# Patient Record
Sex: Female | Born: 1945
Health system: Southern US, Community
[De-identification: ages and names within clinical notes are randomized; demographics above are authoritative.]

## PROBLEM LIST (undated history)

## (undated) DIAGNOSIS — M358 Other specified systemic involvement of connective tissue: Secondary | ICD-10-CM

## (undated) DIAGNOSIS — I4729 Other ventricular tachycardia: Secondary | ICD-10-CM

## (undated) DIAGNOSIS — E785 Hyperlipidemia, unspecified: Secondary | ICD-10-CM

## (undated) DIAGNOSIS — L80 Vitiligo: Secondary | ICD-10-CM

## (undated) DIAGNOSIS — B20 Human immunodeficiency virus [HIV] disease: Secondary | ICD-10-CM

## (undated) DIAGNOSIS — I4510 Unspecified right bundle-branch block: Secondary | ICD-10-CM

## (undated) DIAGNOSIS — S02401A Maxillary fracture, unspecified, initial encounter for closed fracture: Secondary | ICD-10-CM

## (undated) DIAGNOSIS — T783XXA Angioneurotic edema, initial encounter: Secondary | ICD-10-CM

## (undated) DIAGNOSIS — N1832 Chronic kidney disease, stage 3b: Secondary | ICD-10-CM

## (undated) DIAGNOSIS — N390 Urinary tract infection, site not specified: Secondary | ICD-10-CM

## (undated) DIAGNOSIS — J90 Pleural effusion, not elsewhere classified: Secondary | ICD-10-CM

## (undated) DIAGNOSIS — I951 Orthostatic hypotension: Secondary | ICD-10-CM

## (undated) DIAGNOSIS — E1165 Type 2 diabetes mellitus with hyperglycemia: Secondary | ICD-10-CM

## (undated) DIAGNOSIS — L309 Dermatitis, unspecified: Secondary | ICD-10-CM

## (undated) DIAGNOSIS — I071 Rheumatic tricuspid insufficiency: Secondary | ICD-10-CM

## (undated) DIAGNOSIS — Z8669 Personal history of other diseases of the nervous system and sense organs: Secondary | ICD-10-CM

## (undated) DIAGNOSIS — Z889 Allergy status to unspecified drugs, medicaments and biological substances status: Secondary | ICD-10-CM

## (undated) DIAGNOSIS — I63532 Cerebral infarction due to unspecified occlusion or stenosis of left posterior cerebral artery: Secondary | ICD-10-CM

## (undated) DIAGNOSIS — I2510 Atherosclerotic heart disease of native coronary artery without angina pectoris: Secondary | ICD-10-CM

## (undated) DIAGNOSIS — I4892 Unspecified atrial flutter: Secondary | ICD-10-CM

## (undated) DIAGNOSIS — Z8619 Personal history of other infectious and parasitic diseases: Secondary | ICD-10-CM

## (undated) DIAGNOSIS — E1149 Type 2 diabetes mellitus with other diabetic neurological complication: Secondary | ICD-10-CM

## (undated) DIAGNOSIS — I340 Nonrheumatic mitral (valve) insufficiency: Secondary | ICD-10-CM

## (undated) DIAGNOSIS — J189 Pneumonia, unspecified organism: Secondary | ICD-10-CM

## (undated) DIAGNOSIS — I639 Cerebral infarction, unspecified: Secondary | ICD-10-CM

## (undated) DIAGNOSIS — I10 Essential (primary) hypertension: Secondary | ICD-10-CM

## (undated) DIAGNOSIS — L720 Epidermal cyst: Secondary | ICD-10-CM

## (undated) DIAGNOSIS — Z961 Presence of intraocular lens: Secondary | ICD-10-CM

## (undated) DIAGNOSIS — I5030 Unspecified diastolic (congestive) heart failure: Secondary | ICD-10-CM

## (undated) DIAGNOSIS — IMO0001 Reserved for inherently not codable concepts without codable children: Secondary | ICD-10-CM

## (undated) HISTORY — DX: Type 2 diabetes mellitus with hyperglycemia: E11.65

## (undated) HISTORY — DX: Dermatitis, unspecified: L30.9

## (undated) HISTORY — DX: Vitiligo: L80

## (undated) HISTORY — DX: Chronic kidney disease, stage 3b: N18.32

## (undated) HISTORY — DX: Unspecified atrial flutter: I48.92

## (undated) HISTORY — DX: Nonrheumatic mitral (valve) insufficiency: I34.0

## (undated) HISTORY — DX: Personal history of other diseases of the nervous system and sense organs: Z86.69

## (undated) HISTORY — DX: Unspecified right bundle-branch block: I45.10

## (undated) HISTORY — DX: Rheumatic tricuspid insufficiency: I07.1

## (undated) HISTORY — DX: Unspecified diastolic (congestive) heart failure: I50.30

## (undated) HISTORY — DX: Epidermal cyst: L72.0

## (undated) HISTORY — DX: Maxillary fracture, unspecified side, initial encounter for closed fracture: S02.401A

## (undated) HISTORY — PX: CARDIAC SURGERY: SHX584

## (undated) HISTORY — DX: Cerebral infarction due to unspecified occlusion or stenosis of left posterior cerebral artery: I63.532

## (undated) HISTORY — DX: Presence of intraocular lens: Z96.1

## (undated) HISTORY — DX: Cerebral infarction, unspecified: I63.9

## (undated) HISTORY — DX: Hyperlipidemia, unspecified: E78.5

## (undated) HISTORY — DX: Personal history of other infectious and parasitic diseases: Z86.19

## (undated) HISTORY — DX: Other ventricular tachycardia: I47.29

## (undated) HISTORY — DX: Pneumonia, unspecified organism: J18.9

## (undated) HISTORY — DX: Pleural effusion, not elsewhere classified: J90

## (undated) HISTORY — DX: Type 2 diabetes mellitus with other diabetic neurological complication: E11.49

## (undated) HISTORY — DX: Atherosclerotic heart disease of native coronary artery without angina pectoris: I25.10

---
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
## 1997-03-11 HISTORY — PX: ORIF ANKLE FRACTURE: SUR919

## 1998-12-20 ENCOUNTER — Other Ambulatory Visit: Admission: RE | Admit: 1998-12-20 | Discharge: 1998-12-20 | Payer: Self-pay | Admitting: Family Medicine

## 1999-01-09 ENCOUNTER — Encounter: Payer: Self-pay | Admitting: Family Medicine

## 1999-01-09 ENCOUNTER — Encounter: Admission: RE | Admit: 1999-01-09 | Discharge: 1999-01-09 | Payer: Self-pay | Admitting: Family Medicine

## 2000-04-02 ENCOUNTER — Emergency Department (HOSPITAL_COMMUNITY): Admission: EM | Admit: 2000-04-02 | Discharge: 2000-04-02 | Payer: Self-pay | Admitting: Emergency Medicine

## 2003-08-26 ENCOUNTER — Encounter: Admission: RE | Admit: 2003-08-26 | Discharge: 2003-08-26 | Payer: Self-pay | Admitting: Family Medicine

## 2003-09-09 ENCOUNTER — Encounter (INDEPENDENT_AMBULATORY_CARE_PROVIDER_SITE_OTHER): Payer: Self-pay | Admitting: *Deleted

## 2003-09-09 LAB — CONVERTED CEMR LAB

## 2003-09-23 ENCOUNTER — Encounter: Admission: RE | Admit: 2003-09-23 | Discharge: 2003-09-23 | Payer: Self-pay | Admitting: Family Medicine

## 2003-09-28 ENCOUNTER — Encounter: Admission: RE | Admit: 2003-09-28 | Discharge: 2003-09-28 | Payer: Self-pay | Admitting: Sports Medicine

## 2003-11-28 ENCOUNTER — Ambulatory Visit: Payer: Self-pay | Admitting: Family Medicine

## 2006-05-08 DIAGNOSIS — I10 Essential (primary) hypertension: Secondary | ICD-10-CM | POA: Insufficient documentation

## 2006-05-08 DIAGNOSIS — F333 Major depressive disorder, recurrent, severe with psychotic symptoms: Secondary | ICD-10-CM

## 2006-05-08 DIAGNOSIS — N951 Menopausal and female climacteric states: Secondary | ICD-10-CM | POA: Insufficient documentation

## 2006-05-08 DIAGNOSIS — F332 Major depressive disorder, recurrent severe without psychotic features: Secondary | ICD-10-CM | POA: Insufficient documentation

## 2006-05-08 DIAGNOSIS — F331 Major depressive disorder, recurrent, moderate: Secondary | ICD-10-CM | POA: Insufficient documentation

## 2006-05-09 ENCOUNTER — Encounter (INDEPENDENT_AMBULATORY_CARE_PROVIDER_SITE_OTHER): Payer: Self-pay | Admitting: *Deleted

## 2006-08-06 ENCOUNTER — Emergency Department (HOSPITAL_COMMUNITY): Admission: EM | Admit: 2006-08-06 | Discharge: 2006-08-06 | Payer: Self-pay | Admitting: Emergency Medicine

## 2007-09-27 ENCOUNTER — Emergency Department (HOSPITAL_COMMUNITY): Admission: EM | Admit: 2007-09-27 | Discharge: 2007-09-27 | Payer: Self-pay | Admitting: *Deleted

## 2009-07-07 ENCOUNTER — Emergency Department (HOSPITAL_COMMUNITY): Admission: EM | Admit: 2009-07-07 | Discharge: 2009-07-08 | Payer: Self-pay | Admitting: Emergency Medicine

## 2009-07-10 ENCOUNTER — Emergency Department (HOSPITAL_COMMUNITY): Admission: EM | Admit: 2009-07-10 | Discharge: 2009-07-10 | Payer: Self-pay | Admitting: Emergency Medicine

## 2009-07-21 ENCOUNTER — Emergency Department (HOSPITAL_COMMUNITY): Admission: EM | Admit: 2009-07-21 | Discharge: 2009-07-22 | Payer: Self-pay | Admitting: Emergency Medicine

## 2009-10-31 ENCOUNTER — Emergency Department (HOSPITAL_COMMUNITY): Admission: EM | Admit: 2009-10-31 | Discharge: 2009-10-31 | Payer: Self-pay | Admitting: Emergency Medicine

## 2009-11-28 ENCOUNTER — Emergency Department (HOSPITAL_COMMUNITY): Admission: EM | Admit: 2009-11-28 | Discharge: 2009-11-28 | Payer: Self-pay | Admitting: Family Medicine

## 2009-12-10 ENCOUNTER — Emergency Department (HOSPITAL_COMMUNITY): Admission: EM | Admit: 2009-12-10 | Discharge: 2009-12-11 | Payer: Self-pay | Admitting: Emergency Medicine

## 2009-12-31 ENCOUNTER — Emergency Department (HOSPITAL_COMMUNITY): Admission: EM | Admit: 2009-12-31 | Discharge: 2009-12-31 | Payer: Self-pay | Admitting: Emergency Medicine

## 2010-02-15 ENCOUNTER — Emergency Department (HOSPITAL_COMMUNITY): Admission: EM | Admit: 2010-02-15 | Discharge: 2009-06-07 | Payer: Self-pay | Admitting: Emergency Medicine

## 2010-03-11 HISTORY — PX: INTRAOCULAR LENS IMPLANT, SECONDARY: SHX1842

## 2010-05-23 ENCOUNTER — Inpatient Hospital Stay (INDEPENDENT_AMBULATORY_CARE_PROVIDER_SITE_OTHER)
Admission: RE | Admit: 2010-05-23 | Discharge: 2010-05-23 | Disposition: A | Discharge status: Home / Self Care | Payer: Medicare Other | Source: Ambulatory Visit | Attending: Family Medicine | Admitting: Family Medicine

## 2010-05-23 DIAGNOSIS — L989 Disorder of the skin and subcutaneous tissue, unspecified: Secondary | ICD-10-CM

## 2010-05-23 DIAGNOSIS — G43009 Migraine without aura, not intractable, without status migrainosus: Secondary | ICD-10-CM

## 2010-05-29 LAB — BASIC METABOLIC PANEL
BUN: 7 mg/dL (ref 6–23)
BUN: 5 mg/dL — ABNORMAL LOW (ref 6–23)
CO2: 25 mEq/L (ref 19–32)
Chloride: 102 mEq/L (ref 96–112)
Chloride: 107 mEq/L (ref 96–112)
Creatinine, Ser: 0.82 mg/dL (ref 0.4–1.2)
GFR calc Af Amer: >60 mL/min (ref >60)
Potassium: 3.4 mEq/L — ABNORMAL LOW (ref 3.5–5.1)

## 2010-05-29 LAB — CBC
HCT: 41 % (ref 36.0–46.0)
HCT: 42.1 % (ref 36.0–46.0)
MCHC: 34.8 g/dL (ref 30.0–36.0)
MCHC: 35.4 g/dL (ref 30.0–36.0)
MCV: 90.9 fL (ref 78.0–100.0)
MCV: 91.5 fL (ref 78.0–100.0)
MCV: 90.8 fL (ref 78.0–100.0)
Platelets: 204 10*3/uL (ref 150–400)
RBC: 4.51 MIL/uL (ref 3.87–5.11)
RBC: 4.6 MIL/uL (ref 3.87–5.11)
WBC: 13.7 10*3/uL — ABNORMAL HIGH (ref 4.0–10.5)
WBC: 3.5 10*3/uL — ABNORMAL LOW (ref 4.0–10.5)

## 2010-05-29 LAB — URINALYSIS, ROUTINE W REFLEX MICROSCOPIC
Bilirubin Urine: NEGATIVE
Bilirubin Urine: NEGATIVE
Glucose, UA: NEGATIVE mg/dL
Glucose, UA: NEGATIVE mg/dL
Hgb urine dipstick: NEGATIVE
Hgb urine dipstick: NEGATIVE
Ketones, ur: NEGATIVE mg/dL
Ketones, ur: NEGATIVE mg/dL
Nitrite: NEGATIVE
Protein, ur: 30 mg/dL — AB
Specific Gravity, Urine: 1.005 (ref 1.005–1.030)
Urobilinogen, UA: 0.2 mg/dL (ref 0.0–1.0)
pH: 7.5 (ref 5.0–8.0)

## 2010-05-29 LAB — COMPREHENSIVE METABOLIC PANEL
BUN: 7 mg/dL (ref 6–23)
CO2: 27 mEq/L (ref 19–32)
Chloride: 105 mEq/L (ref 96–112)
Creatinine, Ser: 0.72 mg/dL (ref 0.4–1.2)
GFR calc non Af Amer: >60 mL/min (ref >60)
Glucose, Bld: 127 mg/dL — ABNORMAL HIGH (ref 70–99)
Total Bilirubin: 0.4 mg/dL (ref 0.3–1.2)

## 2010-05-29 LAB — DIFFERENTIAL
Basophils Absolute: 0 10*3/uL (ref 0.0–0.1)
Basophils Relative: 1 % (ref 0–1)
Eosinophils Absolute: 0.2 10*3/uL (ref 0.0–0.7)
Eosinophils Absolute: 0.1 10*3/uL (ref 0.0–0.7)
Eosinophils Relative: 1 % (ref 0–5)
Eosinophils Relative: 3 % (ref 0–5)
Lymphocytes Relative: 38 % (ref 12–46)
Lymphs Abs: 2.2 10*3/uL (ref 0.7–4.0)
Monocytes Relative: 7 % (ref 3–12)
Monocytes Relative: 11 % (ref 3–12)
Neutro Abs: 1.6 10*3/uL — ABNORMAL LOW (ref 1.7–7.7)
Neutrophils Relative %: 47 % (ref 43–77)
Neutrophils Relative %: 68 % (ref 43–77)

## 2010-05-29 LAB — URINE MICROSCOPIC-ADD ON

## 2010-05-29 LAB — ROCKY MTN SPOTTED FVR AB, IGM-BLOOD: RMSF IgM: 0.31 IV (ref 0.00–0.89)

## 2010-05-29 LAB — GLUCOSE, CAPILLARY: Glucose-Capillary: 135 mg/dL — ABNORMAL HIGH (ref 70–99)

## 2010-06-02 ENCOUNTER — Emergency Department (HOSPITAL_COMMUNITY)
Admission: EM | Admit: 2010-06-02 | Discharge: 2010-06-02 | Disposition: A | Discharge status: Home / Self Care | Payer: Medicare Other | Attending: Emergency Medicine | Admitting: Emergency Medicine

## 2010-06-02 DIAGNOSIS — I10 Essential (primary) hypertension: Secondary | ICD-10-CM | POA: Insufficient documentation

## 2010-06-02 DIAGNOSIS — R21 Rash and other nonspecific skin eruption: Secondary | ICD-10-CM | POA: Insufficient documentation

## 2010-06-02 DIAGNOSIS — J309 Allergic rhinitis, unspecified: Secondary | ICD-10-CM | POA: Insufficient documentation

## 2010-11-30 ENCOUNTER — Emergency Department (HOSPITAL_COMMUNITY): Payer: Medicare Other

## 2010-11-30 ENCOUNTER — Inpatient Hospital Stay (HOSPITAL_COMMUNITY)
Admission: EM | Admit: 2010-11-30 | Discharge: 2010-12-06 | DRG: 916 | Disposition: A | Discharge status: Home / Self Care | Payer: Medicare Other | Attending: Internal Medicine | Admitting: Internal Medicine

## 2010-11-30 DIAGNOSIS — R51 Headache: Secondary | ICD-10-CM | POA: Diagnosis not present

## 2010-11-30 DIAGNOSIS — I10 Essential (primary) hypertension: Secondary | ICD-10-CM | POA: Diagnosis present

## 2010-11-30 DIAGNOSIS — Z888 Allergy status to other drugs, medicaments and biological substances status: Secondary | ICD-10-CM

## 2010-11-30 DIAGNOSIS — T783XXA Angioneurotic edema, initial encounter: Secondary | ICD-10-CM

## 2010-11-30 DIAGNOSIS — K5900 Constipation, unspecified: Secondary | ICD-10-CM | POA: Diagnosis not present

## 2010-11-30 DIAGNOSIS — Z7982 Long term (current) use of aspirin: Secondary | ICD-10-CM

## 2010-11-30 DIAGNOSIS — X58XXXA Exposure to other specified factors, initial encounter: Secondary | ICD-10-CM

## 2010-11-30 DIAGNOSIS — Z91018 Allergy to other foods: Secondary | ICD-10-CM

## 2010-11-30 DIAGNOSIS — R110 Nausea: Secondary | ICD-10-CM | POA: Diagnosis present

## 2010-11-30 DIAGNOSIS — Z882 Allergy status to sulfonamides status: Secondary | ICD-10-CM

## 2010-11-30 DIAGNOSIS — Z23 Encounter for immunization: Secondary | ICD-10-CM

## 2010-11-30 DIAGNOSIS — Z880 Allergy status to penicillin: Secondary | ICD-10-CM

## 2010-11-30 DIAGNOSIS — R040 Epistaxis: Secondary | ICD-10-CM | POA: Diagnosis not present

## 2010-11-30 DIAGNOSIS — J988 Other specified respiratory disorders: Secondary | ICD-10-CM | POA: Diagnosis present

## 2010-11-30 DIAGNOSIS — Z79899 Other long term (current) drug therapy: Secondary | ICD-10-CM

## 2010-11-30 DIAGNOSIS — I498 Other specified cardiac arrhythmias: Secondary | ICD-10-CM

## 2010-11-30 DIAGNOSIS — R000 Tachycardia, unspecified: Secondary | ICD-10-CM | POA: Diagnosis not present

## 2010-11-30 DIAGNOSIS — E119 Type 2 diabetes mellitus without complications: Secondary | ICD-10-CM | POA: Diagnosis present

## 2010-11-30 LAB — CK TOTAL AND CKMB (NOT AT ARMC): Relative Index: INVALID (ref 0.0–2.5)

## 2010-11-30 LAB — CBC
HCT: 43.2 % (ref 36.0–46.0)
Hemoglobin: 15.7 g/dL — ABNORMAL HIGH (ref 12.0–15.0)
MCV: 85.2 fL (ref 78.0–100.0)
RBC: 5.07 MIL/uL (ref 3.87–5.11)
WBC: 11.1 10*3/uL — ABNORMAL HIGH (ref 4.0–10.5)

## 2010-11-30 LAB — DIFFERENTIAL
Eosinophils Relative: 5 % (ref 0–5)
Lymphocytes Relative: 21 % (ref 12–46)
Lymphs Abs: 2.3 10*3/uL (ref 0.7–4.0)
Neutro Abs: 7.4 10*3/uL (ref 1.7–7.7)
Neutrophils Relative %: 67 % (ref 43–77)

## 2010-11-30 LAB — MRSA PCR SCREENING: MRSA by PCR: NEGATIVE

## 2010-11-30 LAB — BASIC METABOLIC PANEL
CO2: 24 mEq/L (ref 19–32)
Chloride: 102 mEq/L (ref 96–112)
Glucose, Bld: 238 mg/dL — ABNORMAL HIGH (ref 70–99)
Potassium: 3.5 mEq/L (ref 3.5–5.1)
Sodium: 136 mEq/L (ref 135–145)

## 2010-11-30 LAB — GLUCOSE, CAPILLARY
Glucose-Capillary: 353 mg/dL — ABNORMAL HIGH (ref 70–99)
Glucose-Capillary: 323 mg/dL — ABNORMAL HIGH (ref 70–99)

## 2010-11-30 LAB — C-REACTIVE PROTEIN: CRP: 0.48 mg/dL — ABNORMAL LOW (ref <0.60)

## 2010-11-30 LAB — SEDIMENTATION RATE: Sed Rate: 7 mm/hr (ref 0–22)

## 2010-11-30 LAB — POCT I-STAT TROPONIN I

## 2010-12-01 LAB — BASIC METABOLIC PANEL
Calcium: 8.9 mg/dL (ref 8.4–10.5)
Creatinine, Ser: 0.75 mg/dL (ref 0.50–1.10)
GFR calc non Af Amer: >60 mL/min (ref >60)
Glucose, Bld: 328 mg/dL — ABNORMAL HIGH (ref 70–99)
Sodium: 136 mEq/L (ref 135–145)

## 2010-12-01 LAB — GLUCOSE, CAPILLARY
Glucose-Capillary: 310 mg/dL — ABNORMAL HIGH (ref 70–99)
Glucose-Capillary: 282 mg/dL — ABNORMAL HIGH (ref 70–99)
Glucose-Capillary: 337 mg/dL — ABNORMAL HIGH (ref 70–99)

## 2010-12-01 LAB — PHOSPHORUS: Phosphorus: 2.8 mg/dL (ref 2.3–4.6)

## 2010-12-01 LAB — CBC
Hemoglobin: 13.4 g/dL (ref 12.0–15.0)
MCH: 29.6 pg (ref 26.0–34.0)
MCHC: 34.5 g/dL (ref 30.0–36.0)
Platelets: 230 10*3/uL (ref 150–400)
WBC: 15 10*3/uL — ABNORMAL HIGH (ref 4.0–10.5)

## 2010-12-01 LAB — C4 COMPLEMENT: Complement C4, Body Fluid: 22 mg/dL (ref 10–40)

## 2010-12-02 LAB — GLUCOSE, CAPILLARY: Glucose-Capillary: 129 mg/dL — ABNORMAL HIGH (ref 70–99)

## 2010-12-03 LAB — GLUCOSE, CAPILLARY
Glucose-Capillary: 288 mg/dL — ABNORMAL HIGH (ref 70–99)
Glucose-Capillary: 192 mg/dL — ABNORMAL HIGH (ref 70–99)
Glucose-Capillary: 112 mg/dL — ABNORMAL HIGH (ref 70–99)

## 2010-12-04 LAB — GLUCOSE, CAPILLARY: Glucose-Capillary: 151 mg/dL — ABNORMAL HIGH (ref 70–99)

## 2010-12-05 ENCOUNTER — Inpatient Hospital Stay (HOSPITAL_COMMUNITY): Payer: Medicare Other

## 2010-12-05 LAB — GLUCOSE, CAPILLARY: Glucose-Capillary: 95 mg/dL (ref 70–99)

## 2010-12-05 LAB — C1 ESTERASE INHIBITOR, FUNCTIONAL: C1INH Functional/C1INH Total MFr SerPl: 100 % (ref >=68)

## 2010-12-06 DIAGNOSIS — M7989 Other specified soft tissue disorders: Secondary | ICD-10-CM

## 2010-12-06 LAB — GLUCOSE, CAPILLARY
Glucose-Capillary: 196 mg/dL — ABNORMAL HIGH (ref 70–99)
Glucose-Capillary: 128 mg/dL — ABNORMAL HIGH (ref 70–99)

## 2010-12-06 NOTE — Discharge Summary (Addendum)
NAME:  Maria Fox, Maria Fox NO.:  [id number]  MEDICAL RECORD NO.:  [id number]  LOCATION:  5502                         FACILITY:  [facility]  PHYSICIAN:  Lonia Blood, M.D.DATE OF BIRTH:  13-May-1945  DATE OF ADMISSION:  11/30/2010 DATE OF DISCHARGE:                              DISCHARGE SUMMARY   PROJECTED DATE OF DISCHARGE:  December 04, 2010.  PRIMARY CARE PHYSICIAN:  To be assigned at the time of discharge.  DISCHARGE DIAGNOSES: 1. Angioedema/severe acute allergic reaction.     a.     Possibly related to mushrooms/eating pizza.     b.     Symptoms rapidly improved with epinephrine plus      antihistamines plus steroids.     c.     Will need referral to Dr. Maple Hudson in the outpatient setting      for allergy testing to be completed.     d.     EpiPen provided at the time of discharge. 2. Newly diagnosed diabetes mellitus II.     a.     Previously reportedly "borderline diabetic."     b.     Hemoglobin A1c 8.8 during this hospital stay.     c.     Oral therapy initiated and diabetes education ongoing. 3. Newly diagnosed hypertension.     a.     ACE inhibitor not initiated in the setting of acute      angioedema.     b.     Beta-blocker therapy initiated.     c.     Consider ACE inhibitor or ARB and follow-up in the      outpatient setting.  FOLLOWUP: 1. The patient will be referred to a primary care physician at the     time of her discharge as she does not presently have one. 2. The patient will be referred to Dr. Jetty Duhamel at [facility]     Pulmonary for formal allergy testing in the outpatient setting at     the time of her discharge.  PROCEDURES:  None.  CONSULTATIONS:  [facility] - the original admitting service.  HOSPITAL COURSE:  Maria Fox is a very pleasant 65 year old female with no known prior past medical history who does not receive regular medical followup.  The night prior to admission, she went out to  eat with her significant other and consumed a "Supreme Pizza" including mushrooms which she does not usually eat.  She woke the following morning with severe swelling of the face and lips.  She felt as if she was having difficulty with breathing.  She presented to the emergency room.  She was found to be in significant acute angioedema.  She received Benadryl, Decadron, Pepcid, and epinephrine.  Unfortunately she did not require intubation.  She was then admitted to the acute unit under the care of Pulmonary Critical Care Medicine service.  She did well on her initial 24 hours and her facial edema resolved.  She was then transferred to the [facility].  The patient's allergic symptoms rapidly improved and did not recur during her hospital stay. She never progressed to the point of  threatening respiratory compromise again.  She was instructed on use of an EpiPen provided with a prescription for an EpiPen.  She is advised that she will need an outpatient allergy testing and a referral has been made to accomplish this.  During the patient's hospital stay, she was noted to be significantly hyperglycemic.  This was however, in the setting of high-dose steroid administration.  The patient admitted that she had been previously told that she was "borderline diabetic."  She has multiple family members who are diabetic.  Hemoglobin A1c was assessed.  This was found to be 8.8. This of course is not affected by the patient's acute steroid dosing. As a result, we have made a formal diagnosis of diabetes.  A diabetic education has been initiated including nutritional consultation. Glucophage and Glucotrol were initiated during her hospital stay.  She will be provided with prescription for CBG meter and instructed to follow her CBGs on two time a day basis.  She is referred to outpatient diabetes education and case management is currently working on arranging a primary care physician for her.   CBCs are improving nicely as we taper her steroids in the inpatient setting.  During the patient's hospital stay, she was also noted to have hypertension.  Multiple repeat measurements throughout the course of her multi day admission confirm persistent blood pressure elevation.  It should be considered that this is in the setting of high-dose steroids. Nonetheless, it was felt prudent to initiate therapy at this time. Given the patient's recent angioedema, this was felt to be a poor time to initiate ACE inhibitors or ARBs.  As a result, a low-dose beta- blocker has been initiated.  The patient would need to be followed close in the outpatient setting for recheck of her blood pressure.  Once the patient is stable and once her allergy testing is completed, her primary care physician should consider initiating a low-dose ACE inhibitor for its renal protective effects in the setting of diabetes.  At the time of this dictation, the patient is not quite stable for discharge.  She reports she "feels a kind of funny" today.  She specifically denies any stridor or difficulty breathing of feeling like her throat is swelling.  This may simply be a result of her steroid dosing.  At this time, however, it is felt to be prudent to watch her overnight.  If she remained stable throughout the night with a rapid tapering of her steroids, our plan is to discharge her in the morning.  PHYSICAL EXAMINATION:  Physical exam at the time of this dictation, VITAL SIGNS:  Temperature 98.1, blood pressure 145/75, heart rate 65, respiratory rate 70, O2 sat 94% on room air. LUNGS:  Clear to auscultation bilaterally without wheeze or rhonchi. CARDIOVASCULAR:  Regular rate and rhythm without murmur, rub, or gallop. Normal S1 and S2. ABDOMEN:  Nontender, nondistended, soft.  Bowel sounds present.  No organomegaly, rebound or ascites. EXTREMITIES:  There is no significant cyanosis, clubbing, edema in the bilateral  extremities.     Lonia Blood, M.D.     JTM/MEDQ  D:  12/03/2010  T:  12/03/2010  Job:  [id number]  Electronically Signed by Jetty Duhamel M.D. on 12/06/2010 09:53:13 AM Electronically Signed by Jetty Duhamel M.D. on 12/06/2010 09:53:32 AM

## 2010-12-07 LAB — DIFFERENTIAL
Basophils Relative: 2 — ABNORMAL HIGH
Eosinophils Absolute: 0.3
Eosinophils Relative: 4
Lymphs Abs: 2.3
Monocytes Absolute: 0.6
Monocytes Relative: 8
Neutrophils Relative %: 54

## 2010-12-07 LAB — COMPREHENSIVE METABOLIC PANEL
ALT: 14
AST: 17
Albumin: 3.8
Alkaline Phosphatase: 73
Calcium: 9.2
GFR calc Af Amer: >60
Glucose, Bld: 119 — ABNORMAL HIGH
Potassium: 3.4 — ABNORMAL LOW
Sodium: 140
Total Protein: 6.6

## 2010-12-07 LAB — CBC
Hemoglobin: 14.2
MCHC: 35
Platelets: 229
RDW: 13.1

## 2010-12-07 LAB — URINALYSIS, ROUTINE W REFLEX MICROSCOPIC
Bilirubin Urine: NEGATIVE
Ketones, ur: NEGATIVE
Nitrite: NEGATIVE
Urobilinogen, UA: 0.2
pH: 6.5

## 2010-12-14 ENCOUNTER — Emergency Department (HOSPITAL_COMMUNITY)
Admission: EM | Admit: 2010-12-14 | Discharge: 2010-12-14 | Disposition: A | Discharge status: Home / Self Care | Payer: Medicare Other | Attending: Emergency Medicine | Admitting: Emergency Medicine

## 2010-12-14 DIAGNOSIS — G43909 Migraine, unspecified, not intractable, without status migrainosus: Secondary | ICD-10-CM | POA: Insufficient documentation

## 2010-12-14 DIAGNOSIS — Z79899 Other long term (current) drug therapy: Secondary | ICD-10-CM | POA: Insufficient documentation

## 2010-12-18 NOTE — Discharge Summary (Signed)
  NAME:  Maria Fox, Maria Fox                  ACCOUNT NO.:  [id number]  MEDICAL RECORD NO.:  [id number]  LOCATION:  5502                         FACILITY:  [facility]  PHYSICIAN:  Erick Blinks, MD     DATE OF BIRTH:  05/09/1945  DATE OF ADMISSION:  11/30/2010 DATE OF DISCHARGE:  12/06/2010                              DISCHARGE SUMMARY   ADDENDUM  DISCHARGE DIAGNOSES: 1. Angioedema/severe acute allergic reaction. 2. Newly-diagnosed type 2 diabetes. 3. Newly-diagnosed hypertension. 4. Transient nausea, resolved. 5. Headache.  DISCHARGE MEDICATIONS: 1. Metoprolol 12.5 mg p.o. b.i.d. 2. Metformin 500 mg p.o. b.i.d. with meals. 3. Glipizide 10 mg p.o. daily. 4. Enteric-coated aspirin 81 mg p.o. daily. 5. Hydroxyzine 10 mg p.o. daily. 6. Betamethasone topical 0.05%, one application transdermal b.i.d.  INTERIM HOSPITAL COURSE:  The patient's remainder of the hospital course has remained uneventful.  She had, had transient nausea as well as an episode of epistaxis, which resolved spontaneously.  The patient is complaining of some swelling in her right hand today with some pain. This has been occurring since the past 24 hours.  We will check an ultrasound of the upper extremity to rule out any underlying DVT.  If this is negative, she can likely be discharged home to continue further outpatient care.  DISCHARGE INSTRUCTIONS:  The patient already has an appointment to follow up with Dr. Maple Hudson for further allergy testing.  She has been given number for [facility] to schedule an appointment with her primary care doctor of her choice.  She has been advised to continue on a low-salt, low-calorie diet.  She has been set up with outpatient diabetes education.  Activity should be as tolerated.     Erick Blinks, MD     JM/MEDQ  D:  12/06/2010  T:  12/06/2010  Job:  [id number]  Electronically Signed by Durward Mallard Taber Sweetser  on 12/18/2010 10:49:13 PM

## 2011-01-10 ENCOUNTER — Institutional Professional Consult (permissible substitution): Payer: Medicare Other | Admitting: Internal Medicine

## 2011-01-20 ENCOUNTER — Encounter (HOSPITAL_COMMUNITY): Payer: Self-pay | Admitting: *Deleted

## 2011-01-20 ENCOUNTER — Emergency Department (HOSPITAL_COMMUNITY)
Admission: EM | Admit: 2011-01-20 | Discharge: 2011-01-20 | Disposition: A | Discharge status: Home / Self Care | Payer: Medicare Other | Attending: Emergency Medicine | Admitting: Emergency Medicine

## 2011-01-20 DIAGNOSIS — M79609 Pain in unspecified limb: Secondary | ICD-10-CM | POA: Insufficient documentation

## 2011-01-20 DIAGNOSIS — E119 Type 2 diabetes mellitus without complications: Secondary | ICD-10-CM | POA: Insufficient documentation

## 2011-01-20 DIAGNOSIS — L0390 Cellulitis, unspecified: Secondary | ICD-10-CM

## 2011-01-20 DIAGNOSIS — I10 Essential (primary) hypertension: Secondary | ICD-10-CM | POA: Insufficient documentation

## 2011-01-20 DIAGNOSIS — IMO0002 Reserved for concepts with insufficient information to code with codable children: Secondary | ICD-10-CM | POA: Insufficient documentation

## 2011-01-20 DIAGNOSIS — R21 Rash and other nonspecific skin eruption: Secondary | ICD-10-CM

## 2011-01-20 DIAGNOSIS — Z9889 Other specified postprocedural states: Secondary | ICD-10-CM | POA: Insufficient documentation

## 2011-01-20 HISTORY — DX: Essential (primary) hypertension: I10

## 2011-01-20 HISTORY — DX: Allergy status to unspecified drugs, medicaments and biological substances: Z88.9

## 2011-01-20 MED ORDER — CEPHALEXIN 500 MG PO CAPS: 500.0000 mg | ORAL_CAPSULE | Freq: Four times a day (QID) | ORAL | Status: AC

## 2011-01-20 MED ORDER — PREDNISONE 20 MG PO TABS
60.0000 mg | ORAL_TABLET | Freq: Once | ORAL | Status: AC
  Administered 2011-01-20: 60 mg via ORAL

## 2011-01-20 MED ORDER — HYDROXYZINE HCL 25 MG PO TABS: 25.0000 mg | ORAL_TABLET | Freq: Four times a day (QID) | ORAL | Status: AC

## 2011-01-20 MED ORDER — PREDNISONE 20 MG PO TABS: 40.0000 mg | ORAL_TABLET | Freq: Every day | ORAL | Status: AC

## 2011-01-20 MED ORDER — BETAMETHASONE DIPROPIONATE 0.05 % EX OINT: TOPICAL_OINTMENT | Freq: Two times a day (BID) | CUTANEOUS | Status: DC

## 2011-01-20 MED ORDER — DIPHENHYDRAMINE HCL 50 MG/ML IJ SOLN
50.0000 mg | Freq: Once | INTRAMUSCULAR | Status: AC
  Administered 2011-01-20: 50 mg via INTRAMUSCULAR
  Filled 2011-01-20: qty 1

## 2011-01-20 MED ORDER — PREDNISONE 20 MG PO TABS
ORAL_TABLET | ORAL | Status: AC
  Administered 2011-01-20: 60 mg via ORAL
  Filled 2011-01-20: qty 3

## 2011-01-20 NOTE — ED Provider Notes (Signed)
Medical screening examination/treatment/procedure(s) were performed by non-physician practitioner and as supervising physician I was immediately available for consultation/collaboration.   Nicoli Nardozzi D Shahil Speegle, MD 01/20/11 0825 

## 2011-01-20 NOTE — ED Provider Notes (Signed)
History     CSN: [id number] Arrival date & time: 01/20/2011  4:19 AM   First MD Initiated Contact with Patient 01/20/11 0431      Chief Complaint  Patient presents with  . Rash    HPI: Patient is a 65 y.o. female presenting with rash. The history is provided by the patient. No language interpreter was used.  Rash  This is a chronic problem. The current episode started more than 1 week ago. The problem has been rapidly worsening. The problem is associated with nothing. There has been no fever. The rash is present on the torso, right arm, left arm, right upper leg, right lower leg, left upper leg and left lower leg. The pain is at a severity of 10/10. The pain is severe.  Reports intermittent rash x 1 yr. Gets better w/ meds and returns. Returned this time and she was out of the only meds that have helped at all. Has seen mx dermatologist for same w/o definitive dx.  Past Medical History  Diagnosis Date  . Hypertension   . Diabetes mellitus   . Multiple allergies     Past Surgical History  Procedure Date  . Tonsillectomy 1958  . Orthopedic surgery     Family History  Problem Relation Age of Onset  . Hypertension Brother   . Hyperlipidemia Brother   . Asthma Brother   . Throat cancer Brother   . Diabetes type II Mother   . Hypertension Mother   . Hyperlipidemia Mother     History  Substance Use Topics  . Smoking status: Never Smoker   . Smokeless tobacco: Not on file  . Alcohol Use: No    OB History    Grav Para Term Preterm Abortions TAB SAB Ect Mult Living                  Review of Systems  Constitutional: Negative.   HENT: Negative.   Eyes: Negative.   Respiratory: Negative.   Cardiovascular: Negative.   Gastrointestinal: Negative.   Genitourinary: Negative.   Musculoskeletal: Negative.   Skin: Positive for rash.  Neurological: Negative.   Hematological: Negative.   Psychiatric/Behavioral: Negative.     Allergies  Review of patient's allergies  indicates not on file.  Home Medications  No current outpatient prescriptions on file.  BP 136/76  Pulse 96  Temp(Src) 96.8 F (36 C) (Oral)  Resp 18  Ht 5\' 2"  (1.575 m)  Wt 140 lb (63.504 kg)  BMI 25.61 kg/m2  SpO2 95%  Physical Exam  Constitutional: She is oriented to person, place, and time. She appears well-developed and well-nourished.  HENT:  Head: Normocephalic and atraumatic.  Eyes: Conjunctivae are normal.  Neck: Normal range of motion.  Cardiovascular: Normal rate.   Pulmonary/Chest: Effort normal.  Abdominal:       Sparsely scatteredcrusty lesions on abd as well  Musculoskeletal: Normal range of motion.  Neurological: She is alert and oriented to person, place, and time. She has normal reflexes.  Skin: Skin is warm and dry. Rash noted.       Scattered crusty lesions to BUE; BLE, abd and back. Most significant area on (L) posetrior FA w/ very erythematous base c/w early cellulitis.  Psychiatric: She has a normal mood and affect.    ED Course  Procedures Given Benadryl IM for itching, prednisone.  Labs Reviewed - No data to display No results found.   No diagnosis found.    MDM    Nonspecific chronic  rash w/ early cellulitis to (L) posterior FA. Will tx w/ oral steroids, abx and refill Hydroxyzine andDiprolene cream.       Roma Kayser Ailynn Gow, NP 01/20/11 4540  Roma Kayser Francetta Ilg, NP 01/20/11 [phone]

## 2011-01-20 NOTE — ED Notes (Signed)
Patient has coarse rash to her arms, legs, and abd/back.  She states she is now seeing rash in her vaginal area.  Patient was seen recently and told she has psoriasis.  Patient states the cream helped.  She does not believe this is psoriasis.

## 2011-02-28 ENCOUNTER — Institutional Professional Consult (permissible substitution): Payer: Medicare Other | Admitting: Internal Medicine

## 2011-03-04 ENCOUNTER — Emergency Department (HOSPITAL_COMMUNITY)
Admission: EM | Admit: 2011-03-04 | Discharge: 2011-03-05 | Disposition: A | Discharge status: Home / Self Care | Payer: Medicare Other | Attending: Emergency Medicine | Admitting: Emergency Medicine

## 2011-03-04 ENCOUNTER — Encounter (HOSPITAL_COMMUNITY): Payer: Self-pay | Admitting: *Deleted

## 2011-03-04 DIAGNOSIS — L408 Other psoriasis: Secondary | ICD-10-CM | POA: Insufficient documentation

## 2011-03-04 DIAGNOSIS — L509 Urticaria, unspecified: Secondary | ICD-10-CM | POA: Insufficient documentation

## 2011-03-04 DIAGNOSIS — Z79899 Other long term (current) drug therapy: Secondary | ICD-10-CM | POA: Insufficient documentation

## 2011-03-04 DIAGNOSIS — R21 Rash and other nonspecific skin eruption: Secondary | ICD-10-CM | POA: Insufficient documentation

## 2011-03-04 DIAGNOSIS — I10 Essential (primary) hypertension: Secondary | ICD-10-CM | POA: Insufficient documentation

## 2011-03-04 DIAGNOSIS — E119 Type 2 diabetes mellitus without complications: Secondary | ICD-10-CM | POA: Insufficient documentation

## 2011-03-04 DIAGNOSIS — L2989 Other pruritus: Secondary | ICD-10-CM | POA: Insufficient documentation

## 2011-03-04 DIAGNOSIS — L409 Psoriasis, unspecified: Secondary | ICD-10-CM

## 2011-03-04 DIAGNOSIS — L298 Other pruritus: Secondary | ICD-10-CM | POA: Insufficient documentation

## 2011-03-04 LAB — GLUCOSE, CAPILLARY: Glucose-Capillary: 199 mg/dL — ABNORMAL HIGH (ref 70–99)

## 2011-03-04 NOTE — ED Provider Notes (Signed)
Complains of pruritic rash for several years. patient reports having been told by 2 neurologists that she has psoriasis. On exam alert nontoxic Glasgow Coma Score 15 psoriatic rash on trunk arms and perineum. Rash has not appeared infected.  Doug Sou, MD 03/05/11 0000

## 2011-03-04 NOTE — ED Notes (Signed)
Pt complaining of rash in genital area. Pt states rash will not go away even with cream. Pt denies urinary problems.

## 2011-03-04 NOTE — ED Notes (Signed)
Patient with rash all over her body.  Patient has the rash on her arms, lower legs, private area, and her back.  Has been there for some time

## 2011-03-05 MED ORDER — HYDROCORTISONE 1 % EX CREA
TOPICAL_CREAM | Freq: Three times a day (TID) | CUTANEOUS | Status: DC
  Administered 2011-03-05: 02:00:00 via TOPICAL
  Filled 2011-03-05: qty 28

## 2011-03-05 MED ORDER — HYDROXYZINE HCL 25 MG PO TABS: 25.0000 mg | ORAL_TABLET | Freq: Four times a day (QID) | ORAL | Status: DC

## 2011-03-05 NOTE — ED Provider Notes (Signed)
History     CSN: [id number]  Arrival date & time 03/04/11  2203   First MD Initiated Contact with Patient 03/04/11 2250      Chief Complaint  Patient presents with  . Rash    (Consider location/radiation/quality/duration/timing/severity/associated sxs/prior treatment) HPI Comments: Patient here with chronic pruritic rash that has worsened over the past several days - has sen at least 2 dermatologist who both tell the patient that this is psoriasis - she states she does not believe this to be so - she has tried multiple different medications without relief -   Patient is a 65 y.o. female presenting with rash. The history is provided by the patient. No language interpreter was used.  Rash  This is a chronic problem. The current episode started more than 1 week ago. The problem has not changed since onset.The problem is associated with nothing. There has been no fever. The rash is present on the back, abdomen, groin, genitalia, left arm, right arm, right lower leg and left lower leg. The pain is at a severity of 2/10. The pain is mild. The pain has been constant since onset. Associated symptoms include itching. Pertinent negatives include no weeping. She has tried antihistamines and steriods for the symptoms. The treatment provided no relief.    Past Medical History  Diagnosis Date  . Hypertension   . Diabetes mellitus   . Multiple allergies     Past Surgical History  Procedure Date  . Tonsillectomy 1958  . Orthopedic surgery     Family History  Problem Relation Age of Onset  . Hypertension Brother   . Hyperlipidemia Brother   . Asthma Brother   . Throat cancer Brother   . Diabetes type II Mother   . Hypertension Mother   . Hyperlipidemia Mother     History  Substance Use Topics  . Smoking status: Never Smoker   . Smokeless tobacco: Not on file  . Alcohol Use: No    OB History    Grav Para Term Preterm Abortions TAB SAB Ect Mult Living                  Review  of Systems  Skin: Positive for itching and rash.  All other systems reviewed and are negative.    Allergies  Codeine; Penicillins; and Sulfa antibiotics  Home Medications   Current Outpatient Rx  Name Route Sig Dispense Refill  . ACETAMINOPHEN 500 MG PO TABS Oral Take 500 mg by mouth every 6 (six) hours as needed. Pain     . BETAMETHASONE DIPROPIONATE AUG 0.05 % EX GEL Topical Apply 1 application topically 2 (two) times daily.      Marland Kitchen EPINEPHRINE 0.3 MG/0.3ML IJ DEVI Intramuscular Inject 0.3 mg into the muscle once. For allergic reaction     . GLIPIZIDE 10 MG PO TABS Oral Take 10 mg by mouth every morning.      Marland Kitchen HYDROXYZINE HCL 50 MG PO TABS Oral Take 50 mg by mouth every 6 (six) hours as needed. For itching     . IBUPROFEN 200 MG PO TABS Oral Take 200 mg by mouth every 6 (six) hours as needed. Pain     . METFORMIN HCL 500 MG PO TABS Oral Take 500 mg by mouth 2 (two) times daily with a meal.      . METOPROLOL TARTRATE 25 MG PO TABS Oral Take 25 mg by mouth 2 (two) times daily.      Marland Kitchen ONDANSETRON HCL 4 MG  PO TABS Oral Take 4 mg by mouth every 6 (six) hours as needed. For nausea     . PREDNISONE 20 MG PO TABS Oral Take 40 mg by mouth daily.        BP 149/72  Pulse 97  Temp(Src) 97.5 F (36.4 C) (Oral)  Resp 16  Ht 5\' 2"  (1.575 m)  Wt 145 lb (65.772 kg)  BMI 26.52 kg/m2  SpO2 97%  Physical Exam  Nursing note and vitals reviewed. Constitutional: She is oriented to person, place, and time. She appears well-developed and well-nourished. No distress.  HENT:  Head: Normocephalic and atraumatic.  Right Ear: External ear normal.  Left Ear: External ear normal.  Mouth/Throat: Oropharynx is clear and moist. No oropharyngeal exudate.  Eyes: Conjunctivae are normal. Pupils are equal, round, and reactive to light. No scleral icterus.  Neck: Normal range of motion. Neck supple.  Cardiovascular: Normal rate, regular rhythm and normal heart sounds.  Exam reveals no gallop and no friction  rub.   No murmur heard. Pulmonary/Chest: Effort normal and breath sounds normal. No respiratory distress. She exhibits no tenderness.  Abdominal: Soft. Bowel sounds are normal. She exhibits no distension. There is no tenderness.  Musculoskeletal: Normal range of motion.  Lymphadenopathy:    She has no cervical adenopathy.  Neurological: She is alert and oriented to person, place, and time. No cranial nerve deficit.  Skin: Rash noted. Rash is urticarial. There is erythema.          Multiple area of plaque like rash, scale noted to bilateral forearms and bilateral labia, papular rash to back and abdomen with same and erythema to bilateral anterior lower extremities  Psychiatric: She has a normal mood and affect. Her behavior is normal. Judgment and thought content normal.    ED Course  Procedures (including critical care time)  Labs Reviewed  GLUCOSE, CAPILLARY - Abnormal; Notable for the following:    Glucose-Capillary 199 (*)    All other components within normal limits  POCT CBG MONITORING   No results found.   Psoriasis    MDM  Patient returns with worsening of psoriasis without signs of infection to the lower extremities - has been on atarax in the past and would like to try this again - have ordered hydrocortisone cream as well - advised patient against being placed on oral steroids and NSAIDS again due to the risk of renal impairment and GI bleed.        Izola Price [place], [place] 03/05/11 [phone]

## 2011-03-05 NOTE — ED Provider Notes (Signed)
Medical screening examination/treatment/procedure(s) were conducted as a shared visit with non-physician practitioner(s) and myself.  I personally evaluated the patient during the encounter  Doug Sou, MD 03/05/11 [phone]

## 2011-03-14 ENCOUNTER — Emergency Department (HOSPITAL_COMMUNITY)
Admission: EM | Admit: 2011-03-14 | Discharge: 2011-03-15 | Disposition: A | Discharge status: Home / Self Care | Payer: Medicare Other | Attending: Emergency Medicine | Admitting: Emergency Medicine

## 2011-03-14 DIAGNOSIS — R21 Rash and other nonspecific skin eruption: Secondary | ICD-10-CM | POA: Diagnosis not present

## 2011-03-14 DIAGNOSIS — I10 Essential (primary) hypertension: Secondary | ICD-10-CM | POA: Insufficient documentation

## 2011-03-14 DIAGNOSIS — R000 Tachycardia, unspecified: Secondary | ICD-10-CM | POA: Insufficient documentation

## 2011-03-14 DIAGNOSIS — E119 Type 2 diabetes mellitus without complications: Secondary | ICD-10-CM | POA: Insufficient documentation

## 2011-03-14 DIAGNOSIS — Z79899 Other long term (current) drug therapy: Secondary | ICD-10-CM | POA: Insufficient documentation

## 2011-03-14 DIAGNOSIS — L299 Pruritus, unspecified: Secondary | ICD-10-CM | POA: Insufficient documentation

## 2011-03-14 HISTORY — DX: Angioneurotic edema, initial encounter: T78.3XXA

## 2011-03-15 ENCOUNTER — Encounter (HOSPITAL_COMMUNITY): Payer: Self-pay | Admitting: Emergency Medicine

## 2011-03-15 DIAGNOSIS — R21 Rash and other nonspecific skin eruption: Secondary | ICD-10-CM | POA: Diagnosis not present

## 2011-03-15 DIAGNOSIS — R000 Tachycardia, unspecified: Secondary | ICD-10-CM | POA: Diagnosis not present

## 2011-03-15 DIAGNOSIS — Z79899 Other long term (current) drug therapy: Secondary | ICD-10-CM | POA: Diagnosis not present

## 2011-03-15 DIAGNOSIS — I10 Essential (primary) hypertension: Secondary | ICD-10-CM | POA: Diagnosis not present

## 2011-03-15 DIAGNOSIS — E119 Type 2 diabetes mellitus without complications: Secondary | ICD-10-CM | POA: Diagnosis not present

## 2011-03-15 DIAGNOSIS — L299 Pruritus, unspecified: Secondary | ICD-10-CM | POA: Diagnosis not present

## 2011-03-15 MED ORDER — PREDNISONE 10 MG PO TABS: 20.0000 mg | ORAL_TABLET | Freq: Every day | ORAL | Status: DC

## 2011-03-15 MED ORDER — HYDROXYZINE HCL 50 MG/ML IM SOLN
50.0000 mg | Freq: Once | INTRAMUSCULAR | Status: AC
  Administered 2011-03-15: 50 mg via INTRAMUSCULAR
  Filled 2011-03-15: qty 1

## 2011-03-15 NOTE — ED Notes (Signed)
Pt st's she has a rash all over her body for several months, st's it's getting worse and worse.  Pt was given betamethasone cream in September, st's it doesn't help.  She has seen a dermatologist and they told her they think it's psoriasis.

## 2011-03-15 NOTE — ED Provider Notes (Signed)
History     CSN: [id number]  Arrival date & time 03/14/11  2335   First MD Initiated Contact with Patient 03/15/11 0139      Chief Complaint  Patient presents with  . Rash    (Consider location/radiation/quality/duration/timing/severity/associated sxs/prior treatment) Patient is a 66 y.o. female presenting with rash. The history is provided by the patient.  Rash    patient here with rash to her back and lower extremities for the past 10 months. Patient has been seen by dermatology x2 for same and no diagnosis made. Patient notes that she has pruritus with it and burning. Denies any fever. No progression of the rash. Nothing makes her symptoms better or worse. No medications used prior to arrival  Past Medical History  Diagnosis Date  . Hypertension   . Diabetes mellitus   . Multiple allergies   . Angioedema     Past Surgical History  Procedure Date  . Tonsillectomy 1958  . Orthopedic surgery     Family History  Problem Relation Age of Onset  . Hypertension Brother   . Hyperlipidemia Brother   . Asthma Brother   . Throat cancer Brother   . Diabetes type II Mother   . Hypertension Mother   . Hyperlipidemia Mother     History  Substance Use Topics  . Smoking status: Never Smoker   . Smokeless tobacco: Not on file  . Alcohol Use: No    OB History    Grav Para Term Preterm Abortions TAB SAB Ect Mult Living                  Review of Systems  Skin: Positive for rash.  All other systems reviewed and are negative.    Allergies  Codeine; Penicillins; and Sulfa antibiotics  Home Medications   Current Outpatient Rx  Name Route Sig Dispense Refill  . ACETAMINOPHEN 500 MG PO TABS Oral Take 500 mg by mouth every 6 (six) hours as needed. Pain     . BETAMETHASONE DIPROPIONATE AUG 0.05 % EX GEL Topical Apply 1 application topically 2 (two) times daily.      Marland Kitchen GLIPIZIDE 10 MG PO TABS Oral Take 10 mg by mouth every morning.      Marland Kitchen HYDROXYZINE HCL 50 MG PO TABS  Oral Take 50 mg by mouth every 6 (six) hours as needed. For itching     . IBUPROFEN 200 MG PO TABS Oral Take 200 mg by mouth every 6 (six) hours as needed. Pain     . METFORMIN HCL 500 MG PO TABS Oral Take 500 mg by mouth 2 (two) times daily with a meal.      . METOPROLOL TARTRATE 25 MG PO TABS Oral Take 25 mg by mouth 2 (two) times daily.      Marland Kitchen EPINEPHRINE 0.3 MG/0.3ML IJ DEVI Intramuscular Inject 0.3 mg into the muscle once. For allergic reaction     . ONDANSETRON HCL 4 MG PO TABS Oral Take 4 mg by mouth every 6 (six) hours as needed. For nausea       BP 156/81  Pulse 111  Temp(Src) 98.2 F (36.8 C) (Oral)  Resp 20  Wt 145 lb (65.772 kg)  SpO2 97%  Physical Exam  Nursing note and vitals reviewed. Constitutional: She is oriented to person, place, and time. She appears well-developed and well-nourished.  Non-toxic appearance.  HENT:  Head: Normocephalic and atraumatic.  Eyes: Conjunctivae are normal. Pupils are equal, round, and reactive to light.  Neck: Normal range of motion.  Cardiovascular: Tachycardia present.   Pulmonary/Chest: Effort normal.  Neurological: She is alert and oriented to person, place, and time.  Skin: Skin is warm and dry. Rash noted. No petechiae and no purpura noted. Rash is pustular. Rash is not papular, not nodular and not urticarial.  Psychiatric: She has a normal mood and affect.    ED Course  Procedures (including critical care time)  Labs Reviewed - No data to display No results found.   No diagnosis found.    MDM  She given dose of IM Vistaril here. Will place on short prednisone taper and she will followup with dermatology at [facility]        Toy Baker, MD 03/15/11 [phone]

## 2011-03-15 NOTE — ED Notes (Signed)
Pt is c/o rash on her back and her legs, arms  Pt states she has had it for a while now  Pt states it burns and hurts  Pt states it is getting worse  Pt states she has been to a dermatologist  But it continues to get worse

## 2011-05-27 ENCOUNTER — Encounter (HOSPITAL_COMMUNITY): Payer: Self-pay | Admitting: *Deleted

## 2011-05-27 ENCOUNTER — Observation Stay (HOSPITAL_COMMUNITY)
Admission: EM | Admit: 2011-05-27 | Discharge: 2011-05-27 | Disposition: A | Discharge status: Home / Self Care | Payer: Medicare Other | Attending: Emergency Medicine | Admitting: Emergency Medicine

## 2011-05-27 DIAGNOSIS — T783XXA Angioneurotic edema, initial encounter: Principal | ICD-10-CM | POA: Insufficient documentation

## 2011-05-27 DIAGNOSIS — T7840XA Allergy, unspecified, initial encounter: Secondary | ICD-10-CM

## 2011-05-27 DIAGNOSIS — I10 Essential (primary) hypertension: Secondary | ICD-10-CM | POA: Insufficient documentation

## 2011-05-27 DIAGNOSIS — E119 Type 2 diabetes mellitus without complications: Secondary | ICD-10-CM | POA: Diagnosis not present

## 2011-05-27 DIAGNOSIS — X58XXXA Exposure to other specified factors, initial encounter: Secondary | ICD-10-CM | POA: Insufficient documentation

## 2011-05-27 LAB — POCT I-STAT, CHEM 8
Creatinine, Ser: 0.8 mg/dL (ref 0.50–1.10)
HCT: 40 % (ref 36.0–46.0)
Hemoglobin: 13.6 g/dL (ref 12.0–15.0)
Potassium: 3.9 mEq/L (ref 3.5–5.1)
Sodium: 139 mEq/L (ref 135–145)
TCO2: 25 mmol/L (ref 0–100)

## 2011-05-27 MED ORDER — EPINEPHRINE 0.3 MG/0.3ML IJ DEVI
0.3000 mg | Freq: Once | INTRAMUSCULAR | Status: AC
  Administered 2011-05-27: 0.3 mg via INTRAMUSCULAR
  Filled 2011-05-27: qty 0.3

## 2011-05-27 MED ORDER — FAMOTIDINE 20 MG PO TABS
ORAL_TABLET | ORAL | Status: AC
  Filled 2011-05-27: qty 1

## 2011-05-27 MED ORDER — PREDNISONE 20 MG PO TABS
60.0000 mg | ORAL_TABLET | Freq: Once | ORAL | Status: AC
  Administered 2011-05-27: 60 mg via ORAL
  Filled 2011-05-27: qty 3

## 2011-05-27 MED ORDER — EPINEPHRINE 0.3 MG/0.3ML IJ DEVI: 0.3000 mg | INTRAMUSCULAR | Status: DC | PRN

## 2011-05-27 MED ORDER — DIPHENHYDRAMINE HCL 50 MG/ML IJ SOLN
25.0000 mg | Freq: Four times a day (QID) | INTRAMUSCULAR | Status: DC | PRN
  Administered 2011-05-27: 50 mg via INTRAVENOUS
  Filled 2011-05-27: qty 1

## 2011-05-27 MED ORDER — DIPHENHYDRAMINE HCL 50 MG/ML IJ SOLN
50.0000 mg | Freq: Once | INTRAMUSCULAR | Status: AC
  Administered 2011-05-27: 50 mg via INTRAVENOUS
  Filled 2011-05-27: qty 1

## 2011-05-27 MED ORDER — FAMOTIDINE 20 MG PO TABS
20.0000 mg | ORAL_TABLET | Freq: Once | ORAL | Status: AC
  Administered 2011-05-27: 20 mg via ORAL

## 2011-05-27 MED ORDER — FAMOTIDINE IN NACL 20-0.9 MG/50ML-% IV SOLN: 20.0000 mg | Freq: Two times a day (BID) | INTRAVENOUS | Status: DC

## 2011-05-27 NOTE — ED Notes (Signed)
The pt has multiple complaints she has had a virus and this am her tongue was swollen but that went away.  She has a generalized rash and and she has chest congestion.

## 2011-05-27 NOTE — ED Provider Notes (Signed)
Pt seen with NP She is here for angioedema of lips, No tongue swelling noted She has h/o similar type reaction in past Will give epi/benadryl/steroids Will place on CDU protocol No stridor, voice normal  Fam hx - positive for CAD  Joya Gaskins, MD 05/27/11 [phone]

## 2011-05-27 NOTE — ED Notes (Signed)
DR Adriana Simas HAS BEEN IN TO [facility] FOR DISCHARGE HOME. PT IS CONCERNED ABOUT EATING DINNER HERE BEFORE [facility] GOES HOME

## 2011-05-27 NOTE — ED Provider Notes (Signed)
History     CSN: [id number]  Arrival date & time 05/27/11  Rich Fuchs   First MD Initiated Contact with Patient 05/27/11 [phone]      Chief Complaint  Patient presents with  . Nasal Congestion     Patient is a 66 y.o. female presenting with allergic reaction. The history is provided by the patient.  Allergic Reaction The primary symptoms are  angioedema. The primary symptoms do not include wheezing or shortness of breath. The current episode started 13 to 24 hours ago. The problem has been gradually improving. This is a recurrent problem.  The angioedema is not associated with shortness of breath.   Pt reports she awoke yesterday morning with a swollen tongue. This seemed to resolve over the course of the day. At approximately 9 clock last night patient noted swelling to right upper lip. Denies any new exposures. Patient with history of similar reactions in the past and has been diagnosed with angioedema. States that she has been prescribed an EpiPen but did not have her EpiPen with her. She denies having any difficulty breathing at any point or any other associated symptoms. Patient does have a rash to bilateral anterior forearms which is chronic.  Past Medical History  Diagnosis Date  . Hypertension   . Diabetes mellitus   . Multiple allergies   . Angioedema     Past Surgical History  Procedure Date  . Tonsillectomy 1958  . Orthopedic surgery     Family History  Problem Relation Age of Onset  . Hypertension Brother   . Hyperlipidemia Brother   . Asthma Brother   . Throat cancer Brother   . Diabetes type II Mother   . Hypertension Mother   . Hyperlipidemia Mother     History  Substance Use Topics  . Smoking status: Never Smoker   . Smokeless tobacco: Not on file  . Alcohol Use: No    OB History    Grav Para Term Preterm Abortions TAB SAB Ect Mult Living                  Review of Systems  Constitutional: Negative.   HENT: Negative.   Eyes: Negative.     Respiratory: Negative.  Negative for shortness of breath and wheezing.   Cardiovascular: Negative.   Gastrointestinal: Negative.   Genitourinary: Negative.   Musculoskeletal: Negative.   Skin: Negative.   Neurological: Negative.   Hematological: Negative.   Psychiatric/Behavioral: Negative.     Allergies  Codeine; Penicillins; and Sulfa antibiotics  Home Medications   Current Outpatient Rx  Name Route Sig Dispense Refill  . ACETAMINOPHEN 500 MG PO TABS Oral Take 500 mg by mouth every 6 (six) hours as needed. Pain     . BETAMETHASONE DIPROPIONATE AUG 0.05 % EX GEL Topical Apply 1 application topically 2 (two) times daily.      Marland Kitchen GLIPIZIDE 10 MG PO TABS Oral Take 10 mg by mouth every morning.      Marland Kitchen HYDROXYZINE HCL 50 MG PO TABS Oral Take 50 mg by mouth every 6 (six) hours as needed. For itching     . IBUPROFEN 200 MG PO TABS Oral Take 200 mg by mouth every 6 (six) hours as needed. Pain     . METFORMIN HCL 500 MG PO TABS Oral Take 500 mg by mouth 2 (two) times daily with a meal.      . METOPROLOL TARTRATE 25 MG PO TABS Oral Take 25 mg by mouth 2 (two) times  daily.      Marland Kitchen ONDANSETRON HCL 4 MG PO TABS Oral Take 4 mg by mouth every 6 (six) hours as needed. For nausea     . EPINEPHRINE 0.3 MG/0.3ML IJ DEVI Intramuscular Inject 0.3 mg into the muscle once. For allergic reaction       BP 164/87  Pulse 107  Temp(Src) 98.2 F (36.8 C) (Oral)  Resp 22  SpO2 97%  Physical Exam  Constitutional: She is oriented to person, place, and time. She appears well-developed and well-nourished.  HENT:  Head: Normocephalic and atraumatic.  Mouth/Throat:         Mild swelling to right upper lip  Eyes: Conjunctivae are normal.  Neck: Neck supple.  Cardiovascular: Normal rate and regular rhythm.   Pulmonary/Chest: Effort normal and breath sounds normal.  Abdominal: Soft. Bowel sounds are normal.  Musculoskeletal: Normal range of motion.  Neurological: She is alert and oriented to person,  place, and time.  Skin: Skin is warm and dry. No erythema.  Psychiatric: She has a normal mood and affect.    ED Course  Procedures   I have discussed patient with Dr. Bebe Shaggy who has also seen and examined patient. Will plan to place patient in CDU under "CDU Allergic Reaction Protocol for continued observation and reevaluation. I discussed this plan with the patient and she is agreeable.  Labs Reviewed  POCT I-STAT, CHEM 8 - Abnormal; Notable for the following:    Glucose, Bld 232 (*)    All other components within normal limits   No results found.   No diagnosis found.    MDM  HPI/PE and clinical findings c/w 1. Angiedema  Will place in CDU under the "CDU Allergic Reaction Protocol" for continued observation and reevaluation.        Leanne Chang, NP 05/27/11 [phone]

## 2011-05-27 NOTE — Discharge Instructions (Signed)
Allergic Reaction, Mild to Moderate   Allergies may happen from anything your body is sensitive to. This may be food, medications, pollens, chemicals, and nearly anything around you in everyday life that produces allergens. An allergen is anything that causes an allergy producing substance. Allergens cause your body to release allergic antibodies. Through a chain of events, they cause a release of histamine into the blood stream. Histamines are meant to protect you, but they also cause your discomfort. This is why antihistamines are often used for allergies. Heredity is often a factor in causing allergic reactions. This means you may have some of the same allergies as your parents.   Allergies happen in all age groups. You may have some idea of what caused your reaction. There are many allergens around us. It may be difficult to know what caused your reaction. If this is a first time event, it may never happen again. Allergies cannot be cured but can be controlled with medications.   SYMPTOMS   You may get some or all of the following problems from allergies.   Swelling and itching in and around the mouth.   Tearing, itchy eyes.   Nasal congestion and runny nose.   Sneezing and coughing.   An itchy red rash or hives.   Vomiting or diarrhea.   Difficulty breathing.  Seasonal allergies occur in all age groups. They are seasonal because they usually occur during the same season every year. They may be a reaction to molds, grass pollens, or tree pollens. Other causes of allergies are house dust mite allergens, pet dander and mold spores. These are just a common few of the thousands of allergens around us. All of the symptoms listed above happen when you come in contact with pollens and other allergens. Seasonal allergies are usually not life threatening. They are generally more of a nuisance that can often be handled using medications.   Hay fever is a combination of all or some of the above listed allergy problems. It  may often be treated with simple over-the-counter medications such as diphenhydramine. Take medication as directed. Check with your caregiver or package insert for child dosages.   TREATMENT AND HOME CARE INSTRUCTIONS   If hives or rash are present:   Take medications as directed.   You may use an over-the-counter antihistamine (diphenhydramine) for hives and itching as needed. Do not drive or drink alcohol until medications used to treat the reaction have worn off. Antihistamines tend to make people sleepy.   Apply cold cloths (compresses) to the skin or take baths in cool water. This will help itching. Avoid hot baths or showers. Heat will make a rash and itching worse.   If your allergies persist and become more severe, and over the counter medications are not effective, there are many new medications your caretaker can prescribe. Immunotherapy or desensitizing injections can be used if all else fails. Follow up with your caregiver if problems continue.  SEEK MEDICAL CARE IF:   Your allergies are becoming progressively more troublesome.   You suspect a food allergy. Symptoms generally happen within 30 minutes of eating a food.   Your symptoms have not gone away within 2 days or are getting worse.   You develop new symptoms.   You want to retest yourself or your child with a food or drink you think causes an allergic reaction. Never test yourself or your child of a suspected allergy without being under the watchful eye of your caregivers. A second   exposure to an allergen may be life-threatening.  SEEK IMMEDIATE MEDICAL CARE IF:   You develop difficulty breathing or wheezing, or have a tight feeling in your chest or throat.   You develop a swollen mouth, hives, swelling, or itching all over your body.  A severe reaction with any of the above problems should be considered life-threatening. If you suddenly develop difficulty breathing call for local emergency medical help. THIS IS AN EMERGENCY.   MAKE SURE YOU:    Understand these instructions.   Will watch your condition.   Will get help right away if you are not doing well or get worse.  Document Released: 12/23/2006 Document Revised: 02/14/2011 Document Reviewed: 12/23/2006   [employer] Patient Information 2012 [employer]

## 2011-05-27 NOTE — Progress Notes (Signed)
Observation review is complete. 

## 2011-05-27 NOTE — ED Notes (Signed)
PT CONTINUES EATING MEAL TRAY. SHE HAS BEEN GIVEN 6 ORANGE JUICES . SHE HAS BEEN GIVEN HER DISCHARGE INSTRUCTIONS. WAITING FOR HER TO FINISH HER MEAL SO SHE CAN DRESS AND GO HOME

## 2011-05-27 NOTE — ED Provider Notes (Signed)
Medical screening examination/treatment/procedure(s) were conducted as a shared visit with non-physician practitioner(s) and myself.  I personally evaluated the patient during the encounter  Pt observed in CDU she has no tongue involvement, no stridor, neck supple Continue on allergic rxn protocol  Joya Gaskins, MD 05/27/11 [phone]

## 2011-05-27 NOTE — ED Provider Notes (Signed)
Medical screening examination/treatment/procedure(s) were conducted as a shared visit with non-physician practitioner(s) and myself.  I personally evaluated the patient during the encounter.  History and physical consistent with idiopathic allergic reaction. Patient feeling much better at discharge. Decreased lip and tongue swelling. Good airway   Donnetta Hutching, MD 05/27/11 2350

## 2011-05-27 NOTE — ED Provider Notes (Signed)
Patient in CDU under allergic reaction protocol.  Patient's symptoms have improved. Minimal swelling of lower lip persists.  No swelling of tongue.  Lungs CTA bilaterally.   5:38 PM Patient has been seen by Dr. Adriana Simas.  Plan to d/c home with prescription for epi-pen.  Continue with po benadryl at home.  Jimmye Norman, NP 05/27/11 1740

## 2011-06-13 ENCOUNTER — Emergency Department (HOSPITAL_COMMUNITY)
Admission: EM | Admit: 2011-06-13 | Discharge: 2011-06-13 | Disposition: A | Discharge status: Home / Self Care | Payer: Medicare Other | Attending: Emergency Medicine | Admitting: Emergency Medicine

## 2011-06-13 ENCOUNTER — Emergency Department (HOSPITAL_COMMUNITY): Payer: Medicare Other

## 2011-06-13 ENCOUNTER — Encounter (HOSPITAL_COMMUNITY): Payer: Self-pay | Admitting: Emergency Medicine

## 2011-06-13 DIAGNOSIS — T783XXA Angioneurotic edema, initial encounter: Secondary | ICD-10-CM

## 2011-06-13 DIAGNOSIS — R062 Wheezing: Secondary | ICD-10-CM | POA: Insufficient documentation

## 2011-06-13 DIAGNOSIS — E119 Type 2 diabetes mellitus without complications: Secondary | ICD-10-CM | POA: Diagnosis not present

## 2011-06-13 DIAGNOSIS — I10 Essential (primary) hypertension: Secondary | ICD-10-CM | POA: Insufficient documentation

## 2011-06-13 DIAGNOSIS — R609 Edema, unspecified: Secondary | ICD-10-CM | POA: Diagnosis not present

## 2011-06-13 DIAGNOSIS — R220 Localized swelling, mass and lump, head: Secondary | ICD-10-CM | POA: Insufficient documentation

## 2011-06-13 DIAGNOSIS — Z79899 Other long term (current) drug therapy: Secondary | ICD-10-CM | POA: Diagnosis not present

## 2011-06-13 DIAGNOSIS — R0602 Shortness of breath: Secondary | ICD-10-CM | POA: Diagnosis not present

## 2011-06-13 DIAGNOSIS — R5383 Other fatigue: Secondary | ICD-10-CM | POA: Diagnosis not present

## 2011-06-13 DIAGNOSIS — R5381 Other malaise: Secondary | ICD-10-CM | POA: Diagnosis not present

## 2011-06-13 DIAGNOSIS — X58XXXA Exposure to other specified factors, initial encounter: Secondary | ICD-10-CM | POA: Insufficient documentation

## 2011-06-13 DIAGNOSIS — R221 Localized swelling, mass and lump, neck: Secondary | ICD-10-CM | POA: Insufficient documentation

## 2011-06-13 MED ORDER — DIPHENHYDRAMINE HCL 25 MG PO CAPS: 50.0000 mg | ORAL_CAPSULE | Freq: Four times a day (QID) | ORAL | Status: DC | PRN

## 2011-06-13 MED ORDER — SODIUM CHLORIDE 0.9 % IV SOLN
Freq: Once | INTRAVENOUS | Status: AC
  Administered 2011-06-13: 04:00:00 via INTRAVENOUS

## 2011-06-13 MED ORDER — DEXAMETHASONE SODIUM PHOSPHATE 10 MG/ML IJ SOLN
10.0000 mg | Freq: Once | INTRAMUSCULAR | Status: AC
  Administered 2011-06-13: 10 mg via INTRAVENOUS
  Filled 2011-06-13: qty 1

## 2011-06-13 MED ORDER — DIPHENHYDRAMINE HCL 50 MG/ML IJ SOLN
50.0000 mg | Freq: Once | INTRAMUSCULAR | Status: AC
  Administered 2011-06-13: 50 mg via INTRAVENOUS
  Filled 2011-06-13: qty 1

## 2011-06-13 MED ORDER — FAMOTIDINE IN NACL 20-0.9 MG/50ML-% IV SOLN
20.0000 mg | Freq: Once | INTRAVENOUS | Status: AC
  Administered 2011-06-13: 20 mg via INTRAVENOUS
  Filled 2011-06-13: qty 50

## 2011-06-13 MED ORDER — FENTANYL CITRATE 0.05 MG/ML IJ SOLN
50.0000 ug | Freq: Once | INTRAMUSCULAR | Status: AC
  Administered 2011-06-13: 50 ug via INTRAVENOUS
  Filled 2011-06-13: qty 2

## 2011-06-13 NOTE — ED Notes (Signed)
Dr. Read Drivers to bedside.

## 2011-06-13 NOTE — ED Notes (Signed)
Pt states she has angioedema  Pt states she does not know what causes this but has hx of same  Pt has swelling noted to her face and states she feels like her throat is swelling and feels like she is wheezing  Pt states she has been hospitalized twice recently for same

## 2011-06-13 NOTE — ED Provider Notes (Signed)
History     CSN: [id number]  Arrival date & time 06/13/11  0045   First MD Initiated Contact with Patient 06/13/11 [phone]      Chief Complaint  Patient presents with  . Shortness of Breath    (Consider location/radiation/quality/duration/timing/severity/associated sxs/prior treatment) HPI This is a 66 year old white female with a history of angioedema of undetermined etiology as well as an unspecified autoimmune dermatitis. She is here with swelling of the left side of her lips since yesterday afternoon. The onset was fairly acute. She initially had some sensation that her throat was swelling and that she was wheezing but those have resolved. The swelling in her lips have improved despite not having taken any medication for this. The symptoms are moderate at their worst and mild at the present time. She is also complaining of a "fiery" sensation and itching of the plaques of her chronic rash.  Past Medical History  Diagnosis Date  . Hypertension   . Diabetes mellitus   . Multiple allergies   . Angioedema     Past Surgical History  Procedure Date  . Tonsillectomy 1958  . Orthopedic surgery     Family History  Problem Relation Age of Onset  . Hypertension Brother   . Hyperlipidemia Brother   . Asthma Brother   . Throat cancer Brother   . Diabetes type II Mother   . Hypertension Mother   . Hyperlipidemia Mother     History  Substance Use Topics  . Smoking status: Never Smoker   . Smokeless tobacco: Not on file  . Alcohol Use: No    OB History    Grav Para Term Preterm Abortions TAB SAB Ect Mult Living                  Review of Systems  All other systems reviewed and are negative.    Allergies  Codeine; Penicillins; and Sulfa antibiotics  Home Medications   Current Outpatient Rx  Name Route Sig Dispense Refill  . ACETAMINOPHEN 500 MG PO TABS Oral Take 500 mg by mouth every 6 (six) hours as needed. Pain     . BETAMETHASONE DIPROPIONATE AUG 0.05 % EX GEL  Topical Apply 1 application topically 2 (two) times daily.      Marland Kitchen DIPHENHYDRAMINE HCL 25 MG PO TABS Oral Take 25 mg by mouth every 6 (six) hours as needed. Itching for arm rash    . EPINEPHRINE 0.3 MG/0.3ML IJ DEVI Intramuscular Inject 0.3 mLs (0.3 mg total) into the muscle as needed. 2 Device 0  . GLIPIZIDE 10 MG PO TABS Oral Take 10 mg by mouth every morning.      Marland Kitchen HYDROXYZINE HCL 50 MG PO TABS Oral Take 50 mg by mouth every 6 (six) hours as needed. For itching     . IBUPROFEN 200 MG PO TABS Oral Take 200 mg by mouth every 6 (six) hours as needed. Pain     . METFORMIN HCL 500 MG PO TABS Oral Take 500 mg by mouth 2 (two) times daily with a meal.      . METOPROLOL TARTRATE 25 MG PO TABS Oral Take 25 mg by mouth 2 (two) times daily.      Marland Kitchen ONDANSETRON HCL 4 MG PO TABS Oral Take 4 mg by mouth every 6 (six) hours as needed. For nausea       BP 170/94  Pulse 101  Temp(Src) 97.5 F (36.4 C) (Oral)  Resp 20  SpO2 93%  Physical Exam  General: Well-developed, well-nourished female in no acute distress; appearance consistent with age of record HENT: normocephalic, atraumatic; no pharyngeal edema; no edema of tongue; mild angioedema of left side of lips Eyes: pupils equal round and reactive to light; extraocular muscles intact Neck: supple; no stridor; no dysphonia Heart: regular rate and rhythm Lungs: clear to auscultation bilaterally Abdomen: soft; nondistended; bowel sounds present Extremities: No deformity; full range of motion; trace edema of lower legs Neurologic: Awake, alert and oriented; motor function intact in all extremities and symmetric; no facial droop Skin: Patchy hypopigmentation; scattered inflammatory patches with overlying excoriations     ED Course  Procedures (including critical care time)     MDM  6:01 AM Sleeping comfortably after IV meds. Angioedema nearly resolved.          Hanley Seamen, MD 06/13/11 [phone]

## 2011-06-13 NOTE — Discharge Instructions (Signed)
Angioedema Angioedema (AE) is a sudden swelling of the eyelids, lips, lobes of ears, external genitalia, skin, and other parts of the body. AE can happen by itself. It usually begins during the night and is found on awakening. It can happen with hives and other allergic reactions. Attacks can be mild and annoying, or life-threatening if the air passages swell. AE generally occurs in a short time period (over minutes to hours) and gets better in 24 to 48 hours. It usually does not cause any serious problems.  There are 2 different kinds of AE:   Allergic AE.   Nonallergic AE.   There may be an overreaction or direct stimulation of cells that are a part of the immune system (mast cells).   There may be problems with the release of chemicals made by the body that cause swelling and inflammation (kinins). AE due to kinins can be inherited from parents (hereditary), or it can develop on its own (acquired). Acquired AE either shows up before, or along with, certain diseases or is due to the body's immune system attacking parts of the body's own cells (autoimmune).  CAUSES  Allergic  AE due to allergic reactions are caused by something that causes the body to react (trigger). Common triggers include:   Foods.   Medicines.   Latex.   Direct contact with certain fruits, vegetables, or animal saliva.   Insect stings.  Nonallergic  Mast cell stimulation may be caused by:   Medicines.   Dyes used in X-rays.   The body's own immune system reactions to parts of the body (autoimmune disease).   Possibly, some virus infections.   AE due to problems with kinins can be hereditary or acquired. Attacks are triggered by:   Mild injury.   Dental work or any surgery.   Stress.   Sudden changes in temperature.   Exercise.   Medicines.   AE due to problems with kinins can also be due to certain medicines, especially blood pressure medicines like angiotensin-converting enzyme (ACE)  inhibitors. African Americans are at nearly 5 times greater risk of developing AE than Caucasians from ACE inhibitors.  SYMPTOMS  Allergic symptoms:  Non-itchy swelling of the skin. Often the swelling is on the face and lips, but any area of the skin can swell. Sometimes, the swelling can be painful. If hives are present, there is intense itching.   Breathing problems if the air passages swell.  Nonallergic symptoms:  If internal organs are involved, there may be:   Nausea.   Abdominal pain.   Vomiting.   Difficulty swallowing.   Difficulty passing urine.   Breathing problems if the air passages swell.  Depending on the cause of AE, episodes may:  Only happen once (if triggers are removed or avoided).   Come back in unpredictable patterns.   Repeat for several years and then gradually fade away.  DIAGNOSIS  AE is diagnosed by:   Asking questions to find out how fast the symptoms began.   Taking a family history.   Physical exam.   Diagnostic tests. Tests could include:   Allergy skin tests to see if the problem is allergic.   Blood tests to diagnose hereditary and some acquired types of AE.   Other tests to see if there is a hidden disease leading to the AE.  TREATMENT  Treatment depends on the type and cause (if any) of the AE. Allergic  Allergic types of AE are treated with:   Immediate removal of   the trigger or medicine (if any).   Epinephrine injection.   Steroids.   Antihistamines.   Hospitalization for severe attacks.  Nonallergic  Mast cell stimulation types of AE are treated with:   Immediate removal of the trigger or medicine (if any).   Epinephrine injection.   Steroids.   Antihistamines.   Hospitalization for severe attacks.   Hereditary AE is treated with:   Medicines to prevent and treat attacks. There is little response to antihistamines, epinephrine, or steroids.   Preventive medicines before dental work or surgery.    Removing or avoiding medicines that trigger attacks.   Hospitalization for severe attacks.   Acquired AE is treated with:   Treating underlying disease (if any).   Medicines to prevent and treat attacks.  HOME CARE INSTRUCTIONS   Always carry your emergency allergy treatment medicines with you.   Wear a medical bracelet.   Avoid known triggers.  SEEK MEDICAL CARE IF:   You get repeat attacks.   Your attacks are more frequent or more severe despite preventive measures.   You have hereditary AE and are considering having children. It is important to discuss the risks of passing this on to your children.  SEEK IMMEDIATE MEDICAL CARE IF:   You have difficulty breathing.   You have difficulty swallowing.   You experience fainting.  This condition should be treated immediately. It can be life-threatening if it involves throat swelling. Document Released: 05/06/2001 Document Revised: 02/14/2011 Document Reviewed: 02/25/2008 [facility] Patient Information 2012 [place], [place].Angioedema Angioedema (AE) is a sudden swelling of the eyelids, lips, lobes of ears, external genitalia, skin, and other parts of the body. AE can happen by itself. It usually begins during the night and is found on awakening. It can happen with hives and other allergic reactions. Attacks can be mild and annoying, or life-threatening if the air passages swell. AE generally occurs in a short time period (over minutes to hours) and gets better in 24 to 48 hours. It usually does not cause any serious problems.  There are 2 different kinds of AE:   Allergic AE.   Nonallergic AE.   There may be an overreaction or direct stimulation of cells that are a part of the immune system (mast cells).   There may be problems with the release of chemicals made by the body that cause swelling and inflammation (kinins). AE due to kinins can be inherited from parents (hereditary), or it can develop on its own (acquired).  Acquired AE either shows up before, or along with, certain diseases or is due to the body's immune system attacking parts of the body's own cells (autoimmune).  CAUSES  Allergic  AE due to allergic reactions are caused by something that causes the body to react (trigger). Common triggers include:   Foods.   Medicines.   Latex.   Direct contact with certain fruits, vegetables, or animal saliva.   Insect stings.  Nonallergic  Mast cell stimulation may be caused by:   Medicines.   Dyes used in X-rays.   The body's own immune system reactions to parts of the body (autoimmune disease).   Possibly, some virus infections.   AE due to problems with kinins can be hereditary or acquired. Attacks are triggered by:   Mild injury.   Dental work or any surgery.   Stress.   Sudden changes in temperature.   Exercise.   Medicines.   AE due to problems with kinins can also be due to certain medicines,  especially blood pressure medicines like angiotensin-converting enzyme (ACE) inhibitors. African Americans are at nearly 5 times greater risk of developing AE than Caucasians from ACE inhibitors.  SYMPTOMS  Allergic symptoms:  Non-itchy swelling of the skin. Often the swelling is on the face and lips, but any area of the skin can swell. Sometimes, the swelling can be painful. If hives are present, there is intense itching.   Breathing problems if the air passages swell.  Nonallergic symptoms:  If internal organs are involved, there may be:   Nausea.   Abdominal pain.   Vomiting.   Difficulty swallowing.   Difficulty passing urine.   Breathing problems if the air passages swell.  Depending on the cause of AE, episodes may:  Only happen once (if triggers are removed or avoided).   Come back in unpredictable patterns.   Repeat for several years and then gradually fade away.  DIAGNOSIS  AE is diagnosed by:   Asking questions to find out how fast the symptoms began.    Taking a family history.   Physical exam.   Diagnostic tests. Tests could include:   Allergy skin tests to see if the problem is allergic.   Blood tests to diagnose hereditary and some acquired types of AE.   Other tests to see if there is a hidden disease leading to the AE.  TREATMENT  Treatment depends on the type and cause (if any) of the AE. Allergic  Allergic types of AE are treated with:   Immediate removal of the trigger or medicine (if any).   Epinephrine injection.   Steroids.   Antihistamines.   Hospitalization for severe attacks.  Nonallergic  Mast cell stimulation types of AE are treated with:   Immediate removal of the trigger or medicine (if any).   Epinephrine injection.   Steroids.   Antihistamines.   Hospitalization for severe attacks.   Hereditary AE is treated with:   Medicines to prevent and treat attacks. There is little response to antihistamines, epinephrine, or steroids.   Preventive medicines before dental work or surgery.   Removing or avoiding medicines that trigger attacks.   Hospitalization for severe attacks.   Acquired AE is treated with:   Treating underlying disease (if any).   Medicines to prevent and treat attacks.  HOME CARE INSTRUCTIONS   Always carry your emergency allergy treatment medicines with you.   Wear a medical bracelet.   Avoid known triggers.  SEEK MEDICAL CARE IF:   You get repeat attacks.   Your attacks are more frequent or more severe despite preventive measures.   You have hereditary AE and are considering having children. It is important to discuss the risks of passing this on to your children.  SEEK IMMEDIATE MEDICAL CARE IF:   You have difficulty breathing.   You have difficulty swallowing.   You experience fainting.  This condition should be treated immediately. It can be life-threatening if it involves throat swelling. Document Released: 05/06/2001 Document Revised: 02/14/2011  Document Reviewed: 02/25/2008 [facility] Patient Information 2012 [place], [place].

## 2011-06-13 NOTE — ED Notes (Signed)
Patient c/o angioedema and swelling to lips/face since approx. 1600 yesterday accompanied with a constant headache. Patient also reports progressive BLE swelling since Tuesday. Patient has lesions to BLE, BUE, and flank since approx. August of 2011. Reports seeing a dermatologist with no specific diagnosis determined.

## 2011-06-23 ENCOUNTER — Emergency Department (HOSPITAL_COMMUNITY): Payer: Medicare Other

## 2011-06-23 ENCOUNTER — Observation Stay (HOSPITAL_COMMUNITY)
Admission: EM | Admit: 2011-06-23 | Discharge: 2011-06-24 | Disposition: A | Discharge status: Home / Self Care | Payer: Medicare Other | Attending: Internal Medicine | Admitting: Internal Medicine

## 2011-06-23 ENCOUNTER — Encounter (HOSPITAL_COMMUNITY): Payer: Self-pay | Admitting: Emergency Medicine

## 2011-06-23 DIAGNOSIS — T783XXA Angioneurotic edema, initial encounter: Principal | ICD-10-CM | POA: Insufficient documentation

## 2011-06-23 DIAGNOSIS — L80 Vitiligo: Secondary | ICD-10-CM | POA: Diagnosis present

## 2011-06-23 DIAGNOSIS — R0602 Shortness of breath: Secondary | ICD-10-CM | POA: Insufficient documentation

## 2011-06-23 DIAGNOSIS — I10 Essential (primary) hypertension: Secondary | ICD-10-CM | POA: Insufficient documentation

## 2011-06-23 DIAGNOSIS — Z79899 Other long term (current) drug therapy: Secondary | ICD-10-CM | POA: Diagnosis not present

## 2011-06-23 DIAGNOSIS — L309 Dermatitis, unspecified: Secondary | ICD-10-CM

## 2011-06-23 DIAGNOSIS — E118 Type 2 diabetes mellitus with unspecified complications: Secondary | ICD-10-CM | POA: Diagnosis not present

## 2011-06-23 DIAGNOSIS — IMO0002 Reserved for concepts with insufficient information to code with codable children: Secondary | ICD-10-CM | POA: Diagnosis not present

## 2011-06-23 DIAGNOSIS — E119 Type 2 diabetes mellitus without complications: Secondary | ICD-10-CM

## 2011-06-23 DIAGNOSIS — X58XXXA Exposure to other specified factors, initial encounter: Secondary | ICD-10-CM | POA: Insufficient documentation

## 2011-06-23 DIAGNOSIS — L2089 Other atopic dermatitis: Secondary | ICD-10-CM | POA: Diagnosis not present

## 2011-06-23 DIAGNOSIS — R739 Hyperglycemia, unspecified: Secondary | ICD-10-CM | POA: Diagnosis present

## 2011-06-23 DIAGNOSIS — L259 Unspecified contact dermatitis, unspecified cause: Secondary | ICD-10-CM | POA: Diagnosis not present

## 2011-06-23 LAB — POCT I-STAT, CHEM 8
Creatinine, Ser: 0.7 mg/dL (ref 0.50–1.10)
Hemoglobin: 14.3 g/dL (ref 12.0–15.0)
Sodium: 139 mEq/L (ref 135–145)
TCO2: 20 mmol/L (ref 0–100)

## 2011-06-23 LAB — URINALYSIS, ROUTINE W REFLEX MICROSCOPIC
Bilirubin Urine: NEGATIVE
Nitrite: NEGATIVE
Specific Gravity, Urine: 1.023 (ref 1.005–1.030)
Urobilinogen, UA: 0.2 mg/dL (ref 0.0–1.0)

## 2011-06-23 LAB — CBC
MCHC: 34.6 g/dL (ref 30.0–36.0)
Platelets: 246 10*3/uL (ref 150–400)
RDW: 13 % (ref 11.5–15.5)

## 2011-06-23 LAB — DIFFERENTIAL
Basophils Absolute: 0 10*3/uL (ref 0.0–0.1)
Basophils Relative: 0 % (ref 0–1)
Neutro Abs: 6.7 10*3/uL (ref 1.7–7.7)
Neutrophils Relative %: 88 % — ABNORMAL HIGH (ref 43–77)

## 2011-06-23 LAB — GLUCOSE, CAPILLARY

## 2011-06-23 LAB — URINE MICROSCOPIC-ADD ON: Urine-Other: NONE SEEN

## 2011-06-23 MED ORDER — CARVEDILOL 25 MG PO TABS
25.0000 mg | ORAL_TABLET | Freq: Two times a day (BID) | ORAL | Status: DC
  Administered 2011-06-24: 25 mg via ORAL
  Filled 2011-06-23 (×2): qty 1

## 2011-06-23 MED ORDER — SODIUM CHLORIDE 0.9 % IV SOLN: INTRAVENOUS | Status: DC

## 2011-06-23 MED ORDER — INSULIN ASPART 100 UNIT/ML ~~LOC~~ SOLN
0.0000 [IU] | Freq: Three times a day (TID) | SUBCUTANEOUS | Status: DC
  Administered 2011-06-24: 5 [IU] via SUBCUTANEOUS
  Administered 2011-06-24: 8 [IU] via SUBCUTANEOUS

## 2011-06-23 MED ORDER — DIPHENHYDRAMINE HCL 25 MG PO TABS
25.0000 mg | ORAL_TABLET | Freq: Four times a day (QID) | ORAL | Status: DC | PRN
  Filled 2011-06-23: qty 1

## 2011-06-23 MED ORDER — CLOTRIMAZOLE 1 % EX CREA
TOPICAL_CREAM | Freq: Two times a day (BID) | CUTANEOUS | Status: DC
  Filled 2011-06-23: qty 15

## 2011-06-23 MED ORDER — SODIUM CHLORIDE 0.9 % IJ SOLN: 3.0000 mL | Freq: Two times a day (BID) | INTRAMUSCULAR | Status: DC

## 2011-06-23 MED ORDER — ACETAMINOPHEN 325 MG PO TABS
650.0000 mg | ORAL_TABLET | Freq: Four times a day (QID) | ORAL | Status: DC | PRN
  Filled 2011-06-23: qty 2

## 2011-06-23 MED ORDER — FAMOTIDINE IN NACL 20-0.9 MG/50ML-% IV SOLN
20.0000 mg | Freq: Once | INTRAVENOUS | Status: AC
  Administered 2011-06-23: 20 mg via INTRAVENOUS
  Filled 2011-06-23: qty 50

## 2011-06-23 MED ORDER — HYDROXYZINE HCL 50 MG PO TABS
50.0000 mg | ORAL_TABLET | Freq: Four times a day (QID) | ORAL | Status: DC | PRN
  Filled 2011-06-23: qty 1

## 2011-06-23 MED ORDER — DIPHENHYDRAMINE HCL 25 MG PO CAPS
50.0000 mg | ORAL_CAPSULE | Freq: Once | ORAL | Status: AC
  Administered 2011-06-23: 50 mg via ORAL
  Filled 2011-06-23: qty 2

## 2011-06-23 MED ORDER — INSULIN ASPART 100 UNIT/ML ~~LOC~~ SOLN
0.0000 [IU] | Freq: Every day | SUBCUTANEOUS | Status: DC
  Administered 2011-06-23: 4 [IU] via SUBCUTANEOUS

## 2011-06-23 MED ORDER — INSULIN GLARGINE 100 UNIT/ML ~~LOC~~ SOLN
5.0000 [IU] | Freq: Every day | SUBCUTANEOUS | Status: DC
  Administered 2011-06-23: 5 [IU] via SUBCUTANEOUS

## 2011-06-23 MED ORDER — DIPHENHYDRAMINE HCL 50 MG/ML IJ SOLN
50.0000 mg | Freq: Once | INTRAMUSCULAR | Status: AC
  Administered 2011-06-23: 50 mg via INTRAVENOUS
  Filled 2011-06-23: qty 1

## 2011-06-23 MED ORDER — SODIUM CHLORIDE 0.9 % IV SOLN
INTRAVENOUS | Status: DC
  Administered 2011-06-24: 06:00:00 via INTRAVENOUS

## 2011-06-23 MED ORDER — ONDANSETRON HCL 4 MG/2ML IJ SOLN: 4.0000 mg | Freq: Four times a day (QID) | INTRAMUSCULAR | Status: DC | PRN

## 2011-06-23 MED ORDER — ONDANSETRON HCL 4 MG PO TABS: 4.0000 mg | ORAL_TABLET | Freq: Four times a day (QID) | ORAL | Status: DC | PRN

## 2011-06-23 MED ORDER — METHYLPREDNISOLONE SODIUM SUCC 125 MG IJ SOLR
125.0000 mg | Freq: Once | INTRAMUSCULAR | Status: AC
  Administered 2011-06-23: 125 mg via INTRAVENOUS
  Filled 2011-06-23: qty 2

## 2011-06-23 MED ORDER — SODIUM CHLORIDE 0.9 % IV BOLUS (SEPSIS)
1000.0000 mL | Freq: Once | INTRAVENOUS | Status: AC
  Administered 2011-06-23: 1000 mL via INTRAVENOUS

## 2011-06-23 MED ORDER — ACETAMINOPHEN 650 MG RE SUPP: 650.0000 mg | Freq: Four times a day (QID) | RECTAL | Status: DC | PRN

## 2011-06-23 MED ORDER — BETAMETHASONE DIPROPIONATE AUG 0.05 % EX GEL: 1.0000 "application " | Freq: Two times a day (BID) | CUTANEOUS | Status: DC

## 2011-06-23 MED ORDER — SODIUM CHLORIDE 0.9 % IV SOLN
INTRAVENOUS | Status: AC
  Administered 2011-06-23: 20:00:00 via INTRAVENOUS

## 2011-06-23 MED ORDER — BETAMETHASONE VALERATE 0.1 % EX LOTN
TOPICAL_LOTION | Freq: Two times a day (BID) | CUTANEOUS | Status: DC
  Administered 2011-06-23 – 2011-06-24 (×2): via TOPICAL
  Filled 2011-06-23: qty 0.06

## 2011-06-23 NOTE — ED Notes (Signed)
Admitting md at bedside. Pt aware of plan of care.

## 2011-06-23 NOTE — H&P (Signed)
PCP:   Pcp Not In System   Chief Complaint:  Lips swelling (hx of angioedema)  HPI: 66 y/o female with PMH of DM, HTN, vitiligo, dermatitis and hx of angioedema (apparently autoimmune); came to ED due to lips swollen. Patient denies any fever, chills, any identified allergen or any other acute complaints. She also report that she has stop all her meds for the last 3-4 days (no explanatory reason given). In the ED she was treated with steroids and benadryl; swelling improved significantly. Since patient do not have any PCP for further follow up and the fact she was also found with tachycardia and hyperglycemia, [facility] was contacted to admit patient for observation and safety.  Allergies:   Allergies  Allergen Reactions  . Codeine Nausea And Vomiting  . Penicillins Hives and Swelling  . Sulfa Antibiotics Nausea And Vomiting      Past Medical History  Diagnosis Date  . Hypertension   . Diabetes mellitus   . Multiple allergies   . Angioedema     Past Surgical History  Procedure Date  . Tonsillectomy 1958  . Orthopedic surgery     Prior to Admission medications   Medication Sig Start Date End Date Taking? Authorizing Provider  betamethasone, augmented, (DIPROLENE) 0.05 % gel Apply 1 application topically 2 (two) times daily.     Yes Historical Provider, MD  diphenhydrAMINE (BENADRYL) 25 MG tablet Take 25 mg by mouth every 6 (six) hours as needed. Itching for arm rash   Yes Historical Provider, MD  EPINEPHrine (EPIPEN) 0.3 mg/0.3 mL DEVI Inject 0.3 mLs (0.3 mg total) into the muscle as needed. 05/27/11  Yes Jimmye Norman, NP  glipiZIDE (GLUCOTROL) 10 MG tablet Take 10 mg by mouth every morning.     Yes Historical Provider, MD  hydrOXYzine (ATARAX/VISTARIL) 50 MG tablet Take 50 mg by mouth every 6 (six) hours as needed. For itching    Yes Historical Provider, MD  ibuprofen (ADVIL,MOTRIN) 200 MG tablet Take 200 mg by mouth every 6 (six) hours as needed. Pain    Yes Historical Provider,  MD  metFORMIN (GLUCOPHAGE) 500 MG tablet Take 500 mg by mouth 2 (two) times daily with a meal.     Yes Historical Provider, MD  metoprolol tartrate (LOPRESSOR) 25 MG tablet Take 25 mg by mouth 2 (two) times daily.     Yes Historical Provider, MD  ondansetron (ZOFRAN) 4 MG tablet Take 4 mg by mouth every 6 (six) hours as needed. For nausea    Yes Historical Provider, MD    Social History:  reports that she has never smoked. She does not have any smokeless tobacco history on file. She reports that she does not drink alcohol or use illicit drugs.  Family History  Problem Relation Age of Onset  . Hypertension Brother   . Hyperlipidemia Brother   . Asthma Brother   . Throat cancer Brother   . Diabetes type II Mother   . Hypertension Mother   . Hyperlipidemia Mother     Review of Systems:  Negative except as mentioned to HPI.   Physical Exam: Blood pressure 154/88, pulse 108, temperature 97.7 F (36.5 C), temperature source Oral, resp. rate 18, SpO2 99.00%. GEN: Well-developed, well-nourished female in no distress  HEENT: Atraumatic, normocephalic. Patient has no tongue swelling on exam. Oropharynx is clear with no posterior swelling. Patient with mild residual right upper lip swelling.  EYES: PERRLA, no scleral icterus.  NECK: Trachea midline, no bruits; no thyromegaly CV: regular  rate and rhythm. No murmurs, rubs, or gallops  PULM: No respiratory distress. No crackles, wheezes, or rales.  GI: soft, non-tender. No guarding, rebound, or tenderness. + bowel sounds  Neuro: cranial nerves 2-12 intact, no abnormalities of strength or sensation, A and O x 3  MSK: Patient moves all 4 extremities symmetrically, no deformity, edema, or injury noted Skin:multiple erythematous lesions on her skin especially legs and arm (psoriatic in appearance); also with vitiligo.   Labs on Admission:  Results for orders placed during the hospital encounter of 06/23/11 (from the past 48 hour(s))    URINALYSIS, ROUTINE W REFLEX MICROSCOPIC     Status: Abnormal   Collection Time   06/23/11  3:01 PM      Component Value Range Comment   Color, Urine YELLOW  YELLOW     APPearance CLEAR  CLEAR     Specific Gravity, Urine 1.023  1.005 - 1.030     pH 6.0  5.0 - 8.0     Glucose, UA >1000 (*) NEGATIVE (mg/dL)    Hgb urine dipstick NEGATIVE  NEGATIVE     Bilirubin Urine NEGATIVE  NEGATIVE     Ketones, ur 15 (*) NEGATIVE (mg/dL)    Protein, ur NEGATIVE  NEGATIVE (mg/dL)    Urobilinogen, UA 0.2  0.0 - 1.0 (mg/dL)    Nitrite NEGATIVE  NEGATIVE     Leukocytes, UA NEGATIVE  NEGATIVE    URINE MICROSCOPIC-ADD ON     Status: Normal   Collection Time   06/23/11  3:01 PM      Component Value Range Comment   Urine-Other        Value: NO FORMED ELEMENTS SEEN ON URINE MICROSCOPIC EXAMINATION  CBC     Status: Normal   Collection Time   06/23/11  3:10 PM      Component Value Range Comment   WBC 7.6  4.0 - 10.5 (K/uL)    RBC 4.67  3.87 - 5.11 (MIL/uL)    Hemoglobin 14.0  12.0 - 15.0 (g/dL)    HCT 96.0  45.4 - 09.8 (%)    MCV 86.7  78.0 - 100.0 (fL)    MCH 30.0  26.0 - 34.0 (pg)    MCHC 34.6  30.0 - 36.0 (g/dL)    RDW 11.9  14.7 - 82.9 (%)    Platelets 246  150 - 400 (K/uL)   DIFFERENTIAL     Status: Abnormal   Collection Time   06/23/11  3:10 PM      Component Value Range Comment   Neutrophils Relative 88 (*) 43 - 77 (%)    Neutro Abs 6.7  1.7 - 7.7 (K/uL)    Lymphocytes Relative 11 (*) 12 - 46 (%)    Lymphs Abs 0.8  0.7 - 4.0 (K/uL)    Monocytes Relative 0 (*) 3 - 12 (%)    Monocytes Absolute 0.0 (*) 0.1 - 1.0 (K/uL)    Eosinophils Relative 0  0 - 5 (%)    Eosinophils Absolute 0.0  0.0 - 0.7 (K/uL)    Basophils Relative 0  0 - 1 (%)    Basophils Absolute 0.0  0.0 - 0.1 (K/uL)   POCT I-STAT, CHEM 8     Status: Abnormal   Collection Time   06/23/11  3:54 PM      Component Value Range Comment   Sodium 139  135 - 145 (mEq/L)    Potassium 3.9  3.5 - 5.1 (mEq/L)    Chloride 108  96 - 112  (mEq/L)    BUN 14  6 - 23 (mg/dL)    Creatinine, Ser 1.61  0.50 - 1.10 (mg/dL)    Glucose, Bld 096 (*) 70 - 99 (mg/dL)    Calcium, Ion 0.45  1.12 - 1.32 (mmol/L)    TCO2 20  0 - 100 (mmol/L)    Hemoglobin 14.3  12.0 - 15.0 (g/dL)    HCT 40.9  81.1 - 91.4 (%)   GLUCOSE, CAPILLARY     Status: Abnormal   Collection Time   06/23/11  4:37 PM      Component Value Range Comment   Glucose-Capillary 316 (*) 70 - 99 (mg/dL)    Comment 1 Notify RN      Comment 2 Documented in Chart       Radiological Exams on Admission: Dg Chest 2 View  06/23/2011  *RADIOLOGY REPORT*  Clinical Data: Shortness of breath  CHEST - 2 VIEW  Comparison: 06/13/2011  Findings: The cardiomediastinal silhouette is unremarkable. Mild elevation of the right hemidiaphragm is again noted. There is no evidence of focal airspace disease, pulmonary edema, suspicious pulmonary nodule/mass, pleural effusion, or pneumothorax. No acute bony abnormalities are identified.  IMPRESSION: No evidence of acute cardiopulmonary disease.  Original Report Authenticated By: Rosendo Gros, M.D.    Assessment/Plan 1-Angioedema of lips: partially resolved after steroids and benadryl. Will check C1 and C4; patient not taking any medications that could precipitate this and not really work up in the past due to lack of primary care services. Will admit for observation to ensure no angioedema rebound.  2-DIABETES MELLITUS II, UNCOMPLICATED: with hyperglycemia; 2/2 medication non-compliance and also use of steroids for treatment of angioedema. Will check A1C, SSI, lantus 5 units and CBG's QAC/QHS.  3-HYPERTENSION, BENIGN SYSTEMIC: will use coreg bid.  4-Dermatitis: continue topic diprolene and also atarax to control itching and superinfection.  5-DVT: SCD's   Time Spent on Admission: 40 minutes  Ireta Pullman [facility] [phone]  06/23/2011, 6:18 PM

## 2011-06-23 NOTE — ED Provider Notes (Signed)
History     CSN: [id number]  Arrival date & time 06/23/11  1478   First MD Initiated Contact with Patient 06/23/11 0710      Chief Complaint  Patient presents with  . Angioedema    (Consider location/radiation/quality/duration/timing/severity/associated sxs/prior treatment) HPI Patient is a 66 year old female with history of angioedema who presents today complaining of right upper lip swelling that began around 1:30 AM. She has been intubated for angioedema within the past 2 months. Patient had no known exposures that precipitated this at the time. She reports that someone has told her she has autoimmune disease that she has no primary care provider and has not followed up with anyone on this. She also has multiple scabbed over skin lesions that sound like they were previously vesicles. Patient has seen 2 dermatologists for this and reports that they gave her a lotion which did not work. Patient does have history of shingles and lesions are somewhat consistent with a healing case of these. Patient denies any tongue swelling or throat swelling at this time. She's had no wheezing or difficulty breathing. She denies any abdominal pain. Patient is hemodynamically stable on presentation. There are no other associated or modifying factors.  Past Medical History  Diagnosis Date  . Hypertension   . Diabetes mellitus   . Multiple allergies   . Angioedema     Past Surgical History  Procedure Date  . Tonsillectomy 1958  . Orthopedic surgery     Family History  Problem Relation Age of Onset  . Hypertension Brother   . Hyperlipidemia Brother   . Asthma Brother   . Throat cancer Brother   . Diabetes type II Mother   . Hypertension Mother   . Hyperlipidemia Mother     History  Substance Use Topics  . Smoking status: Never Smoker   . Smokeless tobacco: Not on file  . Alcohol Use: No    OB History    Grav Para Term Preterm Abortions TAB SAB Ect Mult Living                   Review of Systems  Constitutional: Negative.   HENT: Positive for facial swelling.   Eyes: Negative.   Respiratory: Negative.   Cardiovascular: Negative.   Gastrointestinal: Negative.   Genitourinary: Negative.   Musculoskeletal: Negative.   Skin: Positive for rash.  Neurological: Negative.   Hematological: Negative.   Psychiatric/Behavioral: Negative.   All other systems reviewed and are negative.    Allergies  Codeine; Penicillins; and Sulfa antibiotics  Home Medications   Current Outpatient Rx  Name Route Sig Dispense Refill  . BETAMETHASONE DIPROPIONATE AUG 0.05 % EX GEL Topical Apply 1 application topically 2 (two) times daily.      Marland Kitchen DIPHENHYDRAMINE HCL 25 MG PO TABS Oral Take 25 mg by mouth every 6 (six) hours as needed. Itching for arm rash    . EPINEPHRINE 0.3 MG/0.3ML IJ DEVI Intramuscular Inject 0.3 mLs (0.3 mg total) into the muscle as needed. 2 Device 0  . GLIPIZIDE 10 MG PO TABS Oral Take 10 mg by mouth every morning.      Marland Kitchen HYDROXYZINE HCL 50 MG PO TABS Oral Take 50 mg by mouth every 6 (six) hours as needed. For itching     . IBUPROFEN 200 MG PO TABS Oral Take 200 mg by mouth every 6 (six) hours as needed. Pain     . METFORMIN HCL 500 MG PO TABS Oral Take 500 mg by mouth  2 (two) times daily with a meal.      . METOPROLOL TARTRATE 25 MG PO TABS Oral Take 25 mg by mouth 2 (two) times daily.      Marland Kitchen ONDANSETRON HCL 4 MG PO TABS Oral Take 4 mg by mouth every 6 (six) hours as needed. For nausea       BP 153/81  Pulse 99  Temp(Src) 97.8 F (36.6 C) (Oral)  Resp 16  SpO2 96%  Physical Exam  Nursing note and vitals reviewed. GEN: Well-developed, well-nourished female in no distress HEENT: Atraumatic, normocephalic. Patient has no tongue swelling on exam. Oropharynx is clear with no posterior swelling. Patient does have right upper lip swelling.  EYES: PERRLA BL, no scleral icterus. NECK: Trachea midline, no meningismus CV: regular rate and rhythm. No  murmurs, rubs, or gallops PULM: No respiratory distress.  No crackles, wheezes, or rales. GI: soft, non-tender. No guarding, rebound, or tenderness. + bowel sounds  GU: deferred Neuro: cranial nerves 2-12 intact, no abnormalities of strength or sensation, A and O x 3 MSK: Patient moves all 4 extremities symmetrically, no deformity, edema, or injury noted Skin: Patient does have patches over the bilateral lower extremities which appear to be healing scabs over prior vesicles. Some of these are also noted on the upper arms. They do appear to be somewhat consistent with rash of shingles. They do not encompass the entire dermatomal distribution but are confined to a single dermatome. Psych: no abnormality of mood   ED Course  Procedures (including critical care time)   Date: 06/23/2011  Rate: 105  Rhythm: sinus tachycardia  QRS Axis: normal  Intervals: normal  ST/T Wave abnormalities: nonspecific T wave changes  Conduction Disutrbances:none  Narrative Interpretation:   Old EKG Reviewed: unchanged   Labs Reviewed  DIFFERENTIAL - Abnormal; Notable for the following:    Neutrophils Relative 88 (*)    Lymphocytes Relative 11 (*)    Monocytes Relative 0 (*)    Monocytes Absolute 0.0 (*)    All other components within normal limits  URINALYSIS, ROUTINE W REFLEX MICROSCOPIC - Abnormal; Notable for the following:    Glucose, UA >1000 (*)    Ketones, ur 15 (*)    All other components within normal limits  POCT I-STAT, CHEM 8 - Abnormal; Notable for the following:    Glucose, Bld 391 (*)    All other components within normal limits  GLUCOSE, CAPILLARY - Abnormal; Notable for the following:    Glucose-Capillary 316 (*)    All other components within normal limits  CBC  URINE MICROSCOPIC-ADD ON   Dg Chest 2 View  06/23/2011  *RADIOLOGY REPORT*  Clinical Data: Shortness of breath  CHEST - 2 VIEW  Comparison: 06/13/2011  Findings: The cardiomediastinal silhouette is unremarkable. Mild  elevation of the right hemidiaphragm is again noted. There is no evidence of focal airspace disease, pulmonary edema, suspicious pulmonary nodule/mass, pleural effusion, or pneumothorax. No acute bony abnormalities are identified.  IMPRESSION: No evidence of acute cardiopulmonary disease.  Original Report Authenticated By: Rosendo Gros, M.D.     1. Angioedema   2. Hyperglycemia       MDM  Patient was evaluated by myself. She had right upper lip swelling. She was given Solu-Medrol, Benadryl, and Pepcid. Patient remained stable. She was placed on monitor and had peripheral IV. While in the ED the patient had improvement in her lip swelling. This did not completely resolve. Patient was noted to have tachycardia that developed. Patient  also was noted to be hyperglycemic. Patient has a history of diabetes but has only ever treated this with metformin provided by the emergency department. She has no primary care provider. Patient did have CBC that was within normal limits. She had glucosuria and ketones on her urine but no anion gap. Patient's fingerstick improved 316 with 1 L normal saline IV bolus. I suspect this is worse due to the steroids she received this morning. Patient otherwise did not appear dehydrated and had a negative chest x-ray. She was not comfortable with being discharged home given how serious her angioedema has become in the past. She continues have improvement. She will be admitted for overnight observation.        Cyndra Numbers, MD 06/23/11 [phone]

## 2011-06-23 NOTE — ED Notes (Signed)
Attempt to call report. Nurse not available.

## 2011-06-23 NOTE — ED Notes (Signed)
Pt presents w/ angioedema w/ hx of same. Pt has been intubated as a result of this in the past.

## 2011-06-23 NOTE — ED Notes (Signed)
Report to [facility] pt ready for transport

## 2011-06-24 DIAGNOSIS — I10 Essential (primary) hypertension: Secondary | ICD-10-CM | POA: Diagnosis not present

## 2011-06-24 DIAGNOSIS — IMO0002 Reserved for concepts with insufficient information to code with codable children: Secondary | ICD-10-CM | POA: Diagnosis not present

## 2011-06-24 DIAGNOSIS — L2089 Other atopic dermatitis: Secondary | ICD-10-CM | POA: Diagnosis not present

## 2011-06-24 DIAGNOSIS — T783XXA Angioneurotic edema, initial encounter: Secondary | ICD-10-CM | POA: Diagnosis not present

## 2011-06-24 DIAGNOSIS — E1165 Type 2 diabetes mellitus with hyperglycemia: Secondary | ICD-10-CM | POA: Diagnosis not present

## 2011-06-24 LAB — C4 COMPLEMENT: Complement C4, Body Fluid: 22 mg/dL (ref 10–40)

## 2011-06-24 LAB — HEMOGLOBIN A1C
Hgb A1c MFr Bld: 9.1 % — ABNORMAL HIGH (ref <5.7)
Mean Plasma Glucose: 214 mg/dL — ABNORMAL HIGH (ref <117)

## 2011-06-24 LAB — BASIC METABOLIC PANEL
CO2: 22 mEq/L (ref 19–32)
Calcium: 8.6 mg/dL (ref 8.4–10.5)
Creatinine, Ser: 0.73 mg/dL (ref 0.50–1.10)
GFR calc Af Amer: >90 mL/min (ref >90)
GFR calc non Af Amer: 86 mL/min — ABNORMAL LOW (ref >90)
Sodium: 137 mEq/L (ref 135–145)

## 2011-06-24 LAB — CBC
HCT: 36.3 % (ref 36.0–46.0)
Hemoglobin: 12.9 g/dL (ref 12.0–15.0)
MCV: 86.2 fL (ref 78.0–100.0)
RBC: 4.21 MIL/uL (ref 3.87–5.11)
RDW: 13.1 % (ref 11.5–15.5)
WBC: 12.2 10*3/uL — ABNORMAL HIGH (ref 4.0–10.5)

## 2011-06-24 LAB — GLUCOSE, CAPILLARY
Glucose-Capillary: 244 mg/dL — ABNORMAL HIGH (ref 70–99)
Glucose-Capillary: 264 mg/dL — ABNORMAL HIGH (ref 70–99)

## 2011-06-24 LAB — TSH: TSH: 0.919 u[IU]/mL (ref 0.350–4.500)

## 2011-06-24 MED ORDER — GLIPIZIDE 10 MG PO TABS: 10.0000 mg | ORAL_TABLET | Freq: Two times a day (BID) | ORAL | Status: DC

## 2011-06-24 MED ORDER — BETAMETHASONE VALERATE 0.1 % EX LOTN: TOPICAL_LOTION | Freq: Two times a day (BID) | CUTANEOUS | Status: DC

## 2011-06-24 MED ORDER — METFORMIN HCL 500 MG PO TABS: 1000.0000 mg | ORAL_TABLET | Freq: Two times a day (BID) | ORAL | Status: DC

## 2011-06-24 MED ORDER — HYDROXYZINE HCL 50 MG PO TABS: 50.0000 mg | ORAL_TABLET | Freq: Four times a day (QID) | ORAL | Status: DC | PRN

## 2011-06-24 MED ORDER — CARVEDILOL 25 MG PO TABS: 25.0000 mg | ORAL_TABLET | Freq: Two times a day (BID) | ORAL | Status: DC

## 2011-06-24 MED FILL — Betamethasone Valerate Lotion 0.1% (Base Equivalent): CUTANEOUS | Qty: 60 | Status: AC

## 2011-06-24 NOTE — Progress Notes (Signed)
Talked to patient about DCP/ follow up care. Patient has seen Dr Debby Bud in the past and is thinking about seeing him again. Instructed the patient on the importance of having a primary care doctor and also gave her the number to [facility] to find a primary care doctor that are accepting new patients. Patient stated that she would call Dr Arthur Holms and try to get back into his practice. Abelino Derrick RN, BSN, [employer]

## 2011-06-24 NOTE — Discharge Instructions (Signed)
Angioedema Angioedema (AE) is a sudden swelling of the eyelids, lips, lobes of ears, external genitalia, skin, and other parts of the body. AE can happen by itself. It usually begins during the night and is found on awakening. It can happen with hives and other allergic reactions. Attacks can be mild and annoying, or life-threatening if the air passages swell. AE generally occurs in a short time period (over minutes to hours) and gets better in 24 to 48 hours. It usually does not cause any serious problems.  There are 2 different kinds of AE:   Allergic AE.   Nonallergic AE.   There may be an overreaction or direct stimulation of cells that are a part of the immune system (mast cells).   There may be problems with the release of chemicals made by the body that cause swelling and inflammation (kinins). AE due to kinins can be inherited from parents (hereditary), or it can develop on its own (acquired). Acquired AE either shows up before, or along with, certain diseases or is due to the body's immune system attacking parts of the body's own cells (autoimmune).  CAUSES  Allergic  AE due to allergic reactions are caused by something that causes the body to react (trigger). Common triggers include:   Foods.   Medicines.   Latex.   Direct contact with certain fruits, vegetables, or animal saliva.   Insect stings.  Nonallergic  Mast cell stimulation may be caused by:   Medicines.   Dyes used in X-rays.   The body's own immune system reactions to parts of the body (autoimmune disease).   Possibly, some virus infections.   AE due to problems with kinins can be hereditary or acquired. Attacks are triggered by:   Mild injury.   Dental work or any surgery.   Stress.   Sudden changes in temperature.   Exercise.   Medicines.   AE due to problems with kinins can also be due to certain medicines, especially blood pressure medicines like angiotensin-converting enzyme (ACE)  inhibitors. African Americans are at nearly 5 times greater risk of developing AE than Caucasians from ACE inhibitors.  SYMPTOMS  Allergic symptoms:  Non-itchy swelling of the skin. Often the swelling is on the face and lips, but any area of the skin can swell. Sometimes, the swelling can be painful. If hives are present, there is intense itching.   Breathing problems if the air passages swell.  Nonallergic symptoms:  If internal organs are involved, there may be:   Nausea.   Abdominal pain.   Vomiting.   Difficulty swallowing.   Difficulty passing urine.   Breathing problems if the air passages swell.  Depending on the cause of AE, episodes may:  Only happen once (if triggers are removed or avoided).   Come back in unpredictable patterns.   Repeat for several years and then gradually fade away.  DIAGNOSIS  AE is diagnosed by:   Asking questions to find out how fast the symptoms began.   Taking a family history.   Physical exam.   Diagnostic tests. Tests could include:   Allergy skin tests to see if the problem is allergic.   Blood tests to diagnose hereditary and some acquired types of AE.   Other tests to see if there is a hidden disease leading to the AE.  TREATMENT  Treatment depends on the type and cause (if any) of the AE. Allergic  Allergic types of AE are treated with:   Immediate removal of   the trigger or medicine (if any).   Epinephrine injection.   Steroids.   Antihistamines.   Hospitalization for severe attacks.  Nonallergic  Mast cell stimulation types of AE are treated with:   Immediate removal of the trigger or medicine (if any).   Epinephrine injection.   Steroids.   Antihistamines.   Hospitalization for severe attacks.   Hereditary AE is treated with:   Medicines to prevent and treat attacks. There is little response to antihistamines, epinephrine, or steroids.   Preventive medicines before dental work or surgery.    Removing or avoiding medicines that trigger attacks.   Hospitalization for severe attacks.   Acquired AE is treated with:   Treating underlying disease (if any).   Medicines to prevent and treat attacks.  HOME CARE INSTRUCTIONS   Always carry your emergency allergy treatment medicines with you.   Wear a medical bracelet.   Avoid known triggers.  SEEK MEDICAL CARE IF:   You get repeat attacks.   Your attacks are more frequent or more severe despite preventive measures.   You have hereditary AE and are considering having children. It is important to discuss the risks of passing this on to your children.  SEEK IMMEDIATE MEDICAL CARE IF:   You have difficulty breathing.   You have difficulty swallowing.   You experience fainting.  This condition should be treated immediately. It can be life-threatening if it involves throat swelling. Document Released: 05/06/2001 Document Revised: 02/14/2011 Document Reviewed: 02/25/2008 Exit[employer] Patient Information 2012 [employer]. 

## 2011-06-24 NOTE — Discharge Summary (Signed)
Physician Discharge Summary  Patient ID: Maria Fox MRN: [id number] DOB/AGE: 09/02/1945 66 y.o.  Admit date: 06/23/2011 Discharge date: 06/24/2011  Primary Care Physician:  Pcp Not In System   Discharge Diagnoses:   1-Angioedema of lips 2-HYPERTENSION, BENIGN SYSTEMIC 3-DIABETES MELLITUS II, UNCOMPLICATED 4-Vitiligo 5-Hyperglycemia 6-Dermatitis  Medication List  As of 06/24/2011 11:37 AM   STOP taking these medications         betamethasone (augmented) 0.05 % gel      ibuprofen 200 MG tablet      metoprolol tartrate 25 MG tablet      ondansetron 4 MG tablet         TAKE these medications         betamethasone valerate lotion 0.1 %   Commonly known as: VALISONE   Apply topically 2 (two) times daily. Apply on affected area twice a day      carvedilol 25 MG tablet   Commonly known as: COREG   Take 1 tablet (25 mg total) by mouth 2 (two) times daily with a meal.      diphenhydrAMINE 25 MG tablet   Commonly known as: BENADRYL   Take 25 mg by mouth every 6 (six) hours as needed. Itching for arm rash      EPINEPHrine 0.3 mg/0.3 mL Devi   Commonly known as: EPI-PEN   Inject 0.3 mLs (0.3 mg total) into the muscle as needed.      glipiZIDE 10 MG tablet   Commonly known as: GLUCOTROL   Take 1 tablet (10 mg total) by mouth 2 (two) times daily before a meal.      hydrOXYzine 50 MG tablet   Commonly known as: ATARAX/VISTARIL   Take 1 tablet (50 mg total) by mouth every 6 (six) hours as needed. For itching      metFORMIN 500 MG tablet   Commonly known as: GLUCOPHAGE   Take 2 tablets (1,000 mg total) by mouth 2 (two) times daily with a meal.             Disposition and Follow-up:  Patient discharge in stable and improved condition; currently with complete resolution of her angioedema; CBG's in the low 200 range and completely WNL vital signs. Patient has been instructed to be compliant with her medications; to follow a low carb diet and to arrange/establish care  with a PCP for outpatient follow up.  Consults:   None   Significant Diagnostic Studies:  Dg Chest 2 View  06/23/2011  *RADIOLOGY REPORT*  Clinical Data: Shortness of breath  CHEST - 2 VIEW  Comparison: 06/13/2011  Findings: The cardiomediastinal silhouette is unremarkable. Mild elevation of the right hemidiaphragm is again noted. There is no evidence of focal airspace disease, pulmonary edema, suspicious pulmonary nodule/mass, pleural effusion, or pneumothorax. No acute bony abnormalities are identified.  IMPRESSION: No evidence of acute cardiopulmonary disease.  Original Report Authenticated By: Rosendo Gros, M.D.    Brief H and P: 66 y/o female with PMH of DM, HTN, vitiligo, dermatitis and hx of angioedema (apparently autoimmune); came to ED due to lips swollen. Patient denies any fever, chills, any identified allergen or any other acute complaints. She also report that she has stop all her meds for the last 3-4 days (no explanatory reason given). In the ED she was treated with steroids and benadryl; swelling improved significantly. Since patient do not have any PCP for further follow up and the fact she was also found with tachycardia and hyperglycemia, [facility] was contacted  to admit patient for observation and safety.   Hospital Course:  1-Angioedema of lips: Completely resolved at discharge. Patient advised to take medications and to arrange follow up with PCP for further evaluation.  2-DIABETES MELLITUS II, UNCOMPLICATED: with hyperglycemia; 2/2 medication non-compliance and also use of steroids for treatment of angioedema. A1C 9.1; CBG's controlled after insulin therapy and after home meds were resume. She will receive prescription for home medications and will arrange/establish care with PCP for further medication adjustments. Patient advised to follow a low carb diet.  3-HYPERTENSION, BENIGN SYSTEMIC: will use coreg bid.   4-Dermatitis: continue topic steroids and also atarax to control  itching and prevent superinfection.   Time spent on Discharge: 35 minutes  Signed: Annica Marinello 06/24/2011, 11:37 AM

## 2011-06-24 NOTE — Progress Notes (Signed)
CHART REVIEWED; B Maliya Marich RN, BSN, MHA 

## 2011-06-24 NOTE — Progress Notes (Signed)
Patient discharge to home, husband at bedside. D/c instructions, follow up appointment done  and discussed with patient verbalized understanding.  PIV removed no s/s of infiltration ,no swelling on insertion site noted.

## 2011-06-25 LAB — C1 ESTERASE INHIBITOR: C1INH SerPl-mCnc: 29 mg/dL (ref 21–39)

## 2011-06-26 ENCOUNTER — Emergency Department (HOSPITAL_COMMUNITY)
Admission: EM | Admit: 2011-06-26 | Discharge: 2011-06-26 | Disposition: A | Discharge status: Home Health | Payer: Medicare Other | Attending: Emergency Medicine | Admitting: Emergency Medicine

## 2011-06-26 DIAGNOSIS — T783XXA Angioneurotic edema, initial encounter: Secondary | ICD-10-CM | POA: Insufficient documentation

## 2011-06-26 DIAGNOSIS — E119 Type 2 diabetes mellitus without complications: Secondary | ICD-10-CM | POA: Insufficient documentation

## 2011-06-26 DIAGNOSIS — Y92009 Unspecified place in unspecified non-institutional (private) residence as the place of occurrence of the external cause: Secondary | ICD-10-CM | POA: Insufficient documentation

## 2011-06-26 DIAGNOSIS — R0602 Shortness of breath: Secondary | ICD-10-CM | POA: Diagnosis not present

## 2011-06-26 DIAGNOSIS — I10 Essential (primary) hypertension: Secondary | ICD-10-CM | POA: Diagnosis not present

## 2011-06-26 DIAGNOSIS — X58XXXA Exposure to other specified factors, initial encounter: Secondary | ICD-10-CM | POA: Insufficient documentation

## 2011-06-26 MED ORDER — PREDNISONE 20 MG PO TABS: 40.0000 mg | ORAL_TABLET | Freq: Every day | ORAL | Status: DC

## 2011-06-26 MED ORDER — DIPHENHYDRAMINE HCL 25 MG PO TABS: 50.0000 mg | ORAL_TABLET | Freq: Four times a day (QID) | ORAL | Status: DC

## 2011-06-26 MED ORDER — FAMOTIDINE IN NACL 20-0.9 MG/50ML-% IV SOLN
20.0000 mg | Freq: Once | INTRAVENOUS | Status: AC
  Administered 2011-06-26: 20 mg via INTRAVENOUS
  Filled 2011-06-26: qty 50

## 2011-06-26 MED ORDER — METHYLPREDNISOLONE SODIUM SUCC 125 MG IJ SOLR
125.0000 mg | Freq: Once | INTRAMUSCULAR | Status: AC
  Administered 2011-06-26: 125 mg via INTRAVENOUS
  Filled 2011-06-26: qty 2

## 2011-06-26 MED ORDER — FAMOTIDINE 20 MG PO TABS: 20.0000 mg | ORAL_TABLET | Freq: Two times a day (BID) | ORAL | Status: DC

## 2011-06-26 MED ORDER — DIPHENHYDRAMINE HCL 50 MG/ML IJ SOLN
50.0000 mg | Freq: Once | INTRAMUSCULAR | Status: AC
  Administered 2011-06-26: 50 mg via INTRAVENOUS
  Filled 2011-06-26: qty 1

## 2011-06-26 NOTE — ED Provider Notes (Signed)
History     CSN: [id number]  Arrival date & time 06/26/11  0900   First MD Initiated Contact with Patient 06/26/11 [phone]      Chief Complaint  Patient presents with  . Angioedema  . Shortness of Breath    (Consider location/radiation/quality/duration/timing/severity/associated sxs/prior treatment) Patient is a 66 y.o. female presenting with allergic reaction. The history is provided by the patient.  Allergic Reaction The primary symptoms are  rash and angioedema. The primary symptoms do not include wheezing, shortness of breath, nausea or vomiting. The current episode started 3 to 5 hours ago. The problem has not changed since onset.This is a recurrent problem.  The rash is associated with itching.  The angioedema is not associated with shortness of breath or stridor.  Significant symptoms also include itching.  PT states woke up in the middle of the night with tongue and throat swelling. States was just discharged from the hospital for the same few days ago. Hx of the same in the past with intubation. Denies difficulty breathing, denies cough. Did not fill her epipen and did not take any medications after symptoms started.  Past Medical History  Diagnosis Date  . Hypertension   . Diabetes mellitus   . Multiple allergies   . Angioedema     Past Surgical History  Procedure Date  . Tonsillectomy 1958  . Orthopedic surgery     Family History  Problem Relation Age of Onset  . Hypertension Brother   . Hyperlipidemia Brother   . Asthma Brother   . Throat cancer Brother   . Diabetes type II Mother   . Hypertension Mother   . Hyperlipidemia Mother     History  Substance Use Topics  . Smoking status: Never Smoker   . Smokeless tobacco: Not on file  . Alcohol Use: No    OB History    Grav Para Term Preterm Abortions TAB SAB Ect Mult Living                  Review of Systems  Constitutional: Negative for fever and chills.  HENT: Negative for sore throat, trouble  swallowing, neck pain and neck stiffness.   Eyes: Negative.   Respiratory: Negative for shortness of breath, wheezing and stridor.   Cardiovascular: Negative.   Gastrointestinal: Negative for nausea and vomiting.  Genitourinary: Negative.   Musculoskeletal: Negative.   Skin: Positive for itching and rash.  Neurological: Negative.   Psychiatric/Behavioral: Negative.     Allergies  Codeine; Penicillins; and Sulfa antibiotics  Home Medications   Current Outpatient Rx  Name Route Sig Dispense Refill  . BETAMETHASONE VALERATE 0.1 % EX LOTN Topical Apply topically 2 (two) times daily. Apply on affected area twice a day 60 mL 1  . CARVEDILOL 25 MG PO TABS Oral Take 1 tablet (25 mg total) by mouth 2 (two) times daily with a meal. 60 tablet 1  . DIPHENHYDRAMINE HCL 25 MG PO TABS Oral Take 25 mg by mouth every 6 (six) hours as needed. Itching for arm rash    . EPINEPHRINE 0.3 MG/0.3ML IJ DEVI Intramuscular Inject 0.3 mLs (0.3 mg total) into the muscle as needed. 2 Device 0  . GLIPIZIDE 10 MG PO TABS Oral Take 1 tablet (10 mg total) by mouth 2 (two) times daily before a meal. 60 tablet 1  . HYDROXYZINE HCL 50 MG PO TABS Oral Take 1 tablet (50 mg total) by mouth every 6 (six) hours as needed. For itching 30 tablet 0  .  METFORMIN HCL 500 MG PO TABS Oral Take 2 tablets (1,000 mg total) by mouth 2 (two) times daily with a meal. 60 tablet 1    BP 161/101  Pulse 95  Temp(Src) 97.7 F (36.5 C) (Oral)  Resp 18  SpO2 97%  Physical Exam  Nursing note and vitals reviewed. Constitutional: She appears well-developed and well-nourished. No distress.  HENT:  Head: Normocephalic.  Right Ear: External ear normal.  Left Ear: External ear normal.  Nose: Nose normal.  Mouth/Throat: Oropharynx is clear and moist.       Mild swelling of the tongue noted. Uvula midline, non edematous.   Eyes: Conjunctivae are normal.  Neck: Neck supple. No tracheal deviation present.  Cardiovascular: Normal rate,  regular rhythm and normal heart sounds.   Pulmonary/Chest: Effort normal and breath sounds normal. No stridor. No respiratory distress.       No stridor  Abdominal: Soft. Bowel sounds are normal. She exhibits no distension.  Lymphadenopathy:    She has no cervical adenopathy.  Neurological: She displays abnormal reflex.  Skin: Skin is warm and dry.  Psychiatric: She has a normal mood and affect.    ED Course  Procedures (including critical care time) 9:00 AM Pt with tongue swelling. On exam, mild swelling of the tongue noted, otherwise normal.  NO stridor, no respiratory distress. Will administer steroids, benadryl, pepcid and will monitor.  1:17 PM Pt monitored for 4 hrs. Her tongue is almost back to normal size. She is feeling better. She is comfortable going home with medications. Follow up with her doctor and allergist few days.  Pt instructed to return if worsening.    1. Angioedema       MDM          Lottie Mussel, PA 06/26/11 1549

## 2011-06-26 NOTE — ED Notes (Signed)
Pt reports being released from hospital Monday for same. States swelling to mouth started this morning.

## 2011-06-26 NOTE — Discharge Instructions (Signed)
Continue benadryl, pepcid, prednisone as prescribed. Take next dose when you get your medications filled. Follow up with your doctor as soon as able, try to get in to see an allergist. Use your epi pen if feel like throat is closing and return to ER.  Angioedema Angioedema (AE) is a sudden swelling of the eyelids, lips, lobes of ears, external genitalia, skin, and other parts of the body. AE can happen by itself. It usually begins during the night and is found on awakening. It can happen with hives and other allergic reactions. Attacks can be mild and annoying, or life-threatening if the air passages swell. AE generally occurs in a short time period (over minutes to hours) and gets better in 24 to 48 hours. It usually does not cause any serious problems.  There are 2 different kinds of AE:   Allergic AE.   Nonallergic AE.   There may be an overreaction or direct stimulation of cells that are a part of the immune system (mast cells).   There may be problems with the release of chemicals made by the body that cause swelling and inflammation (kinins). AE due to kinins can be inherited from parents (hereditary), or it can develop on its own (acquired). Acquired AE either shows up before, or along with, certain diseases or is due to the body's immune system attacking parts of the body's own cells (autoimmune).  CAUSES  Allergic  AE due to allergic reactions are caused by something that causes the body to react (trigger). Common triggers include:   Foods.   Medicines.   Latex.   Direct contact with certain fruits, vegetables, or animal saliva.   Insect stings.  Nonallergic  Mast cell stimulation may be caused by:   Medicines.   Dyes used in X-rays.   The body's own immune system reactions to parts of the body (autoimmune disease).   Possibly, some virus infections.   AE due to problems with kinins can be hereditary or acquired. Attacks are triggered by:   Mild injury.   Dental  work or any surgery.   Stress.   Sudden changes in temperature.   Exercise.   Medicines.   AE due to problems with kinins can also be due to certain medicines, especially blood pressure medicines like angiotensin-converting enzyme (ACE) inhibitors. African Americans are at nearly 5 times greater risk of developing AE than Caucasians from ACE inhibitors.  SYMPTOMS  Allergic symptoms:  Non-itchy swelling of the skin. Often the swelling is on the face and lips, but any area of the skin can swell. Sometimes, the swelling can be painful. If hives are present, there is intense itching.   Breathing problems if the air passages swell.  Nonallergic symptoms:  If internal organs are involved, there may be:   Nausea.   Abdominal pain.   Vomiting.   Difficulty swallowing.   Difficulty passing urine.   Breathing problems if the air passages swell.  Depending on the cause of AE, episodes may:  Only happen once (if triggers are removed or avoided).   Come back in unpredictable patterns.   Repeat for several years and then gradually fade away.  DIAGNOSIS  AE is diagnosed by:   Asking questions to find out how fast the symptoms began.   Taking a family history.   Physical exam.   Diagnostic tests. Tests could include:   Allergy skin tests to see if the problem is allergic.   Blood tests to diagnose hereditary and some acquired types  of AE.   Other tests to see if there is a hidden disease leading to the AE.  TREATMENT  Treatment depends on the type and cause (if any) of the AE. Allergic  Allergic types of AE are treated with:   Immediate removal of the trigger or medicine (if any).   Epinephrine injection.   Steroids.   Antihistamines.   Hospitalization for severe attacks.  Nonallergic  Mast cell stimulation types of AE are treated with:   Immediate removal of the trigger or medicine (if any).   Epinephrine injection.   Steroids.   Antihistamines.    Hospitalization for severe attacks.   Hereditary AE is treated with:   Medicines to prevent and treat attacks. There is little response to antihistamines, epinephrine, or steroids.   Preventive medicines before dental work or surgery.   Removing or avoiding medicines that trigger attacks.   Hospitalization for severe attacks.   Acquired AE is treated with:   Treating underlying disease (if any).   Medicines to prevent and treat attacks.  HOME CARE INSTRUCTIONS   Always carry your emergency allergy treatment medicines with you.   Wear a medical bracelet.   Avoid known triggers.  SEEK MEDICAL CARE IF:   You get repeat attacks.   Your attacks are more frequent or more severe despite preventive measures.   You have hereditary AE and are considering having children. It is important to discuss the risks of passing this on to your children.  SEEK IMMEDIATE MEDICAL CARE IF:   You have difficulty breathing.   You have difficulty swallowing.   You experience fainting.  This condition should be treated immediately. It can be life-threatening if it involves throat swelling. Document Released: 05/06/2001 Document Revised: 02/14/2011 Document Reviewed: 02/25/2008 [facility] Patient Information 2012 [place], [place].

## 2011-06-27 NOTE — ED Provider Notes (Signed)
Medical screening examination/treatment/procedure(s) were performed by non-physician practitioner and as supervising physician I was immediately available for consultation/collaboration.  Raeford Razor, MD 06/27/11 [phone]

## 2011-07-24 DIAGNOSIS — Z961 Presence of intraocular lens: Secondary | ICD-10-CM | POA: Diagnosis not present

## 2011-07-24 DIAGNOSIS — E119 Type 2 diabetes mellitus without complications: Secondary | ICD-10-CM | POA: Diagnosis not present

## 2011-07-24 DIAGNOSIS — H2510 Age-related nuclear cataract, unspecified eye: Secondary | ICD-10-CM | POA: Diagnosis not present

## 2011-08-24 ENCOUNTER — Encounter (HOSPITAL_COMMUNITY): Payer: Self-pay | Admitting: Emergency Medicine

## 2011-08-24 ENCOUNTER — Emergency Department (HOSPITAL_COMMUNITY)
Admission: EM | Admit: 2011-08-24 | Discharge: 2011-08-24 | Disposition: A | Discharge status: Home / Self Care | Payer: Medicare Other | Attending: Emergency Medicine | Admitting: Emergency Medicine

## 2011-08-24 DIAGNOSIS — Z880 Allergy status to penicillin: Secondary | ICD-10-CM | POA: Insufficient documentation

## 2011-08-24 DIAGNOSIS — E119 Type 2 diabetes mellitus without complications: Secondary | ICD-10-CM | POA: Diagnosis not present

## 2011-08-24 DIAGNOSIS — R21 Rash and other nonspecific skin eruption: Secondary | ICD-10-CM | POA: Diagnosis not present

## 2011-08-24 DIAGNOSIS — I10 Essential (primary) hypertension: Secondary | ICD-10-CM | POA: Diagnosis not present

## 2011-08-24 DIAGNOSIS — Z882 Allergy status to sulfonamides status: Secondary | ICD-10-CM | POA: Diagnosis not present

## 2011-08-24 DIAGNOSIS — G629 Polyneuropathy, unspecified: Secondary | ICD-10-CM

## 2011-08-24 DIAGNOSIS — G609 Hereditary and idiopathic neuropathy, unspecified: Secondary | ICD-10-CM | POA: Diagnosis not present

## 2011-08-24 DIAGNOSIS — G5790 Unspecified mononeuropathy of unspecified lower limb: Secondary | ICD-10-CM | POA: Insufficient documentation

## 2011-08-24 MED ORDER — OXYCODONE-ACETAMINOPHEN 5-325 MG PO TABS: 2.0000 | ORAL_TABLET | Freq: Once | ORAL | Status: AC

## 2011-08-24 MED ORDER — PREDNISONE 20 MG PO TABS: 60.0000 mg | ORAL_TABLET | Freq: Once | ORAL | Status: AC

## 2011-08-24 MED ORDER — OXYCODONE-ACETAMINOPHEN 5-325 MG PO TABS
2.0000 | ORAL_TABLET | Freq: Once | ORAL | Status: AC
  Administered 2011-08-24: 2 via ORAL
  Filled 2011-08-24: qty 2

## 2011-08-24 MED ORDER — PREDNISONE 20 MG PO TABS
60.0000 mg | ORAL_TABLET | Freq: Once | ORAL | Status: AC
  Administered 2011-08-24: 60 mg via ORAL
  Filled 2011-08-24: qty 3

## 2011-08-24 MED ORDER — GABAPENTIN 300 MG PO CAPS
300.0000 mg | ORAL_CAPSULE | Freq: Three times a day (TID) | ORAL | Status: DC
  Filled 2011-08-24 (×3): qty 1

## 2011-08-24 MED ORDER — GABAPENTIN 300 MG PO CAPS: 300.0000 mg | ORAL_CAPSULE | Freq: Three times a day (TID) | ORAL | Status: DC

## 2011-08-24 NOTE — Discharge Instructions (Signed)
Please follow up with your dermatologist and/or your new PCP.  Take medications as prescribed.  Return to the ER for worsening condition or new concerning symptoms. Peripheral Neuropathy Peripheral neuropathy is a common disorder of your nerves resulting from damage. CAUSES  This disorder may be caused by a disease of the nerves or illness. Many neuropathies have well known causes such as:  Diabetes. This is one of the most common causes.   Uremia.   AIDS.   Nutritional deficiencies.   Other causes include mechanical pressures. These may be from:   Compression.   Injury.   Contusions or bruises.   Fracture or dislocated bones.   Pressure involving the nerves close to the surface. Nerves such as the ulnar, or radial can be injured by prolonged use of crutches.  Other injuries may come from:  Tumor.   Hemorrhage or bleeding into a nerve.   Exposure to cold or radiation.   Certain medicines or toxic substances (rare).   Vascular or collagen disorders such as:   Atherosclerosis.   Systemic lupus erythematosus.   Scleroderma.   Sarcoidosis.   Rheumatoid arthritis.   Polyarteritis nodosa.   A large number of cases are of unknown cause.  SYMPTOMS  Common problems include:  Weakness.   Numbness.   Abnormal sensations (paresthesia) such as:   Burning.   Tickling.   Pricking.   Tingling.   Pain in the arms, hands, legs and/or feet.  TREATMENT  Therapy for this disorder differs depending on the cause. It may vary from medical treatment with medications or physical therapy among others.   For example, therapy for this disorder caused by diabetes involves control of the diabetes.   In cases where a tumor or ruptured disc is the cause, therapy may involve surgery. This would be to remove the tumor or to repair the ruptured disc.   In entrapment or compression neuropathy, treatment may consist of splinting or surgical decompression of the ulnar or median  nerves. A common example of entrapment neuropathy is carpal tunnel syndrome. This has become more common because of the increasing use of computers.   Peroneal and radial compression neuropathies may require avoidance of pressure.   Physical therapy and/or splints may be useful in preventing contractures. This is a condition in which shortened muscles around joints cause abnormal and sometimes painful positioning of the joints.  Document Released: 02/15/2002 Document Revised: 11/07/2010 Document Reviewed: 02/25/2005 [facility] Patient Information 2012 [place], [place].  Rash A rash is a change in the color or texture of your skin. There are many different types of rashes. You may have other problems that accompany your rash. CAUSES   Infections.   Allergic reactions. This can include allergies to pets or foods.   Certain medicines.   Exposure to certain chemicals, soaps, or cosmetics.   Heat.   Exposure to poisonous plants.   Tumors, both cancerous and noncancerous.  SYMPTOMS   Redness.   Scaly skin.   Itchy skin.   Dry or cracked skin.   Bumps.   Blisters.   Pain.  DIAGNOSIS  Your caregiver may do a physical exam to determine what type of rash you have. A skin sample (biopsy) may be taken and examined under a microscope. TREATMENT  Treatment depends on the type of rash you have. Your caregiver may prescribe certain medicines. For serious conditions, you may need to see a skin doctor (dermatologist). HOME CARE INSTRUCTIONS   Avoid the substance that caused your rash.  Do not scratch your rash. This can cause infection.   You may take cool baths to help stop itching.   Only take over-the-counter or prescription medicines as directed by your caregiver.   Keep all follow-up appointments as directed by your caregiver.  SEEK IMMEDIATE MEDICAL CARE IF:  You have increasing pain, swelling, or redness.   You have a fever.   You have new or severe symptoms.   You  have body aches, diarrhea, or vomiting.   Your rash is not better after 3 days.  MAKE SURE YOU:  Understand these instructions.   Will watch your condition.   Will get help right away if you are not doing well or get worse.  Document Released: 02/15/2002 Document Revised: 02/14/2011 Document Reviewed: 12/10/2010 [facility] Patient Information 2012 [place], [place].

## 2011-08-24 NOTE — ED Notes (Signed)
Pt. Claimed of having rashes on some parts of her body and mostly on bilateral lower legs. Pt. also stated that she has these rashes  Which started on 2011 but experiencing excruciating pain on bilateral lower legs for 2 days now.

## 2011-08-28 NOTE — ED Provider Notes (Signed)
History     CSN: [id number]  Arrival date & time 08/24/11  0344   First MD Initiated Contact with Patient 08/24/11 [phone]      Chief Complaint  Patient presents with  . Rash  . Leg Pain    (Consider location/radiation/quality/duration/timing/severity/associated sxs/prior treatment) HPI 66 yo female presents to the ER with complaint of ongoing rash and bilateral lower leg pain.  Pt reports she has had rash to legs, abd, chest, and arms since 2011.  She has seen several doctors for the rash and has been told it is psoriasis.  She also has h/o shingles.  Pt is on steroid cream for rash.  Pt c/o persistent rash.  Pt also c/o bilateral leg pain, worse after standing for long periods at her job.  Pain is from hips to toes but worse from knees down.  Pain is described as burning, itching crawling sensation.  Pt is a diabetic.  Pain somewhat better when sitting down and elevating the legs, but is always present.  It has been present for some time, but worse tonight.   Past Medical History  Diagnosis Date  . Hypertension   . Diabetes mellitus   . Multiple allergies   . Angioedema     Past Surgical History  Procedure Date  . Tonsillectomy 1958  . Orthopedic surgery     Family History  Problem Relation Age of Onset  . Hypertension Brother   . Hyperlipidemia Brother   . Asthma Brother   . Throat cancer Brother   . Diabetes type II Mother   . Hypertension Mother   . Hyperlipidemia Mother     History  Substance Use Topics  . Smoking status: Never Smoker   . Smokeless tobacco: Not on file  . Alcohol Use: No    OB History    Grav Para Term Preterm Abortions TAB SAB Ect Mult Living                  Review of Systems  All other systems reviewed and are negative.    Allergies  Codeine; Penicillins; and Sulfa antibiotics  Home Medications   Current Outpatient Rx  Name Route Sig Dispense Refill  . BETAMETHASONE VALERATE 0.1 % EX LOTN Topical Apply topically 2 (two) times  daily. Apply on affected area twice a day 60 mL 1  . DIPHENHYDRAMINE HCL 25 MG PO TABS Oral Take 25 mg by mouth every 6 (six) hours as needed. Itching for arm rash    . EPINEPHRINE 0.3 MG/0.3ML IJ DEVI Intramuscular Inject 0.3 mLs (0.3 mg total) into the muscle as needed. 2 Device 0  . FAMOTIDINE 20 MG PO TABS Oral Take 1 tablet (20 mg total) by mouth 2 (two) times daily. 30 tablet 0  . GLIPIZIDE 10 MG PO TABS Oral Take 10 mg by mouth daily.    Marland Kitchen HYDROXYZINE HCL 50 MG PO TABS Oral Take 1 tablet (50 mg total) by mouth every 6 (six) hours as needed. For itching 30 tablet 0  . METFORMIN HCL 500 MG PO TABS Oral Take 500 mg by mouth 2 (two) times daily with a meal.    . METOPROLOL TARTRATE 25 MG PO TABS Oral Take 12.5 mg by mouth 2 (two) times daily.    Marland Kitchen ONDANSETRON HCL 4 MG PO TABS Oral Take 4 mg by mouth every 8 (eight) hours as needed. Nausea    . GABAPENTIN 300 MG PO CAPS Oral Take 1 capsule (300 mg total) by mouth 3 (  three) times daily. 30 capsule 0  . OXYCODONE-ACETAMINOPHEN 5-325 MG PO TABS Oral Take 2 tablets by mouth once. 30 tablet 0  . PREDNISONE 20 MG PO TABS Oral Take 3 tablets (60 mg total) by mouth once. 15 tablet 0    BP 155/88  Pulse 93  Temp 97.8 F (36.6 C) (Oral)  Resp 16  Ht 5\' 2"  (1.575 m)  Wt 135 lb (61.236 kg)  BMI 24.69 kg/m2  SpO2 99%  Physical Exam  Nursing note and vitals reviewed. Constitutional: She is oriented to person, place, and time. She appears well-developed and well-nourished. She appears distressed (uncomfortable appearing).  Musculoskeletal: Normal range of motion. She exhibits tenderness (tenderness diffusely to bilateral lower legs, decreased sensation in stocking pattern bilaterally to knees). She exhibits no edema.  Neurological: She is alert and oriented to person, place, and time. She has normal reflexes. No cranial nerve deficit. She exhibits normal muscle tone. Coordination normal.  Skin: Skin is warm and dry. Rash: macular papular rash to  bilateral legs, trunk.  Secondary excoriations, no secondary infection noted.  No erythema. No pallor.    ED Course  Procedures (including critical care time)  Labs Reviewed - No data to display No results found.   1. Rash   2. Peripheral neuropathy       MDM  66 yo with chronic rash, most likely psoratic.  Pt with leg pain that seems neuropathic.  Will treat with neurontin, percocet, prednisone.        Olivia Mackie, MD 08/28/11 [phone]

## 2011-09-24 ENCOUNTER — Ambulatory Visit (INDEPENDENT_AMBULATORY_CARE_PROVIDER_SITE_OTHER): Payer: Medicare Other | Admitting: Family Medicine

## 2011-09-24 ENCOUNTER — Encounter: Payer: Self-pay | Admitting: Family Medicine

## 2011-09-24 VITALS — BP 136/82 | HR 80 | Temp 98.0°F | Ht 62.5 in | Wt 139.0 lb

## 2011-09-24 DIAGNOSIS — T783XXA Angioneurotic edema, initial encounter: Secondary | ICD-10-CM

## 2011-09-24 DIAGNOSIS — E119 Type 2 diabetes mellitus without complications: Secondary | ICD-10-CM | POA: Diagnosis not present

## 2011-09-24 DIAGNOSIS — E785 Hyperlipidemia, unspecified: Secondary | ICD-10-CM

## 2011-09-24 DIAGNOSIS — L80 Vitiligo: Secondary | ICD-10-CM | POA: Diagnosis not present

## 2011-09-24 DIAGNOSIS — L259 Unspecified contact dermatitis, unspecified cause: Secondary | ICD-10-CM

## 2011-09-24 DIAGNOSIS — N951 Menopausal and female climacteric states: Secondary | ICD-10-CM

## 2011-09-24 DIAGNOSIS — L309 Dermatitis, unspecified: Secondary | ICD-10-CM

## 2011-09-24 DIAGNOSIS — I10 Essential (primary) hypertension: Secondary | ICD-10-CM

## 2011-09-24 MED ORDER — HYDROXYZINE HCL 50 MG PO TABS: 50.0000 mg | ORAL_TABLET | Freq: Four times a day (QID) | ORAL | Status: DC | PRN

## 2011-09-24 NOTE — Patient Instructions (Signed)
I will request records from dermatology and likely end up setting you up with [facility] dermatology clinic. Return at your convenience fasting for blood work and afterwards for medicare wellness exam - pap smear, mammogram, breast exam, etc Good to meet you today, call us with questions. We will check diabetes control at blood work.
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
## 2011-09-26 ENCOUNTER — Encounter: Payer: Self-pay | Admitting: Family Medicine

## 2011-09-26 DIAGNOSIS — E1169 Type 2 diabetes mellitus with other specified complication: Secondary | ICD-10-CM | POA: Insufficient documentation

## 2011-09-26 DIAGNOSIS — E785 Hyperlipidemia, unspecified: Secondary | ICD-10-CM | POA: Insufficient documentation

## 2011-09-26 NOTE — Assessment & Plan Note (Signed)
Chronic, continued active lesions.  Has betamethasone lotion she uses.

## 2011-09-26 NOTE — Assessment & Plan Note (Signed)
Will be due for dexa if not already done.

## 2011-09-26 NOTE — Assessment & Plan Note (Signed)
Chronic, complaint with meds.  continue.

## 2011-09-26 NOTE — Assessment & Plan Note (Signed)
Unclear etiology of pruritic blistering dermatitis.  Have requested records from dermatologists, [facility]. Right hip lesions could very well be shingles, but outside of window for antiviral.  Using percocets prn pain. Will check liver function when returns for medicare wellness check. Al rashes are very pruritic - on several meds for this.  Refilled hydroxyzine.

## 2011-09-26 NOTE — Assessment & Plan Note (Signed)
Has had several episodes in last year - first one (11/2010) necessitating hospital admission and ICU monitoring 2/2 airway compromise from swelling. Carries Epipen. At next appt will refer to allergist to establish and for further evaluation.

## 2011-09-26 NOTE — Assessment & Plan Note (Signed)
Lab Results  Component Value Date   HGBA1C 9.1* 06/23/2011  not at goal based on last A1c, however pt states better control recently.  Will check A1c when she returns for wellness visit. Reports compliance with meds.

## 2011-09-28 ENCOUNTER — Encounter: Payer: Self-pay | Admitting: Family Medicine

## 2011-10-17 ENCOUNTER — Other Ambulatory Visit: Payer: Self-pay

## 2011-10-17 MED ORDER — OXYCODONE-ACETAMINOPHEN 5-325 MG PO TABS: 1.0000 | ORAL_TABLET | Freq: Two times a day (BID) | ORAL | Status: DC | PRN

## 2011-10-17 NOTE — Telephone Encounter (Signed)
Rx printed but not signed before Dr. Reece Agar left for the day. Will forward to Dr. Para March to see if he will fill.

## 2011-10-17 NOTE — Telephone Encounter (Signed)
Printed, I'll sign and then give to patient.  Notify Dr. Reece Agar as a FYI.  Old rx destroyed.  Thanks.

## 2011-10-17 NOTE — Telephone Encounter (Signed)
Placed in Kim's box.  rec try 1/2 pill prn.

## 2011-10-17 NOTE — Telephone Encounter (Signed)
Pt hurting both legs, arms and hips due to immune problem(pt said Dr Reece Agar is aware). Given percocet in ER 08/24/11 which helps with pain. Pt request rx for percocet. Call when ready for pick up if can refill.Please advise. Pt is out of med.

## 2011-10-18 NOTE — Telephone Encounter (Signed)
Message left advising patient and Rx placed up front for pick up. 

## 2011-10-21 ENCOUNTER — Other Ambulatory Visit (INDEPENDENT_AMBULATORY_CARE_PROVIDER_SITE_OTHER): Payer: Medicare Other

## 2011-10-21 DIAGNOSIS — I10 Essential (primary) hypertension: Secondary | ICD-10-CM

## 2011-10-21 DIAGNOSIS — L309 Dermatitis, unspecified: Secondary | ICD-10-CM

## 2011-10-21 DIAGNOSIS — E119 Type 2 diabetes mellitus without complications: Secondary | ICD-10-CM | POA: Diagnosis not present

## 2011-10-21 DIAGNOSIS — L259 Unspecified contact dermatitis, unspecified cause: Secondary | ICD-10-CM

## 2011-10-21 DIAGNOSIS — T783XXA Angioneurotic edema, initial encounter: Secondary | ICD-10-CM

## 2011-10-21 DIAGNOSIS — E785 Hyperlipidemia, unspecified: Secondary | ICD-10-CM

## 2011-10-21 LAB — COMPREHENSIVE METABOLIC PANEL
BUN: 13 mg/dL (ref 6–23)
CO2: 28 mEq/L (ref 19–32)
Creatinine, Ser: 0.9 mg/dL (ref 0.4–1.2)
GFR: 65.23 mL/min (ref >60.00)
Glucose, Bld: 192 mg/dL — ABNORMAL HIGH (ref 70–99)
Total Bilirubin: 0.8 mg/dL (ref 0.3–1.2)

## 2011-10-21 LAB — CBC WITH DIFFERENTIAL/PLATELET
Eosinophils Relative: 4.5 % (ref 0.0–5.0)
HCT: 43.9 % (ref 36.0–46.0)
Hemoglobin: 14.4 g/dL (ref 12.0–15.0)
Lymphs Abs: 2 10*3/uL (ref 0.7–4.0)
Monocytes Relative: 7.5 % (ref 3.0–12.0)
Neutro Abs: 3.8 10*3/uL (ref 1.4–7.7)
Platelets: 238 10*3/uL (ref 150.0–400.0)
RBC: 4.9 Mil/uL (ref 3.87–5.11)
WBC: 6.6 10*3/uL (ref 4.5–10.5)

## 2011-10-21 LAB — HEMOGLOBIN A1C: Hgb A1c MFr Bld: 9.4 % — ABNORMAL HIGH (ref 4.6–6.5)

## 2011-10-21 LAB — LIPID PANEL
Cholesterol: 211 mg/dL — ABNORMAL HIGH (ref 0–200)
Triglycerides: 158 mg/dL — ABNORMAL HIGH (ref 0.0–149.0)

## 2011-10-21 LAB — LDL CHOLESTEROL, DIRECT: Direct LDL: 134.9 mg/dL

## 2011-10-21 LAB — MICROALBUMIN / CREATININE URINE RATIO: Microalb, Ur: 1.7 mg/dL (ref 0.0–1.9)

## 2011-10-23 ENCOUNTER — Telehealth: Payer: Self-pay

## 2011-10-23 NOTE — Telephone Encounter (Signed)
Pt called about recent lab results; pt has appt 10/28/11 with Dr Sharen Hones; pt said OK to wait and talk with Dr Reece Agar then.

## 2011-10-25 DIAGNOSIS — H0019 Chalazion unspecified eye, unspecified eyelid: Secondary | ICD-10-CM | POA: Diagnosis not present

## 2011-10-28 ENCOUNTER — Ambulatory Visit (INDEPENDENT_AMBULATORY_CARE_PROVIDER_SITE_OTHER): Payer: Medicare Other | Admitting: Family Medicine

## 2011-10-28 ENCOUNTER — Encounter: Payer: Self-pay | Admitting: Family Medicine

## 2011-10-28 VITALS — BP 160/90 | HR 94 | Temp 97.9°F | Resp 20 | Ht 63.0 in | Wt 133.0 lb

## 2011-10-28 DIAGNOSIS — I10 Essential (primary) hypertension: Secondary | ICD-10-CM | POA: Diagnosis not present

## 2011-10-28 DIAGNOSIS — Z1231 Encounter for screening mammogram for malignant neoplasm of breast: Secondary | ICD-10-CM | POA: Diagnosis not present

## 2011-10-28 DIAGNOSIS — Z0000 Encounter for general adult medical examination without abnormal findings: Secondary | ICD-10-CM | POA: Diagnosis not present

## 2011-10-28 DIAGNOSIS — Z1211 Encounter for screening for malignant neoplasm of colon: Secondary | ICD-10-CM

## 2011-10-28 DIAGNOSIS — E119 Type 2 diabetes mellitus without complications: Secondary | ICD-10-CM | POA: Diagnosis not present

## 2011-10-28 DIAGNOSIS — E785 Hyperlipidemia, unspecified: Secondary | ICD-10-CM

## 2011-10-28 DIAGNOSIS — L309 Dermatitis, unspecified: Secondary | ICD-10-CM

## 2011-10-28 MED ORDER — BETAMETHASONE VALERATE 0.1 % EX LOTN: 1.0000 "application " | TOPICAL_LOTION | Freq: Two times a day (BID) | CUTANEOUS | Status: DC

## 2011-10-28 NOTE — Assessment & Plan Note (Signed)
Deteriorated.  A1c increased.  Will increase metformin to 500mg  am, 1000mg  pm.  Recheck in 3 months.  Consider addition of januvia.
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
## 2011-10-28 NOTE — Assessment & Plan Note (Signed)
Discussed if not improving, recommend f/u with derm (Dr. Massie Maroon)

## 2011-10-28 NOTE — Assessment & Plan Note (Signed)
reviewed numbers.  LDL above goal of 100. Discussed low chol diet, provided with handout. Recheck in 6 mo, discussed possible addition of statin.

## 2011-10-28 NOTE — Assessment & Plan Note (Signed)
I have personally reviewed the Medicare Annual Wellness questionnaire and have noted 1. The patient's medical and social history 2. Their use of alcohol, tobacco or illicit drugs 3. Their current medications and supplements 4. The patient's functional ability including ADL's, fall risks, home safety risks and hearing or visual impairment. 5. Diet and physical activity 6. Evidence for depression or mood disorders The patients weight, height, BMI have been recorded in the chart.  Hearing and vision has been addressed. I have made referrals, counseling and provided education to the patient based review of the above and I have provided the pt with a written personalized care plan for preventive services. See scanned questionairre.  Reviewed preventative protocols and updated unless pt declined. iFOB and mammogram scheduled. Discussed cervical cancer screening, will stop screening. Discussed healthy diet/lifestyle.

## 2011-10-28 NOTE — Patient Instructions (Addendum)
Call your insurace about the shingles shot to see if it is covered or how much it would cost and where is cheaper (here or pharmacy).  If you want to receive here, call for nurse visit.  Stool kit today. Pass by [facility]'s office to schedule mammogram. Increase metformin to 1 pill in am and 2 pills in afternoon. Return in 3 months for recheck diabetes - watch added sugars and sweets. Work on lowering bad cholesterol - diet handout provided today. Good to see you today, call us with quesitons.

## 2011-10-28 NOTE — Assessment & Plan Note (Signed)
Chronic.  Elevated because rushing here per pt.  Recheck next visit.

## 2011-10-29 ENCOUNTER — Telehealth: Payer: Self-pay

## 2011-10-29 NOTE — Telephone Encounter (Signed)
pt forgot to ask Dr Sharen Hones when seen 10/28/11 for med to help her get to sleep at night. Pt does not have problem waking during the night but has problems going to sleep when she goes to bed.[facility].Please advise.

## 2011-10-29 NOTE — Telephone Encounter (Signed)
plz notify I recommend she try melatonin OTC first.  If that's not working to let us know.

## 2011-10-30 NOTE — Telephone Encounter (Signed)
Advised patient

## 2011-11-05 ENCOUNTER — Telehealth: Payer: Self-pay

## 2011-11-05 NOTE — Telephone Encounter (Signed)
Pt needs name of shingles vaccine. Maria Fox was name given to pt.

## 2011-11-19 ENCOUNTER — Other Ambulatory Visit: Payer: Medicare Other

## 2011-11-19 ENCOUNTER — Encounter: Payer: Self-pay | Admitting: *Deleted

## 2011-11-19 DIAGNOSIS — Z1211 Encounter for screening for malignant neoplasm of colon: Secondary | ICD-10-CM

## 2011-11-20 ENCOUNTER — Telehealth: Payer: Self-pay | Admitting: Family Medicine

## 2011-11-20 NOTE — Telephone Encounter (Signed)
Patient is taking Percocet 2 x a day.  Her last refill was 10/21/11.  Patient has 3-4 pills left.  Patient said she doesn't take them every day twice a day.  Patient needs a written rx.  Please call patient when rx is ready to be picked up. Patient tried to call [facility] to have them request a refill,but [facility] told her she had to call here.

## 2011-11-20 NOTE — Addendum Note (Signed)
Addended by: Eustaquio Boyden on: 11/20/2011 04:55 PM   Modules accepted: Orders

## 2011-11-21 ENCOUNTER — Ambulatory Visit: Payer: Medicare Other

## 2011-11-21 MED ORDER — OXYCODONE-ACETAMINOPHEN 5-325 MG PO TABS: 1.0000 | ORAL_TABLET | Freq: Two times a day (BID) | ORAL | Status: DC | PRN

## 2011-11-21 NOTE — Telephone Encounter (Signed)
Left message on voicemail letting her know her Rx is ready for pick up

## 2011-11-21 NOTE — Telephone Encounter (Signed)
Printed script and placed in Kim's box. 

## 2011-11-21 NOTE — Telephone Encounter (Signed)
calle

## 2011-11-21 NOTE — Addendum Note (Signed)
Addended by: Eustaquio Boyden on: 11/21/2011 07:42 AM   Modules accepted: Orders

## 2011-12-06 ENCOUNTER — Other Ambulatory Visit: Payer: Self-pay | Admitting: *Deleted

## 2011-12-06 ENCOUNTER — Ambulatory Visit: Payer: Medicare Other | Admitting: Internal Medicine

## 2011-12-06 NOTE — Telephone Encounter (Signed)
Attempted to call patient. "mailbox full". Could not leave message. Will try again later.

## 2011-12-06 NOTE — Telephone Encounter (Signed)
This medicine was prescribed for R hip pain, thought due to shingles.  This shouldn't be chronic daily medication, if needing so frequently I'd like her to come in to further evaluate R hip pain.

## 2011-12-06 NOTE — Telephone Encounter (Signed)
Patient called requesting refill. She said she is taking 1 BID and 30 tabs is only lasting her 15 days. She requests # 60. She would like to pick up Rx today if possible.

## 2011-12-09 NOTE — Telephone Encounter (Signed)
Patient left a message on voicemail requesting a refill on Percocet for #60. Tried to call patient back, "mailbox full:". Will try again later.

## 2011-12-09 NOTE — Telephone Encounter (Signed)
Patient notified as instructed by telephone. Follow-up appointment scheduled. 

## 2011-12-11 ENCOUNTER — Other Ambulatory Visit: Payer: Self-pay | Admitting: Family Medicine

## 2011-12-11 ENCOUNTER — Ambulatory Visit: Payer: Medicare Other | Admitting: Family Medicine

## 2011-12-17 ENCOUNTER — Ambulatory Visit (INDEPENDENT_AMBULATORY_CARE_PROVIDER_SITE_OTHER): Payer: Medicare Other | Admitting: Family Medicine

## 2011-12-17 ENCOUNTER — Encounter: Payer: Self-pay | Admitting: Family Medicine

## 2011-12-17 VITALS — BP 164/94 | HR 88 | Temp 97.5°F | Resp 20 | Ht 63.0 in | Wt 137.0 lb

## 2011-12-17 DIAGNOSIS — M25551 Pain in right hip: Secondary | ICD-10-CM | POA: Insufficient documentation

## 2011-12-17 DIAGNOSIS — Z23 Encounter for immunization: Secondary | ICD-10-CM

## 2011-12-17 DIAGNOSIS — M25559 Pain in unspecified hip: Secondary | ICD-10-CM | POA: Diagnosis not present

## 2011-12-17 DIAGNOSIS — L309 Dermatitis, unspecified: Secondary | ICD-10-CM

## 2011-12-17 DIAGNOSIS — L259 Unspecified contact dermatitis, unspecified cause: Secondary | ICD-10-CM | POA: Diagnosis not present

## 2011-12-17 MED ORDER — TRAMADOL HCL 50 MG PO TABS: 50.0000 mg | ORAL_TABLET | Freq: Three times a day (TID) | ORAL | Status: DC | PRN

## 2011-12-17 NOTE — Patient Instructions (Addendum)
Flu shot today. Let's increase gabapentin to 4 times a day. Let's try tramadol for this pain to see if will help. Let me know how this works. [facility] rash.
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
## 2011-12-17 NOTE — Assessment & Plan Note (Signed)
Pt does not desire to f/u with Dr. Terri Piedra or Massie Maroon (who she had seen prior for this rash). Has had unrevealing skin biopsy and cultures per pt. Will refer to [facility] per pt preference for further evaluation.  eval by Terri Piedra 2011: eczema eval by [facility] 2011: bx negative for lichen simplex or derm herpetiformis

## 2011-12-17 NOTE — Assessment & Plan Note (Signed)
Chronic going on 6 + mo.  Not consistent with MSK issue, no pain at actual hips or GTB.  ?truly post herpetic neuralgia.  Will increase gabapentin to 300mg  qid and start tramadol for pain control instead of percocets, hesitant to use strong narcotics chronically for this pain. Room to titrate gabapentin up. Pt agrees with plan.

## 2012-01-08 ENCOUNTER — Ambulatory Visit: Payer: Medicare Other

## 2012-01-09 ENCOUNTER — Other Ambulatory Visit: Payer: Self-pay

## 2012-01-09 ENCOUNTER — Ambulatory Visit: Payer: Medicare Other

## 2012-01-09 NOTE — Telephone Encounter (Signed)
pt request refill betamethasone lotion to [facility]. Spoke with pharmacist at [facility] med is already been refilled and ready for pick up. Pt notified.

## 2012-01-14 ENCOUNTER — Telehealth: Payer: Self-pay

## 2012-01-14 MED ORDER — CETIRIZINE HCL 10 MG PO TABS: 10.0000 mg | ORAL_TABLET | Freq: Every day | ORAL | Status: DC

## 2012-01-14 NOTE — Telephone Encounter (Signed)
Pt having allergy symptoms; runny nose running clear water and sneezing, slight non productive cough,no fever.pt request med to [facility].Please advise.

## 2012-01-14 NOTE — Telephone Encounter (Signed)
Recommend she try otc antihistamine like claritin or zyrtec.  May also take tylenol.  Update [place] if worsening.  i've sent in antihistamine just in case.

## 2012-01-15 NOTE — Telephone Encounter (Signed)
Left message on cell phone to call back

## 2012-01-16 NOTE — Telephone Encounter (Signed)
Message left advising patient.  

## 2012-01-23 ENCOUNTER — Other Ambulatory Visit: Payer: Self-pay | Admitting: Family Medicine

## 2012-01-23 NOTE — Telephone Encounter (Signed)
Pt request refill valisone lotion to [facility]; pt said had refilled but lost med.Please advise.

## 2012-01-24 ENCOUNTER — Other Ambulatory Visit: Payer: Self-pay | Admitting: Family Medicine

## 2012-01-24 NOTE — Telephone Encounter (Signed)
Ok to refill 

## 2012-01-28 ENCOUNTER — Ambulatory Visit: Payer: Medicare Other | Admitting: Family Medicine

## 2012-01-30 ENCOUNTER — Ambulatory Visit: Payer: Medicare Other

## 2012-02-04 ENCOUNTER — Encounter: Payer: Self-pay | Admitting: Family Medicine

## 2012-02-04 ENCOUNTER — Ambulatory Visit (INDEPENDENT_AMBULATORY_CARE_PROVIDER_SITE_OTHER): Payer: Medicare Other | Admitting: Family Medicine

## 2012-02-04 VITALS — BP 134/88 | HR 96 | Temp 97.5°F | Wt 135.0 lb

## 2012-02-04 DIAGNOSIS — B9689 Other specified bacterial agents as the cause of diseases classified elsewhere: Secondary | ICD-10-CM | POA: Insufficient documentation

## 2012-02-04 DIAGNOSIS — B9789 Other viral agents as the cause of diseases classified elsewhere: Secondary | ICD-10-CM

## 2012-02-04 DIAGNOSIS — B349 Viral infection, unspecified: Secondary | ICD-10-CM

## 2012-02-04 NOTE — Patient Instructions (Addendum)
This could have been food poisoning or more likely viral gastroenteritis given upper respiratory symptoms. You do have pharyngitis, likely viral. Take tylenol for throat. Salt water gargles. Suck on cold things like popsicles or warm things like herbal teas (whichever soothes the throat better). Return if fever >101.5, worsening pain, or trouble opening/closing mouth, or hoarse voice. We will provide letter for work. Small sips throughout the day. If any more black stool unrelated from pepto bismol, return for stool kits to check for hidden blood.

## 2012-02-04 NOTE — Progress Notes (Signed)
Subjective:    Patient ID: Maria Fox, female    DOB: 1945/06/04, 66 y.o.   MRN: [id number]  HPI CC: URI sxs  1 d h/o ST, ear pain, nasal itching.  Mild dry cough, +PNDrainage.   No fevers/chills, ear or tooth pain.  No vomiting.    Hasn't tried anything for this. No sick contacts at home.  Had diarrhea for last 6 days, worse on Thursday.  Epigastric abd pain associated with nausea.  Did have black stool at that time, then some green stool.  Some watery stool as well.  This has been slowly resolving.  Wonders if something she ate.  Did take pepto bismol prior to black stools.  Has zofran at home.  Past Medical History  Diagnosis Date  . Hypertension   . Diabetes mellitus   . Multiple allergies     mold, wool, dust, feathers  . Angioedema   . HLD (hyperlipidemia)   . Hx of migraines     remote  . S/P lens implant     left side ([name])  . Vitiligo   . History of chicken pox   . Dermatitis     eval [name] 2011: eczema, eval [name] 2011: bx negative for lichen simplex or derm herpetiformis   Current Outpatient Prescriptions on File Prior to Visit  Medication Sig Dispense Refill  . betamethasone valerate lotion (VALISONE) 0.1 % APPLY ONE APPLICATION TOPICALLY TWICE DAILY  60 mL  0  . cetirizine (ZYRTEC) 10 MG tablet Take 1 tablet (10 mg total) by mouth daily.  30 tablet  1  . EPINEPHrine (EPIPEN) 0.3 mg/0.3 mL DEVI Inject 0.3 mLs (0.3 mg total) into the muscle as needed.  2 Device  0  . gabapentin (NEURONTIN) 300 MG capsule Take 300 mg by mouth 4 (four) times daily.      Marland Kitchen glipiZIDE (GLUCOTROL) 10 MG tablet Take 10 mg by mouth daily.      . hydrOXYzine (ATARAX/VISTARIL) 50 MG tablet Take 1 tablet (50 mg total) by mouth every 6 (six) hours as needed. For itching  30 tablet  11  . metFORMIN (GLUCOPHAGE) 500 MG tablet Take 1 in am and 2 in pm      . metoprolol tartrate (LOPRESSOR) 25 MG tablet Take 12.5 mg by mouth 2 (two) times daily.      . ondansetron (ZOFRAN) 4 MG tablet  Take 4 mg by mouth every 8 (eight) hours as needed. Nausea      . traMADol (ULTRAM) 50 MG tablet TAKE ONE TABLET BY MOUTH EVERY 8 HOURS AS NEEDED FOR PAIN  60 tablet  0   Review of Systems Per HPI    Objective:   Physical Exam  Nursing note and vitals reviewed. Constitutional: She appears well-developed and well-nourished. No distress.  HENT:  Head: Normocephalic and atraumatic.  Mouth/Throat: Oropharynx is clear and moist. No oropharyngeal exudate.  Eyes: Conjunctivae normal and EOM are normal. Pupils are equal, round, and reactive to light. No scleral icterus.  Neck: Normal range of motion. Neck supple.  Cardiovascular: Normal rate, regular rhythm, normal heart sounds and intact distal pulses.   No murmur heard. Pulmonary/Chest: Effort normal and breath sounds normal. No respiratory distress. She has no wheezes. She has no rales.  Abdominal: Soft. Bowel sounds are normal. She exhibits no distension and no mass. There is no hepatosplenomegaly. There is no tenderness. There is no rebound, no guarding and no CVA tenderness.  Musculoskeletal: She exhibits no edema.  Lymphadenopathy:  She has no cervical adenopathy.  Skin: Skin is warm and dry.       Assessment & Plan:

## 2012-02-04 NOTE — Assessment & Plan Note (Signed)
Anticipate initial viral gastro, now viral URTI.  Supportive care discussed as per instructions. Anticipate black stool from pepto bismol. If not improving, return for further eval. If any further black stool off pepto, return for iFOB.

## 2012-02-11 ENCOUNTER — Telehealth: Payer: Self-pay

## 2012-02-11 NOTE — Telephone Encounter (Signed)
pt saw Dr Reece Agar on 02/04/12; pt still has virus, pt no better pt said could hardly speak last night or this morning. Pt said S/T no better and feels achy, eyes watery. And head congested. Pt scheduled appt with Dr Reece Agar 02/12/12 at 12 noon. Advised pt if condition changed or worsened to go to UC if needed.

## 2012-02-11 NOTE — Telephone Encounter (Signed)
Will see

## 2012-02-12 ENCOUNTER — Encounter: Payer: Self-pay | Admitting: *Deleted

## 2012-02-12 ENCOUNTER — Ambulatory Visit (INDEPENDENT_AMBULATORY_CARE_PROVIDER_SITE_OTHER): Payer: Medicare Other | Admitting: Family Medicine

## 2012-02-12 ENCOUNTER — Encounter: Payer: Self-pay | Admitting: Family Medicine

## 2012-02-12 VITALS — BP 150/84 | HR 102 | Temp 98.0°F | Ht 63.0 in | Wt 136.2 lb

## 2012-02-12 DIAGNOSIS — J019 Acute sinusitis, unspecified: Secondary | ICD-10-CM | POA: Diagnosis not present

## 2012-02-12 DIAGNOSIS — B9689 Other specified bacterial agents as the cause of diseases classified elsewhere: Secondary | ICD-10-CM

## 2012-02-12 MED ORDER — DOXYCYCLINE HYCLATE 100 MG PO CAPS: 100.0000 mg | ORAL_CAPSULE | Freq: Two times a day (BID) | ORAL | Status: DC

## 2012-02-12 NOTE — Assessment & Plan Note (Signed)
Given duration and progression of sxs and recent deterioration, anticipate bacterial acute sinusitis. Treat with doxy. See pt instructions for suppoertive care. Pt agrees with plan.

## 2012-02-12 NOTE — Progress Notes (Signed)
  Subjective:    Patient ID: Maria Fox, female    DOB: [birth year], 66 y.o.   MRN: [id number]  HPI CC: continued cold sxs  See prior note for details - seen here 02/04/2012 with 1 d h/o viral URTI sxs.  sxs initially improved, then over last few days.  Very ill Monday and Tuesday.  Continued sinus congestion, hoarse voice, ST, chills, sneezing, dry hacking cough.  Head > chest congestion.  Now 9 days of symptoms.  + mild pressure headache.  + ear pain.  Review of Systems Per HPI    Objective:   Physical Exam  Nursing note and vitals reviewed. Constitutional: She appears well-developed and well-nourished. No distress.  HENT:  Head: Normocephalic and atraumatic.  Right Ear: Hearing, tympanic membrane, external ear and ear canal normal.  Left Ear: Hearing, tympanic membrane, external ear and ear canal normal.  Nose: Mucosal edema present. No rhinorrhea. Right sinus exhibits no maxillary sinus tenderness and no frontal sinus tenderness. Left sinus exhibits no maxillary sinus tenderness and no frontal sinus tenderness.  Mouth/Throat: Uvula is midline and mucous membranes are normal. Posterior oropharyngeal edema and posterior oropharyngeal erythema present. No oropharyngeal exudate or tonsillar abscesses.  Eyes: Conjunctivae normal and EOM are normal. Pupils are equal, round, and reactive to light. No scleral icterus.  Neck: Normal range of motion. Neck supple.  Cardiovascular: Normal rate, regular rhythm, normal heart sounds and intact distal pulses.   No murmur heard. Pulmonary/Chest: Effort normal and breath sounds normal. No respiratory distress. She has no wheezes. She has no rales.  Musculoskeletal: She exhibits no edema.  Lymphadenopathy:    She has no cervical adenopathy.  Skin: Skin is warm and dry. No rash noted.       Assessment & Plan:

## 2012-02-12 NOTE — Patient Instructions (Signed)
You have a bacterial sinus infection. Take medicine as prescribed: doxycycline twice daily for 10 days Push fluids and plenty of rest. Nasal saline irrigation or neti pot to help drain sinuses. May use simple mucinex with plenty of fluid to help mobilize mucous. Let us know if fever >101.5, trouble opening/closing mouth, difficulty swallowing, or worsening - you may need to be seen again.

## 2012-02-27 DIAGNOSIS — L259 Unspecified contact dermatitis, unspecified cause: Secondary | ICD-10-CM | POA: Diagnosis not present

## 2012-03-09 ENCOUNTER — Ambulatory Visit: Payer: Medicare Other

## 2012-03-10 ENCOUNTER — Encounter (HOSPITAL_COMMUNITY): Payer: Self-pay | Admitting: *Deleted

## 2012-03-10 ENCOUNTER — Emergency Department (HOSPITAL_COMMUNITY)
Admission: EM | Admit: 2012-03-10 | Discharge: 2012-03-10 | Disposition: A | Discharge status: Home / Self Care | Payer: Medicare Other | Attending: Emergency Medicine | Admitting: Emergency Medicine

## 2012-03-10 DIAGNOSIS — Z8679 Personal history of other diseases of the circulatory system: Secondary | ICD-10-CM | POA: Insufficient documentation

## 2012-03-10 DIAGNOSIS — E785 Hyperlipidemia, unspecified: Secondary | ICD-10-CM | POA: Diagnosis not present

## 2012-03-10 DIAGNOSIS — Z79899 Other long term (current) drug therapy: Secondary | ICD-10-CM | POA: Insufficient documentation

## 2012-03-10 DIAGNOSIS — Z8619 Personal history of other infectious and parasitic diseases: Secondary | ICD-10-CM | POA: Insufficient documentation

## 2012-03-10 DIAGNOSIS — E119 Type 2 diabetes mellitus without complications: Secondary | ICD-10-CM | POA: Insufficient documentation

## 2012-03-10 DIAGNOSIS — R197 Diarrhea, unspecified: Secondary | ICD-10-CM | POA: Diagnosis not present

## 2012-03-10 DIAGNOSIS — R112 Nausea with vomiting, unspecified: Secondary | ICD-10-CM | POA: Diagnosis not present

## 2012-03-10 DIAGNOSIS — I10 Essential (primary) hypertension: Secondary | ICD-10-CM | POA: Diagnosis not present

## 2012-03-10 DIAGNOSIS — E860 Dehydration: Secondary | ICD-10-CM | POA: Diagnosis not present

## 2012-03-10 LAB — URINE MICROSCOPIC-ADD ON

## 2012-03-10 LAB — COMPREHENSIVE METABOLIC PANEL
Albumin: 3.5 g/dL (ref 3.5–5.2)
BUN: 12 mg/dL (ref 6–23)
Calcium: 8.8 mg/dL (ref 8.4–10.5)
Creatinine, Ser: 0.77 mg/dL (ref 0.50–1.10)
Total Bilirubin: 0.5 mg/dL (ref 0.3–1.2)
Total Protein: 7 g/dL (ref 6.0–8.3)

## 2012-03-10 LAB — CBC WITH DIFFERENTIAL/PLATELET
Basophils Absolute: 0 10*3/uL (ref 0.0–0.1)
Basophils Relative: 0 % (ref 0–1)
Eosinophils Absolute: 0.2 10*3/uL (ref 0.0–0.7)
Eosinophils Relative: 2 % (ref 0–5)
HCT: 40.9 % (ref 36.0–46.0)
Hemoglobin: 14.3 g/dL (ref 12.0–15.0)
MCH: 29.8 pg (ref 26.0–34.0)
MCHC: 35 g/dL (ref 30.0–36.0)
Monocytes Absolute: 0.6 10*3/uL (ref 0.1–1.0)
Monocytes Relative: 6 % (ref 3–12)
RDW: 13.2 % (ref 11.5–15.5)

## 2012-03-10 LAB — URINALYSIS, ROUTINE W REFLEX MICROSCOPIC
Glucose, UA: NEGATIVE mg/dL
Protein, ur: NEGATIVE mg/dL
Specific Gravity, Urine: 1.029 (ref 1.005–1.030)
pH: 5 (ref 5.0–8.0)

## 2012-03-10 MED ORDER — SODIUM CHLORIDE 0.9 % IV BOLUS (SEPSIS)
1000.0000 mL | Freq: Once | INTRAVENOUS | Status: AC
  Administered 2012-03-10: 1000 mL via INTRAVENOUS

## 2012-03-10 MED ORDER — SODIUM CHLORIDE 0.9 % IV BOLUS (SEPSIS): 1000.0000 mL | Freq: Once | INTRAVENOUS | Status: DC

## 2012-03-10 MED ORDER — ONDANSETRON HCL 4 MG/2ML IJ SOLN
4.0000 mg | Freq: Once | INTRAMUSCULAR | Status: AC
  Administered 2012-03-10: 4 mg via INTRAVENOUS
  Filled 2012-03-10: qty 2

## 2012-03-10 MED ORDER — ONDANSETRON HCL 4 MG PO TABS: 4.0000 mg | ORAL_TABLET | Freq: Four times a day (QID) | ORAL | Status: DC

## 2012-03-10 MED ORDER — ACETAMINOPHEN 325 MG PO TABS
ORAL_TABLET | ORAL | Status: AC
  Administered 2012-03-10: 325 mg via ORAL
  Filled 2012-03-10: qty 2

## 2012-03-10 MED ORDER — ACETAMINOPHEN 325 MG PO TABS
650.0000 mg | ORAL_TABLET | Freq: Once | ORAL | Status: AC
  Administered 2012-03-10: 325 mg via ORAL

## 2012-03-10 NOTE — ED Provider Notes (Addendum)
10:06 AM Patient seen and examined. He is requesting further IV fluids. Will reassess in 2 hours  Toy Baker, MD 03/10/12 1007  Toy Baker, MD 03/11/12 1524

## 2012-03-10 NOTE — ED Notes (Signed)
Pt c/o n/v/d x 2 days; feels weak and dehydrated; tachycardic
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
## 2012-03-11 LAB — URINE CULTURE

## 2012-03-15 DIAGNOSIS — R21 Rash and other nonspecific skin eruption: Secondary | ICD-10-CM | POA: Diagnosis not present

## 2012-03-15 DIAGNOSIS — L299 Pruritus, unspecified: Secondary | ICD-10-CM | POA: Diagnosis not present

## 2012-03-16 DIAGNOSIS — L299 Pruritus, unspecified: Secondary | ICD-10-CM | POA: Insufficient documentation

## 2012-03-16 DIAGNOSIS — R21 Rash and other nonspecific skin eruption: Secondary | ICD-10-CM | POA: Insufficient documentation

## 2012-03-17 ENCOUNTER — Ambulatory Visit: Payer: Medicare Other | Admitting: Family Medicine

## 2012-03-23 DIAGNOSIS — L259 Unspecified contact dermatitis, unspecified cause: Secondary | ICD-10-CM | POA: Diagnosis not present

## 2012-04-06 ENCOUNTER — Encounter: Payer: Self-pay | Admitting: Family Medicine

## 2012-04-06 ENCOUNTER — Encounter: Payer: Self-pay | Admitting: *Deleted

## 2012-04-06 ENCOUNTER — Ambulatory Visit (INDEPENDENT_AMBULATORY_CARE_PROVIDER_SITE_OTHER): Payer: Medicare Other | Admitting: Family Medicine

## 2012-04-06 VITALS — BP 154/90 | HR 78 | Temp 97.8°F | Wt 142.5 lb

## 2012-04-06 DIAGNOSIS — L259 Unspecified contact dermatitis, unspecified cause: Secondary | ICD-10-CM | POA: Diagnosis not present

## 2012-04-06 DIAGNOSIS — IMO0002 Reserved for concepts with insufficient information to code with codable children: Secondary | ICD-10-CM

## 2012-04-06 DIAGNOSIS — L309 Dermatitis, unspecified: Secondary | ICD-10-CM

## 2012-04-06 DIAGNOSIS — F4329 Adjustment disorder with other symptoms: Secondary | ICD-10-CM

## 2012-04-06 MED ORDER — TRAZODONE HCL 50 MG PO TABS
25.0000 mg | ORAL_TABLET | Freq: Every evening | ORAL | Status: DC | PRN
Start: 2012-04-06 — End: 2012-04-18

## 2012-04-06 NOTE — Patient Instructions (Signed)
If leg pain is better - back off tramadol and check to see if you're taking gabapentin - if so, may try decreasing to twice daily instead of 3 times daily. May try trazodone for sleep at night time. Out of work for the next 2 weeks. Return in 2 weeks for follow up.

## 2012-04-06 NOTE — Assessment & Plan Note (Signed)
Has 3 days worth of patch testing to be performed first week of February.  Will also need time off from work for this (10:30am).

## 2012-04-06 NOTE — Progress Notes (Signed)
  Subjective:    Patient ID: Maria Fox, female    DOB: February 20, 1946, 67 y.o.   MRN: [id number]  HPI CC: anxiety/trouble sleeping  Trouble handling more stress recently - having issues from recent illnesses and out of work.  Evaluated at end of last year in ER for dehydration after 30 hours of nausea/vomiting/diarrhea.    Working 2 jobs - works part time at [employer] and part time at [employer] and [employer].   Has been in relationship for last 23 years, partner wants to leave.  Will have to find new place to live as boyfriend wants her to move out.   Having trouble sleeping, both falling and staying asleep.   Unable to function at work, breaking down and crying very easily over small things, on edge.  Feels like she's unable to focus at work.  Feels at breaking point.  On atarax and gabapentin, don't help for sleep.  Has seen Dr. Gibson Ramp (Derm) at [facility] for chronic dermatitis of unknown etiology - treating with septra and prednisone.  Tolerating septra despite sulfa allergy.  To have patch testing scheduled first week of February.    Denies h/o depression, anxiety.  Wt Readings from Last 3 Encounters:  04/06/12 142 lb 8 oz (64.638 kg)  03/10/12 135 lb (61.236 kg)  02/12/12 136 lb 4 oz (61.803 kg)    Past Medical History  Diagnosis Date  . Hypertension   . Diabetes mellitus   . Multiple allergies     mold, wool, dust, feathers  . Angioedema   . HLD (hyperlipidemia)   . Hx of migraines     remote  . S/P lens implant     left side ([name])  . Vitiligo   . History of chicken pox   . Dermatitis     eval [name] 2011: eczema, eval [name] 2011: bx negative for lichen simplex or derm herpetiformis    Family History  Problem Relation Age of Onset  . Hypertension Brother   . Hyperlipidemia Brother   . Asthma Brother   . Cancer Father     throat  . Diabetes type II Mother   . Hypertension Mother   . Hyperlipidemia Mother   . Cancer Maternal Grandmother     cervical  .  Coronary artery disease Neg Hx   . Stroke Neg Hx      Review of Systems Per HPI    Objective:   Physical Exam  Nursing note and vitals reviewed. Constitutional: She appears well-developed and well-nourished. No distress.  Psychiatric: She has a normal mood and affect.       Tears with discussion of stressors.       Assessment & Plan:

## 2012-04-06 NOTE — Assessment & Plan Note (Signed)
Pt suffering from adjustment disorder from recent work, health and relationship stressors, feels at breaking point. I do feel it is reasonable to provide brief leave of absence, discussed this. For associated insomnia - will start trazodone to use PRN at night. GAD7 = 21/21

## 2012-04-09 ENCOUNTER — Ambulatory Visit: Payer: Medicare Other | Admitting: Family Medicine

## 2012-04-17 ENCOUNTER — Encounter: Payer: Self-pay | Admitting: Family Medicine

## 2012-04-17 ENCOUNTER — Ambulatory Visit (INDEPENDENT_AMBULATORY_CARE_PROVIDER_SITE_OTHER): Payer: Medicare Other | Admitting: Family Medicine

## 2012-04-17 ENCOUNTER — Encounter: Payer: Self-pay | Admitting: *Deleted

## 2012-04-17 VITALS — BP 180/100 | HR 107 | Temp 97.8°F | Wt 142.8 lb

## 2012-04-17 DIAGNOSIS — IMO0002 Reserved for concepts with insufficient information to code with codable children: Secondary | ICD-10-CM

## 2012-04-17 DIAGNOSIS — J019 Acute sinusitis, unspecified: Secondary | ICD-10-CM

## 2012-04-17 DIAGNOSIS — I10 Essential (primary) hypertension: Secondary | ICD-10-CM

## 2012-04-17 DIAGNOSIS — F4329 Adjustment disorder with other symptoms: Secondary | ICD-10-CM

## 2012-04-17 MED ORDER — DOXYCYCLINE HYCLATE 100 MG PO CAPS: 100.0000 mg | ORAL_CAPSULE | Freq: Two times a day (BID) | ORAL | Status: DC

## 2012-04-17 MED ORDER — AMLODIPINE BESYLATE 2.5 MG PO TABS: 2.5000 mg | ORAL_TABLET | Freq: Every day | ORAL | Status: DC

## 2012-04-17 MED ORDER — METOPROLOL TARTRATE 25 MG PO TABS: 25.0000 mg | ORAL_TABLET | Freq: Two times a day (BID) | ORAL | Status: DC

## 2012-04-17 NOTE — Patient Instructions (Signed)
Your blood pressure is too high! Start amlodipine 2.5mg  daily and increase metoprolol to 25mg  twice daily For congestion, start immediate release guaifenesin or simple mucinex (no decongestants) with water to help mobilize mucous. Take doxycycline twice daily for 10 days for sinus infection/congestion. Return to see me in 2 weeks. Return to work once feeling better (hopefully next week).  Return to work 04/24/2012.

## 2012-04-17 NOTE — Progress Notes (Signed)
  Subjective:    Patient ID: Maria Fox, female    DOB: 1946-02-25, 67 y.o.   MRN: [id number]  HPI CC: sinus congestion, not feeling well  Recently on 2 wk hiatus from work 2/2 adjustment disorder and acutely worsening anxiety.  See prior note for details.  Anxiety has not improved.  Feeling acutely worse now due to below.  Sinus congestion - started 2d ago, sudden worsening today with green discharge.  No fevers.  Feels chilled.  Productive cough of green mucous.  Head > chest congestion.  + ear pain as well as PNDrainage.  Denies abd pain or nausea.  + HA and neck soreness, no stiffness.  HTN - h/o HTN but never this high.  Reports compliance with metoprolol 12.5mg  bid.  Denies vision changes, CP/tightness, SOB, leg swelling, dizziness.  + HA.  Recently treated with prednisone and septra for skin dermatitis.  Finished prednisone 1 week ago.  No smokers at home.  Never smoker.  Past Medical History  Diagnosis Date  . Hypertension   . Diabetes mellitus   . Multiple allergies     mold, wool, dust, feathers  . Angioedema   . HLD (hyperlipidemia)   . Hx of migraines     remote  . S/P lens implant     left side ([name])  . Vitiligo   . History of chicken pox   . Dermatitis     eval [name] 2011: eczema, eval [name] 2011: bx negative for lichen simplex or derm herpetiformis    Review of Systems Per HPI    Objective:   Physical Exam  Nursing note and vitals reviewed. Constitutional: Maria Fox is oriented to person, place, and time. Maria Fox appears well-developed and well-nourished. No distress.  HENT:  Head: Normocephalic and atraumatic.  Right Ear: Hearing, tympanic membrane, external ear and ear canal normal.  Left Ear: Hearing, tympanic membrane, external ear and ear canal normal.  Nose: Nose normal. No mucosal edema or rhinorrhea. Right sinus exhibits no maxillary sinus tenderness and no frontal sinus tenderness. Left sinus exhibits no maxillary sinus tenderness and no frontal sinus  tenderness.  Mouth/Throat: Uvula is midline, oropharynx is clear and moist and mucous membranes are normal. No oropharyngeal exudate, posterior oropharyngeal edema, posterior oropharyngeal erythema or tonsillar abscesses.       Evidently congested  Eyes: Conjunctivae normal and EOM are normal. Pupils are equal, round, and reactive to light. No scleral icterus.  Neck: Normal range of motion. Neck supple. Carotid bruit is not present.  Cardiovascular: Normal rate, regular rhythm, normal heart sounds and intact distal pulses.   No murmur heard. Pulmonary/Chest: Effort normal and breath sounds normal. No respiratory distress. Maria Fox has no wheezes. Maria Fox has no rales.  Lymphadenopathy:    Maria Fox has no cervical adenopathy.  Neurological: Maria Fox is alert and oriented to person, place, and time.       CN 2-12 intact Station and gait intact Normal FTN, neg pronator drift  Skin: Skin is warm and dry. No rash noted.       Assessment & Plan:

## 2012-04-18 ENCOUNTER — Encounter (HOSPITAL_COMMUNITY): Payer: Self-pay | Admitting: Emergency Medicine

## 2012-04-18 ENCOUNTER — Emergency Department (HOSPITAL_COMMUNITY): Payer: Medicare Other

## 2012-04-18 ENCOUNTER — Emergency Department (HOSPITAL_COMMUNITY)
Admission: EM | Admit: 2012-04-18 | Discharge: 2012-04-19 | Disposition: A | Discharge status: Home / Self Care | Payer: Medicare Other | Attending: Emergency Medicine | Admitting: Emergency Medicine

## 2012-04-18 DIAGNOSIS — R197 Diarrhea, unspecified: Secondary | ICD-10-CM | POA: Insufficient documentation

## 2012-04-18 DIAGNOSIS — R112 Nausea with vomiting, unspecified: Secondary | ICD-10-CM | POA: Diagnosis not present

## 2012-04-18 DIAGNOSIS — Z79899 Other long term (current) drug therapy: Secondary | ICD-10-CM | POA: Diagnosis not present

## 2012-04-18 DIAGNOSIS — E119 Type 2 diabetes mellitus without complications: Secondary | ICD-10-CM | POA: Insufficient documentation

## 2012-04-18 DIAGNOSIS — Z8619 Personal history of other infectious and parasitic diseases: Secondary | ICD-10-CM | POA: Insufficient documentation

## 2012-04-18 DIAGNOSIS — R059 Cough, unspecified: Secondary | ICD-10-CM | POA: Insufficient documentation

## 2012-04-18 DIAGNOSIS — IMO0001 Reserved for inherently not codable concepts without codable children: Secondary | ICD-10-CM | POA: Insufficient documentation

## 2012-04-18 DIAGNOSIS — I10 Essential (primary) hypertension: Secondary | ICD-10-CM | POA: Insufficient documentation

## 2012-04-18 DIAGNOSIS — Z9089 Acquired absence of other organs: Secondary | ICD-10-CM | POA: Diagnosis not present

## 2012-04-18 DIAGNOSIS — J111 Influenza due to unidentified influenza virus with other respiratory manifestations: Secondary | ICD-10-CM

## 2012-04-18 DIAGNOSIS — Z8679 Personal history of other diseases of the circulatory system: Secondary | ICD-10-CM | POA: Insufficient documentation

## 2012-04-18 DIAGNOSIS — Z862 Personal history of diseases of the blood and blood-forming organs and certain disorders involving the immune mechanism: Secondary | ICD-10-CM | POA: Diagnosis not present

## 2012-04-18 DIAGNOSIS — Z872 Personal history of diseases of the skin and subcutaneous tissue: Secondary | ICD-10-CM | POA: Insufficient documentation

## 2012-04-18 DIAGNOSIS — R42 Dizziness and giddiness: Secondary | ICD-10-CM | POA: Diagnosis not present

## 2012-04-18 DIAGNOSIS — J438 Other emphysema: Secondary | ICD-10-CM | POA: Diagnosis not present

## 2012-04-18 DIAGNOSIS — J029 Acute pharyngitis, unspecified: Secondary | ICD-10-CM | POA: Diagnosis not present

## 2012-04-18 DIAGNOSIS — Z8639 Personal history of other endocrine, nutritional and metabolic disease: Secondary | ICD-10-CM | POA: Insufficient documentation

## 2012-04-18 DIAGNOSIS — R51 Headache: Secondary | ICD-10-CM | POA: Insufficient documentation

## 2012-04-18 DIAGNOSIS — R05 Cough: Secondary | ICD-10-CM | POA: Insufficient documentation

## 2012-04-18 DIAGNOSIS — R0602 Shortness of breath: Secondary | ICD-10-CM | POA: Insufficient documentation

## 2012-04-18 DIAGNOSIS — L80 Vitiligo: Secondary | ICD-10-CM | POA: Insufficient documentation

## 2012-04-18 DIAGNOSIS — R011 Cardiac murmur, unspecified: Secondary | ICD-10-CM | POA: Insufficient documentation

## 2012-04-18 LAB — COMPREHENSIVE METABOLIC PANEL
ALT: 18 U/L (ref 0–35)
AST: 17 U/L (ref 0–37)
Albumin: 3.4 g/dL — ABNORMAL LOW (ref 3.5–5.2)
Alkaline Phosphatase: 82 U/L (ref 39–117)
Chloride: 100 mEq/L (ref 96–112)
Potassium: 4.1 mEq/L (ref 3.5–5.1)
Sodium: 134 mEq/L — ABNORMAL LOW (ref 135–145)
Total Protein: 6.8 g/dL (ref 6.0–8.3)

## 2012-04-18 LAB — CBC WITH DIFFERENTIAL/PLATELET
Basophils Absolute: 0 10*3/uL (ref 0.0–0.1)
Basophils Relative: 1 % (ref 0–1)
Eosinophils Absolute: 0.3 10*3/uL (ref 0.0–0.7)
MCH: 30.1 pg (ref 26.0–34.0)
MCHC: 34.5 g/dL (ref 30.0–36.0)
Neutro Abs: 4.9 10*3/uL (ref 1.7–7.7)
Neutrophils Relative %: 69 % (ref 43–77)
Platelets: 193 10*3/uL (ref 150–400)

## 2012-04-18 MED ORDER — ONDANSETRON HCL 4 MG/2ML IJ SOLN
4.0000 mg | Freq: Once | INTRAMUSCULAR | Status: AC
  Administered 2012-04-18: 4 mg via INTRAVENOUS
  Filled 2012-04-18: qty 2

## 2012-04-18 MED ORDER — METOCLOPRAMIDE HCL 10 MG PO TABS: 10.0000 mg | ORAL_TABLET | Freq: Four times a day (QID) | ORAL | Status: DC

## 2012-04-18 MED ORDER — SODIUM CHLORIDE 0.9 % IV BOLUS (SEPSIS)
1000.0000 mL | Freq: Once | INTRAVENOUS | Status: AC
  Administered 2012-04-18: 1000 mL via INTRAVENOUS

## 2012-04-18 MED ORDER — KETOROLAC TROMETHAMINE 30 MG/ML IJ SOLN
30.0000 mg | Freq: Once | INTRAMUSCULAR | Status: AC
  Administered 2012-04-18: 30 mg via INTRAVENOUS
  Filled 2012-04-18: qty 1
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
## 2012-04-18 NOTE — ED Notes (Signed)
Pt c/o flulike symptoms since Wednesday,  Pt saw her pcp yesterday and was dc'd with antibiotics.  Pt reports "feeling worse with more nausea and I am unable to eat."

## 2012-04-18 NOTE — ED Notes (Signed)
Pt c/o body aches, cough, sore throat, dizziness, chills, headache, vomiting, onset Wednesday. Pt seen by PCP yesterday.

## 2012-04-19 DIAGNOSIS — J019 Acute sinusitis, unspecified: Secondary | ICD-10-CM | POA: Insufficient documentation

## 2012-04-19 NOTE — Assessment & Plan Note (Signed)
Continued adjustment disorder and anxiety - desired to stay out of work for another 2 weeks - discussed given initial hiatus did not help anxiety sxs I do recommend returning to work as soon as possible.  See below.

## 2012-04-19 NOTE — ED Notes (Signed)
Pt verbalizes understanding 

## 2012-04-19 NOTE — Assessment & Plan Note (Signed)
With hypertensive urgency today.  nonfocal neurological exam.  Does have HA today.  Will increase metoprolol to 25mg  bid as well as start amlodipine at 5mg  daily.  Pt agrees with plan.  RTC 2 weeks for f/u.

## 2012-04-19 NOTE — Assessment & Plan Note (Signed)
Early in course, however given severity of sxs as well as recent steroid use and antibiotics for dermatitis, at risk for recurrent bacterial infection. Hence will treat aggressively as bacterial sinusitis with doxycycline course. Doubt influenza as no fever.  Did receive flu shot this year. Will prolong work hiatus by 1 week, but did suggest she return to work next week. If [facility] needs her to take time from work for further skin testing, they can provide work note.

## 2012-04-20 ENCOUNTER — Ambulatory Visit: Payer: Medicare Other | Admitting: Family Medicine

## 2012-04-20 DIAGNOSIS — L259 Unspecified contact dermatitis, unspecified cause: Secondary | ICD-10-CM | POA: Diagnosis not present

## 2012-04-22 DIAGNOSIS — Z79899 Other long term (current) drug therapy: Secondary | ICD-10-CM | POA: Diagnosis not present

## 2012-04-22 DIAGNOSIS — L259 Unspecified contact dermatitis, unspecified cause: Secondary | ICD-10-CM | POA: Diagnosis not present

## 2012-04-22 DIAGNOSIS — E669 Obesity, unspecified: Secondary | ICD-10-CM | POA: Diagnosis not present

## 2012-04-22 DIAGNOSIS — I10 Essential (primary) hypertension: Secondary | ICD-10-CM | POA: Diagnosis not present

## 2012-04-22 DIAGNOSIS — G894 Chronic pain syndrome: Secondary | ICD-10-CM | POA: Diagnosis not present

## 2012-04-22 DIAGNOSIS — M533 Sacrococcygeal disorders, not elsewhere classified: Secondary | ICD-10-CM | POA: Diagnosis not present

## 2012-04-24 DIAGNOSIS — L259 Unspecified contact dermatitis, unspecified cause: Secondary | ICD-10-CM | POA: Diagnosis not present

## 2012-04-30 ENCOUNTER — Ambulatory Visit: Payer: Medicare Other | Admitting: Family Medicine

## 2012-05-01 ENCOUNTER — Encounter: Payer: Self-pay | Admitting: *Deleted

## 2012-05-01 ENCOUNTER — Ambulatory Visit (INDEPENDENT_AMBULATORY_CARE_PROVIDER_SITE_OTHER): Payer: Medicare Other | Admitting: Family Medicine

## 2012-05-01 ENCOUNTER — Encounter: Payer: Self-pay | Admitting: Family Medicine

## 2012-05-01 VITALS — BP 150/90 | HR 100 | Temp 97.7°F | Wt 144.5 lb

## 2012-05-01 DIAGNOSIS — I10 Essential (primary) hypertension: Secondary | ICD-10-CM | POA: Diagnosis not present

## 2012-05-01 DIAGNOSIS — L259 Unspecified contact dermatitis, unspecified cause: Secondary | ICD-10-CM | POA: Diagnosis not present

## 2012-05-01 DIAGNOSIS — J019 Acute sinusitis, unspecified: Secondary | ICD-10-CM

## 2012-05-01 DIAGNOSIS — F4329 Adjustment disorder with other symptoms: Secondary | ICD-10-CM

## 2012-05-01 DIAGNOSIS — IMO0002 Reserved for concepts with insufficient information to code with codable children: Secondary | ICD-10-CM | POA: Diagnosis not present

## 2012-05-01 DIAGNOSIS — L309 Dermatitis, unspecified: Secondary | ICD-10-CM

## 2012-05-01 MED ORDER — METOPROLOL TARTRATE 50 MG PO TABS: 50.0000 mg | ORAL_TABLET | Freq: Two times a day (BID) | ORAL | Status: DC

## 2012-05-01 MED ORDER — ACYCLOVIR 5 % EX OINT: TOPICAL_OINTMENT | Freq: Every day | CUTANEOUS | Status: DC

## 2012-05-01 NOTE — Assessment & Plan Note (Addendum)
Resolving sxs. Reviewed ER records.

## 2012-05-01 NOTE — Assessment & Plan Note (Signed)
continue to follow with derm.

## 2012-05-01 NOTE — Patient Instructions (Addendum)
Continue amlodipine 2.5mg  for blood pressure. Increase metoprolol to 2 pills twice daily of 25mg  dose until you run out.  New dose will be 50mg  twice daily (sent in to pharmacy). Use vanicream or cerav cream Return in 2 months for recheck blood pressure. Keep track of blood pressure and heart rate at home and bring to next appointment.

## 2012-05-01 NOTE — Assessment & Plan Note (Signed)
i've asked her to return to work.

## 2012-05-01 NOTE — Progress Notes (Signed)
  Subjective:    Patient ID: Maria Fox, female    DOB: 22-Jun-1945, 67 y.o.   MRN: [id number]  HPI CC: f/u HTN  Sinus congestion slowly improving.  After seen here, seen at ER with SOB, dx with influenza like illness, told CXR had emphysematous changes although never smoker.  Records reviewed.  HTN - better control with addition of amlodipine along with metoprolol 25mg  bid.  No HA, vision changes, CP/tightness, SOB, leg swelling.  Had fever blisters with this- zovirax helps.  Seeing [employer] - completed patch testing.  Told has allergy to dye in clothes.  Not sure she buys it.  Thinks she's reacting to something in her body.  Now off prednisone.  Using vanicream given by [facility].  Past Medical History  Diagnosis Date  . Hypertension   . Diabetes mellitus   . Multiple allergies     mold, wool, dust, feathers  . Angioedema   . HLD (hyperlipidemia)   . Hx of migraines     remote  . S/P lens implant     left side ([name])  . Vitiligo   . History of chicken pox   . Dermatitis     eval [name] 2011: eczema, eval [name] 2011: bx negative for lichen simplex or derm herpetiformis     Review of Systems Per HPI    Objective:   Physical Exam  Nursing note and vitals reviewed. Constitutional: She appears well-developed and well-nourished. No distress.  HENT:  Head: Normocephalic and atraumatic.  Mouth/Throat: Oropharynx is clear and moist. No oropharyngeal exudate.  Neck: Normal range of motion. Neck supple. Carotid bruit is not present.  Cardiovascular: Normal rate, regular rhythm, normal heart sounds and intact distal pulses.   No murmur heard. Pulmonary/Chest: Effort normal and breath sounds normal. No respiratory distress. She has no wheezes. She has no rales.  Lymphadenopathy:    She has no cervical adenopathy.  Skin: Skin is warm and dry. Rash noted.  Persistent skin rash on legs, improved on arms but starting to break out again, vitiligo.       Assessment & Plan:

## 2012-05-01 NOTE — Assessment & Plan Note (Signed)
Slowly improving blood pressure but remaining elevated - recommended increase metoprolol to 50mg  bid. rtc 2 mo for f/u. I also have asked her to keep track of blood pressures at home and bring log to next office visit.

## 2012-05-04 DIAGNOSIS — L259 Unspecified contact dermatitis, unspecified cause: Secondary | ICD-10-CM | POA: Diagnosis not present

## 2012-05-25 ENCOUNTER — Telehealth: Payer: Self-pay

## 2012-05-25 MED ORDER — FLUCONAZOLE 150 MG PO TABS: 150.0000 mg | ORAL_TABLET | Freq: Once | ORAL | Status: DC

## 2012-05-25 NOTE — Telephone Encounter (Signed)
Pt said 3-4 weeks ago took antibiotic and beginning 05/18/12 started with ? Yeast infection; vaginal discharge with slight vaginal itching, more itching and redness on perineal area. Pt said due to weather doubts can come for appt tomorrow. Pt request med sent to [facility].Please advise. Pt request call back with answer.

## 2012-05-25 NOTE — Telephone Encounter (Signed)
Sent in diflucan, left message on answering machine.

## 2012-05-29 ENCOUNTER — Telehealth: Payer: Self-pay | Admitting: Family Medicine

## 2012-05-29 MED ORDER — FLUCONAZOLE 150 MG PO TABS: 150.0000 mg | ORAL_TABLET | Freq: Once | ORAL | Status: DC

## 2012-05-29 NOTE — Telephone Encounter (Signed)
Patient was given a pill for her yeast infection.  It is almost cleared up but still there.  She needs another yeast infection pill called in.  Please call patient.

## 2012-05-29 NOTE — Telephone Encounter (Signed)
Patient notified

## 2012-05-29 NOTE — Telephone Encounter (Signed)
plz notify sent in. 

## 2012-06-02 ENCOUNTER — Encounter (HOSPITAL_COMMUNITY): Payer: Self-pay | Admitting: *Deleted

## 2012-06-02 ENCOUNTER — Observation Stay (HOSPITAL_COMMUNITY)
Admission: EM | Admit: 2012-06-02 | Discharge: 2012-06-03 | Disposition: A | Discharge status: Home / Self Care | Payer: Medicare Other | Attending: Internal Medicine | Admitting: Internal Medicine

## 2012-06-02 DIAGNOSIS — E785 Hyperlipidemia, unspecified: Secondary | ICD-10-CM | POA: Diagnosis not present

## 2012-06-02 DIAGNOSIS — L259 Unspecified contact dermatitis, unspecified cause: Secondary | ICD-10-CM | POA: Insufficient documentation

## 2012-06-02 DIAGNOSIS — I10 Essential (primary) hypertension: Secondary | ICD-10-CM | POA: Diagnosis not present

## 2012-06-02 DIAGNOSIS — L80 Vitiligo: Secondary | ICD-10-CM | POA: Diagnosis not present

## 2012-06-02 DIAGNOSIS — Z79899 Other long term (current) drug therapy: Secondary | ICD-10-CM | POA: Insufficient documentation

## 2012-06-02 DIAGNOSIS — L309 Dermatitis, unspecified: Secondary | ICD-10-CM | POA: Diagnosis present

## 2012-06-02 DIAGNOSIS — T783XXS Angioneurotic edema, sequela: Secondary | ICD-10-CM

## 2012-06-02 DIAGNOSIS — T783XXA Angioneurotic edema, initial encounter: Secondary | ICD-10-CM | POA: Diagnosis not present

## 2012-06-02 DIAGNOSIS — E119 Type 2 diabetes mellitus without complications: Secondary | ICD-10-CM | POA: Diagnosis not present

## 2012-06-02 DIAGNOSIS — E1165 Type 2 diabetes mellitus with hyperglycemia: Secondary | ICD-10-CM | POA: Diagnosis present

## 2012-06-02 DIAGNOSIS — E1149 Type 2 diabetes mellitus with other diabetic neurological complication: Secondary | ICD-10-CM

## 2012-06-02 DIAGNOSIS — IMO0002 Reserved for concepts with insufficient information to code with codable children: Secondary | ICD-10-CM

## 2012-06-02 DIAGNOSIS — X58XXXA Exposure to other specified factors, initial encounter: Secondary | ICD-10-CM | POA: Insufficient documentation

## 2012-06-02 DIAGNOSIS — IMO0001 Reserved for inherently not codable concepts without codable children: Secondary | ICD-10-CM | POA: Diagnosis not present

## 2012-06-02 HISTORY — DX: Type 2 diabetes mellitus with other diabetic neurological complication: E11.49

## 2012-06-02 HISTORY — DX: Reserved for concepts with insufficient information to code with codable children: IMO0002

## 2012-06-02 LAB — BASIC METABOLIC PANEL
BUN: 12 mg/dL (ref 6–23)
Calcium: 9 mg/dL (ref 8.4–10.5)
GFR calc Af Amer: >90 mL/min (ref >90)
GFR calc non Af Amer: 87 mL/min — ABNORMAL LOW (ref >90)
Potassium: 3.9 mEq/L (ref 3.5–5.1)
Sodium: 136 mEq/L (ref 135–145)

## 2012-06-02 LAB — CBC
HCT: 37.4 % (ref 36.0–46.0)
Hemoglobin: 13.1 g/dL (ref 12.0–15.0)
MCH: 29.8 pg (ref 26.0–34.0)
MCHC: 35.1 g/dL (ref 30.0–36.0)
MCV: 87.2 fL (ref 78.0–100.0)
Platelets: 216 10*3/uL (ref 150–400)
RBC: 4.32 MIL/uL (ref 3.87–5.11)
RDW: 13 % (ref 11.5–15.5)
WBC: 6.4 10*3/uL (ref 4.0–10.5)

## 2012-06-02 LAB — GLUCOSE, CAPILLARY
Glucose-Capillary: 412 mg/dL — ABNORMAL HIGH (ref 70–99)
Glucose-Capillary: 331 mg/dL — ABNORMAL HIGH (ref 70–99)
Glucose-Capillary: 271 mg/dL — ABNORMAL HIGH (ref 70–99)

## 2012-06-02 LAB — POCT I-STAT, CHEM 8
Chloride: 106 mEq/L (ref 96–112)
Creatinine, Ser: 0.9 mg/dL (ref 0.50–1.10)
Glucose, Bld: 384 mg/dL — ABNORMAL HIGH (ref 70–99)
HCT: 38 % (ref 36.0–46.0)
Hemoglobin: 12.9 g/dL (ref 12.0–15.0)
Potassium: 4 mEq/L (ref 3.5–5.1)
Sodium: 136 mEq/L (ref 135–145)

## 2012-06-02 LAB — HEMOGLOBIN A1C: Hgb A1c MFr Bld: 11.7 % — ABNORMAL HIGH (ref <5.7)

## 2012-06-02 MED ORDER — AMLODIPINE BESYLATE 2.5 MG PO TABS
2.5000 mg | ORAL_TABLET | Freq: Every morning | ORAL | Status: DC
  Administered 2012-06-02 – 2012-06-03 (×2): 2.5 mg via ORAL
  Filled 2012-06-02 (×2): qty 1

## 2012-06-02 MED ORDER — ENOXAPARIN SODIUM 40 MG/0.4ML ~~LOC~~ SOLN
40.0000 mg | SUBCUTANEOUS | Status: DC
  Administered 2012-06-02 – 2012-06-03 (×2): 40 mg via SUBCUTANEOUS
  Filled 2012-06-02 (×2): qty 0.4

## 2012-06-02 MED ORDER — INSULIN ASPART 100 UNIT/ML ~~LOC~~ SOLN
4.0000 [IU] | Freq: Three times a day (TID) | SUBCUTANEOUS | Status: DC
  Administered 2012-06-02 – 2012-06-03 (×4): 4 [IU] via SUBCUTANEOUS

## 2012-06-02 MED ORDER — METHYLPREDNISOLONE SODIUM SUCC 40 MG IJ SOLR
40.0000 mg | Freq: Every day | INTRAMUSCULAR | Status: DC
  Administered 2012-06-02: 40 mg via INTRAVENOUS
  Filled 2012-06-02: qty 1

## 2012-06-02 MED ORDER — ACETAMINOPHEN 650 MG RE SUPP: 650.0000 mg | Freq: Four times a day (QID) | RECTAL | Status: DC | PRN

## 2012-06-02 MED ORDER — SODIUM CHLORIDE 0.9 % IJ SOLN
3.0000 mL | Freq: Two times a day (BID) | INTRAMUSCULAR | Status: DC
  Administered 2012-06-02: 3 mL via INTRAVENOUS

## 2012-06-02 MED ORDER — SODIUM CHLORIDE 0.9 % IV SOLN
INTRAVENOUS | Status: AC
  Administered 2012-06-02 (×3): via INTRAVENOUS

## 2012-06-02 MED ORDER — DIPHENHYDRAMINE HCL 50 MG/ML IJ SOLN: 25.0000 mg | Freq: Four times a day (QID) | INTRAMUSCULAR | Status: DC | PRN

## 2012-06-02 MED ORDER — SODIUM CHLORIDE 0.9 % IV SOLN
INTRAVENOUS | Status: DC
  Administered 2012-06-02: 03:00:00 via INTRAVENOUS

## 2012-06-02 MED ORDER — FAMOTIDINE IN NACL 20-0.9 MG/50ML-% IV SOLN
20.0000 mg | Freq: Two times a day (BID) | INTRAVENOUS | Status: DC
  Administered 2012-06-02 (×2): 20 mg via INTRAVENOUS
  Filled 2012-06-02 (×5): qty 50

## 2012-06-02 MED ORDER — GLIPIZIDE 10 MG PO TABS
10.0000 mg | ORAL_TABLET | Freq: Every day | ORAL | Status: DC
  Administered 2012-06-02: 10 mg via ORAL
  Filled 2012-06-02 (×2): qty 1

## 2012-06-02 MED ORDER — TRAZODONE 25 MG HALF TABLET
25.0000 mg | ORAL_TABLET | Freq: Every evening | ORAL | Status: DC | PRN
  Administered 2012-06-02: 50 mg via ORAL
  Filled 2012-06-02: qty 2

## 2012-06-02 MED ORDER — ACETAMINOPHEN 325 MG PO TABS
650.0000 mg | ORAL_TABLET | Freq: Four times a day (QID) | ORAL | Status: DC | PRN
  Administered 2012-06-02: 650 mg via ORAL
  Filled 2012-06-02: qty 2

## 2012-06-02 MED ORDER — FAMOTIDINE IN NACL 20-0.9 MG/50ML-% IV SOLN
20.0000 mg | Freq: Once | INTRAVENOUS | Status: AC
  Administered 2012-06-02: 20 mg via INTRAVENOUS
  Filled 2012-06-02: qty 50

## 2012-06-02 MED ORDER — VITAMINS A & D EX OINT
TOPICAL_OINTMENT | CUTANEOUS | Status: AC
  Administered 2012-06-02: 09:00:00
  Filled 2012-06-02: qty 5

## 2012-06-02 MED ORDER — INSULIN ASPART 100 UNIT/ML ~~LOC~~ SOLN
0.0000 [IU] | Freq: Three times a day (TID) | SUBCUTANEOUS | Status: DC
  Administered 2012-06-02: 15 [IU] via SUBCUTANEOUS
  Administered 2012-06-02: 18:00:00 via SUBCUTANEOUS
  Administered 2012-06-02: 22 [IU] via SUBCUTANEOUS
  Administered 2012-06-03: 15 [IU] via SUBCUTANEOUS
  Administered 2012-06-03: 7 [IU] via SUBCUTANEOUS

## 2012-06-02 MED ORDER — ONDANSETRON HCL 4 MG PO TABS: 4.0000 mg | ORAL_TABLET | Freq: Four times a day (QID) | ORAL | Status: DC | PRN

## 2012-06-02 MED ORDER — EPINEPHRINE 0.15 MG/0.3ML IJ DEVI
0.1500 mg | Freq: Once | INTRAMUSCULAR | Status: AC
  Administered 2012-06-02: 0.15 mg via INTRAMUSCULAR
  Filled 2012-06-02: qty 0.3

## 2012-06-02 MED ORDER — METHYLPREDNISOLONE SODIUM SUCC 40 MG IJ SOLR
40.0000 mg | Freq: Two times a day (BID) | INTRAMUSCULAR | Status: DC
  Administered 2012-06-02 – 2012-06-03 (×2): 40 mg via INTRAVENOUS
  Filled 2012-06-02 (×4): qty 1

## 2012-06-02 MED ORDER — METOPROLOL TARTRATE 50 MG PO TABS
50.0000 mg | ORAL_TABLET | Freq: Two times a day (BID) | ORAL | Status: DC
  Administered 2012-06-02 – 2012-06-03 (×3): 50 mg via ORAL
  Filled 2012-06-02 (×4): qty 1

## 2012-06-02 MED ORDER — METOCLOPRAMIDE HCL 10 MG PO TABS
10.0000 mg | ORAL_TABLET | Freq: Four times a day (QID) | ORAL | Status: DC
  Administered 2012-06-02 – 2012-06-03 (×6): 10 mg via ORAL
  Filled 2012-06-02 (×9): qty 1

## 2012-06-02 MED ORDER — TRAMADOL HCL 50 MG PO TABS: 50.0000 mg | ORAL_TABLET | Freq: Three times a day (TID) | ORAL | Status: DC | PRN

## 2012-06-02 MED ORDER — INSULIN ASPART 100 UNIT/ML ~~LOC~~ SOLN: 0.0000 [IU] | Freq: Three times a day (TID) | SUBCUTANEOUS | Status: DC

## 2012-06-02 MED ORDER — TRIAMCINOLONE ACETONIDE 0.1 % EX OINT
1.0000 "application " | TOPICAL_OINTMENT | Freq: Two times a day (BID) | CUTANEOUS | Status: DC
  Administered 2012-06-02 – 2012-06-03 (×3): 1 via TOPICAL
  Filled 2012-06-02: qty 15

## 2012-06-02 MED ORDER — DIPHENHYDRAMINE HCL 50 MG/ML IJ SOLN
25.0000 mg | Freq: Once | INTRAMUSCULAR | Status: AC
  Administered 2012-06-02: 25 mg via INTRAVENOUS
  Filled 2012-06-02: qty 1

## 2012-06-02 MED ORDER — METHYLPREDNISOLONE SODIUM SUCC 125 MG IJ SOLR
125.0000 mg | Freq: Once | INTRAMUSCULAR | Status: AC
  Administered 2012-06-02: 125 mg via INTRAVENOUS
  Filled 2012-06-02: qty 2

## 2012-06-02 MED ORDER — INSULIN ASPART 100 UNIT/ML ~~LOC~~ SOLN
0.0000 [IU] | Freq: Every day | SUBCUTANEOUS | Status: DC
  Administered 2012-06-02: 3 [IU] via SUBCUTANEOUS

## 2012-06-02 MED ORDER — ONDANSETRON HCL 4 MG/2ML IJ SOLN: 4.0000 mg | Freq: Four times a day (QID) | INTRAMUSCULAR | Status: DC | PRN

## 2012-06-02 NOTE — Progress Notes (Signed)
Pt had 13 beats of Vtach. MD notified.

## 2012-06-02 NOTE — Progress Notes (Addendum)
Inpatient Diabetes Program Recommendations  AACE/ADA: New Consensus Statement on Inpatient Glycemic Control (2013)  Target Ranges:  Prepandial:   less than 140 mg/dL      Peak postprandial:   less than 180 mg/dL (1-2 hours)      Critically ill patients:  140 - 180 mg/dL   Reason for Visit: Results for Maria Fox, Maria Fox (MRN [id number]) as of 06/02/2012 14:33  Ref. Range 06/02/2012 07:38 06/02/2012 12:04  Glucose-Capillary Latest Range: 70-99 mg/dL 409 (H) 811 (H)  Results for Maria Fox, Maria Fox (MRN [id number]) as of 06/02/2012 14:33  Ref. Range 06/02/2012 01:05  Hemoglobin A1C Latest Range: <5.7 % 11.7 (H)   Note CBG's elevated with IV steroids.  However A1C is also much greater than goal.  Consider adding Lantus 12 units daily(0.2 units/kg).  It appears patient would benefit from insulin therapy at home also.  Will talk with patient regarding home diabetes management.    Note: Talked to patient regarding her home glycemic control.  She states that when she checks her CBG's in the morning, it is usually 120-130 mg/dL.  She does not check after meals.  She states that she was recently on Prednisone for "dermatitis" that is being treated by her dermatologist in the past 2 weeks.  This likely increased CBG's and possibly A1C.  She is interested in attending classes regarding diabetes.  Also instructed her to check CBG's at least tid when on steroids and call PCP if greater than 200 mg/dL.  Will follow.  If patient is to be discharged home on Prednisone will likely need insulin at discharge.
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
## 2012-06-02 NOTE — ED Notes (Signed)
Pt states has a hx of allergic reaction 11/2010, states was admitted d/t swelling of lips/throat and tongue, pt states this morning started having swelling of bottom lip and has worsened over time, pt also states back of tongue feels like it's swollen, bottom lip obviously swollen, pt able to talk, in no respiratory distress at this time.
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
## 2012-06-02 NOTE — Progress Notes (Signed)
[facility] PROGRESS NOTE  ABIHA LUKEHART WGN:[id number] DOB: 09-04-1945 DOA: 06/02/2012 PCP: Eustaquio Boyden, MD  Assessment/Plan: Angioedema -This is the patient's fourth episode since September 2012 -No previous workup, has not seen an allergist/immunologist -Suspect underlying autoimmune/immune etiology especially in the face of history of vitiligo -Suspect underlying acquired C1 INH deficiency -Will need outpatient followup with allergist/immunologist -No new medications including over-the-counter NSAIDs -Check hepatic panel to rule out globulin gap -Continue Solu-Medrol, Pepcid--> improving Diabetes mellitus type 2, uncontrolled -Partly due to the patient's steroids -Hemoglobin A1c 9.4 on 10/21/2011 -May need to send home on insulin -Recheck A1c -On glipizide and metformin only at home -Add NovoLog pre-meal -Continue NovoLog sliding scale -Check lipids, TSH Hypertension -Continue home amlodipine and metoprolol tartrate     Family Communication:   Pt at beside (total ) Disposition Plan:   Home when medically stable      Procedures/Studies:  No results found.      Subjective: Patient states that, and lips are feeling much better and swelling has improved significantly. Denies dysphasia or dysphagia.denies fevers, chills, chest pain, shortness of breath, nausea, vomiting, diarrhea, abdominal pain   Objective: Filed Vitals:   06/02/12 0051 06/02/12 0320 06/02/12 0340  BP: 157/89 160/70   Pulse: 99 106   Temp: 97.4 F (36.3 C) 98.9 F (37.2 C)   TempSrc: Oral Oral   Resp: 16 18   Height:   5\' 3"  (1.6 m)  Weight:   63.3 kg (139 lb 8.8 oz)  SpO2: 99% 97%     Intake/Output Summary (Last 24 hours) at 06/02/12 1112 Last data filed at 06/02/12 1011  Gross per 24 hour  Intake   1245 ml  Output    950 ml  Net    295 ml   Weight change:  Exam:   General:  Pt is alert, follows commands appropriately, not in acute distress  HEENT: No icterus,  No thrush, No neck mass, [place]/AT; mild lip edema, no tongue swelling ; no pharyngeal exudates   Cardiovascular: RRR, S1/S2, no rubs, no gallops  Respiratory: CTA bilaterally, no wheezing, no crackles, no rhonchi  Abdomen: Soft/+BS, non tender, non distended, no guarding  Extremities: No edema, No lymphangitis, No petechiae, No rashes, no synovitis  Data Reviewed: Basic Metabolic Panel:  Recent Labs Lab 06/02/12 0115 06/02/12 0430  NA 136 136  K 4.0 3.9  CL 106 102  CO2  --  22  GLUCOSE 384* 405*  BUN 11 12  CREATININE 0.90 0.69  CALCIUM  --  9.0   Liver Function Tests: No results found for this basename: AST, ALT, ALKPHOS, BILITOT, PROT, ALBUMIN,  in the last 168 hours No results found for this basename: LIPASE, AMYLASE,  in the last 168 hours No results found for this basename: AMMONIA,  in the last 168 hours CBC:  Recent Labs Lab 06/02/12 0105 06/02/12 0115 06/02/12 0430  WBC 6.4  --  6.6  HGB 12.8 12.9 13.1  HCT 37.4 38.0 37.3  MCV 87.2  --  86.3  PLT 225  --  216   Cardiac Enzymes: No results found for this basename: CKTOTAL, CKMB, CKMBINDEX, TROPONINI,  in the last 168 hours BNP: No components found with this basename: POCBNP,  CBG:  Recent Labs Lab 06/02/12 0738  GLUCAP 412*    No results found for this or any previous visit (from the past 240 hour(s)).   Scheduled Meds: . amLODipine  2.5 mg Oral q morning - 10a  . enoxaparin (  LOVENOX) injection  40 mg Subcutaneous Q24H  . famotidine (PEPCID) IV  20 mg Intravenous Q12H  . insulin aspart  0-20 Units Subcutaneous TID WC  . insulin aspart  0-5 Units Subcutaneous QHS  . insulin aspart  4 Units Subcutaneous TID WC  . methylPREDNISolone (SOLU-MEDROL) injection  40 mg Intravenous Daily  . metoCLOPramide  10 mg Oral Q6H  . metoprolol tartrate  50 mg Oral BID  . sodium chloride  3 mL Intravenous Q12H  . triamcinolone ointment  1 application Topical BID   Continuous Infusions: . sodium chloride 100  mL/hr at 06/02/12 1056     Flynn Lininger, DO  [facility] Pager [phone]  If 7PM-7AM, please contact night-coverage [email or web address] Password [email or web address] 06/02/2012, 11:12 AM   LOS: 0 days

## 2012-06-03 ENCOUNTER — Telehealth: Payer: Self-pay | Admitting: Family Medicine

## 2012-06-03 DIAGNOSIS — T783XXA Angioneurotic edema, initial encounter: Secondary | ICD-10-CM | POA: Diagnosis not present

## 2012-06-03 DIAGNOSIS — IMO0001 Reserved for inherently not codable concepts without codable children: Secondary | ICD-10-CM | POA: Diagnosis not present

## 2012-06-03 LAB — CBC
HCT: 37 % (ref 36.0–46.0)
Hemoglobin: 12.8 g/dL (ref 12.0–15.0)
MCH: 30 pg (ref 26.0–34.0)
MCHC: 34.6 g/dL (ref 30.0–36.0)
RDW: 13.1 % (ref 11.5–15.5)

## 2012-06-03 LAB — COMPREHENSIVE METABOLIC PANEL
ALT: 19 U/L (ref 0–35)
AST: 14 U/L (ref 0–37)
Albumin: 2.9 g/dL — ABNORMAL LOW (ref 3.5–5.2)
Alkaline Phosphatase: 85 U/L (ref 39–117)
CO2: 20 mEq/L (ref 19–32)
Chloride: 104 mEq/L (ref 96–112)
Creatinine, Ser: 0.65 mg/dL (ref 0.50–1.10)
GFR calc non Af Amer: 89 mL/min — ABNORMAL LOW (ref >90)
Potassium: 4 mEq/L (ref 3.5–5.1)
Sodium: 135 mEq/L (ref 135–145)
Total Bilirubin: 0.2 mg/dL — ABNORMAL LOW (ref 0.3–1.2)

## 2012-06-03 LAB — LIPID PANEL
LDL Cholesterol: 113 mg/dL — ABNORMAL HIGH (ref 0–99)
Triglycerides: 79 mg/dL (ref <150)
VLDL: 16 mg/dL (ref 0–40)

## 2012-06-03 LAB — GLUCOSE, CAPILLARY
Glucose-Capillary: 322 mg/dL — ABNORMAL HIGH (ref 70–99)
Glucose-Capillary: 243 mg/dL — ABNORMAL HIGH (ref 70–99)

## 2012-06-03 MED ORDER — PREDNISONE 50 MG PO TABS: ORAL_TABLET | ORAL | Status: DC

## 2012-06-03 MED ORDER — EPINEPHRINE 0.3 MG/0.3ML IJ DEVI: 0.3000 mg | Freq: Once | INTRAMUSCULAR | Status: DC

## 2012-06-03 MED ORDER — DIPHENHYDRAMINE HCL 25 MG PO CAPS: 25.0000 mg | ORAL_CAPSULE | Freq: Four times a day (QID) | ORAL | Status: DC | PRN

## 2012-06-03 MED ORDER — FAMOTIDINE 20 MG PO TABS: 20.0000 mg | ORAL_TABLET | Freq: Two times a day (BID) | ORAL | Status: DC

## 2012-06-03 NOTE — Discharge Summary (Addendum)
Physician Discharge Summary  KAELAN EMAMI ZOX:[id number] DOB: 09/25/1945 DOA: 06/02/2012  PCP: Eustaquio Boyden, MD  Admit date: 06/02/2012 Discharge date: 06/03/2012  Time spent: 25 minutes  Recommendations for Outpatient Follow-up:  1. Home with outpt follow up with allergist Dr [place]ia Callas on 04/02 at 8:45 am  Discharge Diagnoses:  Principal Problem:   Angioedema  Active Problems:   Hypertension   Vitiligo   Dermatitis   Diabetes mellitus type 2, uncontrolled   Discharge Condition:fair  Diet recommendation: diabetic  Filed Weights   06/02/12 0340  Weight: 63.3 kg (139 lb 8.8 oz)    History of present illness:  Please refer to admission H&P for details , but in brief, 67 y.o. female with previous history of angioedema  presented to the ER because of slowly worsening swelling of the lips and tongue. Patient noticed swelling of her lower lip day before yesterday which slowly worsened and started to involve her tongue. Patient did not have any difficulty speaking or breathing. Patient does have itching which is chronic from her known dermatitis. In the ER patient was given epinephrine injection along with Solu-Medrol and Pepcid Benadryl and has been admitted for further observation. Patient states her swelling of the tongue has largely resolved and at this time patient is not in acute distress.   Hospital Course:  Patient admitted to medical floor under observation. By the time should admitted her symptoms had much resolved. She was continued on steroids, Benadryl and Pepcid. Labs unremarkable. No clear etiology for angioedema symptoms. No new medications to trigger symptoms. Denies any weight loss symptoms. This is her fourth episode since 2012 when she had her first symptoms. -Patient was previously prescribed EpiPen but has run out off it at present. -Patient is clinically stable and I will discharge her on a course of oral prednisone, Pepcid and as needed Benadryl. I will also  prescribe her EpiPen. I have given her clear instructions about symptoms of angioedema and advised her to return to the ED immediately if she has similar or worse symptoms. -Schedule an appointment with allergist Dr. [place] Dara Callas for April 2 at 8:45 AM. She needs to be off any antihistaminics 3 days prior to her visit.  Uncontrolled diabetes Hemoglobin A1c of 11.7. Patient only on oral hypoglycemics. Being currently on steroid will further worsen her diabetes. Recommend starting on insulin and following up with her PCP.  - patient clinically stable for discharge home.  Procedures: None Consultations:  None  Discharge Exam: Filed Vitals:   06/02/12 1437 06/02/12 2057 06/02/12 2249 06/03/12 0628  BP: 109/56 130/59 135/32 115/68  Pulse: 106 102 105 86  Temp: 97.5 F (36.4 C) 97.7 F (36.5 C)  97.7 F (36.5 C)  TempSrc: Oral Oral  Oral  Resp: 18 18  18   Height:      Weight:      SpO2: 96% 99%  98%    General: Elderly female in no acute distress HEENT: No pallor, moist oral mucosa, no lip or tongue swelling, no rash Cardiovascular: Normal S1 and S2, no murmurs rub or gallop Respiratory: Clear to auscultation bilaterally no added sounds And: Soft, nontender, nondistended, bowel sounds present Extremities: Warm, no edema CNS: AAO x3  Discharge Instructions  Discharge Orders   Future Appointments Provider Department Dept Phone   07/17/2012 3:45 PM Eustaquio Boyden, MD [facility] [phone]   Future Orders Complete By Expires     Ambulatory referral to Nutrition and Diabetic Education  As directed  Comments:      A1C=11.7%.        Medication List    TAKE these medications       acyclovir ointment 5 %  Commonly known as:  ZOVIRAX  Apply topically 5 (five) times daily. For 4 days     amLODipine 2.5 MG tablet  Commonly known as:  NORVASC  Take 2.5 mg by mouth every morning.     diphenhydrAMINE 25 mg capsule  Commonly known as:  BENADRYL  Take 1  capsule (25 mg total) by mouth every 6 (six) hours as needed for itching or allergies (angioedema).     EPINEPHrine 0.3 mg/0.3 mL Devi  Commonly known as:  EPIPEN  Inject 0.3 mLs (0.3 mg total) into the muscle once.     famotidine 20 MG tablet  Commonly known as:  PEPCID  Take 1 tablet (20 mg total) by mouth 2 (two) times daily.     glipiZIDE 10 MG tablet  Commonly known as:  GLUCOTROL  Take 10 mg by mouth every morning.     metFORMIN 500 MG tablet  Commonly known as:  GLUCOPHAGE  Take 500-1,000 mg by mouth 2 (two) times daily. Take 1 tablet in the morning and 2 tablets in the evening     metoCLOPramide 10 MG tablet  Commonly known as:  REGLAN  Take 1 tablet (10 mg total) by mouth every 6 (six) hours.     metoprolol 50 MG tablet  Commonly known as:  LOPRESSOR  Take 1 tablet (50 mg total) by mouth 2 (two) times daily.     predniSONE 50 MG tablet  Commonly known as:  DELTASONE  For 4 days     traMADol 50 MG tablet  Commonly known as:  ULTRAM  Take 50 mg by mouth every 8 (eight) hours as needed (for pain). For pain     traZODone 50 MG tablet  Commonly known as:  DESYREL  Take 25-50 mg by mouth at bedtime as needed for sleep (for sleep).     triamcinolone ointment 0.1 %  Commonly known as:  KENALOG  Apply 1 application topically 2 (two) times daily.           Follow-up Information   Follow up with Eustaquio Boyden, MD In 1 week.   Contact information:   [address] [place] [address] [phone]       Follow up with Sidney Ace, MD On 06/10/2012. (at 8:45 am. please be off any benadryl , pepcid or steroid from 3/29)    Contact information:   [address], [address] [place] [address] [phone]        The results of significant diagnostics from this hospitalization (including imaging, microbiology, ancillary and laboratory) are listed below for reference.    Significant Diagnostic Studies: No results found.  Microbiology: No  results found for this or any previous visit (from the past 240 hour(s)).   Labs: Basic Metabolic Panel:  Recent Labs Lab 06/02/12 0115 06/02/12 0430 06/03/12 0425  NA 136 136 135  K 4.0 3.9 4.0  CL 106 102 104  CO2  --  22 20  GLUCOSE 384* 405* 362*  BUN 11 12 15   CREATININE 0.90 0.69 0.65  CALCIUM  --  9.0 8.8   Liver Function Tests:  Recent Labs Lab 06/03/12 0425  AST 14  ALT 19  ALKPHOS 85  BILITOT 0.2*  PROT 6.0  ALBUMIN 2.9*   No results found for this basename:  LIPASE, AMYLASE,  in the last 168 hours No results found for this basename: AMMONIA,  in the last 168 hours CBC:  Recent Labs Lab 06/02/12 0105 06/02/12 0115 06/02/12 0430 06/03/12 0425  WBC 6.4  --  6.6 16.2*  HGB 12.8 12.9 13.1 12.8  HCT 37.4 38.0 37.3 37.0  MCV 87.2  --  86.3 86.9  PLT 225  --  216 236   Cardiac Enzymes: No results found for this basename: CKTOTAL, CKMB, CKMBINDEX, TROPONINI,  in the last 168 hours BNP: BNP (last 3 results) No results found for this basename: PROBNP,  in the last 8760 hours CBG:  Recent Labs Lab 06/02/12 0738 06/02/12 1204 06/02/12 1640 06/02/12 2138 06/03/12 0808  GLUCAP 412* 331* 248* 271* 322*       Signed:  Santosha Jividen  [facility] 06/03/2012, 10:37 AM

## 2012-06-03 NOTE — Telephone Encounter (Signed)
Noted pt hospitalized with allergic reaction, very high sugars.   appt with Dr. [place] Callas scheduled. Can we have her f/u with me in next 2 weeks for high sugars?  Thanks. Lab Results  Component Value Date   HGBA1C 11.7* 06/02/2012

## 2012-06-05 NOTE — Telephone Encounter (Signed)
Message left for patient to return my call and schedule hospital follow up.

## 2012-06-10 NOTE — Telephone Encounter (Signed)
Appt scheduled

## 2012-06-19 ENCOUNTER — Ambulatory Visit: Payer: Medicare Other | Admitting: Family Medicine

## 2012-06-23 ENCOUNTER — Ambulatory Visit: Payer: Medicare Other | Admitting: *Deleted

## 2012-06-30 ENCOUNTER — Other Ambulatory Visit: Payer: Self-pay | Admitting: Internal Medicine

## 2012-07-17 ENCOUNTER — Ambulatory Visit: Payer: Medicare Other | Admitting: Family Medicine

## 2012-08-01 ENCOUNTER — Encounter (HOSPITAL_COMMUNITY): Payer: Self-pay

## 2012-08-01 ENCOUNTER — Emergency Department (HOSPITAL_COMMUNITY)
Admission: EM | Admit: 2012-08-01 | Discharge: 2012-08-01 | Disposition: A | Discharge status: Home / Self Care | Payer: Medicare Other | Attending: Emergency Medicine | Admitting: Emergency Medicine

## 2012-08-01 DIAGNOSIS — E119 Type 2 diabetes mellitus without complications: Secondary | ICD-10-CM | POA: Insufficient documentation

## 2012-08-01 DIAGNOSIS — I10 Essential (primary) hypertension: Secondary | ICD-10-CM | POA: Insufficient documentation

## 2012-08-01 DIAGNOSIS — L299 Pruritus, unspecified: Secondary | ICD-10-CM | POA: Diagnosis not present

## 2012-08-01 DIAGNOSIS — Z8679 Personal history of other diseases of the circulatory system: Secondary | ICD-10-CM | POA: Insufficient documentation

## 2012-08-01 DIAGNOSIS — T783XXA Angioneurotic edema, initial encounter: Secondary | ICD-10-CM | POA: Insufficient documentation

## 2012-08-01 DIAGNOSIS — Z862 Personal history of diseases of the blood and blood-forming organs and certain disorders involving the immune mechanism: Secondary | ICD-10-CM | POA: Diagnosis not present

## 2012-08-01 DIAGNOSIS — Z8619 Personal history of other infectious and parasitic diseases: Secondary | ICD-10-CM | POA: Insufficient documentation

## 2012-08-01 DIAGNOSIS — Z872 Personal history of diseases of the skin and subcutaneous tissue: Secondary | ICD-10-CM | POA: Insufficient documentation

## 2012-08-01 DIAGNOSIS — Z880 Allergy status to penicillin: Secondary | ICD-10-CM | POA: Insufficient documentation

## 2012-08-01 DIAGNOSIS — Z961 Presence of intraocular lens: Secondary | ICD-10-CM | POA: Diagnosis not present

## 2012-08-01 DIAGNOSIS — Z8639 Personal history of other endocrine, nutritional and metabolic disease: Secondary | ICD-10-CM | POA: Insufficient documentation

## 2012-08-01 DIAGNOSIS — T4995XA Adverse effect of unspecified topical agent, initial encounter: Secondary | ICD-10-CM | POA: Insufficient documentation

## 2012-08-01 HISTORY — DX: Human immunodeficiency virus (HIV) disease: B20

## 2012-08-01 HISTORY — DX: Reserved for inherently not codable concepts without codable children: IMO0001

## 2012-08-01 HISTORY — DX: Other specified systemic involvement of connective tissue: M35.8

## 2012-08-01 LAB — CBC WITH DIFFERENTIAL/PLATELET
Eosinophils Absolute: 0.1 10*3/uL (ref 0.0–0.7)
Hemoglobin: 14.3 g/dL (ref 12.0–15.0)
Lymphs Abs: 1.2 10*3/uL (ref 0.7–4.0)
MCH: 30.5 pg (ref 26.0–34.0)
Neutro Abs: 6 10*3/uL (ref 1.7–7.7)
Neutrophils Relative %: 80 % — ABNORMAL HIGH (ref 43–77)
Platelets: 257 10*3/uL (ref 150–400)
RBC: 4.69 MIL/uL (ref 3.87–5.11)
WBC: 7.5 10*3/uL (ref 4.0–10.5)

## 2012-08-01 LAB — BASIC METABOLIC PANEL
Chloride: 113 mEq/L — ABNORMAL HIGH (ref 96–112)
GFR calc Af Amer: >90 mL/min (ref >90)
GFR calc non Af Amer: 87 mL/min — ABNORMAL LOW (ref >90)
Glucose, Bld: 152 mg/dL — ABNORMAL HIGH (ref 70–99)
Potassium: 3.3 mEq/L — ABNORMAL LOW (ref 3.5–5.1)
Sodium: 143 mEq/L (ref 135–145)

## 2012-08-01 MED ORDER — PREDNISONE 20 MG PO TABS: ORAL_TABLET | ORAL | Status: DC

## 2012-08-01 MED ORDER — FAMOTIDINE IN NACL 20-0.9 MG/50ML-% IV SOLN
20.0000 mg | Freq: Once | INTRAVENOUS | Status: AC
  Administered 2012-08-01: 20 mg via INTRAVENOUS
  Filled 2012-08-01: qty 50

## 2012-08-01 MED ORDER — METHYLPREDNISOLONE SODIUM SUCC 125 MG IJ SOLR
125.0000 mg | Freq: Once | INTRAMUSCULAR | Status: AC
  Administered 2012-08-01: 125 mg via INTRAVENOUS
  Filled 2012-08-01: qty 2

## 2012-08-01 MED ORDER — POTASSIUM CHLORIDE CRYS ER 20 MEQ PO TBCR
40.0000 meq | EXTENDED_RELEASE_TABLET | Freq: Once | ORAL | Status: AC
  Administered 2012-08-01: 40 meq via ORAL
  Filled 2012-08-01: qty 2

## 2012-08-01 MED ORDER — DIPHENHYDRAMINE HCL 50 MG/ML IJ SOLN
25.0000 mg | Freq: Once | INTRAMUSCULAR | Status: AC
  Administered 2012-08-01: 25 mg via INTRAVENOUS
  Filled 2012-08-01: qty 1

## 2012-08-01 MED ORDER — EPINEPHRINE 0.3 MG/0.3ML IJ SOAJ: 0.3000 mg | Freq: Once | INTRAMUSCULAR | Status: DC | PRN

## 2012-08-01 NOTE — ED Notes (Signed)
She c/o swelling at lips with some swelling of gums and tongue, non of which are severe at present.  The swelling of upper and lower lips is visible.  She further tells me she has been hospitalized more than once with angioedema and no cause was ever elucidated.  She is breathing normally and is in no distress.

## 2012-08-01 NOTE — ED Provider Notes (Signed)
3:49 PM BP 168/78  Pulse 86  Temp(Src) 97.7 F (36.5 C) (Oral)  Resp 20  SpO2 97% Assumed care of the patient from Dr. Estell Harpin. Patient with history of Angioedema. Admitted several times for the same. Plan to treat and observe patient here in the ED observe to avoid admission.    6:44 PM BP 165/88  Pulse 73  Temp(Src) 98.5 F (36.9 C) (Oral)  Resp 20  SpO2 97% Patient sleepy, but her angioedema is resolving. Th patient does not appear to have any respiratory compromise and i feel that she is safe for discharge at this time. Will d/c patient with prednisone and epipen. i have urged her to call Dr. [place] Callas and follow up as soon as possible.   Arthor Captain, PA-C 08/01/12 1952

## 2012-08-01 NOTE — ED Notes (Signed)
Patient is sleeping. Facial swelling is subsiding.

## 2012-08-01 NOTE — ED Notes (Signed)
Patient lips started swelling last night. Facial swelling including lips. No difficulty breathing. Patient has red dry patchy sore areas throughout her body. She states she notices that this occurs when she gets upset. She and her significant other have had problems the past few days.
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
## 2012-08-01 NOTE — ED Notes (Signed)
Patient does not have a ride home. Requesting to stay overnight.

## 2012-08-01 NOTE — ED Notes (Signed)
Per Cammy Copa, patient advised we are unable to keep her overnight as there is no medical indication. Patient upset that the "doctor" asked her earlier if she wanted to stay or go home, and now she is being told she will be discharged.

## 2012-08-02 NOTE — ED Provider Notes (Signed)
Medical screening examination/treatment/procedure(s) were performed by non-physician practitioner and as supervising physician I was immediately available for consultation/collaboration.    Deondrea Aguado D Jailyn Langhorst, MD 08/02/12 1238 

## 2012-09-17 ENCOUNTER — Other Ambulatory Visit: Payer: Self-pay

## 2012-11-19 ENCOUNTER — Ambulatory Visit: Payer: Medicare Other | Admitting: Family Medicine

## 2012-11-26 ENCOUNTER — Ambulatory Visit: Payer: Medicare Other | Admitting: Family Medicine

## 2012-12-02 DIAGNOSIS — R21 Rash and other nonspecific skin eruption: Secondary | ICD-10-CM | POA: Diagnosis not present

## 2012-12-02 DIAGNOSIS — I10 Essential (primary) hypertension: Secondary | ICD-10-CM | POA: Diagnosis not present

## 2012-12-03 DIAGNOSIS — I10 Essential (primary) hypertension: Secondary | ICD-10-CM | POA: Diagnosis not present

## 2012-12-03 DIAGNOSIS — R21 Rash and other nonspecific skin eruption: Secondary | ICD-10-CM | POA: Diagnosis not present

## 2013-01-22 DIAGNOSIS — Z23 Encounter for immunization: Secondary | ICD-10-CM | POA: Diagnosis not present

## 2013-01-25 ENCOUNTER — Encounter: Payer: Self-pay | Admitting: Family Medicine

## 2013-03-09 DIAGNOSIS — E1139 Type 2 diabetes mellitus with other diabetic ophthalmic complication: Secondary | ICD-10-CM | POA: Diagnosis not present

## 2013-03-09 DIAGNOSIS — Z961 Presence of intraocular lens: Secondary | ICD-10-CM | POA: Diagnosis not present

## 2013-03-09 DIAGNOSIS — E11329 Type 2 diabetes mellitus with mild nonproliferative diabetic retinopathy without macular edema: Secondary | ICD-10-CM | POA: Diagnosis not present

## 2013-03-09 DIAGNOSIS — H2589 Other age-related cataract: Secondary | ICD-10-CM | POA: Diagnosis not present

## 2013-03-09 DIAGNOSIS — E119 Type 2 diabetes mellitus without complications: Secondary | ICD-10-CM | POA: Diagnosis not present

## 2013-03-09 LAB — HM DIABETES EYE EXAM

## 2013-03-11 HISTORY — PX: CATARACT EXTRACTION: SUR2

## 2013-03-30 DIAGNOSIS — H2589 Other age-related cataract: Secondary | ICD-10-CM | POA: Diagnosis not present

## 2013-04-05 DIAGNOSIS — H2510 Age-related nuclear cataract, unspecified eye: Secondary | ICD-10-CM | POA: Diagnosis not present

## 2013-04-05 DIAGNOSIS — H2589 Other age-related cataract: Secondary | ICD-10-CM | POA: Diagnosis not present

## 2013-04-05 DIAGNOSIS — H25049 Posterior subcapsular polar age-related cataract, unspecified eye: Secondary | ICD-10-CM | POA: Diagnosis not present

## 2013-04-08 ENCOUNTER — Encounter: Payer: Self-pay | Admitting: Family Medicine

## 2013-04-15 LAB — HM DIABETES EYE EXAM

## 2013-06-06 ENCOUNTER — Encounter: Payer: Self-pay | Admitting: Family Medicine

## 2013-06-15 ENCOUNTER — Emergency Department (HOSPITAL_COMMUNITY)
Admission: EM | Admit: 2013-06-15 | Discharge: 2013-06-15 | Disposition: A | Discharge status: Home / Self Care | Payer: Medicare Other | Attending: Emergency Medicine | Admitting: Emergency Medicine

## 2013-06-15 ENCOUNTER — Encounter (HOSPITAL_COMMUNITY): Payer: Self-pay | Admitting: Emergency Medicine

## 2013-06-15 DIAGNOSIS — E119 Type 2 diabetes mellitus without complications: Secondary | ICD-10-CM | POA: Insufficient documentation

## 2013-06-15 DIAGNOSIS — J209 Acute bronchitis, unspecified: Secondary | ICD-10-CM | POA: Insufficient documentation

## 2013-06-15 DIAGNOSIS — I4510 Unspecified right bundle-branch block: Secondary | ICD-10-CM

## 2013-06-15 DIAGNOSIS — J069 Acute upper respiratory infection, unspecified: Secondary | ICD-10-CM | POA: Diagnosis not present

## 2013-06-15 DIAGNOSIS — Z8619 Personal history of other infectious and parasitic diseases: Secondary | ICD-10-CM | POA: Insufficient documentation

## 2013-06-15 DIAGNOSIS — Z880 Allergy status to penicillin: Secondary | ICD-10-CM | POA: Diagnosis not present

## 2013-06-15 DIAGNOSIS — I10 Essential (primary) hypertension: Secondary | ICD-10-CM | POA: Insufficient documentation

## 2013-06-15 DIAGNOSIS — J40 Bronchitis, not specified as acute or chronic: Secondary | ICD-10-CM

## 2013-06-15 DIAGNOSIS — Z872 Personal history of diseases of the skin and subcutaneous tissue: Secondary | ICD-10-CM | POA: Insufficient documentation

## 2013-06-15 DIAGNOSIS — H9209 Otalgia, unspecified ear: Secondary | ICD-10-CM | POA: Diagnosis not present

## 2013-06-15 MED ORDER — PREDNISONE 20 MG PO TABS: 20.0000 mg | ORAL_TABLET | Freq: Every day | ORAL | Status: DC

## 2013-06-15 MED ORDER — ALBUTEROL SULFATE HFA 108 (90 BASE) MCG/ACT IN AERS: 1.0000 | INHALATION_SPRAY | Freq: Four times a day (QID) | RESPIRATORY_TRACT | Status: DC | PRN

## 2013-06-15 MED ORDER — AZITHROMYCIN 250 MG PO TABS: ORAL_TABLET | ORAL | Status: DC

## 2013-06-15 MED ORDER — PREDNISONE 20 MG PO TABS
40.0000 mg | ORAL_TABLET | Freq: Once | ORAL | Status: AC
  Administered 2013-06-15: 40 mg via ORAL
  Filled 2013-06-15: qty 2

## 2013-06-15 MED ORDER — HYDROCOD POLST-CHLORPHEN POLST 10-8 MG/5ML PO LQCR: 5.0000 mL | Freq: Two times a day (BID) | ORAL | Status: DC | PRN

## 2013-06-15 NOTE — Discharge Instructions (Signed)

## 2013-06-15 NOTE — ED Notes (Signed)
Pt reports cough, sore throat, sinus pain x1 week. Also reports chest pain upon inspiration and coughing. Pain 7/10.
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
## 2013-07-21 ENCOUNTER — Ambulatory Visit: Payer: Self-pay | Admitting: Podiatrist

## 2013-09-17 ENCOUNTER — Emergency Department (HOSPITAL_COMMUNITY)
Admission: EM | Admit: 2013-09-17 | Discharge: 2013-09-17 | Disposition: A | Discharge status: Home / Self Care | Payer: Medicare Other | Attending: Emergency Medicine | Admitting: Emergency Medicine

## 2013-09-17 ENCOUNTER — Encounter (HOSPITAL_COMMUNITY): Payer: Self-pay | Admitting: Emergency Medicine

## 2013-09-17 DIAGNOSIS — I10 Essential (primary) hypertension: Secondary | ICD-10-CM | POA: Insufficient documentation

## 2013-09-17 DIAGNOSIS — S199XXA Unspecified injury of neck, initial encounter: Secondary | ICD-10-CM

## 2013-09-17 DIAGNOSIS — Z8669 Personal history of other diseases of the nervous system and sense organs: Secondary | ICD-10-CM | POA: Diagnosis not present

## 2013-09-17 DIAGNOSIS — Y9389 Activity, other specified: Secondary | ICD-10-CM | POA: Insufficient documentation

## 2013-09-17 DIAGNOSIS — Z791 Long term (current) use of non-steroidal anti-inflammatories (NSAID): Secondary | ICD-10-CM | POA: Diagnosis not present

## 2013-09-17 DIAGNOSIS — Y92410 Unspecified street and highway as the place of occurrence of the external cause: Secondary | ICD-10-CM | POA: Insufficient documentation

## 2013-09-17 DIAGNOSIS — Z87828 Personal history of other (healed) physical injury and trauma: Secondary | ICD-10-CM | POA: Diagnosis not present

## 2013-09-17 DIAGNOSIS — IMO0002 Reserved for concepts with insufficient information to code with codable children: Secondary | ICD-10-CM | POA: Diagnosis not present

## 2013-09-17 DIAGNOSIS — M5489 Other dorsalgia: Secondary | ICD-10-CM

## 2013-09-17 DIAGNOSIS — Z880 Allergy status to penicillin: Secondary | ICD-10-CM | POA: Diagnosis not present

## 2013-09-17 DIAGNOSIS — Z872 Personal history of diseases of the skin and subcutaneous tissue: Secondary | ICD-10-CM | POA: Diagnosis not present

## 2013-09-17 DIAGNOSIS — S0993XA Unspecified injury of face, initial encounter: Secondary | ICD-10-CM | POA: Diagnosis not present

## 2013-09-17 DIAGNOSIS — E119 Type 2 diabetes mellitus without complications: Secondary | ICD-10-CM | POA: Insufficient documentation

## 2013-09-17 MED ORDER — DIAZEPAM 5 MG PO TABS: 5.0000 mg | ORAL_TABLET | Freq: Three times a day (TID) | ORAL | Status: DC | PRN
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
## 2013-09-18 NOTE — ED Provider Notes (Signed)
Medical screening examination/treatment/procedure(s) were performed by non-physician practitioner and as supervising physician I was immediately available for consultation/collaboration.   EKG Interpretation None        Ezequiel Essex, MD 09/18/13 0000

## 2013-12-21 DIAGNOSIS — Z23 Encounter for immunization: Secondary | ICD-10-CM | POA: Diagnosis not present

## 2014-03-11 HISTORY — PX: CARDIOVASCULAR STRESS TEST: SHX262

## 2014-03-22 ENCOUNTER — Encounter (HOSPITAL_COMMUNITY): Payer: Self-pay | Admitting: Emergency Medicine

## 2014-03-22 ENCOUNTER — Emergency Department (HOSPITAL_COMMUNITY): Payer: Medicare Other

## 2014-03-22 DIAGNOSIS — J209 Acute bronchitis, unspecified: Secondary | ICD-10-CM | POA: Diagnosis not present

## 2014-03-22 DIAGNOSIS — G43909 Migraine, unspecified, not intractable, without status migrainosus: Secondary | ICD-10-CM | POA: Diagnosis present

## 2014-03-22 DIAGNOSIS — J9811 Atelectasis: Secondary | ICD-10-CM | POA: Diagnosis not present

## 2014-03-22 DIAGNOSIS — Z9841 Cataract extraction status, right eye: Secondary | ICD-10-CM

## 2014-03-22 DIAGNOSIS — J40 Bronchitis, not specified as acute or chronic: Secondary | ICD-10-CM | POA: Diagnosis not present

## 2014-03-22 DIAGNOSIS — G629 Polyneuropathy, unspecified: Secondary | ICD-10-CM | POA: Diagnosis present

## 2014-03-22 DIAGNOSIS — L309 Dermatitis, unspecified: Secondary | ICD-10-CM | POA: Diagnosis present

## 2014-03-22 DIAGNOSIS — F329 Major depressive disorder, single episode, unspecified: Secondary | ICD-10-CM | POA: Diagnosis present

## 2014-03-22 DIAGNOSIS — J111 Influenza due to unidentified influenza virus with other respiratory manifestations: Principal | ICD-10-CM | POA: Diagnosis present

## 2014-03-22 DIAGNOSIS — Z880 Allergy status to penicillin: Secondary | ICD-10-CM

## 2014-03-22 DIAGNOSIS — I10 Essential (primary) hypertension: Secondary | ICD-10-CM | POA: Diagnosis not present

## 2014-03-22 DIAGNOSIS — R05 Cough: Secondary | ICD-10-CM | POA: Diagnosis not present

## 2014-03-22 DIAGNOSIS — Z882 Allergy status to sulfonamides status: Secondary | ICD-10-CM

## 2014-03-22 DIAGNOSIS — Z886 Allergy status to analgesic agent status: Secondary | ICD-10-CM

## 2014-03-22 DIAGNOSIS — E1165 Type 2 diabetes mellitus with hyperglycemia: Secondary | ICD-10-CM | POA: Diagnosis not present

## 2014-03-22 DIAGNOSIS — Z9119 Patient's noncompliance with other medical treatment and regimen: Secondary | ICD-10-CM | POA: Diagnosis present

## 2014-03-22 DIAGNOSIS — I472 Ventricular tachycardia: Secondary | ICD-10-CM | POA: Diagnosis present

## 2014-03-22 DIAGNOSIS — E876 Hypokalemia: Secondary | ICD-10-CM | POA: Diagnosis present

## 2014-03-22 DIAGNOSIS — J069 Acute upper respiratory infection, unspecified: Secondary | ICD-10-CM | POA: Diagnosis present

## 2014-03-22 DIAGNOSIS — Z9114 Patient's other noncompliance with medication regimen: Secondary | ICD-10-CM | POA: Diagnosis present

## 2014-03-22 DIAGNOSIS — R000 Tachycardia, unspecified: Secondary | ICD-10-CM | POA: Diagnosis not present

## 2014-03-22 DIAGNOSIS — Z961 Presence of intraocular lens: Secondary | ICD-10-CM | POA: Diagnosis present

## 2014-03-22 DIAGNOSIS — R0602 Shortness of breath: Secondary | ICD-10-CM | POA: Diagnosis not present

## 2014-03-22 DIAGNOSIS — E785 Hyperlipidemia, unspecified: Secondary | ICD-10-CM | POA: Diagnosis present

## 2014-03-22 NOTE — ED Notes (Addendum)
Pt reports nasal and chest congestion with productive cough x 3 days. Pt also reports chills, sore throat and body aches. Pt also c/o intensified psoriasis on arms and legs since she has been sick.

## 2014-03-23 ENCOUNTER — Inpatient Hospital Stay (HOSPITAL_COMMUNITY)
Admission: EM | Admit: 2014-03-23 | Discharge: 2014-03-28 | DRG: 153 | Disposition: A | Discharge status: Home / Self Care | Payer: Medicare Other | Attending: Internal Medicine | Admitting: Internal Medicine

## 2014-03-23 DIAGNOSIS — R000 Tachycardia, unspecified: Secondary | ICD-10-CM

## 2014-03-23 DIAGNOSIS — J069 Acute upper respiratory infection, unspecified: Secondary | ICD-10-CM | POA: Diagnosis not present

## 2014-03-23 DIAGNOSIS — E1165 Type 2 diabetes mellitus with hyperglycemia: Secondary | ICD-10-CM

## 2014-03-23 DIAGNOSIS — Z9841 Cataract extraction status, right eye: Secondary | ICD-10-CM | POA: Diagnosis not present

## 2014-03-23 DIAGNOSIS — Z880 Allergy status to penicillin: Secondary | ICD-10-CM | POA: Diagnosis not present

## 2014-03-23 DIAGNOSIS — Z882 Allergy status to sulfonamides status: Secondary | ICD-10-CM | POA: Diagnosis not present

## 2014-03-23 DIAGNOSIS — Z9119 Patient's noncompliance with other medical treatment and regimen: Secondary | ICD-10-CM | POA: Diagnosis present

## 2014-03-23 DIAGNOSIS — Z9114 Patient's other noncompliance with medication regimen: Secondary | ICD-10-CM | POA: Diagnosis present

## 2014-03-23 DIAGNOSIS — J40 Bronchitis, not specified as acute or chronic: Secondary | ICD-10-CM

## 2014-03-23 DIAGNOSIS — R69 Illness, unspecified: Secondary | ICD-10-CM

## 2014-03-23 DIAGNOSIS — R51 Headache: Secondary | ICD-10-CM | POA: Diagnosis not present

## 2014-03-23 DIAGNOSIS — I10 Essential (primary) hypertension: Secondary | ICD-10-CM | POA: Diagnosis present

## 2014-03-23 DIAGNOSIS — G629 Polyneuropathy, unspecified: Secondary | ICD-10-CM | POA: Diagnosis not present

## 2014-03-23 DIAGNOSIS — R059 Cough, unspecified: Secondary | ICD-10-CM

## 2014-03-23 DIAGNOSIS — R519 Headache, unspecified: Secondary | ICD-10-CM

## 2014-03-23 DIAGNOSIS — Z961 Presence of intraocular lens: Secondary | ICD-10-CM | POA: Diagnosis present

## 2014-03-23 DIAGNOSIS — I472 Ventricular tachycardia: Secondary | ICD-10-CM | POA: Diagnosis not present

## 2014-03-23 DIAGNOSIS — E119 Type 2 diabetes mellitus without complications: Secondary | ICD-10-CM | POA: Diagnosis not present

## 2014-03-23 DIAGNOSIS — E1149 Type 2 diabetes mellitus with other diabetic neurological complication: Secondary | ICD-10-CM | POA: Diagnosis present

## 2014-03-23 DIAGNOSIS — G43909 Migraine, unspecified, not intractable, without status migrainosus: Secondary | ICD-10-CM | POA: Diagnosis present

## 2014-03-23 DIAGNOSIS — E785 Hyperlipidemia, unspecified: Secondary | ICD-10-CM

## 2014-03-23 DIAGNOSIS — J209 Acute bronchitis, unspecified: Secondary | ICD-10-CM | POA: Diagnosis not present

## 2014-03-23 DIAGNOSIS — E876 Hypokalemia: Secondary | ICD-10-CM | POA: Diagnosis not present

## 2014-03-23 DIAGNOSIS — L309 Dermatitis, unspecified: Secondary | ICD-10-CM | POA: Diagnosis present

## 2014-03-23 DIAGNOSIS — R05 Cough: Secondary | ICD-10-CM | POA: Diagnosis not present

## 2014-03-23 DIAGNOSIS — IMO0002 Reserved for concepts with insufficient information to code with codable children: Secondary | ICD-10-CM

## 2014-03-23 DIAGNOSIS — R6521 Severe sepsis with septic shock: Secondary | ICD-10-CM | POA: Diagnosis not present

## 2014-03-23 DIAGNOSIS — E1169 Type 2 diabetes mellitus with other specified complication: Secondary | ICD-10-CM | POA: Diagnosis present

## 2014-03-23 DIAGNOSIS — J111 Influenza due to unidentified influenza virus with other respiratory manifestations: Secondary | ICD-10-CM | POA: Diagnosis not present

## 2014-03-23 DIAGNOSIS — Z886 Allergy status to analgesic agent status: Secondary | ICD-10-CM | POA: Diagnosis not present

## 2014-03-23 DIAGNOSIS — F329 Major depressive disorder, single episode, unspecified: Secondary | ICD-10-CM | POA: Diagnosis present

## 2014-03-23 DIAGNOSIS — R079 Chest pain, unspecified: Secondary | ICD-10-CM

## 2014-03-23 DIAGNOSIS — A419 Sepsis, unspecified organism: Secondary | ICD-10-CM | POA: Diagnosis present

## 2014-03-23 LAB — GLUCOSE, CAPILLARY
GLUCOSE-CAPILLARY: 195 mg/dL — AB (ref 70–99)
Glucose-Capillary: 196 mg/dL — ABNORMAL HIGH (ref 70–99)
Glucose-Capillary: 328 mg/dL — ABNORMAL HIGH (ref 70–99)
Glucose-Capillary: 130 mg/dL — ABNORMAL HIGH (ref 70–99)

## 2014-03-23 LAB — CBC WITH DIFFERENTIAL/PLATELET
BASOS ABS: 0.1 10*3/uL (ref 0.0–0.1)
Basophils Relative: 1 % (ref 0–1)
Eosinophils Absolute: 0.3 10*3/uL (ref 0.0–0.7)
Eosinophils Relative: 4 % (ref 0–5)
HCT: 38.7 % (ref 36.0–46.0)
Hemoglobin: 13.5 g/dL (ref 12.0–15.0)
LYMPHS PCT: 30 % (ref 12–46)
Lymphs Abs: 1.8 10*3/uL (ref 0.7–4.0)
MCH: 30.3 pg (ref 26.0–34.0)
MCHC: 34.9 g/dL (ref 30.0–36.0)
MCV: 87 fL (ref 78.0–100.0)
Monocytes Absolute: 0.6 10*3/uL (ref 0.1–1.0)
Monocytes Relative: 9 % (ref 3–12)
NEUTROS ABS: 3.4 10*3/uL (ref 1.7–7.7)
Neutrophils Relative %: 56 % (ref 43–77)
Platelets: 207 10*3/uL (ref 150–400)
RBC: 4.45 MIL/uL (ref 3.87–5.11)
RDW: 12.5 % (ref 11.5–15.5)
WBC: 6.1 10*3/uL (ref 4.0–10.5)

## 2014-03-23 LAB — URINALYSIS, ROUTINE W REFLEX MICROSCOPIC
Bilirubin Urine: NEGATIVE
Glucose, UA: >1000 mg/dL — AB
HGB URINE DIPSTICK: NEGATIVE
Ketones, ur: NEGATIVE mg/dL
LEUKOCYTES UA: NEGATIVE
Nitrite: NEGATIVE
PH: 6.5 (ref 5.0–8.0)
Protein, ur: NEGATIVE mg/dL
Specific Gravity, Urine: 1.015 (ref 1.005–1.030)
Urobilinogen, UA: 0.2 mg/dL (ref 0.0–1.0)

## 2014-03-23 LAB — COMPREHENSIVE METABOLIC PANEL
ALBUMIN: 3.5 g/dL (ref 3.5–5.2)
ALT: 18 U/L (ref 0–35)
ANION GAP: 7 (ref 5–15)
AST: 28 U/L (ref 0–37)
Alkaline Phosphatase: 102 U/L (ref 39–117)
BILIRUBIN TOTAL: 0.4 mg/dL (ref 0.3–1.2)
BUN: 8 mg/dL (ref 6–23)
CO2: 25 mmol/L (ref 19–32)
Calcium: 8.6 mg/dL (ref 8.4–10.5)
Chloride: 100 mEq/L (ref 96–112)
Creatinine, Ser: 0.89 mg/dL (ref 0.50–1.10)
GFR, EST AFRICAN AMERICAN: 74 mL/min — AB (ref >90)
GFR, EST NON AFRICAN AMERICAN: 64 mL/min — AB (ref >90)
Glucose, Bld: 265 mg/dL — ABNORMAL HIGH (ref 70–99)
Potassium: 3.3 mmol/L — ABNORMAL LOW (ref 3.5–5.1)
Sodium: 132 mmol/L — ABNORMAL LOW (ref 135–145)
TOTAL PROTEIN: 6.7 g/dL (ref 6.0–8.3)

## 2014-03-23 LAB — URINE MICROSCOPIC-ADD ON

## 2014-03-23 LAB — INFLUENZA PANEL BY PCR (TYPE A & B)
H1N1 flu by pcr: NOT DETECTED
Influenza A By PCR: NEGATIVE
Influenza B By PCR: NEGATIVE

## 2014-03-23 MED ORDER — INSULIN ASPART 100 UNIT/ML ~~LOC~~ SOLN
0.0000 [IU] | Freq: Three times a day (TID) | SUBCUTANEOUS | Status: DC
  Administered 2014-03-23: 1 [IU] via SUBCUTANEOUS
  Administered 2014-03-23: 7 [IU] via SUBCUTANEOUS
  Administered 2014-03-24: 3 [IU] via SUBCUTANEOUS
  Administered 2014-03-24: 5 [IU] via SUBCUTANEOUS
  Administered 2014-03-24: 2 [IU] via SUBCUTANEOUS

## 2014-03-23 MED ORDER — AZITHROMYCIN 250 MG PO TABS
250.0000 mg | ORAL_TABLET | Freq: Every day | ORAL | Status: AC
  Administered 2014-03-24 – 2014-03-27 (×4): 250 mg via ORAL
  Filled 2014-03-23 (×4): qty 1

## 2014-03-23 MED ORDER — DM-GUAIFENESIN ER 30-600 MG PO TB12
1.0000 | ORAL_TABLET | Freq: Two times a day (BID) | ORAL | Status: DC
  Administered 2014-03-23 – 2014-03-28 (×11): 1 via ORAL
  Filled 2014-03-23 (×13): qty 1

## 2014-03-23 MED ORDER — KETOROLAC TROMETHAMINE 30 MG/ML IJ SOLN
INTRAMUSCULAR | Status: AC
  Administered 2014-03-23: 07:00:00
  Filled 2014-03-23: qty 1

## 2014-03-23 MED ORDER — ENOXAPARIN SODIUM 40 MG/0.4ML ~~LOC~~ SOLN
40.0000 mg | SUBCUTANEOUS | Status: DC
  Administered 2014-03-23 – 2014-03-27 (×5): 40 mg via SUBCUTANEOUS
  Filled 2014-03-23 (×6): qty 0.4

## 2014-03-23 MED ORDER — SODIUM CHLORIDE 0.9 % IV SOLN
INTRAVENOUS | Status: DC
  Administered 2014-03-23 – 2014-03-24 (×2): via INTRAVENOUS

## 2014-03-23 MED ORDER — AZITHROMYCIN 500 MG PO TABS
500.0000 mg | ORAL_TABLET | Freq: Every day | ORAL | Status: AC
  Administered 2014-03-23: 500 mg via ORAL
  Filled 2014-03-23: qty 1

## 2014-03-23 MED ORDER — LISINOPRIL 5 MG PO TABS
5.0000 mg | ORAL_TABLET | Freq: Every day | ORAL | Status: DC
  Administered 2014-03-23 – 2014-03-26 (×4): 5 mg via ORAL
  Filled 2014-03-23 (×4): qty 1

## 2014-03-23 MED ORDER — INSULIN GLARGINE 100 UNIT/ML ~~LOC~~ SOLN
5.0000 [IU] | Freq: Every day | SUBCUTANEOUS | Status: DC
  Administered 2014-03-23 – 2014-03-25 (×3): 5 [IU] via SUBCUTANEOUS
  Filled 2014-03-23 (×4): qty 0.05

## 2014-03-23 MED ORDER — ALBUTEROL SULFATE (2.5 MG/3ML) 0.083% IN NEBU: 2.5000 mg | INHALATION_SOLUTION | RESPIRATORY_TRACT | Status: DC | PRN

## 2014-03-23 MED ORDER — POTASSIUM CHLORIDE CRYS ER 20 MEQ PO TBCR
40.0000 meq | EXTENDED_RELEASE_TABLET | Freq: Once | ORAL | Status: AC
  Administered 2014-03-23: 40 meq via ORAL

## 2014-03-23 MED ORDER — LEVOFLOXACIN IN D5W 750 MG/150ML IV SOLN
INTRAVENOUS | Status: AC
  Administered 2014-03-23: 07:00:00
  Filled 2014-03-23: qty 150

## 2014-03-23 MED ORDER — ACETAMINOPHEN 325 MG PO TABS
650.0000 mg | ORAL_TABLET | Freq: Four times a day (QID) | ORAL | Status: DC | PRN
  Administered 2014-03-25 – 2014-03-27 (×3): 650 mg via ORAL
  Filled 2014-03-23 (×4): qty 2

## 2014-03-23 MED ORDER — OSELTAMIVIR PHOSPHATE 75 MG PO CAPS
75.0000 mg | ORAL_CAPSULE | Freq: Two times a day (BID) | ORAL | Status: DC
  Administered 2014-03-23: 75 mg via ORAL
  Filled 2014-03-23 (×2): qty 1

## 2014-03-23 MED ORDER — DIPHENHYDRAMINE HCL 12.5 MG/5ML PO ELIX
12.5000 mg | ORAL_SOLUTION | Freq: Three times a day (TID) | ORAL | Status: DC | PRN
  Administered 2014-03-23 – 2014-03-25 (×4): 12.5 mg via ORAL
  Filled 2014-03-23 (×7): qty 5

## 2014-03-23 MED ORDER — IBUPROFEN 600 MG PO TABS
600.0000 mg | ORAL_TABLET | Freq: Four times a day (QID) | ORAL | Status: DC | PRN
  Administered 2014-03-23 – 2014-03-27 (×8): 600 mg via ORAL
  Filled 2014-03-23 (×11): qty 1

## 2014-03-23 MED ORDER — MENTHOL 3 MG MT LOZG
1.0000 | LOZENGE | OROMUCOSAL | Status: DC | PRN
  Administered 2014-03-24: 3 mg via ORAL
  Filled 2014-03-23 (×2): qty 9
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
## 2014-03-23 NOTE — Progress Notes (Signed)
UR completed Estellar Cadena K. Markeis Allman, RN, BSN, MSHL, CCM  03/23/2014 11:06 AM

## 2014-03-23 NOTE — Progress Notes (Signed)
Pt. C/o headache and sore throat. Page sent to [facility], MD. RN will inform oncoming RN. Charnise Lovan, Katherine Roan

## 2014-03-23 NOTE — Progress Notes (Addendum)
Inpatient Diabetes Program Recommendations  AACE/ADA: New Consensus Statement on Inpatient Glycemic Control (2013)  Target Ranges:  Prepandial:   less than 140 mg/dL      Peak postprandial:   less than 180 mg/dL (1-2 hours)      Critically ill patients:  140 - 180 mg/dL   Reason for Assessment:  Results for Maria Fox, Maria Fox (MRN [id number]) as of 03/23/2014 11:38  Ref. Range 03/23/2014 06:54 03/23/2014 11:26  Glucose-Capillary Latest Range: 70-99 mg/dL 196 (H) 328 (H)   Diabetes history: Type 2  Outpatient Diabetes medications: None listed Current orders for Inpatient glycemic control:  Lantus 5 units daily, Novolog sensitive tid with meals  Please consider checking A1C to determine pre-hospitalization glycemic control.  This will help guide medication management for discharge.  Consider adding Novolog meal coverage 3 units tid with meals.  Thanks, Adah Perl, RN, BC-ADM Inpatient Diabetes Coordinator Pager [phone]

## 2014-03-23 NOTE — Progress Notes (Signed)
PROGRESS NOTE    Maria Fox:[id number] DOB: 12-28-1945 DOA: 03/23/2014 PCP: [name], MD  HPI/Brief narrative 69 y.o. female with past medical history of hypertension, hyperlipidemia, diabetes mellitus, depression, medication noncompliance, who presents with runny nose, sore throat, productive cough, whole body aching. In ED, chest x-ray is negative for acute abnormalities, no leukocytosis, temperature normal, potassium is 3.3. Suspicious for flu like illness.Has received flu shot this season.   Assessment/Plan:  1. Influenza like illness: FU flu panel PCR. Continue droplet isolation. Continue supportive/symptomatic Rx. 2. Acute Bronchitis: related to viral illness Vs bacterial. Received a dose of IV Levofloxacin in ED. Now on ZPak.  3. Hypokalemia: replace and FU. 4. Essential HTN: controlled. Continue Lisinopril. 5. Uncontrolled DM 2: Novolog SSI and low dose Lantus. 6. H/o HLD:   Code Status: Full Family Communication: Discussed with friend Mr. [name] at bedside. Disposition Plan: Home when improved.   Consultants:  None  Procedures:  None  Antibiotics:  IV Levofloxacin- one dose  ZPak   Subjective: Feels slightly better- HA is less. Has myalgia, runny nose and sore throat.  Objective: Filed Vitals:   03/23/14 0445 03/23/14 0500 03/23/14 0515 03/23/14 0557  BP: 101/87 121/66 132/64 143/79  Pulse: 119 113 112 107  Temp:    97.5 F (36.4 C)  TempSrc:    Oral  Resp:    22  Height:    5\' 3"  (1.6 m)  Weight:    67.813 kg (149 lb 8 oz)  SpO2: 94% 91% 97% 97%    Intake/Output Summary (Last 24 hours) at 03/23/14 0829 Last data filed at 03/23/14 0700  Gross per 24 hour  Intake    240 ml  Output    650 ml  Net   -410 ml   Filed Weights   03/23/14 0557  Weight: 67.813 kg (149 lb 8 oz)     Exam:  General exam: pleasant elderly lady lying comfortably in bed. Respiratory system: Clear. No increased work of breathing. Cardiovascular system:  S1 & S2 heard, RRR. No JVD, murmurs, gallops, clicks or pedal edema. Gastrointestinal system: Abdomen is nondistended, soft and nontender. Normal bowel sounds heard. Central nervous system: Alert and oriented. No focal neurological deficits. Extremities: Symmetric 5 x 5 power.   Data Reviewed: Basic Metabolic Panel:  Recent Labs Lab 03/23/14 0058  NA 132*  K 3.3*  CL 100  CO2 25  GLUCOSE 265*  BUN 8  CREATININE 0.89  CALCIUM 8.6   Liver Function Tests:  Recent Labs Lab 03/23/14 0058  AST 28  ALT 18  ALKPHOS 102  BILITOT 0.4  PROT 6.7  ALBUMIN 3.5   No results for input(s): LIPASE, AMYLASE in the last 168 hours. No results for input(s): AMMONIA in the last 168 hours. CBC:  Recent Labs Lab 03/23/14 0058  WBC 6.1  NEUTROABS 3.4  HGB 13.5  HCT 38.7  MCV 87.0  PLT 207   Cardiac Enzymes: No results for input(s): CKTOTAL, CKMB, CKMBINDEX, TROPONINI in the last 168 hours. BNP (last 3 results) No results for input(s): PROBNP in the last 8760 hours. CBG:  Recent Labs Lab 03/23/14 0654  GLUCAP 196*    No results found for this or any previous visit (from the past 240 hour(s)).        Studies: Dg Chest 2 View  03/22/2014   CLINICAL DATA:  Acute onset of cough, fever, chills and shortness of breath. Initial encounter.  EXAM: CHEST  2 VIEW  COMPARISON:  Chest radiograph from 04/18/2012  FINDINGS: The lungs are well-aerated. Minimal left basilar atelectasis is noted. There is no evidence of pleural effusion or pneumothorax.  The heart is normal in size; the mediastinal contour is within normal limits. No acute osseous abnormalities are seen.  IMPRESSION: Minimal left basilar atelectasis noted; lungs otherwise clear.   Electronically Signed   By: Garald Balding M.D.   On: 03/22/2014 23:55        Scheduled Meds: . azithromycin  500 mg Oral Daily   Followed by  . [START ON 03/24/2014] azithromycin  250 mg Oral Daily  . dextromethorphan-guaiFENesin  1 tablet  Oral BID  . insulin aspart  0-9 Units Subcutaneous TID WC  . insulin glargine  5 Units Subcutaneous Daily  . lisinopril  5 mg Oral Daily  . oseltamivir  75 mg Oral BID  . potassium chloride  40 mEq Oral Once   Continuous Infusions: . sodium chloride 75 mL/hr at 03/23/14 1610    Principal Problem:   URI (upper respiratory infection) Active Problems:   HYPERTENSION, BENIGN SYSTEMIC   HLD (hyperlipidemia)   Diabetes mellitus type 2, uncontrolled   Hypokalemia   Sepsis    Time spent: 25 minutes.    Vernell Leep, MD, FACP, FHM. [facility] Pager [phone]  If 7PM-7AM, please contact night-coverage [email or web address] Password [email or web address] 03/23/2014, 8:29 AM    LOS: 0 days

## 2014-03-24 ENCOUNTER — Inpatient Hospital Stay (HOSPITAL_COMMUNITY): Payer: Medicare Other

## 2014-03-24 ENCOUNTER — Telehealth: Payer: Self-pay

## 2014-03-24 DIAGNOSIS — J111 Influenza due to unidentified influenza virus with other respiratory manifestations: Principal | ICD-10-CM

## 2014-03-24 LAB — CBC
HCT: 38.7 % (ref 36.0–46.0)
HEMOGLOBIN: 13.6 g/dL (ref 12.0–15.0)
MCH: 30.6 pg (ref 26.0–34.0)
MCHC: 35.1 g/dL (ref 30.0–36.0)
MCV: 87.2 fL (ref 78.0–100.0)
PLATELETS: 187 10*3/uL (ref 150–400)
RBC: 4.44 MIL/uL (ref 3.87–5.11)
RDW: 12.6 % (ref 11.5–15.5)
WBC: 5 10*3/uL (ref 4.0–10.5)

## 2014-03-24 LAB — BASIC METABOLIC PANEL
Anion gap: 9 (ref 5–15)
BUN: 8 mg/dL (ref 6–23)
CALCIUM: 9 mg/dL (ref 8.4–10.5)
CO2: 24 mmol/L (ref 19–32)
Chloride: 107 mEq/L (ref 96–112)
Creatinine, Ser: 0.86 mg/dL (ref 0.50–1.10)
GFR calc Af Amer: 77 mL/min — ABNORMAL LOW (ref >90)
GFR calc non Af Amer: 66 mL/min — ABNORMAL LOW (ref >90)
Glucose, Bld: 176 mg/dL — ABNORMAL HIGH (ref 70–99)
Potassium: 4.2 mmol/L (ref 3.5–5.1)
SODIUM: 140 mmol/L (ref 135–145)

## 2014-03-24 LAB — GLUCOSE, CAPILLARY
GLUCOSE-CAPILLARY: 160 mg/dL — AB (ref 70–99)
GLUCOSE-CAPILLARY: 129 mg/dL — AB (ref 70–99)
Glucose-Capillary: 215 mg/dL — ABNORMAL HIGH (ref 70–99)
Glucose-Capillary: 286 mg/dL — ABNORMAL HIGH (ref 70–99)

## 2014-03-24 MED ORDER — OXYMETAZOLINE HCL 0.05 % NA SOLN
1.0000 | Freq: Two times a day (BID) | NASAL | Status: DC
  Administered 2014-03-24 – 2014-03-28 (×7): 1 via NASAL
  Filled 2014-03-24 (×2): qty 15

## 2014-03-24 NOTE — Telephone Encounter (Signed)
Pt request name of cream given in 2012; advised pt on hx med list hydrocortisone cream 1 % given at ED. Pt voiced understanding.

## 2014-03-24 NOTE — Progress Notes (Signed)
PROGRESS NOTE    TEALE GOODGAME BJY:[id number] DOB: 04/05/1945 DOA: 03/23/2014 PCP: [name], MD  HPI/Brief narrative 69 y.o. female with past medical history of hypertension, hyperlipidemia, diabetes mellitus, depression, medication noncompliance, who presents with runny nose, sore throat, productive cough, whole body aching. In ED, chest x-ray is negative for acute abnormalities, no leukocytosis, temperature normal, potassium is 3.3. Suspicious for flu like illness.Has received flu shot this season.   Assessment/Plan:  1. Influenza like illness: flu panel PCR negative. DC'd droplet isolation. Continue supportive/symptomatic Rx. Although patient states she does not feel a whole lot better, she does look better than yesterday. Afrin nasal spray. 2. Acute Bronchitis: related to viral illness Vs bacterial. Received a dose of IV Levofloxacin in ED. Now on ZPak. Repeat chest x-ray today to rule out pneumonia. 3. Hypokalemia: replaced. 4. Essential HTN: Reasonable inpatient control. Continue lisinopril 5. Uncontrolled DM 2: Novolog SSI and low dose Lantus. Fluctuating CBGs. 6. H/o HLD: 7. History of dermatitis: States that she used to follow-up with Dr. Lyman Speller at [facility] dermatology. Several tests including skin biopsy apparently were not conclusive. Has used some kind of steroid cream in the past. Usually has flareups of itching during acute episodes. When necessary Benadryl. Consider 1% hydrocortisone.   Code Status: Full Family Communication: Discussed with friend Mr. [name] at bedside. Disposition Plan: Home when improved-possibly 1/15.   Consultants:  None  Procedures:  None  Antibiotics:  IV Levofloxacin- one dose  ZPak   Subjective: Head feels congested/headache. Denies dyspnea. Nonproductive cough. Myalgia better. Has chills but no fever.  Objective: Filed Vitals:   03/23/14 0557 03/23/14 1400 03/23/14 2050 03/24/14 0701  BP: 143/79  148/66 151/76 141/83  Pulse: 107 102 92 103  Temp: 97.5 F (36.4 C) 97.4 F (36.3 C) 98.4 F (36.9 C) 97.8 F (36.6 C)  TempSrc: Oral Oral Oral Oral  Resp: 22 20 20 18   Height: 5\' 3"  (1.6 m)     Weight: 67.813 kg (149 lb 8 oz)   66.3 kg (146 lb 2.6 oz)  SpO2: 97% 96% 96% 96%    Intake/Output Summary (Last 24 hours) at 03/24/14 1341 Last data filed at 03/24/14 1300  Gross per 24 hour  Intake    435 ml  Output   3400 ml  Net  -2965 ml   Filed Weights   03/23/14 0557 03/24/14 0701  Weight: 67.813 kg (149 lb 8 oz) 66.3 kg (146 lb 2.6 oz)     Exam:  General exam: pleasant elderly lady lying comfortably in bed. Looks much improved compared to yesterday. Not toxic or septic looking. Neck supple. Respiratory system: Clear. No increased work of breathing. Cardiovascular system: S1 & S2 heard, RRR. No JVD, murmurs, gallops, clicks or pedal edema. Telemetry: Sinus rhythm. Gastrointestinal system: Abdomen is nondistended, soft and nontender. Normal bowel sounds heard. Central nervous system: Alert and oriented. No focal neurological deficits. Extremities: Symmetric 5 x 5 power. Skin: Several hypopigmented patches on the body. Has superficial excoriations right upper arm and legs at scratch sites.  Data Reviewed: Basic Metabolic Panel:  Recent Labs Lab 03/23/14 0058 03/24/14 0434  NA 132* 140  K 3.3* 4.2  CL 100 107  CO2 25 24  GLUCOSE 265* 176*  BUN 8 8  CREATININE 0.89 0.86  CALCIUM 8.6 9.0   Liver Function Tests:  Recent Labs Lab 03/23/14 0058  AST 28  ALT 18  ALKPHOS 102  BILITOT 0.4  PROT 6.7  ALBUMIN 3.5  No results for input(s): LIPASE, AMYLASE in the last 168 hours. No results for input(s): AMMONIA in the last 168 hours. CBC:  Recent Labs Lab 03/23/14 0058 03/24/14 0434  WBC 6.1 5.0  NEUTROABS 3.4  --   HGB 13.5 13.6  HCT 38.7 38.7  MCV 87.0 87.2  PLT 207 187   Cardiac Enzymes: No results for input(s): CKTOTAL, CKMB, CKMBINDEX, TROPONINI in  the last 168 hours. BNP (last 3 results) No results for input(s): PROBNP in the last 8760 hours. CBG:  Recent Labs Lab 03/23/14 1126 03/23/14 1617 03/23/14 2105 03/24/14 0708 03/24/14 1047  GLUCAP 328* 130* 195* 160* 215*    No results found for this or any previous visit (from the past 240 hour(s)).        Studies: Dg Chest 2 View  03/22/2014   CLINICAL DATA:  Acute onset of cough, fever, chills and shortness of breath. Initial encounter.  EXAM: CHEST  2 VIEW  COMPARISON:  Chest radiograph from 04/18/2012  FINDINGS: The lungs are well-aerated. Minimal left basilar atelectasis is noted. There is no evidence of pleural effusion or pneumothorax.  The heart is normal in size; the mediastinal contour is within normal limits. No acute osseous abnormalities are seen.  IMPRESSION: Minimal left basilar atelectasis noted; lungs otherwise clear.   Electronically Signed   By: Garald Balding M.D.   On: 03/22/2014 23:55        Scheduled Meds: . azithromycin  250 mg Oral Daily  . dextromethorphan-guaiFENesin  1 tablet Oral BID  . enoxaparin (LOVENOX) injection  40 mg Subcutaneous Q24H  . insulin aspart  0-9 Units Subcutaneous TID WC  . insulin glargine  5 Units Subcutaneous Daily  . lisinopril  5 mg Oral Daily  . oxymetazoline  1 spray Each Nare BID   Continuous Infusions: . sodium chloride 75 mL/hr at 03/23/14 2671    Principal Problem:   URI (upper respiratory infection) Active Problems:   HYPERTENSION, BENIGN SYSTEMIC   HLD (hyperlipidemia)   Diabetes mellitus type 2, uncontrolled   Hypokalemia   Sepsis   Influenza-like illness    Time spent: 25 minutes.    Vernell Leep, MD, FACP, FHM. [facility] Pager [phone]  If 7PM-7AM, please contact night-coverage [email or web address] Password [email or web address] 03/24/2014, 1:41 PM    LOS: 1 day

## 2014-03-25 DIAGNOSIS — I472 Ventricular tachycardia: Secondary | ICD-10-CM

## 2014-03-25 LAB — GLUCOSE, CAPILLARY
Glucose-Capillary: 198 mg/dL — ABNORMAL HIGH (ref 70–99)
Glucose-Capillary: 169 mg/dL — ABNORMAL HIGH (ref 70–99)
Glucose-Capillary: 193 mg/dL — ABNORMAL HIGH (ref 70–99)
Glucose-Capillary: 248 mg/dL — ABNORMAL HIGH (ref 70–99)

## 2014-03-25 LAB — MAGNESIUM: Magnesium: 1.7 mg/dL (ref 1.5–2.5)

## 2014-03-25 LAB — BASIC METABOLIC PANEL
Anion gap: 9 (ref 5–15)
BUN: 5 mg/dL — AB (ref 6–23)
CALCIUM: 8.4 mg/dL (ref 8.4–10.5)
CHLORIDE: 105 meq/L (ref 96–112)
CO2: 24 mmol/L (ref 19–32)
CREATININE: 0.8 mg/dL (ref 0.50–1.10)
GFR calc non Af Amer: 72 mL/min — ABNORMAL LOW (ref >90)
GFR, EST AFRICAN AMERICAN: 84 mL/min — AB (ref >90)
Glucose, Bld: 181 mg/dL — ABNORMAL HIGH (ref 70–99)
Potassium: 3.7 mmol/L (ref 3.5–5.1)
Sodium: 138 mmol/L (ref 135–145)

## 2014-03-25 LAB — TSH: TSH: 2.105 u[IU]/mL (ref 0.350–4.500)

## 2014-03-25 MED ORDER — POTASSIUM CHLORIDE IN NACL 40-0.9 MEQ/L-% IV SOLN
INTRAVENOUS | Status: DC
  Administered 2014-03-25: 75 mL/h via INTRAVENOUS
  Filled 2014-03-25 (×2): qty 1000

## 2014-03-25 MED ORDER — INSULIN ASPART 100 UNIT/ML ~~LOC~~ SOLN
0.0000 [IU] | Freq: Three times a day (TID) | SUBCUTANEOUS | Status: DC
Start: 2014-03-25 — End: 2014-03-28
  Administered 2014-03-25 (×3): 2 [IU] via SUBCUTANEOUS
  Administered 2014-03-26: 5 [IU] via SUBCUTANEOUS
  Administered 2014-03-26: 2 [IU] via SUBCUTANEOUS
  Administered 2014-03-26: 5 [IU] via SUBCUTANEOUS
  Administered 2014-03-27: 2 [IU] via SUBCUTANEOUS
  Administered 2014-03-27 – 2014-03-28 (×2): 5 [IU] via SUBCUTANEOUS
  Administered 2014-03-28: 2 [IU] via SUBCUTANEOUS

## 2014-03-25 MED ORDER — MAGNESIUM SULFATE 2 GM/50ML IV SOLN
2.0000 g | Freq: Once | INTRAVENOUS | Status: AC
  Administered 2014-03-25: 2 g via INTRAVENOUS
  Filled 2014-03-25: qty 50

## 2014-03-25 MED ORDER — LORATADINE 10 MG PO TABS
10.0000 mg | ORAL_TABLET | Freq: Every day | ORAL | Status: DC
  Administered 2014-03-25 – 2014-03-28 (×4): 10 mg via ORAL
  Filled 2014-03-25 (×4): qty 1

## 2014-03-25 MED ORDER — ONDANSETRON HCL 4 MG/2ML IJ SOLN
4.0000 mg | Freq: Four times a day (QID) | INTRAMUSCULAR | Status: DC | PRN
  Administered 2014-03-25 – 2014-03-27 (×3): 4 mg via INTRAVENOUS
  Filled 2014-03-25 (×3): qty 2

## 2014-03-25 MED ORDER — HYDROCORTISONE 1 % EX CREA
1.0000 "application " | TOPICAL_CREAM | Freq: Two times a day (BID) | CUTANEOUS | Status: DC
  Administered 2014-03-25 – 2014-03-28 (×6): 1 via TOPICAL
  Filled 2014-03-25: qty 28

## 2014-03-25 MED ORDER — LEVALBUTEROL HCL 0.63 MG/3ML IN NEBU: 0.6300 mg | INHALATION_SOLUTION | Freq: Four times a day (QID) | RESPIRATORY_TRACT | Status: DC | PRN

## 2014-03-25 MED ORDER — POTASSIUM CHLORIDE CRYS ER 20 MEQ PO TBCR
40.0000 meq | EXTENDED_RELEASE_TABLET | Freq: Once | ORAL | Status: AC
  Administered 2014-03-25: 40 meq via ORAL
  Filled 2014-03-25: qty 2

## 2014-03-25 NOTE — Progress Notes (Signed)
Inpatient Diabetes Program Recommendations  AACE/ADA: New Consensus Statement on Inpatient Glycemic Control (2013)  Target Ranges:  Prepandial:   less than 140 mg/dL      Peak postprandial:   less than 180 mg/dL (1-2 hours)      Critically ill patients:  140 - 180 mg/dL  Results for Maria Fox, Maria Fox (MRN [id number]) as of 03/25/2014 13:24  Ref. Range 03/24/2014 10:47 03/24/2014 17:12 03/24/2014 20:57 03/25/2014 06:20 03/25/2014 12:23  Glucose-Capillary Latest Range: 70-99 mg/dL 215 (H) 286 (H) 129 (H) 198 (H) 169 (H)   Inpatient Diabetes Program Recommendations Insulin - Basal: consider increasing Lantus to 7 units  HgbA1C: order to assess prehospital glucose control Thank you  Raoul Pitch BSN, RN,CDE Inpatient Diabetes Coordinator [phone] (team pager)

## 2014-03-25 NOTE — Progress Notes (Signed)
Pt.is A/Ox4 and is ambulatory with 1 person standby assist. Had c/o leg cramps and headache and po prn pain medication was given. She had a 17 beat run on VT this afternoon. She was  asymptomatic. VS taken and documented. Attending MD was paged and notified. New orders written. EKG was done this evening and attending MD was notified.

## 2014-03-25 NOTE — Progress Notes (Signed)
PROGRESS NOTE    Maria Fox DIY:[id number] DOB: July 06, 1945 DOA: 03/23/2014 PCP: [name], MD  HPI/Brief narrative 69 y.o. female with past medical history of hypertension, hyperlipidemia, diabetes mellitus, depression, medication noncompliance, who presents with runny nose, sore throat, productive cough, whole body aching. In ED, chest x-ray is negative for acute abnormalities, no leukocytosis, temperature normal, potassium is 3.3. Suspicious for flu like illness.Has received flu shot this season.   Assessment/Plan:  1. Influenza like illness: flu panel PCR negative. DC'd droplet isolation. Continue supportive/symptomatic Rx. Although patient states she does not feel a whole lot better, she does look better than yesterday. Afrin nasal spray. States feels 40% better. Add Claritin. Brief IVF 2. Acute Bronchitis: related to viral illness Vs bacterial. Received a dose of IV Levofloxacin in ED. Now on ZPak. Repeat chest x-ray Neg. 3. Hypokalemia: replaced. 4. Essential HTN: Reasonable inpatient control. Continue lisinopril 5. Uncontrolled DM 2: Novolog SSI and low dose Lantus. Fluctuating CBGs. 6. H/o HLD: 7. History of dermatitis: States that she used to follow-up with Dr. Lyman Speller at [facility] dermatology. Several tests including skin biopsy apparently were not conclusive. Has used some kind of steroid cream in the past. Usually has flareups of itching during acute episodes. When necessary Benadryl. Added 1% hydrocortisone. 8. NSVT: Patient had an episode of 7 beat NSVT and 17 beat NSVT. Asymptomatic. Continue telemetry. Keep potassium greater than 4, magnesium greater than 2. Check 2-D echo.   Code Status: Full Family Communication: Discussed with friend Mr. [name] at bedside. Disposition Plan: Home when medically stable   Consultants:  None  Procedures:  None  Antibiotics:  IV Levofloxacin- one dose  ZPak   Subjective: State she feels 40% better  but still has intermittent head congestion/headache mostly frontal. Sore throat has improved. Minimal dry cough. Leg cramps. No dyspnea.  Objective: Filed Vitals:   03/25/14 0525 03/25/14 1117 03/25/14 1427 03/25/14 1530  BP: 148/87 146/69 160/74 170/73  Pulse: 100 80 88 88  Temp: 97.7 F (36.5 C)  97.6 F (36.4 C) 97.5 F (36.4 C)  TempSrc: Oral  Oral Oral  Resp: 18  18 18   Height:      Weight: 65.8 kg (145 lb 1 oz)     SpO2: 96%  96% 96%    Intake/Output Summary (Last 24 hours) at 03/25/14 1751 Last data filed at 03/25/14 1413  Gross per 24 hour  Intake   1170 ml  Output   2150 ml  Net   -980 ml   Filed Weights   03/23/14 0557 03/24/14 0701 03/25/14 0525  Weight: 67.813 kg (149 lb 8 oz) 66.3 kg (146 lb 2.6 oz) 65.8 kg (145 lb 1 oz)     Exam:  General exam: pleasant elderly lady lying comfortably in bed. Not toxic or septic looking. Neck supple. Respiratory system: Clear. No increased work of breathing. Cardiovascular system: S1 & S2 heard, RRR. No JVD, murmurs, gallops, clicks or pedal edema. Telemetry: Sinus rhythm & NSVT as stated above. Gastrointestinal system: Abdomen is nondistended, soft and nontender. Normal bowel sounds heard. Central nervous system: Alert and oriented. No focal neurological deficits. Extremities: Symmetric 5 x 5 power. Skin: Several hypopigmented patches on the body. Has superficial excoriations right upper arm and legs at scratch sites.  Data Reviewed: Basic Metabolic Panel:  Recent Labs Lab 03/23/14 0058 03/24/14 0434 03/25/14 0721  NA 132* 140 138  K 3.3* 4.2 3.7  CL 100 107 105  CO2 25 24 24  GLUCOSE 265* 176* 181*  BUN 8 8 5*  CREATININE 0.89 0.86 0.80  CALCIUM 8.6 9.0 8.4  MG  --   --  1.7   Liver Function Tests:  Recent Labs Lab 03/23/14 0058  AST 28  ALT 18  ALKPHOS 102  BILITOT 0.4  PROT 6.7  ALBUMIN 3.5   No results for input(s): LIPASE, AMYLASE in the last 168 hours. No results for input(s): AMMONIA in the  last 168 hours. CBC:  Recent Labs Lab 03/23/14 0058 03/24/14 0434  WBC 6.1 5.0  NEUTROABS 3.4  --   HGB 13.5 13.6  HCT 38.7 38.7  MCV 87.0 87.2  PLT 207 187   Cardiac Enzymes: No results for input(s): CKTOTAL, CKMB, CKMBINDEX, TROPONINI in the last 168 hours. BNP (last 3 results) No results for input(s): PROBNP in the last 8760 hours. CBG:  Recent Labs Lab 03/24/14 1712 03/24/14 2057 03/25/14 0620 03/25/14 1223 03/25/14 1654  GLUCAP 286* 129* 198* 169* 193*    No results found for this or any previous visit (from the past 240 hour(s)).        Studies: Dg Chest 2 View  03/24/2014   CLINICAL DATA:  Persistent cough for 2 days. Headaches and weakness.  EXAM: CHEST  2 VIEW  COMPARISON:  03/22/2014  FINDINGS: The cardiomediastinal silhouette is within normal limits. The lungs are well inflated and clear. There is no evidence of pleural effusion or pneumothorax. No acute osseous abnormality is identified.  IMPRESSION: No active cardiopulmonary disease.   Electronically Signed   By: Logan Bores   On: 03/24/2014 17:35        Scheduled Meds: . azithromycin  250 mg Oral Daily  . dextromethorphan-guaiFENesin  1 tablet Oral BID  . enoxaparin (LOVENOX) injection  40 mg Subcutaneous Q24H  . hydrocortisone cream  1 application Topical BID  . insulin aspart  0-9 Units Subcutaneous TID WC  . insulin glargine  5 Units Subcutaneous Daily  . lisinopril  5 mg Oral Daily  . loratadine  10 mg Oral Daily  . oxymetazoline  1 spray Each Nare BID   Continuous Infusions: . sodium chloride Stopped (03/25/14 1239)  . 0.9 % NaCl with KCl 40 mEq / L 75 mL/hr (03/25/14 1238)    Principal Problem:   URI (upper respiratory infection) Active Problems:   HYPERTENSION, BENIGN SYSTEMIC   HLD (hyperlipidemia)   Diabetes mellitus type 2, uncontrolled   Hypokalemia   Sepsis   Influenza-like illness    Time spent: 25 minutes.    Vernell Leep, MD, FACP, FHM. [facility] Pager [phone]  If 7PM-7AM, please contact night-coverage [email or web address] Password [email or web address] 03/25/2014, 5:51 PM    LOS: 2 days

## 2014-03-26 LAB — MAGNESIUM: MAGNESIUM: 2 mg/dL (ref 1.5–2.5)

## 2014-03-26 LAB — GLUCOSE, CAPILLARY
GLUCOSE-CAPILLARY: 259 mg/dL — AB (ref 70–99)
Glucose-Capillary: 279 mg/dL — ABNORMAL HIGH (ref 70–99)
Glucose-Capillary: 153 mg/dL — ABNORMAL HIGH (ref 70–99)
Glucose-Capillary: 159 mg/dL — ABNORMAL HIGH (ref 70–99)

## 2014-03-26 LAB — BASIC METABOLIC PANEL
ANION GAP: 6 (ref 5–15)
BUN: 7 mg/dL (ref 6–23)
CO2: 25 mmol/L (ref 19–32)
CREATININE: 0.9 mg/dL (ref 0.50–1.10)
Calcium: 8.5 mg/dL (ref 8.4–10.5)
Chloride: 104 mEq/L (ref 96–112)
GFR, EST AFRICAN AMERICAN: 73 mL/min — AB (ref >90)
GFR, EST NON AFRICAN AMERICAN: 63 mL/min — AB (ref >90)
GLUCOSE: 258 mg/dL — AB (ref 70–99)
Potassium: 5.1 mmol/L (ref 3.5–5.1)
SODIUM: 135 mmol/L (ref 135–145)

## 2014-03-26 MED ORDER — INSULIN GLARGINE 100 UNIT/ML ~~LOC~~ SOLN
8.0000 [IU] | Freq: Every day | SUBCUTANEOUS | Status: DC
  Administered 2014-03-26 – 2014-03-28 (×3): 8 [IU] via SUBCUTANEOUS
  Filled 2014-03-26 (×3): qty 0.08

## 2014-03-26 MED ORDER — CARVEDILOL 3.125 MG PO TABS
3.1250 mg | ORAL_TABLET | Freq: Two times a day (BID) | ORAL | Status: DC
  Administered 2014-03-27 – 2014-03-28 (×3): 3.125 mg via ORAL
  Filled 2014-03-26 (×4): qty 1

## 2014-03-26 MED ORDER — PERFLUTREN LIPID MICROSPHERE
1.0000 mL | INTRAVENOUS | Status: AC | PRN
  Administered 2014-03-26: 2 mL via INTRAVENOUS
  Filled 2014-03-26: qty 10

## 2014-03-26 MED ORDER — LOSARTAN POTASSIUM 50 MG PO TABS
50.0000 mg | ORAL_TABLET | Freq: Every day | ORAL | Status: DC
  Administered 2014-03-27 – 2014-03-28 (×2): 50 mg via ORAL
  Filled 2014-03-26 (×2): qty 1
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
## 2014-03-26 NOTE — Progress Notes (Signed)
PROGRESS NOTE    Maria Fox GXQ:[id number] DOB: 11-05-1945 DOA: 03/23/2014 PCP: [name], MD  HPI/Brief narrative 69 y.o. female with past medical history of hypertension, hyperlipidemia, diabetes mellitus, depression, medication noncompliance, who presents with runny nose, sore throat, productive cough, whole body aching. In ED, chest x-ray is negative for acute abnormalities, no leukocytosis, temperature normal, potassium is 3.3. Suspicious for flu like illness.Has received flu shot this season.   Assessment/Plan:  1. Influenza like illness: flu panel PCR negative. DC'd droplet isolation. Continue supportive/symptomatic Rx. Although patient states she does not feel a whole lot better, she does look better than yesterday. Afrin nasal spray. Added Claritin. Feels much better today. 2. Acute Bronchitis: related to viral illness Vs bacterial. Received a dose of IV Levofloxacin in ED. Now on ZPak. Repeat chest x-ray Neg. Improving. 3. Hypokalemia: replaced. 4. Essential HTN: Reasonable inpatient control. Continue lisinopril 5. Uncontrolled DM 2: Novolog SSI and low dose Lantus-increase dose. Uncontrolled CBGs. 6. H/o HLD: 7. History of dermatitis: States that she used to follow-up with Dr. Lyman Speller at [facility] dermatology. Several tests including skin biopsy apparently were not conclusive. Has used some kind of steroid cream in the past. Usually has flareups of itching during acute episodes. When necessary Benadryl. Added 1% hydrocortisone. 8. NSVT: Patient had an episode of 7 beat NSVT and 17 beat NSVT. Asymptomatic. Continue telemetry. Keep potassium greater than 4, magnesium greater than 2. Check 2-D echo. Cardiology consulted and plan stress test 1/17.   Code Status: Full Family Communication: Discussed with friend Mr. [name] at bedside. Disposition Plan: Home possibly 1/17 post stress test if  negative   Consultants:  None  Procedures:  None  Antibiotics:  IV Levofloxacin- one dose  ZPak   Subjective: Feels much better. Headache almost resolved. Decreasing leg cramps. No cough or dyspnea.  Objective: Filed Vitals:   03/26/14 0514 03/26/14 1359 03/26/14 1555 03/26/14 1617  BP: 152/79 146/78 144/76 152/79  Pulse: 79 87  84  Temp: 97.9 F (36.6 C) 97.3 F (36.3 C)    TempSrc: Oral Oral    Resp: 18 20    Height:      Weight: 64.501 kg (142 lb 3.2 oz)     SpO2: 98% 96%  98%    Intake/Output Summary (Last 24 hours) at 03/26/14 1719 Last data filed at 03/26/14 1659  Gross per 24 hour  Intake 2412.5 ml  Output   3450 ml  Net -1037.5 ml   Filed Weights   03/24/14 0701 03/25/14 0525 03/26/14 0514  Weight: 66.3 kg (146 lb 2.6 oz) 65.8 kg (145 lb 1 oz) 64.501 kg (142 lb 3.2 oz)     Exam:  General exam: pleasant elderly lady lying comfortably in bed. Not toxic or septic looking. Neck supple. Respiratory system: Clear. No increased work of breathing. Cardiovascular system: S1 & S2 heard, RRR. No JVD, murmurs, gallops, clicks or pedal edema. Telemetry: Sinus rhythm & no further episodes of NSVT.  Gastrointestinal system: Abdomen is nondistended, soft and nontender. Normal bowel sounds heard. Central nervous system: Alert and oriented. No focal neurological deficits. Extremities: Symmetric 5 x 5 power. Skin: Several hypopigmented patches on the body. Has superficial excoriations right upper arm and legs at scratch sites.  Data Reviewed: Basic Metabolic Panel:  Recent Labs Lab 03/23/14 0058 03/24/14 0434 03/25/14 0721 03/26/14 0451  NA 132* 140 138 135  K 3.3* 4.2 3.7 5.1  CL 100 107 105 104  CO2 25 24 24  25  GLUCOSE 265* 176* 181* 258*  BUN 8 8 5* 7  CREATININE 0.89 0.86 0.80 0.90  CALCIUM 8.6 9.0 8.4 8.5  MG  --   --  1.7 2.0   Liver Function Tests:  Recent Labs Lab 03/23/14 0058  AST 28  ALT 18  ALKPHOS 102  BILITOT 0.4  PROT 6.7   ALBUMIN 3.5   No results for input(s): LIPASE, AMYLASE in the last 168 hours. No results for input(s): AMMONIA in the last 168 hours. CBC:  Recent Labs Lab 03/23/14 0058 03/24/14 0434  WBC 6.1 5.0  NEUTROABS 3.4  --   HGB 13.5 13.6  HCT 38.7 38.7  MCV 87.0 87.2  PLT 207 187   Cardiac Enzymes: No results for input(s): CKTOTAL, CKMB, CKMBINDEX, TROPONINI in the last 168 hours. BNP (last 3 results) No results for input(s): PROBNP in the last 8760 hours. CBG:  Recent Labs Lab 03/25/14 1654 03/25/14 2054 03/26/14 0634 03/26/14 1107 03/26/14 1607  GLUCAP 193* 248* 279* 259* 153*    No results found for this or any previous visit (from the past 240 hour(s)).        Studies: Dg Chest 2 View  03/24/2014   CLINICAL DATA:  Persistent cough for 2 days. Headaches and weakness.  EXAM: CHEST  2 VIEW  COMPARISON:  03/22/2014  FINDINGS: The cardiomediastinal silhouette is within normal limits. The lungs are well inflated and clear. There is no evidence of pleural effusion or pneumothorax. No acute osseous abnormality is identified.  IMPRESSION: No active cardiopulmonary disease.   Electronically Signed   By: Logan Bores   On: 03/24/2014 17:35        Scheduled Meds: . azithromycin  250 mg Oral Daily  . [START ON 03/27/2014] carvedilol  3.125 mg Oral BID WC  . dextromethorphan-guaiFENesin  1 tablet Oral BID  . enoxaparin (LOVENOX) injection  40 mg Subcutaneous Q24H  . hydrocortisone cream  1 application Topical BID  . insulin aspart  0-9 Units Subcutaneous TID WC  . insulin glargine  8 Units Subcutaneous Daily  . loratadine  10 mg Oral Daily  . [START ON 03/27/2014] losartan  50 mg Oral Daily  . oxymetazoline  1 spray Each Nare BID   Continuous Infusions:    Principal Problem:   URI (upper respiratory infection) Active Problems:   HYPERTENSION, BENIGN SYSTEMIC   HLD (hyperlipidemia)   Diabetes mellitus type 2, uncontrolled   Hypokalemia   Sepsis   Influenza-like  illness    Time spent: 25 minutes.    Vernell Leep, MD, FACP, FHM. [facility] Pager [phone]  If 7PM-7AM, please contact night-coverage [email or web address] Password [email or web address] 03/26/2014, 5:19 PM    LOS: 3 days

## 2014-03-26 NOTE — Progress Notes (Signed)
  Echocardiogram 2D Echocardiogram has been performed.  Darlina Sicilian M 03/26/2014, 4:14 PM

## 2014-03-27 ENCOUNTER — Inpatient Hospital Stay (HOSPITAL_COMMUNITY): Payer: Medicare Other

## 2014-03-27 LAB — BASIC METABOLIC PANEL
Anion gap: 3 — ABNORMAL LOW (ref 5–15)
BUN: 14 mg/dL (ref 6–23)
CALCIUM: 8.9 mg/dL (ref 8.4–10.5)
CHLORIDE: 105 meq/L (ref 96–112)
CO2: 26 mmol/L (ref 19–32)
Creatinine, Ser: 0.95 mg/dL (ref 0.50–1.10)
GFR calc Af Amer: 68 mL/min — ABNORMAL LOW (ref >90)
GFR calc non Af Amer: 59 mL/min — ABNORMAL LOW (ref >90)
Glucose, Bld: 214 mg/dL — ABNORMAL HIGH (ref 70–99)
POTASSIUM: 4.1 mmol/L (ref 3.5–5.1)
Sodium: 134 mmol/L — ABNORMAL LOW (ref 135–145)

## 2014-03-27 LAB — CK: Total CK: 60 U/L (ref 7–177)

## 2014-03-27 LAB — GLUCOSE, CAPILLARY
GLUCOSE-CAPILLARY: 209 mg/dL — AB (ref 70–99)
Glucose-Capillary: 194 mg/dL — ABNORMAL HIGH (ref 70–99)
Glucose-Capillary: 195 mg/dL — ABNORMAL HIGH (ref 70–99)
Glucose-Capillary: 265 mg/dL — ABNORMAL HIGH (ref 70–99)

## 2014-03-27 MED ORDER — REGADENOSON 0.4 MG/5ML IV SOLN
0.4000 mg | Freq: Once | INTRAVENOUS | Status: AC
  Administered 2014-03-27: 0.4 mg via INTRAVENOUS
  Filled 2014-03-27: qty 5

## 2014-03-27 MED ORDER — PANTOPRAZOLE SODIUM 40 MG PO TBEC
40.0000 mg | DELAYED_RELEASE_TABLET | Freq: Every day | ORAL | Status: DC
Start: 2014-03-27 — End: 2014-03-28
  Administered 2014-03-27 – 2014-03-28 (×2): 40 mg via ORAL
  Filled 2014-03-27 (×2): qty 1

## 2014-03-27 MED ORDER — CYCLOBENZAPRINE HCL 10 MG PO TABS
5.0000 mg | ORAL_TABLET | Freq: Three times a day (TID) | ORAL | Status: DC | PRN
  Administered 2014-03-27: 5 mg via ORAL
  Filled 2014-03-27: qty 1

## 2014-03-27 MED ORDER — MORPHINE SULFATE 2 MG/ML IJ SOLN: 1.0000 mg | INTRAMUSCULAR | Status: DC | PRN

## 2014-03-27 MED ORDER — ALUM & MAG HYDROXIDE-SIMETH 200-200-20 MG/5ML PO SUSP: 30.0000 mL | Freq: Four times a day (QID) | ORAL | Status: DC | PRN

## 2014-03-27 MED ORDER — TECHNETIUM TC 99M SESTAMIBI GENERIC - CARDIOLITE
10.0000 | Freq: Once | INTRAVENOUS | Status: AC | PRN
  Administered 2014-03-27: 10 via INTRAVENOUS

## 2014-03-27 MED ORDER — TECHNETIUM TC 99M SESTAMIBI GENERIC - CARDIOLITE
30.0000 | Freq: Once | INTRAVENOUS | Status: AC | PRN
  Administered 2014-03-27: 30 via INTRAVENOUS

## 2014-03-27 MED ORDER — REGADENOSON 0.4 MG/5ML IV SOLN
INTRAVENOUS | Status: AC
  Filled 2014-03-27: qty 5

## 2014-03-27 NOTE — Progress Notes (Signed)
PROGRESS NOTE    TYESE FINKEN HQI:[id number] DOB: [birth year] DOA: 03/23/2014 PCP: [name], MD  HPI/Brief narrative 69 y.o. female with past medical history of hypertension, hyperlipidemia, diabetes mellitus, depression, medication noncompliance, who presents with runny nose, sore throat, productive cough, whole body aching. In ED, chest x-ray is negative for acute abnormalities, no leukocytosis, temperature normal, potassium is 3.3. Suspicious for flu like illness.Has received flu shot this season.   Assessment/Plan:  1. Influenza like illness: flu panel PCR negative. Patient had gradually improved. Today she returned from stress test and states that she is severely nauseous, feels dizzy on standing and continues to have intermittent headache. Will get CT head/evaluate sinuses. Continue Afrin nasal spray and Claritin. Will check orthostatic blood pressures. Unclear regarding protracted symptoms/course. 2. Acute Bronchitis: related to viral illness Vs bacterial. Received a dose of IV Levofloxacin in ED. Repeat chest x-ray Neg. completed Z-Pak. Cough almost resolved. 3. Hypokalemia: replaced. 4. Essential HTN: Reasonable inpatient control. Antihypertensive change to ARB and carvedilol. 5. Uncontrolled DM 2: Novolog SSI and low dose Lantus-increase dose. CBGs better. Check A1c. States that she hasn't taken her metformin for greater than a year. Resume metformin at discharge. 6. H/o HLD: 7. History of dermatitis: States that she used to follow-up with Dr. Lyman Speller at [facility] dermatology. Several tests including skin biopsy apparently were not conclusive. Has used some kind of steroid cream in the past. Usually has flareups of itching during acute episodes. When necessary Benadryl. Added 1% hydrocortisone. 8. NSVT: Patient had an episode of 7 beat NSVT and 17 beat NSVT 1/15. Asymptomatic. Continue telemetry. Keep potassium greater than 4, magnesium greater than 2. 2-D  echo: Normal EF. Cardiology consulted and stress test 1/17 shows normal myocardial perfusion but LVEF 45%. Low risk stress test findings. Discussed with cardiology who recommended continuing ARB, carvedilol and outpatient follow-up with him in 1 week to determine further course of management.   Code Status: Full Family Communication: None at bedside. Disposition Plan: Home possibly 1/18   Consultants:  Cardiology  Procedures:  None  Antibiotics:  IV Levofloxacin- one dose  ZPak   Subjective: Returned from stress test complaining of feeling nauseous, dizzy/"drunk" on standing and continued intermittent headache and occasional lower extremity cramps.  Objective: Filed Vitals:   03/27/14 1029 03/27/14 1031 03/27/14 1033 03/27/14 1359  BP: 180/98 179/90  178/87  Pulse: 108 100 99 83  Temp:    97.4 F (36.3 C)  TempSrc:    Oral  Resp:    20  Height:      Weight:      SpO2:    95%    Intake/Output Summary (Last 24 hours) at 03/27/14 1504 Last data filed at 03/27/14 1300  Gross per 24 hour  Intake    580 ml  Output    802 ml  Net   -222 ml   Filed Weights   03/25/14 0525 03/26/14 0514 03/27/14 0525  Weight: 65.8 kg (145 lb 1 oz) 64.501 kg (142 lb 3.2 oz) 65.046 kg (143 lb 6.4 oz)     Exam:  General exam: pleasant elderly lady lying comfortably in bed. Not toxic or septic looking. Neck supple. Respiratory system: Clear. No increased work of breathing. Cardiovascular system: S1 & S2 heard, RRR. No JVD, murmurs, gallops, clicks or pedal edema. Telemetry: Sinus rhythm & no further episodes of NSVT.  Gastrointestinal system: Abdomen is nondistended, soft and nontender. Normal bowel sounds heard. Central nervous system: Alert and oriented.  No focal neurological deficits. Extremities: Symmetric 5 x 5 power. Skin: Several hypopigmented patches on the body. Has superficial excoriations right upper arm and legs at scratch sites.  Data Reviewed: Basic Metabolic  Panel:  Recent Labs Lab 03/23/14 0058 03/24/14 0434 03/25/14 0721 03/26/14 0451 03/27/14 0421  NA 132* 140 138 135 134*  K 3.3* 4.2 3.7 5.1 4.1  CL 100 107 105 104 105  CO2 25 24 24 25 26   GLUCOSE 265* 176* 181* 258* 214*  BUN 8 8 5* 7 14  CREATININE 0.89 0.86 0.80 0.90 0.95  CALCIUM 8.6 9.0 8.4 8.5 8.9  MG  --   --  1.7 2.0  --    Liver Function Tests:  Recent Labs Lab 03/23/14 0058  AST 28  ALT 18  ALKPHOS 102  BILITOT 0.4  PROT 6.7  ALBUMIN 3.5   No results for input(s): LIPASE, AMYLASE in the last 168 hours. No results for input(s): AMMONIA in the last 168 hours. CBC:  Recent Labs Lab 03/23/14 0058 03/24/14 0434  WBC 6.1 5.0  NEUTROABS 3.4  --   HGB 13.5 13.6  HCT 38.7 38.7  MCV 87.0 87.2  PLT 207 187   Cardiac Enzymes: No results for input(s): CKTOTAL, CKMB, CKMBINDEX, TROPONINI in the last 168 hours. BNP (last 3 results) No results for input(s): PROBNP in the last 8760 hours. CBG:  Recent Labs Lab 03/26/14 1107 03/26/14 1607 03/26/14 2051 03/27/14 0621 03/27/14 1202  GLUCAP 259* 153* 159* 194* 195*    No results found for this or any previous visit (from the past 240 hour(s)).        Studies: Nm Myocar Multi W/spect W/wall Motion / Ef  03/27/2014   CLINICAL DATA:  Chest pain, diabetes, hypertension, hyperlipidemia  EXAM: MYOCARDIAL IMAGING WITH SPECT (REST AND PHARMACOLOGIC-STRESS)  GATED LEFT VENTRICULAR WALL MOTION STUDY  LEFT VENTRICULAR EJECTION FRACTION  TECHNIQUE: Standard myocardial SPECT imaging was performed after resting intravenous injection of 10 mCi Tc-60m sestamibi. Subsequently, intravenous infusion of Lexiscan was performed under the supervision of the Cardiology staff. At peak effect of the drug, 30 mCi Tc-32m sestamibi was injected intravenously and standard myocardial SPECT imaging was performed. Quantitative gated imaging was also performed to evaluate left ventricular wall motion, and estimate left ventricular ejection  fraction.  COMPARISON:  None  FINDINGS: Perfusion: Normal myocardial perfusion on SPECT images obtained following pharmacologic stress. Resting exam unchanged. No pulmonary uptake of tracer.  Wall Motion: No focal LEFT ventricular wall motion abnormalities. No left ventricular dilation.  Left Ventricular Ejection Fraction: 45 %  End diastolic volume 41 ml  End systolic volume 23 ml  IMPRESSION: 1. Normal myocardial perfusion.  2. Normal left ventricular wall motion.  3. Left ventricular ejection fraction 45%  4. Low-risk stress test findings*.  *2012 Appropriate Use Criteria for Coronary Revascularization Focused Update: J Am Coll Cardiol. 8453;64(6):803-212. http://content.airportbarriers.com.aspx?articleid=1201161   Electronically Signed   By: Lavonia Dana M.D.   On: 03/27/2014 14:34        Scheduled Meds: . carvedilol  3.125 mg Oral BID WC  . dextromethorphan-guaiFENesin  1 tablet Oral BID  . enoxaparin (LOVENOX) injection  40 mg Subcutaneous Q24H  . hydrocortisone cream  1 application Topical BID  . insulin aspart  0-9 Units Subcutaneous TID WC  . insulin glargine  8 Units Subcutaneous Daily  . loratadine  10 mg Oral Daily  . losartan  50 mg Oral Daily  . oxymetazoline  1 spray Each Nare BID  .  regadenoson       Continuous Infusions:    Principal Problem:   URI (upper respiratory infection) Active Problems:   HYPERTENSION, BENIGN SYSTEMIC   HLD (hyperlipidemia)   Diabetes mellitus type 2, uncontrolled   Hypokalemia   Sepsis   Influenza-like illness    Time spent: 25 minutes.    Vernell Leep, MD, FACP, FHM. [facility] Pager [phone]  If 7PM-7AM, please contact night-coverage [email or web address] Password [email or web address] 03/27/2014, 3:04 PM    LOS: 4 days

## 2014-03-27 NOTE — Consult Note (Signed)
Ref: [name], MD   Subjective:  Passed nuclear stress test. Some nausea and dizziness. Afebrile.  Objective:  Vital Signs in the last 24 hours: Temp:  [97.4 F (36.3 C)-98 F (36.7 C)] 97.4 F (36.3 C) (01/17 1359) Pulse Rate:  [73-119] 83 (01/17 1359) Cardiac Rhythm:  [-] Normal sinus rhythm (01/17 0828) Resp:  [18-20] 20 (01/17 1359) BP: (134-181)/(66-98) 178/87 mmHg (01/17 1359) SpO2:  [95 %-100 %] 95 % (01/17 1359) Weight:  [65.046 kg (143 lb 6.4 oz)] 65.046 kg (143 lb 6.4 oz) (01/17 0525)  Physical Exam: BP Readings from Last 1 Encounters:  03/27/14 178/87    Wt Readings from Last 1 Encounters:  03/27/14 65.046 kg (143 lb 6.4 oz)    Weight change: 0.544 kg (1 lb 3.2 oz)  HEENT: [place]/AT, Eyes- PERL, EOMI, Conjunctiva-Pink, Sclera-Non-icteric Neck: No JVD, No bruit, Trachea midline. Lungs:  Clear, Bilateral. Cardiac:  Regular rhythm, normal S1 and S2, no S3.  Abdomen:  Soft, non-tender. Extremities:  No edema present. No cyanosis. No clubbing. CNS: AxOx3, Cranial nerves grossly intact, moves all 4 extremities. Right handed. Skin: Warm and dry.   Intake/Output from previous day: 01/16 0701 - 01/17 0700 In: 2156.3 [P.O.:1180; I.V.:976.3] Out: 1650 [Urine:1650]    Lab Results: BMET    Component Value Date/Time   NA 134* 03/27/2014 0421   NA 135 03/26/2014 0451   NA 138 03/25/2014 0721   K 4.1 03/27/2014 0421   K 5.1 03/26/2014 0451   K 3.7 03/25/2014 0721   CL 105 03/27/2014 0421   CL 104 03/26/2014 0451   CL 105 03/25/2014 0721   CO2 26 03/27/2014 0421   CO2 25 03/26/2014 0451   CO2 24 03/25/2014 0721   GLUCOSE 214* 03/27/2014 0421   GLUCOSE 258* 03/26/2014 0451   GLUCOSE 181* 03/25/2014 0721   BUN 14 03/27/2014 0421   BUN 7 03/26/2014 0451   BUN 5* 03/25/2014 0721   CREATININE 0.95 03/27/2014 0421   CREATININE 0.90 03/26/2014 0451   CREATININE 0.80 03/25/2014 0721   CALCIUM 8.9 03/27/2014 0421   CALCIUM 8.5 03/26/2014 0451   CALCIUM 8.4  03/25/2014 0721   GFRNONAA 59* 03/27/2014 0421   GFRNONAA 63* 03/26/2014 0451   GFRNONAA 72* 03/25/2014 0721   GFRAA 68* 03/27/2014 0421   GFRAA 73* 03/26/2014 0451   GFRAA 84* 03/25/2014 0721   CBC    Component Value Date/Time   WBC 5.0 03/24/2014 0434   RBC 4.44 03/24/2014 0434   HGB 13.6 03/24/2014 0434   HCT 38.7 03/24/2014 0434   PLT 187 03/24/2014 0434   MCV 87.2 03/24/2014 0434   MCH 30.6 03/24/2014 0434   MCHC 35.1 03/24/2014 0434   RDW 12.6 03/24/2014 0434   LYMPHSABS 1.8 03/23/2014 0058   MONOABS 0.6 03/23/2014 0058   EOSABS 0.3 03/23/2014 0058   BASOSABS 0.1 03/23/2014 0058   HEPATIC Function Panel  Recent Labs  03/23/14 0058  PROT 6.7   HEMOGLOBIN A1C No components found for: HGA1C,  MPG CARDIAC ENZYMES Lab Results  Component Value Date   CKTOTAL 97 11/30/2010   CKMB 3.2 11/30/2010   BNP No results for input(s): PROBNP in the last 8760 hours. TSH  Recent Labs  03/25/14 1830  TSH 2.105   CHOLESTEROL No results for input(s): CHOL in the last 8760 hours.  Scheduled Meds: . carvedilol  3.125 mg Oral BID WC  . dextromethorphan-guaiFENesin  1 tablet Oral BID  . enoxaparin (LOVENOX) injection  40 mg Subcutaneous Q24H  .  hydrocortisone cream  1 application Topical BID  . insulin aspart  0-9 Units Subcutaneous TID WC  . insulin glargine  8 Units Subcutaneous Daily  . loratadine  10 mg Oral Daily  . losartan  50 mg Oral Daily  . oxymetazoline  1 spray Each Nare BID  . pantoprazole  40 mg Oral Daily  . regadenoson       Continuous Infusions:  PRN Meds:.acetaminophen, alum & mag hydroxide-simeth, cyclobenzaprine, diphenhydrAMINE, ibuprofen, levalbuterol, menthol-cetylpyridinium, morphine injection, ondansetron  Assessment/Plan: Non-sustained VT Hypokalemia-resolved Hypertension DM, II Acute bronchitis Dizziness  Medical treatment. F/U in office in 1 week.    LOS: 4 days    Dixie Dials  MD  03/27/2014, 4:43 PM

## 2014-03-28 ENCOUNTER — Encounter: Payer: Self-pay | Admitting: Family Medicine

## 2014-03-28 ENCOUNTER — Telehealth: Payer: Self-pay | Admitting: Family Medicine

## 2014-03-28 LAB — GLUCOSE, CAPILLARY
GLUCOSE-CAPILLARY: 164 mg/dL — AB (ref 70–99)
Glucose-Capillary: 292 mg/dL — ABNORMAL HIGH (ref 70–99)
Glucose-Capillary: 121 mg/dL — ABNORMAL HIGH (ref 70–99)

## 2014-03-28 LAB — HEMOGLOBIN A1C
Hgb A1c MFr Bld: 11.2 % — ABNORMAL HIGH (ref <5.7)
MEAN PLASMA GLUCOSE: 275 mg/dL — AB (ref <117)

## 2014-03-28 MED ORDER — PANTOPRAZOLE SODIUM 40 MG PO TBEC: 40.0000 mg | DELAYED_RELEASE_TABLET | Freq: Every day | ORAL | Status: DC

## 2014-03-28 MED ORDER — HYDROCORTISONE 1 % EX CREA: 1.0000 "application " | TOPICAL_CREAM | Freq: Two times a day (BID) | CUTANEOUS | Status: DC

## 2014-03-28 MED ORDER — CARVEDILOL 3.125 MG PO TABS: 3.1250 mg | ORAL_TABLET | Freq: Two times a day (BID) | ORAL | Status: DC

## 2014-03-28 MED ORDER — LOSARTAN POTASSIUM 50 MG PO TABS: 50.0000 mg | ORAL_TABLET | Freq: Every day | ORAL | Status: DC

## 2014-03-28 MED ORDER — METFORMIN HCL 500 MG PO TABS: 500.0000 mg | ORAL_TABLET | Freq: Two times a day (BID) | ORAL | Status: DC

## 2014-03-28 MED ORDER — GABAPENTIN 100 MG PO CAPS: 100.0000 mg | ORAL_CAPSULE | Freq: Three times a day (TID) | ORAL | Status: DC

## 2014-03-28 MED ORDER — LORATADINE 10 MG PO TABS: 10.0000 mg | ORAL_TABLET | Freq: Every day | ORAL | Status: DC | PRN

## 2014-03-28 MED ORDER — FLUTICASONE PROPIONATE 50 MCG/ACT NA SUSP: 2.0000 | Freq: Every day | NASAL | Status: DC

## 2014-03-28 MED ORDER — ONDANSETRON HCL 4 MG PO TABS: 4.0000 mg | ORAL_TABLET | Freq: Three times a day (TID) | ORAL | Status: DC | PRN

## 2014-03-28 MED ORDER — IBUPROFEN 200 MG PO TABS: 600.0000 mg | ORAL_TABLET | Freq: Three times a day (TID) | ORAL | Status: DC | PRN

## 2014-03-28 NOTE — Progress Notes (Signed)
UR completed Rayley Gao K. Zenith Kercheval, RN, BSN, [place], CCM  03/28/2014 3:54 PM

## 2014-03-28 NOTE — Telephone Encounter (Signed)
Plz call for hosp f/u visit and schedule f/u visit in 1-2 wks - admitted 1/13-18/2016 for flu like illness and uncontrolled diabetes.

## 2014-03-28 NOTE — Care Management Note (Addendum)
  Page 1 of 1   03/28/2014     3:58:19 PM CARE MANAGEMENT NOTE 03/28/2014  Patient:  Maria Fox, Maria Fox   Account Number:  [id number]  Date Initiated:  03/28/2014  Documentation initiated by:  Inna Tisdell  Subjective/Objective Assessment:   Flu Symptoms, viral syndrome, URI, HR 100s, A1c 11     Action/Plan:   CM to follow for disposition needs   Anticipated DC Date:  03/28/2014   Anticipated DC Plan:  HOME/SELF CARE         Choice offered to / List presented to:             Status of service:  Completed, signed off Medicare Important Message given?  YES (If response is "NO", the following Medicare IM given date fields will be blank) Date Medicare IM given:  03/28/2014 Medicare IM given by:  Kile Kabler Date Additional Medicare IM given:   Additional Medicare IM given by:    Discharge Disposition:  HOME/SELF CARE  Per UR Regulation:  Reviewed for med. necessity/level of care/duration of stay  If discussed at [place] of Stay Meetings, dates discussed:   03/29/2014    Comments:  Mariann Laster RN, BSN, [place], CCM  Nurse - Case Manager,  (Unit [facility]) [phone]  03/28/2014

## 2014-03-28 NOTE — Discharge Summary (Signed)
Physician Discharge Summary  CHAELA BRANSCUM EZM:[id number] DOB: Jul 20, 1945 DOA: 03/23/2014  PCP: [name], MD  Admit date: 03/23/2014 Discharge date: 03/28/2014  Time spent: Less than 30 minutes  Recommendations for Outpatient Follow-up:  1. Dr. [name], PCP in 3 days. 2. Dr. Dixie Dials, Cardiology in 1 week.  Discharge Diagnoses:  Principal Problem:   URI (upper respiratory infection) Active Problems:   HYPERTENSION, BENIGN SYSTEMIC   HLD (hyperlipidemia)   Diabetes mellitus type 2, uncontrolled   Hypokalemia   Sepsis   Influenza-like illness   Discharge Condition: Improved & Stable  Diet recommendation: Heart healthy & Diabetic diet.  Filed Weights   03/26/14 0514 03/27/14 0525 03/28/14 0648  Weight: 64.501 kg (142 lb 3.2 oz) 65.046 kg (143 lb 6.4 oz) 63.957 kg (141 lb)    History of present illness:  69 y.o. female with past medical history of hypertension, hyperlipidemia, diabetes mellitus, depression, medication noncompliance, who presented with runny nose, sore throat, productive cough, whole body aching. In ED, chest x-ray is negative for acute abnormalities, no leukocytosis, temperature normal, potassium is 3.3. Suspicious for flu like illness.Has received flu shot this season.  Hospital Course:   1. Influenza like illness: flu panel PCR negative. Patient has gradually improved with supportive treatment. She has mild intermittent right-sided headache which seems chronic. CT head without acute findings. Continue supportive care with PRN NSAIDs, Claritin, Flonase. 2. Acute Bronchitis: related to viral illness Vs bacterial. Received a dose of IV Levofloxacin in ED. Repeat chest x-ray Neg. completed Z-Pak. Cough almost resolved. 3. Hypokalemia: replaced. 4. Essential HTN: Antihypertensive changed to ARB and carvedilol. Blood pressures mildly uncontrolled and fluctuating. Outpatient close follow-up and titration as needed. 5. Uncontrolled DM 2: States that  she hasn't taken her metformin for greater than a year. Hemoglobin A1c >11. In the hospital she was treated with Lantus and NovoLog SSI. Ideally would have discharged on insulins but strongly suspect noncompliance and hence will resume Metformin which she was on before. Patient states allergic to sulfa's. Recommended regular CBG checks, diabetic diet, medication compliance and close follow-up with PCP to adjust medications as needed. She verbalized understanding.  6. H/o HLD: 7. History of dermatitis: States that she used to follow-up with Dr. Lyman Speller at [facility] dermatology. Several tests including skin biopsy apparently were not conclusive. Has used some kind of steroid cream in the past. Usually has flareups of itching during acute episodes. Added 1% hydrocortisone. Improved. 8. NSVT: Patient had an episode of 7 beat NSVT and 17 beat NSVT 1/15. Asymptomatic. 2-D echo: Normal EF. Cardiology consulted and stress test 1/17 shows normal myocardial perfusion but LVEF 45%. Low risk stress test findings. Discussed with cardiology who recommended continuing ARB, carvedilol and outpatient follow-up with him in 1 week to determine further course of management. 9. Chronic headache: Outpatient evaluation and management as deemed necessary. CT head without acute findings. Neurontin being initiated for peripheral neuropathy should help some. 10. Lower extremity cramps/? Peripheral neuropathy: Patient states that she used to be on Neurontin but stopped taking it along with other medications for no particular reason. Resume low-dose Neurontin and follow-up outpatient.  Consultations:  Cardiology  Procedures:  None    Discharge Exam:  Complaints:  Feels much better than she did last evening. Chronic intermittent right-sided headache for the last 6 months-had worsened on admission-has improved. No further nausea. No vomiting. Dizziness resolved. Leg cramps improved.  Filed Vitals:    03/27/14 1359 03/27/14 2205 03/28/14 0205 03/28/14 5035  BP: 178/87 148/72 168/86 134/70  Pulse: 83 84 88 92  Temp: 97.4 F (36.3 C) 97.8 F (36.6 C) 97.9 F (36.6 C) 98.1 F (36.7 C)  TempSrc: Oral Oral Oral Oral  Resp: 20 20 20 20   Height:      Weight:    63.957 kg (141 lb)  SpO2: 95% 97% 98% 95%    General exam: pleasant elderly lady lying comfortably in bed. Not toxic or septic looking. Neck supple. Respiratory system: Clear. No increased work of breathing. Cardiovascular system: S1 & S2 heard, RRR. No JVD, murmurs, gallops, clicks or pedal edema. Telemetry: Sinus rhythm & no further episodes of NSVT.  Gastrointestinal system: Abdomen is nondistended, soft and nontender. Normal bowel sounds heard. Central nervous system: Alert and oriented. No focal neurological deficits. Extremities: Symmetric 5 x 5 power. Skin: Several hypopigmented patches on the body. Has superficial excoriations right upper arm and legs at scratch sites.  Discharge Instructions      Discharge Instructions    Call MD for:  difficulty breathing, headache or visual disturbances    Complete by:  As directed      Call MD for:  extreme fatigue    Complete by:  As directed      Call MD for:  persistant nausea and vomiting    Complete by:  As directed      Call MD for:  severe uncontrolled pain    Complete by:  As directed      Call MD for:  temperature >100.4    Complete by:  As directed      Diet - low sodium heart healthy    Complete by:  As directed      Diet Carb Modified    Complete by:  As directed      Increase activity slowly    Complete by:  As directed             Medication List    STOP taking these medications        diazepam 5 MG tablet  Commonly known as:  VALIUM      TAKE these medications        carvedilol 3.125 MG tablet  Commonly known as:  COREG  Take 1 tablet (3.125 mg total) by mouth 2 (two) times daily with a meal.     EPIPEN 0.3 mg/0.3 mL Soaj injection  Generic  drug:  EPINEPHrine  Inject 0.3 mg into the muscle once.     fluticasone 50 MCG/ACT nasal spray  Commonly known as:  FLONASE  Place 2 sprays into both nostrils daily.     gabapentin 100 MG capsule  Commonly known as:  NEURONTIN  Take 1 capsule (100 mg total) by mouth 3 (three) times daily.     hydrocortisone cream 1 %  Apply 1 application topically 2 (two) times daily.     ibuprofen 200 MG tablet  Commonly known as:  ADVIL,MOTRIN  Take 3 tablets (600 mg total) by mouth every 8 (eight) hours as needed for fever or headache (Pain.).     loratadine 10 MG tablet  Commonly known as:  CLARITIN  Take 1 tablet (10 mg total) by mouth daily as needed for allergies or rhinitis.     losartan 50 MG tablet  Commonly known as:  COZAAR  Take 1 tablet (50 mg total) by mouth daily.     metFORMIN 500 MG tablet  Commonly known as:  GLUCOPHAGE  Take 1 tablet (500 mg total)  by mouth 2 (two) times daily with a meal.     ondansetron 4 MG tablet  Commonly known as:  ZOFRAN  Take 1 tablet (4 mg total) by mouth every 8 (eight) hours as needed for nausea or vomiting.     pantoprazole 40 MG tablet  Commonly known as:  PROTONIX  Take 1 tablet (40 mg total) by mouth daily.       Follow-up Information    Follow up with [facility], MD. Schedule an appointment as soon as possible for a visit in 1 week.   Specialty:  Cardiology   Why:  follow up heart rhythm   Contact information:   [place] [address] [phone]       Follow up with [name], MD. Schedule an appointment as soon as possible for a visit in 3 days.   Specialty:  Family Medicine   Why:  to manage and adjust diabetes medications and post hospital discharge follow up.   Contact information:   [place] [address] [phone]        The results of significant diagnostics from this hospitalization (including imaging, microbiology, ancillary and laboratory) are listed  below for reference.    Significant Diagnostic Studies: Dg Chest 2 View  03/24/2014   CLINICAL DATA:  Persistent cough for 2 days. Headaches and weakness.  EXAM: CHEST  2 VIEW  COMPARISON:  03/22/2014  FINDINGS: The cardiomediastinal silhouette is within normal limits. The lungs are well inflated and clear. There is no evidence of pleural effusion or pneumothorax. No acute osseous abnormality is identified.  IMPRESSION: No active cardiopulmonary disease.   Electronically Signed   By: Logan Bores   On: 03/24/2014 17:35   Dg Chest 2 View  03/22/2014   CLINICAL DATA:  Acute onset of cough, fever, chills and shortness of breath. Initial encounter.  EXAM: CHEST  2 VIEW  COMPARISON:  Chest radiograph from 04/18/2012  FINDINGS: The lungs are well-aerated. Minimal left basilar atelectasis is noted. There is no evidence of pleural effusion or pneumothorax.  The heart is normal in size; the mediastinal contour is within normal limits. No acute osseous abnormalities are seen.  IMPRESSION: Minimal left basilar atelectasis noted; lungs otherwise clear.   Electronically Signed   By: Garald Balding M.D.   On: 03/22/2014 23:55   Ct Head Wo Contrast  03/27/2014   CLINICAL DATA:  Headache and cold symptoms.  Possible sinus disease.  EXAM: CT HEAD WITHOUT CONTRAST  TECHNIQUE: Contiguous axial images were obtained from the base of the skull through the vertex without intravenous contrast.  COMPARISON:  Head CT scan 07/07/2009.  FINDINGS: Mild atrophy and chronic microvascular ischemic change are identified. There is no evidence of acute intracranial abnormality including hemorrhage, infarct, mass lesion, mass effect, midline shift or abnormal extra-axial fluid collection.  The maxillary sinuses are incompletely visualized. Mild appearing circumferential mucosal thickening is seen in the right maxillary sinus. Very mild ethmoid air cell disease is identified. Visualized paranasal sinuses are otherwise clear.  IMPRESSION: No  acute abnormality.  Mild circumferential mucosal thickening right maxillary sinus.  Atrophy and chronic microvascular ischemic change.   Electronically Signed   By: Inge Rise M.D.   On: 03/27/2014 18:11   Nm Myocar Multi W/spect W/wall Motion / Ef  03/27/2014   CLINICAL DATA:  Chest pain, diabetes, hypertension, hyperlipidemia  EXAM: MYOCARDIAL IMAGING WITH SPECT (REST AND PHARMACOLOGIC-STRESS)  GATED LEFT VENTRICULAR WALL MOTION STUDY  LEFT  VENTRICULAR EJECTION FRACTION  TECHNIQUE: Standard myocardial SPECT imaging was performed after resting intravenous injection of 10 mCi Tc-50m sestamibi. Subsequently, intravenous infusion of Lexiscan was performed under the supervision of the Cardiology staff. At peak effect of the drug, 30 mCi Tc-73m sestamibi was injected intravenously and standard myocardial SPECT imaging was performed. Quantitative gated imaging was also performed to evaluate left ventricular wall motion, and estimate left ventricular ejection fraction.  COMPARISON:  None  FINDINGS: Perfusion: Normal myocardial perfusion on SPECT images obtained following pharmacologic stress. Resting exam unchanged. No pulmonary uptake of tracer.  Wall Motion: No focal LEFT ventricular wall motion abnormalities. No left ventricular dilation.  Left Ventricular Ejection Fraction: 45 %  End diastolic volume 41 ml  End systolic volume 23 ml  IMPRESSION: 1. Normal myocardial perfusion.  2. Normal left ventricular wall motion.  3. Left ventricular ejection fraction 45%  4. Low-risk stress test findings*.  *2012 Appropriate Use Criteria for Coronary Revascularization Focused Update: J Am Coll Cardiol. 6378;58(8):502-774. http://content.airportbarriers.com.aspx?articleid=1201161   Electronically Signed   By: Lavonia Dana M.D.   On: 03/27/2014 14:34    Microbiology: No results found for this or any previous visit (from the past 240 hour(s)).   Labs: Basic Metabolic Panel:  Recent Labs Lab 03/23/14 0058  03/24/14 0434 03/25/14 0721 03/26/14 0451 03/27/14 0421  NA 132* 140 138 135 134*  K 3.3* 4.2 3.7 5.1 4.1  CL 100 107 105 104 105  CO2 25 24 24 25 26   GLUCOSE 265* 176* 181* 258* 214*  BUN 8 8 5* 7 14  CREATININE 0.89 0.86 0.80 0.90 0.95  CALCIUM 8.6 9.0 8.4 8.5 8.9  MG  --   --  1.7 2.0  --    Liver Function Tests:  Recent Labs Lab 03/23/14 0058  AST 28  ALT 18  ALKPHOS 102  BILITOT 0.4  PROT 6.7  ALBUMIN 3.5   No results for input(s): LIPASE, AMYLASE in the last 168 hours. No results for input(s): AMMONIA in the last 168 hours. CBC:  Recent Labs Lab 03/23/14 0058 03/24/14 0434  WBC 6.1 5.0  NEUTROABS 3.4  --   HGB 13.5 13.6  HCT 38.7 38.7  MCV 87.0 87.2  PLT 207 187   Cardiac Enzymes:  Recent Labs Lab 03/27/14 1634  CKTOTAL 60   BNP: BNP (last 3 results) No results for input(s): PROBNP in the last 8760 hours. CBG:  Recent Labs Lab 03/27/14 1202 03/27/14 1602 03/27/14 2103 03/28/14 0657 03/28/14 1117  GLUCAP 195* 265* 209* 164* 292*     Additional labs: 1. Hemoglobin A1c: 11.2 2. TSH: 2.105 3. Influenza panel PCR: Negative 4. 2-D echo 03/26/14: Study Conclusions  - Left ventricle: The cavity size was normal. There was mild concentric hypertrophy. Systolic function was vigorous. The estimated ejection fraction was in the range of 65% to 70%. Doppler parameters are consistent with abnormal left ventricular relaxation (grade 1 diastolic dysfunction).   Signed:  Vernell Leep, MD, FACP, FHM. [facility] Pager [phone]  If 7PM-7AM, please contact night-coverage [email or web address] Password [email or web address] 03/28/2014, 2:01 PM

## 2014-03-28 NOTE — Discharge Instructions (Signed)

## 2014-03-29 NOTE — Telephone Encounter (Signed)
Message left for patient to return my call.  

## 2014-03-30 NOTE — Telephone Encounter (Signed)
Message left for patient to return my call.  

## 2014-03-31 ENCOUNTER — Ambulatory Visit: Payer: Medicare Other | Admitting: Family Medicine

## 2014-03-31 NOTE — Telephone Encounter (Signed)
Message left for patient to return my call. Already has follow up scheduled.

## 2014-04-07 ENCOUNTER — Ambulatory Visit: Payer: Medicare Other | Admitting: Family Medicine

## 2014-04-15 ENCOUNTER — Ambulatory Visit: Payer: Medicare Other | Admitting: Family Medicine

## 2014-04-29 ENCOUNTER — Ambulatory Visit: Payer: Medicare Other | Admitting: Family Medicine

## 2014-06-10 DIAGNOSIS — N390 Urinary tract infection, site not specified: Secondary | ICD-10-CM

## 2014-06-10 DIAGNOSIS — J189 Pneumonia, unspecified organism: Secondary | ICD-10-CM

## 2014-06-10 DIAGNOSIS — I2510 Atherosclerotic heart disease of native coronary artery without angina pectoris: Secondary | ICD-10-CM

## 2014-06-10 HISTORY — DX: Urinary tract infection, site not specified: N39.0

## 2014-06-10 HISTORY — PX: CORONARY ARTERY BYPASS GRAFT: SHX141

## 2014-06-10 HISTORY — DX: Atherosclerotic heart disease of native coronary artery without angina pectoris: I25.10

## 2014-06-10 HISTORY — DX: Pneumonia, unspecified organism: J18.9

## 2014-06-14 DIAGNOSIS — R110 Nausea: Secondary | ICD-10-CM | POA: Diagnosis not present

## 2014-06-14 DIAGNOSIS — Z951 Presence of aortocoronary bypass graft: Secondary | ICD-10-CM | POA: Diagnosis not present

## 2014-06-14 DIAGNOSIS — E1165 Type 2 diabetes mellitus with hyperglycemia: Secondary | ICD-10-CM | POA: Diagnosis present

## 2014-06-14 DIAGNOSIS — Z87891 Personal history of nicotine dependence: Secondary | ICD-10-CM | POA: Diagnosis not present

## 2014-06-14 DIAGNOSIS — Z880 Allergy status to penicillin: Secondary | ICD-10-CM | POA: Diagnosis not present

## 2014-06-14 DIAGNOSIS — M359 Systemic involvement of connective tissue, unspecified: Secondary | ICD-10-CM | POA: Diagnosis present

## 2014-06-14 DIAGNOSIS — R5381 Other malaise: Secondary | ICD-10-CM | POA: Diagnosis not present

## 2014-06-14 DIAGNOSIS — I951 Orthostatic hypotension: Secondary | ICD-10-CM | POA: Diagnosis not present

## 2014-06-14 DIAGNOSIS — Z4682 Encounter for fitting and adjustment of non-vascular catheter: Secondary | ICD-10-CM | POA: Diagnosis not present

## 2014-06-14 DIAGNOSIS — J90 Pleural effusion, not elsewhere classified: Secondary | ICD-10-CM | POA: Diagnosis not present

## 2014-06-14 DIAGNOSIS — R918 Other nonspecific abnormal finding of lung field: Secondary | ICD-10-CM | POA: Diagnosis not present

## 2014-06-14 DIAGNOSIS — Z48812 Encounter for surgical aftercare following surgery on the circulatory system: Secondary | ICD-10-CM | POA: Diagnosis not present

## 2014-06-14 DIAGNOSIS — D62 Acute posthemorrhagic anemia: Secondary | ICD-10-CM | POA: Diagnosis not present

## 2014-06-14 DIAGNOSIS — D649 Anemia, unspecified: Secondary | ICD-10-CM | POA: Diagnosis present

## 2014-06-14 DIAGNOSIS — I25811 Atherosclerosis of native coronary artery of transplanted heart without angina pectoris: Secondary | ICD-10-CM | POA: Diagnosis present

## 2014-06-14 DIAGNOSIS — E119 Type 2 diabetes mellitus without complications: Secondary | ICD-10-CM | POA: Diagnosis not present

## 2014-06-14 DIAGNOSIS — I6312 Cerebral infarction due to embolism of basilar artery: Secondary | ICD-10-CM | POA: Diagnosis not present

## 2014-06-14 DIAGNOSIS — I214 Non-ST elevation (NSTEMI) myocardial infarction: Secondary | ICD-10-CM | POA: Diagnosis not present

## 2014-06-14 DIAGNOSIS — Z01818 Encounter for other preprocedural examination: Secondary | ICD-10-CM | POA: Diagnosis not present

## 2014-06-14 DIAGNOSIS — D696 Thrombocytopenia, unspecified: Secondary | ICD-10-CM | POA: Diagnosis not present

## 2014-06-14 DIAGNOSIS — E785 Hyperlipidemia, unspecified: Secondary | ICD-10-CM | POA: Diagnosis not present

## 2014-06-14 DIAGNOSIS — L80 Vitiligo: Secondary | ICD-10-CM | POA: Diagnosis present

## 2014-06-14 DIAGNOSIS — I2510 Atherosclerotic heart disease of native coronary artery without angina pectoris: Secondary | ICD-10-CM | POA: Diagnosis not present

## 2014-06-14 DIAGNOSIS — E1142 Type 2 diabetes mellitus with diabetic polyneuropathy: Secondary | ICD-10-CM | POA: Diagnosis present

## 2014-06-14 DIAGNOSIS — R079 Chest pain, unspecified: Secondary | ICD-10-CM | POA: Diagnosis not present

## 2014-06-14 DIAGNOSIS — Z01810 Encounter for preprocedural cardiovascular examination: Secondary | ICD-10-CM | POA: Diagnosis not present

## 2014-06-14 DIAGNOSIS — R0789 Other chest pain: Secondary | ICD-10-CM | POA: Diagnosis not present

## 2014-06-14 DIAGNOSIS — I10 Essential (primary) hypertension: Secondary | ICD-10-CM | POA: Diagnosis not present

## 2014-06-14 DIAGNOSIS — Z882 Allergy status to sulfonamides status: Secondary | ICD-10-CM | POA: Diagnosis not present

## 2014-06-14 DIAGNOSIS — M358 Other specified systemic involvement of connective tissue: Secondary | ICD-10-CM | POA: Diagnosis present

## 2014-06-14 DIAGNOSIS — I249 Acute ischemic heart disease, unspecified: Secondary | ICD-10-CM | POA: Diagnosis present

## 2014-06-14 DIAGNOSIS — Z794 Long term (current) use of insulin: Secondary | ICD-10-CM | POA: Diagnosis not present

## 2014-06-22 DIAGNOSIS — E119 Type 2 diabetes mellitus without complications: Secondary | ICD-10-CM | POA: Diagnosis not present

## 2014-06-22 DIAGNOSIS — R5381 Other malaise: Secondary | ICD-10-CM | POA: Diagnosis not present

## 2014-06-22 DIAGNOSIS — E1165 Type 2 diabetes mellitus with hyperglycemia: Secondary | ICD-10-CM | POA: Diagnosis not present

## 2014-06-22 DIAGNOSIS — Z87891 Personal history of nicotine dependence: Secondary | ICD-10-CM | POA: Diagnosis not present

## 2014-06-22 DIAGNOSIS — I639 Cerebral infarction, unspecified: Secondary | ICD-10-CM | POA: Diagnosis not present

## 2014-06-22 DIAGNOSIS — R110 Nausea: Secondary | ICD-10-CM | POA: Diagnosis not present

## 2014-06-22 DIAGNOSIS — I2510 Atherosclerotic heart disease of native coronary artery without angina pectoris: Secondary | ICD-10-CM | POA: Diagnosis not present

## 2014-06-22 DIAGNOSIS — E785 Hyperlipidemia, unspecified: Secondary | ICD-10-CM | POA: Diagnosis not present

## 2014-06-22 DIAGNOSIS — D696 Thrombocytopenia, unspecified: Secondary | ICD-10-CM | POA: Diagnosis present

## 2014-06-22 DIAGNOSIS — I951 Orthostatic hypotension: Secondary | ICD-10-CM | POA: Diagnosis not present

## 2014-06-22 DIAGNOSIS — L80 Vitiligo: Secondary | ICD-10-CM | POA: Diagnosis present

## 2014-06-22 DIAGNOSIS — M359 Systemic involvement of connective tissue, unspecified: Secondary | ICD-10-CM | POA: Diagnosis present

## 2014-06-22 DIAGNOSIS — E139 Other specified diabetes mellitus without complications: Secondary | ICD-10-CM | POA: Diagnosis not present

## 2014-06-22 DIAGNOSIS — D649 Anemia, unspecified: Secondary | ICD-10-CM | POA: Diagnosis present

## 2014-06-22 DIAGNOSIS — R4182 Altered mental status, unspecified: Secondary | ICD-10-CM | POA: Diagnosis not present

## 2014-06-22 DIAGNOSIS — Z794 Long term (current) use of insulin: Secondary | ICD-10-CM | POA: Diagnosis not present

## 2014-06-22 DIAGNOSIS — I214 Non-ST elevation (NSTEMI) myocardial infarction: Secondary | ICD-10-CM | POA: Diagnosis not present

## 2014-06-22 DIAGNOSIS — R42 Dizziness and giddiness: Secondary | ICD-10-CM | POA: Diagnosis not present

## 2014-06-22 DIAGNOSIS — Z951 Presence of aortocoronary bypass graft: Secondary | ICD-10-CM | POA: Diagnosis not present

## 2014-06-22 DIAGNOSIS — I10 Essential (primary) hypertension: Secondary | ICD-10-CM | POA: Diagnosis not present

## 2014-06-22 DIAGNOSIS — I6312 Cerebral infarction due to embolism of basilar artery: Secondary | ICD-10-CM | POA: Diagnosis not present

## 2014-06-22 DIAGNOSIS — Q211 Atrial septal defect: Secondary | ICD-10-CM | POA: Diagnosis not present

## 2014-06-22 DIAGNOSIS — I25811 Atherosclerosis of native coronary artery of transplanted heart without angina pectoris: Secondary | ICD-10-CM | POA: Diagnosis present

## 2014-06-29 DIAGNOSIS — I214 Non-ST elevation (NSTEMI) myocardial infarction: Secondary | ICD-10-CM | POA: Diagnosis not present

## 2014-07-02 DIAGNOSIS — Q211 Atrial septal defect: Secondary | ICD-10-CM | POA: Diagnosis not present

## 2014-07-07 ENCOUNTER — Inpatient Hospital Stay (HOSPITAL_COMMUNITY)
Admission: EM | Admit: 2014-07-07 | Discharge: 2014-07-18 | DRG: 073 | Disposition: A | Discharge status: Home / Self Care | Payer: Medicare Other | Attending: Internal Medicine | Admitting: Internal Medicine

## 2014-07-07 ENCOUNTER — Encounter (HOSPITAL_COMMUNITY): Payer: Self-pay | Admitting: *Deleted

## 2014-07-07 ENCOUNTER — Emergency Department (HOSPITAL_COMMUNITY): Payer: Medicare Other

## 2014-07-07 ENCOUNTER — Other Ambulatory Visit (HOSPITAL_COMMUNITY): Payer: Self-pay

## 2014-07-07 DIAGNOSIS — L80 Vitiligo: Secondary | ICD-10-CM | POA: Diagnosis present

## 2014-07-07 DIAGNOSIS — I252 Old myocardial infarction: Secondary | ICD-10-CM

## 2014-07-07 DIAGNOSIS — E1143 Type 2 diabetes mellitus with diabetic autonomic (poly)neuropathy: Principal | ICD-10-CM | POA: Diagnosis present

## 2014-07-07 DIAGNOSIS — R531 Weakness: Secondary | ICD-10-CM

## 2014-07-07 DIAGNOSIS — I480 Paroxysmal atrial fibrillation: Secondary | ICD-10-CM | POA: Diagnosis present

## 2014-07-07 DIAGNOSIS — R197 Diarrhea, unspecified: Secondary | ICD-10-CM | POA: Diagnosis not present

## 2014-07-07 DIAGNOSIS — I472 Ventricular tachycardia: Secondary | ICD-10-CM | POA: Diagnosis not present

## 2014-07-07 DIAGNOSIS — K3184 Gastroparesis: Secondary | ICD-10-CM | POA: Diagnosis present

## 2014-07-07 DIAGNOSIS — E876 Hypokalemia: Secondary | ICD-10-CM | POA: Diagnosis not present

## 2014-07-07 DIAGNOSIS — R42 Dizziness and giddiness: Secondary | ICD-10-CM

## 2014-07-07 DIAGNOSIS — B961 Klebsiella pneumoniae [K. pneumoniae] as the cause of diseases classified elsewhere: Secondary | ICD-10-CM | POA: Diagnosis present

## 2014-07-07 DIAGNOSIS — Z8673 Personal history of transient ischemic attack (TIA), and cerebral infarction without residual deficits: Secondary | ICD-10-CM | POA: Diagnosis present

## 2014-07-07 DIAGNOSIS — Z885 Allergy status to narcotic agent status: Secondary | ICD-10-CM

## 2014-07-07 DIAGNOSIS — J90 Pleural effusion, not elsewhere classified: Secondary | ICD-10-CM

## 2014-07-07 DIAGNOSIS — E1149 Type 2 diabetes mellitus with other diabetic neurological complication: Secondary | ICD-10-CM | POA: Diagnosis present

## 2014-07-07 DIAGNOSIS — T500X5A Adverse effect of mineralocorticoids and their antagonists, initial encounter: Secondary | ICD-10-CM | POA: Diagnosis present

## 2014-07-07 DIAGNOSIS — E785 Hyperlipidemia, unspecified: Secondary | ICD-10-CM | POA: Diagnosis present

## 2014-07-07 DIAGNOSIS — Z8249 Family history of ischemic heart disease and other diseases of the circulatory system: Secondary | ICD-10-CM

## 2014-07-07 DIAGNOSIS — I2510 Atherosclerotic heart disease of native coronary artery without angina pectoris: Secondary | ICD-10-CM | POA: Diagnosis present

## 2014-07-07 DIAGNOSIS — I951 Orthostatic hypotension: Secondary | ICD-10-CM | POA: Insufficient documentation

## 2014-07-07 DIAGNOSIS — I4891 Unspecified atrial fibrillation: Secondary | ICD-10-CM | POA: Diagnosis not present

## 2014-07-07 DIAGNOSIS — I639 Cerebral infarction, unspecified: Secondary | ICD-10-CM | POA: Diagnosis not present

## 2014-07-07 DIAGNOSIS — K5900 Constipation, unspecified: Secondary | ICD-10-CM

## 2014-07-07 DIAGNOSIS — E860 Dehydration: Secondary | ICD-10-CM | POA: Diagnosis not present

## 2014-07-07 DIAGNOSIS — N390 Urinary tract infection, site not specified: Secondary | ICD-10-CM | POA: Diagnosis not present

## 2014-07-07 DIAGNOSIS — Z880 Allergy status to penicillin: Secondary | ICD-10-CM

## 2014-07-07 DIAGNOSIS — J189 Pneumonia, unspecified organism: Secondary | ICD-10-CM | POA: Diagnosis not present

## 2014-07-07 DIAGNOSIS — Z882 Allergy status to sulfonamides status: Secondary | ICD-10-CM

## 2014-07-07 DIAGNOSIS — E861 Hypovolemia: Secondary | ICD-10-CM | POA: Diagnosis present

## 2014-07-07 DIAGNOSIS — R112 Nausea with vomiting, unspecified: Secondary | ICD-10-CM

## 2014-07-07 DIAGNOSIS — Z7902 Long term (current) use of antithrombotics/antiplatelets: Secondary | ICD-10-CM

## 2014-07-07 DIAGNOSIS — Z7982 Long term (current) use of aspirin: Secondary | ICD-10-CM

## 2014-07-07 DIAGNOSIS — I10 Essential (primary) hypertension: Secondary | ICD-10-CM | POA: Diagnosis present

## 2014-07-07 DIAGNOSIS — R110 Nausea: Secondary | ICD-10-CM

## 2014-07-07 DIAGNOSIS — Z951 Presence of aortocoronary bypass graft: Secondary | ICD-10-CM

## 2014-07-07 DIAGNOSIS — A419 Sepsis, unspecified organism: Secondary | ICD-10-CM | POA: Diagnosis present

## 2014-07-07 DIAGNOSIS — E1165 Type 2 diabetes mellitus with hyperglycemia: Secondary | ICD-10-CM | POA: Diagnosis not present

## 2014-07-07 DIAGNOSIS — Y95 Nosocomial condition: Secondary | ICD-10-CM | POA: Diagnosis not present

## 2014-07-07 DIAGNOSIS — Z9889 Other specified postprocedural states: Secondary | ICD-10-CM | POA: Diagnosis not present

## 2014-07-07 DIAGNOSIS — Z833 Family history of diabetes mellitus: Secondary | ICD-10-CM

## 2014-07-07 DIAGNOSIS — Z7951 Long term (current) use of inhaled steroids: Secondary | ICD-10-CM

## 2014-07-07 HISTORY — DX: Urinary tract infection, site not specified: N39.0

## 2014-07-07 LAB — BASIC METABOLIC PANEL
Anion gap: 16 — ABNORMAL HIGH (ref 5–15)
BUN: 15 mg/dL (ref 6–23)
CALCIUM: 9.8 mg/dL (ref 8.4–10.5)
CO2: 18 mmol/L — AB (ref 19–32)
CREATININE: 1.2 mg/dL — AB (ref 0.50–1.10)
Chloride: 103 mmol/L (ref 96–112)
GFR calc Af Amer: 51 mL/min — ABNORMAL LOW (ref >90)
GFR calc non Af Amer: 44 mL/min — ABNORMAL LOW (ref >90)
Glucose, Bld: 195 mg/dL — ABNORMAL HIGH (ref 70–99)
Potassium: 4.2 mmol/L (ref 3.5–5.1)
SODIUM: 137 mmol/L (ref 135–145)

## 2014-07-07 LAB — CBC
HCT: 37.4 % (ref 36.0–46.0)
Hemoglobin: 13.2 g/dL (ref 12.0–15.0)
MCH: 31 pg (ref 26.0–34.0)
MCHC: 35.3 g/dL (ref 30.0–36.0)
MCV: 87.8 fL (ref 78.0–100.0)
Platelets: 458 10*3/uL — ABNORMAL HIGH (ref 150–400)
RBC: 4.26 MIL/uL (ref 3.87–5.11)
RDW: 14 % (ref 11.5–15.5)
WBC: 12.8 10*3/uL — ABNORMAL HIGH (ref 4.0–10.5)

## 2014-07-07 LAB — I-STAT TROPONIN, ED: TROPONIN I, POC: 0.01 ng/mL (ref 0.00–0.08)

## 2014-07-07 LAB — I-STAT CG4 LACTIC ACID, ED: Lactic Acid, Venous: 2.76 mmol/L (ref 0.5–2.0)

## 2014-07-07 LAB — BRAIN NATRIURETIC PEPTIDE: B NATRIURETIC PEPTIDE 5: 125.5 pg/mL — AB (ref 0.0–100.0)

## 2014-07-07 MED ORDER — SODIUM CHLORIDE 0.9 % IV BOLUS (SEPSIS)
500.0000 mL | Freq: Once | INTRAVENOUS | Status: AC
Start: 2014-07-07 — End: 2014-07-07
  Administered 2014-07-07: 500 mL via INTRAVENOUS

## 2014-07-07 MED ORDER — ONDANSETRON HCL 4 MG/2ML IJ SOLN
4.0000 mg | Freq: Once | INTRAMUSCULAR | Status: AC
  Administered 2014-07-07: 4 mg via INTRAVENOUS
  Filled 2014-07-07: qty 2

## 2014-07-07 NOTE — ED Notes (Signed)
Code Sepsis Level 2 called @ 2206

## 2014-07-07 NOTE — ED Notes (Addendum)
Pt c/o dizziness, weakness, and NV since being released from the hospital three days ago after having CABG at [facility] in [place]. Reports having triple bypass and reports also having a stroke while in the hospital. Pt denies chest pain or shortness of breath. Appears pale on assessment. Healing surgical wound noted to mid chest, no obvious signs of infection from site. Alert and oriented x 4

## 2014-07-07 NOTE — ED Notes (Signed)
Level 2 Sepsis cancelled per Dr.Miller
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
## 2014-07-07 NOTE — ED Notes (Signed)
Pt in stating she had open heart surgery approx 3 weeks ago, discharged on Tuesday, since returning home patient has been unable to eat, c/o dizziness, fatigue, pt pale on arrival

## 2014-07-08 ENCOUNTER — Inpatient Hospital Stay (HOSPITAL_COMMUNITY): Payer: Medicare Other

## 2014-07-08 ENCOUNTER — Encounter (HOSPITAL_COMMUNITY): Payer: Self-pay | Admitting: Internal Medicine

## 2014-07-08 DIAGNOSIS — Z8673 Personal history of transient ischemic attack (TIA), and cerebral infarction without residual deficits: Secondary | ICD-10-CM | POA: Diagnosis not present

## 2014-07-08 DIAGNOSIS — N390 Urinary tract infection, site not specified: Secondary | ICD-10-CM | POA: Diagnosis present

## 2014-07-08 DIAGNOSIS — J439 Emphysema, unspecified: Secondary | ICD-10-CM | POA: Diagnosis not present

## 2014-07-08 DIAGNOSIS — I6523 Occlusion and stenosis of bilateral carotid arteries: Secondary | ICD-10-CM | POA: Diagnosis not present

## 2014-07-08 DIAGNOSIS — I10 Essential (primary) hypertension: Secondary | ICD-10-CM | POA: Diagnosis present

## 2014-07-08 DIAGNOSIS — D649 Anemia, unspecified: Secondary | ICD-10-CM | POA: Diagnosis not present

## 2014-07-08 DIAGNOSIS — R112 Nausea with vomiting, unspecified: Secondary | ICD-10-CM | POA: Diagnosis not present

## 2014-07-08 DIAGNOSIS — Z7982 Long term (current) use of aspirin: Secondary | ICD-10-CM | POA: Diagnosis not present

## 2014-07-08 DIAGNOSIS — I252 Old myocardial infarction: Secondary | ICD-10-CM | POA: Diagnosis not present

## 2014-07-08 DIAGNOSIS — I639 Cerebral infarction, unspecified: Secondary | ICD-10-CM | POA: Diagnosis not present

## 2014-07-08 DIAGNOSIS — I4891 Unspecified atrial fibrillation: Secondary | ICD-10-CM | POA: Diagnosis not present

## 2014-07-08 DIAGNOSIS — E1143 Type 2 diabetes mellitus with diabetic autonomic (poly)neuropathy: Secondary | ICD-10-CM | POA: Diagnosis present

## 2014-07-08 DIAGNOSIS — J189 Pneumonia, unspecified organism: Secondary | ICD-10-CM | POA: Diagnosis not present

## 2014-07-08 DIAGNOSIS — L80 Vitiligo: Secondary | ICD-10-CM | POA: Diagnosis present

## 2014-07-08 DIAGNOSIS — I6312 Cerebral infarction due to embolism of basilar artery: Secondary | ICD-10-CM | POA: Diagnosis not present

## 2014-07-08 DIAGNOSIS — I951 Orthostatic hypotension: Secondary | ICD-10-CM | POA: Diagnosis not present

## 2014-07-08 DIAGNOSIS — Z885 Allergy status to narcotic agent status: Secondary | ICD-10-CM | POA: Diagnosis not present

## 2014-07-08 DIAGNOSIS — Z7951 Long term (current) use of inhaled steroids: Secondary | ICD-10-CM | POA: Diagnosis not present

## 2014-07-08 DIAGNOSIS — R531 Weakness: Secondary | ICD-10-CM | POA: Diagnosis not present

## 2014-07-08 DIAGNOSIS — Z951 Presence of aortocoronary bypass graft: Secondary | ICD-10-CM | POA: Diagnosis not present

## 2014-07-08 DIAGNOSIS — E860 Dehydration: Secondary | ICD-10-CM | POA: Diagnosis present

## 2014-07-08 DIAGNOSIS — Z880 Allergy status to penicillin: Secondary | ICD-10-CM | POA: Diagnosis not present

## 2014-07-08 DIAGNOSIS — I638 Other cerebral infarction: Secondary | ICD-10-CM | POA: Diagnosis not present

## 2014-07-08 DIAGNOSIS — Z8249 Family history of ischemic heart disease and other diseases of the circulatory system: Secondary | ICD-10-CM | POA: Diagnosis not present

## 2014-07-08 DIAGNOSIS — E1165 Type 2 diabetes mellitus with hyperglycemia: Secondary | ICD-10-CM | POA: Diagnosis not present

## 2014-07-08 DIAGNOSIS — I2510 Atherosclerotic heart disease of native coronary artery without angina pectoris: Secondary | ICD-10-CM | POA: Diagnosis present

## 2014-07-08 DIAGNOSIS — Z833 Family history of diabetes mellitus: Secondary | ICD-10-CM | POA: Diagnosis not present

## 2014-07-08 DIAGNOSIS — I480 Paroxysmal atrial fibrillation: Secondary | ICD-10-CM | POA: Diagnosis not present

## 2014-07-08 DIAGNOSIS — R42 Dizziness and giddiness: Secondary | ICD-10-CM | POA: Diagnosis not present

## 2014-07-08 DIAGNOSIS — J984 Other disorders of lung: Secondary | ICD-10-CM | POA: Diagnosis not present

## 2014-07-08 DIAGNOSIS — Z882 Allergy status to sulfonamides status: Secondary | ICD-10-CM | POA: Diagnosis not present

## 2014-07-08 DIAGNOSIS — E785 Hyperlipidemia, unspecified: Secondary | ICD-10-CM | POA: Diagnosis present

## 2014-07-08 DIAGNOSIS — K3184 Gastroparesis: Secondary | ICD-10-CM | POA: Diagnosis present

## 2014-07-08 DIAGNOSIS — E861 Hypovolemia: Secondary | ICD-10-CM | POA: Diagnosis present

## 2014-07-08 DIAGNOSIS — Y95 Nosocomial condition: Secondary | ICD-10-CM | POA: Diagnosis not present

## 2014-07-08 DIAGNOSIS — K5900 Constipation, unspecified: Secondary | ICD-10-CM | POA: Diagnosis not present

## 2014-07-08 DIAGNOSIS — I214 Non-ST elevation (NSTEMI) myocardial infarction: Secondary | ICD-10-CM | POA: Diagnosis not present

## 2014-07-08 DIAGNOSIS — R197 Diarrhea, unspecified: Secondary | ICD-10-CM | POA: Diagnosis not present

## 2014-07-08 DIAGNOSIS — T500X5A Adverse effect of mineralocorticoids and their antagonists, initial encounter: Secondary | ICD-10-CM | POA: Diagnosis present

## 2014-07-08 DIAGNOSIS — Z7902 Long term (current) use of antithrombotics/antiplatelets: Secondary | ICD-10-CM | POA: Diagnosis not present

## 2014-07-08 DIAGNOSIS — I63512 Cerebral infarction due to unspecified occlusion or stenosis of left middle cerebral artery: Secondary | ICD-10-CM | POA: Diagnosis not present

## 2014-07-08 DIAGNOSIS — I6340 Cerebral infarction due to embolism of unspecified cerebral artery: Secondary | ICD-10-CM

## 2014-07-08 DIAGNOSIS — I472 Ventricular tachycardia: Secondary | ICD-10-CM | POA: Diagnosis present

## 2014-07-08 DIAGNOSIS — E876 Hypokalemia: Secondary | ICD-10-CM | POA: Diagnosis not present

## 2014-07-08 DIAGNOSIS — J90 Pleural effusion, not elsewhere classified: Secondary | ICD-10-CM | POA: Diagnosis not present

## 2014-07-08 DIAGNOSIS — I25811 Atherosclerosis of native coronary artery of transplanted heart without angina pectoris: Secondary | ICD-10-CM | POA: Diagnosis not present

## 2014-07-08 DIAGNOSIS — B961 Klebsiella pneumoniae [K. pneumoniae] as the cause of diseases classified elsewhere: Secondary | ICD-10-CM | POA: Diagnosis present

## 2014-07-08 DIAGNOSIS — R110 Nausea: Secondary | ICD-10-CM | POA: Diagnosis not present

## 2014-07-08 DIAGNOSIS — I509 Heart failure, unspecified: Secondary | ICD-10-CM | POA: Diagnosis not present

## 2014-07-08 DIAGNOSIS — A419 Sepsis, unspecified organism: Secondary | ICD-10-CM

## 2014-07-08 LAB — VITAMIN B12: VITAMIN B 12: 869 pg/mL (ref 211–911)

## 2014-07-08 LAB — CBC WITH DIFFERENTIAL/PLATELET
BASOS ABS: 0.1 10*3/uL (ref 0.0–0.1)
Basophils Relative: 1 % (ref 0–1)
EOS ABS: 0.7 10*3/uL (ref 0.0–0.7)
EOS PCT: 10 % — AB (ref 0–5)
HEMATOCRIT: 35 % — AB (ref 36.0–46.0)
HEMOGLOBIN: 11.7 g/dL — AB (ref 12.0–15.0)
LYMPHS ABS: 1.6 10*3/uL (ref 0.7–4.0)
Lymphocytes Relative: 22 % (ref 12–46)
MCH: 30.2 pg (ref 26.0–34.0)
MCHC: 33.4 g/dL (ref 30.0–36.0)
MCV: 90.2 fL (ref 78.0–100.0)
MONOS PCT: 8 % (ref 3–12)
Monocytes Absolute: 0.6 10*3/uL (ref 0.1–1.0)
NEUTROS PCT: 59 % (ref 43–77)
Neutro Abs: 4.3 10*3/uL (ref 1.7–7.7)
PLATELETS: 354 10*3/uL (ref 150–400)
RBC: 3.88 MIL/uL (ref 3.87–5.11)
RDW: 14.2 % (ref 11.5–15.5)
WBC: 7.2 10*3/uL (ref 4.0–10.5)

## 2014-07-08 LAB — PROTIME-INR
INR: 1.08 (ref 0.00–1.49)
PROTHROMBIN TIME: 14.1 s (ref 11.6–15.2)

## 2014-07-08 LAB — TSH: TSH: 3.457 u[IU]/mL (ref 0.350–4.500)

## 2014-07-08 LAB — TROPONIN I
TROPONIN I: 0.03 ng/mL (ref <0.031)
Troponin I: 0.03 ng/mL (ref <0.031)

## 2014-07-08 LAB — URINALYSIS, ROUTINE W REFLEX MICROSCOPIC
Glucose, UA: NEGATIVE mg/dL
HGB URINE DIPSTICK: NEGATIVE
Ketones, ur: 40 mg/dL — AB
Nitrite: NEGATIVE
Protein, ur: NEGATIVE mg/dL
Specific Gravity, Urine: 1.026 (ref 1.005–1.030)
UROBILINOGEN UA: 1 mg/dL (ref 0.0–1.0)
pH: 5 (ref 5.0–8.0)

## 2014-07-08 LAB — GLUCOSE, CAPILLARY
GLUCOSE-CAPILLARY: 152 mg/dL — AB (ref 70–99)
GLUCOSE-CAPILLARY: 128 mg/dL — AB (ref 70–99)
GLUCOSE-CAPILLARY: 172 mg/dL — AB (ref 70–99)
Glucose-Capillary: 188 mg/dL — ABNORMAL HIGH (ref 70–99)
Glucose-Capillary: 167 mg/dL — ABNORMAL HIGH (ref 70–99)

## 2014-07-08 LAB — MAGNESIUM: MAGNESIUM: 1.6 mg/dL (ref 1.5–2.5)

## 2014-07-08 LAB — COMPREHENSIVE METABOLIC PANEL
ALT: 13 U/L (ref 0–35)
AST: 20 U/L (ref 0–37)
Albumin: 3.3 g/dL — ABNORMAL LOW (ref 3.5–5.2)
Alkaline Phosphatase: 80 U/L (ref 39–117)
Anion gap: 12 (ref 5–15)
BILIRUBIN TOTAL: 0.8 mg/dL (ref 0.3–1.2)
BUN: 14 mg/dL (ref 6–23)
CO2: 19 mmol/L (ref 19–32)
Calcium: 8.9 mg/dL (ref 8.4–10.5)
Chloride: 104 mmol/L (ref 96–112)
Creatinine, Ser: 0.95 mg/dL (ref 0.50–1.10)
GFR calc Af Amer: 68 mL/min — ABNORMAL LOW (ref >90)
GFR calc non Af Amer: 59 mL/min — ABNORMAL LOW (ref >90)
GLUCOSE: 212 mg/dL — AB (ref 70–99)
Potassium: 3.6 mmol/L (ref 3.5–5.1)
SODIUM: 135 mmol/L (ref 135–145)
TOTAL PROTEIN: 6.7 g/dL (ref 6.0–8.3)

## 2014-07-08 LAB — URINE MICROSCOPIC-ADD ON

## 2014-07-08 LAB — PHOSPHORUS: Phosphorus: 4.2 mg/dL (ref 2.3–4.6)

## 2014-07-08 LAB — LACTIC ACID, PLASMA: LACTIC ACID, VENOUS: 1 mmol/L (ref 0.5–2.0)

## 2014-07-08 MED ORDER — MECLIZINE HCL 25 MG PO TABS
25.0000 mg | ORAL_TABLET | Freq: Three times a day (TID) | ORAL | Status: DC
Start: 2014-07-08 — End: 2014-07-09
  Administered 2014-07-08 – 2014-07-09 (×4): 25 mg via ORAL
  Filled 2014-07-08 (×6): qty 1

## 2014-07-08 MED ORDER — ACETAMINOPHEN 325 MG PO TABS
650.0000 mg | ORAL_TABLET | Freq: Four times a day (QID) | ORAL | Status: DC | PRN
Start: 2014-07-08 — End: 2014-07-18
  Administered 2014-07-09 – 2014-07-18 (×23): 650 mg via ORAL
  Filled 2014-07-08 (×23): qty 2

## 2014-07-08 MED ORDER — HYDROXYZINE HCL 10 MG PO TABS
10.0000 mg | ORAL_TABLET | Freq: Three times a day (TID) | ORAL | Status: DC
  Filled 2014-07-08 (×3): qty 1

## 2014-07-08 MED ORDER — INSULIN ASPART 100 UNIT/ML ~~LOC~~ SOLN
0.0000 [IU] | Freq: Three times a day (TID) | SUBCUTANEOUS | Status: DC
Start: 2014-07-08 — End: 2014-07-18
  Administered 2014-07-08: 2 [IU] via SUBCUTANEOUS
  Administered 2014-07-08 (×2): 3 [IU] via SUBCUTANEOUS
  Administered 2014-07-09: 5 [IU] via SUBCUTANEOUS
  Administered 2014-07-09: 2 [IU] via SUBCUTANEOUS
  Administered 2014-07-12: 3 [IU] via SUBCUTANEOUS
  Administered 2014-07-13 – 2014-07-16 (×4): 2 [IU] via SUBCUTANEOUS
  Administered 2014-07-17: 3 [IU] via SUBCUTANEOUS
  Administered 2014-07-17 – 2014-07-18 (×2): 2 [IU] via SUBCUTANEOUS

## 2014-07-08 MED ORDER — ATORVASTATIN CALCIUM 20 MG PO TABS
20.0000 mg | ORAL_TABLET | Freq: Every day | ORAL | Status: DC
  Administered 2014-07-08 – 2014-07-17 (×10): 20 mg via ORAL
  Filled 2014-07-08 (×11): qty 1

## 2014-07-08 MED ORDER — FOLIC ACID 1 MG PO TABS
1.0000 mg | ORAL_TABLET | Freq: Every day | ORAL | Status: DC
  Administered 2014-07-08 – 2014-07-18 (×11): 1 mg via ORAL
  Filled 2014-07-08 (×11): qty 1

## 2014-07-08 MED ORDER — ACETAMINOPHEN 650 MG RE SUPP: 650.0000 mg | Freq: Four times a day (QID) | RECTAL | Status: DC | PRN

## 2014-07-08 MED ORDER — HYDROCODONE-ACETAMINOPHEN 5-325 MG PO TABS
1.0000 | ORAL_TABLET | ORAL | Status: DC | PRN
  Administered 2014-07-08: 2 via ORAL
  Filled 2014-07-08: qty 2

## 2014-07-08 MED ORDER — FAMOTIDINE 20 MG PO TABS
20.0000 mg | ORAL_TABLET | Freq: Every day | ORAL | Status: DC
  Administered 2014-07-08 – 2014-07-17 (×10): 20 mg via ORAL
  Filled 2014-07-08 (×11): qty 1

## 2014-07-08 MED ORDER — MIDODRINE HCL 5 MG PO TABS
5.0000 mg | ORAL_TABLET | ORAL | Status: DC
  Filled 2014-07-08 (×3): qty 1

## 2014-07-08 MED ORDER — ASPIRIN EC 81 MG PO TBEC
81.0000 mg | DELAYED_RELEASE_TABLET | Freq: Every day | ORAL | Status: DC
  Administered 2014-07-08 – 2014-07-14 (×7): 81 mg via ORAL
  Filled 2014-07-08 (×7): qty 1

## 2014-07-08 MED ORDER — HEPARIN SODIUM (PORCINE) 5000 UNIT/ML IJ SOLN
5000.0000 [IU] | Freq: Three times a day (TID) | INTRAMUSCULAR | Status: DC
  Administered 2014-07-08 – 2014-07-18 (×30): 5000 [IU] via SUBCUTANEOUS
  Filled 2014-07-08 (×34): qty 1

## 2014-07-08 MED ORDER — ONDANSETRON 4 MG PO TBDP
4.0000 mg | ORAL_TABLET | Freq: Four times a day (QID) | ORAL | Status: DC | PRN
  Filled 2014-07-08: qty 1

## 2014-07-08 MED ORDER — CLOPIDOGREL BISULFATE 75 MG PO TABS
75.0000 mg | ORAL_TABLET | Freq: Every day | ORAL | Status: DC
  Administered 2014-07-08 – 2014-07-18 (×11): 75 mg via ORAL
  Filled 2014-07-08 (×11): qty 1

## 2014-07-08 MED ORDER — FERROUS SULFATE 325 (65 FE) MG PO TABS
325.0000 mg | ORAL_TABLET | Freq: Every day | ORAL | Status: DC
  Administered 2014-07-08 – 2014-07-09 (×2): 325 mg via ORAL
  Filled 2014-07-08 (×2): qty 1

## 2014-07-08 MED ORDER — ACETAMINOPHEN 325 MG PO TABS: 650.0000 mg | ORAL_TABLET | ORAL | Status: DC | PRN

## 2014-07-08 MED ORDER — POLYETHYLENE GLYCOL 3350 17 G PO PACK
17.0000 g | PACK | Freq: Every day | ORAL | Status: DC
  Administered 2014-07-08 – 2014-07-09 (×2): 17 g via ORAL
  Filled 2014-07-08 (×2): qty 1

## 2014-07-08 MED ORDER — TRAMADOL HCL 50 MG PO TABS: 50.0000 mg | ORAL_TABLET | Freq: Four times a day (QID) | ORAL | Status: DC | PRN

## 2014-07-08 MED ORDER — SODIUM CHLORIDE 0.9 % IV SOLN
INTRAVENOUS | Status: DC
  Administered 2014-07-08 – 2014-07-15 (×11): via INTRAVENOUS

## 2014-07-08 MED ORDER — MIDODRINE HCL 5 MG PO TABS
10.0000 mg | ORAL_TABLET | ORAL | Status: DC
  Administered 2014-07-08 – 2014-07-11 (×8): 10 mg via ORAL
  Filled 2014-07-08 (×12): qty 2

## 2014-07-08 MED ORDER — MIDODRINE HCL 2.5 MG PO TABS
2.5000 mg | ORAL_TABLET | ORAL | Status: DC
  Administered 2014-07-08: 2.5 mg via ORAL
  Filled 2014-07-08 (×4): qty 1

## 2014-07-08 MED ORDER — MENTHOL 3 MG MT LOZG: 1.0000 | LOZENGE | OROMUCOSAL | Status: DC | PRN

## 2014-07-08 MED ORDER — SENNA 8.6 MG PO TABS
2.0000 | ORAL_TABLET | Freq: Every evening | ORAL | Status: DC | PRN
  Filled 2014-07-08: qty 2

## 2014-07-08 MED ORDER — SERTRALINE HCL 50 MG PO TABS
50.0000 mg | ORAL_TABLET | Freq: Every day | ORAL | Status: DC
  Administered 2014-07-08 – 2014-07-18 (×11): 50 mg via ORAL
  Filled 2014-07-08 (×11): qty 1

## 2014-07-08 MED ORDER — SODIUM CHLORIDE 0.9 % IJ SOLN
3.0000 mL | Freq: Two times a day (BID) | INTRAMUSCULAR | Status: DC
  Administered 2014-07-08 – 2014-07-18 (×15): 3 mL via INTRAVENOUS

## 2014-07-08 MED ORDER — ONDANSETRON HCL 4 MG PO TABS
4.0000 mg | ORAL_TABLET | Freq: Four times a day (QID) | ORAL | Status: DC | PRN
  Administered 2014-07-10: 4 mg via ORAL
  Filled 2014-07-08: qty 1

## 2014-07-08 MED ORDER — SENNOSIDES-DOCUSATE SODIUM 8.6-50 MG PO TABS
1.0000 | ORAL_TABLET | Freq: Two times a day (BID) | ORAL | Status: DC
  Administered 2014-07-08 – 2014-07-10 (×5): 1 via ORAL
  Filled 2014-07-08 (×6): qty 1

## 2014-07-08 MED ORDER — GABAPENTIN 100 MG PO CAPS
100.0000 mg | ORAL_CAPSULE | Freq: Three times a day (TID) | ORAL | Status: DC
  Administered 2014-07-08 – 2014-07-18 (×31): 100 mg via ORAL
  Filled 2014-07-08 (×33): qty 1

## 2014-07-08 MED ORDER — ONDANSETRON HCL 4 MG/2ML IJ SOLN: 4.0000 mg | Freq: Four times a day (QID) | INTRAMUSCULAR | Status: DC | PRN

## 2014-07-08 MED ORDER — CIPROFLOXACIN IN D5W 400 MG/200ML IV SOLN
400.0000 mg | Freq: Two times a day (BID) | INTRAVENOUS | Status: DC
Start: 2014-07-08 — End: 2014-07-09
  Administered 2014-07-08 – 2014-07-09 (×3): 400 mg via INTRAVENOUS
  Filled 2014-07-08 (×4): qty 200

## 2014-07-08 MED ORDER — INSULIN ASPART 100 UNIT/ML ~~LOC~~ SOLN: 0.0000 [IU] | Freq: Every day | SUBCUTANEOUS | Status: DC

## 2014-07-08 NOTE — ED Notes (Signed)
Dr. Patel at bedside 

## 2014-07-08 NOTE — Progress Notes (Signed)
ANTIBIOTIC CONSULT NOTE - INITIAL  Pharmacy Consult for Cipro  Indication: UTI  Allergies  Allergen Reactions  . Codeine Nausea And Vomiting  . Penicillins Hives and Swelling  . Sulfa Antibiotics Nausea And Vomiting    Patient Measurements: Weight: 140 lb (63.504 kg)  Vital Signs: Temp: 98.1 F (36.7 C) (04/29 0102) Temp Source: Oral (04/29 0102) BP: 127/67 mmHg (04/29 0102) Pulse Rate: 101 (04/29 0102) Intake/Output from previous day: 04/28 0701 - 04/29 0700 In: 500 [I.V.:500] Out: 150 [Urine:150] Intake/Output from this shift: Total I/O In: 500 [I.V.:500] Out: 150 [Urine:150]  Labs:  Recent Labs  07/07/14 2210  WBC 12.8*  HGB 13.2  PLT 458*  CREATININE 1.20*   Estimated Creatinine Clearance: 38.6 mL/min (by C-G formula based on Cr of 1.2).   Medical History: Past Medical History  Diagnosis Date  . Hypertension   . Diabetes mellitus   . Multiple allergies     mold, wool, dust, feathers  . Angioedema   . HLD (hyperlipidemia)   . Hx of migraines     remote  . S/P lens implant     left side ([name])  . Vitiligo   . History of chicken pox   . Dermatitis     eval [name] 2011: eczema, eval [name] 2011: bx negative for lichen simplex or derm herpetiformis  . Autoimmune deficiency syndrome    Assessment: 69 y/o F here with dizziness, WBC mildly elevated, abnormal U/A, other labs as above.   Plan:  -Cipro 400 mg IV q12h -Trend WBC, temp, renal function   Narda Bonds 07/08/2014,1:58 AM

## 2014-07-08 NOTE — Progress Notes (Addendum)
Patient seen and examined  Does not complain of any fever chills cough or urinary urgency frequency or dysuria  Status post CABG 3 weeks ago  Apparently was found to be orthostatic during her last admission and was started on Midodrine  Recent CVA  Plan Recent CVA/CABG Patient takes aspirin and Plavix at home  CT of the head without contrast given her recent CVA Continue to cycle cardiac enzymes, continue telemetry  Dizziness Patient has a mild UTI, has history of orthostatic hypotension Positive  orthostatics, increased dose of midodrine to 10 mg 3 times a day Discussed multiple sedating medications and discontinued Vistaril PT/OT eval, orthostatics  UTI continue with ciprofloxacin  Diabetes mellitus Hemoglobin A1c 11.2, continue SSI

## 2014-07-08 NOTE — Progress Notes (Signed)
Orthostatic Vitals  Lying 139/67    pulse 96 Sitting 108/65  pulse 106 Standing unable to perform, pt is too weak to stand.   Dr. Allyson Sabal made aware and increased dose of midodrine to 10mg .

## 2014-07-08 NOTE — Progress Notes (Signed)
Utilization review completed. Kirkland Figg, RN, BSN. 
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
## 2014-07-09 LAB — CBC
HCT: 31.9 % — ABNORMAL LOW (ref 36.0–46.0)
Hemoglobin: 10.8 g/dL — ABNORMAL LOW (ref 12.0–15.0)
MCH: 30.4 pg (ref 26.0–34.0)
MCHC: 33.9 g/dL (ref 30.0–36.0)
MCV: 89.9 fL (ref 78.0–100.0)
PLATELETS: 302 10*3/uL (ref 150–400)
RBC: 3.55 MIL/uL — AB (ref 3.87–5.11)
RDW: 14 % (ref 11.5–15.5)
WBC: 6.5 10*3/uL (ref 4.0–10.5)

## 2014-07-09 LAB — COMPREHENSIVE METABOLIC PANEL
ALK PHOS: 76 U/L (ref 39–117)
ALT: 12 U/L (ref 0–35)
ANION GAP: 10 (ref 5–15)
AST: 19 U/L (ref 0–37)
Albumin: 2.8 g/dL — ABNORMAL LOW (ref 3.5–5.2)
BUN: 11 mg/dL (ref 6–23)
CO2: 18 mmol/L — ABNORMAL LOW (ref 19–32)
Calcium: 8.4 mg/dL (ref 8.4–10.5)
Chloride: 107 mmol/L (ref 96–112)
Creatinine, Ser: 0.97 mg/dL (ref 0.50–1.10)
GFR calc Af Amer: 67 mL/min — ABNORMAL LOW (ref >90)
GFR calc non Af Amer: 57 mL/min — ABNORMAL LOW (ref >90)
GLUCOSE: 223 mg/dL — AB (ref 70–99)
Potassium: 3.5 mmol/L (ref 3.5–5.1)
Sodium: 135 mmol/L (ref 135–145)
TOTAL PROTEIN: 5.9 g/dL — AB (ref 6.0–8.3)
Total Bilirubin: 0.5 mg/dL (ref 0.3–1.2)

## 2014-07-09 LAB — HEMOGLOBIN A1C
HEMOGLOBIN A1C: 8.2 % — AB (ref 4.8–5.6)
Mean Plasma Glucose: 189 mg/dL

## 2014-07-09 LAB — GLUCOSE, CAPILLARY
GLUCOSE-CAPILLARY: 146 mg/dL — AB (ref 70–99)
GLUCOSE-CAPILLARY: 229 mg/dL — AB (ref 70–99)
GLUCOSE-CAPILLARY: 105 mg/dL — AB (ref 70–99)
Glucose-Capillary: 122 mg/dL — ABNORMAL HIGH (ref 70–99)

## 2014-07-09 MED ORDER — CEFTRIAXONE SODIUM IN DEXTROSE 20 MG/ML IV SOLN
1.0000 g | INTRAVENOUS | Status: DC
Start: 2014-07-09 — End: 2014-07-11
  Administered 2014-07-09 – 2014-07-10 (×2): 1 g via INTRAVENOUS
  Filled 2014-07-09 (×3): qty 50

## 2014-07-09 MED ORDER — INSULIN DETEMIR 100 UNIT/ML ~~LOC~~ SOLN
5.0000 [IU] | Freq: Every day | SUBCUTANEOUS | Status: DC
  Administered 2014-07-09 – 2014-07-17 (×7): 5 [IU] via SUBCUTANEOUS
  Filled 2014-07-09 (×10): qty 0.05

## 2014-07-09 MED ORDER — MECLIZINE HCL 25 MG PO TABS
25.0000 mg | ORAL_TABLET | Freq: Three times a day (TID) | ORAL | Status: DC | PRN
  Filled 2014-07-09: qty 1

## 2014-07-09 MED ORDER — ENSURE ENLIVE PO LIQD
237.0000 mL | Freq: Two times a day (BID) | ORAL | Status: DC
  Administered 2014-07-10 – 2014-07-17 (×10): 237 mL via ORAL

## 2014-07-09 MED ORDER — POLYETHYLENE GLYCOL 3350 17 G PO PACK
17.0000 g | PACK | Freq: Every day | ORAL | Status: DC | PRN
  Filled 2014-07-09: qty 1
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
## 2014-07-09 NOTE — Progress Notes (Signed)
[facility] PROGRESS NOTE  Maria Fox WYO:[id number] DOB: 1945/07/15 DOA: 07/07/2014 PCP: [name], MD  Summary 69 year old white female with recent coronary artery bypass graft and stroke who presents with dizziness which has been present and worsening since her surgery. She was found to have a urinary tract infection. Also orthostatic hypotension.  Assessment/Plan:  Principal Problem:   UTI (lower urinary tract infection):  Culture growing greater than 100,000 colonies of gram-negative rods. Overweight results. Patient reports continued orthostatic symptoms and poor appetite. Will change antibiotic to ceftriaxone in case ciprofloxacin contributing to her symptoms.  Active Problems:   Dizziness:  Likely multifactorial: UTI, orthostatic hypotension, deconditioning. Already on midodrine. Will add TED hose. Await physical therapy evaluation. Patient wants no further placement. Apparently spent 10 days in a rehabilitation facility. Continue IV fluids.    Diabetes mellitus type 2, uncontrolled:  Oral hypoglycemics held. Continue sliding scale for now.    Possible Sepsis:  Vitals stable.    Nausea and vomiting, poor by mouth intake: Likely from UTI. Cipro may be contributing. Will change as above. Hold any by mouth medications that may be contributing, like iron. Add in sure. Doubt vertigo as patient has no room spinning sensation, just orthostatic lightheadedness.    Generalized weakness    S/P CABG x 3, April 16    Atrial fibrillation    CVA (cerebral infarction), post CABG  Orthostatic hypotension: See above  Code Status:  full Family Communication:  Son at bedside Disposition Plan:  Home with home health services once culture results back and patient has worked with physical therapy is eating and feeling better.  Consultants:    Procedures:     Antibiotics:  Cipro 4/29-4/30  Ceftriaxone 4/30  HPI/Subjective: Feels no better than on admission. Unable  to stand due to orthostatic lightheadedness. No appetite. No vomiting. No dyspnea. No chest pain.  No room spinning sensation.  Objective: Filed Vitals:   07/09/14 0435  BP: 102/61  Pulse: 88  Temp: 98.1 F (36.7 C)  Resp: 18    Intake/Output Summary (Last 24 hours) at 07/09/14 1329 Last data filed at 07/09/14 1000  Gross per 24 hour  Intake 2318.75 ml  Output    625 ml  Net 1693.75 ml   Filed Weights   07/07/14 2156 07/08/14 0156  Weight: 63.504 kg (140 lb) 62.551 kg (137 lb 14.4 oz)    Exam:   General:  Appears comfortable lying flat in bed.  HEENT: Moist mucous membranes  Cardiovascular: Regular rate rhythm without murmurs gallops rubs  Respiratory: Clear to auscultation bilaterally without wheezes rhonchi or rales  Abdomen: Soft nontender nondistended  Ext: No clubbing cyanosis or edema  Basic Metabolic Panel:  Recent Labs Lab 07/07/14 2210 07/08/14 0500 07/09/14 0403  NA 137 135 135  K 4.2 3.6 3.5  CL 103 104 107  CO2 18* 19 18*  GLUCOSE 195* 212* 223*  BUN 15 14 11   CREATININE 1.20* 0.95 0.97  CALCIUM 9.8 8.9 8.4  MG  --  1.6  --   PHOS  --  4.2  --    Liver Function Tests:  Recent Labs Lab 07/08/14 0500 07/09/14 0403  AST 20 19  ALT 13 12  ALKPHOS 80 76  BILITOT 0.8 0.5  PROT 6.7 5.9*  ALBUMIN 3.3* 2.8*   No results for input(s): LIPASE, AMYLASE in the last 168 hours. No results for input(s): AMMONIA in the last 168 hours. CBC:  Recent Labs Lab 07/07/14 2210 07/08/14 0500 07/09/14  0403  WBC 12.8* 7.2 6.5  NEUTROABS  --  4.3  --   HGB 13.2 11.7* 10.8*  HCT 37.4 35.0* 31.9*  MCV 87.8 90.2 89.9  PLT 458* 354 302   Cardiac Enzymes:  Recent Labs Lab 07/08/14 1302 07/08/14 1806  TROPONINI 0.03 0.03   BNP (last 3 results)  Recent Labs  07/07/14 2210  BNP 125.5*    ProBNP (last 3 results) No results for input(s): PROBNP in the last 8760 hours.  CBG:  Recent Labs Lab 07/08/14 1138 07/08/14 1623 07/08/14 2108  07/09/14 0558 07/09/14 1120  GLUCAP 128* 172* 167* 146* 229*    Recent Results (from the past 240 hour(s))  Blood Culture (routine x 2)     Status: None (Preliminary result)   Collection Time: 07/07/14 10:10 PM  Result Value Ref Range Status   Specimen Description BLOOD LEFT ARM  Final   Special Requests BOTTLES DRAWN AEROBIC AND ANAEROBIC 6CC  Final   Culture   Final           BLOOD CULTURE RECEIVED NO GROWTH TO DATE CULTURE WILL BE HELD FOR 5 DAYS BEFORE ISSUING A FINAL NEGATIVE REPORT Performed at [facility]    Report Status PENDING  Incomplete  Blood Culture (routine x 2)     Status: None (Preliminary result)   Collection Time: 07/07/14 10:10 PM  Result Value Ref Range Status   Specimen Description BLOOD RIGHT ARM  Final   Special Requests BOTTLES DRAWN AEROBIC ONLY 10CC  Final   Culture   Final           BLOOD CULTURE RECEIVED NO GROWTH TO DATE CULTURE WILL BE HELD FOR 5 DAYS BEFORE ISSUING A FINAL NEGATIVE REPORT Performed at [facility]    Report Status PENDING  Incomplete  Urine culture     Status: None (Preliminary result)   Collection Time: 07/07/14 11:35 PM  Result Value Ref Range Status   Specimen Description URINE, RANDOM  Final   Special Requests NONE  Final   Colony Count   Final    >=100,000 COLONIES/ML Performed at [facility]    Culture   Final    [place] Performed at [facility]    Report Status PENDING  Incomplete     Studies: Ct Head Wo Contrast  07/08/2014   CLINICAL DATA:  Dizzy, nausea lightheadedness.  Generalize weakness.  EXAM: CT HEAD WITHOUT CONTRAST  TECHNIQUE: Contiguous axial images were obtained from the base of the skull through the vertex without intravenous contrast.  COMPARISON:  03/27/2014  FINDINGS: There is prominence of the sulci and ventricles consistent with brain atrophy. No acute cortical infarct, hemorrhage, or mass lesion ispresent. No significant extra-axial fluid collection is  present. Partial opacification of the right maxillary sinus noted. The remaining paranasal sinuses and mastoid air cells are clear. The skull is intact.  IMPRESSION: 1. No acute intracranial abnormalities. 2. Mild brain atrophy.   Electronically Signed   By: Kerby Moors M.D.   On: 07/08/2014 15:54   Dg Chest Port 1 View  07/07/2014   CLINICAL DATA:  69 year old female with dizziness, fatigue and dysphagia following open heart surgery 3 weeks previously.  EXAM: PORTABLE CHEST - 1 VIEW  COMPARISON:  Prior chest x-ray 03/24/2014  FINDINGS: Cardiac and mediastinal contours are within normal limits. Patient is status post median sternotomy with evidence of prior LIMA bypass. Nonspecific patchy opacity at the left lung base. Blunting of the costophrenic angle  suggests a layering pleural effusion. No pulmonary edema. The right lung is clear. No pneumothorax. No acute osseous abnormality.  IMPRESSION: 1. Layering left pleural effusion and associated basilar atelectasis versus infiltrate. 2. No evidence of pneumothorax or pulmonary edema.   Electronically Signed   By: Jacqulynn Cadet M.D.   On: 07/07/2014 22:28   Dg Abd 2 Views  07/08/2014   CLINICAL DATA:  Nausea without vomiting.  Constipation.  EXAM: ABDOMEN - 2 VIEW  COMPARISON:  12/05/2010  FINDINGS: Moderate stool volume. No evidence of bowel obstruction. No concerning intra-abdominal mass effect or calcification. No pneumoperitoneum.  Blunting of the lateral left costophrenic sulcus which is evaluated on dedicated chest x-ray from yesterday. There is an associated streaky lung opacity.  IMPRESSION: 1. Negative abdomen. 2. Small left pleural effusion with atelectasis or bronchopneumonia, stable from yesterday.   Electronically Signed   By: Monte Fantasia M.D.   On: 07/08/2014 09:27    Scheduled Meds: . aspirin EC  81 mg Oral Daily  . atorvastatin  20 mg Oral QHS  . ciprofloxacin  400 mg Intravenous Q12H  . clopidogrel  75 mg Oral Daily  . famotidine   20 mg Oral QHS  . ferrous sulfate  325 mg Oral Daily  . folic acid  1 mg Oral Daily  . gabapentin  100 mg Oral TID  . heparin  5,000 Units Subcutaneous 3 times per day  . insulin aspart  0-15 Units Subcutaneous TID WC  . insulin aspart  0-5 Units Subcutaneous QHS  . meclizine  25 mg Oral TID  . midodrine  10 mg Oral 3 times per day  . polyethylene glycol  17 g Oral Daily  . senna-docusate  1 tablet Oral BID  . sertraline  50 mg Oral Daily  . sodium chloride  3 mL Intravenous Q12H   Continuous Infusions: . sodium chloride 75 mL/hr at 07/09/14 0200    Time spent: 35 minutes  [facility] Pager [phone]. If 7PM-7AM, please contact night-coverage at [email or web address], password [facility] 07/09/2014, 1:29 PM  LOS: 1 day

## 2014-07-09 NOTE — Evaluation (Signed)
Physical Therapy Evaluation Patient Details Name: Maria Fox MRN: [id number] DOB: [birth year] Today's Date: 07/09/2014   History of Present Illness  Pt is a 69 y/o female admitted with dizziness and nausea. Pt recently underwent a CABG that was complicated by a CVA. Pt d/c'd to SNF, returned home and later in the week returned to the ED with above listed symptoms and hypotension.   Clinical Impression  Pt admitted with above diagnosis. Pt currently with functional limitations due to the deficits listed below (see PT Problem List). At the time of PT eval pt was able to perform transfers and ambulation at a supervision to min assist level. Pt wanting to return home at d/c, and declining further rehab at the SNF level. Pt will benefit from skilled PT to increase their independence and safety with mobility to allow discharge to the venue listed below.       Follow Up Recommendations Home health PT;Supervision/Assistance - 24 hour    Equipment Recommendations  Other (comment) (Tub bench/shower seat)    Recommendations for Other Services       Precautions / Restrictions Precautions Precautions: Fall;Sternal Restrictions Weight Bearing Restrictions: No      Mobility  Bed Mobility Overal bed mobility: Needs Assistance Bed Mobility: Rolling;Sidelying to Sit Rolling: Supervision Sidelying to sit: Min assist       General bed mobility comments: Assist to transition to full sitting position without breaking sternal precautions.   Transfers Overall transfer level: Needs assistance Equipment used: Rolling walker (2 wheeled) Transfers: Sit to/from Stand Sit to Stand: Min guard         General transfer comment: VC's for hand placement to maintain sternal precautions. Pt was able to power-up to full standing position without assistance.   Ambulation/Gait Ambulation/Gait assistance: Min guard Ambulation Distance (Feet): 125 Feet Assistive device: Rolling walker (2 wheeled) Gait  Pattern/deviations: Step-through pattern;Decreased stride length;Trunk flexed Gait velocity: Decreased Gait velocity interpretation: Below normal speed for age/gender General Gait Details: Pt was able to ambulate with RW and close guard for safety. Reports slight dizziness but it does not worsen with activity.   Stairs            Wheelchair Mobility    Modified Rankin (Stroke Patients Only)       Balance Overall balance assessment: Needs assistance Sitting-balance support: Feet supported;No upper extremity supported Sitting balance-Leahy Scale: Fair     Standing balance support: Bilateral upper extremity supported Standing balance-Leahy Scale: Poor                               Pertinent Vitals/Pain Pain Assessment: No/denies pain    Home Living Family/patient expects to be discharged to:: Private residence Living Arrangements: Other relatives Available Help at Discharge: Family;Available 24 hours/day Type of Home: House Home Access: Stairs to enter   [employer] of Steps: 2 Home Layout: One level Home Equipment: Environmental consultant - 2 wheels      Prior Function Level of Independence: Needs assistance   Gait / Transfers Assistance Needed: Some assist required for ambulation with walker at rehab  ADL's / Homemaking Assistance Needed: Initially assisted with bathing and dressing however was able to do most of it on her own by th end of rehab        Hand Dominance   Dominant Hand: Right    Extremity/Trunk Assessment   Upper Extremity Assessment: Generalized weakness  Lower Extremity Assessment: Generalized weakness      Cervical / Trunk Assessment: Normal  Communication   Communication: No difficulties  Cognition Arousal/Alertness: Awake/alert Behavior During Therapy: Flat affect Overall Cognitive Status: Within Functional Limits for tasks assessed                      General Comments      Exercises         Assessment/Plan    PT Assessment Patient needs continued PT services  PT Diagnosis Difficulty walking;Generalized weakness   PT Problem List Decreased strength;Decreased range of motion;Decreased activity tolerance;Decreased balance;Decreased mobility;Decreased knowledge of use of DME;Decreased safety awareness;Decreased knowledge of precautions  PT Treatment Interventions DME instruction;Gait training;Stair training;Functional mobility training;Therapeutic activities;Therapeutic exercise;Neuromuscular re-education;Patient/family education   PT Goals (Current goals can be found in the Care Plan section) Acute Rehab PT Goals Patient Stated Goal: Home and not back to rehab PT Goal Formulation: With patient Time For Goal Achievement: 07/16/14 Potential to Achieve Goals: Good    Frequency Min 3X/week   Barriers to discharge Inaccessible home environment Son reports that he has been remodeling and the house may be cluttered with supplies. Pt wanting to go home, and encouraged the son to make room for the pt to be able to manage with her RW    Co-evaluation               End of Session Equipment Utilized During Treatment: Gait belt Activity Tolerance: Patient tolerated treatment well Patient left: in chair;with call bell/phone within reach;with family/visitor present Nurse Communication: Mobility status         Time: 1403-1430 PT Time Calculation (min) (ACUTE ONLY): 27 min   Charges:   PT Evaluation $Initial PT Evaluation Tier I: 1 Procedure PT Treatments $Gait Training: 8-22 mins   PT G Codes:        Rolinda Roan 07-11-14, 2:48 PM   Rolinda Roan, PT, DPT Acute Rehabilitation Services Pager: [phone]

## 2014-07-10 DIAGNOSIS — I639 Cerebral infarction, unspecified: Secondary | ICD-10-CM

## 2014-07-10 HISTORY — DX: Cerebral infarction, unspecified: I63.9

## 2014-07-10 LAB — URINE CULTURE

## 2014-07-10 LAB — GLUCOSE, CAPILLARY
GLUCOSE-CAPILLARY: 126 mg/dL — AB (ref 70–99)
GLUCOSE-CAPILLARY: 105 mg/dL — AB (ref 70–99)
GLUCOSE-CAPILLARY: 109 mg/dL — AB (ref 70–99)
Glucose-Capillary: 100 mg/dL — ABNORMAL HIGH (ref 70–99)

## 2014-07-10 MED ORDER — MECLIZINE HCL 25 MG PO TABS
25.0000 mg | ORAL_TABLET | Freq: Three times a day (TID) | ORAL | Status: DC
  Administered 2014-07-10 – 2014-07-11 (×3): 25 mg via ORAL
  Filled 2014-07-10 (×6): qty 1

## 2014-07-10 NOTE — Progress Notes (Signed)
[facility] PROGRESS NOTE  NASRA COUNCE NTI:[id number] DOB: 02-Aug-1945 DOA: 07/07/2014 PCP: [name], MD  Summary 69 year old white female with recent coronary artery bypass graft and stroke who presents with dizziness which has been present and worsening since her surgery. She was found to have a urinary tract infection. Also orthostatic hypotension.  Assessment/Plan:  Principal Problem:   UTI (lower urinary tract infection):  Culture growing greater than 100,000 colonies of gram-negative rods. await results. Patient reports continued orthostatic symptoms and poor appetite. Continue rocephin and f/u results  Active Problems:   Dizziness:  Likely multifactorial: UTI, orthostatic hypotension, deconditioning. Already on midodrine. Continue TED hose.  PT rec home PT and 24 hour supervision. Continue IV fluids.  Needs OOB more    Diabetes mellitus type 2, uncontrolled:  Oral hypoglycemics held. Continue sliding scale for now.    Possible Sepsis:  Vitals stable.    Nausea and vomiting, poor by mouth intake: Likely from UTI. Not eating and nauseated. Pt thinks stopping meclizine made her worse. Will resume.    Generalized weakness    S/P CABG x 3, April 16    Atrial fibrillation    CVA (cerebral infarction), post CABG  Orthostatic hypotension: See above  Code Status:  full Family Communication:  Son at bedside Disposition Plan:  Home with home health services once culture results back and patient has worked with physical therapy is eating and feeling better.  Consultants:    Procedures:     Antibiotics:  Cipro 4/29-4/30  Ceftriaxone 4/30  HPI/Subjective: orthostatic dizziness slightly better, but very nauseated.  Not eating.  Objective: Filed Vitals:   07/10/14 1436  BP: 173/77  Pulse: 71  Temp: 97.5 F (36.4 C)  Resp: 16    Intake/Output Summary (Last 24 hours) at 07/10/14 1541 Last data filed at 07/10/14 0518  Gross per 24 hour  Intake       0 ml  Output   1000 ml  Net  -1000 ml   Filed Weights   07/07/14 2156 07/08/14 0156 07/10/14 0719  Weight: 63.504 kg (140 lb) 62.551 kg (137 lb 14.4 oz) 62.4 kg (137 lb 9.1 oz)    Exam:   General:  Uncomfortable. Cold rag on her head  HEENT: Moist mucous membranes  Cardiovascular: Regular rate rhythm without murmurs gallops rubs  Respiratory: Clear to auscultation bilaterally without wheezes rhonchi or rales  Abdomen: Soft nontender nondistended  Ext: No clubbing cyanosis or edema  Basic Metabolic Panel:  Recent Labs Lab 07/07/14 2210 07/08/14 0500 07/09/14 0403  NA 137 135 135  K 4.2 3.6 3.5  CL 103 104 107  CO2 18* 19 18*  GLUCOSE 195* 212* 223*  BUN 15 14 11   CREATININE 1.20* 0.95 0.97  CALCIUM 9.8 8.9 8.4  MG  --  1.6  --   PHOS  --  4.2  --    Liver Function Tests:  Recent Labs Lab 07/08/14 0500 07/09/14 0403  AST 20 19  ALT 13 12  ALKPHOS 80 76  BILITOT 0.8 0.5  PROT 6.7 5.9*  ALBUMIN 3.3* 2.8*   No results for input(s): LIPASE, AMYLASE in the last 168 hours. No results for input(s): AMMONIA in the last 168 hours. CBC:  Recent Labs Lab 07/07/14 2210 07/08/14 0500 07/09/14 0403  WBC 12.8* 7.2 6.5  NEUTROABS  --  4.3  --   HGB 13.2 11.7* 10.8*  HCT 37.4 35.0* 31.9*  MCV 87.8 90.2 89.9  PLT 458* 354 302  Cardiac Enzymes:  Recent Labs Lab 07/08/14 1302 07/08/14 1806  TROPONINI 0.03 0.03   BNP (last 3 results)  Recent Labs  07/07/14 2210  BNP 125.5*    ProBNP (last 3 results) No results for input(s): PROBNP in the last 8760 hours.  CBG:  Recent Labs Lab 07/09/14 1120 07/09/14 1656 07/09/14 2114 07/10/14 0607 07/10/14 1128  GLUCAP 229* 105* 122* 100* 126*    Recent Results (from the past 240 hour(s))  Blood Culture (routine x 2)     Status: None (Preliminary result)   Collection Time: 07/07/14 10:10 PM  Result Value Ref Range Status   Specimen Description BLOOD LEFT ARM  Final   Special Requests BOTTLES DRAWN  AEROBIC AND ANAEROBIC 6CC  Final   Culture   Final           BLOOD CULTURE RECEIVED NO GROWTH TO DATE CULTURE WILL BE HELD FOR 5 DAYS BEFORE ISSUING A FINAL NEGATIVE REPORT Performed at [facility]    Report Status PENDING  Incomplete  Blood Culture (routine x 2)     Status: None (Preliminary result)   Collection Time: 07/07/14 10:10 PM  Result Value Ref Range Status   Specimen Description BLOOD RIGHT ARM  Final   Special Requests BOTTLES DRAWN AEROBIC ONLY 10CC  Final   Culture   Final           BLOOD CULTURE RECEIVED NO GROWTH TO DATE CULTURE WILL BE HELD FOR 5 DAYS BEFORE ISSUING A FINAL NEGATIVE REPORT Performed at [facility]    Report Status PENDING  Incomplete  Urine culture     Status: None (Preliminary result)   Collection Time: 07/07/14 11:35 PM  Result Value Ref Range Status   Specimen Description URINE, RANDOM  Final   Special Requests NONE  Final   Colony Count   Final    >=100,000 COLONIES/ML Performed at [facility]    Culture   Final    [place] Performed at [facility]    Report Status PENDING  Incomplete     Studies: Ct Head Wo Contrast  07/08/2014   CLINICAL DATA:  Dizzy, nausea lightheadedness.  Generalize weakness.  EXAM: CT HEAD WITHOUT CONTRAST  TECHNIQUE: Contiguous axial images were obtained from the base of the skull through the vertex without intravenous contrast.  COMPARISON:  03/27/2014  FINDINGS: There is prominence of the sulci and ventricles consistent with brain atrophy. No acute cortical infarct, hemorrhage, or mass lesion ispresent. No significant extra-axial fluid collection is present. Partial opacification of the right maxillary sinus noted. The remaining paranasal sinuses and mastoid air cells are clear. The skull is intact.  IMPRESSION: 1. No acute intracranial abnormalities. 2. Mild brain atrophy.   Electronically Signed   By: Kerby Moors M.D.   On: 07/08/2014 15:54    Scheduled Meds: .  aspirin EC  81 mg Oral Daily  . atorvastatin  20 mg Oral QHS  . cefTRIAXone (ROCEPHIN)  IV  1 g Intravenous Q24H  . clopidogrel  75 mg Oral Daily  . famotidine  20 mg Oral QHS  . feeding supplement (ENSURE ENLIVE)  237 mL Oral BID BM  . folic acid  1 mg Oral Daily  . gabapentin  100 mg Oral TID  . heparin  5,000 Units Subcutaneous 3 times per day  . insulin aspart  0-15 Units Subcutaneous TID WC  . insulin aspart  0-5 Units Subcutaneous QHS  . insulin detemir  5 Units  Subcutaneous QHS  . meclizine  25 mg Oral TID  . midodrine  10 mg Oral 3 times per day  . sertraline  50 mg Oral Daily  . sodium chloride  3 mL Intravenous Q12H   Continuous Infusions: . sodium chloride 100 mL/hr at 07/10/14 0230    Time spent: 35 minutes  [facility] Pager [phone]. If 7PM-7AM, please contact night-coverage at [email or web address], password [facility] 07/10/2014, 3:41 PM  LOS: 2 days

## 2014-07-11 ENCOUNTER — Inpatient Hospital Stay (HOSPITAL_COMMUNITY): Payer: Medicare Other

## 2014-07-11 DIAGNOSIS — I951 Orthostatic hypotension: Secondary | ICD-10-CM | POA: Insufficient documentation

## 2014-07-11 DIAGNOSIS — I509 Heart failure, unspecified: Secondary | ICD-10-CM

## 2014-07-11 LAB — BASIC METABOLIC PANEL
Anion gap: 7 (ref 5–15)
CO2: 23 mmol/L (ref 22–32)
Calcium: 8.4 mg/dL — ABNORMAL LOW (ref 8.9–10.3)
Chloride: 109 mmol/L (ref 101–111)
Creatinine, Ser: 1.07 mg/dL — ABNORMAL HIGH (ref 0.44–1.00)
GFR calc non Af Amer: 51 mL/min — ABNORMAL LOW (ref >60)
GFR, EST AFRICAN AMERICAN: 59 mL/min — AB (ref >60)
Glucose, Bld: 86 mg/dL (ref 70–99)
Potassium: 3.5 mmol/L (ref 3.5–5.1)
SODIUM: 139 mmol/L (ref 135–145)

## 2014-07-11 LAB — CBC
HCT: 33 % — ABNORMAL LOW (ref 36.0–46.0)
Hemoglobin: 11.4 g/dL — ABNORMAL LOW (ref 12.0–15.0)
MCH: 30.7 pg (ref 26.0–34.0)
MCHC: 34.5 g/dL (ref 30.0–36.0)
MCV: 88.9 fL (ref 78.0–100.0)
PLATELETS: 294 10*3/uL (ref 150–400)
RBC: 3.71 MIL/uL — AB (ref 3.87–5.11)
RDW: 14.2 % (ref 11.5–15.5)
WBC: 6.9 10*3/uL (ref 4.0–10.5)

## 2014-07-11 LAB — GLUCOSE, CAPILLARY
GLUCOSE-CAPILLARY: 85 mg/dL (ref 70–99)
GLUCOSE-CAPILLARY: 114 mg/dL — AB (ref 70–99)
Glucose-Capillary: 104 mg/dL — ABNORMAL HIGH (ref 70–99)
Glucose-Capillary: 77 mg/dL (ref 70–99)

## 2014-07-11 LAB — CORTISOL: Cortisol, Plasma: 12.9 ug/dL

## 2014-07-11 MED ORDER — ONDANSETRON HCL 4 MG/2ML IJ SOLN
4.0000 mg | Freq: Four times a day (QID) | INTRAMUSCULAR | Status: DC
Start: 2014-07-11 — End: 2014-07-13
  Administered 2014-07-11 – 2014-07-13 (×6): 4 mg via INTRAVENOUS
  Filled 2014-07-11 (×6): qty 2

## 2014-07-11 MED ORDER — ONDANSETRON 4 MG PO TBDP: 4.0000 mg | ORAL_TABLET | Freq: Four times a day (QID) | ORAL | Status: DC

## 2014-07-11 MED ORDER — SODIUM CHLORIDE 0.9 % IV BOLUS (SEPSIS)
500.0000 mL | Freq: Once | INTRAVENOUS | Status: AC
  Administered 2014-07-11: 500 mL via INTRAVENOUS

## 2014-07-11 MED ORDER — ONDANSETRON HCL 4 MG PO TABS
4.0000 mg | ORAL_TABLET | Freq: Four times a day (QID) | ORAL | Status: DC
  Administered 2014-07-12 (×2): 4 mg via ORAL
  Filled 2014-07-11 (×10): qty 1

## 2014-07-11 NOTE — Progress Notes (Signed)
Medicare Important Message given? YES  (If response is "NO", the following Medicare IM given date fields will be blank)  Date Medicare IM given: 07/11/14 Medicare IM given by:  Hye Trawick  

## 2014-07-11 NOTE — Progress Notes (Signed)
2nd NS bolus complete at this time; bedside echo being done at this time, unable to do orthostatic VS; will complete once echo is complete; will cont. To monitor.

## 2014-07-11 NOTE — Progress Notes (Signed)
Utilization review completed.  

## 2014-07-11 NOTE — Progress Notes (Signed)
[facility] Progress Note   [name] XBL:[id number] DOB: [birth year] DOA: 07/07/2014 PCP: [name], MD  Brief narrative: Maria Fox is a 69 y.o. female with PMH of hypertension, diabetes mellitus, migraines who recently underwent CABG 3 after an NSTEMI on 4/7 in [place] where she was visiting at the time.she was discharged to a rehabilitation facility and then discharged to home this past Tuesday. She claims to be nauseated and lightheaded ever since her CABG and was admitted for complaints of nausea and dizziness on 4/28. She continues to have severe nausea to the point where she is barely eating. She was noted to be orthostatic and despite receiving fluids in the hospital she continues to have positive orthostatic vitals. Per H&P, the patient was previously on amiodarone, Coreg and lisinopril which were discontinued due to her complaints of dizziness.  Subjective: Continues to be severely nauseated and is dizzy when standing - orthostatic vitals are noted to be positive. She does not feel the room is spinning but mostly is lightheaded. No complaints of chest pain shortness of breath abdominal pain diarrhea or constipation.  Assessment/Plan: Principal Problem:   Nausea and vomiting - symptoms occurring after CABG per pateint -this is the primary cause for her orthostasis-etiology undetermined at this time-I have reviewed the new medications that she was discharged home with after her CABG and I'm doubtful that any of these will predispose her to nausea.-Many medications are on hold. -We'll change Zofran to every 6 hours routine & continue Pepcid -question whether nausea and vomiting may be related to gastroparesis in relation to uncontrolled diabetes  -If symptoms do not approved by tomorrow, may need a gastric emptying study and a trial of Reglan  Active Problems:   Orthostatic hypotension -I have noted that she was sent home after her CABG with midodrine and I  suspect that the diagnosis of orthostatic hypotension was made while she was admitted for the procedure -At this time she is clearly hypovolemic due to poor oral intake and I'm aggressively replacing her fluids -We'll check a baseline cortisol today -If orthostatic symptoms do not improve after aggressive hydration, will order a cosyntropin stimulation test to assess for adrenal insufficiency -Hold midodrine for now    Diabetes mellitus type 2, uncontrolled- is not on insulin at home -last A1c on 4/29 was 8.2 but previously her A1c's were uncontrolled at 11 -sugars are currently controlled on low-dose Levemir and sliding scale insulin -Tradjenta and metformin on hold    UTI (lower urinary tract infection) -culture growing Klebsiella pneumonia which is pansensitive-she initially received ciprofloxacin and then Rocephin-  -  will discontinue antibiotics today as she is had a full 3 days of treatment    S/P CABG x 3, April 16 - continue aspirin Plavix Lipitor  NSVT - had an episode when admitted in 1/16 - currently in sinus rhythm   Appt with PCP: requested Code Status: Full code Family Communication: discussed lab results, imaging results and plan with patient in detail Disposition Plan: to be determined DVT prophylaxis: heparin Consultants: Procedures:  Antibiotics: Anti-infectives    Start     Dose/Rate Route Frequency Ordered Stop   07/09/14 1430  cefTRIAXone (ROCEPHIN) 1 g in dextrose 5 % 50 mL IVPB - Premix  Status:  Discontinued    Comments:  Aware of PCN allergy   1 g 100 mL/hr over 30 Minutes Intravenous Every 24 hours 07/09/14 1333 07/11/14 1146   07/08/14 0200  ciprofloxacin (CIPRO) IVPB 400 mg  Status:  Discontinued     400 mg 200 mL/hr over 60 Minutes Intravenous Every 12 hours 07/08/14 0158 07/09/14 1333      Objective: Filed Weights   07/08/14 0156 07/10/14 0719 07/11/14 2542  Weight: 62.551 kg (137 lb 14.4 oz) 62.4 kg (137 lb 9.1 oz) 62.1 kg (136 lb 14.5  oz)    Intake/Output Summary (Last 24 hours) at 07/11/14 1152 Last data filed at 07/11/14 1145  Gross per 24 hour  Intake   1300 ml  Output   2350 ml  Net  -1050 ml     Vitals Filed Vitals:   07/10/14 1436 07/11/14 0633 07/11/14 0726 07/11/14 0731  BP: 173/77 133/72    Pulse: 71 88    Temp: 97.5 F (36.4 C) 97.7 F (36.5 C) 97.5 F (36.4 C)   TempSrc: Oral Oral Oral   Resp: 16 16    Height:    5\' 3"  (1.6 m)  Weight:  62.1 kg (136 lb 14.5 oz)    SpO2: 98% 95%      Exam:  General:  Pt is alert, not in acute distress  HEENT: No icterus, No thrush  Cardiovascular: regular rate and rhythm, S1/S2 No murmur  Respiratory: clear to auscultation bilaterally   Abdomen: Soft, +Bowel sounds, non tender, non distended, no guarding  MSK: No LE edema, cyanosis or clubbing  Data Reviewed: Basic Metabolic Panel:  Recent Labs Lab 07/07/14 2210 07/08/14 0500 07/09/14 0403 07/11/14 0424  NA 137 135 135 139  K 4.2 3.6 3.5 3.5  CL 103 104 107 109  CO2 18* 19 18* 23  GLUCOSE 195* 212* 223* 86  BUN 15 14 11  <5*  CREATININE 1.20* 0.95 0.97 1.07*  CALCIUM 9.8 8.9 8.4 8.4*  MG  --  1.6  --   --   PHOS  --  4.2  --   --    Liver Function Tests:  Recent Labs Lab 07/08/14 0500 07/09/14 0403  AST 20 19  ALT 13 12  ALKPHOS 80 76  BILITOT 0.8 0.5  PROT 6.7 5.9*  ALBUMIN 3.3* 2.8*   No results for input(s): LIPASE, AMYLASE in the last 168 hours. No results for input(s): AMMONIA in the last 168 hours. CBC:  Recent Labs Lab 07/07/14 2210 07/08/14 0500 07/09/14 0403 07/11/14 0424  WBC 12.8* 7.2 6.5 6.9  NEUTROABS  --  4.3  --   --   HGB 13.2 11.7* 10.8* 11.4*  HCT 37.4 35.0* 31.9* 33.0*  MCV 87.8 90.2 89.9 88.9  PLT 458* 354 302 294   Cardiac Enzymes:  Recent Labs Lab 07/08/14 1302 07/08/14 1806  TROPONINI 0.03 0.03   BNP (last 3 results)  Recent Labs  07/07/14 2210  BNP 125.5*    ProBNP (last 3 results) No results for input(s): PROBNP in the last  8760 hours.  CBG:  Recent Labs Lab 07/10/14 1128 07/10/14 1626 07/10/14 2147 07/11/14 0627 07/11/14 1057  GLUCAP 126* 105* 109* 85 114*    Recent Results (from the past 240 hour(s))  Blood Culture (routine x 2)     Status: None (Preliminary result)   Collection Time: 07/07/14 10:10 PM  Result Value Ref Range Status   Specimen Description BLOOD LEFT ARM  Final   Special Requests BOTTLES DRAWN AEROBIC AND ANAEROBIC 6CC  Final   Culture   Final           BLOOD CULTURE RECEIVED NO GROWTH TO DATE CULTURE WILL BE HELD FOR 5 DAYS  BEFORE ISSUING A FINAL NEGATIVE REPORT Performed at [facility]    Report Status PENDING  Incomplete  Blood Culture (routine x 2)     Status: None (Preliminary result)   Collection Time: 07/07/14 10:10 PM  Result Value Ref Range Status   Specimen Description BLOOD RIGHT ARM  Final   Special Requests BOTTLES DRAWN AEROBIC ONLY 10CC  Final   Culture   Final           BLOOD CULTURE RECEIVED NO GROWTH TO DATE CULTURE WILL BE HELD FOR 5 DAYS BEFORE ISSUING A FINAL NEGATIVE REPORT Performed at [facility]    Report Status PENDING  Incomplete  Urine culture     Status: None   Collection Time: 07/07/14 11:35 PM  Result Value Ref Range Status   Specimen Description URINE, RANDOM  Final   Special Requests NONE  Final   Colony Count   Final    >=100,000 COLONIES/ML Performed at [facility]    Culture   Final    KLEBSIELLA PNEUMONIAE Performed at [facility]    Report Status 07/10/2014 FINAL  Final   Organism ID, Bacteria KLEBSIELLA PNEUMONIAE  Final      Susceptibility   Klebsiella pneumoniae - MIC*    AMPICILLIN RESISTANT      CEFAZOLIN <=4 SENSITIVE Sensitive     CEFTRIAXONE <=1 SENSITIVE Sensitive     CIPROFLOXACIN <=0.25 SENSITIVE Sensitive     GENTAMICIN <=1 SENSITIVE Sensitive     LEVOFLOXACIN <=0.12 SENSITIVE Sensitive     NITROFURANTOIN 32 SENSITIVE Sensitive     TOBRAMYCIN <=1 SENSITIVE Sensitive      TRIMETH/SULFA <=20 SENSITIVE Sensitive     PIP/TAZO <=4 SENSITIVE Sensitive     * KLEBSIELLA PNEUMONIAE     Studies: No results found.  Scheduled Meds:  Scheduled Meds: . aspirin EC  81 mg Oral Daily  . atorvastatin  20 mg Oral QHS  . clopidogrel  75 mg Oral Daily  . famotidine  20 mg Oral QHS  . feeding supplement (ENSURE ENLIVE)  237 mL Oral BID BM  . folic acid  1 mg Oral Daily  . gabapentin  100 mg Oral TID  . heparin  5,000 Units Subcutaneous 3 times per day  . insulin aspart  0-15 Units Subcutaneous TID WC  . insulin aspart  0-5 Units Subcutaneous QHS  . insulin detemir  5 Units Subcutaneous QHS  . ondansetron (ZOFRAN) IV  4 mg Intravenous 4 times per day   Or  . ondansetron  4 mg Oral 4 times per day  . sertraline  50 mg Oral Daily  . sodium chloride  3 mL Intravenous Q12H   Continuous Infusions: . sodium chloride 100 mL/hr at 07/11/14 0024    Time spent on care of this patient: 89 min   Jeff[place], MD 07/11/2014, 11:52 AM  LOS: 3 days   [facility] Office  [phone] Pager - Text Page per [email or web address]  If 7PM-7AM, please contact night-coverage [email or web address]

## 2014-07-11 NOTE — Progress Notes (Signed)
  Echocardiogram 2D Echocardiogram has been performed.  Diamond Nickel 07/11/2014, 12:45 PM

## 2014-07-11 NOTE — Evaluation (Addendum)
Occupational Therapy Evaluation Patient Details Name: Maria Fox MRN: [id number] DOB: [birth year] Today's Date: 07/11/2014    History of Present Illness Pt is a 69 y.o. female admitted with dizziness and nausea. Pt recently underwent a CABG that was complicated by a CVA. Pt discharged to SNF, returned home and later in the week returned to the ED with above listed symptoms and hypotension.    Clinical Impression   Pt admitted with above. Pt recently returned home from SNF. Feel pt will benefit from acute OT to increase independence prior to d/c. **See vital section below for BP.    Follow Up Recommendations  No OT follow up;Supervision/Assistance - 24 hour    Equipment Recommendations  None recommended by OT    Recommendations for Other Services       Precautions / Restrictions Precautions Precautions: Fall;Sternal Restrictions Weight Bearing Restrictions:  (sternal precautions)      Mobility Bed Mobility Overal bed mobility: Needs Assistance Bed Mobility: Sit to Supine;Supine to Sit     Supine to sit: Mod assist Sit to supine: Supervision   General bed mobility comments: assist with trunk to go from supine to sitting position and cues for technique. Assist to scoot [facility] and cues for technique.   Transfers Overall transfer level: Needs assistance   Transfers: Sit to/from Stand Sit to Stand: Min guard         General transfer comment: Min guard for safety    Balance  No LOB in session-balance not formally assessed.                                          ADL Overall ADL's : Needs assistance/impaired     Grooming: Wash/dry face;Set up;Sitting;Supervision/safety           Upper Body Dressing : Set up;Supervision/safety;Sitting   Lower Body Dressing: Min guard;Sit to/from stand   Toilet Transfer: Min guard (sit to stand from bed)           Functional mobility during ADLs:  (not assessed) General ADL Comments: Verbally  discussed tub transfer technique using tub bench and pt verbalized she knows how to do this. Reviewed sternal precautions. Pt able to don/doff socks sitting EOB. Pt did not feel like she could safety ambulate after standing due to lightheadedness and feeling as she was going to faint.     Vision     Perception     Praxis      Pertinent Vitals/Pain Pain Assessment: 0-10 Pain Score: 7  Pain Location: head Pain Descriptors / Indicators: Headache Pain Intervention(s): Monitored during session   **BP 161/70 in supine; 133/72 sitting EOB; and 129/59 when standing.     Hand Dominance Right   Extremity/Trunk Assessment Upper Extremity Assessment Upper Extremity Assessment: Overall WFL for tasks assessed   Lower Extremity Assessment Lower Extremity Assessment: Defer to PT evaluation       Communication Communication Communication: No difficulties   Cognition Arousal/Alertness: Awake/alert Behavior During Therapy: WFL for tasks assessed/performed Overall Cognitive Status: Within Functional Limits for tasks assessed                     General Comments       Exercises       Shoulder Instructions      Home Living Family/patient expects to be discharged to:: Private residence Living Arrangements: Other relatives Available Help at  Discharge: Family;Available 24 hours/day Type of Home: House Home Access: Stairs to enter [employer] of Steps: 2   Home Layout: One level     Bathroom Shower/Tub: Teacher, early years/pre: Standard     Home Equipment: Environmental consultant - 2 wheels (access to tub bench and has bought grab bars)          Prior Functioning/Environment Level of Independence: Needs assistance  Gait / Transfers Assistance Needed: Some assist required for ambulation with walker at rehab ADL's / Homemaking Assistance Needed: Initially assisted with bathing and dressing however was able to do most of it on her own by the end of rehab         OT Diagnosis: Generalized weakness;Acute pain   OT Problem List: Decreased strength;Decreased activity tolerance;Decreased knowledge of use of DME or AE;Decreased knowledge of precautions;Pain   OT Treatment/Interventions: Self-care/ADL training;DME and/or AE instruction;Therapeutic activities;Patient/family education;Balance training;Therapeutic exercise    OT Goals(Current goals can be found in the care plan section) Acute Rehab OT Goals Patient Stated Goal: not stated OT Goal Formulation: With patient Time For Goal Achievement: 07/18/14 Potential to Achieve Goals: Good ADL Goals Pt Will Perform Grooming: with set-up;standing Pt Will Perform Lower Body Bathing: with set-up;sit to/from stand Pt Will Perform Lower Body Dressing: with set-up;sit to/from stand Pt Will Transfer to Toilet: with modified independence;ambulating;regular height toilet  OT Frequency: Min 2X/week   Barriers to D/C:            Co-evaluation              End of Session Equipment Utilized During Treatment: Gait belt Nurse Communication: Other (comment);Mobility status (BP)  Activity Tolerance: Other (comment) (lightheaded) Patient left: in bed;with call bell/phone within reach;with family/visitor present   Time: 1550-1610 OT Time Calculation (min): 20 min Charges:  OT General Charges $OT Visit: 1 Procedure OT Evaluation $Initial OT Evaluation Tier I: 1 Procedure G-CodesBenito Mccreedy OTR/L [phone] 07/11/2014, 4:25 PM

## 2014-07-11 NOTE — Care Management Note (Addendum)
    Page 1 of 1   07/19/2014     12:38:29 PM CARE MANAGEMENT NOTE 07/19/2014  Patient:  Maria Fox, Maria Fox   Account Number:  [id number]  Date Initiated:  07/11/2014  Documentation initiated by:  Marvetta Gibbons  Subjective/Objective Assessment:   Pt admitted with UTI     Action/Plan:   PTA pt lived at home with friend 24/7 supervision, has walker   Anticipated DC Date:  07/14/2014   Anticipated DC Plan:  [place]  CM consult  Outpatient Services - Pt will follow up      Choice offered to / List presented to:  C-1 Patient           Status of service:  Completed, signed off Medicare Important Message given?  YES (If response is "NO", the following Medicare IM given date fields will be blank) Date Medicare IM given:  07/11/2014 Medicare IM given by:  Marvetta Gibbons Date Additional Medicare IM given:  07/15/2014 Additional Medicare IM given by:  Elenor Quinones  Discharge Disposition:  HOME/SELF CARE  Per UR Regulation:  Reviewed for med. necessity/level of care/duration of stay  If discussed at [place] of Stay Meetings, dates discussed:   07/14/2014    Comments:  07/15/14 [place]; Pt refused HH due to "my home is not safe for people to come in", when CM asked for additonal information, pt stated the home was under construction and they she did not want people in home.  Pt requested to attend an outpt facility for rehab, pt chose [facility] facility [phone].  Since pt refused HH; aide order can not be fulfilled, MD made aware.  CM contacted MD and asked if order could be modified from [facility] to outp services, MD agreed.  CM sent referral via Epic to center per pt request.  CM called to pt room and informed that referral had been sent, follow-up provided on AVS.

## 2014-07-11 NOTE — Progress Notes (Signed)
Physical Therapy Treatment Patient Details Name: Maria Fox MRN: [id number] DOB: 1945-10-15 Today's Date: 07/11/2014    History of Present Illness Pt is a 69 y/o female admitted with dizziness and nausea. Pt recently underwent a CABG that was complicated by a CVA. Pt d/c'd to SNF, returned home and later in the week returned to the ED with above listed symptoms and hypotension.     PT Comments    Pt limited this session by positive orthostatics. BP in standing dropped as low as 65/37. Full orthostatic report in Vital Signs Flowsheet. Pt reports that she has been feeling continually dizzy and nauseated since yesterday and has not eaten much. Will continue to follow and progress as able per POC. Pt/family continues to plan for d/c home and not SNF.   Follow Up Recommendations  Home health PT;Supervision/Assistance - 24 hour     Equipment Recommendations  Other (comment) (Tub bench/shower seat)    Recommendations for Other Services       Precautions / Restrictions Precautions Precautions: Fall;Sternal Restrictions Weight Bearing Restrictions: No    Mobility  Bed Mobility Overal bed mobility: Needs Assistance Bed Mobility: Rolling;Sidelying to Sit;Sit to Sidelying Rolling: Supervision Sidelying to sit: Min guard     Sit to sidelying: Min guard General bed mobility comments: VC's to maintain sternal precautions. Pt demonstrated log roll well.   Transfers Overall transfer level: Needs assistance Equipment used: 1 person hand held assist Transfers: Sit to/from Stand Sit to Stand: Min assist         General transfer comment: Assist for balance and support. Pt stood EOB for 3 minutes while 2 sets of vital signs were taken. BP conitnued to drop as low as 65/37 and pt was returned to supine.   Ambulation/Gait             General Gait Details: Deferred due to positive orthostatic hypotension   Stairs            Wheelchair Mobility    Modified Rankin (Stroke  Patients Only)       Balance Overall balance assessment: Needs assistance Sitting-balance support: Feet supported;No upper extremity supported Sitting balance-Leahy Scale: Fair     Standing balance support: No upper extremity supported Standing balance-Leahy Scale: Poor Standing balance comment: Assist to maintain balance while standing EOB. Pt required increased assist as she fatigued and BP dropped                    Cognition Arousal/Alertness: Awake/alert Behavior During Therapy: Flat affect Overall Cognitive Status: Within Functional Limits for tasks assessed                      Exercises      General Comments        Pertinent Vitals/Pain Pain Assessment: No/denies pain    Home Living                      Prior Function            PT Goals (current goals can now be found in the care plan section) Acute Rehab PT Goals Patient Stated Goal: Home and not back to rehab PT Goal Formulation: With patient Time For Goal Achievement: 07/16/14 Potential to Achieve Goals: Good Progress towards PT goals: Not progressing toward goals - comment (Limited by orthostatics)    Frequency  Min 3X/week    PT Plan Current plan remains appropriate    Co-evaluation  End of Session Equipment Utilized During Treatment: Gait belt Activity Tolerance: Treatment limited secondary to medical complications (Comment) (Orthostatic hypotension) Patient left: in chair;with call bell/phone within reach;with family/visitor present     Time: 4383-8184 PT Time Calculation (min) (ACUTE ONLY): 18 min  Charges:  $Therapeutic Activity: 8-22 mins                    G Codes:      Rolinda Roan 08/09/14, 8:25 AM  Rolinda Roan, PT, DPT Acute Rehabilitation Services Pager: [phone]

## 2014-07-12 ENCOUNTER — Ambulatory Visit: Payer: Medicare Other | Admitting: Family Medicine

## 2014-07-12 DIAGNOSIS — I4891 Unspecified atrial fibrillation: Secondary | ICD-10-CM

## 2014-07-12 LAB — ACTH STIMULATION, 3 TIME POINTS
Cortisol, 30 Min: 25.7 ug/dL
Cortisol, 60 Min: 30.1 ug/dL
Cortisol, Base: 13.7 ug/dL

## 2014-07-12 LAB — GLUCOSE, CAPILLARY
GLUCOSE-CAPILLARY: 192 mg/dL — AB (ref 70–99)
GLUCOSE-CAPILLARY: 190 mg/dL — AB (ref 70–99)
Glucose-Capillary: 109 mg/dL — ABNORMAL HIGH (ref 70–99)
Glucose-Capillary: 138 mg/dL — ABNORMAL HIGH (ref 70–99)

## 2014-07-12 MED ORDER — COSYNTROPIN 0.25 MG IJ SOLR
0.2500 mg | Freq: Once | INTRAMUSCULAR | Status: AC
  Administered 2014-07-12: 0.25 mg via INTRAVENOUS
  Filled 2014-07-12: qty 0.25

## 2014-07-12 NOTE — Progress Notes (Signed)
Physical Therapy Treatment Patient Details Name: Maria Fox MRN: [id number] DOB: [birth year] Today's Date: 07/12/2014    History of Present Illness Pt is a 69 y/o female admitted with dizziness and nausea. Pt recently underwent a CABG that was complicated by a CVA. Pt d/c'd to SNF, returned home and later in the week returned to the ED with above listed symptoms and hypotension.     PT Comments    Pt continues to be limited by orthostatic hypotension. BP documented in [employer]. Once pt returned to sitting she became nauseated and dry heaved for a brief period of time. Pt declines any further mobility, asking to stay in the straight back bedside chair instead of getting into the recliner. Will continue to follow and progress mobility as able, however if pt is not able to make functional progress with PT may want to consider a higher level of care at d/c.  Follow Up Recommendations  Home health PT;Supervision/Assistance - 24 hour     Equipment Recommendations  Other (comment) (Tub bench/shower seat)    Recommendations for Other Services       Precautions / Restrictions Precautions Precautions: Fall;Sternal Restrictions Weight Bearing Restrictions: No    Mobility  Bed Mobility               General bed mobility comments: Pt was sitting up in bedside chair when PT arrived.   Transfers Overall transfer level: Needs assistance Equipment used: 1 person hand held assist Transfers: Sit to/from Stand Sit to Stand: Min guard         General transfer comment: VC's for hand placement to maintain sternal precautions. Pt stood at chair for orthostatics, and maintained 3 minute stand for second standing BP reading. Pt required hands-on guarding for balance as she fatigued and began to feel dizzy/nauseated.  Ambulation/Gait             General Gait Details: Pt declined any further ambulation at this time.    Stairs            Wheelchair Mobility     Modified Rankin (Stroke Patients Only)       Balance Overall balance assessment: Needs assistance Sitting-balance support: Feet supported;No upper extremity supported Sitting balance-Leahy Scale: Fair     Standing balance support: No upper extremity supported Standing balance-Leahy Scale: Poor Standing balance comment: Assist to maintain balance while standing at chair. Pt required increased assist as she fatigued and BP dropped                    Cognition Arousal/Alertness: Awake/alert Behavior During Therapy: Flat affect Overall Cognitive Status: Within Functional Limits for tasks assessed                      Exercises      General Comments        Pertinent Vitals/Pain Pain Assessment: No/denies pain    Home Living                      Prior Function            PT Goals (current goals can now be found in the care plan section) Acute Rehab PT Goals Patient Stated Goal: Home and not back to rehab PT Goal Formulation: With patient Time For Goal Achievement: 07/16/14 Potential to Achieve Goals: Good Progress towards PT goals: Progressing toward goals    Frequency  Min 3X/week    PT Plan Current  plan remains appropriate    Co-evaluation             End of Session Equipment Utilized During Treatment: Gait belt Activity Tolerance: Treatment limited secondary to medical complications (Comment) (Orthostatic hypotension) Patient left: in chair;with call bell/phone within reach;with family/visitor present     Time: 0810-0820 PT Time Calculation (min) (ACUTE ONLY): 10 min  Charges:  $Therapeutic Activity: 8-22 mins                    G Codes:      Rolinda Roan Jul 28, 2014, 8:28 AM  Rolinda Roan, PT, DPT Acute Rehabilitation Services Pager: [phone]

## 2014-07-12 NOTE — Progress Notes (Addendum)
[facility] Progress Note   Maria Fox TDD:[id number] DOB: 22-Apr-1945 DOA: 07/07/2014 PCP: [name], MD  Brief narrative: Maria Fox is a 69 y.o. female with PMH of hypertension, diabetes mellitus, migraines who recently underwent CABG 3 after an NSTEMI on 4/7 in [place] where she was visiting at the time.she was discharged to a rehabilitation facility and then discharged to home this past Tuesday. She claims to be nauseated and lightheaded ever since her CABG and was admitted for complaints of nausea and dizziness on 4/28. She continues to have severe nausea to the point where she is barely eating. She was noted to be orthostatic and despite receiving fluids in the hospital she continues to have positive orthostatic vitals. Per H&P, the patient was previously on amiodarone, Coreg and lisinopril which were discontinued due to her complaints of dizziness.  Subjective: Continues to be severely nauseated and is dizzy when standing - orthostatic vitals are noted to be positive. She does not feel the room is spinning but mostly is lightheaded. No complaints of chest pain shortness of breath abdominal pain diarrhea or constipation.  Assessment/Plan: Principal Problem:   Nausea and vomiting - symptoms occurring after CABG per patient -I have reviewed the new medications that she was discharged home with after her CABG and I'm doubtful that any of these will predispose her to nausea.-Many medications are on hold. -She states that symptoms have been improving and she was able to eat more solid food yesterday -Continue Zofran every 6 hours routine & continue Pepcid -question whether nausea and vomiting may be related to gastroparesis in relation to uncontrolled diabetes -it may also be related to adrenal insufficiency -may need a gastric emptying study and a trial of Reglan  Active Problems:   Orthostatic hypotension -I have noted that she was sent home after her CABG with  midodrine and I suspect that the diagnosis of orthostatic hypotension was made while she was admitted for the procedure -As she was hypovolemic, I hydrated her aggressively -however, despite being adequately hydrated she continues to be significantly orthostatic by vital signs and by symptoms -Baseline cortisol was normal-obtaining cosyntropin stimulation test today -TSH normal on 4/29- 3.457    Diabetes mellitus type 2, uncontrolled- is not on insulin at home -last A1c on 4/29 was 8.2 but previously her A1c's were uncontrolled at 11 -sugars are currently controlled on low-dose Levemir and sliding scale insulin -Tradjenta and metformin on hold    UTI (lower urinary tract infection) -culture growing Klebsiella pneumonia which is pansensitive-she initially received ciprofloxacin and then Rocephin-  -  She has had a full 3 days of treatment    S/P CABG x 3, April 16 - continue aspirin Plavix Lipitor  NSVT - had an episode when admitted in 1/16 - currently in sinus rhythm  Vitiligo   Appt with PCP: requested Code Status: Full code Family Communication: discussed lab results, imaging results and plan with patient in detail Disposition Plan: Home with home health PT DVT prophylaxis: heparin Consultants: Procedures:  Antibiotics: Anti-infectives    Start     Dose/Rate Route Frequency Ordered Stop   07/09/14 1430  cefTRIAXone (ROCEPHIN) 1 g in dextrose 5 % 50 mL IVPB - Premix  Status:  Discontinued    Comments:  Aware of PCN allergy   1 g 100 mL/hr over 30 Minutes Intravenous Every 24 hours 07/09/14 1333 07/11/14 1146   07/08/14 0200  ciprofloxacin (CIPRO) IVPB 400 mg  Status:  Discontinued  400 mg 200 mL/hr over 60 Minutes Intravenous Every 12 hours 07/08/14 0158 07/09/14 1333      Objective: Filed Weights   07/10/14 0719 07/11/14 0633 07/12/14 0456  Weight: 62.4 kg (137 lb 9.1 oz) 62.1 kg (136 lb 14.5 oz) 62.1 kg (136 lb 14.5 oz)    Intake/Output Summary (Last 24  hours) at 07/12/14 1220 Last data filed at 07/12/14 0814  Gross per 24 hour  Intake 2351.67 ml  Output   1800 ml  Net 551.67 ml     Vitals Filed Vitals:   07/11/14 1406 07/11/14 2002 07/12/14 0456 07/12/14 0810  BP:   142/64   Pulse:      Temp:  97.8 F (36.6 C) 97.8 F (36.6 C)   TempSrc:  Oral Oral   Resp:  18 18 21   Height:      Weight:   62.1 kg (136 lb 14.5 oz)   SpO2: 98% 97% 93% 98%    Exam:  General:  Pt is alert, not in acute distress  HEENT: No icterus, No thrush  Cardiovascular: regular rate and rhythm, S1/S2 No murmur  Respiratory: clear to auscultation bilaterally   Abdomen: Soft, +Bowel sounds, non tender, non distended, no guarding  MSK: No LE edema, cyanosis or clubbing  Data Reviewed: Basic Metabolic Panel:  Recent Labs Lab 07/07/14 2210 07/08/14 0500 07/09/14 0403 07/11/14 0424  NA 137 135 135 139  K 4.2 3.6 3.5 3.5  CL 103 104 107 109  CO2 18* 19 18* 23  GLUCOSE 195* 212* 223* 86  BUN 15 14 11  <5*  CREATININE 1.20* 0.95 0.97 1.07*  CALCIUM 9.8 8.9 8.4 8.4*  MG  --  1.6  --   --   PHOS  --  4.2  --   --    Liver Function Tests:  Recent Labs Lab 07/08/14 0500 07/09/14 0403  AST 20 19  ALT 13 12  ALKPHOS 80 76  BILITOT 0.8 0.5  PROT 6.7 5.9*  ALBUMIN 3.3* 2.8*   No results for input(s): LIPASE, AMYLASE in the last 168 hours. No results for input(s): AMMONIA in the last 168 hours. CBC:  Recent Labs Lab 07/07/14 2210 07/08/14 0500 07/09/14 0403 07/11/14 0424  WBC 12.8* 7.2 6.5 6.9  NEUTROABS  --  4.3  --   --   HGB 13.2 11.7* 10.8* 11.4*  HCT 37.4 35.0* 31.9* 33.0*  MCV 87.8 90.2 89.9 88.9  PLT 458* 354 302 294   Cardiac Enzymes:  Recent Labs Lab 07/08/14 1302 07/08/14 1806  TROPONINI 0.03 0.03   BNP (last 3 results)  Recent Labs  07/07/14 2210  BNP 125.5*    ProBNP (last 3 results) No results for input(s): PROBNP in the last 8760 hours.  CBG:  Recent Labs Lab 07/11/14 1057 07/11/14 1624  07/11/14 2144 07/12/14 0610 07/12/14 1117  GLUCAP 114* 104* 77 109* 138*    Recent Results (from the past 240 hour(s))  Blood Culture (routine x 2)     Status: None (Preliminary result)   Collection Time: 07/07/14 10:10 PM  Result Value Ref Range Status   Specimen Description BLOOD LEFT ARM  Final   Special Requests BOTTLES DRAWN AEROBIC AND ANAEROBIC 6CC  Final   Culture   Final           BLOOD CULTURE RECEIVED NO GROWTH TO DATE CULTURE WILL BE HELD FOR 5 DAYS BEFORE ISSUING A FINAL NEGATIVE REPORT Performed at [facility]    Report Status  PENDING  Incomplete  Blood Culture (routine x 2)     Status: None (Preliminary result)   Collection Time: 07/07/14 10:10 PM  Result Value Ref Range Status   Specimen Description BLOOD RIGHT ARM  Final   Special Requests BOTTLES DRAWN AEROBIC ONLY 10CC  Final   Culture   Final           BLOOD CULTURE RECEIVED NO GROWTH TO DATE CULTURE WILL BE HELD FOR 5 DAYS BEFORE ISSUING A FINAL NEGATIVE REPORT Performed at [facility]    Report Status PENDING  Incomplete  Urine culture     Status: None   Collection Time: 07/07/14 11:35 PM  Result Value Ref Range Status   Specimen Description URINE, RANDOM  Final   Special Requests NONE  Final   Colony Count   Final    >=100,000 COLONIES/ML Performed at [facility]    Culture   Final    KLEBSIELLA PNEUMONIAE Performed at [facility]    Report Status 07/10/2014 FINAL  Final   Organism ID, Bacteria KLEBSIELLA PNEUMONIAE  Final      Susceptibility   Klebsiella pneumoniae - MIC*    AMPICILLIN RESISTANT      CEFAZOLIN <=4 SENSITIVE Sensitive     CEFTRIAXONE <=1 SENSITIVE Sensitive     CIPROFLOXACIN <=0.25 SENSITIVE Sensitive     GENTAMICIN <=1 SENSITIVE Sensitive     LEVOFLOXACIN <=0.12 SENSITIVE Sensitive     NITROFURANTOIN 32 SENSITIVE Sensitive     TOBRAMYCIN <=1 SENSITIVE Sensitive     TRIMETH/SULFA <=20 SENSITIVE Sensitive     PIP/TAZO <=4 SENSITIVE  Sensitive     * KLEBSIELLA PNEUMONIAE     Studies: No results found.  Scheduled Meds:  Scheduled Meds: . aspirin EC  81 mg Oral Daily  . atorvastatin  20 mg Oral QHS  . clopidogrel  75 mg Oral Daily  . famotidine  20 mg Oral QHS  . feeding supplement (ENSURE ENLIVE)  237 mL Oral BID BM  . folic acid  1 mg Oral Daily  . gabapentin  100 mg Oral TID  . heparin  5,000 Units Subcutaneous 3 times per day  . insulin aspart  0-15 Units Subcutaneous TID WC  . insulin aspart  0-5 Units Subcutaneous QHS  . insulin detemir  5 Units Subcutaneous QHS  . ondansetron (ZOFRAN) IV  4 mg Intravenous 4 times per day   Or  . ondansetron  4 mg Oral 4 times per day  . sertraline  50 mg Oral Daily  . sodium chloride  3 mL Intravenous Q12H   Continuous Infusions: . sodium chloride 100 mL/hr at 07/12/14 2263    Time spent on care of this patient: 35 min   [place], MD 07/12/2014, 12:20 PM  LOS: 4 days   [facility] Office  [phone] Pager - Text Page per [email or web address]  If 7PM-7AM, please contact night-coverage [email or web address]

## 2014-07-13 ENCOUNTER — Inpatient Hospital Stay (HOSPITAL_COMMUNITY): Payer: Medicare Other

## 2014-07-13 DIAGNOSIS — I63512 Cerebral infarction due to unspecified occlusion or stenosis of left middle cerebral artery: Secondary | ICD-10-CM

## 2014-07-13 LAB — GLUCOSE, CAPILLARY
GLUCOSE-CAPILLARY: 120 mg/dL — AB (ref 70–99)
GLUCOSE-CAPILLARY: 124 mg/dL — AB (ref 70–99)
Glucose-Capillary: 179 mg/dL — ABNORMAL HIGH (ref 70–99)
Glucose-Capillary: 97 mg/dL (ref 70–99)

## 2014-07-13 LAB — BASIC METABOLIC PANEL
Anion gap: 9 (ref 5–15)
BUN: <5 mg/dL — ABNORMAL LOW (ref 6–20)
CALCIUM: 8.6 mg/dL — AB (ref 8.9–10.3)
CO2: 22 mmol/L (ref 22–32)
Chloride: 109 mmol/L (ref 101–111)
Creatinine, Ser: 0.95 mg/dL (ref 0.44–1.00)
GFR, EST NON AFRICAN AMERICAN: 59 mL/min — AB (ref >60)
GLUCOSE: 166 mg/dL — AB (ref 70–99)
Potassium: 3.7 mmol/L (ref 3.5–5.1)
Sodium: 140 mmol/L (ref 135–145)

## 2014-07-13 LAB — T4, FREE: FREE T4: 0.87 ng/dL (ref 0.61–1.12)

## 2014-07-13 MED ORDER — GADOBENATE DIMEGLUMINE 529 MG/ML IV SOLN
8.0000 mL | Freq: Once | INTRAVENOUS | Status: AC | PRN
  Administered 2014-07-13: 8 mL via INTRAVENOUS

## 2014-07-13 MED ORDER — FLUDROCORTISONE ACETATE 0.1 MG PO TABS
0.1000 mg | ORAL_TABLET | Freq: Every day | ORAL | Status: DC
  Administered 2014-07-13 – 2014-07-15 (×3): 0.1 mg via ORAL
  Filled 2014-07-13 (×3): qty 1

## 2014-07-13 MED ORDER — ONDANSETRON HCL 4 MG PO TABS: 4.0000 mg | ORAL_TABLET | Freq: Four times a day (QID) | ORAL | Status: DC | PRN

## 2014-07-13 MED ORDER — ONDANSETRON HCL 4 MG/2ML IJ SOLN
4.0000 mg | Freq: Four times a day (QID) | INTRAMUSCULAR | Status: DC | PRN
  Administered 2014-07-13 – 2014-07-15 (×3): 4 mg via INTRAVENOUS
  Filled 2014-07-13 (×3): qty 2

## 2014-07-13 NOTE — Progress Notes (Addendum)
[facility] Progress Note   Maria Fox QVZ:[id number] DOB: 1945/11/05 DOA: 07/07/2014 PCP: [name], MD  Brief narrative: Maria Fox is a 69 y.o. female with PMH of hypertension, diabetes mellitus, migraines who recently underwent CABG 3 after an NSTEMI on 4/7 in [place] where she was visiting at the time.she was discharged to a rehabilitation facility and then discharged to home this past Tuesday. She claims to be nauseated and lightheaded ever since her CABG and was admitted for complaints of nausea and dizziness on 4/28. She continues to have severe nausea to the point where she is barely eating. She was noted to be orthostatic and despite receiving fluids in the hospital she continues to have positive orthostatic vitals. Per H&P, the patient was previously on amiodarone, Coreg and lisinopril which were discontinued due to her complaints of dizziness.  Subjective: The patient's nausea is improving and she is eating more now. She continues to feel lightheaded when standing. She tells me that she has a headache which she had not mentioned before but has been ongoing for a few days now.   Assessment/Plan: Principal Problem:   Nausea and vomiting - symptoms occurring after CABG per patient -I have reviewed the new medications that she was discharged home with after her CABG and I'm doubtful that any of these will predispose her to nausea.-Many medications are on hold. -She states that symptoms have been improving and she is now able to eat and drink -Continue Zofran every 6 hours PRN  & continue Pepcid   Active Problems:   Orthostatic hypotension -I have noted that she was sent home after her CABG with midodrine and I suspect that the diagnosis of orthostatic hypotension was made while she was admitted for the procedure -As she was initially hypovolemic due to poor PO intake in relation to above mentioned nausea, I hydrated her aggressively.  - However, despite being  adequately hydrated she continues to be significantly orthostatic by vital signs and by symptoms - Baseline cortisol was normal-cosyntropin stimulation test normal as well - have spoken with Dr Buddy Duty (endocrinology) who recommends obtaining aldosterone- renin level and free T4  - he also recommends a trial of Florinef which I will start today - will also obtain an MRI due to her complaint of headaches to look for a pituitary mass -TSH normal on 4/29- 3.457    Diabetes mellitus type 2, uncontrolled- is not on insulin at home -last A1c on 4/29 was 8.2 but previously her A1c's were uncontrolled at 11 -sugars are currently reasonably controlled on low-dose Levemir and sliding scale insulin -Tradjenta and metformin on hold    UTI (lower urinary tract infection) -culture growing Klebsiella pneumonia which is pansensitive-she initially received ciprofloxacin and then Rocephin-  -  She has had a full 3 days of treatment    S/P CABG x 3, April 16 - continue aspirin Plavix Lipitor  NSVT - had an episode when admitted in 1/16 - currently in sinus rhythm  Vitiligo   Appt with PCP: requested Code Status: Full code Family Communication: discussed lab results, imaging results and plan with patient in detail Disposition Plan: Home with home health PT DVT prophylaxis: heparin Consultants: Procedures:  Antibiotics: Anti-infectives    Start     Dose/Rate Route Frequency Ordered Stop   07/09/14 1430  cefTRIAXone (ROCEPHIN) 1 g in dextrose 5 % 50 mL IVPB - Premix  Status:  Discontinued    Comments:  Aware of PCN allergy   1 g  100 mL/hr over 30 Minutes Intravenous Every 24 hours 07/09/14 1333 07/11/14 1146   07/08/14 0200  ciprofloxacin (CIPRO) IVPB 400 mg  Status:  Discontinued     400 mg 200 mL/hr over 60 Minutes Intravenous Every 12 hours 07/08/14 0158 07/09/14 1333      Objective: Filed Weights   07/11/14 0633 07/12/14 0456 07/13/14 0500  Weight: 62.1 kg (136 lb 14.5 oz) 62.1 kg (136  lb 14.5 oz) 62.6 kg (138 lb 0.1 oz)    Intake/Output Summary (Last 24 hours) at 07/13/14 1252 Last data filed at 07/13/14 0918  Gross per 24 hour  Intake    236 ml  Output    300 ml  Net    -64 ml     Vitals Filed Vitals:   07/12/14 0810 07/12/14 2032 07/13/14 0500 07/13/14 0635  BP:    140/80  Pulse:      Temp:  98.1 F (36.7 C)  97.9 F (36.6 C)  TempSrc:  Oral  Oral  Resp: 21   20  Height:      Weight:   62.6 kg (138 lb 0.1 oz)   SpO2: 98% 97%      Exam:  General:  Pt is alert, not in acute distress  HEENT: No icterus, No thrush  Cardiovascular: regular rate and rhythm, S1/S2 No murmur  Respiratory: clear to auscultation bilaterally   Abdomen: Soft, +Bowel sounds, non tender, non distended, no guarding  MSK: No LE edema, cyanosis or clubbing  Data Reviewed: Basic Metabolic Panel:  Recent Labs Lab 07/07/14 2210 07/08/14 0500 07/09/14 0403 07/11/14 0424 07/13/14 0915  NA 137 135 135 139 140  K 4.2 3.6 3.5 3.5 3.7  CL 103 104 107 109 109  CO2 18* 19 18* 23 22  GLUCOSE 195* 212* 223* 86 166*  BUN 15 14 11  <5* <5*  CREATININE 1.20* 0.95 0.97 1.07* 0.95  CALCIUM 9.8 8.9 8.4 8.4* 8.6*  MG  --  1.6  --   --   --   PHOS  --  4.2  --   --   --    Liver Function Tests:  Recent Labs Lab 07/08/14 0500 07/09/14 0403  AST 20 19  ALT 13 12  ALKPHOS 80 76  BILITOT 0.8 0.5  PROT 6.7 5.9*  ALBUMIN 3.3* 2.8*   No results for input(s): LIPASE, AMYLASE in the last 168 hours. No results for input(s): AMMONIA in the last 168 hours. CBC:  Recent Labs Lab 07/07/14 2210 07/08/14 0500 07/09/14 0403 07/11/14 0424  WBC 12.8* 7.2 6.5 6.9  NEUTROABS  --  4.3  --   --   HGB 13.2 11.7* 10.8* 11.4*  HCT 37.4 35.0* 31.9* 33.0*  MCV 87.8 90.2 89.9 88.9  PLT 458* 354 302 294   Cardiac Enzymes:  Recent Labs Lab 07/08/14 1302 07/08/14 1806  TROPONINI 0.03 0.03   BNP (last 3 results)  Recent Labs  07/07/14 2210  BNP 125.5*    ProBNP (last 3  results) No results for input(s): PROBNP in the last 8760 hours.  CBG:  Recent Labs Lab 07/12/14 1117 07/12/14 1612 07/12/14 2101 07/13/14 0632 07/13/14 1115  GLUCAP 138* 192* 190* 120* 179*    Recent Results (from the past 240 hour(s))  Blood Culture (routine x 2)     Status: None (Preliminary result)   Collection Time: 07/07/14 10:10 PM  Result Value Ref Range Status   Specimen Description BLOOD LEFT ARM  Final   Special Requests  BOTTLES DRAWN AEROBIC AND ANAEROBIC 6CC  Final   Culture   Final           BLOOD CULTURE RECEIVED NO GROWTH TO DATE CULTURE WILL BE HELD FOR 5 DAYS BEFORE ISSUING A FINAL NEGATIVE REPORT Performed at [facility]    Report Status PENDING  Incomplete  Blood Culture (routine x 2)     Status: None (Preliminary result)   Collection Time: 07/07/14 10:10 PM  Result Value Ref Range Status   Specimen Description BLOOD RIGHT ARM  Final   Special Requests BOTTLES DRAWN AEROBIC ONLY 10CC  Final   Culture   Final           BLOOD CULTURE RECEIVED NO GROWTH TO DATE CULTURE WILL BE HELD FOR 5 DAYS BEFORE ISSUING A FINAL NEGATIVE REPORT Performed at [facility]    Report Status PENDING  Incomplete  Urine culture     Status: None   Collection Time: 07/07/14 11:35 PM  Result Value Ref Range Status   Specimen Description URINE, RANDOM  Final   Special Requests NONE  Final   Colony Count   Final    >=100,000 COLONIES/ML Performed at [facility]    Culture   Final    KLEBSIELLA PNEUMONIAE Performed at [facility]    Report Status 07/10/2014 FINAL  Final   Organism ID, Bacteria KLEBSIELLA PNEUMONIAE  Final      Susceptibility   Klebsiella pneumoniae - MIC*    AMPICILLIN RESISTANT      CEFAZOLIN <=4 SENSITIVE Sensitive     CEFTRIAXONE <=1 SENSITIVE Sensitive     CIPROFLOXACIN <=0.25 SENSITIVE Sensitive     GENTAMICIN <=1 SENSITIVE Sensitive     LEVOFLOXACIN <=0.12 SENSITIVE Sensitive     NITROFURANTOIN 32 SENSITIVE  Sensitive     TOBRAMYCIN <=1 SENSITIVE Sensitive     TRIMETH/SULFA <=20 SENSITIVE Sensitive     PIP/TAZO <=4 SENSITIVE Sensitive     * KLEBSIELLA PNEUMONIAE     Studies: No results found.  Scheduled Meds:  Scheduled Meds: . aspirin EC  81 mg Oral Daily  . atorvastatin  20 mg Oral QHS  . clopidogrel  75 mg Oral Daily  . famotidine  20 mg Oral QHS  . feeding supplement (ENSURE ENLIVE)  237 mL Oral BID BM  . folic acid  1 mg Oral Daily  . gabapentin  100 mg Oral TID  . heparin  5,000 Units Subcutaneous 3 times per day  . insulin aspart  0-15 Units Subcutaneous TID WC  . insulin aspart  0-5 Units Subcutaneous QHS  . insulin detemir  5 Units Subcutaneous QHS  . ondansetron (ZOFRAN) IV  4 mg Intravenous 4 times per day   Or  . ondansetron  4 mg Oral 4 times per day  . sertraline  50 mg Oral Daily  . sodium chloride  3 mL Intravenous Q12H   Continuous Infusions: . sodium chloride 100 mL/hr at 07/13/14 0424    Time spent on care of this patient: 87 min   [place], MD 07/13/2014, 12:52 PM  LOS: 5 days   [facility] Office  [phone] Pager - Text Page per [email or web address]  If 7PM-7AM, please contact night-coverage [email or web address]

## 2014-07-13 NOTE — Progress Notes (Signed)
Occupational Therapy Treatment Patient Details Name: Maria Fox MRN: [id number] DOB: 1945/08/26 Today's Date: 07/13/2014    History of present illness Pt is a 69 y/o female admitted with dizziness and nausea. Pt recently underwent a CABG that was complicated by a CVA. Pt d/c'd to SNF, returned home and later in the week returned to the ED with above listed symptoms and hypotension.    OT comments  Pt progressing, but was limited by dizziness. Pt able to ambulate to sink today. See vital section below for BPs.  Follow Up Recommendations  No OT follow up;Supervision/Assistance - 24 hour    Equipment Recommendations  None recommended by OT    Recommendations for Other Services      Precautions / Restrictions Precautions Precautions: Fall;Sternal Restrictions Weight Bearing Restrictions: No       Mobility Bed Mobility Overal bed mobility: Needs Assistance Bed Mobility: Supine to Sit;Sit to Supine     Supine to sit: Supervision;HOB elevated Sit to supine: Supervision;HOB elevated   General bed mobility comments: discussed log roll technique. Pt with HOB elevated in session for bed mobility. Cues for sternal precautions.  Transfers Overall transfer level: Needs assistance   Transfers: Sit to/from Stand Sit to Stand: Min guard         General transfer comment: Min guard for safety.    Balance  Min guard for ambulation.                                 ADL Overall ADL's : Needs assistance/impaired     Grooming: Oral care;Min guard;Standing;Wash/dry face;Sitting   Upper Body Bathing: Set up;Supervision/ safety;Sitting   Lower Body Bathing: Min guard;Sit to/from stand   Upper Body Dressing : Set up;Supervision/safety;Sitting   Lower Body Dressing: Set up;Supervision/safety;Sitting/lateral leans   Toilet Transfer: Min guard (bed)           Functional mobility during ADLs: Min guard (bed; used IV pole) General ADL Comments: cues for sternal  precautions. Pt sat EOB and washed off and changed gown as she did not feel she could walk to sink towards beginning of session due to dizziness/trembling. Pt able to walk to sink later in session.      Vision                     Perception     Praxis      Cognition  Awake/Alert Behavior During Therapy: WFL for tasks assessed/performed Overall Cognitive Status: Within Functional Limits for tasks assessed                       Extremity/Trunk Assessment               Exercises     Shoulder Instructions       General Comments      Pertinent Vitals/ Pain       Pain Assessment: 0-10 Pain Score: 7  Pain Location: head Pain Descriptors / Indicators: Headache Pain Intervention(s): Other (comment);Monitored during session (notified nurse)   BP in supine: 143/63; sitting 133/75; standing 110/60; after standing a few minutes 121/67  Home Living                                          Prior Functioning/Environment  Frequency Min 2X/week     Progress Toward Goals  OT Goals(current goals can now be found in the care plan section)  Progress towards OT goals: Progressing toward goals  Acute Rehab OT Goals Patient Stated Goal: not stated OT Goal Formulation: With patient Time For Goal Achievement: 07/18/14 Potential to Achieve Goals: Good ADL Goals Pt Will Perform Grooming: with set-up;standing Pt Will Perform Lower Body Bathing: with set-up;sit to/from stand Pt Will Perform Lower Body Dressing: with set-up;sit to/from stand Pt Will Transfer to Toilet: with modified independence;ambulating;regular height toilet  Plan Discharge plan remains appropriate    Co-evaluation                 End of Session Equipment Utilized During Treatment: Gait belt   Activity Tolerance Other (comment) (dizziness)   Patient Left in bed;with family/visitor present;with call bell/phone within reach   Nurse Communication  Other (comment) (BP and headache)        Time: 3710-6269 OT Time Calculation (min): 18 min  Charges: OT General Charges $OT Visit: 1 Procedure OT Treatments $Self Care/Home Management : 8-22 mins  Benito Mccreedy OTR/L [phone] 07/13/2014, 11:44 AM

## 2014-07-13 NOTE — Plan of Care (Addendum)
Problem: Phase I Progression Outcomes Goal: Hemodynamically stable Outcome: Not Progressing Positive  orthostatic vital signs
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
## 2014-07-14 ENCOUNTER — Encounter (HOSPITAL_COMMUNITY): Payer: Self-pay | Admitting: Radiology

## 2014-07-14 ENCOUNTER — Inpatient Hospital Stay (HOSPITAL_COMMUNITY): Payer: Medicare Other

## 2014-07-14 DIAGNOSIS — I638 Other cerebral infarction: Secondary | ICD-10-CM

## 2014-07-14 LAB — CULTURE, BLOOD (ROUTINE X 2)
CULTURE: NO GROWTH
Culture: NO GROWTH

## 2014-07-14 LAB — GLUCOSE, CAPILLARY
GLUCOSE-CAPILLARY: 107 mg/dL — AB (ref 70–99)
GLUCOSE-CAPILLARY: 101 mg/dL — AB (ref 70–99)
Glucose-Capillary: 99 mg/dL (ref 70–99)
Glucose-Capillary: 131 mg/dL — ABNORMAL HIGH (ref 70–99)

## 2014-07-14 LAB — BASIC METABOLIC PANEL
ANION GAP: 8 (ref 5–15)
BUN: <5 mg/dL — ABNORMAL LOW (ref 6–20)
CALCIUM: 8.4 mg/dL — AB (ref 8.9–10.3)
CHLORIDE: 110 mmol/L (ref 101–111)
CO2: 22 mmol/L (ref 22–32)
Creatinine, Ser: 0.84 mg/dL (ref 0.44–1.00)
GFR calc Af Amer: >60 mL/min (ref >60)
GFR calc non Af Amer: >60 mL/min (ref >60)
Glucose, Bld: 117 mg/dL — ABNORMAL HIGH (ref 70–99)
Potassium: 2.9 mmol/L — ABNORMAL LOW (ref 3.5–5.1)
SODIUM: 140 mmol/L (ref 135–145)

## 2014-07-14 MED ORDER — IOHEXOL 350 MG/ML SOLN
100.0000 mL | Freq: Once | INTRAVENOUS | Status: AC | PRN
  Administered 2014-07-14: 100 mL via INTRAVENOUS
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
## 2014-07-14 NOTE — Progress Notes (Signed)
Medicare Important Message given? YES  (If response is "NO", the following Medicare IM given date fields will be blank)  Date Medicare IM given: 07/14/14 Medicare IM given by:  Dahlia Client [employer]

## 2014-07-14 NOTE — Progress Notes (Signed)
PT Cancellation Note  Patient Details Name: JULISA FLIPPO MRN: [id number] DOB: [birth year]   Cancelled Treatment:    Reason Eval/Treat Not Completed: Patient at procedure or test/unavailable;Medical issues which prohibited therapy.  Attempted to see patient x2.  Patient out of room in testing.  On second attempt, patient nauseated and declined PT this afternoon.  Will return at a later time for PT session.   Despina Pole 07/14/2014, 7:02 PM Carita Pian. Sanjuana Kava, [place] Pager [phone]

## 2014-07-14 NOTE — Progress Notes (Signed)
[facility] Progress Note   Maria Fox TIW:[id number] DOB: [birth year] DOA: 07/07/2014 PCP: [name], MD  Brief narrative: Maria Fox is a 69 y.o. female with PMH of hypertension, diabetes mellitus, migraines who recently underwent CABG 3 after an NSTEMI on 4/7 in [place] where she was visiting at the time.she was discharged to a rehabilitation facility and then discharged to home this past Tuesday. She claims to be nauseated and lightheaded ever since her CABG and was admitted for complaints of nausea and dizziness on 4/28. She continues to have severe nausea to the point where she is barely eating. She was noted to be orthostatic and despite receiving fluids in the hospital she continues to have positive orthostatic vitals. Per H&P, the patient was previously on amiodarone, Coreg and lisinopril which were discontinued due to her complaints of dizziness.  Subjective: The patient has a headache this AM but otherwise is doing well. No nausea or vomiting. No abdominal pain. Eating much better now.   Assessment/Plan: Principal Problem:   Nausea and vomiting - symptoms occurring after CABG per patient -I have reviewed the new medications that she was discharged home with after her CABG and I'm doubtful that any of these will predispose her to nausea.-Many medications are on hold. -She states that symptoms have been improving and she is now able to eat and drink -Continue Zofran every 6 hours PRN  & continue Pepcid   Active Problems:   Orthostatic hypotension -I have noted that she was sent home after her CABG with midodrine and I suspect that the diagnosis of orthostatic hypotension was made while she was admitted for the procedure -As she was initially hypovolemic due to poor PO intake in relation to above mentioned nausea, I hydrated her aggressively.  - However, despite being adequately hydrated she continued to be significantly orthostatic by vital signs and by  symptoms -TSH normal on 4/29- 3.457 - Baseline cortisol was normal-cosyntropin stimulation test normal as well - have spoken with Dr Buddy Duty (endocrinology) who recommends obtaining aldosterone- renin level and free T4  - Free T4 normal - he also recommended a trial of Florinef which I started on 5/4 - orthostatic vitals noted to be improved today with only a 10 mm mercury drop in BP and 10 bpm rise in HR - will continue current dose of Florinef, have her increase her ambulation today and will monitor for improvement in symptoms - if aldosterone/renin ratio normal, orthostatic hypotension may be related to autonomic dysfunction in relation to diabetes  Acute/ subacute multifocal CVA - obtained MRI on 5/4 to further evaluate symptoms of headache in relation to orthostasis/ possible adrenal/ pituitary insufficiency  - this revealed multifocal infarcts in corona radiata bilaterally in the watershed area likely secondary to hypotension in relation to orthostasis earlier on in the patient's hospital stay- BP on 5/2 was noted to be in systolic 25K dropping to 53Z during standing- - BP now significantly improved- Patient is asymptomatic from above infarcts - ECHO already performed and found to be normal- see report below - carotid duplex ordered - already on Plavix and Lipitor- aspirin 81 mg added by Neurology    Diabetes mellitus type 2, uncontrolled- is not on insulin at home -last A1c on 4/29 was 8.2 but previously her A1c's were uncontrolled at 11 -sugars are currently reasonably controlled in the hospital on low-dose Levemir and sliding scale insulin -Tradjenta and metformin on hold    UTI (lower urinary tract infection) -culture growing Klebsiella pneumonia  which is pansensitive-she initially received ciprofloxacin and then Rocephin-  -  She has had a full 3 days of treatment    S/P CABG x 3, April 16 - continue aspirin Plavix Lipitor  NSVT - had an episode when admitted in 1/16 -  currently in sinus rhythm  Vitiligo   Appt with PCP: requested Code Status: Full code Family Communication: discussed lab results, imaging results and plan with patient in detail Disposition Plan: Home with home health PT DVT prophylaxis: heparin Consultants: Neuro Procedures: 2D ECHO Left ventricle: The cavity size was normal. There was mild concentric hypertrophy. Systolic function was vigorous. The estimated ejection fraction was in the range of 65% to 70%. Doppler parameters are consistent with abnormal left ventricular relaxation (grade 1 diastolic dysfunction).  Antibiotics: Anti-infectives    Start     Dose/Rate Route Frequency Ordered Stop   07/09/14 1430  cefTRIAXone (ROCEPHIN) 1 g in dextrose 5 % 50 mL IVPB - Premix  Status:  Discontinued    Comments:  Aware of PCN allergy   1 g 100 mL/hr over 30 Minutes Intravenous Every 24 hours 07/09/14 1333 07/11/14 1146   07/08/14 0200  ciprofloxacin (CIPRO) IVPB 400 mg  Status:  Discontinued     400 mg 200 mL/hr over 60 Minutes Intravenous Every 12 hours 07/08/14 0158 07/09/14 1333      Objective: Filed Weights   07/12/14 0456 07/13/14 0500 07/14/14 0507  Weight: 62.1 kg (136 lb 14.5 oz) 62.6 kg (138 lb 0.1 oz) 63.6 kg (140 lb 3.4 oz)    Intake/Output Summary (Last 24 hours) at 07/14/14 0950 Last data filed at 07/14/14 0839  Gross per 24 hour  Intake    590 ml  Output   2700 ml  Net  -2110 ml     Vitals Filed Vitals:   07/13/14 0635 07/13/14 1411 07/13/14 2144 07/14/14 0507  BP: 140/80 163/75 166/73 133/64  Pulse:  95 88 99  Temp: 97.9 F (36.6 C) 98 F (36.7 C) 98.1 F (36.7 C) 97.9 F (36.6 C)  TempSrc: Oral Oral Oral Oral  Resp: 20 19 18 18   Height:      Weight:    63.6 kg (140 lb 3.4 oz)  SpO2:  97% 96% 94%    Exam:  General:  Pt is alert, not in acute distress  HEENT: No icterus, No thrush  Cardiovascular: regular rate and rhythm, S1/S2 No murmur  Respiratory: clear to auscultation  bilaterally   Abdomen: Soft, +Bowel sounds, non tender, non distended, no guarding  MSK: No LE edema, cyanosis or clubbing  Data Reviewed: Basic Metabolic Panel:  Recent Labs Lab 07/08/14 0500 07/09/14 0403 07/11/14 0424 07/13/14 0915 07/14/14 0500  NA 135 135 139 140 140  K 3.6 3.5 3.5 3.7 2.9*  CL 104 107 109 109 110  CO2 19 18* 23 22 22   GLUCOSE 212* 223* 86 166* 117*  BUN 14 11 <5* <5* <5*  CREATININE 0.95 0.97 1.07* 0.95 0.84  CALCIUM 8.9 8.4 8.4* 8.6* 8.4*  MG 1.6  --   --   --   --   PHOS 4.2  --   --   --   --    Liver Function Tests:  Recent Labs Lab 07/08/14 0500 07/09/14 0403  AST 20 19  ALT 13 12  ALKPHOS 80 76  BILITOT 0.8 0.5  PROT 6.7 5.9*  ALBUMIN 3.3* 2.8*   No results for input(s): LIPASE, AMYLASE in the last 168 hours. No  results for input(s): AMMONIA in the last 168 hours. CBC:  Recent Labs Lab 07/07/14 2210 07/08/14 0500 07/09/14 0403 07/11/14 0424  WBC 12.8* 7.2 6.5 6.9  NEUTROABS  --  4.3  --   --   HGB 13.2 11.7* 10.8* 11.4*  HCT 37.4 35.0* 31.9* 33.0*  MCV 87.8 90.2 89.9 88.9  PLT 458* 354 302 294   Cardiac Enzymes:  Recent Labs Lab 07/08/14 1302 07/08/14 1806  TROPONINI 0.03 0.03   BNP (last 3 results)  Recent Labs  07/07/14 2210  BNP 125.5*    ProBNP (last 3 results) No results for input(s): PROBNP in the last 8760 hours.  CBG:  Recent Labs Lab 08/12/2014 0632 08-12-2014 1115 12-Aug-2014 1650 08-12-2014 2141 07/14/14 0638  GLUCAP 120* 179* 124* 97 107*    Recent Results (from the past 240 hour(s))  Blood Culture (routine x 2)     Status: None   Collection Time: 07/07/14 10:10 PM  Result Value Ref Range Status   Specimen Description BLOOD LEFT ARM  Final   Special Requests BOTTLES DRAWN AEROBIC AND ANAEROBIC 6CC  Final   Culture   Final    NO GROWTH 5 DAYS Performed at [facility]    Report Status 07/14/2014 FINAL  Final  Blood Culture (routine x 2)     Status: None   Collection Time:  07/07/14 10:10 PM  Result Value Ref Range Status   Specimen Description BLOOD RIGHT ARM  Final   Special Requests BOTTLES DRAWN AEROBIC ONLY 10CC  Final   Culture   Final    NO GROWTH 5 DAYS Performed at [facility]    Report Status 07/14/2014 FINAL  Final  Urine culture     Status: None   Collection Time: 07/07/14 11:35 PM  Result Value Ref Range Status   Specimen Description URINE, RANDOM  Final   Special Requests NONE  Final   Colony Count   Final    >=100,000 COLONIES/ML Performed at [facility]    Culture   Final    KLEBSIELLA PNEUMONIAE Performed at [facility]    Report Status 07/10/2014 FINAL  Final   Organism ID, Bacteria KLEBSIELLA PNEUMONIAE  Final      Susceptibility   Klebsiella pneumoniae - MIC*    AMPICILLIN RESISTANT      CEFAZOLIN <=4 SENSITIVE Sensitive     CEFTRIAXONE <=1 SENSITIVE Sensitive     CIPROFLOXACIN <=0.25 SENSITIVE Sensitive     GENTAMICIN <=1 SENSITIVE Sensitive     LEVOFLOXACIN <=0.12 SENSITIVE Sensitive     NITROFURANTOIN 32 SENSITIVE Sensitive     TOBRAMYCIN <=1 SENSITIVE Sensitive     TRIMETH/SULFA <=20 SENSITIVE Sensitive     PIP/TAZO <=4 SENSITIVE Sensitive     * KLEBSIELLA PNEUMONIAE     Studies: Mr Kizzie Fantasia Contrast  12-Aug-2014   CLINICAL DATA:  Dizziness. Evaluate for pituitary mass. Recent presentation to emergency department with hypotension.  EXAM: MRI HEAD WITHOUT AND WITH CONTRAST  TECHNIQUE: Multiplanar, multiecho pulse sequences of the brain and surrounding structures were obtained without and with intravenous contrast.  CONTRAST:  30mL MULTIHANCE GADOBENATE DIMEGLUMINE 529 MG/ML IV SOLN  COMPARISON:  Head CT 07/08/2014  FINDINGS: Calvarium and upper cervical spine: No marrow signal abnormality.  Orbits: Bilateral cataract resection.  Sinuses: Clear. Probable mild mucosal thickening in the bilateral mastoid tips.  Brain: Small (1cm or smaller) acute infarcts in the bilateral corona radiata, symmetric in  pattern although more extensive on the  left. The left corona radiata signal abnormality is enhancing, suggesting subacute timing. No masslike enhancement is suspected.  The pituitary gland has normal size and enhancement for age. Normal chiasm and cavernous sinuses.  When accounting for FLAIR hyperintensity at the sites of acute infarction, there is no white matter disease for age. Mild generalized cerebral volume loss, mildly advanced for age.  No hydrocephalus.  No evidence of major vessel occlusion.  IMPRESSION: 1. Small acute to subacute infarcts in the bilateral corona radiata. Distribution and history of recurrent hypotension suggests deep watershed injury. 2. Normal pituitary gland.   Electronically Signed   By: Monte Fantasia M.D.   On: 07/13/2014 16:36    Scheduled Meds:  Scheduled Meds: . aspirin EC  81 mg Oral Daily  . atorvastatin  20 mg Oral QHS  . clopidogrel  75 mg Oral Daily  . famotidine  20 mg Oral QHS  . feeding supplement (ENSURE ENLIVE)  237 mL Oral BID BM  . fludrocortisone  0.1 mg Oral Daily  . folic acid  1 mg Oral Daily  . gabapentin  100 mg Oral TID  . heparin  5,000 Units Subcutaneous 3 times per day  . insulin aspart  0-15 Units Subcutaneous TID WC  . insulin aspart  0-5 Units Subcutaneous QHS  . insulin detemir  5 Units Subcutaneous QHS  . sertraline  50 mg Oral Daily  . sodium chloride  3 mL Intravenous Q12H   Continuous Infusions: . sodium chloride 50 mL/hr at 07/14/14 0816    Time spent on care of this patient: 35 min   [place], MD 07/14/2014, 9:50 AM  LOS: 6 days   [facility] Office  [phone] Pager - Text Page per [email or web address]  If 7PM-7AM, please contact night-coverage [email or web address]

## 2014-07-14 NOTE — Progress Notes (Signed)
07/14/2014 1130 Pt ambulated 550 ft with stand by assist on RA.  Stated slight dizziness at times, otherwise tolerated well. [place]

## 2014-07-14 NOTE — Progress Notes (Signed)
STROKE TEAM PROGRESS NOTE   HISTORY Maria Fox is an 69 y.o. female with a past medical history significant for HTN, DM, hyperlipidemia, diabetic neuropathy, vitiligo,  CAD w/ NSTEMI s/p CABG 4/16 , discharged to rehab then to home. She was admitted 6 days ago to [facility] for continued dizziness, nausea, and generalized weakness. Neuro was consulted for further evaluation of HA, nausea, vomiting, orthostatic hypotension and positive stroke on MRI. Maria Fox indicated that " I am not a HA person but since this past Sunday I have been having a daily, dull, constant HA". There is not associated focal weakness, slurred speech, double vision, difficulty swallowing, or visual disturbances. On the other hand, she reports daily nausea, occasional vomiting, and lightheadedness since heart surgery on 4/16. Of note, orthostatic hypotension have been noted since surgery and at some point she was on midodrine.  She states that symptoms have been improving and she is now able to eat and drink.  Her symptoms prompted MRI brain that was reviewed and demonstrated findings suggestive of small acute to subacute infarcts in the bilateral corona radiata. Pituitary gland is normal. TSH normal on 4/29- 3.457. Baseline cortisol was normal-cosyntropin stimulation test normal as well. Aldosterone- renin level and free T4 are pending. She is on aspirin and plavix. Date last known well unclear. tPA was not considered for tpa d/t recent surgery.  Patient was not administered TPA secondary to delay in arrival, recent surgery. She was admitted for further evaluation and treatment.   SUBJECTIVE (INTERVAL HISTORY) Her family is at the bedside.  Overall she feels her condition is unchanged. She still has nausea, light-headedness and feels weak.   OBJECTIVE Temp:  [97.9 F (36.6 C)-98.1 F (36.7 C)] 97.9 F (36.6 C) (05/05 0507) Pulse Rate:  [88-99] 99 (05/05 0507) Cardiac Rhythm:  [-] Normal sinus rhythm;Sinus tachycardia (05/04  2107) Resp:  [18-19] 18 (05/05 0507) BP: (133-166)/(64-75) 133/64 mmHg (05/05 0507) SpO2:  [94 %-97 %] 94 % (05/05 0507) Weight:  [63.6 kg (140 lb 3.4 oz)] 63.6 kg (140 lb 3.4 oz) (05/05 0507)   Recent Labs Lab 07/13/14 0632 07/13/14 1115 07/13/14 1650 07/13/14 2141 07/14/14 0638  GLUCAP 120* 179* 124* 97 107*    Recent Labs Lab 07/08/14 0500 07/09/14 0403 07/11/14 0424 07/13/14 0915 07/14/14 0500  NA 135 135 139 140 140  K 3.6 3.5 3.5 3.7 2.9*  CL 104 107 109 109 110  CO2 19 18* 23 22 22   GLUCOSE 212* 223* 86 166* 117*  BUN 14 11 <5* <5* <5*  CREATININE 0.95 0.97 1.07* 0.95 0.84  CALCIUM 8.9 8.4 8.4* 8.6* 8.4*  MG 1.6  --   --   --   --   PHOS 4.2  --   --   --   --     Recent Labs Lab 07/08/14 0500 07/09/14 0403  AST 20 19  ALT 13 12  ALKPHOS 80 76  BILITOT 0.8 0.5  PROT 6.7 5.9*  ALBUMIN 3.3* 2.8*    Recent Labs Lab 07/07/14 2210 07/08/14 0500 07/09/14 0403 07/11/14 0424  WBC 12.8* 7.2 6.5 6.9  NEUTROABS  --  4.3  --   --   HGB 13.2 11.7* 10.8* 11.4*  HCT 37.4 35.0* 31.9* 33.0*  MCV 87.8 90.2 89.9 88.9  PLT 458* 354 302 294    Recent Labs Lab 07/08/14 1302 07/08/14 1806  TROPONINI 0.03 0.03   No results for input(s): LABPROT, INR in the last 72 hours. No results for  input(s): COLORURINE, LABSPEC, PHURINE, GLUCOSEU, HGBUR, BILIRUBINUR, KETONESUR, PROTEINUR, UROBILINOGEN, NITRITE, LEUKOCYTESUR in the last 72 hours.  Invalid input(s): APPERANCEUR     Component Value Date/Time   CHOL 181 06/03/2012 0425   TRIG 79 06/03/2012 0425   HDL 52 06/03/2012 0425   CHOLHDL 3.5 06/03/2012 0425   VLDL 16 06/03/2012 0425   LDLCALC 113* 06/03/2012 0425   Lab Results  Component Value Date   HGBA1C 8.2* 07/08/2014   No results found for: LABOPIA, COCAINSCRNUR, LABBENZ, AMPHETMU, THCU, LABBARB  No results for input(s): ETH in the last 168 hours.  Mr [name] Contrast  07/13/2014   CLINICAL DATA:  Dizziness. Evaluate for pituitary mass. Recent  presentation to emergency department with hypotension.  EXAM: MRI HEAD WITHOUT AND WITH CONTRAST  TECHNIQUE: Multiplanar, multiecho pulse sequences of the brain and surrounding structures were obtained without and with intravenous contrast.  CONTRAST:  66mL MULTIHANCE GADOBENATE DIMEGLUMINE 529 MG/ML IV SOLN  COMPARISON:  Head CT 07/08/2014  FINDINGS: Calvarium and upper cervical spine: No marrow signal abnormality.  Orbits: Bilateral cataract resection.  Sinuses: Clear. Probable mild mucosal thickening in the bilateral mastoid tips.  Brain: Small (1cm or smaller) acute infarcts in the bilateral corona radiata, symmetric in pattern although more extensive on the left. The left corona radiata signal abnormality is enhancing, suggesting subacute timing. No masslike enhancement is suspected.  The pituitary gland has normal size and enhancement for age. Normal chiasm and cavernous sinuses.  When accounting for FLAIR hyperintensity at the sites of acute infarction, there is no white matter disease for age. Mild generalized cerebral volume loss, mildly advanced for age.  No hydrocephalus.  No evidence of major vessel occlusion.  IMPRESSION: 1. Small acute to subacute infarcts in the bilateral corona radiata. Distribution and history of recurrent hypotension suggests deep watershed injury. 2. Normal pituitary gland.   Electronically Signed   By: Monte Fantasia M.D.   On: 07/13/2014 16:36   2D Echocardiogram   - Left ventricle: The cavity size was normal. Wall thickness wasincreased in a pattern of mild LVH. Systolic function was normal.The estimated ejection fraction was in the range of 55% to 60%.Wall motion was normal; there were no regional wall motionabnormalities. Doppler parameters are consistent with abnormalleft ventricular relaxation (grade 1 diastolic dysfunction).   PHYSICAL EXAM Pleasant elderly lady not in distress. . Afebrile. Head is nontraumatic. Neck is supple without bruit.    Cardiac exam no  murmur or gallop. Lungs are clear to auscultation. Distal pulses are well felt. Neurological Exam ;  Awake  Alert oriented x 3. Normal speech and language.eye movements full without nystagmus.fundi were not visualized. Vision acuity and fields appear normal. Hearing is normal. Palatal movements are normal. Face symmetric. Tongue midline. Normal strength, tone, reflexes and coordination. Normal sensation. Gait deferred. ASSESSMENT/PLAN Maria Fox is a 69 y.o. female with history of hypertension, diabetes mellitus, migraines who recently underwent CABG 3 after an NSTEMI on 4/7 in [place] where she was visiting at the time, discharged to a rehabilitation facility and then discharged. She  presenting to [facility] with ongoing nausea, lightheadedness and vomiting. She is hypotensive. She did not receive IV t-PA due to being out of the time window as well as recent surgery.   Nausea/vomiting/dizziness, differential includes Incidental stroke from recent surgery vs symptomatic hypotension/hypovolemia  Resultant  Nausea, lightheadedness  MRI  Bilateral corona radiata infarcts  CT angio head ordered  Carotid Doppler  canceled  2D Echo  No source  of embolus   Heparin 5000 units sq tid for VTE prophylaxis  Diet regular Room service appropriate?: Yes; Fluid consistency:: Thin  clopidogrel 75 mg orally every day prior to admission, now on aspirin 81 mg orally every day and clopidogrel 75 mg orally every day (new aspirin added this admission). Discontinue additional aspirin if no signs of large vessel disease found. Otherwise, continue both x 3 months then 1 alone  Ongoing aggressive stroke risk factor management  Therapy recommendations:  HH PT  Disposition:  Home with HH therapies   Hyperlipidemia  Home meds:  lipitor 20, resumed in hospital  LDL ordered, goal < 70  Continue statin at discharge  Diabetes, type 2, uncontrolled  HgbA1c 8.2, goal < 7.0  Uncontrolled  Other  Stroke Risk Factors  Advanced age  No Family hx stroke Coronary artery disease - CABGx3 06/2014  Hx remote Migraines  Other Active Problems  Orthostatic Hypotension  UTI on rocephin  NSVT on admission, now in [facility] day # [place] [facility] for Pager information 07/14/2014 3:02 PM  I have personally examined this patient, reviewed notes, independently viewed imaging studies, participated in medical decision making and plan of care. I have made any additions or clarifications directly to the above note. Agree with note above. She presented with nausea, vomiting, dizziness and generalized weakness without focal findings but MRI scan shows bilateral acute to subacute watershed looking infarcts. It is unclear whether these represent incidental strokes related to her recent cardiac surgery or not and presenting symptoms are unrelated. She however remains at risk for recurrent strokes, TIAs and neurological worsening and needs ongoing stroke evaluation and aggressive risk factor modification. Antony Contras, MD Medical Director [facility] Pager: [phone] 07/14/2014 3:56 PM    To contact Stroke Continuity provider, please refer to [email or web address] After hours, contact General Neurology

## 2014-07-15 ENCOUNTER — Inpatient Hospital Stay (HOSPITAL_COMMUNITY): Payer: Medicare Other

## 2014-07-15 DIAGNOSIS — R110 Nausea: Secondary | ICD-10-CM

## 2014-07-15 DIAGNOSIS — J189 Pneumonia, unspecified organism: Secondary | ICD-10-CM

## 2014-07-15 DIAGNOSIS — I951 Orthostatic hypotension: Secondary | ICD-10-CM

## 2014-07-15 LAB — BASIC METABOLIC PANEL
Anion gap: 10 (ref 5–15)
Anion gap: 10 (ref 5–15)
BUN: <5 mg/dL — ABNORMAL LOW (ref 6–20)
CHLORIDE: 106 mmol/L (ref 101–111)
CO2: 23 mmol/L (ref 22–32)
CO2: 23 mmol/L (ref 22–32)
CREATININE: 0.74 mg/dL (ref 0.44–1.00)
Calcium: 8.6 mg/dL — ABNORMAL LOW (ref 8.9–10.3)
Calcium: 9.1 mg/dL (ref 8.9–10.3)
Chloride: 106 mmol/L (ref 101–111)
Creatinine, Ser: 0.94 mg/dL (ref 0.44–1.00)
GFR calc Af Amer: >60 mL/min (ref >60)
GFR calc Af Amer: >60 mL/min (ref >60)
GFR calc non Af Amer: >60 mL/min (ref >60)
GFR calc non Af Amer: 60 mL/min — ABNORMAL LOW (ref >60)
Glucose, Bld: 95 mg/dL (ref 70–99)
Glucose, Bld: 112 mg/dL — ABNORMAL HIGH (ref 70–99)
POTASSIUM: 4.3 mmol/L (ref 3.5–5.1)
Potassium: 2.7 mmol/L — CL (ref 3.5–5.1)
Sodium: 139 mmol/L (ref 135–145)
Sodium: 139 mmol/L (ref 135–145)

## 2014-07-15 LAB — CBC
HEMATOCRIT: 31.8 % — AB (ref 36.0–46.0)
HEMOGLOBIN: 10.8 g/dL — AB (ref 12.0–15.0)
MCH: 30.3 pg (ref 26.0–34.0)
MCHC: 34 g/dL (ref 30.0–36.0)
MCV: 89.3 fL (ref 78.0–100.0)
Platelets: 254 10*3/uL (ref 150–400)
RBC: 3.56 MIL/uL — ABNORMAL LOW (ref 3.87–5.11)
RDW: 14.4 % (ref 11.5–15.5)
WBC: 6.4 10*3/uL (ref 4.0–10.5)

## 2014-07-15 LAB — GLUCOSE, CAPILLARY
GLUCOSE-CAPILLARY: 106 mg/dL — AB (ref 70–99)
Glucose-Capillary: 109 mg/dL — ABNORMAL HIGH (ref 70–99)
Glucose-Capillary: 109 mg/dL — ABNORMAL HIGH (ref 70–99)
Glucose-Capillary: 106 mg/dL — ABNORMAL HIGH (ref 70–99)

## 2014-07-15 LAB — URINALYSIS, ROUTINE W REFLEX MICROSCOPIC
Bilirubin Urine: NEGATIVE
GLUCOSE, UA: NEGATIVE mg/dL
HGB URINE DIPSTICK: NEGATIVE
KETONES UR: NEGATIVE mg/dL
Nitrite: NEGATIVE
PROTEIN: NEGATIVE mg/dL
Specific Gravity, Urine: 1.008 (ref 1.005–1.030)
UROBILINOGEN UA: 0.2 mg/dL (ref 0.0–1.0)
pH: 7 (ref 5.0–8.0)

## 2014-07-15 LAB — URINE MICROSCOPIC-ADD ON

## 2014-07-15 LAB — ALDOSTERONE + RENIN ACTIVITY W/ RATIO: ALDO / PRA Ratio: UNDETERMINED

## 2014-07-15 LAB — LIPID PANEL
CHOL/HDL RATIO: 3.6 ratio
CHOLESTEROL: 107 mg/dL (ref 0–200)
HDL: 30 mg/dL — ABNORMAL LOW (ref >40)
LDL Cholesterol: 48 mg/dL (ref 0–99)
TRIGLYCERIDES: 144 mg/dL (ref <150)
VLDL: 29 mg/dL (ref 0–40)

## 2014-07-15 MED ORDER — POTASSIUM CHLORIDE 20 MEQ/15ML (10%) PO SOLN
40.0000 meq | Freq: Four times a day (QID) | ORAL | Status: AC
  Administered 2014-07-15 (×2): 40 meq via ORAL
  Filled 2014-07-15 (×2): qty 30

## 2014-07-15 MED ORDER — PYRIDOSTIGMINE BROMIDE 60 MG PO TABS: 30.0000 mg | ORAL_TABLET | Freq: Three times a day (TID) | ORAL | Status: DC

## 2014-07-15 MED ORDER — LEVOFLOXACIN 750 MG PO TABS
750.0000 mg | ORAL_TABLET | ORAL | Status: DC
  Administered 2014-07-15: 750 mg via ORAL
  Filled 2014-07-15 (×2): qty 1

## 2014-07-15 MED ORDER — GLUCERNA SHAKE PO LIQD
237.0000 mL | Freq: Three times a day (TID) | ORAL | Status: DC
  Administered 2014-07-15 – 2014-07-17 (×4): 237 mL via ORAL

## 2014-07-15 MED ORDER — ONDANSETRON HCL 4 MG/2ML IJ SOLN
4.0000 mg | Freq: Four times a day (QID) | INTRAMUSCULAR | Status: DC
  Administered 2014-07-15 – 2014-07-17 (×6): 4 mg via INTRAVENOUS
  Filled 2014-07-15 (×6): qty 2

## 2014-07-15 MED ORDER — POTASSIUM CHLORIDE 10 MEQ/100ML IV SOLN
10.0000 meq | INTRAVENOUS | Status: AC
  Administered 2014-07-15 (×4): 10 meq via INTRAVENOUS
  Filled 2014-07-15 (×4): qty 100

## 2014-07-15 MED ORDER — PYRIDOSTIGMINE BROMIDE 60 MG PO TABS
30.0000 mg | ORAL_TABLET | Freq: Three times a day (TID) | ORAL | Status: DC
  Administered 2014-07-15 – 2014-07-17 (×6): 30 mg via ORAL
  Filled 2014-07-15 (×10): qty 0.5

## 2014-07-15 MED ORDER — GLUCERNA PO LIQD: 237.0000 mL | Freq: Three times a day (TID) | ORAL | Status: DC

## 2014-07-15 MED ORDER — DIPHENOXYLATE-ATROPINE 2.5-0.025 MG PO TABS
2.0000 | ORAL_TABLET | ORAL | Status: AC
  Administered 2014-07-15: 2 via ORAL
  Filled 2014-07-15: qty 2

## 2014-07-15 MED ORDER — ONDANSETRON HCL 4 MG PO TABS
4.0000 mg | ORAL_TABLET | Freq: Four times a day (QID) | ORAL | Status: DC
  Administered 2014-07-15 – 2014-07-18 (×7): 4 mg via ORAL
  Filled 2014-07-15 (×18): qty 1

## 2014-07-15 NOTE — Progress Notes (Signed)
Occupational Therapy Treatment Patient Details Name: Maria Fox MRN: [id number] DOB: [birth year] Today's Date: 07/15/2014    History of present illness Pt is a 69 y/o female admitted with dizziness and nausea. Pt recently underwent a CABG that was complicated by a CVA. Pt d/c'd to SNF, returned home and later in the week returned to the ED with above listed symptoms and hypotension.    OT comments  Pt progressing slowly and continues to have mild orthostasis. Pt c/o mild lightheadedness and encouraged to sit upright with HOB elevated. Pt will continue to benefit from acute OT to progress to Supervision/setup level to return home with family assist.    Follow Up Recommendations  No OT follow up    Equipment Recommendations  None recommended by OT    Recommendations for Other Services      Precautions / Restrictions Precautions Precautions: Fall;Sternal Restrictions Weight Bearing Restrictions: No       Mobility Bed Mobility Overal bed mobility: Modified Independent             General bed mobility comments: increased time. Good technique with log roll.   Transfers Overall transfer level: Needs assistance Equipment used: None Transfers: Sit to/from Stand Sit to Stand: Min guard         General transfer comment: Min guard for safety. No sway or LOB. Increased time required.         ADL Overall ADL's : Needs assistance/impaired     Grooming: Min guard;Standing       Lower Body Bathing: Min guard;Sit to/from stand       Lower Body Dressing: Min guard;Sit to/from stand   Toilet Transfer: Min guard;Ambulation           Functional mobility during ADLs: Min guard General ADL Comments: Pt limited by diarrhea today and c/o stomach not feeling well, however agreeable to get OOB. Pt mildly orthostatic upon standing and c/o mild lightheadedness but no dizziness.                 Cognition  Arousal/Alertness: Awake/alert Behavior During Therapy: WFL  for tasks assessed/performed Overall Cognitive Status: Within Functional Limits for tasks assessed                                    Pertinent Vitals/ Pain       Pain Assessment: No/denies pain         Frequency Min 2X/week     Progress Toward Goals  OT Goals(current goals can now be found in the care plan section)  Progress towards OT goals: Progressing toward goals     Plan Discharge plan remains appropriate       End of Session Equipment Utilized During Treatment: Gait belt   Activity Tolerance Patient tolerated treatment well   Patient Left in bed;with call bell/phone within reach;with family/visitor present   Nurse Communication Other (comment) (BP)        Time: 9532-0233 OT Time Calculation (min): 25 min  Charges: OT General Charges $OT Visit: 1 Procedure OT Treatments $Self Care/Home Management : 8-22 mins $Therapeutic Activity: 8-22 mins  Villa Herb M 07/15/2014, 2:11 PM  Cyndie Chime, OTR/L Occupational Therapist [phone] (pager)

## 2014-07-15 NOTE — Progress Notes (Signed)
07/15/2014 1445 O2 saturation remained in the high 90's on RA while ambulating. [place]

## 2014-07-15 NOTE — Progress Notes (Addendum)
[facility] Progress Note   Maria Fox ZCH:[id number] DOB: 12/03/1945 DOA: 07/07/2014 PCP: [name], MD  Brief narrative: Maria Fox is a 69 y.o. female with PMH of hypertension, diabetes mellitus, migraines who recently underwent CABG 3 after an NSTEMI on 4/7 in [place] where she was visiting at the time.she was discharged to a rehabilitation facility and then discharged to home this past Tuesday. She claims to be nauseated and lightheaded ever since her CABG and was admitted for complaints of nausea and dizziness on 4/28. She continues to have severe nausea to the point where she is barely eating. She was noted to be orthostatic and despite receiving fluids in the hospital she continues to have positive orthostatic vitals. Per H&P, the patient was previously on amiodarone, Coreg and lisinopril which were discontinued due to her complaints of dizziness.  Subjective: She continues to have a headache and nausea but is still able to eat and drink. No dizziness when walking yesterday.   Assessment/Plan: Principal Problem:   Nausea and vomiting - symptoms occurring after CABG per patient -I have reviewed the new medications that she was discharged home with after her CABG and I'm doubtful that any of these will predispose her to nausea.-Many medications are on hold. -She states that symptoms have been improving and she is now able to eat and drink - continue Pepcid and PRN Zofran   Active Problems:   Orthostatic hypotension -I have noted that she was sent home after her CABG with midodrine and I suspect that the diagnosis of orthostatic hypotension was made while she was admitted for the procedure- midodrine did not resolve her symptoms -As she was initially hypovolemic due to poor PO intake in relation to above mentioned nausea, I hydrated her aggressively.  - However, despite being adequately hydrated she continued to be significantly orthostatic by vital signs and by  symptoms -TSH normal on 4/29- 3.457 - Baseline cortisol was normal-cosyntropin stimulation test normal as well - have spoken with Dr Buddy Duty (endocrinology) who recommended obtaining aldosterone- renin level and free T4 - Free T4 normal - he also recommended a trial of Florinef which I started on 5/4 - orthostatic vitals noted to be improved today with only a 10 mm mercury drop in BP and 10 bpm rise in HR - if aldosterone/renin ratio normal, orthostatic hypotension may be related to autonomic dysfunction in relation to diabetes - becoming hypokalemic with Fludrocortisone- will transition to Pyridostigmine and follow   Pleural effusion/ pneumonia - left sided effusion noted on CT neck incidentally- she is not hypoxia or short of breath with exertion but tells me today that she is now bringing up green sputum - left mid lung airspace disease noted on CXR - will need to start coverage for pneumonia with Vanc and Cefepime  -effusion noted on CXR on 07/07/14 therefore likely secondary to CABG and not pneumonia- no need to tap effusion just yet- not hypoxia or dyspneic   Hypokalemia - possibly due to Fludrocortisone- replace and recheck this evening  Acute/ subacute multifocal CVA - obtained MRI on 5/4 to further evaluate symptoms of headache in relation to orthostasis/ possible adrenal/ pituitary insufficiency  - this revealed multifocal infarcts in corona radiata bilaterally in the watershed area likely secondary to hypotension in relation to orthostasis earlier on in the patient's hospital stay- BP on 5/2 was noted to be in systolic 28N dropping to 86V during standing- - BP now significantly improved- Patient is asymptomatic from above infarcts - ECHO  already performed and found to be normal- see report below - carotid duplex ordered - CTA neck/head does not reveal any stenosis - already on Plavix and Lipitor- aspirin 81 mg added by Neurology and discontinued later    Diabetes mellitus type 2,  uncontrolled- is not on insulin at home -last A1c on 4/29 was 8.2 but previously her A1c's were uncontrolled at 11 -sugars are currently reasonably controlled in the hospital on low-dose Levemir and sliding scale insulin -Tradjenta and metformin on hold    UTI (lower urinary tract infection) -culture growing Klebsiella pneumonia which is pansensitive-she initially received ciprofloxacin and then Rocephin-  -  She has had a full 3 days of treatment    S/P CABG x 3, April 16 - continue aspirin Plavix Lipitor  NSVT - had an episode when admitted in 1/16 - currently in sinus rhythm  Vitiligo   Appt with PCP: requested Code Status: Full code Family Communication: discussed lab results, imaging results and plan with patient in detail Disposition Plan: Home with home health PT DVT prophylaxis: heparin Consultants: Neuro Procedures: 2D ECHO Left ventricle: The cavity size was normal. There was mild concentric hypertrophy. Systolic function was vigorous. The estimated ejection fraction was in the range of 65% to 70%. Doppler parameters are consistent with abnormal left ventricular relaxation (grade 1 diastolic dysfunction).  Antibiotics: Anti-infectives    Start     Dose/Rate Route Frequency Ordered Stop   07/09/14 1430  cefTRIAXone (ROCEPHIN) 1 g in dextrose 5 % 50 mL IVPB - Premix  Status:  Discontinued    Comments:  Aware of PCN allergy   1 g 100 mL/hr over 30 Minutes Intravenous Every 24 hours 07/09/14 1333 07/11/14 1146   07/08/14 0200  ciprofloxacin (CIPRO) IVPB 400 mg  Status:  Discontinued     400 mg 200 mL/hr over 60 Minutes Intravenous Every 12 hours 07/08/14 0158 07/09/14 1333      Objective: Filed Weights   07/13/14 0500 07/14/14 0507 07/15/14 0609  Weight: 62.6 kg (138 lb 0.1 oz) 63.6 kg (140 lb 3.4 oz) 62.097 kg (136 lb 14.4 oz)    Intake/Output Summary (Last 24 hours) at 07/15/14 1242 Last data filed at 07/15/14 0730  Gross per 24 hour  Intake     480 ml  Output   1650 ml  Net  -1170 ml     Vitals Filed Vitals:   07/14/14 0507 07/14/14 1308 07/14/14 2116 07/15/14 0609  BP: 133/64 170/79  164/73  Pulse: 99 94  99  Temp: 97.9 F (36.6 C) 98.2 F (36.8 C) 98.2 F (36.8 C) 98.1 F (36.7 C)  TempSrc: Oral Oral Oral Oral  Resp: 18 18 18 18   Height:      Weight: 63.6 kg (140 lb 3.4 oz)   62.097 kg (136 lb 14.4 oz)  SpO2: 94% 98% 95% 98%    Exam:  General:  Pt is alert, not in acute distress  HEENT: No icterus, No thrush  Cardiovascular: regular rate and rhythm, S1/S2 No murmur  Respiratory: clear to auscultation bilaterally   Abdomen: Soft, +Bowel sounds, non tender, non distended, no guarding  MSK: No LE edema, cyanosis or clubbing  Data Reviewed: Basic Metabolic Panel:  Recent Labs Lab 07/09/14 0403 07/11/14 0424 07/13/14 0915 07/14/14 0500 07/15/14 0320  NA 135 139 140 140 139  K 3.5 3.5 3.7 2.9* 2.7*  CL 107 109 109 110 106  CO2 18* 23 22 22 23   GLUCOSE 223* 86 166* 117* 95  BUN 11 <5* <5* <5* <5*  CREATININE 0.97 1.07* 0.95 0.84 0.74  CALCIUM 8.4 8.4* 8.6* 8.4* 8.6*   Liver Function Tests:  Recent Labs Lab 07/09/14 0403  AST 19  ALT 12  ALKPHOS 76  BILITOT 0.5  PROT 5.9*  ALBUMIN 2.8*   No results for input(s): LIPASE, AMYLASE in the last 168 hours. No results for input(s): AMMONIA in the last 168 hours. CBC:  Recent Labs Lab 07/09/14 0403 07/11/14 0424 07/15/14 0954  WBC 6.5 6.9 6.4  HGB 10.8* 11.4* 10.8*  HCT 31.9* 33.0* 31.8*  MCV 89.9 88.9 89.3  PLT 302 294 254   Cardiac Enzymes:  Recent Labs Lab 07/08/14 1302 07/08/14 1806  TROPONINI 0.03 0.03   BNP (last 3 results)  Recent Labs  07/07/14 2210  BNP 125.5*    ProBNP (last 3 results) No results for input(s): PROBNP in the last 8760 hours.  CBG:  Recent Labs Lab 07/14/14 1159 07/14/14 1558 07/14/14 2157 07/15/14 0705 07/15/14 1103  GLUCAP 99 131* 101* 109* 109*    Recent Results (from the past 240  hour(s))  Blood Culture (routine x 2)     Status: None   Collection Time: 07/07/14 10:10 PM  Result Value Ref Range Status   Specimen Description BLOOD LEFT ARM  Final   Special Requests BOTTLES DRAWN AEROBIC AND ANAEROBIC 6CC  Final   Culture   Final    NO GROWTH 5 DAYS Performed at [facility]    Report Status 07/14/2014 FINAL  Final  Blood Culture (routine x 2)     Status: None   Collection Time: 07/07/14 10:10 PM  Result Value Ref Range Status   Specimen Description BLOOD RIGHT ARM  Final   Special Requests BOTTLES DRAWN AEROBIC ONLY 10CC  Final   Culture   Final    NO GROWTH 5 DAYS Performed at [facility]    Report Status 07/14/2014 FINAL  Final  Urine culture     Status: None   Collection Time: 07/07/14 11:35 PM  Result Value Ref Range Status   Specimen Description URINE, RANDOM  Final   Special Requests NONE  Final   Colony Count   Final    >=100,000 COLONIES/ML Performed at [facility]    Culture   Final    KLEBSIELLA PNEUMONIAE Performed at [facility]    Report Status 07/10/2014 FINAL  Final   Organism ID, Bacteria KLEBSIELLA PNEUMONIAE  Final      Susceptibility   Klebsiella pneumoniae - MIC*    AMPICILLIN RESISTANT      CEFAZOLIN <=4 SENSITIVE Sensitive     CEFTRIAXONE <=1 SENSITIVE Sensitive     CIPROFLOXACIN <=0.25 SENSITIVE Sensitive     GENTAMICIN <=1 SENSITIVE Sensitive     LEVOFLOXACIN <=0.12 SENSITIVE Sensitive     NITROFURANTOIN 32 SENSITIVE Sensitive     TOBRAMYCIN <=1 SENSITIVE Sensitive     TRIMETH/SULFA <=20 SENSITIVE Sensitive     PIP/TAZO <=4 SENSITIVE Sensitive     * KLEBSIELLA PNEUMONIAE     Studies: Ct Angio Head W/cm &/or Wo Cm  07/14/2014   CLINICAL DATA:  BILATERAL deep watershed infarcts of uncertain etiology. Patient presented with nausea, vomiting, dizziness, and generalized weakness. Recent CABG 06/25/2014.  EXAM: CT ANGIOGRAPHY HEAD AND NECK  TECHNIQUE: Multidetector CT imaging of the head  and neck was performed using the standard protocol during bolus administration of intravenous contrast. Multiplanar CT image reconstructions and MIPs were obtained to evaluate the  vascular anatomy. Carotid stenosis measurements (when applicable) are obtained utilizing NASCET criteria, using the distal internal carotid diameter as the denominator.  CONTRAST:  167mL OMNIPAQUE IOHEXOL 350 MG/ML SOLN  COMPARISON:  MR brain 07/13/2014.  FINDINGS: CT HEAD  Calvarium and skull base: No fracture or destructive lesion. Mastoids and middle ears are grossly clear.  Paranasal sinuses: Imaged portions are clear.  Orbits: Negative.  Brain: No evidence of acute abnormality, including acute infarct, hemorrhage, hydrocephalus, or mass lesion. Areas of hypoattenuation representing acute infarction are better appreciated on MR.  CTA NECK  Aortic arch: Bovine origin LEFT common carotid from the innominate Imaged portion shows no evidence of aneurysm or dissection. No significant stenosis of the major arch vessel origins.  Right carotid system: Mild non stenotic plaque at the bifurcation. No evidence of dissection, stenosis (50% or greater) or occlusion.  Left carotid system: Mild non stenotic plaque at the bifurcation. No evidence of dissection, stenosis (50% or greater) or occlusion.  Vertebral arteries: LEFT vertebral dominant. No evidence of dissection, stenosis (50% or greater) or occlusion.  Nonvascular soft tissues: Large LEFT pleural effusion. Previous median sternotomy for CABG. No neck masses.  CTA HEAD  Anterior circulation: Mild calcific change in the cavernous and supraclinoid internal carotid arteries without flow reducing lesion. Mild irregularity of the A1, and M1 segments of the anterior, and middle cerebral arteries without flow reducing lesion. No MCA branch occlusion. No significant stenosis, proximal occlusion, aneurysm, or vascular malformation.  Posterior circulation: No proximal P1 stenosis or cerebellar branch  occlusion. No significant stenosis, proximal occlusion, aneurysm, or vascular malformation. Diminutive RIGHT vertebral distal to the PICA origin. LEFT vertebral dominant contributor.  Venous sinuses: As permitted by contrast timing, patent.  Anatomic variants:  None of significance.  Delayed phase:   No abnormal intracranial enhancement.  IMPRESSION: No extracranial or intracranial flow reducing lesion.  Mild irregularity of the carotid siphon, proximal ACA and MCA vessels without flow reducing lesion.  The areas of acute to subacute infarction are better demonstrated on MR.  Large LEFT pleural effusion status post recent CABG. CTS consultation may be warranted.   Electronically Signed   By: Rolla Flatten M.D.   On: 07/14/2014 17:35   Dg Chest 2 View  07/15/2014   CLINICAL DATA:  69 year old female with a history of pleural effusion  EXAM: CHEST - 2 VIEW  COMPARISON:  07/07/2014, 03/24/2014  FINDINGS: Cardiomediastinal silhouette unchanged. Atherosclerotic calcifications of the aortic arch. Surgical changes of prior median sternotomy.  Increasing size of left-sided pleural effusion with obscuration of the hemidiaphragm of the left heart border. Airspace disease developing within the left mid lung.  Trace effusion within the fissure on the lateral view.  Stigmata of emphysema, with increased retrosternal airspace, flattened hemidiaphragms, increased AP diameter, and hyperinflation on the AP view.  Right lung relatively well aerated.  No displaced fracture.  Configuration of vertebral bodies unchanged.  Unremarkable appearance of the upper abdomen.  IMPRESSION: Increasing size of right-sided pleural effusion and airspace disease, which potentially could be pneumonia and parapneumonic effusion. Correlation with patient presentation recommended.  Changes of emphysema.  Atherosclerosis.  Signed,  Dulcy Fanny. Earleen Newport, DO  [facility]  [facility]   Electronically Signed   By:  Corrie Mckusick D.O.   On: 07/15/2014 09:27   Ct Angio Neck W/cm &/or Wo/cm  07/14/2014   CLINICAL DATA:  BILATERAL deep watershed infarcts of uncertain etiology. Patient presented with nausea, vomiting, dizziness, and generalized weakness. Recent CABG  06/25/2014.  EXAM: CT ANGIOGRAPHY HEAD AND NECK  TECHNIQUE: Multidetector CT imaging of the head and neck was performed using the standard protocol during bolus administration of intravenous contrast. Multiplanar CT image reconstructions and MIPs were obtained to evaluate the vascular anatomy. Carotid stenosis measurements (when applicable) are obtained utilizing NASCET criteria, using the distal internal carotid diameter as the denominator.  CONTRAST:  171mL OMNIPAQUE IOHEXOL 350 MG/ML SOLN  COMPARISON:  MR brain 07/13/2014.  FINDINGS: CT HEAD  Calvarium and skull base: No fracture or destructive lesion. Mastoids and middle ears are grossly clear.  Paranasal sinuses: Imaged portions are clear.  Orbits: Negative.  Brain: No evidence of acute abnormality, including acute infarct, hemorrhage, hydrocephalus, or mass lesion. Areas of hypoattenuation representing acute infarction are better appreciated on MR.  CTA NECK  Aortic arch: Bovine origin LEFT common carotid from the innominate Imaged portion shows no evidence of aneurysm or dissection. No significant stenosis of the major arch vessel origins.  Right carotid system: Mild non stenotic plaque at the bifurcation. No evidence of dissection, stenosis (50% or greater) or occlusion.  Left carotid system: Mild non stenotic plaque at the bifurcation. No evidence of dissection, stenosis (50% or greater) or occlusion.  Vertebral arteries: LEFT vertebral dominant. No evidence of dissection, stenosis (50% or greater) or occlusion.  Nonvascular soft tissues: Large LEFT pleural effusion. Previous median sternotomy for CABG. No neck masses.  CTA HEAD  Anterior circulation: Mild calcific change in the cavernous and supraclinoid  internal carotid arteries without flow reducing lesion. Mild irregularity of the A1, and M1 segments of the anterior, and middle cerebral arteries without flow reducing lesion. No MCA branch occlusion. No significant stenosis, proximal occlusion, aneurysm, or vascular malformation.  Posterior circulation: No proximal P1 stenosis or cerebellar branch occlusion. No significant stenosis, proximal occlusion, aneurysm, or vascular malformation. Diminutive RIGHT vertebral distal to the PICA origin. LEFT vertebral dominant contributor.  Venous sinuses: As permitted by contrast timing, patent.  Anatomic variants:  None of significance.  Delayed phase:   No abnormal intracranial enhancement.  IMPRESSION: No extracranial or intracranial flow reducing lesion.  Mild irregularity of the carotid siphon, proximal ACA and MCA vessels without flow reducing lesion.  The areas of acute to subacute infarction are better demonstrated on MR.  Large LEFT pleural effusion status post recent CABG. CTS consultation may be warranted.   Electronically Signed   By: Rolla Flatten M.D.   On: 07/14/2014 17:35   Mr Jeri Cos YB Contrast  07/13/2014   CLINICAL DATA:  Dizziness. Evaluate for pituitary mass. Recent presentation to emergency department with hypotension.  EXAM: MRI HEAD WITHOUT AND WITH CONTRAST  TECHNIQUE: Multiplanar, multiecho pulse sequences of the brain and surrounding structures were obtained without and with intravenous contrast.  CONTRAST:  26mL MULTIHANCE GADOBENATE DIMEGLUMINE 529 MG/ML IV SOLN  COMPARISON:  Head CT 07/08/2014  FINDINGS: Calvarium and upper cervical spine: No marrow signal abnormality.  Orbits: Bilateral cataract resection.  Sinuses: Clear. Probable mild mucosal thickening in the bilateral mastoid tips.  Brain: Small (1cm or smaller) acute infarcts in the bilateral corona radiata, symmetric in pattern although more extensive on the left. The left corona radiata signal abnormality is enhancing, suggesting  subacute timing. No masslike enhancement is suspected.  The pituitary gland has normal size and enhancement for age. Normal chiasm and cavernous sinuses.  When accounting for FLAIR hyperintensity at the sites of acute infarction, there is no white matter disease for age. Mild generalized cerebral volume loss, mildly advanced for  age.  No hydrocephalus.  No evidence of major vessel occlusion.  IMPRESSION: 1. Small acute to subacute infarcts in the bilateral corona radiata. Distribution and history of recurrent hypotension suggests deep watershed injury. 2. Normal pituitary gland.   Electronically Signed   By: Monte Fantasia M.D.   On: 07/13/2014 16:36    Scheduled Meds:  Scheduled Meds: . atorvastatin  20 mg Oral QHS  . clopidogrel  75 mg Oral Daily  . famotidine  20 mg Oral QHS  . feeding supplement (ENSURE ENLIVE)  237 mL Oral BID BM  . fludrocortisone  0.1 mg Oral Daily  . folic acid  1 mg Oral Daily  . gabapentin  100 mg Oral TID  . heparin  5,000 Units Subcutaneous 3 times per day  . insulin aspart  0-15 Units Subcutaneous TID WC  . insulin aspart  0-5 Units Subcutaneous QHS  . insulin detemir  5 Units Subcutaneous QHS  . ondansetron  4 mg Oral QID   Or  . ondansetron (ZOFRAN) IV  4 mg Intravenous QID  . potassium chloride  10 mEq Intravenous Q1 Hr x 4  . potassium chloride  40 mEq Oral Q6H  . sertraline  50 mg Oral Daily  . sodium chloride  3 mL Intravenous Q12H   Continuous Infusions: . sodium chloride 50 mL/hr at 07/14/14 1742    Time spent on care of this patient: 35 min   [place], MD 07/15/2014, 12:42 PM  LOS: 7 days   [facility] Office  [phone] Pager - Text Page per [email or web address]  If 7PM-7AM, please contact night-coverage [email or web address]

## 2014-07-15 NOTE — Care Management Note (Addendum)
Case Management Note  Patient Details  Name: Maria Fox MRN: [id number] Date of Birth: Apr 19, 1945  Subjective/Objective:      Pt admitted Nausea and vomiting            Action/Plan:  PT/OT evaluation.  CM will continue to monitor for disposition needs   Expected Discharge Date:  07/16/14               Expected Discharge Plan:  Home/Self Care (Pt requested to go to outpt rehabilitation center intead of [facility])  In-House Referral:     Discharge planning Services  CM Consult  Post Acute Care Choice:    Choice offered to:     DME Arranged:    DME Agency:     HH Arranged:    [place] Agency:     Status of Service:   Complete, will sign off  Medicare Important Message Given:  Yes Date Medicare IM Given:  07/15/14 Medicare IM give by:  Elenor Quinones Date Additional Medicare IM Given:    Additional Medicare Important Message give by:     If discussed at [place] of Stay Meetings, dates discussed:    Additional Comments:  07/15/14 Elenor Quinones RN BSN CM; Pt refused HH due to "my home is not safe for people to come in", when CM asked for additonal information, pt stated the home was under construction and they she did not want people in home.  Pt requested to attend an outpt facility for rehab, pt chose [facility] facility [phone].  Since pt refused HH; aide order can not be fulfilled, MD made aware.  CM contacted MD and asked if order could be modified from [facility] to outp services, MD agreed.  CM sent referral via Epic to center per pt request.  CM called to pt room and informed that referral had been sent, follow-up provided on AVS.  [name], RN 07/15/2014, 12:06 PM

## 2014-07-15 NOTE — Progress Notes (Signed)
Physical Therapy Treatment Patient Details Name: Maria Fox MRN: [id number] DOB: 1945/11/24 Today's Date: 07/15/2014    History of Present Illness Pt is a 69 y/o female admitted with dizziness and nausea. Pt recently underwent a CABG that was complicated by a CVA. Pt d/c'd to SNF, returned home and later in the week returned to the ED with above listed symptoms and hypotension.     PT Comments    Pt with fair rehab effort this session. Prior to PT arriving, nursing reports that pt asking to get up and walk however required moderate encouragement to participate in PT session. Pt continues to complain of nausea and dizziness with mobility, and BP after walking to/from bathroom was [facility] (131/65). Will continue to follow and progress as able per POC.   Follow Up Recommendations  Home health PT;Supervision/Assistance - 24 hour     Equipment Recommendations  Other (comment)    Recommendations for Other Services       Precautions / Restrictions Precautions Precautions: Fall;Sternal Restrictions Weight Bearing Restrictions: No    Mobility  Bed Mobility Overal bed mobility: Needs Assistance Bed Mobility: Supine to Sit     Supine to sit: Supervision     General bed mobility comments: VC's to maintain sternal precautions.  Transfers Overall transfer level: Needs assistance Equipment used: None Transfers: Sit to/from Stand Sit to Stand: Min guard         General transfer comment: Min guard for safety. No sway or LOB. Increased time required.   Ambulation/Gait Ambulation/Gait assistance: Min guard Ambulation Distance (Feet): 225 Feet Assistive device: None Gait Pattern/deviations: Step-through pattern;Decreased stride length;Trunk flexed;Narrow base of support Gait velocity: Decreased Gait velocity interpretation: Below normal speed for age/gender General Gait Details: Slow and guarded. Pt insisting on holding onto the IV pole however declined use of RW. Unsteady without  UE support. On RA and sats in mid 90's.   Stairs            Wheelchair Mobility    Modified Rankin (Stroke Patients Only)       Balance Overall balance assessment: Needs assistance Sitting-balance support: Feet supported;No upper extremity supported Sitting balance-Leahy Scale: Fair     Standing balance support: No upper extremity supported Standing balance-Leahy Scale: Fair                      Cognition Arousal/Alertness: Awake/alert Behavior During Therapy: Flat affect Overall Cognitive Status: Within Functional Limits for tasks assessed                      Exercises General Exercises - Lower Extremity Long Arc Quad: 10 reps;Both Hip ABduction/ADduction: 10 reps (isometric pillow squeeze as well as resisted abduction)    General Comments        Pertinent Vitals/Pain Pain Assessment: Faces Faces Pain Scale: Hurts little more Pain Location: R axilla burning (RN thinks maybe from IV potassium?) Pain Intervention(s): Limited activity within patient's tolerance;Monitored during session;Repositioned    Home Living                      Prior Function            PT Goals (current goals can now be found in the care plan section) Acute Rehab PT Goals Patient Stated Goal: not stated PT Goal Formulation: With patient Time For Goal Achievement: 07/16/14 Potential to Achieve Goals: Good Progress towards PT goals: Progressing toward goals    Frequency  Min 3X/week    PT Plan Current plan remains appropriate    Co-evaluation             End of Session Equipment Utilized During Treatment: Gait belt Activity Tolerance: Treatment limited secondary to medical complications (Comment) (Nausea, dizziness) Patient left: in chair;with call bell/phone within reach;with family/visitor present     Time: 7322-0254 PT Time Calculation (min) (ACUTE ONLY): 32 min  Charges:  $Gait Training: 8-22 mins $Therapeutic Activity: 8-22  mins                    G Codes:      Rolinda Roan Jul 19, 2014, 2:57 PM   Rolinda Roan, PT, DPT Acute Rehabilitation Services Pager: [phone]

## 2014-07-15 NOTE — Progress Notes (Signed)
STROKE TEAM PROGRESS NOTE   HISTORY Maria Fox is an 69 y.o. female with a past medical history significant for HTN, DM, hyperlipidemia, diabetic neuropathy, vitiligo,  CAD w/ NSTEMI s/p CABG 4/16 , discharged to rehab then to home. She was admitted 6 days ago to [facility] for continued dizziness, nausea, and generalized weakness. Neuro was consulted for further evaluation of HA, nausea, vomiting, orthostatic hypotension and positive stroke on MRI. Maria Fox indicated that " I am not a HA person but since this past Sunday I have been having a daily, dull, constant HA". There is not associated focal weakness, slurred speech, double vision, difficulty swallowing, or visual disturbances. On the other hand, she reports daily nausea, occasional vomiting, and lightheadedness since heart surgery on 4/16. Of note, orthostatic hypotension have been noted since surgery and at some point she was on midodrine.  She states that symptoms have been improving and she is now able to eat and drink.  Her symptoms prompted MRI brain that was reviewed and demonstrated findings suggestive of small acute to subacute infarcts in the bilateral corona radiata. Pituitary gland is normal. TSH normal on 4/29- 3.457. Baseline cortisol was normal-cosyntropin stimulation test normal as well. Aldosterone- renin level and free T4 are pending. She is on aspirin and plavix. Date last known well unclear. tPA was not considered for tpa d/t recent surgery.  Patient was not administered TPA secondary to delay in arrival, recent surgery. She was admitted for further evaluation and treatment.   SUBJECTIVE (INTERVAL HISTORY) Her family is not at the bedside.  Overall she feels her condition is unchanged. She still has nausea, light-headedness and feels weak. She complains of orthostatic dizziness. She could not tolerate Florinef and midodrine and it may be worth trying low-dose Mestinon. CT angiograms show no significant large vessel stenosis or  occlusion.   OBJECTIVE Temp:  [98.1 F (36.7 C)-98.5 F (36.9 C)] 98.5 F (36.9 C) (05/06 1309) Pulse Rate:  [94-99] 94 (05/06 1309) Cardiac Rhythm:  [-] Normal sinus rhythm;Sinus tachycardia (05/05 2204) Resp:  [18] 18 (05/06 1309) BP: (164-168)/(60-73) 168/60 mmHg (05/06 1309) SpO2:  [94 %-98 %] 94 % (05/06 1309) Weight:  [136 lb 14.4 oz (62.097 kg)] 136 lb 14.4 oz (62.097 kg) (05/06 0609)   Recent Labs Lab 07/14/14 1159 07/14/14 1558 07/14/14 2157 07/15/14 0705 07/15/14 1103  GLUCAP 99 131* 101* 109* 109*    Recent Labs Lab 07/09/14 0403 07/11/14 0424 07/13/14 0915 07/14/14 0500 07/15/14 0320  NA 135 139 140 140 139  K 3.5 3.5 3.7 2.9* 2.7*  CL 107 109 109 110 106  CO2 18* 23 22 22 23   GLUCOSE 223* 86 166* 117* 95  BUN 11 <5* <5* <5* <5*  CREATININE 0.97 1.07* 0.95 0.84 0.74  CALCIUM 8.4 8.4* 8.6* 8.4* 8.6*    Recent Labs Lab 07/09/14 0403  AST 19  ALT 12  ALKPHOS 76  BILITOT 0.5  PROT 5.9*  ALBUMIN 2.8*    Recent Labs Lab 07/09/14 0403 07/11/14 0424 07/15/14 0954  WBC 6.5 6.9 6.4  HGB 10.8* 11.4* 10.8*  HCT 31.9* 33.0* 31.8*  MCV 89.9 88.9 89.3  PLT 302 294 254    Recent Labs Lab 07/08/14 1806  TROPONINI 0.03   No results for input(s): LABPROT, INR in the last 72 hours. No results for input(s): COLORURINE, LABSPEC, [place], GLUCOSEU, HGBUR, BILIRUBINUR, KETONESUR, PROTEINUR, UROBILINOGEN, NITRITE, LEUKOCYTESUR in the last 72 hours.  Invalid input(s): APPERANCEUR     Component Value Date/Time  CHOL 107 07/15/2014 0321   TRIG 144 07/15/2014 0321   HDL 30* 07/15/2014 0321   CHOLHDL 3.6 07/15/2014 0321   VLDL 29 07/15/2014 0321   LDLCALC 48 07/15/2014 0321   Lab Results  Component Value Date   HGBA1C 8.2* 07/08/2014   No results found for: LABOPIA, COCAINSCRNUR, LABBENZ, AMPHETMU, THCU, LABBARB  No results for input(s): ETH in the last 168 hours.  Ct Angio Head W/cm &/or Wo Cm  07/14/2014   CLINICAL DATA:  BILATERAL deep  watershed infarcts of uncertain etiology. Patient presented with nausea, vomiting, dizziness, and generalized weakness. Recent CABG 06/25/2014.  EXAM: CT ANGIOGRAPHY HEAD AND NECK  TECHNIQUE: Multidetector CT imaging of the head and neck was performed using the standard protocol during bolus administration of intravenous contrast. Multiplanar CT image reconstructions and MIPs were obtained to evaluate the vascular anatomy. Carotid stenosis measurements (when applicable) are obtained utilizing NASCET criteria, using the distal internal carotid diameter as the denominator.  CONTRAST:  169mL OMNIPAQUE IOHEXOL 350 MG/ML SOLN  COMPARISON:  MR brain 07/13/2014.  FINDINGS: CT HEAD  Calvarium and skull base: No fracture or destructive lesion. Mastoids and middle ears are grossly clear.  Paranasal sinuses: Imaged portions are clear.  Orbits: Negative.  Brain: No evidence of acute abnormality, including acute infarct, hemorrhage, hydrocephalus, or mass lesion. Areas of hypoattenuation representing acute infarction are better appreciated on MR.  CTA NECK  Aortic arch: Bovine origin LEFT common carotid from the innominate Imaged portion shows no evidence of aneurysm or dissection. No significant stenosis of the major arch vessel origins.  Right carotid system: Mild non stenotic plaque at the bifurcation. No evidence of dissection, stenosis (50% or greater) or occlusion.  Left carotid system: Mild non stenotic plaque at the bifurcation. No evidence of dissection, stenosis (50% or greater) or occlusion.  Vertebral arteries: LEFT vertebral dominant. No evidence of dissection, stenosis (50% or greater) or occlusion.  Nonvascular soft tissues: Large LEFT pleural effusion. Previous median sternotomy for CABG. No neck masses.  CTA HEAD  Anterior circulation: Mild calcific change in the cavernous and supraclinoid internal carotid arteries without flow reducing lesion. Mild irregularity of the A1, and M1 segments of the anterior, and  middle cerebral arteries without flow reducing lesion. No MCA branch occlusion. No significant stenosis, proximal occlusion, aneurysm, or vascular malformation.  Posterior circulation: No proximal P1 stenosis or cerebellar branch occlusion. No significant stenosis, proximal occlusion, aneurysm, or vascular malformation. Diminutive RIGHT vertebral distal to the PICA origin. LEFT vertebral dominant contributor.  Venous sinuses: As permitted by contrast timing, patent.  Anatomic variants:  None of significance.  Delayed phase:   No abnormal intracranial enhancement.  IMPRESSION: No extracranial or intracranial flow reducing lesion.  Mild irregularity of the carotid siphon, proximal ACA and MCA vessels without flow reducing lesion.  The areas of acute to subacute infarction are better demonstrated on MR.  Large LEFT pleural effusion status post recent CABG. CTS consultation may be warranted.   Electronically Signed   By: Rolla Flatten M.D.   On: 07/14/2014 17:35   Dg Chest 2 View  07/15/2014   CLINICAL DATA:  69 year old female with a history of pleural effusion  EXAM: CHEST - 2 VIEW  COMPARISON:  07/07/2014, 03/24/2014  FINDINGS: Cardiomediastinal silhouette unchanged. Atherosclerotic calcifications of the aortic arch. Surgical changes of prior median sternotomy.  Increasing size of left-sided pleural effusion with obscuration of the hemidiaphragm of the left heart border. Airspace disease developing within the left mid lung.  Trace effusion  within the fissure on the lateral view.  Stigmata of emphysema, with increased retrosternal airspace, flattened hemidiaphragms, increased AP diameter, and hyperinflation on the AP view.  Right lung relatively well aerated.  No displaced fracture.  Configuration of vertebral bodies unchanged.  Unremarkable appearance of the upper abdomen.  IMPRESSION: Increasing size of right-sided pleural effusion and airspace disease, which potentially could be pneumonia and parapneumonic  effusion. Correlation with patient presentation recommended.  Changes of emphysema.  Atherosclerosis.  Signed,  Dulcy Fanny. Earleen Newport, DO  [facility]  [facility]   Electronically Signed   By: Corrie Mckusick D.O.   On: 07/15/2014 09:27   Ct Angio Neck W/cm &/or Wo/cm  07/14/2014   CLINICAL DATA:  BILATERAL deep watershed infarcts of uncertain etiology. Patient presented with nausea, vomiting, dizziness, and generalized weakness. Recent CABG 06/25/2014.  EXAM: CT ANGIOGRAPHY HEAD AND NECK  TECHNIQUE: Multidetector CT imaging of the head and neck was performed using the standard protocol during bolus administration of intravenous contrast. Multiplanar CT image reconstructions and MIPs were obtained to evaluate the vascular anatomy. Carotid stenosis measurements (when applicable) are obtained utilizing NASCET criteria, using the distal internal carotid diameter as the denominator.  CONTRAST:  172mL OMNIPAQUE IOHEXOL 350 MG/ML SOLN  COMPARISON:  MR brain 07/13/2014.  FINDINGS: CT HEAD  Calvarium and skull base: No fracture or destructive lesion. Mastoids and middle ears are grossly clear.  Paranasal sinuses: Imaged portions are clear.  Orbits: Negative.  Brain: No evidence of acute abnormality, including acute infarct, hemorrhage, hydrocephalus, or mass lesion. Areas of hypoattenuation representing acute infarction are better appreciated on MR.  CTA NECK  Aortic arch: Bovine origin LEFT common carotid from the innominate Imaged portion shows no evidence of aneurysm or dissection. No significant stenosis of the major arch vessel origins.  Right carotid system: Mild non stenotic plaque at the bifurcation. No evidence of dissection, stenosis (50% or greater) or occlusion.  Left carotid system: Mild non stenotic plaque at the bifurcation. No evidence of dissection, stenosis (50% or greater) or occlusion.  Vertebral arteries: LEFT vertebral dominant. No evidence of dissection,  stenosis (50% or greater) or occlusion.  Nonvascular soft tissues: Large LEFT pleural effusion. Previous median sternotomy for CABG. No neck masses.  CTA HEAD  Anterior circulation: Mild calcific change in the cavernous and supraclinoid internal carotid arteries without flow reducing lesion. Mild irregularity of the A1, and M1 segments of the anterior, and middle cerebral arteries without flow reducing lesion. No MCA branch occlusion. No significant stenosis, proximal occlusion, aneurysm, or vascular malformation.  Posterior circulation: No proximal P1 stenosis or cerebellar branch occlusion. No significant stenosis, proximal occlusion, aneurysm, or vascular malformation. Diminutive RIGHT vertebral distal to the PICA origin. LEFT vertebral dominant contributor.  Venous sinuses: As permitted by contrast timing, patent.  Anatomic variants:  None of significance.  Delayed phase:   No abnormal intracranial enhancement.  IMPRESSION: No extracranial or intracranial flow reducing lesion.  Mild irregularity of the carotid siphon, proximal ACA and MCA vessels without flow reducing lesion.  The areas of acute to subacute infarction are better demonstrated on MR.  Large LEFT pleural effusion status post recent CABG. CTS consultation may be warranted.   Electronically Signed   By: Rolla Flatten M.D.   On: 07/14/2014 17:35   Mr Jeri Cos BU Contrast  07/13/2014   CLINICAL DATA:  Dizziness. Evaluate for pituitary mass. Recent presentation to emergency department with hypotension.  EXAM: MRI HEAD WITHOUT AND WITH CONTRAST  TECHNIQUE:  Multiplanar, multiecho pulse sequences of the brain and surrounding structures were obtained without and with intravenous contrast.  CONTRAST:  94mL MULTIHANCE GADOBENATE DIMEGLUMINE 529 MG/ML IV SOLN  COMPARISON:  Head CT 07/08/2014  FINDINGS: Calvarium and upper cervical spine: No marrow signal abnormality.  Orbits: Bilateral cataract resection.  Sinuses: Clear. Probable mild mucosal thickening in the  bilateral mastoid tips.  Brain: Small (1cm or smaller) acute infarcts in the bilateral corona radiata, symmetric in pattern although more extensive on the left. The left corona radiata signal abnormality is enhancing, suggesting subacute timing. No masslike enhancement is suspected.  The pituitary gland has normal size and enhancement for age. Normal chiasm and cavernous sinuses.  When accounting for FLAIR hyperintensity at the sites of acute infarction, there is no white matter disease for age. Mild generalized cerebral volume loss, mildly advanced for age.  No hydrocephalus.  No evidence of major vessel occlusion.  IMPRESSION: 1. Small acute to subacute infarcts in the bilateral corona radiata. Distribution and history of recurrent hypotension suggests deep watershed injury. 2. Normal pituitary gland.   Electronically Signed   By: Monte Fantasia M.D.   On: 07/13/2014 16:36   2D Echocardiogram   - Left ventricle: The cavity size was normal. Wall thickness wasincreased in a pattern of mild LVH. Systolic function was normal.The estimated ejection fraction was in the range of 55% to 60%.Wall motion was normal; there were no regional wall motionabnormalities. Doppler parameters are consistent with abnormalleft ventricular relaxation (grade 1 diastolic dysfunction).   PHYSICAL EXAM Pleasant elderly lady not in distress. . Afebrile. Head is nontraumatic. Neck is supple without bruit.    Cardiac exam no murmur or gallop. Lungs are clear to auscultation. Distal pulses are well felt. Neurological Exam ;  Awake  Alert oriented x 3. Normal speech and language.eye movements full without nystagmus.fundi were not visualized. Vision acuity and fields appear normal. Hearing is normal. Palatal movements are normal. Face symmetric. Tongue midline. Normal strength, tone, reflexes and coordination. Normal sensation. Gait deferred. ASSESSMENT/PLAN Maria Fox is a 69 y.o. female with history of hypertension,  diabetes mellitus, migraines who recently underwent CABG 3 after an NSTEMI on 4/7 in [place] where she was visiting at the time, discharged to a rehabilitation facility and then discharged. She  presenting to [facility] with ongoing nausea, lightheadedness and vomiting. She is hypotensive. She did not receive IV t-PA due to being out of the time window as well as recent surgery.   Nausea/vomiting/dizziness, differential includes Incidental stroke from recent surgery vs symptomatic hypotension/hypovolemia  Resultant  Nausea, lightheadedness  MRI  Bilateral corona radiata infarcts  CT angio head as above   Carotid Doppler  canceled  2D Echo  No source of embolus   Heparin 5000 units sq tid for VTE prophylaxis Diet regular Room service appropriate?: Yes; Fluid consistency:: Thin  clopidogrel 75 mg orally every day prior to admission, now on aspirin 81 mg orally every day and clopidogrel 75 mg orally every day (new aspirin added this admission). Discontinue additional aspirin if no signs of large vessel disease found. Otherwise, continue both x 3 months then 1 alone  Ongoing aggressive stroke risk factor management  Therapy recommendations:  HH PT  Disposition:  Home with HH therapies   Hyperlipidemia  Home meds:  lipitor 20, resumed in hospital  LDL ordered, goal < 70  Continue statin at discharge  Diabetes, type 2, uncontrolled  HgbA1c 8.2, goal < 7.0  Uncontrolled  Other Stroke Risk Factors  Advanced age  No Family hx stroke Coronary artery disease - CABGx3 06/2014  Hx remote Migraines  Other Active Problems  Orthostatic Hypotension- RECOMMEND LOW-DOSE MESTINON 30 mg 3 times daily and if tolerated consider increasing it to 60 mg 3 times daily after a few days  UTI on rocephin  NSVT on admission, now in [facility] day # [place] for Pager information 07/15/2014 3:38 PM  I have personally examined this  patient, reviewed notes, independently viewed imaging studies, participated in medical decision making and plan of care. I have made any additions or clarifications directly to the above note. Agree with note above. She presented with nausea, vomiting, dizziness and generalized weakness without focal findings but MRI scan shows bilateral acute to subacute watershed looking infarcts. It is unclear whether these represent incidental strokes related to her recent cardiac surgery or not and presenting symptoms are unrelated. And likely related to orthostatic hypotension.  Recommend low-dose Mestinon. Stroke team will sign off. Currently call for questions. Discussed with Dr Wynelle Cleveland  . Antony Contras, MD Medical Director [facility] Stroke Center Pager: [phone] 07/15/2014 3:38 PM    To contact Stroke Continuity provider, please refer to [email or web address] After hours, contact General Neurology

## 2014-07-16 ENCOUNTER — Encounter: Payer: Self-pay | Admitting: Family Medicine

## 2014-07-16 LAB — GLUCOSE, CAPILLARY
Glucose-Capillary: 91 mg/dL (ref 70–99)
Glucose-Capillary: 132 mg/dL — ABNORMAL HIGH (ref 70–99)
Glucose-Capillary: 128 mg/dL — ABNORMAL HIGH (ref 70–99)
Glucose-Capillary: 82 mg/dL (ref 70–99)

## 2014-07-16 LAB — CLOSTRIDIUM DIFFICILE BY PCR: Toxigenic C. Difficile by PCR: NEGATIVE

## 2014-07-16 MED ORDER — DEXTROSE 5 % IV SOLN
1.0000 g | INTRAVENOUS | Status: DC
  Administered 2014-07-16 – 2014-07-18 (×3): 1 g via INTRAVENOUS
  Filled 2014-07-16 (×4): qty 1

## 2014-07-16 MED ORDER — STROKE: EARLY STAGES OF RECOVERY BOOK
Freq: Once | Status: AC
  Administered 2014-07-16: 11:00:00
  Filled 2014-07-16: qty 1

## 2014-07-16 MED ORDER — VANCOMYCIN HCL 500 MG IV SOLR
500.0000 mg | Freq: Two times a day (BID) | INTRAVENOUS | Status: DC
  Administered 2014-07-16 – 2014-07-18 (×5): 500 mg via INTRAVENOUS
  Filled 2014-07-16 (×6): qty 500

## 2014-07-16 NOTE — Progress Notes (Signed)
ANTIBIOTIC CONSULT NOTE  Pharmacy Consult for Vancomycin and Cefepime Indication: HCAP coverage  Allergies  Allergen Reactions  . Codeine Nausea And Vomiting  . Penicillins Hives and Swelling  . Sulfa Antibiotics Nausea And Vomiting    Patient Measurements: Height: 5\' 3"  (160 cm) Weight: 135 lb 1.6 oz (61.281 kg) IBW/kg (Calculated) : 52.4  Vital Signs: Temp: 98.1 F (36.7 C) (05/07 0517) Temp Source: Oral (05/07 0517) BP: 147/72 mmHg (05/07 0517) Pulse Rate: 89 (05/07 0517) Intake/Output from previous day: 05/06 0701 - 05/07 0700 In: 580 [P.O.:580] Out: 2651 [Urine:2650; Stool:1] Intake/Output from this shift: Total I/O In: -  Out: 350 [Urine:350]  Labs:  Recent Labs  07/14/14 0500 07/15/14 0320 07/15/14 0954 07/15/14 1814  WBC  --   --  6.4  --   HGB  --   --  10.8*  --   PLT  --   --  254  --   CREATININE 0.84 0.74  --  0.94   Estimated Creatinine Clearance: 45.4 mL/min (by C-G formula based on Cr of 0.94). No results for input(s): VANCOTROUGH, VANCOPEAK, VANCORANDOM, GENTTROUGH, GENTPEAK, GENTRANDOM, TOBRATROUGH, TOBRAPEAK, TOBRARND, AMIKACINPEAK, AMIKACINTROU, AMIKACIN in the last 72 hours.   Microbiology: Recent Results (from the past 720 hour(s))  Blood Culture (routine x 2)     Status: None   Collection Time: 07/07/14 10:10 PM  Result Value Ref Range Status   Specimen Description BLOOD LEFT ARM  Final   Special Requests BOTTLES DRAWN AEROBIC AND ANAEROBIC 6CC  Final   Culture   Final    NO GROWTH 5 DAYS Performed at [facility]    Report Status 07/14/2014 FINAL  Final  Blood Culture (routine x 2)     Status: None   Collection Time: 07/07/14 10:10 PM  Result Value Ref Range Status   Specimen Description BLOOD RIGHT ARM  Final   Special Requests BOTTLES DRAWN AEROBIC ONLY 10CC  Final   Culture   Final    NO GROWTH 5 DAYS Performed at [facility]    Report Status 07/14/2014 FINAL  Final  Urine culture     Status: None   Collection Time: 07/07/14 11:35 PM  Result Value Ref Range Status   Specimen Description URINE, RANDOM  Final   Special Requests NONE  Final   Colony Count   Final    >=100,000 COLONIES/ML Performed at [facility]    Culture   Final    KLEBSIELLA PNEUMONIAE Performed at [facility]    Report Status 07/10/2014 FINAL  Final   Organism ID, Bacteria KLEBSIELLA PNEUMONIAE  Final      Susceptibility   Klebsiella pneumoniae - MIC*    AMPICILLIN RESISTANT      CEFAZOLIN <=4 SENSITIVE Sensitive     CEFTRIAXONE <=1 SENSITIVE Sensitive     CIPROFLOXACIN <=0.25 SENSITIVE Sensitive     GENTAMICIN <=1 SENSITIVE Sensitive     LEVOFLOXACIN <=0.12 SENSITIVE Sensitive     NITROFURANTOIN 32 SENSITIVE Sensitive     TOBRAMYCIN <=1 SENSITIVE Sensitive     TRIMETH/SULFA <=20 SENSITIVE Sensitive     PIP/TAZO <=4 SENSITIVE Sensitive     * KLEBSIELLA PNEUMONIAE   Assessment:   To begin Vanc and Cefepime for HCAP coverage. Left-sided pleural effusion on CXR 07/15/14.  Afebrile, WBC 6.4.   Previously on Cipro 4/29>>4/30 then Cetriaxone 4/30>>5/2 for Klebsiella UTI.  Goal of Therapy:  Vancomycin trough level 15-20 mcg/ml appropriate Cefepime dose for renal function and infection  Plan:   Vancomycin 500 mg IV q12hrs.  Cefepime 1 gram IV q24hrs.  Will follow renal function, any culture data, clinical progress.  Arty Baumgartner, [place] Pager: [phone] 07/16/2014,9:29 AM

## 2014-07-16 NOTE — Progress Notes (Signed)
Pt encouraged to eat and drink Glucerna/Ensure, pt nausea and did not tolerate it very well.  Encouraged to ambulate, pt agreed to ambulate once this shift in hall.  Maria Fox 6:52 PM

## 2014-07-16 NOTE — Progress Notes (Signed)
[facility] Progress Note   Maria Fox XNA:[id number] DOB: 08-19-1945 DOA: 07/07/2014 PCP: [name], MD  Brief narrative: Maria Fox is a 69 y.o. female with PMH of hypertension, diabetes mellitus, migraines who recently underwent CABG 3 after an NSTEMI on 4/7 in [place] where she was visiting at the time.she was discharged to a rehabilitation facility and then discharged to home this past Tuesday. She claims to be nauseated and lightheaded ever since her CABG and was admitted for complaints of nausea and dizziness on 4/28. She continues to have severe nausea to the point where she is barely eating. She was noted to be orthostatic and despite receiving fluids in the hospital she continues to have positive orthostatic vitals. Per H&P, the patient was previously on amiodarone, Coreg and lisinopril which were discontinued due to her complaints of dizziness.  Subjective: Her headache continues- nausea is under control but she still does not have an appetite and is eating poorly. Drinking more fluids. Had a loose stool this AM. She cannot recall if she had more diarrhea yesterday afternoon. Ambulating in the hall without difficulty. Cough is improving- no dyspnea.   Assessment/Plan: Principal Problem:   Nausea and vomiting - symptoms occurring after CABG per patient -I have reviewed the new medications that she was discharged home with after her CABG and I'm doubtful that any of these will predispose her to nausea.-Many medications are on hold. -She states that symptoms have been improving and she is now able to eat and drink - continue Pepcid and PRN Zofran   Active Problems:   Orthostatic hypotension -She was sent home after her CABG with midodrine and I suspect that the diagnosis of orthostatic hypotension was made while she was admitted for the procedure- midodrine did not resolve her symptoms -As she was initially hypovolemic due to poor PO intake in relation to above  mentioned nausea, I hydrated her aggressively.  - However, despite being adequately hydrated she continued to be significantly orthostatic by vital signs and by symptoms -TSH normal on 4/29- 3.457 - Baseline cortisol was normal-cosyntropin stimulation test normal as well - have spoken with Dr Buddy Duty (endocrinology) who recommended obtaining aldosterone- renin level and free T4 - Free T4 normal - he also recommended a trial of Florinef which I started on 5/4 - orthostatic vitals noted to be improved today with only a 10 mm mercury drop in BP and 10 bpm rise in HR - aldosterone/renin ratio normal - orthostatic hypotension may be related to autonomic dysfunction in relation to diabetes -was becoming hypokalemic with Fludrocortisone and therefore transitioned to Pyridostigmine after discussing with Neurology- although orthostatic vitals still positive, she is now asymptomatic  Pleural effusion/ pneumonia - left sided effusion noted admission and on CT neck incidentally- she is not hypoxic or short of breath with exertion but mentioned symptoms of cough with green mucous on 5/6 - left mid lung airspace disease noted on CXR - will need to start coverage for pneumonia with Vanc and Cefepime  -effusion noted on CXR on 07/07/14 therefore likely secondary to CABG and not pneumonia- no need to tap effusion just yet- not hypoxia or dyspneic   Diarrhea - starting prior to starting antibiotics for pneumonia- f/u for c diff  Hypokalemia - possibly due to Fludrocortisone- replaced   Acute/ subacute multifocal CVA - obtained MRI on 5/4 to further evaluate symptoms of headache in relation to orthostasis/ possible pituitary mass - this revealed multifocal infarcts in corona radiata bilaterally- per neuro, they  likely occurred during her recent CABG - Patient is asymptomatic from above infarcts - ECHO already performed and found to be normal- see report below - CTA neck/head does not reveal any stenosis -  already on Plavix and Lipitor- aspirin 81 mg added by Neurology and discontinued later    Diabetes mellitus type 2, uncontrolled- is not on insulin at home -last A1c on 4/29 was 8.2 but previously her A1c's were uncontrolled at 11 -sugars are currently reasonably controlled in the hospital on low-dose Levemir and sliding scale insulin -Tradjenta and metformin on hold    UTI (lower urinary tract infection) -culture growing Klebsiella pneumonia which is pansensitive-she initially received ciprofloxacin and then Rocephin-  -  She has had a full 3 days of treatment    S/P CABG x 3, April 16 - continue aspirin Plavix Lipitor  NSVT - had an episode when admitted in 1/16 - currently in sinus rhythm  Vitiligo   Appt with PCP: requested Code Status: Full code Family Communication: discussed lab results, imaging results and plan with patient in detail Disposition Plan: Home with home health PT DVT prophylaxis: heparin Consultants: Neuro Procedures: 2D ECHO Left ventricle: The cavity size was normal. There was mild concentric hypertrophy. Systolic function was vigorous. The estimated ejection fraction was in the range of 65% to 70%. Doppler parameters are consistent with abnormal left ventricular relaxation (grade 1 diastolic dysfunction).  Antibiotics: Anti-infectives    Start     Dose/Rate Route Frequency Ordered Stop   07/16/14 1100  vancomycin (VANCOCIN) 500 mg in sodium chloride 0.9 % 100 mL IVPB     500 mg 100 mL/hr over 60 Minutes Intravenous Every 12 hours 07/16/14 0926     07/16/14 1000  ceFEPIme (MAXIPIME) 1 g in dextrose 5 % 50 mL IVPB     1 g 100 mL/hr over 30 Minutes Intravenous Every 24 hours 07/16/14 0926     07/15/14 1400  levofloxacin (LEVAQUIN) tablet 750 mg  Status:  Discontinued     750 mg Oral Every 24 hours 07/15/14 1252 07/16/14 0857   07/09/14 1430  cefTRIAXone (ROCEPHIN) 1 g in dextrose 5 % 50 mL IVPB - Premix  Status:  Discontinued    Comments:   Aware of PCN allergy   1 g 100 mL/hr over 30 Minutes Intravenous Every 24 hours 07/09/14 1333 07/11/14 1146   07/08/14 0200  ciprofloxacin (CIPRO) IVPB 400 mg  Status:  Discontinued     400 mg 200 mL/hr over 60 Minutes Intravenous Every 12 hours 07/08/14 0158 07/09/14 1333      Objective: Filed Weights   07/14/14 0507 07/15/14 0609 07/16/14 0517  Weight: 63.6 kg (140 lb 3.4 oz) 62.097 kg (136 lb 14.4 oz) 61.281 kg (135 lb 1.6 oz)    Intake/Output Summary (Last 24 hours) at 07/16/14 1119 Last data filed at 07/16/14 0938  Gross per 24 hour  Intake    343 ml  Output   2551 ml  Net  -2208 ml     Vitals Filed Vitals:   07/15/14 0609 07/15/14 1309 07/15/14 2011 07/16/14 0517  BP: 164/73 168/60 156/72 147/72  Pulse: 99 94 84 89  Temp: 98.1 F (36.7 C) 98.5 F (36.9 C) 98.7 F (37.1 C) 98.1 F (36.7 C)  TempSrc: Oral Oral Oral Oral  Resp: 18 18 20 18   Height:      Weight: 62.097 kg (136 lb 14.4 oz)   61.281 kg (135 lb 1.6 oz)  SpO2: 98% 94% 97% 96%  Exam:  General:  Pt is alert, not in acute distress  HEENT: No icterus, No thrush  Cardiovascular: regular rate and rhythm, S1/S2 No murmur  Respiratory: clear to auscultation bilaterally   Abdomen: Soft, +Bowel sounds, non tender, non distended, no guarding  MSK: No LE edema, cyanosis or clubbing  Data Reviewed: Basic Metabolic Panel:  Recent Labs Lab 07/11/14 0424 07/13/14 0915 07/14/14 0500 07/15/14 0320 07/15/14 1814  NA 139 140 140 139 139  K 3.5 3.7 2.9* 2.7* 4.3  CL 109 109 110 106 106  CO2 23 22 22 23 23   GLUCOSE 86 166* 117* 95 112*  BUN <5* <5* <5* <5* <5*  CREATININE 1.07* 0.95 0.84 0.74 0.94  CALCIUM 8.4* 8.6* 8.4* 8.6* 9.1   Liver Function Tests: No results for input(s): AST, ALT, ALKPHOS, BILITOT, PROT, ALBUMIN in the last 168 hours. No results for input(s): LIPASE, AMYLASE in the last 168 hours. No results for input(s): AMMONIA in the last 168 hours. CBC:  Recent Labs Lab  07/11/14 0424 07/15/14 0954  WBC 6.9 6.4  HGB 11.4* 10.8*  HCT 33.0* 31.8*  MCV 88.9 89.3  PLT 294 254   Cardiac Enzymes: No results for input(s): CKTOTAL, CKMB, CKMBINDEX, TROPONINI in the last 168 hours. BNP (last 3 results)  Recent Labs  07/07/14 2210  BNP 125.5*    ProBNP (last 3 results) No results for input(s): PROBNP in the last 8760 hours.  CBG:  Recent Labs Lab 07/15/14 0705 07/15/14 1103 07/15/14 1626 07/15/14 2133 07/16/14 0607  GLUCAP 109* 109* 106* 106* 91    Recent Results (from the past 240 hour(s))  Blood Culture (routine x 2)     Status: None   Collection Time: 07/07/14 10:10 PM  Result Value Ref Range Status   Specimen Description BLOOD LEFT ARM  Final   Special Requests BOTTLES DRAWN AEROBIC AND ANAEROBIC 6CC  Final   Culture   Final    NO GROWTH 5 DAYS Performed at [facility]    Report Status 07/14/2014 FINAL  Final  Blood Culture (routine x 2)     Status: None   Collection Time: 07/07/14 10:10 PM  Result Value Ref Range Status   Specimen Description BLOOD RIGHT ARM  Final   Special Requests BOTTLES DRAWN AEROBIC ONLY 10CC  Final   Culture   Final    NO GROWTH 5 DAYS Performed at [facility]    Report Status 07/14/2014 FINAL  Final  Urine culture     Status: None   Collection Time: 07/07/14 11:35 PM  Result Value Ref Range Status   Specimen Description URINE, RANDOM  Final   Special Requests NONE  Final   Colony Count   Final    >=100,000 COLONIES/ML Performed at [facility]    Culture   Final    KLEBSIELLA PNEUMONIAE Performed at [facility]    Report Status 07/10/2014 FINAL  Final   Organism ID, Bacteria KLEBSIELLA PNEUMONIAE  Final      Susceptibility   Klebsiella pneumoniae - MIC*    AMPICILLIN RESISTANT      CEFAZOLIN <=4 SENSITIVE Sensitive     CEFTRIAXONE <=1 SENSITIVE Sensitive     CIPROFLOXACIN <=0.25 SENSITIVE Sensitive     GENTAMICIN <=1 SENSITIVE Sensitive     LEVOFLOXACIN  <=0.12 SENSITIVE Sensitive     NITROFURANTOIN 32 SENSITIVE Sensitive     TOBRAMYCIN <=1 SENSITIVE Sensitive     TRIMETH/SULFA <=20 SENSITIVE Sensitive  PIP/TAZO <=4 SENSITIVE Sensitive     * KLEBSIELLA PNEUMONIAE     Studies: Ct Angio Head W/cm &/or Wo Cm  07/14/2014   CLINICAL DATA:  BILATERAL deep watershed infarcts of uncertain etiology. Patient presented with nausea, vomiting, dizziness, and generalized weakness. Recent CABG 06/25/2014.  EXAM: CT ANGIOGRAPHY HEAD AND NECK  TECHNIQUE: Multidetector CT imaging of the head and neck was performed using the standard protocol during bolus administration of intravenous contrast. Multiplanar CT image reconstructions and MIPs were obtained to evaluate the vascular anatomy. Carotid stenosis measurements (when applicable) are obtained utilizing NASCET criteria, using the distal internal carotid diameter as the denominator.  CONTRAST:  177mL OMNIPAQUE IOHEXOL 350 MG/ML SOLN  COMPARISON:  MR brain 07/13/2014.  FINDINGS: CT HEAD  Calvarium and skull base: No fracture or destructive lesion. Mastoids and middle ears are grossly clear.  Paranasal sinuses: Imaged portions are clear.  Orbits: Negative.  Brain: No evidence of acute abnormality, including acute infarct, hemorrhage, hydrocephalus, or mass lesion. Areas of hypoattenuation representing acute infarction are better appreciated on MR.  CTA NECK  Aortic arch: Bovine origin LEFT common carotid from the innominate Imaged portion shows no evidence of aneurysm or dissection. No significant stenosis of the major arch vessel origins.  Right carotid system: Mild non stenotic plaque at the bifurcation. No evidence of dissection, stenosis (50% or greater) or occlusion.  Left carotid system: Mild non stenotic plaque at the bifurcation. No evidence of dissection, stenosis (50% or greater) or occlusion.  Vertebral arteries: LEFT vertebral dominant. No evidence of dissection, stenosis (50% or greater) or occlusion.   Nonvascular soft tissues: Large LEFT pleural effusion. Previous median sternotomy for CABG. No neck masses.  CTA HEAD  Anterior circulation: Mild calcific change in the cavernous and supraclinoid internal carotid arteries without flow reducing lesion. Mild irregularity of the A1, and M1 segments of the anterior, and middle cerebral arteries without flow reducing lesion. No MCA branch occlusion. No significant stenosis, proximal occlusion, aneurysm, or vascular malformation.  Posterior circulation: No proximal P1 stenosis or cerebellar branch occlusion. No significant stenosis, proximal occlusion, aneurysm, or vascular malformation. Diminutive RIGHT vertebral distal to the PICA origin. LEFT vertebral dominant contributor.  Venous sinuses: As permitted by contrast timing, patent.  Anatomic variants:  None of significance.  Delayed phase:   No abnormal intracranial enhancement.  IMPRESSION: No extracranial or intracranial flow reducing lesion.  Mild irregularity of the carotid siphon, proximal ACA and MCA vessels without flow reducing lesion.  The areas of acute to subacute infarction are better demonstrated on MR.  Large LEFT pleural effusion status post recent CABG. CTS consultation may be warranted.   Electronically Signed   By: Rolla Flatten M.D.   On: 07/14/2014 17:35   Dg Chest 2 View  07/15/2014   CLINICAL DATA:  69 year old female with a history of pleural effusion  EXAM: CHEST - 2 VIEW  COMPARISON:  07/07/2014, 03/24/2014  FINDINGS: Cardiomediastinal silhouette unchanged. Atherosclerotic calcifications of the aortic arch. Surgical changes of prior median sternotomy.  Increasing size of left-sided pleural effusion with obscuration of the hemidiaphragm of the left heart border. Airspace disease developing within the left mid lung.  Trace effusion within the fissure on the lateral view.  Stigmata of emphysema, with increased retrosternal airspace, flattened hemidiaphragms, increased AP diameter, and  hyperinflation on the AP view.  Right lung relatively well aerated.  No displaced fracture.  Configuration of vertebral bodies unchanged.  Unremarkable appearance of the upper abdomen.  IMPRESSION: Increasing size of right-sided  pleural effusion and airspace disease, which potentially could be pneumonia and parapneumonic effusion. Correlation with patient presentation recommended.  Changes of emphysema.  Atherosclerosis.  Signed,  Dulcy Fanny. Earleen Newport, DO  [facility]  [facility]   Electronically Signed   By: Corrie Mckusick D.O.   On: 07/15/2014 09:27   Ct Angio Neck W/cm &/or Wo/cm  07/14/2014   CLINICAL DATA:  BILATERAL deep watershed infarcts of uncertain etiology. Patient presented with nausea, vomiting, dizziness, and generalized weakness. Recent CABG 06/25/2014.  EXAM: CT ANGIOGRAPHY HEAD AND NECK  TECHNIQUE: Multidetector CT imaging of the head and neck was performed using the standard protocol during bolus administration of intravenous contrast. Multiplanar CT image reconstructions and MIPs were obtained to evaluate the vascular anatomy. Carotid stenosis measurements (when applicable) are obtained utilizing NASCET criteria, using the distal internal carotid diameter as the denominator.  CONTRAST:  164mL OMNIPAQUE IOHEXOL 350 MG/ML SOLN  COMPARISON:  MR brain 07/13/2014.  FINDINGS: CT HEAD  Calvarium and skull base: No fracture or destructive lesion. Mastoids and middle ears are grossly clear.  Paranasal sinuses: Imaged portions are clear.  Orbits: Negative.  Brain: No evidence of acute abnormality, including acute infarct, hemorrhage, hydrocephalus, or mass lesion. Areas of hypoattenuation representing acute infarction are better appreciated on MR.  CTA NECK  Aortic arch: Bovine origin LEFT common carotid from the innominate Imaged portion shows no evidence of aneurysm or dissection. No significant stenosis of the major arch vessel origins.  Right carotid system:  Mild non stenotic plaque at the bifurcation. No evidence of dissection, stenosis (50% or greater) or occlusion.  Left carotid system: Mild non stenotic plaque at the bifurcation. No evidence of dissection, stenosis (50% or greater) or occlusion.  Vertebral arteries: LEFT vertebral dominant. No evidence of dissection, stenosis (50% or greater) or occlusion.  Nonvascular soft tissues: Large LEFT pleural effusion. Previous median sternotomy for CABG. No neck masses.  CTA HEAD  Anterior circulation: Mild calcific change in the cavernous and supraclinoid internal carotid arteries without flow reducing lesion. Mild irregularity of the A1, and M1 segments of the anterior, and middle cerebral arteries without flow reducing lesion. No MCA branch occlusion. No significant stenosis, proximal occlusion, aneurysm, or vascular malformation.  Posterior circulation: No proximal P1 stenosis or cerebellar branch occlusion. No significant stenosis, proximal occlusion, aneurysm, or vascular malformation. Diminutive RIGHT vertebral distal to the PICA origin. LEFT vertebral dominant contributor.  Venous sinuses: As permitted by contrast timing, patent.  Anatomic variants:  None of significance.  Delayed phase:   No abnormal intracranial enhancement.  IMPRESSION: No extracranial or intracranial flow reducing lesion.  Mild irregularity of the carotid siphon, proximal ACA and MCA vessels without flow reducing lesion.  The areas of acute to subacute infarction are better demonstrated on MR.  Large LEFT pleural effusion status post recent CABG. CTS consultation may be warranted.   Electronically Signed   By: Rolla Flatten M.D.   On: 07/14/2014 17:35    Scheduled Meds:  Scheduled Meds: .  stroke: mapping our early stages of recovery book   Does not apply Once  . atorvastatin  20 mg Oral QHS  . ceFEPime (MAXIPIME) IV  1 g Intravenous Q24H  . clopidogrel  75 mg Oral Daily  . famotidine  20 mg Oral QHS  . feeding supplement (ENSURE  ENLIVE)  237 mL Oral BID BM  . feeding supplement (GLUCERNA SHAKE)  237 mL Oral TID BM  . folic acid  1 mg Oral Daily  .  gabapentin  100 mg Oral TID  . heparin  5,000 Units Subcutaneous 3 times per day  . insulin aspart  0-15 Units Subcutaneous TID WC  . insulin aspart  0-5 Units Subcutaneous QHS  . insulin detemir  5 Units Subcutaneous QHS  . ondansetron  4 mg Oral QID   Or  . ondansetron (ZOFRAN) IV  4 mg Intravenous QID  . pyridostigmine  30 mg Oral TID AC  . sertraline  50 mg Oral Daily  . sodium chloride  3 mL Intravenous Q12H  . vancomycin  500 mg Intravenous Q12H   Continuous Infusions:    Time spent on care of this patient: 35 min   [place], MD 07/16/2014, 11:19 AM  LOS: 8 days   [facility] Office  [phone] Pager - Text Page per [email or web address]  If 7PM-7AM, please contact night-coverage [email or web address]

## 2014-07-17 LAB — URINALYSIS, ROUTINE W REFLEX MICROSCOPIC
Bilirubin Urine: NEGATIVE
Glucose, UA: NEGATIVE mg/dL
Hgb urine dipstick: NEGATIVE
Ketones, ur: 15 mg/dL — AB
Nitrite: NEGATIVE
Protein, ur: NEGATIVE mg/dL
SPECIFIC GRAVITY, URINE: 1.022 (ref 1.005–1.030)
Urobilinogen, UA: 0.2 mg/dL (ref 0.0–1.0)
pH: 5 (ref 5.0–8.0)

## 2014-07-17 LAB — GLUCOSE, CAPILLARY
GLUCOSE-CAPILLARY: 122 mg/dL — AB (ref 70–99)
Glucose-Capillary: 147 mg/dL — ABNORMAL HIGH (ref 70–99)
Glucose-Capillary: 118 mg/dL — ABNORMAL HIGH (ref 70–99)
Glucose-Capillary: 82 mg/dL (ref 70–99)

## 2014-07-17 LAB — URINE MICROSCOPIC-ADD ON

## 2014-07-17 MED ORDER — MIDODRINE HCL 5 MG PO TABS
10.0000 mg | ORAL_TABLET | Freq: Three times a day (TID) | ORAL | Status: DC
  Administered 2014-07-17 – 2014-07-18 (×3): 10 mg via ORAL
  Filled 2014-07-17 (×5): qty 2

## 2014-07-17 MED ORDER — METOCLOPRAMIDE HCL 10 MG PO TABS
10.0000 mg | ORAL_TABLET | Freq: Three times a day (TID) | ORAL | Status: DC
  Administered 2014-07-17 – 2014-07-18 (×4): 10 mg via ORAL
  Filled 2014-07-17 (×7): qty 1

## 2014-07-17 MED ORDER — DIPHENOXYLATE-ATROPINE 2.5-0.025 MG PO TABS: 1.0000 | ORAL_TABLET | Freq: Four times a day (QID) | ORAL | Status: DC | PRN

## 2014-07-17 MED ORDER — SODIUM CHLORIDE 0.9 % IV BOLUS (SEPSIS)
1000.0000 mL | Freq: Once | INTRAVENOUS | Status: AC
  Administered 2014-07-17: 1000 mL via INTRAVENOUS

## 2014-07-17 NOTE — Progress Notes (Addendum)
Pt encouraged to ambulate, but refused because she "felt too weak and nauseated". Pt given glucerna; only 1/3 of glucerna drank. Pt verbalized understanding of importance of walking and eating.   Pt ambulated to bathroom- pt felt dizzy and stumbled slightly. She stated "i feel drunk and really nauseous".   Will continue to monitor.   Earlie Lou

## 2014-07-17 NOTE — Progress Notes (Signed)
[facility] Progress Note   ASHARA Fox NKN:[id number] DOB: 11/28/1945 DOA: 07/07/2014 PCP: [name], MD  Brief narrative: Maria Fox is a 69 y.o. female with PMH of hypertension, diabetes mellitus, migraines who recently underwent CABG 3 after an NSTEMI on 4/7 in [place] where she was visiting at the time.she was discharged to a rehabilitation facility and then discharged to home this past Tuesday. She claims to be nauseated and lightheaded ever since her CABG and was admitted for complaints of nausea and dizziness on 4/28. She continues to have severe nausea to the point where she is barely eating. She was noted to be orthostatic and despite receiving fluids in the hospital she continues to have positive orthostatic vitals. Per H&P, the patient was previously on amiodarone, Coreg and lisinopril which were discontinued due to her complaints of dizziness.  Subjective: Patient again states that she is not able to eat much, feels weak when ambulating. No headache today. Not coughing much but not ready to go home. States she has burning when urinating today.   Assessment/Plan: Principal Problem:   Nausea and vomiting - symptoms occurring after CABG per patient -I have reviewed the new medications that she was discharged home with after her CABG and I'm doubtful that any of these will predispose her to nausea.-Many medications are on hold. -She states that symptoms have been improving and she is now able to eat and drink - continue Pepcid and PRN Zofran- - will be adding Reglan TIC AC and HS today in case nausea is related to diabetic gastroparesis   Active Problems:   Orthostatic hypotension -She was sent home after her CABG with midodrine and I suspect that the diagnosis of orthostatic hypotension was made while she was admitted for the procedure- midodrine did not resolve her symptoms -As she was initially hypovolemic due to poor PO intake in relation to above  mentioned nausea, I hydrated her aggressively.  - However, despite being adequately hydrated she continued to be significantly orthostatic by vital signs and by symptoms -TSH normal on 4/29- 3.457 - Baseline cortisol was normal-cosyntropin stimulation test normal as well - have spoken with Dr Buddy Duty (endocrinology) who recommended obtaining aldosterone- renin level and free T4 - Free T4 normal - he also recommended a trial of Florinef which I started on 5/4 - orthostatic vitals noted to be improved today with only a 10 mm mercury drop in BP and 10 bpm rise in HR - aldosterone/renin ratio normal - orthostatic hypotension may be related to autonomic dysfunction in relation to diabetes - she was becoming profoundly hypokalemic with Fludrocortisone and therefore transitioned to Pyridostigmine after discussing with Neurology- unfortunately, GI symptoms have been exacerbated after started Pyridostigmine which has known GI side effects- will transition back to Midodrine- I have little more to offer her at this point.   Pleural effusion/ pneumonia - left sided effusion noted admission and on CT neck incidentally- she is not hypoxic or short of breath with exertion but mentioned symptoms of cough with green mucous on 5/6 - left mid lung airspace disease noted on CXR - continue coverage for pneumonia with Vanc and Cefepime  -effusion noted on CXR on 07/07/14 therefore likely secondary to CABG and not pneumonia- no need to tap effusion just yet- not hypoxia or dyspneic   Diarrhea - starting prior to starting antibiotics for pneumonia- c diff negative- Lomotil PRN  Hypokalemia - possibly due to Fludrocortisone- replaced   Acute/ subacute multifocal CVA - obtained MRI on  5/4 to further evaluate symptoms of headache in relation to orthostasis/ possible pituitary mass - this revealed multifocal infarcts in corona radiata bilaterally- per neuro, they likely occurred during her recent CABG - Patient is  asymptomatic from above infarcts - ECHO already performed and found to be normal- see report below - CTA neck/head does not reveal any stenosis - already on Plavix and Lipitor- aspirin 81 mg added by Neurology and discontinued later    Diabetes mellitus type 2, uncontrolled- is not on insulin at home -last A1c on 4/29 was 8.2 but previously her A1c's were uncontrolled at 11 -sugars are currently reasonably controlled in the hospital on low-dose Levemir and sliding scale insulin -Tradjenta and metformin on hold    UTI (lower urinary tract infection) -culture growing Klebsiella pneumonia which is pansensitive-she initially received ciprofloxacin and then Rocephin-  -  She has had a full 3 days of treatment - c/o burning micturition today- UA negative     S/P CABG x 3, April 16 - continue aspirin Plavix Lipitor  NSVT - had an episode when admitted in 1/16 - currently in sinus rhythm  Vitiligo   Appt with PCP: requested Code Status: Full code Family Communication: discussed lab results, imaging results and plan with patient in detail Disposition Plan: Home with home health PT DVT prophylaxis: heparin Consultants: Neuro Procedures: 2D ECHO Left ventricle: The cavity size was normal. There was mild concentric hypertrophy. Systolic function was vigorous. The estimated ejection fraction was in the range of 65% to 70%. Doppler parameters are consistent with abnormal left ventricular relaxation (grade 1 diastolic dysfunction).  Antibiotics: Anti-infectives    Start     Dose/Rate Route Frequency Ordered Stop   07/16/14 1100  vancomycin (VANCOCIN) 500 mg in sodium chloride 0.9 % 100 mL IVPB     500 mg 100 mL/hr over 60 Minutes Intravenous Every 12 hours 07/16/14 0926     07/16/14 1000  ceFEPIme (MAXIPIME) 1 g in dextrose 5 % 50 mL IVPB     1 g 100 mL/hr over 30 Minutes Intravenous Every 24 hours 07/16/14 0926     07/15/14 1400  levofloxacin (LEVAQUIN) tablet 750 mg   Status:  Discontinued     750 mg Oral Every 24 hours 07/15/14 1252 07/16/14 0857   07/09/14 1430  cefTRIAXone (ROCEPHIN) 1 g in dextrose 5 % 50 mL IVPB - Premix  Status:  Discontinued    Comments:  Aware of PCN allergy   1 g 100 mL/hr over 30 Minutes Intravenous Every 24 hours 07/09/14 1333 07/11/14 1146   07/08/14 0200  ciprofloxacin (CIPRO) IVPB 400 mg  Status:  Discontinued     400 mg 200 mL/hr over 60 Minutes Intravenous Every 12 hours 07/08/14 0158 07/09/14 1333      Objective: Filed Weights   07/15/14 0609 07/16/14 0517 07/17/14 0520  Weight: 62.097 kg (136 lb 14.4 oz) 61.281 kg (135 lb 1.6 oz) 60.873 kg (134 lb 3.2 oz)    Intake/Output Summary (Last 24 hours) at 07/17/14 1333 Last data filed at 07/17/14 1311  Gross per 24 hour  Intake      3 ml  Output    750 ml  Net   -747 ml     Vitals Filed Vitals:   07/16/14 0517 07/16/14 1359 07/16/14 1956 07/17/14 0520  BP: 147/72 126/99 148/78 139/65  Pulse: 89 104 91 95  Temp: 98.1 F (36.7 C) 97.6 F (36.4 C) 97.8 F (36.6 C) 98 F (36.7 C)  TempSrc:  Oral Oral Oral Oral  Resp: 18  18 18   Height:      Weight: 61.281 kg (135 lb 1.6 oz)   60.873 kg (134 lb 3.2 oz)  SpO2: 96% 97% 95% 95%    Exam:  General:  Pt is alert, not in acute distress  HEENT: No icterus, No thrush  Cardiovascular: regular rate and rhythm, S1/S2 No murmur  Respiratory: clear to auscultation bilaterally   Abdomen: Soft, +Bowel sounds, non tender, non distended, no guarding  MSK: No LE edema, cyanosis or clubbing  Data Reviewed: Basic Metabolic Panel:  Recent Labs Lab 07/11/14 0424 07/13/14 0915 07/14/14 0500 07/15/14 0320 07/15/14 1814  NA 139 140 140 139 139  K 3.5 3.7 2.9* 2.7* 4.3  CL 109 109 110 106 106  CO2 23 22 22 23 23   GLUCOSE 86 166* 117* 95 112*  BUN <5* <5* <5* <5* <5*  CREATININE 1.07* 0.95 0.84 0.74 0.94  CALCIUM 8.4* 8.6* 8.4* 8.6* 9.1   Liver Function Tests: No results for input(s): AST, ALT, ALKPHOS,  BILITOT, PROT, ALBUMIN in the last 168 hours. No results for input(s): LIPASE, AMYLASE in the last 168 hours. No results for input(s): AMMONIA in the last 168 hours. CBC:  Recent Labs Lab 07/11/14 0424 07/15/14 0954  WBC 6.9 6.4  HGB 11.4* 10.8*  HCT 33.0* 31.8*  MCV 88.9 89.3  PLT 294 254   Cardiac Enzymes: No results for input(s): CKTOTAL, CKMB, CKMBINDEX, TROPONINI in the last 168 hours. BNP (last 3 results)  Recent Labs  07/07/14 2210  BNP 125.5*    ProBNP (last 3 results) No results for input(s): PROBNP in the last 8760 hours.  CBG:  Recent Labs Lab 07/16/14 1125 07/16/14 1610 07/16/14 2100 07/17/14 0630 07/17/14 1120  GLUCAP 132* 128* 82 147* 118*    Recent Results (from the past 240 hour(s))  Blood Culture (routine x 2)     Status: None   Collection Time: 07/07/14 10:10 PM  Result Value Ref Range Status   Specimen Description BLOOD LEFT ARM  Final   Special Requests BOTTLES DRAWN AEROBIC AND ANAEROBIC 6CC  Final   Culture   Final    NO GROWTH 5 DAYS Performed at [facility]    Report Status 07/14/2014 FINAL  Final  Blood Culture (routine x 2)     Status: None   Collection Time: 07/07/14 10:10 PM  Result Value Ref Range Status   Specimen Description BLOOD RIGHT ARM  Final   Special Requests BOTTLES DRAWN AEROBIC ONLY 10CC  Final   Culture   Final    NO GROWTH 5 DAYS Performed at [facility]    Report Status 07/14/2014 FINAL  Final  Urine culture     Status: None   Collection Time: 07/07/14 11:35 PM  Result Value Ref Range Status   Specimen Description URINE, RANDOM  Final   Special Requests NONE  Final   Colony Count   Final    >=100,000 COLONIES/ML Performed at [facility]    Culture   Final    KLEBSIELLA PNEUMONIAE Performed at [facility]    Report Status 07/10/2014 FINAL  Final   Organism ID, Bacteria KLEBSIELLA PNEUMONIAE  Final      Susceptibility   Klebsiella pneumoniae - MIC*    AMPICILLIN  RESISTANT      CEFAZOLIN <=4 SENSITIVE Sensitive     CEFTRIAXONE <=1 SENSITIVE Sensitive     CIPROFLOXACIN <=0.25 SENSITIVE Sensitive  GENTAMICIN <=1 SENSITIVE Sensitive     LEVOFLOXACIN <=0.12 SENSITIVE Sensitive     NITROFURANTOIN 32 SENSITIVE Sensitive     TOBRAMYCIN <=1 SENSITIVE Sensitive     TRIMETH/SULFA <=20 SENSITIVE Sensitive     PIP/TAZO <=4 SENSITIVE Sensitive     * KLEBSIELLA PNEUMONIAE  Clostridium Difficile by PCR     Status: None   Collection Time: 07/16/14  9:16 AM  Result Value Ref Range Status   C difficile by pcr NEGATIVE NEGATIVE Final     Studies: No results found.  Scheduled Meds:  Scheduled Meds: . atorvastatin  20 mg Oral QHS  . ceFEPime (MAXIPIME) IV  1 g Intravenous Q24H  . clopidogrel  75 mg Oral Daily  . famotidine  20 mg Oral QHS  . feeding supplement (ENSURE ENLIVE)  237 mL Oral BID BM  . feeding supplement (GLUCERNA SHAKE)  237 mL Oral TID BM  . folic acid  1 mg Oral Daily  . gabapentin  100 mg Oral TID  . heparin  5,000 Units Subcutaneous 3 times per day  . insulin aspart  0-15 Units Subcutaneous TID WC  . insulin aspart  0-5 Units Subcutaneous QHS  . insulin detemir  5 Units Subcutaneous QHS  . metoCLOPramide  10 mg Oral TID AC & HS  . midodrine  10 mg Oral TID WC  . ondansetron  4 mg Oral QID   Or  . ondansetron (ZOFRAN) IV  4 mg Intravenous QID  . sertraline  50 mg Oral Daily  . sodium chloride  3 mL Intravenous Q12H  . vancomycin  500 mg Intravenous Q12H   Continuous Infusions:    Time spent on care of this patient: 35 min   Bertha, MD 07/17/2014, 1:33 PM  LOS: 9 days   [facility] Office  [phone] Pager - Text Page per [email or web address]  If 7PM-7AM, please contact night-coverage [email or web address]

## 2014-07-18 LAB — GLUCOSE, CAPILLARY
GLUCOSE-CAPILLARY: 120 mg/dL — AB (ref 70–99)
Glucose-Capillary: 142 mg/dL — ABNORMAL HIGH (ref 70–99)

## 2014-07-18 MED ORDER — LEVOFLOXACIN 750 MG PO TABS: 750.0000 mg | ORAL_TABLET | Freq: Every day | ORAL | Status: DC

## 2014-07-18 MED ORDER — GLUCERNA SHAKE PO LIQD: 237.0000 mL | Freq: Three times a day (TID) | ORAL | Status: DC

## 2014-07-18 MED ORDER — MIDODRINE HCL 10 MG PO TABS: 10.0000 mg | ORAL_TABLET | Freq: Three times a day (TID) | ORAL | Status: DC

## 2014-07-18 MED ORDER — PANTOPRAZOLE SODIUM 40 MG PO TBEC: 40.0000 mg | DELAYED_RELEASE_TABLET | Freq: Every day | ORAL | Status: DC

## 2014-07-18 MED ORDER — DOXYCYCLINE HYCLATE 100 MG PO TABS: 100.0000 mg | ORAL_TABLET | Freq: Two times a day (BID) | ORAL | Status: DC

## 2014-07-18 MED ORDER — METOCLOPRAMIDE HCL 10 MG PO TABS: 10.0000 mg | ORAL_TABLET | Freq: Three times a day (TID) | ORAL | Status: DC

## 2014-07-18 NOTE — Progress Notes (Signed)
OT Cancellation Note  Patient Details Name: Maria Fox MRN: [id number] DOB: Dec 12, 1945   Cancelled Treatment:    Reason Eval/Treat Not Completed: Other (comment), pt. In bed with washcloth covering face.  Declines skilled OT due to continued nausea and states she does not want to move out of bed until she has met with m.d. To discuss how she feels.  Will check back as able.    Janice Coffin, COTA/L 07/18/2014, 7:59 AM

## 2014-07-18 NOTE — Progress Notes (Signed)
Medicare Important Message given? YES  (If response is "NO", the following Medicare IM given date fields will be blank)  Date Medicare IM given: 07/18/14 Medicare IM given by:  Shajuan Musso  

## 2014-07-18 NOTE — Discharge Summary (Addendum)
Physician Discharge Summary  Maria Fox:[id number] DOB: 09-23-1945 DOA: 07/07/2014  PCP: [name], MD  Admit date: 07/07/2014 Discharge date: 07/18/2014  Time spent:50 minutes  Recommendations for Outpatient Follow-up:  1. outpt GI follow up if Nausea continues to be an issue   Discharge Condition: stable Diet recommendation: diabetic, low sodium, heart healthy  Discharge Diagnoses:  Principal Problem:   Nausea without vomiting Active Problems:   Diabetes mellitus type 2, uncontrolled   UTI (lower urinary tract infection)   S/P CABG x 3, April 16   CVA (cerebral infarction), post CABG   Autonomic orthostatic hypotension   HCAP (healthcare-associated pneumonia)   History of present illness:  Maria Fox is a 69 y.o. female with PMH of hypertension, diabetes mellitus, migraines who recently underwent CABG 3 after an NSTEMI on 4/7 in [place] where she was visiting at the time.she was discharged to a rehabilitation facility and then discharged to home this past Tuesday. She claims to be nauseated and lightheaded ever since her CABG and was admitted for complaints of nausea and dizziness on 4/28. She continues to have severe nausea to the point where she is barely eating. She was noted to be orthostatic and despite receiving fluids in the hospital she continues to have positive orthostatic vitals. Per H&P, the patient was previously on amiodarone, Coreg and lisinopril which were discontinued due to her complaints of dizziness.   Hospital Course:  Principal Problem:  Nausea and vomiting - likely gastroparesis - symptoms occurring after CABG per patient -I have reviewed the new medications that she was discharged home with after her CABG and I'm doubtful that any of these will predispose her to nausea.-Many medications are on hold. -She states that symptoms have been improving and she is now able to eat and drink - have added Reglan TIC AC and HS with the suspicion  that nausea is related to diabetic gastroparesis- she will be discharged on this and daily Protonix and PRN Zofran-she knows to follow up with with her PCP if nausea does not resolve would recommend a GI consult for possible Endoscopic eval   Active Problems:  Orthostatic hypotension -She was sent home after her CABG with midodrine and I suspect that the diagnosis of orthostatic hypotension was made while she was admitted for the procedure- midodrine did not resolve her symptoms -As she was initially hypovolemic due to poor PO intake in relation to above mentioned nausea, I hydrated her aggressively. - However, despite being adequately hydrated she continued to be significantly orthostatic by vital signs and by symptoms -TSH normal on 4/29- 3.457 - Baseline cortisol was normal-cosyntropin stimulation test normal as well - have spoken with Dr Buddy Duty (endocrinology) who recommended obtaining aldosterone- renin level and free T4 - Free T4 normal - he also recommended a trial of Florinef which I started on 5/4 - aldosterone/renin ratio normal - At this point I feel that orthostatic hypotension may be related to autonomic dysfunction in relation to diabetes - she was becoming profoundly hypokalemic with Fludrocortisone and therefore transitioned to Pyridostigmine after discussing with Neurology- unfortunately, GI symptoms have been exacerbated after started Pyridostigmine which has known GI side effects- Therefore,  transitioned back to Midodrine- she is currently asymptomatic as far as orthostasis symptoms although orthostatic vital remain positive- she has been able to ambulate in the hall daily without dizziness - I have discontinued Meclizine that she was taking as outpt as her symptoms are not related to Vertigo  Acute/ subacute multifocal CVA -  obtained MRI on 5/4 to further evaluate symptoms of headache in relation to orthostasis/ possible pituitary mass - this revealed multifocal infarcts in  corona radiata bilaterally- per neuro, they likely occurred during her recent CABG - Patient is asymptomatic from above infarcts - ECHO already performed and found to be normal- see report below - CTA neck/head does not reveal any stenosis - already on Plavix, Aspirin and Lipitor at home   Pleural effusion - left sided effusion noted admission and on CT neck incidentally- she is not hypoxic or short of breath with exertion -effusion noted on CXR on 07/07/14 therefore likely secondary to CABG and not pneumonia- no need to tap effusion just yet- not hypoxia or dyspneic   HCAP - she mentioned symptoms of cough with green mucous on 5/6 and a repeat CXR reveal a left sided infiltrate (see report below) - she was started on coverage for HCAP with Vanc and Cefepime and has been transitioned to Doxycycline and Levaquin on d/c today- her cough has completely resolved - no dyspnea or hypoxia noted   Hypokalemia - Likely due to Fludrocortisone use - replaced    Diabetes mellitus type 2, uncontrolled- is not on insulin at home -last A1c on 4/29 was 8.2 but previously her A1c's were uncontrolled at 11 -sugars were currently reasonably controlled in the hospital on low-dose Levemir and sliding scale insulin -Tradjenta and metformin resume on discharge- discussed in detail with patient and significant other that nausea and orthostatic hypotension are likely related to long term uncontrolled DM and have advised her to work with her PCP to better control her diabetes   UTI (lower urinary tract infection) -culture growing Klebsiella pneumonia which is pansensitive-she initially received ciprofloxacin and then Rocephin-  - She has had a full 3 days of treatment - c/o burning micturition on 5/8 - UA negative    S/P CABG x 3, April 16 - continue aspirin Plavix Lipitor  NSVT - had an episode when admitted in 1/16 and has since been in sinus rhythm  Vitiligo   Procedures: 2D ECHO Left ventricle:  The cavity size was normal. There was mild concentric hypertrophy. Systolic function was vigorous. The estimated ejection fraction was in the range of 65% to 70%. Doppler parameters are consistent with abnormal left ventricular relaxation (grade 1 diastolic dysfunction).  Consultations:  Neuro   Discharge Exam: Filed Weights   07/15/14 4174 07/16/14 0517 07/17/14 0520  Weight: 62.097 kg (136 lb 14.4 oz) 61.281 kg (135 lb 1.6 oz) 60.873 kg (134 lb 3.2 oz)   Filed Vitals:   07/18/14 0821  BP: 158/87  Pulse: 84  Temp:   Resp: 20    General: AAO x 3, no distress Cardiovascular: RRR, no murmurs  Respiratory: clear to auscultation bilaterally GI: soft, non-tender, non-distended, bowel sound positive  Discharge Instructions You were cared for by a hospitalist during your hospital stay. If you have any questions about your discharge medications or the care you received while you were in the hospital after you are discharged, you can call the unit and asked to speak with the hospitalist on call if the hospitalist that took care of you is not available. Once you are discharged, your primary care physician will handle any further medical issues. Please note that NO REFILLS for any discharge medications will be authorized once you are discharged, as it is imperative that you return to your primary care physician (or establish a relationship with a primary care physician if you do not have  one) for your aftercare needs so that they can reassess your need for medications and monitor your lab values.      Discharge Instructions    Discharge instructions    Complete by:  As directed   Carb modified, heart healthy diet.     Increase activity slowly    Complete by:  As directed             Medication List    STOP taking these medications        HYDROcodone-acetaminophen 5-325 MG per tablet  Commonly known as:  NORCO/VICODIN     hydrOXYzine 10 MG tablet  Commonly known as:   ATARAX/VISTARIL     meclizine 25 MG tablet  Commonly known as:  ANTIVERT     menthol-cetylpyridinium 3 MG lozenge  Commonly known as:  CEPACOL     traMADol 50 MG tablet  Commonly known as:  ULTRAM      TAKE these medications        acetaminophen 325 MG tablet  Commonly known as:  TYLENOL  Take 650 mg by mouth every 4 (four) hours as needed (Temp greater than than 101F).     aspirin EC 81 MG tablet  Take 81 mg by mouth daily. Take for 30 days then increase to 325mg  daily     atorvastatin 20 MG tablet  Commonly known as:  LIPITOR  Take 20 mg by mouth at bedtime.     clopidogrel 75 MG tablet  Commonly known as:  PLAVIX  Take 75 mg by mouth daily.     doxycycline 100 MG tablet  Commonly known as:  VIBRA-TABS  Take 1 tablet (100 mg total) by mouth 2 (two) times daily.  Start taking on:  07/19/2014     EPIPEN 0.3 mg/0.3 mL Soaj injection  Generic drug:  EPINEPHrine  Inject 0.3 mg into the muscle once.     famotidine 20 MG tablet  Commonly known as:  PEPCID  Take 20 mg by mouth at bedtime.     feeding supplement (GLUCERNA SHAKE) Liqd  Take 237 mLs by mouth 3 (three) times daily between meals.     ferrous sulfate 325 (65 FE) MG tablet  Take 325 mg by mouth daily.     folic acid 1 MG tablet  Commonly known as:  FOLVITE  Take 1 mg by mouth daily.     gabapentin 100 MG capsule  Commonly known as:  NEURONTIN  Take 1 capsule (100 mg total) by mouth 3 (three) times daily.     hydrocortisone cream 1 %  Apply 1 application topically 2 (two) times daily.     levofloxacin 750 MG tablet  Commonly known as:  LEVAQUIN  Take 1 tablet (750 mg total) by mouth daily.  Start taking on:  07/19/2014     linagliptin 5 MG Tabs tablet  Commonly known as:  TRADJENTA  Take 5 mg by mouth daily.     metFORMIN 500 MG tablet  Commonly known as:  GLUCOPHAGE  Take 1 tablet (500 mg total) by mouth 2 (two) times daily with a meal.     metoCLOPramide 10 MG tablet  Commonly known as:   REGLAN  Take 1 tablet (10 mg total) by mouth 4 (four) times daily -  before meals and at bedtime.     midodrine 10 MG tablet  Commonly known as:  PROAMATINE  Take 1 tablet (10 mg total) by mouth 3 (three) times daily with meals.  ondansetron 4 MG disintegrating tablet  Commonly known as:  ZOFRAN-ODT  Take 4 mg by mouth every 6 (six) hours.     pantoprazole 40 MG tablet  Commonly known as:  PROTONIX  Take 1 tablet (40 mg total) by mouth daily. Switch for any other PPI at similar dose and frequency     senna 8.6 MG Tabs tablet  Commonly known as:  SENOKOT  Take 2 tablets by mouth at bedtime as needed for mild constipation (constipation).     sertraline 50 MG tablet  Commonly known as:  ZOLOFT  Take 50 mg by mouth daily.     Vitamin D (Ergocalciferol) 50000 UNITS Caps capsule  Commonly known as:  DRISDOL  Take 50,000 Units by mouth every 7 (seven) days. No directions listed on how to take medication    Just has #10 as amount       Allergies  Allergen Reactions  . Codeine Nausea And Vomiting  . Penicillins Hives and Swelling  . Sulfa Antibiotics Nausea And Vomiting   Follow-up Information    Follow up with [name], MD In 1 week.   Specialty:  Family Medicine   Contact information:   [place] [address] [phone]       Call [facility] [address] .   Why:  Please call to schedule evaluation if not contacted   Contact information:   [facility]  [place] [address] [phone]      Follow up In 1 week.       The results of significant diagnostics from this hospitalization (including imaging, microbiology, ancillary and laboratory) are listed below for reference.    Significant Diagnostic Studies: Ct Angio Head W/cm &/or Wo Cm  07/14/2014   CLINICAL DATA:  BILATERAL deep watershed infarcts of uncertain etiology. Patient presented  with nausea, vomiting, dizziness, and generalized weakness. Recent CABG 06/25/2014.  EXAM: CT ANGIOGRAPHY HEAD AND NECK  TECHNIQUE: Multidetector CT imaging of the head and neck was performed using the standard protocol during bolus administration of intravenous contrast. Multiplanar CT image reconstructions and MIPs were obtained to evaluate the vascular anatomy. Carotid stenosis measurements (when applicable) are obtained utilizing NASCET criteria, using the distal internal carotid diameter as the denominator.  CONTRAST:  187mL OMNIPAQUE IOHEXOL 350 MG/ML SOLN  COMPARISON:  MR brain 07/13/2014.  FINDINGS: CT HEAD  Calvarium and skull base: No fracture or destructive lesion. Mastoids and middle ears are grossly clear.  Paranasal sinuses: Imaged portions are clear.  Orbits: Negative.  Brain: No evidence of acute abnormality, including acute infarct, hemorrhage, hydrocephalus, or mass lesion. Areas of hypoattenuation representing acute infarction are better appreciated on MR.  CTA NECK  Aortic arch: Bovine origin LEFT common carotid from the innominate Imaged portion shows no evidence of aneurysm or dissection. No significant stenosis of the major arch vessel origins.  Right carotid system: Mild non stenotic plaque at the bifurcation. No evidence of dissection, stenosis (50% or greater) or occlusion.  Left carotid system: Mild non stenotic plaque at the bifurcation. No evidence of dissection, stenosis (50% or greater) or occlusion.  Vertebral arteries: LEFT vertebral dominant. No evidence of dissection, stenosis (50% or greater) or occlusion.  Nonvascular soft tissues: Large LEFT pleural effusion. Previous median sternotomy for CABG. No neck masses.  CTA HEAD  Anterior circulation: Mild calcific change in the cavernous and supraclinoid internal carotid arteries without flow reducing lesion. Mild  irregularity of the A1, and M1 segments of the anterior, and middle cerebral arteries without flow reducing lesion. No MCA  branch occlusion. No significant stenosis, proximal occlusion, aneurysm, or vascular malformation.  Posterior circulation: No proximal P1 stenosis or cerebellar branch occlusion. No significant stenosis, proximal occlusion, aneurysm, or vascular malformation. Diminutive RIGHT vertebral distal to the PICA origin. LEFT vertebral dominant contributor.  Venous sinuses: As permitted by contrast timing, patent.  Anatomic variants:  None of significance.  Delayed phase:   No abnormal intracranial enhancement.  IMPRESSION: No extracranial or intracranial flow reducing lesion.  Mild irregularity of the carotid siphon, proximal ACA and MCA vessels without flow reducing lesion.  The areas of acute to subacute infarction are better demonstrated on MR.  Large LEFT pleural effusion status post recent CABG. CTS consultation may be warranted.   Electronically Signed   By: Rolla Flatten M.D.   On: 07/14/2014 17:35   Dg Chest 2 View  07/15/2014   CLINICAL DATA:  69 year old female with a history of pleural effusion  EXAM: CHEST - 2 VIEW  COMPARISON:  07/07/2014, 03/24/2014  FINDINGS: Cardiomediastinal silhouette unchanged. Atherosclerotic calcifications of the aortic arch. Surgical changes of prior median sternotomy.  Increasing size of left-sided pleural effusion with obscuration of the hemidiaphragm of the left heart border. Airspace disease developing within the left mid lung.  Trace effusion within the fissure on the lateral view.  Stigmata of emphysema, with increased retrosternal airspace, flattened hemidiaphragms, increased AP diameter, and hyperinflation on the AP view.  Right lung relatively well aerated.  No displaced fracture.  Configuration of vertebral bodies unchanged.  Unremarkable appearance of the upper abdomen.  IMPRESSION: Increasing size of right-sided pleural effusion and airspace disease, which potentially could be pneumonia and parapneumonic effusion. Correlation with patient presentation recommended.  Changes  of emphysema.  Atherosclerosis.  Signed,  Dulcy Fanny. Earleen Newport, DO  [facility]  [facility]   Electronically Signed   By: Corrie Mckusick D.O.   On: 07/15/2014 09:27   Ct Head Wo Contrast  07/08/2014   CLINICAL DATA:  Dizzy, nausea lightheadedness.  Generalize weakness.  EXAM: CT HEAD WITHOUT CONTRAST  TECHNIQUE: Contiguous axial images were obtained from the base of the skull through the vertex without intravenous contrast.  COMPARISON:  03/27/2014  FINDINGS: There is prominence of the sulci and ventricles consistent with brain atrophy. No acute cortical infarct, hemorrhage, or mass lesion ispresent. No significant extra-axial fluid collection is present. Partial opacification of the right maxillary sinus noted. The remaining paranasal sinuses and mastoid air cells are clear. The skull is intact.  IMPRESSION: 1. No acute intracranial abnormalities. 2. Mild brain atrophy.   Electronically Signed   By: Kerby Moors M.D.   On: 07/08/2014 15:54   Ct Angio Neck W/cm &/or Wo/cm  07/14/2014   CLINICAL DATA:  BILATERAL deep watershed infarcts of uncertain etiology. Patient presented with nausea, vomiting, dizziness, and generalized weakness. Recent CABG 06/25/2014.  EXAM: CT ANGIOGRAPHY HEAD AND NECK  TECHNIQUE: Multidetector CT imaging of the head and neck was performed using the standard protocol during bolus administration of intravenous contrast. Multiplanar CT image reconstructions and MIPs were obtained to evaluate the vascular anatomy. Carotid stenosis measurements (when applicable) are obtained utilizing NASCET criteria, using the distal internal carotid diameter as the denominator.  CONTRAST:  175mL OMNIPAQUE IOHEXOL 350 MG/ML SOLN  COMPARISON:  MR brain 07/13/2014.  FINDINGS: CT HEAD  Calvarium and skull base: No fracture or destructive lesion. Mastoids and middle  ears are grossly clear.  Paranasal sinuses: Imaged portions are clear.  Orbits: Negative.  Brain: No  evidence of acute abnormality, including acute infarct, hemorrhage, hydrocephalus, or mass lesion. Areas of hypoattenuation representing acute infarction are better appreciated on MR.  CTA NECK  Aortic arch: Bovine origin LEFT common carotid from the innominate Imaged portion shows no evidence of aneurysm or dissection. No significant stenosis of the major arch vessel origins.  Right carotid system: Mild non stenotic plaque at the bifurcation. No evidence of dissection, stenosis (50% or greater) or occlusion.  Left carotid system: Mild non stenotic plaque at the bifurcation. No evidence of dissection, stenosis (50% or greater) or occlusion.  Vertebral arteries: LEFT vertebral dominant. No evidence of dissection, stenosis (50% or greater) or occlusion.  Nonvascular soft tissues: Large LEFT pleural effusion. Previous median sternotomy for CABG. No neck masses.  CTA HEAD  Anterior circulation: Mild calcific change in the cavernous and supraclinoid internal carotid arteries without flow reducing lesion. Mild irregularity of the A1, and M1 segments of the anterior, and middle cerebral arteries without flow reducing lesion. No MCA branch occlusion. No significant stenosis, proximal occlusion, aneurysm, or vascular malformation.  Posterior circulation: No proximal P1 stenosis or cerebellar branch occlusion. No significant stenosis, proximal occlusion, aneurysm, or vascular malformation. Diminutive RIGHT vertebral distal to the PICA origin. LEFT vertebral dominant contributor.  Venous sinuses: As permitted by contrast timing, patent.  Anatomic variants:  None of significance.  Delayed phase:   No abnormal intracranial enhancement.  IMPRESSION: No extracranial or intracranial flow reducing lesion.  Mild irregularity of the carotid siphon, proximal ACA and MCA vessels without flow reducing lesion.  The areas of acute to subacute infarction are better demonstrated on MR.  Large LEFT pleural effusion status post recent CABG.  CTS consultation may be warranted.   Electronically Signed   By: Rolla Flatten M.D.   On: 07/14/2014 17:35   Mr Jeri Cos HW Contrast  07/13/2014   CLINICAL DATA:  Dizziness. Evaluate for pituitary mass. Recent presentation to emergency department with hypotension.  EXAM: MRI HEAD WITHOUT AND WITH CONTRAST  TECHNIQUE: Multiplanar, multiecho pulse sequences of the brain and surrounding structures were obtained without and with intravenous contrast.  CONTRAST:  25mL MULTIHANCE GADOBENATE DIMEGLUMINE 529 MG/ML IV SOLN  COMPARISON:  Head CT 07/08/2014  FINDINGS: Calvarium and upper cervical spine: No marrow signal abnormality.  Orbits: Bilateral cataract resection.  Sinuses: Clear. Probable mild mucosal thickening in the bilateral mastoid tips.  Brain: Small (1cm or smaller) acute infarcts in the bilateral corona radiata, symmetric in pattern although more extensive on the left. The left corona radiata signal abnormality is enhancing, suggesting subacute timing. No masslike enhancement is suspected.  The pituitary gland has normal size and enhancement for age. Normal chiasm and cavernous sinuses.  When accounting for FLAIR hyperintensity at the sites of acute infarction, there is no white matter disease for age. Mild generalized cerebral volume loss, mildly advanced for age.  No hydrocephalus.  No evidence of major vessel occlusion.  IMPRESSION: 1. Small acute to subacute infarcts in the bilateral corona radiata. Distribution and history of recurrent hypotension suggests deep watershed injury. 2. Normal pituitary gland.   Electronically Signed   By: Monte Fantasia M.D.   On: 07/13/2014 16:36   Dg Chest Port 1 View  07/07/2014   CLINICAL DATA:  69 year old female with dizziness, fatigue and dysphagia following open heart surgery 3 weeks previously.  EXAM: PORTABLE CHEST - 1 VIEW  COMPARISON:  Prior chest  x-ray 03/24/2014  FINDINGS: Cardiac and mediastinal contours are within normal limits. Patient is status post median  sternotomy with evidence of prior LIMA bypass. Nonspecific patchy opacity at the left lung base. Blunting of the costophrenic angle suggests a layering pleural effusion. No pulmonary edema. The right lung is clear. No pneumothorax. No acute osseous abnormality.  IMPRESSION: 1. Layering left pleural effusion and associated basilar atelectasis versus infiltrate. 2. No evidence of pneumothorax or pulmonary edema.   Electronically Signed   By: Jacqulynn Cadet M.D.   On: 07/07/2014 22:28   Dg Abd 2 Views  07/08/2014   CLINICAL DATA:  Nausea without vomiting.  Constipation.  EXAM: ABDOMEN - 2 VIEW  COMPARISON:  12/05/2010  FINDINGS: Moderate stool volume. No evidence of bowel obstruction. No concerning intra-abdominal mass effect or calcification. No pneumoperitoneum.  Blunting of the lateral left costophrenic sulcus which is evaluated on dedicated chest x-ray from yesterday. There is an associated streaky lung opacity.  IMPRESSION: 1. Negative abdomen. 2. Small left pleural effusion with atelectasis or bronchopneumonia, stable from yesterday.   Electronically Signed   By: Monte Fantasia M.D.   On: 07/08/2014 09:27    Microbiology: Recent Results (from the past 240 hour(s))  Clostridium Difficile by PCR     Status: None   Collection Time: 07/16/14  9:16 AM  Result Value Ref Range Status   C difficile by pcr NEGATIVE NEGATIVE Final     Labs: Basic Metabolic Panel:  Recent Labs Lab 07/13/14 0915 07/14/14 0500 07/15/14 0320 07/15/14 1814  NA 140 140 139 139  K 3.7 2.9* 2.7* 4.3  CL 109 110 106 106  CO2 22 22 23 23   GLUCOSE 166* 117* 95 112*  BUN <5* <5* <5* <5*  CREATININE 0.95 0.84 0.74 0.94  CALCIUM 8.6* 8.4* 8.6* 9.1   Liver Function Tests: No results for input(s): AST, ALT, ALKPHOS, BILITOT, PROT, ALBUMIN in the last 168 hours. No results for input(s): LIPASE, AMYLASE in the last 168 hours. No results for input(s): AMMONIA in the last 168 hours. CBC:  Recent Labs Lab  07/15/14 0954  WBC 6.4  HGB 10.8*  HCT 31.8*  MCV 89.3  PLT 254   Cardiac Enzymes: No results for input(s): CKTOTAL, CKMB, CKMBINDEX, TROPONINI in the last 168 hours. BNP: BNP (last 3 results)  Recent Labs  07/07/14 2210  BNP 125.5*    ProBNP (last 3 results) No results for input(s): PROBNP in the last 8760 hours.  CBG:  Recent Labs Lab 07/17/14 0630 07/17/14 1120 07/17/14 1615 07/17/14 2055 07/18/14 0538  GLUCAP 147* 118* 122* 82 142*       SignedDebbe Odea, MD [facility] 07/18/2014, 11:24 AM

## 2014-07-19 ENCOUNTER — Telehealth: Payer: Self-pay | Admitting: Family Medicine

## 2014-07-19 LAB — GI PATHOGEN PANEL BY PCR, STOOL
C difficile toxin A/B: NOT DETECTED
CRYPTOSPORIDIUM BY PCR: NOT DETECTED
Campylobacter by PCR: NOT DETECTED
E COLI 0157 BY PCR: NOT DETECTED
E coli (ETEC) LT/ST: NOT DETECTED
E coli (STEC): NOT DETECTED
G LAMBLIA BY PCR: NOT DETECTED
Norovirus GI/GII: NOT DETECTED
ROTAVIRUS A BY PCR: NOT DETECTED
SALMONELLA BY PCR: NOT DETECTED
Shigella by PCR: NOT DETECTED

## 2014-07-19 NOTE — Telephone Encounter (Signed)
plz call for hosp f/u phone call and schedule OV over next week - admitted with pneumonia, UTI, and nausea/dizziness after recent CABG at [facility]

## 2014-07-19 NOTE — Telephone Encounter (Signed)
Patient has follow up scheduled. Unable to reach patient via telephone. Home # is incorrect and cell # does not accept incoming calls. Note put in appt notes to update contact info.

## 2014-07-21 ENCOUNTER — Encounter: Payer: Self-pay | Admitting: Family Medicine

## 2014-07-21 ENCOUNTER — Ambulatory Visit (INDEPENDENT_AMBULATORY_CARE_PROVIDER_SITE_OTHER): Payer: Medicare Other | Admitting: Family Medicine

## 2014-07-21 VITALS — BP 130/80 | HR 120 | Temp 97.5°F | Wt 131.2 lb

## 2014-07-21 DIAGNOSIS — I638 Other cerebral infarction: Secondary | ICD-10-CM

## 2014-07-21 DIAGNOSIS — E1165 Type 2 diabetes mellitus with hyperglycemia: Secondary | ICD-10-CM

## 2014-07-21 DIAGNOSIS — I639 Cerebral infarction, unspecified: Secondary | ICD-10-CM

## 2014-07-21 DIAGNOSIS — I10 Essential (primary) hypertension: Secondary | ICD-10-CM

## 2014-07-21 DIAGNOSIS — I6389 Other cerebral infarction: Secondary | ICD-10-CM

## 2014-07-21 DIAGNOSIS — Z951 Presence of aortocoronary bypass graft: Secondary | ICD-10-CM

## 2014-07-21 DIAGNOSIS — T783XXD Angioneurotic edema, subsequent encounter: Secondary | ICD-10-CM

## 2014-07-21 DIAGNOSIS — E785 Hyperlipidemia, unspecified: Secondary | ICD-10-CM

## 2014-07-21 DIAGNOSIS — E1141 Type 2 diabetes mellitus with diabetic mononeuropathy: Secondary | ICD-10-CM

## 2014-07-21 DIAGNOSIS — I2510 Atherosclerotic heart disease of native coronary artery without angina pectoris: Secondary | ICD-10-CM

## 2014-07-21 DIAGNOSIS — R110 Nausea: Secondary | ICD-10-CM

## 2014-07-21 DIAGNOSIS — IMO0002 Reserved for concepts with insufficient information to code with codable children: Secondary | ICD-10-CM

## 2014-07-21 DIAGNOSIS — I951 Orthostatic hypotension: Secondary | ICD-10-CM

## 2014-07-21 DIAGNOSIS — E46 Unspecified protein-calorie malnutrition: Secondary | ICD-10-CM

## 2014-07-21 DIAGNOSIS — E1149 Type 2 diabetes mellitus with other diabetic neurological complication: Secondary | ICD-10-CM

## 2014-07-21 DIAGNOSIS — F331 Major depressive disorder, recurrent, moderate: Secondary | ICD-10-CM | POA: Diagnosis not present

## 2014-07-21 MED ORDER — MIDODRINE HCL 10 MG PO TABS: 10.0000 mg | ORAL_TABLET | Freq: Three times a day (TID) | ORAL | Status: DC

## 2014-07-21 MED ORDER — EPINEPHRINE 0.3 MG/0.3ML IJ SOAJ: 0.3000 mg | Freq: Once | INTRAMUSCULAR | Status: DC

## 2014-07-21 NOTE — Assessment & Plan Note (Signed)
Thought diabetic gastroparesis related - continue reglan 10mg  QID AC HS. Discussed monitoring for any tardive dyskinesia.

## 2014-07-21 NOTE — Assessment & Plan Note (Addendum)
Idiopathic angioedema to unclear trigger - epi pen refilled.

## 2014-07-21 NOTE — Assessment & Plan Note (Signed)
Discussed continued plavix and aspirin 81mg  daily - then after 30d increase to 325mg  daily.

## 2014-07-21 NOTE — Assessment & Plan Note (Addendum)
Discussed common for depression to set in after major cardiovascular event. Pt has been on zoloft 50mg  for about 1 week. Encouraged give more time for effect. Reassess next visit.

## 2014-07-21 NOTE — Assessment & Plan Note (Addendum)
Thought diabetes related. Refilled midodrine. Will refer to cardiology for this and for CAD s/p CABG last month.

## 2014-07-21 NOTE — Patient Instructions (Addendum)
Continue glucerna daily.  We will refer you to a local heart doctor in .  I've refilled midodrine for you to take three times daily with food. Return to see me in 4-6 weeks for follow up. Good to see you today, call us with questions. Good job with all these new medicines.

## 2014-07-21 NOTE — Progress Notes (Signed)
Pre visit review using our clinic review tool, if applicable. No additional management support is needed unless otherwise documented below in the visit note. 

## 2014-07-21 NOTE — Assessment & Plan Note (Signed)
Continue lipitor 20mg daily  

## 2014-07-21 NOTE — Assessment & Plan Note (Signed)
Will refer to local cardiologist.

## 2014-07-21 NOTE — Assessment & Plan Note (Addendum)
Endorses great control based on fasting labs recently. Continue tradjenta and metformin. Lab Results  Component Value Date   HGBA1C 8.2* 07/08/2014

## 2014-07-21 NOTE — Assessment & Plan Note (Signed)
This has resolved - now more orthostatic hypotension

## 2014-07-21 NOTE — Progress Notes (Addendum)
BP 130/80 mmHg  Pulse 120  Temp(Src) 97.5 F (36.4 C) (Oral)  Wt 131 lb 4 oz (59.535 kg)   CC: hospital f/u visit  Subjective:    Patient ID: Maria Fox, female    DOB: [birth year], 69 y.o.   MRN: [id number]  HPI: Maria Fox is a 69 y.o. female presenting on 07/21/2014 for Follow-up   Presents with friend today. Has cardiologist in [place], requests to establish locally.   Not seen here since 04/2012. Recent hospitalization at [facility] for nausea/vomiting/dizziness after recent 3v CABG at [facility]. Nausea/vomiting thought related to diabetic gastroparesis, reglan TID AC HS was added. protonix and zofran was also used. Endorses persistent nausea.  Also found to have severe orthostasis, and was previously on midodrine. Workup unrevealing including normal TSH, free T4 and cosyntropin stim test, and normal aldosterone-renin level. ?autonomic dysfunction from diabetes. Trial of fluorinef caused hypokalemia so she was transitioned to pyridostigmine per neurology recommendations but this caused GI side effects so she was returned to midodrine.   Also found to have multiple CVAs - thought peri-CABG. Normal echo, normal CTA neck/head. Continued plavix and aspirin and lipitor.   HCAP - treated with IV vanc/cefepime then transitioned to oral doxy/levaquin. Completing course, cough has fully resolved.   UTI - pansensitive Klebsiella treated with cipro/rocephin.   Uncontrolled T2DM - on low dose levemir and sliding scale in hospital. Tradjenta and metformin were restarted upon discharge. Sugars fasting 80-132. Now off insulin.  Persistent marked dyspnea on exertion. No chest pain or other pain. Occasional dizziness with walking. No fevers/chills since she's been home.   Admit date: 07/07/2014 Discharge date: 07/18/2014 Recommendations for Outpatient Follow-up:  1. outpt GI follow up if Nausea continues to be an issue  Discharge Condition: stable Diet recommendation: diabetic, low  sodium, heart healthy Discharge Diagnoses:  Principal Problem:  Nausea without vomiting Active Problems:  Diabetes mellitus type 2, uncontrolled  UTI (lower urinary tract infection)  S/P CABG x 3, April 16  CVA (cerebral infarction), post CABG  Autonomic orthostatic hypotension  HCAP (healthcare-associated pneumonia)  Relevant past medical, surgical, family and social history reviewed and updated as indicated. Interim medical history since our last visit reviewed. Allergies and medications reviewed and updated. Current Outpatient Prescriptions on File Prior to Visit  Medication Sig  . acetaminophen (TYLENOL) 325 MG tablet Take 650 mg by mouth every 4 (four) hours as needed (Temp greater than than 101F).  Marland Kitchen aspirin EC 81 MG tablet Take 81 mg by mouth daily. Take for 30 days then increase to 325mg  daily  . atorvastatin (LIPITOR) 20 MG tablet Take 20 mg by mouth at bedtime.  . clopidogrel (PLAVIX) 75 MG tablet Take 75 mg by mouth daily.  . famotidine (PEPCID) 20 MG tablet Take 20 mg by mouth at bedtime.  . feeding supplement, GLUCERNA SHAKE, (GLUCERNA SHAKE) LIQD Take 237 mLs by mouth 3 (three) times daily between meals.  . ferrous sulfate 325 (65 FE) MG tablet Take 325 mg by mouth daily.  . folic acid (FOLVITE) 1 MG tablet Take 1 mg by mouth daily.  Marland Kitchen gabapentin (NEURONTIN) 100 MG capsule Take 1 capsule (100 mg total) by mouth 3 (three) times daily.  . hydrocortisone cream 1 % Apply 1 application topically 2 (two) times daily. (Patient taking differently: Apply 1 application topically 2 (two) times daily as needed (psoriasis). )  . linagliptin (TRADJENTA) 5 MG TABS tablet Take 5 mg by mouth daily.  . metFORMIN (GLUCOPHAGE) 500 MG  tablet Take 1 tablet (500 mg total) by mouth 2 (two) times daily with a meal.  . metoCLOPramide (REGLAN) 10 MG tablet Take 1 tablet (10 mg total) by mouth 4 (four) times daily -  before meals and at bedtime.  . ondansetron (ZOFRAN-ODT) 4 MG disintegrating  tablet Take 4 mg by mouth every 6 (six) hours.   . sertraline (ZOLOFT) 50 MG tablet Take 50 mg by mouth daily.  . Vitamin D, Ergocalciferol, (DRISDOL) 50000 UNITS CAPS capsule Take 50,000 Units by mouth every 7 (seven) days. No directions listed on how to take medication    Just has #10 as amount  . pantoprazole (PROTONIX) 40 MG tablet Take 1 tablet (40 mg total) by mouth daily. Switch for any other PPI at similar dose and frequency (Patient not taking: Reported on 07/21/2014)  . senna (SENOKOT) 8.6 MG TABS tablet Take 2 tablets by mouth at bedtime as needed for mild constipation (constipation).   No current facility-administered medications on file prior to visit.    Review of Systems Per HPI unless specifically indicated above     Objective:    BP 130/80 mmHg  Pulse 120  Temp(Src) 97.5 F (36.4 C) (Oral)  Wt 131 lb 4 oz (59.535 kg)  Wt Readings from Last 3 Encounters:  07/21/14 131 lb 4 oz (59.535 kg)  07/17/14 134 lb 3.2 oz (60.873 kg)  03/28/14 141 lb (63.957 kg)    Physical Exam  Constitutional: She appears well-developed and well-nourished. No distress.  HENT:  Mouth/Throat: Oropharynx is clear and moist. No oropharyngeal exudate.  Eyes: Conjunctivae and EOM are normal. Pupils are equal, round, and reactive to light.  Neck: Normal range of motion. Neck supple.  Cardiovascular: Regular rhythm, normal heart sounds and intact distal pulses.  Tachycardia present.   No murmur heard. Pulmonary/Chest: Effort normal and breath sounds normal. No respiratory distress. She has no wheezes. She has no rales.  Musculoskeletal: She exhibits no edema.  Lymphadenopathy:    She has no cervical adenopathy.  Skin: Skin is warm and dry. No rash noted.  Psychiatric: She exhibits a depressed mood.  Nursing note and vitals reviewed.      Assessment & Plan:  Over 40 minutes were spent face-to-face with the patient during this encounter and >50% of that time was spent on counseling and  coordination of care.  Problem List Items Addressed This Visit    Autonomic orthostatic hypotension    Thought diabetes related. Refilled midodrine. Will refer to cardiology for this and for CAD s/p CABG last month.      Relevant Medications   EPINEPHrine 0.3 mg/0.3 mL IJ SOAJ injection   midodrine (PROAMATINE) 10 MG tablet   Other Relevant Orders   Ambulatory referral to Cardiology   CAD (coronary artery disease), native coronary artery    S/p 3v CABG 06/2014. Echo in hospital with EF 55-60%.      Relevant Medications   EPINEPHrine 0.3 mg/0.3 mL IJ SOAJ injection   midodrine (PROAMATINE) 10 MG tablet   CVA (cerebral infarction), post CABG    Discussed continued plavix and aspirin 81mg  daily - then after 30d increase to 325mg  daily.      DM (diabetes mellitus), type 2, uncontrolled w/neurologic complication    Endorses great control based on fasting labs recently. Continue tradjenta and metformin. Lab Results  Component Value Date   HGBA1C 8.2* 07/08/2014         HLD (hyperlipidemia)    Continue lipitor 20mg  daily.  Relevant Medications   EPINEPHrine 0.3 mg/0.3 mL IJ SOAJ injection   midodrine (PROAMATINE) 10 MG tablet   HYPERTENSION, BENIGN SYSTEMIC    This has resolved - now more orthostatic hypotension      Relevant Medications   EPINEPHrine 0.3 mg/0.3 mL IJ SOAJ injection   midodrine (PROAMATINE) 10 MG tablet   Idiopathic angioedema    Idiopathic angioedema to unclear trigger - epi pen refilled.      MDD (major depressive disorder), recurrent episode, moderate    Discussed common for depression to set in after major cardiovascular event. Pt has been on zoloft 50mg  for about 1 week. Encouraged give more time for effect. Reassess next visit.      Relevant Medications   hydrOXYzine (ATARAX/VISTARIL) 10 MG tablet   Nausea without vomiting - Primary    Thought diabetic gastroparesis related - continue reglan 10mg  QID AC HS. Discussed monitoring for any  tardive dyskinesia.      Protein-calorie malnutrition    Latest Alb 2.8 in hospital. Encouraged daily glucerna. Gastroparesis remains a concern - if not improving into next month will refer to GI.      S/P CABG x 3, April 16 (Chronic)    Will refer to local cardiologist.      Relevant Orders   Ambulatory referral to Cardiology       Follow up plan: Return in about 4 weeks (around 08/18/2014), or as needed, for follow up visit.

## 2014-07-23 ENCOUNTER — Encounter: Payer: Self-pay | Admitting: Family Medicine

## 2014-07-23 DIAGNOSIS — E46 Unspecified protein-calorie malnutrition: Secondary | ICD-10-CM | POA: Insufficient documentation

## 2014-07-23 NOTE — Addendum Note (Signed)
Addended by: [name] on: 07/23/2014 09:51 AM   Modules accepted: Level of Service

## 2014-07-23 NOTE — Assessment & Plan Note (Signed)
Latest Alb 2.8 in hospital. Encouraged daily glucerna. Gastroparesis remains a concern - if not improving into next month will refer to GI.

## 2014-07-23 NOTE — Assessment & Plan Note (Addendum)
S/p 3v CABG 06/2014. Echo in hospital with EF 55-60%.

## 2014-08-07 ENCOUNTER — Encounter (HOSPITAL_COMMUNITY): Payer: Self-pay | Admitting: *Deleted

## 2014-08-07 ENCOUNTER — Emergency Department (HOSPITAL_COMMUNITY): Payer: Medicare Other

## 2014-08-07 ENCOUNTER — Inpatient Hospital Stay (HOSPITAL_COMMUNITY)
Admission: EM | Admit: 2014-08-07 | Discharge: 2014-08-22 | DRG: 871 | Disposition: A | Discharge status: Rehabilitation Facility | Payer: Medicare Other | Attending: Internal Medicine | Admitting: Internal Medicine

## 2014-08-07 DIAGNOSIS — F329 Major depressive disorder, single episode, unspecified: Secondary | ICD-10-CM | POA: Diagnosis present

## 2014-08-07 DIAGNOSIS — E876 Hypokalemia: Secondary | ICD-10-CM | POA: Diagnosis not present

## 2014-08-07 DIAGNOSIS — I472 Ventricular tachycardia: Secondary | ICD-10-CM | POA: Diagnosis not present

## 2014-08-07 DIAGNOSIS — E440 Moderate protein-calorie malnutrition: Secondary | ICD-10-CM | POA: Diagnosis present

## 2014-08-07 DIAGNOSIS — I10 Essential (primary) hypertension: Secondary | ICD-10-CM | POA: Diagnosis not present

## 2014-08-07 DIAGNOSIS — Z951 Presence of aortocoronary bypass graft: Secondary | ICD-10-CM | POA: Diagnosis not present

## 2014-08-07 DIAGNOSIS — I2510 Atherosclerotic heart disease of native coronary artery without angina pectoris: Secondary | ICD-10-CM | POA: Diagnosis not present

## 2014-08-07 DIAGNOSIS — J90 Pleural effusion, not elsewhere classified: Secondary | ICD-10-CM | POA: Diagnosis present

## 2014-08-07 DIAGNOSIS — I5033 Acute on chronic diastolic (congestive) heart failure: Secondary | ICD-10-CM | POA: Diagnosis present

## 2014-08-07 DIAGNOSIS — Z7901 Long term (current) use of anticoagulants: Secondary | ICD-10-CM | POA: Diagnosis not present

## 2014-08-07 DIAGNOSIS — N179 Acute kidney failure, unspecified: Secondary | ICD-10-CM | POA: Diagnosis not present

## 2014-08-07 DIAGNOSIS — M6281 Muscle weakness (generalized): Secondary | ICD-10-CM | POA: Diagnosis not present

## 2014-08-07 DIAGNOSIS — E1165 Type 2 diabetes mellitus with hyperglycemia: Secondary | ICD-10-CM | POA: Diagnosis present

## 2014-08-07 DIAGNOSIS — R0602 Shortness of breath: Secondary | ICD-10-CM | POA: Diagnosis not present

## 2014-08-07 DIAGNOSIS — A419 Sepsis, unspecified organism: Principal | ICD-10-CM | POA: Diagnosis present

## 2014-08-07 DIAGNOSIS — Z7902 Long term (current) use of antithrombotics/antiplatelets: Secondary | ICD-10-CM | POA: Diagnosis not present

## 2014-08-07 DIAGNOSIS — I4729 Other ventricular tachycardia: Secondary | ICD-10-CM | POA: Insufficient documentation

## 2014-08-07 DIAGNOSIS — J189 Pneumonia, unspecified organism: Secondary | ICD-10-CM | POA: Diagnosis present

## 2014-08-07 DIAGNOSIS — D649 Anemia, unspecified: Secondary | ICD-10-CM | POA: Diagnosis present

## 2014-08-07 DIAGNOSIS — F331 Major depressive disorder, recurrent, moderate: Secondary | ICD-10-CM | POA: Diagnosis present

## 2014-08-07 DIAGNOSIS — E46 Unspecified protein-calorie malnutrition: Secondary | ICD-10-CM | POA: Diagnosis present

## 2014-08-07 DIAGNOSIS — E119 Type 2 diabetes mellitus without complications: Secondary | ICD-10-CM | POA: Diagnosis not present

## 2014-08-07 DIAGNOSIS — L80 Vitiligo: Secondary | ICD-10-CM | POA: Diagnosis present

## 2014-08-07 DIAGNOSIS — R0600 Dyspnea, unspecified: Secondary | ICD-10-CM | POA: Diagnosis not present

## 2014-08-07 DIAGNOSIS — K3184 Gastroparesis: Secondary | ICD-10-CM | POA: Diagnosis present

## 2014-08-07 DIAGNOSIS — J948 Other specified pleural conditions: Secondary | ICD-10-CM | POA: Diagnosis not present

## 2014-08-07 DIAGNOSIS — I951 Orthostatic hypotension: Secondary | ICD-10-CM | POA: Diagnosis present

## 2014-08-07 DIAGNOSIS — B1920 Unspecified viral hepatitis C without hepatic coma: Secondary | ICD-10-CM | POA: Diagnosis present

## 2014-08-07 DIAGNOSIS — Z8673 Personal history of transient ischemic attack (TIA), and cerebral infarction without residual deficits: Secondary | ICD-10-CM

## 2014-08-07 DIAGNOSIS — R51 Headache: Secondary | ICD-10-CM | POA: Diagnosis not present

## 2014-08-07 DIAGNOSIS — I252 Old myocardial infarction: Secondary | ICD-10-CM | POA: Diagnosis not present

## 2014-08-07 DIAGNOSIS — Y95 Nosocomial condition: Secondary | ICD-10-CM | POA: Diagnosis present

## 2014-08-07 DIAGNOSIS — E1141 Type 2 diabetes mellitus with diabetic mononeuropathy: Secondary | ICD-10-CM | POA: Diagnosis not present

## 2014-08-07 DIAGNOSIS — E1143 Type 2 diabetes mellitus with diabetic autonomic (poly)neuropathy: Secondary | ICD-10-CM | POA: Diagnosis present

## 2014-08-07 DIAGNOSIS — E1149 Type 2 diabetes mellitus with other diabetic neurological complication: Secondary | ICD-10-CM

## 2014-08-07 DIAGNOSIS — I6389 Other cerebral infarction: Secondary | ICD-10-CM

## 2014-08-07 DIAGNOSIS — F333 Major depressive disorder, recurrent, severe with psychotic symptoms: Secondary | ICD-10-CM

## 2014-08-07 DIAGNOSIS — I5031 Acute diastolic (congestive) heart failure: Secondary | ICD-10-CM | POA: Diagnosis not present

## 2014-08-07 DIAGNOSIS — E785 Hyperlipidemia, unspecified: Secondary | ICD-10-CM | POA: Diagnosis present

## 2014-08-07 DIAGNOSIS — Z9889 Other specified postprocedural states: Secondary | ICD-10-CM | POA: Diagnosis not present

## 2014-08-07 DIAGNOSIS — F332 Major depressive disorder, recurrent severe without psychotic features: Secondary | ICD-10-CM | POA: Diagnosis present

## 2014-08-07 DIAGNOSIS — IMO0002 Reserved for concepts with insufficient information to code with codable children: Secondary | ICD-10-CM

## 2014-08-07 DIAGNOSIS — Z794 Long term (current) use of insulin: Secondary | ICD-10-CM | POA: Diagnosis not present

## 2014-08-07 DIAGNOSIS — Z7982 Long term (current) use of aspirin: Secondary | ICD-10-CM | POA: Diagnosis not present

## 2014-08-07 DIAGNOSIS — Z8701 Personal history of pneumonia (recurrent): Secondary | ICD-10-CM | POA: Diagnosis not present

## 2014-08-07 LAB — CBC WITH DIFFERENTIAL/PLATELET
BASOS ABS: 0.1 10*3/uL (ref 0.0–0.1)
Basophils Relative: 1 % (ref 0–1)
EOS PCT: 6 % — AB (ref 0–5)
Eosinophils Absolute: 0.4 10*3/uL (ref 0.0–0.7)
HCT: 38.7 % (ref 36.0–46.0)
Hemoglobin: 13.3 g/dL (ref 12.0–15.0)
Lymphocytes Relative: 27 % (ref 12–46)
Lymphs Abs: 2.1 10*3/uL (ref 0.7–4.0)
MCH: 29.6 pg (ref 26.0–34.0)
MCHC: 34.4 g/dL (ref 30.0–36.0)
MCV: 86.2 fL (ref 78.0–100.0)
MONOS PCT: 9 % (ref 3–12)
Monocytes Absolute: 0.7 10*3/uL (ref 0.1–1.0)
Neutro Abs: 4.5 10*3/uL (ref 1.7–7.7)
Neutrophils Relative %: 57 % (ref 43–77)
Platelets: 292 10*3/uL (ref 150–400)
RBC: 4.49 MIL/uL (ref 3.87–5.11)
RDW: 13.2 % (ref 11.5–15.5)
WBC: 7.7 10*3/uL (ref 4.0–10.5)

## 2014-08-07 LAB — GLUCOSE, CAPILLARY
Glucose-Capillary: 157 mg/dL — ABNORMAL HIGH (ref 65–99)
Glucose-Capillary: 134 mg/dL — ABNORMAL HIGH (ref 65–99)
Glucose-Capillary: 240 mg/dL — ABNORMAL HIGH (ref 65–99)

## 2014-08-07 LAB — LIPASE, BLOOD: Lipase: 34 U/L (ref 22–51)

## 2014-08-07 LAB — URINALYSIS, ROUTINE W REFLEX MICROSCOPIC
BILIRUBIN URINE: NEGATIVE
GLUCOSE, UA: NEGATIVE mg/dL
HGB URINE DIPSTICK: NEGATIVE
KETONES UR: NEGATIVE mg/dL
LEUKOCYTES UA: NEGATIVE
NITRITE: NEGATIVE
Protein, ur: NEGATIVE mg/dL
Specific Gravity, Urine: 1.013 (ref 1.005–1.030)
Urobilinogen, UA: 0.2 mg/dL (ref 0.0–1.0)
pH: 6 (ref 5.0–8.0)

## 2014-08-07 LAB — COMPREHENSIVE METABOLIC PANEL
ALBUMIN: 3.8 g/dL (ref 3.5–5.0)
ALT: 14 U/L (ref 14–54)
ANION GAP: 12 (ref 5–15)
AST: 22 U/L (ref 15–41)
Alkaline Phosphatase: 82 U/L (ref 38–126)
BUN: 11 mg/dL (ref 6–20)
CO2: 22 mmol/L (ref 22–32)
CREATININE: 0.92 mg/dL (ref 0.44–1.00)
Calcium: 9.5 mg/dL (ref 8.9–10.3)
Chloride: 104 mmol/L (ref 101–111)
GFR calc Af Amer: >60 mL/min (ref >60)
GLUCOSE: 172 mg/dL — AB (ref 65–99)
POTASSIUM: 3.8 mmol/L (ref 3.5–5.1)
Sodium: 138 mmol/L (ref 135–145)
Total Bilirubin: 0.6 mg/dL (ref 0.3–1.2)
Total Protein: 8 g/dL (ref 6.5–8.1)

## 2014-08-07 LAB — CBG MONITORING, ED: Glucose-Capillary: 165 mg/dL — ABNORMAL HIGH (ref 65–99)

## 2014-08-07 LAB — D-DIMER, QUANTITATIVE: D-Dimer, Quant: 2.21 ug/mL-FEU — ABNORMAL HIGH (ref 0.00–0.48)

## 2014-08-07 LAB — I-STAT TROPONIN, ED: TROPONIN I, POC: 0 ng/mL (ref 0.00–0.08)

## 2014-08-07 LAB — BRAIN NATRIURETIC PEPTIDE: B NATRIURETIC PEPTIDE 5: 297.8 pg/mL — AB (ref 0.0–100.0)

## 2014-08-07 MED ORDER — ASPIRIN EC 81 MG PO TBEC
81.0000 mg | DELAYED_RELEASE_TABLET | Freq: Every day | ORAL | Status: DC
  Administered 2014-08-07 – 2014-08-22 (×16): 81 mg via ORAL
  Filled 2014-08-07 (×16): qty 1

## 2014-08-07 MED ORDER — VANCOMYCIN HCL 1000 MG IV SOLR: 15.0000 mg/kg | Freq: Once | INTRAVENOUS | Status: DC

## 2014-08-07 MED ORDER — SERTRALINE HCL 50 MG PO TABS
50.0000 mg | ORAL_TABLET | Freq: Every day | ORAL | Status: DC
  Administered 2014-08-07 – 2014-08-22 (×16): 50 mg via ORAL
  Filled 2014-08-07 (×16): qty 1

## 2014-08-07 MED ORDER — SODIUM CHLORIDE 0.9 % IV BOLUS (SEPSIS): 1000.0000 mL | Freq: Once | INTRAVENOUS | Status: DC

## 2014-08-07 MED ORDER — IOHEXOL 350 MG/ML SOLN
80.0000 mL | Freq: Once | INTRAVENOUS | Status: AC | PRN
  Administered 2014-08-07: 80 mL via INTRAVENOUS

## 2014-08-07 MED ORDER — VANCOMYCIN HCL IN DEXTROSE 750-5 MG/150ML-% IV SOLN
750.0000 mg | Freq: Once | INTRAVENOUS | Status: AC
  Administered 2014-08-07: 750 mg via INTRAVENOUS
  Filled 2014-08-07: qty 150

## 2014-08-07 MED ORDER — METOCLOPRAMIDE HCL 10 MG PO TABS
10.0000 mg | ORAL_TABLET | Freq: Four times a day (QID) | ORAL | Status: DC | PRN
  Administered 2014-08-13 – 2014-08-15 (×3): 10 mg via ORAL
  Filled 2014-08-07 (×4): qty 1

## 2014-08-07 MED ORDER — GABAPENTIN 100 MG PO CAPS
100.0000 mg | ORAL_CAPSULE | Freq: Three times a day (TID) | ORAL | Status: DC
  Administered 2014-08-07 – 2014-08-22 (×45): 100 mg via ORAL
  Filled 2014-08-07 (×46): qty 1

## 2014-08-07 MED ORDER — MIDODRINE HCL 5 MG PO TABS
10.0000 mg | ORAL_TABLET | Freq: Three times a day (TID) | ORAL | Status: DC
  Filled 2014-08-07 (×4): qty 2

## 2014-08-07 MED ORDER — ENSURE ENLIVE PO LIQD
237.0000 mL | Freq: Two times a day (BID) | ORAL | Status: DC
  Administered 2014-08-08 – 2014-08-15 (×15): 237 mL via ORAL

## 2014-08-07 MED ORDER — FERROUS SULFATE 325 (65 FE) MG PO TABS
325.0000 mg | ORAL_TABLET | Freq: Every day | ORAL | Status: DC
  Administered 2014-08-07 – 2014-08-22 (×17): 325 mg via ORAL
  Filled 2014-08-07 (×16): qty 1

## 2014-08-07 MED ORDER — INSULIN ASPART 100 UNIT/ML ~~LOC~~ SOLN
0.0000 [IU] | Freq: Three times a day (TID) | SUBCUTANEOUS | Status: DC
  Administered 2014-08-08: 5 [IU] via SUBCUTANEOUS
  Administered 2014-08-08: 3 [IU] via SUBCUTANEOUS
  Administered 2014-08-08: 1 [IU] via SUBCUTANEOUS
  Administered 2014-08-09: 3 [IU] via SUBCUTANEOUS
  Administered 2014-08-09 – 2014-08-10 (×4): 2 [IU] via SUBCUTANEOUS
  Administered 2014-08-10: 3 [IU] via SUBCUTANEOUS
  Administered 2014-08-11: 1 [IU] via SUBCUTANEOUS
  Administered 2014-08-11: 2 [IU] via SUBCUTANEOUS
  Administered 2014-08-12: 3 [IU] via SUBCUTANEOUS
  Administered 2014-08-13 – 2014-08-14 (×3): 1 [IU] via SUBCUTANEOUS
  Administered 2014-08-15: 2 [IU] via SUBCUTANEOUS
  Administered 2014-08-16: 1 [IU] via SUBCUTANEOUS
  Administered 2014-08-16: 2 [IU] via SUBCUTANEOUS
  Administered 2014-08-17 – 2014-08-19 (×5): 1 [IU] via SUBCUTANEOUS
  Administered 2014-08-20: 3 [IU] via SUBCUTANEOUS
  Administered 2014-08-20: 1 [IU] via SUBCUTANEOUS
  Administered 2014-08-21 – 2014-08-22 (×3): 2 [IU] via SUBCUTANEOUS

## 2014-08-07 MED ORDER — POTASSIUM CHLORIDE CRYS ER 20 MEQ PO TBCR
40.0000 meq | EXTENDED_RELEASE_TABLET | Freq: Every day | ORAL | Status: DC
  Administered 2014-08-07 – 2014-08-22 (×16): 40 meq via ORAL
  Filled 2014-08-07 (×17): qty 2

## 2014-08-07 MED ORDER — VANCOMYCIN HCL 500 MG IV SOLR
500.0000 mg | Freq: Two times a day (BID) | INTRAVENOUS | Status: DC
  Administered 2014-08-08 – 2014-08-09 (×3): 500 mg via INTRAVENOUS
  Filled 2014-08-07 (×6): qty 500

## 2014-08-07 MED ORDER — SENNA 8.6 MG PO TABS
2.0000 | ORAL_TABLET | Freq: Every evening | ORAL | Status: DC | PRN
  Administered 2014-08-08: 17.2 mg via ORAL
  Filled 2014-08-07: qty 2

## 2014-08-07 MED ORDER — ACETAMINOPHEN 500 MG PO TABS
1000.0000 mg | ORAL_TABLET | Freq: Once | ORAL | Status: AC
  Administered 2014-08-07: 1000 mg via ORAL
  Filled 2014-08-07: qty 2

## 2014-08-07 MED ORDER — CARVEDILOL 3.125 MG PO TABS
3.1250 mg | ORAL_TABLET | Freq: Two times a day (BID) | ORAL | Status: DC
  Administered 2014-08-07: 3.125 mg via ORAL
  Filled 2014-08-07 (×4): qty 1

## 2014-08-07 MED ORDER — FAMOTIDINE 20 MG PO TABS
20.0000 mg | ORAL_TABLET | Freq: Every day | ORAL | Status: DC
  Administered 2014-08-07 – 2014-08-21 (×16): 20 mg via ORAL
  Filled 2014-08-07 (×18): qty 1

## 2014-08-07 MED ORDER — LINAGLIPTIN 5 MG PO TABS
5.0000 mg | ORAL_TABLET | Freq: Every day | ORAL | Status: DC
  Administered 2014-08-07 – 2014-08-22 (×16): 5 mg via ORAL
  Filled 2014-08-07 (×16): qty 1

## 2014-08-07 MED ORDER — HYDROCODONE-ACETAMINOPHEN 5-325 MG PO TABS: 1.0000 | ORAL_TABLET | ORAL | Status: DC | PRN

## 2014-08-07 MED ORDER — FOLIC ACID 1 MG PO TABS
1.0000 mg | ORAL_TABLET | Freq: Every day | ORAL | Status: DC
  Administered 2014-08-07 – 2014-08-22 (×16): 1 mg via ORAL
  Filled 2014-08-07 (×16): qty 1

## 2014-08-07 MED ORDER — ATORVASTATIN CALCIUM 20 MG PO TABS
20.0000 mg | ORAL_TABLET | Freq: Every day | ORAL | Status: DC
  Administered 2014-08-07 – 2014-08-21 (×15): 20 mg via ORAL
  Filled 2014-08-07 (×16): qty 1

## 2014-08-07 MED ORDER — CLOPIDOGREL BISULFATE 75 MG PO TABS
75.0000 mg | ORAL_TABLET | Freq: Every day | ORAL | Status: DC
  Administered 2014-08-07 – 2014-08-22 (×16): 75 mg via ORAL
  Filled 2014-08-07 (×16): qty 1

## 2014-08-07 MED ORDER — FUROSEMIDE 10 MG/ML IJ SOLN
20.0000 mg | Freq: Every day | INTRAMUSCULAR | Status: DC
  Administered 2014-08-07 – 2014-08-08 (×2): 20 mg via INTRAVENOUS
  Filled 2014-08-07 (×2): qty 2

## 2014-08-07 MED ORDER — GLUCERNA SHAKE PO LIQD
237.0000 mL | Freq: Three times a day (TID) | ORAL | Status: DC
  Administered 2014-08-07 – 2014-08-09 (×4): 237 mL via ORAL

## 2014-08-07 MED ORDER — ENOXAPARIN SODIUM 40 MG/0.4ML ~~LOC~~ SOLN
40.0000 mg | SUBCUTANEOUS | Status: DC
  Administered 2014-08-07 – 2014-08-09 (×3): 40 mg via SUBCUTANEOUS
  Filled 2014-08-07 (×4): qty 0.4

## 2014-08-07 MED ORDER — SODIUM CHLORIDE 0.9 % IJ SOLN
3.0000 mL | Freq: Two times a day (BID) | INTRAMUSCULAR | Status: DC
  Administered 2014-08-07 – 2014-08-22 (×28): 3 mL via INTRAVENOUS

## 2014-08-07 MED ORDER — DEXTROSE 5 % IV SOLN
1.0000 g | Freq: Two times a day (BID) | INTRAVENOUS | Status: DC
  Administered 2014-08-07 – 2014-08-09 (×4): 1 g via INTRAVENOUS
  Filled 2014-08-07 (×6): qty 1

## 2014-08-07 MED ORDER — ACETAMINOPHEN 325 MG PO TABS
650.0000 mg | ORAL_TABLET | ORAL | Status: DC | PRN
  Administered 2014-08-08 – 2014-08-21 (×10): 650 mg via ORAL
  Filled 2014-08-07 (×11): qty 2

## 2014-08-07 MED ORDER — HYDROCORTISONE 1 % EX CREA
1.0000 "application " | TOPICAL_CREAM | Freq: Two times a day (BID) | CUTANEOUS | Status: DC | PRN
  Filled 2014-08-07: qty 28

## 2014-08-07 NOTE — ED Notes (Signed)
Pharmacy contact notifying that vancomycin dose for pt exceed renal function capacity and request to have permission to dose appropriately.  Permission granted.    Domenic Moras, PA-C 08/07/14 [place], MD 08/07/14 1739

## 2014-08-07 NOTE — ED Notes (Signed)
Patient returned from X-ray 

## 2014-08-07 NOTE — ED Notes (Signed)
Patient transported to X-ray 
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
## 2014-08-07 NOTE — ED Notes (Signed)
Pt reports short of breath, pain in right side, and reports jitteriness.  Pt states she is diabetic and her blood sugar was 82.  Pt reports cramping to legs. Pt reports headache that started yesterday evening on right side. Pt had OHS in 06/2014 and then had a stroke after surgery.  Pt is neuro intact except for headache and tremors

## 2014-08-07 NOTE — Progress Notes (Signed)
ANTIBIOTIC CONSULT NOTE - INITIAL  Pharmacy Consult for Cefepime and Vancomycin Indication: HCAP  Allergies  Allergen Reactions  . Codeine Nausea And Vomiting  . Penicillins Hives and Swelling  . Sulfa Antibiotics Nausea And Vomiting  *Note penicillin allergy however patient has had cephalosporins since this reaction without a reaction.   Patient Measurements:     Vital Signs: Temp: 97.2 F (36.2 C) (05/29 1328) Temp Source: Oral (05/29 1328) BP: 144/79 mmHg (05/29 1703) Pulse Rate: 106 (05/29 1703) Intake/Output from previous day:   Intake/Output from this shift:    Labs:  Recent Labs  08/07/14 1344  WBC 7.7  HGB 13.3  PLT 292  CREATININE 0.92   CrCl cannot be calculated (Unknown ideal weight.). No results for input(s): VANCOTROUGH, VANCOPEAK, VANCORANDOM, GENTTROUGH, GENTPEAK, GENTRANDOM, TOBRATROUGH, TOBRAPEAK, TOBRARND, AMIKACINPEAK, AMIKACINTROU, AMIKACIN in the last 72 hours.   Microbiology: Recent Results (from the past 720 hour(s))  Clostridium Difficile by PCR     Status: None   Collection Time: 07/16/14  9:16 AM  Result Value Ref Range Status   C difficile by pcr NEGATIVE NEGATIVE Final    Medical History: Past Medical History  Diagnosis Date  . Hypertension   . Multiple allergies     mold, wool, dust, feathers  . Angioedema   . HLD (hyperlipidemia)   . Hx of migraines     remote  . S/P lens implant     left side ([name])  . Vitiligo   . History of chicken pox   . Dermatitis     eval [name] 2011: eczema, eval [name] 2011: bx negative for lichen simplex or derm herpetiformis  . Autoimmune deficiency syndrome   . CAD (coronary artery disease), native coronary artery 06/2014    s/p CABG  . UTI (urinary tract infection) 06/2014  . CVA (cerebral infarction) 07/2014    bilateral corona radiata - periCABG  . HCAP (healthcare-associated pneumonia) 06/2014  . DM (diabetes mellitus), type 2, uncontrolled w/neurologic complication 07/05/621   ?autonomic neuropathy, gastroparesis (06/2014)     Medications:  Anti-infectives    Start     Dose/Rate Route Frequency Ordered Stop   08/07/14 1730  vancomycin (VANCOCIN) 15 mg/kg in sodium chloride 0.9 % 100 mL IVPB     15 mg/kg 100 mL/hr over 60 Minutes Intravenous  Once 08/07/14 1726       Assessment: 69 year old female in ED with SOB and R-sided pain and CT with consolidation concerning for HCAP to start cefepime and vancomycin per pharmacy dosing.   *Note penicillin allergy however patient has had cephalosporins (in 2015 and 2016) since this reaction in 2012 without a reaction.   SCr 0.92 with estimated CrCl ~ 50- 55 mL/min.  Blood cultures pending.   Goal of Therapy:  Vancomycin trough level 15-20 mcg/ml  Plan:  Cefepime 1g IV q12h.  Vancomycin 750mg  IV x1 then 500mg  IV every 12 hours.  Monitor renal function, clinical status, and culture results.   Sloan Leiter, PharmD, BCPS Clinical Pharmacist [phone] 08/07/2014,5:30 PM
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
## 2014-08-08 ENCOUNTER — Inpatient Hospital Stay (HOSPITAL_COMMUNITY): Payer: Medicare Other

## 2014-08-08 DIAGNOSIS — R0600 Dyspnea, unspecified: Secondary | ICD-10-CM

## 2014-08-08 LAB — LACTATE DEHYDROGENASE, PLEURAL OR PERITONEAL FLUID: LD, Fluid: 118 U/L — ABNORMAL HIGH (ref 3–23)

## 2014-08-08 LAB — BASIC METABOLIC PANEL
Anion gap: 11 (ref 5–15)
BUN: 11 mg/dL (ref 6–20)
CALCIUM: 9.6 mg/dL (ref 8.9–10.3)
CO2: 22 mmol/L (ref 22–32)
CREATININE: 1.04 mg/dL — AB (ref 0.44–1.00)
Chloride: 103 mmol/L (ref 101–111)
GFR calc Af Amer: >60 mL/min (ref >60)
GFR calc non Af Amer: 53 mL/min — ABNORMAL LOW (ref >60)
Glucose, Bld: 187 mg/dL — ABNORMAL HIGH (ref 65–99)
POTASSIUM: 4.1 mmol/L (ref 3.5–5.1)
SODIUM: 136 mmol/L (ref 135–145)

## 2014-08-08 LAB — GLUCOSE, CAPILLARY
GLUCOSE-CAPILLARY: 149 mg/dL — AB (ref 65–99)
GLUCOSE-CAPILLARY: 150 mg/dL — AB (ref 65–99)
Glucose-Capillary: 207 mg/dL — ABNORMAL HIGH (ref 65–99)
Glucose-Capillary: 259 mg/dL — ABNORMAL HIGH (ref 65–99)

## 2014-08-08 LAB — GLUCOSE, SEROUS FLUID: GLUCOSE FL: 206 mg/dL

## 2014-08-08 LAB — CBC
HCT: 37.5 % (ref 36.0–46.0)
Hemoglobin: 12.9 g/dL (ref 12.0–15.0)
MCH: 29.8 pg (ref 26.0–34.0)
MCHC: 34.4 g/dL (ref 30.0–36.0)
MCV: 86.6 fL (ref 78.0–100.0)
Platelets: 288 10*3/uL (ref 150–400)
RBC: 4.33 MIL/uL (ref 3.87–5.11)
RDW: 13.3 % (ref 11.5–15.5)
WBC: 8.1 10*3/uL (ref 4.0–10.5)

## 2014-08-08 LAB — BODY FLUID CELL COUNT WITH DIFFERENTIAL
Eos, Fluid: 1 %
Lymphs, Fluid: 82 %
Monocyte-Macrophage-Serous Fluid: 15 % — ABNORMAL LOW (ref 50–90)
Neutrophil Count, Fluid: 2 % (ref 0–25)
Total Nucleated Cell Count, Fluid: 2974 cu mm — ABNORMAL HIGH (ref 0–1000)

## 2014-08-08 LAB — PROTEIN, BODY FLUID: TOTAL PROTEIN, FLUID: 5.7 g/dL

## 2014-08-08 MED ORDER — CARVEDILOL 3.125 MG PO TABS
3.1250 mg | ORAL_TABLET | Freq: Two times a day (BID) | ORAL | Status: DC
  Administered 2014-08-08 – 2014-08-10 (×6): 3.125 mg via ORAL
  Filled 2014-08-08 (×7): qty 1

## 2014-08-08 MED ORDER — FUROSEMIDE 10 MG/ML IJ SOLN
40.0000 mg | Freq: Every day | INTRAMUSCULAR | Status: DC
  Administered 2014-08-09 – 2014-08-10 (×2): 40 mg via INTRAVENOUS
  Filled 2014-08-08 (×3): qty 4

## 2014-08-08 MED ORDER — LIDOCAINE HCL (PF) 1 % IJ SOLN
INTRAMUSCULAR | Status: AC
  Filled 2014-08-08: qty 10

## 2014-08-08 MED ORDER — MIDODRINE HCL 5 MG PO TABS
10.0000 mg | ORAL_TABLET | Freq: Three times a day (TID) | ORAL | Status: DC
  Administered 2014-08-08 – 2014-08-22 (×43): 10 mg via ORAL
  Filled 2014-08-08 (×46): qty 2

## 2014-08-08 NOTE — Progress Notes (Signed)
Initial Nutrition Assessment  DOCUMENTATION CODES:  Non-severe (moderate) malnutrition in context of chronic illness  INTERVENTION:  Ensure Enlive (each supplement provides 350kcal and 20 grams of protein), Glucerna shake  NUTRITION DIAGNOSIS:  Malnutrition related to chronic illness as evidenced by mild depletion of body fat, mild depletion of muscle mass.  GOAL:  Patient will meet greater than or equal to 90% of their needs  MONITOR:  PO intake, Labs, Weight trends, I & O's  REASON FOR ASSESSMENT:  Malnutrition Screening Tool    ASSESSMENT: Pt is a 69 y.o. female with PMH of HTN, DM, CVA, CAD s/p CABG (08/2561) complicated with HCAP (treated 4/28-5/9) presented with worsening SOB, associated with mild productive cough, chills no objective fever. Patient also reports DOE, PNDs and orthopnea.  Pt returned from receiving thoracentesis at time of visit. Breakfast tray at bedside untouched due to pt leaving before able to eat. Getting ready to eat cereal. Pt states she hasn't had much appetite since CABG in April and it is just starting to come back. Per wt history pt lost 15 Lb in 4 mo (11%, significant for time frame). Pt follows 2g Na diet at home due to heart issues.  Pt has order for Ensure BID and Glucerna Shake TID, been drinking all of them.  Encouraged pt to eat before drinking the shakes to, and order protein with every meal.  Nutrition Focused Physical Exam shows mild fat and muscle mass depletion. Will continue to monitor. Labs: Glu 149 - 207, Cr 1.04  Height:  Ht Readings from Last 1 Encounters:  08/07/14 5\' 3"  (1.6 m)    Weight:  Wt Readings from Last 1 Encounters:  08/08/14 126 lb 1.6 oz (57.199 kg)    Ideal Body Weight:  52 kg  Wt Readings from Last 10 Encounters:  08/08/14 126 lb 1.6 oz (57.199 kg)  07/21/14 131 lb 4 oz (59.535 kg)  07/17/14 134 lb 3.2 oz (60.873 kg)  03/28/14 141 lb (63.957 kg)  09/17/13 148 lb (67.132 kg)  06/02/12 139 lb 8.8 oz  (63.3 kg)  05/01/12 144 lb 8 oz (65.545 kg)  04/18/12 142 lb (64.411 kg)  04/17/12 142 lb 12 oz (64.751 kg)  04/06/12 142 lb 8 oz (64.638 kg)    BMI:  Body mass index is 22.34 kg/(m^2).  Estimated Nutritional Needs:  Kcal:  1400 - 1600  Protein:  65 - 80g  Fluid:  > 1.5 L  Skin:  Reviewed, no issues  Diet Order:  Diet heart healthy/carb modified Room service appropriate?: Yes; Fluid consistency:: Thin  EDUCATION NEEDS:  Education needs addressed   Intake/Output Summary (Last 24 hours) at 08/08/14 1232 Last data filed at 08/08/14 1108  Gross per 24 hour  Intake    760 ml  Output    850 ml  Net    -90 ml    Last BM:  5/29  Maximos Zayas A. Laplace Dietetic Intern Pager: [phone] 08/08/2014 12:59 PM

## 2014-08-08 NOTE — Progress Notes (Signed)
  Echocardiogram 2D Echocardiogram has been performed.  Diamond Nickel 08/08/2014, 3:33 PM

## 2014-08-08 NOTE — Procedures (Signed)
US guided L thora  330 cc dark yellow fluid Sent for labs  Tolerated well  cxr pending

## 2014-08-08 NOTE — Progress Notes (Signed)
Maria Fox LNL:[id number] DOB: 1946-02-23 DOA: 08/07/2014 PCP: [name], MD  Brief narrative: 49 ? admit 4.29-5.9.16 -diagnosis at time of d/c was non specific n/v-as her sympotms of GI dysfunction had improved, it was felt she could f/u as OP for GI work-up if prn She underwent CABG x 3 Ila 06/2014 -At that OSH, stay she was d/c home on midodrine as it was felt she had Orthostatic hypotension probably related to Diabetic autonomic insufficiency and Endocrinology made recommendations for work-up [see d/c summary 07/18/14] At 07/2014 hospital admission MRI showed subacute CVA She also had HCAP and was Rx Vacn and Cefepime-which double covered for a K PNA Pyelo She saw her PCP Dr. Lynnae Sandhoff and was profoundly weak still but somewhat improved. She returned to [facility] on 5/30 c R sided HA, SOB.  CTA on admission showed Pleural effusion.  BNP on admit was 297 Empirically started on broad spect Abx for presumed Failed HCAP [place] thora performed 5/30 revealing exudate-cultures pending   Past medical history-As per Problem list Chart reviewed as below-   Consultants:  none  Procedures:  [place] thora 5/30! 300 cc  Antibiotics:  Vancomycin 5/29  Cefepime 5/29   Subjective  Well Breathing easier No f/chills Abd pain~ 1 week Never colonoscopy-occ brb on wiping No n/v No cp hungry   Objective    Interim History:   Telemetry:    Objective: Filed Vitals:   08/07/14 1836 08/07/14 2205 08/08/14 0100 08/08/14 0536  BP: 113/102 140/85 121/81 131/87  Pulse: 120 100 89 101  Temp: 97.4 F (36.3 C) 97.8 F (36.6 C) 97 F (36.1 C) 98.2 F (36.8 C)  TempSrc: Oral Oral Oral Oral  Resp: 18 16 16 17   Height: 5\' 3"  (1.6 m)     Weight: 58.151 kg (128 lb 3.2 oz)   57.199 kg (126 lb 1.6 oz)  SpO2: 100% 94% 96% 94%    Intake/Output Summary (Last 24 hours) at 08/08/14 0747 Last data filed at 08/08/14 4081  Gross per 24 hour  Intake    420 ml  Output    850 ml  Net   -430 ml      Exam:  General: eomi ncat Cardiovascular: s1 s 2no m/r/g Respiratory: clear no added sound no m/r/g Abdomen: sodft--RUQ slightly tender-cannot appreciate HJ reflex, noSplenomegally Skin no le edema Neuro intact no asteriixis  Data Reviewed: Basic Metabolic Panel:  Recent Labs Lab 08/07/14 1344 08/08/14 0312  NA 138 136  K 3.8 4.1  CL 104 103  CO2 22 22  GLUCOSE 172* 187*  BUN 11 11  CREATININE 0.92 1.04*  CALCIUM 9.5 9.6   Liver Function Tests:  Recent Labs Lab 08/07/14 1344  AST 22  ALT 14  ALKPHOS 82  BILITOT 0.6  PROT 8.0  ALBUMIN 3.8    Recent Labs Lab 08/07/14 1433  LIPASE 34   No results for input(s): AMMONIA in the last 168 hours. CBC:  Recent Labs Lab 08/07/14 1344 08/08/14 0312  WBC 7.7 8.1  NEUTROABS 4.5  --   HGB 13.3 12.9  HCT 38.7 37.5  MCV 86.2 86.6  PLT 292 288   Cardiac Enzymes: No results for input(s): CKTOTAL, CKMB, CKMBINDEX, TROPONINI in the last 168 hours. BNP: Invalid input(s): POCBNP CBG:  Recent Labs Lab 08/07/14 1342 08/07/14 1420 08/07/14 1831 08/07/14 2155 08/08/14 0631  GLUCAP 157* 165* 134* 240* 149*    No results found for this or any previous visit (from the past 240 hour(s)).  Studies:              All Imaging reviewed and is as per above notation   Scheduled Meds: . aspirin EC  81 mg Oral Daily  . atorvastatin  20 mg Oral QHS  . carvedilol  3.125 mg Oral BID WC  . ceFEPime (MAXIPIME) IV  1 g Intravenous Q12H  . clopidogrel  75 mg Oral Daily  . enoxaparin (LOVENOX) injection  40 mg Subcutaneous Q24H  . famotidine  20 mg Oral QHS  . feeding supplement (ENSURE ENLIVE)  237 mL Oral BID BM  . feeding supplement (GLUCERNA SHAKE)  237 mL Oral TID BM  . ferrous sulfate  325 mg Oral Daily  . folic acid  1 mg Oral Daily  . furosemide  20 mg Intravenous Daily  . gabapentin  100 mg Oral TID  . insulin aspart  0-9 Units Subcutaneous TID WC  . linagliptin  5 mg Oral Daily  . midodrine  10 mg Oral  TID WC  . potassium chloride  40 mEq Oral Daily  . sertraline  50 mg Oral Daily  . sodium chloride  3 mL Intravenous Q12H  . vancomycin  500 mg Intravenous Q12H   Continuous Infusions:    Assessment/Plan:  1. Sepsis 2/2 to Failed HCAP + Parapneumonic effusion-TPeff:TPserum=5.7:8.0=0.71-->exudative effusion.  Continue broad spectrum abx.  Gram stain ADDED ON.  Culture pending-CXR rpt=signif ?L pelural fluid.  Rpt 2 Vw CXr am.  Needs ~ 48 IV abx and narrow based on Pleural cult. 2. Recent CABG x3 [facility]-Stable currently-continue Plavix 75, ASA 81-previously cannot tolerat either ACe/BB 2/2 to hypotension but on admission was tachycardic probably 2/2 to sepsis from #1--tolerating coreg3.125 bid--continue.  Consider ACE in the next 1-2 days 3. Decompensated acute diastolic HF-increase lasix 20-e40 mg daily.  Monitor renal trends 4. Autonomic dysfuction 2/2 to DM [POTS]-continue Midodrine 10 tid.  OP follow up GI as most issues are stable 5. DM ty II Y1O 8.2 03/7508 complicated by DM gastroparesis + neuropathy-sugars 149--259-continue linagliptin 5 + ss1.  Sugars 200 range. 6. presumed OLD cva's-see above 7. Anemia, borderline microcytis-continue Feso4 +folate 8. RUQ abd pain-probable earlyy cirrhosis-Might need HIDA r/o stones-never drinker-could have NASH?-get Acute hepatitis panel  We will ask GI to see her as an OP. CT abd 2010 suggestive early cirrhosis 9. NSVT-keep on tele-on coreg.  Am magensium level 10. Vitiligo at [facility] 11. Moderate P-EM-lost 3 punds since last admit 5/9.  Supplement prn  Code Status: Full Family Communication: none + Disposition Plan: inpatient ~72 hours-therapy to eval in am   Verneita Griffes, MD  [facility] Pager [phone] 08/08/2014, 7:47 AM    LOS: 1 day

## 2014-08-09 ENCOUNTER — Inpatient Hospital Stay (HOSPITAL_COMMUNITY): Payer: Medicare Other

## 2014-08-09 LAB — CBC WITH DIFFERENTIAL/PLATELET
BASOS ABS: 0.1 10*3/uL (ref 0.0–0.1)
Basophils Relative: 1 % (ref 0–1)
Eosinophils Absolute: 0.7 10*3/uL (ref 0.0–0.7)
Eosinophils Relative: 10 % — ABNORMAL HIGH (ref 0–5)
HEMATOCRIT: 37.8 % (ref 36.0–46.0)
HEMOGLOBIN: 12.8 g/dL (ref 12.0–15.0)
LYMPHS PCT: 25 % (ref 12–46)
Lymphs Abs: 1.8 10*3/uL (ref 0.7–4.0)
MCH: 29.6 pg (ref 26.0–34.0)
MCHC: 33.9 g/dL (ref 30.0–36.0)
MCV: 87.3 fL (ref 78.0–100.0)
Monocytes Absolute: 0.8 10*3/uL (ref 0.1–1.0)
Monocytes Relative: 11 % (ref 3–12)
NEUTROS ABS: 3.9 10*3/uL (ref 1.7–7.7)
Neutrophils Relative %: 53 % (ref 43–77)
Platelets: 279 10*3/uL (ref 150–400)
RBC: 4.33 MIL/uL (ref 3.87–5.11)
RDW: 13.4 % (ref 11.5–15.5)
WBC: 7.3 10*3/uL (ref 4.0–10.5)

## 2014-08-09 LAB — COMPREHENSIVE METABOLIC PANEL
ALT: 14 U/L (ref 14–54)
ANION GAP: 8 (ref 5–15)
AST: 22 U/L (ref 15–41)
Albumin: 3.5 g/dL (ref 3.5–5.0)
Alkaline Phosphatase: 72 U/L (ref 38–126)
BUN: 20 mg/dL (ref 6–20)
CHLORIDE: 102 mmol/L (ref 101–111)
CO2: 23 mmol/L (ref 22–32)
CREATININE: 1.28 mg/dL — AB (ref 0.44–1.00)
Calcium: 9.4 mg/dL (ref 8.9–10.3)
GFR calc Af Amer: 48 mL/min — ABNORMAL LOW (ref >60)
GFR, EST NON AFRICAN AMERICAN: 41 mL/min — AB (ref >60)
Glucose, Bld: 159 mg/dL — ABNORMAL HIGH (ref 65–99)
Potassium: 4.6 mmol/L (ref 3.5–5.1)
Sodium: 133 mmol/L — ABNORMAL LOW (ref 135–145)
Total Bilirubin: 0.9 mg/dL (ref 0.3–1.2)
Total Protein: 7.3 g/dL (ref 6.5–8.1)

## 2014-08-09 LAB — HEPATITIS PANEL, ACUTE
HCV Ab: REACTIVE — AB
HEP B S AG: NEGATIVE
Hep A IgM: NONREACTIVE
Hep B C IgM: NONREACTIVE

## 2014-08-09 LAB — GLUCOSE, CAPILLARY
GLUCOSE-CAPILLARY: 131 mg/dL — AB (ref 65–99)
Glucose-Capillary: 161 mg/dL — ABNORMAL HIGH (ref 65–99)
Glucose-Capillary: 234 mg/dL — ABNORMAL HIGH (ref 65–99)
Glucose-Capillary: 182 mg/dL — ABNORMAL HIGH (ref 65–99)

## 2014-08-09 LAB — MAGNESIUM: Magnesium: 1.8 mg/dL (ref 1.7–2.4)

## 2014-08-09 MED ORDER — LEVOFLOXACIN IN D5W 750 MG/150ML IV SOLN
750.0000 mg | INTRAVENOUS | Status: DC
  Administered 2014-08-09: 750 mg via INTRAVENOUS
  Filled 2014-08-09: qty 150

## 2014-08-09 MED ORDER — PROMETHAZINE HCL 25 MG/ML IJ SOLN
12.5000 mg | Freq: Four times a day (QID) | INTRAMUSCULAR | Status: DC | PRN
  Administered 2014-08-09 – 2014-08-19 (×6): 12.5 mg via INTRAVENOUS
  Filled 2014-08-09 (×6): qty 1

## 2014-08-09 MED ORDER — PROMETHAZINE HCL 25 MG PO TABS
12.5000 mg | ORAL_TABLET | Freq: Four times a day (QID) | ORAL | Status: DC | PRN
  Administered 2014-08-09: 12.5 mg via ORAL
  Filled 2014-08-09: qty 1

## 2014-08-09 MED ORDER — POLYETHYLENE GLYCOL 3350 17 G PO PACK
17.0000 g | PACK | Freq: Every day | ORAL | Status: DC
  Administered 2014-08-09: 17 g via ORAL
  Filled 2014-08-09 (×2): qty 1

## 2014-08-09 NOTE — Progress Notes (Signed)
UR COMPLETED  

## 2014-08-09 NOTE — Evaluation (Signed)
Physical Therapy Evaluation/ Discharge Patient Details Name: Maria Fox MRN: [id number] DOB: 01/12/1946 Today's Date: 08/09/2014   History of Present Illness  Maria Fox is a 69 y.o. female with PMH of HTN, DM, CVA, CAD s/p CABG (06/979) complicated with HCAP (treated 4/28-5/9) presented with worsening SOB, associated with mild productive cough, chills no objective fever. Pt with left pleural effusion s/p thoracentesis 5/30  Clinical Impression  Ms.Heckert is moving well at her baseline since her CABG but with limited overall activity tolerance who would benefit from outpatient cardiac rehab for full recovery. Pt reports she was not receiving therapy since leaving SNF. Pt moving well able to ambulate and transfer without assist with sats maintained 97% on RA throughout. Pt with assist of significant other at home and no further physical therapy needs with pt aware and in agreement.     Follow Up Recommendations Other (comment) (outpatient cardiac rehab)    Equipment Recommendations  None recommended by PT    Recommendations for Other Services       Precautions / Restrictions Precautions Precautions: Fall;Sternal Restrictions Weight Bearing Restrictions: No      Mobility  Bed Mobility Overal bed mobility: Modified Independent                Transfers Overall transfer level: Modified independent                  Ambulation/Gait Ambulation/Gait assistance: Modified independent (Device/Increase time) Ambulation Distance (Feet): 400 Feet Assistive device: None Gait Pattern/deviations: WFL(Within Functional Limits)   Gait velocity interpretation: at or above normal speed for age/gender General Gait Details: steady gait with clear hall no LOB  Stairs Stairs: Yes Stairs assistance: Modified independent (Device/Increase time) Stair Management: One rail Left;Forwards;Alternating pattern Number of Stairs: 3    Wheelchair Mobility    Modified Rankin (Stroke  Patients Only)       Balance     Sitting balance-Leahy Scale: Good       Standing balance-Leahy Scale: Good                               Pertinent Vitals/Pain Pain Assessment: No/denies pain    Home Living Family/patient expects to be discharged to:: Private residence Living Arrangements: Spouse/significant other Available Help at Discharge: Family;Available 24 hours/day Type of Home: House Home Access: Stairs to enter   [employer] of Steps: 2 Home Layout: One level Home Equipment: Walker - 2 wheels;Cane - single point      Prior Function     Gait / Transfers Assistance Needed: walks without assist  ADL's / Homemaking Assistance Needed: spouse helps as necessary but limited assist for ADLs now        Hand Dominance        Extremity/Trunk Assessment   Upper Extremity Assessment: Overall WFL for tasks assessed           Lower Extremity Assessment: Overall WFL for tasks assessed      Cervical / Trunk Assessment: Normal  Communication   Communication: No difficulties  Cognition Arousal/Alertness: Awake/alert Behavior During Therapy: Flat affect Overall Cognitive Status: Within Functional Limits for tasks assessed                      General Comments      Exercises        Assessment/Plan    PT Assessment Patent does not need any further PT services (pt  at baseline since CABG but would benefit from outpatient cardiac rehab)  PT Diagnosis Difficulty walking   PT Problem List    PT Treatment Interventions     PT Goals (Current goals can be found in the Care Plan section) Acute Rehab PT Goals PT Goal Formulation: All assessment and education complete, DC therapy    Frequency     Barriers to discharge        Co-evaluation               End of Session   Activity Tolerance: Patient tolerated treatment well Patient left: in chair;with call bell/phone within reach;with nursing/sitter in room;with  family/visitor present Nurse Communication: Mobility status         Time: 4536-4680 PT Time Calculation (min) (ACUTE ONLY): 12 min   Charges:   PT Evaluation $Initial PT Evaluation Tier I: 1 Procedure     PT G CodesMelford Aase 08/09/2014, 11:30 AM Elwyn Reach, [place]

## 2014-08-09 NOTE — Progress Notes (Signed)
[place] for Cefepime and Vancomycin Indication: HCAP  Allergies  Allergen Reactions  . Codeine Nausea And Vomiting  . Penicillins Hives and Swelling  . Sulfa Antibiotics Nausea And Vomiting  *Note penicillin allergy however patient has had cephalosporins since this reaction without a reaction.   Patient Measurements: Height: 5\' 3"  (160 cm) Weight: 127 lb 4.8 oz (57.743 kg) (b scale) IBW/kg (Calculated) : 52.4   Vital Signs: Temp: 97.3 F (36.3 C) (05/31 0555) Temp Source: Oral (05/31 0555) BP: 156/85 mmHg (05/31 1440) Pulse Rate: 80 (05/31 1440) Intake/Output from previous day: 05/30 0701 - 05/31 0700 In: 1120 [P.O.:580; I.V.:240; IV Piggyback:300] Out: 400 [Urine:400] Intake/Output from this shift: Total I/O In: 120 [P.O.:120] Out: -   Labs:  Recent Labs  08/07/14 1344 08/08/14 0312 08/09/14 0504  WBC 7.7 8.1 7.3  HGB 13.3 12.9 12.8  PLT 292 288 279  CREATININE 0.92 1.04* 1.28*   Estimated Creatinine Clearance: 33.3 mL/min (by C-G formula based on Cr of 1.28). No results for input(s): VANCOTROUGH, VANCOPEAK, VANCORANDOM, GENTTROUGH, GENTPEAK, GENTRANDOM, TOBRATROUGH, TOBRAPEAK, TOBRARND, AMIKACINPEAK, AMIKACINTROU, AMIKACIN in the last 72 hours.   Microbiology: Recent Results (from the past 720 hour(s))  Clostridium Difficile by PCR     Status: None   Collection Time: 07/16/14  9:16 AM  Result Value Ref Range Status   C difficile by pcr NEGATIVE NEGATIVE Final  Culture, blood (routine x 2)     Status: None (Preliminary result)   Collection Time: 08/07/14  5:35 PM  Result Value Ref Range Status   Specimen Description BLOOD RIGHT ARM  Final   Special Requests BOTTLES DRAWN AEROBIC AND ANAEROBIC 5CC  Final   Culture   Final           BLOOD CULTURE RECEIVED NO GROWTH TO DATE CULTURE WILL BE HELD FOR 5 DAYS BEFORE ISSUING A FINAL NEGATIVE REPORT Performed at [facility]    Report Status PENDING  Incomplete   Culture, blood (routine x 2)     Status: None (Preliminary result)   Collection Time: 08/07/14  5:46 PM  Result Value Ref Range Status   Specimen Description BLOOD RIGHT WRIST  Final   Special Requests BOTTLES DRAWN AEROBIC AND ANAEROBIC 5CC  Final   Culture   Final           BLOOD CULTURE RECEIVED NO GROWTH TO DATE CULTURE WILL BE HELD FOR 5 DAYS BEFORE ISSUING A FINAL NEGATIVE REPORT Performed at [facility]    Report Status PENDING  Incomplete  Body fluid culture     Status: None (Preliminary result)   Collection Time: 08/08/14 10:37 AM  Result Value Ref Range Status   Specimen Description FLUID PLEURAL LEFT  Final   Special Requests NONE  Final   Gram Stain   Final    NO WBC SEEN NO ORGANISMS SEEN Performed at [facility]    Culture   Final    NO GROWTH 1 DAY Performed at [facility]    Report Status PENDING  Incomplete     Medications:  Anti-infectives    Start     Dose/Rate Route Frequency Ordered Stop   08/09/14 1800  levofloxacin (LEVAQUIN) IVPB 750 mg     750 mg 100 mL/hr over 90 Minutes Intravenous Every 48 hours 08/09/14 1449     08/08/14 0600  vancomycin (VANCOCIN) 500 mg in sodium chloride 0.9 % 100 mL IVPB  Status:  Discontinued  500 mg 100 mL/hr over 60 Minutes Intravenous Every 12 hours 08/07/14 1747 08/09/14 1444   08/07/14 2200  ceFEPIme (MAXIPIME) 1 g in dextrose 5 % 50 mL IVPB  Status:  Discontinued     1 g 100 mL/hr over 30 Minutes Intravenous Every 12 hours 08/07/14 1747 08/09/14 1444   08/07/14 1745  vancomycin (VANCOCIN) IVPB 750 mg/150 ml premix     750 mg 150 mL/hr over 60 Minutes Intravenous  Once 08/07/14 1747 08/07/14 2052   08/07/14 1730  vancomycin (VANCOCIN) 15 mg/kg in sodium chloride 0.9 % 100 mL IVPB  Status:  Discontinued     15 mg/kg 100 mL/hr over 60 Minutes Intravenous  Once 08/07/14 1726 08/07/14 1744     Assessment: 69 year old female in ED with SOB and R-sided pain and CT with consolidation  concerning for HCAP.   Infectious Disease:hcap; afeb, wbc normal at 8, abx changed to levaquin monotherapy  5/29 vanc>>5/31 5/29 cefepime>>5/31 5/30 pleural fluid - ng 5/29 bld x2 - ngtd  Plan:  Levaquin 750mg  IV q48 hours - give dose this afternoon  Monitor renal function, clinical status, and culture results.   Erin Hearing PharmD., BCPS Clinical Pharmacist Pager [phone] 08/09/2014 2:54 PM

## 2014-08-09 NOTE — Progress Notes (Signed)
Maria Fox:[id number] DOB: 07-11-1945 DOA: 08/07/2014 PCP: [name], MD  Brief narrative:  21 ? admit 4.29-5.9.16 -diagnosis at time of d/c was non specific n/v-as her sympotms of GI dysfunction had improved, it was felt she could f/u as OP for GI work-up if prn She underwent CABG x 3 [place] 06/2014 -At that OSH, stay she was d/c home on midodrine as it was felt she had Orthostatic hypotension probably related to Diabetic autonomic insufficiency and Endocrinology made recommendations for work-up [see d/c summary 07/18/14] At 07/2014 hospital admission MRI showed subacute CVA She also had HCAP and was Rx Vacn and Cefepime-which double covered for a K PNA Pyelo She saw her PCP Dr. Lynnae Sandhoff and was profoundly weak still but somewhat improved. She [facility] to [facility] on 5/30 c R sided HA, SOB.  CTA on admission showed Pleural effusion.  BNP on admit was 297 Empirically started on broad spect Abx for presumed Failed HCAP [place] thora performed 5/30 revealing exudate-cultures pending She had LUQ pain and as she had mod ? LFt's, Hepatitis panel=Hep C +  Past medical history-As per Problem list Chart reviewed as below-  Consultants:  none  Procedures:  [place] thora 5/30! 300 cc  Antibiotics:  Vancomycin 5/29-5/31  Cefepime 5/29-5/31   Subjective   nauseous today after a good long walk with therapy Didn't feel like eating No f/chills/rigor No cp   Objective    Interim History:   Telemetry:    Objective: Filed Vitals:   08/08/14 2248 08/09/14 0554 08/09/14 0555 08/09/14 1440  BP: 141/71  112/70 156/85  Pulse: 80  90 80  Temp:   97.3 F (36.3 C)   TempSrc:   Oral   Resp: 18  18 18   Height:      Weight:  57.743 kg (127 lb 4.8 oz)    SpO2: 96%  96% 99%    Intake/Output Summary (Last 24 hours) at 08/09/14 1709 Last data filed at 08/09/14 1001  Gross per 24 hour  Intake    610 ml  Output      0 ml  Net    610 ml    Exam:  General: eomi ncat Cardiovascular:  s1 s 2no m/r/g Respiratory: clear no added sound no m/r/g Abdomen: soft--RUQ slightly tender-cannot appreciate HJ reflex, noSplenomegally Skin no le edema Neuro intact no asteriixis  Data Reviewed: Basic Metabolic Panel:  Recent Labs Lab 08/07/14 1344 08/08/14 0312 08/09/14 0504  NA 138 136 133*  K 3.8 4.1 4.6  CL 104 103 102  CO2 22 22 23   GLUCOSE 172* 187* 159*  BUN 11 11 20   CREATININE 0.92 1.04* 1.28*  CALCIUM 9.5 9.6 9.4  MG  --   --  1.8   Liver Function Tests:  Recent Labs Lab 08/07/14 1344 08/09/14 0504  AST 22 22  ALT 14 14  ALKPHOS 82 72  BILITOT 0.6 0.9  PROT 8.0 7.3  ALBUMIN 3.8 3.5    Recent Labs Lab 08/07/14 1433  LIPASE 34   No results for input(s): AMMONIA in the last 168 hours. CBC:  Recent Labs Lab 08/07/14 1344 08/08/14 0312 08/09/14 0504  WBC 7.7 8.1 7.3  NEUTROABS 4.5  --  3.9  HGB 13.3 12.9 12.8  HCT 38.7 37.5 37.8  MCV 86.2 86.6 87.3  PLT 292 288 279   Cardiac Enzymes: No results for input(s): CKTOTAL, CKMB, CKMBINDEX, TROPONINI in the last 168 hours. BNP: Invalid input(s): POCBNP CBG:  Recent Labs Lab 08/08/14  1656 08/08/14 2243 08/09/14 0557 08/09/14 1051 08/09/14 1627  GLUCAP 259* 150* 161* 234* 182*    Recent Results (from the past 240 hour(s))  Culture, blood (routine x 2)     Status: None (Preliminary result)   Collection Time: 08/07/14  5:35 PM  Result Value Ref Range Status   Specimen Description BLOOD RIGHT ARM  Final   Special Requests BOTTLES DRAWN AEROBIC AND ANAEROBIC 5CC  Final   Culture   Final           BLOOD CULTURE RECEIVED NO GROWTH TO DATE CULTURE WILL BE HELD FOR 5 DAYS BEFORE ISSUING A FINAL NEGATIVE REPORT Performed at [facility]    Report Status PENDING  Incomplete  Culture, blood (routine x 2)     Status: None (Preliminary result)   Collection Time: 08/07/14  5:46 PM  Result Value Ref Range Status   Specimen Description BLOOD RIGHT WRIST  Final   Special Requests BOTTLES  DRAWN AEROBIC AND ANAEROBIC 5CC  Final   Culture   Final           BLOOD CULTURE RECEIVED NO GROWTH TO DATE CULTURE WILL BE HELD FOR 5 DAYS BEFORE ISSUING A FINAL NEGATIVE REPORT Performed at [facility]    Report Status PENDING  Incomplete  Body fluid culture     Status: None (Preliminary result)   Collection Time: 08/08/14 10:37 AM  Result Value Ref Range Status   Specimen Description FLUID PLEURAL LEFT  Final   Special Requests NONE  Final   Gram Stain   Final    NO WBC SEEN NO ORGANISMS SEEN Performed at [facility]    Culture   Final    NO GROWTH 1 DAY Performed at [facility]    Report Status PENDING  Incomplete     Studies:              All Imaging reviewed and is as per above notation   Scheduled Meds: . aspirin EC  81 mg Oral Daily  . atorvastatin  20 mg Oral QHS  . carvedilol  3.125 mg Oral BID WC  . clopidogrel  75 mg Oral Daily  . enoxaparin (LOVENOX) injection  40 mg Subcutaneous Q24H  . famotidine  20 mg Oral QHS  . feeding supplement (ENSURE ENLIVE)  237 mL Oral BID BM  . feeding supplement (GLUCERNA SHAKE)  237 mL Oral TID BM  . ferrous sulfate  325 mg Oral Daily  . folic acid  1 mg Oral Daily  . furosemide  40 mg Intravenous Daily  . gabapentin  100 mg Oral TID  . insulin aspart  0-9 Units Subcutaneous TID WC  . levofloxacin (LEVAQUIN) IV  750 mg Intravenous Q48H  . linagliptin  5 mg Oral Daily  . midodrine  10 mg Oral TID WC  . polyethylene glycol  17 g Oral Daily  . potassium chloride  40 mEq Oral Daily  . sertraline  50 mg Oral Daily  . sodium chloride  3 mL Intravenous Q12H   Continuous Infusions:    Assessment/Plan:   1. Sepsis 2/2 to Failed HCAP + Parapneumonic effusion-TPeff:TPserum=5.7:8.0=0.71-->exudative effusion.  narrowed broad spectrum abx-->PO levaquin-duration ~ 14 days total needed.  Gram stain ADDED ON.  Culture pending-CXR rpt=signif ?L pelural fluid.  Rpt 2 Vw CXr shows resolved effusion 2. Recent  CABG x3 [facility]-Stable currently-continue Plavix 75, ASA 81-previously cannot tolerat either ACe/BB 2/2 to hypotension but on admission was tachycardic probably 2/2  to sepsis from #1--tolerating coreg3.125 bid--continue.  Consider ACE in the next 1-2 days 3. Decompensated acute diastolic HF-increase lasix 20-e40 mg daily.  Monitor renal trends 4. Autonomic dysfuction 2/2 to DM [POTS]-continue Midodrine 10 tid.  OP follow up GI as most issues are stable 5. DM ty II K8D 8.2 07/9468 complicated by DM gastroparesis + neuropathy-sugars 149--259-continue linagliptin 5 + ss1.  Sugars 200 range. 6. Presumed OLD cva's-see above 7. Anemia, borderline microcytis-continue FeSo4 +folate 8. RUQ abd pain-cirrhosis-Hep C is +.  HCV RNA Ultra + Genotype ordered 5/31 and pending.  Needs [facility] follow up consideration Simponi/Harvoni  We will ask GI to see her as an OP. CT abd 2010 suggestive early cirrhosis 9. NSVT-keep on tele-on coreg.  Am Magensium level 10. Vitiligo at [facility] 11. Moderate P-EM-lost 3 punds since last admit 5/9.  Supplement prn  Code Status: Full Family Communication: d/w husband bedside Disposition Plan: inpatient ~48 hrs-resolution n/v-ensure no fever on po Abx and follow up on Plueral cult   Verneita Griffes, MD  [facility] Pager [phone] 08/09/2014, 5:09 PM    LOS: 2 days

## 2014-08-10 DIAGNOSIS — I2510 Atherosclerotic heart disease of native coronary artery without angina pectoris: Secondary | ICD-10-CM

## 2014-08-10 DIAGNOSIS — J90 Pleural effusion, not elsewhere classified: Secondary | ICD-10-CM | POA: Insufficient documentation

## 2014-08-10 DIAGNOSIS — I472 Ventricular tachycardia: Secondary | ICD-10-CM

## 2014-08-10 DIAGNOSIS — E1141 Type 2 diabetes mellitus with diabetic mononeuropathy: Secondary | ICD-10-CM

## 2014-08-10 DIAGNOSIS — E440 Moderate protein-calorie malnutrition: Secondary | ICD-10-CM | POA: Diagnosis present

## 2014-08-10 LAB — BASIC METABOLIC PANEL
ANION GAP: 11 (ref 5–15)
BUN: 20 mg/dL (ref 6–20)
CHLORIDE: 101 mmol/L (ref 101–111)
CO2: 25 mmol/L (ref 22–32)
Calcium: 10.1 mg/dL (ref 8.9–10.3)
Creatinine, Ser: 1.41 mg/dL — ABNORMAL HIGH (ref 0.44–1.00)
GFR calc Af Amer: 42 mL/min — ABNORMAL LOW (ref >60)
GFR, EST NON AFRICAN AMERICAN: 37 mL/min — AB (ref >60)
GLUCOSE: 163 mg/dL — AB (ref 65–99)
Potassium: 4.9 mmol/L (ref 3.5–5.1)
SODIUM: 137 mmol/L (ref 135–145)

## 2014-08-10 LAB — HCV RNA QUANT RFLX ULTRA OR GENOTYP
HCV RNA Qnt(log copy/mL): UNDETERMINED log10 IU/mL
HEPATITIS C QUANTITATION: NOT DETECTED [IU]/mL

## 2014-08-10 LAB — GLUCOSE, CAPILLARY
GLUCOSE-CAPILLARY: 100 mg/dL — AB (ref 65–99)
Glucose-Capillary: 164 mg/dL — ABNORMAL HIGH (ref 65–99)
Glucose-Capillary: 217 mg/dL — ABNORMAL HIGH (ref 65–99)
Glucose-Capillary: 153 mg/dL — ABNORMAL HIGH (ref 65–99)

## 2014-08-10 LAB — PATHOLOGIST SMEAR REVIEW

## 2014-08-10 LAB — MAGNESIUM: Magnesium: 2 mg/dL (ref 1.7–2.4)

## 2014-08-10 MED ORDER — ENOXAPARIN SODIUM 30 MG/0.3ML ~~LOC~~ SOLN
30.0000 mg | Freq: Every day | SUBCUTANEOUS | Status: DC
  Administered 2014-08-10 – 2014-08-21 (×12): 30 mg via SUBCUTANEOUS
  Filled 2014-08-10 (×13): qty 0.3

## 2014-08-10 MED ORDER — CARVEDILOL 6.25 MG PO TABS
6.2500 mg | ORAL_TABLET | Freq: Two times a day (BID) | ORAL | Status: AC
  Administered 2014-08-11 – 2014-08-13 (×6): 6.25 mg via ORAL
  Filled 2014-08-10 (×7): qty 1

## 2014-08-10 MED ORDER — SENNA 8.6 MG PO TABS
2.0000 | ORAL_TABLET | Freq: Every day | ORAL | Status: DC
  Administered 2014-08-11 – 2014-08-21 (×10): 17.2 mg via ORAL
  Filled 2014-08-10 (×13): qty 2

## 2014-08-10 MED ORDER — CARVEDILOL 3.125 MG PO TABS
3.1250 mg | ORAL_TABLET | Freq: Once | ORAL | Status: AC
  Administered 2014-08-10: 3.125 mg via ORAL
  Filled 2014-08-10: qty 1

## 2014-08-10 MED ORDER — SODIUM CHLORIDE 0.9 % IV BOLUS (SEPSIS): 500.0000 mL | Freq: Once | INTRAVENOUS | Status: DC

## 2014-08-10 MED ORDER — SODIUM CHLORIDE 0.9 % IV BOLUS (SEPSIS)
250.0000 mL | Freq: Once | INTRAVENOUS | Status: AC
  Administered 2014-08-10: 250 mL via INTRAVENOUS

## 2014-08-10 MED ORDER — PROCHLORPERAZINE EDISYLATE 5 MG/ML IJ SOLN
10.0000 mg | Freq: Once | INTRAMUSCULAR | Status: AC
  Administered 2014-08-10: 10 mg via INTRAVENOUS
  Filled 2014-08-10: qty 2

## 2014-08-10 MED ORDER — POLYETHYLENE GLYCOL 3350 17 G PO PACK
17.0000 g | PACK | Freq: Two times a day (BID) | ORAL | Status: DC
  Administered 2014-08-10 – 2014-08-22 (×12): 17 g via ORAL
  Filled 2014-08-10 (×26): qty 1

## 2014-08-10 MED ORDER — LEVOFLOXACIN 750 MG PO TABS
750.0000 mg | ORAL_TABLET | ORAL | Status: DC
  Administered 2014-08-11 – 2014-08-15 (×3): 750 mg via ORAL
  Filled 2014-08-10 (×3): qty 1

## 2014-08-10 NOTE — Consult Note (Signed)
   [facility] CM Inpatient Consult   08/10/2014  Maria Fox 1945/07/08 [id number] Referral received. Patient evaluated for community based chronic disease management services with [facility] as a benefit of patient's [employer]. Spoke with patient at bedside to explain [facility] services. Patient states she would appreciate follow up since being in hospitals in the last few months, which included a hospital stay in [place] for her CABG.   Patient will receive post discharge transition of care call and will be evaluated for monthly home visits for assessments and disease process education. Consent form signed and contact information and [facility] literature at bedside. Made Inpatient Case Manager aware that [place] [facility] following. Of note, [facility] Care Management services does not replace or interfere with any services that are arranged by inpatient case management or social work.  For additional questions or referrals please contact:   Natividad Brood, RN BSN [facility] Liaison  [phone] business mobile phone

## 2014-08-10 NOTE — Progress Notes (Signed)
Patient in bed resting and is waiting for husband to return. At this time complains of no pain or nausea. Will continue to monitor patient to end of shift.

## 2014-08-10 NOTE — Progress Notes (Signed)
PT Note  Patient Details  Name: Maria Fox MRN: [id number] DOB: 04-02-1945   Cancelled Treatment:     Pt was evaluated by Marquis Buggy, PT on 08/09/14 and was able to ambulate 400' without difficulty and no balance issues identified. No acute care PT needs identified.  If pt has a decline in function while in the hospital please order otherwise recommend pt continue to ambulate while in the hospital to prevent hospital acquired deconditioning.   Melvern Banker 08/10/2014, 10:20 AM  Lavonia Dana, PT  [phone] 08/10/2014

## 2014-08-10 NOTE — Progress Notes (Signed)
Paged Dr. [place] Silos regarding 15 bts VTACH over 7.81 sec. MD made aware. No new orders received. Will continue to monitor pt.       Maurene Capes RN

## 2014-08-10 NOTE — Consult Note (Addendum)
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  Echocardiogram  Study Conclusions  - Left ventricle: The cavity size was normal. There was mild concentric hypertrophy. Systolic function was vigorous. The estimated ejection fraction was in the range of 65% to 70%. Doppler parameters are consistent with abnormal left ventricular relaxation (grade 1 diastolic dysfunction).    ASSESSMENT AND PLAN  Active Problems:   Pleural effusion on left   HCAP (healthcare-associated pneumonia)   Malnutrition of moderate degree    1. NSVT/ Sinus Tachycardia: 15 beat run lasting <30 sec. 1 episode captured on tele. Also with recurrent sinus tachycardia. K and Mg both WNL. Hgb is normal. Recent 2D echo 5/30 showed normal LV systolic function and no structural abnormalities. Admission EKG showed QTc of 514 ms. Will recheck. Will also check TSH. Check orthostatics. Recommend continuation of BB  therapy. Recommend increasing dose for better rate control as BP allows. She may also benefit from change to metoprolol for more HR reduction (normal EF on echo). Also, continue to treat underlying medical illnesses. Continue to monitor on  telemetry.   2. CAD: h/o recent NSTEMI followed by CABG at OSH in [place], [place] 06/2014. Denies any recurrent angina. Continue medical therapy, ASA, Plavix, BB and statin. No ACE/ARB due to h/o angioedema. Patient notes that she is scheduled for new patient appointment with Dr. Radford Pax in July.   3. HCAP: management per primary team.   4. Elevated D-dimer: CT on admit negative for PE.    Signed, Lyda Jester, PA-C 08/10/2014, 2:15 PM  I have examined the patient and reviewed assessment and plan and discussed with patient.  Agree with above as stated.  Increase carvedilol to 6.25 mg twice a day. Continue to watch on telemetry. Hopefully, this will suppress her nonsustained ventricular tachycardia. If it persists, would consider EP consultation.  Given that she is not having the symptoms that she had before recent bypass surgery, would not plan on ischemia testing at this time. If her VT persists, may need to reconsider.  Patient previously scheduled to see Dr. Radford Pax in a month.     Jolon Degante S.

## 2014-08-10 NOTE — Progress Notes (Signed)
[facility] PROGRESS NOTE  Maria Fox [id number]:[id number] DOB: 02-01-1946 DOA: 08/07/2014 PCP: [name], MD  Assessment/Plan: #1 sepsis secondary to healthcare associated pneumonia and probable parapneumonic effusion Clinical improvement. Per light's criteria effusion exudative in nature.Chest x-ray with improvement. Blood cultures pending. Body fluid cultures pending. Patient has been transitioned from IV antibodies to oral Levaquin to complete a two-week course of on a biotic therapy. Follow.  #2 nonsustained V. tach Patient noted to be nonsustained V. tach. Patient denied any chest pain. Magnesium at 2. Potassium is at 4.9. Will give extra dose of oral Coreg. Check a TSH. 2-D echo with EF of 60-65% with no wall motion abnormalities and grade 1 diastolic dysfunction. Will consult with cardiology for further evaluation and management. Follow.  #3 orthostatic hypotension/autonomic dysfunction secondary to diabetes Continue Midrin. Will hold Lasix tomorrow as patient noted to be orthostatic and complaining of dizziness. Bolus of normal saline. TED hose. Follow.  #4 acute diastolic heart failure Patient currently on Lasix 40 mg IV daily. Patient with orthostasis and complaining of dizziness. Slight increasing creatinine. Will hold Lasix. Will likely resume home dose Lasix in 1-2 days. Follow.  #5 type 2 diabetes Hemoglobin A1c was 8.2 on 06/2014. Patient also with a history of diabetic gastroparesis and neuropathy. Continue sliding scale insulin. Continue linagliptin.  #6 recent CABG 3 in [place] Stable. Continue aspirin, Plavix. Patient was unable to tolerate ACE inhibitor secondary to hypotension. Patient currently tolerating Coreg. Monitor for now.  #7 history of CVA Continue aspirin and Plavix for secondary stroke prevention.  #8 anemia H&H stable. Continue iron sulfate and fully.  #9 right upper quadrant abdominal pain/cirrhosis/hep C HCVRNA genotype ordered pending. Will  need to follow-up with the ID clinic as outpatient as well as GI.  #10 vitiligo  #11 moderate protein calorie malnutrition Stable.   #12 prophylaxis Pepcid for GI prophylaxis. Lovenox for DVT prophylaxis.    Code Status: Full Family Communication: updated patient. No family at bedside. Disposition Plan: Home when medically stable.   Consultants:  Cardiology: Pending  Procedures:  CT angiogram chest 08/07/2014  CT head without contrast 08/07/2014  Chest x-ray 08/07/2014, 08/08/2014 08/09/2014,  2-D echo 08/08/2014   ultrasound-guided thoracentesis 08/08/2014 300 mL removed  Antibiotics:  IV vancomycin 08/07/2014 >>>> 08/09/2014    IV cefepime 08/07/2014>>>>> 08/09/2014  Oral Levaquin 08/11/2014  HPI/Subjective: Patient complaining of nausea. Patient complaining of dizziness with positional changes. Patient with some complaints of palpitations. Per nursing patient noted to go into nonsustained V. tach.   Objective: Filed Vitals:   08/10/14 1637  BP: 129/95  Pulse: 78  Temp:   Resp:     Intake/Output Summary (Last 24 hours) at 08/10/14 1900 Last data filed at 08/10/14 1006  Gross per 24 hour  Intake    583 ml  Output      0 ml  Net    583 ml   Filed Weights   08/08/14 0536 08/09/14 0554 08/10/14 0646  Weight: 57.199 kg (126 lb 1.6 oz) 57.743 kg (127 lb 4.8 oz) 57.471 kg (126 lb 11.2 oz)    Exam:   General:  nad  Cardiovascular: RRR  Respiratory: CTAB  Abdomen: soft, nontender, nondistended, positive bowel sounds.  Musculoskeletal: no clubbing cyanosis or edema.  Data Reviewed: Basic Metabolic Panel:  Recent Labs Lab 08/07/14 1344 08/08/14 0312 08/09/14 0504 08/10/14 0926  NA 138 136 133* 137  K 3.8 4.1 4.6 4.9  CL 104 103 102 101  CO2 22 22 23  25  GLUCOSE 172* 187* 159* 163*  BUN 11 11 20 20   CREATININE 0.92 1.04* 1.28* 1.41*  CALCIUM 9.5 9.6 9.4 10.1  MG  --   --  1.8 2.0   Liver Function Tests:  Recent Labs Lab  08/07/14 1344 08/09/14 0504  AST 22 22  ALT 14 14  ALKPHOS 82 72  BILITOT 0.6 0.9  PROT 8.0 7.3  ALBUMIN 3.8 3.5    Recent Labs Lab 08/07/14 1433  LIPASE 34   No results for input(s): AMMONIA in the last 168 hours. CBC:  Recent Labs Lab 08/07/14 1344 08/08/14 0312 08/09/14 0504  WBC 7.7 8.1 7.3  NEUTROABS 4.5  --  3.9  HGB 13.3 12.9 12.8  HCT 38.7 37.5 37.8  MCV 86.2 86.6 87.3  PLT 292 288 279   Cardiac Enzymes: No results for input(s): CKTOTAL, CKMB, CKMBINDEX, TROPONINI in the last 168 hours. BNP (last 3 results)  Recent Labs  07/07/14 2210 08/07/14 1344  BNP 125.5* 297.8*    ProBNP (last 3 results) No results for input(s): PROBNP in the last 8760 hours.  CBG:  Recent Labs Lab 08/09/14 1627 08/09/14 2200 08/10/14 0642 08/10/14 1135 08/10/14 1627  GLUCAP 182* 131* 164* 217* 153*    Recent Results (from the past 240 hour(s))  Culture, blood (routine x 2)     Status: None (Preliminary result)   Collection Time: 08/07/14  5:35 PM  Result Value Ref Range Status   Specimen Description BLOOD RIGHT ARM  Final   Special Requests BOTTLES DRAWN AEROBIC AND ANAEROBIC 5CC  Final   Culture   Final           BLOOD CULTURE RECEIVED NO GROWTH TO DATE CULTURE WILL BE HELD FOR 5 DAYS BEFORE ISSUING A FINAL NEGATIVE REPORT Performed at [facility]    Report Status PENDING  Incomplete  Culture, blood (routine x 2)     Status: None (Preliminary result)   Collection Time: 08/07/14  5:46 PM  Result Value Ref Range Status   Specimen Description BLOOD RIGHT WRIST  Final   Special Requests BOTTLES DRAWN AEROBIC AND ANAEROBIC 5CC  Final   Culture   Final           BLOOD CULTURE RECEIVED NO GROWTH TO DATE CULTURE WILL BE HELD FOR 5 DAYS BEFORE ISSUING A FINAL NEGATIVE REPORT Performed at [facility]    Report Status PENDING  Incomplete  Body fluid culture     Status: None (Preliminary result)   Collection Time: 08/08/14 10:37 AM  Result Value Ref  Range Status   Specimen Description FLUID PLEURAL LEFT  Final   Special Requests NONE  Final   Gram Stain   Final    NO WBC SEEN NO ORGANISMS SEEN Performed at [facility]    Culture   Final    NO GROWTH 2 DAYS Performed at [facility]    Report Status PENDING  Incomplete     Studies: Dg Chest 2 View  08/09/2014   CLINICAL DATA:  Shortness of breath, weakness, pneumonia.  EXAM: CHEST  2 VIEW  COMPARISON:  Chest x-ray of Aug 08, 2014  FINDINGS: The lungs are adequately inflated. A small left pleural effusion persists. No pneumothorax is evident. Infiltrate or atelectasis at the left lung base is improving. The heart and pulmonary vascularity are normal. There is 6 intact sternal wires. The trachea is midline. The bony thorax is unremarkable.  IMPRESSION: Improving appearance of the left lower lobe  pneumonia and small left pleural effusion. Elsewhere the chest reveals no acute abnormality.   Electronically Signed   By: David  Martinique M.D.   On: 08/09/2014 09:16    Scheduled Meds: . aspirin EC  81 mg Oral Daily  . atorvastatin  20 mg Oral QHS  . carvedilol  3.125 mg Oral BID WC  . clopidogrel  75 mg Oral Daily  . enoxaparin (LOVENOX) injection  30 mg Subcutaneous QHS  . famotidine  20 mg Oral QHS  . feeding supplement (ENSURE ENLIVE)  237 mL Oral BID BM  . ferrous sulfate  325 mg Oral Daily  . folic acid  1 mg Oral Daily  . furosemide  40 mg Intravenous Daily  . gabapentin  100 mg Oral TID  . insulin aspart  0-9 Units Subcutaneous TID WC  . [START ON 08/11/2014] levofloxacin  750 mg Oral Q48H  . linagliptin  5 mg Oral Daily  . midodrine  10 mg Oral TID WC  . polyethylene glycol  17 g Oral BID  . potassium chloride  40 mEq Oral Daily  . senna  2 tablet Oral QHS  . sertraline  50 mg Oral Daily  . sodium chloride  500 mL Intravenous Once  . sodium chloride  3 mL Intravenous Q12H   Continuous Infusions:   Principal Problem:   HCAP (healthcare-associated  pneumonia) Active Problems:   MDD (major depressive disorder), recurrent episode, moderate   HYPERTENSION, BENIGN SYSTEMIC   S/P CABG x 3, April 16   Autonomic orthostatic hypotension   Protein-calorie malnutrition   Pleural effusion on left   Malnutrition of moderate degree    Time spent: 35 minutes    [facility] MD [facility] Pager [phone]. If 7PM-7AM, please contact night-coverage at [email or web address], password [facility] 08/10/2014, 7:00 PM  LOS: 3 days

## 2014-08-11 DIAGNOSIS — I10 Essential (primary) hypertension: Secondary | ICD-10-CM

## 2014-08-11 DIAGNOSIS — J189 Pneumonia, unspecified organism: Secondary | ICD-10-CM

## 2014-08-11 DIAGNOSIS — Z951 Presence of aortocoronary bypass graft: Secondary | ICD-10-CM

## 2014-08-11 DIAGNOSIS — I951 Orthostatic hypotension: Secondary | ICD-10-CM

## 2014-08-11 LAB — CBC
HEMATOCRIT: 40 % (ref 36.0–46.0)
Hemoglobin: 13.4 g/dL (ref 12.0–15.0)
MCH: 29.6 pg (ref 26.0–34.0)
MCHC: 33.5 g/dL (ref 30.0–36.0)
MCV: 88.5 fL (ref 78.0–100.0)
PLATELETS: 299 10*3/uL (ref 150–400)
RBC: 4.52 MIL/uL (ref 3.87–5.11)
RDW: 13.5 % (ref 11.5–15.5)
WBC: 10 10*3/uL (ref 4.0–10.5)

## 2014-08-11 LAB — TSH: TSH: 3.353 u[IU]/mL (ref 0.350–4.500)

## 2014-08-11 LAB — GLUCOSE, CAPILLARY
GLUCOSE-CAPILLARY: 104 mg/dL — AB (ref 65–99)
Glucose-Capillary: 142 mg/dL — ABNORMAL HIGH (ref 65–99)
Glucose-Capillary: 199 mg/dL — ABNORMAL HIGH (ref 65–99)
Glucose-Capillary: 82 mg/dL (ref 65–99)

## 2014-08-11 LAB — BASIC METABOLIC PANEL
Anion gap: 12 (ref 5–15)
BUN: 26 mg/dL — AB (ref 6–20)
CHLORIDE: 100 mmol/L — AB (ref 101–111)
CO2: 23 mmol/L (ref 22–32)
CREATININE: 1.96 mg/dL — AB (ref 0.44–1.00)
Calcium: 9.7 mg/dL (ref 8.9–10.3)
GFR calc Af Amer: 28 mL/min — ABNORMAL LOW (ref >60)
GFR calc non Af Amer: 25 mL/min — ABNORMAL LOW (ref >60)
Glucose, Bld: 126 mg/dL — ABNORMAL HIGH (ref 65–99)
Potassium: 4.6 mmol/L (ref 3.5–5.1)
Sodium: 135 mmol/L (ref 135–145)

## 2014-08-11 LAB — BODY FLUID CULTURE
Culture: NO GROWTH
GRAM STAIN: NONE SEEN

## 2014-08-11 MED ORDER — SODIUM CHLORIDE 0.9 % IV SOLN
INTRAVENOUS | Status: DC
  Administered 2014-08-11 (×2): via INTRAVENOUS

## 2014-08-11 NOTE — Progress Notes (Signed)
PT Cancellation Note/ Discharge  Patient Details Name: Maria Fox MRN: [id number] DOB: 09/21/1945   Cancelled Treatment:    Reason Eval/Treat Not Completed: Other (comment) (Pt evaluated and discharged 5/31 at her baseline with recommendation for outpatient cardiac rehab. 2 additional orders since that time. Pt with no change in function or current acute needs. REcommend daily ambulation with nursing supervision)   Melford Aase 08/11/2014, 10:27 AM Elwyn Reach, [place]

## 2014-08-11 NOTE — Progress Notes (Signed)
OT Cancellation Note  Patient Details Name: Maria Fox MRN: [id number] DOB: 1945/12/06   Cancelled Treatment:    Reason Eval/Treat Not Completed: OT screened, no needs identified, will sign off. Pt performing self care and mobility at her baseline.  Educated pt in importance of ambulation with staff, verbalized understanding.  [name] 08/11/2014, 12:22 PM

## 2014-08-11 NOTE — Progress Notes (Signed)
[facility] PROGRESS NOTE  Maria Fox OYD:[id number] DOB: [birth year] DOA: 08/07/2014 PCP: [name], MD  Assessment/Plan: #1 sepsis secondary to healthcare associated pneumonia and probable parapneumonic effusion Clinical improvement. Per light's criteria effusion exudative in nature.Chest x-ray with improvement. Blood cultures pending. Body fluid cultures pending. Patient has been transitioned from IV antibodies to oral Levaquin to complete a two-week course of antibiotic therapy. Follow.  #2 nonsustained V. tach Patient noted to be nonsustained V. tach. Patient denied any chest pain. Magnesium at 2. Potassium is at 4.6. Coreg dose was increased to 6.25 mg twice a day. TSH within normal limits at 3.353. 2-D echo with EF of 60-65% with no wall motion abnormalities and grade 1 diastolic dysfunction. Patient has been seen in consultation by cardiology and no further workup needed at this time. Follow.   #3 orthostatic hypotension/autonomic dysfunction secondary to diabetes Continue Midrin. Continue to hold Lasix. Continue TED hose. Gentle hydration.   #4 acute diastolic heart failure Patient was on Lasix 40 mg IV daily. Patient with orthostasis and complained of dizziness and a such direct were discontinued. Increasing creatinine. Place on gentle hydration and follow.   #5 type 2 diabetes Hemoglobin A1c was 8.2 on 06/2014. Patient also with a history of diabetic gastroparesis and neuropathy. Continue sliding scale insulin. Continue linagliptin.  #6 recent CABG 3 in [place] Stable. Continue aspirin, Plavix. Patient was unable to tolerate ACE inhibitor secondary to hypotension. Patient currently tolerating Coreg. Monitor for now.  #7 history of CVA Continue aspirin and Plavix for secondary stroke prevention.  #8 anemia H&H stable. Continue iron sulfate.  #9 right upper quadrant abdominal pain/cirrhosis/hep C HCVRNA genotype ordered pending. Will need to follow-up with the ID  clinic as outpatient as well as GI.  #10 vitiligo  #11 moderate protein calorie malnutrition Stable.   #12 prophylaxis Pepcid for GI prophylaxis. Lovenox for DVT prophylaxis.    Code Status: Full Family Communication: updated patient. No family at bedside. Disposition Plan: Home when medically stable.   Consultants:  Cardiology: Dr Irish Lack  08/10/14   Procedures:  CT angiogram chest 08/07/2014  CT head without contrast 08/07/2014  Chest x-ray 08/07/2014, 08/08/2014 08/09/2014,  2-D echo 08/08/2014   ultrasound-guided thoracentesis 08/08/2014 300 mL removed  Antibiotics:  IV vancomycin 08/07/2014 >>>> 08/09/2014    IV cefepime 08/07/2014>>>>> 08/09/2014  Oral Levaquin 08/11/2014  HPI/Subjective: Patient complaining of nausea. Patient complaining of dizziness with positional changes. Patient with some complaints of palpitations. No further runs of nonsustained V. tach.  Objective: Filed Vitals:   08/11/14 1000  BP: 95/63  Pulse: 114  Temp: 97.6 F (36.4 C)  Resp:     Intake/Output Summary (Last 24 hours) at 08/11/14 1155 Last data filed at 08/11/14 0952  Gross per 24 hour  Intake      0 ml  Output    800 ml  Net   -800 ml   Filed Weights   08/09/14 0554 08/10/14 0646 08/11/14 0602  Weight: 57.743 kg (127 lb 4.8 oz) 57.471 kg (126 lb 11.2 oz) 56.473 kg (124 lb 8 oz)    Exam:   General:  nad  Cardiovascular: RRR  Respiratory: CTAB  Abdomen: soft, nontender, nondistended, positive bowel sounds.  Musculoskeletal: no clubbing cyanosis or edema.  Data Reviewed: Basic Metabolic Panel:  Recent Labs Lab 08/07/14 1344 08/08/14 0312 08/09/14 0504 08/10/14 0926 08/11/14 0433  NA 138 136 133* 137 135  K 3.8 4.1 4.6 4.9 4.6  CL 104 103 102 101 100*  CO2 22 22 23 25 23   GLUCOSE 172* 187* 159* 163* 126*  BUN 11 11 20 20  26*  CREATININE 0.92 1.04* 1.28* 1.41* 1.96*  CALCIUM 9.5 9.6 9.4 10.1 9.7  MG  --   --  1.8 2.0  --    Liver Function  Tests:  Recent Labs Lab 08/07/14 1344 08/09/14 0504  AST 22 22  ALT 14 14  ALKPHOS 82 72  BILITOT 0.6 0.9  PROT 8.0 7.3  ALBUMIN 3.8 3.5    Recent Labs Lab 08/07/14 1433  LIPASE 34   No results for input(s): AMMONIA in the last 168 hours. CBC:  Recent Labs Lab 08/07/14 1344 08/08/14 0312 08/09/14 0504 08/11/14 0433  WBC 7.7 8.1 7.3 10.0  NEUTROABS 4.5  --  3.9  --   HGB 13.3 12.9 12.8 13.4  HCT 38.7 37.5 37.8 40.0  MCV 86.2 86.6 87.3 88.5  PLT 292 288 279 299   Cardiac Enzymes: No results for input(s): CKTOTAL, CKMB, CKMBINDEX, TROPONINI in the last 168 hours. BNP (last 3 results)  Recent Labs  07/07/14 2210 08/07/14 1344  BNP 125.5* 297.8*    ProBNP (last 3 results) No results for input(s): PROBNP in the last 8760 hours.  CBG:  Recent Labs Lab 08/10/14 1135 08/10/14 1627 08/10/14 2107 08/11/14 0613 08/11/14 1122  GLUCAP 217* 153* 100* 142* 199*    Recent Results (from the past 240 hour(s))  Culture, blood (routine x 2)     Status: None (Preliminary result)   Collection Time: 08/07/14  5:35 PM  Result Value Ref Range Status   Specimen Description BLOOD RIGHT ARM  Final   Special Requests BOTTLES DRAWN AEROBIC AND ANAEROBIC 5CC  Final   Culture   Final           BLOOD CULTURE RECEIVED NO GROWTH TO DATE CULTURE WILL BE HELD FOR 5 DAYS BEFORE ISSUING A FINAL NEGATIVE REPORT Performed at [facility]    Report Status PENDING  Incomplete  Culture, blood (routine x 2)     Status: None (Preliminary result)   Collection Time: 08/07/14  5:46 PM  Result Value Ref Range Status   Specimen Description BLOOD RIGHT WRIST  Final   Special Requests BOTTLES DRAWN AEROBIC AND ANAEROBIC 5CC  Final   Culture   Final           BLOOD CULTURE RECEIVED NO GROWTH TO DATE CULTURE WILL BE HELD FOR 5 DAYS BEFORE ISSUING A FINAL NEGATIVE REPORT Performed at [facility]    Report Status PENDING  Incomplete  Body fluid culture     Status: None    Collection Time: 08/08/14 10:37 AM  Result Value Ref Range Status   Specimen Description FLUID PLEURAL LEFT  Final   Special Requests NONE  Final   Gram Stain   Final    NO WBC SEEN NO ORGANISMS SEEN Performed at [facility]    Culture   Final    NO GROWTH 3 DAYS Performed at [facility]    Report Status 08/11/2014 FINAL  Final     Studies: No results found.  Scheduled Meds: . aspirin EC  81 mg Oral Daily  . atorvastatin  20 mg Oral QHS  . carvedilol  6.25 mg Oral BID WC  . clopidogrel  75 mg Oral Daily  . enoxaparin (LOVENOX) injection  30 mg Subcutaneous QHS  . famotidine  20 mg Oral QHS  . feeding supplement (ENSURE ENLIVE)  237 mL  Oral BID BM  . ferrous sulfate  325 mg Oral Daily  . folic acid  1 mg Oral Daily  . gabapentin  100 mg Oral TID  . insulin aspart  0-9 Units Subcutaneous TID WC  . levofloxacin  750 mg Oral Q48H  . linagliptin  5 mg Oral Daily  . midodrine  10 mg Oral TID WC  . polyethylene glycol  17 g Oral BID  . potassium chloride  40 mEq Oral Daily  . senna  2 tablet Oral QHS  . sertraline  50 mg Oral Daily  . sodium chloride  3 mL Intravenous Q12H   Continuous Infusions: . sodium chloride 75 mL/hr at 08/11/14 3710    Principal Problem:   HCAP (healthcare-associated pneumonia) Active Problems:   MDD (major depressive disorder), recurrent episode, moderate   HYPERTENSION, BENIGN SYSTEMIC   S/P CABG x 3, April 16   Autonomic orthostatic hypotension   Protein-calorie malnutrition   Pleural effusion on left   Malnutrition of moderate degree   Pleural effusion    Time spent: 35 minutes    [facility] MD [facility] Pager [phone]. If 7PM-7AM, please contact night-coverage at [email or web address], password [facility] 08/11/2014, 11:55 AM  LOS: 4 days

## 2014-08-11 NOTE — Progress Notes (Signed)
Assisted the patient with standing and she stated that she felt dizzy and lightheaded, orthostatics were taken per physician and patient was positive for orthostatics, MD notified and orders given to recheck blood pressure later and check patient dizziness. D.Camaria Gerald RN

## 2014-08-11 NOTE — Progress Notes (Signed)
Patient Name: Maria Fox Date of Encounter: 08/11/2014  Primary Cardiologist: [facility], [facility]/ also scheduled for new pt appt with Dr. Radford Pax in July   Principal Problem:   HCAP (healthcare-associated pneumonia) Active Problems:   MDD (major depressive disorder), recurrent episode, moderate   HYPERTENSION, BENIGN SYSTEMIC   S/P CABG x 3, April 16   Autonomic orthostatic hypotension   Protein-calorie malnutrition   Pleural effusion on left   Malnutrition of moderate degree   Pleural effusion    SUBJECTIVE  Denies any CP or SOB.   CURRENT MEDS . aspirin EC  81 mg Oral Daily  . atorvastatin  20 mg Oral QHS  . carvedilol  6.25 mg Oral BID WC  . clopidogrel  75 mg Oral Daily  . enoxaparin (LOVENOX) injection  30 mg Subcutaneous QHS  . famotidine  20 mg Oral QHS  . feeding supplement (ENSURE ENLIVE)  237 mL Oral BID BM  . ferrous sulfate  325 mg Oral Daily  . folic acid  1 mg Oral Daily  . gabapentin  100 mg Oral TID  . insulin aspart  0-9 Units Subcutaneous TID WC  . levofloxacin  750 mg Oral Q48H  . linagliptin  5 mg Oral Daily  . midodrine  10 mg Oral TID WC  . polyethylene glycol  17 g Oral BID  . potassium chloride  40 mEq Oral Daily  . senna  2 tablet Oral QHS  . sertraline  50 mg Oral Daily  . sodium chloride  3 mL Intravenous Q12H    OBJECTIVE  Filed Vitals:   08/10/14 1330 08/10/14 1637 08/10/14 2110 08/11/14 0602  BP: 147/92 129/95 139/83 114/74  Pulse: 84 78 83 103  Temp: 98.4 F (36.9 C)  97.8 F (36.6 C) 97.6 F (36.4 C)  TempSrc: Oral  Oral Oral  Resp: 18  18 18   Height:      Weight:    124 lb 8 oz (56.473 kg)  SpO2: 98%  96% 98%    Intake/Output Summary (Last 24 hours) at 08/11/14 0948 Last data filed at 08/10/14 2111  Gross per 24 hour  Intake    340 ml  Output    800 ml  Net   -460 ml   Filed Weights   08/09/14 0554 08/10/14 0646 08/11/14 0602  Weight: 127 lb 4.8 oz (57.743 kg) 126 lb 11.2 oz (57.471 kg) 124 lb 8 oz (56.473 kg)     PHYSICAL EXAM  General: Pleasant, NAD. Neuro: Alert and oriented X 3. Moves all extremities spontaneously. Psych: Normal affect. HEENT:  Normal  Neck: Supple without bruits or JVD. Lungs:  Resp regular and unlabored, CTA. Heart: RRR no s3, s4, or murmurs. Abdomen: Soft, non-tender, non-distended, BS + x 4.  Extremities: No clubbing, cyanosis or edema. DP/PT/Radials 2+ and equal bilaterally.  Accessory Clinical Findings  CBC  Recent Labs  08/09/14 0504 08/11/14 0433  WBC 7.3 10.0  NEUTROABS 3.9  --   HGB 12.8 13.4  HCT 37.8 40.0  MCV 87.3 88.5  PLT 279 791   Basic Metabolic Panel  Recent Labs  08/09/14 0504 08/10/14 0926 08/11/14 0433  NA 133* 137 135  K 4.6 4.9 4.6  CL 102 101 100*  CO2 23 25 23   GLUCOSE 159* 163* 126*  BUN 20 20 26*  CREATININE 1.28* 1.41* 1.96*  CALCIUM 9.4 10.1 9.7  MG 1.8 2.0  --    Liver Function Tests  Recent Labs  08/09/14 0504  AST  22  ALT 14  ALKPHOS 72  BILITOT 0.9  PROT 7.3  ALBUMIN 3.5   Thyroid Function Tests  Recent Labs  08/11/14 0433  TSH 3.353    TELE NSR with HR 90-100s, single run of 15 beats of NSVT at 1:03 yesterday    ECG  No new EKG  Echocardiogram 08/08/2014  LV EF: 60% -  65%  ------------------------------------------------------------------- Indications:   Dyspnea 786.09.  ------------------------------------------------------------------- History:  PMH: Pleural effusion. Status post thoracentesis. Coronary artery disease. Stroke. Risk factors: Hypertension. Diabetes mellitus. Dyslipidemia.  ------------------------------------------------------------------- Study Conclusions  - Left ventricle: The cavity size was normal. There was moderate focal basal and mild concentric hypertrophy of the left ventricle. Systolic function was normal. The estimated ejection fraction was in the range of 60% to 65%. Wall motion was normal; there were no regional wall motion  abnormalities. Doppler parameters are consistent with abnormal left ventricular relaxation (grade 1 diastolic dysfunction). There was no evidence of elevated ventricular filling pressure by Doppler parameters. - Aortic valve: Trileaflet; normal thickness leaflets. There was no regurgitation. - Mitral valve: There was no regurgitation. - Left atrium: The atrium was normal in size. - Right ventricle: Systolic function was normal. - Right atrium: The atrium was normal in size. - Tricuspid valve: There was trivial regurgitation. - Pulmonic valve: There was trivial regurgitation. - Pulmonary arteries: Systolic pressure was within the normal range. - Inferior vena cava: The vessel was normal in size. - Pericardium, extracardiac: There was no pericardial effusion.     Radiology/Studies  Dg Chest 2 View  08/09/2014   CLINICAL DATA:  Shortness of breath, weakness, pneumonia.  EXAM: CHEST  2 VIEW  COMPARISON:  Chest x-ray of Aug 08, 2014  FINDINGS: The lungs are adequately inflated. A small left pleural effusion persists. No pneumothorax is evident. Infiltrate or atelectasis at the left lung base is improving. The heart and pulmonary vascularity are normal. There is 6 intact sternal wires. The trachea is midline. The bony thorax is unremarkable.  IMPRESSION: Improving appearance of the left lower lobe pneumonia and small left pleural effusion. Elsewhere the chest reveals no acute abnormality.   Electronically Signed   By: David  Martinique M.D.   On: 08/09/2014 09:16   Ct Head Wo Contrast  08/07/2014   CLINICAL DATA:  Right-sided headache since yesterday  EXAM: CT HEAD WITHOUT CONTRAST  TECHNIQUE: Contiguous axial images were obtained from the base of the skull through the vertex without intravenous contrast.  COMPARISON:  07/14/2014  FINDINGS: Mild cortical volume loss noted with proportional ventricular prominence. Areas of periventricular white matter hypodensity are most compatible with  small vessel ischemic change. No acute hemorrhage, infarct, or mass lesion is identified. No midline shift. Right maxillary sinus air-fluid level. Orbits are unremarkable. No skull fracture. No soft tissue abnormality.  IMPRESSION: Chronic findings as above without acute intracranial abnormality.  Right maxillary sinusitis.   Electronically Signed   By: Conchita Paris M.D.   On: 08/07/2014 15:11   Ct Angio Chest Pe W/cm &/or Wo Cm  08/07/2014   CLINICAL DATA:  Chest pain and shortness of Breath, recent open heart surgery  EXAM: CT ANGIOGRAPHY CHEST WITH CONTRAST  TECHNIQUE: Multidetector CT imaging of the chest was performed using the standard protocol during bolus administration of intravenous contrast. Multiplanar CT image reconstructions and MIPs were obtained to evaluate the vascular anatomy.  CONTRAST:  61mL OMNIPAQUE IOHEXOL 350 MG/ML SOLN  COMPARISON:  Plain film from earlier in the same day  FINDINGS: Large  left-sided pleural effusion is identified. Left lower lobe consolidation is noted associated with the effusion. The lungs are otherwise clear. The hilar and mediastinal structures are within normal limits. The thoracic inlet shows no acute abnormality. Coronary calcifications are seen. Changes of prior coronary bypass grafting are noted. The pulmonary artery is well visualized and demonstrates a normal branching pattern. No intraluminal filling defect to suggest pulmonary embolism is identified. Diffuse aortic calcifications are seen without aneurysmal dilatation or dissection.  The visualized upper abdomen reveals no acute abnormality. Bony structures are within normal limits.  Review of the MIP images confirms the above findings.  IMPRESSION: Large left-sided pleural effusion with associated consolidation.  No pulmonary emboli are identified   Electronically Signed   By: Inez Catalina M.D.   On: 08/07/2014 17:00    US Thoracentesis Asp Pleural Space W/img Guide  08/08/2014   INDICATION:  Symptomatic L sided pleural effusion  EXAM: US THORACENTESIS ASP PLEURAL SPACE W/IMG GUIDE  COMPARISON:  None.  MEDICATIONS: 10 cc 1% lidocaine  COMPLICATIONS: None immediate  TECHNIQUE: Informed written consent was obtained from the patient after a discussion of the risks, benefits and alternatives to treatment. A timeout was performed prior to the initiation of the procedure.  Initial ultrasound scanning demonstrates a left pleural effusion. The lower chest was prepped and draped in the usual sterile fashion. 1% lidocaine was used for local anesthesia.  Under direct ultrasound guidance, a 19 gauge, 7-cm, Yueh catheter was introduced. An ultrasound image was saved for documentation purposes. The thoracentesis was performed. The catheter was removed and a dressing was applied. The patient tolerated the procedure well without immediate post procedural complication. The patient was escorted to have an upright chest radiograph.  FINDINGS: A total of approximately 330 cc of serous fluid was removed. Requested samples were sent to the laboratory.  IMPRESSION: Successful ultrasound-guided L sided thoracentesis yielding 330 cc of pleural fluid.  Read by:  Lavonia Drafts [facility]   Electronically Signed   By: Jerilynn Mages.  Shick M.D.   On: 08/08/2014 11:40     ASSESSMENT AND PLAN  69 y/o female with CAD s/p recent CABG 06/2014 at OSH, normal LV function on recent 2D echo, admitted for worsening dyspnea in the setting of presumed failed HCAP. Cardiology consulted for 15 beat run of NSVT.   1. NSVT: coreg increased yesterday, no recurrent NSVT since yesterday 1:03 pm   - has significant orthostatic hypotension, SBP currently 110-120s, hesitant to increase coreg further  - Echo 08/08/2014 EF 60-65%, no RWMA, grade 1 diastolic dysfunction  2. CAD s/p CABG 06/2014 at OSH  - continue ASA, plavix, BB and statin. No ACEI/ARB due to h/o angioedema  3. HCAP: per primary team  4. Elevated d-dimer with neg PE on CT  5. Orthostatic  hypotension  - Lying BP 101/43 HR 65. Sitting BP 88/62 HR 98. Standing at 3 min BP 71/30, HR 121  6. Acute on chronic diastolic HF  - currently euvolemic, IV lasix held due to AKI  - can likely resume PO lasix in 1-2 days  7. AKI: likely related to aggressive diuresis  8. L side pleural effusion  - s/p thoracentesis 5/30  Signed, Almyra Deforest PA-C Pager: [phone]   The patient was seen, examined and discussed with Almyra Deforest, PA-C and I agree with the above.   No recurrent nsVT on telemetry, the patient is euvolemic, so agree with holding lasix for AKI, we will follow.  Dorothy Spark 08/11/2014

## 2014-08-12 ENCOUNTER — Other Ambulatory Visit: Payer: Self-pay

## 2014-08-12 DIAGNOSIS — I472 Ventricular tachycardia: Secondary | ICD-10-CM | POA: Insufficient documentation

## 2014-08-12 DIAGNOSIS — I4729 Other ventricular tachycardia: Secondary | ICD-10-CM | POA: Insufficient documentation

## 2014-08-12 LAB — GLUCOSE, CAPILLARY
GLUCOSE-CAPILLARY: 216 mg/dL — AB (ref 65–99)
Glucose-Capillary: 94 mg/dL (ref 65–99)
Glucose-Capillary: 80 mg/dL (ref 65–99)
Glucose-Capillary: 78 mg/dL (ref 65–99)

## 2014-08-12 LAB — CBC
HCT: 34.2 % — ABNORMAL LOW (ref 36.0–46.0)
Hemoglobin: 11.6 g/dL — ABNORMAL LOW (ref 12.0–15.0)
MCH: 29.7 pg (ref 26.0–34.0)
MCHC: 33.9 g/dL (ref 30.0–36.0)
MCV: 87.7 fL (ref 78.0–100.0)
PLATELETS: 222 10*3/uL (ref 150–400)
RBC: 3.9 MIL/uL (ref 3.87–5.11)
RDW: 13.4 % (ref 11.5–15.5)
WBC: 6.9 10*3/uL (ref 4.0–10.5)

## 2014-08-12 LAB — BASIC METABOLIC PANEL
Anion gap: 9 (ref 5–15)
BUN: 25 mg/dL — ABNORMAL HIGH (ref 6–20)
CALCIUM: 9.1 mg/dL (ref 8.9–10.3)
CHLORIDE: 105 mmol/L (ref 101–111)
CO2: 22 mmol/L (ref 22–32)
Creatinine, Ser: 1.65 mg/dL — ABNORMAL HIGH (ref 0.44–1.00)
GFR calc Af Amer: 35 mL/min — ABNORMAL LOW (ref >60)
GFR calc non Af Amer: 30 mL/min — ABNORMAL LOW (ref >60)
GLUCOSE: 111 mg/dL — AB (ref 65–99)
POTASSIUM: 3.8 mmol/L (ref 3.5–5.1)
SODIUM: 136 mmol/L (ref 135–145)

## 2014-08-12 LAB — MAGNESIUM: Magnesium: 2 mg/dL (ref 1.7–2.4)

## 2014-08-12 LAB — HCV RNA QUANT: HCV QUANT: NOT DETECTED [IU]/mL — AB (ref <15)

## 2014-08-12 MED ORDER — DIPHENHYDRAMINE HCL 12.5 MG/5ML PO ELIX
12.5000 mg | ORAL_SOLUTION | Freq: Once | ORAL | Status: AC
  Administered 2014-08-12: 12.5 mg via ORAL
  Filled 2014-08-12: qty 5

## 2014-08-12 MED ORDER — SODIUM CHLORIDE 0.9 % IV SOLN
INTRAVENOUS | Status: AC
  Administered 2014-08-12 – 2014-08-13 (×3): via INTRAVENOUS

## 2014-08-12 MED ORDER — SODIUM CHLORIDE 0.9 % IV BOLUS (SEPSIS)
250.0000 mL | Freq: Once | INTRAVENOUS | Status: AC
  Administered 2014-08-12: 250 mL via INTRAVENOUS

## 2014-08-12 MED ORDER — METOCLOPRAMIDE HCL 5 MG/ML IJ SOLN
10.0000 mg | Freq: Once | INTRAMUSCULAR | Status: AC
  Administered 2014-08-12 – 2014-08-13 (×2): 10 mg via INTRAVENOUS
  Filled 2014-08-12 (×2): qty 2

## 2014-08-12 MED ORDER — TRAMADOL HCL 50 MG PO TABS
50.0000 mg | ORAL_TABLET | Freq: Two times a day (BID) | ORAL | Status: DC | PRN
  Administered 2014-08-12 – 2014-08-17 (×8): 50 mg via ORAL
  Filled 2014-08-12 (×8): qty 1

## 2014-08-12 NOTE — Patient Outreach (Signed)
Unsuccessful attempt made to contact patient via telephone. HIPPA compliant message left for patient with contact information for this RNCM.  Plan: Make another attempt to contact patient on Monday, June 6.

## 2014-08-12 NOTE — Progress Notes (Signed)
Patient Name: TAIYANA KISSLER Date of Encounter: 08/12/2014  Primary Cardiologist: J[facility], [facility]/ also scheduled for new pt appt with Dr. Radford Pax in July   Principal Problem:   HCAP (healthcare-associated pneumonia) Active Problems:   MDD (major depressive disorder), recurrent episode, moderate   HYPERTENSION, BENIGN SYSTEMIC   S/P CABG x 3, April 16   Autonomic orthostatic hypotension   Protein-calorie malnutrition   Pleural effusion on left   Malnutrition of moderate degree   Pleural effusion   Pleural effusion, left    SUBJECTIVE  The patient feels very weak and dizzy, denies any CP or SOB.   CURRENT MEDS . aspirin EC  81 mg Oral Daily  . atorvastatin  20 mg Oral QHS  . carvedilol  6.25 mg Oral BID WC  . clopidogrel  75 mg Oral Daily  . enoxaparin (LOVENOX) injection  30 mg Subcutaneous QHS  . famotidine  20 mg Oral QHS  . feeding supplement (ENSURE ENLIVE)  237 mL Oral BID BM  . ferrous sulfate  325 mg Oral Daily  . folic acid  1 mg Oral Daily  . gabapentin  100 mg Oral TID  . insulin aspart  0-9 Units Subcutaneous TID WC  . levofloxacin  750 mg Oral Q48H  . linagliptin  5 mg Oral Daily  . midodrine  10 mg Oral TID WC  . polyethylene glycol  17 g Oral BID  . potassium chloride  40 mEq Oral Daily  . senna  2 tablet Oral QHS  . sertraline  50 mg Oral Daily  . sodium chloride  3 mL Intravenous Q12H   OBJECTIVE  Filed Vitals:   08/11/14 1641 08/11/14 1803 08/11/14 2120 08/12/14 0600  BP:  150/79 137/55 118/59  Pulse: 70 70 77 79  Temp:   98 F (36.7 C) 98.2 F (36.8 C)  TempSrc:   Oral Oral  Resp:  18 16 18   Height:      Weight:    126 lb 8 oz (57.38 kg)  SpO2:  98% 100% 97%    Intake/Output Summary (Last 24 hours) at 08/12/14 1031 Last data filed at 08/11/14 2138  Gross per 24 hour  Intake 1221.25 ml  Output      0 ml  Net 1221.25 ml   Filed Weights   08/10/14 0646 08/11/14 0602 08/12/14 0600  Weight: 126 lb 11.2 oz (57.471 kg) 124 lb 8 oz (56.473 kg)  126 lb 8 oz (57.38 kg)    PHYSICAL EXAM  General: Pleasant, NAD. Neuro: Alert and oriented X 3. Moves all extremities spontaneously. Psych: Normal affect. HEENT:  Normal  Neck: Supple without bruits or JVD. Lungs:  Resp regular and unlabored, rales at left base. Heart: RRR no s3, s4, or murmurs. Abdomen: Soft, non-tender, non-distended, BS + x 4.  Extremities: No clubbing, cyanosis or edema. DP/PT/Radials 2+ and equal bilaterally.  Accessory Clinical Findings  CBC  Recent Labs  08/11/14 0433 08/12/14 0530  WBC 10.0 6.9  HGB 13.4 11.6*  HCT 40.0 34.2*  MCV 88.5 87.7  PLT 299 093   Basic Metabolic Panel  Recent Labs  08/10/14 0926 08/11/14 0433 08/12/14 0530  NA 137 135 136  K 4.9 4.6 3.8  CL 101 100* 105  CO2 25 23 22   GLUCOSE 163* 126* 111*  BUN 20 26* 25*  CREATININE 1.41* 1.96* 1.65*  CALCIUM 10.1 9.7 9.1  MG 2.0  --  2.0    Recent Labs  08/11/14 0433  TSH 3.353  TELE NSR with HR 90-100s, single run of 15 beats of NSVT at 1:03 yesterday    ECG  No new EKG  Echocardiogram 08/08/2014  LV EF: 60% -  65%  ------------------------------------------------------------------- Indications:   Dyspnea 786.09.  ------------------------------------------------------------------- History:  PMH: Pleural effusion. Status post thoracentesis. Coronary artery disease. Stroke. Risk factors: Hypertension. Diabetes mellitus. Dyslipidemia.  ------------------------------------------------------------------- Study Conclusions  - Left ventricle: The cavity size was normal. There was moderate focal basal and mild concentric hypertrophy of the left ventricle. Systolic function was normal. The estimated ejection fraction was in the range of 60% to 65%. Wall motion was normal; there were no regional wall motion abnormalities. Doppler parameters are consistent with abnormal left ventricular relaxation (grade 1 diastolic dysfunction). There  was no evidence of elevated ventricular filling pressure by Doppler parameters. - Aortic valve: Trileaflet; normal thickness leaflets. There was no regurgitation. - Mitral valve: There was no regurgitation. - Left atrium: The atrium was normal in size. - Right ventricle: Systolic function was normal. - Right atrium: The atrium was normal in size. - Tricuspid valve: There was trivial regurgitation. - Pulmonic valve: There was trivial regurgitation. - Pulmonary arteries: Systolic pressure was within the normal range. - Inferior vena cava: The vessel was normal in size. - Pericardium, extracardiac: There was no pericardial effusion.     Radiology/Studies  Dg Chest 2 View  08/09/2014   CLINICAL DATA:  Shortness of breath, weakness, pneumonia.  EXAM: CHEST  2 VIEW  COMPARISON:  Chest x-ray of Aug 08, 2014  FINDINGS: The lungs are adequately inflated. A small left pleural effusion persists. No pneumothorax is evident. Infiltrate or atelectasis at the left lung base is improving. The heart and pulmonary vascularity are normal. There is 6 intact sternal wires. The trachea is midline. The bony thorax is unremarkable.  IMPRESSION: Improving appearance of the left lower lobe pneumonia and small left pleural effusion. Elsewhere the chest reveals no acute abnormality.     Ct Angio Chest Pe W/cm &/or Wo Cm  08/07/2014   CLINICAL DATA:  Chest pain and shortness of Breath, recent open heart surgery  EXAM: CT ANGIOGRAPHY CHEST WITH CONTRAST  TECHNIQUE: Multidetector CT imaging of the chest was performed using the standard protocol during bolus administration of intravenous contrast. Multiplanar CT image reconstructions and MIPs were obtained to evaluate the vascular anatomy.  CONTRAST:  62mL OMNIPAQUE IOHEXOL 350 MG/ML SOLN  COMPARISON:  Plain film from earlier in the same day  FINDINGS: Large left-sided pleural effusion is identified. Left lower lobe consolidation is noted associated with the effusion.  The lungs are otherwise clear. The hilar and mediastinal structures are within normal limits. The thoracic inlet shows no acute abnormality. Coronary calcifications are seen. Changes of prior coronary bypass grafting are noted. The pulmonary artery is well visualized and demonstrates a normal branching pattern. No intraluminal filling defect to suggest pulmonary embolism is identified. Diffuse aortic calcifications are seen without aneurysmal dilatation or dissection.  The visualized upper abdomen reveals no acute abnormality. Bony structures are within normal limits.  Review of the MIP images confirms the above findings.  IMPRESSION: Large left-sided pleural effusion with associated consolidation.  No pulmonary emboli are identified   Electronically Signed      ASSESSMENT AND PLAN  69 y/o female with CAD s/p recent CABG 06/2014 at OSH, normal LV function on recent 2D echo, admitted for worsening dyspnea in the setting of presumed failed HCAP. Cardiology consulted for 15 beat run of NSVT.   1. NSVT: coreg  increased on 6/1, no recurrent NSVT since then  - has significant orthostatic hypotension, SBP currently 110-120s, hesitant to increase coreg further  - Echo 08/08/2014 EF 60-65%, no RWMA, grade 1 diastolic dysfunction  2. CAD s/p CABG 06/2014 at OSH  - continue ASA, plavix, BB and statin. No ACEI/ARB due to h/o angioedema  3. HCAP: per primary team  4. Elevated d-dimer with neg PE on CT  5. Orthostatic hypotension  - Lying BP 101/43 HR 65. Sitting BP 88/62 HR 98. Standing at 3 min BP 71/30, HR 121  6. Acute on chronic diastolic HF  - currently euvolemic, IV lasix held due to AKI  - can likely resume PO lasix in 1-2 days  7. AKI: likely related to aggressive diuresis  8. L side pleural effusion  - s/p thoracentesis 5/30   Dorothy Spark 08/12/2014

## 2014-08-12 NOTE — Progress Notes (Signed)
[facility] PROGRESS NOTE  Maria Fox OYD:[id number] DOB: May 19, 1945 DOA: 08/07/2014 PCP: [name], MD  Assessment/Plan: #1 sepsis secondary to healthcare associated pneumonia and probable parapneumonic effusion Clinical improvement. Per light's criteria effusion exudative in nature.Chest x-ray with improvement. Blood cultures pending. Body fluid cultures negative. Patient has been transitioned from IV antibodies to oral Levaquin to complete a two-week course of antibiotic therapy. Follow.  #2 nonsustained V. tach Patient noted to be nonsustained V. tach. Patient denied any chest pain. Magnesium at 2. Potassium is at 4.6. Coreg dose was increased to 6.25 mg twice a day. TSH within normal limits at 3.353. 2-D echo with EF of 60-65% with no wall motion abnormalities and grade 1 diastolic dysfunction. Patient has been seen in consultation by cardiology and no further workup needed at this time. Follow.   #3 orthostatic hypotension/autonomic dysfunction secondary to diabetes Continue Midodrine. Continue to hold Lasix. Continue TED hose. Gentle hydration.   #4 acute diastolic heart failure Patient was on Lasix 40 mg IV daily. Patient with orthostasis and complained of dizziness and a such direct were discontinued. Creatinine trending back down. Continue gentle hydration and monitor closely for volume overload.   #5 type 2 diabetes Hemoglobin A1c was 8.2 on 06/2014. Patient also with a history of diabetic gastroparesis and neuropathy. Continue sliding scale insulin. Continue linagliptin.  #6 recent CABG 3 in [place] Stable. Continue aspirin, Plavix. Patient was unable to tolerate ACE inhibitor secondary to hypotension. Patient currently tolerating Coreg. Monitor for now.  #7 history of CVA Continue aspirin and Plavix for secondary stroke prevention.  #8 anemia H&H stable. Continue iron sulfate.  #9 right upper quadrant abdominal pain/cirrhosis/hep C HCVRNA genotype ordered  pending. Will need to follow-up with the ID clinic as outpatient as well as GI.  #10 vitiligo  #11 moderate protein calorie malnutrition Stable.   #12 prophylaxis Pepcid for GI prophylaxis. Lovenox for DVT prophylaxis.    Code Status: Full Family Communication: updated patient and husband at bedside. Disposition Plan: Home when medically stable.   Consultants:  Cardiology: Dr Irish Lack  08/10/14   Procedures:  CT angiogram chest 08/07/2014  CT head without contrast 08/07/2014  Chest x-ray 08/07/2014, 08/08/2014 08/09/2014,  2-D echo 08/08/2014   ultrasound-guided thoracentesis 08/08/2014 300 mL removed  Antibiotics:  IV vancomycin 08/07/2014 >>>> 08/09/2014    IV cefepime 08/07/2014>>>>> 08/09/2014  Oral Levaquin 08/11/2014  HPI/Subjective: Patient complaining of nausea. Patient complaining of dizziness with positional changes. Patient c/o headache.   Objective: Filed Vitals:   08/12/14 1450  BP: 162/67  Pulse: 73  Temp: 97.6 F (36.4 C)  Resp: 18    Intake/Output Summary (Last 24 hours) at 08/12/14 1613 Last data filed at 08/12/14 1048  Gross per 24 hour  Intake 1521.25 ml  Output      0 ml  Net 1521.25 ml   Filed Weights   08/10/14 0646 08/11/14 0602 08/12/14 0600  Weight: 57.471 kg (126 lb 11.2 oz) 56.473 kg (124 lb 8 oz) 57.38 kg (126 lb 8 oz)    Exam:   General:  nad  Cardiovascular: RRR  Respiratory: CTAB  Abdomen: soft, nontender, nondistended, positive bowel sounds.  Musculoskeletal: no clubbing cyanosis or edema.  Data Reviewed: Basic Metabolic Panel:  Recent Labs Lab 08/08/14 0312 08/09/14 0504 08/10/14 0926 08/11/14 0433 08/12/14 0530  NA 136 133* 137 135 136  K 4.1 4.6 4.9 4.6 3.8  CL 103 102 101 100* 105  CO2 22 23 25 23 22   GLUCOSE 187*  159* 163* 126* 111*  BUN 11 20 20  26* 25*  CREATININE 1.04* 1.28* 1.41* 1.96* 1.65*  CALCIUM 9.6 9.4 10.1 9.7 9.1  MG  --  1.8 2.0  --  2.0   Liver Function Tests:  Recent  Labs Lab 08/07/14 1344 08/09/14 0504  AST 22 22  ALT 14 14  ALKPHOS 82 72  BILITOT 0.6 0.9  PROT 8.0 7.3  ALBUMIN 3.8 3.5    Recent Labs Lab 08/07/14 1433  LIPASE 34   No results for input(s): AMMONIA in the last 168 hours. CBC:  Recent Labs Lab 08/07/14 1344 08/08/14 0312 08/09/14 0504 08/11/14 0433 08/12/14 0530  WBC 7.7 8.1 7.3 10.0 6.9  NEUTROABS 4.5  --  3.9  --   --   HGB 13.3 12.9 12.8 13.4 11.6*  HCT 38.7 37.5 37.8 40.0 34.2*  MCV 86.2 86.6 87.3 88.5 87.7  PLT 292 288 279 299 222   Cardiac Enzymes: No results for input(s): CKTOTAL, CKMB, CKMBINDEX, TROPONINI in the last 168 hours. BNP (last 3 results)  Recent Labs  07/07/14 2210 08/07/14 1344  BNP 125.5* 297.8*    ProBNP (last 3 results) No results for input(s): PROBNP in the last 8760 hours.  CBG:  Recent Labs Lab 08/11/14 1122 08/11/14 1639 08/11/14 2119 08/12/14 0612 08/12/14 1148  GLUCAP 199* 104* 82 94 216*    Recent Results (from the past 240 hour(s))  Culture, blood (routine x 2)     Status: None (Preliminary result)   Collection Time: 08/07/14  5:35 PM  Result Value Ref Range Status   Specimen Description BLOOD RIGHT ARM  Final   Special Requests BOTTLES DRAWN AEROBIC AND ANAEROBIC 5CC  Final   Culture   Final           BLOOD CULTURE RECEIVED NO GROWTH TO DATE CULTURE WILL BE HELD FOR 5 DAYS BEFORE ISSUING A FINAL NEGATIVE REPORT Performed at [facility]    Report Status PENDING  Incomplete  Culture, blood (routine x 2)     Status: None (Preliminary result)   Collection Time: 08/07/14  5:46 PM  Result Value Ref Range Status   Specimen Description BLOOD RIGHT WRIST  Final   Special Requests BOTTLES DRAWN AEROBIC AND ANAEROBIC 5CC  Final   Culture   Final           BLOOD CULTURE RECEIVED NO GROWTH TO DATE CULTURE WILL BE HELD FOR 5 DAYS BEFORE ISSUING A FINAL NEGATIVE REPORT Performed at [facility]    Report Status PENDING  Incomplete  Body fluid culture      Status: None   Collection Time: 08/08/14 10:37 AM  Result Value Ref Range Status   Specimen Description FLUID PLEURAL LEFT  Final   Special Requests NONE  Final   Gram Stain   Final    NO WBC SEEN NO ORGANISMS SEEN Performed at [facility]    Culture   Final    NO GROWTH 3 DAYS Performed at [facility]    Report Status 08/11/2014 FINAL  Final     Studies: No results found.  Scheduled Meds: . aspirin EC  81 mg Oral Daily  . atorvastatin  20 mg Oral QHS  . carvedilol  6.25 mg Oral BID WC  . clopidogrel  75 mg Oral Daily  . enoxaparin (LOVENOX) injection  30 mg Subcutaneous QHS  . famotidine  20 mg Oral QHS  . feeding supplement (ENSURE ENLIVE)  237 mL Oral  BID BM  . ferrous sulfate  325 mg Oral Daily  . folic acid  1 mg Oral Daily  . gabapentin  100 mg Oral TID  . insulin aspart  0-9 Units Subcutaneous TID WC  . levofloxacin  750 mg Oral Q48H  . linagliptin  5 mg Oral Daily  . metoCLOPramide (REGLAN) injection  10 mg Intravenous Once  . midodrine  10 mg Oral TID WC  . polyethylene glycol  17 g Oral BID  . potassium chloride  40 mEq Oral Daily  . senna  2 tablet Oral QHS  . sertraline  50 mg Oral Daily  . sodium chloride  3 mL Intravenous Q12H   Continuous Infusions: . sodium chloride 75 mL/hr at 08/12/14 1337    Principal Problem:   HCAP (healthcare-associated pneumonia) Active Problems:   MDD (major depressive disorder), recurrent episode, moderate   HYPERTENSION, BENIGN SYSTEMIC   S/P CABG x 3, April 16   Autonomic orthostatic hypotension   Protein-calorie malnutrition   Pleural effusion on left   Malnutrition of moderate degree   Pleural effusion   Pleural effusion, left    Time spent: 35 minutes    [facility] MD [facility] Pager [phone]. If 7PM-7AM, please contact night-coverage at [email or web address], password [facility] 08/12/2014, 4:13 PM  LOS: 5 days

## 2014-08-12 NOTE — Progress Notes (Signed)
At 1100 introduced self to pt as incoming nurse.  Call bell at reach.  Instructed to call for assistance as needed.  Verbalized understanding.  Karie Kirks, Therapist, sports.

## 2014-08-13 LAB — BASIC METABOLIC PANEL
ANION GAP: 7 (ref 5–15)
BUN: 15 mg/dL (ref 6–20)
CHLORIDE: 109 mmol/L (ref 101–111)
CO2: 22 mmol/L (ref 22–32)
Calcium: 9.1 mg/dL (ref 8.9–10.3)
Creatinine, Ser: 1.12 mg/dL — ABNORMAL HIGH (ref 0.44–1.00)
GFR calc Af Amer: 56 mL/min — ABNORMAL LOW (ref >60)
GFR calc non Af Amer: 48 mL/min — ABNORMAL LOW (ref >60)
GLUCOSE: 78 mg/dL (ref 65–99)
POTASSIUM: 4.9 mmol/L (ref 3.5–5.1)
SODIUM: 138 mmol/L (ref 135–145)

## 2014-08-13 LAB — CULTURE, BLOOD (ROUTINE X 2)
Culture: NO GROWTH
Culture: NO GROWTH

## 2014-08-13 LAB — CBC
HCT: 34.7 % — ABNORMAL LOW (ref 36.0–46.0)
Hemoglobin: 11.7 g/dL — ABNORMAL LOW (ref 12.0–15.0)
MCH: 29.5 pg (ref 26.0–34.0)
MCHC: 33.7 g/dL (ref 30.0–36.0)
MCV: 87.4 fL (ref 78.0–100.0)
Platelets: 221 10*3/uL (ref 150–400)
RBC: 3.97 MIL/uL (ref 3.87–5.11)
RDW: 13.3 % (ref 11.5–15.5)
WBC: 6.7 10*3/uL (ref 4.0–10.5)

## 2014-08-13 LAB — GLUCOSE, CAPILLARY
Glucose-Capillary: 74 mg/dL (ref 65–99)
Glucose-Capillary: 132 mg/dL — ABNORMAL HIGH (ref 65–99)
Glucose-Capillary: 126 mg/dL — ABNORMAL HIGH (ref 65–99)
Glucose-Capillary: 139 mg/dL — ABNORMAL HIGH (ref 65–99)

## 2014-08-13 MED ORDER — METOPROLOL TARTRATE 25 MG PO TABS: 37.5000 mg | ORAL_TABLET | Freq: Two times a day (BID) | ORAL | Status: DC

## 2014-08-13 MED ORDER — METOPROLOL TARTRATE 12.5 MG HALF TABLET
37.5000 mg | ORAL_TABLET | Freq: Two times a day (BID) | ORAL | Status: DC
  Administered 2014-08-14 – 2014-08-15 (×3): 37.5 mg via ORAL
  Filled 2014-08-13 (×4): qty 1

## 2014-08-13 MED ORDER — SODIUM CHLORIDE 0.9 % IV SOLN
INTRAVENOUS | Status: AC
  Administered 2014-08-14: 01:00:00 via INTRAVENOUS

## 2014-08-13 MED ORDER — METOPROLOL SUCCINATE ER 25 MG PO TB24: 25.0000 mg | ORAL_TABLET | Freq: Every day | ORAL | Status: DC

## 2014-08-13 NOTE — Progress Notes (Signed)
[facility] PROGRESS NOTE  Maria Fox UKG:[id number] DOB: October 19, 1945 DOA: 08/07/2014 PCP: [name], MD  Assessment/Plan: #1 sepsis secondary to healthcare associated pneumonia and probable parapneumonic effusion Clinical improvement. Per light's criteria effusion exudative in nature.Chest x-ray with improvement. Blood cultures pending. Body fluid cultures negative. Patient has been transitioned from IV antibodies to oral Levaquin to complete a two-week course of antibiotic therapy. Follow.  #2 nonsustained V. tach Patient noted to have 15 beat run of nonsustained V. tach during this hospitalization. Patient noted to have a 7 beat run of nonsustained V. tach overnight. Patient denied any chest pain. Magnesium at 2. Potassium is at 4.6. Coreg dose was increased to 6.25 mg twice a day. Coreg has been changed to lopressor per cardiology as patient on midodrine. TSH within normal limits at 3.353. 2-D echo with EF of 60-65% with no wall motion abnormalities and grade 1 diastolic dysfunction. Patient has been seen in consultation by cardiology and no further workup needed at this time. Follow.   #3 orthostatic hypotension/autonomic dysfunction secondary to diabetes Continue Midodrine. Continue to hold Lasix. Continue TED hose. Coreg has been changed to Lopressor per cardiology. Gentle hydration.   #4 acute diastolic heart failure Patient was on Lasix 40 mg IV daily. Patient with orthostasis and complained of dizziness and a such diuretics were discontinued. Creatinine trending back down. Continue gentle hydration and monitor closely for volume overload.   #5 type 2 diabetes Hemoglobin A1c was 8.2 on 06/2014. Patient also with a history of diabetic gastroparesis and neuropathy. Continue sliding scale insulin. Continue linagliptin.  #6 recent CABG 3 in [place] Stable. Continue aspirin, Plavix. Patient was unable to tolerate ACE inhibitor secondary to hypotension. Patient currently  tolerating Coreg. Monitor for now.  #7 history of CVA Continue aspirin and Plavix for secondary stroke prevention.  #8 anemia H&H stable. Continue iron sulfate.  #9 right upper quadrant abdominal pain/cirrhosis/hep C HCV RNA genotype ordered pending. Will need to follow-up with the ID clinic as outpatient as well as GI.  #10 vitiligo  #11 moderate protein calorie malnutrition Stable.   #12 prophylaxis Pepcid for GI prophylaxis. Lovenox for DVT prophylaxis.    Code Status: Full Family Communication: updated patient and husband at bedside. Disposition Plan: Home when medically stable and no longer symptomatic from orthostatics.   Consultants:  Cardiology: Dr Irish Lack  08/10/14   Procedures:  CT angiogram chest 08/07/2014  CT head without contrast 08/07/2014  Chest x-ray 08/07/2014, 08/08/2014 08/09/2014,  2-D echo 08/08/2014   ultrasound-guided thoracentesis 08/08/2014 300 mL removed  Antibiotics:  IV vancomycin 08/07/2014 >>>> 08/09/2014    IV cefepime 08/07/2014>>>>> 08/09/2014  Oral Levaquin 08/11/2014  HPI/Subjective: Patient complaining of headache. Patient still with complaints of dizziness with positional changes.   Objective: Filed Vitals:   08/13/14 1300  BP: 187/91  Pulse: 79  Temp: 97.5 F (36.4 C)  Resp: 18    Intake/Output Summary (Last 24 hours) at 08/13/14 1509 Last data filed at 08/13/14 1409  Gross per 24 hour  Intake 2061.25 ml  Output   1401 ml  Net 660.25 ml   Filed Weights   08/11/14 0602 08/12/14 0600 08/13/14 0512  Weight: 56.473 kg (124 lb 8 oz) 57.38 kg (126 lb 8 oz) 57.834 kg (127 lb 8 oz)    Exam:   General:  nad  Cardiovascular: RRR  Respiratory: CTAB  Abdomen: soft, nontender, nondistended, positive bowel sounds.  Musculoskeletal: no clubbing cyanosis or edema.  Data Reviewed: Basic Metabolic Panel:  Recent  Labs Lab 08/09/14 0504 08/10/14 0926 08/11/14 0433 08/12/14 0530 08/13/14 0334  NA 133*  137 135 136 138  K 4.6 4.9 4.6 3.8 4.9  CL 102 101 100* 105 109  CO2 23 25 23 22 22   GLUCOSE 159* 163* 126* 111* 78  BUN 20 20 26* 25* 15  CREATININE 1.28* 1.41* 1.96* 1.65* 1.12*  CALCIUM 9.4 10.1 9.7 9.1 9.1  MG 1.8 2.0  --  2.0  --    Liver Function Tests:  Recent Labs Lab 08/07/14 1344 08/09/14 0504  AST 22 22  ALT 14 14  ALKPHOS 82 72  BILITOT 0.6 0.9  PROT 8.0 7.3  ALBUMIN 3.8 3.5    Recent Labs Lab 08/07/14 1433  LIPASE 34   No results for input(s): AMMONIA in the last 168 hours. CBC:  Recent Labs Lab 08/07/14 1344 08/08/14 0312 08/09/14 0504 08/11/14 0433 08/12/14 0530 08/13/14 0334  WBC 7.7 8.1 7.3 10.0 6.9 6.7  NEUTROABS 4.5  --  3.9  --   --   --   HGB 13.3 12.9 12.8 13.4 11.6* 11.7*  HCT 38.7 37.5 37.8 40.0 34.2* 34.7*  MCV 86.2 86.6 87.3 88.5 87.7 87.4  PLT 292 288 279 299 222 221   Cardiac Enzymes: No results for input(s): CKTOTAL, CKMB, CKMBINDEX, TROPONINI in the last 168 hours. BNP (last 3 results)  Recent Labs  07/07/14 2210 08/07/14 1344  BNP 125.5* 297.8*    ProBNP (last 3 results) No results for input(s): PROBNP in the last 8760 hours.  CBG:  Recent Labs Lab 08/12/14 1148 08/12/14 1625 08/12/14 2055 08/13/14 0645 08/13/14 1137  GLUCAP 216* 80 78 74 132*    Recent Results (from the past 240 hour(s))  Culture, blood (routine x 2)     Status: None   Collection Time: 08/07/14  5:35 PM  Result Value Ref Range Status   Specimen Description BLOOD RIGHT ARM  Final   Special Requests BOTTLES DRAWN AEROBIC AND ANAEROBIC 5CC  Final   Culture   Final    NO GROWTH 5 DAYS Performed at [facility]    Report Status 08/13/2014 FINAL  Final  Culture, blood (routine x 2)     Status: None   Collection Time: 08/07/14  5:46 PM  Result Value Ref Range Status   Specimen Description BLOOD RIGHT WRIST  Final   Special Requests BOTTLES DRAWN AEROBIC AND ANAEROBIC 5CC  Final   Culture   Final    NO GROWTH 5 DAYS Performed at  [facility]    Report Status 08/13/2014 FINAL  Final  Body fluid culture     Status: None   Collection Time: 08/08/14 10:37 AM  Result Value Ref Range Status   Specimen Description FLUID PLEURAL LEFT  Final   Special Requests NONE  Final   Gram Stain   Final    NO WBC SEEN NO ORGANISMS SEEN Performed at [facility]    Culture   Final    NO GROWTH 3 DAYS Performed at [facility]    Report Status 08/11/2014 FINAL  Final     Studies: No results found.  Scheduled Meds: . aspirin EC  81 mg Oral Daily  . atorvastatin  20 mg Oral QHS  . carvedilol  6.25 mg Oral BID WC  . clopidogrel  75 mg Oral Daily  . enoxaparin (LOVENOX) injection  30 mg Subcutaneous QHS  . famotidine  20 mg Oral QHS  . feeding supplement (ENSURE  ENLIVE)  237 mL Oral BID BM  . ferrous sulfate  325 mg Oral Daily  . folic acid  1 mg Oral Daily  . gabapentin  100 mg Oral TID  . insulin aspart  0-9 Units Subcutaneous TID WC  . levofloxacin  750 mg Oral Q48H  . linagliptin  5 mg Oral Daily  . [START ON 08/14/2014] metoprolol tartrate  37.5 mg Oral BID  . midodrine  10 mg Oral TID WC  . polyethylene glycol  17 g Oral BID  . potassium chloride  40 mEq Oral Daily  . senna  2 tablet Oral QHS  . sertraline  50 mg Oral Daily  . sodium chloride  3 mL Intravenous Q12H   Continuous Infusions:    Principal Problem:   HCAP (healthcare-associated pneumonia) Active Problems:   MDD (major depressive disorder), recurrent episode, moderate   HYPERTENSION, BENIGN SYSTEMIC   S/P CABG x 3, April 16   Autonomic orthostatic hypotension   Protein-calorie malnutrition   Pleural effusion on left   Malnutrition of moderate degree   Pleural effusion   Pleural effusion, left   NSVT (nonsustained ventricular tachycardia)    Time spent: 35 minutes    [facility] MD [facility] Pager [phone]. If 7PM-7AM, please contact night-coverage at [email or web address], password [facility] 08/13/2014, 3:09  PM  LOS: 6 days

## 2014-08-13 NOTE — Progress Notes (Addendum)
Patient ID: Maria Fox, female   DOB: [birth year], 68 y.o.   MRN: [id number]    Primary cardiologist:  Subjective:    Nauseous. No chest pain  Objective:   Temp:  [97.6 F (36.4 C)-97.9 F (36.6 C)] 97.8 F (36.6 C) (06/04 0512) Pulse Rate:  [68-82] 71 (06/04 0512) Resp:  [16-18] 18 (06/04 0512) BP: (120-181)/(56-79) 166/72 mmHg (06/04 0512) SpO2:  [97 %-99 %] 98 % (06/04 0512) Weight:  [127 lb 8 oz (57.834 kg)] 127 lb 8 oz (57.834 kg) (06/04 0512) Last BM Date: 08/11/14  Filed Weights   08/11/14 0602 08/12/14 0600 08/13/14 0512  Weight: 124 lb 8 oz (56.473 kg) 126 lb 8 oz (57.38 kg) 127 lb 8 oz (57.834 kg)    Intake/Output Summary (Last 24 hours) at 08/13/14 1032 Last data filed at 08/13/14 0600  Gross per 24 hour  Intake 2301.25 ml  Output    900 ml  Net 1401.25 ml    Telemetry: SR. 7 beat run NSVT  Exam:  General: NAD  Resp: CTAB  Cardiac: RRR, no m/r/g, no JVD  GI: abdomen soft, NT, ND  MSK: no LE edema  Neuro: no focal deficits    Lab Results:  Basic Metabolic Panel:  Recent Labs Lab 08/09/14 0504 08/10/14 0926 08/11/14 0433 08/12/14 0530 08/13/14 0334  NA 133* 137 135 136 138  K 4.6 4.9 4.6 3.8 4.9  CL 102 101 100* 105 109  CO2 23 25 23 22 22   GLUCOSE 159* 163* 126* 111* 78  BUN 20 20 26* 25* 15  CREATININE 1.28* 1.41* 1.96* 1.65* 1.12*  CALCIUM 9.4 10.1 9.7 9.1 9.1  MG 1.8 2.0  --  2.0  --     Liver Function Tests:  Recent Labs Lab 08/07/14 1344 08/09/14 0504  AST 22 22  ALT 14 14  ALKPHOS 82 72  BILITOT 0.6 0.9  PROT 8.0 7.3  ALBUMIN 3.8 3.5    CBC:  Recent Labs Lab 08/11/14 0433 08/12/14 0530 08/13/14 0334  WBC 10.0 6.9 6.7  HGB 13.4 11.6* 11.7*  HCT 40.0 34.2* 34.7*  MCV 88.5 87.7 87.4  PLT 299 222 221    Cardiac Enzymes: No results for input(s): CKTOTAL, CKMB, CKMBINDEX, TROPONINI in the last 168 hours.  BNP: No results for input(s): PROBNP in the last 8760 hours.  Coagulation: No results for  input(s): INR in the last 168 hours.  ECG:   Medications:   Scheduled Medications: . aspirin EC  81 mg Oral Daily  . atorvastatin  20 mg Oral QHS  . carvedilol  6.25 mg Oral BID WC  . clopidogrel  75 mg Oral Daily  . enoxaparin (LOVENOX) injection  30 mg Subcutaneous QHS  . famotidine  20 mg Oral QHS  . feeding supplement (ENSURE ENLIVE)  237 mL Oral BID BM  . ferrous sulfate  325 mg Oral Daily  . folic acid  1 mg Oral Daily  . gabapentin  100 mg Oral TID  . insulin aspart  0-9 Units Subcutaneous TID WC  . levofloxacin  750 mg Oral Q48H  . linagliptin  5 mg Oral Daily  . midodrine  10 mg Oral TID WC  . polyethylene glycol  17 g Oral BID  . potassium chloride  40 mEq Oral Daily  . senna  2 tablet Oral QHS  . sertraline  50 mg Oral Daily  . sodium chloride  3 mL Intravenous Q12H     Infusions:  PRN Medications:  acetaminophen, hydrocortisone cream, metoCLOPramide, promethazine **OR** promethazine, traMADol     Assessment/Plan   69 y/o female with CAD s/p recent CABG 06/2014 at OSH, normal LV function on recent 2D echo, admitted for worsening dyspnea in the setting of presumed failed HCAP. Cardiology consulted for 15 beat run of NSVT.   1. NSVT - from notes had 15 beat run of NSVT during this admit, coreg increased and ectopy improved - short 7 beat run noted overnight - will change to lopressor 37.5mg  bid since on midodrine as well  2. CAD - - hx of CABG 06/2014 - echo 07/2014 LVEF 48-25%, grade I diastolic dysfunction - no current chest pain symptoms  3. Pneumonia - abx per primary team  4. Orthostatic hypotension - lasix on hold - she has been on midodrine at home 10mg  tid, this has been continued - will change coreg  to metoprolol since she is on midodrine so as not to block the alpha receptor.  - she reports started on midodrine after her recent CABG surgery, had not had troubles prior. Would repeat orthostatics once she is rehydrated, not convinced she  requires this long term. She is hypertensive currently, if persists would decrease midodrine to 5mg  tid.   5. AKI - improving with IVF, continue to hold diuretics.    Carlyle Dolly, M.D.

## 2014-08-14 LAB — BASIC METABOLIC PANEL
ANION GAP: 5 (ref 5–15)
BUN: 10 mg/dL (ref 6–20)
CO2: 24 mmol/L (ref 22–32)
Calcium: 9.2 mg/dL (ref 8.9–10.3)
Chloride: 108 mmol/L (ref 101–111)
Creatinine, Ser: 1.09 mg/dL — ABNORMAL HIGH (ref 0.44–1.00)
GFR calc Af Amer: 58 mL/min — ABNORMAL LOW (ref >60)
GFR, EST NON AFRICAN AMERICAN: 50 mL/min — AB (ref >60)
Glucose, Bld: 128 mg/dL — ABNORMAL HIGH (ref 65–99)
Potassium: 4.3 mmol/L (ref 3.5–5.1)
Sodium: 137 mmol/L (ref 135–145)

## 2014-08-14 LAB — GLUCOSE, CAPILLARY
GLUCOSE-CAPILLARY: 116 mg/dL — AB (ref 65–99)
GLUCOSE-CAPILLARY: 145 mg/dL — AB (ref 65–99)
GLUCOSE-CAPILLARY: 106 mg/dL — AB (ref 65–99)
Glucose-Capillary: 144 mg/dL — ABNORMAL HIGH (ref 65–99)

## 2014-08-14 LAB — CBC
HEMATOCRIT: 33.9 % — AB (ref 36.0–46.0)
Hemoglobin: 11.5 g/dL — ABNORMAL LOW (ref 12.0–15.0)
MCH: 29.6 pg (ref 26.0–34.0)
MCHC: 33.9 g/dL (ref 30.0–36.0)
MCV: 87.4 fL (ref 78.0–100.0)
Platelets: 217 10*3/uL (ref 150–400)
RBC: 3.88 MIL/uL (ref 3.87–5.11)
RDW: 13.4 % (ref 11.5–15.5)
WBC: 5 10*3/uL (ref 4.0–10.5)

## 2014-08-14 LAB — MAGNESIUM: Magnesium: 1.7 mg/dL (ref 1.7–2.4)

## 2014-08-14 MED ORDER — METOCLOPRAMIDE HCL 5 MG/ML IJ SOLN
10.0000 mg | Freq: Four times a day (QID) | INTRAMUSCULAR | Status: DC | PRN
  Administered 2014-08-14 – 2014-08-16 (×2): 10 mg via INTRAVENOUS
  Filled 2014-08-14 (×4): qty 2

## 2014-08-14 MED ORDER — MAGNESIUM SULFATE 2 GM/50ML IV SOLN
2.0000 g | Freq: Once | INTRAVENOUS | Status: AC
  Administered 2014-08-14: 2 g via INTRAVENOUS
  Filled 2014-08-14: qty 50

## 2014-08-14 NOTE — Progress Notes (Signed)
Patient ID: Maria Fox, female   DOB: [birth year], 69 y.o.   MRN: [id number]     Subjective:    Nauseous this AM. No SOB, no chest pain  Objective:   Temp:  [97.5 F (36.4 C)-97.9 F (36.6 C)] 97.9 F (36.6 C) (06/05 9417) Pulse Rate:  [72-85] 85 (06/05 0632) Resp:  [18] 18 (06/05 4081) BP: (143-187)/(81-91) 143/81 mmHg (06/05 0632) SpO2:  [96 %-98 %] 96 % (06/05 4481) Weight:  [127 lb 6.9 oz (57.802 kg)] 127 lb 6.9 oz (57.802 kg) (06/05 0632) Last BM Date: 08/11/14  Filed Weights   08/12/14 0600 08/13/14 0512 08/14/14 8563  Weight: 126 lb 8 oz (57.38 kg) 127 lb 8 oz (57.834 kg) 127 lb 6.9 oz (57.802 kg)    Intake/Output Summary (Last 24 hours) at 08/14/14 0826 Last data filed at 08/14/14 0636  Gross per 24 hour  Intake 2006.25 ml  Output    802 ml  Net 1204.25 ml    Telemetry: NSR  Exam:  General: NAD  Resp: CTAB  Cardiac: RRR, no m/r/g, no JVD  GI: abdomen soft, NT, ND  MSK:no LE edema  Neuro: no focal deficits    Lab Results:  Basic Metabolic Panel:  Recent Labs Lab 08/10/14 0926  08/12/14 0530 08/13/14 0334 08/14/14 0435  NA 137  < > 136 138 137  K 4.9  < > 3.8 4.9 4.3  CL 101  < > 105 109 108  CO2 25  < > 22 22 24   GLUCOSE 163*  < > 111* 78 128*  BUN 20  < > 25* 15 10  CREATININE 1.41*  < > 1.65* 1.12* 1.09*  CALCIUM 10.1  < > 9.1 9.1 9.2  MG 2.0  --  2.0  --  1.7  < > = values in this interval not displayed.  Liver Function Tests:  Recent Labs Lab 08/07/14 1344 08/09/14 0504  AST 22 22  ALT 14 14  ALKPHOS 82 72  BILITOT 0.6 0.9  PROT 8.0 7.3  ALBUMIN 3.8 3.5    CBC:  Recent Labs Lab 08/12/14 0530 08/13/14 0334 08/14/14 0435  WBC 6.9 6.7 5.0  HGB 11.6* 11.7* 11.5*  HCT 34.2* 34.7* 33.9*  MCV 87.7 87.4 87.4  PLT 222 221 217    Cardiac Enzymes: No results for input(s): CKTOTAL, CKMB, CKMBINDEX, TROPONINI in the last 168 hours.  BNP: No results for input(s): PROBNP in the last 8760 hours.  Coagulation: No  results for input(s): INR in the last 168 hours.  ECG:   Medications:   Scheduled Medications: . aspirin EC  81 mg Oral Daily  . atorvastatin  20 mg Oral QHS  . clopidogrel  75 mg Oral Daily  . enoxaparin (LOVENOX) injection  30 mg Subcutaneous QHS  . famotidine  20 mg Oral QHS  . feeding supplement (ENSURE ENLIVE)  237 mL Oral BID BM  . ferrous sulfate  325 mg Oral Daily  . folic acid  1 mg Oral Daily  . gabapentin  100 mg Oral TID  . insulin aspart  0-9 Units Subcutaneous TID WC  . levofloxacin  750 mg Oral Q48H  . linagliptin  5 mg Oral Daily  . metoprolol tartrate  37.5 mg Oral BID  . midodrine  10 mg Oral TID WC  . polyethylene glycol  17 g Oral BID  . potassium chloride  40 mEq Oral Daily  . senna  2 tablet Oral QHS  . sertraline  50 mg  Oral Daily  . sodium chloride  3 mL Intravenous Q12H     Infusions: . sodium chloride Stopped (08/14/14 0824)     PRN Medications:  acetaminophen, hydrocortisone cream, metoCLOPramide, promethazine **OR** promethazine, traMADol     Assessment/Plan   69 y/o female with CAD s/p recent CABG 06/2014 at OSH, normal LV function on recent 2D echo, admitted for worsening dyspnea in the setting of presumed failed HCAP. Cardiology consulted for 15 beat run of NSVT.   1. NSVT - from notes had 15 beat run of NSVT during this admit, coreg increased and ectopy improved - yesterday changed to lopressor 37.5mg  bid since on midodrine as well - K and Mg ok today  - no NSVT overnight  2. CAD - - hx of CABG 06/2014 - echo 07/2014 LVEF 62-94%, grade I diastolic dysfunction - no current chest pain symptoms  3. Pneumonia - abx per primary team  4. Orthostatic hypotension - lasix on hold - she has been on midodrine at home 10mg  tid, this has been continued - changed coreg to metoprolol since she is on midodrine so as not to block the alpha receptor.  - she reports started on midodrine after her recent CABG surgery, had not had troubles  prior.  - she has supine HTN consistent with midodrine. Orthostatics yesterday remain positive, she has orthostatic symptoms, would continue midodrine. As her volume status improves off lasix would repeat orthostatics.   5. AKI - improving with IVF, continue to hold diuretics.         Carlyle Dolly, M.D.

## 2014-08-14 NOTE — Progress Notes (Signed)
[facility] PROGRESS NOTE  Maria Fox IEP:[id number] DOB: 05/08/1945 DOA: 08/07/2014 PCP: [name], MD  Assessment/Plan: #1 sepsis secondary to healthcare associated pneumonia and probable parapneumonic effusion Clinical improvement. Per light's criteria effusion exudative in nature.Chest x-ray with improvement. Blood cultures pending. Body fluid cultures negative. Patient has been transitioned from IV antibodies to oral Levaquin to complete a two-week course of antibiotic therapy. Follow.  #2 nonsustained V. tach Patient noted to have 15 beat run of nonsustained V. tach during this hospitalization. Patient noted to have a 7 beat run of nonsustained V. tach overnight. Patient denied any chest pain. Magnesium at 2. Potassium is at 4.6. Coreg dose was increased to 6.25 mg twice a day. Coreg has been changed to lopressor per cardiology as patient on midodrine. TSH within normal limits at 3.353. 2-D echo with EF of 60-65% with no wall motion abnormalities and grade 1 diastolic dysfunction. Patient has been seen in consultation by cardiology and no further workup needed at this time. Follow.   #3 orthostatic hypotension/autonomic dysfunction secondary to diabetes Continue Midodrine. Continue to hold Lasix. Continue TED hose. Coreg has been changed to Lopressor per cardiology. Saline lock IV fluids.   #4 acute diastolic heart failure Patient was on Lasix 40 mg IV daily. Patient with orthostasis and complained of dizziness and a such diuretics were discontinued. Creatinine trending back down. Saline lock IV fluids.   #5 type 2 diabetes Hemoglobin A1c was 8.2 on 06/2014. Patient also with a history of diabetic gastroparesis and neuropathy. Continue sliding scale insulin. Continue linagliptin.  #6 recent CABG 3 in [place] Stable. Continue aspirin, Plavix. Patient was unable to tolerate ACE inhibitor secondary to hypotension. Patient currently tolerating Coreg. Monitor for now.  #7  history of CVA Continue aspirin and Plavix for secondary stroke prevention.  #8 anemia H&H stable. Continue iron sulfate.  #9 right upper quadrant abdominal pain/cirrhosis/hep C HCV RNA genotype ordered pending. Will need to follow-up with the ID clinic as outpatient as well as GI.  #10 vitiligo  #11 moderate protein calorie malnutrition Stable.   #12 prophylaxis Pepcid for GI prophylaxis. Lovenox for DVT prophylaxis.    Code Status: Full Family Communication: updated patient and husband at bedside. Disposition Plan: Home when medically stable and no longer symptomatic from orthostatics.   Consultants:  Cardiology: Dr Irish Lack  08/10/14   Procedures:  CT angiogram chest 08/07/2014  CT head without contrast 08/07/2014  Chest x-ray 08/07/2014, 08/08/2014 08/09/2014,  2-D echo 08/08/2014   ultrasound-guided thoracentesis 08/08/2014 300 mL removed  Antibiotics:  IV vancomycin 08/07/2014 >>>> 08/09/2014    IV cefepime 08/07/2014>>>>> 08/09/2014  Oral Levaquin 08/11/2014  HPI/Subjective: Patient complaining of headache. Patient states no significant improvement with dizziness with positional changes.  Objective: Filed Vitals:   08/14/14 0632  BP: 143/81  Pulse: 85  Temp: 97.9 F (36.6 C)  Resp: 18    Intake/Output Summary (Last 24 hours) at 08/14/14 1406 Last data filed at 08/14/14 1244  Gross per 24 hour  Intake 1886.25 ml  Output   1203 ml  Net 683.25 ml   Filed Weights   08/12/14 0600 08/13/14 0512 08/14/14 0632  Weight: 57.38 kg (126 lb 8 oz) 57.834 kg (127 lb 8 oz) 57.802 kg (127 lb 6.9 oz)    Exam:   General:  nad  Cardiovascular: RRR  Respiratory: CTAB  Abdomen: soft, nontender, nondistended, positive bowel sounds.  Musculoskeletal: no clubbing cyanosis or edema.  Data Reviewed: Basic Metabolic Panel:  Recent Labs Lab 08/09/14  8119 08/10/14 0926 08/11/14 0433 08/12/14 0530 08/13/14 0334 08/14/14 0435  NA 133* 137 135 136  138 137  K 4.6 4.9 4.6 3.8 4.9 4.3  CL 102 101 100* 105 109 108  CO2 23 25 23 22 22 24   GLUCOSE 159* 163* 126* 111* 78 128*  BUN 20 20 26* 25* 15 10  CREATININE 1.28* 1.41* 1.96* 1.65* 1.12* 1.09*  CALCIUM 9.4 10.1 9.7 9.1 9.1 9.2  MG 1.8 2.0  --  2.0  --  1.7   Liver Function Tests:  Recent Labs Lab 08/09/14 0504  AST 22  ALT 14  ALKPHOS 72  BILITOT 0.9  PROT 7.3  ALBUMIN 3.5    Recent Labs Lab 08/07/14 1433  LIPASE 34   No results for input(s): AMMONIA in the last 168 hours. CBC:  Recent Labs Lab 08/09/14 0504 08/11/14 0433 08/12/14 0530 08/13/14 0334 08/14/14 0435  WBC 7.3 10.0 6.9 6.7 5.0  NEUTROABS 3.9  --   --   --   --   HGB 12.8 13.4 11.6* 11.7* 11.5*  HCT 37.8 40.0 34.2* 34.7* 33.9*  MCV 87.3 88.5 87.7 87.4 87.4  PLT 279 299 222 221 217   Cardiac Enzymes: No results for input(s): CKTOTAL, CKMB, CKMBINDEX, TROPONINI in the last 168 hours. BNP (last 3 results)  Recent Labs  07/07/14 2210 08/07/14 1344  BNP 125.5* 297.8*    ProBNP (last 3 results) No results for input(s): PROBNP in the last 8760 hours.  CBG:  Recent Labs Lab 08/13/14 1137 08/13/14 1650 08/13/14 2151 08/14/14 0628 08/14/14 1213  GLUCAP 132* 126* 139* 116* 145*    Recent Results (from the past 240 hour(s))  Culture, blood (routine x 2)     Status: None   Collection Time: 08/07/14  5:35 PM  Result Value Ref Range Status   Specimen Description BLOOD RIGHT ARM  Final   Special Requests BOTTLES DRAWN AEROBIC AND ANAEROBIC 5CC  Final   Culture   Final    NO GROWTH 5 DAYS Performed at [facility]    Report Status 08/13/2014 FINAL  Final  Culture, blood (routine x 2)     Status: None   Collection Time: 08/07/14  5:46 PM  Result Value Ref Range Status   Specimen Description BLOOD RIGHT WRIST  Final   Special Requests BOTTLES DRAWN AEROBIC AND ANAEROBIC 5CC  Final   Culture   Final    NO GROWTH 5 DAYS Performed at [facility]    Report Status  08/13/2014 FINAL  Final  Body fluid culture     Status: None   Collection Time: 08/08/14 10:37 AM  Result Value Ref Range Status   Specimen Description FLUID PLEURAL LEFT  Final   Special Requests NONE  Final   Gram Stain   Final    NO WBC SEEN NO ORGANISMS SEEN Performed at [facility]    Culture   Final    NO GROWTH 3 DAYS Performed at [facility]    Report Status 08/11/2014 FINAL  Final     Studies: No results found.  Scheduled Meds: . aspirin EC  81 mg Oral Daily  . atorvastatin  20 mg Oral QHS  . clopidogrel  75 mg Oral Daily  . enoxaparin (LOVENOX) injection  30 mg Subcutaneous QHS  . famotidine  20 mg Oral QHS  . feeding supplement (ENSURE ENLIVE)  237 mL Oral BID BM  . ferrous sulfate  325 mg Oral Daily  .  folic acid  1 mg Oral Daily  . gabapentin  100 mg Oral TID  . insulin aspart  0-9 Units Subcutaneous TID WC  . levofloxacin  750 mg Oral Q48H  . linagliptin  5 mg Oral Daily  . magnesium sulfate 1 - 4 g bolus IVPB  2 g Intravenous Once  . metoprolol tartrate  37.5 mg Oral BID  . midodrine  10 mg Oral TID WC  . polyethylene glycol  17 g Oral BID  . potassium chloride  40 mEq Oral Daily  . senna  2 tablet Oral QHS  . sertraline  50 mg Oral Daily  . sodium chloride  3 mL Intravenous Q12H   Continuous Infusions:    Principal Problem:   HCAP (healthcare-associated pneumonia) Active Problems:   MDD (major depressive disorder), recurrent episode, moderate   HYPERTENSION, BENIGN SYSTEMIC   S/P CABG x 3, April 16   Autonomic orthostatic hypotension   Protein-calorie malnutrition   Pleural effusion on left   Malnutrition of moderate degree   Pleural effusion   Pleural effusion, left   NSVT (nonsustained ventricular tachycardia)    Time spent: 35 minutes    [facility] MD [facility] Pager [phone]. If 7PM-7AM, please contact night-coverage at [email or web address], password [facility] 08/14/2014, 2:06 PM  LOS: 7 days

## 2014-08-15 ENCOUNTER — Other Ambulatory Visit: Payer: Self-pay

## 2014-08-15 LAB — CBC
HCT: 38.6 % (ref 36.0–46.0)
Hemoglobin: 13.3 g/dL (ref 12.0–15.0)
MCH: 29.7 pg (ref 26.0–34.0)
MCHC: 34.5 g/dL (ref 30.0–36.0)
MCV: 86.2 fL (ref 78.0–100.0)
Platelets: 263 10*3/uL (ref 150–400)
RBC: 4.48 MIL/uL (ref 3.87–5.11)
RDW: 13.4 % (ref 11.5–15.5)
WBC: 7.3 10*3/uL (ref 4.0–10.5)

## 2014-08-15 LAB — MAGNESIUM: MAGNESIUM: 2 mg/dL (ref 1.7–2.4)

## 2014-08-15 LAB — BASIC METABOLIC PANEL
Anion gap: 9 (ref 5–15)
BUN: 8 mg/dL (ref 6–20)
CALCIUM: 9.6 mg/dL (ref 8.9–10.3)
CHLORIDE: 105 mmol/L (ref 101–111)
CO2: 22 mmol/L (ref 22–32)
Creatinine, Ser: 1.02 mg/dL — ABNORMAL HIGH (ref 0.44–1.00)
GFR calc Af Amer: >60 mL/min (ref >60)
GFR, EST NON AFRICAN AMERICAN: 54 mL/min — AB (ref >60)
Glucose, Bld: 99 mg/dL (ref 65–99)
Potassium: 4.5 mmol/L (ref 3.5–5.1)
Sodium: 136 mmol/L (ref 135–145)

## 2014-08-15 LAB — GLUCOSE, CAPILLARY
GLUCOSE-CAPILLARY: 111 mg/dL — AB (ref 65–99)
Glucose-Capillary: 158 mg/dL — ABNORMAL HIGH (ref 65–99)
Glucose-Capillary: 119 mg/dL — ABNORMAL HIGH (ref 65–99)
Glucose-Capillary: 104 mg/dL — ABNORMAL HIGH (ref 65–99)

## 2014-08-15 MED ORDER — ENSURE ENLIVE PO LIQD
237.0000 mL | Freq: Three times a day (TID) | ORAL | Status: DC
  Administered 2014-08-15 – 2014-08-22 (×18): 237 mL via ORAL

## 2014-08-15 MED ORDER — METOPROLOL TARTRATE 25 MG PO TABS
25.0000 mg | ORAL_TABLET | Freq: Two times a day (BID) | ORAL | Status: DC
  Administered 2014-08-15 – 2014-08-20 (×10): 25 mg via ORAL
  Filled 2014-08-15 (×11): qty 1

## 2014-08-15 MED ORDER — MECLIZINE HCL 25 MG PO TABS
25.0000 mg | ORAL_TABLET | Freq: Three times a day (TID) | ORAL | Status: DC | PRN
  Administered 2014-08-15: 25 mg via ORAL
  Filled 2014-08-15 (×2): qty 1

## 2014-08-15 MED ORDER — HYDROXYZINE HCL 10 MG PO TABS
10.0000 mg | ORAL_TABLET | Freq: Three times a day (TID) | ORAL | Status: DC | PRN
  Administered 2014-08-15 (×2): 10 mg via ORAL
  Filled 2014-08-15 (×4): qty 1

## 2014-08-15 NOTE — Patient Outreach (Signed)
Unsuccessful attempt made to contact patient via telephone. In reviewing EPIC, patient remain hospitalized with no noted disposition as of this date, with the exception of, will discharge when medically stable.  Plan: Check on disposition on 08/17/2014

## 2014-08-15 NOTE — Progress Notes (Signed)
[facility] PROGRESS NOTE  Maria Fox KDX:[id number] DOB: October 23, 1945 DOA: 08/07/2014 PCP: [name], MD  Assessment/Plan: #1 sepsis secondary to healthcare associated pneumonia and probable parapneumonic effusion Clinical improvement. Per light's criteria effusion exudative in nature.Chest x-ray with improvement. Blood cultures pending. Body fluid cultures negative. Continue oral Levaquin to complete a 10 day course of antibiotic therapy. Follow.  #2 nonsustained V. tach Patient noted to have 15 beat run of nonsustained V. tach during this hospitalization. Patient noted to have a 7 beat run of nonsustained V. tach overnight. Patient denied any chest pain. Magnesium at 2. Potassium is at 4.6. Coreg dose was increased to 6.25 mg twice a day. Coreg has been changed to lopressor per cardiology as patient on midodrine. TSH within normal limits at 3.353. 2-D echo with EF of 60-65% with no wall motion abnormalities and grade 1 diastolic dysfunction. Patient has been seen in consultation by cardiology and no further workup needed at this time. Follow.   #3 orthostatic hypotension/autonomic dysfunction secondary to diabetes Continue Midodrine. Continue to hold Lasix. Continue TED hose. Coreg has been changed to Lopressor per cardiology. Saline lock IV fluids. Lopressor dose has been decreased by cardiology. Will try some meclizine when necessary.  #4 acute diastolic heart failure Patient was on Lasix 40 mg IV daily. Patient with orthostasis and complained of dizziness and as such diuretics were discontinued. Creatinine trending back down. Saline lock IV fluids.   #5 type 2 diabetes Hemoglobin A1c was 8.2 on 06/2014. Patient also with a history of diabetic gastroparesis and neuropathy. Continue sliding scale insulin. Continue linagliptin.  #6 recent CABG 3 in [place] Stable. Continue aspirin, Plavix. Patient was unable to tolerate ACE inhibitor secondary to hypotension. Patient currently  tolerating Coreg. Monitor for now.  #7 history of CVA Continue aspirin and Plavix for secondary stroke prevention.  #8 anemia H&H stable. Continue iron sulfate.  #9 right upper quadrant abdominal pain/cirrhosis/hep C HCV RNA genotype ordered pending. Will need to follow-up with the ID clinic as outpatient as well as GI.  #10 vitiligo  #11 moderate protein calorie malnutrition Stable.   #12 prophylaxis Pepcid for GI prophylaxis. Lovenox for DVT prophylaxis.    Code Status: Full Family Communication: updated patient and husband at bedside. Disposition Plan: Home when medically stable and no longer symptomatic from orthostatics.   Consultants:  Cardiology: Dr Irish Lack  08/10/14   Procedures:  CT angiogram chest 08/07/2014  CT head without contrast 08/07/2014  Chest x-ray 08/07/2014, 08/08/2014 08/09/2014,  2-D echo 08/08/2014   ultrasound-guided thoracentesis 08/08/2014 300 mL removed  Antibiotics:  IV vancomycin 08/07/2014 >>>> 08/09/2014    IV cefepime 08/07/2014>>>>> 08/09/2014  Oral Levaquin 08/11/2014  HPI/Subjective: Patient complaining of headache. Patient c/o dizziness. Patient also c/o anxiety.  Objective: Filed Vitals:   08/15/14 1441  BP: 148/64  Pulse: 59  Temp: 97.6 F (36.4 C)  Resp: 18    Intake/Output Summary (Last 24 hours) at 08/15/14 1517 Last data filed at 08/15/14 0900  Gross per 24 hour  Intake     60 ml  Output    800 ml  Net   -740 ml   Filed Weights   08/13/14 0512 08/14/14 0632 08/15/14 0526  Weight: 57.834 kg (127 lb 8 oz) 57.802 kg (127 lb 6.9 oz) 56.291 kg (124 lb 1.6 oz)    Exam:   General:  nad  Cardiovascular: RRR  Respiratory: CTAB  Abdomen: soft, nontender, nondistended, positive bowel sounds.  Musculoskeletal: no clubbing cyanosis or edema.  Data Reviewed: Basic Metabolic Panel:  Recent Labs Lab 08/09/14 0504 08/10/14 0926 08/11/14 0433 08/12/14 0530 08/13/14 0334 08/14/14 0435  08/15/14 0237  NA 133* 137 135 136 138 137 136  K 4.6 4.9 4.6 3.8 4.9 4.3 4.5  CL 102 101 100* 105 109 108 105  CO2 23 25 23 22 22 24 22   GLUCOSE 159* 163* 126* 111* 78 128* 99  BUN 20 20 26* 25* 15 10 8   CREATININE 1.28* 1.41* 1.96* 1.65* 1.12* 1.09* 1.02*  CALCIUM 9.4 10.1 9.7 9.1 9.1 9.2 9.6  MG 1.8 2.0  --  2.0  --  1.7 2.0   Liver Function Tests:  Recent Labs Lab 08/09/14 0504  AST 22  ALT 14  ALKPHOS 72  BILITOT 0.9  PROT 7.3  ALBUMIN 3.5   No results for input(s): LIPASE, AMYLASE in the last 168 hours. No results for input(s): AMMONIA in the last 168 hours. CBC:  Recent Labs Lab 08/09/14 0504 08/11/14 0433 08/12/14 0530 08/13/14 0334 08/14/14 0435 08/15/14 0237  WBC 7.3 10.0 6.9 6.7 5.0 7.3  NEUTROABS 3.9  --   --   --   --   --   HGB 12.8 13.4 11.6* 11.7* 11.5* 13.3  HCT 37.8 40.0 34.2* 34.7* 33.9* 38.6  MCV 87.3 88.5 87.7 87.4 87.4 86.2  PLT 279 299 222 221 217 263   Cardiac Enzymes: No results for input(s): CKTOTAL, CKMB, CKMBINDEX, TROPONINI in the last 168 hours. BNP (last 3 results)  Recent Labs  07/07/14 2210 08/07/14 1344  BNP 125.5* 297.8*    ProBNP (last 3 results) No results for input(s): PROBNP in the last 8760 hours.  CBG:  Recent Labs Lab 08/14/14 1213 08/14/14 1700 08/14/14 2057 08/15/14 0600 08/15/14 1045  GLUCAP 145* 144* 106* 111* 158*    Recent Results (from the past 240 hour(s))  Culture, blood (routine x 2)     Status: None   Collection Time: 08/07/14  5:35 PM  Result Value Ref Range Status   Specimen Description BLOOD RIGHT ARM  Final   Special Requests BOTTLES DRAWN AEROBIC AND ANAEROBIC 5CC  Final   Culture   Final    NO GROWTH 5 DAYS Performed at [facility]    Report Status 08/13/2014 FINAL  Final  Culture, blood (routine x 2)     Status: None   Collection Time: 08/07/14  5:46 PM  Result Value Ref Range Status   Specimen Description BLOOD RIGHT WRIST  Final   Special Requests BOTTLES DRAWN  AEROBIC AND ANAEROBIC 5CC  Final   Culture   Final    NO GROWTH 5 DAYS Performed at [facility]    Report Status 08/13/2014 FINAL  Final  Body fluid culture     Status: None   Collection Time: 08/08/14 10:37 AM  Result Value Ref Range Status   Specimen Description FLUID PLEURAL LEFT  Final   Special Requests NONE  Final   Gram Stain   Final    NO WBC SEEN NO ORGANISMS SEEN Performed at [facility]    Culture   Final    NO GROWTH 3 DAYS Performed at [facility]    Report Status 08/11/2014 FINAL  Final     Studies: No results found.  Scheduled Meds: . aspirin EC  81 mg Oral Daily  . atorvastatin  20 mg Oral QHS  . clopidogrel  75 mg Oral Daily  . enoxaparin (LOVENOX) injection  30 mg Subcutaneous  QHS  . famotidine  20 mg Oral QHS  . feeding supplement (ENSURE ENLIVE)  237 mL Oral TID BM  . ferrous sulfate  325 mg Oral Daily  . folic acid  1 mg Oral Daily  . gabapentin  100 mg Oral TID  . insulin aspart  0-9 Units Subcutaneous TID WC  . levofloxacin  750 mg Oral Q48H  . linagliptin  5 mg Oral Daily  . metoprolol tartrate  25 mg Oral BID  . midodrine  10 mg Oral TID WC  . polyethylene glycol  17 g Oral BID  . potassium chloride  40 mEq Oral Daily  . senna  2 tablet Oral QHS  . sertraline  50 mg Oral Daily  . sodium chloride  3 mL Intravenous Q12H   Continuous Infusions:    Principal Problem:   HCAP (healthcare-associated pneumonia) Active Problems:   MDD (major depressive disorder), recurrent episode, moderate   HYPERTENSION, BENIGN SYSTEMIC   S/P CABG x 3, April 16   Autonomic orthostatic hypotension   Protein-calorie malnutrition   Pleural effusion on left   Malnutrition of moderate degree   Pleural effusion   Pleural effusion, left   NSVT (nonsustained ventricular tachycardia)    Time spent: 35 minutes    [facility] MD [facility] Pager [phone]. If 7PM-7AM, please contact night-coverage at [email or web address],  password [facility] 08/15/2014, 3:17 PM  LOS: 8 days

## 2014-08-15 NOTE — Progress Notes (Signed)
Nutrition Follow-up  DOCUMENTATION CODES:  Non-severe (moderate) malnutrition in context of chronic illness  INTERVENTION:  Ensure Enlive (each supplement provides 350kcal and 20 grams of protein) TID  NUTRITION DIAGNOSIS:  Malnutrition related to chronic illness as evidenced by mild depletion of body fat, mild depletion of muscle mass.  Ongoing  GOAL:  Patient will meet greater than or equal to 90% of their needs  Unmet  MONITOR:  PO intake, Labs, Weight trends, I & O's  REASON FOR ASSESSMENT:  Malnutrition Screening Tool    ASSESSMENT: Pt is a 69 y.o. female with PMH of HTN, DM, CVA, CAD s/p CABG (08/2945) complicated with HCAP (treated 4/28-5/9) presented with worsening SOB, associated with mild productive cough, chills no objective fever. Patient also reports DOE, PNDs and orthopnea.  Pt reports that her appetite has been poor the past few days and she is eating very little. Per nursing notes pt has been eating 0-25% of most meals. Pt reports liking Ensure and drinking it twice daily and is willing to drink it more often. Pt's weight has dropped another 2 lbs in the past week.   Labs reviewed.   Height:  Ht Readings from Last 1 Encounters:  08/07/14 5\' 3"  (1.6 m)    Weight:  Wt Readings from Last 1 Encounters:  08/15/14 124 lb 1.6 oz (56.291 kg)    Ideal Body Weight:  52 kg  Wt Readings from Last 10 Encounters:  08/15/14 124 lb 1.6 oz (56.291 kg)  07/21/14 131 lb 4 oz (59.535 kg)  07/17/14 134 lb 3.2 oz (60.873 kg)  03/28/14 141 lb (63.957 kg)  09/17/13 148 lb (67.132 kg)  06/02/12 139 lb 8.8 oz (63.3 kg)  05/01/12 144 lb 8 oz (65.545 kg)  04/18/12 142 lb (64.411 kg)  04/17/12 142 lb 12 oz (64.751 kg)  04/06/12 142 lb 8 oz (64.638 kg)    BMI:  Body mass index is 21.99 kg/(m^2).  Estimated Nutritional Needs:  Kcal:  1400 - 1600  Protein:  65 - 80g  Fluid:  > 1.5 L  Skin:  Reviewed, no issues  Diet Order:  Diet heart healthy/carb modified  Room service appropriate?: Yes; Fluid consistency:: Thin  EDUCATION NEEDS:  Education needs addressed   Intake/Output Summary (Last 24 hours) at 08/15/14 1400 Last data filed at 08/15/14 0900  Gross per 24 hour  Intake     60 ml  Output    800 ml  Net   -740 ml    Last BM:  6/2  Pryor Ochoa RD, LDN Inpatient Clinical Dietitian Pager: [phone] After Hours Pager: [phone]

## 2014-08-15 NOTE — Progress Notes (Addendum)
Patient ID: Maria Fox, female   DOB: 08-Apr-1945, 69 y.o.   MRN: [id number]     Subjective:    No SOB, no chest pain; mild dizziness with standing  Objective:   Temp:  [97.9 F (36.6 C)-98.3 F (36.8 C)] 98.3 F (36.8 C) (06/06 0526) Pulse Rate:  [66-70] 66 (06/06 0526) Resp:  [16-18] 17 (06/06 0526) BP: (127-153)/(70-96) 127/70 mmHg (06/06 0526) SpO2:  [96 %-97 %] 97 % (06/06 0526) Weight:  [124 lb 1.6 oz (56.291 kg)] 124 lb 1.6 oz (56.291 kg) (06/06 0526) Last BM Date: 08/11/14  Filed Weights   08/13/14 0512 08/14/14 1657 08/15/14 0526  Weight: 127 lb 8 oz (57.834 kg) 127 lb 6.9 oz (57.802 kg) 124 lb 1.6 oz (56.291 kg)    Intake/Output Summary (Last 24 hours) at 08/15/14 1226 Last data filed at 08/15/14 0600  Gross per 24 hour  Intake      0 ml  Output   1201 ml  Net  -1201 ml    Telemetry: NSR  Exam:  General: WD/WN, NAD  HEENT: normal   Neck: supple  Resp: diminished BS LLL  Cardiac: RRR  GI: abdomen soft, NT, ND  MSK:no LE edema  Neuro: no focal deficits    Lab Results:  Basic Metabolic Panel:  Recent Labs Lab 08/12/14 0530 08/13/14 0334 08/14/14 0435 08/15/14 0237  NA 136 138 137 136  K 3.8 4.9 4.3 4.5  CL 105 109 108 105  CO2 22 22 24 22   GLUCOSE 111* 78 128* 99  BUN 25* 15 10 8   CREATININE 1.65* 1.12* 1.09* 1.02*  CALCIUM 9.1 9.1 9.2 9.6  MG 2.0  --  1.7 2.0    Liver Function Tests:  Recent Labs Lab 08/09/14 0504  AST 22  ALT 14  ALKPHOS 72  BILITOT 0.9  PROT 7.3  ALBUMIN 3.5    CBC:  Recent Labs Lab 08/13/14 0334 08/14/14 0435 08/15/14 0237  WBC 6.7 5.0 7.3  HGB 11.7* 11.5* 13.3  HCT 34.7* 33.9* 38.6  MCV 87.4 87.4 86.2  PLT 221 217 263      Medications:   Scheduled Medications: . aspirin EC  81 mg Oral Daily  . atorvastatin  20 mg Oral QHS  . clopidogrel  75 mg Oral Daily  . enoxaparin (LOVENOX) injection  30 mg Subcutaneous QHS  . famotidine  20 mg Oral QHS  . feeding supplement (ENSURE ENLIVE)   237 mL Oral BID BM  . ferrous sulfate  325 mg Oral Daily  . folic acid  1 mg Oral Daily  . gabapentin  100 mg Oral TID  . insulin aspart  0-9 Units Subcutaneous TID WC  . levofloxacin  750 mg Oral Q48H  . linagliptin  5 mg Oral Daily  . metoprolol tartrate  37.5 mg Oral BID  . midodrine  10 mg Oral TID WC  . polyethylene glycol  17 g Oral BID  . potassium chloride  40 mEq Oral Daily  . senna  2 tablet Oral QHS  . sertraline  50 mg Oral Daily  . sodium chloride  3 mL Intravenous Q12H    Infusions:    PRN Medications: acetaminophen, hydrocortisone cream, metoCLOPramide (REGLAN) injection, metoCLOPramide, promethazine **OR** promethazine, traMADol     Assessment/Plan   69 y/o female with CAD s/p recent CABG 06/2014 at OSH, normal LV function on recent 2D echo, admitted for worsening dyspnea in the setting of presumed HCAP. Cardiology consulted for 15 beat run of  NSVT.   1. NSVT - from notes had 15 beat run of NSVT during this admit - no NSVT overnight - change lopressor to 25 mg BID  2. CAD - hx of CABG 06/2014 - echo 07/2014 LVEF 43-27%, grade I diastolic dysfunction - no current chest pain symptoms -continue ASA and statin  3. Pneumonia - abx per primary team  4. Orthostatic hypotension -continue midodrine  5. AKI - improving; lasix held   We will sign off; please call with questions  Per Dr Irish Lack, patient had requested and been scheduled to see Dr Radford Pax as outpatient; would FU as scheduled.   Kirk Ruths, M.D.

## 2014-08-16 LAB — BASIC METABOLIC PANEL
Anion gap: 10 (ref 5–15)
BUN: 14 mg/dL (ref 6–20)
CALCIUM: 9.3 mg/dL (ref 8.9–10.3)
CO2: 23 mmol/L (ref 22–32)
CREATININE: 1.24 mg/dL — AB (ref 0.44–1.00)
Chloride: 101 mmol/L (ref 101–111)
GFR calc Af Amer: 49 mL/min — ABNORMAL LOW (ref >60)
GFR calc non Af Amer: 43 mL/min — ABNORMAL LOW (ref >60)
GLUCOSE: 147 mg/dL — AB (ref 65–99)
Potassium: 3.9 mmol/L (ref 3.5–5.1)
SODIUM: 134 mmol/L — AB (ref 135–145)

## 2014-08-16 LAB — GLUCOSE, CAPILLARY
Glucose-Capillary: 144 mg/dL — ABNORMAL HIGH (ref 65–99)
Glucose-Capillary: 170 mg/dL — ABNORMAL HIGH (ref 65–99)
Glucose-Capillary: 102 mg/dL — ABNORMAL HIGH (ref 65–99)
Glucose-Capillary: 98 mg/dL (ref 65–99)

## 2014-08-16 MED ORDER — LORAZEPAM 0.5 MG PO TABS
0.5000 mg | ORAL_TABLET | Freq: Two times a day (BID) | ORAL | Status: DC | PRN
  Administered 2014-08-18 – 2014-08-21 (×2): 0.5 mg via ORAL
  Filled 2014-08-16 (×2): qty 1

## 2014-08-16 MED ORDER — SODIUM CHLORIDE 0.9 % IV BOLUS (SEPSIS): 250.0000 mL | Freq: Once | INTRAVENOUS | Status: DC

## 2014-08-16 MED ORDER — MECLIZINE HCL 25 MG PO TABS
25.0000 mg | ORAL_TABLET | Freq: Three times a day (TID) | ORAL | Status: DC
  Administered 2014-08-16 – 2014-08-22 (×20): 25 mg via ORAL
  Filled 2014-08-16 (×21): qty 1

## 2014-08-16 MED ORDER — SODIUM CHLORIDE 0.9 % IV BOLUS (SEPSIS)
500.0000 mL | Freq: Once | INTRAVENOUS | Status: AC
  Administered 2014-08-16: 500 mL via INTRAVENOUS

## 2014-08-16 NOTE — Progress Notes (Signed)
Patient fell back on bed while nursing assistant personnel was obtaining orthostatics. Once patient was able to maintain focus on staff, patient was able to state that she was at [facility], who [name] her significant other was, and who her nurse was. MD notified and new orders were given. Will continue to monitor patient for further changes in condition.

## 2014-08-16 NOTE — Progress Notes (Signed)
[facility] PROGRESS NOTE  Maria Fox OFB:[id number] DOB: 10-18-1945 DOA: 08/07/2014 PCP: [name], MD  Assessment/Plan: #1 sepsis secondary to healthcare associated pneumonia and probable parapneumonic effusion Clinical improvement. Per light's criteria effusion exudative in nature.Chest x-ray with improvement. Blood cultures pending. Body fluid cultures negative. Discontinue Levaquin as patient has been adequately treated. Follow.  #2 nonsustained V. tach Patient noted to have 15 beat run of nonsustained V. tach during this hospitalization. Patient noted to have a 7 beat run of nonsustained V. tach overnight. Patient denied any chest pain. Magnesium at 2. Potassium is at 4.6. Coreg dose was increased to 6.25 mg twice a day. Coreg has been changed to lopressor per cardiology as patient on midodrine. TSH within normal limits at 3.353. 2-D echo with EF of 60-65% with no wall motion abnormalities and grade 1 diastolic dysfunction. Patient has been seen in consultation by cardiology and no further workup needed at this time. Follow.   #3 orthostatic hypotension/autonomic dysfunction secondary to diabetes Patient with significant dizziness with positional changes. Continue Midodrine. Continue to hold Lasix. Continue TED hose. Coreg has been changed to Lopressor per cardiology. Saline lock IV fluids. Lopressor dose has been decreased by cardiology. Will place patient on scheduled meclizine for the next one to 2 days. Will give a bolus of IV fluids. Will place a abdominal binder.   #4 acute diastolic heart failure Patient was on Lasix 40 mg IV daily. Patient with orthostasis and complained of dizziness and as such diuretics were discontinued. Creatinine trending back down. Saline lock IV fluids.   #5 type 2 diabetes Hemoglobin A1c was 8.2 on 06/2014. Patient also with a history of diabetic gastroparesis and neuropathy. Continue sliding scale insulin. Continue linagliptin.  #6 recent CABG  3 in [place] Stable. Continue aspirin, Plavix. Patient was unable to tolerate ACE inhibitor secondary to hypotension. Patient currently tolerating Coreg. Monitor for now.  #7 history of CVA Continue aspirin and Plavix for secondary stroke prevention.  #8 anemia H&H stable. Continue iron sulfate.  #9 right upper quadrant abdominal pain/cirrhosis/hep C HCV RNA genotype ordered pending. Will need to follow-up with the ID clinic as outpatient as well as GI.  #10 vitiligo  #11 moderate protein calorie malnutrition Stable.   #12 prophylaxis Pepcid for GI prophylaxis. Lovenox for DVT prophylaxis.    Code Status: Full Family Communication: updated patient and husband at bedside. Disposition Plan: Home when medically stable and no longer symptomatic from orthostatics.   Consultants:  Cardiology: Dr Irish Lack  08/10/14   Procedures:  CT angiogram chest 08/07/2014  CT head without contrast 08/07/2014  Chest x-ray 08/07/2014, 08/08/2014 08/09/2014,  2-D echo 08/08/2014   ultrasound-guided thoracentesis 08/08/2014 300 mL removed  Antibiotics:  IV vancomycin 08/07/2014 >>>> 08/09/2014    IV cefepime 08/07/2014>>>>> 08/09/2014  Oral Levaquin 08/11/2014>>>>> 08/16/2014  HPI/Subjective: Patient complaining of headache. Patient with Towel on her head. Patient complaining of dizziness, with positional changes.  Objective: Filed Vitals:   08/16/14 1456  BP: 140/97  Pulse: 65  Temp: 97.5 F (36.4 C)  Resp: 18    Intake/Output Summary (Last 24 hours) at 08/16/14 1458 Last data filed at 08/16/14 1046  Gross per 24 hour  Intake    600 ml  Output      0 ml  Net    600 ml   Filed Weights   08/14/14 0632 08/15/14 0526 08/16/14 0622  Weight: 57.802 kg (127 lb 6.9 oz) 56.291 kg (124 lb 1.6 oz) 55.52 kg (122 lb 6.4  oz)    Exam:   General:  nad  Cardiovascular: RRR  Respiratory: CTAB  Abdomen: soft, nontender, nondistended, positive bowel  sounds.  Musculoskeletal: no clubbing cyanosis or edema.  Data Reviewed: Basic Metabolic Panel:  Recent Labs Lab 08/10/14 0926  08/12/14 0530 08/13/14 0334 08/14/14 0435 08/15/14 0237 08/16/14 0941  NA 137  < > 136 138 137 136 134*  K 4.9  < > 3.8 4.9 4.3 4.5 3.9  CL 101  < > 105 109 108 105 101  CO2 25  < > 22 22 24 22 23   GLUCOSE 163*  < > 111* 78 128* 99 147*  BUN 20  < > 25* 15 10 8 14   CREATININE 1.41*  < > 1.65* 1.12* 1.09* 1.02* 1.24*  CALCIUM 10.1  < > 9.1 9.1 9.2 9.6 9.3  MG 2.0  --  2.0  --  1.7 2.0  --   < > = values in this interval not displayed. Liver Function Tests: No results for input(s): AST, ALT, ALKPHOS, BILITOT, PROT, ALBUMIN in the last 168 hours. No results for input(s): LIPASE, AMYLASE in the last 168 hours. No results for input(s): AMMONIA in the last 168 hours. CBC:  Recent Labs Lab 08/11/14 0433 08/12/14 0530 08/13/14 0334 08/14/14 0435 08/15/14 0237  WBC 10.0 6.9 6.7 5.0 7.3  HGB 13.4 11.6* 11.7* 11.5* 13.3  HCT 40.0 34.2* 34.7* 33.9* 38.6  MCV 88.5 87.7 87.4 87.4 86.2  PLT 299 222 221 217 263   Cardiac Enzymes: No results for input(s): CKTOTAL, CKMB, CKMBINDEX, TROPONINI in the last 168 hours. BNP (last 3 results)  Recent Labs  07/07/14 2210 08/07/14 1344  BNP 125.5* 297.8*    ProBNP (last 3 results) No results for input(s): PROBNP in the last 8760 hours.  CBG:  Recent Labs Lab 08/15/14 1045 08/15/14 1704 08/15/14 2101 08/16/14 0628 08/16/14 1133  GLUCAP 158* 119* 104* 144* 170*    Recent Results (from the past 240 hour(s))  Culture, blood (routine x 2)     Status: None   Collection Time: 08/07/14  5:35 PM  Result Value Ref Range Status   Specimen Description BLOOD RIGHT ARM  Final   Special Requests BOTTLES DRAWN AEROBIC AND ANAEROBIC 5CC  Final   Culture   Final    NO GROWTH 5 DAYS Performed at [facility]    Report Status 08/13/2014 FINAL  Final  Culture, blood (routine x 2)     Status: None    Collection Time: 08/07/14  5:46 PM  Result Value Ref Range Status   Specimen Description BLOOD RIGHT WRIST  Final   Special Requests BOTTLES DRAWN AEROBIC AND ANAEROBIC 5CC  Final   Culture   Final    NO GROWTH 5 DAYS Performed at [facility]    Report Status 08/13/2014 FINAL  Final  Body fluid culture     Status: None   Collection Time: 08/08/14 10:37 AM  Result Value Ref Range Status   Specimen Description FLUID PLEURAL LEFT  Final   Special Requests NONE  Final   Gram Stain   Final    NO WBC SEEN NO ORGANISMS SEEN Performed at [facility]    Culture   Final    NO GROWTH 3 DAYS Performed at [facility]    Report Status 08/11/2014 FINAL  Final     Studies: No results found.  Scheduled Meds: . aspirin EC  81 mg Oral Daily  . atorvastatin  20 mg Oral QHS  . clopidogrel  75 mg Oral Daily  . enoxaparin (LOVENOX) injection  30 mg Subcutaneous QHS  . famotidine  20 mg Oral QHS  . feeding supplement (ENSURE ENLIVE)  237 mL Oral TID BM  . ferrous sulfate  325 mg Oral Daily  . folic acid  1 mg Oral Daily  . gabapentin  100 mg Oral TID  . insulin aspart  0-9 Units Subcutaneous TID WC  . levofloxacin  750 mg Oral Q48H  . linagliptin  5 mg Oral Daily  . meclizine  25 mg Oral TID  . metoprolol tartrate  25 mg Oral BID  . midodrine  10 mg Oral TID WC  . polyethylene glycol  17 g Oral BID  . potassium chloride  40 mEq Oral Daily  . senna  2 tablet Oral QHS  . sertraline  50 mg Oral Daily  . sodium chloride  3 mL Intravenous Q12H   Continuous Infusions:    Principal Problem:   HCAP (healthcare-associated pneumonia) Active Problems:   MDD (major depressive disorder), recurrent episode, moderate   HYPERTENSION, BENIGN SYSTEMIC   S/P CABG x 3, April 16   Autonomic orthostatic hypotension   Protein-calorie malnutrition   Pleural effusion on left   Malnutrition of moderate degree   Pleural effusion   Pleural effusion, left   NSVT (nonsustained  ventricular tachycardia)    Time spent: 35 minutes    [facility] MD [facility] Pager [phone]. If 7PM-7AM, please contact night-coverage at [email or web address], password [facility] 08/16/2014, 2:58 PM  LOS: 9 days

## 2014-08-17 ENCOUNTER — Other Ambulatory Visit: Payer: Self-pay

## 2014-08-17 DIAGNOSIS — E46 Unspecified protein-calorie malnutrition: Secondary | ICD-10-CM

## 2014-08-17 DIAGNOSIS — F331 Major depressive disorder, recurrent, moderate: Secondary | ICD-10-CM

## 2014-08-17 DIAGNOSIS — J90 Pleural effusion, not elsewhere classified: Secondary | ICD-10-CM

## 2014-08-17 DIAGNOSIS — J948 Other specified pleural conditions: Secondary | ICD-10-CM

## 2014-08-17 DIAGNOSIS — E440 Moderate protein-calorie malnutrition: Secondary | ICD-10-CM

## 2014-08-17 LAB — GLUCOSE, CAPILLARY
GLUCOSE-CAPILLARY: 110 mg/dL — AB (ref 65–99)
GLUCOSE-CAPILLARY: 131 mg/dL — AB (ref 65–99)
Glucose-Capillary: 119 mg/dL — ABNORMAL HIGH (ref 65–99)
Glucose-Capillary: 84 mg/dL (ref 65–99)

## 2014-08-17 LAB — BASIC METABOLIC PANEL
Anion gap: 9 (ref 5–15)
BUN: 12 mg/dL (ref 6–20)
CALCIUM: 9.4 mg/dL (ref 8.9–10.3)
CO2: 20 mmol/L — AB (ref 22–32)
Chloride: 106 mmol/L (ref 101–111)
Creatinine, Ser: 1.05 mg/dL — ABNORMAL HIGH (ref 0.44–1.00)
GFR calc Af Amer: >60 mL/min (ref >60)
GFR, EST NON AFRICAN AMERICAN: 52 mL/min — AB (ref >60)
Glucose, Bld: 112 mg/dL — ABNORMAL HIGH (ref 65–99)
POTASSIUM: 4 mmol/L (ref 3.5–5.1)
SODIUM: 135 mmol/L (ref 135–145)

## 2014-08-17 NOTE — Patient Outreach (Signed)
Patient remains hospitalized in the acute care setting at [facility] Disposition pending patient's stabilizing medically.   Plan: Update with Hospital Liaison on Friday, August 18, 2014 regarding disposition.

## 2014-08-17 NOTE — Progress Notes (Signed)
[facility] PROGRESS NOTE  OHANNA GASSERT GUR:[id number] DOB: 1945/09/24 DOA: 08/07/2014 PCP: [name], MD    HPI/Subjective: Seen with significant other at bedside, has some headache and nausea but no vomiting. Patient has a folded towel on her forehead, reviewing the notes from yesterday patient was doing the same. Nurses tried to get her up but she felt dizzy and she fell back to the bed.  Assessment/Plan: #1 sepsis secondary to healthcare associated pneumonia and probable parapneumonic effusion Clinical improvement. Per light's criteria effusion exudative in nature.Chest x-ray with improvement. Blood cultures pending. Body fluid cultures negative. Discontinue Levaquin as patient has been adequately treated. Follow.  #2 nonsustained V. tach Patient noted to have 15 beat run of nonsustained V. tach during this hospitalization. Patient noted to have a 7 beat run of nonsustained V. tach overnight. Patient denied any chest pain. Magnesium at 2. Potassium is at 4.6. Coreg dose was increased to 6.25 mg twice a day. Coreg has been changed to lopressor per cardiology as patient on midodrine. TSH within normal limits at 3.353. 2-D echo with EF of 60-65% with no wall motion abnormalities and grade 1 diastolic dysfunction. Patient has been seen in consultation by cardiology and no further workup needed at this time. Follow.   #3 orthostatic hypotension/autonomic dysfunction secondary to diabetes Patient with significant dizziness with positional changes. Continue Midodrine. Continue to hold Lasix. Continue TED hose. Coreg has been changed to Lopressor per cardiology. Saline lock IV fluids. Lopressor dose has been decreased by cardiology. Will place patient on scheduled meclizine for the next one to 2 days.  Abdominal binder and TED hose in place, IV fluids given. Patient still has severe dizziness with standing.   #4 acute diastolic heart failure Patient was on Lasix 40 mg IV daily. Patient  with orthostasis and complained of dizziness and as such diuretics were discontinued. Creatinine trending back down. Saline lock IV fluids.   #5 type 2 diabetes Hemoglobin A1c was 8.2 on 06/2014. Patient also with a history of diabetic gastroparesis and neuropathy. Continue sliding scale insulin. Continue linagliptin.  #6 recent CABG 3 in [place] Stable. Continue aspirin, Plavix. Patient was unable to tolerate ACE inhibitor secondary to hypotension. Patient currently tolerating Coreg. Monitor for now.  #7 history of CVA Continue aspirin and Plavix for secondary stroke prevention.  #8 anemia H&H stable. Continue iron sulfate.  #9 right upper quadrant abdominal pain/cirrhosis/hep C HCV RNA genotype ordered pending. Will need to follow-up with the ID clinic as outpatient as well as GI.  #10 vitiligo  #11 moderate protein calorie malnutrition Stable.   #12 prophylaxis Pepcid for GI prophylaxis. Lovenox for DVT prophylaxis.    Code Status: Full Family Communication: updated patient and husband at bedside. Disposition Plan: Home when medically stable and no longer symptomatic from orthostatics.   Consultants:  Cardiology: Dr Irish Lack  08/10/14   Procedures:  CT angiogram chest 08/07/2014  CT head without contrast 08/07/2014  Chest x-ray 08/07/2014, 08/08/2014 08/09/2014,  2-D echo 08/08/2014   ultrasound-guided thoracentesis 08/08/2014 300 mL removed  Antibiotics:  IV vancomycin 08/07/2014 >>>> 08/09/2014    IV cefepime 08/07/2014>>>>> 08/09/2014  Oral Levaquin 08/11/2014>>>>> 08/16/2014   Objective: Filed Vitals:   08/17/14 1412  BP: 154/59  Pulse: 63  Temp: 98.1 F (36.7 C)  Resp: 20    Intake/Output Summary (Last 24 hours) at 08/17/14 1539 Last data filed at 08/17/14 1412  Gross per 24 hour  Intake    845 ml  Output    975  ml  Net   -130 ml   Filed Weights   08/15/14 0526 08/16/14 0622 08/17/14 0545  Weight: 56.291 kg (124 lb 1.6 oz) 55.52 kg  (122 lb 6.4 oz) 56.11 kg (123 lb 11.2 oz)    Exam:   General:  nad  Cardiovascular: RRR  Respiratory: CTAB  Abdomen: soft, nontender, nondistended, positive bowel sounds.  Musculoskeletal: no clubbing cyanosis or edema.  Data Reviewed: Basic Metabolic Panel:  Recent Labs Lab 08/12/14 0530 08/13/14 0334 08/14/14 0435 08/15/14 0237 08/16/14 0941 08/17/14 0344  NA 136 138 137 136 134* 135  K 3.8 4.9 4.3 4.5 3.9 4.0  CL 105 109 108 105 101 106  CO2 22 22 24 22 23  20*  GLUCOSE 111* 78 128* 99 147* 112*  BUN 25* 15 10 8 14 12   CREATININE 1.65* 1.12* 1.09* 1.02* 1.24* 1.05*  CALCIUM 9.1 9.1 9.2 9.6 9.3 9.4  MG 2.0  --  1.7 2.0  --   --    Liver Function Tests: No results for input(s): AST, ALT, ALKPHOS, BILITOT, PROT, ALBUMIN in the last 168 hours. No results for input(s): LIPASE, AMYLASE in the last 168 hours. No results for input(s): AMMONIA in the last 168 hours. CBC:  Recent Labs Lab 08/11/14 0433 08/12/14 0530 08/13/14 0334 08/14/14 0435 08/15/14 0237  WBC 10.0 6.9 6.7 5.0 7.3  HGB 13.4 11.6* 11.7* 11.5* 13.3  HCT 40.0 34.2* 34.7* 33.9* 38.6  MCV 88.5 87.7 87.4 87.4 86.2  PLT 299 222 221 217 263   Cardiac Enzymes: No results for input(s): CKTOTAL, CKMB, CKMBINDEX, TROPONINI in the last 168 hours. BNP (last 3 results)  Recent Labs  07/07/14 2210 08/07/14 1344  BNP 125.5* 297.8*    ProBNP (last 3 results) No results for input(s): PROBNP in the last 8760 hours.  CBG:  Recent Labs Lab 08/16/14 1133 08/16/14 1638 08/16/14 2100 08/17/14 0550 08/17/14 1053  GLUCAP 170* 102* 98 110* 119*    Recent Results (from the past 240 hour(s))  Culture, blood (routine x 2)     Status: None   Collection Time: 08/07/14  5:35 PM  Result Value Ref Range Status   Specimen Description BLOOD RIGHT ARM  Final   Special Requests BOTTLES DRAWN AEROBIC AND ANAEROBIC 5CC  Final   Culture   Final    NO GROWTH 5 DAYS Performed at [facility]    Report  Status 08/13/2014 FINAL  Final  Culture, blood (routine x 2)     Status: None   Collection Time: 08/07/14  5:46 PM  Result Value Ref Range Status   Specimen Description BLOOD RIGHT WRIST  Final   Special Requests BOTTLES DRAWN AEROBIC AND ANAEROBIC 5CC  Final   Culture   Final    NO GROWTH 5 DAYS Performed at [facility]    Report Status 08/13/2014 FINAL  Final  Body fluid culture     Status: None   Collection Time: 08/08/14 10:37 AM  Result Value Ref Range Status   Specimen Description FLUID PLEURAL LEFT  Final   Special Requests NONE  Final   Gram Stain   Final    NO WBC SEEN NO ORGANISMS SEEN Performed at [facility]    Culture   Final    NO GROWTH 3 DAYS Performed at [facility]    Report Status 08/11/2014 FINAL  Final     Studies: No results found.  Scheduled Meds: . aspirin EC  81 mg Oral Daily  .  atorvastatin  20 mg Oral QHS  . clopidogrel  75 mg Oral Daily  . enoxaparin (LOVENOX) injection  30 mg Subcutaneous QHS  . famotidine  20 mg Oral QHS  . feeding supplement (ENSURE ENLIVE)  237 mL Oral TID BM  . ferrous sulfate  325 mg Oral Daily  . folic acid  1 mg Oral Daily  . gabapentin  100 mg Oral TID  . insulin aspart  0-9 Units Subcutaneous TID WC  . linagliptin  5 mg Oral Daily  . meclizine  25 mg Oral TID  . metoprolol tartrate  25 mg Oral BID  . midodrine  10 mg Oral TID WC  . polyethylene glycol  17 g Oral BID  . potassium chloride  40 mEq Oral Daily  . senna  2 tablet Oral QHS  . sertraline  50 mg Oral Daily  . sodium chloride  3 mL Intravenous Q12H   Continuous Infusions:    Principal Problem:   HCAP (healthcare-associated pneumonia) Active Problems:   MDD (major depressive disorder), recurrent episode, moderate   HYPERTENSION, BENIGN SYSTEMIC   S/P CABG x 3, April 16   Autonomic orthostatic hypotension   Protein-calorie malnutrition   Pleural effusion on left   Malnutrition of moderate degree   Pleural effusion    Pleural effusion, left   NSVT (nonsustained ventricular tachycardia)    Time spent: 35 minutes    [facility] MD [facility] Pager [phone]. If 7PM-7AM, please contact night-coverage at [email or web address], password [facility] 08/17/2014, 3:39 PM  LOS: 10 days

## 2014-08-17 NOTE — Progress Notes (Addendum)
Pt TED hose removed to assess skin with assessment, Pt with ring around leg where TED hose was folded over. Pt states her TED hose have not been removed in a few days. Pt states she does not want her TED hose back on at this time. Pt states she wants to leave them off over night, will put them back on in the morning. Will continue to monitor. Ronnette Hila, RN

## 2014-08-18 ENCOUNTER — Ambulatory Visit: Payer: Medicare Other | Admitting: Family Medicine

## 2014-08-18 DIAGNOSIS — Z0289 Encounter for other administrative examinations: Secondary | ICD-10-CM

## 2014-08-18 LAB — GLUCOSE, CAPILLARY
GLUCOSE-CAPILLARY: 128 mg/dL — AB (ref 65–99)
Glucose-Capillary: 136 mg/dL — ABNORMAL HIGH (ref 65–99)
Glucose-Capillary: 136 mg/dL — ABNORMAL HIGH (ref 65–99)
Glucose-Capillary: 95 mg/dL (ref 65–99)

## 2014-08-18 LAB — BASIC METABOLIC PANEL
Anion gap: 11 (ref 5–15)
BUN: 14 mg/dL (ref 6–20)
CHLORIDE: 107 mmol/L (ref 101–111)
CO2: 19 mmol/L — AB (ref 22–32)
Calcium: 9.7 mg/dL (ref 8.9–10.3)
Creatinine, Ser: 1.18 mg/dL — ABNORMAL HIGH (ref 0.44–1.00)
GFR calc Af Amer: 52 mL/min — ABNORMAL LOW (ref >60)
GFR calc non Af Amer: 45 mL/min — ABNORMAL LOW (ref >60)
GLUCOSE: 120 mg/dL — AB (ref 65–99)
POTASSIUM: 4.2 mmol/L (ref 3.5–5.1)
Sodium: 137 mmol/L (ref 135–145)

## 2014-08-18 MED ORDER — IBUPROFEN 400 MG PO TABS
400.0000 mg | ORAL_TABLET | Freq: Four times a day (QID) | ORAL | Status: DC | PRN
  Filled 2014-08-18: qty 2

## 2014-08-18 NOTE — Clinical Social Work Note (Signed)
CSW reviewed chart. Pt does not have a skill need. Per PT note from 5/31 the pt was modified independent walking 400 feet. CSW informed the case manager and MD about this. The case manager reported that she will notify PT to work with the pt again.   [place], MSW, [place]

## 2014-08-18 NOTE — Progress Notes (Signed)
Pt BP elevated tonight 893-810 systolic. Pt states she has a headache, but headache is the same headache that she has had continually the past few days. Pt given tramadol for headache and scheduled BP medicine. BP re-check 168/84. MD on call paged via [place]. No new orders given. Ronnette Hila, RN

## 2014-08-18 NOTE — Progress Notes (Signed)
Pt states she constantly has a dull headache that has not gone away. States at times it is throbbing and nothing has helped. Pt c/o nausea this AM. Pt given PRN tylenol and phenergan. Will continue to monitor.

## 2014-08-18 NOTE — Progress Notes (Signed)
Pt had orthostatic changes with c/o dizziness. See flow sheet. Dr [place] Poli aware. Pt encouraged to sit up in chair but refuses to till after lunch

## 2014-08-18 NOTE — Progress Notes (Signed)
[facility] PROGRESS NOTE  Maria Fox ENI:[id number] DOB: 20-Jun-1945 DOA: 08/07/2014 PCP: [name], MD    HPI/Subjective: Seen with significant other at bedside. Story complaining about headache and nausea, folded towel on her forehead. Orthostatics checked by nursing staff, patient got dizzy BP went down from 136/67 to 121/32.  Assessment/Plan: #1 sepsis secondary to healthcare associated pneumonia and probable parapneumonic effusion Clinical improvement. Per light's criteria effusion exudative in nature.Chest x-ray with improvement. Blood cultures pending. Body fluid cultures negative. Discontinue Levaquin as patient has been adequately treated. Follow.  #2 nonsustained V. tach Patient noted to have 15 beat run of nonsustained V. tach during this hospitalization. Patient noted to have a 7 beat run of nonsustained V. tach overnight.  Magnesium at 2. Potassium is at 4.6.  Coreg dose was increased to 6.25 mg twice a day.  Coreg has been changed to lopressor per cardiology as patient on midodrine. TSH within normal limits at 3.353. 2-D echo with EF of 60-65% with no wall motion abnormalities and grade 1 diastolic dysfunction.  Patient has been seen in consultation by cardiology and no further workup needed at this time.  #3 orthostatic hypotension/autonomic dysfunction secondary to diabetes Patient with significant dizziness with positional changes. Continue Midodrine. Continue to hold Lasix. Continue TED hose. Coreg has been changed to Lopressor per cardiology. Saline lock IV fluids. Lopressor dose has been decreased by cardiology. Will place patient on scheduled meclizine for the next one to 2 days.  Abdominal binder and TED hose in place, IV fluids given. Patient still has severe dizziness with standing.   #4 acute diastolic heart failure Patient was on Lasix 40 mg IV daily. Patient with orthostasis and complained of dizziness and as such diuretics were discontinued.  Creatinine trending back down. Saline lock IV fluids.   #5 type 2 diabetes Hemoglobin A1c was 8.2 on 06/2014. Patient also with a history of diabetic gastroparesis and neuropathy. Continue sliding scale insulin. Continue linagliptin.  #6 recent CABG 3 in [place] Stable. Continue aspirin, Plavix. Patient was unable to tolerate ACE inhibitor secondary to hypotension. Patient currently tolerating Coreg. Monitor for now.  #7 history of CVA Continue aspirin and Plavix for secondary stroke prevention.  #8 anemia H&H stable. Continue iron sulfate.  #9 right upper quadrant abdominal pain/cirrhosis/hep C HCV RNA genotype ordered pending. Will need to follow-up with the ID clinic as outpatient as well as GI.  #10 vitiligo  #11 moderate protein calorie malnutrition Stable.   #12 prophylaxis Pepcid for GI prophylaxis. Lovenox for DVT prophylaxis.    Code Status: Full Family Communication: updated patient and husband at bedside. Disposition Plan: Home when medically stable and no longer symptomatic from orthostatics.   Consultants:  Cardiology: Dr Irish Lack  08/10/14   Procedures:  CT angiogram chest 08/07/2014  CT head without contrast 08/07/2014  Chest x-ray 08/07/2014, 08/08/2014 08/09/2014,  2-D echo 08/08/2014   ultrasound-guided thoracentesis 08/08/2014 300 mL removed  Antibiotics:  IV vancomycin 08/07/2014 >>>> 08/09/2014    IV cefepime 08/07/2014>>>>> 08/09/2014  Oral Levaquin 08/11/2014>>>>> 08/16/2014   Objective: Filed Vitals:   08/18/14 1443  BP: 197/72  Pulse: 68  Temp: 97.4 F (36.3 C)  Resp:     Intake/Output Summary (Last 24 hours) at 08/18/14 1606 Last data filed at 08/18/14 1444  Gross per 24 hour  Intake    560 ml  Output    451 ml  Net    109 ml   Filed Weights   08/16/14 0622 08/17/14 0545 08/18/14 0531  Weight: 55.52 kg (122 lb 6.4 oz) 56.11 kg (123 lb 11.2 oz) 57.2 kg (126 lb 1.7 oz)    Exam:   General:  nad  Cardiovascular:  RRR  Respiratory: CTAB  Abdomen: soft, nontender, nondistended, positive bowel sounds.  Musculoskeletal: no clubbing cyanosis or edema.  Data Reviewed: Basic Metabolic Panel:  Recent Labs Lab 08/12/14 0530  08/14/14 0435 08/15/14 0237 08/16/14 0941 08/17/14 0344 08/18/14 0343  NA 136  < > 137 136 134* 135 137  K 3.8  < > 4.3 4.5 3.9 4.0 4.2  CL 105  < > 108 105 101 106 107  CO2 22  < > 24 22 23  20* 19*  GLUCOSE 111*  < > 128* 99 147* 112* 120*  BUN 25*  < > 10 8 14 12 14   CREATININE 1.65*  < > 1.09* 1.02* 1.24* 1.05* 1.18*  CALCIUM 9.1  < > 9.2 9.6 9.3 9.4 9.7  MG 2.0  --  1.7 2.0  --   --   --   < > = values in this interval not displayed. Liver Function Tests: No results for input(s): AST, ALT, ALKPHOS, BILITOT, PROT, ALBUMIN in the last 168 hours. No results for input(s): LIPASE, AMYLASE in the last 168 hours. No results for input(s): AMMONIA in the last 168 hours. CBC:  Recent Labs Lab 08/12/14 0530 08/13/14 0334 08/14/14 0435 08/15/14 0237  WBC 6.9 6.7 5.0 7.3  HGB 11.6* 11.7* 11.5* 13.3  HCT 34.2* 34.7* 33.9* 38.6  MCV 87.7 87.4 87.4 86.2  PLT 222 221 217 263   Cardiac Enzymes: No results for input(s): CKTOTAL, CKMB, CKMBINDEX, TROPONINI in the last 168 hours. BNP (last 3 results)  Recent Labs  07/07/14 2210 08/07/14 1344  BNP 125.5* 297.8*    ProBNP (last 3 results) No results for input(s): PROBNP in the last 8760 hours.  CBG:  Recent Labs Lab 08/17/14 1053 08/17/14 1618 08/17/14 2046 08/18/14 0617 08/18/14 1142  GLUCAP 119* 131* 84 136* 136*    No results found for this or any previous visit (from the past 240 hour(s)).   Studies: No results found.  Scheduled Meds: . aspirin EC  81 mg Oral Daily  . atorvastatin  20 mg Oral QHS  . clopidogrel  75 mg Oral Daily  . enoxaparin (LOVENOX) injection  30 mg Subcutaneous QHS  . famotidine  20 mg Oral QHS  . feeding supplement (ENSURE ENLIVE)  237 mL Oral TID BM  . ferrous sulfate   325 mg Oral Daily  . folic acid  1 mg Oral Daily  . gabapentin  100 mg Oral TID  . insulin aspart  0-9 Units Subcutaneous TID WC  . linagliptin  5 mg Oral Daily  . meclizine  25 mg Oral TID  . metoprolol tartrate  25 mg Oral BID  . midodrine  10 mg Oral TID WC  . polyethylene glycol  17 g Oral BID  . potassium chloride  40 mEq Oral Daily  . senna  2 tablet Oral QHS  . sertraline  50 mg Oral Daily  . sodium chloride  3 mL Intravenous Q12H   Continuous Infusions:    Principal Problem:   HCAP (healthcare-associated pneumonia) Active Problems:   MDD (major depressive disorder), recurrent episode, moderate   HYPERTENSION, BENIGN SYSTEMIC   S/P CABG x 3, April 16   Autonomic orthostatic hypotension   Protein-calorie malnutrition   Pleural effusion on left   Malnutrition of moderate degree   Pleural effusion  Pleural effusion, left   NSVT (nonsustained ventricular tachycardia)    Time spent: 35 minutes    [facility] A MD [facility] Pager [phone]. If 7PM-7AM, please contact night-coverage at [email or web address], password [facility] 08/18/2014, 4:06 PM  LOS: 11 days

## 2014-08-18 NOTE — Progress Notes (Signed)
Long discussion with patient and her boyfriend ( at the bedside) about DCP; patient states that she is dizzy and cannot stand or walk and cannot go home. Patient's boyfriend stated " it's a mind thing and she needs to go home and go to the beach to get better. She just don't want to do anything." Patient is agreeable to go to SNF and requested [facility] / private room. D[place] Soc Worker made aware. Mindi Slicker RN,BSN,MHA [phone]

## 2014-08-19 LAB — GLUCOSE, CAPILLARY
GLUCOSE-CAPILLARY: 141 mg/dL — AB (ref 65–99)
GLUCOSE-CAPILLARY: 101 mg/dL — AB (ref 65–99)
Glucose-Capillary: 111 mg/dL — ABNORMAL HIGH (ref 65–99)
Glucose-Capillary: 71 mg/dL (ref 65–99)

## 2014-08-19 MED ORDER — SODIUM CHLORIDE 0.9 % IV BOLUS (SEPSIS)
500.0000 mL | Freq: Once | INTRAVENOUS | Status: AC
  Administered 2014-08-19: 500 mL via INTRAVENOUS

## 2014-08-19 NOTE — Progress Notes (Signed)
Pt is highly allergic to mushrooms has anaphylaxis if eaten, allergy has been added, will continue to monitor thanks, Arvella Nigh RN

## 2014-08-19 NOTE — Progress Notes (Signed)
PT worked with patient before lunch arrived. Patient was positive for orthostatic bp's. She actually passed out while trying to stand for three minutes. She was not able to stand for that long period of time. After having this episode patient felt really weak, nauseous, and she had to have a BM. md Elmahi notified.

## 2014-08-19 NOTE — Evaluation (Signed)
Physical Therapy Evaluation Patient Details Name: Maria Fox MRN: [id number] DOB: 1945/11/26 Today's Date: 08/19/2014   History of Present Illness  Maria Fox is a 69 y.o. female with PMH of HTN, DM, CVA, CAD s/p CABG (05/2120) complicated with HCAP (treated 4/28-5/9) presented with worsening SOB, associated with mild productive cough, chills no objective fever. Pt with left pleural effusion s/p thoracentesis 5/30  Clinical Impression  Pt states she has not been moving much since last evaluation 5/31 but did walk down the hall with nursing yesterday. Pt reports dizziness with positional changes. Pt continues to be able to mobilize and perform transfers well but limited by orthostatic hypotension. Will follow acutely to maximize strength, function and gait as cardiopulmonary tolerance will allow . Pt with TED hose and abdominal binder on throughout with RN initiating fluid bolus after vitals obtained. Pt with syncopal event after standing, returned to bed and in bed with HOB elevated end of session. RN present and aware.     Follow Up Recommendations SNF;Supervision/Assistance - 24 hour    Equipment Recommendations  None recommended by PT    Recommendations for Other Services       Precautions / Restrictions Precautions Precautions: Fall      Mobility  Bed Mobility Overal bed mobility: Modified Independent Bed Mobility: Supine to Sit;Sit to Supine     Supine to sit: Modified independent (Device/Increase time) Sit to supine: Modified independent (Device/Increase time)      Transfers     Transfers: Sit to/from [facility] Sit to Stand: Min guard Stand pivot transfers: Min guard       General transfer comment: for stability secondary to history of orthostatic hypotension. pt performed x 2 and pivot to and from [facility] as well  Ambulation/Gait                Stairs            Wheelchair Mobility    Modified Rankin (Stroke Patients Only)        Balance     Sitting balance-Leahy Scale: Good       Standing balance-Leahy Scale: Good                               Pertinent Vitals/Pain Pain Assessment: No/denies pain  Supine 144/66 HR 73 Sitting 102/61 HR 86 Standing 105/63 HR 98 Unable to stand 3 min as pt became dizzy and with return to sitting EOB had a syncopal episode grossly 10 sec.  Pt returned to lying and sat again with BP 84/62     Home Living Family/patient expects to be discharged to:: Private residence Living Arrangements: Spouse/significant other Available Help at Discharge: Family;Available 24 hours/day Type of Home: House Home Access: Stairs to enter   [employer] of Steps: 2 Home Layout: One level Home Equipment: Walker - 2 wheels;Cane - single point      Prior Function     Gait / Transfers Assistance Needed: walks without assist  ADL's / Homemaking Assistance Needed: spouse helps as necessary but limited assist for ADLs now        Hand Dominance        Extremity/Trunk Assessment   Upper Extremity Assessment: Overall WFL for tasks assessed           Lower Extremity Assessment: Generalized weakness      Cervical / Trunk Assessment: Normal  Communication   Communication: No difficulties  Cognition  Arousal/Alertness: Awake/alert Behavior During Therapy: WFL for tasks assessed/performed Overall Cognitive Status: Within Functional Limits for tasks assessed                      General Comments      Exercises        Assessment/Plan    PT Assessment Patient needs continued PT services  PT Diagnosis Difficulty walking;Generalized weakness   PT Problem List Decreased strength;Decreased activity tolerance;Cardiopulmonary status limiting activity;Decreased mobility  PT Treatment Interventions Gait training;Functional mobility training;Therapeutic activities;Therapeutic exercise;Patient/family education   PT Goals (Current goals can be  found in the Care Plan section) Acute Rehab PT Goals Patient Stated Goal: be able to take care of myself PT Goal Formulation: With patient/family Time For Goal Achievement: 09/02/14 Potential to Achieve Goals: Fair    Frequency Min 3X/week   Barriers to discharge        Co-evaluation               End of Session Equipment Utilized During Treatment: Gait belt Activity Tolerance: Treatment limited secondary to medical complications (Comment) Patient left: in bed;with call bell/phone within reach;with family/visitor present;with nursing/sitter in room Nurse Communication: Mobility status;Precautions         Time: 1129-1200 PT Time Calculation (min) (ACUTE ONLY): 31 min   Charges:   PT Evaluation $Initial PT Evaluation Tier I: 1 Procedure     PT G CodesMelford Aase 08/19/2014, 12:16 PM Elwyn Reach, [place]

## 2014-08-19 NOTE — Progress Notes (Signed)
Talked to patient again about DCP; patient is still agreeable to go to SNF short term; physical therapy is to work with patient todayAneta Mins [phone]

## 2014-08-19 NOTE — Clinical Social Work Note (Signed)
CSW contacted attending physician to discuss if patient will be ready to discharge over the weekend or next week.  Physician stated she is not medically ready and he was consulting cardiology due to patient passing out during PT session today.  Patient stated she would like to go to [place], [place] contacted [facility] who said they can take her and to call on Monday morning to find out if she is medically ready for discharge.  CSW to continue to follow patient, FL2 on chart to be signed by physician.  Jones Broom. [place], MSW, [place] 08/19/2014 4:41 PM

## 2014-08-19 NOTE — Progress Notes (Signed)
[facility] PROGRESS NOTE  Maria Fox VZC:[id number] DOB: May 09, 1945 DOA: 08/07/2014 PCP: [name], MD    HPI/Subjective: Seen with significant other at bedside. Less nausea today and less headache but as it cannot get up, PT tried to get her up and patient got dizzy and called last in the back. Patient blood pressure dropped significantly with standing, I'll ask cardiology and they can help.  Assessment/Plan: #1 sepsis secondary to healthcare associated pneumonia and probable parapneumonic effusion Clinical improvement. Per light's criteria effusion exudative in nature.Chest x-ray with improvement. Blood cultures pending. Body fluid cultures negative. Discontinue Levaquin as patient has been adequately treated. Follow.  #2 nonsustained V. tach Patient noted to have 15 beat run of nonsustained V. tach during this hospitalization. Patient noted to have a 7 beat run of nonsustained V. tach overnight.  Magnesium at 2. Potassium is at 4.6.  Coreg dose was increased to 6.25 mg twice a day.  Coreg has been changed to lopressor per cardiology as patient on midodrine. TSH within normal limits at 3.353. 2-D echo with EF of 60-65% with no wall motion abnormalities and grade 1 diastolic dysfunction.  Patient has been seen in consultation by cardiology and no further workup needed at this time.  #3 orthostatic hypotension/autonomic dysfunction secondary to diabetes Patient with significant dizziness with positional changes. Continue Midodrine. Continue to hold Lasix. Continue TED hose. Coreg has been changed to Lopressor per cardiology. Saline lock IV fluids. Lopressor dose has been decreased by cardiology. Will place patient on scheduled meclizine for the next one to 2 days.  Given IV fluids, TED hose, abdominal binder, midodrine. Previously developed profound hypokalemia on fludrocortisone. I well ask cardiology to evaluate the patient.   #4 acute diastolic heart failure Patient was  on Lasix 40 mg IV daily. Patient with orthostasis and complained of dizziness and as such diuretics were discontinued. Creatinine trending back down. Saline lock IV fluids.   #5 type 2 diabetes Hemoglobin A1c was 8.2 on 06/2014. Patient also with a history of diabetic gastroparesis and neuropathy. Continue sliding scale insulin. Continue linagliptin.  #6 recent CABG 3 in [place] Stable. Continue aspirin, Plavix. Patient was unable to tolerate ACE inhibitor secondary to hypotension. Patient currently tolerating Coreg. Monitor for now.  #7 history of CVA Continue aspirin and Plavix for secondary stroke prevention.  #8 anemia H&H stable. Continue iron sulfate.  #9 right upper quadrant abdominal pain/cirrhosis/hep C HCV RNA genotype ordered pending. Will need to follow-up with the ID clinic as outpatient as well as GI.  #10 vitiligo  #11 moderate protein calorie malnutrition Stable.   #12 prophylaxis Pepcid for GI prophylaxis. Lovenox for DVT prophylaxis.    Code Status: Full Family Communication: updated patient and husband at bedside. Disposition Plan: Home when medically stable and no longer symptomatic from orthostatics.   Consultants:  Cardiology: Dr Irish Lack  08/10/14   Procedures:  CT angiogram chest 08/07/2014  CT head without contrast 08/07/2014  Chest x-ray 08/07/2014, 08/08/2014 08/09/2014,  2-D echo 08/08/2014   ultrasound-guided thoracentesis 08/08/2014 300 mL removed  Antibiotics:  IV vancomycin 08/07/2014 >>>> 08/09/2014    IV cefepime 08/07/2014>>>>> 08/09/2014  Oral Levaquin 08/11/2014>>>>> 08/16/2014   Objective: Filed Vitals:   08/19/14 1211  BP: 84/62  Pulse: 73  Temp:   Resp:     Intake/Output Summary (Last 24 hours) at 08/19/14 1223 Last data filed at 08/19/14 0949  Gross per 24 hour  Intake    480 ml  Output      0  ml  Net    480 ml   Filed Weights   08/17/14 0545 08/18/14 0531 08/19/14 0600  Weight: 56.11 kg (123 lb 11.2  oz) 57.2 kg (126 lb 1.7 oz) 51.529 kg (113 lb 9.6 oz)    Exam:   General:  nad  Cardiovascular: RRR  Respiratory: CTAB  Abdomen: soft, nontender, nondistended, positive bowel sounds.  Musculoskeletal: no clubbing cyanosis or edema.  Data Reviewed: Basic Metabolic Panel:  Recent Labs Lab 08/14/14 0435 08/15/14 0237 08/16/14 0941 08/17/14 0344 08/18/14 0343  NA 137 136 134* 135 137  K 4.3 4.5 3.9 4.0 4.2  CL 108 105 101 106 107  CO2 24 22 23  20* 19*  GLUCOSE 128* 99 147* 112* 120*  BUN 10 8 14 12 14   CREATININE 1.09* 1.02* 1.24* 1.05* 1.18*  CALCIUM 9.2 9.6 9.3 9.4 9.7  MG 1.7 2.0  --   --   --    Liver Function Tests: No results for input(s): AST, ALT, ALKPHOS, BILITOT, PROT, ALBUMIN in the last 168 hours. No results for input(s): LIPASE, AMYLASE in the last 168 hours. No results for input(s): AMMONIA in the last 168 hours. CBC:  Recent Labs Lab 08/13/14 0334 08/14/14 0435 08/15/14 0237  WBC 6.7 5.0 7.3  HGB 11.7* 11.5* 13.3  HCT 34.7* 33.9* 38.6  MCV 87.4 87.4 86.2  PLT 221 217 263   Cardiac Enzymes: No results for input(s): CKTOTAL, CKMB, CKMBINDEX, TROPONINI in the last 168 hours. BNP (last 3 results)  Recent Labs  07/07/14 2210 08/07/14 1344  BNP 125.5* 297.8*    ProBNP (last 3 results) No results for input(s): PROBNP in the last 8760 hours.  CBG:  Recent Labs Lab 08/18/14 0617 08/18/14 1142 08/18/14 1643 08/18/14 2127 08/19/14 0654  GLUCAP 136* 136* 128* 95 111*    No results found for this or any previous visit (from the past 240 hour(s)).   Studies: No results found.  Scheduled Meds: . aspirin EC  81 mg Oral Daily  . atorvastatin  20 mg Oral QHS  . clopidogrel  75 mg Oral Daily  . enoxaparin (LOVENOX) injection  30 mg Subcutaneous QHS  . famotidine  20 mg Oral QHS  . feeding supplement (ENSURE ENLIVE)  237 mL Oral TID BM  . ferrous sulfate  325 mg Oral Daily  . folic acid  1 mg Oral Daily  . gabapentin  100 mg Oral TID   . insulin aspart  0-9 Units Subcutaneous TID WC  . linagliptin  5 mg Oral Daily  . meclizine  25 mg Oral TID  . metoprolol tartrate  25 mg Oral BID  . midodrine  10 mg Oral TID WC  . polyethylene glycol  17 g Oral BID  . potassium chloride  40 mEq Oral Daily  . senna  2 tablet Oral QHS  . sertraline  50 mg Oral Daily  . sodium chloride  3 mL Intravenous Q12H   Continuous Infusions:    Principal Problem:   HCAP (healthcare-associated pneumonia) Active Problems:   MDD (major depressive disorder), recurrent episode, moderate   HYPERTENSION, BENIGN SYSTEMIC   S/P CABG x 3, April 16   Autonomic orthostatic hypotension   Protein-calorie malnutrition   Pleural effusion on left   Malnutrition of moderate degree   Pleural effusion   Pleural effusion, left   NSVT (nonsustained ventricular tachycardia)    Time spent: 35 minutes    [facility] A MD [facility] Pager [phone]. If 7PM-7AM, please contact night-coverage  at [email or web address], password [facility] 08/19/2014, 12:23 PM  LOS: 12 days

## 2014-08-19 NOTE — Progress Notes (Signed)
Patient ID: Maria Fox, female   DOB: 07-25-1945, 69 y.o.   MRN: [id number]   EP Attending  Asked to comment on orthostasis in the setting of hypokalemia on florinef. She is now on midodrine. At this point, no good solutions to the problem of orthostasis. A lower dose of florinef 0.05 mg bid in conjunction with potassium replacement. Mostly though increasing the sodium intake and overall oral intake likely to be most helpful. Avoid all blood pressure lowering meds and allow the patient to remain hypertensive best option for treatment.  Mikle Bosworth.D

## 2014-08-19 NOTE — Progress Notes (Signed)
Informed by PT that pt 's BP low 80's/50's and not safe to be d/c to day as she think she will pass out.  Dr. Hartford Poli notified and MD return call.  To speak with PT, who is currently on the phone with MD.  >  Maria Haun,RN

## 2014-08-19 NOTE — Clinical Social Work Note (Signed)
Clinical Social Work Assessment  Patient Details  Name: Maria Fox MRN: [id number] Date of Birth: [birth year]  Date of referral:  08/19/14               Reason for consult:  Facility Placement                Permission sought to share information with:  Family Supports Permission granted to share information::  Yes, Verbal Permission Granted  Name::        Agency::  SNF admissions  Relationship::     Contact Information:     Housing/Transportation Living arrangements for the past 2 months:  Single Family Home Source of Information:  Patient Patient Interpreter Needed:  None Criminal Activity/Legal Involvement Pertinent to Current Situation/Hospitalization:  No - Comment as needed Significant Relationships:  Spouse Lives with:  Spouse Do you feel safe going back to the place where you live?  Yes Need for family participation in patient care:  No (Coment)  Care giving concerns: Patient states she does not feel strong enough to return home yet, and would like to go to SNF for short term rehab, since she only lives with her husband and her kids are grown.   Social Worker assessment / plan:  Patient is a 69 year old female who lives with her husband.  Patient is alert and oriented x4 and talkative.  Patient states she is not feeling well, and she passed out during her PT session.  Patient stated she feels very weak and would like to go to a SNF for short term rehab.  Patient states she is nervous about returning home because it is just her husband with her and she does not want anything to happen to him.  Patient stated she would like to go to [place] and would prefer a private room if one is available.  Patient is in agreement to going to SNF for short term rehab once she is medically ready and discharge orders have been received.  Employment status:  Retired Forensic scientist:  Medicare PT Recommendations:  [place] / Referral to community resources:      Patient/Family's Response to care:  Patient in agreement to going to SNF.  Patient/Family's Understanding of and Emotional Response to Diagnosis, Current Treatment, and Prognosis:  Patient does not feel strong enough to return home yet, and would like to have some short term rehab first.  Emotional Assessment Appearance:  Appears stated age Attitude/Demeanor/Rapport:    Affect (typically observed):  Stable, Calm Orientation:  Oriented to Self, Oriented to Place, Oriented to  Time, Oriented to Situation Alcohol / Substance use:  Never Used Psych involvement (Current and /or in the community):  No (Comment)  Discharge Needs  Concerns to be addressed:  No discharge needs identified Readmission within the last 30 days:  No Current discharge risk:  None Barriers to Discharge:  No Barriers Identified   Ross Ludwig, LCSWA 08/19/2014, 4:44 PM

## 2014-08-19 NOTE — Clinical Social Work Placement (Signed)
   CLINICAL SOCIAL WORK PLACEMENT  NOTE  Date:  08/19/2014  Patient Details  Name: Maria Fox MRN: [id number] Date of Birth: 04/03/1945  Clinical Social Work is seeking post-discharge placement for this patient at the [place] level of care (*CSW will initial, date and re-position this form in  chart as items are completed):  Yes   Patient/family provided with [facility] Department's list of facilities offering this level of care within the geographic area requested by the patient (or if unable, by the patient's family).  Yes   Patient/family informed of their freedom to choose among providers that offer the needed level of care, that participate in Medicare, Medicaid or managed care program needed by the patient, have an available bed and are willing to accept the patient.  Yes   Patient/family informed of [facility]'s ownership interest in [facility] and [facility], as well as of the fact that they are under no obligation to receive care at these facilities.  PASRR submitted to EDS on 08/19/14     PASRR number received on 08/19/14     Existing PASRR number confirmed on       FL2 transmitted to all facilities in geographic area requested by pt/family on       FL2 transmitted to all facilities within larger geographic area on       Patient informed that his/her managed care company has contracts with or will negotiate with certain facilities, including the following:        Yes   Patient/family informed of bed offers received.  Patient chooses bed at [place] recommends and patient chooses bed at      Patient to be transferred to   on  .  Patient to be transferred to facility by       Patient family notified on   of transfer.  Name of family member notified:        PHYSICIAN Please sign FL2     Additional Comment:    _______________________________________________ Ross Ludwig,  LCSWA 08/19/2014, 4:49 PM

## 2014-08-20 LAB — GLUCOSE, CAPILLARY
GLUCOSE-CAPILLARY: 119 mg/dL — AB (ref 65–99)
Glucose-Capillary: 202 mg/dL — ABNORMAL HIGH (ref 65–99)
Glucose-Capillary: 125 mg/dL — ABNORMAL HIGH (ref 65–99)

## 2014-08-20 MED ORDER — METOPROLOL TARTRATE 12.5 MG HALF TABLET
12.5000 mg | ORAL_TABLET | Freq: Two times a day (BID) | ORAL | Status: DC
  Administered 2014-08-20 – 2014-08-21 (×3): 12.5 mg via ORAL
  Filled 2014-08-20 (×5): qty 1

## 2014-08-20 MED ORDER — FLUDROCORTISONE ACETATE 0.1 MG PO TABS
0.1000 mg | ORAL_TABLET | Freq: Two times a day (BID) | ORAL | Status: DC
  Administered 2014-08-20 – 2014-08-22 (×5): 0.1 mg via ORAL
  Filled 2014-08-20 (×6): qty 1

## 2014-08-20 NOTE — Progress Notes (Signed)
[facility] PROGRESS NOTE  ZOILA DITULLIO DUK:[id number] DOB: 10-15-1945 DOA: 08/07/2014 PCP: [name], MD    HPI/Subjective: Very difficult situation, still symptomatic with orthostatic hypotension. I have asked cardiology to reevaluate her, recommended to restart Florinef. Florinef started, and we'll see how she does in a.m.  Assessment/Plan: #1 sepsis secondary to healthcare associated pneumonia and probable parapneumonic effusion Clinical improvement. Per light's criteria effusion exudative in nature.Chest x-ray with improvement. Blood cultures pending. Body fluid cultures negative.  Antibiotics discontinued as she is been adequately treated.  #3 orthostatic hypotension/autonomic dysfunction secondary to diabetes Patient with significant dizziness with positional changes. Continue Midodrine. Continue to hold Lasix. Continue TED hose. Coreg has been changed to Lopressor per cardiology. Saline lock IV fluids. Lopressor dose has been decreased by cardiology. Will place patient on scheduled meclizine for the next one to 2 days.  She passed out when she was stood up with PT yesterday, he consulted cardiology. Recommendation to restart Florinef along with midodrine and TED hose.  #2 nonsustained V. tach Patient noted to have 15 beat run of nonsustained V. tach during this hospitalization. Patient noted to have a 7 beat run of nonsustained V. tach overnight.  Magnesium at 2. Potassium is at 4.6.  Coreg dose was increased to 6.25 mg twice a day.  Coreg has been changed to lopressor per cardiology as patient on midodrine. TSH within normal limits at 3.353. 2-D echo with EF of 60-65% with no wall motion abnormalities and grade 1 diastolic dysfunction.  Patient has been seen in consultation by cardiology and no further workup needed at this time.   #4 acute diastolic heart failure Patient was on Lasix 40 mg IV daily. Patient with orthostasis and complained of dizziness and as such  diuretics were discontinued.  Currently not on any diuretics, decrease the dose of metoprolol to 12.5 mg twice a day.  #5 type 2 diabetes Hemoglobin A1c was 8.2 on 06/2014. Patient also with a history of diabetic gastroparesis and neuropathy. Continue sliding scale insulin. Continue linagliptin.  #6 recent CABG 3 in [place] Stable. Continue aspirin, Plavix. Patient was unable to tolerate ACE inhibitor secondary to hypotension. Patient currently tolerating Coreg. Monitor for now.  #7 history of CVA Continue aspirin and Plavix for secondary stroke prevention.  #8 anemia H&H stable. Continue iron sulfate.  #9 right upper quadrant abdominal pain/cirrhosis/hep C HCV RNA genotype ordered pending. Will need to follow-up with the ID clinic as outpatient as well as GI.  #10 vitiligo  #11 moderate protein calorie malnutrition Stable.   #12 prophylaxis Pepcid for GI prophylaxis. Lovenox for DVT prophylaxis.    Code Status: Full Family Communication: updated patient and husband at bedside. Disposition Plan: Home when medically stable and no longer symptomatic from orthostatics.   Consultants:  Cardiology: Dr Irish Lack  08/10/14   Procedures:  CT angiogram chest 08/07/2014  CT head without contrast 08/07/2014  Chest x-ray 08/07/2014, 08/08/2014 08/09/2014,  2-D echo 08/08/2014   ultrasound-guided thoracentesis 08/08/2014 300 mL removed  Antibiotics:  IV vancomycin 08/07/2014 >>>> 08/09/2014    IV cefepime 08/07/2014>>>>> 08/09/2014  Oral Levaquin 08/11/2014>>>>> 08/16/2014   Objective: Filed Vitals:   08/20/14 0618  BP: 121/65  Pulse: 73  Temp: 97.5 F (36.4 C)  Resp: 20    Intake/Output Summary (Last 24 hours) at 08/20/14 1215 Last data filed at 08/20/14 0600  Gross per 24 hour  Intake    840 ml  Output    300 ml  Net    540 ml  Filed Weights   08/18/14 0531 08/19/14 0600 08/20/14 0618  Weight: 57.2 kg (126 lb 1.7 oz) 51.529 kg (113 lb 9.6 oz) 55.157 kg  (121 lb 9.6 oz)    Exam:   General:  nad  Cardiovascular: RRR  Respiratory: CTAB  Abdomen: soft, nontender, nondistended, positive bowel sounds.  Musculoskeletal: no clubbing cyanosis or edema.  Data Reviewed: Basic Metabolic Panel:  Recent Labs Lab 08/14/14 0435 08/15/14 0237 08/16/14 0941 08/17/14 0344 08/18/14 0343  NA 137 136 134* 135 137  K 4.3 4.5 3.9 4.0 4.2  CL 108 105 101 106 107  CO2 24 22 23  20* 19*  GLUCOSE 128* 99 147* 112* 120*  BUN 10 8 14 12 14   CREATININE 1.09* 1.02* 1.24* 1.05* 1.18*  CALCIUM 9.2 9.6 9.3 9.4 9.7  MG 1.7 2.0  --   --   --    Liver Function Tests: No results for input(s): AST, ALT, ALKPHOS, BILITOT, PROT, ALBUMIN in the last 168 hours. No results for input(s): LIPASE, AMYLASE in the last 168 hours. No results for input(s): AMMONIA in the last 168 hours. CBC:  Recent Labs Lab 08/14/14 0435 08/15/14 0237  WBC 5.0 7.3  HGB 11.5* 13.3  HCT 33.9* 38.6  MCV 87.4 86.2  PLT 217 263   Cardiac Enzymes: No results for input(s): CKTOTAL, CKMB, CKMBINDEX, TROPONINI in the last 168 hours. BNP (last 3 results)  Recent Labs  07/07/14 2210 08/07/14 1344  BNP 125.5* 297.8*    ProBNP (last 3 results) No results for input(s): PROBNP in the last 8760 hours.  CBG:  Recent Labs Lab 08/19/14 1250 08/19/14 1623 08/19/14 2152 08/20/14 0602 08/20/14 1137  GLUCAP 141* 101* 71 202* 119*    No results found for this or any previous visit (from the past 240 hour(s)).   Studies: No results found.  Scheduled Meds: . aspirin EC  81 mg Oral Daily  . atorvastatin  20 mg Oral QHS  . clopidogrel  75 mg Oral Daily  . enoxaparin (LOVENOX) injection  30 mg Subcutaneous QHS  . famotidine  20 mg Oral QHS  . feeding supplement (ENSURE ENLIVE)  237 mL Oral TID BM  . ferrous sulfate  325 mg Oral Daily  . fludrocortisone  0.1 mg Oral BID  . folic acid  1 mg Oral Daily  . gabapentin  100 mg Oral TID  . insulin aspart  0-9 Units  Subcutaneous TID WC  . linagliptin  5 mg Oral Daily  . meclizine  25 mg Oral TID  . metoprolol tartrate  25 mg Oral BID  . midodrine  10 mg Oral TID WC  . polyethylene glycol  17 g Oral BID  . potassium chloride  40 mEq Oral Daily  . senna  2 tablet Oral QHS  . sertraline  50 mg Oral Daily  . sodium chloride  3 mL Intravenous Q12H   Continuous Infusions:    Principal Problem:   HCAP (healthcare-associated pneumonia) Active Problems:   MDD (major depressive disorder), recurrent episode, moderate   HYPERTENSION, BENIGN SYSTEMIC   S/P CABG x 3, April 16   Autonomic orthostatic hypotension   Protein-calorie malnutrition   Pleural effusion on left   Malnutrition of moderate degree   Pleural effusion   Pleural effusion, left   NSVT (nonsustained ventricular tachycardia)    Time spent: 35 minutes    [facility] A MD [facility] Pager [phone]. If 7PM-7AM, please contact night-coverage at [email or web address], password [facility] 08/20/2014, 12:15 PM  LOS:  13 days

## 2014-08-21 LAB — GLUCOSE, CAPILLARY
Glucose-Capillary: 133 mg/dL — ABNORMAL HIGH (ref 65–99)
Glucose-Capillary: 191 mg/dL — ABNORMAL HIGH (ref 65–99)
Glucose-Capillary: 111 mg/dL — ABNORMAL HIGH (ref 65–99)
Glucose-Capillary: 97 mg/dL (ref 65–99)
Glucose-Capillary: 113 mg/dL — ABNORMAL HIGH (ref 65–99)

## 2014-08-21 LAB — BASIC METABOLIC PANEL
ANION GAP: 10 (ref 5–15)
BUN: 12 mg/dL (ref 6–20)
CALCIUM: 9.3 mg/dL (ref 8.9–10.3)
CO2: 21 mmol/L — ABNORMAL LOW (ref 22–32)
CREATININE: 1.1 mg/dL — AB (ref 0.44–1.00)
Chloride: 105 mmol/L (ref 101–111)
GFR calc Af Amer: 57 mL/min — ABNORMAL LOW (ref >60)
GFR calc non Af Amer: 49 mL/min — ABNORMAL LOW (ref >60)
GLUCOSE: 203 mg/dL — AB (ref 65–99)
POTASSIUM: 3.7 mmol/L (ref 3.5–5.1)
Sodium: 136 mmol/L (ref 135–145)

## 2014-08-21 NOTE — Progress Notes (Signed)
[facility] PROGRESS NOTE  Maria FIORENTINO ZOX:[id number] DOB: 02/18/1946 DOA: 08/07/2014 PCP: [name], MD    HPI/Subjective: Felt a little bit better, still complaining about some headache. Seen with significant other bedside. Wants to see if she can stand up today. I'll obtain orthostatic blood pressure  Assessment/Plan: #1 sepsis secondary to healthcare associated pneumonia and probable parapneumonic effusion Clinical improvement. Per light's criteria effusion exudative in nature.Chest x-ray with improvement. Blood cultures pending. Body fluid cultures negative.  Antibiotics discontinued as she is been adequately treated.  #3 orthostatic hypotension/autonomic dysfunction secondary to diabetes Patient with significant dizziness with positional changes. Continue Midodrine. Continue to hold Lasix. Continue TED hose. Coreg has been changed to Lopressor per cardiology. Saline lock IV fluids. Lopressor dose has been decreased by cardiology. Will place patient on scheduled meclizine for the next one to 2 days.  She passed out when she was stood up with PT yesterday, he consulted cardiology. Recommendation to restart Florinef along with midodrine and TED hose. We'll check orthostatic blood pressure today  #2 nonsustained V. tach Patient noted to have 15 beat run of nonsustained V. tach during this hospitalization. Patient noted to have a 7 beat run of nonsustained V. tach overnight.  Magnesium at 2. Potassium is at 4.6.  Coreg dose was increased to 6.25 mg twice a day.  Coreg has been changed to lopressor per cardiology as patient on midodrine. TSH within normal limits at 3.353. 2-D echo with EF of 60-65% with no wall motion abnormalities and grade 1 diastolic dysfunction.  Patient has been seen in consultation by cardiology and no further workup recommended.   #4 acute diastolic heart failure Patient was on Lasix 40 mg IV daily. Patient with orthostasis and complained of  dizziness and as such diuretics were discontinued.  Currently not on any diuretics, decrease the dose of metoprolol to 12.5 mg twice a day.  #5 type 2 diabetes Hemoglobin A1c was 8.2 on 06/2014. Patient also with a history of diabetic gastroparesis and neuropathy. Continue sliding scale insulin. Continue linagliptin.  #6 recent CABG 3 in [place] Stable. Continue aspirin, Plavix. Patient was unable to tolerate ACE inhibitor secondary to hypotension. Patient currently tolerating Coreg. Monitor for now.  #7 history of CVA Continue aspirin and Plavix for secondary stroke prevention.  #8 anemia H&H stable. Continue iron sulfate.  #9 right upper quadrant abdominal pain/cirrhosis/hep C HCV RNA genotype ordered pending. Will need to follow-up with the ID clinic as outpatient as well as GI.  #10 vitiligo  #11 moderate protein calorie malnutrition Stable.   #12 prophylaxis Pepcid for GI prophylaxis. Lovenox for DVT prophylaxis.    Code Status: Full Family Communication: updated patient and husband at bedside. Disposition Plan: Home when medically stable and no longer symptomatic from orthostatics.   Consultants:  Cardiology: Dr Irish Lack  08/10/14   Procedures:  CT angiogram chest 08/07/2014  CT head without contrast 08/07/2014  Chest x-ray 08/07/2014, 08/08/2014 08/09/2014,  2-D echo 08/08/2014   ultrasound-guided thoracentesis 08/08/2014 300 mL removed  Antibiotics:  IV vancomycin 08/07/2014 >>>> 08/09/2014    IV cefepime 08/07/2014>>>>> 08/09/2014  Oral Levaquin 08/11/2014>>>>> 08/16/2014   Objective: Filed Vitals:   08/21/14 1126  BP: 126/79  Pulse: 73  Temp:   Resp:     Intake/Output Summary (Last 24 hours) at 08/21/14 1208 Last data filed at 08/21/14 0200  Gross per 24 hour  Intake    600 ml  Output    200 ml  Net    400 ml  Filed Weights   08/19/14 0600 08/20/14 0618 08/21/14 0449  Weight: 51.529 kg (113 lb 9.6 oz) 55.157 kg (121 lb 9.6 oz)  55.067 kg (121 lb 6.4 oz)    Exam:   General:  nad  Cardiovascular: RRR  Respiratory: CTAB  Abdomen: soft, nontender, nondistended, positive bowel sounds.  Musculoskeletal: no clubbing cyanosis or edema.  Data Reviewed: Basic Metabolic Panel:  Recent Labs Lab 08/15/14 0237 08/16/14 0941 08/17/14 0344 08/18/14 0343 08/21/14 0442  NA 136 134* 135 137 136  K 4.5 3.9 4.0 4.2 3.7  CL 105 101 106 107 105  CO2 22 23 20* 19* 21*  GLUCOSE 99 147* 112* 120* 203*  BUN 8 14 12 14 12   CREATININE 1.02* 1.24* 1.05* 1.18* 1.10*  CALCIUM 9.6 9.3 9.4 9.7 9.3  MG 2.0  --   --   --   --    Liver Function Tests: No results for input(s): AST, ALT, ALKPHOS, BILITOT, PROT, ALBUMIN in the last 168 hours. No results for input(s): LIPASE, AMYLASE in the last 168 hours. No results for input(s): AMMONIA in the last 168 hours. CBC:  Recent Labs Lab 08/15/14 0237  WBC 7.3  HGB 13.3  HCT 38.6  MCV 86.2  PLT 263   Cardiac Enzymes: No results for input(s): CKTOTAL, CKMB, CKMBINDEX, TROPONINI in the last 168 hours. BNP (last 3 results)  Recent Labs  07/07/14 2210 08/07/14 1344  BNP 125.5* 297.8*    ProBNP (last 3 results) No results for input(s): PROBNP in the last 8760 hours.  CBG:  Recent Labs Lab 08/20/14 1137 08/20/14 1639 08/21/14 0219 08/21/14 0602 08/21/14 1121  GLUCAP 119* 125* 133* 191* 111*    No results found for this or any previous visit (from the past 240 hour(s)).   Studies: No results found.  Scheduled Meds: . aspirin EC  81 mg Oral Daily  . atorvastatin  20 mg Oral QHS  . clopidogrel  75 mg Oral Daily  . enoxaparin (LOVENOX) injection  30 mg Subcutaneous QHS  . famotidine  20 mg Oral QHS  . feeding supplement (ENSURE ENLIVE)  237 mL Oral TID BM  . ferrous sulfate  325 mg Oral Daily  . fludrocortisone  0.1 mg Oral BID  . folic acid  1 mg Oral Daily  . gabapentin  100 mg Oral TID  . insulin aspart  0-9 Units Subcutaneous TID WC  . linagliptin   5 mg Oral Daily  . meclizine  25 mg Oral TID  . metoprolol tartrate  12.5 mg Oral BID  . midodrine  10 mg Oral TID WC  . polyethylene glycol  17 g Oral BID  . potassium chloride  40 mEq Oral Daily  . senna  2 tablet Oral QHS  . sertraline  50 mg Oral Daily  . sodium chloride  3 mL Intravenous Q12H   Continuous Infusions:    Principal Problem:   HCAP (healthcare-associated pneumonia) Active Problems:   MDD (major depressive disorder), recurrent episode, moderate   HYPERTENSION, BENIGN SYSTEMIC   S/P CABG x 3, April 16   Autonomic orthostatic hypotension   Protein-calorie malnutrition   Pleural effusion on left   Malnutrition of moderate degree   Pleural effusion   Pleural effusion, left   NSVT (nonsustained ventricular tachycardia)    Time spent: 35 minutes    [facility] A MD [facility] Pager [phone]. If 7PM-7AM, please contact night-coverage at [email or web address], password [facility] 08/21/2014, 12:08 PM  LOS: 14 days

## 2014-08-22 ENCOUNTER — Telehealth: Payer: Self-pay | Admitting: Family Medicine

## 2014-08-22 DIAGNOSIS — E440 Moderate protein-calorie malnutrition: Secondary | ICD-10-CM | POA: Diagnosis not present

## 2014-08-22 DIAGNOSIS — I10 Essential (primary) hypertension: Secondary | ICD-10-CM | POA: Diagnosis not present

## 2014-08-22 DIAGNOSIS — I472 Ventricular tachycardia: Secondary | ICD-10-CM | POA: Diagnosis not present

## 2014-08-22 DIAGNOSIS — I5031 Acute diastolic (congestive) heart failure: Secondary | ICD-10-CM | POA: Diagnosis not present

## 2014-08-22 DIAGNOSIS — J90 Pleural effusion, not elsewhere classified: Secondary | ICD-10-CM | POA: Diagnosis not present

## 2014-08-22 DIAGNOSIS — A419 Sepsis, unspecified organism: Secondary | ICD-10-CM | POA: Diagnosis not present

## 2014-08-22 DIAGNOSIS — E1141 Type 2 diabetes mellitus with diabetic mononeuropathy: Secondary | ICD-10-CM | POA: Diagnosis not present

## 2014-08-22 DIAGNOSIS — Z7901 Long term (current) use of anticoagulants: Secondary | ICD-10-CM | POA: Diagnosis not present

## 2014-08-22 DIAGNOSIS — M6281 Muscle weakness (generalized): Secondary | ICD-10-CM | POA: Diagnosis not present

## 2014-08-22 DIAGNOSIS — Z951 Presence of aortocoronary bypass graft: Secondary | ICD-10-CM | POA: Diagnosis not present

## 2014-08-22 DIAGNOSIS — D509 Iron deficiency anemia, unspecified: Secondary | ICD-10-CM | POA: Diagnosis not present

## 2014-08-22 DIAGNOSIS — J948 Other specified pleural conditions: Secondary | ICD-10-CM | POA: Diagnosis not present

## 2014-08-22 DIAGNOSIS — D649 Anemia, unspecified: Secondary | ICD-10-CM | POA: Diagnosis not present

## 2014-08-22 DIAGNOSIS — E119 Type 2 diabetes mellitus without complications: Secondary | ICD-10-CM | POA: Diagnosis not present

## 2014-08-22 DIAGNOSIS — J189 Pneumonia, unspecified organism: Secondary | ICD-10-CM | POA: Diagnosis not present

## 2014-08-22 DIAGNOSIS — E785 Hyperlipidemia, unspecified: Secondary | ICD-10-CM | POA: Diagnosis not present

## 2014-08-22 DIAGNOSIS — E46 Unspecified protein-calorie malnutrition: Secondary | ICD-10-CM | POA: Diagnosis not present

## 2014-08-22 DIAGNOSIS — Z8701 Personal history of pneumonia (recurrent): Secondary | ICD-10-CM | POA: Diagnosis not present

## 2014-08-22 DIAGNOSIS — I5033 Acute on chronic diastolic (congestive) heart failure: Secondary | ICD-10-CM | POA: Diagnosis not present

## 2014-08-22 DIAGNOSIS — F331 Major depressive disorder, recurrent, moderate: Secondary | ICD-10-CM | POA: Diagnosis not present

## 2014-08-22 DIAGNOSIS — Z7982 Long term (current) use of aspirin: Secondary | ICD-10-CM | POA: Diagnosis not present

## 2014-08-22 DIAGNOSIS — Z8673 Personal history of transient ischemic attack (TIA), and cerebral infarction without residual deficits: Secondary | ICD-10-CM | POA: Diagnosis not present

## 2014-08-22 DIAGNOSIS — Z794 Long term (current) use of insulin: Secondary | ICD-10-CM | POA: Diagnosis not present

## 2014-08-22 DIAGNOSIS — I951 Orthostatic hypotension: Secondary | ICD-10-CM | POA: Diagnosis not present

## 2014-08-22 DIAGNOSIS — B182 Chronic viral hepatitis C: Secondary | ICD-10-CM | POA: Diagnosis not present

## 2014-08-22 DIAGNOSIS — I638 Other cerebral infarction: Secondary | ICD-10-CM | POA: Diagnosis not present

## 2014-08-22 LAB — BASIC METABOLIC PANEL
Anion gap: 9 (ref 5–15)
BUN: 13 mg/dL (ref 6–20)
CALCIUM: 9.2 mg/dL (ref 8.9–10.3)
CO2: 22 mmol/L (ref 22–32)
Chloride: 108 mmol/L (ref 101–111)
Creatinine, Ser: 1.03 mg/dL — ABNORMAL HIGH (ref 0.44–1.00)
GFR calc Af Amer: >60 mL/min (ref >60)
GFR, EST NON AFRICAN AMERICAN: 53 mL/min — AB (ref >60)
GLUCOSE: 186 mg/dL — AB (ref 65–99)
Potassium: 3.6 mmol/L (ref 3.5–5.1)
Sodium: 139 mmol/L (ref 135–145)

## 2014-08-22 LAB — GLUCOSE, CAPILLARY
GLUCOSE-CAPILLARY: 156 mg/dL — AB (ref 65–99)
Glucose-Capillary: 164 mg/dL — ABNORMAL HIGH (ref 65–99)

## 2014-08-22 MED ORDER — METOPROLOL TARTRATE 25 MG PO TABS: 12.5000 mg | ORAL_TABLET | Freq: Two times a day (BID) | ORAL | Status: DC

## 2014-08-22 MED ORDER — POTASSIUM CHLORIDE CRYS ER 20 MEQ PO TBCR: 40.0000 meq | EXTENDED_RELEASE_TABLET | Freq: Every day | ORAL | Status: DC

## 2014-08-22 MED ORDER — FLUDROCORTISONE ACETATE 0.1 MG PO TABS: 0.1000 mg | ORAL_TABLET | Freq: Two times a day (BID) | ORAL | Status: DC

## 2014-08-22 NOTE — Progress Notes (Signed)
Nutrition Follow-up  DOCUMENTATION CODES:  Non-severe (moderate) malnutrition in context of chronic illness  INTERVENTION:  Ensure Enlive (each supplement provides 350kcal and 20 grams of protein), Boost Breeze, Snacks, MVI  NUTRITION DIAGNOSIS:  Malnutrition related to chronic illness as evidenced by mild depletion of body fat, mild depletion of muscle mass.  Ongoing  GOAL:  Patient will meet greater than or equal to 90% of their needs  Unmet  MONITOR:  PO intake, Labs, Weight trends, I & O's  REASON FOR ASSESSMENT:  Malnutrition Screening Tool    ASSESSMENT: Pt is a 69 y.o. female with PMH of HTN, DM, CVA, CAD s/p CABG (0/0867) complicated with HCAP (treated 4/28-5/9) presented with worsening SOB, associated with mild productive cough, chills no objective fever. Patient also reports DOE, PNDs and orthopnea.  Pt reports ongoing poor appetite and meal completion of 25% at most meals. She reports drinking Ensure Enlive twice daily and Boost Breeze on occasion. She is agreeable to receiving snacks in between meals. Pt's weight has dropped an additional 3 lbs in the past week.   Height:  Ht Readings from Last 1 Encounters:  08/07/14 5\' 3"  (1.6 m)    Weight:  Wt Readings from Last 1 Encounters:  08/22/14 121 lb 9.6 oz (55.157 kg)    Ideal Body Weight:  52 kg  Wt Readings from Last 10 Encounters:  08/22/14 121 lb 9.6 oz (55.157 kg)  07/21/14 131 lb 4 oz (59.535 kg)  07/17/14 134 lb 3.2 oz (60.873 kg)  03/28/14 141 lb (63.957 kg)  09/17/13 148 lb (67.132 kg)  06/02/12 139 lb 8.8 oz (63.3 kg)  05/01/12 144 lb 8 oz (65.545 kg)  04/18/12 142 lb (64.411 kg)  04/17/12 142 lb 12 oz (64.751 kg)  04/06/12 142 lb 8 oz (64.638 kg)    BMI:  Body mass index is 21.55 kg/(m^2).  Estimated Nutritional Needs:  Kcal:  1400 - 1600  Protein:  65 - 80g  Fluid:  > 1.5 L  Skin:  Reviewed, no issues  Diet Order:  Diet regular Room service appropriate?: Yes; Fluid  consistency:: Thin Diet - low sodium heart healthy  EDUCATION NEEDS:  Education needs addressed   Intake/Output Summary (Last 24 hours) at 08/22/14 1141 Last data filed at 08/22/14 0616  Gross per 24 hour  Intake    960 ml  Output    500 ml  Net    460 ml    Last BM:  6/11  Pryor Ochoa RD, LDN Inpatient Clinical Dietitian Pager: [phone] After Hours Pager: [phone]

## 2014-08-22 NOTE — Telephone Encounter (Signed)
plz call today or tomorrow for hosp f/u visit - admitted 5/26-6/13 for HCAP.  Schedule f/u visit in next 2 weeks.

## 2014-08-22 NOTE — Progress Notes (Signed)
Report given to [facility] , [facility]

## 2014-08-22 NOTE — Telephone Encounter (Signed)
Message left for patient to return my call.  

## 2014-08-22 NOTE — Discharge Summary (Signed)
Physician Discharge Summary  Maria Fox TZG:[id number] DOB: [birth year] DOA: 08/07/2014  PCP: [name], MD  Admit date: 08/07/2014 Discharge date: 08/22/2014  Time spent: 40 minutes  Recommendations for Outpatient Follow-up:  1. Follow-up with nursing home M.D. 2. If symptoms recurs can consider increase Florinef to 0.2 mg twice a day.  Discharge Diagnoses:  Principal Problem:   HCAP (healthcare-associated pneumonia) Active Problems:   MDD (major depressive disorder), recurrent episode, moderate   HYPERTENSION, BENIGN SYSTEMIC   S/P CABG x 3, April 16   Autonomic orthostatic hypotension   Protein-calorie malnutrition   Pleural effusion on left   Malnutrition of moderate degree   Pleural effusion   Pleural effusion, left   NSVT (nonsustained ventricular tachycardia)   Discharge Condition: Stable Diet recommendation: Heart healthy  Filed Weights   08/20/14 0618 08/21/14 0449 08/22/14 0616  Weight: 55.157 kg (121 lb 9.6 oz) 55.067 kg (121 lb 6.4 oz) 55.157 kg (121 lb 9.6 oz)    History of present illness:  Maria Fox is a 69 y.o. female with PMH of HTN, DM, CVA, CAD s/p CABG (09/5914) complicated with HCAP (treated 4/28-5/9) presented with worsening SOB, associated with mild productive cough, chills no objective fever. Patient also reports DOE, PNDs and orthopnea. Denies chest pains, no leg edema.  -ED: CTA: L sided pleural effusion, with consolidation. Started on IV atx.   Hospital Course:  #1 sepsis secondary to healthcare associated pneumonia and probable parapneumonic effusion Clinical improvement. Per light's criteria effusion exudative in nature.Chest x-ray with improvement. Blood cultures showed no growth.. Body fluid cultures negative.  Antibiotics discontinued as she is been adequately treated with the intended course of antibiotics.  #3 orthostatic hypotension/autonomic dysfunction secondary to diabetes Patient with significant dizziness with positional  changes.  Continue Midodrine. Lasix discontinued. Continue TED hose.  Coreg has been changed to Lopressor per cardiology.  She passed out when she was stood up with PT on 08/19/14, cardiology reconsulted. Recommendation to restart Florinef along with midodrine and TED hose. We'll check orthostatic blood pressure today  #2 nonsustained V. tach Patient noted to have 15 beat run of nonsustained V. tach during this hospitalization. Patient noted to have a 7 beat run of nonsustained V. tach overnight.  Magnesium at 2. Potassium is at 4.6.  Coreg dose was increased to 6.25 mg twice a day.  Coreg has been changed to lopressor per cardiology as patient on midodrine. TSH within normal limits at 3.353. 2-D echo with EF of 60-65% with no wall motion abnormalities and grade 1 diastolic dysfunction.  Patient has been seen in consultation by cardiology and no further workup recommended.  #4 acute diastolic heart failure Patient was on Lasix 40 mg IV daily. Patient with orthostasis and complained of dizziness and as such diuretics were discontinued.  Currently not on any diuretics, decrease the dose of metoprolol to 12.5 mg twice a day.  #5 type 2 diabetes Hemoglobin A1c was 8.2 on 06/2014. Patient also with a history of diabetic gastroparesis and neuropathy. Continue sliding scale insulin. Continue linagliptin.  #6 recent CABG 3 in [place] Stable. Continue aspirin, Plavix. Patient was unable to tolerate ACE inhibitor secondary to hypotension. Patient currently tolerating Coreg. Monitor for now.  #7 history of CVA Continue aspirin and Plavix for secondary stroke prevention.  #8 anemia H&H stable. Continue iron sulfate.  #9 right upper quadrant abdominal pain/cirrhosis/hep C HCV RNA genotype ordered pending. Will need to follow-up with the ID clinic as outpatient as well as GI.  #  10 vitiligo  #11 moderate protein calorie  malnutrition Stable.   Procedures:  None  Consultations:  Cardiology  Discharge Exam: Filed Vitals:   08/22/14 0616  BP: 133/63  Pulse: 81  Temp: 97.9 F (36.6 C)  Resp: 16   General: Alert and awake, oriented x3, not in any acute distress. HEENT: anicteric sclera, pupils reactive to light and accommodation, EOMI CVS: S1-S2 clear, no murmur rubs or gallops Chest: clear to auscultation bilaterally, no wheezing, rales or rhonchi Abdomen: soft nontender, nondistended, normal bowel sounds, no organomegaly Extremities: no cyanosis, clubbing or edema noted bilaterally Neuro: Cranial nerves II-XII intact, no focal neurological deficits  Discharge Instructions   Discharge Instructions    AMB Referral to [facility]    Complete by:  As directed   Reason for consult:  Frequent hospitalizations in the past few months/HCAP/s/p CABG  Diagnoses of:   COPD/ Pneumonia Diabetes    Expected date of contact:  1-3 days (reserved for hospital discharges)  Please assign to community nurse for transition of care calls and assess for home visits.  Patient has multiple hospitalizations. Post hospital follow up needed. Questions please call: Natividad Brood, RN BSN [facility] Liaison  [phone] business mobile phone     Diet - low sodium heart healthy    Complete by:  As directed      Increase activity slowly    Complete by:  As directed           Current Discharge Medication List    START taking these medications   Details  fludrocortisone (FLORINEF) 0.1 MG tablet Take 1 tablet (0.1 mg total) by mouth 2 (two) times daily.    metoprolol tartrate (LOPRESSOR) 25 MG tablet Take 0.5 tablets (12.5 mg total) by mouth 2 (two) times daily.    potassium chloride SA (K-DUR,KLOR-CON) 20 MEQ tablet Take 2 tablets (40 mEq total) by mouth daily.      CONTINUE these medications which have NOT CHANGED   Details  aspirin EC 81 MG tablet Take 81 mg by mouth daily.  Take for 30 days then increase to 325mg  daily    atorvastatin (LIPITOR) 20 MG tablet Take 20 mg by mouth at bedtime.    clopidogrel (PLAVIX) 75 MG tablet Take 75 mg by mouth daily.    famotidine (PEPCID) 20 MG tablet Take 20 mg by mouth at bedtime.    ferrous sulfate 325 (65 FE) MG tablet Take 325 mg by mouth daily.    folic acid (FOLVITE) 1 MG tablet Take 1 mg by mouth daily.    gabapentin (NEURONTIN) 100 MG capsule Take 1 capsule (100 mg total) by mouth 3 (three) times daily. Qty: 60 capsule, Refills: 0    hydrOXYzine (ATARAX/VISTARIL) 10 MG tablet Take 10 mg by mouth 3 (three) times daily as needed for itching.    linagliptin (TRADJENTA) 5 MG TABS tablet Take 5 mg by mouth daily.    meclizine (ANTIVERT) 25 MG tablet Take 25 mg by mouth 3 (three) times daily as needed for dizziness.    metFORMIN (GLUCOPHAGE) 500 MG tablet Take 1 tablet (500 mg total) by mouth 2 (two) times daily with a meal. Qty: 60 tablet, Refills: 0    metoCLOPramide (REGLAN) 10 MG tablet Take 1 tablet (10 mg total) by mouth 4 (four) times daily -  before meals and at bedtime. Qty: 90 tablet, Refills: 0    midodrine (PROAMATINE) 10 MG tablet Take 1 tablet (10 mg total) by mouth 3 (  three) times daily with meals. Qty: 90 tablet, Refills: 0    ondansetron (ZOFRAN-ODT) 4 MG disintegrating tablet Take 4 mg by mouth 3 (three) times daily.     sertraline (ZOLOFT) 50 MG tablet Take 50 mg by mouth daily.    EPINEPHrine 0.3 mg/0.3 mL IJ SOAJ injection Inject 0.3 mLs (0.3 mg total) into the muscle once. Qty: 1 Device, Refills: 1    feeding supplement, GLUCERNA SHAKE, (GLUCERNA SHAKE) LIQD Take 237 mLs by mouth 3 (three) times daily between meals. Qty: 90 Can, Refills: 0    hydrocortisone cream 1 % Apply 1 application topically 2 (two) times daily. Qty: 30 g, Refills: 0    Vitamin D, Ergocalciferol, (DRISDOL) 50000 UNITS CAPS capsule Take 50,000 Units by mouth every 7 (seven) days. No directions listed on how to  take medication    Just has #10 as amount      STOP taking these medications     traMADol (ULTRAM) 50 MG tablet      acetaminophen (TYLENOL) 325 MG tablet      senna (SENOKOT) 8.6 MG TABS tablet        Allergies  Allergen Reactions  . Mushroom Extract Complex Anaphylaxis  . Codeine Nausea And Vomiting  . Penicillins Hives and Swelling  . Sulfa Antibiotics Nausea And Vomiting   Follow-up Information    Follow up with [name], MD.   Specialty:  [facility]   Contact information:   [place] [address] [phone]        The results of significant diagnostics from this hospitalization (including imaging, microbiology, ancillary and laboratory) are listed below for reference.    Significant Diagnostic Studies: Dg Chest 1 View  08/08/2014   CLINICAL DATA:  Status post left-sided thoracentesis.  EXAM: CHEST  1 VIEW  COMPARISON:  08/07/2014  FINDINGS: There is significant decreased fluid on the left. There is residual lung base opacity, likely atelectasis.  No pneumothorax.  No other lung opacity.  No other change from prior study.  IMPRESSION: 1. Significant decreased left pleural fluid following left-sided thoracentesis. No pneumothorax.   Electronically Signed   By: Lajean Manes M.D.   On: 08/08/2014 12:14   Dg Chest 2 View  08/09/2014   CLINICAL DATA:  Shortness of breath, weakness, pneumonia.  EXAM: CHEST  2 VIEW  COMPARISON:  Chest x-ray of Aug 08, 2014  FINDINGS: The lungs are adequately inflated. A small left pleural effusion persists. No pneumothorax is evident. Infiltrate or atelectasis at the left lung base is improving. The heart and pulmonary vascularity are normal. There is 6 intact sternal wires. The trachea is midline. The bony thorax is unremarkable.  IMPRESSION: Improving appearance of the left lower lobe pneumonia and small left pleural effusion. Elsewhere the chest reveals no acute abnormality.   Electronically Signed   By:  David  Martinique M.D.   On: 08/09/2014 09:16   Dg Chest 2 View  08/07/2014   CLINICAL DATA:  Shortness of breath  EXAM: CHEST  2 VIEW  COMPARISON:  07/15/2014  FINDINGS: Previous median sternotomy and CABG procedure. There is a small to moderate left pleural effusion with left lower lobe atelectasis and airspace consolidation. The right lung appears clear. Visualized osseous structures are unremarkable.  IMPRESSION: 1. Left pleural effusion 2. Left lower lobe atelectasis and consolidation.   Electronically Signed   By: Kerby Moors M.D.   On: 08/07/2014 14:48   Ct Head Wo Contrast  08/07/2014  CLINICAL DATA:  Right-sided headache since yesterday  EXAM: CT HEAD WITHOUT CONTRAST  TECHNIQUE: Contiguous axial images were obtained from the base of the skull through the vertex without intravenous contrast.  COMPARISON:  07/14/2014  FINDINGS: Mild cortical volume loss noted with proportional ventricular prominence. Areas of periventricular white matter hypodensity are most compatible with small vessel ischemic change. No acute hemorrhage, infarct, or mass lesion is identified. No midline shift. Right maxillary sinus air-fluid level. Orbits are unremarkable. No skull fracture. No soft tissue abnormality.  IMPRESSION: Chronic findings as above without acute intracranial abnormality.  Right maxillary sinusitis.   Electronically Signed   By: Conchita Paris M.D.   On: 08/07/2014 15:11   Ct Angio Chest Pe W/cm &/or Wo Cm  08/07/2014   CLINICAL DATA:  Chest pain and shortness of Breath, recent open heart surgery  EXAM: CT ANGIOGRAPHY CHEST WITH CONTRAST  TECHNIQUE: Multidetector CT imaging of the chest was performed using the standard protocol during bolus administration of intravenous contrast. Multiplanar CT image reconstructions and MIPs were obtained to evaluate the vascular anatomy.  CONTRAST:  57mL OMNIPAQUE IOHEXOL 350 MG/ML SOLN  COMPARISON:  Plain film from earlier in the same day  FINDINGS: Large left-sided  pleural effusion is identified. Left lower lobe consolidation is noted associated with the effusion. The lungs are otherwise clear. The hilar and mediastinal structures are within normal limits. The thoracic inlet shows no acute abnormality. Coronary calcifications are seen. Changes of prior coronary bypass grafting are noted. The pulmonary artery is well visualized and demonstrates a normal branching pattern. No intraluminal filling defect to suggest pulmonary embolism is identified. Diffuse aortic calcifications are seen without aneurysmal dilatation or dissection.  The visualized upper abdomen reveals no acute abnormality. Bony structures are within normal limits.  Review of the MIP images confirms the above findings.  IMPRESSION: Large left-sided pleural effusion with associated consolidation.  No pulmonary emboli are identified   Electronically Signed   By: Inez Catalina M.D.   On: 08/07/2014 17:00   US Thoracentesis Asp Pleural Space W/img Guide  08/08/2014   INDICATION: Symptomatic L sided pleural effusion  EXAM: US THORACENTESIS ASP PLEURAL SPACE W/IMG GUIDE  COMPARISON:  None.  MEDICATIONS: 10 cc 1% lidocaine  COMPLICATIONS: None immediate  TECHNIQUE: Informed written consent was obtained from the patient after a discussion of the risks, benefits and alternatives to treatment. A timeout was performed prior to the initiation of the procedure.  Initial ultrasound scanning demonstrates a left pleural effusion. The lower chest was prepped and draped in the usual sterile fashion. 1% lidocaine was used for local anesthesia.  Under direct ultrasound guidance, a 19 gauge, 7-cm, Yueh catheter was introduced. An ultrasound image was saved for documentation purposes. The thoracentesis was performed. The catheter was removed and a dressing was applied. The patient tolerated the procedure well without immediate post procedural complication. The patient was escorted to have an upright chest radiograph.  FINDINGS: A  total of approximately 330 cc of serous fluid was removed. Requested samples were sent to the laboratory.  IMPRESSION: Successful ultrasound-guided L sided thoracentesis yielding 330 cc of pleural fluid.  Read by:  Lavonia Drafts [facility]   Electronically Signed   By: Jerilynn Mages.  Shick M.D.   On: 08/08/2014 11:40    Microbiology: No results found for this or any previous visit (from the past 240 hour(s)).   Labs: Basic Metabolic Panel:  Recent Labs Lab 08/16/14 0941 08/17/14 0344 08/18/14 0343 08/21/14 0442 08/22/14 0500  NA  134* 135 137 136 139  K 3.9 4.0 4.2 3.7 3.6  CL 101 106 107 105 108  CO2 23 20* 19* 21* 22  GLUCOSE 147* 112* 120* 203* 186*  BUN 14 12 14 12 13   CREATININE 1.24* 1.05* 1.18* 1.10* 1.03*  CALCIUM 9.3 9.4 9.7 9.3 9.2   Liver Function Tests: No results for input(s): AST, ALT, ALKPHOS, BILITOT, PROT, ALBUMIN in the last 168 hours. No results for input(s): LIPASE, AMYLASE in the last 168 hours. No results for input(s): AMMONIA in the last 168 hours. CBC: No results for input(s): WBC, NEUTROABS, HGB, HCT, MCV, PLT in the last 168 hours. Cardiac Enzymes: No results for input(s): CKTOTAL, CKMB, CKMBINDEX, TROPONINI in the last 168 hours. BNP: BNP (last 3 results)  Recent Labs  07/07/14 2210 08/07/14 1344  BNP 125.5* 297.8*    ProBNP (last 3 results) No results for input(s): PROBNP in the last 8760 hours.  CBG:  Recent Labs Lab 08/21/14 0602 08/21/14 1121 08/21/14 1641 08/21/14 2124 08/22/14 0613  GLUCAP 191* 111* 97 113* 156*       Signed:  Donnesha Karg A  [facility] 08/22/2014, 11:08 AM

## 2014-08-23 NOTE — Telephone Encounter (Signed)
Message left for patient to return my call and schedule follow up.

## 2014-08-24 ENCOUNTER — Encounter: Payer: Self-pay | Admitting: *Deleted

## 2014-08-24 NOTE — Telephone Encounter (Signed)
Message left for patient to return my call. Letter also mailed.

## 2014-08-25 ENCOUNTER — Encounter: Payer: Self-pay | Admitting: Internal Medicine

## 2014-08-25 ENCOUNTER — Non-Acute Institutional Stay (SKILLED_NURSING_FACILITY): Payer: Medicare Other | Admitting: Internal Medicine

## 2014-08-25 DIAGNOSIS — D509 Iron deficiency anemia, unspecified: Secondary | ICD-10-CM

## 2014-08-25 DIAGNOSIS — E46 Unspecified protein-calorie malnutrition: Secondary | ICD-10-CM

## 2014-08-25 DIAGNOSIS — I5033 Acute on chronic diastolic (congestive) heart failure: Secondary | ICD-10-CM | POA: Diagnosis not present

## 2014-08-25 DIAGNOSIS — A419 Sepsis, unspecified organism: Secondary | ICD-10-CM

## 2014-08-25 DIAGNOSIS — E1165 Type 2 diabetes mellitus with hyperglycemia: Secondary | ICD-10-CM

## 2014-08-25 DIAGNOSIS — I4729 Other ventricular tachycardia: Secondary | ICD-10-CM

## 2014-08-25 DIAGNOSIS — Z951 Presence of aortocoronary bypass graft: Secondary | ICD-10-CM

## 2014-08-25 DIAGNOSIS — I6389 Other cerebral infarction: Secondary | ICD-10-CM

## 2014-08-25 DIAGNOSIS — B182 Chronic viral hepatitis C: Secondary | ICD-10-CM | POA: Diagnosis not present

## 2014-08-25 DIAGNOSIS — IMO0002 Reserved for concepts with insufficient information to code with codable children: Secondary | ICD-10-CM

## 2014-08-25 DIAGNOSIS — I472 Ventricular tachycardia: Secondary | ICD-10-CM

## 2014-08-25 DIAGNOSIS — I638 Other cerebral infarction: Secondary | ICD-10-CM | POA: Diagnosis not present

## 2014-08-25 DIAGNOSIS — I951 Orthostatic hypotension: Secondary | ICD-10-CM

## 2014-08-25 DIAGNOSIS — E1141 Type 2 diabetes mellitus with diabetic mononeuropathy: Secondary | ICD-10-CM | POA: Diagnosis not present

## 2014-08-25 DIAGNOSIS — E1149 Type 2 diabetes mellitus with other diabetic neurological complication: Secondary | ICD-10-CM
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
## 2014-08-27 ENCOUNTER — Encounter: Payer: Self-pay | Admitting: Internal Medicine

## 2014-08-27 DIAGNOSIS — R768 Other specified abnormal immunological findings in serum: Secondary | ICD-10-CM | POA: Insufficient documentation

## 2014-08-27 DIAGNOSIS — A419 Sepsis, unspecified organism: Secondary | ICD-10-CM | POA: Insufficient documentation

## 2014-08-27 DIAGNOSIS — I5032 Chronic diastolic (congestive) heart failure: Secondary | ICD-10-CM | POA: Insufficient documentation

## 2014-08-27 DIAGNOSIS — E611 Iron deficiency: Secondary | ICD-10-CM | POA: Insufficient documentation

## 2014-08-27 DIAGNOSIS — I5030 Unspecified diastolic (congestive) heart failure: Secondary | ICD-10-CM

## 2014-08-27 HISTORY — DX: Chronic diastolic (congestive) heart failure: I50.32

## 2014-08-27 HISTORY — DX: Unspecified diastolic (congestive) heart failure: I50.30

## 2014-08-27 NOTE — Assessment & Plan Note (Signed)
secondary to healthcare associated pneumonia and probable parapneumonic effusion Clinical improvement. Per light's criteria effusion exudative in nature.Chest x-ray with improvement. Blood cultures showed no growth.. Body fluid cultures negative.  Antibiotics discontinued as she is been adequately treated with the intended course of antibiotics.

## 2014-08-27 NOTE — Assessment & Plan Note (Signed)
Contr aspirin and plavix for sec stroke prevention

## 2014-08-27 NOTE — Assessment & Plan Note (Signed)
/  autonomic dysfunction secondary to diabetes Patient with significant dizziness with positional changes.  Continue Midodrine. Lasix discontinued. Continue TED hose.  Coreg has been changed to Lopressor per cardiology.  She passed out when she was stood up with PT on 08/19/14, cardiology reconsulted. Recommendation to restart Florinef along with midodrine and TED hose. We'll check orthostatic blood pressure today

## 2014-08-27 NOTE — Assessment & Plan Note (Signed)
Patient noted to have 15 beat run of nonsustained V. tach during this hospitalization. Patient noted to have a 7 beat run of nonsustained V. tach overnight.  Magnesium at 2. Potassium is at 4.6.  Coreg dose was increased to 6.25 mg twice a day.  Coreg has been changed to lopressor per cardiology as patient on midodrine. TSH within normal limits at 3.353. 2-D echo with EF of 60-65% with no wall motion abnormalities and grade 1 diastolic dysfunction.  Patient has been seen in consultation by cardiology and no further workup recommended.

## 2014-08-27 NOTE — Assessment & Plan Note (Signed)
Continue FeSO4

## 2014-08-27 NOTE — Assessment & Plan Note (Signed)
Hemoglobin A1c was 8.2 on 06/2014. Patient also with a history of diabetic gastroparesis and neuropathy. Continue sliding scale insulin. Continue linagliptin.

## 2014-08-27 NOTE — Assessment & Plan Note (Signed)
Stable. Continue aspirin, Plavix. Patient was unable to tolerate ACE inhibitor secondary to hypotension. Patient currently tolerating Coreg. Monitor for now.

## 2014-08-27 NOTE — Assessment & Plan Note (Signed)
Patient was on Lasix 40 mg IV daily. Patient with orthostasis and complained of dizziness and as such diuretics were discontinued.  Currently not on any diuretics, decrease the dose of metoprolol to 12.5 mg twice a day.

## 2014-08-27 NOTE — Assessment & Plan Note (Signed)
Diet with supplements

## 2014-08-27 NOTE — Assessment & Plan Note (Signed)
With cirrhosis;HCV RNA genotype ordered pending. Will need to follow-up with the ID clinic as outpatient as well as GI.

## 2014-09-01 ENCOUNTER — Non-Acute Institutional Stay (SKILLED_NURSING_FACILITY): Payer: Medicare Other | Admitting: Internal Medicine

## 2014-09-01 ENCOUNTER — Encounter: Payer: Self-pay | Admitting: Internal Medicine

## 2014-09-01 DIAGNOSIS — I472 Ventricular tachycardia: Secondary | ICD-10-CM

## 2014-09-01 DIAGNOSIS — B182 Chronic viral hepatitis C: Secondary | ICD-10-CM | POA: Diagnosis not present

## 2014-09-01 DIAGNOSIS — I638 Other cerebral infarction: Secondary | ICD-10-CM

## 2014-09-01 DIAGNOSIS — Z951 Presence of aortocoronary bypass graft: Secondary | ICD-10-CM

## 2014-09-01 DIAGNOSIS — IMO0002 Reserved for concepts with insufficient information to code with codable children: Secondary | ICD-10-CM

## 2014-09-01 DIAGNOSIS — J90 Pleural effusion, not elsewhere classified: Secondary | ICD-10-CM

## 2014-09-01 DIAGNOSIS — I951 Orthostatic hypotension: Secondary | ICD-10-CM

## 2014-09-01 DIAGNOSIS — J189 Pneumonia, unspecified organism: Secondary | ICD-10-CM

## 2014-09-01 DIAGNOSIS — I6389 Other cerebral infarction: Secondary | ICD-10-CM

## 2014-09-01 DIAGNOSIS — E1141 Type 2 diabetes mellitus with diabetic mononeuropathy: Secondary | ICD-10-CM | POA: Diagnosis not present

## 2014-09-01 DIAGNOSIS — E46 Unspecified protein-calorie malnutrition: Secondary | ICD-10-CM | POA: Diagnosis not present

## 2014-09-01 DIAGNOSIS — A419 Sepsis, unspecified organism: Secondary | ICD-10-CM | POA: Diagnosis not present

## 2014-09-01 DIAGNOSIS — I5033 Acute on chronic diastolic (congestive) heart failure: Secondary | ICD-10-CM | POA: Diagnosis not present

## 2014-09-01 DIAGNOSIS — E1165 Type 2 diabetes mellitus with hyperglycemia: Secondary | ICD-10-CM

## 2014-09-01 DIAGNOSIS — D509 Iron deficiency anemia, unspecified: Secondary | ICD-10-CM | POA: Diagnosis not present

## 2014-09-01 DIAGNOSIS — E1149 Type 2 diabetes mellitus with other diabetic neurological complication: Secondary | ICD-10-CM

## 2014-09-01 DIAGNOSIS — I4729 Other ventricular tachycardia: Secondary | ICD-10-CM
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
## 2014-09-06 ENCOUNTER — Telehealth: Payer: Self-pay

## 2014-09-06 MED ORDER — ZOLPIDEM TARTRATE 5 MG PO TABS: 5.0000 mg | ORAL_TABLET | Freq: Every evening | ORAL | Status: DC | PRN

## 2014-09-06 NOTE — Telephone Encounter (Signed)
Pt left v/m; pt last seen 07/21/14 and has f/u [facility] appt on 09/15/14; for one week pt has insomnia;pt request med to help her sleep until appt on 09/15/14. [facility]. unable to reach pt by phone to get further info if pt can go to sleep and wake up later and cannot return to sleep or if initially pt cannot go to sleep.Please advise.

## 2014-09-06 NOTE — Telephone Encounter (Signed)
Message left for patient to return my call.  

## 2014-09-06 NOTE — Telephone Encounter (Signed)
Is pt still in rehab? If so will need to go through rehab doc. If pt at home, ok to try temporary course of low dose ambien as needed.

## 2014-09-07 NOTE — Telephone Encounter (Signed)
Patient notified and Rx called in as directed.

## 2014-09-07 NOTE — Telephone Encounter (Signed)
Message left for patient to return my call.  

## 2014-09-07 NOTE — Telephone Encounter (Signed)
Pt returned your call. Please call at [phone]

## 2014-09-09 ENCOUNTER — Emergency Department (HOSPITAL_COMMUNITY)
Admission: EM | Admit: 2014-09-09 | Discharge: 2014-09-09 | Disposition: A | Discharge status: Home / Self Care | Payer: Medicare Other | Attending: Emergency Medicine | Admitting: Emergency Medicine

## 2014-09-09 ENCOUNTER — Emergency Department (HOSPITAL_COMMUNITY): Payer: Medicare Other

## 2014-09-09 ENCOUNTER — Encounter (HOSPITAL_COMMUNITY): Payer: Self-pay | Admitting: Family Medicine

## 2014-09-09 DIAGNOSIS — Z79899 Other long term (current) drug therapy: Secondary | ICD-10-CM | POA: Insufficient documentation

## 2014-09-09 DIAGNOSIS — Z8701 Personal history of pneumonia (recurrent): Secondary | ICD-10-CM | POA: Insufficient documentation

## 2014-09-09 DIAGNOSIS — R630 Anorexia: Secondary | ICD-10-CM | POA: Diagnosis not present

## 2014-09-09 DIAGNOSIS — R531 Weakness: Secondary | ICD-10-CM | POA: Insufficient documentation

## 2014-09-09 DIAGNOSIS — R079 Chest pain, unspecified: Secondary | ICD-10-CM

## 2014-09-09 DIAGNOSIS — E785 Hyperlipidemia, unspecified: Secondary | ICD-10-CM | POA: Insufficient documentation

## 2014-09-09 DIAGNOSIS — G43909 Migraine, unspecified, not intractable, without status migrainosus: Secondary | ICD-10-CM | POA: Insufficient documentation

## 2014-09-09 DIAGNOSIS — R194 Change in bowel habit: Secondary | ICD-10-CM | POA: Diagnosis not present

## 2014-09-09 DIAGNOSIS — Z8673 Personal history of transient ischemic attack (TIA), and cerebral infarction without residual deficits: Secondary | ICD-10-CM | POA: Diagnosis not present

## 2014-09-09 DIAGNOSIS — Z880 Allergy status to penicillin: Secondary | ICD-10-CM | POA: Diagnosis not present

## 2014-09-09 DIAGNOSIS — Z7982 Long term (current) use of aspirin: Secondary | ICD-10-CM | POA: Diagnosis not present

## 2014-09-09 DIAGNOSIS — Z7952 Long term (current) use of systemic steroids: Secondary | ICD-10-CM | POA: Diagnosis not present

## 2014-09-09 DIAGNOSIS — Z8619 Personal history of other infectious and parasitic diseases: Secondary | ICD-10-CM | POA: Diagnosis not present

## 2014-09-09 DIAGNOSIS — R5383 Other fatigue: Secondary | ICD-10-CM

## 2014-09-09 DIAGNOSIS — I5030 Unspecified diastolic (congestive) heart failure: Secondary | ICD-10-CM | POA: Diagnosis not present

## 2014-09-09 DIAGNOSIS — I2510 Atherosclerotic heart disease of native coronary artery without angina pectoris: Secondary | ICD-10-CM | POA: Diagnosis not present

## 2014-09-09 DIAGNOSIS — Z872 Personal history of diseases of the skin and subcutaneous tissue: Secondary | ICD-10-CM | POA: Diagnosis not present

## 2014-09-09 DIAGNOSIS — R0781 Pleurodynia: Secondary | ICD-10-CM

## 2014-09-09 DIAGNOSIS — Z7902 Long term (current) use of antithrombotics/antiplatelets: Secondary | ICD-10-CM | POA: Insufficient documentation

## 2014-09-09 DIAGNOSIS — I10 Essential (primary) hypertension: Secondary | ICD-10-CM | POA: Insufficient documentation

## 2014-09-09 DIAGNOSIS — Z951 Presence of aortocoronary bypass graft: Secondary | ICD-10-CM | POA: Insufficient documentation

## 2014-09-09 DIAGNOSIS — Z87440 Personal history of urinary (tract) infections: Secondary | ICD-10-CM | POA: Insufficient documentation

## 2014-09-09 DIAGNOSIS — E119 Type 2 diabetes mellitus without complications: Secondary | ICD-10-CM | POA: Diagnosis not present

## 2014-09-09 DIAGNOSIS — Z9841 Cataract extraction status, right eye: Secondary | ICD-10-CM | POA: Diagnosis not present

## 2014-09-09 DIAGNOSIS — R109 Unspecified abdominal pain: Secondary | ICD-10-CM | POA: Diagnosis not present

## 2014-09-09 LAB — HEPATIC FUNCTION PANEL
ALBUMIN: 3.9 g/dL (ref 3.5–5.0)
ALT: 20 U/L (ref 14–54)
AST: 26 U/L (ref 15–41)
Alkaline Phosphatase: 61 U/L (ref 38–126)
BILIRUBIN TOTAL: 0.7 mg/dL (ref 0.3–1.2)
Bilirubin, Direct: <0.1 mg/dL — ABNORMAL LOW (ref 0.1–0.5)
Total Protein: 7.1 g/dL (ref 6.5–8.1)

## 2014-09-09 LAB — CBC
HCT: 36.5 % (ref 36.0–46.0)
Hemoglobin: 12.5 g/dL (ref 12.0–15.0)
MCH: 29.6 pg (ref 26.0–34.0)
MCHC: 34.2 g/dL (ref 30.0–36.0)
MCV: 86.3 fL (ref 78.0–100.0)
PLATELETS: 252 10*3/uL (ref 150–400)
RBC: 4.23 MIL/uL (ref 3.87–5.11)
RDW: 13.4 % (ref 11.5–15.5)
WBC: 6.3 10*3/uL (ref 4.0–10.5)

## 2014-09-09 LAB — BASIC METABOLIC PANEL
ANION GAP: 9 (ref 5–15)
BUN: 14 mg/dL (ref 6–20)
CALCIUM: 9.4 mg/dL (ref 8.9–10.3)
CHLORIDE: 107 mmol/L (ref 101–111)
CO2: 23 mmol/L (ref 22–32)
Creatinine, Ser: 1.06 mg/dL — ABNORMAL HIGH (ref 0.44–1.00)
GFR calc Af Amer: 60 mL/min — ABNORMAL LOW (ref >60)
GFR calc non Af Amer: 52 mL/min — ABNORMAL LOW (ref >60)
Glucose, Bld: 133 mg/dL — ABNORMAL HIGH (ref 65–99)
Potassium: 4.1 mmol/L (ref 3.5–5.1)
Sodium: 139 mmol/L (ref 135–145)

## 2014-09-09 LAB — I-STAT TROPONIN, ED: Troponin i, poc: 0.01 ng/mL (ref 0.00–0.08)

## 2014-09-09 LAB — BRAIN NATRIURETIC PEPTIDE: B NATRIURETIC PEPTIDE 5: 118.6 pg/mL — AB (ref 0.0–100.0)

## 2014-09-09 MED ORDER — DOCUSATE SODIUM 100 MG PO CAPS: 100.0000 mg | ORAL_CAPSULE | Freq: Two times a day (BID) | ORAL | Status: DC

## 2014-09-09 MED ORDER — ONDANSETRON HCL 4 MG PO TABS: 4.0000 mg | ORAL_TABLET | Freq: Four times a day (QID) | ORAL | Status: DC
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
## 2014-09-09 NOTE — ED Notes (Signed)
Lab aware of add on hepatic function

## 2014-09-09 NOTE — ED Notes (Signed)
Pt here for chest pain, weakness, sts she cant eat, sts no BM. St sshe has been feeling bad since yesterday. sts headache and neck pain.

## 2014-09-09 NOTE — Discharge Instructions (Signed)
Take the prescribed medication as directed.  Colace should help ease bowel movements. If tramadol not adequately controlling your pain, use 1-2 tabs of Vicodin at a time. Do not take excess Tylenol with Vicodin. Follow-up with your primary care physician next week as scheduled. Return to the ED for new or worsening symptoms.

## 2014-09-15 ENCOUNTER — Ambulatory Visit: Payer: Medicare Other | Admitting: Family Medicine

## 2014-09-15 ENCOUNTER — Encounter (HOSPITAL_COMMUNITY): Payer: Self-pay | Admitting: *Deleted

## 2014-09-15 ENCOUNTER — Emergency Department (HOSPITAL_COMMUNITY)
Admission: EM | Admit: 2014-09-15 | Discharge: 2014-09-16 | Disposition: A | Discharge status: Home / Self Care | Payer: Medicare Other | Attending: Emergency Medicine | Admitting: Emergency Medicine

## 2014-09-15 DIAGNOSIS — Z7901 Long term (current) use of anticoagulants: Secondary | ICD-10-CM | POA: Diagnosis not present

## 2014-09-15 DIAGNOSIS — I5030 Unspecified diastolic (congestive) heart failure: Secondary | ICD-10-CM | POA: Diagnosis not present

## 2014-09-15 DIAGNOSIS — Z87440 Personal history of urinary (tract) infections: Secondary | ICD-10-CM | POA: Diagnosis not present

## 2014-09-15 DIAGNOSIS — Z872 Personal history of diseases of the skin and subcutaneous tissue: Secondary | ICD-10-CM | POA: Insufficient documentation

## 2014-09-15 DIAGNOSIS — M791 Myalgia, unspecified site: Secondary | ICD-10-CM

## 2014-09-15 DIAGNOSIS — Z7982 Long term (current) use of aspirin: Secondary | ICD-10-CM | POA: Insufficient documentation

## 2014-09-15 DIAGNOSIS — Z951 Presence of aortocoronary bypass graft: Secondary | ICD-10-CM | POA: Diagnosis not present

## 2014-09-15 DIAGNOSIS — Z961 Presence of intraocular lens: Secondary | ICD-10-CM | POA: Diagnosis not present

## 2014-09-15 DIAGNOSIS — Z8701 Personal history of pneumonia (recurrent): Secondary | ICD-10-CM | POA: Insufficient documentation

## 2014-09-15 DIAGNOSIS — R110 Nausea: Secondary | ICD-10-CM | POA: Diagnosis present

## 2014-09-15 DIAGNOSIS — I10 Essential (primary) hypertension: Secondary | ICD-10-CM | POA: Insufficient documentation

## 2014-09-15 DIAGNOSIS — Z8619 Personal history of other infectious and parasitic diseases: Secondary | ICD-10-CM | POA: Insufficient documentation

## 2014-09-15 DIAGNOSIS — Z880 Allergy status to penicillin: Secondary | ICD-10-CM | POA: Diagnosis not present

## 2014-09-15 DIAGNOSIS — Z79899 Other long term (current) drug therapy: Secondary | ICD-10-CM | POA: Insufficient documentation

## 2014-09-15 DIAGNOSIS — E785 Hyperlipidemia, unspecified: Secondary | ICD-10-CM | POA: Insufficient documentation

## 2014-09-15 DIAGNOSIS — Z9889 Other specified postprocedural states: Secondary | ICD-10-CM | POA: Insufficient documentation

## 2014-09-15 DIAGNOSIS — I2510 Atherosclerotic heart disease of native coronary artery without angina pectoris: Secondary | ICD-10-CM | POA: Insufficient documentation

## 2014-09-15 DIAGNOSIS — E119 Type 2 diabetes mellitus without complications: Secondary | ICD-10-CM | POA: Diagnosis not present

## 2014-09-15 DIAGNOSIS — K5900 Constipation, unspecified: Secondary | ICD-10-CM | POA: Diagnosis not present

## 2014-09-15 DIAGNOSIS — Z8673 Personal history of transient ischemic attack (TIA), and cerebral infarction without residual deficits: Secondary | ICD-10-CM | POA: Insufficient documentation

## 2014-09-15 DIAGNOSIS — G43909 Migraine, unspecified, not intractable, without status migrainosus: Secondary | ICD-10-CM | POA: Insufficient documentation

## 2014-09-15 MED ORDER — SODIUM CHLORIDE 0.9 % IV BOLUS (SEPSIS)
500.0000 mL | Freq: Once | INTRAVENOUS | Status: AC
  Administered 2014-09-16: 500 mL via INTRAVENOUS

## 2014-09-15 NOTE — ED Notes (Signed)
Pt reports cramping in bilateral arms and legs since last night, depression, constipation (only has small amount of stool each time for several months), nausea, and lack of appetite.

## 2014-09-16 LAB — COMPREHENSIVE METABOLIC PANEL
ALT: 17 U/L (ref 14–54)
ANION GAP: 10 (ref 5–15)
AST: 22 U/L (ref 15–41)
Albumin: 4.1 g/dL (ref 3.5–5.0)
Alkaline Phosphatase: 63 U/L (ref 38–126)
BILIRUBIN TOTAL: 0.8 mg/dL (ref 0.3–1.2)
BUN: 23 mg/dL — ABNORMAL HIGH (ref 6–20)
CO2: 22 mmol/L (ref 22–32)
CREATININE: 0.95 mg/dL (ref 0.44–1.00)
Calcium: 9.4 mg/dL (ref 8.9–10.3)
Chloride: 104 mmol/L (ref 101–111)
GFR, EST NON AFRICAN AMERICAN: 59 mL/min — AB (ref >60)
Glucose, Bld: 93 mg/dL (ref 65–99)
Potassium: 3.8 mmol/L (ref 3.5–5.1)
Sodium: 136 mmol/L (ref 135–145)
Total Protein: 7.6 g/dL (ref 6.5–8.1)

## 2014-09-16 LAB — URINALYSIS, ROUTINE W REFLEX MICROSCOPIC
Glucose, UA: NEGATIVE mg/dL
Hgb urine dipstick: NEGATIVE
Ketones, ur: NEGATIVE mg/dL
Leukocytes, UA: NEGATIVE
NITRITE: NEGATIVE
PROTEIN: NEGATIVE mg/dL
SPECIFIC GRAVITY, URINE: 1.029 (ref 1.005–1.030)
UROBILINOGEN UA: 0.2 mg/dL (ref 0.0–1.0)
pH: 5 (ref 5.0–8.0)

## 2014-09-16 LAB — CBC WITH DIFFERENTIAL/PLATELET
Basophils Absolute: 0.1 10*3/uL (ref 0.0–0.1)
Basophils Relative: 1 % (ref 0–1)
Eosinophils Absolute: 0.3 10*3/uL (ref 0.0–0.7)
Eosinophils Relative: 4 % (ref 0–5)
HEMATOCRIT: 36.4 % (ref 36.0–46.0)
HEMOGLOBIN: 12.3 g/dL (ref 12.0–15.0)
LYMPHS PCT: 29 % (ref 12–46)
Lymphs Abs: 2.1 10*3/uL (ref 0.7–4.0)
MCH: 29.6 pg (ref 26.0–34.0)
MCHC: 33.8 g/dL (ref 30.0–36.0)
MCV: 87.5 fL (ref 78.0–100.0)
Monocytes Absolute: 0.6 10*3/uL (ref 0.1–1.0)
Monocytes Relative: 8 % (ref 3–12)
NEUTROS ABS: 4.3 10*3/uL (ref 1.7–7.7)
Neutrophils Relative %: 58 % (ref 43–77)
PLATELETS: 287 10*3/uL (ref 150–400)
RBC: 4.16 MIL/uL (ref 3.87–5.11)
RDW: 13.5 % (ref 11.5–15.5)
WBC: 7.4 10*3/uL (ref 4.0–10.5)

## 2014-09-16 LAB — MAGNESIUM: MAGNESIUM: 1.6 mg/dL — AB (ref 1.7–2.4)

## 2014-09-16 MED ORDER — MAGNESIUM SULFATE 2 GM/50ML IV SOLN
2.0000 g | Freq: Once | INTRAVENOUS | Status: AC
  Administered 2014-09-16: 2 g via INTRAVENOUS
  Filled 2014-09-16: qty 50

## 2014-09-16 MED ORDER — POLYETHYLENE GLYCOL 3350 17 G PO PACK
24.0000 g | PACK | Freq: Every day | ORAL | Status: DC
  Administered 2014-09-16: 24 g via ORAL
  Filled 2014-09-16: qty 2

## 2014-09-16 MED ORDER — POLYETHYLENE GLYCOL 3350 17 G PO PACK: PACK | ORAL | Status: DC

## 2014-09-16 NOTE — Discharge Instructions (Signed)
Follow-up with her primary care doctor as scheduled next week.  We talked today about increasing your fluid intake, adding in roughage to your diet, such as bran cereals, fruits and vegetables.  Begin taking a multivitamin.  Begin Mira lax.  Your magnesium was slightly low today, which may have caused some muscle cramping.  Otherwise, your workup was unremarkable.  Return to the emergency department for worsening condition or new concerning symptoms.   Constipation Constipation is when a person has fewer than three bowel movements a week, has difficulty having a bowel movement, or has stools that are dry, hard, or larger than normal. As people grow older, constipation is more common. If you try to fix constipation with medicines that make you have a bowel movement (laxatives), the problem may get worse. Long-term laxative use may cause the muscles of the colon to become weak. A low-fiber diet, not taking in enough fluids, and taking certain medicines may make constipation worse.  CAUSES   Certain medicines, such as antidepressants, pain medicine, iron supplements, antacids, and water pills.   Certain diseases, such as diabetes, irritable bowel syndrome (IBS), thyroid disease, or depression.   Not drinking enough water.   Not eating enough fiber-rich foods.   Stress or travel.   Lack of physical activity or exercise.   Ignoring the urge to have a bowel movement.   Using laxatives too much.  SIGNS AND SYMPTOMS   Having fewer than three bowel movements a week.   Straining to have a bowel movement.   Having stools that are hard, dry, or larger than normal.   Feeling full or bloated.   Pain in the lower abdomen.   Not feeling relief after having a bowel movement.  DIAGNOSIS  Your health care provider will take a medical history and perform a physical exam. Further testing may be done for severe constipation. Some tests may include:  A barium enema X-ray to examine your  rectum, colon, and, sometimes, your small intestine.   A sigmoidoscopy to examine your lower colon.   A colonoscopy to examine your entire colon. TREATMENT  Treatment will depend on the severity of your constipation and what is causing it. Some dietary treatments include drinking more fluids and eating more fiber-rich foods. Lifestyle treatments may include regular exercise. If these diet and lifestyle recommendations do not help, your health care provider may recommend taking over-the-counter laxative medicines to help you have bowel movements. Prescription medicines may be prescribed if over-the-counter medicines do not work.  HOME CARE INSTRUCTIONS   Eat foods that have a lot of fiber, such as fruits, vegetables, whole grains, and beans.  Limit foods high in fat and processed sugars, such as french fries, hamburgers, cookies, candies, and soda.   A fiber supplement may be added to your diet if you cannot get enough fiber from foods.   Drink enough fluids to keep your urine clear or pale yellow.   Exercise regularly or as directed by your health care provider.   Go to the restroom when you have the urge to go. Do not hold it.   Only take over-the-counter or prescription medicines as directed by your health care provider. Do not take other medicines for constipation without talking to your health care provider first.  [place] IF:   You have bright red blood in your stool.   Your constipation lasts for more than 4 days or gets worse.   You have abdominal or rectal pain.   You  have thin, pencil-like stools.   You have unexplained weight loss. MAKE SURE YOU:   Understand these instructions.  Will watch your condition.  Will get help right away if you are not doing well or get worse. Document Released: 11/24/2003 Document Revised: 03/02/2013 Document Reviewed: 12/07/2012 [facility] Patient Information 2015 [place], [place]. This information is not  intended to replace advice given to you by your health care provider. Make sure you discuss any questions you have with your health care provider.  Hypomagnesemia Magnesium is a common ion (mineral) in the body which is needed for metabolism. It is about how the body handles food and other chemical reactions necessary for life. Only about 2% of the magnesium in our body is found in the blood. When this is low, it is called hypomagnesemia. The blood will measure only a tiny amount of the magnesium in our body. When it is low in our blood, it does not mean that the whole body supply is low. The normal serum concentration ranges from 1.8-2.5 mEq/L. When the level gets to be less than 1.0 mEq/L, a number of problems begin to happen.  CAUSES   Receiving intravenous fluids without magnesium replacement.  Loss of magnesium from the bowel by nasogastric suction.  Loss of magnesium from nausea and vomiting or severe diarrhea. Any of the inflammatory bowel conditions can cause this.  Abuse of alcohol often leads to low serum magnesium.  An inherited form of magnesium loss happens when the kidneys lose magnesium. This is called familial or primary hypomagnesemia.  Some medications such as diuretics also cause the loss of magnesium. SYMPTOMS  These following problems are worse if the changes in magnesium levels come on suddenly.  Tremor.  Confusion.  Muscle weakness.  Oversensitive to sights and sounds.  Sensitive reflexes.  Depression.  Muscular fibrillations.  Overreactivity of the nerves.  Irritability.  Psychosis.  Spasms of the hand muscles.  Tetany (where the muscles go into uncontrollable spasms). DIAGNOSIS  This condition can be diagnosed by blood tests. TREATMENT   In an emergency, magnesium can be given intravenously (by vein).  If the condition is less worrisome, it can be corrected by diet. High levels of magnesium are found in green leafy vegetables, peas, beans, and  nuts among other things. It can also be given through medications by mouth.  If it is being caused by medications, changes can be made.  If alcohol is a problem, help is available if there are difficulties giving it up. Document Released: 11/21/2004 Document Revised: 07/12/2013 Document Reviewed: 10/16/2007 [facility] Patient Information 2015 [place], [place]. This information is not intended to replace advice given to you by your health care provider. Make sure you discuss any questions you have with your health care provider.  Muscle Pain Muscle pain (myalgia) may be caused by many things, including:  Overuse or muscle strain, especially if you are not in shape. This is the most common cause of muscle pain.  Injury.  Bruises.  Viruses, such as the flu.  Infectious diseases.  Fibromyalgia, which is a chronic condition that causes muscle tenderness, fatigue, and headache.  Autoimmune diseases, including lupus.  Certain drugs, including ACE inhibitors and statins. Muscle pain may be mild or severe. In most cases, the pain lasts only a short time and goes away without treatment. To diagnose the cause of your muscle pain, your health care provider will take your medical history. This means he or she will ask you when your muscle pain began and what  has been happening. If you have not had muscle pain for very long, your health care provider may want to wait before doing much testing. If your muscle pain has lasted a long time, your health care provider may want to run tests right away. If your health care provider thinks your muscle pain may be caused by illness, you may need to have additional tests to rule out certain conditions.  Treatment for muscle pain depends on the cause. Home care is often enough to relieve muscle pain. Your health care provider may also prescribe anti-inflammatory medicine. HOME CARE INSTRUCTIONS Watch your condition for any changes. The following actions may help to  lessen any discomfort you are feeling:  Only take over-the-counter or prescription medicines as directed by your health care provider.  Apply ice to the sore muscle:  Put ice in a plastic bag.  Place a towel between your skin and the bag.  Leave the ice on for 15-20 minutes, 3-4 times a day.  You may alternate applying hot and cold packs to the muscle as directed by your health care provider.  If overuse is causing your muscle pain, slow down your activities until the pain goes away.  Remember that it is normal to feel some muscle pain after starting a workout program. Muscles that have not been used often will be sore at first.  Do regular, gentle exercises if you are not usually active.  Warm up before exercising to lower your risk of muscle pain.  Do not continue working out if the pain is very bad. Bad pain could mean you have injured a muscle. SEEK MEDICAL CARE IF:  Your muscle pain gets worse, and medicines do not help.  You have muscle pain that lasts longer than 3 days.  You have a rash or fever along with muscle pain.  You have muscle pain after a tick bite.  You have muscle pain while working out, even though you are in good physical condition.  You have redness, soreness, or swelling along with muscle pain.  You have muscle pain after starting a new medicine or changing the dose of a medicine. SEEK IMMEDIATE MEDICAL CARE IF:  You have trouble breathing.  You have trouble swallowing.  You have muscle pain along with a stiff neck, fever, and vomiting.  You have severe muscle weakness or cannot move part of your body. MAKE SURE YOU:   Understand these instructions.  Will watch your condition.  Will get help right away if you are not doing well or get worse. Document Released: 01/17/2006 Document Revised: 03/02/2013 Document Reviewed: 12/22/2012 [facility] Patient Information 2015 [place], [place]. This information is not intended to replace advice given to  you by your health care provider. Make sure you discuss any questions you have with your health care provider.

## 2014-09-16 NOTE — ED Notes (Signed)
Bed: WA09 Expected date:  Expected time:  Means of arrival:  Comments: 

## 2014-09-16 NOTE — ED Notes (Signed)
Bed: WA07 Expected date:  Expected time:  Means of arrival:  Comments: 
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
## 2014-09-20 NOTE — Patient Outreach (Signed)
[facility]) Care Management  09/20/2014  ALELI NAVEDO 08/24/1945 [id number]   Unsuccessful attempt made to contact patient via telephone at [phone] and [phone].   Plan: Make another attempt to contact patient on Friday, July 15.

## 2014-09-23 ENCOUNTER — Encounter: Payer: Self-pay | Admitting: Family Medicine

## 2014-09-23 ENCOUNTER — Ambulatory Visit (INDEPENDENT_AMBULATORY_CARE_PROVIDER_SITE_OTHER): Payer: Medicare Other | Admitting: Family Medicine

## 2014-09-23 ENCOUNTER — Other Ambulatory Visit: Payer: Self-pay

## 2014-09-23 VITALS — BP 136/80 | HR 112 | Temp 97.4°F | Wt 121.5 lb

## 2014-09-23 DIAGNOSIS — E1141 Type 2 diabetes mellitus with diabetic mononeuropathy: Secondary | ICD-10-CM

## 2014-09-23 DIAGNOSIS — R768 Other specified abnormal immunological findings in serum: Secondary | ICD-10-CM

## 2014-09-23 DIAGNOSIS — R7689 Other specified abnormal immunological findings in serum: Secondary | ICD-10-CM

## 2014-09-23 DIAGNOSIS — E1165 Type 2 diabetes mellitus with hyperglycemia: Secondary | ICD-10-CM

## 2014-09-23 DIAGNOSIS — F331 Major depressive disorder, recurrent, moderate: Secondary | ICD-10-CM

## 2014-09-23 DIAGNOSIS — IMO0002 Reserved for concepts with insufficient information to code with codable children: Secondary | ICD-10-CM

## 2014-09-23 DIAGNOSIS — R894 Abnormal immunological findings in specimens from other organs, systems and tissues: Secondary | ICD-10-CM

## 2014-09-23 DIAGNOSIS — I639 Cerebral infarction, unspecified: Secondary | ICD-10-CM

## 2014-09-23 DIAGNOSIS — Z951 Presence of aortocoronary bypass graft: Secondary | ICD-10-CM

## 2014-09-23 DIAGNOSIS — L80 Vitiligo: Secondary | ICD-10-CM

## 2014-09-23 DIAGNOSIS — I951 Orthostatic hypotension: Secondary | ICD-10-CM | POA: Diagnosis not present

## 2014-09-23 DIAGNOSIS — E46 Unspecified protein-calorie malnutrition: Secondary | ICD-10-CM

## 2014-09-23 DIAGNOSIS — K760 Fatty (change of) liver, not elsewhere classified: Secondary | ICD-10-CM

## 2014-09-23 DIAGNOSIS — E1149 Type 2 diabetes mellitus with other diabetic neurological complication: Secondary | ICD-10-CM

## 2014-09-23 MED ORDER — VITAMIN D (ERGOCALCIFEROL) 1.25 MG (50000 UNIT) PO CAPS: 50000.0000 [IU] | ORAL_CAPSULE | ORAL | Status: DC

## 2014-09-23 MED ORDER — SERTRALINE HCL 50 MG PO TABS: 75.0000 mg | ORAL_TABLET | Freq: Every day | ORAL | Status: DC

## 2014-09-23 MED ORDER — CLOPIDOGREL BISULFATE 75 MG PO TABS: 75.0000 mg | ORAL_TABLET | Freq: Every day | ORAL | Status: DC

## 2014-09-23 NOTE — Progress Notes (Signed)
BP 136/80 mmHg  Pulse 112  Temp(Src) 97.4 F (36.3 C) (Oral)  Wt 121 lb 8 oz (55.112 kg)   CC: rehab f/u visit  Subjective:    Patient ID: Maria Fox, female    DOB: 02-Nov-1945, 69 y.o.   MRN: [id number]  HPI: Maria Fox is a 69 y.o. female presenting on 09/23/2014 for Follow-up   Recent hospitalization last month for HCAP with pleural effusion and marked orthostatic hypotension after recent 3v CABG 06/2014. Discharged to nursing home, sent home 09/01/2014 with [facility] involved. She actually declined home health. She feels like she's getting stronger each day.   Has not been taking plavix, metoprolol or florinef.   On midodrine and florinef 2/2 orthostatic hypotension/diabetic autonomic dysfunction. Actually hasn't been taking florinef - never refilled. Dizziness has improved. No paresthesias.  DM - complaint with tradjenta once daily and metformin 500mg  bid. Checks sugars fasting low 100s. No low sugars.   Next cardiologist appointment is Thursday.  Enlarging knot on neck.  + Hep C Ab test in hospital with undetectable viral load. Denies h/o Hep C infection.  Relevant past medical, surgical, family and social history reviewed and updated as indicated. Interim medical history since our last visit reviewed. Allergies and medications reviewed and updated. Current Outpatient Prescriptions on File Prior to Visit  Medication Sig  . aspirin EC 81 MG tablet Take 81 mg by mouth daily.   Marland Kitchen atorvastatin (LIPITOR) 20 MG tablet Take 20 mg by mouth at bedtime.  . docusate sodium (COLACE) 100 MG capsule Take 1 capsule (100 mg total) by mouth every 12 (twelve) hours.  . famotidine (PEPCID) 20 MG tablet Take 20 mg by mouth at bedtime.  . feeding supplement, GLUCERNA SHAKE, (GLUCERNA SHAKE) LIQD Take 237 mLs by mouth 3 (three) times daily between meals.  . ferrous sulfate 325 (65 FE) MG tablet Take 325 mg by mouth daily.  . folic acid (FOLVITE) 1 MG tablet Take 1 mg by mouth daily.  Marland Kitchen gabapentin  (NEURONTIN) 100 MG capsule Take 1 capsule (100 mg total) by mouth 3 (three) times daily.  . hydrocortisone cream 1 % Apply 1 application topically 2 (two) times daily.  . hydrOXYzine (ATARAX/VISTARIL) 10 MG tablet Take 10 mg by mouth 3 (three) times daily as needed for itching.  . linagliptin (TRADJENTA) 5 MG TABS tablet Take 5 mg by mouth daily.  . meclizine (ANTIVERT) 25 MG tablet Take 25 mg by mouth 3 (three) times daily as needed for dizziness.  . metFORMIN (GLUCOPHAGE) 500 MG tablet Take 1 tablet (500 mg total) by mouth 2 (two) times daily with a meal.  . metoCLOPramide (REGLAN) 10 MG tablet Take 1 tablet (10 mg total) by mouth 4 (four) times daily -  before meals and at bedtime.  . midodrine (PROAMATINE) 10 MG tablet Take 1 tablet (10 mg total) by mouth 3 (three) times daily with meals.  . traMADol (ULTRAM) 50 MG tablet Take 50 mg by mouth every 6 (six) hours as needed (for pain.).  Marland Kitchen metoprolol tartrate (LOPRESSOR) 25 MG tablet Take 0.5 tablets (12.5 mg total) by mouth 2 (two) times daily. (Patient not taking: Reported on 09/15/2014)  . ondansetron (ZOFRAN) 4 MG tablet Take 1 tablet (4 mg total) by mouth every 6 (six) hours. (Patient not taking: Reported on 09/15/2014)  . polyethylene glycol (MIRALAX / GLYCOLAX) packet Take one capful or 17 g in 8 ounces in fluid of your choice twice a day until stools are loose, then once  a day (Patient not taking: Reported on 09/23/2014)   No current facility-administered medications on file prior to visit.    Review of Systems Per HPI unless specifically indicated above     Objective:    BP 136/80 mmHg  Pulse 112  Temp(Src) 97.4 F (36.3 C) (Oral)  Wt 121 lb 8 oz (55.112 kg)  Wt Readings from Last 3 Encounters:  09/23/14 121 lb 8 oz (55.112 kg)  09/15/14 120 lb (54.432 kg)  09/09/14 120 lb (54.432 kg)    Physical Exam  Constitutional: She appears well-developed and well-nourished. No distress.  HENT:  Mouth/Throat: Oropharynx is clear and  moist. No oropharyngeal exudate.  Cardiovascular: Normal rate, regular rhythm, normal heart sounds and intact distal pulses.   No murmur heard. Pulmonary/Chest: Effort normal and breath sounds normal. No respiratory distress. She has no wheezes. She has no rales.  Musculoskeletal: She exhibits no edema.  R neck mass consistent with epidermal cyst  Skin: Rash noted.  Inflammatory vitiligo present  Psychiatric: She exhibits a depressed mood.  Nursing note and vitals reviewed.     Assessment & Plan:   Problem List Items Addressed This Visit    Autonomic orthostatic hypotension - Primary    Continues midodrine. Has not been taking florinef. Discussed importance of slow position changes. Orthostatic vital signs negative today. Will not restart florinef for now.      DM (diabetes mellitus), type 2, uncontrolled w/neurologic complication    Continue tradjenta and metformin. Check A1c next visit.      Fatty liver disease, nonalcoholic    By abd/pelvic CT      MDD (major depressive disorder), recurrent episode, moderate    Not doing well. Increase sertraline to 75mg  daily.      Relevant Medications   sertraline (ZOLOFT) 50 MG tablet   Positive hepatitis C antibody test    HCV quant undetectable. ?cleared infection.  Mention of cirrhosis during latest hospitalization - but I don't see evidence of this on any recent inaging, normal LFTs recently. I do see mention of fatty liver on abd/pelvis CT from 2011. Will continue to monitor.      Protein-calorie malnutrition    Albumin improved on last check. Uses glucerna PRN.      S/P CABG x 3    Advised she restart plavix - refilled today. Pt off metoprolol - has f/u with cards next week, asked to verify with them. Did not start today as HR 68.      Vitiligo (Chronic)    Discussed sunscreen use to vitiligo lesions when exposed to sun          Follow up plan: Return in about 3 months (around 12/24/2014), or as needed, for  medicare wellness.

## 2014-09-23 NOTE — Patient Outreach (Signed)
unsuccessful attempt made to contact patient for an initial telephone assessment for community care coordination. This RNCM has made several unsuccessful attempts and left messages for patient at contact numbers listed.   Plan: Discharge patient from caseload by sending [place]. Laurance Flatten an In  [employer] informing her of failed attempts to contact.

## 2014-09-23 NOTE — Progress Notes (Signed)
Pre visit review using our clinic review tool, if applicable. No additional management support is needed unless otherwise documented below in the visit note. 

## 2014-09-23 NOTE — Assessment & Plan Note (Signed)
HCV quant undetectable. ?cleared infection.  Mention of cirrhosis during latest hospitalization - but I don't see evidence of this on any recent inaging, normal LFTs recently. I do see mention of fatty liver on abd/pelvis CT from 2011. Will continue to monitor.

## 2014-09-23 NOTE — Assessment & Plan Note (Signed)
Continues midodrine. Has not been taking florinef. Discussed importance of slow position changes. Orthostatic vital signs negative today. Will not restart florinef for now.

## 2014-09-23 NOTE — Patient Instructions (Addendum)
Restart plavix sent to pharmacy Increase sertraline to 75mg  daily (1.5 tablets daily) Let's hold florinef (fludrocortisone) for now Check with cardiologist about whether you need to restart metoprolol. Good to see you today, call us with questions. Return as needed or in 3 months for medicare wellness visit

## 2014-09-23 NOTE — Assessment & Plan Note (Signed)
By abd/pelvic CT

## 2014-09-23 NOTE — Assessment & Plan Note (Signed)
Continue tradjenta and metformin. Check A1c next visit.

## 2014-09-23 NOTE — Assessment & Plan Note (Signed)
Advised she restart plavix - refilled today. Pt off metoprolol - has f/u with cards next week, asked to verify with them. Did not start today as HR 68.

## 2014-09-23 NOTE — Assessment & Plan Note (Signed)
Albumin improved on last check. Uses glucerna PRN.

## 2014-09-23 NOTE — Assessment & Plan Note (Signed)
Discussed sunscreen use to vitiligo lesions when exposed to sun

## 2014-09-23 NOTE — Assessment & Plan Note (Signed)
Not doing well. Increase sertraline to 75mg  daily.

## 2014-09-26 ENCOUNTER — Other Ambulatory Visit: Payer: Self-pay

## 2014-09-26 NOTE — Telephone Encounter (Signed)
Pt left v/m; pt seen on 09/23/14 about med refills, pt forgot to ask about 4 refills; pt request med refills for atorvastatin,ferrous sulfate,folic acid and tradjenta; do not see where Dr [name] has filled before; Dr in [place] had prescribed.Please advise.[facility] [place] [facility],

## 2014-09-27 MED ORDER — FOLIC ACID 1 MG PO TABS: 1.0000 mg | ORAL_TABLET | Freq: Every day | ORAL | Status: DC

## 2014-09-27 MED ORDER — LINAGLIPTIN 5 MG PO TABS: 5.0000 mg | ORAL_TABLET | Freq: Every day | ORAL | Status: DC

## 2014-09-27 MED ORDER — FERROUS SULFATE 325 (65 FE) MG PO TABS: 325.0000 mg | ORAL_TABLET | Freq: Every day | ORAL | Status: DC

## 2014-09-27 MED ORDER — ATORVASTATIN CALCIUM 20 MG PO TABS: 20.0000 mg | ORAL_TABLET | Freq: Every day | ORAL | Status: DC

## 2014-09-27 NOTE — Telephone Encounter (Signed)
Sent. Thanks.   

## 2014-09-28 NOTE — Patient Outreach (Signed)
[facility]) Care Management  09/28/2014  Maria Fox 05-29-1945 [id number]   Request from [name], RN to close case due to unable to contact patient for [place] [facility] services.  [name]. [place], [facility] [place] Assistant Phone: [phone] Fax: [phone]

## 2014-09-29 ENCOUNTER — Telehealth: Payer: Self-pay

## 2014-09-29 NOTE — Telephone Encounter (Signed)
Left a message for patient to return my call about having a Mammogram set up. Will await call back.  

## 2014-10-04 ENCOUNTER — Encounter: Payer: Self-pay | Admitting: Cardiology

## 2014-10-04 ENCOUNTER — Telehealth: Payer: Self-pay | Admitting: Cardiology

## 2014-10-04 ENCOUNTER — Ambulatory Visit (INDEPENDENT_AMBULATORY_CARE_PROVIDER_SITE_OTHER): Payer: Medicare Other | Admitting: Cardiology

## 2014-10-04 VITALS — BP 140/82 | HR 88 | Ht 63.0 in | Wt 120.1 lb

## 2014-10-04 DIAGNOSIS — I5032 Chronic diastolic (congestive) heart failure: Secondary | ICD-10-CM

## 2014-10-04 DIAGNOSIS — I639 Cerebral infarction, unspecified: Secondary | ICD-10-CM

## 2014-10-04 DIAGNOSIS — I2510 Atherosclerotic heart disease of native coronary artery without angina pectoris: Secondary | ICD-10-CM | POA: Diagnosis not present

## 2014-10-04 DIAGNOSIS — I472 Ventricular tachycardia: Secondary | ICD-10-CM

## 2014-10-04 DIAGNOSIS — I951 Orthostatic hypotension: Secondary | ICD-10-CM

## 2014-10-04 DIAGNOSIS — E785 Hyperlipidemia, unspecified: Secondary | ICD-10-CM

## 2014-10-04 DIAGNOSIS — I4729 Other ventricular tachycardia: Secondary | ICD-10-CM

## 2014-10-04 DIAGNOSIS — I10 Essential (primary) hypertension: Secondary | ICD-10-CM

## 2014-10-04 NOTE — Patient Instructions (Signed)
Medication Instructions:  Your physician recommends that you continue on your current medications as directed. Please refer to the Current Medication list given to you today.   Labwork: IN 4 MONTHS: FASTING LABS  Testing/Procedures: None  Follow-Up: Your physician wants you to follow-up in: 6 months with Dr. Radford Pax. You will receive a reminder letter in the mail two months in advance. If you don't receive a letter, please call our office to schedule the follow-up appointment.   Any Other Special Instructions Will Be Listed Below (If Applicable).

## 2014-10-04 NOTE — Telephone Encounter (Signed)
ROI faxed to [facility] records rec back gave to [place]/Dr.Turner

## 2014-10-05 ENCOUNTER — Encounter: Payer: Self-pay | Admitting: Cardiology

## 2014-10-05 ENCOUNTER — Other Ambulatory Visit: Payer: Self-pay
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
## 2014-10-05 NOTE — Telephone Encounter (Signed)
Pt left v/m requesting refill of famotidine to [facility]; Dr [name] has never filled, med has been filled by doctor pt used to see in [place]. Please advise. Pt last seen 09/23/14.

## 2014-10-06 MED ORDER — FAMOTIDINE 20 MG PO TABS: 20.0000 mg | ORAL_TABLET | Freq: Every day | ORAL | Status: DC

## 2014-10-06 NOTE — Telephone Encounter (Signed)
Sent. Thanks.   

## 2014-10-17 ENCOUNTER — Telehealth: Payer: Self-pay

## 2014-10-17 MED ORDER — METFORMIN HCL 500 MG PO TABS: 500.0000 mg | ORAL_TABLET | Freq: Two times a day (BID) | ORAL | Status: DC

## 2014-10-17 NOTE — Telephone Encounter (Signed)
Pt left v/m requesting refill metformin to [facility]; per protocol and 09/23/14 office note refilled med as requested. Left v/m requesting cb.

## 2014-10-19 NOTE — Telephone Encounter (Signed)
Unable to reach pt at any contact #. Spoke with [facility] and pt has picked up metformin.

## 2014-11-04 ENCOUNTER — Other Ambulatory Visit: Payer: Self-pay

## 2014-11-04 NOTE — Telephone Encounter (Signed)
Pt left message at front desk earlier to cb at [phone]. I called this number and left v/m for pt to cb.

## 2014-11-04 NOTE — Telephone Encounter (Signed)
Pt left v/m that she has faxed request for refills for meds and wants status of refill.left v/m requesting cb; no names of med given.

## 2014-11-07 MED ORDER — GABAPENTIN 100 MG PO CAPS: 100.0000 mg | ORAL_CAPSULE | Freq: Three times a day (TID) | ORAL | Status: DC

## 2014-11-07 MED ORDER — MECLIZINE HCL 25 MG PO TABS: 25.0000 mg | ORAL_TABLET | Freq: Three times a day (TID) | ORAL | Status: DC | PRN

## 2014-11-07 MED ORDER — TRAMADOL HCL 50 MG PO TABS: 50.0000 mg | ORAL_TABLET | Freq: Four times a day (QID) | ORAL | Status: DC | PRN

## 2014-11-07 MED ORDER — METOCLOPRAMIDE HCL 10 MG PO TABS: 10.0000 mg | ORAL_TABLET | Freq: Three times a day (TID) | ORAL | Status: DC

## 2014-11-07 MED ORDER — HYDROXYZINE HCL 10 MG PO TABS: 10.0000 mg | ORAL_TABLET | Freq: Three times a day (TID) | ORAL | Status: DC | PRN

## 2014-11-07 NOTE — Telephone Encounter (Signed)
Sent in. plz phone in tramadol

## 2014-11-07 NOTE — Telephone Encounter (Signed)
Pt request refill reglan (done per protocol), gabapentin, hydroxyzine, meclizine,and tramadol to [facility]. Pt last seen 09/23/2014.

## 2014-11-08 NOTE — Telephone Encounter (Signed)
Pt called for status of tramadol refill. Medication phoned to [place] at [employer] [facility] as instructed.pt notified refill done.

## 2014-11-11 ENCOUNTER — Encounter: Payer: Self-pay | Admitting: Cardiology

## 2014-12-01 ENCOUNTER — Other Ambulatory Visit: Payer: Self-pay | Admitting: Family Medicine

## 2014-12-01 NOTE — Telephone Encounter (Signed)
Ok to refill 

## 2014-12-01 NOTE — Telephone Encounter (Signed)
plz phone in. 

## 2014-12-02 ENCOUNTER — Other Ambulatory Visit: Payer: Self-pay

## 2014-12-02 ENCOUNTER — Ambulatory Visit: Payer: Medicare Other | Admitting: Family Medicine

## 2014-12-02 NOTE — Telephone Encounter (Signed)
Med have been called into [facility]

## 2014-12-09 ENCOUNTER — Ambulatory Visit: Payer: Medicare Other | Admitting: Family Medicine

## 2014-12-23 ENCOUNTER — Ambulatory Visit: Payer: Medicare Other | Admitting: Family Medicine

## 2014-12-28 ENCOUNTER — Other Ambulatory Visit: Payer: Self-pay | Admitting: Family Medicine

## 2014-12-29 NOTE — Telephone Encounter (Signed)
Ok to refill 

## 2015-01-12 ENCOUNTER — Encounter: Payer: Self-pay | Admitting: Cardiology

## 2015-01-31 ENCOUNTER — Other Ambulatory Visit: Payer: Self-pay | Admitting: Family Medicine

## 2015-01-31 NOTE — Telephone Encounter (Signed)
plz phone in. 

## 2015-01-31 NOTE — Telephone Encounter (Signed)
Ok to refill 

## 2015-02-01 NOTE — Telephone Encounter (Signed)
Rx called in as directed.   

## 2015-02-09 ENCOUNTER — Other Ambulatory Visit: Payer: Medicare Other

## 2015-02-28 ENCOUNTER — Other Ambulatory Visit: Payer: Self-pay | Admitting: Family Medicine

## 2015-02-28 NOTE — Telephone Encounter (Signed)
Rx called in as directed.   

## 2015-02-28 NOTE — Telephone Encounter (Signed)
plz phone in. 

## 2015-03-10 ENCOUNTER — Other Ambulatory Visit: Payer: Medicare Other

## 2015-03-29 ENCOUNTER — Other Ambulatory Visit: Payer: Self-pay | Admitting: Family Medicine

## 2015-03-29 NOTE — Telephone Encounter (Signed)
Ok to refill 

## 2015-03-29 NOTE — Telephone Encounter (Signed)
plz phone in. plz schedule medicare wellness visit as overdue.

## 2015-03-30 NOTE — Telephone Encounter (Signed)
Rx called in as directed. Message left notifying patient to call and schedule wellness exam.

## 2015-03-31 ENCOUNTER — Other Ambulatory Visit (INDEPENDENT_AMBULATORY_CARE_PROVIDER_SITE_OTHER): Payer: Medicare Other

## 2015-03-31 DIAGNOSIS — I10 Essential (primary) hypertension: Secondary | ICD-10-CM

## 2015-03-31 DIAGNOSIS — E785 Hyperlipidemia, unspecified: Secondary | ICD-10-CM | POA: Diagnosis not present

## 2015-03-31 NOTE — Addendum Note (Signed)
Addended by: Eulis Foster on: 03/31/2015 09:05 AM   Modules accepted: Orders

## 2015-04-14 ENCOUNTER — Other Ambulatory Visit: Payer: Medicare Other

## 2015-04-18 ENCOUNTER — Other Ambulatory Visit: Payer: Self-pay

## 2015-04-18 NOTE — Patient Outreach (Signed)
[facility]) Care Management  04/18/2015  NAO CALLICOAT 02-May-1945 [id number]   Telephone Screen  Referral Date: 04/18/15 Referral Source: Next[employer] tier 4 list Referral Reason: 4-admissions, 1-ambulance encounter, 2-ER visits, 1-SNF claims, CHF"   Outreach attempt # 1 to patient. Phone did not ring-went straight to voicemail. Recording on voicemail states other names other than patient. No message left.   Plan: RN CM will make outreach attempt to patient with a week.  Enzo Montgomery, RN,BSN,CCM [place] Management Telephonic Care Management Coordinator Direct Phone: [phone] Toll Free: [phone] Fax: [phone]

## 2015-04-20 ENCOUNTER — Other Ambulatory Visit: Payer: Self-pay

## 2015-04-20 ENCOUNTER — Ambulatory Visit: Payer: Self-pay
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
## 2015-04-21 ENCOUNTER — Other Ambulatory Visit: Payer: Medicare Other

## 2015-04-24 ENCOUNTER — Other Ambulatory Visit: Payer: Self-pay | Admitting: Family Medicine

## 2015-04-24 NOTE — Telephone Encounter (Signed)
plz phone in. plz schedule CPE 

## 2015-04-24 NOTE — Telephone Encounter (Signed)
Ok to refill 

## 2015-04-25 ENCOUNTER — Ambulatory Visit: Payer: Self-pay

## 2015-04-25 ENCOUNTER — Other Ambulatory Visit: Payer: Self-pay

## 2015-04-25 ENCOUNTER — Other Ambulatory Visit: Payer: Self-pay | Admitting: Family Medicine

## 2015-04-25 NOTE — Patient Outreach (Signed)
[facility] [facility]) Care Management  04/25/2015  Maria Fox [birth year] [id number]    Telephone Screen  Referral Date: 04/18/15 Referral Source: Next[employer] tier 4 list Referral Reason: 4-admissions, 1-ambulance encounter, 2-ER visits, 1-SNF claims, CHF"   Outreach attempt # 3 to patient. No answer at mobile number. RN CM left HIPAA compliant voicemail message along with contact info.   Plan: RN CM will send unsuccessful outreach letter to patient. RN CM will close case if no response from patient within 10 business days.   Enzo Montgomery, RN,BSN,CCM [place] Management Telephonic Care Management Coordinator Direct Phone: [phone] Toll Free: [phone] Fax: [phone]

## 2015-04-25 NOTE — Telephone Encounter (Signed)
Rx called in as directed. CPE has already been scheduled.

## 2015-04-28 ENCOUNTER — Other Ambulatory Visit: Payer: Medicare Other

## 2015-05-02 ENCOUNTER — Other Ambulatory Visit: Payer: Self-pay

## 2015-05-02 NOTE — Telephone Encounter (Signed)
Pt left v/m requesting refill gabapentin 100 mg to [facility]. Last refilled # 90 on 11/07/14. Pt last f/u 09/23/14 and pt has CPX scheduled on 05/19/15.Please advise.

## 2015-05-04 MED ORDER — GABAPENTIN 100 MG PO CAPS: 100.0000 mg | ORAL_CAPSULE | Freq: Three times a day (TID) | ORAL | Status: DC

## 2015-05-08 ENCOUNTER — Ambulatory Visit: Payer: Medicare Other | Admitting: Cardiology

## 2015-05-09 ENCOUNTER — Other Ambulatory Visit: Payer: Self-pay

## 2015-05-09 ENCOUNTER — Encounter: Payer: Self-pay | Admitting: Family Medicine

## 2015-05-09 NOTE — Patient Outreach (Signed)
[place]) Care Management  05/09/2015  Maria Fox [birth year] [id number]   Telephone Screen  Referral Date: 04/18/15 Referral Source: NextGen tier 4 list Referral Reason: 4-admissions, 1-ambulance encounter, 2-ER visits, 1-SNF claims, CHF"   Multiple attempts to establish contact with patient. No response from unsuccessful outreach letter mailed to patient.   Plan: RN CM will notify [facility] administrative assistant of case closure. RN CM will send MD case closure letter.  Enzo Montgomery, RN,BSN,CCM [place] Management Telephonic Care Management Coordinator Direct Phone: [phone] Toll Free: [phone] Fax: [phone]

## 2015-05-10 ENCOUNTER — Other Ambulatory Visit: Payer: Self-pay | Admitting: Family Medicine

## 2015-05-10 DIAGNOSIS — E1165 Type 2 diabetes mellitus with hyperglycemia: Secondary | ICD-10-CM

## 2015-05-10 DIAGNOSIS — E785 Hyperlipidemia, unspecified: Secondary | ICD-10-CM

## 2015-05-10 DIAGNOSIS — D509 Iron deficiency anemia, unspecified: Secondary | ICD-10-CM

## 2015-05-10 DIAGNOSIS — E1140 Type 2 diabetes mellitus with diabetic neuropathy, unspecified: Secondary | ICD-10-CM

## 2015-05-10 DIAGNOSIS — IMO0002 Reserved for concepts with insufficient information to code with codable children: Secondary | ICD-10-CM

## 2015-05-10 DIAGNOSIS — E559 Vitamin D deficiency, unspecified: Secondary | ICD-10-CM

## 2015-05-10 DIAGNOSIS — K760 Fatty (change of) liver, not elsewhere classified: Secondary | ICD-10-CM

## 2015-05-10 DIAGNOSIS — I10 Essential (primary) hypertension: Secondary | ICD-10-CM

## 2015-05-12 ENCOUNTER — Other Ambulatory Visit: Payer: Medicare Other

## 2015-05-19 ENCOUNTER — Encounter: Payer: Medicare Other | Admitting: Family Medicine

## 2015-05-26 ENCOUNTER — Other Ambulatory Visit: Payer: Self-pay | Admitting: Family Medicine

## 2015-05-26 ENCOUNTER — Telehealth: Payer: Self-pay | Admitting: Family Medicine

## 2015-05-26 ENCOUNTER — Other Ambulatory Visit: Payer: Medicare Other

## 2015-05-26 NOTE — Telephone Encounter (Signed)
LM for pt to sch AWV with Lesia on 4/21 at 9 with Lesia. mn

## 2015-05-29 NOTE — Telephone Encounter (Signed)
Ok to refill in Dr. Synthia Innocent absence? Last filled 04/24/15 #30 0RF. Please send back to me. Thanks!

## 2015-05-29 NOTE — Telephone Encounter (Signed)
Ok to ref 30, 0 ref 

## 2015-05-29 NOTE — Telephone Encounter (Signed)
Rx called in as directed.   

## 2015-06-08 ENCOUNTER — Other Ambulatory Visit: Payer: Self-pay | Admitting: Family Medicine

## 2015-06-16 ENCOUNTER — Ambulatory Visit: Payer: Medicare Other | Admitting: Cardiology

## 2015-06-29 ENCOUNTER — Other Ambulatory Visit: Payer: Self-pay | Admitting: Family Medicine

## 2015-06-29 NOTE — Telephone Encounter (Signed)
Last office visit 09/23/2014.  Last refilled 05/29/2015 for #30 with no refills.  Ok to refill?

## 2015-06-29 NOTE — Telephone Encounter (Signed)
plz phone in. 

## 2015-06-30 ENCOUNTER — Other Ambulatory Visit: Payer: Medicare Other

## 2015-06-30 NOTE — Telephone Encounter (Signed)
Rx called in to requested pharmacy 

## 2015-07-01 ENCOUNTER — Emergency Department (HOSPITAL_COMMUNITY)
Admission: EM | Admit: 2015-07-01 | Discharge: 2015-07-02 | Disposition: A | Discharge status: Home / Self Care | Payer: Medicare Other | Attending: Emergency Medicine | Admitting: Emergency Medicine

## 2015-07-01 ENCOUNTER — Encounter (HOSPITAL_COMMUNITY): Payer: Self-pay | Admitting: *Deleted

## 2015-07-01 DIAGNOSIS — I10 Essential (primary) hypertension: Secondary | ICD-10-CM | POA: Insufficient documentation

## 2015-07-01 DIAGNOSIS — S199XXA Unspecified injury of neck, initial encounter: Secondary | ICD-10-CM | POA: Diagnosis not present

## 2015-07-01 DIAGNOSIS — E785 Hyperlipidemia, unspecified: Secondary | ICD-10-CM | POA: Insufficient documentation

## 2015-07-01 DIAGNOSIS — Z7982 Long term (current) use of aspirin: Secondary | ICD-10-CM | POA: Insufficient documentation

## 2015-07-01 DIAGNOSIS — I5030 Unspecified diastolic (congestive) heart failure: Secondary | ICD-10-CM | POA: Diagnosis not present

## 2015-07-01 DIAGNOSIS — S0292XA Unspecified fracture of facial bones, initial encounter for closed fracture: Secondary | ICD-10-CM | POA: Diagnosis not present

## 2015-07-01 DIAGNOSIS — S02401A Maxillary fracture, unspecified, initial encounter for closed fracture: Secondary | ICD-10-CM | POA: Diagnosis not present

## 2015-07-01 DIAGNOSIS — W01198A Fall on same level from slipping, tripping and stumbling with subsequent striking against other object, initial encounter: Secondary | ICD-10-CM | POA: Insufficient documentation

## 2015-07-01 DIAGNOSIS — Y998 Other external cause status: Secondary | ICD-10-CM | POA: Diagnosis not present

## 2015-07-01 DIAGNOSIS — R040 Epistaxis: Secondary | ICD-10-CM | POA: Diagnosis not present

## 2015-07-01 DIAGNOSIS — Z8619 Personal history of other infectious and parasitic diseases: Secondary | ICD-10-CM | POA: Insufficient documentation

## 2015-07-01 DIAGNOSIS — R791 Abnormal coagulation profile: Secondary | ICD-10-CM | POA: Diagnosis not present

## 2015-07-01 DIAGNOSIS — S0240CA Maxillary fracture, right side, initial encounter for closed fracture: Secondary | ICD-10-CM | POA: Diagnosis not present

## 2015-07-01 DIAGNOSIS — Z7902 Long term (current) use of antithrombotics/antiplatelets: Secondary | ICD-10-CM | POA: Diagnosis not present

## 2015-07-01 DIAGNOSIS — Y9289 Other specified places as the place of occurrence of the external cause: Secondary | ICD-10-CM | POA: Insufficient documentation

## 2015-07-01 DIAGNOSIS — Z7984 Long term (current) use of oral hypoglycemic drugs: Secondary | ICD-10-CM | POA: Diagnosis not present

## 2015-07-01 DIAGNOSIS — Z79899 Other long term (current) drug therapy: Secondary | ICD-10-CM | POA: Diagnosis not present

## 2015-07-01 DIAGNOSIS — S022XXA Fracture of nasal bones, initial encounter for closed fracture: Secondary | ICD-10-CM

## 2015-07-01 DIAGNOSIS — W010XXA Fall on same level from slipping, tripping and stumbling without subsequent striking against object, initial encounter: Secondary | ICD-10-CM

## 2015-07-01 DIAGNOSIS — Z880 Allergy status to penicillin: Secondary | ICD-10-CM | POA: Diagnosis not present

## 2015-07-01 DIAGNOSIS — Y9389 Activity, other specified: Secondary | ICD-10-CM | POA: Insufficient documentation

## 2015-07-01 DIAGNOSIS — Z87440 Personal history of urinary (tract) infections: Secondary | ICD-10-CM | POA: Diagnosis not present

## 2015-07-01 DIAGNOSIS — I2510 Atherosclerotic heart disease of native coronary artery without angina pectoris: Secondary | ICD-10-CM | POA: Diagnosis not present

## 2015-07-01 DIAGNOSIS — E119 Type 2 diabetes mellitus without complications: Secondary | ICD-10-CM | POA: Diagnosis not present

## 2015-07-01 DIAGNOSIS — S0993XA Unspecified injury of face, initial encounter: Secondary | ICD-10-CM | POA: Diagnosis present

## 2015-07-01 LAB — PROTIME-INR
INR: 0.9 (ref 0.00–1.49)
PROTHROMBIN TIME: 12.4 s (ref 11.6–15.2)

## 2015-07-01 NOTE — ED Notes (Signed)
THE PT IS C/O A FALL  SHE TRIPPED AND FELL HEAD FIRST ONTO CONCRETE.  NO LOC  SHE HAS CLOTS IN HER RT NOSTRIL  WITHN BRUISES ON THE BRIDGE OF HER NOSE.  SHE TAKES COOUMADIN  NO LOOSE TEETH

## 2015-07-01 NOTE — ED Notes (Signed)
Bleeding is profuse from her nose
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
## 2015-07-02 ENCOUNTER — Emergency Department (HOSPITAL_COMMUNITY): Payer: Medicare Other

## 2015-07-02 DIAGNOSIS — S02401A Maxillary fracture, unspecified, initial encounter for closed fracture: Secondary | ICD-10-CM | POA: Diagnosis not present

## 2015-07-02 DIAGNOSIS — S022XXA Fracture of nasal bones, initial encounter for closed fracture: Secondary | ICD-10-CM | POA: Diagnosis not present

## 2015-07-02 DIAGNOSIS — S199XXA Unspecified injury of neck, initial encounter: Secondary | ICD-10-CM | POA: Diagnosis not present

## 2015-07-02 MED ORDER — SODIUM CHLORIDE 0.9 % IV BOLUS (SEPSIS)
1000.0000 mL | Freq: Once | INTRAVENOUS | Status: AC
  Administered 2015-07-02: 1000 mL via INTRAVENOUS

## 2015-07-02 MED ORDER — ONDANSETRON 4 MG PO TBDP: ORAL_TABLET | ORAL | Status: DC

## 2015-07-02 MED ORDER — METOCLOPRAMIDE HCL 5 MG/ML IJ SOLN
10.0000 mg | Freq: Once | INTRAMUSCULAR | Status: AC
  Administered 2015-07-02: 10 mg via INTRAVENOUS
  Filled 2015-07-02: qty 2

## 2015-07-02 MED ORDER — HYDROCODONE-ACETAMINOPHEN 5-325 MG PO TABS: 1.0000 | ORAL_TABLET | ORAL | Status: DC | PRN

## 2015-07-02 MED ORDER — ONDANSETRON HCL 4 MG/2ML IJ SOLN
4.0000 mg | Freq: Once | INTRAMUSCULAR | Status: AC
  Administered 2015-07-02: 4 mg via INTRAVENOUS
  Filled 2015-07-02: qty 2

## 2015-07-02 MED ORDER — DIPHENHYDRAMINE HCL 50 MG/ML IJ SOLN
25.0000 mg | Freq: Once | INTRAMUSCULAR | Status: AC
  Administered 2015-07-02: 25 mg via INTRAVENOUS
  Filled 2015-07-02: qty 1

## 2015-07-02 MED ORDER — HYDROCODONE-ACETAMINOPHEN 5-325 MG PO TABS
2.0000 | ORAL_TABLET | Freq: Once | ORAL | Status: AC
  Administered 2015-07-02: 2 via ORAL
  Filled 2015-07-02: qty 2

## 2015-07-02 MED ORDER — CEPHALEXIN 500 MG PO CAPS: 500.0000 mg | ORAL_CAPSULE | Freq: Four times a day (QID) | ORAL | Status: DC

## 2015-07-02 NOTE — ED Notes (Signed)
Patient returned from CT due to the fact their were unable to have her lay down for the tests.  Dr Roxanne Mins aware

## 2015-07-02 NOTE — ED Provider Notes (Signed)
Dr. Janace Hoard of ENT has evaluated. Bleeding essentially stopped. Recommends keeping packing in and follow-up in the clinic in 5 days for packing removal. Recommends antibiotics. Has penicillin allergy but has had Keflex before without difficulty. We'll place on Keflex. Has codeine allergy but has had Norco before without difficulty. Tolerated well here. Drink fluids well. No indication for admission. Tachycardia has improved since arrival and on my last exam was in the low 100s. Stable for discharge home, strict return precautions especially with increased bleeding.  [name], MD 07/02/15 [phone]

## 2015-07-02 NOTE — ED Notes (Signed)
ENT at bedside
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
## 2015-07-02 NOTE — ED Notes (Signed)
Combined ENT tray to bedside
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
## 2015-07-02 NOTE — ED Notes (Signed)
Patient taken back to CT  Told CT that the patient was not vomiting but clearing her throat and spitting up the blood

## 2015-07-07 ENCOUNTER — Encounter: Payer: Medicare Other | Admitting: Family Medicine

## 2015-07-07 DIAGNOSIS — Z09 Encounter for follow-up examination after completed treatment for conditions other than malignant neoplasm: Secondary | ICD-10-CM | POA: Diagnosis not present

## 2015-07-07 DIAGNOSIS — Z8669 Personal history of other diseases of the nervous system and sense organs: Secondary | ICD-10-CM | POA: Diagnosis not present

## 2015-07-07 DIAGNOSIS — R040 Epistaxis: Secondary | ICD-10-CM | POA: Insufficient documentation

## 2015-07-09 ENCOUNTER — Encounter (HOSPITAL_COMMUNITY): Payer: Self-pay | Admitting: *Deleted

## 2015-07-09 ENCOUNTER — Observation Stay (HOSPITAL_COMMUNITY): Payer: Medicare Other

## 2015-07-09 ENCOUNTER — Inpatient Hospital Stay (HOSPITAL_COMMUNITY)
Admission: EM | Admit: 2015-07-09 | Discharge: 2015-07-18 | DRG: 981 | Disposition: A | Discharge status: Home / Self Care | Payer: Medicare Other | Attending: Internal Medicine | Admitting: Internal Medicine

## 2015-07-09 DIAGNOSIS — I951 Orthostatic hypotension: Secondary | ICD-10-CM | POA: Diagnosis present

## 2015-07-09 DIAGNOSIS — R0902 Hypoxemia: Secondary | ICD-10-CM | POA: Diagnosis present

## 2015-07-09 DIAGNOSIS — E1165 Type 2 diabetes mellitus with hyperglycemia: Secondary | ICD-10-CM | POA: Diagnosis present

## 2015-07-09 DIAGNOSIS — L80 Vitiligo: Secondary | ICD-10-CM | POA: Diagnosis present

## 2015-07-09 DIAGNOSIS — R05 Cough: Secondary | ICD-10-CM | POA: Diagnosis not present

## 2015-07-09 DIAGNOSIS — E860 Dehydration: Secondary | ICD-10-CM | POA: Diagnosis present

## 2015-07-09 DIAGNOSIS — Z7902 Long term (current) use of antithrombotics/antiplatelets: Secondary | ICD-10-CM

## 2015-07-09 DIAGNOSIS — I2510 Atherosclerotic heart disease of native coronary artery without angina pectoris: Secondary | ICD-10-CM | POA: Diagnosis present

## 2015-07-09 DIAGNOSIS — E1169 Type 2 diabetes mellitus with other specified complication: Secondary | ICD-10-CM | POA: Diagnosis present

## 2015-07-09 DIAGNOSIS — J9600 Acute respiratory failure, unspecified whether with hypoxia or hypercapnia: Secondary | ICD-10-CM | POA: Insufficient documentation

## 2015-07-09 DIAGNOSIS — Z7984 Long term (current) use of oral hypoglycemic drugs: Secondary | ICD-10-CM

## 2015-07-09 DIAGNOSIS — D509 Iron deficiency anemia, unspecified: Secondary | ICD-10-CM | POA: Diagnosis present

## 2015-07-09 DIAGNOSIS — E785 Hyperlipidemia, unspecified: Secondary | ICD-10-CM | POA: Diagnosis present

## 2015-07-09 DIAGNOSIS — I110 Hypertensive heart disease with heart failure: Secondary | ICD-10-CM | POA: Diagnosis present

## 2015-07-09 DIAGNOSIS — I639 Cerebral infarction, unspecified: Secondary | ICD-10-CM

## 2015-07-09 DIAGNOSIS — I63532 Cerebral infarction due to unspecified occlusion or stenosis of left posterior cerebral artery: Secondary | ICD-10-CM | POA: Diagnosis not present

## 2015-07-09 DIAGNOSIS — I252 Old myocardial infarction: Secondary | ICD-10-CM

## 2015-07-09 DIAGNOSIS — R296 Repeated falls: Secondary | ICD-10-CM | POA: Diagnosis present

## 2015-07-09 DIAGNOSIS — Z7982 Long term (current) use of aspirin: Secondary | ICD-10-CM

## 2015-07-09 DIAGNOSIS — E1143 Type 2 diabetes mellitus with diabetic autonomic (poly)neuropathy: Secondary | ICD-10-CM | POA: Diagnosis not present

## 2015-07-09 DIAGNOSIS — Z79899 Other long term (current) drug therapy: Secondary | ICD-10-CM

## 2015-07-09 DIAGNOSIS — N179 Acute kidney failure, unspecified: Secondary | ICD-10-CM | POA: Diagnosis not present

## 2015-07-09 DIAGNOSIS — I5032 Chronic diastolic (congestive) heart failure: Secondary | ICD-10-CM | POA: Diagnosis not present

## 2015-07-09 DIAGNOSIS — S02401A Maxillary fracture, unspecified, initial encounter for closed fracture: Secondary | ICD-10-CM | POA: Diagnosis present

## 2015-07-09 DIAGNOSIS — R531 Weakness: Secondary | ICD-10-CM | POA: Diagnosis not present

## 2015-07-09 DIAGNOSIS — R42 Dizziness and giddiness: Secondary | ICD-10-CM

## 2015-07-09 DIAGNOSIS — K3184 Gastroparesis: Secondary | ICD-10-CM | POA: Diagnosis present

## 2015-07-09 DIAGNOSIS — Z951 Presence of aortocoronary bypass graft: Secondary | ICD-10-CM

## 2015-07-09 DIAGNOSIS — Z8673 Personal history of transient ischemic attack (TIA), and cerebral infarction without residual deficits: Secondary | ICD-10-CM

## 2015-07-09 DIAGNOSIS — E611 Iron deficiency: Secondary | ICD-10-CM | POA: Diagnosis present

## 2015-07-09 DIAGNOSIS — H53461 Homonymous bilateral field defects, right side: Secondary | ICD-10-CM | POA: Diagnosis not present

## 2015-07-09 DIAGNOSIS — W19XXXA Unspecified fall, initial encounter: Secondary | ICD-10-CM | POA: Diagnosis present

## 2015-07-09 DIAGNOSIS — E1149 Type 2 diabetes mellitus with other diabetic neurological complication: Secondary | ICD-10-CM | POA: Diagnosis present

## 2015-07-09 DIAGNOSIS — I10 Essential (primary) hypertension: Secondary | ICD-10-CM

## 2015-07-09 DIAGNOSIS — J189 Pneumonia, unspecified organism: Secondary | ICD-10-CM

## 2015-07-09 HISTORY — DX: Maxillary fracture, unspecified side, initial encounter for closed fracture: S02.401A

## 2015-07-09 LAB — TSH: TSH: 1.247 u[IU]/mL (ref 0.350–4.500)

## 2015-07-09 LAB — URINE MICROSCOPIC-ADD ON: RBC / HPF: NONE SEEN RBC/hpf (ref 0–5)

## 2015-07-09 LAB — URINALYSIS, ROUTINE W REFLEX MICROSCOPIC
Glucose, UA: 100 mg/dL — AB
Hgb urine dipstick: NEGATIVE
Ketones, ur: 15 mg/dL — AB
NITRITE: NEGATIVE
PH: 5 (ref 5.0–8.0)
Protein, ur: 30 mg/dL — AB
SPECIFIC GRAVITY, URINE: 1.027 (ref 1.005–1.030)

## 2015-07-09 LAB — CBC
HEMATOCRIT: 27.5 % — AB (ref 36.0–46.0)
HEMOGLOBIN: 9.3 g/dL — AB (ref 12.0–15.0)
MCH: 30.2 pg (ref 26.0–34.0)
MCHC: 33.8 g/dL (ref 30.0–36.0)
MCV: 89.3 fL (ref 78.0–100.0)
Platelets: 341 10*3/uL (ref 150–400)
RBC: 3.08 MIL/uL — AB (ref 3.87–5.11)
RDW: 14 % (ref 11.5–15.5)
WBC: 7.8 10*3/uL (ref 4.0–10.5)

## 2015-07-09 LAB — RAPID URINE DRUG SCREEN, HOSP PERFORMED
AMPHETAMINES: NOT DETECTED
BENZODIAZEPINES: NOT DETECTED
Barbiturates: NOT DETECTED
COCAINE: NOT DETECTED
OPIATES: POSITIVE — AB
TETRAHYDROCANNABINOL: NOT DETECTED

## 2015-07-09 LAB — BASIC METABOLIC PANEL
Anion gap: 11 (ref 5–15)
BUN: 13 mg/dL (ref 6–20)
CHLORIDE: 106 mmol/L (ref 101–111)
CO2: 20 mmol/L — ABNORMAL LOW (ref 22–32)
Calcium: 9.5 mg/dL (ref 8.9–10.3)
Creatinine, Ser: 1.49 mg/dL — ABNORMAL HIGH (ref 0.44–1.00)
GFR calc Af Amer: 39 mL/min — ABNORMAL LOW (ref >60)
GFR, EST NON AFRICAN AMERICAN: 34 mL/min — AB (ref >60)
Glucose, Bld: 210 mg/dL — ABNORMAL HIGH (ref 65–99)
Potassium: 3.7 mmol/L (ref 3.5–5.1)
SODIUM: 137 mmol/L (ref 135–145)

## 2015-07-09 LAB — RETICULOCYTES
RBC.: 2.7 MIL/uL — ABNORMAL LOW (ref 3.87–5.11)
Retic Count, Absolute: 83.7 10*3/uL (ref 19.0–186.0)
Retic Ct Pct: 3.1 % (ref 0.4–3.1)

## 2015-07-09 LAB — TROPONIN I
TROPONIN I: 0.03 ng/mL (ref <0.031)
Troponin I: <0.03 ng/mL (ref <0.031)

## 2015-07-09 LAB — IRON AND TIBC
IRON: 14 ug/dL — AB (ref 28–170)
Saturation Ratios: 6 % — ABNORMAL LOW (ref 10.4–31.8)
TIBC: 252 ug/dL (ref 250–450)
UIBC: 238 ug/dL

## 2015-07-09 LAB — VITAMIN B12: Vitamin B-12: 191 pg/mL (ref 180–914)

## 2015-07-09 LAB — CBG MONITORING, ED
GLUCOSE-CAPILLARY: 213 mg/dL — AB (ref 65–99)
GLUCOSE-CAPILLARY: 153 mg/dL — AB (ref 65–99)
GLUCOSE-CAPILLARY: 105 mg/dL — AB (ref 65–99)
GLUCOSE-CAPILLARY: 98 mg/dL (ref 65–99)

## 2015-07-09 LAB — LIPID PANEL
Cholesterol: 162 mg/dL (ref 0–200)
HDL: 30 mg/dL — ABNORMAL LOW (ref >40)
LDL CALC: 95 mg/dL (ref 0–99)
TRIGLYCERIDES: 183 mg/dL — AB (ref <150)
Total CHOL/HDL Ratio: 5.4 RATIO
VLDL: 37 mg/dL (ref 0–40)

## 2015-07-09 LAB — GLUCOSE, CAPILLARY: GLUCOSE-CAPILLARY: 120 mg/dL — AB (ref 65–99)

## 2015-07-09 LAB — FOLATE: FOLATE: 13.9 ng/mL (ref >5.9)

## 2015-07-09 LAB — FERRITIN: FERRITIN: 11 ng/mL (ref 11–307)

## 2015-07-09 MED ORDER — MIDODRINE HCL 5 MG PO TABS
10.0000 mg | ORAL_TABLET | Freq: Three times a day (TID) | ORAL | Status: DC
  Administered 2015-07-09 – 2015-07-18 (×25): 10 mg via ORAL
  Filled 2015-07-09 (×26): qty 2

## 2015-07-09 MED ORDER — SODIUM CHLORIDE 0.9 % IV BOLUS (SEPSIS)
1000.0000 mL | Freq: Once | INTRAVENOUS | Status: AC
  Administered 2015-07-09: 1000 mL via INTRAVENOUS

## 2015-07-09 MED ORDER — INSULIN ASPART 100 UNIT/ML ~~LOC~~ SOLN
0.0000 [IU] | Freq: Three times a day (TID) | SUBCUTANEOUS | Status: DC
  Administered 2015-07-09: 3 [IU] via SUBCUTANEOUS
  Administered 2015-07-10: 2 [IU] via SUBCUTANEOUS
  Administered 2015-07-11: 5 [IU] via SUBCUTANEOUS
  Administered 2015-07-11: 2 [IU] via SUBCUTANEOUS
  Administered 2015-07-12: 5 [IU] via SUBCUTANEOUS
  Administered 2015-07-13: 2 [IU] via SUBCUTANEOUS
  Administered 2015-07-13 – 2015-07-15 (×4): 3 [IU] via SUBCUTANEOUS
  Administered 2015-07-15: 2 [IU] via SUBCUTANEOUS
  Administered 2015-07-16: 3 [IU] via SUBCUTANEOUS
  Administered 2015-07-17: 2 [IU] via SUBCUTANEOUS
  Administered 2015-07-18 (×2): 3 [IU] via SUBCUTANEOUS
  Filled 2015-07-09: qty 1

## 2015-07-09 MED ORDER — METOPROLOL TARTRATE 12.5 MG HALF TABLET
12.5000 mg | ORAL_TABLET | Freq: Two times a day (BID) | ORAL | Status: DC
  Administered 2015-07-09 – 2015-07-17 (×17): 12.5 mg via ORAL
  Filled 2015-07-09 (×18): qty 1

## 2015-07-09 MED ORDER — FOLIC ACID 1 MG PO TABS
1.0000 mg | ORAL_TABLET | Freq: Every day | ORAL | Status: DC
  Administered 2015-07-09 – 2015-07-18 (×10): 1 mg via ORAL
  Filled 2015-07-09 (×10): qty 1

## 2015-07-09 MED ORDER — FERROUS SULFATE 325 (65 FE) MG PO TABS
325.0000 mg | ORAL_TABLET | Freq: Every day | ORAL | Status: DC
  Administered 2015-07-09 – 2015-07-18 (×10): 325 mg via ORAL
  Filled 2015-07-09 (×10): qty 1

## 2015-07-09 MED ORDER — ONDANSETRON HCL 4 MG/2ML IJ SOLN: 4.0000 mg | Freq: Four times a day (QID) | INTRAMUSCULAR | Status: DC | PRN

## 2015-07-09 MED ORDER — INSULIN ASPART 100 UNIT/ML ~~LOC~~ SOLN
0.0000 [IU] | Freq: Every day | SUBCUTANEOUS | Status: DC
  Administered 2015-07-13 – 2015-07-17 (×2): 2 [IU] via SUBCUTANEOUS

## 2015-07-09 MED ORDER — ACETAMINOPHEN 650 MG RE SUPP: 650.0000 mg | Freq: Four times a day (QID) | RECTAL | Status: DC | PRN

## 2015-07-09 MED ORDER — CLOPIDOGREL BISULFATE 75 MG PO TABS
75.0000 mg | ORAL_TABLET | Freq: Every day | ORAL | Status: DC
  Administered 2015-07-09 – 2015-07-18 (×10): 75 mg via ORAL
  Filled 2015-07-09 (×10): qty 1

## 2015-07-09 MED ORDER — MECLIZINE HCL 12.5 MG PO TABS: 25.0000 mg | ORAL_TABLET | Freq: Three times a day (TID) | ORAL | Status: DC | PRN

## 2015-07-09 MED ORDER — ENSURE ENLIVE PO LIQD
237.0000 mL | Freq: Three times a day (TID) | ORAL | Status: DC
  Administered 2015-07-09 – 2015-07-18 (×20): 237 mL via ORAL
  Filled 2015-07-09 (×3): qty 237

## 2015-07-09 MED ORDER — SODIUM CHLORIDE 0.9% FLUSH
3.0000 mL | Freq: Two times a day (BID) | INTRAVENOUS | Status: DC
  Administered 2015-07-09 – 2015-07-17 (×15): 3 mL via INTRAVENOUS

## 2015-07-09 MED ORDER — HYDROXYZINE HCL 10 MG PO TABS
10.0000 mg | ORAL_TABLET | Freq: Three times a day (TID) | ORAL | Status: DC | PRN
  Filled 2015-07-09: qty 1

## 2015-07-09 MED ORDER — DOCUSATE SODIUM 100 MG PO CAPS
100.0000 mg | ORAL_CAPSULE | Freq: Two times a day (BID) | ORAL | Status: DC
  Administered 2015-07-09 – 2015-07-18 (×18): 100 mg via ORAL
  Filled 2015-07-09 (×18): qty 1

## 2015-07-09 MED ORDER — ONDANSETRON HCL 4 MG PO TABS
4.0000 mg | ORAL_TABLET | Freq: Four times a day (QID) | ORAL | Status: DC | PRN
Start: 2015-07-09 — End: 2015-07-18

## 2015-07-09 MED ORDER — ACETAMINOPHEN 325 MG PO TABS: 650.0000 mg | ORAL_TABLET | Freq: Four times a day (QID) | ORAL | Status: DC | PRN

## 2015-07-09 MED ORDER — BISACODYL 5 MG PO TBEC
5.0000 mg | DELAYED_RELEASE_TABLET | Freq: Every day | ORAL | Status: DC | PRN
  Filled 2015-07-09: qty 1

## 2015-07-09 MED ORDER — HYDROCODONE-ACETAMINOPHEN 5-325 MG PO TABS: 1.0000 | ORAL_TABLET | Freq: Four times a day (QID) | ORAL | Status: DC | PRN

## 2015-07-09 MED ORDER — METOCLOPRAMIDE HCL 10 MG PO TABS
10.0000 mg | ORAL_TABLET | Freq: Three times a day (TID) | ORAL | Status: DC
  Administered 2015-07-09 – 2015-07-18 (×34): 10 mg via ORAL
  Filled 2015-07-09 (×35): qty 1

## 2015-07-09 MED ORDER — FAMOTIDINE 20 MG PO TABS
20.0000 mg | ORAL_TABLET | Freq: Every day | ORAL | Status: DC
  Administered 2015-07-09 – 2015-07-17 (×9): 20 mg via ORAL
  Filled 2015-07-09 (×9): qty 1

## 2015-07-09 MED ORDER — GABAPENTIN 100 MG PO CAPS
100.0000 mg | ORAL_CAPSULE | Freq: Three times a day (TID) | ORAL | Status: DC
  Administered 2015-07-09 – 2015-07-18 (×27): 100 mg via ORAL
  Filled 2015-07-09 (×26): qty 1

## 2015-07-09 MED ORDER — ATORVASTATIN CALCIUM 10 MG PO TABS
20.0000 mg | ORAL_TABLET | Freq: Every day | ORAL | Status: DC
  Administered 2015-07-09 – 2015-07-15 (×7): 20 mg via ORAL
  Administered 2015-07-16: 10 mg via ORAL
  Filled 2015-07-09: qty 1
  Filled 2015-07-09: qty 2
  Filled 2015-07-09 (×2): qty 1
  Filled 2015-07-09: qty 2
  Filled 2015-07-09 (×2): qty 1
  Filled 2015-07-09: qty 2

## 2015-07-09 MED ORDER — ASPIRIN EC 81 MG PO TBEC
81.0000 mg | DELAYED_RELEASE_TABLET | Freq: Every day | ORAL | Status: DC
  Administered 2015-07-09 – 2015-07-18 (×10): 81 mg via ORAL
  Filled 2015-07-09 (×10): qty 1

## 2015-07-09 MED ORDER — SERTRALINE HCL 50 MG PO TABS
75.0000 mg | ORAL_TABLET | Freq: Every day | ORAL | Status: DC
  Administered 2015-07-09 – 2015-07-18 (×10): 75 mg via ORAL
  Filled 2015-07-09: qty 2
  Filled 2015-07-09 (×2): qty 1
  Filled 2015-07-09 (×2): qty 2
  Filled 2015-07-09 (×3): qty 1
  Filled 2015-07-09 (×2): qty 2

## 2015-07-09 MED ORDER — SODIUM CHLORIDE 0.9 % IV SOLN
INTRAVENOUS | Status: DC
  Administered 2015-07-09: 08:00:00 via INTRAVENOUS

## 2015-07-09 NOTE — ED Notes (Signed)
Patient transported to X-ray 

## 2015-07-09 NOTE — ED Notes (Signed)
The pt is here c/o   Weakness and  Almost passing out for 2-3 days she is also c/o a headache.  Last week she fell and she has had a headache since then  Alert  No distress
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
## 2015-07-09 NOTE — ED Notes (Signed)
Attempted report 
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
## 2015-07-09 NOTE — ED Notes (Signed)
Paitents O2 is 89% while ambulating.

## 2015-07-09 NOTE — ED Notes (Signed)
CBG was 213, RN notified

## 2015-07-10 DIAGNOSIS — N179 Acute kidney failure, unspecified: Secondary | ICD-10-CM | POA: Diagnosis not present

## 2015-07-10 DIAGNOSIS — D509 Iron deficiency anemia, unspecified: Secondary | ICD-10-CM | POA: Diagnosis not present

## 2015-07-10 DIAGNOSIS — I951 Orthostatic hypotension: Secondary | ICD-10-CM

## 2015-07-10 HISTORY — DX: Orthostatic hypotension: I95.1

## 2015-07-10 LAB — BASIC METABOLIC PANEL
ANION GAP: 9 (ref 5–15)
BUN: 12 mg/dL (ref 6–20)
CHLORIDE: 111 mmol/L (ref 101–111)
CO2: 21 mmol/L — AB (ref 22–32)
CREATININE: 1.13 mg/dL — AB (ref 0.44–1.00)
Calcium: 8.6 mg/dL — ABNORMAL LOW (ref 8.9–10.3)
GFR calc non Af Amer: 47 mL/min — ABNORMAL LOW (ref >60)
GFR, EST AFRICAN AMERICAN: 55 mL/min — AB (ref >60)
Glucose, Bld: 114 mg/dL — ABNORMAL HIGH (ref 65–99)
Potassium: 4.2 mmol/L (ref 3.5–5.1)
Sodium: 141 mmol/L (ref 135–145)

## 2015-07-10 LAB — CBC
HCT: 24.7 % — ABNORMAL LOW (ref 36.0–46.0)
HEMOGLOBIN: 8.2 g/dL — AB (ref 12.0–15.0)
MCH: 29.9 pg (ref 26.0–34.0)
MCHC: 33.2 g/dL (ref 30.0–36.0)
MCV: 90.1 fL (ref 78.0–100.0)
Platelets: 299 10*3/uL (ref 150–400)
RBC: 2.74 MIL/uL — AB (ref 3.87–5.11)
RDW: 14 % (ref 11.5–15.5)
WBC: 6.1 10*3/uL (ref 4.0–10.5)

## 2015-07-10 LAB — GLUCOSE, CAPILLARY
GLUCOSE-CAPILLARY: 129 mg/dL — AB (ref 65–99)
GLUCOSE-CAPILLARY: 118 mg/dL — AB (ref 65–99)
Glucose-Capillary: 101 mg/dL — ABNORMAL HIGH (ref 65–99)

## 2015-07-10 LAB — HEMOGLOBIN A1C
HEMOGLOBIN A1C: 4.9 % (ref 4.8–5.6)
Mean Plasma Glucose: 94 mg/dL

## 2015-07-10 NOTE — Evaluation (Addendum)
Physical Therapy Evaluation Patient Details Name: Maria Fox MRN: [id number] DOB: 11/15/1945 Today's Date: 07/10/2015   History of Present Illness  Pt adm with acute kidney injury and borderline orthostatic hypotension. PMH - orthostatic hypotension, HTN, DM, CVA, CABG, PNA  Clinical Impression  Pt admitted with above diagnosis and presents to PT with functional limitations due to deficits listed below (See PT problem list). Pt needs skilled PT to maximize independence and safety to allow discharge to home with family support.  Pt currently primarily limited by orthostasis. Once this improves her mobility should improve.  Orthostatic BPs  Supine 143/59  Sitting 142/68  Standing 115/60  Standing after 3 min 107/59       Follow Up Recommendations No PT follow up;Supervision for mobility/OOB (if still orthostatic)    Equipment Recommendations  None recommended by PT    Recommendations for Other Services       Precautions / Restrictions Precautions Precautions: Fall Precaution Comments: watch BP's Restrictions Weight Bearing Restrictions: No      Mobility  Bed Mobility Overal bed mobility: Modified Independent             General bed mobility comments: Incr time  Transfers Overall transfer level: Needs assistance Equipment used: None Transfers: Sit to/from Stand Sit to Stand: Min guard         General transfer comment: For safety  Ambulation/Gait Ambulation/Gait assistance: Min guard Ambulation Distance (Feet): 75 Feet Assistive device: 1 person hand held assist Gait Pattern/deviations: Step-through pattern;Decreased stride length Gait velocity: decr Gait velocity interpretation: Below normal speed for age/gender General Gait Details: Slighty unsteady and tentative gait likely primarily due to orthostasis.  Stairs            Wheelchair Mobility    Modified Rankin (Stroke Patients Only)       Balance Overall balance assessment: Needs  assistance Sitting-balance support: No upper extremity supported;Feet supported Sitting balance-Leahy Scale: Good     Standing balance support: No upper extremity supported Standing balance-Leahy Scale: Fair                               Pertinent Vitals/Pain Pain Assessment: No/denies pain    Home Living Family/patient expects to be discharged to:: Private residence Living Arrangements: Spouse/significant other Available Help at Discharge: Family;Available 24 hours/day Type of Home: House Home Access: Stairs to enter   [employer] of Steps: 2 Home Layout: One level Home Equipment: Walker - 2 wheels;Cane - single point      Prior Function Level of Independence: Independent               Hand Dominance   Dominant Hand: Right    Extremity/Trunk Assessment   Upper Extremity Assessment: Overall WFL for tasks assessed           Lower Extremity Assessment: Generalized weakness         Communication   Communication: No difficulties  Cognition Arousal/Alertness: Awake/alert Behavior During Therapy: WFL for tasks assessed/performed Overall Cognitive Status: Within Functional Limits for tasks assessed                      General Comments      Exercises        Assessment/Plan    PT Assessment Patient needs continued PT services  PT Diagnosis Difficulty walking;Generalized weakness   PT Problem List Decreased strength;Decreased activity tolerance;Decreased balance;Decreased mobility;Cardiopulmonary status limiting activity  PT  Treatment Interventions DME instruction;Gait training;Functional mobility training;Therapeutic activities;Therapeutic exercise;Balance training;Patient/family education   PT Goals (Current goals can be found in the Care Plan section) Acute Rehab PT Goals Patient Stated Goal: return home PT Goal Formulation: With patient Time For Goal Achievement: 07/17/15 Potential to Achieve Goals: Good     Frequency Min 3X/week   Barriers to discharge        Co-evaluation               End of Session Equipment Utilized During Treatment: Gait belt Activity Tolerance: Treatment limited secondary to medical complications (Comment) (orthostatic) Patient left: in chair;with call bell/phone within reach;with chair alarm set;with family/visitor present Nurse Communication: Mobility status;Other (comment) (orthostasis)    Functional Assessment Tool Used: clinical judgement Functional Limitation: Mobility: Walking and moving around Mobility: Walking and Moving Around Current Status [id number]): At least 1 percent but less than 20 percent impaired, limited or restricted Mobility: Walking and Moving Around Goal Status [phone]): 0 percent impaired, limited or restricted    Time: [id number] PT Time Calculation (min) (ACUTE ONLY): 25 min   Charges:   PT Evaluation $PT Eval Moderate Complexity: 1 Procedure PT Treatments $Gait Training: 8-22 mins   PT G Codes:   PT G-Codes **NOT FOR INPATIENT CLASS** Functional Assessment Tool Used: clinical judgement Functional Limitation: Mobility: Walking and moving around Mobility: Walking and Moving Around Current Status [id number]): At least 1 percent but less than 20 percent impaired, limited or restricted Mobility: Walking and Moving Around Goal Status [phone]): 0 percent impaired, limited or restricted    [facility] 07/10/2015, 2:31 PM [facility] PT [phone]

## 2015-07-10 NOTE — Progress Notes (Signed)
Patient ID: Maria Fox, female   DOB: 01-Feb-1946, 70 y.o.   MRN: [id number]    PROGRESS NOTE    ADELAYDE Fox  [name] DOB: Nov 08, 1945 DOA: 07/09/2015  PCP: Maria Bush, MD   Outpatient Specialists:   Brief Narrative:  70 y.o. female with CAD, status post CABG 3 Q000111Q, diastolic heart failure,diabetes, presented [facility] ED with generalized weakness, occasional dyspnea with exertion, several days in duration. [name], [name], has received packing and antibiotics and discharged. She has been week since.   Assessment & Plan:   Principal Problem:   Acute kidney injury ([place]) - appears to be pre renal in etiology from poor oral intake - IVF have been provided - Cr continues trending down - will monitor for additional 24 hours and repeat BMP in AM  Active Problems:   HYPERTENSION, BENIGN SYSTEMIC - reasonable inpatient control     Vitiligo - chronic, outpatient follow up    DM (diabetes mellitus), type 2, uncontrolled w/neurologic complication ([place]) - continue SSI for now until oral intake improves    CAD (coronary artery disease), native coronary artery - on aspirin and plavix - continue same regimen     Diastolic CHF ([place]) - chronic, no evidence of volume overload at this time - weight is 68 kg this AM    Anemia, iron deficiency - no signs of active bleeding - slight drop in Hg likely dilutional     Fall with subsequent maxillary fracture ([place]) - PT eval requested     Hypoxia - currently stable, no indication of PNA    DVT prophylaxis: SCD's Code Status: Full  Family Communication: Patient at bedside  Disposition Plan: Home in AM  Consultants:   None  Procedures:   None  Antimicrobials:   None   Subjective: Reports feeling better.   Objective: Filed Vitals:   07/09/15 1600 07/09/15 1700 07/09/15 1739 07/09/15  2125  BP: 125/68 128/65  125/49  Pulse: 75 76  71  Temp:    98.1 F (36.7 C)  TempSrc:    Oral  Resp: 18 18  18   Height:   5\' 2"  (1.575 m)   Weight:   68.153 kg (150 lb 4 oz)   SpO2: 98% 97%  98%    Intake/Output Summary (Last 24 hours) at 07/10/15 1232 Last data filed at 07/10/15 1143  Gross per 24 hour  Intake 919.25 ml  Output    300 ml  Net 619.25 ml   Filed Weights   07/09/15 1739  Weight: 68.153 kg (150 lb 4 oz)    Examination:  General exam: Appears calm and comfortable  Respiratory system: Clear to auscultation. Respiratory effort normal. Cardiovascular system: S1 & S2 heard, RRR. No JVD, murmurs, rubs, gallops or clicks. No pedal edema. Gastrointestinal system: Abdomen is nondistended, soft and nontender Central nervous system: Alert and oriented. No focal neurological deficits. Extremities: Symmetric 5 x 5 power. Skin: No rashes, lesions or ulcers Psychiatry: Judgement and insight appear normal. Mood & affect appropriate.    Data Reviewed: I have personally reviewed following labs and imaging studies  CBC:  Recent Labs Lab 07/09/15 0129 07/10/15 0507  WBC 7.8 6.1  HGB 9.3* 8.2*  HCT 27.5* 24.7*  MCV 89.3 90.1  PLT 341 [id number]   Basic Metabolic Panel:  Recent Labs Lab 07/09/15 0129 07/10/15 0507  NA 137 141  K 3.7 4.2  CL 106 111  CO2 20* 21*  GLUCOSE 210* 114*  BUN 13 12  CREATININE 1.49* 1.13*  CALCIUM 9.5 8.6*   Cardiac Enzymes:  Recent Labs Lab 07/09/15 0129 07/09/15 0437  TROPONINI 0.03 <0.03   CBG:  Recent Labs Lab 07/09/15 1303 07/09/15 1715 07/09/15 2128 07/10/15 0543 07/10/15 1121  GLUCAP 105* 98 120* 101* 129*   Lipid Profile:  Recent Labs  07/09/15 0437  CHOL 162  HDL 30*  LDLCALC 95  TRIG 183*  CHOLHDL 5.4   Thyroid Function Tests:  Recent Labs  07/09/15 0739  TSH 1.247   Anemia Panel:  Recent Labs  07/09/15 0734 07/09/15 0739  VITAMINB12 191  --   FOLATE  --  13.9  FERRITIN 11  --   TIBC  252  --   IRON 14*  --   RETICCTPCT 3.1  --    Urine analysis:    Component Value Date/Time   COLORURINE AMBER* 07/09/2015 0132   APPEARANCEUR CLOUDY* 07/09/2015 0132   LABSPEC 1.027 07/09/2015 0132   PHURINE 5.0 07/09/2015 0132   GLUCOSEU 100* 07/09/2015 0132   HGBUR NEGATIVE 07/09/2015 0132   BILIRUBINUR SMALL* 07/09/2015 0132   KETONESUR 15* 07/09/2015 0132   PROTEINUR 30* 07/09/2015 0132   UROBILINOGEN 0.2 09/16/2014 0009   NITRITE NEGATIVE 07/09/2015 0132   LEUKOCYTESUR SMALL* 07/09/2015 0132   Sepsis Labs: @LABRCNTIP (procalcitonin:4,lacticidven:4)  ) Recent Results (from the past 240 hour(s))  Culture, Urine     Status: None (Preliminary result)   Collection Time: 07/09/15  9:32 AM  Result Value Ref Range Status   Specimen Description URINE, CLEAN CATCH  Final   Special Requests NONE  Final   Culture TOO YOUNG TO READ  Final   Report Status PENDING  Incomplete      Radiology Studies: Dg Chest 2 View  07/09/2015  CLINICAL DATA:  Hypoxia. Dizziness. Nausea and vomiting. Cough. Symptoms for 4 days. EXAM: CHEST  2 VIEW COMPARISON:  09/09/2014 FINDINGS: Normal heart size. Subsegmental atelectasis of the left base. Postoperative changes. External objects project over the upper lung zones. IMPRESSION: Subsegmental atelectasis at the left base. Electronically Signed   By: Marybelle Killings M.D.   On: 07/09/2015 08:59      Scheduled Meds: . aspirin EC  81 mg Oral Daily  . atorvastatin  20 mg Oral QHS  . clopidogrel  75 mg Oral Daily  . docusate sodium  100 mg Oral Q12H  . famotidine  20 mg Oral QHS  . feeding supplement (ENSURE ENLIVE)  237 mL Oral TID BM  . ferrous sulfate  325 mg Oral Daily  . folic acid  1 mg Oral Daily  . gabapentin  100 mg Oral TID  . insulin aspart  0-15 Units Subcutaneous TID WC  . insulin aspart  0-5 Units Subcutaneous QHS  . metoCLOPramide  10 mg Oral TID AC & HS  . metoprolol tartrate  12.5 mg Oral BID  . midodrine  10 mg Oral TID WC  .  sertraline  75 mg Oral Daily  . sodium chloride flush  3 mL Intravenous Q12H   Continuous Infusions:   Time spent: 20 minutes    Maria Ramsay, MD [facility] Pager [phone]  If 7PM-7AM, please contact night-coverage [email or web address] Password [facility] 07/10/2015, 12:32 PM

## 2015-07-10 NOTE — Care Management Obs Status (Signed)
[place] NOTIFICATION   Patient Details  Name: Maria Fox MRN: [id number] Date of Birth: 11-28-1945   Medicare Observation Status Notification Given:  Yes    Dawayne Patricia, RN 07/10/2015, 11:21 AM

## 2015-07-11 DIAGNOSIS — I5032 Chronic diastolic (congestive) heart failure: Secondary | ICD-10-CM

## 2015-07-11 DIAGNOSIS — N179 Acute kidney failure, unspecified: Secondary | ICD-10-CM | POA: Diagnosis not present

## 2015-07-11 DIAGNOSIS — D509 Iron deficiency anemia, unspecified: Secondary | ICD-10-CM | POA: Diagnosis not present

## 2015-07-11 LAB — BASIC METABOLIC PANEL
ANION GAP: 7 (ref 5–15)
BUN: 12 mg/dL (ref 6–20)
CO2: 23 mmol/L (ref 22–32)
Calcium: 8.8 mg/dL — ABNORMAL LOW (ref 8.9–10.3)
Chloride: 108 mmol/L (ref 101–111)
Creatinine, Ser: 1.22 mg/dL — ABNORMAL HIGH (ref 0.44–1.00)
GFR calc non Af Amer: 43 mL/min — ABNORMAL LOW (ref >60)
GFR, EST AFRICAN AMERICAN: 50 mL/min — AB (ref >60)
GLUCOSE: 135 mg/dL — AB (ref 65–99)
POTASSIUM: 4.1 mmol/L (ref 3.5–5.1)
SODIUM: 138 mmol/L (ref 135–145)

## 2015-07-11 LAB — GLUCOSE, CAPILLARY
Glucose-Capillary: 150 mg/dL — ABNORMAL HIGH (ref 65–99)
Glucose-Capillary: 143 mg/dL — ABNORMAL HIGH (ref 65–99)
Glucose-Capillary: 232 mg/dL — ABNORMAL HIGH (ref 65–99)
Glucose-Capillary: 77 mg/dL (ref 65–99)
Glucose-Capillary: 94 mg/dL (ref 65–99)

## 2015-07-11 LAB — CBC
HCT: 25.3 % — ABNORMAL LOW (ref 36.0–46.0)
Hemoglobin: 8.4 g/dL — ABNORMAL LOW (ref 12.0–15.0)
MCH: 29.1 pg (ref 26.0–34.0)
MCHC: 33.2 g/dL (ref 30.0–36.0)
MCV: 87.5 fL (ref 78.0–100.0)
PLATELETS: 303 10*3/uL (ref 150–400)
RBC: 2.89 MIL/uL — AB (ref 3.87–5.11)
RDW: 13.6 % (ref 11.5–15.5)
WBC: 6.7 10*3/uL (ref 4.0–10.5)

## 2015-07-11 LAB — URINE CULTURE

## 2015-07-11 MED ORDER — HYDROCODONE-ACETAMINOPHEN 5-325 MG PO TABS: 1.0000 | ORAL_TABLET | ORAL | Status: DC | PRN

## 2015-07-11 NOTE — Discharge Summary (Signed)
Physician Discharge Summary  Maria Fox [id number] DOB: [birth year] DOA: 07/09/2015  PCP: [name], MD  Admit date: 07/09/2015 Discharge date: 07/11/2015  Recommendations for Outpatient Follow-up:  1. Pt will need to follow up with PCP in 1 week post discharge 2. Please obtain BMP to evaluate electrolytes and kidney function 3. Please also check CBC to evaluate Hg and Hct levels  Discharge Diagnoses:  Principal Problem:   Acute kidney injury (Maria Fox)   Orthostatic hypotension   Discharge Condition: Stable  Diet recommendation: Heart healthy diet discussed in details   Brief Narrative:  70 y.o. female with CAD, status post CABG 3 Q000111Q, diastolic heart failure,diabetes, presented to [facility] ED with generalized weakness, occasional dyspnea with exertion, several days in duration. [name], [name], has received packing and antibiotics and discharged. She has been week since.   Assessment & Plan:  Principal Problem:  Acute kidney injury (Maria Fox) - appears to be pre renal in etiology from poor oral intake - IVF have been provided - Cr continues trending down but leveling off at ~ 1.2 - repeat again in AM prior to discharge   Active Problems:   Orthostatic hypotension  - suspected to be secondary to underlying DM - advised to continue to ensure hydration, slow changes with changes position    HYPERTENSION, BENIGN SYSTEMIC - reasonable inpatient control    Vitiligo - chronic, outpatient follow up   DM (diabetes mellitus), type 2, uncontrolled w/neurologic complication (Maria Fox) - continue home medical regimen    CAD (coronary artery disease), native coronary artery - on aspirin and plavix - continue same regimen    Diastolic CHF ([place]) - chronic, no evidence of volume overload at this time - weight is 68 kg this AM   Anemia, iron  deficiency - no signs of active bleeding   Fall with subsequent maxillary fracture ([place] - PT eval done, no further recommendations    Hypoxia - currently stable, no indication of PNA   DVT prophylaxis: SCD's Code Status: Full  Family Communication: Patient at bedside  Disposition Plan: Home in AM  Consultants:   None  Procedures:   None  Antimicrobials:  None  Discharge Exam: Filed Vitals:   07/10/15 2203 07/11/15 0543  BP: 125/64 130/61  Pulse: 81 74  Temp: 98.2 F (36.8 C) 98.1 F (36.7 C)  Resp: 18 18   Filed Vitals:   07/09/15 2125 07/10/15 1334 07/10/15 2203 07/11/15 0543  BP: 125/49 131/52 125/64 130/61  Pulse: 71 67 81 74  Temp: 98.1 F (36.7 C) 98.2 F (36.8 C) 98.2 F (36.8 C) 98.1 F (36.7 C)  TempSrc: Oral Oral Oral Oral  Resp: 18 18 18 18   Height:      Weight:      SpO2: 98% 96% 98%     General: Pt is alert, follows commands appropriately, not in acute distress Cardiovascular: Regular rate and rhythm, no rubs, no gallops Respiratory: Clear to auscultation bilaterally, no wheezing, no crackles, no rhonchi Abdominal: Soft, non tender, non distended, bowel sounds +, no guarding Extremities: no edema, no cyanosis, pulses palpable bilaterally DP and PT  Discharge Instructions  Discharge Instructions    Diet - low sodium heart healthy    Complete by:  As directed      Increase activity slowly    Complete by:  As directed  Medication List    STOP taking these medications        cephALEXin 500 MG capsule  Commonly known as:  KEFLEX      TAKE these medications        aspirin EC 81 MG tablet  Take 81 mg by mouth daily.     atorvastatin 20 MG tablet  Commonly known as:  LIPITOR  Take 1 tablet (20 mg total) by mouth at bedtime.     clopidogrel 75 MG tablet  Commonly known as:  PLAVIX  Take 1 tablet (75 mg total) by mouth daily.     docusate sodium 100 MG capsule  Commonly known as:  COLACE  Take 1 capsule (100  mg total) by mouth every 12 (twelve) hours.     famotidine 20 MG tablet  Commonly known as:  PEPCID  Take 1 tablet (20 mg total) by mouth at bedtime.     feeding supplement (GLUCERNA SHAKE) Liqd  Take 237 mLs by mouth 3 (three) times daily between meals.     ferrous sulfate 325 (65 FE) MG tablet  Take 1 tablet (325 mg total) by mouth daily.     folic acid 1 MG tablet  Commonly known as:  FOLVITE  Take 1 tablet (1 mg total) by mouth daily.     gabapentin 100 MG capsule  Commonly known as:  NEURONTIN  TAKE ONE CAPSULE BY MOUTH THREE TIMES DAILY     HYDROcodone-acetaminophen 5-325 MG tablet  Commonly known as:  NORCO  Take 1-2 tablets by mouth every 4 (four) hours as needed.     hydrocortisone cream 1 %  Apply 1 application topically 2 (two) times daily.     hydrOXYzine 10 MG tablet  Commonly known as:  ATARAX/VISTARIL  Take 1 tablet (10 mg total) by mouth 3 (three) times daily as needed for itching.     meclizine 25 MG tablet  Commonly known as:  ANTIVERT  TAKE ONE TABLET BY MOUTH THREE TIMES DAILY AS NEEDED FOR DIZZINESS     metFORMIN 500 MG tablet  Commonly known as:  GLUCOPHAGE  TAKE ONE TABLET BY MOUTH TWICE DAILY WITH  A  MEAL     metoCLOPramide 10 MG tablet  Commonly known as:  REGLAN  Take 1 tablet (10 mg total) by mouth 4 (four) times daily -  before meals and at bedtime.     metoprolol tartrate 25 MG tablet  Commonly known as:  LOPRESSOR  Take 0.5 tablets (12.5 mg total) by mouth 2 (two) times daily.     midodrine 10 MG tablet  Commonly known as:  PROAMATINE  Take 1 tablet (10 mg total) by mouth 3 (three) times daily with meals.     ondansetron 4 MG disintegrating tablet  Commonly known as:  ZOFRAN ODT  4mg  ODT q8 hours prn nausea/vomit     sertraline 50 MG tablet  Commonly known as:  ZOLOFT  Take 1.5 tablets (75 mg total) by mouth daily.     TRADJENTA 5 MG Tabs tablet  Generic drug:  linagliptin  TAKE ONE TABLET BY MOUTH DAILY     traMADol 50 MG  tablet  Commonly known as:  ULTRAM  TAKE ONE TABLET BY MOUTH EVERY 6 HOURS AS NEEDED     Vitamin D (Ergocalciferol) 50000 units Caps capsule  Commonly known as:  DRISDOL  TAKE ONE CAPSULE BY MOUTH EVERY 7 DAYS.           Follow-up Information  Follow up with [name], MD.   Specialty:  [facility] Medicine   Contact information:   [address] [place] [address] [phone]       Call Faye Ramsay, MD.   Specialty:  Internal Medicine   Why:  As needed call my cell phone [phone]   Contact information:   [address] [place] [address] [phone]        The results of significant diagnostics from this hospitalization (including imaging, microbiology, ancillary and laboratory) are listed below for reference.     Microbiology: Recent Results (from the past 240 hour(s))  Culture, Urine     Status: None (Preliminary result)   Collection Time: 07/09/15  9:32 AM  Result Value Ref Range Status   Specimen Description URINE, CLEAN CATCH  Final   Special Requests NONE  Final   Culture TOO YOUNG TO READ  Final   Report Status PENDING  Incomplete     Labs: Basic Metabolic Panel:  Recent Labs Lab 07/09/15 0129 07/10/15 0507 07/11/15 0401  NA 137 141 138  K 3.7 4.2 4.1  CL 106 111 108  CO2 20* 21* 23  GLUCOSE 210* 114* 135*  BUN 13 12 12   CREATININE 1.49* 1.13* 1.22*  CALCIUM 9.5 8.6* 8.8*   CBC:  Recent Labs Lab 07/09/15 0129 07/10/15 0507 07/11/15 0401  WBC 7.8 6.1 6.7  HGB 9.3* 8.2* 8.4*  HCT 27.5* 24.7* 25.3*  MCV 89.3 90.1 87.5  PLT 341 299 303   Cardiac Enzymes:  Recent Labs Lab 07/09/15 0129 07/09/15 0437  TROPONINI 0.03 <0.03   BNP: BNP (last 3 results)  Recent Labs  08/07/14 1344 09/09/14 1315  BNP 297.8* 118.6*   CBG:  Recent Labs Lab 07/10/15 0543 07/10/15 1121 07/10/15 1619 07/10/15 2201 07/11/15 0617  GLUCAP 101* 129* 118* 150* 143*     SIGNED: Time  coordinating discharge: 30 minutes  MAGICK-Dvon Jiles, MD  [facility] 07/11/2015, 11:09 AM Pager [phone]  If 7PM-7AM, please contact night-coverage [email or web address] Password [email or web address]

## 2015-07-11 NOTE — Progress Notes (Addendum)
Physical Therapy Treatment Patient Details Name: Maria Fox MRN: [id number] DOB: May 13, 1945 Today's Date: 07/11/2015    History of Present Illness Pt adm with acute kidney injury and borderline orthostatic hypotension. PMH - orthostatic hypotension, HTN, DM, CVA, CABG, PNA    PT Comments    Patient continues to be limited by orthostatic signs and symptoms.  Feel HHPT indicated to progress mobility, strength and safety in the home.  Reports will have assist as needed and has RW at home. Encouraged to use walker for fall prevention and to follow orthostatic precautions.  Follow Up Recommendations  Supervision for mobility/OOB;Home health PT     Equipment Recommendations  None recommended by PT    Recommendations for Other Services       Precautions / Restrictions Precautions Precautions: Fall Precaution Comments: watch BP's    Mobility  Bed Mobility Overal bed mobility: Modified Independent             General bed mobility comments: Incr time  Transfers Overall transfer level: Needs assistance Equipment used: 1 person hand held assist Transfers: Sit to/from Stand Sit to Stand: Min guard            Ambulation/Gait Ambulation/Gait assistance: Min guard Ambulation Distance (Feet): 90 Feet Assistive device: 1 person hand held assist Gait Pattern/deviations: Step-through pattern;Decreased stride length;Narrow base of support     General Gait Details: increased unsteadiness with ambulation and some degree of orthostasis   Stairs            Wheelchair Mobility    Modified Rankin (Stroke Patients Only)       Balance Overall balance assessment: Needs assistance Sitting-balance support: Feet supported Sitting balance-Leahy Scale: Good     Standing balance support: No upper extremity supported Standing balance-Leahy Scale: Fair                      Cognition Arousal/Alertness: Awake/alert Behavior During Therapy: Anxious Overall  Cognitive Status: Within Functional Limits for tasks assessed                      Exercises      General Comments General comments (skin integrity, edema, etc.): famiiy in room and talkative about orthostataic symptoms; reviewed precautions with patient      Pertinent Vitals/Pain Pain Assessment: No/denies pain  Orthostatic VS for the past 24 hrs:  BP- Lying Pulse- Lying BP- Sitting Pulse- Sitting BP- Standing at 0 minutes Pulse- Standing at 0 minutes  07/11/15 1603 134/56 mmHg 67 107/60 mmHg 78 118/46 mmHg 83  After ambulation 97/58, HR 75      Home Living                      Prior Function            PT Goals (current goals can now be found in the care plan section) Progress towards PT goals: Progressing toward goals    Frequency  Min 3X/week    PT Plan Discharge plan needs to be updated    Co-evaluation             End of Session Equipment Utilized During Treatment: Gait belt Activity Tolerance: Patient limited by fatigue Patient left: in bed;with call bell/phone within reach;with family/visitor present     Time: 1515-1540 PT Time Calculation (min) (ACUTE ONLY): 25 min  Charges:  $Gait Training: 8-22 mins $Self Care/Home Management: 8-22  G CodesReginia Naas 07/11/2015, 4:09 PM  Magda Kiel, [place] 07/11/2015

## 2015-07-11 NOTE — Progress Notes (Signed)
Informed the patient that she is being d/c'ed. Stated had not seen a doctor today, she and her husband are requesting to see her doctor before d/c, that they have multiple questions. Tx paged Dr. Doyle Askew requesting to visit patient prior to d/c - that they have multiple questions.

## 2015-07-12 ENCOUNTER — Observation Stay (HOSPITAL_COMMUNITY): Payer: Medicare Other

## 2015-07-12 DIAGNOSIS — N179 Acute kidney failure, unspecified: Secondary | ICD-10-CM | POA: Diagnosis not present

## 2015-07-12 DIAGNOSIS — I639 Cerebral infarction, unspecified: Secondary | ICD-10-CM | POA: Diagnosis not present

## 2015-07-12 DIAGNOSIS — I2510 Atherosclerotic heart disease of native coronary artery without angina pectoris: Secondary | ICD-10-CM | POA: Diagnosis not present

## 2015-07-12 DIAGNOSIS — W19XXXA Unspecified fall, initial encounter: Secondary | ICD-10-CM | POA: Diagnosis not present

## 2015-07-12 DIAGNOSIS — I10 Essential (primary) hypertension: Secondary | ICD-10-CM | POA: Diagnosis not present

## 2015-07-12 LAB — GLUCOSE, CAPILLARY
GLUCOSE-CAPILLARY: 246 mg/dL — AB (ref 65–99)
Glucose-Capillary: 103 mg/dL — ABNORMAL HIGH (ref 65–99)
Glucose-Capillary: 86 mg/dL (ref 65–99)
Glucose-Capillary: 113 mg/dL — ABNORMAL HIGH (ref 65–99)

## 2015-07-12 LAB — BASIC METABOLIC PANEL
Anion gap: 10 (ref 5–15)
BUN: 14 mg/dL (ref 6–20)
CHLORIDE: 107 mmol/L (ref 101–111)
CO2: 21 mmol/L — ABNORMAL LOW (ref 22–32)
CREATININE: 1.18 mg/dL — AB (ref 0.44–1.00)
Calcium: 8.9 mg/dL (ref 8.9–10.3)
GFR calc Af Amer: 52 mL/min — ABNORMAL LOW (ref >60)
GFR, EST NON AFRICAN AMERICAN: 45 mL/min — AB (ref >60)
GLUCOSE: 92 mg/dL (ref 65–99)
POTASSIUM: 3.9 mmol/L (ref 3.5–5.1)
Sodium: 138 mmol/L (ref 135–145)

## 2015-07-12 LAB — CBC
HCT: 27.9 % — ABNORMAL LOW (ref 36.0–46.0)
Hemoglobin: 9.2 g/dL — ABNORMAL LOW (ref 12.0–15.0)
MCH: 29.6 pg (ref 26.0–34.0)
MCHC: 33 g/dL (ref 30.0–36.0)
MCV: 89.7 fL (ref 78.0–100.0)
PLATELETS: 367 10*3/uL (ref 150–400)
RBC: 3.11 MIL/uL — AB (ref 3.87–5.11)
RDW: 14.1 % (ref 11.5–15.5)
WBC: 8 10*3/uL (ref 4.0–10.5)

## 2015-07-12 LAB — CORTISOL: Cortisol, Plasma: 7.2 ug/dL

## 2015-07-12 NOTE — Progress Notes (Signed)
NP called by radiologist because of + MRI brain. + left PCA infarct-awaiting final report. Pt is already on ASA and Plavix for a hx of CAD. NP spoke with Dr. Nicole Kindred of neuro who stated there was no further action/change in tx plan needed tonight.  KJKG, NP [facility]

## 2015-07-12 NOTE — Progress Notes (Signed)
Patient ID: Maria Fox, female   DOB: 08-19-1945, 70 y.o.   MRN: [id number]    PROGRESS NOTE    Maria Fox  [name] DOB: 02/04/1946 DOA: 07/09/2015  PCP: [name], MD   Outpatient Specialists:   Brief Narrative:  70 y.o. female with CAD, status post CABG 3 Q000111Q, diastolic heart failure,diabetes, presented to [facility] ED with generalized weakness, occasional dyspnea with exertion, several days in duration. [name], [name], has received packing and antibiotics and discharged. She has been week since.   Hospital stay complicated by unsteady gait adn intermittent LE weakness, MRI brain requested to rule out stroke.   Assessment & Plan:   Principal Problem:   Acute kidney injury ([place]) - appears to be pre renal in etiology from poor oral intake - IVF have been provided - Cr continues trending down - encouraged oral intake, repeat BMP in AM  Active Problems:   Falls - appears to be secondary to orthostatic hypotension - this is likely related to autonomic dysfunction in the setting of long standing diabetes - no acute findings on CT head, MRI brain still pending     HYPERTENSION, BENIGN SYSTEMIC - reasonable inpatient control  - pt on metoprolol only     Vitiligo - chronic, outpatient follow up    DM (diabetes mellitus), type 2, uncontrolled w/neurologic complication and gastroparesis ([name]) - continue SSI for now until oral intake improves - also continue Metoclopramide     CAD (coronary artery disease), native coronary artery - on aspirin and plavix - continue same regimen     Diastolic CHF ([name]) - chronic, no evidence of volume overload at this time - weight is 68 kg     Anemia, iron deficiency - no signs of active bleeding - slight drop in Hg likely dilutional     Hypoxia - currently stable, no indication of PNA    DVT  prophylaxis: SCD's Code Status: Full  Family Communication: Patient and husband at bedside  Disposition Plan: Home by 5/4  Consultants:   None  Procedures:   None  Antimicrobials:   None   Subjective: Reports feeling weak, cold and clammy, LE weakness worse this AM.  Objective: Filed Vitals:   07/11/15 1331 07/11/15 2237 07/12/15 0311 07/12/15 1019  BP: 126/49 129/66 135/66 120/58  Pulse: 67 75 61 63  Temp: 98.2 F (36.8 C) 98.2 F (36.8 C) 98.2 F (36.8 C)   TempSrc: Oral Oral Oral   Resp: 18 18 18    Height:      Weight:      SpO2: 96% 98% 97%     Intake/Output Summary (Last 24 hours) at 07/12/15 1134 Last data filed at 07/11/15 2239  Gross per 24 hour  Intake    840 ml  Output    650 ml  Net    190 ml   Filed Weights   07/09/15 1739  Weight: 68.153 kg (150 lb 4 oz)    Examination:  General exam: Appears calm and comfortable  Respiratory system: Clear to auscultation. Respiratory effort normal. Cardiovascular system: S1 & S2 heard, RRR. No JVD, murmurs, rubs, gallops or clicks. No pedal edema. Gastrointestinal system: Abdomen is nondistended, soft and nontender Central nervous system: Alert and oriented. No focal neurological deficits. Extremities: Symmetric 5 x 5 power. Lack of effort on exam noted, pt says she is too weak to  participate.  Skin: diffuse vitiligo  Psychiatry: Judgement and insight appear normal. Mood & affect appropriate.   Data Reviewed: I have personally reviewed following labs and imaging studies  CBC:  Recent Labs Lab 07/09/15 0129 07/10/15 0507 07/11/15 0401 07/12/15 0255  WBC 7.8 6.1 6.7 8.0  HGB 9.3* 8.2* 8.4* 9.2*  HCT 27.5* 24.7* 25.3* 27.9*  MCV 89.3 90.1 87.5 89.7  PLT 341 299 303 [id number]   Basic Metabolic Panel:  Recent Labs Lab 07/09/15 0129 07/10/15 0507 07/11/15 0401 07/12/15 0255  NA 137 141 138 138  K 3.7 4.2 4.1 3.9  CL 106 111 108 107  CO2 20* 21* 23 21*  GLUCOSE 210* 114* 135* 92  BUN 13 12 12  14   CREATININE 1.49* 1.13* 1.22* 1.18*  CALCIUM 9.5 8.6* 8.8* 8.9   Cardiac Enzymes:  Recent Labs Lab 07/09/15 0129 07/09/15 0437  TROPONINI 0.03 <0.03   CBG:  Recent Labs Lab 07/11/15 1130 07/11/15 1656 07/11/15 2233 07/12/15 0548 07/12/15 1110  GLUCAP 232* 77 94 103* 246*   Urine analysis:    Component Value Date/Time   COLORURINE AMBER* 07/09/2015 0132   APPEARANCEUR CLOUDY* 07/09/2015 0132   LABSPEC 1.027 07/09/2015 0132   PHURINE 5.0 07/09/2015 0132   GLUCOSEU 100* 07/09/2015 0132   HGBUR NEGATIVE 07/09/2015 0132   BILIRUBINUR SMALL* 07/09/2015 0132   KETONESUR 15* 07/09/2015 0132   PROTEINUR 30* 07/09/2015 0132   UROBILINOGEN 0.2 09/16/2014 0009   NITRITE NEGATIVE 07/09/2015 0132   LEUKOCYTESUR SMALL* 07/09/2015 0132    Recent Results (from the past 240 hour(s))  Culture, Urine     Status: Abnormal   Collection Time: 07/09/15  9:32 AM  Result Value Ref Range Status   Specimen Description URINE, CLEAN CATCH  Final   Special Requests NONE  Final   Culture MULTIPLE SPECIES PRESENT, SUGGEST RECOLLECTION (A)  Final   Report Status 07/11/2015 FINAL  Final      Radiology Studies: No results found.    Scheduled Meds: . aspirin EC  81 mg Oral Daily  . atorvastatin  20 mg Oral QHS  . clopidogrel  75 mg Oral Daily  . docusate sodium  100 mg Oral Q12H  . famotidine  20 mg Oral QHS  . feeding supplement (ENSURE ENLIVE)  237 mL Oral TID BM  . ferrous sulfate  325 mg Oral Daily  . folic acid  1 mg Oral Daily  . gabapentin  100 mg Oral TID  . insulin aspart  0-15 Units Subcutaneous TID WC  . insulin aspart  0-5 Units Subcutaneous QHS  . metoCLOPramide  10 mg Oral TID AC & HS  . metoprolol tartrate  12.5 mg Oral BID  . midodrine  10 mg Oral TID WC  . sertraline  75 mg Oral Daily  . sodium chloride flush  3 mL Intravenous Q12H   Continuous Infusions:   Time spent: 20 minutes    Faye Ramsay, MD [facility] Pager [phone]  If  7PM-7AM, please contact night-coverage [email or web address] Password [email or web address] 07/12/2015, 11:34 AM

## 2015-07-12 NOTE — Progress Notes (Addendum)
Chart reviewed for UR - per Physician advisor- DR. Reynaldo Minium- pt no longer meets for continued Observation stay- advised to give pt ABN (Advance Beneficiary Notice of Non-coverage) - ABN given to pt and explained options- pt signed and copy placed in shadow chart with original given to pt. MRI pending- pt voices understanding that if MRI negative - she will be ready for discharge.

## 2015-07-13 ENCOUNTER — Inpatient Hospital Stay (HOSPITAL_COMMUNITY): Payer: Medicare Other

## 2015-07-13 DIAGNOSIS — I951 Orthostatic hypotension: Secondary | ICD-10-CM | POA: Diagnosis present

## 2015-07-13 DIAGNOSIS — I10 Essential (primary) hypertension: Secondary | ICD-10-CM | POA: Diagnosis not present

## 2015-07-13 DIAGNOSIS — E785 Hyperlipidemia, unspecified: Secondary | ICD-10-CM | POA: Diagnosis present

## 2015-07-13 DIAGNOSIS — K3184 Gastroparesis: Secondary | ICD-10-CM | POA: Diagnosis present

## 2015-07-13 DIAGNOSIS — R296 Repeated falls: Secondary | ICD-10-CM | POA: Diagnosis present

## 2015-07-13 DIAGNOSIS — I2510 Atherosclerotic heart disease of native coronary artery without angina pectoris: Secondary | ICD-10-CM | POA: Diagnosis not present

## 2015-07-13 DIAGNOSIS — N179 Acute kidney failure, unspecified: Secondary | ICD-10-CM | POA: Diagnosis not present

## 2015-07-13 DIAGNOSIS — I63532 Cerebral infarction due to unspecified occlusion or stenosis of left posterior cerebral artery: Secondary | ICD-10-CM | POA: Clinically undetermined

## 2015-07-13 DIAGNOSIS — Z951 Presence of aortocoronary bypass graft: Secondary | ICD-10-CM | POA: Diagnosis not present

## 2015-07-13 DIAGNOSIS — W19XXXA Unspecified fall, initial encounter: Secondary | ICD-10-CM | POA: Diagnosis not present

## 2015-07-13 DIAGNOSIS — L80 Vitiligo: Secondary | ICD-10-CM | POA: Diagnosis present

## 2015-07-13 DIAGNOSIS — R531 Weakness: Secondary | ICD-10-CM

## 2015-07-13 DIAGNOSIS — E1165 Type 2 diabetes mellitus with hyperglycemia: Secondary | ICD-10-CM | POA: Diagnosis not present

## 2015-07-13 DIAGNOSIS — Z7982 Long term (current) use of aspirin: Secondary | ICD-10-CM | POA: Diagnosis not present

## 2015-07-13 DIAGNOSIS — R0902 Hypoxemia: Secondary | ICD-10-CM | POA: Diagnosis present

## 2015-07-13 DIAGNOSIS — E1143 Type 2 diabetes mellitus with diabetic autonomic (poly)neuropathy: Secondary | ICD-10-CM | POA: Diagnosis present

## 2015-07-13 DIAGNOSIS — Z79899 Other long term (current) drug therapy: Secondary | ICD-10-CM | POA: Diagnosis not present

## 2015-07-13 DIAGNOSIS — E860 Dehydration: Secondary | ICD-10-CM | POA: Diagnosis present

## 2015-07-13 DIAGNOSIS — E1140 Type 2 diabetes mellitus with diabetic neuropathy, unspecified: Secondary | ICD-10-CM | POA: Diagnosis not present

## 2015-07-13 DIAGNOSIS — Z8673 Personal history of transient ischemic attack (TIA), and cerebral infarction without residual deficits: Secondary | ICD-10-CM | POA: Diagnosis not present

## 2015-07-13 DIAGNOSIS — Z7902 Long term (current) use of antithrombotics/antiplatelets: Secondary | ICD-10-CM | POA: Diagnosis not present

## 2015-07-13 DIAGNOSIS — Z7984 Long term (current) use of oral hypoglycemic drugs: Secondary | ICD-10-CM | POA: Diagnosis not present

## 2015-07-13 DIAGNOSIS — I340 Nonrheumatic mitral (valve) insufficiency: Secondary | ICD-10-CM | POA: Diagnosis not present

## 2015-07-13 DIAGNOSIS — I639 Cerebral infarction, unspecified: Secondary | ICD-10-CM | POA: Diagnosis not present

## 2015-07-13 DIAGNOSIS — I252 Old myocardial infarction: Secondary | ICD-10-CM | POA: Diagnosis not present

## 2015-07-13 DIAGNOSIS — I5032 Chronic diastolic (congestive) heart failure: Secondary | ICD-10-CM | POA: Diagnosis present

## 2015-07-13 DIAGNOSIS — I6789 Other cerebrovascular disease: Secondary | ICD-10-CM | POA: Diagnosis not present

## 2015-07-13 DIAGNOSIS — D509 Iron deficiency anemia, unspecified: Secondary | ICD-10-CM | POA: Diagnosis not present

## 2015-07-13 DIAGNOSIS — I110 Hypertensive heart disease with heart failure: Secondary | ICD-10-CM | POA: Diagnosis present

## 2015-07-13 DIAGNOSIS — H53461 Homonymous bilateral field defects, right side: Secondary | ICD-10-CM | POA: Diagnosis not present

## 2015-07-13 HISTORY — DX: Cerebral infarction due to unspecified occlusion or stenosis of left posterior cerebral artery: I63.532

## 2015-07-13 LAB — GLUCOSE, CAPILLARY
Glucose-Capillary: 101 mg/dL — ABNORMAL HIGH (ref 65–99)
Glucose-Capillary: 191 mg/dL — ABNORMAL HIGH (ref 65–99)
Glucose-Capillary: 146 mg/dL — ABNORMAL HIGH (ref 65–99)
Glucose-Capillary: 229 mg/dL — ABNORMAL HIGH (ref 65–99)

## 2015-07-13 LAB — CBC
HCT: 28 % — ABNORMAL LOW (ref 36.0–46.0)
Hemoglobin: 9.3 g/dL — ABNORMAL LOW (ref 12.0–15.0)
MCH: 29.9 pg (ref 26.0–34.0)
MCHC: 33.2 g/dL (ref 30.0–36.0)
MCV: 90 fL (ref 78.0–100.0)
Platelets: 379 10*3/uL (ref 150–400)
RBC: 3.11 MIL/uL — ABNORMAL LOW (ref 3.87–5.11)
RDW: 14.2 % (ref 11.5–15.5)
WBC: 7.5 10*3/uL (ref 4.0–10.5)

## 2015-07-13 LAB — BASIC METABOLIC PANEL
Anion gap: 10 (ref 5–15)
BUN: 15 mg/dL (ref 6–20)
CHLORIDE: 106 mmol/L (ref 101–111)
CO2: 22 mmol/L (ref 22–32)
Calcium: 8.7 mg/dL — ABNORMAL LOW (ref 8.9–10.3)
Creatinine, Ser: 1.31 mg/dL — ABNORMAL HIGH (ref 0.44–1.00)
GFR calc Af Amer: 46 mL/min — ABNORMAL LOW (ref >60)
GFR calc non Af Amer: 40 mL/min — ABNORMAL LOW (ref >60)
GLUCOSE: 137 mg/dL — AB (ref 65–99)
POTASSIUM: 3.9 mmol/L (ref 3.5–5.1)
Sodium: 138 mmol/L (ref 135–145)

## 2015-07-13 MED ORDER — STROKE: EARLY STAGES OF RECOVERY BOOK
Freq: Once | Status: AC
  Administered 2015-07-13: 1
  Filled 2015-07-13: qty 1

## 2015-07-13 NOTE — Progress Notes (Addendum)
Called to perform NIHSS, also note order for RN Swallow Screen (can't see this has been performed.)  Pt. Passed RN Swallow Screen and results documented.   NIHSS performed and documented and patient scored 3. Did give 1 point for aphasia for use of inappropriate words although this is questionable due to the patient not having her glasses.   Notified Lauren, RN of results of NIH and RN Swallow Screen. Also notified that patient needs NIH performed on discharge and to call [facility] staff when needed.   Anderson Malta, RN, [place]

## 2015-07-13 NOTE — Progress Notes (Signed)
RN spoke with Bed placement regarding pt transfer to [facility]M. Bed placement informed RN no bed available at this time.  RN called [facility]. Awaiting call back from charge nurse.   Pt unable to be transferred at this time. Will continue to monitor pt.  Arnell Sieving, RN

## 2015-07-13 NOTE — Progress Notes (Signed)
Patient ID: Maria Fox, female   DOB: 08-18-1945, 70 y.o.   MRN: [id number]    PROGRESS NOTE    Maria Fox  [name] DOB: 11/05/1945 DOA: 07/09/2015  PCP: [name], MD   Outpatient Specialists:   Brief Narrative:  70 y.o. female with CAD, status post CABG 3 Q000111Q, diastolic heart failure,diabetes, presented to [facility] ED with generalized weakness, occasional dyspnea with exertion, several days in duration. [name], [name], has received packing and antibiotics and discharged. She has been week since.   Hospital stay complicated by unsteady gait and intermittent LE weakness, MRI brain requested and was notable for L PCA territory stroke, with remote lacunar strokes raising concern of embolic stroke.   Assessment & Plan:   Principal Problem:   Falls with LE weakness / Orthostatic hypotension / new L PCA territory stroke and remote lacunar strokes - Stroke: Acute/subacute left PCA territory infarct  Remote lacunar infarcts left lentiform nucleus and corona radiata.  - Resultant - LE weakness with orthostatic hypotension, fall  - CT head 4/23 (on last admission) - No acute intracranial abnormalities, atrophy and small vessel ischemic changes. - MRI5/3 - Acute/subacute left PCA territory infarct  Remote lacunar infarcts left lentiform nucleus and corona radiata.  - Carotid Doppler pending  - 2D Echo pending  - LDL 4/30 - 95 - HgbA1c 4/30 - 4.9 - VTE prophylaxis - SCD's - Diet - regular diet (libearlized as pt with poor oral intake)  - Antithrombotic - on Aspirin 81 mg PO QD and Plavix 75 mg PO QD at home prior to this admission - Patient counseled to be compliant with his antithrombotic medications - Ongoing aggressive stroke risk factor management - Therapy recommendations:PT - HHPT with supervision, SLP and OT pending  - Disposition: pending  once stroke team evaluates, stroke order set in place     Acute kidney injury ([place]) - appears to be pre renal in etiology from poor oral intake - IVF have been provided but Cr remains 1.2 - 1.3 - diet liberalized in en effort to improve oral intake  - BMP in AM  Active Problems:   Falls - appears to be secondary to orthostatic hypotension - this is likely related to autonomic dysfunction in the setting of long standing diabetes and now new stroke  - HH PT recommended, orders placed     HYPERTENSION, BENIGN SYSTEMIC - reasonable inpatient control  - pt on metoprolol only     Vitiligo - chronic, outpatient follow up    DM (diabetes mellitus), type 2, uncontrolled w/neurologic complication and gastroparesis, autonomic dysfunction ([place]) - continue SSI for now until oral intake improves - also continue Metoclopramide  - last A1C 4/30 - 4.9 and there fore no need for repeat A1C     CAD (coronary artery disease), native coronary artery - on aspirin and plavix - follow up on neurology team recommendations     Diastolic CHF ([place]) - chronic, no evidence of volume overload at this time - weight is 68 kg     Anemia, iron deficiency - no signs of active bleeding - slight drop in Hg likely dilutional     Hypoxia - remains stable with oxygen saturation at target range   DVT prophylaxis: SCD's Code Status: Full  Family Communication: Patient and husband at bedside  Disposition Plan: Home when cleared by neurology team  Consultants:   Neurology  PT/OT/SLP  Procedures:   ECHO 5/4  Vascular carotid doppler 5/4  Antimicrobials:   None   Subjective: Reports feeling weak, cold and clammy, LE weakness still present   Objective: Filed Vitals:   07/12/15 1517 07/12/15 2101 07/13/15 0706 07/13/15 1000  BP: 137/56 123/53 116/50 126/53  Pulse: 60 65 64 63  Temp: 98.7 F (37.1 C) 98 F (36.7 C) 97.5 F (36.4 C)   TempSrc: Oral Oral Oral   Resp: 18 16 16    Height:       Weight:      SpO2: 98% 97% 97%    No intake or output data in the 24 hours ending 07/13/15 1227 Filed Weights   07/09/15 1739  Weight: 68.153 kg (150 lb 4 oz)    Examination:  General exam: Appears calm and comfortable  Respiratory system: Clear to auscultation. Respiratory effort normal. Cardiovascular system: S1 & S2 heard, RRR. No JVD, murmurs, rubs, gallops or clicks. No pedal edema. Gastrointestinal system: Abdomen is nondistended, soft and nontender Central nervous system: Alert and oriented. No focal neurological deficits. Extremities: CN II - XII intact, strength symmetric in upper extremities 5/5, pt says she is too weak to move her LE and ask to stop exam. She wants to rest.  Skin: diffuse vitiligo   Data Reviewed: I have personally reviewed following labs and imaging studies  CBC:  Recent Labs Lab 07/09/15 0129 07/10/15 0507 07/11/15 0401 07/12/15 0255 07/13/15 0258  WBC 7.8 6.1 6.7 8.0 7.5  HGB 9.3* 8.2* 8.4* 9.2* 9.3*  HCT 27.5* 24.7* 25.3* 27.9* 28.0*  MCV 89.3 90.1 87.5 89.7 90.0  PLT 341 299 303 367 [id number]   Basic Metabolic Panel:  Recent Labs Lab 07/09/15 0129 07/10/15 0507 07/11/15 0401 07/12/15 0255 07/13/15 0258  NA 137 141 138 138 138  K 3.7 4.2 4.1 3.9 3.9  CL 106 111 108 107 106  CO2 20* 21* 23 21* 22  GLUCOSE 210* 114* 135* 92 137*  BUN 13 12 12 14 15   CREATININE 1.49* 1.13* 1.22* 1.18* 1.31*  CALCIUM 9.5 8.6* 8.8* 8.9 8.7*   Cardiac Enzymes:  Recent Labs Lab 07/09/15 0129 07/09/15 0437  TROPONINI 0.03 <0.03   CBG:  Recent Labs Lab 07/12/15 1110 07/12/15 1652 07/12/15 2103 07/13/15 0635 07/13/15 1133  GLUCAP 246* 86 113* 101* 191*   Urine analysis:    Component Value Date/Time   COLORURINE AMBER* 07/09/2015 0132   APPEARANCEUR CLOUDY* 07/09/2015 0132   LABSPEC 1.027 07/09/2015 0132   PHURINE 5.0 07/09/2015 0132   GLUCOSEU 100* 07/09/2015 0132   HGBUR NEGATIVE 07/09/2015 0132   BILIRUBINUR SMALL* 07/09/2015 0132     KETONESUR 15* 07/09/2015 0132   PROTEINUR 30* 07/09/2015 0132   UROBILINOGEN 0.2 09/16/2014 0009   NITRITE NEGATIVE 07/09/2015 0132   LEUKOCYTESUR SMALL* 07/09/2015 0132    Recent Results (from the past 240 hour(s))  Culture, Urine     Status: Abnormal   Collection Time: 07/09/15  9:32 AM  Result Value Ref Range Status   Specimen Description URINE, CLEAN CATCH  Final   Special Requests NONE  Final   Culture MULTIPLE SPECIES PRESENT, SUGGEST RECOLLECTION (A)  Final   Report Status 07/11/2015 FINAL  Final      Radiology Studies: Mr Brain Wo Contrast 07/12/2015  Acute/subacute left PCA territory infarct 2. Moderate atrophy and white matter disease likely reflects the sequela of chronic microvascular ischemia. 3. Remote lacunar infarcts involving the left lentiform nucleus and  corona radiata. 4. Minimal right maxillary sinus disease. These results were called by telephone at the time of interpretation on 07/12/2015 at 8:22 pm to Dr. Janee Morn , who verbally acknowledged these results  Scheduled Meds: .  stroke: mapping our early stages of recovery book   Does not apply Once  . aspirin EC  81 mg Oral Daily  . atorvastatin  20 mg Oral QHS  . clopidogrel  75 mg Oral Daily  . docusate sodium  100 mg Oral Q12H  . famotidine  20 mg Oral QHS  . feeding supplement (ENSURE ENLIVE)  237 mL Oral TID BM  . ferrous sulfate  325 mg Oral Daily  . folic acid  1 mg Oral Daily  . gabapentin  100 mg Oral TID  . insulin aspart  0-15 Units Subcutaneous TID WC  . insulin aspart  0-5 Units Subcutaneous QHS  . metoCLOPramide  10 mg Oral TID AC & HS  . metoprolol tartrate  12.5 mg Oral BID  . midodrine  10 mg Oral TID WC  . sertraline  75 mg Oral Daily  . sodium chloride flush  3 mL Intravenous Q12H   Continuous Infusions:   Time spent: 20 minutes    Faye Ramsay, MD [facility] Pager [phone]  If 7PM-7AM, please contact night-coverage [email or web address] Password [facility] 07/13/2015,  12:27 PM

## 2015-07-13 NOTE — Progress Notes (Signed)
Physical Therapy Treatment Patient Details Name: Maria Fox MRN: [id number] DOB: 1946-02-18 Today's Date: 07/13/2015    History of Present Illness Pt adm with acute kidney injury and borderline orthostatic hypotension. On 5/3 MRI showed Acute/subacute left PCA territory infarct. PMH - orthostatic hypotension, HTN, DM, CVA, CABG, PNA    PT Comments    Pt now found to have acute/subacute lt CVA. Pt remains unsteady and likely due to CVA instead of orthostasis. If family can assist with mobility at home could go home and get home health. If family unable to provide may need to look at other post acute venues.  Follow Up Recommendations  Home health PT;Supervision for mobility/OOB (if family can't provide may need to look at other postacute )     Equipment Recommendations  None recommended by PT    Recommendations for Other Services       Precautions / Restrictions Precautions Precautions: Fall Precaution Comments: watch BP's Restrictions Weight Bearing Restrictions: No    Mobility  Bed Mobility Overal bed mobility: Modified Independent             General bed mobility comments: Incr time  Transfers Overall transfer level: Needs assistance Equipment used: Rolling walker (2 wheeled) Transfers: Sit to/from Stand Sit to Stand: Min guard         General transfer comment: For safety  Ambulation/Gait Ambulation/Gait assistance: Min guard Ambulation Distance (Feet): 180 Feet Assistive device: Rolling walker (2 wheeled) Gait Pattern/deviations: Step-through pattern;Decreased stride length Gait velocity: decr Gait velocity interpretation: Below normal speed for age/gender General Gait Details: Remains unsteady with gait but no overt loss of balance. Pt bumped into one object on rt with  walker.   Stairs            Wheelchair Mobility    Modified Rankin (Stroke Patients Only)       Balance Overall balance assessment: Needs assistance Sitting-balance  support: Feet supported Sitting balance-Leahy Scale: Good     Standing balance support: No upper extremity supported Standing balance-Leahy Scale: Fair                      Cognition Arousal/Alertness: Awake/alert Behavior During Therapy: Anxious Overall Cognitive Status: Within Functional Limits for tasks assessed                      Exercises      General Comments        Pertinent Vitals/Pain      Home Living                      Prior Function            PT Goals (current goals can now be found in the care plan section) Acute Rehab PT Goals Patient Stated Goal: return home Progress towards PT goals: Progressing toward goals    Frequency  Min 3X/week    PT Plan Discharge plan needs to be updated    Co-evaluation             End of Session Equipment Utilized During Treatment: Gait belt Activity Tolerance: Patient limited by fatigue Patient left: in bed;with call bell/phone within reach;with family/visitor present     Time: [id number] PT Time Calculation (min) (ACUTE ONLY): 19 min  Charges:  $Gait Training: 8-22 mins                    G Codes:  Kunal Levario 07/13/2015, 5:12 PM [place]
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
## 2015-07-13 NOTE — Progress Notes (Signed)
*  PRELIMINARY RESULTS* Vascular Ultrasound Carotid Duplex (Doppler) has been completed.   There is no obvious evidence of hemodynamically significant internal carotid artery stenosis bilaterally. Vertebral arteries are patent with antegrade flow.  07/13/2015 6:33 PM Maudry Mayhew, RVT, RDCS, RDMS

## 2015-07-14 ENCOUNTER — Inpatient Hospital Stay (HOSPITAL_COMMUNITY): Payer: Medicare Other

## 2015-07-14 ENCOUNTER — Other Ambulatory Visit (HOSPITAL_COMMUNITY): Payer: Medicare Other

## 2015-07-14 DIAGNOSIS — I6789 Other cerebrovascular disease: Secondary | ICD-10-CM

## 2015-07-14 LAB — GLUCOSE, CAPILLARY
GLUCOSE-CAPILLARY: 116 mg/dL — AB (ref 65–99)
GLUCOSE-CAPILLARY: 184 mg/dL — AB (ref 65–99)
Glucose-Capillary: 163 mg/dL — ABNORMAL HIGH (ref 65–99)
Glucose-Capillary: 147 mg/dL — ABNORMAL HIGH (ref 65–99)

## 2015-07-14 LAB — CBC
HCT: 28.6 % — ABNORMAL LOW (ref 36.0–46.0)
HEMOGLOBIN: 9.1 g/dL — AB (ref 12.0–15.0)
MCH: 28.6 pg (ref 26.0–34.0)
MCHC: 31.8 g/dL (ref 30.0–36.0)
MCV: 89.9 fL (ref 78.0–100.0)
PLATELETS: 388 10*3/uL (ref 150–400)
RBC: 3.18 MIL/uL — AB (ref 3.87–5.11)
RDW: 14 % (ref 11.5–15.5)
WBC: 6.8 10*3/uL (ref 4.0–10.5)

## 2015-07-14 LAB — BASIC METABOLIC PANEL
ANION GAP: 11 (ref 5–15)
BUN: 15 mg/dL (ref 6–20)
CO2: 24 mmol/L (ref 22–32)
Calcium: 8.9 mg/dL (ref 8.9–10.3)
Chloride: 106 mmol/L (ref 101–111)
Creatinine, Ser: 1.3 mg/dL — ABNORMAL HIGH (ref 0.44–1.00)
GFR, EST AFRICAN AMERICAN: 46 mL/min — AB (ref >60)
GFR, EST NON AFRICAN AMERICAN: 40 mL/min — AB (ref >60)
Glucose, Bld: 101 mg/dL — ABNORMAL HIGH (ref 65–99)
POTASSIUM: 4.1 mmol/L (ref 3.5–5.1)
SODIUM: 141 mmol/L (ref 135–145)

## 2015-07-14 NOTE — Progress Notes (Signed)
Pt had 9 beats of Vtach. VSS. Pt with no comlpaints resting in bed. RN will continue to monitor.  Maria Fox paged. No new orders at this time. RN will continue to monitor.   Arnell Sieving, RN

## 2015-07-14 NOTE — Progress Notes (Signed)
    [facility] has been requested to perform a transesophageal echocardiogram on Maria Fox for cerebrovascular accident.  After careful review of history and examination, the risks and benefits of transesophageal echocardiogram have been explained including risks of esophageal damage, perforation (1:10,000 risk), bleeding, pharyngeal hematoma as well as other potential complications associated with conscious sedation including aspiration, arrhythmia, respiratory failure and death. Alternatives to treatment were discussed, questions were answered. Patient is willing to proceed.   BP has been stable with no requirement for pressors. Hgb stable at 9.1. Platelets 367.  Scheduled for 07/17/2015 at 1600 unless the patient gets discharged over the weekend, then it will need to be rescheduled as an outpatient.   Erma Heritage, PA-C 07/14/2015 4:24 PM
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
## 2015-07-14 NOTE — Progress Notes (Signed)
STROKE TEAM PROGRESS NOTE   HISTORY OF PRESENT ILLNESS Maria Fox is an 70 y.o. female brought to hospital due to not feeling well. While in hospital MRI obtained and showed a left PCA infarct. Patient had a fall on the 22 of April which resulted in epistaxis. Went home, and was fine until she awoke on the 29th with dizziness and general weakness and transient blurred vision. Husband noted he does all the cleaning, cooking, driving, billing and she is not involved in a lot. He has noted a decline in her memory since her CABG.she was last known well 4.29.2017, unable to determine time. Modified Rankin: Rankin Score=1. Patient was not administered IV t-PA secondary to being out the of window. She was admitted  for further evaluation and treatment.   SUBJECTIVE (INTERVAL HISTORY) Her life-long female partner ("like her husband but not married, like [place]oner and Mariana Kaufman") is at the bedside. He reports she is more confused since she had a MI 1 year ago in April. Progressively worse with change in her personality.I recounted the  Detailed history from the patient and her partner Patient is currently undergoing a vascular test.    OBJECTIVE Temp:  [97.9 F (36.6 C)-98.3 F (36.8 C)] 97.9 F (36.6 C) (05/05 0332) Pulse Rate:  [63-76] 64 (05/05 0332) Cardiac Rhythm:  [-] Normal sinus rhythm (05/05 0700) Resp:  [16] 16 (05/05 0332) BP: (131-149)/(50-67) 131/65 mmHg (05/05 0332) SpO2:  [92 %-97 %] 97 % (05/05 0332)  CBC:  Recent Labs Lab 07/13/15 0258 07/14/15 0223  WBC 7.5 6.8  HGB 9.3* 9.1*  HCT 28.0* 28.6*  MCV 90.0 89.9  PLT 379 [id number]    Basic Metabolic Panel:  Recent Labs Lab 07/13/15 0258 07/14/15 0223  NA 138 141  K 3.9 4.1  CL 106 106  CO2 22 24  GLUCOSE 137* 101*  BUN 15 15  CREATININE 1.31* 1.30*  CALCIUM 8.7* 8.9    Lipid Panel:    Component Value Date/Time   CHOL 162 07/09/2015 0437   TRIG 183* 07/09/2015 0437   HDL 30* 07/09/2015 0437   CHOLHDL 5.4  07/09/2015 0437   VLDL 37 07/09/2015 0437   LDLCALC 95 07/09/2015 0437   HgbA1c:  Lab Results  Component Value Date   HGBA1C 4.9 07/09/2015   Urine Drug Screen:    Component Value Date/Time   LABOPIA POSITIVE* 07/09/2015 0132   COCAINSCRNUR NONE DETECTED 07/09/2015 0132   LABBENZ NONE DETECTED 07/09/2015 0132   AMPHETMU NONE DETECTED 07/09/2015 0132   THCU NONE DETECTED 07/09/2015 0132   LABBARB NONE DETECTED 07/09/2015 0132      IMAGING  Mr Brain Wo Contrast  07/12/2015  CLINICAL DATA:  Dizziness.  Diffuse weakness. EXAM: MRI HEAD WITHOUT CONTRAST TECHNIQUE: Multiplanar, multiecho pulse sequences of the brain and surrounding structures were obtained without intravenous contrast. COMPARISON:  CT head without contrast 07/02/2015. FINDINGS: Extensive areas are restricted diffusion involves the medial left occipital lobe and parietal lobe as well as the left side of the splenium of the corpus callosum. There scattered areas of restricted diffusion within the left thalamus. Findings are compatible with acute/ subacute nonhemorrhagic left PCA distribution infarct. T2 and FLAIR signal changes suggest a subacute time frame. No acute hemorrhage is associated. There is no mass lesion. Remote lacunar infarcts are present in the left basal ganglia and coronal radiata. Moderate periventricular T2 changes are advanced for age. There is moderate generalized atrophy. The ventricles are proportionate to the degree of atrophy.  Mild white matter changes extend into the brainstem. The cerebellum is unremarkable. The internal auditory canals are within normal limits bilaterally. Flow is present in the major intracranial arteries. Bilateral lens replacements are present. The globes and orbits are otherwise unremarkable. Mild mucosal thickening is evident in the right maxillary sinus. The remaining paranasal sinuses and the mastoid air cells are clear. The skullbase is within normal limits. Midline sagittal images  are unremarkable. IMPRESSION: 1. Acute/subacute left PCA territory infarct 2. Moderate atrophy and white matter disease likely reflects the sequela of chronic microvascular ischemia. 3. Remote lacunar infarcts involving the left lentiform nucleus and corona radiata. 4. Minimal right maxillary sinus disease. These results were called by telephone at the time of interpretation on 07/12/2015 at 8:22 pm to Dr. Janee Morn , who verbally acknowledged these results. Electronically Signed   By: San Morelle M.D.   On: 07/12/2015 20:41    PHYSICAL EXAM Pleasant elderly Caucasian lady currently not in distress. . Afebrile. Head is nontraumatic. Neck is supple without bruit.    Cardiac exam no murmur or gallop. Lungs are clear to auscultation. Distal pulses are well felt. Neurological Exam : Awake alert oriented 3 with normal speech and language function. No word finding difficulties or paraphasic errors.Slightly diminished attention  Follows two-step commands.and recall.Extraocular moments are full range without nystagmus. Dense right homonymous hemianopsia.  Pupils equal reactive. Fundi were not visualized. Face is symmetric without weakness. Tongue is midline. Motor system exam reveals no upper or lower extremity drift. Symmetric and equal strength in all 4 extremities. No focal weakness. Deep tendon reflexes are 1+ symmetric. Plantars are downgoing. Sensation is preserved bilaterally. Gait was not tested. ASSESSMENT/PLAN Maria Fox is a 70 y.o. female with history of coronary artery disease, diabetes, suffered a fall about a week ago with epistaxis presenting this admission with dizziness and transient blurred vision. She did not receive IV t-PA due to unknown last not well.   Stroke:  Left PCA territory infarct embolic secondary to unknown source  MRI  Acute/subacute L PCA territory, small vessel disease, Old left lentiform nucleus and coronal radiata infarct.  Check TCD  Carotid Doppler   pending   2D Echo  pending   Needs TEE to look for embolic source, unable to do TEEs over the weekend. If she remains hospitalized, would schedule for Monday. Otherwise can do as an outpatient.   Recommend cardiology evaluate for possible Implantable loop recorder placement in the event TEE is negative to look for atrial fibrillation as source of embolic stroke.  LDL 95  HgbA1c 4.9  sCDs for VTE prophylaxis  Diet regular Room service appropriate?: Yes; Fluid consistency:: Thin  Diet - low sodium heart healthy  aspirin 81 mg daily and clopidogrel 75 mg daily prior to admission, now on aspirin 81 mg daily and clopidogrel 75 mg daily  Patient counseled to be compliant with her antithrombotic medications  Ongoing aggressive stroke risk factor management  Therapy recommendations:  Home health PT  Disposition:  Return home with significant other  Hypertension Orthostatic hypertension  Stable  Permissive hypertension (OK if < 220/120) but gradually normalize in 5-7 days  Hyperlipidemia  Home meds:  Lipitor 20, resumed in hospital  LDL 95, goal < 70  Continue statin at discharge  Diabetes  HgbA1c 4.9, at goal < 7.0  Other Stroke Risk Factors  Advanced age  Hx stroke/TIA  07/2014 bilateral basal ganglia infarcts - incidental strokes related to her recent cardiac surgery or not and presenting  symptoms are unrelated. And likely related to orthostatic hypotension  Coronary artery disease s/p CABG  Migraines  Diastolic CHF  Hx Carotid disease   Other Active Problems  Baseline cognitive deficits, progressive worsening since CABG 1 year ago  Acute kidney injury  Recurrent falls  Vitiligo  Iron deficiency anemia  Hypoxia  Hospital day # [place] for Pager information 07/14/2015 1:15 PM  I have personally examined this patient, reviewed notes, independently viewed imaging studies, participated in medical decision  making and plan of care. I have made any additions or clarifications directly to the above note. Agree with note above.  She presented with vision difficulties secondary to left posterior cerebral artery infarct etiology to be determined but likely embolic. She remains at risk for neurological worsening, recurrent stroke, TIA needs ongoing stroke evaluation and aggressive risk factor modification. She has also had some cognitive decline and memory loss following her open heart surgery and may have underlying mild vascular cognitive impairment. Greater than 50% time during this 35 minute visit was spent on counseling and coordination of care and STROKE risk, stroke prevention and cognitive impairment.  Antony Contras, MD Medical Director [facility] Pager: [phone] 07/14/2015 2:51 PM   To contact Stroke Continuity provider, please refer to [email or web address] After hours, contact General Neurology

## 2015-07-14 NOTE — Progress Notes (Signed)
Physical Therapy Treatment Patient Details Name: Maria Fox MRN: [id number] DOB: [birth year] Today's Date: 07/14/2015    History of Present Illness Pt adm with acute kidney injury and borderline orthostatic hypotension. On 5/3 MRI showed Acute/subacute left PCA territory infarct. PMH - orthostatic hypotension, HTN, DM, CVA, CABG, PNA    PT Comments    Pt ambulated 180' with RW, no loss of balance. Pt reported mild dizziness in standing. BP supine 135/47, sitting 121/49, standing 91/53.   Follow Up Recommendations  Home health PT;Supervision for mobility/OOB (if family can't provide may need to look at other postacute )     Equipment Recommendations  None recommended by PT    Recommendations for Other Services       Precautions / Restrictions Precautions Precautions: Fall Precaution Comments: watch BP's Restrictions Weight Bearing Restrictions: No    Mobility  Bed Mobility Overal bed mobility: Modified Independent             General bed mobility comments: HOB up 30*  Transfers Overall transfer level: Needs assistance Equipment used: Rolling walker (2 wheeled) Transfers: Sit to/from Stand Sit to Stand: Supervision         General transfer comment: verbal cues for hand placement  Ambulation/Gait Ambulation/Gait assistance: Min guard Ambulation Distance (Feet): 180 Feet Assistive device: Rolling walker (2 wheeled) Gait Pattern/deviations: Step-through pattern;Decreased step length - right;Decreased step length - left Gait velocity: decr Gait velocity interpretation: Below normal speed for age/gender General Gait Details: steady with RW, no LOB, SaO2 97% on RA, HR 84 walking, pt reports mild dizziness, pt had 20 point drop systolic BP with sit to stand (see flowsheets)   Stairs            Wheelchair Mobility    Modified Rankin (Stroke Patients Only)       Balance     Sitting balance-Leahy Scale: Good       Standing balance-Leahy Scale:  Fair                      Cognition Arousal/Alertness: Awake/alert Behavior During Therapy: WFL for tasks assessed/performed Overall Cognitive Status: Within Functional Limits for tasks assessed                      Exercises      General Comments        Pertinent Vitals/Pain Pain Assessment: No/denies pain    Home Living                      Prior Function            PT Goals (current goals can now be found in the care plan section) Acute Rehab PT Goals Patient Stated Goal: return home, work with her flowers PT Goal Formulation: With patient/family Time For Goal Achievement: 07/17/15 Potential to Achieve Goals: Good Progress towards PT goals: Progressing toward goals    Frequency  Min 3X/week    PT Plan Current plan remains appropriate    Co-evaluation             End of Session Equipment Utilized During Treatment: Gait belt Activity Tolerance: Patient limited by fatigue Patient left: in bed;with call bell/phone within reach;with family/visitor present     Time: 1255-1313 PT Time Calculation (min) (ACUTE ONLY): 18 min  Charges:  $Gait Training: 8-22 mins                    G  Codes:      Maria Fox 07/14/2015, 1:20 PM [phone]

## 2015-07-14 NOTE — Evaluation (Signed)
Occupational Therapy Evaluation Patient Details Name: Maria Fox MRN: [id number] DOB: 1945-09-01 Today's Date: 07/14/2015    History of Present Illness Pt adm with acute kidney injury and borderline orthostatic hypotension. On 5/3 MRI showed Acute/subacute left PCA territory infarct. PMH - orthostatic hypotension, HTN, DM, CVA, CABG, PNA   Clinical Impression   Patient presenting with decreased ADL and functional mobility independence secondary to above. Patient independent PTA. Patient currently functioning at an overall supervision to min guard/assist level. Patient will benefit from acute OT to increase overall independence in the areas of ADLs, functional mobility, and overall safety in order to safely discharge home with assistance from her significant other prn. Pt did well during OT eval/treat, pt did have some minimal complaints of dizziness, below are BP readings during session:  BP: Sitting EOB=109/57 After ambulating to sink pt with complaints of feeling "woozy" and needed to sit, BP= 107/55 After brushing teeth in sit to/from stand=99/63    Follow Up Recommendations  No OT follow up;Supervision/Assistance - 24 hour    Equipment Recommendations  None recommended by OT    Recommendations for Other Services  None at this time    Precautions / Restrictions Precautions Precautions: Fall Precaution Comments: watch BP's Restrictions Weight Bearing Restrictions: No    Mobility Bed Mobility Overal bed mobility: Modified Independent General bed mobility comments: HOB up 30*  Transfers Overall transfer level: Needs assistance Equipment used: Rolling walker (2 wheeled) Transfers: Sit to/from Stand Sit to Stand: Supervision;Min guard General transfer comment: verbal cues for hand placement, min guard for safety due to "wooziness"     Balance Overall balance assessment: Needs assistance Sitting-balance support: No upper extremity supported;Feet supported Sitting  balance-Leahy Scale: Good     Standing balance support: No upper extremity supported;During functional activity Standing balance-Leahy Scale: Fair Standing balance comment: Pt standing at sink with no UE support for grooming tasks    ADL Overall ADL's : Needs assistance/impaired Eating/Feeding: Set up;Sitting   Grooming: Set up;Supervision/safety;Sitting;Standing Grooming Details (indicate cue type and reason): supervision due to pt with complaints of dizziness/"wooziness" Upper Body Bathing: Set up;Sitting   Lower Body Bathing: Supervison/ safety;Set up;Sit to/from stand   Upper Body Dressing : Set up;Sitting   Lower Body Dressing: Supervision/safety;Set up;Sit to/from stand   Toilet Transfer: Min Systems developer Details (indicate cue type and reason): simulated  Functional mobility during ADLs: Min guard;Supervision/safety;Cueing for safety;Rolling walker General ADL Comments: Pt somewhat limited by dizziness/"wooziness". Pt able to perform needed tasks, but sat prn due to not feeling well. See clinical impression for BP readings during this Ot eval/treat    Vision Vision Assessment?: No apparent visual deficits          Pertinent Vitals/Pain Pain Assessment: No/denies pain     Hand Dominance Right   Extremity/Trunk Assessment Upper Extremity Assessment Upper Extremity Assessment: Generalized weakness   Lower Extremity Assessment Lower Extremity Assessment: Defer to PT evaluation   Cervical / Trunk Assessment Cervical / Trunk Assessment: Normal   Communication Communication Communication: No difficulties   Cognition Arousal/Alertness: Awake/alert Behavior During Therapy: WFL for tasks assessed/performed Overall Cognitive Status: Within Functional Limits for tasks assessed              Home Living Family/patient expects to be discharged to:: Private residence Living Arrangements: Spouse/significant other ("friend, my live in  husband") Available Help at Discharge: Family;Available 24 hours/day Type of Home: House Home Access: Stairs to enter [employer] of Steps: 2   Home Layout: One  level     Bathroom Shower/Tub: Tub/shower unit;Curtain   Biochemist, clinical: Standard     Home Equipment: Environmental consultant - 2 wheels;Cane - single point;Bedside commode;Shower seat   Prior Functioning/Environment Level of Independence: Independent      OT Diagnosis: Generalized weakness   OT Problem List: Decreased strength;Decreased activity tolerance;Impaired balance (sitting and/or standing);Decreased safety awareness;Decreased knowledge of use of DME or AE;Decreased knowledge of precautions   OT Treatment/Interventions: Self-care/ADL training;Therapeutic exercise;Energy conservation;DME and/or AE instruction;Therapeutic activities;Patient/family education;Balance training    OT Goals(Current goals can be found in the care plan section) Acute Rehab OT Goals Patient Stated Goal: go home soon OT Goal Formulation: With patient/family Time For Goal Achievement: 07/21/15 Potential to Achieve Goals: Good ADL Goals Pt Will Perform Grooming: with modified independence;standing Pt Will Perform Lower Body Bathing: with modified independence;sit to/from stand Pt Will Perform Lower Body Dressing: with modified independence;sit to/from stand Pt Will Transfer to Toilet: with modified independence;ambulating;bedside commode Pt Will Perform Tub/Shower Transfer: Tub transfer;rolling walker;ambulating;shower seat;with supervision Additional ADL Goal #1: Pt will be overall mod I with functional ambulation/mobility using LRAD during ADL   OT Frequency: Min 3X/week   Barriers to D/C: None known at this time   End of Session Equipment Utilized During Treatment: Gait belt;Rolling walker  Activity Tolerance: Patient tolerated treatment well Patient left: in bed;with call bell/phone within reach;with family/visitor present   Time:  1356-1435 OT Time Calculation (min): 39 min Charges:  OT General Charges $OT Visit: 1 Procedure OT Evaluation $OT Eval Moderate Complexity: 1 Procedure OT Treatments $Self Care/Home Management : 8-22 mins  Chrys Racer , MS, OTR/L, CLT Pager: [phone]  07/14/2015, 2:43 PM

## 2015-07-14 NOTE — Progress Notes (Signed)
Echocardiogram 2D Echocardiogram has been performed.  Maria Fox 07/14/2015, 10:10 AM

## 2015-07-15 ENCOUNTER — Inpatient Hospital Stay (HOSPITAL_COMMUNITY): Payer: Medicare Other

## 2015-07-15 DIAGNOSIS — I63532 Cerebral infarction due to unspecified occlusion or stenosis of left posterior cerebral artery: Secondary | ICD-10-CM

## 2015-07-15 LAB — GLUCOSE, CAPILLARY
GLUCOSE-CAPILLARY: 126 mg/dL — AB (ref 65–99)
GLUCOSE-CAPILLARY: 73 mg/dL (ref 65–99)
Glucose-Capillary: 196 mg/dL — ABNORMAL HIGH (ref 65–99)
Glucose-Capillary: 102 mg/dL — ABNORMAL HIGH (ref 65–99)

## 2015-07-15 NOTE — Progress Notes (Signed)
Patient has slept most of the day, easily aroused, denies pain or discomfort. Will continue to monitor.

## 2015-07-15 NOTE — Progress Notes (Signed)
Patient ID: Maria Fox, female   DOB: Dec 08, 1945, 70 y.o.   MRN: [id number]    PROGRESS NOTE    ALIYA KLOPFENSTEIN  [id number] DOB: [birth year] DOA: 07/09/2015  PCP: [name], MD   Outpatient Specialists:   Brief Narrative:  70 y.o. female with CAD, status post CABG 3 [id number], diastolic heart failure,diabetes, presented to [facility] ED with generalized weakness, occasional dyspnea with exertion, several days in duration. [name], [name], has received packing and antibiotics and discharged. She has been week since.   Hospital stay complicated by unsteady gait and intermittent LE weakness, MRI brain requested and was notable for L PCA territory stroke, with remote lacunar strokes raising concern of embolic stroke.   Assessment & Plan:   Principal Problem:   Falls with LE weakness / Orthostatic hypotension / new L PCA territory stroke and remote lacunar strokes - Stroke: Acute/subacute left PCA territory infarct  Remote lacunar infarcts left lentiform nucleus and corona radiata.  - Resultant - LE weakness with orthostatic hypotension, fall  - CT head 4/23 (on last admission) - No acute intracranial abnormalities, atrophy and small vessel ischemic changes. - MRI5/3 - Acute/subacute left PCA territory infarct  Remote lacunar infarcts left lentiform nucleus and corona radiata.  - Carotid Doppler -complete and normal - 2D Echo results pending, TEE scheduled - LDL 4/30 - 95 - HgbA1c 4/30 - 4.9 - VTE prophylaxis - SCD's - Diet - regular diet (libearlized as pt with poor oral intake)  - Antithrombotic - on Aspirin 81 mg PO QD and Plavix 75 mg PO QD at home prior to this admission - Patient counseled to be compliant with his antithrombotic medications - Ongoing aggressive stroke risk factor management - Therapy recommendations:PT - HHPT with supervision, SLP and OT  pending  - Disposition: pending once stroke team evaluates, stroke order set in place     Acute kidney injury ([place]) - appears to be pre renal in etiology from poor oral intake - IVF have been provided but Cr remains 1.2 - 1.3 - diet liberalized in en effort to improve oral intake  - BMP in AM  Active Problems:   Falls - appears to be secondary to orthostatic hypotension - this is likely related to autonomic dysfunction in the setting of long standing diabetes and now new stroke  - HH PT recommended, orders placed     HYPERTENSION, BENIGN SYSTEMIC - reasonable inpatient control, will monitor - pt on metoprolol only     Vitiligo - chronic, outpatient follow up    DM (diabetes mellitus), type 2, uncontrolled w/neurologic complication and gastroparesis, autonomic dysfunction ([place]) - continue SSI for now until oral intake improves - also continue Metoclopramide  - last A1C 4/30 - 4.9    CAD (coronary artery disease), native coronary artery - on aspirin and plavix - follow up on neurology team recommendations     Diastolic CHF ([place]) - chronic, no evidence of volume overload at this time - weight is 68 kg - > 63kg today    Anemia, iron deficiency - no signs of active bleeding - slight drop in Hg likely dilutional, CBC 90    Hypoxia - remains stable with oxygen saturation at target range   DVT prophylaxis: SCD's Code Status: Full  Family Communication: Patient and husband at bedside  Disposition Plan: Home when cleared by neurology team   Consultants:  Neurology  PT/OT/SLP  Procedures:   ECHO 5/4  Vascular carotid doppler 5/4  Transcranial Doppler-pending results  Antimicrobials:   None   Subjective: Reports states that she feels stronger. Patient feels that her only deficit is with her balance. Patient denies any headache nausea vomiting double vision blurred vision.  Objective: Filed Vitals:   07/15/15 0155 07/15/15 0516 07/15/15 0940 07/15/15 1423   BP: 132/59 136/57 122/48 149/56  Pulse: 61 60 68   Temp: 98.6 F (37 C) 98.3 F (36.8 C) 97.7 F (36.5 C)   TempSrc: Oral Oral Oral   Resp: 20 20 16 17   Height:      Weight:  63.957 kg (141 lb)    SpO2: 97% 98% 95% 95%   No intake or output data in the 24 hours ending 07/15/15 1544 Filed Weights   07/09/15 1739 07/14/15 2048 07/15/15 0516  Weight: 68.153 kg (150 lb 4 oz) 63.776 kg (140 lb 9.6 oz) 63.957 kg (141 lb)    Examination:  General exam: Appears calm and comfortable  Respiratory system: Clear to auscultation. Respiratory effort normal. Cardiovascular system: S1 & S2 heard, RRR. No JVD, murmurs, rubs, gallops or clicks. No pedal edema. Gastrointestinal system: Abdomen is nondistended, soft and nontender Central nervous system: Alert and oriented. No focal neurological deficits. Extremities: CN II - XII intact, strength symmetric in upper extremities 5/5 And lower extremities. Skin: diffuse vitiligo   Data Reviewed: I have personally reviewed following labs and imaging studies  CBC:  Recent Labs Lab 07/10/15 0507 07/11/15 0401 07/12/15 0255 07/13/15 0258 07/14/15 0223  WBC 6.1 6.7 8.0 7.5 6.8  HGB 8.2* 8.4* 9.2* 9.3* 9.1*  HCT 24.7* 25.3* 27.9* 28.0* 28.6*  MCV 90.1 87.5 89.7 90.0 89.9  PLT 299 303 367 379 [id number]   Basic Metabolic Panel:  Recent Labs Lab 07/10/15 0507 07/11/15 0401 07/12/15 0255 07/13/15 0258 07/14/15 0223  NA 141 138 138 138 141  K 4.2 4.1 3.9 3.9 4.1  CL 111 108 107 106 106  CO2 21* 23 21* 22 24  GLUCOSE 114* 135* 92 137* 101*  BUN 12 12 14 15 15   CREATININE 1.13* 1.22* 1.18* 1.31* 1.30*  CALCIUM 8.6* 8.8* 8.9 8.7* 8.9   Cardiac Enzymes:  Recent Labs Lab 07/09/15 0129 07/09/15 0437  TROPONINI 0.03 <0.03   CBG:  Recent Labs Lab 07/14/15 1133 07/14/15 1644 07/14/15 2146 07/15/15 0639 07/15/15 1115  GLUCAP 184* 163* 147* 126* 196*   Urine analysis:    Component Value Date/Time   COLORURINE AMBER* 07/09/2015 0132    APPEARANCEUR CLOUDY* 07/09/2015 0132   LABSPEC 1.027 07/09/2015 0132   PHURINE 5.0 07/09/2015 0132   GLUCOSEU 100* 07/09/2015 0132   HGBUR NEGATIVE 07/09/2015 0132   BILIRUBINUR SMALL* 07/09/2015 0132   KETONESUR 15* 07/09/2015 0132   PROTEINUR 30* 07/09/2015 0132   UROBILINOGEN 0.2 09/16/2014 0009   NITRITE NEGATIVE 07/09/2015 0132   LEUKOCYTESUR SMALL* 07/09/2015 0132    Recent Results (from the past 240 hour(s))  Culture, Urine     Status: Abnormal   Collection Time: 07/09/15  9:32 AM  Result Value Ref Range Status   Specimen Description URINE, CLEAN CATCH  Final   Special Requests NONE  Final   Culture MULTIPLE SPECIES PRESENT, SUGGEST RECOLLECTION (A)  Final   Report Status 07/11/2015 FINAL  Final      Radiology Studies: Dg Chest 2 View  07/09/2015  CLINICAL DATA:  Hypoxia. Dizziness. Nausea and vomiting. Cough. Symptoms for 4 days. EXAM:  CHEST  2 VIEW COMPARISON:  09/09/2014 FINDINGS: Normal heart size. Subsegmental atelectasis of the left base. Postoperative changes. External objects project over the upper lung zones. IMPRESSION: Subsegmental atelectasis at the left base. Electronically Signed   By: Marybelle Killings M.D.   On: 07/09/2015 08:59   Ct Head Wo Contrast  07/02/2015  CLINICAL DATA:  Trip and fall injury. No loss of consciousness. Bruising and bleeding over the nose. EXAM: CT HEAD WITHOUT CONTRAST CT MAXILLOFACIAL WITHOUT CONTRAST CT CERVICAL SPINE WITHOUT CONTRAST TECHNIQUE: Multidetector CT imaging of the head, cervical spine, and maxillofacial structures were performed using the standard protocol without intravenous contrast. Multiplanar CT image reconstructions of the cervical spine and maxillofacial structures were also generated. COMPARISON:  CT head 08/07/2014 FINDINGS: CT HEAD FINDINGS Diffuse cerebral atrophy. Ventricular dilatation consistent with central atrophy. Patchy low-attenuation changes in the deep white matter consistent with small vessel ischemia. Old  white matter infarct in the left deep white matter. No mass effect or midline shift. No abnormal extra-axial fluid collections. Gray-white matter junctions are distinct. Basal cisterns are not effaced. No evidence of acute intracranial hemorrhage. No depressed skull fractures. Partial opacification of paranasal sinuses. Mastoid air cells are not opacified. Vascular calcifications. CT MAXILLOFACIAL FINDINGS The globes and extraocular muscles appear intact and symmetrical. Mucosal thickening in the ethmoid air cells. Prominent mucosal thickening in the right maxillary antrum. Air-fluid levels in the sphenoid sinuses. There appear to be acute nondisplaced fractures of the medial and lateral wall of the right maxillary antrum with mildly depressed anterior nasal bone fractures. The orbital rims appear intact. Left maxillary antral walls appear intact. Mandibles, temporomandibular joints, zygomatic arches, and pterygoid plates appear intact. Soft tissues are unremarkable. CT CERVICAL SPINE FINDINGS Straightening of the usual cervical lordosis. This may be due to patient positioning but ligamentous injury or muscle spasm can also have this appearance and are not excluded. No anterior subluxation. Normal alignment of the facet joints. No vertebral compression deformities. No prevertebral soft tissue swelling. Intervertebral disc space heights are preserved. C1-2 articulation appears intact. No focal bone lesion or bone destruction. Bony fluid and material demonstrated in the nasopharynx and oropharynx. This is likely inflammatory. IMPRESSION: No acute intracranial abnormalities. Chronic atrophy and small vessel ischemic changes. Nondisplaced fractures of the right maxillary antral walls and of the anterior nasal bones. Probable inflammatory mucosal thickening and fluid in the paranasal sinuses. Nonspecific straightening of usual cervical lordosis. No acute displaced fractures identified. Probable inflammatory secretions  demonstrated in the nasopharynx. Electronically Signed   By: Lucienne Capers M.D.   On: 07/02/2015 05:26   Ct Cervical Spine Wo Contrast  07/02/2015  CLINICAL DATA:  Trip and fall injury. No loss of consciousness. Bruising and bleeding over the nose. EXAM: CT HEAD WITHOUT CONTRAST CT MAXILLOFACIAL WITHOUT CONTRAST CT CERVICAL SPINE WITHOUT CONTRAST TECHNIQUE: Multidetector CT imaging of the head, cervical spine, and maxillofacial structures were performed using the standard protocol without intravenous contrast. Multiplanar CT image reconstructions of the cervical spine and maxillofacial structures were also generated. COMPARISON:  CT head 08/07/2014 FINDINGS: CT HEAD FINDINGS Diffuse cerebral atrophy. Ventricular dilatation consistent with central atrophy. Patchy low-attenuation changes in the deep white matter consistent with small vessel ischemia. Old white matter infarct in the left deep white matter. No mass effect or midline shift. No abnormal extra-axial fluid collections. Gray-white matter junctions are distinct. Basal cisterns are not effaced. No evidence of acute intracranial hemorrhage. No depressed skull fractures. Partial opacification of paranasal sinuses. Mastoid air cells are not  opacified. Vascular calcifications. CT MAXILLOFACIAL FINDINGS The globes and extraocular muscles appear intact and symmetrical. Mucosal thickening in the ethmoid air cells. Prominent mucosal thickening in the right maxillary antrum. Air-fluid levels in the sphenoid sinuses. There appear to be acute nondisplaced fractures of the medial and lateral wall of the right maxillary antrum with mildly depressed anterior nasal bone fractures. The orbital rims appear intact. Left maxillary antral walls appear intact. Mandibles, temporomandibular joints, zygomatic arches, and pterygoid plates appear intact. Soft tissues are unremarkable. CT CERVICAL SPINE FINDINGS Straightening of the usual cervical lordosis. This may be due to  patient positioning but ligamentous injury or muscle spasm can also have this appearance and are not excluded. No anterior subluxation. Normal alignment of the facet joints. No vertebral compression deformities. No prevertebral soft tissue swelling. Intervertebral disc space heights are preserved. C1-2 articulation appears intact. No focal bone lesion or bone destruction. Bony fluid and material demonstrated in the nasopharynx and oropharynx. This is likely inflammatory. IMPRESSION: No acute intracranial abnormalities. Chronic atrophy and small vessel ischemic changes. Nondisplaced fractures of the right maxillary antral walls and of the anterior nasal bones. Probable inflammatory mucosal thickening and fluid in the paranasal sinuses. Nonspecific straightening of usual cervical lordosis. No acute displaced fractures identified. Probable inflammatory secretions demonstrated in the nasopharynx. Electronically Signed   By: Lucienne Capers M.D.   On: 07/02/2015 05:26   Mr Brain Wo Contrast  07/12/2015  CLINICAL DATA:  Dizziness.  Diffuse weakness. EXAM: MRI HEAD WITHOUT CONTRAST TECHNIQUE: Multiplanar, multiecho pulse sequences of the brain and surrounding structures were obtained without intravenous contrast. COMPARISON:  CT head without contrast 07/02/2015. FINDINGS: Extensive areas are restricted diffusion involves the medial left occipital lobe and parietal lobe as well as the left side of the splenium of the corpus callosum. There scattered areas of restricted diffusion within the left thalamus. Findings are compatible with acute/ subacute nonhemorrhagic left PCA distribution infarct. T2 and FLAIR signal changes suggest a subacute time frame. No acute hemorrhage is associated. There is no mass lesion. Remote lacunar infarcts are present in the left basal ganglia and coronal radiata. Moderate periventricular T2 changes are advanced for age. There is moderate generalized atrophy. The ventricles are proportionate  to the degree of atrophy. Mild white matter changes extend into the brainstem. The cerebellum is unremarkable. The internal auditory canals are within normal limits bilaterally. Flow is present in the major intracranial arteries. Bilateral lens replacements are present. The globes and orbits are otherwise unremarkable. Mild mucosal thickening is evident in the right maxillary sinus. The remaining paranasal sinuses and the mastoid air cells are clear. The skullbase is within normal limits. Midline sagittal images are unremarkable. IMPRESSION: 1. Acute/subacute left PCA territory infarct 2. Moderate atrophy and white matter disease likely reflects the sequela of chronic microvascular ischemia. 3. Remote lacunar infarcts involving the left lentiform nucleus and corona radiata. 4. Minimal right maxillary sinus disease. These results were called by telephone at the time of interpretation on 07/12/2015 at 8:22 pm to Dr. Janee Morn , who verbally acknowledged these results. Electronically Signed   By: San Morelle M.D.   On: 07/12/2015 20:41   Ct Maxillofacial Wo Cm  07/02/2015  CLINICAL DATA:  Trip and fall injury. No loss of consciousness. Bruising and bleeding over the nose. EXAM: CT HEAD WITHOUT CONTRAST CT MAXILLOFACIAL WITHOUT CONTRAST CT CERVICAL SPINE WITHOUT CONTRAST TECHNIQUE: Multidetector CT imaging of the head, cervical spine, and maxillofacial structures were performed using the standard protocol without intravenous contrast. Multiplanar CT  image reconstructions of the cervical spine and maxillofacial structures were also generated. COMPARISON:  CT head 08/07/2014 FINDINGS: CT HEAD FINDINGS Diffuse cerebral atrophy. Ventricular dilatation consistent with central atrophy. Patchy low-attenuation changes in the deep white matter consistent with small vessel ischemia. Old white matter infarct in the left deep white matter. No mass effect or midline shift. No abnormal extra-axial fluid collections.  Gray-white matter junctions are distinct. Basal cisterns are not effaced. No evidence of acute intracranial hemorrhage. No depressed skull fractures. Partial opacification of paranasal sinuses. Mastoid air cells are not opacified. Vascular calcifications. CT MAXILLOFACIAL FINDINGS The globes and extraocular muscles appear intact and symmetrical. Mucosal thickening in the ethmoid air cells. Prominent mucosal thickening in the right maxillary antrum. Air-fluid levels in the sphenoid sinuses. There appear to be acute nondisplaced fractures of the medial and lateral wall of the right maxillary antrum with mildly depressed anterior nasal bone fractures. The orbital rims appear intact. Left maxillary antral walls appear intact. Mandibles, temporomandibular joints, zygomatic arches, and pterygoid plates appear intact. Soft tissues are unremarkable. CT CERVICAL SPINE FINDINGS Straightening of the usual cervical lordosis. This may be due to patient positioning but ligamentous injury or muscle spasm can also have this appearance and are not excluded. No anterior subluxation. Normal alignment of the facet joints. No vertebral compression deformities. No prevertebral soft tissue swelling. Intervertebral disc space heights are preserved. C1-2 articulation appears intact. No focal bone lesion or bone destruction. Bony fluid and material demonstrated in the nasopharynx and oropharynx. This is likely inflammatory. IMPRESSION: No acute intracranial abnormalities. Chronic atrophy and small vessel ischemic changes. Nondisplaced fractures of the right maxillary antral walls and of the anterior nasal bones. Probable inflammatory mucosal thickening and fluid in the paranasal sinuses. Nonspecific straightening of usual cervical lordosis. No acute displaced fractures identified. Probable inflammatory secretions demonstrated in the nasopharynx. Electronically Signed   By: Lucienne Capers M.D.   On: 07/02/2015 05:26     Scheduled  Meds: . aspirin EC  81 mg Oral Daily  . atorvastatin  20 mg Oral QHS  . clopidogrel  75 mg Oral Daily  . docusate sodium  100 mg Oral Q12H  . famotidine  20 mg Oral QHS  . feeding supplement (ENSURE ENLIVE)  237 mL Oral TID BM  . ferrous sulfate  325 mg Oral Daily  . folic acid  1 mg Oral Daily  . gabapentin  100 mg Oral TID  . insulin aspart  0-15 Units Subcutaneous TID WC  . insulin aspart  0-5 Units Subcutaneous QHS  . metoCLOPramide  10 mg Oral TID AC & HS  . metoprolol tartrate  12.5 mg Oral BID  . midodrine  10 mg Oral TID WC  . sertraline  75 mg Oral Daily  . sodium chloride flush  3 mL Intravenous Q12H   Continuous Infusions:   Time spent: 20 minutes    Elwin Mocha, MD [facility] Pager [phone]  If 7PM-7AM, please contact night-coverage [email or web address] Password [facility] 07/15/2015, 3:44 PM

## 2015-07-15 NOTE — Progress Notes (Signed)
Transcranial Doppler has been completed.  Landry Mellow, RDMS, RVT 07/15/2015

## 2015-07-15 NOTE — Progress Notes (Signed)
Patient is currently off the unit to vascular. Spouse in the room waiting for her. Denied pain/discomforts before leaving, asked to save her breakfast since she was sleeping and had not started eating. No order complaints or requests at this time. Cup of coffee given to spouse per his request. --

## 2015-07-15 NOTE — Progress Notes (Signed)
STROKE TEAM PROGRESS NOTE   HISTORY OF PRESENT ILLNESS Maria Fox is an 70 y.o. female brought to hospital due to not feeling well. While in hospital MRI obtained and showed a left PCA infarct. Patient had a fall on the 22 of April which resulted in epistaxis. Went home, and was fine until she awoke on the 29th with dizziness and general weakness and transient blurred vision. Husband noted he does all the cleaning, cooking, driving, billing and she is not involved in a lot. He has noted a decline in her memory since her CABG.she was last known well 4.29.2017, unable to determine time. Modified Rankin: Rankin Score=1. Patient was not administered IV t-PA secondary to being out the of window. She was admitted  for further evaluation and treatment.   SUBJECTIVE (INTERVAL HISTORY) Her life-long female partner  is at the bedside. He reports she is better today.. TEE and loop recorder implant for Monday.Carotid Dopplers are unremarkable. Transthoracic echo is pending.   OBJECTIVE Temp:  [97.7 F (36.5 C)-98.6 F (37 C)] 97.7 F (36.5 C) (05/06 0940) Pulse Rate:  [60-72] 68 (05/06 0940) Cardiac Rhythm:  [-] Sinus bradycardia (05/06 0700) Resp:  [16-20] 16 (05/06 0940) BP: (115-161)/(44-59) 122/48 mmHg (05/06 0940) SpO2:  [95 %-98 %] 95 % (05/06 0940) Weight:  [140 lb 9.6 oz (63.776 kg)-141 lb (63.957 kg)] 141 lb (63.957 kg) (05/06 0516)  CBC:   Recent Labs Lab 07/13/15 0258 07/14/15 0223  WBC 7.5 6.8  HGB 9.3* 9.1*  HCT 28.0* 28.6*  MCV 90.0 89.9  PLT 379 [id number]    Basic Metabolic Panel:   Recent Labs Lab 07/13/15 0258 07/14/15 0223  NA 138 141  K 3.9 4.1  CL 106 106  CO2 22 24  GLUCOSE 137* 101*  BUN 15 15  CREATININE 1.31* 1.30*  CALCIUM 8.7* 8.9    Lipid Panel:     Component Value Date/Time   CHOL 162 07/09/2015 0437   TRIG 183* 07/09/2015 0437   HDL 30* 07/09/2015 0437   CHOLHDL 5.4 07/09/2015 0437   VLDL 37 07/09/2015 0437   LDLCALC 95 07/09/2015 0437    HgbA1c:  Lab Results  Component Value Date   HGBA1C 4.9 07/09/2015   Urine Drug Screen:     Component Value Date/Time   LABOPIA POSITIVE* 07/09/2015 0132   COCAINSCRNUR NONE DETECTED 07/09/2015 0132   LABBENZ NONE DETECTED 07/09/2015 0132   AMPHETMU NONE DETECTED 07/09/2015 0132   THCU NONE DETECTED 07/09/2015 0132   LABBARB NONE DETECTED 07/09/2015 0132      IMAGING  No results found.  PHYSICAL EXAM Pleasant elderly Caucasian lady currently not in distress. . Afebrile. Head is nontraumatic. Neck is supple without bruit.    Cardiac exam no murmur or gallop. Lungs are clear to auscultation. Distal pulses are well felt. Neurological Exam : Awake alert oriented 3 with normal speech and language function. No word finding difficulties or paraphasic errors.Slightly diminished attention  Follows two-step commands.and recall.Extraocular moments are full range without nystagmus. Dense right homonymous hemianopsia.  Pupils equal reactive. Fundi were not visualized. Face is symmetric without weakness. Tongue is midline. Motor system exam reveals no upper or lower extremity drift. Symmetric and equal strength in all 4 extremities. No focal weakness. Deep tendon reflexes are 1+ symmetric. Plantars are downgoing. Sensation is preserved bilaterally. Gait was not tested. ASSESSMENT/PLAN [name] is a 70 y.o. female with history of coronary artery disease, diabetes, suffered a fall about a week ago with  epistaxis presenting this admission with dizziness and transient blurred vision. She did not receive IV t-PA due to unknown last not well.   Stroke:  Left PCA territory infarct embolic secondary to unknown source  MRI  Acute/subacute L PCA territory, small vessel disease, Old left lentiform nucleus and coronal radiata infarct.  Check TCD Carotid Doppler  There is no obvious evidence of hemodynamically significant internal carotid artery stenosis bilaterally. Vertebral arteries  are  patent with antegrade flow.  2D Echo  pending   Needs TEE to look for embolic source, unable to do TEEs over the weekend. If she remains hospitalized, would schedule for Monday. Otherwise can do as an outpatient.   Recommend cardiology evaluate for possible Implantable loop recorder placement in the event TEE is negative to look for atrial fibrillation as source of embolic stroke.  LDL 95  HgbA1c 4.9  sCDs for VTE prophylaxis Diet regular Room service appropriate?: Yes; Fluid consistency:: Thin Diet - low sodium heart healthy  aspirin 81 mg daily and clopidogrel 75 mg daily prior to admission, now on aspirin 81 mg daily and clopidogrel 75 mg daily  Patient counseled to be compliant with her antithrombotic medications  Ongoing aggressive stroke risk factor management  Therapy recommendations:  Home health PT  Disposition:  Return home with significant other  Hypertension Orthostatic hypertension  Stable  Permissive hypertension (OK if < 220/120) but gradually normalize in 5-7 days  Hyperlipidemia  Home meds:  Lipitor 20, resumed in hospital  LDL 95, goal < 70  Continue statin at discharge  Diabetes  HgbA1c 4.9, at goal < 7.0  Other Stroke Risk Factors  Advanced age  Hx stroke/TIA  07/2014 bilateral basal ganglia infarcts - incidental strokes related to her recent cardiac surgery or not and presenting symptoms are unrelated. And likely related to orthostatic hypotension  Coronary artery disease s/p CABG  Migraines  Diastolic CHF  Hx Carotid disease   Other Active Problems  Baseline cognitive deficits, progressive worsening since CABG 1 year ago  Acute kidney injury  Recurrent falls  Vitiligo  Iron deficiency anemia  Hypoxia  Hospital day # 2   I have personally examined this patient, reviewed notes, independently viewed imaging studies, participated in medical decision making and plan of care. I have made any additions or clarifications  directly to the above note. Continue aspirin and Plavix.TEE and loop recorder on Monday.Greater than 50% time during this 15 minute visit was spent on counseling and coordination of care and STROKE risk, stroke prevention and cognitive impairment.  Antony Contras, MD Medical Director [facility] Pager: [phone] 07/15/2015 12:49 PM   To contact Stroke Continuity provider, please refer to [email or web address] After hours, contact General Neurology

## 2015-07-16 LAB — CBC WITH DIFFERENTIAL/PLATELET
Basophils Absolute: 0.1 10*3/uL (ref 0.0–0.1)
Basophils Relative: 1 %
Eosinophils Absolute: 0.4 10*3/uL (ref 0.0–0.7)
Eosinophils Relative: 6 %
HEMATOCRIT: 31.8 % — AB (ref 36.0–46.0)
HEMOGLOBIN: 10.2 g/dL — AB (ref 12.0–15.0)
LYMPHS ABS: 1.9 10*3/uL (ref 0.7–4.0)
LYMPHS PCT: 28 %
MCH: 29 pg (ref 26.0–34.0)
MCHC: 32.1 g/dL (ref 30.0–36.0)
MCV: 90.3 fL (ref 78.0–100.0)
MONOS PCT: 9 %
Monocytes Absolute: 0.6 10*3/uL (ref 0.1–1.0)
NEUTROS PCT: 56 %
Neutro Abs: 3.9 10*3/uL (ref 1.7–7.7)
Platelets: 377 10*3/uL (ref 150–400)
RBC: 3.52 MIL/uL — AB (ref 3.87–5.11)
RDW: 14.3 % (ref 11.5–15.5)
WBC: 7 10*3/uL (ref 4.0–10.5)

## 2015-07-16 LAB — BASIC METABOLIC PANEL
Anion gap: 12 (ref 5–15)
BUN: 16 mg/dL (ref 6–20)
CALCIUM: 9.6 mg/dL (ref 8.9–10.3)
CO2: 22 mmol/L (ref 22–32)
CREATININE: 1.46 mg/dL — AB (ref 0.44–1.00)
Chloride: 106 mmol/L (ref 101–111)
GFR calc Af Amer: 40 mL/min — ABNORMAL LOW (ref >60)
GFR, EST NON AFRICAN AMERICAN: 35 mL/min — AB (ref >60)
GLUCOSE: 139 mg/dL — AB (ref 65–99)
POTASSIUM: 4.8 mmol/L (ref 3.5–5.1)
SODIUM: 140 mmol/L (ref 135–145)

## 2015-07-16 LAB — GLUCOSE, CAPILLARY
GLUCOSE-CAPILLARY: 200 mg/dL — AB (ref 65–99)
Glucose-Capillary: 109 mg/dL — ABNORMAL HIGH (ref 65–99)
Glucose-Capillary: 130 mg/dL — ABNORMAL HIGH (ref 65–99)
Glucose-Capillary: 167 mg/dL — ABNORMAL HIGH (ref 65–99)

## 2015-07-16 NOTE — Progress Notes (Signed)
Patient in bed this morning with family at her bedside, she is alert, denies having any pain. Will continue to monitor.

## 2015-07-16 NOTE — Progress Notes (Signed)
STROKE TEAM PROGRESS NOTE   HISTORY OF PRESENT ILLNESS Maria Fox is an 70 y.o. female brought to hospital due to not feeling well. While in hospital MRI obtained and showed a left PCA infarct. Patient had a fall on the 22 of April which resulted in epistaxis. Went home, and was fine until she awoke on the 29th with dizziness and general weakness and transient blurred vision. Husband noted he does all the cleaning, cooking, driving, billing and she is not involved in a lot. He has noted a decline in her memory since her CABG.she was last known well 4.29.2017, unable to determine time. Modified Rankin: Rankin Score=1. Patient was not administered IV t-PA secondary to being out the of window. She was admitted  for further evaluation and treatment.   SUBJECTIVE (INTERVAL HISTORY) Her life-long female partner  is at the bedside. He reports she is better today.. TEE and loop recorder implant planned for Monday . Transthoracic echo is yet  pending.   OBJECTIVE Temp:  [97.5 F (36.4 C)-98.2 F (36.8 C)] 98.1 F (36.7 C) (05/07 0955) Pulse Rate:  [62-65] 64 (05/07 0955) Cardiac Rhythm:  [-] Sinus bradycardia (05/07 0700) Resp:  [16-18] 18 (05/07 0955) BP: (131-154)/(50-66) 131/50 mmHg (05/07 0955) SpO2:  [95 %-98 %] 97 % (05/07 0955)  CBC:   Recent Labs Lab 07/14/15 0223 07/16/15 0812  WBC 6.8 7.0  NEUTROABS  --  3.9  HGB 9.1* 10.2*  HCT 28.6* 31.8*  MCV 89.9 90.3  PLT 388 [id number]    Basic Metabolic Panel:   Recent Labs Lab 07/14/15 0223 07/16/15 0812  NA 141 140  K 4.1 4.8  CL 106 106  CO2 24 22  GLUCOSE 101* 139*  BUN 15 16  CREATININE 1.30* 1.46*  CALCIUM 8.9 9.6    Lipid Panel:     Component Value Date/Time   CHOL 162 07/09/2015 0437   TRIG 183* 07/09/2015 0437   HDL 30* 07/09/2015 0437   CHOLHDL 5.4 07/09/2015 0437   VLDL 37 07/09/2015 0437   LDLCALC 95 07/09/2015 0437   HgbA1c:  Lab Results  Component Value Date   HGBA1C 4.9 07/09/2015   Urine Drug Screen:      Component Value Date/Time   LABOPIA POSITIVE* 07/09/2015 0132   COCAINSCRNUR NONE DETECTED 07/09/2015 0132   LABBENZ NONE DETECTED 07/09/2015 0132   AMPHETMU NONE DETECTED 07/09/2015 0132   THCU NONE DETECTED 07/09/2015 0132   LABBARB NONE DETECTED 07/09/2015 0132      IMAGING  No results found.  PHYSICAL EXAM Pleasant elderly Caucasian lady currently not in distress. . Afebrile. Head is nontraumatic. Neck is supple without bruit.    Cardiac exam no murmur or gallop. Lungs are clear to auscultation. Distal pulses are well felt. Neurological Exam : Awake alert oriented 3 with normal speech and language function. No word finding difficulties or paraphasic errors.Slightly diminished attention  Follows two-step commands.and recall.Extraocular moments are full range without nystagmus. Dense right homonymous hemianopsia.  Pupils equal reactive. Fundi were not visualized. Face is symmetric without weakness. Tongue is midline. Motor system exam reveals no upper or lower extremity drift. Symmetric and equal strength in all 4 extremities. No focal weakness. Deep tendon reflexes are 1+ symmetric. Plantars are downgoing. Sensation is preserved bilaterally. Gait was not tested. ASSESSMENT/PLAN Ms. Maria Fox is a 70 y.o. female with history of coronary artery disease, diabetes, suffered a fall about a week ago with epistaxis presenting this admission with dizziness and transient blurred vision.  She did not receive IV t-PA due to unknown last not well.   Stroke:  Left PCA territory infarct embolic secondary to unknown source  MRI  Acute/subacute L PCA territory, small vessel disease, Old left lentiform nucleus and coronal radiata infarct.  Check TCD Carotid Doppler  There is no obvious evidence of hemodynamically significant internal carotid artery stenosis bilaterally. Vertebral arteries  are patent with antegrade flow.  2D Echo  pending   Needs TEE to look for embolic source, unable to  do TEEs over the weekend. If she remains hospitalized, would schedule for Monday. Otherwise can do as an outpatient.   Recommend cardiology evaluate for possible Implantable loop recorder placement in the event TEE is negative to look for atrial fibrillation as source of embolic stroke.  LDL 95  HgbA1c 4.9  sCDs for VTE prophylaxis Diet regular Room service appropriate?: Yes; Fluid consistency:: Thin Diet - low sodium heart healthy  aspirin 81 mg daily and clopidogrel 75 mg daily prior to admission, now on aspirin 81 mg daily and clopidogrel 75 mg daily  Patient counseled to be compliant with her antithrombotic medications  Ongoing aggressive stroke risk factor management  Therapy recommendations:  Home health PT  Disposition:  Return home with significant other  Hypertension Orthostatic hypertension  Stable  Permissive hypertension (OK if < 220/120) but gradually normalize in 5-7 days  Hyperlipidemia  Home meds:  Lipitor 20, resumed in hospital  LDL 95, goal < 70  Continue statin at discharge  Diabetes  HgbA1c 4.9, at goal < 7.0  Other Stroke Risk Factors  Advanced age  Hx stroke/TIA  07/2014 bilateral basal ganglia infarcts - incidental strokes related to her recent cardiac surgery or not and presenting symptoms are unrelated. And likely related to orthostatic hypotension  Coronary artery disease s/p CABG  Migraines  Diastolic CHF  Hx Carotid disease   Other Active Problems  Baseline cognitive deficits, progressive worsening since CABG 1 year ago  Acute kidney injury  Recurrent falls  Vitiligo  Iron deficiency anemia  Hypoxia  Hospital day # 3   I have personally examined this patient, reviewed notes, independently viewed imaging studies, participated in medical decision making and plan of care. I have made any additions or clarifications directly to the above note. Continue aspirin and Plavix.TEE and loop recorder on Monday.Greater than 50%  time during this 15 minute visit was spent on counseling and coordination of care, stroke risk and prevention and cognitive impairment.  Antony Contras, MD Medical Director [place] Pager: [phone] 07/16/2015 12:01 PM   To contact Stroke Continuity provider, please refer to [email or web address] After hours, contact General Neurology

## 2015-07-16 NOTE — Progress Notes (Signed)
Patient ID: Maria Fox, female   DOB: [birth year], 70 y.o.   MRN: [id number]    PROGRESS NOTE    Maria Fox  [name] DOB: [birth year] DOA: 07/09/2015  PCP: [name], MD   Outpatient Specialists:   Brief Narrative:  70 y.o. female with CAD, status post CABG 3 Q000111Q, diastolic heart failure,diabetes, presented to [facility] ED with generalized weakness, occasional dyspnea with exertion, several days in duration. [name], [name], has received packing and antibiotics and discharged. She has been week since.   Hospital stay complicated by unsteady gait and intermittent LE weakness, MRI brain requested and was notable for L PCA territory stroke, with remote lacunar strokes raising concern of embolic stroke.   Assessment & Plan:   Principal Problem:   Falls with LE weakness / Orthostatic hypotension / new L PCA territory stroke and remote lacunar strokes - Stroke: Acute/subacute left PCA territory infarct  Remote lacunar infarcts left lentiform nucleus and corona radiata.  - Resultant - LE weakness with orthostatic hypotension, fall  - CT head 4/23 (on last admission) - No acute intracranial abnormalities, atrophy and small vessel ischemic changes. - MRI5/3 - Acute/subacute left PCA territory infarct  Remote lacunar infarcts left lentiform nucleus and corona radiata.  - Carotid Doppler -complete and normal - 2D Echo results pending, TEE scheduled for 5/8 - LDL 4/30 - 95 - HgbA1c 4/30 - 4.9 - VTE prophylaxis - SCD's - Diet - regular diet (libearlized as pt with poor oral intake)  - Antithrombotic - on Aspirin 81 mg PO QD and Plavix 75 mg PO QD at home prior to this admission - Patient counseled to be compliant with his antithrombotic medications - Ongoing aggressive stroke risk factor management - Therapy recommendations:PT - HHPT with supervision, SLP  - none and OT pending  - pt may need SNF if family cannot do supervision with mobility - Disposition: pending once stroke team evaluates, stroke order set in place     Acute kidney injury ([place]) - appears to be pre renal in etiology from poor oral intake - IVF have been provided but Cr remains 1.2 - 1.4 - diet liberalized in an effort to improve oral intake  - BMP in AM  Active Problems:   Falls - appears to be secondary to orthostatic hypotension - this is likely related to autonomic dysfunction in the setting of long standing diabetes and now new stroke  - HH PT recommended, orders placed     HYPERTENSION, BENIGN SYSTEMIC - reasonable inpatient control, will monitor - pt on metoprolol only     Vitiligo - chronic, outpatient follow up    DM (diabetes mellitus), type 2, uncontrolled w/neurologic complication and gastroparesis, autonomic dysfunction ([place]) - continue SSI for now until oral intake improves - also continue Metoclopramide  - last A1C 4/30 - 4.9    CAD (coronary artery disease), native coronary artery - on aspirin and plavix - follow up on neurology team recommendations     Diastolic CHF ([place]) - chronic, no evidence of volume overload at this time - weight is 68 kg - > 63kg today    Anemia, iron deficiency - no signs of active bleeding - slight drop in Hg likely dilutional, CBC 90    Hypoxia - remains stable with oxygen saturation at target range   DVT prophylaxis: SCD's Code Status: Full  Family Communication: Patient  and husband at bedside  Disposition Plan: Home when cleared by neurology team   Consultants:   Neurology  PT/OT/SLP  Procedures:   ECHO 5/4  Vascular carotid doppler 5/4  Transcranial Doppler-pending results  Antimicrobials:   None   Subjective: Reports states that she feels stronger.  Patient denies any headache nausea vomiting double vision blurred vision.  Objective: Filed Vitals:   07/16/15 0535 07/16/15 0955  07/16/15 1310 07/16/15 1708  BP:  131/50 146/65 145/64  Pulse: 65 64 59 56  Temp: 97.5 F (36.4 C) 98.1 F (36.7 C) 98.5 F (36.9 C) 98 F (36.7 C)  TempSrc: Oral Oral Oral Oral  Resp: 18 18 18 18   Height:      Weight:      SpO2: 96% 97% 97% 95%   No intake or output data in the 24 hours ending 07/16/15 1746 Filed Weights   07/09/15 1739 07/14/15 2048 07/15/15 0516  Weight: 68.153 kg (150 lb 4 oz) 63.776 kg (140 lb 9.6 oz) 63.957 kg (141 lb)    Examination:  General exam: Appears calm and comfortable  Respiratory system: Clear to auscultation. Respiratory effort normal. Cardiovascular system: S1 & S2 heard, RRR. No JVD, murmurs, rubs, gallops or clicks. No pedal edema. Gastrointestinal system: Abdomen is nondistended, soft and nontender Central nervous system: Alert and oriented. No focal neurological deficits. Extremities: CN II - XII intact, strength symmetric in upper extremities 5/5 And lower extremities. Abn FNF on Left. Skin: diffuse vitiligo   Data Reviewed: I have personally reviewed following labs and imaging studies  CBC:  Recent Labs Lab 07/11/15 0401 07/12/15 0255 07/13/15 0258 07/14/15 0223 07/16/15 0812  WBC 6.7 8.0 7.5 6.8 7.0  NEUTROABS  --   --   --   --  3.9  HGB 8.4* 9.2* 9.3* 9.1* 10.2*  HCT 25.3* 27.9* 28.0* 28.6* 31.8*  MCV 87.5 89.7 90.0 89.9 90.3  PLT 303 367 379 388 Q000111Q   Basic Metabolic Panel:  Recent Labs Lab 07/11/15 0401 07/12/15 0255 07/13/15 0258 07/14/15 0223 07/16/15 0812  NA 138 138 138 141 140  K 4.1 3.9 3.9 4.1 4.8  CL 108 107 106 106 106  CO2 23 21* 22 24 22   GLUCOSE 135* 92 137* 101* 139*  BUN 12 14 15 15 16   CREATININE 1.22* 1.18* 1.31* 1.30* 1.46*  CALCIUM 8.8* 8.9 8.7* 8.9 9.6   Cardiac Enzymes: No results for input(s): CKTOTAL, CKMB, CKMBINDEX, TROPONINI in the last 168 hours. CBG:  Recent Labs Lab 07/15/15 1616 07/15/15 2302 07/16/15 0654 07/16/15 1112 07/16/15 1622  GLUCAP 73 102* 130* 200* 109*     Urine analysis:    Component Value Date/Time   COLORURINE AMBER* 07/09/2015 0132   APPEARANCEUR CLOUDY* 07/09/2015 0132   LABSPEC 1.027 07/09/2015 0132   PHURINE 5.0 07/09/2015 0132   GLUCOSEU 100* 07/09/2015 0132   HGBUR NEGATIVE 07/09/2015 0132   BILIRUBINUR SMALL* 07/09/2015 0132   KETONESUR 15* 07/09/2015 0132   PROTEINUR 30* 07/09/2015 0132   UROBILINOGEN 0.2 09/16/2014 0009   NITRITE NEGATIVE 07/09/2015 0132   LEUKOCYTESUR SMALL* 07/09/2015 0132    Recent Results (from the past 240 hour(s))  Culture, Urine     Status: Abnormal   Collection Time: 07/09/15  9:32 AM  Result Value Ref Range Status   Specimen Description URINE, CLEAN CATCH  Final   Special Requests NONE  Final   Culture MULTIPLE SPECIES PRESENT, SUGGEST RECOLLECTION (A)  Final   Report Status 07/11/2015 FINAL  Final  Radiology Studies: Dg Chest 2 View  07/09/2015  CLINICAL DATA:  Hypoxia. Dizziness. Nausea and vomiting. Cough. Symptoms for 4 days. EXAM: CHEST  2 VIEW COMPARISON:  09/09/2014 FINDINGS: Normal heart size. Subsegmental atelectasis of the left base. Postoperative changes. External objects project over the upper lung zones. IMPRESSION: Subsegmental atelectasis at the left base. Electronically Signed   By: Marybelle Killings M.D.   On: 07/09/2015 08:59   Ct Head Wo Contrast  07/02/2015  CLINICAL DATA:  Trip and fall injury. No loss of consciousness. Bruising and bleeding over the nose. EXAM: CT HEAD WITHOUT CONTRAST CT MAXILLOFACIAL WITHOUT CONTRAST CT CERVICAL SPINE WITHOUT CONTRAST TECHNIQUE: Multidetector CT imaging of the head, cervical spine, and maxillofacial structures were performed using the standard protocol without intravenous contrast. Multiplanar CT image reconstructions of the cervical spine and maxillofacial structures were also generated. COMPARISON:  CT head 08/07/2014 FINDINGS: CT HEAD FINDINGS Diffuse cerebral atrophy. Ventricular dilatation consistent with central atrophy. Patchy  low-attenuation changes in the deep white matter consistent with small vessel ischemia. Old white matter infarct in the left deep white matter. No mass effect or midline shift. No abnormal extra-axial fluid collections. Gray-white matter junctions are distinct. Basal cisterns are not effaced. No evidence of acute intracranial hemorrhage. No depressed skull fractures. Partial opacification of paranasal sinuses. Mastoid air cells are not opacified. Vascular calcifications. CT MAXILLOFACIAL FINDINGS The globes and extraocular muscles appear intact and symmetrical. Mucosal thickening in the ethmoid air cells. Prominent mucosal thickening in the right maxillary antrum. Air-fluid levels in the sphenoid sinuses. There appear to be acute nondisplaced fractures of the medial and lateral wall of the right maxillary antrum with mildly depressed anterior nasal bone fractures. The orbital rims appear intact. Left maxillary antral walls appear intact. Mandibles, temporomandibular joints, zygomatic arches, and pterygoid plates appear intact. Soft tissues are unremarkable. CT CERVICAL SPINE FINDINGS Straightening of the usual cervical lordosis. This may be due to patient positioning but ligamentous injury or muscle spasm can also have this appearance and are not excluded. No anterior subluxation. Normal alignment of the facet joints. No vertebral compression deformities. No prevertebral soft tissue swelling. Intervertebral disc space heights are preserved. C1-2 articulation appears intact. No focal bone lesion or bone destruction. Bony fluid and material demonstrated in the nasopharynx and oropharynx. This is likely inflammatory. IMPRESSION: No acute intracranial abnormalities. Chronic atrophy and small vessel ischemic changes. Nondisplaced fractures of the right maxillary antral walls and of the anterior nasal bones. Probable inflammatory mucosal thickening and fluid in the paranasal sinuses. Nonspecific straightening of usual  cervical lordosis. No acute displaced fractures identified. Probable inflammatory secretions demonstrated in the nasopharynx. Electronically Signed   By: Lucienne Capers M.D.   On: 07/02/2015 05:26   Ct Cervical Spine Wo Contrast  07/02/2015  CLINICAL DATA:  Trip and fall injury. No loss of consciousness. Bruising and bleeding over the nose. EXAM: CT HEAD WITHOUT CONTRAST CT MAXILLOFACIAL WITHOUT CONTRAST CT CERVICAL SPINE WITHOUT CONTRAST TECHNIQUE: Multidetector CT imaging of the head, cervical spine, and maxillofacial structures were performed using the standard protocol without intravenous contrast. Multiplanar CT image reconstructions of the cervical spine and maxillofacial structures were also generated. COMPARISON:  CT head 08/07/2014 FINDINGS: CT HEAD FINDINGS Diffuse cerebral atrophy. Ventricular dilatation consistent with central atrophy. Patchy low-attenuation changes in the deep white matter consistent with small vessel ischemia. Old white matter infarct in the left deep white matter. No mass effect or midline shift. No abnormal extra-axial fluid collections. Gray-white matter junctions are distinct. Basal cisterns  are not effaced. No evidence of acute intracranial hemorrhage. No depressed skull fractures. Partial opacification of paranasal sinuses. Mastoid air cells are not opacified. Vascular calcifications. CT MAXILLOFACIAL FINDINGS The globes and extraocular muscles appear intact and symmetrical. Mucosal thickening in the ethmoid air cells. Prominent mucosal thickening in the right maxillary antrum. Air-fluid levels in the sphenoid sinuses. There appear to be acute nondisplaced fractures of the medial and lateral wall of the right maxillary antrum with mildly depressed anterior nasal bone fractures. The orbital rims appear intact. Left maxillary antral walls appear intact. Mandibles, temporomandibular joints, zygomatic arches, and pterygoid plates appear intact. Soft tissues are unremarkable. CT  CERVICAL SPINE FINDINGS Straightening of the usual cervical lordosis. This may be due to patient positioning but ligamentous injury or muscle spasm can also have this appearance and are not excluded. No anterior subluxation. Normal alignment of the facet joints. No vertebral compression deformities. No prevertebral soft tissue swelling. Intervertebral disc space heights are preserved. C1-2 articulation appears intact. No focal bone lesion or bone destruction. Bony fluid and material demonstrated in the nasopharynx and oropharynx. This is likely inflammatory. IMPRESSION: No acute intracranial abnormalities. Chronic atrophy and small vessel ischemic changes. Nondisplaced fractures of the right maxillary antral walls and of the anterior nasal bones. Probable inflammatory mucosal thickening and fluid in the paranasal sinuses. Nonspecific straightening of usual cervical lordosis. No acute displaced fractures identified. Probable inflammatory secretions demonstrated in the nasopharynx. Electronically Signed   By: Lucienne Capers M.D.   On: 07/02/2015 05:26   Mr Brain Wo Contrast  07/12/2015  CLINICAL DATA:  Dizziness.  Diffuse weakness. EXAM: MRI HEAD WITHOUT CONTRAST TECHNIQUE: Multiplanar, multiecho pulse sequences of the brain and surrounding structures were obtained without intravenous contrast. COMPARISON:  CT head without contrast 07/02/2015. FINDINGS: Extensive areas are restricted diffusion involves the medial left occipital lobe and parietal lobe as well as the left side of the splenium of the corpus callosum. There scattered areas of restricted diffusion within the left thalamus. Findings are compatible with acute/ subacute nonhemorrhagic left PCA distribution infarct. T2 and FLAIR signal changes suggest a subacute time frame. No acute hemorrhage is associated. There is no mass lesion. Remote lacunar infarcts are present in the left basal ganglia and coronal radiata. Moderate periventricular T2 changes are  advanced for age. There is moderate generalized atrophy. The ventricles are proportionate to the degree of atrophy. Mild white matter changes extend into the brainstem. The cerebellum is unremarkable. The internal auditory canals are within normal limits bilaterally. Flow is present in the major intracranial arteries. Bilateral lens replacements are present. The globes and orbits are otherwise unremarkable. Mild mucosal thickening is evident in the right maxillary sinus. The remaining paranasal sinuses and the mastoid air cells are clear. The skullbase is within normal limits. Midline sagittal images are unremarkable. IMPRESSION: 1. Acute/subacute left PCA territory infarct 2. Moderate atrophy and white matter disease likely reflects the sequela of chronic microvascular ischemia. 3. Remote lacunar infarcts involving the left lentiform nucleus and corona radiata. 4. Minimal right maxillary sinus disease. These results were called by telephone at the time of interpretation on 07/12/2015 at 8:22 pm to Dr. Janee Morn , who verbally acknowledged these results. Electronically Signed   By: San Morelle M.D.   On: 07/12/2015 20:41   Ct Maxillofacial Wo Cm  07/02/2015  CLINICAL DATA:  Trip and fall injury. No loss of consciousness. Bruising and bleeding over the nose. EXAM: CT HEAD WITHOUT CONTRAST CT MAXILLOFACIAL WITHOUT CONTRAST CT CERVICAL SPINE WITHOUT CONTRAST  TECHNIQUE: Multidetector CT imaging of the head, cervical spine, and maxillofacial structures were performed using the standard protocol without intravenous contrast. Multiplanar CT image reconstructions of the cervical spine and maxillofacial structures were also generated. COMPARISON:  CT head 08/07/2014 FINDINGS: CT HEAD FINDINGS Diffuse cerebral atrophy. Ventricular dilatation consistent with central atrophy. Patchy low-attenuation changes in the deep white matter consistent with small vessel ischemia. Old white matter infarct in the left deep  white matter. No mass effect or midline shift. No abnormal extra-axial fluid collections. Gray-white matter junctions are distinct. Basal cisterns are not effaced. No evidence of acute intracranial hemorrhage. No depressed skull fractures. Partial opacification of paranasal sinuses. Mastoid air cells are not opacified. Vascular calcifications. CT MAXILLOFACIAL FINDINGS The globes and extraocular muscles appear intact and symmetrical. Mucosal thickening in the ethmoid air cells. Prominent mucosal thickening in the right maxillary antrum. Air-fluid levels in the sphenoid sinuses. There appear to be acute nondisplaced fractures of the medial and lateral wall of the right maxillary antrum with mildly depressed anterior nasal bone fractures. The orbital rims appear intact. Left maxillary antral walls appear intact. Mandibles, temporomandibular joints, zygomatic arches, and pterygoid plates appear intact. Soft tissues are unremarkable. CT CERVICAL SPINE FINDINGS Straightening of the usual cervical lordosis. This may be due to patient positioning but ligamentous injury or muscle spasm can also have this appearance and are not excluded. No anterior subluxation. Normal alignment of the facet joints. No vertebral compression deformities. No prevertebral soft tissue swelling. Intervertebral disc space heights are preserved. C1-2 articulation appears intact. No focal bone lesion or bone destruction. Bony fluid and material demonstrated in the nasopharynx and oropharynx. This is likely inflammatory. IMPRESSION: No acute intracranial abnormalities. Chronic atrophy and small vessel ischemic changes. Nondisplaced fractures of the right maxillary antral walls and of the anterior nasal bones. Probable inflammatory mucosal thickening and fluid in the paranasal sinuses. Nonspecific straightening of usual cervical lordosis. No acute displaced fractures identified. Probable inflammatory secretions demonstrated in the nasopharynx.  Electronically Signed   By: Lucienne Capers M.D.   On: 07/02/2015 05:26     Scheduled Meds: . aspirin EC  81 mg Oral Daily  . atorvastatin  20 mg Oral QHS  . clopidogrel  75 mg Oral Daily  . docusate sodium  100 mg Oral Q12H  . famotidine  20 mg Oral QHS  . feeding supplement (ENSURE ENLIVE)  237 mL Oral TID BM  . ferrous sulfate  325 mg Oral Daily  . folic acid  1 mg Oral Daily  . gabapentin  100 mg Oral TID  . insulin aspart  0-15 Units Subcutaneous TID WC  . insulin aspart  0-5 Units Subcutaneous QHS  . metoCLOPramide  10 mg Oral TID AC & HS  . metoprolol tartrate  12.5 mg Oral BID  . midodrine  10 mg Oral TID WC  . sertraline  75 mg Oral Daily  . sodium chloride flush  3 mL Intravenous Q12H   Continuous Infusions:   Time spent: 20 minutes    Elwin Mocha, MD [facility] Pager [phone]  If 7PM-7AM, please contact night-coverage [email or web address] Password [facility] 07/16/2015, 5:46 PM

## 2015-07-17 ENCOUNTER — Encounter (HOSPITAL_COMMUNITY)
Admission: EM | Disposition: A | Discharge status: Home / Self Care | Payer: Self-pay | Source: Home / Self Care | Attending: Internal Medicine

## 2015-07-17 ENCOUNTER — Inpatient Hospital Stay (HOSPITAL_COMMUNITY): Payer: Medicare Other

## 2015-07-17 ENCOUNTER — Ambulatory Visit (HOSPITAL_COMMUNITY): Admit: 2015-07-17 | Payer: Self-pay | Admitting: Internal Medicine

## 2015-07-17 ENCOUNTER — Other Ambulatory Visit: Payer: Self-pay | Admitting: *Deleted

## 2015-07-17 ENCOUNTER — Encounter (HOSPITAL_COMMUNITY): Payer: Self-pay

## 2015-07-17 DIAGNOSIS — E1140 Type 2 diabetes mellitus with diabetic neuropathy, unspecified: Secondary | ICD-10-CM

## 2015-07-17 DIAGNOSIS — I639 Cerebral infarction, unspecified: Secondary | ICD-10-CM

## 2015-07-17 DIAGNOSIS — I63532 Cerebral infarction due to unspecified occlusion or stenosis of left posterior cerebral artery: Secondary | ICD-10-CM

## 2015-07-17 DIAGNOSIS — E1165 Type 2 diabetes mellitus with hyperglycemia: Secondary | ICD-10-CM

## 2015-07-17 DIAGNOSIS — I340 Nonrheumatic mitral (valve) insufficiency: Secondary | ICD-10-CM

## 2015-07-17 HISTORY — PX: EP IMPLANTABLE DEVICE: SHX172B

## 2015-07-17 HISTORY — PX: TEE WITHOUT CARDIOVERSION: SHX5443

## 2015-07-17 LAB — GLUCOSE, CAPILLARY
GLUCOSE-CAPILLARY: 142 mg/dL — AB (ref 65–99)
GLUCOSE-CAPILLARY: 201 mg/dL — AB (ref 65–99)
Glucose-Capillary: 124 mg/dL — ABNORMAL HIGH (ref 65–99)
Glucose-Capillary: 137 mg/dL — ABNORMAL HIGH (ref 65–99)

## 2015-07-17 SURGERY — LOOP RECORDER INSERTION
Anesthesia: LOCAL

## 2015-07-17 SURGERY — ECHOCARDIOGRAM, TRANSESOPHAGEAL
Anesthesia: Moderate Sedation

## 2015-07-17 MED ORDER — MIDAZOLAM HCL 10 MG/2ML IJ SOLN
INTRAMUSCULAR | Status: DC | PRN
  Administered 2015-07-17: 2 mg via INTRAVENOUS

## 2015-07-17 MED ORDER — ATORVASTATIN CALCIUM 40 MG PO TABS
40.0000 mg | ORAL_TABLET | Freq: Every day | ORAL | Status: DC
  Administered 2015-07-17: 40 mg via ORAL
  Filled 2015-07-17: qty 1

## 2015-07-17 MED ORDER — SODIUM CHLORIDE 0.9 % IV SOLN: INTRAVENOUS | Status: DC

## 2015-07-17 MED ORDER — LIDOCAINE-EPINEPHRINE 1 %-1:100000 IJ SOLN
INTRAMUSCULAR | Status: AC
  Filled 2015-07-17: qty 1

## 2015-07-17 MED ORDER — FENTANYL CITRATE (PF) 100 MCG/2ML IJ SOLN
INTRAMUSCULAR | Status: AC
  Filled 2015-07-17: qty 2

## 2015-07-17 MED ORDER — LIDOCAINE-EPINEPHRINE 1 %-1:100000 IJ SOLN
INTRAMUSCULAR | Status: DC | PRN
  Administered 2015-07-17: 10 mL via INTRADERMAL

## 2015-07-17 MED ORDER — METOCLOPRAMIDE HCL 10 MG PO TABS: 10.0000 mg | ORAL_TABLET | Freq: Four times a day (QID) | ORAL | Status: DC | PRN

## 2015-07-17 MED ORDER — MIDAZOLAM HCL 5 MG/ML IJ SOLN
INTRAMUSCULAR | Status: AC
  Filled 2015-07-17: qty 2

## 2015-07-17 MED ORDER — FENTANYL CITRATE (PF) 100 MCG/2ML IJ SOLN
INTRAMUSCULAR | Status: DC | PRN
  Administered 2015-07-17: 25 ug via INTRAVENOUS

## 2015-07-17 MED ORDER — LIDOCAINE VISCOUS 2 % MT SOLN
OROMUCOSAL | Status: DC | PRN
  Administered 2015-07-17: 1 via OROMUCOSAL

## 2015-07-17 MED ORDER — ATORVASTATIN CALCIUM 40 MG PO TABS: 40.0000 mg | ORAL_TABLET | Freq: Every day | ORAL | Status: DC

## 2015-07-17 MED ORDER — LIDOCAINE VISCOUS 2 % MT SOLN
OROMUCOSAL | Status: AC
  Filled 2015-07-17: qty 15

## 2015-07-17 SURGICAL SUPPLY — 2 items
LOOP REVEAL LINQSYS (Prosthesis & Implant Heart) ×1 IMPLANT
PACK LOOP INSERTION (CUSTOM PROCEDURE TRAY) ×2 IMPLANT

## 2015-07-17 NOTE — Progress Notes (Signed)
  Echocardiogram Echocardiogram Transesophageal has been performed.  Maria Fox 07/17/2015, 4:33 PM

## 2015-07-17 NOTE — Progress Notes (Signed)
PT Cancellation Note  Patient Details Name: Maria Fox MRN: [id number] DOB: May 27, 1945   Cancelled Treatment:    Reason Eval/Treat Not Completed: Patient at procedure or test/unavailable. Pt off floor at TEE. PT to return as able.   Kingsley Callander 07/17/2015, 2:48 PM   Kittie Plater, PT, DPT Pager #: [phone] Office #: [phone]

## 2015-07-17 NOTE — H&P (View-Only) (Signed)
STROKE TEAM PROGRESS NOTE   HISTORY OF PRESENT ILLNESS Maria Fox is an 70 y.o. female brought to hospital due to not feeling well. While in hospital MRI obtained and showed a left PCA infarct. Patient had a fall on the 22 of April which resulted in epistaxis. Went home, and was fine until she awoke on the 29th with dizziness and general weakness and transient blurred vision. Husband noted he does all the cleaning, cooking, driving, billing and she is not involved in a lot. He has noted a decline in her memory since her CABG.she was last known well 4.29.2017, unable to determine time. Modified Rankin: Rankin Score=1. Patient was not administered IV t-PA secondary to being out the of window. She was admitted  for further evaluation and treatment.   SUBJECTIVE (INTERVAL HISTORY) Her life-long female partner  is at the bedside. He reports she is better today.. TEE and loop recorder implant planned for Monday . Transthoracic echo is yet  pending.   OBJECTIVE Temp:  [97.5 F (36.4 C)-98.2 F (36.8 C)] 98.1 F (36.7 C) (05/07 0955) Pulse Rate:  [62-65] 64 (05/07 0955) Cardiac Rhythm:  [-] Sinus bradycardia (05/07 0700) Resp:  [16-18] 18 (05/07 0955) BP: (131-154)/(50-66) 131/50 mmHg (05/07 0955) SpO2:  [95 %-98 %] 97 % (05/07 0955)  CBC:   Recent Labs Lab 07/14/15 0223 07/16/15 0812  WBC 6.8 7.0  NEUTROABS  --  3.9  HGB 9.1* 10.2*  HCT 28.6* 31.8*  MCV 89.9 90.3  PLT 388 [id number]    Basic Metabolic Panel:   Recent Labs Lab 07/14/15 0223 07/16/15 0812  NA 141 140  K 4.1 4.8  CL 106 106  CO2 24 22  GLUCOSE 101* 139*  BUN 15 16  CREATININE 1.30* 1.46*  CALCIUM 8.9 9.6    Lipid Panel:     Component Value Date/Time   CHOL 162 07/09/2015 0437   TRIG 183* 07/09/2015 0437   HDL 30* 07/09/2015 0437   CHOLHDL 5.4 07/09/2015 0437   VLDL 37 07/09/2015 0437   LDLCALC 95 07/09/2015 0437   HgbA1c:  Lab Results  Component Value Date   HGBA1C 4.9 07/09/2015   Urine Drug Screen:      Component Value Date/Time   LABOPIA POSITIVE* 07/09/2015 0132   COCAINSCRNUR NONE DETECTED 07/09/2015 0132   LABBENZ NONE DETECTED 07/09/2015 0132   AMPHETMU NONE DETECTED 07/09/2015 0132   THCU NONE DETECTED 07/09/2015 0132   LABBARB NONE DETECTED 07/09/2015 0132      IMAGING  No results found.  PHYSICAL EXAM Pleasant elderly Caucasian lady currently not in distress. . Afebrile. Head is nontraumatic. Neck is supple without bruit.    Cardiac exam no murmur or gallop. Lungs are clear to auscultation. Distal pulses are well felt. Neurological Exam : Awake alert oriented 3 with normal speech and language function. No word finding difficulties or paraphasic errors.Slightly diminished attention  Follows two-step commands.and recall.Extraocular moments are full range without nystagmus. Dense right homonymous hemianopsia.  Pupils equal reactive. Fundi were not visualized. Face is symmetric without weakness. Tongue is midline. Motor system exam reveals no upper or lower extremity drift. Symmetric and equal strength in all 4 extremities. No focal weakness. Deep tendon reflexes are 1+ symmetric. Plantars are downgoing. Sensation is preserved bilaterally. Gait was not tested. ASSESSMENT/PLAN Maria Fox is a 70 y.o. female with history of coronary artery disease, diabetes, suffered a fall about a week ago with epistaxis presenting this admission with dizziness and transient blurred vision.  She did not receive IV t-PA due to unknown last not well.   Stroke:  Left PCA territory infarct embolic secondary to unknown source  MRI  Acute/subacute L PCA territory, small vessel disease, Old left lentiform nucleus and coronal radiata infarct.  Check TCD Carotid Doppler  There is no obvious evidence of hemodynamically significant internal carotid artery stenosis bilaterally. Vertebral arteries  are patent with antegrade flow.  2D Echo  pending   Needs TEE to look for embolic source, unable to  do TEEs over the weekend. If she remains hospitalized, would schedule for Monday. Otherwise can do as an outpatient.   Recommend cardiology evaluate for possible Implantable loop recorder placement in the event TEE is negative to look for atrial fibrillation as source of embolic stroke.  LDL 95  HgbA1c 4.9  sCDs for VTE prophylaxis Diet regular Room service appropriate?: Yes; Fluid consistency:: Thin Diet - low sodium heart healthy  aspirin 81 mg daily and clopidogrel 75 mg daily prior to admission, now on aspirin 81 mg daily and clopidogrel 75 mg daily  Patient counseled to be compliant with her antithrombotic medications  Ongoing aggressive stroke risk factor management  Therapy recommendations:  Home health PT  Disposition:  Return home with significant other  Hypertension Orthostatic hypertension  Stable  Permissive hypertension (OK if < 220/120) but gradually normalize in 5-7 days  Hyperlipidemia  Home meds:  Lipitor 20, resumed in hospital  LDL 95, goal < 70  Continue statin at discharge  Diabetes  HgbA1c 4.9, at goal < 7.0  Other Stroke Risk Factors  Advanced age  Hx stroke/TIA  07/2014 bilateral basal ganglia infarcts - incidental strokes related to her recent cardiac surgery or not and presenting symptoms are unrelated. And likely related to orthostatic hypotension  Coronary artery disease s/p CABG  Migraines  Diastolic CHF  Hx Carotid disease   Other Active Problems  Baseline cognitive deficits, progressive worsening since CABG 1 year ago  Acute kidney injury  Recurrent falls  Vitiligo  Iron deficiency anemia  Hypoxia  Hospital day # 3   I have personally examined this patient, reviewed notes, independently viewed imaging studies, participated in medical decision making and plan of care. I have made any additions or clarifications directly to the above note. Continue aspirin and Plavix.TEE and loop recorder on Monday.Greater than 50%  time during this 15 minute visit was spent on counseling and coordination of care, stroke risk and prevention and cognitive impairment.  Antony Contras, MD Medical Director [place] Pager: [phone] 07/16/2015 12:01 PM   To contact Stroke Continuity provider, please refer to [email or web address] After hours, contact General Neurology

## 2015-07-17 NOTE — Interval H&P Note (Signed)
History and Physical Interval Note:  07/17/2015 9:03 AM  Maria Fox  has presented today for surgery, with the diagnosis of stroke  The various methods of treatment have been discussed with the patient and family. After consideration of risks, benefits and other options for treatment, the patient has consented to  Procedure(s): TRANSESOPHAGEAL ECHOCARDIOGRAM (TEE) (N/A) as a surgical intervention .  The patient's history has been reviewed, patient examined, no change in status, stable for surgery.  I have reviewed the patient's chart and labs.  Questions were answered to the patient's satisfaction.     [name]

## 2015-07-17 NOTE — Discharge Instructions (Signed)
Orthostatic Hypotension Orthostatic hypotension is a sudden drop in blood pressure. It happens when you quickly stand up from a seated or lying position. You may feel dizzy or light-headed. This can last for just a few seconds or for up to a few minutes. It is usually not a serious problem. However, if this happens frequently or gets worse, it can be a sign of something more serious. CAUSES  Different things can cause orthostatic hypotension, including:   Loss of body fluids (dehydration).  Medicines that lower blood pressure.  Sudden changes in posture, such as standing up quickly after you have been sitting or lying down.  Taking too much of your medicine. SIGNS AND SYMPTOMS   Light-headedness or dizziness.   Fainting or near-fainting.   A fast heart rate.   Weakness.   Feeling tired (fatigue).  DIAGNOSIS  Your health care provider may do several things to help diagnose your condition and identify the cause. These may include:   Taking a medical history and doing a physical exam.  Checking your blood pressure. Your health care provider will check your blood pressure when you are:  Lying down.  Sitting.  Standing.  Using tilt table testing. In this test, you lie down on a table that moves from a lying position to a standing position. You will be strapped onto the table. This test monitors your blood pressure and heart rate when you are in different positions. TREATMENT  Treatment will vary depending on the cause. Possible treatments include:   Changing the dosage of your medicines.  Wearing compression stockings on your lower legs.  Standing up slowly after sitting or lying down.  Eating more salt.  Eating frequent, small meals.  In some cases, getting IV fluids.  Taking medicine to enhance fluid retention. HOME CARE INSTRUCTIONS  Only take over-the-counter or prescription medicines as directed by your health care provider.  Follow your health care  provider's instructions for changing the dosage of your current medicines.  Do not stop or adjust your medicine on your own.  Stand up slowly after sitting or lying down. This allows your body to adjust to the different position.  Wear compression stockings as directed.  Eat extra salt as directed.  Do not add extra salt to your diet unless directed to by your health care provider.  Eat frequent, small meals.  Avoid standing suddenly after eating.  Avoid hot showers or excessive heat as directed by your health care provider.  Keep all follow-up appointments. SEEK MEDICAL CARE IF:  You continue to feel dizzy or light-headed after standing.  You feel groggy or confused.  You feel cold, clammy, or sick to your stomach (nauseous).  You have blurred vision.  You feel short of breath. SEEK IMMEDIATE MEDICAL CARE IF:   You faint after standing.  You have chest pain.  You have difficulty breathing.   You lose feeling or movement in your arms or legs.   You have slurred speech or difficulty talking, or you are unable to talk.  MAKE SURE YOU:   Understand these instructions.  Will watch your condition.  Will get help right away if you are not doing well or get worse.   This information is not intended to replace advice given to you by your health care provider. Make sure you discuss any questions you have with your health care provider.   Document Released: 02/15/2002 Document Revised: 03/02/2013 Document Reviewed: 12/18/2012 Elsevier Interactive Patient Education 2016 [employer]   WOUND  CARE INSTRUCTIONS (heart monitor) Do not get the wound wet for three (3) days Do not put creams, salves, or ointments on the wound

## 2015-07-17 NOTE — Care Management Note (Signed)
Case Management Note  Patient Details  Name: Maria Fox MRN: [id number] Date of Birth: 05/26/1945  Subjective/Objective:                    Action/Plan: Patient was admitted with CVA. Lives at home with significant other. Will follow for discharge needs pending PT/OT evals and physician orders.  Expected Discharge Date:  07/11/15               Expected Discharge Plan:     In-House Referral:     Discharge planning Services     Post Acute Care Choice:    Choice offered to:     DME Arranged:    DME Agency:     HH Arranged:    HH Agency:     Status of Service:  In process, will continue to follow  Medicare Important Message Given:    Date Medicare IM Given:    Medicare IM give by:    Date Additional Medicare IM Given:    Additional Medicare Important Message give by:     If discussed at [place] of Stay Meetings, dates discussed:    Additional CommentsRolm Baptise, RN 07/17/2015, 2:04 PM [phone]

## 2015-07-17 NOTE — Progress Notes (Signed)
Patient returned back to room from TEE and Loop placement. Family at bedside. VSS. Diet order. Pt denied any distress. No other distress noted. Will continue to monitor.   Ave Filter, RN

## 2015-07-17 NOTE — Progress Notes (Signed)
Patient groggy after her procedures this afternoon and husband not present.  Will discharge tomorrow morning.

## 2015-07-17 NOTE — Progress Notes (Signed)
TEE information provided. Consent signed and in chart. Patient has been NPO since MN.   Ave Filter, RN

## 2015-07-17 NOTE — Progress Notes (Signed)
STROKE TEAM PROGRESS NOTE   SUBJECTIVE (INTERVAL HISTORY) Her life-long female partner  is at the bedside. She is scheduled for a TEE today at 4p. She reports she feels pretty good. She thinks it is 6. Pending TEE.   OBJECTIVE Temp:  [97.7 F (36.5 C)-98.5 F (36.9 C)] 98 F (36.7 C) (05/08 0940) Pulse Rate:  [55-65] 65 (05/08 0940) Cardiac Rhythm:  [-] Sinus bradycardia (05/07 2023) Resp:  [17-18] 18 (05/08 0940) BP: (113-164)/(52-72) 132/53 mmHg (05/08 0940) SpO2:  [95 %-97 %] 95 % (05/08 0940)  CBC:   Recent Labs Lab 07/14/15 0223 07/16/15 0812  WBC 6.8 7.0  NEUTROABS  --  3.9  HGB 9.1* 10.2*  HCT 28.6* 31.8*  MCV 89.9 90.3  PLT 388 Q000111Q    Basic Metabolic Panel:   Recent Labs Lab 07/14/15 0223 07/16/15 0812  NA 141 140  K 4.1 4.8  CL 106 106  CO2 24 22  GLUCOSE 101* 139*  BUN 15 16  CREATININE 1.30* 1.46*  CALCIUM 8.9 9.6    Lipid Panel:     Component Value Date/Time   CHOL 162 07/09/2015 0437   TRIG 183* 07/09/2015 0437   HDL 30* 07/09/2015 0437   CHOLHDL 5.4 07/09/2015 0437   VLDL 37 07/09/2015 0437   LDLCALC 95 07/09/2015 0437   HgbA1c:  Lab Results  Component Value Date   HGBA1C 4.9 07/09/2015   Urine Drug Screen:     Component Value Date/Time   LABOPIA POSITIVE* 07/09/2015 0132   COCAINSCRNUR NONE DETECTED 07/09/2015 0132   LABBENZ NONE DETECTED 07/09/2015 0132   AMPHETMU NONE DETECTED 07/09/2015 0132   THCU NONE DETECTED 07/09/2015 0132   LABBARB NONE DETECTED 07/09/2015 0132      IMAGING  Dg Chest 2 View  07/09/2015  IMPRESSION: Subsegmental atelectasis at the left base.   Ct Head Wo Contrast  07/02/2015  IMPRESSION: No acute intracranial abnormalities. Chronic atrophy and small vessel ischemic changes. Nondisplaced fractures of the right maxillary antral walls and of the anterior nasal bones. Probable inflammatory mucosal thickening and fluid in the paranasal sinuses. Nonspecific straightening of usual cervical lordosis. No  acute displaced fractures identified. Probable inflammatory secretions demonstrated in the nasopharynx.   Mr Brain Wo Contrast  07/12/2015  IMPRESSION: 1. Acute/subacute left PCA territory infarct 2. Moderate atrophy and white matter disease likely reflects the sequela of chronic microvascular ischemia. 3. Remote lacunar infarcts involving the left lentiform nucleus and corona radiata. 4. Minimal right maxillary sinus disease.   TCD - This was a normal transcranial Doppler study, with normal flow direction and velocity of all identified vessels of the anterior and posterior circulations, with no evidence of stenosis, vasospasm or occlusion. Basilar artert waveform not obtained due to technical difficulty.  CUS - There is no obvious evidence of hemodynamically significant internal carotid artery stenosis bilaterally. Vertebral arteries are patent with antegrade flow.  TTE - pending  TEE - LA, LAA without masses No PFO by color doppler or with injection of agitated saline MV normal Mild MR TV normal Trace TR AV mildly thickened. Triv AI PV normal LVEF is normal Minimal fixed plaque in the thoracic aorta   PHYSICAL EXAM Pleasant elderly Caucasian lady currently not in distress. . Afebrile. Head is nontraumatic. Neck is supple without bruit.    Cardiac exam no murmur or gallop. Lungs are clear to auscultation. Distal pulses are well felt.  Neurological Exam : Awake alert oriented to people and place, but not to time. Normal speech  and language function. No word finding difficulties or paraphasic errors.Slightly diminished attention  Follows two-step commands.and recall.Extraocular moments are full range without nystagmus. Dense right homonymous hemianopsia.  Pupils equal reactive. Fundi were not visualized. Face is symmetric without weakness. Tongue is midline. Motor system exam reveals no upper or lower extremity drift. Symmetric and equal strength in all 4 extremities. No focal  weakness. Deep tendon reflexes are 1+ symmetric. Plantars are downgoing. Sensation is preserved bilaterally. Gait was not tested.   ASSESSMENT/PLAN Ms. Maria Fox is a 70 y.o. female with history of coronary artery disease, diabetes, suffered a fall about a week ago with epistaxis presenting this admission with dizziness and transient blurred vision. She did not receive IV t-PA due to unknown last not well.   Stroke:  Left PCA territory infarct embolic secondary to unknown source  MRI  Acute/subacute L PCA territory, old left lentiform nucleus and corona radiata infarct.  TCD normal  Carotid Doppler  No significant stenosis   2D Echo  pending   TEE unremarkable   Loop recorder placed  LDL 95  HgbA1c 4.9  sCDs for VTE prophylaxis Diet - low sodium heart healthy Diet NPO time specified Except for: Sips with Meds  aspirin 81 mg daily and clopidogrel 75 mg daily prior to admission, now on aspirin 81 mg daily and clopidogrel 75 mg daily.  Patient counseled to be compliant with her antithrombotic medications  Ongoing aggressive stroke risk factor management  Therapy recommendations:  Home health PT, no OT  Disposition:  Return home with significant other  Hypertension  Stable  Permissive hypertension (OK if < 220/120) but gradually normalize in 5-7 days  Hyperlipidemia  Home meds:  Lipitor 20, resumed in hospital  LDL 95, goal < 70  Continue statin at discharge  Diabetes  HgbA1c 4.9, at goal < 7.0  Other Stroke Risk Factors  Advanced age  Hx stroke/TIA  07/2014 bilateral basal ganglia infarcts - incidental strokes related to her recent cardiac surgery or not and presenting symptoms are unrelated. And likely related to orthostatic hypotension  Coronary artery disease s/p CABG  Migraines  Diastolic CHF  Hx Carotid disease   Other Active Problems  Baseline cognitive deficits, progressive worsening since CABG 1 year ago  Acute kidney  injury  Recurrent falls  Vitiligo  Iron deficiency anemia  Hypoxia  Hospital day # 4   Neurology will sign off. Please call with questions. Pt will follow up with Dr. Erlinda Hong at [facility] in about 2 months. Thanks for the consult.  Rosalin Hawking, MD PhD Stroke Neurology 07/17/2015 9:57 PM   To contact Stroke Continuity provider, please refer to [email or web address] After hours, contact General Neurology

## 2015-07-17 NOTE — Consult Note (Signed)
ELECTROPHYSIOLOGY CONSULT NOTE  Patient ID: Maria Fox MRN: [id number], DOB/AGE: [birth year]   Admit date: 07/09/2015 Date of Consult: 07/17/2015  Primary Physician: Maria Bush, MD Primary Cardiologist: Dr. Radford Fox Reason for Consultation: Cryptogenic stroke; recommendations regarding Implantable Loop Recorder Requesting MD: Dr. Leonie Fox  History of Present Illness Maria Fox was admitted on 07/09/2015 with bgeneralized malaise, hx of recent fall prior to admit and found with acute CVA .  They first developed symptoms while at home, particularly blurred vision and generalized weakness and fatigue  Her PMHx includes CAD/CABG, HTN, HLD, prior stroke, DM, migraines.   She was noted out Jan 2016 patient to have NSVT and w/u led to her CABG, post-op symptomatic with dizziness, noted with orthostatic hypotension,. During this w/u found with stroke felt to have occurred during her CABG in April 2016.  Her last cardiology office visit noted NSVT with nml LVEF and BB adjusted.   Imaging this admit demonstrated Left PCA territory infarct embolic secondary to unknown source, MRI Acute/subacute L PCA territory, small vessel disease, Old left lentiform nucleus and coronal radiata infarct.  she has undergone workup for stroke, her echocardiogram is pending and carotid dopplers without significant stenosis.  The patient has been monitored on telemetry which has demonstrated sinus rhythm with no arrhythmias.  Inpatient stroke work-up is to be completed with a TEE this afternoon.   Echocardiogram this admission demonstrated  Is pending  Lab work is reviewed.  Prior to admission, the patient has not been having any chest pain, shortness of breath, infrequent dizziness, very rare palpitations, no syncope.  They are recovering from their stroke with plans to home at discharge.  EP has been asked to evaluate for placement of an implantable loop recorder to monitor for atrial fibrillation.     Past  Medical History  Diagnosis Date  . Hypertension   . Multiple allergies     mold, wool, dust, feathers  . Angioedema   . HLD (hyperlipidemia)   . Hx of migraines     remote  . S/P lens implant     left side (Maria Fox)  . Vitiligo   . History of chicken pox   . Dermatitis     eval Maria Fox Fox: eczema, eval Maria Fox: bx negative for lichen simplex or derm herpetiformis  . Autoimmune deficiency syndrome (Maria Fox)   . UTI (urinary tract infection) 06/2014  . CVA (cerebral infarction) 07/2014    bilateral corona radiata - periCABG  . HCAP (healthcare-associated pneumonia) 06/2014  . DM (diabetes mellitus), type 2, uncontrolled w/neurologic complication (Maria Fox) [id number]    ?autonomic neuropathy, gastroparesis (06/2014)   . Diastolic CHF (Maria Fox) 99991111  . Carotid arterial disease (Maria Fox)     <50% bilaterally  . CAD (coronary artery disease), native coronary artery 06/2014    s/p NSTEMI with cath showing severe diffuse disease of the mid LAD, occluded large diagonal, 80-90% prox OM and normal dominant RCA.   . Maxillary fracture Maria Fox)      Surgical History:  Past Surgical History  Procedure Laterality Date  . Tonsillectomy  1958  . Orif ankle fracture  1999    after Maria Fox, left leg  . Intraocular lens implant, secondary Left 2012    (Maria Fox)  . Cataract extraction Right 2015    (Maria Fox)  . Cardiovascular stress test  03/2014    nuclear - passed Maria Fox)  . Coronary artery bypass graft  06/2014    3v in Maria Fox  . Cardiac surgery  Prescriptions prior to admission  Medication Sig Dispense Refill Last Dose  . aspirin EC 81 MG tablet Take 81 mg by mouth daily.    07/08/2015 at Unknown time  . atorvastatin (LIPITOR) 20 MG tablet Take 1 tablet (20 mg total) by mouth at bedtime. 30 tablet 5 07/08/2015 at Unknown time  . cephALEXin (KEFLEX) 500 MG capsule Take 1 capsule (500 mg total) by mouth 4 (four) times daily. 28 capsule 0 07/08/2015 at Unknown time  . clopidogrel (PLAVIX) 75 MG tablet Take  1 tablet (75 mg total) by mouth daily. 90 tablet 3 07/08/2015 at Unknown time  . docusate sodium (COLACE) 100 MG capsule Take 1 capsule (100 mg total) by mouth every 12 (twelve) hours. 30 capsule 0 07/08/2015 at Unknown time  . famotidine (PEPCID) 20 MG tablet Take 1 tablet (20 mg total) by mouth at bedtime. 30 tablet 1 07/08/2015 at Unknown time  . feeding supplement, GLUCERNA SHAKE, (GLUCERNA SHAKE) LIQD Take 237 mLs by mouth 3 (three) times daily between meals. 90 Can 0 07/08/2015 at Unknown time  . ferrous sulfate 325 (65 FE) MG tablet Take 1 tablet (325 mg total) by mouth daily. 30 tablet 5 07/08/2015 at Unknown time  . folic acid (FOLVITE) 1 MG tablet Take 1 tablet (1 mg total) by mouth daily. 30 tablet 5 07/08/2015 at Unknown time  . gabapentin (NEURONTIN) 100 MG capsule TAKE ONE CAPSULE BY MOUTH THREE TIMES DAILY 90 capsule 2 07/08/2015 at Unknown time  . hydrocortisone cream 1 % Apply 1 application topically 2 (two) times daily. 30 g 0 07/08/2015 at Unknown time  . hydrOXYzine (ATARAX/VISTARIL) 10 MG tablet Take 1 tablet (10 mg total) by mouth 3 (three) times daily as needed for itching. 90 tablet 0 07/08/2015 at Unknown time  . meclizine (ANTIVERT) 25 MG tablet TAKE ONE TABLET BY MOUTH THREE TIMES DAILY AS NEEDED FOR DIZZINESS 90 tablet 0 07/08/2015 at Unknown time  . metFORMIN (GLUCOPHAGE) 500 MG tablet TAKE ONE TABLET BY MOUTH TWICE DAILY WITH  A  MEAL 60 tablet 3 07/08/2015 at Unknown time  . metoCLOPramide (REGLAN) 10 MG tablet Take 1 tablet (10 mg total) by mouth 4 (four) times daily -  before meals and at bedtime. 120 tablet 3 07/08/2015 at Unknown time  . metoprolol tartrate (LOPRESSOR) 25 MG tablet Take 0.5 tablets (12.5 mg total) by mouth 2 (two) times daily.   07/08/2015 at 1500  . midodrine (PROAMATINE) 10 MG tablet Take 1 tablet (10 mg total) by mouth 3 (three) times daily with meals. 90 tablet 0 07/08/2015 at Unknown time  . ondansetron (ZOFRAN ODT) 4 MG disintegrating tablet 4mg  ODT q8 hours  prn nausea/vomit 10 tablet 0 Past Week at Unknown time  . sertraline (ZOLOFT) 50 MG tablet Take 1.5 tablets (75 mg total) by mouth daily. 135 tablet 3 07/08/2015 at Unknown time  . TRADJENTA 5 MG TABS tablet TAKE ONE TABLET BY MOUTH DAILY 30 tablet 3 07/08/2015 at Unknown time  . traMADol (ULTRAM) 50 MG tablet TAKE ONE TABLET BY MOUTH EVERY 6 HOURS AS NEEDED (Patient taking differently: TAKE ONE TABLET BY MOUTH EVERY 6 HOURS AS NEEDED FOR PAIN) 30 tablet 0 07/08/2015 at Unknown time  . Vitamin D, Ergocalciferol, (DRISDOL) 50000 units CAPS capsule TAKE ONE CAPSULE BY MOUTH EVERY 7 DAYS. 12 capsule 0 Past Week at Unknown time  . [DISCONTINUED] HYDROcodone-acetaminophen (NORCO) 5-325 MG tablet Take 1-2 tablets by mouth every 4 (four) hours as needed. (Patient taking differently: Take 1-2 tablets  by mouth every 4 (four) hours as needed for moderate pain. ) 15 tablet 0 07/08/2015 at Unknown time    Inpatient Medications:  . aspirin EC  81 mg Oral Daily  . atorvastatin  20 mg Oral QHS  . clopidogrel  75 mg Oral Daily  . docusate sodium  100 mg Oral Q12H  . famotidine  20 mg Oral QHS  . feeding supplement (ENSURE ENLIVE)  237 mL Oral TID BM  . ferrous sulfate  325 mg Oral Daily  . folic acid  1 mg Oral Daily  . gabapentin  100 mg Oral TID  . insulin aspart  0-15 Units Subcutaneous TID WC  . insulin aspart  0-5 Units Subcutaneous QHS  . metoCLOPramide  10 mg Oral TID AC & HS  . metoprolol tartrate  12.5 mg Oral BID  . midodrine  10 mg Oral TID WC  . sertraline  75 mg Oral Daily  . sodium chloride flush  3 mL Intravenous Q12H    Allergies:  Allergies  Allergen Reactions  . Mushroom Extract Complex Anaphylaxis  . Codeine Nausea And Vomiting  . Penicillins Hives and Swelling  . Sulfa Antibiotics Nausea And Vomiting    Social History   Social History  . Marital Status: Single    Spouse Name: N/A  . Number of Children: N/A  . Years of Education: N/A   Occupational History  . mail room  [employer]   Social History Main Topics  . Smoking status: Never Smoker   . Smokeless tobacco: Never Used  . Alcohol Use: No  . Drug Use: No  . Sexual Activity: Not on file   Other Topics Concern  . Not on file   Social History Narrative   Caffeine: occasional   Lives with friend, [name], 1 cat   Occupation: Web designer, and mailer at [employer]   Edu: HS   Activity: walks daily (2-3 blocks)   Diet: good water, daily fruits/vegetables      [facility] unable to reach patient to establish care (04/2015)     Family History  Problem Relation Age of Onset  . Hypertension Brother   . Hyperlipidemia Brother   . Asthma Brother   . Cancer Father     throat  . Diabetes type II Mother   . Hypertension Mother   . Hyperlipidemia Mother   . Cancer Maternal Grandmother     cervical  . Coronary artery disease Neg Hx   . Stroke Neg Hx       Review of Systems: All other systems reviewed and are otherwise negative except as noted above.  Physical Exam: Filed Vitals:   07/16/15 1708 07/16/15 2214 07/17/15 0241 07/17/15 0600  BP: 145/64 164/72 125/52 113/59  Pulse: 56 62 55 59  Temp: 98 F (36.7 C) 97.7 F (36.5 C) 98.2 F (36.8 C) 97.8 F (36.6 C)  TempSrc: Oral Oral Oral Oral  Resp: 18 18 17 18   Height:      Weight:      SpO2: 95% 96% 96% 96%    GEN- The patient is well appearing, alert and oriented x 3 today.   Head- normocephalic, atraumatic Eyes-  Sclera clear, conjunctiva pink Ears- hearing intact Oropharynx- clear Neck- supple Lungs- Clear to ausculation bilaterally, normal work of breathing Heart- Regular rate and rhythm, no murmurs, rubs or gallops  GI- soft, NT, ND Extremities- no clubbing, cyanosis, or edema MS- no significant deformity or atrophy Skin-  no rash or lesion Psych- euthymic mood, full affect   Labs:   Lab Results  Component Value Date   WBC 7.0 07/16/2015   HGB 10.2* 07/16/2015   HCT 31.8* 07/16/2015    MCV 90.3 07/16/2015   PLT 377 07/16/2015    Recent Labs Lab 07/16/15 0812  NA 140  K 4.8  CL 106  CO2 22  BUN 16  CREATININE 1.46*  CALCIUM 9.6  GLUCOSE 139*   Lab Results  Component Value Date   CKTOTAL 60 03/27/2014   CKMB 3.2 11/30/2010   TROPONINI <0.03 07/09/2015   Lab Results  Component Value Date   CHOL 162 07/09/2015   CHOL 107 07/15/2014   CHOL 181 06/03/2012   Lab Results  Component Value Date   HDL 30* 07/09/2015   HDL 30* 07/15/2014   HDL 52 06/03/2012   Lab Results  Component Value Date   LDLCALC 95 07/09/2015   LDLCALC 48 07/15/2014   LDLCALC 113* 06/03/2012   Lab Results  Component Value Date   TRIG 183* 07/09/2015   TRIG 144 07/15/2014   TRIG 79 06/03/2012   Lab Results  Component Value Date   CHOLHDL 5.4 07/09/2015   CHOLHDL 3.6 07/15/2014   CHOLHDL 3.5 06/03/2012   Lab Results  Component Value Date   LDLDIRECT 134.9 10/21/2011    Lab Results  Component Value Date   DDIMER 2.21* 08/07/2014     Radiology/Studies:  Dg Chest 2 View 07/09/2015  CLINICAL DATA:  Hypoxia. Dizziness. Nausea and vomiting. Cough. Symptoms for 4 days. EXAM: CHEST  2 VIEW COMPARISON:  09/09/2014 FINDINGS: Normal heart size. Subsegmental atelectasis of the left base. Postoperative changes. External objects project over the upper lung zones. IMPRESSION: Subsegmental atelectasis at the left base. Electronically Signed   By: Marybelle Killings M.D.   On: 07/09/2015 08:59   Mr Brain Wo Contrast 07/12/2015  CLINICAL DATA:  Dizziness.  Diffuse weakness. EXAM: MRI HEAD WITHOUT CONTRAST TECHNIQUE: Multiplanar, multiecho pulse sequences of the brain and surrounding structures were obtained without intravenous contrast. COMPARISON:  CT head without contrast 07/02/2015. FINDINGS: Extensive areas are restricted diffusion involves the medial left occipital lobe and parietal lobe as well as the left side of the splenium of the corpus callosum. There scattered areas of restricted  diffusion within the left thalamus. Findings are compatible with acute/ subacute nonhemorrhagic left PCA distribution infarct. T2 and FLAIR signal changes suggest a subacute time frame. No acute hemorrhage is associated. There is no mass lesion. Remote lacunar infarcts are present in the left basal ganglia and coronal radiata. Moderate periventricular T2 changes are advanced for age. There is moderate generalized atrophy. The ventricles are proportionate to the degree of atrophy. Mild white matter changes extend into the brainstem. The cerebellum is unremarkable. The internal auditory canals are within normal limits bilaterally. Flow is present in the major intracranial arteries. Bilateral lens replacements are present. The globes and orbits are otherwise unremarkable. Mild mucosal thickening is evident in the right maxillary sinus. The remaining paranasal sinuses and the mastoid air cells are clear. The skullbase is within normal limits. Midline sagittal images are unremarkable. IMPRESSION: 1. Acute/subacute left PCA territory infarct 2. Moderate atrophy and white matter disease likely reflects the sequela of chronic microvascular ischemia. 3. Remote lacunar infarcts involving the left lentiform nucleus and corona radiata. 4. Minimal right maxillary sinus disease. These results were called by telephone at the time of interpretation on 07/12/2015 at 8:22 pm to Dr. Janee Morn , who verbally  acknowledged these results. Electronically Signed   By: San Morelle M.D.   On: 07/12/2015 20:41     12-lead ECG SR, RBBB, Q waves V1-2 All prior EKG's in EPIC reviewed with no documented atrial fibrillation  Telemetry SR, occ PVC's, infrequent couplet, 13 beat WCT 07/14/15, no AFib/flutter Historical telemetry strips show episodes of WCT/rhythm, no AF  Assessment and Plan:  1. Cryptogenic stroke The patient presents with cryptogenic stroke.  The patient has a TEE planned for this AM.  I spoke at length with the  patient about monitoring for afib with either a 30 day event monitor or an implantable loop recorder.  Risks, benefits, and alteratives to implantable loop recorder were discussed with the patient today.   At this time the patient is very clear in their decision to proceed with implantable loop recorder if recommended. Will await her echo/TEE.  Wound care was reviewed with the patient (keep incision clean and dry for 3 days), and wound check will scheduled for her should we proceed.    Renee Dyane Dustman, PA-C 07/17/2015  I have seen, examined the patient, and reviewed the above assessment and plan.  On exam, RRR.  No prior history of afib.  Recurrent ESUS.  If TEE is unrevealing, will proceed with ILR implantation.  Risks of procedure discussed with patient who wishes to proceed.  Remote monitoring also discussed.  Changes to above are made where necessary.    Co Sign: Thompson Grayer, MD 07/17/2015 12:40 PM

## 2015-07-17 NOTE — Interval H&P Note (Signed)
History and Physical Interval Note:  07/17/2015 12:42 PM  Maria Fox  has presented today for surgery, with the diagnosis of Stroke  The various methods of treatment have been discussed with the patient and family. After consideration of risks, benefits and other options for treatment, the patient has consented to  Procedure(s): Loop Recorder Insertion (N/A) as a surgical intervention .  The patient's history has been reviewed, patient examined, no change in status, stable for surgery.  I have reviewed the patient's chart and labs.  Questions were answered to the patient's satisfaction.     Thompson Grayer

## 2015-07-17 NOTE — Op Note (Signed)
LA, LAA without masses No PFO by color doppler or with injection of agitated saline MV normal  Mild MR TV normal  Trace TR AV mildly thickened.  Triv AI PV normal LVEF is normal Minimal fixed plaque in the thoracic aorta

## 2015-07-17 NOTE — Interval H&P Note (Signed)
History and Physical Interval Note:  07/17/2015 3:40 PM  Maria Fox  has presented today for surgery, with the diagnosis of stroke  The various methods of treatment have been discussed with the patient and family. After consideration of risks, benefits and other options for treatment, the patient has consented to  Procedure(s): TRANSESOPHAGEAL ECHOCARDIOGRAM (TEE) (N/A) as a surgical intervention .  The patient's history has been reviewed, patient examined, no change in status, stable for surgery.  I have reviewed the patient's chart and labs.  Questions were answered to the patient's satisfaction.     [name]

## 2015-07-17 NOTE — Consult Note (Signed)
   [facility] CM Inpatient Consult   07/17/2015  Maria Fox [birth year] [id number]   Patient evaluated for long-term disease management services with [facility] program. Martin Majestic to bedside to discuss and offer [facility] services. Patient is agreeable and written consent signed. Explained to patient that she will receive post hospital transition of care calls and will be evaluated for monthly home visits. Confirmed Primary Care MD as Dr. Leo Grosser.  Confirmed best contact number as [phone]. Explained that [facility] will not interfere or replace services provided by home health.  Left [facility] Care Management packet and contact information at bedside. Will make inpatient RNCM aware that patient will be followed by [facility] post hospital discharge.  [name] endorses she lives with her significant other, [name]. She denies having issues with affording medications or with transportation. Discussed that [place] [facility] has attempted to reach her in the past but was unsuccessful. Agreeable that [name] can be called at [phone] if [facility] is unable to reach her on her phone. [name] is also listed as emergency contact on written consent.   Will request for patient to be assigned to [facility] for history of CHF, DM, and for medication compliance.    Marthenia Rolling, MSN-Ed, RN,BSN [facility] Liaison [phone] product
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
## 2015-07-17 NOTE — H&P (View-Only) (Signed)
Patient ID: Maria Fox, female   DOB: [birth year], 70 y.o.   MRN: [id number]    PROGRESS NOTE    Maria Fox  [name] DOB: [birth year] DOA: 07/09/2015  PCP: [name], MD   Outpatient Specialists:   Brief Narrative:  70 y.o. female with CAD, status post CABG 3 Q000111Q, diastolic heart failure,diabetes, presented to [facility] ED with generalized weakness, occasional dyspnea with exertion, several days in duration. [name], [name], has received packing and antibiotics and discharged. She has been week since.   Hospital stay complicated by unsteady gait and intermittent LE weakness, MRI brain requested and was notable for L PCA territory stroke, with remote lacunar strokes raising concern of embolic stroke.   Assessment & Plan:   Principal Problem:   Falls with LE weakness / Orthostatic hypotension / new L PCA territory stroke and remote lacunar strokes - Stroke: Acute/subacute left PCA territory infarct  Remote lacunar infarcts left lentiform nucleus and corona radiata.  - Resultant - LE weakness with orthostatic hypotension, fall  - CT head 4/23 (on last admission) - No acute intracranial abnormalities, atrophy and small vessel ischemic changes. - MRI5/3 - Acute/subacute left PCA territory infarct  Remote lacunar infarcts left lentiform nucleus and corona radiata.  - Carotid Doppler -complete and normal - 2D Echo results pending, TEE scheduled for 5/8 - LDL 4/30 - 95 - HgbA1c 4/30 - 4.9 - VTE prophylaxis - SCD's - Diet - regular diet (libearlized as pt with poor oral intake)  - Antithrombotic - on Aspirin 81 mg PO QD and Plavix 75 mg PO QD at home prior to this admission - Patient counseled to be compliant with his antithrombotic medications - Ongoing aggressive stroke risk factor management - Therapy recommendations:PT - HHPT with supervision, SLP  - none and OT pending  - pt may need SNF if family cannot do supervision with mobility - Disposition: pending once stroke team evaluates, stroke order set in place     Acute kidney injury ([place]) - appears to be pre renal in etiology from poor oral intake - IVF have been provided but Cr remains 1.2 - 1.4 - diet liberalized in an effort to improve oral intake  - BMP in AM  Active Problems:   Falls - appears to be secondary to orthostatic hypotension - this is likely related to autonomic dysfunction in the setting of long standing diabetes and now new stroke  - HH PT recommended, orders placed     HYPERTENSION, BENIGN SYSTEMIC - reasonable inpatient control, will monitor - pt on metoprolol only     Vitiligo - chronic, outpatient follow up    DM (diabetes mellitus), type 2, uncontrolled w/neurologic complication and gastroparesis, autonomic dysfunction ([place]) - continue SSI for now until oral intake improves - also continue Metoclopramide  - last A1C 4/30 - 4.9    CAD (coronary artery disease), native coronary artery - on aspirin and plavix - follow up on neurology team recommendations     Diastolic CHF ([place]) - chronic, no evidence of volume overload at this time - weight is 68 kg - > 63kg today    Anemia, iron deficiency - no signs of active bleeding - slight drop in Hg likely dilutional, CBC 90    Hypoxia - remains stable with oxygen saturation at target range   DVT prophylaxis: SCD's Code Status: Full  Family Communication: Patient  and husband at bedside  Disposition Plan: Home when cleared by neurology team   Consultants:   Neurology  PT/OT/SLP  Procedures:   ECHO 5/4  Vascular carotid doppler 5/4  Transcranial Doppler-pending results  Antimicrobials:   None   Subjective: Reports states that she feels stronger.  Patient denies any headache nausea vomiting double vision blurred vision.  Objective: Filed Vitals:   07/16/15 0535 07/16/15 0955  07/16/15 1310 07/16/15 1708  BP:  131/50 146/65 145/64  Pulse: 65 64 59 56  Temp: 97.5 F (36.4 C) 98.1 F (36.7 C) 98.5 F (36.9 C) 98 F (36.7 C)  TempSrc: Oral Oral Oral Oral  Resp: 18 18 18 18   Height:      Weight:      SpO2: 96% 97% 97% 95%   No intake or output data in the 24 hours ending 07/16/15 1746 Filed Weights   07/09/15 1739 07/14/15 2048 07/15/15 0516  Weight: 68.153 kg (150 lb 4 oz) 63.776 kg (140 lb 9.6 oz) 63.957 kg (141 lb)    Examination:  General exam: Appears calm and comfortable  Respiratory system: Clear to auscultation. Respiratory effort normal. Cardiovascular system: S1 & S2 heard, RRR. No JVD, murmurs, rubs, gallops or clicks. No pedal edema. Gastrointestinal system: Abdomen is nondistended, soft and nontender Central nervous system: Alert and oriented. No focal neurological deficits. Extremities: CN II - XII intact, strength symmetric in upper extremities 5/5 And lower extremities. Abn FNF on Left. Skin: diffuse vitiligo   Data Reviewed: I have personally reviewed following labs and imaging studies  CBC:  Recent Labs Lab 07/11/15 0401 07/12/15 0255 07/13/15 0258 07/14/15 0223 07/16/15 0812  WBC 6.7 8.0 7.5 6.8 7.0  NEUTROABS  --   --   --   --  3.9  HGB 8.4* 9.2* 9.3* 9.1* 10.2*  HCT 25.3* 27.9* 28.0* 28.6* 31.8*  MCV 87.5 89.7 90.0 89.9 90.3  PLT 303 367 379 388 [id number]   Basic Metabolic Panel:  Recent Labs Lab 07/11/15 0401 07/12/15 0255 07/13/15 0258 07/14/15 0223 07/16/15 0812  NA 138 138 138 141 140  K 4.1 3.9 3.9 4.1 4.8  CL 108 107 106 106 106  CO2 23 21* 22 24 22   GLUCOSE 135* 92 137* 101* 139*  BUN 12 14 15 15 16   CREATININE 1.22* 1.18* 1.31* 1.30* 1.46*  CALCIUM 8.8* 8.9 8.7* 8.9 9.6   Cardiac Enzymes: No results for input(s): CKTOTAL, CKMB, CKMBINDEX, TROPONINI in the last 168 hours. CBG:  Recent Labs Lab 07/15/15 1616 07/15/15 2302 07/16/15 0654 07/16/15 1112 07/16/15 1622  GLUCAP 73 102* 130* 200* 109*     Urine analysis:    Component Value Date/Time   COLORURINE AMBER* 07/09/2015 0132   APPEARANCEUR CLOUDY* 07/09/2015 0132   LABSPEC 1.027 07/09/2015 0132   PHURINE 5.0 07/09/2015 0132   GLUCOSEU 100* 07/09/2015 0132   HGBUR NEGATIVE 07/09/2015 0132   BILIRUBINUR SMALL* 07/09/2015 0132   KETONESUR 15* 07/09/2015 0132   PROTEINUR 30* 07/09/2015 0132   UROBILINOGEN 0.2 09/16/2014 0009   NITRITE NEGATIVE 07/09/2015 0132   LEUKOCYTESUR SMALL* 07/09/2015 0132    Recent Results (from the past 240 hour(s))  Culture, Urine     Status: Abnormal   Collection Time: 07/09/15  9:32 AM  Result Value Ref Range Status   Specimen Description URINE, CLEAN CATCH  Final   Special Requests NONE  Final   Culture MULTIPLE SPECIES PRESENT, SUGGEST RECOLLECTION (A)  Final   Report Status 07/11/2015 FINAL  Final  Radiology Studies: Dg Chest 2 View  07/09/2015  CLINICAL DATA:  Hypoxia. Dizziness. Nausea and vomiting. Cough. Symptoms for 4 days. EXAM: CHEST  2 VIEW COMPARISON:  09/09/2014 FINDINGS: Normal heart size. Subsegmental atelectasis of the left base. Postoperative changes. External objects project over the upper lung zones. IMPRESSION: Subsegmental atelectasis at the left base. Electronically Signed   By: Marybelle Killings M.D.   On: 07/09/2015 08:59   Ct Head Wo Contrast  07/02/2015  CLINICAL DATA:  Trip and fall injury. No loss of consciousness. Bruising and bleeding over the nose. EXAM: CT HEAD WITHOUT CONTRAST CT MAXILLOFACIAL WITHOUT CONTRAST CT CERVICAL SPINE WITHOUT CONTRAST TECHNIQUE: Multidetector CT imaging of the head, cervical spine, and maxillofacial structures were performed using the standard protocol without intravenous contrast. Multiplanar CT image reconstructions of the cervical spine and maxillofacial structures were also generated. COMPARISON:  CT head 08/07/2014 FINDINGS: CT HEAD FINDINGS Diffuse cerebral atrophy. Ventricular dilatation consistent with central atrophy. Patchy  low-attenuation changes in the deep white matter consistent with small vessel ischemia. Old white matter infarct in the left deep white matter. No mass effect or midline shift. No abnormal extra-axial fluid collections. Gray-white matter junctions are distinct. Basal cisterns are not effaced. No evidence of acute intracranial hemorrhage. No depressed skull fractures. Partial opacification of paranasal sinuses. Mastoid air cells are not opacified. Vascular calcifications. CT MAXILLOFACIAL FINDINGS The globes and extraocular muscles appear intact and symmetrical. Mucosal thickening in the ethmoid air cells. Prominent mucosal thickening in the right maxillary antrum. Air-fluid levels in the sphenoid sinuses. There appear to be acute nondisplaced fractures of the medial and lateral wall of the right maxillary antrum with mildly depressed anterior nasal bone fractures. The orbital rims appear intact. Left maxillary antral walls appear intact. Mandibles, temporomandibular joints, zygomatic arches, and pterygoid plates appear intact. Soft tissues are unremarkable. CT CERVICAL SPINE FINDINGS Straightening of the usual cervical lordosis. This may be due to patient positioning but ligamentous injury or muscle spasm can also have this appearance and are not excluded. No anterior subluxation. Normal alignment of the facet joints. No vertebral compression deformities. No prevertebral soft tissue swelling. Intervertebral disc space heights are preserved. C1-2 articulation appears intact. No focal bone lesion or bone destruction. Bony fluid and material demonstrated in the nasopharynx and oropharynx. This is likely inflammatory. IMPRESSION: No acute intracranial abnormalities. Chronic atrophy and small vessel ischemic changes. Nondisplaced fractures of the right maxillary antral walls and of the anterior nasal bones. Probable inflammatory mucosal thickening and fluid in the paranasal sinuses. Nonspecific straightening of usual  cervical lordosis. No acute displaced fractures identified. Probable inflammatory secretions demonstrated in the nasopharynx. Electronically Signed   By: Lucienne Capers M.D.   On: 07/02/2015 05:26   Ct Cervical Spine Wo Contrast  07/02/2015  CLINICAL DATA:  Trip and fall injury. No loss of consciousness. Bruising and bleeding over the nose. EXAM: CT HEAD WITHOUT CONTRAST CT MAXILLOFACIAL WITHOUT CONTRAST CT CERVICAL SPINE WITHOUT CONTRAST TECHNIQUE: Multidetector CT imaging of the head, cervical spine, and maxillofacial structures were performed using the standard protocol without intravenous contrast. Multiplanar CT image reconstructions of the cervical spine and maxillofacial structures were also generated. COMPARISON:  CT head 08/07/2014 FINDINGS: CT HEAD FINDINGS Diffuse cerebral atrophy. Ventricular dilatation consistent with central atrophy. Patchy low-attenuation changes in the deep white matter consistent with small vessel ischemia. Old white matter infarct in the left deep white matter. No mass effect or midline shift. No abnormal extra-axial fluid collections. Gray-white matter junctions are distinct. Basal cisterns  are not effaced. No evidence of acute intracranial hemorrhage. No depressed skull fractures. Partial opacification of paranasal sinuses. Mastoid air cells are not opacified. Vascular calcifications. CT MAXILLOFACIAL FINDINGS The globes and extraocular muscles appear intact and symmetrical. Mucosal thickening in the ethmoid air cells. Prominent mucosal thickening in the right maxillary antrum. Air-fluid levels in the sphenoid sinuses. There appear to be acute nondisplaced fractures of the medial and lateral wall of the right maxillary antrum with mildly depressed anterior nasal bone fractures. The orbital rims appear intact. Left maxillary antral walls appear intact. Mandibles, temporomandibular joints, zygomatic arches, and pterygoid plates appear intact. Soft tissues are unremarkable. CT  CERVICAL SPINE FINDINGS Straightening of the usual cervical lordosis. This may be due to patient positioning but ligamentous injury or muscle spasm can also have this appearance and are not excluded. No anterior subluxation. Normal alignment of the facet joints. No vertebral compression deformities. No prevertebral soft tissue swelling. Intervertebral disc space heights are preserved. C1-2 articulation appears intact. No focal bone lesion or bone destruction. Bony fluid and material demonstrated in the nasopharynx and oropharynx. This is likely inflammatory. IMPRESSION: No acute intracranial abnormalities. Chronic atrophy and small vessel ischemic changes. Nondisplaced fractures of the right maxillary antral walls and of the anterior nasal bones. Probable inflammatory mucosal thickening and fluid in the paranasal sinuses. Nonspecific straightening of usual cervical lordosis. No acute displaced fractures identified. Probable inflammatory secretions demonstrated in the nasopharynx. Electronically Signed   By: Lucienne Capers M.D.   On: 07/02/2015 05:26   Mr Brain Wo Contrast  07/12/2015  CLINICAL DATA:  Dizziness.  Diffuse weakness. EXAM: MRI HEAD WITHOUT CONTRAST TECHNIQUE: Multiplanar, multiecho pulse sequences of the brain and surrounding structures were obtained without intravenous contrast. COMPARISON:  CT head without contrast 07/02/2015. FINDINGS: Extensive areas are restricted diffusion involves the medial left occipital lobe and parietal lobe as well as the left side of the splenium of the corpus callosum. There scattered areas of restricted diffusion within the left thalamus. Findings are compatible with acute/ subacute nonhemorrhagic left PCA distribution infarct. T2 and FLAIR signal changes suggest a subacute time frame. No acute hemorrhage is associated. There is no mass lesion. Remote lacunar infarcts are present in the left basal ganglia and coronal radiata. Moderate periventricular T2 changes are  advanced for age. There is moderate generalized atrophy. The ventricles are proportionate to the degree of atrophy. Mild white matter changes extend into the brainstem. The cerebellum is unremarkable. The internal auditory canals are within normal limits bilaterally. Flow is present in the major intracranial arteries. Bilateral lens replacements are present. The globes and orbits are otherwise unremarkable. Mild mucosal thickening is evident in the right maxillary sinus. The remaining paranasal sinuses and the mastoid air cells are clear. The skullbase is within normal limits. Midline sagittal images are unremarkable. IMPRESSION: 1. Acute/subacute left PCA territory infarct 2. Moderate atrophy and white matter disease likely reflects the sequela of chronic microvascular ischemia. 3. Remote lacunar infarcts involving the left lentiform nucleus and corona radiata. 4. Minimal right maxillary sinus disease. These results were called by telephone at the time of interpretation on 07/12/2015 at 8:22 pm to Dr. Janee Morn , who verbally acknowledged these results. Electronically Signed   By: San Morelle M.D.   On: 07/12/2015 20:41   Ct Maxillofacial Wo Cm  07/02/2015  CLINICAL DATA:  Trip and fall injury. No loss of consciousness. Bruising and bleeding over the nose. EXAM: CT HEAD WITHOUT CONTRAST CT MAXILLOFACIAL WITHOUT CONTRAST CT CERVICAL SPINE WITHOUT CONTRAST  TECHNIQUE: Multidetector CT imaging of the head, cervical spine, and maxillofacial structures were performed using the standard protocol without intravenous contrast. Multiplanar CT image reconstructions of the cervical spine and maxillofacial structures were also generated. COMPARISON:  CT head 08/07/2014 FINDINGS: CT HEAD FINDINGS Diffuse cerebral atrophy. Ventricular dilatation consistent with central atrophy. Patchy low-attenuation changes in the deep white matter consistent with small vessel ischemia. Old white matter infarct in the left deep  white matter. No mass effect or midline shift. No abnormal extra-axial fluid collections. Gray-white matter junctions are distinct. Basal cisterns are not effaced. No evidence of acute intracranial hemorrhage. No depressed skull fractures. Partial opacification of paranasal sinuses. Mastoid air cells are not opacified. Vascular calcifications. CT MAXILLOFACIAL FINDINGS The globes and extraocular muscles appear intact and symmetrical. Mucosal thickening in the ethmoid air cells. Prominent mucosal thickening in the right maxillary antrum. Air-fluid levels in the sphenoid sinuses. There appear to be acute nondisplaced fractures of the medial and lateral wall of the right maxillary antrum with mildly depressed anterior nasal bone fractures. The orbital rims appear intact. Left maxillary antral walls appear intact. Mandibles, temporomandibular joints, zygomatic arches, and pterygoid plates appear intact. Soft tissues are unremarkable. CT CERVICAL SPINE FINDINGS Straightening of the usual cervical lordosis. This may be due to patient positioning but ligamentous injury or muscle spasm can also have this appearance and are not excluded. No anterior subluxation. Normal alignment of the facet joints. No vertebral compression deformities. No prevertebral soft tissue swelling. Intervertebral disc space heights are preserved. C1-2 articulation appears intact. No focal bone lesion or bone destruction. Bony fluid and material demonstrated in the nasopharynx and oropharynx. This is likely inflammatory. IMPRESSION: No acute intracranial abnormalities. Chronic atrophy and small vessel ischemic changes. Nondisplaced fractures of the right maxillary antral walls and of the anterior nasal bones. Probable inflammatory mucosal thickening and fluid in the paranasal sinuses. Nonspecific straightening of usual cervical lordosis. No acute displaced fractures identified. Probable inflammatory secretions demonstrated in the nasopharynx.  Electronically Signed   By: Lucienne Capers M.D.   On: 07/02/2015 05:26     Scheduled Meds: . aspirin EC  81 mg Oral Daily  . atorvastatin  20 mg Oral QHS  . clopidogrel  75 mg Oral Daily  . docusate sodium  100 mg Oral Q12H  . famotidine  20 mg Oral QHS  . feeding supplement (ENSURE ENLIVE)  237 mL Oral TID BM  . ferrous sulfate  325 mg Oral Daily  . folic acid  1 mg Oral Daily  . gabapentin  100 mg Oral TID  . insulin aspart  0-15 Units Subcutaneous TID WC  . insulin aspart  0-5 Units Subcutaneous QHS  . metoCLOPramide  10 mg Oral TID AC & HS  . metoprolol tartrate  12.5 mg Oral BID  . midodrine  10 mg Oral TID WC  . sertraline  75 mg Oral Daily  . sodium chloride flush  3 mL Intravenous Q12H   Continuous Infusions:   Time spent: 20 minutes    Elwin Mocha, MD [facility] Pager [phone]  If 7PM-7AM, please contact night-coverage [email or web address] Password [facility] 07/16/2015, 5:46 PM

## 2015-07-17 NOTE — Progress Notes (Signed)
OT Cancellation Note  Patient Details Name: RAJAE SEALEY MRN: [id number] DOB: 13-Nov-1945   Cancelled Treatment:    Reason Eval/Treat Not Completed: Patient at procedure or test/ unavailable (TEE).   Redmond Baseman, OTR/L Pager: [phone] 07/17/2015, 3:16 PM

## 2015-07-17 NOTE — Discharge Summary (Signed)
Physician Discharge Summary  Maria Fox [name] DOB: April 18, 1945 DOA: 07/09/2015  PCP: [name], MD  Admit date: 07/09/2015 Discharge date: pending  Recommendations for Outpatient Follow-up:  1. Home health PT/OT 2. Follow up with Neurology in 2 months or sooner if needed 3. Continue low dose aspirin 81mg  daily and plavix 75mg  daily  4. Due to low hemoglobin A1c, stopped metformin and tradjenta   Discharge Diagnoses:  Principal Problem:   Acute left PCA stroke ([name]) Active Problems:   HYPERTENSION, BENIGN SYSTEMIC   Vitiligo   HLD (hyperlipidemia)   DM (diabetes mellitus), type 2, uncontrolled w/neurologic complication ([name])   CAD (coronary artery disease), native coronary artery   Diastolic CHF ([name])   Anemia, iron deficiency   Acute kidney injury ([place])   Fall   Maxillary fracture ([place])   Hypoxia   Acute CVA (cerebrovascular accident) [facility])   Discharge Condition: stable, improved  Diet recommendation: diabetic/healthy heart  Wt Readings from Last 3 Encounters:  07/17/15 63.957 kg (141 lb)  07/01/15 68.153 kg (150 lb 4 oz)  10/04/14 54.486 kg (120 lb 1.9 oz)    History of present illness:   Maria Fox is a 70 y.o. female past medical history of CAD status post CABG 3 [id number], diastolic heart failure, prostatic hypertension, diabetes who presented to the ED with generalized weakness.  She had a fall about one week prior to admission and was seen by ENT for maxillary fracture and epistaxis and had nasal packing.  She returned to the ER because of generalized weakness, nausea, vomiting, decreased oral intake. She denied any headache dizziness syncope or near-syncope.  In the emergency department oxygen saturation level 89% with ambulation on room air.  She was orthostatic and receives 3 L of fluid in the emergency department.    Hospital Course:   Falls with LE weakness, orthostatic hypotension, and new L PCA territory stroke and remote lacunar strokes.  She  was hydrated and screened for infection, but continue to be weak.  She was noted to have unsteady gait and MRI was performed which demonstrated an acute to subacute left PCA territory stroke.  There was also evidence of remote lacunar infarcts left lentiform nucleus and corona radiata. Neurology was consulted and she underwent further testing.  Carotid duplex demonstrated no evidence of hemodynamically significant ICA stenosis and antegrade vertebral artery flow.  TEE demonstrated preserved EF with no evidence of PFO or ASD or intracardiac thrombus.  Loop recorder was placed.  LDL was 95 and hemoglobin A1c 4.9.  Her atorvastatin dose was increased to 40mg  daily .  No changes were made to her aspirin and plavix.  She worked with physical and occupational therapy who recommended home health services which were arrange.    CKD stage III, creatinine stable between 1.2 and 1.4.    Falls due to orthostatic hypotension, recent stroke, and autonomic dysfunction in the setting of long standing diabetes and now new stroke.  Home health services.    Essential hypertension, reasonable inpatient control, continued metoprolol.  DM (diabetes mellitus), type 2, uncontrolled w/neurologic complication and gastroparesis, autonomic dysfunction ([place]).  She was given SSI and reglan.  A1c was 4.9 and therefore at risk for hypoglycemia.  Stopped both metformin and tradjenta to reduce pill burden and reduce risk of hypoglycemia.  Recommend no restrictions on diet due to poor oral intake.    CAD (coronary artery disease), native coronary artery, chest pain free.  Continued aspirin and plavix and increased to high dose statin.  Diastolic CHF ([place]), appears euvolemic.    Anemia, iron deficiency, no signs of active bleeding.  Hemoglobin approximately stable near 10mg /dl.  Follow up with PCP.    Consultants:   Neurology  PT/OT/SLP  Procedures:   ECHO 5/4  Vascular carotid doppler 5/4  Transcranial Doppler  TEE  5/8  Loop recorder 5/8  Antimicrobials:   None  Discharge Exam: Filed Vitals:   07/17/15 1640 07/17/15 1728  BP: 131/59 114/47  Pulse: 60 80  Temp:  98.7 F (37.1 C)  Resp: 15 18   Filed Vitals:   07/17/15 1620 07/17/15 1630 07/17/15 1640 07/17/15 1728  BP: 141/56 120/63 131/59 114/47  Pulse: 63 32 60 80  Temp: 97.6 F (36.4 C)   98.7 F (37.1 C)  TempSrc: Oral   Oral  Resp: 22 32 15 18  Height:      Weight:      SpO2: 95% 93% 97% 98%   General exam: Frail female, asleep but arouseable, Appears calm and comfortable Respiratory system: Clear to auscultation. Respiratory effort normal. Cardiovascular system: S1 & S2 heard, RRR. No JVD, murmurs, rubs, gallops or clicks. No pedal edema. Gastrointestinal system: Abdomen is nondistended, soft and nontender MSK:  Normal tone and bulk, no lower extremity edema Extremities: CN II - XII intact, strength symmetric in upper extremities 5-/5 And lower extremities.  SITLT.  Mild dysmetria bilaterally  Skin: diffuse vitiligo   Discharge Instructions      Discharge Instructions    AMB Referral to [place] [facility]    Complete by:  As directed   Please assign to [place] for disease and symptom management for CHF, DM and medication compliance. Written consent obtained. Best contact number is [phone] and significant other, [name], can also be contacted if unable to reach patient at [phone]. Please call with questions. Thanks. Marthenia Rolling, [place], RN,BSN-[facility] [id number]  Reason for consult:  Please assign to [place]  Expected date of contact:  1-3 days (reserved for hospital discharges)     Diet - low sodium heart healthy    Complete by:  As directed      Increase activity slowly    Complete by:  As directed             Medication List    STOP taking these medications        cephALEXin 500 MG capsule  Commonly known as:  KEFLEX     metFORMIN 500 MG tablet  Commonly  known as:  GLUCOPHAGE     TRADJENTA 5 MG Tabs tablet  Generic drug:  linagliptin      TAKE these medications        aspirin EC 81 MG tablet  Take 81 mg by mouth daily.     atorvastatin 40 MG tablet  Commonly known as:  LIPITOR  Take 1 tablet (40 mg total) by mouth at bedtime.     clopidogrel 75 MG tablet  Commonly known as:  PLAVIX  Take 1 tablet (75 mg total) by mouth daily.     docusate sodium 100 MG capsule  Commonly known as:  COLACE  Take 1 capsule (100 mg total) by mouth every 12 (twelve) hours.     famotidine 20 MG tablet  Commonly known as:  PEPCID  Take 1 tablet (20 mg total) by mouth at bedtime.     feeding supplement (GLUCERNA SHAKE) Liqd  Take 237 mLs by mouth 3 (three) times daily between meals.  ferrous sulfate 325 (65 FE) MG tablet  Take 1 tablet (325 mg total) by mouth daily.     folic acid 1 MG tablet  Commonly known as:  FOLVITE  Take 1 tablet (1 mg total) by mouth daily.     gabapentin 100 MG capsule  Commonly known as:  NEURONTIN  TAKE ONE CAPSULE BY MOUTH THREE TIMES DAILY     HYDROcodone-acetaminophen 5-325 MG tablet  Commonly known as:  NORCO  Take 1-2 tablets by mouth every 4 (four) hours as needed.     hydrocortisone cream 1 %  Apply 1 application topically 2 (two) times daily.     hydrOXYzine 10 MG tablet  Commonly known as:  ATARAX/VISTARIL  Take 1 tablet (10 mg total) by mouth 3 (three) times daily as needed for itching.     meclizine 25 MG tablet  Commonly known as:  ANTIVERT  TAKE ONE TABLET BY MOUTH THREE TIMES DAILY AS NEEDED FOR DIZZINESS     metoCLOPramide 10 MG tablet  Commonly known as:  REGLAN  Take 1 tablet (10 mg total) by mouth every 6 (six) hours as needed for nausea or vomiting.     metoprolol tartrate 25 MG tablet  Commonly known as:  LOPRESSOR  Take 0.5 tablets (12.5 mg total) by mouth 2 (two) times daily.     midodrine 10 MG tablet  Commonly known as:  PROAMATINE  Take 1 tablet (10 mg total) by mouth 3  (three) times daily with meals.     ondansetron 4 MG disintegrating tablet  Commonly known as:  ZOFRAN ODT  4mg  ODT q8 hours prn nausea/vomit     sertraline 50 MG tablet  Commonly known as:  ZOLOFT  Take 1.5 tablets (75 mg total) by mouth daily.     traMADol 50 MG tablet  Commonly known as:  ULTRAM  TAKE ONE TABLET BY MOUTH EVERY 6 HOURS AS NEEDED     Vitamin D (Ergocalciferol) 50000 units Caps capsule  Commonly known as:  DRISDOL  TAKE ONE CAPSULE BY MOUTH EVERY 7 DAYS.       Follow-up Information    Follow up with [name], MD.   Specialty:  [facility] Medicine   Contact information:   [place] [address] [phone]       Follow up with [facility] On 07/27/2015.   Specialty:  Cardiology   Why:  10:30AM, wound check   Contact information:   [address] [place] [phone]      Follow up with Xu,Jindong, MD. Schedule an appointment as soon as possible for a visit in 1 month.   Specialty:  Neurology   Contact information:   [address] [place] [id number] [phone]        The results of significant diagnostics from this hospitalization (including imaging, microbiology, ancillary and laboratory) are listed below for reference.    Significant Diagnostic Studies: Dg Chest 2 View  07/09/2015  CLINICAL DATA:  Hypoxia. Dizziness. Nausea and vomiting. Cough. Symptoms for 4 days. EXAM: CHEST  2 VIEW COMPARISON:  09/09/2014 FINDINGS: Normal heart size. Subsegmental atelectasis of the left base. Postoperative changes. External objects project over the upper lung zones. IMPRESSION: Subsegmental atelectasis at the left base. Electronically Signed   By: Marybelle Killings M.D.   On: 07/09/2015 08:59   Ct Head Wo Contrast  07/02/2015  CLINICAL DATA:  Trip and fall injury. No loss  of consciousness. Bruising and bleeding over the nose. EXAM: CT HEAD WITHOUT CONTRAST CT  MAXILLOFACIAL WITHOUT CONTRAST CT CERVICAL SPINE WITHOUT CONTRAST TECHNIQUE: Multidetector CT imaging of the head, cervical spine, and maxillofacial structures were performed using the standard protocol without intravenous contrast. Multiplanar CT image reconstructions of the cervical spine and maxillofacial structures were also generated. COMPARISON:  CT head 08/07/2014 FINDINGS: CT HEAD FINDINGS Diffuse cerebral atrophy. Ventricular dilatation consistent with central atrophy. Patchy low-attenuation changes in the deep white matter consistent with small vessel ischemia. Old white matter infarct in the left deep white matter. No mass effect or midline shift. No abnormal extra-axial fluid collections. Gray-white matter junctions are distinct. Basal cisterns are not effaced. No evidence of acute intracranial hemorrhage. No depressed skull fractures. Partial opacification of paranasal sinuses. Mastoid air cells are not opacified. Vascular calcifications. CT MAXILLOFACIAL FINDINGS The globes and extraocular muscles appear intact and symmetrical. Mucosal thickening in the ethmoid air cells. Prominent mucosal thickening in the right maxillary antrum. Air-fluid levels in the sphenoid sinuses. There appear to be acute nondisplaced fractures of the medial and lateral wall of the right maxillary antrum with mildly depressed anterior nasal bone fractures. The orbital rims appear intact. Left maxillary antral walls appear intact. Mandibles, temporomandibular joints, zygomatic arches, and pterygoid plates appear intact. Soft tissues are unremarkable. CT CERVICAL SPINE FINDINGS Straightening of the usual cervical lordosis. This may be due to patient positioning but ligamentous injury or muscle spasm can also have this appearance and are not excluded. No anterior subluxation. Normal alignment of the facet joints. No vertebral compression deformities. No prevertebral soft tissue swelling. Intervertebral disc space heights are  preserved. C1-2 articulation appears intact. No focal bone lesion or bone destruction. Bony fluid and material demonstrated in the nasopharynx and oropharynx. This is likely inflammatory. IMPRESSION: No acute intracranial abnormalities. Chronic atrophy and small vessel ischemic changes. Nondisplaced fractures of the right maxillary antral walls and of the anterior nasal bones. Probable inflammatory mucosal thickening and fluid in the paranasal sinuses. Nonspecific straightening of usual cervical lordosis. No acute displaced fractures identified. Probable inflammatory secretions demonstrated in the nasopharynx. Electronically Signed   By: Lucienne Capers M.D.   On: 07/02/2015 05:26   Ct Cervical Spine Wo Contrast  07/02/2015  CLINICAL DATA:  Trip and fall injury. No loss of consciousness. Bruising and bleeding over the nose. EXAM: CT HEAD WITHOUT CONTRAST CT MAXILLOFACIAL WITHOUT CONTRAST CT CERVICAL SPINE WITHOUT CONTRAST TECHNIQUE: Multidetector CT imaging of the head, cervical spine, and maxillofacial structures were performed using the standard protocol without intravenous contrast. Multiplanar CT image reconstructions of the cervical spine and maxillofacial structures were also generated. COMPARISON:  CT head 08/07/2014 FINDINGS: CT HEAD FINDINGS Diffuse cerebral atrophy. Ventricular dilatation consistent with central atrophy. Patchy low-attenuation changes in the deep white matter consistent with small vessel ischemia. Old white matter infarct in the left deep white matter. No mass effect or midline shift. No abnormal extra-axial fluid collections. Gray-white matter junctions are distinct. Basal cisterns are not effaced. No evidence of acute intracranial hemorrhage. No depressed skull fractures. Partial opacification of paranasal sinuses. Mastoid air cells are not opacified. Vascular calcifications. CT MAXILLOFACIAL FINDINGS The globes and extraocular muscles appear intact and symmetrical. Mucosal  thickening in the ethmoid air cells. Prominent mucosal thickening in the right maxillary antrum. Air-fluid levels in the sphenoid sinuses. There appear to be acute nondisplaced fractures of the medial and lateral wall of the right maxillary antrum with mildly depressed anterior nasal bone fractures. The orbital rims appear  intact. Left maxillary antral walls appear intact. Mandibles, temporomandibular joints, zygomatic arches, and pterygoid plates appear intact. Soft tissues are unremarkable. CT CERVICAL SPINE FINDINGS Straightening of the usual cervical lordosis. This may be due to patient positioning but ligamentous injury or muscle spasm can also have this appearance and are not excluded. No anterior subluxation. Normal alignment of the facet joints. No vertebral compression deformities. No prevertebral soft tissue swelling. Intervertebral disc space heights are preserved. C1-2 articulation appears intact. No focal bone lesion or bone destruction. Bony fluid and material demonstrated in the nasopharynx and oropharynx. This is likely inflammatory. IMPRESSION: No acute intracranial abnormalities. Chronic atrophy and small vessel ischemic changes. Nondisplaced fractures of the right maxillary antral walls and of the anterior nasal bones. Probable inflammatory mucosal thickening and fluid in the paranasal sinuses. Nonspecific straightening of usual cervical lordosis. No acute displaced fractures identified. Probable inflammatory secretions demonstrated in the nasopharynx. Electronically Signed   By: Lucienne Capers M.D.   On: 07/02/2015 05:26   Mr Brain Wo Contrast  07/12/2015  CLINICAL DATA:  Dizziness.  Diffuse weakness. EXAM: MRI HEAD WITHOUT CONTRAST TECHNIQUE: Multiplanar, multiecho pulse sequences of the brain and surrounding structures were obtained without intravenous contrast. COMPARISON:  CT head without contrast 07/02/2015. FINDINGS: Extensive areas are restricted diffusion involves the medial left  occipital lobe and parietal lobe as well as the left side of the splenium of the corpus callosum. There scattered areas of restricted diffusion within the left thalamus. Findings are compatible with acute/ subacute nonhemorrhagic left PCA distribution infarct. T2 and FLAIR signal changes suggest a subacute time frame. No acute hemorrhage is associated. There is no mass lesion. Remote lacunar infarcts are present in the left basal ganglia and coronal radiata. Moderate periventricular T2 changes are advanced for age. There is moderate generalized atrophy. The ventricles are proportionate to the degree of atrophy. Mild white matter changes extend into the brainstem. The cerebellum is unremarkable. The internal auditory canals are within normal limits bilaterally. Flow is present in the major intracranial arteries. Bilateral lens replacements are present. The globes and orbits are otherwise unremarkable. Mild mucosal thickening is evident in the right maxillary sinus. The remaining paranasal sinuses and the mastoid air cells are clear. The skullbase is within normal limits. Midline sagittal images are unremarkable. IMPRESSION: 1. Acute/subacute left PCA territory infarct 2. Moderate atrophy and white matter disease likely reflects the sequela of chronic microvascular ischemia. 3. Remote lacunar infarcts involving the left lentiform nucleus and corona radiata. 4. Minimal right maxillary sinus disease. These results were called by telephone at the time of interpretation on 07/12/2015 at 8:22 pm to Dr. Janee Morn , who verbally acknowledged these results. Electronically Signed   By: San Morelle M.D.   On: 07/12/2015 20:41   Ct Maxillofacial Wo Cm  07/02/2015  CLINICAL DATA:  Trip and fall injury. No loss of consciousness. Bruising and bleeding over the nose. EXAM: CT HEAD WITHOUT CONTRAST CT MAXILLOFACIAL WITHOUT CONTRAST CT CERVICAL SPINE WITHOUT CONTRAST TECHNIQUE: Multidetector CT imaging of the head,  cervical spine, and maxillofacial structures were performed using the standard protocol without intravenous contrast. Multiplanar CT image reconstructions of the cervical spine and maxillofacial structures were also generated. COMPARISON:  CT head 08/07/2014 FINDINGS: CT HEAD FINDINGS Diffuse cerebral atrophy. Ventricular dilatation consistent with central atrophy. Patchy low-attenuation changes in the deep white matter consistent with small vessel ischemia. Old white matter infarct in the left deep white matter. No mass effect or midline shift. No abnormal extra-axial fluid collections. Gray-white  matter junctions are distinct. Basal cisterns are not effaced. No evidence of acute intracranial hemorrhage. No depressed skull fractures. Partial opacification of paranasal sinuses. Mastoid air cells are not opacified. Vascular calcifications. CT MAXILLOFACIAL FINDINGS The globes and extraocular muscles appear intact and symmetrical. Mucosal thickening in the ethmoid air cells. Prominent mucosal thickening in the right maxillary antrum. Air-fluid levels in the sphenoid sinuses. There appear to be acute nondisplaced fractures of the medial and lateral wall of the right maxillary antrum with mildly depressed anterior nasal bone fractures. The orbital rims appear intact. Left maxillary antral walls appear intact. Mandibles, temporomandibular joints, zygomatic arches, and pterygoid plates appear intact. Soft tissues are unremarkable. CT CERVICAL SPINE FINDINGS Straightening of the usual cervical lordosis. This may be due to patient positioning but ligamentous injury or muscle spasm can also have this appearance and are not excluded. No anterior subluxation. Normal alignment of the facet joints. No vertebral compression deformities. No prevertebral soft tissue swelling. Intervertebral disc space heights are preserved. C1-2 articulation appears intact. No focal bone lesion or bone destruction. Bony fluid and material  demonstrated in the nasopharynx and oropharynx. This is likely inflammatory. IMPRESSION: No acute intracranial abnormalities. Chronic atrophy and small vessel ischemic changes. Nondisplaced fractures of the right maxillary antral walls and of the anterior nasal bones. Probable inflammatory mucosal thickening and fluid in the paranasal sinuses. Nonspecific straightening of usual cervical lordosis. No acute displaced fractures identified. Probable inflammatory secretions demonstrated in the nasopharynx. Electronically Signed   By: Lucienne Capers M.D.   On: 07/02/2015 05:26    Microbiology: Recent Results (from the past 240 hour(s))  Culture, Urine     Status: Abnormal   Collection Time: 07/09/15  9:32 AM  Result Value Ref Range Status   Specimen Description URINE, CLEAN CATCH  Final   Special Requests NONE  Final   Culture MULTIPLE SPECIES PRESENT, SUGGEST RECOLLECTION (A)  Final   Report Status 07/11/2015 FINAL  Final     Labs: Basic Metabolic Panel:  Recent Labs Lab 07/11/15 0401 07/12/15 0255 07/13/15 0258 07/14/15 0223 07/16/15 0812  NA 138 138 138 141 140  K 4.1 3.9 3.9 4.1 4.8  CL 108 107 106 106 106  CO2 23 21* 22 24 22   GLUCOSE 135* 92 137* 101* 139*  BUN 12 14 15 15 16   CREATININE 1.22* 1.18* 1.31* 1.30* 1.46*  CALCIUM 8.8* 8.9 8.7* 8.9 9.6   Liver Function Tests: No results for input(s): AST, ALT, ALKPHOS, BILITOT, PROT, ALBUMIN in the last 168 hours. No results for input(s): LIPASE, AMYLASE in the last 168 hours. No results for input(s): AMMONIA in the last 168 hours. CBC:  Recent Labs Lab 07/11/15 0401 07/12/15 0255 07/13/15 0258 07/14/15 0223 07/16/15 0812  WBC 6.7 8.0 7.5 6.8 7.0  NEUTROABS  --   --   --   --  3.9  HGB 8.4* 9.2* 9.3* 9.1* 10.2*  HCT 25.3* 27.9* 28.0* 28.6* 31.8*  MCV 87.5 89.7 90.0 89.9 90.3  PLT 303 367 379 388 377   Cardiac Enzymes: No results for input(s): CKTOTAL, CKMB, CKMBINDEX, TROPONINI in the last 168 hours. BNP: BNP  (last 3 results)  Recent Labs  08/07/14 1344 09/09/14 1315  BNP 297.8* 118.6*    ProBNP (last 3 results) No results for input(s): PROBNP in the last 8760 hours.  CBG:  Recent Labs Lab 07/16/15 1622 07/16/15 2338 07/17/15 0647 07/17/15 1119 07/17/15 1725  GLUCAP 109* 167* 124* 137* 142*    Time coordinating discharge: 35  minutes  Signed:  Shlomo Seres  [facility] 07/17/2015, 6:30 PM

## 2015-07-18 ENCOUNTER — Encounter (HOSPITAL_COMMUNITY): Payer: Self-pay | Admitting: Internal Medicine

## 2015-07-18 DIAGNOSIS — I2510 Atherosclerotic heart disease of native coronary artery without angina pectoris: Secondary | ICD-10-CM

## 2015-07-18 DIAGNOSIS — W19XXXD Unspecified fall, subsequent encounter: Secondary | ICD-10-CM

## 2015-07-18 DIAGNOSIS — E785 Hyperlipidemia, unspecified: Secondary | ICD-10-CM

## 2015-07-18 DIAGNOSIS — I10 Essential (primary) hypertension: Secondary | ICD-10-CM

## 2015-07-18 DIAGNOSIS — D509 Iron deficiency anemia, unspecified: Secondary | ICD-10-CM

## 2015-07-18 DIAGNOSIS — N179 Acute kidney failure, unspecified: Principal | ICD-10-CM

## 2015-07-18 LAB — GLUCOSE, CAPILLARY
GLUCOSE-CAPILLARY: 166 mg/dL — AB (ref 65–99)
Glucose-Capillary: 185 mg/dL — ABNORMAL HIGH (ref 65–99)

## 2015-07-18 NOTE — Progress Notes (Signed)
Occupational Therapy Treatment Patient Details Name: Maria Fox MRN: [id number] DOB: [birth year] Today's Date: 07/18/2015    History of present illness Pt admitted with acute kidney injury and borderline orthostatic hypotension. On 5/3 MRI showed Acute/subacute left PCA territory infarct. PMH - orthostatic hypotension, HTN, DM, CVA, CABG, PNA   OT comments  Pt progressing. Education provided in session. Continue to recommend 24/7 supervision/assist upon d/c.  Follow Up Recommendations  No OT follow up;Supervision/Assistance - 24 hour    Equipment Recommendations  None recommended by OT    Recommendations for Other Services      Precautions / Restrictions Precautions Precautions: Fall Precaution Comments: watch BP Restrictions Weight Bearing Restrictions: No       Mobility Bed Mobility               General bed mobility comments: not assessed  Transfers Overall transfer level: Needs assistance   Transfers: Sit to/from Stand Sit to Stand: Supervision              Balance    Pt unsteady when she became lightheaded. Performed functional tasks standing at sink without LOB.                               ADL Overall ADL's : Needs assistance/impaired     Grooming: Wash/dry face;Applying deodorant;Standing;Oral care;Supervision/safety;Set up Grooming Details (indicate cue type and reason): supervision standing at sink and set up for toothbrush and deodorant; pt retrieved washcloth from near cabinet. Upper Body Bathing: Standing;Min guard Upper Body Bathing Details (indicate cue type and reason): rinsed off armpits; Min guard to ambulate to retrieve washcloth. Lower Body Bathing: Min guard (standing; Min guard to ambulate to retrieve washcloth) Lower Body Bathing Details (indicate cue type and reason): washed bottom and managed gown       Lower Body Dressing Details (indicate cue type and reason): adjusted sock with no difficulty or assist when  sitting in chair Toilet Transfer: Min guard;Ambulation       Tub/ Shower Transfer: Tub transfer;Min guard;Ambulation   Functional mobility during ADLs: Min guard General ADL Comments: educated on safety such as recommended having someone with her for tub or shower transfer, safe footwear, and rugs/items on floor. Recommended significant other be close to her when pt gets up.       Vision                     Perception     Praxis      Cognition  Awake/Alert Behavior During Therapy: WFL for tasks assessed/performed;Flat affect Overall Cognitive Status:  (unsure of baseline; not oriented to year)                       Extremity/Trunk Assessment               Exercises     Shoulder Instructions       General Comments      Pertinent Vitals/ Pain       Pain Assessment: No/denies pain  Home Living                                          Prior Functioning/Environment              Frequency Min 3X/week     Progress  Toward Goals  OT Goals(current goals can now be found in the care plan section)  Progress towards OT goals: Progressing toward goals  Acute Rehab OT Goals Patient Stated Goal: not stated OT Goal Formulation: With patient/family Time For Goal Achievement: 07/21/15 Potential to Achieve Goals: Good ADL Goals Pt Will Perform Grooming: with modified independence;standing Pt Will Perform Lower Body Bathing: with modified independence;sit to/from stand Pt Will Perform Lower Body Dressing: with modified independence;sit to/from stand Pt Will Transfer to Toilet: with modified independence;ambulating;bedside commode Pt Will Perform Tub/Shower Transfer: Tub transfer;rolling walker;ambulating;shower seat;with supervision Additional ADL Goal #1: Pt will be overall mod I with functional ambulation/mobility using LRAD during ADL   Plan Discharge plan remains appropriate    Co-evaluation                 End of  Session Equipment Utilized During Treatment: Gait belt   Activity Tolerance Other (comment) (lightheaded)   Patient Left in chair;with call bell/phone within reach;with family/visitor present   Nurse Communication Other (comment) (pt lightheaded/dizzy)        [id number] OT Time Calculation (min): 16 min  Charges: OT General Charges $OT Visit: 1 Procedure OT Treatments $Self Care/Home Management : 8-22 mins  Benito Mccreedy OTR/L [id number] 07/18/2015, 10:24 AM

## 2015-07-18 NOTE — Progress Notes (Signed)
Pt with discharge order but requested to eat lunch prior to discharge.   Ave Filter, RN

## 2015-07-18 NOTE — Discharge Summary (Addendum)
Physician Discharge Summary  Maria Fox [id number] DOB: Nov 24, 1945 DOA: 07/09/2015  PCP: [name], MD  Admit date: 07/09/2015 Discharge date: pending  Recommendations for Outpatient Follow-up:  1. Home health PT/OT ordered, but family declined and preferred outpatient PT/OT 2. Follow up with Neurology in 2 months or sooner if needed 3. Continue low dose aspirin 81mg  daily and plavix 75mg  daily  4. Due to low hemoglobin A1c, stopped metformin and tradjenta  5. Stop hydrocodone.  Cut meclizine and hydroxyzine in half until follow up with primary care doctor.  PCP to please address oversedation that is concern to both patient and her husband.    Discharge Diagnoses:  Principal Problem:   Acute left PCA stroke ([name]) Active Problems:   HYPERTENSION, BENIGN SYSTEMIC   Vitiligo   HLD (hyperlipidemia)   DM (diabetes mellitus), type 2, uncontrolled w/neurologic complication ([name])   CAD (coronary artery disease), native coronary artery   Diastolic CHF ([name])   Anemia, iron deficiency   Acute kidney injury ([place])   Fall   Maxillary fracture ([place])   Hypoxia   Acute CVA (cerebrovascular accident) [facility])   Discharge Condition: stable, improved  Diet recommendation: diabetic/healthy heart  Wt Readings from Last 3 Encounters:  07/18/15 69.264 kg (152 lb 11.2 oz)  07/01/15 68.153 kg (150 lb 4 oz)  10/04/14 54.486 kg (120 lb 1.9 oz)    History of present illness:   Maria Fox is a 70 y.o. female past medical history of CAD status post CABG 3 [id number], diastolic heart failure, prostatic hypertension, diabetes who presented to the ED with generalized weakness.  She had a fall about one week prior to admission and was seen by ENT for maxillary fracture and epistaxis and had nasal packing.  She returned to the ER because of generalized weakness, nausea, vomiting, decreased oral intake. She denied any headache dizziness syncope or near-syncope.  In the emergency department oxygen  saturation level 89% with ambulation on room air.  She was orthostatic and receives 3 L of fluid in the emergency department.    Hospital Course:   Falls with LE weakness, orthostatic hypotension, and new L PCA territory stroke and remote lacunar strokes.  She was hydrated and screened for infection, but continue to be weak.  She was noted to have unsteady gait and MRI was performed which demonstrated an acute to subacute left PCA territory stroke.  There was also evidence of remote lacunar infarcts left lentiform nucleus and corona radiata. Neurology was consulted and she underwent further testing.  Carotid duplex demonstrated no evidence of hemodynamically significant ICA stenosis and antegrade vertebral artery flow.  TEE demonstrated preserved EF with no evidence of PFO or ASD or intracardiac thrombus.  Loop recorder was placed.  LDL was 95 and hemoglobin A1c 4.9.  Her atorvastatin dose was increased to 40mg  daily .  No changes were made to her aspirin and plavix.  She worked with physical and occupational therapy who recommended home health services which were arrange.    CKD stage III, creatinine stable between 1.2 and 1.4.    Falls due to orthostatic hypotension, recent stroke, and autonomic dysfunction in the setting of long standing diabetes and now new stroke.  Home health services.    Essential hypertension, reasonable inpatient control, continued metoprolol.  DM (diabetes mellitus), type 2, uncontrolled w/neurologic complication and gastroparesis, autonomic dysfunction ([place]).  She was given SSI and reglan.  A1c was 4.9 and therefore at risk for hypoglycemia.  Stopped both metformin and tradjenta  to reduce pill burden and reduce risk of hypoglycemia.  Recommend no restrictions on diet due to poor oral intake.    CAD (coronary artery disease), native coronary artery, chest pain free.  Continued aspirin and plavix and increased to high dose statin.    Diastolic CHF ([place]), appears euvolemic.     Anemia, iron deficiency, no signs of active bleeding.  Hemoglobin approximately stable near 10mg /dl.  Follow up with PCP.    Sedation/polypharmacy, concern of patient and her husband.  Likely medication related and advised her to stop her hydrocodone and cut her meclizine and hydroxyzine tabs in half.  She should continue to talk to her primary care doctor about reducing polypharmacy.    Consultants:   Neurology  PT/OT/SLP  Procedures:   ECHO 5/4  Vascular carotid doppler 5/4  Transcranial Doppler  TEE 5/8  Loop recorder 5/8  Antimicrobials:   None  Discharge Exam: Filed Vitals:   07/18/15 0152 07/18/15 0455  BP: 110/60 99/61  Pulse: 69 66  Temp: 98.3 F (36.8 C) 98.7 F (37.1 C)  Resp: 18 18   Filed Vitals:   07/17/15 2118 07/18/15 0152 07/18/15 0455 07/18/15 0611  BP: 110/52 110/60 99/61   Pulse: 91 69 66   Temp: 98 F (36.7 C) 98.3 F (36.8 C) 98.7 F (37.1 C)   TempSrc: Oral Oral Oral   Resp: 18 18 18    Height:      Weight:    69.264 kg (152 lb 11.2 oz)  SpO2: 96% 97% 94%    General exam: Frail female, asleep but arouseable, Appears calm and comfortable Respiratory system: Clear to auscultation. Respiratory effort normal. Cardiovascular system: S1 & S2 heard, RRR. No JVD, murmurs, rubs, gallops or clicks. No pedal edema. Gastrointestinal system: Abdomen is nondistended, soft and nontender MSK:  Normal tone and bulk, no lower extremity edema Extremities: CN II - XII intact, strength symmetric in upper extremities 5-/5 And lower extremities.  SITLT.  Mild dysmetria bilaterally  Skin: diffuse vitiligo   Discharge Instructions      Discharge Instructions    AMB Referral to [facility]    Complete by:  As directed   Please assign to [facility] for disease and symptom management for CHF, DM and medication compliance. Written consent obtained. Best contact number is [phone] and significant other, [name], can also be contacted if  unable to reach patient at [phone]. Please call with questions. Thanks. Marthenia Rolling, [place], [facility] Liaison-[phone]  Reason for consult:  Please assign to [facility]  Expected date of contact:  1-3 days (reserved for hospital discharges)     Ambulatory referral to Neurology    Complete by:  As directed   Pt will follow up with Dr. Erlinda Hong at [facility] in about 2 months. Thanks.     Call MD for:  difficulty breathing, headache or visual disturbances    Complete by:  As directed      Call MD for:  extreme fatigue    Complete by:  As directed      Call MD for:  hives    Complete by:  As directed      Call MD for:  persistant dizziness or light-headedness    Complete by:  As directed      Call MD for:  persistant nausea and vomiting    Complete by:  As directed      Call MD for:  severe uncontrolled pain    Complete by:  As directed      Call MD for:  temperature >100.4    Complete by:  As directed      Diet - low sodium heart healthy    Complete by:  As directed      Diet - low sodium heart healthy    Complete by:  As directed      Discharge instructions    Complete by:  As directed   Please stop your hydrocodone and cut your hydroxyzine and meclizine in half so that you are not so sleepy all the time.  Talk to your primary care doctor about these medications.  Please also stop your diabetes medications because you are at risk of low blood sugars.  You do not need to take any diabetes medications at this time.  Please see your primary care doctor in 1-2 weeks.  You have an appointment already scheduled with cardiology and please schedule an appointment with Neurology.     Increase activity slowly    Complete by:  As directed      Increase activity slowly    Complete by:  As directed             Medication List    STOP taking these medications        cephALEXin 500 MG capsule  Commonly known as:  KEFLEX     HYDROcodone-acetaminophen 5-325 MG tablet   Commonly known as:  NORCO     metFORMIN 500 MG tablet  Commonly known as:  GLUCOPHAGE     TRADJENTA 5 MG Tabs tablet  Generic drug:  linagliptin      TAKE these medications        aspirin EC 81 MG tablet  Take 81 mg by mouth daily.     atorvastatin 40 MG tablet  Commonly known as:  LIPITOR  Take 1 tablet (40 mg total) by mouth at bedtime.     clopidogrel 75 MG tablet  Commonly known as:  PLAVIX  Take 1 tablet (75 mg total) by mouth daily.     docusate sodium 100 MG capsule  Commonly known as:  COLACE  Take 1 capsule (100 mg total) by mouth every 12 (twelve) hours.     famotidine 20 MG tablet  Commonly known as:  PEPCID  Take 1 tablet (20 mg total) by mouth at bedtime.     feeding supplement (GLUCERNA SHAKE) Liqd  Take 237 mLs by mouth 3 (three) times daily between meals.     ferrous sulfate 325 (65 FE) MG tablet  Take 1 tablet (325 mg total) by mouth daily.     folic acid 1 MG tablet  Commonly known as:  FOLVITE  Take 1 tablet (1 mg total) by mouth daily.     gabapentin 100 MG capsule  Commonly known as:  NEURONTIN  TAKE ONE CAPSULE BY MOUTH THREE TIMES DAILY     hydrocortisone cream 1 %  Apply 1 application topically 2 (two) times daily.     hydrOXYzine 10 MG tablet  Commonly known as:  ATARAX/VISTARIL  Take 1 tablet (10 mg total) by mouth 3 (three) times daily as needed for itching.     meclizine 25 MG tablet  Commonly known as:  ANTIVERT  TAKE ONE TABLET BY MOUTH THREE TIMES DAILY AS NEEDED FOR DIZZINESS     metoCLOPramide 10 MG tablet  Commonly known as:  REGLAN  Take 1 tablet (10 mg total) by mouth every 6 (six) hours as needed for nausea  or vomiting.     metoprolol tartrate 25 MG tablet  Commonly known as:  LOPRESSOR  Take 0.5 tablets (12.5 mg total) by mouth 2 (two) times daily.     midodrine 10 MG tablet  Commonly known as:  PROAMATINE  Take 1 tablet (10 mg total) by mouth 3 (three) times daily with meals.     ondansetron 4 MG  disintegrating tablet  Commonly known as:  ZOFRAN ODT  4mg  ODT q8 hours prn nausea/vomit     sertraline 50 MG tablet  Commonly known as:  ZOLOFT  Take 1.5 tablets (75 mg total) by mouth daily.     traMADol 50 MG tablet  Commonly known as:  ULTRAM  TAKE ONE TABLET BY MOUTH EVERY 6 HOURS AS NEEDED     Vitamin D (Ergocalciferol) 50000 units Caps capsule  Commonly known as:  DRISDOL  TAKE ONE CAPSULE BY MOUTH EVERY 7 DAYS.       Follow-up Information    Follow up with [name], MD.   Specialty:  [facility]   Contact information:   [place] [address] [phone]       Follow up with [facility] On 07/27/2015.   Specialty:  Cardiology   Why:  10:30AM, wound check   Contact information:   [address], [place] [phone]      Follow up with Xu,Jindong, MD. Schedule an appointment as soon as possible for a visit in 2 months.   Specialty:  Neurology   Contact information:   [address] [place] [address] [phone]        The results of significant diagnostics from this hospitalization (including imaging, microbiology, ancillary and laboratory) are listed below for reference.    Significant Diagnostic Studies: Dg Chest 2 View  07/09/2015  CLINICAL DATA:  Hypoxia. Dizziness. Nausea and vomiting. Cough. Symptoms for 4 days. EXAM: CHEST  2 VIEW COMPARISON:  09/09/2014 FINDINGS: Normal heart size. Subsegmental atelectasis of the left base. Postoperative changes. External objects project over the upper lung zones. IMPRESSION: Subsegmental atelectasis at the left base. Electronically Signed   By: Marybelle Killings M.D.   On: 07/09/2015 08:59   Ct Head Wo Contrast  07/02/2015  CLINICAL DATA:  Trip and fall injury. No loss of consciousness. Bruising and bleeding over the nose. EXAM: CT HEAD WITHOUT CONTRAST CT MAXILLOFACIAL WITHOUT CONTRAST CT CERVICAL SPINE  WITHOUT CONTRAST TECHNIQUE: Multidetector CT imaging of the head, cervical spine, and maxillofacial structures were performed using the standard protocol without intravenous contrast. Multiplanar CT image reconstructions of the cervical spine and maxillofacial structures were also generated. COMPARISON:  CT head 08/07/2014 FINDINGS: CT HEAD FINDINGS Diffuse cerebral atrophy. Ventricular dilatation consistent with central atrophy. Patchy low-attenuation changes in the deep white matter consistent with small vessel ischemia. Old white matter infarct in the left deep white matter. No mass effect or midline shift. No abnormal extra-axial fluid collections. Gray-white matter junctions are distinct. Basal cisterns are not effaced. No evidence of acute intracranial hemorrhage. No depressed skull fractures. Partial opacification of paranasal sinuses. Mastoid air cells are not opacified. Vascular calcifications. CT MAXILLOFACIAL FINDINGS The globes and extraocular muscles appear intact and symmetrical. Mucosal thickening in the ethmoid air cells. Prominent mucosal thickening in the right maxillary antrum. Air-fluid levels in the sphenoid sinuses. There appear to be acute nondisplaced fractures of the medial and lateral wall of the right maxillary antrum with mildly depressed  anterior nasal bone fractures. The orbital rims appear intact. Left maxillary antral walls appear intact. Mandibles, temporomandibular joints, zygomatic arches, and pterygoid plates appear intact. Soft tissues are unremarkable. CT CERVICAL SPINE FINDINGS Straightening of the usual cervical lordosis. This may be due to patient positioning but ligamentous injury or muscle spasm can also have this appearance and are not excluded. No anterior subluxation. Normal alignment of the facet joints. No vertebral compression deformities. No prevertebral soft tissue swelling. Intervertebral disc space heights are preserved. C1-2 articulation appears intact. No focal  bone lesion or bone destruction. Bony fluid and material demonstrated in the nasopharynx and oropharynx. This is likely inflammatory. IMPRESSION: No acute intracranial abnormalities. Chronic atrophy and small vessel ischemic changes. Nondisplaced fractures of the right maxillary antral walls and of the anterior nasal bones. Probable inflammatory mucosal thickening and fluid in the paranasal sinuses. Nonspecific straightening of usual cervical lordosis. No acute displaced fractures identified. Probable inflammatory secretions demonstrated in the nasopharynx. Electronically Signed   By: Lucienne Capers M.D.   On: 07/02/2015 05:26   Ct Cervical Spine Wo Contrast  07/02/2015  CLINICAL DATA:  Trip and fall injury. No loss of consciousness. Bruising and bleeding over the nose. EXAM: CT HEAD WITHOUT CONTRAST CT MAXILLOFACIAL WITHOUT CONTRAST CT CERVICAL SPINE WITHOUT CONTRAST TECHNIQUE: Multidetector CT imaging of the head, cervical spine, and maxillofacial structures were performed using the standard protocol without intravenous contrast. Multiplanar CT image reconstructions of the cervical spine and maxillofacial structures were also generated. COMPARISON:  CT head 08/07/2014 FINDINGS: CT HEAD FINDINGS Diffuse cerebral atrophy. Ventricular dilatation consistent with central atrophy. Patchy low-attenuation changes in the deep white matter consistent with small vessel ischemia. Old white matter infarct in the left deep white matter. No mass effect or midline shift. No abnormal extra-axial fluid collections. Gray-white matter junctions are distinct. Basal cisterns are not effaced. No evidence of acute intracranial hemorrhage. No depressed skull fractures. Partial opacification of paranasal sinuses. Mastoid air cells are not opacified. Vascular calcifications. CT MAXILLOFACIAL FINDINGS The globes and extraocular muscles appear intact and symmetrical. Mucosal thickening in the ethmoid air cells. Prominent mucosal  thickening in the right maxillary antrum. Air-fluid levels in the sphenoid sinuses. There appear to be acute nondisplaced fractures of the medial and lateral wall of the right maxillary antrum with mildly depressed anterior nasal bone fractures. The orbital rims appear intact. Left maxillary antral walls appear intact. Mandibles, temporomandibular joints, zygomatic arches, and pterygoid plates appear intact. Soft tissues are unremarkable. CT CERVICAL SPINE FINDINGS Straightening of the usual cervical lordosis. This may be due to patient positioning but ligamentous injury or muscle spasm can also have this appearance and are not excluded. No anterior subluxation. Normal alignment of the facet joints. No vertebral compression deformities. No prevertebral soft tissue swelling. Intervertebral disc space heights are preserved. C1-2 articulation appears intact. No focal bone lesion or bone destruction. Bony fluid and material demonstrated in the nasopharynx and oropharynx. This is likely inflammatory. IMPRESSION: No acute intracranial abnormalities. Chronic atrophy and small vessel ischemic changes. Nondisplaced fractures of the right maxillary antral walls and of the anterior nasal bones. Probable inflammatory mucosal thickening and fluid in the paranasal sinuses. Nonspecific straightening of usual cervical lordosis. No acute displaced fractures identified. Probable inflammatory secretions demonstrated in the nasopharynx. Electronically Signed   By: Lucienne Capers M.D.   On: 07/02/2015 05:26   Mr Brain Wo Contrast  07/12/2015  CLINICAL DATA:  Dizziness.  Diffuse weakness. EXAM: MRI HEAD WITHOUT CONTRAST TECHNIQUE: Multiplanar, multiecho pulse sequences of  the brain and surrounding structures were obtained without intravenous contrast. COMPARISON:  CT head without contrast 07/02/2015. FINDINGS: Extensive areas are restricted diffusion involves the medial left occipital lobe and parietal lobe as well as the left side  of the splenium of the corpus callosum. There scattered areas of restricted diffusion within the left thalamus. Findings are compatible with acute/ subacute nonhemorrhagic left PCA distribution infarct. T2 and FLAIR signal changes suggest a subacute time frame. No acute hemorrhage is associated. There is no mass lesion. Remote lacunar infarcts are present in the left basal ganglia and coronal radiata. Moderate periventricular T2 changes are advanced for age. There is moderate generalized atrophy. The ventricles are proportionate to the degree of atrophy. Mild white matter changes extend into the brainstem. The cerebellum is unremarkable. The internal auditory canals are within normal limits bilaterally. Flow is present in the major intracranial arteries. Bilateral lens replacements are present. The globes and orbits are otherwise unremarkable. Mild mucosal thickening is evident in the right maxillary sinus. The remaining paranasal sinuses and the mastoid air cells are clear. The skullbase is within normal limits. Midline sagittal images are unremarkable. IMPRESSION: 1. Acute/subacute left PCA territory infarct 2. Moderate atrophy and white matter disease likely reflects the sequela of chronic microvascular ischemia. 3. Remote lacunar infarcts involving the left lentiform nucleus and corona radiata. 4. Minimal right maxillary sinus disease. These results were called by telephone at the time of interpretation on 07/12/2015 at 8:22 pm to Dr. Janee Morn , who verbally acknowledged these results. Electronically Signed   By: San Morelle M.D.   On: 07/12/2015 20:41   Ct Maxillofacial Wo Cm  07/02/2015  CLINICAL DATA:  Trip and fall injury. No loss of consciousness. Bruising and bleeding over the nose. EXAM: CT HEAD WITHOUT CONTRAST CT MAXILLOFACIAL WITHOUT CONTRAST CT CERVICAL SPINE WITHOUT CONTRAST TECHNIQUE: Multidetector CT imaging of the head, cervical spine, and maxillofacial structures were performed  using the standard protocol without intravenous contrast. Multiplanar CT image reconstructions of the cervical spine and maxillofacial structures were also generated. COMPARISON:  CT head 08/07/2014 FINDINGS: CT HEAD FINDINGS Diffuse cerebral atrophy. Ventricular dilatation consistent with central atrophy. Patchy low-attenuation changes in the deep white matter consistent with small vessel ischemia. Old white matter infarct in the left deep white matter. No mass effect or midline shift. No abnormal extra-axial fluid collections. Gray-white matter junctions are distinct. Basal cisterns are not effaced. No evidence of acute intracranial hemorrhage. No depressed skull fractures. Partial opacification of paranasal sinuses. Mastoid air cells are not opacified. Vascular calcifications. CT MAXILLOFACIAL FINDINGS The globes and extraocular muscles appear intact and symmetrical. Mucosal thickening in the ethmoid air cells. Prominent mucosal thickening in the right maxillary antrum. Air-fluid levels in the sphenoid sinuses. There appear to be acute nondisplaced fractures of the medial and lateral wall of the right maxillary antrum with mildly depressed anterior nasal bone fractures. The orbital rims appear intact. Left maxillary antral walls appear intact. Mandibles, temporomandibular joints, zygomatic arches, and pterygoid plates appear intact. Soft tissues are unremarkable. CT CERVICAL SPINE FINDINGS Straightening of the usual cervical lordosis. This may be due to patient positioning but ligamentous injury or muscle spasm can also have this appearance and are not excluded. No anterior subluxation. Normal alignment of the facet joints. No vertebral compression deformities. No prevertebral soft tissue swelling. Intervertebral disc space heights are preserved. C1-2 articulation appears intact. No focal bone lesion or bone destruction. Bony fluid and material demonstrated in the nasopharynx and oropharynx. This is likely  inflammatory.  IMPRESSION: No acute intracranial abnormalities. Chronic atrophy and small vessel ischemic changes. Nondisplaced fractures of the right maxillary antral walls and of the anterior nasal bones. Probable inflammatory mucosal thickening and fluid in the paranasal sinuses. Nonspecific straightening of usual cervical lordosis. No acute displaced fractures identified. Probable inflammatory secretions demonstrated in the nasopharynx. Electronically Signed   By: Lucienne Capers M.D.   On: 07/02/2015 05:26    Microbiology: Recent Results (from the past 240 hour(s))  Culture, Urine     Status: Abnormal   Collection Time: 07/09/15  9:32 AM  Result Value Ref Range Status   Specimen Description URINE, CLEAN CATCH  Final   Special Requests NONE  Final   Culture MULTIPLE SPECIES PRESENT, SUGGEST RECOLLECTION (A)  Final   Report Status 07/11/2015 FINAL  Final     Labs: Basic Metabolic Panel:  Recent Labs Lab 07/12/15 0255 07/13/15 0258 07/14/15 0223 07/16/15 0812  NA 138 138 141 140  K 3.9 3.9 4.1 4.8  CL 107 106 106 106  CO2 21* 22 24 22   GLUCOSE 92 137* 101* 139*  BUN 14 15 15 16   CREATININE 1.18* 1.31* 1.30* 1.46*  CALCIUM 8.9 8.7* 8.9 9.6   Liver Function Tests: No results for input(s): AST, ALT, ALKPHOS, BILITOT, PROT, ALBUMIN in the last 168 hours. No results for input(s): LIPASE, AMYLASE in the last 168 hours. No results for input(s): AMMONIA in the last 168 hours. CBC:  Recent Labs Lab 07/12/15 0255 07/13/15 0258 07/14/15 0223 07/16/15 0812  WBC 8.0 7.5 6.8 7.0  NEUTROABS  --   --   --  3.9  HGB 9.2* 9.3* 9.1* 10.2*  HCT 27.9* 28.0* 28.6* 31.8*  MCV 89.7 90.0 89.9 90.3  PLT 367 379 388 377   Cardiac Enzymes: No results for input(s): CKTOTAL, CKMB, CKMBINDEX, TROPONINI in the last 168 hours. BNP: BNP (last 3 results)  Recent Labs  08/07/14 1344 09/09/14 1315  BNP 297.8* 118.6*    ProBNP (last 3 results) No results for input(s): PROBNP in the last  8760 hours.  CBG:  Recent Labs Lab 07/17/15 0647 07/17/15 1119 07/17/15 1725 07/17/15 2117 07/18/15 0611  GLUCAP 124* 137* 142* 201* 185*    Time coordinating discharge: 35 minutes  Signed:  Itay Mella  [facility] 07/18/2015, 9:59 AM

## 2015-07-18 NOTE — Care Management Important Message (Signed)
Important Message  Patient Details  Name: Maria Fox MRN: [id number] Date of Birth: 10/19/1945   Medicare Important Message Given:  Yes    Pollie Friar, RN 07/18/2015, 11:43 AM

## 2015-07-18 NOTE — Care Management Note (Addendum)
Case Management Note  Patient Details  Name: Maria Fox MRN: [id number] Date of Birth: 1946/03/06  Subjective/Objective:                    Action/Plan: Patient with orders for [facility] RN/PT/OT. CM met with the patient and her husband to provide them a list of [place] agencies in [place]. They both do not want home health services due to the renovations on their home and their attempting to move in the near future. She stated she will refuse anyone coming to their home. She asked to have Outpatient therapy. CM asked about [facility] and they were interested in attending. CM spoke with Dr Sheran Fava and she was in agreement for patient to attend outpatient therapy. CM placed orders in EPIC and information on the AVS. Bedside RN updated.   Expected Discharge Date:  07/11/15               Expected Discharge Plan:  [place]  In-House Referral:     Discharge planning Services  CM Consult  Post Acute Care Choice:    Choice offered to:  Patient, Spouse  DME Arranged:    DME Agency:     HH Arranged:   (none patient refused) [place] Agency:     Status of Service:  Completed, signed off  Medicare Important Message Given:    Date Medicare IM Given:    Medicare IM give by:    Date Additional Medicare IM Given:    Additional Medicare Important Message give by:     If discussed at [place] of Stay Meetings, dates discussed:    Additional Comments:  Pollie Friar, RN 07/18/2015, 10:34 AM

## 2015-07-18 NOTE — Progress Notes (Signed)
Discharge instructions reviewed with patient/family. All questions answered at this time. Transport home by spouse.   Ave Filter, RN

## 2015-07-19 ENCOUNTER — Telehealth: Payer: Self-pay | Admitting: *Deleted

## 2015-07-19 ENCOUNTER — Other Ambulatory Visit: Payer: Self-pay

## 2015-07-19 NOTE — Patient Outreach (Signed)
[facility]) Care Management  07/19/2015  Maria Fox 03/24/1945 [id number]  70 year old admitted 4/30-5/9 with fall, weakness, acute left stroke. RNCM called for transition of care. No answer. HIPPA compliant message left.  Plan: continue to attempt to reach member.  [name], RN, MSN, [place] Coordinator Cell: [phone]

## 2015-07-19 NOTE — Telephone Encounter (Signed)
Transitional care call attempted.  Left message for patient to return call. 

## 2015-07-20 ENCOUNTER — Other Ambulatory Visit: Payer: Self-pay

## 2015-07-20 LAB — ECHOCARDIOGRAM COMPLETE
HEIGHTINCHES: 62 in
WEIGHTICAEL: 2404 [oz_av]

## 2015-07-20 NOTE — Telephone Encounter (Signed)
Transitional care call attempted.  Voice mail box is full - unable to leave a message.  Will continue attempts to contact patient.

## 2015-07-20 NOTE — Patient Outreach (Signed)
[place] [facility]) Care Management  07/20/2015  Maria Fox 07-31-1945 [id number]  Transition of care call. RNCM called both listed numbers. One voice message was filled and a HIPPA compliant message left on mobile line.   Plan: continue to attempt to reach member.  [name], RN, MSN, [place] Coordinator Cell: [phone]

## 2015-07-21 ENCOUNTER — Other Ambulatory Visit: Payer: Self-pay

## 2015-07-21 NOTE — Telephone Encounter (Signed)
Transitional care call attempted.  Voice mail box is full - unable to leave a message.  Will continue attempts to contact patient.

## 2015-07-21 NOTE — Patient Outreach (Signed)
[facility]) Care Management  07/21/2015  REMONIA DIPIAZZA 1945-07-21 [id number]   Assessment: Transition of care call. No answer-voice message filled. 3rd attempt-unable to leave message.  Plan: send outreach letter.  [name], RN, MSN, [place] Coordinator Cell: [phone]

## 2015-07-24 NOTE — Telephone Encounter (Signed)
Unable to reach patient after multiple attempts.  No follow up scheduled.

## 2015-07-25 ENCOUNTER — Observation Stay (HOSPITAL_COMMUNITY)
Admission: EM | Admit: 2015-07-25 | Discharge: 2015-07-27 | Disposition: A | Discharge status: Home / Self Care | Payer: Medicare Other | Attending: Internal Medicine | Admitting: Internal Medicine

## 2015-07-25 ENCOUNTER — Encounter (HOSPITAL_COMMUNITY): Payer: Self-pay | Admitting: *Deleted

## 2015-07-25 ENCOUNTER — Telehealth: Payer: Self-pay

## 2015-07-25 ENCOUNTER — Emergency Department (HOSPITAL_COMMUNITY): Payer: Medicare Other

## 2015-07-25 DIAGNOSIS — I951 Orthostatic hypotension: Principal | ICD-10-CM | POA: Diagnosis present

## 2015-07-25 DIAGNOSIS — R4584 Anhedonia: Secondary | ICD-10-CM | POA: Insufficient documentation

## 2015-07-25 DIAGNOSIS — F331 Major depressive disorder, recurrent, moderate: Secondary | ICD-10-CM | POA: Diagnosis not present

## 2015-07-25 DIAGNOSIS — I252 Old myocardial infarction: Secondary | ICD-10-CM | POA: Diagnosis not present

## 2015-07-25 DIAGNOSIS — Z91018 Allergy to other foods: Secondary | ICD-10-CM | POA: Insufficient documentation

## 2015-07-25 DIAGNOSIS — E611 Iron deficiency: Secondary | ICD-10-CM | POA: Diagnosis present

## 2015-07-25 DIAGNOSIS — I5032 Chronic diastolic (congestive) heart failure: Secondary | ICD-10-CM | POA: Diagnosis present

## 2015-07-25 DIAGNOSIS — I4581 Long QT syndrome: Secondary | ICD-10-CM | POA: Diagnosis not present

## 2015-07-25 DIAGNOSIS — Z7902 Long term (current) use of antithrombotics/antiplatelets: Secondary | ICD-10-CM | POA: Diagnosis not present

## 2015-07-25 DIAGNOSIS — I130 Hypertensive heart and chronic kidney disease with heart failure and stage 1 through stage 4 chronic kidney disease, or unspecified chronic kidney disease: Secondary | ICD-10-CM | POA: Insufficient documentation

## 2015-07-25 DIAGNOSIS — K219 Gastro-esophageal reflux disease without esophagitis: Secondary | ICD-10-CM | POA: Insufficient documentation

## 2015-07-25 DIAGNOSIS — Z885 Allergy status to narcotic agent status: Secondary | ICD-10-CM | POA: Diagnosis not present

## 2015-07-25 DIAGNOSIS — I69398 Other sequelae of cerebral infarction: Secondary | ICD-10-CM | POA: Diagnosis not present

## 2015-07-25 DIAGNOSIS — F329 Major depressive disorder, single episode, unspecified: Secondary | ICD-10-CM

## 2015-07-25 DIAGNOSIS — Z7982 Long term (current) use of aspirin: Secondary | ICD-10-CM | POA: Diagnosis not present

## 2015-07-25 DIAGNOSIS — Z880 Allergy status to penicillin: Secondary | ICD-10-CM | POA: Insufficient documentation

## 2015-07-25 DIAGNOSIS — D509 Iron deficiency anemia, unspecified: Secondary | ICD-10-CM | POA: Insufficient documentation

## 2015-07-25 DIAGNOSIS — R2689 Other abnormalities of gait and mobility: Secondary | ICD-10-CM | POA: Diagnosis not present

## 2015-07-25 DIAGNOSIS — I2510 Atherosclerotic heart disease of native coronary artery without angina pectoris: Secondary | ICD-10-CM | POA: Diagnosis not present

## 2015-07-25 DIAGNOSIS — R079 Chest pain, unspecified: Secondary | ICD-10-CM | POA: Insufficient documentation

## 2015-07-25 DIAGNOSIS — F332 Major depressive disorder, recurrent severe without psychotic features: Secondary | ICD-10-CM | POA: Diagnosis present

## 2015-07-25 DIAGNOSIS — N183 Chronic kidney disease, stage 3 (moderate): Secondary | ICD-10-CM | POA: Diagnosis not present

## 2015-07-25 DIAGNOSIS — I10 Essential (primary) hypertension: Secondary | ICD-10-CM | POA: Diagnosis present

## 2015-07-25 DIAGNOSIS — R51 Headache: Secondary | ICD-10-CM | POA: Diagnosis not present

## 2015-07-25 DIAGNOSIS — F333 Major depressive disorder, recurrent, severe with psychotic symptoms: Secondary | ICD-10-CM

## 2015-07-25 DIAGNOSIS — E1122 Type 2 diabetes mellitus with diabetic chronic kidney disease: Secondary | ICD-10-CM | POA: Diagnosis not present

## 2015-07-25 DIAGNOSIS — E785 Hyperlipidemia, unspecified: Secondary | ICD-10-CM | POA: Insufficient documentation

## 2015-07-25 DIAGNOSIS — E1169 Type 2 diabetes mellitus with other specified complication: Secondary | ICD-10-CM | POA: Diagnosis present

## 2015-07-25 DIAGNOSIS — Z881 Allergy status to other antibiotic agents status: Secondary | ICD-10-CM | POA: Diagnosis not present

## 2015-07-25 DIAGNOSIS — F32A Depression, unspecified: Secondary | ICD-10-CM

## 2015-07-25 DIAGNOSIS — Z882 Allergy status to sulfonamides status: Secondary | ICD-10-CM | POA: Diagnosis not present

## 2015-07-25 HISTORY — DX: Orthostatic hypotension: I95.1

## 2015-07-25 LAB — COMPREHENSIVE METABOLIC PANEL
ALK PHOS: 68 U/L (ref 38–126)
ALT: 12 U/L — AB (ref 14–54)
AST: 19 U/L (ref 15–41)
Albumin: 3.8 g/dL (ref 3.5–5.0)
Anion gap: 7 (ref 5–15)
BUN: 10 mg/dL (ref 6–20)
CALCIUM: 9.3 mg/dL (ref 8.9–10.3)
CO2: 21 mmol/L — AB (ref 22–32)
CREATININE: 1.23 mg/dL — AB (ref 0.44–1.00)
Chloride: 107 mmol/L (ref 101–111)
GFR, EST AFRICAN AMERICAN: 50 mL/min — AB (ref >60)
GFR, EST NON AFRICAN AMERICAN: 43 mL/min — AB (ref >60)
Glucose, Bld: 154 mg/dL — ABNORMAL HIGH (ref 65–99)
Potassium: 3.8 mmol/L (ref 3.5–5.1)
Sodium: 135 mmol/L (ref 135–145)
Total Bilirubin: 0.4 mg/dL (ref 0.3–1.2)
Total Protein: 7 g/dL (ref 6.5–8.1)

## 2015-07-25 LAB — I-STAT CHEM 8, ED
BUN: 11 mg/dL (ref 6–20)
CHLORIDE: 107 mmol/L (ref 101–111)
CREATININE: 1.1 mg/dL — AB (ref 0.44–1.00)
Calcium, Ion: 1.18 mmol/L (ref 1.13–1.30)
Glucose, Bld: 150 mg/dL — ABNORMAL HIGH (ref 65–99)
HCT: 37 % (ref 36.0–46.0)
Hemoglobin: 12.6 g/dL (ref 12.0–15.0)
Potassium: 3.9 mmol/L (ref 3.5–5.1)
Sodium: 140 mmol/L (ref 135–145)
TCO2: 20 mmol/L (ref 0–100)

## 2015-07-25 LAB — CBC
HEMATOCRIT: 33.9 % — AB (ref 36.0–46.0)
HEMOGLOBIN: 11.3 g/dL — AB (ref 12.0–15.0)
MCH: 29.6 pg (ref 26.0–34.0)
MCHC: 33.3 g/dL (ref 30.0–36.0)
MCV: 88.7 fL (ref 78.0–100.0)
Platelets: 304 10*3/uL (ref 150–400)
RBC: 3.82 MIL/uL — AB (ref 3.87–5.11)
RDW: 13.8 % (ref 11.5–15.5)
WBC: 6.2 10*3/uL (ref 4.0–10.5)

## 2015-07-25 LAB — URINE MICROSCOPIC-ADD ON

## 2015-07-25 LAB — URINALYSIS, ROUTINE W REFLEX MICROSCOPIC
Bilirubin Urine: NEGATIVE
GLUCOSE, UA: NEGATIVE mg/dL
Hgb urine dipstick: NEGATIVE
Ketones, ur: NEGATIVE mg/dL
NITRITE: NEGATIVE
PH: 5.5 (ref 5.0–8.0)
Protein, ur: NEGATIVE mg/dL
SPECIFIC GRAVITY, URINE: 1.02 (ref 1.005–1.030)

## 2015-07-25 LAB — PROTIME-INR
INR: 1.05 (ref 0.00–1.49)
Prothrombin Time: 13.9 seconds (ref 11.6–15.2)

## 2015-07-25 LAB — I-STAT TROPONIN, ED: TROPONIN I, POC: 0.01 ng/mL (ref 0.00–0.08)

## 2015-07-25 LAB — DIFFERENTIAL
Basophils Absolute: 0.1 10*3/uL (ref 0.0–0.1)
Basophils Relative: 1 %
Eosinophils Absolute: 0.3 10*3/uL (ref 0.0–0.7)
Eosinophils Relative: 5 %
LYMPHS ABS: 1.6 10*3/uL (ref 0.7–4.0)
LYMPHS PCT: 26 %
MONO ABS: 0.5 10*3/uL (ref 0.1–1.0)
MONOS PCT: 8 %
NEUTROS ABS: 3.7 10*3/uL (ref 1.7–7.7)
Neutrophils Relative %: 60 %

## 2015-07-25 LAB — APTT: aPTT: 26 seconds (ref 24–37)

## 2015-07-25 MED ORDER — SODIUM CHLORIDE 0.9 % IV BOLUS (SEPSIS)
1000.0000 mL | Freq: Once | INTRAVENOUS | Status: AC
  Administered 2015-07-25: 1000 mL via INTRAVENOUS

## 2015-07-25 NOTE — Telephone Encounter (Signed)
Scheduled appt tomorrow morning with me at 11am however patient stated she could not wait.  She endorsed acutely worsening weakness over last few day associated with L sided weakness leading to inability to ambulate - new onset per patient.  Recently admitted for L posterior circulation CVA and AKI - recommended ER eval today to r/o recurrent CVA/AKI.
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
## 2015-07-25 NOTE — Telephone Encounter (Signed)
Pt came to office in w/c; pt is weak,dizzy, nauseated and feeling depressed; no SI/HI. Pt stated recent stroke affected pts vision and balance. For one 1 week pt having problems walking and today more weakness in lt side.  97.3 - P 99 - BP 120/80 in rt arm reg cuff and pulse ox 97% room air. Pt recently d/c from hospital. Pt said she feels like she needs to be seen today. Dr [name] advised pt should go to ED now for eval since condition worsening and pt scheduled 30 min appt 07/26/15 at 11 AM to see Dr [name]. If pt gets admitted Mr Busko will call and cancel 07/26/15 appt. FYI to Dr [name].

## 2015-07-25 NOTE — ED Notes (Signed)
Pt c/o generalized weakness, unsteady gait, & dizziness onset x 1 wk, pt reports recent hospitalization, pt states,  " I had mini strokes while I was in here." pt seen PCP today & was sent here for further work up, pt A&O x4, follows commands, speaks in complete sentences, pt takes Plavix, pt denies CP & SOB, n/v/d, pt c/o blurred vision

## 2015-07-26 ENCOUNTER — Ambulatory Visit: Payer: Medicare Other | Admitting: Family Medicine

## 2015-07-26 ENCOUNTER — Encounter (HOSPITAL_COMMUNITY): Payer: Self-pay | Admitting: General Practice

## 2015-07-26 DIAGNOSIS — D509 Iron deficiency anemia, unspecified: Secondary | ICD-10-CM | POA: Diagnosis not present

## 2015-07-26 DIAGNOSIS — I951 Orthostatic hypotension: Secondary | ICD-10-CM | POA: Diagnosis not present

## 2015-07-26 DIAGNOSIS — R4584 Anhedonia: Secondary | ICD-10-CM

## 2015-07-26 DIAGNOSIS — F331 Major depressive disorder, recurrent, moderate: Secondary | ICD-10-CM

## 2015-07-26 LAB — CBC
HCT: 29.6 % — ABNORMAL LOW (ref 36.0–46.0)
HEMOGLOBIN: 9.6 g/dL — AB (ref 12.0–15.0)
MCH: 28.6 pg (ref 26.0–34.0)
MCHC: 32.4 g/dL (ref 30.0–36.0)
MCV: 88.1 fL (ref 78.0–100.0)
Platelets: 256 10*3/uL (ref 150–400)
RBC: 3.36 MIL/uL — AB (ref 3.87–5.11)
RDW: 13.6 % (ref 11.5–15.5)
WBC: 6.3 10*3/uL (ref 4.0–10.5)

## 2015-07-26 LAB — COMPREHENSIVE METABOLIC PANEL
ALBUMIN: 3.3 g/dL — AB (ref 3.5–5.0)
ALK PHOS: 58 U/L (ref 38–126)
ALT: 10 U/L — AB (ref 14–54)
ANION GAP: 10 (ref 5–15)
AST: 18 U/L (ref 15–41)
BUN: 11 mg/dL (ref 6–20)
CALCIUM: 8.8 mg/dL — AB (ref 8.9–10.3)
CHLORIDE: 110 mmol/L (ref 101–111)
CO2: 19 mmol/L — AB (ref 22–32)
CREATININE: 1.1 mg/dL — AB (ref 0.44–1.00)
GFR calc Af Amer: 57 mL/min — ABNORMAL LOW (ref >60)
GFR calc non Af Amer: 49 mL/min — ABNORMAL LOW (ref >60)
GLUCOSE: 124 mg/dL — AB (ref 65–99)
Potassium: 3.8 mmol/L (ref 3.5–5.1)
SODIUM: 139 mmol/L (ref 135–145)
Total Bilirubin: 0.5 mg/dL (ref 0.3–1.2)
Total Protein: 6 g/dL — ABNORMAL LOW (ref 6.5–8.1)

## 2015-07-26 LAB — TYPE AND SCREEN
ABO/RH(D): O POS
Antibody Screen: NEGATIVE

## 2015-07-26 LAB — HEMOGLOBIN AND HEMATOCRIT, BLOOD
HCT: 29.4 % — ABNORMAL LOW (ref 36.0–46.0)
Hemoglobin: 9.8 g/dL — ABNORMAL LOW (ref 12.0–15.0)

## 2015-07-26 LAB — ABO/RH: ABO/RH(D): O POS

## 2015-07-26 LAB — GLUCOSE, CAPILLARY
GLUCOSE-CAPILLARY: 176 mg/dL — AB (ref 65–99)
GLUCOSE-CAPILLARY: 114 mg/dL — AB (ref 65–99)

## 2015-07-26 LAB — TSH: TSH: 1.355 u[IU]/mL (ref 0.350–4.500)

## 2015-07-26 LAB — TROPONIN I: TROPONIN I: 0.03 ng/mL (ref <0.031)

## 2015-07-26 MED ORDER — CLOPIDOGREL BISULFATE 75 MG PO TABS
75.0000 mg | ORAL_TABLET | Freq: Every day | ORAL | Status: DC
  Administered 2015-07-26 – 2015-07-27 (×2): 75 mg via ORAL
  Filled 2015-07-26 (×2): qty 1

## 2015-07-26 MED ORDER — SERTRALINE HCL 50 MG PO TABS
75.0000 mg | ORAL_TABLET | Freq: Every day | ORAL | Status: DC
  Administered 2015-07-26: 75 mg via ORAL
  Filled 2015-07-26: qty 2

## 2015-07-26 MED ORDER — MECLIZINE HCL 12.5 MG PO TABS: 12.5000 mg | ORAL_TABLET | Freq: Two times a day (BID) | ORAL | Status: DC | PRN

## 2015-07-26 MED ORDER — FERROUS SULFATE 325 (65 FE) MG PO TABS
325.0000 mg | ORAL_TABLET | Freq: Every day | ORAL | Status: DC
  Administered 2015-07-26 – 2015-07-27 (×2): 325 mg via ORAL
  Filled 2015-07-26 (×2): qty 1

## 2015-07-26 MED ORDER — DULOXETINE HCL 30 MG PO CPEP
30.0000 mg | ORAL_CAPSULE | Freq: Every day | ORAL | Status: DC
  Administered 2015-07-27: 30 mg via ORAL
  Filled 2015-07-26: qty 1

## 2015-07-26 MED ORDER — METOCLOPRAMIDE HCL 10 MG PO TABS: 10.0000 mg | ORAL_TABLET | Freq: Four times a day (QID) | ORAL | Status: DC | PRN

## 2015-07-26 MED ORDER — ALBUTEROL SULFATE (2.5 MG/3ML) 0.083% IN NEBU: 2.5000 mg | INHALATION_SOLUTION | RESPIRATORY_TRACT | Status: DC | PRN

## 2015-07-26 MED ORDER — MIDODRINE HCL 5 MG PO TABS
10.0000 mg | ORAL_TABLET | Freq: Three times a day (TID) | ORAL | Status: DC
  Administered 2015-07-26 – 2015-07-27 (×5): 10 mg via ORAL
  Filled 2015-07-26 (×5): qty 2

## 2015-07-26 MED ORDER — ASPIRIN EC 81 MG PO TBEC
81.0000 mg | DELAYED_RELEASE_TABLET | Freq: Every day | ORAL | Status: DC
  Administered 2015-07-26 – 2015-07-27 (×2): 81 mg via ORAL
  Filled 2015-07-26 (×2): qty 1

## 2015-07-26 MED ORDER — DOCUSATE SODIUM 100 MG PO CAPS
100.0000 mg | ORAL_CAPSULE | Freq: Two times a day (BID) | ORAL | Status: DC
  Administered 2015-07-26 – 2015-07-27 (×3): 100 mg via ORAL
  Filled 2015-07-26 (×3): qty 1

## 2015-07-26 MED ORDER — METOPROLOL TARTRATE 25 MG PO TABS
12.5000 mg | ORAL_TABLET | Freq: Two times a day (BID) | ORAL | Status: DC
  Administered 2015-07-26 – 2015-07-27 (×3): 12.5 mg via ORAL
  Filled 2015-07-26 (×3): qty 1

## 2015-07-26 MED ORDER — ENOXAPARIN SODIUM 40 MG/0.4ML ~~LOC~~ SOLN
40.0000 mg | Freq: Every day | SUBCUTANEOUS | Status: DC
  Administered 2015-07-26 – 2015-07-27 (×2): 40 mg via SUBCUTANEOUS
  Filled 2015-07-26 (×2): qty 0.4

## 2015-07-26 MED ORDER — HYDROCORTISONE 1 % EX CREA
1.0000 "application " | TOPICAL_CREAM | Freq: Two times a day (BID) | CUTANEOUS | Status: DC
  Filled 2015-07-26: qty 28

## 2015-07-26 MED ORDER — ACETAMINOPHEN 325 MG PO TABS: 650.0000 mg | ORAL_TABLET | Freq: Four times a day (QID) | ORAL | Status: DC | PRN

## 2015-07-26 MED ORDER — ATORVASTATIN CALCIUM 40 MG PO TABS
40.0000 mg | ORAL_TABLET | Freq: Every day | ORAL | Status: DC
  Administered 2015-07-26: 40 mg via ORAL
  Filled 2015-07-26: qty 1

## 2015-07-26 MED ORDER — ONDANSETRON 4 MG PO TBDP: 4.0000 mg | ORAL_TABLET | Freq: Three times a day (TID) | ORAL | Status: DC | PRN

## 2015-07-26 MED ORDER — GABAPENTIN 100 MG PO CAPS
100.0000 mg | ORAL_CAPSULE | Freq: Three times a day (TID) | ORAL | Status: DC
  Administered 2015-07-26 – 2015-07-27 (×4): 100 mg via ORAL
  Filled 2015-07-26 (×4): qty 1

## 2015-07-26 MED ORDER — ONDANSETRON HCL 4 MG PO TABS: 4.0000 mg | ORAL_TABLET | Freq: Four times a day (QID) | ORAL | Status: DC | PRN

## 2015-07-26 MED ORDER — VITAMIN B-12 1000 MCG PO TABS
1000.0000 ug | ORAL_TABLET | Freq: Every day | ORAL | Status: DC
  Administered 2015-07-26 – 2015-07-27 (×2): 1000 ug via ORAL
  Filled 2015-07-26 (×2): qty 1

## 2015-07-26 MED ORDER — ONDANSETRON HCL 4 MG/2ML IJ SOLN
4.0000 mg | Freq: Four times a day (QID) | INTRAMUSCULAR | Status: DC | PRN
Start: 2015-07-26 — End: 2015-07-27

## 2015-07-26 MED ORDER — SODIUM CHLORIDE 0.9 % IV SOLN
INTRAVENOUS | Status: DC
  Administered 2015-07-26 – 2015-07-27 (×3): via INTRAVENOUS

## 2015-07-26 MED ORDER — INSULIN ASPART 100 UNIT/ML ~~LOC~~ SOLN
0.0000 [IU] | Freq: Three times a day (TID) | SUBCUTANEOUS | Status: DC
  Administered 2015-07-26: 2 [IU] via SUBCUTANEOUS
  Administered 2015-07-27: 1 [IU] via SUBCUTANEOUS

## 2015-07-26 MED ORDER — INSULIN ASPART 100 UNIT/ML ~~LOC~~ SOLN: 0.0000 [IU] | Freq: Every day | SUBCUTANEOUS | Status: DC

## 2015-07-26 MED ORDER — ACETAMINOPHEN 650 MG RE SUPP: 650.0000 mg | Freq: Four times a day (QID) | RECTAL | Status: DC | PRN

## 2015-07-26 MED ORDER — INSULIN ASPART 100 UNIT/ML ~~LOC~~ SOLN: 0.0000 [IU] | Freq: Three times a day (TID) | SUBCUTANEOUS | Status: DC

## 2015-07-26 MED ORDER — HYDROXYZINE HCL 10 MG PO TABS
10.0000 mg | ORAL_TABLET | Freq: Three times a day (TID) | ORAL | Status: DC | PRN
  Filled 2015-07-26: qty 1

## 2015-07-26 MED ORDER — FOLIC ACID 1 MG PO TABS
1.0000 mg | ORAL_TABLET | Freq: Every day | ORAL | Status: DC
Start: 2015-07-26 — End: 2015-07-27
  Administered 2015-07-26 – 2015-07-27 (×2): 1 mg via ORAL
  Filled 2015-07-26 (×2): qty 1

## 2015-07-26 NOTE — Evaluation (Signed)
Occupational Therapy Evaluation and Discharge Patient Details Name: Maria Fox MRN: [id number] DOB: August 17, 1945 Today's Date: 07/26/2015    History of Present Illness Maria Fox is a 70 y.o. female with medical history significant of HTN, vitiligo, diabetes mellitus type 2, MI in [id number], diastolic dysfunction last EF 55-60%, CVA, CAD; who presents with acutely worsened fatigue and weakness. Husband helps provide additional history. Husband reports that the patient's been having issues with her balance more lately although she has not recently fallen. Associated symptoms include she is has been getting more easily confused, sleeping more, decreased appetite, and has no interest in things that she once did. Husband states that some of these symptoms were present before, but in the last week and it's been acutely worse. He states that she has a history of orthostatic hypotension, but is unsure of the patient's been taking her medications as she is supposed to. Patient just had recently been admitted for a acute left PCA stroke on 4/30 and discharged on 5/9 with outpatient PT and OT. Lastly patient complained of some difficulty in focusing vision and chest pain that is sharp in nature and intermittently comes and goes. Denies any shortness of breath, blood in stool/urine, or diarrhea.   Clinical Impression   Pt was performing ADL and mobility independently since her recent hospital d/c. Significant other assisted with IADL. Pt moving well, but requires min guard assist due to symptomatic orthostasis. Recommended pt use her tub bench at home and for significant other to provide close supervision for safety. Anticipate pt will return to baseline when medical issues resolve. No further OT needs.    Follow Up Recommendations  No OT follow up;Supervision/Assistance - 24 hour    Equipment Recommendations  None recommended by OT    Recommendations for Other Services       Precautions / Restrictions  Precautions Precautions: Fall Precaution Comments: watch BP Restrictions Weight Bearing Restrictions: No      Mobility Bed Mobility Overal bed mobility: Modified Independent                Transfers Overall transfer level: Needs assistance Equipment used: None Transfers: Sit to/from Stand Sit to Stand: Supervision         General transfer comment: verbal cues for hand placement, min guard for safety due to "wooziness"     Balance Overall balance assessment: Needs assistance Sitting-balance support: No upper extremity supported;Feet supported Sitting balance-Leahy Scale: Good     Standing balance support: During functional activity;Bilateral upper extremity supported Standing balance-Leahy Scale: Poor Standing balance comment: Pt unsteady due to dizzy and orthostatic                            ADL Overall ADL's : Needs assistance/impaired Eating/Feeding: Set up;Sitting   Grooming: Wash/dry hands;Sitting;Supervision/safety   Upper Body Bathing: Supervision/ safety;Sitting   Lower Body Bathing: Min guard   Upper Body Dressing : Sitting;Supervision/safety   Lower Body Dressing: Min guard;Sit to/from stand   Toilet Transfer: Supervision/safety;Stand-pivot;BSC   Toileting- Water quality scientist and Hygiene: Supervision/safety;Sit to/from stand         General ADL Comments: Recommended pt resume use of her tub bench and significant other to closely monitor pt when she is ambulating/transferring.     Vision Additional Comments: pt reports R eye vision changes, but cannot describe, fields, acuity and oculomotor intact   Perception     Praxis      Pertinent Vitals/Pain Pain  Assessment: No/denies pain     Hand Dominance Right   Extremity/Trunk Assessment Upper Extremity Assessment Upper Extremity Assessment: Overall WFL for tasks assessed   Lower Extremity Assessment Lower Extremity Assessment: Overall WFL for tasks assessed    Cervical / Trunk Assessment Cervical / Trunk Assessment: Normal   Communication Communication Communication: No difficulties   Cognition Arousal/Alertness: Awake/alert Behavior During Therapy: WFL for tasks assessed/performed;Flat affect Overall Cognitive Status: Within Functional Limits for tasks assessed                     General Comments       Exercises Exercises: General Lower Extremity     Shoulder Instructions      Home Living Family/patient expects to be discharged to:: Private residence Living Arrangements: Spouse/significant other Available Help at Discharge: Family;Available 24 hours/day Type of Home: House Home Access: Stairs to enter [employer] of Steps: 2   Home Layout: One level     Bathroom Shower/Tub: Tub/shower unit;Curtain   Bathroom Toilet: Standard Bathroom Accessibility: Yes   Home Equipment: Environmental consultant - 2 wheels;Cane - single point;Bedside commode;Walker - 4 wheels;Tub bench      Lives With: Significant other    Prior Functioning/Environment Level of Independence: Independent        Comments: Was not using equipment PTA    OT Diagnosis: Generalized weakness   OT Problem List:     OT Treatment/Interventions:      OT Goals(Current goals can be found in the care plan section) Acute Rehab OT Goals Patient Stated Goal: not stated  OT Frequency:     Barriers to D/C:            Co-evaluation              End of Session Equipment Utilized During Treatment: Gait belt  Activity Tolerance: Treatment limited secondary to medical complications (Comment) (pt with symptomatic orthostasis) Patient left: in bed;with call bell/phone within reach;with family/visitor present   Time: 1310-1330 OT Time Calculation (min): 20 min Charges:  OT General Charges $OT Visit: 1 Procedure OT Evaluation $OT Eval Moderate Complexity: 1 Procedure G-Codes: OT G-codes **NOT FOR INPATIENT CLASS** Functional Assessment Tool Used:  clinical judgement Functional Limitation: Self care Self Care Current Status [id number]): At least 1 percent but less than 20 percent impaired, limited or restricted Self Care Discharge Status [phone]): At least 1 percent but less than 20 percent impaired, limited or restricted  Maria Fox 07/26/2015, 1:46 PM [phone]

## 2015-07-26 NOTE — Evaluation (Signed)
Physical Therapy Evaluation Patient Details Name: Maria Fox MRN: [id number] DOB: 10-18-1945 Today's Date: 07/26/2015   History of Present Illness  Maria Fox is a 70 y.o. female with medical history significant of HTN, vitiligo, diabetes mellitus type 2, MI in [id number], diastolic dysfunction last EF 55-60%, CVA, CAD; who presents with acutely worsened fatigue and weakness. Husband helps provide additional history. Husband reports that the patient's been having issues with her balance more lately although she has not recently fallen. Associated symptoms include she is has been getting more easily confused, sleeping more, decreased appetite, and has no interest in things that she once did. Husband states that some of these symptoms were present before, but in the last week and it's been acutely worse. He states that she has a history of orthostatic hypotension, but is unsure of the patient's been taking her medications as she is supposed to. Patient just had recently been admitted for a acute left PCA stroke on 4/30 and discharged on 5/9 with outpatient PT and OT. Lastly patient complained of some difficulty in focusing vision and chest pain that is sharp in nature and intermittently comes and goes. Denies any shortness of breath, blood in stool/urine, or diarrhea.  Clinical Impression  Pt admitted with above diagnosis. Pt currently with functional limitations due to the deficits listed below (see PT Problem List). Pt was orthostatic therfore treatment limited as assisted pt back to supine.  Will follow as able.   Pt will benefit from skilled PT to increase their independence and safety with mobility to allow discharge to the venue listed below.    Follow Up Recommendations Home health PT;Supervision for mobility/OOB    Equipment Recommendations  None recommended by PT    Recommendations for Other Services       Precautions / Restrictions Precautions Precautions: Fall Precaution Comments: watch  BP Restrictions Weight Bearing Restrictions: No      Mobility  Bed Mobility Overal bed mobility: Modified Independent                Transfers Overall transfer level: Needs assistance Equipment used: None Transfers: Sit to/from Stand Sit to Stand: Supervision         General transfer comment: verbal cues for hand placement, min guard for safety due to "wooziness"   Ambulation/Gait                Stairs            Wheelchair Mobility    Modified Rankin (Stroke Patients Only)       Balance Overall balance assessment: Needs assistance Sitting-balance support: No upper extremity supported;Feet supported Sitting balance-Leahy Scale: Good     Standing balance support: During functional activity;Bilateral upper extremity supported Standing balance-Leahy Scale: Poor Standing balance comment: Pt unsteady due to dizzy and orthostatic                             Pertinent Vitals/Pain Pain Assessment: No/denies pain  Orthostatic BPs  Supine 141/65, 85 bpm  Sitting 107/81, 92 bpm  Standing 80/59, 98 bpm  Standing after 3 min Unable to take due to symptomatic      Home Living Family/patient expects to be discharged to:: Private residence Living Arrangements: Spouse/significant other Available Help at Discharge: Family;Available 24 hours/day Type of Home: House Home Access: Stairs to enter   [employer] of Steps: 2 Home Layout: One level Home Equipment: Walker - 2 wheels;Cane - single  point;Bedside commode;Shower seat;Walker - 4 wheels      Prior Function Level of Independence: Independent         Comments: Was not using equipment PTA     Hand Dominance   Dominant Hand: Right    Extremity/Trunk Assessment   Upper Extremity Assessment: Defer to OT evaluation           Lower Extremity Assessment: Overall WFL for tasks assessed      Cervical / Trunk Assessment: Normal  Communication   Communication:  No difficulties  Cognition Arousal/Alertness: Awake/alert Behavior During Therapy: WFL for tasks assessed/performed;Flat affect Overall Cognitive Status: Within Functional Limits for tasks assessed                      General Comments      Exercises General Exercises - Lower Extremity Ankle Circles/Pumps: AROM;Both;5 reps;Supine Heel Slides: AROM;Both;10 reps;Supine      Assessment/Plan    PT Assessment Patient needs continued PT services  PT Diagnosis Difficulty walking;Generalized weakness   PT Problem List Decreased strength;Decreased activity tolerance;Decreased balance;Decreased mobility;Cardiopulmonary status limiting activity  PT Treatment Interventions DME instruction;Gait training;Functional mobility training;Therapeutic activities;Therapeutic exercise;Balance training;Patient/family education   PT Goals (Current goals can be found in the Care Plan section) Acute Rehab PT Goals Patient Stated Goal: not stated PT Goal Formulation: With patient/family Time For Goal Achievement: 08/09/15 Potential to Achieve Goals: Good    Frequency Min 3X/week   Barriers to discharge        Co-evaluation               End of Session Equipment Utilized During Treatment: Gait belt Activity Tolerance: Patient limited by fatigue;Other (comment) (limited by orthostasis.) Patient left: in bed;with call bell/phone within reach;with family/visitor present Nurse Communication: Mobility status    Functional Assessment Tool Used: clinical judgement Functional Limitation: Mobility: Walking and moving around Mobility: Walking and Moving Around Current Status [id number]): 100 percent impaired, limited or restricted Mobility: Walking and Moving Around Goal Status [id number]): At least 1 percent but less than 20 percent impaired, limited or restricted    Time: [id number] PT Time Calculation (min) (ACUTE ONLY): 15 min   Charges:   PT Evaluation $PT Eval Moderate Complexity: 1  Procedure     PT G Codes:   PT G-Codes **NOT FOR INPATIENT CLASS** Functional Assessment Tool Used: clinical judgement Functional Limitation: Mobility: Walking and moving around Mobility: Walking and Moving Around Current Status [id number]): 100 percent impaired, limited or restricted Mobility: Walking and Moving Around Goal Status [id number]): At least 1 percent but less than 20 percent impaired, limited or restricted    Denice Paradise 07/26/2015, 1:33 PM Vance Erland Vivas,PT Acute Rehabilitation [phone] (pager)

## 2015-07-26 NOTE — Progress Notes (Signed)
[facility] PROGRESS NOTE  Maria Fox [name] DOB: 08-Sep-1945 DOA: 07/25/2015  PCP: [name], MD  Brief HPI: 70 year old Caucasian female with a past medical history of hypertension, vitiligo, diabetes, coronary artery disease, stroke, presented from home with complaints of fatigue and weakness. Patient was noted to be orthostatic in the emergency department. Also, history of decreased appetite, increased somnolence, decreased interest in activities in which she was previously taking part.  Past medical history:  Past Medical History  Diagnosis Date  . Hypertension   . Multiple allergies     mold, wool, dust, feathers  . Angioedema   . HLD (hyperlipidemia)   . Hx of migraines     remote  . S/P lens implant     left side ([name])  . Vitiligo   . History of chicken pox   . Dermatitis     eval [name] 2011: eczema, eval [name] 2011: bx negative for lichen simplex or derm herpetiformis  . Autoimmune deficiency syndrome ([place])   . UTI (urinary tract infection) 06/2014  . CVA (cerebral infarction) 07/2014    bilateral corona radiata - periCABG  . HCAP (healthcare-associated pneumonia) 06/2014  . DM (diabetes mellitus), type 2, uncontrolled w/neurologic complication ([place]) [id number]    ?autonomic neuropathy, gastroparesis (06/2014)   . Diastolic CHF ([place]) [id number]  . Carotid arterial disease ([name])     <50% bilaterally  . CAD (coronary artery disease), native coronary artery 06/2014    s/p NSTEMI with cath showing severe diffuse disease of the mid LAD, occluded large diagonal, 80-90% prox OM and normal dominant RCA.   . Maxillary fracture ([place])   . Orthostatic hypotension 07/2015  . Myocardial infarction [facility]) 2016    Consultants: Psychiatry  Procedures: None  Antibiotics: None  Subjective: Patient denies any pain or headaches. Does admit to feeling very depressed recently. Denies any suicidal ideation. Her significant other is at the bedside. Apparently  patient has not been taking part in any activities recently. She has been lying in her bed all day long. Denies any dizziness or lightheadedness currently.  Objective:  Vital Signs  Filed Vitals:   07/25/15 2345 07/26/15 0000 07/26/15 0220 07/26/15 0536  BP: 159/81 154/83 146/80 139/75  Pulse: 98 102 92 91  Temp:   98.5 F (36.9 C) 97.6 F (36.4 C)  TempSrc:   Oral Oral  Resp:    19  Height:      Weight:      SpO2: 96% 96% 97% 95%    Intake/Output Summary (Last 24 hours) at 07/26/15 1329 Last data filed at 07/26/15 1052  Gross per 24 hour  Intake    100 ml  Output    351 ml  Net   -251 ml   Filed Weights   07/25/15 1541  Weight: 64.071 kg (141 lb 4 oz)    General appearance: alert, cooperative, appears stated age, no distress and Flat affect Resp: clear to auscultation bilaterally Cardio: regular rate and rhythm, S1, S2 normal, no murmur, click, rub or gallop GI: soft, non-tender; bowel sounds normal; no masses,  no organomegaly Extremities: extremities normal, atraumatic, no cyanosis or edema Neurologic: Awake and alert. Oriented 3. Flat affect. Cranial nerves 2 to 12 intact. No pronator drift. Motor strength equal bilateral upper and lower extremities.  Lab Results:  Data Reviewed: I have personally reviewed following labs and imaging studies  CBC:  Recent Labs Lab 07/25/15 1546 07/25/15 1601 07/26/15 0413  WBC 6.2  --  6.3  NEUTROABS  3.7  --   --   HGB 11.3* 12.6 9.6*  HCT 33.9* 37.0 29.6*  MCV 88.7  --  88.1  PLT 304  --  [id number]   Basic Metabolic Panel:  Recent Labs Lab 07/25/15 1546 07/25/15 1601 07/26/15 0413  NA 135 140 139  K 3.8 3.9 3.8  CL 107 107 110  CO2 21*  --  19*  GLUCOSE 154* 150* 124*  BUN 10 11 11   CREATININE 1.23* 1.10* 1.10*  CALCIUM 9.3  --  8.8*   GFR: Estimated Creatinine Clearance: 40.6 mL/min (by C-G formula based on Cr of 1.1).  Liver Function Tests:  Recent Labs Lab 07/25/15 1546 07/26/15 0413  AST 19 18  ALT  12* 10*  ALKPHOS 68 58  BILITOT 0.4 0.5  PROT 7.0 6.0*  ALBUMIN 3.8 3.3*    Coagulation Profile:  Recent Labs Lab 07/25/15 1546  INR 1.05   CBG:  Recent Labs Lab 07/26/15 1112  GLUCAP 176*   Thyroid Function Tests:  Recent Labs  07/26/15 0421  TSH 1.355   Urine analysis:    Component Value Date/Time   COLORURINE YELLOW 07/25/2015 2131   APPEARANCEUR CLOUDY* 07/25/2015 2131   LABSPEC 1.020 07/25/2015 2131   PHURINE 5.5 07/25/2015 2131   [place] 07/25/2015 2131   HGBUR NEGATIVE 07/25/2015 2131   BILIRUBINUR NEGATIVE 07/25/2015 2131   KETONESUR NEGATIVE 07/25/2015 2131   PROTEINUR NEGATIVE 07/25/2015 2131   UROBILINOGEN 0.2 09/16/2014 0009   NITRITE NEGATIVE 07/25/2015 2131   LEUKOCYTESUR SMALL* 07/25/2015 2131     Radiology Studies: Ct Head Wo Contrast  07/25/2015  CLINICAL DATA:  70 year old female with generalized headache and nausea for the past 2-3 days EXAM: CT HEAD WITHOUT CONTRAST TECHNIQUE: Contiguous axial images were obtained from the base of the skull through the vertex without intravenous contrast. COMPARISON:  Prior brain MRI 07/12/2015; prior CT scan of the head 07/02/2015 FINDINGS: Subtle hypoattenuation extending to the cortical surface in the medial aspect of the left occipital lobe concerning for subacute ischemia in the left PCA territory. Otherwise, negative for acute intracranial hemorrhage, mass, mass lesion, mass effect or hydrocephalus. Focal low attenuation in the left corona radiata consistent with a remote prior lacunar infarct. Mild cerebral cortical atrophy. No focal soft tissue or calvarial abnormality. Globes and orbits are intact and symmetric. Chronic mucoperiosteal thickening in the right maxillary antrum. IMPRESSION: 1. No acute intracranial abnormality. 2. Subacute left PCA territory infarct as seen on prior MRI from 07/02/2015. 3. Stable remote lacunar infarct in the left corona radiata. 4. Chronic inflammatory paranasal  sinus disease in the right maxillary antrum. Electronically Signed   By: Jacqulynn Cadet M.D.   On: 07/25/2015 17:10     Medications:  Scheduled: . aspirin EC  81 mg Oral Daily  . atorvastatin  40 mg Oral QHS  . clopidogrel  75 mg Oral Daily  . docusate sodium  100 mg Oral BID  . enoxaparin (LOVENOX) injection  40 mg Subcutaneous Daily  . ferrous sulfate  325 mg Oral Q breakfast  . folic acid  1 mg Oral Daily  . gabapentin  100 mg Oral TID  . hydrocortisone cream  1 application Topical BID  . insulin aspart  0-9 Units Subcutaneous TID WC  . metoprolol tartrate  12.5 mg Oral BID  . midodrine  10 mg Oral TID WC  . sertraline  75 mg Oral Daily  . vitamin B-12  1,000 mcg Oral Daily   Continuous: . sodium  chloride 75 mL/hr at 07/26/15 0931   [id number] **OR** acetaminophen, albuterol, hydrOXYzine, meclizine, metoCLOPramide, ondansetron **OR** ondansetron (ZOFRAN) IV, ondansetron  Assessment/Plan:  Principal Problem:   Orthostatic hypotension Active Problems:   MDD (major depressive disorder), recurrent episode, moderate ([name])   HYPERTENSION, BENIGN SYSTEMIC   HLD (hyperlipidemia)   Autonomic orthostatic hypotension   Diastolic CHF ([name])   Anemia, iron deficiency   Anhedonia    Orthostatic hypotension with generalized weakness and fatigue Patient was hydrated. Repeat orthostatic vital signs. Continue midodrine. PT and OT evaluation.  Anhedonia with suspected major depressive episode Patient admits to feeling extremely low and depressed. However, she denies any suicidal ideation. She is noted to be on sertraline, which will be continued. Will consult psychiatric for assistance. Her recent acute medical problems are also likely contributing to this.  Iron deficiency anemia Significant drop in her hemoglobin this morning. Which is actually close to her baseline. Elevated hemoglobin level at admission was likely due to hemoconcentration. No overt bleeding identified.  Continue to monitor closely. Continue ferrous sulfate.  Chronic kidney disease, stage 3 Baseline creatinine is around 1.2-1.4. Appears to be close to it. Monitor urine output.  Recent stroke with gait abnormality Patient was recently discharged on May 9 after being managed for a left PCA stroke. PT and OT to evaluate. Continue aspirin and Plavix. Continue statin.  History of diastolic CHF, chronic Stable  History of diabetes mellitus type 2 Recent HbA1c was 4.9. Her oral agents were discontinued at that time. Continue to monitor closely. Continue SSI.   History of coronary artery disease Continue antiplatelet agents. Continue statin.   DVT Prophylaxis: Lovenox    Code Status: Full code  Family Communication: Discussed with the patient and her significant other  Disposition Plan: Await psychiatric input. PT and OT evaluation.     [facility]  [facility] Pager [phone] 07/26/2015, 1:29 PM  If 7PM-7AM, please contact night-coverage at [email or web address], password [facility]
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
## 2015-07-26 NOTE — ED Notes (Signed)
See downtime assessment

## 2015-07-26 NOTE — Care Management Obs Status (Signed)
[employer] NOTIFICATION   Patient Details  Name: Maria Fox MRN: [id number] Date of Birth: [birth year]   Medicare Observation Status Notification Given:  Yes    Dawayne Patricia, RN 07/26/2015, 10:44 AM
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
## 2015-07-27 ENCOUNTER — Other Ambulatory Visit: Payer: Medicare Other

## 2015-07-27 ENCOUNTER — Ambulatory Visit: Payer: Medicare Other

## 2015-07-27 DIAGNOSIS — I951 Orthostatic hypotension: Secondary | ICD-10-CM | POA: Diagnosis not present

## 2015-07-27 LAB — CBC
HEMATOCRIT: 30.6 % — AB (ref 36.0–46.0)
Hemoglobin: 9.9 g/dL — ABNORMAL LOW (ref 12.0–15.0)
MCH: 28.8 pg (ref 26.0–34.0)
MCHC: 32.4 g/dL (ref 30.0–36.0)
MCV: 89 fL (ref 78.0–100.0)
PLATELETS: 268 10*3/uL (ref 150–400)
RBC: 3.44 MIL/uL — ABNORMAL LOW (ref 3.87–5.11)
RDW: 13.7 % (ref 11.5–15.5)
WBC: 5.6 10*3/uL (ref 4.0–10.5)

## 2015-07-27 LAB — BASIC METABOLIC PANEL
Anion gap: 8 (ref 5–15)
BUN: 13 mg/dL (ref 6–20)
CALCIUM: 8.9 mg/dL (ref 8.9–10.3)
CO2: 24 mmol/L (ref 22–32)
CREATININE: 1.1 mg/dL — AB (ref 0.44–1.00)
Chloride: 108 mmol/L (ref 101–111)
GFR, EST AFRICAN AMERICAN: 57 mL/min — AB (ref >60)
GFR, EST NON AFRICAN AMERICAN: 49 mL/min — AB (ref >60)
GLUCOSE: 129 mg/dL — AB (ref 65–99)
Potassium: 4.2 mmol/L (ref 3.5–5.1)
Sodium: 140 mmol/L (ref 135–145)

## 2015-07-27 LAB — GLUCOSE, CAPILLARY
GLUCOSE-CAPILLARY: 150 mg/dL — AB (ref 65–99)
Glucose-Capillary: 97 mg/dL (ref 65–99)
Glucose-Capillary: 118 mg/dL — ABNORMAL HIGH (ref 65–99)

## 2015-07-27 LAB — URINE CULTURE

## 2015-07-27 LAB — MAGNESIUM: Magnesium: 1.9 mg/dL (ref 1.7–2.4)

## 2015-07-27 MED ORDER — DULOXETINE HCL 30 MG PO CPEP: 30.0000 mg | ORAL_CAPSULE | Freq: Every day | ORAL | Status: DC

## 2015-07-27 MED ORDER — CYANOCOBALAMIN 1000 MCG PO TABS: 1000.0000 ug | ORAL_TABLET | Freq: Every day | ORAL | Status: DC

## 2015-07-27 NOTE — Discharge Summary (Signed)
[facility]  Physician Discharge Summary   Patient ID: Maria Fox MRN: [id number] DOB/AGE: 1946/01/05 70 y.o.  Admit date: 07/25/2015 Discharge date: 07/27/2015  PCP: [name], MD  DISCHARGE DIAGNOSES:  Principal Problem:   Orthostatic hypotension Active Problems:   MDD (major depressive disorder), recurrent episode, moderate ([name])   HYPERTENSION, BENIGN SYSTEMIC   HLD (hyperlipidemia)   Autonomic orthostatic hypotension   Diastolic CHF ([name])   Anemia, iron deficiency   Anhedonia   RECOMMENDATIONS FOR OUTPATIENT FOLLOW UP: 1. Patient will benefit from outpatient follow-up with psychiatry 2. Please consider Florinef if patient's orthostatic hypotension does not improve with current management   DISCHARGE CONDITION: fair  Diet recommendation: As before  Filed Weights   07/25/15 1541  Weight: 64.071 kg (141 lb 4 oz)    INITIAL HISTORY: 70 year old Caucasian female with a past medical history of hypertension, vitiligo, diabetes, coronary artery disease, stroke, presented from home with complaints of fatigue and weakness. Patient was noted to be orthostatic in the emergency department. Also, history of decreased appetite, increased somnolence, decreased interest in activities in which she was previously taking part.  Consultations:  Psychiatry  Procedures:  None  HOSPITAL COURSE:   Orthostatic hypotension with generalized weakness and fatigue Patient was hydrated. Patient's orthostatic vital signs have improved. However, she still feels slightly dizzy when she gets up from a sitting or lying position. Midodrine is at the maximum dose. Recommend using TED stockings. And I have instructed her to get up slowly from a sitting or lying position. If despite this, patient continues to have significant symptoms, the next option would be to use Florinef. She can discuss this further with her PCP.   Major depressive episode Patient admits to feeling extremely  low and depressed. However, she denies any suicidal ideation. She was noted to be on sertraline. Patient seen by psychiatry. They recommended changing sertraline to Cymbalta. Patient does not need inpatient psychiatric care.  Iron deficiency anemia She does have a history of iron deficiency. When she was admitted, her hemoglobin was elevated due to hemoconcentration. Subsequently has been stable. She may continue taking her ferrous sulfate.   Chronic kidney disease, stage 3 Renal function is close to baseline..  Recent stroke with gait abnormality Patient was recently discharged on May 9 after being managed for a left PCA stroke. Seen by PT and OT. Recommend home health. Continue aspirin, Plavix and statin.   History of diastolic CHF, chronic Stable  History of diabetes mellitus type 2 Recent HbA1c was 4.9. It was recommended to her to stop taking her oral agents during her previous hospitalization. We will continue with this recommendation. She should follow closely with her PCP.   History of coronary artery disease Continue antiplatelet agents. Continue statin.  Overall improved. Patient is medically stable for discharge. Home health was recommended by PT, however, patient declines. Outpatient PT has been ordered.   PERTINENT LABS:  The results of significant diagnostics from this hospitalization (including imaging, microbiology, ancillary and laboratory) are listed below for reference.    Microbiology: Recent Results (from the past 240 hour(s))  Culture, Urine     Status: Abnormal   Collection Time: 07/25/15  9:31 PM  Result Value Ref Range Status   Specimen Description URINE, RANDOM  Final   Special Requests NONE  Final   Culture MULTIPLE SPECIES PRESENT, SUGGEST RECOLLECTION (A)  Final   Report Status 07/27/2015 FINAL  Final     Labs: Basic Metabolic Panel:  Recent Labs Lab 07/25/15  1546 07/25/15 1601 07/26/15 0413 07/27/15 0327 07/27/15 1006  NA 135 140 139 140   --   K 3.8 3.9 3.8 4.2  --   CL 107 107 110 108  --   CO2 21*  --  19* 24  --   GLUCOSE 154* 150* 124* 129*  --   BUN 10 11 11 13   --   CREATININE 1.23* 1.10* 1.10* 1.10*  --   CALCIUM 9.3  --  8.8* 8.9  --   MG  --   --   --   --  1.9   Liver Function Tests:  Recent Labs Lab 07/25/15 1546 07/26/15 0413  AST 19 18  ALT 12* 10*  ALKPHOS 68 58  BILITOT 0.4 0.5  PROT 7.0 6.0*  ALBUMIN 3.8 3.3*   CBC:  Recent Labs Lab 07/25/15 1546 07/25/15 1601 07/26/15 0413 07/26/15 1229 07/27/15 0327  WBC 6.2  --  6.3  --  5.6  NEUTROABS 3.7  --   --   --   --   HGB 11.3* 12.6 9.6* 9.8* 9.9*  HCT 33.9* 37.0 29.6* 29.4* 30.6*  MCV 88.7  --  88.1  --  89.0  PLT 304  --  256  --  268   Cardiac Enzymes:  Recent Labs Lab 07/26/15 1229  TROPONINI 0.03   BNP: BNP (last 3 results)  Recent Labs  08/07/14 1344 09/09/14 1315  BNP 297.8* 118.6*     CBG:  Recent Labs Lab 07/26/15 1112 07/26/15 1658 07/26/15 2212 07/27/15 0635 07/27/15 1115  GLUCAP 176* 114* 97 118* 150*     IMAGING STUDIES Dg Chest 2 View  07/09/2015  CLINICAL DATA:  Hypoxia. Dizziness. Nausea and vomiting. Cough. Symptoms for 4 days. EXAM: CHEST  2 VIEW COMPARISON:  09/09/2014 FINDINGS: Normal heart size. Subsegmental atelectasis of the left base. Postoperative changes. External objects project over the upper lung zones. IMPRESSION: Subsegmental atelectasis at the left base. Electronically Signed   By: Marybelle Killings M.D.   On: 07/09/2015 08:59   Ct Head Wo Contrast  07/25/2015  CLINICAL DATA:  70 year old female with generalized headache and nausea for the past 2-3 days EXAM: CT HEAD WITHOUT CONTRAST TECHNIQUE: Contiguous axial images were obtained from the base of the skull through the vertex without intravenous contrast. COMPARISON:  Prior brain MRI 07/12/2015; prior CT scan of the head 07/02/2015 FINDINGS: Subtle hypoattenuation extending to the cortical surface in the medial aspect of the left  occipital lobe concerning for subacute ischemia in the left PCA territory. Otherwise, negative for acute intracranial hemorrhage, mass, mass lesion, mass effect or hydrocephalus. Focal low attenuation in the left corona radiata consistent with a remote prior lacunar infarct. Mild cerebral cortical atrophy. No focal soft tissue or calvarial abnormality. Globes and orbits are intact and symmetric. Chronic mucoperiosteal thickening in the right maxillary antrum. IMPRESSION: 1. No acute intracranial abnormality. 2. Subacute left PCA territory infarct as seen on prior MRI from 07/02/2015. 3. Stable remote lacunar infarct in the left corona radiata. 4. Chronic inflammatory paranasal sinus disease in the right maxillary antrum. Electronically Signed   By: Jacqulynn Cadet M.D.   On: 07/25/2015 17:10   Ct Head Wo Contrast  07/02/2015  CLINICAL DATA:  Trip and fall injury. No loss of consciousness. Bruising and bleeding over the nose. EXAM: CT HEAD WITHOUT CONTRAST CT MAXILLOFACIAL WITHOUT CONTRAST CT CERVICAL SPINE WITHOUT CONTRAST TECHNIQUE: Multidetector CT imaging of the head, cervical spine, and maxillofacial structures were performed using  the standard protocol without intravenous contrast. Multiplanar CT image reconstructions of the cervical spine and maxillofacial structures were also generated. COMPARISON:  CT head 08/07/2014 FINDINGS: CT HEAD FINDINGS Diffuse cerebral atrophy. Ventricular dilatation consistent with central atrophy. Patchy low-attenuation changes in the deep white matter consistent with small vessel ischemia. Old white matter infarct in the left deep white matter. No mass effect or midline shift. No abnormal extra-axial fluid collections. Gray-white matter junctions are distinct. Basal cisterns are not effaced. No evidence of acute intracranial hemorrhage. No depressed skull fractures. Partial opacification of paranasal sinuses. Mastoid air cells are not opacified. Vascular calcifications. CT  MAXILLOFACIAL FINDINGS The globes and extraocular muscles appear intact and symmetrical. Mucosal thickening in the ethmoid air cells. Prominent mucosal thickening in the right maxillary antrum. Air-fluid levels in the sphenoid sinuses. There appear to be acute nondisplaced fractures of the medial and lateral wall of the right maxillary antrum with mildly depressed anterior nasal bone fractures. The orbital rims appear intact. Left maxillary antral walls appear intact. Mandibles, temporomandibular joints, zygomatic arches, and pterygoid plates appear intact. Soft tissues are unremarkable. CT CERVICAL SPINE FINDINGS Straightening of the usual cervical lordosis. This may be due to patient positioning but ligamentous injury or muscle spasm can also have this appearance and are not excluded. No anterior subluxation. Normal alignment of the facet joints. No vertebral compression deformities. No prevertebral soft tissue swelling. Intervertebral disc space heights are preserved. C1-2 articulation appears intact. No focal bone lesion or bone destruction. Bony fluid and material demonstrated in the nasopharynx and oropharynx. This is likely inflammatory. IMPRESSION: No acute intracranial abnormalities. Chronic atrophy and small vessel ischemic changes. Nondisplaced fractures of the right maxillary antral walls and of the anterior nasal bones. Probable inflammatory mucosal thickening and fluid in the paranasal sinuses. Nonspecific straightening of usual cervical lordosis. No acute displaced fractures identified. Probable inflammatory secretions demonstrated in the nasopharynx. Electronically Signed   By: Lucienne Capers M.D.   On: 07/02/2015 05:26   Ct Cervical Spine Wo Contrast  07/02/2015  CLINICAL DATA:  Trip and fall injury. No loss of consciousness. Bruising and bleeding over the nose. EXAM: CT HEAD WITHOUT CONTRAST CT MAXILLOFACIAL WITHOUT CONTRAST CT CERVICAL SPINE WITHOUT CONTRAST TECHNIQUE: Multidetector CT  imaging of the head, cervical spine, and maxillofacial structures were performed using the standard protocol without intravenous contrast. Multiplanar CT image reconstructions of the cervical spine and maxillofacial structures were also generated. COMPARISON:  CT head 08/07/2014 FINDINGS: CT HEAD FINDINGS Diffuse cerebral atrophy. Ventricular dilatation consistent with central atrophy. Patchy low-attenuation changes in the deep white matter consistent with small vessel ischemia. Old white matter infarct in the left deep white matter. No mass effect or midline shift. No abnormal extra-axial fluid collections. Gray-white matter junctions are distinct. Basal cisterns are not effaced. No evidence of acute intracranial hemorrhage. No depressed skull fractures. Partial opacification of paranasal sinuses. Mastoid air cells are not opacified. Vascular calcifications. CT MAXILLOFACIAL FINDINGS The globes and extraocular muscles appear intact and symmetrical. Mucosal thickening in the ethmoid air cells. Prominent mucosal thickening in the right maxillary antrum. Air-fluid levels in the sphenoid sinuses. There appear to be acute nondisplaced fractures of the medial and lateral wall of the right maxillary antrum with mildly depressed anterior nasal bone fractures. The orbital rims appear intact. Left maxillary antral walls appear intact. Mandibles, temporomandibular joints, zygomatic arches, and pterygoid plates appear intact. Soft tissues are unremarkable. CT CERVICAL SPINE FINDINGS Straightening of the usual cervical lordosis. This may be due to patient positioning  but ligamentous injury or muscle spasm can also have this appearance and are not excluded. No anterior subluxation. Normal alignment of the facet joints. No vertebral compression deformities. No prevertebral soft tissue swelling. Intervertebral disc space heights are preserved. C1-2 articulation appears intact. No focal bone lesion or bone destruction. Bony fluid  and material demonstrated in the nasopharynx and oropharynx. This is likely inflammatory. IMPRESSION: No acute intracranial abnormalities. Chronic atrophy and small vessel ischemic changes. Nondisplaced fractures of the right maxillary antral walls and of the anterior nasal bones. Probable inflammatory mucosal thickening and fluid in the paranasal sinuses. Nonspecific straightening of usual cervical lordosis. No acute displaced fractures identified. Probable inflammatory secretions demonstrated in the nasopharynx. Electronically Signed   By: Lucienne Capers M.D.   On: 07/02/2015 05:26   Mr Brain Wo Contrast  07/12/2015  CLINICAL DATA:  Dizziness.  Diffuse weakness. EXAM: MRI HEAD WITHOUT CONTRAST TECHNIQUE: Multiplanar, multiecho pulse sequences of the brain and surrounding structures were obtained without intravenous contrast. COMPARISON:  CT head without contrast 07/02/2015. FINDINGS: Extensive areas are restricted diffusion involves the medial left occipital lobe and parietal lobe as well as the left side of the splenium of the corpus callosum. There scattered areas of restricted diffusion within the left thalamus. Findings are compatible with acute/ subacute nonhemorrhagic left PCA distribution infarct. T2 and FLAIR signal changes suggest a subacute time frame. No acute hemorrhage is associated. There is no mass lesion. Remote lacunar infarcts are present in the left basal ganglia and coronal radiata. Moderate periventricular T2 changes are advanced for age. There is moderate generalized atrophy. The ventricles are proportionate to the degree of atrophy. Mild white matter changes extend into the brainstem. The cerebellum is unremarkable. The internal auditory canals are within normal limits bilaterally. Flow is present in the major intracranial arteries. Bilateral lens replacements are present. The globes and orbits are otherwise unremarkable. Mild mucosal thickening is evident in the right maxillary sinus.  The remaining paranasal sinuses and the mastoid air cells are clear. The skullbase is within normal limits. Midline sagittal images are unremarkable. IMPRESSION: 1. Acute/subacute left PCA territory infarct 2. Moderate atrophy and white matter disease likely reflects the sequela of chronic microvascular ischemia. 3. Remote lacunar infarcts involving the left lentiform nucleus and corona radiata. 4. Minimal right maxillary sinus disease. These results were called by telephone at the time of interpretation on 07/12/2015 at 8:22 pm to Dr. Janee Morn , who verbally acknowledged these results. Electronically Signed   By: San Morelle M.D.   On: 07/12/2015 20:41   Ct Maxillofacial Wo Cm  07/02/2015  CLINICAL DATA:  Trip and fall injury. No loss of consciousness. Bruising and bleeding over the nose. EXAM: CT HEAD WITHOUT CONTRAST CT MAXILLOFACIAL WITHOUT CONTRAST CT CERVICAL SPINE WITHOUT CONTRAST TECHNIQUE: Multidetector CT imaging of the head, cervical spine, and maxillofacial structures were performed using the standard protocol without intravenous contrast. Multiplanar CT image reconstructions of the cervical spine and maxillofacial structures were also generated. COMPARISON:  CT head 08/07/2014 FINDINGS: CT HEAD FINDINGS Diffuse cerebral atrophy. Ventricular dilatation consistent with central atrophy. Patchy low-attenuation changes in the deep white matter consistent with small vessel ischemia. Old white matter infarct in the left deep white matter. No mass effect or midline shift. No abnormal extra-axial fluid collections. Gray-white matter junctions are distinct. Basal cisterns are not effaced. No evidence of acute intracranial hemorrhage. No depressed skull fractures. Partial opacification of paranasal sinuses. Mastoid air cells are not opacified. Vascular calcifications. CT MAXILLOFACIAL FINDINGS The globes and  extraocular muscles appear intact and symmetrical. Mucosal thickening in the ethmoid air  cells. Prominent mucosal thickening in the right maxillary antrum. Air-fluid levels in the sphenoid sinuses. There appear to be acute nondisplaced fractures of the medial and lateral wall of the right maxillary antrum with mildly depressed anterior nasal bone fractures. The orbital rims appear intact. Left maxillary antral walls appear intact. Mandibles, temporomandibular joints, zygomatic arches, and pterygoid plates appear intact. Soft tissues are unremarkable. CT CERVICAL SPINE FINDINGS Straightening of the usual cervical lordosis. This may be due to patient positioning but ligamentous injury or muscle spasm can also have this appearance and are not excluded. No anterior subluxation. Normal alignment of the facet joints. No vertebral compression deformities. No prevertebral soft tissue swelling. Intervertebral disc space heights are preserved. C1-2 articulation appears intact. No focal bone lesion or bone destruction. Bony fluid and material demonstrated in the nasopharynx and oropharynx. This is likely inflammatory. IMPRESSION: No acute intracranial abnormalities. Chronic atrophy and small vessel ischemic changes. Nondisplaced fractures of the right maxillary antral walls and of the anterior nasal bones. Probable inflammatory mucosal thickening and fluid in the paranasal sinuses. Nonspecific straightening of usual cervical lordosis. No acute displaced fractures identified. Probable inflammatory secretions demonstrated in the nasopharynx. Electronically Signed   By: Lucienne Capers M.D.   On: 07/02/2015 05:26    DISCHARGE EXAMINATION: Filed Vitals:   07/26/15 0220 07/26/15 0536 07/26/15 1348 07/26/15 2216  BP: 146/80 139/75 143/57 164/71  Pulse: 92 91 71 71  Temp: 98.5 F (36.9 C) 97.6 F (36.4 C) 97.8 F (36.6 C) 97.8 F (36.6 C)  TempSrc: Oral Oral Oral Oral  Resp:  19 18 18   Height:      Weight:      SpO2: 97% 95% 93%    General appearance: alert, cooperative, appears stated age and no  distress Resp: clear to auscultation bilaterally Cardio: regular rate and rhythm, S1, S2 normal, no murmur, click, rub or gallop GI: soft, non-tender; bowel sounds normal; no masses,  no organomegaly Extremities: extremities normal, atraumatic, no cyanosis or edema  DISPOSITION: Home  Discharge Instructions    Call MD for:  difficulty breathing, headache or visual disturbances    Complete by:  As directed      Call MD for:  extreme fatigue    Complete by:  As directed      Call MD for:  persistant dizziness or light-headedness    Complete by:  As directed      Call MD for:  persistant nausea and vomiting    Complete by:  As directed      Call MD for:  severe uncontrolled pain    Complete by:  As directed      Call MD for:  temperature >100.4    Complete by:  As directed      Diet - low sodium heart healthy    Complete by:  As directed      Discharge instructions    Complete by:  As directed   Talk to your PCP before resuming your diabetes medications. If you continue to get dizziness with standing despite taking the medications and following other instructions, you may need to be placed on a medicine called Florinef. Please talk to your PCP about this. Your B12 level was low and so this has been prescribed to you.  You were cared for by a hospitalist during your hospital stay. If you have any questions about your discharge medications or the care you received  while you were in the hospital after you are discharged, you can call the unit and asked to speak with the hospitalist on call if the hospitalist that took care of you is not available. Once you are discharged, your primary care physician will handle any further medical issues. Please note that NO REFILLS for any discharge medications will be authorized once you are discharged, as it is imperative that you return to your primary care physician (or establish a relationship with a primary care physician if you do not have one) for your  aftercare needs so that they can reassess your need for medications and monitor your lab values. If you do not have a primary care physician, you can call [phone] for a physician referral.     Increase activity slowly    Complete by:  As directed            ALLERGIES:  Allergies  Allergen Reactions  . Mushroom Extract Complex Anaphylaxis  . Codeine Nausea And Vomiting  . Penicillins Hives and Swelling    Has patient had a PCN reaction causing immediate rash, facial/tongue/throat swelling, SOB or lightheadedness with hypotension: Yes Has patient had a PCN reaction causing severe rash involving mucus membranes or skin necrosis: Yes Has patient had a PCN reaction that required hospitalization Yes Has patient had a PCN reaction occurring within the last 10 years: No If all of the above answers are "NO", then may proceed with Cephalosporin use.   . Sulfa Antibiotics Nausea And Vomiting  . Erythromycin Base Rash    Zofran discontinued due to prolonged QT interval. Magnesium level was normal as was potassium.  Discharge Medication List as of 07/27/2015 11:53 AM    START taking these medications   Details  DULoxetine (CYMBALTA) 30 MG capsule Take 1 capsule (30 mg total) by mouth daily., Starting 07/27/2015, Until Discontinued, Normal    vitamin B-12 1000 MCG tablet Take 1 tablet (1,000 mcg total) by mouth daily., Starting 07/27/2015, Until Discontinued, Normal      CONTINUE these medications which have NOT CHANGED   Details  aspirin EC 81 MG tablet Take 81 mg by mouth daily. , Until Discontinued, Historical Med    atorvastatin (LIPITOR) 40 MG tablet Take 1 tablet (40 mg total) by mouth at bedtime., Starting 07/17/2015, Until Discontinued, Normal    clopidogrel (PLAVIX) 75 MG tablet Take 1 tablet (75 mg total) by mouth daily., Starting 09/23/2014, Until Discontinued, Normal    docusate sodium (COLACE) 100 MG capsule Take 1 capsule (100 mg total) by mouth every 12 (twelve) hours.,  Starting 09/09/2014, Until Discontinued, Print    ferrous sulfate 325 (65 FE) MG tablet Take 1 tablet (325 mg total) by mouth daily., Starting 09/27/2014, Until Discontinued, Normal    folic acid (FOLVITE) 1 MG tablet Take 1 tablet (1 mg total) by mouth daily., Starting 09/27/2014, Until Discontinued, Normal    gabapentin (NEURONTIN) 100 MG capsule TAKE ONE CAPSULE BY MOUTH THREE TIMES DAILY, Normal    hydrocortisone cream 1 % Apply 1 application topically 2 (two) times daily., Starting 03/28/2014, Until Discontinued, Print    hydrOXYzine (ATARAX/VISTARIL) 10 MG tablet Take 1 tablet (10 mg total) by mouth 3 (three) times daily as needed for itching., Starting 11/07/2014, Until Discontinued, Normal    meclizine (ANTIVERT) 25 MG tablet TAKE ONE TABLET BY MOUTH THREE TIMES DAILY AS NEEDED FOR DIZZINESS, Normal    metoCLOPramide (REGLAN) 10 MG tablet Take 1 tablet (10 mg total) by mouth every 6 (six) hours  as needed for nausea or vomiting., Starting 07/17/2015, Until Discontinued, Normal    Vitamin D, Ergocalciferol, (DRISDOL) 50000 units CAPS capsule TAKE ONE CAPSULE BY MOUTH EVERY 7 DAYS., Normal    famotidine (PEPCID) 20 MG tablet Take 1 tablet (20 mg total) by mouth at bedtime., Starting 10/06/2014, Until Discontinued, Normal    feeding supplement, GLUCERNA SHAKE, (GLUCERNA SHAKE) LIQD Take 237 mLs by mouth 3 (three) times daily between meals., Starting 07/18/2014, Until Discontinued, Print    metoprolol tartrate (LOPRESSOR) 25 MG tablet Take 0.5 tablets (12.5 mg total) by mouth 2 (two) times daily., Starting 08/22/2014, Until Discontinued, No Print    midodrine (PROAMATINE) 10 MG tablet Take 1 tablet (10 mg total) by mouth 3 (three) times daily with meals., Starting 07/21/2014, Until Discontinued, Normal    traMADol (ULTRAM) 50 MG tablet TAKE ONE TABLET BY MOUTH EVERY 6 HOURS AS NEEDED, Phone In      STOP taking these medications     linagliptin (TRADJENTA) 5 MG TABS tablet      ondansetron  (ZOFRAN ODT) 4 MG disintegrating tablet      sertraline (ZOLOFT) 50 MG tablet        Follow-up Information    Follow up with [name], MD. Schedule an appointment as soon as possible for a visit in 1 week.   Specialty:  Family Medicine   Why:  post hospitalization follow up and further management of orthostatic hypotension   Contact information:   [place] [address] [phone]       Follow up with [facility]   Specialty:  Rehabilitation   Why:  referral has been sent for outpt PT--    Contact information:   [address] [id number] [place]      TOTAL DISCHARGE TIME: 35 minutes  [facility] Pager [phone]  07/27/2015, 3:42 PM

## 2015-07-27 NOTE — Clinical Social Work Note (Signed)
CSW provided patient's RN with list of outpatient resources: Medication/ Therapy for patient. CSW signing off.  [employer], LCSW [phone]
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
## 2015-07-27 NOTE — Discharge Instructions (Signed)
Orthostatic Hypotension °Orthostatic hypotension is a sudden drop in blood pressure. It happens when you quickly stand up from a seated or lying position. You may feel dizzy or light-headed. This can last for just a few seconds or for up to a few minutes. It is usually not a serious problem. However, if this happens frequently or gets worse, it can be a sign of something more serious. °CAUSES  °Different things can cause orthostatic hypotension, including:  °· Loss of body fluids (dehydration). °· Medicines that lower blood pressure. °· Sudden changes in posture, such as standing up quickly after you have been sitting or lying down. °· Taking too much of your medicine. °SIGNS AND SYMPTOMS  °· Light-headedness or dizziness.   °· Fainting or near-fainting.   °· A fast heart rate.   °· Weakness.   °· Feeling tired (fatigue).   °DIAGNOSIS  °Your health care provider may do several things to help diagnose your condition and identify the cause. These may include:  °· Taking a medical history and doing a physical exam. °· Checking your blood pressure. Your health care provider will check your blood pressure when you are: °¨ Lying down. °¨ Sitting. °¨ Standing. °· Using tilt table testing. In this test, you lie down on a table that moves from a lying position to a standing position. You will be strapped onto the table. This test monitors your blood pressure and heart rate when you are in different positions. °TREATMENT  °Treatment will vary depending on the cause. Possible treatments include:  °· Changing the dosage of your medicines.  °· Wearing compression stockings on your lower legs. °· Standing up slowly after sitting or lying down. °· Eating more salt. °· Eating frequent, small meals. °· In some cases, getting IV fluids. °· Taking medicine to enhance fluid retention. °HOME CARE INSTRUCTIONS °· Only take over-the-counter or prescription medicines as directed by your health care provider. °¨ Follow your health care  provider's instructions for changing the dosage of your current medicines. °¨ Do not stop or adjust your medicine on your own. °· Stand up slowly after sitting or lying down. This allows your body to adjust to the different position. °· Wear compression stockings as directed. °· Eat extra salt as directed. °¨ Do not add extra salt to your diet unless directed to by your health care provider. °· Eat frequent, small meals. °· Avoid standing suddenly after eating. °· Avoid hot showers or excessive heat as directed by your health care provider. °· Keep all follow-up appointments. °SEEK MEDICAL CARE IF: °· You continue to feel dizzy or light-headed after standing. °· You feel groggy or confused. °· You feel cold, clammy, or sick to your stomach (nauseous). °· You have blurred vision. °· You feel short of breath. °SEEK IMMEDIATE MEDICAL CARE IF:  °· You faint after standing. °· You have chest pain.  °· You have difficulty breathing.   °· You lose feeling or movement in your arms or legs.   °· You have slurred speech or difficulty talking, or you are unable to talk.   °MAKE SURE YOU:  °· Understand these instructions. °· Will watch your condition. °· Will get help right away if you are not doing well or get worse. °  °This information is not intended to replace advice given to you by your health care provider. Make sure you discuss any questions you have with your health care provider. °  °Document Released: 02/15/2002 Document Revised: 03/02/2013 Document Reviewed: 12/18/2012 °Elsevier Interactive Patient Education ©2016 Elsevier Inc. ° °

## 2015-07-27 NOTE — Care Management Note (Signed)
Case Management Note Marvetta Gibbons RN, BSN Unit 2W-Case Manager [phone]  Patient Details  Name: Maria Fox MRN: [id number] Date of Birth: 04-28-1945  Subjective/Objective:    Pt admitted due to weakness and orthostatic hypotention                Action/Plan: PTA pt lived at home with SO - recommendations for [facility] - orders have been placed for HHRN/PT- in to speak with pt and offer choice- per conversation pt refuses [facility] services- states that she does not want anyone coming into home due to renovations that they are doing. Pt states that she would do outpt PT -choice offered for location of outpt services- pt states [facility] location would be best for her- spoke with MD- who gave verbal order to make referral to outpt PT for services- referral sent to [facility] on [address] For outpt PT. No further CM needs noted.    Expected Discharge Date:    07/27/15              Expected Discharge Plan:  [place]  In-House Referral:     Discharge planning Services  CM Consult  Post Acute Care Choice:  Home Health Choice offered to:  Patient  DME Arranged:    DME Agency:     HH Arranged:  RN, PT, Patient Refused [place] Agency:     Status of Service:  Completed, signed off  Medicare Important Message Given:    Date Medicare IM Given:    Medicare IM give by:    Date Additional Medicare IM Given:    Additional Medicare Important Message give by:     If discussed at [place] of Stay Meetings, dates discussed:    Additional Comments:  Dawayne Patricia, RN 07/27/2015, 11:47 AM

## 2015-07-27 NOTE — Progress Notes (Signed)
Order received to discharge.  Telemetry removed and CCMD notified.  IV removed with catheter intact.  Discharge education given to Pt and husband.  Outpatient resource sheet given to Pt to schedule appt for follow up with psychiatry to manage change in antidepressant medication.  Pt stable at discharge, no c/o chest pain or sob.

## 2015-07-27 NOTE — Progress Notes (Signed)
Pt discharged.  No c/o chest pain, sob or dizziness.  Pt stable at discharge.

## 2015-07-28 ENCOUNTER — Telehealth: Payer: Self-pay

## 2015-07-28 NOTE — Telephone Encounter (Signed)
Attempted to reach mbr to complete TCM call. Left voicemail on residential phone.

## 2015-07-31 ENCOUNTER — Other Ambulatory Visit: Payer: Self-pay

## 2015-07-31 NOTE — Patient Outreach (Signed)
[facility]) Care Management  07/31/2015  HILLARY CALIXTRO 1946-03-04 [id number]  69 year old admitted 4/30-5/9. RNCM attempted calls x3 unsuccessfully. Outreach letter sent on 5/12. Patient readmitted 5/16-5/18. RNCM called for transition of care. No answer. HIPPA compliant message left at both contact numbers listed.  Plan: continue to attempt to reach member.  [name], RN, MSN, [place] Coordinator Cell: [phone]

## 2015-08-01 ENCOUNTER — Other Ambulatory Visit: Payer: Self-pay

## 2015-08-01 NOTE — Patient Outreach (Signed)
[facility]) Care Management  08/01/2015  Maria Fox May 23, 1945 [id number]  70 year old readmitted 5/16 with orthostatic hypotension. RNCM called for transition of care call. Unable to leave message on [phone] line, HIPPA compliant message left on [phone].  Plan: await return call and continue to try to reach.  [name], RN, MSN, [place] Coordinator Cell: [phone]

## 2015-08-02 ENCOUNTER — Other Ambulatory Visit: Payer: Self-pay

## 2015-08-02 ENCOUNTER — Other Ambulatory Visit: Payer: Medicare Other

## 2015-08-02 NOTE — Patient Outreach (Signed)
[facility]) Care Management  08/02/2015  Maria Fox October 19, 1945 [id number]  70 year old with recent readmission. RNCM has attempted to contact member numerous times for transition of care on both listed number. With no answer. HIPPA compliant message left on [phone] line. The other number RNCM was not able to leave a voice message. Outreach letter sent on 5/12 with no response thus far. This is the 3rd telephone outreach since last admission.  Plan-Second outreach letter sent.   [name], RN, MSN, [place] Coordinator Cell: [phone]

## 2015-08-03 ENCOUNTER — Encounter: Payer: Self-pay | Admitting: Family Medicine

## 2015-08-03 ENCOUNTER — Ambulatory Visit (INDEPENDENT_AMBULATORY_CARE_PROVIDER_SITE_OTHER): Payer: Medicare Other | Admitting: Family Medicine

## 2015-08-03 ENCOUNTER — Ambulatory Visit: Payer: Medicare Other | Admitting: Cardiology

## 2015-08-03 ENCOUNTER — Telehealth: Payer: Self-pay | Admitting: Cardiology

## 2015-08-03 VITALS — BP 124/82 | HR 88 | Temp 97.5°F | Wt 140.5 lb

## 2015-08-03 DIAGNOSIS — I6389 Other cerebral infarction: Secondary | ICD-10-CM

## 2015-08-03 DIAGNOSIS — I638 Other cerebral infarction: Secondary | ICD-10-CM | POA: Diagnosis not present

## 2015-08-03 DIAGNOSIS — R110 Nausea: Secondary | ICD-10-CM

## 2015-08-03 DIAGNOSIS — E1165 Type 2 diabetes mellitus with hyperglycemia: Secondary | ICD-10-CM

## 2015-08-03 DIAGNOSIS — I63532 Cerebral infarction due to unspecified occlusion or stenosis of left posterior cerebral artery: Secondary | ICD-10-CM | POA: Diagnosis not present

## 2015-08-03 DIAGNOSIS — R531 Weakness: Secondary | ICD-10-CM

## 2015-08-03 DIAGNOSIS — D509 Iron deficiency anemia, unspecified: Secondary | ICD-10-CM

## 2015-08-03 DIAGNOSIS — F331 Major depressive disorder, recurrent, moderate: Secondary | ICD-10-CM

## 2015-08-03 DIAGNOSIS — IMO0002 Reserved for concepts with insufficient information to code with codable children: Secondary | ICD-10-CM

## 2015-08-03 DIAGNOSIS — S02401D Maxillary fracture, unspecified, subsequent encounter for fracture with routine healing: Secondary | ICD-10-CM

## 2015-08-03 DIAGNOSIS — E1140 Type 2 diabetes mellitus with diabetic neuropathy, unspecified: Secondary | ICD-10-CM | POA: Diagnosis not present

## 2015-08-03 DIAGNOSIS — I951 Orthostatic hypotension: Secondary | ICD-10-CM | POA: Diagnosis not present

## 2015-08-03 DIAGNOSIS — E46 Unspecified protein-calorie malnutrition: Secondary | ICD-10-CM

## 2015-08-03 MED ORDER — MIDODRINE HCL 10 MG PO TABS: 10.0000 mg | ORAL_TABLET | Freq: Three times a day (TID) | ORAL | Status: DC

## 2015-08-03 NOTE — Progress Notes (Signed)
Pre visit review using our clinic review tool, if applicable. No additional management support is needed unless otherwise documented below in the visit note. 

## 2015-08-03 NOTE — Telephone Encounter (Signed)
LMOVM requesting that pt send manual transmission b/c home monitor has not updated in at least 14 days.    

## 2015-08-03 NOTE — Patient Instructions (Addendum)
Clear liquids for next 1-2 days to see if that will settle stomach.  Try metoclopramide (reglan) - before your largest meal - to help with nausea.  Take medicines regularly and return in 2 weeks for recheck. Stop metoprolol for now. We will refer you to outpatient physical therapy for strengthening program.

## 2015-08-03 NOTE — Progress Notes (Signed)
BP 124/82 mmHg  Pulse 88  Temp(Src) 97.5 F (36.4 C) (Oral)  Wt 140 lb 8 oz (63.73 kg)   CC: hospital f/u visit  Subjective:    Patient ID: Maria Fox, female    DOB: [birth year], 70 y.o.   MRN: [id number]  HPI: GAGANDEEP DEKLE is a 70 y.o. female presenting on 08/03/2015 for Follow-up   Not seen since 09/2014. Multiple hospitalizations since then including for acute L PCA stroke early 07/2015 discharged to home, latest for orthostatic hypotension and depression 07/25/2015. Discharged on midodrine with ongoing orthostatic dizziness (although BP stable) --> see below. Suggested consider addition of florinef. MDD - changed by psych from sertraline to cymbalta. Declined HHPT, unsure if has been set up with outpatient PT. Loop recorder was placed during last hospitalization for further evaluation of cryptogenic embolic stroke.  H/o 3v CABG 06/2014.  She also had fall with nasal fracture 06/2015.  Brings bottles - no metoprolol or midodrine in her bottles - has not filled metoprolol, last midodrine refill was 3 months for 08/2014.   Continues nauseated - with occasional vomiting. rec continued aspirin, plavix and statin. Antidiabetic meds were stopped due to A1c 4.9%.   Continues to have trouble eating, staying nauseated. Just eating a few bites of burgers. Normal BM (no diarrhea/constipation/blood in stool)  Multiple attempts to contact patient by my office and by [facility] including to schedule transitional care visit - never able to reach patient.   F/u phone call: 07/28/2015 -> again unable to reach patient. Admit date: 07/25/2015 Discharge date: 07/27/2015 DISCHARGE DIAGNOSES:  Principal Problem:  Orthostatic hypotension Active Problems:  MDD (major depressive disorder), recurrent episode, moderate ([name])  HYPERTENSION, BENIGN SYSTEMIC  HLD (hyperlipidemia)  Autonomic orthostatic hypotension  Diastolic CHF ([name])  Anemia, iron deficiency  Anhedonia RECOMMENDATIONS FOR OUTPATIENT  FOLLOW UP: 1. Patient will benefit from outpatient follow-up with psychiatry 2. Please consider Florinef if patient's orthostatic hypotension does not improve with current management DISCHARGE CONDITION: fair Diet recommendation: As before  Admit date: 07/09/2015 Discharge date: pending Recommendations for Outpatient Follow-up:  1. Home health PT/OT ordered, but family declined and preferred outpatient PT/OT 2. Follow up with Neurology in 2 months or sooner if needed 3. Continue low dose aspirin 81mg  daily and plavix 75mg  daily  4. Due to low hemoglobin A1c, stopped metformin and tradjenta  5. Stop hydrocodone. Cut meclizine and hydroxyzine in half until follow up with primary care doctor. PCP to please address oversedation that is concern to both patient and her husband. Discharge Diagnoses:  Principal Problem:  Acute left PCA stroke ([name]) Active Problems:  HYPERTENSION, BENIGN SYSTEMIC  Vitiligo  HLD (hyperlipidemia)  DM (diabetes mellitus), type 2, uncontrolled w/neurologic complication ([name])  CAD (coronary artery disease), native coronary artery  Diastolic CHF ([name])  Anemia, iron deficiency  Acute kidney injury [facility])  Fall  Maxillary fracture ([place])  Hypoxia  Acute CVA (cerebrovascular accident) [facility]) Discharge Condition: stable, improved Diet recommendation: diabetic/healthy heart  Relevant past medical, surgical, family and social history reviewed and updated as indicated. Interim medical history since our last visit reviewed. Allergies and medications reviewed and updated. Current Outpatient Prescriptions on File Prior to Visit  Medication Sig  . aspirin EC 81 MG tablet Take 81 mg by mouth daily.   . clopidogrel (PLAVIX) 75 MG tablet Take 1 tablet (75 mg total) by mouth daily.  Marland Kitchen docusate sodium (COLACE) 100 MG capsule Take 1 capsule (100 mg total) by mouth every 12 (twelve) hours.  Marland Kitchen  DULoxetine (CYMBALTA) 30 MG capsule Take 1 capsule (30 mg total) by  mouth daily.  . famotidine (PEPCID) 20 MG tablet Take 1 tablet (20 mg total) by mouth at bedtime.  . ferrous sulfate 325 (65 FE) MG tablet Take 1 tablet (325 mg total) by mouth daily.  . folic acid (FOLVITE) 1 MG tablet Take 1 tablet (1 mg total) by mouth daily.  Marland Kitchen gabapentin (NEURONTIN) 100 MG capsule TAKE ONE CAPSULE BY MOUTH THREE TIMES DAILY  . hydrocortisone cream 1 % Apply 1 application topically 2 (two) times daily.  . hydrOXYzine (ATARAX/VISTARIL) 10 MG tablet Take 1 tablet (10 mg total) by mouth 3 (three) times daily as needed for itching.  . meclizine (ANTIVERT) 25 MG tablet TAKE ONE TABLET BY MOUTH THREE TIMES DAILY AS NEEDED FOR DIZZINESS  . metoCLOPramide (REGLAN) 10 MG tablet Take 1 tablet (10 mg total) by mouth every 6 (six) hours as needed for nausea or vomiting.  . traMADol (ULTRAM) 50 MG tablet TAKE ONE TABLET BY MOUTH EVERY 6 HOURS AS NEEDED  . Vitamin D, Ergocalciferol, (DRISDOL) 50000 units CAPS capsule TAKE ONE CAPSULE BY MOUTH EVERY 7 DAYS.  Marland Kitchen vitamin B-12 1000 MCG tablet Take 1 tablet (1,000 mcg total) by mouth daily. (Patient not taking: Reported on 08/03/2015)   No current facility-administered medications on file prior to visit.    Review of Systems Per HPI unless specifically indicated in ROS section     Objective:    BP 124/82 mmHg  Pulse 88  Temp(Src) 97.5 F (36.4 C) (Oral)  Wt 140 lb 8 oz (63.73 kg)  Wt Readings from Last 3 Encounters:  08/03/15 140 lb 8 oz (63.73 kg)  07/25/15 141 lb 4 oz (64.071 kg)  07/18/15 152 lb 11.2 oz (69.264 kg)    Physical Exam  Constitutional: She appears well-developed and well-nourished. No distress.  Tired appearing Unsteady getting on exam table  HENT:  Head: Normocephalic and atraumatic.  Mouth/Throat: Oropharynx is clear and moist. No oropharyngeal exudate.  Cardiovascular: Normal rate, regular rhythm, normal heart sounds and intact distal pulses.   No murmur heard. Pulmonary/Chest: Effort normal and breath  sounds normal. No respiratory distress. She has no wheezes. She has no rales.  Musculoskeletal: She exhibits no edema.  Skin: Skin is warm and dry. No rash noted.  Psychiatric: She has a normal mood and affect.  Nursing note and vitals reviewed.      Assessment & Plan:  I advised patient provide our front office staff with best # to reach her at due to multiple failed attempts over last several months. Problem List Items Addressed This Visit    MDD (major depressive disorder), recurrent episode, moderate ([place])    Deteriorated but unsure if patient taking cymbalta 30mg  appropriately. Husband will take over med administration for next 2 wks, RTC 2 wk f/u visit.      DM (diabetes mellitus), type 2, uncontrolled w/neurologic complication ([place])    Last Ac 4.9% - now off metformin and tradjenta. Recheck when next due.       Relevant Medications   atorvastatin (LIPITOR) 40 MG tablet   CVA (cerebral infarction), post CABG - Primary   Relevant Orders   Ambulatory referral to Physical Therapy   Autonomic orthostatic hypotension    ?diabetic autonomic instability vs stroke related. Orthostatics ++ today. Will start midodrine - pt had not been taking.  Reassess at f/u 2wks      Relevant Medications   midodrine (PROAMATINE) 10 MG tablet  atorvastatin (LIPITOR) 40 MG tablet   Other Relevant Orders   Ambulatory referral to Physical Therapy   Nausea without vomiting    ongoinng ?gastroparesis related.  Suggested more regular use of reglan, discussed monitoring for tardive dyskinesia rec clear liquid diet for 24-48 hours to give bowels a rest      Protein-calorie malnutrition ([place])    Continue monitoring albumin and calcium levels.  Discussed glucerna use.      Anemia, iron deficiency    Continues OTC iron daily. Recheck iron stores next visit.      Closed fracture of maxilla ([place])    After fall 06/2015 - continues healing well.      Acute left PCA stroke ([name])    Cryptogenic.  Seems like she had loop recorder implanted.      Relevant Medications   midodrine (PROAMATINE) 10 MG tablet   atorvastatin (LIPITOR) 40 MG tablet   General weakness    gen malaise - unclear if compliant with meds. Restart cymbalta, stat midodririe (refilled) and hold metoprolol for now, and monitor for mprovement.      Relevant Orders   Ambulatory referral to Physical Therapy       Follow up plan: Return in about 2 weeks (around 08/17/2015) for follow up visit.  [name], MD

## 2015-08-04 ENCOUNTER — Encounter: Payer: Self-pay | Admitting: Family Medicine

## 2015-08-04 DIAGNOSIS — R531 Weakness: Secondary | ICD-10-CM | POA: Insufficient documentation

## 2015-08-04 MED ORDER — ATORVASTATIN CALCIUM 40 MG PO TABS: 40.0000 mg | ORAL_TABLET | Freq: Every day | ORAL | Status: DC

## 2015-08-04 NOTE — Assessment & Plan Note (Signed)
After fall 06/2015 - continues healing well.

## 2015-08-04 NOTE — Assessment & Plan Note (Signed)
Continues OTC iron daily. Recheck iron stores next visit.

## 2015-08-04 NOTE — Assessment & Plan Note (Signed)
?  diabetic autonomic instability vs stroke related. Orthostatics ++ today. Will start midodrine - pt had not been taking.  Reassess at f/u 2wks

## 2015-08-04 NOTE — Assessment & Plan Note (Addendum)
Deteriorated but unsure if patient taking cymbalta 30mg  appropriately. Husband will take over med administration for next 2 wks, RTC 2 wk f/u visit.

## 2015-08-04 NOTE — Assessment & Plan Note (Addendum)
Last Ac 4.9% - now off metformin and tradjenta. Recheck when next due.

## 2015-08-04 NOTE — Assessment & Plan Note (Signed)
Continue monitoring albumin and calcium levels.  Discussed glucerna use.

## 2015-08-04 NOTE — Assessment & Plan Note (Signed)
gen malaise - unclear if compliant with meds. Restart cymbalta, stat midodririe (refilled) and hold metoprolol for now, and monitor for mprovement.

## 2015-08-04 NOTE — Assessment & Plan Note (Addendum)
ongoinng ?gastroparesis related.  Suggested more regular use of reglan, discussed monitoring for tardive dyskinesia rec clear liquid diet for 24-48 hours to give bowels a rest

## 2015-08-04 NOTE — Assessment & Plan Note (Signed)
Cryptogenic. Seems like she had loop recorder implanted.

## 2015-08-10 ENCOUNTER — Encounter: Payer: Medicare Other | Admitting: Family Medicine

## 2015-08-11 ENCOUNTER — Encounter: Payer: Self-pay | Admitting: Cardiology

## 2015-08-15 ENCOUNTER — Telehealth: Payer: Self-pay | Admitting: *Deleted

## 2015-08-15 ENCOUNTER — Ambulatory Visit: Payer: Medicare Other | Attending: Family Medicine | Admitting: Physical Therapy

## 2015-08-15 ENCOUNTER — Ambulatory Visit: Payer: Medicare Other | Admitting: Occupational Therapy

## 2015-08-15 DIAGNOSIS — I63532 Cerebral infarction due to unspecified occlusion or stenosis of left posterior cerebral artery: Secondary | ICD-10-CM

## 2015-08-15 DIAGNOSIS — R531 Weakness: Secondary | ICD-10-CM

## 2015-08-15 DIAGNOSIS — W19XXXD Unspecified fall, subsequent encounter: Secondary | ICD-10-CM

## 2015-08-15 NOTE — Telephone Encounter (Signed)
Maria Fox (No DPR) left a message stating that he is caregiver for patient and took her for physical therapy on [address] today. Mr. [name] stated that they will not be going back to this facility because the lady that checked them in was rude and had no common sense. Mr. [name] stated that they want this rescheduled at a facility that would treat customers better than they were treated today.

## 2015-08-15 NOTE — Telephone Encounter (Signed)
I'm sorry he had this experience. New referral placed, please refer to new facility.

## 2015-08-16 ENCOUNTER — Ambulatory Visit (INDEPENDENT_AMBULATORY_CARE_PROVIDER_SITE_OTHER): Payer: Medicare Other | Admitting: *Deleted

## 2015-08-16 DIAGNOSIS — I639 Cerebral infarction, unspecified: Secondary | ICD-10-CM | POA: Diagnosis not present

## 2015-08-17 ENCOUNTER — Ambulatory Visit: Payer: Self-pay | Admitting: Family Medicine

## 2015-08-18 ENCOUNTER — Other Ambulatory Visit: Payer: Self-pay

## 2015-08-18 ENCOUNTER — Encounter: Payer: Self-pay | Admitting: Cardiology

## 2015-08-18 NOTE — Patient Outreach (Signed)
[facility]) Care Management  08/18/2015  TAMONICA JEFFORDS 10-31-1945 [id number]   Numerous attempts to reach Ms. Sainthilaire regarding transition of care, including outreach letter x2. No outreach attempts returned.  Plan: close case. Send letter to primary care.  [name], RN, MSN, [place] Coordinator Cell: [phone]

## 2015-08-18 NOTE — Progress Notes (Signed)
Carelink Summary Report / Loop Recorder 

## 2015-08-21 ENCOUNTER — Ambulatory Visit: Payer: Medicare Other | Admitting: Family Medicine

## 2015-08-24 ENCOUNTER — Ambulatory Visit: Payer: Medicare Other | Admitting: Occupational Therapy

## 2015-08-24 ENCOUNTER — Ambulatory Visit: Payer: Medicare Other

## 2015-08-28 ENCOUNTER — Ambulatory Visit: Payer: Self-pay | Admitting: Family Medicine

## 2015-08-31 ENCOUNTER — Other Ambulatory Visit: Payer: Self-pay | Admitting: Family Medicine

## 2015-08-31 NOTE — Telephone Encounter (Signed)
Ok to refill in Dr. Synthia Innocent absence? Last filled 04/24/15.

## 2015-08-31 NOTE — Telephone Encounter (Signed)
Yes, sent electronically

## 2015-09-15 ENCOUNTER — Encounter: Payer: Medicare Other | Admitting: *Deleted

## 2015-09-19 LAB — CUP PACEART REMOTE DEVICE CHECK: MDC IDC SESS DTM: 20170607200605

## 2015-09-22 ENCOUNTER — Telehealth: Payer: Self-pay | Admitting: Cardiology

## 2015-09-22 NOTE — Telephone Encounter (Signed)
Attempted to call pt about disconnected monitor. No answer and unable to leave a message.  

## 2015-09-28 ENCOUNTER — Encounter: Payer: Self-pay | Admitting: Cardiology

## 2015-10-11 ENCOUNTER — Encounter: Payer: Self-pay | Admitting: Cardiology

## 2015-10-18 ENCOUNTER — Other Ambulatory Visit: Payer: Self-pay | Admitting: Internal Medicine

## 2015-10-18 ENCOUNTER — Other Ambulatory Visit: Payer: Self-pay | Admitting: Family Medicine

## 2015-10-18 NOTE — Telephone Encounter (Signed)
Last filled 08/2015 by Webb Silversmith in your absence--please advise if okay to refill

## 2015-10-18 NOTE — Telephone Encounter (Signed)
Routed to PCP.  Thanks.  

## 2015-10-19 NOTE — Telephone Encounter (Signed)
plz phone in. 

## 2015-10-19 NOTE — Telephone Encounter (Signed)
Rx called in as directed.   

## 2015-11-01 ENCOUNTER — Encounter (HOSPITAL_COMMUNITY): Payer: Self-pay | Admitting: *Deleted

## 2015-11-01 ENCOUNTER — Emergency Department (HOSPITAL_COMMUNITY): Payer: Medicare Other

## 2015-11-01 DIAGNOSIS — I110 Hypertensive heart disease with heart failure: Secondary | ICD-10-CM | POA: Diagnosis not present

## 2015-11-01 DIAGNOSIS — I252 Old myocardial infarction: Secondary | ICD-10-CM | POA: Diagnosis not present

## 2015-11-01 DIAGNOSIS — N390 Urinary tract infection, site not specified: Secondary | ICD-10-CM | POA: Insufficient documentation

## 2015-11-01 DIAGNOSIS — Z8673 Personal history of transient ischemic attack (TIA), and cerebral infarction without residual deficits: Secondary | ICD-10-CM | POA: Insufficient documentation

## 2015-11-01 DIAGNOSIS — R531 Weakness: Secondary | ICD-10-CM | POA: Diagnosis not present

## 2015-11-01 DIAGNOSIS — Z7982 Long term (current) use of aspirin: Secondary | ICD-10-CM | POA: Insufficient documentation

## 2015-11-01 DIAGNOSIS — I5030 Unspecified diastolic (congestive) heart failure: Secondary | ICD-10-CM | POA: Insufficient documentation

## 2015-11-01 DIAGNOSIS — R0602 Shortness of breath: Secondary | ICD-10-CM | POA: Diagnosis not present

## 2015-11-01 DIAGNOSIS — Z951 Presence of aortocoronary bypass graft: Secondary | ICD-10-CM | POA: Insufficient documentation

## 2015-11-01 DIAGNOSIS — E1140 Type 2 diabetes mellitus with diabetic neuropathy, unspecified: Secondary | ICD-10-CM | POA: Diagnosis not present

## 2015-11-01 DIAGNOSIS — I2510 Atherosclerotic heart disease of native coronary artery without angina pectoris: Secondary | ICD-10-CM | POA: Diagnosis not present

## 2015-11-01 DIAGNOSIS — R42 Dizziness and giddiness: Secondary | ICD-10-CM | POA: Insufficient documentation

## 2015-11-01 LAB — CBC
HEMATOCRIT: 40.5 % (ref 36.0–46.0)
Hemoglobin: 13.6 g/dL (ref 12.0–15.0)
MCH: 28 pg (ref 26.0–34.0)
MCHC: 33.6 g/dL (ref 30.0–36.0)
MCV: 83.5 fL (ref 78.0–100.0)
Platelets: 268 10*3/uL (ref 150–400)
RBC: 4.85 MIL/uL (ref 3.87–5.11)
RDW: 15.3 % (ref 11.5–15.5)
WBC: 6.4 10*3/uL (ref 4.0–10.5)

## 2015-11-01 LAB — BASIC METABOLIC PANEL
Anion gap: 7 (ref 5–15)
BUN: 10 mg/dL (ref 6–20)
CHLORIDE: 106 mmol/L (ref 101–111)
CO2: 23 mmol/L (ref 22–32)
Calcium: 9.3 mg/dL (ref 8.9–10.3)
Creatinine, Ser: 1.17 mg/dL — ABNORMAL HIGH (ref 0.44–1.00)
GFR calc Af Amer: 53 mL/min — ABNORMAL LOW (ref >60)
GFR calc non Af Amer: 45 mL/min — ABNORMAL LOW (ref >60)
GLUCOSE: 245 mg/dL — AB (ref 65–99)
POTASSIUM: 3.5 mmol/L (ref 3.5–5.1)
Sodium: 136 mmol/L (ref 135–145)

## 2015-11-01 LAB — I-STAT TROPONIN, ED: Troponin i, poc: 0.01 ng/mL (ref 0.00–0.08)

## 2015-11-01 NOTE — ED Triage Notes (Signed)
Pt c/o SOB, dizziness, and bilateral extremity weakness x 1 week. Reports hx of orthostatic hypotension. Denies CP.

## 2015-11-02 ENCOUNTER — Emergency Department (HOSPITAL_COMMUNITY)
Admission: EM | Admit: 2015-11-02 | Discharge: 2015-11-02 | Disposition: A | Discharge status: Home / Self Care | Payer: Medicare Other | Attending: Emergency Medicine | Admitting: Emergency Medicine

## 2015-11-02 ENCOUNTER — Emergency Department (HOSPITAL_COMMUNITY): Payer: Medicare Other

## 2015-11-02 DIAGNOSIS — R42 Dizziness and giddiness: Secondary | ICD-10-CM | POA: Diagnosis not present

## 2015-11-02 DIAGNOSIS — N390 Urinary tract infection, site not specified: Secondary | ICD-10-CM

## 2015-11-02 DIAGNOSIS — R531 Weakness: Secondary | ICD-10-CM

## 2015-11-02 LAB — URINE MICROSCOPIC-ADD ON

## 2015-11-02 LAB — URINALYSIS, ROUTINE W REFLEX MICROSCOPIC
GLUCOSE, UA: NEGATIVE mg/dL
Hgb urine dipstick: NEGATIVE
KETONES UR: NEGATIVE mg/dL
NITRITE: NEGATIVE
PH: 5 (ref 5.0–8.0)
PROTEIN: NEGATIVE mg/dL
Specific Gravity, Urine: 1.031 — ABNORMAL HIGH (ref 1.005–1.030)

## 2015-11-02 MED ORDER — FOSFOMYCIN TROMETHAMINE 3 G PO PACK: 3.0000 g | PACK | Freq: Once | ORAL | 0 refills | Status: DC

## 2015-11-02 MED ORDER — FOSFOMYCIN TROMETHAMINE 3 G PO PACK
3.0000 g | PACK | Freq: Once | ORAL | Status: AC
  Administered 2015-11-02: 3 g via ORAL
  Filled 2015-11-02: qty 3

## 2015-11-02 MED ORDER — SODIUM CHLORIDE 0.9 % IV BOLUS (SEPSIS)
500.0000 mL | Freq: Once | INTRAVENOUS | Status: AC
  Administered 2015-11-02: 500 mL via INTRAVENOUS

## 2015-11-02 NOTE — ED Notes (Signed)
Pt to CT
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
## 2015-11-03 LAB — URINE CULTURE

## 2015-11-10 ENCOUNTER — Encounter: Payer: Self-pay | Admitting: Family Medicine

## 2015-11-10 ENCOUNTER — Ambulatory Visit (INDEPENDENT_AMBULATORY_CARE_PROVIDER_SITE_OTHER): Payer: Medicare Other | Admitting: Family Medicine

## 2015-11-10 VITALS — BP 122/84 | HR 82 | Temp 97.4°F | Wt 138.0 lb

## 2015-11-10 DIAGNOSIS — E538 Deficiency of other specified B group vitamins: Secondary | ICD-10-CM | POA: Insufficient documentation

## 2015-11-10 DIAGNOSIS — E1140 Type 2 diabetes mellitus with diabetic neuropathy, unspecified: Secondary | ICD-10-CM | POA: Diagnosis not present

## 2015-11-10 DIAGNOSIS — I63532 Cerebral infarction due to unspecified occlusion or stenosis of left posterior cerebral artery: Secondary | ICD-10-CM | POA: Diagnosis not present

## 2015-11-10 DIAGNOSIS — Z23 Encounter for immunization: Secondary | ICD-10-CM

## 2015-11-10 DIAGNOSIS — D509 Iron deficiency anemia, unspecified: Secondary | ICD-10-CM

## 2015-11-10 DIAGNOSIS — IMO0002 Reserved for concepts with insufficient information to code with codable children: Secondary | ICD-10-CM

## 2015-11-10 DIAGNOSIS — F331 Major depressive disorder, recurrent, moderate: Secondary | ICD-10-CM

## 2015-11-10 DIAGNOSIS — E1165 Type 2 diabetes mellitus with hyperglycemia: Secondary | ICD-10-CM

## 2015-11-10 DIAGNOSIS — E785 Hyperlipidemia, unspecified: Secondary | ICD-10-CM

## 2015-11-10 DIAGNOSIS — I10 Essential (primary) hypertension: Secondary | ICD-10-CM

## 2015-11-10 DIAGNOSIS — R110 Nausea: Secondary | ICD-10-CM

## 2015-11-10 LAB — POC URINALSYSI DIPSTICK (AUTOMATED)
Bilirubin, UA: NEGATIVE
GLUCOSE UA: NEGATIVE
Ketones, UA: NEGATIVE
Leukocytes, UA: NEGATIVE
Nitrite, UA: NEGATIVE
PH UA: 6
Protein, UA: NEGATIVE
RBC UA: NEGATIVE
SPEC GRAV UA: 1.02
UROBILINOGEN UA: 0.2

## 2015-11-10 MED ORDER — CYANOCOBALAMIN 1000 MCG/ML IJ SOLN
1000.0000 ug | Freq: Once | INTRAMUSCULAR | Status: AC
  Administered 2015-11-10: 1000 ug via INTRAMUSCULAR

## 2015-11-10 MED ORDER — DULOXETINE HCL 30 MG PO CPEP: 30.0000 mg | ORAL_CAPSULE | Freq: Every day | ORAL | 6 refills | Status: DC

## 2015-11-10 NOTE — Progress Notes (Signed)
Pre visit review using our clinic review tool, if applicable. No additional management support is needed unless otherwise documented below in the visit note. 

## 2015-11-10 NOTE — Progress Notes (Signed)
BP 122/84 (BP Location: Right Arm, Patient Position: Sitting, Cuff Size: Normal)   Pulse 82   Temp 97.4 F (36.3 C) (Oral)   Wt 138 lb (62.6 kg)   SpO2 97%   BMI 25.24 kg/m    CC: f/u visit Subjective:    Patient ID: Maria Fox, female    DOB: [birth year], 70 y.o.   MRN: [id number]  HPI: Maria Fox is a 70 y.o. female presenting on 11/10/2015 for Follow-up (Discuss medications. Go over health in general)   See prior note for details. Recent complicated medical course with multiple hospitalizations. I asked her to return in 2 weeks. It has been 3 months.   Here with [name] who is partner and caregiver.   Recent ER visit 11/02/2015 for generalized weakness and possible UTI. Note reviewed. Treated for UTI with what sounds like fosfomycin. Concern for worsening depression. Unsure if she ever took cymbalta. Malaise, lack of motivation, general apathy.   Denies fevers/chills, abd pain, chest pain, headaches, dyspnea, or cough. No weight loss or appetite changes.   Relevant past medical, surgical, family and social history reviewed and updated as indicated. Interim medical history since our last visit reviewed. Allergies and medications reviewed and updated. Current Outpatient Prescriptions on File Prior to Visit  Medication Sig  . aspirin EC 81 MG tablet Take 81 mg by mouth daily.   Marland Kitchen atorvastatin (LIPITOR) 40 MG tablet Take 1 tablet (40 mg total) by mouth at bedtime.  . clopidogrel (PLAVIX) 75 MG tablet Take 1 tablet (75 mg total) by mouth daily.  Marland Kitchen docusate sodium (COLACE) 100 MG capsule Take 1 capsule (100 mg total) by mouth every 12 (twelve) hours.  . ferrous sulfate 325 (65 FE) MG EC tablet TAKE ONE TABLET BY MOUTH  DAILY  . folic acid (FOLVITE) 1 MG tablet TAKE ONE TABLET BY MOUTH  DAILY  . gabapentin (NEURONTIN) 100 MG capsule TAKE ONE CAPSULE BY MOUTH THREE TIMES DAILY  . hydrocortisone cream 1 % Apply 1 application topically 2 (two) times daily.  . meclizine  (ANTIVERT) 25 MG tablet TAKE ONE TABLET BY MOUTH THREE TIMES DAILY AS NEEDED FOR DIZZINESS  . metoCLOPramide (REGLAN) 10 MG tablet Take 1 tablet (10 mg total) by mouth every 6 (six) hours as needed for nausea or vomiting.  . midodrine (PROAMATINE) 10 MG tablet Take 1 tablet (10 mg total) by mouth 3 (three) times daily with meals.  . traMADol (ULTRAM) 50 MG tablet TAKE ONE TABLET BY MOUTH EVERY 6 HOURS AS NEEDED  . Vitamin D, Ergocalciferol, (DRISDOL) 50000 units CAPS capsule TAKE ONE CAPSULE BY MOUTH ONCE A WEEK   No current facility-administered medications on file prior to visit.     Review of Systems Per HPI unless specifically indicated in ROS section     Objective:    BP 122/84 (BP Location: Right Arm, Patient Position: Sitting, Cuff Size: Normal)   Pulse 82   Temp 97.4 F (36.3 C) (Oral)   Wt 138 lb (62.6 kg)   SpO2 97%   BMI 25.24 kg/m   Wt Readings from Last 3 Encounters:  11/10/15 138 lb (62.6 kg)  11/01/15 145 lb (65.8 kg)  08/03/15 140 lb 8 oz (63.7 kg)    Physical Exam  Constitutional: She appears well-developed and well-nourished. No distress.  HENT:  Mouth/Throat: Oropharynx is clear and moist. No oropharyngeal exudate.  Cardiovascular: Normal rate, regular rhythm, normal heart sounds and intact distal pulses.   No murmur heard. Pulmonary/Chest:  Effort normal and breath sounds normal. No respiratory distress. She has no wheezes. She has no rales.  Musculoskeletal: She exhibits no edema.  Psychiatric: Her speech is normal and behavior is normal. Thought content normal. Her affect is blunt. She exhibits a depressed mood.  Nursing note and vitals reviewed.  Results for orders placed or performed in visit on 11/10/15  Fructosamine  Result Value Ref Range   Fructosamine    Hemoglobin A1c  Result Value Ref Range   Hgb A1c MFr Bld 7.4 (H) <5.7 %   Mean Plasma Glucose 166 mg/dL  POCT Urinalysis Dipstick (Automated)  Result Value Ref Range   Color, UA LIGHT  YELLOW    Clarity, UA Clear    Glucose, UA negative    Bilirubin, UA negative    Ketones, UA negtaive    Spec Grav, UA 1.020    Blood, UA negative    pH, UA 6.0    Protein, UA negative    Urobilinogen, UA 0.2    Nitrite, UA negative    Leukocytes, UA Negative Negative   Lab Results  Component Value Date   TSH 1.355 07/26/2015    Lab Results  Component Value Date   VITAMINB12 191 07/09/2015    Lab Results  Component Value Date   WBC 6.4 11/01/2015   HGB 13.6 11/01/2015   HCT 40.5 11/01/2015   MCV 83.5 11/01/2015   PLT 268 11/01/2015    Lab Results  Component Value Date   CREATININE 1.17 (H) 11/01/2015       Assessment & Plan:   Problem List Items Addressed This Visit    Anemia, iron deficiency    Found during latest hospitalization. Latest CBC WNL. Pt has been compliant with oral iron daily.       Relevant Medications   cyanocobalamin ((VITAMIN B-12)) injection 1,000 mcg (Completed)   B12 deficiency    Found during latest hospitalization. Repeat B12 shot today. Then will recommend monthly B12 shots. May be contributing to gen malaise/fatigue.       Relevant Medications   cyanocobalamin ((VITAMIN B-12)) injection 1,000 mcg (Completed)   DM (diabetes mellitus), type 2, uncontrolled w/neurologic complication ([name]) - Primary    A1c has crept up - will restart metformin       Relevant Orders   Fructosamine (Completed)   Hemoglobin A1c (Completed)   POCT Urinalysis Dipstick (Automated) (Completed)   HLD (hyperlipidemia)    lipitor recently increased to 40mg  daily. Continue.       HYPERTENSION, BENIGN SYSTEMIC    Stable BP readings. Recent orthostatis, now on midodrine.      MDD (major depressive disorder), recurrent episode, moderate ([name])    Anticipate contributing to general malaise, fatigue, weakness endorsed as exam overall benign today.  Unclear if she ever took cymbalta, but I only prescribed enough for 2 months (in expectation she would return for  f/u in that time). rec restart cymbalta and 30mg  daily sent to pharmacy with 6 refills. Pt and partner agree with plan. RTC 1 mo close f/u       Relevant Medications   DULoxetine (CYMBALTA) 30 MG capsule   Nausea without vomiting    This has largely resolved.        Other Visit Diagnoses    Need for influenza vaccination       Relevant Orders   Flu Vaccine QUAD 36+ mos PF IM (Fluarix & Fluzone Quad PF) (Completed)       Follow up plan: Return in  about 4 weeks (around 12/08/2015), or as needed, for follow up visit.  [name], MD

## 2015-11-10 NOTE — Patient Instructions (Addendum)
Flu shot today. Urine checked today.  B12 shot today. A1c and fructosamine levels checked today (labwork).  Restart cymbalta 30mg  daily - sent to pharmacy.  Return in 1 month for follow up visit.

## 2015-11-11 ENCOUNTER — Other Ambulatory Visit: Payer: Self-pay | Admitting: Family Medicine

## 2015-11-11 LAB — HEMOGLOBIN A1C
Hgb A1c MFr Bld: 7.4 % — ABNORMAL HIGH (ref <5.7)
Mean Plasma Glucose: 166 mg/dL

## 2015-11-11 MED ORDER — METFORMIN HCL 500 MG PO TABS: 500.0000 mg | ORAL_TABLET | Freq: Every day | ORAL | 3 refills | Status: DC

## 2015-11-11 NOTE — Assessment & Plan Note (Addendum)
Found during latest hospitalization. Latest CBC WNL. Pt has been compliant with oral iron daily.

## 2015-11-11 NOTE — Assessment & Plan Note (Signed)
This has largely resolved.  

## 2015-11-11 NOTE — Assessment & Plan Note (Signed)
Stable BP readings. Recent orthostatis, now on midodrine.

## 2015-11-11 NOTE — Assessment & Plan Note (Signed)
Found during latest hospitalization. Repeat B12 shot today. Then will recommend monthly B12 shots. May be contributing to gen malaise/fatigue.

## 2015-11-11 NOTE — Assessment & Plan Note (Signed)
lipitor recently increased to 40mg  daily. Continue.

## 2015-11-11 NOTE — Assessment & Plan Note (Addendum)
Anticipate contributing to general malaise, fatigue, weakness endorsed as exam overall benign today.  Unclear if she ever took cymbalta, but I only prescribed enough for 2 months (in expectation she would return for f/u in that time). rec restart cymbalta and 30mg  daily sent to pharmacy with 6 refills. Pt and partner agree with plan. RTC 1 mo close f/u

## 2015-11-11 NOTE — Assessment & Plan Note (Signed)
A1c has crept up - will restart metformin

## 2015-11-14 LAB — FRUCTOSAMINE: Fructosamine: 302 umol/L — ABNORMAL HIGH (ref 190–270)

## 2015-11-17 ENCOUNTER — Telehealth: Payer: Self-pay | Admitting: Family Medicine

## 2015-11-17 NOTE — Telephone Encounter (Signed)
Pt returned your call Best number [phone] Pt stated if she doesn't answer phone please leave message

## 2015-11-17 NOTE — Telephone Encounter (Signed)
Patient returned Kim's call. °

## 2015-11-17 NOTE — Telephone Encounter (Signed)
Spoke with patient about results.

## 2015-11-30 ENCOUNTER — Other Ambulatory Visit: Payer: Self-pay | Admitting: Internal Medicine

## 2015-11-30 ENCOUNTER — Other Ambulatory Visit: Payer: Self-pay | Admitting: Family Medicine

## 2015-11-30 NOTE — Telephone Encounter (Signed)
Last filled 10/19/2015--please advise 

## 2015-11-30 NOTE — Telephone Encounter (Signed)
plz phone in tramadol. 

## 2015-11-30 NOTE — Telephone Encounter (Signed)
Tramadol Last Filled 10-20-15 #30 Last OV 11-10-15 Next OV 12-11-15

## 2015-12-01 NOTE — Telephone Encounter (Signed)
Rx called in as directed.   

## 2015-12-11 ENCOUNTER — Ambulatory Visit: Payer: Self-pay | Admitting: Family Medicine

## 2015-12-21 ENCOUNTER — Encounter: Payer: Self-pay | Admitting: Family Medicine

## 2015-12-21 ENCOUNTER — Ambulatory Visit (INDEPENDENT_AMBULATORY_CARE_PROVIDER_SITE_OTHER): Payer: Medicare Other | Admitting: Family Medicine

## 2015-12-21 VITALS — BP 138/80 | HR 88 | Temp 97.6°F | Wt 130.5 lb

## 2015-12-21 DIAGNOSIS — R531 Weakness: Secondary | ICD-10-CM

## 2015-12-21 DIAGNOSIS — F331 Major depressive disorder, recurrent, moderate: Secondary | ICD-10-CM

## 2015-12-21 DIAGNOSIS — E538 Deficiency of other specified B group vitamins: Secondary | ICD-10-CM | POA: Diagnosis not present

## 2015-12-21 DIAGNOSIS — I63532 Cerebral infarction due to unspecified occlusion or stenosis of left posterior cerebral artery: Secondary | ICD-10-CM | POA: Diagnosis not present

## 2015-12-21 MED ORDER — CITALOPRAM HYDROBROMIDE 10 MG PO TABS: 10.0000 mg | ORAL_TABLET | Freq: Every day | ORAL | 6 refills | Status: DC
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
## 2015-12-21 NOTE — Progress Notes (Signed)
Pre visit review using our clinic review tool, if applicable. No additional management support is needed unless otherwise documented below in the visit note. 

## 2015-12-21 NOTE — Patient Instructions (Addendum)
Let's stop cymbalta and start celexa 10mg  daily. New medicine at pharmacy.  # for counselor provided today.  Use walker to help you walk.  Return as needed or in 1 month for follow up visit.

## 2015-12-23 NOTE — Assessment & Plan Note (Signed)
Forgot B12 shot - will need monthly. Likely contributing to weakness/fatigue

## 2015-12-23 NOTE — Assessment & Plan Note (Signed)
Chronic issue, no improvement with cymbalta 30mg . Will stop and start celexa. Pt prior on zoloft, doesn't remember effect.

## 2015-12-23 NOTE — Assessment & Plan Note (Addendum)
Ongoing trouble, anticipate only partly related to MDD. Recommended regular walker use. Ongoing weight loss noted.  Some concern for parkinsonisms. Continue to monitor, consider neuro referral for eval if no improvement with new antidepressant regimen.

## 2016-01-18 ENCOUNTER — Ambulatory Visit: Payer: Self-pay | Admitting: Family Medicine

## 2016-01-22 ENCOUNTER — Ambulatory Visit: Payer: Medicare Other | Admitting: Family Medicine

## 2016-02-08 ENCOUNTER — Ambulatory Visit: Payer: Medicare Other | Admitting: Family Medicine

## 2016-02-16 ENCOUNTER — Emergency Department (HOSPITAL_COMMUNITY)
Admission: EM | Admit: 2016-02-16 | Discharge: 2016-02-18 | Disposition: A | Discharge status: Other Acute Inpatient Hospital | Payer: Medicare Other | Attending: Emergency Medicine | Admitting: Emergency Medicine

## 2016-02-16 ENCOUNTER — Emergency Department (HOSPITAL_COMMUNITY): Payer: Medicare Other

## 2016-02-16 DIAGNOSIS — Z8673 Personal history of transient ischemic attack (TIA), and cerebral infarction without residual deficits: Secondary | ICD-10-CM | POA: Insufficient documentation

## 2016-02-16 DIAGNOSIS — Z951 Presence of aortocoronary bypass graft: Secondary | ICD-10-CM | POA: Insufficient documentation

## 2016-02-16 DIAGNOSIS — Z7984 Long term (current) use of oral hypoglycemic drugs: Secondary | ICD-10-CM | POA: Diagnosis not present

## 2016-02-16 DIAGNOSIS — E119 Type 2 diabetes mellitus without complications: Secondary | ICD-10-CM | POA: Insufficient documentation

## 2016-02-16 DIAGNOSIS — R4182 Altered mental status, unspecified: Secondary | ICD-10-CM | POA: Diagnosis not present

## 2016-02-16 DIAGNOSIS — F329 Major depressive disorder, single episode, unspecified: Secondary | ICD-10-CM | POA: Diagnosis not present

## 2016-02-16 DIAGNOSIS — I110 Hypertensive heart disease with heart failure: Secondary | ICD-10-CM | POA: Diagnosis not present

## 2016-02-16 DIAGNOSIS — Z79899 Other long term (current) drug therapy: Secondary | ICD-10-CM | POA: Insufficient documentation

## 2016-02-16 DIAGNOSIS — I252 Old myocardial infarction: Secondary | ICD-10-CM | POA: Diagnosis not present

## 2016-02-16 DIAGNOSIS — I2510 Atherosclerotic heart disease of native coronary artery without angina pectoris: Secondary | ICD-10-CM | POA: Insufficient documentation

## 2016-02-16 DIAGNOSIS — I5030 Unspecified diastolic (congestive) heart failure: Secondary | ICD-10-CM | POA: Insufficient documentation

## 2016-02-16 DIAGNOSIS — Z7982 Long term (current) use of aspirin: Secondary | ICD-10-CM | POA: Diagnosis not present

## 2016-02-16 DIAGNOSIS — F32A Depression, unspecified: Secondary | ICD-10-CM

## 2016-02-16 LAB — SALICYLATE LEVEL

## 2016-02-16 LAB — RAPID URINE DRUG SCREEN, HOSP PERFORMED
Amphetamines: NOT DETECTED
BARBITURATES: NOT DETECTED
Benzodiazepines: NOT DETECTED
Cocaine: NOT DETECTED
Opiates: NOT DETECTED
TETRAHYDROCANNABINOL: NOT DETECTED

## 2016-02-16 LAB — COMPREHENSIVE METABOLIC PANEL
ALK PHOS: 86 U/L (ref 38–126)
ALT: 14 U/L (ref 14–54)
ANION GAP: 12 (ref 5–15)
AST: 22 U/L (ref 15–41)
Albumin: 4.3 g/dL (ref 3.5–5.0)
BUN: 14 mg/dL (ref 6–20)
CO2: 22 mmol/L (ref 22–32)
CREATININE: 1.18 mg/dL — AB (ref 0.44–1.00)
Calcium: 10.1 mg/dL (ref 8.9–10.3)
Chloride: 105 mmol/L (ref 101–111)
GFR calc non Af Amer: 45 mL/min — ABNORMAL LOW (ref >60)
GFR, EST AFRICAN AMERICAN: 52 mL/min — AB (ref >60)
GLUCOSE: 174 mg/dL — AB (ref 65–99)
Potassium: 4.1 mmol/L (ref 3.5–5.1)
SODIUM: 139 mmol/L (ref 135–145)
TOTAL PROTEIN: 7.6 g/dL (ref 6.5–8.1)
Total Bilirubin: 0.9 mg/dL (ref 0.3–1.2)

## 2016-02-16 LAB — ETHANOL: Alcohol, Ethyl (B): <5 mg/dL (ref <5)

## 2016-02-16 LAB — CBC
HEMATOCRIT: 45.8 % (ref 36.0–46.0)
HEMOGLOBIN: 15.9 g/dL — AB (ref 12.0–15.0)
MCH: 30.3 pg (ref 26.0–34.0)
MCHC: 34.7 g/dL (ref 30.0–36.0)
MCV: 87.2 fL (ref 78.0–100.0)
Platelets: 329 10*3/uL (ref 150–400)
RBC: 5.25 MIL/uL — ABNORMAL HIGH (ref 3.87–5.11)
RDW: 13.6 % (ref 11.5–15.5)
WBC: 11.7 10*3/uL — ABNORMAL HIGH (ref 4.0–10.5)

## 2016-02-16 LAB — URINALYSIS, ROUTINE W REFLEX MICROSCOPIC
BILIRUBIN URINE: NEGATIVE
Glucose, UA: NEGATIVE mg/dL
HGB URINE DIPSTICK: NEGATIVE
Ketones, ur: 5 mg/dL — AB
NITRITE: NEGATIVE
PH: 5 (ref 5.0–8.0)
Protein, ur: NEGATIVE mg/dL
SPECIFIC GRAVITY, URINE: 1.009 (ref 1.005–1.030)
Squamous Epithelial / LPF: NONE SEEN

## 2016-02-16 LAB — ACETAMINOPHEN LEVEL: Acetaminophen (Tylenol), Serum: <10 ug/mL — ABNORMAL LOW (ref 10–30)

## 2016-02-16 MED ORDER — LORAZEPAM 1 MG PO TABS
1.0000 mg | ORAL_TABLET | Freq: Once | ORAL | Status: AC
  Administered 2016-02-16: 1 mg via ORAL
  Filled 2016-02-16: qty 2

## 2016-02-16 MED ORDER — SODIUM CHLORIDE 0.9 % IV BOLUS (SEPSIS)
500.0000 mL | Freq: Once | INTRAVENOUS | Status: AC
Start: 2016-02-16 — End: 2016-02-16
  Administered 2016-02-16: 500 mL via INTRAVENOUS

## 2016-02-16 MED ORDER — ACETAMINOPHEN 325 MG PO TABS
650.0000 mg | ORAL_TABLET | ORAL | Status: DC | PRN
  Administered 2016-02-16: 650 mg via ORAL
  Filled 2016-02-16: qty 2

## 2016-02-16 NOTE — ED Notes (Signed)
A call has been made for tts however the has not been cleared to move her back to c and there is no room available to do the tts  They will need a callback when shes cleared and has a room for that  Chg knows
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
## 2016-02-16 NOTE — BH Assessment (Signed)
TTS contacted Chris,RN for pt assessment setup. Clinician informed pt has yet to be cleared medically and is also unable to be relocated to room at this moment for assessment. TTS consult request to be resubmitted when appropriate.
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
## 2016-02-16 NOTE — ED Notes (Signed)
Lunch tray ordered; regular diet, no sharps 

## 2016-02-16 NOTE — ED Notes (Signed)
Sitter has been at the bedside since 1900  Waiting for the pt to be cleared medically

## 2016-02-17 MED ORDER — LORAZEPAM 1 MG PO TABS
1.0000 mg | ORAL_TABLET | Freq: Once | ORAL | Status: AC
  Administered 2016-02-17: 1 mg via ORAL
  Filled 2016-02-17: qty 1

## 2016-02-17 MED ORDER — DULOXETINE HCL 30 MG PO CPEP
30.0000 mg | ORAL_CAPSULE | Freq: Every day | ORAL | Status: DC
  Administered 2016-02-17 – 2016-02-18 (×2): 30 mg via ORAL
  Filled 2016-02-17 (×2): qty 1

## 2016-02-17 MED ORDER — CLOPIDOGREL BISULFATE 75 MG PO TABS
75.0000 mg | ORAL_TABLET | Freq: Every day | ORAL | Status: DC
  Administered 2016-02-17 – 2016-02-18 (×2): 75 mg via ORAL
  Filled 2016-02-17 (×2): qty 1

## 2016-02-17 MED ORDER — METFORMIN HCL 500 MG PO TABS
500.0000 mg | ORAL_TABLET | Freq: Two times a day (BID) | ORAL | Status: DC
  Administered 2016-02-17 – 2016-02-18 (×3): 500 mg via ORAL
  Filled 2016-02-17 (×3): qty 1

## 2016-02-17 MED ORDER — GABAPENTIN 100 MG PO CAPS
100.0000 mg | ORAL_CAPSULE | Freq: Three times a day (TID) | ORAL | Status: DC
  Administered 2016-02-17 – 2016-02-18 (×5): 100 mg via ORAL
  Filled 2016-02-17 (×5): qty 1

## 2016-02-17 NOTE — ED Notes (Addendum)
Sitting with pt while sitter went to lunch

## 2016-02-17 NOTE — ED Notes (Signed)
Dinner ordered 

## 2016-02-17 NOTE — ED Notes (Signed)
Contacted [facility] as to pt's outstanding need for her TTS assessment. Placed TTS device at bedside.

## 2016-02-17 NOTE — ED Notes (Signed)
TTS assessment underway.

## 2016-02-17 NOTE — ED Notes (Signed)
Ordered breakfast tray for pt, per request. Omlette (NO mushrooms d/t allergy), bacon, Rice Krispies, orange juice, coffee (4 creams, no sugar)

## 2016-02-17 NOTE — ED Notes (Signed)
Pt requesting her home medications,  Dr. Regenia Skeeter made aware and meds ordered
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
## 2016-02-17 NOTE — ED Notes (Signed)
Patient sound asleep at this time.  To give ativan with bedtime meds.

## 2016-02-17 NOTE — ED Notes (Signed)
Sitter assisting pt with taking a shower and getting into a fresh set of paper scrubs.

## 2016-02-17 NOTE — ED Notes (Signed)
Pt informed oncoming sitter that she is uncomfortable with having a female sitter. Switched Neurosurgeon for [facility] to this room, and had this sitter go there. Sitter informed Staffing office.

## 2016-02-17 NOTE — ED Notes (Signed)
Pt's lunch ordered 

## 2016-02-17 NOTE — ED Notes (Signed)
Pt belongings inventoried and stored. Medications given to pharmacy. 3 bags placed in storage bin. No valuables with security.

## 2016-02-17 NOTE — Progress Notes (Signed)
Patient has been referred to the following geriatric psych inpatient facilities: [facility], [facility] geriatric unit, [facility], [facility], [facility], [place].  Left voicemail with [facility] inquiring bed status. Writer left voicemail with call back number.  CSW will continue to seek placement.  Verlon Setting, [place] Disposition staff 02/17/2016 11:39 AM

## 2016-02-18 DIAGNOSIS — F329 Major depressive disorder, single episode, unspecified: Secondary | ICD-10-CM | POA: Diagnosis not present

## 2016-02-18 DIAGNOSIS — R45850 Homicidal ideations: Secondary | ICD-10-CM | POA: Diagnosis present

## 2016-02-18 DIAGNOSIS — F323 Major depressive disorder, single episode, severe with psychotic features: Secondary | ICD-10-CM | POA: Diagnosis not present

## 2016-02-18 DIAGNOSIS — G43909 Migraine, unspecified, not intractable, without status migrainosus: Secondary | ICD-10-CM | POA: Diagnosis present

## 2016-02-18 DIAGNOSIS — L309 Dermatitis, unspecified: Secondary | ICD-10-CM | POA: Diagnosis present

## 2016-02-18 DIAGNOSIS — E119 Type 2 diabetes mellitus without complications: Secondary | ICD-10-CM | POA: Diagnosis not present

## 2016-02-18 DIAGNOSIS — I2510 Atherosclerotic heart disease of native coronary artery without angina pectoris: Secondary | ICD-10-CM | POA: Diagnosis not present

## 2016-02-18 DIAGNOSIS — R45851 Suicidal ideations: Secondary | ICD-10-CM | POA: Diagnosis not present

## 2016-02-18 DIAGNOSIS — Z951 Presence of aortocoronary bypass graft: Secondary | ICD-10-CM | POA: Diagnosis not present

## 2016-02-18 DIAGNOSIS — I252 Old myocardial infarction: Secondary | ICD-10-CM | POA: Diagnosis not present

## 2016-02-18 DIAGNOSIS — F29 Unspecified psychosis not due to a substance or known physiological condition: Secondary | ICD-10-CM | POA: Diagnosis not present

## 2016-02-18 DIAGNOSIS — I110 Hypertensive heart disease with heart failure: Secondary | ICD-10-CM | POA: Diagnosis not present

## 2016-02-18 DIAGNOSIS — F333 Major depressive disorder, recurrent, severe with psychotic symptoms: Secondary | ICD-10-CM | POA: Diagnosis not present

## 2016-02-18 DIAGNOSIS — E785 Hyperlipidemia, unspecified: Secondary | ICD-10-CM | POA: Diagnosis present

## 2016-02-18 DIAGNOSIS — Z79899 Other long term (current) drug therapy: Secondary | ICD-10-CM | POA: Diagnosis not present

## 2016-02-18 DIAGNOSIS — I509 Heart failure, unspecified: Secondary | ICD-10-CM | POA: Diagnosis present

## 2016-02-18 DIAGNOSIS — I5030 Unspecified diastolic (congestive) heart failure: Secondary | ICD-10-CM | POA: Diagnosis not present

## 2016-02-18 NOTE — ED Notes (Signed)
Attempted to call report to [facility]. RN will call back.

## 2016-02-18 NOTE — Progress Notes (Signed)
Felicia at [facility] advised Probation officer to re-fax patient's referral.  Verlon Setting, [place] Disposition staff 02/18/2016 4:27 PM

## 2016-02-18 NOTE — ED Notes (Signed)
Pt visitor at bedside.

## 2016-02-18 NOTE — BHH Counselor (Signed)
Pt reassessed.  Pt endorsed continued suicidal ideation and auditory/visual hallucinations.  Per Starleen Arms, NP Pt continues to meet inpatient criteria.

## 2016-02-18 NOTE — ED Notes (Signed)
Spoke to Assessment counselors- patient wanting to go to [facility] for treatment despite having bed available at [facility]. Per Mendel Ryder patient needs to be IVC'ed if deemed necessary.

## 2016-02-18 NOTE — ED Notes (Signed)
Behavior Health called to do a reassessment of the patient. TTS machine at patient bedside

## 2016-02-18 NOTE — ED Notes (Signed)
Pt departed for [facility] in custody of [employer]. Pt in NAD, and escorted by sitter & transporter in wheelchair. All pt belongings (3 pt bags, 1 bag from pharmacy) given to [place] transporter.

## 2016-02-18 NOTE — ED Notes (Signed)
Patient has bed at [facility]- Patient informed of treatment plan and states she does not want to go due to location and inconvenient for family.Maria Fox- Assessment team informed. RN discussed treatment options and spoke to family about plan of care. Fiance is speaking to patient to come up with decision for transfer.

## 2016-02-18 NOTE — BHH Counselor (Signed)
[facility] accepting Pt.  Accepting MD is [employer].  Please call report to [phone] when Pt ready to transport.

## 2016-03-05 ENCOUNTER — Emergency Department (HOSPITAL_COMMUNITY): Payer: Medicare Other

## 2016-03-05 ENCOUNTER — Emergency Department (HOSPITAL_COMMUNITY)
Admission: EM | Admit: 2016-03-05 | Discharge: 2016-03-06 | Disposition: A | Discharge status: Other Acute Inpatient Hospital | Payer: Medicare Other | Attending: Emergency Medicine | Admitting: Emergency Medicine

## 2016-03-05 ENCOUNTER — Ambulatory Visit: Payer: Self-pay | Admitting: Family Medicine

## 2016-03-05 DIAGNOSIS — Z880 Allergy status to penicillin: Secondary | ICD-10-CM | POA: Diagnosis not present

## 2016-03-05 DIAGNOSIS — F332 Major depressive disorder, recurrent severe without psychotic features: Secondary | ICD-10-CM | POA: Diagnosis not present

## 2016-03-05 DIAGNOSIS — I2510 Atherosclerotic heart disease of native coronary artery without angina pectoris: Secondary | ICD-10-CM | POA: Insufficient documentation

## 2016-03-05 DIAGNOSIS — Z951 Presence of aortocoronary bypass graft: Secondary | ICD-10-CM | POA: Diagnosis not present

## 2016-03-05 DIAGNOSIS — E1149 Type 2 diabetes mellitus with other diabetic neurological complication: Secondary | ICD-10-CM | POA: Insufficient documentation

## 2016-03-05 DIAGNOSIS — Z8673 Personal history of transient ischemic attack (TIA), and cerebral infarction without residual deficits: Secondary | ICD-10-CM | POA: Insufficient documentation

## 2016-03-05 DIAGNOSIS — I110 Hypertensive heart disease with heart failure: Secondary | ICD-10-CM | POA: Insufficient documentation

## 2016-03-05 DIAGNOSIS — R443 Hallucinations, unspecified: Secondary | ICD-10-CM | POA: Diagnosis not present

## 2016-03-05 DIAGNOSIS — Z91018 Allergy to other foods: Secondary | ICD-10-CM | POA: Diagnosis not present

## 2016-03-05 DIAGNOSIS — R079 Chest pain, unspecified: Secondary | ICD-10-CM | POA: Diagnosis not present

## 2016-03-05 DIAGNOSIS — Z882 Allergy status to sulfonamides status: Secondary | ICD-10-CM | POA: Diagnosis not present

## 2016-03-05 DIAGNOSIS — Z8249 Family history of ischemic heart disease and other diseases of the circulatory system: Secondary | ICD-10-CM | POA: Diagnosis not present

## 2016-03-05 DIAGNOSIS — Z833 Family history of diabetes mellitus: Secondary | ICD-10-CM | POA: Diagnosis not present

## 2016-03-05 DIAGNOSIS — Z801 Family history of malignant neoplasm of trachea, bronchus and lung: Secondary | ICD-10-CM | POA: Diagnosis not present

## 2016-03-05 DIAGNOSIS — Z825 Family history of asthma and other chronic lower respiratory diseases: Secondary | ICD-10-CM | POA: Diagnosis not present

## 2016-03-05 DIAGNOSIS — F331 Major depressive disorder, recurrent, moderate: Secondary | ICD-10-CM | POA: Diagnosis present

## 2016-03-05 DIAGNOSIS — Z7982 Long term (current) use of aspirin: Secondary | ICD-10-CM | POA: Diagnosis not present

## 2016-03-05 DIAGNOSIS — Z7984 Long term (current) use of oral hypoglycemic drugs: Secondary | ICD-10-CM | POA: Insufficient documentation

## 2016-03-05 DIAGNOSIS — R45851 Suicidal ideations: Secondary | ICD-10-CM

## 2016-03-05 DIAGNOSIS — Z9889 Other specified postprocedural states: Secondary | ICD-10-CM | POA: Diagnosis not present

## 2016-03-05 DIAGNOSIS — Z79899 Other long term (current) drug therapy: Secondary | ICD-10-CM | POA: Insufficient documentation

## 2016-03-05 DIAGNOSIS — F333 Major depressive disorder, recurrent, severe with psychotic symptoms: Secondary | ICD-10-CM | POA: Insufficient documentation

## 2016-03-05 DIAGNOSIS — I5030 Unspecified diastolic (congestive) heart failure: Secondary | ICD-10-CM | POA: Insufficient documentation

## 2016-03-05 DIAGNOSIS — I252 Old myocardial infarction: Secondary | ICD-10-CM | POA: Diagnosis not present

## 2016-03-05 LAB — URINALYSIS, ROUTINE W REFLEX MICROSCOPIC
Bilirubin Urine: NEGATIVE
Glucose, UA: NEGATIVE mg/dL
Ketones, ur: NEGATIVE mg/dL
NITRITE: NEGATIVE
PH: 5 (ref 5.0–8.0)
Protein, ur: 30 mg/dL — AB
SPECIFIC GRAVITY, URINE: 1.023 (ref 1.005–1.030)

## 2016-03-05 LAB — CBC
HCT: 42.7 % (ref 36.0–46.0)
HEMOGLOBIN: 14.9 g/dL (ref 12.0–15.0)
MCH: 30.2 pg (ref 26.0–34.0)
MCHC: 34.9 g/dL (ref 30.0–36.0)
MCV: 86.4 fL (ref 78.0–100.0)
Platelets: 312 10*3/uL (ref 150–400)
RBC: 4.94 MIL/uL (ref 3.87–5.11)
RDW: 13.3 % (ref 11.5–15.5)
WBC: 11.1 10*3/uL — ABNORMAL HIGH (ref 4.0–10.5)

## 2016-03-05 LAB — COMPREHENSIVE METABOLIC PANEL
ALK PHOS: 90 U/L (ref 38–126)
ALT: 13 U/L — AB (ref 14–54)
ANION GAP: 14 (ref 5–15)
AST: 20 U/L (ref 15–41)
Albumin: 4.2 g/dL (ref 3.5–5.0)
BILIRUBIN TOTAL: 0.8 mg/dL (ref 0.3–1.2)
BUN: 13 mg/dL (ref 6–20)
CALCIUM: 10 mg/dL (ref 8.9–10.3)
CO2: 21 mmol/L — AB (ref 22–32)
Chloride: 106 mmol/L (ref 101–111)
Creatinine, Ser: 1.17 mg/dL — ABNORMAL HIGH (ref 0.44–1.00)
GFR, EST AFRICAN AMERICAN: 53 mL/min — AB (ref >60)
GFR, EST NON AFRICAN AMERICAN: 45 mL/min — AB (ref >60)
Glucose, Bld: 112 mg/dL — ABNORMAL HIGH (ref 65–99)
Potassium: 3.2 mmol/L — ABNORMAL LOW (ref 3.5–5.1)
SODIUM: 141 mmol/L (ref 135–145)
TOTAL PROTEIN: 7.6 g/dL (ref 6.5–8.1)

## 2016-03-05 LAB — RAPID URINE DRUG SCREEN, HOSP PERFORMED
Amphetamines: NOT DETECTED
BARBITURATES: NOT DETECTED
Benzodiazepines: NOT DETECTED
Cocaine: NOT DETECTED
OPIATES: NOT DETECTED
TETRAHYDROCANNABINOL: NOT DETECTED

## 2016-03-05 LAB — TROPONIN I: Troponin I: <0.03 ng/mL (ref <0.03)

## 2016-03-05 LAB — ACETAMINOPHEN LEVEL: Acetaminophen (Tylenol), Serum: <10 ug/mL — ABNORMAL LOW (ref 10–30)

## 2016-03-05 LAB — SALICYLATE LEVEL

## 2016-03-05 LAB — ETHANOL: Alcohol, Ethyl (B): <5 mg/dL (ref <5)

## 2016-03-05 MED ORDER — FOLIC ACID 1 MG PO TABS
1.0000 mg | ORAL_TABLET | Freq: Every day | ORAL | Status: DC
  Administered 2016-03-05 – 2016-03-06 (×2): 1 mg via ORAL
  Filled 2016-03-05 (×2): qty 1

## 2016-03-05 MED ORDER — METFORMIN HCL 500 MG PO TABS
500.0000 mg | ORAL_TABLET | Freq: Every day | ORAL | Status: DC
  Administered 2016-03-06: 500 mg via ORAL
  Filled 2016-03-05: qty 1

## 2016-03-05 MED ORDER — DULOXETINE HCL 30 MG PO CPEP: 30.0000 mg | ORAL_CAPSULE | Freq: Every day | ORAL | Status: DC

## 2016-03-05 MED ORDER — GABAPENTIN 100 MG PO CAPS
100.0000 mg | ORAL_CAPSULE | Freq: Three times a day (TID) | ORAL | Status: DC
  Administered 2016-03-05 – 2016-03-06 (×4): 100 mg via ORAL
  Filled 2016-03-05 (×4): qty 1

## 2016-03-05 MED ORDER — METOCLOPRAMIDE HCL 10 MG PO TABS: 10.0000 mg | ORAL_TABLET | Freq: Four times a day (QID) | ORAL | Status: DC | PRN

## 2016-03-05 MED ORDER — CLOPIDOGREL BISULFATE 75 MG PO TABS
75.0000 mg | ORAL_TABLET | Freq: Every day | ORAL | Status: DC
  Administered 2016-03-05 – 2016-03-06 (×2): 75 mg via ORAL
  Filled 2016-03-05 (×2): qty 1

## 2016-03-05 MED ORDER — ATORVASTATIN CALCIUM 40 MG PO TABS
40.0000 mg | ORAL_TABLET | Freq: Every day | ORAL | Status: DC
  Administered 2016-03-05: 40 mg via ORAL
  Filled 2016-03-05: qty 1

## 2016-03-05 MED ORDER — HYDROXYZINE HCL 25 MG PO TABS
50.0000 mg | ORAL_TABLET | Freq: Once | ORAL | Status: AC
  Administered 2016-03-05: 50 mg via ORAL
  Filled 2016-03-05: qty 2

## 2016-03-05 MED ORDER — ASPIRIN EC 81 MG PO TBEC
81.0000 mg | DELAYED_RELEASE_TABLET | Freq: Every day | ORAL | Status: DC
  Administered 2016-03-05 – 2016-03-06 (×2): 81 mg via ORAL
  Filled 2016-03-05 (×2): qty 1

## 2016-03-05 MED ORDER — RISPERIDONE 1 MG PO TABS
1.0000 mg | ORAL_TABLET | Freq: Two times a day (BID) | ORAL | Status: DC
  Administered 2016-03-05 – 2016-03-06 (×3): 1 mg via ORAL
  Filled 2016-03-05 (×3): qty 1

## 2016-03-05 MED ORDER — CEPHALEXIN 250 MG PO CAPS
500.0000 mg | ORAL_CAPSULE | Freq: Two times a day (BID) | ORAL | Status: DC
  Administered 2016-03-05 – 2016-03-06 (×3): 500 mg via ORAL
  Filled 2016-03-05 (×3): qty 2

## 2016-03-05 MED ORDER — DEXTROSE 5 % IV SOLN
1.0000 g | Freq: Once | INTRAVENOUS | Status: AC
  Administered 2016-03-05: 1 g via INTRAVENOUS
  Filled 2016-03-05: qty 10

## 2016-03-05 MED ORDER — DOCUSATE SODIUM 100 MG PO CAPS
100.0000 mg | ORAL_CAPSULE | Freq: Every day | ORAL | Status: DC
  Administered 2016-03-05 – 2016-03-06 (×2): 100 mg via ORAL
  Filled 2016-03-05 (×2): qty 1

## 2016-03-05 MED ORDER — CITALOPRAM HYDROBROMIDE 10 MG PO TABS
10.0000 mg | ORAL_TABLET | Freq: Every day | ORAL | Status: DC
  Administered 2016-03-05 – 2016-03-06 (×2): 10 mg via ORAL
  Filled 2016-03-05 (×2): qty 1

## 2016-03-05 MED ORDER — METFORMIN HCL 500 MG PO TABS: 500.0000 mg | ORAL_TABLET | Freq: Two times a day (BID) | ORAL | Status: DC

## 2016-03-05 MED ORDER — NITROFURANTOIN MONOHYD MACRO 100 MG PO CAPS: 100.0000 mg | ORAL_CAPSULE | Freq: Two times a day (BID) | ORAL | Status: DC

## 2016-03-05 MED ORDER — ACETAMINOPHEN 325 MG PO TABS
650.0000 mg | ORAL_TABLET | Freq: Four times a day (QID) | ORAL | Status: DC | PRN
  Administered 2016-03-05: 650 mg via ORAL
  Filled 2016-03-05: qty 2

## 2016-03-05 NOTE — ED Notes (Signed)
Pt on phone 

## 2016-03-05 NOTE — ED Triage Notes (Signed)
Pt also complaining of left sided chest pain. States hx of 3 MI's in past.

## 2016-03-05 NOTE — ED Notes (Signed)
Pt states that she still "see the little man, he's like an animal, he talks to me sometimes and he tells me to hurt myself, but I don't want to. I tell him I don't want to hurt myself. It does run through my mind every once in a while, to hurt myself.

## 2016-03-05 NOTE — ED Notes (Signed)
Meal tray delivered to pt
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
## 2016-03-05 NOTE — ED Notes (Signed)
Patient was given a snack and drink. 

## 2016-03-05 NOTE — ED Notes (Signed)
Visitor at bedside.

## 2016-03-05 NOTE — ED Notes (Signed)
Removed the IV FROM RIGHT HAND
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
## 2016-03-05 NOTE — ED Triage Notes (Signed)
Pt complaining of visual, auditory and tactile hallucinations. Pt states feels bugs crawling on skin, sees "an animal walking on two legs beside me" and is hearing voices telling her to kill herself. Pt tearful at triage. Pt states seen here last week for same. Pt states "I have pills I can take to kill myself."

## 2016-03-05 NOTE — ED Notes (Signed)
Pt belongings : Black pants Pink vest Red Sweatshirt jacket Tan ball cap Fruit print tank 2 pair socks 1 pair black crocs Black coat

## 2016-03-05 NOTE — ED Notes (Signed)
A regular diet was ordered for lunch.

## 2016-03-05 NOTE — ED Notes (Signed)
Pt wanded by security. 

## 2016-03-05 NOTE — ED Notes (Signed)
Pt returned to room  

## 2016-03-05 NOTE — ED Notes (Signed)
Pt on the phone, pt made aware that this is the last phone call for the day.

## 2016-03-06 DIAGNOSIS — F3340 Major depressive disorder, recurrent, in remission, unspecified: Secondary | ICD-10-CM | POA: Diagnosis not present

## 2016-03-06 DIAGNOSIS — F332 Major depressive disorder, recurrent severe without psychotic features: Secondary | ICD-10-CM

## 2016-03-06 DIAGNOSIS — Z7982 Long term (current) use of aspirin: Secondary | ICD-10-CM | POA: Diagnosis not present

## 2016-03-06 DIAGNOSIS — I5032 Chronic diastolic (congestive) heart failure: Secondary | ICD-10-CM | POA: Diagnosis present

## 2016-03-06 DIAGNOSIS — I5030 Unspecified diastolic (congestive) heart failure: Secondary | ICD-10-CM | POA: Diagnosis not present

## 2016-03-06 DIAGNOSIS — Z825 Family history of asthma and other chronic lower respiratory diseases: Secondary | ICD-10-CM | POA: Diagnosis not present

## 2016-03-06 DIAGNOSIS — N390 Urinary tract infection, site not specified: Secondary | ICD-10-CM | POA: Diagnosis present

## 2016-03-06 DIAGNOSIS — Z9889 Other specified postprocedural states: Secondary | ICD-10-CM

## 2016-03-06 DIAGNOSIS — Z79899 Other long term (current) drug therapy: Secondary | ICD-10-CM

## 2016-03-06 DIAGNOSIS — F419 Anxiety disorder, unspecified: Secondary | ICD-10-CM | POA: Diagnosis present

## 2016-03-06 DIAGNOSIS — Z951 Presence of aortocoronary bypass graft: Secondary | ICD-10-CM | POA: Diagnosis not present

## 2016-03-06 DIAGNOSIS — Z801 Family history of malignant neoplasm of trachea, bronchus and lung: Secondary | ICD-10-CM

## 2016-03-06 DIAGNOSIS — I252 Old myocardial infarction: Secondary | ICD-10-CM | POA: Diagnosis not present

## 2016-03-06 DIAGNOSIS — Z91018 Allergy to other foods: Secondary | ICD-10-CM

## 2016-03-06 DIAGNOSIS — Z880 Allergy status to penicillin: Secondary | ICD-10-CM

## 2016-03-06 DIAGNOSIS — Z8249 Family history of ischemic heart disease and other diseases of the circulatory system: Secondary | ICD-10-CM | POA: Diagnosis not present

## 2016-03-06 DIAGNOSIS — L80 Vitiligo: Secondary | ICD-10-CM | POA: Diagnosis present

## 2016-03-06 DIAGNOSIS — R45850 Homicidal ideations: Secondary | ICD-10-CM

## 2016-03-06 DIAGNOSIS — G4700 Insomnia, unspecified: Secondary | ICD-10-CM | POA: Diagnosis present

## 2016-03-06 DIAGNOSIS — Z882 Allergy status to sulfonamides status: Secondary | ICD-10-CM | POA: Diagnosis not present

## 2016-03-06 DIAGNOSIS — R45851 Suicidal ideations: Secondary | ICD-10-CM | POA: Diagnosis present

## 2016-03-06 DIAGNOSIS — E785 Hyperlipidemia, unspecified: Secondary | ICD-10-CM | POA: Diagnosis present

## 2016-03-06 DIAGNOSIS — R443 Hallucinations, unspecified: Secondary | ICD-10-CM

## 2016-03-06 DIAGNOSIS — Z833 Family history of diabetes mellitus: Secondary | ICD-10-CM

## 2016-03-06 DIAGNOSIS — F333 Major depressive disorder, recurrent, severe with psychotic symptoms: Secondary | ICD-10-CM | POA: Diagnosis not present

## 2016-03-06 DIAGNOSIS — R51 Headache: Secondary | ICD-10-CM | POA: Diagnosis not present

## 2016-03-06 DIAGNOSIS — E1149 Type 2 diabetes mellitus with other diabetic neurological complication: Secondary | ICD-10-CM | POA: Diagnosis not present

## 2016-03-06 DIAGNOSIS — E1143 Type 2 diabetes mellitus with diabetic autonomic (poly)neuropathy: Secondary | ICD-10-CM | POA: Diagnosis present

## 2016-03-06 DIAGNOSIS — K3184 Gastroparesis: Secondary | ICD-10-CM | POA: Diagnosis present

## 2016-03-06 DIAGNOSIS — K649 Unspecified hemorrhoids: Secondary | ICD-10-CM | POA: Diagnosis present

## 2016-03-06 DIAGNOSIS — I110 Hypertensive heart disease with heart failure: Secondary | ICD-10-CM | POA: Diagnosis not present

## 2016-03-06 DIAGNOSIS — Z8673 Personal history of transient ischemic attack (TIA), and cerebral infarction without residual deficits: Secondary | ICD-10-CM | POA: Diagnosis not present

## 2016-03-06 DIAGNOSIS — Z9114 Patient's other noncompliance with medication regimen: Secondary | ICD-10-CM | POA: Diagnosis not present

## 2016-03-06 DIAGNOSIS — I2510 Atherosclerotic heart disease of native coronary artery without angina pectoris: Secondary | ICD-10-CM | POA: Diagnosis not present

## 2016-03-06 NOTE — Progress Notes (Signed)
[facility] [place] with intake) called stating Dr. Lucilla Edin has accepted pt for [facility] to [facility] on voluntary basis. Pt can be transported anytime and report # is [phone]. Notified [facility]. Notified [facility]) also as they were awaiting to hear pt's IVC status.   Sharren Bridge, MSW, LCSW Clinical Social Work, Disposition  03/06/2016 [phone]

## 2016-03-06 NOTE — Progress Notes (Signed)
Received call from [place] at [facility]. She states admitting MD would like to offer pt bed if pt could be IVC'd. States, "Due to her being suicidal with a plan and experiencing auditory/visual command hallucinations, she feels it would be unsafe to transfer pt and expect consent for treatment." Spoke with EDP who evaluated pt and does not observe criteria for IVC, "does not exhibit delusions or SI."  Pt to be reassessed via telepsych for continued monitoring and disposition recommendation given above information. Will continue following.   Sharren Bridge, MSW, LCSW Clinical Social Work, Disposition  03/06/2016 [phone]

## 2016-03-06 NOTE — BHH Counselor (Signed)
Pt admission information packet sent to the following facilities for review for IP Geriatric admission:  [place], [place], [facility], [place] Triage Specialist [facility]

## 2016-03-06 NOTE — ED Notes (Signed)
Regular Diet ordered for Lunch. 

## 2016-03-06 NOTE — ED Notes (Signed)
Pt verbalizes understanding of going to [facility] and agrees. This Probation officer wrote down brothers phone number for pt to have.

## 2016-03-06 NOTE — ED Notes (Signed)
Snacks given 
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
## 2016-03-06 NOTE — ED Notes (Signed)
Pt ambulated to bathroom, breakfast meal given.

## 2016-03-06 NOTE — ED Notes (Signed)
Regular diet for breakfast was ordered.

## 2016-03-07 LAB — URINE CULTURE

## 2016-03-08 ENCOUNTER — Telehealth (HOSPITAL_BASED_OUTPATIENT_CLINIC_OR_DEPARTMENT_OTHER): Payer: Self-pay

## 2016-03-08 NOTE — Telephone Encounter (Signed)
Post ED Visit - Positive Culture Follow-up  Culture report reviewed by antimicrobial stewardship pharmacist:  []  Elenor Quinones, Pharm.D. []  Heide Guile, Pharm.D., BCPS []  Parks Neptune, Pharm.D. []  Alycia Rossetti, Pharm.D., BCPS []  [place], Pharm.D., BCPS, AAHIVP []  Legrand Como, Pharm.D., BCPS, AAHIVP []  Milus Glazier, Pharm.D. []  Stephens November, Pharm.DAbram Sander, Pharm.D.  Positive urine culture, >/= 100,000 colonies -> Klebsiella pneumoniae Transferred to [facility] for psych eval. Was on Keflex prior to transfer   Dortha Kern 03/08/2016, 5:19 PM

## 2016-03-28 ENCOUNTER — Ambulatory Visit: Payer: Self-pay | Admitting: Family Medicine

## 2016-04-01 ENCOUNTER — Ambulatory Visit (INDEPENDENT_AMBULATORY_CARE_PROVIDER_SITE_OTHER): Payer: Medicare Other | Admitting: Family Medicine

## 2016-04-01 ENCOUNTER — Encounter: Payer: Self-pay | Admitting: Family Medicine

## 2016-04-01 VITALS — BP 130/78 | HR 104 | Temp 97.4°F | Wt 138.0 lb

## 2016-04-01 DIAGNOSIS — E1165 Type 2 diabetes mellitus with hyperglycemia: Secondary | ICD-10-CM

## 2016-04-01 DIAGNOSIS — E7800 Pure hypercholesterolemia, unspecified: Secondary | ICD-10-CM

## 2016-04-01 DIAGNOSIS — IMO0002 Reserved for concepts with insufficient information to code with codable children: Secondary | ICD-10-CM

## 2016-04-01 DIAGNOSIS — I5032 Chronic diastolic (congestive) heart failure: Secondary | ICD-10-CM

## 2016-04-01 DIAGNOSIS — Z951 Presence of aortocoronary bypass graft: Secondary | ICD-10-CM | POA: Diagnosis not present

## 2016-04-01 DIAGNOSIS — E538 Deficiency of other specified B group vitamins: Secondary | ICD-10-CM

## 2016-04-01 DIAGNOSIS — E1140 Type 2 diabetes mellitus with diabetic neuropathy, unspecified: Secondary | ICD-10-CM

## 2016-04-01 DIAGNOSIS — F333 Major depressive disorder, recurrent, severe with psychotic symptoms: Secondary | ICD-10-CM

## 2016-04-01 DIAGNOSIS — R110 Nausea: Secondary | ICD-10-CM

## 2016-04-01 MED ORDER — CYANOCOBALAMIN 1000 MCG/ML IJ SOLN
1000.0000 ug | Freq: Once | INTRAMUSCULAR | Status: AC
  Administered 2016-04-01: 1000 ug via INTRAMUSCULAR

## 2016-04-01 NOTE — Assessment & Plan Note (Signed)
Encouraged she restart metformin. Consider A1c next visit Lab Results  Component Value Date   HGBA1C 7.4 (H) 11/10/2015
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
## 2016-04-01 NOTE — Assessment & Plan Note (Signed)
Advised restart aspirin, plavix, lipitor

## 2016-04-01 NOTE — Progress Notes (Signed)
Pre visit review using our clinic review tool, if applicable. No additional management support is needed unless otherwise documented below in the visit note. 

## 2016-04-01 NOTE — Patient Instructions (Addendum)
B12 shot today Stop risperdal. Geodon takes it's place.  Stop metoclopramide, tramadol. Restart all other medicines.  May continue gabapentin 3 times a day as needed.  Return in 2-3 weeks for follow up visit.

## 2016-04-01 NOTE — Assessment & Plan Note (Signed)
b12 shot today and rec monthly.

## 2016-04-01 NOTE — Assessment & Plan Note (Signed)
Lasix was discontinued at recent hospitalization - watch pedal edema.

## 2016-04-01 NOTE — Assessment & Plan Note (Signed)
Encouraged she restart lipitor 40mg  daily

## 2016-04-01 NOTE — Assessment & Plan Note (Signed)
S/p recent hospitalization, doing better when she was taking medications as prescribed in hospital, she has since stopped all meds. Reviewed importance of compliance with psychotropic medications. Pt and partner agree to restart medications - Maria Fox prepares weekly pill box for patient.  Discussed possible psych referral for severe MDD, however I feel she has not had a fair trial of taking antidepressants as prescribed.  Encouraged counseling.  RTC 2-3 wks close f/u.

## 2016-04-01 NOTE — Assessment & Plan Note (Signed)
Treating with zofran PRN

## 2016-04-21 ENCOUNTER — Encounter (HOSPITAL_COMMUNITY): Payer: Self-pay | Admitting: *Deleted

## 2016-04-21 ENCOUNTER — Emergency Department (HOSPITAL_COMMUNITY)
Admission: EM | Admit: 2016-04-21 | Discharge: 2016-04-23 | Disposition: A | Discharge status: Other Acute Inpatient Hospital | Payer: Medicare Other | Attending: Emergency Medicine | Admitting: Emergency Medicine

## 2016-04-21 DIAGNOSIS — I2510 Atherosclerotic heart disease of native coronary artery without angina pectoris: Secondary | ICD-10-CM | POA: Diagnosis not present

## 2016-04-21 DIAGNOSIS — I5030 Unspecified diastolic (congestive) heart failure: Secondary | ICD-10-CM | POA: Insufficient documentation

## 2016-04-21 DIAGNOSIS — I252 Old myocardial infarction: Secondary | ICD-10-CM | POA: Insufficient documentation

## 2016-04-21 DIAGNOSIS — Z7984 Long term (current) use of oral hypoglycemic drugs: Secondary | ICD-10-CM | POA: Diagnosis not present

## 2016-04-21 DIAGNOSIS — I110 Hypertensive heart disease with heart failure: Secondary | ICD-10-CM | POA: Insufficient documentation

## 2016-04-21 DIAGNOSIS — R45851 Suicidal ideations: Secondary | ICD-10-CM

## 2016-04-21 DIAGNOSIS — Z951 Presence of aortocoronary bypass graft: Secondary | ICD-10-CM | POA: Insufficient documentation

## 2016-04-21 DIAGNOSIS — Z7982 Long term (current) use of aspirin: Secondary | ICD-10-CM | POA: Insufficient documentation

## 2016-04-21 DIAGNOSIS — F339 Major depressive disorder, recurrent, unspecified: Secondary | ICD-10-CM | POA: Insufficient documentation

## 2016-04-21 DIAGNOSIS — E119 Type 2 diabetes mellitus without complications: Secondary | ICD-10-CM | POA: Diagnosis not present

## 2016-04-21 DIAGNOSIS — Z7902 Long term (current) use of antithrombotics/antiplatelets: Secondary | ICD-10-CM | POA: Insufficient documentation

## 2016-04-21 DIAGNOSIS — Z79899 Other long term (current) drug therapy: Secondary | ICD-10-CM | POA: Diagnosis not present

## 2016-04-21 LAB — CBC
HCT: 39.4 % (ref 36.0–46.0)
Hemoglobin: 13.9 g/dL (ref 12.0–15.0)
MCH: 30.5 pg (ref 26.0–34.0)
MCHC: 35.3 g/dL (ref 30.0–36.0)
MCV: 86.4 fL (ref 78.0–100.0)
PLATELETS: 319 10*3/uL (ref 150–400)
RBC: 4.56 MIL/uL (ref 3.87–5.11)
RDW: 13.8 % (ref 11.5–15.5)
WBC: 11 10*3/uL — AB (ref 4.0–10.5)

## 2016-04-21 LAB — COMPREHENSIVE METABOLIC PANEL
ALT: 11 U/L — ABNORMAL LOW (ref 14–54)
ANION GAP: 15 (ref 5–15)
AST: 22 U/L (ref 15–41)
Albumin: 4.1 g/dL (ref 3.5–5.0)
Alkaline Phosphatase: 79 U/L (ref 38–126)
BILIRUBIN TOTAL: 0.7 mg/dL (ref 0.3–1.2)
BUN: 15 mg/dL (ref 6–20)
CO2: 20 mmol/L — ABNORMAL LOW (ref 22–32)
Calcium: 9.4 mg/dL (ref 8.9–10.3)
Chloride: 99 mmol/L — ABNORMAL LOW (ref 101–111)
Creatinine, Ser: 1.33 mg/dL — ABNORMAL HIGH (ref 0.44–1.00)
GFR calc Af Amer: 45 mL/min — ABNORMAL LOW (ref >60)
GFR, EST NON AFRICAN AMERICAN: 39 mL/min — AB (ref >60)
Glucose, Bld: 191 mg/dL — ABNORMAL HIGH (ref 65–99)
POTASSIUM: 3 mmol/L — AB (ref 3.5–5.1)
Sodium: 134 mmol/L — ABNORMAL LOW (ref 135–145)
TOTAL PROTEIN: 7.3 g/dL (ref 6.5–8.1)

## 2016-04-21 LAB — URINALYSIS, ROUTINE W REFLEX MICROSCOPIC
BILIRUBIN URINE: NEGATIVE
Bacteria, UA: NONE SEEN
Glucose, UA: >=500 mg/dL — AB
HGB URINE DIPSTICK: NEGATIVE
KETONES UR: NEGATIVE mg/dL
Nitrite: NEGATIVE
PH: 5 (ref 5.0–8.0)
Protein, ur: NEGATIVE mg/dL
Specific Gravity, Urine: 1.022 (ref 1.005–1.030)

## 2016-04-21 LAB — RAPID URINE DRUG SCREEN, HOSP PERFORMED
Amphetamines: NOT DETECTED
BENZODIAZEPINES: NOT DETECTED
Barbiturates: NOT DETECTED
COCAINE: NOT DETECTED
Opiates: NOT DETECTED
Tetrahydrocannabinol: NOT DETECTED

## 2016-04-21 LAB — ACETAMINOPHEN LEVEL

## 2016-04-21 LAB — SALICYLATE LEVEL: Salicylate Lvl: <7 mg/dL (ref 2.8–30.0)

## 2016-04-21 LAB — ETHANOL

## 2016-04-21 MED ORDER — IBUPROFEN 400 MG PO TABS: 600.0000 mg | ORAL_TABLET | Freq: Three times a day (TID) | ORAL | Status: DC | PRN

## 2016-04-21 MED ORDER — ACETAMINOPHEN 325 MG PO TABS
650.0000 mg | ORAL_TABLET | ORAL | Status: DC | PRN
  Administered 2016-04-22 – 2016-04-23 (×4): 650 mg via ORAL
  Filled 2016-04-21 (×5): qty 2

## 2016-04-21 MED ORDER — LORAZEPAM 0.5 MG PO TABS
0.5000 mg | ORAL_TABLET | Freq: Three times a day (TID) | ORAL | Status: DC | PRN
  Administered 2016-04-23: 0.5 mg via ORAL
  Filled 2016-04-21: qty 1

## 2016-04-21 MED ORDER — NICOTINE 21 MG/24HR TD PT24: 21.0000 mg | MEDICATED_PATCH | Freq: Every day | TRANSDERMAL | Status: DC

## 2016-04-21 MED ORDER — ALUM & MAG HYDROXIDE-SIMETH 200-200-20 MG/5ML PO SUSP: 30.0000 mL | ORAL | Status: DC | PRN

## 2016-04-21 MED ORDER — ZOLPIDEM TARTRATE 5 MG PO TABS
5.0000 mg | ORAL_TABLET | Freq: Every evening | ORAL | Status: DC | PRN
  Administered 2016-04-22: 5 mg via ORAL
  Filled 2016-04-21: qty 1

## 2016-04-21 MED ORDER — ONDANSETRON HCL 4 MG PO TABS
4.0000 mg | ORAL_TABLET | Freq: Three times a day (TID) | ORAL | Status: DC | PRN
  Administered 2016-04-22 – 2016-04-23 (×2): 4 mg via ORAL
  Filled 2016-04-21 (×2): qty 1
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
## 2016-04-21 NOTE — ED Triage Notes (Signed)
Pt states that she has "mental issues" and states that she feels suicidal.  Pt is crying but denies any specific plan.  Pt reports auditory hallucinations.  Pt has self care deficit and reports that she has soiled herself and "can not do anything about it, I can't function".  Pt is crying in triage.

## 2016-04-22 MED ORDER — GABAPENTIN 100 MG PO CAPS
100.0000 mg | ORAL_CAPSULE | Freq: Three times a day (TID) | ORAL | Status: DC
  Administered 2016-04-22 – 2016-04-23 (×3): 100 mg via ORAL
  Filled 2016-04-22 (×3): qty 1

## 2016-04-22 MED ORDER — DOCUSATE SODIUM 100 MG PO CAPS: 100.0000 mg | ORAL_CAPSULE | Freq: Every day | ORAL | Status: DC | PRN

## 2016-04-22 MED ORDER — METFORMIN HCL 500 MG PO TABS
1000.0000 mg | ORAL_TABLET | Freq: Two times a day (BID) | ORAL | Status: DC
  Administered 2016-04-23 (×2): 1000 mg via ORAL
  Filled 2016-04-22 (×2): qty 2

## 2016-04-22 MED ORDER — ASPIRIN EC 81 MG PO TBEC
81.0000 mg | DELAYED_RELEASE_TABLET | Freq: Every day | ORAL | Status: DC
  Administered 2016-04-23: 81 mg via ORAL
  Filled 2016-04-22: qty 1

## 2016-04-22 NOTE — ED Notes (Signed)
Patient eating lunch.

## 2016-04-22 NOTE — Progress Notes (Signed)
04/22/16  CSW referred patient to:Boykin Nearing, [employer], [place], [facility], [place], [place].  Areatha Keas. Judi Cong, MSW, [place] Work Disposition [phone]

## 2016-04-22 NOTE — ED Notes (Signed)
Pt c/o nausea. Pt given saltine crackers and will give ordered PRN 4mg  zofran.

## 2016-04-22 NOTE — ED Notes (Signed)
Pt's husband called requesting update on pt plan of care. Pt gave this nurse permission to speak to husband over the phone. Pt's husband left his phone number in the event he needs to be reached:  Leroy Kennedy: [phone]

## 2016-04-22 NOTE — ED Notes (Signed)
No sitter at bedside. Staffing and charge RN aware. No one available to sit at this time. Pt visible from nursing station, will continue hourly rounds.

## 2016-04-22 NOTE — ED Notes (Signed)
Dinner order placed by Levada Dy, NS/MT.

## 2016-04-23 ENCOUNTER — Encounter (HOSPITAL_COMMUNITY): Payer: Self-pay | Admitting: Emergency Medicine

## 2016-04-23 ENCOUNTER — Ambulatory Visit: Payer: Self-pay | Admitting: Family Medicine

## 2016-04-23 DIAGNOSIS — G43909 Migraine, unspecified, not intractable, without status migrainosus: Secondary | ICD-10-CM | POA: Diagnosis present

## 2016-04-23 DIAGNOSIS — E785 Hyperlipidemia, unspecified: Secondary | ICD-10-CM | POA: Diagnosis not present

## 2016-04-23 DIAGNOSIS — I252 Old myocardial infarction: Secondary | ICD-10-CM | POA: Diagnosis not present

## 2016-04-23 DIAGNOSIS — I25810 Atherosclerosis of coronary artery bypass graft(s) without angina pectoris: Secondary | ICD-10-CM | POA: Diagnosis not present

## 2016-04-23 DIAGNOSIS — F339 Major depressive disorder, recurrent, unspecified: Secondary | ICD-10-CM | POA: Diagnosis not present

## 2016-04-23 DIAGNOSIS — I5030 Unspecified diastolic (congestive) heart failure: Secondary | ICD-10-CM | POA: Diagnosis not present

## 2016-04-23 DIAGNOSIS — I249 Acute ischemic heart disease, unspecified: Secondary | ICD-10-CM | POA: Diagnosis not present

## 2016-04-23 DIAGNOSIS — Z880 Allergy status to penicillin: Secondary | ICD-10-CM | POA: Diagnosis not present

## 2016-04-23 DIAGNOSIS — Z882 Allergy status to sulfonamides status: Secondary | ICD-10-CM | POA: Diagnosis not present

## 2016-04-23 DIAGNOSIS — R45851 Suicidal ideations: Secondary | ICD-10-CM | POA: Diagnosis not present

## 2016-04-23 DIAGNOSIS — Z79899 Other long term (current) drug therapy: Secondary | ICD-10-CM | POA: Diagnosis not present

## 2016-04-23 DIAGNOSIS — I2510 Atherosclerotic heart disease of native coronary artery without angina pectoris: Secondary | ICD-10-CM | POA: Diagnosis not present

## 2016-04-23 DIAGNOSIS — I10 Essential (primary) hypertension: Secondary | ICD-10-CM | POA: Diagnosis not present

## 2016-04-23 DIAGNOSIS — Z885 Allergy status to narcotic agent status: Secondary | ICD-10-CM | POA: Diagnosis not present

## 2016-04-23 DIAGNOSIS — I110 Hypertensive heart disease with heart failure: Secondary | ICD-10-CM | POA: Diagnosis not present

## 2016-04-23 DIAGNOSIS — F419 Anxiety disorder, unspecified: Secondary | ICD-10-CM | POA: Diagnosis present

## 2016-04-23 DIAGNOSIS — Z8673 Personal history of transient ischemic attack (TIA), and cerebral infarction without residual deficits: Secondary | ICD-10-CM | POA: Diagnosis not present

## 2016-04-23 DIAGNOSIS — F323 Major depressive disorder, single episode, severe with psychotic features: Secondary | ICD-10-CM | POA: Diagnosis not present

## 2016-04-23 DIAGNOSIS — F333 Major depressive disorder, recurrent, severe with psychotic symptoms: Secondary | ICD-10-CM | POA: Diagnosis not present

## 2016-04-23 DIAGNOSIS — E119 Type 2 diabetes mellitus without complications: Secondary | ICD-10-CM | POA: Diagnosis not present

## 2016-04-23 DIAGNOSIS — R0789 Other chest pain: Secondary | ICD-10-CM | POA: Diagnosis not present

## 2016-04-23 DIAGNOSIS — R079 Chest pain, unspecified: Secondary | ICD-10-CM | POA: Diagnosis not present

## 2016-04-23 DIAGNOSIS — R110 Nausea: Secondary | ICD-10-CM | POA: Diagnosis not present

## 2016-04-23 DIAGNOSIS — G4700 Insomnia, unspecified: Secondary | ICD-10-CM | POA: Diagnosis present

## 2016-04-23 DIAGNOSIS — Z951 Presence of aortocoronary bypass graft: Secondary | ICD-10-CM | POA: Diagnosis not present

## 2016-04-23 DIAGNOSIS — A0472 Enterocolitis due to Clostridium difficile, not specified as recurrent: Secondary | ICD-10-CM | POA: Diagnosis not present

## 2016-04-23 DIAGNOSIS — M7989 Other specified soft tissue disorders: Secondary | ICD-10-CM | POA: Diagnosis not present

## 2016-04-23 MED ORDER — FOLIC ACID 1 MG PO TABS
1.0000 mg | ORAL_TABLET | Freq: Every day | ORAL | Status: DC
  Administered 2016-04-23: 1 mg via ORAL
  Filled 2016-04-23: qty 1

## 2016-04-23 MED ORDER — ATORVASTATIN CALCIUM 40 MG PO TABS: 40.0000 mg | ORAL_TABLET | Freq: Every day | ORAL | Status: DC

## 2016-04-23 MED ORDER — CLOPIDOGREL BISULFATE 75 MG PO TABS
75.0000 mg | ORAL_TABLET | Freq: Every day | ORAL | Status: DC
  Administered 2016-04-23: 75 mg via ORAL
  Filled 2016-04-23: qty 1

## 2016-04-23 MED ORDER — DULOXETINE HCL 60 MG PO CPEP
60.0000 mg | ORAL_CAPSULE | Freq: Every day | ORAL | Status: DC
  Administered 2016-04-23: 60 mg via ORAL
  Filled 2016-04-23: qty 1

## 2016-04-23 MED ORDER — METOCLOPRAMIDE HCL 10 MG PO TABS: 10.0000 mg | ORAL_TABLET | Freq: Four times a day (QID) | ORAL | Status: DC | PRN

## 2016-04-23 MED ORDER — TRAZODONE HCL 50 MG PO TABS: 50.0000 mg | ORAL_TABLET | Freq: Every evening | ORAL | Status: DC | PRN

## 2016-04-23 MED ORDER — ZIPRASIDONE HCL 20 MG PO CAPS: 80.0000 mg | ORAL_CAPSULE | Freq: Every day | ORAL | Status: DC

## 2016-04-23 NOTE — ED Provider Notes (Signed)
Vitals stable.  Ready for transport   Dorie Rank, MD 04/23/16 1840

## 2016-04-23 NOTE — ED Notes (Signed)
Pt has received one phone call.

## 2016-04-23 NOTE — ED Notes (Signed)
Pt AMBULATORY to F9. Pt's belongings placed in lower cabinet in room. Pt alert, oriented, cooperative.

## 2016-04-23 NOTE — ED Notes (Signed)
Patient was given milk during snack time, and a regular dinner has been ordered.

## 2016-04-23 NOTE — Progress Notes (Signed)
04/23/16 [facility] [phone]) is accepting pt. today for placement. Pt. may arrive anytime Accepting physician is Dr. Karrie Doffing.  Call report to [phone].    CSW notified [facility] PEDS [facility].  Areatha Keas. Judi Cong, MSW, [place] Work Disposition [phone]

## 2016-04-23 NOTE — ED Notes (Signed)
Pt on phone at nurses' desk on phone.

## 2016-04-23 NOTE — ED Notes (Signed)
Pt aware of tx plan - accepted to [facility]. Pt in agreement. Pt given Ativan as requested for anxiety.

## 2016-04-23 NOTE — ED Notes (Signed)
Pt called her brother - Nathaneil Canary and boyfriend - left messages advising of acceptance.

## 2016-04-23 NOTE — ED Notes (Signed)
Patient was given a snack and Drink, and a Regular diet was ordered for Lunch.

## 2016-04-23 NOTE — Progress Notes (Signed)
Followed up on inpatient referral efforts: [facility]- fax referral per [facility]- Did not receive referral last night, advised re-fax [place] as above, per [employer]- fax for review for waiting list per [facility]- left voicemail for intake to see if referral was received/reviewed last night and to check bed availability today  [facility] ([facility]) and [facility] called back stating referral is being reviewed.  Sharren Bridge, MSW, LCSW Clinical Social Work, Disposition  04/23/2016 [phone]

## 2016-04-25 DIAGNOSIS — Z951 Presence of aortocoronary bypass graft: Secondary | ICD-10-CM | POA: Diagnosis not present

## 2016-04-25 DIAGNOSIS — I249 Acute ischemic heart disease, unspecified: Secondary | ICD-10-CM | POA: Diagnosis not present

## 2016-04-25 DIAGNOSIS — R079 Chest pain, unspecified: Secondary | ICD-10-CM | POA: Diagnosis not present

## 2016-05-17 ENCOUNTER — Ambulatory Visit: Payer: Medicare Other | Admitting: Primary Care

## 2016-05-27 ENCOUNTER — Ambulatory Visit: Payer: Self-pay | Admitting: Physician Assistant

## 2016-05-27 ENCOUNTER — Emergency Department (HOSPITAL_COMMUNITY): Payer: Medicare Other

## 2016-05-27 ENCOUNTER — Encounter (HOSPITAL_COMMUNITY): Payer: Self-pay | Admitting: Family Medicine

## 2016-05-27 ENCOUNTER — Emergency Department (HOSPITAL_COMMUNITY)
Admission: EM | Admit: 2016-05-27 | Discharge: 2016-05-28 | Disposition: A | Discharge status: Home / Self Care | Payer: Medicare Other | Attending: Emergency Medicine | Admitting: Emergency Medicine

## 2016-05-27 DIAGNOSIS — I2510 Atherosclerotic heart disease of native coronary artery without angina pectoris: Secondary | ICD-10-CM | POA: Diagnosis not present

## 2016-05-27 DIAGNOSIS — E1149 Type 2 diabetes mellitus with other diabetic neurological complication: Secondary | ICD-10-CM | POA: Diagnosis not present

## 2016-05-27 DIAGNOSIS — Z79899 Other long term (current) drug therapy: Secondary | ICD-10-CM | POA: Diagnosis not present

## 2016-05-27 DIAGNOSIS — R441 Visual hallucinations: Secondary | ICD-10-CM | POA: Insufficient documentation

## 2016-05-27 DIAGNOSIS — Z8673 Personal history of transient ischemic attack (TIA), and cerebral infarction without residual deficits: Secondary | ICD-10-CM | POA: Insufficient documentation

## 2016-05-27 DIAGNOSIS — I509 Heart failure, unspecified: Secondary | ICD-10-CM | POA: Insufficient documentation

## 2016-05-27 DIAGNOSIS — R440 Auditory hallucinations: Secondary | ICD-10-CM | POA: Diagnosis not present

## 2016-05-27 DIAGNOSIS — R0602 Shortness of breath: Secondary | ICD-10-CM | POA: Diagnosis not present

## 2016-05-27 DIAGNOSIS — R45851 Suicidal ideations: Secondary | ICD-10-CM | POA: Diagnosis not present

## 2016-05-27 DIAGNOSIS — Z951 Presence of aortocoronary bypass graft: Secondary | ICD-10-CM | POA: Diagnosis not present

## 2016-05-27 DIAGNOSIS — R079 Chest pain, unspecified: Secondary | ICD-10-CM

## 2016-05-27 DIAGNOSIS — I252 Old myocardial infarction: Secondary | ICD-10-CM | POA: Insufficient documentation

## 2016-05-27 DIAGNOSIS — I110 Hypertensive heart disease with heart failure: Secondary | ICD-10-CM | POA: Insufficient documentation

## 2016-05-27 DIAGNOSIS — R443 Hallucinations, unspecified: Secondary | ICD-10-CM | POA: Diagnosis not present

## 2016-05-27 DIAGNOSIS — R0789 Other chest pain: Secondary | ICD-10-CM | POA: Insufficient documentation

## 2016-05-27 LAB — RAPID URINE DRUG SCREEN, HOSP PERFORMED
Amphetamines: NOT DETECTED
BENZODIAZEPINES: NOT DETECTED
Barbiturates: NOT DETECTED
COCAINE: NOT DETECTED
Opiates: NOT DETECTED
Tetrahydrocannabinol: NOT DETECTED

## 2016-05-27 LAB — CBC
HCT: 36.9 % (ref 36.0–46.0)
Hemoglobin: 12.4 g/dL (ref 12.0–15.0)
MCH: 30.3 pg (ref 26.0–34.0)
MCHC: 33.6 g/dL (ref 30.0–36.0)
MCV: 90.2 fL (ref 78.0–100.0)
PLATELETS: 228 10*3/uL (ref 150–400)
RBC: 4.09 MIL/uL (ref 3.87–5.11)
RDW: 14.7 % (ref 11.5–15.5)
WBC: 9 10*3/uL (ref 4.0–10.5)

## 2016-05-27 LAB — I-STAT TROPONIN, ED: TROPONIN I, POC: 0.01 ng/mL (ref 0.00–0.08)

## 2016-05-27 LAB — COMPREHENSIVE METABOLIC PANEL
ALT: 15 U/L (ref 14–54)
AST: 18 U/L (ref 15–41)
Albumin: 3.7 g/dL (ref 3.5–5.0)
Alkaline Phosphatase: 71 U/L (ref 38–126)
Anion gap: 12 (ref 5–15)
BUN: 12 mg/dL (ref 6–20)
CO2: 22 mmol/L (ref 22–32)
Calcium: 9.3 mg/dL (ref 8.9–10.3)
Chloride: 104 mmol/L (ref 101–111)
Creatinine, Ser: 1.25 mg/dL — ABNORMAL HIGH (ref 0.44–1.00)
GFR, EST AFRICAN AMERICAN: 48 mL/min — AB (ref >60)
GFR, EST NON AFRICAN AMERICAN: 42 mL/min — AB (ref >60)
Glucose, Bld: 190 mg/dL — ABNORMAL HIGH (ref 65–99)
Potassium: 4.5 mmol/L (ref 3.5–5.1)
Sodium: 138 mmol/L (ref 135–145)
Total Bilirubin: 0.8 mg/dL (ref 0.3–1.2)
Total Protein: 6.8 g/dL (ref 6.5–8.1)

## 2016-05-27 LAB — ETHANOL

## 2016-05-27 LAB — SALICYLATE LEVEL

## 2016-05-27 LAB — ACETAMINOPHEN LEVEL: Acetaminophen (Tylenol), Serum: <10 ug/mL — ABNORMAL LOW (ref 10–30)

## 2016-05-27 MED ORDER — ASPIRIN EC 81 MG PO TBEC
81.0000 mg | DELAYED_RELEASE_TABLET | Freq: Every day | ORAL | Status: DC
  Administered 2016-05-28: 81 mg via ORAL
  Filled 2016-05-27: qty 1

## 2016-05-27 MED ORDER — ONDANSETRON HCL 4 MG PO TABS: 4.0000 mg | ORAL_TABLET | Freq: Three times a day (TID) | ORAL | Status: DC | PRN

## 2016-05-27 MED ORDER — METOCLOPRAMIDE HCL 10 MG PO TABS: 10.0000 mg | ORAL_TABLET | Freq: Four times a day (QID) | ORAL | Status: DC | PRN

## 2016-05-27 MED ORDER — ZIPRASIDONE HCL 20 MG PO CAPS
80.0000 mg | ORAL_CAPSULE | Freq: Every day | ORAL | Status: DC
  Filled 2016-05-27: qty 1

## 2016-05-27 MED ORDER — DOCUSATE SODIUM 100 MG PO CAPS
100.0000 mg | ORAL_CAPSULE | Freq: Two times a day (BID) | ORAL | Status: DC
  Administered 2016-05-27: 100 mg via ORAL
  Filled 2016-05-27: qty 1

## 2016-05-27 MED ORDER — LORAZEPAM 1 MG PO TABS: 1.0000 mg | ORAL_TABLET | Freq: Three times a day (TID) | ORAL | Status: DC | PRN

## 2016-05-27 MED ORDER — ACETAMINOPHEN 325 MG PO TABS
650.0000 mg | ORAL_TABLET | ORAL | Status: DC | PRN
  Administered 2016-05-27: 650 mg via ORAL
  Filled 2016-05-27: qty 2

## 2016-05-27 MED ORDER — DULOXETINE HCL 60 MG PO CPEP
60.0000 mg | ORAL_CAPSULE | Freq: Every day | ORAL | Status: DC
  Administered 2016-05-28: 60 mg via ORAL
  Filled 2016-05-27 (×2): qty 1

## 2016-05-27 MED ORDER — FOLIC ACID 1 MG PO TABS
1.0000 mg | ORAL_TABLET | Freq: Every day | ORAL | Status: DC
  Administered 2016-05-27 – 2016-05-28 (×2): 1 mg via ORAL
  Filled 2016-05-27 (×2): qty 1

## 2016-05-27 MED ORDER — CLOPIDOGREL BISULFATE 75 MG PO TABS
75.0000 mg | ORAL_TABLET | Freq: Every day | ORAL | Status: DC
  Administered 2016-05-27 – 2016-05-28 (×2): 75 mg via ORAL
  Filled 2016-05-27 (×2): qty 1

## 2016-05-27 MED ORDER — METFORMIN HCL 500 MG PO TABS
1000.0000 mg | ORAL_TABLET | Freq: Two times a day (BID) | ORAL | Status: DC
  Administered 2016-05-27 – 2016-05-28 (×2): 1000 mg via ORAL
  Filled 2016-05-27 (×2): qty 2

## 2016-05-27 MED ORDER — GABAPENTIN 100 MG PO CAPS
100.0000 mg | ORAL_CAPSULE | Freq: Three times a day (TID) | ORAL | Status: DC
  Administered 2016-05-27 – 2016-05-28 (×2): 100 mg via ORAL
  Filled 2016-05-27 (×2): qty 1

## 2016-05-27 MED ORDER — TRAZODONE HCL 50 MG PO TABS: 50.0000 mg | ORAL_TABLET | Freq: Every evening | ORAL | Status: DC | PRN

## 2016-05-27 MED ORDER — ATORVASTATIN CALCIUM 40 MG PO TABS: 40.0000 mg | ORAL_TABLET | Freq: Every day | ORAL | Status: DC

## 2016-05-27 NOTE — ED Triage Notes (Addendum)
TC from Pt's Female friend. Friend stated "You People are driving her crazy." I just talked to her and I want to know why she did not go to [facility] where they will do something for her. She can not take much more of this. Caller wanted information about Pt. Condition. Caller was transferred to Pt ,so information could be given by PT.
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
## 2016-05-27 NOTE — ED Notes (Signed)
Sitter requested, security wanded patient. Pt cooperative.

## 2016-05-27 NOTE — ED Notes (Signed)
Pt ambulated to restroom, pt walked with a steady gait and had no complaints.

## 2016-05-27 NOTE — ED Notes (Signed)
Pt was given snack 

## 2016-05-27 NOTE — ED Notes (Signed)
Spoke with staffing, aware of order for SI sitter, unable to provide at this time. ED EMT at bedside as SI sitter at this time.

## 2016-05-27 NOTE — ED Triage Notes (Signed)
TTS completed. 

## 2016-05-27 NOTE — ED Triage Notes (Signed)
Patient comes in per [facility] with cp and SI. CP started yest afternoon, gradual pain. 5 out of 10 right now and radiates to shoulder blades. Hx anxiety. Patient is tearful and just d/c from pysch facility. Hx of MI, bypass, DM. EKG RBBB. fsbs 267. V/s 142/84, 112 HR, 18 RR, 99% RA. 20 LAC. 324 mg aspirin. Nitro x2.

## 2016-05-27 NOTE — ED Notes (Signed)
Patient was given Salli [employer] and Crackers, and A regular Diet was taken for [employer]

## 2016-05-27 NOTE — BHH Counselor (Signed)
Clinician spoke to [place], RN and expressed a TTS consult was completed for pt. RN reported, tried to cancel repeat consult. Clinician discussed disposition (gero-psychiatric placement) from pt's TTS consult.    Edd Fabian, MS, [facility], [facility] Triage Specialist [phone]
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
## 2016-05-28 NOTE — ED Notes (Signed)
Patient was given a snack and Drink, and A Regular Diet was taken for Lunch. 

## 2016-05-28 NOTE — Discharge Instructions (Signed)
As discussed, it is important that you follow up as soon as possible with your physician for continued management of your condition. ° °If you develop any new, or concerning changes in your condition, please return to the emergency department immediately. ° °

## 2016-05-28 NOTE — ED Notes (Signed)
Pt refusing to go to [facility].

## 2016-05-28 NOTE — ED Notes (Signed)
PA at bedside speaking with patient about placement.

## 2016-05-28 NOTE — Progress Notes (Signed)
Pt accepted to [facility]. Dr. Terrial Rhodes accepting. Report [phone]. Jasmine in intake states pt can arrive anytime. Spoke with [facility] re: pt's bed at [facility].  Sharren Bridge, MSW, LCSW Clinical Social Work, Disposition  05/28/2016 [phone]

## 2016-05-28 NOTE — ED Notes (Signed)
Pt is insisting she would not hurt herself if she went home. MD at bedside.

## 2016-05-28 NOTE — ED Notes (Signed)
Pt has bed at [facility] in [place], [place]. Report to call [phone], Accepting Doctor Terrial Rhodes.

## 2016-05-28 NOTE — ED Notes (Signed)
Pt states shes hearing voices by the frog on her shoulder to not eat and to only take her medications to overdose.

## 2016-05-28 NOTE — ED Notes (Signed)
Pt signed no harm contract

## 2016-05-28 NOTE — ED Notes (Signed)
Pt given breakfast tray

## 2016-05-28 NOTE — [facility] Counselor (Signed)
[place] received a call from [facility] at [facility] expressing that she will inform the doctor the pt still needs placement.   Edd Fabian, MS, [facility], [facility] Triage Specialist [phone]

## 2016-05-28 NOTE — ED Provider Notes (Signed)
10:05 AM Tuesday, May 28, 2016  Patient has been seen by our psychiatry team. Patient is here voluntarily. They recommended the patient be hospitalized given her hallucinations. I discussed this with the patient, who states that she has ongoing hallucinations, hears voices, instructing her to do things, but has no suicidal ideation of her own, she states"I will not kill myself"I swear"with his hand to Jesus". I had a discussion with her about her initial evaluation yesterday, concern for suicidal ideation that point, she states that her initial description of suicidal ideation was simply retelling of what the voices were telling her what to do, denies any of her own personal suicidal ideation, confirms that she has a physician with whom she'll follow up today or tomorrow, cardiologist as well. She states that she must go home, take her medication, see these practitioners. Given the patient's voluntary status, and denial of any ongoing suicidal ideation, Axis II physicians, and medication, she was discharged, per her request.     Carmin Muskrat, MD 05/28/16 1007

## 2016-05-29 ENCOUNTER — Ambulatory Visit (INDEPENDENT_AMBULATORY_CARE_PROVIDER_SITE_OTHER): Payer: Medicare Other | Admitting: Primary Care

## 2016-05-29 ENCOUNTER — Encounter: Payer: Self-pay | Admitting: Primary Care

## 2016-05-29 VITALS — BP 136/88 | HR 96 | Temp 98.4°F | Ht 63.0 in | Wt 139.0 lb

## 2016-05-29 DIAGNOSIS — R000 Tachycardia, unspecified: Secondary | ICD-10-CM

## 2016-05-29 DIAGNOSIS — E785 Hyperlipidemia, unspecified: Secondary | ICD-10-CM | POA: Diagnosis not present

## 2016-05-29 DIAGNOSIS — E119 Type 2 diabetes mellitus without complications: Secondary | ICD-10-CM | POA: Diagnosis not present

## 2016-05-29 DIAGNOSIS — F331 Major depressive disorder, recurrent, moderate: Secondary | ICD-10-CM

## 2016-05-29 MED ORDER — METOPROLOL TARTRATE 25 MG PO TABS: 12.5000 mg | ORAL_TABLET | Freq: Every day | ORAL | 0 refills | Status: DC

## 2016-05-29 MED ORDER — METFORMIN HCL 500 MG PO TABS: 1000.0000 mg | ORAL_TABLET | Freq: Two times a day (BID) | ORAL | 0 refills | Status: DC

## 2016-05-29 MED ORDER — CLOPIDOGREL BISULFATE 75 MG PO TABS: 75.0000 mg | ORAL_TABLET | Freq: Every day | ORAL | 0 refills | Status: DC

## 2016-05-29 MED ORDER — ATORVASTATIN CALCIUM 40 MG PO TABS: 40.0000 mg | ORAL_TABLET | Freq: Every day | ORAL | 3 refills | Status: DC
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
## 2016-05-29 NOTE — Progress Notes (Signed)
Pre visit review using our clinic review tool, if applicable. No additional management support is needed unless otherwise documented below in the visit note. 

## 2016-05-29 NOTE — Patient Instructions (Signed)
I sent refills of your medications to your pharmacy. Have the pharmacy notify us when you need additional refills.  You will be contacted regarding your referral to psychiatry and therapy.  Please let us know if you have not heard back within one week.   Please go to the emergency department if you experience any suicidal thoughts.  It was a pleasure meeting you!

## 2016-06-04 ENCOUNTER — Encounter: Payer: Self-pay | Admitting: Physician Assistant

## 2016-06-06 ENCOUNTER — Encounter: Payer: Self-pay | Admitting: Family Medicine

## 2016-06-06 ENCOUNTER — Ambulatory Visit (INDEPENDENT_AMBULATORY_CARE_PROVIDER_SITE_OTHER): Payer: Medicare Other | Admitting: Family Medicine

## 2016-06-06 VITALS — BP 122/88 | HR 98 | Temp 97.7°F | Ht 63.0 in | Wt 136.8 lb

## 2016-06-06 DIAGNOSIS — E538 Deficiency of other specified B group vitamins: Secondary | ICD-10-CM | POA: Diagnosis not present

## 2016-06-06 DIAGNOSIS — E441 Mild protein-calorie malnutrition: Secondary | ICD-10-CM | POA: Diagnosis not present

## 2016-06-06 DIAGNOSIS — Z951 Presence of aortocoronary bypass graft: Secondary | ICD-10-CM | POA: Diagnosis not present

## 2016-06-06 DIAGNOSIS — I10 Essential (primary) hypertension: Secondary | ICD-10-CM | POA: Diagnosis not present

## 2016-06-06 DIAGNOSIS — I2510 Atherosclerotic heart disease of native coronary artery without angina pectoris: Secondary | ICD-10-CM | POA: Diagnosis not present

## 2016-06-06 DIAGNOSIS — E7800 Pure hypercholesterolemia, unspecified: Secondary | ICD-10-CM

## 2016-06-06 DIAGNOSIS — IMO0002 Reserved for concepts with insufficient information to code with codable children: Secondary | ICD-10-CM

## 2016-06-06 DIAGNOSIS — E1165 Type 2 diabetes mellitus with hyperglycemia: Secondary | ICD-10-CM | POA: Diagnosis not present

## 2016-06-06 DIAGNOSIS — F333 Major depressive disorder, recurrent, severe with psychotic symptoms: Secondary | ICD-10-CM

## 2016-06-06 DIAGNOSIS — E1140 Type 2 diabetes mellitus with diabetic neuropathy, unspecified: Secondary | ICD-10-CM

## 2016-06-06 MED ORDER — CYANOCOBALAMIN 1000 MCG/ML IJ SOLN
1000.0000 ug | Freq: Once | INTRAMUSCULAR | Status: AC
  Administered 2016-06-06: 1000 ug via INTRAMUSCULAR

## 2016-06-06 NOTE — Progress Notes (Signed)
BP 122/88   Pulse 98   Temp 97.7 F (36.5 C)   Ht 5\' 3"  (1.6 m)   Wt 136 lb 12.8 oz (62.1 kg)   SpO2 100%   BMI 24.23 kg/m    CC: f/u visit Subjective:    Patient ID: Maria Fox, female    DOB: [birth year], 71 y.o.   MRN: [id number]  HPI: Maria Fox is a 71 y.o. female presenting on 06/06/2016 for Follow-up and Medication Refill (AMBIEN, )   Saw Anda Kraft last week for hospital f/u visit, note reviewed. Again hospitalized for suicidality along with chest pain. Chest pain was found to have non cardiac cause. Accepted to inpatient psych but patient decided to go home - granted as she voluntarily presented to ER. Previously behavioral hospitalization 04/2016. Was referred to psychiatrist - appt set in June. Was referred to therapist - appt set May. 5 ER visits since 10/2015.   Previously struggled with med adherence, however now she has been regularly taking her medications. Ongoing struggle with chronic persistent malaise/fatigue. Otherwise doing well. Denies chest pain, tightness, dyspnea. She feels mood is better on current regimen. Partner endorses ongoing anxiety concerns.  She requests referral to cardiologist Dr Radford Pax who she saw previously.   Partner [name] does fill weekly pill boxes.  Again has questions about meds - not currently taking imdur or folate or metoprolol or loxapine.   Relevant past medical, surgical, family and social history reviewed and updated as indicated. Interim medical history since our last visit reviewed. Allergies and medications reviewed and updated. Outpatient Medications Prior to Visit  Medication Sig Dispense Refill  . aspirin EC 81 MG tablet Take 81 mg by mouth daily.     Marland Kitchen atorvastatin (LIPITOR) 40 MG tablet Take 1 tablet (40 mg total) by mouth at bedtime. 90 tablet 3  . clopidogrel (PLAVIX) 75 MG tablet Take 1 tablet (75 mg total) by mouth daily. 30 tablet 0  . DULoxetine (CYMBALTA) 60 MG capsule Take 60 mg by mouth daily.    . metFORMIN  (GLUCOPHAGE) 500 MG tablet Take 2 tablets (1,000 mg total) by mouth 2 (two) times daily with a meal. (Patient taking differently: Take 500 mg by mouth 2 (two) times daily with a meal. ) 60 tablet 0  . metoCLOPramide (REGLAN) 10 MG tablet Take 10 mg by mouth every 6 (six) hours as needed for nausea or vomiting.    . folic acid (FOLVITE) 1 MG tablet TAKE ONE TABLET BY MOUTH  DAILY (Patient not taking: Reported on 06/06/2016) 30 tablet 5  . gabapentin (NEURONTIN) 100 MG capsule Take 100 mg by mouth 3 (three) times daily as needed.     . isosorbide mononitrate (IMDUR) 30 MG 24 hr tablet Take 30 mg by mouth daily.    . Loxapine Succinate (LOXITANE PO) Take 75 mg by mouth daily.    . metoprolol tartrate (LOPRESSOR) 25 MG tablet Take 0.5 tablets (12.5 mg total) by mouth daily. (Patient not taking: Reported on 06/06/2016) 15 tablet 0  . mirtazapine (REMERON) 15 MG tablet Take 45 mg by mouth at bedtime.    . SUMAtriptan (IMITREX) 50 MG tablet Take 50 mg by mouth every 2 (two) hours as needed for migraine. May repeat in 2 hours if headache persists or recurs.    Marland Kitchen zolpidem (AMBIEN) 5 MG tablet Take 5 mg by mouth at bedtime as needed for sleep.     No facility-administered medications prior to visit.      Per  HPI unless specifically indicated in ROS section below Review of Systems     Objective:    BP 122/88   Pulse 98   Temp 97.7 F (36.5 C)   Ht 5\' 3"  (1.6 m)   Wt 136 lb 12.8 oz (62.1 kg)   SpO2 100%   BMI 24.23 kg/m   Wt Readings from Last 3 Encounters:  06/06/16 136 lb 12.8 oz (62.1 kg)  05/29/16 139 lb (63 kg)  05/27/16 130 lb (59 kg)    Physical Exam  Constitutional: She appears well-developed and well-nourished. No distress.  HENT:  Mouth/Throat: Oropharynx is clear and moist. No oropharyngeal exudate.  Eyes: Conjunctivae are normal. Pupils are equal, round, and reactive to light.  Cardiovascular: Normal rate, regular rhythm, normal heart sounds and intact distal pulses.   No murmur  heard. Pulmonary/Chest: Effort normal and breath sounds normal. No respiratory distress. She has no wheezes. She has no rales.  Musculoskeletal: She exhibits edema (tr pedal).  Skin: Skin is warm and dry. No rash noted.  vitiligo  Psychiatric: She has a normal mood and affect.  Pleasant, cooperative and responsive  Nursing note and vitals reviewed.  Results for orders placed or performed during the hospital encounter of 05/27/16  Comprehensive metabolic panel  Result Value Ref Range   Sodium 138 135 - 145 mmol/L   Potassium 4.5 3.5 - 5.1 mmol/L   Chloride 104 101 - 111 mmol/L   CO2 22 22 - 32 mmol/L   Glucose, Bld 190 (H) 65 - 99 mg/dL   BUN 12 6 - 20 mg/dL   Creatinine, Ser 1.25 (H) 0.44 - 1.00 mg/dL   Calcium 9.3 8.9 - 10.3 mg/dL   Total Protein 6.8 6.5 - 8.1 g/dL   Albumin 3.7 3.5 - 5.0 g/dL   AST 18 15 - 41 U/L   ALT 15 14 - 54 U/L   Alkaline Phosphatase 71 38 - 126 U/L   Total Bilirubin 0.8 0.3 - 1.2 mg/dL   GFR calc non Af Amer 42 (L) >60 mL/min   GFR calc Af Amer 48 (L) >60 mL/min   Anion gap 12 5 - 15  Ethanol  Result Value Ref Range   Alcohol, Ethyl (B) <5 <5 mg/dL  Salicylate level  Result Value Ref Range   Salicylate Lvl <3.0 2.8 - 30.0 mg/dL  Acetaminophen level  Result Value Ref Range   Acetaminophen (Tylenol), Serum <10 (L) 10 - 30 ug/mL  cbc  Result Value Ref Range   WBC 9.0 4.0 - 10.5 K/uL   RBC 4.09 3.87 - 5.11 MIL/uL   Hemoglobin 12.4 12.0 - 15.0 g/dL   HCT 36.9 36.0 - 46.0 %   MCV 90.2 78.0 - 100.0 fL   MCH 30.3 26.0 - 34.0 pg   MCHC 33.6 30.0 - 36.0 g/dL   RDW 14.7 11.5 - 15.5 %   Platelets 228 150 - 400 K/uL  Rapid urine drug screen (hospital performed)  Result Value Ref Range   Opiates NONE DETECTED NONE DETECTED   Cocaine NONE DETECTED NONE DETECTED   Benzodiazepines NONE DETECTED NONE DETECTED   Amphetamines NONE DETECTED NONE DETECTED   Tetrahydrocannabinol NONE DETECTED NONE DETECTED   Barbiturates NONE DETECTED NONE DETECTED  I-stat  troponin, ED  Result Value Ref Range   Troponin i, poc 0.01 0.00 - 0.08 ng/mL   Comment 3           Lab Results  Component Value Date   HGBA1C 7.4 (H) 11/10/2015  Assessment & Plan:   Problem List Items Addressed This Visit    CAD (coronary artery disease), native coronary artery    s/p 3v CABG 06/2014. Continues aspirin, plavix, statin.  Not currently taking metformin or imdur. Will refer back to Dr Radford Pax per patient request.       Relevant Orders   Ambulatory referral to Cardiology   DM (diabetes mellitus), type 2, uncontrolled w/neurologic complication ([place])    Compliant with metformin 500mg  daily - check A1c next labs. Not currently taking gabapentin.       HLD (hyperlipidemia)    Continue statin. Check FLP next fasting labs.       HYPERTENSION, BENIGN SYSTEMIC    Now off all antihypertensives and currently normotensive.       MDD (major depressive disorder), recurrent, severe, with psychosis ([place]) - Primary    Seems to be doing better now she's more compliant with medications. She is off loxapine but compliant with other medications (cymbalta 60mg , trazodone 15mg  nightly, and geodon 80mg  nightly). Encouraged f/u with psych and counselor. RTC 4 wks f/u visit.       Relevant Medications   traZODone (DESYREL) 50 MG tablet   Protein-calorie malnutrition ([place])    Albumin/calcium now normal on recent check. Pt endorses improving eating habits.       S/P CABG x 3   Relevant Orders   Ambulatory referral to Cardiology   Vitamin B12 deficiency    B12 shot today.      Relevant Medications   cyanocobalamin ((VITAMIN B-12)) injection 1,000 mcg (Completed)       Follow up plan: Return in about 4 weeks (around 07/04/2016) for follow up visit.  [name], MD

## 2016-06-06 NOTE — Patient Instructions (Addendum)
We will refer you back to Dr Radford Pax cardiologist.  Maria Fox to see you today.  B12 today.  Return in 1 month for follow up visit.  Med list updated.

## 2016-06-06 NOTE — Progress Notes (Signed)
Pre visit review using our clinic review tool, if applicable. No additional management support is needed unless otherwise documented below in the visit note. 

## 2016-06-09 HISTORY — PX: CARDIOVASCULAR STRESS TEST: SHX262

## 2016-06-09 NOTE — Assessment & Plan Note (Addendum)
Compliant with metformin 500mg  daily - check A1c next labs. Not currently taking gabapentin.

## 2016-06-09 NOTE — Assessment & Plan Note (Signed)
Now off all antihypertensives and currently normotensive.

## 2016-06-09 NOTE — Assessment & Plan Note (Signed)
B12 shot today. 

## 2016-06-09 NOTE — Assessment & Plan Note (Signed)
Albumin/calcium now normal on recent check. Pt endorses improving eating habits.

## 2016-06-09 NOTE — Assessment & Plan Note (Signed)
Continue statin. Check FLP next fasting labs.

## 2016-06-09 NOTE — Assessment & Plan Note (Signed)
s/p 3v CABG 06/2014. Continues aspirin, plavix, statin.  Not currently taking metformin or imdur. Will refer back to Dr Radford Pax per patient request.

## 2016-06-09 NOTE — Assessment & Plan Note (Addendum)
Seems to be doing better now she's more compliant with medications. She is off loxapine but compliant with other medications (cymbalta 60mg , trazodone 15mg  nightly, and geodon 80mg  nightly). Encouraged f/u with psych and counselor. RTC 4 wks f/u visit.

## 2016-06-13 ENCOUNTER — Encounter: Payer: Self-pay | Admitting: Cardiology

## 2016-06-13 ENCOUNTER — Ambulatory Visit: Payer: Self-pay | Admitting: Cardiology
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
## 2016-06-14 ENCOUNTER — Ambulatory Visit (INDEPENDENT_AMBULATORY_CARE_PROVIDER_SITE_OTHER): Payer: Medicare Other | Admitting: Cardiology

## 2016-06-14 ENCOUNTER — Telehealth: Payer: Self-pay

## 2016-06-14 VITALS — BP 126/76 | HR 100 | Ht 63.0 in | Wt 142.0 lb

## 2016-06-14 DIAGNOSIS — I209 Angina pectoris, unspecified: Secondary | ICD-10-CM

## 2016-06-14 DIAGNOSIS — I472 Ventricular tachycardia: Secondary | ICD-10-CM

## 2016-06-14 DIAGNOSIS — I5032 Chronic diastolic (congestive) heart failure: Secondary | ICD-10-CM

## 2016-06-14 DIAGNOSIS — E7800 Pure hypercholesterolemia, unspecified: Secondary | ICD-10-CM

## 2016-06-14 DIAGNOSIS — I951 Orthostatic hypotension: Secondary | ICD-10-CM

## 2016-06-14 DIAGNOSIS — I25119 Atherosclerotic heart disease of native coronary artery with unspecified angina pectoris: Secondary | ICD-10-CM | POA: Diagnosis not present

## 2016-06-14 DIAGNOSIS — I110 Hypertensive heart disease with heart failure: Secondary | ICD-10-CM

## 2016-06-14 DIAGNOSIS — I4729 Other ventricular tachycardia: Secondary | ICD-10-CM

## 2016-06-14 MED ORDER — GABAPENTIN 100 MG PO CAPS: 100.0000 mg | ORAL_CAPSULE | Freq: Three times a day (TID) | ORAL | 3 refills | Status: DC | PRN

## 2016-06-14 NOTE — Telephone Encounter (Signed)
rec retrial gabapentin - sent to pharmacy. update with effect.

## 2016-06-14 NOTE — Patient Instructions (Signed)
Medication Instructions:  Your physician recommends that you continue on your current medications as directed. Please refer to the Current Medication list given to you today.   Labwork: Your physician recommends that you return for FASTING lab work the same day as your stress test.   Testing/Procedures: Your physician has requested that you have a lexiscan myoview. For further information please visit [email or web address]. Please follow instruction sheet, as given.  Follow-Up: Your physician wants you to follow-up in: 6 months with Dr. Theodosia Blender assistant. You will receive a reminder letter in the mail two months in advance. If you don't receive a letter, please call our office to schedule the follow-up appointment.   Your physician wants you to follow-up in: 1 year with Dr. Radford Pax. You will receive a reminder letter in the mail two months in advance. If you don't receive a letter, please call our office to schedule the follow-up appointment.   Any Other Special Instructions Will Be Listed Below (If Applicable).     If you need a refill on your cardiac medications before your next appointment, please call your pharmacy.

## 2016-06-14 NOTE — Telephone Encounter (Signed)
Pt left v/m;pt saw cardiologist today and pt's feet are hurting and is supposed to go for stress test.cardiologist told pt is was probably neuropathy but would need PCP to call in med. Pt has gabapentin on med list but I could not reach pt by phone to see if she wanted the gabapentin. [place].last seen 06/06/16.

## 2016-06-17 NOTE — Addendum Note (Signed)
Addended by: Della Goo C on: 06/17/2016 10:00 AM   Modules accepted: Orders

## 2016-06-17 NOTE — Telephone Encounter (Signed)
Message left advising patient.  

## 2016-06-18 ENCOUNTER — Telehealth (HOSPITAL_COMMUNITY): Payer: Self-pay | Admitting: *Deleted

## 2016-06-18 NOTE — Telephone Encounter (Signed)
Patient given detailed instructions per Myocardial Perfusion Study Information Sheet for the test on 06/20/16 Patient notified to arrive 15 minutes early and that it is imperative to arrive on time for appointment to keep from having the test rescheduled.  If you need to cancel or reschedule your appointment, please call the office within 24 hours of your appointment. Failure to do so may result in a cancellation of your appointment, and a $50 no show fee. Patient verbalized understanding. [name] Jacqueline    

## 2016-06-20 ENCOUNTER — Ambulatory Visit (HOSPITAL_COMMUNITY): Payer: Medicare Other | Attending: Internal Medicine

## 2016-06-20 ENCOUNTER — Ambulatory Visit: Payer: Medicare Other | Admitting: *Deleted

## 2016-06-20 DIAGNOSIS — E7800 Pure hypercholesterolemia, unspecified: Secondary | ICD-10-CM | POA: Diagnosis not present

## 2016-06-20 DIAGNOSIS — I4510 Unspecified right bundle-branch block: Secondary | ICD-10-CM | POA: Diagnosis not present

## 2016-06-20 DIAGNOSIS — R079 Chest pain, unspecified: Secondary | ICD-10-CM | POA: Diagnosis not present

## 2016-06-20 DIAGNOSIS — Z8673 Personal history of transient ischemic attack (TIA), and cerebral infarction without residual deficits: Secondary | ICD-10-CM | POA: Diagnosis not present

## 2016-06-20 DIAGNOSIS — I25119 Atherosclerotic heart disease of native coronary artery with unspecified angina pectoris: Secondary | ICD-10-CM

## 2016-06-20 DIAGNOSIS — I2510 Atherosclerotic heart disease of native coronary artery without angina pectoris: Secondary | ICD-10-CM | POA: Insufficient documentation

## 2016-06-20 DIAGNOSIS — E119 Type 2 diabetes mellitus without complications: Secondary | ICD-10-CM | POA: Diagnosis not present

## 2016-06-20 DIAGNOSIS — I447 Left bundle-branch block, unspecified: Secondary | ICD-10-CM | POA: Insufficient documentation

## 2016-06-20 LAB — MYOCARDIAL PERFUSION IMAGING
CHL CUP NUCLEAR SRS: 8
CHL CUP RESTING HR STRESS: 88 {beats}/min
LV dias vol: 68 mL (ref 46–106)
LVSYSVOL: 36 mL
NUC STRESS TID: 1.02
Peak HR: 103 {beats}/min
RATE: 0.26
SDS: 0
SSS: 8

## 2016-06-20 MED ORDER — TECHNETIUM TC 99M TETROFOSMIN IV KIT
32.7000 | PACK | Freq: Once | INTRAVENOUS | Status: AC | PRN
  Administered 2016-06-20: 32.7 via INTRAVENOUS
  Filled 2016-06-20: qty 33

## 2016-06-20 MED ORDER — TECHNETIUM TC 99M TETROFOSMIN IV KIT
10.9000 | PACK | Freq: Once | INTRAVENOUS | Status: AC | PRN
  Administered 2016-06-20: 10.9 via INTRAVENOUS
  Filled 2016-06-20: qty 11

## 2016-06-20 MED ORDER — REGADENOSON 0.4 MG/5ML IV SOLN
0.4000 mg | Freq: Once | INTRAVENOUS | Status: AC
  Administered 2016-06-20: 0.4 mg via INTRAVENOUS

## 2016-06-21 ENCOUNTER — Telehealth: Payer: Self-pay | Admitting: Cardiology

## 2016-06-21 LAB — HEPATIC FUNCTION PANEL
ALT: 9 IU/L (ref 0–32)
AST: 29 IU/L (ref 0–40)
Albumin: 3.9 g/dL (ref 3.5–4.8)
Alkaline Phosphatase: 108 IU/L (ref 39–117)
BILIRUBIN TOTAL: 0.2 mg/dL (ref 0.0–1.2)
Bilirubin, Direct: 0.04 mg/dL (ref 0.00–0.40)
TOTAL PROTEIN: 6.5 g/dL (ref 6.0–8.5)

## 2016-06-21 LAB — LIPID PANEL
CHOLESTEROL TOTAL: 111 mg/dL (ref 100–199)
Chol/HDL Ratio: 2.1 ratio (ref 0.0–4.4)
HDL: 54 mg/dL (ref >39)
LDL CALC: 37 mg/dL (ref 0–99)
TRIGLYCERIDES: 98 mg/dL (ref 0–149)
VLDL CHOLESTEROL CAL: 20 mg/dL (ref 5–40)

## 2016-06-21 NOTE — Telephone Encounter (Signed)
New Message     Pt returning George call about lab results, please leave complete message on voicemail thank you

## 2016-06-21 NOTE — Telephone Encounter (Signed)
Left detailed message with lab results as requested by pt.  Advised pt to call back if any questions.

## 2016-06-22 ENCOUNTER — Encounter: Payer: Self-pay | Admitting: Family Medicine

## 2016-06-26 ENCOUNTER — Telehealth: Payer: Self-pay | Admitting: Cardiology

## 2016-06-26 DIAGNOSIS — I110 Hypertensive heart disease with heart failure: Secondary | ICD-10-CM

## 2016-06-26 DIAGNOSIS — R931 Abnormal findings on diagnostic imaging of heart and coronary circulation: Secondary | ICD-10-CM

## 2016-06-26 NOTE — Telephone Encounter (Signed)
New Message  Pt voiced returning call to nurse about results.  Please f/u

## 2016-06-26 NOTE — Telephone Encounter (Signed)
Left message to call back  

## 2016-06-26 NOTE — Telephone Encounter (Signed)
New Message ° °Pt voiced returning nurses call. ° °Please f/u °

## 2016-06-27 NOTE — Telephone Encounter (Signed)
Left message to call back  

## 2016-06-27 NOTE — Telephone Encounter (Signed)
-----   Message from Sueanne Margarita, MD sent at 06/21/2016  2:44 PM EDT ----- Nuclear stress test showed old infarct in territory of diagonal which had known total occlusion and otherwise no ischemia.  EF 47% - EF normal on echo a year ago - please repeat 2D echo to reassess LVF.

## 2016-06-27 NOTE — Telephone Encounter (Signed)
Informed patient of results and verbal understanding expressed.  ECHO ordered for scheduling. Patient agrees with treatment plan.

## 2016-06-28 ENCOUNTER — Telehealth (HOSPITAL_COMMUNITY): Payer: Self-pay | Admitting: Cardiology

## 2016-06-29 ENCOUNTER — Other Ambulatory Visit: Payer: Self-pay | Admitting: Family Medicine

## 2016-07-02 NOTE — Telephone Encounter (Signed)
  06/28/2016 08:48 AM Phone (Outgoing) Maria, Fox (Self) [phone] (H)   Left Message - Called pt and lmsg for her to CB to get scheduled for an echo.     By Verdene Rio

## 2016-07-03 ENCOUNTER — Other Ambulatory Visit: Payer: Self-pay

## 2016-07-03 ENCOUNTER — Ambulatory Visit (HOSPITAL_COMMUNITY): Payer: Medicare Other | Attending: Cardiology

## 2016-07-03 DIAGNOSIS — R931 Abnormal findings on diagnostic imaging of heart and coronary circulation: Secondary | ICD-10-CM

## 2016-07-03 DIAGNOSIS — I110 Hypertensive heart disease with heart failure: Secondary | ICD-10-CM

## 2016-07-03 DIAGNOSIS — I340 Nonrheumatic mitral (valve) insufficiency: Secondary | ICD-10-CM | POA: Insufficient documentation

## 2016-07-18 ENCOUNTER — Ambulatory Visit (HOSPITAL_COMMUNITY): Payer: Self-pay | Admitting: Psychiatry

## 2016-07-18 ENCOUNTER — Ambulatory Visit: Payer: Medicare Other | Admitting: Family Medicine

## 2016-08-06 ENCOUNTER — Other Ambulatory Visit: Payer: Self-pay | Admitting: Primary Care

## 2016-08-06 DIAGNOSIS — R000 Tachycardia, unspecified: Secondary | ICD-10-CM

## 2016-08-11 ENCOUNTER — Other Ambulatory Visit: Payer: Self-pay | Admitting: Primary Care

## 2016-08-11 DIAGNOSIS — R000 Tachycardia, unspecified: Secondary | ICD-10-CM

## 2016-08-12 NOTE — Telephone Encounter (Signed)
Received faxed refill request for metoprolol tartrate (LOPRESSOR) 25 MG tablet.  Medication was last prescribed by Anda Kraft on 05/29/2016.  Last seen on 06/06/2016

## 2016-08-12 NOTE — Telephone Encounter (Signed)
Will refill. Was previously not taking when I saw her 05/2016. Due for f/u.

## 2016-08-26 ENCOUNTER — Encounter: Payer: Self-pay | Admitting: Family Medicine

## 2016-08-26 ENCOUNTER — Ambulatory Visit (INDEPENDENT_AMBULATORY_CARE_PROVIDER_SITE_OTHER): Payer: Medicare Other | Admitting: Family Medicine

## 2016-08-26 VITALS — BP 118/74 | HR 69 | Temp 97.7°F | Ht 63.0 in | Wt 135.2 lb

## 2016-08-26 DIAGNOSIS — N1831 Chronic kidney disease, stage 3a: Secondary | ICD-10-CM | POA: Insufficient documentation

## 2016-08-26 DIAGNOSIS — N289 Disorder of kidney and ureter, unspecified: Secondary | ICD-10-CM

## 2016-08-26 DIAGNOSIS — I951 Orthostatic hypotension: Secondary | ICD-10-CM

## 2016-08-26 DIAGNOSIS — E1140 Type 2 diabetes mellitus with diabetic neuropathy, unspecified: Secondary | ICD-10-CM | POA: Diagnosis not present

## 2016-08-26 DIAGNOSIS — I25119 Atherosclerotic heart disease of native coronary artery with unspecified angina pectoris: Secondary | ICD-10-CM

## 2016-08-26 DIAGNOSIS — N183 Chronic kidney disease, stage 3 unspecified: Secondary | ICD-10-CM | POA: Insufficient documentation

## 2016-08-26 DIAGNOSIS — F333 Major depressive disorder, recurrent, severe with psychotic symptoms: Secondary | ICD-10-CM | POA: Diagnosis not present

## 2016-08-26 DIAGNOSIS — IMO0002 Reserved for concepts with insufficient information to code with codable children: Secondary | ICD-10-CM

## 2016-08-26 DIAGNOSIS — E538 Deficiency of other specified B group vitamins: Secondary | ICD-10-CM

## 2016-08-26 DIAGNOSIS — G4700 Insomnia, unspecified: Secondary | ICD-10-CM

## 2016-08-26 DIAGNOSIS — E1165 Type 2 diabetes mellitus with hyperglycemia: Secondary | ICD-10-CM | POA: Diagnosis not present

## 2016-08-26 DIAGNOSIS — I110 Hypertensive heart disease with heart failure: Secondary | ICD-10-CM

## 2016-08-26 DIAGNOSIS — E1122 Type 2 diabetes mellitus with diabetic chronic kidney disease: Secondary | ICD-10-CM | POA: Insufficient documentation

## 2016-08-26 MED ORDER — GABAPENTIN 100 MG PO CAPS: ORAL_CAPSULE | ORAL | 6 refills | Status: DC

## 2016-08-26 MED ORDER — TRAZODONE HCL 50 MG PO TABS: 25.0000 mg | ORAL_TABLET | Freq: Every evening | ORAL | 6 refills | Status: DC | PRN

## 2016-08-26 MED ORDER — CYANOCOBALAMIN 1000 MCG/ML IJ SOLN
1000.0000 ug | Freq: Once | INTRAMUSCULAR | Status: AC
  Administered 2016-08-26: 1000 ug via INTRAMUSCULAR

## 2016-08-26 NOTE — Progress Notes (Signed)
BP 118/74   Pulse 69   Temp 97.7 F (36.5 C)   Ht 5\' 3"  (1.6 m)   Wt 135 lb 4 oz (61.3 kg)   SpO2 98%   BMI 23.96 kg/m    CC: 7mo f/u visit (late, asked to return in 4 wks) Subjective:    Patient ID: Maria Fox, female    DOB: 26-Apr-1945, 71 y.o.   MRN: [id number]  HPI: Maria Fox is a 71 y.o. female presenting on 08/26/2016 for Follow-up   Here alone today. [name] waiting out in the car.  Several hospitalizations in the last year for cardiac and psychiatric issues including suicidality.  Difficulty with med adherence due to medication confusion.  Currently off most antihypertensivesdue to orthostasis.  She stays fatigued but overall feeling better.   Insomnia - chronic issue. Mainly sleep initiation, some maintenance as well. Off trazodone and other mood medications. She never saw counselor or psychiatrist - cancelled appointments. She feels mood is doing well. Denies further SI/HI. Aware to seek ER care if recurrence.  She has lost some weight (7lbs). She has enjoyed working in the yard recently.   Echo 06/2016 showed normal LV fxn with mild diastolic dysfunction, mild MR and mild LAE. There was also abnormal ventricular septal motion with dyssynergy.   Relevant past medical, surgical, family and social history reviewed and updated as indicated. Interim medical history since our last visit reviewed. Allergies and medications reviewed and updated. Outpatient Medications Prior to Visit  Medication Sig Dispense Refill  . aspirin EC 81 MG tablet Take 81 mg by mouth daily.     Marland Kitchen atorvastatin (LIPITOR) 40 MG tablet Take 1 tablet (40 mg total) by mouth at bedtime. 90 tablet 3  . clopidogrel (PLAVIX) 75 MG tablet Take 1 tablet (75 mg total) by mouth daily. 30 tablet 0  . docusate sodium (COLACE) 100 MG capsule Take 100 mg by mouth daily as needed for mild constipation.    . metFORMIN (GLUCOPHAGE) 500 MG tablet TAKE ONE TABLET BY MOUTH TWICE A DAY WITH MEALS 60 tablet 3  .  metoprolol tartrate (LOPRESSOR) 25 MG tablet TAKE ONE-HALF TABLET BY MOUTH DAILY 45 tablet 1  . DULoxetine (CYMBALTA) 60 MG capsule Take 60 mg by mouth daily.    Marland Kitchen gabapentin (NEURONTIN) 100 MG capsule Take 1 capsule (100 mg total) by mouth 3 (three) times daily as needed. 60 capsule 3  . traZODone (DESYREL) 50 MG tablet Take 50 mg by mouth at bedtime. Rpt 1 hr if needed    . ziprasidone (GEODON) 80 MG capsule Take 80 mg by mouth at bedtime.    . metoCLOPramide (REGLAN) 10 MG tablet Take 10 mg by mouth every 6 (six) hours as needed for nausea or vomiting.     No facility-administered medications prior to visit.      Per HPI unless specifically indicated in ROS section below Review of Systems     Objective:    BP 118/74   Pulse 69   Temp 97.7 F (36.5 C)   Ht 5\' 3"  (1.6 m)   Wt 135 lb 4 oz (61.3 kg)   SpO2 98%   BMI 23.96 kg/m   Wt Readings from Last 3 Encounters:  08/26/16 135 lb 4 oz (61.3 kg)  06/20/16 142 lb (64.4 kg)  06/14/16 142 lb (64.4 kg)    Physical Exam  Constitutional: She appears well-developed and well-nourished. No distress.  HENT:  Head: Normocephalic and atraumatic.  Right Ear: External  ear normal.  Left Ear: External ear normal.  Nose: Nose normal.  Mouth/Throat: Oropharynx is clear and moist. No oropharyngeal exudate.  Eyes: Conjunctivae and EOM are normal. Pupils are equal, round, and reactive to light. No scleral icterus.  Neck: Normal range of motion. Neck supple.  Cardiovascular: Normal rate, regular rhythm, normal heart sounds and intact distal pulses.   No murmur heard. Pulmonary/Chest: Effort normal and breath sounds normal. No respiratory distress. She has no wheezes. She has no rales.  Musculoskeletal: She exhibits no edema.  See HPI for foot exam if done  Lymphadenopathy:    She has no cervical adenopathy.  Skin: Skin is warm and dry. No rash noted.  Psychiatric: She has a normal mood and affect.  Nursing note and vitals  reviewed.  Results for orders placed or performed in visit on 06/20/16  Hepatic function panel  Result Value Ref Range   Total Protein 6.5 6.0 - 8.5 g/dL   Albumin 3.9 3.5 - 4.8 g/dL   Bilirubin Total 0.2 0.0 - 1.2 mg/dL   Bilirubin, Direct 0.04 0.00 - 0.40 mg/dL   Alkaline Phosphatase 108 39 - 117 IU/L   AST 29 0 - 40 IU/L   ALT 9 0 - 32 IU/L  Lipid panel  Result Value Ref Range   Cholesterol, Total 111 100 - 199 mg/dL   Triglycerides 98 0 - 149 mg/dL   HDL 54 >39 mg/dL   VLDL Cholesterol Cal 20 5 - 40 mg/dL   LDL Calculated 37 0 - 99 mg/dL   Chol/HDL Ratio 2.1 0.0 - 4.4 ratio   Lab Results  Component Value Date   HGBA1C 7.4 (H) 11/10/2015    Lab Results  Component Value Date   VITAMINB12 191 07/09/2015      Assessment & Plan:  Med rec performed. She brings pill bottles with 2 different metoprolol tablets - I think one is wrong. I have asked her to bring both metoprolol bottles to pharmacist to verify correct medications.  Problem List Items Addressed This Visit    Autonomic orthostatic hypotension    Better on lower amt antihypertensives. No longer needs midodrine. Presumed due to diabetes.       DM (diabetes mellitus), type 2, uncontrolled w/neurologic complication ([place]) - Primary    Update A1c. Pt compliant with metformin 500mg  bid.       Relevant Orders   Hemoglobin A1c   Microalbumin / creatinine urine ratio   Hypertensive heart failure ([name])    Chronic, stable on low dose metoprolol. Continue current regimen      Insomnia    Discussed options. Will start with higher gabapentin dose at night time. If no better, restart trazodone QHS.       MDD (major depressive disorder), recurrent, severe, with psychosis ([place])    She is off psychotropic medications (prior on loxapine, cymbalta, trazodone, and geodon). She feels well off meds, declines f/u with psychiatry. I asked her to return in 3 mo for f/u.       Relevant Medications   traZODone (DESYREL) 50 MG  tablet   Renal insufficiency    Update renal panel today.       Relevant Orders   Renal function panel   Vitamin B12 deficiency    b12 shot today.       Relevant Medications   cyanocobalamin ((VITAMIN B-12)) injection 1,000 mcg (Completed) (Start on 08/26/2016  5:00 PM)       Follow up plan: Return in about 3 months (  around 11/26/2016) for medicare wellness visit.  [name], MD

## 2016-08-26 NOTE — Patient Instructions (Addendum)
Labs today - then b12 shot. Good to see you today - I'm glad you're doing well. Continue gabapentin 100mg  three times a day, but at evening dose, try 2 tablets (200mg ).  If not sleeping better with higher gabapentin dose, restart trazodone 25-50mg  nightly as needed for sleep.  Continue current medicines. Check with pharmacist about metoprolol tablets - the larger oblong tablets - I think these are the wrong medicine.  Return in 3 months for medicare wellness visit with me.

## 2016-08-26 NOTE — Assessment & Plan Note (Signed)
Update A1c. Pt compliant with metformin 500mg  bid.

## 2016-08-26 NOTE — Assessment & Plan Note (Signed)
Chronic, stable on low dose metoprolol. Continue current regimen

## 2016-08-26 NOTE — Assessment & Plan Note (Addendum)
She is off psychotropic medications (prior on loxapine, cymbalta, trazodone, and geodon). She feels well off meds, declines f/u with psychiatry. I asked her to return in 3 mo for f/u.

## 2016-08-26 NOTE — Assessment & Plan Note (Signed)
Discussed options. Will start with higher gabapentin dose at night time. If no better, restart trazodone QHS.

## 2016-08-26 NOTE — Assessment & Plan Note (Addendum)
Better on lower amt antihypertensives. No longer needs midodrine. Presumed due to diabetes.

## 2016-08-26 NOTE — Assessment & Plan Note (Signed)
b12 shot today. 

## 2016-08-26 NOTE — Assessment & Plan Note (Signed)
Update renal panel today.  

## 2016-08-27 LAB — MICROALBUMIN / CREATININE URINE RATIO
Creatinine,U: 177.8 mg/dL
MICROALB/CREAT RATIO: 0.4 mg/g (ref 0.0–30.0)
Microalb, Ur: 0.7 mg/dL (ref 0.0–1.9)

## 2016-08-27 LAB — RENAL FUNCTION PANEL
ALBUMIN: 4.2 g/dL (ref 3.5–5.2)
BUN: 17 mg/dL (ref 6–23)
CALCIUM: 9.7 mg/dL (ref 8.4–10.5)
CO2: 23 mEq/L (ref 19–32)
CREATININE: 1.14 mg/dL (ref 0.40–1.20)
Chloride: 107 mEq/L (ref 96–112)
GFR: 49.6 mL/min — ABNORMAL LOW (ref >60.00)
GLUCOSE: 122 mg/dL — AB (ref 70–99)
POTASSIUM: 4.1 meq/L (ref 3.5–5.1)
Phosphorus: 2.9 mg/dL (ref 2.3–4.6)
SODIUM: 138 meq/L (ref 135–145)

## 2016-08-27 LAB — HEMOGLOBIN A1C: HEMOGLOBIN A1C: 7.5 % — AB (ref 4.6–6.5)

## 2016-08-28 ENCOUNTER — Telehealth: Payer: Self-pay

## 2016-08-28 ENCOUNTER — Other Ambulatory Visit: Payer: Self-pay

## 2016-08-28 MED ORDER — TRAZODONE HCL 50 MG PO TABS: 25.0000 mg | ORAL_TABLET | Freq: Every evening | ORAL | 6 refills | Status: DC | PRN

## 2016-08-28 MED ORDER — GABAPENTIN 100 MG PO CAPS: ORAL_CAPSULE | ORAL | 6 refills | Status: DC

## 2016-08-28 NOTE — Telephone Encounter (Signed)
Maria Fox (DPR signed) request gabapentin and trazodone that was sent to [facility] be transferred to [facility]. I called [facility] and spoke with [facility] and she will d/c and back out of the 2 requested rx. Gabapentin and trazodone sent to [facility]. Maria Fox will ck with [facility] later to pick up.

## 2016-08-28 NOTE — Addendum Note (Signed)
Addended by: Helene Shoe on: 08/28/2016 04:49 PM   Modules accepted: Orders

## 2016-08-28 NOTE — Telephone Encounter (Signed)
The trazodone rx did not go electronically; I called [facility] and Medication phoned to [facility] at [place] as instructed.

## 2016-09-27 NOTE — Telephone Encounter (Signed)
Error; refill note done on separate message.

## 2016-10-31 ENCOUNTER — Telehealth: Payer: Self-pay | Admitting: Family Medicine

## 2016-10-31 NOTE — Telephone Encounter (Signed)
Left pt message asking to call Ebony Hail back directly at [phone] to schedule AWV + labs with Katha Cabal and CPE with PCP.  *NOTE* Last AWV 10/28/11

## 2016-11-01 ENCOUNTER — Telehealth: Payer: Self-pay | Admitting: Family Medicine

## 2016-11-01 NOTE — Telephone Encounter (Signed)
Patient Name: ODALY PERI DOB: [birth year] Initial Comment Caller states she thinks she has bronchitis or bronchal pneumonia, having chest pain and cough. Nurse Assessment Nurse: Cherie Dark RN, Jarrett Soho Date/Time (Eastern Time): 11/01/2016 10:48:52 AM Confirm and document reason for call. If symptomatic, describe symptoms. ---Caller states she thinks she might have bronchitis and has had a cough for a week. Her sputum is clear but is having some chest pain. No fever. Does the patient have any new or worsening symptoms? ---Yes Will a triage be completed? ---Yes Related visit to physician within the last 2 weeks? ---No Does the PT have any chronic conditions? (i.e. diabetes, asthma, etc.) ---Yes List chronic conditions. ---diabetes, Is this a behavioral health or substance abuse call? ---No Guidelines Guideline Title Affirmed Question Affirmed Notes Cough - Acute Productive Chest pain (Exception: MILD central chest pain, present only when coughing) Final Disposition User Go to ED Now Cherie Dark, RN, Jarrett Soho Referrals [facility] - ED Disagree/Comply: Comply

## 2016-11-03 ENCOUNTER — Emergency Department (HOSPITAL_COMMUNITY): Payer: Medicare Other

## 2016-11-03 ENCOUNTER — Encounter (HOSPITAL_COMMUNITY): Payer: Self-pay | Admitting: Emergency Medicine

## 2016-11-03 DIAGNOSIS — Z7984 Long term (current) use of oral hypoglycemic drugs: Secondary | ICD-10-CM | POA: Insufficient documentation

## 2016-11-03 DIAGNOSIS — Z7982 Long term (current) use of aspirin: Secondary | ICD-10-CM | POA: Diagnosis not present

## 2016-11-03 DIAGNOSIS — J209 Acute bronchitis, unspecified: Secondary | ICD-10-CM | POA: Insufficient documentation

## 2016-11-03 DIAGNOSIS — Z951 Presence of aortocoronary bypass graft: Secondary | ICD-10-CM | POA: Insufficient documentation

## 2016-11-03 DIAGNOSIS — I110 Hypertensive heart disease with heart failure: Secondary | ICD-10-CM | POA: Diagnosis not present

## 2016-11-03 DIAGNOSIS — I249 Acute ischemic heart disease, unspecified: Secondary | ICD-10-CM | POA: Diagnosis not present

## 2016-11-03 DIAGNOSIS — I5030 Unspecified diastolic (congestive) heart failure: Secondary | ICD-10-CM | POA: Diagnosis not present

## 2016-11-03 DIAGNOSIS — R05 Cough: Secondary | ICD-10-CM | POA: Diagnosis not present

## 2016-11-03 DIAGNOSIS — E119 Type 2 diabetes mellitus without complications: Secondary | ICD-10-CM | POA: Diagnosis not present

## 2016-11-03 DIAGNOSIS — Z79899 Other long term (current) drug therapy: Secondary | ICD-10-CM | POA: Diagnosis not present

## 2016-11-03 DIAGNOSIS — R062 Wheezing: Secondary | ICD-10-CM | POA: Diagnosis not present

## 2016-11-03 DIAGNOSIS — R079 Chest pain, unspecified: Secondary | ICD-10-CM | POA: Diagnosis present

## 2016-11-03 MED ORDER — ALBUTEROL SULFATE (2.5 MG/3ML) 0.083% IN NEBU
5.0000 mg | INHALATION_SOLUTION | Freq: Once | RESPIRATORY_TRACT | Status: AC
  Administered 2016-11-03: 5 mg via RESPIRATORY_TRACT
  Filled 2016-11-03: qty 6

## 2016-11-03 NOTE — ED Triage Notes (Signed)
Pt also reporting chest pain.

## 2016-11-03 NOTE — ED Triage Notes (Signed)
Pt reports having cough and shortness of breath for the last several days pt states that she was coughing yellow then it has progressed to clear. Pt also reports having pain when taking deep breath. Active  Dry cough at time of triage.

## 2016-11-03 NOTE — ED Notes (Signed)
Pt c/o generalized achiness, cough, runny nose, and headache onset a week ago. Pt has tried tylenol with no relief.

## 2016-11-04 ENCOUNTER — Emergency Department (HOSPITAL_COMMUNITY)
Admission: EM | Admit: 2016-11-04 | Discharge: 2016-11-04 | Disposition: A | Discharge status: Home / Self Care | Payer: Medicare Other | Attending: Emergency Medicine | Admitting: Emergency Medicine

## 2016-11-04 DIAGNOSIS — J209 Acute bronchitis, unspecified: Secondary | ICD-10-CM

## 2016-11-04 LAB — BASIC METABOLIC PANEL
ANION GAP: 8 (ref 5–15)
BUN: 14 mg/dL (ref 6–20)
CHLORIDE: 106 mmol/L (ref 101–111)
CO2: 25 mmol/L (ref 22–32)
Calcium: 9.2 mg/dL (ref 8.9–10.3)
Creatinine, Ser: 1.22 mg/dL — ABNORMAL HIGH (ref 0.44–1.00)
GFR calc Af Amer: 50 mL/min — ABNORMAL LOW (ref >60)
GFR, EST NON AFRICAN AMERICAN: 43 mL/min — AB (ref >60)
GLUCOSE: 156 mg/dL — AB (ref 65–99)
POTASSIUM: 3.5 mmol/L (ref 3.5–5.1)
Sodium: 139 mmol/L (ref 135–145)

## 2016-11-04 LAB — CBC
HEMATOCRIT: 37.5 % (ref 36.0–46.0)
HEMOGLOBIN: 12.8 g/dL (ref 12.0–15.0)
MCH: 29.9 pg (ref 26.0–34.0)
MCHC: 34.1 g/dL (ref 30.0–36.0)
MCV: 87.6 fL (ref 78.0–100.0)
Platelets: 218 10*3/uL (ref 150–400)
RBC: 4.28 MIL/uL (ref 3.87–5.11)
RDW: 14.1 % (ref 11.5–15.5)
WBC: 7 10*3/uL (ref 4.0–10.5)

## 2016-11-04 LAB — POCT I-STAT TROPONIN I: Troponin i, poc: 0.01 ng/mL (ref 0.00–0.08)

## 2016-11-04 MED ORDER — ALBUTEROL SULFATE (2.5 MG/3ML) 0.083% IN NEBU
5.0000 mg | INHALATION_SOLUTION | Freq: Once | RESPIRATORY_TRACT | Status: AC
  Administered 2016-11-04: 5 mg via RESPIRATORY_TRACT
  Filled 2016-11-04: qty 6

## 2016-11-04 MED ORDER — PREDNISONE 20 MG PO TABS
60.0000 mg | ORAL_TABLET | Freq: Once | ORAL | Status: AC
  Administered 2016-11-04: 60 mg via ORAL
  Filled 2016-11-04: qty 3

## 2016-11-04 MED ORDER — ALBUTEROL SULFATE HFA 108 (90 BASE) MCG/ACT IN AERS
2.0000 | INHALATION_SPRAY | RESPIRATORY_TRACT | Status: DC
  Administered 2016-11-04: 2 via RESPIRATORY_TRACT
  Filled 2016-11-04: qty 6.7

## 2016-11-04 MED ORDER — PREDNISONE 20 MG PO TABS: 40.0000 mg | ORAL_TABLET | Freq: Every day | ORAL | 0 refills | Status: AC

## 2016-11-04 MED ORDER — DOXYCYCLINE HYCLATE 100 MG PO TABS
100.0000 mg | ORAL_TABLET | Freq: Once | ORAL | Status: AC
  Administered 2016-11-04: 100 mg via ORAL
  Filled 2016-11-04: qty 1

## 2016-11-04 MED ORDER — IPRATROPIUM-ALBUTEROL 0.5-2.5 (3) MG/3ML IN SOLN
3.0000 mL | Freq: Once | RESPIRATORY_TRACT | Status: AC
  Administered 2016-11-04: 3 mL via RESPIRATORY_TRACT
  Filled 2016-11-04: qty 3

## 2016-11-04 MED ORDER — DOXYCYCLINE HYCLATE 100 MG PO CAPS: 100.0000 mg | ORAL_CAPSULE | Freq: Two times a day (BID) | ORAL | 0 refills | Status: DC

## 2016-11-04 MED ORDER — ALBUTEROL SULFATE HFA 108 (90 BASE) MCG/ACT IN AERS: 2.0000 | INHALATION_SPRAY | RESPIRATORY_TRACT | 0 refills | Status: DC | PRN

## 2016-11-04 NOTE — ED Notes (Addendum)
Spasmodic cough. Patient given gingerale and crackers with prednisone
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
## 2016-11-04 NOTE — ED Notes (Signed)
Pt reports improvement in breathing after albuterol treatment. Pt to return to lobby at this time.

## 2016-11-05 NOTE — Telephone Encounter (Signed)
Noted seen at er.

## 2016-11-12 ENCOUNTER — Encounter: Payer: Self-pay | Admitting: Family Medicine

## 2016-11-12 ENCOUNTER — Ambulatory Visit (INDEPENDENT_AMBULATORY_CARE_PROVIDER_SITE_OTHER): Payer: Medicare Other | Admitting: Family Medicine

## 2016-11-12 VITALS — BP 130/84 | HR 70 | Temp 97.6°F | Wt 140.0 lb

## 2016-11-12 DIAGNOSIS — I25119 Atherosclerotic heart disease of native coronary artery with unspecified angina pectoris: Secondary | ICD-10-CM | POA: Diagnosis not present

## 2016-11-12 DIAGNOSIS — L80 Vitiligo: Secondary | ICD-10-CM

## 2016-11-12 DIAGNOSIS — F333 Major depressive disorder, recurrent, severe with psychotic symptoms: Secondary | ICD-10-CM | POA: Diagnosis not present

## 2016-11-12 DIAGNOSIS — R05 Cough: Secondary | ICD-10-CM

## 2016-11-12 DIAGNOSIS — R059 Cough, unspecified: Secondary | ICD-10-CM | POA: Insufficient documentation

## 2016-11-12 MED ORDER — BENZONATATE 100 MG PO CAPS: 100.0000 mg | ORAL_CAPSULE | Freq: Two times a day (BID) | ORAL | 0 refills | Status: DC | PRN

## 2016-11-12 MED ORDER — TRIAMCINOLONE ACETONIDE 0.1 % EX CREA: 1.0000 "application " | TOPICAL_CREAM | Freq: Two times a day (BID) | CUTANEOUS | 3 refills | Status: DC

## 2016-11-12 NOTE — Assessment & Plan Note (Signed)
Reviewed recent ER records with stable CXR and EKG.  Treated for bronchitis with doxy, prednisone, albuterol.  She has not been taking albuterol. Overall benign exam today. I don't see evidence of ongoing bacterial infection so will not prescribe abx. I did suggest she be more liberal with albuterol inhaler use as this should help with her ongoing sensation of wheezing and dyspnea. Will also treat with tessalon perls. Reassured, further supportive care reviewed. Pt agrees with plan.

## 2016-11-12 NOTE — Progress Notes (Signed)
BP 130/84   Pulse 70   Temp 97.6 F (36.4 C) (Oral)   Wt 140 lb (63.5 kg)   SpO2 95%   BMI 24.80 kg/m    CC: ER f/u visit Subjective:    Patient ID: Maria Fox, female    DOB: 1946-01-06, 71 y.o.   MRN: [id number]  HPI: CHARLEY LAFRANCE is a 71 y.o. female presenting on 11/12/2016 for Hospitalization Follow-up   Recent ER visit 11/04/2016 dx with bronchitis with bronchospasm, treated with doxy, prednisone, albuterol inhaler. CXR non acute. She doesn't feel she is improving. Ongoing productive cough, chest discomfort, ongoing wheezing and dyspnea. Cough keeping her up from sleep. She has not been using albuterol inhaler.   No fevers/chills, chest congestion, ear or tooth pain. She is not producing more mucous than normal.   Vitiligo flare - requests steroid cream.   Prior saw psychiatrist in [place], [place] remember name  Relevant past medical, surgical, family and social history reviewed and updated as indicated. Interim medical history since our last visit reviewed. Allergies and medications reviewed and updated. Outpatient Medications Prior to Visit  Medication Sig Dispense Refill  . albuterol (PROVENTIL HFA;VENTOLIN HFA) 108 (90 Base) MCG/ACT inhaler Inhale 2 puffs into the lungs every 4 (four) hours as needed for wheezing or shortness of breath. 1 Inhaler 0  . aspirin EC 81 MG tablet Take 81 mg by mouth daily.     Marland Kitchen atorvastatin (LIPITOR) 40 MG tablet Take 1 tablet (40 mg total) by mouth at bedtime. 90 tablet 3  . clopidogrel (PLAVIX) 75 MG tablet Take 1 tablet (75 mg total) by mouth daily. 30 tablet 0  . docusate sodium (COLACE) 100 MG capsule Take 100 mg by mouth daily as needed for mild constipation.    Marland Kitchen doxycycline (VIBRAMYCIN) 100 MG capsule Take 1 capsule (100 mg total) by mouth 2 (two) times daily. 14 capsule 0  . gabapentin (NEURONTIN) 100 MG capsule Take one tablet in am, one in afternoon, two in evening 120 capsule 6  . metFORMIN (GLUCOPHAGE) 500 MG tablet TAKE  ONE TABLET BY MOUTH TWICE A DAY WITH MEALS 60 tablet 3  . metoCLOPramide (REGLAN) 10 MG tablet Take 10 mg by mouth every 6 (six) hours as needed for nausea or vomiting.    . metoprolol tartrate (LOPRESSOR) 25 MG tablet TAKE ONE-HALF TABLET BY MOUTH DAILY 45 tablet 1  . traZODone (DESYREL) 50 MG tablet Take 0.5-1 tablets (25-50 mg total) by mouth at bedtime as needed for sleep. 30 tablet 6   No facility-administered medications prior to visit.      Per HPI unless specifically indicated in ROS section below Review of Systems     Objective:    BP 130/84   Pulse 70   Temp 97.6 F (36.4 C) (Oral)   Wt 140 lb (63.5 kg)   SpO2 95%   BMI 24.80 kg/m   Wt Readings from Last 3 Encounters:  11/12/16 140 lb (63.5 kg)  11/03/16 145 lb 4.8 oz (65.9 kg)  08/26/16 135 lb 4 oz (61.3 kg)    Physical Exam  Constitutional: She appears well-developed and well-nourished. No distress.  HENT:  Mouth/Throat: Oropharynx is clear and moist. No oropharyngeal exudate.  Cardiovascular: Normal rate, regular rhythm, normal heart sounds and intact distal pulses.   No murmur heard. Pulmonary/Chest: No respiratory distress. She has wheezes (faint exp). She has no rales.  Musculoskeletal: She exhibits no edema.  Skin: Rash noted. There is erythema.  Erythematous patches  of vitiligo on dorsal hands and right upper arm  Psychiatric: Her mood appears anxious. She exhibits a depressed mood.  Nursing note and vitals reviewed.  Results for orders placed or performed during the hospital encounter of 93/79/02  Basic metabolic panel  Result Value Ref Range   Sodium 139 135 - 145 mmol/L   Potassium 3.5 3.5 - 5.1 mmol/L   Chloride 106 101 - 111 mmol/L   CO2 25 22 - 32 mmol/L   Glucose, Bld 156 (H) 65 - 99 mg/dL   BUN 14 6 - 20 mg/dL   Creatinine, Ser 1.22 (H) 0.44 - 1.00 mg/dL   Calcium 9.2 8.9 - 10.3 mg/dL   GFR calc non Af Amer 43 (L) >60 mL/min   GFR calc Af Amer 50 (L) >60 mL/min   Anion gap 8 5 - 15  CBC    Result Value Ref Range   WBC 7.0 4.0 - 10.5 K/uL   RBC 4.28 3.87 - 5.11 MIL/uL   Hemoglobin 12.8 12.0 - 15.0 g/dL   HCT 37.5 36.0 - 46.0 %   MCV 87.6 78.0 - 100.0 fL   MCH 29.9 26.0 - 34.0 pg   MCHC 34.1 30.0 - 36.0 g/dL   RDW 14.1 11.5 - 15.5 %   Platelets 218 150 - 400 K/uL  POCT i-Stat troponin I  Result Value Ref Range   Troponin i, poc 0.01 0.00 - 0.08 ng/mL   Comment 3              Assessment & Plan:  Pt to keep upcoming appt later this month . Problem List Items Addressed This Visit    Cough - Primary    Reviewed recent ER records with stable CXR and EKG.  Treated for bronchitis with doxy, prednisone, albuterol.  She has not been taking albuterol. Overall benign exam today. I don't see evidence of ongoing bacterial infection so will not prescribe abx. I did suggest she be more liberal with albuterol inhaler use as this should help with her ongoing sensation of wheezing and dyspnea. Will also treat with tessalon perls. Reassured, further supportive care reviewed. Pt agrees with plan.       MDD (major depressive disorder), recurrent, severe, with psychosis ([place])    Self took off most psychotropics except for trazodone. She and partner note deteriorated mood. Given extensive psych history including recent hospitalizations, I did recommend she establish with local psychiatrist for medication management. Prior psych was in [place], pt requests new referral today which was placed.       Relevant Orders   Ambulatory referral to Psychiatry   Vitiligo (Chronic)    Rx TCI cream for vitiligo flare.           Follow up plan: Return if symptoms worsen or fail to improve.  [name], MD

## 2016-11-12 NOTE — Assessment & Plan Note (Signed)
Rx TCI cream for vitiligo flare.

## 2016-11-12 NOTE — Assessment & Plan Note (Signed)
Self took off most psychotropics except for trazodone. She and partner note deteriorated mood. Given extensive psych history including recent hospitalizations, I did recommend she establish with local psychiatrist for medication management. Prior psych was in [place], pt requests new referral today which was placed.

## 2016-11-12 NOTE — Patient Instructions (Addendum)
We will set you up with a local psychiatrist.  Lungs sound overall ok - may use albuterol as needed for shortness of breath or wheezing. May also take tessalon perls as needed for cough. Let us know if fever >101 or worsening cough develops.  Continue other medicines You may use triamcinolone steroid cream for vitiligo.

## 2016-11-15 ENCOUNTER — Telehealth: Payer: Self-pay | Admitting: Family Medicine

## 2016-11-15 NOTE — Telephone Encounter (Signed)
Left message asking pt to call the office regarding referral °

## 2016-11-29 ENCOUNTER — Ambulatory Visit: Payer: Self-pay | Admitting: Family Medicine

## 2016-12-04 ENCOUNTER — Telehealth: Payer: Self-pay | Admitting: Family Medicine

## 2016-12-04 ENCOUNTER — Ambulatory Visit: Payer: Medicare Other | Admitting: Family Medicine

## 2016-12-04 NOTE — Telephone Encounter (Signed)
May place on Friday, would offer sooner appt with other provider if available. May cancel today's appt. Thanks.

## 2016-12-04 NOTE — Telephone Encounter (Signed)
Patient missed her appointment today because her ride showed up late.  Patient said her wellness visit was suppose to be today.  She said she already saw Dr.G for her follow up from the hospital.  Patient said she's coughing and can't half breathe. Her appointment was scheduled to be a follow up. Dr.G doesn't have any openings for the rest of the week,except a 15 minute appointment on Friday. Please advise. Patient said a detailed message can be left on her voice mail.  If patient doesn't feel better, she said she'll go to the ER.  Patient didn't want to talk to [facility]. Can I cancel patient's appointment for today?

## 2016-12-05 ENCOUNTER — Encounter: Payer: Self-pay | Admitting: Internal Medicine

## 2016-12-05 ENCOUNTER — Ambulatory Visit (INDEPENDENT_AMBULATORY_CARE_PROVIDER_SITE_OTHER): Payer: Medicare Other | Admitting: Internal Medicine

## 2016-12-05 VITALS — BP 126/78 | HR 83 | Temp 97.6°F | Wt 141.0 lb

## 2016-12-05 DIAGNOSIS — R05 Cough: Secondary | ICD-10-CM | POA: Diagnosis not present

## 2016-12-05 DIAGNOSIS — I25119 Atherosclerotic heart disease of native coronary artery with unspecified angina pectoris: Secondary | ICD-10-CM

## 2016-12-05 DIAGNOSIS — R059 Cough, unspecified: Secondary | ICD-10-CM

## 2016-12-05 NOTE — Patient Instructions (Signed)
Cough, Adult A cough helps to clear your throat and lungs. A cough may last only 2-3 weeks (acute), or it may last longer than 8 weeks (chronic). Many different things can cause a cough. A cough may be a sign of an illness or another medical condition. Follow these instructions at home:  Pay attention to any changes in your cough.  Take medicines only as told by your doctor. ? If you were prescribed an antibiotic medicine, take it as told by your doctor. Do not stop taking it even if you start to feel better. ? Talk with your doctor before you try using a cough medicine.  Drink enough fluid to keep your pee (urine) clear or pale yellow.  If the air is dry, use a cold steam vaporizer or humidifier in your home.  Stay away from things that make you cough at work or at home.  If your cough is worse at night, try using extra pillows to raise your head up higher while you sleep.  Do not smoke, and try not to be around smoke. If you need help quitting, ask your doctor.  Do not have caffeine.  Do not drink alcohol.  Rest as needed. Contact a doctor if:  You have new problems (symptoms).  You cough up yellow fluid (pus).  Your cough does not get better after 2-3 weeks, or your cough gets worse.  Medicine does not help your cough and you are not sleeping well.  You have pain that gets worse or pain that is not helped with medicine.  You have a fever.  You are losing weight and you do not know why.  You have night sweats. Get help right away if:  You cough up blood.  You have trouble breathing.  Your heartbeat is very fast. This information is not intended to replace advice given to you by your health care provider. Make sure you discuss any questions you have with your health care provider. Document Released: 11/08/2010 Document Revised: 08/03/2015 Document Reviewed: 05/04/2014 Elsevier Interactive Patient Education  2018 [employer]  

## 2016-12-05 NOTE — Telephone Encounter (Signed)
Scheduled 12/26/16

## 2016-12-05 NOTE — Progress Notes (Signed)
Subjective:    Patient ID: Maria Fox, female    DOB: August 16, 1945, 71 y.o.   MRN: [id number]  HPI  Pt presents to the clinic today with c/o persistent cough and mild shortness of breath. She was seen in the ER 8/27, diagnosed with bronchitis and treated with Doxycycline, Prednisone and an Albuterol inhaler. She followed up with her PCP 9/4 for the same, with reports of minimal improvement in symptoms. At that time, Dr. Lynnae Sandhoff felt like she did not have any evidence of ongoing bacterial infection. He advised her to continue to use her Albuterol inhaler. He also gave her a RX for Tessalon Pearls to take as needed for cough. Since that time, she reports persistent cough and shortness of breath. The cough is productive of yellow/green mucous. She has chest tightness with coughing. Her coughing seems worse at night. She has tried Tylenol with minimal relief.   Review of Systems      Past Medical History:  Diagnosis Date  . Angioedema   . Autoimmune deficiency syndrome ([place])   . CAD (coronary artery disease), native coronary artery 06/2014   s/p NSTEMI with cath showing severe diffuse disease of the mid LAD, occluded large diagonal, 80-90% prox OM and normal dominant RCA.   . Carotid arterial disease ([name])    <50% bilaterally  . Closed fracture of maxilla ([place])   . CVA (cerebral infarction) 07/2014   bilateral corona radiata - periCABG  . Dermatitis    eval [name] 2011: eczema, eval [name] 2011: bx negative for lichen simplex or derm herpetiformis  . Diastolic CHF ([place]) 7/00/1749  . DM (diabetes mellitus), type 2, uncontrolled w/neurologic complication ([place]) 4/49/6759   ?autonomic neuropathy, gastroparesis (06/2014)   . HCAP (healthcare-associated pneumonia) 06/2014  . History of chicken pox   . HLD (hyperlipidemia)   . Hx of migraines    remote  . Hypertension   . Maxillary fracture ([place])   . Multiple allergies    mold, wool, dust, feathers  . Myocardial infarction ([place]) 2016  .  Orthostatic hypotension 07/2015  . Pleural effusion, left   . S/P lens implant    left side ([name])  . UTI (urinary tract infection) 06/2014  . Vitiligo     Current Outpatient Prescriptions  Medication Sig Dispense Refill  . albuterol (PROVENTIL HFA;VENTOLIN HFA) 108 (90 Base) MCG/ACT inhaler Inhale 2 puffs into the lungs every 4 (four) hours as needed for wheezing or shortness of breath. 1 Inhaler 0  . aspirin EC 81 MG tablet Take 81 mg by mouth daily.     Marland Kitchen atorvastatin (LIPITOR) 40 MG tablet Take 1 tablet (40 mg total) by mouth at bedtime. 90 tablet 3  . clopidogrel (PLAVIX) 75 MG tablet Take 1 tablet (75 mg total) by mouth daily. 30 tablet 0  . docusate sodium (COLACE) 100 MG capsule Take 100 mg by mouth daily as needed for mild constipation.    . gabapentin (NEURONTIN) 100 MG capsule Take one tablet in am, one in afternoon, two in evening 120 capsule 6  . metFORMIN (GLUCOPHAGE) 500 MG tablet TAKE ONE TABLET BY MOUTH TWICE A DAY WITH MEALS 60 tablet 3  . metoCLOPramide (REGLAN) 10 MG tablet Take 10 mg by mouth every 6 (six) hours as needed for nausea or vomiting.    . metoprolol tartrate (LOPRESSOR) 25 MG tablet TAKE ONE-HALF TABLET BY MOUTH DAILY 45 tablet 1  . traZODone (DESYREL) 50 MG tablet Take 0.5-1 tablets (25-50 mg total) by mouth at  bedtime as needed for sleep. 30 tablet 6  . triamcinolone cream (KENALOG) 0.1 % Apply 1 application topically 2 (two) times daily. Apply to AA. 30 g 3   No current facility-administered medications for this visit.     Allergies  Allergen Reactions  . Mushroom Extract Complex Anaphylaxis  . Codeine Nausea And Vomiting  . Penicillins Hives and Swelling    Has patient had a PCN reaction causing immediate rash, facial/tongue/throat swelling, SOB or lightheadedness with hypotension: Yes Has patient had a PCN reaction causing severe rash involving mucus membranes or skin necrosis: Yes Has patient had a PCN reaction that required hospitalization  Yes Has patient had a PCN reaction occurring within the last 10 years: No If all of the above answers are "NO", then may proceed with Cephalosporin use.   . Sulfa Antibiotics Nausea And Vomiting  . Erythromycin Base Rash    Family History  Problem Relation Age of Onset  . Cancer Father        throat  . Diabetes type II Mother   . Hypertension Mother   . Hyperlipidemia Mother   . Cancer Maternal Grandmother        cervical  . Hypertension Brother   . Hyperlipidemia Brother   . Asthma Brother   . Coronary artery disease Neg Hx   . Stroke Neg Hx     Social History   Social History  . Marital status: Single    Spouse name: N/A  . Number of children: N/A  . Years of education: N/A   Occupational History  . mail room [employer]   Social History Main Topics  . Smoking status: Never Smoker  . Smokeless tobacco: Never Used  . Alcohol use No  . Drug use: No  . Sexual activity: Not on file   Other Topics Concern  . Not on file   Social History Narrative   Caffeine: occasional   Lives with friend, [name], 1 cat   Occupation: Web designer, and mailer at [employer]   Edu: HS   Activity: walks daily (2-3 blocks)   Diet: good water, daily fruits/vegetables      [facility] unable to reach patient to establish care (04/2015)     Constitutional: Denies fever, malaise, fatigue, headache or abrupt weight changes.  HEENT: Denies eye pain, eye redness, ear pain, ringing in the ears, wax buildup, runny nose, nasal congestion, bloody nose, or sore throat. Respiratory: Pt reports cough and shortness of breath. Denies difficulty breathing.   Cardiovascular: Denies chest pain, chest tightness, palpitations or swelling in the hands or feet.   No other specific complaints in a complete review of systems (except as listed in HPI above).  Objective:   Physical Exam  BP 126/78   Pulse 83   Temp 97.6 F (36.4 C) (Oral)   Wt 141 lb (64 kg)   SpO2  98%   BMI 24.98 kg/m  Wt Readings from Last 3 Encounters:  12/05/16 141 lb (64 kg)  11/12/16 140 lb (63.5 kg)  11/03/16 145 lb 4.8 oz (65.9 kg)    General: Appears her stated age, chronically ill appearing, in NAD. Neck:  No adenopathy noted. Pulmonary/Chest: Normal effort and positive vesicular breath sounds. No respiratory distress. No wheezes, rales or ronchi noted.   BMET    Component Value Date/Time   NA 139 11/03/2016 2331   K 3.5 11/03/2016 2331   CL 106 11/03/2016 2331   CO2 25 11/03/2016  2331   GLUCOSE 156 (H) 11/03/2016 2331   BUN 14 11/03/2016 2331   CREATININE 1.22 (H) 11/03/2016 2331   CALCIUM 9.2 11/03/2016 2331   GFRNONAA 43 (L) 11/03/2016 2331   GFRAA 50 (L) 11/03/2016 2331    Lipid Panel     Component Value Date/Time   CHOL 111 06/20/2016 0738   TRIG 98 06/20/2016 0738   HDL 54 06/20/2016 0738   CHOLHDL 2.1 06/20/2016 0738   CHOLHDL 5.4 07/09/2015 0437   VLDL 37 07/09/2015 0437   LDLCALC 37 06/20/2016 0738    CBC    Component Value Date/Time   WBC 7.0 11/03/2016 2331   RBC 4.28 11/03/2016 2331   HGB 12.8 11/03/2016 2331   HCT 37.5 11/03/2016 2331   PLT 218 11/03/2016 2331   MCV 87.6 11/03/2016 2331   MCH 29.9 11/03/2016 2331   MCHC 34.1 11/03/2016 2331   RDW 14.1 11/03/2016 2331   LYMPHSABS 1.6 07/25/2015 1546   MONOABS 0.5 07/25/2015 1546   EOSABS 0.3 07/25/2015 1546   BASOSABS 0.1 07/25/2015 1546    Hgb A1C Lab Results  Component Value Date   HGBA1C 7.5 (H) 08/26/2016           Assessment & Plan:   Cough:  Likely post infectious vs allergy related Advised her to use the Albuterol inhaler as previously discussed Robitussin as needed for cough Can try Allegra 1 tab PO daily No indication for additional abx, steroids or repeat chest xray  Return precautions discussed Webb Silversmith, NP

## 2016-12-05 NOTE — Telephone Encounter (Signed)
I spoke to [place] and he scheduled patient's appointment for today at 2:00 with [facility].

## 2016-12-24 ENCOUNTER — Other Ambulatory Visit: Payer: Self-pay | Admitting: Family Medicine

## 2016-12-24 DIAGNOSIS — N289 Disorder of kidney and ureter, unspecified: Secondary | ICD-10-CM

## 2016-12-24 DIAGNOSIS — E538 Deficiency of other specified B group vitamins: Secondary | ICD-10-CM

## 2016-12-24 DIAGNOSIS — IMO0002 Reserved for concepts with insufficient information to code with codable children: Secondary | ICD-10-CM

## 2016-12-24 DIAGNOSIS — D509 Iron deficiency anemia, unspecified: Secondary | ICD-10-CM

## 2016-12-24 DIAGNOSIS — E1149 Type 2 diabetes mellitus with other diabetic neurological complication: Secondary | ICD-10-CM

## 2016-12-24 DIAGNOSIS — E1165 Type 2 diabetes mellitus with hyperglycemia: Secondary | ICD-10-CM

## 2016-12-24 DIAGNOSIS — E7800 Pure hypercholesterolemia, unspecified: Secondary | ICD-10-CM

## 2016-12-26 ENCOUNTER — Ambulatory Visit (INDEPENDENT_AMBULATORY_CARE_PROVIDER_SITE_OTHER): Payer: Medicare Other

## 2016-12-26 ENCOUNTER — Other Ambulatory Visit: Payer: Self-pay | Admitting: Family Medicine

## 2016-12-26 VITALS — BP 124/72 | HR 73 | Temp 97.5°F | Ht 62.5 in | Wt 144.8 lb

## 2016-12-26 DIAGNOSIS — E785 Hyperlipidemia, unspecified: Secondary | ICD-10-CM

## 2016-12-26 DIAGNOSIS — E1149 Type 2 diabetes mellitus with other diabetic neurological complication: Secondary | ICD-10-CM

## 2016-12-26 DIAGNOSIS — IMO0002 Reserved for concepts with insufficient information to code with codable children: Secondary | ICD-10-CM

## 2016-12-26 DIAGNOSIS — D509 Iron deficiency anemia, unspecified: Secondary | ICD-10-CM | POA: Diagnosis not present

## 2016-12-26 DIAGNOSIS — Z23 Encounter for immunization: Secondary | ICD-10-CM | POA: Diagnosis not present

## 2016-12-26 DIAGNOSIS — E1165 Type 2 diabetes mellitus with hyperglycemia: Secondary | ICD-10-CM | POA: Diagnosis not present

## 2016-12-26 DIAGNOSIS — N289 Disorder of kidney and ureter, unspecified: Secondary | ICD-10-CM | POA: Diagnosis not present

## 2016-12-26 DIAGNOSIS — E538 Deficiency of other specified B group vitamins: Secondary | ICD-10-CM

## 2016-12-26 DIAGNOSIS — Z0000 Encounter for general adult medical examination without abnormal findings: Secondary | ICD-10-CM

## 2016-12-26 DIAGNOSIS — E7800 Pure hypercholesterolemia, unspecified: Secondary | ICD-10-CM

## 2016-12-26 LAB — RENAL FUNCTION PANEL
Albumin: 4.2 g/dL (ref 3.5–5.2)
BUN: 22 mg/dL (ref 6–23)
CO2: 26 meq/L (ref 19–32)
CREATININE: 1.12 mg/dL (ref 0.40–1.20)
Calcium: 9.7 mg/dL (ref 8.4–10.5)
Chloride: 105 mEq/L (ref 96–112)
GFR: 50.58 mL/min — ABNORMAL LOW (ref >60.00)
Glucose, Bld: 202 mg/dL — ABNORMAL HIGH (ref 70–99)
Phosphorus: 3.9 mg/dL (ref 2.3–4.6)
Potassium: 5.2 mEq/L — ABNORMAL HIGH (ref 3.5–5.1)
Sodium: 138 mEq/L (ref 135–145)

## 2016-12-26 LAB — VITAMIN B12: Vitamin B-12: 414 pg/mL (ref 211–911)

## 2016-12-26 LAB — LIPID PANEL
CHOL/HDL RATIO: 3
Cholesterol: 123 mg/dL (ref 0–200)
HDL: 47.3 mg/dL (ref >39.00)
LDL CALC: 51 mg/dL (ref 0–99)
NonHDL: 75.33
TRIGLYCERIDES: 123 mg/dL (ref 0.0–149.0)
VLDL: 24.6 mg/dL (ref 0.0–40.0)

## 2016-12-26 LAB — IBC PANEL
IRON: 96 ug/dL (ref 42–145)
SATURATION RATIOS: 28.1 % (ref 20.0–50.0)
Transferrin: 244 mg/dL (ref 212.0–360.0)

## 2016-12-26 LAB — HEMOGLOBIN A1C: HEMOGLOBIN A1C: 8.6 % — AB (ref 4.6–6.5)

## 2016-12-26 LAB — FERRITIN: Ferritin: 15 ng/mL (ref 10.0–291.0)

## 2016-12-26 NOTE — Progress Notes (Signed)
PCP notes:   Health maintenance:  Foot exam - PCP please address at next appt PCV13 - administered Flu vaccine - administered Bone density - pt declined Mammogram - pt declined Tetanus vaccine - postponed/insurance Eye exam - addressed; pt plans to schedule future appt Colon cancer screening - FOBT kit provided to pt  Abnormal screenings:   Depression score: 8 Fall risk - hx of fall with injury but no medical treatment Hearing - failed  Hearing Screening   '125Hz'  '250Hz'  '500Hz'  '1000Hz'  '2000Hz'  '3000Hz'  '4000Hz'  '6000Hz'  '8000Hz'   Right ear:   0 0 40  40    Left ear:   40 40 40  40     Patient concerns:   None  Nurse concerns:  None  Next PCP appt:   01/02/17 @ 1130

## 2016-12-26 NOTE — Patient Instructions (Addendum)
Maria Fox , Thank you for taking time to come for your Medicare Wellness Visit. I appreciate your ongoing commitment to your health goals. Please review the following plan we discussed and let me know if I can assist you in the future.   These are the goals we discussed: Goals    . Increase physical activity          Starting 12/26/2016, I will continue to walk for 30 min daily.        This is a list of the screening recommended for you and due dates:  Health Maintenance  Topic Date Due  . Complete foot exam   01/02/2017*  . Eye exam for diabetics  12/26/2017*  . Stool Blood Test  12/26/2017*  . DTaP/Tdap/Td vaccine (1 - Tdap) 08/09/2023*  . Tetanus Vaccine  08/09/2023*  . Mammogram  12/26/2048*  . DEXA scan (bone density measurement)  12/26/2048*  . Hemoglobin A1C  02/25/2017  . Urine Protein Check  08/26/2017  . Flu Shot  Completed  .  Hepatitis C: One time screening is recommended by Center for Disease Control  (CDC) for  adults born from [birth year] through 1967.   Completed  . Pneumonia vaccines  Completed  *Topic was postponed. The date shown is not the original due date.   Preventive Care for Adults  A healthy lifestyle and preventive care can promote health and wellness. Preventive health guidelines for adults include the following key practices.  . A routine yearly physical is a good way to check with your health care provider about your health and preventive screening. It is a chance to share any concerns and updates on your health and to receive a thorough exam.  . Visit your dentist for a routine exam and preventive care every 6 months. Brush your teeth twice a day and floss once a day. Good oral hygiene prevents tooth decay and gum disease.  . The frequency of eye exams is based on your age, health, family medical history, use  of contact lenses, and other factors. Follow your health care provider's recommendations for frequency of eye exams.  . Eat a healthy diet. Foods  like vegetables, fruits, whole grains, low-fat dairy products, and lean protein foods contain the nutrients you need without too many calories. Decrease your intake of foods high in solid fats, added sugars, and salt. Eat the right amount of calories for you. Get information about a proper diet from your health care provider, if necessary.  . Regular physical exercise is one of the most important things you can do for your health. Most adults should get at least 150 minutes of moderate-intensity exercise (any activity that increases your heart rate and causes you to sweat) each week. In addition, most adults need muscle-strengthening exercises on 2 or more days a week.  Silver Sneakers may be a benefit available to you. To determine eligibility, you may visit the website: www.silversneakers.com or contact program at 334-042-6403 Mon-Fri between 8AM-8PM.   . Maintain a healthy weight. The body mass index (BMI) is a screening tool to identify possible weight problems. It provides an estimate of body fat based on height and weight. Your health care provider can find your BMI and can help you achieve or maintain a healthy weight.   For adults 20 years and older: ? A BMI below 18.5 is considered underweight. ? A BMI of 18.5 to 24.9 is normal. ? A BMI of 25 to 29.9 is considered overweight. ? A BMI of  30 and above is considered obese.   . Maintain normal blood lipids and cholesterol levels by exercising and minimizing your intake of saturated fat. Eat a balanced diet with plenty of fruit and vegetables. Blood tests for lipids and cholesterol should begin at age 32 and be repeated every 5 years. If your lipid or cholesterol levels are high, you are over 50, or you are at high risk for heart disease, you may need your cholesterol levels checked more frequently. Ongoing high lipid and cholesterol levels should be treated with medicines if diet and exercise are not working.  . If you smoke, find out from  your health care provider how to quit. If you do not use tobacco, please do not start.  . If you choose to drink alcohol, please do not consume more than 2 drinks per day. One drink is considered to be 12 ounces (355 mL) of beer, 5 ounces (148 mL) of wine, or 1.5 ounces (44 mL) of liquor.  . If you are 62-57 years old, ask your health care provider if you should take aspirin to prevent strokes.  . Use sunscreen. Apply sunscreen liberally and repeatedly throughout the day. You should seek shade when your shadow is shorter than you. Protect yourself by wearing long sleeves, pants, a wide-brimmed hat, and sunglasses year round, whenever you are outdoors.  . Once a month, do a whole body skin exam, using a mirror to look at the skin on your back. Tell your health care provider of new moles, moles that have irregular borders, moles that are larger than a pencil eraser, or moles that have changed in shape or color.

## 2016-12-26 NOTE — Progress Notes (Signed)
Pre visit review using our clinic review tool, if applicable. No additional management support is needed unless otherwise documented below in the visit note. 
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
## 2016-12-29 NOTE — Progress Notes (Signed)
I reviewed health advisor's note, was available for consultation, and agree with documentation and plan.  

## 2017-01-02 ENCOUNTER — Ambulatory Visit (INDEPENDENT_AMBULATORY_CARE_PROVIDER_SITE_OTHER): Payer: Medicare Other | Admitting: Family Medicine

## 2017-01-02 ENCOUNTER — Encounter: Payer: Self-pay | Admitting: Family Medicine

## 2017-01-02 VITALS — BP 126/84 | HR 77 | Temp 97.6°F | Ht 63.0 in | Wt 147.0 lb

## 2017-01-02 DIAGNOSIS — E1165 Type 2 diabetes mellitus with hyperglycemia: Secondary | ICD-10-CM

## 2017-01-02 DIAGNOSIS — I10 Essential (primary) hypertension: Secondary | ICD-10-CM | POA: Diagnosis not present

## 2017-01-02 DIAGNOSIS — Z7189 Other specified counseling: Secondary | ICD-10-CM | POA: Insufficient documentation

## 2017-01-02 DIAGNOSIS — Z1239 Encounter for other screening for malignant neoplasm of breast: Secondary | ICD-10-CM

## 2017-01-02 DIAGNOSIS — Z8673 Personal history of transient ischemic attack (TIA), and cerebral infarction without residual deficits: Secondary | ICD-10-CM

## 2017-01-02 DIAGNOSIS — L603 Nail dystrophy: Secondary | ICD-10-CM

## 2017-01-02 DIAGNOSIS — G4700 Insomnia, unspecified: Secondary | ICD-10-CM

## 2017-01-02 DIAGNOSIS — E1149 Type 2 diabetes mellitus with other diabetic neurological complication: Secondary | ICD-10-CM | POA: Diagnosis not present

## 2017-01-02 DIAGNOSIS — F333 Major depressive disorder, recurrent, severe with psychotic symptoms: Secondary | ICD-10-CM | POA: Diagnosis not present

## 2017-01-02 DIAGNOSIS — N289 Disorder of kidney and ureter, unspecified: Secondary | ICD-10-CM

## 2017-01-02 DIAGNOSIS — Z1231 Encounter for screening mammogram for malignant neoplasm of breast: Secondary | ICD-10-CM

## 2017-01-02 DIAGNOSIS — E7800 Pure hypercholesterolemia, unspecified: Secondary | ICD-10-CM | POA: Diagnosis not present

## 2017-01-02 DIAGNOSIS — I25119 Atherosclerotic heart disease of native coronary artery with unspecified angina pectoris: Secondary | ICD-10-CM

## 2017-01-02 DIAGNOSIS — IMO0002 Reserved for concepts with insufficient information to code with codable children: Secondary | ICD-10-CM

## 2017-01-02 DIAGNOSIS — E2839 Other primary ovarian failure: Secondary | ICD-10-CM

## 2017-01-02 MED ORDER — METFORMIN HCL 500 MG PO TABS: 500.0000 mg | ORAL_TABLET | Freq: Three times a day (TID) | ORAL | 6 refills | Status: DC

## 2017-01-02 NOTE — Addendum Note (Signed)
Addended by: [name] on: 01/02/2017 10:18 AM   Modules accepted: Orders

## 2017-01-02 NOTE — Assessment & Plan Note (Signed)
Chronic, stable. Continue statin. The ASCVD Risk score (Goff DC Jr., et al., 2013) failed to calculate for the following reasons:   The patient has a prior MI or stroke diagnosis  

## 2017-01-02 NOTE — Patient Instructions (Addendum)
I do recommend we see psychiatrist to help with depression and mood.  Return stool kit at our lab.  We will order mammogram for breast cancer screening and bone density scan - see Rosaria Ferries.  Advanced directive packet provided today.  Increase metformin to 563m three times daily with meals.  We will refer you to podiatry Return to see me in 4 months for diabetes follow up visit.  Schedule eye doctor as you're due.

## 2017-01-02 NOTE — Progress Notes (Signed)
BP 126/84 (BP Location: Left Arm, Patient Position: Sitting, Cuff Size: Normal)   Pulse 77   Temp 97.6 F (36.4 C) (Oral)   Ht 5\' 3"  (1.6 m)   Wt 147 lb (66.7 kg)   SpO2 98%   BMI 26.04 kg/m    CC: AMW f/u visit Subjective:    Patient ID: Maria Fox, female    DOB: 05/24/1945, 71 y.o.   MRN: [id number]  HPI: Maria Fox is a 71 y.o. female presenting on 01/02/2017 for Annual Exam (Pt 2)   Here alone today - [name] waiting out in the car.  Saw Maria Fox last week for medicare wellness visit. Note reviewed. Trouble R ear with hearing screen. 1 fall in last year with injury - slipped on slick oil spot. PHQ9 = 8, worthlessness feelings.   Notes muscle spasms at bedtime. She stays cold.   Last visit we referred to local psychiatrist - she did not schedule appointment. notes worsening depression with darker weather.   She is interested in looking into independent living on her own. She is not interested in case management assistance.   Preventative: Colon cancer screening - iFOB - will turn in today Lung cancer screening - not eligible Breast cancer screening - overdue. We will order mammogram today Well woman exam - desires to age out. Denies pelvic pain or vaginal bleeding DEXA scan - agrees to schedule Flu shot yearly Td 2005 prevnar 2018, presumed pneumovax 2012 shingrix - defer for now Advanced directive discussion - does not have set up. Would want Maria Fox or brother Maria Fox to be HCPOA. Packet provided today.  Seat belt use discussed Sunscreen use discussed, no changing moles on skin. 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 Interim medical history since  our last visit reviewed. Allergies and medications reviewed and updated. Outpatient Medications Prior to Visit  Medication Sig Dispense Refill  . albuterol (PROVENTIL HFA;VENTOLIN HFA) 108 (90 Base) MCG/ACT inhaler Inhale 2 puffs into the lungs every 4 (four) hours as needed for wheezing or shortness of breath. 1 Inhaler 0  . aspirin EC 81 MG tablet Take 81 mg by mouth daily.     Marland Kitchen atorvastatin (LIPITOR) 40 MG tablet Take 1 tablet (40 mg total) by mouth at bedtime. 90 tablet 3  . clopidogrel (PLAVIX) 75 MG tablet TAKE ONE TABLET BY MOUTH DAILY 90 tablet 0  . docusate sodium (COLACE) 100 MG capsule Take 100 mg by mouth daily as needed for mild constipation.    . gabapentin (NEURONTIN) 100 MG capsule Take one tablet in am, one in afternoon, two in evening 120 capsule 6  . metoprolol tartrate (LOPRESSOR) 25 MG tablet TAKE ONE-HALF TABLET BY MOUTH DAILY 45 tablet 1  . traZODone (DESYREL) 50 MG tablet Take 0.5-1 tablets (25-50 mg total) by mouth at bedtime as needed for sleep. (Patient taking differently: Take 50 mg by mouth at bedtime as needed for sleep. ) 30 tablet 6  . triamcinolone cream (KENALOG) 0.1 % Apply 1 application topically 2 (two) times daily. Apply to AA. 30 g 3  . metFORMIN (GLUCOPHAGE) 500 MG tablet TAKE ONE TABLET BY MOUTH TWICE A DAY WITH MEALS 60 tablet 3  . metoCLOPramide (REGLAN) 10  MG tablet Take 10 mg by mouth every 6 (six) hours as needed for nausea or vomiting.     No facility-administered medications prior to visit.      Per HPI unless specifically indicated in ROS section below Review of Systems     Objective:    BP 126/84 (BP Location: Left Arm, Patient Position: Sitting, Cuff Size: Normal)   Pulse 77   Temp 97.6 F (36.4 C) (Oral)   Ht 5\' 3"  (1.6 m)   Wt 147 lb (66.7 kg)   SpO2 98%   BMI 26.04 kg/m   Wt Readings from Last 3 Encounters:  01/02/17 147 lb (66.7 kg)  12/26/16 144 lb 12 oz (65.7 kg)  12/05/16 141 lb (64 kg)    Physical Exam    Constitutional: She is oriented to person, place, and time. She appears well-developed and well-nourished. No distress.  HENT:  Head: Normocephalic and atraumatic.  Right Ear: Hearing, tympanic membrane, external ear and ear canal normal.  Left Ear: Hearing, tympanic membrane, external ear and ear canal normal.  Nose: Nose normal.  Mouth/Throat: Uvula is midline, oropharynx is clear and moist and mucous membranes are normal. No oropharyngeal exudate, posterior oropharyngeal edema or posterior oropharyngeal erythema.  Eyes: Pupils are equal, round, and reactive to light. Conjunctivae and EOM are normal. No scleral icterus.  Neck: Normal range of motion. Neck supple. Carotid bruit is not present. No thyromegaly present.  Cardiovascular: Normal rate, regular rhythm, normal heart sounds and intact distal pulses.   No murmur heard. Pulses:      Radial pulses are 2+ on the right side, and 2+ on the left side.  Pulmonary/Chest: Effort normal and breath sounds normal. No respiratory distress. She has no wheezes. She has no rales.  Abdominal: Soft. Bowel sounds are normal. She exhibits no distension and no mass. There is no tenderness. There is no rebound and no guarding.  Musculoskeletal: Normal range of motion. She exhibits no edema.  Lymphadenopathy:    She has no cervical adenopathy.  Neurological: She is alert and oriented to person, place, and time.  CN grossly intact, station and gait intact  Skin: Skin is warm and dry. No rash noted.  Psychiatric: She has a normal mood and affect. Her behavior is normal. Judgment and thought content normal.  Nursing note and vitals reviewed.  Results for orders placed or performed in visit on 12/26/16  Renal function panel  Result Value Ref Range   Sodium 138 135 - 145 mEq/L   Potassium 5.2 (H) 3.5 - 5.1 mEq/L   Chloride 105 96 - 112 mEq/L   CO2 26 19 - 32 mEq/L   Calcium 9.7 8.4 - 10.5 mg/dL   Albumin 4.2 3.5 - 5.2 g/dL   BUN 22 6 - 23 mg/dL    Creatinine, Ser 1.12 0.40 - 1.20 mg/dL   Glucose, Bld 202 (H) 70 - 99 mg/dL   Phosphorus 3.9 2.3 - 4.6 mg/dL   GFR 50.58 (L) >60.00 mL/min  Lipid panel  Result Value Ref Range   Cholesterol 123 0 - 200 mg/dL   Triglycerides 123.0 0.0 - 149.0 mg/dL   HDL 47.30 >39.00 mg/dL   VLDL 24.6 0.0 - 40.0 mg/dL   LDL Cholesterol 51 0 - 99 mg/dL   Total CHOL/HDL Ratio 3    NonHDL 75.33   Vitamin B12  Result Value Ref Range   Vitamin B-12 414 211 - 911 pg/mL  Ferritin  Result Value Ref Range   Ferritin 15.0  10.0 - 291.0 ng/mL  IBC panel  Result Value Ref Range   Iron 96 42 - 145 ug/dL   Transferrin 244.0 212.0 - 360.0 mg/dL   Saturation Ratios 28.1 20.0 - 50.0 %  Hemoglobin A1c  Result Value Ref Range   Hgb A1c MFr Bld 8.6 (H) 4.6 - 6.5 %   Lab Results  Component Value Date   TSH 1.355 07/26/2015   Lab Results  Component Value Date   WBC 7.0 11/03/2016   HGB 12.8 11/03/2016   HCT 37.5 11/03/2016   MCV 87.6 11/03/2016   PLT 218 11/03/2016    Diabetic Foot Exam - Simple   Simple Foot Form Diabetic Foot exam was performed with the following findings:  Yes 01/02/2017  9:51 AM  Visual Inspection See comments:  Yes Sensation Testing Intact to touch and monofilament testing bilaterally:  Yes Pulse Check Posterior Tibialis and Dorsalis pulse intact bilaterally:  Yes Comments Thickened dystrophic great toes bilaterally        Assessment & Plan:   Problem List Items Addressed This Visit    Advanced care planning/counseling discussion    Advanced directive discussion - does not have set up. Would want Maria Fox or brother Maria Fox to be HCPOA. Packet provided today.       DM (diabetes mellitus), type 2, uncontrolled w/neurologic complication ([name])    D9R deteriorated - will increase metformin to 500mg  TID.  Foot exam today. Encouraged she schedule eye exam. Reviewed low sugar/carb diabetic diet.  rec she schedule eye exam as due.      Relevant Medications   metFORMIN  (GLUCOPHAGE) 500 MG tablet   History of cerebrovascular accident (CVA) in adulthood    Continue aspirin, plavix, statin.       HLD (hyperlipidemia)    Chronic, stable. Continue statin. The ASCVD Risk score Mikey Bussing DC Jr., et al., 2013) failed to calculate for the following reasons:   The patient has a prior MI or stroke diagnosis       Hypertension, essential    Chronic, stable. Continue low dose metoprolol once daily.       Insomnia    Ongoing despite trazodone 50mg  nightly. No changes at this time.       MDD (major depressive disorder), recurrent, severe, with psychosis ([place]) - Primary    Chronic, stable period. Complicated psychiatric history. She self stopped psychotropics. H/o beh health admissions over the past year. She did not f/u with psychiatrist. I again encouraged she schedule this appointment. Continue trazodone 50mg  nightly.       Renal insufficiency    Reviewed with patient, encouraged good hydration status, avoiding NSAIDs.        Other Visit Diagnoses    Breast cancer screening       Relevant Orders   MM Digital Screening   Estrogen deficiency       Relevant Orders   DG Bone Density       Follow up plan: Return in about 4 months (around 05/05/2017) for follow up visit.  [name], MD

## 2017-01-02 NOTE — Assessment & Plan Note (Signed)
Chronic, stable. Continue low dose metoprolol once daily.

## 2017-01-02 NOTE — Assessment & Plan Note (Signed)
A1c deteriorated - will increase metformin to 500mg  TID.  Foot exam today. Encouraged she schedule eye exam. Reviewed low sugar/carb diabetic diet.  rec she schedule eye exam as due.

## 2017-01-02 NOTE — Assessment & Plan Note (Signed)
Reviewed with patient, encouraged good hydration status, avoiding NSAIDs.

## 2017-01-02 NOTE — Assessment & Plan Note (Addendum)
Chronic, stable period. Complicated psychiatric history. She self stopped psychotropics. H/o beh health admissions over the past year. She did not f/u with psychiatrist. I again encouraged she schedule this appointment. Continue trazodone 50mg  nightly.

## 2017-01-02 NOTE — Assessment & Plan Note (Signed)
Advanced directive discussion - does not have set up. Would want [name] or brother [name] to be HCPOA. Packet provided today.

## 2017-01-02 NOTE — Assessment & Plan Note (Signed)
Ongoing despite trazodone 50mg  nightly. No changes at this time.

## 2017-01-02 NOTE — Assessment & Plan Note (Signed)
Continue aspirin ,plavix, statin.  

## 2017-01-07 ENCOUNTER — Other Ambulatory Visit (INDEPENDENT_AMBULATORY_CARE_PROVIDER_SITE_OTHER): Payer: Medicare Other

## 2017-01-07 DIAGNOSIS — R531 Weakness: Secondary | ICD-10-CM | POA: Diagnosis not present

## 2017-01-07 LAB — FECAL OCCULT BLOOD, IMMUNOCHEMICAL: FECAL OCCULT BLD: NEGATIVE

## 2017-01-13 ENCOUNTER — Encounter: Payer: Self-pay | Admitting: Podiatry

## 2017-01-13 ENCOUNTER — Ambulatory Visit (INDEPENDENT_AMBULATORY_CARE_PROVIDER_SITE_OTHER): Payer: Medicare Other | Admitting: Podiatry

## 2017-01-13 DIAGNOSIS — M79675 Pain in left toe(s): Secondary | ICD-10-CM

## 2017-01-13 DIAGNOSIS — I25119 Atherosclerotic heart disease of native coronary artery with unspecified angina pectoris: Secondary | ICD-10-CM

## 2017-01-13 DIAGNOSIS — D689 Coagulation defect, unspecified: Secondary | ICD-10-CM

## 2017-01-13 DIAGNOSIS — M79674 Pain in right toe(s): Secondary | ICD-10-CM

## 2017-01-13 DIAGNOSIS — B351 Tinea unguium: Secondary | ICD-10-CM
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
## 2017-01-15 ENCOUNTER — Telehealth: Payer: Self-pay | Admitting: Family Medicine

## 2017-01-15 NOTE — Telephone Encounter (Signed)
Pt. called to ask if Dr. [name] would order an alternative medication to Gapabentin.  Reported she discussed this with him at the last office visit.  Reported she is having increased jumping of arms and legs at night, and head twitching.  Advised will send message to the office to make Dr. [name] aware of pt's. request.

## 2017-01-16 NOTE — Telephone Encounter (Signed)
Dr. Veronia Beets mentioned if not sleeping better with higher gabapentin dose, restart trazodone 25-50mg  nightly as needed for sleep.  If she hasn't done that, then proceed with trazodone.  If she has restarted trazodone, then I'll need to defer to Dr. [name] when he returns to office.  Thanks.

## 2017-01-17 ENCOUNTER — Telehealth: Payer: Self-pay | Admitting: Family Medicine

## 2017-01-17 NOTE — Telephone Encounter (Signed)
Copied from [place] #[id number]. Topic: General - Other >> Jan 17, 2017  2:55 PM Neva Seat wrote:  Pt missed a call from practice.  Was returning the call.  Pt says [name] can help her with any medical messages from the practice.

## 2017-01-17 NOTE — Telephone Encounter (Signed)
I have tried to phone the patient 3 times this morning, twice her husband answered and she was not available and once there was no answer and no VM.  Note routed to [place].

## 2017-01-17 NOTE — Telephone Encounter (Signed)
Patient says she has been taking the higher dose of Gabapentin and the Trazadone but is still having problems with her legs and arms jumping.  Patient says she thought Dr. [name] spoke of a new medication that she could try if this didn't work.

## 2017-01-17 NOTE — Telephone Encounter (Signed)
Pt returned call, was checking with flow coordinator, and call got disconnected.  Tried to call pt back, but got voicemail

## 2017-01-20 MED ORDER — GABAPENTIN 100 MG PO CAPS: ORAL_CAPSULE | ORAL | 6 refills | Status: DC

## 2017-01-20 NOTE — Telephone Encounter (Signed)
When does she notice these movements (just night time or also during the day?) and what exactly does she experience? Recommend we continue increasing gabapentin to 1 tab morning and afternoon, 3 in evening.  Recommend we back off caffeine if she drinks this, recommend she back off reglan (metoclopromide) for nausea as this can be associated with involuntary movements.  Recommend good hydration status as this can sometimes be a sign of dehydration. Let us know if not better with this.

## 2017-01-20 NOTE — Telephone Encounter (Signed)
Spoke with pt and asked when she is having the movements and exactly what is happening.  States she has them at night and sometimes the evenings. But says it is a "jumping" motion.   I relayed instructions and message per Dr. [name].  Pt says ok and she will let Dr. [name] know if this works.

## 2017-01-20 NOTE — Telephone Encounter (Signed)
See TE, 01/17/2017.  [Pt has been provided with instructions per Dr. G.]

## 2017-01-28 ENCOUNTER — Other Ambulatory Visit: Payer: Self-pay | Admitting: Family Medicine

## 2017-01-28 DIAGNOSIS — R000 Tachycardia, unspecified: Secondary | ICD-10-CM

## 2017-01-29 ENCOUNTER — Ambulatory Visit
Admission: RE | Admit: 2017-01-29 | Discharge: 2017-01-29 | Disposition: A | Discharge status: Home / Self Care | Payer: Medicare Other | Source: Ambulatory Visit | Attending: Family Medicine | Admitting: Family Medicine

## 2017-01-29 DIAGNOSIS — M85851 Other specified disorders of bone density and structure, right thigh: Secondary | ICD-10-CM | POA: Diagnosis not present

## 2017-01-29 DIAGNOSIS — Z1239 Encounter for other screening for malignant neoplasm of breast: Secondary | ICD-10-CM

## 2017-01-29 DIAGNOSIS — E2839 Other primary ovarian failure: Secondary | ICD-10-CM

## 2017-01-29 DIAGNOSIS — Z1231 Encounter for screening mammogram for malignant neoplasm of breast: Secondary | ICD-10-CM | POA: Diagnosis not present

## 2017-01-29 DIAGNOSIS — Z780 Asymptomatic menopausal state: Secondary | ICD-10-CM | POA: Diagnosis not present

## 2017-02-01 ENCOUNTER — Encounter: Payer: Self-pay | Admitting: Family Medicine

## 2017-02-01 DIAGNOSIS — M8580 Other specified disorders of bone density and structure, unspecified site: Secondary | ICD-10-CM | POA: Insufficient documentation

## 2017-02-03 ENCOUNTER — Other Ambulatory Visit: Payer: Self-pay | Admitting: Family Medicine

## 2017-02-03 DIAGNOSIS — R928 Other abnormal and inconclusive findings on diagnostic imaging of breast: Secondary | ICD-10-CM

## 2017-02-04 ENCOUNTER — Ambulatory Visit (INDEPENDENT_AMBULATORY_CARE_PROVIDER_SITE_OTHER): Payer: Medicare Other | Admitting: Podiatry

## 2017-02-04 DIAGNOSIS — I25119 Atherosclerotic heart disease of native coronary artery with unspecified angina pectoris: Secondary | ICD-10-CM

## 2017-02-04 DIAGNOSIS — L603 Nail dystrophy: Secondary | ICD-10-CM | POA: Diagnosis not present
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
## 2017-02-06 ENCOUNTER — Ambulatory Visit
Admission: RE | Admit: 2017-02-06 | Discharge: 2017-02-06 | Disposition: A | Discharge status: Home / Self Care | Payer: Medicare Other | Source: Ambulatory Visit | Attending: Family Medicine | Admitting: Family Medicine

## 2017-02-06 DIAGNOSIS — R928 Other abnormal and inconclusive findings on diagnostic imaging of breast: Secondary | ICD-10-CM

## 2017-02-06 DIAGNOSIS — R922 Inconclusive mammogram: Secondary | ICD-10-CM | POA: Diagnosis not present

## 2017-02-06 DIAGNOSIS — N6489 Other specified disorders of breast: Secondary | ICD-10-CM | POA: Diagnosis not present

## 2017-02-06 NOTE — Progress Notes (Signed)
Subjective: Maria Fox presents the office today for concerns of both of her big toenails becoming thick and painful.  She did get relief after trim them last appointment but there is still thick and discolored and she would discuss removal of the nails.  She denies any redness or drainage or any swelling of her toenails.  She states that it only hurt with pressure in shoes at times and is intermittent. Denies any systemic complaints such as fevers, chills, nausea, vomiting. No acute changes since last appointment, and no other complaints at this time.   Objective: AAO x3, NAD DP/PT pulses palpable bilaterally, CRT less than 3 seconds Bilateral hallux nails are very dystrophic, discolored and hypertrophic.  There is no tenderness palpation of the nail today and there is no surrounding redness or drainage or any signs of infection present.  There is no edema around the area. No open lesions or pre-ulcerative lesions.  No pain with calf compression, swelling, warmth, erythema  Assessment: Onychodystrophy bilateral hallux toenails  Plan: -All treatment options discussed with the patient including all alternatives, risks, complications.  -We discussed conservative as well as surgical options. Given the fact that there is no signs of infection there is no tenderness today and she has an elevated A1c I would recommend holding off on removal of the nails due to potential complications without having any symptoms.  I debrided the nails and also discussed urea cream to help thin the nails.  Should it become infected or more painful we can reconsider removal. -Patient encouraged to call the office with any questions, concerns, change in symptoms.   Trula Slade DPM

## 2017-03-14 ENCOUNTER — Ambulatory Visit: Payer: Self-pay | Admitting: Family Medicine

## 2017-03-18 ENCOUNTER — Ambulatory Visit: Payer: Self-pay | Admitting: Family Medicine

## 2017-03-18 DIAGNOSIS — Z0289 Encounter for other administrative examinations: Secondary | ICD-10-CM

## 2017-03-25 ENCOUNTER — Encounter: Payer: Self-pay | Admitting: Family Medicine

## 2017-03-25 ENCOUNTER — Telehealth: Payer: Self-pay | Admitting: Family Medicine

## 2017-03-25 ENCOUNTER — Other Ambulatory Visit: Payer: Self-pay | Admitting: Family Medicine

## 2017-03-25 ENCOUNTER — Ambulatory Visit (INDEPENDENT_AMBULATORY_CARE_PROVIDER_SITE_OTHER): Payer: Medicare HMO | Admitting: Family Medicine

## 2017-03-25 VITALS — BP 120/80 | HR 65 | Temp 97.9°F | Wt 148.0 lb

## 2017-03-25 DIAGNOSIS — R69 Illness, unspecified: Secondary | ICD-10-CM | POA: Diagnosis not present

## 2017-03-25 DIAGNOSIS — F333 Major depressive disorder, recurrent, severe with psychotic symptoms: Secondary | ICD-10-CM

## 2017-03-25 DIAGNOSIS — IMO0002 Reserved for concepts with insufficient information to code with codable children: Secondary | ICD-10-CM

## 2017-03-25 DIAGNOSIS — G4700 Insomnia, unspecified: Secondary | ICD-10-CM | POA: Diagnosis not present

## 2017-03-25 DIAGNOSIS — R258 Other abnormal involuntary movements: Secondary | ICD-10-CM

## 2017-03-25 DIAGNOSIS — E1165 Type 2 diabetes mellitus with hyperglycemia: Secondary | ICD-10-CM | POA: Diagnosis not present

## 2017-03-25 DIAGNOSIS — E1149 Type 2 diabetes mellitus with other diabetic neurological complication: Secondary | ICD-10-CM | POA: Diagnosis not present

## 2017-03-25 DIAGNOSIS — E785 Hyperlipidemia, unspecified: Secondary | ICD-10-CM

## 2017-03-25 DIAGNOSIS — L720 Epidermal cyst: Secondary | ICD-10-CM | POA: Diagnosis not present

## 2017-03-25 DIAGNOSIS — R05 Cough: Secondary | ICD-10-CM

## 2017-03-25 DIAGNOSIS — D509 Iron deficiency anemia, unspecified: Secondary | ICD-10-CM

## 2017-03-25 DIAGNOSIS — R059 Cough, unspecified: Secondary | ICD-10-CM

## 2017-03-25 HISTORY — DX: Epidermal cyst: L72.0

## 2017-03-25 MED ORDER — TRAZODONE HCL 50 MG PO TABS: 75.0000 mg | ORAL_TABLET | Freq: Every evening | ORAL | 6 refills | Status: DC | PRN

## 2017-03-25 MED ORDER — FERROUS SULFATE 325 (65 FE) MG PO TABS: 325.0000 mg | ORAL_TABLET | Freq: Every day | ORAL | Status: DC

## 2017-03-25 NOTE — Assessment & Plan Note (Signed)
Describes night time symptoms that keep her awake. She does endorse feeling of "need to move legs" as well as "legs jumping". ?RLS - in h/o iron deficiency, will first trial ferrous sulfate replacement. Reassess at f/u visit next month.

## 2017-03-25 NOTE — Assessment & Plan Note (Signed)
Chronic, ongoing. Increase trazodone to 75mg  nightly. Reassess next month.

## 2017-03-25 NOTE — Assessment & Plan Note (Signed)
Exam most consistent with this esp with h/o prior drainage. Non inflamed today. No need for acute intervention. Pt desires definitive management - will refer to derm for eval.

## 2017-03-25 NOTE — Patient Instructions (Addendum)
Let front office know about last week's appointment.  We will refer you to dermatologist for neck cyst - see Rosaria Ferries for this.  Lungs sound clear today.  For legs jumping - restart iron tablet over the counter daily (ferrous sulfate 325mg  (65FE)). Take daily for 1 month and if helping leg symptoms, continue. If nnot helping, let me know and we will trial a different medicine.  Increase trazodone to 75mg  nightly (1.5 tablets).  Keep follow up appointment in February.

## 2017-03-25 NOTE — Progress Notes (Signed)
BP 120/80 (BP Location: Left Arm, Patient Position: Sitting, Cuff Size: Normal)   Pulse 65   Temp 97.9 F (36.6 C) (Oral)   Wt 148 lb (67.1 kg)   SpO2 98%   BMI 26.22 kg/m    CC: chronic cough, L neck lump, legs jumping Subjective:    Patient ID: Maria Fox, female    DOB: [birth year], 72 y.o.   MRN: [id number]  HPI: Maria Fox is a 72 y.o. female presenting on 03/25/2017 for Cough (Sometimes productive. Has been off and on since 12/2017. Worse in the mornings and when outside. ); Bump (Located on left posterior neck. Causing pain to radiate down into right shoulder); and Restless legs (Wants to discuss alternative to gabapentin for her legs "jumping".  Not working)   Missed appt last week. Here alone today.  Ongoing nonproductive cough for last several months. Worse with cold weather and first thing in the morning. No fevers/chills, congestion, ST, PNDrainage. Partner [name] also has a cough.   R neck mass present for years. She has pushed white foul discharge out of it in the past. She describes radiation of pain. Painful to lay on that side. Wants definitive management for this.   Arms and legs jump at night ongoing for months - no improvement on gabapentin. Affects ability to sleep. Ongoing trouble with insomnia despite trazodone 50mg  nightly. She feels like she has to move legs.   Has not established with psychiatrist.   DM - taking metformin 500mg  tid.  Lab Results  Component Value Date   HGBA1C 8.6 (H) 12/26/2016    Relevant past medical, surgical, family and social history reviewed and updated as indicated. Interim medical history since our last visit reviewed. Allergies and medications reviewed and updated. Outpatient Medications Prior to Visit  Medication Sig Dispense Refill  . albuterol (PROVENTIL HFA;VENTOLIN HFA) 108 (90 Base) MCG/ACT inhaler Inhale 2 puffs into the lungs every 4 (four) hours as needed for wheezing or shortness of breath. 1 Inhaler 0  . aspirin  EC 81 MG tablet Take 81 mg by mouth daily.     Marland Kitchen atorvastatin (LIPITOR) 40 MG tablet Take 1 tablet (40 mg total) by mouth at bedtime. 90 tablet 3  . clopidogrel (PLAVIX) 75 MG tablet TAKE ONE TABLET BY MOUTH DAILY 90 tablet 0  . docusate sodium (COLACE) 100 MG capsule Take 100 mg by mouth daily as needed for mild constipation.    . gabapentin (NEURONTIN) 100 MG capsule Take one tablet in am, one in afternoon, three in evening 150 capsule 6  . metFORMIN (GLUCOPHAGE) 500 MG tablet Take 1 tablet (500 mg total) by mouth 3 (three) times daily with meals. 90 tablet 6  . metoCLOPramide (REGLAN) 10 MG tablet Take 10 mg by mouth every 6 (six) hours as needed for nausea or vomiting.    . metoprolol tartrate (LOPRESSOR) 25 MG tablet TAKE ONE-HALF TABLET BY MOUTH DAILY 15 tablet 3  . triamcinolone cream (KENALOG) 0.1 % Apply 1 application topically 2 (two) times daily. Apply to AA. 30 g 3  . traZODone (DESYREL) 50 MG tablet Take 0.5-1 tablets (25-50 mg total) by mouth at bedtime as needed for sleep. (Patient taking differently: Take 50 mg by mouth at bedtime as needed for sleep. ) 30 tablet 6   No facility-administered medications prior to visit.      Per HPI unless specifically indicated in ROS section below Review of Systems     Objective:    BP 120/80 (  BP Location: Left Arm, Patient Position: Sitting, Cuff Size: Normal)   Pulse 65   Temp 97.9 F (36.6 C) (Oral)   Wt 148 lb (67.1 kg)   SpO2 98%   BMI 26.22 kg/m   Wt Readings from Last 3 Encounters:  03/25/17 148 lb (67.1 kg)  01/02/17 147 lb (66.7 kg)  12/26/16 144 lb 12 oz (65.7 kg)    Physical Exam  Constitutional: She appears well-developed and well-nourished. No distress.  HENT:  Mouth/Throat: Oropharynx is clear and moist. No oropharyngeal exudate.  Cardiovascular: Normal rate, regular rhythm, normal heart sounds and intact distal pulses.  No murmur heard. Pulmonary/Chest: Effort normal and breath sounds normal. No respiratory  distress. She has no wheezes. She has no rales.  Musculoskeletal: She exhibits no edema.  Skin: Skin is warm and dry.  R posterior neck with non inflamed nontender lump   Psychiatric: She has a normal mood and affect.  Nursing note and vitals reviewed.  Results for orders placed or performed in visit on 01/07/17  Fecal occult blood, imunochemical  Result Value Ref Range   Fecal Occult Bld Negative Negative   Lab Results  Component Value Date   IRON 96 12/26/2016   TIBC 252 07/09/2015   FERRITIN 15.0 12/26/2016       Assessment & Plan:   Problem List Items Addressed This Visit    Anemia, iron deficiency    Iron deficiency without anemia. I recommended she restart ferrous sulfate daily. Will reassess effect on leg symptoms (?RLS) after 1 month of treatment       Relevant Medications   ferrous sulfate 325 (65 FE) MG tablet   Cough    Benign exam, lungs clear today. Continue to monitor.       DM (diabetes mellitus), type 2, uncontrolled w/neurologic complication ([name])    Chronic. Compliant with metformin 500mg  TID. Update labs at next month's f/u.       Epidermal cyst of neck    Exam most consistent with this esp with h/o prior drainage. Non inflamed today. No need for acute intervention. Pt desires definitive management - will refer to derm for eval.       Relevant Orders   Ambulatory referral to Dermatology   Insomnia    Chronic, ongoing. Increase trazodone to 75mg  nightly. Reassess next month.      MDD (major depressive disorder), recurrent, severe, with psychosis ([place])    Chronic, stable period. H/o [facility] admissions 2018. Only on trazodone 50mg  nightly - see below. Continue to encourage psych f/u.       Relevant Medications   traZODone (DESYREL) 50 MG tablet   Nocturnal leg movements - Primary    Describes night time symptoms that keep her awake. She does endorse feeling of "need to move legs" as well as "legs jumping". ?RLS - in h/o iron deficiency, will  first trial ferrous sulfate replacement. Reassess at f/u visit next month.           Follow up plan: Return if symptoms worsen or fail to improve.  [name], MD

## 2017-03-25 NOTE — Assessment & Plan Note (Signed)
Chronic, stable period. H/o [facility] admissions 2018. Only on trazodone 50mg  nightly - see below. Continue to encourage psych f/u.

## 2017-03-25 NOTE — Telephone Encounter (Signed)
Patient said she called about her appointment on 03/18/17 and was told she didn't have an appointment.  She was given a no show for the appointment.  Dr.G told her to speak to someone at the front about the appointment.  Please call patient.

## 2017-03-25 NOTE — Assessment & Plan Note (Signed)
Chronic. Compliant with metformin 500mg  TID. Update labs at next month's f/u.

## 2017-03-25 NOTE — Assessment & Plan Note (Signed)
Iron deficiency without anemia. I recommended she restart ferrous sulfate daily. Will reassess effect on leg symptoms (?RLS) after 1 month of treatment

## 2017-03-25 NOTE — Assessment & Plan Note (Signed)
Benign exam, lungs clear today. Continue to monitor.

## 2017-04-02 DIAGNOSIS — L720 Epidermal cyst: Secondary | ICD-10-CM | POA: Diagnosis not present

## 2017-04-02 NOTE — Telephone Encounter (Signed)
I called and left msg for pt. I updated her 03/18/17 appt to canceled and told her to call with any questions.

## 2017-04-09 DIAGNOSIS — L720 Epidermal cyst: Secondary | ICD-10-CM | POA: Diagnosis not present

## 2017-04-16 ENCOUNTER — Ambulatory Visit: Payer: Medicare Other | Admitting: Podiatry

## 2017-04-17 ENCOUNTER — Ambulatory Visit: Payer: Medicare Other | Admitting: Podiatry

## 2017-04-21 ENCOUNTER — Emergency Department (HOSPITAL_COMMUNITY)
Admission: EM | Admit: 2017-04-21 | Discharge: 2017-04-22 | Disposition: A | Discharge status: Home / Self Care | Payer: Medicare HMO | Attending: Emergency Medicine | Admitting: Emergency Medicine

## 2017-04-21 ENCOUNTER — Encounter (HOSPITAL_COMMUNITY): Payer: Self-pay

## 2017-04-21 ENCOUNTER — Emergency Department (HOSPITAL_COMMUNITY): Payer: Medicare HMO

## 2017-04-21 DIAGNOSIS — I252 Old myocardial infarction: Secondary | ICD-10-CM | POA: Diagnosis not present

## 2017-04-21 DIAGNOSIS — R531 Weakness: Secondary | ICD-10-CM | POA: Diagnosis not present

## 2017-04-21 DIAGNOSIS — I2510 Atherosclerotic heart disease of native coronary artery without angina pectoris: Secondary | ICD-10-CM | POA: Insufficient documentation

## 2017-04-21 DIAGNOSIS — I509 Heart failure, unspecified: Secondary | ICD-10-CM | POA: Insufficient documentation

## 2017-04-21 DIAGNOSIS — E119 Type 2 diabetes mellitus without complications: Secondary | ICD-10-CM | POA: Diagnosis not present

## 2017-04-21 DIAGNOSIS — R1084 Generalized abdominal pain: Secondary | ICD-10-CM

## 2017-04-21 DIAGNOSIS — R197 Diarrhea, unspecified: Secondary | ICD-10-CM | POA: Diagnosis not present

## 2017-04-21 DIAGNOSIS — K5730 Diverticulosis of large intestine without perforation or abscess without bleeding: Secondary | ICD-10-CM | POA: Diagnosis not present

## 2017-04-21 DIAGNOSIS — R112 Nausea with vomiting, unspecified: Secondary | ICD-10-CM | POA: Diagnosis not present

## 2017-04-21 DIAGNOSIS — Z7984 Long term (current) use of oral hypoglycemic drugs: Secondary | ICD-10-CM | POA: Diagnosis not present

## 2017-04-21 DIAGNOSIS — Z79899 Other long term (current) drug therapy: Secondary | ICD-10-CM | POA: Diagnosis not present

## 2017-04-21 DIAGNOSIS — K8020 Calculus of gallbladder without cholecystitis without obstruction: Secondary | ICD-10-CM | POA: Diagnosis not present

## 2017-04-21 DIAGNOSIS — I10 Essential (primary) hypertension: Secondary | ICD-10-CM | POA: Diagnosis not present

## 2017-04-21 LAB — COMPREHENSIVE METABOLIC PANEL
ALBUMIN: 3.6 g/dL (ref 3.5–5.0)
ALK PHOS: 93 U/L (ref 38–126)
ALT: 21 U/L (ref 14–54)
AST: 24 U/L (ref 15–41)
Anion gap: 10 (ref 5–15)
BILIRUBIN TOTAL: 0.6 mg/dL (ref 0.3–1.2)
BUN: 23 mg/dL — ABNORMAL HIGH (ref 6–20)
CALCIUM: 9 mg/dL (ref 8.9–10.3)
CO2: 22 mmol/L (ref 22–32)
Chloride: 103 mmol/L (ref 101–111)
Creatinine, Ser: 1.17 mg/dL — ABNORMAL HIGH (ref 0.44–1.00)
GFR calc Af Amer: 52 mL/min — ABNORMAL LOW (ref >60)
GFR calc non Af Amer: 45 mL/min — ABNORMAL LOW (ref >60)
GLUCOSE: 316 mg/dL — AB (ref 65–99)
POTASSIUM: 4.1 mmol/L (ref 3.5–5.1)
Sodium: 135 mmol/L (ref 135–145)
TOTAL PROTEIN: 6.7 g/dL (ref 6.5–8.1)

## 2017-04-21 LAB — URINALYSIS, ROUTINE W REFLEX MICROSCOPIC
Bilirubin Urine: NEGATIVE
Glucose, UA: 150 mg/dL — AB
Hgb urine dipstick: NEGATIVE
KETONES UR: NEGATIVE mg/dL
Leukocytes, UA: NEGATIVE
NITRITE: NEGATIVE
PH: 5 (ref 5.0–8.0)
Protein, ur: NEGATIVE mg/dL
Specific Gravity, Urine: 1.025 (ref 1.005–1.030)

## 2017-04-21 LAB — CBC
HEMATOCRIT: 36.1 % (ref 36.0–46.0)
Hemoglobin: 12.3 g/dL (ref 12.0–15.0)
MCH: 30.7 pg (ref 26.0–34.0)
MCHC: 34.1 g/dL (ref 30.0–36.0)
MCV: 90 fL (ref 78.0–100.0)
Platelets: 203 10*3/uL (ref 150–400)
RBC: 4.01 MIL/uL (ref 3.87–5.11)
RDW: 13.1 % (ref 11.5–15.5)
WBC: 4.3 10*3/uL (ref 4.0–10.5)

## 2017-04-21 LAB — LIPASE, BLOOD: Lipase: 36 U/L (ref 11–51)

## 2017-04-21 MED ORDER — DICYCLOMINE HCL 10 MG PO CAPS
10.0000 mg | ORAL_CAPSULE | Freq: Once | ORAL | Status: AC
  Administered 2017-04-21: 10 mg via ORAL
  Filled 2017-04-21: qty 1

## 2017-04-21 MED ORDER — ONDANSETRON HCL 4 MG/2ML IJ SOLN
4.0000 mg | Freq: Once | INTRAMUSCULAR | Status: AC
  Administered 2017-04-21: 4 mg via INTRAVENOUS
  Filled 2017-04-21: qty 2

## 2017-04-21 MED ORDER — GI COCKTAIL ~~LOC~~
30.0000 mL | Freq: Once | ORAL | Status: AC
  Administered 2017-04-21: 30 mL via ORAL
  Filled 2017-04-21: qty 30

## 2017-04-21 MED ORDER — SODIUM CHLORIDE 0.9 % IV BOLUS (SEPSIS): 1000.0000 mL | Freq: Once | INTRAVENOUS | Status: DC

## 2017-04-21 MED ORDER — SODIUM CHLORIDE 0.9 % IV BOLUS (SEPSIS)
1000.0000 mL | Freq: Once | INTRAVENOUS | Status: AC
  Administered 2017-04-21: 1000 mL via INTRAVENOUS

## 2017-04-21 MED ORDER — IOPAMIDOL (ISOVUE-300) INJECTION 61%
100.0000 mL | Freq: Once | INTRAVENOUS | Status: AC | PRN
  Administered 2017-04-21: 100 mL via INTRAVENOUS
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
## 2017-04-21 NOTE — Discharge Instructions (Addendum)
I suspect that your symptoms are partly due to your iron supplements.  Given that your blood level looked okay here, it is okay to stop these and call your primary doctor for further discussion.  For as needed pain and discomfort in your upper abdomen, I recommend taking Protonix as prescribed.  You may also use Tums or Maalox as needed, as instructed on the box.  Try to avoid Alka-Seltzer or anything with aspirin in it.  Take the prescribed medication first thing in the morning, 30 minutes before eating.

## 2017-04-21 NOTE — ED Triage Notes (Signed)
Pt complains of abdominal pain and diarrhea for three days, she started taking an iron pill last week and it's made her stools dark and her stomach hurt Pt also says she's nauseated and has vomited a few times

## 2017-04-21 NOTE — ED Notes (Signed)
Bed: [facility]1 Expected date:  Expected time:  Means of arrival:  Comments: 

## 2017-04-22 MED ORDER — PANTOPRAZOLE SODIUM 20 MG PO TBEC: 20.0000 mg | DELAYED_RELEASE_TABLET | Freq: Every day | ORAL | 0 refills | Status: DC

## 2017-04-22 NOTE — ED Provider Notes (Signed)
2:16 AM Plan discussed with Kirichenko, PA-C at change of shift which includes discharge if able to tolerate oral fluids and urinalysis without evidence of infection.  Remainder of workup today has been reassuring.  The patient has been reassessed.  She appears comfortable.  She denies any current complaints and has been able to tolerate oral fluids without vomiting.  Have discussed discontinuation of oral iron supplements.  Will start on Protonix.  Primary care follow-up advised with return if symptoms worsen.  Patient discharged in stable condition with no unaddressed concerns.  Results for orders placed or performed during the hospital encounter of 04/21/17  Lipase, blood  Result Value Ref Range   Lipase 36 11 - 51 U/L  Comprehensive metabolic panel  Result Value Ref Range   Sodium 135 135 - 145 mmol/L   Potassium 4.1 3.5 - 5.1 mmol/L   Chloride 103 101 - 111 mmol/L   CO2 22 22 - 32 mmol/L   Glucose, Bld 316 (H) 65 - 99 mg/dL   BUN 23 (H) 6 - 20 mg/dL   Creatinine, Ser 1.17 (H) 0.44 - 1.00 mg/dL   Calcium 9.0 8.9 - 10.3 mg/dL   Total Protein 6.7 6.5 - 8.1 g/dL   Albumin 3.6 3.5 - 5.0 g/dL   AST 24 15 - 41 U/L   ALT 21 14 - 54 U/L   Alkaline Phosphatase 93 38 - 126 U/L   Total Bilirubin 0.6 0.3 - 1.2 mg/dL   GFR calc non Af Amer 45 (L) >60 mL/min   GFR calc Af Amer 52 (L) >60 mL/min   Anion gap 10 5 - 15  CBC  Result Value Ref Range   WBC 4.3 4.0 - 10.5 K/uL   RBC 4.01 3.87 - 5.11 MIL/uL   Hemoglobin 12.3 12.0 - 15.0 g/dL   HCT 36.1 36.0 - 46.0 %   MCV 90.0 78.0 - 100.0 fL   MCH 30.7 26.0 - 34.0 pg   MCHC 34.1 30.0 - 36.0 g/dL   RDW 13.1 11.5 - 15.5 %   Platelets 203 150 - 400 K/uL  Urinalysis, Routine w reflex microscopic  Result Value Ref Range   Color, Urine STRAW (A) YELLOW   APPearance CLEAR CLEAR   Specific Gravity, Urine 1.025 1.005 - 1.030   pH 5.0 5.0 - 8.0   Glucose, UA 150 (A) NEGATIVE mg/dL   Hgb urine dipstick NEGATIVE NEGATIVE   Bilirubin Urine NEGATIVE  NEGATIVE   Ketones, ur NEGATIVE NEGATIVE mg/dL   Protein, ur NEGATIVE NEGATIVE mg/dL   Nitrite NEGATIVE NEGATIVE   Leukocytes, UA NEGATIVE NEGATIVE   Ct Abdomen Pelvis W Contrast  Result Date: 04/21/2017 CLINICAL DATA:  Acute onset of generalized abdominal pain and diarrhea. Nausea and vomiting. EXAM: CT ABDOMEN AND PELVIS WITH CONTRAST TECHNIQUE: Multidetector CT imaging of the abdomen and pelvis was performed using the standard protocol following bolus administration of intravenous contrast. CONTRAST:  151mL ISOVUE-300 IOPAMIDOL (ISOVUE-300) INJECTION 61% COMPARISON:  CT of the abdomen and pelvis from 07/22/2009 FINDINGS: Lower chest: Minimal bibasilar atelectasis or scarring is noted. Mild postoperative change is noted about the mediastinum. The patient is status post median sternotomy. Hepatobiliary: The liver is unremarkable in appearance. Stones are seen dependently within the gallbladder. The gallbladder is otherwise unremarkable. The common bile duct remains normal in caliber. Pancreas: The pancreas is within normal limits. Spleen: Scattered calcified granulomata are seen within the spleen. Adrenals/Urinary Tract: The adrenal glands are unremarkable in appearance. Mild nonspecific perinephric stranding is noted bilaterally.  There is no evidence of hydronephrosis. No renal or ureteral stones are identified. Stomach/Bowel: The stomach is unremarkable in appearance. The small bowel is within normal limits. The appendix is normal in caliber, without evidence of appendicitis. Mild diverticulosis is noted along the proximal sigmoid colon, without evidence of diverticulitis. Vascular/Lymphatic: Scattered calcification is seen along the abdominal aorta and its branches. The abdominal aorta is otherwise grossly unremarkable. The inferior vena cava is grossly unremarkable. No retroperitoneal lymphadenopathy is seen. No pelvic sidewall lymphadenopathy is identified. Reproductive: The bladder is mildly distended  and grossly unremarkable. The uterus is unremarkable in appearance. The ovaries are grossly symmetric. No suspicious adnexal masses are seen. Other: No additional soft tissue abnormalities are seen. Musculoskeletal: No acute osseous abnormalities are identified. The visualized musculature is unremarkable in appearance. IMPRESSION: 1. No acute abnormality seen within the abdomen or pelvis. 2. Cholelithiasis.  Gallbladder otherwise unremarkable. 3. Mild diverticulosis along the proximal sigmoid colon, without evidence of diverticulitis. Aortic Atherosclerosis (ICD10-I70.0). Electronically Signed   By: Garald Balding M.D.   On: 04/21/2017 22:51      Antonietta Breach, PA-C 04/22/17 [id number]    Palumbo, April, MD 04/22/17 [id number]

## 2017-05-02 ENCOUNTER — Ambulatory Visit (INDEPENDENT_AMBULATORY_CARE_PROVIDER_SITE_OTHER): Payer: Medicare HMO | Admitting: Family Medicine

## 2017-05-02 ENCOUNTER — Encounter: Payer: Self-pay | Admitting: Family Medicine

## 2017-05-02 VITALS — BP 138/70 | HR 81 | Temp 97.9°F | Wt 152.0 lb

## 2017-05-02 DIAGNOSIS — I5032 Chronic diastolic (congestive) heart failure: Secondary | ICD-10-CM

## 2017-05-02 DIAGNOSIS — N289 Disorder of kidney and ureter, unspecified: Secondary | ICD-10-CM

## 2017-05-02 DIAGNOSIS — IMO0002 Reserved for concepts with insufficient information to code with codable children: Secondary | ICD-10-CM

## 2017-05-02 DIAGNOSIS — E611 Iron deficiency: Secondary | ICD-10-CM | POA: Diagnosis not present

## 2017-05-02 DIAGNOSIS — L720 Epidermal cyst: Secondary | ICD-10-CM

## 2017-05-02 DIAGNOSIS — R258 Other abnormal involuntary movements: Secondary | ICD-10-CM

## 2017-05-02 DIAGNOSIS — D509 Iron deficiency anemia, unspecified: Secondary | ICD-10-CM | POA: Diagnosis not present

## 2017-05-02 DIAGNOSIS — R000 Tachycardia, unspecified: Secondary | ICD-10-CM

## 2017-05-02 DIAGNOSIS — F333 Major depressive disorder, recurrent, severe with psychotic symptoms: Secondary | ICD-10-CM

## 2017-05-02 DIAGNOSIS — E1165 Type 2 diabetes mellitus with hyperglycemia: Secondary | ICD-10-CM

## 2017-05-02 DIAGNOSIS — E1149 Type 2 diabetes mellitus with other diabetic neurological complication: Secondary | ICD-10-CM | POA: Diagnosis not present

## 2017-05-02 DIAGNOSIS — R768 Other specified abnormal immunological findings in serum: Secondary | ICD-10-CM

## 2017-05-02 DIAGNOSIS — Z951 Presence of aortocoronary bypass graft: Secondary | ICD-10-CM

## 2017-05-02 DIAGNOSIS — R69 Illness, unspecified: Secondary | ICD-10-CM | POA: Diagnosis not present

## 2017-05-02 MED ORDER — PANTOPRAZOLE SODIUM 20 MG PO TBEC: 20.0000 mg | DELAYED_RELEASE_TABLET | Freq: Every day | ORAL | 6 refills | Status: DC

## 2017-05-02 MED ORDER — METOPROLOL TARTRATE 25 MG PO TABS: 12.5000 mg | ORAL_TABLET | Freq: Every day | ORAL | 6 refills | Status: DC

## 2017-05-02 NOTE — Patient Instructions (Addendum)
Labs today I'm glad nocturnal leg symptoms are doing better. We will see how sugars are running today. We will be in touch with results and plan. Return as needed or in 3-4 months for follow up visit.

## 2017-05-02 NOTE — Progress Notes (Signed)
BP 138/70 (BP Location: Left Arm, Patient Position: Sitting, Cuff Size: Normal)   Pulse 81   Temp 97.9 F (36.6 C) (Oral)   Wt 152 lb (68.9 kg)   SpO2 98%   BMI 26.93 kg/m    CC: 4 mo f/u visit Subjective:    Patient ID: Maria Fox, female    DOB: 10-15-1945, 72 y.o.   MRN: [id number]  HPI: Maria Fox is a 72 y.o. female presenting on 05/02/2017 for 4 mo follow-up   Seen 10 days ago at ER with generalized abdominal pain after starting iron supplements, evaluation unrevealing (records reviewed). Started on protonix. cbg 300s. UA showing glucosuria. CT abdomen pelvis showing gallstones and mild diverticulosis.   DM - she has been checking sugars at home: 170 fasting, checks 3 times a week. Doesn't know glucose meter brand. Compliant with metformin 500mg  tid. Denies paresthesias. Denies low sugars or hypoglycemic symptoms. Due for eye exam. prevnar  12/2016. Pneumococcal unspecified 11/2010 (presumed pneumovax). Foot exam done 12/2016.   Ongoing stiffness and pain at bilateral 3rd MCP joints with popping joints. Denies synovitis. Known vitiligo.   She stopped trazodone for sleep.  Requests nerve pill.  Neck cyst was removed Domenic Polite). She is happy with results.   Relevant past medical, surgical, family and social history reviewed and updated as indicated. Interim medical history since our last visit reviewed. Allergies and medications reviewed and updated. Outpatient Medications Prior to Visit  Medication Sig Dispense Refill  . albuterol (PROVENTIL HFA;VENTOLIN HFA) 108 (90 Base) MCG/ACT inhaler Inhale 2 puffs into the lungs every 4 (four) hours as needed for wheezing or shortness of breath. 1 Inhaler 0  . aspirin EC 81 MG tablet Take 81 mg by mouth daily.     Marland Kitchen atorvastatin (LIPITOR) 40 MG tablet Take 1 tablet (40 mg total) by mouth at bedtime. 90 tablet 3  . clopidogrel (PLAVIX) 75 MG tablet TAKE ONE TABLET BY MOUTH DAILY 90 tablet 0  . docusate sodium (COLACE) 100 MG capsule  Take 100 mg by mouth daily as needed for mild constipation.    . gabapentin (NEURONTIN) 100 MG capsule Take one tablet in am, one in afternoon, three in evening 150 capsule 6  . metFORMIN (GLUCOPHAGE) 500 MG tablet Take 1 tablet (500 mg total) by mouth 3 (three) times daily with meals. 90 tablet 6  . metoCLOPramide (REGLAN) 10 MG tablet Take 10 mg by mouth every 6 (six) hours as needed for nausea or vomiting.    . traZODone (DESYREL) 50 MG tablet Take 1.5 tablets (75 mg total) by mouth at bedtime as needed for sleep. 45 tablet 6  . triamcinolone cream (KENALOG) 0.1 % Apply 1 application topically 2 (two) times daily. Apply to AA. 30 g 3  . ferrous sulfate 325 (65 FE) MG tablet Take 1 tablet (325 mg total) by mouth daily with breakfast.    . metoprolol tartrate (LOPRESSOR) 25 MG tablet TAKE ONE-HALF TABLET BY MOUTH DAILY 15 tablet 3  . pantoprazole (PROTONIX) 20 MG tablet Take 1 tablet (20 mg total) by mouth daily. 30 tablet 0   No facility-administered medications prior to visit.      Per HPI unless specifically indicated in ROS section below Review of Systems     Objective:    BP 138/70 (BP Location: Left Arm, Patient Position: Sitting, Cuff Size: Normal)   Pulse 81   Temp 97.9 F (36.6 C) (Oral)   Wt 152 lb (68.9 kg)   SpO2 98%  BMI 26.93 kg/m   Wt Readings from Last 3 Encounters:  05/02/17 152 lb (68.9 kg)  04/21/17 145 lb (65.8 kg)  03/25/17 148 lb (67.1 kg)    Physical Exam  Constitutional: She appears well-developed and well-nourished. No distress.  HENT:  Mouth/Throat: Oropharynx is clear and moist. No oropharyngeal exudate.  Cardiovascular: Normal rate, regular rhythm, normal heart sounds and intact distal pulses.  No murmur heard. Pulmonary/Chest: Effort normal and breath sounds normal. No respiratory distress. She has no wheezes. She has no rales.  Musculoskeletal: She exhibits no edema.  Discomfort to palpation bilateral 3rd MCPs with limited flexion, no swelling  or erythema or warmth.   Skin: Skin is warm and dry.  Hypopigmentation of hands from vitiligo  Psychiatric: She has a normal mood and affect.  Nursing note and vitals reviewed.  Results for orders placed or performed in visit on 05/02/17  Ferritin  Result Value Ref Range   Ferritin 50 20 - 288 ng/mL  Sedimentation rate  Result Value Ref Range   Sed Rate 9 0 - 30 mm/h  Hemoglobin A1c  Result Value Ref Range   Hgb A1c MFr Bld 10.1 (H) <5.7 % of total Hgb   Mean Plasma Glucose 243 (calc)   eAG (mmol/L) 13.5 (calc)  TSH  Result Value Ref Range   TSH 2.64 0.40 - 4.50 mIU/L      Assessment & Plan:   Problem List Items Addressed This Visit    Diastolic CHF ([place])    Seems euvolemic off lasix. Continue to monitor.       Relevant Medications   metoprolol tartrate (LOPRESSOR) 25 MG tablet   DM (diabetes mellitus), type 2, uncontrolled w/neurologic complication ([name]) - Primary    Chronic. Taking metformin 500mg  TID. Recent recall sugars elevated. Overdue for eye exam. Check A1c today then determine need for further treatment.       Relevant Orders   Hemoglobin A1c (Completed)   RESOLVED: Epidermal cyst of neck    S/p excision by derm      Iron deficiency    Update ferritin. H/o iron deficiency without anemia. Oral iron caused GI upset, now off this.       Relevant Orders   Ferritin (Completed)   MDD (major depressive disorder), recurrent, severe, with psychosis ([place])    Off medication besides trazodone 75mg  nightly. Pt desires to stay off other medication at this time. Declines return to psychiatry.       Nocturnal leg movements    This has improved. Now off iron. Continue to monitor. Previously ?RLS - discussed if recurring to notify me, would consider dopamine agonist.  Check ferritin today.       Relevant Orders   Sedimentation rate (Completed)   TSH (Completed)   Positive hepatitis C antibody test   Renal insufficiency    Update labs today.       S/P CABG x 3      Reviewed medications - endorses compliance with metoprolol, plavix, aspirin and lipitor.       Other Visit Diagnoses    Tachycardia       Relevant Medications   metoprolol tartrate (LOPRESSOR) 25 MG tablet       Meds ordered this encounter  Medications  . metoprolol tartrate (LOPRESSOR) 25 MG tablet    Sig: Take 0.5 tablets (12.5 mg total) by mouth daily.    Dispense:  15 tablet    Refill:  6  . pantoprazole (PROTONIX) 20 MG tablet  Sig: Take 1 tablet (20 mg total) by mouth daily.    Dispense:  30 tablet    Refill:  6   Orders Placed This Encounter  Procedures  . Ferritin  . Sedimentation rate  . Hemoglobin A1c  . TSH    Follow up plan: Return in about 3 months (around 07/30/2017) for follow up visit.  [name], MD

## 2017-05-03 ENCOUNTER — Other Ambulatory Visit: Payer: Self-pay | Admitting: Family Medicine

## 2017-05-03 ENCOUNTER — Encounter: Payer: Self-pay | Admitting: Family Medicine

## 2017-05-03 DIAGNOSIS — E611 Iron deficiency: Secondary | ICD-10-CM

## 2017-05-03 LAB — TSH: TSH: 2.64 m[IU]/L (ref 0.40–4.50)

## 2017-05-03 LAB — HEMOGLOBIN A1C
EAG (MMOL/L): 13.5 (calc)
HEMOGLOBIN A1C: 10.1 %{Hb} — AB (ref <5.7)
MEAN PLASMA GLUCOSE: 243 (calc)

## 2017-05-03 LAB — FERRITIN: Ferritin: 50 ng/mL (ref 20–288)

## 2017-05-03 LAB — SEDIMENTATION RATE: SED RATE: 9 mm/h (ref 0–30)

## 2017-05-03 MED ORDER — GLIMEPIRIDE 2 MG PO TABS: 2.0000 mg | ORAL_TABLET | Freq: Every day | ORAL | 3 refills | Status: DC

## 2017-05-03 NOTE — Assessment & Plan Note (Signed)
Update labs today 

## 2017-05-03 NOTE — Assessment & Plan Note (Signed)
This has improved. Now off iron. Continue to monitor. Previously ?RLS - discussed if recurring to notify me, would consider dopamine agonist.  Check ferritin today.

## 2017-05-03 NOTE — Assessment & Plan Note (Addendum)
Chronic. Taking metformin 500mg  TID. Recent recall sugars elevated. Overdue for eye exam. Check A1c today then determine need for further treatment.

## 2017-05-03 NOTE — Assessment & Plan Note (Signed)
Seems euvolemic off lasix. Continue to monitor.

## 2017-05-03 NOTE — Assessment & Plan Note (Addendum)
Off medication besides trazodone 75mg  nightly. Pt desires to stay off other medication at this time. Declines return to psychiatry.

## 2017-05-03 NOTE — Assessment & Plan Note (Signed)
S/p excision by derm

## 2017-05-03 NOTE — Assessment & Plan Note (Signed)
Reviewed medications - endorses compliance with metoprolol, plavix, aspirin and lipitor.

## 2017-05-03 NOTE — Assessment & Plan Note (Signed)
Update ferritin. H/o iron deficiency without anemia. Oral iron caused GI upset, now off this.

## 2017-05-29 ENCOUNTER — Ambulatory Visit: Payer: Medicare HMO | Admitting: Family Medicine

## 2017-05-30 ENCOUNTER — Ambulatory Visit: Payer: Self-pay | Admitting: Family Medicine

## 2017-06-07 ENCOUNTER — Other Ambulatory Visit: Payer: Self-pay

## 2017-06-07 ENCOUNTER — Ambulatory Visit (HOSPITAL_COMMUNITY)
Admission: EM | Admit: 2017-06-07 | Discharge: 2017-06-07 | Disposition: A | Discharge status: Home / Self Care | Payer: Medicare HMO | Attending: Family Medicine | Admitting: Family Medicine

## 2017-06-07 ENCOUNTER — Encounter (HOSPITAL_COMMUNITY): Payer: Self-pay | Admitting: *Deleted

## 2017-06-07 DIAGNOSIS — M791 Myalgia, unspecified site: Secondary | ICD-10-CM

## 2017-06-07 DIAGNOSIS — J069 Acute upper respiratory infection, unspecified: Secondary | ICD-10-CM | POA: Diagnosis not present

## 2017-06-07 DIAGNOSIS — R05 Cough: Secondary | ICD-10-CM

## 2017-06-07 DIAGNOSIS — B9789 Other viral agents as the cause of diseases classified elsewhere: Secondary | ICD-10-CM

## 2017-06-07 MED ORDER — KETOROLAC TROMETHAMINE 30 MG/ML IJ SOLN
30.0000 mg | Freq: Once | INTRAMUSCULAR | Status: AC
  Administered 2017-06-07: 30 mg via INTRAMUSCULAR

## 2017-06-07 MED ORDER — HYDROCODONE-HOMATROPINE 5-1.5 MG/5ML PO SYRP: 5.0000 mL | ORAL_SOLUTION | Freq: Four times a day (QID) | ORAL | 0 refills | Status: DC | PRN

## 2017-06-07 MED ORDER — KETOROLAC TROMETHAMINE 30 MG/ML IJ SOLN
INTRAMUSCULAR | Status: AC
  Filled 2017-06-07: qty 1

## 2017-06-07 NOTE — ED Triage Notes (Signed)
C/O nasal congestion, cough, body aches, HA since yesterday.  Unsure if fevers.  Also c/o bilat finger spasms.

## 2017-06-07 NOTE — Discharge Instructions (Signed)
Be aware, your cough medication may cause drowsiness. Please do not drive, operate heavy machinery or make important decisions while on this medication, it can cloud your judgement. ° °Follow up with your primary care doctor or here if you are not seeing improvement of your symptoms over the next several days, sooner if you feel you are worsening. ° °Caring for yourself: °Get plenty of rest. °Drink plenty of fluids, enough so that your urine is light yellow or clear like water. If you have kidney, heart, or liver disease and have to limit fluids, talk with your doctor before you increase the amount of fluids you drink. °Take an over-the-counter pain medicine if needed, such as acetaminophen (Tylenol), ibuprofen (Advil, Motrin), or naproxen (Aleve), to relieve fever, headache, and muscle aches. Read and follow all instructions on the label. No one younger than 20 should take aspirin. It has been linked to Reye syndrome, a serious illness. °Before you use over the counter cough and cold medicines, check the label. These medicines may not be safe for children younger than age 6 or for people with certain health problems. °If the skin around your nose and lips becomes sore, put some petroleum jelly on the area. ° °Avoid spreading the flu: °Wash your hands regularly, and keep your hands away from your face.  °Stay home from school, work, and other public places until you are feeling better and your fever has been gone for at least 24 hours. The fever needs to have gone away on its own without the help of medicine. °

## 2017-06-09 ENCOUNTER — Ambulatory Visit (INDEPENDENT_AMBULATORY_CARE_PROVIDER_SITE_OTHER): Payer: Medicare HMO | Admitting: Family Medicine

## 2017-06-09 ENCOUNTER — Encounter: Payer: Self-pay | Admitting: Family Medicine

## 2017-06-09 VITALS — BP 118/80 | HR 83 | Temp 97.6°F | Wt 159.0 lb

## 2017-06-09 DIAGNOSIS — M65331 Trigger finger, right middle finger: Secondary | ICD-10-CM

## 2017-06-09 DIAGNOSIS — E1165 Type 2 diabetes mellitus with hyperglycemia: Secondary | ICD-10-CM | POA: Diagnosis not present

## 2017-06-09 DIAGNOSIS — E1149 Type 2 diabetes mellitus with other diabetic neurological complication: Secondary | ICD-10-CM | POA: Diagnosis not present

## 2017-06-09 DIAGNOSIS — J22 Unspecified acute lower respiratory infection: Secondary | ICD-10-CM | POA: Diagnosis not present

## 2017-06-09 DIAGNOSIS — M6530 Trigger finger, unspecified finger: Secondary | ICD-10-CM | POA: Insufficient documentation

## 2017-06-09 DIAGNOSIS — R110 Nausea: Secondary | ICD-10-CM | POA: Diagnosis not present

## 2017-06-09 DIAGNOSIS — R05 Cough: Secondary | ICD-10-CM | POA: Diagnosis not present

## 2017-06-09 DIAGNOSIS — R059 Cough, unspecified: Secondary | ICD-10-CM

## 2017-06-09 DIAGNOSIS — IMO0002 Reserved for concepts with insufficient information to code with codable children: Secondary | ICD-10-CM

## 2017-06-09 MED ORDER — AZITHROMYCIN 250 MG PO TABS: ORAL_TABLET | ORAL | 0 refills | Status: DC

## 2017-06-09 MED ORDER — METOCLOPRAMIDE HCL 10 MG PO TABS: 10.0000 mg | ORAL_TABLET | Freq: Four times a day (QID) | ORAL | 0 refills | Status: DC | PRN

## 2017-06-09 MED ORDER — BENZONATATE 100 MG PO CAPS
100.0000 mg | ORAL_CAPSULE | Freq: Three times a day (TID) | ORAL | 0 refills | Status: DC | PRN
Start: 2017-06-09 — End: 2017-10-09

## 2017-06-09 MED ORDER — METFORMIN HCL 500 MG PO TABS: 500.0000 mg | ORAL_TABLET | Freq: Three times a day (TID) | ORAL | 6 refills | Status: DC

## 2017-06-09 NOTE — Patient Instructions (Addendum)
I think you have a bronchitis with wheezing.  Treat with zpack antibiotic and tessalon perls.  Push fluids and rest.  Let us know if not improving with treatment.  Watch those sugar readings. Restart metformin - 1 pll for several days then 2 then 3 - slowly build up.  Return in 2 months for diabetes follow up visit.

## 2017-06-09 NOTE — Assessment & Plan Note (Signed)
Acute bronchitis vs other respiratory infection, not consistent with pneumonia. Pt declined nasal flu swab today. Treat aggressively given comorbidities with zpack antibiotic and tessalon perls. Avoid prednisone and cough syrups due to concern for hyperglycemia. Update if not improving with treatment. Pt agrees with plan.

## 2017-06-09 NOTE — Progress Notes (Signed)
BP 118/80 (BP Location: Left Arm, Patient Position: Sitting, Cuff Size: Normal)   Pulse 83   Temp 97.6 F (36.4 C) (Oral)   Wt 159 lb (72.1 kg)   SpO2 96%   BMI 28.17 kg/m    CC: "I think I have bronchitis again" Subjective:    Patient ID: Maria Fox, female    DOB: 01-05-1946, 72 y.o.   MRN: [id number]  HPI: Maria Fox is a 72 y.o. female presenting on 06/09/2017 for Cough (Productive cough. Stareted 06/06/17. Concerned she may have bronchitis again.); Ear Pain (Pain in bilateral ears, right is worse.); and Hand Pain (Having pain in all fingers when trying to bend them. Middle fingers are worse. )   4d h/o productive cough of clear mucous, worse at night time. PNDrainage. Wheezing. Rhinorrhea and head and chest congestion. Bad headache yesterday and today. + body aches. Sudden onset on Friday.   No fevers, tooth pain, ST, dyspnea.  Hasn't tried any OTC regimen for this. Seen at [place] Saturday night - note not yet available. Pt states she was treated with cough syrup but she didn't pick this up.   [name] her significant other also sick with cough.  Non smoker No known h/o asthma, COPD.   Also notes triggering of R>L 3rd middle finger, ongoing for 1 month. Requests referral for further management.   Relevant past medical, surgical, family and social history reviewed and updated as indicated. Interim medical history since our last visit reviewed. Allergies and medications reviewed and updated. Outpatient Medications Prior to Visit  Medication Sig Dispense Refill  . aspirin EC 81 MG tablet Take 81 mg by mouth daily.     Marland Kitchen atorvastatin (LIPITOR) 40 MG tablet Take 1 tablet (40 mg total) by mouth at bedtime. 90 tablet 3  . clopidogrel (PLAVIX) 75 MG tablet TAKE ONE TABLET BY MOUTH DAILY 90 tablet 0  . docusate sodium (COLACE) 100 MG capsule Take 100 mg by mouth daily as needed for mild constipation.    . gabapentin (NEURONTIN) 100 MG capsule Take one tablet in am, one in  afternoon, three in evening 150 capsule 6  . glimepiride (AMARYL) 2 MG tablet Take 1 tablet (2 mg total) by mouth daily before breakfast. 30 tablet 3  . metoprolol tartrate (LOPRESSOR) 25 MG tablet Take 0.5 tablets (12.5 mg total) by mouth daily. 15 tablet 6  . pantoprazole (PROTONIX) 20 MG tablet Take 1 tablet (20 mg total) by mouth daily. 30 tablet 6  . traZODone (DESYREL) 50 MG tablet Take 1.5 tablets (75 mg total) by mouth at bedtime as needed for sleep. 45 tablet 6  . triamcinolone cream (KENALOG) 0.1 % Apply 1 application topically 2 (two) times daily. Apply to AA. 30 g 3  . albuterol (PROVENTIL HFA;VENTOLIN HFA) 108 (90 Base) MCG/ACT inhaler Inhale 2 puffs into the lungs every 4 (four) hours as needed for wheezing or shortness of breath. 1 Inhaler 0  . HYDROcodone-homatropine (HYCODAN) 5-1.5 MG/5ML syrup Take 5 mLs by mouth every 6 (six) hours as needed for cough. 90 mL 0  . metFORMIN (GLUCOPHAGE) 500 MG tablet Take 1 tablet (500 mg total) by mouth 3 (three) times daily with meals. 90 tablet 6  . metoCLOPramide (REGLAN) 10 MG tablet Take 10 mg by mouth every 6 (six) hours as needed for nausea or vomiting.     No facility-administered medications prior to visit.      Per HPI unless specifically indicated in ROS section below Review  of Systems     Objective:    BP 118/80 (BP Location: Left Arm, Patient Position: Sitting, Cuff Size: Normal)   Pulse 83   Temp 97.6 F (36.4 C) (Oral)   Wt 159 lb (72.1 kg)   SpO2 96%   BMI 28.17 kg/m   Wt Readings from Last 3 Encounters:  06/09/17 159 lb (72.1 kg)  05/02/17 152 lb (68.9 kg)  04/21/17 145 lb (65.8 kg)    Physical Exam  Constitutional: She appears well-developed and well-nourished. No distress.  HENT:  Head: Normocephalic and atraumatic.  Right Ear: Hearing, tympanic membrane, external ear and ear canal normal.  Left Ear: Hearing, tympanic membrane, external ear and ear canal normal.  Nose: Mucosal edema and rhinorrhea present.  Right sinus exhibits no maxillary sinus tenderness and no frontal sinus tenderness. Left sinus exhibits no maxillary sinus tenderness and no frontal sinus tenderness.  Mouth/Throat: Uvula is midline and mucous membranes are normal. Posterior oropharyngeal erythema present. No oropharyngeal exudate, posterior oropharyngeal edema or tonsillar abscesses.  Eyes: Pupils are equal, round, and reactive to light. Conjunctivae and EOM are normal. No scleral icterus.  Neck: Normal range of motion. Neck supple.  Cardiovascular: Normal rate, regular rhythm, normal heart sounds and intact distal pulses.  No murmur heard. Pulmonary/Chest: Effort normal. No respiratory distress. She has decreased breath sounds. She has wheezes (mild bilateral, with some upper airway transmission). She has no rhonchi. She has no rales.  Musculoskeletal: Normal range of motion. She exhibits no edema.  Evident triggering of R middle finger with pain and swelling palmar surface  Lymphadenopathy:    She has no cervical adenopathy.  Skin: Skin is warm and dry. No rash noted.  Nursing note and vitals reviewed.  Results for orders placed or performed in visit on 05/02/17  Ferritin  Result Value Ref Range   Ferritin 50 20 - 288 ng/mL  Sedimentation rate  Result Value Ref Range   Sed Rate 9 0 - 30 mm/h  Hemoglobin A1c  Result Value Ref Range   Hgb A1c MFr Bld 10.1 (H) <5.7 % of total Hgb   Mean Plasma Glucose 243 (calc)   eAG (mmol/L) 13.5 (calc)  TSH  Result Value Ref Range   TSH 2.64 0.40 - 4.50 mIU/L      Assessment & Plan:   Problem List Items Addressed This Visit    Acute respiratory infection - Primary    Acute bronchitis vs other respiratory infection, not consistent with pneumonia. Pt declined nasal flu swab today. Treat aggressively given comorbidities with zpack antibiotic and tessalon perls. Avoid prednisone and cough syrups due to concern for hyperglycemia. Update if not improving with treatment. Pt agrees with  plan.       Relevant Medications   azithromycin (ZITHROMAX) 250 MG tablet   Cough   DM (diabetes mellitus), type 2, uncontrolled w/neurologic complication ([name])    Chronic, uncontrolled. She started amaryl but mistakenly stopped metformin. Advised she slowly restart metformin and will reassess control  next visit.       Relevant Medications   metFORMIN (GLUCOPHAGE) 500 MG tablet   Nausea without vomiting    Requests reglan refilled.      Trigger finger    Triggering R>L evident today. Poor glycemic control worsens this. Discussed with patient. She is interested in referral to hand surgery - referral placed.       Relevant Orders   Ambulatory referral to Hand Surgery       Meds ordered this encounter  Medications  . metFORMIN (GLUCOPHAGE) 500 MG tablet    Sig: Take 1 tablet (500 mg total) by mouth 3 (three) times daily with meals.    Dispense:  90 tablet    Refill:  6  . metoCLOPramide (REGLAN) 10 MG tablet    Sig: Take 1 tablet (10 mg total) by mouth every 6 (six) hours as needed for nausea or vomiting.    Dispense:  30 tablet    Refill:  0  . azithromycin (ZITHROMAX) 250 MG tablet    Sig: Take two tablets on day one followed by one tablet on days 2-5    Dispense:  6 each    Refill:  0  . benzonatate (TESSALON) 100 MG capsule    Sig: Take 1 capsule (100 mg total) by mouth 3 (three) times daily as needed for cough.    Dispense:  40 capsule    Refill:  0   Orders Placed This Encounter  Procedures  . Ambulatory referral to Hand Surgery    Referral Priority:   Routine    Referral Type:   Surgical    Referral Reason:   Specialty Services Required    Requested Specialty:   Hand Surgery    Number of Visits Requested:   1    Follow up plan: Return in about 2 months (around 08/09/2017) for follow up visit.  [name], MD

## 2017-06-09 NOTE — Assessment & Plan Note (Addendum)
Chronic, uncontrolled. She started amaryl but mistakenly stopped metformin. Advised she slowly restart metformin and will reassess control  next visit.

## 2017-06-09 NOTE — Assessment & Plan Note (Signed)
Requests reglan refilled.

## 2017-06-09 NOTE — Assessment & Plan Note (Signed)
Triggering R>L evident today. Poor glycemic control worsens this. Discussed with patient. She is interested in referral to hand surgery - referral placed.

## 2017-06-10 ENCOUNTER — Telehealth: Payer: Self-pay

## 2017-06-10 NOTE — Telephone Encounter (Signed)
Left message for patient to call Anastasiya back in regards to a referral-Anastasiya V Hopkins, RMA   

## 2017-06-12 DIAGNOSIS — M65332 Trigger finger, left middle finger: Secondary | ICD-10-CM | POA: Diagnosis not present

## 2017-06-12 DIAGNOSIS — M65331 Trigger finger, right middle finger: Secondary | ICD-10-CM | POA: Diagnosis not present

## 2017-06-12 NOTE — ED Provider Notes (Signed)
[name]   [id number] 06/07/17 Arrival Time: 6283  ASSESSMENT & PLAN:  1. Viral URI with cough     Meds ordered this encounter  Medications  . HYDROcodone-homatropine (HYCODAN) 5-1.5 MG/5ML syrup    Sig: Take 5 mLs by mouth every 6 (six) hours as needed for cough.    Dispense:  90 mL    Refill:  0  . ketorolac (TORADOL) 30 MG/ML injection 30 mg   Cough medication sedation precautions. Discussed typical duration of symptoms. OTC symptom care as needed. Ensure adequate fluid intake and rest. May f/u with PCP or here as needed.  Reviewed expectations re: course of current medical issues. Questions answered. Outlined signs and symptoms indicating need for more acute intervention. Patient verbalized understanding. After Visit Summary given.   SUBJECTIVE: History from: patient.  Maria Fox is a 72 y.o. female who presents with complaint of nasal congestion, post-nasal drainage, and a persistent dry cough. Onset abrupt, approximately 1 day ago. Overall fatigued with body aches. SOB: none. Wheezing: none. Fever: questions subjective. Overall normal PO intake without n/v. Sick contacts: no. OTC treatment: cold med without much help.   Social History   Tobacco Use  Smoking Status Never Smoker  Smokeless Tobacco Never Used    ROS: As per HPI.   OBJECTIVE:  Vitals:   06/07/17 1852  BP: (!) 161/85  Pulse: 85  Resp: 20  Temp: 98.4 F (36.9 C)  TempSrc: Oral  SpO2: 97%     General appearance: alert; appears fatigued HEENT: nasal congestion; clear runny nose; throat irritation secondary to post-nasal drainage Neck: supple without LAD Lungs: unlabored respirations, symmetrical air entry; cough: moderate; no respiratory distress Skin: warm and dry Psychological: alert and cooperative; normal mood and affect  Imaging: No results found.  Allergies  Allergen Reactions  . Mushroom Extract Complex Anaphylaxis  . Codeine Nausea And Vomiting  .  Penicillins Hives and Swelling    Has patient had a PCN reaction causing immediate rash, facial/tongue/throat swelling, SOB or lightheadedness with hypotension: Yes Has patient had a PCN reaction causing severe rash involving mucus membranes or skin necrosis: Yes Has patient had a PCN reaction that required hospitalization Yes Has patient had a PCN reaction occurring within the last 10 years: No If all of the above answers are "NO", then may proceed with Cephalosporin use.   . Sulfa Antibiotics Nausea And Vomiting  . Erythromycin Base Rash    Past Medical History:  Diagnosis Date  . Acute left PCA stroke ([place]) 07/13/2015  . Angioedema   . Autoimmune deficiency syndrome ([facility])   . CAD (coronary artery disease), native coronary artery 06/2014   s/p NSTEMI with cath showing severe diffuse disease of the mid LAD, occluded large diagonal, 80-90% prox OM and normal dominant RCA.   . Carotid arterial disease ([name])    <50% bilaterally  . Closed fracture of maxilla ([place])   . CVA (cerebral infarction) 07/2014   bilateral corona radiata - periCABG  . Dermatitis    eval [name] 2011: eczema, eval [name] 2011: bx negative for lichen simplex or derm herpetiformis  . Diastolic CHF ([place]) 6/62/9476  . DM (diabetes mellitus), type 2, uncontrolled w/neurologic complication ([place]) 5/46/5035   ?autonomic neuropathy, gastroparesis (06/2014)   . Epidermal cyst of neck 03/25/2017   Excised by derm Domenic Polite)  . HCAP (healthcare-associated pneumonia) 06/2014  . History of chicken pox   . HLD (hyperlipidemia)   . Hx of migraines    remote  . Hypertension   .  Maxillary fracture ([place])   . Multiple allergies    mold, wool, dust, feathers  . Myocardial infarction ([place]) 2016  . Orthostatic hypotension 07/2015  . Pleural effusion, left   . S/P lens implant    left side ([name])  . UTI (urinary tract infection) 06/2014  . Vitiligo    Family History  Problem Relation Age of Onset  . Cancer Father        throat    . Diabetes type II Mother   . Hypertension Mother   . Hyperlipidemia Mother   . Cancer Maternal Grandmother        cervical  . Hypertension Brother   . Hyperlipidemia Brother   . Asthma Brother   . Coronary artery disease Neg Hx   . Stroke Neg Hx    Social History   Socioeconomic History  . Marital status: Single    Spouse name: Not on file  . Number of children: Not on file  . Years of education: Not on file  . Highest education level: Not on file  Occupational History  . Occupation: Radio broadcast assistant: [employer]  Social Needs  . Financial resource strain: Not on file  . Food insecurity:    Worry: Not on file    Inability: Not on file  . Transportation needs:    Medical: Not on file    Non-medical: Not on file  Tobacco Use  . Smoking status: Never Smoker  . Smokeless tobacco: Never Used  Substance and Sexual Activity  . Alcohol use: No  . Drug use: No  . Sexual activity: Not on file  Lifestyle  . Physical activity:    Days per week: Not on file    Minutes per session: Not on file  . Stress: Not on file  Relationships  . Social connections:    Talks on phone: Not on file    Gets together: Not on file    Attends religious service: Not on file    Active member of club or organization: Not on file    Attends meetings of clubs or organizations: Not on file    Relationship status: Not on file  . Intimate partner violence:    Fear of current or ex partner: Not on file    Emotionally abused: Not on file    Physically abused: Not on file    Forced sexual activity: Not on file  Other Topics Concern  . Not on file  Social History Narrative   Caffeine: occasional   Lives with friend, [name], 1 cat   Occupation: Web designer, and mailer at [employer]   Edu: HS   Activity: walks daily (2-3 blocks)   Diet: good water, daily fruits/vegetables      [facility] unable to reach patient to establish care (04/2015)            Vanessa Kick, MD 06/12/17 0930

## 2017-06-17 ENCOUNTER — Telehealth: Payer: Self-pay

## 2017-06-17 NOTE — Telephone Encounter (Signed)
Received PA approval for gabapentin 100 mg cap effective 03/11/2017 until 03/10/2018.

## 2017-06-28 ENCOUNTER — Other Ambulatory Visit: Payer: Self-pay | Admitting: Family Medicine

## 2017-06-28 ENCOUNTER — Other Ambulatory Visit: Payer: Self-pay | Admitting: Primary Care

## 2017-06-28 DIAGNOSIS — E785 Hyperlipidemia, unspecified: Secondary | ICD-10-CM

## 2017-07-14 DIAGNOSIS — M65331 Trigger finger, right middle finger: Secondary | ICD-10-CM | POA: Diagnosis not present

## 2017-07-14 DIAGNOSIS — M65332 Trigger finger, left middle finger: Secondary | ICD-10-CM | POA: Diagnosis not present

## 2017-07-24 ENCOUNTER — Ambulatory Visit (INDEPENDENT_AMBULATORY_CARE_PROVIDER_SITE_OTHER): Payer: Medicare HMO | Admitting: Podiatry

## 2017-07-24 ENCOUNTER — Encounter: Payer: Self-pay | Admitting: Podiatry

## 2017-07-24 DIAGNOSIS — M79674 Pain in right toe(s): Secondary | ICD-10-CM | POA: Diagnosis not present

## 2017-07-24 DIAGNOSIS — M79675 Pain in left toe(s): Secondary | ICD-10-CM | POA: Diagnosis not present

## 2017-07-24 DIAGNOSIS — D689 Coagulation defect, unspecified: Secondary | ICD-10-CM | POA: Diagnosis not present

## 2017-07-24 DIAGNOSIS — B351 Tinea unguium: Secondary | ICD-10-CM | POA: Diagnosis not present

## 2017-07-24 NOTE — Progress Notes (Signed)
Subjective: 72 y.o. returns the office today for painful, elongated, thickened toenails which she cannot trim herself. Denies any redness or drainage around the nails. She states they are causing pain in her shoes. Denies any acute changes since last appointment and no new complaints today. Denies any systemic complaints such as fevers, chills, nausea, vomiting.   PCP: [name], MD Last Seen: 06/09/2017  A1c: 10.1 on 05/02/2017  She is on plavix.   Objective: AAO 3, NAD DP/PT pulses palpable, CRT less than 3 seconds Nails hypertrophic, dystrophic, elongated, brittle, discolored 10. There is tenderness overlying the nails 1-5 bilaterally. There is no surrounding erythema or drainage along the nail sites. No open lesions or pre-ulcerative lesions are identified. No other areas of tenderness bilateral lower extremities. No overlying edema, erythema, increased warmth. No pain with calf compression, swelling, warmth, erythema.  Assessment: Patient presents with symptomatic onychomycosis  Plan: -Treatment options including alternatives, risks, complications were discussed -Nails sharply debrided 10 without complication/bleeding. -Discussed daily foot inspection. If there are any changes, to call the office immediately.  -Follow-up in 3 months or sooner if any problems are to arise. In the meantime, encouraged to call the office with any questions, concerns, changes symptoms.  Celesta Gentile, DPM

## 2017-07-30 ENCOUNTER — Ambulatory Visit: Payer: Self-pay | Admitting: Cardiology
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
## 2017-08-15 ENCOUNTER — Encounter: Payer: Self-pay | Admitting: Family Medicine

## 2017-08-15 ENCOUNTER — Ambulatory Visit (INDEPENDENT_AMBULATORY_CARE_PROVIDER_SITE_OTHER): Payer: Medicare HMO | Admitting: Family Medicine

## 2017-08-15 VITALS — BP 122/82 | HR 90 | Temp 97.8°F | Ht 63.0 in | Wt 156.5 lb

## 2017-08-15 DIAGNOSIS — E1165 Type 2 diabetes mellitus with hyperglycemia: Secondary | ICD-10-CM

## 2017-08-15 DIAGNOSIS — I10 Essential (primary) hypertension: Secondary | ICD-10-CM

## 2017-08-15 DIAGNOSIS — K219 Gastro-esophageal reflux disease without esophagitis: Secondary | ICD-10-CM | POA: Diagnosis not present

## 2017-08-15 DIAGNOSIS — E538 Deficiency of other specified B group vitamins: Secondary | ICD-10-CM | POA: Diagnosis not present

## 2017-08-15 DIAGNOSIS — E1149 Type 2 diabetes mellitus with other diabetic neurological complication: Secondary | ICD-10-CM | POA: Diagnosis not present

## 2017-08-15 DIAGNOSIS — IMO0002 Reserved for concepts with insufficient information to code with codable children: Secondary | ICD-10-CM

## 2017-08-15 LAB — POCT GLYCOSYLATED HEMOGLOBIN (HGB A1C): HEMOGLOBIN A1C: 7.1 % — AB (ref 4.0–5.6)

## 2017-08-15 MED ORDER — PANTOPRAZOLE SODIUM 20 MG PO TBEC: 20.0000 mg | DELAYED_RELEASE_TABLET | Freq: Every day | ORAL | 1 refills | Status: DC

## 2017-08-15 MED ORDER — CYANOCOBALAMIN 1000 MCG/ML IJ SOLN
1000.0000 ug | Freq: Once | INTRAMUSCULAR | Status: AC
  Administered 2017-08-15: 1000 ug via INTRAMUSCULAR

## 2017-08-15 MED ORDER — GLIMEPIRIDE 1 MG PO TABS: 1.0000 mg | ORAL_TABLET | Freq: Every day | ORAL | 11 refills | Status: DC

## 2017-08-15 NOTE — Patient Instructions (Addendum)
Decrease glimepiride dose to 1mg  daily (1/2 of current dose until you run out, then start 1mg  new tablets at pharmacy)  Restart pantoprazole 20mg  daily - for heartburn.  Schedule eye doctor for diabetic eye exam.  B12 shot today A1c today.

## 2017-08-15 NOTE — Progress Notes (Signed)
BP 122/82 (BP Location: Left Arm, Patient Position: Sitting, Cuff Size: Normal)   Pulse 90   Temp 97.8 F (36.6 C) (Oral)   Ht 5\' 3"  (1.6 m)   Wt 156 lb 8 oz (71 kg)   SpO2 95%   BMI 27.72 kg/m    CC: 4 mo DM f/u visit Subjective:    Patient ID: Maria Fox, female    DOB: 1945/07/25, 72 y.o.   MRN: [id number]  HPI: Maria Fox is a 72 y.o. female presenting on 08/15/2017 for Diabetes (Here for 2 mo f/u.)   Notes worsening acid reflux - has not been taking PPI Tick bite R lateral side - without tick borne illness symptoms.  Vit B12 deficiency - last shot was last year. No recent labs.  Not taking trazodone.   DM - does regularly check sugars twice daily before breakfast and dinner: 70-90s. Compliant with antihyperglycemic regimen which includes: metformin 500mg  tid, amaryl 2mg  daily. ++ low sugars or hypoglycemic symptoms. Denies paresthesias. Last diabetic eye exam DUE. Pneumococcal unspec 11/2010. Prevnar: 12/2016. Glucometer brand: ?accuchek. DSME: has not completed. Foot exam done 12/2016 Lab Results  Component Value Date   HGBA1C 7.1 (A) 08/15/2017   Diabetic Foot Exam - Simple   Simple Foot Form Diabetic Foot exam was performed with the following findings:  Yes 08/15/2017  4:27 PM  Visual Inspection No deformities, no ulcerations, no other skin breakdown bilaterally:  Yes Sensation Testing Intact to touch and monofilament testing bilaterally:  Yes Pulse Check Posterior Tibialis and Dorsalis pulse intact bilaterally:  Yes Comments    Lab Results  Component Value Date   MICROALBUR 0.7 08/26/2016     Relevant past medical, surgical, family and social history reviewed and updated as indicated. Interim medical history since our last visit reviewed. Allergies and medications reviewed and updated. Outpatient Medications Prior to Visit  Medication Sig Dispense Refill  . aspirin EC 81 MG tablet Take 81 mg by mouth daily.     Marland Kitchen atorvastatin (LIPITOR) 40 MG tablet TAKE  ONE TABLET BY MOUTH EVERY NIGHT AT BEDTIME 90 tablet 1  . azithromycin (ZITHROMAX) 250 MG tablet Take two tablets on day one followed by one tablet on days 2-5 6 each 0  . benzonatate (TESSALON) 100 MG capsule Take 1 capsule (100 mg total) by mouth 3 (three) times daily as needed for cough. 40 capsule 0  . clopidogrel (PLAVIX) 75 MG tablet TAKE ONE TABLET BY MOUTH DAILY 90 tablet 0  . docusate sodium (COLACE) 100 MG capsule Take 100 mg by mouth daily as needed for mild constipation.    . gabapentin (NEURONTIN) 100 MG capsule Take one tablet in am, one in afternoon, three in evening 150 capsule 6  . metFORMIN (GLUCOPHAGE) 500 MG tablet Take 1 tablet (500 mg total) by mouth 3 (three) times daily with meals. 90 tablet 6  . metoCLOPramide (REGLAN) 10 MG tablet Take 1 tablet (10 mg total) by mouth every 6 (six) hours as needed for nausea or vomiting. 30 tablet 0  . metoprolol tartrate (LOPRESSOR) 25 MG tablet Take 0.5 tablets (12.5 mg total) by mouth daily. 15 tablet 6  . traZODone (DESYREL) 50 MG tablet Take 1.5 tablets (75 mg total) by mouth at bedtime as needed for sleep. 45 tablet 6  . triamcinolone cream (KENALOG) 0.1 % Apply 1 application topically 2 (two) times daily. Apply to AA. 30 g 3  . glimepiride (AMARYL) 2 MG tablet Take 1 tablet (2 mg total)  by mouth daily before breakfast. 30 tablet 3  . pantoprazole (PROTONIX) 20 MG tablet Take 1 tablet (20 mg total) by mouth daily. 30 tablet 6   No facility-administered medications prior to visit.      Per HPI unless specifically indicated in ROS section below Review of Systems     Objective:    BP 122/82 (BP Location: Left Arm, Patient Position: Sitting, Cuff Size: Normal)   Pulse 90   Temp 97.8 F (36.6 C) (Oral)   Ht 5\' 3"  (1.6 m)   Wt 156 lb 8 oz (71 kg)   SpO2 95%   BMI 27.72 kg/m   Wt Readings from Last 3 Encounters:  08/15/17 156 lb 8 oz (71 kg)  06/09/17 159 lb (72.1 kg)  05/02/17 152 lb (68.9 kg)    Physical Exam    Constitutional: She appears well-developed and well-nourished. No distress.  HENT:  Head: Normocephalic and atraumatic.  Right Ear: External ear normal.  Left Ear: External ear normal.  Nose: Nose normal.  Mouth/Throat: Oropharynx is clear and moist. No oropharyngeal exudate.  Eyes: Pupils are equal, round, and reactive to light. Conjunctivae and EOM are normal. No scleral icterus.  Neck: Normal range of motion. Neck supple.  Cardiovascular: Normal rate, regular rhythm, normal heart sounds and intact distal pulses.  No murmur heard. Pulmonary/Chest: Effort normal and breath sounds normal. No respiratory distress. She has no wheezes. She has no rales.  Musculoskeletal: She exhibits no edema.  See HPI for foot exam if done  Lymphadenopathy:    She has no cervical adenopathy.  Skin: Skin is warm and dry. No rash noted.  Psychiatric: She has a normal mood and affect.  Nursing note and vitals reviewed.  Results for orders placed or performed in visit on 08/15/17  POCT glycosylated hemoglobin (Hb A1C)  Result Value Ref Range   Hemoglobin A1C 7.1 (A) 4.0 - 5.6 %   HbA1c, POC (prediabetic range)  5.7 - 6.4 %   HbA1c, POC (controlled diabetic range)  0.0 - 7.0 %      Assessment & Plan:   Problem List Items Addressed This Visit    DM (diabetes mellitus), type 2, uncontrolled w/neurologic complication ([place]) - Primary    Chronic. Endorses low readings in the mornings since starting gliempiride - will decrease to 1mg  daily, will check POC A1c today.  Asked to bring in glucometer next visit.       Relevant Medications   glimepiride (AMARYL) 1 MG tablet   Other Relevant Orders   POCT glycosylated hemoglobin (Hb A1C) (Completed)   GERD (gastroesophageal reflux disease)    She has not been taking PPI - rec restart pantoprazole 20mg  daily - refilled.       Relevant Medications   pantoprazole (PROTONIX) 20 MG tablet   Hypertension, essential    Chronic, stable on low dose metoprolol.        Vitamin B12 deficiency    Unsure when we stopped B12 shots- will repeat today and recommend checking B12 levels next labwork.       Relevant Medications   cyanocobalamin ((VITAMIN B-12)) injection 1,000 mcg (Completed) (Start on 08/15/2017  4:45 PM)       Meds ordered this encounter  Medications  . glimepiride (AMARYL) 1 MG tablet    Sig: Take 1 tablet (1 mg total) by mouth daily before breakfast.    Dispense:  30 tablet    Refill:  11  . pantoprazole (PROTONIX) 20 MG tablet  Sig: Take 1 tablet (20 mg total) by mouth daily.    Dispense:  90 tablet    Refill:  1  . cyanocobalamin ((VITAMIN B-12)) injection 1,000 mcg   Orders Placed This Encounter  Procedures  . POCT glycosylated hemoglobin (Hb A1C)    Follow up plan: Return in about 3 months (around 11/15/2017) for follow up visit.  [name], MD

## 2017-08-15 NOTE — Assessment & Plan Note (Signed)
She has not been taking PPI - rec restart pantoprazole 20mg  daily - refilled.

## 2017-08-15 NOTE — Assessment & Plan Note (Signed)
Unsure when we stopped B12 shots- will repeat today and recommend checking B12 levels next labwork.

## 2017-08-15 NOTE — Assessment & Plan Note (Addendum)
Chronic. Endorses low readings in the mornings since starting gliempiride - will decrease to 1mg  daily, will check POC A1c today.  Asked to bring in glucometer next visit.

## 2017-08-15 NOTE — Assessment & Plan Note (Signed)
Chronic, stable on low dose metoprolol.  

## 2017-09-01 DIAGNOSIS — M65332 Trigger finger, left middle finger: Secondary | ICD-10-CM | POA: Diagnosis not present

## 2017-09-01 DIAGNOSIS — M65331 Trigger finger, right middle finger: Secondary | ICD-10-CM | POA: Diagnosis not present

## 2017-09-29 ENCOUNTER — Other Ambulatory Visit: Payer: Self-pay | Admitting: Family Medicine

## 2017-09-29 DIAGNOSIS — E785 Hyperlipidemia, unspecified: Secondary | ICD-10-CM

## 2017-09-30 NOTE — Telephone Encounter (Signed)
Pharmacy request refill on clopidogrel.  Last OV: 08/15/17 Next OV: 11/26/17 Last Rf: #90, 0 refills on 06/30/17  Will refill X 1 to carry through next appt.

## 2017-10-06 DIAGNOSIS — M65332 Trigger finger, left middle finger: Secondary | ICD-10-CM | POA: Diagnosis not present

## 2017-10-06 DIAGNOSIS — M65331 Trigger finger, right middle finger: Secondary | ICD-10-CM | POA: Diagnosis not present

## 2017-10-09 ENCOUNTER — Encounter: Payer: Self-pay | Admitting: *Deleted

## 2017-10-09 ENCOUNTER — Ambulatory Visit
Admission: RE | Admit: 2017-10-09 | Discharge: 2017-10-09 | Disposition: A | Discharge status: Home / Self Care | Payer: Medicare HMO | Source: Ambulatory Visit | Attending: Cardiology | Admitting: Cardiology

## 2017-10-09 ENCOUNTER — Encounter: Payer: Self-pay | Admitting: Cardiology

## 2017-10-09 ENCOUNTER — Other Ambulatory Visit: Payer: Self-pay | Admitting: Family Medicine

## 2017-10-09 ENCOUNTER — Ambulatory Visit (INDEPENDENT_AMBULATORY_CARE_PROVIDER_SITE_OTHER): Payer: Medicare HMO | Admitting: Cardiology

## 2017-10-09 VITALS — BP 118/62 | HR 87 | Ht 63.0 in | Wt 163.0 lb

## 2017-10-09 DIAGNOSIS — I25119 Atherosclerotic heart disease of native coronary artery with unspecified angina pectoris: Secondary | ICD-10-CM | POA: Diagnosis not present

## 2017-10-09 DIAGNOSIS — E7800 Pure hypercholesterolemia, unspecified: Secondary | ICD-10-CM

## 2017-10-09 DIAGNOSIS — I5032 Chronic diastolic (congestive) heart failure: Secondary | ICD-10-CM

## 2017-10-09 DIAGNOSIS — I10 Essential (primary) hypertension: Secondary | ICD-10-CM

## 2017-10-09 DIAGNOSIS — I472 Ventricular tachycardia: Secondary | ICD-10-CM | POA: Diagnosis not present

## 2017-10-09 DIAGNOSIS — I951 Orthostatic hypotension: Secondary | ICD-10-CM | POA: Diagnosis not present

## 2017-10-09 DIAGNOSIS — R000 Tachycardia, unspecified: Secondary | ICD-10-CM

## 2017-10-09 DIAGNOSIS — J9811 Atelectasis: Secondary | ICD-10-CM | POA: Diagnosis not present

## 2017-10-09 DIAGNOSIS — I4729 Other ventricular tachycardia: Secondary | ICD-10-CM
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
## 2017-10-09 NOTE — Patient Instructions (Addendum)
Medication Instructions:  Your physician recommends that you continue on your current medications as directed. Please refer to the Current Medication list given to you today.   Labwork: Your physician recommends that you return for lab work TODAY (bmet, bnp) Your physician recommends that you return for lab work in: 1 week (lipids/liver)    Testing/Procedures: Your physician has requested that you have a lexiscan myoview. For further information please visit [email or web address]. Please follow instruction sheet, as given.  Your physician has requested that you have an echocardiogram. Echocardiography is a painless test that uses sound waves to create images of your heart. It provides your doctor with information about the size and shape of your heart and how well your heart's chambers and valves are working. This procedure takes approximately one hour. There are no restrictions for this procedure.  A chest x-ray takes a picture of the organs and structures inside the chest, including the heart, lungs, and blood vessels. This test can show several things, including, whether the heart is enlarges; whether fluid is building up in the lungs; and whether pacemaker / defibrillator leads are still in place.   Follow-Up:  Your physician recommends that you schedule a follow-up appointment in: [place] [facility] ON [address].  Your physician wants you to follow-up in: [place]. You will receive a reminder letter in the mail two months in advance. If you don't receive a letter, please call our office to schedule the follow-up appointment.   Any Other Special Instructions Will Be Listed Below (If Applicable).     If you need a refill on your cardiac medications before your next appointment, please call your pharmacy.

## 2017-10-10 ENCOUNTER — Telehealth: Payer: Self-pay

## 2017-10-10 DIAGNOSIS — I5043 Acute on chronic combined systolic (congestive) and diastolic (congestive) heart failure: Secondary | ICD-10-CM

## 2017-10-10 LAB — BASIC METABOLIC PANEL
BUN / CREAT RATIO: 14 (ref 12–28)
BUN: 18 mg/dL (ref 8–27)
CO2: 21 mmol/L (ref 20–29)
CREATININE: 1.27 mg/dL — AB (ref 0.57–1.00)
Calcium: 9.1 mg/dL (ref 8.7–10.3)
Chloride: 101 mmol/L (ref 96–106)
GFR calc Af Amer: 48 mL/min/{1.73_m2} — ABNORMAL LOW (ref >59)
GFR, EST NON AFRICAN AMERICAN: 42 mL/min/{1.73_m2} — AB (ref >59)
Glucose: 211 mg/dL — ABNORMAL HIGH (ref 65–99)
Potassium: 4.6 mmol/L (ref 3.5–5.2)
SODIUM: 141 mmol/L (ref 134–144)

## 2017-10-10 LAB — PRO B NATRIURETIC PEPTIDE: NT-Pro BNP: 463 pg/mL — ABNORMAL HIGH (ref 0–301)

## 2017-10-10 MED ORDER — FUROSEMIDE 20 MG PO TABS: 20.0000 mg | ORAL_TABLET | Freq: Every day | ORAL | 3 refills | Status: DC

## 2017-10-10 NOTE — Telephone Encounter (Signed)
Pt aware of lab results and recommendations. Will have repeat BMP drawn 8/9. Scheduling to call and schedule an APP appointment.

## 2017-10-10 NOTE — Telephone Encounter (Signed)
Notes recorded by Frederik Schmidt, RN on 10/10/2017 at 9:48 AM EDT Informed patient of chest xray results. She verbalized understanding. ------

## 2017-10-10 NOTE — Telephone Encounter (Signed)
-----   Message from Sueanne Margarita, MD sent at 10/10/2017  9:42 AM EDT ----- No significant findings on chest xray

## 2017-10-10 NOTE — Telephone Encounter (Signed)
-----   Message from Sueanne Margarita, MD sent at 10/10/2017  4:56 PM EDT ----- Increased BNP likely cause of SOB.  Please start Lasix 20mg  daily and check BMET in 1 week . Please have her followup with extender in 4 weeks.  If SOB does not improve then have her call us back

## 2017-10-14 ENCOUNTER — Telehealth (HOSPITAL_COMMUNITY): Payer: Self-pay | Admitting: *Deleted

## 2017-10-14 NOTE — Telephone Encounter (Signed)
Left message on voicemail per DPR in reference to upcoming appointment scheduled on 10/17/17 with detailed instructions given per Myocardial Perfusion Study Information Sheet for the test. LM to arrive 15 minutes early, and that it is imperative to arrive on time for appointment to keep from having the test rescheduled. If you need to cancel or reschedule your appointment, please call the office within 24 hours of your appointment. Failure to do so may result in a cancellation of your appointment, and a $50 no show fee. Phone number given for call back for any questions. Kirstie Peri, RN

## 2017-10-17 ENCOUNTER — Other Ambulatory Visit: Payer: Self-pay

## 2017-10-17 ENCOUNTER — Ambulatory Visit (HOSPITAL_COMMUNITY): Payer: Medicare HMO | Attending: Cardiology

## 2017-10-17 ENCOUNTER — Encounter (HOSPITAL_BASED_OUTPATIENT_CLINIC_OR_DEPARTMENT_OTHER): Payer: Medicare HMO

## 2017-10-17 ENCOUNTER — Other Ambulatory Visit: Payer: Medicare HMO | Admitting: *Deleted

## 2017-10-17 DIAGNOSIS — Z8673 Personal history of transient ischemic attack (TIA), and cerebral infarction without residual deficits: Secondary | ICD-10-CM | POA: Insufficient documentation

## 2017-10-17 DIAGNOSIS — Z8249 Family history of ischemic heart disease and other diseases of the circulatory system: Secondary | ICD-10-CM | POA: Insufficient documentation

## 2017-10-17 DIAGNOSIS — R9389 Abnormal findings on diagnostic imaging of other specified body structures: Secondary | ICD-10-CM | POA: Insufficient documentation

## 2017-10-17 DIAGNOSIS — I4510 Unspecified right bundle-branch block: Secondary | ICD-10-CM | POA: Diagnosis not present

## 2017-10-17 DIAGNOSIS — I951 Orthostatic hypotension: Secondary | ICD-10-CM

## 2017-10-17 DIAGNOSIS — E7800 Pure hypercholesterolemia, unspecified: Secondary | ICD-10-CM | POA: Insufficient documentation

## 2017-10-17 DIAGNOSIS — I472 Ventricular tachycardia: Secondary | ICD-10-CM | POA: Diagnosis not present

## 2017-10-17 DIAGNOSIS — I5032 Chronic diastolic (congestive) heart failure: Secondary | ICD-10-CM | POA: Insufficient documentation

## 2017-10-17 DIAGNOSIS — Z951 Presence of aortocoronary bypass graft: Secondary | ICD-10-CM | POA: Insufficient documentation

## 2017-10-17 DIAGNOSIS — I4729 Other ventricular tachycardia: Secondary | ICD-10-CM

## 2017-10-17 DIAGNOSIS — I25119 Atherosclerotic heart disease of native coronary artery with unspecified angina pectoris: Secondary | ICD-10-CM

## 2017-10-17 DIAGNOSIS — E119 Type 2 diabetes mellitus without complications: Secondary | ICD-10-CM | POA: Diagnosis not present

## 2017-10-17 DIAGNOSIS — I110 Hypertensive heart disease with heart failure: Secondary | ICD-10-CM | POA: Insufficient documentation

## 2017-10-17 DIAGNOSIS — I10 Essential (primary) hypertension: Secondary | ICD-10-CM

## 2017-10-17 DIAGNOSIS — I5043 Acute on chronic combined systolic (congestive) and diastolic (congestive) heart failure: Secondary | ICD-10-CM

## 2017-10-17 DIAGNOSIS — R0600 Dyspnea, unspecified: Secondary | ICD-10-CM | POA: Diagnosis present

## 2017-10-17 LAB — MYOCARDIAL PERFUSION IMAGING
CHL CUP NUCLEAR SRS: 10
CHL CUP NUCLEAR SSS: 17
CSEPPHR: 95 {beats}/min
LV dias vol: 52 mL (ref 46–106)
LV sys vol: 19 mL
RATE: 0.32
Rest HR: 80 {beats}/min
SDS: 7
TID: 0.95

## 2017-10-17 LAB — BASIC METABOLIC PANEL
BUN / CREAT RATIO: 11 — AB (ref 12–28)
BUN: 12 mg/dL (ref 8–27)
CO2: 21 mmol/L (ref 20–29)
CREATININE: 1.06 mg/dL — AB (ref 0.57–1.00)
Calcium: 9 mg/dL (ref 8.7–10.3)
Chloride: 105 mmol/L (ref 96–106)
GFR calc Af Amer: 60 mL/min/{1.73_m2} (ref >59)
GFR, EST NON AFRICAN AMERICAN: 52 mL/min/{1.73_m2} — AB (ref >59)
Glucose: 227 mg/dL — ABNORMAL HIGH (ref 65–99)
Potassium: 4.2 mmol/L (ref 3.5–5.2)
Sodium: 140 mmol/L (ref 134–144)

## 2017-10-17 LAB — LIPID PANEL
CHOL/HDL RATIO: 3.3 ratio (ref 0.0–4.4)
Cholesterol, Total: 106 mg/dL (ref 100–199)
HDL: 32 mg/dL — AB (ref >39)
LDL CALC: 44 mg/dL (ref 0–99)
Triglycerides: 152 mg/dL — ABNORMAL HIGH (ref 0–149)
VLDL CHOLESTEROL CAL: 30 mg/dL (ref 5–40)

## 2017-10-17 LAB — HEPATIC FUNCTION PANEL
ALT: 15 IU/L (ref 0–32)
AST: 12 IU/L (ref 0–40)
Albumin: 4 g/dL (ref 3.5–4.8)
Alkaline Phosphatase: 82 IU/L (ref 39–117)
Bilirubin Total: 0.3 mg/dL (ref 0.0–1.2)
Bilirubin, Direct: 0.11 mg/dL (ref 0.00–0.40)
TOTAL PROTEIN: 6.1 g/dL (ref 6.0–8.5)

## 2017-10-17 MED ORDER — TECHNETIUM TC 99M TETROFOSMIN IV KIT
31.6000 | PACK | Freq: Once | INTRAVENOUS | Status: AC | PRN
  Administered 2017-10-17: 31.6 via INTRAVENOUS
  Filled 2017-10-17: qty 32

## 2017-10-17 MED ORDER — TECHNETIUM TC 99M TETROFOSMIN IV KIT
10.2000 | PACK | Freq: Once | INTRAVENOUS | Status: AC | PRN
  Administered 2017-10-17: 10.2 via INTRAVENOUS
  Filled 2017-10-17: qty 11

## 2017-10-17 MED ORDER — REGADENOSON 0.4 MG/5ML IV SOLN
0.4000 mg | Freq: Once | INTRAVENOUS | Status: AC
  Administered 2017-10-17: 0.4 mg via INTRAVENOUS

## 2017-10-20 ENCOUNTER — Telehealth: Payer: Self-pay

## 2017-10-20 NOTE — Telephone Encounter (Signed)
Notes recorded by Frederik Schmidt, RN on 10/20/2017 at 8:33 AM EDT LPMTCB 8/12 ------

## 2017-10-20 NOTE — Telephone Encounter (Signed)
Notes recorded by Frederik Schmidt, RN on 10/20/2017 at 1:31 PM EDT Spoke to patient and informed her of results/recommendations. She verbalized understanding. ------

## 2017-10-20 NOTE — Telephone Encounter (Signed)
-----   Message from Maria Margarita, MD sent at 10/18/2017  4:59 PM EDT ----- Patient has abnormal nuclear stress test with ischemia in the anterior wall.  In light of recent new SOB,patient needs to be set up for left heart catheterization.  Please have her come in for office visit with extender to get this set up.

## 2017-10-22 ENCOUNTER — Telehealth: Payer: Self-pay | Admitting: Cardiology

## 2017-10-22 NOTE — Telephone Encounter (Signed)
New Message:     Pt is returning a call for labs

## 2017-10-22 NOTE — Telephone Encounter (Signed)
LVM per DPR with normal lab results per Dr Theodosia Blender note.

## 2017-10-23 ENCOUNTER — Ambulatory Visit: Payer: Medicare HMO | Admitting: Podiatry

## 2017-10-23 ENCOUNTER — Encounter: Payer: Self-pay | Admitting: Podiatry

## 2017-10-23 DIAGNOSIS — M79674 Pain in right toe(s): Secondary | ICD-10-CM

## 2017-10-23 DIAGNOSIS — M79675 Pain in left toe(s): Secondary | ICD-10-CM

## 2017-10-23 DIAGNOSIS — D689 Coagulation defect, unspecified: Secondary | ICD-10-CM

## 2017-10-23 DIAGNOSIS — B351 Tinea unguium: Secondary | ICD-10-CM | POA: Diagnosis not present

## 2017-10-24 NOTE — Progress Notes (Signed)
Subjective: 72 y.o. returns the office today for painful, elongated, thickened toenails which she cannot trim herself. Denies any redness or drainage around the nails. She states they are causing pain in her shoes. Denies any acute changes since last appointment and no new complaints today. Denies any systemic complaints such as fevers, chills, nausea, vomiting.   PCP: [name], MD A1c 7.1  She is on plavix.   Objective: AAO 3, NAD DP/PT pulses palpable, CRT less than 3 seconds Nails hypertrophic, dystrophic, elongated, brittle, discolored 10. There is tenderness overlying the nails 1-5 bilaterally. There is no surrounding erythema or drainage along the nail sites. No open lesions or pre-ulcerative lesions are identified. No other areas of tenderness bilateral lower extremities. No overlying edema, erythema, increased warmth. No pain with calf compression, swelling, warmth, erythema.  Assessment: Patient presents with symptomatic onychomycosis  Plan: -Treatment options including alternatives, risks, complications were discussed -Nails sharply debrided 10 without complication/bleeding. -Discussed daily foot inspection. If there are any changes, to call the office immediately.  -Follow-up in 3 months or sooner if any problems are to arise. In the meantime, encouraged to call the office with any questions, concerns, changes symptoms.  Celesta Gentile, DPM

## 2017-10-31 ENCOUNTER — Ambulatory Visit: Payer: Medicare HMO | Admitting: Podiatry

## 2017-11-04 ENCOUNTER — Ambulatory Visit (INDEPENDENT_AMBULATORY_CARE_PROVIDER_SITE_OTHER): Payer: Medicare HMO | Admitting: Physician Assistant

## 2017-11-04 ENCOUNTER — Encounter: Payer: Self-pay | Admitting: Physician Assistant

## 2017-11-04 VITALS — BP 122/78 | HR 82 | Ht 63.0 in | Wt 158.4 lb

## 2017-11-04 DIAGNOSIS — I25810 Atherosclerosis of coronary artery bypass graft(s) without angina pectoris: Secondary | ICD-10-CM | POA: Diagnosis not present

## 2017-11-04 DIAGNOSIS — IMO0002 Reserved for concepts with insufficient information to code with codable children: Secondary | ICD-10-CM

## 2017-11-04 DIAGNOSIS — R9439 Abnormal result of other cardiovascular function study: Secondary | ICD-10-CM

## 2017-11-04 DIAGNOSIS — I10 Essential (primary) hypertension: Secondary | ICD-10-CM | POA: Diagnosis not present

## 2017-11-04 DIAGNOSIS — E1149 Type 2 diabetes mellitus with other diabetic neurological complication: Secondary | ICD-10-CM

## 2017-11-04 DIAGNOSIS — R0600 Dyspnea, unspecified: Secondary | ICD-10-CM | POA: Insufficient documentation

## 2017-11-04 DIAGNOSIS — E1165 Type 2 diabetes mellitus with hyperglycemia: Secondary | ICD-10-CM

## 2017-11-04 DIAGNOSIS — I4729 Other ventricular tachycardia: Secondary | ICD-10-CM

## 2017-11-04 DIAGNOSIS — I472 Ventricular tachycardia: Secondary | ICD-10-CM

## 2017-11-04 DIAGNOSIS — R0609 Other forms of dyspnea: Secondary | ICD-10-CM | POA: Diagnosis not present
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
## 2017-11-04 NOTE — Patient Instructions (Addendum)
Medication Instructions:  Your physician recommends that you continue on your current medications as directed. Please refer to the Current Medication list given to you today.   Labwork: None ordere  Testing/Procedures: None ordered  Follow up: Your physician recommends that you schedule a follow-up appointment in: 2 MONTHS WITH DR. Radford Pax    CALL OUR OFFICE IF YOU DECIDED TO HAVE THE PROCEDURE DONE.  HEART CATH  If you need a refill on your cardiac medications before your next appointment, please call your pharmacy.
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
## 2017-11-05 ENCOUNTER — Telehealth: Payer: Self-pay | Admitting: Physician Assistant

## 2017-11-05 NOTE — Telephone Encounter (Signed)
lpmtcb 8/28

## 2017-11-05 NOTE — Telephone Encounter (Signed)
New Message   Pt states that she is calling to reschedule her cath she was suppose to have.Wants to do it next week. Please call

## 2017-11-06 NOTE — Telephone Encounter (Signed)
Pt returned my call and she will come by the office tomorrow, Friday, 11/07/17, to go over cath instructions and pick up a copy.

## 2017-11-06 NOTE — Addendum Note (Signed)
Addended by: Imogene Burn on: 11/06/2017 08:15 AM   Modules accepted: Orders, SmartSet

## 2017-11-06 NOTE — Telephone Encounter (Signed)
Called pt to schedule to discuss her heart cath and instructions. Left a message for pt to call back.    [email or web address] @ [facility] OFFICE [place], [place] [address] Dept: [phone] Loc: [place]  11/06/2017  You are scheduled for a Cardiac Catheterization on Wednesday, September 4 with Dr. [name].  1. Please arrive at the [facility] (Main Entrance A) at [facility]: [address] [place], [place] [address] at 10:30 AM (This time is two hours before your procedure to ensure your preparation). Free valet parking service is available.   Special note: Every effort is made to have your procedure done on time. Please understand that emergencies sometimes delay scheduled procedures.  2. Diet: Do not eat solid foods after midnight.  The patient may have clear liquids until 5am upon the day of the procedure.  3. Labs: Have already been drawn  4. Medication instructions in preparation for your procedure:   Contrast Allergy: No    Stop taking, Lasix (Furosemide)  on Wednesday, September 4.    Stop Taking PO Diabetes Meds Glucophage (Metformin)on Wednesday, September 4.can restart 48 hours after the procedure   Stop taking the Glimepiride on Wednesday, November 12, 2017 and can restart on 11/13/17  On the morning of your procedure, take your Aspirin & Plavix and any morning medicines NOT listed above.  You may use sips of water.  5. Plan for one night stay--bring personal belongings. 6. Bring a current list of your medications and current insurance cards. 7. You MUST have a responsible person to drive you home. 8. Someone MUST be with you the first 24 hours after you arrive home or your discharge will be delayed. 9. Please wear clothes that are easy to get on and off and wear slip-on shoes.  Thank you for allowing [place] to care for you!   -- [place]  [facility]

## 2017-11-06 NOTE — Telephone Encounter (Signed)
Jennifer, Yes, I did discuss the risk of cath and can addend my note and write orders. She had labs 8/9 so should be good on that. Cath is for dyspnea on exertion and abnormal stress test. Please let me know when it is and I'll write orders. You can call/text me to let me know. Thanks, Selinda Eon

## 2017-11-07 ENCOUNTER — Encounter (HOSPITAL_COMMUNITY): Payer: Self-pay

## 2017-11-07 ENCOUNTER — Ambulatory Visit (HOSPITAL_COMMUNITY): Admit: 2017-11-07 | Payer: Medicare HMO | Admitting: Cardiovascular Disease

## 2017-11-07 SURGERY — LEFT HEART CATH AND CORONARY ANGIOGRAPHY
Anesthesia: LOCAL

## 2017-11-11 ENCOUNTER — Telehealth: Payer: Self-pay

## 2017-11-11 NOTE — Telephone Encounter (Signed)
Patient contacted pre-catheterization at [facility] scheduled for:  11/12/2017 at 12:30 pm Verified arrival time and place:  Arrive at 10:30 am Confirmed AM meds to be taken pre-cath with sip of water: Take aspirin/plavix Hold Metformin, amaryl and lasix Confirmed patient has responsible person to drive home post procedure and observe patient for 24 hours:  yes

## 2017-11-12 ENCOUNTER — Ambulatory Visit (HOSPITAL_COMMUNITY)
Admission: RE | Disposition: A | Discharge status: Home / Self Care | Payer: Self-pay | Source: Ambulatory Visit | Attending: Cardiovascular Disease

## 2017-11-12 ENCOUNTER — Ambulatory Visit (HOSPITAL_COMMUNITY)
Admission: RE | Admit: 2017-11-12 | Discharge: 2017-11-13 | Disposition: A | Discharge status: Home / Self Care | Payer: Medicare HMO | Source: Ambulatory Visit | Attending: Cardiovascular Disease | Admitting: Cardiovascular Disease

## 2017-11-12 DIAGNOSIS — I5032 Chronic diastolic (congestive) heart failure: Secondary | ICD-10-CM | POA: Diagnosis present

## 2017-11-12 DIAGNOSIS — Z8673 Personal history of transient ischemic attack (TIA), and cerebral infarction without residual deficits: Secondary | ICD-10-CM | POA: Insufficient documentation

## 2017-11-12 DIAGNOSIS — Z91018 Allergy to other foods: Secondary | ICD-10-CM | POA: Insufficient documentation

## 2017-11-12 DIAGNOSIS — Z7902 Long term (current) use of antithrombotics/antiplatelets: Secondary | ICD-10-CM | POA: Diagnosis not present

## 2017-11-12 DIAGNOSIS — R0609 Other forms of dyspnea: Secondary | ICD-10-CM | POA: Diagnosis not present

## 2017-11-12 DIAGNOSIS — Z882 Allergy status to sulfonamides status: Secondary | ICD-10-CM | POA: Insufficient documentation

## 2017-11-12 DIAGNOSIS — J90 Pleural effusion, not elsewhere classified: Secondary | ICD-10-CM | POA: Insufficient documentation

## 2017-11-12 DIAGNOSIS — E1149 Type 2 diabetes mellitus with other diabetic neurological complication: Secondary | ICD-10-CM | POA: Diagnosis not present

## 2017-11-12 DIAGNOSIS — I208 Other forms of angina pectoris: Secondary | ICD-10-CM | POA: Diagnosis present

## 2017-11-12 DIAGNOSIS — Z8249 Family history of ischemic heart disease and other diseases of the circulatory system: Secondary | ICD-10-CM | POA: Insufficient documentation

## 2017-11-12 DIAGNOSIS — Z79899 Other long term (current) drug therapy: Secondary | ICD-10-CM | POA: Insufficient documentation

## 2017-11-12 DIAGNOSIS — R0600 Dyspnea, unspecified: Secondary | ICD-10-CM

## 2017-11-12 DIAGNOSIS — I252 Old myocardial infarction: Secondary | ICD-10-CM | POA: Insufficient documentation

## 2017-11-12 DIAGNOSIS — Z8619 Personal history of other infectious and parasitic diseases: Secondary | ICD-10-CM | POA: Insufficient documentation

## 2017-11-12 DIAGNOSIS — I25118 Atherosclerotic heart disease of native coronary artery with other forms of angina pectoris: Secondary | ICD-10-CM | POA: Diagnosis not present

## 2017-11-12 DIAGNOSIS — I10 Essential (primary) hypertension: Secondary | ICD-10-CM | POA: Diagnosis present

## 2017-11-12 DIAGNOSIS — Z7982 Long term (current) use of aspirin: Secondary | ICD-10-CM | POA: Diagnosis not present

## 2017-11-12 DIAGNOSIS — Z7984 Long term (current) use of oral hypoglycemic drugs: Secondary | ICD-10-CM | POA: Insufficient documentation

## 2017-11-12 DIAGNOSIS — I2089 Other forms of angina pectoris: Secondary | ICD-10-CM | POA: Diagnosis present

## 2017-11-12 DIAGNOSIS — Z881 Allergy status to other antibiotic agents status: Secondary | ICD-10-CM | POA: Insufficient documentation

## 2017-11-12 DIAGNOSIS — I2583 Coronary atherosclerosis due to lipid rich plaque: Secondary | ICD-10-CM

## 2017-11-12 DIAGNOSIS — Z951 Presence of aortocoronary bypass graft: Secondary | ICD-10-CM | POA: Diagnosis not present

## 2017-11-12 DIAGNOSIS — I951 Orthostatic hypotension: Secondary | ICD-10-CM | POA: Insufficient documentation

## 2017-11-12 DIAGNOSIS — E1165 Type 2 diabetes mellitus with hyperglycemia: Secondary | ICD-10-CM | POA: Insufficient documentation

## 2017-11-12 DIAGNOSIS — I110 Hypertensive heart disease with heart failure: Secondary | ICD-10-CM | POA: Diagnosis not present

## 2017-11-12 DIAGNOSIS — Z833 Family history of diabetes mellitus: Secondary | ICD-10-CM | POA: Insufficient documentation

## 2017-11-12 DIAGNOSIS — R9439 Abnormal result of other cardiovascular function study: Secondary | ICD-10-CM

## 2017-11-12 DIAGNOSIS — I472 Ventricular tachycardia: Secondary | ICD-10-CM | POA: Insufficient documentation

## 2017-11-12 DIAGNOSIS — I6523 Occlusion and stenosis of bilateral carotid arteries: Secondary | ICD-10-CM | POA: Insufficient documentation

## 2017-11-12 DIAGNOSIS — Z87828 Personal history of other (healed) physical injury and trauma: Secondary | ICD-10-CM | POA: Insufficient documentation

## 2017-11-12 DIAGNOSIS — E1169 Type 2 diabetes mellitus with other specified complication: Secondary | ICD-10-CM | POA: Diagnosis present

## 2017-11-12 DIAGNOSIS — E785 Hyperlipidemia, unspecified: Secondary | ICD-10-CM | POA: Insufficient documentation

## 2017-11-12 DIAGNOSIS — Z885 Allergy status to narcotic agent status: Secondary | ICD-10-CM | POA: Insufficient documentation

## 2017-11-12 DIAGNOSIS — Z880 Allergy status to penicillin: Secondary | ICD-10-CM | POA: Insufficient documentation

## 2017-11-12 DIAGNOSIS — I2510 Atherosclerotic heart disease of native coronary artery without angina pectoris: Secondary | ICD-10-CM

## 2017-11-12 DIAGNOSIS — Z9889 Other specified postprocedural states: Secondary | ICD-10-CM | POA: Insufficient documentation

## 2017-11-12 DIAGNOSIS — Z9841 Cataract extraction status, right eye: Secondary | ICD-10-CM | POA: Insufficient documentation

## 2017-11-12 DIAGNOSIS — Z955 Presence of coronary angioplasty implant and graft: Secondary | ICD-10-CM

## 2017-11-12 HISTORY — PX: LEFT HEART CATH AND CORS/GRAFTS ANGIOGRAPHY: CATH118250

## 2017-11-12 HISTORY — PX: CORONARY STENT INTERVENTION: CATH118234

## 2017-11-12 LAB — GLUCOSE, CAPILLARY
Glucose-Capillary: 132 mg/dL — ABNORMAL HIGH (ref 70–99)
Glucose-Capillary: 196 mg/dL — ABNORMAL HIGH (ref 70–99)

## 2017-11-12 LAB — CBC
HEMATOCRIT: 36.9 % (ref 36.0–46.0)
HEMOGLOBIN: 12.2 g/dL (ref 12.0–15.0)
MCH: 31 pg (ref 26.0–34.0)
MCHC: 33.1 g/dL (ref 30.0–36.0)
MCV: 93.7 fL (ref 78.0–100.0)
Platelets: 196 10*3/uL (ref 150–400)
RBC: 3.94 MIL/uL (ref 3.87–5.11)
RDW: 12.4 % (ref 11.5–15.5)
WBC: 7.6 10*3/uL (ref 4.0–10.5)

## 2017-11-12 LAB — POCT ACTIVATED CLOTTING TIME: ACTIVATED CLOTTING TIME: 241 s

## 2017-11-12 SURGERY — LEFT HEART CATH AND CORS/GRAFTS ANGIOGRAPHY
Anesthesia: LOCAL

## 2017-11-12 MED ORDER — CLOPIDOGREL BISULFATE 300 MG PO TABS
ORAL_TABLET | ORAL | Status: DC | PRN
  Administered 2017-11-12: 600 mg via ORAL

## 2017-11-12 MED ORDER — SODIUM CHLORIDE 0.9 % IV SOLN: 250.0000 mL | INTRAVENOUS | Status: DC | PRN

## 2017-11-12 MED ORDER — METOCLOPRAMIDE HCL 10 MG PO TABS
10.0000 mg | ORAL_TABLET | Freq: Four times a day (QID) | ORAL | Status: DC | PRN
  Filled 2017-11-12: qty 1

## 2017-11-12 MED ORDER — MIDAZOLAM HCL 2 MG/2ML IJ SOLN
INTRAMUSCULAR | Status: AC
  Filled 2017-11-12: qty 2

## 2017-11-12 MED ORDER — HEPARIN SODIUM (PORCINE) 1000 UNIT/ML IJ SOLN
INTRAMUSCULAR | Status: AC
  Filled 2017-11-12: qty 1

## 2017-11-12 MED ORDER — SODIUM CHLORIDE 0.9 % WEIGHT BASED INFUSION
1.0000 mL/kg/h | INTRAVENOUS | Status: AC
  Administered 2017-11-12: 22:00:00 1 mL/kg/h via INTRAVENOUS

## 2017-11-12 MED ORDER — FUROSEMIDE 20 MG PO TABS
20.0000 mg | ORAL_TABLET | Freq: Every day | ORAL | Status: DC
  Administered 2017-11-13: 20 mg via ORAL
  Filled 2017-11-12 (×2): qty 1

## 2017-11-12 MED ORDER — ONDANSETRON HCL 4 MG/2ML IJ SOLN: 4.0000 mg | Freq: Four times a day (QID) | INTRAMUSCULAR | Status: DC | PRN

## 2017-11-12 MED ORDER — DOCUSATE SODIUM 100 MG PO CAPS
100.0000 mg | ORAL_CAPSULE | Freq: Every day | ORAL | Status: DC
  Administered 2017-11-12 – 2017-11-13 (×2): 100 mg via ORAL
  Filled 2017-11-12 (×2): qty 1

## 2017-11-12 MED ORDER — ATORVASTATIN CALCIUM 40 MG PO TABS
40.0000 mg | ORAL_TABLET | Freq: Every day | ORAL | Status: DC
  Administered 2017-11-12: 40 mg via ORAL
  Filled 2017-11-12: qty 1

## 2017-11-12 MED ORDER — SODIUM CHLORIDE 0.9% FLUSH: 3.0000 mL | INTRAVENOUS | Status: DC | PRN

## 2017-11-12 MED ORDER — SODIUM CHLORIDE 0.9% FLUSH: 3.0000 mL | Freq: Two times a day (BID) | INTRAVENOUS | Status: DC

## 2017-11-12 MED ORDER — PANTOPRAZOLE SODIUM 20 MG PO TBEC
20.0000 mg | DELAYED_RELEASE_TABLET | Freq: Every day | ORAL | Status: DC
  Administered 2017-11-12 – 2017-11-13 (×2): 20 mg via ORAL
  Filled 2017-11-12 (×2): qty 1

## 2017-11-12 MED ORDER — METOPROLOL TARTRATE 25 MG PO TABS
25.0000 mg | ORAL_TABLET | Freq: Two times a day (BID) | ORAL | Status: DC
  Administered 2017-11-12 – 2017-11-13 (×2): 25 mg via ORAL
  Filled 2017-11-12 (×3): qty 1

## 2017-11-12 MED ORDER — IOHEXOL 350 MG/ML SOLN
INTRAVENOUS | Status: DC | PRN
  Administered 2017-11-12: 175 mL via INTRA_ARTERIAL

## 2017-11-12 MED ORDER — ANGIOPLASTY BOOK
Freq: Once | Status: DC
  Filled 2017-11-12: qty 1

## 2017-11-12 MED ORDER — CLOPIDOGREL BISULFATE 300 MG PO TABS
ORAL_TABLET | ORAL | Status: AC
  Filled 2017-11-12: qty 2

## 2017-11-12 MED ORDER — CLOPIDOGREL BISULFATE 75 MG PO TABS
75.0000 mg | ORAL_TABLET | Freq: Every day | ORAL | Status: DC
  Administered 2017-11-13: 10:00:00 75 mg via ORAL
  Filled 2017-11-12: qty 1

## 2017-11-12 MED ORDER — NITROGLYCERIN 1 MG/10 ML FOR IR/CATH LAB
INTRA_ARTERIAL | Status: AC
  Filled 2017-11-12: qty 10

## 2017-11-12 MED ORDER — GLIMEPIRIDE 1 MG PO TABS
1.0000 mg | ORAL_TABLET | Freq: Every day | ORAL | Status: DC
  Administered 2017-11-13: 07:00:00 1 mg via ORAL
  Filled 2017-11-12: qty 1

## 2017-11-12 MED ORDER — SODIUM CHLORIDE 0.9% FLUSH
3.0000 mL | Freq: Two times a day (BID) | INTRAVENOUS | Status: DC
  Administered 2017-11-13: 3 mL via INTRAVENOUS

## 2017-11-12 MED ORDER — SODIUM CHLORIDE 0.9 % WEIGHT BASED INFUSION
3.0000 mL/kg/h | INTRAVENOUS | Status: DC
  Administered 2017-11-12: 3 mL/kg/h via INTRAVENOUS

## 2017-11-12 MED ORDER — GABAPENTIN 100 MG PO CAPS
100.0000 mg | ORAL_CAPSULE | Freq: Three times a day (TID) | ORAL | Status: DC
  Administered 2017-11-12 – 2017-11-13 (×3): 100 mg via ORAL
  Filled 2017-11-12 (×3): qty 1

## 2017-11-12 MED ORDER — HEPARIN (PORCINE) IN NACL 1000-0.9 UT/500ML-% IV SOLN
INTRAVENOUS | Status: DC | PRN
  Administered 2017-11-12 (×2): 500 mL

## 2017-11-12 MED ORDER — SODIUM CHLORIDE 0.9 % WEIGHT BASED INFUSION: 1.0000 mL/kg/h | INTRAVENOUS | Status: DC

## 2017-11-12 MED ORDER — NITROGLYCERIN 1 MG/10 ML FOR IR/CATH LAB
INTRA_ARTERIAL | Status: DC | PRN
  Administered 2017-11-12: 200 ug via INTRACORONARY

## 2017-11-12 MED ORDER — LIDOCAINE HCL (PF) 1 % IJ SOLN
INTRAMUSCULAR | Status: AC
  Filled 2017-11-12: qty 30

## 2017-11-12 MED ORDER — FENTANYL CITRATE (PF) 100 MCG/2ML IJ SOLN
INTRAMUSCULAR | Status: DC | PRN
  Administered 2017-11-12 (×3): 25 ug via INTRAVENOUS

## 2017-11-12 MED ORDER — ACETAMINOPHEN 325 MG PO TABS
650.0000 mg | ORAL_TABLET | ORAL | Status: DC | PRN
  Administered 2017-11-12: 650 mg via ORAL
  Filled 2017-11-12: qty 2

## 2017-11-12 MED ORDER — ASPIRIN 81 MG PO CHEW: 81.0000 mg | CHEWABLE_TABLET | ORAL | Status: DC

## 2017-11-12 MED ORDER — HEPARIN (PORCINE) IN NACL 1000-0.9 UT/500ML-% IV SOLN
INTRAVENOUS | Status: AC
  Filled 2017-11-12: qty 1000

## 2017-11-12 MED ORDER — HEPARIN SODIUM (PORCINE) 1000 UNIT/ML IJ SOLN
INTRAMUSCULAR | Status: DC | PRN
  Administered 2017-11-12: 2000 [IU] via INTRAVENOUS
  Administered 2017-11-12: 3000 [IU] via INTRAVENOUS
  Administered 2017-11-12: 4000 [IU] via INTRAVENOUS

## 2017-11-12 MED ORDER — ASPIRIN EC 81 MG PO TBEC
81.0000 mg | DELAYED_RELEASE_TABLET | Freq: Every day | ORAL | Status: DC
  Administered 2017-11-13: 81 mg via ORAL
  Filled 2017-11-12: qty 1

## 2017-11-12 MED ORDER — FENTANYL CITRATE (PF) 100 MCG/2ML IJ SOLN
INTRAMUSCULAR | Status: AC
  Filled 2017-11-12: qty 2

## 2017-11-12 MED ORDER — MIDAZOLAM HCL 2 MG/2ML IJ SOLN
INTRAMUSCULAR | Status: DC | PRN
  Administered 2017-11-12: 1 mg via INTRAVENOUS

## 2017-11-12 MED ORDER — VERAPAMIL HCL 2.5 MG/ML IV SOLN
INTRAVENOUS | Status: AC
  Filled 2017-11-12: qty 2

## 2017-11-12 MED ORDER — TRAZODONE HCL 50 MG PO TABS: 75.0000 mg | ORAL_TABLET | Freq: Every evening | ORAL | Status: DC | PRN

## 2017-11-12 SURGICAL SUPPLY — 17 items
BALLN SAPPHIRE ~~LOC~~ 3.25X12 (BALLOONS) ×1 IMPLANT
CATH INFINITI 5 FR IM (CATHETERS) ×1 IMPLANT
CATH INFINITI 5FR ANG PIGTAIL (CATHETERS) ×1 IMPLANT
CATH OPTITORQUE TIG 4.0 5F (CATHETERS) ×1 IMPLANT
CATH VISTA GUIDE 6FR XB3.5 (CATHETERS) ×1 IMPLANT
DEVICE RAD COMP TR BAND LRG (VASCULAR PRODUCTS) ×1 IMPLANT
GLIDESHEATH SLEND SS 6F .021 (SHEATH) ×1 IMPLANT
GUIDEWIRE INQWIRE 1.5J.035X260 (WIRE) IMPLANT
INQWIRE 1.5J .035X260CM (WIRE) ×3
KIT ENCORE 26 ADVANTAGE (KITS) ×1 IMPLANT
KIT HEART LEFT (KITS) ×3 IMPLANT
PACK CARDIAC CATHETERIZATION (CUSTOM PROCEDURE TRAY) ×3 IMPLANT
STENT SIERRA 3.00 X 15 MM (Permanent Stent) ×1 IMPLANT
SYR MEDRAD MARK V 150ML (SYRINGE) ×3 IMPLANT
TRANSDUCER W/STOPCOCK (MISCELLANEOUS) ×3 IMPLANT
TUBING CIL FLEX 10 FLL-RA (TUBING) ×3 IMPLANT
WIRE RUNTHROUGH .014X180CM (WIRE) ×1 IMPLANT

## 2017-11-12 NOTE — Interval H&P Note (Signed)
Cath Lab Visit (complete for each Cath Lab visit)  Clinical Evaluation Leading to the Procedure:   ACS: No.  Non-ACS:    Anginal Classification: CCS III  Anti-ischemic medical therapy: Minimal Therapy (1 class of medications)  Non-Invasive Test Results: Low-risk stress test findings: cardiac mortality <1%/year  Prior CABG: Previous CABG      History and Physical Interval Note:  11/12/2017 1:50 PM  Maria Fox  has presented today for surgery, with the diagnosis of doe - abnormal stress test  The various methods of treatment have been discussed with the patient and family. After consideration of risks, benefits and other options for treatment, the patient has consented to  Procedure(s): LEFT HEART CATH AND CORS/GRAFTS ANGIOGRAPHY (N/A) as a surgical intervention .  The patient's history has been reviewed, patient examined, no change in status, stable for surgery.  I have reviewed the patient's chart and labs.  Questions were answered to the patient's satisfaction.     Maria Fox

## 2017-11-12 NOTE — Progress Notes (Signed)
Pt does not have anyone who can spend the night with her.  Gwynne Edinger aware

## 2017-11-13 ENCOUNTER — Encounter (HOSPITAL_COMMUNITY): Payer: Self-pay | Admitting: Cardiovascular Disease

## 2017-11-13 DIAGNOSIS — I2583 Coronary atherosclerosis due to lipid rich plaque: Secondary | ICD-10-CM

## 2017-11-13 DIAGNOSIS — R0609 Other forms of dyspnea: Secondary | ICD-10-CM

## 2017-11-13 DIAGNOSIS — E1165 Type 2 diabetes mellitus with hyperglycemia: Secondary | ICD-10-CM | POA: Diagnosis not present

## 2017-11-13 DIAGNOSIS — I252 Old myocardial infarction: Secondary | ICD-10-CM | POA: Diagnosis not present

## 2017-11-13 DIAGNOSIS — R9439 Abnormal result of other cardiovascular function study: Secondary | ICD-10-CM | POA: Diagnosis not present

## 2017-11-13 DIAGNOSIS — I2510 Atherosclerotic heart disease of native coronary artery without angina pectoris: Secondary | ICD-10-CM | POA: Diagnosis not present

## 2017-11-13 DIAGNOSIS — I110 Hypertensive heart disease with heart failure: Secondary | ICD-10-CM | POA: Diagnosis not present

## 2017-11-13 DIAGNOSIS — I5032 Chronic diastolic (congestive) heart failure: Secondary | ICD-10-CM | POA: Diagnosis not present

## 2017-11-13 DIAGNOSIS — E1149 Type 2 diabetes mellitus with other diabetic neurological complication: Secondary | ICD-10-CM | POA: Diagnosis not present

## 2017-11-13 DIAGNOSIS — I25118 Atherosclerotic heart disease of native coronary artery with other forms of angina pectoris: Secondary | ICD-10-CM | POA: Diagnosis not present

## 2017-11-13 DIAGNOSIS — E785 Hyperlipidemia, unspecified: Secondary | ICD-10-CM | POA: Diagnosis not present

## 2017-11-13 DIAGNOSIS — J90 Pleural effusion, not elsewhere classified: Secondary | ICD-10-CM | POA: Diagnosis not present

## 2017-11-13 LAB — BASIC METABOLIC PANEL
Anion gap: 10 (ref 5–15)
BUN: 18 mg/dL (ref 8–23)
CALCIUM: 8.3 mg/dL — AB (ref 8.9–10.3)
CO2: 20 mmol/L — ABNORMAL LOW (ref 22–32)
CREATININE: 1.28 mg/dL — AB (ref 0.44–1.00)
Chloride: 106 mmol/L (ref 98–111)
GFR, EST AFRICAN AMERICAN: 47 mL/min — AB (ref >60)
GFR, EST NON AFRICAN AMERICAN: 40 mL/min — AB (ref >60)
Glucose, Bld: 246 mg/dL — ABNORMAL HIGH (ref 70–99)
Potassium: 3.7 mmol/L (ref 3.5–5.1)
SODIUM: 136 mmol/L (ref 135–145)

## 2017-11-13 LAB — CBC
HCT: 33.1 % — ABNORMAL LOW (ref 36.0–46.0)
Hemoglobin: 11.2 g/dL — ABNORMAL LOW (ref 12.0–15.0)
MCH: 31.1 pg (ref 26.0–34.0)
MCHC: 33.8 g/dL (ref 30.0–36.0)
MCV: 91.9 fL (ref 78.0–100.0)
PLATELETS: 177 10*3/uL (ref 150–400)
RBC: 3.6 MIL/uL — AB (ref 3.87–5.11)
RDW: 12.4 % (ref 11.5–15.5)
WBC: 6.2 10*3/uL (ref 4.0–10.5)

## 2017-11-13 LAB — GLUCOSE, CAPILLARY
GLUCOSE-CAPILLARY: 206 mg/dL — AB (ref 70–99)
Glucose-Capillary: 233 mg/dL — ABNORMAL HIGH (ref 70–99)

## 2017-11-13 MED ORDER — GI COCKTAIL ~~LOC~~
30.0000 mL | Freq: Once | ORAL | Status: AC
  Administered 2017-11-13: 30 mL via ORAL
  Filled 2017-11-13: qty 30

## 2017-11-13 NOTE — Progress Notes (Signed)
CARDIAC REHAB PHASE I   PRE:  Rate/Rhythm: 77 SR  BP:  Sitting: 144/55      SaO2: 97 RA  MODE:  Ambulation: 500 ft Peak HR  POST:  Rate/Rhythm: 81 SR  BP:  Sitting: 167/62    SaO2: 96 RA   Pt ambulated 520ft in hallway assist of one with weaving gait. Pt states her legs feel weak, some SOB after walk. Pt and family member educated on importance of Plavix, ASA, statin, and NTG. Pt given heart healthy and diabetic diets. Stent card at bedside. Reviewed restrictions and exercise guidelines. Will refer to CRP II [place].  [id number] Rufina Falco, RN BSN 11/13/2017 9:34 AM

## 2017-11-13 NOTE — Discharge Summary (Signed)
Discharge Summary    Patient ID: KHRISTIAN SEALS,  MRN: [id number], DOB/AGE: [id number] 74 y.o.  Admit date: 11/12/2017 Discharge date: 11/13/2017  Primary Care Provider: [name] Primary Cardiologist: Fransico Him, MD  Discharge Diagnoses    Principal Problem:   Abnormal stress ECG Active Problems:   Hypertension, essential   HLD (hyperlipidemia)   DM (diabetes mellitus), type 2, uncontrolled w/neurologic complication ([name])   S/P CABG x 3   Diastolic CHF ([place])   Effort angina ([name])   Coronary artery disease due to lipid rich plaque  Diagnostic Studies/Procedures    Cardiac catheterization 11/12/2017:  The left ventricular systolic function is normal.  LV end diastolic pressure is mildly elevated.  The left ventricular ejection fraction is 55-65% by visual estimate.  Mid Cx lesion is 80% stenosed.  Post intervention, there is a 0% residual stenosis.  A drug-eluting stent was successfully placed using a STENT SIERRA 3.00 X 15 MM.  Prox LAD to Mid LAD lesion is 100% stenosed.  SVG graft was visualized by angiography.  Origin lesion is 100% stenosed.  LIMA graft was visualized by angiography and is normal in caliber.  The graft exhibits no disease.   1.  Significant underlying two-vessel coronary artery disease with occluded mid LAD and significant disease in mid left circumflex.  The RCA is relatively normal.  Patent LIMA to LAD.  Occluded SVG to OM 3.  I could not find any other patent graft even with aortogram.  I suspect that there is likely an SVG to diagonal which is also occluded. 2.  Normal LV systolic function and mildly elevated left ventricular end-diastolic pressure. 3.  Successful drug-eluting stent placement to the mid left circumflex.  Recommendations:  Recommend uninterrupted dual antiplatelet therapy with Aspirin 81mg  daily and Clopidogrel 75mg  daily for a minimum of 6 months (stable ischemic heart disease - Class I  recommendation).  History of Present Illness     Maria Fox is a 72 year old female with a history of nonsustained ventricular tachycardia on low-dose Toprol, CAD status post NSTEMI followed by CABG 05/2014, multifocal infarcts by MRI in the corona radiata bilaterally felt to have occurred during her CABG, chronic diastolic CHF not on diuretics due to orthostatic hypotension felt to be autonomic dysfunction from her DM who presented to [facility] on 10/12/2016 for elective cardiac catheterization secondary to abnormal nuclear stress test recent c/o dyspnea on exertion.   Of note, patient last saw Dr. Radford Pax on 10/10/1998 which time she was complaining of dyspnea on exertion occasionally with tightening.  Echocardiogram 10/17/2017 with normal LVEF 55- 50% with G1 DD.  Nuclear stress test performed on 10/17/2016 estimated LVEF of 64% with a small moderate severity mid anterior apical MI mild.  Dr. Radford Pax reviewed and recommended cardiac catheterization in light of recent dyspnea on exertion.  She was seen in follow-up on 11/06/2017 with complaints as well and almost 50 pound weight gain surgery.  Again cardiac catheterization was recommended, scheduled for 11/12/2017.  Hospital Course   On 11/12/17 patient presented to [facility] for elective cardiac catheterization which revealed significant underlying two-vessel coronary artery disease with occluded mid LAD and significant disease left circumflex.  The RCA was noted to be relatively normal.  She had a patent LIMA to LAD.  Occluded SVG to OM 3.  She underwent a successful drug-eluting stent to the mid left circumflex with recommendations for uninterrupted dual antiplatelet therapy with aspirin 81 mg daily and clopidogrel 75 mg daily 6 months.  Left  wrist cath site unremarkable. Post-procedural creatinine is mildly elevated at 1.28  however, this appears to be close to her baseline of 1.1-1.2 range. Hb stable at 11.2.  BP mildly elevated and okay to discharge per Dr. Radford Pax. She has  walked with cardiac rehabilitation without complication. No other concerns.   Consultants: None    General: Well developed, well nourished, NAD Skin: Warm, dry, intact  Head: Normocephalic, atraumatic, clear, moist mucus membranes. Neck: Negative for carotid bruits. No JVD Lungs:Clear to ausculation bilaterally. No wheezes, rales, or rhonchi. Breathing is unlabored. Cardiovascular: RRR with S1 S2. No murmurs, rubs, gallops, or LV heave appreciated. Abdomen: Soft, non-tender, non-distended with normoactive bowel sounds. No hepatomegaly, No rebound/guarding. No obvious abdominal masses. MSK: Strength and tone appear normal for age. 5/5 in all extremities Extremities: No edema. No clubbing or cyanosis. DP/PT pulses 2+ bilaterally Neuro: Alert and oriented. No focal deficits. No facial asymmetry. MAE spontaneously. Psych: Responds to questions appropriately with normal affect.   _____________  Discharge Vitals Blood pressure (!) 144/66, pulse 64, temperature (!) 97.5 F (36.4 C), resp. rate (!) 21, height 5\' 2"  (1.575 m), weight 73.1 kg, SpO2 97 %.  Filed Weights   11/12/17 1017 11/13/17 0055  Weight: 63.5 kg 73.1 kg   Labs & Radiologic Studies    CBC Recent Labs    11/12/17 1023 11/13/17 0301  WBC 7.6 6.2  HGB 12.2 11.2*  HCT 36.9 33.1*  MCV 93.7 91.9  PLT 196 195   Basic Metabolic Panel Recent Labs    11/13/17 0301  NA 136  K 3.7  CL 106  CO2 20*  GLUCOSE 246*  BUN 18  CREATININE 1.28*  CALCIUM 8.3*   Disposition   Pt is being discharged home today in good condition.  Follow-up Plans & Appointments    Follow-up Information    Imogene Burn, PA-C Follow up on 11/25/2017.   Specialty:  Cardiology Why:  Your follow up appointment will be on 11/25/17 at 11am.  Contact information: [address] [place] [address] [phone]          Discharge Instructions    AMB Referral to Cardiac Rehabilitation - Phase II   Complete by:  As  directed    Diagnosis:  Coronary Stents   Amb Referral to Cardiac Rehabilitation   Complete by:  As directed    Diagnosis:  Coronary Stents   Call MD for:  difficulty breathing, headache or visual disturbances   Complete by:  As directed    Call MD for:  persistant dizziness or light-headedness   Complete by:  As directed    Call MD for:  persistant nausea and vomiting   Complete by:  As directed    Call MD for:  redness, tenderness, or signs of infection (pain, swelling, redness, odor or green/yellow discharge around incision site)   Complete by:  As directed    Call MD for:  severe uncontrolled pain   Complete by:  As directed    Call MD for:  temperature >100.4   Complete by:  As directed    Diet - low sodium heart healthy   Complete by:  As directed    Diet - low sodium heart healthy   Complete by:  As directed    Discharge instructions   Complete by:  As directed    You will need to hold your Metformin until 11/14/17.   No driving for 3 days. No lifting over 5 lbs for  1 week. No sexual activity for 1 week. Keep procedure site clean & dry. If you notice increased pain, swelling, bleeding or pus, call/return!  You may shower, but no soaking baths/hot tubs/pools for 1 week.   PLEASE DO NOT MISS ANY DOSES OF YOUR PLAVIX!!!!! Also keep a log of you blood pressures and bring back to your follow up appt. Please call the office with any questions.   Patients taking blood thinners should generally stay away from medicines like ibuprofen, Advil, Motrin, naproxen, and Aleve due to risk of stomach bleeding. You may take Tylenol as directed or talk to your primary doctor about alternatives.  Some studies suggest Prilosec/Omeprazole interacts with Plavix. Use Protonix for less chance of interaction.   If you notice any bleeding such as blood in stool, black tarry stools, blood in urine, nosebleeds or any other unusual bleeding, call your doctor immediately.   Increase activity slowly    Complete by:  As directed    Increase activity slowly   Complete by:  As directed       Discharge Medications   Allergies as of 11/13/2017      Reactions   Mushroom Extract Complex Anaphylaxis   Codeine Nausea And Vomiting   Penicillins Hives, Swelling   Has patient had a PCN reaction causing immediate rash, facial/tongue/throat swelling, SOB or lightheadedness with hypotension: Yes Has patient had a PCN reaction causing severe rash involving mucus membranes or skin necrosis: Yes Has patient had a PCN reaction that required hospitalization Yes Has patient had a PCN reaction occurring within the last 10 years: No If all of the above answers are "NO", then may proceed with Cephalosporin use.   Sulfa Antibiotics Nausea And Vomiting   Erythromycin Base Rash      Medication List    TAKE these medications   aspirin EC 81 MG tablet Take 81 mg by mouth daily.   atorvastatin 40 MG tablet Commonly known as:  LIPITOR TAKE ONE TABLET BY MOUTH EVERY NIGHT AT BEDTIME   clopidogrel 75 MG tablet Commonly known as:  PLAVIX TAKE ONE TABLET BY MOUTH DAILY   docusate sodium 100 MG capsule Commonly known as:  COLACE Take 100 mg by mouth daily.   furosemide 20 MG tablet Commonly known as:  LASIX Take 1 tablet (20 mg total) by mouth daily.   gabapentin 100 MG capsule Commonly known as:  NEURONTIN Take one tablet in am, one in afternoon, three in evening   glimepiride 1 MG tablet Commonly known as:  AMARYL Take 1 tablet (1 mg total) by mouth daily before breakfast.   metFORMIN 500 MG tablet Commonly known as:  GLUCOPHAGE Take 1 tablet (500 mg total) by mouth 3 (three) times daily with meals. Notes to patient:  Restart on 11/14/17   metoCLOPramide 10 MG tablet Commonly known as:  REGLAN Take 10 mg by mouth every 6 (six) hours as needed for nausea or vomiting.   metoprolol tartrate 25 MG tablet Commonly known as:  LOPRESSOR TAKE ONE-HALF TABLET (12.5MG ) BY MOUTH DAILY What  changed:  See the new instructions.   pantoprazole 20 MG tablet Commonly known as:  PROTONIX Take 1 tablet (20 mg total) by mouth daily.   traZODone 50 MG tablet Commonly known as:  DESYREL Take 1.5 tablets (75 mg total) by mouth at bedtime as needed for sleep. What changed:  when to take this        Acute coronary syndrome (MI, NSTEMI, STEMI, etc) this admission?: Yes.  AHA/ACC Clinical Performance & Quality Measures: 1. Aspirin prescribed? - Yes 2. ADP Receptor Inhibitor (Plavix/Clopidogrel, Brilinta/Ticagrelor or Effient/Prasugrel) prescribed (includes medically managed patients)? - Yes 3. Beta Blocker prescribed? - Yes 4. High Intensity Statin (Lipitor 40-80mg  or Crestor 20-40mg ) prescribed? - Yes 5. EF assessed during THIS hospitalization? - Yes 6. For EF <40%, was ACEI/ARB prescribed? - Not Applicable (EF >/= 76%) 7. For EF <40%, Aldosterone Antagonist (Spironolactone or Eplerenone) prescribed? - Not Applicable (EF >/= 70%) 8. Cardiac Rehab Phase II ordered (Included Medically managed Patients)? - Yes    Outstanding Labs/Studies   None   Duration of Discharge Encounter   Greater than 30 minutes including physician time.  Signed, Kathyrn Drown, NP 11/13/2017, 12:24 PM

## 2017-11-13 NOTE — Progress Notes (Signed)
TR BAND REMOVAL  LOCATION:    left radial  DEFLATED PER PROTOCOL:    Yes.    TIME BAND OFF / DRESSING APPLIED:    2115   SITE UPON ARRIVAL:    Level 0  SITE AFTER BAND REMOVAL:    Level 0  CIRCULATION SENSATION AND MOVEMENT:    Within Normal Limits   Yes.    COMMENTS:   Post TRBand instructions given, good cap refill

## 2017-11-13 NOTE — Progress Notes (Signed)
Patient complained of indigestion at beginning of shift, EKG has been performed and placed on chart, no ST elevation noted. [facility] NP notified and ordered GI Cocktail, patient medicated per order. At this time, patient ambulating in hallway with cardiac rehab, tolerating well. States "I feel much better, no more indigestion."

## 2017-11-16 ENCOUNTER — Encounter: Payer: Self-pay | Admitting: Family Medicine

## 2017-11-17 ENCOUNTER — Telehealth (HOSPITAL_COMMUNITY): Payer: Self-pay

## 2017-11-17 MED FILL — Lidocaine HCl Local Preservative Free (PF) Inj 1%: INTRAMUSCULAR | Qty: 30 | Status: AC

## 2017-11-17 MED FILL — Verapamil HCl IV Soln 2.5 MG/ML: INTRAVENOUS | Qty: 2 | Status: AC

## 2017-11-17 NOTE — Telephone Encounter (Signed)
Pt insurance is active and benefits verified through [employer]. Co-pay $25.00, DED $0.00/$0.00 met, out of pocket $4,200.00/$459.58 met, co-insurance 0%. No pre-authorization. Passport, 11/17/17 @ 2:18PM, REF#[id number] °

## 2017-11-18 ENCOUNTER — Telehealth (HOSPITAL_COMMUNITY): Payer: Self-pay

## 2017-11-18 NOTE — Telephone Encounter (Signed)
Attempted to call patient in regards to Cardiac Rehab - LM on VM 

## 2017-11-19 ENCOUNTER — Ambulatory Visit: Payer: Self-pay | Admitting: Physician Assistant

## 2017-11-23 ENCOUNTER — Emergency Department (HOSPITAL_COMMUNITY)
Admission: EM | Admit: 2017-11-23 | Discharge: 2017-11-24 | Disposition: A | Discharge status: Home / Self Care | Payer: Medicare HMO | Attending: Emergency Medicine | Admitting: Emergency Medicine

## 2017-11-23 ENCOUNTER — Encounter (HOSPITAL_COMMUNITY): Payer: Self-pay | Admitting: Emergency Medicine

## 2017-11-23 ENCOUNTER — Emergency Department (HOSPITAL_COMMUNITY): Payer: Medicare HMO

## 2017-11-23 ENCOUNTER — Other Ambulatory Visit: Payer: Self-pay

## 2017-11-23 DIAGNOSIS — I252 Old myocardial infarction: Secondary | ICD-10-CM | POA: Diagnosis not present

## 2017-11-23 DIAGNOSIS — I110 Hypertensive heart disease with heart failure: Secondary | ICD-10-CM | POA: Diagnosis not present

## 2017-11-23 DIAGNOSIS — R1031 Right lower quadrant pain: Secondary | ICD-10-CM | POA: Diagnosis not present

## 2017-11-23 DIAGNOSIS — I2510 Atherosclerotic heart disease of native coronary artery without angina pectoris: Secondary | ICD-10-CM | POA: Diagnosis not present

## 2017-11-23 DIAGNOSIS — Z8673 Personal history of transient ischemic attack (TIA), and cerebral infarction without residual deficits: Secondary | ICD-10-CM | POA: Diagnosis not present

## 2017-11-23 DIAGNOSIS — K8020 Calculus of gallbladder without cholecystitis without obstruction: Secondary | ICD-10-CM | POA: Diagnosis not present

## 2017-11-23 DIAGNOSIS — E119 Type 2 diabetes mellitus without complications: Secondary | ICD-10-CM | POA: Diagnosis not present

## 2017-11-23 DIAGNOSIS — I5032 Chronic diastolic (congestive) heart failure: Secondary | ICD-10-CM | POA: Insufficient documentation

## 2017-11-23 DIAGNOSIS — E785 Hyperlipidemia, unspecified: Secondary | ICD-10-CM | POA: Diagnosis not present

## 2017-11-23 DIAGNOSIS — I4510 Unspecified right bundle-branch block: Secondary | ICD-10-CM | POA: Diagnosis not present

## 2017-11-23 DIAGNOSIS — N3000 Acute cystitis without hematuria: Secondary | ICD-10-CM | POA: Diagnosis not present

## 2017-11-23 DIAGNOSIS — R110 Nausea: Secondary | ICD-10-CM | POA: Diagnosis not present

## 2017-11-23 DIAGNOSIS — M79601 Pain in right arm: Secondary | ICD-10-CM

## 2017-11-23 LAB — COMPREHENSIVE METABOLIC PANEL
ALK PHOS: 91 U/L (ref 38–126)
ALT: 16 U/L (ref 0–44)
ANION GAP: 9 (ref 5–15)
AST: 18 U/L (ref 15–41)
Albumin: 3.7 g/dL (ref 3.5–5.0)
BUN: 19 mg/dL (ref 8–23)
CALCIUM: 8.7 mg/dL — AB (ref 8.9–10.3)
CO2: 21 mmol/L — ABNORMAL LOW (ref 22–32)
Chloride: 106 mmol/L (ref 98–111)
Creatinine, Ser: 1.59 mg/dL — ABNORMAL HIGH (ref 0.44–1.00)
GFR calc Af Amer: 36 mL/min — ABNORMAL LOW (ref >60)
GFR, EST NON AFRICAN AMERICAN: 31 mL/min — AB (ref >60)
Glucose, Bld: 262 mg/dL — ABNORMAL HIGH (ref 70–99)
Potassium: 3.9 mmol/L (ref 3.5–5.1)
Sodium: 136 mmol/L (ref 135–145)
TOTAL PROTEIN: 6.3 g/dL — AB (ref 6.5–8.1)
Total Bilirubin: 0.5 mg/dL (ref 0.3–1.2)

## 2017-11-23 LAB — CBC
HEMATOCRIT: 36.8 % (ref 36.0–46.0)
HEMOGLOBIN: 12 g/dL (ref 12.0–15.0)
MCH: 31 pg (ref 26.0–34.0)
MCHC: 32.6 g/dL (ref 30.0–36.0)
MCV: 95.1 fL (ref 78.0–100.0)
Platelets: 239 10*3/uL (ref 150–400)
RBC: 3.87 MIL/uL (ref 3.87–5.11)
RDW: 12.7 % (ref 11.5–15.5)
WBC: 7.9 10*3/uL (ref 4.0–10.5)

## 2017-11-23 LAB — URINALYSIS, ROUTINE W REFLEX MICROSCOPIC
BILIRUBIN URINE: NEGATIVE
GLUCOSE, UA: NEGATIVE mg/dL
Hgb urine dipstick: NEGATIVE
Ketones, ur: 5 mg/dL — AB
NITRITE: NEGATIVE
PH: 5 (ref 5.0–8.0)
Protein, ur: NEGATIVE mg/dL
SPECIFIC GRAVITY, URINE: 1.026 (ref 1.005–1.030)

## 2017-11-23 LAB — CBG MONITORING, ED: GLUCOSE-CAPILLARY: 249 mg/dL — AB (ref 70–99)

## 2017-11-23 LAB — LIPASE, BLOOD: Lipase: 43 U/L (ref 11–51)

## 2017-11-23 MED ORDER — SODIUM CHLORIDE 0.9 % IV SOLN: 1.0000 g | Freq: Once | INTRAVENOUS | Status: DC

## 2017-11-23 NOTE — ED Notes (Signed)
Informed RN Autumn unable to get blood sample

## 2017-11-23 NOTE — ED Notes (Signed)
Pt to CT via strecher
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
## 2017-11-23 NOTE — ED Triage Notes (Signed)
Pt presents with multiple complaints, thinks she was stung by a bug on Friday, c/o arm pain. Also c/o generalized abdominal pain with nausea/diarrhea. Cardiac stent placed last week. Denies chest pain/shortness of breath.

## 2017-11-23 NOTE — ED Notes (Signed)
Pt unable to void at this time. 

## 2017-11-24 DIAGNOSIS — R1031 Right lower quadrant pain: Secondary | ICD-10-CM | POA: Diagnosis not present

## 2017-11-24 DIAGNOSIS — I110 Hypertensive heart disease with heart failure: Secondary | ICD-10-CM | POA: Diagnosis not present

## 2017-11-24 DIAGNOSIS — N3000 Acute cystitis without hematuria: Secondary | ICD-10-CM | POA: Diagnosis not present

## 2017-11-24 DIAGNOSIS — I2510 Atherosclerotic heart disease of native coronary artery without angina pectoris: Secondary | ICD-10-CM | POA: Diagnosis not present

## 2017-11-24 DIAGNOSIS — M79601 Pain in right arm: Secondary | ICD-10-CM | POA: Diagnosis not present

## 2017-11-24 DIAGNOSIS — E119 Type 2 diabetes mellitus without complications: Secondary | ICD-10-CM | POA: Diagnosis not present

## 2017-11-24 DIAGNOSIS — E785 Hyperlipidemia, unspecified: Secondary | ICD-10-CM | POA: Diagnosis not present

## 2017-11-24 DIAGNOSIS — I5032 Chronic diastolic (congestive) heart failure: Secondary | ICD-10-CM | POA: Diagnosis not present

## 2017-11-24 DIAGNOSIS — Z8673 Personal history of transient ischemic attack (TIA), and cerebral infarction without residual deficits: Secondary | ICD-10-CM | POA: Diagnosis not present

## 2017-11-24 DIAGNOSIS — I252 Old myocardial infarction: Secondary | ICD-10-CM | POA: Diagnosis not present

## 2017-11-24 LAB — I-STAT TROPONIN, ED: Troponin i, poc: 0.01 ng/mL (ref 0.00–0.08)

## 2017-11-24 MED ORDER — CEPHALEXIN 250 MG PO CAPS
500.0000 mg | ORAL_CAPSULE | Freq: Once | ORAL | Status: AC
  Administered 2017-11-24: 500 mg via ORAL
  Filled 2017-11-24: qty 2

## 2017-11-24 MED ORDER — CEPHALEXIN 500 MG PO CAPS: 500.0000 mg | ORAL_CAPSULE | Freq: Four times a day (QID) | ORAL | 0 refills | Status: AC

## 2017-11-24 NOTE — Discharge Instructions (Addendum)
You have been seen in the Emergency Department (ED) today for pain when urinating.  Your workup today suggests that you have a urinary tract infection (UTI).  Please take your antibiotic as prescribed and over-the-counter pain medication (Tylenol or Motrin) as needed, but no more than recommended on the label instructions.  Drink PLENTY of fluids.  Call your regular doctor to schedule the next available appointment to follow up on todays ED visit, or return immediately to the ED if your pain worsens, you have decreased urine production, develop fever, persistent vomiting, or other symptoms that concern you.  Your cardiac labs were normal today.

## 2017-11-25 ENCOUNTER — Encounter: Payer: Self-pay | Admitting: Physician Assistant

## 2017-11-25 ENCOUNTER — Encounter (HOSPITAL_COMMUNITY): Payer: Self-pay

## 2017-11-25 ENCOUNTER — Ambulatory Visit (INDEPENDENT_AMBULATORY_CARE_PROVIDER_SITE_OTHER): Payer: Medicare HMO | Admitting: Physician Assistant

## 2017-11-25 VITALS — BP 112/72 | HR 85 | Ht 62.0 in | Wt 159.0 lb

## 2017-11-25 DIAGNOSIS — R0609 Other forms of dyspnea: Secondary | ICD-10-CM | POA: Diagnosis not present

## 2017-11-25 DIAGNOSIS — I5030 Unspecified diastolic (congestive) heart failure: Secondary | ICD-10-CM | POA: Diagnosis not present

## 2017-11-25 DIAGNOSIS — E782 Mixed hyperlipidemia: Secondary | ICD-10-CM

## 2017-11-25 DIAGNOSIS — I25810 Atherosclerosis of coronary artery bypass graft(s) without angina pectoris: Secondary | ICD-10-CM | POA: Diagnosis not present

## 2017-11-25 DIAGNOSIS — J984 Other disorders of lung: Secondary | ICD-10-CM | POA: Diagnosis not present

## 2017-11-25 DIAGNOSIS — I10 Essential (primary) hypertension: Secondary | ICD-10-CM

## 2017-11-25 DIAGNOSIS — R0600 Dyspnea, unspecified: Secondary | ICD-10-CM

## 2017-11-25 NOTE — Patient Instructions (Signed)
Medication Instructions:  Your physician recommends that you continue on your current medications as directed. Please refer to the Current Medication list given to you today.   Labwork: Your physician recommends that you return for lab work in: 2 weeks for BMET   Testing/Procedures: None ordered  Follow-Up: You have been referred to Pulmonology  Your physician recommends that you schedule a follow-up appointment with Dr. Radford Pax next available     Any Other Special Instructions Will Be Listed Below (If Applicable).     If you need a refill on your cardiac medications before your next appointment, please call your pharmacy.

## 2017-11-25 NOTE — Telephone Encounter (Signed)
Attempted to call pt a 2nd time- LM ON VM ° °Mailed letter out °
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
## 2017-11-26 ENCOUNTER — Encounter: Payer: Self-pay | Admitting: Family Medicine

## 2017-11-26 ENCOUNTER — Ambulatory Visit (INDEPENDENT_AMBULATORY_CARE_PROVIDER_SITE_OTHER): Payer: Medicare HMO | Admitting: Family Medicine

## 2017-11-26 VITALS — BP 120/80 | HR 86 | Temp 97.6°F | Ht 63.0 in | Wt 158.5 lb

## 2017-11-26 DIAGNOSIS — E1149 Type 2 diabetes mellitus with other diabetic neurological complication: Secondary | ICD-10-CM | POA: Diagnosis not present

## 2017-11-26 DIAGNOSIS — I10 Essential (primary) hypertension: Secondary | ICD-10-CM

## 2017-11-26 DIAGNOSIS — E1165 Type 2 diabetes mellitus with hyperglycemia: Secondary | ICD-10-CM

## 2017-11-26 DIAGNOSIS — Z23 Encounter for immunization: Secondary | ICD-10-CM

## 2017-11-26 DIAGNOSIS — I2510 Atherosclerotic heart disease of native coronary artery without angina pectoris: Secondary | ICD-10-CM

## 2017-11-26 DIAGNOSIS — IMO0002 Reserved for concepts with insufficient information to code with codable children: Secondary | ICD-10-CM

## 2017-11-26 DIAGNOSIS — N289 Disorder of kidney and ureter, unspecified: Secondary | ICD-10-CM | POA: Diagnosis not present

## 2017-11-26 DIAGNOSIS — I2583 Coronary atherosclerosis due to lipid rich plaque: Secondary | ICD-10-CM

## 2017-11-26 DIAGNOSIS — R258 Other abnormal involuntary movements: Secondary | ICD-10-CM | POA: Diagnosis not present

## 2017-11-26 LAB — POCT GLYCOSYLATED HEMOGLOBIN (HGB A1C): Hemoglobin A1C: 7.6 % — AB (ref 4.0–5.6)

## 2017-11-26 MED ORDER — EPINEPHRINE 0.3 MG/0.3ML IJ SOAJ: 0.3000 mg | Freq: Once | INTRAMUSCULAR | 1 refills | Status: AC

## 2017-11-26 MED ORDER — GABAPENTIN 100 MG PO CAPS: ORAL_CAPSULE | ORAL | 11 refills | Status: DC

## 2017-11-26 NOTE — Progress Notes (Signed)
BP 120/80 (BP Location: Left Arm, Patient Position: Sitting, Cuff Size: Normal)   Pulse 86   Temp 97.6 F (36.4 C) (Oral)   Ht 5\' 3"  (1.6 m)   Wt 158 lb 8 oz (71.9 kg)   SpO2 98%   BMI 28.08 kg/m    CC: 3 mo f/u visit Subjective:    Patient ID: Maria Fox, female    DOB: Jul 31, 1945, 72 y.o.   MRN: [id number]  HPI: Maria Fox is a 72 y.o. female presenting on 11/26/2017 for 3 mo follow up (Requesting rx for EpiPen.) and Other (C/o of bilateral legs jumping.)   Legs continue jumping at night time. Endorses restful sleep. No significant RLS symptoms. This bothers her but doesn't keep her awake.   Requests Rx epi pen for bee allergy.   Recent hospitalization with stent placement. Planning to see pulm for scarring of lungs - known emphysema. Significant second hand smoke exposure. She had ER visit earlier this week with acute cystitis. Feeling better.   DM - does regularly check sugars twice daily - forgot log. Endorses fasting readings 70s. No appetite. Compliant with antihyperglycemic regimen which includes: metformin 500mg  tid, amaryl 1mg  daily. Denies low sugars or hypoglycemic symptoms. Denies paresthesias. Last diabetic eye exam DUE. Pneumovax: 2012. Prevnar: 2018. Glucometer brand: ?accucheck. DSME: has not completed. Lab Results  Component Value Date   HGBA1C 7.6 (A) 11/26/2017   Diabetic Foot Exam - Simple   No data filed     Lab Results  Component Value Date   MICROALBUR 0.7 08/26/2016     Relevant past medical, surgical, family and social history reviewed and updated as indicated. Interim medical history since our last visit reviewed. Allergies and medications reviewed and updated. Outpatient Medications Prior to Visit  Medication Sig Dispense Refill  . acetaminophen (TYLENOL) 500 MG tablet Take 500 mg by mouth as needed.    Marland Kitchen aspirin EC 81 MG tablet Take 81 mg by mouth daily.     Marland Kitchen atorvastatin (LIPITOR) 40 MG tablet TAKE ONE TABLET BY MOUTH EVERY NIGHT AT  BEDTIME 90 tablet 1  . clopidogrel (PLAVIX) 75 MG tablet TAKE ONE TABLET BY MOUTH DAILY 90 tablet 0  . docusate sodium (COLACE) 100 MG capsule Take 100 mg by mouth daily.     . furosemide (LASIX) 20 MG tablet Take 1 tablet (20 mg total) by mouth daily. 90 tablet 3  . glimepiride (AMARYL) 1 MG tablet Take 1 tablet (1 mg total) by mouth daily before breakfast. 30 tablet 11  . metFORMIN (GLUCOPHAGE) 500 MG tablet Take 1 tablet (500 mg total) by mouth 3 (three) times daily with meals. 90 tablet 6  . metoCLOPramide (REGLAN) 10 MG tablet Take 10 mg by mouth every 6 (six) hours as needed for nausea or vomiting.     . metoprolol tartrate (LOPRESSOR) 25 MG tablet Take 12.5 mg by mouth daily.    . pantoprazole (PROTONIX) 20 MG tablet Take 1 tablet (20 mg total) by mouth daily. 90 tablet 1  . traZODone (DESYREL) 50 MG tablet Take 75 mg by mouth at bedtime as needed for sleep.    Marland Kitchen gabapentin (NEURONTIN) 100 MG capsule Take one tablet in am, one in afternoon, three in evening 150 capsule 6  . cephALEXin (KEFLEX) 500 MG capsule Take 1 capsule (500 mg total) by mouth 4 (four) times daily for 7 days. (Patient not taking: Reported on 11/26/2017) 28 capsule 0   No facility-administered medications prior to visit.  Per HPI unless specifically indicated in ROS section below Review of Systems     Objective:    BP 120/80 (BP Location: Left Arm, Patient Position: Sitting, Cuff Size: Normal)   Pulse 86   Temp 97.6 F (36.4 C) (Oral)   Ht 5\' 3"  (1.6 m)   Wt 158 lb 8 oz (71.9 kg)   SpO2 98%   BMI 28.08 kg/m   Wt Readings from Last 3 Encounters:  11/26/17 158 lb 8 oz (71.9 kg)  11/25/17 159 lb (72.1 kg)  11/13/17 161 lb 2.5 oz (73.1 kg)    Physical Exam  Constitutional: She appears well-developed and well-nourished. No distress.  HENT:  Head: Normocephalic and atraumatic.  Right Ear: External ear normal.  Left Ear: External ear normal.  Nose: Nose normal.  Mouth/Throat: Oropharynx is clear and  moist. No oropharyngeal exudate.  Eyes: Pupils are equal, round, and reactive to light. Conjunctivae and EOM are normal. No scleral icterus.  Neck: Normal range of motion. Neck supple.  Cardiovascular: Normal rate, regular rhythm, normal heart sounds and intact distal pulses.  No murmur heard. Pulmonary/Chest: Effort normal and breath sounds normal. No respiratory distress. She has no wheezes. She has no rales.  Musculoskeletal: She exhibits no edema.  See HPI for foot exam if done  Lymphadenopathy:    She has no cervical adenopathy.  Skin: Skin is warm and dry. No rash noted.  Psychiatric: She has a normal mood and affect.  Nursing note and vitals reviewed.  Results for orders placed or performed in visit on 11/26/17  POCT glycosylated hemoglobin (Hb A1C)  Result Value Ref Range   Hemoglobin A1C 7.6 (A) 4.0 - 5.6 %   HbA1c POC (<> result, manual entry)     HbA1c, POC (prediabetic range)     HbA1c, POC (controlled diabetic range)     Lab Results  Component Value Date   CREATININE 1.59 (H) 11/23/2017   BUN 19 11/23/2017   NA 136 11/23/2017   K 3.9 11/23/2017   CL 106 11/23/2017   CO2 21 (L) 11/23/2017    Lab Results  Component Value Date   VITAMINB12 414 12/26/2016       Assessment & Plan:   Problem List Items Addressed This Visit    Renal insufficiency    Will recheck at [facility]      Nocturnal leg movements    Ongoing trouble, bothersome but not affecting sleep. Will continue to monitor.       Hypertension, essential    Chronic, stable on metoprolol       Relevant Medications   EPINEPHrine 0.3 mg/0.3 mL IJ SOAJ injection   DM (diabetes mellitus), type 2, uncontrolled w/neurologic complication ([name]) - Primary    Chronic, not ideal goal. Avoid increased metformin due to renal insufficiency. Discussed timing amaryl with meals always. Consider jardiance. Encouraged she schedule eye exam.       Relevant Orders   POCT glycosylated hemoglobin (Hb A1C) (Completed)    Microalbumin / creatinine urine ratio   Coronary artery disease due to lipid rich plaque    Recent stent placed. Appreciate cards care.       Relevant Medications   EPINEPHrine 0.3 mg/0.3 mL IJ SOAJ injection    Other Visit Diagnoses    Need for influenza vaccination       Relevant Orders   Flu Vaccine QUAD 36+ mos IM (Completed)       Meds ordered this encounter  Medications  . gabapentin (NEURONTIN) 100  MG capsule    Sig: Take one tablet in am, one in afternoon, three in evening    Dispense:  150 capsule    Refill:  11  . EPINEPHrine 0.3 mg/0.3 mL IJ SOAJ injection    Sig: Inject 0.3 mLs (0.3 mg total) into the muscle once for 1 dose.    Dispense:  1 Device    Refill:  1   Orders Placed This Encounter  Procedures  . Flu Vaccine QUAD 36+ mos IM  . Microalbumin / creatinine urine ratio  . POCT glycosylated hemoglobin (Hb A1C)    Follow up plan: Return for annual exam, prior fasting for blood work, medicare wellness visit.  [name], MD

## 2017-11-26 NOTE — Assessment & Plan Note (Signed)
Chronic, stable on metoprolol.  

## 2017-11-26 NOTE — Assessment & Plan Note (Addendum)
Recent stent placed. Appreciate cards care.

## 2017-11-26 NOTE — Assessment & Plan Note (Signed)
Ongoing trouble, bothersome but not affecting sleep. Will continue to monitor.

## 2017-11-26 NOTE — Patient Instructions (Addendum)
Flu shot today Take amaryl 1mg  only when you eat - don't take on an empty stomach. Schedule eye exam as you're due.  Urine test today.  A1c was a bit elevated today.

## 2017-11-26 NOTE — Assessment & Plan Note (Signed)
Chronic, not ideal goal. Avoid increased metformin due to renal insufficiency. Discussed timing amaryl with meals always. Consider jardiance. Encouraged she schedule eye exam.

## 2017-11-26 NOTE — Assessment & Plan Note (Addendum)
Will recheck at [facility]

## 2017-11-27 LAB — MICROALBUMIN / CREATININE URINE RATIO
Creatinine,U: 86 mg/dL
Microalb Creat Ratio: 0.8 mg/g (ref 0.0–30.0)
Microalb, Ur: <0.7 mg/dL (ref 0.0–1.9)

## 2017-12-02 ENCOUNTER — Other Ambulatory Visit (INDEPENDENT_AMBULATORY_CARE_PROVIDER_SITE_OTHER): Payer: Medicare HMO

## 2017-12-02 ENCOUNTER — Encounter: Payer: Self-pay | Admitting: Pulmonary Disease

## 2017-12-02 ENCOUNTER — Ambulatory Visit (INDEPENDENT_AMBULATORY_CARE_PROVIDER_SITE_OTHER): Payer: Medicare HMO | Admitting: Pulmonary Disease

## 2017-12-02 ENCOUNTER — Telehealth (HOSPITAL_COMMUNITY): Payer: Self-pay

## 2017-12-02 VITALS — BP 124/72 | HR 73 | Ht 63.0 in | Wt 161.0 lb

## 2017-12-02 DIAGNOSIS — R0602 Shortness of breath: Secondary | ICD-10-CM | POA: Diagnosis not present

## 2017-12-02 LAB — CBC WITH DIFFERENTIAL/PLATELET
Basophils Absolute: 0.1 10*3/uL (ref 0.0–0.1)
Basophils Relative: 1 % (ref 0.0–3.0)
EOS PCT: 8.5 % — AB (ref 0.0–5.0)
Eosinophils Absolute: 0.7 10*3/uL (ref 0.0–0.7)
HCT: 38 % (ref 36.0–46.0)
Hemoglobin: 12.8 g/dL (ref 12.0–15.0)
LYMPHS ABS: 2.2 10*3/uL (ref 0.7–4.0)
Lymphocytes Relative: 27.5 % (ref 12.0–46.0)
MCHC: 33.7 g/dL (ref 30.0–36.0)
MCV: 91.2 fl (ref 78.0–100.0)
MONO ABS: 0.6 10*3/uL (ref 0.1–1.0)
Monocytes Relative: 7.4 % (ref 3.0–12.0)
NEUTROS ABS: 4.5 10*3/uL (ref 1.4–7.7)
Neutrophils Relative %: 55.6 % (ref 43.0–77.0)
Platelets: 256 10*3/uL (ref 150.0–400.0)
RBC: 4.16 Mil/uL (ref 3.87–5.11)
RDW: 13.6 % (ref 11.5–15.5)
WBC: 8.1 10*3/uL (ref 4.0–10.5)

## 2017-12-02 LAB — NITRIC OXIDE: Nitric Oxide: 14

## 2017-12-02 NOTE — Patient Instructions (Addendum)
Check some blood test today including CBC differential, IgE We will schedule you for pulmonary function test Follow-up in 1 to 2 months to review tests and determine further steps Continue to work on an exercise regimen at home and weight loss with diet and exercise
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
## 2017-12-02 NOTE — Telephone Encounter (Signed)
Called and spoke with Maria Fox in regards to patient and he stated patient is against coming to rehab but he wants her to do program. I explained to him that if she wants to do program or changes her mind to please give [place] a call. He verbalized understanding. Closed referral.

## 2017-12-03 LAB — IGE: IGE (IMMUNOGLOBULIN E), SERUM: 198 kU/L — AB (ref <=114)

## 2017-12-10 ENCOUNTER — Telehealth: Payer: Self-pay | Admitting: Pulmonary Disease

## 2017-12-10 MED ORDER — ALBUTEROL SULFATE HFA 108 (90 BASE) MCG/ACT IN AERS: 2.0000 | INHALATION_SPRAY | Freq: Four times a day (QID) | RESPIRATORY_TRACT | 1 refills | Status: DC | PRN

## 2017-12-10 NOTE — Telephone Encounter (Signed)
Maria Garfinkel, MD sent to Shon Hale, CMA        Labs tests show elevation in inflammation that may be due to allergies or asthma.  Order albuterol inhaler as needed. We will review PFTs when done and determine if additional treatment is needed.    Spoke with the pt and notified of results/recs per Dr. Vaughan Browner  She verbalized understanding  Rx sent to pharm

## 2017-12-10 NOTE — Progress Notes (Signed)
See PN dated 12/10/17

## 2017-12-11 ENCOUNTER — Other Ambulatory Visit: Payer: Medicare HMO | Admitting: *Deleted

## 2017-12-11 DIAGNOSIS — I25810 Atherosclerosis of coronary artery bypass graft(s) without angina pectoris: Secondary | ICD-10-CM

## 2017-12-11 DIAGNOSIS — I5030 Unspecified diastolic (congestive) heart failure: Secondary | ICD-10-CM

## 2017-12-11 DIAGNOSIS — I10 Essential (primary) hypertension: Secondary | ICD-10-CM | POA: Diagnosis not present

## 2017-12-11 LAB — BASIC METABOLIC PANEL
BUN/Creatinine Ratio: 12 (ref 12–28)
BUN: 15 mg/dL (ref 8–27)
CO2: 20 mmol/L (ref 20–29)
Calcium: 9.4 mg/dL (ref 8.7–10.3)
Chloride: 102 mmol/L (ref 96–106)
Creatinine, Ser: 1.22 mg/dL — ABNORMAL HIGH (ref 0.57–1.00)
GFR calc Af Amer: 50 mL/min/{1.73_m2} — ABNORMAL LOW (ref >59)
GFR calc non Af Amer: 44 mL/min/{1.73_m2} — ABNORMAL LOW (ref >59)
Glucose: 215 mg/dL — ABNORMAL HIGH (ref 65–99)
Potassium: 4.9 mmol/L (ref 3.5–5.2)
SODIUM: 140 mmol/L (ref 134–144)

## 2017-12-31 ENCOUNTER — Other Ambulatory Visit: Payer: Self-pay | Admitting: Family Medicine

## 2017-12-31 DIAGNOSIS — E785 Hyperlipidemia, unspecified: Secondary | ICD-10-CM

## 2018-01-13 ENCOUNTER — Encounter: Payer: Self-pay | Admitting: Cardiology

## 2018-01-13 ENCOUNTER — Ambulatory Visit (INDEPENDENT_AMBULATORY_CARE_PROVIDER_SITE_OTHER): Payer: Medicare HMO | Admitting: Cardiology

## 2018-01-13 ENCOUNTER — Encounter (INDEPENDENT_AMBULATORY_CARE_PROVIDER_SITE_OTHER): Payer: Self-pay

## 2018-01-13 VITALS — BP 122/78 | HR 84 | Ht 63.0 in | Wt 163.2 lb

## 2018-01-13 DIAGNOSIS — I2583 Coronary atherosclerosis due to lipid rich plaque: Secondary | ICD-10-CM

## 2018-01-13 DIAGNOSIS — I10 Essential (primary) hypertension: Secondary | ICD-10-CM | POA: Diagnosis not present

## 2018-01-13 DIAGNOSIS — E7800 Pure hypercholesterolemia, unspecified: Secondary | ICD-10-CM | POA: Diagnosis not present

## 2018-01-13 DIAGNOSIS — I4729 Other ventricular tachycardia: Secondary | ICD-10-CM

## 2018-01-13 DIAGNOSIS — I2510 Atherosclerotic heart disease of native coronary artery without angina pectoris: Secondary | ICD-10-CM | POA: Diagnosis not present

## 2018-01-13 DIAGNOSIS — I951 Orthostatic hypotension: Secondary | ICD-10-CM | POA: Diagnosis not present

## 2018-01-13 DIAGNOSIS — I472 Ventricular tachycardia: Secondary | ICD-10-CM | POA: Diagnosis not present

## 2018-01-13 NOTE — Patient Instructions (Signed)
Medication Instructions:  Your physician recommends that you continue on your current medications as directed. Please refer to the Current Medication list given to you today.  If you need a refill on your cardiac medications before your next appointment, please call your pharmacy.   Lab work:  If you have labs (blood work) drawn today and your tests are completely normal, you will receive your results only by: Marland Kitchen MyChart Message (if you have MyChart) OR . A paper copy in the mail If you have any lab test that is abnormal or we need to change your treatment, we will call you to review the results.  Follow-Up: At [facility], you and your health needs are our priority.  As part of our continuing mission to provide you with exceptional heart care, we have created designated Provider Care Teams.  These Care Teams include your primary Cardiologist (physician) and Advanced Practice Providers (APPs -  Physician Assistants and Nurse Practitioners) who all work together to provide you with the care you need, when you need it.  Your physician wants you to follow-up in: 6 months with PA.  Please call our office to schedule the follow-up appointment.   You will need a follow up appointment in 1 years.  Please call our office 2 months in advance to schedule this appointment.  You may see Fransico Him, MD or one of the following Advanced Practice Providers on your designated Care Team:   Rossie, PA-C Melina Copa, PA-C . Ermalinda Barrios, PA-C
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
## 2018-01-21 ENCOUNTER — Other Ambulatory Visit: Payer: Self-pay | Admitting: Family Medicine

## 2018-01-21 DIAGNOSIS — E1149 Type 2 diabetes mellitus with other diabetic neurological complication: Secondary | ICD-10-CM

## 2018-01-21 DIAGNOSIS — IMO0002 Reserved for concepts with insufficient information to code with codable children: Secondary | ICD-10-CM

## 2018-01-21 DIAGNOSIS — E611 Iron deficiency: Secondary | ICD-10-CM

## 2018-01-21 DIAGNOSIS — E538 Deficiency of other specified B group vitamins: Secondary | ICD-10-CM

## 2018-01-21 DIAGNOSIS — E7800 Pure hypercholesterolemia, unspecified: Secondary | ICD-10-CM

## 2018-01-21 DIAGNOSIS — N289 Disorder of kidney and ureter, unspecified: Secondary | ICD-10-CM

## 2018-01-21 DIAGNOSIS — E1165 Type 2 diabetes mellitus with hyperglycemia: Secondary | ICD-10-CM

## 2018-01-22 ENCOUNTER — Ambulatory Visit: Payer: Self-pay

## 2018-01-23 ENCOUNTER — Ambulatory Visit: Payer: Medicare HMO | Admitting: Podiatry

## 2018-01-27 ENCOUNTER — Encounter: Payer: Self-pay | Admitting: Family Medicine

## 2018-02-02 ENCOUNTER — Ambulatory Visit: Payer: Self-pay | Admitting: Cardiology

## 2018-02-10 ENCOUNTER — Ambulatory Visit: Payer: Self-pay | Admitting: Pulmonary Disease

## 2018-02-19 ENCOUNTER — Ambulatory Visit: Payer: Medicare HMO | Admitting: Podiatry

## 2018-03-17 DIAGNOSIS — H35372 Puckering of macula, left eye: Secondary | ICD-10-CM | POA: Diagnosis not present

## 2018-03-17 DIAGNOSIS — Z961 Presence of intraocular lens: Secondary | ICD-10-CM | POA: Diagnosis not present

## 2018-03-17 DIAGNOSIS — H40013 Open angle with borderline findings, low risk, bilateral: Secondary | ICD-10-CM | POA: Diagnosis not present

## 2018-03-17 DIAGNOSIS — H26493 Other secondary cataract, bilateral: Secondary | ICD-10-CM | POA: Diagnosis not present

## 2018-03-17 DIAGNOSIS — E113493 Type 2 diabetes mellitus with severe nonproliferative diabetic retinopathy without macular edema, bilateral: Secondary | ICD-10-CM | POA: Diagnosis not present

## 2018-03-17 LAB — HM DIABETES EYE EXAM

## 2018-03-20 DIAGNOSIS — E113491 Type 2 diabetes mellitus with severe nonproliferative diabetic retinopathy without macular edema, right eye: Secondary | ICD-10-CM | POA: Diagnosis not present

## 2018-03-20 DIAGNOSIS — E113412 Type 2 diabetes mellitus with severe nonproliferative diabetic retinopathy with macular edema, left eye: Secondary | ICD-10-CM | POA: Diagnosis not present

## 2018-03-20 DIAGNOSIS — H35372 Puckering of macula, left eye: Secondary | ICD-10-CM | POA: Diagnosis not present

## 2018-03-20 DIAGNOSIS — H3561 Retinal hemorrhage, right eye: Secondary | ICD-10-CM | POA: Diagnosis not present

## 2018-03-25 DIAGNOSIS — E113491 Type 2 diabetes mellitus with severe nonproliferative diabetic retinopathy without macular edema, right eye: Secondary | ICD-10-CM | POA: Diagnosis not present

## 2018-03-27 ENCOUNTER — Other Ambulatory Visit: Payer: Self-pay | Admitting: Family Medicine

## 2018-03-27 NOTE — Telephone Encounter (Signed)
E-scribed refill.  Please reschedule wellness visit.

## 2018-03-30 NOTE — Telephone Encounter (Signed)
Left message asking pt to call office  °

## 2018-04-01 DIAGNOSIS — E113412 Type 2 diabetes mellitus with severe nonproliferative diabetic retinopathy with macular edema, left eye: Secondary | ICD-10-CM | POA: Diagnosis not present

## 2018-04-01 LAB — HM DIABETES EYE EXAM

## 2018-04-03 ENCOUNTER — Telehealth: Payer: Self-pay

## 2018-04-03 NOTE — Telephone Encounter (Signed)
Pt called [facility] pharmacy advised pt to call our office to have our office contact [employer] ins about the dosage prescribed for Gabapentin 100 mg. Pt request cb next wk.

## 2018-04-03 NOTE — Telephone Encounter (Signed)
Submitted PA for Gabapentin 100 mg cap, key:  [id number], Rx#:  [id number].  Decision pending.

## 2018-04-05 ENCOUNTER — Other Ambulatory Visit: Payer: Self-pay | Admitting: Family Medicine

## 2018-04-05 DIAGNOSIS — E785 Hyperlipidemia, unspecified: Secondary | ICD-10-CM

## 2018-04-06 NOTE — Telephone Encounter (Signed)
E-scribed refill.  Please r/s wellness visit.

## 2018-04-06 NOTE — Telephone Encounter (Signed)
Left message asking pt to call office  If pt needs appointments for medicare wellness/and cpx with dr g same day  Start with 3/20

## 2018-04-06 NOTE — Telephone Encounter (Addendum)
Received PA approval effective 03/11/18- 03/11/2019.   Spoke with [facility] [address] informing them of PA approval.   Left message on vm per dpr informing pt we received PA approval today and [facility] [address] has been informed.

## 2018-04-14 ENCOUNTER — Telehealth: Payer: Self-pay | Admitting: Family Medicine

## 2018-04-14 NOTE — Telephone Encounter (Signed)
Left message asking pt to call office  °

## 2018-04-14 NOTE — Telephone Encounter (Signed)
Pt returned call to [place]. Offered to go ahead and get pt scheduled for her awv, but pt declined. Pt states she has no transportation right now and 2will call back to r/s.

## 2018-04-14 NOTE — Telephone Encounter (Signed)
E-scribed refill.  Please r/s annual wellness visit.

## 2018-04-14 NOTE — Telephone Encounter (Signed)
Noted  

## 2018-05-10 LAB — HM DIABETES EYE EXAM

## 2018-05-15 ENCOUNTER — Telehealth: Payer: Self-pay | Admitting: Family Medicine

## 2018-05-15 NOTE — Telephone Encounter (Signed)
Medicare wellness 5/20 cpx dr [name] 5/22  Pt aware

## 2018-05-15 NOTE — Telephone Encounter (Signed)
Noted  

## 2018-05-15 NOTE — Telephone Encounter (Signed)
E-scribed refill.  Pls r/s annual wellness visit.

## 2018-06-16 ENCOUNTER — Other Ambulatory Visit: Payer: Self-pay | Admitting: Family Medicine

## 2018-06-16 DIAGNOSIS — E785 Hyperlipidemia, unspecified: Secondary | ICD-10-CM

## 2018-07-14 ENCOUNTER — Other Ambulatory Visit: Payer: Self-pay | Admitting: Family Medicine

## 2018-07-14 DIAGNOSIS — E1149 Type 2 diabetes mellitus with other diabetic neurological complication: Secondary | ICD-10-CM

## 2018-07-14 DIAGNOSIS — E7800 Pure hypercholesterolemia, unspecified: Secondary | ICD-10-CM

## 2018-07-14 DIAGNOSIS — IMO0002 Reserved for concepts with insufficient information to code with codable children: Secondary | ICD-10-CM

## 2018-07-14 DIAGNOSIS — E1165 Type 2 diabetes mellitus with hyperglycemia: Secondary | ICD-10-CM

## 2018-07-14 NOTE — Telephone Encounter (Signed)
Noted  

## 2018-07-14 NOTE — Telephone Encounter (Addendum)
E-scribed metoprolol refill.   [place], pls schedule follow up virtual visit for health management.  Dr. [name], do you want to add a HGB A1c to the future labs?

## 2018-07-14 NOTE — Telephone Encounter (Signed)
Patient is returning call.  °

## 2018-07-14 NOTE — Telephone Encounter (Signed)
Lvm for pt to call us back and schedule appt. I saw that pt had called in yesterday and rescheduled her physical until Sept. Patient might not be willing to do virtual appointment but hopefully she will call back and let us know if not I will try to reach out when possible.

## 2018-07-15 NOTE — Telephone Encounter (Signed)
Pt is scheduled for labs and phone visit 07/16/18

## 2018-07-15 NOTE — Telephone Encounter (Signed)
I placed new orders

## 2018-07-15 NOTE — Telephone Encounter (Signed)
Called pt back again and left voicemail for her to call back and schedule virtual visit with Dr. [name]. For health management with labs prior.

## 2018-07-16 ENCOUNTER — Other Ambulatory Visit: Payer: Self-pay

## 2018-07-16 ENCOUNTER — Other Ambulatory Visit (INDEPENDENT_AMBULATORY_CARE_PROVIDER_SITE_OTHER): Payer: Medicare HMO

## 2018-07-16 ENCOUNTER — Ambulatory Visit (INDEPENDENT_AMBULATORY_CARE_PROVIDER_SITE_OTHER): Payer: Medicare HMO | Admitting: Family Medicine

## 2018-07-16 ENCOUNTER — Encounter: Payer: Self-pay | Admitting: Family Medicine

## 2018-07-16 VITALS — Ht 63.0 in

## 2018-07-16 DIAGNOSIS — I951 Orthostatic hypotension: Secondary | ICD-10-CM

## 2018-07-16 DIAGNOSIS — IMO0002 Reserved for concepts with insufficient information to code with codable children: Secondary | ICD-10-CM

## 2018-07-16 DIAGNOSIS — E1149 Type 2 diabetes mellitus with other diabetic neurological complication: Secondary | ICD-10-CM

## 2018-07-16 DIAGNOSIS — E538 Deficiency of other specified B group vitamins: Secondary | ICD-10-CM

## 2018-07-16 DIAGNOSIS — Z8673 Personal history of transient ischemic attack (TIA), and cerebral infarction without residual deficits: Secondary | ICD-10-CM | POA: Diagnosis not present

## 2018-07-16 DIAGNOSIS — E7800 Pure hypercholesterolemia, unspecified: Secondary | ICD-10-CM | POA: Diagnosis not present

## 2018-07-16 DIAGNOSIS — N289 Disorder of kidney and ureter, unspecified: Secondary | ICD-10-CM

## 2018-07-16 DIAGNOSIS — E113413 Type 2 diabetes mellitus with severe nonproliferative diabetic retinopathy with macular edema, bilateral: Secondary | ICD-10-CM | POA: Insufficient documentation

## 2018-07-16 DIAGNOSIS — E11319 Type 2 diabetes mellitus with unspecified diabetic retinopathy without macular edema: Secondary | ICD-10-CM | POA: Diagnosis not present

## 2018-07-16 DIAGNOSIS — E1165 Type 2 diabetes mellitus with hyperglycemia: Secondary | ICD-10-CM

## 2018-07-16 DIAGNOSIS — E785 Hyperlipidemia, unspecified: Secondary | ICD-10-CM | POA: Diagnosis not present

## 2018-07-16 DIAGNOSIS — E113293 Type 2 diabetes mellitus with mild nonproliferative diabetic retinopathy without macular edema, bilateral: Secondary | ICD-10-CM | POA: Insufficient documentation

## 2018-07-16 DIAGNOSIS — Z951 Presence of aortocoronary bypass graft: Secondary | ICD-10-CM | POA: Diagnosis not present

## 2018-07-16 LAB — COMPREHENSIVE METABOLIC PANEL
ALT: 16 U/L (ref 0–35)
AST: 18 U/L (ref 0–37)
Albumin: 4 g/dL (ref 3.5–5.2)
Alkaline Phosphatase: 87 U/L (ref 39–117)
BUN: 24 mg/dL — ABNORMAL HIGH (ref 6–23)
CO2: 26 mEq/L (ref 19–32)
Calcium: 9.5 mg/dL (ref 8.4–10.5)
Chloride: 104 mEq/L (ref 96–112)
Creatinine, Ser: 1.36 mg/dL — ABNORMAL HIGH (ref 0.40–1.20)
GFR: 37.87 mL/min — ABNORMAL LOW (ref >60.00)
Glucose, Bld: 256 mg/dL — ABNORMAL HIGH (ref 70–99)
Potassium: 4.8 mEq/L (ref 3.5–5.1)
Sodium: 140 mEq/L (ref 135–145)
Total Bilirubin: 0.5 mg/dL (ref 0.2–1.2)
Total Protein: 6.8 g/dL (ref 6.0–8.3)

## 2018-07-16 LAB — LIPID PANEL
Cholesterol: 100 mg/dL (ref 0–200)
HDL: 29.5 mg/dL — ABNORMAL LOW (ref >39.00)
LDL Cholesterol: 38 mg/dL (ref 0–99)
NonHDL: 70.41
Total CHOL/HDL Ratio: 3
Triglycerides: 162 mg/dL — ABNORMAL HIGH (ref 0.0–149.0)
VLDL: 32.4 mg/dL (ref 0.0–40.0)

## 2018-07-16 LAB — MICROALBUMIN / CREATININE URINE RATIO
Creatinine,U: 33.1 mg/dL
Microalb Creat Ratio: 2.1 mg/g (ref 0.0–30.0)
Microalb, Ur: <0.7 mg/dL (ref 0.0–1.9)

## 2018-07-16 LAB — VITAMIN B12: Vitamin B-12: 161 pg/mL — ABNORMAL LOW (ref 211–911)

## 2018-07-16 LAB — HEMOGLOBIN A1C: Hgb A1c MFr Bld: 8.8 % — ABNORMAL HIGH (ref 4.6–6.5)

## 2018-07-16 MED ORDER — PANTOPRAZOLE SODIUM 20 MG PO TBEC: 20.0000 mg | DELAYED_RELEASE_TABLET | Freq: Every day | ORAL | 1 refills | Status: DC

## 2018-07-16 MED ORDER — GLIMEPIRIDE 1 MG PO TABS: 1.0000 mg | ORAL_TABLET | Freq: Every day | ORAL | 1 refills | Status: DC

## 2018-07-16 MED ORDER — TRAZODONE HCL 50 MG PO TABS: 75.0000 mg | ORAL_TABLET | Freq: Every evening | ORAL | 1 refills | Status: DC | PRN

## 2018-07-16 MED ORDER — METOPROLOL TARTRATE 25 MG PO TABS: ORAL_TABLET | ORAL | 1 refills | Status: DC

## 2018-07-16 MED ORDER — FUROSEMIDE 20 MG PO TABS: 20.0000 mg | ORAL_TABLET | Freq: Every day | ORAL | 1 refills | Status: DC

## 2018-07-16 MED ORDER — CLOPIDOGREL BISULFATE 75 MG PO TABS: 75.0000 mg | ORAL_TABLET | Freq: Every day | ORAL | 1 refills | Status: DC

## 2018-07-16 MED ORDER — METFORMIN HCL 500 MG PO TABS: 500.0000 mg | ORAL_TABLET | Freq: Three times a day (TID) | ORAL | 1 refills | Status: DC

## 2018-07-16 MED ORDER — ATORVASTATIN CALCIUM 40 MG PO TABS: 40.0000 mg | ORAL_TABLET | Freq: Every day | ORAL | 1 refills | Status: DC

## 2018-07-16 MED ORDER — GABAPENTIN 100 MG PO CAPS: ORAL_CAPSULE | ORAL | 1 refills | Status: DC

## 2018-07-16 MED ORDER — DOCUSATE SODIUM 100 MG PO CAPS: 100.0000 mg | ORAL_CAPSULE | Freq: Every day | ORAL | 6 refills | Status: DC

## 2018-07-16 NOTE — Assessment & Plan Note (Signed)
Sounds like she has retinopathy and is followed by retina specialist Zigmund Daniel) as well as Dr Katy Fitch. She will ask ophtho office to send [place] reports to update chart.

## 2018-07-16 NOTE — Assessment & Plan Note (Signed)
Chronic, A1c pending. Continue current regimen. Discussed possibly increasing glimepiride. Consider GLP1 or SGLT2 for cardiovascular benefit.

## 2018-07-16 NOTE — Assessment & Plan Note (Signed)
b12 level pending. Seems to be off b12 replacement.

## 2018-07-16 NOTE — Addendum Note (Signed)
Addended by: Cloyd Stagers on: 07/16/2018 11:19 AM   Modules accepted: Orders

## 2018-07-16 NOTE — Assessment & Plan Note (Signed)
Statin refilled. Pending FLP.

## 2018-07-16 NOTE — Progress Notes (Signed)
Virtual visit completed through [employer]. Due to national recommendations of social distancing due to [place] 19, a virtual visit is felt to be most appropriate for this patient at this time.   Patient location: in car at [facility] Provider location: [place] at [facility], office If any vitals were documented, they were collected by patient at home unless specified below.    Ht 5\' 3"  (1.6 m)   BMI 28.91 kg/m    CC: DM f/u visit Subjective:    Patient ID: Maria Fox, female    DOB: 01/15/1946, 73 y.o.   MRN: [id number]  HPI: Maria Fox is a 73 y.o. female presenting on 07/16/2018 for Follow-up   I last saw patient 11/2017.   DM - does regularly check sugars once daily fasting - good readings recently (150-160s). Compliant with antihyperglycemic regimen which includes: metformin 500mg  tid, amaryl 1mg  daily. Denies low sugars or hypoglycemic symptoms. Denies paresthesias. Last diabetic eye exam: 05/2018 ([name]) had eye treatments. Pneumovax: 2012. Prevnar: 2018. Glucometer brand: ?accuchek. DSME: has not completed.  Lab Results  Component Value Date   HGBA1C 7.6 (A) 11/26/2017   Diabetic Foot Exam - Simple   No data filed     Lab Results  Component Value Date   MICROALBUR <0.7 11/26/2017     Saw cardiology 01/2018, note reviewed (Dr Radford Pax). Recent hospitalization last year with stent placement.  Saw pulm for dyspnea of unclear etiology 11/2017 (Dr Vaughan Browner)      Relevant past medical, surgical, family and social history reviewed and updated as indicated. Interim medical history since our last visit reviewed. Allergies and medications reviewed and updated. Outpatient Medications Prior to Visit  Medication Sig Dispense Refill  . acetaminophen (TYLENOL) 500 MG tablet Take 500 mg by mouth as needed.    Marland Kitchen albuterol (PROAIR HFA) 108 (90 Base) MCG/ACT inhaler Inhale 2 puffs into the lungs every 6 (six) hours as needed for wheezing or shortness of breath. 1 Inhaler 1  .  aspirin EC 81 MG tablet Take 81 mg by mouth daily.     . metoCLOPramide (REGLAN) 10 MG tablet Take 10 mg by mouth every 6 (six) hours as needed for nausea or vomiting.     Marland Kitchen atorvastatin (LIPITOR) 40 MG tablet TAKE ONE TABLET BY MOUTH EVERY NIGHT AT BEDTIME 90 tablet 0  . clopidogrel (PLAVIX) 75 MG tablet TAKE ONE TABLET BY MOUTH DAILY 90 tablet 0  . docusate sodium (COLACE) 100 MG capsule Take 100 mg by mouth daily.     . furosemide (LASIX) 20 MG tablet Take 1 tablet (20 mg total) by mouth daily. 90 tablet 3  . gabapentin (NEURONTIN) 100 MG capsule Take one tablet in am, one in afternoon, three in evening 150 capsule 11  . glimepiride (AMARYL) 1 MG tablet Take 1 tablet (1 mg total) by mouth daily before breakfast. 30 tablet 11  . metFORMIN (GLUCOPHAGE) 500 MG tablet TAKE ONE TABLET BY MOUTH THREE TIMES A DAY WITH MEALS. NEED ANNUAL WELLNESS APPOINTMENT FOR ADDITIONAL REFILLS 270 tablet 0  . metoprolol tartrate (LOPRESSOR) 25 MG tablet TAKE ONE-HALF TABLET BY MOUTH DAILY 45 tablet 0  . pantoprazole (PROTONIX) 20 MG tablet TAKE ONE TABLET BY MOUTH DAILY 90 tablet 0  . traZODone (DESYREL) 50 MG tablet Take 75 mg by mouth at bedtime as needed for sleep.     No facility-administered medications prior to visit.      Per HPI unless specifically indicated in ROS section below Review  of Systems Objective:    Ht 5\' 3"  (1.6 m)   BMI 28.91 kg/m   Wt Readings from Last 3 Encounters:  01/13/18 163 lb 3.2 oz (74 kg)  12/02/17 161 lb (73 kg)  11/26/17 158 lb 8 oz (71.9 kg)     Physical exam: Gen: alert, NAD, not ill appearing Pulm: speaks in complete sentences without increased work of breathing Psych: normal mood, normal thought content      Results for orders placed or performed in visit on 07/16/18  HM DIABETES EYE EXAM  Result Value Ref Range   HM Diabetic Eye Exam Retinopathy (A) No Retinopathy   Assessment & Plan:   Problem List Items Addressed This Visit    Vitamin B12 deficiency     b12 level pending. Seems to be off b12 replacement.       S/P CABG x 3   HLD (hyperlipidemia)    Statin refilled. Pending FLP.       Relevant Medications   clopidogrel (PLAVIX) 75 MG tablet   atorvastatin (LIPITOR) 40 MG tablet   furosemide (LASIX) 20 MG tablet   metoprolol tartrate (LOPRESSOR) 25 MG tablet   History of cerebrovascular accident (CVA) in adulthood   DM (diabetes mellitus), type 2, uncontrolled w/neurologic complication ([name]) - Primary    Chronic, A1c pending. Continue current regimen. Discussed possibly increasing glimepiride. Consider GLP1 or SGLT2 for cardiovascular benefit.       Relevant Medications   atorvastatin (LIPITOR) 40 MG tablet   glimepiride (AMARYL) 1 MG tablet   metFORMIN (GLUCOPHAGE) 500 MG tablet   Diabetic retinopathy associated with type 2 diabetes mellitus ([place])    Sounds like she has retinopathy and is followed by retina specialist Zigmund Daniel) as well as Dr Katy Fitch. She will ask ophtho office to send [place] reports to update chart.       Relevant Medications   atorvastatin (LIPITOR) 40 MG tablet   glimepiride (AMARYL) 1 MG tablet   metFORMIN (GLUCOPHAGE) 500 MG tablet   Autonomic orthostatic hypotension   Relevant Medications   atorvastatin (LIPITOR) 40 MG tablet   furosemide (LASIX) 20 MG tablet   metoprolol tartrate (LOPRESSOR) 25 MG tablet       Meds ordered this encounter  Medications  . clopidogrel (PLAVIX) 75 MG tablet    Sig: Take 1 tablet (75 mg total) by mouth daily.    Dispense:  90 tablet    Refill:  1  . atorvastatin (LIPITOR) 40 MG tablet    Sig: Take 1 tablet (40 mg total) by mouth at bedtime.    Dispense:  90 tablet    Refill:  1  . docusate sodium (COLACE) 100 MG capsule    Sig: Take 1 capsule (100 mg total) by mouth daily.    Dispense:  30 capsule    Refill:  6  . furosemide (LASIX) 20 MG tablet    Sig: Take 1 tablet (20 mg total) by mouth daily.    Dispense:  90 tablet    Refill:  1  . gabapentin (NEURONTIN)  100 MG capsule    Sig: Take one tablet in am, one in afternoon, three in evening    Dispense:  450 capsule    Refill:  1  . glimepiride (AMARYL) 1 MG tablet    Sig: Take 1 tablet (1 mg total) by mouth daily before breakfast.    Dispense:  90 tablet    Refill:  1  . metFORMIN (GLUCOPHAGE) 500 MG tablet  Sig: Take 1 tablet (500 mg total) by mouth 3 (three) times daily before meals.    Dispense:  270 tablet    Refill:  1  . metoprolol tartrate (LOPRESSOR) 25 MG tablet    Sig: TAKE ONE-HALF TABLET BY MOUTH DAILY    Dispense:  45 tablet    Refill:  1  . pantoprazole (PROTONIX) 20 MG tablet    Sig: Take 1 tablet (20 mg total) by mouth daily.    Dispense:  90 tablet    Refill:  1  . traZODone (DESYREL) 50 MG tablet    Sig: Take 1.5 tablets (75 mg total) by mouth at bedtime as needed for sleep.    Dispense:  135 tablet    Refill:  1   Orders Placed This Encounter  Procedures  . HM DIABETES EYE EXAM    This external order was created through the Results Console.    Follow up plan: No follow-ups on file.  [name], MD

## 2018-07-21 ENCOUNTER — Other Ambulatory Visit: Payer: Self-pay | Admitting: Family Medicine

## 2018-07-21 ENCOUNTER — Encounter: Payer: Self-pay | Admitting: Family Medicine

## 2018-07-21 ENCOUNTER — Telehealth: Payer: Self-pay | Admitting: Family Medicine

## 2018-07-21 MED ORDER — B-12 1000 MCG SL SUBL: 1.0000 | SUBLINGUAL_TABLET | Freq: Every day | SUBLINGUAL | Status: DC

## 2018-07-21 MED ORDER — GLIMEPIRIDE 2 MG PO TABS: 2.0000 mg | ORAL_TABLET | Freq: Every day | ORAL | 1 refills | Status: DC

## 2018-07-21 NOTE — Telephone Encounter (Signed)
Best number [phone] Or [phone]  Pt called checking on her lab results

## 2018-07-22 MED ORDER — METOCLOPRAMIDE HCL 10 MG PO TABS: 10.0000 mg | ORAL_TABLET | Freq: Four times a day (QID) | ORAL | 3 refills | Status: DC | PRN

## 2018-07-22 NOTE — Telephone Encounter (Signed)
Spoke with pt asking if she got vm with lab results. Confirms she did and understands they are also being mailed.   Also, pt requested refill for Reglan. E-scribed refill.

## 2018-07-29 ENCOUNTER — Ambulatory Visit: Payer: Self-pay

## 2018-07-31 ENCOUNTER — Encounter: Payer: Self-pay | Admitting: Family Medicine

## 2018-09-01 ENCOUNTER — Ambulatory Visit (INDEPENDENT_AMBULATORY_CARE_PROVIDER_SITE_OTHER): Payer: Medicare HMO | Admitting: Podiatry

## 2018-09-01 ENCOUNTER — Encounter: Payer: Self-pay | Admitting: Podiatry

## 2018-09-01 ENCOUNTER — Other Ambulatory Visit: Payer: Self-pay

## 2018-09-01 VITALS — Temp 97.2°F

## 2018-09-01 DIAGNOSIS — B351 Tinea unguium: Secondary | ICD-10-CM | POA: Diagnosis not present

## 2018-09-01 DIAGNOSIS — S90425A Blister (nonthermal), left lesser toe(s), initial encounter: Secondary | ICD-10-CM | POA: Diagnosis not present

## 2018-09-01 DIAGNOSIS — E0865 Diabetes mellitus due to underlying condition with hyperglycemia: Secondary | ICD-10-CM

## 2018-09-01 NOTE — Patient Instructions (Addendum)
Clean the right 2nd toe with warm soapy water. Dry the toe with gauze. Apply Neosporin to the toe once daily until healed. Call the office immediately if you experience redness, red streaks, drainage, warmth, swelling, or pain in the toe or foot.     Onychomycosis/Fungal Toenails  WHAT IS IT? An infection that lies within the keratin of your nail plate that is caused by a fungus.  WHY ME? Fungal infections affect all ages, sexes, races, and creeds.  There may be many factors that predispose you to a fungal infection such as age, coexisting medical conditions such as diabetes, or an autoimmune disease; stress, medications, fatigue, genetics, etc.  Bottom line: fungus thrives in a warm, moist environment and your shoes offer such a location.  IS IT CONTAGIOUS? Theoretically, yes.  You do not want to share shoes, nail clippers or files with someone who has fungal toenails.  Walking around barefoot in the same room or sleeping in the same bed is unlikely to transfer the organism.  It is important to realize, however, that fungus can spread easily from one nail to the next on the same foot.  HOW DO WE TREAT THIS?  There are several ways to treat this condition.  Treatment may depend on many factors such as age, medications, pregnancy, liver and kidney conditions, etc.  It is best to ask your doctor which options are available to you.  1. No treatment.   Unlike many other medical concerns, you can live with this condition.  However for many people this can be a painful condition and may lead to ingrown toenails or a bacterial infection.  It is recommended that you keep the nails cut short to help reduce the amount of fungal nail. 2. Topical treatment.  These range from herbal remedies to prescription strength nail lacquers.  About 40-50% effective, topicals require twice daily application for approximately 9 to 12 months or until an entirely new nail has grown out.  The most effective topicals are  medical grade medications available through physicians offices. 3. Oral antifungal medications.  With an 80-90% cure rate, the most common oral medication requires 3 to 4 months of therapy and stays in your system for a year as the new nail grows out.  Oral antifungal medications do require blood work to make sure it is a safe drug for you.  A liver function panel will be performed prior to starting the medication and after the first month of treatment.  It is important to have the blood work performed to avoid any harmful side effects.  In general, this medication safe but blood work is required. 4. Laser Therapy.  This treatment is performed by applying a specialized laser to the affected nail plate.  This therapy is noninvasive, fast, and non-painful.  It is not covered by insurance and is therefore, out of pocket.  The results have been very good with a 80-95% cure rate.  [facility] is the only practice in the area to offer this therapy. 5. Permanent Nail Avulsion.  Removing the entire nail so that a new nail will not grow back.

## 2018-09-03 ENCOUNTER — Telehealth: Payer: Self-pay | Admitting: Podiatry

## 2018-09-03 NOTE — Telephone Encounter (Signed)
I wanted to let Dr. Elisha Ponder know that I wanted that solution, that she had mentioned.

## 2018-09-04 ENCOUNTER — Other Ambulatory Visit: Payer: Self-pay

## 2018-09-07 ENCOUNTER — Telehealth: Payer: Self-pay | Admitting: Family Medicine

## 2018-09-07 NOTE — Telephone Encounter (Signed)
Best number [phone] Pt called needing her rx transferred from [place] to [facility] [phone]  Trazodone Metformin Metoprolol Clopidogrel Glimepiride Metoclopramide Atorvastatin Pantoprazole Furosemide Gabapentin   90 day supply She stated she wants snap on lids

## 2018-09-07 NOTE — Telephone Encounter (Signed)
Called to speak with pt about transferring rxs.  Says she has called the pharmacy already but wants [place] removed from her chart and add [facility]. *Updated pt's pharmacy list.*  Notified her the change was made in chart.  Pt expresses her thanks.

## 2018-09-10 NOTE — Progress Notes (Signed)
Subjective:  Maria Fox presents to clinic today for preventative diabetic foot care with cc of  painful, thick, discolored, elongated toenails 1-5 b/l that become tender and cannot cut because of thickness. Pain is aggravated when wearing enclosed shoe gear.  She remains on Plavix.  Maria Bush, MD is her PCP.   Current Outpatient Medications:  .  acetaminophen (TYLENOL) 500 MG tablet, Take 500 mg by mouth as needed., Disp: , Rfl:  .  albuterol (PROAIR HFA) 108 (90 Base) MCG/ACT inhaler, Inhale 2 puffs into the lungs every 6 (six) hours as needed for wheezing or shortness of breath., Disp: 1 Inhaler, Rfl: 1 .  aspirin EC 81 MG tablet, Take 81 mg by mouth daily. , Disp: , Rfl:  .  atorvastatin (LIPITOR) 40 MG tablet, Take 1 tablet (40 mg total) by mouth at bedtime., Disp: 90 tablet, Rfl: 1 .  clopidogrel (PLAVIX) 75 MG tablet, Take 1 tablet (75 mg total) by mouth daily., Disp: 90 tablet, Rfl: 1 .  Cyanocobalamin (B-12) 1000 MCG SUBL, Place 1 tablet under the tongue daily., Disp: 30 each, Rfl:  .  docusate sodium (COLACE) 100 MG capsule, Take 1 capsule (100 mg total) by mouth daily., Disp: 30 capsule, Rfl: 6 .  furosemide (LASIX) 20 MG tablet, Take 1 tablet (20 mg total) by mouth daily., Disp: 90 tablet, Rfl: 1 .  gabapentin (NEURONTIN) 100 MG capsule, Take one tablet in am, one in afternoon, three in evening, Disp: 450 capsule, Rfl: 1 .  glimepiride (AMARYL) 2 MG tablet, Take 1 tablet (2 mg total) by mouth daily before breakfast., Disp: 90 tablet, Rfl: 1 .  latanoprost (XALATAN) 0.005 % ophthalmic solution, , Disp: , Rfl:  .  metFORMIN (GLUCOPHAGE) 500 MG tablet, Take 1 tablet (500 mg total) by mouth 3 (three) times daily before meals., Disp: 270 tablet, Rfl: 1 .  metoCLOPramide (REGLAN) 10 MG tablet, Take 1 tablet (10 mg total) by mouth every 6 (six) hours as needed for nausea or vomiting., Disp: 120 tablet, Rfl: 3 .  metoprolol tartrate (LOPRESSOR) 25 MG tablet, TAKE ONE-HALF TABLET  BY MOUTH DAILY, Disp: 45 tablet, Rfl: 1 .  NON FORMULARY, Antifungal cream from [place], Disp: , Rfl:  .  pantoprazole (PROTONIX) 20 MG tablet, Take 1 tablet (20 mg total) by mouth daily., Disp: 90 tablet, Rfl: 1 .  traZODone (DESYREL) 50 MG tablet, Take 1.5 tablets (75 mg total) by mouth at bedtime as needed for sleep., Disp: 135 tablet, Rfl: 1   Allergies  Allergen Reactions  . Mushroom Extract Complex Anaphylaxis  . Codeine Nausea And Vomiting  . Penicillins Hives and Swelling    Has patient had a PCN reaction causing immediate rash, facial/tongue/throat swelling, SOB or lightheadedness with hypotension: Yes Has patient had a PCN reaction causing severe rash involving mucus membranes or skin necrosis: Yes Has patient had a PCN reaction that required hospitalization Yes Has patient had a PCN reaction occurring within the last 10 years: No If all of the above answers are "NO", then may proceed with Cephalosporin use.   . Sulfa Antibiotics Nausea And Vomiting  . Bee Venom     Throat swelling  . Erythromycin Base Rash     Objective: Vitals:   09/01/18 1634  Temp: (!) 97.2 F (36.2 C)    Physical Examination:  Vascular Examination: Capillary refill time less than 3 seconds x 10 digits.  Palpable DP/PT pulses b/l.  Digital hair sparse b/l.  No edema noted b/l.  Skin temperature gradient WNL b/l.  Dermatological Examination: Skin with normal turgor, texture and tone b/l.  No open wounds b/l.  No interdigital macerations noted b/l.  Elongated, thick, discolored brittle toenails with subungual debris and pain on dorsal palpation of nailbeds 1-5 b/l.  Small blister noted interdigitally between right 2nd and 3rd digits. No erythema, no edema. Clear fluid noted under blister roof.   Musculoskeletal Examination: Muscle strength 5/5 to all muscle groups b/l  No pain, crepitus or joint discomfort with active/passive ROM.  Neurological Examination: Sensation  intact 5/5 b/l with 10 gram monofilament.  Vibratory sensation intact b/l.  Proprioceptive sensation intact b/l.  Hemoglobin A1C Latest Ref Rng & Units 07/16/2018 11/26/2017  HGBA1C 4.6 - 6.5 % 8.8(H) 7.6(A)  Some recent data might be hidden    Assessment: Mycotic nail infection with pain 1-5 b/l Blister right 3rd webspace, noninfected NIDDM, uncontrolled  Plan: 1. Toenails 1-5 b/l were debrided in length and girth without iatrogenic laceration.  2. Blister prepped with betadine and blister lanced/evacuated right 2nd digit. Light dressing applied. Maria Fox instructed to apply Neosporin Cream to toe once daily until healed. She was instructed to call the office immediately if she experiences redness, red streaks, drainage, warmth, swelling or pain in the toe/foot. 3. Continue soft, supportive shoe gear daily. 4. Report any pedal injuries to medical professional. 5. Follow up 3 months. 6. Patient/POA to call should there be a question/concern in there interim.

## 2018-09-21 ENCOUNTER — Ambulatory Visit: Payer: Medicare HMO | Admitting: Podiatry

## 2018-10-03 ENCOUNTER — Other Ambulatory Visit: Payer: Self-pay | Admitting: Family Medicine

## 2018-10-13 ENCOUNTER — Telehealth: Payer: Self-pay | Admitting: Family Medicine

## 2018-10-13 DIAGNOSIS — E785 Hyperlipidemia, unspecified: Secondary | ICD-10-CM

## 2018-10-13 MED ORDER — METOPROLOL TARTRATE 25 MG PO TABS: ORAL_TABLET | ORAL | 0 refills | Status: DC

## 2018-10-13 MED ORDER — FUROSEMIDE 20 MG PO TABS: 20.0000 mg | ORAL_TABLET | Freq: Every day | ORAL | 0 refills | Status: DC

## 2018-10-13 MED ORDER — PANTOPRAZOLE SODIUM 20 MG PO TBEC: 20.0000 mg | DELAYED_RELEASE_TABLET | Freq: Every day | ORAL | 0 refills | Status: DC

## 2018-10-13 NOTE — Telephone Encounter (Signed)
Plavix Last rx:  07/16/18, #90 Last OV:  07/16/18, DM f/u Next OV:  12/09/18, CPE Pt 2  Updated pt's pharmacy list.  E-scribed other refills.

## 2018-10-13 NOTE — Telephone Encounter (Signed)
Patient is switching her pharmacies from [facility] to [facility]. Patient's out of Tetoprolol, Pantoprazole, Furosemide, Clopivogrel. Patient [facility] and she doesn't want the child proof tops on her bottles.

## 2018-10-14 MED ORDER — CLOPIDOGREL BISULFATE 75 MG PO TABS: 75.0000 mg | ORAL_TABLET | Freq: Every day | ORAL | 1 refills | Status: DC

## 2018-10-15 NOTE — Telephone Encounter (Signed)
Patient called back and stated she is needing [facility] for  Gapapentin 100 mg Clopidogrel 75mg  Metformin 500 mg Atorvastatin 40 mg   [facility]

## 2018-10-16 MED ORDER — ATORVASTATIN CALCIUM 40 MG PO TABS: 40.0000 mg | ORAL_TABLET | Freq: Every day | ORAL | 0 refills | Status: DC

## 2018-10-16 NOTE — Telephone Encounter (Signed)
Patient called back, stated she received everything but the ATORVASTATIN.   [facility] [place]

## 2018-10-16 NOTE — Telephone Encounter (Signed)
Patient called back to check the status of medication request    C/B # [phone]

## 2018-10-16 NOTE — Telephone Encounter (Signed)
Noted.  E-scribed [facility]-day refill to [facility] [address].

## 2018-11-10 ENCOUNTER — Ambulatory Visit: Payer: Medicare HMO | Admitting: Physician Assistant

## 2018-12-01 ENCOUNTER — Other Ambulatory Visit: Payer: Self-pay

## 2018-12-01 ENCOUNTER — Ambulatory Visit (INDEPENDENT_AMBULATORY_CARE_PROVIDER_SITE_OTHER): Payer: Medicare Other | Admitting: Podiatry

## 2018-12-01 ENCOUNTER — Encounter: Payer: Self-pay | Admitting: Podiatry

## 2018-12-01 DIAGNOSIS — M79675 Pain in left toe(s): Secondary | ICD-10-CM

## 2018-12-01 DIAGNOSIS — M79674 Pain in right toe(s): Secondary | ICD-10-CM

## 2018-12-01 DIAGNOSIS — B351 Tinea unguium: Secondary | ICD-10-CM

## 2018-12-01 NOTE — Patient Instructions (Signed)
Diabetes Mellitus and Foot Care Foot care is an important part of your health, especially when you have diabetes. Diabetes may cause you to have problems because of poor blood flow (circulation) to your feet and legs, which can cause your skin to:  Become thinner and drier.  Break more easily.  Heal more slowly.  Peel and crack. You may also have nerve damage (neuropathy) in your legs and feet, causing decreased feeling in them. This means that you may not notice minor injuries to your feet that could lead to more serious problems. Noticing and addressing any potential problems early is the best way to prevent future foot problems. How to care for your feet Foot hygiene  Wash your feet daily with warm water and mild soap. Do not use hot water. Then, pat your feet and the areas between your toes until they are completely dry. Do not soak your feet as this can dry your skin.  Trim your toenails straight across. Do not dig under them or around the cuticle. File the edges of your nails with an emery board or nail file.  Apply a moisturizing lotion or petroleum jelly to the skin on your feet and to dry, brittle toenails. Use lotion that does not contain alcohol and is unscented. Do not apply lotion between your toes. Shoes and socks  Wear clean socks or stockings every day. Make sure they are not too tight. Do not wear knee-high stockings since they may decrease blood flow to your legs.  Wear shoes that fit properly and have enough cushioning. Always look in your shoes before you put them on to be sure there are no objects inside.  To break in new shoes, wear them for just a few hours a day. This prevents injuries on your feet. Wounds, scrapes, corns, and calluses  Check your feet daily for blisters, cuts, bruises, sores, and redness. If you cannot see the bottom of your feet, use a mirror or ask someone for help.  Do not cut corns or calluses or try to remove them with medicine.  If you  find a minor scrape, cut, or break in the skin on your feet, keep it and the skin around it clean and dry. You may clean these areas with mild soap and water. Do not clean the area with peroxide, alcohol, or iodine.  If you have a wound, scrape, corn, or callus on your foot, look at it several times a day to make sure it is healing and not infected. Check for: ? Redness, swelling, or pain. ? Fluid or blood. ? Warmth. ? Pus or a bad smell. General instructions  Do not cross your legs. This may decrease blood flow to your feet.  Do not use heating pads or hot water bottles on your feet. They may burn your skin. If you have lost feeling in your feet or legs, you may not know this is happening until it is too late.  Protect your feet from hot and cold by wearing shoes, such as at the beach or on hot pavement.  Schedule a complete foot exam at least once a year (annually) or more often if you have foot problems. If you have foot problems, report any cuts, sores, or bruises to your health care provider immediately. Contact a health care provider if:  You have a medical condition that increases your risk of infection and you have any cuts, sores, or bruises on your feet.  You have an injury that is not   healing.  You have redness on your legs or feet.  You feel burning or tingling in your legs or feet.  You have pain or cramps in your legs and feet.  Your legs or feet are numb.  Your feet always feel cold.  You have pain around a toenail. Get help right away if:  You have a wound, scrape, corn, or callus on your foot and: ? You have pain, swelling, or redness that gets worse. ? You have fluid or blood coming from the wound, scrape, corn, or callus. ? Your wound, scrape, corn, or callus feels warm to the touch. ? You have pus or a bad smell coming from the wound, scrape, corn, or callus. ? You have a fever. ? You have a red line going up your leg. Summary  Check your feet every day  for cuts, sores, red spots, swelling, and blisters.  Moisturize feet and legs daily.  Wear shoes that fit properly and have enough cushioning.  If you have foot problems, report any cuts, sores, or bruises to your health care provider immediately.  Schedule a complete foot exam at least once a year (annually) or more often if you have foot problems. This information is not intended to replace advice given to you by your health care provider. Make sure you discuss any questions you have with your health care provider. Document Released: 02/23/2000 Document Revised: 04/09/2017 Document Reviewed: 03/29/2016 Elsevier Patient Education  2020 [employer]  

## 2018-12-03 ENCOUNTER — Other Ambulatory Visit: Payer: Self-pay | Admitting: Family Medicine

## 2018-12-03 DIAGNOSIS — I10 Essential (primary) hypertension: Secondary | ICD-10-CM

## 2018-12-03 DIAGNOSIS — K760 Fatty (change of) liver, not elsewhere classified: Secondary | ICD-10-CM

## 2018-12-03 DIAGNOSIS — IMO0002 Reserved for concepts with insufficient information to code with codable children: Secondary | ICD-10-CM

## 2018-12-03 DIAGNOSIS — E538 Deficiency of other specified B group vitamins: Secondary | ICD-10-CM

## 2018-12-03 DIAGNOSIS — E7800 Pure hypercholesterolemia, unspecified: Secondary | ICD-10-CM

## 2018-12-03 DIAGNOSIS — E611 Iron deficiency: Secondary | ICD-10-CM

## 2018-12-03 DIAGNOSIS — E1149 Type 2 diabetes mellitus with other diabetic neurological complication: Secondary | ICD-10-CM

## 2018-12-04 ENCOUNTER — Ambulatory Visit: Payer: Medicare Other

## 2018-12-04 ENCOUNTER — Other Ambulatory Visit (INDEPENDENT_AMBULATORY_CARE_PROVIDER_SITE_OTHER): Payer: Medicare Other

## 2018-12-04 ENCOUNTER — Other Ambulatory Visit: Payer: Medicare Other

## 2018-12-04 ENCOUNTER — Ambulatory Visit: Payer: Medicare HMO

## 2018-12-04 DIAGNOSIS — E538 Deficiency of other specified B group vitamins: Secondary | ICD-10-CM | POA: Diagnosis not present

## 2018-12-04 DIAGNOSIS — IMO0002 Reserved for concepts with insufficient information to code with codable children: Secondary | ICD-10-CM

## 2018-12-04 DIAGNOSIS — E1149 Type 2 diabetes mellitus with other diabetic neurological complication: Secondary | ICD-10-CM | POA: Diagnosis not present

## 2018-12-04 DIAGNOSIS — E1165 Type 2 diabetes mellitus with hyperglycemia: Secondary | ICD-10-CM

## 2018-12-04 DIAGNOSIS — E611 Iron deficiency: Secondary | ICD-10-CM

## 2018-12-04 DIAGNOSIS — E7800 Pure hypercholesterolemia, unspecified: Secondary | ICD-10-CM | POA: Diagnosis not present

## 2018-12-04 LAB — CBC WITH DIFFERENTIAL/PLATELET
Basophils Absolute: 0.1 10*3/uL (ref 0.0–0.1)
Basophils Relative: 1.3 % (ref 0.0–3.0)
Eosinophils Absolute: 0.4 10*3/uL (ref 0.0–0.7)
Eosinophils Relative: 5.1 % — ABNORMAL HIGH (ref 0.0–5.0)
HCT: 37.5 % (ref 36.0–46.0)
Hemoglobin: 12.2 g/dL (ref 12.0–15.0)
Lymphocytes Relative: 26.2 % (ref 12.0–46.0)
Lymphs Abs: 1.8 10*3/uL (ref 0.7–4.0)
MCHC: 32.7 g/dL (ref 30.0–36.0)
MCV: 93.9 fl (ref 78.0–100.0)
Monocytes Absolute: 0.4 10*3/uL (ref 0.1–1.0)
Monocytes Relative: 5.8 % (ref 3.0–12.0)
Neutro Abs: 4.3 10*3/uL (ref 1.4–7.7)
Neutrophils Relative %: 61.6 % (ref 43.0–77.0)
Platelets: 213 10*3/uL (ref 150.0–400.0)
RBC: 3.99 Mil/uL (ref 3.87–5.11)
RDW: 14.2 % (ref 11.5–15.5)
WBC: 6.9 10*3/uL (ref 4.0–10.5)

## 2018-12-04 LAB — BASIC METABOLIC PANEL
BUN: 19 mg/dL (ref 6–23)
CO2: 24 mEq/L (ref 19–32)
Calcium: 9.2 mg/dL (ref 8.4–10.5)
Chloride: 104 mEq/L (ref 96–112)
Creatinine, Ser: 1.19 mg/dL (ref 0.40–1.20)
GFR: 44.14 mL/min — ABNORMAL LOW (ref >60.00)
Glucose, Bld: 335 mg/dL — ABNORMAL HIGH (ref 70–99)
Potassium: 4.7 mEq/L (ref 3.5–5.1)
Sodium: 139 mEq/L (ref 135–145)

## 2018-12-04 LAB — LIPID PANEL
Cholesterol: 93 mg/dL (ref 0–200)
HDL: 32.1 mg/dL — ABNORMAL LOW (ref >39.00)
LDL Cholesterol: 22 mg/dL (ref 0–99)
NonHDL: 60.49
Total CHOL/HDL Ratio: 3
Triglycerides: 190 mg/dL — ABNORMAL HIGH (ref 0.0–149.0)
VLDL: 38 mg/dL (ref 0.0–40.0)

## 2018-12-04 LAB — HEMOGLOBIN A1C: Hgb A1c MFr Bld: 9 % — ABNORMAL HIGH (ref 4.6–6.5)

## 2018-12-04 LAB — VITAMIN B12: Vitamin B-12: 927 pg/mL — ABNORMAL HIGH (ref 211–911)

## 2018-12-06 NOTE — Progress Notes (Signed)
Subjective: Maria Fox is seen today for follow up painful, elongated, thickened toenails 1-5 b/l feet that she cannot cut. Pain interferes with daily activities. Aggravating factor includes wearing enclosed shoe gear and relieved with periodic debridement.  Current Outpatient Medications on File Prior to Visit  Medication Sig  . acetaminophen (TYLENOL) 500 MG tablet Take 500 mg by mouth as needed.  Marland Kitchen albuterol (PROAIR HFA) 108 (90 Base) MCG/ACT inhaler Inhale 2 puffs into the lungs every 6 (six) hours as needed for wheezing or shortness of breath.  Marland Kitchen aspirin EC 81 MG tablet Take 81 mg by mouth daily.   Marland Kitchen atorvastatin (LIPITOR) 40 MG tablet Take 1 tablet (40 mg total) by mouth at bedtime.  . clopidogrel (PLAVIX) 75 MG tablet Take 1 tablet (75 mg total) by mouth daily.  . Cyanocobalamin (B-12) 1000 MCG SUBL Place 1 tablet under the tongue daily.  Marland Kitchen docusate sodium (COLACE) 100 MG capsule Take 1 capsule (100 mg total) by mouth daily.  . furosemide (LASIX) 20 MG tablet Take 1 tablet (20 mg total) by mouth daily.  Marland Kitchen gabapentin (NEURONTIN) 100 MG capsule Take one tablet in am, one in afternoon, three in evening  . glimepiride (AMARYL) 2 MG tablet Take 1 tablet (2 mg total) by mouth daily before breakfast.  . latanoprost (XALATAN) 0.005 % ophthalmic solution   . metFORMIN (GLUCOPHAGE) 500 MG tablet Take 1 tablet (500 mg total) by mouth 3 (three) times daily before meals.  . metoCLOPramide (REGLAN) 10 MG tablet Take 1 tablet (10 mg total) by mouth every 6 (six) hours as needed for nausea or vomiting.  . metoprolol tartrate (LOPRESSOR) 25 MG tablet TAKE ONE-HALF TABLET BY MOUTH DAILY  . NON FORMULARY Antifungal cream from [place]  . pantoprazole (PROTONIX) 20 MG tablet Take 1 tablet (20 mg total) by mouth daily.  . traZODone (DESYREL) 50 MG tablet Take 1.5 tablets (75 mg total) by mouth at bedtime as needed for sleep.   No current facility-administered medications on file prior to visit.       Allergies  Allergen Reactions  . Mushroom Extract Complex Anaphylaxis  . Codeine Nausea And Vomiting  . Penicillins Hives and Swelling    Has patient had a PCN reaction causing immediate rash, facial/tongue/throat swelling, SOB or lightheadedness with hypotension: Yes Has patient had a PCN reaction causing severe rash involving mucus membranes or skin necrosis: Yes Has patient had a PCN reaction that required hospitalization Yes Has patient had a PCN reaction occurring within the last 10 years: No If all of the above answers are "NO", then may proceed with Cephalosporin use.   . Sulfa Antibiotics Nausea And Vomiting  . Bee Venom     Throat swelling  . Erythromycin Base Rash     Objective:  Vascular Examination: Capillary refill time <3 seconds x 10 digits.   Dorsalis pedis present b/l.  Posterior tibial pulses present b/l.  Digital hair sparse x 10 digits.  Skin temperature gradient WNL b/l.   Dermatological Examination: Skin with normal turgor, texture and tone b/l.  Toenails 2-5 b/l discolored, thick, dystrophic with subungual debris and pain with palpation to nailbeds due to thickness of nails.  Anonychia b/l great toes with evidence of permanent total nail avulsion. Nailbeds completely epithelialized and intact.  Musculoskeletal: Muscle strength 5/5 to all LE muscle groups.  No gross bony deformities b/l.  No pain, crepitus or joint limitation noted with ROM.   Neurological Examination: Protective sensation intact with 10 gram monofilament  bilaterally.  Assessment: Painful onychomycosis toenails 2-5 b/l  NIDDM  Plan: 1. Toenails 2-5 b/l were debrided in length and girth without iatrogenic bleeding. 2. Patient to continue soft, supportive shoe gear 3. Patient to report any pedal injuries to medical professional immediately. 4. Follow up 3 months.  5. Patient/POA to call should there be a concern in the interim.

## 2018-12-08 ENCOUNTER — Ambulatory Visit (INDEPENDENT_AMBULATORY_CARE_PROVIDER_SITE_OTHER): Payer: Medicare Other

## 2018-12-08 DIAGNOSIS — Z0000 Encounter for general adult medical examination without abnormal findings: Secondary | ICD-10-CM

## 2018-12-08 NOTE — Patient Instructions (Signed)
Maria Fox , Thank you for taking time to come for your Medicare Wellness Visit. I appreciate your ongoing commitment to your health goals. Please review the following plan we discussed and let me know if I can assist you in the future.   Screening recommendations/referrals: Colonoscopy: declined Mammogram: up to date, completed 02/06/2017 Bone Density: up to date, completed 01/29/2017 Recommended yearly ophthalmology/optometry visit for glaucoma screening and checkup Recommended yearly dental visit for hygiene and checkup  Vaccinations: Influenza vaccine: will get at next office visit  Pneumococcal vaccine: series completed Tdap vaccine: declined Shingles vaccine: declined    Advanced directives: Advance directive discussed with you today. Even though you declined this today please call our office should you change your mind and we can give you the proper paperwork for you to fill out.   Conditions/risks identified: diabetes, hypertension, hyperlipidemia  Next appointment: 12/14/2018 @ 3 pm   Preventive Care 65 Years and Older, Female Preventive care refers to lifestyle choices and visits with your health care provider that can promote health and wellness. What does preventive care include?  A yearly physical exam. This is also called an annual well check.  Dental exams once or twice a year.  Routine eye exams. Ask your health care provider how often you should have your eyes checked.  Personal lifestyle choices, including:  Daily care of your teeth and gums.  Regular physical activity.  Eating a healthy diet.  Avoiding tobacco and drug use.  Limiting alcohol use.  Practicing safe sex.  Taking low-dose aspirin every day.  Taking vitamin and mineral supplements as recommended by your health care provider. What happens during an annual well check? The services and screenings done by your health care provider during your annual well check will depend on your age,  overall health, lifestyle risk factors, and family history of disease. Counseling  Your health care provider may ask you questions about your:  Alcohol use.  Tobacco use.  Drug use.  Emotional well-being.  Home and relationship well-being.  Sexual activity.  Eating habits.  History of falls.  Memory and ability to understand (cognition).  Work and work Statistician.  Reproductive health. Screening  You may have the following tests or measurements:  Height, weight, and BMI.  Blood pressure.  Lipid and cholesterol levels. These may be checked every 5 years, or more frequently if you are over 48 years old.  Skin check.  Lung cancer screening. You may have this screening every year starting at age 60 if you have a 30-pack-year history of smoking and currently smoke or have quit within the past 15 years.  Fecal occult blood test (FOBT) of the stool. You may have this test every year starting at age 10.  Flexible sigmoidoscopy or colonoscopy. You may have a sigmoidoscopy every 5 years or a colonoscopy every 10 years starting at age 46.  Hepatitis C blood test.  Hepatitis B blood test.  Sexually transmitted disease (STD) testing.  Diabetes screening. This is done by checking your blood sugar (glucose) after you have not eaten for a while (fasting). You may have this done every 1-3 years.  Bone density scan. This is done to screen for osteoporosis. You may have this done starting at age 65.  Mammogram. This may be done every 1-2 years. Talk to your health care provider about how often you should have regular mammograms. Talk with your health care provider about your test results, treatment options, and if necessary, the need for more tests. Vaccines  Your health care provider may recommend certain vaccines, such as:  Influenza vaccine. This is recommended every year.  Tetanus, diphtheria, and acellular pertussis (Tdap, Td) vaccine. You may need a Td booster every 10  years.  Zoster vaccine. You may need this after age 65.  Pneumococcal 13-valent conjugate (PCV13) vaccine. One dose is recommended after age 14.  Pneumococcal polysaccharide (PPSV23) vaccine. One dose is recommended after age 45. Talk to your health care provider about which screenings and vaccines you need and how often you need them. This information is not intended to replace advice given to you by your health care provider. Make sure you discuss any questions you have with your health care provider. Document Released: 03/24/2015 Document Revised: 11/15/2015 Document Reviewed: 12/27/2014 Elsevier Interactive Patient Education  2017 [place] Prevention in the Home Falls can cause injuries. They can happen to people of all ages. There are many things you can do to make your home safe and to help prevent falls. What can I do on the outside of my home?  Regularly fix the edges of walkways and driveways and fix any cracks.  Remove anything that might make you trip as you walk through a door, such as a raised step or threshold.  Trim any bushes or trees on the path to your home.  Use bright outdoor lighting.  Clear any walking paths of anything that might make someone trip, such as rocks or tools.  Regularly check to see if handrails are loose or broken. Make sure that both sides of any steps have handrails.  Any raised decks and porches should have guardrails on the edges.  Have any leaves, snow, or ice cleared regularly.  Use sand or salt on walking paths during winter.  Clean up any spills in your garage right away. This includes oil or grease spills. What can I do in the bathroom?  Use night lights.  Install grab bars by the toilet and in the tub and shower. Do not use towel bars as grab bars.  Use non-skid mats or decals in the tub or shower.  If you need to sit down in the shower, use a plastic, non-slip stool.  Keep the floor dry. Clean up any water that  spills on the floor as soon as it happens.  Remove soap buildup in the tub or shower regularly.  Attach bath mats securely with double-sided non-slip rug tape.  Do not have throw rugs and other things on the floor that can make you trip. What can I do in the bedroom?  Use night lights.  Make sure that you have a light by your bed that is easy to reach.  Do not use any sheets or blankets that are too big for your bed. They should not hang down onto the floor.  Have a firm chair that has side arms. You can use this for support while you get dressed.  Do not have throw rugs and other things on the floor that can make you trip. What can I do in the kitchen?  Clean up any spills right away.  Avoid walking on wet floors.  Keep items that you use a lot in easy-to-reach places.  If you need to reach something above you, use a strong step stool that has a grab bar.  Keep electrical cords out of the way.  Do not use floor polish or wax that makes floors slippery. If you must use wax, use non-skid floor wax.  Do  not have throw rugs and other things on the floor that can make you trip. What can I do with my stairs?  Do not leave any items on the stairs.  Make sure that there are handrails on both sides of the stairs and use them. Fix handrails that are broken or loose. Make sure that handrails are as long as the stairways.  Check any carpeting to make sure that it is firmly attached to the stairs. Fix any carpet that is loose or worn.  Avoid having throw rugs at the top or bottom of the stairs. If you do have throw rugs, attach them to the floor with carpet tape.  Make sure that you have a light switch at the top of the stairs and the bottom of the stairs. If you do not have them, ask someone to add them for you. What else can I do to help prevent falls?  Wear shoes that:  Do not have high heels.  Have rubber bottoms.  Are comfortable and fit you well.  Are closed at the  toe. Do not wear sandals.  If you use a stepladder:  Make sure that it is fully opened. Do not climb a closed stepladder.  Make sure that both sides of the stepladder are locked into place.  Ask someone to hold it for you, if possible.  Clearly mark and make sure that you can see:  Any grab bars or handrails.  First and last steps.  Where the edge of each step is.  Use tools that help you move around (mobility aids) if they are needed. These include:  Canes.  Walkers.  Scooters.  Crutches.  Turn on the lights when you go into a dark area. Replace any light bulbs as soon as they burn out.  Set up your furniture so you have a clear path. Avoid moving your furniture around.  If any of your floors are uneven, fix them.  If there are any pets around you, be aware of where they are.  Review your medicines with your doctor. Some medicines can make you feel dizzy. This can increase your chance of falling. Ask your doctor what other things that you can do to help prevent falls. This information is not intended to replace advice given to you by your health care provider. Make sure you discuss any questions you have with your health care provider. Document Released: 12/22/2008 Document Revised: 08/03/2015 Document Reviewed: 04/01/2014 Elsevier Interactive Patient Education  2017 [employer].

## 2018-12-08 NOTE — Progress Notes (Signed)
PCP notes: none  Health Maintenance: Patient will get flu vaccine at next office visit. Patient declined colonoscopy, Tdap and Shingrix.    Abnormal Screenings: none    Patient concerns:none   Nurse concerns: none    Next PCP appt.: 12/14/2018 @ 3 pm
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
## 2018-12-09 ENCOUNTER — Encounter: Payer: Medicare HMO | Admitting: Family Medicine

## 2018-12-10 HISTORY — PX: CARDIOVASCULAR STRESS TEST: SHX262

## 2018-12-11 ENCOUNTER — Other Ambulatory Visit: Payer: Self-pay

## 2018-12-11 ENCOUNTER — Encounter: Payer: Self-pay | Admitting: Family Medicine

## 2018-12-11 ENCOUNTER — Ambulatory Visit (INDEPENDENT_AMBULATORY_CARE_PROVIDER_SITE_OTHER): Payer: Medicare Other | Admitting: Family Medicine

## 2018-12-11 VITALS — BP 126/86 | HR 78 | Temp 97.6°F | Ht 63.0 in | Wt 156.1 lb

## 2018-12-11 DIAGNOSIS — Z0000 Encounter for general adult medical examination without abnormal findings: Secondary | ICD-10-CM | POA: Diagnosis not present

## 2018-12-11 DIAGNOSIS — I2510 Atherosclerotic heart disease of native coronary artery without angina pectoris: Secondary | ICD-10-CM | POA: Diagnosis not present

## 2018-12-11 DIAGNOSIS — M8580 Other specified disorders of bone density and structure, unspecified site: Secondary | ICD-10-CM

## 2018-12-11 DIAGNOSIS — N183 Chronic kidney disease, stage 3 unspecified: Secondary | ICD-10-CM

## 2018-12-11 DIAGNOSIS — L80 Vitiligo: Secondary | ICD-10-CM

## 2018-12-11 DIAGNOSIS — K219 Gastro-esophageal reflux disease without esophagitis: Secondary | ICD-10-CM

## 2018-12-11 DIAGNOSIS — I2583 Coronary atherosclerosis due to lipid rich plaque: Secondary | ICD-10-CM

## 2018-12-11 DIAGNOSIS — I10 Essential (primary) hypertension: Secondary | ICD-10-CM

## 2018-12-11 DIAGNOSIS — I5032 Chronic diastolic (congestive) heart failure: Secondary | ICD-10-CM | POA: Diagnosis not present

## 2018-12-11 DIAGNOSIS — Z8673 Personal history of transient ischemic attack (TIA), and cerebral infarction without residual deficits: Secondary | ICD-10-CM

## 2018-12-11 DIAGNOSIS — E538 Deficiency of other specified B group vitamins: Secondary | ICD-10-CM

## 2018-12-11 DIAGNOSIS — IMO0002 Reserved for concepts with insufficient information to code with codable children: Secondary | ICD-10-CM

## 2018-12-11 DIAGNOSIS — K760 Fatty (change of) liver, not elsewhere classified: Secondary | ICD-10-CM

## 2018-12-11 DIAGNOSIS — E1122 Type 2 diabetes mellitus with diabetic chronic kidney disease: Secondary | ICD-10-CM

## 2018-12-11 DIAGNOSIS — Z23 Encounter for immunization: Secondary | ICD-10-CM

## 2018-12-11 DIAGNOSIS — E785 Hyperlipidemia, unspecified: Secondary | ICD-10-CM

## 2018-12-11 DIAGNOSIS — E1149 Type 2 diabetes mellitus with other diabetic neurological complication: Secondary | ICD-10-CM

## 2018-12-11 DIAGNOSIS — Z7189 Other specified counseling: Secondary | ICD-10-CM | POA: Diagnosis not present

## 2018-12-11 DIAGNOSIS — F332 Major depressive disorder, recurrent severe without psychotic features: Secondary | ICD-10-CM

## 2018-12-11 DIAGNOSIS — E113413 Type 2 diabetes mellitus with severe nonproliferative diabetic retinopathy with macular edema, bilateral: Secondary | ICD-10-CM

## 2018-12-11 DIAGNOSIS — E1169 Type 2 diabetes mellitus with other specified complication: Secondary | ICD-10-CM

## 2018-12-11 DIAGNOSIS — E1165 Type 2 diabetes mellitus with hyperglycemia: Secondary | ICD-10-CM

## 2018-12-11 DIAGNOSIS — G4700 Insomnia, unspecified: Secondary | ICD-10-CM

## 2018-12-11 MED ORDER — GLIPIZIDE 5 MG PO TABS: 5.0000 mg | ORAL_TABLET | Freq: Two times a day (BID) | ORAL | 1 refills | Status: DC

## 2018-12-11 MED ORDER — DULOXETINE HCL 20 MG PO CPEP: 20.0000 mg | ORAL_CAPSULE | Freq: Every day | ORAL | 11 refills | Status: DC

## 2018-12-11 NOTE — Assessment & Plan Note (Addendum)
Stable period on lasix 20mg  daily. Continue. Seems euvolemic.

## 2018-12-11 NOTE — Assessment & Plan Note (Signed)
Advanced directive discussion - does not have set up. Would want [name] or brother [name] to be HCPOA. Packet provided today.

## 2018-12-11 NOTE — Assessment & Plan Note (Signed)
Continues aspirin/plavix.

## 2018-12-11 NOTE — Assessment & Plan Note (Addendum)
Encouraged limited NSAIDs.

## 2018-12-11 NOTE — Assessment & Plan Note (Signed)
Notes worsening, but no psychotic symptoms. Prior on cymbalta, loxapine, olanzapine, and trazodone. Will restart cymbalta at 20mg  daily.

## 2018-12-11 NOTE — Assessment & Plan Note (Signed)
Preventative protocols reviewed and updated unless pt declined. Discussed healthy diet and lifestyle.  

## 2018-12-11 NOTE — Assessment & Plan Note (Signed)
Continue low dose PPI 

## 2018-12-11 NOTE — Assessment & Plan Note (Addendum)
Chronic, stable off medication. H/o hypotension when on antihypertensive

## 2018-12-11 NOTE — Assessment & Plan Note (Signed)
Encouraged regular weight bearing exercises.

## 2018-12-11 NOTE — Assessment & Plan Note (Signed)
Continue trazodone PRN.  

## 2018-12-11 NOTE — Assessment & Plan Note (Signed)
Continue b12 replacement.  

## 2018-12-11 NOTE — Assessment & Plan Note (Signed)
Chronic, stable on statin. Continue. The ASCVD Risk score (Goff DC Jr., et al., 2013) failed to calculate for the following reasons:   The valid total cholesterol range is 130 to 320 mg/dL  

## 2018-12-11 NOTE — Progress Notes (Signed)
This visit was conducted in person.  BP 126/86 (BP Location: Left Arm, Patient Position: Sitting, Cuff Size: Normal)   Pulse 78   Temp 97.6 F (36.4 C) (Temporal)   Ht 5' 3" (1.6 m)   Wt 156 lb 2 oz (70.8 kg)   SpO2 99%   BMI 27.66 kg/m    CC: CPE Subjective:    Patient ID: Maria Fox, female    DOB: 11-16-1945, 73 y.o.   MRN: [id number]  HPI: Maria Fox is a 73 y.o. female presenting on 12/11/2018 for Annual Exam (Prt 2.)   Saw health advisor earlier this week for medicare wellness visit. Note reviewed.    No exam data present    Office Visit from 12/11/2018 in [place] at [facility] Total Score  2      Fall Risk  12/08/2018 12/26/2016  Falls in the past year? 0 Yes  Comment - pt slipped on water/oil spot in parking lot of [employer]; bruise to left palm  Number falls in past yr: 0 1  Injury with Fall? - Yes  Risk for fall due to : Medication side effect -  Follow up Falls evaluation completed;Falls prevention discussed -    Hospitalization 2019 with stent placement.   Preventative: Colon cancer screening - iFOB - will turn in today Lung cancer screening - not eligible Breast cancer screening - overdue. Advised to call to schedule mammo. She does breast exams at home without concerns.  Well woman exam - desires to age out. Denies pelvic pain or vaginal bleeding DEXA T -1.8 hip (01/2017) osteopenia  Flu shot yearly Td 2005 prevnar 2018, presumed pneumovax 2012 shingrix - discussed - to check at pharmacy - she had bad shingles illness 1990s (hospitalized) Advanced directive discussion - does not have set up. Would want Maria Fox or brother Maria Fox to be HCPOA. Packet provided today.  Seat belt use discussed Sunscreen use discussed, no changing moles on skin. 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 Interim medical history since our last visit reviewed. Allergies and medications reviewed and updated. Outpatient Medications Prior to Visit  Medication Sig Dispense Refill  . acetaminophen (TYLENOL) 500 MG tablet Take 500 mg by mouth as needed.    Marland Kitchen albuterol (PROAIR HFA) 108 (90 Base) MCG/ACT inhaler Inhale 2 puffs into the lungs every 6 (six) hours as needed for wheezing or shortness of breath. 1 Inhaler 1  . aspirin EC 81 MG tablet Take 81 mg by mouth daily.     Marland Kitchen atorvastatin (LIPITOR) 40 MG tablet Take 1 tablet (40 mg total) by mouth at bedtime. 90 tablet 0  . clopidogrel (PLAVIX) 75 MG tablet Take 1 tablet (75 mg total) by mouth daily. 90 tablet 1  . Cyanocobalamin (B-12) 1000 MCG SUBL Place 1 tablet under the tongue daily. 30 each   . docusate sodium (COLACE) 100 MG capsule Take 1 capsule (100 mg total) by mouth daily. (Patient taking differently: Take 100 mg by mouth daily as needed. ) 30 capsule 6  . furosemide (LASIX) 20 MG tablet Take 1 tablet (20  mg total) by mouth daily. 90 tablet 0  . gabapentin (NEURONTIN) 100 MG capsule Take one tablet in am, one in afternoon, three in evening 450 capsule 1  . latanoprost (XALATAN) 0.005 % ophthalmic solution     . metFORMIN (GLUCOPHAGE) 500 MG tablet Take 1 tablet (500 mg total) by mouth 3 (three) times daily before meals. 270 tablet 1  . metoCLOPramide (REGLAN) 10 MG tablet Take 1 tablet (10 mg total) by mouth every 6 (six) hours as needed for nausea or vomiting. 120 tablet 3  . metoprolol tartrate (LOPRESSOR) 25 MG tablet TAKE ONE-HALF TABLET BY MOUTH DAILY 45 tablet 0  . NON FORMULARY Antifungal cream from [place] as needed    . pantoprazole (PROTONIX) 20 MG  tablet Take 1 tablet (20 mg total) by mouth daily. 90 tablet 0  . traZODone (DESYREL) 50 MG tablet Take 1.5 tablets (75 mg total) by mouth at bedtime as needed for sleep. 135 tablet 1  . glimepiride (AMARYL) 2 MG tablet Take 1 tablet (2 mg total) by mouth daily before breakfast. 90 tablet 1   No facility-administered medications prior to visit.      Per HPI unless specifically indicated in ROS section below Review of Systems  Constitutional: Negative for activity change, appetite change, chills, fatigue, fever and unexpected weight change.  HENT: Negative for hearing loss.   Eyes: Negative for visual disturbance.  Respiratory: Positive for shortness of breath (mild intermittent) and wheezing. Negative for cough and chest tightness.   Cardiovascular: Negative for chest pain, palpitations and leg swelling.  Gastrointestinal: Positive for nausea (mild). Negative for abdominal distention, abdominal pain, blood in stool, constipation, diarrhea and vomiting.  Genitourinary: Negative for difficulty urinating and hematuria.  Musculoskeletal: Negative for arthralgias, myalgias and neck pain.  Skin: Negative for rash.  Neurological: Positive for headaches (R sided worse with turning head). Negative for dizziness, seizures and syncope.  Hematological: Negative for adenopathy. Bruises/bleeds easily.  Psychiatric/Behavioral: Positive for dysphoric mood. The patient is nervous/anxious.    Objective:    BP 126/86 (BP Location: Left Arm, Patient Position: Sitting, Cuff Size: Normal)   Pulse 78   Temp 97.6 F (36.4 C) (Temporal)   Ht 5' 3" (1.6 m)   Wt 156 lb 2 oz (70.8 kg)   SpO2 99%   BMI 27.66 kg/m   Wt Readings from Last 3 Encounters:  12/11/18 156 lb 2 oz (70.8 kg)  01/13/18 163 lb 3.2 oz (74 kg)  12/02/17 161 lb (73 kg)    Physical Exam Vitals signs and nursing note reviewed.  Constitutional:      General: She is not in acute distress.    Appearance: Normal appearance. She is  well-developed. She is not ill-appearing.  HENT:     Head: Normocephalic and atraumatic.     Right Ear: Hearing, tympanic membrane, ear canal and external ear normal.     Left Ear: Hearing, tympanic membrane, ear canal and external ear normal.     Nose: Nose normal.     Mouth/Throat:     Mouth: Mucous membranes are moist.     Pharynx: Oropharynx is clear. Uvula midline. No posterior oropharyngeal erythema.  Eyes:     General: No scleral icterus.    Conjunctiva/sclera: Conjunctivae normal.     Pupils: Pupils are equal, round, and reactive to light.  Neck:     Musculoskeletal: Normal range of motion and neck supple.     Vascular: No carotid bruit.  Cardiovascular:  Rate and Rhythm: Normal rate and regular rhythm.     Pulses: Normal pulses.          Radial pulses are 2+ on the right side and 2+ on the left side.     Heart sounds: Normal heart sounds. No murmur.  Pulmonary:     Effort: Pulmonary effort is normal. No respiratory distress.     Breath sounds: Normal breath sounds. No wheezing, rhonchi or rales.  Abdominal:     General: Abdomen is flat. Bowel sounds are normal. There is no distension.     Palpations: Abdomen is soft. There is no mass.     Tenderness: There is no abdominal tenderness. There is no guarding or rebound.     Hernia: No hernia is present.  Musculoskeletal: Normal range of motion.     Right lower leg: No edema.     Left lower leg: No edema.  Lymphadenopathy:     Cervical: No cervical adenopathy.  Skin:    General: Skin is warm and dry.     Findings: No rash.     Comments: Vitiligo present  Neurological:     General: No focal deficit present.     Mental Status: She is alert and oriented to person, place, and time.     Comments: CN grossly intact, station and gait intact  Psychiatric:        Mood and Affect: Mood normal.        Behavior: Behavior normal.        Thought Content: Thought content normal.        Judgment: Judgment normal.        Results for orders placed or performed in visit on 12/04/18  CBC with Differential/Platelet  Result Value Ref Range   WBC 6.9 4.0 - 10.5 K/uL   RBC 3.99 3.87 - 5.11 Mil/uL   Hemoglobin 12.2 12.0 - 15.0 g/dL   HCT 37.5 36.0 - 46.0 %   MCV 93.9 78.0 - 100.0 fl   MCHC 32.7 30.0 - 36.0 g/dL   RDW 14.2 11.5 - 15.5 %   Platelets 213.0 150.0 - 400.0 K/uL   Neutrophils Relative % 61.6 43.0 - 77.0 %   Lymphocytes Relative 26.2 12.0 - 46.0 %   Monocytes Relative 5.8 3.0 - 12.0 %   Eosinophils Relative 5.1 (H) 0.0 - 5.0 %   Basophils Relative 1.3 0.0 - 3.0 %   Neutro Abs 4.3 1.4 - 7.7 K/uL   Lymphs Abs 1.8 0.7 - 4.0 K/uL   Monocytes Absolute 0.4 0.1 - 1.0 K/uL   Eosinophils Absolute 0.4 0.0 - 0.7 K/uL   Basophils Absolute 0.1 0.0 - 0.1 K/uL  Lipid panel  Result Value Ref Range   Cholesterol 93 0 - 200 mg/dL   Triglycerides 190.0 (H) 0.0 - 149.0 mg/dL   HDL 32.10 (L) >39.00 mg/dL   VLDL 38.0 0.0 - 40.0 mg/dL   LDL Cholesterol 22 0 - 99 mg/dL   Total CHOL/HDL Ratio 3    NonHDL 60.49   Hemoglobin A1c  Result Value Ref Range   Hgb A1c MFr Bld 9.0 (H) 4.6 - 6.5 %  Vitamin B12  Result Value Ref Range   Vitamin B-12 927 (H) 211 - 911 pg/mL  Basic metabolic panel  Result Value Ref Range   Sodium 139 135 - 145 mEq/L   Potassium 4.7 3.5 - 5.1 mEq/L   Chloride 104 96 - 112 mEq/L   CO2 24 19 - 32 mEq/L  Glucose, Bld 335 (H) 70 - 99 mg/dL   BUN 19 6 - 23 mg/dL   Creatinine, Ser 1.19 0.40 - 1.20 mg/dL   Calcium 9.2 8.4 - 10.5 mg/dL   GFR 44.14 (L) >60.00 mL/min   Depression screen [facility] 2/9 12/11/2018 12/08/2018 12/26/2016 05/29/2016  Decreased Interest 1 0 1 3  Down, Depressed, Hopeless 1 0 0 3  PHQ - 2 Score 2 0 1 6  Altered sleeping 1 0 1 3  Tired, decreased energy 1 0 2 3  Change in appetite 0 0 0 1  Feeling bad or failure about yourself  1 0 1 3  Trouble concentrating 1 0 2 3  Moving slowly or fidgety/restless 1 0 0 3  Suicidal thoughts 1 0 1 1  PHQ-9 Score 8 0 8 23  Difficult  doing work/chores - Not difficult at all Somewhat difficult -  Some recent data might be hidden   Assessment & Plan:   Problem List Items Addressed This Visit    Vitiligo (Chronic)   Vitamin B12 deficiency    Continue b12 replacement.       Type 2 diabetes mellitus with severe nonproliferative diabetic retinopathy with macular edema, bilateral ([place])    Sees eye doctors regularly.       Relevant Medications   glipiZIDE (GLUCOTROL) 5 MG tablet   Stage 3 chronic kidney disease due to type 2 diabetes mellitus ([place])    Encouraged limited NSAIDs.       Relevant Medications   glipiZIDE (GLUCOTROL) 5 MG tablet   Osteopenia    Encouraged regular weight bearing exercises.       MDD (major depressive disorder), recurrent severe, without psychosis ([place])    Notes worsening, but no psychotic symptoms. Prior on cymbalta, loxapine, olanzapine, and trazodone. Will restart cymbalta at 37m daily.       Relevant Medications   DULoxetine (CYMBALTA) 20 MG capsule   Insomnia    Continue trazodone PRN.       Hypertension, essential    Chronic, stable off medication. H/o hypotension when on antihypertensive      Hyperlipidemia associated with type 2 diabetes mellitus ([name])    Chronic, stable on statin. Continue. The ASCVD Risk score (Mikey BussingDC Jr., et al., 2013) failed to calculate for the following reasons:   The valid total cholesterol range is 130 to 320 mg/dL       Relevant Medications   glipiZIDE (GLUCOTROL) 5 MG tablet   History of cerebrovascular accident (CVA) in adulthood    Continues aspirin/plavix.       Health maintenance examination    Preventative protocols reviewed and updated unless pt declined. Discussed healthy diet and lifestyle.       GERD (gastroesophageal reflux disease)    Continue low dose PPI.       Fatty liver disease, nonalcoholic    Seen on prior imaging. LFTs stable.       DM (diabetes mellitus), type 2, uncontrolled w/ne[place]rologic complication ([place]     ?autonomic neuropathy. Worsened. Change glimepiride to glipizide 558mbid.  Need to discuss SGLT2 or GLP1 at next visit (affordability is a concern).       Relevant Medications   glipiZIDE (GLUCOTROL) 5 MG tablet   Coronary artery disease due to lipid rich plaque   Chronic diastolic CHF (congestive heart failure) ([name])    Stable period on lasix 2038maily. Continue. Seems euvolemic.       Advanced care planning/counseling discussion    Advanced directive  discussion - does not have set up. Would want Maria Fox or brother Maria Fox to be HCPOA. Packet provided today.        Other Visit Diagnoses    Need for influenza vaccination    -  Primary   Relevant Orders   Flu Vaccine QUAD High Dose(Fluad) (Completed)       Meds ordered this encounter  Medications  . DULoxetine (CYMBALTA) 20 MG capsule    Sig: Take 1 capsule (20 mg total) by mouth daily.    Dispense:  30 capsule    Refill:  11  . glipiZIDE (GLUCOTROL) 5 MG tablet    Sig: Take 1 tablet (5 mg total) by mouth 2 (two) times daily before a meal.    Dispense:  180 tablet    Refill:  1   Orders Placed This Encounter  Procedures  . Flu Vaccine QUAD High Dose(Fluad)    Patient instructions: Flu shot today Pass by lab to pick up stool kit.  Call the breast center to schedule mammogram. [phone] If interested, check with pharmacy about new 2 shot shingles series (shingrix).  Advanced directive packet provided today.  Call insurance or check with pharmacy to find out what your insurance's preferred glucose meter brand is and let me know.  Sugar was too high - change glimepiride 27m to glipizide 585mtwice daily with meals.  Return in 3-4 months for diabetes follow up visit.   Follow up plan: Return in about 4 months (around 04/13/2019) for follow up visit.  JaRia BushMD

## 2018-12-11 NOTE — Assessment & Plan Note (Addendum)
?  autonomic neuropathy. Worsened. Change glimepiride to glipizide 5mg  bid.  Need to discuss SGLT2 or GLP1 at next visit (affordability is a concern).

## 2018-12-11 NOTE — Assessment & Plan Note (Signed)
Seen on prior imaging. LFTs stable.

## 2018-12-11 NOTE — Patient Instructions (Addendum)
Flu shot today Pass by lab to pick up stool kit.  Call the breast center to schedule mammogram. [phone] If interested, check with pharmacy about new 2 shot shingles series (shingrix).  Advanced directive packet provided today.  Call insurance or check with pharmacy to find out what your insurance's preferred glucose meter brand is and let me know.  Sugar was too high - change glimepiride 36m to glipizide 551mtwice daily with meals.  Return in 3-4 months for diabetes follow up visit.   Health Maintenance After Age 3429fter age 412you are at a higher risk for certain long-term diseases and infections as well as injuries from falls. Falls are a major cause of broken bones and head injuries in people who are older than age 432Getting regular preventive care can help to keep you healthy and well. Preventive care includes getting regular testing and making lifestyle changes as recommended by your health care provider. Talk with your health care provider about:  Which screenings and tests you should have. A screening is a test that checks for a disease when you have no symptoms.  A diet and exercise plan that is right for you. What should I know about screenings and tests to prevent falls? Screening and testing are the best ways to find a health problem early. Early diagnosis and treatment give you the best chance of managing medical conditions that are common after age 437Certain conditions and lifestyle choices may make you more likely to have a fall. Your health care provider may recommend:  Regular vision checks. Poor vision and conditions such as cataracts can make you more likely to have a fall. If you wear glasses, make sure to get your prescription updated if your vision changes.  Medicine review. Work with your health care provider to regularly review all of the medicines you are taking, including over-the-counter medicines. Ask your health care provider about any side effects that  may make you more likely to have a fall. Tell your health care provider if any medicines that you take make you feel dizzy or sleepy.  Osteoporosis screening. Osteoporosis is a condition that causes the bones to get weaker. This can make the bones weak and cause them to break more easily.  Blood pressure screening. Blood pressure changes and medicines to control blood pressure can make you feel dizzy.  Strength and balance checks. Your health care provider may recommend certain tests to check your strength and balance while standing, walking, or changing positions.  Foot health exam. Foot pain and numbness, as well as not wearing proper footwear, can make you more likely to have a fall.  Depression screening. You may be more likely to have a fall if you have a fear of falling, feel emotionally low, or feel unable to do activities that you used to do.  Alcohol use screening. Using too much alcohol can affect your balance and may make you more likely to have a fall. What actions can I take to lower my risk of falls? General instructions  Talk with your health care provider about your risks for falling. Tell your health care provider if: ? You fall. Be sure to tell your health care provider about all falls, even ones that seem minor. ? You feel dizzy, sleepy, or off-balance.  Take over-the-counter and prescription medicines only as told by your health care provider. These include any supplements.  Eat a healthy diet and maintain a healthy weight. A healthy diet includes low-fat  dairy products, low-fat (lean) meats, and fiber from whole grains, beans, and lots of fruits and vegetables. Home safety  Remove any tripping hazards, such as rugs, cords, and clutter.  Install safety equipment such as grab bars in bathrooms and safety rails on stairs.  Keep rooms and walkways well-lit. Activity   Follow a regular exercise program to stay fit. This will help you maintain your balance. Ask your  health care provider what types of exercise are appropriate for you.  If you need a cane or walker, use it as recommended by your health care provider.  Wear supportive shoes that have nonskid soles. Lifestyle  Do not drink alcohol if your health care provider tells you not to drink.  If you drink alcohol, limit how much you have: ? 0-1 drink a day for women. ? 0-2 drinks a day for men.  Be aware of how much alcohol is in your drink. In the [place] one drink equals one typical bottle of beer (12 oz), one-half glass of wine (5 oz), or one shot of hard liquor (1 oz).  Do not use any products that contain nicotine or tobacco, such as cigarettes and e-cigarettes. If you need help quitting, ask your health care provider. Summary  Having a healthy lifestyle and getting preventive care can help to protect your health and wellness after age 49.  Screening and testing are the best way to find a health problem early and help you avoid having a fall. Early diagnosis and treatment give you the best chance for managing medical conditions that are more common for people who are older than age 66.  Falls are a major cause of broken bones and head injuries in people who are older than age 44. Take precautions to prevent a fall at home.  Work with your health care provider to learn what changes you can make to improve your health and wellness and to prevent falls. This information is not intended to replace advice given to you by your health care provider. Make sure you discuss any questions you have with your health care provider. Document Released: 01/08/2017 Document Revised: 06/18/2018 Document Reviewed: 01/08/2017 Elsevier Patient Education  2020 [employer].

## 2018-12-11 NOTE — Assessment & Plan Note (Signed)
Sees eye doctors regularly.

## 2018-12-14 ENCOUNTER — Telehealth: Payer: Self-pay | Admitting: Family Medicine

## 2018-12-14 ENCOUNTER — Encounter: Payer: Medicare Other | Admitting: Family Medicine

## 2018-12-14 DIAGNOSIS — IMO0002 Reserved for concepts with insufficient information to code with codable children: Secondary | ICD-10-CM

## 2018-12-14 DIAGNOSIS — E1149 Type 2 diabetes mellitus with other diabetic neurological complication: Secondary | ICD-10-CM

## 2018-12-14 MED ORDER — ONETOUCH ULTRA 2 W/DEVICE KIT: PACK | 0 refills | Status: DC

## 2018-12-14 MED ORDER — ONETOUCH ULTRA VI STRP: ORAL_STRIP | 0 refills | Status: DC

## 2018-12-14 MED ORDER — SHINGRIX 50 MCG/0.5ML IM SUSR: 0.5000 mL | Freq: Once | INTRAMUSCULAR | 1 refills | Status: AC

## 2018-12-14 MED ORDER — ONETOUCH DELICA LANCETS 33G MISC: 0 refills | Status: DC

## 2018-12-14 NOTE — Telephone Encounter (Signed)
E-scribed rx for OneTouch Ultra 2 meter, OneTouch Ultra test strips and OneTouch Delica lancets.  Pt needs rx for Shingrix sent to pharmacy.

## 2018-12-14 NOTE — Telephone Encounter (Signed)
Left message on vm per dpr notifying pt rx for glucose meter and supplies and Shingrix was sent to the pharmacy.

## 2018-12-14 NOTE — Telephone Encounter (Signed)
shingrix vaccine sent to pharmacy.

## 2018-12-14 NOTE — Telephone Encounter (Signed)
Patient stated she is needing her meter supplies sent in to her pharmacy,  She can either get the [place] or [place]  She is also needing a prescription for the Shingrix vaccine sent in also   [facility] [place]

## 2018-12-14 NOTE — Addendum Note (Signed)
Addended by: [name] on: 12/14/2018 05:09 PM   Modules accepted: Orders

## 2018-12-16 ENCOUNTER — Other Ambulatory Visit: Payer: Medicare Other

## 2018-12-16 ENCOUNTER — Telehealth: Payer: Self-pay | Admitting: Family Medicine

## 2018-12-16 NOTE — Telephone Encounter (Signed)
I left a voicemail asking patient to call office. She had dropped off a box that she had received in the mail and asked me to give it to [place] and let her know she didn't need it. When I took it to [place], she said patient needs to contact [facility] to return it. I have left it up front for her to come pick up so she can return it.

## 2018-12-18 ENCOUNTER — Other Ambulatory Visit: Payer: Self-pay | Admitting: Family Medicine

## 2018-12-18 ENCOUNTER — Other Ambulatory Visit (INDEPENDENT_AMBULATORY_CARE_PROVIDER_SITE_OTHER): Payer: Medicare Other

## 2018-12-18 DIAGNOSIS — Z1211 Encounter for screening for malignant neoplasm of colon: Secondary | ICD-10-CM

## 2018-12-18 LAB — FECAL OCCULT BLOOD, IMMUNOCHEMICAL: Fecal Occult Bld: POSITIVE — AB

## 2018-12-21 ENCOUNTER — Other Ambulatory Visit: Payer: Self-pay | Admitting: Family Medicine

## 2018-12-21 ENCOUNTER — Telehealth: Payer: Self-pay

## 2018-12-21 DIAGNOSIS — Z1211 Encounter for screening for malignant neoplasm of colon: Secondary | ICD-10-CM

## 2018-12-21 NOTE — Telephone Encounter (Signed)
Left message for patient to call back about lab results.

## 2018-12-22 NOTE — Telephone Encounter (Signed)
Patient returned call

## 2018-12-23 NOTE — Telephone Encounter (Signed)
Pt notified.  [See Labs, 12/18/18.]

## 2018-12-24 ENCOUNTER — Other Ambulatory Visit: Payer: Self-pay

## 2018-12-24 ENCOUNTER — Ambulatory Visit (INDEPENDENT_AMBULATORY_CARE_PROVIDER_SITE_OTHER): Payer: Medicare Other | Admitting: Cardiology

## 2018-12-24 ENCOUNTER — Encounter: Payer: Self-pay | Admitting: Cardiology

## 2018-12-24 VITALS — BP 136/84 | HR 76 | Ht 63.0 in | Wt 153.8 lb

## 2018-12-24 DIAGNOSIS — I4729 Other ventricular tachycardia: Secondary | ICD-10-CM

## 2018-12-24 DIAGNOSIS — R0609 Other forms of dyspnea: Secondary | ICD-10-CM

## 2018-12-24 DIAGNOSIS — I2510 Atherosclerotic heart disease of native coronary artery without angina pectoris: Secondary | ICD-10-CM | POA: Diagnosis not present

## 2018-12-24 DIAGNOSIS — I951 Orthostatic hypotension: Secondary | ICD-10-CM | POA: Diagnosis not present

## 2018-12-24 DIAGNOSIS — R0600 Dyspnea, unspecified: Secondary | ICD-10-CM | POA: Diagnosis not present

## 2018-12-24 DIAGNOSIS — I2583 Coronary atherosclerosis due to lipid rich plaque: Secondary | ICD-10-CM | POA: Diagnosis not present

## 2018-12-24 DIAGNOSIS — I10 Essential (primary) hypertension: Secondary | ICD-10-CM | POA: Diagnosis not present

## 2018-12-24 DIAGNOSIS — I472 Ventricular tachycardia: Secondary | ICD-10-CM

## 2018-12-24 DIAGNOSIS — R079 Chest pain, unspecified: Secondary | ICD-10-CM

## 2018-12-24 DIAGNOSIS — E7800 Pure hypercholesterolemia, unspecified: Secondary | ICD-10-CM
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
## 2018-12-24 NOTE — Patient Instructions (Addendum)
Medication Instructions:  Your physician recommends that you continue on your current medications as directed. Please refer to the Current Medication list given to you today.  If you need a refill on your cardiac medications before your next appointment, please call your pharmacy.   Lab work: None Ordered  If you have labs (blood work) drawn today and your tests are completely normal, you will receive your results only by: Marland Kitchen MyChart Message (if you have MyChart) OR . A paper copy in the mail If you have any lab test that is abnormal or we need to change your treatment, we will call you to review the results.  Testing/Procedures: Your physician has requested that you have a lexiscan myoview. For further information please visit [email or web address]. Please follow instruction sheet, as given.  Follow-Up: At [facility], you and your health needs are our priority.  As part of our continuing mission to provide you with exceptional heart care, we have created designated Provider Care Teams.  These Care Teams include your primary Cardiologist (physician) and Advanced Practice Providers (APPs -  Physician Assistants and Nurse Practitioners) who all work together to provide you with the care you need, when you need it. . You will need a follow up appointment in 1 year.  Please call our office 2 months in advance to schedule this appointment.  You may see Fransico Him, MD or one of the following Advanced Practice Providers on your designated Care Team:   . Lyda Jester, PA-C . Dayna Dunn, PA-C . Ermalinda Barrios, PA-C  Any Other Special Instructions Will Be Listed Below (If Applicable).

## 2018-12-28 ENCOUNTER — Telehealth (HOSPITAL_COMMUNITY): Payer: Self-pay

## 2018-12-28 NOTE — Telephone Encounter (Signed)
Instructions left for the patient on her answering machine. Asked to call back with any questions. S.Nachelle Negrette EMTP

## 2018-12-29 ENCOUNTER — Other Ambulatory Visit: Payer: Self-pay

## 2018-12-29 ENCOUNTER — Ambulatory Visit (HOSPITAL_COMMUNITY): Payer: Medicare Other | Attending: Cardiovascular Disease

## 2018-12-29 DIAGNOSIS — I2510 Atherosclerotic heart disease of native coronary artery without angina pectoris: Secondary | ICD-10-CM | POA: Diagnosis not present

## 2018-12-29 DIAGNOSIS — I2583 Coronary atherosclerosis due to lipid rich plaque: Secondary | ICD-10-CM | POA: Diagnosis not present

## 2018-12-29 DIAGNOSIS — R079 Chest pain, unspecified: Secondary | ICD-10-CM | POA: Diagnosis not present

## 2018-12-29 LAB — MYOCARDIAL PERFUSION IMAGING
LV dias vol: 43 mL (ref 46–106)
LV sys vol: 15 mL
Peak HR: 92 {beats}/min
Rest HR: 72 {beats}/min
SDS: 0
SRS: 2
SSS: 2
TID: 0.98

## 2018-12-29 MED ORDER — TECHNETIUM TC 99M TETROFOSMIN IV KIT
31.5000 | PACK | Freq: Once | INTRAVENOUS | Status: AC | PRN
  Administered 2018-12-29: 31.5 via INTRAVENOUS
  Filled 2018-12-29: qty 32

## 2018-12-29 MED ORDER — REGADENOSON 0.4 MG/5ML IV SOLN
0.4000 mg | Freq: Once | INTRAVENOUS | Status: AC
  Administered 2018-12-29: 0.4 mg via INTRAVENOUS

## 2018-12-29 MED ORDER — TECHNETIUM TC 99M TETROFOSMIN IV KIT
10.6000 | PACK | Freq: Once | INTRAVENOUS | Status: AC | PRN
  Administered 2018-12-29: 10.6 via INTRAVENOUS
  Filled 2018-12-29: qty 11

## 2018-12-30 ENCOUNTER — Telehealth: Payer: Self-pay

## 2018-12-30 NOTE — Telephone Encounter (Signed)
Notes recorded by Frederik Schmidt, RN on 12/30/2018 at 8:52 AM EDT  lpmtcb 10/21  ------

## 2018-12-30 NOTE — Telephone Encounter (Signed)
-----   Message from Sueanne Margarita, MD sent at 12/29/2018  5:36 PM EDT ----- Please find out if she is still having CP

## 2018-12-30 NOTE — Telephone Encounter (Signed)
lpmtcb 10/21

## 2018-12-30 NOTE — Telephone Encounter (Signed)
We could try Imdur 15mg  daily but would need to be careful with hx of orthostatic hypotension that could be exacerbated.  Please find out if she would like to try the long acting nitrate

## 2018-12-30 NOTE — Telephone Encounter (Signed)
Notes recorded by Frederik Schmidt, RN on 12/30/2018 at 12:55 PM EDT  The patient has been notified of the stress test result and verbalized understanding. All questions (if any) were answered.  Frederik Schmidt, RN 12/30/2018 12:55 PM   The patient states that she is still having CP, but it has decreased substantially and is nothing like it was.

## 2018-12-31 ENCOUNTER — Encounter: Payer: Self-pay | Admitting: Physician Assistant

## 2018-12-31 ENCOUNTER — Other Ambulatory Visit: Payer: Self-pay | Admitting: Family Medicine

## 2018-12-31 DIAGNOSIS — E785 Hyperlipidemia, unspecified: Secondary | ICD-10-CM

## 2018-12-31 MED ORDER — ISOSORBIDE MONONITRATE ER 30 MG PO TB24: 15.0000 mg | ORAL_TABLET | Freq: Every day | ORAL | 3 refills | Status: DC

## 2018-12-31 NOTE — Telephone Encounter (Signed)
error 

## 2018-12-31 NOTE — Telephone Encounter (Signed)
lpmtcb 10/22

## 2018-12-31 NOTE — Telephone Encounter (Signed)
Follow up ° ° °Patient is returning call. Please call. °

## 2018-12-31 NOTE — Telephone Encounter (Signed)
I spoke to the patient with Dr Theodosia Blender recommendation.  She will start Imdur 15 mg Daily and monitor outcome.

## 2018-12-31 NOTE — Addendum Note (Signed)
Addended by: Frederik Schmidt on: 12/31/2018 10:02 AM   Modules accepted: Orders

## 2018-12-31 NOTE — Telephone Encounter (Signed)
Gabapentin Last filled:  10/15/18, #450 Last OV:  12/11/18, AWV Next OV:  03/22/19, 3-4 mo f/u

## 2019-01-06 DIAGNOSIS — H35372 Puckering of macula, left eye: Secondary | ICD-10-CM | POA: Diagnosis not present

## 2019-01-06 DIAGNOSIS — Z961 Presence of intraocular lens: Secondary | ICD-10-CM | POA: Diagnosis not present

## 2019-01-06 DIAGNOSIS — E113493 Type 2 diabetes mellitus with severe nonproliferative diabetic retinopathy without macular edema, bilateral: Secondary | ICD-10-CM | POA: Diagnosis not present

## 2019-01-06 DIAGNOSIS — H401233 Low-tension glaucoma, bilateral, severe stage: Secondary | ICD-10-CM | POA: Diagnosis not present

## 2019-01-06 DIAGNOSIS — H26493 Other secondary cataract, bilateral: Secondary | ICD-10-CM | POA: Diagnosis not present

## 2019-01-18 ENCOUNTER — Other Ambulatory Visit: Payer: Self-pay | Admitting: Family Medicine

## 2019-01-18 DIAGNOSIS — Z1231 Encounter for screening mammogram for malignant neoplasm of breast: Secondary | ICD-10-CM

## 2019-01-21 ENCOUNTER — Encounter: Payer: Self-pay | Admitting: Family Medicine

## 2019-01-28 ENCOUNTER — Encounter: Payer: Self-pay | Admitting: Physician Assistant

## 2019-01-28 ENCOUNTER — Telehealth: Payer: Self-pay | Admitting: Family Medicine

## 2019-01-28 NOTE — Telephone Encounter (Signed)
plz call - received notice from GI that they have been unable to reach her.  plz have her call GI office at [phone] to schedule appt for abnormal stool test.

## 2019-01-28 NOTE — Telephone Encounter (Signed)
Appt scheduled with Maria Fox for 02/16/19 and patient was notified.

## 2019-01-28 NOTE — Telephone Encounter (Signed)
Pt returning call stating she call the GI office and was told she needed a referral.    Looks like the referral was closed.  Will discuss with [facility].

## 2019-01-28 NOTE — Telephone Encounter (Signed)
Left message on vm per dpr relaying Dr. G's message.  

## 2019-02-13 ENCOUNTER — Encounter: Payer: Self-pay | Admitting: Family Medicine

## 2019-02-16 ENCOUNTER — Ambulatory Visit (INDEPENDENT_AMBULATORY_CARE_PROVIDER_SITE_OTHER): Payer: Medicare Other | Admitting: Physician Assistant

## 2019-02-16 ENCOUNTER — Encounter: Payer: Self-pay | Admitting: Physician Assistant

## 2019-02-16 ENCOUNTER — Telehealth: Payer: Self-pay

## 2019-02-16 VITALS — BP 118/82 | HR 80 | Temp 97.1°F | Ht 62.5 in | Wt 154.0 lb

## 2019-02-16 DIAGNOSIS — R194 Change in bowel habit: Secondary | ICD-10-CM | POA: Diagnosis not present

## 2019-02-16 DIAGNOSIS — R110 Nausea: Secondary | ICD-10-CM

## 2019-02-16 DIAGNOSIS — Z1159 Encounter for screening for other viral diseases: Secondary | ICD-10-CM

## 2019-02-16 DIAGNOSIS — R195 Other fecal abnormalities: Secondary | ICD-10-CM | POA: Diagnosis not present

## 2019-02-16 MED ORDER — NA SULFATE-K SULFATE-MG SULF 17.5-3.13-1.6 GM/177ML PO SOLN: 1.0000 | Freq: Once | ORAL | 0 refills | Status: AC

## 2019-02-16 NOTE — Progress Notes (Signed)
Subjective:    Patient ID: Maria Fox, female    DOB: [birth year], 73 y.o.   MRN: [id number]  HPI Maria Fox is a pleasant 73 year old white female, new to GI today referred by Dr. [name] for evaluation of Hemoccult positive stool.  Patient has not had any prior GI evaluation. She does have several comorbidities including hypertension, congestive heart failure with normal EF 2019, coronary artery disease status post remote CABG and then drug-eluting stent September 2019, fatty liver, adult onset diabetes mellitus, chronic kidney disease stage III, prior CVA and history of depression. Patient is maintained on Plavix and aspirin and followed by Dr. Golden Hurter for cardiology. She had a physical done including labs towards the end of September 2020 and stool for occult blood at that time was positive.  Hemoglobin was 12.2 hematocrit of 37.5 MCV of 93.9. Patient herself has not noted any melena or hematochezia.  However she has noticed a change in her bowel habits with intermittent loose stools and some urgency postprandially over the past 3 or 4 months.  She is usually having 1-2 bowel movements per day.  She is also developed some mild mid abdominal discomfort/cramping which is been intermittent.  Appetite has been fair, weight is stable.  She has had some occasional nausea over past 3 to 4 months but no vomiting.,  No dysphagia or odynophagia. She has been on pantoprazole 20 mg p.o. every morning.  She has a prescription for metoclopramide to use as needed for nausea but has not required that.  She thinks her 2 newest meds are Imdur and metoprolol which were started several months ago. Family history by chart pertinent for colon cancer in her mother, father with throat cancer.  Review of Systems Pertinent positive and negative review of systems were noted in the above HPI section.  All other review of systems was otherwise negative.  Outpatient Encounter Medications as of 02/16/2019  Medication Sig   . acetaminophen (TYLENOL) 500 MG tablet Take 500 mg by mouth as needed.  Marland Kitchen albuterol (PROAIR HFA) 108 (90 Base) MCG/ACT inhaler Inhale 2 puffs into the lungs every 6 (six) hours as needed for wheezing or shortness of breath.  Marland Kitchen aspirin EC 81 MG tablet Take 81 mg by mouth daily.   Marland Kitchen atorvastatin (LIPITOR) 40 MG tablet TAKE 1 TABLET BY MOUTH EVERYDAY AT BEDTIME  . Blood Glucose Monitoring Suppl (ONE TOUCH ULTRA 2) w/Device KIT Use as instructed to check blood sugar daily.  . clopidogrel (PLAVIX) 75 MG tablet Take 1 tablet (75 mg total) by mouth daily.  . Cyanocobalamin (B-12) 1000 MCG SUBL Place 1 tablet under the tongue daily.  Marland Kitchen docusate sodium (COLACE) 100 MG capsule Take 1 capsule (100 mg total) by mouth daily. (Patient taking differently: Take 100 mg by mouth daily as needed. )  . furosemide (LASIX) 20 MG tablet TAKE 1 TABLET BY MOUTH EVERY DAY  . gabapentin (NEURONTIN) 100 MG capsule TAKE 1 CAPSULE BY MOUTH EVERY MORNING TAKE 1 CAPSULE IN AFTERNOON AND 3 CAPSULES IN THE EVENING  . glimepiride (AMARYL) 2 MG tablet Take 2 mg by mouth daily with breakfast.  . glucose blood (ONETOUCH ULTRA) test strip Use as instructed to check blood sugar daily  . isosorbide mononitrate (IMDUR) 30 MG 24 hr tablet Take 0.5 tablets (15 mg total) by mouth daily.  Marland Kitchen latanoprost (XALATAN) 0.005 % ophthalmic solution   . metFORMIN (GLUCOPHAGE) 500 MG tablet Take 1 tablet (500 mg total) by mouth 3 (three) times daily before  meals.  . metoCLOPramide (REGLAN) 10 MG tablet Take 1 tablet (10 mg total) by mouth every 6 (six) hours as needed for nausea or vomiting.  . metoprolol tartrate (LOPRESSOR) 25 MG tablet TAKE 1/2 TABLET BY MOUTH DAILY  . NON FORMULARY Antifungal cream from [place] as needed  . OneTouch Delica Lancets 53G MISC Use as directed to check blood sugar daily  . pantoprazole (PROTONIX) 20 MG tablet Take 1 tablet (20 mg total) by mouth daily.  . traZODone (DESYREL) 50 MG tablet Take 1.5 tablets  (75 mg total) by mouth at bedtime as needed for sleep.   No facility-administered encounter medications on file as of 02/16/2019.    Allergies  Allergen Reactions  . Mushroom Extract Complex Anaphylaxis  . Codeine Nausea And Vomiting  . Penicillins Hives and Swelling    Has patient had a PCN reaction causing immediate rash, facial/tongue/throat swelling, SOB or lightheadedness with hypotension: Yes Has patient had a PCN reaction causing severe rash involving mucus membranes or skin necrosis: Yes Has patient had a PCN reaction that required hospitalization Yes Has patient had a PCN reaction occurring within the last 10 years: No If all of the above answers are "NO", then may proceed with Cephalosporin use.   . Sulfa Antibiotics Nausea And Vomiting  . Bee Venom     Throat swelling  . Erythromycin Base Rash   Patient Active Problem List   Diagnosis Date Noted  . Health maintenance examination 12/11/2018  . Type 2 diabetes mellitus with severe nonproliferative diabetic retinopathy with macular edema, bilateral ([place]) 07/16/2018  . Coronary artery disease due to lipid rich plaque   . Effort angina ([place]) 11/12/2017  . Abnormal stress ECG   . Dyspnea on exertion 11/04/2017  . GERD (gastroesophageal reflux disease) 08/15/2017  . Trigger finger 06/09/2017  . Nocturnal leg movements 03/25/2017  . Osteopenia 02/01/2017  . Advanced care planning/counseling discussion 01/02/2017  . Stage 3 chronic kidney disease due to type 2 diabetes mellitus ([place]) 08/26/2016  . Insomnia 08/26/2016  . Vitamin B12 deficiency 11/10/2015  . General weakness 08/04/2015  . Epistaxis 07/07/2015  . Fatty liver disease, nonalcoholic 99/24/2683  . Chronic diastolic CHF (congestive heart failure) ([place]) 08/27/2014  . Positive hepatitis C antibody test 08/27/2014  . Iron deficiency 08/27/2014  . NSVT (nonsustained ventricular tachycardia) ([place])   . Nausea without vomiting   . Autonomic orthostatic hypotension  07/11/2014  . S/P CABG x 3 07/08/2014  . History of cerebrovascular accident (CVA) in adulthood 07/08/2014  . DM (diabetes mellitus), type 2, uncontrolled w/neurologic complication ([place]) 41/96/2229  . Itching 03/16/2012  . Rash 03/16/2012  . Medicare annual wellness visit, initial 10/28/2011  . Hyperlipidemia associated with type 2 diabetes mellitus ([place])   . Idiopathic angioedema 06/23/2011  . Vitiligo 06/23/2011  . Dermatitis 06/23/2011  . MDD (major depressive disorder), recurrent severe, without psychosis ([place]) 05/08/2006  . Hypertension, essential 05/08/2006  . MENOPAUSAL SYNDROME 05/08/2006   Social History   Socioeconomic History  . Marital status: Single    Spouse name: Not on file  . Number of children: 0  . Years of education: Not on file  . Highest education level: Not on file  Occupational History  . Occupation: Radio broadcast assistant: [employer]  . Occupation: retired  Scientific laboratory technician  . Financial resource strain: Not hard at all  . Food insecurity    Worry: Never true    Inability: Never true  . Transportation  needs    Medical: No    Non-medical: No  Tobacco Use  . Smoking status: Never Smoker  . Smokeless tobacco: Never Used  Substance and Sexual Activity  . Alcohol use: No  . Drug use: No  . Sexual activity: Not on file  Lifestyle  . Physical activity    Days per week: 4 days    Minutes per session: 10 min  . Stress: Not at all  Relationships  . Social Herbalist on phone: Not on file    Gets together: Not on file    Attends religious service: Not on file    Active member of club or organization: Not on file    Attends meetings of clubs or organizations: Not on file    Relationship status: Not on file  . Intimate partner violence    Fear of current or ex partner: No    Emotionally abused: No    Physically abused: No    Forced sexual activity: No  Other Topics Concern  . Not on file  Social History Narrative    Caffeine: occasional   Lives with friend, Maria Fox, 1 cat   Occupation: Web designer, and mailer at [employer]   Edu: HS   Activity: walks daily (2-3 blocks)   Diet: good water, daily fruits/vegetables      [facility] unable to reach patient to establish care (04/2015)    Maria Fox family history includes Asthma in her brother; Cervical cancer in her maternal grandmother; Colon cancer in her mother; Diabetes type II in her mother; Hyperlipidemia in her brother and mother; Hypertension in her brother and mother; Throat cancer in her father.      Objective:    Vitals:   02/16/19 1006  BP: 118/82  Pulse: 80  Temp: (!) 97.1 F (36.2 C)    Physical Exam;Well-developed well-nourished elderly white female in no acute distress.  Pleasant  Weight, 154 BMI 27.7  HEENT; nontraumatic normocephalic, EOMI, PER R LA, sclera anicteric. Oropharynx; not examined/mask/Covid Neck; supple, no JVD Cardiovascular; regular rate and rhythm with S1-S2, no murmur rub or gallop, sternal incisional scar Pulmonary; Clear bilaterally Abdomen; soft, very mildly tender mid abdomen no guarding,, nondistended, no palpable mass or hepatosplenomegaly, bowel sounds are active Rectal; not done today recently documented Hemoccult positive skin; benign exam, no jaundice rash or appreciable lesions Extremities; no clubbing cyanosis or edema skin warm and dry Neuro/Psych; alert and oriented x4, grossly nonfocal mood and affect appropriate       Assessment & Plan:   #57 73 year old white female with Hemoccult positive stool in setting of aspirin and Plavix.  No prior GI evaluation. Patient has had symptoms over the past 3 to 4 months with some postprandial urgency for bowel movements and intermittent loose stools as well as intermittent mid abdominal cramping/pain and nausea. Etiology of symptoms not clear, rule out colonic neoplasm, mild colitis, peptic ulcer disease, chronic gastropathy,  #2 adult  onset diabetes mellitus 3.  Chronic kidney disease stage III 4.  Coronary artery disease status post CABG x3 remote and drug-eluting stent September 2019 5.  Hypertension 6.  History of depression  Plan; Patient will be scheduled for colonoscopy and upper endoscopy with Dr. Loletha Carrow.  Both procedures were discussed in detail with the patient including indications risks and benefits and she is agreeable to proceed. She will need to hold Plavix for 5 days prior to the procedures and can continue baby aspirin.  We will communicate  with her cardiologist Dr. Golden Hurter to assure this is reasonable for this patient. Continue pantoprazole 20 mg p.o. every morning. Further recommendations pending findings at endoscopic evaluation.  Lindsie Simar Genia Harold PA-C 02/16/2019   Cc: [name], MD

## 2019-02-16 NOTE — Telephone Encounter (Signed)
[facility] Pre-operative Risk Assessment     Request for surgical clearance:     Endoscopy Procedure  What type of surgery is being performed?     EGD and colonoscopy  When is this surgery scheduled?     04/08/2019  What type of clearance is required ?   Pharmacy  Are there any medications that need to be held prior to surgery and how long? Plavix, 5 days  Practice name and name of physician performing surgery?      [facility]  What is your office phone and fax number?      Phone- [phone]  [phone]  Anesthesia type (None, local, MAC, general) ?       MAC

## 2019-02-16 NOTE — Telephone Encounter (Signed)
All antiplatelet /P2Y12 clearance by Cardiologist

## 2019-02-16 NOTE — Patient Instructions (Signed)
You have been scheduled for an endoscopy and colonoscopy. Please follow the written instructions given to you at your visit today. Please pick up your prep supplies at the pharmacy within the next 1-3 days. If you use inhalers (even only as needed), please bring them with you on the day of your procedure.   You will be contacted by our office prior to your procedure for directions on holding your Plavix.  If you do not hear from our office 1 week prior to your scheduled procedure, please call [phone] to discuss.    Continue your pantoprazole 20mg  daily 30-60 minutes before breakfast.   I appreciate the opportunity to care for you. Amy Esterwood, PA-C

## 2019-02-17 NOTE — Progress Notes (Signed)
____________________________________________________________  Attending physician addendum:  Thank you for sending this case to me. I have reviewed the entire note, and the outlined plan seems appropriate.  Shandelle Borrelli Danis, MD  ____________________________________________________________  

## 2019-02-17 NOTE — Telephone Encounter (Signed)
   Primary Cardiologist: Fransico Him, MD  Chart reviewed as part of pre-operative protocol coverage. Given past medical history and time since last visit, based on ACC/AHA guidelines, MYLA RIEB would be at acceptable risk for the planned procedure without further cardiovascular testing.   OK to hold Plavix 5 days pre op if needed.  I will route this recommendation to the requesting party via Epic fax function and remove from pre-op pool.  Please call with questions.  Kerin Ransom, PA-C 02/17/2019, 1:10 PM

## 2019-02-17 NOTE — Telephone Encounter (Signed)
Yes ok to hold plavix for colonoscopy

## 2019-02-22 ENCOUNTER — Telehealth: Payer: Self-pay
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
## 2019-02-22 NOTE — Telephone Encounter (Signed)
Maria Fox called in I informed her about holding her Plavix 5 days prior to her upcoming procedure. She verbalized understanding.

## 2019-03-02 ENCOUNTER — Other Ambulatory Visit: Payer: Self-pay

## 2019-03-02 ENCOUNTER — Ambulatory Visit (INDEPENDENT_AMBULATORY_CARE_PROVIDER_SITE_OTHER): Payer: Medicare Other | Admitting: Podiatry

## 2019-03-02 DIAGNOSIS — M79675 Pain in left toe(s): Secondary | ICD-10-CM

## 2019-03-02 DIAGNOSIS — E0865 Diabetes mellitus due to underlying condition with hyperglycemia: Secondary | ICD-10-CM | POA: Diagnosis not present

## 2019-03-02 DIAGNOSIS — B351 Tinea unguium: Secondary | ICD-10-CM

## 2019-03-02 DIAGNOSIS — M79674 Pain in right toe(s): Secondary | ICD-10-CM | POA: Diagnosis not present

## 2019-03-02 NOTE — Patient Instructions (Addendum)
DISCONTINUE [facility] NAIL TREATMENT   Diabetes Mellitus and Foot Care Foot care is an important part of your health, especially when you have diabetes. Diabetes may cause you to have problems because of poor blood flow (circulation) to your feet and legs, which can cause your skin to:  Become thinner and drier.  Break more easily.  Heal more slowly.  Peel and crack. You may also have nerve damage (neuropathy) in your legs and feet, causing decreased feeling in them. This means that you may not notice minor injuries to your feet that could lead to more serious problems. Noticing and addressing any potential problems early is the best way to prevent future foot problems. How to care for your feet Foot hygiene  Wash your feet daily with warm water and mild soap. Do not use hot water. Then, pat your feet and the areas between your toes until they are completely dry. Do not soak your feet as this can dry your skin.  Trim your toenails straight across. Do not dig under them or around the cuticle. File the edges of your nails with an emery board or nail file.  Apply a moisturizing lotion or petroleum jelly to the skin on your feet and to dry, brittle toenails. Use lotion that does not contain alcohol and is unscented. Do not apply lotion between your toes. Shoes and socks  Wear clean socks or stockings every day. Make sure they are not too tight. Do not wear knee-high stockings since they may decrease blood flow to your legs.  Wear shoes that fit properly and have enough cushioning. Always look in your shoes before you put them on to be sure there are no objects inside.  To break in new shoes, wear them for just a few hours a day. This prevents injuries on your feet. Wounds, scrapes, corns, and calluses  Check your feet daily for blisters, cuts, bruises, sores, and redness. If you cannot see the bottom of your feet, use a mirror or ask someone for help.  Do not cut corns or  calluses or try to remove them with medicine.  If you find a minor scrape, cut, or break in the skin on your feet, keep it and the skin around it clean and dry. You may clean these areas with mild soap and water. Do not clean the area with peroxide, alcohol, or iodine.  If you have a wound, scrape, corn, or callus on your foot, look at it several times a day to make sure it is healing and not infected. Check for: ? Redness, swelling, or pain. ? Fluid or blood. ? Warmth. ? Pus or a bad smell. General instructions  Do not cross your legs. This may decrease blood flow to your feet.  Do not use heating pads or hot water bottles on your feet. They may burn your skin. If you have lost feeling in your feet or legs, you may not know this is happening until it is too late.  Protect your feet from hot and cold by wearing shoes, such as at the beach or on hot pavement.  Schedule a complete foot exam at least once a year (annually) or more often if you have foot problems. If you have foot problems, report any cuts, sores, or bruises to your health care provider immediately. Contact a health care provider if:  You have a medical condition that increases your risk of infection and you have any cuts, sores, or bruises on your feet.  You have an injury that is not healing.  You have redness on your legs or feet.  You feel burning or tingling in your legs or feet.  You have pain or cramps in your legs and feet.  Your legs or feet are numb.  Your feet always feel cold.  You have pain around a toenail. Get help right away if:  You have a wound, scrape, corn, or callus on your foot and: ? You have pain, swelling, or redness that gets worse. ? You have fluid or blood coming from the wound, scrape, corn, or callus. ? Your wound, scrape, corn, or callus feels warm to the touch. ? You have pus or a bad smell coming from the wound, scrape, corn, or callus. ? You have a fever. ? You have a red line  going up your leg. Summary  Check your feet every day for cuts, sores, red spots, swelling, and blisters.  Moisturize feet and legs daily.  Wear shoes that fit properly and have enough cushioning.  If you have foot problems, report any cuts, sores, or bruises to your health care provider immediately.  Schedule a complete foot exam at least once a year (annually) or more often if you have foot problems. This information is not intended to replace advice given to you by your health care provider. Make sure you discuss any questions you have with your health care provider. Document Released: 02/23/2000 Document Revised: 04/09/2017 Document Reviewed: 03/29/2016 Elsevier Patient Education  2020 [employer].

## 2019-03-08 ENCOUNTER — Encounter: Payer: Self-pay | Admitting: Podiatry

## 2019-03-08 NOTE — Progress Notes (Signed)
Subjective: Maria Fox is a 73 y.o. y.o. female with h/o diabetes who presents today for preventative diabetic foot care. Patient has painful, elongated mycotic toenails which interfere with daily activities. Pain is aggravated when wearing enclosed shoe gear and relieved with periodic professional debridement.  She states she has been using topical antifungal from [place] on her nails and think they look very good.  Maria Bush, MD is patient's PCP.   Medications reviewed in chart.  Allergies  Allergen Reactions  . Mushroom Extract Complex Anaphylaxis  . Codeine Nausea And Vomiting  . Penicillins Hives and Swelling    Has patient had a PCN reaction causing immediate rash, facial/tongue/throat swelling, SOB or lightheadedness with hypotension: Yes Has patient had a PCN reaction causing severe rash involving mucus membranes or skin necrosis: Yes Has patient had a PCN reaction that required hospitalization Yes Has patient had a PCN reaction occurring within the last 10 years: No If all of the above answers are "NO", then may proceed with Cephalosporin use.   . Sulfa Antibiotics Nausea And Vomiting  . Bee Venom     Throat swelling  . Erythromycin Base Rash   Objective: There were no vitals filed for this visit.  Vascular Examination: Capillary refill time <3 seconds  b/l.  Dorsalis pedis and posterior tibial pulses palpable b/l.  Digital hair sparse b/l.  Skin temperature gradient WNL b/l.  Dermatological Examination: Skin with normal turgor, texture and tone b/l.  Toenails 2-5 b/l discolored, thick, dystrophic with subungual debris and pain with palpation to nailbeds due to thickness of nails. She does have clearing improvement in nails at proximal 1/3 of nailplates  Anonychia b/l great toes.   Depigmentation noted different areas of b/l feet consistent with vitiligo.  Musculoskeletal: Muscle strength 5/5 to all LE muscle groups  b/l.  Neurological: Sensation intact 5/5 b/l with 10 gram monofilament.  Vibratory sensation intact  b/l.  Last A1c:  Hemoglobin A1C Latest Ref Rng & Units 12/04/2018 07/16/2018  HGBA1C 4.6 - 6.5 % 9.0(H) 8.8(H)  Some recent data might be hidden   Assessment: 1. Painful onychomycosis toenails 2-5 b/l 2.   NIDDM  Plan: 1. Continue diabetic foot care principles. Literature dispensed on today. 2. Toenails 2-5 b/l were debrided in length and girth without iatrogenic bleeding. Discontinue topical antifungal from [place]. 3. Patient to continue soft, supportive shoe gear daily. 4. Patient to report any pedal injuries to medical professional immediately. 5. Follow up 4 months per patient request.  6. Patient/POA to call should there be a concern in the interim.

## 2019-03-11 ENCOUNTER — Other Ambulatory Visit: Payer: Self-pay

## 2019-03-11 ENCOUNTER — Ambulatory Visit
Admission: RE | Admit: 2019-03-11 | Discharge: 2019-03-11 | Disposition: A | Discharge status: Home / Self Care | Payer: Medicare Other | Source: Ambulatory Visit | Attending: Family Medicine | Admitting: Family Medicine

## 2019-03-11 DIAGNOSIS — Z1231 Encounter for screening mammogram for malignant neoplasm of breast: Secondary | ICD-10-CM

## 2019-03-12 ENCOUNTER — Emergency Department (HOSPITAL_COMMUNITY)
Admission: EM | Admit: 2019-03-12 | Discharge: 2019-03-12 | Disposition: A | Discharge status: Home / Self Care | Payer: Medicare HMO | Attending: Emergency Medicine | Admitting: Emergency Medicine

## 2019-03-12 ENCOUNTER — Other Ambulatory Visit: Payer: Self-pay

## 2019-03-12 ENCOUNTER — Encounter (HOSPITAL_COMMUNITY): Payer: Self-pay | Admitting: Emergency Medicine

## 2019-03-12 ENCOUNTER — Other Ambulatory Visit: Payer: Self-pay | Admitting: Family Medicine

## 2019-03-12 DIAGNOSIS — E119 Type 2 diabetes mellitus without complications: Secondary | ICD-10-CM | POA: Insufficient documentation

## 2019-03-12 DIAGNOSIS — Z79899 Other long term (current) drug therapy: Secondary | ICD-10-CM | POA: Insufficient documentation

## 2019-03-12 DIAGNOSIS — I110 Hypertensive heart disease with heart failure: Secondary | ICD-10-CM | POA: Insufficient documentation

## 2019-03-12 DIAGNOSIS — I5030 Unspecified diastolic (congestive) heart failure: Secondary | ICD-10-CM | POA: Diagnosis not present

## 2019-03-12 DIAGNOSIS — I2510 Atherosclerotic heart disease of native coronary artery without angina pectoris: Secondary | ICD-10-CM | POA: Diagnosis not present

## 2019-03-12 DIAGNOSIS — Z7984 Long term (current) use of oral hypoglycemic drugs: Secondary | ICD-10-CM | POA: Diagnosis not present

## 2019-03-12 DIAGNOSIS — N644 Mastodynia: Secondary | ICD-10-CM | POA: Diagnosis present

## 2019-03-12 DIAGNOSIS — N61 Mastitis without abscess: Secondary | ICD-10-CM | POA: Insufficient documentation

## 2019-03-12 DIAGNOSIS — Z7982 Long term (current) use of aspirin: Secondary | ICD-10-CM | POA: Diagnosis not present

## 2019-03-12 DIAGNOSIS — E1149 Type 2 diabetes mellitus with other diabetic neurological complication: Secondary | ICD-10-CM

## 2019-03-12 DIAGNOSIS — IMO0002 Reserved for concepts with insufficient information to code with codable children: Secondary | ICD-10-CM

## 2019-03-12 HISTORY — PX: ESOPHAGOGASTRODUODENOSCOPY: SHX1529

## 2019-03-12 HISTORY — PX: COLONOSCOPY: SHX174

## 2019-03-12 MED ORDER — DOXYCYCLINE HYCLATE 100 MG PO TABS
100.0000 mg | ORAL_TABLET | Freq: Once | ORAL | Status: AC
  Administered 2019-03-12: 100 mg via ORAL
  Filled 2019-03-12: qty 1

## 2019-03-12 MED ORDER — DOXYCYCLINE HYCLATE 100 MG PO CAPS: 100.0000 mg | ORAL_CAPSULE | Freq: Two times a day (BID) | ORAL | 0 refills | Status: DC
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
## 2019-03-12 NOTE — ED Triage Notes (Signed)
Pt presents for evaluation of lump on right breast. Pt states that right breast was swollen and hot to touch. Pt states that she was unable to get a Mammogram yesterday due to needing diagnostic testing first. Pt states she was unable to see PCP yesterday.

## 2019-03-12 NOTE — ED Notes (Signed)
Pt requesting MD to take another look at breast while standing, she states it is more painful and large when standing. When trying to palpate pt could not allow me to touch without jumping. MD made aware.

## 2019-03-15 ENCOUNTER — Telehealth: Payer: Self-pay

## 2019-03-15 ENCOUNTER — Encounter: Payer: Self-pay | Admitting: Family Medicine

## 2019-03-15 ENCOUNTER — Other Ambulatory Visit: Payer: Self-pay

## 2019-03-15 ENCOUNTER — Ambulatory Visit (INDEPENDENT_AMBULATORY_CARE_PROVIDER_SITE_OTHER): Payer: Medicare HMO | Admitting: Family Medicine

## 2019-03-15 VITALS — BP 140/72 | HR 88 | Temp 96.4°F | Ht 63.0 in | Wt 154.6 lb

## 2019-03-15 DIAGNOSIS — E1149 Type 2 diabetes mellitus with other diabetic neurological complication: Secondary | ICD-10-CM | POA: Diagnosis not present

## 2019-03-15 DIAGNOSIS — E1165 Type 2 diabetes mellitus with hyperglycemia: Secondary | ICD-10-CM

## 2019-03-15 DIAGNOSIS — N644 Mastodynia: Secondary | ICD-10-CM | POA: Diagnosis not present

## 2019-03-15 DIAGNOSIS — IMO0002 Reserved for concepts with insufficient information to code with codable children: Secondary | ICD-10-CM

## 2019-03-15 LAB — POCT GLYCOSYLATED HEMOGLOBIN (HGB A1C): Hemoglobin A1C: 11 % — AB (ref 4.0–5.6)

## 2019-03-15 MED ORDER — DOXYCYCLINE HYCLATE 100 MG PO CAPS: 100.0000 mg | ORAL_CAPSULE | Freq: Two times a day (BID) | ORAL | 0 refills | Status: DC

## 2019-03-15 NOTE — Telephone Encounter (Signed)
Copied from [place] [id number]. Topic: Appointment Scheduling - Scheduling Inquiry for Clinic >> Mar 11, 2019  3:09 PM Alease Frame wrote: Reason for CRM: patient is wanting to sch an appt for Dr [name]. Please advise

## 2019-03-15 NOTE — Assessment & Plan Note (Addendum)
Worsening control despite compliance with current regimen (glipizide 5mg  bid, metformin 500mg  TID). Will review SGLT2 or GLP1 RA at next visit. Advised return in 2 wks for DM f/u visit. Affordability of medications remains a concern.

## 2019-03-15 NOTE — Progress Notes (Signed)
This visit was conducted in person.  BP 140/72   Pulse 88   Temp (!) 96.4 F (35.8 C) (Skin)   Ht '5\' 3"'  (1.6 m)   Wt 154 lb 9.6 oz (70.1 kg)   SpO2 94%   BMI 27.39 kg/m    CC: check breast  Subjective:    Patient ID: Maria Fox, female    DOB: 1945/05/30, 74 y.o.   MRN: [id number]  HPI: Maria Fox is a 74 y.o. female presenting on 03/15/2019 for Breast Problem   Seen at [facility] ER 03/12/2019, note reviewed. Tender, red ,swollen R breast thought to be resolving mastitis treated with doxycycline - she did not take this.   Endorses 2 wk h/o redness, swelling and pain to medial R breast. Very tender to touch. Hasn't tried anything for this yet. She does take tylenol PRN.   Last mammo 01/2017 - diagnostic L mammo/US - dense fibroglandular tissue. rec screening mammo rpt 1 yr. Planned mammogram was postponed due to this.      Relevant past medical, surgical, family and social history reviewed and updated as indicated. Interim medical history since our last visit reviewed. Allergies and medications reviewed and updated. Outpatient Medications Prior to Visit  Medication Sig Dispense Refill  . acetaminophen (TYLENOL) 500 MG tablet Take 500 mg by mouth as needed.    Marland Kitchen albuterol (PROAIR HFA) 108 (90 Base) MCG/ACT inhaler Inhale 2 puffs into the lungs every 6 (six) hours as needed for wheezing or shortness of breath. 1 Inhaler 1  . aspirin EC 81 MG tablet Take 81 mg by mouth daily.     Marland Kitchen atorvastatin (LIPITOR) 40 MG tablet TAKE 1 TABLET BY MOUTH EVERYDAY AT BEDTIME 90 tablet 3  . Blood Glucose Monitoring Suppl (ONE TOUCH ULTRA 2) w/Device KIT Use as instructed to check blood sugar daily. 1 kit 0  . clopidogrel (PLAVIX) 75 MG tablet Take 1 tablet (75 mg total) by mouth daily. 90 tablet 1  . Cyanocobalamin (B-12) 1000 MCG SUBL Place 1 tablet under the tongue daily. 30 each   . docusate sodium (COLACE) 100 MG capsule Take 1 capsule (100 mg total) by mouth daily. (Patient taking differently: Take  100 mg by mouth daily as needed. ) 30 capsule 6  . furosemide (LASIX) 20 MG tablet TAKE 1 TABLET BY MOUTH EVERY DAY 90 tablet 3  . gabapentin (NEURONTIN) 100 MG capsule TAKE 1 CAPSULE BY MOUTH EVERY MORNING TAKE 1 CAPSULE IN AFTERNOON AND 3 CAPSULES IN THE EVENING 450 capsule 0  . glipiZIDE (GLUCOTROL) 5 MG tablet Take 5 mg by mouth 2 (two) times daily.    . isosorbide mononitrate (IMDUR) 30 MG 24 hr tablet Take 0.5 tablets (15 mg total) by mouth daily. 90 tablet 3  . latanoprost (XALATAN) 0.005 % ophthalmic solution     . metFORMIN (GLUCOPHAGE) 500 MG tablet Take 1 tablet (500 mg total) by mouth 3 (three) times daily before meals. 270 tablet 1  . metoCLOPramide (REGLAN) 10 MG tablet Take 1 tablet (10 mg total) by mouth every 6 (six) hours as needed for nausea or vomiting. 120 tablet 3  . metoprolol tartrate (LOPRESSOR) 25 MG tablet TAKE 1/2 TABLET BY MOUTH DAILY 45 tablet 3  . NON FORMULARY Antifungal cream from [place] as needed    . OneTouch Delica Lancets 85U MISC Use as directed to check blood sugar daily 100 each 0  . ONETOUCH ULTRA test strip USE AS INSTRUCTED TO CHECK BLOOD SUGAR DAILY  100 strip 3  . pantoprazole (PROTONIX) 20 MG tablet Take 1 tablet (20 mg total) by mouth daily. 90 tablet 0  . traZODone (DESYREL) 50 MG tablet Take 1.5 tablets (75 mg total) by mouth at bedtime as needed for sleep. 135 tablet 1  . doxycycline (VIBRAMYCIN) 100 MG capsule Take 1 capsule (100 mg total) by mouth 2 (two) times daily. One po bid x 7 days 14 capsule 0  . glimepiride (AMARYL) 2 MG tablet Take 2 mg by mouth daily with breakfast.     No facility-administered medications prior to visit.     Per HPI unless specifically indicated in ROS section below Review of Systems Objective:    BP 140/72   Pulse 88   Temp (!) 96.4 F (35.8 C) (Skin)   Ht '5\' 3"'  (1.6 m)   Wt 154 lb 9.6 oz (70.1 kg)   SpO2 94%   BMI 27.39 kg/m   Wt Readings from Last 3 Encounters:  03/15/19 154 lb 9.6 oz  (70.1 kg)  03/12/19 148 lb 9.6 oz (67.4 kg)  02/16/19 154 lb (69.9 kg)    Physical Exam Vitals and nursing note reviewed.  Constitutional:      Appearance: Normal appearance.  Chest:     Chest wall: Tenderness present.     Breasts:        Right: Tenderness present. No swelling, bleeding, inverted nipple, mass, nipple discharge or skin change.        Left: Normal. No swelling, bleeding, inverted nipple, mass, nipple discharge, skin change or tenderness.       Comments: Area of tenderness without evident erythema, warmth, skin changes, palpable mass, or lymphadenopathy Lymphadenopathy:     Upper Body:     Right upper body: No supraclavicular, axillary or pectoral adenopathy.     Left upper body: No supraclavicular, axillary or pectoral adenopathy.  Neurological:     Mental Status: She is alert.       Lab Results  Component Value Date   HGBA1C 11.0 (A) 03/15/2019    Assessment & Plan:  This visit occurred during the SARS-CoV-2 public health emergency.  Safety protocols were in place, including screening questions prior to the visit, additional usage of staff PPE, and extensive cleaning of exam room while observing appropriate contact time as indicated for disinfecting solutions.   Problem List Items Addressed This Visit    Pain of right breast - Primary    Exam today reassuringly normal except for tender area at medial R breast without underlying abnormality on breast exam. ?radiculitis vs chest wall strain. Agree with filling doxycycline course for possible resolving mastitis. Will also recommend more regular tylenol use and warm compresses to help speed recovery. Do recommend proceeding with diagnostic mammo/US in place of regular screening mammo. Pt agrees with plan.       Relevant Orders   US BREAST LTD UNI RIGHT INC AXILLA   MM DIAG BREAST TOMO BILATERAL   DM (diabetes mellitus), type 2, uncontrolled w/neurologic complication ([name])    Worsening control despite compliance  with current regimen (glipizide 32m bid, metformin 5066mTID). Will review SGLT2 or GLP1 RA at next visit. Advised return in 2 wks for DM f/u visit. Affordability of medications remains a concern.      Relevant Orders   HgB A1c  POCT (Completed)       Meds ordered this encounter  Medications  . doxycycline (VIBRAMYCIN) 100 MG capsule    Sig: Take 1 capsule (100 mg total)  by mouth 2 (two) times daily.    Dispense:  14 capsule    Refill:  0   Orders Placed This Encounter  Procedures  . US BREAST LTD UNI RIGHT INC AXILLA    Standing Status:   Future    Standing Expiration Date:   05/12/2020    Order Specific Question:   Reason for Exam (SYMPTOM  OR DIAGNOSIS REQUIRED)    Answer:   R medial breast pain    Order Specific Question:   Preferred imaging location?    Answer:   [facility]  . MM DIAG BREAST TOMO BILATERAL    Standing Status:   Future    Standing Expiration Date:   05/12/2020    Order Specific Question:   Reason for Exam (SYMPTOM  OR DIAGNOSIS REQUIRED)    Answer:   R medial breast pain    Order Specific Question:   Preferred imaging location?    Answer:   [facility]  . HgB A1c  POCT    Patient Instructions  You may have strain of chest wall or nerve irritation of right breast. Skin overall looking ok, possible resolving skin infection.  Go ahead and take antibiotic sent to pharmacy (doxycycline) for 1 week course.  Continue tylenol, use warm compresses to skin three times a day. Update [place] with effect. I will order more detailed mammogram as well.  Return in a few weeks for diabetes check.    Follow up plan: Return in about 2 weeks (around 03/29/2019) for follow up visit.  [name], MD

## 2019-03-15 NOTE — Telephone Encounter (Signed)
Left a voicemail for patient to call the office. Patient is requesting an appointment with Dr. [name]. However, patient is already scheduled for 1/11. Need to confirm if patient is wanting to be sooner.

## 2019-03-15 NOTE — Assessment & Plan Note (Signed)
Exam today reassuringly normal except for tender area at medial R breast without underlying abnormality on breast exam. ?radiculitis vs chest wall strain. Agree with filling doxycycline course for possible resolving mastitis. Will also recommend more regular tylenol use and warm compresses to help speed recovery. Do recommend proceeding with diagnostic mammo/US in place of regular screening mammo. Pt agrees with plan.

## 2019-03-15 NOTE — Patient Instructions (Addendum)
You may have strain of chest wall or nerve irritation of right breast. Skin overall looking ok, possible resolving skin infection.  Go ahead and take antibiotic sent to pharmacy (doxycycline) for 1 week course.  Continue tylenol, use warm compresses to skin three times a day. Update [place] with effect. I will order more detailed mammogram as well.  Return in a few weeks for diabetes check.

## 2019-03-16 ENCOUNTER — Telehealth: Payer: Self-pay

## 2019-03-16 NOTE — Telephone Encounter (Signed)
Yes- recommend scheduling mammo after finishing antibiotic. Thanks.

## 2019-03-16 NOTE — Telephone Encounter (Signed)
Pt started antibx rx'd@1 -4-21 OV.  Pt ?s when should she have dx mammo/us?  After finishing antibx or other?

## 2019-03-22 ENCOUNTER — Ambulatory Visit
Admission: RE | Admit: 2019-03-22 | Discharge: 2019-03-22 | Disposition: A | Discharge status: Home / Self Care | Payer: Medicare HMO | Source: Ambulatory Visit | Attending: Family Medicine | Admitting: Family Medicine

## 2019-03-22 ENCOUNTER — Ambulatory Visit: Payer: Medicare Other | Admitting: Family Medicine

## 2019-03-22 ENCOUNTER — Other Ambulatory Visit: Payer: Self-pay

## 2019-03-22 DIAGNOSIS — N644 Mastodynia: Secondary | ICD-10-CM

## 2019-03-22 DIAGNOSIS — R922 Inconclusive mammogram: Secondary | ICD-10-CM | POA: Diagnosis not present

## 2019-03-22 DIAGNOSIS — N6489 Other specified disorders of breast: Secondary | ICD-10-CM | POA: Diagnosis not present

## 2019-03-22 LAB — HM MAMMOGRAPHY

## 2019-03-24 ENCOUNTER — Encounter: Payer: Self-pay | Admitting: Family Medicine

## 2019-04-01 ENCOUNTER — Other Ambulatory Visit: Payer: Self-pay | Admitting: Family Medicine

## 2019-04-01 DIAGNOSIS — E785 Hyperlipidemia, unspecified: Secondary | ICD-10-CM

## 2019-04-01 NOTE — Telephone Encounter (Signed)
Refill sent to pharmacy. Notes from October 2020 reviewed.

## 2019-04-01 NOTE — Telephone Encounter (Signed)
Please address in Dr. Synthia Innocent absence.  Plavix Last filled:  12/31/18, #90 Last OV:  12/11/18, AWV prt 2 Next OV:  none

## 2019-04-02 ENCOUNTER — Ambulatory Visit: Payer: Medicare Other | Admitting: Family Medicine

## 2019-04-02 ENCOUNTER — Telehealth: Payer: Self-pay | Admitting: Gastroenterology

## 2019-04-02 NOTE — Telephone Encounter (Signed)
Patient aware to pick up sample kit at front desk.

## 2019-04-06 ENCOUNTER — Ambulatory Visit (INDEPENDENT_AMBULATORY_CARE_PROVIDER_SITE_OTHER): Payer: Medicare HMO

## 2019-04-06 ENCOUNTER — Other Ambulatory Visit: Payer: Self-pay | Admitting: Gastroenterology

## 2019-04-06 DIAGNOSIS — Z1159 Encounter for screening for other viral diseases: Secondary | ICD-10-CM

## 2019-04-07 LAB — SARS CORONAVIRUS 2 (TAT 6-24 HRS): SARS Coronavirus 2: NEGATIVE

## 2019-04-08 ENCOUNTER — Other Ambulatory Visit: Payer: Self-pay

## 2019-04-08 ENCOUNTER — Ambulatory Visit (AMBULATORY_SURGERY_CENTER): Payer: Medicare HMO | Admitting: Gastroenterology

## 2019-04-08 ENCOUNTER — Encounter: Payer: Self-pay | Admitting: Gastroenterology

## 2019-04-08 VITALS — BP 126/75 | HR 98 | Temp 96.9°F | Resp 20 | Ht 62.0 in | Wt 154.0 lb

## 2019-04-08 DIAGNOSIS — K5730 Diverticulosis of large intestine without perforation or abscess without bleeding: Secondary | ICD-10-CM | POA: Diagnosis not present

## 2019-04-08 DIAGNOSIS — R194 Change in bowel habit: Secondary | ICD-10-CM | POA: Diagnosis not present

## 2019-04-08 DIAGNOSIS — D125 Benign neoplasm of sigmoid colon: Secondary | ICD-10-CM | POA: Diagnosis not present

## 2019-04-08 DIAGNOSIS — R110 Nausea: Secondary | ICD-10-CM

## 2019-04-08 DIAGNOSIS — R195 Other fecal abnormalities: Secondary | ICD-10-CM

## 2019-04-08 DIAGNOSIS — I509 Heart failure, unspecified: Secondary | ICD-10-CM | POA: Diagnosis not present

## 2019-04-08 DIAGNOSIS — I2510 Atherosclerotic heart disease of native coronary artery without angina pectoris: Secondary | ICD-10-CM | POA: Diagnosis not present

## 2019-04-08 MED ORDER — SODIUM CHLORIDE 0.9 % IV SOLN: 500.0000 mL | Freq: Once | INTRAVENOUS | Status: DC

## 2019-04-08 NOTE — Progress Notes (Signed)
Called to room to assist during endoscopic procedure.  Patient ID and intended procedure confirmed with present staff. Received instructions for my participation in the procedure from the performing physician.  

## 2019-04-08 NOTE — Patient Instructions (Addendum)
Please, call your PCP regarding your blood sugar.  It was elevated.  You may resume your plavix tomorrow.Maria Fox for choosing [place] for your healthcare needs today.  Check your blood sugar every day twice per day for a couple of weeks and keep a log.  This will help Dr. Sarita Bottom  YOU HAD AN ENDOSCOPIC PROCEDURE TODAY AT THE [facility]:   Refer to the procedure report that was given to you for any specific questions about what was found during the examination.  If the procedure report does not answer your questions, please call your gastroenterologist to clarify.  If you requested that your care partner not be given the details of your procedure findings, then the procedure report has been included in a sealed envelope for you to review at your convenience later.  YOU SHOULD EXPECT: Some feelings of bloating in the abdomen. Passage of more gas than usual.  Walking can help get rid of the air that was put into your GI tract during the procedure and reduce the bloating. If you had a lower endoscopy (such as a colonoscopy or flexible sigmoidoscopy) you may notice spotting of blood in your stool or on the toilet paper. If you underwent a bowel prep for your procedure, you may not have a normal bowel movement for a few days.  Please Note:  You might notice some irritation and congestion in your nose or some drainage.  This is from the oxygen used during your procedure.  There is no need for concern and it should clear up in a day or so.  SYMPTOMS TO REPORT IMMEDIATELY:   Following lower endoscopy (colonoscopy or flexible sigmoidoscopy):  Excessive amounts of blood in the stool  Significant tenderness or worsening of abdominal pains  Swelling of the abdomen that is new, acute  Fever of 100F or higher   Following upper endoscopy (EGD)  Vomiting of blood or coffee ground material  New chest pain or pain under the shoulder blades  Painful or persistently difficult swallowing  New  shortness of breath  Fever of 100F or higher  Black, tarry-looking stools  For urgent or emergent issues, a gastroenterologist can be reached at any hour by calling [phone].   DIET:  We do recommend a small meal at first, but then you may proceed to your regular diet.  Drink plenty of fluids but you should avoid alcoholic beverages for 24 hours. Try to increase the fiber and drink plenty of water.  ACTIVITY:  You should plan to take it easy for the rest of today and you should NOT DRIVE or use heavy machinery until tomorrow (because of the sedation medicines used during the test).    FOLLOW UP: Our staff will call the number listed on your records 48-72 hours following your procedure to check on you and address any questions or concerns that you may have regarding the information given to you following your procedure. If we do not reach you, we will leave a message.  We will attempt to reach you two times.  During this call, we will ask if you have developed any symptoms of COVID 19. If you develop any symptoms (ie: fever, flu-like symptoms, shortness of breath, cough etc.) before then, please call [phone].  If you test positive for Covid 19 in the 2 weeks post procedure, please call and report this information to [place].    If any biopsies were taken you will be contacted by phone or by letter within  the next 1-3 weeks.  Please call us at [phone] if you have not heard about the biopsies in 3 weeks.    SIGNATURES/CONFIDENTIALITY: You and/or your care partner have signed paperwork which will be entered into your electronic medical record.  These signatures attest to the fact that that the information above on your After Visit Summary has been reviewed and is understood.  Full responsibility of the confidentiality of this discharge information lies with you and/or your care-partner.

## 2019-04-08 NOTE — Op Note (Signed)
[facility] Patient Name: Maria Fox Procedure Date: 04/08/2019 4:22 PM MRN: [id number] Endoscopist: Mallie Mussel L. Loletha Carrow , MD Age: 74 Referring MD:  Date of Birth: 1945-04-01 Gender: Female Account #: [id number] Procedure:                Upper GI endoscopy Indications:              Heme positive stool, Nausea (intermittent) Medicines:                Monitored Anesthesia Care Procedure:                Pre-Anesthesia Assessment:                           - Prior to the procedure, a History and Physical                            was performed, and patient medications and                            allergies were reviewed. The patient's tolerance of                            previous anesthesia was also reviewed. The risks                            and benefits of the procedure and the sedation                            options and risks were discussed with the patient.                            All questions were answered, and informed consent                            was obtained. Prior Anticoagulants: The patient has                            taken Plavix (clopidogrel), last dose was 5 days                            prior to procedure (remained on aspirin). ASA Grade                            Assessment: III - A patient with severe systemic                            disease. After reviewing the risks and benefits,                            the patient was deemed in satisfactory condition to                            undergo the procedure.  After obtaining informed consent, the endoscope was                            passed under direct vision. Throughout the                            procedure, the patient's blood pressure, pulse, and                            oxygen saturations were monitored continuously. The                            Endoscope was introduced through the mouth, and                            advanced to the second part  of duodenum. The upper                            GI endoscopy was accomplished without difficulty.                            The patient tolerated the procedure well. Scope In: Scope Out: Findings:                 The larynx was normal.                           The esophagus was normal.                           Patchy atrophic mucosa was found in the gastric                            body and in the gastric antrum.                           The exam of the stomach was otherwise normal,                            including on retroflexion.                           The examined duodenum was normal. Complications:            No immediate complications. Estimated Blood Loss:     Estimated blood loss: none. Impression:               - Normal larynx.                           - Normal esophagus.                           - Gastric mucosal atrophy.                           - Normal examined duodenum.                           -  No specimens collected.                           No cause for heme positive stool was seen. Recommendation:           - Patient has a contact number available for                            emergencies. The signs and symptoms of potential                            delayed complications were discussed with the                            patient. Return to normal activities tomorrow.                            Written discharge instructions were provided to the                            patient.                           - Resume previous diet.                           - Resume Plavix (clopidogrel) at prior dose                            tomorrow.                           - See the other procedure note for documentation of                            additional recommendations. Collins Dimaria L. Loletha Carrow, MD 04/08/2019 5:07:10 PM This report has been signed electronically.

## 2019-04-08 NOTE — Op Note (Signed)
[facility] Patient Name: Maria Fox Procedure Date: 04/08/2019 4:22 PM MRN: [id number] Endoscopist: Mallie Mussel L. Loletha Carrow , MD Age: 74 Referring MD:  Date of Birth: 12-Jul-1945 Gender: Female Account #: [id number] Procedure:                Colonoscopy Indications:              Heme positive stool (normal hemoglobin), Change in                            bowel habits Medicines:                Monitored Anesthesia Care Procedure:                Pre-Anesthesia Assessment:                           - Prior to the procedure, a History and Physical                            was performed, and patient medications and                            allergies were reviewed. The patient's tolerance of                            previous anesthesia was also reviewed. The risks                            and benefits of the procedure and the sedation                            options and risks were discussed with the patient.                            All questions were answered, and informed consent                            was obtained. Prior Anticoagulants: The patient has                            taken Plavix (clopidogrel), last dose was 5 days                            prior to procedure (remained on aspirin). ASA Grade                            Assessment: III - A patient with severe systemic                            disease. After reviewing the risks and benefits,                            the patient was deemed in satisfactory condition to  undergo the procedure.                           After obtaining informed consent, the colonoscope                            was passed under direct vision. Throughout the                            procedure, the patient's blood pressure, pulse, and                            oxygen saturations were monitored continuously. The                            Colonoscope was introduced through the anus and                 advanced to the the cecum, identified by                            appendiceal orifice and ileocecal valve. The                            colonoscopy was performed without difficulty. The                            patient tolerated the procedure well. The quality                            of the bowel preparation was good. The ileocecal                            valve, appendiceal orifice, and rectum were                            photographed. The quality of the bowel preparation                            was evaluated using the BBPS [facility] Bowel                            Preparation Scale) with scores of: Right Colon = 3,                            Transverse Colon = 2 and Left Colon = 2. The total                            BBPS score equals 7. The bowel preparation used was                            SUPREP. Scope In: 4:24:50 PM Scope Out: [id number] PM Scope Withdrawal Time: 0 hours 9 minutes 58 seconds  Total Procedure Duration: 0 hours 11 minutes 52 seconds  Findings:  The digital rectal exam findings include decreased                            sphincter tone.                           Multiple medium-mouthed diverticula were found in                            the left colon and right colon.                           A diminutive polyp was found in the sigmoid colon.                            The polyp was sessile. The polyp was removed with a                            cold biopsy forceps. Resection and retrieval were                            complete.                           Normal mucosa was found in the entire colon.                            Biopsies for histology were taken with a cold                            forceps from the right colon and left colon for                            evaluation of microscopic colitis.                           The exam was otherwise without abnormality on                            direct and  retroflexion views. Complications:            No immediate complications. Estimated Blood Loss:     Estimated blood loss was minimal. Impression:               - Decreased sphincter tone found on digital rectal                            exam.                           - Diverticulosis in the left colon and in the right                            colon.                           -  One diminutive polyp in the sigmoid colon,                            removed with a cold biopsy forceps. Resected and                            retrieved.                           - Normal mucosa in the entire examined colon.                            Biopsied.                           - The examination was otherwise normal on direct                            and retroflexion views.                           No cause for heme positive stool seen. Recommendation:           - Patient has a contact number available for                            emergencies. The signs and symptoms of potential                            delayed complications were discussed with the                            patient. Return to normal activities tomorrow.                            Written discharge instructions were provided to the                            patient.                           - Resume previous diet.                           - Continue present medications.                           - Await pathology results.                           - See the other procedure note for documentation of                            additional recommendations. Henry L. Loletha Carrow, MD 04/08/2019 4:54:03 PM This report has been signed electronically.

## 2019-04-08 NOTE — Progress Notes (Signed)
Report given to PACU, vss 

## 2019-04-12 ENCOUNTER — Telehealth: Payer: Self-pay | Admitting: *Deleted

## 2019-04-12 NOTE — Telephone Encounter (Signed)
  Follow up Call-  Call back number 04/08/2019  Post procedure Call Back phone  # [phone]  Permission to leave phone message Yes  Some recent data might be hidden     Patient questions:  Do you have a fever, pain , or abdominal swelling? No. Pain Score  0 *  Have you tolerated food without any problems? Yes.    Have you been able to return to your normal activities? Yes.    Do you have any questions about your discharge instructions: Diet   No. Medications  No. Follow up visit  No.  Do you have questions or concerns about your Care? No.  Actions: * If pain score is 4 or above: 1. No action needed, pain <4.Have you developed a fever since your procedure? no  2.   Have you had an respiratory symptoms (SOB or cough) since your procedure? no  3.   Have you tested positive for COVID 19 since your procedure no  4.   Have you had any family members/close contacts diagnosed with the COVID 19 since your procedure?  no   If yes to any of these questions please route to Joylene John, RN and Alphonsa Gin, Therapist, sports.

## 2019-04-17 ENCOUNTER — Other Ambulatory Visit: Payer: Self-pay | Admitting: Family Medicine

## 2019-04-19 NOTE — Telephone Encounter (Signed)
Please advise if further refills are appropriate:  Last refill: 7.5 mL, 11 Last OV: 1.4.21 dx. Right breast pain

## 2019-04-20 ENCOUNTER — Encounter: Payer: Self-pay | Admitting: Gastroenterology

## 2019-04-21 NOTE — Telephone Encounter (Signed)
Glaucoma drops need to go through ophthalmology

## 2019-04-25 ENCOUNTER — Other Ambulatory Visit: Payer: Self-pay | Admitting: Family Medicine

## 2019-05-03 ENCOUNTER — Telehealth: Payer: Self-pay | Admitting: Family Medicine

## 2019-05-03 NOTE — Telephone Encounter (Signed)
Spoke with pt informing her the inhaler is prescribed by pulmonology.  Says she will contact them for refill.

## 2019-05-03 NOTE — Telephone Encounter (Signed)
Patient called requesting refill to her pharmacy She stated she had to call to request this.   She is needing her albuterol inhaler sent to her pharmacy     [facility] [place]

## 2019-05-20 IMAGING — CR DG CHEST 2V
2 series · 2 of 2 positions shown · non-contrast
Comparison: 10/09/2017

CLINICAL DATA: Arm pain generalized abdominal pain with nausea

EXAM:
CHEST - 2 VIEW

[chest pa]
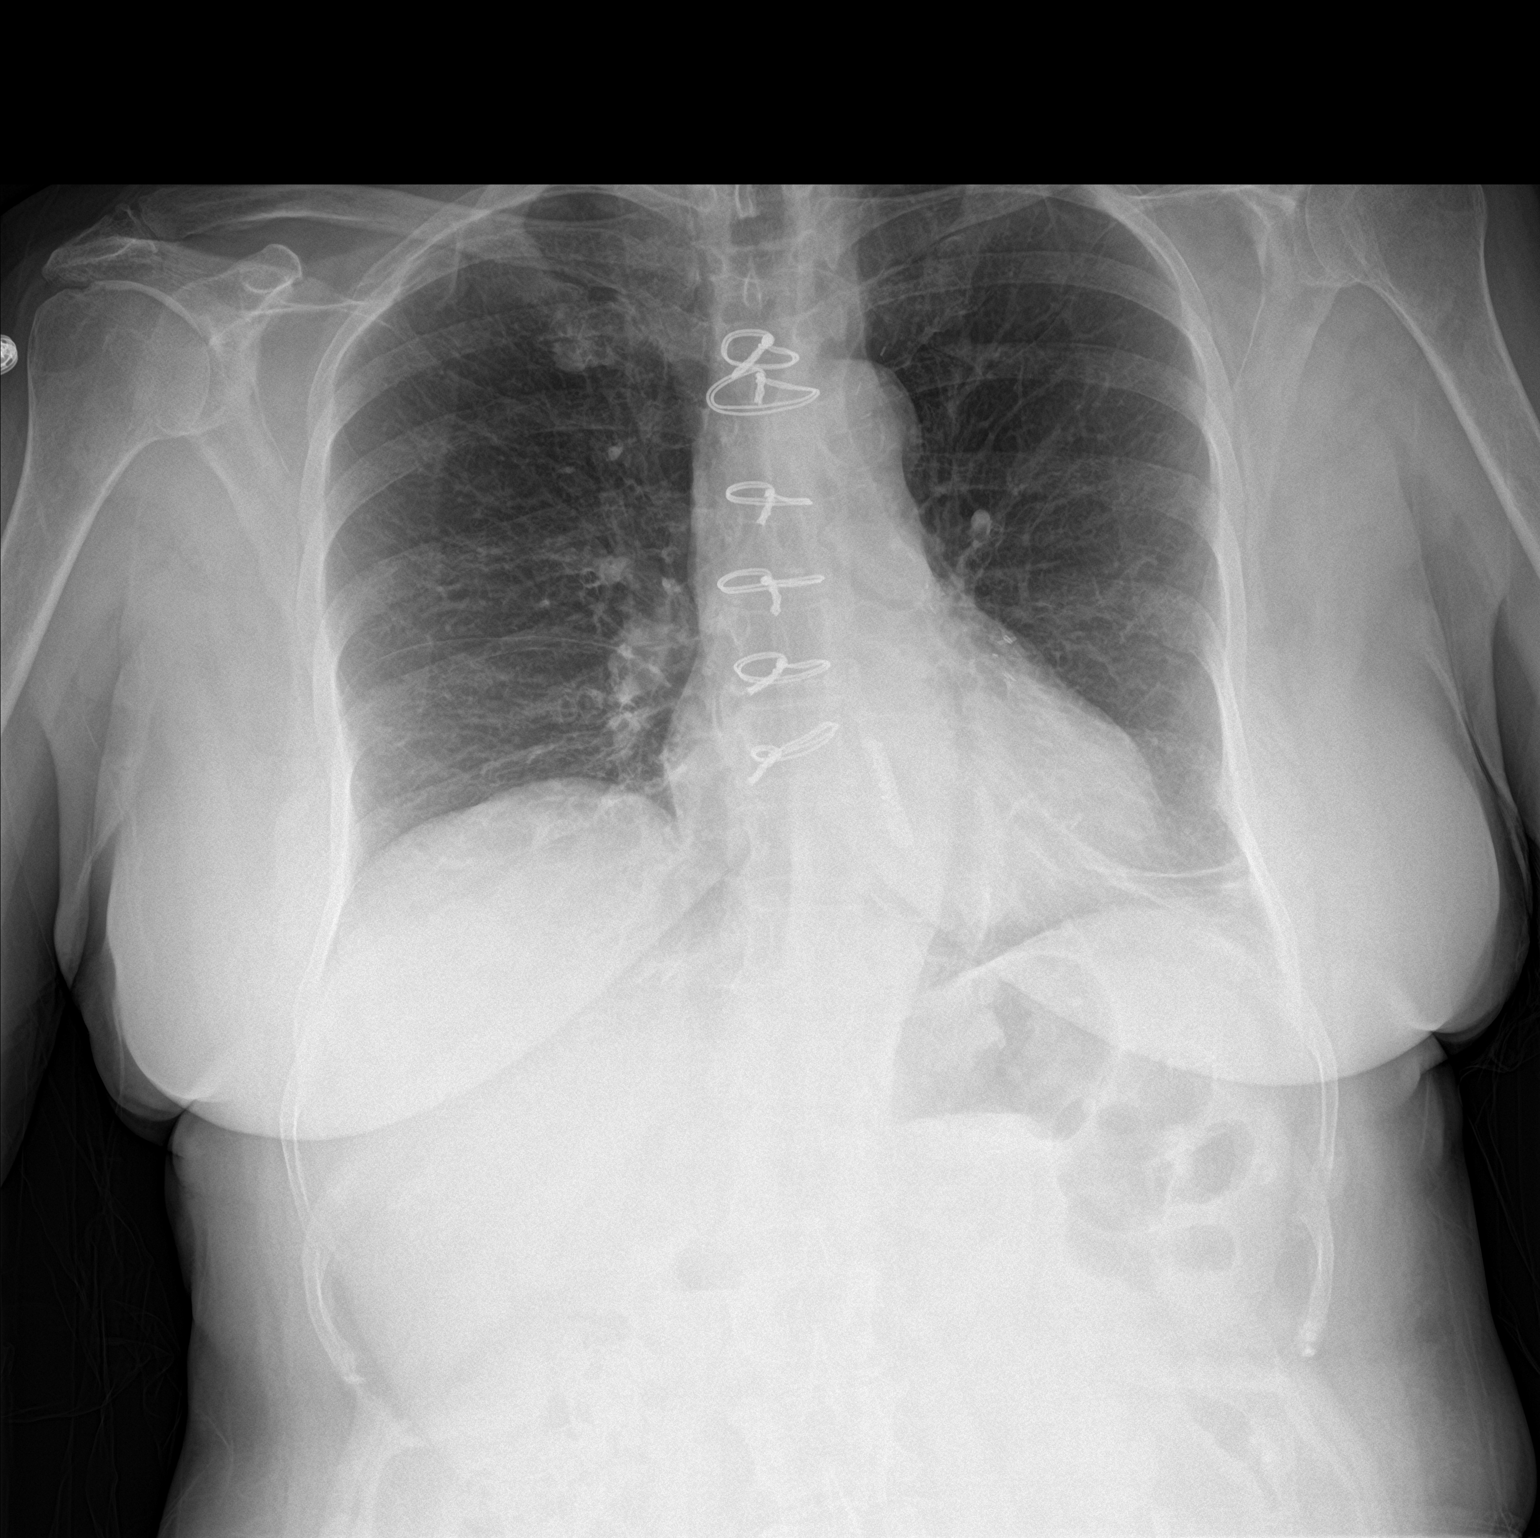

[chest lat]
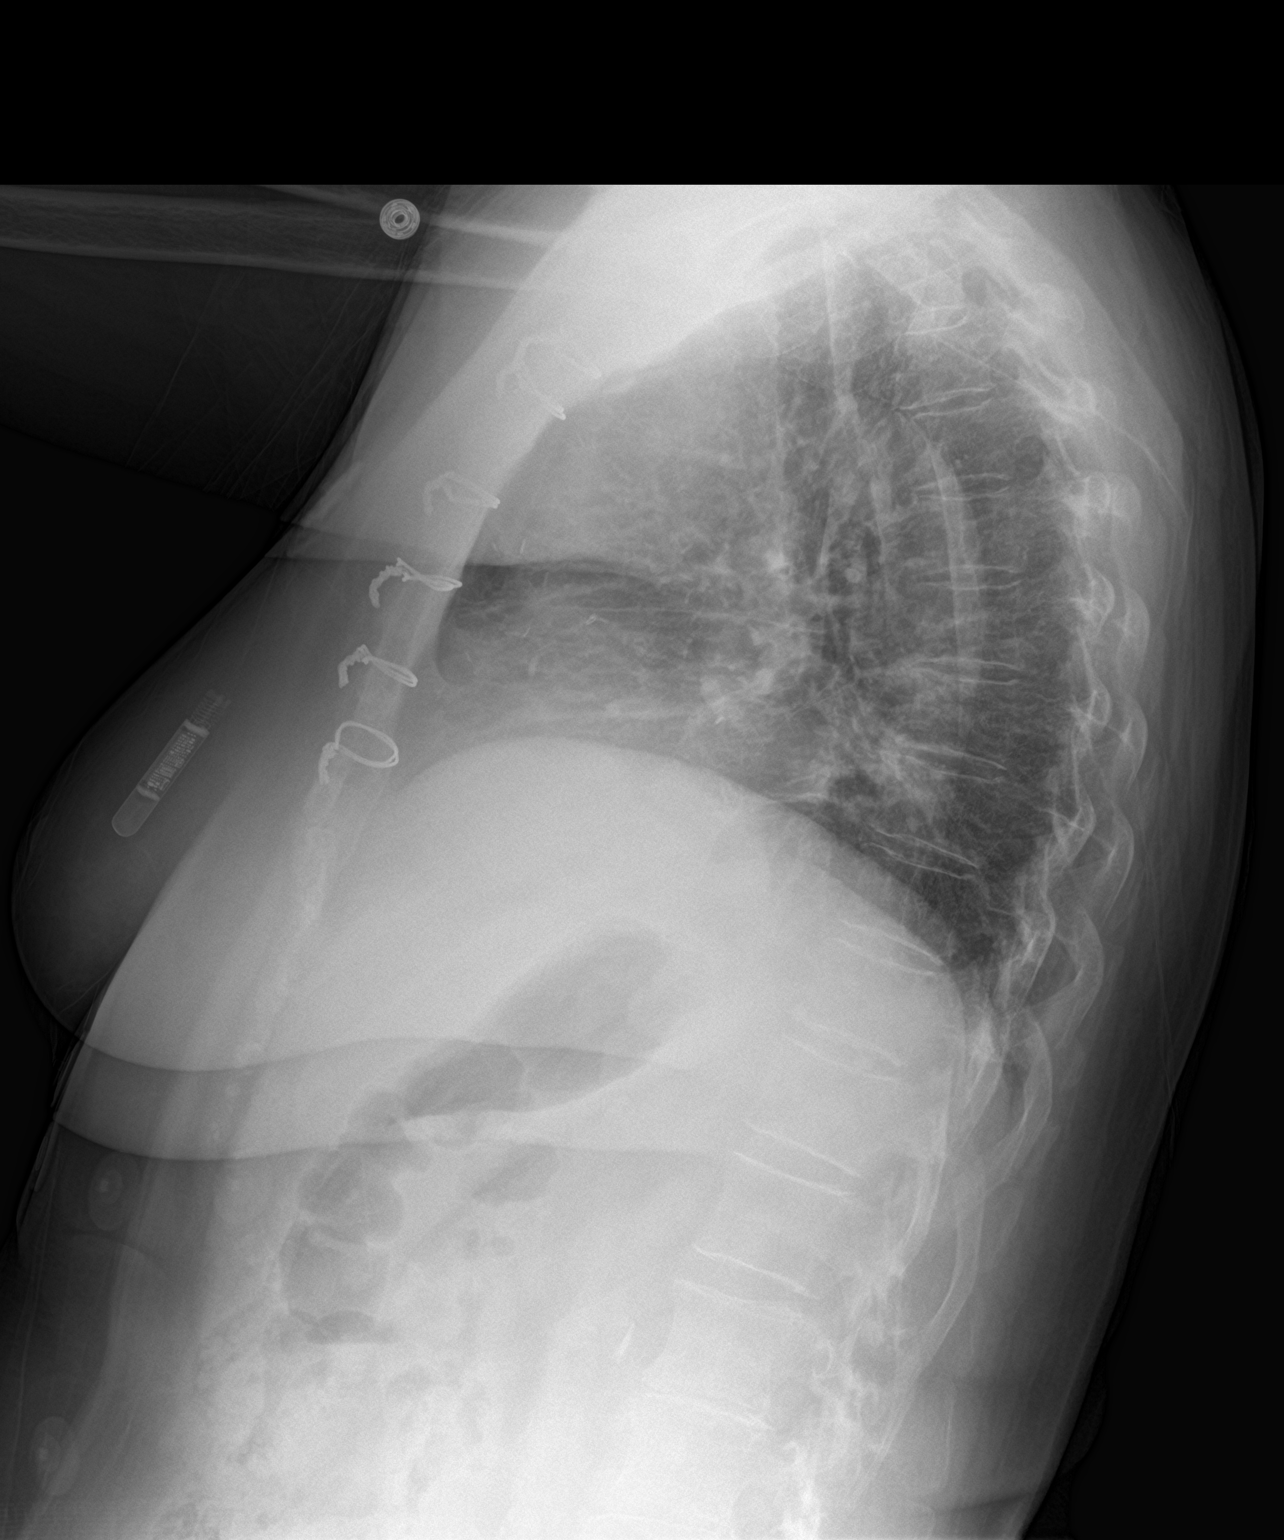

[2 of 2 positions shown; findings below may reference images not displayed]

FINDINGS: Post sternotomy changes. Scarring or atelectasis at the bases. No
focal consolidation or pleural effusion. Cardiomediastinal
silhouette within normal limits. Aortic atherosclerosis. No
pneumothorax.
IMPRESSION: No active cardiopulmonary disease. Linear atelectasis or scarring at
the left greater than right lung base.

## 2019-05-20 IMAGING — CT CT RENAL STONE PROTOCOL
2 of 4 series · 16 of 46 positions shown, 18 images · non-contrast
Comparison: 04/21/2017

CLINICAL DATA: Generalized abdominal pain with nausea and diarrhea.
Cardiac stent placed last week.

EXAM:
CT ABDOMEN AND PELVIS WITHOUT CONTRAST
TECHNIQUE: Multidetector CT imaging of the abdomen and pelvis was performed
following the standard protocol without IV contrast.

[Series 4: stone study 5.0 i30f 2 · axial · 0.89mm/px · z∈[+753,+1203]mm · 13 of 100 slices shown, 15 images]
[im 5/100  soft-tissue]
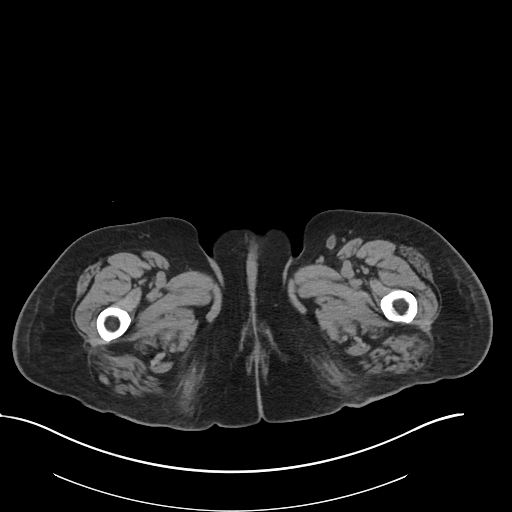
[im 5/100  bone]
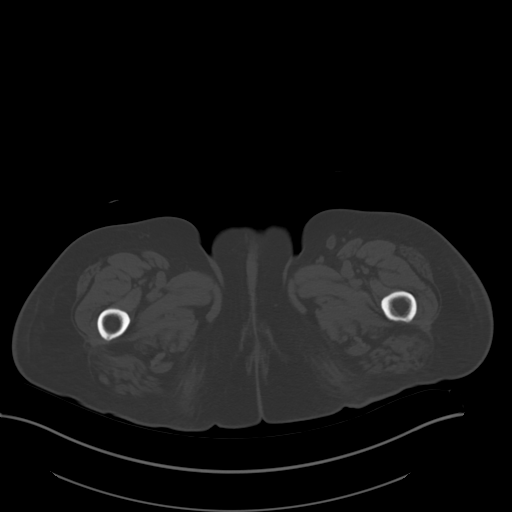
[im 13/100  soft-tissue]
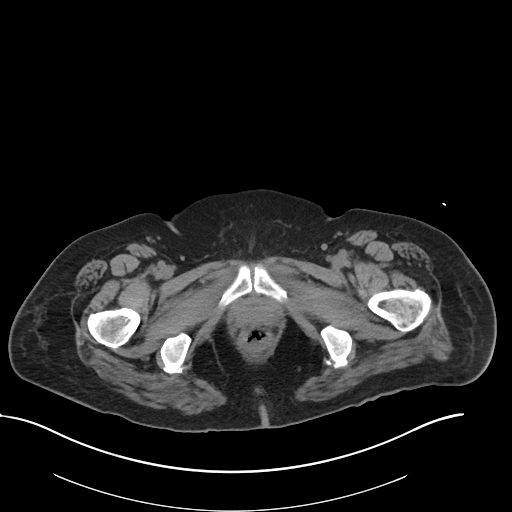
[im 21/100  soft-tissue]
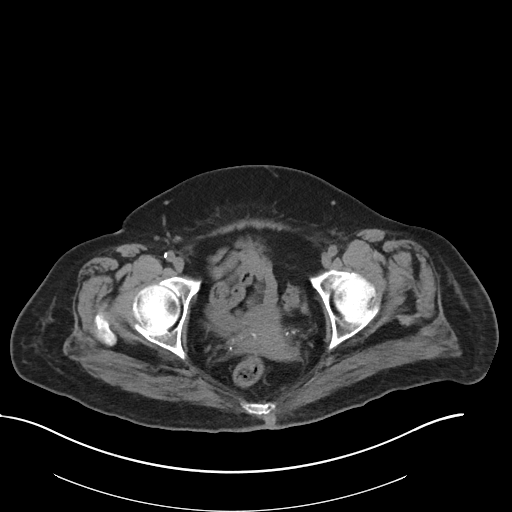
[im 29/100  soft-tissue]
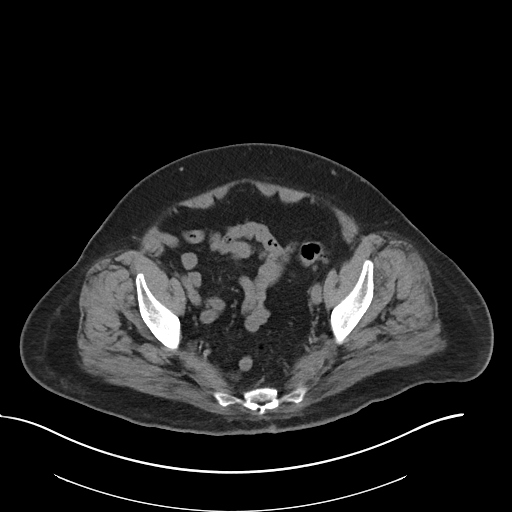
[im 34/100  soft-tissue]
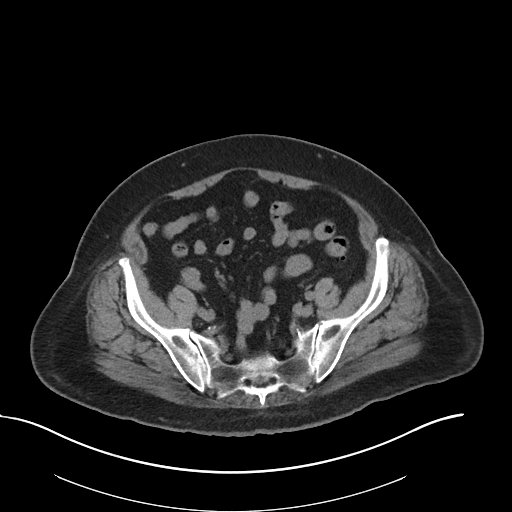
[im 42/100  soft-tissue]
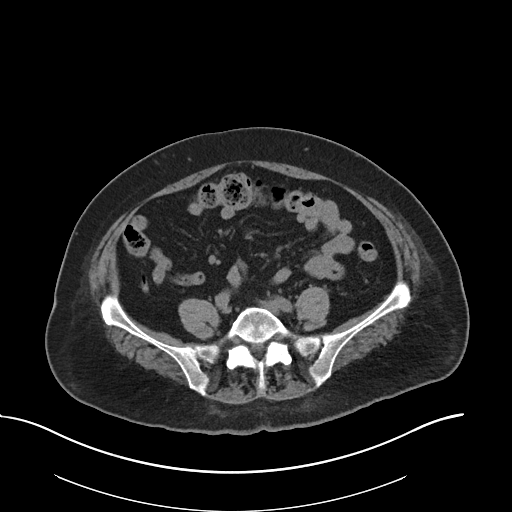
[im 50/100  soft-tissue]
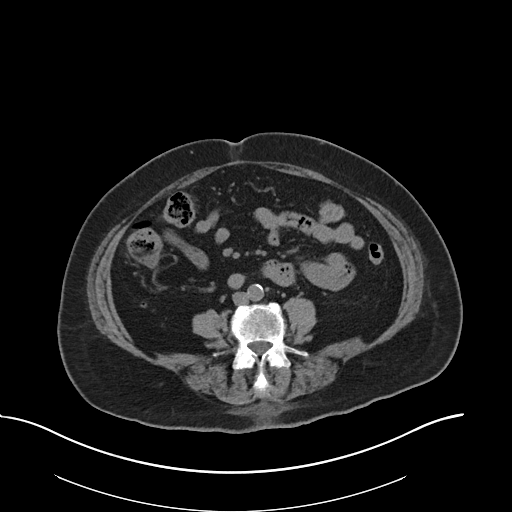
[im 58/100  soft-tissue]
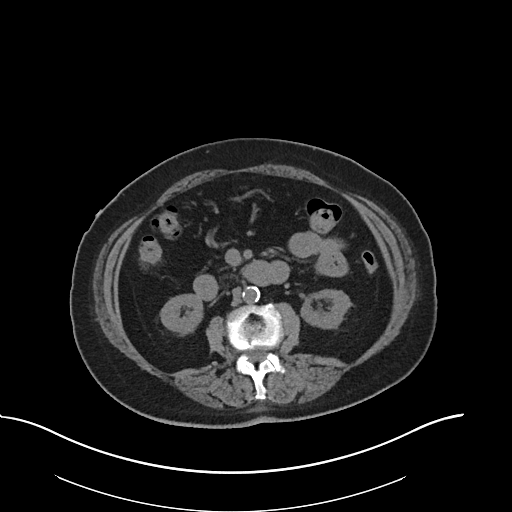
[im 67/100  soft-tissue]
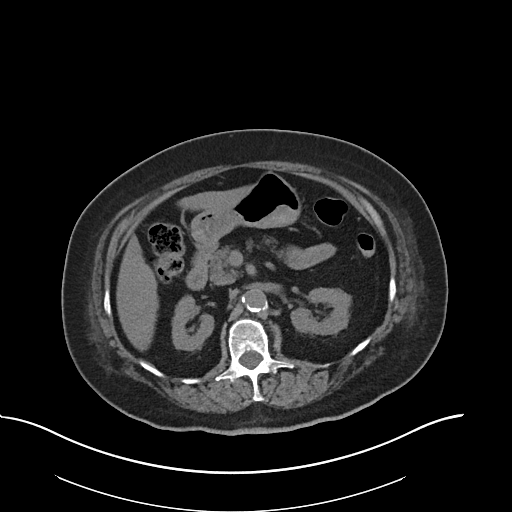
[im 67/100  bone]
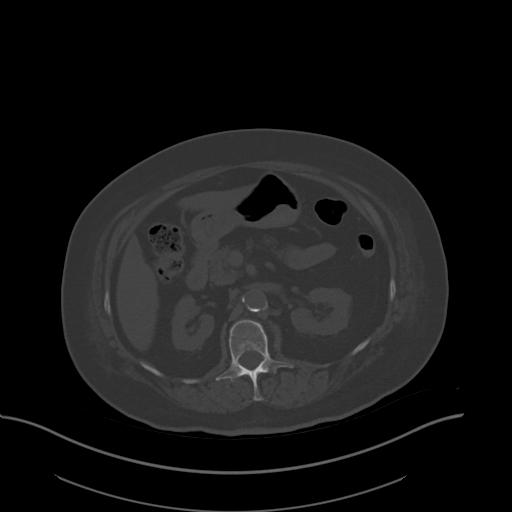
[im 71/100  soft-tissue]
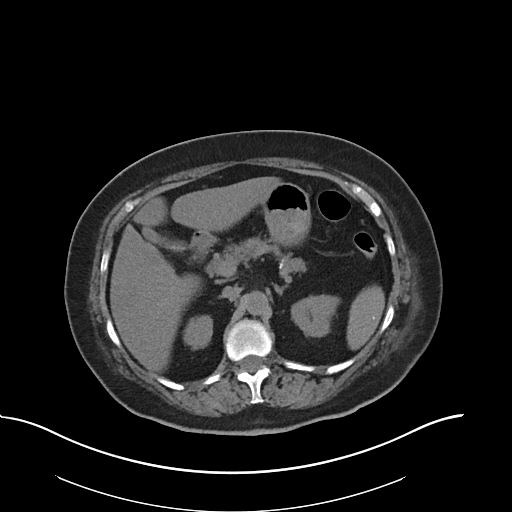
[im 79/100  soft-tissue]
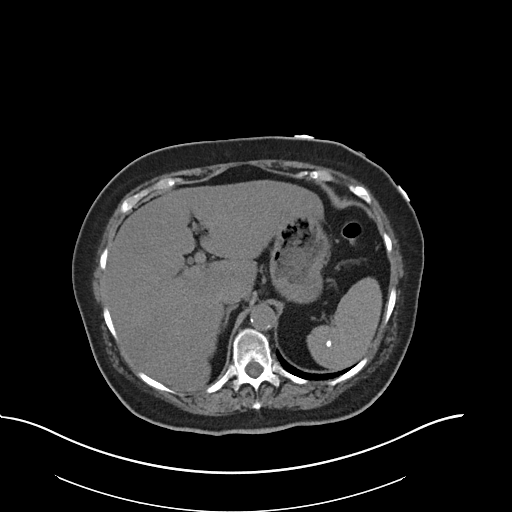
[im 87/100  soft-tissue]
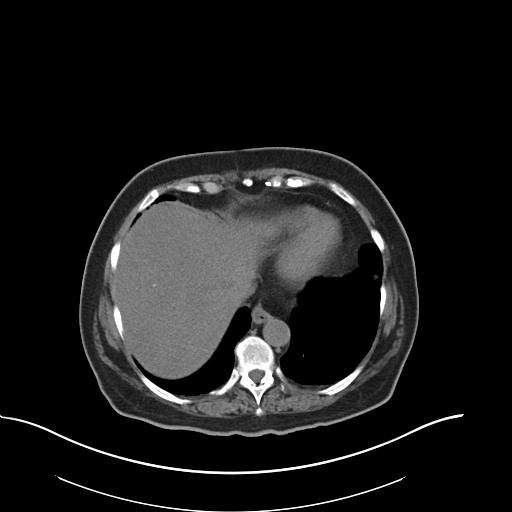
[im 95/100  soft-tissue]
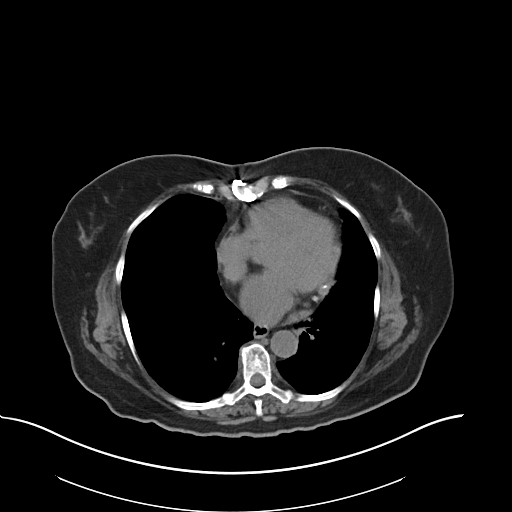

[Series 7: coronal soft tissue · coronal · 0.84mm/px · 3 of 101 slices shown]
[im 34/101  soft-tissue]
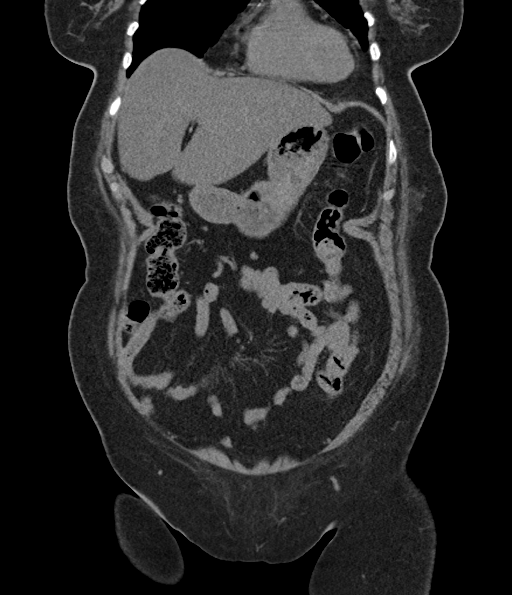
[im 45/101  soft-tissue]
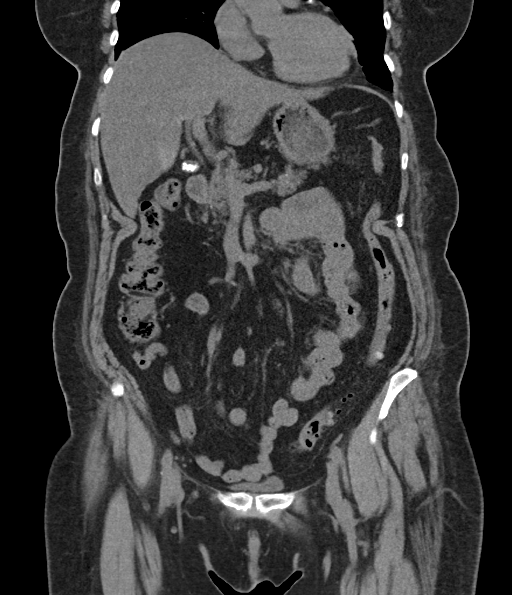
[im 56/101  soft-tissue]
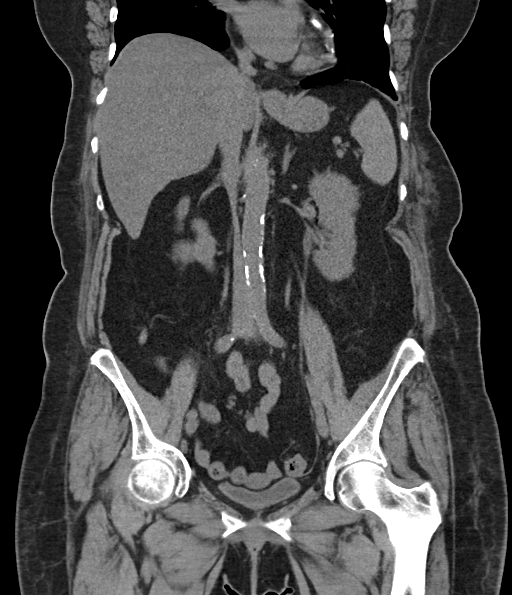

[16 of 46 positions shown; findings below may reference images not displayed]

FINDINGS: Lower chest: Postoperative changes in the mediastinum. Mild
atelectasis in the lung bases. Mild pericardial thickening is likely
postoperative.

Hepatobiliary: Diffuse fatty infiltration of the liver. Scattered
calcified granulomas. Multiple stones in the gallbladder with
gallbladder contraction. No inflammatory infiltration. Bile ducts
are not dilated.

Pancreas: Unremarkable. No pancreatic ductal dilatation or
surrounding inflammatory changes.

Spleen: Calcified granulomas in the spleen.

Adrenals/Urinary Tract: Adrenal glands are unremarkable. Kidneys are
normal, without renal calculi, focal lesion, or hydronephrosis.
Bladder is unremarkable.

Stomach/Bowel: Stomach, small bowel, and colon are not abnormally
distended. No wall thickening or inflammatory changes appreciated.
Scattered diverticula in the colon. No evidence of diverticulitis.
Appendix is normal.

Vascular/Lymphatic: Aortic atherosclerosis. No enlarged abdominal or
pelvic lymph nodes.

Reproductive: Uterus and bilateral adnexa are unremarkable.

Other: No abdominal wall hernia or abnormality. No abdominopelvic
ascites.

Musculoskeletal: Degenerative changes in the spine. No destructive
bone lesions.
IMPRESSION: 1. No renal or ureteral stone or obstruction.
2. No evidence of bowel obstruction or inflammation.
3. Diffuse fatty infiltration of the liver.
4. Cholelithiasis with gallbladder contraction but no inflammatory
changes.
5. Calcified granulomas in the liver and spleen.

## 2019-06-01 ENCOUNTER — Telehealth: Payer: Self-pay | Admitting: Family Medicine

## 2019-06-01 NOTE — Progress Notes (Signed)
  Chronic Care Management   Note  06/01/2019 Name: EUGENE PROSPERI MRN: [id number] DOB: 11/24/1945  AERIE DELLACAMERA is a 74 y.o. year old female who is a primary care patient of [name], MD. I reached out to [name] by phone today in response to a referral sent by Ms. [place] PCP, [name], MD.   Ms. Pone was given information about Chronic Care Management services today including:  1. CCM service includes personalized support from designated clinical staff supervised by her physician, including individualized plan of care and coordination with other care providers 2. 24/7 contact phone numbers for assistance for urgent and routine care needs. 3. Service will only be billed when office clinical staff spend 20 minutes or more in a month to coordinate care. 4. Only one practitioner may furnish and bill the service in a calendar month. 5. The patient may stop CCM services at any time (effective at the end of the month) by phone call to the office staff.   Patient agreed to services and verbal consent obtained.   Follow up plan:   Raynicia Dukes [employer] Scheduler

## 2019-06-04 ENCOUNTER — Ambulatory Visit: Payer: Medicare Other | Admitting: Podiatry

## 2019-06-21 ENCOUNTER — Telehealth: Payer: Self-pay

## 2019-06-21 ENCOUNTER — Other Ambulatory Visit: Payer: Self-pay

## 2019-06-21 ENCOUNTER — Ambulatory Visit: Payer: Medicare HMO

## 2019-06-21 DIAGNOSIS — E1122 Type 2 diabetes mellitus with diabetic chronic kidney disease: Secondary | ICD-10-CM

## 2019-06-21 DIAGNOSIS — M8580 Other specified disorders of bone density and structure, unspecified site: Secondary | ICD-10-CM

## 2019-06-21 DIAGNOSIS — I10 Essential (primary) hypertension: Secondary | ICD-10-CM

## 2019-06-21 DIAGNOSIS — E1149 Type 2 diabetes mellitus with other diabetic neurological complication: Secondary | ICD-10-CM

## 2019-06-21 DIAGNOSIS — I5032 Chronic diastolic (congestive) heart failure: Secondary | ICD-10-CM

## 2019-06-21 DIAGNOSIS — K760 Fatty (change of) liver, not elsewhere classified: Secondary | ICD-10-CM

## 2019-06-21 DIAGNOSIS — K219 Gastro-esophageal reflux disease without esophagitis: Secondary | ICD-10-CM

## 2019-06-21 DIAGNOSIS — N183 Chronic kidney disease, stage 3 unspecified: Secondary | ICD-10-CM

## 2019-06-21 DIAGNOSIS — IMO0002 Reserved for concepts with insufficient information to code with codable children: Secondary | ICD-10-CM

## 2019-06-21 DIAGNOSIS — G4700 Insomnia, unspecified: Secondary | ICD-10-CM

## 2019-06-21 DIAGNOSIS — E1169 Type 2 diabetes mellitus with other specified complication: Secondary | ICD-10-CM

## 2019-06-21 DIAGNOSIS — E538 Deficiency of other specified B group vitamins: Secondary | ICD-10-CM

## 2019-06-21 DIAGNOSIS — I2583 Coronary atherosclerosis due to lipid rich plaque: Secondary | ICD-10-CM

## 2019-06-21 DIAGNOSIS — F332 Major depressive disorder, recurrent severe without psychotic features: Secondary | ICD-10-CM

## 2019-06-21 DIAGNOSIS — I2510 Atherosclerotic heart disease of native coronary artery without angina pectoris: Secondary | ICD-10-CM

## 2019-06-21 DIAGNOSIS — E611 Iron deficiency: Secondary | ICD-10-CM

## 2019-06-21 NOTE — Telephone Encounter (Signed)
I would like to request a referral for Maria Fox to chronic care management pharmacy services for the following conditions:   Essential hypertension, benign  123456  Chronic diastolic CHF (congestive heart failure) [I50.32]  [name], PharmD Clinical Pharmacist [facility] [phone]

## 2019-06-21 NOTE — Chronic Care Management (AMB) (Signed)
Chronic Care Management Pharmacy  Name: Maria Fox  MRN: [id number] DOB: 1946-01-29  Chief Complaint/ HPI  [name],  74 y.o., female presents for their Initial CCM visit with the clinical pharmacist via telephone.  PCP : [name], MD  Their chronic conditions include: hypertension, chronic heart failure, coronary artery disease, fatty liver disease, GERD, hyperlipidemia, diabetes, chronic kidney disease, osteopenia, depressive disorder, insomnia, iron deficiency, B12 deficiency   Patient concerns: reports colonoscopy and endoscopy were clear, nausea has improved; denies medication changes in past 6 months   Office Visits:  03/15/19: [name] - possible mastitis, doxycyline course, schedule mammogram, diabetes uncontrolled, review SGLT2 GLP1 at next visit, affordability remains a concern, rtc 2 weeks  12/11/19: [name] - restart Cymbalta 20 mg daily, change glimepiride to glipizide 5 mg BID, check with insurance for preferred glucose meter, rtc 4 months  Consult Visit:  12/24/18: Cardiology - cont current meds  Allergies  Allergen Reactions  . Mushroom Extract Complex Anaphylaxis  . Codeine Nausea And Vomiting  . Penicillins Hives and Swelling    Has patient had a PCN reaction causing immediate rash, facial/tongue/throat swelling, SOB or lightheadedness with hypotension: Yes Has patient had a PCN reaction causing severe rash involving mucus membranes or skin necrosis: Yes Has patient had a PCN reaction that required hospitalization Yes Has patient had a PCN reaction occurring within the last 10 years: No If all of the above answers are "NO", then may proceed with Cephalosporin use.   . Sulfa Antibiotics Nausea And Vomiting  . Bee Venom     Throat swelling  . Erythromycin Base Rash   Medications: Outpatient Encounter Medications as of 06/21/2019  Medication Sig  . acetaminophen (TYLENOL) 500 MG tablet Take 500 mg by mouth as needed.  Marland Kitchen albuterol (PROAIR  HFA) 108 (90 Base) MCG/ACT inhaler Inhale 2 puffs into the lungs every 6 (six) hours as needed for wheezing or shortness of breath.  Marland Kitchen aspirin EC 81 MG tablet Take 81 mg by mouth daily.   Marland Kitchen atorvastatin (LIPITOR) 40 MG tablet TAKE 1 TABLET BY MOUTH EVERYDAY AT BEDTIME  . Blood Glucose Monitoring Suppl (ONE TOUCH ULTRA 2) w/Device KIT Use as instructed to check blood sugar daily.  . clopidogrel (PLAVIX) 75 MG tablet TAKE 1 TABLET BY MOUTH EVERY DAY  . Cyanocobalamin (B-12) 1000 MCG SUBL Place 1 tablet under the tongue daily.  Marland Kitchen docusate sodium (COLACE) 100 MG capsule Take 1 capsule (100 mg total) by mouth daily. (Patient not taking: Reported on 04/08/2019)  . doxycycline (VIBRAMYCIN) 100 MG capsule Take 1 capsule (100 mg total) by mouth 2 (two) times daily. (Patient not taking: Reported on 04/08/2019)  . furosemide (LASIX) 20 MG tablet TAKE 1 TABLET BY MOUTH EVERY DAY  . gabapentin (NEURONTIN) 100 MG capsule TAKE 1 CAPSULE BY MOUTH EVERY MORNING TAKE 1 CAPSULE IN AFTERNOON AND 3 CAPSULES IN THE EVENING  . glipiZIDE (GLUCOTROL) 5 MG tablet Take 5 mg by mouth 2 (two) times daily.  . isosorbide mononitrate (IMDUR) 30 MG 24 hr tablet Take 0.5 tablets (15 mg total) by mouth daily.  Marland Kitchen latanoprost (XALATAN) 0.005 % ophthalmic solution   . metFORMIN (GLUCOPHAGE) 500 MG tablet Take 1 tablet (500 mg total) by mouth 3 (three) times daily before meals.  . metoCLOPramide (REGLAN) 10 MG tablet Take 1 tablet (10 mg total) by mouth every 6 (six) hours as needed for nausea or vomiting.  . metoprolol tartrate (LOPRESSOR) 25 MG tablet TAKE 1/2 TABLET BY MOUTH  DAILY  . NON FORMULARY Antifungal cream from [place] as needed  . OneTouch Delica Lancets 56O MISC Use as directed to check blood sugar daily  . ONETOUCH ULTRA test strip USE AS INSTRUCTED TO CHECK BLOOD SUGAR DAILY  . pantoprazole (PROTONIX) 20 MG tablet TAKE 1 TABLET BY MOUTH EVERY DAY (Patient not taking: Reported on 04/08/2019)  . traZODone  (DESYREL) 50 MG tablet Take 1.5 tablets (75 mg total) by mouth at bedtime as needed for sleep. (Patient not taking: Reported on 04/08/2019)   No facility-administered encounter medications on file as of 06/21/2019.   Current Diagnosis/Assessment: Goals    . Increase physical activity     Starting 12/26/2016, I will continue to walk for 30 min daily.     . Patient Stated     12/08/2018, Patient wants to maintain and continue medications as prescribed.     . Pharmacy Care Plan     CARE PLAN ENTRY  Current Barriers:  . Chronic Disease Management support, education, and care coordination needs related to hypertension, chronic heart failure, coronary artery disease, fatty liver disease, GERD, hyperlipidemia, diabetes, chronic kidney disease, osteopenia, depressive disorder, insomnia, iron deficiency, B12 deficiency   Pharmacist Clinical Goal(s):  Marland Kitchen Over the next 2 weeks, patient will work with PharmD and primary care provider to address the following goals: Marland Kitchen Diabetes: Patient will begin monitoring blood glucose twice daily before breakfast and 1-2 hours after meal. Fasting blood glucose goal of 80-130 mg/dL and 1-2 hours after meal goal of less than 180 mg/dL. Will follow up with telephone call in 2 weeks. Begin working towards heart healthy diet such as the DASH diet. See handout attached.  . Reduce medication cost: Apply for low income subsidy. Pharmacist will print and mail the application to you.  . Vaccinations: Recommend tetanus vaccine (Tdap) and shingles vaccine, 2-dose series (Shingrix).   Interventions: . Comprehensive medication review performed . Reviewed diabetes goals and potential medication options . Coordinate pharmacy coordination and delivery services  Patient Self Care Activities:  . Self administers medications as prescribed . Self-monitors blood glucose  Initial goal documentation      Hypertension   CMP Latest Ref Rng & Units 12/04/2018 07/16/2018 12/11/2017    Glucose 70 - 99 mg/dL 335(H) 256(H) 215(H)  BUN 6 - 23 mg/dL 19 24(H) 15  Creatinine 0.40 - 1.20 mg/dL 1.19 1.36(H) 1.22(H)  Sodium 135 - 145 mEq/L 139 140 140  Potassium 3.5 - 5.1 mEq/L 4.7 4.8 4.9  Chloride 96 - 112 mEq/L 104 104 102  CO2 19 - 32 mEq/L _0 Calcium 8.4 - 10.5 mg/dL 9.2 9.5 9.4  Total Protein 6.0 - 8.3 g/dL - 6.8 -  Total Bilirubin 0.2 - 1.2 mg/dL - 0.5 -  Alkaline Phos 39 - 117 U/L - 87 -  AST 0 - 37 U/L - 18 -  ALT 0 - 35 U/L - 16 -   Office blood pressures are: BP Readings from Last 3 Encounters:  04/08/19 126/75  03/15/19 140/72  03/12/19 (!) 146/69   BP < 140/90 mmHg Patient has failed these meds in the past: none reported Patient checks BP at home: has home monitor but doesn't use it much, checks [facility] Patient home BP readings are ranging: 138/76 mmHg  Patient is currently controlled on the following medications:   No pharmacotherapy  We discussed: history of orthostatic hypotension    Plan: Continue current medications  Hyperlipidemia/CAD   Lipid Panel  Component Value Date/Time   CHOL 93 12/04/2018 0813   CHOL 106 10/17/2017 0927   TRIG 190.0 (H) 12/04/2018 0813   HDL 32.10 (L) 12/04/2018 0813   HDL 32 (L) 10/17/2017 0927   CHOLHDL 3 12/04/2018 0813   VLDL 38.0 12/04/2018 0813   LDLCALC 22 12/04/2018 0813   LDLCALC 44 10/17/2017 0927   LDLDIRECT 134.9 10/21/2011 0939   LABVLDL 30 10/17/2017 0927    CBC Latest Ref Rng & Units 12/04/2018 12/02/2017 11/23/2017  WBC 4.0 - 10.5 K/uL 6.9 8.1 7.9  Hemoglobin 12.0 - 15.0 g/dL 12.2 12.8 12.0  Hematocrit 36.0 - 46.0 % 37.5 38.0 36.8  Platelets 150.0 - 400.0 K/uL 213.0 256.0 239   LDL goal < 70 (CAD/CVA) Patient has failed these meds in past: none reported Patient is currently controlled on the following medications:   Aspirin 81 mg - 1 tablet daily  Plavix 75 mg - 1 tablet daily  Atorvastatin 40 mg - 1 tablet daily (PM)  Isosorbide mononitrate 30 mg - 1/2 tablet  daily  We discussed: confirmed adherence, CBC stable  Plan: Continue current medications  Diabetes   Recent Relevant Labs: Lab Results  Component Value Date/Time   HGBA1C 11.0 (A) 03/15/2019 03:39 PM   HGBA1C 9.0 (H) 12/04/2018 08:13 AM   HGBA1C 8.8 (H) 07/16/2018 08:49 AM   MICROALBUR <0.7 07/16/2018 11:20 AM   MICROALBUR <0.7 11/26/2017 06:05 PM    A1c goal < 7% Checking BG: Weekly Recent FBG Readings: 70-80, up to 130s Hypoglycemia: denies < 70  Diet: eats one meal a day   Lunch: mostly fast foods, salad, sandwich from [employer], pasta  Snacks: potato chips  Drinks: coke, green tea with honey  Patient has failed these meds in past: glimepiride  Patient is currently uncontrolled on the following medications:   Metformin 500 mg - 1 tablet TID before meals   Glipizide 5 mg - 1 tablet BID  Last diabetic eye exam:  Lab Results  Component Value Date/Time   HMDIABEYEEXA Retinopathy (A) 05/10/2018 12:00 AM    Last diabetic foot exam: due for annual foot exam   We discussed: confirmed adherence, prefers to avoid insulin/injections  Plan: Continue current medications; Recommend checking blood glucose twice daily for the next 2 weeks. Mail LIS form to reduce cost of medications.   Heart Failure   Type: Diastolic Last ejection fraction: 10/2017 55-65% NYHA Class: II (slight limitation of activity) AHA HF Stage: C (Heart disease and symptoms present)  Patient has failed these meds in past: none  Patient is currently controlled on the following medications:   Metoprolol tartrate 25 mg - 1/2 tablet daily  Furosemide 20 mg - 1 tablet daily   Symptoms: some shortness of breath with walking long distances, denies swelling  Exercise: walks frequently We discussed: does not weigh daily, checks legs for swelling   Plan: Continue current medications   Diabetic Neuropathy    Patient has failed these meds in past: none Patient is currently controlled on the following  medications:   Gabapentin 100 mg - 1 tablet BID and 2 tablets at bedtime  We discussed: reports working well for pain, helps with sleep, some drowsiness with doses during the day, but tolerable  Plan: Continue current medications   GERD   Patient has failed these meds in past: none Patient is currently controlled on the following medications:   Pantoprazole 20 mg - 1 tablet daily  We discussed: denies any symptoms  Plan: Continue current medications  Insomnia   Patient has failed these meds in past: none Patient is currently controlled on the following medications:   Trazodone 50 mg - 1 and 1/2 tablets qHs   We discussed: takes very sparingly   Plan: Continue current medications   Depression   Patient has failed these meds in past: Cymbalta - denies issues  Patient is currently controlled on the following medications:   No pharmacotherapy   We discussed: reports doing well without medication  Plan: Continue current medications   Vaccines   Reviewed and discussed patient's vaccination history.    Immunization History  Administered Date(s) Administered  . Fluad Quad(high Dose 65+) 12/11/2018  . Influenza Split 12/17/2011  . Influenza Whole 01/22/2013  . Influenza,inj,Quad PF,6+ Mos 11/10/2015, 12/26/2016, 11/26/2017  . Pneumococcal Conjugate-13 12/26/2016  . Pneumococcal-Unspecified 11/30/2010  . Td 08/10/2003   Plan: Recommended patient receive Shingrix and Tdap.   Medication Management  Misc:  doxycycline 100 mg - completed course, latanoprost 0.005% - 1 drop in each eye at bedtime, metoclopramide 10 mg prn n/v, albuterol inhaler (PRN - not often)    OTCs: Tylenol PRN, B12 1000 mcg - 1 every morning, docusate 100 mg daily (not taking)  Pharmacy/Benefits: [employer]), would like to switch to [facility] delivery  Affordability: all are free except eye drops - 3.70, gabapentin - 3.70   Financial Resource Strain: Medium Risk  . Difficulty of  Paying Living Expenses: Somewhat hard  --> Applying for low income subsidy   Verbal consent obtained for [facility] enhanced pharmacy services (medication synchronization, adherence packaging, delivery coordination). A medication sync plan was created to allow patient to get all medications delivered once every 30 to 90 days per patient preference. Patient understands they have freedom to choose pharmacy and clinical pharmacist will coordinate care between all prescribers and [facility].  CCM Follow Up:  07/06/19 at 2:45 PM (telephone)  [name], PharmD Clinical Pharmacist [facility] at [facility] [phone]

## 2019-06-22 ENCOUNTER — Other Ambulatory Visit: Payer: Self-pay

## 2019-06-22 DIAGNOSIS — E785 Hyperlipidemia, unspecified: Secondary | ICD-10-CM

## 2019-06-22 MED ORDER — GLIPIZIDE 5 MG PO TABS: 5.0000 mg | ORAL_TABLET | Freq: Two times a day (BID) | ORAL | 1 refills | Status: DC

## 2019-06-22 NOTE — Telephone Encounter (Signed)
Maria Fox is [facility]. She would like a refill on her gabapentin today. Preferred pharmacy updated in chart.   Thank you,  [name], PharmD Clinical Pharmacist [facility] at [facility] [phone]

## 2019-06-22 NOTE — Telephone Encounter (Signed)
Plavix Last rx:  04/01/19, #90

## 2019-06-22 NOTE — Telephone Encounter (Signed)
Additional request for glipizide and clopidogrel refills, thanks!

## 2019-06-22 NOTE — Patient Instructions (Addendum)
June 21, 2019  Dear Maria Fox,  It was a pleasure meeting you during our initial appointment on June 21, 2019. Below is a summary of the goals we discussed and components of chronic care management. Please contact me anytime with questions or concerns.   Visit Information  Goals    . Increase physical activity     Starting 12/26/2016, I will continue to walk for 30 min daily.     . Patient Stated     12/08/2018, Patient wants to maintain and continue medications as prescribed.     . Pharmacy Care Plan     CARE PLAN ENTRY  Current Barriers:  . Chronic Disease Management support, education, and care coordination needs related to hypertension, chronic heart failure, coronary artery disease, fatty liver disease, GERD, hyperlipidemia, diabetes, chronic kidney disease, osteopenia, depressive disorder, insomnia, iron deficiency, B12 deficiency   Pharmacist Clinical Goal(s):  Marland Kitchen Over the next 2 weeks, patient will work with PharmD and primary care provider to address the following goals: Marland Kitchen Diabetes: Patient will begin monitoring blood glucose twice daily before breakfast and 1-2 hours after meal. Fasting blood glucose goal of 80-130 mg/dL and 1-2 hours after meal goal of less than 180 mg/dL. Will follow up with telephone call in 2 weeks. Begin working towards heart healthy diet such as the DASH diet. See handout attached.  . Reduce medication cost: Apply for low income subsidy. Pharmacist will print and mail the application to you.  . Vaccinations: Recommend tetanus vaccine (Tdap) and shingles vaccine, 2-dose series (Shingrix).   Interventions: . Comprehensive medication review performed . Reviewed diabetes goals and potential medication options . Coordinate pharmacy coordination and delivery services  Patient Self Care Activities:  . Self administers medications as prescribed . Self-monitors blood glucose  Initial goal documentation      Maria Fox was given information about  Chronic Care Management services today including:  1. CCM service includes personalized support from designated clinical staff supervised by her physician, including individualized plan of care and coordination with other care providers 2. 24/7 contact phone numbers for assistance for urgent and routine care needs. 3. Standard insurance, coinsurance, copays and deductibles apply for chronic care management only during months in which we provide at least 20 minutes of these services. Most insurances cover these services at 100%, however patients may be responsible for any copay, coinsurance and/or deductible if applicable. This service may help you avoid the need for more expensive face-to-face services. 4. Only one practitioner may furnish and bill the service in a calendar month. 5. The patient may stop CCM services at any time (effective at the end of the month) by phone call to the office staff.  Patient agreed to services and verbal consent obtained.   Verbal consent obtained for [facility] enhanced pharmacy services (medication synchronization, adherence packaging, delivery coordination). A medication sync plan was created to allow patient to get all medications delivered once every 30 to 90 days per patient preference. Patient understands they have freedom to choose pharmacy and clinical pharmacist will coordinate care between all prescribers and [facility].  The patient verbalized understanding of instructions provided today and agreed to receive a mailed copy of patient instruction and/or educational materials. Telephone follow up appointment with pharmacy team member scheduled for: 07/06/19 at 2:45 PM (telephone)  [name], PharmD Clinical Pharmacist [facility] [phone]  [place] stands for "Dietary Approaches to Stop Hypertension." The DASH eating plan is a  healthy eating plan that has been shown to reduce high blood pressure  (hypertension). It may also reduce your risk for type 2 diabetes, heart disease, and stroke. The DASH eating plan may also help with weight loss. What are tips for following this plan?  General guidelines  Avoid eating more than 2,300 mg (milligrams) of salt (sodium) a day. If you have hypertension, you may need to reduce your sodium intake to 1,500 mg a day.  Limit alcohol intake to no more than 1 drink a day for nonpregnant women and 2 drinks a day for men. One drink equals 12 oz of beer, 5 oz of wine, or 1 oz of hard liquor.  Work with your health care provider to maintain a healthy body weight or to lose weight. Ask what an ideal weight is for you.  Get at least 30 minutes of exercise that causes your heart to beat faster (aerobic exercise) most days of the week. Activities may include walking, swimming, or biking.  Work with your health care provider or diet and nutrition specialist (dietitian) to adjust your eating plan to your individual calorie needs. Reading food labels   Check food labels for the amount of sodium per serving. Choose foods with less than 5 percent of the Daily Value of sodium. Generally, foods with less than 300 mg of sodium per serving fit into this eating plan.  To find whole grains, look for the word "whole" as the first word in the ingredient list. Shopping  Buy products labeled as "low-sodium" or "no salt added."  Buy fresh foods. Avoid canned foods and premade or frozen meals. Cooking  Avoid adding salt when cooking. Use salt-free seasonings or herbs instead of table salt or sea salt. Check with your health care provider or pharmacist before using salt substitutes.  Do not fry foods. Cook foods using healthy methods such as baking, boiling, grilling, and broiling instead.  Cook with heart-healthy oils, such as olive, canola, soybean, or sunflower oil. Meal planning  Eat a balanced diet that includes: ? 5 or more servings of fruits and vegetables  each day. At each meal, try to fill half of your plate with fruits and vegetables. ? Up to 6-8 servings of whole grains each day. ? Less than 6 oz of lean meat, poultry, or fish each day. A 3-oz serving of meat is about the same size as a deck of cards. One egg equals 1 oz. ? 2 servings of low-fat dairy each day. ? A serving of nuts, seeds, or beans 5 times each week. ? Heart-healthy fats. Healthy fats called Omega-3 fatty acids are found in foods such as flaxseeds and coldwater fish, like sardines, salmon, and mackerel.  Limit how much you eat of the following: ? Canned or prepackaged foods. ? Food that is high in trans fat, such as fried foods. ? Food that is high in saturated fat, such as fatty meat. ? Sweets, desserts, sugary drinks, and other foods with added sugar. ? Full-fat dairy products.  Do not salt foods before eating.  Try to eat at least 2 vegetarian meals each week.  Eat more home-cooked food and less restaurant, buffet, and fast food.  When eating at a restaurant, ask that your food be prepared with less salt or no salt, if possible. What foods are recommended? The items listed may not be a complete list. Talk with your dietitian about what dietary choices are best for you. Grains Whole-grain or whole-wheat bread. Whole-grain or whole-wheat pasta.  Brown rice. [place]. Bulgur. Whole-grain and low-sodium cereals. Pita bread. Low-fat, low-sodium crackers. Whole-wheat flour tortillas. Vegetables Fresh or frozen vegetables (raw, steamed, roasted, or grilled). Low-sodium or reduced-sodium tomato and vegetable juice. Low-sodium or reduced-sodium tomato sauce and tomato paste. Low-sodium or reduced-sodium canned vegetables. Fruits All fresh, dried, or frozen fruit. Canned fruit in natural juice (without added sugar). Meat and other protein foods Skinless chicken or [place]. Ground chicken or [place]. Pork with fat trimmed off. Fish and seafood. Egg whites. Dried beans,  peas, or lentils. Unsalted nuts, nut butters, and seeds. Unsalted canned beans. Lean cuts of beef with fat trimmed off. Low-sodium, lean deli meat. Dairy Low-fat (1%) or fat-free (skim) milk. Fat-free, low-fat, or reduced-fat cheeses. Nonfat, low-sodium ricotta or cottage cheese. Low-fat or nonfat yogurt. Low-fat, low-sodium cheese. Fats and oils Soft margarine without trans fats. Vegetable oil. Low-fat, reduced-fat, or light mayonnaise and salad dressings (reduced-sodium). Canola, safflower, olive, soybean, and sunflower oils. Avocado. Seasoning and other foods Herbs. Spices. Seasoning mixes without salt. Unsalted popcorn and pretzels. Fat-free sweets. What foods are not recommended? The items listed may not be a complete list. Talk with your dietitian about what dietary choices are best for you. Grains Baked goods made with fat, such as croissants, muffins, or some breads. Dry pasta or rice meal packs. Vegetables Creamed or fried vegetables. Vegetables in a cheese sauce. Regular canned vegetables (not low-sodium or reduced-sodium). Regular canned tomato sauce and paste (not low-sodium or reduced-sodium). Regular tomato and vegetable juice (not low-sodium or reduced-sodium). Angie Fava. Olives. Fruits Canned fruit in a light or heavy syrup. Fried fruit. Fruit in cream or butter sauce. Meat and other protein foods Fatty cuts of meat. Ribs. Fried meat. Berniece Salines. Sausage. Bologna and other processed lunch meats. Salami. Fatback. Hotdogs. Bratwurst. Salted nuts and seeds. Canned beans with added salt. Canned or smoked fish. Whole eggs or egg yolks. Chicken or [place] with skin. Dairy Whole or 2% milk, cream, and half-and-half. Whole or full-fat cream cheese. Whole-fat or sweetened yogurt. Full-fat cheese. Nondairy creamers. Whipped toppings. Processed cheese and cheese spreads. Fats and oils Butter. Stick margarine. Lard. Shortening. Ghee. Bacon fat. Tropical oils, such as coconut, palm kernel, or palm  oil. Seasoning and other foods Salted popcorn and pretzels. Onion salt, garlic salt, seasoned salt, table salt, and sea salt. Worcestershire sauce. Tartar sauce. Barbecue sauce. Teriyaki sauce. Soy sauce, including reduced-sodium. Steak sauce. Canned and packaged gravies. Fish sauce. Oyster sauce. Cocktail sauce. Horseradish that you find on the shelf. Ketchup. Mustard. Meat flavorings and tenderizers. Bouillon cubes. Hot sauce and Tabasco sauce. Premade or packaged marinades. Premade or packaged taco seasonings. Relishes. Regular salad dressings. Where to find more information:  National Heart, Lung, and [place]: https://wilson-eaton.com/  American Heart Association: www.heart.org Summary  The DASH eating plan is a healthy eating plan that has been shown to reduce high blood pressure (hypertension). It may also reduce your risk for type 2 diabetes, heart disease, and stroke.  With the DASH eating plan, you should limit salt (sodium) intake to 2,300 mg a day. If you have hypertension, you may need to reduce your sodium intake to 1,500 mg a day.  When on the DASH eating plan, aim to eat more fresh fruits and vegetables, whole grains, lean proteins, low-fat dairy, and heart-healthy fats.  Work with your health care provider or diet and nutrition specialist (dietitian) to adjust your eating plan to your individual calorie needs. This information is not intended to replace advice given to you by your  health care provider. Make sure you discuss any questions you have with your health care provider. Document Revised: 02/07/2017 Document Reviewed: 02/19/2016 Elsevier Patient Education  2020 [employer].

## 2019-06-22 NOTE — Telephone Encounter (Signed)
Gabapentin Last rx:  04/19/19, #450 Last OV:  03/15/19, 3-4 mo f/u Next OV:  none

## 2019-06-23 ENCOUNTER — Telehealth: Payer: Self-pay

## 2019-06-23 NOTE — Telephone Encounter (Signed)
Per Sharyn Lull, pharmacist, pt's ins co is denying gabapentin due to pt taking more than 3 caps daily.  A PA has been requested.   I will need dx for the gabapentin.

## 2019-06-24 MED ORDER — GABAPENTIN 100 MG PO CAPS: 100.0000 mg | ORAL_CAPSULE | Freq: Three times a day (TID) | ORAL | 1 refills | Status: DC

## 2019-06-24 NOTE — Telephone Encounter (Addendum)
I reviewed chart. Don't see clear indication for gabapentin. plz call pt to see what she is taking this for - may have started for periph neuropathy that since has been well controlled or for post-shingles pain remotely that should have since resolved.  Would like to decrease dose to 100mg  TID to see how she does on lower dose - lower dose sent to pharmacy.

## 2019-06-24 NOTE — Addendum Note (Signed)
Addended by: [name] on: 06/24/2019 03:19 PM   Modules accepted: Orders

## 2019-06-24 NOTE — Telephone Encounter (Signed)
Lvm asking pt to call back.  Need to relay Dr. G's message.  

## 2019-06-25 MED ORDER — CLOPIDOGREL BISULFATE 75 MG PO TABS: 75.0000 mg | ORAL_TABLET | Freq: Every day | ORAL | 1 refills | Status: DC

## 2019-06-25 NOTE — Telephone Encounter (Signed)
Noted  

## 2019-06-25 NOTE — Telephone Encounter (Signed)
Lvm asking pt to call back.  Need to relay Dr. G's message.  

## 2019-06-25 NOTE — Telephone Encounter (Addendum)
Pt aware of dosing of Gabapentin and this has been sent to [facility].   Will send to [facility] to make her aware as well as Lattie Haw.   Nothing further needed.

## 2019-07-06 ENCOUNTER — Other Ambulatory Visit: Payer: Self-pay

## 2019-07-06 ENCOUNTER — Inpatient Hospital Stay (HOSPITAL_COMMUNITY)
Admission: EM | Admit: 2019-07-06 | Discharge: 2019-07-23 | DRG: 417 | Disposition: A | Discharge status: Home / Self Care | Payer: Medicare HMO | Attending: Internal Medicine | Admitting: Internal Medicine

## 2019-07-06 ENCOUNTER — Telehealth: Payer: Self-pay | Admitting: Family Medicine

## 2019-07-06 ENCOUNTER — Telehealth: Payer: Medicare HMO

## 2019-07-06 ENCOUNTER — Encounter (HOSPITAL_COMMUNITY): Payer: Self-pay | Admitting: Emergency Medicine

## 2019-07-06 DIAGNOSIS — K5730 Diverticulosis of large intestine without perforation or abscess without bleeding: Secondary | ICD-10-CM | POA: Diagnosis present

## 2019-07-06 DIAGNOSIS — I82442 Acute embolism and thrombosis of left tibial vein: Secondary | ICD-10-CM | POA: Diagnosis not present

## 2019-07-06 DIAGNOSIS — E785 Hyperlipidemia, unspecified: Secondary | ICD-10-CM | POA: Diagnosis present

## 2019-07-06 DIAGNOSIS — I25810 Atherosclerosis of coronary artery bypass graft(s) without angina pectoris: Secondary | ICD-10-CM | POA: Diagnosis present

## 2019-07-06 DIAGNOSIS — K760 Fatty (change of) liver, not elsewhere classified: Secondary | ICD-10-CM | POA: Diagnosis present

## 2019-07-06 DIAGNOSIS — Z825 Family history of asthma and other chronic lower respiratory diseases: Secondary | ICD-10-CM

## 2019-07-06 DIAGNOSIS — R0600 Dyspnea, unspecified: Secondary | ICD-10-CM

## 2019-07-06 DIAGNOSIS — I252 Old myocardial infarction: Secondary | ICD-10-CM

## 2019-07-06 DIAGNOSIS — G43901 Migraine, unspecified, not intractable, with status migrainosus: Secondary | ICD-10-CM | POA: Diagnosis present

## 2019-07-06 DIAGNOSIS — Z7982 Long term (current) use of aspirin: Secondary | ICD-10-CM

## 2019-07-06 DIAGNOSIS — J9601 Acute respiratory failure with hypoxia: Secondary | ICD-10-CM | POA: Diagnosis not present

## 2019-07-06 DIAGNOSIS — Z7902 Long term (current) use of antithrombotics/antiplatelets: Secondary | ICD-10-CM

## 2019-07-06 DIAGNOSIS — K567 Ileus, unspecified: Secondary | ICD-10-CM

## 2019-07-06 DIAGNOSIS — R002 Palpitations: Secondary | ICD-10-CM | POA: Diagnosis not present

## 2019-07-06 DIAGNOSIS — E1149 Type 2 diabetes mellitus with other diabetic neurological complication: Secondary | ICD-10-CM | POA: Diagnosis present

## 2019-07-06 DIAGNOSIS — E1169 Type 2 diabetes mellitus with other specified complication: Secondary | ICD-10-CM | POA: Diagnosis present

## 2019-07-06 DIAGNOSIS — I25118 Atherosclerotic heart disease of native coronary artery with other forms of angina pectoris: Secondary | ICD-10-CM | POA: Diagnosis present

## 2019-07-06 DIAGNOSIS — Z83438 Family history of other disorder of lipoprotein metabolism and other lipidemia: Secondary | ICD-10-CM

## 2019-07-06 DIAGNOSIS — E1122 Type 2 diabetes mellitus with diabetic chronic kidney disease: Secondary | ICD-10-CM | POA: Diagnosis present

## 2019-07-06 DIAGNOSIS — Z8673 Personal history of transient ischemic attack (TIA), and cerebral infarction without residual deficits: Secondary | ICD-10-CM

## 2019-07-06 DIAGNOSIS — Z833 Family history of diabetes mellitus: Secondary | ICD-10-CM

## 2019-07-06 DIAGNOSIS — Z79899 Other long term (current) drug therapy: Secondary | ICD-10-CM

## 2019-07-06 DIAGNOSIS — K8000 Calculus of gallbladder with acute cholecystitis without obstruction: Principal | ICD-10-CM | POA: Diagnosis present

## 2019-07-06 DIAGNOSIS — J439 Emphysema, unspecified: Secondary | ICD-10-CM | POA: Diagnosis present

## 2019-07-06 DIAGNOSIS — Z8249 Family history of ischemic heart disease and other diseases of the circulatory system: Secondary | ICD-10-CM

## 2019-07-06 DIAGNOSIS — E875 Hyperkalemia: Secondary | ICD-10-CM | POA: Diagnosis present

## 2019-07-06 DIAGNOSIS — E1165 Type 2 diabetes mellitus with hyperglycemia: Secondary | ICD-10-CM | POA: Diagnosis present

## 2019-07-06 DIAGNOSIS — I2510 Atherosclerotic heart disease of native coronary artery without angina pectoris: Secondary | ICD-10-CM

## 2019-07-06 DIAGNOSIS — Z91048 Other nonmedicinal substance allergy status: Secondary | ICD-10-CM

## 2019-07-06 DIAGNOSIS — K810 Acute cholecystitis: Secondary | ICD-10-CM | POA: Diagnosis present

## 2019-07-06 DIAGNOSIS — Z7984 Long term (current) use of oral hypoglycemic drugs: Secondary | ICD-10-CM

## 2019-07-06 DIAGNOSIS — I5032 Chronic diastolic (congestive) heart failure: Secondary | ICD-10-CM | POA: Diagnosis present

## 2019-07-06 DIAGNOSIS — I10 Essential (primary) hypertension: Secondary | ICD-10-CM | POA: Diagnosis present

## 2019-07-06 DIAGNOSIS — I471 Supraventricular tachycardia: Secondary | ICD-10-CM | POA: Diagnosis present

## 2019-07-06 DIAGNOSIS — R1031 Right lower quadrant pain: Secondary | ICD-10-CM | POA: Diagnosis not present

## 2019-07-06 DIAGNOSIS — Z20822 Contact with and (suspected) exposure to covid-19: Secondary | ICD-10-CM | POA: Diagnosis present

## 2019-07-06 DIAGNOSIS — Z91041 Radiographic dye allergy status: Secondary | ICD-10-CM

## 2019-07-06 DIAGNOSIS — I2699 Other pulmonary embolism without acute cor pulmonale: Secondary | ICD-10-CM | POA: Diagnosis present

## 2019-07-06 DIAGNOSIS — R739 Hyperglycemia, unspecified: Secondary | ICD-10-CM

## 2019-07-06 DIAGNOSIS — Z881 Allergy status to other antibiotic agents status: Secondary | ICD-10-CM

## 2019-07-06 DIAGNOSIS — K819 Cholecystitis, unspecified: Secondary | ICD-10-CM

## 2019-07-06 DIAGNOSIS — Z91030 Bee allergy status: Secondary | ICD-10-CM

## 2019-07-06 DIAGNOSIS — K828 Other specified diseases of gallbladder: Secondary | ICD-10-CM | POA: Diagnosis present

## 2019-07-06 DIAGNOSIS — I4892 Unspecified atrial flutter: Secondary | ICD-10-CM

## 2019-07-06 DIAGNOSIS — N183 Chronic kidney disease, stage 3 unspecified: Secondary | ICD-10-CM | POA: Diagnosis present

## 2019-07-06 DIAGNOSIS — G4700 Insomnia, unspecified: Secondary | ICD-10-CM | POA: Diagnosis present

## 2019-07-06 DIAGNOSIS — L80 Vitiligo: Secondary | ICD-10-CM | POA: Diagnosis present

## 2019-07-06 DIAGNOSIS — I2583 Coronary atherosclerosis due to lipid rich plaque: Secondary | ICD-10-CM | POA: Diagnosis present

## 2019-07-06 DIAGNOSIS — I130 Hypertensive heart and chronic kidney disease with heart failure and stage 1 through stage 4 chronic kidney disease, or unspecified chronic kidney disease: Secondary | ICD-10-CM | POA: Diagnosis present

## 2019-07-06 DIAGNOSIS — Z885 Allergy status to narcotic agent status: Secondary | ICD-10-CM

## 2019-07-06 DIAGNOSIS — N179 Acute kidney failure, unspecified: Secondary | ICD-10-CM | POA: Diagnosis not present

## 2019-07-06 DIAGNOSIS — Z87440 Personal history of urinary (tract) infections: Secondary | ICD-10-CM

## 2019-07-06 DIAGNOSIS — E1142 Type 2 diabetes mellitus with diabetic polyneuropathy: Secondary | ICD-10-CM | POA: Diagnosis present

## 2019-07-06 DIAGNOSIS — Z955 Presence of coronary angioplasty implant and graft: Secondary | ICD-10-CM

## 2019-07-06 DIAGNOSIS — Z86010 Personal history of colonic polyps: Secondary | ICD-10-CM

## 2019-07-06 DIAGNOSIS — Z8781 Personal history of (healed) traumatic fracture: Secondary | ICD-10-CM

## 2019-07-06 DIAGNOSIS — Z880 Allergy status to penicillin: Secondary | ICD-10-CM

## 2019-07-06 DIAGNOSIS — N1831 Chronic kidney disease, stage 3a: Secondary | ICD-10-CM | POA: Diagnosis present

## 2019-07-06 DIAGNOSIS — N390 Urinary tract infection, site not specified: Secondary | ICD-10-CM | POA: Diagnosis present

## 2019-07-06 LAB — URINALYSIS, ROUTINE W REFLEX MICROSCOPIC
Bilirubin Urine: NEGATIVE
Glucose, UA: >=500 mg/dL — AB
Hgb urine dipstick: NEGATIVE
Ketones, ur: NEGATIVE mg/dL
Nitrite: NEGATIVE
Protein, ur: NEGATIVE mg/dL
Specific Gravity, Urine: 1.024 (ref 1.005–1.030)
WBC, UA: >50 WBC/hpf — ABNORMAL HIGH (ref 0–5)
pH: 5 (ref 5.0–8.0)

## 2019-07-06 LAB — CBC
HCT: 41.2 % (ref 36.0–46.0)
Hemoglobin: 13.8 g/dL (ref 12.0–15.0)
MCH: 30.7 pg (ref 26.0–34.0)
MCHC: 33.5 g/dL (ref 30.0–36.0)
MCV: 91.8 fL (ref 80.0–100.0)
Platelets: 226 10*3/uL (ref 150–400)
RBC: 4.49 MIL/uL (ref 3.87–5.11)
RDW: 13 % (ref 11.5–15.5)
WBC: 6.3 10*3/uL (ref 4.0–10.5)
nRBC: 0 % (ref 0.0–0.2)

## 2019-07-06 LAB — COMPREHENSIVE METABOLIC PANEL
ALT: 23 U/L (ref 0–44)
AST: 20 U/L (ref 15–41)
Albumin: 3.6 g/dL (ref 3.5–5.0)
Alkaline Phosphatase: 107 U/L (ref 38–126)
Anion gap: 11 (ref 5–15)
BUN: 11 mg/dL (ref 8–23)
CO2: 21 mmol/L — ABNORMAL LOW (ref 22–32)
Calcium: 9.1 mg/dL (ref 8.9–10.3)
Chloride: 102 mmol/L (ref 98–111)
Creatinine, Ser: 1.11 mg/dL — ABNORMAL HIGH (ref 0.44–1.00)
GFR calc Af Amer: 56 mL/min — ABNORMAL LOW (ref >60)
GFR calc non Af Amer: 48 mL/min — ABNORMAL LOW (ref >60)
Glucose, Bld: 301 mg/dL — ABNORMAL HIGH (ref 70–99)
Potassium: 4.1 mmol/L (ref 3.5–5.1)
Sodium: 134 mmol/L — ABNORMAL LOW (ref 135–145)
Total Bilirubin: 0.6 mg/dL (ref 0.3–1.2)
Total Protein: 6.5 g/dL (ref 6.5–8.1)

## 2019-07-06 LAB — LIPASE, BLOOD: Lipase: 29 U/L (ref 11–51)

## 2019-07-06 NOTE — Chronic Care Management (AMB) (Deleted)
Chronic Care Management Pharmacy  Name: Maria Fox  MRN: [id number] DOB: January 23, 1946  Chief Complaint/ HPI  Maria Fox,  74 y.o., female presents for their Initial CCM visit with the clinical pharmacist via telephone.  PCP : [name], MD  Their chronic conditions include: hypertension, chronic heart failure, coronary artery disease, fatty liver disease, GERD, hyperlipidemia, diabetes, chronic kidney disease, osteopenia, depressive disorder, insomnia, iron deficiency, B12 deficiency   Patient concerns: reports colonoscopy and endoscopy were clear, nausea has improved; denies medication changes in past 6 months   Thinks she may need to go to ED - diarrhea for several days, unable to eat for 6 days, burps a lot/gas when drinking, now constipated, lower right side - painful; drinks water/iced tea; stool softener night before and has BM this morning    Office Visits:  03/15/19: [name] - possible mastitis, doxycyline course, schedule mammogram, diabetes uncontrolled, review SGLT2 GLP1 at next visit, affordability remains a concern, rtc 2 weeks  12/11/19: [name] - restart Cymbalta 20 mg daily, change glimepiride to glipizide 5 mg BID, check with insurance for preferred glucose meter, rtc 4 months  Consult Visit:  12/24/18: Cardiology - cont current meds  Allergies  Allergen Reactions  . Mushroom Extract Complex Anaphylaxis  . Codeine Nausea And Vomiting  . Penicillins Hives and Swelling    Has patient had a PCN reaction causing immediate rash, facial/tongue/throat swelling, SOB or lightheadedness with hypotension: Yes Has patient had a PCN reaction causing severe rash involving mucus membranes or skin necrosis: Yes Has patient had a PCN reaction that required hospitalization Yes Has patient had a PCN reaction occurring within the last 10 years: No If all of the above answers are "NO", then may proceed with Cephalosporin use.   . Sulfa Antibiotics Nausea And Vomiting   . Bee Venom     Throat swelling  . Erythromycin Base Rash   Medications: Outpatient Encounter Medications as of 07/06/2019  Medication Sig  . acetaminophen (TYLENOL) 500 MG tablet Take 500 mg by mouth as needed.  Marland Kitchen albuterol (PROAIR HFA) 108 (90 Base) MCG/ACT inhaler Inhale 2 puffs into the lungs every 6 (six) hours as needed for wheezing or shortness of breath.  Marland Kitchen aspirin EC 81 MG tablet Take 81 mg by mouth daily.   Marland Kitchen atorvastatin (LIPITOR) 40 MG tablet TAKE 1 TABLET BY MOUTH EVERYDAY AT BEDTIME  . Blood Glucose Monitoring Suppl (ONE TOUCH ULTRA 2) w/Device KIT Use as instructed to check blood sugar daily.  . clopidogrel (PLAVIX) 75 MG tablet Take 1 tablet (75 mg total) by mouth daily.  . Cyanocobalamin (B-12) 1000 MCG SUBL Place 1 tablet under the tongue daily.  Marland Kitchen docusate sodium (COLACE) 100 MG capsule Take 1 capsule (100 mg total) by mouth daily. (Patient not taking: Reported on 04/08/2019)  . doxycycline (VIBRAMYCIN) 100 MG capsule Take 1 capsule (100 mg total) by mouth 2 (two) times daily. (Patient not taking: Reported on 04/08/2019)  . furosemide (LASIX) 20 MG tablet TAKE 1 TABLET BY MOUTH EVERY DAY  . gabapentin (NEURONTIN) 100 MG capsule Take 1 capsule (100 mg total) by mouth 3 (three) times daily.  Marland Kitchen glipiZIDE (GLUCOTROL) 5 MG tablet Take 1 tablet (5 mg total) by mouth 2 (two) times daily.  . isosorbide mononitrate (IMDUR) 30 MG 24 hr tablet Take 0.5 tablets (15 mg total) by mouth daily.  Marland Kitchen latanoprost (XALATAN) 0.005 % ophthalmic solution   . metFORMIN (GLUCOPHAGE) 500 MG tablet Take 1 tablet (500 mg total) by  mouth 3 (three) times daily before meals.  . metoCLOPramide (REGLAN) 10 MG tablet Take 1 tablet (10 mg total) by mouth every 6 (six) hours as needed for nausea or vomiting.  . metoprolol tartrate (LOPRESSOR) 25 MG tablet TAKE 1/2 TABLET BY MOUTH DAILY  . NON FORMULARY Antifungal cream from [place] as needed  . OneTouch Delica Lancets 99M MISC Use as directed to  check blood sugar daily  . ONETOUCH ULTRA test strip USE AS INSTRUCTED TO CHECK BLOOD SUGAR DAILY  . pantoprazole (PROTONIX) 20 MG tablet TAKE 1 TABLET BY MOUTH EVERY DAY  . traZODone (DESYREL) 50 MG tablet Take 1.5 tablets (75 mg total) by mouth at bedtime as needed for sleep.   No facility-administered encounter medications on file as of 07/06/2019.   Current Diagnosis/Assessment: Goals    . Increase physical activity     Starting 12/26/2016, I will continue to walk for 30 min daily.     . Patient Stated     12/08/2018, Patient wants to maintain and continue medications as prescribed.     . Pharmacy Care Plan     CARE PLAN ENTRY  Current Barriers:  . Chronic Disease Management support, education, and care coordination needs related to hypertension, chronic heart failure, coronary artery disease, fatty liver disease, GERD, hyperlipidemia, diabetes, chronic kidney disease, osteopenia, depressive disorder, insomnia, iron deficiency, B12 deficiency   Pharmacist Clinical Goal(s):  Marland Kitchen Over the next 2 weeks, patient will work with PharmD and primary care provider to address the following goals: Marland Kitchen Diabetes: Patient will begin monitoring blood glucose twice daily before breakfast and 1-2 hours after meal. Fasting blood glucose goal of 80-130 mg/dL and 1-2 hours after meal goal of less than 180 mg/dL. Will follow up with telephone call in 2 weeks. Begin working towards heart healthy diet such as the DASH diet. See handout attached.  . Reduce medication cost: Apply for low income subsidy. Pharmacist will print and mail the application to you.  . Vaccinations: Recommend tetanus vaccine (Tdap) and shingles vaccine, 2-dose series (Shingrix).   Interventions: . Comprehensive medication review performed . Reviewed diabetes goals and potential medication options . Coordinate pharmacy coordination and delivery services  Patient Self Care Activities:  . Self administers medications as prescribed .  Self-monitors blood glucose  Initial goal documentation      Hypertension   CMP Latest Ref Rng & Units 12/04/2018 07/16/2018 12/11/2017  Glucose 70 - 99 mg/dL 335(H) 256(H) 215(H)  BUN 6 - 23 mg/dL 19 24(H) 15  Creatinine 0.40 - 1.20 mg/dL 1.19 1.36(H) 1.22(H)  Sodium 135 - 145 mEq/L 139 140 140  Potassium 3.5 - 5.1 mEq/L 4.7 4.8 4.9  Chloride 96 - 112 mEq/L 104 104 102  CO2 19 - 32 mEq/L '24 26 20  ' Calcium 8.4 - 10.5 mg/dL 9.2 9.5 9.4  Total Protein 6.0 - 8.3 g/dL - 6.8 -  Total Bilirubin 0.2 - 1.2 mg/dL - 0.5 -  Alkaline Phos 39 - 117 U/L - 87 -  AST 0 - 37 U/L - 18 -  ALT 0 - 35 U/L - 16 -   Office blood pressures are: BP Readings from Last 3 Encounters:  04/08/19 126/75  03/15/19 140/72  03/12/19 (!) 146/69   BP < 140/90 mmHg Patient has failed these meds in the past: none reported Patient checks BP at home: has home monitor but doesn't use it much, checks at [facility] Patient home BP readings are ranging: 138/76 mmHg  Patient is currently controlled  on the following medications:   No pharmacotherapy  We discussed: history of orthostatic hypotension    Plan: Continue current medications  Hyperlipidemia/CAD   Lipid Panel     Component Value Date/Time   CHOL 93 12/04/2018 0813   CHOL 106 10/17/2017 0927   TRIG 190.0 (H) 12/04/2018 0813   HDL 32.10 (L) 12/04/2018 0813   HDL 32 (L) 10/17/2017 0927   CHOLHDL 3 12/04/2018 0813   VLDL 38.0 12/04/2018 0813   LDLCALC 22 12/04/2018 0813   LDLCALC 44 10/17/2017 0927   LDLDIRECT 134.9 10/21/2011 0939   LABVLDL 30 10/17/2017 0927    CBC Latest Ref Rng & Units 12/04/2018 12/02/2017 11/23/2017  WBC 4.0 - 10.5 K/uL 6.9 8.1 7.9  Hemoglobin 12.0 - 15.0 g/dL 12.2 12.8 12.0  Hematocrit 36.0 - 46.0 % 37.5 38.0 36.8  Platelets 150.0 - 400.0 K/uL 213.0 256.0 239   LDL goal < 70 (CAD/CVA) Patient has failed these meds in past: none reported Patient is currently controlled on the following medications:   Aspirin 81 mg - 1  tablet daily  Plavix 75 mg - 1 tablet daily  Atorvastatin 40 mg - 1 tablet daily (PM)  Isosorbide mononitrate 30 mg - 1/2 tablet daily  We discussed: confirmed adherence, CBC stable  Plan: Continue current medications  Diabetes   Recent Relevant Labs: Lab Results  Component Value Date/Time   HGBA1C 11.0 (A) 03/15/2019 03:39 PM   HGBA1C 9.0 (H) 12/04/2018 08:13 AM   HGBA1C 8.8 (H) 07/16/2018 08:49 AM   MICROALBUR <0.7 07/16/2018 11:20 AM   MICROALBUR <0.7 11/26/2017 06:05 PM    A1c goal < 7% Checking BG: Weekly Recent FBG Readings: 70-80, up to 130s Hypoglycemia: denies < 70  Diet: eats one meal a day   Lunch: mostly fast foods, salad, sandwich from [employer], pasta  Snacks: potato chips  Drinks: coke, green tea with honey  Patient has failed these meds in past: glimepiride  Patient is currently uncontrolled on the following medications:   Metformin 500 mg - 1 tablet TID before meals   Glipizide 5 mg - 1 tablet BID  Last diabetic eye exam:  Lab Results  Component Value Date/Time   HMDIABEYEEXA Retinopathy (A) 05/10/2018 12:00 AM    Last diabetic foot exam: due for annual foot exam   We discussed: confirmed adherence, prefers to avoid insulin/injections  Plan: Continue current medications; Recommend checking blood glucose twice daily for the next 2 weeks. Mail LIS form to reduce cost of medications.   Heart Failure   Type: Diastolic Last ejection fraction: 10/2017 55-65% NYHA Class: II (slight limitation of activity) AHA HF Stage: C (Heart disease and symptoms present)  Patient has failed these meds in past: none  Patient is currently controlled on the following medications:   Metoprolol tartrate 25 mg - 1/2 tablet daily  Furosemide 20 mg - 1 tablet daily   Symptoms: some shortness of breath with walking long distances, denies swelling  Exercise: walks frequently We discussed: does not weigh daily, checks legs for swelling   Plan: Continue current  medications   Diabetic Neuropathy    Patient has failed these meds in past: none Patient is currently controlled on the following medications:   Gabapentin 100 mg - 1 tablet BID and 2 tablets at bedtime  We discussed: reports working well for pain, helps with sleep, some drowsiness with doses during the day, but tolerable  Plan: Continue current medications   GERD   Patient has failed these meds  in past: none Patient is currently controlled on the following medications:   Pantoprazole 20 mg - 1 tablet daily  We discussed: denies any symptoms  Plan: Continue current medications   Insomnia   Patient has failed these meds in past: none Patient is currently controlled on the following medications:   Trazodone 50 mg - 1 and 1/2 tablets qHs   We discussed: takes very sparingly   Plan: Continue current medications   Depression   Patient has failed these meds in past: Cymbalta - denies issues  Patient is currently controlled on the following medications:   No pharmacotherapy   We discussed: reports doing well without medication  Plan: Continue current medications   Vaccines   Reviewed and discussed patient's vaccination history.    Immunization History  Administered Date(s) Administered  . Fluad Quad(high Dose 65+) 12/11/2018  . Influenza Split 12/17/2011  . Influenza Whole 01/22/2013  . Influenza,inj,Quad PF,6+ Mos 11/10/2015, 12/26/2016, 11/26/2017  . Pneumococcal Conjugate-13 12/26/2016  . Pneumococcal-Unspecified 11/30/2010  . Td 08/10/2003   Plan: Recommended patient receive Shingrix and Tdap.   Medication Management  Misc:  doxycycline 100 mg - completed course, latanoprost 0.005% - 1 drop in each eye at bedtime, metoclopramide 10 mg prn n/v, albuterol inhaler (PRN - not often)    OTCs: Tylenol PRN, B12 1000 mcg - 1 every morning, docusate 100 mg daily (not taking)  Pharmacy/Benefits: [employer]), would like to switch to [facility] delivery   Affordability: all are free except eye drops - 3.70, gabapentin - 3.70   Financial Resource Strain: Medium Risk  . Difficulty of Paying Living Expenses: Somewhat hard  --> Applying for low income subsidy   Verbal consent obtained for [facility] enhanced pharmacy services (medication synchronization, adherence packaging, delivery coordination). A medication sync plan was created to allow patient to get all medications delivered once every 30 to 90 days per patient preference. Patient understands they have freedom to choose pharmacy and clinical pharmacist will coordinate care between all prescribers and [facility].  CCM Follow Up:  07/06/19 at 2:45 PM (telephone)  [name], PharmD Clinical Pharmacist [facility] [phone]

## 2019-07-06 NOTE — ED Triage Notes (Signed)
Pt endorses generalized abd pain for a week. Unable to eat much. Reports increase in gas

## 2019-07-06 NOTE — Telephone Encounter (Signed)
Spoke with patient. She is on her way to ER.  R abd pain, no appetite, unable to eat anything, nauseated.  Will await ER eval.

## 2019-07-07 ENCOUNTER — Inpatient Hospital Stay (HOSPITAL_COMMUNITY): Payer: Medicare HMO | Admitting: Anesthesiology

## 2019-07-07 ENCOUNTER — Emergency Department (HOSPITAL_COMMUNITY): Payer: Medicare HMO

## 2019-07-07 ENCOUNTER — Encounter (HOSPITAL_COMMUNITY): Payer: Self-pay | Admitting: Internal Medicine

## 2019-07-07 ENCOUNTER — Encounter (HOSPITAL_COMMUNITY)
Admission: EM | Disposition: A | Discharge status: Home / Self Care | Payer: Self-pay | Source: Home / Self Care | Attending: Internal Medicine

## 2019-07-07 DIAGNOSIS — E119 Type 2 diabetes mellitus without complications: Secondary | ICD-10-CM | POA: Diagnosis not present

## 2019-07-07 DIAGNOSIS — G43711 Chronic migraine without aura, intractable, with status migrainosus: Secondary | ICD-10-CM | POA: Diagnosis not present

## 2019-07-07 DIAGNOSIS — I2510 Atherosclerotic heart disease of native coronary artery without angina pectoris: Secondary | ICD-10-CM | POA: Diagnosis not present

## 2019-07-07 DIAGNOSIS — Z01810 Encounter for preprocedural cardiovascular examination: Secondary | ICD-10-CM | POA: Diagnosis not present

## 2019-07-07 DIAGNOSIS — E785 Hyperlipidemia, unspecified: Secondary | ICD-10-CM | POA: Diagnosis present

## 2019-07-07 DIAGNOSIS — K810 Acute cholecystitis: Secondary | ICD-10-CM | POA: Diagnosis not present

## 2019-07-07 DIAGNOSIS — R1011 Right upper quadrant pain: Secondary | ICD-10-CM | POA: Diagnosis not present

## 2019-07-07 DIAGNOSIS — N1831 Chronic kidney disease, stage 3a: Secondary | ICD-10-CM | POA: Diagnosis present

## 2019-07-07 DIAGNOSIS — I25810 Atherosclerosis of coronary artery bypass graft(s) without angina pectoris: Secondary | ICD-10-CM

## 2019-07-07 DIAGNOSIS — M7989 Other specified soft tissue disorders: Secondary | ICD-10-CM | POA: Diagnosis not present

## 2019-07-07 DIAGNOSIS — I25118 Atherosclerotic heart disease of native coronary artery with other forms of angina pectoris: Secondary | ICD-10-CM | POA: Diagnosis not present

## 2019-07-07 DIAGNOSIS — K819 Cholecystitis, unspecified: Secondary | ICD-10-CM | POA: Diagnosis not present

## 2019-07-07 DIAGNOSIS — R002 Palpitations: Secondary | ICD-10-CM | POA: Diagnosis not present

## 2019-07-07 DIAGNOSIS — I10 Essential (primary) hypertension: Secondary | ICD-10-CM | POA: Diagnosis not present

## 2019-07-07 DIAGNOSIS — I110 Hypertensive heart disease with heart failure: Secondary | ICD-10-CM | POA: Diagnosis not present

## 2019-07-07 DIAGNOSIS — J9601 Acute respiratory failure with hypoxia: Secondary | ICD-10-CM | POA: Diagnosis not present

## 2019-07-07 DIAGNOSIS — R110 Nausea: Secondary | ICD-10-CM | POA: Diagnosis not present

## 2019-07-07 DIAGNOSIS — K8010 Calculus of gallbladder with chronic cholecystitis without obstruction: Secondary | ICD-10-CM | POA: Diagnosis not present

## 2019-07-07 DIAGNOSIS — I5032 Chronic diastolic (congestive) heart failure: Secondary | ICD-10-CM

## 2019-07-07 DIAGNOSIS — J439 Emphysema, unspecified: Secondary | ICD-10-CM | POA: Diagnosis present

## 2019-07-07 DIAGNOSIS — K5730 Diverticulosis of large intestine without perforation or abscess without bleeding: Secondary | ICD-10-CM | POA: Diagnosis not present

## 2019-07-07 DIAGNOSIS — N390 Urinary tract infection, site not specified: Secondary | ICD-10-CM | POA: Diagnosis not present

## 2019-07-07 DIAGNOSIS — G43811 Other migraine, intractable, with status migrainosus: Secondary | ICD-10-CM | POA: Diagnosis not present

## 2019-07-07 DIAGNOSIS — K567 Ileus, unspecified: Secondary | ICD-10-CM | POA: Diagnosis not present

## 2019-07-07 DIAGNOSIS — I130 Hypertensive heart and chronic kidney disease with heart failure and stage 1 through stage 4 chronic kidney disease, or unspecified chronic kidney disease: Secondary | ICD-10-CM | POA: Diagnosis not present

## 2019-07-07 DIAGNOSIS — I2699 Other pulmonary embolism without acute cor pulmonale: Secondary | ICD-10-CM | POA: Diagnosis not present

## 2019-07-07 DIAGNOSIS — E1165 Type 2 diabetes mellitus with hyperglycemia: Secondary | ICD-10-CM | POA: Diagnosis not present

## 2019-07-07 DIAGNOSIS — R0602 Shortness of breath: Secondary | ICD-10-CM | POA: Diagnosis not present

## 2019-07-07 DIAGNOSIS — G43901 Migraine, unspecified, not intractable, with status migrainosus: Secondary | ICD-10-CM | POA: Diagnosis not present

## 2019-07-07 DIAGNOSIS — R519 Headache, unspecified: Secondary | ICD-10-CM | POA: Diagnosis not present

## 2019-07-07 DIAGNOSIS — R739 Hyperglycemia, unspecified: Secondary | ICD-10-CM | POA: Diagnosis not present

## 2019-07-07 DIAGNOSIS — N183 Chronic kidney disease, stage 3 unspecified: Secondary | ICD-10-CM | POA: Diagnosis not present

## 2019-07-07 DIAGNOSIS — N179 Acute kidney failure, unspecified: Secondary | ICD-10-CM | POA: Diagnosis not present

## 2019-07-07 DIAGNOSIS — E1142 Type 2 diabetes mellitus with diabetic polyneuropathy: Secondary | ICD-10-CM | POA: Diagnosis present

## 2019-07-07 DIAGNOSIS — K8000 Calculus of gallbladder with acute cholecystitis without obstruction: Secondary | ICD-10-CM | POA: Diagnosis not present

## 2019-07-07 DIAGNOSIS — K760 Fatty (change of) liver, not elsewhere classified: Secondary | ICD-10-CM | POA: Diagnosis not present

## 2019-07-07 DIAGNOSIS — I4892 Unspecified atrial flutter: Secondary | ICD-10-CM | POA: Diagnosis not present

## 2019-07-07 DIAGNOSIS — G43011 Migraine without aura, intractable, with status migrainosus: Secondary | ICD-10-CM | POA: Diagnosis not present

## 2019-07-07 DIAGNOSIS — I471 Supraventricular tachycardia: Secondary | ICD-10-CM | POA: Diagnosis present

## 2019-07-07 DIAGNOSIS — E1169 Type 2 diabetes mellitus with other specified complication: Secondary | ICD-10-CM | POA: Diagnosis not present

## 2019-07-07 DIAGNOSIS — E875 Hyperkalemia: Secondary | ICD-10-CM | POA: Diagnosis present

## 2019-07-07 DIAGNOSIS — R1031 Right lower quadrant pain: Secondary | ICD-10-CM | POA: Diagnosis not present

## 2019-07-07 DIAGNOSIS — I361 Nonrheumatic tricuspid (valve) insufficiency: Secondary | ICD-10-CM | POA: Diagnosis not present

## 2019-07-07 DIAGNOSIS — E1149 Type 2 diabetes mellitus with other diabetic neurological complication: Secondary | ICD-10-CM | POA: Diagnosis not present

## 2019-07-07 DIAGNOSIS — E1122 Type 2 diabetes mellitus with diabetic chronic kidney disease: Secondary | ICD-10-CM | POA: Diagnosis not present

## 2019-07-07 DIAGNOSIS — Z20822 Contact with and (suspected) exposure to covid-19: Secondary | ICD-10-CM | POA: Diagnosis not present

## 2019-07-07 DIAGNOSIS — I82442 Acute embolism and thrombosis of left tibial vein: Secondary | ICD-10-CM | POA: Diagnosis not present

## 2019-07-07 DIAGNOSIS — K828 Other specified diseases of gallbladder: Secondary | ICD-10-CM | POA: Diagnosis present

## 2019-07-07 HISTORY — PX: CHOLECYSTECTOMY: SHX55

## 2019-07-07 HISTORY — DX: Acute cholecystitis: K81.0

## 2019-07-07 LAB — RESPIRATORY PANEL BY RT PCR (FLU A&B, COVID)
Influenza A by PCR: NEGATIVE
Influenza B by PCR: NEGATIVE
SARS Coronavirus 2 by RT PCR: NEGATIVE

## 2019-07-07 LAB — LACTIC ACID, PLASMA: Lactic Acid, Venous: 1.4 mmol/L (ref 0.5–1.9)

## 2019-07-07 LAB — GLUCOSE, CAPILLARY
Glucose-Capillary: 185 mg/dL — ABNORMAL HIGH (ref 70–99)
Glucose-Capillary: 154 mg/dL — ABNORMAL HIGH (ref 70–99)
Glucose-Capillary: 357 mg/dL — ABNORMAL HIGH (ref 70–99)

## 2019-07-07 LAB — CBG MONITORING, ED: Glucose-Capillary: 207 mg/dL — ABNORMAL HIGH (ref 70–99)

## 2019-07-07 SURGERY — LAPAROSCOPIC CHOLECYSTECTOMY
Anesthesia: General | Site: Abdomen

## 2019-07-07 MED ORDER — FENTANYL CITRATE (PF) 100 MCG/2ML IJ SOLN
25.0000 ug | INTRAMUSCULAR | Status: DC | PRN
  Administered 2019-07-07 (×4): 25 ug via INTRAVENOUS

## 2019-07-07 MED ORDER — PROPOFOL 10 MG/ML IV BOLUS
INTRAVENOUS | Status: DC | PRN
  Administered 2019-07-07: 80 mg via INTRAVENOUS

## 2019-07-07 MED ORDER — SODIUM CHLORIDE 0.9 % IV SOLN
2.0000 g | INTRAVENOUS | Status: DC
  Filled 2019-07-07: qty 20

## 2019-07-07 MED ORDER — MEPERIDINE HCL 25 MG/ML IJ SOLN: 6.2500 mg | INTRAMUSCULAR | Status: DC | PRN

## 2019-07-07 MED ORDER — FENTANYL CITRATE (PF) 100 MCG/2ML IJ SOLN
INTRAMUSCULAR | Status: AC
  Filled 2019-07-07: qty 2

## 2019-07-07 MED ORDER — PROMETHAZINE HCL 25 MG/ML IJ SOLN: 6.2500 mg | INTRAMUSCULAR | Status: DC | PRN

## 2019-07-07 MED ORDER — ROCURONIUM BROMIDE 50 MG/5ML IV SOSY
PREFILLED_SYRINGE | INTRAVENOUS | Status: DC | PRN
  Administered 2019-07-07: 40 mg via INTRAVENOUS

## 2019-07-07 MED ORDER — FENTANYL CITRATE (PF) 250 MCG/5ML IJ SOLN
INTRAMUSCULAR | Status: AC
  Filled 2019-07-07: qty 5

## 2019-07-07 MED ORDER — PROPOFOL 10 MG/ML IV BOLUS
INTRAVENOUS | Status: AC
  Filled 2019-07-07: qty 20

## 2019-07-07 MED ORDER — INDOCYANINE GREEN 25 MG IV SOLR
INTRAVENOUS | Status: DC | PRN
  Administered 2019-07-07: 2.5 mg via INTRAVENOUS

## 2019-07-07 MED ORDER — TRAZODONE HCL 150 MG PO TABS
75.0000 mg | ORAL_TABLET | Freq: Every evening | ORAL | Status: DC | PRN
  Administered 2019-07-10 – 2019-07-22 (×10): 75 mg via ORAL
  Filled 2019-07-07 (×10): qty 1

## 2019-07-07 MED ORDER — BISACODYL 5 MG PO TBEC: 5.0000 mg | DELAYED_RELEASE_TABLET | Freq: Every day | ORAL | Status: DC | PRN

## 2019-07-07 MED ORDER — ONDANSETRON HCL 4 MG/2ML IJ SOLN
4.0000 mg | Freq: Once | INTRAMUSCULAR | Status: AC
  Administered 2019-07-07: 4 mg via INTRAVENOUS
  Filled 2019-07-07: qty 2

## 2019-07-07 MED ORDER — IOHEXOL 300 MG/ML  SOLN
100.0000 mL | Freq: Once | INTRAMUSCULAR | Status: AC | PRN
  Administered 2019-07-07: 100 mL via INTRAVENOUS

## 2019-07-07 MED ORDER — LIDOCAINE 2% (20 MG/ML) 5 ML SYRINGE
INTRAMUSCULAR | Status: DC | PRN
  Administered 2019-07-07: 60 mg via INTRAVENOUS

## 2019-07-07 MED ORDER — BUPIVACAINE HCL 0.25 % IJ SOLN
INTRAMUSCULAR | Status: DC | PRN
  Administered 2019-07-07: 20 mL

## 2019-07-07 MED ORDER — ONDANSETRON HCL 4 MG/2ML IJ SOLN
4.0000 mg | Freq: Four times a day (QID) | INTRAMUSCULAR | Status: DC | PRN
  Administered 2019-07-07 – 2019-07-13 (×10): 4 mg via INTRAVENOUS
  Filled 2019-07-07 (×10): qty 2

## 2019-07-07 MED ORDER — GABAPENTIN 100 MG PO CAPS
100.0000 mg | ORAL_CAPSULE | Freq: Three times a day (TID) | ORAL | Status: DC
  Administered 2019-07-07 – 2019-07-13 (×20): 100 mg via ORAL
  Filled 2019-07-07 (×20): qty 1

## 2019-07-07 MED ORDER — SODIUM CHLORIDE 0.9 % IR SOLN
Status: DC | PRN
  Administered 2019-07-07: 1000 mL

## 2019-07-07 MED ORDER — DIPHENHYDRAMINE HCL 50 MG/ML IJ SOLN
25.0000 mg | Freq: Once | INTRAMUSCULAR | Status: AC
  Administered 2019-07-07: 25 mg via INTRAVENOUS
  Filled 2019-07-07: qty 1

## 2019-07-07 MED ORDER — DOCUSATE SODIUM 100 MG PO CAPS
100.0000 mg | ORAL_CAPSULE | Freq: Two times a day (BID) | ORAL | Status: DC
  Administered 2019-07-07 – 2019-07-14 (×9): 100 mg via ORAL
  Filled 2019-07-07 (×12): qty 1

## 2019-07-07 MED ORDER — DEXAMETHASONE SODIUM PHOSPHATE 10 MG/ML IJ SOLN
INTRAMUSCULAR | Status: DC | PRN
  Administered 2019-07-07: 10 mg via INTRAVENOUS

## 2019-07-07 MED ORDER — ACETAMINOPHEN 160 MG/5ML PO SOLN: 325.0000 mg | Freq: Once | ORAL | Status: DC | PRN

## 2019-07-07 MED ORDER — ISOSORBIDE MONONITRATE ER 30 MG PO TB24
15.0000 mg | ORAL_TABLET | Freq: Every day | ORAL | Status: DC
  Administered 2019-07-08 – 2019-07-23 (×16): 15 mg via ORAL
  Filled 2019-07-07 (×16): qty 1

## 2019-07-07 MED ORDER — POLYETHYLENE GLYCOL 3350 17 G PO PACK: 17.0000 g | PACK | Freq: Every day | ORAL | Status: DC | PRN

## 2019-07-07 MED ORDER — ALBUTEROL SULFATE (2.5 MG/3ML) 0.083% IN NEBU: 3.0000 mL | INHALATION_SOLUTION | Freq: Four times a day (QID) | RESPIRATORY_TRACT | Status: DC | PRN

## 2019-07-07 MED ORDER — INSULIN ASPART 100 UNIT/ML ~~LOC~~ SOLN
0.0000 [IU] | Freq: Every day | SUBCUTANEOUS | Status: DC
  Administered 2019-07-07: 5 [IU] via SUBCUTANEOUS
  Administered 2019-07-08 – 2019-07-09 (×2): 2 [IU] via SUBCUTANEOUS
  Administered 2019-07-10: 4 [IU] via SUBCUTANEOUS

## 2019-07-07 MED ORDER — ACETAMINOPHEN 325 MG PO TABS: 325.0000 mg | ORAL_TABLET | Freq: Once | ORAL | Status: DC | PRN

## 2019-07-07 MED ORDER — LACTATED RINGERS IV SOLN: INTRAVENOUS | Status: DC | PRN

## 2019-07-07 MED ORDER — ATORVASTATIN CALCIUM 40 MG PO TABS
40.0000 mg | ORAL_TABLET | Freq: Every day | ORAL | Status: DC
  Administered 2019-07-08 – 2019-07-23 (×16): 40 mg via ORAL
  Filled 2019-07-07 (×17): qty 1

## 2019-07-07 MED ORDER — 0.9 % SODIUM CHLORIDE (POUR BTL) OPTIME
TOPICAL | Status: DC | PRN
  Administered 2019-07-07: 1000 mL

## 2019-07-07 MED ORDER — MORPHINE SULFATE (PF) 2 MG/ML IV SOLN: 2.0000 mg | INTRAVENOUS | Status: DC | PRN

## 2019-07-07 MED ORDER — METOPROLOL TARTRATE 12.5 MG HALF TABLET
12.5000 mg | ORAL_TABLET | Freq: Two times a day (BID) | ORAL | Status: DC
  Administered 2019-07-07 – 2019-07-23 (×31): 12.5 mg via ORAL
  Filled 2019-07-07 (×32): qty 1

## 2019-07-07 MED ORDER — LATANOPROST 0.005 % OP SOLN
1.0000 [drp] | Freq: Every day | OPHTHALMIC | Status: DC
  Administered 2019-07-07 – 2019-07-22 (×15): 1 [drp] via OPHTHALMIC
  Filled 2019-07-07 (×2): qty 2.5

## 2019-07-07 MED ORDER — SUGAMMADEX SODIUM 200 MG/2ML IV SOLN
INTRAVENOUS | Status: DC | PRN
Start: 2019-07-07 — End: 2019-07-07
  Administered 2019-07-07: 200 mg via INTRAVENOUS

## 2019-07-07 MED ORDER — SODIUM CHLORIDE 0.9 % IV BOLUS
500.0000 mL | Freq: Once | INTRAVENOUS | Status: AC
  Administered 2019-07-07: 500 mL via INTRAVENOUS

## 2019-07-07 MED ORDER — BUPIVACAINE HCL (PF) 0.25 % IJ SOLN
INTRAMUSCULAR | Status: AC
  Filled 2019-07-07: qty 30

## 2019-07-07 MED ORDER — LACTATED RINGERS IV SOLN: INTRAVENOUS | Status: DC

## 2019-07-07 MED ORDER — FENTANYL CITRATE (PF) 100 MCG/2ML IJ SOLN
INTRAMUSCULAR | Status: DC | PRN
  Administered 2019-07-07: 75 ug via INTRAVENOUS
  Administered 2019-07-07: 25 ug via INTRAVENOUS

## 2019-07-07 MED ORDER — ACETAMINOPHEN 650 MG RE SUPP: 650.0000 mg | Freq: Four times a day (QID) | RECTAL | Status: DC | PRN

## 2019-07-07 MED ORDER — PHENYLEPHRINE HCL (PRESSORS) 10 MG/ML IV SOLN
INTRAVENOUS | Status: DC | PRN
  Administered 2019-07-07: 120 ug via INTRAVENOUS

## 2019-07-07 MED ORDER — ACETAMINOPHEN 325 MG PO TABS
650.0000 mg | ORAL_TABLET | Freq: Four times a day (QID) | ORAL | Status: DC | PRN
  Administered 2019-07-10 – 2019-07-16 (×6): 650 mg via ORAL
  Filled 2019-07-07 (×7): qty 2

## 2019-07-07 MED ORDER — PHENYLEPHRINE HCL-NACL 10-0.9 MG/250ML-% IV SOLN
INTRAVENOUS | Status: DC | PRN
  Administered 2019-07-07: 50 ug/min via INTRAVENOUS

## 2019-07-07 MED ORDER — HYDROCODONE-ACETAMINOPHEN 5-325 MG PO TABS
1.0000 | ORAL_TABLET | ORAL | Status: DC | PRN
  Administered 2019-07-07 – 2019-07-09 (×6): 2 via ORAL
  Administered 2019-07-09: 1 via ORAL
  Administered 2019-07-10 – 2019-07-11 (×4): 2 via ORAL
  Administered 2019-07-12: 1 via ORAL
  Administered 2019-07-13: 2 via ORAL
  Administered 2019-07-14: 1 via ORAL
  Filled 2019-07-07 (×4): qty 2
  Filled 2019-07-07: qty 1
  Filled 2019-07-07: qty 2
  Filled 2019-07-07: qty 1
  Filled 2019-07-07 (×4): qty 2
  Filled 2019-07-07: qty 1
  Filled 2019-07-07 (×2): qty 2

## 2019-07-07 MED ORDER — ONDANSETRON HCL 4 MG/2ML IJ SOLN
INTRAMUSCULAR | Status: DC | PRN
  Administered 2019-07-07: 4 mg via INTRAVENOUS

## 2019-07-07 MED ORDER — HYDRALAZINE HCL 20 MG/ML IJ SOLN: 5.0000 mg | INTRAMUSCULAR | Status: DC | PRN

## 2019-07-07 MED ORDER — SODIUM CHLORIDE 0.9 % IV SOLN: INTRAVENOUS | Status: DC

## 2019-07-07 MED ORDER — SODIUM CHLORIDE 0.9 % IV SOLN
2.0000 g | Freq: Once | INTRAVENOUS | Status: AC
  Administered 2019-07-07: 2 g via INTRAVENOUS
  Filled 2019-07-07: qty 20

## 2019-07-07 MED ORDER — ACETAMINOPHEN 10 MG/ML IV SOLN: 1000.0000 mg | Freq: Once | INTRAVENOUS | Status: DC | PRN

## 2019-07-07 MED ORDER — INSULIN ASPART 100 UNIT/ML ~~LOC~~ SOLN
0.0000 [IU] | Freq: Three times a day (TID) | SUBCUTANEOUS | Status: DC
  Administered 2019-07-07: 5 [IU] via SUBCUTANEOUS
  Administered 2019-07-08: 8 [IU] via SUBCUTANEOUS
  Administered 2019-07-08: 3 [IU] via SUBCUTANEOUS
  Administered 2019-07-08: 11 [IU] via SUBCUTANEOUS
  Administered 2019-07-09 (×2): 5 [IU] via SUBCUTANEOUS
  Administered 2019-07-09: 3 [IU] via SUBCUTANEOUS
  Administered 2019-07-10: 08:00:00 5 [IU] via SUBCUTANEOUS
  Administered 2019-07-10: 8 [IU] via SUBCUTANEOUS
  Administered 2019-07-11 (×3): 5 [IU] via SUBCUTANEOUS
  Administered 2019-07-12: 3 [IU] via SUBCUTANEOUS
  Administered 2019-07-12: 2 [IU] via SUBCUTANEOUS
  Administered 2019-07-12: 3 [IU] via SUBCUTANEOUS
  Administered 2019-07-13: 2 [IU] via SUBCUTANEOUS
  Administered 2019-07-13: 3 [IU] via SUBCUTANEOUS
  Administered 2019-07-14: 2 [IU] via SUBCUTANEOUS
  Administered 2019-07-14 – 2019-07-15 (×4): 3 [IU] via SUBCUTANEOUS
  Administered 2019-07-16: 5 [IU] via SUBCUTANEOUS
  Administered 2019-07-16: 2 [IU] via SUBCUTANEOUS
  Administered 2019-07-17 – 2019-07-19 (×5): 3 [IU] via SUBCUTANEOUS
  Administered 2019-07-20: 2 [IU] via SUBCUTANEOUS
  Administered 2019-07-21: 3 [IU] via SUBCUTANEOUS
  Administered 2019-07-22 (×2): 2 [IU] via SUBCUTANEOUS
  Administered 2019-07-22: 5 [IU] via SUBCUTANEOUS
  Administered 2019-07-23: 2 [IU] via SUBCUTANEOUS
  Administered 2019-07-23: 3 [IU] via SUBCUTANEOUS

## 2019-07-07 SURGICAL SUPPLY — 36 items
ADH SKN CLS APL DERMABOND .7 (GAUZE/BANDAGES/DRESSINGS) ×1
APL PRP STRL LF DISP 70% ISPRP (MISCELLANEOUS) ×1
APPLIER CLIP 5 13 M/L LIGAMAX5 (MISCELLANEOUS) ×2
APR CLP MED LRG 5 ANG JAW (MISCELLANEOUS) ×1
BAG SPEC RTRVL LRG 6X4 10 (ENDOMECHANICALS) ×1
CANISTER SUCT 3000ML PPV (MISCELLANEOUS) ×2 IMPLANT
CHLORAPREP W/TINT 26 (MISCELLANEOUS) ×2 IMPLANT
CLIP APPLIE 5 13 M/L LIGAMAX5 (MISCELLANEOUS) ×1 IMPLANT
COVER SURGICAL LIGHT HANDLE (MISCELLANEOUS) ×2 IMPLANT
COVER WAND RF STERILE (DRAPES) ×2 IMPLANT
DERMABOND ADVANCED (GAUZE/BANDAGES/DRESSINGS) ×1
DERMABOND ADVANCED .7 DNX12 (GAUZE/BANDAGES/DRESSINGS) ×1 IMPLANT
ELECT REM PT RETURN 9FT ADLT (ELECTROSURGICAL) ×2
ELECTRODE REM PT RTRN 9FT ADLT (ELECTROSURGICAL) ×1 IMPLANT
GLOVE SURG SIGNA 7.5 PF LTX (GLOVE) ×2 IMPLANT
GOWN STRL REUS W/ TWL LRG LVL3 (GOWN DISPOSABLE) ×2 IMPLANT
GOWN STRL REUS W/ TWL XL LVL3 (GOWN DISPOSABLE) ×1 IMPLANT
GOWN STRL REUS W/TWL LRG LVL3 (GOWN DISPOSABLE) ×4
GOWN STRL REUS W/TWL XL LVL3 (GOWN DISPOSABLE) ×2
KIT BASIN OR (CUSTOM PROCEDURE TRAY) ×2 IMPLANT
KIT TURNOVER KIT B (KITS) ×2 IMPLANT
NS IRRIG 1000ML POUR BTL (IV SOLUTION) ×2 IMPLANT
PAD ARMBOARD 7.5X6 YLW CONV (MISCELLANEOUS) ×2 IMPLANT
POUCH SPECIMEN RETRIEVAL 10MM (ENDOMECHANICALS) ×2 IMPLANT
SCISSORS LAP 5X35 DISP (ENDOMECHANICALS) ×2 IMPLANT
SET IRRIG TUBING LAPAROSCOPIC (IRRIGATION / IRRIGATOR) ×2 IMPLANT
SET TUBE SMOKE EVAC HIGH FLOW (TUBING) ×2 IMPLANT
SLEEVE ENDOPATH XCEL 5M (ENDOMECHANICALS) ×4 IMPLANT
SPECIMEN JAR SMALL (MISCELLANEOUS) ×2 IMPLANT
SUT MNCRL AB 4-0 PS2 18 (SUTURE) ×2 IMPLANT
TOWEL GREEN STERILE (TOWEL DISPOSABLE) ×2 IMPLANT
TOWEL GREEN STERILE FF (TOWEL DISPOSABLE) ×2 IMPLANT
TRAY LAPAROSCOPIC MC (CUSTOM PROCEDURE TRAY) ×2 IMPLANT
TROCAR XCEL BLUNT TIP 100MML (ENDOMECHANICALS) ×2 IMPLANT
TROCAR XCEL NON-BLD 5MMX100MML (ENDOMECHANICALS) ×2 IMPLANT
WATER STERILE IRR 1000ML POUR (IV SOLUTION) ×2 IMPLANT

## 2019-07-07 NOTE — ED Notes (Signed)
Pt returned from [place] via stretcher, hooked back up to monitoring equipment. Pt complaining of feeling "welt everywhere that just itch so bad." Pt noted to have 1in welt on lower left abdomen & upper left shoulder blade. MD Pollina notified of pt complaint & assessment.

## 2019-07-07 NOTE — Op Note (Signed)
LAPAROSCOPIC CHOLECYSTECTOMY WITH FLOUROSCOPIC VISUALIZATION OF THE COMMON BILE DUCT Procedure Note  Maria Fox 07/06/2019 - 07/07/2019   Pre-op Diagnosis: Cholecystitis     Post-op Diagnosis: SAME  Procedure(s): LAPAROSCOPIC CHOLECYSTECTOMY WITH FLUOROSCOPIC VISUALIZATION OF THE COMMON BILE DUCT   Surgeon(s): Coralie Keens, MD  Anesthesia: General  Staff:  Circulator: Harrel Lemon, RN Physician Assistant: Jillyn Ledger, PA-C Scrub Person: Lewayne Bunting Vendor Representative : White, [place]  Estimated Blood Loss: Minimal               Specimens: SENT TO PATH  Findings: The patient does not have an acutely inflamed gallbladder with gallstones.  No other abnormalities were identified.  Procedure: The patient was identified in the preoperative holding area.  She did undergo injection of ICD dye prior to the procedure.  He was then taken to the operating room.  She is placed on the operating table general anesthesia was induced.  Her abdomen was then prepped and draped in usual sterile fashion.  I made a small incision with a scalpel just below the umbilicus.  I carried this down to the fascia which was opened with scalpel as well.  Hemostat was then used to gain entrance to the peritoneal cavity under direct vision.  A 0 Vicryl pursestring suture was placed around the fascial opening.  The [facility] port was placed the opening and insufflation of the abdomen was begun.  A 5 mm trocar was then placed the patient epigastrium and 2 more in the right upper quadrant all under direct vision.  The gallbladder is found to be acutely inflamed and mildly distended.  It was grasped and retracted above the liver bed.  Dissection was then carried out the base the gallbladder.  I was able to easily peel the omentum and duodenum away from the base.  I then dissected out the cystic duct and cystic artery and achieved a critical window around both.  We then used the ICD that  had been injected and changed camera settings to easily visualize the anatomy of the location of the common bile duct.  We then turned the camera back to the normal settings.  The cystic duct was then clipped 3 times proximally once distally and transected.  The cystic artery was then clipped proximally distally and transected as well.  The gallbladder was then slowly dissected free from the liver bed with the electrocautery.  Once it was free from the liver bed was placed in an Endosac and removed through the incision at the umbilicus.  Liver bed was examined hemostasis felt to be achieved.  0 Vicryl the umbilicus was tied in place closing the fascial defect.  The right upper quadrant was then irrigated with normal saline.  Again hemostasis appeared to be achieved.  All ports were then removed under direct vision and the abdomen was deflated.  All incisions were anesthetized with Marcaine and closed with 4-0 Monocryl sutures.  Dermabond was then applied.  The patient tolerated the procedure well.  All the counts were correct at the end the procedure.  The patient was then extubated in the operating room and taken in a stable condition to the recovery room.          Coralie Keens   Date: 07/07/2019  Time: 2:31 PM
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
## 2019-07-07 NOTE — Progress Notes (Signed)
Patient ID: Maria Fox, female   DOB: Mar 06, 1946, 74 y.o.   MRN: [id number]   Patient cleared by cardiology for surgery.  Again, she has cholecystitis with cholelithiasis. I discussed the procedure in detail.   We discussed the risks and benefits of a laparoscopic cholecystectomy  including, but not limited to bleeding, infection, injury to surrounding structures such as the intestine or liver, bile leak, retained gallstones, need to convert to an open procedure, prolonged diarrhea, blood clots such as  DVT, common bile duct injury, anesthesia risks, and possible need for additional procedures.  The likelihood of improvement in symptoms and return to the patient's normal status is good. We discussed the typical post-operative recovery course.
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
## 2019-07-07 NOTE — Discharge Instructions (Addendum)
Information on my medicine - ELIQUIS (apixaban)  This medication education was reviewed with me or my healthcare representative as part of my discharge preparation.    Why was Eliquis prescribed for you? Eliquis was prescribed to treat blood clots that may have been found in the veins of your legs (deep vein thrombosis) or in your lungs (pulmonary embolism) and to reduce the risk of them occurring again.  What do You need to know about Eliquis ? The starting dose is 10 mg (two 5 mg tablets) taken TWICE daily for the FIRST SEVEN (7) DAYS, then on 07/19/2019  the dose is reduced to ONE 5 mg tablet taken TWICE daily.  Eliquis may be taken with or without food.   Try to take the dose about the same time in the morning and in the evening. If you have difficulty swallowing the tablet whole please discuss with your pharmacist how to take the medication safely.  Take Eliquis exactly as prescribed and DO NOT stop taking Eliquis without talking to the doctor who prescribed the medication.  Stopping may increase your risk of developing a new blood clot.  Refill your prescription before you run out.  After discharge, you should have regular check-up appointments with your healthcare provider that is prescribing your Eliquis.    What do you do if you miss a dose? If a dose of ELIQUIS is not taken at the scheduled time, take it as soon as possible on the same day and twice-daily administration should be resumed. The dose should not be doubled to make up for a missed dose.  Important Safety Information A possible side effect of Eliquis is bleeding. You should call your healthcare provider right away if you experience any of the following: ? Bleeding from an injury or your nose that does not stop. ? Unusual colored urine (red or dark brown) or unusual colored stools (red or black). ? Unusual bruising for unknown reasons. ? A serious fall or if you hit your head (even if there is no  bleeding).  Some medicines may interact with Eliquis and might increase your risk of bleeding or clotting while on Eliquis. To help avoid this, consult your healthcare provider or pharmacist prior to using any new prescription or non-prescription medications, including herbals, vitamins, non-steroidal anti-inflammatory drugs (NSAIDs) and supplements.  This website has more information on Eliquis (apixaban): http://www.eliquis.com/eliquis/home   ---------------------------------------------------------------------------------------------------------------------------------------------------------------  [facility]  Please arrive at least 30 min before your appointment to complete your check in paperwork.  If you are unable to arrive 30 min prior to your appointment time we may have to cancel or reschedule you. LAPAROSCOPIC SURGERY: POST OP INSTRUCTIONS Always review your discharge instruction sheet given to you by the facility where your surgery was performed. IF YOU HAVE DISABILITY OR FAMILY LEAVE FORMS, YOU MUST BRING THEM TO THE OFFICE FOR PROCESSING.   DO NOT GIVE THEM TO YOUR DOCTOR.  PAIN CONTROL  1. First take acetaminophen (Tylenol) AND/or ibuprofen (Advil) to control your pain after surgery.  Follow directions on package.  Taking acetaminophen (Tylenol) and/or ibuprofen (Advil) regularly after surgery will help to control your pain and lower the amount of prescription pain medication you may need.  You should not take more than 4,000 mg (4 grams) of acetaminophen (Tylenol) in 24 hours.  You should not take ibuprofen (Advil), aleve, motrin, naprosyn or other NSAIDS if you have a history of stomach ulcers or chronic kidney disease.  2. A prescription for pain  medication may be given to you upon discharge.  Take your pain medication as prescribed, if you still have uncontrolled pain after taking acetaminophen (Tylenol) or ibuprofen (Advil). 3. Use ice packs to  help control pain. 4. If you need a refill on your pain medication, please contact your pharmacy.  They will contact our office to request authorization. Prescriptions will not be filled after 5pm or on week-ends.  HOME MEDICATIONS 5. Take your usually prescribed medications unless otherwise directed.  DIET 6. You should follow a light diet the first few days after arrival home.  Be sure to include lots of fluids daily. Avoid fatty, fried foods.   CONSTIPATION 7. It is common to experience some constipation after surgery and if you are taking pain medication.  Increasing fluid intake and taking a stool softener (such as Colace) will usually help or prevent this problem from occurring.  A mild laxative (Milk of Magnesia or Miralax) should be taken according to package instructions if there are no bowel movements after 48 hours.  WOUND/INCISION CARE 8. Most patients will experience some swelling and bruising in the area of the incisions.  Ice packs will help.  Swelling and bruising can take several days to resolve.  9. Unless discharge instructions indicate otherwise, follow guidelines below  a. STERI-STRIPS - you may remove your outer bandages 48 hours after surgery, and you may shower at that time.  You have steri-strips (small skin tapes) in place directly over the incision.  These strips should be left on the skin for 7-10 days.   b. DERMABOND/SKIN GLUE - you may shower in 24 hours.  The glue will flake off over the next 2-3 weeks. 10. Any sutures or staples will be removed at the office during your follow-up visit.  ACTIVITIES 11. You may resume regular (light) daily activities beginning the next day--such as daily self-care, walking, climbing stairs--gradually increasing activities as tolerated.  You may have sexual intercourse when it is comfortable.  Refrain from any heavy lifting or straining until approved by your doctor. a. You may drive when you are no longer taking prescription pain  medication, you can comfortably wear a seatbelt, and you can safely maneuver your car and apply brakes.  FOLLOW-UP 12. You should see your doctor in the office for a follow-up appointment approximately 2-3 weeks after your surgery.  You should have been given your post-op/follow-up appointment when your surgery was scheduled.  If you did not receive a post-op/follow-up appointment, make sure that you call for this appointment within a day or two after you arrive home to insure a convenient appointment time.   WHEN TO CALL YOUR DOCTOR: 1. Fever over 101.0 2. Inability to urinate 3. Continued bleeding from incision. 4. Increased pain, redness, or drainage from the incision. 5. Increasing abdominal pain  The clinic staff is available to answer your questions during regular business hours.  Please don't hesitate to call and ask to speak to one of the nurses for clinical concerns.  If you have a medical emergency, go to the nearest emergency room or call 911.  A surgeon from [facility] is always on call at the hospital. [address] [place], [place], [place]  [address] ? [address], [place], [place]   [address] [phone] ? [phone] ? FAX [phone]

## 2019-07-07 NOTE — Consult Note (Addendum)
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 along with Kathyrn Drown NP-C.  I have reviewed Maria chart, notes and new data.  I agree with PA/NP's note.  Key new complaints: no recent angina or revascularization procedures; mild (NYHA 2) chronic exertional dyspnea attributable to reactive airway disease, responds to bronchodilators. Key examination changes: normal CV exam. Looks a little "dry". She has not taken clopidogrel in 6 days, due to Maria GI symptoms. Key new findings / data: October ECG shows NSR, RBBB+LAFB. NSR on telemetry today. No   PLAN: Known extensive CAD and CHF history, but currently well compensated and asymptomatic, good functional status. Appears euvolemic, maybe even a little "dry". Off clopidogrel > 5 days, which reduces bleeding risk. Have ordered a STAT  ECG, but barring any surprising findings, it should have little impact on preop decision making. Low-moderate risk of perioperative CV complications (mildly increased risk due to DM, urgent nature of Maria procedure and her extensive CV history). Please do not interrupt beta blockers in Maria periop period. Will check on her postop.  Sanda Klein, MD, [place] [phone] 07/07/2019, 12:21 PM
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
## 2019-07-07 NOTE — Transfer of Care (Signed)
Immediate Anesthesia Transfer of Care Note  Patient: Maria Fox  Procedure(s) Performed: LAPAROSCOPIC CHOLECYSTECTOMY (N/A Abdomen)  Patient Location: PACU  Anesthesia Type:General  Level of Consciousness: awake, alert  and oriented  Airway & Oxygen Therapy: Patient Spontanous Breathing and Patient connected to nasal cannula oxygen  Post-op Assessment: Report given to RN, Post -op Vital signs reviewed and stable and Patient moving all extremities X 4  Post vital signs: Reviewed and stable  Last Vitals:  Vitals Value Taken Time  BP 160/86 07/07/19 1443  Temp    Pulse 79 07/07/19 1444  Resp 19 07/07/19 1444  SpO2 98 % 07/07/19 1444  Vitals shown include unvalidated device data.  Last Pain:  Vitals:   07/07/19 1312  TempSrc: Oral  PainSc:          Complications: No apparent anesthesia complications

## 2019-07-07 NOTE — ED Notes (Signed)
Pt transported to CT via stretcher.  

## 2019-07-07 NOTE — ED Notes (Signed)
Pt transported to US via stretcher.  

## 2019-07-07 NOTE — Anesthesia Preprocedure Evaluation (Addendum)
Anesthesia Evaluation  Patient identified by MRN, date of birth, ID band Patient awake    Reviewed: Allergy & Precautions, Patient's Chart, lab work & pertinent test results  Airway Mallampati: I  TM Distance: >3 FB Neck ROM: full    Dental  (+) Dental Advidsory Given, Teeth Intact   Pulmonary neg pulmonary ROS,    Pulmonary exam normal        Cardiovascular hypertension, + CAD, + Cardiac Stents, + CABG and +CHF   Rhythm:Regular Rate:Normal     Neuro/Psych PSYCHIATRIC DISORDERS Depression CVA    GI/Hepatic Neg liver ROS, GERD  ,  Endo/Other  diabetes  Renal/GU Renal InsufficiencyRenal disease     Musculoskeletal negative musculoskeletal ROS (+)   Abdominal Normal abdominal exam  (+)   Peds  Hematology negative hematology ROS (+)   Anesthesia Other Findings   Reproductive/Obstetrics                           Lab Results  Component Value Date   WBC 6.3 07/06/2019   HGB 13.8 07/06/2019   HCT 41.2 07/06/2019   MCV 91.8 07/06/2019   PLT 226 07/06/2019   Lab Results  Component Value Date   CREATININE 1.11 (H) 07/06/2019   BUN 11 07/06/2019   NA 134 (L) 07/06/2019   K 4.1 07/06/2019   CL 102 07/06/2019   CO2 21 (L) 07/06/2019   Lab Results  Component Value Date   INR 1.05 07/25/2015   INR 0.90 07/01/2015   INR 1.08 07/08/2014   Myocardial Perfusion  Nuclear stress EF: 65%.  There was no ST segment deviation noted during stress.  No T wave inversion was noted during stress.  Findings consistent with prior myocardial infarction.  This is a low risk study.  The left ventricular ejection fraction is normal (55-65%).  Anesthesia Physical Anesthesia Plan  ASA: III  Anesthesia Plan: General   Post-op Pain Management:    Induction: Intravenous  PONV Risk Score and Plan: 4 or greater and Ondansetron and Treatment may vary due to age or medical condition  Airway  Management Planned: Oral ETT  Additional Equipment: None  Intra-op Plan:   Post-operative Plan: Extubation in OR  Informed Consent:     Dental Advisory Given  Plan Discussed with: CRNA, Anesthesiologist and Surgeon  Anesthesia Plan Comments:        Anesthesia Quick Evaluation

## 2019-07-07 NOTE — Anesthesia Procedure Notes (Signed)
Procedure Name: Intubation Date/Time: 07/07/2019 1:54 PM Performed by: Neldon Newport, CRNA Pre-anesthesia Checklist: Timeout performed, Patient being monitored, Suction available, Emergency Drugs available and Patient identified Patient Re-evaluated:Patient Re-evaluated prior to induction Oxygen Delivery Method: Circle system utilized Preoxygenation: Pre-oxygenation with 100% oxygen Induction Type: IV induction Ventilation: Mask ventilation without difficulty and Oral airway inserted - appropriate to patient size Laryngoscope Size: Mac and 3 Grade View: Grade I Tube type: Oral Number of attempts: 1 Placement Confirmation: ETT inserted through vocal cords under direct vision,  positive ETCO2 and breath sounds checked- equal and bilateral Secured at: 20 cm Tube secured with: Tape Dental Injury: Teeth and Oropharynx as per pre-operative assessment

## 2019-07-08 ENCOUNTER — Other Ambulatory Visit: Payer: Self-pay | Admitting: Family Medicine

## 2019-07-08 DIAGNOSIS — I2583 Coronary atherosclerosis due to lipid rich plaque: Secondary | ICD-10-CM

## 2019-07-08 DIAGNOSIS — R739 Hyperglycemia, unspecified: Secondary | ICD-10-CM

## 2019-07-08 DIAGNOSIS — I25118 Atherosclerotic heart disease of native coronary artery with other forms of angina pectoris: Secondary | ICD-10-CM | POA: Diagnosis not present

## 2019-07-08 DIAGNOSIS — I5032 Chronic diastolic (congestive) heart failure: Secondary | ICD-10-CM | POA: Diagnosis not present

## 2019-07-08 DIAGNOSIS — E1149 Type 2 diabetes mellitus with other diabetic neurological complication: Secondary | ICD-10-CM | POA: Diagnosis not present

## 2019-07-08 DIAGNOSIS — E785 Hyperlipidemia, unspecified: Secondary | ICD-10-CM

## 2019-07-08 DIAGNOSIS — E1165 Type 2 diabetes mellitus with hyperglycemia: Secondary | ICD-10-CM | POA: Diagnosis not present

## 2019-07-08 DIAGNOSIS — E1169 Type 2 diabetes mellitus with other specified complication: Secondary | ICD-10-CM

## 2019-07-08 DIAGNOSIS — I2510 Atherosclerotic heart disease of native coronary artery without angina pectoris: Secondary | ICD-10-CM

## 2019-07-08 LAB — CBC
HCT: 39 % (ref 36.0–46.0)
Hemoglobin: 13 g/dL (ref 12.0–15.0)
MCH: 29.7 pg (ref 26.0–34.0)
MCHC: 33.3 g/dL (ref 30.0–36.0)
MCV: 89 fL (ref 80.0–100.0)
Platelets: 204 10*3/uL (ref 150–400)
RBC: 4.38 MIL/uL (ref 3.87–5.11)
RDW: 12.9 % (ref 11.5–15.5)
WBC: 12.2 10*3/uL — ABNORMAL HIGH (ref 4.0–10.5)
nRBC: 0 % (ref 0.0–0.2)

## 2019-07-08 LAB — BASIC METABOLIC PANEL
Anion gap: 10 (ref 5–15)
BUN: 9 mg/dL (ref 8–23)
CO2: 20 mmol/L — ABNORMAL LOW (ref 22–32)
Calcium: 8.5 mg/dL — ABNORMAL LOW (ref 8.9–10.3)
Chloride: 104 mmol/L (ref 98–111)
Creatinine, Ser: 1.16 mg/dL — ABNORMAL HIGH (ref 0.44–1.00)
GFR calc Af Amer: 53 mL/min — ABNORMAL LOW (ref >60)
GFR calc non Af Amer: 46 mL/min — ABNORMAL LOW (ref >60)
Glucose, Bld: 368 mg/dL — ABNORMAL HIGH (ref 70–99)
Potassium: 4.3 mmol/L (ref 3.5–5.1)
Sodium: 134 mmol/L — ABNORMAL LOW (ref 135–145)

## 2019-07-08 LAB — GLUCOSE, CAPILLARY
Glucose-Capillary: 273 mg/dL — ABNORMAL HIGH (ref 70–99)
Glucose-Capillary: 302 mg/dL — ABNORMAL HIGH (ref 70–99)
Glucose-Capillary: 193 mg/dL — ABNORMAL HIGH (ref 70–99)
Glucose-Capillary: 250 mg/dL — ABNORMAL HIGH (ref 70–99)

## 2019-07-08 MED ORDER — ONDANSETRON HCL 4 MG PO TABS
4.0000 mg | ORAL_TABLET | Freq: Four times a day (QID) | ORAL | 0 refills | Status: DC
Start: 2019-07-08 — End: 2019-09-29

## 2019-07-08 MED ORDER — HYDROCODONE-ACETAMINOPHEN 5-325 MG PO TABS: 1.0000 | ORAL_TABLET | Freq: Four times a day (QID) | ORAL | 0 refills | Status: DC | PRN

## 2019-07-08 NOTE — Progress Notes (Signed)
Inpatient Diabetes Program Recommendations  AACE/ADA: New Consensus Statement on Inpatient Glycemic Control (2015)  Target Ranges:  Prepandial:   less than 140 mg/dL      Peak postprandial:   less than 180 mg/dL (1-2 hours)      Critically ill patients:  140 - 180 mg/dL   Lab Results  Component Value Date   GLUCAP 302 (H) 07/08/2019   HGBA1C 11.0 (A) 03/15/2019  Results for IYMONA, NEWFIELD (MRN [id number]) as of 07/08/2019 14:21  Ref. Range 07/07/2019 13:35 07/07/2019 14:46 07/07/2019 21:14 07/08/2019 07:48 07/08/2019 12:26  Glucose-Capillary Latest Ref Range: 70 - 99 mg/dL 185 (H) 154 (H) 357 (H) 273 (H) 302 (H)    Review of Glycemic Control  Diabetes history: type 2 Outpatient Diabetes medications: glucotrol 5 mg BID, Metformin 500 mg TID Current orders for Inpatient glycemic control: Novolog MODERATE correction scale TID & HS  Inpatient Diabetes Program Recommendations:   Noted that blood sugars have been greater than 200 mg/dl. Recommend adding home Glucotrol dosage of 5 mg BID. This may help the postprandial blood sugars while in the hospital.  Harvel Ricks RN BSN CDE Diabetes Coordinator Pager: [phone]  8am-5pm

## 2019-07-08 NOTE — Progress Notes (Addendum)
Progress Note  Patient Name: Maria Fox Date of Encounter: 07/08/2019  Primary Cardiologist: Fransico Him, MD   Subjective   Patient underwent lap chole yesterday. She is feeling better today, eating breakfast during interview. Denies CP, sob, palpitations.   Inpatient Medications    Scheduled Meds: . atorvastatin  40 mg Oral Daily  . docusate sodium  100 mg Oral BID  . gabapentin  100 mg Oral TID  . insulin aspart  0-15 Units Subcutaneous TID WC  . insulin aspart  0-5 Units Subcutaneous QHS  . isosorbide mononitrate  15 mg Oral Daily  . latanoprost  1 drop Both Eyes QHS  . metoprolol tartrate  12.5 mg Oral BID   Continuous Infusions: . sodium chloride Stopped (07/07/19 1312)   PRN Meds: acetaminophen **OR** acetaminophen, albuterol, bisacodyl, hydrALAZINE, HYDROcodone-acetaminophen, morphine injection, ondansetron (ZOFRAN) IV, polyethylene glycol, traZODone   Vital Signs    Vitals:   07/07/19 2109 07/08/19 0207 07/08/19 0517 07/08/19 0752  BP: 139/78 118/64 130/72 (!) 105/48  Pulse: 73 66 75 67  Resp: 16 15 15 18   Temp: (!) 97.5 F (36.4 C) 98 F (36.7 C) (!) 97.5 F (36.4 C) 97.8 F (36.6 C)  TempSrc: Oral Oral Oral   SpO2: 95% 94% 100% 93%  Weight:      Height:        Intake/Output Summary (Last 24 hours) at 07/08/2019 0825 Last data filed at 07/08/2019 0517 Gross per 24 hour  Intake 2132.85 ml  Output 5 ml  Net 2127.85 ml   Last 3 Weights 07/06/2019 04/08/2019 03/15/2019  Weight (lbs) 135 lb 154 lb 154 lb 9.6 oz  Weight (kg) 61.236 kg 69.854 kg 70.126 kg      Telemetry    N/A - Personally Reviewed  ECG    Ordered- Personally Reviewed  Physical Exam   GEN: No acute distress.   Neck: No JVD Cardiac: RRR, no murmurs, rubs, or gallops.  Respiratory: Clear to auscultation bilaterally. GI: Soft, nontender, non-distended  MS: No edema; No deformity. Neuro:  Nonfocal  Psych: Normal affect   Labs    High Sensitivity Troponin:  No results for  input(s): TROPONINIHS in the last 720 hours.    Chemistry Recent Labs  Lab 07/06/19 1906 07/08/19 0136  NA 134* 134*  K 4.1 4.3  CL 102 104  CO2 21* 20*  GLUCOSE 301* 368*  BUN 11 9  CREATININE 1.11* 1.16*  CALCIUM 9.1 8.5*  PROT 6.5  --   ALBUMIN 3.6  --   AST 20  --   ALT 23  --   ALKPHOS 107  --   BILITOT 0.6  --   GFRNONAA 48* 46*  GFRAA 56* 53*  ANIONGAP 11 10     Hematology Recent Labs  Lab 07/06/19 1906 07/08/19 0136  WBC 6.3 12.2*  RBC 4.49 4.38  HGB 13.8 13.0  HCT 41.2 39.0  MCV 91.8 89.0  MCH 30.7 29.7  MCHC 33.5 33.3  RDW 13.0 12.9  PLT 226 204    BNPNo results for input(s): BNP, PROBNP in the last 168 hours.   DDimer No results for input(s): DDIMER in the last 168 hours.   Radiology    CT ABDOMEN PELVIS W CONTRAST  Result Date: 07/07/2019 CLINICAL DATA:  Generalized abdominal pain for 1 week EXAM: CT ABDOMEN AND PELVIS WITH CONTRAST TECHNIQUE: Multidetector CT imaging of the abdomen and pelvis was performed using the standard protocol following bolus administration of intravenous contrast. CONTRAST:  14mL OMNIPAQUE IOHEXOL 300 MG/ML  SOLN COMPARISON:  November 23, 2017 FINDINGS: Lower chest: The visualized heart size within normal limits. No pericardial fluid/thickening. No hiatal hernia. The visualized portions of the lungs are clear. Hepatobiliary: There is mildly diffuse low density seen throughout the liver parenchyma. The portal vein appears to be patent. There is diffuse hyperenhancement of the gallbladder wall. Layering calcified gallstones are seen within the gallbladder neck. No intrahepatic biliary ductal dilatation are seen. Pancreas: Unremarkable. No pancreatic ductal dilatation or surrounding inflammatory changes. Spleen: Normal in size.  Scattered splenic calcifications are noted. Adrenals/Urinary Tract: Both adrenal glands appear normal. The kidneys and collecting system appear normal without evidence of urinary tract calculus or  hydronephrosis. Bladder is unremarkable. Stomach/Bowel: The stomach and small bowel are normal in appearance. Scattered colonic diverticula are seen. The appendix is normal in appearance. Vascular/Lymphatic: There are no enlarged mesenteric, retroperitoneal, or pelvic lymph nodes. Scattered aortic atherosclerotic calcifications are seen without aneurysmal dilatation. Reproductive: The uterus and adnexa are unremarkable. Other: No evidence of abdominal wall mass or hernia. Musculoskeletal: No acute or significant osseous findings. IMPRESSION: Diffuse hyperenhancement of the gallbladder wall with gallstones in the gallbladder neck. No definite surrounding inflammatory changes however are seen. Diverticulosis without diverticulitis. Aortic Atherosclerosis (ICD10-I70.0). Electronically Signed   By: Prudencio Pair M.D.   On: 07/07/2019 03:36   US ABDOMEN LIMITED RUQ  Result Date: 07/07/2019 CLINICAL DATA:  Gallstones and abdominal pain for 2 weeks EXAM: ULTRASOUND ABDOMEN LIMITED RIGHT UPPER QUADRANT COMPARISON:  CT same day FINDINGS: Gallbladder: There are multiple layering gallstones seen within the gallbladder neck. The largest measuring 1.1 cm. There is also layering gallbladder sludge. A positive sonographic Percell Miller sign is present. The gallbladder wall thickness measures 4.3 mm. No pericholecystic fluid seen. Common bile duct: Diameter: 4.6 mm. Liver: Increased echotexture seen throughout. No focal abnormality or biliary ductal dilatation. Portal vein is patent on color Doppler imaging with normal direction of blood flow towards the liver. Other: None. IMPRESSION: Findings which could be suggestive of mild acute cholecystitis. Hepatic steatosis Electronically Signed   By: Prudencio Pair M.D.   On: 07/07/2019 04:56    Cardiac Studies     Patient Profile     74 y.o. female with a hx of CAD s/p ASCAD/NSTEMI with subsequent three-vessel CABG in 2016, chronic RBBB, repeat LHC 2019 with occluded LAD and  significant disease in the mid LCx with PCI to LCx 11/2017 (LIMA to LAD-patent, SVG to OM 3-occluded, SVG to diagonal could not be found however was assumed to be occluded as well), HLD, HTN, DM 2, chronic diastolic dysfunction noted not to be on diuretics secondary to orthostatic hypotension, CVA in 2016, and autoimmune deficiency syndrome who is being seen today for preoperative evaluation at the request of Dr. Grandville Silos  Assessment & Plan    Preoperative clearance for acute cholecystitis/cholilithiasis - Found to have acute cholecystitis/cholilithiasis on imaging in the ED.  - Patient with cardiac h/o of CAD back to 2016 with ASCAD/NSTEMI with subsequent CABGx3 in 2019. LHC later that year showed occluded min LAD and significant disease in the mid LCx and underwent DES/PCI to LCx, noted to have patent LIMA to LAD graft, occluded SVG to OM 3 and presumed occluded SVG to Diag. Repeat nuclear stress test 12/2018 with no new areas of ischemia - Patient has chronic SOB followed by pulmonary which is improved with inhalers - Denies recurrent CP,LLE, orthopnea.  - Can perform ADLS however has relatively sedentary lifestyle - Euvolemic on  exam - EKG ordered but not performed. Will check on this - Patient at low-moderate perioperative cardiac risk for surgery. General surgery performed lap chole yesterday. Resume plavix and aspirin.   H/o of CAD - continue statin, Imdur, BB - ASA and Plavix held for procedure>>resume when cleared by surgery  HLD - LDL 22 - continue statin  COPD - followed by pulmonology    For questions or updates, please contact [place] Please consult [email or web address] for contact info under        Signed, Cadence Ninfa Meeker, PA-C  07/08/2019, 8:25 AM    I have seen and examined the patient along with Cadence Ninfa Meeker, PA-C .  I have reviewed the chart, notes and new data.  I agree with PA/NP's note.  [facility] will sign off.   Medication Recommendations: Resume  aspirin 81 mg daily and clopidogrel 75 mg daily as soon as safe from a surgical point of view; resumefurosemide 20 mg daily once normal p.o. intake is reestablished; atorvastatin 40 mg daily, isosorbide mononitrate 30 mg daily, metoprolol 12.5 mg twice daily Other recommendations (labs, testing, etc):  n/a Follow up as an outpatient: She last saw Dr. Golden Hurter in October 2020, with a plan for follow-up in 1 year.  Unless she develops new cardiac problems in the interim, would keep the same schedule  Sanda Klein, MD, [place] [phone] 07/08/2019, 1:53 PM

## 2019-07-08 NOTE — Anesthesia Postprocedure Evaluation (Signed)
Anesthesia Post Note  Patient: Maria Fox  Procedure(s) Performed: LAPAROSCOPIC CHOLECYSTECTOMY (N/A Abdomen)     Patient location during evaluation: PACU Anesthesia Type: General Level of consciousness: awake and alert Pain management: pain level controlled Vital Signs Assessment: post-procedure vital signs reviewed and stable Respiratory status: spontaneous breathing, nonlabored ventilation, respiratory function stable and patient connected to nasal cannula oxygen Cardiovascular status: blood pressure returned to baseline and stable Postop Assessment: no apparent nausea or vomiting Anesthetic complications: no    Last Vitals:  Vitals:   07/08/19 0207 07/08/19 0517  BP: 118/64 130/72  Pulse: 66 75  Resp: 15 15  Temp: 36.7 C (!) 36.4 C  SpO2: 94% 100%    Last Pain:  Vitals:   07/08/19 0613  TempSrc:   PainSc: Asleep                 Effie Berkshire

## 2019-07-08 NOTE — Progress Notes (Signed)
PROGRESS NOTE  Maria Fox [name] DOB: 07-07-1945 DOA: 07/06/2019 PCP: [name], MD   LOS: 1 day   Brief Narrative / Interim history: 74 year old female with HTN, HLD, DM, history of CVA, CAD status post CABG, came into the hospital with abdominal pain, mainly on the right side which started about 1 to 2 weeks ago.  This is associated with nausea and vomiting.  In the ED she was diagnosed with acute cholecystitis and general surgery took her to the OR shortly after admission on 4/28  Subjective / 24h Interval events: She is doing okay this morning, complains of abdominal soreness more than she expected, also complains of significant bloating and tells me she has not been able to pass any gas or had a bowel movement since the surgery  Assessment & Plan: Principal Problem Acute cholecystitis-General surgery consulted, she is status post laparoscopic cholecystectomy on 4/28.  She seems to be recovering well postop, however having discomfort and increased soreness today.  Have asked to ambulate.  She seems to be tolerating a diet this morning  Active Problems Essential hypertension-continue home medications  Type 2 diabetes mellitus-cover with sliding scale  Hyperlipidemia-continue atorvastatin  Coronary artery disease, history of chronic diastolic CHF, history of CABG-cardiology consulted for preoperative clearance, she seems to be tolerating surgery well.  Chronic kidney disease stage IIIa-creatinine appears stable today   Scheduled Meds: . atorvastatin  40 mg Oral Daily  . docusate sodium  100 mg Oral BID  . gabapentin  100 mg Oral TID  . insulin aspart  0-15 Units Subcutaneous TID WC  . insulin aspart  0-5 Units Subcutaneous QHS  . isosorbide mononitrate  15 mg Oral Daily  . latanoprost  1 drop Both Eyes QHS  . metoprolol tartrate  12.5 mg Oral BID   Continuous Infusions: . sodium chloride 75 mL/hr at 07/08/19 0947   PRN Meds:.acetaminophen **OR**  acetaminophen, albuterol, bisacodyl, hydrALAZINE, HYDROcodone-acetaminophen, morphine injection, ondansetron (ZOFRAN) IV, polyethylene glycol, traZODone  DVT prophylaxis: SCD Code Status: Full code Family Communication: no family at bedside  Patient admitted from: home Barriers to d/c: Has not passed gas or had bowel movement, has not ambulated postop Status is: Inpatient  Remains inpatient appropriate because:Inpatient level of care appropriate due to severity of illness   Dispo: The patient is from: Home              Anticipated d/c is to: Home              Anticipated d/c date is: 1 day              Patient currently is not medically stable to d/c.  Consultants:  General surgery Cardiology  Procedures:  None   Microbiology  None   Antimicrobials: None     Objective: Vitals:   07/07/19 2109 07/08/19 0207 07/08/19 0517 07/08/19 0752  BP: 139/78 118/64 130/72 (!) 105/48  Pulse: 73 66 75 67  Resp: 16 15 15 18   Temp: (!) 97.5 F (36.4 C) 98 F (36.7 C) (!) 97.5 F (36.4 C) 97.8 F (36.6 C)  TempSrc: Oral Oral Oral   SpO2: 95% 94% 100% 93%  Weight:      Height:        Intake/Output Summary (Last 24 hours) at 07/08/2019 1145 Last data filed at 07/08/2019 1100 Gross per 24 hour  Intake 2612.85 ml  Output 5 ml  Net 2607.85 ml   Filed Weights   07/06/19 1842  Weight: 61.2 kg  Examination:  Constitutional: NAD Eyes: no scleral icterus ENMT: Mucous membranes are moist.  Respiratory: clear to auscultation bilaterally, no wheezing, no crackles. Normal respiratory effort. No accessory muscle use.  Cardiovascular: Regular rate and rhythm, no murmurs / rubs / gallops. Abdomen: non distended. Bowel sounds positive.  Musculoskeletal: no clubbing / cyanosis.  Skin: no rashes Neurologic: CN 2-12 grossly intact. Strength 5/5 in all 4.   Data Reviewed: I have independently reviewed following labs and imaging studies   CBC: Recent Labs  Lab 07/06/19 1906  07/08/19 0136  WBC 6.3 12.2*  HGB 13.8 13.0  HCT 41.2 39.0  MCV 91.8 89.0  PLT 226 0000000   Basic Metabolic Panel: Recent Labs  Lab 07/06/19 1906 07/08/19 0136  NA 134* 134*  K 4.1 4.3  CL 102 104  CO2 21* 20*  GLUCOSE 301* 368*  BUN 11 9  CREATININE 1.11* 1.16*  CALCIUM 9.1 8.5*   Liver Function Tests: Recent Labs  Lab 07/06/19 1906  AST 20  ALT 23  ALKPHOS 107  BILITOT 0.6  PROT 6.5  ALBUMIN 3.6   Coagulation Profile: No results for input(s): INR, PROTIME in the last 168 hours. HbA1C: No results for input(s): HGBA1C in the last 72 hours. CBG: Recent Labs  Lab 07/07/19 1248 07/07/19 1335 07/07/19 1446 07/07/19 2114 07/08/19 0748  GLUCAP 207* 185* 154* 357* 273*    Recent Results (from the past 240 hour(s))  Culture, blood (Routine X 2) w Reflex to ID Panel     Status: None (Preliminary result)   Collection Time: 07/07/19  2:50 AM   Specimen: BLOOD  Result Value Ref Range Status   Specimen Description BLOOD LEFT ARM  Final   Special Requests   Final    BOTTLES DRAWN AEROBIC AND ANAEROBIC Blood Culture results may not be optimal due to an excessive volume of blood received in culture bottles   Culture   Final    NO GROWTH 1 DAY Performed at [facility], [place] [address], [place], [place] [address]    Report Status PENDING  Incomplete  Culture, blood (Routine X 2) w Reflex to ID Panel     Status: None (Preliminary result)   Collection Time: 07/07/19  3:10 AM   Specimen: BLOOD  Result Value Ref Range Status   Specimen Description BLOOD RIGHT FOREARM  Final   Special Requests   Final    BOTTLES DRAWN AEROBIC ONLY Blood Culture results may not be optimal due to an inadequate volume of blood received in culture bottles   Culture   Final    NO GROWTH 1 DAY Performed at [facility], [place] [address], [place], [place] [address]    Report Status PENDING  Incomplete  Respiratory Panel by RT PCR (Flu A&B, Covid) - Nasopharyngeal Swab     Status:  None   Collection Time: 07/07/19  6:35 AM   Specimen: Nasopharyngeal Swab  Result Value Ref Range Status   SARS Coronavirus 2 by RT PCR NEGATIVE NEGATIVE Final    Comment: (NOTE) SARS-CoV-2 target nucleic acids are NOT DETECTED. The SARS-CoV-2 RNA is generally detectable in upper respiratoy specimens during the acute phase of infection. The lowest concentration of SARS-CoV-2 viral copies this assay can detect is 131 copies/mL. A negative result does not preclude SARS-Cov-2 infection and should not be used as the sole basis for treatment or other patient management decisions. A negative result may occur with  improper specimen collection/handling, submission of specimen other than  nasopharyngeal swab, presence of viral mutation(s) within the areas targeted by this assay, and inadequate number of viral copies (<131 copies/mL). A negative result must be combined with clinical observations, patient history, and epidemiological information. The expected result is Negative. Fact Sheet for Patients:  PinkCheek.be Fact Sheet for Healthcare Providers:  GravelBags.it This test is not yet ap proved or cleared by the [place] FDA and  has been authorized for detection and/or diagnosis of SARS-CoV-2 by FDA under an Emergency Use Authorization (EUA). This EUA will remain  in effect (meaning this test can be used) for the duration of the COVID-19 declaration under Section 564(b)(1) of the Act, 21 U.S.C. section 360bbb-3(b)(1), unless the authorization is terminated or revoked sooner.    Influenza A by PCR NEGATIVE NEGATIVE Final   Influenza B by PCR NEGATIVE NEGATIVE Final    Comment: (NOTE) The Xpert Xpress SARS-CoV-2/FLU/RSV assay is intended as an aid in  the diagnosis of influenza from Nasopharyngeal swab specimens and  should not be used as a sole basis for treatment. Nasal washings and  aspirates are unacceptable for Xpert  Xpress SARS-CoV-2/FLU/RSV  testing. Fact Sheet for Patients: PinkCheek.be Fact Sheet for Healthcare Providers: GravelBags.it This test is not yet approved or cleared by the [place] FDA and  has been authorized for detection and/or diagnosis of SARS-CoV-2 by  FDA under an Emergency Use Authorization (EUA). This EUA will remain  in effect (meaning this test can be used) for the duration of the  Covid-19 declaration under Section 564(b)(1) of the Act, 21  U.S.C. section 360bbb-3(b)(1), unless the authorization is  terminated or revoked. Performed at [facility], [place] [address] [place], [place] [address]      Radiology Studies: No results found.  Marzetta Board, MD, PhD [facility]  Between 7 am - 7 pm I am available, please contact me via Amion or Securechat  Between 7 pm - 7 am I am not available, please contact night coverage MD/APP via Amion

## 2019-07-08 NOTE — Progress Notes (Signed)
1 Day Post-Op  Subjective: CC: Doing well.  Tolerating clear liquids.  Has her heart healthy tray in front of her and just starting breakfast.  She reports her pain is moderate and mainly around her upper abdominal incision.  She denies any nausea or vomiting (although did take a zofran this am with her pain medication).  She has not gotten out of bed yet.  Objective: Vital signs in last 24 hours: Temp:  [97.5 F (36.4 C)-98.7 F (37.1 C)] 97.8 F (36.6 C) (04/29 0752) Pulse Rate:  [66-90] 67 (04/29 0752) Resp:  [11-25] 18 (04/29 0752) BP: (105-167)/(48-87) 105/48 (04/29 0752) SpO2:  [93 %-100 %] 93 % (04/29 0752) Last BM Date: 07/05/19  Intake/Output from previous day: 04/28 0701 - 04/29 0700 In: 2132.9 [P.O.:240; I.V.:1892.9] Out: 5 [Blood:5] Intake/Output this shift: Total I/O In: 240 [P.O.:240] Out: -   PE: Gen:  Alert, NAD, pleasant Lungs: Normal rate and effort  Abd: Soft, mild lower abdominal distension, appropriately tender around laparoscopic incisions without rigidity or rebound, +BS. Incisions with glue intact appears well and are without drainage, bleeding, or signs of infection Ext:  No LE edema  Psych: A&Ox3  Skin: no rashes noted, warm and dry  Lab Results:  Recent Labs    07/06/19 1906 07/08/19 0136  WBC 6.3 12.2*  HGB 13.8 13.0  HCT 41.2 39.0  PLT 226 204   BMET Recent Labs    07/06/19 1906 07/08/19 0136  NA 134* 134*  K 4.1 4.3  CL 102 104  CO2 21* 20*  GLUCOSE 301* 368*  BUN 11 9  CREATININE 1.11* 1.16*  CALCIUM 9.1 8.5*   PT/INR No results for input(s): LABPROT, INR in the last 72 hours. CMP     Component Value Date/Time   NA 134 (L) 07/08/2019 0136   NA 140 12/11/2017 1027   K 4.3 07/08/2019 0136   CL 104 07/08/2019 0136   CO2 20 (L) 07/08/2019 0136   GLUCOSE 368 (H) 07/08/2019 0136   BUN 9 07/08/2019 0136   BUN 15 12/11/2017 1027   CREATININE 1.16 (H) 07/08/2019 0136   CALCIUM 8.5 (L) 07/08/2019 0136   PROT 6.5  07/06/2019 1906   PROT 6.1 10/17/2017 0927   ALBUMIN 3.6 07/06/2019 1906   ALBUMIN 4.0 10/17/2017 0927   AST 20 07/06/2019 1906   ALT 23 07/06/2019 1906   ALKPHOS 107 07/06/2019 1906   BILITOT 0.6 07/06/2019 1906   BILITOT 0.3 10/17/2017 0927   GFRNONAA 46 (L) 07/08/2019 0136   GFRAA 53 (L) 07/08/2019 0136   Lipase     Component Value Date/Time   LIPASE 29 07/06/2019 1906       Studies/Results: CT ABDOMEN PELVIS W CONTRAST  Result Date: 07/07/2019 CLINICAL DATA:  Generalized abdominal pain for 1 week EXAM: CT ABDOMEN AND PELVIS WITH CONTRAST TECHNIQUE: Multidetector CT imaging of the abdomen and pelvis was performed using the standard protocol following bolus administration of intravenous contrast. CONTRAST:  137mL OMNIPAQUE IOHEXOL 300 MG/ML  SOLN COMPARISON:  November 23, 2017 FINDINGS: Lower chest: The visualized heart size within normal limits. No pericardial fluid/thickening. No hiatal hernia. The visualized portions of the lungs are clear. Hepatobiliary: There is mildly diffuse low density seen throughout the liver parenchyma. The portal vein appears to be patent. There is diffuse hyperenhancement of the gallbladder wall. Layering calcified gallstones are seen within the gallbladder neck. No intrahepatic biliary ductal dilatation are seen. Pancreas: Unremarkable. No pancreatic ductal dilatation or surrounding inflammatory changes.  Spleen: Normal in size.  Scattered splenic calcifications are noted. Adrenals/Urinary Tract: Both adrenal glands appear normal. The kidneys and collecting system appear normal without evidence of urinary tract calculus or hydronephrosis. Bladder is unremarkable. Stomach/Bowel: The stomach and small bowel are normal in appearance. Scattered colonic diverticula are seen. The appendix is normal in appearance. Vascular/Lymphatic: There are no enlarged mesenteric, retroperitoneal, or pelvic lymph nodes. Scattered aortic atherosclerotic calcifications are seen  without aneurysmal dilatation. Reproductive: The uterus and adnexa are unremarkable. Other: No evidence of abdominal wall mass or hernia. Musculoskeletal: No acute or significant osseous findings. IMPRESSION: Diffuse hyperenhancement of the gallbladder wall with gallstones in the gallbladder neck. No definite surrounding inflammatory changes however are seen. Diverticulosis without diverticulitis. Aortic Atherosclerosis (ICD10-I70.0). Electronically Signed   By: Prudencio Pair M.D.   On: 07/07/2019 03:36   US ABDOMEN LIMITED RUQ  Result Date: 07/07/2019 CLINICAL DATA:  Gallstones and abdominal pain for 2 weeks EXAM: ULTRASOUND ABDOMEN LIMITED RIGHT UPPER QUADRANT COMPARISON:  CT same day FINDINGS: Gallbladder: There are multiple layering gallstones seen within the gallbladder neck. The largest measuring 1.1 cm. There is also layering gallbladder sludge. A positive sonographic Percell Miller sign is present. The gallbladder wall thickness measures 4.3 mm. No pericholecystic fluid seen. Common bile duct: Diameter: 4.6 mm. Liver: Increased echotexture seen throughout. No focal abnormality or biliary ductal dilatation. Portal vein is patent on color Doppler imaging with normal direction of blood flow towards the liver. Other: None. IMPRESSION: Findings which could be suggestive of mild acute cholecystitis. Hepatic steatosis Electronically Signed   By: Prudencio Pair M.D.   On: 07/07/2019 04:56    Anti-infectives: Anti-infectives (From admission, onward)   Start     Dose/Rate Route Frequency Ordered Stop   07/08/19 0500  cefTRIAXone (ROCEPHIN) 2 g in sodium chloride 0.9 % 100 mL IVPB  Status:  Discontinued     2 g 200 mL/hr over 30 Minutes Intravenous Every 24 hours 07/07/19 0839 07/07/19 1802   07/07/19 0245  cefTRIAXone (ROCEPHIN) 2 g in sodium chloride 0.9 % 100 mL IVPB     2 g 200 mL/hr over 30 Minutes Intravenous  Once 07/07/19 0231 07/07/19 0451       Assessment/Plan  Hypertension Hx CHF CAD with Hx  CABG/PTCA stents - on Plavix @ home Hx CVA Type 2 diabetes Neuropathy CKD Hx insomnia Iron/B12 deficiencies  Acute cholecystitis - s/p Lap Chole, 4/28, Dr. Ninfa Linden, POD #1 - On POD 1, the patient was voiding well, tolerating a diet, vital signs stable, only taking oral medications and incisions c/d/i.  If patient mobilizes, can be discharged from our standpoint.  I have sent her pain medication to the pharmacy and arranged follow-up.  FEN: HH ID: Rocephin periop. None currently. No further abx indicated DVT: SCDs, okay to start chemical prophylaxis. Can start home medications at discharge from our standpoint Follow-up: DOW   LOS: 1 day    Jillyn Ledger , [facility] Surgery 07/08/2019, 9:17 AM Please see Amion for pager number during day hours 7:00am-4:30pm

## 2019-07-09 ENCOUNTER — Inpatient Hospital Stay (HOSPITAL_COMMUNITY): Payer: Medicare HMO

## 2019-07-09 LAB — COMPREHENSIVE METABOLIC PANEL
ALT: 23 U/L (ref 0–44)
AST: 27 U/L (ref 15–41)
Albumin: 2.5 g/dL — ABNORMAL LOW (ref 3.5–5.0)
Alkaline Phosphatase: 59 U/L (ref 38–126)
Anion gap: 5 (ref 5–15)
BUN: 20 mg/dL (ref 8–23)
CO2: 23 mmol/L (ref 22–32)
Calcium: 8.1 mg/dL — ABNORMAL LOW (ref 8.9–10.3)
Chloride: 107 mmol/L (ref 98–111)
Creatinine, Ser: 1.94 mg/dL — ABNORMAL HIGH (ref 0.44–1.00)
GFR calc Af Amer: 28 mL/min — ABNORMAL LOW (ref >60)
GFR calc non Af Amer: 25 mL/min — ABNORMAL LOW (ref >60)
Glucose, Bld: 279 mg/dL — ABNORMAL HIGH (ref 70–99)
Potassium: 4.1 mmol/L (ref 3.5–5.1)
Sodium: 135 mmol/L (ref 135–145)
Total Bilirubin: 0.6 mg/dL (ref 0.3–1.2)
Total Protein: 5.1 g/dL — ABNORMAL LOW (ref 6.5–8.1)

## 2019-07-09 LAB — CBC
HCT: 33.2 % — ABNORMAL LOW (ref 36.0–46.0)
Hemoglobin: 10.8 g/dL — ABNORMAL LOW (ref 12.0–15.0)
MCH: 29.7 pg (ref 26.0–34.0)
MCHC: 32.5 g/dL (ref 30.0–36.0)
MCV: 91.2 fL (ref 80.0–100.0)
Platelets: 186 10*3/uL (ref 150–400)
RBC: 3.64 MIL/uL — ABNORMAL LOW (ref 3.87–5.11)
RDW: 13.2 % (ref 11.5–15.5)
WBC: 9.6 10*3/uL (ref 4.0–10.5)
nRBC: 0 % (ref 0.0–0.2)

## 2019-07-09 LAB — URINALYSIS, COMPLETE (UACMP) WITH MICROSCOPIC
Bilirubin Urine: NEGATIVE
Glucose, UA: 50 mg/dL — AB
Hgb urine dipstick: NEGATIVE
Ketones, ur: NEGATIVE mg/dL
Nitrite: NEGATIVE
Protein, ur: NEGATIVE mg/dL
Specific Gravity, Urine: 1.015 (ref 1.005–1.030)
pH: 5 (ref 5.0–8.0)

## 2019-07-09 LAB — GLUCOSE, CAPILLARY
Glucose-Capillary: 229 mg/dL — ABNORMAL HIGH (ref 70–99)
Glucose-Capillary: 197 mg/dL — ABNORMAL HIGH (ref 70–99)
Glucose-Capillary: 241 mg/dL — ABNORMAL HIGH (ref 70–99)

## 2019-07-09 LAB — SURGICAL PATHOLOGY

## 2019-07-09 MED ORDER — SODIUM CHLORIDE 0.9 % IV SOLN: INTRAVENOUS | Status: DC

## 2019-07-09 NOTE — Evaluation (Signed)
Physical Therapy Evaluation Patient Details Name: Maria Fox MRN: [id number] DOB: 1946/02/23 Today's Date: 07/09/2019   History of Present Illness  Pt is 74 yo female with HTN, HLD, DM, history of CVA, CAD status post CABG, came into the hospital with abdominal pain, mainly on the right side which started about 1 to 2 weeks ago.  She was diagnosed with acute cholecystitis and is s/p lap chole on 4/28.  Clinical Impression  Pt admitted with above diagnosis.  She presents with slight decrease in mobility due to abd pain from recent surgery.  However, pt did demonstrate safe gait and transfers.  Pt with good 24 hr support at home.  Expected to progress well and return to baseline.  No skilled therapy indicated.  Recommend mobility with nursing or family.     Follow Up Recommendations No PT follow up    Equipment Recommendations  None recommended by PT    Recommendations for Other Services       Precautions / Restrictions Precautions Precautions: None      Mobility  Bed Mobility Overal bed mobility: Needs Assistance Bed Mobility: Supine to Sit;Sit to Supine     Supine to sit: Modified independent (Device/Increase time);HOB elevated Sit to supine: Modified independent (Device/Increase time);HOB elevated   General bed mobility comments: limited due to abd pain from sx  Transfers Overall transfer level: Needs assistance   Transfers: Sit to/from Stand;Stand Pivot Transfers Sit to Stand: Supervision Stand pivot transfers: Supervision       General transfer comment: toileting adls independently  Ambulation/Gait Ambulation/Gait assistance: Supervision Gait Distance (Feet): 300 Feet Assistive device: IV Pole Gait Pattern/deviations: WFL(Within Functional Limits) Gait velocity: normal   General Gait Details: steady; no LOB: able to push IV pole and navigate around obstacles in room  Stairs            Wheelchair Mobility    Modified Rankin (Stroke Patients Only)       Balance Overall balance assessment: No apparent balance deficits (not formally assessed)                                           Pertinent Vitals/Pain Pain Assessment: 0-10 Pain Score: 8  Pain Location: stomach Pain Descriptors / Indicators: Discomfort;Sore Pain Intervention(s): Limited activity within patient's tolerance;Monitored during session    [place] expects to be discharged to:: Private residence Living Arrangements: Spouse/significant other([name] her fiance) Available Help at Discharge: Family;Available 24 hours/day Type of Home: House Home Access: Level entry     Home Layout: One level Home Equipment: Walker - 2 wheels Additional Comments: may have shower seat    Prior Function Level of Independence: Needs assistance   Gait / Transfers Assistance Needed: Reports can do community ambulation but with hip pain ; does not use AD  ADL's / Homemaking Assistance Needed: Reports independent with ADLs and IADLs        Hand Dominance        Extremity/Trunk Assessment   Upper Extremity Assessment Upper Extremity Assessment: Overall WFL for tasks assessed    Lower Extremity Assessment Lower Extremity Assessment: Overall WFL for tasks assessed    Cervical / Trunk Assessment Cervical / Trunk Assessment: Normal  Communication   Communication: No difficulties  Cognition Arousal/Alertness: Awake/alert Behavior During Therapy: WFL for tasks assessed/performed Overall Cognitive Status: Within Functional Limits for tasks assessed  General Comments General comments (skin integrity, edema, etc.): VSS    Exercises     Assessment/Plan    PT Assessment Patent does not need any further PT services  PT Problem List         PT Treatment Interventions      PT Goals (Current goals can be found in the Care Plan section)  Acute Rehab PT Goals Patient Stated Goal:  return home PT Goal Formulation: All assessment and education complete, DC therapy Potential to Achieve Goals: Good    Frequency     Barriers to discharge        Co-evaluation               AM-PAC PT "6 Clicks" Mobility  Outcome Measure Help needed turning from your back to your side while in a flat bed without using bedrails?: None Help needed moving from lying on your back to sitting on the side of a flat bed without using bedrails?: None Help needed moving to and from a bed to a chair (including a wheelchair)?: None Help needed standing up from a chair using your arms (e.g., wheelchair or bedside chair)?: None Help needed to walk in hospital room?: None Help needed climbing 3-5 steps with a railing? : None 6 Click Score: 24    End of Session   Activity Tolerance: Patient tolerated treatment well Patient left: with call bell/phone within reach;in chair(RN present) Nurse Communication: Mobility status(IV was leaking and pt used BSC (did not empty incase needed documentation))      Time: [id number] PT Time Calculation (min) (ACUTE ONLY): 22 min   Charges:   PT Evaluation $PT Eval Low Complexity: 1 Low          Maggie Font, PT Acute Rehab Services Pager [phone] [facility] [phone] [facility] [phone]   Karlton Lemon 07/09/2019, 5:19 PM

## 2019-07-09 NOTE — Progress Notes (Signed)
PROGRESS NOTE  Maria Fox [id number] DOB: 10-07-1945 DOA: 07/06/2019 PCP: [name], MD   LOS: 2 days   Brief Narrative / Interim history: 74 year old female with HTN, HLD, DM, history of CVA, CAD status post CABG, came into the hospital with abdominal pain, mainly on the right side which started about 1 to 2 weeks ago.  This is associated with nausea and vomiting.  In the ED she was diagnosed with acute cholecystitis and general surgery took her to the OR shortly after admission on 4/28  Subjective / 24h Interval events: She feels quite bad this morning, complains of abdominal discomfort, distention.  She has not passed any gas or had any bowel movement since surgery.  Complains of nausea.  Assessment & Plan: Principal Problem Acute cholecystitis-General surgery consulted, she is status post laparoscopic cholecystectomy on 4/28.  Is having significant abdominal discomfort today with nausea, has not passed gas or stool.  Obtain a 2 view abdominal x-ray which is negative for ileus.  Further management per general surgery.  Active Problems Acute kidney injury on chronic kidney disease stage IIIa-creatinine rising today to 1.9, no clear-cut cause, no recorded hypotension, she is not retaining with bladder scan showing only 35 mL, may be dry, will give IV fluids for the next 24 hours and recheck kidney function tomorrow morning.  Essential hypertension-continue home medications with metoprolol at a lower dose, blood pressure stable this morning  Type 2 diabetes mellitus-cover with sliding scale  Hyperlipidemia-continue atorvastatin  Coronary artery disease, history of chronic diastolic CHF, history of CABG-cardiology consulted for preoperative clearance, overall tolerated surgery    Scheduled Meds: . atorvastatin  40 mg Oral Daily  . docusate sodium  100 mg Oral BID  . gabapentin  100 mg Oral TID  . insulin aspart  0-15 Units Subcutaneous TID WC  . insulin aspart  0-5  Units Subcutaneous QHS  . isosorbide mononitrate  15 mg Oral Daily  . latanoprost  1 drop Both Eyes QHS  . metoprolol tartrate  12.5 mg Oral BID   Continuous Infusions: . sodium chloride 100 mL/hr at 07/09/19 0829   PRN Meds:.acetaminophen **OR** acetaminophen, albuterol, bisacodyl, hydrALAZINE, HYDROcodone-acetaminophen, morphine injection, ondansetron (ZOFRAN) IV, polyethylene glycol, traZODone  DVT prophylaxis: SCD Code Status: Full code Family Communication: no family at bedside   Status is: Inpatient  Remains inpatient appropriate because:Inpatient level of care appropriate due to severity of illness and Has significant postoperative abdominal discomfort, has not passed stool or gas   Dispo: The patient is from: Home              Anticipated d/c is to: Home              Anticipated d/c date is: 1 day              Patient currently is not medically stable to d/c.  Consultants:  General surgery Cardiology  Procedures:  None   Microbiology  None   Antimicrobials: None     Objective: Vitals:   07/08/19 1521 07/08/19 1522 07/08/19 2038 07/09/19 0419  BP: (!) 122/102 (!) 103/53 (!) 107/45 (!) 112/57  Pulse: 78 73 76 74  Resp: 16  15 17   Temp: (!) 97.5 F (36.4 C)  98.1 F (36.7 C) 98 F (36.7 C)  TempSrc: Oral  Oral Oral  SpO2: 93% 93% 100% 92%  Weight:      Height:        Intake/Output Summary (Last 24 hours) at 07/09/2019 1047 Last  data filed at 07/09/2019 0942 Gross per 24 hour  Intake 1000 ml  Output --  Net 1000 ml   Filed Weights   07/06/19 1842  Weight: 61.2 kg    Examination:  Constitutional: Appears uncomfortable Eyes: No scleral icterus ENMT: Moist mucous membranes Respiratory: Slightly diminished at the bases but overall clear, no wheezing or crackles heard.  Normal respiratory effort Cardiovascular: Heart is regular, no murmurs appreciated.  No peripheral edema Abdomen: Slightly tender, no guarding, bowel sounds  present Musculoskeletal: no clubbing / cyanosis.  Skin: No new rashes Neurologic: No focal deficits  Data Reviewed: I have independently reviewed following labs and imaging studies   CBC: Recent Labs  Lab 07/06/19 1906 07/08/19 0136 07/09/19 0148  WBC 6.3 12.2* 9.6  HGB 13.8 13.0 10.8*  HCT 41.2 39.0 33.2*  MCV 91.8 89.0 91.2  PLT 226 204 [id number]   Basic Metabolic Panel: Recent Labs  Lab 07/06/19 1906 07/08/19 0136 07/09/19 0148  NA 134* 134* 135  K 4.1 4.3 4.1  CL 102 104 107  CO2 21* 20* 23  GLUCOSE 301* 368* 279*  BUN 11 9 20   CREATININE 1.11* 1.16* 1.94*  CALCIUM 9.1 8.5* 8.1*   Liver Function Tests: Recent Labs  Lab 07/06/19 1906 07/09/19 0148  AST 20 27  ALT 23 23  ALKPHOS 107 59  BILITOT 0.6 0.6  PROT 6.5 5.1*  ALBUMIN 3.6 2.5*   Coagulation Profile: No results for input(s): INR, PROTIME in the last 168 hours. HbA1C: No results for input(s): HGBA1C in the last 72 hours. CBG: Recent Labs  Lab 07/08/19 0748 07/08/19 1226 07/08/19 1734 07/08/19 2034 07/09/19 0741  GLUCAP 273* 302* 193* 250* 229*    Recent Results (from the past 240 hour(s))  Culture, blood (Routine X 2) w Reflex to ID Panel     Status: None (Preliminary result)   Collection Time: 07/07/19  2:50 AM   Specimen: BLOOD  Result Value Ref Range Status   Specimen Description BLOOD LEFT ARM  Final   Special Requests   Final    BOTTLES DRAWN AEROBIC AND ANAEROBIC Blood Culture results may not be optimal due to an excessive volume of blood received in culture bottles   Culture   Final    NO GROWTH 1 DAY Performed at [facility], [place] [address], [place], [place] [address]    Report Status PENDING  Incomplete  Culture, blood (Routine X 2) w Reflex to ID Panel     Status: None (Preliminary result)   Collection Time: 07/07/19  3:10 AM   Specimen: BLOOD  Result Value Ref Range Status   Specimen Description BLOOD RIGHT FOREARM  Final   Special Requests   Final    BOTTLES DRAWN  AEROBIC ONLY Blood Culture results may not be optimal due to an inadequate volume of blood received in culture bottles   Culture   Final    NO GROWTH 1 DAY Performed at [facility], [place] [address], [place], [place] [address]    Report Status PENDING  Incomplete  Respiratory Panel by RT PCR (Flu A&B, Covid) - Nasopharyngeal Swab     Status: None   Collection Time: 07/07/19  6:35 AM   Specimen: Nasopharyngeal Swab  Result Value Ref Range Status   SARS Coronavirus 2 by RT PCR NEGATIVE NEGATIVE Final    Comment: (NOTE) SARS-CoV-2 target nucleic acids are NOT DETECTED. The SARS-CoV-2 RNA is generally detectable in upper respiratoy specimens during the acute phase  of infection. The lowest concentration of SARS-CoV-2 viral copies this assay can detect is 131 copies/mL. A negative result does not preclude SARS-Cov-2 infection and should not be used as the sole basis for treatment or other patient management decisions. A negative result may occur with  improper specimen collection/handling, submission of specimen other than nasopharyngeal swab, presence of viral mutation(s) within the areas targeted by this assay, and inadequate number of viral copies (<131 copies/mL). A negative result must be combined with clinical observations, patient history, and epidemiological information. The expected result is Negative. Fact Sheet for Patients:  PinkCheek.be Fact Sheet for Healthcare Providers:  GravelBags.it This test is not yet ap proved or cleared by the [place] FDA and  has been authorized for detection and/or diagnosis of SARS-CoV-2 by FDA under an Emergency Use Authorization (EUA). This EUA will remain  in effect (meaning this test can be used) for the duration of the COVID-19 declaration under Section 564(b)(1) of the Act, 21 U.S.C. section 360bbb-3(b)(1), unless the authorization is terminated or revoked sooner.     Influenza A by PCR NEGATIVE NEGATIVE Final   Influenza B by PCR NEGATIVE NEGATIVE Final    Comment: (NOTE) The Xpert Xpress SARS-CoV-2/FLU/RSV assay is intended as an aid in  the diagnosis of influenza from Nasopharyngeal swab specimens and  should not be used as a sole basis for treatment. Nasal washings and  aspirates are unacceptable for Xpert Xpress SARS-CoV-2/FLU/RSV  testing. Fact Sheet for Patients: PinkCheek.be Fact Sheet for Healthcare Providers: GravelBags.it This test is not yet approved or cleared by the [place] FDA and  has been authorized for detection and/or diagnosis of SARS-CoV-2 by  FDA under an Emergency Use Authorization (EUA). This EUA will remain  in effect (meaning this test can be used) for the duration of the  Covid-19 declaration under Section 564(b)(1) of the Act, 21  U.S.C. section 360bbb-3(b)(1), unless the authorization is  terminated or revoked. Performed at [facility], [place] [address], [place],  [address]      Radiology Studies: DG Abd 2 Views  Result Date: 07/09/2019 CLINICAL DATA:  Abdominal pain. Ileus. EXAM: ABDOMEN - 2 VIEW COMPARISON:  CT topogram on 04/28 1 FINDINGS: No evidence of dilated bowel loops. Scattered air-fluid levels are noted, which are nonspecific. There is no evidence of free air. Right upper quadrant surgical clips from prior cholecystectomy. No radio-opaque calculi or other significant radiographic abnormality is seen. IMPRESSION: Nonspecific, nonobstructive bowel gas pattern. Electronically Signed   By: Marlaine Hind M.D.   On: 07/09/2019 09:15    Marzetta Board, MD, PhD [facility]  Between 7 am - 7 pm I am available, please contact me via Amion or Securechat  Between 7 pm - 7 am I am not available, please contact night coverage MD/APP via Amion

## 2019-07-09 NOTE — Progress Notes (Signed)
Bladder scan showed 35 mL of urine. Patient voided 100 mL prior scanning.

## 2019-07-09 NOTE — Progress Notes (Signed)
2 Days Post-Op  Subjective: CC: Patient reports she was doing well yesterday.  Able to have roast beef for dinner.  She reports this morning she feels nauseated and bloated.  She denies any emesis to myself but did tell Dr. Cruzita Lederer that she had an episode of emesis this morning.  She is not passing flatus.  No BM since surgery.  She reports her lower abdomen feels distended.  She is urinating but only very small amounts when she goes.  She notes some of her lower abdominal pain radiates to her right flank.  No upper abdominal pain.  She did not get out of bed yesterday.  Objective: Vital signs in last 24 hours: Temp:  [97.5 F (36.4 C)-98.1 F (36.7 C)] 98 F (36.7 C) (04/30 0419) Pulse Rate:  [73-78] 74 (04/30 0419) Resp:  [15-17] 17 (04/30 0419) BP: (103-122)/(45-102) 112/57 (04/30 0419) SpO2:  [92 %-100 %] 92 % (04/30 0419) Last BM Date: 07/05/19  Intake/Output from previous day: 04/29 0701 - 04/30 0700 In: 1000 [P.O.:1000] Out: -  Intake/Output this shift: No intake/output data recorded.  PE: Gen:  Alert, NAD, pleasant Lungs: Normal rate and effort  Abd: Soft, moderate lower abdominal distension, appropriately tender around laparoscopic incisions without rigidity or rebound, no right upper quadrant tenderness.  She does have some suprapubic abdominal tenderness that she winces out but there is no rigidity or rebound.  +BS. Incisions with glue intact appears well and are without drainage, bleeding, or signs of infection Ext:  No LE edema  Psych: A&Ox3  Skin: no rashes noted, warm and dry  Lab Results:  Recent Labs    07/08/19 0136 07/09/19 0148  WBC 12.2* 9.6  HGB 13.0 10.8*  HCT 39.0 33.2*  PLT 204 186   BMET Recent Labs    07/08/19 0136 07/09/19 0148  NA 134* 135  K 4.3 4.1  CL 104 107  CO2 20* 23  GLUCOSE 368* 279*  BUN 9 20  CREATININE 1.16* 1.94*  CALCIUM 8.5* 8.1*   PT/INR No results for input(s): LABPROT, INR in the last 72 hours. CMP       Component Value Date/Time   NA 135 07/09/2019 0148   NA 140 12/11/2017 1027   K 4.1 07/09/2019 0148   CL 107 07/09/2019 0148   CO2 23 07/09/2019 0148   GLUCOSE 279 (H) 07/09/2019 0148   BUN 20 07/09/2019 0148   BUN 15 12/11/2017 1027   CREATININE 1.94 (H) 07/09/2019 0148   CALCIUM 8.1 (L) 07/09/2019 0148   PROT 5.1 (L) 07/09/2019 0148   PROT 6.1 10/17/2017 0927   ALBUMIN 2.5 (L) 07/09/2019 0148   ALBUMIN 4.0 10/17/2017 0927   AST 27 07/09/2019 0148   ALT 23 07/09/2019 0148   ALKPHOS 59 07/09/2019 0148   BILITOT 0.6 07/09/2019 0148   BILITOT 0.3 10/17/2017 0927   GFRNONAA 25 (L) 07/09/2019 0148   GFRAA 28 (L) 07/09/2019 0148   Lipase     Component Value Date/Time   LIPASE 29 07/06/2019 1906       Studies/Results: No results found.  Anti-infectives: Anti-infectives (From admission, onward)   Start     Dose/Rate Route Frequency Ordered Stop   07/08/19 0500  cefTRIAXone (ROCEPHIN) 2 g in sodium chloride 0.9 % 100 mL IVPB  Status:  Discontinued     2 g 200 mL/hr over 30 Minutes Intravenous Every 24 hours 07/07/19 0839 07/07/19 1802   07/07/19 0245  cefTRIAXone (ROCEPHIN) 2 g in sodium  chloride 0.9 % 100 mL IVPB     2 g 200 mL/hr over 30 Minutes Intravenous  Once 07/07/19 0231 07/07/19 0451       Assessment/Plan  Hypertension Hx CHF CAD with Hx CABG/PTCA stents - on Plavix @ home Hx CVA Type 2 diabetes Neuropathy CKD Hx insomnia Iron/B12 deficiencies AKI - Cr 1.94  Acute cholecystitis - s/p Lap Chole, 4/28, Dr. Ninfa Linden, POD #2 -Patient with increased nausea and distention this morning.  TRH obtaining KUB -Patient reports decreased urine output, difficulty with urination and is now noted to have an AKI.  Check bladder scan and UA - Mobilize, PT  - Pulm toilet   FEN: HH/CM ID: Rocephin periop. None currently. No further abx indicated. WBC 9.6 DVT: SCDs, okay to start chemical prophylaxis. Can restart plavix  Follow-up: DOW   LOS: 2 days     Jillyn Ledger , [facility] 07/09/2019, 8:37 AM Please see Amion for pager number during day hours 7:00am-4:30pm

## 2019-07-10 ENCOUNTER — Inpatient Hospital Stay (HOSPITAL_COMMUNITY): Payer: Medicare HMO

## 2019-07-10 ENCOUNTER — Other Ambulatory Visit: Payer: Self-pay | Admitting: Primary Care

## 2019-07-10 DIAGNOSIS — K810 Acute cholecystitis: Secondary | ICD-10-CM | POA: Diagnosis not present

## 2019-07-10 DIAGNOSIS — M7989 Other specified soft tissue disorders: Secondary | ICD-10-CM | POA: Diagnosis not present

## 2019-07-10 DIAGNOSIS — E785 Hyperlipidemia, unspecified: Secondary | ICD-10-CM

## 2019-07-10 LAB — COMPREHENSIVE METABOLIC PANEL
ALT: 19 U/L (ref 0–44)
AST: 17 U/L (ref 15–41)
Albumin: 2 g/dL — ABNORMAL LOW (ref 3.5–5.0)
Alkaline Phosphatase: 52 U/L (ref 38–126)
Anion gap: 5 (ref 5–15)
BUN: 14 mg/dL (ref 8–23)
CO2: 20 mmol/L — ABNORMAL LOW (ref 22–32)
Calcium: 7.4 mg/dL — ABNORMAL LOW (ref 8.9–10.3)
Chloride: 113 mmol/L — ABNORMAL HIGH (ref 98–111)
Creatinine, Ser: 1.42 mg/dL — ABNORMAL HIGH (ref 0.44–1.00)
GFR calc Af Amer: 41 mL/min — ABNORMAL LOW (ref >60)
GFR calc non Af Amer: 36 mL/min — ABNORMAL LOW (ref >60)
Glucose, Bld: 217 mg/dL — ABNORMAL HIGH (ref 70–99)
Potassium: 3.9 mmol/L (ref 3.5–5.1)
Sodium: 138 mmol/L (ref 135–145)
Total Bilirubin: 0.3 mg/dL (ref 0.3–1.2)
Total Protein: 4.5 g/dL — ABNORMAL LOW (ref 6.5–8.1)

## 2019-07-10 LAB — TROPONIN I (HIGH SENSITIVITY)
Troponin I (High Sensitivity): 32 ng/L — ABNORMAL HIGH (ref <18)
Troponin I (High Sensitivity): 25 ng/L — ABNORMAL HIGH (ref <18)

## 2019-07-10 LAB — GLUCOSE, CAPILLARY
Glucose-Capillary: 211 mg/dL — ABNORMAL HIGH (ref 70–99)
Glucose-Capillary: 271 mg/dL — ABNORMAL HIGH (ref 70–99)
Glucose-Capillary: 309 mg/dL — ABNORMAL HIGH (ref 70–99)

## 2019-07-10 LAB — HEPARIN LEVEL (UNFRACTIONATED): Heparin Unfractionated: 0.35 IU/mL (ref 0.30–0.70)

## 2019-07-10 LAB — CBC
HCT: 32.1 % — ABNORMAL LOW (ref 36.0–46.0)
Hemoglobin: 10.4 g/dL — ABNORMAL LOW (ref 12.0–15.0)
MCH: 29.8 pg (ref 26.0–34.0)
MCHC: 32.4 g/dL (ref 30.0–36.0)
MCV: 92 fL (ref 80.0–100.0)
Platelets: 159 10*3/uL (ref 150–400)
RBC: 3.49 MIL/uL — ABNORMAL LOW (ref 3.87–5.11)
RDW: 13.2 % (ref 11.5–15.5)
WBC: 7.1 10*3/uL (ref 4.0–10.5)
nRBC: 0 % (ref 0.0–0.2)

## 2019-07-10 LAB — TSH: TSH: 1.247 u[IU]/mL (ref 0.350–4.500)

## 2019-07-10 LAB — URINE CULTURE: Culture: <10000 — AB

## 2019-07-10 LAB — D-DIMER, QUANTITATIVE: D-Dimer, Quant: 3.38 ug/mL-FEU — ABNORMAL HIGH (ref 0.00–0.50)

## 2019-07-10 LAB — BRAIN NATRIURETIC PEPTIDE: B Natriuretic Peptide: 1095.8 pg/mL — ABNORMAL HIGH (ref 0.0–100.0)

## 2019-07-10 MED ORDER — METOPROLOL TARTRATE 25 MG PO TABS
12.5000 mg | ORAL_TABLET | Freq: Once | ORAL | Status: AC
  Administered 2019-07-10: 12.5 mg via ORAL
  Filled 2019-07-10: qty 1

## 2019-07-10 MED ORDER — METOPROLOL TARTRATE 5 MG/5ML IV SOLN
2.5000 mg | INTRAVENOUS | Status: DC | PRN
  Administered 2019-07-10: 2.5 mg via INTRAVENOUS
  Filled 2019-07-10: qty 5

## 2019-07-10 MED ORDER — HEPARIN (PORCINE) 25000 UT/250ML-% IV SOLN
1000.0000 [IU]/h | INTRAVENOUS | Status: AC
  Administered 2019-07-10: 1000 [IU]/h via INTRAVENOUS
  Administered 2019-07-11: 1050 [IU]/h via INTRAVENOUS
  Filled 2019-07-10 (×2): qty 250

## 2019-07-10 MED ORDER — AMIODARONE LOAD VIA INFUSION
150.0000 mg | Freq: Once | INTRAVENOUS | Status: AC
  Administered 2019-07-10: 150 mg via INTRAVENOUS
  Filled 2019-07-10: qty 83.34

## 2019-07-10 MED ORDER — ROPINIROLE HCL 0.25 MG PO TABS
0.2500 mg | ORAL_TABLET | Freq: Once | ORAL | Status: AC
  Administered 2019-07-10: 0.25 mg via ORAL
  Filled 2019-07-10: qty 1

## 2019-07-10 MED ORDER — DIPHENHYDRAMINE HCL 50 MG/ML IJ SOLN: 50.0000 mg | Freq: Once | INTRAMUSCULAR | Status: DC

## 2019-07-10 MED ORDER — FUROSEMIDE 10 MG/ML IJ SOLN
20.0000 mg | Freq: Once | INTRAMUSCULAR | Status: AC
  Administered 2019-07-10: 20 mg via INTRAVENOUS
  Filled 2019-07-10: qty 2

## 2019-07-10 MED ORDER — AMIODARONE HCL IN DEXTROSE 360-4.14 MG/200ML-% IV SOLN
60.0000 mg/h | INTRAVENOUS | Status: AC
  Administered 2019-07-10 (×2): 60 mg/h via INTRAVENOUS
  Filled 2019-07-10: qty 200

## 2019-07-10 MED ORDER — AMIODARONE HCL IN DEXTROSE 360-4.14 MG/200ML-% IV SOLN
30.0000 mg/h | INTRAVENOUS | Status: DC
  Administered 2019-07-11 – 2019-07-13 (×6): 30 mg/h via INTRAVENOUS
  Filled 2019-07-10 (×8): qty 200

## 2019-07-10 MED ORDER — DIPHENHYDRAMINE HCL 25 MG PO CAPS: 50.0000 mg | ORAL_CAPSULE | Freq: Once | ORAL | Status: DC

## 2019-07-10 MED ORDER — PREDNISONE 20 MG PO TABS: 50.0000 mg | ORAL_TABLET | Freq: Four times a day (QID) | ORAL | Status: DC

## 2019-07-10 NOTE — Progress Notes (Signed)
3 Days Post-Op   Subjective/Chief Complaint: Had bm, having cp/sob undergoing evaluation now   Objective: Vital signs in last 24 hours: Temp:  [97.8 F (36.6 C)-98.4 F (36.9 C)] 97.9 F (36.6 C) (05/01 0307) Pulse Rate:  [69-79] 69 (05/01 0307) Resp:  [16-18] 18 (05/01 0307) BP: (118-134)/(68-82) 133/77 (05/01 0307) SpO2:  [92 %-96 %] 96 % (05/01 0307) Last BM Date: 07/10/19  Intake/Output from previous day: 04/30 0701 - 05/01 0700 In: 1128.7 [P.O.:477; I.V.:651.7] Out: 150 [Urine:150] Intake/Output this shift: Total I/O In: 240 [P.O.:240] Out: 200 [Urine:200]  GI incisions clean without infection, soft approp tender, bs present   Lab Results:  Recent Labs    07/09/19 0148 07/10/19 0247  WBC 9.6 7.1  HGB 10.8* 10.4*  HCT 33.2* 32.1*  PLT 186 159   BMET Recent Labs    07/09/19 0148 07/10/19 0247  NA 135 138  K 4.1 3.9  CL 107 113*  CO2 23 20*  GLUCOSE 279* 217*  BUN 20 14  CREATININE 1.94* 1.42*  CALCIUM 8.1* 7.4*   PT/INR No results for input(s): LABPROT, INR in the last 72 hours. ABG No results for input(s): PHART, HCO3 in the last 72 hours.  Invalid input(s): PCO2, PO2  Studies/Results: DG Abd 2 Views  Result Date: 07/09/2019 CLINICAL DATA:  Abdominal pain. Ileus. EXAM: ABDOMEN - 2 VIEW COMPARISON:  CT topogram on 04/28 1 FINDINGS: No evidence of dilated bowel loops. Scattered air-fluid levels are noted, which are nonspecific. There is no evidence of free air. Right upper quadrant surgical clips from prior cholecystectomy. No radio-opaque calculi or other significant radiographic abnormality is seen. IMPRESSION: Nonspecific, nonobstructive bowel gas pattern. Electronically Signed   By: Marlaine Hind M.D.   On: 07/09/2019 09:15    Anti-infectives: Anti-infectives (From admission, onward)   Start     Dose/Rate Route Frequency Ordered Stop   07/08/19 0500  cefTRIAXone (ROCEPHIN) 2 g in sodium chloride 0.9 % 100 mL IVPB  Status:  Discontinued     2  g 200 mL/hr over 30 Minutes Intravenous Every 24 hours 07/07/19 0839 07/07/19 1802   07/07/19 0245  cefTRIAXone (ROCEPHIN) 2 g in sodium chloride 0.9 % 100 mL IVPB     2 g 200 mL/hr over 30 Minutes Intravenous  Once 07/07/19 0231 07/07/19 0451      Assessment/Plan: Acute cholecystitis - s/p Lap Chole, 4/28, Dr. Ninfa Linden, POD #3 -labs normal and recovering as expected -undergoing chest pain/sbo eval right now, dont think that needs any further eval for abdomen at this point FEN:HH/CM ID: Rocephinperiop No further abx indicated. DVT: SCDs,okay to start chemical prophylaxis. Can restart plavix  Follow-up:DOW  Rolm Bookbinder 07/10/2019

## 2019-07-10 NOTE — Progress Notes (Signed)
Bilateral lower extremity venous duplex has been completed. Preliminary results can be found in CV Proc through chart review.  Results were given to the patient's nurse, Sharyn Lull.  07/10/19 4:33 PM Carlos Levering RVT

## 2019-07-10 NOTE — Progress Notes (Addendum)
Progress Note  Patient Name: Maria Fox Date of Encounter: 07/10/2019  Primary Cardiologist: Fransico Him, MD   Patient Profile     74 y.o. female w/ PMH of CAD (s/p CABG in 2016 with repeat LHC in 2019 showing occluded LAD and significant disease in the mid LCx with PCI to LCx 11/2017 with LIMA to LAD-patent, SVG to OM 3-occluded, SVG to diagonal could not be found however was assumed to be occluded as well), HLD, HTN, DM 2, chronic diastolic dysfunction (not on diuretic therapy secondary to orthostatic hypotension),CVA in 2016, and autoimmune deficiency syndrome who is currently admitted for acute cholecystitis. Cardiology initially consulted for clearance and underwent lap chole on 4/28. Called back to see the patient on 07/10/2019 for tachycardia.  Venous dopplers >>+ DVT  ECG SVT at 150  Carotid massage >> aflutter w 2:1 AVB  Subjective   Says she felt more short of breath this morning with associated palpitations. Denies any palpitations at this time but still feels short of breath. No recent orthopnea, PND or edema.   Inpatient Medications    Scheduled Meds: . atorvastatin  40 mg Oral Daily  . docusate sodium  100 mg Oral BID  . gabapentin  100 mg Oral TID  . insulin aspart  0-15 Units Subcutaneous TID WC  . insulin aspart  0-5 Units Subcutaneous QHS  . isosorbide mononitrate  15 mg Oral Daily  . latanoprost  1 drop Both Eyes QHS  . metoprolol tartrate  12.5 mg Oral BID   Continuous Infusions: . heparin 1,000 Units/hr (07/10/19 1636)   PRN Meds: acetaminophen **OR** acetaminophen, albuterol, bisacodyl, hydrALAZINE, HYDROcodone-acetaminophen, metoprolol tartrate, morphine injection, ondansetron (ZOFRAN) IV, polyethylene glycol, traZODone   Vital Signs    Vitals:   07/10/19 1342 07/10/19 1520 07/10/19 1600 07/10/19 1603  BP: 121/83 111/79    Pulse: (!) 134 (!) 145  (!) 149  Resp: 16 18    Temp: 97.7 F (36.5 C) 98.1 F (36.7 C)    TempSrc: Oral Oral  Oral  SpO2:  98% 98%  94%  Weight:   71.7 kg   Height:   5\' 2"  (1.575 m)     Intake/Output Summary (Last 24 hours) at 07/10/2019 1729 Last data filed at 07/10/2019 1500 Gross per 24 hour  Intake 604 ml  Output 950 ml  Net -346 ml    Last 3 Weights 07/10/2019 07/06/2019 04/08/2019  Weight (lbs) 158 lb 135 lb 154 lb  Weight (kg) 71.668 kg 61.236 kg 69.854 kg      Telemetry    Narrow complex tachycardia, HR sustaining in the 140's. Most consistent with flutter. - Personally Reviewed  ECG    NSR, HR 71 with RBBB and LAD.  - Personally Reviewed  Physical Exam   General: Well developed, well nourished, female appearing in no acute distress. Head: Normocephalic, atraumatic.  Neck: Supple without bruits, JVD at 8cm. Lungs:  Resp regular and unlabored, rales along bases bilaterally. Heart: rapid and regular tachycardiac.  S1, S2, no S3, S4, or murmur; no rub. Abdomen: Soft, non-tender, non-distended with normoactive bowel sounds. No hepatomegaly. No rebound/guarding. No obvious abdominal masses. Extremities: No clubbing or cyanosis. Trace lower extremity edema. Distal pedal pulses are 2+ bilaterally. Neuro: Alert and oriented X 3. Moves all extremities spontaneously. Psych: Normal affect.  Labs    Chemistry Recent Labs  Lab 07/06/19 1906 07/06/19 1906 07/08/19 0136 07/09/19 0148 07/10/19 0247  NA 134*   < > 134* 135 138  K  4.1   < > 4.3 4.1 3.9  CL 102   < > 104 107 113*  CO2 21*   < > 20* 23 20*  GLUCOSE 301*   < > 368* 279* 217*  BUN 11   < > 9 20 14   CREATININE 1.11*   < > 1.16* 1.94* 1.42*  CALCIUM 9.1   < > 8.5* 8.1* 7.4*  PROT 6.5  --   --  5.1* 4.5*  ALBUMIN 3.6  --   --  2.5* 2.0*  AST 20  --   --  27 17  ALT 23  --   --  23 19  ALKPHOS 107  --   --  59 52  BILITOT 0.6  --   --  0.6 0.3  GFRNONAA 48*   < > 46* 25* 36*  GFRAA 56*   < > 53* 28* 41*  ANIONGAP 11   < > 10 5 5    < > = values in this interval not displayed.     Hematology Recent Labs  Lab 07/08/19 0136  07/09/19 0148 07/10/19 0247  WBC 12.2* 9.6 7.1  RBC 4.38 3.64* 3.49*  HGB 13.0 10.8* 10.4*  HCT 39.0 33.2* 32.1*  MCV 89.0 91.2 92.0  MCH 29.7 29.7 29.8  MCHC 33.3 32.5 32.4  RDW 12.9 13.2 13.2  PLT 204 186 159    Cardiac EnzymesNo results for input(s): TROPONINI in the last 168 hours. No results for input(s): TROPIPOC in the last 168 hours.   BNP Recent Labs  Lab 07/10/19 1027  BNP 1,095.8*     DDimer  Recent Labs  Lab 07/10/19 1027  DDIMER 3.38*     Radiology    DG CHEST PORT 1 VIEW  Result Date: 07/10/2019 CLINICAL DATA:  Shortness of breath. Post cholecystectomy 07/07/2019. EXAM: PORTABLE CHEST 1 VIEW COMPARISON:  11/23/2017 and abdominal CT 07/07/2019 FINDINGS: Lungs are adequately inflated. Loop recorder over the left heart. Lungs are adequately inflated with mild patchy opacification in the left base likely small amount of pleural fluid and atelectasis. Infection in the left base is possible. Borderline stable cardiomegaly. Remainder the exam is unchanged. IMPRESSION: Left base opacification likely small effusion with atelectasis although infection is possible. Electronically Signed   By: Marin Olp M.D.   On: 07/10/2019 13:36   DG Abd 2 Views  Result Date: 07/09/2019 CLINICAL DATA:  Abdominal pain. Ileus. EXAM: ABDOMEN - 2 VIEW COMPARISON:  CT topogram on 04/28 1 FINDINGS: No evidence of dilated bowel loops. Scattered air-fluid levels are noted, which are nonspecific. There is no evidence of free air. Right upper quadrant surgical clips from prior cholecystectomy. No radio-opaque calculi or other significant radiographic abnormality is seen. IMPRESSION: Nonspecific, nonobstructive bowel gas pattern. Electronically Signed   By: Marlaine Hind M.D.   On: 07/09/2019 09:15   VAS [place] LOWER EXTREMITY VENOUS (DVT)  Result Date: 07/10/2019  Lower Venous DVTStudy Indications: Swelling.  Risk Factors: None identified. Limitations: Poor ultrasound/tissue interface. Comparison  Study: No prior studies. Performing Technologist: Oliver Hum RVT  Examination Guidelines: A complete evaluation includes B-mode imaging, spectral Doppler, color Doppler, and power Doppler as needed of all accessible portions of each vessel. Bilateral testing is considered an integral part of a complete examination. Limited examinations for reoccurring indications may be performed as noted. The reflux portion of the exam is performed with the patient in reverse Trendelenburg.  +---------+---------------+---------+-----------+----------+--------------+ RIGHT    CompressibilityPhasicitySpontaneityPropertiesThrombus Aging +---------+---------------+---------+-----------+----------+--------------+ CFV      Full  Yes      Yes                                 +---------+---------------+---------+-----------+----------+--------------+ SFJ      Full                                                        +---------+---------------+---------+-----------+----------+--------------+ FV Prox  Full                                                        +---------+---------------+---------+-----------+----------+--------------+ FV Mid   Full                                                        +---------+---------------+---------+-----------+----------+--------------+ FV DistalFull                                                        +---------+---------------+---------+-----------+----------+--------------+ PFV      Full                                                        +---------+---------------+---------+-----------+----------+--------------+ POP      Full           Yes      Yes                                 +---------+---------------+---------+-----------+----------+--------------+ PTV      Full                                                        +---------+---------------+---------+-----------+----------+--------------+ PERO     Full                                                         +---------+---------------+---------+-----------+----------+--------------+   +---------+---------------+---------+-----------+----------+--------------+ LEFT     CompressibilityPhasicitySpontaneityPropertiesThrombus Aging +---------+---------------+---------+-----------+----------+--------------+ CFV      Full           Yes      Yes                                 +---------+---------------+---------+-----------+----------+--------------+ SFJ  Full                                                        +---------+---------------+---------+-----------+----------+--------------+ FV Prox  Full                                                        +---------+---------------+---------+-----------+----------+--------------+ FV Mid   Full                                                        +---------+---------------+---------+-----------+----------+--------------+ FV DistalFull                                                        +---------+---------------+---------+-----------+----------+--------------+ PFV      Full                                                        +---------+---------------+---------+-----------+----------+--------------+ POP      Full           Yes      Yes                                 +---------+---------------+---------+-----------+----------+--------------+ PTV      Partial                                      Acute          +---------+---------------+---------+-----------+----------+--------------+ PERO     Full                                                        +---------+---------------+---------+-----------+----------+--------------+     Summary: RIGHT: - There is no evidence of deep vein thrombosis in the lower extremity.  - No cystic structure found in the popliteal fossa.  LEFT: - Findings consistent with acute deep vein thrombosis involving the  left posterior tibial veins. - No cystic structure found in the popliteal fossa.  *See table(s) above for measurements and observations.    Preliminary     Cardiac Studies   Echocardiogram: 10/2017 Study Conclusions   - Left ventricle: The cavity size was normal. Wall thickness was  normal. Systolic function was normal. The estimated ejection  fraction was in the range of 55% to 60%. Wall motion was normal;  there were no regional wall motion abnormalities. Doppler  parameters are  consistent with abnormal left ventricular  relaxation (grade 1 diastolic dysfunction). The E/e&' ratio is  between 8-15, suggesting indeterminate LV filling pressure.  - Mitral valve: Mildly thickened leaflets . There was trivial  regurgitation.  - Left atrium: The atrium was normal in size.  - Tricuspid valve: There was trivial regurgitation.  - Pulmonary arteries: PA peak pressure: 33 mm Hg (S) + RAP.   Impressions:   - Compared to a prior study in 2018, the LVEF is unchanged,.   Cardiac Catheterization: 11/2017  The left ventricular systolic function is normal.  LV end diastolic pressure is mildly elevated.  The left ventricular ejection fraction is 55-65% by visual estimate.  Mid Cx lesion is 80% stenosed.  Post intervention, there is a 0% residual stenosis.  A drug-eluting stent was successfully placed using a STENT SIERRA 3.00 X 15 MM.  Prox LAD to Mid LAD lesion is 100% stenosed.  SVG graft was visualized by angiography.  Origin lesion is 100% stenosed.  LIMA graft was visualized by angiography and is normal in caliber.  The graft exhibits no disease.   1.  Significant underlying two-vessel coronary artery disease with occluded mid LAD and significant disease in mid left circumflex.  The RCA is relatively normal.  Patent LIMA to LAD.  Occluded SVG to OM 3.  I could not find any other patent graft even with aortogram.  I suspect that there is likely an SVG to diagonal  which is also occluded. 2.  Normal LV systolic function and mildly elevated left ventricular end-diastolic pressure. 3.  Successful drug-eluting stent placement to the mid left circumflex.  Recommendations:  Recommend uninterrupted dual antiplatelet therapy with Aspirin 81mg  daily and Clopidogrel 75mg  daily for a minimum of 6 months (stable ischemic heart disease - Class I recommendation).     Assessment & Plan    1. Narrow-complex Tachycardia - the patient underwent lap chole on 07/07/2019 and is being followed by General Surgery and Hospitalist team. This morning, she developed acute onset dyspnea and was found to be tachycardiac with HR in the 140's. BNP elevated to 1095 and D-dimer 3.38 (CTA and lower extremity dopplers pending). Initial HS Troponin 32 with repeat 25. Received IV Lasix 20mg  and would give another 40mg  this evening. Repeat BMET in AM.  - she has been continued on Lopressor 25mg  BID and received an additional dose at 1524. HR still in the 140's. Will discuss with Dr. Caryl Comes in regards to starting IV Amiodarone as BP will likely not tolerate IV Cardizem. If she does not convert, may need to consider DCCV next week.  - This patients CHA2DS2-VASc Score and unadjusted Ischemic Stroke Rate (% per year) is equal to 11.2 % stroke rate/year from a score of 7 (HTN, DM, Female, Age (2), CVA(2)). She has been started on Heparin. Anticipate switching to DOAC but will await CTA results to confirm dosing.   2. CAD - s/p CABG in 2016 with repeat LHC in 2019 showing occluded LAD and significant disease in the mid LCx with PCI to LCx 11/2017 with LIMA to LAD-patent, SVG to OM 3-occluded, SVG to diagonal could not be found however was assumed to be occluded as well. - no recent anginal symptoms. HS Troponin values flat this AM.  - continue statin, Imdur and BB therapy.   3. Acute Cholecystitis - s/p lap cholecystectomy on 07/07/2019. Per surgery and admitting team.   For questions or updates,  please contact [facility] Please consult [email or web address] for contact info under  Cardiology/STEMI.   Signed, Erma Heritage , PA-C 5:29 PM 07/10/2019 Pager: [phone]  Atrial flutter 2:1 AVblock  Acute v subacute  CAD with prior CABG  DVT  Acute cholecystitis s/p lap chole  CS massage>> atrial flutter   Pt needs anticoagulation and rate control.  As she is unaware of her aflutter now, prob need to anticipate need for TEEDCCV on Monday as duration not clear,  Dr Thermon Leyland note was early on 4/29--   It may well be less than 48 hrs, and if so moderate likelihood of spontaneous conversion remains   Will use amio for rate control and heparin for now, post op.  The issue of atrial flutter/fib occurring in the acute secondeary setting, ? PE and surgery on long term recurrence is not clear.  There are data that afib has a likelihood of TE similar to primary AFib-- so would be inclined in 6 weeks or so do consider RFCA for atrial flutter substrate and thus be able to limit her anticoagulation to post operative DVT duration

## 2019-07-10 NOTE — Progress Notes (Addendum)
   07/10/19 1122  Assess: MEWS Score  ECG Heart Rate (!) 142  Assess: MEWS Score  MEWS Temp 0  MEWS Systolic 0  MEWS Pulse 3  MEWS RR 0  MEWS LOC 0  MEWS Score 3  MEWS Score Color Yellow  Assess: if the MEWS score is Yellow or Red  Were vital signs taken at a resting state? Yes  Focused Assessment Documented focused assessment  Early Detection of Sepsis Score *See Row Information* Low  MEWS guidelines implemented *See Row Information* Yes  Treat  MEWS Interventions Administered prn meds/treatments  Take Vital Signs  Increase Vital Sign Frequency  Yellow: Q 2hr X 2 then Q 4hr X 2, if remains yellow, continue Q 4hrs  Escalate  MEWS: Escalate Yellow: discuss with charge nurse/RN and consider discussing with provider and RRT  Notify: Charge Nurse/RN  Name of Charge Nurse/RN Notified Joann H  Date Charge Nurse/RN Notified 07/10/19  Time Charge Nurse/RN Notified 1122  Notify: Provider  Provider Name/Title [email or web address] (Dr Cruzita Lederer)  Date Provider Notified 07/10/19  Time Provider Notified 86  Notification Type Face-to-face  Notification Reason Change in status  Response See new orders  Date of Provider Response 07/10/19  Time of Provider Response 1130  plan to transfer to cardiac tele Stat EKG due to complaining of chest pain MD notified and 2 L O2 applied

## 2019-07-10 NOTE — Progress Notes (Signed)
ANTICOAGULATION CONSULT NOTE - Initial Consult  Pharmacy Consult for heparin Indication: pulmonary embolus  Allergies  Allergen Reactions  . Mushroom Extract Complex Anaphylaxis  . Penicillins Anaphylaxis, Hives and Swelling    Has patient had a PCN reaction causing immediate rash, facial/tongue/throat swelling, SOB or lightheadedness with hypotension: Yes Has patient had a PCN reaction causing severe rash involving mucus membranes or skin necrosis: Yes Has patient had a PCN reaction that required hospitalization Yes Has patient had a PCN reaction occurring within the last 10 years: No If all of the above answers are "NO", then may proceed with Cephalosporin use.   . Codeine Nausea And Vomiting  . Sulfa Antibiotics Nausea And Vomiting  . Bee Venom     Throat swelling  . Iohexol   . Erythromycin Base Rash    Patient Measurements: Height: 5\' 2"  (157.5 cm) Weight: 61.2 kg (135 lb) IBW/kg (Calculated) : 50.1 HEPARIN DW (KG): 61.2   Vital Signs: Temp: 97.7 F (36.5 C) (05/01 1342) Temp Source: Oral (05/01 1342) BP: 121/83 (05/01 1342) Pulse Rate: 134 (05/01 1342)  Labs: Recent Labs    07/08/19 0136 07/08/19 0136 07/09/19 0148 07/10/19 0247 07/10/19 1027 07/10/19 1214  HGB 13.0   < > 10.8* 10.4*  --   --   HCT 39.0  --  33.2* 32.1*  --   --   PLT 204  --  186 159  --   --   CREATININE 1.16*  --  1.94* 1.42*  --   --   TROPONINIHS  --   --   --   --  32* 25*   < > = values in this interval not displayed.    Estimated Creatinine Clearance: 29 mL/min (A) (by C-G formula based on SCr of 1.42 mg/dL (H)).   Medical History: Past Medical History:  Diagnosis Date  . Acute left PCA stroke ([place]) 07/13/2015  . Angioedema   . Autoimmune deficiency syndrome ([place])   . CAD (coronary artery disease), native coronary artery 06/2014   s/p NSTEMI with cath showing severe diffuse disease of the mid LAD, occluded large diagonal, 80-90% prox OM and normal dominant RCA. S/P CABG x 3  2016  . Closed fracture of maxilla ([place])   . CVA (cerebral infarction) 07/2014   bilateral corona radiata - periCABG  . Dermatitis    eval [name] 2011: eczema, eval [name] 2011: bx negative for lichen simplex or derm herpetiformis  . Diastolic CHF ([place]) [id number]  . DM (diabetes mellitus), type 2, uncontrolled w/neurologic complication ([place]) [id number]   ?autonomic neuropathy, gastroparesis (06/2014)   . Epidermal cyst of neck 03/25/2017   Excised by derm Domenic Polite)  . HCAP (healthcare-associated pneumonia) 06/2014  . History of chicken pox   . HLD (hyperlipidemia)   . Hx of migraines    remote  . Hypertension   . Maxillary fracture ([place])   . Multiple allergies    mold, wool, dust, feathers  . Orthostatic hypotension 07/2015  . Pleural effusion, left   . S/P lens implant    left side ([name])  . UTI (urinary tract infection) 06/2014  . Vitiligo    Assessment: 74 y.o. female presenting with acute cholecystitis now s/p cholecystectomy on 4/28. Developed new onset dyspnea and chest pain 4/30 and found to have elevated d-dimer. Pharmacy has been consulted for IV heparin dosing for suspicion for pulmonary embolism while venous thromboembolism workup is completed (needs 12 hour notice d/t contrast allergy and need for premedication).  Hgb 10.4  and platelets WNL at 159. No prior to admission anticoagulation and no reports of bleeding noted.  Goal of Therapy:  Heparin level 0.3-0.7 units/ml Monitor platelets by anticoagulation protocol: Yes  Plan:  Start heparin infusion at 1000 units/hr (no bolus per MD) Check 8 hour heparin level Monitor CBC, daily heparin level  Continue to monitor for signs/symptoms of bleeding F/u CT angiogram (will need pre-med for contrast allergy)   Brendolyn Patty, PharmD PGY2 Pharmacy Resident Phone [phone]

## 2019-07-10 NOTE — Progress Notes (Signed)
PROGRESS NOTE  Maria Fox [name] DOB: 1945-08-03 DOA: 07/06/2019 PCP: [name], MD   LOS: 3 days   Brief Narrative / Interim history: 74 year old female with HTN, HLD, DM, history of CVA, CAD status post CABG, came into the hospital with abdominal pain, mainly on the right side which started about 1 to 2 weeks ago.  This is associated with nausea and vomiting.  In the ED she was diagnosed with acute cholecystitis and general surgery took her to the OR shortly after admission on 4/28  Subjective / 24h Interval events: Complains of chest pain this morning, tells me that she is short of breath.  Denies any swelling in her legs.  No cough.  Continues to have abdominal discomfort and nausea  Assessment & Plan: Principal Problem Acute cholecystitis-General surgery consulted, she is status post laparoscopic cholecystectomy on 4/28.  Persistent nausea today, however she is also quite uncomfortable with chest pain or shortness of breath.  Discussed with Dr. Donne Hazel, from surgical standpoint she appears to be recovering well  Active Problems Chest pain/dyspnea-new onset this morning, she has significant coronary history.  Stat troponin fairly low at 35.  D-dimer is elevated, there is theoretical concern about PE in the setting of surgery, will obtain a CT angiogram.  Discussed with patient about contrast allergy, she tells me she is not sure how that appeared there but she is not allergic and was able to tolerate contrast a few days ago on admission.  BNP is elevated chest x-ray stat ordered we will also give low-dose Lasix to stop she has received some fluids in the setting of acute kidney injury and fluids were discontinued this morning.  Cycle the second troponin in 2 hours  Acute kidney injury on chronic kidney disease stage IIIa-creatinine improved with fluids, hold fluids as described above  Essential hypertension-continue home medications, continue metoprolol, she is to be  tachycardic and will add metoprolol as needed  Type 2 diabetes mellitus-cover with sliding scale  Hyperlipidemia-continue atorvastatin  Coronary artery disease, history of chronic diastolic CHF, history of CABG-cardiology consulted for preoperative clearance, overall tolerated surgery.  If troponin will be elevated reconsult cardiology    Scheduled Meds: . atorvastatin  40 mg Oral Daily  . docusate sodium  100 mg Oral BID  . gabapentin  100 mg Oral TID  . insulin aspart  0-15 Units Subcutaneous TID WC  . insulin aspart  0-5 Units Subcutaneous QHS  . isosorbide mononitrate  15 mg Oral Daily  . latanoprost  1 drop Both Eyes QHS  . metoprolol tartrate  12.5 mg Oral BID   Continuous Infusions:  PRN Meds:.acetaminophen **OR** acetaminophen, albuterol, bisacodyl, hydrALAZINE, HYDROcodone-acetaminophen, metoprolol tartrate, morphine injection, ondansetron (ZOFRAN) IV, polyethylene glycol, traZODone  DVT prophylaxis: SCD Code Status: Full code Family Communication: no family at bedside, discussed with the patient  Status is: Inpatient  Remains inpatient appropriate because:Inpatient level of care appropriate due to severity of illness and Has significant postoperative abdominal discomfort, has not passed stool or gas   Dispo: The patient is from: Home              Anticipated d/c is to: Home              Anticipated d/c date is: 1 day              Patient currently is not medically stable to d/c.  Consultants:  General surgery Cardiology  Procedures:  None   Microbiology  None   Antimicrobials:  None     Objective: Vitals:   07/09/19 0419 07/09/19 1411 07/09/19 2033 07/10/19 0307  BP: (!) 112/57 118/68 134/82 133/77  Pulse: 74 70 79 69  Resp: 17 16 18 18   Temp: 98 F (36.7 C) 97.8 F (36.6 C) 98.4 F (36.9 C) 97.9 F (36.6 C)  TempSrc: Oral Oral Oral Oral  SpO2: 92% 93% 92% 96%  Weight:      Height:        Intake/Output Summary (Last 24 hours) at 07/10/2019  1203 Last data filed at 07/10/2019 [id number] Gross per 24 hour  Intake 1128.67 ml  Output 350 ml  Net 778.67 ml   Filed Weights   07/06/19 1842  Weight: 61.2 kg    Examination:  Constitutional: Uncomfortable, in chair Eyes: No icterus ENMT: Moist mucous membranes Respiratory: Faint bibasilar crackles, moves air well Cardiovascular: Regular rate and rhythm, no edema, tachycardic Abdomen: Slightly tender to palpation, bowel sounds positive.  No guarding or rebound Musculoskeletal: no clubbing / cyanosis.  Skin: No rashes Neurologic: Nonfocal  Data Reviewed: I have independently reviewed following labs and imaging studies   CBC: Recent Labs  Lab 07/06/19 1906 07/08/19 0136 07/09/19 0148 07/10/19 0247  WBC 6.3 12.2* 9.6 7.1  HGB 13.8 13.0 10.8* 10.4*  HCT 41.2 39.0 33.2* 32.1*  MCV 91.8 89.0 91.2 92.0  PLT 226 204 186 [id number]   Basic Metabolic Panel: Recent Labs  Lab 07/06/19 1906 07/08/19 0136 07/09/19 0148 07/10/19 0247  NA 134* 134* 135 138  K 4.1 4.3 4.1 3.9  CL 102 104 107 113*  CO2 21* 20* 23 20*  GLUCOSE 301* 368* 279* 217*  BUN 11 9 20 14   CREATININE 1.11* 1.16* 1.94* 1.42*  CALCIUM 9.1 8.5* 8.1* 7.4*   Liver Function Tests: Recent Labs  Lab 07/06/19 1906 07/09/19 0148 07/10/19 0247  AST 20 27 17   ALT 23 23 19   ALKPHOS 107 59 52  BILITOT 0.6 0.6 0.3  PROT 6.5 5.1* 4.5*  ALBUMIN 3.6 2.5* 2.0*   Coagulation Profile: No results for input(s): INR, PROTIME in the last 168 hours. HbA1C: No results for input(s): HGBA1C in the last 72 hours. CBG: Recent Labs  Lab 07/09/19 0741 07/09/19 1146 07/09/19 1641 07/10/19 0805 07/10/19 1156  GLUCAP 229* 197* 241* 211* 271*    Recent Results (from the past 240 hour(s))  Culture, blood (Routine X 2) w Reflex to ID Panel     Status: None (Preliminary result)   Collection Time: 07/07/19  2:50 AM   Specimen: BLOOD  Result Value Ref Range Status   Specimen Description BLOOD LEFT ARM  Final   Special Requests    Final    BOTTLES DRAWN AEROBIC AND ANAEROBIC Blood Culture results may not be optimal due to an excessive volume of blood received in culture bottles   Culture   Final    NO GROWTH 3 DAYS Performed at [facility], [place] [address] [place], [place] [address]    Report Status PENDING  Incomplete  Culture, blood (Routine X 2) w Reflex to ID Panel     Status: None (Preliminary result)   Collection Time: 07/07/19  3:10 AM   Specimen: BLOOD  Result Value Ref Range Status   Specimen Description BLOOD RIGHT FOREARM  Final   Special Requests   Final    BOTTLES DRAWN AEROBIC ONLY Blood Culture results may not be optimal due to an inadequate volume of blood received in culture bottles   Culture  Final    NO GROWTH 3 DAYS Performed at [facility], [place] [address] [place], [place] [address]    Report Status PENDING  Incomplete  Respiratory Panel by RT PCR (Flu A&B, Covid) - Nasopharyngeal Swab     Status: None   Collection Time: 07/07/19  6:35 AM   Specimen: Nasopharyngeal Swab  Result Value Ref Range Status   SARS Coronavirus 2 by RT PCR NEGATIVE NEGATIVE Final    Comment: (NOTE) SARS-CoV-2 target nucleic acids are NOT DETECTED. The SARS-CoV-2 RNA is generally detectable in upper respiratoy specimens during the acute phase of infection. The lowest concentration of SARS-CoV-2 viral copies this assay can detect is 131 copies/mL. A negative result does not preclude SARS-Cov-2 infection and should not be used as the sole basis for treatment or other patient management decisions. A negative result may occur with  improper specimen collection/handling, submission of specimen other than nasopharyngeal swab, presence of viral mutation(s) within the areas targeted by this assay, and inadequate number of viral copies (<131 copies/mL). A negative result must be combined with clinical observations, patient history, and epidemiological information. The expected result is Negative. Fact  Sheet for Patients:  PinkCheek.be Fact Sheet for Healthcare Providers:  GravelBags.it This test is not yet ap proved or cleared by the [place] FDA and  has been authorized for detection and/or diagnosis of SARS-CoV-2 by FDA under an Emergency Use Authorization (EUA). This EUA will remain  in effect (meaning this test can be used) for the duration of the COVID-19 declaration under Section 564(b)(1) of the Act, 21 U.S.C. section 360bbb-3(b)(1), unless the authorization is terminated or revoked sooner.    Influenza A by PCR NEGATIVE NEGATIVE Final   Influenza B by PCR NEGATIVE NEGATIVE Final    Comment: (NOTE) The Xpert Xpress SARS-CoV-2/FLU/RSV assay is intended as an aid in  the diagnosis of influenza from Nasopharyngeal swab specimens and  should not be used as a sole basis for treatment. Nasal washings and  aspirates are unacceptable for Xpert Xpress SARS-CoV-2/FLU/RSV  testing. Fact Sheet for Patients: PinkCheek.be Fact Sheet for Healthcare Providers: GravelBags.it This test is not yet approved or cleared by the [place] FDA and  has been authorized for detection and/or diagnosis of SARS-CoV-2 by  FDA under an Emergency Use Authorization (EUA). This EUA will remain  in effect (meaning this test can be used) for the duration of the  Covid-19 declaration under Section 564(b)(1) of the Act, 21  U.S.C. section 360bbb-3(b)(1), unless the authorization is  terminated or revoked. Performed at [facility], [place] [address], [place], [place] [address]   Culture, Urine     Status: Abnormal   Collection Time: 07/09/19 12:12 PM   Specimen: Urine, Random  Result Value Ref Range Status   Specimen Description URINE, RANDOM  Final   Special Requests NONE  Final   Culture (A)  Final    <10,000 COLONIES/mL INSIGNIFICANT GROWTH Performed at [place] [address], [place], [place] [address]    Report Status 07/10/2019 FINAL  Final     Radiology Studies: No results found.  Marzetta Board, MD, PhD [facility]  Between 7 am - 7 pm I am available, please contact me via Amion or Securechat  Between 7 pm - 7 am I am not available, please contact night coverage MD/APP via Amion

## 2019-07-11 ENCOUNTER — Inpatient Hospital Stay (HOSPITAL_COMMUNITY): Payer: Medicare HMO

## 2019-07-11 DIAGNOSIS — I82442 Acute embolism and thrombosis of left tibial vein: Secondary | ICD-10-CM

## 2019-07-11 DIAGNOSIS — K810 Acute cholecystitis: Secondary | ICD-10-CM | POA: Diagnosis not present

## 2019-07-11 DIAGNOSIS — I361 Nonrheumatic tricuspid (valve) insufficiency: Secondary | ICD-10-CM

## 2019-07-11 HISTORY — DX: Acute embolism and thrombosis of left tibial vein: I82.442

## 2019-07-11 LAB — COMPREHENSIVE METABOLIC PANEL
ALT: 20 U/L (ref 0–44)
AST: 18 U/L (ref 15–41)
Albumin: 2.4 g/dL — ABNORMAL LOW (ref 3.5–5.0)
Alkaline Phosphatase: 66 U/L (ref 38–126)
Anion gap: 7 (ref 5–15)
BUN: 12 mg/dL (ref 8–23)
CO2: 23 mmol/L (ref 22–32)
Calcium: 8.3 mg/dL — ABNORMAL LOW (ref 8.9–10.3)
Chloride: 104 mmol/L (ref 98–111)
Creatinine, Ser: 1.32 mg/dL — ABNORMAL HIGH (ref 0.44–1.00)
GFR calc Af Amer: 45 mL/min — ABNORMAL LOW (ref >60)
GFR calc non Af Amer: 39 mL/min — ABNORMAL LOW (ref >60)
Glucose, Bld: 319 mg/dL — ABNORMAL HIGH (ref 70–99)
Potassium: 4 mmol/L (ref 3.5–5.1)
Sodium: 134 mmol/L — ABNORMAL LOW (ref 135–145)
Total Bilirubin: 0.4 mg/dL (ref 0.3–1.2)
Total Protein: 5.1 g/dL — ABNORMAL LOW (ref 6.5–8.1)

## 2019-07-11 LAB — HEPARIN LEVEL (UNFRACTIONATED)
Heparin Unfractionated: 0.33 IU/mL (ref 0.30–0.70)
Heparin Unfractionated: 0.71 IU/mL — ABNORMAL HIGH (ref 0.30–0.70)

## 2019-07-11 LAB — GLUCOSE, CAPILLARY
Glucose-Capillary: 246 mg/dL — ABNORMAL HIGH (ref 70–99)
Glucose-Capillary: 204 mg/dL — ABNORMAL HIGH (ref 70–99)
Glucose-Capillary: 128 mg/dL — ABNORMAL HIGH (ref 70–99)

## 2019-07-11 LAB — CBC
HCT: 33.1 % — ABNORMAL LOW (ref 36.0–46.0)
Hemoglobin: 10.9 g/dL — ABNORMAL LOW (ref 12.0–15.0)
MCH: 29.8 pg (ref 26.0–34.0)
MCHC: 32.9 g/dL (ref 30.0–36.0)
MCV: 90.4 fL (ref 80.0–100.0)
Platelets: 192 10*3/uL (ref 150–400)
RBC: 3.66 MIL/uL — ABNORMAL LOW (ref 3.87–5.11)
RDW: 13.2 % (ref 11.5–15.5)
WBC: 7.7 10*3/uL (ref 4.0–10.5)
nRBC: 0 % (ref 0.0–0.2)

## 2019-07-11 LAB — ECHOCARDIOGRAM COMPLETE
Height: 62 in
Weight: 2528 oz

## 2019-07-11 MED ORDER — INSULIN GLARGINE 100 UNIT/ML ~~LOC~~ SOLN
5.0000 [IU] | Freq: Every day | SUBCUTANEOUS | Status: DC
  Administered 2019-07-11 – 2019-07-23 (×13): 5 [IU] via SUBCUTANEOUS
  Filled 2019-07-11 (×14): qty 0.05

## 2019-07-11 MED ORDER — PROMETHAZINE HCL 25 MG/ML IJ SOLN
12.5000 mg | Freq: Once | INTRAMUSCULAR | Status: AC
  Administered 2019-07-11: 12.5 mg via INTRAVENOUS
  Filled 2019-07-11: qty 1

## 2019-07-11 NOTE — Progress Notes (Signed)
PROGRESS NOTE  Maria Fox [name] DOB: 11-01-1945 DOA: 07/06/2019 PCP: [name], MD   LOS: 4 days   Brief Narrative / Interim history: 74 year old female with HTN, HLD, DM, history of CVA, CAD status post CABG, came into the hospital with abdominal pain, mainly on the right side which started about 1 to 2 weeks ago.  This is associated with nausea and vomiting.  In the ED she was diagnosed with acute cholecystitis and general surgery took her to the OR shortly after admission on 4/28  Subjective / 24h Interval events: Feeling better this morning, denies any chest pain, denies any shortness of breath.  Significant improvement since yesterday.  Assessment & Plan: Principal Problem Acute cholecystitis-General surgery consulted, she is status post laparoscopic cholecystectomy on 4/28.  Seems to be recovering well  Active Problems Acute DVT, presumed pulmonary embolism-patient with chest pain and new onset a flutter on 5/1.  D-dimer was elevated and she was empirically started on heparin out of concern for PE.  Lower extremity Dopplers did show an acute DVT.  CT angiogram was initially ordered however she does have an allergy to IV contrast and borderline renal function, and given acute DVT I would presume she also has a PE.  She was placed on heparin and seems to be tolerating well.  A flutter/chest pain-cardiology consult appreciated, she was started on amiodarone overnight and converted back to sinus rhythm this morning.  She will be anticoagulated as above  Acute kidney injury on chronic kidney disease stage IIIa-creatinine peaked at 1.9 postop, received fluids yesterday followed by Lasix when she developed chest pain or shortness of breath.  Creatinine improved to 1.3 this morning  Essential hypertension-continue metoprolol  Type 2 diabetes mellitus-cover with sliding scale  CBG (last 3)  Recent Labs    07/10/19 0805 07/10/19 1156 07/10/19 2112  GLUCAP 211* 271*  309*   Hyperlipidemia-continue atorvastatin  Coronary artery disease, history of chronic diastolic CHF, history of CABG-no chest pain this morning.  Troponins flat yesterday  Scheduled Meds: . atorvastatin  40 mg Oral Daily  . docusate sodium  100 mg Oral BID  . gabapentin  100 mg Oral TID  . insulin aspart  0-15 Units Subcutaneous TID WC  . insulin aspart  0-5 Units Subcutaneous QHS  . isosorbide mononitrate  15 mg Oral Daily  . latanoprost  1 drop Both Eyes QHS  . metoprolol tartrate  12.5 mg Oral BID   Continuous Infusions: . amiodarone 30 mg/hr (07/11/19 0512)  . heparin 1,000 Units/hr (07/11/19 0400)   PRN Meds:.acetaminophen **OR** acetaminophen, albuterol, bisacodyl, hydrALAZINE, HYDROcodone-acetaminophen, metoprolol tartrate, morphine injection, ondansetron (ZOFRAN) IV, polyethylene glycol, traZODone  DVT prophylaxis: SCD Code Status: Full code Family Communication: no family at bedside, discussed with the patient  Status is: Inpatient  Remains inpatient appropriate because:New onset DVT, a flutter   Dispo: The patient is from: Home              Anticipated d/c is to: Home              Anticipated d/c date is: 3 days              Patient currently is not medically stable to d/c.  Consultants:  General surgery Cardiology  Procedures:  None   Microbiology  None   Antimicrobials: None     Objective: Vitals:   07/10/19 2012 07/10/19 2356 07/11/19 0431 07/11/19 0753  BP: 110/80 108/76 129/68 127/65  Pulse: 77 72 74 75  Resp: 20 19 17 18   Temp: (!) 97.5 F (36.4 C) (!) 97.3 F (36.3 C) (!) 97.4 F (36.3 C) (!) 97.2 F (36.2 C)  TempSrc: Oral Oral Oral Oral  SpO2: 95% 95% 98% 97%  Weight:      Height:        Intake/Output Summary (Last 24 hours) at 07/11/2019 0906 Last data filed at 07/11/2019 0753 Gross per 24 hour  Intake 1174.68 ml  Output 1360 ml  Net -185.32 ml   Filed Weights   07/06/19 1842 07/10/19 1600  Weight: 61.2 kg 71.7 kg     Examination:  Constitutional: No distress, in bed Eyes: No scleral icterus ENMT: Moist mucous membranes Respiratory: Overall clear, no wheezing, no crackles Cardiovascular: Regular rate and rhythm, no edema, no murmurs appreciated Abdomen: Soft, nondistended, bowel sounds positive Musculoskeletal: no clubbing / cyanosis.  Skin: No rashes seen Neurologic: No focal deficits  Data Reviewed: I have independently reviewed following labs and imaging studies   CBC: Recent Labs  Lab 07/06/19 1906 07/08/19 0136 07/09/19 0148 07/10/19 0247 07/11/19 0029  WBC 6.3 12.2* 9.6 7.1 7.7  HGB 13.8 13.0 10.8* 10.4* 10.9*  HCT 41.2 39.0 33.2* 32.1* 33.1*  MCV 91.8 89.0 91.2 92.0 90.4  PLT 226 204 186 159 [id number]   Basic Metabolic Panel: Recent Labs  Lab 07/06/19 1906 07/08/19 0136 07/09/19 0148 07/10/19 0247 07/11/19 0029  NA 134* 134* 135 138 134*  K 4.1 4.3 4.1 3.9 4.0  CL 102 104 107 113* 104  CO2 21* 20* 23 20* 23  GLUCOSE 301* 368* 279* 217* 319*  BUN 11 9 20 14 12   CREATININE 1.11* 1.16* 1.94* 1.42* 1.32*  CALCIUM 9.1 8.5* 8.1* 7.4* 8.3*   Liver Function Tests: Recent Labs  Lab 07/06/19 1906 07/09/19 0148 07/10/19 0247 07/11/19 0029  AST 20 27 17 18   ALT 23 23 19 20   ALKPHOS 107 59 52 66  BILITOT 0.6 0.6 0.3 0.4  PROT 6.5 5.1* 4.5* 5.1*  ALBUMIN 3.6 2.5* 2.0* 2.4*   Coagulation Profile: No results for input(s): INR, PROTIME in the last 168 hours. HbA1C: No results for input(s): HGBA1C in the last 72 hours. CBG: Recent Labs  Lab 07/09/19 1146 07/09/19 1641 07/10/19 0805 07/10/19 1156 07/10/19 2112  GLUCAP 197* 241* 211* 271* 309*    Recent Results (from the past 240 hour(s))  Culture, blood (Routine X 2) w Reflex to ID Panel     Status: None (Preliminary result)   Collection Time: 07/07/19  2:50 AM   Specimen: BLOOD  Result Value Ref Range Status   Specimen Description BLOOD LEFT ARM  Final   Special Requests   Final    BOTTLES DRAWN AEROBIC AND  ANAEROBIC Blood Culture results may not be optimal due to an excessive volume of blood received in culture bottles   Culture   Final    NO GROWTH 4 DAYS Performed at [facility], [place] [address], [place], [place] [address]    Report Status PENDING  Incomplete  Culture, blood (Routine X 2) w Reflex to ID Panel     Status: None (Preliminary result)   Collection Time: 07/07/19  3:10 AM   Specimen: BLOOD  Result Value Ref Range Status   Specimen Description BLOOD RIGHT FOREARM  Final   Special Requests   Final    BOTTLES DRAWN AEROBIC ONLY Blood Culture results may not be optimal due to an inadequate volume of blood received in culture bottles  Culture   Final    NO GROWTH 4 DAYS Performed at [facility], [place] [address] [place], [place] [address]    Report Status PENDING  Incomplete  Respiratory Panel by RT PCR (Flu A&B, Covid) - Nasopharyngeal Swab     Status: None   Collection Time: 07/07/19  6:35 AM   Specimen: Nasopharyngeal Swab  Result Value Ref Range Status   SARS Coronavirus 2 by RT PCR NEGATIVE NEGATIVE Final    Comment: (NOTE) SARS-CoV-2 target nucleic acids are NOT DETECTED. The SARS-CoV-2 RNA is generally detectable in upper respiratoy specimens during the acute phase of infection. The lowest concentration of SARS-CoV-2 viral copies this assay can detect is 131 copies/mL. A negative result does not preclude SARS-Cov-2 infection and should not be used as the sole basis for treatment or other patient management decisions. A negative result may occur with  improper specimen collection/handling, submission of specimen other than nasopharyngeal swab, presence of viral mutation(s) within the areas targeted by this assay, and inadequate number of viral copies (<131 copies/mL). A negative result must be combined with clinical observations, patient history, and epidemiological information. The expected result is Negative. Fact Sheet for Patients:    PinkCheek.be Fact Sheet for Healthcare Providers:  GravelBags.it This test is not yet ap proved or cleared by the [place] FDA and  has been authorized for detection and/or diagnosis of SARS-CoV-2 by FDA under an Emergency Use Authorization (EUA). This EUA will remain  in effect (meaning this test can be used) for the duration of the COVID-19 declaration under Section 564(b)(1) of the Act, 21 U.S.C. section 360bbb-3(b)(1), unless the authorization is terminated or revoked sooner.    Influenza A by PCR NEGATIVE NEGATIVE Final   Influenza B by PCR NEGATIVE NEGATIVE Final    Comment: (NOTE) The Xpert Xpress SARS-CoV-2/FLU/RSV assay is intended as an aid in  the diagnosis of influenza from Nasopharyngeal swab specimens and  should not be used as a sole basis for treatment. Nasal washings and  aspirates are unacceptable for Xpert Xpress SARS-CoV-2/FLU/RSV  testing. Fact Sheet for Patients: PinkCheek.be Fact Sheet for Healthcare Providers: GravelBags.it This test is not yet approved or cleared by the [place] FDA and  has been authorized for detection and/or diagnosis of SARS-CoV-2 by  FDA under an Emergency Use Authorization (EUA). This EUA will remain  in effect (meaning this test can be used) for the duration of the  Covid-19 declaration under Section 564(b)(1) of the Act, 21  U.S.C. section 360bbb-3(b)(1), unless the authorization is  terminated or revoked. Performed at [facility], [place] [address], [place], [place] [address]   Culture, Urine     Status: Abnormal   Collection Time: 07/09/19 12:12 PM   Specimen: Urine, Random  Result Value Ref Range Status   Specimen Description URINE, RANDOM  Final   Special Requests NONE  Final   Culture (A)  Final    <10,000 COLONIES/mL INSIGNIFICANT GROWTH Performed at [place] [address]  [place], [place] [address]    Report Status 07/10/2019 FINAL  Final     Radiology Studies: DG CHEST PORT 1 VIEW  Result Date: 07/10/2019 CLINICAL DATA:  Shortness of breath. Post cholecystectomy 07/07/2019. EXAM: PORTABLE CHEST 1 VIEW COMPARISON:  11/23/2017 and abdominal CT 07/07/2019 FINDINGS: Lungs are adequately inflated. Loop recorder over the left heart. Lungs are adequately inflated with mild patchy opacification in the left base likely small amount of pleural fluid and atelectasis. Infection  in the left base is possible. Borderline stable cardiomegaly. Remainder the exam is unchanged. IMPRESSION: Left base opacification likely small effusion with atelectasis although infection is possible. Electronically Signed   By: Marin Olp M.D.   On: 07/10/2019 13:36   VAS [place] LOWER EXTREMITY VENOUS (DVT)  Result Date: 07/10/2019  Lower Venous DVTStudy Indications: Swelling.  Risk Factors: None identified. Limitations: Poor ultrasound/tissue interface. Comparison Study: No prior studies. Performing Technologist: Oliver Hum RVT  Examination Guidelines: A complete evaluation includes B-mode imaging, spectral Doppler, color Doppler, and power Doppler as needed of all accessible portions of each vessel. Bilateral testing is considered an integral part of a complete examination. Limited examinations for reoccurring indications may be performed as noted. The reflux portion of the exam is performed with the patient in reverse Trendelenburg.  +---------+---------------+---------+-----------+----------+--------------+ RIGHT    CompressibilityPhasicitySpontaneityPropertiesThrombus Aging +---------+---------------+---------+-----------+----------+--------------+ CFV      Full           Yes      Yes                                 +---------+---------------+---------+-----------+----------+--------------+ SFJ      Full                                                         +---------+---------------+---------+-----------+----------+--------------+ FV Prox  Full                                                        +---------+---------------+---------+-----------+----------+--------------+ FV Mid   Full                                                        +---------+---------------+---------+-----------+----------+--------------+ FV DistalFull                                                        +---------+---------------+---------+-----------+----------+--------------+ PFV      Full                                                        +---------+---------------+---------+-----------+----------+--------------+ POP      Full           Yes      Yes                                 +---------+---------------+---------+-----------+----------+--------------+ PTV      Full                                                        +---------+---------------+---------+-----------+----------+--------------+  PERO     Full                                                        +---------+---------------+---------+-----------+----------+--------------+   +---------+---------------+---------+-----------+----------+--------------+ LEFT     CompressibilityPhasicitySpontaneityPropertiesThrombus Aging +---------+---------------+---------+-----------+----------+--------------+ CFV      Full           Yes      Yes                                 +---------+---------------+---------+-----------+----------+--------------+ SFJ      Full                                                        +---------+---------------+---------+-----------+----------+--------------+ FV Prox  Full                                                        +---------+---------------+---------+-----------+----------+--------------+ FV Mid   Full                                                         +---------+---------------+---------+-----------+----------+--------------+ FV DistalFull                                                        +---------+---------------+---------+-----------+----------+--------------+ PFV      Full                                                        +---------+---------------+---------+-----------+----------+--------------+ POP      Full           Yes      Yes                                 +---------+---------------+---------+-----------+----------+--------------+ PTV      Partial                                      Acute          +---------+---------------+---------+-----------+----------+--------------+ PERO     Full                                                        +---------+---------------+---------+-----------+----------+--------------+  Summary: RIGHT: - There is no evidence of deep vein thrombosis in the lower extremity.  - No cystic structure found in the popliteal fossa.  LEFT: - Findings consistent with acute deep vein thrombosis involving the left posterior tibial veins. - No cystic structure found in the popliteal fossa.  *See table(s) above for measurements and observations.    Preliminary     Marzetta Board, MD, PhD [facility]  Between 7 am - 7 pm I am available, please contact me via Amion or Securechat  Between 7 pm - 7 am I am not available, please contact night coverage MD/APP via Amion

## 2019-07-11 NOTE — Progress Notes (Signed)
  Echocardiogram 2D Echocardiogram has been performed.  Maria Fox 07/11/2019, 4:36 PM

## 2019-07-11 NOTE — Progress Notes (Signed)
ANTICOAGULATION CONSULT NOTE - Follow Up Consult  Pharmacy Consult for heparin Indication: pulmonary embolus  Allergies  Allergen Reactions  . Mushroom Extract Complex Anaphylaxis  . Penicillins Anaphylaxis, Hives and Swelling    Has patient had a PCN reaction causing immediate rash, facial/tongue/throat swelling, SOB or lightheadedness with hypotension: Yes Has patient had a PCN reaction causing severe rash involving mucus membranes or skin necrosis: Yes Has patient had a PCN reaction that required hospitalization Yes Has patient had a PCN reaction occurring within the last 10 years: No If all of the above answers are "NO", then may proceed with Cephalosporin use.   . Codeine Nausea And Vomiting  . Sulfa Antibiotics Nausea And Vomiting  . Bee Venom     Throat swelling  . Iohexol   . Erythromycin Base Rash    Patient Measurements: Height: 5\' 2"  (157.5 cm) Weight: 71.7 kg (158 lb) IBW/kg (Calculated) : 50.1 HEPARIN DW (KG): 61.2   Vital Signs: Temp: 97.5 F (36.4 C) (05/02 1635) Temp Source: Oral (05/02 1635) BP: 131/71 (05/02 1635) Pulse Rate: 64 (05/02 1635)  Labs: Recent Labs    07/09/19 0148 07/09/19 0148 07/10/19 0247 07/10/19 1027 07/10/19 1214 07/10/19 2327 07/11/19 0029 07/11/19 1608  HGB 10.8*   < > 10.4*  --   --   --  10.9*  --   HCT 33.2*  --  32.1*  --   --   --  33.1*  --   PLT 186  --  159  --   --   --  192  --   HEPARINUNFRC  --   --   --   --   --  0.35 0.33 0.71*  CREATININE 1.94*  --  1.42*  --   --   --  1.32*  --   TROPONINIHS  --   --   --  32* 25*  --   --   --    < > = values in this interval not displayed.    Estimated Creatinine Clearance: 33.6 mL/min (A) (by C-G formula based on SCr of 1.32 mg/dL (H)).   Medical History: Past Medical History:  Diagnosis Date  . Acute left PCA stroke ([place]) 07/13/2015  . Angioedema   . Autoimmune deficiency syndrome ([place])   . CAD (coronary artery disease), native coronary artery 06/2014   s/p  NSTEMI with cath showing severe diffuse disease of the mid LAD, occluded large diagonal, 80-90% prox OM and normal dominant RCA. S/P CABG x 3 2016  . Closed fracture of maxilla ([place])   . CVA (cerebral infarction) 07/2014   bilateral corona radiata - periCABG  . Dermatitis    eval [name] 2011: eczema, eval [name] 2011: bx negative for lichen simplex or derm herpetiformis  . Diastolic CHF ([place]) 99991111  . DM (diabetes mellitus), type 2, uncontrolled w/neurologic complication ([place]) [id number]   ?autonomic neuropathy, gastroparesis (06/2014)   . Epidermal cyst of neck 03/25/2017   Excised by derm Domenic Polite)  . HCAP (healthcare-associated pneumonia) 06/2014  . History of chicken pox   . HLD (hyperlipidemia)   . Hx of migraines    remote  . Hypertension   . Maxillary fracture ([place])   . Multiple allergies    mold, wool, dust, feathers  . Orthostatic hypotension 07/2015  . Pleural effusion, left   . S/P lens implant    left side ([name])  . UTI (urinary tract infection) 06/2014  . Vitiligo    Assessment: 74 y.o. female presenting  with acute cholecystitis now s/p cholecystectomy on 4/28. Developed new onset dyspnea and chest pain 4/30 and found to have elevated d-dimer. Pharmacy has been consulted for IV heparin dosing for suspicion for pulmonary embolism. LE dopplers noted acute left DVT  HL this evening is slightly supratherapeutic at 0.71 on 1050 units/hr. RN reports no more s/s of bleeding around IV site   Goal of Therapy:  Heparin level 0.3-0.7 units/ml Monitor platelets by anticoagulation protocol: Yes  Plan:  Will decrease heparin infusion back to 1000 units/hr Check 8 hour heparin level Monitor CBC, daily heparin level  Continue to monitor for signs/symptoms of bleeding F/u CT angiogram (will need pre-med for contrast allergy)   Albertina Parr, PharmD., BCPS, BCCCP Clinical Pharmacist Please refer to [facility] for unit-specific pharmacist

## 2019-07-11 NOTE — Progress Notes (Signed)
ANTICOAGULATION CONSULT NOTE - Follow Up Consult  Pharmacy Consult for Heparin Indication: DVT +/- PE and Aflutter  Labs: Recent Labs    07/08/19 0136 07/08/19 0136 07/09/19 0148 07/10/19 0247 07/10/19 1027 07/10/19 1214 07/10/19 2327  HGB 13.0   < > 10.8* 10.4*  --   --   --   HCT 39.0  --  33.2* 32.1*  --   --   --   PLT 204  --  186 159  --   --   --   HEPARINUNFRC  --   --   --   --   --   --  0.35  CREATININE 1.16*  --  1.94* 1.42*  --   --   --   TROPONINIHS  --   --   --   --  32* 25*  --    < > = values in this interval not displayed.    Assessment/Plan:  74yo female therapeutic on heparin with initial dosing for possible PE, now w/ confirmed DVT and new Aflutter. Will continue gtt at current rate and confirm stable with additional level.     Wynona Neat, PharmD, BCPS  07/11/2019,12:10 AM

## 2019-07-11 NOTE — Progress Notes (Addendum)
ANTICOAGULATION CONSULT NOTE - Initial Consult  Pharmacy Consult for heparin Indication: pulmonary embolus  Allergies  Allergen Reactions  . Mushroom Extract Complex Anaphylaxis  . Penicillins Anaphylaxis, Hives and Swelling    Has patient had a PCN reaction causing immediate rash, facial/tongue/throat swelling, SOB or lightheadedness with hypotension: Yes Has patient had a PCN reaction causing severe rash involving mucus membranes or skin necrosis: Yes Has patient had a PCN reaction that required hospitalization Yes Has patient had a PCN reaction occurring within the last 10 years: No If all of the above answers are "NO", then may proceed with Cephalosporin use.   . Codeine Nausea And Vomiting  . Sulfa Antibiotics Nausea And Vomiting  . Bee Venom     Throat swelling  . Iohexol   . Erythromycin Base Rash    Patient Measurements: Height: 5\' 2"  (157.5 cm) Weight: 71.7 kg (158 lb) IBW/kg (Calculated) : 50.1 HEPARIN DW (KG): 61.2   Vital Signs: Temp: 97.2 F (36.2 C) (05/02 0753) Temp Source: Oral (05/02 0753) BP: 127/65 (05/02 0753) Pulse Rate: 75 (05/02 0753)  Labs: Recent Labs    07/09/19 0148 07/09/19 0148 07/10/19 0247 07/10/19 1027 07/10/19 1214 07/10/19 2327 07/11/19 0029  HGB 10.8*   < > 10.4*  --   --   --  10.9*  HCT 33.2*  --  32.1*  --   --   --  33.1*  PLT 186  --  159  --   --   --  192  HEPARINUNFRC  --   --   --   --   --  0.35 0.33  CREATININE 1.94*  --  1.42*  --   --   --  1.32*  TROPONINIHS  --   --   --  32* 25*  --   --    < > = values in this interval not displayed.    Estimated Creatinine Clearance: 33.6 mL/min (A) (by C-G formula based on SCr of 1.32 mg/dL (H)).   Medical History: Past Medical History:  Diagnosis Date  . Acute left PCA stroke ([place]) 07/13/2015  . Angioedema   . Autoimmune deficiency syndrome ([place])   . CAD (coronary artery disease), native coronary artery 06/2014   s/p NSTEMI with cath showing severe diffuse disease  of the mid LAD, occluded large diagonal, 80-90% prox OM and normal dominant RCA. S/P CABG x 3 2016  . Closed fracture of maxilla ([place])   . CVA (cerebral infarction) 07/2014   bilateral corona radiata - periCABG  . Dermatitis    eval [name] 2011: eczema, eval [name] 2011: bx negative for lichen simplex or derm herpetiformis  . Diastolic CHF ([place]) [id number]  . DM (diabetes mellitus), type 2, uncontrolled w/neurologic complication ([place]) [id number]   ?autonomic neuropathy, gastroparesis (06/2014)   . Epidermal cyst of neck 03/25/2017   Excised by derm Domenic Polite)  . HCAP (healthcare-associated pneumonia) 06/2014  . History of chicken pox   . HLD (hyperlipidemia)   . Hx of migraines    remote  . Hypertension   . Maxillary fracture ([place])   . Multiple allergies    mold, wool, dust, feathers  . Orthostatic hypotension 07/2015  . Pleural effusion, left   . S/P lens implant    left side ([name])  . UTI (urinary tract infection) 06/2014  . Vitiligo    Assessment: 74 y.o. female presenting with acute cholecystitis now s/p cholecystectomy on 4/28. Developed new onset dyspnea and chest pain 4/30 and found to  have elevated d-dimer. Pharmacy has been consulted for IV heparin dosing for suspicion for pulmonary embolism while venous thromboembolism workup is completed (needs 12 hour notice d/t contrast allergy and need for premedication).  Hgb 10.9 stable and platelets WNL at 192. No prior to admission anticoagulation and no reports of bleeding noted or issues with infusion. RN did note that there was some blood around the IV site but she will adjust the site.  Goal of Therapy:  Heparin level 0.3-0.7 units/ml Monitor platelets by anticoagulation protocol: Yes  Plan:  Increase heparin infusion to 1050 units/hr Check 8 hour heparin level Monitor CBC, daily heparin level  Continue to monitor for signs/symptoms of bleeding F/u CT angiogram (will need pre-med for contrast allergy)   Sherren Kerns,  PharmD PGY1 Acute Care Pharmacy Resident

## 2019-07-11 NOTE — Progress Notes (Signed)
[facility] Progress Note  4 Days Post-Op  Subjective: CC-  Feeling better than yesterday. CP/SOB improving. Denies abdominal pain, n/v. Tolerating diet. Ate most of her breakfast. BM yesterday. Found to have LLE DVT and Atrial flutter, started on amiodarone and IV heparin. Cardiology following. Considering TEEDCCV on Monday.  Objective: Vital signs in last 24 hours: Temp:  [97.2 F (36.2 C)-98.1 F (36.7 C)] 97.2 F (36.2 C) (05/02 0753) Pulse Rate:  [72-149] 75 (05/02 0753) Resp:  [16-20] 18 (05/02 0753) BP: (107-129)/(65-83) 127/65 (05/02 0753) SpO2:  [94 %-98 %] 97 % (05/02 0753) Weight:  [71.7 kg] 71.7 kg (05/01 1600) Last BM Date: 07/10/19  Intake/Output from previous day: 05/01 0701 - 05/02 0700 In: 1174.7 [P.O.:840; I.V.:334.7] Out: 800 [Urine:800] Intake/Output this shift: Total I/O In: -  Out: 560 [Urine:560]  PE: Gen:  Alert, NAD, pleasant HEENT: EOM's intact, pupils equal and round Card:  RRR Pulm:  rate and effort normal on [place] Abd: Soft, NT/ND, +BS, lap incisions cdi with some ecchymosis but no erythema or drainage  Lab Results:  Recent Labs    07/10/19 0247 07/11/19 0029  WBC 7.1 7.7  HGB 10.4* 10.9*  HCT 32.1* 33.1*  PLT 159 192   BMET Recent Labs    07/10/19 0247 07/11/19 0029  NA 138 134*  K 3.9 4.0  CL 113* 104  CO2 20* 23  GLUCOSE 217* 319*  BUN 14 12  CREATININE 1.42* 1.32*  CALCIUM 7.4* 8.3*   PT/INR No results for input(s): LABPROT, INR in the last 72 hours. CMP     Component Value Date/Time   NA 134 (L) 07/11/2019 0029   NA 140 12/11/2017 1027   K 4.0 07/11/2019 0029   CL 104 07/11/2019 0029   CO2 23 07/11/2019 0029   GLUCOSE 319 (H) 07/11/2019 0029   BUN 12 07/11/2019 0029   BUN 15 12/11/2017 1027   CREATININE 1.32 (H) 07/11/2019 0029   CALCIUM 8.3 (L) 07/11/2019 0029   PROT 5.1 (L) 07/11/2019 0029   PROT 6.1 10/17/2017 0927   ALBUMIN 2.4 (L) 07/11/2019 0029   ALBUMIN 4.0 10/17/2017 0927   AST 18  07/11/2019 0029   ALT 20 07/11/2019 0029   ALKPHOS 66 07/11/2019 0029   BILITOT 0.4 07/11/2019 0029   BILITOT 0.3 10/17/2017 0927   GFRNONAA 39 (L) 07/11/2019 0029   GFRAA 45 (L) 07/11/2019 0029   Lipase     Component Value Date/Time   LIPASE 29 07/06/2019 1906       Studies/Results: DG CHEST PORT 1 VIEW  Result Date: 07/10/2019 CLINICAL DATA:  Shortness of breath. Post cholecystectomy 07/07/2019. EXAM: PORTABLE CHEST 1 VIEW COMPARISON:  11/23/2017 and abdominal CT 07/07/2019 FINDINGS: Lungs are adequately inflated. Loop recorder over the left heart. Lungs are adequately inflated with mild patchy opacification in the left base likely small amount of pleural fluid and atelectasis. Infection in the left base is possible. Borderline stable cardiomegaly. Remainder the exam is unchanged. IMPRESSION: Left base opacification likely small effusion with atelectasis although infection is possible. Electronically Signed   By: Marin Olp M.D.   On: 07/10/2019 13:36   VAS [place] LOWER EXTREMITY VENOUS (DVT)  Result Date: 07/10/2019  Lower Venous DVTStudy Indications: Swelling.  Risk Factors: None identified. Limitations: Poor ultrasound/tissue interface. Comparison Study: No prior studies. Performing Technologist: Oliver Hum RVT  Examination Guidelines: A complete evaluation includes B-mode imaging, spectral Doppler, color Doppler, and power Doppler as needed of all accessible portions of each vessel.  Bilateral testing is considered an integral part of a complete examination. Limited examinations for reoccurring indications may be performed as noted. The reflux portion of the exam is performed with the patient in reverse Trendelenburg.  +---------+---------------+---------+-----------+----------+--------------+ RIGHT    CompressibilityPhasicitySpontaneityPropertiesThrombus Aging +---------+---------------+---------+-----------+----------+--------------+ CFV      Full           Yes      Yes                                  +---------+---------------+---------+-----------+----------+--------------+ SFJ      Full                                                        +---------+---------------+---------+-----------+----------+--------------+ FV Prox  Full                                                        +---------+---------------+---------+-----------+----------+--------------+ FV Mid   Full                                                        +---------+---------------+---------+-----------+----------+--------------+ FV DistalFull                                                        +---------+---------------+---------+-----------+----------+--------------+ PFV      Full                                                        +---------+---------------+---------+-----------+----------+--------------+ POP      Full           Yes      Yes                                 +---------+---------------+---------+-----------+----------+--------------+ PTV      Full                                                        +---------+---------------+---------+-----------+----------+--------------+ PERO     Full                                                        +---------+---------------+---------+-----------+----------+--------------+   +---------+---------------+---------+-----------+----------+--------------+ LEFT     CompressibilityPhasicitySpontaneityPropertiesThrombus Aging +---------+---------------+---------+-----------+----------+--------------+  CFV      Full           Yes      Yes                                 +---------+---------------+---------+-----------+----------+--------------+ SFJ      Full                                                        +---------+---------------+---------+-----------+----------+--------------+ FV Prox  Full                                                         +---------+---------------+---------+-----------+----------+--------------+ FV Mid   Full                                                        +---------+---------------+---------+-----------+----------+--------------+ FV DistalFull                                                        +---------+---------------+---------+-----------+----------+--------------+ PFV      Full                                                        +---------+---------------+---------+-----------+----------+--------------+ POP      Full           Yes      Yes                                 +---------+---------------+---------+-----------+----------+--------------+ PTV      Partial                                      Acute          +---------+---------------+---------+-----------+----------+--------------+ PERO     Full                                                        +---------+---------------+---------+-----------+----------+--------------+     Summary: RIGHT: - There is no evidence of deep vein thrombosis in the lower extremity.  - No cystic structure found in the popliteal fossa.  LEFT: - Findings consistent with acute deep vein thrombosis involving the left posterior tibial veins. - No cystic structure found in the popliteal fossa.  *See table(s) above for measurements and observations.  Preliminary     Anti-infectives: Anti-infectives (From admission, onward)   Start     Dose/Rate Route Frequency Ordered Stop   07/08/19 0500  cefTRIAXone (ROCEPHIN) 2 g in sodium chloride 0.9 % 100 mL IVPB  Status:  Discontinued     2 g 200 mL/hr over 30 Minutes Intravenous Every 24 hours 07/07/19 0839 07/07/19 1802   07/07/19 0245  cefTRIAXone (ROCEPHIN) 2 g in sodium chloride 0.9 % 100 mL IVPB     2 g 200 mL/hr over 30 Minutes Intravenous  Once 07/07/19 0231 07/07/19 0451       Assessment/Plan Hypertension Hx CHF CAD with Hx CABG/PTCA stents- on Plavix@ home Hx CVA Type  2 diabetes Neuropathy CKD Hx insomnia Iron/B12 deficiencies AKI Atrial flutter  Acute DVT --per [facility]--  Acute cholecystitis - s/p Lap Chole, 4/28, Dr. Ninfa Linden, POD #4 -labs normal and recovering as expected -undergoing chest pain/sbo eval right now, dont think that needs any further eval for abdomen at this point  FEN:HH/CM ID: Rocephinperiop No further abx indicated. DVT: SCDs,IV heparin, ok to transition to oral anticoagulation when indicated Follow-up:DOW   LOS: 4 days    Wellington Hampshire, [facility] 07/11/2019, 9:03 AM Please see Amion for pager number during day hours 7:00am-4:30pm

## 2019-07-11 NOTE — Progress Notes (Signed)
Progress Note  Patient Name: Maria Fox Date of Encounter: 07/11/2019  Primary Cardiologist: Fransico Him, MD   Patient Profile     74 y.o. female w/ PMH of CAD (s/p CABG in 2016 with repeat LHC in 2019 showing occluded LAD and significant disease in the mid LCx with PCI to LCx 11/2017 with LIMA to LAD-patent, SVG to OM 3-occluded, SVG to diagonal could not be found however was assumed to be occluded as well), HLD, HTN, DM 2, chronic diastolic dysfunction (not on diuretic therapy secondary to orthostatic hypotension),CVA in 2016, and autoimmune deficiency syndrome who is currently admitted for acute cholecystitis. Cardiology initially consulted for clearance and underwent lap chole on 4/28. Called back to see the patient on 07/10/2019 for tachycardia.  Venous dopplers >>+ DVT  ECG SVT at 150  Carotid massage >> aflutter w 2:1 AVB  Subjective   Feels better with less shortness of breath.  No palpitations.  Just tired.  Inpatient Medications    Scheduled Meds: . atorvastatin  40 mg Oral Daily  . docusate sodium  100 mg Oral BID  . gabapentin  100 mg Oral TID  . insulin aspart  0-15 Units Subcutaneous TID WC  . insulin aspart  0-5 Units Subcutaneous QHS  . insulin glargine  5 Units Subcutaneous Daily  . isosorbide mononitrate  15 mg Oral Daily  . latanoprost  1 drop Both Eyes QHS  . metoprolol tartrate  12.5 mg Oral BID   Continuous Infusions: . amiodarone 30 mg/hr (07/11/19 0512)  . heparin 1,050 Units/hr (07/11/19 0908)   PRN Meds: acetaminophen **OR** acetaminophen, albuterol, bisacodyl, hydrALAZINE, HYDROcodone-acetaminophen, metoprolol tartrate, morphine injection, ondansetron (ZOFRAN) IV, polyethylene glycol, traZODone   Vital Signs    Vitals:   07/10/19 2012 07/10/19 2356 07/11/19 0431 07/11/19 0753  BP: 110/80 108/76 129/68 127/65  Pulse: 77 72 74 75  Resp: 20 19 17 18   Temp: (!) 97.5 F (36.4 C) (!) 97.3 F (36.3 C) (!) 97.4 F (36.3 C) (!) 97.2 F (36.2 C)    TempSrc: Oral Oral Oral Oral  SpO2: 95% 95% 98% 97%  Weight:      Height:        Intake/Output Summary (Last 24 hours) at 07/11/2019 1140 Last data filed at 07/11/2019 0753 Gross per 24 hour  Intake 934.68 ml  Output 1160 ml  Net -225.32 ml    Last 3 Weights 07/10/2019 07/06/2019 04/08/2019  Weight (lbs) 158 lb 135 lb 154 lb  Weight (kg) 71.668 kg 61.236 kg 69.854 kg      Telemetry    Atrial flutter converted to sinus rhythm with the help of amiodarone. ECG      Physical Exam   Well developed and nourished in no acute distress HENT normal Neck supple  Clear Regular rate and rhythm, no murmurs or gallops Abd-soft with active BS No Clubbing cyanosis edema Skin-warm and dry A & Oriented  Grossly normal sensory and motor function     Labs    Chemistry Recent Labs  Lab 07/09/19 0148 07/10/19 0247 07/11/19 0029  NA 135 138 134*  K 4.1 3.9 4.0  CL 107 113* 104  CO2 23 20* 23  GLUCOSE 279* 217* 319*  BUN 20 14 12   CREATININE 1.94* 1.42* 1.32*  CALCIUM 8.1* 7.4* 8.3*  PROT 5.1* 4.5* 5.1*  ALBUMIN 2.5* 2.0* 2.4*  AST 27 17 18   ALT 23 19 20   ALKPHOS 59 52 66  BILITOT 0.6 0.3 0.4  GFRNONAA 25* 36*  39*  GFRAA 28* 41* 45*  ANIONGAP 5 5 7      Hematology Recent Labs  Lab 07/09/19 0148 07/10/19 0247 07/11/19 0029  WBC 9.6 7.1 7.7  RBC 3.64* 3.49* 3.66*  HGB 10.8* 10.4* 10.9*  HCT 33.2* 32.1* 33.1*  MCV 91.2 92.0 90.4  MCH 29.7 29.8 29.8  MCHC 32.5 32.4 32.9  RDW 13.2 13.2 13.2  PLT 186 159 192    Cardiac EnzymesNo results for input(s): TROPONINI in the last 168 hours. No results for input(s): TROPIPOC in the last 168 hours.   BNP Recent Labs  Lab 07/10/19 1027  BNP 1,095.8*     DDimer  Recent Labs  Lab 07/10/19 1027  DDIMER 3.38*     Radiology    DG CHEST PORT 1 VIEW  Result Date: 07/10/2019 CLINICAL DATA:  Shortness of breath. Post cholecystectomy 07/07/2019. EXAM: PORTABLE CHEST 1 VIEW COMPARISON:  11/23/2017 and abdominal CT 07/07/2019  FINDINGS: Lungs are adequately inflated. Loop recorder over the left heart. Lungs are adequately inflated with mild patchy opacification in the left base likely small amount of pleural fluid and atelectasis. Infection in the left base is possible. Borderline stable cardiomegaly. Remainder the exam is unchanged. IMPRESSION: Left base opacification likely small effusion with atelectasis although infection is possible. Electronically Signed   By: Marin Olp M.D.   On: 07/10/2019 13:36   VAS [place] LOWER EXTREMITY VENOUS (DVT)  Result Date: 07/11/2019  Lower Venous DVTStudy Indications: Swelling.  Risk Factors: None identified. Limitations: Poor ultrasound/tissue interface. Comparison Study: No prior studies. Performing Technologist: Oliver Hum RVT  Examination Guidelines: A complete evaluation includes B-mode imaging, spectral Doppler, color Doppler, and power Doppler as needed of all accessible portions of each vessel. Bilateral testing is considered an integral part of a complete examination. Limited examinations for reoccurring indications may be performed as noted. The reflux portion of the exam is performed with the patient in reverse Trendelenburg.  +---------+---------------+---------+-----------+----------+--------------+ RIGHT    CompressibilityPhasicitySpontaneityPropertiesThrombus Aging +---------+---------------+---------+-----------+----------+--------------+ CFV      Full           Yes      Yes                                 +---------+---------------+---------+-----------+----------+--------------+ SFJ      Full                                                        +---------+---------------+---------+-----------+----------+--------------+ FV Prox  Full                                                        +---------+---------------+---------+-----------+----------+--------------+ FV Mid   Full                                                         +---------+---------------+---------+-----------+----------+--------------+ FV DistalFull                                                        +---------+---------------+---------+-----------+----------+--------------+  PFV      Full                                                        +---------+---------------+---------+-----------+----------+--------------+ POP      Full           Yes      Yes                                 +---------+---------------+---------+-----------+----------+--------------+ PTV      Full                                                        +---------+---------------+---------+-----------+----------+--------------+ PERO     Full                                                        +---------+---------------+---------+-----------+----------+--------------+   +---------+---------------+---------+-----------+----------+--------------+ LEFT     CompressibilityPhasicitySpontaneityPropertiesThrombus Aging +---------+---------------+---------+-----------+----------+--------------+ CFV      Full           Yes      Yes                                 +---------+---------------+---------+-----------+----------+--------------+ SFJ      Full                                                        +---------+---------------+---------+-----------+----------+--------------+ FV Prox  Full                                                        +---------+---------------+---------+-----------+----------+--------------+ FV Mid   Full                                                        +---------+---------------+---------+-----------+----------+--------------+ FV DistalFull                                                        +---------+---------------+---------+-----------+----------+--------------+ PFV      Full                                                         +---------+---------------+---------+-----------+----------+--------------+  POP      Full           Yes      Yes                                 +---------+---------------+---------+-----------+----------+--------------+ PTV      Partial                                      Acute          +---------+---------------+---------+-----------+----------+--------------+ PERO     Full                                                        +---------+---------------+---------+-----------+----------+--------------+     Summary: RIGHT: - There is no evidence of deep vein thrombosis in the lower extremity.  - No cystic structure found in the popliteal fossa.  LEFT: - Findings consistent with acute deep vein thrombosis involving the left posterior tibial veins. - No cystic structure found in the popliteal fossa.  *See table(s) above for measurements and observations. Electronically signed by Monica Martinez MD on 07/11/2019 at 11:02:35 AM.    Final     Cardiac Studies   Echocardiogram: 10/2017 Study Conclusions   - Left ventricle: The cavity size was normal. Wall thickness was  normal. Systolic function was normal. The estimated ejection  fraction was in the range of 55% to 60%. Wall motion was normal;  there were no regional wall motion abnormalities. Doppler  parameters are consistent with abnormal left ventricular  relaxation (grade 1 diastolic dysfunction). The E/e&' ratio is  between 8-15, suggesting indeterminate LV filling pressure.  - Mitral valve: Mildly thickened leaflets . There was trivial  regurgitation.  - Left atrium: The atrium was normal in size.  - Tricuspid valve: There was trivial regurgitation.  - Pulmonary arteries: PA peak pressure: 33 mm Hg (S) + RAP.   Impressions:   - Compared to a prior study in 2018, the LVEF is unchanged,.   Cardiac Catheterization: 11/2017  The left ventricular systolic function is normal.  LV end diastolic pressure is  mildly elevated.  The left ventricular ejection fraction is 55-65% by visual estimate.  Mid Cx lesion is 80% stenosed.  Post intervention, there is a 0% residual stenosis.  A drug-eluting stent was successfully placed using a STENT SIERRA 3.00 X 15 MM.  Prox LAD to Mid LAD lesion is 100% stenosed.  SVG graft was visualized by angiography.  Origin lesion is 100% stenosed.  LIMA graft was visualized by angiography and is normal in caliber.  The graft exhibits no disease.   1.  Significant underlying two-vessel coronary artery disease with occluded mid LAD and significant disease in mid left circumflex.  The RCA is relatively normal.  Patent LIMA to LAD.  Occluded SVG to OM 3.  I could not find any other patent graft even with aortogram.  I suspect that there is likely an SVG to diagonal which is also occluded. 2.  Normal LV systolic function and mildly elevated left ventricular end-diastolic pressure. 3.  Successful drug-eluting stent placement to the mid left circumflex.  Recommendations:  Recommend uninterrupted dual antiplatelet therapy with Aspirin 81mg   daily and Clopidogrel 75mg  daily for a minimum of 6 months (stable ischemic heart disease - Class I recommendation).  Echo pending  Assessment & Plan     Atrial flutter 2:1 AVblock  Acute v subacute  CAD with prior CABG  DVT  Acute cholecystitis s/p lap chole  CS massage>> atrial flutter    Has converted to sinus rhythm.  Can continue amiodarone while in hospital but would discontinue at discharge.  We will need anticoagulation both for DVT probably for 12 weeks, postoperative duration, as well as atrial flutter.  We will plan outpatient follow-up in 6-8 weeks to discuss catheter ablation which I reviewed with her in general today.  This will allow for the discontinuation of anticoagulation from a atrial arrhythmia point of view.  Discussed with Dr Mady Gemma

## 2019-07-12 DIAGNOSIS — I25118 Atherosclerotic heart disease of native coronary artery with other forms of angina pectoris: Secondary | ICD-10-CM | POA: Diagnosis not present

## 2019-07-12 DIAGNOSIS — I10 Essential (primary) hypertension: Secondary | ICD-10-CM

## 2019-07-12 DIAGNOSIS — I5032 Chronic diastolic (congestive) heart failure: Secondary | ICD-10-CM | POA: Diagnosis not present

## 2019-07-12 DIAGNOSIS — I4892 Unspecified atrial flutter: Secondary | ICD-10-CM

## 2019-07-12 LAB — CULTURE, BLOOD (ROUTINE X 2)
Culture: NO GROWTH
Culture: NO GROWTH

## 2019-07-12 LAB — GLUCOSE, CAPILLARY
Glucose-Capillary: 221 mg/dL — ABNORMAL HIGH (ref 70–99)
Glucose-Capillary: 231 mg/dL — ABNORMAL HIGH (ref 70–99)
Glucose-Capillary: 137 mg/dL — ABNORMAL HIGH (ref 70–99)
Glucose-Capillary: 182 mg/dL — ABNORMAL HIGH (ref 70–99)
Glucose-Capillary: 192 mg/dL — ABNORMAL HIGH (ref 70–99)
Glucose-Capillary: 111 mg/dL — ABNORMAL HIGH (ref 70–99)

## 2019-07-12 LAB — COMPREHENSIVE METABOLIC PANEL
ALT: 17 U/L (ref 0–44)
AST: 16 U/L (ref 15–41)
Albumin: 2.5 g/dL — ABNORMAL LOW (ref 3.5–5.0)
Alkaline Phosphatase: 65 U/L (ref 38–126)
Anion gap: 10 (ref 5–15)
BUN: 9 mg/dL (ref 8–23)
CO2: 21 mmol/L — ABNORMAL LOW (ref 22–32)
Calcium: 8.7 mg/dL — ABNORMAL LOW (ref 8.9–10.3)
Chloride: 111 mmol/L (ref 98–111)
Creatinine, Ser: 1.54 mg/dL — ABNORMAL HIGH (ref 0.44–1.00)
GFR calc Af Amer: 38 mL/min — ABNORMAL LOW (ref >60)
GFR calc non Af Amer: 32 mL/min — ABNORMAL LOW (ref >60)
Glucose, Bld: 141 mg/dL — ABNORMAL HIGH (ref 70–99)
Potassium: 4 mmol/L (ref 3.5–5.1)
Sodium: 142 mmol/L (ref 135–145)
Total Bilirubin: 0.2 mg/dL — ABNORMAL LOW (ref 0.3–1.2)
Total Protein: 5.1 g/dL — ABNORMAL LOW (ref 6.5–8.1)

## 2019-07-12 LAB — CBC
HCT: 34.6 % — ABNORMAL LOW (ref 36.0–46.0)
Hemoglobin: 11.3 g/dL — ABNORMAL LOW (ref 12.0–15.0)
MCH: 30.1 pg (ref 26.0–34.0)
MCHC: 32.7 g/dL (ref 30.0–36.0)
MCV: 92.3 fL (ref 80.0–100.0)
Platelets: 174 10*3/uL (ref 150–400)
RBC: 3.75 MIL/uL — ABNORMAL LOW (ref 3.87–5.11)
RDW: 13.2 % (ref 11.5–15.5)
WBC: 6.2 10*3/uL (ref 4.0–10.5)
nRBC: 0 % (ref 0.0–0.2)

## 2019-07-12 LAB — HEPARIN LEVEL (UNFRACTIONATED): Heparin Unfractionated: 0.46 IU/mL (ref 0.30–0.70)

## 2019-07-12 LAB — MAGNESIUM: Magnesium: 1.7 mg/dL (ref 1.7–2.4)

## 2019-07-12 MED ORDER — APIXABAN 5 MG PO TABS: 5.0000 mg | ORAL_TABLET | Freq: Two times a day (BID) | ORAL | Status: DC

## 2019-07-12 MED ORDER — APIXABAN 5 MG PO TABS
10.0000 mg | ORAL_TABLET | Freq: Two times a day (BID) | ORAL | Status: DC
  Administered 2019-07-12 – 2019-07-13 (×3): 10 mg via ORAL
  Filled 2019-07-12 (×2): qty 2

## 2019-07-12 NOTE — Progress Notes (Signed)
Transitions of Care Pharmacist Note  LAURIEANNE CAVALCANTE is a 74 y.o. female that has been diagnosed with DVT and will be prescribed Xarelto (rivaroxaban) at discharge.   Patient Education: I provided the following education on Apixaban to the patient: How to take the medication Described what the medication is Signs of bleeding Signs/symptoms of VTE and stroke  Answered their questions  Discharge Medications Plan: The patient wants to have their discharge medications filled by the Transitions of Care pharmacy rather than their usual pharmacy.  The discharge orders pharmacy has been changed to the Transitions of Care pharmacy, the patient will receive a phone call regarding co-pay, and their medications will be delivered by the Transitions of Care pharmacy.    Thank you,   Lorel Monaco, PharmD PGY1 Ambulatory Care Resident [facility] # [phone]  Jul 12, 2019

## 2019-07-12 NOTE — Progress Notes (Addendum)
In addition to f/u with Dr. Caryl Comes that was made by EP team, Dr. Radford Pax also requested 3 month f/u with herself in clinic - have arranged for 8/9. Will appear on AVS automatically - I wrote reminder notation on AVS to take notice of both the Caryl Comes and Turner appts. I also spoke to patient to verbally inform her. Tyronda Vizcarrondo PA-C

## 2019-07-12 NOTE — Progress Notes (Signed)
Patient ID: Maria CADWALADER, female   DOB: 12-Dec-1945, 74 y.o.   MRN: [id number]    5 Days Post-Op  Subjective: No abdominal complaints.  Eating.  Moving her bowels.  Still with only intermittent SOB  ROS: See above, otherwise other systems negative  Objective: Vital signs in last 24 hours: Temp:  [97.4 F (36.3 C)-98.4 F (36.9 C)] 97.5 F (36.4 C) (05/03 0755) Pulse Rate:  [55-66] 59 (05/03 0755) Resp:  [13-19] 19 (05/03 0755) BP: (103-139)/(52-71) 139/66 (05/03 0755) SpO2:  [96 %-99 %] 98 % (05/03 0755) Last BM Date: 07/11/19  Intake/Output from previous day: 05/02 0701 - 05/03 0700 In: 1601.1 [P.O.:940; I.V.:661.1] Out: 560 [Urine:560] Intake/Output this shift: No intake/output data recorded.  PE: Heart: brady, regular Lungs: CTAB Abd: soft, appropriately tender, incisions c/d/i, +BS  Lab Results:  Recent Labs    07/11/19 0029 07/12/19 0302  WBC 7.7 6.2  HGB 10.9* 11.3*  HCT 33.1* 34.6*  PLT 192 174   BMET Recent Labs    07/11/19 0029 07/12/19 0302  NA 134* 142  K 4.0 4.0  CL 104 111  CO2 23 21*  GLUCOSE 319* 141*  BUN 12 9  CREATININE 1.32* 1.54*  CALCIUM 8.3* 8.7*   PT/INR No results for input(s): LABPROT, INR in the last 72 hours. CMP     Component Value Date/Time   NA 142 07/12/2019 0302   NA 140 12/11/2017 1027   K 4.0 07/12/2019 0302   CL 111 07/12/2019 0302   CO2 21 (L) 07/12/2019 0302   GLUCOSE 141 (H) 07/12/2019 0302   BUN 9 07/12/2019 0302   BUN 15 12/11/2017 1027   CREATININE 1.54 (H) 07/12/2019 0302   CALCIUM 8.7 (L) 07/12/2019 0302   PROT 5.1 (L) 07/12/2019 0302   PROT 6.1 10/17/2017 0927   ALBUMIN 2.5 (L) 07/12/2019 0302   ALBUMIN 4.0 10/17/2017 0927   AST 16 07/12/2019 0302   ALT 17 07/12/2019 0302   ALKPHOS 65 07/12/2019 0302   BILITOT 0.2 (L) 07/12/2019 0302   BILITOT 0.3 10/17/2017 0927   GFRNONAA 32 (L) 07/12/2019 0302   GFRAA 38 (L) 07/12/2019 0302   Lipase     Component Value Date/Time   LIPASE 29 07/06/2019  1906       Studies/Results: DG CHEST PORT 1 VIEW  Result Date: 07/10/2019 CLINICAL DATA:  Shortness of breath. Post cholecystectomy 07/07/2019. EXAM: PORTABLE CHEST 1 VIEW COMPARISON:  11/23/2017 and abdominal CT 07/07/2019 FINDINGS: Lungs are adequately inflated. Loop recorder over the left heart. Lungs are adequately inflated with mild patchy opacification in the left base likely small amount of pleural fluid and atelectasis. Infection in the left base is possible. Borderline stable cardiomegaly. Remainder the exam is unchanged. IMPRESSION: Left base opacification likely small effusion with atelectasis although infection is possible. Electronically Signed   By: Marin Olp M.D.   On: 07/10/2019 13:36   ECHOCARDIOGRAM COMPLETE  Result Date: 07/11/2019    ECHOCARDIOGRAM REPORT   Patient Name:   Maria Fox Date of Exam: 07/11/2019 Medical Rec #:  [id number]    Height:       62.0 in Accession #:    [id number]   Weight:       158.0 lb Date of Birth:  1945-11-12    BSA:          1.729 m Patient Age:    73 years     BP:           103/53 mmHg  Patient Gender: F            HR:           64 bpm. Exam Location:  Inpatient Procedure: 2D Echo, Color Doppler and Cardiac Doppler Indications:    Chest pain  History:        Patient has prior history of Echocardiogram examinations, most                 recent 10/17/2017. CHF, CAD, Prior CABG; Risk                 Factors:Hypertension, Dyslipidemia and Diabetes. CKD.  Sonographer:    Clayton Lefort RDCS (AE) Referring Phys: Gray  Sonographer Comments: Patient movement. IMPRESSIONS  1. Normal LV systolic function; mild LVH; mild MR and TR.  2. Left ventricular ejection fraction, by estimation, is 55 to 60%. The left ventricle has normal function. The left ventricle has no regional wall motion abnormalities. There is mild left ventricular hypertrophy. Left ventricular diastolic parameters are indeterminate.  3. Right ventricular systolic function is normal. The  right ventricular size is normal.  4. The mitral valve is normal in structure. Mild mitral valve regurgitation. No evidence of mitral stenosis.  5. The aortic valve is tricuspid. Aortic valve regurgitation is not visualized. No aortic stenosis is present. FINDINGS  Left Ventricle: Left ventricular ejection fraction, by estimation, is 55 to 60%. The left ventricle has normal function. The left ventricle has no regional wall motion abnormalities. The left ventricular internal cavity size was normal in size. There is  mild left ventricular hypertrophy. Left ventricular diastolic parameters are indeterminate. Right Ventricle: The right ventricular size is normal. Right ventricular systolic function is normal. Left Atrium: Left atrial size was normal in size. Right Atrium: Right atrial size was normal in size. Pericardium: There is no evidence of pericardial effusion. Mitral Valve: The mitral valve is normal in structure. Normal mobility of the mitral valve leaflets. Mild mitral valve regurgitation. No evidence of mitral valve stenosis. MV peak gradient, 8.0 mmHg. The mean mitral valve gradient is 2.0 mmHg. Tricuspid Valve: The tricuspid valve is normal in structure. Tricuspid valve regurgitation is mild . No evidence of tricuspid stenosis. Aortic Valve: The aortic valve is tricuspid. Aortic valve regurgitation is not visualized. No aortic stenosis is present. Aortic valve mean gradient measures 4.0 mmHg. Aortic valve peak gradient measures 7.5 mmHg. Aortic valve area, by VTI measures 1.31 cm. Pulmonic Valve: The pulmonic valve was not well visualized. Pulmonic valve regurgitation is trivial. No evidence of pulmonic stenosis. Aorta: The aortic root is normal in size and structure. Venous: The inferior vena cava was not well visualized.  Additional Comments: Normal LV systolic function; mild LVH; mild MR and TR.  LEFT VENTRICLE PLAX 2D LVIDd:         3.50 cm LVIDs:         2.60 cm LV PW:         1.40 cm LV IVS:         1.50 cm LVOT diam:     1.80 cm LV SV:         41 LV SV Index:   24 LVOT Area:     2.54 cm  RIGHT VENTRICLE RV Basal diam:  3.50 cm RV Mid diam:    3.10 cm RV S prime:     9.36 cm/s TAPSE (M-mode): 1.3 cm LEFT ATRIUM             Index  RIGHT ATRIUM           Index LA diam:        3.60 cm 2.08 cm/m  RA Area:     18.40 cm LA Vol (A2C):   50.8 ml 29.37 ml/m RA Volume:   45.40 ml  26.25 ml/m LA Vol (A4C):   45.6 ml 26.37 ml/m LA Biplane Vol: 48.5 ml 28.04 ml/m  AORTIC VALVE AV Area (Vmax):    1.32 cm AV Area (Vmean):   1.30 cm AV Area (VTI):     1.31 cm AV Vmax:           137.00 cm/s AV Vmean:          97.200 cm/s AV VTI:            0.314 m AV Peak Grad:      7.5 mmHg AV Mean Grad:      4.0 mmHg LVOT Vmax:         71.00 cm/s LVOT Vmean:        49.600 cm/s LVOT VTI:          0.162 m LVOT/AV VTI ratio: 0.52  AORTA Ao Root diam: 3.50 cm Ao Asc diam:  2.90 cm MITRAL VALVE                TRICUSPID VALVE MV Area (PHT): 3.21 cm     TR Peak grad:   29.6 mmHg MV Peak grad:  8.0 mmHg     TR Vmax:        272.00 cm/s MV Mean grad:  2.0 mmHg MV Vmax:       1.41 m/s     SHUNTS MV Vmean:      67.8 cm/s    Systemic VTI:  0.16 m MV Decel Time: 236 msec     Systemic Diam: 1.80 cm MV E velocity: 144.00 cm/s MV A velocity: 109.00 cm/s MV E/A ratio:  1.32 Kirk Ruths MD Electronically signed by Kirk Ruths MD Signature Date/Time: 07/11/2019/4:53:46 PM    Final    VAS [place] LOWER EXTREMITY VENOUS (DVT)  Result Date: 07/11/2019  Lower Venous DVTStudy Indications: Swelling.  Risk Factors: None identified. Limitations: Poor ultrasound/tissue interface. Comparison Study: No prior studies. Performing Technologist: Oliver Hum RVT  Examination Guidelines: A complete evaluation includes B-mode imaging, spectral Doppler, color Doppler, and power Doppler as needed of all accessible portions of each vessel. Bilateral testing is considered an integral part of a complete examination. Limited examinations for reoccurring  indications may be performed as noted. The reflux portion of the exam is performed with the patient in reverse Trendelenburg.  +---------+---------------+---------+-----------+----------+--------------+ RIGHT    CompressibilityPhasicitySpontaneityPropertiesThrombus Aging +---------+---------------+---------+-----------+----------+--------------+ CFV      Full           Yes      Yes                                 +---------+---------------+---------+-----------+----------+--------------+ SFJ      Full                                                        +---------+---------------+---------+-----------+----------+--------------+ FV Prox  Full                                                        +---------+---------------+---------+-----------+----------+--------------+  FV Mid   Full                                                        +---------+---------------+---------+-----------+----------+--------------+ FV DistalFull                                                        +---------+---------------+---------+-----------+----------+--------------+ PFV      Full                                                        +---------+---------------+---------+-----------+----------+--------------+ POP      Full           Yes      Yes                                 +---------+---------------+---------+-----------+----------+--------------+ PTV      Full                                                        +---------+---------------+---------+-----------+----------+--------------+ PERO     Full                                                        +---------+---------------+---------+-----------+----------+--------------+   +---------+---------------+---------+-----------+----------+--------------+ LEFT     CompressibilityPhasicitySpontaneityPropertiesThrombus Aging  +---------+---------------+---------+-----------+----------+--------------+ CFV      Full           Yes      Yes                                 +---------+---------------+---------+-----------+----------+--------------+ SFJ      Full                                                        +---------+---------------+---------+-----------+----------+--------------+ FV Prox  Full                                                        +---------+---------------+---------+-----------+----------+--------------+ FV Mid   Full                                                        +---------+---------------+---------+-----------+----------+--------------+   FV DistalFull                                                        +---------+---------------+---------+-----------+----------+--------------+ PFV      Full                                                        +---------+---------------+---------+-----------+----------+--------------+ POP      Full           Yes      Yes                                 +---------+---------------+---------+-----------+----------+--------------+ PTV      Partial                                      Acute          +---------+---------------+---------+-----------+----------+--------------+ PERO     Full                                                        +---------+---------------+---------+-----------+----------+--------------+     Summary: RIGHT: - There is no evidence of deep vein thrombosis in the lower extremity.  - No cystic structure found in the popliteal fossa.  LEFT: - Findings consistent with acute deep vein thrombosis involving the left posterior tibial veins. - No cystic structure found in the popliteal fossa.  *See table(s) above for measurements and observations. Electronically signed by Monica Martinez MD on 07/11/2019 at 11:02:35 AM.    Final     Anti-infectives: Anti-infectives (From admission, onward)     Start     Dose/Rate Route Frequency Ordered Stop   07/08/19 0500  cefTRIAXone (ROCEPHIN) 2 g in sodium chloride 0.9 % 100 mL IVPB  Status:  Discontinued     2 g 200 mL/hr over 30 Minutes Intravenous Every 24 hours 07/07/19 0839 07/07/19 1802   07/07/19 0245  cefTRIAXone (ROCEPHIN) 2 g in sodium chloride 0.9 % 100 mL IVPB     2 g 200 mL/hr over 30 Minutes Intravenous  Once 07/07/19 0231 07/07/19 0451       Assessment/Plan Hypertension Hx CHF CAD with Hx CABG/PTCA stents- on Plavix@ home Hx CVA Type 2 diabetes Neuropathy CKD Hx insomnia Iron/B12 deficiencies AKI Atrial flutter  Acute DVT --per [facility]--  Acute cholecystitis - s/p Lap Chole, 4/28, Dr. Ninfa Linden, POD #5 -labs normal and recovering as expected -undergoing chest pain/sbo eval right now, dont think that needs any further eval for abdomen at this point.  Will see every other day while here.  FEN:HH/CM ID: [email or web address] further abx indicated. DVT: SCDs,IV heparin, ok to transition to oral anticoagulation when indicated Follow-up:DOW   LOS: 5 days    Henreitta Cea , [facility] 07/12/2019, 9:10 AM Please see Amion for pager number during day hours 7:00am-4:30pm or  7:00am -11:30am on weekends

## 2019-07-12 NOTE — Progress Notes (Signed)
Progress Note  Patient Name: Maria Fox Date of Encounter: 07/12/2019  Primary Cardiologist: Fransico Him, MD   Patient Profile     74 y.o. female w/ PMH of CAD (s/p CABG in 2016 with repeat LHC in 2019 showing occluded LAD and significant disease in the mid LCx with PCI to LCx 11/2017 with LIMA to LAD-patent, SVG to OM 3-occluded, SVG to diagonal could not be found however was assumed to be occluded as well), HLD, HTN, DM 2, chronic diastolic dysfunction (not on diuretic therapy secondary to orthostatic hypotension),CVA in 2016, and autoimmune deficiency syndrome who is currently admitted for acute cholecystitis. Cardiology initially consulted for clearance and underwent lap chole on 4/28. Called back to see the patient on 07/10/2019 for tachycardia.  Venous dopplers >>+ DVT  ECG SVT at 150  Carotid massage >> aflutter w 2:1 AVB  Subjective   Now back in NSR.  Denies any palpitations, CP or SOB>.  Inpatient Medications    Scheduled Meds: . apixaban  10 mg Oral BID   Followed by  . [START ON 07/19/2019] apixaban  5 mg Oral BID  . atorvastatin  40 mg Oral Daily  . docusate sodium  100 mg Oral BID  . gabapentin  100 mg Oral TID  . insulin aspart  0-15 Units Subcutaneous TID WC  . insulin aspart  0-5 Units Subcutaneous QHS  . insulin glargine  5 Units Subcutaneous Daily  . isosorbide mononitrate  15 mg Oral Daily  . latanoprost  1 drop Both Eyes QHS  . metoprolol tartrate  12.5 mg Oral BID   Continuous Infusions: . amiodarone 30 mg/hr (07/12/19 0400)  . heparin 1,000 Units/hr (07/12/19 0400)   PRN Meds: acetaminophen **OR** acetaminophen, albuterol, bisacodyl, hydrALAZINE, HYDROcodone-acetaminophen, metoprolol tartrate, morphine injection, ondansetron (ZOFRAN) IV, polyethylene glycol, traZODone   Vital Signs    Vitals:   07/11/19 2048 07/12/19 0040 07/12/19 0351 07/12/19 0755  BP: (!) 115/58 (!) 112/56 (!) 110/52 139/66  Pulse: 66 60 (!) 55 (!) 59  Resp: 13 16 14 19     Temp: (!) 97.4 F (36.3 C) (!) 97.4 F (36.3 C) (!) 97.5 F (36.4 C) (!) 97.5 F (36.4 C)  TempSrc: Oral Oral Oral Oral  SpO2: 99% 96% 96% 98%  Weight:      Height:        Intake/Output Summary (Last 24 hours) at 07/12/2019 0843 Last data filed at 07/12/2019 0400 Gross per 24 hour  Intake 1361.09 ml  Output --  Net 1361.09 ml    Last 3 Weights 07/10/2019 07/06/2019 04/08/2019  Weight (lbs) 158 lb 135 lb 154 lb  Weight (kg) 71.668 kg 61.236 kg 69.854 kg      Telemetry    NSR-- Personally Reviewed  ECG    No new EKG to review  - Personally Reviewed  Physical Exam   GEN: Well nourished, well developed in no acute distress HEENT: Normal NECK: No JVD; No carotid bruits LYMPHATICS: No lymphadenopathy CARDIAC:RRR, no murmurs, rubs, gallops RESPIRATORY:  Clear to auscultation without rales, wheezing or rhonchi  ABDOMEN: Soft, non-tender, non-distended MUSCULOSKELETAL:  No edema; No deformity  SKIN: Warm and dry NEUROLOGIC:  Alert and oriented x 3 PSYCHIATRIC:  Normal affect    Labs    Chemistry Recent Labs  Lab 07/10/19 0247 07/11/19 0029 07/12/19 0302  NA 138 134* 142  K 3.9 4.0 4.0  CL 113* 104 111  CO2 20* 23 21*  GLUCOSE 217* 319* 141*  BUN 14 12 9  CREATININE 1.42* 1.32* 1.54*  CALCIUM 7.4* 8.3* 8.7*  PROT 4.5* 5.1* 5.1*  ALBUMIN 2.0* 2.4* 2.5*  AST 17 18 16   ALT 19 20 17   ALKPHOS 52 66 65  BILITOT 0.3 0.4 0.2*  GFRNONAA 36* 39* 32*  GFRAA 41* 45* 38*  ANIONGAP 5 7 10      Hematology Recent Labs  Lab 07/10/19 0247 07/11/19 0029 07/12/19 0302  WBC 7.1 7.7 6.2  RBC 3.49* 3.66* 3.75*  HGB 10.4* 10.9* 11.3*  HCT 32.1* 33.1* 34.6*  MCV 92.0 90.4 92.3  MCH 29.8 29.8 30.1  MCHC 32.4 32.9 32.7  RDW 13.2 13.2 13.2  PLT 159 192 174    Cardiac EnzymesNo results for input(s): TROPONINI in the last 168 hours. No results for input(s): TROPIPOC in the last 168 hours.   BNP Recent Labs  Lab 07/10/19 1027  BNP 1,095.8*     DDimer  Recent Labs   Lab 07/10/19 1027  DDIMER 3.38*     Radiology    DG CHEST PORT 1 VIEW  Result Date: 07/10/2019 CLINICAL DATA:  Shortness of breath. Post cholecystectomy 07/07/2019. EXAM: PORTABLE CHEST 1 VIEW COMPARISON:  11/23/2017 and abdominal CT 07/07/2019 FINDINGS: Lungs are adequately inflated. Loop recorder over the left heart. Lungs are adequately inflated with mild patchy opacification in the left base likely small amount of pleural fluid and atelectasis. Infection in the left base is possible. Borderline stable cardiomegaly. Remainder the exam is unchanged. IMPRESSION: Left base opacification likely small effusion with atelectasis although infection is possible. Electronically Signed   By: Marin Olp M.D.   On: 07/10/2019 13:36   ECHOCARDIOGRAM COMPLETE  Result Date: 07/11/2019    ECHOCARDIOGRAM REPORT   Patient Name:   Maria Fox Date of Exam: 07/11/2019 Medical Rec #:  [id number]    Height:       62.0 in Accession #:    [id number]   Weight:       158.0 lb Date of Birth:  12/30/1945    BSA:          1.729 m Patient Age:    44 years     BP:           103/53 mmHg Patient Gender: F            HR:           64 bpm. Exam Location:  Inpatient Procedure: 2D Echo, Color Doppler and Cardiac Doppler Indications:    Chest pain  History:        Patient has prior history of Echocardiogram examinations, most                 recent 10/17/2017. CHF, CAD, Prior CABG; Risk                 Factors:Hypertension, Dyslipidemia and Diabetes. CKD.  Sonographer:    Clayton Lefort RDCS (AE) Referring Phys: [place]  Sonographer Comments: Patient movement. IMPRESSIONS  1. Normal LV systolic function; mild LVH; mild MR and TR.  2. Left ventricular ejection fraction, by estimation, is 55 to 60%. The left ventricle has normal function. The left ventricle has no regional wall motion abnormalities. There is mild left ventricular hypertrophy. Left ventricular diastolic parameters are indeterminate.  3. Right ventricular systolic  function is normal. The right ventricular size is normal.  4. The mitral valve is normal in structure. Mild mitral valve regurgitation. No evidence of mitral stenosis.  5. The aortic valve is tricuspid. Aortic  valve regurgitation is not visualized. No aortic stenosis is present. FINDINGS  Left Ventricle: Left ventricular ejection fraction, by estimation, is 55 to 60%. The left ventricle has normal function. The left ventricle has no regional wall motion abnormalities. The left ventricular internal cavity size was normal in size. There is  mild left ventricular hypertrophy. Left ventricular diastolic parameters are indeterminate. Right Ventricle: The right ventricular size is normal. Right ventricular systolic function is normal. Left Atrium: Left atrial size was normal in size. Right Atrium: Right atrial size was normal in size. Pericardium: There is no evidence of pericardial effusion. Mitral Valve: The mitral valve is normal in structure. Normal mobility of the mitral valve leaflets. Mild mitral valve regurgitation. No evidence of mitral valve stenosis. MV peak gradient, 8.0 mmHg. The mean mitral valve gradient is 2.0 mmHg. Tricuspid Valve: The tricuspid valve is normal in structure. Tricuspid valve regurgitation is mild . No evidence of tricuspid stenosis. Aortic Valve: The aortic valve is tricuspid. Aortic valve regurgitation is not visualized. No aortic stenosis is present. Aortic valve mean gradient measures 4.0 mmHg. Aortic valve peak gradient measures 7.5 mmHg. Aortic valve area, by VTI measures 1.31 cm. Pulmonic Valve: The pulmonic valve was not well visualized. Pulmonic valve regurgitation is trivial. No evidence of pulmonic stenosis. Aorta: The aortic root is normal in size and structure. Venous: The inferior vena cava was not well visualized.  Additional Comments: Normal LV systolic function; mild LVH; mild MR and TR.  LEFT VENTRICLE PLAX 2D LVIDd:         3.50 cm LVIDs:         2.60 cm LV PW:          1.40 cm LV IVS:        1.50 cm LVOT diam:     1.80 cm LV SV:         41 LV SV Index:   24 LVOT Area:     2.54 cm  RIGHT VENTRICLE RV Basal diam:  3.50 cm RV Mid diam:    3.10 cm RV S prime:     9.36 cm/s TAPSE (M-mode): 1.3 cm LEFT ATRIUM             Index       RIGHT ATRIUM           Index LA diam:        3.60 cm 2.08 cm/m  RA Area:     18.40 cm LA Vol (A2C):   50.8 ml 29.37 ml/m RA Volume:   45.40 ml  26.25 ml/m LA Vol (A4C):   45.6 ml 26.37 ml/m LA Biplane Vol: 48.5 ml 28.04 ml/m  AORTIC VALVE AV Area (Vmax):    1.32 cm AV Area (Vmean):   1.30 cm AV Area (VTI):     1.31 cm AV Vmax:           137.00 cm/s AV Vmean:          97.200 cm/s AV VTI:            0.314 m AV Peak Grad:      7.5 mmHg AV Mean Grad:      4.0 mmHg LVOT Vmax:         71.00 cm/s LVOT Vmean:        49.600 cm/s LVOT VTI:          0.162 m LVOT/AV VTI ratio: 0.52  AORTA Ao Root diam: 3.50 cm Ao Asc diam:  2.90 cm MITRAL VALVE  TRICUSPID VALVE MV Area (PHT): 3.21 cm     TR Peak grad:   29.6 mmHg MV Peak grad:  8.0 mmHg     TR Vmax:        272.00 cm/s MV Mean grad:  2.0 mmHg MV Vmax:       1.41 m/s     SHUNTS MV Vmean:      67.8 cm/s    Systemic VTI:  0.16 m MV Decel Time: 236 msec     Systemic Diam: 1.80 cm MV E velocity: 144.00 cm/s MV A velocity: 109.00 cm/s MV E/A ratio:  1.32 Kirk Ruths MD Electronically signed by Kirk Ruths MD Signature Date/Time: 07/11/2019/4:53:46 PM    Final    VAS [place] LOWER EXTREMITY VENOUS (DVT)  Result Date: 07/11/2019  Lower Venous DVTStudy Indications: Swelling.  Risk Factors: None identified. Limitations: Poor ultrasound/tissue interface. Comparison Study: No prior studies. Performing Technologist: Oliver Hum RVT  Examination Guidelines: A complete evaluation includes B-mode imaging, spectral Doppler, color Doppler, and power Doppler as needed of all accessible portions of each vessel. Bilateral testing is considered an integral part of a complete examination. Limited examinations for  reoccurring indications may be performed as noted. The reflux portion of the exam is performed with the patient in reverse Trendelenburg.  +---------+---------------+---------+-----------+----------+--------------+ RIGHT    CompressibilityPhasicitySpontaneityPropertiesThrombus Aging +---------+---------------+---------+-----------+----------+--------------+ CFV      Full           Yes      Yes                                 +---------+---------------+---------+-----------+----------+--------------+ SFJ      Full                                                        +---------+---------------+---------+-----------+----------+--------------+ FV Prox  Full                                                        +---------+---------------+---------+-----------+----------+--------------+ FV Mid   Full                                                        +---------+---------------+---------+-----------+----------+--------------+ FV DistalFull                                                        +---------+---------------+---------+-----------+----------+--------------+ PFV      Full                                                        +---------+---------------+---------+-----------+----------+--------------+ POP      Full  Yes      Yes                                 +---------+---------------+---------+-----------+----------+--------------+ PTV      Full                                                        +---------+---------------+---------+-----------+----------+--------------+ PERO     Full                                                        +---------+---------------+---------+-----------+----------+--------------+   +---------+---------------+---------+-----------+----------+--------------+ LEFT     CompressibilityPhasicitySpontaneityPropertiesThrombus Aging  +---------+---------------+---------+-----------+----------+--------------+ CFV      Full           Yes      Yes                                 +---------+---------------+---------+-----------+----------+--------------+ SFJ      Full                                                        +---------+---------------+---------+-----------+----------+--------------+ FV Prox  Full                                                        +---------+---------------+---------+-----------+----------+--------------+ FV Mid   Full                                                        +---------+---------------+---------+-----------+----------+--------------+ FV DistalFull                                                        +---------+---------------+---------+-----------+----------+--------------+ PFV      Full                                                        +---------+---------------+---------+-----------+----------+--------------+ POP      Full           Yes      Yes                                 +---------+---------------+---------+-----------+----------+--------------+ PTV  Partial                                      Acute          +---------+---------------+---------+-----------+----------+--------------+ PERO     Full                                                        +---------+---------------+---------+-----------+----------+--------------+     Summary: RIGHT: - There is no evidence of deep vein thrombosis in the lower extremity.  - No cystic structure found in the popliteal fossa.  LEFT: - Findings consistent with acute deep vein thrombosis involving the left posterior tibial veins. - No cystic structure found in the popliteal fossa.  *See table(s) above for measurements and observations. Electronically signed by Monica Martinez MD on 07/11/2019 at 11:02:35 AM.    Final     Cardiac Studies   Echocardiogram: 10/2017 Study  Conclusions   - Left ventricle: The cavity size was normal. Wall thickness was  normal. Systolic function was normal. The estimated ejection  fraction was in the range of 55% to 60%. Wall motion was normal;  there were no regional wall motion abnormalities. Doppler  parameters are consistent with abnormal left ventricular  relaxation (grade 1 diastolic dysfunction). The E/e&' ratio is  between 8-15, suggesting indeterminate LV filling pressure.  - Mitral valve: Mildly thickened leaflets . There was trivial  regurgitation.  - Left atrium: The atrium was normal in size.  - Tricuspid valve: There was trivial regurgitation.  - Pulmonary arteries: PA peak pressure: 33 mm Hg (S) + RAP.   Impressions:   - Compared to a prior study in 2018, the LVEF is unchanged,.   Cardiac Catheterization: 11/2017  The left ventricular systolic function is normal.  LV end diastolic pressure is mildly elevated.  The left ventricular ejection fraction is 55-65% by visual estimate.  Mid Cx lesion is 80% stenosed.  Post intervention, there is a 0% residual stenosis.  A drug-eluting stent was successfully placed using a STENT SIERRA 3.00 X 15 MM.  Prox LAD to Mid LAD lesion is 100% stenosed.  SVG graft was visualized by angiography.  Origin lesion is 100% stenosed.  LIMA graft was visualized by angiography and is normal in caliber.  The graft exhibits no disease.   1.  Significant underlying two-vessel coronary artery disease with occluded mid LAD and significant disease in mid left circumflex.  The RCA is relatively normal.  Patent LIMA to LAD.  Occluded SVG to OM 3.  I could not find any other patent graft even with aortogram.  I suspect that there is likely an SVG to diagonal which is also occluded. 2.  Normal LV systolic function and mildly elevated left ventricular end-diastolic pressure. 3.  Successful drug-eluting stent placement to the mid left  circumflex.  Recommendations:  Recommend uninterrupted dual antiplatelet therapy with Aspirin 81mg  daily and Clopidogrel 75mg  daily for a minimum of 6 months (stable ischemic heart disease - Class I recommendation).     Assessment & Plan    1. Narrow-complex Tachycardia - the patient underwent lap chole on 07/07/2019 and is being followed by General Surgery and Hospitalist team.  -over the weekend developed acute onset dyspnea and was found to  be tachycardiac with HR in the 140's. BNP elevated to 1095 and D-dimer 3.38 (LE venous dopplers showed acute left LE DVT) -after carotid massage, noted to be in aflutter with 2:1 block -converted to NSR yesterday -This patients CHA2DS2-VASc Score and unadjusted Ischemic Stroke Rate (% per year) is equal to 11.2 % stroke rate/year from a score of 7 (HTN, DM, Female, Age (2), CVA(2)).  -She is now on DVT dosing for DVT/PE with Apixaban 10mg  BID -continue IV Amio until discharge and then stop -will followup outpt with EP to consider eventual aflutter ablation  2. CAD - s/p CABG in 2016 with repeat LHC in 2019 showing occluded LAD and significant disease in the mid LCx with PCI to LCx 11/2017 with LIMA to LAD-patent, SVG to OM 3-occluded, SVG to diagonal could not be found however was assumed to be occluded as well. - no recent anginal symptoms. HS Troponin values flat this AM.  - continue statin, Imdur and BB therapy.  -no ASA due to DOAC -restart Plavix once patient down to Eliquis 5mg  BID  3. Acute Cholecystitis - s/p lap cholecystectomy on 07/07/2019. Per surgery and admitting team.   For questions or updates, please contact [facility] Please consult [email or web address] for contact info under Cardiology/STEMI.  [facility] will sign off.   Medication Recommendations:  Eliquis 10mg  BID x 7 days and then 5mg  BID, atorvastatin 40mg  daily, Imdur 15mg  daily, Lopressor 12.5mg  BID, Lasix 20mg  daily. Restart Plavix 75mg  daily once Eliquis had  decreased to 5mg  BID.  No ASA.  Other recommendations (labs, testing, etc):  none Follow up as an outpatient:  outpt followup in 6 weeks with Dr. Caryl Comes   Signed, Fransico Him , PA-C 8:43 AM 07/12/2019 Pager: [phone]

## 2019-07-12 NOTE — Progress Notes (Signed)
ANTICOAGULATION CONSULT NOTE - Follow Up Consult  Pharmacy Consult for Heparin Indication: DVT +/- PE and Aflutter  Labs: Recent Labs     0000 07/10/19 0247 07/10/19 1027 07/10/19 1214 07/10/19 2327 07/11/19 0029 07/11/19 1608 07/12/19 0302  HGB   < > 10.4*  --   --   --  10.9*  --  11.3*  HCT  --  32.1*  --   --   --  33.1*  --  34.6*  PLT  --  159  --   --   --  192  --  174  HEPARINUNFRC  --   --   --   --    < > 0.33 0.71* 0.46  CREATININE  --  1.42*  --   --   --  1.32*  --  1.54*  TROPONINIHS  --   --  32* 25*  --   --   --   --    < > = values in this interval not displayed.    Assessment/Plan:  74yo female therapeutic on heparin after rate changes. Will continue gtt at current rate and confirm stable with additional level.     Wynona Neat, PharmD, BCPS  07/12/2019,4:49 AM

## 2019-07-12 NOTE — Progress Notes (Signed)
PROGRESS NOTE  Maria Fox [name] DOB: 27-Jan-1946 DOA: 07/06/2019 PCP: [name], MD   LOS: 5 days   Brief Narrative / Interim history: 74 year old female with HTN, HLD, DM, history of CVA, CAD status post CABG, came into the hospital with abdominal pain, mainly on the right side which started about 1 to 2 weeks ago.  This is associated with nausea and vomiting.  In the ED she was diagnosed with acute cholecystitis and general surgery took her to the OR shortly after admission on 4/28  Subjective / 24h Interval events: She is still not feeling very well this morning.  Complains of poor p.o. intake.  No chest pain, no shortness of breath  Assessment & Plan: Principal Problem Acute cholecystitis-General surgery consulted, she is status post laparoscopic cholecystectomy on 4/28.  Appreciate surgery follow-up, seems to be recovering well  Active Problems Acute DVT, presumed pulmonary embolism-patient with chest pain and new onset a flutter on 5/1.  D-dimer was elevated and she was empirically started on heparin out of concern for PE.  Lower extremity Dopplers did show an acute DVT.  CT angiogram was initially ordered however she does have an allergy to IV contrast and borderline renal function, and given acute DVT I would presume she also has a PE and CT scan was canceled -Hemoglobin has remained stable, she has no evidence of bleeding, will transition patient to Eliquis today.  May need is the last 3 months anticoagulation given provoked DVT in the setting of perioperative timing but ultimate length for anticoagulation will be decided as an outpatient  A flutter/chest pain-cardiology consult appreciated, she was started on amiodarone and now she is in sinus rhythm.  Continue beta-blockers, now off amiodarone  Acute hypoxic respiratory failure-patient still on oxygen this morning wean off as tolerated, sit in the chair, PT consulted   Acute kidney injury on chronic kidney disease  stage IIIa-creatinine overall stable although increasing slightly today probably due to her Lasix doses on 5/1.  Avoid nephrotoxins, will not give IV fluids at this point.  Repeat renal function in the morning  Essential hypertension-continue metoprolol  Type 2 diabetes mellitus-cover with sliding scale  CBG (last 3)  Recent Labs    07/11/19 1643 07/11/19 2154 07/12/19 0632  GLUCAP 204* 128* 137*   Hyperlipidemia-continue atorvastatin  Coronary artery disease, history of chronic diastolic CHF, history of CABG-no chest pain this morning.  Troponins flat   Scheduled Meds: . apixaban  10 mg Oral BID   Followed by  . [START ON 07/19/2019] apixaban  5 mg Oral BID  . atorvastatin  40 mg Oral Daily  . docusate sodium  100 mg Oral BID  . gabapentin  100 mg Oral TID  . insulin aspart  0-15 Units Subcutaneous TID WC  . insulin aspart  0-5 Units Subcutaneous QHS  . insulin glargine  5 Units Subcutaneous Daily  . isosorbide mononitrate  15 mg Oral Daily  . latanoprost  1 drop Both Eyes QHS  . metoprolol tartrate  12.5 mg Oral BID   Continuous Infusions: . amiodarone 30 mg/hr (07/12/19 0400)   PRN Meds:.acetaminophen **OR** acetaminophen, albuterol, bisacodyl, hydrALAZINE, HYDROcodone-acetaminophen, metoprolol tartrate, morphine injection, ondansetron (ZOFRAN) IV, polyethylene glycol, traZODone  DVT prophylaxis: SCD Code Status: Full code Family Communication: no family at bedside, discussed with the patient  Status is: Inpatient  Remains inpatient appropriate because:Persistent abdominal symptoms, increasing creatinine, hypoxia  Dispo: The patient is from: Home  Anticipated d/c is to: Home              Anticipated d/c date is: 2 days              Patient currently is not medically stable to d/c.  Consultants:  General surgery Cardiology  Procedures:  None   Microbiology  None   Antimicrobials: None     Objective: Vitals:   07/11/19 2048 07/12/19 0040  07/12/19 0351 07/12/19 0755  BP: (!) 115/58 (!) 112/56 (!) 110/52 139/66  Pulse: 66 60 (!) 55 (!) 59  Resp: 13 16 14 19   Temp: (!) 97.4 F (36.3 C) (!) 97.4 F (36.3 C) (!) 97.5 F (36.4 C) (!) 97.5 F (36.4 C)  TempSrc: Oral Oral Oral Oral  SpO2: 99% 96% 96% 98%  Weight:      Height:        Intake/Output Summary (Last 24 hours) at 07/12/2019 0950 Last data filed at 07/12/2019 0400 Gross per 24 hour  Intake 1361.09 ml  Output --  Net 1361.09 ml   Filed Weights   07/06/19 1842 07/10/19 1600  Weight: 61.2 kg 71.7 kg    Examination:  Constitutional: No distress, in bed Eyes: No scleral icterus ENMT: Moist mucous membranes Respiratory: Diminished at the bases but clear overall, no wheezing, no crackles Cardiovascular: Regular rate and rhythm, no edema, no murmurs Abdomen: Soft, mild tenderness present but no guarding or rebound, nondistended, bowel sounds positive Musculoskeletal: no clubbing / cyanosis.  Skin: No rashes Neurologic: Nonfocal  Data Reviewed: I have independently reviewed following labs and imaging studies   CBC: Recent Labs  Lab 07/08/19 0136 07/09/19 0148 07/10/19 0247 07/11/19 0029 07/12/19 0302  WBC 12.2* 9.6 7.1 7.7 6.2  HGB 13.0 10.8* 10.4* 10.9* 11.3*  HCT 39.0 33.2* 32.1* 33.1* 34.6*  MCV 89.0 91.2 92.0 90.4 92.3  PLT 204 186 159 192 [id number]   Basic Metabolic Panel: Recent Labs  Lab 07/08/19 0136 07/09/19 0148 07/10/19 0247 07/11/19 0029 07/12/19 0302  NA 134* 135 138 134* 142  K 4.3 4.1 3.9 4.0 4.0  CL 104 107 113* 104 111  CO2 20* 23 20* 23 21*  GLUCOSE 368* 279* 217* 319* 141*  BUN 9 20 14 12 9   CREATININE 1.16* 1.94* 1.42* 1.32* 1.54*  CALCIUM 8.5* 8.1* 7.4* 8.3* 8.7*  MG  --   --   --   --  1.7   Liver Function Tests: Recent Labs  Lab 07/06/19 1906 07/09/19 0148 07/10/19 0247 07/11/19 0029 07/12/19 0302  AST 20 27 17 18 16   ALT 23 23 19 20 17   ALKPHOS 107 59 52 66 65  BILITOT 0.6 0.6 0.3 0.4 0.2*  PROT 6.5 5.1* 4.5*  5.1* 5.1*  ALBUMIN 3.6 2.5* 2.0* 2.4* 2.5*   Coagulation Profile: No results for input(s): INR, PROTIME in the last 168 hours. HbA1C: No results for input(s): HGBA1C in the last 72 hours. CBG: Recent Labs  Lab 07/10/19 2112 07/11/19 1153 07/11/19 1643 07/11/19 2154 07/12/19 0632  GLUCAP 309* 246* 204* 128* 137*    Recent Results (from the past 240 hour(s))  Culture, blood (Routine X 2) w Reflex to ID Panel     Status: None   Collection Time: 07/07/19  2:50 AM   Specimen: BLOOD  Result Value Ref Range Status   Specimen Description BLOOD LEFT ARM  Final   Special Requests   Final    BOTTLES DRAWN AEROBIC AND ANAEROBIC Blood Culture results may not  be optimal due to an excessive volume of blood received in culture bottles   Culture   Final    NO GROWTH 5 DAYS Performed at [facility], [place] [address] [place], [place] [address]    Report Status 07/12/2019 FINAL  Final  Culture, blood (Routine X 2) w Reflex to ID Panel     Status: None   Collection Time: 07/07/19  3:10 AM   Specimen: BLOOD  Result Value Ref Range Status   Specimen Description BLOOD RIGHT FOREARM  Final   Special Requests   Final    BOTTLES DRAWN AEROBIC ONLY Blood Culture results may not be optimal due to an inadequate volume of blood received in culture bottles   Culture   Final    NO GROWTH 5 DAYS Performed at [facility], [place] [address], [place], [place] [address]    Report Status 07/12/2019 FINAL  Final  Respiratory Panel by RT PCR (Flu A&B, Covid) - Nasopharyngeal Swab     Status: None   Collection Time: 07/07/19  6:35 AM   Specimen: Nasopharyngeal Swab  Result Value Ref Range Status   SARS Coronavirus 2 by RT PCR NEGATIVE NEGATIVE Final    Comment: (NOTE) SARS-CoV-2 target nucleic acids are NOT DETECTED. The SARS-CoV-2 RNA is generally detectable in upper respiratoy specimens during the acute phase of infection. The lowest concentration of SARS-CoV-2 viral copies this assay can  detect is 131 copies/mL. A negative result does not preclude SARS-Cov-2 infection and should not be used as the sole basis for treatment or other patient management decisions. A negative result may occur with  improper specimen collection/handling, submission of specimen other than nasopharyngeal swab, presence of viral mutation(s) within the areas targeted by this assay, and inadequate number of viral copies (<131 copies/mL). A negative result must be combined with clinical observations, patient history, and epidemiological information. The expected result is Negative. Fact Sheet for Patients:  PinkCheek.be Fact Sheet for Healthcare Providers:  GravelBags.it This test is not yet ap proved or cleared by the [place] FDA and  has been authorized for detection and/or diagnosis of SARS-CoV-2 by FDA under an Emergency Use Authorization (EUA). This EUA will remain  in effect (meaning this test can be used) for the duration of the COVID-19 declaration under Section 564(b)(1) of the Act, 21 U.S.C. section 360bbb-3(b)(1), unless the authorization is terminated or revoked sooner.    Influenza A by PCR NEGATIVE NEGATIVE Final   Influenza B by PCR NEGATIVE NEGATIVE Final    Comment: (NOTE) The Xpert Xpress SARS-CoV-2/FLU/RSV assay is intended as an aid in  the diagnosis of influenza from Nasopharyngeal swab specimens and  should not be used as a sole basis for treatment. Nasal washings and  aspirates are unacceptable for Xpert Xpress SARS-CoV-2/FLU/RSV  testing. Fact Sheet for Patients: PinkCheek.be Fact Sheet for Healthcare Providers: GravelBags.it This test is not yet approved or cleared by the [place] FDA and  has been authorized for detection and/or diagnosis of SARS-CoV-2 by  FDA under an Emergency Use Authorization (EUA). This EUA will remain  in effect (meaning  this test can be used) for the duration of the  Covid-19 declaration under Section 564(b)(1) of the Act, 21  U.S.C. section 360bbb-3(b)(1), unless the authorization is  terminated or revoked. Performed at [facility], [place] [address] [place], [place] [address]   Culture, Urine     Status: Abnormal   Collection Time: 07/09/19 12:12 PM   Specimen:  Urine, Random  Result Value Ref Range Status   Specimen Description URINE, RANDOM  Final   Special Requests NONE  Final   Culture (A)  Final    <10,000 COLONIES/mL INSIGNIFICANT GROWTH Performed at [facility], [address] [place], [place] [address]    Report Status 07/10/2019 FINAL  Final     Radiology Studies: ECHOCARDIOGRAM COMPLETE  Result Date: 07/11/2019    ECHOCARDIOGRAM REPORT   Patient Name:   TASIA FRANTOM Date of Exam: 07/11/2019 Medical Rec #:  [id number]    Height:       62.0 in Accession #:    [id number]   Weight:       158.0 lb Date of Birth:  10-14-1945    BSA:          1.729 m Patient Age:    32 years     BP:           103/53 mmHg Patient Gender: F            HR:           64 bpm. Exam Location:  Inpatient Procedure: 2D Echo, Color Doppler and Cardiac Doppler Indications:    Chest pain  History:        Patient has prior history of Echocardiogram examinations, most                 recent 10/17/2017. CHF, CAD, Prior CABG; Risk                 Factors:Hypertension, Dyslipidemia and Diabetes. CKD.  Sonographer:    Clayton Lefort RDCS (AE) Referring Phys: [place]  Sonographer Comments: Patient movement. IMPRESSIONS  1. Normal LV systolic function; mild LVH; mild MR and TR.  2. Left ventricular ejection fraction, by estimation, is 55 to 60%. The left ventricle has normal function. The left ventricle has no regional wall motion abnormalities. There is mild left ventricular hypertrophy. Left ventricular diastolic parameters are indeterminate.  3. Right ventricular systolic function is normal. The right ventricular size is  normal.  4. The mitral valve is normal in structure. Mild mitral valve regurgitation. No evidence of mitral stenosis.  5. The aortic valve is tricuspid. Aortic valve regurgitation is not visualized. No aortic stenosis is present. FINDINGS  Left Ventricle: Left ventricular ejection fraction, by estimation, is 55 to 60%. The left ventricle has normal function. The left ventricle has no regional wall motion abnormalities. The left ventricular internal cavity size was normal in size. There is  mild left ventricular hypertrophy. Left ventricular diastolic parameters are indeterminate. Right Ventricle: The right ventricular size is normal. Right ventricular systolic function is normal. Left Atrium: Left atrial size was normal in size. Right Atrium: Right atrial size was normal in size. Pericardium: There is no evidence of pericardial effusion. Mitral Valve: The mitral valve is normal in structure. Normal mobility of the mitral valve leaflets. Mild mitral valve regurgitation. No evidence of mitral valve stenosis. MV peak gradient, 8.0 mmHg. The mean mitral valve gradient is 2.0 mmHg. Tricuspid Valve: The tricuspid valve is normal in structure. Tricuspid valve regurgitation is mild . No evidence of tricuspid stenosis. Aortic Valve: The aortic valve is tricuspid. Aortic valve regurgitation is not visualized. No aortic stenosis is present. Aortic valve mean gradient measures 4.0 mmHg. Aortic valve peak gradient measures 7.5 mmHg. Aortic valve area, by VTI measures 1.31 cm. Pulmonic Valve: The pulmonic valve was not well visualized. Pulmonic valve regurgitation is trivial.  No evidence of pulmonic stenosis. Aorta: The aortic root is normal in size and structure. Venous: The inferior vena cava was not well visualized.  Additional Comments: Normal LV systolic function; mild LVH; mild MR and TR.  LEFT VENTRICLE PLAX 2D LVIDd:         3.50 cm LVIDs:         2.60 cm LV PW:         1.40 cm LV IVS:        1.50 cm LVOT diam:     1.80  cm LV SV:         41 LV SV Index:   24 LVOT Area:     2.54 cm  RIGHT VENTRICLE RV Basal diam:  3.50 cm RV Mid diam:    3.10 cm RV S prime:     9.36 cm/s TAPSE (M-mode): 1.3 cm LEFT ATRIUM             Index       RIGHT ATRIUM           Index LA diam:        3.60 cm 2.08 cm/m  RA Area:     18.40 cm LA Vol (A2C):   50.8 ml 29.37 ml/m RA Volume:   45.40 ml  26.25 ml/m LA Vol (A4C):   45.6 ml 26.37 ml/m LA Biplane Vol: 48.5 ml 28.04 ml/m  AORTIC VALVE AV Area (Vmax):    1.32 cm AV Area (Vmean):   1.30 cm AV Area (VTI):     1.31 cm AV Vmax:           137.00 cm/s AV Vmean:          97.200 cm/s AV VTI:            0.314 m AV Peak Grad:      7.5 mmHg AV Mean Grad:      4.0 mmHg LVOT Vmax:         71.00 cm/s LVOT Vmean:        49.600 cm/s LVOT VTI:          0.162 m LVOT/AV VTI ratio: 0.52  AORTA Ao Root diam: 3.50 cm Ao Asc diam:  2.90 cm MITRAL VALVE                TRICUSPID VALVE MV Area (PHT): 3.21 cm     TR Peak grad:   29.6 mmHg MV Peak grad:  8.0 mmHg     TR Vmax:        272.00 cm/s MV Mean grad:  2.0 mmHg MV Vmax:       1.41 m/s     SHUNTS MV Vmean:      67.8 cm/s    Systemic VTI:  0.16 m MV Decel Time: 236 msec     Systemic Diam: 1.80 cm MV E velocity: 144.00 cm/s MV A velocity: 109.00 cm/s MV E/A ratio:  1.32 Kirk Ruths MD Electronically signed by Kirk Ruths MD Signature Date/Time: 07/11/2019/4:53:46 PM    Final     Marzetta Board, MD, PhD [facility]  Between 7 am - 7 pm I am available, please contact me via Amion or Securechat  Between 7 pm - 7 am I am not available, please contact night coverage MD/APP via Amion

## 2019-07-12 NOTE — Progress Notes (Signed)
ANTICOAGULATION CONSULT NOTE - Follow Up Consult  Pharmacy Consult for Heparin > Eliquis Indication: DVT and suspected PE  Allergies  Allergen Reactions  . Bee Venom     Throat swelling  . Mushroom Extract Complex Anaphylaxis  . Penicillins Anaphylaxis, Hives and Swelling    Has patient had a PCN reaction causing immediate rash, facial/tongue/throat swelling, SOB or lightheadedness with hypotension: Yes Has patient had a PCN reaction causing severe rash involving mucus membranes or skin necrosis: Yes Has patient had a PCN reaction that required hospitalization Yes Has patient had a PCN reaction occurring within the last 10 years: No If all of the above answers are "NO", then may proceed with Cephalosporin use.   . Codeine Nausea And Vomiting  . Sulfa Antibiotics Nausea And Vomiting  . Iohexol   . Erythromycin Base Rash    Patient Measurements: Height: 5\' 2"  (157.5 cm) Weight: 71.7 kg (158 lb) IBW/kg (Calculated) : 50.1 Heparin Dosing Weight: 61.2 kg  Vital Signs: Temp: 97.5 F (36.4 C) (05/03 0755) Temp Source: Oral (05/03 0755) BP: 139/66 (05/03 0755) Pulse Rate: 59 (05/03 0755)  Labs: Recent Labs     0000 07/10/19 0247 07/10/19 1027 07/10/19 1214 07/10/19 2327 07/11/19 0029 07/11/19 1608 07/12/19 0302  HGB   < > 10.4*  --   --   --  10.9*  --  11.3*  HCT  --  32.1*  --   --   --  33.1*  --  34.6*  PLT  --  159  --   --   --  192  --  174  HEPARINUNFRC  --   --   --   --    < > 0.33 0.71* 0.46  CREATININE  --  1.42*  --   --   --  1.32*  --  1.54*  TROPONINIHS  --   --  32* 25*  --   --   --   --    < > = values in this interval not displayed.    Estimated Creatinine Clearance: 28.8 mL/min (A) (by C-G formula based on SCr of 1.54 mg/dL (H)).  Assessment:  74 y.o. female presenting with acute cholecystitis now s/p cholecystectomy on 4/28. Developed new onset dyspnea and chest pain 4/30 and found to have elevated d-dimer. Pharmacy has been consulted for IV  heparin dosing for suspicion for pulmonary embolism. Contrast allergy, no CTA.  LE dopplers noted acute left DVT on 5/1.     Heparin level therapeutic (0.46) on 1000 units/hr this am.  CBC stable.  To transition to Eliquis.  Aflutter > amiodarone drip begun 5/1 > NSR.  Goal of Therapy:  Heparin level 0.3-0.7 units/ml appropriate Eliquis dose for indication Monitor platelets by anticoagulation protocol: Yes   Plan:   Eliquis 10 mg PO BID x 1 week, then 5 mg PO BID.  IV heparin stopped when giving first Eliquis dose.  Monitor for bleeding.    Arty Baumgartner, [place] Phone: [phone] 07/12/2019,10:01 AM

## 2019-07-12 NOTE — Care Management (Signed)
Per Reather Converse W/[facility] Help Desk Ph# [phone]  Co-pay amount for Eliquis 5 mg. Bid,30 day supply @ retail pharmacy $47.00. Co-pay amount for Eliquis 13m. Bid for a 90 day supply mail order pharmacy $141.00.  No PA required Deductible not met Tier 3 Retail pharmacy [facility]  Mail order ([facility])

## 2019-07-13 LAB — COMPREHENSIVE METABOLIC PANEL
ALT: 18 U/L (ref 0–44)
AST: 22 U/L (ref 15–41)
Albumin: 2.4 g/dL — ABNORMAL LOW (ref 3.5–5.0)
Alkaline Phosphatase: 56 U/L (ref 38–126)
Anion gap: 11 (ref 5–15)
BUN: 8 mg/dL (ref 8–23)
CO2: 21 mmol/L — ABNORMAL LOW (ref 22–32)
Calcium: 8.6 mg/dL — ABNORMAL LOW (ref 8.9–10.3)
Chloride: 107 mmol/L (ref 98–111)
Creatinine, Ser: 1.24 mg/dL — ABNORMAL HIGH (ref 0.44–1.00)
GFR calc Af Amer: 49 mL/min — ABNORMAL LOW (ref >60)
GFR calc non Af Amer: 42 mL/min — ABNORMAL LOW (ref >60)
Glucose, Bld: 113 mg/dL — ABNORMAL HIGH (ref 70–99)
Potassium: 4 mmol/L (ref 3.5–5.1)
Sodium: 139 mmol/L (ref 135–145)
Total Bilirubin: 0.6 mg/dL (ref 0.3–1.2)
Total Protein: 4.8 g/dL — ABNORMAL LOW (ref 6.5–8.1)

## 2019-07-13 LAB — GLUCOSE, CAPILLARY
Glucose-Capillary: 128 mg/dL — ABNORMAL HIGH (ref 70–99)
Glucose-Capillary: 115 mg/dL — ABNORMAL HIGH (ref 70–99)
Glucose-Capillary: 151 mg/dL — ABNORMAL HIGH (ref 70–99)
Glucose-Capillary: 151 mg/dL — ABNORMAL HIGH (ref 70–99)

## 2019-07-13 LAB — CBC
HCT: 34.9 % — ABNORMAL LOW (ref 36.0–46.0)
Hemoglobin: 11.2 g/dL — ABNORMAL LOW (ref 12.0–15.0)
MCH: 29.8 pg (ref 26.0–34.0)
MCHC: 32.1 g/dL (ref 30.0–36.0)
MCV: 92.8 fL (ref 80.0–100.0)
Platelets: 201 10*3/uL (ref 150–400)
RBC: 3.76 MIL/uL — ABNORMAL LOW (ref 3.87–5.11)
RDW: 13.2 % (ref 11.5–15.5)
WBC: 6.1 10*3/uL (ref 4.0–10.5)
nRBC: 0 % (ref 0.0–0.2)

## 2019-07-13 MED ORDER — APIXABAN 5 MG PO TABS
10.0000 mg | ORAL_TABLET | Freq: Two times a day (BID) | ORAL | Status: AC
  Administered 2019-07-13 – 2019-07-16 (×7): 10 mg via ORAL
  Filled 2019-07-13 (×3): qty 2
  Filled 2019-07-13: qty 4
  Filled 2019-07-13 (×3): qty 2
  Filled 2019-07-13: qty 4

## 2019-07-13 MED ORDER — APIXABAN 5 MG PO TABS
5.0000 mg | ORAL_TABLET | Freq: Two times a day (BID) | ORAL | Status: DC
  Administered 2019-07-17 – 2019-07-23 (×13): 5 mg via ORAL
  Filled 2019-07-13 (×13): qty 1

## 2019-07-13 MED ORDER — CLOPIDOGREL BISULFATE 75 MG PO TABS: 75.0000 mg | ORAL_TABLET | Freq: Every day | ORAL | 1 refills | Status: DC

## 2019-07-13 NOTE — Evaluation (Signed)
Physical Therapy Evaluation Patient Details Name: Maria Fox MRN: [id number] DOB: 1945-06-09 Today's Date: 07/13/2019   History of Present Illness  Pt is 74 yo female with HTN, HLD, DM, history of CVA, CAD status post CABG, came into the hospital with abdominal pain, mainly on the right side which started about 1 to 2 weeks ago.  She was diagnosed with acute cholecystitis and is s/p lap chole on 4/28. Postoperative course was complicated by development of a flutter, chest pain, respiratory distress and acute DVT /presumed PE  Clinical Impression  Pt evaluated again. On original eval amb well but now with decline in mobility after complications including DVT/PE. Will continue to follow acutely but expect will return to baseline and not need PT after DC if continues to progress.     Follow Up Recommendations No PT follow up    Equipment Recommendations  None recommended by PT    Recommendations for Other Services       Precautions / Restrictions Precautions Precautions: None      Mobility  Bed Mobility Overal bed mobility: Needs Assistance Bed Mobility: Supine to Sit;Sit to Supine     Supine to sit: Min assist;HOB elevated Sit to supine: Supervision   General bed mobility comments: Assist to elevate trunk into sitting  Transfers Overall transfer level: Needs assistance Equipment used: 1 person hand held assist;4-wheeled walker Transfers: Sit to/from Stand Sit to Stand: Min assist         General transfer comment: Assist for balance  Ambulation/Gait Ambulation/Gait assistance: Min assist;Min guard Gait Distance (Feet): 300 Feet Assistive device: 4-wheeled walker;1 person hand held assist Gait Pattern/deviations: Step-through pattern;Decreased stride length Gait velocity: decr Gait velocity interpretation: 1.31 - 2.62 ft/sec, indicative of limited community ambulator General Gait Details: Assist for balance and support. Min guard with rollator and min assist with  hand held  Science writer    Modified Rankin (Stroke Patients Only)       Balance Overall balance assessment: Needs assistance Sitting-balance support: No upper extremity supported;Feet supported Sitting balance-Leahy Scale: Good     Standing balance support: Single extremity supported Standing balance-Leahy Scale: Poor Standing balance comment: UE support                             Pertinent Vitals/Pain      Home Living Family/patient expects to be discharged to:: Private residence Living Arrangements: Spouse/significant other(fiance [name]) Available Help at Discharge: Family;Available 24 hours/day Type of Home: House Home Access: Level entry     Home Layout: One level Home Equipment: Walker - 2 wheels Additional Comments: may have shower seat    Prior Function Level of Independence: Needs assistance   Gait / Transfers Assistance Needed: Reports can do community ambulation but with hip pain ; does not use AD  ADL's / Homemaking Assistance Needed: Reports independent with ADLs and IADLs        Hand Dominance   Dominant Hand: Right    Extremity/Trunk Assessment   Upper Extremity Assessment Upper Extremity Assessment: Generalized weakness    Lower Extremity Assessment Lower Extremity Assessment: Generalized weakness    Cervical / Trunk Assessment Cervical / Trunk Assessment: Normal  Communication   Communication: No difficulties  Cognition Arousal/Alertness: Awake/alert Behavior During Therapy: WFL for tasks assessed/performed Overall Cognitive Status: Within Functional Limits for tasks assessed  General Comments      Exercises     Assessment/Plan    PT Assessment Patient needs continued PT services  PT Problem List Decreased strength;Decreased activity tolerance;Decreased balance;Decreased mobility       PT Treatment Interventions DME  instruction;Gait training;Functional mobility training;Therapeutic activities;Therapeutic exercise;Balance training;Patient/family education    PT Goals (Current goals can be found in the Care Plan section)  Acute Rehab PT Goals Patient Stated Goal: return home PT Goal Formulation: With patient Time For Goal Achievement: 07/20/19 Potential to Achieve Goals: Good    Frequency Min 3X/week   Barriers to discharge        Co-evaluation               AM-PAC PT "6 Clicks" Mobility  Outcome Measure Help needed turning from your back to your side while in a flat bed without using bedrails?: None Help needed moving from lying on your back to sitting on the side of a flat bed without using bedrails?: A Little Help needed moving to and from a bed to a chair (including a wheelchair)?: A Little Help needed standing up from a chair using your arms (e.g., wheelchair or bedside chair)?: A Little Help needed to walk in hospital room?: A Little Help needed climbing 3-5 steps with a railing? : A Little 6 Click Score: 19    End of Session   Activity Tolerance: Patient tolerated treatment well Patient left: in bed;with call bell/phone within reach   PT Visit Diagnosis: Unsteadiness on feet (R26.81);Muscle weakness (generalized) (M62.81)    Time: [id number] PT Time Calculation (min) (ACUTE ONLY): 20 min   Charges:   PT Evaluation $PT Eval Low Complexity: [place] [facility] Pager [phone] Office [facility] [place] 07/13/2019, 11:43 AM

## 2019-07-13 NOTE — Care Management Important Message (Signed)
Important Message  Patient Details  Name: Maria Fox MRN: [id number] Date of Birth: August 30, 1945   Medicare Important Message Given:  Yes     Shelda Altes 07/13/2019, 9:58 AM

## 2019-07-13 NOTE — Progress Notes (Signed)
PROGRESS NOTE  Maria Fox [name] DOB: 1945-03-20 DOA: 07/06/2019 PCP: [name], MD   LOS: 6 days   Brief Narrative / Interim history: 74 year old female with HTN, HLD, DM, history of CVA, CAD status post CABG, came into the hospital with abdominal pain, mainly on the right side which started about 1 to 2 weeks ago.  This is associated with nausea and vomiting.  In the ED she was diagnosed with acute cholecystitis and general surgery took her to the OR shortly after admission on 4/28.  Postoperative course was complicated by development of a flutter, chest pain, respiratory distress and acute DVT /presumed PE  Subjective / 24h Interval events: She is feeling a little bit better, no chest pain, no shortness of breath on oxygen but has not been doing much.  No nausea or vomiting, no abdominal pain  Assessment & Plan: Principal Problem Acute cholecystitis-General surgery consulted, she is status post laparoscopic cholecystectomy on 4/28.  Appreciate surgery follow-up, seems to be recovering well, encourage diet as tolerated  Active Problems Acute DVT, presumed pulmonary embolism with acute hypoxic respiratory failure-patient with chest pain and new onset a flutter on 5/1.  D-dimer was elevated and she was empirically started on heparin out of concern for PE.  Lower extremity Dopplers did show an acute DVT.  CT angiogram was initially ordered however she does have an allergy to IV contrast and borderline renal function, and given acute DVT I would presume she also has a PE and CT scan was canceled.  2D echo did not show any RV strain, normal LVEF -Tolerated heparin well, hemoglobin is stable and she was started on Eliquis on 5/3  A flutter/chest pain-cardiology consult appreciated, she was started on amiodarone and now she is in sinus rhythm.  Continue beta-blockers, cardiology recommending IV amiodarone up until discharge per their note.  They signed off and set up outpatient  follow-up appointment  Acute hypoxic respiratory failure-Still hypoxic this morning but improved from 4 L yesterday to 2 L today.  Attempt to wean off to room air  Acute kidney injury on chronic kidney disease stage IIIa-creatinine overall stable, did increase slightly after Lasix when she was in respiratory distress on 5/2, but now gradually improving and is within her baseline at 1.24 this morning.  Essential hypertension-continue metoprolol  Type 2 diabetes mellitus-cover with sliding scale  CBG (last 3)  Recent Labs    07/12/19 1626 07/12/19 2108 07/13/19 0622  GLUCAP 192* 111* 128*   Hyperlipidemia-continue atorvastatin  Coronary artery disease, history of chronic diastolic CHF, history of CABG-no chest pain this morning.  Troponins flat   Scheduled Meds: . apixaban  10 mg Oral BID   Followed by  . [START ON 07/17/2019] apixaban  5 mg Oral BID  . atorvastatin  40 mg Oral Daily  . docusate sodium  100 mg Oral BID  . gabapentin  100 mg Oral TID  . insulin aspart  0-15 Units Subcutaneous TID WC  . insulin aspart  0-5 Units Subcutaneous QHS  . insulin glargine  5 Units Subcutaneous Daily  . isosorbide mononitrate  15 mg Oral Daily  . latanoprost  1 drop Both Eyes QHS  . metoprolol tartrate  12.5 mg Oral BID   Continuous Infusions: . amiodarone 30 mg/hr (07/13/19 0306)   PRN Meds:.acetaminophen **OR** acetaminophen, albuterol, bisacodyl, hydrALAZINE, HYDROcodone-acetaminophen, metoprolol tartrate, morphine injection, ondansetron (ZOFRAN) IV, polyethylene glycol, traZODone  DVT prophylaxis: SCD Code Status: Full code Family Communication: no family at bedside, discussed with  the patient  Status is: Inpatient  Remains inpatient appropriate because: She remains borderline hypoxic this morning and still has not had good p.o. intake.  Attempt to wean off to room air and encourage p.o. intake, anticipate discharge 24-48 hours  Dispo: The patient is from: Home               Anticipated d/c is to: Home              Anticipated d/c date is: 2 days              Patient currently is not medically stable to d/c.  Consultants:  General surgery Cardiology  Procedures:  None   Microbiology  None   Antimicrobials: None     Objective: Vitals:   07/12/19 2333 07/13/19 0516 07/13/19 0729 07/13/19 1100  BP: (!) 105/55 (!) 147/77 (!) 114/51 (!) 148/58  Pulse: (!) 57 67 60 61  Resp: 15 17 14 17   Temp: 97.8 F (36.6 C) 97.6 F (36.4 C) (!) 97.4 F (36.3 C) 97.8 F (36.6 C)  TempSrc: Oral Oral Oral Oral  SpO2: 96% 95% 94% 97%  Weight:      Height:        Intake/Output Summary (Last 24 hours) at 07/13/2019 1109 Last data filed at 07/13/2019 0800 Gross per 24 hour  Intake 664.31 ml  Output 600 ml  Net 64.31 ml   Filed Weights   07/06/19 1842 07/10/19 1600  Weight: 61.2 kg 71.7 kg    Examination:  Constitutional: No distress, in bed Eyes: No scleral icterus ENMT: Moist mucous membranes Respiratory: Diminished at the bases but overall clear, no wheezing, no crackles Cardiovascular: Regular rate and rhythm, no murmurs appreciated, no peripheral edema Abdomen: Soft, mild tenderness present but no guarding or rebound, no distention appreciated Musculoskeletal: no clubbing / cyanosis.  Skin: No rashes seen Neurologic: No focal deficits  Data Reviewed: I have independently reviewed following labs and imaging studies   CBC: Recent Labs  Lab 07/09/19 0148 07/10/19 0247 07/11/19 0029 07/12/19 0302 07/13/19 0341  WBC 9.6 7.1 7.7 6.2 6.1  HGB 10.8* 10.4* 10.9* 11.3* 11.2*  HCT 33.2* 32.1* 33.1* 34.6* 34.9*  MCV 91.2 92.0 90.4 92.3 92.8  PLT 186 159 192 174 [id number]   Basic Metabolic Panel: Recent Labs  Lab 07/09/19 0148 07/10/19 0247 07/11/19 0029 07/12/19 0302 07/13/19 0341  NA 135 138 134* 142 139  K 4.1 3.9 4.0 4.0 4.0  CL 107 113* 104 111 107  CO2 23 20* 23 21* 21*  GLUCOSE 279* 217* 319* 141* 113*  BUN 20 14 12 9 8   CREATININE 1.94*  1.42* 1.32* 1.54* 1.24*  CALCIUM 8.1* 7.4* 8.3* 8.7* 8.6*  MG  --   --   --  1.7  --    Liver Function Tests: Recent Labs  Lab 07/09/19 0148 07/10/19 0247 07/11/19 0029 07/12/19 0302 07/13/19 0341  AST 27 17 18 16 22   ALT 23 19 20 17 18   ALKPHOS 59 52 66 65 56  BILITOT 0.6 0.3 0.4 0.2* 0.6  PROT 5.1* 4.5* 5.1* 5.1* 4.8*  ALBUMIN 2.5* 2.0* 2.4* 2.5* 2.4*   Coagulation Profile: No results for input(s): INR, PROTIME in the last 168 hours. HbA1C: No results for input(s): HGBA1C in the last 72 hours. CBG: Recent Labs  Lab 07/12/19 0632 07/12/19 1122 07/12/19 1626 07/12/19 2108 07/13/19 0622  GLUCAP 137* 182* 192* 111* 128*    Recent Results (from the past 240 hour(s))  Culture, blood (Routine X 2) w Reflex to ID Panel     Status: None   Collection Time: 07/07/19  2:50 AM   Specimen: BLOOD  Result Value Ref Range Status   Specimen Description BLOOD LEFT ARM  Final   Special Requests   Final    BOTTLES DRAWN AEROBIC AND ANAEROBIC Blood Culture results may not be optimal due to an excessive volume of blood received in culture bottles   Culture   Final    NO GROWTH 5 DAYS Performed at [facility], [place] [address], [place], [place] [address]    Report Status 07/12/2019 FINAL  Final  Culture, blood (Routine X 2) w Reflex to ID Panel     Status: None   Collection Time: 07/07/19  3:10 AM   Specimen: BLOOD  Result Value Ref Range Status   Specimen Description BLOOD RIGHT FOREARM  Final   Special Requests   Final    BOTTLES DRAWN AEROBIC ONLY Blood Culture results may not be optimal due to an inadequate volume of blood received in culture bottles   Culture   Final    NO GROWTH 5 DAYS Performed at [facility], [place] [address], [place], [place] [address]    Report Status 07/12/2019 FINAL  Final  Respiratory Panel by RT PCR (Flu A&B, Covid) - Nasopharyngeal Swab     Status: None   Collection Time: 07/07/19  6:35 AM   Specimen: Nasopharyngeal Swab  Result Value  Ref Range Status   SARS Coronavirus 2 by RT PCR NEGATIVE NEGATIVE Final    Comment: (NOTE) SARS-CoV-2 target nucleic acids are NOT DETECTED. The SARS-CoV-2 RNA is generally detectable in upper respiratoy specimens during the acute phase of infection. The lowest concentration of SARS-CoV-2 viral copies this assay can detect is 131 copies/mL. A negative result does not preclude SARS-Cov-2 infection and should not be used as the sole basis for treatment or other patient management decisions. A negative result may occur with  improper specimen collection/handling, submission of specimen other than nasopharyngeal swab, presence of viral mutation(s) within the areas targeted by this assay, and inadequate number of viral copies (<131 copies/mL). A negative result must be combined with clinical observations, patient history, and epidemiological information. The expected result is Negative. Fact Sheet for Patients:  PinkCheek.be Fact Sheet for Healthcare Providers:  GravelBags.it This test is not yet ap proved or cleared by the [place] FDA and  has been authorized for detection and/or diagnosis of SARS-CoV-2 by FDA under an Emergency Use Authorization (EUA). This EUA will remain  in effect (meaning this test can be used) for the duration of the COVID-19 declaration under Section 564(b)(1) of the Act, 21 U.S.C. section 360bbb-3(b)(1), unless the authorization is terminated or revoked sooner.    Influenza A by PCR NEGATIVE NEGATIVE Final   Influenza B by PCR NEGATIVE NEGATIVE Final    Comment: (NOTE) The Xpert Xpress SARS-CoV-2/FLU/RSV assay is intended as an aid in  the diagnosis of influenza from Nasopharyngeal swab specimens and  should not be used as a sole basis for treatment. Nasal washings and  aspirates are unacceptable for Xpert Xpress SARS-CoV-2/FLU/RSV  testing. Fact Sheet for  Patients: PinkCheek.be Fact Sheet for Healthcare Providers: GravelBags.it This test is not yet approved or cleared by the [place] FDA and  has been authorized for detection and/or diagnosis of SARS-CoV-2 by  FDA under an Emergency Use Authorization (EUA). This EUA will remain  in effect (meaning this test  can be used) for the duration of the  Covid-19 declaration under Section 564(b)(1) of the Act, 21  U.S.C. section 360bbb-3(b)(1), unless the authorization is  terminated or revoked. Performed at [facility], [place] [address], [place], [place] [address]   Culture, Urine     Status: Abnormal   Collection Time: 07/09/19 12:12 PM   Specimen: Urine, Random  Result Value Ref Range Status   Specimen Description URINE, RANDOM  Final   Special Requests NONE  Final   Culture (A)  Final    <10,000 COLONIES/mL INSIGNIFICANT GROWTH Performed at [place] [address], [place], [place] [address]    Report Status 07/10/2019 FINAL  Final     Radiology Studies: No results found.  Marzetta Board, MD, PhD [facility]  Between 7 am - 7 pm I am available, please contact me via Amion or Securechat  Between 7 pm - 7 am I am not available, please contact night coverage MD/APP via Amion

## 2019-07-14 DIAGNOSIS — E1122 Type 2 diabetes mellitus with diabetic chronic kidney disease: Secondary | ICD-10-CM

## 2019-07-14 DIAGNOSIS — N183 Chronic kidney disease, stage 3 unspecified: Secondary | ICD-10-CM

## 2019-07-14 LAB — BASIC METABOLIC PANEL
Anion gap: 10 (ref 5–15)
BUN: 8 mg/dL (ref 8–23)
CO2: 23 mmol/L (ref 22–32)
Calcium: 8.8 mg/dL — ABNORMAL LOW (ref 8.9–10.3)
Chloride: 107 mmol/L (ref 98–111)
Creatinine, Ser: 1.17 mg/dL — ABNORMAL HIGH (ref 0.44–1.00)
GFR calc Af Amer: 52 mL/min — ABNORMAL LOW (ref >60)
GFR calc non Af Amer: 45 mL/min — ABNORMAL LOW (ref >60)
Glucose, Bld: 147 mg/dL — ABNORMAL HIGH (ref 70–99)
Potassium: 4 mmol/L (ref 3.5–5.1)
Sodium: 140 mmol/L (ref 135–145)

## 2019-07-14 LAB — CBC
HCT: 35.9 % — ABNORMAL LOW (ref 36.0–46.0)
Hemoglobin: 11.8 g/dL — ABNORMAL LOW (ref 12.0–15.0)
MCH: 29.9 pg (ref 26.0–34.0)
MCHC: 32.9 g/dL (ref 30.0–36.0)
MCV: 91.1 fL (ref 80.0–100.0)
Platelets: 203 10*3/uL (ref 150–400)
RBC: 3.94 MIL/uL (ref 3.87–5.11)
RDW: 13.2 % (ref 11.5–15.5)
WBC: 5.6 10*3/uL (ref 4.0–10.5)
nRBC: 0 % (ref 0.0–0.2)

## 2019-07-14 LAB — GLUCOSE, CAPILLARY
Glucose-Capillary: 163 mg/dL — ABNORMAL HIGH (ref 70–99)
Glucose-Capillary: 190 mg/dL — ABNORMAL HIGH (ref 70–99)
Glucose-Capillary: 199 mg/dL — ABNORMAL HIGH (ref 70–99)
Glucose-Capillary: 142 mg/dL — ABNORMAL HIGH (ref 70–99)
Glucose-Capillary: 89 mg/dL (ref 70–99)

## 2019-07-14 LAB — MAGNESIUM: Magnesium: 1.7 mg/dL (ref 1.7–2.4)

## 2019-07-14 MED ORDER — ONDANSETRON 4 MG PO TBDP
4.0000 mg | ORAL_TABLET | Freq: Four times a day (QID) | ORAL | Status: DC | PRN
  Administered 2019-07-14 – 2019-07-15 (×3): 4 mg via ORAL
  Filled 2019-07-14 (×4): qty 1

## 2019-07-14 MED ORDER — ADULT MULTIVITAMIN W/MINERALS CH
1.0000 | ORAL_TABLET | Freq: Every day | ORAL | Status: DC
  Administered 2019-07-14 – 2019-07-22 (×9): 1 via ORAL
  Filled 2019-07-14 (×10): qty 1

## 2019-07-14 MED ORDER — POLYETHYLENE GLYCOL 3350 17 G PO PACK
17.0000 g | PACK | Freq: Every day | ORAL | Status: DC
  Administered 2019-07-14 – 2019-07-18 (×2): 17 g via ORAL
  Filled 2019-07-14 (×7): qty 1

## 2019-07-14 MED ORDER — TRAMADOL HCL 50 MG PO TABS
50.0000 mg | ORAL_TABLET | Freq: Four times a day (QID) | ORAL | Status: DC | PRN
  Administered 2019-07-14 – 2019-07-17 (×8): 50 mg via ORAL
  Filled 2019-07-14 (×8): qty 1

## 2019-07-14 MED ORDER — ENSURE ENLIVE PO LIQD
237.0000 mL | Freq: Three times a day (TID) | ORAL | Status: DC
  Administered 2019-07-14 – 2019-07-21 (×9): 237 mL via ORAL

## 2019-07-14 MED ORDER — FUROSEMIDE 10 MG/ML IJ SOLN
60.0000 mg | Freq: Once | INTRAMUSCULAR | Status: AC
  Administered 2019-07-14: 60 mg via INTRAVENOUS
  Filled 2019-07-14: qty 6

## 2019-07-14 NOTE — Progress Notes (Signed)
Patient ID: Maria Fox, female   DOB: 10/31/1945, 74 y.o.   MRN: [id number]  PROGRESS NOTE    Maria Fox  [id number] DOB: [birth year] DOA: 07/06/2019 PCP: [name], MD   Brief Narrative:  74 year old female with HTN, HLD, DM, history of CVA, CAD status post CABG, came into the hospital with abdominal pain, mainly on the right side which started about 1 to 2 weeks ago.  She was diagnosed with acute cholecystitis and started on IV antibiotics.  She underwent lap chole on 06/29/2019.  Postsurgical course was complicated by atrial flutter, chest pain, respite distress and acute DVT/presumed PE.  She was treated with heparin and was subsequently switched to oral Eliquis.  Cardiology was also consulted and patient was started on IV amiodarone.   Assessment & Plan:   Acute cholecystitis -status post laparoscopic cholecystectomy on 07/07/19.  Appreciate surgery follow-up, seems to be recovering well -Still has intermittent nausea with poor oral intake.  DC IV amiodarone because of the same.  Acute DVT, presumed pulmonary embolism with acute hypoxic respiratory failure -patient with chest pain and new onset a flutter on 07/10/19.  D-dimer was elevated and she was empirically started on heparin out of concern for PE.  Lower extremity Dopplers did show an acute DVT.  CT angiogram was initially ordered however she does have an allergy to IV contrast and borderline renal function, and given acute DVT I would presume she also has a PE and CT scan was canceled.  2D echo did not show any RV strain, normal LVEF -Tolerated heparin well, hemoglobin is stable and she was started on Eliquis on 07/12/19  A flutter/chest pain -cardiology consult appreciated, she was started on amiodarone and now she is in sinus rhythm.  Continue beta-blockers, cardiology recommending IV amiodarone up until discharge per their note.  They signed off and set up outpatient follow-up appointment -Because of intermittent  nausea/vomiting, I will discontinue IV amiodarone and monitor heart rate.  Acute hypoxic respiratory failure -Still on 2 L oxygen. Attempt to wean off to room air.  Incentive spirometry.  Will give IV Lasix 1 dose today.  Acute kidney injury on chronic kidney disease stage IIIa -creatinine overall stable, did increase slightly after Lasix when she was in respiratory distress on 5/2, but now gradually improving and is within her baseline at 1.17 this morning. -Monitor.  Will give 60 mg IV Lasix 1 dose today.  Essential hypertension-continue metoprolol  Type 2 diabetes mellitus -CBGs with with sliding scale.  Continue Lantus  Hyperlipidemia-continue atorvastatin  Coronary artery disease, history of chronic diastolic CHF, history of CABG -Currently stable. Troponins flat     DVT prophylaxis: Eliquis Code Status: Full Family Communication: Patient at bedside Disposition Plan: Status is: Inpatient  Remains inpatient appropriate because: Still intermittently nauseous with vomiting and requiring supplemental oxygen.  Currently still on IV amiodarone drip.  DC amiodarone drip and attempt to wean off to room air; anticipated discharge in 1 to 2 days.   Dispo: The patient is from: Home              Anticipated d/c is to: Home              Anticipated d/c date is: 1 day              Patient currently is not medically stable to d/c.    Consultants: General surgery/cardiology  Procedures: Laparoscopic cholecystectomy on 07/07/2019  Antimicrobials:  Anti-infectives (From admission, onward)   Start  Dose/Rate Route Frequency Ordered Stop   07/08/19 0500  cefTRIAXone (ROCEPHIN) 2 g in sodium chloride 0.9 % 100 mL IVPB  Status:  Discontinued     2 g 200 mL/hr over 30 Minutes Intravenous Every 24 hours 07/07/19 0839 07/07/19 1802   07/07/19 0245  cefTRIAXone (ROCEPHIN) 2 g in sodium chloride 0.9 % 100 mL IVPB     2 g 200 mL/hr over 30 Minutes Intravenous  Once 07/07/19 0231  07/07/19 0451       Subjective: Patient seen and examined at bedside.  She does not feel well and still complains of intermittent nausea and some vomiting.  No worsening abdominal pain.  No overnight fevers.  Does not feel ready to go home today.  Oral intake is still very poor.  Objective: Vitals:   07/13/19 2319 07/14/19 0355 07/14/19 0829 07/14/19 1139  BP:  134/67 129/65 125/64  Pulse:  60 63 (!) 57  Resp:  14 19 20   Temp: 98 F (36.7 C) 98.1 F (36.7 C) 97.9 F (36.6 C) 98.6 F (37 C)  TempSrc: Oral Oral Oral Oral  SpO2:  93% 93% 93%  Weight:      Height:        Intake/Output Summary (Last 24 hours) at 07/14/2019 1312 Last data filed at 07/14/2019 0416 Gross per 24 hour  Intake 100 ml  Output 1250 ml  Net -1150 ml   Filed Weights   07/06/19 1842 07/10/19 1600  Weight: 61.2 kg 71.7 kg    Examination:  General exam: Appears calm and comfortable.  Looks chronically ill Respiratory system: Bilateral decreased breath sounds at bases with some scattered crackles Cardiovascular system: S1 & S2 heard, Rate controlled Gastrointestinal system: Abdomen is nondistended, soft and mildly tender. Normal bowel sounds heard. Extremities: No cyanosis, clubbing; trace lower extremity edema Central nervous system: Alert and oriented. No focal neurological deficits. Moving extremities Skin: No rashes, lesions or ulcers Psychiatry: Looks anxious    Data Reviewed: I have personally reviewed following labs and imaging studies  CBC: Recent Labs  Lab 07/10/19 0247 07/11/19 0029 07/12/19 0302 07/13/19 0341 07/14/19 0351  WBC 7.1 7.7 6.2 6.1 5.6  HGB 10.4* 10.9* 11.3* 11.2* 11.8*  HCT 32.1* 33.1* 34.6* 34.9* 35.9*  MCV 92.0 90.4 92.3 92.8 91.1  PLT 159 192 174 201 [id number]   Basic Metabolic Panel: Recent Labs  Lab 07/10/19 0247 07/11/19 0029 07/12/19 0302 07/13/19 0341 07/14/19 0351  NA 138 134* 142 139 140  K 3.9 4.0 4.0 4.0 4.0  CL 113* 104 111 107 107  CO2 20* 23 21*  21* 23  GLUCOSE 217* 319* 141* 113* 147*  BUN 14 12 9 8 8   CREATININE 1.42* 1.32* 1.54* 1.24* 1.17*  CALCIUM 7.4* 8.3* 8.7* 8.6* 8.8*  MG  --   --  1.7  --  1.7   GFR: Estimated Creatinine Clearance: 37.9 mL/min (A) (by C-G formula based on SCr of 1.17 mg/dL (H)). Liver Function Tests: Recent Labs  Lab 07/09/19 0148 07/10/19 0247 07/11/19 0029 07/12/19 0302 07/13/19 0341  AST 27 17 18 16 22   ALT 23 19 20 17 18   ALKPHOS 59 52 66 65 56  BILITOT 0.6 0.3 0.4 0.2* 0.6  PROT 5.1* 4.5* 5.1* 5.1* 4.8*  ALBUMIN 2.5* 2.0* 2.4* 2.5* 2.4*   No results for input(s): LIPASE, AMYLASE in the last 168 hours. No results for input(s): AMMONIA in the last 168 hours. Coagulation Profile: No results for input(s): INR, PROTIME in the last  168 hours. Cardiac Enzymes: No results for input(s): CKTOTAL, CKMB, CKMBINDEX, TROPONINI in the last 168 hours. BNP (last 3 results) No results for input(s): PROBNP in the last 8760 hours. HbA1C: No results for input(s): HGBA1C in the last 72 hours. CBG: Recent Labs  Lab 07/13/19 1613 07/13/19 2041 07/14/19 0616 07/14/19 0828 07/14/19 1138  GLUCAP 151* 151* 163* 190* 199*   Lipid Profile: No results for input(s): CHOL, HDL, LDLCALC, TRIG, CHOLHDL, LDLDIRECT in the last 72 hours. Thyroid Function Tests: No results for input(s): TSH, T4TOTAL, FREET4, T3FREE, THYROIDAB in the last 72 hours. Anemia Panel: No results for input(s): VITAMINB12, FOLATE, FERRITIN, TIBC, IRON, RETICCTPCT in the last 72 hours. Sepsis Labs: No results for input(s): PROCALCITON, LATICACIDVEN in the last 168 hours.  Recent Results (from the past 240 hour(s))  Culture, blood (Routine X 2) w Reflex to ID Panel     Status: None   Collection Time: 07/07/19  2:50 AM   Specimen: BLOOD  Result Value Ref Range Status   Specimen Description BLOOD LEFT ARM  Final   Special Requests   Final    BOTTLES DRAWN AEROBIC AND ANAEROBIC Blood Culture results may not be optimal due to an  excessive volume of blood received in culture bottles   Culture   Final    NO GROWTH 5 DAYS Performed at [facility], [place] [address] [place], [place] [address]    Report Status 07/12/2019 FINAL  Final  Culture, blood (Routine X 2) w Reflex to ID Panel     Status: None   Collection Time: 07/07/19  3:10 AM   Specimen: BLOOD  Result Value Ref Range Status   Specimen Description BLOOD RIGHT FOREARM  Final   Special Requests   Final    BOTTLES DRAWN AEROBIC ONLY Blood Culture results may not be optimal due to an inadequate volume of blood received in culture bottles   Culture   Final    NO GROWTH 5 DAYS Performed at [facility], [place] [address] [place], [place] [address]    Report Status 07/12/2019 FINAL  Final  Respiratory Panel by RT PCR (Flu A&B, Covid) - Nasopharyngeal Swab     Status: None   Collection Time: 07/07/19  6:35 AM   Specimen: Nasopharyngeal Swab  Result Value Ref Range Status   SARS Coronavirus 2 by RT PCR NEGATIVE NEGATIVE Final    Comment: (NOTE) SARS-CoV-2 target nucleic acids are NOT DETECTED. The SARS-CoV-2 RNA is generally detectable in upper respiratoy specimens during the acute phase of infection. The lowest concentration of SARS-CoV-2 viral copies this assay can detect is 131 copies/mL. A negative result does not preclude SARS-Cov-2 infection and should not be used as the sole basis for treatment or other patient management decisions. A negative result may occur with  improper specimen collection/handling, submission of specimen other than nasopharyngeal swab, presence of viral mutation(s) within the areas targeted by this assay, and inadequate number of viral copies (<131 copies/mL). A negative result must be combined with clinical observations, patient history, and epidemiological information. The expected result is Negative. Fact Sheet for Patients:  PinkCheek.be Fact Sheet for Healthcare Providers:    GravelBags.it This test is not yet ap proved or cleared by the [place] FDA and  has been authorized for detection and/or diagnosis of SARS-CoV-2 by FDA under an Emergency Use Authorization (EUA). This EUA will remain  in effect (meaning this test can be used) for the duration of the COVID-19 declaration under  Section 564(b)(1) of the Act, 21 U.S.C. section 360bbb-3(b)(1), unless the authorization is terminated or revoked sooner.    Influenza A by PCR NEGATIVE NEGATIVE Final   Influenza B by PCR NEGATIVE NEGATIVE Final    Comment: (NOTE) The Xpert Xpress SARS-CoV-2/FLU/RSV assay is intended as an aid in  the diagnosis of influenza from Nasopharyngeal swab specimens and  should not be used as a sole basis for treatment. Nasal washings and  aspirates are unacceptable for Xpert Xpress SARS-CoV-2/FLU/RSV  testing. Fact Sheet for Patients: PinkCheek.be Fact Sheet for Healthcare Providers: GravelBags.it This test is not yet approved or cleared by the [place] FDA and  has been authorized for detection and/or diagnosis of SARS-CoV-2 by  FDA under an Emergency Use Authorization (EUA). This EUA will remain  in effect (meaning this test can be used) for the duration of the  Covid-19 declaration under Section 564(b)(1) of the Act, 21  U.S.C. section 360bbb-3(b)(1), unless the authorization is  terminated or revoked. Performed at [facility], [place] [address] [place], [place] [address]   Culture, Urine     Status: Abnormal   Collection Time: 07/09/19 12:12 PM   Specimen: Urine, Random  Result Value Ref Range Status   Specimen Description URINE, RANDOM  Final   Special Requests NONE  Final   Culture (A)  Final    <10,000 COLONIES/mL INSIGNIFICANT GROWTH Performed at [place] [address] [place], [place] [address]    Report Status 07/10/2019 FINAL  Final          Radiology Studies: No results found.      Scheduled Meds: . apixaban  10 mg Oral BID   Followed by  . [START ON 07/17/2019] apixaban  5 mg Oral BID  . atorvastatin  40 mg Oral Daily  . docusate sodium  100 mg Oral BID  . feeding supplement (ENSURE ENLIVE)  237 mL Oral TID BM  . insulin aspart  0-15 Units Subcutaneous TID WC  . insulin aspart  0-5 Units Subcutaneous QHS  . insulin glargine  5 Units Subcutaneous Daily  . isosorbide mononitrate  15 mg Oral Daily  . latanoprost  1 drop Both Eyes QHS  . metoprolol tartrate  12.5 mg Oral BID  . multivitamin with minerals  1 tablet Oral Daily  . polyethylene glycol  17 g Oral Daily   Continuous Infusions: . amiodarone 30 mg/hr (07/13/19 1543)          Aline August, MD [facility] 07/14/2019, 1:12 PM

## 2019-07-14 NOTE — Progress Notes (Signed)
PT Cancellation Note  Patient Details Name: Maria Fox MRN: [id number] DOB: [birth year]   Cancelled Treatment:    Reason Eval/Treat Not Completed: Patient declined, no reason specified. Pt reports significant nausea and fatigue today that is preventing her from participating in therapy this afternoon. Pt provided with emesis bag and cool rag, PT will continue to follow and treat as time/schedule allows.   Karma Ganja, PT, DPT   Acute Rehabilitation Department Pager #: [phone]   Otho Bellows 07/14/2019, 3:21 PM

## 2019-07-14 NOTE — Progress Notes (Signed)
Patient ID: Maria Fox, female   DOB: 1945/12/11, 74 y.o.   MRN: [id number]    7 Days Post-Op  Subjective: Has some nausea that she says has been persistent over the last several days.  She is unsure if it is related to medications on an empty stomach.  She had been having diarrhea, but this has improved.  She is able to keep down soups and liquids fairly well.  Having a bad headache right now which may be contributing to her nausea as well.  ROS: See above, otherwise other systems negative  Objective: Vital signs in last 24 hours: Temp:  [97.6 F (36.4 C)-98.1 F (36.7 C)] 98.1 F (36.7 C) (05/05 0355) Pulse Rate:  [59-62] 60 (05/05 0355) Resp:  [14-20] 14 (05/05 0355) BP: (114-148)/(58-81) 134/67 (05/05 0355) SpO2:  [93 %-97 %] 93 % (05/05 0355) Last BM Date: 07/12/19  Intake/Output from previous day: 05/04 0701 - 05/05 0700 In: 320 [P.O.:320] Out: 1500 [Urine:1500] Intake/Output this shift: No intake/output data recorded.  PE: Abd: soft, essentially nontender, incisions c/d/i, +BS, ND  Lab Results:  Recent Labs    07/13/19 0341 07/14/19 0351  WBC 6.1 5.6  HGB 11.2* 11.8*  HCT 34.9* 35.9*  PLT 201 203   BMET Recent Labs    07/13/19 0341 07/14/19 0351  NA 139 140  K 4.0 4.0  CL 107 107  CO2 21* 23  GLUCOSE 113* 147*  BUN 8 8  CREATININE 1.24* 1.17*  CALCIUM 8.6* 8.8*   PT/INR No results for input(s): LABPROT, INR in the last 72 hours. CMP     Component Value Date/Time   NA 140 07/14/2019 0351   NA 140 12/11/2017 1027   K 4.0 07/14/2019 0351   CL 107 07/14/2019 0351   CO2 23 07/14/2019 0351   GLUCOSE 147 (H) 07/14/2019 0351   BUN 8 07/14/2019 0351   BUN 15 12/11/2017 1027   CREATININE 1.17 (H) 07/14/2019 0351   CALCIUM 8.8 (L) 07/14/2019 0351   PROT 4.8 (L) 07/13/2019 0341   PROT 6.1 10/17/2017 0927   ALBUMIN 2.4 (L) 07/13/2019 0341   ALBUMIN 4.0 10/17/2017 0927   AST 22 07/13/2019 0341   ALT 18 07/13/2019 0341   ALKPHOS 56 07/13/2019 0341     BILITOT 0.6 07/13/2019 0341   BILITOT 0.3 10/17/2017 0927   GFRNONAA 45 (L) 07/14/2019 0351   GFRAA 52 (L) 07/14/2019 0351   Lipase     Component Value Date/Time   LIPASE 29 07/06/2019 1906       Studies/Results: No results found.  Anti-infectives: Anti-infectives (From admission, onward)   Start     Dose/Rate Route Frequency Ordered Stop   07/08/19 0500  cefTRIAXone (ROCEPHIN) 2 g in sodium chloride 0.9 % 100 mL IVPB  Status:  Discontinued     2 g 200 mL/hr over 30 Minutes Intravenous Every 24 hours 07/07/19 0839 07/07/19 1802   07/07/19 0245  cefTRIAXone (ROCEPHIN) 2 g in sodium chloride 0.9 % 100 mL IVPB     2 g 200 mL/hr over 30 Minutes Intravenous  Once 07/07/19 0231 07/07/19 0451       Assessment/Plan Hypertension Hx CHF CAD with Hx CABG/PTCA stents- on Plavix@ home Hx CVA Type 2 diabetes Neuropathy CKD Hx insomnia Iron/B12 deficiencies AKI Atrial flutter  Acute DVT --per TRH--  Acute cholecystitis POD 7, s/p Lap Chole, 4/28, Dr. Ninfa Linden, acute cholecystitis -with some nausea, but not clear this is secondary to her surgery.  I changed up  some of her medications to help incase these were causing some nausea -encourage PO intake, add ensure/nutritional shakes -no abdominal pain or findings concerning nausea is related to post operative complication -surgically stable at this time.  FEN:HH/CM ID: RocephinperiopNo further abx indicated. DVT: SCDs,IV heparin, ok to transition to oral anticoagulation when indicated Follow-up:DOW   LOS: 7 days    Maria Fox , [facility] 07/14/2019, 8:01 AM Please see Amion for pager number during day hours 7:00am-4:30pm or 7:00am -11:30am on weekends

## 2019-07-14 NOTE — Progress Notes (Signed)
Initial Nutrition Assessment  DOCUMENTATION CODES:   Not applicable  INTERVENTION:    Ensure Enlive po TID, each supplement provides 350 kcal and 20 grams of protein  Provide MVI daily  NUTRITION DIAGNOSIS:   Increased nutrient needs related to post-op healing as evidenced by estimated needs.  GOAL:   Patient will meet greater than or equal to 90% of their needs  MONITOR:   PO intake, Supplement acceptance, Weight trends, Labs, I & O's, Skin  REASON FOR ASSESSMENT:   LOS    ASSESSMENT:   Patient with PMH significant for HTN, HLD, DM, CVA, autoimmune deficiency syndrome, and CAD s/p CABG. Presents this admission with acute cholecystitis.   4/28- s/p laparoscopic cholecystectomy   Pt denies loss in appetite PTA. States she has never been a "big eater." Typically consumes three meals daily that consist of B-muffin with coffee L-fries D-BBQ and vegetable. Does not use supplementation. Appetite slow to progress s/p surgery. Meal completions charted as 0-60% for her last eight meals. Discussed the importance of protein intake to promote post-op healing. Willing to try Ensure.   Endorses a UBW of 132 lb and denies weight loss. Records indicate pt has maintained her weight over the last year. Suspect fluid plays a role in recent weight gain.   I/O: +4,727 ml since admit  UOP: 1,500 ml x 24 hrs   Medications: colace, SS novolog, lantus, MVI with minerals, miralax Labs: CBG 115-199  Diet Order:   Diet Order            Diet heart healthy/carb modified Room service appropriate? Yes; Fluid consistency: Thin  Diet effective now              EDUCATION NEEDS:   Not appropriate for education at this time  Skin:  Skin Assessment: Skin Integrity Issues: Skin Integrity Issues:: Incisions Incisions: abdomen  Last BM:  5/3  Height:   Ht Readings from Last 1 Encounters:  07/10/19 5\' 2"  (1.575 m)    Weight:   Wt Readings from Last 1 Encounters:  07/10/19 71.7 kg     BMI:  Body mass index is 28.9 kg/m.  Estimated Nutritional Needs:   Kcal:  1750-1950 kcal  Protein:  90-105 grams  Fluid:  >/= 1.7 L/day   Mariana Single RD, LDN Clinical Nutrition Pager listed in [place]

## 2019-07-15 DIAGNOSIS — N179 Acute kidney failure, unspecified: Secondary | ICD-10-CM

## 2019-07-15 LAB — COMPREHENSIVE METABOLIC PANEL
ALT: 27 U/L (ref 0–44)
AST: 28 U/L (ref 15–41)
Albumin: 3.1 g/dL — ABNORMAL LOW (ref 3.5–5.0)
Alkaline Phosphatase: 75 U/L (ref 38–126)
Anion gap: 7 (ref 5–15)
BUN: 7 mg/dL — ABNORMAL LOW (ref 8–23)
CO2: 27 mmol/L (ref 22–32)
Calcium: 8.7 mg/dL — ABNORMAL LOW (ref 8.9–10.3)
Chloride: 102 mmol/L (ref 98–111)
Creatinine, Ser: 1.42 mg/dL — ABNORMAL HIGH (ref 0.44–1.00)
GFR calc Af Amer: 41 mL/min — ABNORMAL LOW (ref >60)
GFR calc non Af Amer: 36 mL/min — ABNORMAL LOW (ref >60)
Glucose, Bld: 123 mg/dL — ABNORMAL HIGH (ref 70–99)
Potassium: 3.9 mmol/L (ref 3.5–5.1)
Sodium: 136 mmol/L (ref 135–145)
Total Bilirubin: 0.5 mg/dL (ref 0.3–1.2)
Total Protein: 6 g/dL — ABNORMAL LOW (ref 6.5–8.1)

## 2019-07-15 LAB — CBC WITH DIFFERENTIAL/PLATELET
Abs Immature Granulocytes: 0.06 10*3/uL (ref 0.00–0.07)
Basophils Absolute: 0.1 10*3/uL (ref 0.0–0.1)
Basophils Relative: 1 %
Eosinophils Absolute: 0.3 10*3/uL (ref 0.0–0.5)
Eosinophils Relative: 5 %
HCT: 38.1 % (ref 36.0–46.0)
Hemoglobin: 12.8 g/dL (ref 12.0–15.0)
Immature Granulocytes: 1 %
Lymphocytes Relative: 28 %
Lymphs Abs: 1.6 10*3/uL (ref 0.7–4.0)
MCH: 30.3 pg (ref 26.0–34.0)
MCHC: 33.6 g/dL (ref 30.0–36.0)
MCV: 90.1 fL (ref 80.0–100.0)
Monocytes Absolute: 0.6 10*3/uL (ref 0.1–1.0)
Monocytes Relative: 9 %
Neutro Abs: 3.3 10*3/uL (ref 1.7–7.7)
Neutrophils Relative %: 56 %
Platelets: 228 10*3/uL (ref 150–400)
RBC: 4.23 MIL/uL (ref 3.87–5.11)
RDW: 13.2 % (ref 11.5–15.5)
WBC: 5.9 10*3/uL (ref 4.0–10.5)
nRBC: 0 % (ref 0.0–0.2)

## 2019-07-15 LAB — GLUCOSE, CAPILLARY
Glucose-Capillary: 100 mg/dL — ABNORMAL HIGH (ref 70–99)
Glucose-Capillary: 190 mg/dL — ABNORMAL HIGH (ref 70–99)
Glucose-Capillary: 171 mg/dL — ABNORMAL HIGH (ref 70–99)
Glucose-Capillary: 147 mg/dL — ABNORMAL HIGH (ref 70–99)

## 2019-07-15 LAB — MAGNESIUM: Magnesium: 1.8 mg/dL (ref 1.7–2.4)

## 2019-07-15 LAB — LIPASE, BLOOD: Lipase: 18 U/L (ref 11–51)

## 2019-07-15 MED ORDER — METOCLOPRAMIDE HCL 5 MG/ML IJ SOLN
10.0000 mg | Freq: Four times a day (QID) | INTRAMUSCULAR | Status: DC | PRN
  Administered 2019-07-16 (×2): 10 mg via INTRAVENOUS
  Filled 2019-07-15 (×2): qty 2

## 2019-07-15 MED ORDER — ONDANSETRON HCL 4 MG/2ML IJ SOLN
4.0000 mg | Freq: Four times a day (QID) | INTRAMUSCULAR | Status: DC
  Administered 2019-07-15 – 2019-07-23 (×27): 4 mg via INTRAVENOUS
  Filled 2019-07-15 (×27): qty 2

## 2019-07-15 MED ORDER — SENNOSIDES-DOCUSATE SODIUM 8.6-50 MG PO TABS
1.0000 | ORAL_TABLET | Freq: Two times a day (BID) | ORAL | Status: DC
  Administered 2019-07-19 – 2019-07-23 (×2): 1 via ORAL
  Filled 2019-07-15 (×11): qty 1

## 2019-07-15 MED ORDER — ALUM & MAG HYDROXIDE-SIMETH 200-200-20 MG/5ML PO SUSP: 15.0000 mL | ORAL | Status: DC | PRN

## 2019-07-15 MED ORDER — ASPIRIN-ACETAMINOPHEN-CAFFEINE 250-250-65 MG PO TABS
1.0000 | ORAL_TABLET | Freq: Four times a day (QID) | ORAL | Status: DC | PRN
  Administered 2019-07-15 – 2019-07-16 (×3): 1 via ORAL
  Filled 2019-07-15 (×5): qty 1

## 2019-07-15 MED ORDER — PANTOPRAZOLE SODIUM 40 MG IV SOLR
40.0000 mg | Freq: Two times a day (BID) | INTRAVENOUS | Status: DC
  Administered 2019-07-15 – 2019-07-23 (×17): 40 mg via INTRAVENOUS
  Filled 2019-07-15 (×17): qty 40

## 2019-07-15 NOTE — Progress Notes (Signed)
PT Cancellation Note  Patient Details Name: Maria Fox MRN: [id number] DOB: 06/11/1945   Cancelled Treatment:    Reason Eval/Treat Not Completed: Medical issues which prohibited therapy; patient continues with nausea and HA.  RN reports on scheduled Zofran and gave Migraine medication as well.  Reports patient up to [facility] as needed.  Will attempt again another day.    Reginia Naas 07/15/2019, 2:47 PM  Magda Kiel, [place] [phone] 07/15/2019

## 2019-07-15 NOTE — Progress Notes (Signed)
Plan of care reviewed. Pt's been progressing. She's hemodynamically stable. Remained afebrile. Sinus rhythm/ sinus bradycardia on monitor, HR 55-60s, BP within normal limits. SPO2 98% on room air.   Abdominal incision sites dry and clean, no drainage. Complained having some headache and nausea. Tylenol and Zofran given PRN, symptoms relieved, pain tolerated well. No immediate distress noted. Will continue to monitor.  Kennyth Lose, RN

## 2019-07-15 NOTE — Progress Notes (Signed)
Patient ID: Maria Fox, female   DOB: 1945/07/06, 74 y.o.   MRN: [id number]  PROGRESS NOTE    Maria Fox  [name] DOB: [birth year] DOA: 07/06/2019 PCP: [name], MD   Brief Narrative:  74 year old female with HTN, HLD, DM, history of CVA, CAD status post CABG, came into the hospital with abdominal pain, mainly on the right side which started about 1 to 2 weeks ago.  She was diagnosed with acute cholecystitis and started on IV antibiotics.  She underwent lap chole on 06/29/2019.  Postsurgical course was complicated by atrial flutter, chest pain, respite distress and acute DVT/presumed PE.  She was treated with heparin and was subsequently switched to oral Eliquis.  Cardiology was also consulted and patient was started on IV amiodarone.   Assessment & Plan:   Acute cholecystitis Postoperative nausea -status post laparoscopic cholecystectomy on 07/07/19.  Appreciate surgery follow-up, seems to be recovering well -Still has intermittent nausea with poor oral intake. -Patient still does not feel very to go home yet.  We will switch to around-the-clock Zofran for 24 hours. -Add IV Protonix and as needed Maalox.  Headache -Complains of intermittent headache with nausea.  Will use Excedrin as needed.  Acute DVT, presumed pulmonary embolism with acute hypoxic respiratory failure -patient with chest pain and new onset a flutter on 07/10/19.  D-dimer was elevated and she was empirically started on heparin out of concern for PE.  Lower extremity Dopplers did show an acute DVT.  CT angiogram was initially ordered however she does have an allergy to IV contrast and borderline renal function, and given acute DVT I would presume she also has a PE and CT scan was canceled.  2D echo did not show any RV strain, normal LVEF -Tolerated heparin well, hemoglobin is stable and she was started on Eliquis on 07/12/19  A flutter/chest pain -cardiology consult appreciated, she was started on amiodarone  and now she is in sinus rhythm.  Continue beta-blockers, cardiology recommending IV amiodarone up until discharge per their note.  They signed off and set up outpatient follow-up appointment -Because of intermittent nausea/vomiting, I discontinued IV amiodarone on 5 afternoon.  Heart rate currently stable.  Acute hypoxic respiratory failure -Incentive spirometry.  Patient received a dose of IV Lasix on 07/14/2019.   -Currently on room air.  Acute kidney injury on chronic kidney disease stage IIIa -creatinine overall stable, did increase slightly after Lasix when she was in respiratory distress on 5/2 -Creatinine slightly bumped up at 1.4 today.  She did receive a dose of Lasix again on 07/14/2019.  Hold off on Lasix.  Monitor creatinine.  Essential hypertension-continue metoprolol  Type 2 diabetes mellitus -CBGs with with sliding scale.  Continue Lantus  Hyperlipidemia-continue atorvastatin  Coronary artery disease, history of chronic diastolic CHF, history of CABG -Currently stable. Troponins flat     DVT prophylaxis: Eliquis Code Status: Full Family Communication: Patient at bedside Disposition Plan: Status is: Inpatient  Remains inpatient appropriate because: Still intermittently nauseous with very poor oral intake and with intermittent headaches.  Does not feel ready to go home yet today.  Hopefully discharge in 1 to 2 days.  Dispo: The patient is from: Home              Anticipated d/c is to: Home              Anticipated d/c date is: 1 day              Patient currently  is not medically stable to d/c.    Consultants: General surgery/cardiology  Procedures: Laparoscopic cholecystectomy on 07/07/2019  Antimicrobials:  Anti-infectives (From admission, onward)   Start     Dose/Rate Route Frequency Ordered Stop   07/08/19 0500  cefTRIAXone (ROCEPHIN) 2 g in sodium chloride 0.9 % 100 mL IVPB  Status:  Discontinued     2 g 200 mL/hr over 30 Minutes Intravenous Every 24  hours 07/07/19 0839 07/07/19 1802   07/07/19 0245  cefTRIAXone (ROCEPHIN) 2 g in sodium chloride 0.9 % 100 mL IVPB     2 g 200 mL/hr over 30 Minutes Intravenous  Once 07/07/19 0231 07/07/19 0451       Subjective: Patient seen and examined at bedside.  She does not feel well and still complains of intermittent nausea.  Her oral intake is still very poor.  Still complains of intermittent headache.  No worsening abdominal pain.  No overnight fevers.  Does not feel ready to go home today.   Objective: Vitals:   07/14/19 1615 07/14/19 1915 07/14/19 2320 07/15/19 0803  BP: (!) 148/65 (!) 155/74 120/65 130/69  Pulse: (!) 58 (!) 59 63 63  Resp: 17 12 20 20   Temp: (!) 97.5 F (36.4 C) 97.6 F (36.4 C) 97.8 F (36.6 C) 97.6 F (36.4 C)  TempSrc: Oral Oral Oral Oral  SpO2: 93% 94% 98% 98%  Weight:      Height:        Intake/Output Summary (Last 24 hours) at 07/15/2019 0937 Last data filed at 07/14/2019 2327 Gross per 24 hour  Intake 500 ml  Output 2500 ml  Net -2000 ml   Filed Weights   07/06/19 1842 07/10/19 1600  Weight: 61.2 kg 71.7 kg    Examination:  General exam: No distress.  Chronically ill looking. Respiratory system: Bilateral decreased breath sounds at bases with no wheezing.  Some scattered crackles.   Cardiovascular system: S1 & S2 heard; intermittently bradycardic.  Gastrointestinal system: Abdomen is nondistended, soft and nontender.  Incisions are clean and dry.  Normal bowel sounds heard. Extremities: Mild lower extremity edema present.  No clubbing Central nervous system: Awake and alert.  No focal neurological deficits. Moving extremities Skin: No ulcers or lesions. Psychiatry: Anxious looking    Data Reviewed: I have personally reviewed following labs and imaging studies  CBC: Recent Labs  Lab 07/11/19 0029 07/12/19 0302 07/13/19 0341 07/14/19 0351 07/15/19 0237  WBC 7.7 6.2 6.1 5.6 5.9  NEUTROABS  --   --   --   --  3.3  HGB 10.9* 11.3* 11.2*  11.8* 12.8  HCT 33.1* 34.6* 34.9* 35.9* 38.1  MCV 90.4 92.3 92.8 91.1 90.1  PLT 192 174 201 203 [id number]   Basic Metabolic Panel: Recent Labs  Lab 07/11/19 0029 07/12/19 0302 07/13/19 0341 07/14/19 0351 07/15/19 0237  NA 134* 142 139 140 136  K 4.0 4.0 4.0 4.0 3.9  CL 104 111 107 107 102  CO2 23 21* 21* 23 27  GLUCOSE 319* 141* 113* 147* 123*  BUN 12 9 8 8  7*  CREATININE 1.32* 1.54* 1.24* 1.17* 1.42*  CALCIUM 8.3* 8.7* 8.6* 8.8* 8.7*  MG  --  1.7  --  1.7 1.8   GFR: Estimated Creatinine Clearance: 31.2 mL/min (A) (by C-G formula based on SCr of 1.42 mg/dL (H)). Liver Function Tests: Recent Labs  Lab 07/10/19 0247 07/11/19 0029 07/12/19 0302 07/13/19 0341 07/15/19 0237  AST 17 18 16 22 28   ALT 19 20  17 18 27   ALKPHOS 52 66 65 56 75  BILITOT 0.3 0.4 0.2* 0.6 0.5  PROT 4.5* 5.1* 5.1* 4.8* 6.0*  ALBUMIN 2.0* 2.4* 2.5* 2.4* 3.1*   No results for input(s): LIPASE, AMYLASE in the last 168 hours. No results for input(s): AMMONIA in the last 168 hours. Coagulation Profile: No results for input(s): INR, PROTIME in the last 168 hours. Cardiac Enzymes: No results for input(s): CKTOTAL, CKMB, CKMBINDEX, TROPONINI in the last 168 hours. BNP (last 3 results) No results for input(s): PROBNP in the last 8760 hours. HbA1C: No results for input(s): HGBA1C in the last 72 hours. CBG: Recent Labs  Lab 07/14/19 0828 07/14/19 1138 07/14/19 1738 07/14/19 2041 07/15/19 0701  GLUCAP 190* 199* 142* 89 100*   Lipid Profile: No results for input(s): CHOL, HDL, LDLCALC, TRIG, CHOLHDL, LDLDIRECT in the last 72 hours. Thyroid Function Tests: No results for input(s): TSH, T4TOTAL, FREET4, T3FREE, THYROIDAB in the last 72 hours. Anemia Panel: No results for input(s): VITAMINB12, FOLATE, FERRITIN, TIBC, IRON, RETICCTPCT in the last 72 hours. Sepsis Labs: No results for input(s): PROCALCITON, LATICACIDVEN in the last 168 hours.  Recent Results (from the past 240 hour(s))  Culture, blood  (Routine X 2) w Reflex to ID Panel     Status: None   Collection Time: 07/07/19  2:50 AM   Specimen: BLOOD  Result Value Ref Range Status   Specimen Description BLOOD LEFT ARM  Final   Special Requests   Final    BOTTLES DRAWN AEROBIC AND ANAEROBIC Blood Culture results may not be optimal due to an excessive volume of blood received in culture bottles   Culture   Final    NO GROWTH 5 DAYS Performed at [facility], [place] [address], [place], [place] [address]    Report Status 07/12/2019 FINAL  Final  Culture, blood (Routine X 2) w Reflex to ID Panel     Status: None   Collection Time: 07/07/19  3:10 AM   Specimen: BLOOD  Result Value Ref Range Status   Specimen Description BLOOD RIGHT FOREARM  Final   Special Requests   Final    BOTTLES DRAWN AEROBIC ONLY Blood Culture results may not be optimal due to an inadequate volume of blood received in culture bottles   Culture   Final    NO GROWTH 5 DAYS Performed at [facility], [place] [address] [place],  [address]    Report Status 07/12/2019 FINAL  Final  Respiratory Panel by RT PCR (Flu A&B, Covid) - Nasopharyngeal Swab     Status: None   Collection Time: 07/07/19  6:35 AM   Specimen: Nasopharyngeal Swab  Result Value Ref Range Status   SARS Coronavirus 2 by RT PCR NEGATIVE NEGATIVE Final    Comment: (NOTE) SARS-CoV-2 target nucleic acids are NOT DETECTED. The SARS-CoV-2 RNA is generally detectable in upper respiratoy specimens during the acute phase of infection. The lowest concentration of SARS-CoV-2 viral copies this assay can detect is 131 copies/mL. A negative result does not preclude SARS-Cov-2 infection and should not be used as the sole basis for treatment or other patient management decisions. A negative result may occur with  improper specimen collection/handling, submission of specimen other than nasopharyngeal swab, presence of viral mutation(s) within the areas targeted by this assay, and inadequate  number of viral copies (<131 copies/mL). A negative result must be combined with clinical observations, patient history, and epidemiological information. The expected result is Negative. Fact Sheet for  Patients:  PinkCheek.be Fact Sheet for Healthcare Providers:  GravelBags.it This test is not yet ap proved or cleared by the [place] and  has been authorized for detection and/or diagnosis of SARS-CoV-2 by FDA under an Emergency Use Authorization (EUA). This EUA will remain  in effect (meaning this test can be used) for the duration of the COVID-19 declaration under Section 564(b)(1) of the Act, 21 U.S.C. section 360bbb-3(b)(1), unless the authorization is terminated or revoked sooner.    Influenza A by PCR NEGATIVE NEGATIVE Final   Influenza B by PCR NEGATIVE NEGATIVE Final    Comment: (NOTE) The Xpert Xpress SARS-CoV-2/FLU/RSV assay is intended as an aid in  the diagnosis of influenza from Nasopharyngeal swab specimens and  should not be used as a sole basis for treatment. Nasal washings and  aspirates are unacceptable for Xpert Xpress SARS-CoV-2/FLU/RSV  testing. Fact Sheet for Patients: PinkCheek.be Fact Sheet for Healthcare Providers: GravelBags.it This test is not yet approved or cleared by the [place] FDA and  has been authorized for detection and/or diagnosis of SARS-CoV-2 by  FDA under an Emergency Use Authorization (EUA). This EUA will remain  in effect (meaning this test can be used) for the duration of the  Covid-19 declaration under Section 564(b)(1) of the Act, 21  U.S.C. section 360bbb-3(b)(1), unless the authorization is  terminated or revoked. Performed at [facility], [place] [address] [place], [place] [address]   Culture, Urine     Status: Abnormal   Collection Time: 07/09/19 12:12 PM   Specimen: Urine, Random  Result Value  Ref Range Status   Specimen Description URINE, RANDOM  Final   Special Requests NONE  Final   Culture (A)  Final    <10,000 COLONIES/mL INSIGNIFICANT GROWTH Performed at [place] [address], [place], [place] [address]    Report Status 07/10/2019 FINAL  Final         Radiology Studies: No results found.      Scheduled Meds: . apixaban  10 mg Oral BID   Followed by  . [START ON 07/17/2019] apixaban  5 mg Oral BID  . atorvastatin  40 mg Oral Daily  . docusate sodium  100 mg Oral BID  . feeding supplement (ENSURE ENLIVE)  237 mL Oral TID BM  . insulin aspart  0-15 Units Subcutaneous TID WC  . insulin aspart  0-5 Units Subcutaneous QHS  . insulin glargine  5 Units Subcutaneous Daily  . isosorbide mononitrate  15 mg Oral Daily  . latanoprost  1 drop Both Eyes QHS  . metoprolol tartrate  12.5 mg Oral BID  . multivitamin with minerals  1 tablet Oral Daily  . polyethylene glycol  17 g Oral Daily   Continuous Infusions:         Aline August, MD [facility] 07/15/2019, 9:37 AM

## 2019-07-15 NOTE — Progress Notes (Signed)
Patient ID: Maria Fox, female   DOB: 1945-07-17, 74 y.o.   MRN: [id number]    8 Days Post-Op  Subjective: Feels terrible today due to persistent HA of unclear etiology as well as horrible nausea.  Trying to eat some rice krispies but struggling.  Had a small BM yesterday  ROS: See above, otherwise other systems negative  Objective: Vital signs in last 24 hours: Temp:  [97.5 F (36.4 C)-98.6 F (37 C)] 97.6 F (36.4 C) (05/06 0803) Pulse Rate:  [57-63] 63 (05/06 0803) Resp:  [12-20] 20 (05/06 0803) BP: (120-155)/(64-74) 130/69 (05/06 0803) SpO2:  [93 %-98 %] 98 % (05/06 0803) Last BM Date: 07/12/19  Intake/Output from previous day: 05/05 0701 - 05/06 0700 In: 500 [P.O.:500] Out: 2500 [Urine:2500] Intake/Output this shift: No intake/output data recorded.  PE: Abd: soft, NT, ND, +BS, incisions c/d/i  Lab Results:  Recent Labs    07/14/19 0351 07/15/19 0237  WBC 5.6 5.9  HGB 11.8* 12.8  HCT 35.9* 38.1  PLT 203 228   BMET Recent Labs    07/14/19 0351 07/15/19 0237  NA 140 136  K 4.0 3.9  CL 107 102  CO2 23 27  GLUCOSE 147* 123*  BUN 8 7*  CREATININE 1.17* 1.42*  CALCIUM 8.8* 8.7*   PT/INR No results for input(s): LABPROT, INR in the last 72 hours. CMP     Component Value Date/Time   NA 136 07/15/2019 0237   NA 140 12/11/2017 1027   K 3.9 07/15/2019 0237   CL 102 07/15/2019 0237   CO2 27 07/15/2019 0237   GLUCOSE 123 (H) 07/15/2019 0237   BUN 7 (L) 07/15/2019 0237   BUN 15 12/11/2017 1027   CREATININE 1.42 (H) 07/15/2019 0237   CALCIUM 8.7 (L) 07/15/2019 0237   PROT 6.0 (L) 07/15/2019 0237   PROT 6.1 10/17/2017 0927   ALBUMIN 3.1 (L) 07/15/2019 0237   ALBUMIN 4.0 10/17/2017 0927   AST 28 07/15/2019 0237   ALT 27 07/15/2019 0237   ALKPHOS 75 07/15/2019 0237   BILITOT 0.5 07/15/2019 0237   BILITOT 0.3 10/17/2017 0927   GFRNONAA 36 (L) 07/15/2019 0237   GFRAA 41 (L) 07/15/2019 0237   Lipase     Component Value Date/Time   LIPASE 29  07/06/2019 1906       Studies/Results: No results found.  Anti-infectives: Anti-infectives (From admission, onward)   Start     Dose/Rate Route Frequency Ordered Stop   07/08/19 0500  cefTRIAXone (ROCEPHIN) 2 g in sodium chloride 0.9 % 100 mL IVPB  Status:  Discontinued     2 g 200 mL/hr over 30 Minutes Intravenous Every 24 hours 07/07/19 0839 07/07/19 1802   07/07/19 0245  cefTRIAXone (ROCEPHIN) 2 g in sodium chloride 0.9 % 100 mL IVPB     2 g 200 mL/hr over 30 Minutes Intravenous  Once 07/07/19 0231 07/07/19 0451       Assessment/Plan Hypertension Hx CHF CAD with Hx CABG/PTCA stents- on Plavix@ home Hx CVA Type 2 diabetes Neuropathy CKD Hx insomnia Iron/B12 deficiencies AKI Atrial flutter  Acute DVT --per TRH--  Acute cholecystitis POD 8, s/p Lap Chole, 4/28, Dr. Ninfa Linden, acute cholecystitis -unclear her etiology of her nausea.  She is not having any post surgical complications to suggest this is from her surgery.  I do not know if this is medication induced, HA induced, etc.  D/w Dr. Starla Link today. -surgically stable at this time.  Please call us if we can be  of further assistance.  FEN:HH/CM ID: RocephinperiopNo further abx indicated. DVT: SCDs,eliquis Follow-up:DOW   LOS: 8 days    Henreitta Cea , [facility] 07/15/2019, 9:10 AM Please see Amion for pager number during day hours 7:00am-4:30pm or 7:00am -11:30am on weekends

## 2019-07-16 ENCOUNTER — Inpatient Hospital Stay (HOSPITAL_COMMUNITY): Payer: Medicare HMO

## 2019-07-16 DIAGNOSIS — R519 Headache, unspecified: Secondary | ICD-10-CM

## 2019-07-16 LAB — CBC WITH DIFFERENTIAL/PLATELET
Abs Immature Granulocytes: 0.05 10*3/uL (ref 0.00–0.07)
Basophils Absolute: 0.1 10*3/uL (ref 0.0–0.1)
Basophils Relative: 1 %
Eosinophils Absolute: 0.4 10*3/uL (ref 0.0–0.5)
Eosinophils Relative: 6 %
HCT: 37.1 % (ref 36.0–46.0)
Hemoglobin: 12.3 g/dL (ref 12.0–15.0)
Immature Granulocytes: 1 %
Lymphocytes Relative: 31 %
Lymphs Abs: 1.9 10*3/uL (ref 0.7–4.0)
MCH: 30 pg (ref 26.0–34.0)
MCHC: 33.2 g/dL (ref 30.0–36.0)
MCV: 90.5 fL (ref 80.0–100.0)
Monocytes Absolute: 0.6 10*3/uL (ref 0.1–1.0)
Monocytes Relative: 9 %
Neutro Abs: 3.2 10*3/uL (ref 1.7–7.7)
Neutrophils Relative %: 52 %
Platelets: 242 10*3/uL (ref 150–400)
RBC: 4.1 MIL/uL (ref 3.87–5.11)
RDW: 13 % (ref 11.5–15.5)
WBC: 6.2 10*3/uL (ref 4.0–10.5)
nRBC: 0 % (ref 0.0–0.2)

## 2019-07-16 LAB — COMPREHENSIVE METABOLIC PANEL
ALT: 22 U/L (ref 0–44)
AST: 22 U/L (ref 15–41)
Albumin: 2.9 g/dL — ABNORMAL LOW (ref 3.5–5.0)
Alkaline Phosphatase: 71 U/L (ref 38–126)
Anion gap: 10 (ref 5–15)
BUN: 12 mg/dL (ref 8–23)
CO2: 26 mmol/L (ref 22–32)
Calcium: 8.5 mg/dL — ABNORMAL LOW (ref 8.9–10.3)
Chloride: 100 mmol/L (ref 98–111)
Creatinine, Ser: 1.61 mg/dL — ABNORMAL HIGH (ref 0.44–1.00)
GFR calc Af Amer: 36 mL/min — ABNORMAL LOW (ref >60)
GFR calc non Af Amer: 31 mL/min — ABNORMAL LOW (ref >60)
Glucose, Bld: 138 mg/dL — ABNORMAL HIGH (ref 70–99)
Potassium: 4.1 mmol/L (ref 3.5–5.1)
Sodium: 136 mmol/L (ref 135–145)
Total Bilirubin: 0.6 mg/dL (ref 0.3–1.2)
Total Protein: 5.7 g/dL — ABNORMAL LOW (ref 6.5–8.1)

## 2019-07-16 LAB — GLUCOSE, CAPILLARY
Glucose-Capillary: 138 mg/dL — ABNORMAL HIGH (ref 70–99)
Glucose-Capillary: 132 mg/dL — ABNORMAL HIGH (ref 70–99)
Glucose-Capillary: 207 mg/dL — ABNORMAL HIGH (ref 70–99)
Glucose-Capillary: 77 mg/dL (ref 70–99)

## 2019-07-16 LAB — MAGNESIUM: Magnesium: 1.7 mg/dL (ref 1.7–2.4)

## 2019-07-16 MED ORDER — KETOROLAC TROMETHAMINE 30 MG/ML IJ SOLN
15.0000 mg | Freq: Once | INTRAMUSCULAR | Status: AC
  Administered 2019-07-16: 15 mg via INTRAVENOUS
  Filled 2019-07-16: qty 1

## 2019-07-16 MED ORDER — SUMATRIPTAN SUCCINATE 100 MG PO TABS
100.0000 mg | ORAL_TABLET | Freq: Once | ORAL | Status: AC
  Administered 2019-07-16: 100 mg via ORAL
  Filled 2019-07-16: qty 1

## 2019-07-16 MED ORDER — SODIUM CHLORIDE 0.9 % IV BOLUS
500.0000 mL | Freq: Once | INTRAVENOUS | Status: AC
  Administered 2019-07-16: 500 mL via INTRAVENOUS

## 2019-07-16 MED ORDER — PROMETHAZINE HCL 25 MG PO TABS
25.0000 mg | ORAL_TABLET | Freq: Four times a day (QID) | ORAL | Status: DC | PRN
  Administered 2019-07-16 – 2019-07-17 (×2): 25 mg via ORAL
  Filled 2019-07-16 (×3): qty 1

## 2019-07-16 MED ORDER — CLOPIDOGREL BISULFATE 75 MG PO TABS: 75.0000 mg | ORAL_TABLET | Freq: Every day | ORAL | 1 refills | Status: DC

## 2019-07-16 NOTE — Progress Notes (Signed)
Plan of care reviewed. Pt complained having nausea and headache , pain scale 7/10. Denied abdominal pain. Zofran and Reglan given.  Pt's hemodynamically stable. Sinus rhythm/sinus bradycardia on monitor. HR 50s-60s, BP 111/49- 138/ 81 mmHg, SPO2 100% on room air.   Alternated pain medications between Exerin Migraine, Tylenol and tramadol. Stated she was unable to sleep and headache did not relived. She refused TraZodone, stated those medicines did not help her previously. Emotional support given.   Notified on-call ,NP X. Blount. Order received for 500 ml NS bolus and Toradol IV once. Symptoms relieved. She was able to sleep. Will continue to monitor.  Kennyth Lose, RN, [place], [place]

## 2019-07-16 NOTE — Progress Notes (Signed)
Physical Therapy Treatment Patient Details Name: Maria Fox MRN: [id number] DOB: 03-14-1945 Today's Date: 07/16/2019    History of Present Illness Pt is 74 yo female with HTN, HLD, DM, history of CVA, CAD status post CABG, came into the hospital with abdominal pain, mainly on the right side which started about 1 to 2 weeks ago.  She was diagnosed with acute cholecystitis and is s/p lap chole on 4/28. Postoperative course was complicated by development of a flutter, chest pain, respiratory distress and acute DVT /presumed PE    PT Comments    Pt's activity and mobility limited by nausea. Needs max encouragement to mobilize. Returns to bed despite encouragement to sit up in chair.    Follow Up Recommendations  Home health PT     Equipment Recommendations  None recommended by PT    Recommendations for Other Services       Precautions / Restrictions Precautions Precautions: None    Mobility  Bed Mobility Overal bed mobility: Modified Independent Bed Mobility: Supine to Sit;Sit to Supine     Supine to sit: Modified independent (Device/Increase time);HOB elevated Sit to supine: Modified independent (Device/Increase time);HOB elevated      Transfers Overall transfer level: Needs assistance Equipment used: 4-wheeled walker;None Transfers: Sit to/from Stand Sit to Stand: Supervision         General transfer comment: Assist for safety  Ambulation/Gait Ambulation/Gait assistance: Supervision;Min guard Gait Distance (Feet): 250 Feet Assistive device: 4-wheeled walker;None Gait Pattern/deviations: Step-through pattern;Decreased stride length Gait velocity: decr Gait velocity interpretation: 1.31 - 2.62 ft/sec, indicative of limited community ambulator General Gait Details: supervision when using rollator and min guard without assistive device   Stairs             Wheelchair Mobility    Modified Rankin (Stroke Patients Only)       Balance Overall balance  assessment: Needs assistance Sitting-balance support: No upper extremity supported;Feet supported Sitting balance-Leahy Scale: Good     Standing balance support: No upper extremity supported Standing balance-Leahy Scale: Fair                              Cognition Arousal/Alertness: Awake/alert Behavior During Therapy: WFL for tasks assessed/performed Overall Cognitive Status: Within Functional Limits for tasks assessed                                        Exercises      General Comments General comments (skin integrity, edema, etc.): VSS      Pertinent Vitals/Pain Pain Assessment: No/denies pain    Home Living                      Prior Function            PT Goals (current goals can now be found in the care plan section) Acute Rehab PT Goals Patient Stated Goal: return home Progress towards PT goals: Progressing toward goals    Frequency    Min 3X/week      PT Plan Discharge plan needs to be updated    Co-evaluation              AM-PAC PT "6 Clicks" Mobility   Outcome Measure  Help needed turning from your back to your side while in a flat bed without using bedrails?: None Help needed  moving from lying on your back to sitting on the side of a flat bed without using bedrails?: None Help needed moving to and from a bed to a chair (including a wheelchair)?: A Little Help needed standing up from a chair using your arms (e.g., wheelchair or bedside chair)?: None Help needed to walk in hospital room?: A Little Help needed climbing 3-5 steps with a railing? : A Little 6 Click Score: 21    End of Session   Activity Tolerance: Patient tolerated treatment well Patient left: in bed;with call bell/phone within reach Nurse Communication: Mobility status PT Visit Diagnosis: Unsteadiness on feet (R26.81);Muscle weakness (generalized) (M62.81)     Time: [id number] PT Time Calculation (min) (ACUTE ONLY): 13  min  Charges:  $Gait Training: 8-22 mins                     [place] Pager [phone] Office [place] 07/16/2019, 3:47 PM

## 2019-07-16 NOTE — Progress Notes (Signed)
Patient ID: Maria Fox, female   DOB: Feb 10, 1946, 74 y.o.   MRN: [id number]  PROGRESS NOTE    Maria Fox  [name] DOB: 1945-07-08 DOA: 07/06/2019 PCP: [name], MD   Brief Narrative:  74 year old female with HTN, HLD, DM, history of CVA, CAD status post CABG, came into the hospital with abdominal pain, mainly on the right side which started about 1 to 2 weeks ago.  She was diagnosed with acute cholecystitis and started on IV antibiotics.  She underwent lap chole on 06/29/2019.  Postsurgical course was complicated by atrial flutter, chest pain, respite distress and acute DVT/presumed PE.  She was treated with heparin and was subsequently switched to oral Eliquis.  Cardiology was also consulted and patient was started on IV amiodarone.   Assessment & Plan:   Acute cholecystitis Postoperative nausea -status post laparoscopic cholecystectomy on 07/07/19.  Appreciate surgery follow-up, seems to be recovering well -Still has intermittent nausea with poor oral intake. -Despite being on around-the-clock Zofran and twice a day Protonix, she still feels that she is very nauseous.  Her LFTs are still normal.  Will check CT of the abdomen with p.o contrast.  General surgery has signed off. -Add Phenergan as needed.  Headache -Complains of intermittent headache with nausea.  Continue Excedrin. -CT of the head without contrast.  Acute DVT, presumed pulmonary embolism with acute hypoxic respiratory failure -patient with chest pain and new onset a flutter on 07/10/19.  D-dimer was elevated and she was empirically started on heparin out of concern for PE.  Lower extremity Dopplers did show an acute DVT.  CT angiogram was initially ordered however she does have an allergy to IV contrast and borderline renal function, and given acute DVT I would presume she also has a PE and CT scan was canceled.  2D echo did not show any RV strain, normal LVEF -Tolerated heparin well, hemoglobin is stable and  she was started on Eliquis on 07/12/19.    A flutter/chest pain -cardiology consult appreciated, she was started on amiodarone and now she is in sinus rhythm.  Continue beta-blockers, cardiology recommending IV amiodarone up until discharge per their note.  They signed off and set up outpatient follow-up appointment -Because of intermittent nausea/vomiting, I discontinued IV amiodarone on 07/14/2019.  Heart rate currently stable. -Outpatient follow-up with cardiology.  Acute hypoxic respiratory failure -Incentive spirometry.  Patient received a dose of IV Lasix on 07/14/2019.   -Currently on room air.  Acute kidney injury on chronic kidney disease stage IIIa -creatinine overall stable, did increase slightly after Lasix when she was in respiratory distress on 5/2 -Creatinine slightly bumped up at 1.61 today.  She did receive a dose of Lasix again on 07/14/2019.  Hold off on Lasix.  Monitor creatinine.  Essential hypertension-continue metoprolol  Type 2 diabetes mellitus -CBGs with with sliding scale.  Continue Lantus  Hyperlipidemia-continue atorvastatin  Coronary artery disease, history of chronic diastolic CHF, history of CABG -Currently stable. Troponins flat     DVT prophylaxis: Eliquis Code Status: Full Family Communication: Patient at bedside Disposition Plan: Status is: Inpatient  Remains inpatient appropriate because: Still intermittently nauseous with very poor oral intake and with intermittent headaches.  Does not feel ready to go home yet today.  Hopefully discharge in 1 to 2 days once her nausea and headache is improved.  Dispo: The patient is from: Home              Anticipated d/c is to: Home  Anticipated d/c date is: 1 day              Patient currently is not medically stable to d/c.    Consultants: General surgery/cardiology  Procedures: Laparoscopic cholecystectomy on 07/07/2019  Antimicrobials:  Anti-infectives (From admission, onward)   Start      Dose/Rate Route Frequency Ordered Stop   07/08/19 0500  cefTRIAXone (ROCEPHIN) 2 g in sodium chloride 0.9 % 100 mL IVPB  Status:  Discontinued     2 g 200 mL/hr over 30 Minutes Intravenous Every 24 hours 07/07/19 0839 07/07/19 1802   07/07/19 0245  cefTRIAXone (ROCEPHIN) 2 g in sodium chloride 0.9 % 100 mL IVPB     2 g 200 mL/hr over 30 Minutes Intravenous  Once 07/07/19 0231 07/07/19 0451       Subjective: Patient seen and examined at bedside.  She does not feel well at all.  Still complains of intermittent nausea and very bad headaches.  She feels dizzy when she stands up.  No overnight fevers.  Objective: Vitals:   07/16/19 0015 07/16/19 0200 07/16/19 0422 07/16/19 0723  BP: (!) 111/49  (!) 117/59 135/67  Pulse: (!) 55  61 (!) 56  Resp: 13 13 12 15   Temp: 97.6 F (36.4 C)  97.8 F (36.6 C) 97.7 F (36.5 C)  TempSrc: Oral  Oral Oral  SpO2: 100%  95% 95%  Weight:   68.9 kg   Height:        Intake/Output Summary (Last 24 hours) at 07/16/2019 1034 Last data filed at 07/16/2019 0242 Gross per 24 hour  Intake 758.02 ml  Output 400 ml  Net 358.02 ml   Filed Weights   07/06/19 1842 07/10/19 1600 07/16/19 0422  Weight: 61.2 kg 71.7 kg 68.9 kg    Examination:  General exam: Mild distress secondary to headache.  Chronically ill looking. Respiratory system: Bilateral decreased breath sounds at bases with some scattered crackles.   Cardiovascular system: S1 & S2 heard; mild intermittent bradycardia gastrointestinal system: Abdomen is nondistended, soft and nontender.  Incisions are clean and dry.  Bowel sounds heard. Extremities: No cyanosis.  Trace lower extremity edema present Central nervous system: Awake and oriented no focal neurological deficits. Moving extremities Skin: No rashes or ulcers. Psychiatry: Looks extremely anxious   Data Reviewed: I have personally reviewed following labs and imaging studies  CBC: Recent Labs  Lab 07/12/19 0302 07/13/19 0341 07/14/19  0351 07/15/19 0237 07/16/19 0310  WBC 6.2 6.1 5.6 5.9 6.2  NEUTROABS  --   --   --  3.3 3.2  HGB 11.3* 11.2* 11.8* 12.8 12.3  HCT 34.6* 34.9* 35.9* 38.1 37.1  MCV 92.3 92.8 91.1 90.1 90.5  PLT 174 201 203 228 [id number]   Basic Metabolic Panel: Recent Labs  Lab 07/12/19 0302 07/13/19 0341 07/14/19 0351 07/15/19 0237 07/16/19 0310  NA 142 139 140 136 136  K 4.0 4.0 4.0 3.9 4.1  CL 111 107 107 102 100  CO2 21* 21* 23 27 26   GLUCOSE 141* 113* 147* 123* 138*  BUN 9 8 8  7* 12  CREATININE 1.54* 1.24* 1.17* 1.42* 1.61*  CALCIUM 8.7* 8.6* 8.8* 8.7* 8.5*  MG 1.7  --  1.7 1.8 1.7   GFR: Estimated Creatinine Clearance: 27 mL/min (A) (by C-G formula based on SCr of 1.61 mg/dL (H)). Liver Function Tests: Recent Labs  Lab 07/11/19 0029 07/12/19 0302 07/13/19 0341 07/15/19 0237 07/16/19 0310  AST 18 16 22 28 22   ALT 20  17 18 27 22   ALKPHOS 66 65 56 75 71  BILITOT 0.4 0.2* 0.6 0.5 0.6  PROT 5.1* 5.1* 4.8* 6.0* 5.7*  ALBUMIN 2.4* 2.5* 2.4* 3.1* 2.9*   Recent Labs  Lab 07/15/19 0237  LIPASE 18   No results for input(s): AMMONIA in the last 168 hours. Coagulation Profile: No results for input(s): INR, PROTIME in the last 168 hours. Cardiac Enzymes: No results for input(s): CKTOTAL, CKMB, CKMBINDEX, TROPONINI in the last 168 hours. BNP (last 3 results) No results for input(s): PROBNP in the last 8760 hours. HbA1C: No results for input(s): HGBA1C in the last 72 hours. CBG: Recent Labs  Lab 07/15/19 0701 07/15/19 1123 07/15/19 1556 07/15/19 2124 07/16/19 0618  GLUCAP 100* 190* 171* 147* 138*   Lipid Profile: No results for input(s): CHOL, HDL, LDLCALC, TRIG, CHOLHDL, LDLDIRECT in the last 72 hours. Thyroid Function Tests: No results for input(s): TSH, T4TOTAL, FREET4, T3FREE, THYROIDAB in the last 72 hours. Anemia Panel: No results for input(s): VITAMINB12, FOLATE, FERRITIN, TIBC, IRON, RETICCTPCT in the last 72 hours. Sepsis Labs: No results for input(s): PROCALCITON,  LATICACIDVEN in the last 168 hours.  Recent Results (from the past 240 hour(s))  Culture, blood (Routine X 2) w Reflex to ID Panel     Status: None   Collection Time: 07/07/19  2:50 AM   Specimen: BLOOD  Result Value Ref Range Status   Specimen Description BLOOD LEFT ARM  Final   Special Requests   Final    BOTTLES DRAWN AEROBIC AND ANAEROBIC Blood Culture results may not be optimal due to an excessive volume of blood received in culture bottles   Culture   Final    NO GROWTH 5 DAYS Performed at [facility], [place] [address], [place], [place] [address]    Report Status 07/12/2019 FINAL  Final  Culture, blood (Routine X 2) w Reflex to ID Panel     Status: None   Collection Time: 07/07/19  3:10 AM   Specimen: BLOOD  Result Value Ref Range Status   Specimen Description BLOOD RIGHT FOREARM  Final   Special Requests   Final    BOTTLES DRAWN AEROBIC ONLY Blood Culture results may not be optimal due to an inadequate volume of blood received in culture bottles   Culture   Final    NO GROWTH 5 DAYS Performed at [facility], [place] [address], [place], [place] [address]    Report Status 07/12/2019 FINAL  Final  Respiratory Panel by RT PCR (Flu A&B, Covid) - Nasopharyngeal Swab     Status: None   Collection Time: 07/07/19  6:35 AM   Specimen: Nasopharyngeal Swab  Result Value Ref Range Status   SARS Coronavirus 2 by RT PCR NEGATIVE NEGATIVE Final    Comment: (NOTE) SARS-CoV-2 target nucleic acids are NOT DETECTED. The SARS-CoV-2 RNA is generally detectable in upper respiratoy specimens during the acute phase of infection. The lowest concentration of SARS-CoV-2 viral copies this assay can detect is 131 copies/mL. A negative result does not preclude SARS-Cov-2 infection and should not be used as the sole basis for treatment or other patient management decisions. A negative result may occur with  improper specimen collection/handling, submission of specimen other than  nasopharyngeal swab, presence of viral mutation(s) within the areas targeted by this assay, and inadequate number of viral copies (<131 copies/mL). A negative result must be combined with clinical observations, patient history, and epidemiological information. The expected result is Negative. Fact Sheet  for Patients:  PinkCheek.be Fact Sheet for Healthcare Providers:  GravelBags.it This test is not yet ap proved or cleared by the [place] FDA and  has been authorized for detection and/or diagnosis of SARS-CoV-2 by FDA under an Emergency Use Authorization (EUA). This EUA will remain  in effect (meaning this test can be used) for the duration of the COVID-19 declaration under Section 564(b)(1) of the Act, 21 U.S.C. section 360bbb-3(b)(1), unless the authorization is terminated or revoked sooner.    Influenza A by PCR NEGATIVE NEGATIVE Final   Influenza B by PCR NEGATIVE NEGATIVE Final    Comment: (NOTE) The Xpert Xpress SARS-CoV-2/FLU/RSV assay is intended as an aid in  the diagnosis of influenza from Nasopharyngeal swab specimens and  should not be used as a sole basis for treatment. Nasal washings and  aspirates are unacceptable for Xpert Xpress SARS-CoV-2/FLU/RSV  testing. Fact Sheet for Patients: PinkCheek.be Fact Sheet for Healthcare Providers: GravelBags.it This test is not yet approved or cleared by the [place] FDA and  has been authorized for detection and/or diagnosis of SARS-CoV-2 by  FDA under an Emergency Use Authorization (EUA). This EUA will remain  in effect (meaning this test can be used) for the duration of the  Covid-19 declaration under Section 564(b)(1) of the Act, 21  U.S.C. section 360bbb-3(b)(1), unless the authorization is  terminated or revoked. Performed at [facility], [place] [address] [place], [place] [address]   Culture,  Urine     Status: Abnormal   Collection Time: 07/09/19 12:12 PM   Specimen: Urine, Random  Result Value Ref Range Status   Specimen Description URINE, RANDOM  Final   Special Requests NONE  Final   Culture (A)  Final    <10,000 COLONIES/mL INSIGNIFICANT GROWTH Performed at [place] [address], [place], [place] [address]    Report Status 07/10/2019 FINAL  Final         Radiology Studies: No results found.      Scheduled Meds: . apixaban  10 mg Oral BID   Followed by  . [START ON 07/17/2019] apixaban  5 mg Oral BID  . atorvastatin  40 mg Oral Daily  . feeding supplement (ENSURE ENLIVE)  237 mL Oral TID BM  . insulin aspart  0-15 Units Subcutaneous TID WC  . insulin aspart  0-5 Units Subcutaneous QHS  . insulin glargine  5 Units Subcutaneous Daily  . isosorbide mononitrate  15 mg Oral Daily  . latanoprost  1 drop Both Eyes QHS  . metoprolol tartrate  12.5 mg Oral BID  . multivitamin with minerals  1 tablet Oral Daily  . ondansetron (ZOFRAN) IV  4 mg Intravenous Q6H  . pantoprazole (PROTONIX) IV  40 mg Intravenous Q12H  . polyethylene glycol  17 g Oral Daily  . senna-docusate  1 tablet Oral BID  . SUMAtriptan  100 mg Oral Once   Continuous Infusions:         Aline August, MD [facility] 07/16/2019, 10:34 AM

## 2019-07-17 DIAGNOSIS — R110 Nausea: Secondary | ICD-10-CM

## 2019-07-17 LAB — CBC WITH DIFFERENTIAL/PLATELET
Abs Immature Granulocytes: 0.05 10*3/uL (ref 0.00–0.07)
Basophils Absolute: 0.1 10*3/uL (ref 0.0–0.1)
Basophils Relative: 2 %
Eosinophils Absolute: 0.3 10*3/uL (ref 0.0–0.5)
Eosinophils Relative: 6 %
HCT: 37.9 % (ref 36.0–46.0)
Hemoglobin: 12.4 g/dL (ref 12.0–15.0)
Immature Granulocytes: 1 %
Lymphocytes Relative: 27 %
Lymphs Abs: 1.4 10*3/uL (ref 0.7–4.0)
MCH: 29.6 pg (ref 26.0–34.0)
MCHC: 32.7 g/dL (ref 30.0–36.0)
MCV: 90.5 fL (ref 80.0–100.0)
Monocytes Absolute: 0.5 10*3/uL (ref 0.1–1.0)
Monocytes Relative: 9 %
Neutro Abs: 3 10*3/uL (ref 1.7–7.7)
Neutrophils Relative %: 55 %
Platelets: 266 10*3/uL (ref 150–400)
RBC: 4.19 MIL/uL (ref 3.87–5.11)
RDW: 13.1 % (ref 11.5–15.5)
WBC: 5.4 10*3/uL (ref 4.0–10.5)
nRBC: 0 % (ref 0.0–0.2)

## 2019-07-17 LAB — COMPREHENSIVE METABOLIC PANEL
ALT: 20 U/L (ref 0–44)
AST: 21 U/L (ref 15–41)
Albumin: 2.8 g/dL — ABNORMAL LOW (ref 3.5–5.0)
Alkaline Phosphatase: 69 U/L (ref 38–126)
Anion gap: 9 (ref 5–15)
BUN: 10 mg/dL (ref 8–23)
CO2: 26 mmol/L (ref 22–32)
Calcium: 8.7 mg/dL — ABNORMAL LOW (ref 8.9–10.3)
Chloride: 104 mmol/L (ref 98–111)
Creatinine, Ser: 1.51 mg/dL — ABNORMAL HIGH (ref 0.44–1.00)
GFR calc Af Amer: 39 mL/min — ABNORMAL LOW (ref >60)
GFR calc non Af Amer: 33 mL/min — ABNORMAL LOW (ref >60)
Glucose, Bld: 113 mg/dL — ABNORMAL HIGH (ref 70–99)
Potassium: 4.4 mmol/L (ref 3.5–5.1)
Sodium: 139 mmol/L (ref 135–145)
Total Bilirubin: 0.7 mg/dL (ref 0.3–1.2)
Total Protein: 5.7 g/dL — ABNORMAL LOW (ref 6.5–8.1)

## 2019-07-17 LAB — GLUCOSE, CAPILLARY
Glucose-Capillary: 101 mg/dL — ABNORMAL HIGH (ref 70–99)
Glucose-Capillary: 151 mg/dL — ABNORMAL HIGH (ref 70–99)
Glucose-Capillary: 99 mg/dL (ref 70–99)
Glucose-Capillary: 108 mg/dL — ABNORMAL HIGH (ref 70–99)

## 2019-07-17 LAB — MAGNESIUM: Magnesium: 1.9 mg/dL (ref 1.7–2.4)

## 2019-07-17 MED ORDER — ALUM & MAG HYDROXIDE-SIMETH 200-200-20 MG/5ML PO SUSP: 30.0000 mL | ORAL | Status: DC | PRN

## 2019-07-17 MED ORDER — SUMATRIPTAN SUCCINATE 100 MG PO TABS
100.0000 mg | ORAL_TABLET | Freq: Once | ORAL | Status: AC
  Administered 2019-07-17: 100 mg via ORAL
  Filled 2019-07-17: qty 1

## 2019-07-17 MED ORDER — FAMOTIDINE 20 MG PO TABS
20.0000 mg | ORAL_TABLET | Freq: Two times a day (BID) | ORAL | Status: DC
  Administered 2019-07-17 – 2019-07-23 (×12): 20 mg via ORAL
  Filled 2019-07-17 (×12): qty 1

## 2019-07-17 MED ORDER — SUCRALFATE 1 GM/10ML PO SUSP
1.0000 g | Freq: Three times a day (TID) | ORAL | Status: DC
  Administered 2019-07-17 – 2019-07-18 (×5): 1 g via ORAL
  Filled 2019-07-17 (×10): qty 10

## 2019-07-17 MED ORDER — CLOPIDOGREL BISULFATE 75 MG PO TABS
75.0000 mg | ORAL_TABLET | Freq: Every day | ORAL | Status: DC
  Administered 2019-07-17 – 2019-07-23 (×7): 75 mg via ORAL
  Filled 2019-07-17 (×7): qty 1

## 2019-07-17 NOTE — Progress Notes (Signed)
Patient ID: Maria Fox, female   DOB: 1945-05-28, 74 y.o.   MRN: [id number]  PROGRESS NOTE    Maria Fox  [name] DOB: [birth year] DOA: 07/06/2019 PCP: [name], MD   Brief Narrative:  74 year old female with HTN, HLD, DM, history of CVA, CAD status post CABG, came into the hospital with abdominal pain, mainly on the right side which started about 1 to 2 weeks ago.  She was diagnosed with acute cholecystitis and started on IV antibiotics.  She underwent lap chole on 06/29/2019.  Postsurgical course was complicated by atrial flutter, chest pain, respite distress and acute DVT/presumed PE.  She was treated with heparin and was subsequently switched to oral Eliquis.  Cardiology was also consulted and patient was started on IV amiodarone.   Assessment & Plan:   Acute cholecystitis Postoperative nausea -status post laparoscopic cholecystectomy on 07/07/19.  Appreciate surgery follow-up, seems to be recovering well -Still has intermittent nausea with poor oral intake. -Despite being on around-the-clock Zofran and twice a day Protonix, she still feels that she is very nauseous.  Her LFTs are still normal.  CT of the abdomen on 07/16/2019 with p.o. contrast showed findings concerning for of?  Early tip appendicitis.  CT of the abdomen was then reviewed by general surgery/Dr. Donne Hazel who thought that it was not appendicitis. -Continue as needed Phenergan.  Consulted GI for the same.  Headache -Complains of intermittent headache with nausea.  Continue Excedrin. -CT of the head without contrast was negative for acute abnormality.  Acute DVT, presumed pulmonary embolism with acute hypoxic respiratory failure -patient with chest pain and new onset a flutter on 07/10/19.  D-dimer was elevated and she was empirically started on heparin out of concern for PE.  Lower extremity Dopplers did show an acute DVT.  CT angiogram was initially ordered however she does have an allergy to IV contrast and  borderline renal function, and given acute DVT I would presume she also has a PE and CT scan was canceled.  2D echo did not show any RV strain, normal LVEF -Tolerated heparin well, hemoglobin is stable and she was started on Eliquis on 07/12/19.    A flutter/chest pain -cardiology consult appreciated, she was started on amiodarone and now she is in sinus rhythm.  Continue beta-blockers, cardiology recommending IV amiodarone up until discharge per their note.  They signed off and set up outpatient follow-up appointment -Because of intermittent nausea/vomiting, I discontinued IV amiodarone on 07/14/2019.  Heart rate currently stable. -Outpatient follow-up with cardiology.  Acute hypoxic respiratory failure -Incentive spirometry.  Patient received a dose of IV Lasix on 07/14/2019.   -Currently on room air.  Acute kidney injury on chronic kidney disease stage IIIa -creatinine overall stable, did increase slightly after Lasix when she was in respiratory distress on 5/2 -Creatinine slightly bumped up at 1.61 today.  She did receive a dose of Lasix again on 07/14/2019.  Hold off on Lasix.  Monitor creatinine.  Essential hypertension-continue metoprolol  Type 2 diabetes mellitus -CBGs with with sliding scale.  Continue Lantus  Hyperlipidemia-continue atorvastatin  Coronary artery disease, history of chronic diastolic CHF, history of CABG -Currently stable. Troponins flat     DVT prophylaxis: Eliquis Code Status: Full Family Communication: Patient at bedside Disposition Plan: Status is: Inpatient  Remains inpatient appropriate because: Still intermittently nauseous with poor oral intake and with intermittent headaches.  Does not feel ready to go home yet today.  Will consult GI.  Hopefully discharge in 1 to  2 days once her nausea and headache is improved.  Dispo: The patient is from: Home              Anticipated d/c is to: Home              Anticipated d/c date is: 1 day               Patient currently is not medically stable to d/c.    Consultants: General surgery/cardiology  Procedures: Laparoscopic cholecystectomy on 07/07/2019  Antimicrobials:  Anti-infectives (From admission, onward)   Start     Dose/Rate Route Frequency Ordered Stop   07/08/19 0500  cefTRIAXone (ROCEPHIN) 2 g in sodium chloride 0.9 % 100 mL IVPB  Status:  Discontinued     2 g 200 mL/hr over 30 Minutes Intravenous Every 24 hours 07/07/19 0839 07/07/19 1802   07/07/19 0245  cefTRIAXone (ROCEPHIN) 2 g in sodium chloride 0.9 % 100 mL IVPB     2 g 200 mL/hr over 30 Minutes Intravenous  Once 07/07/19 0231 07/07/19 0451       Subjective: Patient seen and examined at bedside.  Still feels the same and complains of intermittent nausea with poor oral intake.  Also has some intermittent headaches.  No overnight fever or vomiting. Objective: Vitals:   07/17/19 0320 07/17/19 0839 07/17/19 0903 07/17/19 0904  BP: 137/80 99/88 138/75 138/75  Pulse: 65 63 63 68  Resp: 15 17 13    Temp: 97.8 F (36.6 C) 97.6 F (36.4 C)    TempSrc: Oral Oral    SpO2: 93% 93% 92%   Weight:      Height:        Intake/Output Summary (Last 24 hours) at 07/17/2019 0948 Last data filed at 07/17/2019 0800 Gross per 24 hour  Intake 600 ml  Output 2325 ml  Net -1725 ml   Filed Weights   07/06/19 1842 07/10/19 1600 07/16/19 0422  Weight: 61.2 kg 71.7 kg 68.9 kg    Examination:  General exam: No acute distress.  Chronically ill looking. Respiratory system: Bilateral decreased breath sounds at bases with scattered crackles  cardiovascular system: S1 & S2 heard; rate controlled  gastrointestinal system: Abdomen is nondistended, soft and nontender.  Incisions are clean and dry.  Normal bowel sounds heard Extremities: Mild lower extremity edema present.  No cyanosis Central nervous system: Alert and awake; poor historian.  No focal neurological deficits. Moving extremities Skin: No obvious lesions or rashes Psychiatry:  Anxious looking   Data Reviewed: I have personally reviewed following labs and imaging studies  CBC: Recent Labs  Lab 07/13/19 0341 07/14/19 0351 07/15/19 0237 07/16/19 0310 07/17/19 0436  WBC 6.1 5.6 5.9 6.2 5.4  NEUTROABS  --   --  3.3 3.2 3.0  HGB 11.2* 11.8* 12.8 12.3 12.4  HCT 34.9* 35.9* 38.1 37.1 37.9  MCV 92.8 91.1 90.1 90.5 90.5  PLT 201 203 228 242 [id number]   Basic Metabolic Panel: Recent Labs  Lab 07/12/19 0302 07/12/19 0302 07/13/19 0341 07/14/19 0351 07/15/19 0237 07/16/19 0310 07/17/19 0436  NA 142   < > 139 140 136 136 139  K 4.0   < > 4.0 4.0 3.9 4.1 4.4  CL 111   < > 107 107 102 100 104  CO2 21*   < > 21* 23 27 26 26   GLUCOSE 141*   < > 113* 147* 123* 138* 113*  BUN 9   < > 8 8 7* 12 10  CREATININE 1.54*   < >  1.24* 1.17* 1.42* 1.61* 1.51*  CALCIUM 8.7*   < > 8.6* 8.8* 8.7* 8.5* 8.7*  MG 1.7  --   --  1.7 1.8 1.7 1.9   < > = values in this interval not displayed.   GFR: Estimated Creatinine Clearance: 28.8 mL/min (A) (by C-G formula based on SCr of 1.51 mg/dL (H)). Liver Function Tests: Recent Labs  Lab 07/12/19 0302 07/13/19 0341 07/15/19 0237 07/16/19 0310 07/17/19 0436  AST 16 22 28 22 21   ALT 17 18 27 22 20   ALKPHOS 65 56 75 71 69  BILITOT 0.2* 0.6 0.5 0.6 0.7  PROT 5.1* 4.8* 6.0* 5.7* 5.7*  ALBUMIN 2.5* 2.4* 3.1* 2.9* 2.8*   Recent Labs  Lab 07/15/19 0237  LIPASE 18   No results for input(s): AMMONIA in the last 168 hours. Coagulation Profile: No results for input(s): INR, PROTIME in the last 168 hours. Cardiac Enzymes: No results for input(s): CKTOTAL, CKMB, CKMBINDEX, TROPONINI in the last 168 hours. BNP (last 3 results) No results for input(s): PROBNP in the last 8760 hours. HbA1C: No results for input(s): HGBA1C in the last 72 hours. CBG: Recent Labs  Lab 07/16/19 0618 07/16/19 1119 07/16/19 1636 07/16/19 2150 07/17/19 0634  GLUCAP 138* 132* 207* 77 101*   Lipid Profile: No results for input(s): CHOL, HDL,  LDLCALC, TRIG, CHOLHDL, LDLDIRECT in the last 72 hours. Thyroid Function Tests: No results for input(s): TSH, T4TOTAL, FREET4, T3FREE, THYROIDAB in the last 72 hours. Anemia Panel: No results for input(s): VITAMINB12, FOLATE, FERRITIN, TIBC, IRON, RETICCTPCT in the last 72 hours. Sepsis Labs: No results for input(s): PROCALCITON, LATICACIDVEN in the last 168 hours.  Recent Results (from the past 240 hour(s))  Culture, Urine     Status: Abnormal   Collection Time: 07/09/19 12:12 PM   Specimen: Urine, Random  Result Value Ref Range Status   Specimen Description URINE, RANDOM  Final   Special Requests NONE  Final   Culture (A)  Final    <10,000 COLONIES/mL INSIGNIFICANT GROWTH Performed at [facility], [address], [place], [place] [address]    Report Status 07/10/2019 FINAL  Final         Radiology Studies: CT ABDOMEN WO CONTRAST  Result Date: 07/16/2019 CLINICAL DATA:  Abdominal pain, nausea, vomiting since gallbladder removal on 07/07/2019 EXAM: CT ABDOMEN WITHOUT CONTRAST TECHNIQUE: Multidetector CT imaging of the abdomen was performed following the standard protocol without IV contrast. COMPARISON:  07/07/2019 FINDINGS: Lower chest: Small bilateral pleural effusions. Bibasilar atelectasis. Hepatobiliary: Prior cholecystectomy. Postsurgical changes in the gallbladder fossa from recent cholecystectomy. Low-attenuation of the liver as can be seen with hepatic steatosis. Focal fatty sparing around the gallbladder fossa. No intrahepatic or extrahepatic biliary ductal dilatation. Pancreas: Unremarkable. No pancreatic ductal dilatation or surrounding inflammatory changes. Spleen: Normal size. Multiple dystrophic calcifications within the spleen as can be seen with prior granulomatous disease. Adrenals/Urinary Tract: Adrenal glands are unremarkable. Kidneys are normal, without renal calculi, focal lesion, or hydronephrosis. Stomach/Bowel: Stomach is within normal limits. Slight  thickening of the tip of the appendix compared with the prior examination of 07/07/2019 measuring 6 mm in diameter in with mild surrounding inflammatory changes likely reflecting reactive inflammatory changes from recent cholecystectomy adjacent to the appendix versus early tip appendicitis which is considered less likely given normal WBC earlier this morning. No evidence of bowel wall thickening, distention, or inflammatory changes. Moderate amount of stool in the ascending and transverse colon. Vascular/Lymphatic: Normal caliber abdominal aorta with mild atherosclerosis.  No lymphadenopathy. Other: No fluid collection or hematoma. Musculoskeletal: No acute osseous abnormality. No aggressive osseous lesion. IMPRESSION: 1. Slight thickening of the tip of the appendix compared with the prior examination of 07/07/2019 measuring 6 mm in diameter in with mild surrounding inflammatory changes likely reflecting reactive inflammatory changes from recent cholecystectomy adjacent to the appendix versus early tip appendicitis which is considered less likely given normal WBC earlier this morning. Results from most recent CBC are not available. Recommend correlation with these results and clinical exam. 2. Postsurgical changes in the gallbladder fossa from recent cholecystectomy. 3. Hepatic steatosis. 4. Small bilateral pleural effusions. 5. Aortic Atherosclerosis (ICD10-I70.0). Electronically Signed   By: Kathreen Devoid   On: 07/16/2019 13:10   CT HEAD WO CONTRAST  Result Date: 07/16/2019 CLINICAL DATA:  Migraine, headache, nausea and vomiting since gallbladder removal 07/07/2019. EXAM: CT HEAD WITHOUT CONTRAST TECHNIQUE: Contiguous axial images were obtained from the base of the skull through the vertex without intravenous contrast. COMPARISON:  Head CT 11/02/2015. FINDINGS: Brain: No evidence of acute infarction, hemorrhage, hydrocephalus, extra-axial collection or mass lesion/mass effect. Cortical atrophy again seen.  Vascular: Atherosclerosis. Skull: Intact.  No focal lesion. Sinuses/Orbits: Status post cataract surgery.  Otherwise negative. Other: None. IMPRESSION: No acute abnormality. Cortical atrophy. Atherosclerosis. Electronically Signed   By: Inge Rise M.D.   On: 07/16/2019 13:04        Scheduled Meds:  apixaban  5 mg Oral BID   atorvastatin  40 mg Oral Daily   feeding supplement (ENSURE ENLIVE)  237 mL Oral TID BM   insulin aspart  0-15 Units Subcutaneous TID WC   insulin aspart  0-5 Units Subcutaneous QHS   insulin glargine  5 Units Subcutaneous Daily   isosorbide mononitrate  15 mg Oral Daily   latanoprost  1 drop Both Eyes QHS   metoprolol tartrate  12.5 mg Oral BID   multivitamin with minerals  1 tablet Oral Daily   ondansetron (ZOFRAN) IV  4 mg Intravenous Q6H   pantoprazole (PROTONIX) IV  40 mg Intravenous Q12H   polyethylene glycol  17 g Oral Daily   senna-docusate  1 tablet Oral BID   Continuous Infusions:         Aline August, MD [facility] 07/17/2019, 9:48 AM
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
## 2019-07-18 ENCOUNTER — Inpatient Hospital Stay (HOSPITAL_COMMUNITY): Payer: Medicare HMO

## 2019-07-18 DIAGNOSIS — G43711 Chronic migraine without aura, intractable, with status migrainosus: Secondary | ICD-10-CM

## 2019-07-18 LAB — BASIC METABOLIC PANEL
Anion gap: 9 (ref 5–15)
BUN: 8 mg/dL (ref 8–23)
CO2: 23 mmol/L (ref 22–32)
Calcium: 9.1 mg/dL (ref 8.9–10.3)
Chloride: 105 mmol/L (ref 98–111)
Creatinine, Ser: 1.43 mg/dL — ABNORMAL HIGH (ref 0.44–1.00)
GFR calc Af Amer: 41 mL/min — ABNORMAL LOW (ref >60)
GFR calc non Af Amer: 35 mL/min — ABNORMAL LOW (ref >60)
Glucose, Bld: 121 mg/dL — ABNORMAL HIGH (ref 70–99)
Potassium: 5.5 mmol/L — ABNORMAL HIGH (ref 3.5–5.1)
Sodium: 137 mmol/L (ref 135–145)

## 2019-07-18 LAB — CBC WITH DIFFERENTIAL/PLATELET
Abs Immature Granulocytes: 0.05 10*3/uL (ref 0.00–0.07)
Basophils Absolute: 0.1 10*3/uL (ref 0.0–0.1)
Basophils Relative: 1 %
Eosinophils Absolute: 0.4 10*3/uL (ref 0.0–0.5)
Eosinophils Relative: 6 %
HCT: 39.5 % (ref 36.0–46.0)
Hemoglobin: 12.9 g/dL (ref 12.0–15.0)
Immature Granulocytes: 1 %
Lymphocytes Relative: 28 %
Lymphs Abs: 1.7 10*3/uL (ref 0.7–4.0)
MCH: 29.9 pg (ref 26.0–34.0)
MCHC: 32.7 g/dL (ref 30.0–36.0)
MCV: 91.6 fL (ref 80.0–100.0)
Monocytes Absolute: 0.6 10*3/uL (ref 0.1–1.0)
Monocytes Relative: 9 %
Neutro Abs: 3.5 10*3/uL (ref 1.7–7.7)
Neutrophils Relative %: 55 %
Platelets: 255 10*3/uL (ref 150–400)
RBC: 4.31 MIL/uL (ref 3.87–5.11)
RDW: 13.2 % (ref 11.5–15.5)
WBC: 6.3 10*3/uL (ref 4.0–10.5)
nRBC: 0 % (ref 0.0–0.2)

## 2019-07-18 LAB — MAGNESIUM: Magnesium: 2 mg/dL (ref 1.7–2.4)

## 2019-07-18 LAB — GLUCOSE, CAPILLARY
Glucose-Capillary: 132 mg/dL — ABNORMAL HIGH (ref 70–99)
Glucose-Capillary: 163 mg/dL — ABNORMAL HIGH (ref 70–99)
Glucose-Capillary: 114 mg/dL — ABNORMAL HIGH (ref 70–99)
Glucose-Capillary: 128 mg/dL — ABNORMAL HIGH (ref 70–99)

## 2019-07-18 MED ORDER — PROMETHAZINE HCL 25 MG/ML IJ SOLN: 12.5000 mg | Freq: Once | INTRAMUSCULAR | Status: DC

## 2019-07-18 MED ORDER — PROMETHAZINE HCL 25 MG/ML IJ SOLN
12.5000 mg | Freq: Once | INTRAMUSCULAR | Status: AC
  Administered 2019-07-18: 12.5 mg via INTRAVENOUS
  Filled 2019-07-18: qty 1

## 2019-07-18 MED ORDER — GADOBUTROL 1 MMOL/ML IV SOLN
6.5000 mL | Freq: Once | INTRAVENOUS | Status: AC | PRN
  Administered 2019-07-18: 6.5 mL via INTRAVENOUS
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
## 2019-07-18 NOTE — Progress Notes (Signed)
[facility] Progress Note  CC:  Nausea   Subjective:  She continues to have a persistent headache. Nausea is a little less today. She just returned from having brain MRI, results pending. Neuro NP at bedside for consult.    Objective:  Vital signs in last 24 hours: Temp:  [97.4 F (36.3 C)-97.9 F (36.6 C)] 97.7 F (36.5 C) (05/09 1143) Pulse Rate:  [56-66] 66 (05/09 1143) Resp:  [2-33] 14 (05/09 1143) BP: (125-155)/(63-86) 125/86 (05/09 1143) SpO2:  [91 %-96 %] 95 % (05/09 1143) Weight:  [67.9 kg] 67.9 kg (05/09 0530) Last BM Date: 07/15/19 General:   Alert 74 year old, wash cloth over forehead, lights in room dimmed.  Heart:  RRR, no murmur.   Pulm:  Breath sounds clear throughout.  Abdomen: Soft, nontender, nondistended. Laparoscopic scars intact. + BS x 4 quads.  Extremities:  Without edema. Neurologic:  Alert and  oriented x4;  grossly normal neurologically. Psych:  Alert and cooperative. Normal mood and affect.  Intake/Output from previous day: 05/08 0701 - 05/09 0700 In: 240 [P.O.:240] Out: 2225 [Urine:2225] Intake/Output this shift: Total I/O In: 240 [P.O.:240] Out: -   Lab Results: Recent Labs    07/16/19 0310 07/17/19 0436 07/18/19 0228  WBC 6.2 5.4 6.3  HGB 12.3 12.4 12.9  HCT 37.1 37.9 39.5  PLT 242 266 255   BMET Recent Labs    07/16/19 0310 07/17/19 0436 07/18/19 0228  NA 136 139 137  K 4.1 4.4 5.5*  CL 100 104 105  CO2 26 26 23   GLUCOSE 138* 113* 121*  BUN 12 10 8   CREATININE 1.61* 1.51* 1.43*  CALCIUM 8.5* 8.7* 9.1   LFT Recent Labs    07/17/19 0436  PROT 5.7*  ALBUMIN 2.8*  AST 21  ALT 20  ALKPHOS 69  BILITOT 0.7   PT/INR No results for input(s): LABPROT, INR in the last 72 hours. Hepatitis Panel No results for input(s): HEPBSAG, HCVAB, HEPAIGM, HEPBIGM in the last 72 hours.  No results found.  Assessment / Plan:  70.  74 year old female s/p laparoscopic cholecystectomy for acute cholecystitis on  07/07/2019.  Postoperative course was complicated by the development of atrial flutter, DVT and presumed PE, nausea and frontal headaches.  She is now on Eliquis, Plavix and ASA. CT of the abdomen and pelvis 5/7 showed slight thickening to the tip of the appendix assessed to be reactive inflammatory changes from her recent cholecystectomy and less likely acute appendicitis.  CT of the head without acute abnormalities.  EGD 03/2019 showed gastric mucosal atrophy otherwise was normal. Etiology for her nausea remains unclear neuro vs GI vs medication related? She has received Imitrex and Excedrin.  Amiodarone dc'd on 5/5. Nausea slightly improved today.  Neuro consulted, brain MRI results pending.  -No plans for endoscopic evaluation  -Continu Pantoprazole 40 mg twice daily, Famotidine 20mg  po bid and Carafate 1gm po qid -Ondansetron 4 mg p.o. or IV every 6 hours.  Phenergan 25mg  po Q 6 hours PRN -Diet as tolerated   2. History of CVA  3. History of CAD, MI, s/p CABG in 2016  4. DM II  5. Hyperkalemia. K+ 5.5 -Management per the hospitalist     Principal Problem:   Acute cholecystitis Active Problems:   Hypertension, essential   Hyperlipidemia associated with type 2 diabetes mellitus ([name])   DM (diabetes mellitus), type 2, uncontrolled w/neurologic complication ([name])   Chronic diastolic CHF (congestive heart failure) ([place])   Stage  3 chronic kidney disease due to type 2 diabetes mellitus ([place])   Coronary artery disease of native artery of native heart with stable angina pectoris ([name])   Atrial flutter with rapid ventricular response ([place])     LOS: 11 days   Maria Fox  07/18/2019, 12:53 PM

## 2019-07-18 NOTE — Progress Notes (Addendum)
Patient ID: Maria Fox, female   DOB: 08/12/1945, 74 y.o.   MRN: [id number]  PROGRESS NOTE    Maria Fox  [id number] DOB: [birth year] DOA: 07/06/2019 PCP: Maria Bush, MD   Brief Narrative:  74 year old female with HTN, HLD, DM, history of CVA, CAD status post CABG, came into the hospital with abdominal pain, mainly on the right side which started about 1 to 2 weeks ago.  She was diagnosed with acute cholecystitis and started on IV antibiotics.  She underwent lap chole on 06/29/2019.  Postsurgical course was complicated by atrial flutter, chest pain, respite distress and acute DVT/presumed PE.  She was treated with heparin and was subsequently switched to oral Eliquis.  Cardiology was also consulted and patient was started on IV amiodarone.   Assessment & Plan:   Acute cholecystitis Postoperative nausea -status post laparoscopic cholecystectomy on 07/07/19.  Appreciate surgery follow-up, seems to be recovering well -Still has intermittent nausea with poor oral intake. -Despite being on around-the-clock Zofran and twice a day Protonix, she still feels that she is very nauseous.  Her LFTs are still normal.  CT of the abdomen on 07/16/2019 with p.o. contrast showed findings concerning for ?  Early tip appendicitis.  CT of the abdomen was then reviewed by general surgery/Dr. Donne Fox who thought that it was not appendicitis. -Continue as needed Phenergan.  GI consulted on 07/17/2019: Carafate added.  Continue around-the-clock Zofran and will add round-the-clock Reglan as well for the next 24 hours as per GI recommendation.  Check QTC in a.m.  Headache -Still complains of intermittent nausea with bad headaches and feels that she is drunk.  Continue Excedrin as needed along with Phenergan as needed.  Patient received Imitrex 1 dose each for 2 days with no improvement.  -CT of the head without contrast was negative for acute abnormality. -MRI of the brain with contrast did not show any acute  infarct.  Patient received a dose of IV Phenergan yesterday.  We will give IV magnesium sulfate and IV fluids as per neurology recommendations. -We will also add oral opiate as needed in hopes to improve the headache.   Acute DVT, presumed pulmonary embolism with acute hypoxic respiratory failure -patient with chest pain and new onset a flutter on 07/10/19.  D-dimer was elevated and she was empirically started on heparin out of concern for PE.  Lower extremity Dopplers did show an acute DVT.  CT angiogram was initially ordered however she does have an allergy to IV contrast and borderline renal function, and given acute DVT I would presume she also has a PE and CT scan was canceled.  2D echo did not show any RV strain, normal LVEF -Tolerated heparin well, hemoglobin is stable and she was started on Eliquis on 07/12/19.    A flutter/chest pain -cardiology consult appreciated, she was started on amiodarone and now she is in sinus rhythm.  Continue beta-blockers, cardiology recommending IV amiodarone up until discharge per their note.  They signed off and set up outpatient follow-up appointment -Because of intermittent nausea/vomiting, I discontinued IV amiodarone on 07/14/2019.  Heart rate currently stable. -Outpatient follow-up with cardiology.  Acute hypoxic respiratory failure -Incentive spirometry.  Patient received a dose of IV Lasix on 07/14/2019.   -Currently on room air.  Acute kidney injury on chronic kidney disease stage IIIa -creatinine overall stable, did increase slightly after Lasix when she was in respiratory distress on 5/2 -Creatinine 1.68  today.  Monitor.  IV fluid plan as above.  Essential hypertension-continue metoprolol  Type 2 diabetes mellitus -CBGs with with sliding scale.  Continue Lantus  Hyperlipidemia-continue atorvastatin  Coronary artery disease, history of chronic diastolic CHF, history of CABG -Currently stable. Troponins flat     DVT prophylaxis:  Eliquis Code Status: Full Family Communication: Patient at bedside Disposition Plan: Status is: Inpatient  Remains inpatient appropriate because: Still intermittently nauseous with poor oral intake and with intermittent headaches.  Does not feel ready to go home yet today.  Will consult GI.  Hopefully discharge in 1 to 2 days once her nausea and headache is improved.  Dispo: The patient is from: Home              Anticipated d/c is to: Home              Anticipated d/c date is: 1 day              Patient currently is not medically stable to d/c.    Consultants: General surgery/cardiology/GI/neurology  Procedures: Laparoscopic cholecystectomy on 07/07/2019  Antimicrobials:  Anti-infectives (From admission, onward)   Start     Dose/Rate Route Frequency Ordered Stop   07/08/19 0500  cefTRIAXone (ROCEPHIN) 2 g in sodium chloride 0.9 % 100 mL IVPB  Status:  Discontinued     2 g 200 mL/hr over 30 Minutes Intravenous Every 24 hours 07/07/19 0839 07/07/19 1802   07/07/19 0245  cefTRIAXone (ROCEPHIN) 2 g in sodium chloride 0.9 % 100 mL IVPB     2 g 200 mL/hr over 30 Minutes Intravenous  Once 07/07/19 0231 07/07/19 0451       Subjective: Patient seen and examined at bedside.  Still feels the same.  Complains of constant headache with intermittent nausea.  Her oral intake is very poor.  No overnight fevers. Objective: Vitals:   07/17/19 2002 07/18/19 0032 07/18/19 0530 07/18/19 0900  BP: 136/77 127/71 133/68 135/63  Pulse: 65 61 62 66  Resp: 15 16 (!) 33 (!) 2  Temp: (!) 97.4 F (36.3 C) 97.9 F (36.6 C) 97.8 F (36.6 C) (!) 97.4 F (36.3 C)  TempSrc: Oral Oral Oral Oral  SpO2: 93% 96% 91% 96%  Weight:   67.9 kg   Height:        Intake/Output Summary (Last 24 hours) at 07/18/2019 0919 Last data filed at 07/17/2019 1900 Gross per 24 hour  Intake 240 ml  Output 1800 ml  Net -1560 ml   Filed Weights   07/10/19 1600 07/16/19 0422 07/18/19 0530  Weight: 71.7 kg 68.9 kg 67.9 kg     Examination:  General exam: Still in some distress secondary to headache.  Chronically ill looking. Respiratory system: Bilateral decreased breath sounds at bases; intermittent tachypnea.   Cardiovascular system: S1-S2 heard, rate controlled  gastrointestinal system: Abdomen is nondistended, soft and nontender.  Incisions are clean and dry.  Normal bowel sounds heard Extremities: Mild lower extremity edema present.  No clubbing  central nervous system: Alert and awake.  Poor historian.  No focal neurological deficits. Moving extremities Skin: No petechiae or ulcers Psychiatry: Anxious looking   Data Reviewed: I have personally reviewed following labs and imaging studies  CBC: Recent Labs  Lab 07/14/19 0351 07/15/19 0237 07/16/19 0310 07/17/19 0436 07/18/19 0228  WBC 5.6 5.9 6.2 5.4 6.3  NEUTROABS  --  3.3 3.2 3.0 3.5  HGB 11.8* 12.8 12.3 12.4 12.9  HCT 35.9* 38.1 37.1 37.9 39.5  MCV 91.1 90.1 90.5 90.5 91.6  PLT 203 228  242 266 [id number]   Basic Metabolic Panel: Recent Labs  Lab 07/14/19 0351 07/15/19 0237 07/16/19 0310 07/17/19 0436 07/18/19 0228  NA 140 136 136 139 137  K 4.0 3.9 4.1 4.4 5.5*  CL 107 102 100 104 105  CO2 23 27 26 26 23   GLUCOSE 147* 123* 138* 113* 121*  BUN 8 7* 12 10 8   CREATININE 1.17* 1.42* 1.61* 1.51* 1.43*  CALCIUM 8.8* 8.7* 8.5* 8.7* 9.1  MG 1.7 1.8 1.7 1.9 2.0   GFR: Estimated Creatinine Clearance: 30.2 mL/min (A) (by C-G formula based on SCr of 1.43 mg/dL (H)). Liver Function Tests: Recent Labs  Lab 07/12/19 0302 07/13/19 0341 07/15/19 0237 07/16/19 0310 07/17/19 0436  AST 16 22 28 22 21   ALT 17 18 27 22 20   ALKPHOS 65 56 75 71 69  BILITOT 0.2* 0.6 0.5 0.6 0.7  PROT 5.1* 4.8* 6.0* 5.7* 5.7*  ALBUMIN 2.5* 2.4* 3.1* 2.9* 2.8*   Recent Labs  Lab 07/15/19 0237  LIPASE 18   No results for input(s): AMMONIA in the last 168 hours. Coagulation Profile: No results for input(s): INR, PROTIME in the last 168 hours. Cardiac  Enzymes: No results for input(s): CKTOTAL, CKMB, CKMBINDEX, TROPONINI in the last 168 hours. BNP (last 3 results) No results for input(s): PROBNP in the last 8760 hours. HbA1C: No results for input(s): HGBA1C in the last 72 hours. CBG: Recent Labs  Lab 07/17/19 0634 07/17/19 1204 07/17/19 1548 07/17/19 2151 07/18/19 0632  GLUCAP 101* 151* 99 108* 132*   Lipid Profile: No results for input(s): CHOL, HDL, LDLCALC, TRIG, CHOLHDL, LDLDIRECT in the last 72 hours. Thyroid Function Tests: No results for input(s): TSH, T4TOTAL, FREET4, T3FREE, THYROIDAB in the last 72 hours. Anemia Panel: No results for input(s): VITAMINB12, FOLATE, FERRITIN, TIBC, IRON, RETICCTPCT in the last 72 hours. Sepsis Labs: No results for input(s): PROCALCITON, LATICACIDVEN in the last 168 hours.  Recent Results (from the past 240 hour(s))  Culture, Urine     Status: Abnormal   Collection Time: 07/09/19 12:12 PM   Specimen: Urine, Random  Result Value Ref Range Status   Specimen Description URINE, RANDOM  Final   Special Requests NONE  Final   Culture (A)  Final    <10,000 COLONIES/mL INSIGNIFICANT GROWTH Performed at [facility], [address] [place], Maria Fox    Report Status 07/10/2019 FINAL  Final         Radiology Studies: CT ABDOMEN WO CONTRAST  Result Date: 07/16/2019 CLINICAL DATA:  Abdominal pain, nausea, vomiting since gallbladder removal on 07/07/2019 EXAM: CT ABDOMEN WITHOUT CONTRAST TECHNIQUE: Multidetector CT imaging of the abdomen was performed following the standard protocol without IV contrast. COMPARISON:  07/07/2019 FINDINGS: Lower chest: Small bilateral pleural effusions. Bibasilar atelectasis. Hepatobiliary: Prior cholecystectomy. Postsurgical changes in the gallbladder fossa from recent cholecystectomy. Low-attenuation of the liver as can be seen with hepatic steatosis. Focal fatty sparing around the gallbladder fossa. No intrahepatic or extrahepatic biliary  ductal dilatation. Pancreas: Unremarkable. No pancreatic ductal dilatation or surrounding inflammatory changes. Spleen: Normal size. Multiple dystrophic calcifications within the spleen as can be seen with prior granulomatous disease. Adrenals/Urinary Tract: Adrenal glands are unremarkable. Kidneys are normal, without renal calculi, focal lesion, or hydronephrosis. Stomach/Bowel: Stomach is within normal limits. Slight thickening of the tip of the appendix compared with the prior examination of 07/07/2019 measuring 6 mm in diameter in with mild surrounding inflammatory changes likely reflecting reactive inflammatory changes from recent cholecystectomy adjacent to the  appendix versus early tip appendicitis which is considered less likely given normal WBC earlier this morning. No evidence of bowel wall thickening, distention, or inflammatory changes. Moderate amount of stool in the ascending and transverse colon. Vascular/Lymphatic: Normal caliber abdominal aorta with mild atherosclerosis. No lymphadenopathy. Other: No fluid collection or hematoma. Musculoskeletal: No acute osseous abnormality. No aggressive osseous lesion. IMPRESSION: 1. Slight thickening of the tip of the appendix compared with the prior examination of 07/07/2019 measuring 6 mm in diameter in with mild surrounding inflammatory changes likely reflecting reactive inflammatory changes from recent cholecystectomy adjacent to the appendix versus early tip appendicitis which is considered less likely given normal WBC earlier this morning. Results from most recent CBC are not available. Recommend correlation with these results and clinical exam. 2. Postsurgical changes in the gallbladder fossa from recent cholecystectomy. 3. Hepatic steatosis. 4. Small bilateral pleural effusions. 5. Aortic Atherosclerosis (ICD10-I70.0). Electronically Signed   By: Kathreen Devoid   On: 07/16/2019 13:10   CT HEAD WO CONTRAST  Result Date: 07/16/2019 CLINICAL DATA:   Migraine, headache, nausea and vomiting since gallbladder removal 07/07/2019. EXAM: CT HEAD WITHOUT CONTRAST TECHNIQUE: Contiguous axial images were obtained from the base of the skull through the vertex without intravenous contrast. COMPARISON:  Head CT 11/02/2015. FINDINGS: Brain: No evidence of acute infarction, hemorrhage, hydrocephalus, extra-axial collection or mass lesion/mass effect. Cortical atrophy again seen. Vascular: Atherosclerosis. Skull: Intact.  No focal lesion. Sinuses/Orbits: Status post cataract surgery.  Otherwise negative. Other: None. IMPRESSION: No acute abnormality. Cortical atrophy. Atherosclerosis. Electronically Signed   By: Inge Rise M.D.   On: 07/16/2019 13:04        Scheduled Meds: . apixaban  5 mg Oral BID  . atorvastatin  40 mg Oral Daily  . clopidogrel  75 mg Oral Daily  . famotidine  20 mg Oral BID  . feeding supplement (ENSURE ENLIVE)  237 mL Oral TID BM  . insulin aspart  0-15 Units Subcutaneous TID WC  . insulin aspart  0-5 Units Subcutaneous QHS  . insulin glargine  5 Units Subcutaneous Daily  . isosorbide mononitrate  15 mg Oral Daily  . latanoprost  1 drop Both Eyes QHS  . metoprolol tartrate  12.5 mg Oral BID  . multivitamin with minerals  1 tablet Oral Daily  . ondansetron (ZOFRAN) IV  4 mg Intravenous Q6H  . pantoprazole (PROTONIX) IV  40 mg Intravenous Q12H  . polyethylene glycol  17 g Oral Daily  . promethazine  12.5 mg Intravenous Once  . senna-docusate  1 tablet Oral BID  . sucralfate  1 g Oral TID WC & HS   Continuous Infusions:         Aline August, MD [facility] 07/18/2019, 9:19 AM

## 2019-07-19 DIAGNOSIS — G43901 Migraine, unspecified, not intractable, with status migrainosus: Secondary | ICD-10-CM

## 2019-07-19 LAB — MAGNESIUM: Magnesium: 1.8 mg/dL (ref 1.7–2.4)

## 2019-07-19 LAB — COMPREHENSIVE METABOLIC PANEL
ALT: 20 U/L (ref 0–44)
AST: 28 U/L (ref 15–41)
Albumin: 3.2 g/dL — ABNORMAL LOW (ref 3.5–5.0)
Alkaline Phosphatase: 83 U/L (ref 38–126)
Anion gap: 10 (ref 5–15)
BUN: 10 mg/dL (ref 8–23)
CO2: 23 mmol/L (ref 22–32)
Calcium: 9.2 mg/dL (ref 8.9–10.3)
Chloride: 105 mmol/L (ref 98–111)
Creatinine, Ser: 1.68 mg/dL — ABNORMAL HIGH (ref 0.44–1.00)
GFR calc Af Amer: 34 mL/min — ABNORMAL LOW (ref >60)
GFR calc non Af Amer: 29 mL/min — ABNORMAL LOW (ref >60)
Glucose, Bld: 143 mg/dL — ABNORMAL HIGH (ref 70–99)
Potassium: 4.9 mmol/L (ref 3.5–5.1)
Sodium: 138 mmol/L (ref 135–145)
Total Bilirubin: 0.7 mg/dL (ref 0.3–1.2)
Total Protein: 6.2 g/dL — ABNORMAL LOW (ref 6.5–8.1)

## 2019-07-19 LAB — CBC WITH DIFFERENTIAL/PLATELET
Abs Immature Granulocytes: 0.05 10*3/uL (ref 0.00–0.07)
Basophils Absolute: 0.1 10*3/uL (ref 0.0–0.1)
Basophils Relative: 1 %
Eosinophils Absolute: 0.4 10*3/uL (ref 0.0–0.5)
Eosinophils Relative: 6 %
HCT: 39.6 % (ref 36.0–46.0)
Hemoglobin: 12.9 g/dL (ref 12.0–15.0)
Immature Granulocytes: 1 %
Lymphocytes Relative: 28 %
Lymphs Abs: 1.8 10*3/uL (ref 0.7–4.0)
MCH: 29.3 pg (ref 26.0–34.0)
MCHC: 32.6 g/dL (ref 30.0–36.0)
MCV: 90 fL (ref 80.0–100.0)
Monocytes Absolute: 0.5 10*3/uL (ref 0.1–1.0)
Monocytes Relative: 8 %
Neutro Abs: 3.6 10*3/uL (ref 1.7–7.7)
Neutrophils Relative %: 56 %
Platelets: 286 10*3/uL (ref 150–400)
RBC: 4.4 MIL/uL (ref 3.87–5.11)
RDW: 13.2 % (ref 11.5–15.5)
WBC: 6.4 10*3/uL (ref 4.0–10.5)
nRBC: 0 % (ref 0.0–0.2)

## 2019-07-19 LAB — GLUCOSE, CAPILLARY
Glucose-Capillary: 163 mg/dL — ABNORMAL HIGH (ref 70–99)
Glucose-Capillary: 191 mg/dL — ABNORMAL HIGH (ref 70–99)
Glucose-Capillary: 157 mg/dL — ABNORMAL HIGH (ref 70–99)
Glucose-Capillary: 88 mg/dL (ref 70–99)

## 2019-07-19 MED ORDER — OXYCODONE HCL 5 MG PO TABS
5.0000 mg | ORAL_TABLET | ORAL | Status: DC | PRN
  Administered 2019-07-19 – 2019-07-22 (×2): 10 mg via ORAL
  Filled 2019-07-19 (×2): qty 2

## 2019-07-19 MED ORDER — LIP MEDEX EX OINT
TOPICAL_OINTMENT | CUTANEOUS | Status: DC | PRN
  Filled 2019-07-19: qty 7

## 2019-07-19 MED ORDER — MAGNESIUM SULFATE 2 GM/50ML IV SOLN
2.0000 g | Freq: Once | INTRAVENOUS | Status: AC
  Administered 2019-07-19: 2 g via INTRAVENOUS
  Filled 2019-07-19: qty 50

## 2019-07-19 MED ORDER — METOCLOPRAMIDE HCL 5 MG/ML IJ SOLN
10.0000 mg | Freq: Four times a day (QID) | INTRAMUSCULAR | Status: AC
  Administered 2019-07-19 – 2019-07-20 (×4): 10 mg via INTRAVENOUS
  Filled 2019-07-19 (×4): qty 2

## 2019-07-19 MED ORDER — VALPROATE SODIUM 500 MG/5ML IV SOLN
1000.0000 mg | Freq: Once | INTRAVENOUS | Status: AC
  Administered 2019-07-19: 1000 mg via INTRAVENOUS
  Filled 2019-07-19: qty 10

## 2019-07-19 MED ORDER — SODIUM CHLORIDE 0.9 % IV SOLN: INTRAVENOUS | Status: DC

## 2019-07-19 NOTE — Progress Notes (Signed)
Subjective: Today reports she is still having a headache located across the front of head and at the right temporal area with nausea. Reports she has been unable to ambulate since yesterday related to nausea. Reports she is able to keep down liquids. Lights aggravates headache. Resting in a cool dark room with eyes covered with cool washcloth seems to make better. Headache radiates to the back of the neck. States this is not the worst headache she has had. Denies vision change, sensation of numbness and tingling, and chest pain. She got Phenergan per Dr. Cheral Marker and felt somewhat better but then headache returned.   Exam: Vitals:   07/19/19 0550 07/19/19 0555  BP: (!) 146/71   Pulse: 72 68  Resp: 16 (!) 22  Temp: (!) 97.5 F (36.4 C)   SpO2: 90% 95%   Gen: In bed, NAD Resp: non-labored breathing, no acute distress Abd: soft, nt  Neuro: MS: awake, alert, interactive, and appropriate, speech fluent without evidence of aphasia, able to follow commands without difficulty CN: pupils equal, round, and reactive, smile symmetric, facial light touch sensation normal bilaterally, hearing intact to conversation Motor: 5/5 strength, normal tone throughout, able to lift all 4 extremities anti-gravity with good strength Sensory: light touch intact throughout, bilaterally  Pertinent Labs: Creatinine 1.68 Magnesium 1.8  Impression:  74 year old with history of migraine headache admitted for cholecystitis with headache with features of migraine, consistent with status migrainosus.  She had incomplete response to Phenergan, I think other phenothiazines are reasonable, and Reglan as recommended by gastroenterology would be another option.  I would favor scheduled dosing overnight tonight, and will also add magnesium and Depakote as well.  Recommendations: 1) Metoclopramide (Reglan) 10 mg q 6 x 4 doses 2) Magnesium sulfate IVPB 2 g 50 ml x 1 dose 3) Depacon 1 g x 1 4) neurology to continue to  follow.  Roland Rack, MD [facility] [phone]  If 7pm- 7am, please page neurology on call as listed in [place].

## 2019-07-19 NOTE — Progress Notes (Signed)
PT Cancellation Note  Patient Details Name: Maria Fox MRN: [id number] DOB: [birth year]   Cancelled Treatment:    Reason Eval/Treat Not Completed: Other (comment) . Pt adamantly refusing ambulation/mobility due to nausea and headache. Pt states, "I am sick." Encouraged pt to mobilize without success. Will continue attempts.   [place] 07/19/2019, 2:11 PM [place] Pager [phone] Office [phone]

## 2019-07-19 NOTE — Progress Notes (Addendum)
[facility] Progress Note  Chief Complaint: Nausea  History:  Maria Fox is known to me from outpatient procedures in January of this year.  She has intermittent mild nausea responsive to as needed use of antiemetics.  I received signout from Dr. Tarri Glenn who was on call this weekend and evaluated patient for persistent nausea in the setting of migraine since her cholecystitis and cholecystectomy earlier this admission. Maria Fox says she really does not feel much better since yesterday.  She has a constant frontal headache with light sensitivity and persistent nausea limiting her oral intake.  She has just had some clear liquids and occasional Ensure.  It sounds like she has had no substantial nutrition in the last 10 to 14 days.  Neurology evaluation and recommendations from yesterday reviewed.  ROS: Cardiovascular: Denies chest pain Respiratory: Denies dyspnea Urinary: Denies dysuria  Objective:   Current Facility-Administered Medications:  .  acetaminophen (TYLENOL) tablet 650 mg, 650 mg, Oral, Q6H PRN, 650 mg at 07/16/19 0004 **OR** acetaminophen (TYLENOL) suppository 650 mg, 650 mg, Rectal, Q6H PRN, Maczis, [name], PA-C .  albuterol (PROVENTIL) (2.5 MG/3ML) 0.083% nebulizer solution 3 mL, 3 mL, Inhalation, Q6H PRN, Maczis, [name], PA-C .  alum & mag hydroxide-simeth (MAALOX/MYLANTA) 200-200-20 MG/5ML suspension 30 mL, 30 mL, Oral, Q4H PRN, Starla Link, Kshitiz, MD .  Margrett Rud apixaban (ELIQUIS) tablet 10 mg, 10 mg, Oral, BID, 10 mg at 07/16/19 2151 **FOLLOWED BY** apixaban (ELIQUIS) tablet 5 mg, 5 mg, Oral, BID, Donnamae Jude, RPH, 5 mg at 07/18/19 2251 .  aspirin-acetaminophen-caffeine (EXCEDRIN MIGRAINE) per tablet 1 tablet, 1 tablet, Oral, Q6H PRN, Aline August, MD, 1 tablet at 07/16/19 0543 .  atorvastatin (LIPITOR) tablet 40 mg, 40 mg, Oral, Daily, Jillyn Ledger, PA-C, 40 mg at 07/18/19 [id number] .  bisacodyl (DULCOLAX) EC tablet 5 mg, 5 mg, Oral, Daily PRN, Jillyn Ledger, PA-C .   clopidogrel (PLAVIX) tablet 75 mg, 75 mg, Oral, Daily, Alekh, Kshitiz, MD, 75 mg at 07/18/19 0954 .  famotidine (PEPCID) tablet 20 mg, 20 mg, Oral, BID, Thornton Park, MD, 20 mg at 07/18/19 2252 .  feeding supplement (ENSURE ENLIVE) (ENSURE ENLIVE) liquid 237 mL, 237 mL, Oral, TID BM, Alekh, Kshitiz, MD, 237 mL at 07/18/19 2255 .  hydrALAZINE (APRESOLINE) injection 5 mg, 5 mg, Intravenous, Q4H PRN, Maczis, [name], PA-C .  insulin aspart (novoLOG) injection 0-15 Units, 0-15 Units, Subcutaneous, TID WC, Maczis, [name], PA-C, 3 Units at 07/19/19 [phone] .  insulin aspart (novoLOG) injection 0-5 Units, 0-5 Units, Subcutaneous, QHS, Jillyn Ledger, PA-C, 4 Units at 07/10/19 2130 .  insulin glargine (LANTUS) injection 5 Units, 5 Units, Subcutaneous, Daily, Caren Griffins, MD, 5 Units at 07/18/19 0956 .  isosorbide mononitrate (IMDUR) 24 hr tablet 15 mg, 15 mg, Oral, Daily, Jillyn Ledger, PA-C, 15 mg at 07/18/19 0954 .  latanoprost (XALATAN) 0.005 % ophthalmic solution 1 drop, 1 drop, Both Eyes, QHS, Maczis, [name], PA-C, 1 drop at 07/18/19 2254 .  metoprolol tartrate (LOPRESSOR) injection 2.5 mg, 2.5 mg, Intravenous, Q2H PRN, Caren Griffins, MD, 2.5 mg at 07/10/19 1222 .  metoprolol tartrate (LOPRESSOR) tablet 12.5 mg, 12.5 mg, Oral, BID, Jillyn Ledger, PA-C, 12.5 mg at 07/18/19 2252 .  multivitamin with minerals tablet 1 tablet, 1 tablet, Oral, Daily, Starla Link, Kshitiz, MD, 1 tablet at 07/18/19 0954 .  ondansetron (ZOFRAN) injection 4 mg, 4 mg, Intravenous, Q6H, Alekh, Kshitiz, MD, 4 mg at 07/19/19 0620 .  ondansetron (ZOFRAN-ODT) disintegrating tablet 4  mg, 4 mg, Oral, Q6H PRN, Saverio Danker, PA-C, 4 mg at 07/15/19 2143 .  pantoprazole (PROTONIX) injection 40 mg, 40 mg, Intravenous, Q12H, Alekh, Kshitiz, MD, 40 mg at 07/18/19 2257 .  polyethylene glycol (MIRALAX / GLYCOLAX) packet 17 g, 17 g, Oral, Daily, Rolm Bookbinder, MD, 17 g at 07/18/19 [id number] .  promethazine (PHENERGAN)  tablet 25 mg, 25 mg, Oral, Q6H PRN, Starla Link, Kshitiz, MD, 25 mg at 07/17/19 0859 .  senna-docusate (Senokot-S) tablet 1 tablet, 1 tablet, Oral, BID, Alekh, Kshitiz, MD .  sucralfate (CARAFATE) 1 GM/10ML suspension 1 g, 1 g, Oral, TID WC & HS, Thornton Park, MD, 1 g at 07/18/19 1735 .  traMADol (ULTRAM) tablet 50 mg, 50 mg, Oral, Q6H PRN, Saverio Danker, PA-C, 50 mg at 07/17/19 1743 .  traZODone (DESYREL) tablet 75 mg, 75 mg, Oral, QHS PRN, Jillyn Ledger, PA-C, 75 mg at 07/18/19 2251     Vital signs in last 24 hrs: Vitals:   07/19/19 0550 07/19/19 0555  BP: (!) 146/71   Pulse: 72 68  Resp: 16 (!) 22  Temp: (!) 97.5 F (36.4 C)   SpO2: 90% 95%    Intake/Output Summary (Last 24 hours) at 07/19/2019 0848 Last data filed at 07/18/2019 1757 Gross per 24 hour  Intake 240 ml  Output 401 ml  Net -161 ml     Physical Exam She is feeling unwell today.  Blinds are drawn, lights are off and she has a cool cloth on the forehead.  HEENT: sclera anicteric, oral mucosa without lesions  Neck: supple, no thyromegaly, JVD or lymphadenopathy  Cardiac: RRR without murmurs, S1S2 heard, no peripheral edema  Pulm: clear to auscultation bilaterally, normal RR and effort noted  Abdomen: soft, no tenderness, with active bowel sounds. No guarding or palpable hepatosplenomegaly  Skin; warm and dry, no jaundice  Recent Labs:  CBC Latest Ref Rng & Units 07/19/2019 07/18/2019 07/17/2019  WBC 4.0 - 10.5 K/uL 6.4 6.3 5.4  Hemoglobin 12.0 - 15.0 g/dL 12.9 12.9 12.4  Hematocrit 36.0 - 46.0 % 39.6 39.5 37.9  Platelets 150 - 400 K/uL 286 255 266    No results for input(s): INR in the last 168 hours. CMP Latest Ref Rng & Units 07/19/2019 07/18/2019 07/17/2019  Glucose 70 - 99 mg/dL 143(H) 121(H) 113(H)  BUN 8 - 23 mg/dL 10 8 10   Creatinine 0.44 - 1.00 mg/dL 1.68(H) 1.43(H) 1.51(H)  Sodium 135 - 145 mmol/L 138 137 139  Potassium 3.5 - 5.1 mmol/L 4.9 5.5(H) 4.4  Chloride 98 - 111 mmol/L 105 105 104  CO2  22 - 32 mmol/L 23 23 26   Calcium 8.9 - 10.3 mg/dL 9.2 9.1 8.7(L)  Total Protein 6.5 - 8.1 g/dL 6.2(L) - 5.7(L)  Total Bilirubin 0.3 - 1.2 mg/dL 0.7 - 0.7  Alkaline Phos 38 - 126 U/L 83 - 69  AST 15 - 41 U/L 28 - 21  ALT 0 - 44 U/L 20 - 20     Radiologic studies: Report of MRI head reviewed  Assessment & Plan  Assessment: Persistent nausea without vomiting.  This is not typical for gastroparesis.  She has a history of intermittent underlying nausea, clearly exacerbated by her persistent status migraine since recent cholecystitis.  Current regimen of standing dose ondansetron and as needed Phenergan has had modest effect so far. I discussed her plan with the hospitalist physician. Plan: She needs IV fluid support Neurology had recommended magnesium, not clear yet if that was given. Ask neurology consultants  to follow patient for further recommendations on headache pain control.  Perhaps opioids are required in order to break the pain cycle and therefore remainder of symptoms.  Change Phenergan to standing dose metoclopramide alternating with standing dose ondansetron, and check EKG in 24 to 48 hours to ensure stability of QT interval.  (QT interval was recently normal on preop EKG)  If no progress improving her nutritional intake very soon, will need PICC line and TPN, though certainly hoping to avoid that if possible given risks of line infection and thrombosis.  Upper endoscopy would be of low yield at this point I am not currently planning to do so.  Total time 25 minutes  Nelida Meuse III Office: [phone]

## 2019-07-20 LAB — CBC WITH DIFFERENTIAL/PLATELET
Abs Immature Granulocytes: 0.03 10*3/uL (ref 0.00–0.07)
Basophils Absolute: 0.1 10*3/uL (ref 0.0–0.1)
Basophils Relative: 1 %
Eosinophils Absolute: 0.4 10*3/uL (ref 0.0–0.5)
Eosinophils Relative: 6 %
HCT: 36.5 % (ref 36.0–46.0)
Hemoglobin: 12 g/dL (ref 12.0–15.0)
Immature Granulocytes: 1 %
Lymphocytes Relative: 32 %
Lymphs Abs: 2.1 10*3/uL (ref 0.7–4.0)
MCH: 29.8 pg (ref 26.0–34.0)
MCHC: 32.9 g/dL (ref 30.0–36.0)
MCV: 90.6 fL (ref 80.0–100.0)
Monocytes Absolute: 0.5 10*3/uL (ref 0.1–1.0)
Monocytes Relative: 7 %
Neutro Abs: 3.4 10*3/uL (ref 1.7–7.7)
Neutrophils Relative %: 53 %
Platelets: 254 10*3/uL (ref 150–400)
RBC: 4.03 MIL/uL (ref 3.87–5.11)
RDW: 13.2 % (ref 11.5–15.5)
WBC: 6.4 10*3/uL (ref 4.0–10.5)
nRBC: 0 % (ref 0.0–0.2)

## 2019-07-20 LAB — BASIC METABOLIC PANEL
Anion gap: 7 (ref 5–15)
BUN: 11 mg/dL (ref 8–23)
CO2: 22 mmol/L (ref 22–32)
Calcium: 8.4 mg/dL — ABNORMAL LOW (ref 8.9–10.3)
Chloride: 108 mmol/L (ref 98–111)
Creatinine, Ser: 1.42 mg/dL — ABNORMAL HIGH (ref 0.44–1.00)
GFR calc Af Amer: 41 mL/min — ABNORMAL LOW (ref >60)
GFR calc non Af Amer: 36 mL/min — ABNORMAL LOW (ref >60)
Glucose, Bld: 112 mg/dL — ABNORMAL HIGH (ref 70–99)
Potassium: 4.4 mmol/L (ref 3.5–5.1)
Sodium: 137 mmol/L (ref 135–145)

## 2019-07-20 LAB — GLUCOSE, CAPILLARY
Glucose-Capillary: 93 mg/dL (ref 70–99)
Glucose-Capillary: 112 mg/dL — ABNORMAL HIGH (ref 70–99)
Glucose-Capillary: 128 mg/dL — ABNORMAL HIGH (ref 70–99)
Glucose-Capillary: 129 mg/dL — ABNORMAL HIGH (ref 70–99)

## 2019-07-20 LAB — MAGNESIUM: Magnesium: 2.3 mg/dL (ref 1.7–2.4)

## 2019-07-20 MED ORDER — PROCHLORPERAZINE EDISYLATE 10 MG/2ML IJ SOLN
10.0000 mg | Freq: Three times a day (TID) | INTRAMUSCULAR | Status: AC
  Administered 2019-07-20 (×2): 10 mg via INTRAVENOUS
  Filled 2019-07-20 (×4): qty 2

## 2019-07-20 MED ORDER — GABAPENTIN 300 MG PO CAPS
300.0000 mg | ORAL_CAPSULE | Freq: Three times a day (TID) | ORAL | Status: DC
  Administered 2019-07-20 – 2019-07-21 (×4): 300 mg via ORAL
  Filled 2019-07-20 (×4): qty 1

## 2019-07-20 MED ORDER — DIPHENHYDRAMINE HCL 50 MG/ML IJ SOLN
12.5000 mg | Freq: Once | INTRAMUSCULAR | Status: AC
  Administered 2019-07-20: 12.5 mg via INTRAVENOUS
  Filled 2019-07-20: qty 1

## 2019-07-20 NOTE — Progress Notes (Signed)
Neurology follow up noted. Needs effective treatment for migraine to have improvement in nausea. Nothing else to add now. Call as needed.

## 2019-07-20 NOTE — Progress Notes (Signed)
Patient ID: Maria Fox, female   DOB: May 03, 1945, 75 y.o.   MRN: [id number]  PROGRESS NOTE    Maria Fox  [name] DOB: 02-22-1946 DOA: 07/06/2019 PCP: [name], MD   Brief Narrative:  74 year old female with HTN, HLD, DM, history of CVA, CAD status post CABG, came into the hospital with abdominal pain, mainly on the right side which started about 1 to 2 weeks ago.  She was diagnosed with acute cholecystitis and started on IV antibiotics.  She underwent lap chole on 06/29/2019.  Postsurgical course was complicated by atrial flutter, chest pain, respite distress and acute DVT/presumed PE.  She was treated with heparin and was subsequently switched to oral Eliquis.  Cardiology was also consulted and patient was started on IV amiodarone.   Assessment & Plan:   Acute cholecystitis Postoperative nausea -status post laparoscopic cholecystectomy on 07/07/19.  Appreciate surgery follow-up, seems to be recovering well -Still has intermittent nausea with poor oral intake. -Despite being on around-the-clock Zofran and twice a day Protonix, she still feels that she is very nauseous.  Her LFTs are still normal.  CT of the abdomen on 07/16/2019 with p.o. contrast showed findings concerning for ?  Early tip appendicitis.  CT of the abdomen was then reviewed by general surgery/Dr. Donne Hazel who thought that it was not appendicitis. -Continue as needed Phenergan.  GI consulted on 07/17/2019: Carafate added.  Continue around-the-clock Zofran and will add round-the-clock Reglan as well for the next 24 hours as per GI recommendation.  Check QTC in a.m.  Headache -Still complains of intermittent nausea with bad headaches and feels that she is drunk.  Continue Excedrin as needed along with Phenergan as needed.  Patient received Imitrex 1 dose each for 2 days with no improvement.  -CT of the head without contrast was negative for acute abnormality. -MRI of the brain with contrast did not show any acute  infarct.  Patient received a dose of IV Phenergan yesterday.  We will give IV magnesium sulfate and IV fluids as per neurology recommendations. -We will also add oral opiate as needed in hopes to improve the headache.   Acute DVT, presumed pulmonary embolism with acute hypoxic respiratory failure -patient with chest pain and new onset a flutter on 07/10/19.  D-dimer was elevated and she was empirically started on heparin out of concern for PE.  Lower extremity Dopplers did show an acute DVT.  CT angiogram was initially ordered however she does have an allergy to IV contrast and borderline renal function, and given acute DVT I would presume she also has a PE and CT scan was canceled.  2D echo did not show any RV strain, normal LVEF -Tolerated heparin well, hemoglobin is stable and she was started on Eliquis on 07/12/19.    A flutter/chest pain -cardiology consult appreciated, she was started on amiodarone and now she is in sinus rhythm.  Continue beta-blockers, cardiology recommending IV amiodarone up until discharge per their note.  They signed off and set up outpatient follow-up appointment -Because of intermittent nausea/vomiting, I discontinued IV amiodarone on 07/14/2019.  Heart rate currently stable. -Outpatient follow-up with cardiology.  Acute hypoxic respiratory failure -Incentive spirometry.  Patient received a dose of IV Lasix on 07/14/2019.   -Currently on room air.  Acute kidney injury on chronic kidney disease stage IIIa -creatinine overall stable, did increase slightly after Lasix when she was in respiratory distress on 5/2 -Creatinine 1.68  today.  Monitor.  IV fluid plan as above.  Essential hypertension-continue metoprolol  Type 2 diabetes mellitus -CBGs with with sliding scale.  Continue Lantus  Hyperlipidemia-continue atorvastatin  Coronary artery disease, history of chronic diastolic CHF, history of CABG -Currently stable. Troponins flat     DVT prophylaxis:  Eliquis Code Status: Full Family Communication: Patient at bedside Disposition Plan: Status is: Inpatient  Remains inpatient appropriate because: Still intermittently nauseous with poor oral intake and with intermittent headaches.  Does not feel ready to go home yet today.  Will consult GI.  Hopefully discharge in 1 to 2 days once her nausea and headache is improved.  Dispo: The patient is from: Home              Anticipated d/c is to: Home              Anticipated d/c date is: 1 day              Patient currently is not medically stable to d/c.    Consultants: General surgery/cardiology/GI/neurology  Procedures: Laparoscopic cholecystectomy on 07/07/2019  Antimicrobials:  Anti-infectives (From admission, onward)   Start     Dose/Rate Route Frequency Ordered Stop   07/08/19 0500  cefTRIAXone (ROCEPHIN) 2 g in sodium chloride 0.9 % 100 mL IVPB  Status:  Discontinued     2 g 200 mL/hr over 30 Minutes Intravenous Every 24 hours 07/07/19 0839 07/07/19 1802   07/07/19 0245  cefTRIAXone (ROCEPHIN) 2 g in sodium chloride 0.9 % 100 mL IVPB     2 g 200 mL/hr over 30 Minutes Intravenous  Once 07/07/19 0231 07/07/19 0451       Subjective: Patient seen and examined at bedside.  Still feels the same.  Complains of constant headache with intermittent nausea.  Her oral intake is very poor.  No overnight fevers. Objective: Vitals:   07/19/19 2013 07/19/19 2328 07/20/19 0428 07/20/19 0756  BP: (!) 127/98 (!) 107/51 (!) 113/59 (!) 144/66  Pulse: 66 64 61 65  Resp: 19 14 15 19   Temp: 97.8 F (36.6 C) 97.8 F (36.6 C) 98 F (36.7 C) 97.9 F (36.6 C)  TempSrc: Oral Oral Oral Oral  SpO2: 96% 96% 95% 94%  Weight:   67.4 kg   Height:        Intake/Output Summary (Last 24 hours) at 07/20/2019 0857 Last data filed at 07/19/2019 2233 Gross per 24 hour  Intake 1276.81 ml  Output 450 ml  Net 826.81 ml   Filed Weights   07/18/19 0530 07/19/19 0555 07/20/19 0428  Weight: 67.9 kg 66.3 kg  67.4 kg    Examination:  General exam: Still in some distress secondary to headache.  Chronically ill looking. Respiratory system: Bilateral decreased breath sounds at bases; intermittent tachypnea.   Cardiovascular system: S1-S2 heard, rate controlled  gastrointestinal system: Abdomen is nondistended, soft and nontender.  Incisions are clean and dry.  Normal bowel sounds heard Extremities: Mild lower extremity edema present.  No clubbing  central nervous system: Alert and awake.  Poor historian.  No focal neurological deficits. Moving extremities Skin: No petechiae or ulcers Psychiatry: Anxious looking   Data Reviewed: I have personally reviewed following labs and imaging studies  CBC: Recent Labs  Lab 07/16/19 0310 07/17/19 0436 07/18/19 0228 07/19/19 0250 07/20/19 0306  WBC 6.2 5.4 6.3 6.4 6.4  NEUTROABS 3.2 3.0 3.5 3.6 3.4  HGB 12.3 12.4 12.9 12.9 12.0  HCT 37.1 37.9 39.5 39.6 36.5  MCV 90.5 90.5 91.6 90.0 90.6  PLT 242 266 255 286  0000000   Basic Metabolic Panel: Recent Labs  Lab 07/16/19 0310 07/17/19 0436 07/18/19 0228 07/19/19 0250 07/20/19 0306  NA 136 139 137 138 137  K 4.1 4.4 5.5* 4.9 4.4  CL 100 104 105 105 108  CO2 26 26 23 23 22   GLUCOSE 138* 113* 121* 143* 112*  BUN 12 10 8 10 11   CREATININE 1.61* 1.51* 1.43* 1.68* 1.42*  CALCIUM 8.5* 8.7* 9.1 9.2 8.4*  MG 1.7 1.9 2.0 1.8 2.3   GFR: Estimated Creatinine Clearance: 30.3 mL/min (A) (by C-G formula based on SCr of 1.42 mg/dL (H)). Liver Function Tests: Recent Labs  Lab 07/15/19 0237 07/16/19 0310 07/17/19 0436 07/19/19 0250  AST 28 22 21 28   ALT 27 22 20 20   ALKPHOS 75 71 69 83  BILITOT 0.5 0.6 0.7 0.7  PROT 6.0* 5.7* 5.7* 6.2*  ALBUMIN 3.1* 2.9* 2.8* 3.2*   Recent Labs  Lab 07/15/19 0237  LIPASE 18   No results for input(s): AMMONIA in the last 168 hours. Coagulation Profile: No results for input(s): INR, PROTIME in the last 168 hours. Cardiac Enzymes: No results for input(s):  CKTOTAL, CKMB, CKMBINDEX, TROPONINI in the last 168 hours. BNP (last 3 results) No results for input(s): PROBNP in the last 8760 hours. HbA1C: No results for input(s): HGBA1C in the last 72 hours. CBG: Recent Labs  Lab 07/19/19 0558 07/19/19 1144 07/19/19 1639 07/19/19 2121 07/20/19 0600  GLUCAP 163* 191* 157* 88 93   Lipid Profile: No results for input(s): CHOL, HDL, LDLCALC, TRIG, CHOLHDL, LDLDIRECT in the last 72 hours. Thyroid Function Tests: No results for input(s): TSH, T4TOTAL, FREET4, T3FREE, THYROIDAB in the last 72 hours. Anemia Panel: No results for input(s): VITAMINB12, FOLATE, FERRITIN, TIBC, IRON, RETICCTPCT in the last 72 hours. Sepsis Labs: No results for input(s): PROCALCITON, LATICACIDVEN in the last 168 hours.  No results found for this or any previous visit (from the past 240 hour(s)).       Radiology Studies: MR BRAIN W WO CONTRAST  Result Date: 07/18/2019 CLINICAL DATA:  Acute headache with normal neuro exam EXAM: MRI HEAD WITHOUT AND WITH CONTRAST TECHNIQUE: Multiplanar, multiecho pulse sequences of the brain and surrounding structures were obtained without and with intravenous contrast. CONTRAST:  6.46mL GADAVIST GADOBUTROL 1 MMOL/ML IV SOLN COMPARISON:  CT two days ago FINDINGS: Brain: No acute infarction, hemorrhage, hydrocephalus, extra-axial collection or mass lesion. No abnormal intracranial enhancement. Moderate remote left occipital infarct. Chronic small vessel ischemia in the cerebral white matter that is mild. Generalized cerebral volume loss. Vascular: Preserved flow voids and vascular enhancements. There is a right V4 segment stenosis. Skull and upper cervical spine: Negative Sinuses/Orbits: Negative IMPRESSION: 1. No acute or reversible finding. 2. Moderate remote left occipital infarct. Electronically Signed   By: Monte Fantasia M.D.   On: 07/18/2019 13:24        Scheduled Meds: . apixaban  5 mg Oral BID  . atorvastatin  40 mg Oral Daily   . clopidogrel  75 mg Oral Daily  . famotidine  20 mg Oral BID  . feeding supplement (ENSURE ENLIVE)  237 mL Oral TID BM  . insulin aspart  0-15 Units Subcutaneous TID WC  . insulin aspart  0-5 Units Subcutaneous QHS  . insulin glargine  5 Units Subcutaneous Daily  . isosorbide mononitrate  15 mg Oral Daily  . latanoprost  1 drop Both Eyes QHS  . metoprolol tartrate  12.5 mg Oral BID  . multivitamin with minerals  1  tablet Oral Daily  . ondansetron (ZOFRAN) IV  4 mg Intravenous Q6H  . pantoprazole (PROTONIX) IV  40 mg Intravenous Q12H  . polyethylene glycol  17 g Oral Daily  . senna-docusate  1 tablet Oral BID  . sucralfate  1 g Oral TID WC & HS   Continuous Infusions: . sodium chloride 100 mL/hr at 07/20/19 0559          Aline August, MD [facility] 07/20/2019, 8:57 AM

## 2019-07-20 NOTE — Progress Notes (Signed)
Subjective: Her headache is somewhat improved, but she still has some bifrontal headache.  Nausea likewise has made some improvement.  Exam: Vitals:   07/20/19 0428 07/20/19 0756  BP: (!) 113/59 (!) 144/66  Pulse: 61 65  Resp: 15 19  Temp: 98 F (36.7 C) 97.9 F (36.6 C)  SpO2: 95% 94%   Gen: In bed, NAD Resp: non-labored breathing, no acute distress Abd: soft, nt   Neuro:  MS: alert, awake, interactive, and appropriate, speech fluent without evidence of aphasia, able to follow commands without difficulty CN: pupils equal, round, and reactive, smile symmetric, facial light touch sensation normal bilaterally, hearing intact to conversation Motor: 5/5 strength, normal tone throughout, able to lift all 4 extremities anti-gravity with good strength Sensory: light touch intact throughout, bilaterally   Impression: 74 year old female with persistent headache with migrainous features in the setting of a history of migraines, most consistent with status migrainosus.  With her history of cerebrovascular disease, avoiding DHE, and she has had some response to Reglan, I would favor additional day of phenothiazine therapy, I sometimes get better response from Compazine then Reglan and will try this today.  Given the persistence of phenothiazines, I will give dose of Benadryl this morning as well.  She has taken gabapentin in the past for neuropathic pain, and was requesting a, this can also be useful for headaches and therefore I will start this as well.  Recommendations: 1) Compazine 10 mg every 8 hours, Benadryl 12.5 mg x 1 2) Gabapentin 300 mg 3 times daily, though noting that this dose is higher than might otherwise be expected due to borderline renal function.  If she has side effects, can reduce.  Roland Rack, MD [facility] [phone]  If 7pm- 7am, please page neurology on call as listed in [place].

## 2019-07-20 NOTE — Progress Notes (Signed)
Physical Therapy Treatment Patient Details Name: Maria Fox MRN: [id number] DOB: May 11, 1945 Today's Date: 07/20/2019    History of Present Illness Pt is 74 yo female with HTN, HLD, DM, history of CVA, CAD status post CABG, came into the hospital with abdominal pain, mainly on the right side which started about 1 to 2 weeks ago.  She was diagnosed with acute cholecystitis and is s/p lap chole on 4/28. Postoperative course was complicated by development of a flutter, chest pain, respiratory distress and acute DVT /presumed PE    PT Comments    Pt agreeable to mobility today. Amb in hallway. Stood at sink and brushed teeth before returning to sit EOB. Moves fairly well just hasn't been mobilizing much.    Follow Up Recommendations  Home health PT     Equipment Recommendations  None recommended by PT    Recommendations for Other Services       Precautions / Restrictions Precautions Precautions: None Restrictions Weight Bearing Restrictions: No    Mobility  Bed Mobility Overal bed mobility: Modified Independent Bed Mobility: Supine to Sit;Sit to Supine     Supine to sit: Modified independent (Device/Increase time);HOB elevated Sit to supine: Modified independent (Device/Increase time);HOB elevated      Transfers Overall transfer level: Modified independent Equipment used: 4-wheeled walker;None Transfers: Sit to/from Stand Sit to Stand: Modified independent (Device/Increase time)            Ambulation/Gait Ambulation/Gait assistance: Scientist, forensic (Feet): 400 Feet Assistive device: 4-wheeled walker;None Gait Pattern/deviations: Step-through pattern;Decreased stride length Gait velocity: decr Gait velocity interpretation: 1.31 - 2.62 ft/sec, indicative of limited community ambulator General Gait Details: Steady gait with rollator   Stairs             Wheelchair Mobility    Modified Rankin (Stroke Patients Only)       Balance Overall  balance assessment: Needs assistance Sitting-balance support: No upper extremity supported;Feet supported Sitting balance-Leahy Scale: Good     Standing balance support: No upper extremity supported Standing balance-Leahy Scale: Fair                              Cognition Arousal/Alertness: Awake/alert Behavior During Therapy: WFL for tasks assessed/performed Overall Cognitive Status: Within Functional Limits for tasks assessed                                        Exercises      General Comments        Pertinent Vitals/Pain Pain Assessment: No/denies pain    Home Living                      Prior Function            PT Goals (current goals can now be found in the care plan section) Acute Rehab PT Goals Patient Stated Goal: return home Progress towards PT goals: Progressing toward goals    Frequency    Min 3X/week      PT Plan Current plan remains appropriate    Co-evaluation              AM-PAC PT "6 Clicks" Mobility   Outcome Measure  Help needed turning from your back to your side while in a flat bed without using bedrails?: None Help needed moving from lying on your  back to sitting on the side of a flat bed without using bedrails?: None Help needed moving to and from a bed to a chair (including a wheelchair)?: A Little Help needed standing up from a chair using your arms (e.g., wheelchair or bedside chair)?: None Help needed to walk in hospital room?: A Little Help needed climbing 3-5 steps with a railing? : A Little 6 Click Score: 21    End of Session   Activity Tolerance: Patient tolerated treatment well Patient left: in bed;with call bell/phone within reach(sitting EOB) Nurse Communication: Mobility status PT Visit Diagnosis: Unsteadiness on feet (R26.81);Muscle weakness (generalized) (M62.81)     Time: [id number] PT Time Calculation (min) (ACUTE ONLY): 10 min  Charges:  $Gait Training: 8-22  mins                     Vincent Pager [phone] Office [place] 07/20/2019, 12:06 PM

## 2019-07-20 NOTE — Progress Notes (Signed)
Nutrition Follow up  DOCUMENTATION CODES:   Not applicable  INTERVENTION:    Ensure Enlive po TID, each supplement provides 350 kcal and 20 grams of protein  Provide MVI daily  NUTRITION DIAGNOSIS:   Increased nutrient needs related to post-op healing as evidenced by estimated needs.  Ongoing  GOAL:   Patient will meet greater than or equal to 90% of their needs   Progressing   MONITOR:   PO intake, Supplement acceptance, Weight trends, Labs, I & O's, Skin  REASON FOR ASSESSMENT:   LOS    ASSESSMENT:   Patient with PMH significant for HTN, HLD, DM, CVA, autoimmune deficiency syndrome, and CAD s/p CABG. Presents this admission with acute cholecystitis.   4/28- s/p laparoscopic cholecystectomy   Pt reports appetite is slowly progressing. Last seven meals completions charted as 25-100% (61% average). Drinking Ensure 1-2 times daily. RD encouraged meal and supplement intake.   Admission weight: 61.2 kg  Current weight: 67.4 kg   I/O: +645 ml since admit  UOP: 450 ml x 24 hrs   Medications: SS novolog, lantus, MVI, miralax, senokot Labs: CBG 88-191  Diet Order:   Diet Order            Diet heart healthy/carb modified Room service appropriate? Yes with Assist; Fluid consistency: Thin  Diet effective now              EDUCATION NEEDS:   Not appropriate for education at this time  Skin:  Skin Assessment: Skin Integrity Issues: Skin Integrity Issues:: Incisions Incisions: abdomen  Last BM:  5/10  Height:   Ht Readings from Last 1 Encounters:  07/10/19 5\' 2"  (1.575 m)    Weight:   Wt Readings from Last 1 Encounters:  07/20/19 67.4 kg    BMI:  Body mass index is 27.18 kg/m.  Estimated Nutritional Needs:   Kcal:  1750-1950 kcal  Protein:  90-105 grams  Fluid:  >/= 1.7 L/day   Mariana Single RD, LDN Clinical Nutrition Pager listed in [place]

## 2019-07-20 NOTE — Progress Notes (Signed)
Patient ID: RAMON RIDLING, female   DOB: 1945-09-04, 74 y.o.   MRN: [id number]  PROGRESS NOTE    RYIN QUINONES  [name] DOB: 1945-04-17 DOA: 07/06/2019 PCP: [name], MD   Brief Narrative:  74 year old female with HTN, HLD, DM, history of CVA, CAD status post CABG, came into the hospital with abdominal pain, mainly on the right side which started about 1 to 2 weeks ago.  She was diagnosed with acute cholecystitis and started on IV antibiotics.  She underwent lap chole on 06/29/2019.  Postsurgical course was complicated by atrial flutter, chest pain, respite distress and acute DVT/presumed PE.  She was treated with heparin and was subsequently switched to oral Eliquis.  Cardiology was also consulted and patient was started on IV amiodarone.   Assessment & Plan:   Acute cholecystitis Postoperative nausea -status post laparoscopic cholecystectomy on 07/07/19.  Appreciate surgery follow-up, seems to be recovering well -Still has intermittent nausea with poor oral intake. -Despite being on around-the-clock Zofran and twice a day Protonix, she still feels that she is very nauseous.  Her LFTs are still normal.  CT of the abdomen on 07/16/2019 with p.o. contrast showed findings concerning for ?  Early tip appendicitis.  CT of the abdomen was then reviewed by general surgery/Dr. Donne Hazel who thought that it was not appendicitis. -Continue as needed Phenergan.  GI consulted on 07/17/2019 and recommend effective management of migraine to help with nausea: Carafate added.  Continue around-the-clock Zofran and continue around-the-clock Reglan for the next 24 hours as well.  Check QTC in a.m.  Migraine/severe persistent headache -Continue Excedrin as needed along with Phenergan as needed.  Patient received Imitrex 1 dose each for 2 days with no improvement.  -CT of the head without contrast was negative for acute abnormality. -MRI of the brain with contrast did not show any acute infarct.   -Received  a dose of Depakote on 07/19/2019 by neurology.  Headache slightly improving but still has persistent headache.  Does not feel well enough to go home with intermittent nausea and still very poor appetite. -Continue oxycodone as needed as well  Acute DVT, presumed pulmonary embolism with acute hypoxic respiratory failure -patient with chest pain and new onset a flutter on 07/10/19.  D-dimer was elevated and she was empirically started on heparin out of concern for PE.  Lower extremity Dopplers did show an acute DVT.  CT angiogram was initially ordered however she does have an allergy to IV contrast and borderline renal function, and given acute DVT I would presume she also has a PE and CT scan was canceled.  2D echo did not show any RV strain, normal LVEF -Tolerated heparin well, hemoglobin is stable and she was started on Eliquis on 07/12/19.    A flutter/chest pain -cardiology consult appreciated, she was started on amiodarone and now she is in sinus rhythm.  Continue beta-blockers, cardiology recommending IV amiodarone up until discharge per their note.  They signed off and set up outpatient follow-up appointment -Because of intermittent nausea/vomiting, I discontinued IV amiodarone on 07/14/2019.  Heart rate currently stable. -Outpatient follow-up with cardiology.  Acute hypoxic respiratory failure -Incentive spirometry.  Patient received a dose of IV Lasix on 07/14/2019.   -Currently on room air.  Acute kidney injury on chronic kidney disease stage IIIa -creatinine overall stable, did increase slightly after Lasix when she was in respiratory distress on 5/2 -Creatinine improving to 1.42 today.  Monitor.  DC IV fluids.  Essential hypertension-continue metoprolol  Type 2 diabetes mellitus -CBGs with with sliding scale.  Continue Lantus  Hyperlipidemia-continue atorvastatin  Coronary artery disease, history of chronic diastolic CHF, history of CABG -Currently stable. Troponins flat     DVT  prophylaxis: Eliquis Code Status: Full Family Communication: Patient at bedside Disposition Plan: Status is: Inpatient  Remains inpatient appropriate because: Still intermittently nauseous with poor oral intake and with intermittent headaches.  Does not feel ready to go home yet today.  Neurology following.  Hopefully discharge in 1 to 2 days once her nausea and headache is improved.  Dispo: The patient is from: Home              Anticipated d/c is to: Home              Anticipated d/c date is: 1 day              Patient currently is not medically stable to d/c.    Consultants: General surgery/cardiology/GI/neurology  Procedures: Laparoscopic cholecystectomy on 07/07/2019  Antimicrobials:  Anti-infectives (From admission, onward)   Start     Dose/Rate Route Frequency Ordered Stop   07/08/19 0500  cefTRIAXone (ROCEPHIN) 2 g in sodium chloride 0.9 % 100 mL IVPB  Status:  Discontinued     2 g 200 mL/hr over 30 Minutes Intravenous Every 24 hours 07/07/19 0839 07/07/19 1802   07/07/19 0245  cefTRIAXone (ROCEPHIN) 2 g in sodium chloride 0.9 % 100 mL IVPB     2 g 200 mL/hr over 30 Minutes Intravenous  Once 07/07/19 0231 07/07/19 0451       Subjective: Patient seen and examined at bedside.  States that her headache is slightly better but still still has persistent headache.  Still feels nausea with very poor appetite.  No overnight fever or chest pain reported.  Objective: Vitals:   07/19/19 2013 07/19/19 2328 07/20/19 0428 07/20/19 0756  BP: (!) 127/98 (!) 107/51 (!) 113/59 (!) 144/66  Pulse: 66 64 61 65  Resp: 19 14 15 19   Temp: 97.8 F (36.6 C) 97.8 F (36.6 C) 98 F (36.7 C) 97.9 F (36.6 C)  TempSrc: Oral Oral Oral Oral  SpO2: 96% 96% 95% 94%  Weight:   67.4 kg   Height:        Intake/Output Summary (Last 24 hours) at 07/20/2019 0858 Last data filed at 07/19/2019 2233 Gross per 24 hour  Intake 1276.81 ml  Output 450 ml  Net 826.81 ml   Filed Weights   07/18/19 0530  07/19/19 0555 07/20/19 0428  Weight: 67.9 kg 66.3 kg 67.4 kg    Examination:  General exam: No acute distress.  Chronically ill looking. Respiratory system: Bilateral decreased breath sounds at bases with some scattered crackles  cardiovascular system: Rate controlled, S1-S2 heard gastrointestinal system: Abdomen is nondistended, soft and nontender.  Incisions are clean and dry.  Bowel sounds are heard Extremities: Trace bilateral lower extremity edema present.  No clubbing  central nervous system: Awake and oriented poor historian.  No focal neurological deficits. Moving extremities Skin: No new rashes, lesions Psychiatry: Extremely anxious looking  Data Reviewed: I have personally reviewed following labs and imaging studies  CBC: Recent Labs  Lab 07/16/19 0310 07/17/19 0436 07/18/19 0228 07/19/19 0250 07/20/19 0306  WBC 6.2 5.4 6.3 6.4 6.4  NEUTROABS 3.2 3.0 3.5 3.6 3.4  HGB 12.3 12.4 12.9 12.9 12.0  HCT 37.1 37.9 39.5 39.6 36.5  MCV 90.5 90.5 91.6 90.0 90.6  PLT 242 266 255 286 254  Basic Metabolic Panel: Recent Labs  Lab 07/16/19 0310 07/17/19 0436 07/18/19 0228 07/19/19 0250 07/20/19 0306  NA 136 139 137 138 137  K 4.1 4.4 5.5* 4.9 4.4  CL 100 104 105 105 108  CO2 26 26 23 23 22   GLUCOSE 138* 113* 121* 143* 112*  BUN 12 10 8 10 11   CREATININE 1.61* 1.51* 1.43* 1.68* 1.42*  CALCIUM 8.5* 8.7* 9.1 9.2 8.4*  MG 1.7 1.9 2.0 1.8 2.3   GFR: Estimated Creatinine Clearance: 30.3 mL/min (A) (by C-G formula based on SCr of 1.42 mg/dL (H)). Liver Function Tests: Recent Labs  Lab 07/15/19 0237 07/16/19 0310 07/17/19 0436 07/19/19 0250  AST 28 22 21 28   ALT 27 22 20 20   ALKPHOS 75 71 69 83  BILITOT 0.5 0.6 0.7 0.7  PROT 6.0* 5.7* 5.7* 6.2*  ALBUMIN 3.1* 2.9* 2.8* 3.2*   Recent Labs  Lab 07/15/19 0237  LIPASE 18   No results for input(s): AMMONIA in the last 168 hours. Coagulation Profile: No results for input(s): INR, PROTIME in the last 168  hours. Cardiac Enzymes: No results for input(s): CKTOTAL, CKMB, CKMBINDEX, TROPONINI in the last 168 hours. BNP (last 3 results) No results for input(s): PROBNP in the last 8760 hours. HbA1C: No results for input(s): HGBA1C in the last 72 hours. CBG: Recent Labs  Lab 07/19/19 0558 07/19/19 1144 07/19/19 1639 07/19/19 2121 07/20/19 0600  GLUCAP 163* 191* 157* 88 93   Lipid Profile: No results for input(s): CHOL, HDL, LDLCALC, TRIG, CHOLHDL, LDLDIRECT in the last 72 hours. Thyroid Function Tests: No results for input(s): TSH, T4TOTAL, FREET4, T3FREE, THYROIDAB in the last 72 hours. Anemia Panel: No results for input(s): VITAMINB12, FOLATE, FERRITIN, TIBC, IRON, RETICCTPCT in the last 72 hours. Sepsis Labs: No results for input(s): PROCALCITON, LATICACIDVEN in the last 168 hours.  No results found for this or any previous visit (from the past 240 hour(s)).       Radiology Studies: MR BRAIN W WO CONTRAST  Result Date: 07/18/2019 CLINICAL DATA:  Acute headache with normal neuro exam EXAM: MRI HEAD WITHOUT AND WITH CONTRAST TECHNIQUE: Multiplanar, multiecho pulse sequences of the brain and surrounding structures were obtained without and with intravenous contrast. CONTRAST:  6.10mL GADAVIST GADOBUTROL 1 MMOL/ML IV SOLN COMPARISON:  CT two days ago FINDINGS: Brain: No acute infarction, hemorrhage, hydrocephalus, extra-axial collection or mass lesion. No abnormal intracranial enhancement. Moderate remote left occipital infarct. Chronic small vessel ischemia in the cerebral white matter that is mild. Generalized cerebral volume loss. Vascular: Preserved flow voids and vascular enhancements. There is a right V4 segment stenosis. Skull and upper cervical spine: Negative Sinuses/Orbits: Negative IMPRESSION: 1. No acute or reversible finding. 2. Moderate remote left occipital infarct. Electronically Signed   By: Monte Fantasia M.D.   On: 07/18/2019 13:24        Scheduled Meds: . apixaban   5 mg Oral BID  . atorvastatin  40 mg Oral Daily  . clopidogrel  75 mg Oral Daily  . famotidine  20 mg Oral BID  . feeding supplement (ENSURE ENLIVE)  237 mL Oral TID BM  . insulin aspart  0-15 Units Subcutaneous TID WC  . insulin aspart  0-5 Units Subcutaneous QHS  . insulin glargine  5 Units Subcutaneous Daily  . isosorbide mononitrate  15 mg Oral Daily  . latanoprost  1 drop Both Eyes QHS  . metoprolol tartrate  12.5 mg Oral BID  . multivitamin with minerals  1 tablet Oral Daily  .  ondansetron (ZOFRAN) IV  4 mg Intravenous Q6H  . pantoprazole (PROTONIX) IV  40 mg Intravenous Q12H  . polyethylene glycol  17 g Oral Daily  . senna-docusate  1 tablet Oral BID  . sucralfate  1 g Oral TID WC & HS   Continuous Infusions: . sodium chloride 100 mL/hr at 07/20/19 0559          Aline August, MD [facility] 07/20/2019, 8:58 AM

## 2019-07-21 ENCOUNTER — Telehealth: Payer: Self-pay | Admitting: Family Medicine

## 2019-07-21 DIAGNOSIS — I82442 Acute embolism and thrombosis of left tibial vein: Secondary | ICD-10-CM

## 2019-07-21 DIAGNOSIS — G43811 Other migraine, intractable, with status migrainosus: Secondary | ICD-10-CM

## 2019-07-21 LAB — CBC WITH DIFFERENTIAL/PLATELET
Abs Immature Granulocytes: 0.04 10*3/uL (ref 0.00–0.07)
Basophils Absolute: 0.1 10*3/uL (ref 0.0–0.1)
Basophils Relative: 1 %
Eosinophils Absolute: 0.5 10*3/uL (ref 0.0–0.5)
Eosinophils Relative: 6 %
HCT: 39 % (ref 36.0–46.0)
Hemoglobin: 12.6 g/dL (ref 12.0–15.0)
Immature Granulocytes: 1 %
Lymphocytes Relative: 27 %
Lymphs Abs: 1.9 10*3/uL (ref 0.7–4.0)
MCH: 29.5 pg (ref 26.0–34.0)
MCHC: 32.3 g/dL (ref 30.0–36.0)
MCV: 91.3 fL (ref 80.0–100.0)
Monocytes Absolute: 0.6 10*3/uL (ref 0.1–1.0)
Monocytes Relative: 8 %
Neutro Abs: 4 10*3/uL (ref 1.7–7.7)
Neutrophils Relative %: 57 %
Platelets: 289 10*3/uL (ref 150–400)
RBC: 4.27 MIL/uL (ref 3.87–5.11)
RDW: 13.4 % (ref 11.5–15.5)
WBC: 7.1 10*3/uL (ref 4.0–10.5)
nRBC: 0 % (ref 0.0–0.2)

## 2019-07-21 LAB — GLUCOSE, CAPILLARY
Glucose-Capillary: 98 mg/dL (ref 70–99)
Glucose-Capillary: 112 mg/dL — ABNORMAL HIGH (ref 70–99)
Glucose-Capillary: 199 mg/dL — ABNORMAL HIGH (ref 70–99)
Glucose-Capillary: 124 mg/dL — ABNORMAL HIGH (ref 70–99)

## 2019-07-21 LAB — BASIC METABOLIC PANEL
Anion gap: 11 (ref 5–15)
BUN: 10 mg/dL (ref 8–23)
CO2: 21 mmol/L — ABNORMAL LOW (ref 22–32)
Calcium: 9 mg/dL (ref 8.9–10.3)
Chloride: 106 mmol/L (ref 98–111)
Creatinine, Ser: 1.59 mg/dL — ABNORMAL HIGH (ref 0.44–1.00)
GFR calc Af Amer: 36 mL/min — ABNORMAL LOW (ref >60)
GFR calc non Af Amer: 31 mL/min — ABNORMAL LOW (ref >60)
Glucose, Bld: 99 mg/dL (ref 70–99)
Potassium: 4.3 mmol/L (ref 3.5–5.1)
Sodium: 138 mmol/L (ref 135–145)

## 2019-07-21 LAB — MAGNESIUM: Magnesium: 2.1 mg/dL (ref 1.7–2.4)

## 2019-07-21 MED ORDER — SODIUM CHLORIDE 0.9 % IV BOLUS
500.0000 mL | Freq: Once | INTRAVENOUS | Status: AC
  Administered 2019-07-21: 500 mL via INTRAVENOUS

## 2019-07-21 MED ORDER — METHOCARBAMOL 1000 MG/10ML IJ SOLN
500.0000 mg | Freq: Three times a day (TID) | INTRAVENOUS | Status: AC
  Administered 2019-07-21 – 2019-07-22 (×3): 500 mg via INTRAVENOUS
  Filled 2019-07-21 (×3): qty 5

## 2019-07-21 MED ORDER — GABAPENTIN 300 MG PO CAPS
300.0000 mg | ORAL_CAPSULE | Freq: Two times a day (BID) | ORAL | Status: DC
  Administered 2019-07-21 – 2019-07-23 (×4): 300 mg via ORAL
  Filled 2019-07-21 (×4): qty 1

## 2019-07-21 MED ORDER — DIVALPROEX SODIUM 500 MG PO DR TAB
1000.0000 mg | DELAYED_RELEASE_TABLET | Freq: Once | ORAL | Status: AC
  Administered 2019-07-21: 1000 mg via ORAL
  Filled 2019-07-21: qty 2

## 2019-07-21 NOTE — Progress Notes (Addendum)
Patient sleeping and having short periods of sleep apnea. Held compazine this a.m. dose. Also sleeping a lot. R.N. aware

## 2019-07-21 NOTE — Chronic Care Management (AMB) (Signed)
  Chronic Care Management   Outreach Note  07/21/2019 Name: Maria Fox MRN: [id number] DOB: 1946/02/07  Referred by: [name], MD Reason for referral : Chronic Care Management   An unsuccessful telephone outreach was attempted today. The patient was referred to the pharmacist for assistance with care management and care coordination.   Follow Up Plan:   [place]

## 2019-07-21 NOTE — Progress Notes (Signed)
Subjective: Though headache continues to improve, still with some pain.   Exam: Vitals:   07/21/19 0733 07/21/19 0959  BP: (!) 104/50   Pulse: (!) 58 62  Resp: 17   Temp: (!) 97.5 F (36.4 C)   SpO2: 93%    Gen: In bed, NAD Resp: non-labored breathing, no acute distress Abd: soft, nt   Neuro:  MS: alert, awake, interactive, and appropriate, speech fluent without evidence of aphasia, able to follow commands without difficulty CN: pupils equal, round, and reactive, smile symmetric, facial light touch sensation normal bilaterally, hearing intact to conversation Motor: 5/5 strength, normal tone throughout, able to lift all 4 extremities anti-gravity with good strength Sensory: light touch intact throughout, bilaterally   Impression: 74 year old female with persistent headache with migrainous features in the setting of a history of migraines, most consistent with status migrainosus.  With her history of cerebrovascular disease, avoiding DHE. I think trying a different mechanism may be helpful, though I am loseing hope of getting her pain free prior to discharge.   She has taken gabapentin in the past for neuropathic pain, and was requesting it, this can also be useful for headaches and therefore I have started this.   Recommendations: 1) continue gabapentin , dcrease to 300mg  BID due to GFR.  2) Depakote 1g x1 3) robaxin 500mg  TID x 3 doses.   Roland Rack, MD [facility] [phone]  If 7pm- 7am, please page neurology on call as listed in [place].

## 2019-07-21 NOTE — Progress Notes (Signed)
PROGRESS NOTE    Maria Fox    Code Status: Full Code  [id number] DOB: 1945/05/02 DOA: 07/06/2019 LOS: 14 days  PCP: [name], MD CC:  Chief Complaint  Patient presents with  . Abdominal Pain       Hospital Summary   This is a 74 year old female with hypertension, hyperlipidemia, diabetes, CVA, CAD status post CABG who presented to the hospital with right sided abdominal pain 1 to 2 weeks prior to admission.  She was diagnosed with acute cholecystitis and started on IV antibiotics and subsequently underwent lap chole on 07/07/2019 with Dr. Ninfa Linden.  Postsurgical course was complicated by atrial flutter, chest pain respiratory distress and acute DVT with presumed PE.  She was treated with heparin and subsequently switched to oral Eliquis.  Cardiology was consulted and the patient was started on IV amiodarone.  Additionally, patient had intractable headache and neurology was consulted on 5/9 and was started on treatment for status migrainosus    A & P   Principal Problem:   Acute cholecystitis Active Problems:   Hypertension, essential   Hyperlipidemia associated with type 2 diabetes mellitus ([name])   DM (diabetes mellitus), type 2, uncontrolled w/neurologic complication ([name])   Chronic diastolic CHF (congestive heart failure) ([name])   Stage 3 chronic kidney disease due to type 2 diabetes mellitus ([place])   Coronary artery disease of native artery of native heart with stable angina pectoris ([name])   Atrial flutter with rapid ventricular response ([place])   1. Acute cholecystitis status post laparoscopic cholecystectomy on 4/28 with Dr. Ninfa Linden a. CT abdomen on 5/7 with p.o. contrast showing findings concerning for possible early tip appendicitis though this was reviewed by general surgery who thought it was not appendicitis b. GI consulted on 5/8 and recommended management of migraine to help with nausea and added Carafate continue to around-the-clock Zofran  c. Postop nausea  seems improved but still with decreased p.o. intake d. Advance diet as tolerated  2. Status migrainosus a. Neurology on board, avoiding DHE given CVA history.  May not be able to completely resolve her migraine prior to discharge.  Decrease gabapentin to 300 mg twice daily due to GFR, Depakote x1 and Robaxin 3 times daily x3 doses added b. Hopefully if this is improved tomorrow then she can be discharged  3. Acute left posterior tibial DVT /presumed but not confirmed PE given acute hypoxic respiratory failure a. New onset A-flutter on 5/1, D-dimer elevated and started on heparin empirically.  Bilateral lower extremity Dopplers obtained showing acute DVT.  Unfortunately patient has a contrast allergy and CTA was held.  2D echo without RV strain b. Started on Eliquis 5/3 and will require 3 to 6 months anticoagulation for this  4. Chest pain, atrial flutter a. Cardiology consulted and started on IV amiodarone and continued on beta-blockers.  Recommended continuing this therapy until discharge and outpatient follow-up b. Previous hospitalist discontinued IV amiodarone given intermittent nausea and vomiting on 5/5 and patient has been tolerating  5. Acute hypoxic respiratory failure, unknown etiology a. Previous concern for unproven PE without RV strain on echo b. Currently tolerating room air  6. Diabetes  a. Continue Lantus  7. Hyperlipidemia on atorvastatin  8. CAD with history of CABG a. Stable  9. HFpEF, not in acute exacerbation  10. AKI on CKD 3 b a. Possibly from poor p.o. intake b. Creatinine 1.59, baseline 1.1-1.3 c. Will give gentle IV fluids today and follow-up in a.m. d. Renally dose medications  DVT prophylaxis: Eliquis Family Communication: Patient updated at bedside Disposition Plan:  Status is: Inpatient  Remains inpatient appropriate because:IV fluids for AKI and poor p.o. intake.  Needs to tolerate advancement of diet prior to discharge   Dispo: The  patient is from: Home              Anticipated d/c is to: Home              Anticipated d/c date is: 1 day              Patient currently is not medically stable to d/c.           Pressure injury documentation    None  Consultants  General surgery Cardiology GI Neurology   Procedures  Laparoscopic cholecystectomy on 07/07/2019   Antibiotics   Anti-infectives (From admission, onward)   Start     Dose/Rate Route Frequency Ordered Stop   07/08/19 0500  cefTRIAXone (ROCEPHIN) 2 g in sodium chloride 0.9 % 100 mL IVPB  Status:  Discontinued     2 g 200 mL/hr over 30 Minutes Intravenous Every 24 hours 07/07/19 0839 07/07/19 1802   07/07/19 0245  cefTRIAXone (ROCEPHIN) 2 g in sodium chloride 0.9 % 100 mL IVPB     2 g 200 mL/hr over 30 Minutes Intravenous  Once 07/07/19 0231 07/07/19 0451        Subjective   Seen and examined lying flat resting comfortably in the dark.  Reports that she has some appetite today but has not eaten much yet.  Still with headache.  No other issues.  Objective   Vitals:   07/21/19 0500 07/21/19 0733 07/21/19 0959 07/21/19 1058  BP:  (!) 104/50  120/68  Pulse: 61 (!) 58 62 66  Resp: 18 17  19   Temp:  (!) 97.5 F (36.4 C)  98.1 F (36.7 C)  TempSrc:  Oral  Oral  SpO2: 93% 93%  97%  Weight: 66.2 kg     Height:        Intake/Output Summary (Last 24 hours) at 07/21/2019 1610 Last data filed at 07/21/2019 1500 Gross per 24 hour  Intake 170 ml  Output 800 ml  Net -630 ml   Filed Weights   07/19/19 0555 07/20/19 0428 07/21/19 0500  Weight: 66.3 kg 67.4 kg 66.2 kg    Examination:  Physical Exam Vitals and nursing note reviewed.  Constitutional:      Appearance: Normal appearance.  HENT:     Head: Normocephalic and atraumatic.  Eyes:     Conjunctiva/sclera: Conjunctivae normal.  Cardiovascular:     Rate and Rhythm: Normal rate and regular rhythm.  Pulmonary:     Effort: Pulmonary effort is normal.     Breath sounds: Normal  breath sounds.  Abdominal:     General: Abdomen is flat.     Palpations: Abdomen is soft.  Musculoskeletal:        General: No swelling or tenderness.  Skin:    Coloration: Skin is not jaundiced or pale.  Neurological:     Mental Status: She is alert. Mental status is at baseline.  Psychiatric:        Mood and Affect: Mood normal.        Behavior: Behavior normal.     Data Reviewed: I have personally reviewed following labs and imaging studies  CBC: Recent Labs  Lab 07/17/19 0436 07/18/19 0228 07/19/19 0250 07/20/19 0306 07/21/19 0340  WBC 5.4 6.3 6.4 6.4 7.1  NEUTROABS 3.0 3.5 3.6 3.4 4.0  HGB 12.4 12.9 12.9 12.0 12.6  HCT 37.9 39.5 39.6 36.5 39.0  MCV 90.5 91.6 90.0 90.6 91.3  PLT 266 255 286 254 [id number]   Basic Metabolic Panel: Recent Labs  Lab 07/17/19 0436 07/18/19 0228 07/19/19 0250 07/20/19 0306 07/21/19 0340  NA 139 137 138 137 138  K 4.4 5.5* 4.9 4.4 4.3  CL 104 105 105 108 106  CO2 26 23 23 22  21*  GLUCOSE 113* 121* 143* 112* 99  BUN 10 8 10 11 10   CREATININE 1.51* 1.43* 1.68* 1.42* 1.59*  CALCIUM 8.7* 9.1 9.2 8.4* 9.0  MG 1.9 2.0 1.8 2.3 2.1   GFR: Estimated Creatinine Clearance: 26.8 mL/min (A) (by C-G formula based on SCr of 1.59 mg/dL (H)). Liver Function Tests: Recent Labs  Lab 07/15/19 0237 07/16/19 0310 07/17/19 0436 07/19/19 0250  AST 28 22 21 28   ALT 27 22 20 20   ALKPHOS 75 71 69 83  BILITOT 0.5 0.6 0.7 0.7  PROT 6.0* 5.7* 5.7* 6.2*  ALBUMIN 3.1* 2.9* 2.8* 3.2*   Recent Labs  Lab 07/15/19 0237  LIPASE 18   No results for input(s): AMMONIA in the last 168 hours. Coagulation Profile: No results for input(s): INR, PROTIME in the last 168 hours. Cardiac Enzymes: No results for input(s): CKTOTAL, CKMB, CKMBINDEX, TROPONINI in the last 168 hours. BNP (last 3 results) No results for input(s): PROBNP in the last 8760 hours. HbA1C: No results for input(s): HGBA1C in the last 72 hours. CBG: Recent Labs  Lab 07/20/19 1146  07/20/19 1607 07/20/19 2125 07/21/19 0615 07/21/19 1101  GLUCAP 112* 128* 129* 98 112*   Lipid Profile: No results for input(s): CHOL, HDL, LDLCALC, TRIG, CHOLHDL, LDLDIRECT in the last 72 hours. Thyroid Function Tests: No results for input(s): TSH, T4TOTAL, FREET4, T3FREE, THYROIDAB in the last 72 hours. Anemia Panel: No results for input(s): VITAMINB12, FOLATE, FERRITIN, TIBC, IRON, RETICCTPCT in the last 72 hours. Sepsis Labs: No results for input(s): PROCALCITON, LATICACIDVEN in the last 168 hours.  No results found for this or any previous visit (from the past 240 hour(s)).       Radiology Studies: No results found.      Scheduled Meds: . apixaban  5 mg Oral BID  . atorvastatin  40 mg Oral Daily  . clopidogrel  75 mg Oral Daily  . famotidine  20 mg Oral BID  . feeding supplement (ENSURE ENLIVE)  237 mL Oral TID BM  . gabapentin  300 mg Oral BID  . insulin aspart  0-15 Units Subcutaneous TID WC  . insulin aspart  0-5 Units Subcutaneous QHS  . insulin glargine  5 Units Subcutaneous Daily  . isosorbide mononitrate  15 mg Oral Daily  . latanoprost  1 drop Both Eyes QHS  . metoprolol tartrate  12.5 mg Oral BID  . multivitamin with minerals  1 tablet Oral Daily  . ondansetron (ZOFRAN) IV  4 mg Intravenous Q6H  . pantoprazole (PROTONIX) IV  40 mg Intravenous Q12H  . polyethylene glycol  17 g Oral Daily  . senna-docusate  1 tablet Oral BID  . sucralfate  1 g Oral TID WC & HS   Continuous Infusions: . methocarbamol (ROBAXIN) IV Stopped (07/21/19 1427)     Time spent: 30 minutes with over 50% of the time coordinating the patient's care    Harold Hedge, DO [facility] Pager [phone]  Call night coverage person covering after 7pm

## 2019-07-22 DIAGNOSIS — G43011 Migraine without aura, intractable, with status migrainosus: Secondary | ICD-10-CM

## 2019-07-22 DIAGNOSIS — N390 Urinary tract infection, site not specified: Secondary | ICD-10-CM

## 2019-07-22 LAB — URINALYSIS, ROUTINE W REFLEX MICROSCOPIC
Bilirubin Urine: NEGATIVE
Glucose, UA: NEGATIVE mg/dL
Hgb urine dipstick: NEGATIVE
Ketones, ur: 5 mg/dL — AB
Nitrite: POSITIVE — AB
Protein, ur: NEGATIVE mg/dL
Specific Gravity, Urine: 1.017 (ref 1.005–1.030)
pH: 5 (ref 5.0–8.0)

## 2019-07-22 LAB — CBC
HCT: 38 % (ref 36.0–46.0)
Hemoglobin: 12.4 g/dL (ref 12.0–15.0)
MCH: 29.7 pg (ref 26.0–34.0)
MCHC: 32.6 g/dL (ref 30.0–36.0)
MCV: 91.1 fL (ref 80.0–100.0)
Platelets: 287 10*3/uL (ref 150–400)
RBC: 4.17 MIL/uL (ref 3.87–5.11)
RDW: 13.3 % (ref 11.5–15.5)
WBC: 7.2 10*3/uL (ref 4.0–10.5)
nRBC: 0 % (ref 0.0–0.2)

## 2019-07-22 LAB — GLUCOSE, CAPILLARY
Glucose-Capillary: 130 mg/dL — ABNORMAL HIGH (ref 70–99)
Glucose-Capillary: 223 mg/dL — ABNORMAL HIGH (ref 70–99)
Glucose-Capillary: 122 mg/dL — ABNORMAL HIGH (ref 70–99)
Glucose-Capillary: 138 mg/dL — ABNORMAL HIGH (ref 70–99)

## 2019-07-22 LAB — BASIC METABOLIC PANEL
Anion gap: 8 (ref 5–15)
BUN: 11 mg/dL (ref 8–23)
CO2: 23 mmol/L (ref 22–32)
Calcium: 9 mg/dL (ref 8.9–10.3)
Chloride: 108 mmol/L (ref 98–111)
Creatinine, Ser: 1.61 mg/dL — ABNORMAL HIGH (ref 0.44–1.00)
GFR calc Af Amer: 36 mL/min — ABNORMAL LOW (ref >60)
GFR calc non Af Amer: 31 mL/min — ABNORMAL LOW (ref >60)
Glucose, Bld: 123 mg/dL — ABNORMAL HIGH (ref 70–99)
Potassium: 4.4 mmol/L (ref 3.5–5.1)
Sodium: 139 mmol/L (ref 135–145)

## 2019-07-22 LAB — MAGNESIUM: Magnesium: 2 mg/dL (ref 1.7–2.4)

## 2019-07-22 MED ORDER — PROCHLORPERAZINE EDISYLATE 10 MG/2ML IJ SOLN
10.0000 mg | Freq: Every day | INTRAMUSCULAR | Status: AC
  Administered 2019-07-22: 10 mg via INTRAVENOUS
  Filled 2019-07-22: qty 2

## 2019-07-22 MED ORDER — CEPHALEXIN 500 MG PO CAPS: 500.0000 mg | ORAL_CAPSULE | Freq: Two times a day (BID) | ORAL | Status: DC

## 2019-07-22 MED ORDER — CEPHALEXIN 500 MG PO CAPS
500.0000 mg | ORAL_CAPSULE | Freq: Two times a day (BID) | ORAL | Status: DC
  Administered 2019-07-22 – 2019-07-23 (×2): 500 mg via ORAL
  Filled 2019-07-22 (×2): qty 1

## 2019-07-22 MED ORDER — CIPROFLOXACIN HCL 500 MG PO TABS: 250.0000 mg | ORAL_TABLET | Freq: Two times a day (BID) | ORAL | Status: DC

## 2019-07-22 NOTE — TOC Initial Note (Addendum)
Transition of Care [facility]) - Initial/Assessment Note    Patient Details  Name: Maria Fox MRN: [id number] Date of Birth: 1945-07-23  Transition of Care [facility]) CM/SW Contact:    Verdell Carmine, RN Phone Number: 07/22/2019, 10:02 AM  Clinical Narrative:                 Patient admitted for acute cholecystitis.  Lap choley done on the 28th. Complicated by new onset atrial fibrillation with RVR. Had no needs for home initially now requesting a cane for home use. [employer] notified and cane delivered. Patient denies need for any home health services. "My husband takes great care of me" No further needs.  Expected Discharge Plan: Home/Self Care Barriers to Discharge: No Barriers Identified   Patient Goals and CMS Choice Patient states their goals for this hospitalization and ongoing recovery are:: GO home would like a 4 pronged cane   Choice offered to / list presented to : Patient  Expected Discharge Plan and Services Expected Discharge Plan: Home/Self Care     Post Acute Care Choice: Durable Medical Equipment Living arrangements for the past 2 months: Single Family Home                 DME Arranged: Kasandra Knudsen DME Agency: [employer] Date DME Agency Contacted: 07/22/19 Time DME Agency Contacted: [phone] Representative spoke with at DME Agency: Andree Coss            Prior Living Arrangements/Services Living arrangements for the past 2 months: [place] Lives with:: Spouse Patient language and need for interpreter reviewed:: Yes Do you feel safe going back to the place where you live?: Yes      Need for Family Participation in Patient Care: Yes (Comment) Care giver support system in place?: Yes (comment)   Criminal Activity/Legal Involvement Pertinent to Current Situation/Hospitalization: No - Comment as needed  Activities of Daily Living Home Assistive Devices/Equipment: Eyeglasses ADL Screening (condition at time of admission) Patient's cognitive ability adequate to  safely complete daily activities?: Yes Is the patient deaf or have difficulty hearing?: No Does the patient have difficulty seeing, even when wearing glasses/contacts?: No Does the patient have difficulty concentrating, remembering, or making decisions?: No Patient able to express need for assistance with ADLs?: Yes Does the patient have difficulty dressing or bathing?: No Independently performs ADLs?: Yes (appropriate for developmental age) Does the patient have difficulty walking or climbing stairs?: Yes Weakness of Legs: Both Weakness of Arms/Hands: None  Permission Sought/Granted   Permission granted to share information with : Yes, Verbal Permission Granted     Permission granted to share info w AGENCY: [employer]        Emotional Assessment Appearance:: Appears stated age Attitude/Demeanor/Rapport: Engaged Affect (typically observed): Pleasant Orientation: : Oriented to Self, Oriented to Place, Oriented to  Time, Oriented to Situation Alcohol / Substance Use: Tobacco Use Psych Involvement: No (comment)  Admission diagnosis:  Acute cholecystitis [K81.0] Cholecystitis [K81.9] Hyperglycemia [R73.9] Urinary tract infection without hematuria, site unspecified [N39.0] Patient Active Problem List   Diagnosis Date Noted  . Atrial flutter with rapid ventricular response ([place])   . Acute cholecystitis 07/07/2019  . Pain of right breast 03/15/2019  . Health maintenance examination 12/11/2018  . Type 2 diabetes mellitus with severe nonproliferative diabetic retinopathy with macular edema, bilateral ([place]) 07/16/2018  . Coronary artery disease of native artery of native heart with stable angina pectoris ([place])   . Effort angina ([place]) 11/12/2017  . Abnormal stress ECG   .  Dyspnea on exertion 11/04/2017  . GERD (gastroesophageal reflux disease) 08/15/2017  . Trigger finger 06/09/2017  . Nocturnal leg movements 03/25/2017  . Osteopenia 02/01/2017  . Advanced care planning/counseling  discussion 01/02/2017  . Stage 3 chronic kidney disease due to type 2 diabetes mellitus ([place]) 08/26/2016  . Insomnia 08/26/2016  . Vitamin B12 deficiency 11/10/2015  . General weakness 08/04/2015  . Epistaxis 07/07/2015  . Fatty liver disease, nonalcoholic [id number]  . Chronic diastolic CHF (congestive heart failure) ([place]) 08/27/2014  . Positive hepatitis C antibody test 08/27/2014  . Iron deficiency 08/27/2014  . NSVT (nonsustained ventricular tachycardia) ([place])   . Nausea without vomiting   . Autonomic orthostatic hypotension 07/11/2014  . S/P CABG x 3 07/08/2014  . History of cerebrovascular accident (CVA) in adulthood 07/08/2014  . DM (diabetes mellitus), type 2, uncontrolled w/neurologic complication ([place]) 0000000  . Itching 03/16/2012  . Rash 03/16/2012  . Medicare annual wellness visit, initial 10/28/2011  . Hyperlipidemia associated with type 2 diabetes mellitus ([place])   . Idiopathic angioedema 06/23/2011  . Vitiligo 06/23/2011  . Dermatitis 06/23/2011  . MDD (major depressive disorder), recurrent severe, without psychosis ([place]) 05/08/2006  . Hypertension, essential 05/08/2006  . MENOPAUSAL SYNDROME 05/08/2006   PCP:  [name], MD Pharmacy:   [place], [place] - [address] [place] [address] Phone: [phone] Fax: [phone]  [id number] - [place], [place] [id number] [place] [id number] Phone: [phone] Fax: [phone]  [facility] [employer], [place] - [address] [place] [address] Phone: [phone] Fax: [phone]     Social Determinants of Health (SDOH) Interventions    Readmission Risk Interventions No flowsheet data found.

## 2019-07-22 NOTE — Progress Notes (Signed)
Subjective: Headache continues to improve, though she still does have some pain  Exam: Vitals:   07/22/19 1155 07/22/19 1655  BP: (!) 143/66 123/72  Pulse: 60 61  Resp: 17 (!) 23  Temp: 97.6 F (36.4 C) 97.7 F (36.5 C)  SpO2: 98% 95%   Gen: In bed, NAD Resp: non-labored breathing, no acute distress Abd: soft, nt  Neuro: MS: alert, awake, interactive, and appropriate, speech fluent without evidence of aphasia, able to follow commands without difficulty  Impression: 74 year old female with persistent headache with migrainous features in the setting of a history of migraines, most consistent with status migrainosus.  With her history of cerebrovascular disease, avoiding DHE. I think trying a different mechanism may be helpful, though I am loseing hope of getting her pain free prior to discharge.   She has taken gabapentin in the past for neuropathic pain, and was requesting it, this can also be useful for headaches and therefore I have started this.   Recommendations:  1) Compazine tonight 2) gabapentin 300 twice daily, she will remain on this and can follow-up as outpatient with neurology. 3) please call with further questions or concerns.   Roland Rack, MD [facility] [phone]  If 7pm- 7am, please page neurology on call as listed in [place].

## 2019-07-22 NOTE — Progress Notes (Addendum)
PROGRESS NOTE    Maria Fox    Code Status: Full Code  [name] DOB: 10-29-1945 DOA: 07/06/2019 LOS: 15 days  PCP: [name], MD CC:  Chief Complaint  Patient presents with  . Abdominal Pain       Hospital Summary   This is a 74 year old female with hypertension, hyperlipidemia, diabetes, CVA, CAD status post CABG who presented to the hospital with right sided abdominal pain 1 to 2 weeks prior to admission.  She was diagnosed with acute cholecystitis and started on IV antibiotics and subsequently underwent lap chole on 07/07/2019 with Dr. Ninfa Linden.  Postsurgical course was complicated by atrial flutter, chest pain respiratory distress and acute DVT with presumed PE.  She was treated with heparin and subsequently switched to oral Eliquis.  Cardiology was consulted and the patient was started on IV amiodarone.  Additionally, patient had intractable headache and neurology was consulted on 5/9 and was started on treatment for status migrainosus  Plan was to be discharged on 5/13 however unfortunately the patient did not have transportation home and her husband who was out of town had the keys to the house.  He will be back in town on 5/14.  A & P   Principal Problem:   Acute cholecystitis Active Problems:   Hypertension, essential   Hyperlipidemia associated with type 2 diabetes mellitus ([name])   DM (diabetes mellitus), type 2, uncontrolled w/neurologic complication ([name])   Chronic diastolic CHF (congestive heart failure) ([name])   Stage 3 chronic kidney disease due to type 2 diabetes mellitus ([place])   Coronary artery disease of native artery of native heart with stable angina pectoris ([name])   Atrial flutter with rapid ventricular response ([place])   1. Acute cholecystitis status post laparoscopic cholecystectomy on 4/28 with Dr. Ninfa Linden a. CT abdomen on 5/7 with p.o. contrast showing findings concerning for possible early tip appendicitis though this was reviewed by general  surgery who thought it was not appendicitis b. GI consulted on 5/8 and recommended management of migraine to help with nausea and added Carafate continue to around-the-clock Zofran  c. Postop nausea resolved  2. Status migrainosus improved today a. Neurology on board, avoiding DHE given CVA history.  Compazine tonight, continue gabapentin 300 mg twice daily and continue at discharge with outpatient follow-up with neurology  3. Acute left posterior tibial DVT /presumed but not confirmed PE given acute hypoxic respiratory failure a. New onset A-flutter on 5/1, D-dimer elevated and started on heparin empirically.  Bilateral lower extremity Dopplers obtained showing acute DVT.  Unfortunately patient has a contrast allergy and CTA was held.  2D echo without RV strain b. Started on Eliquis 5/3 and will require 3 to 6 months anticoagulation for this  4. Chest pain, atrial flutter rate controlled and stable a. Cardiology consulted and started on IV amiodarone and continued on beta-blockers.  Recommended continuing this therapy until discharge and outpatient follow-up b. Previous hospitalist discontinued IV amiodarone given intermittent nausea and vomiting on 5/5 and patient has been tolerating  5. Acute hypoxic respiratory failure, unknown etiology -resolved a. Previous concern for unproven PE without RV strain on echo  b. Currently tolerating room air  6. Diabetes  a. Continue Lantus  7. Hyperlipidemia on atorvastatin  8. CAD with history of CABG a. Stable  9. HFpEF, not in acute exacerbation  10. AKI on CKD 3 b a. Possibly from poor p.o. intake b. Creatinine 1.61, increased from yesterday, baseline 1.1-1.3 c. Had CT abdomen without contrast on 5/7  without concerns for hydronephrosis or stones d. UA ordered and showing UTI -will start on oral treatment e. Renally dose medications  11. UTI with history of Klebsiella pneumonia UTI a. Asymptomatic but with AKI b. Klebsiella UTI in 2017  resistant to ampicillin and nitrofurantoin.  Patient has a history of severe allergy to penicillins and adverse effects to sulfa antibiotics prohibiting penicillins, cephalosporins and Bactrim c. Will start on Cipro 250 mg p.o. twice daily x3 days i. Addendum: notified by pharmacy that the patient has tolerated cephalosporins in the past. Will change Cipro to Keflex d. Obtain urine culture and start on p.o. antibiotics    DVT prophylaxis: Eliquis Family Communication: Patient updated at bedside Disposition Plan: Plan was initially to discharge home today however patient's husband out of town for [place] appointment will not be back until tomorrow.  Patient does not have a way to get home and does not have the keys to the house and so she will remain inpatient likely until tomorrow.  Status is: Inpatient  Remains inpatient appropriate because:Unsafe d/c plan   Dispo: The patient is from: Home              Anticipated d/c is to: Home              Anticipated d/c date is: 1 day              Patient currently is medically stable to d/c.      Pressure injury documentation    None  Consultants  General surgery Cardiology GI Neurology   Procedures  Laparoscopic cholecystectomy on 07/07/2019   Antibiotics   Anti-infectives (From admission, onward)   Start     Dose/Rate Route Frequency Ordered Stop   07/08/19 0500  cefTRIAXone (ROCEPHIN) 2 g in sodium chloride 0.9 % 100 mL IVPB  Status:  Discontinued     2 g 200 mL/hr over 30 Minutes Intravenous Every 24 hours 07/07/19 0839 07/07/19 1802   07/07/19 0245  cefTRIAXone (ROCEPHIN) 2 g in sodium chloride 0.9 % 100 mL IVPB     2 g 200 mL/hr over 30 Minutes Intravenous  Once 07/07/19 0231 07/07/19 0451        Subjective   Reports that her symptoms of headache are improved.  Is tolerating her diet and does not have any complaints.  Objective   Vitals:   07/22/19 0442 07/22/19 0840 07/22/19 1155 07/22/19 1655  BP: (!) 111/57  132/85 (!) 143/66 123/72  Pulse: 64 85 60 61  Resp: 13 (!) 25 17 (!) 23  Temp: (!) 97.5 F (36.4 C) 97.6 F (36.4 C) 97.6 F (36.4 C) 97.7 F (36.5 C)  TempSrc: Oral Oral Oral Oral  SpO2: 94% 99% 98% 95%  Weight: 69.1 kg     Height:        Intake/Output Summary (Last 24 hours) at 07/22/2019 1737 Last data filed at 07/21/2019 2200 Gross per 24 hour  Intake 740.45 ml  Output --  Net 740.45 ml   Filed Weights   07/20/19 0428 07/21/19 0500 07/22/19 0442  Weight: 67.4 kg 66.2 kg 69.1 kg    Examination:  Physical Exam Vitals and nursing note reviewed.  Constitutional:      Appearance: Normal appearance.  HENT:     Head: Normocephalic and atraumatic.  Eyes:     Conjunctiva/sclera: Conjunctivae normal.  Cardiovascular:     Rate and Rhythm: Normal rate and regular rhythm.  Pulmonary:     Effort: Pulmonary effort is normal.  Breath sounds: Normal breath sounds.  Abdominal:     General: Abdomen is flat.     Palpations: Abdomen is soft.  Musculoskeletal:        General: No swelling or tenderness.  Skin:    Coloration: Skin is not jaundiced or pale.  Neurological:     Mental Status: She is alert. Mental status is at baseline.  Psychiatric:        Mood and Affect: Mood normal.        Behavior: Behavior normal.     Data Reviewed: I have personally reviewed following labs and imaging studies  CBC: Recent Labs  Lab 07/17/19 0436 07/17/19 0436 07/18/19 0228 07/19/19 0250 07/20/19 0306 07/21/19 0340 07/22/19 0310  WBC 5.4   < > 6.3 6.4 6.4 7.1 7.2  NEUTROABS 3.0  --  3.5 3.6 3.4 4.0  --   HGB 12.4   < > 12.9 12.9 12.0 12.6 12.4  HCT 37.9   < > 39.5 39.6 36.5 39.0 38.0  MCV 90.5   < > 91.6 90.0 90.6 91.3 91.1  PLT 266   < > 255 286 254 289 287   < > = values in this interval not displayed.   Basic Metabolic Panel: Recent Labs  Lab 07/18/19 0228 07/19/19 0250 07/20/19 0306 07/21/19 0340 07/22/19 0310  NA 137 138 137 138 139  K 5.5* 4.9 4.4 4.3 4.4  CL  105 105 108 106 108  CO2 23 23 22  21* 23  GLUCOSE 121* 143* 112* 99 123*  BUN 8 10 11 10 11   CREATININE 1.43* 1.68* 1.42* 1.59* 1.61*  CALCIUM 9.1 9.2 8.4* 9.0 9.0  MG 2.0 1.8 2.3 2.1 2.0   GFR: Estimated Creatinine Clearance: 27.1 mL/min (A) (by C-G formula based on SCr of 1.61 mg/dL (H)). Liver Function Tests: Recent Labs  Lab 07/16/19 0310 07/17/19 0436 07/19/19 0250  AST 22 21 28   ALT 22 20 20   ALKPHOS 71 69 83  BILITOT 0.6 0.7 0.7  PROT 5.7* 5.7* 6.2*  ALBUMIN 2.9* 2.8* 3.2*   No results for input(s): LIPASE, AMYLASE in the last 168 hours. No results for input(s): AMMONIA in the last 168 hours. Coagulation Profile: No results for input(s): INR, PROTIME in the last 168 hours. Cardiac Enzymes: No results for input(s): CKTOTAL, CKMB, CKMBINDEX, TROPONINI in the last 168 hours. BNP (last 3 results) No results for input(s): PROBNP in the last 8760 hours. HbA1C: No results for input(s): HGBA1C in the last 72 hours. CBG: Recent Labs  Lab 07/21/19 1615 07/21/19 2126 07/22/19 0619 07/22/19 1154 07/22/19 1653  GLUCAP 199* 124* 130* 223* 122*   Lipid Profile: No results for input(s): CHOL, HDL, LDLCALC, TRIG, CHOLHDL, LDLDIRECT in the last 72 hours. Thyroid Function Tests: No results for input(s): TSH, T4TOTAL, FREET4, T3FREE, THYROIDAB in the last 72 hours. Anemia Panel: No results for input(s): VITAMINB12, FOLATE, FERRITIN, TIBC, IRON, RETICCTPCT in the last 72 hours. Sepsis Labs: No results for input(s): PROCALCITON, LATICACIDVEN in the last 168 hours.  No results found for this or any previous visit (from the past 240 hour(s)).       Radiology Studies: No results found.      Scheduled Meds: . apixaban  5 mg Oral BID  . atorvastatin  40 mg Oral Daily  . clopidogrel  75 mg Oral Daily  . famotidine  20 mg Oral BID  . feeding supplement (ENSURE ENLIVE)  237 mL Oral TID BM  . gabapentin  300 mg Oral  BID  . insulin aspart  0-15 Units Subcutaneous TID WC   . insulin aspart  0-5 Units Subcutaneous QHS  . insulin glargine  5 Units Subcutaneous Daily  . isosorbide mononitrate  15 mg Oral Daily  . latanoprost  1 drop Both Eyes QHS  . metoprolol tartrate  12.5 mg Oral BID  . multivitamin with minerals  1 tablet Oral Daily  . ondansetron (ZOFRAN) IV  4 mg Intravenous Q6H  . pantoprazole (PROTONIX) IV  40 mg Intravenous Q12H  . polyethylene glycol  17 g Oral Daily  . prochlorperazine  10 mg Intravenous QHS  . senna-docusate  1 tablet Oral BID  . sucralfate  1 g Oral TID WC & HS   Continuous Infusions:    Time spent: 25 minutes with over 50% of the time coordinating the patient's care    Harold Hedge, DO [facility] Pager [phone]  Call night coverage person covering after 7pm

## 2019-07-22 NOTE — Plan of Care (Signed)
Continue to monitor

## 2019-07-22 NOTE — Progress Notes (Signed)
Physical Therapy Treatment Patient Details Name: Maria Fox MRN: [id number] DOB: 01-Aug-1945 Today's Date: 07/22/2019    History of Present Illness Pt is 74 yo female with HTN, HLD, DM, history of CVA, CAD status post CABG, came into the hospital with abdominal pain, mainly on the right side which started about 1 to 2 weeks ago.  She was diagnosed with acute cholecystitis and is s/p lap chole on 4/28. Postoperative course was complicated by development of a flutter, chest pain, respiratory distress and acute DVT /presumed PE    PT Comments    Pt continues to feel better and is mobilizing more.    Follow Up Recommendations  Home health PT     Equipment Recommendations  None recommended by PT    Recommendations for Other Services       Precautions / Restrictions Precautions Precautions: None    Mobility  Bed Mobility Overal bed mobility: Modified Independent Bed Mobility: Supine to Sit;Sit to Supine     Supine to sit: Modified independent (Device/Increase time);HOB elevated Sit to supine: Modified independent (Device/Increase time);HOB elevated      Transfers Overall transfer level: Modified independent Equipment used: None;Rolling walker (2 wheeled) Transfers: Sit to/from [facility] Sit to Stand: Modified independent (Device/Increase time) Stand pivot transfers: Modified independent (Device/Increase time)          Ambulation/Gait Ambulation/Gait assistance: Supervision Gait Distance (Feet): 400 Feet Assistive device: Rolling walker (2 wheeled) Gait Pattern/deviations: Step-through pattern;Decreased stride length Gait velocity: close to normal Gait velocity interpretation: >2.62 ft/sec, indicative of community ambulatory General Gait Details: slight rt drift with rolling walker likely due to walker itself   Stairs             Wheelchair Mobility    Modified Rankin (Stroke Patients Only)       Balance Overall balance assessment:  Needs assistance Sitting-balance support: No upper extremity supported;Feet supported Sitting balance-Leahy Scale: Good     Standing balance support: No upper extremity supported Standing balance-Leahy Scale: Fair                              Cognition Arousal/Alertness: Awake/alert Behavior During Therapy: WFL for tasks assessed/performed Overall Cognitive Status: Within Functional Limits for tasks assessed                                        Exercises      General Comments General comments (skin integrity, edema, etc.): VSS      Pertinent Vitals/Pain Pain Assessment: No/denies pain    Home Living                      Prior Function            PT Goals (current goals can now be found in the care plan section) Acute Rehab PT Goals Patient Stated Goal: return home Progress towards PT goals: Progressing toward goals    Frequency    Min 3X/week      PT Plan Current plan remains appropriate    Co-evaluation              AM-PAC PT "6 Clicks" Mobility   Outcome Measure  Help needed turning from your back to your side while in a flat bed without using bedrails?: None Help needed moving from lying on your back  to sitting on the side of a flat bed without using bedrails?: None Help needed moving to and from a bed to a chair (including a wheelchair)?: None Help needed standing up from a chair using your arms (e.g., wheelchair or bedside chair)?: None Help needed to walk in hospital room?: A Little Help needed climbing 3-5 steps with a railing? : A Little 6 Click Score: 22    End of Session   Activity Tolerance: Patient tolerated treatment well Patient left: in bed;with call bell/phone within reach Nurse Communication: Mobility status PT Visit Diagnosis: Unsteadiness on feet (R26.81);Muscle weakness (generalized) (M62.81)     Time: [id number] PT Time Calculation (min) (ACUTE ONLY): 10 min  Charges:  $Gait  Training: 8-22 mins                     [place] Pager [phone] Office [place] 07/22/2019, 9:55 AM

## 2019-07-22 NOTE — TOC Progression Note (Addendum)
Transition of Care [facility]) - Progression Note    Patient Details  Name: Maria Fox MRN: [id number] Date of Birth: 02-20-1946  [facility]) CM/SW [place], RN Phone Number: 07/22/2019, 1:16 PM  Clinical Narrative:    Patient husband is out of town for a doctors appointment and will not be back until tomorrow. She has no way of getting in her home, as he has the key. She states  He can pick her up tomorrow. Left voice message for husband to call  . Dr. Neysa Bonito aware of issue.     Expected Discharge Plan: Home/Self Care Barriers to Discharge: No Barriers Identified  Expected Discharge Plan and Services Expected Discharge Plan: Home/Self Care     Post Acute Care Choice: Durable Medical Equipment Living arrangements for the past 2 months: Single Family Home                 DME Arranged: Kasandra Knudsen DME Agency: [employer] Date DME Agency Contacted: 07/22/19 Time DME Agency Contacted: [phone] Representative spoke with at DME Agency: [place] ([place]) Interventions    Readmission Risk Interventions No flowsheet data found.

## 2019-07-23 ENCOUNTER — Telehealth: Payer: Self-pay | Admitting: Family Medicine

## 2019-07-23 ENCOUNTER — Telehealth: Payer: Medicare HMO

## 2019-07-23 LAB — CBC
HCT: 37.7 % (ref 36.0–46.0)
Hemoglobin: 12.1 g/dL (ref 12.0–15.0)
MCH: 29.5 pg (ref 26.0–34.0)
MCHC: 32.1 g/dL (ref 30.0–36.0)
MCV: 92 fL (ref 80.0–100.0)
Platelets: 264 10*3/uL (ref 150–400)
RBC: 4.1 MIL/uL (ref 3.87–5.11)
RDW: 13.4 % (ref 11.5–15.5)
WBC: 5.9 10*3/uL (ref 4.0–10.5)
nRBC: 0 % (ref 0.0–0.2)

## 2019-07-23 LAB — GLUCOSE, CAPILLARY
Glucose-Capillary: 133 mg/dL — ABNORMAL HIGH (ref 70–99)
Glucose-Capillary: 195 mg/dL — ABNORMAL HIGH (ref 70–99)

## 2019-07-23 LAB — BASIC METABOLIC PANEL
Anion gap: 10 (ref 5–15)
BUN: 12 mg/dL (ref 8–23)
CO2: 22 mmol/L (ref 22–32)
Calcium: 9.1 mg/dL (ref 8.9–10.3)
Chloride: 105 mmol/L (ref 98–111)
Creatinine, Ser: 1.41 mg/dL — ABNORMAL HIGH (ref 0.44–1.00)
GFR calc Af Amer: 42 mL/min — ABNORMAL LOW (ref >60)
GFR calc non Af Amer: 36 mL/min — ABNORMAL LOW (ref >60)
Glucose, Bld: 143 mg/dL — ABNORMAL HIGH (ref 70–99)
Potassium: 4.3 mmol/L (ref 3.5–5.1)
Sodium: 137 mmol/L (ref 135–145)

## 2019-07-23 MED ORDER — GABAPENTIN 300 MG PO CAPS: 300.0000 mg | ORAL_CAPSULE | Freq: Two times a day (BID) | ORAL | 1 refills | Status: DC

## 2019-07-23 MED ORDER — GLIPIZIDE 5 MG PO TABS: ORAL_TABLET | ORAL | 1 refills | Status: DC

## 2019-07-23 MED ORDER — CEPHALEXIN 500 MG PO CAPS: 500.0000 mg | ORAL_CAPSULE | Freq: Two times a day (BID) | ORAL | 0 refills | Status: AC

## 2019-07-23 MED ORDER — APIXABAN 5 MG PO TABS: 5.0000 mg | ORAL_TABLET | Freq: Two times a day (BID) | ORAL | 0 refills | Status: DC

## 2019-07-23 MED ORDER — PANTOPRAZOLE SODIUM 40 MG PO TBEC
40.0000 mg | DELAYED_RELEASE_TABLET | Freq: Two times a day (BID) | ORAL | 0 refills | Status: DC
Start: 2019-07-23 — End: 2019-09-02

## 2019-07-23 MED ORDER — CLOPIDOGREL BISULFATE 75 MG PO TABS: 75.0000 mg | ORAL_TABLET | Freq: Every day | ORAL | 1 refills | Status: DC

## 2019-07-23 MED ORDER — ONDANSETRON 4 MG PO TBDP: 4.0000 mg | ORAL_TABLET | Freq: Four times a day (QID) | ORAL | 0 refills | Status: DC | PRN

## 2019-07-23 NOTE — Plan of Care (Signed)
DISCHARGE NOTE HOME Maria Fox to be discharged home per MD order. Discussed prescriptions and follow up appointments with the patient. Prescriptions given to patient; medication list explained in detail. Patient verbalized understanding.  Skin clean, dry and intact without evidence of skin break down, no evidence of skin tears noted. IV catheter discontinued intact. Site without signs and symptoms of complications. Dressing and pressure applied. Pt denies pain at the site currently. No complaints noted.  Patient free of lines, drains, and wounds.   An After Visit Summary (AVS) was printed and given to the patient. Patient escorted via wheelchair, and discharged home via private auto.  Stephan Minister, RN

## 2019-07-23 NOTE — Care Management Important Message (Signed)
Important Message  Patient Details  Name: Maria Fox MRN: [id number] Date of Birth: [birth year]   Medicare Important Message Given:  Yes     Shelda Altes 07/23/2019, 10:10 AM

## 2019-07-23 NOTE — TOC Transition Note (Addendum)
Transition of Care [facility]) - CM/SW Discharge Note   Patient Details  Name: Maria Fox MRN: [id number] Date of Birth: January 14, 1946  [facility]) CM/SW Contact:  Verdell Carmine, RN Phone Number: 07/23/2019, 12:57 PM   Clinical Narrative:    Patient is going home today, declined home health services. Husband to pick up patient this afternoon. No further services identified   Final next level of care: Home/Self Care Barriers to Discharge: No Barriers Identified   Patient Goals and CMS Choice Patient states their goals for this hospitalization and ongoing recovery are:: GO home would like a 4 pronged cane   Choice offered to / list presented to : Patient  Discharge Placement  Home with 4 prong cane.                     Discharge Plan and Services     Post Acute Care Choice: Durable Medical Equipment          DME Arranged: Kasandra Knudsen DME Agency: [employer] Date DME Agency Contacted: 07/22/19 Time DME Agency Contacted: [phone] Representative spoke with at DME Agency: Andree Coss            Social Determinants of Health (SDOH) Interventions     Readmission Risk Interventions No flowsheet data found.

## 2019-07-23 NOTE — Chronic Care Management (AMB) (Signed)
°  Chronic Care Management   Outreach Note  07/23/2019 Name: Maria Fox MRN: [id number] DOB: May 24, 1945  Referred by: [name], MD Reason for referral : Chronic Care Management   A second unsuccessful telephone outreach was attempted today. The patient was referred to pharmacist for assistance with care management and care coordination.  Follow Up Plan:   [place]

## 2019-07-23 NOTE — Discharge Summary (Signed)
Physician Discharge Summary  Maria Fox [id number] DOB: 02-10-1946   PCP: [name], MD  Admit date: 07/06/2019 Discharge date: 07/23/2019 Length of Stay: 16 days   Code Status: Full Code  Admitted From:  Home Discharged to:   [place]: PT  Equipment/Devices:  None Discharge Condition:  Stable  Recommendations for Outpatient Follow-up   1. Follow up with PCP in 1 week for hospital follow up. Needs to discuss better diabetes control 2. Follow up BMP/CBC  3. Follow up urine culture 4. Follow up with Neurology for migraine 5. Follow up with General Surgery for cholecystectomy follow up 6. Follow up with Cardiology in 3 months for a. Flutter 7. Discharged on Eliquis for acute left leg DVT. Treatment for at least 3 months. Aspirin stopped while on eliquis and continued on plavix. Monitor for bleed  Hospital Summary   This is a 74 year old female with hypertension, hyperlipidemia, diabetes, CVA, CAD status post CABG who presented to the hospital with right sided abdominal pain 1 to 2 weeks prior to admission.  She was diagnosed with acute cholecystitis and started on IV antibiotics and subsequently underwent lap chole on 07/07/2019 with Dr. Ninfa Linden.  Postsurgical course was complicated by atrial flutter, chest pain respiratory distress and acute DVT with presumed PE.  She was treated with heparin and subsequently switched to oral Eliquis.  Cardiology was consulted and the patient was started on IV amiodarone which was eventually discontinued due to concern for headache/nausea/migraine from medication.  For her intractable nausea, GI was consulted who recommended treatment for her headache as well as scheduled antiemetics and antacids. Neurology was consulted on 5/9 and was started on treatment for status migrainosus and received migraine cocktail and gabapentin. DHE was held due to history of CVA. Her migraine improved but did not resolve over the coming days with gabapentin  and she was discharged with a course of gabapentin with neurology follow up.   Plan was to be discharged on 5/13 however unfortunately the patient did not have transportation home and her husband who was out of town had the keys to the house. She was discharged on 5/14.  A & P   Principal Problem:   Acute cholecystitis Active Problems:   Hypertension, essential   Hyperlipidemia associated with type 2 diabetes mellitus ([name])   DM (diabetes mellitus), type 2, uncontrolled w/neurologic complication ([name])   Chronic diastolic CHF (congestive heart failure) ([name])   Stage 3 chronic kidney disease due to type 2 diabetes mellitus ([place])   Coronary artery disease of native artery of native heart with stable angina pectoris ([name])   Atrial flutter with rapid ventricular response ([place])   1. Acute cholecystitis status post laparoscopic cholecystectomy on 4/28 with Dr. Ninfa Linden a. CT abdomen on 5/7 with p.o. contrast showing findings concerning for possible early tip appendicitis though this was reviewed by general surgery who thought it was not appendicitis b. GI consulted on 5/8 and recommended management of migraine to help with nausea and added Carafate continue to around-the-clock Zofran  c. Postop nausea resolved  2. Status migrainosus improved today a. Neurology on board, avoiding DHE given CVA history.   b. continue gabapentin 300 mg twice daily with outpatient follow-up with neurology  3. Acute left posterior tibial DVT /presumed but not confirmed PE given acute hypoxic respiratory failure a. New onset A-flutter on 5/1, D-dimer elevated and started on heparin empirically.  Bilateral lower extremity Dopplers obtained showing acute DVT.  Unfortunately patient has a contrast allergy and  CTA was held.  2D echo without RV strain b. Started on Eliquis 5/3 and will require 3 to 6 months anticoagulation for this - aspirin held, continued on plavix  4. Chest pain, atrial flutter rate controlled  and stable a. Cardiology consulted and started on IV amiodarone and continued on beta-blockers.  Recommended continuing this therapy until discharge and outpatient follow-up however, the hospitalist discontinued IV amiodarone given intermittent nausea and vomiting on 5/5 and patient has been tolerating b. Eliquis, plavix and hold aspirin as above c. Follow up with cardiology in 3 months  5. Acute hypoxic respiratory failure, unknown etiology -resolved a. Previous concern for unproven PE without RV strain on echo  b. Currently tolerating room air  6. Diabetes          a. Continue home meds and needs close follow up with PCP  7. Hyperlipidemia on atorvastatin  8. CAD with history of CABG a. Stable  9. HFpEF, not in acute exacerbation  10. AKI on CKD 3 b, improving a. Had CT abdomen without contrast on 5/7 without concerns for hydronephrosis or stones b. UA ordered and showing UTI -will start on oral treatment c. Follow up BMP  11. UTI with history of Klebsiella pneumonia UTI a. Asymptomatic but with AKI b. Klebsiella UTI in 2017 resistant to ampicillin and nitrofurantoin.  Patient has a history of severe allergy to penicillins and adverse effects to sulfa antibiotics prohibiting penicillins, cephalosporins and Bactrim i. notified by pharmacy that the patient has tolerated cephalosporins in the past.  ii. Discharged on Keflex x 3 days c. Follow up urine culture    Consultants  . General surgery . Neurology . GI . cardiology  Procedures  Laparoscopic cholecystectomy on 07/07/2019   Antibiotics   Anti-infectives (From admission, onward)   Start     Dose/Rate Route Frequency Ordered Stop   07/23/19 0000  cephALEXin (KEFLEX) 500 MG capsule     500 mg Oral Every 12 hours 07/23/19 0955 07/25/19 2359   07/22/19 2000  ciprofloxacin (CIPRO) tablet 250 mg  Status:  Discontinued     250 mg Oral 2 times daily 07/22/19 1749 07/22/19 1801   07/22/19 1830  cephALEXin (KEFLEX)  capsule 500 mg  Status:  Discontinued     500 mg Oral Every 12 hours 07/22/19 1802 07/22/19 1803   07/22/19 1830  cephALEXin (KEFLEX) capsule 500 mg     500 mg Oral Every 12 hours 07/22/19 1803 07/25/19 2159   07/08/19 0500  cefTRIAXone (ROCEPHIN) 2 g in sodium chloride 0.9 % 100 mL IVPB  Status:  Discontinued     2 g 200 mL/hr over 30 Minutes Intravenous Every 24 hours 07/07/19 0839 07/07/19 1802   07/07/19 0245  cefTRIAXone (ROCEPHIN) 2 g in sodium chloride 0.9 % 100 mL IVPB     2 g 200 mL/hr over 30 Minutes Intravenous  Once 07/07/19 0231 07/07/19 0451       Subjective  Patient seen and examined at bedside no acute distress and resting comfortably.  No events overnight.  Tolerating diet. In good spirits and anticipating discharge. Headache improving, no phonophobia, some photophobia still present.   Denies any chest pain, shortness of breath, fever, nausea, vomiting, urinary or bowel complaints. Otherwise ROS negative    Objective   Discharge Exam: Vitals:   07/23/19 0636 07/23/19 0738  BP: 108/60 (!) 121/57  Pulse: 64   Resp: 11 17  Temp:  97.9 F (36.6 C)  SpO2:  92%   Vitals:  07/22/19 2334 07/23/19 0437 07/23/19 0636 07/23/19 0738  BP: 114/79 (!) 99/49 108/60 (!) 121/57  Pulse: 65 (!) 55 64   Resp: _0 Temp: 98 F (36.7 C) 97.6 F (36.4 C)  97.9 F (36.6 C)  TempSrc: Oral Oral  Oral  SpO2: 96% 92%  92%  Weight:  66.5 kg    Height:        Physical Exam Vitals and nursing note reviewed.  Constitutional:      Appearance: Normal appearance.  HENT:     Head: Normocephalic and atraumatic.  Eyes:     Conjunctiva/sclera: Conjunctivae normal.  Cardiovascular:     Rate and Rhythm: Normal rate and regular rhythm.  Pulmonary:     Effort: Pulmonary effort is normal.     Breath sounds: Normal breath sounds.  Abdominal:     General: Abdomen is flat.     Palpations: Abdomen is soft.  Musculoskeletal:        General: No swelling or tenderness.  Skin:     Coloration: Skin is not jaundiced or pale.  Neurological:     Mental Status: She is alert. Mental status is at baseline.  Psychiatric:        Mood and Affect: Mood normal.        Behavior: Behavior normal.       The results of significant diagnostics from this hospitalization (including imaging, microbiology, ancillary and laboratory) are listed below for reference.     Microbiology: No results found for this or any previous visit (from the past 240 hour(s)).   Labs: BNP (last 3 results) Recent Labs    07/10/19 1027  BNP 1,660.6*   Basic Metabolic Panel: Recent Labs  Lab 07/18/19 0228 07/18/19 0228 07/19/19 0250 07/20/19 0306 07/21/19 0340 07/22/19 0310 07/23/19 0429  NA 137   < > 138 137 138 139 137  K 5.5*   < > 4.9 4.4 4.3 4.4 4.3  CL 105   < > 105 108 106 108 105  CO2 23   < > 23 22 21* 23 22  GLUCOSE 121*   < > 143* 112* 99 123* 143*  BUN 8   < > _1 CREATININE 1.43*   < > 1.68* 1.42* 1.59* 1.61* 1.41*  CALCIUM 9.1   < > 9.2 8.4* 9.0 9.0 9.1  MG 2.0  --  1.8 2.3 2.1 2.0  --    < > = values in this interval not displayed.   Liver Function Tests: Recent Labs  Lab 07/17/19 0436 07/19/19 0250  AST 21 28  ALT 20 20  ALKPHOS 69 83  BILITOT 0.7 0.7  PROT 5.7* 6.2*  ALBUMIN 2.8* 3.2*   No results for input(s): LIPASE, AMYLASE in the last 168 hours. No results for input(s): AMMONIA in the last 168 hours. CBC: Recent Labs  Lab 07/17/19 0436 07/17/19 0436 07/18/19 0228 07/18/19 0228 07/19/19 0250 07/20/19 0306 07/21/19 0340 07/22/19 0310 07/23/19 0429  WBC 5.4   < > 6.3   < > 6.4 6.4 7.1 7.2 5.9  NEUTROABS 3.0  --  3.5  --  3.6 3.4 4.0  --   --   HGB 12.4   < > 12.9   < > 12.9 12.0 12.6 12.4 12.1  HCT 37.9   < > 39.5   < > 39.6 36.5 39.0 38.0 37.7  MCV 90.5   < > 91.6   < > 90.0 90.6 91.3 91.1  92.0  PLT 266   < > 255   < > 286 254 289 287 264   < > = values in this interval not displayed.   Cardiac Enzymes: No results for  input(s): CKTOTAL, CKMB, CKMBINDEX, TROPONINI in the last 168 hours. BNP: Invalid input(s): POCBNP CBG: Recent Labs  Lab 07/22/19 0619 07/22/19 1154 07/22/19 1653 07/22/19 2149 07/23/19 0629  GLUCAP 130* 223* 122* 138* 133*   D-Dimer No results for input(s): DDIMER in the last 72 hours. Hgb A1c No results for input(s): HGBA1C in the last 72 hours. Lipid Profile No results for input(s): CHOL, HDL, LDLCALC, TRIG, CHOLHDL, LDLDIRECT in the last 72 hours. Thyroid function studies No results for input(s): TSH, T4TOTAL, T3FREE, THYROIDAB in the last 72 hours.  Invalid input(s): FREET3 Anemia work up No results for input(s): VITAMINB12, FOLATE, FERRITIN, TIBC, IRON, RETICCTPCT in the last 72 hours. Urinalysis    Component Value Date/Time   COLORURINE YELLOW 07/22/2019 1200   APPEARANCEUR HAZY (A) 07/22/2019 1200   LABSPEC 1.017 07/22/2019 1200   PHURINE 5.0 07/22/2019 1200   GLUCOSEU NEGATIVE 07/22/2019 1200   HGBUR NEGATIVE 07/22/2019 1200   BILIRUBINUR NEGATIVE 07/22/2019 1200   BILIRUBINUR negative 11/10/2015 1648   KETONESUR 5 (A) 07/22/2019 1200   PROTEINUR NEGATIVE 07/22/2019 1200   UROBILINOGEN 0.2 11/10/2015 1648   UROBILINOGEN 0.2 09/16/2014 0009   NITRITE POSITIVE (A) 07/22/2019 1200   LEUKOCYTESUR SMALL (A) 07/22/2019 1200   Sepsis Labs Invalid input(s): PROCALCITONIN,  WBC,  LACTICIDVEN Microbiology No results found for this or any previous visit (from the past 240 hour(s)).  Discharge Instructions     Discharge Instructions    Diet - low sodium heart healthy   Complete by: As directed    Discharge instructions   Complete by: As directed    You were seen and examined in the hospital for cholecystitis, atrial fibrillation and migraine and cared for by a hospitalist, cardiologist and neurologist.   Upon Discharge:  - Stop taking Aspirin - Continue taking Plavix - Start taking Eliquis 5 mg twice daily. You must monitor your stool for blood or  black/tarry consistency as this can be a GI bleed and you should notify your doctor immediately. Be careful getting up and walking and be mindful not to trip/fall as this medication increases your chance of bleeding - Increase protonix to 40 mg twice daily - Follow up with cardiology (appointment scheduled) - Follow up with neurology - follow up with general surgery - take Keflex twice daily for the next two days for a urinary tract infection - Make an appointment with your primary care physician within 7 days Get lab work prior to your follow up appointment with your PCP Bring all home medications to your appointment to review Request that your primary physician go over all hospital tests and procedures/radiological results at the follow up.   Please get all hospital records sent to your physician by signing a hospital release before you go home.   Read the complete instructions along with all the possible side effects for all the medicines you take and that have been prescribed to you. Take any new medicines after you have completely understood and accept all the possible adverse reactions/side effects.   If you have any questions about your discharge medications or the care you received while you were in the hospital, you can call the unit and asked to speak with the hospitalist on call. Once you are discharged, your primary care physician will  handle any further medical issues. Please note that NO REFILLS for any discharge medications will be authorized, as it is imperative that you return to your primary care physician (or establish a relationship with a primary care physician if you do not have one) for your aftercare needs so that they can reassess your need for medications and monitor your lab values.   Do not drive, operate heavy machinery, perform activities at heights, swimming or participation in water activities or provide baby sitting services if your were admitted for loss of  consciousness/seizures or if you are on sedating medications including, but not limited to benzodiazepines, sleep medications, narcotic pain medications, etc., until you have been cleared to do so by a medical doctor.   Do not take more than prescribed medications.   Wear a seat belt while driving.  If you have smoked or chewed Tobacco in the last 2 years please stop smoking; also stop any regular Alcohol and/or any Recreational drug use including marijuana.  If you experience worsening of your admission symptoms or develop shortness of breath, chest pain, suicidal or homicidal thoughts or experience a life threatening emergency, you must seek medical attention immediately by calling 911 or calling your PCP immediately.   Increase activity slowly   Complete by: As directed    Increase activity slowly   Complete by: As directed      Allergies as of 07/23/2019      Reactions   Bee Venom    Throat swelling   Mushroom Extract Complex Anaphylaxis   Penicillins Anaphylaxis, Hives, Swelling   Has patient had a PCN reaction causing immediate rash, facial/tongue/throat swelling, SOB or lightheadedness with hypotension: Yes Has patient had a PCN reaction causing severe rash involving mucus membranes or skin necrosis: Yes Has patient had a PCN reaction that required hospitalization Yes Has patient had a PCN reaction occurring within the last 10 years: No If all of the above answers are "NO", then may proceed with Cephalosporin use.   Codeine Nausea And Vomiting   Sulfa Antibiotics Nausea And Vomiting   Iohexol    Erythromycin Base Rash      Medication List    STOP taking these medications   aspirin EC 81 MG tablet     TAKE these medications   acetaminophen 500 MG tablet Commonly known as: TYLENOL Take 500 mg by mouth as needed.   albuterol 108 (90 Base) MCG/ACT inhaler Commonly known as: ProAir HFA Inhale 2 puffs into the lungs every 6 (six) hours as needed for wheezing or  shortness of breath.   apixaban 5 MG Tabs tablet Commonly known as: ELIQUIS Take 1 tablet (5 mg total) by mouth 2 (two) times daily.   atorvastatin 40 MG tablet Commonly known as: LIPITOR TAKE 1 TABLET BY MOUTH EVERYDAY AT BEDTIME What changed: See the new instructions.   cephALEXin 500 MG capsule Commonly known as: KEFLEX Take 1 capsule (500 mg total) by mouth every 12 (twelve) hours for 2 days.   clopidogrel 75 MG tablet Commonly known as: PLAVIX Take 1 tablet (75 mg total) by mouth daily.   docusate sodium 100 MG capsule Commonly known as: COLACE Take 1 capsule (100 mg total) by mouth daily. What changed:   when to take this  reasons to take this   furosemide 20 MG tablet Commonly known as: LASIX TAKE 1 TABLET BY MOUTH EVERY DAY   gabapentin 300 MG capsule Commonly known as: NEURONTIN Take 1 capsule (300 mg total) by mouth 2 (  two) times daily. What changed:   medication strength  how much to take  when to take this   glipiZIDE 5 MG tablet Commonly known as: GLUCOTROL TAKE 1 TABLET (5 MG TOTAL) BY MOUTH 2 (TWO) TIMES DAILY BEFORE A MEAL. What changed: See the new instructions.   HYDROcodone-acetaminophen 5-325 MG tablet Commonly known as: NORCO/VICODIN Take 1 tablet by mouth every 6 (six) hours as needed (breakthrough pain).   isosorbide mononitrate 30 MG 24 hr tablet Commonly known as: IMDUR Take 0.5 tablets (15 mg total) by mouth daily.   latanoprost 0.005 % ophthalmic solution Commonly known as: XALATAN Place 1 drop into both eyes at bedtime.   metFORMIN 500 MG tablet Commonly known as: GLUCOPHAGE Take 1 tablet (500 mg total) by mouth 3 (three) times daily before meals.   metoCLOPramide 10 MG tablet Commonly known as: REGLAN Take 1 tablet (10 mg total) by mouth every 6 (six) hours as needed for nausea or vomiting.   metoprolol tartrate 25 MG tablet Commonly known as: LOPRESSOR TAKE 1/2 TABLET BY MOUTH DAILY What changed:   how much to  take  how to take this  when to take this  additional instructions   NON FORMULARY Antifungal cream from [place] as needed   ondansetron 4 MG disintegrating tablet Commonly known as: ZOFRAN-ODT Take 1 tablet (4 mg total) by mouth every 6 (six) hours as needed for nausea or vomiting.   ondansetron 4 MG tablet Commonly known as: ZOFRAN Take 1 tablet (4 mg total) by mouth every 6 (six) hours.   ONE TOUCH ULTRA 2 w/Device Kit Use as instructed to check blood sugar daily.   OneTouch Delica Lancets 76B Misc Use as directed to check blood sugar daily   OneTouch Ultra test strip Generic drug: glucose blood USE AS INSTRUCTED TO CHECK BLOOD SUGAR DAILY What changed: See the new instructions.   pantoprazole 40 MG tablet Commonly known as: Protonix Take 1 tablet (40 mg total) by mouth 2 (two) times daily. What changed:   medication strength  how much to take  when to take this   traZODone 50 MG tablet Commonly known as: DESYREL Take 1.5 tablets (75 mg total) by mouth at bedtime as needed for sleep.   vitamin B-12 1000 MCG tablet Commonly known as: CYANOCOBALAMIN Take 1,000 mcg by mouth daily.            Durable Medical Equipment  (From admission, onward)         Start     Ordered   07/22/19 1008  For home use only DME Other see comment  Once    Comments: Four prong cane  Question:  Length of Need  Answer:  Lifetime   07/22/19 1008         Follow-up Information    [facility]. Go on 07/27/2019.   Specialty: General Surgery Why: 05/18 at 10:15 am. Please call to confirm you appointment time. Please arrive 30 minutes prior to your appointment for paperwork. Please bring a copy of your photo ID and insurance card.  Contact information: [place] [address] [phone]        [facility] Follow up.   Specialty: Cardiology Why: [facility] - see appointments below as listed for  Dr. Caryl Comes in July and Dr. Radford Pax in August. Please call the office if you have any questions. Contact information: [address] [place]  Allergies  Allergen Reactions  . Bee Venom     Throat swelling  . Mushroom Extract Complex Anaphylaxis  . Penicillins Anaphylaxis, Hives and Swelling    Has patient had a PCN reaction causing immediate rash, facial/tongue/throat swelling, SOB or lightheadedness with hypotension: Yes Has patient had a PCN reaction causing severe rash involving mucus membranes or skin necrosis: Yes Has patient had a PCN reaction that required hospitalization Yes Has patient had a PCN reaction occurring within the last 10 years: No If all of the above answers are "NO", then may proceed with Cephalosporin use.   . Codeine Nausea And Vomiting  . Sulfa Antibiotics Nausea And Vomiting  . Iohexol   . Erythromycin Base Rash    Dispo: The patient is from: Home              Anticipated d/c is to: Home              Anticipated d/c date is: today              Patient currently is medically stable to d/c.        Time coordinating discharge: Over 30 minutes   SIGNED:   Harold Hedge, D.O. [facility] Pager: [phone]  07/23/2019, 10:17 AM

## 2019-07-24 LAB — URINE CULTURE: Culture: 10000 — AB

## 2019-07-26 ENCOUNTER — Telehealth: Payer: Self-pay

## 2019-07-26 ENCOUNTER — Other Ambulatory Visit: Payer: Self-pay

## 2019-07-26 MED ORDER — METFORMIN HCL 500 MG PO TABS: 500.0000 mg | ORAL_TABLET | Freq: Three times a day (TID) | ORAL | 1 refills | Status: DC

## 2019-07-26 NOTE — Telephone Encounter (Signed)
1st attempt- Left HIPAA complaint message on voicemail to return my call- need to complete TCM and schedule hospital follow up visit.

## 2019-07-26 NOTE — Telephone Encounter (Signed)
Patient called back Stated she missed a call and wanted to know if it was [place].  Can you call patient back?  Thanks!

## 2019-07-26 NOTE — Telephone Encounter (Signed)
Transition Care Management Follow-up Telephone Call  Date of discharge and from where: [facility]  How have you been since you were released from the hospital? Patient states that she is doing much better. No complaints at this time.  Any questions or concerns? No   Items Reviewed:  Did the pt receive and understand the discharge instructions provided? Yes   Medications obtained and verified? Yes   Any new allergies since your discharge? No   Dietary orders reviewed? Yes  Do you have support at home? Yes   Functional Questionnaire: (I = Independent and D = Dependent) ADLs: I  Bathing/Dressing- I  Meal Prep- I  Eating- I  Maintaining continence- I  Transferring/Ambulation- I  Managing Meds- I  Follow up appointments reviewed:   PCP Hospital f/u appt confirmed? No  Patient did not want to schedule an appointment right now. Patient will call back at a later date to schedule.   [facility] f/u appt confirmed? N/A   Are transportation arrangements needed? No   If their condition worsens, is the pt aware to call PCP or go to the Emergency Dept.? Yes  Was the patient provided with contact information for the PCP's office or ED? Yes  Was to pt encouraged to call back with questions or concerns? Yes

## 2019-07-26 NOTE — Telephone Encounter (Signed)
Patient contacted the office asking to speak with Maria Fox in regards to her prescriptions. She states she needs to speak with her ASAP. I asked if there was anything I could help her with, but she stated she only wanted to speak with Maria Fox.  Thanks!

## 2019-07-26 NOTE — Telephone Encounter (Signed)
Per Sharyn Lull, pharmacist, pt needs refill for metformin sent to [facility].  E-scribed refill.  Notified Michelle.

## 2019-07-27 NOTE — Telephone Encounter (Signed)
Spoke with patient 07/26/19, completed hospital discharge medication reconcilliation and coordinated delivery of medications from [facility].   Thanks!  [name], PharmD Clinical Pharmacist [facility] at [facility] [phone]

## 2019-07-28 NOTE — Telephone Encounter (Signed)
Please call to schedule hospital f/u visit -needs f/u within 2 wks (ideally 1) of hospitalization given complicated course.

## 2019-07-29 NOTE — Telephone Encounter (Signed)
Spoke with pt scheduling OV on 08/03/19 at 12:00.

## 2019-08-03 ENCOUNTER — Other Ambulatory Visit: Payer: Self-pay

## 2019-08-03 ENCOUNTER — Ambulatory Visit (INDEPENDENT_AMBULATORY_CARE_PROVIDER_SITE_OTHER): Payer: Medicare HMO | Admitting: Family Medicine

## 2019-08-03 ENCOUNTER — Encounter: Payer: Self-pay | Admitting: Family Medicine

## 2019-08-03 VITALS — BP 144/84 | HR 96 | Temp 97.6°F | Ht 62.0 in | Wt 146.5 lb

## 2019-08-03 DIAGNOSIS — R519 Headache, unspecified: Secondary | ICD-10-CM

## 2019-08-03 DIAGNOSIS — E1122 Type 2 diabetes mellitus with diabetic chronic kidney disease: Secondary | ICD-10-CM | POA: Diagnosis not present

## 2019-08-03 DIAGNOSIS — E113413 Type 2 diabetes mellitus with severe nonproliferative diabetic retinopathy with macular edema, bilateral: Secondary | ICD-10-CM

## 2019-08-03 DIAGNOSIS — I5032 Chronic diastolic (congestive) heart failure: Secondary | ICD-10-CM

## 2019-08-03 DIAGNOSIS — E1165 Type 2 diabetes mellitus with hyperglycemia: Secondary | ICD-10-CM

## 2019-08-03 DIAGNOSIS — IMO0002 Reserved for concepts with insufficient information to code with codable children: Secondary | ICD-10-CM

## 2019-08-03 DIAGNOSIS — E1149 Type 2 diabetes mellitus with other diabetic neurological complication: Secondary | ICD-10-CM

## 2019-08-03 DIAGNOSIS — I82442 Acute embolism and thrombosis of left tibial vein: Secondary | ICD-10-CM

## 2019-08-03 DIAGNOSIS — I4892 Unspecified atrial flutter: Secondary | ICD-10-CM | POA: Diagnosis not present

## 2019-08-03 DIAGNOSIS — Z8673 Personal history of transient ischemic attack (TIA), and cerebral infarction without residual deficits: Secondary | ICD-10-CM | POA: Diagnosis not present

## 2019-08-03 DIAGNOSIS — N183 Chronic kidney disease, stage 3 unspecified: Secondary | ICD-10-CM

## 2019-08-03 DIAGNOSIS — K810 Acute cholecystitis: Secondary | ICD-10-CM | POA: Diagnosis not present

## 2019-08-03 LAB — RENAL FUNCTION PANEL
Albumin: 4.2 g/dL (ref 3.5–5.2)
BUN: 16 mg/dL (ref 6–23)
CO2: 26 mEq/L (ref 19–32)
Calcium: 9.5 mg/dL (ref 8.4–10.5)
Chloride: 104 mEq/L (ref 96–112)
Creatinine, Ser: 1.25 mg/dL — ABNORMAL HIGH (ref 0.40–1.20)
GFR: 41.63 mL/min — ABNORMAL LOW (ref >60.00)
Glucose, Bld: 323 mg/dL — ABNORMAL HIGH (ref 70–99)
Phosphorus: 3.2 mg/dL (ref 2.3–4.6)
Potassium: 4.5 mEq/L (ref 3.5–5.1)
Sodium: 136 mEq/L (ref 135–145)

## 2019-08-03 LAB — CBC WITH DIFFERENTIAL/PLATELET
Basophils Absolute: 0.1 10*3/uL (ref 0.0–0.1)
Basophils Relative: 1.7 % (ref 0.0–3.0)
Eosinophils Absolute: 0.4 10*3/uL (ref 0.0–0.7)
Eosinophils Relative: 6.6 % — ABNORMAL HIGH (ref 0.0–5.0)
HCT: 40.2 % (ref 36.0–46.0)
Hemoglobin: 13.5 g/dL (ref 12.0–15.0)
Lymphocytes Relative: 29.3 % (ref 12.0–46.0)
Lymphs Abs: 1.9 10*3/uL (ref 0.7–4.0)
MCHC: 33.7 g/dL (ref 30.0–36.0)
MCV: 90.4 fl (ref 78.0–100.0)
Monocytes Absolute: 0.5 10*3/uL (ref 0.1–1.0)
Monocytes Relative: 7.8 % (ref 3.0–12.0)
Neutro Abs: 3.6 10*3/uL (ref 1.4–7.7)
Neutrophils Relative %: 54.6 % (ref 43.0–77.0)
Platelets: 228 10*3/uL (ref 150.0–400.0)
RBC: 4.45 Mil/uL (ref 3.87–5.11)
RDW: 14.3 % (ref 11.5–15.5)
WBC: 6.6 10*3/uL (ref 4.0–10.5)

## 2019-08-03 LAB — HEMOGLOBIN A1C: Hgb A1c MFr Bld: 10.8 % — ABNORMAL HIGH (ref 4.6–6.5)

## 2019-08-03 LAB — SEDIMENTATION RATE: Sed Rate: 14 mm/hr (ref 0–30)

## 2019-08-03 MED ORDER — ONETOUCH ULTRA 2 W/DEVICE KIT: PACK | 0 refills | Status: DC

## 2019-08-03 MED ORDER — TRAZODONE HCL 50 MG PO TABS: 75.0000 mg | ORAL_TABLET | Freq: Every evening | ORAL | 1 refills | Status: DC | PRN

## 2019-08-03 NOTE — Progress Notes (Addendum)
This visit was conducted in person.  BP (!) 144/84 (BP Location: Right Arm, Patient Position: Sitting, Cuff Size: Normal)   Pulse 96   Temp 97.6 F (36.4 C) (Temporal)   Ht '5\' 2"'  (1.575 m)   Wt 146 lb 8 oz (66.5 kg)   SpO2 97%   BMI 26.80 kg/m    CC: hosp f/u visit  Subjective:    Patient ID: Maria Fox, female    DOB: [birth year], 74 y.o.   MRN: [id number]  HPI: Maria Fox is a 74 y.o. female presenting on 08/03/2019 for Hospitalization Follow-up   Recent hospitalization for R sided abd pain x 2 wks - records reviewed. Found to have acute cholecystitis, treated with IV abx then lap chole. Postop course complicated by postop atrial flutter and chest pain as well as respiratory distress with acute DVT and presumed PE. Treated with heparin IV transitioned to eliquis. Cardiology saw patient, started IV amiodarone - but stopped as it may have caused headache, nausea, migraine. GI consulted for intractable nausea. Neurology consulted for intractable migraine (treated with migraine cocktail and gabapentin). Continues gabapentin 343m bid. Ongoing headache and R temple pain into neck.   Has already seen gen surgery Dr BNinfa Lindenin follow up - good report.  Notes ongoing gassiness/burping Uncontrolled diabetes - no low sugars noted recently.  Does not have neurology appt yet scheduled  H/o migraines as a child and young adult, but not recently.    Admit date: 07/06/2019 Discharge date: 07/23/2019 TCM hosp f/u phone call performed 07/26/2019.   Admitted From:  Home Discharged to:   [facility]  Equipment/Devices:  None Discharge Condition:  Stable  Outpatient f/u: 1. Follow up with PCP in 1 week for hospital follow up. Needs to discuss better diabetes control 2. Follow up BMP/CBC  3. Follow up urine culture 4. Follow up with Neurology for migraine 5. Follow up with General Surgery for cholecystectomy follow up 6. Follow up with Cardiology in 3 months for a. Flutter 7.  Discharged on Eliquis for acute left leg DVT. Treatment for at least 3 months. Aspirin stopped while on eliquis and continued on plavix. Monitor for bleed  D/C diagnoses: Principal Problem:   Acute cholecystitis Active Problems:   Hypertension, essential   Hyperlipidemia associated with type 2 diabetes mellitus ([name])   DM (diabetes mellitus), type 2, uncontrolled w/neurologic complication ([name])   Chronic diastolic CHF (congestive heart failure) ([name])   Stage 3 chronic kidney disease due to type 2 diabetes mellitus ([place]   Coronary artery disease of native artery of native heart with stable angina pectoris ([name])   Atrial flutter with rapid ventricular response ([place]     Relevant past medical, surgical, family and social history reviewed and updated as indicated. Interim medical history since our last visit reviewed. Allergies and medications reviewed and updated. Outpatient Medications Prior to Visit  Medication Sig Dispense Refill  . acetaminophen (TYLENOL) 500 MG tablet Take 500 mg by mouth as needed.    .Marland Kitchenalbuterol (PROAIR HFA) 108 (90 Base) MCG/ACT inhaler Inhale 2 puffs into the lungs every 6 (six) hours as needed for wheezing or shortness of breath. 1 Inhaler 1  . apixaban (ELIQUIS) 5 MG TABS tablet Take 1 tablet (5 mg total) by mouth 2 (two) times daily. 184 tablet 0  . atorvastatin (LIPITOR) 40 MG tablet TAKE 1 TABLET BY MOUTH EVERYDAY AT BEDTIME (Patient taking differently: Take 40 mg by mouth daily. ) 90 tablet 3  .  clopidogrel (PLAVIX) 75 MG tablet Take 1 tablet (75 mg total) by mouth daily. 90 tablet 1  . Cyanocobalamin (VITAMIN B12) 500 MCG TABS Take 2 tablets by mouth daily.    Marland Kitchen docusate sodium (COLACE) 100 MG capsule Take 1 capsule (100 mg total) by mouth daily. (Patient taking differently: Take 100 mg by mouth daily as needed for mild constipation. ) 30 capsule 6  . furosemide (LASIX) 20 MG tablet TAKE 1 TABLET BY MOUTH EVERY DAY (Patient taking differently: Take 20 mg by  mouth daily. ) 90 tablet 3  . gabapentin (NEURONTIN) 300 MG capsule Take 1 capsule (300 mg total) by mouth 2 (two) times daily. 60 capsule 1  . glipiZIDE (GLUCOTROL) 5 MG tablet TAKE 1 TABLET (5 MG TOTAL) BY MOUTH 2 (TWO) TIMES DAILY BEFORE A MEAL. 180 tablet 1  . HYDROcodone-acetaminophen (NORCO/VICODIN) 5-325 MG tablet Take 1 tablet by mouth every 6 (six) hours as needed (breakthrough pain). 15 tablet 0  . isosorbide mononitrate (IMDUR) 30 MG 24 hr tablet Take 0.5 tablets (15 mg total) by mouth daily. 90 tablet 3  . latanoprost (XALATAN) 0.005 % ophthalmic solution Place 1 drop into both eyes at bedtime.     . metFORMIN (GLUCOPHAGE) 500 MG tablet Take 1 tablet (500 mg total) by mouth 3 (three) times daily before meals. 270 tablet 1  . metoprolol tartrate (LOPRESSOR) 25 MG tablet TAKE 1/2 TABLET BY MOUTH DAILY (Patient taking differently: Take 12.5 mg by mouth daily. ) 45 tablet 3  . NON FORMULARY Antifungal cream from [place] as needed    . ondansetron (ZOFRAN) 4 MG tablet Take 1 tablet (4 mg total) by mouth every 6 (six) hours. 12 tablet 0  . ondansetron (ZOFRAN-ODT) 4 MG disintegrating tablet Take 4 mg by mouth every 6 (six) hours as needed for nausea or vomiting.    Glory Rosebush Delica Lancets 12I MISC Use as directed to check blood sugar daily 100 each 0  . ONETOUCH ULTRA test strip USE AS INSTRUCTED TO CHECK BLOOD SUGAR DAILY (Patient taking differently: 1 each by Other route See admin instructions. Use as instructed to check blood sugar daily ) 100 strip 3  . pantoprazole (PROTONIX) 40 MG tablet Take 1 tablet (40 mg total) by mouth 2 (two) times daily. 60 tablet 0  . Blood Glucose Monitoring Suppl (ONE TOUCH ULTRA 2) w/Device KIT Use as instructed to check blood sugar daily. 1 kit 0  . traZODone (DESYREL) 50 MG tablet Take 1.5 tablets (75 mg total) by mouth at bedtime as needed for sleep. 135 tablet 1  . metoCLOPramide (REGLAN) 10 MG tablet Take 1 tablet (10 mg total) by mouth every  6 (six) hours as needed for nausea or vomiting. 120 tablet 3  . ondansetron (ZOFRAN-ODT) 4 MG disintegrating tablet Take 1 tablet (4 mg total) by mouth every 6 (six) hours as needed for nausea or vomiting. 20 tablet 0  . vitamin B-12 (CYANOCOBALAMIN) 1000 MCG tablet Take 1,000 mcg by mouth daily.     No facility-administered medications prior to visit.     Per HPI unless specifically indicated in ROS section below Review of Systems Objective:  BP (!) 144/84 (BP Location: Right Arm, Patient Position: Sitting, Cuff Size: Normal)   Pulse 96   Temp 97.6 F (36.4 C) (Temporal)   Ht '5\' 2"'  (1.575 m)   Wt 146 lb 8 oz (66.5 kg)   SpO2 97%   BMI 26.80 kg/m   Wt Readings from Last 3 Encounters:  08/03/19 146 lb 8 oz (66.5 kg)  07/23/19 146 lb 8 oz (66.5 kg)  04/08/19 154 lb (69.9 kg)      Physical Exam Vitals and nursing note reviewed.  Constitutional:      General: She is not in acute distress.    Appearance: Normal appearance. She is not ill-appearing.  HENT:     Head:     Comments: More discomfort to palpation at R neck than at R temporal artery Eyes:     General:        Right eye: No discharge.        Left eye: No discharge.     Extraocular Movements: Extraocular movements intact.     Pupils: Pupils are equal, round, and reactive to light.  Neck:     Vascular: No carotid bruit.  Cardiovascular:     Rate and Rhythm: Normal rate and regular rhythm.     Pulses: Normal pulses.     Heart sounds: Normal heart sounds. No murmur.  Pulmonary:     Effort: Pulmonary effort is normal. No respiratory distress.     Breath sounds: Normal breath sounds. No wheezing, rhonchi or rales.  Abdominal:     General: Abdomen is flat. Bowel sounds are normal. There is no distension.     Palpations: Abdomen is soft. There is no mass.     Tenderness: There is no abdominal tenderness. There is no guarding or rebound.     Hernia: No hernia is present.  Musculoskeletal:     Cervical back: Normal  range of motion and neck supple. Tenderness (R lateral neck below occiput) present. No rigidity.     Right lower leg: Edema (tr) present.     Left lower leg: Edema (tr) present.  Lymphadenopathy:     Cervical: No cervical adenopathy.  Skin:    General: Skin is warm and dry.     Findings: No rash.     Comments: Vitiligo present  Neurological:     Mental Status: She is alert.  Psychiatric:        Mood and Affect: Mood normal.        Behavior: Behavior normal.       Lab Results  Component Value Date   VITAMINB12 927 (H) 12/04/2018   Results for orders placed or performed in visit on 08/03/19  Sedimentation rate  Result Value Ref Range   Sed Rate 14 0 - 30 mm/hr  CBC with Differential/Platelet  Result Value Ref Range   WBC 6.6 4.0 - 10.5 K/uL   RBC 4.45 3.87 - 5.11 Mil/uL   Hemoglobin 13.5 12.0 - 15.0 g/dL   HCT 40.2 36.0 - 46.0 %   MCV 90.4 78.0 - 100.0 fl   MCHC 33.7 30.0 - 36.0 g/dL   RDW 14.3 11.5 - 15.5 %   Platelets 228.0 150.0 - 400.0 K/uL   Neutrophils Relative % 54.6 43.0 - 77.0 %   Lymphocytes Relative 29.3 12.0 - 46.0 %   Monocytes Relative 7.8 3.0 - 12.0 %   Eosinophils Relative 6.6 (H) 0.0 - 5.0 %   Basophils Relative 1.7 0.0 - 3.0 %   Neutro Abs 3.6 1.4 - 7.7 K/uL   Lymphs Abs 1.9 0.7 - 4.0 K/uL   Monocytes Absolute 0.5 0.1 - 1.0 K/uL   Eosinophils Absolute 0.4 0.0 - 0.7 K/uL   Basophils Absolute 0.1 0.0 - 0.1 K/uL  Hemoglobin A1c  Result Value Ref Range   Hgb A1c MFr Bld 10.8 (H) 4.6 -  6.5 %  Renal function panel  Result Value Ref Range   Sodium 136 135 - 145 mEq/L   Potassium 4.5 3.5 - 5.1 mEq/L   Chloride 104 96 - 112 mEq/L   CO2 26 19 - 32 mEq/L   Albumin 4.2 3.5 - 5.2 g/dL   BUN 16 6 - 23 mg/dL   Creatinine, Ser 1.25 (H) 0.40 - 1.20 mg/dL   Glucose, Bld 323 (H) 70 - 99 mg/dL   Phosphorus 3.2 2.3 - 4.6 mg/dL   GFR 41.63 (L) >60.00 mL/min   Calcium 9.5 8.4 - 10.5 mg/dL    Assessment & Plan:  This visit occurred during the SARS-CoV-2 public  health emergency.  Safety protocols were in place, including screening questions prior to the visit, additional usage of staff PPE, and extensive cleaning of exam room while observing appropriate contact time as indicated for disinfecting solutions.   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 She has h/o migraines as a child but none recently. Given temporal nature of pain will check ESR to further evaluate for temporal arteritis. Notes ongoing R temple headache and R neck pain - ?cervicogenic. Continues gabapentin to treat possible migraine. Consider neurology eval.       Relevant Medications   traZODone (DESYREL) 50 MG tablet   Other Relevant Orders   Sedimentation rate (Completed)   Ambulatory referral to Neurology   History of cerebrovascular accident (CVA) in adulthood    Continue statin and plavix. Aspirin recently changed to eliquis (in setting of recent DVT/PE)      DM (diabetes mellitus), type 2, uncontrolled w/neurologic complication ([place])    Poor control based on latest A1c as well as recent cbg's - with both highs and lows. Never returned for prior f/u as requested. Pt states she's taking both metformin TID and glipizide BID. Update A1c today, consider additional medication to achieve better glycemic control. Consider SGLT2 vs GLP1 RA pending affordability      Relevant Medications   Blood Glucose Monitoring Suppl (ONE TOUCH ULTRA 2) w/Device KIT   Other Relevant Orders   Hemoglobin A1c (Completed)   Chronic diastolic CHF (congestive heart failure) ([name])     Continue lasix daily.       Atrial flutter with rapid ventricular response ([name])    Pos-op A flutter planned cardiology and EP f/u. Amiodarone may have caused HA, nausea.       Relevant Orders   CBC with Differential/Platelet (Completed)   Acute deep vein thrombosis (DVT) of left tibial vein ([name])    Hospitalization complicated by acute L post tib DVT as well as presumed PE given acute chest pain with respiratory failure that developed afterwards (CTA was not performed due to contrast allergy).  Now on eliquis, dyspnea has resolved. Will likely need for at least 6 months.       Acute cholecystitis - Primary    S/p cholecystectomy, recovering well.  Has had gen surg f/u with good report.           Meds ordered this encounter  Medications  .  traZODone (DESYREL) 50 MG tablet    Sig: Take 1.5 tablets (75 mg total) by mouth at bedtime as needed for sleep.    Dispense:  135 tablet    Refill:  1  . Blood Glucose Monitoring Suppl (ONE TOUCH ULTRA 2) w/Device KIT    Sig: Use as instructed to check blood sugar daily.    Dispense:  1 kit    Refill:  0   Orders Placed This Encounter  Procedures  . Sedimentation rate  . CBC with Differential/Platelet  . Hemoglobin A1c  . Renal function panel  . Ambulatory referral to Neurology    Referral Priority:   Routine    Referral Type:   Consultation    Referral Reason:   Specialty Services Required    Requested Specialty:   Neurology    Number of Visits Requested:   1    Patient Instructions  Labs today I'm glad you're feeling better We will evaluate for temporal arteritis as possible cause of headache.  We may refer you back to neurology pending results.  Return in 3 months for follow up visit .   Follow up plan: Return in about 3 months (around 11/03/2019) for follow up visit.  [name], MD

## 2019-08-03 NOTE — Assessment & Plan Note (Signed)
Continue statin and plavix. Aspirin recently changed to eliquis (in setting of recent DVT/PE)

## 2019-08-03 NOTE — Assessment & Plan Note (Signed)
Continue lasix daily.

## 2019-08-03 NOTE — Patient Instructions (Addendum)
Labs today I'm glad you're feeling better We will evaluate for temporal arteritis as possible cause of headache.  We may refer you back to neurology pending results.  Return in 3 months for follow up visit .

## 2019-08-03 NOTE — Assessment & Plan Note (Addendum)
Poor control based on latest A1c as well as recent cbg's - with both highs and lows. Never returned for prior f/u as requested. Pt states she's taking both metformin TID and glipizide BID. Update A1c today, consider additional medication to achieve better glycemic control. Consider SGLT2 vs GLP1 RA pending affordability

## 2019-08-04 DIAGNOSIS — R69 Illness, unspecified: Secondary | ICD-10-CM | POA: Diagnosis not present

## 2019-08-06 ENCOUNTER — Encounter: Payer: Self-pay | Admitting: Family Medicine

## 2019-08-06 ENCOUNTER — Other Ambulatory Visit: Payer: Self-pay | Admitting: Family Medicine

## 2019-08-06 DIAGNOSIS — R519 Headache, unspecified: Secondary | ICD-10-CM | POA: Insufficient documentation

## 2019-08-06 MED ORDER — DAPAGLIFLOZIN PROPANEDIOL 5 MG PO TABS
5.0000 mg | ORAL_TABLET | Freq: Every day | ORAL | 6 refills | Status: DC
Start: 2019-08-06 — End: 2020-01-17

## 2019-08-06 NOTE — Assessment & Plan Note (Addendum)
Possibly caused by amiodarone. She has h/o migraines as a child but none recently. Given temporal nature of pain will check ESR to further evaluate for temporal arteritis. Notes ongoing R temple headache and R neck pain - ?cervicogenic. Continues gabapentin to treat possible migraine. Consider neurology eval.

## 2019-08-06 NOTE — Assessment & Plan Note (Signed)
Acute on chronic kidney disease during latest hospitalization, now kidneys returning to prior baseline of Cr 1.2

## 2019-08-06 NOTE — Assessment & Plan Note (Signed)
S/p cholecystectomy, recovering well.  Has had gen surg f/u with good report.

## 2019-08-06 NOTE — Assessment & Plan Note (Addendum)
Pos-op A flutter planned cardiology and EP f/u. Amiodarone may have caused HA, nausea.

## 2019-08-06 NOTE — Assessment & Plan Note (Signed)
Hospitalization complicated by acute L post tib DVT as well as presumed PE given acute chest pain with respiratory failure that developed afterwards (CTA was not performed due to contrast allergy).  Now on eliquis, dyspnea has resolved. Will likely need for at least 6 months.

## 2019-08-10 ENCOUNTER — Telehealth: Payer: Self-pay | Admitting: Family Medicine

## 2019-08-10 DIAGNOSIS — R69 Illness, unspecified: Secondary | ICD-10-CM | POA: Diagnosis not present

## 2019-08-10 DIAGNOSIS — IMO0002 Reserved for concepts with insufficient information to code with codable children: Secondary | ICD-10-CM

## 2019-08-10 MED ORDER — ONETOUCH DELICA LANCETS 33G MISC: 3 refills | Status: DC

## 2019-08-10 MED ORDER — ONETOUCH ULTRA VI STRP: ORAL_STRIP | 3 refills | Status: DC

## 2019-08-10 MED ORDER — ONETOUCH ULTRA 2 W/DEVICE KIT: PACK | 0 refills | Status: DC

## 2019-08-10 NOTE — Addendum Note (Signed)
Addended by: Brenton Grills on: [id number] PM   Modules accepted: Orders

## 2019-08-10 NOTE — Telephone Encounter (Addendum)
E-scribed rx for OneTouch Ultra 2 meter kit, test strips and OneTouch Delica lancets to [facility].

## 2019-08-10 NOTE — Telephone Encounter (Signed)
Spoke with pt relaying Dr. Synthia Innocent message.  Verbalizes understanding.  States she received meter a few days ago.  However, needs test strips and lancets.  Notified her rxs were sent in for both.  Spoke with Caryl Pina at [facility] her pt does had already received meter kit last week.  Says she will not fill it, just strips and lancets.

## 2019-08-10 NOTE — Telephone Encounter (Signed)
I want her to start farxiga along with her metformin and glipizide. May take farxiga in the morning with breakfast.  We discussed insulin, but don't want to start that yet pending farxiga effect.  Please send one touch ultra 2 meter and strips to [facility] with instructions to check sugars BID PRN (E11.49).

## 2019-08-10 NOTE — Telephone Encounter (Signed)
Patient called needing clarification on her medications.  He stated that She was prescribed a new diabetic medication, did not remember the name. But she wanted to know if she needed to take this by its self or if she needed to take along with the other diabetic medications she is already taking.   Patient stated if she does not answer to please leave detailed message on her voicemail [phone])   She is also needing a script sent to her pharmacy for her lancets and test strips. She stated she is using a One Touch Ultra 2 meter.   [facility] - [address], [place]

## 2019-09-01 ENCOUNTER — Telehealth: Payer: Self-pay

## 2019-09-01 ENCOUNTER — Ambulatory Visit: Payer: Self-pay

## 2019-09-01 DIAGNOSIS — E113413 Type 2 diabetes mellitus with severe nonproliferative diabetic retinopathy with macular edema, bilateral: Secondary | ICD-10-CM

## 2019-09-01 DIAGNOSIS — K219 Gastro-esophageal reflux disease without esophagitis: Secondary | ICD-10-CM

## 2019-09-01 NOTE — Chronic Care Management (AMB) (Signed)
Medication Coordination and Adherence Call:   Reviewed chart for medication changes ahead of medication coordination call.  Office Visits:   08/03/19: PCP visit - hospital follow up, start Farxiga   07/06/19: Hospitalized for cholecystitis, DVT and presumed PE  Medication reconciliation performed after hospitalization in April and medications resynchronized.  New medications at discharge include: Apixaban 5 mg BID, hydrocodone (7 day supply), ondansetron (7 day supply)  Medication changed at discharge include: gabapentin increased from 100 mg BID to 300 mg BID, pantoprazole increased from 20 mg BID to 40 mg BID  New medications per PCP 08/06/19: [name] 5 mg - 1 tablet daily  BP Readings from Last 3 Encounters:  08/03/19 (!) 144/84  07/23/19 118/65  04/08/19 126/75    Lab Results  Component Value Date   HGBA1C 10.8 (H) 08/03/2019    Patient obtains medications through: vials (in process of syncing for adherence packaging)  This delivery to include:  Gabapentin (enough to last until sync date of 10/26/19)  Pantoprazole  (enough to last until sync date of 10/26/19)  Patient needs refills for gabapentin and pantoprazole prior to this delivery. Sent request to PCP. Will schedule delivery as soon as new prescriptions are received. Advised patient that pharmacy will contact them the morning of delivery.  Next delivery will be 3-5 days prior to sync date: 10/26/19  [name], PharmD Clinical Pharmacist [facility] at [facility] [phone]

## 2019-09-01 NOTE — Progress Notes (Signed)
I have collaborated with the care management provider regarding care management and care coordination activities outlined in this encounter and have reviewed this encounter including documentation in the note and care plan. I am certifying that I agree with the content of this note and encounter as supervising physician.  

## 2019-09-01 NOTE — Telephone Encounter (Signed)
Patient requesting refills on gabapentin and pantoprazole.  Both medication doses were increased during hospitalization in April. She is currently taking gabapentin 300 mg BID (increased from 100 mg BID), pantoprazole 40 mg BID (increased from 20 mg BID). She was sent home a 30 DS and no refills at discharge.  Would you like to send in new prescriptions for gabapentin 300 mg BID and pantoprazole 40 mg BID or would you like her to go back to decreased dose from pre-hospital stay? We still have the original prescriptions on file at [facility].  Thank you,  [name], PharmD Clinical Pharmacist [facility] at [facility] [phone]

## 2019-09-02 MED ORDER — GABAPENTIN 300 MG PO CAPS: 300.0000 mg | ORAL_CAPSULE | Freq: Two times a day (BID) | ORAL | 11 refills | Status: DC

## 2019-09-02 MED ORDER — PANTOPRAZOLE SODIUM 40 MG PO TBEC: 40.0000 mg | DELAYED_RELEASE_TABLET | Freq: Two times a day (BID) | ORAL | 6 refills | Status: DC

## 2019-09-02 NOTE — Telephone Encounter (Signed)
Having difficulty sending to [facility] so will send to [facility]. Ok to refill at higher dose of both if both effective.  Will need to monitor kidney function on higher pantoprazole dose.

## 2019-09-06 ENCOUNTER — Ambulatory Visit: Payer: Medicare Other | Admitting: Podiatry

## 2019-09-07 NOTE — Telephone Encounter (Signed)
Per chart review tab pt had appt with DR G on 03/15/19.

## 2019-09-10 ENCOUNTER — Ambulatory Visit: Payer: Medicare HMO | Admitting: Internal Medicine

## 2019-09-10 ENCOUNTER — Ambulatory Visit: Payer: Medicare HMO

## 2019-09-10 DIAGNOSIS — E113413 Type 2 diabetes mellitus with severe nonproliferative diabetic retinopathy with macular edema, bilateral: Secondary | ICD-10-CM

## 2019-09-10 DIAGNOSIS — I10 Essential (primary) hypertension: Secondary | ICD-10-CM

## 2019-09-10 NOTE — Chronic Care Management (AMB) (Signed)
Chronic Care Management Pharmacy  Name: Maria Fox  MRN: [id number] DOB: 1946/02/14  Chief Complaint/ HPI  Maria Fox,  74 y.o., female presents for their Follow-Up CCM visit with the clinical pharmacist via telephone.  PCP : Maria Bush, MD  Their chronic conditions include: hypertension, chronic heart failure, coronary artery disease, fatty liver disease, GERD, hyperlipidemia, diabetes, chronic kidney disease, osteopenia, depressive disorder, insomnia, iron deficiency, B12 deficiency   *Rescheduled last CCM follow up due to patient being in hospital   Patient concerns: feeling better since hospitalization, still poor appetite  Medication reconciliation performed after hospitalization in April 2021:  New medications at discharge include: Apixaban 5 mg BID, hydrocodone (7 day supply), ondansetron (7 day supply)  Medication changed at discharge include: gabapentin increased from 100 mg BID to 300 mg BID, pantoprazole increased from 20 mg BID to 40 mg BID  New medications per PCP 08/06/19: Maria Fox 5 mg - 1 tablet daily  Office Visits:   08/03/19: PCP visit - hospital follow up, start [place]   07/06/19: Hospitalized for cholecystitis, DVT and presumed PE  03/15/19: Maria Fox - possible mastitis, doxycyline course, schedule mammogram, diabetes uncontrolled, review SGLT2 GLP1 at next visit, affordability remains a concern, rtc 2 weeks  12/11/19: Maria Fox - restart Cymbalta 20 mg daily, change glimepiride to glipizide 5 mg BID, check with insurance for preferred glucose meter, rtc 4 months  Consult Visit:  12/24/18: Cardiology - cont current meds  Allergies  Allergen Reactions  . Bee Venom     Throat swelling  . Mushroom Extract Complex Anaphylaxis  . Penicillins Anaphylaxis, Hives and Swelling    Has patient had a PCN reaction causing immediate rash, facial/tongue/throat swelling, SOB or lightheadedness with hypotension: Yes Has patient had a PCN reaction causing  severe rash involving mucus membranes or skin necrosis: Yes Has patient had a PCN reaction that required hospitalization Yes Has patient had a PCN reaction occurring within the last 10 years: No If all of the above answers are "NO", then may proceed with Cephalosporin use.   . Codeine Nausea And Vomiting  . Sulfa Antibiotics Nausea And Vomiting  . Iohexol   . Erythromycin Base Rash   Medications: Outpatient Encounter Medications as of 09/10/2019  Medication Sig  . acetaminophen (TYLENOL) 500 MG tablet Take 500 mg by mouth as needed.  Marland Kitchen albuterol (PROAIR HFA) 108 (90 Base) MCG/ACT inhaler Inhale 2 puffs into the lungs every 6 (six) hours as needed for wheezing or shortness of breath.  Marland Kitchen apixaban (ELIQUIS) 5 MG TABS tablet Take 1 tablet (5 mg total) by mouth 2 (two) times daily.  Marland Kitchen atorvastatin (LIPITOR) 40 MG tablet TAKE 1 TABLET BY MOUTH EVERYDAY AT BEDTIME  . Blood Glucose Monitoring Suppl (ONE TOUCH ULTRA 2) w/Device KIT Use as instructed to check blood sugar 2 times daily as needed.  . clopidogrel (PLAVIX) 75 MG tablet Take 1 tablet (75 mg total) by mouth daily.  . Cyanocobalamin (VITAMIN B12) 500 MCG TABS Take 2 tablets by mouth daily.  . dapagliflozin propanediol (FARXIGA) 5 MG TABS tablet Take 1 tablet (5 mg total) by mouth daily before breakfast.  . docusate sodium (COLACE) 100 MG capsule Take 1 capsule (100 mg total) by mouth daily. (Patient taking differently: Take 100 mg by mouth daily as needed for mild constipation. )  . furosemide (LASIX) 20 MG tablet TAKE 1 TABLET BY MOUTH EVERY DAY  . gabapentin (NEURONTIN) 300 MG capsule Take 1 capsule (300 mg total) by mouth 2 (  two) times daily.  Marland Kitchen glipiZIDE (GLUCOTROL) 5 MG tablet TAKE 1 TABLET (5 MG TOTAL) BY MOUTH 2 (TWO) TIMES DAILY BEFORE A MEAL.  Marland Kitchen glucose blood (ONETOUCH ULTRA) test strip Use as instructed to check blood sugar 2 times daily as needed  . HYDROcodone-acetaminophen (NORCO/VICODIN) 5-325 MG tablet Take 1 tablet by mouth  every 6 (six) hours as needed (breakthrough pain).  . isosorbide mononitrate (IMDUR) 30 MG 24 hr tablet Take 0.5 tablets (15 mg total) by mouth daily.  Marland Kitchen latanoprost (XALATAN) 0.005 % ophthalmic solution Place 1 drop into both eyes at bedtime.   . metFORMIN (GLUCOPHAGE) 500 MG tablet Take 1 tablet (500 mg total) by mouth 3 (three) times daily before meals.  . metoCLOPramide (REGLAN) 10 MG tablet Take 1 tablet (10 mg total) by mouth every 6 (six) hours as needed for nausea or vomiting.  . metoprolol tartrate (LOPRESSOR) 25 MG tablet TAKE 1/2 TABLET BY MOUTH DAILY  . NON FORMULARY Antifungal cream from [place] as needed  . ondansetron (ZOFRAN) 4 MG tablet Take 1 tablet (4 mg total) by mouth every 6 (six) hours.  . ondansetron (ZOFRAN-ODT) 4 MG disintegrating tablet Take 4 mg by mouth every 6 (six) hours as needed for nausea or vomiting.  Maria Fox 00Q MISC Use as directed to check blood sugar 2 times daily as needed  . pantoprazole (PROTONIX) 40 MG tablet Take 1 tablet (40 mg total) by mouth 2 (two) times daily.  . traZODone (DESYREL) 50 MG tablet Take 1.5 tablets (75 mg total) by mouth at bedtime as needed for sleep.   No facility-administered encounter medications on file as of 09/10/2019.   Current Diagnosis/Assessment: Goals    . Increase physical activity     Starting 12/26/2016, I will continue to walk for 30 min daily.     . Patient Stated     12/08/2018, Patient wants to maintain and continue medications as prescribed.     . Pharmacy Care Plan     CARE PLAN ENTRY  Current Barriers:  . Chronic Disease Management support, education, and care coordination needs related to hypertension, diabetes  Pharmacist Clinical Goal(s):  Over the next 3 weeks, patient will work with PharmD and primary care provider to address the following goals: Marland Kitchen Diabetes: Improve A1c within goal of < 7%; Fasting blood glucose goal of 80-130 mg/dL and 1-2 hours after meal goal of less  than 180 mg/dL.  Marland Kitchen Hypertension: Maintain blood pressure within goal of < 140/90 mmHg . Vaccinations: Recommend tetanus vaccine (Tdap) and shingles vaccine, 2-dose series (Shingrix).   Interventions: . Comprehensive medication review performed . Discussed self monitoring blood glucose  . Coordinate pharmacy coordination and delivery services  Patient Self Care Activities:  For the next 3 weeks until follow up:  . Patient will begin monitoring blood glucose daily before breakfast and 2 hours after first bite of meals.  . Incorporate a healthy diet high in vegetables, fruits and whole grains with low-fat dairy products, chicken, fish, legumes, non-tropical vegetable oils and nuts. Limit intake of sweets, sugar-sweetened beverages and red meats.  Updated goals; see past updates      Hypertension   CMP Latest Ref Rng & Units 08/03/2019 07/23/2019 07/22/2019  Glucose 70 - 99 mg/dL 323(H) 143(H) 123(H)  BUN 6 - 23 mg/dL '16 12 11  ' Creatinine 0.40 - 1.20 mg/dL 1.25(H) 1.41(H) 1.61(H)  Sodium 135 - 145 mEq/L 136 137 139  Potassium 3.5 - 5.1 mEq/L 4.5 4.3 4.4  Chloride 96 - 112 mEq/L 104 105 108  CO2 19 - 32 mEq/L '26 22 23  ' Calcium 8.4 - 10.5 mg/dL 9.5 9.1 9.0  Total Protein 6.5 - 8.1 g/dL - - -  Total Bilirubin 0.3 - 1.2 mg/dL - - -  Alkaline Phos 38 - 126 U/L - - -  AST 15 - 41 U/L - - -  ALT 0 - 44 U/L - - -   Office blood pressures are: BP Readings from Last 3 Encounters:  08/03/19 (!) 144/84  07/23/19 118/65  04/08/19 126/75   BP < 140/90 mmHg Patient has failed these meds in the past: none reported Patient checks BP at home: has home monitor but doesn't use it much, checks at [facility] Patient home BP readings are ranging: none reported  Patient is currently controlled on the following medications:   No pharmacotherapy  We discussed: history of orthostatic hypotension; BP usually within goal in clinic  Plan: Continue current medications; Recommend ordering home BP  monitor.   Diabetes   Kidney Function Lab Results  Component Value Date/Time   CREATININE 1.25 (H) 08/03/2019 01:03 PM   CREATININE 1.41 (H) 07/23/2019 04:29 AM   GFR 41.63 (L) 08/03/2019 01:03 PM   GFRNONAA 36 (L) 07/23/2019 04:29 AM   GFRAA 42 (L) 07/23/2019 04:29 AM   K 4.5 08/03/2019 01:03 PM   K 4.3 07/23/2019 04:29 AM   Recent Relevant Labs: Lab Results  Component Value Date/Time   HGBA1C 10.8 (H) 08/03/2019 01:03 PM   HGBA1C 11.0 (A) 03/15/2019 03:39 PM   HGBA1C 9.0 (H) 12/04/2018 08:13 AM   MICROALBUR <0.7 07/16/2018 11:20 AM   MICROALBUR <0.7 11/26/2017 06:05 PM    A1c goal < 7% Checking BG: Rarely - not checking, received glucometer after hospital stay, has not set it up yet  Diet: eats one meal a day   Lunch: mostly fast foods, salad, sandwich from [employer], pasta  Snacks: potato chips  Drinks: coke, green tea with honey --> has switched to water mostly since hospitalization, occasional sweet tea  Denies alcohol or tobacco use    Patient has failed these meds in past: glimepiride  Patient is currently uncontrolled on the following medications:   Metformin 500 mg - 1 tablet TID before meals (max dose 1000 mg/day GFR 30-45)  Glipizide 5 mg - 1 tablet BID  Farxiga 5 mg - 1 tablet daily  Last diabetic eye exam:  Lab Results  Component Value Date/Time   HMDIABEYEEXA Retinopathy (A) 05/10/2018 12:00 AM    Last diabetic foot exam: due for annual foot exam   We discussed: confirmed adherence, prefers to avoid insulin/injections Cost: approved for LIS April 2021  Plan: Continue current medications; Recommend checking blood glucose twice daily for the next 3 weeks, then f/u call to assess.    Vaccines   Reviewed and discussed patient's vaccination history.    Immunization History  Administered Date(s) Administered  . Fluad Quad(high Dose 65+) 12/11/2018  . Influenza Split 12/17/2011  . Influenza Whole 01/22/2013  . Influenza,inj,Quad PF,6+ Mos  11/10/2015, 12/26/2016, 11/26/2017  . Pneumococcal Conjugate-13 12/26/2016  . Pneumococcal-Unspecified 11/30/2010  . Td 08/10/2003   Plan: Recommended patient receive Shingrix and Tdap.   Medication Management  Pharmacy: [facility] adherence packaging   Affordability: no concerns, has LIS  CCM Follow Up:  3 weeks (telephone) for BG log   [name], PharmD Clinical Pharmacist [facility] at [facility] [phone]

## 2019-09-14 NOTE — Progress Notes (Signed)
I have collaborated with the care management provider regarding care management and care coordination activities outlined in this encounter and have reviewed this encounter including documentation in the note and care plan. I am certifying that I agree with the content of this note and encounter as supervising physician.  

## 2019-09-14 NOTE — Patient Instructions (Addendum)
Dear Maria Fox,  Below is a summary of the goals we discussed during our follow up appointment on September 14, 2019. Please contact me anytime with questions or concerns.   Visit Information  Goals Addressed            This Visit's Progress   . Pharmacy Care Plan       CARE PLAN ENTRY  Current Barriers:  . Chronic Disease Management support, education, and care coordination needs related to hypertension, diabetes  Pharmacist Clinical Goal(s):  Over the next 3 weeks, patient will work with PharmD and primary care provider to address the following goals: Marland Kitchen Diabetes: Improve A1c within goal of < 7%; Fasting blood glucose goal of 80-130 mg/dL and 1-2 hours after meal goal of less than 180 mg/dL.  Marland Kitchen Hypertension: Maintain blood pressure within goal of < 140/90 mmHg . Vaccinations: Recommend tetanus vaccine (Tdap) and shingles vaccine, 2-dose series (Shingrix).   Interventions: . Comprehensive medication review performed . Discussed self monitoring blood glucose  . Coordinate pharmacy coordination and delivery services  Patient Self Care Activities:  For the next 3 weeks until follow up:  . Patient will begin monitoring blood glucose daily before breakfast and 2 hours after first bite of meals.  . Incorporate a healthy diet high in vegetables, fruits and whole grains with low-fat dairy products, chicken, fish, legumes, non-tropical vegetable oils and nuts. Limit intake of sweets, sugar-sweetened beverages and red meats.  Updated goals; see past updates      The patient verbalized understanding of instructions provided today and agreed to receive a mailed copy of patient instruction and/or educational materials.  Telephone follow up appointment with pharmacy team member scheduled for: October 07, 2019 at 1:00 PM (telephone)   [name], PharmD Clinical Pharmacist [facility] at [facility] [phone]

## 2019-09-29 ENCOUNTER — Other Ambulatory Visit: Payer: Self-pay

## 2019-09-29 MED ORDER — ONDANSETRON HCL 4 MG PO TABS: 4.0000 mg | ORAL_TABLET | Freq: Three times a day (TID) | ORAL | 0 refills | Status: DC | PRN

## 2019-09-29 NOTE — Addendum Note (Signed)
Addended by: [name] on: 09/29/2019 02:45 PM   Modules accepted: Orders

## 2019-09-29 NOTE — Telephone Encounter (Signed)
Sent in

## 2019-09-29 NOTE — Telephone Encounter (Signed)
Patient called requesting refill on Zofran 4 mg. She has been taking it PRN once daily for nausea since gallbladder removal in April. Prescription was written at discharge. She is wondering if PCP would send refills to [facility].  [name], PharmD Clinical Pharmacist [place] [facility] at [facility] [phone]

## 2019-09-29 NOTE — Addendum Note (Signed)
Addended by: Brenton Grills on: 9/61/1643 53:91 PM   Modules accepted: Orders

## 2019-10-02 DIAGNOSIS — I110 Hypertensive heart disease with heart failure: Secondary | ICD-10-CM | POA: Diagnosis not present

## 2019-10-02 DIAGNOSIS — G4700 Insomnia, unspecified: Secondary | ICD-10-CM | POA: Diagnosis not present

## 2019-10-02 DIAGNOSIS — I252 Old myocardial infarction: Secondary | ICD-10-CM | POA: Diagnosis not present

## 2019-10-02 DIAGNOSIS — I509 Heart failure, unspecified: Secondary | ICD-10-CM | POA: Diagnosis not present

## 2019-10-02 DIAGNOSIS — I25119 Atherosclerotic heart disease of native coronary artery with unspecified angina pectoris: Secondary | ICD-10-CM | POA: Diagnosis not present

## 2019-10-02 DIAGNOSIS — H409 Unspecified glaucoma: Secondary | ICD-10-CM | POA: Diagnosis not present

## 2019-10-02 DIAGNOSIS — I951 Orthostatic hypotension: Secondary | ICD-10-CM | POA: Diagnosis not present

## 2019-10-02 DIAGNOSIS — E1142 Type 2 diabetes mellitus with diabetic polyneuropathy: Secondary | ICD-10-CM | POA: Diagnosis not present

## 2019-10-02 DIAGNOSIS — E785 Hyperlipidemia, unspecified: Secondary | ICD-10-CM | POA: Diagnosis not present

## 2019-10-02 DIAGNOSIS — G8929 Other chronic pain: Secondary | ICD-10-CM | POA: Diagnosis not present

## 2019-10-07 ENCOUNTER — Telehealth: Payer: Medicare HMO

## 2019-10-14 ENCOUNTER — Telehealth: Payer: Self-pay

## 2019-10-14 NOTE — Progress Notes (Signed)
Unsuccessful outreach. Called patient for medication coordination. Left voicemail for her to return call. 10/14/19 @1109am .   Martinique Uselman, [place] Pharmacist Assistant  [phone]

## 2019-10-18 ENCOUNTER — Telehealth: Payer: Self-pay

## 2019-10-18 ENCOUNTER — Ambulatory Visit: Payer: Medicare HMO | Admitting: Cardiology

## 2019-10-18 NOTE — Progress Notes (Signed)
Unsuccessful outreach to patient for medication adherence call. Left message for return call at 1242PM 10/18/19.  Martinique Uselman, [place] Pharmacist Assistant  [phone]

## 2019-10-19 ENCOUNTER — Telehealth: Payer: Self-pay

## 2019-10-19 NOTE — Progress Notes (Signed)
Unsuccessful outreach to patient for medication adherence call. Left voicemail to please return call as soon as possible. 10/19/19 at 1047AM.  Martinique Uselman, [place] Pharmacist Assistant  [phone]
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
## 2019-10-20 ENCOUNTER — Telehealth: Payer: Self-pay

## 2019-10-20 ENCOUNTER — Ambulatory Visit: Payer: Medicare HMO | Admitting: Neurology

## 2019-10-20 NOTE — Progress Notes (Signed)
Chronic Care Management Pharmacy Assistant   Name: Maria Fox  MRN: [id number] DOB: December 04, 1945  Reason for Encounter: Medication Review  Patient Questions:  1.  Have you seen any other providers since your last visit? No  2.  Any changes in your medicines or health? No    PCP : [name], MD  Allergies:   Allergies  Allergen Reactions  . Bee Venom     Throat swelling  . Mushroom Extract Complex Anaphylaxis  . Penicillins Anaphylaxis, Hives and Swelling    Has patient had a PCN reaction causing immediate rash, facial/tongue/throat swelling, SOB or lightheadedness with hypotension: Yes Has patient had a PCN reaction causing severe rash involving mucus membranes or skin necrosis: Yes Has patient had a PCN reaction that required hospitalization Yes Has patient had a PCN reaction occurring within the last 10 years: No If all of the above answers are "NO", then may proceed with Cephalosporin use.   . Codeine Nausea And Vomiting  . Sulfa Antibiotics Nausea And Vomiting  . Iohexol   . Erythromycin Base Rash    Medications: Outpatient Encounter Medications as of 10/20/2019  Medication Sig  . acetaminophen (TYLENOL) 500 MG tablet Take 500 mg by mouth as needed.  Marland Kitchen albuterol (PROAIR HFA) 108 (90 Base) MCG/ACT inhaler Inhale 2 puffs into the lungs every 6 (six) hours as needed for wheezing or shortness of breath.  Marland Kitchen apixaban (ELIQUIS) 5 MG TABS tablet Take 1 tablet (5 mg total) by mouth 2 (two) times daily.  Marland Kitchen atorvastatin (LIPITOR) 40 MG tablet TAKE 1 TABLET BY MOUTH EVERYDAY AT BEDTIME  . Blood Glucose Monitoring Suppl (ONE TOUCH ULTRA 2) w/Device KIT Use as instructed to check blood sugar 2 times daily as needed.  . clopidogrel (PLAVIX) 75 MG tablet Take 1 tablet (75 mg total) by mouth daily.  . Cyanocobalamin (VITAMIN B12) 500 MCG TABS Take 2 tablets by mouth daily.  . dapagliflozin propanediol (FARXIGA) 5 MG TABS tablet Take 1 tablet (5 mg total) by mouth daily before  breakfast.  . docusate sodium (COLACE) 100 MG capsule Take 1 capsule (100 mg total) by mouth daily. (Patient taking differently: Take 100 mg by mouth daily as needed for mild constipation. )  . furosemide (LASIX) 20 MG tablet TAKE 1 TABLET BY MOUTH EVERY DAY  . gabapentin (NEURONTIN) 300 MG capsule Take 1 capsule (300 mg total) by mouth 2 (two) times daily.  Marland Kitchen glipiZIDE (GLUCOTROL) 5 MG tablet TAKE 1 TABLET (5 MG TOTAL) BY MOUTH 2 (TWO) TIMES DAILY BEFORE A MEAL.  Marland Kitchen glucose blood (ONETOUCH ULTRA) test strip Use as instructed to check blood sugar 2 times daily as needed  . HYDROcodone-acetaminophen (NORCO/VICODIN) 5-325 MG tablet Take 1 tablet by mouth every 6 (six) hours as needed (breakthrough pain).  . isosorbide mononitrate (IMDUR) 30 MG 24 hr tablet Take 0.5 tablets (15 mg total) by mouth daily.  Marland Kitchen latanoprost (XALATAN) 0.005 % ophthalmic solution Place 1 drop into both eyes at bedtime.   . metFORMIN (GLUCOPHAGE) 500 MG tablet Take 1 tablet (500 mg total) by mouth 3 (three) times daily before meals.  . metoCLOPramide (REGLAN) 10 MG tablet Take 1 tablet (10 mg total) by mouth every 6 (six) hours as needed for nausea or vomiting.  . metoprolol tartrate (LOPRESSOR) 25 MG tablet TAKE 1/2 TABLET BY MOUTH DAILY  . NON FORMULARY Antifungal cream from [place] as needed  . ondansetron (ZOFRAN) 4 MG tablet Take 1 tablet (4 mg total) by  mouth every 8 (eight) hours as needed for nausea or vomiting.  . ondansetron (ZOFRAN-ODT) 4 MG disintegrating tablet Take 4 mg by mouth every 6 (six) hours as needed for nausea or vomiting.  Glory Rosebush Delica Lancets 15B MISC Use as directed to check blood sugar 2 times daily as needed  . pantoprazole (PROTONIX) 40 MG tablet Take 1 tablet (40 mg total) by mouth 2 (two) times daily.  . traZODone (DESYREL) 50 MG tablet Take 1.5 tablets (75 mg total) by mouth at bedtime as needed for sleep.   No facility-administered encounter medications on file as of 10/20/2019.     Current Diagnosis: Patient Active Problem List   Diagnosis Date Noted  . Right sided temporal headache 08/06/2019  . Atrial flutter with rapid ventricular response ([place])   . Acute deep vein thrombosis (DVT) of left tibial vein ([place]) 07/11/2019  . Acute cholecystitis 07/07/2019  . Pain of right breast 03/15/2019  . Health maintenance examination 12/11/2018  . Type 2 diabetes mellitus with severe nonproliferative diabetic retinopathy with macular edema, bilateral ([place]) 07/16/2018  . Coronary artery disease of native artery of native heart with stable angina pectoris ([place])   . Effort angina ([place]) 11/12/2017  . Abnormal stress ECG   . Dyspnea on exertion 11/04/2017  . GERD (gastroesophageal reflux disease) 08/15/2017  . Trigger finger 06/09/2017  . Nocturnal leg movements 03/25/2017  . Osteopenia 02/01/2017  . Advanced care planning/counseling discussion 01/02/2017  . Stage 3 chronic kidney disease due to type 2 diabetes mellitus ([place]) 08/26/2016  . Insomnia 08/26/2016  . Vitamin B12 deficiency 11/10/2015  . General weakness 08/04/2015  . Epistaxis 07/07/2015  . Fatty liver disease, nonalcoholic 26/20/3559  . Chronic diastolic CHF (congestive heart failure) ([place]) 08/27/2014  . Positive hepatitis C antibody test 08/27/2014  . Iron deficiency 08/27/2014  . NSVT (nonsustained ventricular tachycardia) ([place])   . Nausea without vomiting   . Autonomic orthostatic hypotension 07/11/2014  . S/P CABG x 3 07/08/2014  . History of cerebrovascular accident (CVA) in adulthood 07/08/2014  . DM (diabetes mellitus), type 2, uncontrolled w/neurologic complication ([place]) 74/16/3845  . Itching 03/16/2012  . Rash 03/16/2012  . Medicare annual wellness visit, initial 10/28/2011  . Hyperlipidemia associated with type 2 diabetes mellitus ([place])   . Idiopathic angioedema 06/23/2011  . Vitiligo 06/23/2011  . Dermatitis 06/23/2011  . MDD (major depressive disorder), recurrent severe, without psychosis  ([place]) 05/08/2006  . Hypertension, essential 05/08/2006  . MENOPAUSAL SYNDROME 05/08/2006    Goals Addressed   None     Follow-Up:  Coordination of Enhanced Pharmacy Services  Reviewed chart for medication changes ahead of medication coordination call.  No OVs, Consults, or hospital visits since last care coordination call/Pharmacist visit.  No medication changes indicated  BP Readings from Last 3 Encounters:  08/03/19 (!) 144/84  07/23/19 118/65  04/08/19 126/75    Lab Results  Component Value Date   HGBA1C 10.8 (H) 08/03/2019     Patient obtains medications through Vials  90 Days   Last adherence delivery included:  Atorvastatin 40 mg dailly Clopidogrel '75mg'$  daily Furosemide 20 mg daily Gabapentin 100 mg 1 breakfast, 1 lunch, 3 bedtime Glipizide 5 mg BID Isosorbide Mononitrate 30 mg daily Metformin 500 mg three times daily Latanoprost eye drops daily Metoprolol 25 mg daily Pantoprazole 20 mg daily Trazadone 50 mg daily   Patient declined  Farxiga '5mg'$  daily last month due to additional supply on hand. Had a 90 day supply sent in by local pharmacy.  Will be included in this delivery.   Patient is due for next adherence delivery on: 10/22/2019. Called patient and reviewed medications and coordinated delivery.  This delivery to include: . Atorvastatin 40 mg - 1 bedtime . Clopidogrel 75 mg - 1 breakfast . Furosemide 20 mg- 1 breakfast . Gabapentin 100 mg- 1 breakfast, 1 lunch, 3 bedtimes . Glipizide 5 mg - 1 breakfast, 1 evening meal . Isosorbide Mononitrate 30 mg - 0.5 breakfast . Metformin 500 mg - 1 breakfast, 1 lunch, 1 evening meal  . Latanoprost 0.005 % eye drops . Metoprolol tartrate 25 mg 0.5 breakfast . Pantoprazole 20 mg - 1 breakfast  . TRAZADONE 50 MG - 1.5 at bedtime  . Farxiga 5 mg - before breakfast    Patient does not need refills at this time.  Confirmed delivery date of 10/22/2019, advised patient that pharmacy will contact them the morning of  delivery.  Martinique Uselman, [place] Pharmacist Assistant  [phone]

## 2019-10-21 ENCOUNTER — Telehealth: Payer: Self-pay

## 2019-10-21 DIAGNOSIS — IMO0002 Reserved for concepts with insufficient information to code with codable children: Secondary | ICD-10-CM

## 2019-10-21 NOTE — Telephone Encounter (Signed)
"  Rejection Reason - Patient Declined" [facility] said about 18 hours ago  Patient was feeling ill, and cancelled appt. States she will call back when she is feeling better.

## 2019-10-31 NOTE — Telephone Encounter (Signed)
Please call - how is R sided temporal headache?  If ongoing, please encourage her to call and reschedule neurology appt - she had called and cancelled earlier this month because she was feeling ill.

## 2019-11-03 MED ORDER — ONETOUCH ULTRA 2 W/DEVICE KIT: PACK | 0 refills | Status: DC

## 2019-11-03 NOTE — Telephone Encounter (Signed)
Pt returned yoru call  Best number [phone]

## 2019-11-03 NOTE — Telephone Encounter (Signed)
Lvm asking pt to call back.  Need to relay Dr. G's message.  

## 2019-11-03 NOTE — Telephone Encounter (Signed)
Spoke with pt asking about HA.  States she is doing much better.  I relayed Dr. Synthia Innocent message.  Pt verbalizes understanding. FYI to Dr. [name].   Pt mentioned she has misplaced glucometer.  She request a new one from [facility].  E-scribed refill.

## 2019-11-03 NOTE — Addendum Note (Signed)
Addended by: Brenton Grills on: 4/71/2527 12:92 PM   Modules accepted: Orders

## 2019-11-08 ENCOUNTER — Other Ambulatory Visit: Payer: Self-pay

## 2019-11-08 ENCOUNTER — Encounter: Payer: Self-pay | Admitting: Family Medicine

## 2019-11-08 ENCOUNTER — Ambulatory Visit (INDEPENDENT_AMBULATORY_CARE_PROVIDER_SITE_OTHER): Payer: Medicare HMO | Admitting: Family Medicine

## 2019-11-08 VITALS — BP 122/70 | HR 87 | Temp 97.9°F | Ht 62.0 in | Wt 146.4 lb

## 2019-11-08 DIAGNOSIS — E1149 Type 2 diabetes mellitus with other diabetic neurological complication: Secondary | ICD-10-CM | POA: Diagnosis not present

## 2019-11-08 DIAGNOSIS — Z951 Presence of aortocoronary bypass graft: Secondary | ICD-10-CM | POA: Diagnosis not present

## 2019-11-08 DIAGNOSIS — E1165 Type 2 diabetes mellitus with hyperglycemia: Secondary | ICD-10-CM | POA: Diagnosis not present

## 2019-11-08 DIAGNOSIS — R300 Dysuria: Secondary | ICD-10-CM

## 2019-11-08 DIAGNOSIS — Z8673 Personal history of transient ischemic attack (TIA), and cerebral infarction without residual deficits: Secondary | ICD-10-CM

## 2019-11-08 DIAGNOSIS — R519 Headache, unspecified: Secondary | ICD-10-CM

## 2019-11-08 DIAGNOSIS — E113413 Type 2 diabetes mellitus with severe nonproliferative diabetic retinopathy with macular edema, bilateral: Secondary | ICD-10-CM

## 2019-11-08 DIAGNOSIS — IMO0002 Reserved for concepts with insufficient information to code with codable children: Secondary | ICD-10-CM

## 2019-11-08 DIAGNOSIS — I82442 Acute embolism and thrombosis of left tibial vein: Secondary | ICD-10-CM

## 2019-11-08 DIAGNOSIS — L80 Vitiligo: Secondary | ICD-10-CM

## 2019-11-08 LAB — POCT GLYCOSYLATED HEMOGLOBIN (HGB A1C): Hemoglobin A1C: 11.2 % — AB (ref 4.0–5.6)

## 2019-11-08 LAB — POC URINALSYSI DIPSTICK (AUTOMATED)
Bilirubin, UA: NEGATIVE
Blood, UA: NEGATIVE
Glucose, UA: POSITIVE — AB
Ketones, UA: NEGATIVE
Leukocytes, UA: NEGATIVE
Nitrite, UA: POSITIVE
Protein, UA: NEGATIVE
Spec Grav, UA: 1.015 (ref 1.010–1.025)
Urobilinogen, UA: 0.2 E.U./dL
pH, UA: 5.5 (ref 5.0–8.0)

## 2019-11-08 MED ORDER — BASAGLAR KWIKPEN 100 UNIT/ML ~~LOC~~ SOPN: 10.0000 [IU] | PEN_INJECTOR | Freq: Every day | SUBCUTANEOUS | 3 refills | Status: DC

## 2019-11-08 NOTE — Assessment & Plan Note (Signed)
Describes catching sharp pain at end of stream but no other urinary symptoms. Check UA (on SGLT2).

## 2019-11-08 NOTE — Patient Instructions (Addendum)
Urinalysis today as well as urine microalbumin Use old meter to check sugars daily. Call to schedule appointment with eye doctor.  I recommend starting basaglar long acting insulin - start 10 units daily. Have pharmacist do insulin teaching for you or return here for nurse visit for insulin teaching - bring new insulin pen.  Return in 1 month for diabetes follow up with sugar log.

## 2019-11-08 NOTE — Assessment & Plan Note (Signed)
Treating DVT and presumed PE during recent hospitalization with eliquis - started early 07/2019, would recommend 6 months of treatment. Aspirin was stopped while on eliquis. She continues plavix.

## 2019-11-08 NOTE — Progress Notes (Signed)
This visit was conducted in person.  BP 122/70 (BP Location: Right Arm, Patient Position: Sitting, Cuff Size: Normal)   Pulse 87   Temp 97.9 F (36.6 C) (Temporal)   Ht '5\' 2"'  (1.575 m)   Wt 146 lb 6 oz (66.4 kg)   SpO2 96%   BMI 26.77 kg/m    CC: 3 mo f/u visit  Subjective:    Patient ID: Maria Fox, female    DOB: 1945/05/07, 74 y.o.   MRN: [id number]  HPI: Maria Fox is a 74 y.o. female presenting on 11/08/2019 for Follow-up (Here for 3 mo f/u.)   Notes sharp pain at end of stream. No blood in urine.  Headaches have resolved.    DM - does not regularly check sugars - lost her meter. Compliant with antihyperglycemic regimen which includes: farxiga 33m daily, glipizide 537mbid, metformin 50027mID. Tolerating this regimen well. Wants to avoid injections at all costs. Denies low sugars or hypoglycemic symptoms. Denies paresthesias. Last diabetic eye exam DUE. Pneumovax: 2012. Prevnar: 2018. Glucometer brand: onetouch. DSME: endorses completed remotely.  Lab Results  Component Value Date   HGBA1C 11.2 (A) 11/08/2019   Diabetic Foot Exam - Simple   Simple Foot Form Diabetic Foot exam was performed with the following findings: Yes 11/08/2019  4:51 PM  Visual Inspection No deformities, no ulcerations, no other skin breakdown bilaterally: Yes Sensation Testing Intact to touch and monofilament testing bilaterally: Yes Pulse Check Posterior Tibialis and Dorsalis pulse intact bilaterally: Yes Comments    Lab Results  Component Value Date   MICROALBUR <0.7 07/16/2018        Relevant past medical, surgical, family and social history reviewed and updated as indicated. Interim medical history since our last visit reviewed. Allergies and medications reviewed and updated. Outpatient Medications Prior to Visit  Medication Sig Dispense Refill  . acetaminophen (TYLENOL) 500 MG tablet Take 500 mg by mouth as needed.    . aMarland Kitchenbuterol (PROAIR HFA) 108 (90 Base) MCG/ACT inhaler Inhale  2 puffs into the lungs every 6 (six) hours as needed for wheezing or shortness of breath. 1 Inhaler 1  . apixaban (ELIQUIS) 5 MG TABS tablet Take 1 tablet (5 mg total) by mouth 2 (two) times daily. 184 tablet 0  . atorvastatin (LIPITOR) 40 MG tablet TAKE 1 TABLET BY MOUTH EVERYDAY AT BEDTIME 90 tablet 3  . Blood Glucose Monitoring Suppl (ONE TOUCH ULTRA 2) w/Device KIT Use as instructed to check blood sugar 2 times daily as needed. 1 kit 0  . clopidogrel (PLAVIX) 75 MG tablet Take 1 tablet (75 mg total) by mouth daily. 90 tablet 1  . Cyanocobalamin (VITAMIN B12) 500 MCG TABS Take 2 tablets by mouth daily.    . dapagliflozin propanediol (FARXIGA) 5 MG TABS tablet Take 1 tablet (5 mg total) by mouth daily before breakfast. 30 tablet 6  . docusate sodium (COLACE) 100 MG capsule Take 1 capsule (100 mg total) by mouth daily. (Patient taking differently: Take 100 mg by mouth daily as needed for mild constipation. ) 30 capsule 6  . furosemide (LASIX) 20 MG tablet TAKE 1 TABLET BY MOUTH EVERY DAY 90 tablet 3  . gabapentin (NEURONTIN) 300 MG capsule Take 1 capsule (300 mg total) by mouth 2 (two) times daily. 60 capsule 11  . glipiZIDE (GLUCOTROL) 5 MG tablet TAKE 1 TABLET (5 MG TOTAL) BY MOUTH 2 (TWO) TIMES DAILY BEFORE A MEAL. 180 tablet 1  . glucose blood (ONETOUCH ULTRA) test strip  Use as instructed to check blood sugar 2 times daily as needed 100 strip 3  . HYDROcodone-acetaminophen (NORCO/VICODIN) 5-325 MG tablet Take 1 tablet by mouth every 6 (six) hours as needed (breakthrough pain). 15 tablet 0  . isosorbide mononitrate (IMDUR) 30 MG 24 hr tablet Take 0.5 tablets (15 mg total) by mouth daily. 90 tablet 3  . latanoprost (XALATAN) 0.005 % ophthalmic solution Place 1 drop into both eyes at bedtime.     . metFORMIN (GLUCOPHAGE) 500 MG tablet Take 1 tablet (500 mg total) by mouth 3 (three) times daily before meals. 270 tablet 1  . metoCLOPramide (REGLAN) 10 MG tablet Take 1 tablet (10 mg total) by mouth  every 6 (six) hours as needed for nausea or vomiting. 120 tablet 3  . metoprolol tartrate (LOPRESSOR) 25 MG tablet TAKE 1/2 TABLET BY MOUTH DAILY 45 tablet 3  . NON FORMULARY Antifungal cream from [place] as needed    . ondansetron (ZOFRAN) 4 MG tablet Take 1 tablet (4 mg total) by mouth every 8 (eight) hours as needed for nausea or vomiting. 20 tablet 0  . ondansetron (ZOFRAN-ODT) 4 MG disintegrating tablet Take 4 mg by mouth every 6 (six) hours as needed for nausea or vomiting.    Glory Rosebush Delica Lancets 44B MISC Use as directed to check blood sugar 2 times daily as needed 100 each 3  . pantoprazole (PROTONIX) 40 MG tablet Take 1 tablet (40 mg total) by mouth 2 (two) times daily. 60 tablet 6  . traZODone (DESYREL) 50 MG tablet Take 1.5 tablets (75 mg total) by mouth at bedtime as needed for sleep. 135 tablet 1   No facility-administered medications prior to visit.     Per HPI unless specifically indicated in ROS section below Review of Systems Objective:  BP 122/70 (BP Location: Right Arm, Patient Position: Sitting, Cuff Size: Normal)   Pulse 87   Temp 97.9 F (36.6 C) (Temporal)   Ht '5\' 2"'  (1.575 m)   Wt 146 lb 6 oz (66.4 kg)   SpO2 96%   BMI 26.77 kg/m   Wt Readings from Last 3 Encounters:  11/08/19 146 lb 6 oz (66.4 kg)  08/03/19 146 lb 8 oz (66.5 kg)  07/23/19 146 lb 8 oz (66.5 kg)      Physical Exam Vitals and nursing note reviewed.  Constitutional:      General: She is not in acute distress.    Appearance: She is well-developed.  HENT:     Head: Normocephalic and atraumatic.     Right Ear: External ear normal.     Left Ear: External ear normal.     Nose: Nose normal.     Mouth/Throat:     Pharynx: No oropharyngeal exudate.  Eyes:     General: No scleral icterus.    Conjunctiva/sclera: Conjunctivae normal.     Pupils: Pupils are equal, round, and reactive to light.  Cardiovascular:     Rate and Rhythm: Normal rate and regular rhythm.     Pulses:  Normal pulses.     Heart sounds: Normal heart sounds. No murmur heard.   Pulmonary:     Effort: Pulmonary effort is normal. No respiratory distress.     Breath sounds: Normal breath sounds. No wheezing, rhonchi or rales.  Musculoskeletal:     Cervical back: Normal range of motion and neck supple.     Right lower leg: No edema.     Left lower leg: No edema.  Comments: See HPI for foot exam if done  Lymphadenopathy:     Cervical: No cervical adenopathy.  Skin:    General: Skin is warm and dry.     Findings: Erythema and rash present.     Comments: Vitiligo, dark skin from sun exposure, areas of vitiligo with skin burn especially R upper arm  Psychiatric:        Mood and Affect: Mood normal.        Behavior: Behavior normal.       Results for orders placed or performed in visit on 11/08/19  POCT glycosylated hemoglobin (Hb A1C)  Result Value Ref Range   Hemoglobin A1C 11.2 (A) 4.0 - 5.6 %   HbA1c POC (<> result, manual entry)     HbA1c, POC (prediabetic range)     HbA1c, POC (controlled diabetic range)    POCT Urinalysis Dipstick (Automated)  Result Value Ref Range   Color, UA yellow    Clarity, UA clear    Glucose, UA Positive (A) Negative   Bilirubin, UA negative    Ketones, UA negative    Spec Grav, UA 1.015 1.010 - 1.025   Blood, UA negative    pH, UA 5.5 5.0 - 8.0   Protein, UA Negative Negative   Urobilinogen, UA 0.2 0.2 or 1.0 E.U./dL   Nitrite, UA positive    Leukocytes, UA Negative Negative   Assessment & Plan:  This visit occurred during the SARS-CoV-2 public health emergency.  Safety protocols were in place, including screening questions prior to the visit, additional usage of staff PPE, and extensive cleaning of exam room while observing appropriate contact time as indicated for disinfecting solutions.   Problem List Items Addressed This Visit    Vitiligo (Chronic)    Reviewed importance of avoiding sunburn.  Encouraged regular sunscreen use.        Type 2 diabetes mellitus with severe nonproliferative diabetic retinopathy with macular edema, bilateral ([place])    Overdue for f/u.       Relevant Medications   Insulin Glargine (BASAGLAR KWIKPEN) 100 UNIT/ML   Other Relevant Orders   POCT glycosylated hemoglobin (Hb A1C) (Completed)   Microalbumin / creatinine urine ratio   S/P CABG x 3    Reviewed importance of diabetes control to prevent future MI      Right sided temporal headache    This has resolved.      History of cerebrovascular accident (CVA) in adulthood    Reviewed importance of diabetes control to prevent future CVA.       Dysuria    Describes catching sharp pain at end of stream but no other urinary symptoms. Check UA (on SGLT2).       Relevant Orders   Urine Culture   DM (diabetes mellitus), type 2, uncontrolled w/neurologic complication ([place]) - Primary    Persistently poor control despite reported compliance with farxiga, glipizide, metformin. Very hesitant for injectable medication. Likely not good candidate for SGLT2 injections given concerns for gastroparesis and recent cholecystectomy. She agrees to try basal insulin - will start basaglar 10u daily (seems to be on formulary). Discussed returning or asking pharmacist for insulin teaching - she previously injected insulin for her diabetic mother.  She will start using old glucometer to monitor sugars. RTC 1 mo with sugar log.       Relevant Medications   Insulin Glargine (BASAGLAR KWIKPEN) 100 UNIT/ML   Other Relevant Orders   POCT Urinalysis Dipstick (Automated) (Completed)   Acute deep vein  thrombosis (DVT) of left tibial vein ([name])    Treating DVT and presumed PE during recent hospitalization with eliquis - started early 07/2019, would recommend 6 months of treatment. Aspirin was stopped while on eliquis. She continues plavix.           Meds ordered this encounter  Medications  . Insulin Glargine (BASAGLAR KWIKPEN) 100 UNIT/ML    Sig: Inject 0.1 mLs  (10 Units total) into the skin daily.    Dispense:  3 mL    Refill:  3    New insulin start   Orders Placed This Encounter  Procedures  . Urine Culture  . Microalbumin / creatinine urine ratio  . POCT glycosylated hemoglobin (Hb A1C)  . POCT Urinalysis Dipstick (Automated)    Patient Instructions  Urinalysis today as well as urine microalbumin Use old meter to check sugars daily. Call to schedule appointment with eye doctor.  I recommend starting basaglar long acting insulin - start 10 units daily. Have pharmacist do insulin teaching for you or return here for nurse visit for insulin teaching - bring new insulin pen.  Return in 1 month for diabetes follow up with sugar log.   Follow up plan: Return in about 4 weeks (around 12/06/2019) for follow up visit.  [name], MD

## 2019-11-08 NOTE — Assessment & Plan Note (Signed)
Reviewed importance of diabetes control to prevent future MI

## 2019-11-08 NOTE — Assessment & Plan Note (Signed)
This has resolved.

## 2019-11-08 NOTE — Assessment & Plan Note (Signed)
Persistently poor control despite reported compliance with farxiga, glipizide, metformin. Very hesitant for injectable medication. Likely not good candidate for SGLT2 injections given concerns for gastroparesis and recent cholecystectomy. She agrees to try basal insulin - will start basaglar 10u daily (seems to be on formulary). Discussed returning or asking pharmacist for insulin teaching - she previously injected insulin for her diabetic mother.  She will start using old glucometer to monitor sugars. RTC 1 mo with sugar log.

## 2019-11-08 NOTE — Assessment & Plan Note (Signed)
Overdue for f/u

## 2019-11-08 NOTE — Assessment & Plan Note (Signed)
Reviewed importance of diabetes control to prevent future CVA.

## 2019-11-08 NOTE — Assessment & Plan Note (Addendum)
Reviewed importance of avoiding sunburn.  Encouraged regular sunscreen use.

## 2019-11-09 LAB — MICROALBUMIN / CREATININE URINE RATIO
Creatinine,U: 58.5 mg/dL
Microalb Creat Ratio: 1.2 mg/g (ref 0.0–30.0)
Microalb, Ur: <0.7 mg/dL (ref 0.0–1.9)

## 2019-11-11 ENCOUNTER — Other Ambulatory Visit: Payer: Self-pay | Admitting: Family Medicine

## 2019-11-11 ENCOUNTER — Other Ambulatory Visit: Payer: Self-pay

## 2019-11-11 LAB — URINE CULTURE
MICRO NUMBER:: 10888353
SPECIMEN QUALITY:: ADEQUATE

## 2019-11-11 MED ORDER — NITROFURANTOIN MONOHYD MACRO 100 MG PO CAPS: 100.0000 mg | ORAL_CAPSULE | Freq: Two times a day (BID) | ORAL | 0 refills | Status: DC

## 2019-11-11 MED ORDER — NITROFURANTOIN MONOHYD MACRO 100 MG PO CAPS
100.0000 mg | ORAL_CAPSULE | Freq: Two times a day (BID) | ORAL | 0 refills | Status: DC
Start: 2019-11-11 — End: 2019-11-11

## 2019-11-11 NOTE — Telephone Encounter (Signed)
While relaying lab results to pt and notifying Dr. [name] sent  [facility], pt stated it needs to go to [facility].  Notified pt I will send rx to [facility].  Expresses her thanks.   E-scribed abx to [facility].  Spoke with [facility] to c/x rx.  Says they will c/x.

## 2019-11-15 ENCOUNTER — Encounter: Payer: Self-pay | Admitting: Family Medicine

## 2019-11-23 ENCOUNTER — Ambulatory Visit: Payer: Medicare HMO | Admitting: Podiatry

## 2019-11-26 ENCOUNTER — Telehealth: Payer: Self-pay

## 2019-11-26 NOTE — Progress Notes (Signed)
Unsuccessful outreach to patient regarding options for glucometer. Left Voicemail for patient to return call on 11/26/19 at 12:50PM.   Martinique Uselman, [place] Pharmacist Assistant  [phone]

## 2019-11-30 ENCOUNTER — Ambulatory Visit: Payer: Medicare HMO | Admitting: Cardiology

## 2019-12-13 ENCOUNTER — Telehealth: Payer: Self-pay

## 2019-12-13 NOTE — Chronic Care Management (AMB) (Signed)
Chronic Care Management Pharmacy Assistant   Name: Maria Fox  MRN: [id number] DOB: February 07, 1946  Reason for Encounter: Medication Review / Monthly Dispensing Call  PCP : [name], MD  Allergies:   Allergies  Allergen Reactions  . Bee Venom     Throat swelling  . Mushroom Extract Complex Anaphylaxis  . Penicillins Anaphylaxis, Hives and Swelling    Has patient had a PCN reaction causing immediate rash, facial/tongue/throat swelling, SOB or lightheadedness with hypotension: Yes Has patient had a PCN reaction causing severe rash involving mucus membranes or skin necrosis: Yes Has patient had a PCN reaction that required hospitalization Yes Has patient had a PCN reaction occurring within the last 10 years: No If all of the above answers are "NO", then may proceed with Cephalosporin use.   . Codeine Nausea And Vomiting  . Sulfa Antibiotics Nausea And Vomiting  . Iohexol   . Erythromycin Base Rash    Medications: Outpatient Encounter Medications as of 12/13/2019  Medication Sig  . acetaminophen (TYLENOL) 500 MG tablet Take 500 mg by mouth as needed.  Marland Kitchen albuterol (PROAIR HFA) 108 (90 Base) MCG/ACT inhaler Inhale 2 puffs into the lungs every 6 (six) hours as needed for wheezing or shortness of breath.  Marland Kitchen apixaban (ELIQUIS) 5 MG TABS tablet Take 1 tablet (5 mg total) by mouth 2 (two) times daily.  Marland Kitchen atorvastatin (LIPITOR) 40 MG tablet TAKE 1 TABLET BY MOUTH EVERYDAY AT BEDTIME  . Blood Glucose Monitoring Suppl (ONE TOUCH ULTRA 2) w/Device KIT Use as instructed to check blood sugar 2 times daily as needed.  . clopidogrel (PLAVIX) 75 MG tablet Take 1 tablet (75 mg total) by mouth daily.  . Cyanocobalamin (VITAMIN B12) 500 MCG TABS Take 2 tablets by mouth daily.  . dapagliflozin propanediol (FARXIGA) 5 MG TABS tablet Take 1 tablet (5 mg total) by mouth daily before breakfast.  . docusate sodium (COLACE) 100 MG capsule Take 1 capsule (100 mg total) by mouth daily. (Patient  taking differently: Take 100 mg by mouth daily as needed for mild constipation. )  . furosemide (LASIX) 20 MG tablet TAKE 1 TABLET BY MOUTH EVERY DAY  . gabapentin (NEURONTIN) 300 MG capsule Take 1 capsule (300 mg total) by mouth 2 (two) times daily.  Marland Kitchen glipiZIDE (GLUCOTROL) 5 MG tablet TAKE 1 TABLET (5 MG TOTAL) BY MOUTH 2 (TWO) TIMES DAILY BEFORE A MEAL.  Marland Kitchen glucose blood (ONETOUCH ULTRA) test strip Use as instructed to check blood sugar 2 times daily as needed  . HYDROcodone-acetaminophen (NORCO/VICODIN) 5-325 MG tablet Take 1 tablet by mouth every 6 (six) hours as needed (breakthrough pain).  . Insulin Glargine (BASAGLAR KWIKPEN) 100 UNIT/ML Inject 0.1 mLs (10 Units total) into the skin daily.  . isosorbide mononitrate (IMDUR) 30 MG 24 hr tablet Take 0.5 tablets (15 mg total) by mouth daily.  Marland Kitchen latanoprost (XALATAN) 0.005 % ophthalmic solution Place 1 drop into both eyes at bedtime.   . metFORMIN (GLUCOPHAGE) 500 MG tablet Take 1 tablet (500 mg total) by mouth 3 (three) times daily before meals.  . metoCLOPramide (REGLAN) 10 MG tablet Take 1 tablet (10 mg total) by mouth every 6 (six) hours as needed for nausea or vomiting.  . metoprolol tartrate (LOPRESSOR) 25 MG tablet TAKE 1/2 TABLET BY MOUTH DAILY  . nitrofurantoin, macrocrystal-monohydrate, (MACROBID) 100 MG capsule Take 1 capsule (100 mg total) by mouth 2 (two) times daily.  . NON FORMULARY Antifungal cream from [place] as needed  .  ondansetron (ZOFRAN) 4 MG tablet Take 1 tablet (4 mg total) by mouth every 8 (eight) hours as needed for nausea or vomiting.  . ondansetron (ZOFRAN-ODT) 4 MG disintegrating tablet Take 4 mg by mouth every 6 (six) hours as needed for nausea or vomiting.  Glory Rosebush Delica Lancets 48J MISC Use as directed to check blood sugar 2 times daily as needed  . pantoprazole (PROTONIX) 40 MG tablet Take 1 tablet (40 mg total) by mouth 2 (two) times daily.  . traZODone (DESYREL) 50 MG tablet Take 1.5 tablets (75  mg total) by mouth at bedtime as needed for sleep.   No facility-administered encounter medications on file as of 12/13/2019.    Current Diagnosis: Patient Active Problem List   Diagnosis Date Noted  . Dysuria 11/08/2019  . Right sided temporal headache 08/06/2019  . Atrial flutter with rapid ventricular response ([place])   . Acute deep vein thrombosis (DVT) of left tibial vein ([place]) 07/11/2019  . Acute cholecystitis 07/07/2019  . Pain of right breast 03/15/2019  . Health maintenance examination 12/11/2018  . Type 2 diabetes mellitus with severe nonproliferative diabetic retinopathy with macular edema, bilateral ([place]) 07/16/2018  . Coronary artery disease of native artery of native heart with stable angina pectoris ([place])   . Effort angina ([place]) 11/12/2017  . Abnormal stress ECG   . Dyspnea on exertion 11/04/2017  . GERD (gastroesophageal reflux disease) 08/15/2017  . Trigger finger 06/09/2017  . Nocturnal leg movements 03/25/2017  . Osteopenia 02/01/2017  . Advanced care planning/counseling discussion 01/02/2017  . Stage 3 chronic kidney disease due to type 2 diabetes mellitus ([place]) 08/26/2016  . Insomnia 08/26/2016  . Vitamin B12 deficiency 11/10/2015  . General weakness 08/04/2015  . Epistaxis 07/07/2015  . Fatty liver disease, nonalcoholic 85/63/1497  . Chronic diastolic CHF (congestive heart failure) ([place]) 08/27/2014  . Positive hepatitis C antibody test 08/27/2014  . Iron deficiency 08/27/2014  . NSVT (nonsustained ventricular tachycardia) ([place])   . Nausea without vomiting   . Autonomic orthostatic hypotension 07/11/2014  . S/P CABG x 3 07/08/2014  . History of cerebrovascular accident (CVA) in adulthood 07/08/2014  . DM (diabetes mellitus), type 2, uncontrolled w/neurologic complication ([place]) 02/63/7858  . Itching 03/16/2012  . Rash 03/16/2012  . Medicare annual wellness visit, initial 10/28/2011  . Hyperlipidemia associated with type 2 diabetes mellitus ([place])   .  Idiopathic angioedema 06/23/2011  . Vitiligo 06/23/2011  . Dermatitis 06/23/2011  . MDD (major depressive disorder), recurrent severe, without psychosis ([place]) 05/08/2006  . Hypertension, essential 05/08/2006  . MENOPAUSAL SYNDROME 05/08/2006    Goals Addressed   None    Reviewed chart for medication changes ahead of medication coordination call.  OV- 11/08/2019- Dr [name] (PCP) since last care coordination call/Pharmacist visit.  Medication changes indicated: Basaglar KwikPen 10 units sq daily started on 11/08/2019.   BP Readings from Last 3 Encounters:  11/08/19 122/70  08/03/19 (!) 144/84  07/23/19 118/65    Lab Results  Component Value Date   HGBA1C 11.2 (A) 11/08/2019     Patient obtains medications through Adherence Packaging  90 Days   Last adherence delivery included: Atorvastatin 40 mg - 1 tablet daily (bedtime), Clopidogrel 75 mg - 1 tablet daily (breakfast), Furosemide 20 mg- 1 tablet daily (breakfast), Gabapentin 100 mg- 1 table twice daily and 3 tablet nightly (breakfast, lunch, bedtime), Glipizide 5 mg - 1 table twice daily ( breakfast, evening meal), Isosorbide Mononitrate 30 mg - 0.5 tablet daily (breakfast), Metformin 500 mg -  1 tablet 3 times a day (breakfast, lunch, evening meal ), Latanoprost 0.005 % eye drops- 1 drop each eye daily, Metoprolol tartrate 25 mg 0.5 tablet daily (breakfast), Pantoprazole 20 mg - 1 tablet daily (breakfast), Trazodone 50 mg- 1.5 tablet nightly (bedtime), Farxiga 5 mg - 1 tablet daily (before breakfast).   Patient did not decline any medications last month.   Patient is due for next adherence delivery on: 12/21/2019. Called patient and reviewed medications and coordinated delivery.  This delivery to include: Ondansetron HCL 4 mg- 1 tablet every 6 hours as needed for nausea, Reli-on Meter to use to check blood sugars twice daily.  Coordinated acute fill for Basaglar 100 u/ml- Inject 10 units sq daily  to be delivered  01/08/20.  Patient declined the following medications: Atorvastatin 40 mg - 1 tablet daily (bedtime), Clopidogrel 75 mg - 1 tablet daily (breakfast), Furosemide 20 mg- 1 tablet daily (breakfast), Gabapentin 100 mg- 1 table twice daily and 3 tablet nightly (breakfast, lunch, bedtime), Glipizide 5 mg - 1 table twice daily ( breakfast, evening meal), Isosorbide Mononitrate 30 mg - 0.5 tablet daily (breakfast), Metformin 500 mg - 1 tablet 3 times a day (breakfast, lunch, evening meal ), Latanoprost 0.005 % eye drops- 1 drop each eye daily, Metoprolol tartrate 25 mg 0.5 tablet daily (breakfast), Pantoprazole 20 mg - 1 tablet daily (breakfast), Trazodone 50 mg- 1.5 tablet nightly (bedtime), Farxiga 5 mg - 1 tablet daily (before breakfast) due to receiving a 90 day supply on 10/20/2019 and Hydrocodone 5/325 mg- 1 tablet every 6 hours as needed for pain due to using PRN.  Patient needs refills for Ondansetron HCL 4 mg- 1 tablet every 6 hours as needed for nausea sent to [facility].  Confirmed delivery date of 12/21/2019, advised patient that pharmacy will contact them the morning of delivery.   Follow-Up:  Coordination of Enhanced Pharmacy Services and Pharmacist Review- Patient is in need of a meter due to loosing her OneTouch meter, [facility] has a Reli-on meter that they can give and she would just need to purchase strips from [place]. Patient aware and very thankful, she will purchase strips and lancets from [facility]. Sherre Poot, CPP notified.  Pattricia Boss, [place] Pharmacist Assistant [phone]

## 2019-12-20 ENCOUNTER — Telehealth: Payer: Self-pay

## 2019-12-20 MED ORDER — ONDANSETRON HCL 4 MG PO TABS: 4.0000 mg | ORAL_TABLET | Freq: Three times a day (TID) | ORAL | 0 refills | Status: DC | PRN

## 2019-12-20 NOTE — Telephone Encounter (Addendum)
E-scribed refill.  ----- Message from Burnice Logan, [facility] sent at 12/20/2019  8:09 AM EDT ----- Regarding: Refill Request Happy Monday! I hope your weekend was a good one.   Ms. Milone is requesting refills of her ondansetron 4 mg (regular tablet) to be delivered through [facility]).   Thanks so much,  Emeterio Reeve

## 2019-12-24 ENCOUNTER — Ambulatory Visit: Payer: Medicare HMO | Admitting: Family Medicine

## 2019-12-28 ENCOUNTER — Ambulatory Visit: Payer: Medicare HMO | Admitting: Cardiology

## 2020-01-07 ENCOUNTER — Telehealth: Payer: Self-pay

## 2020-01-07 NOTE — Chronic Care Management (AMB) (Signed)
Chronic Care Management Pharmacy Assistant    Name: KLEA NALL  MRN: [id number] DOB: [birth year]    Reason for Encounter: Medication Review - Medication Adherence.    PCP : [name], MD  Allergies:   Allergies  Allergen Reactions  . Bee Venom     Throat swelling  . Mushroom Extract Complex Anaphylaxis  . Penicillins Anaphylaxis, Hives and Swelling    Has patient had a PCN reaction causing immediate rash, facial/tongue/throat swelling, SOB or lightheadedness with hypotension: Yes Has patient had a PCN reaction causing severe rash involving mucus membranes or skin necrosis: Yes Has patient had a PCN reaction that required hospitalization Yes Has patient had a PCN reaction occurring within the last 10 years: No If all of the above answers are "NO", then may proceed with Cephalosporin use.   . Codeine Nausea And Vomiting  . Sulfa Antibiotics Nausea And Vomiting  . Iohexol   . Erythromycin Base Rash    Medications: Outpatient Encounter Medications as of 01/07/2020  Medication Sig  . acetaminophen (TYLENOL) 500 MG tablet Take 500 mg by mouth as needed.  Marland Kitchen albuterol (PROAIR HFA) 108 (90 Base) MCG/ACT inhaler Inhale 2 puffs into the lungs every 6 (six) hours as needed for wheezing or shortness of breath.  Marland Kitchen apixaban (ELIQUIS) 5 MG TABS tablet Take 1 tablet (5 mg total) by mouth 2 (two) times daily.  Marland Kitchen atorvastatin (LIPITOR) 40 MG tablet TAKE 1 TABLET BY MOUTH EVERYDAY AT BEDTIME  . Blood Glucose Monitoring Suppl (ONE TOUCH ULTRA 2) w/Device KIT Use as instructed to check blood sugar 2 times daily as needed.  . clopidogrel (PLAVIX) 75 MG tablet Take 1 tablet (75 mg total) by mouth daily.  . Cyanocobalamin (VITAMIN B12) 500 MCG TABS Take 2 tablets by mouth daily.  . dapagliflozin propanediol (FARXIGA) 5 MG TABS tablet Take 1 tablet (5 mg total) by mouth daily before breakfast.  . docusate sodium (COLACE) 100 MG capsule Take 1 capsule (100 mg total) by mouth daily.  (Patient taking differently: Take 100 mg by mouth daily as needed for mild constipation. )  . furosemide (LASIX) 20 MG tablet TAKE 1 TABLET BY MOUTH EVERY DAY  . gabapentin (NEURONTIN) 300 MG capsule Take 1 capsule (300 mg total) by mouth 2 (two) times daily.  Marland Kitchen glipiZIDE (GLUCOTROL) 5 MG tablet TAKE 1 TABLET (5 MG TOTAL) BY MOUTH 2 (TWO) TIMES DAILY BEFORE A MEAL.  Marland Kitchen glucose blood (ONETOUCH ULTRA) test strip Use as instructed to check blood sugar 2 times daily as needed  . HYDROcodone-acetaminophen (NORCO/VICODIN) 5-325 MG tablet Take 1 tablet by mouth every 6 (six) hours as needed (breakthrough pain).  . Insulin Glargine (BASAGLAR KWIKPEN) 100 UNIT/ML Inject 0.1 mLs (10 Units total) into the skin daily.  . isosorbide mononitrate (IMDUR) 30 MG 24 hr tablet Take 0.5 tablets (15 mg total) by mouth daily.  Marland Kitchen latanoprost (XALATAN) 0.005 % ophthalmic solution Place 1 drop into both eyes at bedtime.   . metFORMIN (GLUCOPHAGE) 500 MG tablet Take 1 tablet (500 mg total) by mouth 3 (three) times daily before meals.  . metoCLOPramide (REGLAN) 10 MG tablet Take 1 tablet (10 mg total) by mouth every 6 (six) hours as needed for nausea or vomiting.  . metoprolol tartrate (LOPRESSOR) 25 MG tablet TAKE 1/2 TABLET BY MOUTH DAILY  . nitrofurantoin, macrocrystal-monohydrate, (MACROBID) 100 MG capsule Take 1 capsule (100 mg total) by mouth 2 (two) times daily.  . NON FORMULARY Antifungal cream from [place]  as needed  . ondansetron (ZOFRAN) 4 MG tablet Take 1 tablet (4 mg total) by mouth every 8 (eight) hours as needed for nausea or vomiting.  . ondansetron (ZOFRAN-ODT) 4 MG disintegrating tablet Take 4 mg by mouth every 6 (six) hours as needed for nausea or vomiting.  Glory Rosebush Delica Lancets 11E MISC Use as directed to check blood sugar 2 times daily as needed  . pantoprazole (PROTONIX) 40 MG tablet Take 1 tablet (40 mg total) by mouth 2 (two) times daily.  . traZODone (DESYREL) 50 MG tablet Take 1.5  tablets (75 mg total) by mouth at bedtime as needed for sleep.   No facility-administered encounter medications on file as of 01/07/2020.    Current Diagnosis: Patient Active Problem List   Diagnosis Date Noted  . Dysuria 11/08/2019  . Right sided temporal headache 08/06/2019  . Atrial flutter with rapid ventricular response ([place])   . Acute deep vein thrombosis (DVT) of left tibial vein ([place]) 07/11/2019  . Acute cholecystitis 07/07/2019  . Pain of right breast 03/15/2019  . Health maintenance examination 12/11/2018  . Type 2 diabetes mellitus with severe nonproliferative diabetic retinopathy with macular edema, bilateral ([place]) 07/16/2018  . Coronary artery disease of native artery of native heart with stable angina pectoris ([place])   . Effort angina ([place]) 11/12/2017  . Abnormal stress ECG   . Dyspnea on exertion 11/04/2017  . GERD (gastroesophageal reflux disease) 08/15/2017  . Trigger finger 06/09/2017  . Nocturnal leg movements 03/25/2017  . Osteopenia 02/01/2017  . Advanced care planning/counseling discussion 01/02/2017  . Stage 3 chronic kidney disease due to type 2 diabetes mellitus ([place]) 08/26/2016  . Insomnia 08/26/2016  . Vitamin B12 deficiency 11/10/2015  . General weakness 08/04/2015  . Epistaxis 07/07/2015  . Fatty liver disease, nonalcoholic 16/24/4695  . Chronic diastolic CHF (congestive heart failure) ([place]) 08/27/2014  . Positive hepatitis C antibody test 08/27/2014  . Iron deficiency 08/27/2014  . NSVT (nonsustained ventricular tachycardia) ([place])   . Nausea without vomiting   . Autonomic orthostatic hypotension 07/11/2014  . S/P CABG x 3 07/08/2014  . History of cerebrovascular accident (CVA) in adulthood 07/08/2014  . DM (diabetes mellitus), type 2, uncontrolled w/neurologic complication ([place]) 09/29/5748  . Itching 03/16/2012  . Rash 03/16/2012  . Medicare annual wellness visit, initial 10/28/2011  . Hyperlipidemia associated with type 2 diabetes mellitus  ([place])   . Idiopathic angioedema 06/23/2011  . Vitiligo 06/23/2011  . Dermatitis 06/23/2011  . MDD (major depressive disorder), recurrent severe, without psychosis ([place]) 05/08/2006  . Hypertension, essential 05/08/2006  . MENOPAUSAL SYNDROME 05/08/2006    Follow-Up:  Pharmacist Review - Reviewed chart and adherence measures . Per Insurance data Atorvastatin is 100% PDC YTD.  Elly Modena Owens Shark, [place] Notified  Judithann Sheen, [facility] Clinical Pharmacist Assistant [phone]

## 2020-01-11 ENCOUNTER — Telehealth: Payer: Self-pay

## 2020-01-11 DIAGNOSIS — E785 Hyperlipidemia, unspecified: Secondary | ICD-10-CM

## 2020-01-11 MED ORDER — ATORVASTATIN CALCIUM 40 MG PO TABS: ORAL_TABLET | ORAL | 0 refills | Status: DC

## 2020-01-11 NOTE — Telephone Encounter (Signed)
E-scribed refill.  Plz schedule wellness, cpe and lab visits.  

## 2020-01-12 ENCOUNTER — Telehealth: Payer: Self-pay

## 2020-01-12 NOTE — Telephone Encounter (Signed)
Pt is scheduled for a follow up this month. I have added into the notes for [place] to schedule her awv when she comes in for that appt.

## 2020-01-12 NOTE — Progress Notes (Addendum)
Chronic Care Management Pharmacy Assistant   Name: Maria Fox  MRN: [id number] DOB: December 09, 1945  Reason for Encounter: Medication Review  Patient Questions:  1.  Have you seen any other providers since your last visit? No  2.  Any changes in your medicines or health? No   Maria Fox,  74 y.o. , female presents for their Follow-Up CCM visit with the clinical pharmacist via telephone.  PCP : [name], MD  Allergies:   Allergies  Allergen Reactions   Bee Venom     Throat swelling   Mushroom Extract Complex Anaphylaxis   Penicillins Anaphylaxis, Hives and Swelling    Has patient had a PCN reaction causing immediate rash, facial/tongue/throat swelling, SOB or lightheadedness with hypotension: Yes Has patient had a PCN reaction causing severe rash involving mucus membranes or skin necrosis: Yes Has patient had a PCN reaction that required hospitalization Yes Has patient had a PCN reaction occurring within the last 10 years: No If all of the above answers are "NO", then may proceed with Cephalosporin use.    Codeine Nausea And Vomiting   Sulfa Antibiotics Nausea And Vomiting   Iohexol    Erythromycin Base Rash    Medications: Outpatient Encounter Medications as of 01/12/2020  Medication Sig   acetaminophen (TYLENOL) 500 MG tablet Take 500 mg by mouth as needed.   albuterol (PROAIR HFA) 108 (90 Base) MCG/ACT inhaler Inhale 2 puffs into the lungs every 6 (six) hours as needed for wheezing or shortness of breath.   apixaban (ELIQUIS) 5 MG TABS tablet Take 1 tablet (5 mg total) by mouth 2 (two) times daily.   atorvastatin (LIPITOR) 40 MG tablet TAKE 1 TABLET BY MOUTH EVERYDAY AT BEDTIME   Blood Glucose Monitoring Suppl (ONE TOUCH ULTRA 2) w/Device KIT Use as instructed to check blood sugar 2 times daily as needed.   clopidogrel (PLAVIX) 75 MG tablet Take 1 tablet (75 mg total) by mouth daily.   Cyanocobalamin (VITAMIN B12) 500 MCG TABS Take 2 tablets by mouth daily.    dapagliflozin propanediol (FARXIGA) 5 MG TABS tablet Take 1 tablet (5 mg total) by mouth daily before breakfast.   docusate sodium (COLACE) 100 MG capsule Take 1 capsule (100 mg total) by mouth daily. (Patient taking differently: Take 100 mg by mouth daily as needed for mild constipation. )   furosemide (LASIX) 20 MG tablet TAKE 1 TABLET BY MOUTH EVERY DAY   gabapentin (NEURONTIN) 300 MG capsule Take 1 capsule (300 mg total) by mouth 2 (two) times daily.   glipiZIDE (GLUCOTROL) 5 MG tablet TAKE 1 TABLET (5 MG TOTAL) BY MOUTH 2 (TWO) TIMES DAILY BEFORE A MEAL.   glucose blood (ONETOUCH ULTRA) test strip Use as instructed to check blood sugar 2 times daily as needed   HYDROcodone-acetaminophen (NORCO/VICODIN) 5-325 MG tablet Take 1 tablet by mouth every 6 (six) hours as needed (breakthrough pain).   Insulin Glargine (BASAGLAR KWIKPEN) 100 UNIT/ML Inject 0.1 mLs (10 Units total) into the skin daily.   isosorbide mononitrate (IMDUR) 30 MG 24 hr tablet Take 0.5 tablets (15 mg total) by mouth daily.   latanoprost (XALATAN) 0.005 % ophthalmic solution Place 1 drop into both eyes at bedtime.    metFORMIN (GLUCOPHAGE) 500 MG tablet Take 1 tablet (500 mg total) by mouth 3 (three) times daily before meals.   metoCLOPramide (REGLAN) 10 MG tablet Take 1 tablet (10 mg total) by mouth every 6 (six) hours as needed for nausea or vomiting.  metoprolol tartrate (LOPRESSOR) 25 MG tablet TAKE 1/2 TABLET BY MOUTH DAILY   nitrofurantoin, macrocrystal-monohydrate, (MACROBID) 100 MG capsule Take 1 capsule (100 mg total) by mouth 2 (two) times daily.   NON FORMULARY Antifungal cream from [place] as needed   ondansetron (ZOFRAN) 4 MG tablet Take 1 tablet (4 mg total) by mouth every 8 (eight) hours as needed for nausea or vomiting.   ondansetron (ZOFRAN-ODT) 4 MG disintegrating tablet Take 4 mg by mouth every 6 (six) hours as needed for nausea or vomiting.   OneTouch Delica Lancets 59R MISC Use as directed to  check blood sugar 2 times daily as needed   pantoprazole (PROTONIX) 40 MG tablet Take 1 tablet (40 mg total) by mouth 2 (two) times daily.   traZODone (DESYREL) 50 MG tablet Take 1.5 tablets (75 mg total) by mouth at bedtime as needed for sleep.   No facility-administered encounter medications on file as of 01/12/2020.    Current Diagnosis: Patient Active Problem List   Diagnosis Date Noted   Dysuria 11/08/2019   Right sided temporal headache 08/06/2019   Atrial flutter with rapid ventricular response ([name])    Acute deep vein thrombosis (DVT) of left tibial vein ([place]) 07/11/2019   Acute cholecystitis 07/07/2019   Pain of right breast 03/15/2019   Health maintenance examination 12/11/2018   Type 2 diabetes mellitus with severe nonproliferative diabetic retinopathy with macular edema, bilateral ([place]) 07/16/2018   Coronary artery disease of native artery of native heart with stable angina pectoris ([name])    Effort angina ([place]) 11/12/2017   Abnormal stress ECG    Dyspnea on exertion 11/04/2017   GERD (gastroesophageal reflux disease) 08/15/2017   Trigger finger 06/09/2017   Nocturnal leg movements 03/25/2017   Osteopenia 02/01/2017   Advanced care planning/counseling discussion 01/02/2017   Stage 3 chronic kidney disease due to type 2 diabetes mellitus ([place]) 08/26/2016   Insomnia 08/26/2016   Vitamin B12 deficiency 11/10/2015   General weakness 08/04/2015   Epistaxis 07/07/2015   Fatty liver disease, nonalcoholic 41/63/8453   Chronic diastolic CHF (congestive heart failure) ([place]) 08/27/2014   Positive hepatitis C antibody test 08/27/2014   Iron deficiency 08/27/2014   NSVT (nonsustained ventricular tachycardia) ([name])    Nausea without vomiting    Autonomic orthostatic hypotension 07/11/2014   S/P CABG x 3 07/08/2014   History of cerebrovascular accident (CVA) in adulthood 07/08/2014   DM (diabetes mellitus), type 2, uncontrolled w/neurologic complication ([place]) 64/68/0321   Itching  03/16/2012   Rash 03/16/2012   Medicare annual wellness visit, initial 10/28/2011   Hyperlipidemia associated with type 2 diabetes mellitus ([place])    Idiopathic angioedema 06/23/2011   Vitiligo 06/23/2011   Dermatitis 06/23/2011   MDD (major depressive disorder), recurrent severe, without psychosis ([place]) 05/08/2006   Hypertension, essential 05/08/2006   MENOPAUSAL SYNDROME 05/08/2006    Goals Addressed   None     Follow-Up:  Coordination of Enhanced Pharmacy Services   Reviewed chart for medication changes ahead of medication coordination call. No medication changes indicated   BP Readings from Last 3 Encounters:  11/08/19 122/70  08/03/19 (!) 144/84  07/23/19 118/65    Lab Results  Component Value Date   HGBA1C 11.2 (A) 11/08/2019     Patient obtains medications through Vials  90 Days   Last adherence delivery included: Atorvastatin 40 mg - 1 tablet daily (bedtime), Clopidogrel 75 mg - 1 tablet daily (breakfast), Furosemide 20 mg- 1 tablet daily (breakfast), Gabapentin 100 mg- 1 table twice  daily and 3 tablet nightly (breakfast, lunch, bedtime), Glipizide 5 mg - 1 table twice daily ( breakfast, evening meal), Isosorbide Mononitrate 30 mg - 0.5 tablet daily (breakfast), Metformin 500 mg - 1 tablet 3 times a day (breakfast, lunch, evening meal ), Latanoprost 0.005 % eye drops- 1 drop each eye daily, Metoprolol tartrate 25 mg 0.5 tablet daily (breakfast), Pantoprazole 20 mg - 1 tablet daily (breakfast), Trazodone 50 mg- 1.5 tablet nightly (bedtime), Farxiga 5 mg - 1 tablet daily (before breakfast).   Patient did not decline any medications last month due to PRN use/additional supply on hand.  Patient is due for next adherence delivery on: 01/20/2020.  Called patient and reviewed medications and coordinated delivery.  This delivery to include: Vials, 90 DS Atorvastatin 40 mg - 1 tablet daily (bedtime) Clopidogrel 75 mg - 1 tablet daily (breakfast) Furosemide 20 mg- 1 tablet  daily (breakfast) Gabapentin 100 mg- 1 table twice daily and 3 tablet nightly (breakfast, lunch, bedtime) Glipizide 5 mg - 1 table twice daily (breakfast, evening meal) Isosorbide Mononitrate 30 mg - 0.5 tablet daily (breakfast) Latanoprost 0.005 % eye drops- 1 drop each eye daily Metoprolol tartrate 25 mg 0.5 tablet daily (breakfast) Pantoprazole 20 mg - 1 tablet daily (breakfast) Farxiga 5 mg - 1 tablet daily (before breakfast).  Ondansetron HCL 4 mg- 1 tablet every 6 hours as needed for nausea Albuterol 108(90 base) mcg/ ACT- Inhale 2 puffs into the lungs every 6 (six) hours as needed for wheezing or shortness of breath (Rx needed)  Patient needs refills for Albuterol  Confirmed delivery date of 01/20/2020, advised patient that pharmacy will contact them the morning of delivery.  [facility], CMA  I have reviewed the care management and care coordination activities outlined in this encounter and I am certifying that I agree with the content of this note. No further action required.  [name], PharmD Clinical Pharmacist [facility] at [facility] [phone]

## 2020-01-12 NOTE — Telephone Encounter (Signed)
Noted  

## 2020-01-17 ENCOUNTER — Other Ambulatory Visit: Payer: Self-pay

## 2020-01-17 MED ORDER — BASAGLAR KWIKPEN 100 UNIT/ML ~~LOC~~ SOPN: 10.0000 [IU] | PEN_INJECTOR | Freq: Every day | SUBCUTANEOUS | 3 refills | Status: DC

## 2020-01-17 MED ORDER — METOCLOPRAMIDE HCL 10 MG PO TABS: 10.0000 mg | ORAL_TABLET | Freq: Four times a day (QID) | ORAL | 0 refills | Status: DC | PRN

## 2020-01-17 MED ORDER — FUROSEMIDE 20 MG PO TABS: 20.0000 mg | ORAL_TABLET | Freq: Every day | ORAL | 0 refills | Status: DC

## 2020-01-17 MED ORDER — GLIPIZIDE 5 MG PO TABS: ORAL_TABLET | ORAL | 0 refills | Status: DC

## 2020-01-17 MED ORDER — DAPAGLIFLOZIN PROPANEDIOL 5 MG PO TABS: 5.0000 mg | ORAL_TABLET | Freq: Every day | ORAL | 0 refills | Status: DC

## 2020-01-17 NOTE — Telephone Encounter (Signed)
-----   Message from [place], [facility] sent at 01/14/2020  4:04 PM EDT ----- Regarding: Refills Needed Refills are being requested to [facility] for the following medications:ondansetron, Basaglar, Farxiga, metoprolol tartrate, glipizide, furosemide, clopidogrel  Thanks,  [name], PharmD Clinical Pharmacist [facility] at [facility] [phone]

## 2020-01-17 NOTE — Telephone Encounter (Signed)
-----   Message from [name], [facility] sent at 01/14/2020  3:12 PM EDT ----- Regarding: Albuterol Refill Patient called requesting refill on albuterol inhaler. Last filled by pulmonology 2019. No longer established with pulmonology. Would like to know if PCP is willing to prescribe.   Thanks!   Sharyn Lull

## 2020-01-17 NOTE — Telephone Encounter (Signed)
Plavix Last rx:  07/23/19, #90 Last OV:  11/08/19, 3 mo f/u Next OV:  01/31/20, 1 mo DM f/u  E-scribed other refills

## 2020-01-18 MED ORDER — ALBUTEROL SULFATE HFA 108 (90 BASE) MCG/ACT IN AERS: 2.0000 | INHALATION_SPRAY | Freq: Four times a day (QID) | RESPIRATORY_TRACT | 3 refills | Status: DC | PRN

## 2020-01-18 MED ORDER — APIXABAN 5 MG PO TABS: 5.0000 mg | ORAL_TABLET | Freq: Two times a day (BID) | ORAL | 0 refills | Status: DC

## 2020-01-18 NOTE — Telephone Encounter (Signed)
ERx 

## 2020-01-19 ENCOUNTER — Other Ambulatory Visit: Payer: Self-pay | Admitting: Cardiology

## 2020-01-20 ENCOUNTER — Telehealth: Payer: Self-pay

## 2020-01-20 MED ORDER — METOPROLOL TARTRATE 25 MG PO TABS: ORAL_TABLET | ORAL | 0 refills | Status: DC

## 2020-01-20 NOTE — Telephone Encounter (Signed)
E-scribed refill.  Plz schedule wellness, cpe and lab visits.  

## 2020-01-28 ENCOUNTER — Telehealth: Payer: Self-pay

## 2020-01-28 ENCOUNTER — Encounter: Payer: Self-pay | Admitting: Cardiology

## 2020-01-28 ENCOUNTER — Other Ambulatory Visit: Payer: Self-pay

## 2020-01-28 ENCOUNTER — Ambulatory Visit (INDEPENDENT_AMBULATORY_CARE_PROVIDER_SITE_OTHER): Payer: Medicare HMO | Admitting: Cardiology

## 2020-01-28 VITALS — BP 104/74 | HR 77 | Ht 62.0 in | Wt 144.0 lb

## 2020-01-28 DIAGNOSIS — I951 Orthostatic hypotension: Secondary | ICD-10-CM

## 2020-01-28 DIAGNOSIS — E7800 Pure hypercholesterolemia, unspecified: Secondary | ICD-10-CM

## 2020-01-28 DIAGNOSIS — I10 Essential (primary) hypertension: Secondary | ICD-10-CM | POA: Diagnosis not present

## 2020-01-28 DIAGNOSIS — I4729 Other ventricular tachycardia: Secondary | ICD-10-CM

## 2020-01-28 DIAGNOSIS — R0600 Dyspnea, unspecified: Secondary | ICD-10-CM

## 2020-01-28 DIAGNOSIS — I25118 Atherosclerotic heart disease of native coronary artery with other forms of angina pectoris: Secondary | ICD-10-CM

## 2020-01-28 DIAGNOSIS — R0609 Other forms of dyspnea: Secondary | ICD-10-CM

## 2020-01-28 DIAGNOSIS — I472 Ventricular tachycardia: Secondary | ICD-10-CM

## 2020-01-28 MED ORDER — METOPROLOL SUCCINATE ER 25 MG PO TB24
12.5000 mg | ORAL_TABLET | Freq: Every day | ORAL | 3 refills | Status: DC
Start: 2020-01-28 — End: 2020-02-07

## 2020-01-28 NOTE — Telephone Encounter (Signed)
-----   Message from Sueanne Margarita, MD sent at 01/28/2020  4:51 PM EST ----- Change Lopressor to Toprol XL 12.5mg  daily and check BP and HR daily for a week and call with results ----- Message ----- From: Antonieta Iba, RN Sent: 01/28/2020   3:32 PM EST To: Sueanne Margarita, MD  She is taking Lopressor (metoprolol tartrate) 12.5 mg daily
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
## 2020-01-28 NOTE — Telephone Encounter (Signed)
Spoke with the patient and she will stop taking Lopressor. She will start on Toprol XL 12.5 mg dialy. Rx has been sent in. She will take her HR and BP daily for the next week and call us with her readings.

## 2020-01-28 NOTE — Addendum Note (Signed)
Addended by: Antonieta Iba on: 01/28/2020 03:27 PM   Modules accepted: Orders

## 2020-01-28 NOTE — Patient Instructions (Signed)
Medication Instructions:  Your physician recommends that you continue on your current medications as directed. Please refer to the Current Medication list given to you today.  *If you need a refill on your cardiac medications before your next appointment, please call your pharmacy*   Lab Work: TODAY: BMET, CBC If you have labs (blood work) drawn today and your tests are completely normal, you will receive your results only by: Marland Kitchen MyChart Message (if you have MyChart) OR . A paper copy in the mail If you have any lab test that is abnormal or we need to change your treatment, we will call you to review the results.  Follow-Up: At [facility], you and your health needs are our priority.  As part of our continuing mission to provide you with exceptional heart care, we have created designated Provider Care Teams.  These Care Teams include your primary Cardiologist (physician) and Advanced Practice Providers (APPs -  Physician Assistants and Nurse Practitioners) who all work together to provide you with the care you need, when you need it.  We recommend signing up for the patient portal called "MyChart".  Sign up information is provided on this After Visit Summary.  MyChart is used to connect with patients for Virtual Visits (Telemedicine).  Patients are able to view lab/test results, encounter notes, upcoming appointments, etc.  Non-urgent messages can be sent to your provider as well.   To learn more about what you can do with MyChart, go to [email or web address].    Your next appointment:   6 month(s)  The format for your next appointment:   In Person  Provider:   You may see Fransico Him, MD or one of the following Advanced Practice Providers on your designated Care Team:    Melina Copa, PA-C  Ermalinda Barrios, PA-C

## 2020-01-29 LAB — CBC
Hematocrit: 40 % (ref 34.0–46.6)
Hemoglobin: 13.5 g/dL (ref 11.1–15.9)
MCH: 29.8 pg (ref 26.6–33.0)
MCHC: 33.8 g/dL (ref 31.5–35.7)
MCV: 88 fL (ref 79–97)
Platelets: 226 10*3/uL (ref 150–450)
RBC: 4.53 x10E6/uL (ref 3.77–5.28)
RDW: 13.3 % (ref 11.7–15.4)
WBC: 7.6 10*3/uL (ref 3.4–10.8)

## 2020-01-29 LAB — BASIC METABOLIC PANEL
BUN/Creatinine Ratio: 9 — ABNORMAL LOW (ref 12–28)
BUN: 13 mg/dL (ref 8–27)
CO2: 24 mmol/L (ref 20–29)
Calcium: 9 mg/dL (ref 8.7–10.3)
Chloride: 100 mmol/L (ref 96–106)
Creatinine, Ser: 1.43 mg/dL — ABNORMAL HIGH (ref 0.57–1.00)
GFR calc Af Amer: 41 mL/min/{1.73_m2} — ABNORMAL LOW (ref >59)
GFR calc non Af Amer: 36 mL/min/{1.73_m2} — ABNORMAL LOW (ref >59)
Glucose: 292 mg/dL — ABNORMAL HIGH (ref 65–99)
Potassium: 3.7 mmol/L (ref 3.5–5.2)
Sodium: 137 mmol/L (ref 134–144)

## 2020-01-31 ENCOUNTER — Ambulatory Visit: Payer: Medicare HMO | Admitting: Family Medicine

## 2020-02-02 ENCOUNTER — Telehealth: Payer: Self-pay

## 2020-02-02 NOTE — Progress Notes (Addendum)
Opened in ERROR

## 2020-02-02 NOTE — Chronic Care Management (AMB) (Signed)
Chronic Care Management Pharmacy Assistant   Name: Maria Fox  MRN: [id number] DOB: [birth year]  Reason for Encounter: Medication Review  PCP : [name], MD   02/02/2020- Called patient to inquire about diabetes supplies. Spoke with patient and son Maria Fox, she has not been able to get test strips and lancets yet but son Maria Fox will pick up in a few days. Informed that per [name], PharmD her testing supplies from [facility] are not covered by insurance and its around $9 for 50 test strips/lancets. Patient aware and ok with getting strips and lancets from [facility]. While on the phone patient had questions regarding a few of her medications and had a mediation request. Patient is wondering if she should be on Pantoprazole 20 mg or 40 mg? She usually takes 40 mg 1 tablet twice a day but recent prescription sent was for 20 mg 1 tablet daily. Patient also had a question about her Metoprolol prescriptions, she states she has 2 bottles, Metoprolol Tartrate 25 mg and Metoprolol Succinate 25 mg. Patient also mentioned that she had 9 days left on her Isosorbide 30 mg prescription and running out of her other mediations in about 20 days. She had 20 pills left of Plavix 75 mg, and about 20 pills left on her [name], she states she did not get these with last delivery. Patient aware that I will send pharmacy a request to have Isosorbide sent out next week. Patient also aware I will look into her medications that are running out. Reviewed chart while on the phone with patient informed patient that per last office visit with Dr Radford Pax- Cardiologist, Metoprolol Tartrate 25 mg was discontinued and she is to take Metoprolol Succinate 25 mg 1/2 tablet daily (15 mg). Patient aware and [name] was notified.  02/07/2020- Coordinated acute fill form for Isosorbide 30 mg to be delivered on 02/09/2020 prior to next adherence delivery. [name] notified on acute fill and question regarding Pantoprazole.  She confirmed that patient should be taking Pantoprazole 40 mg bid, prescription last sent for 20 mg was in error and she advises patient double up on Pantoprazole taking 2 tablets twice a day until we get her new prescription of correct dosage. Patient aware and states she will do that, she is not out of any medications. Patient also aware I will follow up with her on Friday to review medications for adherence delivery.   Allergies:   Allergies  Allergen Reactions  . Bee Venom     Throat swelling  . Mushroom Extract Complex Anaphylaxis  . Penicillins Anaphylaxis, Hives and Swelling    Has patient had a PCN reaction causing immediate rash, facial/tongue/throat swelling, SOB or lightheadedness with hypotension: Yes Has patient had a PCN reaction causing severe rash involving mucus membranes or skin necrosis: Yes Has patient had a PCN reaction that required hospitalization Yes Has patient had a PCN reaction occurring within the last 10 years: No If all of the above answers are "NO", then may proceed with Cephalosporin use.   . Codeine Nausea And Vomiting  . Sulfa Antibiotics Nausea And Vomiting  . Iohexol   . Erythromycin Base Rash    Medications: Outpatient Encounter Medications as of 02/02/2020  Medication Sig  . acetaminophen (TYLENOL) 500 MG tablet Take 500 mg by mouth as needed.  Marland Kitchen albuterol (PROAIR HFA) 108 (90 Base) MCG/ACT inhaler Inhale 2 puffs into the lungs every 6 (six) hours as needed for wheezing or shortness of breath.  Marland Kitchen apixaban (  ELIQUIS) 5 MG TABS tablet Take 1 tablet (5 mg total) by mouth 2 (two) times daily.  Marland Kitchen atorvastatin (LIPITOR) 40 MG tablet TAKE 1 TABLET BY MOUTH EVERYDAY AT BEDTIME  . Blood Glucose Monitoring Suppl (ONE TOUCH ULTRA 2) w/Device KIT Use as instructed to check blood sugar 2 times daily as needed.  . clopidogrel (PLAVIX) 75 MG tablet Take 1 tablet (75 mg total) by mouth daily.  . Cyanocobalamin (VITAMIN B12) 500 MCG TABS Take 2 tablets by mouth  daily.  . dapagliflozin propanediol (FARXIGA) 5 MG TABS tablet Take 1 tablet (5 mg total) by mouth daily before breakfast.  . docusate sodium (COLACE) 100 MG capsule Take 1 capsule (100 mg total) by mouth daily.  . furosemide (LASIX) 20 MG tablet Take 1 tablet (20 mg total) by mouth daily.  Marland Kitchen gabapentin (NEURONTIN) 300 MG capsule Take 1 capsule (300 mg total) by mouth 2 (two) times daily.  Marland Kitchen glipiZIDE (GLUCOTROL) 5 MG tablet TAKE 1 TABLET (5 MG TOTAL) BY MOUTH 2 (TWO) TIMES DAILY BEFORE A MEAL.  Marland Kitchen glucose blood (ONETOUCH ULTRA) test strip Use as instructed to check blood sugar 2 times daily as needed  . HYDROcodone-acetaminophen (NORCO/VICODIN) 5-325 MG tablet Take 1 tablet by mouth every 6 (six) hours as needed (breakthrough pain).  . Insulin Glargine (BASAGLAR KWIKPEN) 100 UNIT/ML Inject 10 Units into the skin daily.  . isosorbide mononitrate (IMDUR) 30 MG 24 hr tablet Take 0.5 tablets (15 mg total) by mouth daily. Please keep upcoming appt in November with Dr. Radford Pax for future refills. Thank you  . latanoprost (XALATAN) 0.005 % ophthalmic solution Place 1 drop into both eyes at bedtime.   . metFORMIN (GLUCOPHAGE) 500 MG tablet Take 1 tablet (500 mg total) by mouth 3 (three) times daily before meals.  . metoCLOPramide (REGLAN) 10 MG tablet Take 1 tablet (10 mg total) by mouth every 6 (six) hours as needed for nausea or vomiting.  . metoprolol succinate (TOPROL XL) 25 MG 24 hr tablet Take 0.5 tablets (12.5 mg total) by mouth daily.  . nitrofurantoin, macrocrystal-monohydrate, (MACROBID) 100 MG capsule Take 1 capsule (100 mg total) by mouth 2 (two) times daily.  . NON FORMULARY Antifungal cream from [place] as needed  . ondansetron (ZOFRAN) 4 MG tablet Take 1 tablet (4 mg total) by mouth every 8 (eight) hours as needed for nausea or vomiting.  . ondansetron (ZOFRAN-ODT) 4 MG disintegrating tablet Take 4 mg by mouth every 6 (six) hours as needed for nausea or vomiting.  Glory Rosebush  Delica Lancets 09Q MISC Use as directed to check blood sugar 2 times daily as needed  . pantoprazole (PROTONIX) 40 MG tablet Take 1 tablet (40 mg total) by mouth 2 (two) times daily.  . traZODone (DESYREL) 50 MG tablet Take 1.5 tablets (75 mg total) by mouth at bedtime as needed for sleep.   No facility-administered encounter medications on file as of 02/02/2020.    Current Diagnosis: Patient Active Problem List   Diagnosis Date Noted  . Dysuria 11/08/2019  . Right sided temporal headache 08/06/2019  . Atrial flutter with rapid ventricular response ([place])   . Acute deep vein thrombosis (DVT) of left tibial vein ([place]) 07/11/2019  . Acute cholecystitis 07/07/2019  . Pain of right breast 03/15/2019  . Health maintenance examination 12/11/2018  . Type 2 diabetes mellitus with severe nonproliferative diabetic retinopathy with macular edema, bilateral ([place]) 07/16/2018  . Coronary artery disease of native artery of native heart with stable angina pectoris (  [place])   . Effort angina ([place]) 11/12/2017  . Abnormal stress ECG   . Dyspnea on exertion 11/04/2017  . GERD (gastroesophageal reflux disease) 08/15/2017  . Trigger finger 06/09/2017  . Nocturnal leg movements 03/25/2017  . Osteopenia 02/01/2017  . Advanced care planning/counseling discussion 01/02/2017  . Stage 3 chronic kidney disease due to type 2 diabetes mellitus ([place]) 08/26/2016  . Insomnia 08/26/2016  . Vitamin B12 deficiency 11/10/2015  . General weakness 08/04/2015  . Epistaxis 07/07/2015  . Fatty liver disease, nonalcoholic 09/92/7800  . Chronic diastolic CHF (congestive heart failure) ([place]) 08/27/2014  . Positive hepatitis C antibody test 08/27/2014  . Iron deficiency 08/27/2014  . NSVT (nonsustained ventricular tachycardia) ([place])   . Nausea without vomiting   . Autonomic orthostatic hypotension 07/11/2014  . S/P CABG x 3 07/08/2014  . History of cerebrovascular accident (CVA) in adulthood 07/08/2014  . DM (diabetes  mellitus), type 2, uncontrolled w/neurologic complication ([place]) 44/71/5806  . Itching 03/16/2012  . Rash 03/16/2012  . Medicare annual wellness visit, initial 10/28/2011  . Hyperlipidemia associated with type 2 diabetes mellitus ([place])   . Idiopathic angioedema 06/23/2011  . Vitiligo 06/23/2011  . Dermatitis 06/23/2011  . MDD (major depressive disorder), recurrent severe, without psychosis ([place]) 05/08/2006  . Hypertension, essential 05/08/2006  . MENOPAUSAL SYNDROME 05/08/2006     Follow-Up:  Pharmacist Review   [name], CPP notified.  Pattricia Boss, [place] Pharmacist Assistant [phone]

## 2020-02-07 ENCOUNTER — Telehealth: Payer: Self-pay | Admitting: Cardiology

## 2020-02-07 DIAGNOSIS — E785 Hyperlipidemia, unspecified: Secondary | ICD-10-CM

## 2020-02-07 MED ORDER — ISOSORBIDE MONONITRATE ER 30 MG PO TB24: 15.0000 mg | ORAL_TABLET | Freq: Every day | ORAL | 3 refills | Status: DC

## 2020-02-07 MED ORDER — METOPROLOL SUCCINATE ER 25 MG PO TB24: 12.5000 mg | ORAL_TABLET | Freq: Every day | ORAL | 3 refills | Status: DC

## 2020-02-07 MED ORDER — CLOPIDOGREL BISULFATE 75 MG PO TABS: 75.0000 mg | ORAL_TABLET | Freq: Every day | ORAL | 3 refills | Status: DC

## 2020-02-07 NOTE — Telephone Encounter (Signed)
*  STAT* If patient is at the pharmacy, call can be transferred to refill team.   1. Which medications need to be refilled? (please list name of each medication and dose if known)  metoprolol succinate (TOPROL XL) 25 MG 24 hr tablet isosorbide mononitrate (IMDUR) 30 MG 24 hr tablet clopidogrel (PLAVIX) 75 MG tablet 2. Which pharmacy/location (including street and city if local pharmacy) is medication to be sent to? [facility] - [place], [place] - [place] [address]  3. Do they need a 30 day or 90 day supply? 90 day supply

## 2020-02-07 NOTE — Telephone Encounter (Signed)
Pt's medications were sent to pt's pharmacy as requested. Confirmation received.  

## 2020-02-09 ENCOUNTER — Telehealth: Payer: Self-pay | Admitting: Family Medicine

## 2020-02-09 DIAGNOSIS — E1149 Type 2 diabetes mellitus with other diabetic neurological complication: Secondary | ICD-10-CM

## 2020-02-09 DIAGNOSIS — IMO0002 Reserved for concepts with insufficient information to code with codable children: Secondary | ICD-10-CM

## 2020-02-09 NOTE — Telephone Encounter (Signed)
Spoke with pt asking about the message.  States ins co does not Scientist, physiological but does cover Lantus.  Pt requests new rx be sent to [facility].

## 2020-02-09 NOTE — Telephone Encounter (Signed)
Patient called in and stated she would like to speak with Lattie Haw and Dr.G in regards to changing script of insulin. Stated her insurance will not cover what was called in but will cover Lantus. Please advise.

## 2020-02-10 MED ORDER — LANTUS SOLOSTAR 100 UNIT/ML ~~LOC~~ SOPN: 10.0000 [IU] | PEN_INJECTOR | Freq: Every day | SUBCUTANEOUS | 1 refills | Status: DC

## 2020-02-10 NOTE — Addendum Note (Signed)
Addended by: [name] on: 02/10/2020 09:41 AM   Modules accepted: Orders

## 2020-02-10 NOTE — Telephone Encounter (Addendum)
Lantus sent in for patient to [facility].  plz verify solostar pen is ok, and she doesn't need lantus vials.

## 2020-02-10 NOTE — Telephone Encounter (Signed)
Lvm asking pt to call back. Need to relay Dr. G's message and get answer to his question.  

## 2020-02-11 NOTE — Telephone Encounter (Signed)
Lvm asking pt to call back. Need to relay Dr. G's message and get answer to his question.  

## 2020-02-14 ENCOUNTER — Telehealth: Payer: Self-pay

## 2020-02-14 NOTE — Telephone Encounter (Signed)
Lvm asking pt to call back. Need to relay Dr. G's message and get answer to his question.  

## 2020-02-14 NOTE — Chronic Care Management (AMB) (Signed)
Chronic Care Management Pharmacy Assistant   Name: Maria Fox  MRN: [id number] DOB: [birth year]  Reason for Encounter: Medication Review  Patient Questions:  1.  Have you seen any other providers since your last visit? Yes- 01/28/2020- Dr Radford Pax (Cardiology)  2.  Any changes in your medicines or health? Yes, Metoprolol Tartrate discontinued and Metoprolol Succinate started 01/28/2020.     PCP : [name], MD  Allergies:   Allergies  Allergen Reactions  . Bee Venom     Throat swelling  . Mushroom Extract Complex Anaphylaxis  . Penicillins Anaphylaxis, Hives and Swelling    Has patient had a PCN reaction causing immediate rash, facial/tongue/throat swelling, SOB or lightheadedness with hypotension: Yes Has patient had a PCN reaction causing severe rash involving mucus membranes or skin necrosis: Yes Has patient had a PCN reaction that required hospitalization Yes Has patient had a PCN reaction occurring within the last 10 years: No If all of the above answers are "NO", then may proceed with Cephalosporin use.   . Codeine Nausea And Vomiting  . Sulfa Antibiotics Nausea And Vomiting  . Iohexol   . Erythromycin Base Rash    Medications: Outpatient Encounter Medications as of 02/14/2020  Medication Sig  . acetaminophen (TYLENOL) 500 MG tablet Take 500 mg by mouth as needed.  Marland Kitchen albuterol (PROAIR HFA) 108 (90 Base) MCG/ACT inhaler Inhale 2 puffs into the lungs every 6 (six) hours as needed for wheezing or shortness of breath.  Marland Kitchen apixaban (ELIQUIS) 5 MG TABS tablet Take 1 tablet (5 mg total) by mouth 2 (two) times daily.  Marland Kitchen atorvastatin (LIPITOR) 40 MG tablet TAKE 1 TABLET BY MOUTH EVERYDAY AT BEDTIME  . Blood Glucose Monitoring Suppl (ONE TOUCH ULTRA 2) w/Device KIT Use as instructed to check blood sugar 2 times daily as needed.  . clopidogrel (PLAVIX) 75 MG tablet Take 1 tablet (75 mg total) by mouth daily.  . Cyanocobalamin (VITAMIN B12) 500 MCG TABS Take 2 tablets by  mouth daily.  . dapagliflozin propanediol (FARXIGA) 5 MG TABS tablet Take 1 tablet (5 mg total) by mouth daily before breakfast.  . docusate sodium (COLACE) 100 MG capsule Take 1 capsule (100 mg total) by mouth daily.  . furosemide (LASIX) 20 MG tablet Take 1 tablet (20 mg total) by mouth daily.  Marland Kitchen gabapentin (NEURONTIN) 300 MG capsule Take 1 capsule (300 mg total) by mouth 2 (two) times daily.  Marland Kitchen glipiZIDE (GLUCOTROL) 5 MG tablet TAKE 1 TABLET (5 MG TOTAL) BY MOUTH 2 (TWO) TIMES DAILY BEFORE A MEAL.  Marland Kitchen glucose blood (ONETOUCH ULTRA) test strip Use as instructed to check blood sugar 2 times daily as needed  . HYDROcodone-acetaminophen (NORCO/VICODIN) 5-325 MG tablet Take 1 tablet by mouth every 6 (six) hours as needed (breakthrough pain).  . insulin glargine (LANTUS SOLOSTAR) 100 UNIT/ML Solostar Pen Inject 10 Units into the skin daily.  . isosorbide mononitrate (IMDUR) 30 MG 24 hr tablet Take 0.5 tablets (15 mg total) by mouth daily.  Marland Kitchen latanoprost (XALATAN) 0.005 % ophthalmic solution Place 1 drop into both eyes at bedtime.   . metFORMIN (GLUCOPHAGE) 500 MG tablet Take 1 tablet (500 mg total) by mouth 3 (three) times daily before meals.  . metoCLOPramide (REGLAN) 10 MG tablet Take 1 tablet (10 mg total) by mouth every 6 (six) hours as needed for nausea or vomiting.  . metoprolol succinate (TOPROL XL) 25 MG 24 hr tablet Take 0.5 tablets (12.5 mg total) by mouth daily.  Marland Kitchen  nitrofurantoin, macrocrystal-monohydrate, (MACROBID) 100 MG capsule Take 1 capsule (100 mg total) by mouth 2 (two) times daily.  . NON FORMULARY Antifungal cream from [place] as needed  . ondansetron (ZOFRAN) 4 MG tablet Take 1 tablet (4 mg total) by mouth every 8 (eight) hours as needed for nausea or vomiting.  . ondansetron (ZOFRAN-ODT) 4 MG disintegrating tablet Take 4 mg by mouth every 6 (six) hours as needed for nausea or vomiting.  Glory Rosebush Delica Lancets 91Y MISC Use as directed to check blood sugar 2 times  daily as needed  . pantoprazole (PROTONIX) 40 MG tablet Take 1 tablet (40 mg total) by mouth 2 (two) times daily.  . traZODone (DESYREL) 50 MG tablet Take 1.5 tablets (75 mg total) by mouth at bedtime as needed for sleep.   No facility-administered encounter medications on file as of 02/14/2020.    Current Diagnosis: Patient Active Problem List   Diagnosis Date Noted  . Dysuria 11/08/2019  . Right sided temporal headache 08/06/2019  . Atrial flutter with rapid ventricular response ([place])   . Acute deep vein thrombosis (DVT) of left tibial vein ([place]) 07/11/2019  . Acute cholecystitis 07/07/2019  . Pain of right breast 03/15/2019  . Health maintenance examination 12/11/2018  . Type 2 diabetes mellitus with severe nonproliferative diabetic retinopathy with macular edema, bilateral ([place]) 07/16/2018  . Coronary artery disease of native artery of native heart with stable angina pectoris ([place])   . Effort angina ([place]) 11/12/2017  . Abnormal stress ECG   . Dyspnea on exertion 11/04/2017  . GERD (gastroesophageal reflux disease) 08/15/2017  . Trigger finger 06/09/2017  . Nocturnal leg movements 03/25/2017  . Osteopenia 02/01/2017  . Advanced care planning/counseling discussion 01/02/2017  . Stage 3 chronic kidney disease due to type 2 diabetes mellitus ([place]) 08/26/2016  . Insomnia 08/26/2016  . Vitamin B12 deficiency 11/10/2015  . General weakness 08/04/2015  . Epistaxis 07/07/2015  . Fatty liver disease, nonalcoholic 78/29/5621  . Chronic diastolic CHF (congestive heart failure) ([place]) 08/27/2014  . Positive hepatitis C antibody test 08/27/2014  . Iron deficiency 08/27/2014  . NSVT (nonsustained ventricular tachycardia) ([place])   . Nausea without vomiting   . Autonomic orthostatic hypotension 07/11/2014  . S/P CABG x 3 07/08/2014  . History of cerebrovascular accident (CVA) in adulthood 07/08/2014  . DM (diabetes mellitus), type 2, uncontrolled w/neurologic complication ([place]) 30/86/5784   . Itching 03/16/2012  . Rash 03/16/2012  . Medicare annual wellness visit, initial 10/28/2011  . Hyperlipidemia associated with type 2 diabetes mellitus ([place])   . Idiopathic angioedema 06/23/2011  . Vitiligo 06/23/2011  . Dermatitis 06/23/2011  . MDD (major depressive disorder), recurrent severe, without psychosis ([place]) 05/08/2006  . Hypertension, essential 05/08/2006  . MENOPAUSAL SYNDROME 05/08/2006   Reviewed chart for medication changes ahead of medication coordination call.  Consults- 01/28/2020- Dr Radford Pax (Cardiology) since last care coordination call/Pharmacist visit.   Medication changes indicated:  Metoprolol Tartrate 25 mg discontinued and Metoprolol Succinate 25 mg taking 1/2 tablet daily started 01/28/2020.   BP Readings from Last 3 Encounters:  01/28/20 104/74  11/08/19 122/70  08/03/19 (!) 144/84    Lab Results  Component Value Date   HGBA1C 11.2 (A) 11/08/2019     Patient obtains medications through Vials  90 Days   Last adherence delivery included: Atorvastatin 40 mg -1tablet daily (bedtime) Clopidogrel 75 mg -1tablet daily (breakfast) Furosemide 20 mg- 1tablet daily (breakfast) Gabapentin 100 mg- 1table twice daily and 3 tablet nightly (breakfast, lunch, bedtime) Glipizide  5 mg -1table twice daily (breakfast, evening meal) Isosorbide Mononitrate 30 mg -0.5tablet daily (breakfast) Latanoprost 0.005 % eye drops- 1 drop each eye daily Metoprolol tartrate 25 mg 0.5tablet daily (breakfast) Pantoprazole 20 mg -1tablet daily (breakfast) Farxiga 5 mg -1 tablet daily (before breakfast). Ondansetron HCL 4 mg- 1 tablet every 6 hours as needed for nausea        Albuterol 108(90 base) mcg/ ACT- Inhale 2 puffs into the lungs every 6 (six) hours as needed for        wheezing or shortness of breath   Patient did not decline any medications last month.   Patient is due for next adherence delivery on: 02/18/2020. Called patient and reviewed medications  and coordinated delivery.  This delivery to include: Farxiga 5 mg -1 tablet daily Clopidogrel 75 mg -1tablet daily  Albuterol Hfa- Inhale 2 puffs into lungs every 6 hours as needed Eliquis 5 mg- 1 tablet twice daily Ondansetron HCL 4 mg- 1 tablet every 6 hours as needed for nausea Gabapentin 100 mg- 1table twice daily and 3 tablet nightly   No short supply or acute fill needed prior to delivery.  Patient declined the following medications: Atorvastatin 40 mg -1tablet daily       Pantoprazole 20 mg -1tablet daily  Furosemide 20 mg- 1tablet daily  Gabapentin 300 mg- 1 tablet twice daily Glipizide 5 mg -1table twice daily  Latanoprost 0.005 % eye drops- 1 drop each eye daily   Due to receiving a 90 day supply on 01/19/2020  Metoprolol succinate 25 mg 0.5tablet daily due to receiving a 90 day supply on 01/31/2020.        Basaglar- Inject 10 units sq daily due to receiving a supply on 01/06/2020. Metoclopram 10 mg- due to PRN Use. Nitrofurantoin due to completing treatment.  OneTouch test strips and lancets due to having a supplies on hand but patient is not check blood sugars at this time.  Pantoprazole 40 mg- 1 tablet daily due to placing on hold. Patient will double up on Pantoprazole 35m until finished and then will refill this strength.  Trazodone 50 mg- 1 and 1/2 tablet nightly due to PRN use. Metformin 500 mg- Patient states she still has an adequate supply from [facility].   Coordinated acute fill form for Isosorbide Mononitrate 30 mg -0.5tablet daily (vial) for delivery on 02/21/2020.  Patient needs refills for Ondansetron and Farxiga.  Confirmed delivery date of 02/18/2020, advised patient that pharmacy will contact them the morning of delivery.   Follow-Up:  Coordination of Enhanced Pharmacy Services and Pharmacist Review  02/14/2020- Patient states she has not check blood sugars in a while. [facility], she will  get some but informed me that next year her test strips and lancets will be free with insurance. Patient stated she still had some test strips and lancets at home and will check blood sugars. Patient also wants to hold the Pantoprazole 40 mg until she finishes all the Pantoprazole 20 mg taking 2 tablets daily. Patient complained of always waking up with nausea, not sure if this is because she does not have a gallbladder, she also mentioned frequent gas and belching, patient will follow up with GI.  02/15/2020- Patient called stating her insulin was to be sent to the pharmacy, provider sent. Inquired if Basaglar, patient stated no she has enough of this medication but did not remember what medication it was. Informed patient that I will look into it.  02/16/2020- There are no other insulin noted at [facility], will follow up with patient. 02/17/2020- Missed call from patient, called patient back, no answer left message to return call. 02/18/2020- Noticed that recent prescription sent to [facility] was Lantus Solostar on 02/10/2020 from PCP. Prescription not received at [facility], sent request to PharmD to see if PCP office can send again, placing medication on acute fill form with Isosorbide for 02/21/2020 delivery. [name], CPP notified.  Pattricia Boss, [place] Pharmacist Assistant [phone]

## 2020-02-15 NOTE — Telephone Encounter (Signed)
Spoke with pt confirming she needed Lantus pen.  States she does use the pen and expresses her thanks for the rx.

## 2020-02-17 ENCOUNTER — Telehealth: Payer: Self-pay

## 2020-02-17 MED ORDER — DAPAGLIFLOZIN PROPANEDIOL 5 MG PO TABS: 5.0000 mg | ORAL_TABLET | Freq: Every day | ORAL | 0 refills | Status: DC

## 2020-02-17 MED ORDER — ONDANSETRON HCL 4 MG PO TABS: 4.0000 mg | ORAL_TABLET | Freq: Three times a day (TID) | ORAL | 0 refills | Status: DC | PRN

## 2020-02-17 NOTE — Telephone Encounter (Signed)
E-scribed refill 

## 2020-02-17 NOTE — Telephone Encounter (Signed)
-----   Message from [name], [facility] sent at 02/17/2020 11:00 AM EST ----- Regarding: Refill [name] is requesting a medication refill on Farxiga.  Please send refills to [facility].  Thank you,  [name], PharmD Clinical Pharmacist [facility] [phone]

## 2020-02-17 NOTE — Telephone Encounter (Signed)
-----   Message from [place], [facility] sent at 02/17/2020 12:56 PM EST ----- Regarding: refill request Refill also needed on Zofran to [facility]. Thanks!

## 2020-02-18 ENCOUNTER — Ambulatory Visit: Payer: Medicare HMO | Admitting: Podiatry

## 2020-02-18 ENCOUNTER — Other Ambulatory Visit: Payer: Self-pay

## 2020-02-18 NOTE — Telephone Encounter (Signed)
-----   Message from [place], [facility] sent at 02/18/2020 10:45 AM EST ----- Regarding: Refill Patient also needs refill on gabapentin, thank you kindly! [facility]  [name], PharmD Clinical Pharmacist [facility] at [facility] [phone]

## 2020-02-18 NOTE — Telephone Encounter (Signed)
Gabapentin Last rx:  09/02/19, #60 Last OV:  11/08/19, 3 mo f/u Next OV:  03/08/20, 1 mo DM f/u

## 2020-02-21 ENCOUNTER — Telehealth: Payer: Self-pay

## 2020-02-21 NOTE — Chronic Care Management (AMB) (Addendum)
Chronic Care Management Pharmacy Assistant   Name: Maria Fox  MRN: [id number] DOB: 16-Feb-1946  Reason for Encounter: Medication Review  Patient was contacted regarding which insulin she is currently taking - reports Lantus 10 units daily. Reviewed by [name].   Nancee Liter is preferred by insurance now. Lantus not covered. Prescription for Basaglar 10 units daily on file at [facility]. Patient will be switched back to [place] at 10 units daily - [name], PharmD  PCP : [name], MD  Allergies:   Allergies  Allergen Reactions   Bee Venom     Throat swelling   Mushroom Extract Complex Anaphylaxis   Penicillins Anaphylaxis, Hives and Swelling    Has patient had a PCN reaction causing immediate rash, facial/tongue/throat swelling, SOB or lightheadedness with hypotension: Yes Has patient had a PCN reaction causing severe rash involving mucus membranes or skin necrosis: Yes Has patient had a PCN reaction that required hospitalization Yes Has patient had a PCN reaction occurring within the last 10 years: No If all of the above answers are "NO", then may proceed with Cephalosporin use.    Codeine Nausea And Vomiting   Sulfa Antibiotics Nausea And Vomiting   Iohexol    Erythromycin Base Rash    Medications: Outpatient Encounter Medications as of 02/21/2020  Medication Sig   acetaminophen (TYLENOL) 500 MG tablet Take 500 mg by mouth as needed.   albuterol (PROAIR HFA) 108 (90 Base) MCG/ACT inhaler Inhale 2 puffs into the lungs every 6 (six) hours as needed for wheezing or shortness of breath.   apixaban (ELIQUIS) 5 MG TABS tablet Take 1 tablet (5 mg total) by mouth 2 (two) times daily.   atorvastatin (LIPITOR) 40 MG tablet TAKE 1 TABLET BY MOUTH EVERYDAY AT BEDTIME   Blood Glucose Monitoring Suppl (ONE TOUCH ULTRA 2) w/Device KIT Use as instructed to check blood sugar 2 times daily as needed.   clopidogrel (PLAVIX) 75 MG tablet Take 1 tablet (75 mg total) by  mouth daily.   Cyanocobalamin (VITAMIN B12) 500 MCG TABS Take 2 tablets by mouth daily.   dapagliflozin propanediol (FARXIGA) 5 MG TABS tablet Take 1 tablet (5 mg total) by mouth daily before breakfast.   docusate sodium (COLACE) 100 MG capsule Take 1 capsule (100 mg total) by mouth daily.   furosemide (LASIX) 20 MG tablet Take 1 tablet (20 mg total) by mouth daily.   gabapentin (NEURONTIN) 300 MG capsule Take 1 capsule (300 mg total) by mouth 2 (two) times daily.   glipiZIDE (GLUCOTROL) 5 MG tablet TAKE 1 TABLET (5 MG TOTAL) BY MOUTH 2 (TWO) TIMES DAILY BEFORE A MEAL.   glucose blood (ONETOUCH ULTRA) test strip Use as instructed to check blood sugar 2 times daily as needed   HYDROcodone-acetaminophen (NORCO/VICODIN) 5-325 MG tablet Take 1 tablet by mouth every 6 (six) hours as needed (breakthrough pain).   insulin glargine (LANTUS SOLOSTAR) 100 UNIT/ML Solostar Pen Inject 10 Units into the skin daily.   isosorbide mononitrate (IMDUR) 30 MG 24 hr tablet Take 0.5 tablets (15 mg total) by mouth daily.   latanoprost (XALATAN) 0.005 % ophthalmic solution Place 1 drop into both eyes at bedtime.    metFORMIN (GLUCOPHAGE) 500 MG tablet Take 1 tablet (500 mg total) by mouth 3 (three) times daily before meals.   metoCLOPramide (REGLAN) 10 MG tablet Take 1 tablet (10 mg total) by mouth every 6 (six) hours as needed for nausea or vomiting.   metoprolol succinate (TOPROL XL) 25 MG  24 hr tablet Take 0.5 tablets (12.5 mg total) by mouth daily.   nitrofurantoin, macrocrystal-monohydrate, (MACROBID) 100 MG capsule Take 1 capsule (100 mg total) by mouth 2 (two) times daily.   NON FORMULARY Antifungal cream from [place] as needed   ondansetron (ZOFRAN) 4 MG tablet Take 1 tablet (4 mg total) by mouth every 8 (eight) hours as needed for nausea or vomiting.   ondansetron (ZOFRAN-ODT) 4 MG disintegrating tablet Take 4 mg by mouth every 6 (six) hours as needed for nausea or vomiting.   OneTouch Delica  Lancets 70V MISC Use as directed to check blood sugar 2 times daily as needed   pantoprazole (PROTONIX) 40 MG tablet Take 1 tablet (40 mg total) by mouth 2 (two) times daily.   traZODone (DESYREL) 50 MG tablet Take 1.5 tablets (75 mg total) by mouth at bedtime as needed for sleep.   No facility-administered encounter medications on file as of 02/21/2020.    Current Diagnosis: Patient Active Problem List   Diagnosis Date Noted   Dysuria 11/08/2019   Right sided temporal headache 08/06/2019   Atrial flutter with rapid ventricular response ([name])    Acute deep vein thrombosis (DVT) of left tibial vein ([place]) 07/11/2019   Acute cholecystitis 07/07/2019   Pain of right breast 03/15/2019   Health maintenance examination 12/11/2018   Type 2 diabetes mellitus with severe nonproliferative diabetic retinopathy with macular edema, bilateral ([place]) 07/16/2018   Coronary artery disease of native artery of native heart with stable angina pectoris ([name])    Effort angina ([place]) 11/12/2017   Abnormal stress ECG    Dyspnea on exertion 11/04/2017   GERD (gastroesophageal reflux disease) 08/15/2017   Trigger finger 06/09/2017   Nocturnal leg movements 03/25/2017   Osteopenia 02/01/2017   Advanced care planning/counseling discussion 01/02/2017   Stage 3 chronic kidney disease due to type 2 diabetes mellitus ([place]) 08/26/2016   Insomnia 08/26/2016   Vitamin B12 deficiency 11/10/2015   General weakness 08/04/2015   Epistaxis 07/07/2015   Fatty liver disease, nonalcoholic 77/93/9030   Chronic diastolic CHF (congestive heart failure) ([place]) 08/27/2014   Positive hepatitis C antibody test 08/27/2014   Iron deficiency 08/27/2014   NSVT (nonsustained ventricular tachycardia) ([name])    Nausea without vomiting    Autonomic orthostatic hypotension 07/11/2014   S/P CABG x 3 07/08/2014   History of cerebrovascular accident (CVA) in adulthood 07/08/2014   DM (diabetes mellitus), type 2, uncontrolled w/neurologic  complication ([place]) 12/01/3005   Itching 03/16/2012   Rash 03/16/2012   Medicare annual wellness visit, initial 10/28/2011   Hyperlipidemia associated with type 2 diabetes mellitus ([place])    Idiopathic angioedema 06/23/2011   Vitiligo 06/23/2011   Dermatitis 06/23/2011   MDD (major depressive disorder), recurrent severe, without psychosis ([place]) 05/08/2006   Hypertension, essential 05/08/2006   MENOPAUSAL SYNDROME 05/08/2006    Follow-Up:  Pharmacist Review   [name], PharmD Clinical Pharmacist [facility]  Margaretmary Dys, [place] Pharmacy Assistant [phone]

## 2020-02-22 MED ORDER — GABAPENTIN 300 MG PO CAPS: 300.0000 mg | ORAL_CAPSULE | Freq: Two times a day (BID) | ORAL | 11 refills | Status: DC

## 2020-02-25 ENCOUNTER — Telehealth: Payer: Self-pay | Admitting: Family Medicine

## 2020-02-25 NOTE — Telephone Encounter (Signed)
I did not call pt.  And I don't see any messages or result notes showing anyone else from our office called pt.

## 2020-02-25 NOTE — Telephone Encounter (Signed)
Pt call returning a call from [facility]

## 2020-03-08 ENCOUNTER — Ambulatory Visit: Payer: Medicare HMO | Admitting: Family Medicine

## 2020-03-08 DIAGNOSIS — Z0289 Encounter for other administrative examinations: Secondary | ICD-10-CM

## 2020-03-09 ENCOUNTER — Telehealth: Payer: Self-pay

## 2020-03-09 NOTE — Chronic Care Management (AMB) (Signed)
Chronic Care Management Pharmacy Assistant   Name: Maria Fox  MRN: [id number] DOB: 14-May-1945  Reason for Encounter: Medication Review- Adherence review  Reviewed chart and attempted to review adherence measures. No insurance data available on hypertension or diabetes medication adherence.   PCP : [name], MD  Allergies:   Allergies  Allergen Reactions  . Bee Venom     Throat swelling  . Mushroom Extract Complex Anaphylaxis  . Penicillins Anaphylaxis, Hives and Swelling    Has patient had a PCN reaction causing immediate rash, facial/tongue/throat swelling, SOB or lightheadedness with hypotension: Yes Has patient had a PCN reaction causing severe rash involving mucus membranes or skin necrosis: Yes Has patient had a PCN reaction that required hospitalization Yes Has patient had a PCN reaction occurring within the last 10 years: No If all of the above answers are "NO", then may proceed with Cephalosporin use.   . Codeine Nausea And Vomiting  . Sulfa Antibiotics Nausea And Vomiting  . Iohexol   . Erythromycin Base Rash    Medications: Outpatient Encounter Medications as of 03/09/2020  Medication Sig  . acetaminophen (TYLENOL) 500 MG tablet Take 500 mg by mouth as needed.  Marland Kitchen albuterol (PROAIR HFA) 108 (90 Base) MCG/ACT inhaler Inhale 2 puffs into the lungs every 6 (six) hours as needed for wheezing or shortness of breath.  Marland Kitchen apixaban (ELIQUIS) 5 MG TABS tablet Take 1 tablet (5 mg total) by mouth 2 (two) times daily.  Marland Kitchen atorvastatin (LIPITOR) 40 MG tablet TAKE 1 TABLET BY MOUTH EVERYDAY AT BEDTIME  . Blood Glucose Monitoring Suppl (ONE TOUCH ULTRA 2) w/Device KIT Use as instructed to check blood sugar 2 times daily as needed.  . clopidogrel (PLAVIX) 75 MG tablet Take 1 tablet (75 mg total) by mouth daily.  . Cyanocobalamin (VITAMIN B12) 500 MCG TABS Take 2 tablets by mouth daily.  . dapagliflozin propanediol (FARXIGA) 5 MG TABS tablet Take 1 tablet (5 mg total) by  mouth daily before breakfast.  . docusate sodium (COLACE) 100 MG capsule Take 1 capsule (100 mg total) by mouth daily.  . furosemide (LASIX) 20 MG tablet Take 1 tablet (20 mg total) by mouth daily.  Marland Kitchen gabapentin (NEURONTIN) 300 MG capsule Take 1 capsule (300 mg total) by mouth 2 (two) times daily.  Marland Kitchen glipiZIDE (GLUCOTROL) 5 MG tablet TAKE 1 TABLET (5 MG TOTAL) BY MOUTH 2 (TWO) TIMES DAILY BEFORE A MEAL.  Marland Kitchen glucose blood (ONETOUCH ULTRA) test strip Use as instructed to check blood sugar 2 times daily as needed  . HYDROcodone-acetaminophen (NORCO/VICODIN) 5-325 MG tablet Take 1 tablet by mouth every 6 (six) hours as needed (breakthrough pain).  . insulin glargine (LANTUS SOLOSTAR) 100 UNIT/ML Solostar Pen Inject 10 Units into the skin daily.  . isosorbide mononitrate (IMDUR) 30 MG 24 hr tablet Take 0.5 tablets (15 mg total) by mouth daily.  Marland Kitchen latanoprost (XALATAN) 0.005 % ophthalmic solution Place 1 drop into both eyes at bedtime.   . metFORMIN (GLUCOPHAGE) 500 MG tablet Take 1 tablet (500 mg total) by mouth 3 (three) times daily before meals.  . metoCLOPramide (REGLAN) 10 MG tablet Take 1 tablet (10 mg total) by mouth every 6 (six) hours as needed for nausea or vomiting.  . metoprolol succinate (TOPROL XL) 25 MG 24 hr tablet Take 0.5 tablets (12.5 mg total) by mouth daily.  . nitrofurantoin, macrocrystal-monohydrate, (MACROBID) 100 MG capsule Take 1 capsule (100 mg total) by mouth 2 (two) times daily.  Marland Kitchen NON  FORMULARY Antifungal cream from [place] as needed  . ondansetron (ZOFRAN) 4 MG tablet Take 1 tablet (4 mg total) by mouth every 8 (eight) hours as needed for nausea or vomiting.  . ondansetron (ZOFRAN-ODT) 4 MG disintegrating tablet Take 4 mg by mouth every 6 (six) hours as needed for nausea or vomiting.  Glory Rosebush Delica Lancets 80O MISC Use as directed to check blood sugar 2 times daily as needed  . pantoprazole (PROTONIX) 40 MG tablet Take 1 tablet (40 mg total) by mouth 2 (two)  times daily.  . traZODone (DESYREL) 50 MG tablet Take 1.5 tablets (75 mg total) by mouth at bedtime as needed for sleep.   No facility-administered encounter medications on file as of 03/09/2020.    Current Diagnosis: Patient Active Problem List   Diagnosis Date Noted  . Dysuria 11/08/2019  . Right sided temporal headache 08/06/2019  . Atrial flutter with rapid ventricular response ([place])   . Acute deep vein thrombosis (DVT) of left tibial vein ([place]) 07/11/2019  . Acute cholecystitis 07/07/2019  . Pain of right breast 03/15/2019  . Health maintenance examination 12/11/2018  . Type 2 diabetes mellitus with severe nonproliferative diabetic retinopathy with macular edema, bilateral ([place]) 07/16/2018  . Coronary artery disease of native artery of native heart with stable angina pectoris ([place])   . Effort angina ([place]) 11/12/2017  . Abnormal stress ECG   . Dyspnea on exertion 11/04/2017  . GERD (gastroesophageal reflux disease) 08/15/2017  . Trigger finger 06/09/2017  . Nocturnal leg movements 03/25/2017  . Osteopenia 02/01/2017  . Advanced care planning/counseling discussion 01/02/2017  . Stage 3 chronic kidney disease due to type 2 diabetes mellitus ([place]) 08/26/2016  . Insomnia 08/26/2016  . Vitamin B12 deficiency 11/10/2015  . General weakness 08/04/2015  . Epistaxis 07/07/2015  . Fatty liver disease, nonalcoholic 12/39/3594  . Chronic diastolic CHF (congestive heart failure) ([place]) 08/27/2014  . Positive hepatitis C antibody test 08/27/2014  . Iron deficiency 08/27/2014  . NSVT (nonsustained ventricular tachycardia) ([place])   . Nausea without vomiting   . Autonomic orthostatic hypotension 07/11/2014  . S/P CABG x 3 07/08/2014  . History of cerebrovascular accident (CVA) in adulthood 07/08/2014  . DM (diabetes mellitus), type 2, uncontrolled w/neurologic complication ([place]) 11/14/254  . Itching 03/16/2012  . Rash 03/16/2012  . Medicare annual wellness visit, initial 10/28/2011  .  Hyperlipidemia associated with type 2 diabetes mellitus ([place])   . Idiopathic angioedema 06/23/2011  . Vitiligo 06/23/2011  . Dermatitis 06/23/2011  . MDD (major depressive disorder), recurrent severe, without psychosis ([place]) 05/08/2006  . Hypertension, essential 05/08/2006  . MENOPAUSAL SYNDROME 05/08/2006    Follow-Up:  Pharmacist Review   [name], CPP notified  Margaretmary Dys, [place] Pharmacy Assistant [phone]

## 2020-03-11 DIAGNOSIS — U071 COVID-19: Secondary | ICD-10-CM

## 2020-03-11 HISTORY — DX: COVID-19: U07.1

## 2020-03-15 ENCOUNTER — Telehealth: Payer: Self-pay

## 2020-03-15 NOTE — Chronic Care Management (AMB) (Signed)
Chronic Care Management Pharmacy Assistant   Name: Maria Fox  MRN: [id number] DOB: 1946-01-13  Reason for Encounter: Refill request   PCP : [name], MD  Allergies:   Allergies  Allergen Reactions  . Bee Venom     Throat swelling  . Mushroom Extract Complex Anaphylaxis  . Penicillins Anaphylaxis, Hives and Swelling    Has patient had a PCN reaction causing immediate rash, facial/tongue/throat swelling, SOB or lightheadedness with hypotension: Yes Has patient had a PCN reaction causing severe rash involving mucus membranes or skin necrosis: Yes Has patient had a PCN reaction that required hospitalization Yes Has patient had a PCN reaction occurring within the last 10 years: No If all of the above answers are "NO", then may proceed with Cephalosporin use.   . Codeine Nausea And Vomiting  . Sulfa Antibiotics Nausea And Vomiting  . Iohexol   . Erythromycin Base Rash    Medications: Outpatient Encounter Medications as of 03/15/2020  Medication Sig  . acetaminophen (TYLENOL) 500 MG tablet Take 500 mg by mouth as needed.  Marland Kitchen albuterol (PROAIR HFA) 108 (90 Base) MCG/ACT inhaler Inhale 2 puffs into the lungs every 6 (six) hours as needed for wheezing or shortness of breath.  Marland Kitchen apixaban (ELIQUIS) 5 MG TABS tablet Take 1 tablet (5 mg total) by mouth 2 (two) times daily.  Marland Kitchen atorvastatin (LIPITOR) 40 MG tablet TAKE 1 TABLET BY MOUTH EVERYDAY AT BEDTIME  . Blood Glucose Monitoring Suppl (ONE TOUCH ULTRA 2) w/Device KIT Use as instructed to check blood sugar 2 times daily as needed.  . clopidogrel (PLAVIX) 75 MG tablet Take 1 tablet (75 mg total) by mouth daily.  . Cyanocobalamin (VITAMIN B12) 500 MCG TABS Take 2 tablets by mouth daily.  . dapagliflozin propanediol (FARXIGA) 5 MG TABS tablet Take 1 tablet (5 mg total) by mouth daily before breakfast.  . docusate sodium (COLACE) 100 MG capsule Take 1 capsule (100 mg total) by mouth daily.  . furosemide (LASIX) 20 MG tablet Take  1 tablet (20 mg total) by mouth daily.  Marland Kitchen gabapentin (NEURONTIN) 300 MG capsule Take 1 capsule (300 mg total) by mouth 2 (two) times daily.  Marland Kitchen glipiZIDE (GLUCOTROL) 5 MG tablet TAKE 1 TABLET (5 MG TOTAL) BY MOUTH 2 (TWO) TIMES DAILY BEFORE A MEAL.  Marland Kitchen glucose blood (ONETOUCH ULTRA) test strip Use as instructed to check blood sugar 2 times daily as needed  . HYDROcodone-acetaminophen (NORCO/VICODIN) 5-325 MG tablet Take 1 tablet by mouth every 6 (six) hours as needed (breakthrough pain).  . insulin glargine (LANTUS SOLOSTAR) 100 UNIT/ML Solostar Pen Inject 10 Units into the skin daily.  . isosorbide mononitrate (IMDUR) 30 MG 24 hr tablet Take 0.5 tablets (15 mg total) by mouth daily.  Marland Kitchen latanoprost (XALATAN) 0.005 % ophthalmic solution Place 1 drop into both eyes at bedtime.   . metFORMIN (GLUCOPHAGE) 500 MG tablet Take 1 tablet (500 mg total) by mouth 3 (three) times daily before meals.  . metoCLOPramide (REGLAN) 10 MG tablet Take 1 tablet (10 mg total) by mouth every 6 (six) hours as needed for nausea or vomiting.  . metoprolol succinate (TOPROL XL) 25 MG 24 hr tablet Take 0.5 tablets (12.5 mg total) by mouth daily.  . nitrofurantoin, macrocrystal-monohydrate, (MACROBID) 100 MG capsule Take 1 capsule (100 mg total) by mouth 2 (two) times daily.  . NON FORMULARY Antifungal cream from [place] as needed  . ondansetron (ZOFRAN) 4 MG tablet Take 1 tablet (4 mg total)  by mouth every 8 (eight) hours as needed for nausea or vomiting.  . ondansetron (ZOFRAN-ODT) 4 MG disintegrating tablet Take 4 mg by mouth every 6 (six) hours as needed for nausea or vomiting.  Glory Rosebush Delica Lancets 27O MISC Use as directed to check blood sugar 2 times daily as needed  . pantoprazole (PROTONIX) 40 MG tablet Take 1 tablet (40 mg total) by mouth 2 (two) times daily.  . traZODone (DESYREL) 50 MG tablet Take 1.5 tablets (75 mg total) by mouth at bedtime as needed for sleep.   No facility-administered encounter  medications on file as of 03/15/2020.    Current Diagnosis: Patient Active Problem List   Diagnosis Date Noted  . Dysuria 11/08/2019  . Right sided temporal headache 08/06/2019  . Atrial flutter with rapid ventricular response ([place])   . Acute deep vein thrombosis (DVT) of left tibial vein ([place]) 07/11/2019  . Acute cholecystitis 07/07/2019  . Pain of right breast 03/15/2019  . Health maintenance examination 12/11/2018  . Type 2 diabetes mellitus with severe nonproliferative diabetic retinopathy with macular edema, bilateral ([place]) 07/16/2018  . Coronary artery disease of native artery of native heart with stable angina pectoris ([place])   . Effort angina ([place]) 11/12/2017  . Abnormal stress ECG   . Dyspnea on exertion 11/04/2017  . GERD (gastroesophageal reflux disease) 08/15/2017  . Trigger finger 06/09/2017  . Nocturnal leg movements 03/25/2017  . Osteopenia 02/01/2017  . Advanced care planning/counseling discussion 01/02/2017  . Stage 3 chronic kidney disease due to type 2 diabetes mellitus ([place]) 08/26/2016  . Insomnia 08/26/2016  . Vitamin B12 deficiency 11/10/2015  . General weakness 08/04/2015  . Epistaxis 07/07/2015  . Fatty liver disease, nonalcoholic 35/00/9381  . Chronic diastolic CHF (congestive heart failure) ([place]) 08/27/2014  . Positive hepatitis C antibody test 08/27/2014  . Iron deficiency 08/27/2014  . NSVT (nonsustained ventricular tachycardia) ([place])   . Nausea without vomiting   . Autonomic orthostatic hypotension 07/11/2014  . S/P CABG x 3 07/08/2014  . History of cerebrovascular accident (CVA) in adulthood 07/08/2014  . DM (diabetes mellitus), type 2, uncontrolled w/neurologic complication ([place]) 82/99/3716  . Itching 03/16/2012  . Rash 03/16/2012  . Medicare annual wellness visit, initial 10/28/2011  . Hyperlipidemia associated with type 2 diabetes mellitus ([place])   . Idiopathic angioedema 06/23/2011  . Vitiligo 06/23/2011  . Dermatitis 06/23/2011  . MDD (major  depressive disorder), recurrent severe, without psychosis ([place]) 05/08/2006  . Hypertension, essential 05/08/2006  . MENOPAUSAL SYNDROME 05/08/2006   Ms. Baehr contacted [place] for refill of her Latanoprost 0.005%. Acute fill form was filled out and sent to pharmacy. Patient aware [facility] will work on delivering today.   Follow-Up:  Comptroller and Pharmacist Review   [name], CPP notified  Margaretmary Dys, [place] Pharmacy Assistant [phone]

## 2020-03-17 ENCOUNTER — Telehealth: Payer: Self-pay

## 2020-03-17 ENCOUNTER — Other Ambulatory Visit: Payer: Self-pay | Admitting: *Deleted

## 2020-03-17 MED ORDER — DAPAGLIFLOZIN PROPANEDIOL 5 MG PO TABS: 5.0000 mg | ORAL_TABLET | Freq: Every day | ORAL | 0 refills | Status: DC

## 2020-03-17 MED ORDER — METFORMIN HCL 500 MG PO TABS: 500.0000 mg | ORAL_TABLET | Freq: Three times a day (TID) | ORAL | 0 refills | Status: DC

## 2020-03-17 MED ORDER — ONDANSETRON HCL 4 MG PO TABS: 4.0000 mg | ORAL_TABLET | Freq: Three times a day (TID) | ORAL | 0 refills | Status: DC | PRN

## 2020-03-17 NOTE — Chronic Care Management (AMB) (Addendum)
Chronic Care Management Pharmacy Assistant   Name: Maria Fox  MRN: [id number] DOB: Nov 09, 1945  Reason for Encounter: Medication Review  Patient Questions:  1.  Have you seen any other providers since your last visit? No  2.  Any changes in your medicines or health? No  PCP : Maria Bush, MD  Allergies:   Allergies  Allergen Reactions   Bee Venom     Throat swelling   Mushroom Extract Complex Anaphylaxis   Penicillins Anaphylaxis, Hives and Swelling    Has patient had a PCN reaction causing immediate rash, facial/tongue/throat swelling, SOB or lightheadedness with hypotension: Yes Has patient had a PCN reaction causing severe rash involving mucus membranes or skin necrosis: Yes Has patient had a PCN reaction that required hospitalization Yes Has patient had a PCN reaction occurring within the last 10 years: No If all of the above answers are "NO", then may proceed with Cephalosporin use.    Codeine Nausea And Vomiting   Sulfa Antibiotics Nausea And Vomiting   Iohexol    Erythromycin Base Rash    Medications: Outpatient Encounter Medications as of 03/17/2020  Medication Sig   acetaminophen (TYLENOL) 500 MG tablet Take 500 mg by mouth as needed.   albuterol (PROAIR HFA) 108 (90 Base) MCG/ACT inhaler Inhale 2 puffs into the lungs every 6 (six) hours as needed for wheezing or shortness of breath.   apixaban (ELIQUIS) 5 MG TABS tablet Take 1 tablet (5 mg total) by mouth 2 (two) times daily.   atorvastatin (LIPITOR) 40 MG tablet TAKE 1 TABLET BY MOUTH EVERYDAY AT BEDTIME   Blood Glucose Monitoring Suppl (ONE TOUCH ULTRA 2) w/Device KIT Use as instructed to check blood sugar 2 times daily as needed.   clopidogrel (PLAVIX) 75 MG tablet Take 1 tablet (75 mg total) by mouth daily.   Cyanocobalamin (VITAMIN B12) 500 MCG TABS Take 2 tablets by mouth daily.   dapagliflozin propanediol (FARXIGA) 5 MG TABS tablet Take 1 tablet (5 mg total) by mouth daily before breakfast.    docusate sodium (COLACE) 100 MG capsule Take 1 capsule (100 mg total) by mouth daily.   furosemide (LASIX) 20 MG tablet Take 1 tablet (20 mg total) by mouth daily.   gabapentin (NEURONTIN) 300 MG capsule Take 1 capsule (300 mg total) by mouth 2 (two) times daily.   glipiZIDE (GLUCOTROL) 5 MG tablet TAKE 1 TABLET (5 MG TOTAL) BY MOUTH 2 (TWO) TIMES DAILY BEFORE A MEAL.   glucose blood (ONETOUCH ULTRA) test strip Use as instructed to check blood sugar 2 times daily as needed   HYDROcodone-acetaminophen (NORCO/VICODIN) 5-325 MG tablet Take 1 tablet by mouth every 6 (six) hours as needed (breakthrough pain).   insulin glargine (LANTUS SOLOSTAR) 100 UNIT/ML Solostar Pen Inject 10 Units into the skin daily.   isosorbide mononitrate (IMDUR) 30 MG 24 hr tablet Take 0.5 tablets (15 mg total) by mouth daily.   latanoprost (XALATAN) 0.005 % ophthalmic solution Place 1 drop into both eyes at bedtime.    metFORMIN (GLUCOPHAGE) 500 MG tablet Take 1 tablet (500 mg total) by mouth 3 (three) times daily before meals.   metoCLOPramide (REGLAN) 10 MG tablet Take 1 tablet (10 mg total) by mouth every 6 (six) hours as needed for nausea or vomiting.   metoprolol succinate (TOPROL XL) 25 MG 24 hr tablet Take 0.5 tablets (12.5 mg total) by mouth daily.   nitrofurantoin, macrocrystal-monohydrate, (MACROBID) 100 MG capsule Take 1 capsule (100 mg total) by mouth 2 (  two) times daily.   NON FORMULARY Antifungal cream from [facility] as needed   ondansetron (ZOFRAN) 4 MG tablet Take 1 tablet (4 mg total) by mouth every 8 (eight) hours as needed for nausea or vomiting.   ondansetron (ZOFRAN-ODT) 4 MG disintegrating tablet Take 4 mg by mouth every 6 (six) hours as needed for nausea or vomiting.   OneTouch Delica Lancets 44Y MISC Use as directed to check blood sugar 2 times daily as needed   pantoprazole (PROTONIX) 40 MG tablet Take 1 tablet (40 mg total) by mouth 2 (two) times daily.   traZODone (DESYREL) 50 MG tablet  Take 1.5 tablets (75 mg total) by mouth at bedtime as needed for sleep.   No facility-administered encounter medications on file as of 03/17/2020.    Current Diagnosis: Patient Active Problem List   Diagnosis Date Noted   Dysuria 11/08/2019   Right sided temporal headache 08/06/2019   Atrial flutter with rapid ventricular response ([name])    Acute deep vein thrombosis (DVT) of left tibial vein ([place]) 07/11/2019   Acute cholecystitis 07/07/2019   Pain of right breast 03/15/2019   Health maintenance examination 12/11/2018   Type 2 diabetes mellitus with severe nonproliferative diabetic retinopathy with macular edema, bilateral (Maria Fox) 07/16/2018   Coronary artery disease of native artery of native heart with stable angina pectoris ([name])    Effort angina (Maria Fox) 11/12/2017   Abnormal stress ECG    Dyspnea on exertion 11/04/2017   GERD (gastroesophageal reflux disease) 08/15/2017   Trigger finger 06/09/2017   Nocturnal leg movements 03/25/2017   Osteopenia 02/01/2017   Advanced care planning/counseling discussion 01/02/2017   Stage 3 chronic kidney disease due to type 2 diabetes mellitus (Maria Fox) 08/26/2016   Insomnia 08/26/2016   Vitamin B12 deficiency 11/10/2015   General weakness 08/04/2015   Epistaxis 07/07/2015   Fatty liver disease, nonalcoholic 18/56/3149   Chronic diastolic CHF (congestive heart failure) (Maria Fox) 08/27/2014   Positive hepatitis C antibody test 08/27/2014   Iron deficiency 08/27/2014   NSVT (nonsustained ventricular tachycardia) ([name])    Nausea without vomiting    Autonomic orthostatic hypotension 07/11/2014   S/P CABG x 3 07/08/2014   History of cerebrovascular accident (CVA) in adulthood 07/08/2014   DM (diabetes mellitus), type 2, uncontrolled w/neurologic complication (Maria Fox) 70/26/3785   Itching 03/16/2012   Rash 03/16/2012   Medicare annual wellness visit, initial 10/28/2011   Hyperlipidemia associated with type 2 diabetes mellitus (Maria Fox)    Idiopathic angioedema  06/23/2011   Vitiligo 06/23/2011   Dermatitis 06/23/2011   MDD (major depressive disorder), recurrent severe, without psychosis (Maria Fox) 05/08/2006   Hypertension, essential 05/08/2006   MENOPAUSAL SYNDROME 05/08/2006   Reviewed chart for medication changes ahead of medication coordination call.  No OVs, Consults, or hospital visits since last care coordination call/Pharmacist visit.  No medication changes indicated.  BP Readings from Last 3 Encounters:  01/28/20 104/74  11/08/19 122/70  08/03/19 (!) 144/84    Lab Results  Component Value Date   HGBA1C 11.2 (A) 11/08/2019     Patient obtains medications through Vials  90 Days   Last adherence delivery included:  Farxiga 5 mg - 1 tablet daily Clopidogrel 75 mg - 1 tablet daily  Albuterol Hfa- Inhale 2 puffs into lungs every 6 hours as needed Eliquis 5 mg- 1 tablet twice daily Ondansetron HCL 4 mg- 1 tablet every 6 hours as needed for nausea Gabapentin 100 mg- 1 table twice daily and 3 tablet nightly   Patient declined the  following medications last month: Atorvastatin 40 mg - 1 tablet daily Pantoprazole 20 mg - 1 tablet daily  Furosemide 20 mg- 1 tablet daily  Gabapentin 300 mg- 1 tablet twice daily Glipizide 5 mg - 1 table twice daily  Latanoprost 0.005 % eye drops- 1 drop each eye daily due to receiving a 90 day supply on 01/19/2020 Metoprolol succinate 25 mg 0.5 tablet daily due to receiving a 90 day supply on 01/31/2020. Basaglar- Inject 10 units [place] daily due to receiving a supply on 01/06/2020. Metoclopramide 10 mg- due to PRN Use Nitrofurantoin due to completing treatment.  OneTouch test strips and lancets due to having a supplies on hand but patient is not check blood sugars at this time.  Pantoprazole 40 mg- 1 tablet daily due to placing on hold. Patient will double up on Pantoprazole 25m until finished and then will refill this strength.  Trazodone 50 mg- 1 and 1/2 tablet nightly due to PRN use. Metformin 500 mg-  Patient states she still has an adequate supply from [facility].   Patient is due for next adherence delivery on: 03/20/2020.  Called patient and reviewed medications and coordinated delivery.  This delivery to include: Basaglar- Inject 10 units [place] daily  Farxiga 5 mg - 1 tablet daily Metfomin 500 mg- 1 tablet three times a day before meals Ondansetron HCL 4 mg- 1 tablet every 8 hours as needed for nausea  Patient declined the following medications  Albuterol Hfa- Inhale 2 puffs into lungs every 6 hours as needed Atorvastatin 40 mg - 1 tablet daily (bedtime)- 90 DS 01/19/20 Clopidogrel 75 mg - 1 tablet daily - 90 DS 02/17/20 Eliquis 5 mg- 1 tablet twice daily - 90 DS 02/17/20 Furosemide 20 mg- 1 tablet daily- 90 DS 01/19/20 Gabapentin 300 mg- 1 tablet twice daily - 90 DS 01/19/20 Gabapentin 100 mg - 1 tablet three times daily- taking 300 mg instead  Glipizide 5 mg - 1 table twice daily- 90 DS 01/19/20 Hydrocodone 5/325 - Finished course. Isosorbide Mononitrate 30 mg - 0.5 tablet daily (breakfast)- 90 DS 02/18/20 Latanoprost 0.005 % eye drops- 1 drop each eye daily- 75 DS on 03/15/20 Metoclopramide 10 mg - 1 tablet every 6 hours as needed for nausea/vomiting. Taking Zofran instead Metoprolol succinate 25 mg 0.5 tablet daily due to receiving a 90 day supply on 01/31/2020 Nitrofurantoin - Completed course OneTouch test strips and lancets due to having a supplies on hand but patient is not check blood sugars at this time. Pantoprazole 20 mg - 1 tablet daily- 90 DS on 01/19/20 Trazodone 50 mg- 1 and 1/2 tablet nightly due to PRN use.  Patient needs refills for Ondansetron HCL 4 mg, metformin, and Farxiga   Confirmed delivery date of 03/20/2020, advised patient that pharmacy will contact them the morning of delivery.  Patient states she has not been checking her blood sugars frequently but agreed to check for a week and follow up 03/24/20 to review readings. She checks her blood pressure occasionally  at the drug store but does not have any readings available. She states "readings are always good".   Follow-Up:  Coordination of Enhanced Pharmacy Services and Pharmacist Review   MDebbora Dus CPP notified  LMargaretmary Dys CStrawberryPharmacy Assistant [phone] I have reviewed the care management and care coordination activities outlined in this encounter and I am certifying that I agree with the content of this note.   Drug Therapy Problems identified: Patient received gabapentin 100 mg last month. She did  not open it as she is aware she was supposed to be on 300 mg only. I have removed the 100 mg dose from pharmacy profile. Patients medications are no longer synced. We will work together on [place]. We will send a 30 day supply of medications this month to sync with most of her meds which are due 04/20/20. Next month we will send another 30 day supply of meds needed to sync with the remaining meds due on 05/21/20. Highly recommend patient switch from vials to adherence packaging. CMA follow up next week to review BG readings. A1c uncontrolled.  [name], PharmD Clinical Pharmacist [facility] [phone]

## 2020-03-21 ENCOUNTER — Ambulatory Visit: Payer: Medicare Other | Attending: Internal Medicine

## 2020-03-21 DIAGNOSIS — Z23 Encounter for immunization: Secondary | ICD-10-CM

## 2020-03-21 NOTE — Progress Notes (Signed)
   Covid-19 Vaccination Clinic  Name:  LAN ENTSMINGER    MRN: [id number] DOB: Dec 28, 1945  03/21/2020  Ms. Groom was observed post Covid-19 immunization for 15 minutes without incident. She was provided with Vaccine Information Sheet and instruction to access the V-Safe system.   Ms. Matherly was instructed to call 911 with any severe reactions post vaccine: Marland Kitchen Difficulty breathing  . Swelling of face and throat  . A fast heartbeat  . A bad rash all over body  . Dizziness and weakness   Immunizations Administered    Name Date Dose VIS Date Route   Moderna COVID-19 Vaccine 03/21/2020  3:26 PM 0.5 mL 12/29/2019 Intramuscular   Manufacturer: Levan Hurst   Lot: [id number]   [place]: 25498-264-15

## 2020-03-23 DIAGNOSIS — H26493 Other secondary cataract, bilateral: Secondary | ICD-10-CM | POA: Diagnosis not present

## 2020-03-23 DIAGNOSIS — E113293 Type 2 diabetes mellitus with mild nonproliferative diabetic retinopathy without macular edema, bilateral: Secondary | ICD-10-CM | POA: Diagnosis not present

## 2020-03-23 DIAGNOSIS — H401233 Low-tension glaucoma, bilateral, severe stage: Secondary | ICD-10-CM | POA: Diagnosis not present

## 2020-03-23 DIAGNOSIS — H35372 Puckering of macula, left eye: Secondary | ICD-10-CM | POA: Diagnosis not present

## 2020-03-23 DIAGNOSIS — Z961 Presence of intraocular lens: Secondary | ICD-10-CM | POA: Diagnosis not present

## 2020-03-23 LAB — HM DIABETES EYE EXAM

## 2020-03-24 ENCOUNTER — Encounter: Payer: Self-pay | Admitting: Family Medicine

## 2020-03-24 ENCOUNTER — Telehealth: Payer: Self-pay

## 2020-03-24 DIAGNOSIS — H401233 Low-tension glaucoma, bilateral, severe stage: Secondary | ICD-10-CM | POA: Insufficient documentation

## 2020-03-24 NOTE — Chronic Care Management (AMB) (Addendum)
Chronic Care Management Pharmacy Assistant   Name: Maria Fox  MRN: [id number] DOB: 09-14-1945  Reason for Encounter: Diabetes Medication Review  Patient Questions:  1.  Have you seen any other providers since your last visit? No  2.  Any changes in your medicines or health? No  PCP : [name], MD  Allergies:   Allergies  Allergen Reactions   Bee Venom     Throat swelling   Mushroom Extract Complex Anaphylaxis   Penicillins Anaphylaxis, Hives and Swelling    Has patient had a PCN reaction causing immediate rash, facial/tongue/throat swelling, SOB or lightheadedness with hypotension: Yes Has patient had a PCN reaction causing severe rash involving mucus membranes or skin necrosis: Yes Has patient had a PCN reaction that required hospitalization Yes Has patient had a PCN reaction occurring within the last 10 years: No If all of the above answers are "NO", then may proceed with Cephalosporin use.    Codeine Nausea And Vomiting   Sulfa Antibiotics Nausea And Vomiting   Iohexol    Erythromycin Base Rash    Medications: Outpatient Encounter Medications as of 03/24/2020  Medication Sig   acetaminophen (TYLENOL) 500 MG tablet Take 500 mg by mouth as needed.   albuterol (PROAIR HFA) 108 (90 Base) MCG/ACT inhaler Inhale 2 puffs into the lungs every 6 (six) hours as needed for wheezing or shortness of breath.   apixaban (ELIQUIS) 5 MG TABS tablet Take 1 tablet (5 mg total) by mouth 2 (two) times daily.   atorvastatin (LIPITOR) 40 MG tablet TAKE 1 TABLET BY MOUTH EVERYDAY AT BEDTIME   Blood Glucose Monitoring Suppl (ONE TOUCH ULTRA 2) w/Device KIT Use as instructed to check blood sugar 2 times daily as needed.   clopidogrel (PLAVIX) 75 MG tablet Take 1 tablet (75 mg total) by mouth daily.   Cyanocobalamin (VITAMIN B12) 500 MCG TABS Take 2 tablets by mouth daily.   dapagliflozin propanediol (FARXIGA) 5 MG TABS tablet Take 1 tablet (5 mg total) by mouth daily before  breakfast.   docusate sodium (COLACE) 100 MG capsule Take 1 capsule (100 mg total) by mouth daily.   furosemide (LASIX) 20 MG tablet Take 1 tablet (20 mg total) by mouth daily.   gabapentin (NEURONTIN) 300 MG capsule Take 1 capsule (300 mg total) by mouth 2 (two) times daily.   glipiZIDE (GLUCOTROL) 5 MG tablet TAKE 1 TABLET (5 MG TOTAL) BY MOUTH 2 (TWO) TIMES DAILY BEFORE A MEAL.   glucose blood (ONETOUCH ULTRA) test strip Use as instructed to check blood sugar 2 times daily as needed   HYDROcodone-acetaminophen (NORCO/VICODIN) 5-325 MG tablet Take 1 tablet by mouth every 6 (six) hours as needed (breakthrough pain).   insulin glargine (LANTUS SOLOSTAR) 100 UNIT/ML Solostar Pen Inject 10 Units into the skin daily.   isosorbide mononitrate (IMDUR) 30 MG 24 hr tablet Take 0.5 tablets (15 mg total) by mouth daily.   latanoprost (XALATAN) 0.005 % ophthalmic solution Place 1 drop into both eyes at bedtime.    metFORMIN (GLUCOPHAGE) 500 MG tablet Take 1 tablet (500 mg total) by mouth 3 (three) times daily before meals.   metoCLOPramide (REGLAN) 10 MG tablet Take 1 tablet (10 mg total) by mouth every 6 (six) hours as needed for nausea or vomiting.   metoprolol succinate (TOPROL XL) 25 MG 24 hr tablet Take 0.5 tablets (12.5 mg total) by mouth daily.   nitrofurantoin, macrocrystal-monohydrate, (MACROBID) 100 MG capsule Take 1 capsule (100 mg total) by mouth  2 (two) times daily.   NON FORMULARY Antifungal cream from [facility] as needed   ondansetron (ZOFRAN) 4 MG tablet Take 1 tablet (4 mg total) by mouth every 8 (eight) hours as needed for nausea or vomiting.   ondansetron (ZOFRAN-ODT) 4 MG disintegrating tablet Take 4 mg by mouth every 6 (six) hours as needed for nausea or vomiting.   OneTouch Delica Lancets 09W MISC Use as directed to check blood sugar 2 times daily as needed   pantoprazole (PROTONIX) 40 MG tablet Take 1 tablet (40 mg total) by mouth 2 (two) times daily.   traZODone (DESYREL)  50 MG tablet Take 1.5 tablets (75 mg total) by mouth at bedtime as needed for sleep.   No facility-administered encounter medications on file as of 03/24/2020.    Current Diagnosis: Patient Active Problem List   Diagnosis Date Noted   Dysuria 11/08/2019   Right sided temporal headache 08/06/2019   Atrial flutter with rapid ventricular response ([name])    Acute deep vein thrombosis (DVT) of left tibial vein ([place]) 07/11/2019   Acute cholecystitis 07/07/2019   Pain of right breast 03/15/2019   Health maintenance examination 12/11/2018   Type 2 diabetes mellitus with severe nonproliferative diabetic retinopathy with macular edema, bilateral ([place]) 07/16/2018   Coronary artery disease of native artery of native heart with stable angina pectoris ([name])    Effort angina ([place]) 11/12/2017   Abnormal stress ECG    Dyspnea on exertion 11/04/2017   GERD (gastroesophageal reflux disease) 08/15/2017   Trigger finger 06/09/2017   Nocturnal leg movements 03/25/2017   Osteopenia 02/01/2017   Advanced care planning/counseling discussion 01/02/2017   Stage 3 chronic kidney disease due to type 2 diabetes mellitus ([place]) 08/26/2016   Insomnia 08/26/2016   Vitamin B12 deficiency 11/10/2015   General weakness 08/04/2015   Epistaxis 07/07/2015   Fatty liver disease, nonalcoholic 11/91/4782   Chronic diastolic CHF (congestive heart failure) ([place]) 08/27/2014   Positive hepatitis C antibody test 08/27/2014   Iron deficiency 08/27/2014   NSVT (nonsustained ventricular tachycardia) ([name])    Nausea without vomiting    Autonomic orthostatic hypotension 07/11/2014   S/P CABG x 3 07/08/2014   History of cerebrovascular accident (CVA) in adulthood 07/08/2014   DM (diabetes mellitus), type 2, uncontrolled w/neurologic complication ([place]) 95/62/1308   Itching 03/16/2012   Rash 03/16/2012   Medicare annual wellness visit, initial 10/28/2011   Hyperlipidemia associated with type 2 diabetes mellitus ([place])     Idiopathic angioedema 06/23/2011   Vitiligo 06/23/2011   Dermatitis 06/23/2011   MDD (major depressive disorder), recurrent severe, without psychosis ([place]) 05/08/2006   Hypertension, essential 05/08/2006   MENOPAUSAL SYNDROME 05/08/2006   Maria Fox was contacted to follow up on blood glucose readings. States she has not been monitoring her blood sugar but agreed to a follow up call on Monday 03/27/20. Patient also interested in adherence packaging for her medications. Explained how packaging worked and how it could benefit her. She is going to get all of her medications together so when I follow up on Monday we can make a plan for medication sync.   Follow-Up:  Comptroller and Pharmacist Review   [name], CPP notified  Margaretmary Dys, [place] Pharmacy Assistant [phone]

## 2020-03-27 ENCOUNTER — Emergency Department (HOSPITAL_COMMUNITY): Payer: Medicare Other

## 2020-03-27 ENCOUNTER — Encounter (HOSPITAL_COMMUNITY): Payer: Self-pay | Admitting: *Deleted

## 2020-03-27 ENCOUNTER — Emergency Department (HOSPITAL_COMMUNITY)
Admission: EM | Admit: 2020-03-27 | Discharge: 2020-03-28 | Disposition: A | Discharge status: Home / Self Care | Payer: Medicare Other | Attending: Emergency Medicine | Admitting: Emergency Medicine

## 2020-03-27 ENCOUNTER — Other Ambulatory Visit: Payer: Self-pay

## 2020-03-27 DIAGNOSIS — E1122 Type 2 diabetes mellitus with diabetic chronic kidney disease: Secondary | ICD-10-CM | POA: Insufficient documentation

## 2020-03-27 DIAGNOSIS — Z86718 Personal history of other venous thrombosis and embolism: Secondary | ICD-10-CM | POA: Insufficient documentation

## 2020-03-27 DIAGNOSIS — N182 Chronic kidney disease, stage 2 (mild): Secondary | ICD-10-CM | POA: Diagnosis not present

## 2020-03-27 DIAGNOSIS — Z7984 Long term (current) use of oral hypoglycemic drugs: Secondary | ICD-10-CM | POA: Diagnosis not present

## 2020-03-27 DIAGNOSIS — I5032 Chronic diastolic (congestive) heart failure: Secondary | ICD-10-CM | POA: Insufficient documentation

## 2020-03-27 DIAGNOSIS — U071 COVID-19: Secondary | ICD-10-CM | POA: Diagnosis not present

## 2020-03-27 DIAGNOSIS — N390 Urinary tract infection, site not specified: Secondary | ICD-10-CM | POA: Diagnosis not present

## 2020-03-27 DIAGNOSIS — R112 Nausea with vomiting, unspecified: Secondary | ICD-10-CM | POA: Insufficient documentation

## 2020-03-27 DIAGNOSIS — R079 Chest pain, unspecified: Secondary | ICD-10-CM | POA: Diagnosis not present

## 2020-03-27 DIAGNOSIS — Z794 Long term (current) use of insulin: Secondary | ICD-10-CM | POA: Insufficient documentation

## 2020-03-27 DIAGNOSIS — R109 Unspecified abdominal pain: Secondary | ICD-10-CM | POA: Diagnosis not present

## 2020-03-27 DIAGNOSIS — Z7902 Long term (current) use of antithrombotics/antiplatelets: Secondary | ICD-10-CM | POA: Insufficient documentation

## 2020-03-27 DIAGNOSIS — R42 Dizziness and giddiness: Secondary | ICD-10-CM | POA: Diagnosis not present

## 2020-03-27 DIAGNOSIS — I130 Hypertensive heart and chronic kidney disease with heart failure and stage 1 through stage 4 chronic kidney disease, or unspecified chronic kidney disease: Secondary | ICD-10-CM | POA: Insufficient documentation

## 2020-03-27 DIAGNOSIS — R1110 Vomiting, unspecified: Secondary | ICD-10-CM | POA: Diagnosis not present

## 2020-03-27 DIAGNOSIS — Z8673 Personal history of transient ischemic attack (TIA), and cerebral infarction without residual deficits: Secondary | ICD-10-CM | POA: Insufficient documentation

## 2020-03-27 DIAGNOSIS — Z951 Presence of aortocoronary bypass graft: Secondary | ICD-10-CM | POA: Diagnosis not present

## 2020-03-27 DIAGNOSIS — I2510 Atherosclerotic heart disease of native coronary artery without angina pectoris: Secondary | ICD-10-CM | POA: Insufficient documentation

## 2020-03-27 DIAGNOSIS — J9811 Atelectasis: Secondary | ICD-10-CM | POA: Diagnosis not present

## 2020-03-27 DIAGNOSIS — R197 Diarrhea, unspecified: Secondary | ICD-10-CM | POA: Diagnosis not present

## 2020-03-27 DIAGNOSIS — E113419 Type 2 diabetes mellitus with severe nonproliferative diabetic retinopathy with macular edema, unspecified eye: Secondary | ICD-10-CM | POA: Diagnosis not present

## 2020-03-27 DIAGNOSIS — I10 Essential (primary) hypertension: Secondary | ICD-10-CM | POA: Diagnosis not present

## 2020-03-27 DIAGNOSIS — K449 Diaphragmatic hernia without obstruction or gangrene: Secondary | ICD-10-CM | POA: Diagnosis not present

## 2020-03-27 DIAGNOSIS — R1084 Generalized abdominal pain: Secondary | ICD-10-CM | POA: Diagnosis present

## 2020-03-27 DIAGNOSIS — K5730 Diverticulosis of large intestine without perforation or abscess without bleeding: Secondary | ICD-10-CM | POA: Diagnosis not present

## 2020-03-27 DIAGNOSIS — R110 Nausea: Secondary | ICD-10-CM | POA: Diagnosis not present

## 2020-03-27 DIAGNOSIS — Z79899 Other long term (current) drug therapy: Secondary | ICD-10-CM | POA: Diagnosis not present

## 2020-03-27 LAB — URINALYSIS, ROUTINE W REFLEX MICROSCOPIC
Bilirubin Urine: NEGATIVE
Glucose, UA: 50 mg/dL — AB
Ketones, ur: 20 mg/dL — AB
Nitrite: POSITIVE — AB
Protein, ur: 30 mg/dL — AB
RBC / HPF: >50 RBC/hpf — ABNORMAL HIGH (ref 0–5)
Specific Gravity, Urine: 1.02 (ref 1.005–1.030)
WBC, UA: >50 WBC/hpf — ABNORMAL HIGH (ref 0–5)
pH: 5 (ref 5.0–8.0)

## 2020-03-27 LAB — COMPREHENSIVE METABOLIC PANEL
ALT: 28 U/L (ref 0–44)
AST: 48 U/L — ABNORMAL HIGH (ref 15–41)
Albumin: 3.4 g/dL — ABNORMAL LOW (ref 3.5–5.0)
Alkaline Phosphatase: 81 U/L (ref 38–126)
Anion gap: 14 (ref 5–15)
BUN: 21 mg/dL (ref 8–23)
CO2: 24 mmol/L (ref 22–32)
Calcium: 9 mg/dL (ref 8.9–10.3)
Chloride: 99 mmol/L (ref 98–111)
Creatinine, Ser: 1.44 mg/dL — ABNORMAL HIGH (ref 0.44–1.00)
GFR, Estimated: 37 mL/min — ABNORMAL LOW (ref >60)
Glucose, Bld: 262 mg/dL — ABNORMAL HIGH (ref 70–99)
Potassium: 4 mmol/L (ref 3.5–5.1)
Sodium: 137 mmol/L (ref 135–145)
Total Bilirubin: 0.8 mg/dL (ref 0.3–1.2)
Total Protein: 6.9 g/dL (ref 6.5–8.1)

## 2020-03-27 LAB — CBC
HCT: 43.9 % (ref 36.0–46.0)
Hemoglobin: 14.7 g/dL (ref 12.0–15.0)
MCH: 30 pg (ref 26.0–34.0)
MCHC: 33.5 g/dL (ref 30.0–36.0)
MCV: 89.6 fL (ref 80.0–100.0)
Platelets: 188 10*3/uL (ref 150–400)
RBC: 4.9 MIL/uL (ref 3.87–5.11)
RDW: 13.7 % (ref 11.5–15.5)
WBC: 4.7 10*3/uL (ref 4.0–10.5)
nRBC: 0 % (ref 0.0–0.2)

## 2020-03-27 LAB — LACTIC ACID, PLASMA
Lactic Acid, Venous: 1.1 mmol/L (ref 0.5–1.9)
Lactic Acid, Venous: 1.3 mmol/L (ref 0.5–1.9)

## 2020-03-27 LAB — LIPASE, BLOOD: Lipase: 36 U/L (ref 11–51)

## 2020-03-27 LAB — TROPONIN I (HIGH SENSITIVITY)
Troponin I (High Sensitivity): 18 ng/L — ABNORMAL HIGH (ref <18)
Troponin I (High Sensitivity): 18 ng/L — ABNORMAL HIGH (ref <18)

## 2020-03-27 LAB — POC SARS CORONAVIRUS 2 AG -  ED: SARS Coronavirus 2 Ag: POSITIVE — AB

## 2020-03-27 MED ORDER — ACETAMINOPHEN 325 MG PO TABS
650.0000 mg | ORAL_TABLET | Freq: Once | ORAL | Status: AC
  Administered 2020-03-27: 650 mg via ORAL
  Filled 2020-03-27: qty 2

## 2020-03-27 MED ORDER — CEPHALEXIN 500 MG PO CAPS: 500.0000 mg | ORAL_CAPSULE | Freq: Two times a day (BID) | ORAL | 0 refills | Status: DC

## 2020-03-27 MED ORDER — SODIUM CHLORIDE 0.9 % IV BOLUS
500.0000 mL | Freq: Once | INTRAVENOUS | Status: AC
  Administered 2020-03-27: 500 mL via INTRAVENOUS

## 2020-03-27 MED ORDER — SODIUM CHLORIDE 0.9 % IV SOLN
1.0000 g | Freq: Once | INTRAVENOUS | Status: AC
  Administered 2020-03-27: 1 g via INTRAVENOUS
  Filled 2020-03-27: qty 10

## 2020-03-27 MED ORDER — ONDANSETRON 4 MG PO TBDP: 4.0000 mg | ORAL_TABLET | Freq: Three times a day (TID) | ORAL | 0 refills | Status: DC | PRN

## 2020-03-27 MED ORDER — ONDANSETRON HCL 4 MG/2ML IJ SOLN
4.0000 mg | Freq: Once | INTRAMUSCULAR | Status: AC
  Administered 2020-03-27: 4 mg via INTRAVENOUS
  Filled 2020-03-27: qty 2

## 2020-03-27 NOTE — ED Notes (Signed)
Pt tolerated fluids well.  
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
## 2020-03-27 NOTE — ED Triage Notes (Signed)
Pt has multiple complaints. Reports feeling bad with dizziness, abd pain, chest pain and n/v/d x 4-5 days. Reports decreased urine output and pain with urination.

## 2020-03-27 NOTE — ED Notes (Signed)
Pt O2 sats remained 92%-95% while ambulating, pt complained of being dizzy

## 2020-03-27 NOTE — ED Notes (Signed)
Roommate [phone] [name] wants an update

## 2020-03-28 ENCOUNTER — Encounter: Payer: Self-pay | Admitting: Family Medicine

## 2020-03-28 ENCOUNTER — Other Ambulatory Visit (HOSPITAL_COMMUNITY): Payer: Self-pay | Admitting: Internal Medicine

## 2020-03-28 DIAGNOSIS — U071 COVID-19: Secondary | ICD-10-CM

## 2020-03-28 DIAGNOSIS — I10 Essential (primary) hypertension: Secondary | ICD-10-CM

## 2020-03-28 DIAGNOSIS — N183 Chronic kidney disease, stage 3 unspecified: Secondary | ICD-10-CM

## 2020-03-28 DIAGNOSIS — E113413 Type 2 diabetes mellitus with severe nonproliferative diabetic retinopathy with macular edema, bilateral: Secondary | ICD-10-CM

## 2020-03-28 DIAGNOSIS — E1122 Type 2 diabetes mellitus with diabetic chronic kidney disease: Secondary | ICD-10-CM

## 2020-03-28 DIAGNOSIS — Z8673 Personal history of transient ischemic attack (TIA), and cerebral infarction without residual deficits: Secondary | ICD-10-CM

## 2020-03-28 NOTE — ED Notes (Signed)
Patient verbalizes understanding of discharge instructions. Prescriptions reviewed. Opportunity for questioning and answers were provided. Armband removed by staff, pt discharged from ED ambulatory.

## 2020-03-28 NOTE — Progress Notes (Signed)
I connected by phone with Maria Fox on 03/28/2020 at 3:40 PM to discuss the potential use of a new treatment for mild to moderate COVID-19 viral infection in non-hospitalized patients.  This patient is a 75 y.o. female that meets the FDA criteria for Emergency Use Authorization of COVID monoclonal antibody sotrovimab.  Has a (+) direct SARS-CoV-2 viral test result  Has mild or moderate COVID-19   Is NOT hospitalized due to COVID-19  Is within 10 days of symptom onset  Has at least one of the high risk factor(s) for progression to severe COVID-19 and/or hospitalization as defined in EUA.  Specific high risk criteria : Older age (>/= 77 yo), CAD, DM2, CVA, CKD   I have spoken and communicated the following to the patient or parent/caregiver regarding COVID monoclonal antibody treatment:  1. FDA has authorized the emergency use for the treatment of mild to moderate COVID-19 in adults and pediatric patients with positive results of direct SARS-CoV-2 viral testing who are 37 years of age and older weighing at least 40 kg, and who are at high risk for progressing to severe COVID-19 and/or hospitalization.  2. The significant known and potential risks and benefits of COVID monoclonal antibody, and the extent to which such potential risks and benefits are unknown.  3. Information on available alternative treatments and the risks and benefits of those alternatives, including clinical trials.  4. Patients treated with COVID monoclonal antibody should continue to self-isolate and use infection control measures (e.g., wear mask, isolate, social distance, avoid sharing personal items, clean and disinfect "high touch" surfaces, and frequent handwashing) according to CDC guidelines.   5. The patient or parent/caregiver has the option to accept or refuse COVID monoclonal antibody treatment.  After reviewing this information with the patient, the patient has agreed to receive one of the available covid  19 monoclonal antibodies and will be provided an appropriate fact sheet prior to infusion.   Symptoms onset 1/13. MAB infusion scheduled 1/19 at 1330.   Alan Ripper, [place]

## 2020-03-29 ENCOUNTER — Ambulatory Visit (HOSPITAL_COMMUNITY)
Admission: RE | Admit: 2020-03-29 | Discharge: 2020-03-29 | Disposition: A | Discharge status: Home / Self Care | Payer: Medicare Other | Source: Ambulatory Visit | Attending: Pulmonary Disease | Admitting: Pulmonary Disease

## 2020-03-29 DIAGNOSIS — Z8673 Personal history of transient ischemic attack (TIA), and cerebral infarction without residual deficits: Secondary | ICD-10-CM | POA: Insufficient documentation

## 2020-03-29 DIAGNOSIS — E113413 Type 2 diabetes mellitus with severe nonproliferative diabetic retinopathy with macular edema, bilateral: Secondary | ICD-10-CM | POA: Diagnosis not present

## 2020-03-29 DIAGNOSIS — U071 COVID-19: Secondary | ICD-10-CM | POA: Insufficient documentation

## 2020-03-29 DIAGNOSIS — I10 Essential (primary) hypertension: Secondary | ICD-10-CM

## 2020-03-29 DIAGNOSIS — N183 Chronic kidney disease, stage 3 unspecified: Secondary | ICD-10-CM | POA: Diagnosis not present

## 2020-03-29 DIAGNOSIS — I130 Hypertensive heart and chronic kidney disease with heart failure and stage 1 through stage 4 chronic kidney disease, or unspecified chronic kidney disease: Secondary | ICD-10-CM | POA: Insufficient documentation

## 2020-03-29 DIAGNOSIS — E1122 Type 2 diabetes mellitus with diabetic chronic kidney disease: Secondary | ICD-10-CM | POA: Diagnosis not present

## 2020-03-29 MED ORDER — SOTROVIMAB 500 MG/8ML IV SOLN
500.0000 mg | Freq: Once | INTRAVENOUS | Status: AC
  Administered 2020-03-29: 500 mg via INTRAVENOUS

## 2020-03-29 MED ORDER — SODIUM CHLORIDE 0.9 % IV SOLN: INTRAVENOUS | Status: DC | PRN

## 2020-03-29 MED ORDER — SODIUM CHLORIDE 0.9 % IV SOLN: Freq: Once | INTRAVENOUS | Status: AC

## 2020-03-29 MED ORDER — METHYLPREDNISOLONE SODIUM SUCC 125 MG IJ SOLR: 125.0000 mg | Freq: Once | INTRAMUSCULAR | Status: DC | PRN

## 2020-03-29 MED ORDER — DIPHENHYDRAMINE HCL 50 MG/ML IJ SOLN: 50.0000 mg | Freq: Once | INTRAMUSCULAR | Status: DC | PRN

## 2020-03-29 MED ORDER — FAMOTIDINE IN NACL 20-0.9 MG/50ML-% IV SOLN: 20.0000 mg | Freq: Once | INTRAVENOUS | Status: DC | PRN

## 2020-03-29 MED ORDER — ALBUTEROL SULFATE HFA 108 (90 BASE) MCG/ACT IN AERS: 2.0000 | INHALATION_SPRAY | Freq: Once | RESPIRATORY_TRACT | Status: DC | PRN

## 2020-03-29 MED ORDER — EPINEPHRINE 0.3 MG/0.3ML IJ SOAJ: 0.3000 mg | Freq: Once | INTRAMUSCULAR | Status: DC | PRN

## 2020-03-29 NOTE — Progress Notes (Signed)
Diagnosis: COVID-19  Physician: Dr. Patrick Wright  Procedure: [facility]fusion [facility] Med: Sotrovimab infusion - Provided patient with sotrovimab fact sheet for patients, parents, and caregivers prior to infusion.   Complications: No immediate complications noted  Discharge: Discharged home    

## 2020-03-29 NOTE — Discharge Instructions (Signed)

## 2020-03-29 NOTE — Progress Notes (Signed)
Patient reviewed Fact Sheet for Patients, Parents, and Caregivers for Emergency Use Authorization (EUA) of sotrovimab for the Treatment of Coronavirus. Patient also reviewed and is agreeable to the estimated cost of treatment. Patient is agreeable to proceed.   

## 2020-03-30 LAB — URINE CULTURE: Culture: >=100000 — AB

## 2020-03-31 ENCOUNTER — Telehealth: Payer: Self-pay

## 2020-03-31 NOTE — Chronic Care Management (AMB) (Signed)
Chronic Care Management Pharmacy Assistant   Name: Maria Fox  MRN: [id number] DOB: [birth year]  Reason for Encounter: Medication Review - Conversion to packaging  Patient Questions:  1.  Have you seen any other providers since your last visit? Yes 03/27/20- ED visit for Covid-19   2.  Any changes in your medicines or health? Yes 03/27/20 ED provider started patient on short course of Cephalexin 500 mg and Ondansetron 4 mg.      PCP : [name], MD  Allergies:   Allergies  Allergen Reactions  . Bee Venom     Throat swelling  . Mushroom Extract Complex Anaphylaxis  . Penicillins Anaphylaxis, Hives and Swelling    Has patient had a PCN reaction causing immediate rash, facial/tongue/throat swelling, SOB or lightheadedness with hypotension: Yes Has patient had a PCN reaction causing severe rash involving mucus membranes or skin necrosis: Yes Has patient had a PCN reaction that required hospitalization Yes Has patient had a PCN reaction occurring within the last 10 years: No If all of the above answers are "NO", then may proceed with Cephalosporin use.   . Codeine Nausea And Vomiting  . Sulfa Antibiotics Nausea And Vomiting  . Iohexol   . Erythromycin Base Rash    Medications: Outpatient Encounter Medications as of 03/31/2020  Medication Sig  . acetaminophen (TYLENOL) 500 MG tablet Take 500 mg by mouth as needed.  Marland Kitchen albuterol (PROAIR HFA) 108 (90 Base) MCG/ACT inhaler Inhale 2 puffs into the lungs every 6 (six) hours as needed for wheezing or shortness of breath.  Marland Kitchen apixaban (ELIQUIS) 5 MG TABS tablet Take 1 tablet (5 mg total) by mouth 2 (two) times daily.  Marland Kitchen atorvastatin (LIPITOR) 40 MG tablet TAKE 1 TABLET BY MOUTH EVERYDAY AT BEDTIME  . Blood Glucose Monitoring Suppl (ONE TOUCH ULTRA 2) w/Device KIT Use as instructed to check blood sugar 2 times daily as needed.  . cephALEXin (KEFLEX) 500 MG capsule Take 1 capsule (500 mg total) by mouth 2 (two) times daily.  .  clopidogrel (PLAVIX) 75 MG tablet Take 1 tablet (75 mg total) by mouth daily.  . Cyanocobalamin (VITAMIN B12) 500 MCG TABS Take 2 tablets by mouth daily.  . dapagliflozin propanediol (FARXIGA) 5 MG TABS tablet Take 1 tablet (5 mg total) by mouth daily before breakfast.  . docusate sodium (COLACE) 100 MG capsule Take 1 capsule (100 mg total) by mouth daily.  . furosemide (LASIX) 20 MG tablet Take 1 tablet (20 mg total) by mouth daily.  Marland Kitchen gabapentin (NEURONTIN) 300 MG capsule Take 1 capsule (300 mg total) by mouth 2 (two) times daily.  Marland Kitchen glipiZIDE (GLUCOTROL) 5 MG tablet TAKE 1 TABLET (5 MG TOTAL) BY MOUTH 2 (TWO) TIMES DAILY BEFORE A MEAL.  Marland Kitchen glucose blood (ONETOUCH ULTRA) test strip Use as instructed to check blood sugar 2 times daily as needed  . HYDROcodone-acetaminophen (NORCO/VICODIN) 5-325 MG tablet Take 1 tablet by mouth every 6 (six) hours as needed (breakthrough pain).  . insulin glargine (LANTUS SOLOSTAR) 100 UNIT/ML Solostar Pen Inject 10 Units into the skin daily.  . isosorbide mononitrate (IMDUR) 30 MG 24 hr tablet Take 0.5 tablets (15 mg total) by mouth daily.  Marland Kitchen latanoprost (XALATAN) 0.005 % ophthalmic solution Place 1 drop into both eyes at bedtime.   . metFORMIN (GLUCOPHAGE) 500 MG tablet Take 1 tablet (500 mg total) by mouth 3 (three) times daily before meals.  . metoCLOPramide (REGLAN) 10 MG tablet Take 1 tablet (10 mg total)  by mouth every 6 (six) hours as needed for nausea or vomiting.  . metoprolol succinate (TOPROL XL) 25 MG 24 hr tablet Take 0.5 tablets (12.5 mg total) by mouth daily.  . nitrofurantoin, macrocrystal-monohydrate, (MACROBID) 100 MG capsule Take 1 capsule (100 mg total) by mouth 2 (two) times daily.  . NON FORMULARY Antifungal cream from [place] as needed  . ondansetron (ZOFRAN ODT) 4 MG disintegrating tablet Take 1 tablet (4 mg total) by mouth every 8 (eight) hours as needed for nausea or vomiting.  . ondansetron (ZOFRAN) 4 MG tablet Take 1 tablet  (4 mg total) by mouth every 8 (eight) hours as needed for nausea or vomiting.  . ondansetron (ZOFRAN-ODT) 4 MG disintegrating tablet Take 4 mg by mouth every 6 (six) hours as needed for nausea or vomiting.  Glory Rosebush Delica Lancets 96G MISC Use as directed to check blood sugar 2 times daily as needed  . pantoprazole (PROTONIX) 40 MG tablet Take 1 tablet (40 mg total) by mouth 2 (two) times daily.  . traZODone (DESYREL) 50 MG tablet Take 1.5 tablets (75 mg total) by mouth at bedtime as needed for sleep.   No facility-administered encounter medications on file as of 03/31/2020.    Current Diagnosis: Patient Active Problem List   Diagnosis Date Noted  . Low-tension glaucoma, bilateral, severe stage 03/24/2020  . Dysuria 11/08/2019  . Right sided temporal headache 08/06/2019  . Atrial flutter with rapid ventricular response ([place])   . Acute deep vein thrombosis (DVT) of left tibial vein ([place]) 07/11/2019  . Acute cholecystitis 07/07/2019  . Pain of right breast 03/15/2019  . Health maintenance examination 12/11/2018  . Type 2 diabetes mellitus with severe nonproliferative diabetic retinopathy with macular edema, bilateral ([place]) 07/16/2018  . Coronary artery disease of native artery of native heart with stable angina pectoris ([place])   . Effort angina ([place]) 11/12/2017  . Abnormal stress ECG   . Dyspnea on exertion 11/04/2017  . GERD (gastroesophageal reflux disease) 08/15/2017  . Trigger finger 06/09/2017  . Nocturnal leg movements 03/25/2017  . Osteopenia 02/01/2017  . Advanced care planning/counseling discussion 01/02/2017  . Stage 3 chronic kidney disease due to type 2 diabetes mellitus ([place]) 08/26/2016  . Insomnia 08/26/2016  . Vitamin B12 deficiency 11/10/2015  . General weakness 08/04/2015  . Epistaxis 07/07/2015  . Fatty liver disease, nonalcoholic 83/66/2947  . Chronic diastolic CHF (congestive heart failure) ([place]) 08/27/2014  . Positive hepatitis C antibody test 08/27/2014  .  Iron deficiency 08/27/2014  . NSVT (nonsustained ventricular tachycardia) ([place])   . Nausea without vomiting   . Autonomic orthostatic hypotension 07/11/2014  . S/P CABG x 3 07/08/2014  . History of cerebrovascular accident (CVA) in adulthood 07/08/2014  . DM (diabetes mellitus), type 2, uncontrolled w/neurologic complication ([place]) 65/46/5035  . Itching 03/16/2012  . Rash 03/16/2012  . Medicare annual wellness visit, initial 10/28/2011  . Hyperlipidemia associated with type 2 diabetes mellitus ([place])   . Idiopathic angioedema 06/23/2011  . Vitiligo 06/23/2011  . Dermatitis 06/23/2011  . MDD (major depressive disorder), recurrent severe, without psychosis ([place]) 05/08/2006  . Hypertension, essential 05/08/2006  . MENOPAUSAL SYNDROME 05/08/2006   Contacted patient due to her interest in converting to packaging for her prescriptions. She states she is still very sick from covid. She states she has had 2 infusions and hopes that she will begin feeling better soon. She states that she does not feel like eating or drinking anything. Encouraged fluid intake. Advised patient I would call  back next week to review medications that needed to be filled and discuss packaging.   Follow-Up:  Comptroller and Pharmacist Review   [name], CPP notified  Margaretmary Dys, [place] Pharmacy Assistant [phone]

## 2020-04-03 ENCOUNTER — Telehealth: Payer: Self-pay

## 2020-04-03 NOTE — Telephone Encounter (Signed)
Patient is calling in requesting an appointment, when asking what was going on she stated that she tested positive last week, and has been feeling awful with diarrhea and is unable to eat. Refused a virtual appointment, states she wants to see Dr.G in office.

## 2020-04-04 NOTE — Telephone Encounter (Signed)
Thank you. Agree with ER evaluation today. I never received yesterday's note but would have recommended in person evaluation yesterday with how bad she was feeling.

## 2020-04-04 NOTE — Telephone Encounter (Signed)
Pt called back in; pt is feeling awful; pt is trying to drink water and tomato juice; pt does not have appetite; pt has mild H/A; pt stopped on her own without instruction from a doctor the plavix and eliquis one wk ago; pt said she is not hungry and she cannot say why she stopped the plavix and eliquis. Pt has prod cough with clear phlegm; no SOB, no vomiting; pt has had diarrhea that looks red but pt is not sure if that is due to drinking tomato juice. Pt said she knows she needs fluids, pt feels dehydrated,no abd pain, pt has fever blisters on cracked upper and lower lips, pt cannot smell or taste; no S/T, runny nose and no head congestion. Pt cannot eat for 1 wk. Pt wants to know how to get IV fluids. Pt tested positive for covid on 03/27/20. I advised pt she would need to go to ED for eval and IV fluids if needed for dehydration. Pt said she will go to [facility] and pt has a way to get to ED so denied 911. Pt will go to [facility] ED now and pt will cb in one to two days with update on how she is feeling. Sending note to Dr [name].

## 2020-04-10 ENCOUNTER — Telehealth: Payer: Self-pay

## 2020-04-10 DIAGNOSIS — E1165 Type 2 diabetes mellitus with hyperglycemia: Secondary | ICD-10-CM

## 2020-04-10 DIAGNOSIS — IMO0002 Reserved for concepts with insufficient information to code with codable children: Secondary | ICD-10-CM

## 2020-04-10 DIAGNOSIS — E785 Hyperlipidemia, unspecified: Secondary | ICD-10-CM

## 2020-04-10 MED ORDER — ATORVASTATIN CALCIUM 40 MG PO TABS: ORAL_TABLET | ORAL | 0 refills | Status: DC

## 2020-04-10 MED ORDER — METFORMIN HCL 500 MG PO TABS: 500.0000 mg | ORAL_TABLET | Freq: Three times a day (TID) | ORAL | 0 refills | Status: DC

## 2020-04-10 MED ORDER — PANTOPRAZOLE SODIUM 40 MG PO TBEC: 40.0000 mg | DELAYED_RELEASE_TABLET | Freq: Two times a day (BID) | ORAL | 2 refills | Status: DC

## 2020-04-10 MED ORDER — FUROSEMIDE 20 MG PO TABS: 20.0000 mg | ORAL_TABLET | Freq: Every day | ORAL | 0 refills | Status: DC

## 2020-04-10 MED ORDER — ONETOUCH ULTRA 2 W/DEVICE KIT: PACK | 0 refills | Status: DC

## 2020-04-10 MED ORDER — DAPAGLIFLOZIN PROPANEDIOL 5 MG PO TABS: 5.0000 mg | ORAL_TABLET | Freq: Every day | ORAL | 2 refills | Status: DC

## 2020-04-10 MED ORDER — GLIPIZIDE 5 MG PO TABS: ORAL_TABLET | ORAL | 0 refills | Status: DC

## 2020-04-10 NOTE — Telephone Encounter (Signed)
E-scribed refills.  Plz schedule wellness, labs and cpe visits.

## 2020-04-10 NOTE — Telephone Encounter (Signed)
-----  Message from Michelle Adams, RPH sent at 04/10/2020 11:39 AM EST ----- Regarding: New Glucose Meter Can you also send a new prescription for the One Touch Ultra 2 Kit. Patient lost hers.   Thanks,  Michelle Adams, PharmD Clinical Pharmacist [facility] [place] [phone]    

## 2020-04-10 NOTE — Chronic Care Management (AMB) (Addendum)
Chronic Care Management Pharmacy Assistant   Name: ZULEYMA SCHARF  MRN: [id number] DOB: Apr 03, 1945  Reason for Encounter: Medication Review - Medication Adherence and Delivery Coordination  Patient Questions:  1.  Have you seen any other providers since your last visit? No  2.  Any changes in your medicines or health? Recent Covid-19 diagnosis. Seen in ED 03/27/20    PCP : [name], MD  Allergies:   Allergies  Allergen Reactions   Bee Venom     Throat swelling   Mushroom Extract Complex Anaphylaxis   Penicillins Anaphylaxis, Hives and Swelling    Has patient had a PCN reaction causing immediate rash, facial/tongue/throat swelling, SOB or lightheadedness with hypotension: Yes Has patient had a PCN reaction causing severe rash involving mucus membranes or skin necrosis: Yes Has patient had a PCN reaction that required hospitalization Yes Has patient had a PCN reaction occurring within the last 10 years: No If all of the above answers are "NO", then may proceed with Cephalosporin use.    Codeine Nausea And Vomiting   Sulfa Antibiotics Nausea And Vomiting   Iohexol    Erythromycin Base Rash    Medications: Outpatient Encounter Medications as of 04/10/2020  Medication Sig   acetaminophen (TYLENOL) 500 MG tablet Take 500 mg by mouth as needed.   albuterol (PROAIR HFA) 108 (90 Base) MCG/ACT inhaler Inhale 2 puffs into the lungs every 6 (six) hours as needed for wheezing or shortness of breath.   apixaban (ELIQUIS) 5 MG TABS tablet Take 1 tablet (5 mg total) by mouth 2 (two) times daily.   atorvastatin (LIPITOR) 40 MG tablet TAKE 1 TABLET BY MOUTH EVERYDAY AT BEDTIME   Blood Glucose Monitoring Suppl (ONE TOUCH ULTRA 2) w/Device KIT Use as instructed to check blood sugar 2 times daily as needed.   cephALEXin (KEFLEX) 500 MG capsule Take 1 capsule (500 mg total) by mouth 2 (two) times daily.   clopidogrel (PLAVIX) 75 MG tablet Take 1 tablet (75 mg total) by mouth daily.    Cyanocobalamin (VITAMIN B12) 500 MCG TABS Take 2 tablets by mouth daily.   dapagliflozin propanediol (FARXIGA) 5 MG TABS tablet Take 1 tablet (5 mg total) by mouth daily before breakfast.   docusate sodium (COLACE) 100 MG capsule Take 1 capsule (100 mg total) by mouth daily.   furosemide (LASIX) 20 MG tablet Take 1 tablet (20 mg total) by mouth daily.   gabapentin (NEURONTIN) 300 MG capsule Take 1 capsule (300 mg total) by mouth 2 (two) times daily.   glipiZIDE (GLUCOTROL) 5 MG tablet TAKE 1 TABLET (5 MG TOTAL) BY MOUTH 2 (TWO) TIMES DAILY BEFORE A MEAL.   glucose blood (ONETOUCH ULTRA) test strip Use as instructed to check blood sugar 2 times daily as needed   HYDROcodone-acetaminophen (NORCO/VICODIN) 5-325 MG tablet Take 1 tablet by mouth every 6 (six) hours as needed (breakthrough pain).   insulin glargine (LANTUS SOLOSTAR) 100 UNIT/ML Solostar Pen Inject 10 Units into the skin daily.   isosorbide mononitrate (IMDUR) 30 MG 24 hr tablet Take 0.5 tablets (15 mg total) by mouth daily.   latanoprost (XALATAN) 0.005 % ophthalmic solution Place 1 drop into both eyes at bedtime.    metFORMIN (GLUCOPHAGE) 500 MG tablet Take 1 tablet (500 mg total) by mouth 3 (three) times daily before meals.   metoCLOPramide (REGLAN) 10 MG tablet Take 1 tablet (10 mg total) by mouth every 6 (six) hours as needed for nausea or vomiting.   metoprolol succinate (  TOPROL XL) 25 MG 24 hr tablet Take 0.5 tablets (12.5 mg total) by mouth daily.   nitrofurantoin, macrocrystal-monohydrate, (MACROBID) 100 MG capsule Take 1 capsule (100 mg total) by mouth 2 (two) times daily.   NON FORMULARY Antifungal cream from [place] as needed   ondansetron (ZOFRAN ODT) 4 MG disintegrating tablet Take 1 tablet (4 mg total) by mouth every 8 (eight) hours as needed for nausea or vomiting.   ondansetron (ZOFRAN) 4 MG tablet Take 1 tablet (4 mg total) by mouth every 8 (eight) hours as needed for nausea or vomiting.   ondansetron  (ZOFRAN-ODT) 4 MG disintegrating tablet Take 4 mg by mouth every 6 (six) hours as needed for nausea or vomiting.   OneTouch Delica Lancets 92T MISC Use as directed to check blood sugar 2 times daily as needed   pantoprazole (PROTONIX) 40 MG tablet Take 1 tablet (40 mg total) by mouth 2 (two) times daily.   traZODone (DESYREL) 50 MG tablet Take 1.5 tablets (75 mg total) by mouth at bedtime as needed for sleep.   No facility-administered encounter medications on file as of 04/10/2020.    Current Diagnosis: Patient Active Problem List   Diagnosis Date Noted   Low-tension glaucoma, bilateral, severe stage 03/24/2020   Dysuria 11/08/2019   Right sided temporal headache 08/06/2019   Atrial flutter with rapid ventricular response ([name])    Acute deep vein thrombosis (DVT) of left tibial vein ([place]) 07/11/2019   Acute cholecystitis 07/07/2019   Pain of right breast 03/15/2019   Health maintenance examination 12/11/2018   Type 2 diabetes mellitus with severe nonproliferative diabetic retinopathy with macular edema, bilateral ([place]) 07/16/2018   Coronary artery disease of native artery of native heart with stable angina pectoris ([name])    Effort angina ([place]) 11/12/2017   Abnormal stress ECG    Dyspnea on exertion 11/04/2017   GERD (gastroesophageal reflux disease) 08/15/2017   Trigger finger 06/09/2017   Nocturnal leg movements 03/25/2017   Osteopenia 02/01/2017   Advanced care planning/counseling discussion 01/02/2017   Stage 3 chronic kidney disease due to type 2 diabetes mellitus ([place]) 08/26/2016   Insomnia 08/26/2016   Vitamin B12 deficiency 11/10/2015   General weakness 08/04/2015   Epistaxis 07/07/2015   Fatty liver disease, nonalcoholic 24/46/2863   Chronic diastolic CHF (congestive heart failure) ([place]) 08/27/2014   Positive hepatitis C antibody test 08/27/2014   Iron deficiency 08/27/2014   NSVT (nonsustained ventricular tachycardia) ([name])    Nausea without vomiting    Autonomic  orthostatic hypotension 07/11/2014   S/P CABG x 3 07/08/2014   History of cerebrovascular accident (CVA) in adulthood 07/08/2014   DM (diabetes mellitus), type 2, uncontrolled w/neurologic complication ([place]) 81/77/1165   Itching 03/16/2012   Rash 03/16/2012   Medicare annual wellness visit, initial 10/28/2011   Hyperlipidemia associated with type 2 diabetes mellitus ([place])    Idiopathic angioedema 06/23/2011   Vitiligo 06/23/2011   Dermatitis 06/23/2011   MDD (major depressive disorder), recurrent severe, without psychosis ([place]) 05/08/2006   Hypertension, essential 05/08/2006   MENOPAUSAL SYNDROME 05/08/2006   Reviewed chart for medication changes ahead of medication coordination call.  No OVs, Consults, or hospital visits since last care coordination call/Pharmacist visit.   BP Readings from Last 3 Encounters:  03/29/20 (!) 141/85  03/28/20 129/69  01/28/20 104/74    Lab Results  Component Value Date   HGBA1C 11.2 (A) 11/08/2019     Patient obtains medications through Vials  90 Days   Last adherence delivery included:  Vials 03/19/2020  Basaglar- Inject 10 units sq daily (90DS)  Farxiga 5 mg - 1 tablet daily (30 DS)  Metformin 500 mg- 1 tablet three times a day before meals (30 DS)  Ondansetron HCL 4 mg- 1 tablet every 8 hours as needed for nausea (7 DS)  Patient declined the following medications last month: Albuterol Hfa- Inhale 2 puffs into lungs every 6 hours as needed Atorvastatin 40 mg - 1 tablet daily (bedtime)- 90 DS on 01/19/20 - due 04/20/20 Clopidogrel 75 mg - 1 tablet daily - 90 DS 02/17/20 - due 05/21/20 Eliquis 5 mg- 1 tablet twice daily - 90 DS 02/17/20 - due 05/21/20 Furosemide 20 mg- 1 tablet daily- 90 DS 01/19/20  - due 04/20/20 Gabapentin 300 mg- 1 tablet twice daily - 90 DS 01/19/20  - due 04/20/20 Gabapentin 100 mg - 1 tablet three times daily- taking 300 mg instead DISCONTINUED Glipizide 5 mg - 1 table twice daily- 90 DS 01/19/20 - due 04/20/20 Hydrocodone  5/325 - Finished course. Isosorbide Mononitrate 30 mg - 0.5 tablet daily (breakfast)- 90 DS 02/18/20 - due 05/21/20 Latanoprost 0.005 % eye drops- 1 drop each eye daily- 75 DS on 03/15/20 - due 05/30/20 Metoclopramide 10 mg - 1 tablet every 6 hours as needed for nausea/vomiting. DISCONTINUED - Taking Zofran instead Metoprolol succinate 25 mg 0.5 tablet daily due to receiving a 90 day supply on 01/31/2020 - due 05/01/20 Nitrofurantoin - Completed course OneTouch test strips and lancets due to having a supplies on hand but patient is not check blood sugars at this time. Pantoprazole 20 mg - 1 tablet daily- 90 DS on 01/19/20 (DISCONTINUED, pt is on pantoprazole 40 mg, she is currently taking 2 of the 20 mg bc we sent her wrong dose last time) Trazodone 50 mg- 1 and 1/2 tablet nightly due to PRN use - PRN    Patient is due for next adherence delivery on: 04/17/20.  Called patient and reviewed medications and coordinated delivery.   This delivery to include: Atorvastatin 40 mg - 1 tablet daily (bedtime) Farxiga 5 mg - 1 tablet daily Furosemide 20 mg- 1 tablet daily Gabapentin 300 mg- 1 tablet twice daily  Glipizide 5 mg - 1 table twice daily Metformin 500 mg- 1 tablet three times a day before meals Metoprolol succinate 25 mg 0.5 tablet daily (due 04/30/20) Pantoprazole 40 mg - 1  Tablet daily  Patient declined the following medications: Basaglar- Inject 10 units sq daily - 04/10/20 states she has not started this. - 90 DS on 03/20/20 Cephalexin 500 mg- completed course Clopidogrel 75 mg - 1 tablet daily - 90 DS 02/17/20 - due 05/21/20 Eliquis 5 mg- 1 tablet twice daily - 90 DS 02/17/20 - due 05/21/20 Gabapentin 100 mg - 1 tablet three times daily-  DISCONTINUED Hydrocodone 5/325 - Finished course. Isosorbide Mononitrate 30 mg - 0.5 tablet daily (breakfast)- 90 DS 02/18/20 - due 05/21/20 Lantus Inj Solostar- Inject 10 units into the skin daily- Patient states she has not started this as of  04/10/20 Latanoprost 0.005 % eye drops- 1 drop each eye daily- 75 DS on 03/15/20 - due 05/30/20 Nitrofurantoin - Completed course Ondansetron 4 mg- Completed course  OneTouch test strips and lancets due to having a supplies on hand but patient is not check blood sugars at this time. Trazodone 50 mg- 1 and 1/2 tablet nightly due to PRN use - PRN   Patient needs refills for : Atorvastatin 40 mg, Farxiga 5 mg, Furosemide 20 mg,  Glipizide 5 mg, Metformin 500 mg,  Pantoprazole 40 mg   From  Dr. [name] - requested 04/10/20  Confirmed delivery date of 04/17/20, advised patient that pharmacy will contact them the morning of delivery.  Patient states she has not been checking her blood pressure or blood sugar. States she will start. Patient also notes that she has not been using any of the diabetes injection medications such as Lantus or Basaglar. She also states she will start using those. Patient notes she is feeling better from recent diagnosis of Covid-19. Discussed packaging medications with patient earlier this month but she states she is not up to going through all of her medications at this time. Will discuss on next med sync call.  Follow-Up:  Coordination of Enhanced Pharmacy Services and Pharmacist Review   [name], CPP notified  Margaretmary Dys, [place] Pharmacy Assistant [phone]  Ms. Pagaduan has been difficult to reach over the past 6 months for CCM visits and not checking BG as directed due to cost concerns and lost BG meter. She lost her meter this past summer and insurance would not cover a new one. Took a new meter to patient's house but she did not start checking BG readings. She reports today she never started the insulin! She is very high risk with uncontrolled diabetes. Requested CMA call her back and ensure she start Basaglar 10 units daily (not Lantus - keep this if needed for later) and check BG daily. Schedule an in person visit with me 05/02/20. Bring all medications.  Requested new prescription for a BG meter to see if insurance will cover it.  [name], PharmD Clinical Pharmacist [facility] at [facility] [phone]

## 2020-04-10 NOTE — Telephone Encounter (Signed)
E-scribed rx for meter.

## 2020-04-10 NOTE — Telephone Encounter (Signed)
-----   Message from [place], [facility] sent at 04/10/2020 11:31 AM EST ----- Regarding: Refills Requesting: Atorvastatin 40 mg, Farxiga 5 mg, Furosemide 20 mg, Glipizide 5 mg, Metformin 500 mg,  Pantoprazole 40 mg     [facility]  [name], PharmD Clinical Pharmacist [facility] at [address] [phone]

## 2020-04-11 NOTE — Telephone Encounter (Signed)
Patient is scheduled EM 

## 2020-04-11 NOTE — Telephone Encounter (Signed)
Noted  

## 2020-04-18 ENCOUNTER — Ambulatory Visit: Payer: Self-pay

## 2020-04-20 ENCOUNTER — Telehealth: Payer: Self-pay

## 2020-04-20 DIAGNOSIS — E1149 Type 2 diabetes mellitus with other diabetic neurological complication: Secondary | ICD-10-CM

## 2020-04-20 DIAGNOSIS — E611 Iron deficiency: Secondary | ICD-10-CM

## 2020-04-20 DIAGNOSIS — E538 Deficiency of other specified B group vitamins: Secondary | ICD-10-CM

## 2020-04-20 DIAGNOSIS — IMO0002 Reserved for concepts with insufficient information to code with codable children: Secondary | ICD-10-CM

## 2020-04-20 DIAGNOSIS — E1122 Type 2 diabetes mellitus with diabetic chronic kidney disease: Secondary | ICD-10-CM

## 2020-04-20 DIAGNOSIS — N183 Chronic kidney disease, stage 3 unspecified: Secondary | ICD-10-CM

## 2020-04-20 DIAGNOSIS — E785 Hyperlipidemia, unspecified: Secondary | ICD-10-CM

## 2020-04-20 DIAGNOSIS — E1169 Type 2 diabetes mellitus with other specified complication: Secondary | ICD-10-CM

## 2020-04-20 NOTE — Telephone Encounter (Signed)
Called Maria Fox to confirm our appointment on 05/02/20 in office at 2 PM and see how she is doing checking BG and taking her insulin.  She reports she is not taking the insulin or checking BG. She never started insulin. She states she cannot explain why.  I asked about needles and she said that is not the issue. She is fine with needles. Cost is not concern, patient's insulin is $10 for 90 DS.  We discussed if a once weekly injection would be easier for her and she said she thinks so. Pt confirms she is taking all other diabetes medications as prescribed.  Will consult PCP to try Ozempic. Reviewed chart and no pancreatitis or history of thyroid cancer.   Lab Results  Component Value Date/Time   HGBA1C 11.2 (A) 11/08/2019 04:04 PM   HGBA1C 10.8 (H) 08/03/2019 01:03 PM   HGBA1C 11.0 (A) 03/15/2019 03:39 PM   HGBA1C 9.0 (H) 12/04/2018 08:13 AM   [name], PharmD Clinical Pharmacist [facility] at [facility] [phone]

## 2020-04-21 MED ORDER — OZEMPIC (0.25 OR 0.5 MG/DOSE) 2 MG/1.5ML ~~LOC~~ SOPN: PEN_INJECTOR | SUBCUTANEOUS | 1 refills | Status: AC

## 2020-04-21 NOTE — Addendum Note (Signed)
Addended by: [name] on: 04/21/2020 01:36 PM   Modules accepted: Orders

## 2020-04-21 NOTE — Telephone Encounter (Signed)
Agreed. I will encourage follow up and monitor for any adverse effects. She has an in office visit with me in 2 weeks.  [name], PharmD Clinical Pharmacist [facility] at [facility] [phone]

## 2020-04-21 NOTE — Telephone Encounter (Signed)
Ok to try ozempic.  Sent to pharmacy for patient to price out.  Need to be cautious with GLP1 RA use given concerns for gastroparesis.  She missed/cancelled last 2 OVs as well - plz encourage her to keep scheduled f/u.

## 2020-04-21 NOTE — Telephone Encounter (Addendum)
Could we go ahead and order labs for Tuesday, 2/22 CCM office visit so she doesn't have to make an extra trip?  [name], PharmD Clinical Pharmacist [facility] at [facility] [phone]

## 2020-04-21 NOTE — Telephone Encounter (Signed)
Labs ordered. Thank you

## 2020-04-24 ENCOUNTER — Telehealth: Payer: Self-pay

## 2020-04-24 NOTE — Chronic Care Management (AMB) (Addendum)
Chronic Care Management Pharmacy Assistant   Name: Maria Fox  MRN: [id number] DOB: 06/30/1945  Reason for Encounter: Maria Fox Reminder  PCP : [name], MD  Allergies:   Allergies  Allergen Reactions   Bee Venom     Throat swelling   Mushroom Extract Complex Anaphylaxis   Penicillins Anaphylaxis, Hives and Swelling    Has patient had a PCN reaction causing immediate rash, facial/tongue/throat swelling, SOB or lightheadedness with hypotension: Yes Has patient had a PCN reaction causing severe rash involving mucus membranes or skin necrosis: Yes Has patient had a PCN reaction that required hospitalization Yes Has patient had a PCN reaction occurring within the last 10 years: No If all of the above answers are "NO", then may proceed with Cephalosporin use.    Codeine Nausea And Vomiting   Sulfa Antibiotics Nausea And Vomiting   Iohexol    Erythromycin Base Rash    Medications: Outpatient Encounter Medications as of 04/24/2020  Medication Sig   acetaminophen (TYLENOL) 500 MG tablet Take 500 mg by mouth as needed.   albuterol (PROAIR HFA) 108 (90 Base) MCG/ACT inhaler Inhale 2 puffs into the lungs every 6 (six) hours as needed for wheezing or shortness of breath.   apixaban (ELIQUIS) 5 MG TABS tablet Take 1 tablet (5 mg total) by mouth 2 (two) times daily.   atorvastatin (LIPITOR) 40 MG tablet TAKE 1 TABLET BY MOUTH EVERYDAY AT BEDTIME   Blood Glucose Monitoring Suppl (ONE TOUCH ULTRA 2) w/Device KIT Use as instructed to check blood sugar 2 times daily as needed.   cephALEXin (KEFLEX) 500 MG capsule Take 1 capsule (500 mg total) by mouth 2 (two) times daily.   clopidogrel (PLAVIX) 75 MG tablet Take 1 tablet (75 mg total) by mouth daily.   Cyanocobalamin (VITAMIN B12) 500 MCG TABS Take 2 tablets by mouth daily.   dapagliflozin propanediol (FARXIGA) 5 MG TABS tablet Take 1 tablet (5 mg total) by mouth daily before breakfast.   docusate sodium (COLACE) 100 MG capsule Take  1 capsule (100 mg total) by mouth daily.   furosemide (LASIX) 20 MG tablet Take 1 tablet (20 mg total) by mouth daily.   gabapentin (NEURONTIN) 300 MG capsule Take 1 capsule (300 mg total) by mouth 2 (two) times daily.   glipiZIDE (GLUCOTROL) 5 MG tablet TAKE 1 TABLET (5 MG TOTAL) BY MOUTH 2 (TWO) TIMES DAILY BEFORE A MEAL.   glucose blood (ONETOUCH ULTRA) test strip Use as instructed to check blood sugar 2 times daily as needed   HYDROcodone-acetaminophen (NORCO/VICODIN) 5-325 MG tablet Take 1 tablet by mouth every 6 (six) hours as needed (breakthrough pain).   insulin glargine (LANTUS SOLOSTAR) 100 UNIT/ML Solostar Pen Inject 10 Units into the skin daily.   isosorbide mononitrate (IMDUR) 30 MG 24 hr tablet Take 0.5 tablets (15 mg total) by mouth daily.   latanoprost (XALATAN) 0.005 % ophthalmic solution Place 1 drop into both eyes at bedtime.    metFORMIN (GLUCOPHAGE) 500 MG tablet Take 1 tablet (500 mg total) by mouth 3 (three) times daily before meals.   metoCLOPramide (REGLAN) 10 MG tablet Take 1 tablet (10 mg total) by mouth every 6 (six) hours as needed for nausea or vomiting.   metoprolol succinate (TOPROL XL) 25 MG 24 hr tablet Take 0.5 tablets (12.5 mg total) by mouth daily.   nitrofurantoin, macrocrystal-monohydrate, (MACROBID) 100 MG capsule Take 1 capsule (100 mg total) by mouth 2 (two) times daily.   NON FORMULARY Antifungal cream  from [place] as needed   ondansetron (ZOFRAN ODT) 4 MG disintegrating tablet Take 1 tablet (4 mg total) by mouth every 8 (eight) hours as needed for nausea or vomiting.   ondansetron (ZOFRAN) 4 MG tablet Take 1 tablet (4 mg total) by mouth every 8 (eight) hours as needed for nausea or vomiting.   ondansetron (ZOFRAN-ODT) 4 MG disintegrating tablet Take 4 mg by mouth every 6 (six) hours as needed for nausea or vomiting.   OneTouch Delica Lancets 10R MISC Use as directed to check blood sugar 2 times daily as needed   pantoprazole (PROTONIX) 40 MG  tablet Take 1 tablet (40 mg total) by mouth 2 (two) times daily.   Semaglutide,0.25 or 0.5MG/DOS, (OZEMPIC, 0.25 OR 0.5 MG/DOSE,) 2 MG/1.5ML SOPN Inject 0.25 mg into the skin once a week for 14 days, THEN 0.5 mg once a week.   traZODone (DESYREL) 50 MG tablet Take 1.5 tablets (75 mg total) by mouth at bedtime as needed for sleep.   No facility-administered encounter medications on file as of 04/24/2020.    Current Diagnosis: Patient Active Problem List   Diagnosis Date Noted   Low-tension glaucoma, bilateral, severe stage 03/24/2020   Dysuria 11/08/2019   Right sided temporal headache 08/06/2019   Atrial flutter with rapid ventricular response ([name])    Acute deep vein thrombosis (DVT) of left tibial vein ([place]) 07/11/2019   Acute cholecystitis 07/07/2019   Pain of right breast 03/15/2019   Health maintenance examination 12/11/2018   Type 2 diabetes mellitus with severe nonproliferative diabetic retinopathy with macular edema, bilateral ([place]) 07/16/2018   Coronary artery disease of native artery of native heart with stable angina pectoris ([name])    Effort angina ([place]) 11/12/2017   Abnormal stress ECG    Dyspnea on exertion 11/04/2017   GERD (gastroesophageal reflux disease) 08/15/2017   Trigger finger 06/09/2017   Nocturnal leg movements 03/25/2017   Osteopenia 02/01/2017   Advanced care planning/counseling discussion 01/02/2017   Stage 3 chronic kidney disease due to type 2 diabetes mellitus ([place]) 08/26/2016   Insomnia 08/26/2016   Vitamin B12 deficiency 11/10/2015   General weakness 08/04/2015   Epistaxis 07/07/2015   Fatty liver disease, nonalcoholic 15/94/5859   Chronic diastolic CHF (congestive heart failure) ([place]) 08/27/2014   Positive hepatitis C antibody test 08/27/2014   Iron deficiency 08/27/2014   NSVT (nonsustained ventricular tachycardia) ([name])    Nausea without vomiting    Autonomic orthostatic hypotension 07/11/2014   S/P CABG x 3 07/08/2014   History of  cerebrovascular accident (CVA) in adulthood 07/08/2014   DM (diabetes mellitus), type 2, uncontrolled w/neurologic complication ([place]) 29/24/4628   Itching 03/16/2012   Rash 03/16/2012   Medicare annual wellness visit, initial 10/28/2011   Hyperlipidemia associated with type 2 diabetes mellitus ([place])    Idiopathic angioedema 06/23/2011   Vitiligo 06/23/2011   Dermatitis 06/23/2011   MDD (major depressive disorder), recurrent severe, without psychosis ([place]) 05/08/2006   Hypertension, essential 05/08/2006   MENOPAUSAL SYNDROME 05/08/2006   Contacted patient to discuss Ozempic, per [name], Pharm. D. Patient states she just received delivery of medication today and plans to inject this evening. Advised patient I would contact her later in the week to see how she tolerates it.   Follow-Up:  Pharmacist Review  [name], CPP notified  Margaretmary Dys, [place] Assistant [phone]  I have reviewed the care management and care coordination activities outlined in this encounter and I am certifying that I agree with the content  of this note. No further action required.  [name], PharmD Clinical Pharmacist [facility] at [facility] [phone]

## 2020-05-02 ENCOUNTER — Other Ambulatory Visit: Payer: Self-pay

## 2020-05-02 ENCOUNTER — Ambulatory Visit (INDEPENDENT_AMBULATORY_CARE_PROVIDER_SITE_OTHER): Payer: Medicare Other

## 2020-05-02 ENCOUNTER — Other Ambulatory Visit (INDEPENDENT_AMBULATORY_CARE_PROVIDER_SITE_OTHER): Payer: Medicare Other

## 2020-05-02 DIAGNOSIS — E1169 Type 2 diabetes mellitus with other specified complication: Secondary | ICD-10-CM

## 2020-05-02 DIAGNOSIS — E1165 Type 2 diabetes mellitus with hyperglycemia: Secondary | ICD-10-CM

## 2020-05-02 DIAGNOSIS — E1149 Type 2 diabetes mellitus with other diabetic neurological complication: Secondary | ICD-10-CM | POA: Diagnosis not present

## 2020-05-02 DIAGNOSIS — F332 Major depressive disorder, recurrent severe without psychotic features: Secondary | ICD-10-CM | POA: Diagnosis not present

## 2020-05-02 DIAGNOSIS — IMO0002 Reserved for concepts with insufficient information to code with codable children: Secondary | ICD-10-CM

## 2020-05-02 DIAGNOSIS — E611 Iron deficiency: Secondary | ICD-10-CM

## 2020-05-02 DIAGNOSIS — E785 Hyperlipidemia, unspecified: Secondary | ICD-10-CM

## 2020-05-02 DIAGNOSIS — E538 Deficiency of other specified B group vitamins: Secondary | ICD-10-CM

## 2020-05-02 DIAGNOSIS — E1122 Type 2 diabetes mellitus with diabetic chronic kidney disease: Secondary | ICD-10-CM

## 2020-05-02 DIAGNOSIS — N183 Chronic kidney disease, stage 3 unspecified: Secondary | ICD-10-CM | POA: Diagnosis not present

## 2020-05-02 NOTE — Progress Notes (Signed)
Chronic Care Management Pharmacy Note  05/03/2020 Name:  Maria Fox MRN:  [id number] DOB:  12-Sep-1945  Subjective: Maria Fox is an 75 y.o. year old female who is a primary patient of Maria Bush, MD.  The CCM team was consulted for assistance with disease management and care coordination needs.    Engaged with patient face to face for follow up visit in response to provider referral for pharmacy case management and/or care coordination services.   Consent to Services:  The patient was given information about Chronic Care Management services, agreed to services, and gave verbal consent prior to initiation of services.  Please see initial visit note for detailed documentation.   Patient Care Team: Maria Bush, MD as PCP - General (Family Medicine) Maria Margarita, MD as PCP - Cardiology (Cardiology) [name], [facility] as Pharmacist (Pharmacist)  Recent office visits:  Patient missed PCP visit follow up  11/08/19 - PCP - DM - does not regularly check sugars - lost her meter. Compliant with antihyperglycemic regimen which includes: farxiga 59m daily, glipizide 546mbid, metformin 50067mID. Tolerating this regimen well. Wants to avoid injections at all costs. Denies low sugars or hypoglycemic symptoms.  Foot exam completed. Start insulin, RTC 1 month for glucose log.  Recent consult visits:   01/28/20 - Cardiology - ASCAD, continue ASA 29m27mily, Plavix 75mg65mly, statin and BB. HTN, controlled, continue Lopressor 12.5mg d33my>>it says on her med rec that she is on short acting lopressor which can exacerbate COPD>>will check with pharmacy. A-flutter, continue Eliquis BID. CHF, Conte Lasix 20 mg daily. HLD, cont atorvastatin 20 mg daily.   Hospital visits: 03/27/20 - COVID-19  Objective:  Lab Results  Component Value Date   CREATININE 1.38 (H) 05/02/2020   BUN 12 05/02/2020   GFR 37.02 (L) 05/02/2020   GFRNONAA 37 (L) 03/27/2020   GFRAA 41 (L) 01/28/2020   NA  140 05/02/2020   K 3.7 05/02/2020   CALCIUM 9.7 05/02/2020   CO2 29 05/02/2020    Lab Results  Component Value Date/Time   HGBA1C 12.2 (H) 05/02/2020 03:25 PM   HGBA1C 11.2 (A) 11/08/2019 04:04 PM   HGBA1C 10.8 (H) 08/03/2019 01:03 PM   FRUCTOSAMINE 302 (H) 11/10/2015 04:27 PM   GFR 37.02 (L) 05/02/2020 03:25 PM   GFR 41.63 (L) 08/03/2019 01:03 PM   MICROALBUR <0.7 05/02/2020 03:25 PM   MICROALBUR <0.7 11/08/2019 05:08 PM    Last diabetic Eye exam:  Lab Results  Component Value Date/Time   HMDIABEYEEXA Retinopathy (A) 03/23/2020 12:00 AM    Last diabetic Foot exam: 10/2019  Lab Results  Component Value Date   CHOL 109 05/02/2020   HDL 41.10 05/02/2020   LDLCALC 40 05/02/2020   LDLDIRECT 134.9 10/21/2011   TRIG 139.0 05/02/2020   CHOLHDL 3 05/02/2020    Hepatic Function Latest Ref Rng & Units 05/02/2020 03/27/2020 08/03/2019  Total Protein 6.0 - 8.3 g/dL 7.4 6.9 -  Albumin 3.5 - 5.2 g/dL 4.1 3.4(L) 4.2  AST 0 - 37 U/L 15 48(H) -  ALT 0 - 35 U/L 10 28 -  Alk Phosphatase 39 - 117 U/L 101 81 -  Total Bilirubin 0.2 - 1.2 mg/dL 0.8 0.8 -  Bilirubin, Direct 0.00 - 0.40 mg/dL - - -    Lab Results  Component Value Date/Time   TSH 1.247 07/10/2019 10:27 AM   TSH 2.64 05/02/2017 04:13 PM   TSH 1.355 07/26/2015 04:21 AM   TSH 3.457 07/08/2014 05:00  AM   FREET4 0.87 07/13/2014 11:18 AM    CBC Latest Ref Rng & Units 03/27/2020 01/28/2020 08/03/2019  WBC 4.0 - 10.5 K/uL 4.7 7.6 6.6  Hemoglobin 12.0 - 15.0 g/dL 14.7 13.5 13.5  Hematocrit 36.0 - 46.0 % 43.9 40.0 40.2  Platelets 150 - 400 K/uL 188 226 228.0    Lab Results  Component Value Date/Time   VD25OH 32.36 05/02/2020 03:25 PM    Clinical ASCVD: Yes  The ASCVD Risk score Mikey Bussing DC Jr., et al., 2013) failed to calculate for the following reasons:   The patient has a prior MI or stroke diagnosis    Depression screen [facility] 2/9 05/02/2020 03/15/2019 12/11/2018  Decreased Interest 2 0 1  Down, Depressed, Hopeless 2 0 1  PHQ  - 2 Score 4 0 2  Altered sleeping 0 - 1  Tired, decreased energy 2 - 1  Change in appetite 2 - 0  Feeling bad or failure about yourself  2 - 1  Trouble concentrating 1 - 1  Moving slowly or fidgety/restless 1 - 1  Suicidal thoughts 0 - 1  PHQ-9 Score 12 - 8  Difficult doing work/chores Very difficult - -  Some recent data might be hidden     Social History   Tobacco Use  Smoking Status Never Smoker  Smokeless Tobacco Never Used   BP Readings from Last 3 Encounters:  03/29/20 (!) 141/85  03/28/20 129/69  01/28/20 104/74   Pulse Readings from Last 3 Encounters:  03/29/20 97  03/28/20 86  01/28/20 77   Wt Readings from Last 3 Encounters:  03/27/20 145 lb (65.8 kg)  01/28/20 144 lb (65.3 kg)  11/08/19 146 lb 6 oz (66.4 kg)    Assessment/Interventions: Review of patient past medical history, allergies, medications, health status, including review of consultants reports, laboratory and other test data, was performed as part of comprehensive evaluation and provision of chronic care management services.   SDOH:  (Social Determinants of Health) assessments and interventions performed: Yes SDOH Interventions   Flowsheet Row Most Recent Value  SDOH Interventions   Financial Strain Interventions Intervention Not Indicated  [Medications affordable]  Depression Interventions/Treatment  Medication      CCM Care Plan  Allergies  Allergen Reactions  . Bee Venom     Throat swelling  . Mushroom Extract Complex Anaphylaxis  . Penicillins Anaphylaxis, Hives and Swelling    Has patient had a PCN reaction causing immediate rash, facial/tongue/throat swelling, SOB or lightheadedness with hypotension: Yes Has patient had a PCN reaction causing severe rash involving mucus membranes or skin necrosis: Yes Has patient had a PCN reaction that required hospitalization Yes Has patient had a PCN reaction occurring within the last 10 years: No If all of the above answers are "NO", then may  proceed with Cephalosporin use.   . Codeine Nausea And Vomiting  . Sulfa Antibiotics Nausea And Vomiting  . Iohexol   . Erythromycin Base Rash    Medications Reviewed Today    Reviewed by [name], [facility] (Pharmacist) on 05/02/20 at 1653  Med List Status: <None>  Medication Order Taking? Sig Documenting Provider Last Dose Status Informant  acetaminophen (TYLENOL) 500 MG tablet [id number] Yes Take 500 mg by mouth as needed. [provider] Taking Active Self  albuterol (PROAIR HFA) 108 (90 Base) MCG/ACT inhaler [id number] Yes Inhale 2 puffs into the lungs every 6 (six) hours as needed for wheezing or shortness of breath. Maria Bush, MD Taking Active   apixaban Arne [place])  5 MG TABS tablet [id number] Yes Take 1 tablet (5 mg total) by mouth 2 (two) times daily. Maria Bush, MD Taking Expired 04/19/20 2359   atorvastatin (LIPITOR) 40 MG tablet [id number] Yes TAKE 1 TABLET BY MOUTH EVERYDAY AT BEDTIME Maria Bush, MD Taking Active   Blood Glucose Monitoring Suppl (ONE TOUCH ULTRA 2) w/Device KIT [id number] No Use as instructed to check blood sugar 2 times daily as needed.  Patient not taking: Reported on 05/02/2020   Maria Bush, MD Not Taking Active   clopidogrel (PLAVIX) 75 MG tablet [id number] Yes Take 1 tablet (75 mg total) by mouth daily. Maria Margarita, MD Taking Active   Cyanocobalamin (VITAMIN B12) 500 MCG TABS [id number] Yes Take 2 tablets by mouth daily. [provider] Taking Active   dapagliflozin propanediol (FARXIGA) 5 MG TABS tablet [id number] Yes Take 1 tablet (5 mg total) by mouth daily before breakfast. Maria Bush, MD Taking Active   docusate sodium (COLACE) 100 MG capsule [id number] Yes Take 1 capsule (100 mg total) by mouth daily. Maria Bush, MD Taking Active Self  furosemide (LASIX) 20 MG tablet [id number] Yes Take 1 tablet (20 mg total) by mouth daily. Maria Bush, MD Taking Active   gabapentin (NEURONTIN) 300 MG capsule  [id number] Yes Take 1 capsule (300 mg total) by mouth 2 (two) times daily. Maria Bush, MD Taking Active   glipiZIDE (GLUCOTROL) 5 MG tablet [id number] Yes TAKE 1 TABLET (5 MG TOTAL) BY MOUTH 2 (TWO) TIMES DAILY BEFORE A MEAL. Maria Bush, MD Taking Active   glucose blood [facility] ULTRA) test strip [id number] Yes Use as instructed to check blood sugar 2 times daily as needed Maria Bush, MD Taking Active   insulin glargine (LANTUS SOLOSTAR) 100 UNIT/ML Solostar Pen [id number] No Inject 10 Units into the skin daily.  Patient not taking: Reported on 05/02/2020   Maria Bush, MD Not Taking Active   isosorbide mononitrate (IMDUR) 30 MG 24 hr tablet [id number] Yes Take 0.5 tablets (15 mg total) by mouth daily. Maria Margarita, MD Taking Active   latanoprost (XALATAN) 0.005 % ophthalmic solution [id number] Yes Place 1 drop into both eyes at bedtime.  [provider] Taking Active Self  metFORMIN (GLUCOPHAGE) 500 MG tablet [id number] Yes Take 1 tablet (500 mg total) by mouth 3 (three) times daily before meals. Maria Bush, MD Taking Active   metoCLOPramide (REGLAN) 10 MG tablet [id number] Yes Take 1 tablet (10 mg total) by mouth every 6 (six) hours as needed for nausea or vomiting. Maria Bush, MD Taking Active   metoprolol succinate (TOPROL XL) 25 MG 24 hr tablet [id number] Yes Take 0.5 tablets (12.5 mg total) by mouth daily. Maria Margarita, MD Taking Active   ondansetron (ZOFRAN ODT) 4 MG disintegrating tablet [id number] Yes Take 1 tablet (4 mg total) by mouth every 8 (eight) hours as needed for nausea or vomiting. Quintella Reichert, MD Taking Active   [facility] Lancets 29V MISC [id number]  Use as directed to check blood sugar 2 times daily as needed Maria Bush, MD  Active   pantoprazole (PROTONIX) 40 MG tablet [id number] Yes Take 1 tablet (40 mg total) by mouth 2 (two) times daily. Maria Bush, MD Taking Active   Semaglutide,0.25 or 0.5MG/DOS, (OZEMPIC,  0.25 OR 0.5 MG/DOSE,) 2 MG/1.5ML SOPN [id number] Yes Inject 0.25 mg into the skin once a week for 14 days, THEN 0.5 mg once a week. Maria Bush, MD Taking Active           Patient Active  Problem List   Diagnosis Date Noted  . Low-tension glaucoma, bilateral, severe stage 03/24/2020  . Dysuria 11/08/2019  . Right sided temporal headache 08/06/2019  . Atrial flutter with rapid ventricular response ([place])   . Acute deep vein thrombosis (DVT) of left tibial vein ([place]) 07/11/2019  . Acute cholecystitis 07/07/2019  . Pain of right breast 03/15/2019  . Health maintenance examination 12/11/2018  . Type 2 diabetes mellitus with severe nonproliferative diabetic retinopathy with macular edema, bilateral ([place]) 07/16/2018  . Coronary artery disease of native artery of native heart with stable angina pectoris ([place])   . Effort angina ([place]) 11/12/2017  . Abnormal stress ECG   . Dyspnea on exertion 11/04/2017  . GERD (gastroesophageal reflux disease) 08/15/2017  . Trigger finger 06/09/2017  . Nocturnal leg movements 03/25/2017  . Osteopenia 02/01/2017  . Advanced care planning/counseling discussion 01/02/2017  . Stage 3 chronic kidney disease due to type 2 diabetes mellitus ([place]) 08/26/2016  . Insomnia 08/26/2016  . Vitamin B12 deficiency 11/10/2015  . General weakness 08/04/2015  . Epistaxis 07/07/2015  . Fatty liver disease, nonalcoholic 25/85/2778  . Chronic diastolic CHF (congestive heart failure) ([place]) 08/27/2014  . Positive hepatitis C antibody test 08/27/2014  . Iron deficiency 08/27/2014  . NSVT (nonsustained ventricular tachycardia) ([place])   . Nausea without vomiting   . Autonomic orthostatic hypotension 07/11/2014  . S/P CABG x 3 07/08/2014  . History of cerebrovascular accident (CVA) in adulthood 07/08/2014  . DM (diabetes mellitus), type 2, uncontrolled w/neurologic complication ([place]) 24/23/5361  . Itching 03/16/2012  . Rash 03/16/2012  . Medicare annual wellness visit,  initial 10/28/2011  . Hyperlipidemia associated with type 2 diabetes mellitus ([place])   . Idiopathic angioedema 06/23/2011  . Vitiligo 06/23/2011  . Dermatitis 06/23/2011  . MDD (major depressive disorder), recurrent severe, without psychosis ([place]) 05/08/2006  . Hypertension, essential 05/08/2006  . MENOPAUSAL SYNDROME 05/08/2006    Immunization History  Administered Date(s) Administered  . Fluad Quad(high Dose 65+) 12/11/2018  . Influenza Split 12/17/2011  . Influenza Whole 01/22/2013  . Influenza,inj,Quad PF,6+ Mos 11/10/2015, 12/26/2016, 11/26/2017  . Moderna Sars-Covid-2 Vaccination 03/21/2020  . Pneumococcal Conjugate-13 12/26/2016  . Pneumococcal-Unspecified 11/30/2010  . Td 08/10/2003    Conditions to be addressed/monitored:  Diabetes and Depression  Care Plan : [place]  Updates made by [name], [facility] since 05/03/2020 12:00 AM    Problem: CHL AMB "PATIENT-SPECIFIC PROBLEM"     Long-Range Goal: Disease Management   Start Date: 05/03/2020  Priority: High  Note:    Current Barriers:  . Unable to independently monitor therapeutic efficacy . Unable to achieve control of diabetes  . Unable to self administer medications as prescribed  Pharmacist Clinical Goal(s):  Marland Kitchen Over the next 30 days, patient will achieve adherence to monitoring guidelines and medication adherence to achieve therapeutic efficacy through collaboration with PharmD and provider.   Interventions: . 1:1 collaboration with Maria Bush, MD regarding development and update of comprehensive plan of care as evidenced by provider attestation and co-signature . Inter-disciplinary care team collaboration (see longitudinal plan of care) . Comprehensive medication review performed; medication list updated in electronic medical record  Diabetes (A1c goal <7%) -Uncontrolled -Current medications:  Ozempic 0.25 mg - 1 injection weekly for 4 weeks, then increase to 0.5 mg weekly  Farxiga  5 mg - 1 tablet daily  Metformin 500 mg - 1 tablet TID  Glipizide 5 mg - 1 tablet BID  -Medications previously tried: Lantus - never started -Current  home glucose readings - misplaced meter . fasting glucose: none . post prandial glucose: none -Denies hypoglycemic/hyperglycemic symptoms -Current meal patterns: eats out most meals - provided a fast food dietary handout . snacks: potato chips . drinks: pineapple, apple, orange, and tomato juice, coke, 7-up -Educated onsugar content in fruit juices and soda - try flavored water instead -Counseled to check feet daily and get yearly eye exams -Ms. Tyara has not started her Ozempic. Demonstrated how to use it today in office. She declined insulin due to daily injections. She is willing to give the Ozempic a try. Discussed potential of stopping glipizide if Ozempic is effective. We also discussed pill burden. She would like to try metformin ER 500 mg so she doesn't have to take it TID (recommend dose reduction to 1000 mg daily due to GFR < 45).   -Recommended starting Ozempic this week. Recommend change metformin to ER 500 mg - 2 tablets in the morning with breakfast due to CKD.   Depression/Anxiety (Goal: Improve symptoms) -Uncontrolled  -History of auditory hallucinations  -Current treatment: . No pharmacotherapy . She has trazodone PRN for sleep but has not taken it  -Medications previously tried/failed: sertraline 2015 (75 mg) --> changed to duloxetine 30, up to 60 mg (2017-2018) citalopram 2018 - unknown efficacy of all, there is note in chart that duloxetine was not effective and thus changed to citalopram. But then later placed back on Cymbalta after a psych admission along with trazodone and Geodon. -PHQ9:  12 (Today) -Declines interest in CBT. Reports this was not effective in the past. She is interested in medication. -Educated on Benefits of medication for symptom control Benefits of cognitive-behavioral therapy with or without  medication -Collaborated with Dr. [name] for antidepressant therapy - consider restarting Cymbalta  Patient Goals/Self-Care Activities . Over the next 30 days, patient will:  - take medications as prescribed  Follow Up Plan: Next PCP appointment scheduled for:  June 13, 2020; CMA follow up call in 2 weeks to review Ozempic adherence/tolerance     Medication Assistance: None required.  Patient affirms current coverage meets needs.  Patient's preferred pharmacy is: [facility] - [place], [place] - [address] [place] [address] Phone: [phone] Fax: [phone]  Uses pill box? Yes Pt endorses 100% compliance - except insulin, Ozempic (not taking)  Care Plan and Follow Up Patient Decision:  Patient agrees to Care Plan and Follow-up.  [name], PharmD Clinical Pharmacist [facility] at [facility] [phone]

## 2020-05-03 ENCOUNTER — Telehealth: Payer: Self-pay

## 2020-05-03 LAB — MICROALBUMIN / CREATININE URINE RATIO
Creatinine,U: 15.3 mg/dL
Microalb Creat Ratio: 4.6 mg/g (ref 0.0–30.0)
Microalb, Ur: <0.7 mg/dL (ref 0.0–1.9)

## 2020-05-03 LAB — COMPREHENSIVE METABOLIC PANEL
ALT: 10 U/L (ref 0–35)
AST: 15 U/L (ref 0–37)
Albumin: 4.1 g/dL (ref 3.5–5.2)
Alkaline Phosphatase: 101 U/L (ref 39–117)
BUN: 12 mg/dL (ref 6–23)
CO2: 29 mEq/L (ref 19–32)
Calcium: 9.7 mg/dL (ref 8.4–10.5)
Chloride: 100 mEq/L (ref 96–112)
Creatinine, Ser: 1.38 mg/dL — ABNORMAL HIGH (ref 0.40–1.20)
GFR: 37.02 mL/min — ABNORMAL LOW (ref >60.00)
Glucose, Bld: 181 mg/dL — ABNORMAL HIGH (ref 70–99)
Potassium: 3.7 mEq/L (ref 3.5–5.1)
Sodium: 140 mEq/L (ref 135–145)
Total Bilirubin: 0.8 mg/dL (ref 0.2–1.2)
Total Protein: 7.4 g/dL (ref 6.0–8.3)

## 2020-05-03 LAB — VITAMIN B12: Vitamin B-12: 906 pg/mL (ref 211–911)

## 2020-05-03 LAB — LIPID PANEL
Cholesterol: 109 mg/dL (ref 0–200)
HDL: 41.1 mg/dL (ref >39.00)
LDL Cholesterol: 40 mg/dL (ref 0–99)
NonHDL: 68.17
Total CHOL/HDL Ratio: 3
Triglycerides: 139 mg/dL (ref 0.0–149.0)
VLDL: 27.8 mg/dL (ref 0.0–40.0)

## 2020-05-03 LAB — FERRITIN: Ferritin: 25 ng/mL (ref 10.0–291.0)

## 2020-05-03 LAB — VITAMIN D 25 HYDROXY (VIT D DEFICIENCY, FRACTURES): VITD: 32.36 ng/mL (ref 30.00–100.00)

## 2020-05-03 LAB — HEMOGLOBIN A1C: Hgb A1c MFr Bld: 12.2 % — ABNORMAL HIGH (ref 4.6–6.5)

## 2020-05-03 NOTE — Chronic Care Management (AMB) (Addendum)
Contact patient to check on Maria Fox tolerance. Patient states she has not taken it yet, she is trying to get in the right mind set. States she plans to start this week.   Follow-Up:  Pharmacist Review  [name], CPP notified  Margaretmary Dys, [place] Pharmacy Assistant [phone]

## 2020-05-03 NOTE — Telephone Encounter (Signed)
PCP consult regarding CCM 05/02/20:   Patient completed PHQ-9 with score of 12 at yesterday's visit. She reports depressed mood and would like to restart medication. Seems Cymbalta may have been effective in the past. She does not recall.    We also discussed changing her metformin to extended release to simplify regimen. With her GFR 37 ml/min recommend reducing dose to 1000 mg daily. (Metformin ER 500 mg - 2 tablet in the morning). She uses [facility]. Please send new rx if approved.   Discussed A1c elevation and need to keep follow up with PCP. Demonstrated Ozempic and she plans to start it this week. Will follow up with patient in 2 weeks.  Lab Results  Component Value Date/Time   HGBA1C 12.2 (H) 05/02/2020 03:25 PM   HGBA1C 11.2 (A) 11/08/2019 04:04 PM   HGBA1C 10.8 (H) 08/03/2019 01:03 PM   Lab Results  Component Value Date   CREATININE 1.38 (H) 05/02/2020   BUN 12 05/02/2020   GFR 37.02 (L) 05/02/2020   GFRNONAA 37 (L) 03/27/2020   GFRAA 41 (L) 01/28/2020   NA 140 05/02/2020   K 3.7 05/02/2020   CALCIUM 9.7 05/02/2020   CO2 29 05/02/2020    Thanks,  [name], PharmD Clinical Pharmacist [facility] at [facility] [phone]

## 2020-05-03 NOTE — Patient Instructions (Signed)
Dear Maria Fox,  Below is a summary of the goals we discussed during our follow up appointment on May 03, 2020. Please contact me anytime with questions or concerns.   Visit Information  Patient Care Plan: CCM Pharmacy Care Plan    Problem Identified: CH[facility] AMB "PATIENT-SPECIFIC PROBLEM"     Long-Range Goal: Disease Management   Start Date: 05/03/2020  Priority: High  Note:    Current Barriers:  . Unable to independently monitor therapeutic efficacy . Unable to achieve control of diabetes  . Unable to self administer medications as prescribed  Pharmacist Clinical Goal(s):  Marland Kitchen Over the next 30 days, patient will achieve adherence to monitoring guidelines and medication adherence to achieve therapeutic efficacy through collaboration with PharmD and provider.   Interventions: . 1:1 collaboration with Maria Bush, MD regarding development and update of comprehensive plan of care as evidenced by provider attestation and co-signature . Inter-disciplinary care team collaboration (see longitudinal plan of care) . Comprehensive medication review performed; medication list updated in electronic medical record  Diabetes (A1c goal <7%) -Uncontrolled -Current medications:  Ozempic 0.25 mg - 1 injection weekly for 4 weeks, then increase to 0.5 mg weekly  Farxiga 5 mg - 1 tablet daily  Metformin 500 mg - 1 tablet TID  Glipizide 5 mg - 1 tablet BID  -Medications previously tried: Lantus - never started -Current home glucose readings - misplaced meter . fasting glucose: none . post prandial glucose: none -Denies hypoglycemic/hyperglycemic symptoms -Current meal patterns: eats out most meals - provided a fast food dietary handout . snacks: potato chips . drinks: pineapple, apple, orange, and tomato juice, coke, 7-up -Educated onsugar content in fruit juices and soda - try flavored water instead -Counseled to check feet daily and get yearly eye exams -Ms. Maria Fox has not started  her Ozempic. Demonstrated how to use it today in office. She declined insulin due to daily injections. She is willing to give the Ozempic a try. Discussed potential of stopping glipizide if Ozempic is effective. We also discussed pill burden. She would like to try metformin ER 500 mg so she doesn't have to take it TID (recommend dose reduction to 1000 mg daily due to GFR < 45).   -Recommended starting Ozempic this week. Recommend change metformin to ER 500 mg - 2 tablets in the morning with breakfast due to CKD.   Depression/Anxiety (Goal: Improve symptoms) -Uncontrolled  -History of auditory hallucinations  -Current treatment: . No pharmacotherapy . She has trazodone PRN for sleep but has not taken it  -Medications previously tried/failed: sertraline 2015 (75 mg) --> changed to duloxetine 30, up to 60 mg (2017-2018) citalopram 2018 - unknown efficacy of all, there is note in chart that duloxetine was not effective and thus changed to citalopram. But then later placed back on Cymbalta after a psych admission along with trazodone and Geodon. -PHQ9:  12 (Today) -Declines interest in CBT. Reports this was not effective in the past. She is interested in medication. -Educated on Benefits of medication for symptom control Benefits of cognitive-behavioral therapy with or without medication -Collaborated with Dr. Danise Fox for antidepressant therapy - consider restarting Cymbalta  Patient Goals/Self-Care Activities . Over the next 30 days, patient will:  - take medications as prescribed  Follow Up Plan: Next PCP appointment scheduled for:  June 13, 2020; CMA follow up call in 2 weeks to review Ozempic adherence/tolerance      The patient verbalized understanding of instructions, educational materials, and care plan provided today and  declined offer to receive copy of patient instructions, educational materials, and care plan.   [name], PharmD Clinical Pharmacist [facility] at  [facility] [phone]  Diabetes Mellitus and Nutrition, Adult When you have diabetes, or diabetes mellitus, it is very important to have healthy eating habits because your blood sugar (glucose) levels are greatly affected by what you eat and drink. Eating healthy foods in the right amounts, at about the same times every day, can help you:  Control your blood glucose.  Lower your risk of heart disease.  Improve your blood pressure.  Reach or maintain a healthy weight. What can affect my meal plan? Every person with diabetes is different, and each person has different needs for a meal plan. Your health care provider may recommend that you work with a dietitian to make a meal plan that is best for you. Your meal plan may vary depending on factors such as:  The calories you need.  The medicines you take.  Your weight.  Your blood glucose, blood pressure, and cholesterol levels.  Your activity level.  Other health conditions you have, such as heart or kidney disease. How do carbohydrates affect me? Carbohydrates, also called carbs, affect your blood glucose level more than any other type of food. Eating carbs naturally raises the amount of glucose in your blood. Carb counting is a method for keeping track of how many carbs you eat. Counting carbs is important to keep your blood glucose at a healthy level, especially if you use insulin or take certain oral diabetes medicines. It is important to know how many carbs you can safely have in each meal. This is different for every person. Your dietitian can help you calculate how many carbs you should have at each meal and for each snack. How does alcohol affect me? Alcohol can cause a sudden decrease in blood glucose (hypoglycemia), especially if you use insulin or take certain oral diabetes medicines. Hypoglycemia can be a life-threatening condition. Symptoms of hypoglycemia, such as sleepiness, dizziness, and confusion, are similar to  symptoms of having too much alcohol.  Do not drink alcohol if: ? Your health care provider tells you not to drink. ? You are pregnant, may be pregnant, or are planning to become pregnant.  If you drink alcohol: ? Do not drink on an empty stomach. ? Limit how much you use to:  0-1 drink a day for women.  0-2 drinks a day for men. ? Be aware of how much alcohol is in your drink. In the [place] one drink equals one 12 oz bottle of beer (355 mL), one 5 oz glass of wine (148 mL), or one 1 oz glass of hard liquor (44 mL). ? Keep yourself hydrated with water, diet soda, or unsweetened iced tea.  Keep in mind that regular soda, juice, and other mixers may contain a lot of sugar and must be counted as carbs. What are tips for following this plan? Reading food labels  Start by checking the serving size on the "Nutrition Facts" label of packaged foods and drinks. The amount of calories, carbs, fats, and other nutrients listed on the label is based on one serving of the item. Many items contain more than one serving per package.  Check the total grams (g) of carbs in one serving. You can calculate the number of servings of carbs in one serving by dividing the total carbs by 15. For example, if a food has 30 g of total carbs per serving,  it would be equal to 2 servings of carbs.  Check the number of grams (g) of saturated fats and trans fats in one serving. Choose foods that have a low amount or none of these fats.  Check the number of milligrams (mg) of salt (sodium) in one serving. Most people should limit total sodium intake to less than 2,300 mg per day.  Always check the nutrition information of foods labeled as "low-fat" or "nonfat." These foods may be higher in added sugar or refined carbs and should be avoided.  Talk to your dietitian to identify your daily goals for nutrients listed on the label. Shopping  Avoid buying canned, pre-made, or processed foods. These foods tend to be high in  fat, sodium, and added sugar.  Shop around the outside edge of the grocery store. This is where you will most often find fresh fruits and vegetables, bulk grains, fresh meats, and fresh dairy. Cooking  Use low-heat cooking methods, such as baking, instead of high-heat cooking methods like deep frying.  Cook using healthy oils, such as olive, canola, or sunflower oil.  Avoid cooking with butter, cream, or high-fat meats. Meal planning  Eat meals and snacks regularly, preferably at the same times every day. Avoid going long periods of time without eating.  Eat foods that are high in fiber, such as fresh fruits, vegetables, beans, and whole grains. Talk with your dietitian about how many servings of carbs you can eat at each meal.  Eat 4-6 oz (112-168 g) of lean protein each day, such as lean meat, chicken, fish, eggs, or tofu. One ounce (oz) of lean protein is equal to: ? 1 oz (28 g) of meat, chicken, or fish. ? 1 egg. ?  cup (62 g) of tofu.  Eat some foods each day that contain healthy fats, such as avocado, nuts, seeds, and fish.   What foods should I eat? Fruits Berries. Apples. Oranges. Peaches. Apricots. Plums. Grapes. Mango. Papaya. Pomegranate. Kiwi. Cherries. Vegetables Lettuce. Spinach. Leafy greens, including kale, chard, collard greens, and mustard greens. Beets. Cauliflower. Cabbage. Broccoli. Carrots. Green beans. Tomatoes. Peppers. Onions. Cucumbers. Brussels sprouts. Grains Whole grains, such as whole-wheat or whole-grain bread, crackers, tortillas, cereal, and pasta. Unsweetened oatmeal. Quinoa. Brown or wild rice. Meats and other proteins Seafood. Poultry without skin. Lean cuts of poultry and beef. Tofu. Nuts. Seeds. Dairy Low-fat or fat-free dairy products such as milk, yogurt, and cheese. The items listed above may not be a complete list of foods and beverages you can eat. Contact a dietitian for more information. What foods should I avoid? Fruits Fruits canned  with syrup. Vegetables Canned vegetables. Frozen vegetables with butter or cream sauce. Grains Refined white flour and flour products such as bread, pasta, snack foods, and cereals. Avoid all processed foods. Meats and other proteins Fatty cuts of meat. Poultry with skin. Breaded or fried meats. Processed meat. Avoid saturated fats. Dairy Full-fat yogurt, cheese, or milk. Beverages Sweetened drinks, such as soda or iced tea. The items listed above may not be a complete list of foods and beverages you should avoid. Contact a dietitian for more information. Questions to ask a health care provider  Do I need to meet with a diabetes educator?  Do I need to meet with a dietitian?  What number can I call if I have questions?  When are the best times to check my blood glucose? Where to find more information:  American Diabetes Association: diabetes.org  Academy of Nutrition and Dietetics: www.eatright.org  [employer] of Diabetes and Digestive and Kidney Diseases: [employer]  Association of Diabetes Care and Education Specialists: www.diabeteseducator.org Summary  It is important to have healthy eating habits because your blood sugar (glucose) levels are greatly affected by what you eat and drink.  A healthy meal plan will help you control your blood glucose and maintain a healthy lifestyle.  Your health care provider may recommend that you work with a dietitian to make a meal plan that is best for you.  Keep in mind that carbohydrates (carbs) and alcohol have immediate effects on your blood glucose levels. It is important to count carbs and to use alcohol carefully. This information is not intended to replace advice given to you by your health care provider. Make sure you discuss any questions you have with your health care provider. Document Revised: 02/02/2019 Document Reviewed: 02/02/2019 Elsevier Patient Education  2021 [employer].

## 2020-05-04 MED ORDER — METFORMIN HCL ER 500 MG PO TB24: 1000.0000 mg | ORAL_TABLET | Freq: Every day | ORAL | 1 refills | Status: DC

## 2020-05-04 MED ORDER — DULOXETINE HCL 20 MG PO CPEP: 20.0000 mg | ORAL_CAPSULE | Freq: Every day | ORAL | 1 refills | Status: DC

## 2020-05-04 NOTE — Progress Notes (Signed)
Encounter details: @CCMTIMESPENT @ @CCMDECISION @ CCM Services: This encounter meets complex CCM services and moderate to high decision making.  I have personally reviewed this encounter including the documentation in this note and have collaborated with the care management provider regarding care management and care coordination activities to include development and update of the comprehensive care plan. I am certifying that I agree with the content of this note and encounter as supervising physician.

## 2020-05-04 NOTE — Telephone Encounter (Signed)
Noted. Thank you cymbalta 20mg  sent to start one daily Metformin XR 500mg  sent to start 1000mg  daily in am.

## 2020-05-04 NOTE — Progress Notes (Signed)
.  Encounter details: CCM Time Spent       Value Time User   Time spent with patient (minutes)  140 05/03/2020  4:02 PM [name], [facility]   Time spent performing Chart review  30 05/03/2020  3:59 PM [name], [facility]   Total time (minutes)  170 05/03/2020  4:02 PM [name], RPH      Moderate to High Complex Decision Making       Value Time User   Moderate to High complex decision making  Yes 05/03/2020  3:59 PM [name], [facility]      CCM Services: This encounter meets complex CCM services and moderate to high decision making.  I have personally reviewed this encounter including the documentation in this note and have collaborated with the care management provider regarding care management and care coordination activities to include development and update of the comprehensive care plan. I am certifying that I agree with the content of this note and encounter as supervising physician.

## 2020-05-09 ENCOUNTER — Telehealth: Payer: Self-pay

## 2020-05-09 NOTE — Chronic Care Management (AMB) (Addendum)
Chronic Care Management Pharmacy Assistant   Name: Maria Fox  MRN: [id number] DOB: 11/17/1945  Reason for Encounter: Medication Adherence and Delivery Coordination  Patient Questions:  1.  Have you seen any other providers since your last visit? No  2.  Any changes in your medicines or health? No    PCP : [name], MD  Allergies:   Allergies  Allergen Reactions   Bee Venom     Throat swelling   Mushroom Extract Complex Anaphylaxis   Penicillins Anaphylaxis, Hives and Swelling    Has patient had a PCN reaction causing immediate rash, facial/tongue/throat swelling, SOB or lightheadedness with hypotension: Yes Has patient had a PCN reaction causing severe rash involving mucus membranes or skin necrosis: Yes Has patient had a PCN reaction that required hospitalization Yes Has patient had a PCN reaction occurring within the last 10 years: No If all of the above answers are "NO", then may proceed with Cephalosporin use.    Codeine Nausea And Vomiting   Sulfa Antibiotics Nausea And Vomiting   Iohexol    Erythromycin Base Rash    Medications: Outpatient Encounter Medications as of 05/09/2020  Medication Sig   acetaminophen (TYLENOL) 500 MG tablet Take 500 mg by mouth as needed.   albuterol (PROAIR HFA) 108 (90 Base) MCG/ACT inhaler Inhale 2 puffs into the lungs every 6 (six) hours as needed for wheezing or shortness of breath.   apixaban (ELIQUIS) 5 MG TABS tablet Take 1 tablet (5 mg total) by mouth 2 (two) times daily.   atorvastatin (LIPITOR) 40 MG tablet TAKE 1 TABLET BY MOUTH EVERYDAY AT BEDTIME   Blood Glucose Monitoring Suppl (ONE TOUCH ULTRA 2) w/Device KIT Use as instructed to check blood sugar 2 times daily as needed. (Patient not taking: Reported on 05/02/2020)   clopidogrel (PLAVIX) 75 MG tablet Take 1 tablet (75 mg total) by mouth daily.   Cyanocobalamin (VITAMIN B12) 500 MCG TABS Take 2 tablets by mouth daily.   dapagliflozin propanediol (FARXIGA) 5 MG  TABS tablet Take 1 tablet (5 mg total) by mouth daily before breakfast.   docusate sodium (COLACE) 100 MG capsule Take 1 capsule (100 mg total) by mouth daily.   DULoxetine (CYMBALTA) 20 MG capsule Take 1 capsule (20 mg total) by mouth daily.   furosemide (LASIX) 20 MG tablet Take 1 tablet (20 mg total) by mouth daily.   gabapentin (NEURONTIN) 300 MG capsule Take 1 capsule (300 mg total) by mouth 2 (two) times daily.   glipiZIDE (GLUCOTROL) 5 MG tablet TAKE 1 TABLET (5 MG TOTAL) BY MOUTH 2 (TWO) TIMES DAILY BEFORE A MEAL.   glucose blood (ONETOUCH ULTRA) test strip Use as instructed to check blood sugar 2 times daily as needed   insulin glargine (LANTUS SOLOSTAR) 100 UNIT/ML Solostar Pen Inject 10 Units into the skin daily. (Patient not taking: Reported on 05/02/2020)   isosorbide mononitrate (IMDUR) 30 MG 24 hr tablet Take 0.5 tablets (15 mg total) by mouth daily.   latanoprost (XALATAN) 0.005 % ophthalmic solution Place 1 drop into both eyes at bedtime.    metFORMIN (GLUCOPHAGE XR) 500 MG 24 hr tablet Take 2 tablets (1,000 mg total) by mouth daily with breakfast.   metoCLOPramide (REGLAN) 10 MG tablet Take 1 tablet (10 mg total) by mouth every 6 (six) hours as needed for nausea or vomiting.   metoprolol succinate (TOPROL XL) 25 MG 24 hr tablet Take 0.5 tablets (12.5 mg total) by mouth daily.   ondansetron [facility]  ODT) 4 MG disintegrating tablet Take 1 tablet (4 mg total) by mouth every 8 (eight) hours as needed for nausea or vomiting.   OneTouch Delica Lancets 35W MISC Use as directed to check blood sugar 2 times daily as needed   pantoprazole (PROTONIX) 40 MG tablet Take 1 tablet (40 mg total) by mouth 2 (two) times daily.   Semaglutide,0.25 or 0.5MG/DOS, (OZEMPIC, 0.25 OR 0.5 MG/DOSE,) 2 MG/1.5ML SOPN Inject 0.25 mg into the skin once a week for 14 days, THEN 0.5 mg once a week.   No facility-administered encounter medications on file as of 05/09/2020.    Current Diagnosis: Patient Active  Problem List   Diagnosis Date Noted   Low-tension glaucoma, bilateral, severe stage 03/24/2020   Dysuria 11/08/2019   Right sided temporal headache 08/06/2019   Atrial flutter with rapid ventricular response ([name])    Acute deep vein thrombosis (DVT) of left tibial vein ([place]) 07/11/2019   Acute cholecystitis 07/07/2019   Pain of right breast 03/15/2019   Health maintenance examination 12/11/2018   Type 2 diabetes mellitus with severe nonproliferative diabetic retinopathy with macular edema, bilateral ([place]) 07/16/2018   Coronary artery disease of native artery of native heart with stable angina pectoris ([name])    Effort angina ([place]) 11/12/2017   Abnormal stress ECG    Dyspnea on exertion 11/04/2017   GERD (gastroesophageal reflux disease) 08/15/2017   Trigger finger 06/09/2017   Nocturnal leg movements 03/25/2017   Osteopenia 02/01/2017   Advanced care planning/counseling discussion 01/02/2017   Stage 3 chronic kidney disease due to type 2 diabetes mellitus ([place]) 08/26/2016   Insomnia 08/26/2016   Vitamin B12 deficiency 11/10/2015   General weakness 08/04/2015   Epistaxis 07/07/2015   Fatty liver disease, nonalcoholic 65/68/1275   Chronic diastolic CHF (congestive heart failure) ([place]) 08/27/2014   Positive hepatitis C antibody test 08/27/2014   Iron deficiency 08/27/2014   NSVT (nonsustained ventricular tachycardia) ([name])    Nausea without vomiting    Autonomic orthostatic hypotension 07/11/2014   S/P CABG x 3 07/08/2014   History of cerebrovascular accident (CVA) in adulthood 07/08/2014   DM (diabetes mellitus), type 2, uncontrolled w/neurologic complication ([place]) 17/00/1749   Itching 03/16/2012   Rash 03/16/2012   Medicare annual wellness visit, initial 10/28/2011   Hyperlipidemia associated with type 2 diabetes mellitus ([place])    Idiopathic angioedema 06/23/2011   Vitiligo 06/23/2011   Dermatitis 06/23/2011   MDD (major depressive disorder), recurrent severe, without  psychosis ([place]) 05/08/2006   Hypertension, essential 05/08/2006   MENOPAUSAL SYNDROME 05/08/2006   Reviewed chart for medication changes ahead of medication coordination call. Medication changes this month - per CPP visit, start Ozempic and Cymbalta.   BP Readings from Last 3 Encounters:  03/29/20 (!) 141/85  03/28/20 129/69  01/28/20 104/74    Lab Results  Component Value Date   HGBA1C 12.2 (H) 05/02/2020    Patient obtains medications through Adherence Packaging  30 Days - starting adherence packaging this month, prior on vials  Last adherence delivery included:  04/17/20 Atorvastatin 40 mg - 1 tablet daily (bedtime) Farxiga 5 mg - 1 tablet daily Furosemide 20 mg- 1 tablet daily Gabapentin 300 mg- 1 tablet twice daily  Glipizide 5 mg - 1 table twice daily Metformin 500 mg- 1 tablet three times a day before meals Metoprolol succinate 25 mg 0.5 tablet daily (due 04/30/20 - please take this due date into account when syncing for 05/21/20) Pantoprazole 40 mg - 1  Tablet daily  Patient declined  the following last month: Basaglar- Inject 10 units sq daily - 04/10/20 states she has not started this. - 90 DS on 03/20/20 Cephalexin 500 mg- completed course Clopidogrel 75 mg - 1 tablet daily - 90 DS 02/17/20 - due 05/21/20 Eliquis 5 mg- 1 tablet twice daily - 90 DS 02/17/20 - due 05/21/20 Gabapentin 100 mg - 1 tablet three times daily-  DISCONTINUED Hydrocodone 5/325 - Finished course. Isosorbide Mononitrate 30 mg - 0.5 tablet daily (breakfast)- 90 DS 02/18/20 - due 05/21/20 Lantus Inj Solostar- Inject 10 units into the skin daily- Patient states she has not started this as of 04/10/20 Latanoprost 0.005 % eye drops- 1 drop each eye daily- 75 DS on 03/15/20 - due 05/30/20 Nitrofurantoin - Completed course Ondansetron 4 mg- Completed course  OneTouch test strips and lancets due to having a supplies on hand but patient is not check blood sugars at this time. Trazodone 50 mg- 1 and 1/2 tablet nightly  due to PRN use - PRN  Patient is due for next adherence delivery on: 05/19/20  Called patient and reviewed medications and coordinated delivery.  This delivery to include: 30 DS adherence packaging Atorvastatin 40 mg - 1 tablet daily (1 breakfast) Clopidogrel 75 mg - 1 tablet daily (1 breakfast) Duloxetine 20 mg - 1 capsule daily (1 breakfast) Eliquis 5 mg- 1 tablet twice daily (1 breakfast, 1 evening meal) Farxiga 5 mg - 1 tablet daily (1 breakfast) Furosemide 20 mg- 1 tablet daily (1 breakfast) Gabapentin 300 mg- 1 tablet twice daily (1 breakfast, 1 evening meal) Glipizide 5 mg - 1 tablet twice daily (1 breakfast, 1 evening meal) Isosorbide Mononitrate 30 mg - 1/2 tablet daily (1/2 breakfast) Metformin 500 mg ER - 2 tablets at breakfast (2 breakfast) Metoprolol succinate 25 mg - 1/2 tablet daily (1/2 breakfast) Pantoprazole 40 mg - 1 tablet daily (1 breakfast)  VIAL/Other: Latanoprost 0.005 % eye drops- 1 drop each eye daily Albuterol inhaler PRN Ondansetron 4 mg PRN OneTouch test strips One Touch lancets   Patient declined the following medications  Ozempic 2/1.5- Inject 0.25 mg into skin once a week, filled on 04/21/20 but patient has not started taking due to anxiety. Trazodone 50 mg- 1 and 1/2 tablet nightly due to PRN use (not taking, sleeps well)  Patient needs refills for: Eliquis 5 mg- Dr. Normand Sloop 5 mg- Dr. [name] Latanoprost eye drops- Dr. Katy Fitch Ondansetron 16m- Dr. GDanise Mina Confirmed delivery date of 05/19/20, advised patient that pharmacy will contact them the morning of delivery.   Pt reports she has not started Ozempic at this time. She states she is going to start today 05/12/20. She is aware she is starting packing medications this month. She has not checked her blood pressure or blood sugar. States she has enough medications to last until 05/21/20.  Follow-Up:  Coordination of Enhanced Pharmacy Services and Pharmacist Review  MDebbora Dus CPP  notified  LMargaretmary Dys [phone] Total time spent for month: 72  I have reviewed the care management and care coordination activities outlined in this encounter and I am certifying that I agree with the content of this note. Reviewed and updated timing of adherence packaging for appropriateness/ease of administration.   MDebbora Dus PharmD Clinical Pharmacist [phone]

## 2020-05-10 ENCOUNTER — Telehealth: Payer: Self-pay

## 2020-05-10 ENCOUNTER — Ambulatory Visit: Payer: Medicare HMO | Admitting: Podiatry

## 2020-05-10 MED ORDER — ALBUTEROL SULFATE HFA 108 (90 BASE) MCG/ACT IN AERS: 2.0000 | INHALATION_SPRAY | Freq: Four times a day (QID) | RESPIRATORY_TRACT | 0 refills | Status: DC | PRN

## 2020-05-10 NOTE — Telephone Encounter (Signed)
E-scribed refill 

## 2020-05-12 ENCOUNTER — Other Ambulatory Visit: Payer: Self-pay

## 2020-05-12 DIAGNOSIS — E785 Hyperlipidemia, unspecified: Secondary | ICD-10-CM

## 2020-05-12 NOTE — Telephone Encounter (Signed)
Eliquis last rx:  01/18/20, #184 Farxiga last rx:  04/10/20, #30 Zofran last rx:  03/27/20, #10  Last OV:  11/08/19, 3 mo f/u Next OV:  05/24/20, DM f/u

## 2020-05-12 NOTE — Telephone Encounter (Signed)
-----   Message from [place], [facility] sent at 05/12/2020  9:40 AM EST ----- Pt needs refills on Farxiga, Eliquis, and Zofran to [facility].  Thanks!  [name], PharmD Clinical Pharmacist [facility] at [facility] [phone]

## 2020-05-13 MED ORDER — DAPAGLIFLOZIN PROPANEDIOL 5 MG PO TABS: 5.0000 mg | ORAL_TABLET | Freq: Every day | ORAL | 2 refills | Status: DC

## 2020-05-13 MED ORDER — APIXABAN 5 MG PO TABS: 5.0000 mg | ORAL_TABLET | Freq: Two times a day (BID) | ORAL | 0 refills | Status: DC

## 2020-05-13 MED ORDER — ONDANSETRON 4 MG PO TBDP: 4.0000 mg | ORAL_TABLET | Freq: Three times a day (TID) | ORAL | 0 refills | Status: DC | PRN

## 2020-05-13 NOTE — Telephone Encounter (Signed)
ERx 

## 2020-05-22 ENCOUNTER — Ambulatory Visit (INDEPENDENT_AMBULATORY_CARE_PROVIDER_SITE_OTHER): Payer: Medicare Other | Admitting: Podiatry

## 2020-05-22 ENCOUNTER — Encounter: Payer: Self-pay | Admitting: Podiatry

## 2020-05-22 ENCOUNTER — Other Ambulatory Visit: Payer: Self-pay

## 2020-05-22 DIAGNOSIS — B351 Tinea unguium: Secondary | ICD-10-CM

## 2020-05-22 DIAGNOSIS — M79674 Pain in right toe(s): Secondary | ICD-10-CM

## 2020-05-22 DIAGNOSIS — M79675 Pain in left toe(s): Secondary | ICD-10-CM

## 2020-05-22 DIAGNOSIS — E0865 Diabetes mellitus due to underlying condition with hyperglycemia: Secondary | ICD-10-CM

## 2020-05-22 NOTE — Progress Notes (Signed)
Subjective: Maria Fox is a 75 y.o. y.o. female with h/o diabetes who presents today for preventative diabetic foot care. Patient has painful, elongated mycotic toenails which interfere with daily activities. Pain is aggravated when wearing enclosed shoe gear and relieved with periodic professional debridement.  She states she attempted to cut her toenails and trimmed a couple of them too short. Denies any redness, drainage, or swelling.   [name], MD is patient's PCP. Last visit was 11/08/2019.  Allergies  Allergen Reactions   Bee Venom     Throat swelling   Mushroom Extract Complex Anaphylaxis   Penicillins Anaphylaxis, Hives and Swelling    Has patient had a PCN reaction causing immediate rash, facial/tongue/throat swelling, SOB or lightheadedness with hypotension: Yes Has patient had a PCN reaction causing severe rash involving mucus membranes or skin necrosis: Yes Has patient had a PCN reaction that required hospitalization Yes Has patient had a PCN reaction occurring within the last 10 years: No If all of the above answers are "NO", then may proceed with Cephalosporin use.    Codeine Nausea And Vomiting   Sulfa Antibiotics Nausea And Vomiting   Iohexol    Erythromycin Base Rash   Objective: There were no vitals filed for this visit.  Vascular Examination: Capillary refill time <3 seconds  b/l.  Dorsalis pedis and posterior tibial pulses palpable b/l.  Digital hair sparse b/l.  Skin temperature gradient WNL b/l.  Dermatological Examination: Skin with normal turgor, texture and tone b/l.  Toenails 2-5 b/l discolored, thick, dystrophic with subungual debris and pain with palpation to nailbeds due to thickness of nails. No erythema, no edema, no drainage, no fluctuance.  Anonychia b/l great toes.   Depigmentation noted different areas of b/l feet consistent with vitiligo.  Musculoskeletal: Muscle strength 5/5 to all LE muscle groups  b/l.  Neurological: Sensation intact 5/5 b/l with 10 gram monofilament.  Vibratory sensation intact  b/l.  Last A1c:  Hemoglobin A1C Latest Ref Rng & Units 05/02/2020 11/08/2019 08/03/2019  HGBA1C 4.6 - 6.5 % 12.2(H) 11.2(A) 10.8(H)  Some recent data might be hidden   Assessment: 1. Painful onychomycosis toenails 2-5 b/l 2.   NIDDM, uncontrolled  Plan: 1. Continue diabetic foot care principles.  2. Toenails 2-5 b/l were debrided in length and girth without iatrogenic bleeding.  3. Patient to continue soft, supportive shoe gear daily. 4. Patient to report any pedal injuries to medical professional immediately. 5. Follow up 4 months per patient request.  6. Patient/POA to call should there be a concern in the interim.

## 2020-05-24 ENCOUNTER — Ambulatory Visit: Payer: Medicare Other | Admitting: Family Medicine

## 2020-06-06 ENCOUNTER — Other Ambulatory Visit: Payer: Self-pay

## 2020-06-06 ENCOUNTER — Ambulatory Visit (INDEPENDENT_AMBULATORY_CARE_PROVIDER_SITE_OTHER): Payer: Medicare Other

## 2020-06-06 DIAGNOSIS — Z0000 Encounter for general adult medical examination without abnormal findings: Secondary | ICD-10-CM | POA: Diagnosis not present

## 2020-06-06 NOTE — Progress Notes (Signed)
PCP notes:  Health Maintenance: Flu- due Covid- due Mammo- due Dexa- due    Abnormal Screenings: none   Patient concerns: none   Nurse concerns: none   Next PCP appt.: 06/13/2020 @ 3:30 pm
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
## 2020-06-06 NOTE — Patient Instructions (Signed)
Maria Fox , Thank you for taking time to come for your Medicare Wellness Visit. I appreciate your ongoing commitment to your health goals. Please review the following plan we discussed and let me know if I can assist you in the future.   Screening recommendations/referrals: Colonoscopy: Up to date, completed 04/08/2019, no longer required  Mammogram: due, will discuss with provider at physical Bone Density: due, will discuss with provider at physical Recommended yearly ophthalmology/optometry visit for glaucoma screening and checkup Recommended yearly dental visit for hygiene and checkup  Vaccinations: Influenza vaccine: due, will discuss with provider at physical  Pneumococcal vaccine: Completed series Tdap vaccine: decline-insurance  Shingles vaccine: due, check with your insurance regarding coverage if interested    Covid-19:completed 1 vaccine, Patient aware she is due for second dose.   Advanced directives: Advance directive discussed with you today. Even though you declined this today please call our office should you change your mind and we can give you the proper paperwork for you to fill out.  Conditions/risks identified: diabetes, hypertension, hyperlipidemia   Next appointment: Follow up in one year for your annual wellness visit    Preventive Care 63 Years and Older, Female Preventive care refers to lifestyle choices and visits with your health care provider that can promote health and wellness. What does preventive care include?  A yearly physical exam. This is also called an annual well check.  Dental exams once or twice a year.  Routine eye exams. Ask your health care provider how often you should have your eyes checked.  Personal lifestyle choices, including:  Daily care of your teeth and gums.  Regular physical activity.  Eating a healthy diet.  Avoiding tobacco and drug use.  Limiting alcohol use.  Practicing safe sex.  Taking low-dose aspirin every  day.  Taking vitamin and mineral supplements as recommended by your health care provider. What happens during an annual well check? The services and screenings done by your health care provider during your annual well check will depend on your age, overall health, lifestyle risk factors, and family history of disease. Counseling  Your health care provider may ask you questions about your:  Alcohol use.  Tobacco use.  Drug use.  Emotional well-being.  Home and relationship well-being.  Sexual activity.  Eating habits.  History of falls.  Memory and ability to understand (cognition).  Work and work Statistician.  Reproductive health. Screening  You may have the following tests or measurements:  Height, weight, and BMI.  Blood pressure.  Lipid and cholesterol levels. These may be checked every 5 years, or more frequently if you are over 29 years old.  Skin check.  Lung cancer screening. You may have this screening every year starting at age 46 if you have a 30-pack-year history of smoking and currently smoke or have quit within the past 15 years.  Fecal occult blood test (FOBT) of the stool. You may have this test every year starting at age 65.  Flexible sigmoidoscopy or colonoscopy. You may have a sigmoidoscopy every 5 years or a colonoscopy every 10 years starting at age 12.  Hepatitis C blood test.  Hepatitis B blood test.  Sexually transmitted disease (STD) testing.  Diabetes screening. This is done by checking your blood sugar (glucose) after you have not eaten for a while (fasting). You may have this done every 1-3 years.  Bone density scan. This is done to screen for osteoporosis. You may have this done starting at age 58.  Mammogram.  This may be done every 1-2 years. Talk to your health care provider about how often you should have regular mammograms. Talk with your health care provider about your test results, treatment options, and if necessary, the need  for more tests. Vaccines  Your health care provider may recommend certain vaccines, such as:  Influenza vaccine. This is recommended every year.  Tetanus, diphtheria, and acellular pertussis (Tdap, Td) vaccine. You may need a Td booster every 10 years.  Zoster vaccine. You may need this after age 26.  Pneumococcal 13-valent conjugate (PCV13) vaccine. One dose is recommended after age 67.  Pneumococcal polysaccharide (PPSV23) vaccine. One dose is recommended after age 64. Talk to your health care provider about which screenings and vaccines you need and how often you need them. This information is not intended to replace advice given to you by your health care provider. Make sure you discuss any questions you have with your health care provider. Document Released: 03/24/2015 Document Revised: 11/15/2015 Document Reviewed: 12/27/2014 Elsevier Interactive Patient Education  2017 [employer] Prevention in the Home Falls can cause injuries. They can happen to people of all ages. There are many things you can do to make your home safe and to help prevent falls. What can I do on the outside of my home?  Regularly fix the edges of walkways and driveways and fix any cracks.  Remove anything that might make you trip as you walk through a door, such as a raised step or threshold.  Trim any bushes or trees on the path to your home.  Use bright outdoor lighting.  Clear any walking paths of anything that might make someone trip, such as rocks or tools.  Regularly check to see if handrails are loose or broken. Make sure that both sides of any steps have handrails.  Any raised decks and porches should have guardrails on the edges.  Have any leaves, snow, or ice cleared regularly.  Use sand or salt on walking paths during winter.  Clean up any spills in your garage right away. This includes oil or grease spills. What can I do in the bathroom?  Use night lights.  Install grab bars  by the toilet and in the tub and shower. Do not use towel bars as grab bars.  Use non-skid mats or decals in the tub or shower.  If you need to sit down in the shower, use a plastic, non-slip stool.  Keep the floor dry. Clean up any water that spills on the floor as soon as it happens.  Remove soap buildup in the tub or shower regularly.  Attach bath mats securely with double-sided non-slip rug tape.  Do not have throw rugs and other things on the floor that can make you trip. What can I do in the bedroom?  Use night lights.  Make sure that you have a light by your bed that is easy to reach.  Do not use any sheets or blankets that are too big for your bed. They should not hang down onto the floor.  Have a firm chair that has side arms. You can use this for support while you get dressed.  Do not have throw rugs and other things on the floor that can make you trip. What can I do in the kitchen?  Clean up any spills right away.  Avoid walking on wet floors.  Keep items that you use a lot in easy-to-reach places.  If you need to reach something  above you, use a strong step stool that has a grab bar.  Keep electrical cords out of the way.  Do not use floor polish or wax that makes floors slippery. If you must use wax, use non-skid floor wax.  Do not have throw rugs and other things on the floor that can make you trip. What can I do with my stairs?  Do not leave any items on the stairs.  Make sure that there are handrails on both sides of the stairs and use them. Fix handrails that are broken or loose. Make sure that handrails are as long as the stairways.  Check any carpeting to make sure that it is firmly attached to the stairs. Fix any carpet that is loose or worn.  Avoid having throw rugs at the top or bottom of the stairs. If you do have throw rugs, attach them to the floor with carpet tape.  Make sure that you have a light switch at the top of the stairs and the  bottom of the stairs. If you do not have them, ask someone to add them for you. What else can I do to help prevent falls?  Wear shoes that:  Do not have high heels.  Have rubber bottoms.  Are comfortable and fit you well.  Are closed at the toe. Do not wear sandals.  If you use a stepladder:  Make sure that it is fully opened. Do not climb a closed stepladder.  Make sure that both sides of the stepladder are locked into place.  Ask someone to hold it for you, if possible.  Clearly mark and make sure that you can see:  Any grab bars or handrails.  First and last steps.  Where the edge of each step is.  Use tools that help you move around (mobility aids) if they are needed. These include:  Canes.  Walkers.  Scooters.  Crutches.  Turn on the lights when you go into a dark area. Replace any light bulbs as soon as they burn out.  Set up your furniture so you have a clear path. Avoid moving your furniture around.  If any of your floors are uneven, fix them.  If there are any pets around you, be aware of where they are.  Review your medicines with your doctor. Some medicines can make you feel dizzy. This can increase your chance of falling. Ask your doctor what other things that you can do to help prevent falls. This information is not intended to replace advice given to you by your health care provider. Make sure you discuss any questions you have with your health care provider. Document Released: 12/22/2008 Document Revised: 08/03/2015 Document Reviewed: 04/01/2014 Elsevier Interactive Patient Education  2017 [employer].

## 2020-06-07 ENCOUNTER — Other Ambulatory Visit: Payer: Self-pay | Admitting: Family Medicine

## 2020-06-07 ENCOUNTER — Other Ambulatory Visit: Payer: Medicare Other

## 2020-06-09 ENCOUNTER — Telehealth: Payer: Self-pay

## 2020-06-09 DIAGNOSIS — E785 Hyperlipidemia, unspecified: Secondary | ICD-10-CM

## 2020-06-09 MED ORDER — FUROSEMIDE 20 MG PO TABS: 20.0000 mg | ORAL_TABLET | Freq: Every day | ORAL | 0 refills | Status: DC

## 2020-06-09 MED ORDER — ATORVASTATIN CALCIUM 40 MG PO TABS: ORAL_TABLET | ORAL | 0 refills | Status: DC

## 2020-06-09 MED ORDER — GLIPIZIDE 5 MG PO TABS: ORAL_TABLET | ORAL | 0 refills | Status: DC

## 2020-06-09 NOTE — Chronic Care Management (AMB) (Addendum)
Chronic Care Management Pharmacy Assistant   Name: ZYRAH WISWELL  MRN: [id number] DOB: 04-08-1945  Reason for Encounter:  Medication adherence and delivery coordination    Recent office visits:  No PCP visits since last CCM contact  Recent consult visits:  05/22/2020  [facility] visits:  Medication Reconciliation was completed by comparing discharge summary, patient's EMR and Pharmacy list, and upon discussion with patient. Admitted to the hospital on 03/27/2020  Due to Covid-19 Discharge date was 03/28/2020 . Discharged from  [place]?Medications Started at [facility] Discharge:?? -Started short term  Cephalexin 500 mg Oral 2 times daily and Ondansetron 4 mg Oral Every 8 hours PRN  Medication Changes at Hospital Discharge: no changes,   Medications Discontinued at [facility] Discharge:, no medications discontinued  Medications that remain the same after Hospital Discharge:??  -All other medications will remain the same.     Medications: Outpatient Encounter Medications as of 06/09/2020  Medication Sig   acetaminophen (TYLENOL) 500 MG tablet Take 500 mg by mouth as needed.   albuterol (PROAIR HFA) 108 (90 Base) MCG/ACT inhaler Inhale 2 puffs into the lungs every 6 (six) hours as needed for wheezing or shortness of breath.   apixaban (ELIQUIS) 5 MG TABS tablet Take 1 tablet (5 mg total) by mouth 2 (two) times daily.   atorvastatin (LIPITOR) 40 MG tablet TAKE 1 TABLET BY MOUTH EVERYDAY AT BEDTIME   Blood Glucose Monitoring Suppl (ONE TOUCH ULTRA 2) w/Device KIT Use as instructed to check blood sugar 2 times daily as needed.   clopidogrel (PLAVIX) 75 MG tablet Take 1 tablet (75 mg total) by mouth daily.   Cyanocobalamin (VITAMIN B12) 500 MCG TABS Take 2 tablets by mouth daily.   dapagliflozin propanediol (FARXIGA) 5 MG TABS tablet Take 1 tablet (5 mg total) by mouth daily before breakfast.   docusate sodium (COLACE) 100 MG capsule Take 1 capsule (100 mg  total) by mouth daily.   DULoxetine (CYMBALTA) 20 MG capsule Take 1 capsule (20 mg total) by mouth daily.   furosemide (LASIX) 20 MG tablet Take 1 tablet (20 mg total) by mouth daily.   gabapentin (NEURONTIN) 100 MG capsule Take 100 mg by mouth 3 (three) times daily.   gabapentin (NEURONTIN) 300 MG capsule Take 1 capsule (300 mg total) by mouth 2 (two) times daily.   glipiZIDE (GLUCOTROL) 5 MG tablet TAKE 1 TABLET (5 MG TOTAL) BY MOUTH 2 (TWO) TIMES DAILY BEFORE A MEAL.   glucose blood (ONETOUCH ULTRA) test strip Use as instructed to check blood sugar 2 times daily as needed   insulin glargine (LANTUS SOLOSTAR) 100 UNIT/ML Solostar Pen Inject 10 Units into the skin daily.   isosorbide mononitrate (IMDUR) 30 MG 24 hr tablet Take 0.5 tablets (15 mg total) by mouth daily.   latanoprost (XALATAN) 0.005 % ophthalmic solution Place 1 drop into both eyes at bedtime.    metFORMIN (GLUCOPHAGE XR) 500 MG 24 hr tablet Take 2 tablets (1,000 mg total) by mouth daily with breakfast.   metoCLOPramide (REGLAN) 10 MG tablet Take 1 tablet (10 mg total) by mouth every 6 (six) hours as needed for nausea or vomiting.   metoprolol succinate (TOPROL XL) 25 MG 24 hr tablet Take 0.5 tablets (12.5 mg total) by mouth daily.   ondansetron (ZOFRAN ODT) 4 MG disintegrating tablet Take 1 tablet (4 mg total) by mouth every 8 (eight) hours as needed for nausea or vomiting.   OneTouch Delica Lancets  33G MISC Use as directed to check blood sugar 2 times daily as needed   pantoprazole (PROTONIX) 20 MG tablet Take 20 mg by mouth daily.   pantoprazole (PROTONIX) 40 MG tablet TAKE ONE TABLET BY MOUTH TWICE DAILY   No facility-administered encounter medications on file as of 06/09/2020.    Reviewed chart for medication changes ahead of medication coordination call.  OVs, Consults, or hospital visits since last care coordination call/Pharmacist visit.  05/22/2020  Acquanetta Sit DPM  03/27/2020  ED visit  Due to Covid 19  No  medication changes indicated   BP Readings from Last 3 Encounters:  03/29/20 (!) 141/85  03/28/20 129/69  01/28/20 104/74    Lab Results  Component Value Date   HGBA1C 12.2 (H) 05/02/2020     Patient obtains medications through Adherence Packaging  30 Days   Last adherence delivery date:05/19/2020   Patient is due for next adherence delivery on 06/16/2020  Called patient and reviewed medications and coordinated delivery  06/09/2020 This delivery to include: Packs 30 DS Atorvastatin 40 mg - 1 tablet daily (1 breakfast) Clopidogrel 75 mg - 1 tablet daily (1 breakfast) Duloxetine 20 mg - 1 capsule daily (1 breakfast) Eliquis 5 mg- 1 tablet twice daily (1 breakfast, 1 evening meal) Farxiga 5 mg - 1 tablet daily (1 breakfast) Furosemide 20 mg- 1 tablet daily (1 breakfast) Gabapentin 300 mg- 1 tablet twice daily (1 breakfast, 1 evening meal) Glipizide 5 mg - 1 tablet twice daily (1 breakfast, 1 evening meal) Isosorbide Mononitrate 30 mg - 1/2 tablet daily (1/2 breakfast) Metformin 500 mg ER - 2 tablets at breakfast (2 breakfast) Metoprolol succinate 25 mg - 1/2 tablet daily (1/2 breakfast) Pantoprazole 40 mg - 1 tablet daily (1 breakfast)  Vials- PRN  Latanoprost 0.005 % eye drops- 1 drop each eye daily   Patient declined the following medications  Albuterol inhaler PRN Ondansetron 4 mg PRN OneTouch test strips  Pt. States she is not taking BG One Touch lancets  Pt. States she is not taking BG Basaglar- Inject 10 units sq daily - 04/10/20, pt never started this   Gabapentin 100 mg - 1 tablet three times daily-  DISCONTINUED Hydrocodone 5/325 - Finished course. Nitrofurantoin - Completed course  Adherence/not taking: Ozempic 2/1.5- Inject 0.25 mg into skin once a week Trazodone 50 mg- 1 and 1/2 tablet nightly due to PRN use - States she does not take, sleeping well    Patient needs refills for Atorvastatin 40 mg Glipizide 5 mg Furosemide 20 mg Above medications have been  requested from [facility].   Confirmed delivery date of 06/16/2020 , advised patient that pharmacy will contact them the morning of delivery.  Patient states she is not checking her blood sugar or blood pressure at this time. Not taking Ozempic as prescribed. She has a PCP visit scheduled for 06/13/20 which she plans to discuss diabetes treatment.  Follow-Up:  Coordination of Enhanced Pharmacy Services and Pharmacist Review  [name], CPP notified  Margaretmary Dys, [place] Pharmacy Assistant [phone]  I have reviewed the care management and care coordination activities outlined in this encounter and I am certifying that I agree with the content of this note. Pt continues to report she will start Ozempic "soon" but has some reservations about starting an injection. PCP visit tomorrow.   [name], PharmD Clinical Pharmacist [facility] [phone]

## 2020-06-09 NOTE — Telephone Encounter (Signed)
E-scribed refills.  

## 2020-06-09 NOTE — Telephone Encounter (Signed)
-----   Message from [place] sent at 06/09/2020 11:48 AM EDT ----- Regarding: refill Pt. Needs refills for the following medications  please send to [facility] if approved.   atorvastatin 40 mg  glipizide 5 mg  furosemide 20 mg

## 2020-06-13 ENCOUNTER — Other Ambulatory Visit: Payer: Self-pay

## 2020-06-13 ENCOUNTER — Ambulatory Visit (INDEPENDENT_AMBULATORY_CARE_PROVIDER_SITE_OTHER): Payer: Medicare Other | Admitting: Family Medicine

## 2020-06-13 ENCOUNTER — Encounter: Payer: Self-pay | Admitting: Family Medicine

## 2020-06-13 VITALS — BP 120/76 | HR 91 | Temp 97.6°F | Ht 62.5 in | Wt 141.3 lb

## 2020-06-13 DIAGNOSIS — E611 Iron deficiency: Secondary | ICD-10-CM

## 2020-06-13 DIAGNOSIS — Z8673 Personal history of transient ischemic attack (TIA), and cerebral infarction without residual deficits: Secondary | ICD-10-CM | POA: Diagnosis not present

## 2020-06-13 DIAGNOSIS — N183 Chronic kidney disease, stage 3 unspecified: Secondary | ICD-10-CM

## 2020-06-13 DIAGNOSIS — E1165 Type 2 diabetes mellitus with hyperglycemia: Secondary | ICD-10-CM

## 2020-06-13 DIAGNOSIS — F332 Major depressive disorder, recurrent severe without psychotic features: Secondary | ICD-10-CM

## 2020-06-13 DIAGNOSIS — R300 Dysuria: Secondary | ICD-10-CM | POA: Diagnosis not present

## 2020-06-13 DIAGNOSIS — E1149 Type 2 diabetes mellitus with other diabetic neurological complication: Secondary | ICD-10-CM | POA: Diagnosis not present

## 2020-06-13 DIAGNOSIS — E785 Hyperlipidemia, unspecified: Secondary | ICD-10-CM

## 2020-06-13 DIAGNOSIS — I5032 Chronic diastolic (congestive) heart failure: Secondary | ICD-10-CM

## 2020-06-13 DIAGNOSIS — Z0000 Encounter for general adult medical examination without abnormal findings: Secondary | ICD-10-CM

## 2020-06-13 DIAGNOSIS — K219 Gastro-esophageal reflux disease without esophagitis: Secondary | ICD-10-CM

## 2020-06-13 DIAGNOSIS — H401233 Low-tension glaucoma, bilateral, severe stage: Secondary | ICD-10-CM

## 2020-06-13 DIAGNOSIS — E1169 Type 2 diabetes mellitus with other specified complication: Secondary | ICD-10-CM

## 2020-06-13 DIAGNOSIS — I10 Essential (primary) hypertension: Secondary | ICD-10-CM | POA: Diagnosis not present

## 2020-06-13 DIAGNOSIS — E1122 Type 2 diabetes mellitus with diabetic chronic kidney disease: Secondary | ICD-10-CM

## 2020-06-13 DIAGNOSIS — IMO0002 Reserved for concepts with insufficient information to code with codable children: Secondary | ICD-10-CM

## 2020-06-13 DIAGNOSIS — M85859 Other specified disorders of bone density and structure, unspecified thigh: Secondary | ICD-10-CM | POA: Diagnosis not present

## 2020-06-13 DIAGNOSIS — I82442 Acute embolism and thrombosis of left tibial vein: Secondary | ICD-10-CM

## 2020-06-13 DIAGNOSIS — E113413 Type 2 diabetes mellitus with severe nonproliferative diabetic retinopathy with macular edema, bilateral: Secondary | ICD-10-CM

## 2020-06-13 DIAGNOSIS — E538 Deficiency of other specified B group vitamins: Secondary | ICD-10-CM

## 2020-06-13 LAB — POC URINALSYSI DIPSTICK (AUTOMATED)
Bilirubin, UA: NEGATIVE
Blood, UA: NEGATIVE
Glucose, UA: POSITIVE — AB
Ketones, UA: NEGATIVE
Leukocytes, UA: NEGATIVE
Nitrite, UA: NEGATIVE
Protein, UA: NEGATIVE
Spec Grav, UA: 1.015 (ref 1.010–1.025)
Urobilinogen, UA: 0.2 E.U./dL
pH, UA: 5.5 (ref 5.0–8.0)

## 2020-06-13 MED ORDER — PANTOPRAZOLE SODIUM 40 MG PO TBEC: 1.0000 | DELAYED_RELEASE_TABLET | Freq: Two times a day (BID) | ORAL | 8 refills | Status: DC

## 2020-06-13 MED ORDER — APIXABAN 5 MG PO TABS: 5.0000 mg | ORAL_TABLET | Freq: Two times a day (BID) | ORAL | 1 refills | Status: DC

## 2020-06-13 MED ORDER — DAPAGLIFLOZIN PROPANEDIOL 5 MG PO TABS: 5.0000 mg | ORAL_TABLET | Freq: Every day | ORAL | 11 refills | Status: DC

## 2020-06-13 MED ORDER — METFORMIN HCL ER 500 MG PO TB24: 1000.0000 mg | ORAL_TABLET | Freq: Every day | ORAL | 1 refills | Status: DC

## 2020-06-13 MED ORDER — ATORVASTATIN CALCIUM 40 MG PO TABS: ORAL_TABLET | ORAL | 3 refills | Status: DC

## 2020-06-13 MED ORDER — GLIPIZIDE 5 MG PO TABS: ORAL_TABLET | ORAL | 1 refills | Status: DC

## 2020-06-13 MED ORDER — LANTUS SOLOSTAR 100 UNIT/ML ~~LOC~~ SOPN: 10.0000 [IU] | PEN_INJECTOR | Freq: Every day | SUBCUTANEOUS | 2 refills | Status: DC

## 2020-06-13 MED ORDER — FUROSEMIDE 20 MG PO TABS: 20.0000 mg | ORAL_TABLET | Freq: Every day | ORAL | 1 refills | Status: DC

## 2020-06-13 MED ORDER — DULOXETINE HCL 30 MG PO CPEP: 30.0000 mg | ORAL_CAPSULE | Freq: Every day | ORAL | 3 refills | Status: DC

## 2020-06-13 NOTE — Assessment & Plan Note (Signed)
Continue oral b12 replacement.

## 2020-06-13 NOTE — Assessment & Plan Note (Addendum)
Continue plavix, eliquis

## 2020-06-13 NOTE — Assessment & Plan Note (Addendum)
Chronic, stable. Continue lasix and isosorbide and toprol XL low dose.

## 2020-06-13 NOTE — Assessment & Plan Note (Signed)
Consider updated DEXA next year

## 2020-06-13 NOTE — Assessment & Plan Note (Signed)
Encouraged call to schedule f/u.

## 2020-06-13 NOTE — Assessment & Plan Note (Signed)
On plavix + eliquis since 07/2019 (LLE DVT and presumed PE)  Plan was 6 months of eliquis, plavix, then reassess.  She has continued this - will touch base next visit on duration.

## 2020-06-13 NOTE — Assessment & Plan Note (Signed)
UA today normal.

## 2020-06-13 NOTE — Assessment & Plan Note (Signed)
Chronic, deteriorated - not taking insulin, did not try weekly GLP1RA. Reviewed importance of restart daily insulin routine - start 10u daily.

## 2020-06-13 NOTE — Assessment & Plan Note (Signed)
Preventative protocols reviewed and updated unless pt declined. Discussed healthy diet and lifestyle.  

## 2020-06-13 NOTE — Assessment & Plan Note (Signed)
Will continue to monitor this. 

## 2020-06-13 NOTE — Progress Notes (Signed)
Patient ID: Maria Fox, female    DOB: [birth year], 75 y.o.   MRN: [id number]  This visit was conducted in person.  BP 120/76   Pulse 91   Temp 97.6 F (36.4 C) (Temporal)   Ht 5' 2.5" (1.588 m)   Wt 141 lb 5 oz (64.1 kg)   SpO2 97%   BMI 25.43 kg/m    CC: CPE Subjective:   HPI: Maria Fox is a 75 y.o. female presenting on 06/13/2020 for Annual Exam (Prt 2. ) and Dysuria (C/o pain with urination.  Started about 1 wk ago. )   Saw health advisor last month for medicare wellness visit. Note reviewed.   No exam data present  Flowsheet Row Clinical Support from 06/06/2020 in [facility]  PHQ-2 Total Score 4      Fall Risk  06/06/2020 12/08/2018 12/26/2016  Falls in the past year? 0 0 Yes  Comment - - pt slipped on water/oil spot in parking lot of [employer]; bruise to left palm  Number falls in past yr: 0 0 1  Injury with Fall? 0 - Yes  Risk for fall due to : Medication side effect;Impaired balance/gait Medication side effect -  Follow up Falls evaluation completed;Falls prevention discussed Falls evaluation completed;Falls prevention discussed -    DM - last visit we started basaglar 10u daily in addition to her glipizide, metformin and farxiga. This was changed to lantus due to insurance preference. Did not try due to need for daily injections. Through [facility] we trialed ozempic 0.98m weekly - she did not try this. Metformin also changed to XR formulation. Cymbalta 253mdaily started for mood. Lost her glucometers - not checking her sugars. Endorses occasional paresthesias to feet. Notes increasing unsteadiness.   5d h/o vaginal soreness at end of stream along with frequency and dysuria. No fevers/chills, hematuria, abd pain, nausea/vomiting. No vaginal discharge, rashes, or yeast infection symptoms.   Preventative: ESOPHAGOGASTRODUODENOSCOPY 03/2019 - gastric atrophy, benign biopsy ([place]) COLONOSCOPY 03/2019 - TAx1, diverticulosis ([place])  Breast cancer  screening -overdue. Advised to call to schedule mammo. She does breast exams at home without concerns.  Well woman exam -desires to age out. Denies pelvic pain or vaginal bleeding DEXA T -1.8 hip (01/2017) osteopenia  Lung cancer screening -not eligible Flu shotyearly  COVID vaccine Moderna 03/2020 x1 - felt ill so didn't complete  Td 2005  Prevnar 2018, presumed pneumovax 2012 Shingrix - discussed - to check at pharmacy - she had bad shingles illness 1990s (hospitalized) Advanced directive discussion -does not have set up. Would want TeBarth Kirksr brother DoMarden Nobleo be HCPOA. Packet provided previously. Seat belt use discussed Sunscreen use discussed, no changing moles on skin. 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 Interim medical history since our last visit reviewed. Allergies and medications reviewed and updated. Outpatient Medications Prior to Visit  Medication Sig Dispense Refill  . acetaminophen (TYLENOL) 500 MG tablet Take 500 mg by mouth as needed.    . Marland Kitchenlbuterol (PROAIR HFA) 108 (90 Base) MCG/ACT inhaler Inhale 2 puffs into the  lungs every 6 (six) hours as needed for wheezing or shortness of breath. 18 g 0  . Blood Glucose Monitoring Suppl (ONE TOUCH ULTRA 2) w/Device KIT Use as instructed to check blood sugar 2 times daily as needed. 1 kit 0  . clopidogrel (PLAVIX) 75 MG tablet Take 1 tablet (75 mg total) by mouth daily. 90 tablet 3  . Cyanocobalamin (VITAMIN B12) 500 MCG TABS Take 2 tablets by mouth daily.    Marland Kitchen docusate sodium (COLACE)  100 MG capsule Take 1 capsule (100 mg total) by mouth daily. 30 capsule 6  . gabapentin (NEURONTIN) 100 MG capsule Take 100 mg by mouth 3 (three) times daily.    Marland Kitchen gabapentin (NEURONTIN) 300 MG capsule Take 1 capsule (300 mg total) by mouth 2 (two) times daily. 60 capsule 11  . glucose blood (ONETOUCH ULTRA) test strip Use as instructed to check blood sugar 2 times daily as needed 100 strip 3  . isosorbide mononitrate (IMDUR) 30 MG 24 hr tablet Take 0.5 tablets (15 mg total) by mouth daily. 45 tablet 3  . latanoprost (XALATAN) 0.005 % ophthalmic solution Place 1 drop into both eyes at bedtime.     . metoCLOPramide (REGLAN) 10 MG tablet Take 1 tablet (10 mg total) by mouth every 6 (six) hours as needed for nausea or vomiting. 120 tablet 0  . metoprolol succinate (TOPROL XL) 25 MG 24 hr tablet Take 0.5 tablets (12.5 mg total) by mouth daily. 45 tablet 3  . ondansetron (ZOFRAN ODT) 4 MG disintegrating tablet Take 1 tablet (4 mg total) by mouth every 8 (eight) hours as needed for nausea or vomiting. 10 tablet 0  . OneTouch Delica Lancets 40J MISC Use as directed to check blood sugar 2 times daily as needed 100 each 3  . apixaban (ELIQUIS) 5 MG TABS tablet Take 1 tablet (5 mg total) by mouth 2 (two) times daily. 184 tablet 0  . atorvastatin (LIPITOR) 40 MG tablet TAKE 1 TABLET BY MOUTH EVERYDAY AT BEDTIME 90 tablet 0  . dapagliflozin propanediol (FARXIGA) 5 MG TABS tablet Take 1 tablet (5 mg total) by mouth daily before breakfast. 30 tablet 2  . DULoxetine (CYMBALTA) 20 MG capsule Take 1 capsule (20 mg total) by mouth daily. 90 capsule 1  . furosemide (LASIX) 20 MG tablet Take 1 tablet (20 mg total) by mouth daily. 90 tablet 0  . glipiZIDE (GLUCOTROL) 5 MG tablet TAKE 1 TABLET (5 MG TOTAL) BY MOUTH 2 (TWO) TIMES DAILY BEFORE A MEAL. 180 tablet 0  . insulin glargine (LANTUS SOLOSTAR) 100 UNIT/ML Solostar Pen Inject 10 Units into the skin daily. 15 mL 1  . metFORMIN (GLUCOPHAGE XR) 500 MG 24 hr tablet Take  2 tablets (1,000 mg total) by mouth daily with breakfast. 90 tablet 1  . pantoprazole (PROTONIX) 40 MG tablet TAKE ONE TABLET BY MOUTH TWICE DAILY 60 tablet 0  . pantoprazole (PROTONIX) 20 MG tablet Take 20 mg by mouth daily.     No facility-administered medications prior to visit.     Per HPI unless specifically indicated in ROS section below Review of Systems  Constitutional: Negative for activity change, appetite change, chills, fatigue, fever and unexpected weight change.  HENT: Negative for hearing loss.   Eyes: Negative for visual disturbance.  Respiratory: Negative for cough, chest tightness, shortness of breath and wheezing.   Cardiovascular: Negative for chest pain, palpitations and leg swelling.  Gastrointestinal: Negative for abdominal distention, abdominal pain, blood in stool, constipation, diarrhea, nausea and  vomiting.  Genitourinary: Negative for difficulty urinating and hematuria.  Musculoskeletal: Negative for arthralgias, myalgias and neck pain.  Skin: Negative for rash.  Neurological: Negative for dizziness, seizures, syncope and headaches.  Hematological: Negative for adenopathy. Bruises/bleeds easily.  Psychiatric/Behavioral: Negative for dysphoric mood. The patient is not nervous/anxious.    Objective:  BP 120/76   Pulse 91   Temp 97.6 F (36.4 C) (Temporal)   Ht 5' 2.5" (1.588 m)   Wt 141 lb 5 oz (64.1 kg)   SpO2 97%   BMI 25.43 kg/m   Wt Readings from Last 3 Encounters:  06/13/20 141 lb 5 oz (64.1 kg)  03/27/20 145 lb (65.8 kg)  01/28/20 144 lb (65.3 kg)      Physical Exam Vitals and nursing note reviewed.  Constitutional:      General: She is not in acute distress.    Appearance: Normal appearance. She is well-developed. She is not ill-appearing.  HENT:     Head: Normocephalic and atraumatic.     Right Ear: Hearing, tympanic membrane, ear canal and external ear normal.     Left Ear: Hearing, tympanic membrane, ear canal and external ear normal.   Eyes:     General: No scleral icterus.    Extraocular Movements: Extraocular movements intact.     Conjunctiva/sclera: Conjunctivae normal.     Pupils: Pupils are equal, round, and reactive to light.  Neck:     Thyroid: No thyroid mass or thyromegaly.     Vascular: No carotid bruit.  Cardiovascular:     Rate and Rhythm: Normal rate and regular rhythm.     Pulses: Normal pulses.          Radial pulses are 2+ on the right side and 2+ on the left side.     Heart sounds: Normal heart sounds. No murmur heard.   Pulmonary:     Effort: Pulmonary effort is normal. No respiratory distress.     Breath sounds: Normal breath sounds. No wheezing, rhonchi or rales.  Abdominal:     General: Bowel sounds are normal. There is no distension.     Palpations: Abdomen is soft. There is no mass.     Tenderness: There is no abdominal tenderness. There is no guarding or rebound.     Hernia: No hernia is present.  Musculoskeletal:        General: Normal range of motion.     Cervical back: Normal range of motion and neck supple.     Right lower leg: No edema.     Left lower leg: No edema.  Lymphadenopathy:     Cervical: No cervical adenopathy.  Skin:    General: Skin is warm and dry.     Findings: No rash.  Neurological:     General: No focal deficit present.     Mental Status: She is alert and oriented to person, place, and time.     Comments: CN grossly intact, station and gait intact  Psychiatric:        Mood and Affect: Mood normal.        Behavior: Behavior normal.        Thought Content: Thought content normal.        Judgment: Judgment normal.       Lab Results  Component Value Date   CHOL 109 05/02/2020   HDL 41.10 05/02/2020   LDLCALC 40 05/02/2020   LDLDIRECT 134.9 10/21/2011   TRIG 139.0 05/02/2020   CHOLHDL 3 05/02/2020  Lab Results  Component Value Date   CREATININE 1.38 (H) 05/02/2020   BUN 12 05/02/2020   NA 140 05/02/2020   K 3.7 05/02/2020   CL 100 05/02/2020    CO2 29 05/02/2020    Results for orders placed or performed in visit on 06/13/20  POCT Urinalysis Dipstick (Automated)  Result Value Ref Range   Color, UA yellow    Clarity, UA cloudy    Glucose, UA Positive (A) Negative   Bilirubin, UA negative    Ketones, UA negative    Spec Grav, UA 1.015 1.010 - 1.025   Blood, UA negative    pH, UA 5.5 5.0 - 8.0   Protein, UA Negative Negative   Urobilinogen, UA 0.2 0.2 or 1.0 E.U./dL   Nitrite, UA negative    Leukocytes, UA Negative Negative   Depression screen [facility] 2/9 06/06/2020 05/02/2020 03/15/2019 12/11/2018 12/08/2018  Decreased Interest 2 2 0 1 0  Down, Depressed, Hopeless 2 2 0 1 0  PHQ - 2 Score 4 4 0 2 0  Altered sleeping 0 0 - 1 0  Tired, decreased energy 0 2 - 1 0  Change in appetite 0 2 - 0 0  Feeling bad or failure about yourself  0 2 - 1 0  Trouble concentrating 0 1 - 1 0  Moving slowly or fidgety/restless 0 1 - 1 0  Suicidal thoughts 0 0 - 1 0  PHQ-9 Score 4 12 - 8 0  Difficult doing work/chores Not difficult at all Very difficult - - Not difficult at all  Some recent data might be hidden   No flowsheet data found.  Assessment & Plan:  This visit occurred during the SARS-CoV-2 public health emergency.  Safety protocols were in place, including screening questions prior to the visit, additional usage of staff PPE, and extensive cleaning of exam room while observing appropriate contact time as indicated for disinfecting solutions.   Problem List Items Addressed This Visit    MDD (major depressive disorder), recurrent severe, without psychosis ([place])    Chronic, stable to improved on low dose cymbalta - will increase to 23m daily.       Relevant Medications   DULoxetine (CYMBALTA) 30 MG capsule   Hypertension, essential    Chronic, stable. Continue lasix and isosorbide and toprol XL low dose.       Relevant Medications   apixaban (ELIQUIS) 5 MG TABS tablet   atorvastatin (LIPITOR) 40 MG tablet   furosemide (LASIX) 20 MG  tablet   Hyperlipidemia associated with type 2 diabetes mellitus ([name])    Chronic, adequate on statin - continue. The ASCVD Risk score (Mikey BussingDC Jr., et al., 2013) failed to calculate for the following reasons:   The patient has a prior MI or stroke diagnosis       Relevant Medications   insulin glargine (LANTUS SOLOSTAR) 100 UNIT/ML Solostar Pen   atorvastatin (LIPITOR) 40 MG tablet   dapagliflozin propanediol (FARXIGA) 5 MG TABS tablet   glipiZIDE (GLUCOTROL) 5 MG tablet   metFORMIN (GLUCOPHAGE XR) 500 MG 24 hr tablet   DM (diabetes mellitus), type 2, uncontrolled w/neurologic complication ([name])    Chronic, deteriorated - not taking insulin, did not try weekly GLP1RA. Reviewed importance of restart daily insulin routine - start 10u daily.       Relevant Medications   insulin glargine (LANTUS SOLOSTAR) 100 UNIT/ML Solostar Pen   atorvastatin (LIPITOR) 40 MG tablet   dapagliflozin propanediol (FARXIGA) 5 MG TABS tablet  glipiZIDE (GLUCOTROL) 5 MG tablet   metFORMIN (GLUCOPHAGE XR) 500 MG 24 hr tablet   History of cerebrovascular accident (CVA) in adulthood    Continue plavix, eliquis      Chronic diastolic CHF (congestive heart failure) ([name])    Continue daily lasix.       Relevant Medications   apixaban (ELIQUIS) 5 MG TABS tablet   atorvastatin (LIPITOR) 40 MG tablet   furosemide (LASIX) 20 MG tablet   Iron deficiency    Not on oral iron. Ferritin levels low normal. Will watch.       Vitamin B12 deficiency    Continue oral b12 replacement.       Stage 3 chronic kidney disease due to type 2 diabetes mellitus ([place])    Will continue to monitor this.       Relevant Medications   insulin glargine (LANTUS SOLOSTAR) 100 UNIT/ML Solostar Pen   atorvastatin (LIPITOR) 40 MG tablet   dapagliflozin propanediol (FARXIGA) 5 MG TABS tablet   glipiZIDE (GLUCOTROL) 5 MG tablet   metFORMIN (GLUCOPHAGE XR) 500 MG 24 hr tablet   Osteopenia    Consider updated DEXA next year        GERD (gastroesophageal reflux disease)    Continue pantoprazole daily.       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      Relevant Medications   insulin glargine (LANTUS SOLOSTAR) 100 UNIT/ML Solostar Pen   atorvastatin (LIPITOR) 40 MG tablet   dapagliflozin propanediol (FARXIGA) 5 MG TABS tablet   glipiZIDE (GLUCOTROL) 5 MG tablet   metFORMIN (GLUCOPHAGE XR) 500 MG 24 hr tablet   Health maintenance examination - Primary    Preventative protocols reviewed and updated unless pt declined. Discussed healthy diet and lifestyle.       Acute deep vein thrombosis (DVT) of left tibial vein ([name])    On plavix + eliquis since 07/2019 (LLE DVT and presumed PE)  Plan was 6 months of eliquis, plavix, then reassess.  She has continued this - will touch base next visit on duration.       Relevant Medications   apixaban (ELIQUIS) 5 MG TABS tablet   atorvastatin (LIPITOR) 40 MG tablet   furosemide (LASIX) 20 MG tablet   Dysuria    UA today normal.       Relevant Orders   POCT Urinalysis Dipstick (Automated) (Completed)   Low-tension glaucoma, bilateral, severe stage   RESOLVED: Hyperlipidemia   Relevant Medications   apixaban (ELIQUIS) 5 MG TABS tablet   atorvastatin (LIPITOR) 40 MG tablet   furosemide (LASIX) 20 MG tablet       Meds ordered this encounter  Medications  . insulin glargine (LANTUS SOLOSTAR) 100 UNIT/ML Solostar Pen    Sig: Inject 10 Units into the skin daily.    Dispense:  15 mL    Refill:  2  . DULoxetine (CYMBALTA) 30 MG capsule    Sig: Take 1 capsule (30 mg total) by mouth daily.    Dispense:  90 capsule    Refill:  3    Note new dose  . pantoprazole (PROTONIX) 40 MG tablet    Sig: Take 1 tablet (40 mg total) by mouth 2 (two) times daily.    Dispense:  60 tablet    Refill:  8    This  prescription was  filled on 05/18/2020. Any refills authorized will be placed on file.  Marland Kitchen apixaban (ELIQUIS) 5 MG TABS tablet    Sig: Take 1 tablet (5 mg total) by mouth 2 (two) times daily.    Dispense:  184 tablet    Refill:  1  . atorvastatin (LIPITOR) 40 MG tablet    Sig: TAKE 1 TABLET BY MOUTH EVERYDAY AT BEDTIME    Dispense:  90 tablet    Refill:  3  . dapagliflozin propanediol (FARXIGA) 5 MG TABS tablet    Sig: Take 1 tablet (5 mg total) by mouth daily before breakfast.    Dispense:  30 tablet    Refill:  11  . furosemide (LASIX) 20 MG tablet    Sig: Take 1 tablet (20 mg total) by mouth daily.    Dispense:  90 tablet    Refill:  1  . glipiZIDE (GLUCOTROL) 5 MG tablet    Sig: TAKE 1 TABLET (5 MG TOTAL) BY MOUTH 2 (TWO) TIMES DAILY BEFORE A MEAL.    Dispense:  180 tablet    Refill:  1  . metFORMIN (GLUCOPHAGE XR) 500 MG 24 hr tablet    Sig: Take 2 tablets (1,000 mg total) by mouth daily with breakfast.    Dispense:  180 tablet    Refill:  1   Orders Placed This Encounter  Procedures  . POCT Urinalysis Dipstick (Automated)    Patient instructions: If interested, check with pharmacy about new 2 shot shingles series (shingrix). Urine test returned ok, just showing high sugar levels. Work on sugar control - please restart insulin injections 10 units daily.  Refill eye drops - call Dr Katy Fitch if you need refills.  Return in 3 months for diabetes follow up visit.   Follow up plan: Return in about 3 months (around 09/12/2020) for follow up visit.  [name], MD

## 2020-06-13 NOTE — Patient Instructions (Addendum)
If interested, check with pharmacy about new 2 shot shingles series (shingrix). Urine test returned ok, just showing high sugar levels. Work on sugar control - please restart insulin injections 10 units daily.  Refill eye drops - call Dr Katy Fitch if you need refills.  Return in 3 months for diabetes follow up visit.   Health Maintenance After Age 77 After age 36, you are at a higher risk for certain long-term diseases and infections as well as injuries from falls. Falls are a major cause of broken bones and head injuries in people who are older than age 3. Getting regular preventive care can help to keep you healthy and well. Preventive care includes getting regular testing and making lifestyle changes as recommended by your health care provider. Talk with your health care provider about:  Which screenings and tests you should have. A screening is a test that checks for a disease when you have no symptoms.  A diet and exercise plan that is right for you. What should I know about screenings and tests to prevent falls? Screening and testing are the best ways to find a health problem early. Early diagnosis and treatment give you the best chance of managing medical conditions that are common after age 42. Certain conditions and lifestyle choices may make you more likely to have a fall. Your health care provider may recommend:  Regular vision checks. Poor vision and conditions such as cataracts can make you more likely to have a fall. If you wear glasses, make sure to get your prescription updated if your vision changes.  Medicine review. Work with your health care provider to regularly review all of the medicines you are taking, including over-the-counter medicines. Ask your health care provider about any side effects that may make you more likely to have a fall. Tell your health care provider if any medicines that you take make you feel dizzy or sleepy.  Osteoporosis screening. Osteoporosis is a  condition that causes the bones to get weaker. This can make the bones weak and cause them to break more easily.  Blood pressure screening. Blood pressure changes and medicines to control blood pressure can make you feel dizzy.  Strength and balance checks. Your health care provider may recommend certain tests to check your strength and balance while standing, walking, or changing positions.  Foot health exam. Foot pain and numbness, as well as not wearing proper footwear, can make you more likely to have a fall.  Depression screening. You may be more likely to have a fall if you have a fear of falling, feel emotionally low, or feel unable to do activities that you used to do.  Alcohol use screening. Using too much alcohol can affect your balance and may make you more likely to have a fall. What actions can I take to lower my risk of falls? General instructions  Talk with your health care provider about your risks for falling. Tell your health care provider if: ? You fall. Be sure to tell your health care provider about all falls, even ones that seem minor. ? You feel dizzy, sleepy, or off-balance.  Take over-the-counter and prescription medicines only as told by your health care provider. These include any supplements.  Eat a healthy diet and maintain a healthy weight. A healthy diet includes low-fat dairy products, low-fat (lean) meats, and fiber from whole grains, beans, and lots of fruits and vegetables. Home safety  Remove any tripping hazards, such as rugs, cords, and clutter.  Install  safety equipment such as grab bars in bathrooms and safety rails on stairs.  Keep rooms and walkways well-lit. Activity  Follow a regular exercise program to stay fit. This will help you maintain your balance. Ask your health care provider what types of exercise are appropriate for you.  If you need a cane or walker, use it as recommended by your health care provider.  Wear supportive shoes that  have nonskid soles.   Lifestyle  Do not drink alcohol if your health care provider tells you not to drink.  If you drink alcohol, limit how much you have: ? 0-1 drink a day for women. ? 0-2 drinks a day for men.  Be aware of how much alcohol is in your drink. In the [place] one drink equals one typical bottle of beer (12 oz), one-half glass of wine (5 oz), or one shot of hard liquor (1 oz).  Do not use any products that contain nicotine or tobacco, such as cigarettes and e-cigarettes. If you need help quitting, ask your health care provider. Summary  Having a healthy lifestyle and getting preventive care can help to protect your health and wellness after age 19.  Screening and testing are the best way to find a health problem early and help you avoid having a fall. Early diagnosis and treatment give you the best chance for managing medical conditions that are more common for people who are older than age 62.  Falls are a major cause of broken bones and head injuries in people who are older than age 29. Take precautions to prevent a fall at home.  Work with your health care provider to learn what changes you can make to improve your health and wellness and to prevent falls. This information is not intended to replace advice given to you by your health care provider. Make sure you discuss any questions you have with your health care provider. Document Revised: 06/18/2018 Document Reviewed: 01/08/2017 Elsevier Patient Education  2021 [employer].

## 2020-06-13 NOTE — Assessment & Plan Note (Signed)
Chronic, stable to improved on low dose cymbalta - will increase to 30mg  daily.

## 2020-06-13 NOTE — Assessment & Plan Note (Signed)
Not on oral iron. Ferritin levels low normal. Will watch.

## 2020-06-13 NOTE — Assessment & Plan Note (Signed)
Continue pantoprazole daily.  

## 2020-06-13 NOTE — Assessment & Plan Note (Signed)
Chronic, adequate on statin - continue. The ASCVD Risk score Mikey Bussing DC Jr., et al., 2013) failed to calculate for the following reasons:   The patient has a prior MI or stroke diagnosis

## 2020-06-13 NOTE — Assessment & Plan Note (Signed)
Continue daily lasix.

## 2020-06-23 ENCOUNTER — Telehealth: Payer: Self-pay

## 2020-06-23 ENCOUNTER — Telehealth: Payer: Medicare Other

## 2020-06-23 NOTE — Progress Notes (Incomplete)
Chronic Care Management Pharmacy Note  06/23/2020 Name:  Maria Fox MRN:  [id number] DOB:  [birth year]  Subjective: Maria Fox is an 75 y.o. year old female who is a primary patient of [name], MD.  The CCM team was consulted for assistance with disease management and care coordination needs.    Engaged with patient by telephone for follow up visit in response to provider referral for pharmacy case management and/or care coordination services.   Consent to Services:  The patient was given information about Chronic Care Management services, agreed to services, and gave verbal consent prior to initiation of services.  Please see initial visit note for detailed documentation.   Patient Care Team: [name], MD as PCP - General (Family Medicine) Sueanne Margarita, MD as PCP - Cardiology (Cardiology) [name], [facility] as Pharmacist (Pharmacist)  Recent office visits:  06/13/20 - PCP - start Basaglar 10 units daily, in addition to glipizide, metformin, Farxiga. Increase Cymbalta to 30 mg daily.   11/08/19 - PCP - DM - does not regularly check sugars - lost her meter. Compliant with antihyperglycemic regimen which includes: farxiga 57m daily, glipizide 517mbid, metformin 50080mID. Tolerating this regimen well. Wants to avoid injections at all costs. Denies low sugars or hypoglycemic symptoms.  Foot exam completed. Start insulin, RTC 1 month for glucose log.  Recent consult visits:   01/28/20 - Cardiology - ASCAD, continue ASA 28m11mily, Plavix 75mg73mly, statin and BB. HTN, controlled, continue Lopressor 12.5mg d64my>>it says on her med rec that she is on short acting lopressor which can exacerbate COPD>>will check with pharmacy. A-flutter, continue Eliquis BID. CHF, Conte Lasix 20 mg daily. HLD, cont atorvastatin 20 mg daily.   Hospital visits: 03/27/20 - COVID-19  Objective:  Lab Results  Component Value Date   CREATININE 1.38 (H) 05/02/2020   BUN 12 05/02/2020    GFR 37.02 (L) 05/02/2020   GFRNONAA 37 (L) 03/27/2020   GFRAA 41 (L) 01/28/2020   NA 140 05/02/2020   K 3.7 05/02/2020   CALCIUM 9.7 05/02/2020   CO2 29 05/02/2020    Lab Results  Component Value Date/Time   HGBA1C 12.2 (H) 05/02/2020 03:25 PM   HGBA1C 11.2 (A) 11/08/2019 04:04 PM   HGBA1C 10.8 (H) 08/03/2019 01:03 PM   FRUCTOSAMINE 302 (H) 11/10/2015 04:27 PM   GFR 37.02 (L) 05/02/2020 03:25 PM   GFR 41.63 (L) 08/03/2019 01:03 PM   MICROALBUR <0.7 05/02/2020 03:25 PM   MICROALBUR <0.7 11/08/2019 05:08 PM    Last diabetic Eye exam:  Lab Results  Component Value Date/Time   HMDIABEYEEXA Retinopathy (A) 03/23/2020 12:00 AM    Last diabetic Foot exam: 10/2019  Lab Results  Component Value Date   CHOL 109 05/02/2020   HDL 41.10 05/02/2020   LDLCALC 40 05/02/2020   LDLDIRECT 134.9 10/21/2011   TRIG 139.0 05/02/2020   CHOLHDL 3 05/02/2020    Hepatic Function Latest Ref Rng & Units 05/02/2020 03/27/2020 08/03/2019  Total Protein 6.0 - 8.3 g/dL 7.4 6.9 -  Albumin 3.5 - 5.2 g/dL 4.1 3.4(L) 4.2  AST 0 - 37 U/L 15 48(H) -  ALT 0 - 35 U/L 10 28 -  Alk Phosphatase 39 - 117 U/L 101 81 -  Total Bilirubin 0.2 - 1.2 mg/dL 0.8 0.8 -  Bilirubin, Direct 0.00 - 0.40 mg/dL - - -    Lab Results  Component Value Date/Time   TSH 1.247 07/10/2019 10:27 AM   TSH 2.64 05/02/2017  04:13 PM   TSH 1.355 07/26/2015 04:21 AM   TSH 3.457 07/08/2014 05:00 AM   FREET4 0.87 07/13/2014 11:18 AM    CBC Latest Ref Rng & Units 03/27/2020 01/28/2020 08/03/2019  WBC 4.0 - 10.5 K/uL 4.7 7.6 6.6  Hemoglobin 12.0 - 15.0 g/dL 14.7 13.5 13.5  Hematocrit 36.0 - 46.0 % 43.9 40.0 40.2  Platelets 150 - 400 K/uL 188 226 228.0    Lab Results  Component Value Date/Time   VD25OH 32.36 05/02/2020 03:25 PM    Clinical ASCVD: Yes  The ASCVD Risk score Mikey Bussing DC Jr., et al., 2013) failed to calculate for the following reasons:   The patient has a prior MI or stroke diagnosis    Depression screen [facility] 2/9  06/06/2020 05/02/2020 03/15/2019  Decreased Interest 2 2 0  Down, Depressed, Hopeless 2 2 0  PHQ - 2 Score 4 4 0  Altered sleeping 0 0 -  Tired, decreased energy 0 2 -  Change in appetite 0 2 -  Feeling bad or failure about yourself  0 2 -  Trouble concentrating 0 1 -  Moving slowly or fidgety/restless 0 1 -  Suicidal thoughts 0 0 -  PHQ-9 Score 4 12 -  Difficult doing work/chores Not difficult at all Very difficult -  Some recent data might be hidden     Social History   Tobacco Use  Smoking Status Never Smoker  Smokeless Tobacco Never Used   BP Readings from Last 3 Encounters:  06/13/20 120/76  03/29/20 (!) 141/85  03/28/20 129/69   Pulse Readings from Last 3 Encounters:  06/13/20 91  03/29/20 97  03/28/20 86   Wt Readings from Last 3 Encounters:  06/13/20 141 lb 5 oz (64.1 kg)  03/27/20 145 lb (65.8 kg)  01/28/20 144 lb (65.3 kg)    Assessment/Interventions: Review of patient past medical history, allergies, medications, health status, including review of consultants reports, laboratory and other test data, was performed as part of comprehensive evaluation and provision of chronic care management services.   SDOH:  (Social Determinants of Health) assessments and interventions performed: Yes   CCM Care Plan  Allergies  Allergen Reactions  . Bee Venom     Throat swelling  . Mushroom Extract Complex Anaphylaxis  . Penicillins Anaphylaxis, Hives and Swelling    Has patient had a PCN reaction causing immediate rash, facial/tongue/throat swelling, SOB or lightheadedness with hypotension: Yes Has patient had a PCN reaction causing severe rash involving mucus membranes or skin necrosis: Yes Has patient had a PCN reaction that required hospitalization Yes Has patient had a PCN reaction occurring within the last 10 years: No If all of the above answers are "NO", then may proceed with Cephalosporin use.   . Codeine Nausea And Vomiting  . Sulfa Antibiotics Nausea And  Vomiting  . Iohexol   . Erythromycin Base Rash    Medications Reviewed Today    Reviewed by [name], MD (Physician) on 06/13/20 at 1544  Med List Status: <None>  Medication Order Taking? Sig Documenting Provider Last Dose Status Informant  acetaminophen (TYLENOL) 500 MG tablet [id number] Yes Take 500 mg by mouth as needed. [provider] Taking Active Self  albuterol (PROAIR HFA) 108 (90 Base) MCG/ACT inhaler [id number] Yes Inhale 2 puffs into the lungs every 6 (six) hours as needed for wheezing or shortness of breath. [name], MD Taking Active   apixaban [facility]) 5 MG TABS tablet [id number] Yes Take 1 tablet (5 mg total) by  mouth 2 (two) times daily. [name], MD Taking Active   atorvastatin (LIPITOR) 40 MG tablet [id number] Yes TAKE 1 TABLET BY MOUTH EVERYDAY AT BEDTIME [name], MD Taking Active   Blood Glucose Monitoring Suppl (ONE TOUCH ULTRA 2) w/Device KIT [id number] Yes Use as instructed to check blood sugar 2 times daily as needed. [name], MD Taking Active   clopidogrel (PLAVIX) 75 MG tablet [id number] Yes Take 1 tablet (75 mg total) by mouth daily. Sueanne Margarita, MD Taking Active   Cyanocobalamin (VITAMIN B12) 500 MCG TABS [id number] Yes Take 2 tablets by mouth daily. [provider] Taking Active   dapagliflozin propanediol (FARXIGA) 5 MG TABS tablet [id number] Yes Take 1 tablet (5 mg total) by mouth daily before breakfast. [name], MD Taking Active   docusate sodium (COLACE) 100 MG capsule [id number] Yes Take 1 capsule (100 mg total) by mouth daily. [name], MD Taking Active Self  DULoxetine (CYMBALTA) 20 MG capsule [id number] Yes Take 1 capsule (20 mg total) by mouth daily. [name], MD Taking Active   furosemide (LASIX) 20 MG tablet [id number] Yes Take 1 tablet (20 mg total) by mouth daily. [name], MD Taking Active   gabapentin (NEURONTIN) 100 MG capsule [id number] Yes Take 100 mg by  mouth 3 (three) times daily. [provider] Taking Active   gabapentin (NEURONTIN) 300 MG capsule [id number] Yes Take 1 capsule (300 mg total) by mouth 2 (two) times daily. [name], MD Taking Active   glipiZIDE (GLUCOTROL) 5 MG tablet [id number] Yes TAKE 1 TABLET (5 MG TOTAL) BY MOUTH 2 (TWO) TIMES DAILY BEFORE A MEAL. [name], MD Taking Active   glucose blood [facility] ULTRA) test strip [id number] Yes Use as instructed to check blood sugar 2 times daily as needed [name], MD Taking Active   insulin glargine (LANTUS SOLOSTAR) 100 UNIT/ML Solostar Pen [id number] Yes Inject 10 Units into the skin daily. [name], MD Taking Active   isosorbide mononitrate (IMDUR) 30 MG 24 hr tablet [id number] Yes Take 0.5 tablets (15 mg total) by mouth daily. Sueanne Margarita, MD Taking Active   latanoprost (XALATAN) 0.005 % ophthalmic solution [id number] Yes Place 1 drop into both eyes at bedtime.  [provider] Taking Active Self  metFORMIN (GLUCOPHAGE XR) 500 MG 24 hr tablet [id number] Yes Take 2 tablets (1,000 mg total) by mouth daily with breakfast. [name], MD Taking Active   metoCLOPramide (REGLAN) 10 MG tablet [id number] Yes Take 1 tablet (10 mg total) by mouth every 6 (six) hours as needed for nausea or vomiting. [name], MD Taking Active   metoprolol succinate (TOPROL XL) 25 MG 24 hr tablet [id number] Yes Take 0.5 tablets (12.5 mg total) by mouth daily. Sueanne Margarita, MD Taking Active   ondansetron (ZOFRAN ODT) 4 MG disintegrating tablet [id number] Yes Take 1 tablet (4 mg total) by mouth every 8 (eight) hours as needed for nausea or vomiting. [name], MD Taking Active   [facility] Lancets 01B MISC [id number] Yes Use as directed to check blood sugar 2 times daily as needed [name], MD Taking Active   pantoprazole ([place]) 40 MG tablet [id number] Yes TAKE ONE TABLET BY MOUTH TWICE DAILY [name], MD Taking Active            Patient Active Problem List   Diagnosis Date Noted  . Low-tension glaucoma, bilateral, severe stage 03/24/2020  . Dysuria 11/08/2019  . Right sided temporal headache 08/06/2019  . Atrial flutter with rapid  ventricular response ([place])   . Acute deep vein thrombosis (DVT) of left tibial vein ([place]) 07/11/2019  . Acute cholecystitis 07/07/2019  . Pain of right breast 03/15/2019  . Health maintenance examination 12/11/2018  . Type 2 diabetes mellitus with severe nonproliferative diabetic retinopathy with macular edema, bilateral ([place]) 07/16/2018  . Coronary artery disease of native artery of native heart with stable angina pectoris ([place])   . Effort angina ([place]) 11/12/2017  . Abnormal stress ECG   . Dyspnea on exertion 11/04/2017  . GERD (gastroesophageal reflux disease) 08/15/2017  . Trigger finger 06/09/2017  . Nocturnal leg movements 03/25/2017  . Osteopenia 02/01/2017  . Advanced care planning/counseling discussion 01/02/2017  . Stage 3 chronic kidney disease due to type 2 diabetes mellitus ([place]) 08/26/2016  . Insomnia 08/26/2016  . Vitamin B12 deficiency 11/10/2015  . General weakness 08/04/2015  . Epistaxis 07/07/2015  . Fatty liver disease, nonalcoholic 19/50/9326  . Chronic diastolic CHF (congestive heart failure) ([place]) 08/27/2014  . Positive hepatitis C antibody test 08/27/2014  . Iron deficiency 08/27/2014  . NSVT (nonsustained ventricular tachycardia) ([place])   . Nausea without vomiting   . Autonomic orthostatic hypotension 07/11/2014  . S/P CABG x 3 07/08/2014  . History of cerebrovascular accident (CVA) in adulthood 07/08/2014  . DM (diabetes mellitus), type 2, uncontrolled w/neurologic complication ([place]) 71/24/5809  . Itching 03/16/2012  . Rash 03/16/2012  . Medicare annual wellness visit, initial 10/28/2011  . Hyperlipidemia associated with type 2 diabetes mellitus ([place])   . Idiopathic angioedema 06/23/2011  . Vitiligo 06/23/2011  . Dermatitis 06/23/2011   . MDD (major depressive disorder), recurrent severe, without psychosis ([place]) 05/08/2006  . Hypertension, essential 05/08/2006  . MENOPAUSAL SYNDROME 05/08/2006    Immunization History  Administered Date(s) Administered  . Fluad Quad(high Dose 65+) 12/11/2018  . Influenza Split 12/17/2011  . Influenza Whole 01/22/2013  . Influenza,inj,Quad PF,6+ Mos 11/10/2015, 12/26/2016, 11/26/2017  . Moderna Sars-Covid-2 Vaccination 03/21/2020  . Pneumococcal Conjugate-13 12/26/2016  . Pneumococcal-Unspecified 11/30/2010  . Td 08/10/2003    Conditions to be addressed/monitored:  Diabetes and Depression  There are no care plans that you recently modified to display for this patient.   Patient Care Plan: CCM Pharmacy Care Plan    Problem Identified: CHL AMB "PATIENT-SPECIFIC PROBLEM"     Long-Range Goal: Disease Management   Start Date: 05/03/2020  Priority: High  Note:    Current Barriers:  . Unable to independently monitor therapeutic efficacy . Unable to achieve control of diabetes  . Unable to self administer medications as prescribed  Pharmacist Clinical Goal(s):  Marland Kitchen Over the next 30 days, patient will achieve adherence to monitoring guidelines and medication adherence to achieve therapeutic efficacy through collaboration with PharmD and provider.   Interventions: . 1:1 collaboration with [name], MD regarding development and update of comprehensive plan of care as evidenced by provider attestation and co-signature . Inter-disciplinary care team collaboration (see longitudinal plan of care) . Comprehensive medication review performed; medication list updated in electronic medical record  Diabetes (A1c goal <7%) -Uncontrolled -Current medications:  Ozempic 0.25 mg - 1 injection weekly for 4 weeks, then increase to 0.5 mg weekly  Farxiga 5 mg - 1 tablet daily  Metformin 500 mg - 1 tablet TID  Glipizide 5 mg - 1 tablet BID  -Medications previously tried: Lantus -  never started -Current home glucose readings - misplaced meter . fasting glucose: none . post prandial glucose: none -Denies hypoglycemic/hyperglycemic symptoms -Current meal patterns: eats out most meals -  provided a fast food dietary handout . snacks: potato chips . drinks: pineapple, apple, orange, and tomato juice, coke, 7-up -Educated onsugar content in fruit juices and soda - try flavored water instead -Counseled to check feet daily and get yearly eye exams -Ms. Mertha has not started her Ozempic. Demonstrated how to use it today in office. She declined insulin due to daily injections. She is willing to give the Ozempic a try. Discussed potential of stopping glipizide if Ozempic is effective. We also discussed pill burden. She would like to try metformin ER 500 mg so she doesn't have to take it TID (recommend dose reduction to 1000 mg daily due to GFR < 45).   -Recommended starting Ozempic this week. Recommend change metformin to ER 500 mg - 2 tablets in the morning with breakfast due to CKD.   Depression/Anxiety (Goal: Improve symptoms) -Uncontrolled  -History of auditory hallucinations  -Current treatment: . No pharmacotherapy . She has trazodone PRN for sleep but has not taken it  -Medications previously tried/failed: sertraline 2015 (75 mg) --> changed to duloxetine 30, up to 60 mg (2017-2018) citalopram 2018 - unknown efficacy of all, there is note in chart that duloxetine was not effective and thus changed to citalopram. But then later placed back on Cymbalta after a psych admission along with trazodone and Geodon. -PHQ9:  12 (Today) -Declines interest in CBT. Reports this was not effective in the past. She is interested in medication. -Educated on Benefits of medication for symptom control Benefits of cognitive-behavioral therapy with or without medication -Collaborated with Dr. [name] for antidepressant therapy - consider restarting Cymbalta  Patient Goals/Self-Care  Activities . Over the next 30 days, patient will:  - take medications as prescribed  Follow Up Plan: Next PCP appointment scheduled for:  June 13, 2020; CMA follow up call in 2 weeks to review Ozempic adherence/tolerance     Medication Assistance: None required.  Patient affirms current coverage meets needs.  Patient's preferred pharmacy is: [facility] - [place], [place] - [address] [place] [address] Phone: [phone] Fax: [phone]  Uses pill box? Yes Pt endorses 100% compliance - except insulin, Ozempic (not taking)  Care Plan and Follow Up Patient Decision:  Patient agrees to Care Plan and Follow-up.  [name], PharmD Clinical Pharmacist [facility] [phone]

## 2020-06-30 NOTE — Telephone Encounter (Signed)
Chronic Care Management Pharmacy Note  06/30/2020 Name:  Maria Fox MRN:  [id number] DOB:  Nov 16, 1945  Subjective: Maria Fox is an 75 y.o. year old female who is a primary patient of [name], MD.  The CCM team was consulted for assistance with disease management and care coordination needs.    Attempted to reach patient by telephone for follow up visit in response to provider referral for pharmacy case management and/or care coordination services. Left VM with contact information. Chart reviewed prior to visit.  Consent to Services:  The patient was given information about Chronic Care Management services, agreed to services, and gave verbal consent prior to initiation of services.  Please see initial visit note for detailed documentation.   Patient Care Team: [name], MD as PCP - General (Family Medicine) Sueanne Margarita, MD as PCP - Cardiology (Cardiology) [name], [facility] as Pharmacist (Pharmacist)  Recent office visits:  06/13/20 - PCP - start Basaglar 10 units daily, in addition to glipizide, metformin, Farxiga. Increase Cymbalta to 30 mg daily.   11/08/19 - PCP - DM - does not regularly check sugars - lost her meter. Compliant with antihyperglycemic regimen which includes: farxiga 37m daily, glipizide 541mbid, metformin 50060mID. Tolerating this regimen well. Wants to avoid injections at all costs. Denies low sugars or hypoglycemic symptoms.  Foot exam completed. Start insulin, RTC 1 month for glucose log.  Recent consult visits:   01/28/20 - Cardiology - ASCAD, continue ASA 5m65mily, Plavix 75mg71mly, statin and BB. HTN, controlled, continue Lopressor 12.5mg d68my>>it says on her med rec that she is on short acting lopressor which can exacerbate COPD>>will check with pharmacy. A-flutter, continue Eliquis BID. CHF, Conte Lasix 20 mg daily. HLD, cont atorvastatin 20 mg daily.   Hospital visits: 03/27/20 - COVID-19  Objective:  Lab Results   Component Value Date   CREATININE 1.38 (H) 05/02/2020   BUN 12 05/02/2020   GFR 37.02 (L) 05/02/2020   GFRNONAA 37 (L) 03/27/2020   GFRAA 41 (L) 01/28/2020   NA 140 05/02/2020   K 3.7 05/02/2020   CALCIUM 9.7 05/02/2020   CO2 29 05/02/2020    Lab Results  Component Value Date/Time   HGBA1C 12.2 (H) 05/02/2020 03:25 PM   HGBA1C 11.2 (A) 11/08/2019 04:04 PM   HGBA1C 10.8 (H) 08/03/2019 01:03 PM   FRUCTOSAMINE 302 (H) 11/10/2015 04:27 PM   GFR 37.02 (L) 05/02/2020 03:25 PM   GFR 41.63 (L) 08/03/2019 01:03 PM   MICROALBUR <0.7 05/02/2020 03:25 PM   MICROALBUR <0.7 11/08/2019 05:08 PM    Last diabetic Eye exam:  Lab Results  Component Value Date/Time   HMDIABEYEEXA Retinopathy (A) 03/23/2020 12:00 AM    Last diabetic Foot exam: 10/2019  Lab Results  Component Value Date   CHOL 109 05/02/2020   HDL 41.10 05/02/2020   LDLCALC 40 05/02/2020   LDLDIRECT 134.9 10/21/2011   TRIG 139.0 05/02/2020   CHOLHDL 3 05/02/2020    Hepatic Function Latest Ref Rng & Units 05/02/2020 03/27/2020 08/03/2019  Total Protein 6.0 - 8.3 g/dL 7.4 6.9 -  Albumin 3.5 - 5.2 g/dL 4.1 3.4(L) 4.2  AST 0 - 37 U/L 15 48(H) -  ALT 0 - 35 U/L 10 28 -  Alk Phosphatase 39 - 117 U/L 101 81 -  Total Bilirubin 0.2 - 1.2 mg/dL 0.8 0.8 -  Bilirubin, Direct 0.00 - 0.40 mg/dL - - -    Lab Results  Component Value Date/Time  TSH 1.247 07/10/2019 10:27 AM   TSH 2.64 05/02/2017 04:13 PM   TSH 1.355 07/26/2015 04:21 AM   TSH 3.457 07/08/2014 05:00 AM   FREET4 0.87 07/13/2014 11:18 AM    CBC Latest Ref Rng & Units 03/27/2020 01/28/2020 08/03/2019  WBC 4.0 - 10.5 K/uL 4.7 7.6 6.6  Hemoglobin 12.0 - 15.0 g/dL 14.7 13.5 13.5  Hematocrit 36.0 - 46.0 % 43.9 40.0 40.2  Platelets 150 - 400 K/uL 188 226 228.0    Lab Results  Component Value Date/Time   VD25OH 32.36 05/02/2020 03:25 PM    Clinical ASCVD: Yes  The ASCVD Risk score Mikey Bussing DC Jr., et al., 2013) failed to calculate for the following reasons:   The  patient has a prior MI or stroke diagnosis    Depression screen [facility] 2/9 06/06/2020 05/02/2020 03/15/2019  Decreased Interest 2 2 0  Down, Depressed, Hopeless 2 2 0  PHQ - 2 Score 4 4 0  Altered sleeping 0 0 -  Tired, decreased energy 0 2 -  Change in appetite 0 2 -  Feeling bad or failure about yourself  0 2 -  Trouble concentrating 0 1 -  Moving slowly or fidgety/restless 0 1 -  Suicidal thoughts 0 0 -  PHQ-9 Score 4 12 -  Difficult doing work/chores Not difficult at all Very difficult -  Some recent data might be hidden     Social History   Tobacco Use  Smoking Status Never Smoker  Smokeless Tobacco Never Used   BP Readings from Last 3 Encounters:  06/13/20 120/76  03/29/20 (!) 141/85  03/28/20 129/69   Pulse Readings from Last 3 Encounters:  06/13/20 91  03/29/20 97  03/28/20 86   Wt Readings from Last 3 Encounters:  06/13/20 141 lb 5 oz (64.1 kg)  03/27/20 145 lb (65.8 kg)  01/28/20 144 lb (65.3 kg)    Assessment/Interventions: Review of patient past medical history, allergies, medications, health status, including review of consultants reports, laboratory and other test data, was performed as part of comprehensive evaluation and provision of chronic care management services.   SDOH:  (Social Determinants of Health) assessments and interventions performed: No - unable to assess   CCM Care Plan  Allergies  Allergen Reactions  . Bee Venom     Throat swelling  . Mushroom Extract Complex Anaphylaxis  . Penicillins Anaphylaxis, Hives and Swelling    Has patient had a PCN reaction causing immediate rash, facial/tongue/throat swelling, SOB or lightheadedness with hypotension: Yes Has patient had a PCN reaction causing severe rash involving mucus membranes or skin necrosis: Yes Has patient had a PCN reaction that required hospitalization Yes Has patient had a PCN reaction occurring within the last 10 years: No If all of the above answers are "NO", then may proceed  with Cephalosporin use.   . Codeine Nausea And Vomiting  . Sulfa Antibiotics Nausea And Vomiting  . Iohexol   . Erythromycin Base Rash    Medications Reviewed Today    Reviewed by [name], MD (Physician) on 06/13/20 at 1544  Med List Status: <None>  Medication Order Taking? Sig Documenting Provider Last Dose Status Informant  acetaminophen (TYLENOL) 500 MG tablet [id number] Yes Take 500 mg by mouth as needed. [provider] Taking Active Self  albuterol (PROAIR HFA) 108 (90 Base) MCG/ACT inhaler [id number] Yes Inhale 2 puffs into the lungs every 6 (six) hours as needed for wheezing or shortness of breath. [name], MD Taking Active   apixaban (  ELIQUIS) 5 MG TABS tablet [id number] Yes Take 1 tablet (5 mg total) by mouth 2 (two) times daily. [name], MD Taking Active   atorvastatin (LIPITOR) 40 MG tablet [id number] Yes TAKE 1 TABLET BY MOUTH EVERYDAY AT BEDTIME [name], MD Taking Active   Blood Glucose Monitoring Suppl (ONE TOUCH ULTRA 2) w/Device KIT [id number] Yes Use as instructed to check blood sugar 2 times daily as needed. [name], MD Taking Active   clopidogrel (PLAVIX) 75 MG tablet [id number] Yes Take 1 tablet (75 mg total) by mouth daily. Sueanne Margarita, MD Taking Active   Cyanocobalamin (VITAMIN B12) 500 MCG TABS [id number] Yes Take 2 tablets by mouth daily. [provider] Taking Active   dapagliflozin propanediol (FARXIGA) 5 MG TABS tablet [id number] Yes Take 1 tablet (5 mg total) by mouth daily before breakfast. [name], MD Taking Active   docusate sodium (COLACE) 100 MG capsule [id number] Yes Take 1 capsule (100 mg total) by mouth daily. [name], MD Taking Active Self  DULoxetine (CYMBALTA) 20 MG capsule [id number] Yes Take 1 capsule (20 mg total) by mouth daily. [name], MD Taking Active   furosemide (LASIX) 20 MG tablet [id number] Yes Take 1 tablet (20 mg total) by mouth daily. [name], MD Taking Active   gabapentin (NEURONTIN) 100 MG capsule [id number] Yes Take 100 mg by mouth 3 (three) times daily. [provider] Taking Active   gabapentin (NEURONTIN) 300 MG capsule [id number] Yes Take 1 capsule (300 mg total) by mouth 2 (two) times daily. [name], MD Taking Active   glipiZIDE (GLUCOTROL) 5 MG tablet [id number] Yes TAKE 1 TABLET (5 MG TOTAL) BY MOUTH 2 (TWO) TIMES DAILY BEFORE A MEAL. [name], MD Taking Active   glucose blood [facility] ULTRA) test strip [id number] Yes Use as instructed to check blood sugar 2 times daily as needed [name], MD Taking Active   insulin glargine (LANTUS SOLOSTAR) 100 UNIT/ML Solostar Pen [id number] Yes Inject 10 Units into the skin daily. [name], MD Taking Active   isosorbide mononitrate (IMDUR) 30 MG 24 hr tablet [id number] Yes Take 0.5 tablets (15 mg total) by mouth daily. Sueanne Margarita, MD Taking Active   latanoprost (XALATAN) 0.005 % ophthalmic solution [id number] Yes Place 1 drop into both eyes at bedtime.  [provider] Taking Active Self  metFORMIN (GLUCOPHAGE XR) 500 MG 24 hr tablet [id number] Yes Take 2 tablets (1,000 mg total) by mouth daily with breakfast. [name], MD Taking Active   metoCLOPramide (REGLAN) 10 MG tablet [id number] Yes Take 1 tablet (10 mg total) by mouth every 6 (six) hours as needed for nausea or vomiting. [name], MD Taking Active   metoprolol succinate (TOPROL XL) 25 MG 24 hr tablet [id number] Yes Take 0.5 tablets (12.5 mg total) by mouth daily. Sueanne Margarita, MD Taking Active   ondansetron (ZOFRAN ODT) 4 MG disintegrating tablet [id number] Yes Take 1 tablet (4 mg total) by mouth every 8 (eight) hours as needed for nausea or vomiting. [name], MD Taking Active   [facility] Lancets 58I MISC [id number] Yes Use as directed to check blood sugar 2 times daily as needed [name], MD Taking Active   pantoprazole ([place]) 40  MG tablet [id number] Yes TAKE ONE TABLET BY MOUTH TWICE DAILY [name], MD Taking Active           Patient Active Problem List   Diagnosis Date Noted  . Low-tension glaucoma, bilateral, severe stage 03/24/2020  . Dysuria  11/08/2019  . Right sided temporal headache 08/06/2019  . Atrial flutter with rapid ventricular response ([place])   . Acute deep vein thrombosis (DVT) of left tibial vein ([place]) 07/11/2019  . Acute cholecystitis 07/07/2019  . Pain of right breast 03/15/2019  . Health maintenance examination 12/11/2018  . Type 2 diabetes mellitus with severe nonproliferative diabetic retinopathy with macular edema, bilateral ([place]) 07/16/2018  . Coronary artery disease of native artery of native heart with stable angina pectoris ([place])   . Effort angina ([place]) 11/12/2017  . Abnormal stress ECG   . Dyspnea on exertion 11/04/2017  . GERD (gastroesophageal reflux disease) 08/15/2017  . Trigger finger 06/09/2017  . Nocturnal leg movements 03/25/2017  . Osteopenia 02/01/2017  . Advanced care planning/counseling discussion 01/02/2017  . Stage 3 chronic kidney disease due to type 2 diabetes mellitus ([place]) 08/26/2016  . Insomnia 08/26/2016  . Vitamin B12 deficiency 11/10/2015  . General weakness 08/04/2015  . Epistaxis 07/07/2015  . Fatty liver disease, nonalcoholic 63/14/9702  . Chronic diastolic CHF (congestive heart failure) ([place]) 08/27/2014  . Positive hepatitis C antibody test 08/27/2014  . Iron deficiency 08/27/2014  . NSVT (nonsustained ventricular tachycardia) ([place])   . Nausea without vomiting   . Autonomic orthostatic hypotension 07/11/2014  . S/P CABG x 3 07/08/2014  . History of cerebrovascular accident (CVA) in adulthood 07/08/2014  . DM (diabetes mellitus), type 2, uncontrolled w/neurologic complication ([place]) 63/78/5885  . Itching 03/16/2012  . Rash 03/16/2012  . Medicare annual wellness visit, initial 10/28/2011  . Hyperlipidemia associated with type 2 diabetes  mellitus ([place])   . Idiopathic angioedema 06/23/2011  . Vitiligo 06/23/2011  . Dermatitis 06/23/2011  . MDD (major depressive disorder), recurrent severe, without psychosis ([place]) 05/08/2006  . Hypertension, essential 05/08/2006  . MENOPAUSAL SYNDROME 05/08/2006    Immunization History  Administered Date(s) Administered  . Fluad Quad(high Dose 65+) 12/11/2018  . Influenza Split 12/17/2011  . Influenza Whole 01/22/2013  . Influenza,inj,Quad PF,6+ Mos 11/10/2015, 12/26/2016, 11/26/2017  . Moderna Sars-Covid-2 Vaccination 03/21/2020  . Pneumococcal Conjugate-13 12/26/2016  . Pneumococcal-Unspecified 11/30/2010  . Td 08/10/2003    Conditions to be addressed/monitored:  Diabetes and Depression   Patient Care Plan: CCM Pharmacy Care Plan    Problem Identified: CHL AMB "PATIENT-SPECIFIC PROBLEM"     Long-Range Goal: Disease Management   Start Date: 05/03/2020  Priority: High  Note:    Current Barriers:  . Unable to independently monitor therapeutic efficacy . Unable to achieve control of diabetes  . Unable to self administer medications as prescribed  Pharmacist Clinical Goal(s):  Marland Kitchen Over the next 30 days, patient will achieve adherence to monitoring guidelines and medication adherence to achieve therapeutic efficacy through collaboration with PharmD and provider.   Interventions: . 1:1 collaboration with [name], MD regarding development and update of comprehensive plan of care as evidenced by provider attestation and co-signature . Inter-disciplinary care team collaboration (see longitudinal plan of care) . Comprehensive medication review performed; medication list updated in electronic medical record  Diabetes (A1c goal <7%) -Uncontrolled -Current medications:  Ozempic 0.25 mg - 1 injection weekly for 4 weeks, then increase to 0.5 mg weekly  Farxiga 5 mg - 1 tablet daily  Metformin 500 mg - 1 tablet TID  Glipizide 5 mg - 1 tablet BID  -Medications previously  tried: Lantus - never started -Current home glucose readings - misplaced meter . fasting glucose: none . post prandial glucose: none -Denies hypoglycemic/hyperglycemic symptoms -Current meal patterns: eats out most meals - provided  a fast food dietary handout . snacks: potato chips . drinks: pineapple, apple, orange, and tomato juice, coke, 7-up -Educated onsugar content in fruit juices and soda - try flavored water instead -Counseled to check feet daily and get yearly eye exams -Ms. Shalie has not started her Ozempic. Demonstrated how to use it today in office. She declined insulin due to daily injections. She is willing to give the Ozempic a try. Discussed potential of stopping glipizide if Ozempic is effective. We also discussed pill burden. She would like to try metformin ER 500 mg so she doesn't have to take it TID (recommend dose reduction to 1000 mg daily due to GFR < 45).   -Recommended starting Ozempic this week. Recommend change metformin to ER 500 mg - 2 tablets in the morning with breakfast due to CKD.   Depression/Anxiety (Goal: Improve symptoms) -Uncontrolled  -History of auditory hallucinations  -Current treatment: . No pharmacotherapy . She has trazodone PRN for sleep but has not taken it  -Medications previously tried/failed: sertraline 2015 (75 mg) --> changed to duloxetine 30, up to 60 mg (2017-2018) citalopram 2018 - unknown efficacy of all, there is note in chart that duloxetine was not effective and thus changed to citalopram. But then later placed back on Cymbalta after a psych admission along with trazodone and Geodon. -PHQ9:  12 (Today) -Declines interest in CBT. Reports this was not effective in the past. She is interested in medication. -Educated on Benefits of medication for symptom control Benefits of cognitive-behavioral therapy with or without medication -Collaborated with Dr. [name] for antidepressant therapy - consider restarting Cymbalta  Patient  Goals/Self-Care Activities . Over the next 30 days, patient will:  - take medications as prescribed  Follow Up Plan: Next PCP appointment scheduled for:  June 13, 2020; CMA follow up call in 2 weeks to review [place] above from previous visit - 05/02/20  Follow up: CMA review diabetes adherence/see if patient would like to schedule a nursing visit to assist with first insulin injection  Reschedule CCM visit for May 2022  Medication Assistance: None required.  Patient affirms current coverage meets needs.  Patient's preferred pharmacy is: [facility] - [place], [place] - [address] [place] [address] Phone: [phone] Fax: [phone]  Uses pill box? Yes - adherence packs  Care Plan and Follow Up Patient Decision:  Patient agrees to Care Plan and Follow-up.  [name], PharmD Clinical Pharmacist [facility] [phone]

## 2020-07-01 ENCOUNTER — Observation Stay (HOSPITAL_COMMUNITY)
Admission: EM | Admit: 2020-07-01 | Discharge: 2020-07-04 | Disposition: A | Discharge status: Home / Self Care | Payer: Medicare Other | Attending: Cardiology | Admitting: Cardiology

## 2020-07-01 ENCOUNTER — Emergency Department (HOSPITAL_COMMUNITY): Payer: Medicare Other

## 2020-07-01 ENCOUNTER — Other Ambulatory Visit: Payer: Self-pay

## 2020-07-01 ENCOUNTER — Encounter (HOSPITAL_COMMUNITY): Payer: Self-pay

## 2020-07-01 DIAGNOSIS — R6889 Other general symptoms and signs: Secondary | ICD-10-CM | POA: Diagnosis not present

## 2020-07-01 DIAGNOSIS — N179 Acute kidney failure, unspecified: Secondary | ICD-10-CM | POA: Diagnosis not present

## 2020-07-01 DIAGNOSIS — I25118 Atherosclerotic heart disease of native coronary artery with other forms of angina pectoris: Secondary | ICD-10-CM | POA: Diagnosis not present

## 2020-07-01 DIAGNOSIS — E1149 Type 2 diabetes mellitus with other diabetic neurological complication: Secondary | ICD-10-CM | POA: Diagnosis present

## 2020-07-01 DIAGNOSIS — I480 Paroxysmal atrial fibrillation: Secondary | ICD-10-CM | POA: Diagnosis not present

## 2020-07-01 DIAGNOSIS — R0789 Other chest pain: Principal | ICD-10-CM | POA: Insufficient documentation

## 2020-07-01 DIAGNOSIS — Z79899 Other long term (current) drug therapy: Secondary | ICD-10-CM | POA: Diagnosis not present

## 2020-07-01 DIAGNOSIS — E1140 Type 2 diabetes mellitus with diabetic neuropathy, unspecified: Secondary | ICD-10-CM | POA: Insufficient documentation

## 2020-07-01 DIAGNOSIS — Z7951 Long term (current) use of inhaled steroids: Secondary | ICD-10-CM | POA: Diagnosis not present

## 2020-07-01 DIAGNOSIS — Z7901 Long term (current) use of anticoagulants: Secondary | ICD-10-CM | POA: Insufficient documentation

## 2020-07-01 DIAGNOSIS — R072 Precordial pain: Secondary | ICD-10-CM

## 2020-07-01 DIAGNOSIS — I499 Cardiac arrhythmia, unspecified: Secondary | ICD-10-CM | POA: Diagnosis not present

## 2020-07-01 DIAGNOSIS — Z7984 Long term (current) use of oral hypoglycemic drugs: Secondary | ICD-10-CM | POA: Diagnosis not present

## 2020-07-01 DIAGNOSIS — I10 Essential (primary) hypertension: Secondary | ICD-10-CM | POA: Diagnosis present

## 2020-07-01 DIAGNOSIS — Z951 Presence of aortocoronary bypass graft: Secondary | ICD-10-CM | POA: Diagnosis not present

## 2020-07-01 DIAGNOSIS — E785 Hyperlipidemia, unspecified: Secondary | ICD-10-CM | POA: Diagnosis not present

## 2020-07-01 DIAGNOSIS — R079 Chest pain, unspecified: Secondary | ICD-10-CM

## 2020-07-01 DIAGNOSIS — I110 Hypertensive heart disease with heart failure: Secondary | ICD-10-CM | POA: Insufficient documentation

## 2020-07-01 DIAGNOSIS — I491 Atrial premature depolarization: Secondary | ICD-10-CM | POA: Diagnosis not present

## 2020-07-01 DIAGNOSIS — I5032 Chronic diastolic (congestive) heart failure: Secondary | ICD-10-CM | POA: Diagnosis not present

## 2020-07-01 DIAGNOSIS — Z743 Need for continuous supervision: Secondary | ICD-10-CM | POA: Diagnosis not present

## 2020-07-01 DIAGNOSIS — J9811 Atelectasis: Secondary | ICD-10-CM | POA: Diagnosis not present

## 2020-07-01 DIAGNOSIS — I2510 Atherosclerotic heart disease of native coronary artery without angina pectoris: Secondary | ICD-10-CM

## 2020-07-01 LAB — CBC WITH DIFFERENTIAL/PLATELET
Abs Immature Granulocytes: 0.05 10*3/uL (ref 0.00–0.07)
Basophils Absolute: 0.1 10*3/uL (ref 0.0–0.1)
Basophils Relative: 1 %
Eosinophils Absolute: 0.2 10*3/uL (ref 0.0–0.5)
Eosinophils Relative: 2 %
HCT: 37.1 % (ref 36.0–46.0)
Hemoglobin: 12.2 g/dL (ref 12.0–15.0)
Immature Granulocytes: 1 %
Lymphocytes Relative: 21 %
Lymphs Abs: 2.3 10*3/uL (ref 0.7–4.0)
MCH: 29.8 pg (ref 26.0–34.0)
MCHC: 32.9 g/dL (ref 30.0–36.0)
MCV: 90.7 fL (ref 80.0–100.0)
Monocytes Absolute: 0.8 10*3/uL (ref 0.1–1.0)
Monocytes Relative: 8 %
Neutro Abs: 7.2 10*3/uL (ref 1.7–7.7)
Neutrophils Relative %: 67 %
Platelets: 279 10*3/uL (ref 150–400)
RBC: 4.09 MIL/uL (ref 3.87–5.11)
RDW: 13 % (ref 11.5–15.5)
WBC: 10.6 10*3/uL — ABNORMAL HIGH (ref 4.0–10.5)
nRBC: 0 % (ref 0.0–0.2)

## 2020-07-01 LAB — GLUCOSE, CAPILLARY
Glucose-Capillary: 206 mg/dL — ABNORMAL HIGH (ref 70–99)
Glucose-Capillary: 133 mg/dL — ABNORMAL HIGH (ref 70–99)

## 2020-07-01 LAB — BASIC METABOLIC PANEL
Anion gap: 13 (ref 5–15)
BUN: 14 mg/dL (ref 8–23)
CO2: 21 mmol/L — ABNORMAL LOW (ref 22–32)
Calcium: 8.8 mg/dL — ABNORMAL LOW (ref 8.9–10.3)
Chloride: 99 mmol/L (ref 98–111)
Creatinine, Ser: 1.41 mg/dL — ABNORMAL HIGH (ref 0.44–1.00)
GFR, Estimated: 38 mL/min — ABNORMAL LOW (ref >60)
Glucose, Bld: 313 mg/dL — ABNORMAL HIGH (ref 70–99)
Potassium: 3.9 mmol/L (ref 3.5–5.1)
Sodium: 133 mmol/L — ABNORMAL LOW (ref 135–145)

## 2020-07-01 LAB — TROPONIN I (HIGH SENSITIVITY)
Troponin I (High Sensitivity): 11 ng/L (ref <18)
Troponin I (High Sensitivity): 13 ng/L (ref <18)

## 2020-07-01 MED ORDER — FUROSEMIDE 20 MG PO TABS
20.0000 mg | ORAL_TABLET | Freq: Every day | ORAL | Status: DC
  Administered 2020-07-02 – 2020-07-03 (×2): 20 mg via ORAL
  Filled 2020-07-01 (×2): qty 1

## 2020-07-01 MED ORDER — ACETAMINOPHEN 325 MG PO TABS
650.0000 mg | ORAL_TABLET | ORAL | Status: DC | PRN
  Administered 2020-07-01 – 2020-07-03 (×4): 650 mg via ORAL
  Filled 2020-07-01 (×4): qty 2

## 2020-07-01 MED ORDER — ONDANSETRON HCL 4 MG/2ML IJ SOLN
4.0000 mg | Freq: Four times a day (QID) | INTRAMUSCULAR | Status: DC | PRN
  Administered 2020-07-01: 4 mg via INTRAVENOUS
  Filled 2020-07-01: qty 2

## 2020-07-01 MED ORDER — PANTOPRAZOLE SODIUM 40 MG PO TBEC
40.0000 mg | DELAYED_RELEASE_TABLET | Freq: Two times a day (BID) | ORAL | Status: DC
  Administered 2020-07-01 – 2020-07-04 (×6): 40 mg via ORAL
  Filled 2020-07-01 (×6): qty 1

## 2020-07-01 MED ORDER — ISOSORBIDE MONONITRATE ER 30 MG PO TB24
15.0000 mg | ORAL_TABLET | Freq: Every day | ORAL | Status: DC
  Administered 2020-07-02 – 2020-07-04 (×3): 15 mg via ORAL
  Filled 2020-07-01 (×3): qty 1

## 2020-07-01 MED ORDER — CLOPIDOGREL BISULFATE 75 MG PO TABS
75.0000 mg | ORAL_TABLET | Freq: Every day | ORAL | Status: DC
  Administered 2020-07-02 – 2020-07-04 (×3): 75 mg via ORAL
  Filled 2020-07-01 (×3): qty 1

## 2020-07-01 MED ORDER — METOPROLOL SUCCINATE ER 25 MG PO TB24
12.5000 mg | ORAL_TABLET | Freq: Every day | ORAL | Status: DC
  Administered 2020-07-02 – 2020-07-04 (×3): 12.5 mg via ORAL
  Filled 2020-07-01 (×3): qty 1

## 2020-07-01 MED ORDER — ACETAMINOPHEN 325 MG PO TABS: 650.0000 mg | ORAL_TABLET | ORAL | Status: DC | PRN

## 2020-07-01 MED ORDER — IBUPROFEN 100 MG PO CHEW: 400.0000 mg | CHEWABLE_TABLET | Freq: Three times a day (TID) | ORAL | Status: DC | PRN

## 2020-07-01 MED ORDER — GABAPENTIN 300 MG PO CAPS
300.0000 mg | ORAL_CAPSULE | Freq: Two times a day (BID) | ORAL | Status: DC
  Administered 2020-07-01 – 2020-07-04 (×6): 300 mg via ORAL
  Filled 2020-07-01 (×6): qty 1

## 2020-07-01 MED ORDER — INSULIN ASPART 100 UNIT/ML ~~LOC~~ SOLN
0.0000 [IU] | Freq: Three times a day (TID) | SUBCUTANEOUS | Status: DC
  Administered 2020-07-01: 5 [IU] via SUBCUTANEOUS
  Administered 2020-07-02: 3 [IU] via SUBCUTANEOUS
  Administered 2020-07-02 – 2020-07-03 (×3): 5 [IU] via SUBCUTANEOUS
  Administered 2020-07-03: 3 [IU] via SUBCUTANEOUS
  Administered 2020-07-03: 11 [IU] via SUBCUTANEOUS
  Administered 2020-07-04: 3 [IU] via SUBCUTANEOUS

## 2020-07-01 MED ORDER — MORPHINE SULFATE (PF) 2 MG/ML IV SOLN
2.0000 mg | Freq: Four times a day (QID) | INTRAVENOUS | Status: DC | PRN
  Administered 2020-07-02 (×2): 2 mg via INTRAVENOUS
  Filled 2020-07-01 (×2): qty 1

## 2020-07-01 MED ORDER — NITROGLYCERIN 0.4 MG SL SUBL
0.4000 mg | SUBLINGUAL_TABLET | SUBLINGUAL | Status: DC | PRN
  Administered 2020-07-03: 0.4 mg via SUBLINGUAL
  Filled 2020-07-01: qty 1

## 2020-07-01 MED ORDER — DAPAGLIFLOZIN PROPANEDIOL 5 MG PO TABS
5.0000 mg | ORAL_TABLET | Freq: Every day | ORAL | Status: DC
  Administered 2020-07-02 – 2020-07-03 (×2): 5 mg via ORAL
  Filled 2020-07-01 (×3): qty 1

## 2020-07-01 MED ORDER — METOCLOPRAMIDE HCL 10 MG PO TABS: 10.0000 mg | ORAL_TABLET | Freq: Four times a day (QID) | ORAL | Status: DC | PRN

## 2020-07-01 MED ORDER — MELATONIN 3 MG PO TABS
6.0000 mg | ORAL_TABLET | Freq: Once | ORAL | Status: AC
  Administered 2020-07-01: 6 mg via ORAL
  Filled 2020-07-01: qty 2

## 2020-07-01 MED ORDER — ONDANSETRON 4 MG PO TBDP: 4.0000 mg | ORAL_TABLET | Freq: Three times a day (TID) | ORAL | Status: DC | PRN

## 2020-07-01 MED ORDER — NITROGLYCERIN IN D5W 200-5 MCG/ML-% IV SOLN
0.0000 ug/min | INTRAVENOUS | Status: DC
  Administered 2020-07-01: 5 ug/min via INTRAVENOUS
  Filled 2020-07-01: qty 250

## 2020-07-01 MED ORDER — METOCLOPRAMIDE HCL 5 MG PO TABS: 5.0000 mg | ORAL_TABLET | Freq: Four times a day (QID) | ORAL | Status: DC | PRN

## 2020-07-01 MED ORDER — ATORVASTATIN CALCIUM 40 MG PO TABS
40.0000 mg | ORAL_TABLET | Freq: Every day | ORAL | Status: DC
  Administered 2020-07-01 – 2020-07-03 (×3): 40 mg via ORAL
  Filled 2020-07-01 (×3): qty 1

## 2020-07-01 MED ORDER — APIXABAN 5 MG PO TABS
5.0000 mg | ORAL_TABLET | Freq: Two times a day (BID) | ORAL | Status: DC
  Administered 2020-07-01 – 2020-07-04 (×6): 5 mg via ORAL
  Filled 2020-07-01 (×6): qty 1

## 2020-07-01 MED ORDER — ALBUTEROL SULFATE HFA 108 (90 BASE) MCG/ACT IN AERS
2.0000 | INHALATION_SPRAY | Freq: Four times a day (QID) | RESPIRATORY_TRACT | Status: DC | PRN
  Filled 2020-07-01: qty 6.7

## 2020-07-01 MED ORDER — INSULIN ASPART 100 UNIT/ML ~~LOC~~ SOLN: 0.0000 [IU] | Freq: Three times a day (TID) | SUBCUTANEOUS | Status: DC

## 2020-07-01 MED ORDER — SODIUM CHLORIDE 0.9 % IV BOLUS
500.0000 mL | Freq: Once | INTRAVENOUS | Status: AC
  Administered 2020-07-01: 500 mL via INTRAVENOUS

## 2020-07-01 MED ORDER — INSULIN GLARGINE 100 UNIT/ML ~~LOC~~ SOLN
5.0000 [IU] | Freq: Every day | SUBCUTANEOUS | Status: DC
  Administered 2020-07-01 – 2020-07-02 (×2): 5 [IU] via SUBCUTANEOUS
  Filled 2020-07-01 (×3): qty 0.05

## 2020-07-01 MED ORDER — LATANOPROST 0.005 % OP SOLN
1.0000 [drp] | Freq: Every day | OPHTHALMIC | Status: DC
  Administered 2020-07-01 – 2020-07-03 (×3): 1 [drp] via OPHTHALMIC
  Filled 2020-07-01: qty 2.5

## 2020-07-01 MED ORDER — ACETAMINOPHEN 325 MG PO TABS: 650.0000 mg | ORAL_TABLET | Freq: Four times a day (QID) | ORAL | Status: DC | PRN
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
## 2020-07-01 NOTE — ED Notes (Signed)
Report called  

## 2020-07-01 NOTE — ED Triage Notes (Signed)
Per EMS: PT started having chest pain last night, worse this morning and radiating to back. EMS gave 0.4 Nitro and pt BP dropped significantly from 140's to 80's. 324 asa and 252mL NS ICF given in route. 20g to left AC.  Hx of triple bypass with stents in 2016.

## 2020-07-01 NOTE — Plan of Care (Addendum)
  Problem: Clinical Measurements: Goal: Will remain free from infection Outcome: Progressing Goal: Diagnostic test results will improve Outcome: Progressing Goal: Cardiovascular complication will be avoided Outcome: Progressing   Problem: Elimination: Goal: Will not experience complications related to bowel motility Outcome: Progressing Goal: Will not experience complications related to urinary retention Outcome: Progressing   Problem: Pain Managment: Goal: General experience of comfort will improve Outcome: Progressing   Problem: Safety: Goal: Ability to remain free from injury will improve Outcome: Progressing  75 yo female admitted to 4E08 for atypical chest pain which she believes is relative to her past MI in 2016.  Nitroglycerin gtt infusing at 5 ml / hr.  Pt is A&O with no specific complaints, but is obviously anxious re:  potential for further cardiac injury.  Requires reassurance frequently. Introduced to staff and to routine.  Is somewhat unsteady on her feet, according to her, and can use a walker at home.  Lives with a long-time friend, "[name]".  She has no children, but has a brother and several cousins.  Has a great deal of support from her significant other.  Bed in low position and call light within reach.

## 2020-07-02 ENCOUNTER — Observation Stay (HOSPITAL_BASED_OUTPATIENT_CLINIC_OR_DEPARTMENT_OTHER): Payer: Medicare Other

## 2020-07-02 DIAGNOSIS — E1140 Type 2 diabetes mellitus with diabetic neuropathy, unspecified: Secondary | ICD-10-CM | POA: Diagnosis not present

## 2020-07-02 DIAGNOSIS — I340 Nonrheumatic mitral (valve) insufficiency: Secondary | ICD-10-CM

## 2020-07-02 DIAGNOSIS — N179 Acute kidney failure, unspecified: Secondary | ICD-10-CM | POA: Diagnosis not present

## 2020-07-02 DIAGNOSIS — E785 Hyperlipidemia, unspecified: Secondary | ICD-10-CM | POA: Diagnosis not present

## 2020-07-02 DIAGNOSIS — Z7984 Long term (current) use of oral hypoglycemic drugs: Secondary | ICD-10-CM | POA: Diagnosis not present

## 2020-07-02 DIAGNOSIS — R079 Chest pain, unspecified: Secondary | ICD-10-CM | POA: Diagnosis not present

## 2020-07-02 DIAGNOSIS — I110 Hypertensive heart disease with heart failure: Secondary | ICD-10-CM | POA: Diagnosis not present

## 2020-07-02 DIAGNOSIS — Z79899 Other long term (current) drug therapy: Secondary | ICD-10-CM | POA: Diagnosis not present

## 2020-07-02 DIAGNOSIS — I25118 Atherosclerotic heart disease of native coronary artery with other forms of angina pectoris: Secondary | ICD-10-CM | POA: Diagnosis not present

## 2020-07-02 DIAGNOSIS — Z7901 Long term (current) use of anticoagulants: Secondary | ICD-10-CM | POA: Diagnosis not present

## 2020-07-02 DIAGNOSIS — I5032 Chronic diastolic (congestive) heart failure: Secondary | ICD-10-CM | POA: Diagnosis not present

## 2020-07-02 DIAGNOSIS — I361 Nonrheumatic tricuspid (valve) insufficiency: Secondary | ICD-10-CM

## 2020-07-02 DIAGNOSIS — R0789 Other chest pain: Secondary | ICD-10-CM | POA: Diagnosis not present

## 2020-07-02 DIAGNOSIS — R072 Precordial pain: Secondary | ICD-10-CM | POA: Diagnosis not present

## 2020-07-02 DIAGNOSIS — Z7951 Long term (current) use of inhaled steroids: Secondary | ICD-10-CM | POA: Diagnosis not present

## 2020-07-02 DIAGNOSIS — I480 Paroxysmal atrial fibrillation: Secondary | ICD-10-CM | POA: Diagnosis not present

## 2020-07-02 LAB — BASIC METABOLIC PANEL
Anion gap: 8 (ref 5–15)
BUN: 14 mg/dL (ref 8–23)
CO2: 23 mmol/L (ref 22–32)
Calcium: 8.5 mg/dL — ABNORMAL LOW (ref 8.9–10.3)
Chloride: 102 mmol/L (ref 98–111)
Creatinine, Ser: 1.42 mg/dL — ABNORMAL HIGH (ref 0.44–1.00)
GFR, Estimated: 38 mL/min — ABNORMAL LOW (ref >60)
Glucose, Bld: 157 mg/dL — ABNORMAL HIGH (ref 70–99)
Potassium: 3.1 mmol/L — ABNORMAL LOW (ref 3.5–5.1)
Sodium: 133 mmol/L — ABNORMAL LOW (ref 135–145)

## 2020-07-02 LAB — CBC
HCT: 35.7 % — ABNORMAL LOW (ref 36.0–46.0)
Hemoglobin: 11.8 g/dL — ABNORMAL LOW (ref 12.0–15.0)
MCH: 29.8 pg (ref 26.0–34.0)
MCHC: 33.1 g/dL (ref 30.0–36.0)
MCV: 90.2 fL (ref 80.0–100.0)
Platelets: 263 10*3/uL (ref 150–400)
RBC: 3.96 MIL/uL (ref 3.87–5.11)
RDW: 13.1 % (ref 11.5–15.5)
WBC: 9.4 10*3/uL (ref 4.0–10.5)
nRBC: 0 % (ref 0.0–0.2)

## 2020-07-02 LAB — ECHOCARDIOGRAM COMPLETE
Area-P 1/2: 2.87 cm2
Height: 62.5 in
S' Lateral: 2.5 cm
Weight: 2250.46 oz

## 2020-07-02 LAB — GLUCOSE, CAPILLARY
Glucose-Capillary: 177 mg/dL — ABNORMAL HIGH (ref 70–99)
Glucose-Capillary: 244 mg/dL — ABNORMAL HIGH (ref 70–99)
Glucose-Capillary: 224 mg/dL — ABNORMAL HIGH (ref 70–99)
Glucose-Capillary: 246 mg/dL — ABNORMAL HIGH (ref 70–99)

## 2020-07-02 MED ORDER — LIP MEDEX EX OINT
TOPICAL_OINTMENT | CUTANEOUS | Status: DC | PRN
  Administered 2020-07-02: 1 via TOPICAL
  Filled 2020-07-02: qty 7

## 2020-07-02 MED ORDER — IBUPROFEN 600 MG PO TABS
600.0000 mg | ORAL_TABLET | Freq: Three times a day (TID) | ORAL | Status: DC
  Administered 2020-07-02 – 2020-07-03 (×4): 600 mg via ORAL
  Filled 2020-07-02 (×4): qty 1

## 2020-07-02 MED ORDER — POTASSIUM CHLORIDE CRYS ER 20 MEQ PO TBCR
40.0000 meq | EXTENDED_RELEASE_TABLET | Freq: Once | ORAL | Status: AC
  Administered 2020-07-02: 40 meq via ORAL
  Filled 2020-07-02: qty 2

## 2020-07-02 NOTE — Progress Notes (Addendum)
Progress Note  Patient Name: Maria Fox Date of Encounter: 07/02/2020  [facility] Cardiologist: Fransico Him, MD   Subjective   Continues with CP; no dyspnea  Inpatient Medications    Scheduled Meds: . apixaban  5 mg Oral BID  . atorvastatin  40 mg Oral Daily  . clopidogrel  75 mg Oral Daily  . dapagliflozin propanediol  5 mg Oral QAC breakfast  . furosemide  20 mg Oral Daily  . gabapentin  300 mg Oral BID  . insulin aspart  0-15 Units Subcutaneous TID WC  . insulin glargine  5 Units Subcutaneous QHS  . isosorbide mononitrate  15 mg Oral Daily  . latanoprost  1 drop Both Eyes QHS  . metoprolol succinate  12.5 mg Oral Daily  . pantoprazole  40 mg Oral BID   Continuous Infusions:  PRN Meds: acetaminophen, albuterol, metoCLOPramide, morphine injection, nitroGLYCERIN, ondansetron (ZOFRAN) IV, ondansetron   Vital Signs    Vitals:   07/01/20 2350 07/02/20 0019 07/02/20 0420 07/02/20 0748  BP: (!) 82/41 (!) 101/56 (!) 110/58 116/72  Pulse: 96 96 95 98  Resp: 20 18 20 18   Temp: 98 F (36.7 C)  98.4 F (36.9 C) 97.7 F (36.5 C)  TempSrc: Oral  Oral Oral  SpO2: 92% 94% 94% 95%  Weight:      Height:        Intake/Output Summary (Last 24 hours) at 07/02/2020 0837 Last data filed at 07/02/2020 0750 Gross per 24 hour  Intake 768.61 ml  Output --  Net 768.61 ml   Last 3 Weights 07/01/2020 06/13/2020 06/06/2020  Weight (lbs) 140 lb 10.5 oz 141 lb 5 oz (No Data)  Weight (kg) 63.8 kg 64.099 kg (No Data)      Telemetry    Sinus with rare PVC and couplet- Personally Reviewed   Physical Exam   GEN: No acute distress.   Neck: No JVD Cardiac: RRR, no murmurs, rubs, or gallops.  Respiratory: Clear to auscultation bilaterally. GI: Soft, nontender, non-distended  MS: No edema Neuro:  Nonfocal  Psych: Normal affect   Labs    High Sensitivity Troponin:   Recent Labs  Lab 07/01/20 1053 07/01/20 1253  TROPONINIHS 11 13      Chemistry Recent Labs  Lab  07/01/20 1053 07/02/20 0055  NA 133* 133*  K 3.9 3.1*  CL 99 102  CO2 21* 23  GLUCOSE 313* 157*  BUN 14 14  CREATININE 1.41* 1.42*  CALCIUM 8.8* 8.5*  GFRNONAA 38* 38*  ANIONGAP 13 8     Hematology Recent Labs  Lab 07/01/20 1053 07/02/20 0055  WBC 10.6* 9.4  RBC 4.09 3.96  HGB 12.2 11.8*  HCT 37.1 35.7*  MCV 90.7 90.2  MCH 29.8 29.8  MCHC 32.9 33.1  RDW 13.0 13.1  PLT 279 263    Radiology    DG Chest Portable 1 View  Result Date: 07/01/2020 CLINICAL DATA:  Chest pain EXAM: PORTABLE CHEST 1 VIEW COMPARISON:  03/27/2020 FINDINGS: Post CABG changes. Loop recorder device is present. Stable cardiomediastinal contours. Atherosclerotic calcification of the aortic knob. A skin fold projects over the right upper lobe. Mild bibasilar atelectasis. No appreciable pleural fluid collection. No pneumothorax. IMPRESSION: Mild bibasilar atelectasis. Electronically Signed   By: Davina Poke D.O.   On: 07/01/2020 11:27    Patient Profile     75 y.o. female with past medical history of coronary artery disease status post coronary artery bypass and graft, chronic diastolic congestive heart  failure, paroxysmal atrial flutter, orthostatic hypotension felt secondary to autonomic dysfunction related to 8 diabetes mellitus, prior CVA admitted with chest pain.  Assessment & Plan    1 chest pain-Continues with CP (persistent now for approximately 48 hours); reproduced with palpation; likely musculoskeletal. PE unlikely due to chronic anticoagulation. Symptoms atypical for dissection. Troponins are normal. Will repeat echo. Continue motrin and MSO4. Wants to stay another day because pain is so intense.   2 coronary artery disease-continue Plavix and statin.  3 history of atrial flutter-patient remains in sinus rhythm.  Continue apixaban and metoprolol.  4 chronic diastolic CHF-euvolemic; continue lasix.  5 Hypertension-continue present BP meds.  6 hyperlipidemia-continue statin.  For  questions or updates, please contact [facility] Please consult [email or web address] for contact info under        Signed, Kirk Ruths, MD  07/02/2020, 8:37 AM

## 2020-07-02 NOTE — Care Management Obs Status (Signed)
[facility] NOTIFICATION   Patient Details  Name: Maria Fox MRN: [id number] Date of Birth: Jul 19, 1945   Medicare Observation Status Notification Given:  Yes    Carles Collet, RN 07/02/2020, 4:12 PM

## 2020-07-02 NOTE — Progress Notes (Signed)
  Echocardiogram 2D Echocardiogram has been performed.  Maria Fox 07/02/2020, 11:42 AM

## 2020-07-03 DIAGNOSIS — I480 Paroxysmal atrial fibrillation: Secondary | ICD-10-CM | POA: Diagnosis not present

## 2020-07-03 DIAGNOSIS — Z7901 Long term (current) use of anticoagulants: Secondary | ICD-10-CM | POA: Diagnosis not present

## 2020-07-03 DIAGNOSIS — I110 Hypertensive heart disease with heart failure: Secondary | ICD-10-CM | POA: Diagnosis not present

## 2020-07-03 DIAGNOSIS — E785 Hyperlipidemia, unspecified: Secondary | ICD-10-CM | POA: Diagnosis not present

## 2020-07-03 DIAGNOSIS — Z79899 Other long term (current) drug therapy: Secondary | ICD-10-CM | POA: Diagnosis not present

## 2020-07-03 DIAGNOSIS — N179 Acute kidney failure, unspecified: Secondary | ICD-10-CM | POA: Diagnosis not present

## 2020-07-03 DIAGNOSIS — Z7951 Long term (current) use of inhaled steroids: Secondary | ICD-10-CM | POA: Diagnosis not present

## 2020-07-03 DIAGNOSIS — E1140 Type 2 diabetes mellitus with diabetic neuropathy, unspecified: Secondary | ICD-10-CM | POA: Diagnosis not present

## 2020-07-03 DIAGNOSIS — R079 Chest pain, unspecified: Secondary | ICD-10-CM

## 2020-07-03 DIAGNOSIS — R0789 Other chest pain: Secondary | ICD-10-CM | POA: Diagnosis not present

## 2020-07-03 DIAGNOSIS — I5032 Chronic diastolic (congestive) heart failure: Secondary | ICD-10-CM | POA: Diagnosis not present

## 2020-07-03 DIAGNOSIS — I25118 Atherosclerotic heart disease of native coronary artery with other forms of angina pectoris: Secondary | ICD-10-CM | POA: Diagnosis not present

## 2020-07-03 DIAGNOSIS — Z7984 Long term (current) use of oral hypoglycemic drugs: Secondary | ICD-10-CM | POA: Diagnosis not present

## 2020-07-03 LAB — GLUCOSE, CAPILLARY
Glucose-Capillary: 238 mg/dL — ABNORMAL HIGH (ref 70–99)
Glucose-Capillary: 315 mg/dL — ABNORMAL HIGH (ref 70–99)
Glucose-Capillary: 186 mg/dL — ABNORMAL HIGH (ref 70–99)
Glucose-Capillary: 202 mg/dL — ABNORMAL HIGH (ref 70–99)

## 2020-07-03 LAB — BASIC METABOLIC PANEL
Anion gap: 10 (ref 5–15)
BUN: 29 mg/dL — ABNORMAL HIGH (ref 8–23)
CO2: 24 mmol/L (ref 22–32)
Calcium: 8.7 mg/dL — ABNORMAL LOW (ref 8.9–10.3)
Chloride: 98 mmol/L (ref 98–111)
Creatinine, Ser: 2.44 mg/dL — ABNORMAL HIGH (ref 0.44–1.00)
GFR, Estimated: 20 mL/min — ABNORMAL LOW (ref >60)
Glucose, Bld: 279 mg/dL — ABNORMAL HIGH (ref 70–99)
Potassium: 4.3 mmol/L (ref 3.5–5.1)
Sodium: 132 mmol/L — ABNORMAL LOW (ref 135–145)

## 2020-07-03 LAB — MAGNESIUM: Magnesium: 1.9 mg/dL (ref 1.7–2.4)

## 2020-07-03 LAB — HEMOGLOBIN A1C
Hgb A1c MFr Bld: 11.6 % — ABNORMAL HIGH (ref 4.8–5.6)
Mean Plasma Glucose: 286.22 mg/dL

## 2020-07-03 MED ORDER — INSULIN GLARGINE 100 UNIT/ML ~~LOC~~ SOLN
10.0000 [IU] | Freq: Every day | SUBCUTANEOUS | Status: DC
  Administered 2020-07-03: 10 [IU] via SUBCUTANEOUS
  Filled 2020-07-03 (×2): qty 0.1

## 2020-07-03 MED ORDER — DICLOFENAC SODIUM 1 % EX GEL
2.0000 g | Freq: Every day | CUTANEOUS | Status: DC
  Administered 2020-07-03 – 2020-07-04 (×2): 2 g via TOPICAL
  Filled 2020-07-03: qty 100

## 2020-07-03 MED ORDER — TRAMADOL HCL 50 MG PO TABS: 50.0000 mg | ORAL_TABLET | Freq: Two times a day (BID) | ORAL | Status: DC | PRN

## 2020-07-03 NOTE — Progress Notes (Addendum)
Progress Note  Patient Name: Maria Fox Date of Encounter: 07/03/2020  [facility] Cardiologist: Fransico Him, MD   Subjective   Patient states she is still having significant chest pain, that is improved with morphine. She reports pain of entire chest wall, radiating to the bilateral shoulders and arms. She reports pain of right chest /breast wall on palpation. She states this is similar to her heart attacks pain.   She wishes more morphine to be given and does not feel ready for discharge, wishes to stay one more night. She denied any paresthesia, nausea, vomiting.   She reports intermittent SOB, denied any orthopnea, weight gain, leg edema.   Inpatient Medications    Scheduled Meds:  apixaban  5 mg Oral BID   atorvastatin  40 mg Oral Daily   clopidogrel  75 mg Oral Daily   dapagliflozin propanediol  5 mg Oral QAC breakfast   diclofenac Sodium  2 g Topical Daily   furosemide  20 mg Oral Daily   gabapentin  300 mg Oral BID   ibuprofen  600 mg Oral TID   insulin aspart  0-15 Units Subcutaneous TID WC   insulin glargine  10 Units Subcutaneous QHS   isosorbide mononitrate  15 mg Oral Daily   latanoprost  1 drop Both Eyes QHS   metoprolol succinate  12.5 mg Oral Daily   pantoprazole  40 mg Oral BID   Continuous Infusions:   PRN Meds: acetaminophen, albuterol, lip balm, metoCLOPramide, morphine injection, nitroGLYCERIN, ondansetron (ZOFRAN) IV, ondansetron   Vital Signs    Vitals:   07/02/20 2300 07/03/20 0404 07/03/20 0500 07/03/20 0751  BP: (!) 91/46 (!) 99/58 101/63 105/66  Pulse: 70 73  95  Resp: 16 16 16 18   Temp: 98.3 F (36.8 C) 98 F (36.7 C)  97.8 F (36.6 C)  TempSrc: Oral Oral  Oral  SpO2: 97% 98%  98%  Weight:      Height:       No intake or output data in the 24 hours ending 07/03/20 0851 Last 3 Weights 07/01/2020 06/13/2020 06/06/2020  Weight (lbs) 140 lb 10.5 oz 141 lb 5 oz (No Data)  Weight (kg) 63.8 kg 64.099 kg (No Data)      Telemetry     Sinus rhythm with rate of 80s-100s, occasional PVCs- Personally Reviewed   Physical Exam   GEN: No acute distress. Mild discomfort.  Neck: No JVD Cardiac: RRR, no murmurs, rubs, or gallops.Right chest/breast wall tenderness on palpation.  Respiratory: Clear to auscultation bilaterally. On room air.  GI: Soft, nontender, non-distended  MS: No BLE edema Neuro:  Alert and oriented x3. Follow commands appropriately. Subjective pain reported of bilateral shoulders and right chest area with muscle strength 5/5.  Psych: Normal affect   Labs    High Sensitivity Troponin:   Recent Labs  Lab 07/01/20 1053 07/01/20 1253  TROPONINIHS 11 13      Chemistry Recent Labs  Lab 07/01/20 1053 07/02/20 0055  NA 133* 133*  K 3.9 3.1*  CL 99 102  CO2 21* 23  GLUCOSE 313* 157*  BUN 14 14  CREATININE 1.41* 1.42*  CALCIUM 8.8* 8.5*  GFRNONAA 38* 38*  ANIONGAP 13 8     Hematology Recent Labs  Lab 07/01/20 1053 07/02/20 0055  WBC 10.6* 9.4  RBC 4.09 3.96  HGB 12.2 11.8*  HCT 37.1 35.7*  MCV 90.7 90.2  MCH 29.8 29.8  MCHC 32.9 33.1  RDW 13.0 13.1  PLT  279 263    Radiology    DG Chest Portable 1 View  Result Date: 07/01/2020 CLINICAL DATA:  Chest pain EXAM: PORTABLE CHEST 1 VIEW COMPARISON:  03/27/2020 FINDINGS: Post CABG changes. Loop recorder device is present. Stable cardiomediastinal contours. Atherosclerotic calcification of the aortic knob. A skin fold projects over the right upper lobe. Mild bibasilar atelectasis. No appreciable pleural fluid collection. No pneumothorax. IMPRESSION: Mild bibasilar atelectasis. Electronically Signed   By: Davina Poke D.O.   On: 07/01/2020 11:27   ECHOCARDIOGRAM COMPLETE  Result Date: 07/02/2020    ECHOCARDIOGRAM REPORT   Patient Name:   Maria Fox Date of Exam: 07/02/2020 Medical Rec #:  [id number]    Height:       62.5 in Accession #:    [id number]   Weight:       140.7 lb Date of Birth:  [birth year]    BSA:          1.656 m Patient  Age:    75 years     BP:           116/72 mmHg Patient Gender: F            HR:           81 bpm. Exam Location:  Inpatient Procedure: 2D Echo Indications:    chest pain  History:        Patient has prior history of Echocardiogram examinations, most                 recent 07/11/2019. CHF, Prior CABG; Risk Factors:Diabetes,                 Hypertension and Dyslipidemia.  Sonographer:    Johny Chess Referring Phys: [place]  1. Left ventricular ejection fraction, by estimation, is 60 to 65%. The left ventricle has normal function. The left ventricle has no regional wall motion abnormalities. There is mild left ventricular hypertrophy. Left ventricular diastolic parameters are consistent with Grade I diastolic dysfunction (impaired relaxation). Elevated left atrial pressure.  2. Right ventricular systolic function is normal. The right ventricular size is normal. There is mildly elevated pulmonary artery systolic pressure.  3. The mitral valve is normal in structure. Mild mitral valve regurgitation. No evidence of mitral stenosis.  4. Tricuspid valve regurgitation is mild to moderate.  5. The aortic valve is tricuspid. Aortic valve regurgitation is not visualized. No aortic stenosis is present.  6. The inferior vena cava is normal in size with greater than 50% respiratory variability, suggesting right atrial pressure of 3 mmHg. FINDINGS  Left Ventricle: Left ventricular ejection fraction, by estimation, is 60 to 65%. The left ventricle has normal function. The left ventricle has no regional wall motion abnormalities. The left ventricular internal cavity size was normal in size. There is  mild left ventricular hypertrophy. Left ventricular diastolic parameters are consistent with Grade I diastolic dysfunction (impaired relaxation). Elevated left atrial pressure. Right Ventricle: The right ventricular size is normal. Right ventricular systolic function is normal. There is mildly elevated  pulmonary artery systolic pressure. The tricuspid regurgitant velocity is 3.12 m/s, and with an assumed right atrial pressure of 3 mmHg, the estimated right ventricular systolic pressure is 99991111 mmHg. Left Atrium: Left atrial size was normal in size. Right Atrium: Right atrial size was normal in size. Pericardium: Trivial pericardial effusion is present. Mitral Valve: The mitral valve is normal in structure. Mild mitral valve regurgitation. No evidence of mitral valve stenosis. Tricuspid  Valve: The tricuspid valve is normal in structure. Tricuspid valve regurgitation is mild to moderate. No evidence of tricuspid stenosis. Aortic Valve: The aortic valve is tricuspid. Aortic valve regurgitation is not visualized. No aortic stenosis is present. Pulmonic Valve: The pulmonic valve was normal in structure. Pulmonic valve regurgitation is not visualized. No evidence of pulmonic stenosis. Aorta: The aortic root is normal in size and structure. Venous: The inferior vena cava is normal in size with greater than 50% respiratory variability, suggesting right atrial pressure of 3 mmHg.   LEFT VENTRICLE PLAX 2D LVIDd:         3.30 cm  Diastology LVIDs:         2.50 cm  LV e' medial:    5.44 cm/s LV PW:         1.20 cm  LV E/e' medial:  20.6 LV IVS:        1.10 cm  LV e' lateral:   4.35 cm/s LVOT diam:     1.80 cm  LV E/e' lateral: 25.7 LV SV:         40 LV SV Index:   24 LVOT Area:     2.54 cm  RIGHT VENTRICLE            IVC RV S prime:     9.90 cm/s  IVC diam: 1.30 cm TAPSE (M-mode): 1.4 cm LEFT ATRIUM             Index       RIGHT ATRIUM           Index LA diam:        3.30 cm 1.99 cm/m  RA Area:     11.00 cm LA Vol (A2C):   27.6 ml 16.67 ml/m RA Volume:   21.20 ml  12.80 ml/m LA Vol (A4C):   42.0 ml 25.36 ml/m LA Biplane Vol: 34.7 ml 20.96 ml/m  AORTIC VALVE LVOT Vmax:   78.80 cm/s LVOT Vmean:  52.000 cm/s LVOT VTI:    0.158 m  AORTA Ao Root diam: 3.20 cm Ao Asc diam:  3.00 cm MITRAL VALVE                TRICUSPID VALVE  MV Area (PHT): 2.87 cm     TR Peak grad:   38.9 mmHg MV Decel Time: 264 msec     TR Vmax:        312.00 cm/s MV E velocity: 112.00 cm/s MV A velocity: 113.00 cm/s  SHUNTS MV E/A ratio:  0.99         Systemic VTI:  0.16 m                             Systemic Diam: 1.80 cm Kirk Ruths MD Electronically signed by Kirk Ruths MD Signature Date/Time: 07/02/2020/12:13:31 PM    Final     Patient Profile     75 y.o. female with PMH of CAD s/p CABG x3 in 2016 and DES to LCX in 06/1322, chronic diastolic heart failure, paroxysmal atrial flutter on Eliquis, hx of NSVT, chronic RBBB,  orthostatic hypotension felt secondary to autonomic dysfunction 2/2 diabetes mellitus, prior CVA, DVT 07/2019, who is admitted for chest pain, cardiology is following at the request of Dr. Regenia Skeeter.   Assessment & Plan    Chest and shoulder pain, atypical CAD with hx of CABG 2016 and PCI 2019 - presented 07/01/20 persistent chest pain for 48 hours, reproduced  with palpation on right chest/breast area to bilateral shoulders today  - Hs trop negative x2 - EKG without acute ischemic changes - Echo from 07/02/20 showed EF 60-65%, no RWMA, mild LVH, grade I DD, RV normal, mild MR, mild/mod TR - pain is likely musculoskeletal in nature  - continue medical therapy with Plavix, Imdur, Metoprolol XL, statin, and PRN nitro - patient was started on 3 days ibuprofen and PRN morphine yesterday, consider deescalating IV morphine - will start Voltaren gel for pain today   Hypokalemia - K 40 meq replaced yesterday for 3.1,  will repeat BMP and Mag today, replete to keep K >4 and Mag >2  Paroxysmal atrial flutter  - currently in SR - CHA2DS2VASc score is 9 due to age, gender, HTN, CHF, DM, CVA, CAD - continue apixaban and metoprolol   Chronic diastolic heart failure - clinically euvolemic, Echo as above - continue GDMT with Metoprolol XL and Lasix 20mg  daily   Hypertension - BP low normal, on low dose metoprolol and lasix and  imdur  Hyperlipidemia  - on statin, LDL 40 on 05/02/20  Type 2 DM - A1C 12.2% from 05/02/20, will repeat A1C today - on farxiga, lantus 10, and novolog SSI currently - home med glipizide and metformin held - [facility] diet     For questions or updates, please contact [facility] Please consult [email or web address] for contact info under    Patient examined chart reviewed. Dry oral mucosa lungs clear no murmur Atypical pain r/o no acute ECG changes Change to oral pain med Tramadol And d/c in am F/U Dr [name] in [place] MD [place], Margie Billet, NP  07/03/2020, 8:51 AM

## 2020-07-04 DIAGNOSIS — R079 Chest pain, unspecified: Secondary | ICD-10-CM | POA: Diagnosis not present

## 2020-07-04 DIAGNOSIS — R0789 Other chest pain: Secondary | ICD-10-CM

## 2020-07-04 DIAGNOSIS — I2510 Atherosclerotic heart disease of native coronary artery without angina pectoris: Secondary | ICD-10-CM

## 2020-07-04 LAB — BASIC METABOLIC PANEL
Anion gap: 11 (ref 5–15)
BUN: 35 mg/dL — ABNORMAL HIGH (ref 8–23)
CO2: 21 mmol/L — ABNORMAL LOW (ref 22–32)
Calcium: 9.2 mg/dL (ref 8.9–10.3)
Chloride: 102 mmol/L (ref 98–111)
Creatinine, Ser: 2.47 mg/dL — ABNORMAL HIGH (ref 0.44–1.00)
GFR, Estimated: 20 mL/min — ABNORMAL LOW (ref >60)
Glucose, Bld: 203 mg/dL — ABNORMAL HIGH (ref 70–99)
Potassium: 4.4 mmol/L (ref 3.5–5.1)
Sodium: 134 mmol/L — ABNORMAL LOW (ref 135–145)

## 2020-07-04 LAB — GLUCOSE, CAPILLARY: Glucose-Capillary: 178 mg/dL — ABNORMAL HIGH (ref 70–99)

## 2020-07-04 MED ORDER — TRAMADOL HCL 50 MG PO TABS: 50.0000 mg | ORAL_TABLET | Freq: Two times a day (BID) | ORAL | 0 refills | Status: AC | PRN

## 2020-07-04 MED ORDER — NITROGLYCERIN 0.4 MG SL SUBL: 0.4000 mg | SUBLINGUAL_TABLET | SUBLINGUAL | 3 refills | Status: DC | PRN

## 2020-07-04 NOTE — Plan of Care (Signed)

## 2020-07-04 NOTE — Plan of Care (Signed)
  Problem: Education: Goal: Knowledge of General Education information will improve Description Including pain rating scale, medication(s)/side effects and non-pharmacologic comfort measures Outcome: Adequate for Discharge   Problem: Health Behavior/Discharge Planning: Goal: Ability to manage health-related needs will improve Outcome: Adequate for Discharge   Problem: Clinical Measurements: Goal: Ability to maintain clinical measurements within normal limits will improve Outcome: Adequate for Discharge Goal: Will remain free from infection Outcome: Adequate for Discharge Goal: Diagnostic test results will improve Outcome: Adequate for Discharge Goal: Respiratory complications will improve Outcome: Adequate for Discharge Goal: Cardiovascular complication will be avoided Outcome: Adequate for Discharge   Problem: Activity: Goal: Risk for activity intolerance will decrease Outcome: Adequate for Discharge   Problem: Nutrition: Goal: Adequate nutrition will be maintained Outcome: Adequate for Discharge   Problem: Coping: Goal: Level of anxiety will decrease Outcome: Adequate for Discharge   Problem: Elimination: Goal: Will not experience complications related to bowel motility Outcome: Adequate for Discharge Goal: Will not experience complications related to urinary retention Outcome: Adequate for Discharge   Problem: Pain Managment: Goal: General experience of comfort will improve Outcome: Adequate for Discharge   Problem: Safety: Goal: Ability to remain free from injury will improve Outcome: Adequate for Discharge   Problem: Skin Integrity: Goal: Risk for impaired skin integrity will decrease Outcome: Adequate for Discharge   Problem: Education: Goal: Understanding of cardiac disease, CV risk reduction, and recovery process will improve Outcome: Adequate for Discharge Goal: Individualized Educational Video(s) Outcome: Adequate for Discharge   Problem:  Activity: Goal: Ability to tolerate increased activity will improve Outcome: Adequate for Discharge   Problem: Cardiac: Goal: Ability to achieve and maintain adequate cardiovascular perfusion will improve Outcome: Adequate for Discharge   Problem: Health Behavior/Discharge Planning: Goal: Ability to safely manage health-related needs after discharge will improve Outcome: Adequate for Discharge   

## 2020-07-04 NOTE — Discharge Summary (Addendum)
Discharge Summary    Patient ID: SURY WENTWORTH MRN: [id number]; DOB: [birth year]  Admit date: 07/01/2020 Discharge date: 07/04/2020  PCP:  [name], [facility]  Cardiologist:  Fransico Him, MD  Advanced Practice Provider:  No care team member to display Electrophysiologist:  None    Discharge Diagnoses    Principal Problem:   Atypical chest pain Active Problems:   Hypertension, essential   DM (diabetes mellitus), type 2, uncontrolled w/neurologic complication ([name])   Chronic diastolic CHF (congestive heart failure) ([name])   CAD (coronary artery disease)    Diagnostic Studies/Procedures    Echo from 07/02/20:   1. Left ventricular ejection fraction, by estimation, is 60 to 65%. The  left ventricle has normal function. The left ventricle has no regional  wall motion abnormalities. There is mild left ventricular hypertrophy.  Left ventricular diastolic parameters  are consistent with Grade I diastolic dysfunction (impaired relaxation).  Elevated left atrial pressure.   2. Right ventricular systolic function is normal. The right ventricular  size is normal. There is mildly elevated pulmonary artery systolic  pressure.   3. The mitral valve is normal in structure. Mild mitral valve  regurgitation. No evidence of mitral stenosis.   4. Tricuspid valve regurgitation is mild to moderate.   5. The aortic valve is tricuspid. Aortic valve regurgitation is not  visualized. No aortic stenosis is present.   6. The inferior vena cava is normal in size with greater than 50%  respiratory variability, suggesting right atrial pressure of 3 mmHg.   _____________   History of Present Illness     Per H&P written on 07/01/20:  Ms. Fujita is a 75 year old female with the above history who is followed by Dr. Radford Pax. Patient presented with NSTEMI in 2016 and was found to have severe 3 vessel disease and underwent CABG x3. Cardiac cath was performed in 11/2017  due to reports of dyspnea on exertion and showed patent LIMA to LAD with occluded SVG to OM3 (not other patent grafts could be found so she was suspected to have an occluded SVG to Diag as well). Patient underwent successful PCI with DES to LCX. Most recent ischemic evaluation was a Myoview in 12/2018 which was low risk with no evidence of ischemia. Patient was admitted in April/May 2021 for acute cholecystitis. She underwent lape chole and post-operatively went into atrial flutter with 2:1 AV block and was also diagnosed with left lower extremity DVT. Echo showed LVEF of 55-60% with normal wall motion, mild MR, and mild TR. She was started on Eliquis and was supposed to follow-up with EP for discussion of ablation but it does not look like this happened. Patient was last seen by Dr. Radford Pax in 01/2020 at which time she continued to have chronic dyspnea on exertion but was stable from a cardiac standpoint.   Patient presents with chest pain.  Patient was in her usual state of health until previous evening around 6 PM when she developed diffuse chest pain that she has difficulty describing.  It persisted throughout the night and she did not sleep well.  Pain worsened in the morning.  It reminded her of her prior cardiac pain before her CABG in 2016 so she called 911.  It occurred at rest she notes that is worse if she moves her arms a certain way and if she takes a deep breath.  No recent travel.  No recent illnesses.  She reports a little shortness  of breath with the pain but no diaphoresis, nausea, or vomiting.  She has chronic dyspnea on exertion but this is stable.  She states she will occasionally have chest pain with activity but this is not very severe.  No recent orthopnea.  She notes occasional PND.  No lower extremity edema.  She has chronic dizziness/lightheadedness but this is stable.  She also reports she is very unsteady on her feet and has balance issues dating back to the time of her CABG.  No  syncope. No abnormal bleeding on Eliquis and Plavix.   EMS gave a 361m of Aspirin and a dose of sublingual Nitro and systolic BP dropped from the 140's to 80's. Therefore, she was given 200cc of normal saline. In the ED: Vitals stable. EKG shows normal sinus rhythm, rate 66 bpm, with LVH, chronic RBBB, and LAFB but no acute ST/T changes. Initial high-sensitivity troponin negative. Chest x-ray showed mild bibasilar atelectasis. WBC 10.6, Hgb 12.2, Plts 279. Na 133, K 3.9, CO2 21, Glucose 313, BUN 14, Cr 1.41. At the time of the evaluation, patient still reported  having 7/10 chest pain but appears comfortable.  Hospital Course     Consultants: none   Atypical chest pain CAD with hx of CABG 2016 and PCI 2019 - presented 07/01/20 persistent chest pain for 48 hours, reproduced with palpation on right chest/breast area to bilateral shoulders   - Hs trop negative x2 - EKG without acute ischemic changes - Echo from 07/02/20 showed EF 60-65%, no RWMA, mild LVH, grade I DD, RV normal, mild MR, mild/mod TR - pain is likely musculoskeletal in nature  - continue medical therapy with Plavix, Imdur, Metoprolol XL, statin, and PRN nitro - Pain was treated with short course PRN ibuprofen, Morphine, and Voltaren gel , transitioned to PO tramadol [id number] PRN, pain is overall improved, will provide 3 days supply tramadol PRN at DC - hospital DC follow up with cardiology Dr TuRadford Paxs arranged on 07/24/20 at 1:40PM   Hypokalemia, resolved  - replaced and resolved, Mag WNL    AKI, new from 07/03/20, nonoliguric  - Cr up to 2.44 from baseline 1.4 on 07/03/20, Cr 2.47 today - lasix held, NSAIDs discontinued since 07/03/20 - suspect over-diuresis/hypotension +NSAID use induced  - discussed with the patient to discontinue/hold Lasix, metformin, farxiga for now, repeat BMP in 1 week, follow up with PCP Dr  GuDanise Minaor lab review before resume above medication, patient understood and agreeable   Paroxysmal atrial  flutter  - remained in SR on telemetry  - CHA2DS2VASc score is 9 due to age, gender, HTN, CHF, DM, CVA, CAD - continue apixaban and metoprolol - patient dose not meeting criteria for low dose apixaban at this time    Chronic diastolic heart failure - clinically euvolemic, Echo as above - continue GDMT with Metoprolol XL, holding Lasix [id number] renal function recover   Hypertension - BP low normal, noted hypotension 84/42 last night x1,  on low dose metoprolol and imdur, holding lasix now as above, follow up with PCP for re-evaluation of BP in 1 week    Hyperlipidemia  - continue statin, LDL 40 on 05/02/20   Type 2 DM - A1C 12.2% from 05/02/20, repeat A1C 11.6% from 07/03/20, improving  - home regimen include farxiga, lantus 10, glipizide, and metformin - advised patient to hold metformin and farxiga for a week until BMP repeat, OK to resume the rest of above regimen, follow up with PCP for further titration  Did the patient have an acute coronary syndrome (MI, NSTEMI, STEMI, etc) this admission?:  No                               Did the patient have a percutaneous coronary intervention (stent / angioplasty)?:  No.       _____________  Discharge Vitals Blood pressure (!) 108/46, pulse 98, temperature 98 F (36.7 C), temperature source Oral, resp. rate 17, height 5' 2.5" (1.588 m), weight 63.8 kg, SpO2 95 %.  Filed Weights   07/01/20 1608  Weight: 63.8 kg   Vitals:  Vitals:   07/04/20 0300 07/04/20 0700  BP:  (!) 108/46  Pulse:  98  Resp:  17  Temp: 98.2 F (36.8 C) 98 F (36.7 C)  SpO2:  95%    Physical Exam:  GEN: No acute distress. Laying in bed.   Neck: No JVD Cardiac: RRR, no murmurs, rubs, or gallops. Right chest/breast wall mild tenderness on palpation, improving.  Respiratory: Clear to auscultation bilaterally. On room air.  GI: Soft, nontender, non-distended  MS: No BLE edema Neuro:  Alert and oriented x3. Follow commands appropriately.  Psych:  Normal affect    Labs & Radiologic Studies    CBC Recent Labs    07/01/20 1053 07/02/20 0055  WBC 10.6* 9.4  NEUTROABS 7.2  --   HGB 12.2 11.8*  HCT 37.1 35.7*  MCV 90.7 90.2  PLT 279 322   Basic Metabolic Panel Recent Labs    07/03/20 0937 07/04/20 0208  NA 132* 134*  K 4.3 4.4  CL 98 102  CO2 24 21*  GLUCOSE 279* 203*  BUN 29* 35*  CREATININE 2.44* 2.47*  CALCIUM 8.7* 9.2  MG 1.9  --    Liver Function Tests No results for input(s): AST, ALT, ALKPHOS, BILITOT, PROT, ALBUMIN in the last 72 hours. No results for input(s): LIPASE, AMYLASE in the last 72 hours. High Sensitivity Troponin:   Recent Labs  Lab 07/01/20 1053 07/01/20 1253  TROPONINIHS 11 13    BNP Invalid input(s): POCBNP D-Dimer No results for input(s): DDIMER in the last 72 hours. Hemoglobin A1C Recent Labs    07/03/20 0937  HGBA1C 11.6*   Fasting Lipid Panel No results for input(s): CHOL, HDL, LDLCALC, TRIG, CHOLHDL, LDLDIRECT in the last 72 hours. Thyroid Function Tests No results for input(s): TSH, T4TOTAL, T3FREE, THYROIDAB in the last 72 hours.  Invalid input(s): FREET3 _____________  DG Chest Portable 1 View  Result Date: 07/01/2020 CLINICAL DATA:  Chest pain EXAM: PORTABLE CHEST 1 VIEW COMPARISON:  03/27/2020 FINDINGS: Post CABG changes. Loop recorder device is present. Stable cardiomediastinal contours. Atherosclerotic calcification of the aortic knob. A skin fold projects over the right upper lobe. Mild bibasilar atelectasis. No appreciable pleural fluid collection. No pneumothorax. IMPRESSION: Mild bibasilar atelectasis. Electronically Signed   By: Davina Poke D.O.   On: 07/01/2020 11:27   ECHOCARDIOGRAM COMPLETE  Result Date: 07/02/2020    ECHOCARDIOGRAM REPORT   Patient Name:   ELYN KROGH Date of Exam: 07/02/2020 Medical Rec #:  [id number]    Height:       62.5 in Accession #:    [id number]   Weight:       140.7 lb Date of Birth:  June 05, 1945    BSA:          1.656 m Patient  Age:    61 years     BP:  116/72 mmHg Patient Gender: F            HR:           81 bpm. Exam Location:  Inpatient Procedure: 2D Echo Indications:    chest pain  History:        Patient has prior history of Echocardiogram examinations, most                 recent 07/11/2019. CHF, Prior CABG; Risk Factors:Diabetes,                 Hypertension and Dyslipidemia.  Sonographer:    Johny Chess Referring Phys: [place]  1. Left ventricular ejection fraction, by estimation, is 60 to 65%. The left ventricle has normal function. The left ventricle has no regional wall motion abnormalities. There is mild left ventricular hypertrophy. Left ventricular diastolic parameters are consistent with Grade I diastolic dysfunction (impaired relaxation). Elevated left atrial pressure.  2. Right ventricular systolic function is normal. The right ventricular size is normal. There is mildly elevated pulmonary artery systolic pressure.  3. The mitral valve is normal in structure. Mild mitral valve regurgitation. No evidence of mitral stenosis.  4. Tricuspid valve regurgitation is mild to moderate.  5. The aortic valve is tricuspid. Aortic valve regurgitation is not visualized. No aortic stenosis is present.  6. The inferior vena cava is normal in size with greater than 50% respiratory variability, suggesting right atrial pressure of 3 mmHg. FINDINGS  Left Ventricle: Left ventricular ejection fraction, by estimation, is 60 to 65%. The left ventricle has normal function. The left ventricle has no regional wall motion abnormalities. The left ventricular internal cavity size was normal in size. There is  mild left ventricular hypertrophy. Left ventricular diastolic parameters are consistent with Grade I diastolic dysfunction (impaired relaxation). Elevated left atrial pressure. Right Ventricle: The right ventricular size is normal. Right ventricular systolic function is normal. There is mildly elevated  pulmonary artery systolic pressure. The tricuspid regurgitant velocity is 3.12 m/s, and with an assumed right atrial pressure of 3 mmHg, the estimated right ventricular systolic pressure is 75.8 mmHg. Left Atrium: Left atrial size was normal in size. Right Atrium: Right atrial size was normal in size. Pericardium: Trivial pericardial effusion is present. Mitral Valve: The mitral valve is normal in structure. Mild mitral valve regurgitation. No evidence of mitral valve stenosis. Tricuspid Valve: The tricuspid valve is normal in structure. Tricuspid valve regurgitation is mild to moderate. No evidence of tricuspid stenosis. Aortic Valve: The aortic valve is tricuspid. Aortic valve regurgitation is not visualized. No aortic stenosis is present. Pulmonic Valve: The pulmonic valve was normal in structure. Pulmonic valve regurgitation is not visualized. No evidence of pulmonic stenosis. Aorta: The aortic root is normal in size and structure. Venous: The inferior vena cava is normal in size with greater than 50% respiratory variability, suggesting right atrial pressure of 3 mmHg.   LEFT VENTRICLE PLAX 2D LVIDd:         3.30 cm  Diastology LVIDs:         2.50 cm  LV e' medial:    5.44 cm/s LV PW:         1.20 cm  LV E/e' medial:  20.6 LV IVS:        1.10 cm  LV e' lateral:   4.35 cm/s LVOT diam:     1.80 cm  LV E/e' lateral: 25.7 LV SV:  40 LV SV Index:   24 LVOT Area:     2.54 cm  RIGHT VENTRICLE            IVC RV S prime:     9.90 cm/s  IVC diam: 1.30 cm TAPSE (M-mode): 1.4 cm LEFT ATRIUM             Index       RIGHT ATRIUM           Index LA diam:        3.30 cm 1.99 cm/m  RA Area:     11.00 cm LA Vol (A2C):   27.6 ml 16.67 ml/m RA Volume:   21.20 ml  12.80 ml/m LA Vol (A4C):   42.0 ml 25.36 ml/m LA Biplane Vol: 34.7 ml 20.96 ml/m  AORTIC VALVE LVOT Vmax:   78.80 cm/s LVOT Vmean:  52.000 cm/s LVOT VTI:    0.158 m  AORTA Ao Root diam: 3.20 cm Ao Asc diam:  3.00 cm MITRAL VALVE                TRICUSPID VALVE  MV Area (PHT): 2.87 cm     TR Peak grad:   38.9 mmHg MV Decel Time: 264 msec     TR Vmax:        312.00 cm/s MV E velocity: 112.00 cm/s MV A velocity: 113.00 cm/s  SHUNTS MV E/A ratio:  0.99         Systemic VTI:  0.16 m                             Systemic Diam: 1.80 cm Kirk Ruths MD Electronically signed by Kirk Ruths MD Signature Date/Time: 07/02/2020/12:13:31 PM    Final    Disposition   Pt is being discharged home today in good condition.  Follow-up Plans & Appointments     Follow-up Information     [name], MD Follow up in 1 week(s).   Specialty: Family Medicine Why: Please call the office and make an appointment in week, obtain blood work BMP within a week Contact information: [place] [address] [phone]         Sueanne Margarita, MD Follow up on 07/24/2020.   Specialty: Cardiology Why: Please arrive 15 min early to 1:40PM on 07/24/20 for your post hospital cardiology follow up appointment  Contact information: [address] [place] [address] [phone]                Discharge Instructions     (HEART FAILURE PATIENTS) Call MD:  Anytime you have any of the following symptoms: 1) 3 pound weight gain in 24 hours or 5 pounds in 1 week 2) shortness of breath, with or without a dry hacking cough 3) swelling in the hands, feet or stomach 4) if you have to sleep on extra pillows at night in order to breathe.   Complete by: As directed    Diet - low sodium heart healthy   Complete by: As directed    Diet Carb Modified   Complete by: As directed    Increase activity slowly   Complete by: As directed        Discharge Medications   Allergies as of 07/04/2020       Reactions   Bee Venom    Throat swelling   Mushroom Extract Complex Anaphylaxis   Penicillins Anaphylaxis, Hives,  Swelling   Has patient had a PCN reaction causing immediate rash, facial/tongue/throat swelling, SOB or lightheadedness with  hypotension: Yes Has patient had a PCN reaction causing severe rash involving mucus membranes or skin necrosis: Yes Has patient had a PCN reaction that required hospitalization Yes Has patient had a PCN reaction occurring within the last 10 years: No If all of the above answers are "NO", then may proceed with Cephalosporin use.   Codeine Nausea And Vomiting   Sulfa Antibiotics Nausea And Vomiting   Iohexol    Erythromycin Base Rash        Medication List     STOP taking these medications    dapagliflozin propanediol 5 MG Tabs tablet Commonly known as: Farxiga   DULoxetine 20 MG capsule Commonly known as: CYMBALTA   furosemide 20 MG tablet Commonly known as: LASIX   metFORMIN 500 MG 24 hr tablet Commonly known as: Glucophage XR       TAKE these medications    acetaminophen 500 MG tablet Commonly known as: TYLENOL Take 500 mg by mouth as needed.   albuterol 108 (90 Base) MCG/ACT inhaler Commonly known as: ProAir HFA Inhale 2 puffs into the lungs every 6 (six) hours as needed for wheezing or shortness of breath.   apixaban 5 MG Tabs tablet Commonly known as: ELIQUIS Take 1 tablet (5 mg total) by mouth 2 (two) times daily.   atorvastatin 40 MG tablet Commonly known as: LIPITOR TAKE 1 TABLET BY MOUTH EVERYDAY AT BEDTIME What changed:  how much to take how to take this when to take this additional instructions   clopidogrel 75 MG tablet Commonly known as: PLAVIX Take 1 tablet (75 mg total) by mouth daily.   docusate sodium 100 MG capsule Commonly known as: COLACE Take 1 capsule (100 mg total) by mouth daily. What changed:  when to take this reasons to take this   gabapentin 300 MG capsule Commonly known as: NEURONTIN Take 1 capsule (300 mg total) by mouth 2 (two) times daily.   glipiZIDE 5 MG tablet Commonly known as: GLUCOTROL TAKE 1 TABLET (5 MG TOTAL) BY MOUTH 2 (TWO) TIMES DAILY BEFORE A MEAL. What changed:  how much to take how to take  this when to take this additional instructions   isosorbide mononitrate 30 MG 24 hr tablet Commonly known as: IMDUR Take 0.5 tablets (15 mg total) by mouth daily.   Lantus SoloStar 100 UNIT/ML Solostar Pen Generic drug: insulin glargine Inject 10 Units into the skin daily.   latanoprost 0.005 % ophthalmic solution Commonly known as: XALATAN Place 1 drop into both eyes at bedtime.   metoCLOPramide 10 MG tablet Commonly known as: REGLAN Take 1 tablet (10 mg total) by mouth every 6 (six) hours as needed for nausea or vomiting.   metoprolol succinate 25 MG 24 hr tablet Commonly known as: Toprol XL Take 0.5 tablets (12.5 mg total) by mouth daily.   nitroGLYCERIN 0.4 MG SL tablet Commonly known as: NITROSTAT Place 1 tablet (0.4 mg total) under the tongue every 5 (five) minutes x 3 doses as needed for chest pain.   ondansetron 4 MG disintegrating tablet Commonly known as: Zofran ODT Take 1 tablet (4 mg total) by mouth every 8 (eight) hours as needed for nausea or vomiting.   ONE TOUCH ULTRA 2 w/Device Kit Use as instructed to check blood sugar 2 times daily as needed.   OneTouch Delica Lancets 64P Misc Use as directed to check blood sugar 2 times daily  as needed   OneTouch Ultra test strip Generic drug: glucose blood Use as instructed to check blood sugar 2 times daily as needed   pantoprazole 40 MG tablet Commonly known as: PROTONIX Take 1 tablet (40 mg total) by mouth 2 (two) times daily.   traMADol 50 MG tablet Commonly known as: ULTRAM Take 1 tablet (50 mg total) by mouth every 12 (twelve) hours as needed for up to 3 days for moderate pain or severe pain.   Vitamin B12 500 MCG Tabs Take 2 tablets by mouth daily.          Outstanding Labs/Studies   Repeat BMP in 1 week     Duration of Discharge Encounter   Greater than 30 minutes including physician time.  Signed, Margie Billet, NP 07/04/2020, 8:57 AM  Patient examined chart reviewed discussed care with  patient and NP. Exam benign less pain Held lasix and NSAI's yesterday Cr stable still above baseline. 2 week script for tramadol given D/c home today f/u arranged. Will see Dr [name] primary for lab work in a week  Jenkins Rouge MD [facility]

## 2020-07-04 NOTE — Discharge Instructions (Signed)

## 2020-07-05 ENCOUNTER — Telehealth: Payer: Self-pay

## 2020-07-05 NOTE — Telephone Encounter (Signed)
Patient requested call to schedule hospital follow up visit. Per discharge notes she needs to be seen in 1 week for BMP. Discharged 07/04/20.  [name], PharmD Clinical Pharmacist [facility] [phone]

## 2020-07-05 NOTE — Telephone Encounter (Signed)
Patient contacted CCM team by telephone for assistance with medications. She is unsure which ones to take after recent discharge from hospital. Reviewed discharge notes from [place]. She gets her medications in adherence packaging from [employer].   ED to hospital admission 07/01/20 for atypical chest pain. Patient discharged on 07/04/20.  Medication Reconciliation: -Medications on hold for 1 week starting 07/05/20: Lasix, metformin, and Farxiga. -New medications: 3 days supply tramadol PRN, nitroglycerin PRN (delivered from [facility] on 07/04/20) -Stop all NSAIDs All other medications remain the same.  Follow up:  - 1 week for labs with PCP (next Tuesday) - Cardiology Dr Radford Pax 07/24/20 at 1:40 PM   She has not taken any medications since discharge yesterday evening 07/04/20. Instructed patient on which medications to remove from the pill packaging for 1 week. Resume all other medications this evening as prescribed.  [name], PharmD Clinical Pharmacist [facility] [phone]

## 2020-07-06 NOTE — Telephone Encounter (Signed)
Encounter details: @CCMTIMESPENT@ @CCMDECISION@ CCM Services: This encounter meets routine CCM services.  Prior to outreach and patient consent for Chronic Care Management, I referred this patient for services after reviewing the nominated patient list or from a personal encounter with the patient.  I have personally reviewed this encounter including the documentation in this note and have collaborated with the care management provider regarding care management and care coordination activities to include development and update of the comprehensive care plan. I am certifying that I agree with the content of this note and encounter as supervising physician.  

## 2020-07-10 ENCOUNTER — Telehealth: Payer: Self-pay

## 2020-07-10 ENCOUNTER — Emergency Department (HOSPITAL_COMMUNITY): Payer: Medicare Other

## 2020-07-10 ENCOUNTER — Inpatient Hospital Stay (HOSPITAL_COMMUNITY)
Admission: EM | Admit: 2020-07-10 | Discharge: 2020-07-27 | DRG: 690 | Disposition: A | Discharge status: Home / Self Care | Payer: Medicare Other | Attending: Internal Medicine | Admitting: Internal Medicine

## 2020-07-10 DIAGNOSIS — N390 Urinary tract infection, site not specified: Secondary | ICD-10-CM | POA: Diagnosis not present

## 2020-07-10 DIAGNOSIS — N1831 Chronic kidney disease, stage 3a: Secondary | ICD-10-CM | POA: Diagnosis present

## 2020-07-10 DIAGNOSIS — Z20822 Contact with and (suspected) exposure to covid-19: Secondary | ICD-10-CM | POA: Diagnosis not present

## 2020-07-10 DIAGNOSIS — Z9842 Cataract extraction status, left eye: Secondary | ICD-10-CM

## 2020-07-10 DIAGNOSIS — Z9049 Acquired absence of other specified parts of digestive tract: Secondary | ICD-10-CM

## 2020-07-10 DIAGNOSIS — Z83438 Family history of other disorder of lipoprotein metabolism and other lipidemia: Secondary | ICD-10-CM

## 2020-07-10 DIAGNOSIS — I5032 Chronic diastolic (congestive) heart failure: Secondary | ICD-10-CM | POA: Diagnosis not present

## 2020-07-10 DIAGNOSIS — E785 Hyperlipidemia, unspecified: Secondary | ICD-10-CM | POA: Diagnosis present

## 2020-07-10 DIAGNOSIS — E1143 Type 2 diabetes mellitus with diabetic autonomic (poly)neuropathy: Secondary | ICD-10-CM | POA: Diagnosis not present

## 2020-07-10 DIAGNOSIS — N3001 Acute cystitis with hematuria: Secondary | ICD-10-CM | POA: Diagnosis not present

## 2020-07-10 DIAGNOSIS — F32A Depression, unspecified: Secondary | ICD-10-CM | POA: Diagnosis not present

## 2020-07-10 DIAGNOSIS — I319 Disease of pericardium, unspecified: Secondary | ICD-10-CM | POA: Diagnosis not present

## 2020-07-10 DIAGNOSIS — I2510 Atherosclerotic heart disease of native coronary artery without angina pectoris: Secondary | ICD-10-CM | POA: Diagnosis present

## 2020-07-10 DIAGNOSIS — R197 Diarrhea, unspecified: Secondary | ICD-10-CM | POA: Diagnosis not present

## 2020-07-10 DIAGNOSIS — R008 Other abnormalities of heart beat: Secondary | ICD-10-CM | POA: Diagnosis not present

## 2020-07-10 DIAGNOSIS — Z1611 Resistance to penicillins: Secondary | ICD-10-CM | POA: Diagnosis present

## 2020-07-10 DIAGNOSIS — E1122 Type 2 diabetes mellitus with diabetic chronic kidney disease: Secondary | ICD-10-CM | POA: Diagnosis present

## 2020-07-10 DIAGNOSIS — Z882 Allergy status to sulfonamides status: Secondary | ICD-10-CM

## 2020-07-10 DIAGNOSIS — B961 Klebsiella pneumoniae [K. pneumoniae] as the cause of diseases classified elsewhere: Secondary | ICD-10-CM | POA: Diagnosis present

## 2020-07-10 DIAGNOSIS — Z833 Family history of diabetes mellitus: Secondary | ICD-10-CM

## 2020-07-10 DIAGNOSIS — Z961 Presence of intraocular lens: Secondary | ICD-10-CM | POA: Diagnosis not present

## 2020-07-10 DIAGNOSIS — Z7901 Long term (current) use of anticoagulants: Secondary | ICD-10-CM

## 2020-07-10 DIAGNOSIS — N3000 Acute cystitis without hematuria: Principal | ICD-10-CM | POA: Diagnosis present

## 2020-07-10 DIAGNOSIS — K5790 Diverticulosis of intestine, part unspecified, without perforation or abscess without bleeding: Secondary | ICD-10-CM | POA: Diagnosis not present

## 2020-07-10 DIAGNOSIS — R112 Nausea with vomiting, unspecified: Secondary | ICD-10-CM | POA: Diagnosis not present

## 2020-07-10 DIAGNOSIS — R1912 Hyperactive bowel sounds: Secondary | ICD-10-CM | POA: Diagnosis present

## 2020-07-10 DIAGNOSIS — I10 Essential (primary) hypertension: Secondary | ICD-10-CM | POA: Diagnosis present

## 2020-07-10 DIAGNOSIS — Z79899 Other long term (current) drug therapy: Secondary | ICD-10-CM

## 2020-07-10 DIAGNOSIS — E876 Hypokalemia: Secondary | ICD-10-CM | POA: Diagnosis not present

## 2020-07-10 DIAGNOSIS — R1110 Vomiting, unspecified: Secondary | ICD-10-CM

## 2020-07-10 DIAGNOSIS — Z91030 Bee allergy status: Secondary | ICD-10-CM

## 2020-07-10 DIAGNOSIS — D71 Functional disorders of polymorphonuclear neutrophils: Secondary | ICD-10-CM | POA: Diagnosis not present

## 2020-07-10 DIAGNOSIS — N183 Chronic kidney disease, stage 3 unspecified: Secondary | ICD-10-CM | POA: Diagnosis not present

## 2020-07-10 DIAGNOSIS — K591 Functional diarrhea: Secondary | ICD-10-CM | POA: Diagnosis not present

## 2020-07-10 DIAGNOSIS — Z8673 Personal history of transient ischemic attack (TIA), and cerebral infarction without residual deficits: Secondary | ICD-10-CM

## 2020-07-10 DIAGNOSIS — Z794 Long term (current) use of insulin: Secondary | ICD-10-CM

## 2020-07-10 DIAGNOSIS — K449 Diaphragmatic hernia without obstruction or gangrene: Secondary | ICD-10-CM | POA: Diagnosis not present

## 2020-07-10 DIAGNOSIS — K219 Gastro-esophageal reflux disease without esophagitis: Secondary | ICD-10-CM | POA: Diagnosis present

## 2020-07-10 DIAGNOSIS — Z8616 Personal history of COVID-19: Secondary | ICD-10-CM | POA: Diagnosis not present

## 2020-07-10 DIAGNOSIS — G43909 Migraine, unspecified, not intractable, without status migrainosus: Secondary | ICD-10-CM | POA: Diagnosis present

## 2020-07-10 DIAGNOSIS — Z86718 Personal history of other venous thrombosis and embolism: Secondary | ICD-10-CM

## 2020-07-10 DIAGNOSIS — R1030 Lower abdominal pain, unspecified: Secondary | ICD-10-CM

## 2020-07-10 DIAGNOSIS — R159 Full incontinence of feces: Secondary | ICD-10-CM | POA: Diagnosis not present

## 2020-07-10 DIAGNOSIS — R110 Nausea: Secondary | ICD-10-CM | POA: Diagnosis present

## 2020-07-10 DIAGNOSIS — D649 Anemia, unspecified: Secondary | ICD-10-CM | POA: Diagnosis present

## 2020-07-10 DIAGNOSIS — N3289 Other specified disorders of bladder: Secondary | ICD-10-CM | POA: Diagnosis not present

## 2020-07-10 DIAGNOSIS — Z743 Need for continuous supervision: Secondary | ICD-10-CM | POA: Diagnosis not present

## 2020-07-10 DIAGNOSIS — K5750 Diverticulosis of both small and large intestine without perforation or abscess without bleeding: Secondary | ICD-10-CM | POA: Diagnosis present

## 2020-07-10 DIAGNOSIS — I480 Paroxysmal atrial fibrillation: Secondary | ICD-10-CM | POA: Diagnosis present

## 2020-07-10 DIAGNOSIS — I4892 Unspecified atrial flutter: Secondary | ICD-10-CM | POA: Diagnosis not present

## 2020-07-10 DIAGNOSIS — Z7902 Long term (current) use of antithrombotics/antiplatelets: Secondary | ICD-10-CM

## 2020-07-10 DIAGNOSIS — Z951 Presence of aortocoronary bypass graft: Secondary | ICD-10-CM

## 2020-07-10 DIAGNOSIS — R0902 Hypoxemia: Secondary | ICD-10-CM | POA: Diagnosis not present

## 2020-07-10 DIAGNOSIS — K5900 Constipation, unspecified: Secondary | ICD-10-CM | POA: Diagnosis not present

## 2020-07-10 DIAGNOSIS — M47816 Spondylosis without myelopathy or radiculopathy, lumbar region: Secondary | ICD-10-CM | POA: Diagnosis not present

## 2020-07-10 DIAGNOSIS — E1165 Type 2 diabetes mellitus with hyperglycemia: Secondary | ICD-10-CM | POA: Diagnosis present

## 2020-07-10 DIAGNOSIS — Z881 Allergy status to other antibiotic agents status: Secondary | ICD-10-CM

## 2020-07-10 DIAGNOSIS — Z87440 Personal history of urinary (tract) infections: Secondary | ICD-10-CM

## 2020-07-10 DIAGNOSIS — Z885 Allergy status to narcotic agent status: Secondary | ICD-10-CM

## 2020-07-10 DIAGNOSIS — Z880 Allergy status to penicillin: Secondary | ICD-10-CM

## 2020-07-10 DIAGNOSIS — I130 Hypertensive heart and chronic kidney disease with heart failure and stage 1 through stage 4 chronic kidney disease, or unspecified chronic kidney disease: Secondary | ICD-10-CM | POA: Diagnosis present

## 2020-07-10 DIAGNOSIS — D7389 Other diseases of spleen: Secondary | ICD-10-CM | POA: Diagnosis not present

## 2020-07-10 DIAGNOSIS — M4306 Spondylolysis, lumbar region: Secondary | ICD-10-CM | POA: Diagnosis present

## 2020-07-10 DIAGNOSIS — R1111 Vomiting without nausea: Secondary | ICD-10-CM | POA: Diagnosis not present

## 2020-07-10 DIAGNOSIS — I252 Old myocardial infarction: Secondary | ICD-10-CM

## 2020-07-10 DIAGNOSIS — I2583 Coronary atherosclerosis due to lipid rich plaque: Secondary | ICD-10-CM | POA: Diagnosis not present

## 2020-07-10 DIAGNOSIS — Z8249 Family history of ischemic heart disease and other diseases of the circulatory system: Secondary | ICD-10-CM

## 2020-07-10 DIAGNOSIS — R109 Unspecified abdominal pain: Secondary | ICD-10-CM

## 2020-07-10 DIAGNOSIS — R195 Other fecal abnormalities: Secondary | ICD-10-CM | POA: Diagnosis not present

## 2020-07-10 DIAGNOSIS — Z91018 Allergy to other foods: Secondary | ICD-10-CM

## 2020-07-10 LAB — URINALYSIS, ROUTINE W REFLEX MICROSCOPIC
Bilirubin Urine: NEGATIVE
Glucose, UA: 150 mg/dL — AB
Ketones, ur: 20 mg/dL — AB
Nitrite: POSITIVE — AB
Protein, ur: 30 mg/dL — AB
Specific Gravity, Urine: 1.023 (ref 1.005–1.030)
WBC, UA: >50 WBC/hpf — ABNORMAL HIGH (ref 0–5)
pH: 5 (ref 5.0–8.0)

## 2020-07-10 LAB — COMPREHENSIVE METABOLIC PANEL
ALT: 17 U/L (ref 0–44)
AST: 27 U/L (ref 15–41)
Albumin: 3.5 g/dL (ref 3.5–5.0)
Alkaline Phosphatase: 84 U/L (ref 38–126)
Anion gap: 10 (ref 5–15)
BUN: 13 mg/dL (ref 8–23)
CO2: 22 mmol/L (ref 22–32)
Calcium: 9.2 mg/dL (ref 8.9–10.3)
Chloride: 104 mmol/L (ref 98–111)
Creatinine, Ser: 1.03 mg/dL — ABNORMAL HIGH (ref 0.44–1.00)
GFR, Estimated: 56 mL/min — ABNORMAL LOW (ref >60)
Glucose, Bld: 239 mg/dL — ABNORMAL HIGH (ref 70–99)
Potassium: 3.3 mmol/L — ABNORMAL LOW (ref 3.5–5.1)
Sodium: 136 mmol/L (ref 135–145)
Total Bilirubin: 0.6 mg/dL (ref 0.3–1.2)
Total Protein: 7.6 g/dL (ref 6.5–8.1)

## 2020-07-10 LAB — CBC WITH DIFFERENTIAL/PLATELET
Abs Immature Granulocytes: 0.04 10*3/uL (ref 0.00–0.07)
Basophils Absolute: 0.1 10*3/uL (ref 0.0–0.1)
Basophils Relative: 1 %
Eosinophils Absolute: 0.2 10*3/uL (ref 0.0–0.5)
Eosinophils Relative: 3 %
HCT: 40.8 % (ref 36.0–46.0)
Hemoglobin: 13.5 g/dL (ref 12.0–15.0)
Immature Granulocytes: 1 %
Lymphocytes Relative: 25 %
Lymphs Abs: 1.8 10*3/uL (ref 0.7–4.0)
MCH: 29.8 pg (ref 26.0–34.0)
MCHC: 33.1 g/dL (ref 30.0–36.0)
MCV: 90.1 fL (ref 80.0–100.0)
Monocytes Absolute: 0.4 10*3/uL (ref 0.1–1.0)
Monocytes Relative: 6 %
Neutro Abs: 4.7 10*3/uL (ref 1.7–7.7)
Neutrophils Relative %: 64 %
Platelets: 340 10*3/uL (ref 150–400)
RBC: 4.53 MIL/uL (ref 3.87–5.11)
RDW: 13.2 % (ref 11.5–15.5)
WBC: 7.2 10*3/uL (ref 4.0–10.5)
nRBC: 0 % (ref 0.0–0.2)

## 2020-07-10 LAB — CBG MONITORING, ED
Glucose-Capillary: 166 mg/dL — ABNORMAL HIGH (ref 70–99)
Glucose-Capillary: 172 mg/dL — ABNORMAL HIGH (ref 70–99)
Glucose-Capillary: 231 mg/dL — ABNORMAL HIGH (ref 70–99)

## 2020-07-10 LAB — LIPASE, BLOOD: Lipase: 32 U/L (ref 11–51)

## 2020-07-10 MED ORDER — ONDANSETRON HCL 4 MG/2ML IJ SOLN
4.0000 mg | Freq: Once | INTRAMUSCULAR | Status: AC
  Administered 2020-07-10: 4 mg via INTRAVENOUS
  Filled 2020-07-10: qty 2

## 2020-07-10 MED ORDER — APIXABAN 5 MG PO TABS
5.0000 mg | ORAL_TABLET | Freq: Two times a day (BID) | ORAL | Status: DC
  Administered 2020-07-10 – 2020-07-27 (×34): 5 mg via ORAL
  Filled 2020-07-10 (×35): qty 1

## 2020-07-10 MED ORDER — INSULIN ASPART 100 UNIT/ML IJ SOLN
0.0000 [IU] | Freq: Three times a day (TID) | INTRAMUSCULAR | Status: DC
  Administered 2020-07-11: 5 [IU] via SUBCUTANEOUS
  Administered 2020-07-11: 3 [IU] via SUBCUTANEOUS
  Administered 2020-07-12 – 2020-07-13 (×2): 2 [IU] via SUBCUTANEOUS
  Administered 2020-07-13: 3 [IU] via SUBCUTANEOUS
  Administered 2020-07-14 (×2): 2 [IU] via SUBCUTANEOUS
  Administered 2020-07-15: 3 [IU] via SUBCUTANEOUS
  Administered 2020-07-16: 2 [IU] via SUBCUTANEOUS
  Administered 2020-07-16: 3 [IU] via SUBCUTANEOUS
  Administered 2020-07-17: 2 [IU] via SUBCUTANEOUS
  Administered 2020-07-17 – 2020-07-18 (×2): 8 [IU] via SUBCUTANEOUS
  Administered 2020-07-18: 3 [IU] via SUBCUTANEOUS
  Administered 2020-07-18: 2 [IU] via SUBCUTANEOUS
  Administered 2020-07-19 (×2): 5 [IU] via SUBCUTANEOUS
  Administered 2020-07-19 – 2020-07-20 (×2): 3 [IU] via SUBCUTANEOUS
  Administered 2020-07-20: 8 [IU] via SUBCUTANEOUS
  Administered 2020-07-20: 3 [IU] via SUBCUTANEOUS
  Administered 2020-07-21: 5 [IU] via SUBCUTANEOUS
  Administered 2020-07-21: 2 [IU] via SUBCUTANEOUS
  Administered 2020-07-21 – 2020-07-22 (×2): 5 [IU] via SUBCUTANEOUS
  Administered 2020-07-22: 3 [IU] via SUBCUTANEOUS
  Administered 2020-07-22: 2 [IU] via SUBCUTANEOUS
  Administered 2020-07-23: 3 [IU] via SUBCUTANEOUS
  Administered 2020-07-23: 2 [IU] via SUBCUTANEOUS
  Administered 2020-07-23 – 2020-07-24 (×2): 3 [IU] via SUBCUTANEOUS
  Administered 2020-07-25: 1 [IU] via SUBCUTANEOUS
  Administered 2020-07-26: 8 [IU] via SUBCUTANEOUS
  Administered 2020-07-26 – 2020-07-27 (×2): 3 [IU] via SUBCUTANEOUS
  Filled 2020-07-10: qty 0.15

## 2020-07-10 MED ORDER — GABAPENTIN 300 MG PO CAPS
300.0000 mg | ORAL_CAPSULE | Freq: Two times a day (BID) | ORAL | Status: DC
  Administered 2020-07-10 – 2020-07-27 (×34): 300 mg via ORAL
  Filled 2020-07-10 (×34): qty 1

## 2020-07-10 MED ORDER — KETOROLAC TROMETHAMINE 15 MG/ML IJ SOLN
15.0000 mg | Freq: Four times a day (QID) | INTRAMUSCULAR | Status: AC | PRN
  Administered 2020-07-11 – 2020-07-13 (×4): 15 mg via INTRAVENOUS
  Filled 2020-07-10 (×4): qty 1

## 2020-07-10 MED ORDER — ONDANSETRON HCL 4 MG PO TABS: 4.0000 mg | ORAL_TABLET | Freq: Four times a day (QID) | ORAL | Status: DC | PRN

## 2020-07-10 MED ORDER — MORPHINE SULFATE (PF) 4 MG/ML IV SOLN
4.0000 mg | Freq: Once | INTRAVENOUS | Status: AC
  Administered 2020-07-10: 4 mg via INTRAVENOUS
  Filled 2020-07-10: qty 1

## 2020-07-10 MED ORDER — CLOPIDOGREL BISULFATE 75 MG PO TABS
75.0000 mg | ORAL_TABLET | Freq: Every day | ORAL | Status: DC
  Administered 2020-07-11 – 2020-07-27 (×17): 75 mg via ORAL
  Filled 2020-07-10 (×17): qty 1

## 2020-07-10 MED ORDER — VITAMIN B-12 1000 MCG PO TABS
1000.0000 ug | ORAL_TABLET | Freq: Every day | ORAL | Status: DC
  Administered 2020-07-11 – 2020-07-27 (×17): 1000 ug via ORAL
  Filled 2020-07-10 (×18): qty 1

## 2020-07-10 MED ORDER — SODIUM CHLORIDE 0.9 % IV BOLUS
1000.0000 mL | Freq: Once | INTRAVENOUS | Status: AC
  Administered 2020-07-10: 1000 mL via INTRAVENOUS

## 2020-07-10 MED ORDER — GLIPIZIDE 5 MG PO TABS
5.0000 mg | ORAL_TABLET | Freq: Two times a day (BID) | ORAL | Status: DC
  Administered 2020-07-11 – 2020-07-16 (×11): 5 mg via ORAL
  Filled 2020-07-10 (×13): qty 1

## 2020-07-10 MED ORDER — LATANOPROST 0.005 % OP SOLN
1.0000 [drp] | Freq: Every day | OPHTHALMIC | Status: DC
  Administered 2020-07-10 – 2020-07-26 (×17): 1 [drp] via OPHTHALMIC
  Filled 2020-07-10 (×2): qty 2.5

## 2020-07-10 MED ORDER — DAPAGLIFLOZIN PROPANEDIOL 5 MG PO TABS
5.0000 mg | ORAL_TABLET | Freq: Every day | ORAL | Status: DC
  Filled 2020-07-10: qty 1

## 2020-07-10 MED ORDER — ACETAMINOPHEN 500 MG PO TABS
500.0000 mg | ORAL_TABLET | ORAL | Status: DC | PRN
  Administered 2020-07-11 – 2020-07-18 (×12): 500 mg via ORAL
  Filled 2020-07-10 (×13): qty 1

## 2020-07-10 MED ORDER — POTASSIUM CHLORIDE IN NACL 40-0.9 MEQ/L-% IV SOLN
INTRAVENOUS | Status: DC
  Administered 2020-07-12: 125 mL/h via INTRAVENOUS
  Filled 2020-07-10 (×6): qty 1000

## 2020-07-10 MED ORDER — METOCLOPRAMIDE HCL 10 MG PO TABS
10.0000 mg | ORAL_TABLET | Freq: Four times a day (QID) | ORAL | Status: DC | PRN
  Administered 2020-07-11 – 2020-07-12 (×2): 10 mg via ORAL
  Filled 2020-07-10 (×2): qty 1

## 2020-07-10 MED ORDER — MORPHINE SULFATE (PF) 4 MG/ML IV SOLN
4.0000 mg | Freq: Once | INTRAVENOUS | Status: AC
Start: 2020-07-10 — End: 2020-07-10
  Administered 2020-07-10: 4 mg via INTRAVENOUS
  Filled 2020-07-10: qty 1

## 2020-07-10 MED ORDER — SODIUM CHLORIDE 0.9 % IV SOLN
1.0000 g | Freq: Three times a day (TID) | INTRAVENOUS | Status: DC
  Administered 2020-07-10 – 2020-07-11 (×2): 1 g via INTRAVENOUS
  Filled 2020-07-10 (×2): qty 1

## 2020-07-10 MED ORDER — SODIUM CHLORIDE 0.9 % IV SOLN
1.0000 g | Freq: Once | INTRAVENOUS | Status: AC
  Administered 2020-07-10: 1 g via INTRAVENOUS
  Filled 2020-07-10: qty 10

## 2020-07-10 MED ORDER — PANTOPRAZOLE SODIUM 40 MG PO TBEC
40.0000 mg | DELAYED_RELEASE_TABLET | Freq: Two times a day (BID) | ORAL | Status: DC
  Administered 2020-07-10 – 2020-07-12 (×4): 40 mg via ORAL
  Filled 2020-07-10 (×4): qty 1

## 2020-07-10 MED ORDER — NITROGLYCERIN 0.4 MG SL SUBL: 0.4000 mg | SUBLINGUAL_TABLET | SUBLINGUAL | Status: DC | PRN

## 2020-07-10 MED ORDER — ONDANSETRON HCL 4 MG/2ML IJ SOLN
4.0000 mg | Freq: Four times a day (QID) | INTRAMUSCULAR | Status: DC | PRN
  Administered 2020-07-11 – 2020-07-13 (×3): 4 mg via INTRAVENOUS
  Filled 2020-07-10 (×3): qty 2

## 2020-07-10 MED ORDER — DULOXETINE HCL 20 MG PO CPEP
20.0000 mg | ORAL_CAPSULE | Freq: Every day | ORAL | Status: DC
  Administered 2020-07-11 – 2020-07-27 (×18): 20 mg via ORAL
  Filled 2020-07-10 (×18): qty 1

## 2020-07-10 MED ORDER — ISOSORBIDE MONONITRATE ER 30 MG PO TB24
15.0000 mg | ORAL_TABLET | Freq: Every day | ORAL | Status: DC
  Administered 2020-07-11 – 2020-07-27 (×17): 15 mg via ORAL
  Filled 2020-07-10 (×18): qty 1

## 2020-07-10 MED ORDER — FUROSEMIDE 20 MG PO TABS
20.0000 mg | ORAL_TABLET | Freq: Every day | ORAL | Status: DC
  Administered 2020-07-11 – 2020-07-16 (×6): 20 mg via ORAL
  Filled 2020-07-10 (×6): qty 1

## 2020-07-10 MED ORDER — INSULIN ASPART 100 UNIT/ML IJ SOLN
0.0000 [IU] | Freq: Every day | INTRAMUSCULAR | Status: DC
  Administered 2020-07-19: 2 [IU] via SUBCUTANEOUS
  Filled 2020-07-10: qty 0.05

## 2020-07-10 MED ORDER — INSULIN GLARGINE 100 UNIT/ML ~~LOC~~ SOLN
10.0000 [IU] | Freq: Every day | SUBCUTANEOUS | Status: DC
  Administered 2020-07-11 – 2020-07-27 (×16): 10 [IU] via SUBCUTANEOUS
  Filled 2020-07-10 (×19): qty 0.1

## 2020-07-10 MED ORDER — DOCUSATE SODIUM 100 MG PO CAPS: 100.0000 mg | ORAL_CAPSULE | Freq: Every day | ORAL | Status: DC | PRN

## 2020-07-10 MED ORDER — METOPROLOL SUCCINATE ER 25 MG PO TB24
12.5000 mg | ORAL_TABLET | Freq: Every day | ORAL | Status: DC
  Administered 2020-07-11 – 2020-07-27 (×17): 12.5 mg via ORAL
  Filled 2020-07-10 (×15): qty 1
  Filled 2020-07-10: qty 0.5
  Filled 2020-07-10 (×2): qty 1

## 2020-07-10 MED ORDER — ONDANSETRON 4 MG PO TBDP: 4.0000 mg | ORAL_TABLET | Freq: Three times a day (TID) | ORAL | Status: DC | PRN

## 2020-07-10 MED ORDER — ATORVASTATIN CALCIUM 40 MG PO TABS
40.0000 mg | ORAL_TABLET | Freq: Every day | ORAL | Status: DC
  Administered 2020-07-10 – 2020-07-26 (×17): 40 mg via ORAL
  Filled 2020-07-10 (×17): qty 1

## 2020-07-10 MED ORDER — ALBUTEROL SULFATE HFA 108 (90 BASE) MCG/ACT IN AERS
2.0000 | INHALATION_SPRAY | Freq: Four times a day (QID) | RESPIRATORY_TRACT | Status: DC | PRN
  Administered 2020-07-24: 2 via RESPIRATORY_TRACT
  Filled 2020-07-10: qty 6.7
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
## 2020-07-10 NOTE — ED Notes (Signed)
Pt given ice water.

## 2020-07-10 NOTE — ED Notes (Signed)
Updated pt significant other: [name].

## 2020-07-10 NOTE — ED Triage Notes (Signed)
Pt came from home via EMS. C/C: diffuse abdominal pain x3 days. Nausea without vomiting. Diarrhea x 3 days, began as dark brown stools, now bright yellow stools. Pain 9/10. PMH of strokes, MI. Pt ambulatory with EMS. VS in normal limits. NSR.  CBG: 274

## 2020-07-10 NOTE — Telephone Encounter (Signed)
Maria Fox called to request something be done before her upcoming OV 07/19/2020... He reports she is not able to control her bowels, no appetite, abdominal pain... would like to know if she can be seen before or what she needs to do...Marland Kitchen please advise

## 2020-07-10 NOTE — ED Notes (Signed)
ED TO INPATIENT HANDOFF REPORT  Name/Age/Gender Maria Fox 75 y.o. female  Code Status    Code Status Orders  (From admission, onward)         Start     Ordered   07/10/20 2313  Full code  Continuous        07/10/20 2312        Code Status History    Date Active Date Inactive Code Status Order ID Comments User Context   07/01/2020 1634 07/04/2020 1713 Full Code [id number]  Eppie Gibson Inpatient   07/07/2019 0858 07/23/2019 2148 Full Code [id number]  Karmen Bongo, MD ED   11/12/2017 1649 11/13/2017 1647 Full Code [id number]  Wellington Hampshire, MD Inpatient   05/27/2016 1914 05/28/2016 1344 Full Code [id number]  Fredia Sorrow, MD ED   05/27/2016 1500 05/27/2016 1914 Full Code [id number]  Lorin Glass, [place] ED   04/21/2016 2142 04/23/2016 2200 Full Code [id number]  Margarita Mail, PA-C ED   02/16/2016 1852 02/19/2016 0135 Full Code [id number]  Alfonzo Beers, MD ED   07/26/2015 0014 07/27/2015 1655 Full Code [id number]  Norval Morton, MD ED   07/09/2015 0730 07/18/2015 1620 Full Code [id number]  Radene Gunning, NP ED   09/01/2014 1831 07/09/2015 0730 Full Code [id number]  Hennie Duos, MD Outpatient   08/27/2014 1917 09/01/2014 1831 DNR [id number]  Hennie Duos, MD Outpatient   08/07/2014 1821 08/22/2014 1943 Full Code [id number]  Kinnie Feil, MD Inpatient   07/08/2014 0153 07/18/2014 1757 Full Code [id number]  Lavina Hamman, MD Inpatient   06/02/2012 0324 06/03/2012 1618 Full Code [id number]  Rise Patience, MD Inpatient   06/23/2011 1933 06/24/2011 1703 Full Code [id number]  Guinevere Ferrari, RN Inpatient   05/27/2011 0442 05/27/2011 2145 Full Code [id number]  Jeryl Columbia ED   05/27/2011 0414 05/27/2011 0442 Full Code [id number]  Sharyon Cable, MD ED   Advance Care Planning Activity      Home/SNF/Other Home  Chief Complaint Acute cystitis [N30.00]  Level of Care/Admitting Diagnosis ED Disposition    ED Disposition Condition [place]:  [facility] [id number]  Level of Care: Telemetry [5]  Admit to tele based on following criteria: Complex arrhythmia (Bradycardia/Tachycardia)  May admit patient to [facility] or [facility] if equivalent level of care is available:: Yes  Covid Evaluation: Asymptomatic Screening Protocol (No Symptoms)  Diagnosis: Acute cystitis [595.0.ICD-9-CM]  Admitting Physician: Elwyn Reach [[id number]]  Attending Physician: Elwyn Reach [[id number]]  Estimated length of stay: past midnight tomorrow  Certification:: I certify this patient will need inpatient services for at least 2 midnights       Medical History Past Medical History:  Diagnosis Date  . Acute deep vein thrombosis (DVT) of left tibial vein ([place]) 07/11/2019  . Acute left PCA stroke ([place]) 07/13/2015  . Angioedema   . Autoimmune deficiency syndrome ([place])   . CAD (coronary artery disease), native coronary artery 06/2014   s/p NSTEMI with cath showing severe diffuse disease of the mid LAD, occluded large diagonal, 80-90% prox OM and normal dominant RCA. S/P CABG x 3 2016  . Closed fracture of maxilla ([place])   . COVID-19 virus infection 03/2020  . CVA (cerebral infarction) 07/2014   bilateral corona radiata - periCABG  . Dermatitis    eval [name] 2011: eczema, eval [name] 2011: bx negative for lichen simplex or derm herpetiformis  . Diastolic CHF ([place]) [id number]  .  DM (diabetes mellitus), type 2, uncontrolled w/neurologic complication ([place]) [id number]   ?autonomic neuropathy, gastroparesis (06/2014)   . Epidermal cyst of neck 03/25/2017   Excised by derm Domenic Polite)  . HCAP (healthcare-associated pneumonia) 06/2014  . History of chicken pox   . HLD (hyperlipidemia)   . Hx of migraines    remote  . Hypertension   . Maxillary fracture ([place])   . Multiple allergies    mold, wool, dust, feathers  . Orthostatic hypotension 07/2015  . Pleural effusion, left   . S/P lens implant    left side ([name])  . UTI (urinary tract  infection) 06/2014  . Vitiligo     Allergies Allergies  Allergen Reactions  . Bee Venom     Throat swelling  . Mushroom Extract Complex Anaphylaxis  . Penicillins Anaphylaxis, Hives and Swelling    Has patient had a PCN reaction causing immediate rash, facial/tongue/throat swelling, SOB or lightheadedness with hypotension: Yes Has patient had a PCN reaction causing severe rash involving mucus membranes or skin necrosis: Yes Has patient had a PCN reaction that required hospitalization Yes Has patient had a PCN reaction occurring within the last 10 years: No If all of the above answers are "NO", then may proceed with Cephalosporin use.   . Codeine Nausea And Vomiting  . Sulfa Antibiotics Nausea And Vomiting  . Iohexol   . Erythromycin Base Rash    IV Location/Drains/Wounds Patient Lines/Drains/Airways Status    Active Line/Drains/Airways    Name Placement date Placement time Site Days   Peripheral IV 07/10/20 Right Antecubital 07/10/20  1536  Antecubital  less than 1   Incision - 4 Ports Abdomen 1: Umbilicus 2: Mid;Upper 3: Right;Medial 4: Right;Lateral 07/07/19  1410  -- 369          Labs/Imaging Results for orders placed or performed during the hospital encounter of 07/10/20 (from the past 48 hour(s))  CBG monitoring, ED     Status: Abnormal   Collection Time: 07/10/20  3:26 PM  Result Value Ref Range   Glucose-Capillary 231 (H) 70 - 99 mg/dL    Comment: Glucose reference range applies only to samples taken after fasting for at least 8 hours.  CBC with Differential     Status: None   Collection Time: 07/10/20  3:45 PM  Result Value Ref Range   WBC 7.2 4.0 - 10.5 K/uL   RBC 4.53 3.87 - 5.11 MIL/uL   Hemoglobin 13.5 12.0 - 15.0 g/dL   HCT 40.8 36.0 - 46.0 %   MCV 90.1 80.0 - 100.0 fL   MCH 29.8 26.0 - 34.0 pg   MCHC 33.1 30.0 - 36.0 g/dL   RDW 13.2 11.5 - 15.5 %   Platelets 340 150 - 400 K/uL   nRBC 0.0 0.0 - 0.2 %   Neutrophils Relative % 64 %   Neutro Abs 4.7 1.7  - 7.7 K/uL   Lymphocytes Relative 25 %   Lymphs Abs 1.8 0.7 - 4.0 K/uL   Monocytes Relative 6 %   Monocytes Absolute 0.4 0.1 - 1.0 K/uL   Eosinophils Relative 3 %   Eosinophils Absolute 0.2 0.0 - 0.5 K/uL   Basophils Relative 1 %   Basophils Absolute 0.1 0.0 - 0.1 K/uL   Immature Granulocytes 1 %   Abs Immature Granulocytes 0.04 0.00 - 0.07 K/uL    Comment: Performed at [facility], [place] [address], [place], [place] [address]  Comprehensive metabolic panel     Status: Abnormal  Collection Time: 07/10/20  3:45 PM  Result Value Ref Range   Sodium 136 135 - 145 mmol/L   Potassium 3.3 (L) 3.5 - 5.1 mmol/L   Chloride 104 98 - 111 mmol/L   CO2 22 22 - 32 mmol/L   Glucose, Bld 239 (H) 70 - 99 mg/dL    Comment: Glucose reference range applies only to samples taken after fasting for at least 8 hours.   BUN 13 8 - 23 mg/dL   Creatinine, Ser 1.03 (H) 0.44 - 1.00 mg/dL   Calcium 9.2 8.9 - 10.3 mg/dL   Total Protein 7.6 6.5 - 8.1 g/dL   Albumin 3.5 3.5 - 5.0 g/dL   AST 27 15 - 41 U/L   ALT 17 0 - 44 U/L   Alkaline Phosphatase 84 38 - 126 U/L   Total Bilirubin 0.6 0.3 - 1.2 mg/dL   GFR, Estimated 56 (L) >60 mL/min    Comment: (NOTE) Calculated using the CKD-EPI Creatinine Equation (2021)    Anion gap 10 5 - 15    Comment: Performed at [facility], [place] [address] [place], [place] [address]  Lipase, blood     Status: None   Collection Time: 07/10/20  3:45 PM  Result Value Ref Range   Lipase 32 11 - 51 U/L    Comment: Performed at [facility], [place] [address], [place], [place] [address]  CBG monitoring, ED     Status: Abnormal   Collection Time: 07/10/20  6:19 PM  Result Value Ref Range   Glucose-Capillary 172 (H) 70 - 99 mg/dL    Comment: Glucose reference range applies only to samples taken after fasting for at least 8 hours.  Urinalysis, Routine w reflex microscopic Urine, Clean Catch     Status: Abnormal   Collection Time:  07/10/20  6:23 PM  Result Value Ref Range   Color, Urine YELLOW YELLOW   APPearance HAZY (A) CLEAR   Specific Gravity, Urine 1.023 1.005 - 1.030   pH 5.0 5.0 - 8.0   Glucose, UA 150 (A) NEGATIVE mg/dL   Hgb urine dipstick MODERATE (A) NEGATIVE   Bilirubin Urine NEGATIVE NEGATIVE   Ketones, ur 20 (A) NEGATIVE mg/dL   Protein, ur 30 (A) NEGATIVE mg/dL   Nitrite POSITIVE (A) NEGATIVE   Leukocytes,Ua MODERATE (A) NEGATIVE   RBC / HPF 21-50 0 - 5 RBC/hpf   WBC, UA >50 (H) 0 - 5 WBC/hpf   Bacteria, UA MANY (A) NONE SEEN   Squamous Epithelial / LPF 6-10 0 - 5   Mucus PRESENT    Hyaline Casts, UA PRESENT     Comment: Performed at [facility], [place] [address] [place], [place] [address]   CT ABDOMEN PELVIS WO CONTRAST  Result Date: 07/10/2020 CLINICAL DATA:  Acute abdominal pain EXAM: CT ABDOMEN AND PELVIS WITHOUT CONTRAST TECHNIQUE: Multidetector CT imaging of the abdomen and pelvis was performed following the standard protocol without IV contrast. COMPARISON:  03/27/2020 FINDINGS: Lower chest: No acute abnormality. Chronic pericardial thickening is noted. Hepatobiliary: No focal liver abnormality is seen. Status post cholecystectomy. No biliary dilatation. Pancreas: Unremarkable. No pancreatic ductal dilatation or surrounding inflammatory changes. Spleen: Multiple calcified granulomas are noted Adrenals/Urinary Tract: Adrenal glands are within normal limits. No renal calculi are noted. No obstructive changes are seen. The bladder is partially distended. Stomach/Bowel: The appendix is within normal limits. No obstructive or inflammatory changes of the colon are seen. Mild diverticular changes noted without diverticulitis. Small  bowel and stomach appear within normal limits. Vascular/Lymphatic: Aortic atherosclerosis. No enlarged abdominal or pelvic lymph nodes. Reproductive: Uterus and bilateral adnexa are unremarkable. Other: No abdominal wall hernia or abnormality. No  abdominopelvic ascites. Musculoskeletal: No acute or significant osseous findings. IMPRESSION: Diverticulosis without diverticulitis. Changes of prior granulomatous disease. Chronic thickening of the pericardium. This is stable dating back to 2016 and likely related to prior cardiac surgery. Electronically Signed   By: Inez Catalina M.D.   On: 07/10/2020 16:50    Pending Labs Unresulted Labs (From admission, onward)          Start     Ordered   07/11/20 0500  Comprehensive metabolic panel  Tomorrow morning,   R        07/10/20 2312   07/11/20 0500  CBC  Tomorrow morning,   R        07/10/20 2312   07/10/20 2145  Urine culture  ONCE - STAT,   STAT        07/10/20 2144   07/10/20 2131  SARS CORONAVIRUS 2 (TAT 6-24 HRS) Nasopharyngeal Nasopharyngeal Swab  (Tier 3 - Symptomatic/asymptomatic)  Once,   STAT       Question Answer Comment  Is this test for diagnosis or screening Screening   Symptomatic for COVID-19 as defined by CDC No   Hospitalized for COVID-19 No   Admitted to ICU for COVID-19 No   Previously tested for COVID-19 Yes   Resident in a congregate (group) care setting No   Employed in healthcare setting No   Pregnant No   Has patient completed COVID vaccination(s) (2 doses of Pfizer/Moderna 1 dose of [employer]) Unknown      07/10/20 2130          Vitals/Pain Today's Vitals   07/10/20 2045 07/10/20 2100 07/10/20 2150 07/10/20 2215  BP: (!) 142/73 138/61  135/73  Pulse: 85 82  82  Resp:  14  (!) 23  Temp:      SpO2: 92% 94%  96%  Weight:      Height:      PainSc:   8      Isolation Precautions No active isolations  Medications Medications  acetaminophen (TYLENOL) tablet 500 mg (has no administration in time range)  docusate sodium (COLACE) capsule 100 mg (has no administration in time range)  latanoprost (XALATAN) 0.005 % ophthalmic solution 1 drop (has no administration in time range)  vitamin B-12 (CYANOCOBALAMIN) tablet 1,000 mcg (has no  administration in time range)  metoCLOPramide (REGLAN) tablet 10 mg (has no administration in time range)  metoprolol succinate (TOPROL-XL) 24 hr tablet 12.5 mg (has no administration in time range)  isosorbide mononitrate (IMDUR) 24 hr tablet 15 mg (has no administration in time range)  clopidogrel (PLAVIX) tablet 75 mg (has no administration in time range)  gabapentin (NEURONTIN) capsule 300 mg (has no administration in time range)  albuterol (VENTOLIN HFA) 108 (90 Base) MCG/ACT inhaler 2 puff (has no administration in time range)  ondansetron (ZOFRAN-ODT) disintegrating tablet 4 mg (has no administration in time range)  insulin glargine (LANTUS) Solostar Pen 10 Units (has no administration in time range)  pantoprazole (PROTONIX) EC tablet 40 mg (has no administration in time range)  apixaban (ELIQUIS) tablet 5 mg (has no administration in time range)  atorvastatin (LIPITOR) tablet 40 mg (has no administration in time range)  glipiZIDE (GLUCOTROL) tablet 5 mg (has no administration in time range)  nitroGLYCERIN (NITROSTAT) SL tablet 0.4 mg (has  no administration in time range)  DULoxetine (CYMBALTA) DR capsule 20 mg (has no administration in time range)  dapagliflozin propanediol (FARXIGA) tablet 5 mg (has no administration in time range)  furosemide (LASIX) tablet 20 mg (has no administration in time range)  0.9 % NaCl with KCl 40 mEq / L  infusion (has no administration in time range)  ketorolac (TORADOL) 15 MG/ML injection 15 mg (has no administration in time range)  ondansetron (ZOFRAN) tablet 4 mg (has no administration in time range)    Or  ondansetron (ZOFRAN) injection 4 mg (has no administration in time range)  insulin aspart (novoLOG) injection 0-15 Units (has no administration in time range)  insulin aspart (novoLOG) injection 0-5 Units (has no administration in time range)  aztreonam (AZACTAM) 1 g in sodium chloride 0.9 % 100 mL IVPB (has no administration in time range)   sodium chloride 0.9 % bolus 1,000 mL (0 mLs Intravenous Stopped 07/10/20 1600)  morphine 4 MG/ML injection 4 mg (4 mg Intravenous Given 07/10/20 1538)  ondansetron (ZOFRAN) injection 4 mg (4 mg Intravenous Given 07/10/20 1536)  cefTRIAXone (ROCEPHIN) 1 g in sodium chloride 0.9 % 100 mL IVPB (0 g Intravenous Stopped 07/10/20 2026)  morphine 4 MG/ML injection 4 mg (4 mg Intravenous Given 07/10/20 1931)  ondansetron (ZOFRAN) injection 4 mg (4 mg Intravenous Given 07/10/20 1929)    Mobility walks
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
## 2020-07-10 NOTE — Progress Notes (Signed)
Pharmacy Antibiotic Note  Maria Fox is a 75 y.o. female admitted on 07/10/2020 with abdominal pain  Pharmacy has been consulted to dose aztreonam for UTI  Plan: Aztreonam 1gm IV q8h Follow renal function, cultures and clinical course  Height: 5\' 2"  (157.5 cm) Weight: 63.8 kg (140 lb 10.5 oz) IBW/kg (Calculated) : 50.1  Temp (24hrs), Avg:97.7 F (36.5 C), Min:97.7 F (36.5 C), Max:97.7 F (36.5 C)  Recent Labs  Lab 07/04/20 0208 07/10/20 1545  WBC  --  7.2  CREATININE 2.47* 1.03*    Estimated Creatinine Clearance: 40.1 mL/min (A) (by C-G formula based on SCr of 1.03 mg/dL (H)).    Allergies  Allergen Reactions  . Bee Venom     Throat swelling  . Mushroom Extract Complex Anaphylaxis  . Penicillins Anaphylaxis, Hives and Swelling    Has patient had a PCN reaction causing immediate rash, facial/tongue/throat swelling, SOB or lightheadedness with hypotension: Yes Has patient had a PCN reaction causing severe rash involving mucus membranes or skin necrosis: Yes Has patient had a PCN reaction that required hospitalization Yes Has patient had a PCN reaction occurring within the last 10 years: No If all of the above answers are "NO", then may proceed with Cephalosporin use.   . Codeine Nausea And Vomiting  . Sulfa Antibiotics Nausea And Vomiting  . Iohexol   . Erythromycin Base Rash     Thank you for allowing pharmacy to be a part of this patient's care.  Dolly Rias RPh 07/10/2020, 10:51 PM

## 2020-07-10 NOTE — Telephone Encounter (Signed)
Pt currently being evaluated in the ER.

## 2020-07-11 ENCOUNTER — Encounter (HOSPITAL_COMMUNITY): Payer: Self-pay | Admitting: Internal Medicine

## 2020-07-11 DIAGNOSIS — N3001 Acute cystitis with hematuria: Secondary | ICD-10-CM | POA: Diagnosis not present

## 2020-07-11 LAB — CBC
HCT: 33.3 % — ABNORMAL LOW (ref 36.0–46.0)
Hemoglobin: 10.9 g/dL — ABNORMAL LOW (ref 12.0–15.0)
MCH: 29.9 pg (ref 26.0–34.0)
MCHC: 32.7 g/dL (ref 30.0–36.0)
MCV: 91.5 fL (ref 80.0–100.0)
Platelets: 278 10*3/uL (ref 150–400)
RBC: 3.64 MIL/uL — ABNORMAL LOW (ref 3.87–5.11)
RDW: 13.3 % (ref 11.5–15.5)
WBC: 6.1 10*3/uL (ref 4.0–10.5)
nRBC: 0 % (ref 0.0–0.2)

## 2020-07-11 LAB — COMPREHENSIVE METABOLIC PANEL
ALT: 14 U/L (ref 0–44)
AST: 19 U/L (ref 15–41)
Albumin: 2.6 g/dL — ABNORMAL LOW (ref 3.5–5.0)
Alkaline Phosphatase: 61 U/L (ref 38–126)
Anion gap: 6 (ref 5–15)
BUN: 12 mg/dL (ref 8–23)
CO2: 22 mmol/L (ref 22–32)
Calcium: 8.4 mg/dL — ABNORMAL LOW (ref 8.9–10.3)
Chloride: 113 mmol/L — ABNORMAL HIGH (ref 98–111)
Creatinine, Ser: 0.97 mg/dL (ref 0.44–1.00)
GFR, Estimated: >60 mL/min (ref >60)
Glucose, Bld: 205 mg/dL — ABNORMAL HIGH (ref 70–99)
Potassium: 4.3 mmol/L (ref 3.5–5.1)
Sodium: 141 mmol/L (ref 135–145)
Total Bilirubin: 0.1 mg/dL — ABNORMAL LOW (ref 0.3–1.2)
Total Protein: 5.7 g/dL — ABNORMAL LOW (ref 6.5–8.1)

## 2020-07-11 LAB — GLUCOSE, CAPILLARY
Glucose-Capillary: 200 mg/dL — ABNORMAL HIGH (ref 70–99)
Glucose-Capillary: 94 mg/dL (ref 70–99)
Glucose-Capillary: 127 mg/dL — ABNORMAL HIGH (ref 70–99)

## 2020-07-11 LAB — CBG MONITORING, ED: Glucose-Capillary: 214 mg/dL — ABNORMAL HIGH (ref 70–99)

## 2020-07-11 LAB — SARS CORONAVIRUS 2 (TAT 6-24 HRS): SARS Coronavirus 2: NEGATIVE

## 2020-07-11 MED ORDER — SODIUM CHLORIDE 0.9 % IV SOLN
1.0000 g | INTRAVENOUS | Status: DC
  Administered 2020-07-11 – 2020-07-13 (×3): 1 g via INTRAVENOUS
  Filled 2020-07-11 (×3): qty 1

## 2020-07-11 NOTE — ED Notes (Signed)
Pt given orange juice. Tolerating PO fluids and food well despite nausea. Abdominal pain 6/10. Headache 8/10

## 2020-07-11 NOTE — Progress Notes (Signed)
Pharmacy Medication Note:  SGLT2 inhibitor (dapagliflozin) contraindications:  Urinary tract infection - culture pending  Acute renal failure   eGFR less than 45 TY/VDP/3.22 m2   Metabolic acidosis (including Diabetic ketoacidosis))  NPO status   Dehydration   Volume depletion  Peggyann Juba, PharmD, BCPS Pharmacy: [phone] 07/11/2020 8:24 AM

## 2020-07-11 NOTE — Progress Notes (Signed)
PROGRESS NOTE    GLENNICE MARCOS  OVF:[id number] DOB: 06-23-1945 DOA: 07/10/2020 PCP: [name], MD   Brief Narrative:  This 75 years old female with PMH significant for CAD, history of CVA, DVT, history of A. fib, autoimmune deficiency syndrome, history of recurrent UTIs, diastolic CHF, diabetes mellitus, essential hypertension, hyperlipidemia presenting in the ED with burning urination associated with nausea.  Patient also reports cramping pain in both flanks and in suprapubic region.  UA is consistent with UTI . CT Abd/ pelvis did not show any evidence of pyelonephritis.  Patient was admitted for UTI and started on IV antibiotics.  Assessment & Plan:   Principal Problem:   Acute cystitis Active Problems:   Hypertension, essential   Hypokalemia   Nausea without vomiting   Chronic diastolic CHF (congestive heart failure) ([name])   Stage 3 chronic kidney disease due to type 2 diabetes mellitus ([name])   GERD (gastroesophageal reflux disease)   CAD (coronary artery disease)   Acute cystitis: Patient presented with urinary burning associated with nausea. CT abdomen does not show any evidence of acute pyelonephritis. Patient continued to remain symptomatic. Patient was started on  Azactam, given penicillin allergy. Patient reports ceftriaxone in the past so it was switched to ceftriaxone. Follow-up urine culture.  Persistent nausea:  It could be secondary to UTI could also be diabetic gastroparesis. Continue IV Zofran.  Hypokalemia: Replaced and improved.  Essential hypertension: Continue home blood pressure medications.  Atrial fibrillation :  Heart rate controlled,  continue Eliquis for anticoagulation.  CKD stage IIIa :  Avoid Nephrotoxic medications.  Serum creatinine at baseline.  Coronary artery disease : stable continue home medications  Diabetes  Mellitus :  Continue glipizide and sliding scale.  DVT prophylaxis:  Code Status: Full code. Family Communication:  No family at bed side. Disposition Plan:   Status is: Inpatient  Remains inpatient appropriate because:Inpatient level of care appropriate due to severity of illness   Dispo: The patient is from: Home              Anticipated d/c is to: Home              Patient currently is not medically stable to d/c.   Difficult to place patient No   Consultants:   None  Procedures:  Antimicrobials:   Anti-infectives (From admission, onward)   Start     Dose/Rate Route Frequency Ordered Stop   07/11/20 1800  cefTRIAXone (ROCEPHIN) 1 g in sodium chloride 0.9 % 100 mL IVPB        1 g 200 mL/hr over 30 Minutes Intravenous Every 24 hours 07/11/20 0829     07/11/20 0000  aztreonam (AZACTAM) 1 g in sodium chloride 0.9 % 100 mL IVPB  Status:  Discontinued        1 g 200 mL/hr over 30 Minutes Intravenous Every 8 hours 07/10/20 2252 07/11/20 0829   07/10/20 1930  cefTRIAXone (ROCEPHIN) 1 g in sodium chloride 0.9 % 100 mL IVPB        1 g 200 mL/hr over 30 Minutes Intravenous  Once 07/10/20 1923 07/10/20 2026     Subjective: Patient was seen and examined at bedside.  Overnight events noted.   Patient reports having burning urination,  denies any fever or blood in the urine.  Objective: Vitals:   07/11/20 0743 07/11/20 0805 07/11/20 0943 07/11/20 1342  BP: 125/67 135/63 134/68 120/65  Pulse: 76 86 81 73  Resp: 15 20  18   Temp:  97.9 F (36.6 C) 97.8 F (36.6 C) 98.1 F (36.7 C) 97.7 F (36.5 C)  TempSrc: Oral Oral Oral   SpO2: 95% 99%  94%  Weight:   66.6 kg   Height:   5\' 2"  (1.575 m)     Intake/Output Summary (Last 24 hours) at 07/11/2020 1617 Last data filed at 07/11/2020 1351 Gross per 24 hour  Intake 100 ml  Output 800 ml  Net -700 ml   Filed Weights   07/10/20 1434 07/11/20 0943  Weight: 63.8 kg 66.6 kg    Examination:  General exam: Appears calm and comfortable, not in any acute distress. Respiratory system: Clear to auscultation. Respiratory effort  normal. Cardiovascular system: S1 & S2 heard, RRR. No JVD, murmurs, rubs, gallops or clicks. No pedal edema. Gastrointestinal system: Abdomen is nondistended, soft and nontender. No organomegaly or masses felt.  Normal bowel sounds heard. Central nervous system: Alert and oriented. No focal neurological deficits. Extremities: Symmetric 5 x 5 power.  No edema, no cyanosis, no clubbing. Skin: No rashes, lesions or ulcers Psychiatry: Judgement and insight appear normal. Mood & affect appropriate.     Data Reviewed: I have personally reviewed following labs and imaging studies  CBC: Recent Labs  Lab 07/10/20 1545 07/11/20 0612  WBC 7.2 6.1  NEUTROABS 4.7  --   HGB 13.5 10.9*  HCT 40.8 33.3*  MCV 90.1 91.5  PLT 340 202   Basic Metabolic Panel: Recent Labs  Lab 07/10/20 1545 07/11/20 0612  NA 136 141  K 3.3* 4.3  CL 104 113*  CO2 22 22  GLUCOSE 239* 205*  BUN 13 12  CREATININE 1.03* 0.97  CALCIUM 9.2 8.4*   GFR: Estimated Creatinine Clearance: 43.5 mL/min (by C-G formula based on SCr of 0.97 mg/dL). Liver Function Tests: Recent Labs  Lab 07/10/20 1545 07/11/20 0612  AST 27 19  ALT 17 14  ALKPHOS 84 61  BILITOT 0.6 0.1*  PROT 7.6 5.7*  ALBUMIN 3.5 2.6*   Recent Labs  Lab 07/10/20 1545  LIPASE 32   No results for input(s): AMMONIA in the last 168 hours. Coagulation Profile: No results for input(s): INR, PROTIME in the last 168 hours. Cardiac Enzymes: No results for input(s): CKTOTAL, CKMB, CKMBINDEX, TROPONINI in the last 168 hours. BNP (last 3 results) No results for input(s): PROBNP in the last 8760 hours. HbA1C: No results for input(s): HGBA1C in the last 72 hours. CBG: Recent Labs  Lab 07/10/20 1526 07/10/20 1819 07/10/20 2340 07/11/20 0740 07/11/20 1204  GLUCAP 231* 172* 166* 214* 200*   Lipid Profile: No results for input(s): CHOL, HDL, LDLCALC, TRIG, CHOLHDL, LDLDIRECT in the last 72 hours. Thyroid Function Tests: No results for input(s):  TSH, T4TOTAL, FREET4, T3FREE, THYROIDAB in the last 72 hours. Anemia Panel: No results for input(s): VITAMINB12, FOLATE, FERRITIN, TIBC, IRON, RETICCTPCT in the last 72 hours. Sepsis Labs: No results for input(s): PROCALCITON, LATICACIDVEN in the last 168 hours.  Recent Results (from the past 240 hour(s))  SARS CORONAVIRUS 2 (TAT 6-24 HRS) Nasopharyngeal Nasopharyngeal Swab     Status: None   Collection Time: 07/10/20 10:00 PM   Specimen: Nasopharyngeal Swab  Result Value Ref Range Status   SARS Coronavirus 2 NEGATIVE NEGATIVE Final    Comment: (NOTE) SARS-CoV-2 target nucleic acids are NOT DETECTED.  The SARS-CoV-2 RNA is generally detectable in upper and lower respiratory specimens during the acute phase of infection. Negative results do not preclude SARS-CoV-2 infection, do not rule out co-infections with other  pathogens, and should not be used as the sole basis for treatment or other patient management decisions. Negative results must be combined with clinical observations, patient history, and epidemiological information. The expected result is Negative.  Fact Sheet for Patients: SugarRoll.be  Fact Sheet for Healthcare Providers: https://www.woods-mathews.com/  This test is not yet approved or cleared by the [place] FDA and  has been authorized for detection and/or diagnosis of SARS-CoV-2 by FDA under an Emergency Use Authorization (EUA). This EUA will remain  in effect (meaning this test can be used) for the duration of the COVID-19 declaration under Se ction 564(b)(1) of the Act, 21 U.S.C. section 360bbb-3(b)(1), unless the authorization is terminated or revoked sooner.  Performed at [facility], [address], [place], [place] [address]          Radiology Studies: CT ABDOMEN PELVIS WO CONTRAST  Result Date: 07/10/2020 CLINICAL DATA:  Acute abdominal pain EXAM: CT ABDOMEN AND PELVIS WITHOUT CONTRAST  TECHNIQUE: Multidetector CT imaging of the abdomen and pelvis was performed following the standard protocol without IV contrast. COMPARISON:  03/27/2020 FINDINGS: Lower chest: No acute abnormality. Chronic pericardial thickening is noted. Hepatobiliary: No focal liver abnormality is seen. Status post cholecystectomy. No biliary dilatation. Pancreas: Unremarkable. No pancreatic ductal dilatation or surrounding inflammatory changes. Spleen: Multiple calcified granulomas are noted Adrenals/Urinary Tract: Adrenal glands are within normal limits. No renal calculi are noted. No obstructive changes are seen. The bladder is partially distended. Stomach/Bowel: The appendix is within normal limits. No obstructive or inflammatory changes of the colon are seen. Mild diverticular changes noted without diverticulitis. Small bowel and stomach appear within normal limits. Vascular/Lymphatic: Aortic atherosclerosis. No enlarged abdominal or pelvic lymph nodes. Reproductive: Uterus and bilateral adnexa are unremarkable. Other: No abdominal wall hernia or abnormality. No abdominopelvic ascites. Musculoskeletal: No acute or significant osseous findings. IMPRESSION: Diverticulosis without diverticulitis. Changes of prior granulomatous disease. Chronic thickening of the pericardium. This is stable dating back to 2016 and likely related to prior cardiac surgery. Electronically Signed   By: Inez Catalina M.D.   On: 07/10/2020 16:50   Scheduled Meds: . apixaban  5 mg Oral BID  . atorvastatin  40 mg Oral QHS  . clopidogrel  75 mg Oral Daily  . DULoxetine  20 mg Oral Daily  . furosemide  20 mg Oral Daily  . gabapentin  300 mg Oral BID  . glipiZIDE  5 mg Oral BID AC  . insulin aspart  0-15 Units Subcutaneous TID WC  . insulin aspart  0-5 Units Subcutaneous QHS  . insulin glargine  10 Units Subcutaneous Daily  . isosorbide mononitrate  15 mg Oral Daily  . latanoprost  1 drop Both Eyes QHS  . metoprolol succinate  12.5 mg Oral  Daily  . pantoprazole  40 mg Oral BID  . vitamin B-12  1,000 mcg Oral Daily   Continuous Infusions: . 0.9 % NaCl with KCl 40 mEq / L 125 mL/hr at 07/11/20 0846  . cefTRIAXone (ROCEPHIN)  IV       LOS: 1 day    Time spent: 35 mins    Reema Chick, MD [facility]   If 7PM-7AM, please contact night-coverage

## 2020-07-12 DIAGNOSIS — N3001 Acute cystitis with hematuria: Secondary | ICD-10-CM | POA: Diagnosis not present

## 2020-07-12 LAB — BASIC METABOLIC PANEL
Anion gap: 5 (ref 5–15)
BUN: 9 mg/dL (ref 8–23)
CO2: 22 mmol/L (ref 22–32)
Calcium: 8.7 mg/dL — ABNORMAL LOW (ref 8.9–10.3)
Chloride: 113 mmol/L — ABNORMAL HIGH (ref 98–111)
Creatinine, Ser: 1.16 mg/dL — ABNORMAL HIGH (ref 0.44–1.00)
GFR, Estimated: 49 mL/min — ABNORMAL LOW (ref >60)
Glucose, Bld: 100 mg/dL — ABNORMAL HIGH (ref 70–99)
Potassium: 5 mmol/L (ref 3.5–5.1)
Sodium: 140 mmol/L (ref 135–145)

## 2020-07-12 LAB — GLUCOSE, CAPILLARY
Glucose-Capillary: 112 mg/dL — ABNORMAL HIGH (ref 70–99)
Glucose-Capillary: 85 mg/dL (ref 70–99)
Glucose-Capillary: 131 mg/dL — ABNORMAL HIGH (ref 70–99)
Glucose-Capillary: 95 mg/dL (ref 70–99)

## 2020-07-12 MED ORDER — SODIUM CHLORIDE 0.9 % IV SOLN
INTRAVENOUS | Status: DC
  Administered 2020-07-17: 1000 mL via INTRAVENOUS

## 2020-07-12 MED ORDER — ALUM & MAG HYDROXIDE-SIMETH 200-200-20 MG/5ML PO SUSP
30.0000 mL | Freq: Four times a day (QID) | ORAL | Status: AC | PRN
Start: 2020-07-12 — End: 2020-07-12
  Administered 2020-07-12: 30 mL via ORAL
  Filled 2020-07-12: qty 30

## 2020-07-12 MED ORDER — PANTOPRAZOLE SODIUM 40 MG IV SOLR
40.0000 mg | Freq: Two times a day (BID) | INTRAVENOUS | Status: DC
  Administered 2020-07-12 – 2020-07-13 (×3): 40 mg via INTRAVENOUS
  Filled 2020-07-12 (×3): qty 40

## 2020-07-12 NOTE — Plan of Care (Signed)
Pt c/o headache, first Tylenol was administered as RX; then Toradol was offered and accepted by patient; Maalox given for upset stomach; pt now sleeping with call light within reach and bed at lower position for safety. No s/s of acute distress reported or observed.

## 2020-07-12 NOTE — Progress Notes (Signed)
PROGRESS NOTE    Maria Fox  NWG:[id number] DOB: 1945/08/05 DOA: 07/10/2020 PCP: [name], MD   Brief Narrative:  This 75 years old female with PMH significant for CAD, history of CVA, DVT, history of A. fib, autoimmune deficiency syndrome, history of recurrent UTIs, diastolic CHF, diabetes mellitus, essential hypertension, hyperlipidemia presenting in the ED with burning urination associated with nausea.  Patient also reports cramping pain in both flanks and in suprapubic region.  UA is consistent with UTI . CT Abd/ pelvis did not show any evidence of pyelonephritis.  Patient was admitted for UTI and started on IV antibiotics.  Assessment & Plan:   Principal Problem:   Acute cystitis Active Problems:   Hypertension, essential   Hypokalemia   Nausea without vomiting   Chronic diastolic CHF (congestive heart failure) ([name])   Stage 3 chronic kidney disease due to type 2 diabetes mellitus ([name])   GERD (gastroesophageal reflux disease)   CAD (coronary artery disease)   Acute cystitis: Patient presented with urinary burning associated with nausea. CT abdomen does not show any evidence of acute pyelonephritis. Patient continued to remain symptomatic. Patient was started on  Azactam, given penicillin allergy. Patient reports getting ceftriaxone in the past so it was switched to ceftriaxone. Urine culture grew Klebsiella pneumonia sensitivity pending.  Persistent nausea:  It could be secondary to UTI ,could also be diabetic gastroparesis. Continue IV Zofran and Reglan as needed  Hypokalemia: Replaced and improved.  Essential hypertension: Continue home blood pressure medications.  Atrial fibrillation :  Heart rate controlled,  continue Eliquis for anticoagulation.  CKD stage IIIa :  Avoid Nephrotoxic medications.  Serum creatinine at baseline.  Coronary artery disease : stable continue home medications  Diabetes  Mellitus :  Continue glipizide and sliding  scale.  DVT prophylaxis:  Code Status: Full code. Family Communication: No family at bed side. Disposition Plan:   Status is: Inpatient  Remains inpatient appropriate because:Inpatient level of care appropriate due to severity of illness   Dispo: The patient is from: Home              Anticipated d/c is to: Home              Patient currently is not medically stable to d/c.   Difficult to place patient No   Consultants:   None  Procedures:  Antimicrobials:   Anti-infectives (From admission, onward)   Start     Dose/Rate Route Frequency Ordered Stop   07/11/20 1800  cefTRIAXone (ROCEPHIN) 1 g in sodium chloride 0.9 % 100 mL IVPB        1 g 200 mL/hr over 30 Minutes Intravenous Every 24 hours 07/11/20 0829     07/11/20 0000  aztreonam (AZACTAM) 1 g in sodium chloride 0.9 % 100 mL IVPB  Status:  Discontinued        1 g 200 mL/hr over 30 Minutes Intravenous Every 8 hours 07/10/20 2252 07/11/20 0829   07/10/20 1930  cefTRIAXone (ROCEPHIN) 1 g in sodium chloride 0.9 % 100 mL IVPB        1 g 200 mL/hr over 30 Minutes Intravenous  Once 07/10/20 1923 07/10/20 2026     Subjective: Patient was seen and examined at bedside.  Overnight events noted.   Patient reports having loose watery stools associated with nausea.  Also reported epigastric burning.  Objective: Vitals:   07/11/20 2107 07/12/20 0508 07/12/20 1012 07/12/20 1335  BP: 114/64 127/77 128/70 (!) 144/74  Pulse: 73 80  85 83  Resp: 16 16  15   Temp: 97.7 F (36.5 C) 98.1 F (36.7 C)  98.2 F (36.8 C)  TempSrc: Oral Oral  Oral  SpO2: 94% 95%  94%  Weight:      Height:        Intake/Output Summary (Last 24 hours) at 07/12/2020 1613 Last data filed at 07/12/2020 0303 Gross per 24 hour  Intake 1500 ml  Output --  Net 1500 ml   Filed Weights   07/10/20 1434 07/11/20 0943  Weight: 63.8 kg 66.6 kg    Examination:  General exam: Appears calm and comfortable, not in any acute distress. Respiratory system: Clear  to auscultation. Respiratory effort normal. Cardiovascular system: S1 & S2 heard, RRR. No JVD, murmurs, rubs, gallops or clicks. No pedal edema. Gastrointestinal system: Abdomen is nondistended, soft and nontender. No organomegaly or masses felt.  Normal bowel sounds heard. Central nervous system: Alert and oriented. No focal neurological deficits. Extremities: Symmetric 5 x 5 power.  No edema, no cyanosis, no clubbing. Skin: No rashes, lesions or ulcers Psychiatry: Judgement and insight appear normal. Mood & affect appropriate.     Data Reviewed: I have personally reviewed following labs and imaging studies  CBC: Recent Labs  Lab 07/10/20 1545 07/11/20 0612  WBC 7.2 6.1  NEUTROABS 4.7  --   HGB 13.5 10.9*  HCT 40.8 33.3*  MCV 90.1 91.5  PLT 340 0000000   Basic Metabolic Panel: Recent Labs  Lab 07/10/20 1545 07/11/20 0612 07/12/20 0524  NA 136 141 140  K 3.3* 4.3 5.0  CL 104 113* 113*  CO2 22 22 22   GLUCOSE 239* 205* 100*  BUN 13 12 9   CREATININE 1.03* 0.97 1.16*  CALCIUM 9.2 8.4* 8.7*   GFR: Estimated Creatinine Clearance: 36.4 mL/min (A) (by C-G formula based on SCr of 1.16 mg/dL (H)). Liver Function Tests: Recent Labs  Lab 07/10/20 1545 07/11/20 0612  AST 27 19  ALT 17 14  ALKPHOS 84 61  BILITOT 0.6 0.1*  PROT 7.6 5.7*  ALBUMIN 3.5 2.6*   Recent Labs  Lab 07/10/20 1545  LIPASE 32   No results for input(s): AMMONIA in the last 168 hours. Coagulation Profile: No results for input(s): INR, PROTIME in the last 168 hours. Cardiac Enzymes: No results for input(s): CKTOTAL, CKMB, CKMBINDEX, TROPONINI in the last 168 hours. BNP (last 3 results) No results for input(s): PROBNP in the last 8760 hours. HbA1C: No results for input(s): HGBA1C in the last 72 hours. CBG: Recent Labs  Lab 07/11/20 1204 07/11/20 1738 07/11/20 2109 07/12/20 0743 07/12/20 1200  GLUCAP 200* 94 127* 112* 85   Lipid Profile: No results for input(s): CHOL, HDL, LDLCALC, TRIG,  CHOLHDL, LDLDIRECT in the last 72 hours. Thyroid Function Tests: No results for input(s): TSH, T4TOTAL, FREET4, T3FREE, THYROIDAB in the last 72 hours. Anemia Panel: No results for input(s): VITAMINB12, FOLATE, FERRITIN, TIBC, IRON, RETICCTPCT in the last 72 hours. Sepsis Labs: No results for input(s): PROCALCITON, LATICACIDVEN in the last 168 hours.  Recent Results (from the past 240 hour(s))  Urine culture     Status: Abnormal (Preliminary result)   Collection Time: 07/10/20  9:45 PM   Specimen: Urine, Clean Catch  Result Value Ref Range Status   Specimen Description   Final    URINE, CLEAN CATCH Performed at [facility], [place] [address] [place], [place] [address]    Special Requests   Final    NONE Performed at [facility]  [facility], [place] [address], [place], [place] [address]    Culture (A)  Final    >=100,000 COLONIES/mL KLEBSIELLA PNEUMONIAE SUSCEPTIBILITIES TO FOLLOW Performed at [facility], [place] [address] [place], [place] [address]    Report Status PENDING  Incomplete  SARS CORONAVIRUS 2 (TAT 6-24 HRS) Nasopharyngeal Nasopharyngeal Swab     Status: None   Collection Time: 07/10/20 10:00 PM   Specimen: Nasopharyngeal Swab  Result Value Ref Range Status   SARS Coronavirus 2 NEGATIVE NEGATIVE Final    Comment: (NOTE) SARS-CoV-2 target nucleic acids are NOT DETECTED.  The SARS-CoV-2 RNA is generally detectable in upper and lower respiratory specimens during the acute phase of infection. Negative results do not preclude SARS-CoV-2 infection, do not rule out co-infections with other pathogens, and should not be used as the sole basis for treatment or other patient management decisions. Negative results must be combined with clinical observations, patient history, and epidemiological information. The expected result is Negative.  Fact Sheet for Patients: SugarRoll.be  Fact Sheet for Healthcare  Providers: https://www.woods-mathews.com/  This test is not yet approved or cleared by the [place] FDA and  has been authorized for detection and/or diagnosis of SARS-CoV-2 by FDA under an Emergency Use Authorization (EUA). This EUA will remain  in effect (meaning this test can be used) for the duration of the COVID-19 declaration under Se ction 564(b)(1) of the Act, 21 U.S.C. section 360bbb-3(b)(1), unless the authorization is terminated or revoked sooner.  Performed at [facility], [place] [address], [place], [place] [address]          Radiology Studies: CT ABDOMEN PELVIS WO CONTRAST  Result Date: 07/10/2020 CLINICAL DATA:  Acute abdominal pain EXAM: CT ABDOMEN AND PELVIS WITHOUT CONTRAST TECHNIQUE: Multidetector CT imaging of the abdomen and pelvis was performed following the standard protocol without IV contrast. COMPARISON:  03/27/2020 FINDINGS: Lower chest: No acute abnormality. Chronic pericardial thickening is noted. Hepatobiliary: No focal liver abnormality is seen. Status post cholecystectomy. No biliary dilatation. Pancreas: Unremarkable. No pancreatic ductal dilatation or surrounding inflammatory changes. Spleen: Multiple calcified granulomas are noted Adrenals/Urinary Tract: Adrenal glands are within normal limits. No renal calculi are noted. No obstructive changes are seen. The bladder is partially distended. Stomach/Bowel: The appendix is within normal limits. No obstructive or inflammatory changes of the colon are seen. Mild diverticular changes noted without diverticulitis. Small bowel and stomach appear within normal limits. Vascular/Lymphatic: Aortic atherosclerosis. No enlarged abdominal or pelvic lymph nodes. Reproductive: Uterus and bilateral adnexa are unremarkable. Other: No abdominal wall hernia or abnormality. No abdominopelvic ascites. Musculoskeletal: No acute or significant osseous findings. IMPRESSION: Diverticulosis without diverticulitis.  Changes of prior granulomatous disease. Chronic thickening of the pericardium. This is stable dating back to 2016 and likely related to prior cardiac surgery. Electronically Signed   By: Inez Catalina M.D.   On: 07/10/2020 16:50   Scheduled Meds: . apixaban  5 mg Oral BID  . atorvastatin  40 mg Oral QHS  . clopidogrel  75 mg Oral Daily  . DULoxetine  20 mg Oral Daily  . furosemide  20 mg Oral Daily  . gabapentin  300 mg Oral BID  . glipiZIDE  5 mg Oral BID AC  . insulin aspart  0-15 Units Subcutaneous TID WC  . insulin aspart  0-5 Units Subcutaneous QHS  . insulin glargine  10 Units Subcutaneous Daily  . isosorbide mononitrate  15 mg Oral Daily  . latanoprost  1 drop Both Eyes QHS  .  metoprolol succinate  12.5 mg Oral Daily  . pantoprazole (PROTONIX) IV  40 mg Intravenous Q12H  . vitamin B-12  1,000 mcg Oral Daily   Continuous Infusions: . 0.9 % NaCl with KCl 40 mEq / L 125 mL/hr at 07/12/20 1224  . cefTRIAXone (ROCEPHIN)  IV 1 g (07/11/20 1803)     LOS: 2 days    Time spent: 25 mins    Zaeem Kandel, MD [facility]   If 7PM-7AM, please contact night-coverage

## 2020-07-13 DIAGNOSIS — N3001 Acute cystitis with hematuria: Secondary | ICD-10-CM | POA: Diagnosis not present

## 2020-07-13 LAB — CBC
HCT: 34.2 % — ABNORMAL LOW (ref 36.0–46.0)
Hemoglobin: 11.2 g/dL — ABNORMAL LOW (ref 12.0–15.0)
MCH: 30.3 pg (ref 26.0–34.0)
MCHC: 32.7 g/dL (ref 30.0–36.0)
MCV: 92.4 fL (ref 80.0–100.0)
Platelets: 255 10*3/uL (ref 150–400)
RBC: 3.7 MIL/uL — ABNORMAL LOW (ref 3.87–5.11)
RDW: 13.4 % (ref 11.5–15.5)
WBC: 6.6 10*3/uL (ref 4.0–10.5)
nRBC: 0 % (ref 0.0–0.2)

## 2020-07-13 LAB — URINE CULTURE: Culture: >=100000 — AB

## 2020-07-13 LAB — BASIC METABOLIC PANEL
Anion gap: 8 (ref 5–15)
BUN: 5 mg/dL — ABNORMAL LOW (ref 8–23)
CO2: 21 mmol/L — ABNORMAL LOW (ref 22–32)
Calcium: 8.5 mg/dL — ABNORMAL LOW (ref 8.9–10.3)
Chloride: 108 mmol/L (ref 98–111)
Creatinine, Ser: 1.01 mg/dL — ABNORMAL HIGH (ref 0.44–1.00)
GFR, Estimated: 57 mL/min — ABNORMAL LOW (ref >60)
Glucose, Bld: 198 mg/dL — ABNORMAL HIGH (ref 70–99)
Potassium: 5.3 mmol/L — ABNORMAL HIGH (ref 3.5–5.1)
Sodium: 137 mmol/L (ref 135–145)

## 2020-07-13 LAB — HEMOGLOBIN AND HEMATOCRIT, BLOOD
HCT: 34.5 % — ABNORMAL LOW (ref 36.0–46.0)
Hemoglobin: 11.2 g/dL — ABNORMAL LOW (ref 12.0–15.0)

## 2020-07-13 LAB — PHOSPHORUS: Phosphorus: 3 mg/dL (ref 2.5–4.6)

## 2020-07-13 LAB — GLUCOSE, CAPILLARY
Glucose-Capillary: 185 mg/dL — ABNORMAL HIGH (ref 70–99)
Glucose-Capillary: 135 mg/dL — ABNORMAL HIGH (ref 70–99)
Glucose-Capillary: 95 mg/dL (ref 70–99)
Glucose-Capillary: 127 mg/dL — ABNORMAL HIGH (ref 70–99)

## 2020-07-13 LAB — POTASSIUM: Potassium: 4.9 mmol/L (ref 3.5–5.1)

## 2020-07-13 LAB — MAGNESIUM: Magnesium: 1.4 mg/dL — ABNORMAL LOW (ref 1.7–2.4)

## 2020-07-13 MED ORDER — MAGNESIUM SULFATE 2 GM/50ML IV SOLN
2.0000 g | Freq: Once | INTRAVENOUS | Status: AC
  Administered 2020-07-13: 2 g via INTRAVENOUS
  Filled 2020-07-13: qty 50

## 2020-07-13 MED ORDER — HYDROXYZINE HCL 25 MG PO TABS
25.0000 mg | ORAL_TABLET | Freq: Once | ORAL | Status: AC
  Administered 2020-07-14: 25 mg via ORAL
  Filled 2020-07-13: qty 1

## 2020-07-13 MED ORDER — TRAZODONE HCL 50 MG PO TABS
50.0000 mg | ORAL_TABLET | Freq: Every evening | ORAL | Status: DC | PRN
  Administered 2020-07-13 – 2020-07-25 (×9): 50 mg via ORAL
  Filled 2020-07-13 (×9): qty 1

## 2020-07-13 NOTE — Progress Notes (Signed)
PROGRESS NOTE    Maria Fox  TML:[id number] DOB: [birth year] DOA: 07/10/2020 PCP: [name], MD   Brief Narrative:  This 75 years old female with PMH significant for CAD, history of CVA, DVT, history of A. fib, autoimmune deficiency syndrome, history of recurrent UTIs, diastolic CHF, diabetes mellitus, essential hypertension, hyperlipidemia presenting in the ED with burning urination associated with nausea.  Patient also reports cramping pain in both flanks and in suprapubic region.  UA is consistent with UTI . CT Abd/ pelvis did not show any evidence of pyelonephritis.  Patient was admitted for UTI and started on IV antibiotics.  Assessment & Plan:   Principal Problem:   Acute cystitis Active Problems:   Hypertension, essential   Hypokalemia   Nausea without vomiting   Chronic diastolic CHF (congestive heart failure) ([name])   Stage 3 chronic kidney disease due to type 2 diabetes mellitus ([name])   GERD (gastroesophageal reflux disease)   CAD (coronary artery disease)   Acute cystitis: Patient presented with urinary burning associated with nausea. CT abdomen does not show any evidence of acute pyelonephritis. Patient continued to remain symptomatic. Patient was started on  Azactam, given penicillin allergy. Patient reports getting ceftriaxone in the past so it was switched to ceftriaxone. Urine culture grew Klebsiella pneumonia sensitive to cipro and ceftriaxone. Continue ceftriaxone and will switch to oral antibiotics tomorrow.  Persistent nausea:  It could be secondary to UTI ,could also be diabetic gastroparesis. Continue IV Zofran and Reglan as needed.  Hypokalemia: Replaced and improved.  Hypomagnesemia: Replacement in progress  Essential hypertension: Continue home blood pressure medications.  Atrial fibrillation :  Heart rate controlled,  continue Eliquis for anticoagulation.  CKD stage IIIa :  Avoid Nephrotoxic medications.  Serum creatinine at  baseline.  Coronary artery disease : stable,  continue home medications  Diabetes  Mellitus :  Continue glipizide and sliding scale.  DVT prophylaxis:  Code Status: Full code. Family Communication: No family at bed side. Disposition Plan:   Status is: Inpatient  Remains inpatient appropriate because:Inpatient level of care appropriate due to severity of illness   Dispo: The patient is from: Home              Anticipated d/c is to: Home on 5/6              Patient currently is not medically stable to d/c.   Difficult to place patient No   Consultants:   None  Procedures:  Antimicrobials:   Anti-infectives (From admission, onward)   Start     Dose/Rate Route Frequency Ordered Stop   07/11/20 1800  cefTRIAXone (ROCEPHIN) 1 g in sodium chloride 0.9 % 100 mL IVPB        1 g 200 mL/hr over 30 Minutes Intravenous Every 24 hours 07/11/20 0829     07/11/20 0000  aztreonam (AZACTAM) 1 g in sodium chloride 0.9 % 100 mL IVPB  Status:  Discontinued        1 g 200 mL/hr over 30 Minutes Intravenous Every 8 hours 07/10/20 2252 07/11/20 0829   07/10/20 1930  cefTRIAXone (ROCEPHIN) 1 g in sodium chloride 0.9 % 100 mL IVPB        1 g 200 mL/hr over 30 Minutes Intravenous  Once 07/10/20 1923 07/10/20 2026     Subjective: Patient was seen and examined at bedside.  Overnight events noted.   Patient reports diarrhea has improved.  She has only one stool so far since morning. But she continues  to remain nauseous but denies any vomiting.  Objective: Vitals:   07/12/20 1335 07/12/20 1926 07/13/20 0452 07/13/20 1324  BP: (!) 144/74 (!) 153/82 (!) 150/87 132/81  Pulse: 83 78 79 70  Resp: 15 16 16 16   Temp: 98.2 F (36.8 C) 97.7 F (36.5 C) 97.6 F (36.4 C) (!) 97.5 F (36.4 C)  TempSrc: Oral Oral Oral Oral  SpO2: 94% 97% 95% 96%  Weight:      Height:        Intake/Output Summary (Last 24 hours) at 07/13/2020 1451 Last data filed at 07/13/2020 1300 Gross per 24 hour  Intake 1440 ml   Output --  Net 1440 ml   Filed Weights   07/10/20 1434 07/11/20 0943  Weight: 63.8 kg 66.6 kg    Examination:  General exam: Appears calm and comfortable, not in any acute distress. Respiratory system: Clear to auscultation. Respiratory effort normal. Cardiovascular system: S1 & S2 heard, RRR. No JVD, murmurs, rubs, gallops or clicks. No pedal edema. Gastrointestinal system: Abdomen is nondistended, soft and nontender. No organomegaly or masses felt.  Normal bowel sounds heard. Central nervous system: Alert and oriented. No focal neurological deficits. Extremities:  No edema, no cyanosis, no clubbing. Skin: No rashes, lesions or ulcers Psychiatry: Judgement and insight appear normal. Mood & affect appropriate.     Data Reviewed: I have personally reviewed following labs and imaging studies  CBC: Recent Labs  Lab 07/10/20 1545 07/11/20 0612 07/13/20 0546  WBC 7.2 6.1 6.6  NEUTROABS 4.7  --   --   HGB 13.5 10.9* 11.2*  HCT 40.8 33.3* 34.2*  MCV 90.1 91.5 92.4  PLT 340 278 989   Basic Metabolic Panel: Recent Labs  Lab 07/10/20 1545 07/11/20 0612 07/12/20 0524 07/13/20 0546 07/13/20 1054  NA 136 141 140 137  --   K 3.3* 4.3 5.0 5.3* 4.9  CL 104 113* 113* 108  --   CO2 22 22 22  21*  --   GLUCOSE 239* 205* 100* 198*  --   BUN 13 12 9  5*  --   CREATININE 1.03* 0.97 1.16* 1.01*  --   CALCIUM 9.2 8.4* 8.7* 8.5*  --   MG  --   --   --  1.4*  --   PHOS  --   --   --  3.0  --    GFR: Estimated Creatinine Clearance: 41.8 mL/min (A) (by C-G formula based on SCr of 1.01 mg/dL (H)). Liver Function Tests: Recent Labs  Lab 07/10/20 1545 07/11/20 0612  AST 27 19  ALT 17 14  ALKPHOS 84 61  BILITOT 0.6 0.1*  PROT 7.6 5.7*  ALBUMIN 3.5 2.6*   Recent Labs  Lab 07/10/20 1545  LIPASE 32   No results for input(s): AMMONIA in the last 168 hours. Coagulation Profile: No results for input(s): INR, PROTIME in the last 168 hours. Cardiac Enzymes: No results for  input(s): CKTOTAL, CKMB, CKMBINDEX, TROPONINI in the last 168 hours. BNP (last 3 results) No results for input(s): PROBNP in the last 8760 hours. HbA1C: No results for input(s): HGBA1C in the last 72 hours. CBG: Recent Labs  Lab 07/12/20 1200 07/12/20 1640 07/12/20 2137 07/13/20 0745 07/13/20 1207  GLUCAP 85 131* 95 185* 135*   Lipid Profile: No results for input(s): CHOL, HDL, LDLCALC, TRIG, CHOLHDL, LDLDIRECT in the last 72 hours. Thyroid Function Tests: No results for input(s): TSH, T4TOTAL, FREET4, T3FREE, THYROIDAB in the last 72 hours. Anemia Panel: No results for  input(s): VITAMINB12, FOLATE, FERRITIN, TIBC, IRON, RETICCTPCT in the last 72 hours. Sepsis Labs: No results for input(s): PROCALCITON, LATICACIDVEN in the last 168 hours.  Recent Results (from the past 240 hour(s))  Urine culture     Status: Abnormal   Collection Time: 07/10/20  9:45 PM   Specimen: Urine, Clean Catch  Result Value Ref Range Status   Specimen Description   Final    URINE, CLEAN CATCH Performed at [facility], [place] [address], [place], [place] [address]    Special Requests   Final    NONE Performed at [facility], [place] [address] [place], [place] [address]    Culture >=100,000 COLONIES/mL KLEBSIELLA PNEUMONIAE (A)  Final   Report Status 07/13/2020 FINAL  Final   Organism ID, Bacteria KLEBSIELLA PNEUMONIAE (A)  Final      Susceptibility   Klebsiella pneumoniae - MIC*    AMPICILLIN >=32 RESISTANT Resistant     CEFAZOLIN <=4 SENSITIVE Sensitive     CEFEPIME <=0.12 SENSITIVE Sensitive     CEFTRIAXONE <=0.25 SENSITIVE Sensitive     CIPROFLOXACIN <=0.25 SENSITIVE Sensitive     GENTAMICIN <=1 SENSITIVE Sensitive     IMIPENEM <=0.25 SENSITIVE Sensitive     NITROFURANTOIN 64 INTERMEDIATE Intermediate     TRIMETH/SULFA <=20 SENSITIVE Sensitive     AMPICILLIN/SULBACTAM 4 SENSITIVE Sensitive     PIP/TAZO <=4 SENSITIVE Sensitive     * >=100,000 COLONIES/mL  KLEBSIELLA PNEUMONIAE  SARS CORONAVIRUS 2 (TAT 6-24 HRS) Nasopharyngeal Nasopharyngeal Swab     Status: None   Collection Time: 07/10/20 10:00 PM   Specimen: Nasopharyngeal Swab  Result Value Ref Range Status   SARS Coronavirus 2 NEGATIVE NEGATIVE Final    Comment: (NOTE) SARS-CoV-2 target nucleic acids are NOT DETECTED.  The SARS-CoV-2 RNA is generally detectable in upper and lower respiratory specimens during the acute phase of infection. Negative results do not preclude SARS-CoV-2 infection, do not rule out co-infections with other pathogens, and should not be used as the sole basis for treatment or other patient management decisions. Negative results must be combined with clinical observations, patient history, and epidemiological information. The expected result is Negative.  Fact Sheet for Patients: SugarRoll.be  Fact Sheet for Healthcare Providers: https://www.woods-mathews.com/  This test is not yet approved or cleared by the [place] FDA and  has been authorized for detection and/or diagnosis of SARS-CoV-2 by FDA under an Emergency Use Authorization (EUA). This EUA will remain  in effect (meaning this test can be used) for the duration of the COVID-19 declaration under Se ction 564(b)(1) of the Act, 21 U.S.C. section 360bbb-3(b)(1), unless the authorization is terminated or revoked sooner.  Performed at [facility], [place] [address], [place], [place] [address]          Radiology Studies: No results found. Scheduled Meds: . apixaban  5 mg Oral BID  . atorvastatin  40 mg Oral QHS  . clopidogrel  75 mg Oral Daily  . DULoxetine  20 mg Oral Daily  . furosemide  20 mg Oral Daily  . gabapentin  300 mg Oral BID  . glipiZIDE  5 mg Oral BID AC  . insulin aspart  0-15 Units Subcutaneous TID WC  . insulin aspart  0-5 Units Subcutaneous QHS  . insulin glargine  10 Units Subcutaneous Daily  . isosorbide mononitrate  15  mg Oral Daily  . latanoprost  1 drop Both Eyes QHS  . metoprolol succinate  12.5 mg Oral Daily  .  pantoprazole (PROTONIX) IV  40 mg Intravenous Q12H  . vitamin B-12  1,000 mcg Oral Daily   Continuous Infusions: . sodium chloride 125 mL/hr at 07/13/20 0527  . cefTRIAXone (ROCEPHIN)  IV 1 g (07/12/20 1757)     LOS: 3 days    Time spent: 25 mins    Kendyl Bissonnette, MD [facility]   If 7PM-7AM, please contact night-coverage

## 2020-07-14 ENCOUNTER — Other Ambulatory Visit: Payer: Self-pay

## 2020-07-14 DIAGNOSIS — N3001 Acute cystitis with hematuria: Secondary | ICD-10-CM | POA: Diagnosis not present

## 2020-07-14 DIAGNOSIS — R1030 Lower abdominal pain, unspecified: Secondary | ICD-10-CM | POA: Diagnosis not present

## 2020-07-14 DIAGNOSIS — R110 Nausea: Secondary | ICD-10-CM | POA: Diagnosis not present

## 2020-07-14 DIAGNOSIS — R195 Other fecal abnormalities: Secondary | ICD-10-CM

## 2020-07-14 LAB — BASIC METABOLIC PANEL
Anion gap: 5 (ref 5–15)
BUN: 5 mg/dL — ABNORMAL LOW (ref 8–23)
CO2: 22 mmol/L (ref 22–32)
Calcium: 8.5 mg/dL — ABNORMAL LOW (ref 8.9–10.3)
Chloride: 114 mmol/L — ABNORMAL HIGH (ref 98–111)
Creatinine, Ser: 1.01 mg/dL — ABNORMAL HIGH (ref 0.44–1.00)
GFR, Estimated: 57 mL/min — ABNORMAL LOW (ref >60)
Glucose, Bld: 122 mg/dL — ABNORMAL HIGH (ref 70–99)
Potassium: 4.4 mmol/L (ref 3.5–5.1)
Sodium: 141 mmol/L (ref 135–145)

## 2020-07-14 LAB — GLUCOSE, CAPILLARY
Glucose-Capillary: 104 mg/dL — ABNORMAL HIGH (ref 70–99)
Glucose-Capillary: 140 mg/dL — ABNORMAL HIGH (ref 70–99)
Glucose-Capillary: 179 mg/dL — ABNORMAL HIGH (ref 70–99)
Glucose-Capillary: 147 mg/dL — ABNORMAL HIGH (ref 70–99)
Glucose-Capillary: 93 mg/dL (ref 70–99)

## 2020-07-14 LAB — PHOSPHORUS: Phosphorus: 4.6 mg/dL (ref 2.5–4.6)

## 2020-07-14 LAB — MAGNESIUM: Magnesium: 1.7 mg/dL (ref 1.7–2.4)

## 2020-07-14 MED ORDER — CEPHALEXIN 500 MG PO CAPS
500.0000 mg | ORAL_CAPSULE | Freq: Two times a day (BID) | ORAL | Status: DC
  Administered 2020-07-14 – 2020-07-16 (×5): 500 mg via ORAL
  Filled 2020-07-14 (×5): qty 1

## 2020-07-14 MED ORDER — PANTOPRAZOLE SODIUM 40 MG PO TBEC
40.0000 mg | DELAYED_RELEASE_TABLET | Freq: Two times a day (BID) | ORAL | Status: DC
  Administered 2020-07-14 – 2020-07-27 (×27): 40 mg via ORAL
  Filled 2020-07-14 (×27): qty 1

## 2020-07-14 MED ORDER — HYDROXYZINE HCL 25 MG PO TABS
25.0000 mg | ORAL_TABLET | Freq: Once | ORAL | Status: AC
  Administered 2020-07-14: 25 mg via ORAL
  Filled 2020-07-14: qty 1

## 2020-07-14 MED ORDER — DICYCLOMINE HCL 10 MG PO CAPS
10.0000 mg | ORAL_CAPSULE | Freq: Three times a day (TID) | ORAL | Status: DC
  Administered 2020-07-14 – 2020-07-19 (×13): 10 mg via ORAL
  Filled 2020-07-14 (×13): qty 1

## 2020-07-14 MED ORDER — ONDANSETRON HCL 4 MG/2ML IJ SOLN
4.0000 mg | Freq: Four times a day (QID) | INTRAMUSCULAR | Status: DC
  Administered 2020-07-14 – 2020-07-16 (×9): 4 mg via INTRAVENOUS
  Filled 2020-07-14 (×9): qty 2
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
## 2020-07-14 NOTE — Progress Notes (Signed)
PHARMACIST - PHYSICIAN COMMUNICATION DR:   Dwyane Dee CONCERNING: Proton Pump Inhibitor IV to Oral Route Change Policy  The patient is receiving Protonix by the intravenous route. Based on criteria approved by the Pharmacy and [place], the medication is being converted to the equivalent oral dose form.   These criteria include:  -No Active GI bleeding  -Able to tolerate diet of full liquids (or better) or tube feeding  -Able to tolerate other medications by the oral or enteral route   If you have any questions about this conversion, please contact the Pharmacy Department (ext [phone]). Thank you.  Peggyann Juba, PharmD, BCPS 07/14/2020 8:42 AM

## 2020-07-14 NOTE — Progress Notes (Signed)
PROGRESS NOTE    Maria Fox  XLK:[id number] DOB: 1945-08-26 DOA: 07/10/2020 PCP: [name], MD   Brief Narrative:  This 75 years old female with PMH significant for CAD, history of CVA, DVT, history of A. fib, autoimmune deficiency syndrome, history of recurrent UTIs, diastolic CHF, diabetes mellitus, essential hypertension, hyperlipidemia presenting in the ED with burning urination associated with nausea.  Patient also reports cramping pain in both flanks and in suprapubic region. UA is consistent with UTI. CT Abd/ pelvis did not show any evidence of pyelonephritis.  Patient was admitted for UTI and started on IV antibiotics.  Assessment & Plan:   Principal Problem:   Acute cystitis Active Problems:   Hypertension, essential   Hypokalemia   Nausea without vomiting   Chronic diastolic CHF (congestive heart failure) ([name])   Stage 3 chronic kidney disease due to type 2 diabetes mellitus ([name])   GERD (gastroesophageal reflux disease)   CAD (coronary artery disease)   Acute cystitis: Patient presented with urinary burning associated with nausea. CT abdomen does not show any evidence of acute pyelonephritis. Patient continued to remain symptomatic. Patient was started on  Azactam, given penicillin allergy. Patient reports getting ceftriaxone in the past so it was switched to ceftriaxone. Urine culture grew Klebsiella pneumonia sensitive to cipro and ceftriaxone. Continued on ceftriaxone and changed to Keflex today.  Persistent nausea:  It could be secondary to UTI ,could also be diabetic gastroparesis. Continue IV Zofran and Reglan as needed. GI consulted recommended to continue Zofran and Reglan. There is no need for any GI intervention at this point.  Hypokalemia: Replaced and improved.  Hypomagnesemia: Replaced and  improved  Essential hypertension: Continue home blood pressure medications.  Atrial fibrillation :  Heart rate controlled,  continue Eliquis for  anticoagulation.  CKD stage IIIa :  Avoid Nephrotoxic medications.  Serum creatinine at baseline.  Coronary artery disease : stable,  continue home medications  Diabetes  Mellitus :  Continue glipizide and sliding scale.  DVT prophylaxis:  Code Status: Full code. Family Communication: No family at bed side. Disposition Plan:   Status is: Inpatient  Remains inpatient appropriate because:Inpatient level of care appropriate due to severity of illness   Dispo: The patient is from: Home              Anticipated d/c is to: Home on 5/7              Patient currently is not medically stable to d/c.   Difficult to place patient No   Consultants:   None  Procedures:  Antimicrobials:   Anti-infectives (From admission, onward)   Start     Dose/Rate Route Frequency Ordered Stop   07/14/20 1200  cephALEXin (KEFLEX) capsule 500 mg        500 mg Oral Every 12 hours 07/14/20 1003     07/11/20 1800  cefTRIAXone (ROCEPHIN) 1 g in sodium chloride 0.9 % 100 mL IVPB  Status:  Discontinued        1 g 200 mL/hr over 30 Minutes Intravenous Every 24 hours 07/11/20 0829 07/14/20 1002   07/11/20 0000  aztreonam (AZACTAM) 1 g in sodium chloride 0.9 % 100 mL IVPB  Status:  Discontinued        1 g 200 mL/hr over 30 Minutes Intravenous Every 8 hours 07/10/20 2252 07/11/20 0829   07/10/20 1930  cefTRIAXone (ROCEPHIN) 1 g in sodium chloride 0.9 % 100 mL IVPB        1 g  200 mL/hr over 30 Minutes Intravenous  Once 07/10/20 1923 07/10/20 2026     Subjective: Patient was seen and examined at bedside.  Overnight events noted.   Patient reports diarrhea has improved.  She continues to complain about nausea vomiting and abdominal discomfort. She denies any blood in the stools. Objective: Vitals:   07/13/20 2123 07/14/20 0557 07/14/20 1046 07/14/20 1359  BP: (!) 155/92 136/72 137/80 (!) 142/71  Pulse: 79 79 81 69  Resp: 20 16  18   Temp: 97.6 F (36.4 C) 98 F (36.7 C)  (!) 97.5 F (36.4 C)   TempSrc: Oral Oral  Oral  SpO2: 93% 94%  96%  Weight:      Height:        Intake/Output Summary (Last 24 hours) at 07/14/2020 1705 Last data filed at 07/14/2020 1232 Gross per 24 hour  Intake 3503.42 ml  Output 750 ml  Net 2753.42 ml   Filed Weights   07/10/20 1434 07/11/20 0943  Weight: 63.8 kg 66.6 kg    Examination:  General exam: Appears calm and comfortable, not in any acute distress. Respiratory system: Clear to auscultation. Respiratory effort normal. Cardiovascular system: S1 & S2 heard, RRR. No JVD, murmurs, rubs, gallops or clicks. No pedal edema. Gastrointestinal system: Abdomen is nondistended, soft but mildly tender no organomegaly or masses felt.  Normal bowel sounds heard. Central nervous system: Alert and oriented. No focal neurological deficits. Extremities:  No edema, no cyanosis, no clubbing. Skin: No rashes, lesions or ulcers Psychiatry: Judgement and insight appear normal. Mood & affect appropriate.     Data Reviewed: I have personally reviewed following labs and imaging studies  CBC: Recent Labs  Lab 07/10/20 1545 07/11/20 0612 07/13/20 0546 07/13/20 1506  WBC 7.2 6.1 6.6  --   NEUTROABS 4.7  --   --   --   HGB 13.5 10.9* 11.2* 11.2*  HCT 40.8 33.3* 34.2* 34.5*  MCV 90.1 91.5 92.4  --   PLT 340 278 255  --    Basic Metabolic Panel: Recent Labs  Lab 07/10/20 1545 07/11/20 0612 07/12/20 0524 07/13/20 0546 07/13/20 1054 07/14/20 0910  NA 136 141 140 137  --  141  K 3.3* 4.3 5.0 5.3* 4.9 4.4  CL 104 113* 113* 108  --  114*  CO2 22 22 22  21*  --  22  GLUCOSE 239* 205* 100* 198*  --  122*  BUN 13 12 9  5*  --  5*  CREATININE 1.03* 0.97 1.16* 1.01*  --  1.01*  CALCIUM 9.2 8.4* 8.7* 8.5*  --  8.5*  MG  --   --   --  1.4*  --  1.7  PHOS  --   --   --  3.0  --  4.6   GFR: Estimated Creatinine Clearance: 41.8 mL/min (A) (by C-G formula based on SCr of 1.01 mg/dL (H)). Liver Function Tests: Recent Labs  Lab 07/10/20 1545 07/11/20 0612   AST 27 19  ALT 17 14  ALKPHOS 84 61  BILITOT 0.6 0.1*  PROT 7.6 5.7*  ALBUMIN 3.5 2.6*   Recent Labs  Lab 07/10/20 1545  LIPASE 32   No results for input(s): AMMONIA in the last 168 hours. Coagulation Profile: No results for input(s): INR, PROTIME in the last 168 hours. Cardiac Enzymes: No results for input(s): CKTOTAL, CKMB, CKMBINDEX, TROPONINI in the last 168 hours. BNP (last 3 results) No results for input(s): PROBNP in the last 8760 hours. HbA1C: No results  for input(s): HGBA1C in the last 72 hours. CBG: Recent Labs  Lab 07/13/20 1625 07/13/20 2143 07/14/20 0730 07/14/20 1206 07/14/20 1546  GLUCAP 95 127* 104* 140* 179*   Lipid Profile: No results for input(s): CHOL, HDL, LDLCALC, TRIG, CHOLHDL, LDLDIRECT in the last 72 hours. Thyroid Function Tests: No results for input(s): TSH, T4TOTAL, FREET4, T3FREE, THYROIDAB in the last 72 hours. Anemia Panel: No results for input(s): VITAMINB12, FOLATE, FERRITIN, TIBC, IRON, RETICCTPCT in the last 72 hours. Sepsis Labs: No results for input(s): PROCALCITON, LATICACIDVEN in the last 168 hours.  Recent Results (from the past 240 hour(s))  Urine culture     Status: Abnormal   Collection Time: 07/10/20  9:45 PM   Specimen: Urine, Clean Catch  Result Value Ref Range Status   Specimen Description   Final    URINE, CLEAN CATCH Performed at [facility], [place] [address], [place], [place] [address]    Special Requests   Final    NONE Performed at [facility], [place] [address] [place], [place] [address]    Culture >=100,000 COLONIES/mL KLEBSIELLA PNEUMONIAE (A)  Final   Report Status 07/13/2020 FINAL  Final   Organism ID, Bacteria KLEBSIELLA PNEUMONIAE (A)  Final      Susceptibility   Klebsiella pneumoniae - MIC*    AMPICILLIN >=32 RESISTANT Resistant     CEFAZOLIN <=4 SENSITIVE Sensitive     CEFEPIME <=0.12 SENSITIVE Sensitive     CEFTRIAXONE <=0.25 SENSITIVE Sensitive      CIPROFLOXACIN <=0.25 SENSITIVE Sensitive     GENTAMICIN <=1 SENSITIVE Sensitive     IMIPENEM <=0.25 SENSITIVE Sensitive     NITROFURANTOIN 64 INTERMEDIATE Intermediate     TRIMETH/SULFA <=20 SENSITIVE Sensitive     AMPICILLIN/SULBACTAM 4 SENSITIVE Sensitive     PIP/TAZO <=4 SENSITIVE Sensitive     * >=100,000 COLONIES/mL KLEBSIELLA PNEUMONIAE  SARS CORONAVIRUS 2 (TAT 6-24 HRS) Nasopharyngeal Nasopharyngeal Swab     Status: None   Collection Time: 07/10/20 10:00 PM   Specimen: Nasopharyngeal Swab  Result Value Ref Range Status   SARS Coronavirus 2 NEGATIVE NEGATIVE Final    Comment: (NOTE) SARS-CoV-2 target nucleic acids are NOT DETECTED.  The SARS-CoV-2 RNA is generally detectable in upper and lower respiratory specimens during the acute phase of infection. Negative results do not preclude SARS-CoV-2 infection, do not rule out co-infections with other pathogens, and should not be used as the sole basis for treatment or other patient management decisions. Negative results must be combined with clinical observations, patient history, and epidemiological information. The expected result is Negative.  Fact Sheet for Patients: SugarRoll.be  Fact Sheet for Healthcare Providers: https://www.woods-mathews.com/  This test is not yet approved or cleared by the [place] FDA and  has been authorized for detection and/or diagnosis of SARS-CoV-2 by FDA under an Emergency Use Authorization (EUA). This EUA will remain  in effect (meaning this test can be used) for the duration of the COVID-19 declaration under Se ction 564(b)(1) of the Act, 21 U.S.C. section 360bbb-3(b)(1), unless the authorization is terminated or revoked sooner.  Performed at [facility], [place] [address], [place], [place] [address]          Radiology Studies: No results found. Scheduled Meds: . apixaban  5 mg Oral BID  . atorvastatin  40 mg Oral QHS  .  cephALEXin  500 mg Oral Q12H  . clopidogrel  75 mg Oral Daily  . dicyclomine  10 mg Oral TID AC  .  DULoxetine  20 mg Oral Daily  . furosemide  20 mg Oral Daily  . gabapentin  300 mg Oral BID  . glipiZIDE  5 mg Oral BID AC  . insulin aspart  0-15 Units Subcutaneous TID WC  . insulin aspart  0-5 Units Subcutaneous QHS  . insulin glargine  10 Units Subcutaneous Daily  . isosorbide mononitrate  15 mg Oral Daily  . latanoprost  1 drop Both Eyes QHS  . metoprolol succinate  12.5 mg Oral Daily  . ondansetron  4 mg Intravenous Q6H  . pantoprazole  40 mg Oral BID AC  . vitamin B-12  1,000 mcg Oral Daily   Continuous Infusions: . sodium chloride 125 mL/hr at 07/14/20 1642     LOS: 4 days    Time spent: 25 mins    Malori Myers, MD [facility]   If 7PM-7AM, please contact night-coverage

## 2020-07-14 NOTE — Care Management Important Message (Signed)
Important Message  Patient Details IM Letter given to the Patient. Name: Maria Fox MRN: [id number] Date of Birth: Jul 17, 1945   Medicare Important Message Given:  Yes     Kerin Salen 07/14/2020, 11:27 AM

## 2020-07-15 DIAGNOSIS — R110 Nausea: Secondary | ICD-10-CM | POA: Diagnosis not present

## 2020-07-15 DIAGNOSIS — N3000 Acute cystitis without hematuria: Secondary | ICD-10-CM | POA: Diagnosis not present

## 2020-07-15 DIAGNOSIS — R197 Diarrhea, unspecified: Secondary | ICD-10-CM | POA: Diagnosis not present

## 2020-07-15 DIAGNOSIS — R1030 Lower abdominal pain, unspecified: Secondary | ICD-10-CM | POA: Diagnosis not present

## 2020-07-15 LAB — CBC
HCT: 34.8 % — ABNORMAL LOW (ref 36.0–46.0)
Hemoglobin: 11.2 g/dL — ABNORMAL LOW (ref 12.0–15.0)
MCH: 29.8 pg (ref 26.0–34.0)
MCHC: 32.2 g/dL (ref 30.0–36.0)
MCV: 92.6 fL (ref 80.0–100.0)
Platelets: 254 10*3/uL (ref 150–400)
RBC: 3.76 MIL/uL — ABNORMAL LOW (ref 3.87–5.11)
RDW: 14 % (ref 11.5–15.5)
WBC: 7.1 10*3/uL (ref 4.0–10.5)
nRBC: 0 % (ref 0.0–0.2)

## 2020-07-15 LAB — C DIFFICILE (CDIFF) QUICK SCRN (NO PCR REFLEX)
C Diff antigen: NEGATIVE
C Diff interpretation: NEGATIVE
C Diff toxin: NEGATIVE

## 2020-07-15 LAB — BASIC METABOLIC PANEL
Anion gap: 5 (ref 5–15)
BUN: 10 mg/dL (ref 8–23)
CO2: 26 mmol/L (ref 22–32)
Calcium: 8.7 mg/dL — ABNORMAL LOW (ref 8.9–10.3)
Chloride: 111 mmol/L (ref 98–111)
Creatinine, Ser: 1.23 mg/dL — ABNORMAL HIGH (ref 0.44–1.00)
GFR, Estimated: 45 mL/min — ABNORMAL LOW (ref >60)
Glucose, Bld: 110 mg/dL — ABNORMAL HIGH (ref 70–99)
Potassium: 4.3 mmol/L (ref 3.5–5.1)
Sodium: 142 mmol/L (ref 135–145)

## 2020-07-15 LAB — GLUCOSE, CAPILLARY
Glucose-Capillary: 119 mg/dL — ABNORMAL HIGH (ref 70–99)
Glucose-Capillary: 185 mg/dL — ABNORMAL HIGH (ref 70–99)
Glucose-Capillary: 115 mg/dL — ABNORMAL HIGH (ref 70–99)
Glucose-Capillary: 147 mg/dL — ABNORMAL HIGH (ref 70–99)

## 2020-07-15 LAB — TSH: TSH: 1.187 u[IU]/mL (ref 0.350–4.500)

## 2020-07-15 LAB — CORTISOL-AM, BLOOD: Cortisol - AM: 7.8 ug/dL (ref 6.7–22.6)

## 2020-07-15 LAB — MAGNESIUM: Magnesium: 1.6 mg/dL — ABNORMAL LOW (ref 1.7–2.4)

## 2020-07-15 MED ORDER — SODIUM CHLORIDE 0.9 % IV SOLN: INTRAVENOUS | Status: DC

## 2020-07-15 MED ORDER — MAGNESIUM SULFATE 2 GM/50ML IV SOLN
2.0000 g | Freq: Once | INTRAVENOUS | Status: AC
  Administered 2020-07-15: 2 g via INTRAVENOUS
  Filled 2020-07-15: qty 50

## 2020-07-15 NOTE — Progress Notes (Signed)
     [facility] Progress Note  CC:  Nausea and abdominal pain  Subjective:  Says that she feels terrible.  She is very "sick on her stomach" and has lower abdominal pain.  Every time she gets up to urinate to passes a lot of loose stool as well.  Objective:  Vital signs in last 24 hours: Temp:  [97.5 F (36.4 C)-98.1 F (36.7 C)] 97.6 F (36.4 C) (05/07 0538) Pulse Rate:  [69-82] 78 (05/07 0538) Resp:  [14-18] 14 (05/07 0538) BP: (129-142)/(70-80) 142/75 (05/07 0538) SpO2:  [94 %-96 %] 95 % (05/07 0538) Last BM Date: 07/14/20 General:  Alert, appears uncomfortable Heart:  Regular rate and rhythm; no murmurs Pulm:  CTAB.  No W/R/R. Abdomen:  Soft, non-distended.  BS present.  Mild lower abdominal TTP. Extremities:  Without edema. Neurologic:  Alert and oriented x 4;  grossly normal neurologically.  Intake/Output from previous day: 05/06 0701 - 05/07 0700 In: 240 [P.O.:240] Out: 400 [Urine:400]  Lab Results: Recent Labs    07/13/20 0546 07/13/20 1506 07/15/20 0611  WBC 6.6  --  7.1  HGB 11.2* 11.2* 11.2*  HCT 34.2* 34.5* 34.8*  PLT 255  --  254   BMET Recent Labs    07/13/20 0546 07/13/20 1054 07/14/20 0910 07/15/20 0611  NA 137  --  141 142  K 5.3* 4.9 4.4 4.3  CL 108  --  114* 111  CO2 21*  --  22 26  GLUCOSE 198*  --  122* 110*  BUN 5*  --  5* 10  CREATININE 1.01*  --  1.01* 1.23*  CALCIUM 8.5*  --  8.5* 8.7*   Assessment / Plan: *75 year old female with a week long history of nausea, lower abdominal pain, diarrhea.  Was admitted and being treated for UTI.  Reports that prior to the onset of the symptoms a week ago she was feeling well/normal.  Symptoms have improved.  Nausea gets better with Zofran and abdominal pain is being treated with pain medications.  Diarrhea has slowed the past day or 2.  It is possible that she had some type of infectious source/gastroenteritis.  CT scan is unremarkable and lab studies have been fairly unremarkable  as well.  She has not been vomiting and is tolerating small amounts of food.  TSH normal.  AM cortisol is pending. *History of atrial flutter/fib, DVT, PE:  On Eliquis. *History of CAD:  On Plavix.  -Continue scheduled Zofran 4 every 6 hours. -Reglan can be used in between prn or could try phenergan instead. -Continue Bentyl 10 mg TID for abdominal pain/cramping. -I have ordered stools studies, Cdiff and GI pathogen panel.    LOS: 5 days   Laban Emperor. Landrum Carbonell  07/15/2020, 9:13 AM

## 2020-07-15 NOTE — Progress Notes (Signed)
PROGRESS NOTE    Maria Fox  YOV:[id number] DOB: 17-Sep-1945 DOA: 07/10/2020 PCP: [name], MD   Brief Narrative:  This 75 years old female with PMH significant for CAD, history of CVA, DVT, history of A. fib, autoimmune deficiency syndrome, history of recurrent UTIs, diastolic CHF, diabetes mellitus, essential hypertension, hyperlipidemia presenting in the ED with burning urination associated with nausea.  Patient also reports cramping pain in both flanks and in suprapubic region. UA is consistent with UTI. CT Abd/ pelvis did not show any evidence of pyelonephritis.  Patient was admitted for UTI and started on IV antibiotics.  Patient has developed persistent nausea vomiting and diarrhea with abdominal pain.  GI is consulted, C. difficile is negative GI panel pending.  No need for EGD at this point.  Assessment & Plan:   Principal Problem:   Acute cystitis Active Problems:   Hypertension, essential   Hypokalemia   Nausea without vomiting   Chronic diastolic CHF (congestive heart failure) ([name])   Stage 3 chronic kidney disease due to type 2 diabetes mellitus ([name])   GERD (gastroesophageal reflux disease)   CAD (coronary artery disease)   Acute cystitis: Patient presented with urinary burning associated with nausea. CT abdomen does not show any evidence of acute pyelonephritis. Patient continued to remain symptomatic. Patient was started on  Azactam, given penicillin allergy. Patient reports getting ceftriaxone in the past so it was switched to ceftriaxone. Urine culture grew Klebsiella pneumonia sensitive to cipro and ceftriaxone. Continued on ceftriaxone and changed to Keflex on 5/6.  Persistent nausea:  It could be secondary to UTI ,could also be diabetic gastroparesis. Continue IV Zofran and Reglan as needed. GI consulted recommended to continue Zofran and Reglan. There is no need for any GI intervention at this point.  Diarrhea : Patient has developed diarrhea in  the setting of taking antibiotic for UTI Stool for C. difficile negative.  Follow-up GI panel.   Hypokalemia: Replaced and improved.  Hypomagnesemia: Replaced and  improved  Essential hypertension: Continue home blood pressure medications.  Atrial fibrillation :  Heart rate controlled,  continue Eliquis for anticoagulation.  CKD stage IIIa :  Avoid Nephrotoxic medications.  Serum creatinine at baseline.  Coronary artery disease : stable,  continue home medications  Diabetes  Mellitus :  Continue glipizide and sliding scale.  DVT prophylaxis:  Code Status: Full code. Family Communication: No family at bed side. Disposition Plan:   Status is: Inpatient  Remains inpatient appropriate because:Inpatient level of care appropriate due to severity of illness   Dispo: The patient is from: Home              Anticipated d/c is to: Home on 5/8              Patient currently is not medically stable to d/c.   Difficult to place patient No   Consultants:   None  Procedures:  Antimicrobials:   Anti-infectives (From admission, onward)   Start     Dose/Rate Route Frequency Ordered Stop   07/14/20 1200  cephALEXin (KEFLEX) capsule 500 mg        500 mg Oral Every 12 hours 07/14/20 1003     07/11/20 1800  cefTRIAXone (ROCEPHIN) 1 g in sodium chloride 0.9 % 100 mL IVPB  Status:  Discontinued        1 g 200 mL/hr over 30 Minutes Intravenous Every 24 hours 07/11/20 0829 07/14/20 1002   07/11/20 0000  aztreonam (AZACTAM) 1 g in sodium chloride  0.9 % 100 mL IVPB  Status:  Discontinued        1 g 200 mL/hr over 30 Minutes Intravenous Every 8 hours 07/10/20 2252 07/11/20 0829   07/10/20 1930  cefTRIAXone (ROCEPHIN) 1 g in sodium chloride 0.9 % 100 mL IVPB        1 g 200 mL/hr over 30 Minutes Intravenous  Once 07/10/20 1923 07/10/20 2026     Subjective: Patient was seen and examined at bedside.  Overnight events noted.   Patient reports worsening diarrhea last night, she continues to  complain about sick stomach.  She denies any blood in the stools.  Objective: Vitals:   07/14/20 1046 07/14/20 1359 07/14/20 2120 07/15/20 0538  BP: 137/80 (!) 142/71 129/70 (!) 142/75  Pulse: 81 69 82 78  Resp:  18 16 14   Temp:  (!) 97.5 F (36.4 C) 98.1 F (36.7 C) 97.6 F (36.4 C)  TempSrc:  Oral Oral Oral  SpO2:  96% 94% 95%  Weight:      Height:        Intake/Output Summary (Last 24 hours) at 07/15/2020 1538 Last data filed at 07/15/2020 1438 Gross per 24 hour  Intake 120 ml  Output --  Net 120 ml   Filed Weights   07/10/20 1434 07/11/20 0943  Weight: 63.8 kg 66.6 kg    Examination:  General exam: Appears calm and comfortable, not in any acute distress. Respiratory system: Clear to auscultation. Respiratory effort normal. Cardiovascular system: S1 & S2 heard, RRR. No JVD, murmurs, rubs, gallops or clicks. No pedal edema. Gastrointestinal system: Abdomen is nondistended, soft but mildly tender no organomegaly or masses felt.  Normal bowel sounds heard. Central nervous system: Alert and oriented. No focal neurological deficits. Extremities:  No edema, no cyanosis, no clubbing. Skin: No rashes, lesions or ulcers Psychiatry: Judgement and insight appear normal. Mood & affect appropriate.     Data Reviewed: I have personally reviewed following labs and imaging studies  CBC: Recent Labs  Lab 07/10/20 1545 07/11/20 0612 07/13/20 0546 07/13/20 1506 07/15/20 0611  WBC 7.2 6.1 6.6  --  7.1  NEUTROABS 4.7  --   --   --   --   HGB 13.5 10.9* 11.2* 11.2* 11.2*  HCT 40.8 33.3* 34.2* 34.5* 34.8*  MCV 90.1 91.5 92.4  --  92.6  PLT 340 278 255  --  536   Basic Metabolic Panel: Recent Labs  Lab 07/11/20 0612 07/12/20 0524 07/13/20 0546 07/13/20 1054 07/14/20 0910 07/15/20 0611  NA 141 140 137  --  141 142  K 4.3 5.0 5.3* 4.9 4.4 4.3  CL 113* 113* 108  --  114* 111  CO2 22 22 21*  --  22 26  GLUCOSE 205* 100* 198*  --  122* 110*  BUN 12 9 5*  --  5* 10   CREATININE 0.97 1.16* 1.01*  --  1.01* 1.23*  CALCIUM 8.4* 8.7* 8.5*  --  8.5* 8.7*  MG  --   --  1.4*  --  1.7 1.6*  PHOS  --   --  3.0  --  4.6  --    GFR: Estimated Creatinine Clearance: 34.3 mL/min (A) (by C-G formula based on SCr of 1.23 mg/dL (H)). Liver Function Tests: Recent Labs  Lab 07/10/20 1545 07/11/20 0612  AST 27 19  ALT 17 14  ALKPHOS 84 61  BILITOT 0.6 0.1*  PROT 7.6 5.7*  ALBUMIN 3.5 2.6*   Recent Labs  Lab 07/10/20 1545  LIPASE 32   No results for input(s): AMMONIA in the last 168 hours. Coagulation Profile: No results for input(s): INR, PROTIME in the last 168 hours. Cardiac Enzymes: No results for input(s): CKTOTAL, CKMB, CKMBINDEX, TROPONINI in the last 168 hours. BNP (last 3 results) No results for input(s): PROBNP in the last 8760 hours. HbA1C: No results for input(s): HGBA1C in the last 72 hours. CBG: Recent Labs  Lab 07/14/20 1546 07/14/20 1755 07/14/20 2123 07/15/20 0759 07/15/20 1216  GLUCAP 179* 147* 93 119* 185*   Lipid Profile: No results for input(s): CHOL, HDL, LDLCALC, TRIG, CHOLHDL, LDLDIRECT in the last 72 hours. Thyroid Function Tests: Recent Labs    07/15/20 0611  TSH 1.187   Anemia Panel: No results for input(s): VITAMINB12, FOLATE, FERRITIN, TIBC, IRON, RETICCTPCT in the last 72 hours. Sepsis Labs: No results for input(s): PROCALCITON, LATICACIDVEN in the last 168 hours.  Recent Results (from the past 240 hour(s))  Urine culture     Status: Abnormal   Collection Time: 07/10/20  9:45 PM   Specimen: Urine, Clean Catch  Result Value Ref Range Status   Specimen Description   Final    URINE, CLEAN CATCH Performed at [facility], [place] [address], [place], [place] [address]    Special Requests   Final    NONE Performed at [facility], [place] [address], [place], [place] [address]    Culture >=100,000 COLONIES/mL KLEBSIELLA PNEUMONIAE (A)  Final   Report Status 07/13/2020 FINAL   Final   Organism ID, Bacteria KLEBSIELLA PNEUMONIAE (A)  Final      Susceptibility   Klebsiella pneumoniae - MIC*    AMPICILLIN >=32 RESISTANT Resistant     CEFAZOLIN <=4 SENSITIVE Sensitive     CEFEPIME <=0.12 SENSITIVE Sensitive     CEFTRIAXONE <=0.25 SENSITIVE Sensitive     CIPROFLOXACIN <=0.25 SENSITIVE Sensitive     GENTAMICIN <=1 SENSITIVE Sensitive     IMIPENEM <=0.25 SENSITIVE Sensitive     NITROFURANTOIN 64 INTERMEDIATE Intermediate     TRIMETH/SULFA <=20 SENSITIVE Sensitive     AMPICILLIN/SULBACTAM 4 SENSITIVE Sensitive     PIP/TAZO <=4 SENSITIVE Sensitive     * >=100,000 COLONIES/mL KLEBSIELLA PNEUMONIAE  SARS CORONAVIRUS 2 (TAT 6-24 HRS) Nasopharyngeal Nasopharyngeal Swab     Status: None   Collection Time: 07/10/20 10:00 PM   Specimen: Nasopharyngeal Swab  Result Value Ref Range Status   SARS Coronavirus 2 NEGATIVE NEGATIVE Final    Comment: (NOTE) SARS-CoV-2 target nucleic acids are NOT DETECTED.  The SARS-CoV-2 RNA is generally detectable in upper and lower respiratory specimens during the acute phase of infection. Negative results do not preclude SARS-CoV-2 infection, do not rule out co-infections with other pathogens, and should not be used as the sole basis for treatment or other patient management decisions. Negative results must be combined with clinical observations, patient history, and epidemiological information. The expected result is Negative.  Fact Sheet for Patients: SugarRoll.be  Fact Sheet for Healthcare Providers: https://www.woods-mathews.com/  This test is not yet approved or cleared by the [place] FDA and  has been authorized for detection and/or diagnosis of SARS-CoV-2 by FDA under an Emergency Use Authorization (EUA). This EUA will remain  in effect (meaning this test can be used) for the duration of the COVID-19 declaration under Se ction 564(b)(1) of the Act, 21 U.S.C. section  360bbb-3(b)(1), unless the authorization is terminated or revoked sooner.  Performed at [facility], [place]  [address], [place], [place] [address]   C Difficile Quick Screen (NO PCR Reflex)     Status: None   Collection Time: 07/15/20 12:58 PM   Specimen: STOOL  Result Value Ref Range Status   C Diff antigen NEGATIVE NEGATIVE Final   C Diff toxin NEGATIVE NEGATIVE Final   C Diff interpretation NEGATIVE  Final    Comment: Performed at [facility], [place] [address], [place], [place] [address]         Radiology Studies: No results found. Scheduled Meds: . apixaban  5 mg Oral BID  . atorvastatin  40 mg Oral QHS  . cephALEXin  500 mg Oral Q12H  . clopidogrel  75 mg Oral Daily  . dicyclomine  10 mg Oral TID AC  . DULoxetine  20 mg Oral Daily  . furosemide  20 mg Oral Daily  . gabapentin  300 mg Oral BID  . glipiZIDE  5 mg Oral BID AC  . insulin aspart  0-15 Units Subcutaneous TID WC  . insulin aspart  0-5 Units Subcutaneous QHS  . insulin glargine  10 Units Subcutaneous Daily  . isosorbide mononitrate  15 mg Oral Daily  . latanoprost  1 drop Both Eyes QHS  . metoprolol succinate  12.5 mg Oral Daily  . ondansetron  4 mg Intravenous Q6H  . pantoprazole  40 mg Oral BID AC  . vitamin B-12  1,000 mcg Oral Daily   Continuous Infusions: . sodium chloride 125 mL/hr at 07/15/20 0825     LOS: 5 days    Time spent: 25 mins    Larrisa Cravey, MD [facility]   If 7PM-7AM, please contact night-coverage

## 2020-07-16 DIAGNOSIS — I2510 Atherosclerotic heart disease of native coronary artery without angina pectoris: Secondary | ICD-10-CM | POA: Diagnosis not present

## 2020-07-16 DIAGNOSIS — R197 Diarrhea, unspecified: Secondary | ICD-10-CM | POA: Diagnosis not present

## 2020-07-16 DIAGNOSIS — R112 Nausea with vomiting, unspecified: Secondary | ICD-10-CM | POA: Diagnosis not present

## 2020-07-16 DIAGNOSIS — R1110 Vomiting, unspecified: Secondary | ICD-10-CM

## 2020-07-16 DIAGNOSIS — R1030 Lower abdominal pain, unspecified: Secondary | ICD-10-CM

## 2020-07-16 DIAGNOSIS — N3000 Acute cystitis without hematuria: Secondary | ICD-10-CM | POA: Diagnosis not present

## 2020-07-16 DIAGNOSIS — K219 Gastro-esophageal reflux disease without esophagitis: Secondary | ICD-10-CM | POA: Diagnosis not present

## 2020-07-16 DIAGNOSIS — I2583 Coronary atherosclerosis due to lipid rich plaque: Secondary | ICD-10-CM

## 2020-07-16 DIAGNOSIS — I5032 Chronic diastolic (congestive) heart failure: Secondary | ICD-10-CM | POA: Diagnosis not present

## 2020-07-16 LAB — CBC
HCT: 34.2 % — ABNORMAL LOW (ref 36.0–46.0)
Hemoglobin: 11 g/dL — ABNORMAL LOW (ref 12.0–15.0)
MCH: 30 pg (ref 26.0–34.0)
MCHC: 32.2 g/dL (ref 30.0–36.0)
MCV: 93.2 fL (ref 80.0–100.0)
Platelets: 266 10*3/uL (ref 150–400)
RBC: 3.67 MIL/uL — ABNORMAL LOW (ref 3.87–5.11)
RDW: 14 % (ref 11.5–15.5)
WBC: 6.4 10*3/uL (ref 4.0–10.5)
nRBC: 0 % (ref 0.0–0.2)

## 2020-07-16 LAB — GLUCOSE, CAPILLARY
Glucose-Capillary: 140 mg/dL — ABNORMAL HIGH (ref 70–99)
Glucose-Capillary: 165 mg/dL — ABNORMAL HIGH (ref 70–99)
Glucose-Capillary: 69 mg/dL — ABNORMAL LOW (ref 70–99)
Glucose-Capillary: 126 mg/dL — ABNORMAL HIGH (ref 70–99)

## 2020-07-16 LAB — BASIC METABOLIC PANEL
Anion gap: 7 (ref 5–15)
BUN: 11 mg/dL (ref 8–23)
CO2: 24 mmol/L (ref 22–32)
Calcium: 8.6 mg/dL — ABNORMAL LOW (ref 8.9–10.3)
Chloride: 109 mmol/L (ref 98–111)
Creatinine, Ser: 1.27 mg/dL — ABNORMAL HIGH (ref 0.44–1.00)
GFR, Estimated: 44 mL/min — ABNORMAL LOW (ref >60)
Glucose, Bld: 149 mg/dL — ABNORMAL HIGH (ref 70–99)
Potassium: 4.4 mmol/L (ref 3.5–5.1)
Sodium: 140 mmol/L (ref 135–145)

## 2020-07-16 LAB — GASTROINTESTINAL PANEL BY PCR, STOOL (REPLACES STOOL CULTURE)

## 2020-07-16 LAB — MAGNESIUM: Magnesium: 1.8 mg/dL (ref 1.7–2.4)

## 2020-07-16 LAB — PHOSPHORUS: Phosphorus: 3.5 mg/dL (ref 2.5–4.6)

## 2020-07-16 MED ORDER — METOCLOPRAMIDE HCL 5 MG PO TABS
5.0000 mg | ORAL_TABLET | Freq: Three times a day (TID) | ORAL | Status: DC
  Administered 2020-07-16 – 2020-07-17 (×2): 5 mg via ORAL
  Filled 2020-07-16 (×2): qty 1

## 2020-07-16 MED ORDER — MAGNESIUM SULFATE 2 GM/50ML IV SOLN
2.0000 g | Freq: Once | INTRAVENOUS | Status: AC
  Administered 2020-07-16: 2 g via INTRAVENOUS
  Filled 2020-07-16: qty 50

## 2020-07-16 MED ORDER — GERHARDT'S BUTT CREAM
TOPICAL_CREAM | Freq: Four times a day (QID) | CUTANEOUS | Status: DC
  Administered 2020-07-17 – 2020-07-26 (×6): 1 via TOPICAL
  Filled 2020-07-16 (×2): qty 1

## 2020-07-16 MED ORDER — ONDANSETRON HCL 4 MG/2ML IJ SOLN
4.0000 mg | INTRAMUSCULAR | Status: DC | PRN
  Administered 2020-07-17 – 2020-07-25 (×10): 4 mg via INTRAVENOUS
  Filled 2020-07-16 (×10): qty 2

## 2020-07-16 MED ORDER — LOPERAMIDE HCL 2 MG PO CAPS
2.0000 mg | ORAL_CAPSULE | ORAL | Status: DC | PRN
  Administered 2020-07-16: 2 mg via ORAL
  Filled 2020-07-16: qty 1

## 2020-07-16 MED ORDER — LACTATED RINGERS IV SOLN: INTRAVENOUS | Status: DC

## 2020-07-16 NOTE — Progress Notes (Addendum)
Attending physician's note   I have taken an interval history, reviewed the chart and examined the patient. I agree with the Advanced Practitioner's note, impression, and recommendations as outlined.   Still with loose, nonbloody stools and lower abdominal cramping pain along with nausea but no recent emesis.  Scheduled Zofran seems to be working, but orders now look like Reglan is scheduled and Zofran used prn.  We will see if this makes a meaningful difference for her.  - Agree with trial of Bentyl 10 mg scheduled TID - Unless issue with QTC, recommend increasing Reglan to 10 mg scheduled, with continued Zofran prn breakthrough nausea - C. difficile and GI PCR panel both negative.  Okay to use Imodium or Lomotil - If ongoing symptoms, could consider repeat cross-sectional imaging vs colonoscopy.  Will monitor over the next day or so  Gittel Mccamish, DO, FACG [phone] office             [facility] Progress Note  CC:  Nausea, abdominal pain, diarrhea  Subjective:  Still with nausea, lower abdominal pain, and loose stools.  Says that the zofran works, but has not been asking for the Reglan in between.  Cdiff negative but GI pathogen panel is pending.  Every time I see her she is sleeping with a wet wash cloth on her eyes/head.  Reports ongoing headache as well.  But she has continued to eat.  Objective:  Vital signs in last 24 hours: Temp:  [97.5 F (36.4 C)-97.9 F (36.6 C)] 97.5 F (36.4 C) (05/08 0618) Pulse Rate:  [76-79] 76 (05/08 0618) Resp:  [18] 18 (05/07 2122) BP: (144-147)/(75-77) 147/77 (05/08 0618) SpO2:  [93 %-95 %] 95 % (05/08 0618) Last BM Date: 07/15/20 General:  Alert, Well-developed, in NAD Heart:  Regular rate and rhythm; no murmurs Pulm:  CTAB.  No W/R/R. Abdomen:  Soft, non-distended.  BS present.  Lower abdominal TTP. Extremities:  Without edema. Neurologic:  Alert and oriented x 4;  grossly normal neurologically. Psych:  Alert  and cooperative. Normal mood and affect.  Intake/Output from previous day: 05/07 0701 - 05/08 0700 In: 0630 [P.O.:1320; I.V.:125] Out: 1150 [Urine:1150] Intake/Output this shift: Total I/O In: 1613.7 [P.O.:120; I.V.:1493.7] Out: 400 [Urine:400]  Lab Results: Recent Labs    07/13/20 1506 07/15/20 0611 07/16/20 0547  WBC  --  7.1 6.4  HGB 11.2* 11.2* 11.0*  HCT 34.5* 34.8* 34.2*  PLT  --  254 266   BMET Recent Labs    07/14/20 0910 07/15/20 0611 07/16/20 0547  NA 141 142 140  K 4.4 4.3 4.4  CL 114* 111 109  CO2 22 26 24   GLUCOSE 122* 110* 149*  BUN 5* 10 11  CREATININE 1.01* 1.23* 1.27*  CALCIUM 8.5* 8.7* 8.6*   Assessment / Plan: *75 year old female with a week long history of nausea, lower abdominal pain, diarrhea.Was admitted and being treated for UTI. Reports that prior to the onset of the symptoms a week ago she was feeling well/normal.  Nausea gets better with Zofran and abdominal pain is being treated with pain medications. Diarrhea has slowed the past day or 2.It is possible that she had some type of infectious source/gastroenteritis. CT scan is unremarkable and lab studies have been fairly unremarkable as well. She has not been vomiting and is tolerating small amounts of food.  TSH normal.  AM cortisol is normal.  Cdiff negative.  ? If symptoms being exacerbated by abx being used to treat her UTI and if  the nausea is related to her headache. *History of atrial flutter/fib, DVT, PE: On Eliquis. *History of CAD: On Plavix.  -Continue scheduled Zofran4 every 6 hours. -Reglan can be used in between prn or could try phenergan instead. -Continue Bentyl 10 mg TID for abdominal pain/cramping. -Await results of GI pathogen panel, but if negative then may need to consider repeat CT or colonoscopy.   LOS: 6 days   Laban Emperor. Zehr  07/16/2020, 9:22 AM

## 2020-07-16 NOTE — Progress Notes (Addendum)
PROGRESS NOTE    Maria Fox  FAO:[id number] DOB: [birth year] DOA: 07/10/2020 PCP: [name], MD    Brief Narrative:  Maria Fox was admitted to the hospital with working diagnosis of acute cystitis, complicated by intractable nausea and vomiting.  75 year old female with the past medical history for coronary artery disease, history of CVA, DVT, atrial fibrillation, diastolic heart failure, type 2 diabetes mellitus, hypertension, dyslipidemia and autoimmune syndrome who presented with dysuria and persistent nausea. She reported colicky suprapubic and bilateral flanks pain, associated with persistent nausea.  On her initial physical examination temperature 97.7, blood pressure 167/96, heart rate 91, respiratory rate 24, oxygen saturation 92% on room air.  She had dry mucous membranes, lungs clear to auscultation bilaterally, heart S1-S2, present, rhythmic, abdomen tender at the suprapubic region, no lower extremity edema.  Sodium 136, potassium 3.3, chloride 104, bicarb 23, glucose 239, BUN 13, creatinine 1.0, white count 7.2, hemoglobin 13.5, hematocrit 40.8, platelet count 340. SARS COVID-19 negative.  Urinalysis specific gravity 1.023, 30 protein, 21-50 red cells,> 50 white cells. CT abdomen pelvis with diverticulosis, no diverticulitis, no renal calculi or obstruction.  Patient was placed on intravenous antibiotics and supportive intravenous fluids.  Further work-up with stool studies was negative for C. difficile.  Due to persistent nausea, GI was consulted. She was placed on antispasmodic agents along with antiemetics.   Assessment & Plan:   Principal Problem:   Acute cystitis Active Problems:   Hypertension, essential   Hypokalemia   Nausea without vomiting   Chronic diastolic CHF (congestive heart failure) ([name])   Stage 3 chronic kidney disease due to type 2 diabetes mellitus ([name])   GERD (gastroesophageal reflux disease)   CAD (coronary artery disease)   1. Acute  cystitis, urinary tract infection, Klebsiella Pneumonia (present on admission) no sepsis. Wbc is 6,4, urine culture positive for Klebsiella pneumonia sensitive to cephalosporins.   Patient has been on effective antibiotic therapy for the last 6 days since admission. Patient has been afebrile and no further flank pain.   Plan to discontinue antibiotic therapy today.  Consult PT, OT and nutrition.   2. Intractable nausea. Continue to have severe and persistent symptoms, improved with antiemetics for a short period of time.  C diff negative, stool panel still pending.   Will increase zofran frequency to as needed every 4 hrs and will change metoclopramide to tid with meals.  Change diet to soft.  On pantoprazole 40 mg po bid and dicyclomine  Continue with supportive IV fluids at a lower rate.   3. HTN/ CAD/ Diastolic heart failure. Continue blood pressure control with isosorbide and metoprolol.  Patient chest pain free, continue with aspirin, clopidogrel and atorvastatin.    No clinical signs of heart failure decompensation.   4. Atrial fibrillation. Currently rate control with metoprolol, continue anticoagulation with apixaban.   5. CKD stage 3a with hypokalemia and hypomagnesemia. Stable renal function with serum cr at 1.27 with k at 4.4 and serum bicarbonate at 24. Mg is 1.8. Na 140 and Cl 109.  Plan to continue with hydration at lower rate, balanced electrolyte solutions. Discontinue furosemide.  Add 2 g Mag sulfate.   6. T2DM/ dyslipidemia. Fasting glucose this am 149, capillary  147, 140, 165.  Discontinue glipizide and continue with insulin sliding scale for glucose cover and monitoring. Basal insulin with 10 units glargine Patient with poor oral intake due to nausea.  Continue with atorvastatin   7. Depression. Continue with duloxetine and trazodone.   Patient continue  to be at high risk for worsening nausea and diarrhea.   Status is: Inpatient  Remains inpatient  appropriate because:IV treatments appropriate due to intensity of illness or inability to take PO   Dispo: The patient is from: Home              Anticipated d/c is to: Home              Patient currently is not medically stable to d/c.   Difficult to place patient No   DVT prophylaxis:  apixaban  Code Status:   full  Family Communication:  No family at the bedside     Consultants:   GI   Subjective: Patient continue to have persistent nausea and diarrhea, positive anterior abdominal pain, no chest pain or dyspnea.   Objective: Vitals:   07/15/20 0538 07/15/20 2122 07/16/20 0618 07/16/20 1354  BP: (!) 142/75 (!) 144/75 (!) 147/77 134/75  Pulse: 78 79 76 82  Resp: 14 18  15   Temp: 97.6 F (36.4 C) 97.9 F (36.6 C) (!) 97.5 F (36.4 C) 98 F (36.7 C)  TempSrc: Oral Oral Oral Oral  SpO2: 95% 93% 95% 95%  Weight:      Height:        Intake/Output Summary (Last 24 hours) at 07/16/2020 1426 Last data filed at 07/16/2020 1356 Gross per 24 hour  Intake 3298.7 ml  Output 1950 ml  Net 1348.7 ml   Filed Weights   07/10/20 1434 07/11/20 0943  Weight: 63.8 kg 66.6 kg    Examination:   General: Not in pain or dyspnea deconditioned  Neurology: Awake and alert, non focal  E ENT: positive pallor, no icterus, oral mucosa moist Cardiovascular: No JVD. S1-S2 present, rhythmic, no gallops, rubs, or murmurs. No lower extremity edema. Pulmonary: positive breath sounds bilaterally, with no wheezing, rhonchi or rales. Gastrointestinal. Abdomen mild distended, tender to deep palpation, all quadrants no rebound  Skin. No rashes Musculoskeletal: no joint deformities     Data Reviewed: I have personally reviewed following labs and imaging studies  CBC: Recent Labs  Lab 07/10/20 1545 07/11/20 0612 07/13/20 0546 07/13/20 1506 07/15/20 0611 07/16/20 0547  WBC 7.2 6.1 6.6  --  7.1 6.4  NEUTROABS 4.7  --   --   --   --   --   HGB 13.5 10.9* 11.2* 11.2* 11.2* 11.0*  HCT 40.8  33.3* 34.2* 34.5* 34.8* 34.2*  MCV 90.1 91.5 92.4  --  92.6 93.2  PLT 340 278 255  --  254 945   Basic Metabolic Panel: Recent Labs  Lab 07/12/20 0524 07/13/20 0546 07/13/20 1054 07/14/20 0910 07/15/20 0611 07/16/20 0547  NA 140 137  --  141 142 140  K 5.0 5.3* 4.9 4.4 4.3 4.4  CL 113* 108  --  114* 111 109  CO2 22 21*  --  22 26 24   GLUCOSE 100* 198*  --  122* 110* 149*  BUN 9 5*  --  5* 10 11  CREATININE 1.16* 1.01*  --  1.01* 1.23* 1.27*  CALCIUM 8.7* 8.5*  --  8.5* 8.7* 8.6*  MG  --  1.4*  --  1.7 1.6* 1.8  PHOS  --  3.0  --  4.6  --  3.5   GFR: Estimated Creatinine Clearance: 33.2 mL/min (A) (by C-G formula based on SCr of 1.27 mg/dL (H)). Liver Function Tests: Recent Labs  Lab 07/10/20 1545 07/11/20 0612  AST 27 19  ALT 17 14  ALKPHOS 84 61  BILITOT 0.6 0.1*  PROT 7.6 5.7*  ALBUMIN 3.5 2.6*   Recent Labs  Lab 07/10/20 1545  LIPASE 32   No results for input(s): AMMONIA in the last 168 hours. Coagulation Profile: No results for input(s): INR, PROTIME in the last 168 hours. Cardiac Enzymes: No results for input(s): CKTOTAL, CKMB, CKMBINDEX, TROPONINI in the last 168 hours. BNP (last 3 results) No results for input(s): PROBNP in the last 8760 hours. HbA1C: No results for input(s): HGBA1C in the last 72 hours. CBG: Recent Labs  Lab 07/15/20 1216 07/15/20 1812 07/15/20 2123 07/16/20 0839 07/16/20 1220  GLUCAP 185* 115* 147* 140* 165*   Lipid Profile: No results for input(s): CHOL, HDL, LDLCALC, TRIG, CHOLHDL, LDLDIRECT in the last 72 hours. Thyroid Function Tests: Recent Labs    07/15/20 0611  TSH 1.187   Anemia Panel: No results for input(s): VITAMINB12, FOLATE, FERRITIN, TIBC, IRON, RETICCTPCT in the last 72 hours.    Radiology Studies: I have reviewed all of the imaging during this hospital visit personally     Scheduled Meds: . apixaban  5 mg Oral BID  . atorvastatin  40 mg Oral QHS  . cephALEXin  500 mg Oral Q12H  . clopidogrel   75 mg Oral Daily  . dicyclomine  10 mg Oral TID AC  . DULoxetine  20 mg Oral Daily  . furosemide  20 mg Oral Daily  . gabapentin  300 mg Oral BID  . Gerhardt's butt cream   Topical QID  . glipiZIDE  5 mg Oral BID AC  . insulin aspart  0-15 Units Subcutaneous TID WC  . insulin aspart  0-5 Units Subcutaneous QHS  . insulin glargine  10 Units Subcutaneous Daily  . isosorbide mononitrate  15 mg Oral Daily  . latanoprost  1 drop Both Eyes QHS  . metoprolol succinate  12.5 mg Oral Daily  . ondansetron  4 mg Intravenous Q6H  . pantoprazole  40 mg Oral BID AC  . vitamin B-12  1,000 mcg Oral Daily   Continuous Infusions: . sodium chloride 125 mL/hr at 07/16/20 0209     LOS: 6 days        Jheremy Boger Gerome Apley, MD

## 2020-07-17 DIAGNOSIS — N3000 Acute cystitis without hematuria: Secondary | ICD-10-CM | POA: Diagnosis not present

## 2020-07-17 DIAGNOSIS — R159 Full incontinence of feces: Secondary | ICD-10-CM

## 2020-07-17 DIAGNOSIS — E1122 Type 2 diabetes mellitus with diabetic chronic kidney disease: Secondary | ICD-10-CM

## 2020-07-17 DIAGNOSIS — I2510 Atherosclerotic heart disease of native coronary artery without angina pectoris: Secondary | ICD-10-CM | POA: Diagnosis not present

## 2020-07-17 DIAGNOSIS — N183 Chronic kidney disease, stage 3 unspecified: Secondary | ICD-10-CM

## 2020-07-17 DIAGNOSIS — R110 Nausea: Secondary | ICD-10-CM

## 2020-07-17 DIAGNOSIS — I10 Essential (primary) hypertension: Secondary | ICD-10-CM | POA: Diagnosis not present

## 2020-07-17 DIAGNOSIS — R197 Diarrhea, unspecified: Secondary | ICD-10-CM | POA: Diagnosis not present

## 2020-07-17 LAB — BASIC METABOLIC PANEL
Anion gap: 7 (ref 5–15)
BUN: 13 mg/dL (ref 8–23)
CO2: 25 mmol/L (ref 22–32)
Calcium: 8.7 mg/dL — ABNORMAL LOW (ref 8.9–10.3)
Chloride: 105 mmol/L (ref 98–111)
Creatinine, Ser: 1.05 mg/dL — ABNORMAL HIGH (ref 0.44–1.00)
GFR, Estimated: 55 mL/min — ABNORMAL LOW (ref >60)
Glucose, Bld: 189 mg/dL — ABNORMAL HIGH (ref 70–99)
Potassium: 4.5 mmol/L (ref 3.5–5.1)
Sodium: 137 mmol/L (ref 135–145)

## 2020-07-17 LAB — GLUCOSE, CAPILLARY
Glucose-Capillary: 149 mg/dL — ABNORMAL HIGH (ref 70–99)
Glucose-Capillary: 261 mg/dL — ABNORMAL HIGH (ref 70–99)
Glucose-Capillary: 114 mg/dL — ABNORMAL HIGH (ref 70–99)

## 2020-07-17 MED ORDER — OXYCODONE HCL 5 MG PO TABS
5.0000 mg | ORAL_TABLET | ORAL | Status: DC | PRN
  Administered 2020-07-17 – 2020-07-26 (×12): 5 mg via ORAL
  Filled 2020-07-17 (×12): qty 1

## 2020-07-17 MED ORDER — BOOST / RESOURCE BREEZE PO LIQD CUSTOM
1.0000 | Freq: Three times a day (TID) | ORAL | Status: DC
  Administered 2020-07-17 – 2020-07-23 (×12): 1 via ORAL

## 2020-07-17 NOTE — Plan of Care (Signed)
Pt was able to sleep very well last night; she had an episode of incontinence during sleep; sheets and gown changed; hygiene provided; IV dressing changed, pt tolerated procedure very well. No s/s of acute distress or pain reported or observed; bed at lowest position for safety and call light within reach.

## 2020-07-17 NOTE — Evaluation (Signed)
Occupational Therapy Evaluation Patient Details Name: Maria Fox MRN: [id number] DOB: Jul 11, 1945 Today's Date: 07/17/2020    History of Present Illness Pt is 75 yo female with HTN, HLD, DM II, DVT, history of CVA, CAD status post CABG, admitted with cystitis with intractable nausea/vomiting/diarrhea.   Clinical Impression   Patient is currently requiring assistance with ADLs including minimal assist with toileting, with LE dressing, and with bathing, and setup assist with seated grooming and UE dressing, all of which is below patient's typical baseline of being Independent.  During this evaluation, patient was limited by impaired dynamic standing balance with need of Min Assist to prevent fall during LOB, decreased awareness of current deficits, head and stomach pain, all of which has the potential to impact patient's safety and independence during functional mobility, as well as performance for ADLs. Maria Fox "6-clicks" Daily Activity Inpatient Short Form score of 19/24 indicates 42.80% ADL impairment this session. Patient lives with her friend, Maria Fox, who is able to provide 24/7 supervision and assistance.  Patient demonstrates good rehab potential, and should benefit from continued skilled occupational therapy services while in acute care to maximize safety, independence and quality of life at home.  Continued occupational therapy services in the home is recommended for now, and will change as needed based on pt's progress.  ?   Follow Up Recommendations  Home health OT;Supervision/Assistance - 24 hour    Equipment Recommendations       Recommendations for Other Services       Precautions / Restrictions Precautions Precautions: Fall Restrictions Weight Bearing Restrictions: No      Mobility Bed Mobility Overal bed mobility: Modified Independent             General bed mobility comments: Sit to supine, scooting laterally along bed: Mod I. Patient Response:  Cooperative  Transfers Overall transfer level: Needs assistance Equipment used: None Transfers: Sit to/from [facility] Sit to Stand: Min guard Stand pivot transfers: Min guard       General transfer comment: Pt requires Min guard steadying.    Balance Overall balance assessment: Needs assistance Sitting-balance support: Feet supported Sitting balance-Maria Fox: Good       Standing balance-Maria Fox: Poor Standing balance comment: Fair static, poor dynamic with LOB and reaching for furniture or therapist's hand to steady herself.                           ADL either performed or assessed with clinical judgement   ADL Overall ADL's : Needs assistance/impaired Eating/Feeding: Independent Eating/Feeding Details (indicate cue type and reason): Deferred as pt did not want to eat, however pt shows UE function sufficient for self-feeding independently. Grooming: Wash/dry hands;Min guard;Standing   Upper Body Bathing: Sitting;Set up   Lower Body Bathing: Min guard;Sitting/lateral leans;Sit to/from stand   Upper Body Dressing : Set up;Sitting   Lower Body Dressing: Minimal assistance;Sit to/from stand   Toilet Transfer: Grab bars;Ambulation;Regular Toilet;Cueing for safety;Min guard;Minimal assistance Toilet Transfer Details (indicate cue type and reason): Pt declined use of AD to ambulate to bathroom. Pt required Min guard assist as pt furniture cruising in room. Pt with one LOB and need of Min Assist to correct. Toileting- Clothing Manipulation and Hygiene: Minimal assistance;Sitting/lateral lean Toileting - Clothing Manipulation Details (indicate cue type and reason): Min Assist needed as pt asked for assistance with clothing mangement. Pt perforned peri care sitting on toilet.     Functional mobility during ADLs:  Min guard;Minimal assistance       Vision Patient Visual Report: No change from baseline Additional Comments: Sensitive to light.      Perception     Praxis      Pertinent Vitals/Pain Pain Assessment: 0-10 Pain Score: 8  Pain Location: head and stomach ache. Pain Descriptors / Indicators: Aching Pain Intervention(s): Limited activity within patient's tolerance;Monitored during session;Repositioned;Heat applied;Premedicated before session (Cold washcloth to head. Hot pack to stomach.)     Hand Dominance Left   Extremity/Trunk Assessment Upper Extremity Assessment Upper Extremity Assessment: Overall WFL for tasks assessed   Lower Extremity Assessment Lower Extremity Assessment: Defer to PT evaluation   Cervical / Trunk Assessment Cervical / Trunk Assessment: Normal   Communication Communication Communication: No difficulties   Cognition Arousal/Alertness: Awake/alert Behavior During Therapy: WFL for tasks assessed/performed Overall Cognitive Status: Within Functional Limits for tasks assessed                                     General Comments       Exercises     Shoulder Instructions      Home Living Family/patient expects to be discharged to:: Private residence Living Arrangements: Non-relatives/Friends (Pt reports that she lives in her friend, Maria Fox's home.) Available Help at Discharge: Friend(s);Available 24 hours/day Type of Home: House Home Access: Level entry     Home Layout: One level     Bathroom Shower/Tub: Teacher, early years/pre: Standard     Home Equipment: Shower seat;Hand held shower head;Bedside commode;Cane - quad;Walker - standard;Grab bars - tub/shower          Prior Functioning/Environment Level of Independence: Needs assistance  Gait / Transfers Assistance Needed: Pt reports ambulating without AD but tolerating only limited distances. ADL's / Homemaking Assistance Needed: Pt reports completing own BADLs independently and Maria Fox completes most IADLs. Pt reports that they often go out to eat. Maria Fox provides transportation as pt does not  drive. Pt receives a little assistance with setup of her meds.            OT Problem List: Pain;Decreased activity tolerance;Impaired balance (sitting and/or standing);Decreased knowledge of use of DME or AE      OT Treatment/Interventions: Self-care/ADL training;Therapeutic activities;DME and/or AE instruction;Patient/family education;Balance training    OT Goals(Current goals can be found in the care plan section) Acute Rehab OT Goals Patient Stated Goal: Feel better. OT Goal Formulation: With patient Time For Goal Achievement: 07/31/20 Potential to Achieve Goals: Good ADL Goals Pt Will Perform Lower Body Dressing: with modified independence Pt Will Transfer to Toilet: with modified independence Pt Will Perform Toileting - Clothing Manipulation and hygiene: with modified independence Additional ADL Goal #1: Pt will engage in 8 min standing functional activities without loss of balance, in order to demonstrate improved activity tolerance and balance needed to perform ADLs safely at home.  OT Frequency: Min 2X/week   Barriers to D/C:    None known       Co-evaluation              AM-PAC OT "6 Clicks" Daily Activity     Outcome Measure Help from another person eating meals?: None Help from another person taking care of personal grooming?: A Little Help from another person toileting, which includes using toliet, bedpan, or urinal?: A Little Help from another person bathing (including washing, rinsing, drying)?: A Little Help from another person  to put on and taking off regular upper body clothing?: A Little Help from another person to put on and taking off regular lower body clothing?: A Little 6 Click Score: 19   End of Session Equipment Utilized During Treatment: Gait belt Nurse Communication:  (Nurse not located.)  Activity Tolerance: Patient tolerated treatment well Patient left: in bed;with call bell/phone within reach;with bed alarm set  OT Visit Diagnosis:  Unsteadiness on feet (R26.81);Pain Pain - part of body:  (head/stomach)                Time: 1310-1320 OT Time Calculation (min): 10 min Charges:  OT General Charges $OT Visit: 1 Visit OT Evaluation $OT Eval Low Complexity: 1 Low  Talin Feister, OT Acute Rehab Services Office: [phone] 07/17/2020  Julien Girt 07/17/2020, 1:41 PM

## 2020-07-17 NOTE — Progress Notes (Addendum)
PROGRESS NOTE    KERENSA NICKLAS  UUV:[id number] DOB: 1945-07-06 DOA: 07/10/2020 PCP: [name], MD    Brief Narrative:  Mrs. Dao was admitted to the hospital with working diagnosis of acute cystitis, complicated by intractable nausea and vomiting.  75 year old female with the past medical history for coronary artery disease, history of CVA, DVT, atrial fibrillation, diastolic heart failure, type 2 diabetes mellitus, hypertension, dyslipidemia and autoimmune syndrome who presented with dysuria and persistent nausea. She reported colicky suprapubic and bilateral flanks pain, associated with persistent nausea.  On her initial physical examination temperature 97.7, blood pressure 167/96, heart rate 91, respiratory rate 24, oxygen saturation 92% on room air.  She had dry mucous membranes, lungs clear to auscultation bilaterally, heart S1-S2, present, rhythmic, abdomen tender at the suprapubic region, no lower extremity edema.  Sodium 136, potassium 3.3, chloride 104, bicarb 23, glucose 239, BUN 13, creatinine 1.0, white count 7.2, hemoglobin 13.5, hematocrit 40.8, platelet count 340. SARS COVID-19 negative.  Urinalysis specific gravity 1.023, 30 protein, 21-50 red cells,> 50 white cells. CT abdomen pelvis with diverticulosis, no diverticulitis, no renal calculi or obstruction.  Patient was placed on intravenous antibiotics and supportive intravenous fluids.  Further work-up with stool studies was negative for C. difficile.  Due to persistent nausea, GI was consulted. She was placed on antispasmodic agents along with antiemetics  Slowly improving but not yet back to baseline.   Assessment & Plan:   Principal Problem:   Acute cystitis Active Problems:   Hypertension, essential   Hypokalemia   Nausea without vomiting   Chronic diastolic CHF (congestive heart failure) ([name])   Stage 3 chronic kidney disease due to type 2 diabetes mellitus ([name])   GERD (gastroesophageal reflux  disease)   CAD (coronary artery disease)   Intractable vomiting   Diarrhea   Lower abdominal pain   1. Acute cystitis, urinary tract infection, Klebsiella Pneumonia (present on admission) no sepsis. Patient completed antibiotic therapy with good toleration.   Out of bed to chair tid with meals, continue to encourage mobility.   2. Intractable nausea. C diff negative, stool panel negative.   Continue with as needed ondansetron every 4 hrs and scheduled metoclopramide  tid with meals.  Continue to advance diet to regular.   Continue with pantoprazole 40 mg po bid and dicyclomine TID Continue with IV fluids for now.   Add oxycodone as needed for headache.   3. HTN/ CAD/ Diastolic heart failure. On isosorbide and metoprolol for blood pressure control.   On clopidogrel and atorvastatin.    No acute clinical signs of heart failure decompensation.   4. Atrial fibrillation. Continue reate control with metoprolol, anticoagulation with apixaban.  Now off telemetry.   5. CKD stage 3a with hypokalemia and hypomagnesemia. Po intake mild improvement. Advance diet to regular. Check renal function in am, continue with balanced electrolyte solutions.   6. T2DM/ dyslipidemia. Capillary glucose has been 126, 149 and 261. Continue with basal insulin with 10 units glargine, plus insulin sliding scale.   7. Depression. On duloxetine and trazodone.     Status is: Inpatient  Remains inpatient appropriate because:IV treatments appropriate due to intensity of illness or inability to take PO   Dispo: The patient is from: Home              Anticipated d/c is to: SNF              Patient currently is not medically stable to d/c.   Difficult to place  patient No   DVT prophylaxis: Enoxaparin   Code Status:   full  Family Communication:  No family at the bedside      Nutrition Status: Nutrition Problem: Inadequate oral intake Etiology: nausea Signs/Symptoms: per patient/family  report Interventions: Boost Breeze,MVI     Consultants:   GI    Subjective: Patient with mild improvement in nausea, no frank vomiting, but tolerating well soft diet. No chest pain or dyspnea. Positive severe headache frontal with no improving or worsening factors, no radiation.   Objective: Vitals:   07/17/20 0558 07/17/20 1152 07/17/20 1155 07/17/20 1411  BP: 129/65 134/70 134/70 (!) 113/58  Pulse: 79  82 78  Resp:    16  Temp: 98.2 F (36.8 C)   97.8 F (36.6 C)  TempSrc: Oral   Oral  SpO2: 95% 97%  98%  Weight:      Height:        Intake/Output Summary (Last 24 hours) at 07/17/2020 1522 Last data filed at 07/17/2020 1429 Gross per 24 hour  Intake 1601 ml  Output 1500 ml  Net 101 ml   Filed Weights   07/10/20 1434 07/11/20 0943  Weight: 63.8 kg 66.6 kg    Examination:   General: Deconditioned and in pain,.  Neurology: Awake and alert, non focal  E ENT: mild pallor, no icterus, oral mucosa moist Cardiovascular: No JVD. S1-S2 present, rhythmic, no gallops, rubs, or murmurs. No lower extremity edema. Pulmonary: positive breath sounds bilaterally, adequate air movement, no wheezing, rhonchi or rales. Gastrointestinal. Abdomen soft and mild tender to deep palpation. No guarding or rebound.  Skin. No rashes Musculoskeletal: no joint deformities     Data Reviewed: I have personally reviewed following labs and imaging studies  CBC: Recent Labs  Lab 07/10/20 1545 07/11/20 0612 07/13/20 0546 07/13/20 1506 07/15/20 0611 07/16/20 0547  WBC 7.2 6.1 6.6  --  7.1 6.4  NEUTROABS 4.7  --   --   --   --   --   HGB 13.5 10.9* 11.2* 11.2* 11.2* 11.0*  HCT 40.8 33.3* 34.2* 34.5* 34.8* 34.2*  MCV 90.1 91.5 92.4  --  92.6 93.2  PLT 340 278 255  --  254 696   Basic Metabolic Panel: Recent Labs  Lab 07/13/20 0546 07/13/20 1054 07/14/20 0910 07/15/20 0611 07/16/20 0547 07/17/20 0513  NA 137  --  141 142 140 137  K 5.3* 4.9 4.4 4.3 4.4 4.5  CL 108  --  114* 111  109 105  CO2 21*  --  22 26 24 25   GLUCOSE 198*  --  122* 110* 149* 189*  BUN 5*  --  5* 10 11 13   CREATININE 1.01*  --  1.01* 1.23* 1.27* 1.05*  CALCIUM 8.5*  --  8.5* 8.7* 8.6* 8.7*  MG 1.4*  --  1.7 1.6* 1.8  --   PHOS 3.0  --  4.6  --  3.5  --    GFR: Estimated Creatinine Clearance: 40.2 mL/min (A) (by C-G formula based on SCr of 1.05 mg/dL (H)). Liver Function Tests: Recent Labs  Lab 07/10/20 1545 07/11/20 0612  AST 27 19  ALT 17 14  ALKPHOS 84 61  BILITOT 0.6 0.1*  PROT 7.6 5.7*  ALBUMIN 3.5 2.6*   Recent Labs  Lab 07/10/20 1545  LIPASE 32   No results for input(s): AMMONIA in the last 168 hours. Coagulation Profile: No results for input(s): INR, PROTIME in the last 168 hours. Cardiac Enzymes: No results  for input(s): CKTOTAL, CKMB, CKMBINDEX, TROPONINI in the last 168 hours. BNP (last 3 results) No results for input(s): PROBNP in the last 8760 hours. HbA1C: No results for input(s): HGBA1C in the last 72 hours. CBG: Recent Labs  Lab 07/16/20 1220 07/16/20 1730 07/16/20 2120 07/17/20 0809 07/17/20 1228  GLUCAP 165* 69* 126* 149* 261*   Lipid Profile: No results for input(s): CHOL, HDL, LDLCALC, TRIG, CHOLHDL, LDLDIRECT in the last 72 hours. Thyroid Function Tests: Recent Labs    07/15/20 0611  TSH 1.187   Anemia Panel: No results for input(s): VITAMINB12, FOLATE, FERRITIN, TIBC, IRON, RETICCTPCT in the last 72 hours.    Radiology Studies: I have reviewed all of the imaging during this hospital visit personally     Scheduled Meds: . apixaban  5 mg Oral BID  . atorvastatin  40 mg Oral QHS  . clopidogrel  75 mg Oral Daily  . dicyclomine  10 mg Oral TID AC  . DULoxetine  20 mg Oral Daily  . feeding supplement  1 Container Oral TID BM  . gabapentin  300 mg Oral BID  . Gerhardt's butt cream   Topical QID  . insulin aspart  0-15 Units Subcutaneous TID WC  . insulin aspart  0-5 Units Subcutaneous QHS  . insulin glargine  10 Units Subcutaneous  Daily  . isosorbide mononitrate  15 mg Oral Daily  . latanoprost  1 drop Both Eyes QHS  . metoprolol succinate  12.5 mg Oral Daily  . pantoprazole  40 mg Oral BID AC  . vitamin B-12  1,000 mcg Oral Daily   Continuous Infusions: . lactated ringers 125 mL/hr at 07/17/20 0250     LOS: 7 days        Conlin Brahm Gerome Apley, MD

## 2020-07-17 NOTE — Progress Notes (Signed)
Initial Nutrition Assessment  INTERVENTION:   -Boost Breeze po TID, each supplement provides 250 kcal and 9 grams of protein  NUTRITION DIAGNOSIS:   Inadequate oral intake related to nausea as evidenced by per patient/family report.  GOAL:   Patient will meet greater than or equal to 90% of their needs  MONITOR:   PO intake,Supplement acceptance,Labs,Weight trends,I & O's  REASON FOR ASSESSMENT:   Consult Assessment of nutrition requirement/status  ASSESSMENT:   75 year old female with the past medical history for coronary artery disease, history of CVA, DVT, atrial fibrillation, diastolic heart failure, type 2 diabetes mellitus, hypertension, dyslipidemia and autoimmune syndrome who presented with dysuria and persistent nausea. Admitted to the hospital with working diagnosis of acute cystitis, complicated by intractable nausea and vomiting.   5/2: admitted  Patient reporting nausea PTA resulting in poor appetite and PO. Pt has since developed abdominal pain and diarrhea which are starting to improve.   Currently consuming 80-100% of meals.  Will order Boost Breeze supplements for additional kcals and protein.  Per weight records, weights have been stable.  Medications: Reglan, Vitamin B-12, Lactated ringers  Labs reviewed: CBGs: 69-149   NUTRITION - FOCUSED PHYSICAL EXAM:  Deferred.  Diet Order:   Diet Order            DIET SOFT Room service appropriate? Yes; Fluid consistency: Thin  Diet effective now                 EDUCATION NEEDS:   No education needs have been identified at this time  Skin:  Skin Assessment: Reviewed RN Assessment  Last BM:  5/8 -type 7  Height:   Ht Readings from Last 1 Encounters:  07/11/20 5\' 2"  (1.575 m)    Weight:   Wt Readings from Last 1 Encounters:  07/11/20 66.6 kg   BMI:  Body mass index is 26.85 kg/m.  Estimated Nutritional Needs:   Kcal:  1600-1800  Protein:  70-85g  Fluid:  1.8L/day  Clayton Bibles, MS, RD, LDN Inpatient Clinical Dietitian Contact information available via Amion

## 2020-07-17 NOTE — Care Management Important Message (Signed)
Medicare IM printed remotely for Social Work team to give to the patient. 

## 2020-07-17 NOTE — Evaluation (Signed)
Physical Therapy Evaluation Patient Details Name: Maria Fox MRN: [id number] DOB: 23-Jan-1946 Today's Date: 07/17/2020   History of Present Illness  75 yo female admittred with acute cystitis, dysuria, nausea, N/V/D. Hx of CAD, CVA, DVT, MDD, Afib, HF, DM, COVID  Clinical Impression  On eval, pt was Min guard-Min A for mobility. She walked ~150 feet with use of a rollator to steady. Pt tolerated activity well. O2 95% on RA with activity but pt requested to wear [place] O2 for comfort once back in room. Will plan to follow and progress activity as tolerated.     Follow Up Recommendations Home health PT;Supervision - Intermittent    Equipment Recommendations  None recommended by PT    Recommendations for Other Services       Precautions / Restrictions Precautions Precautions: Fall Precaution Comments: incontinent Restrictions Weight Bearing Restrictions: No      Mobility  Bed Mobility Overal bed mobility: Modified Independent             General bed mobility comments: Sit to supine, scooting laterally along bed: Mod I.    Transfers Overall transfer level: Needs assistance Equipment used: 1 person hand held assist Transfers: Sit to/from [facility] Sit to Stand: Min assist Stand pivot transfers: Min assist       General transfer comment: Min assist to steady.  Ambulation/Gait Ambulation/Gait assistance: Min guard Gait Distance (Feet): 150 Feet Assistive device: 4-wheeled walker Gait Pattern/deviations: Step-through pattern;Decreased stride length     General Gait Details: Min guard for safety. Good gait speed. No LOB with RW use. O2 95% on RA  Stairs            Wheelchair Mobility    Modified Rankin (Stroke Patients Only)       Balance Overall balance assessment: Needs assistance Sitting-balance support: Feet supported Sitting balance-Leahy Scale: Good       Standing balance-Leahy Scale: Fair Standing balance comment: Fair  static, poor dynamic with LOB and reaching for furniture or therapist's hand to steady herself.                             Pertinent Vitals/Pain Pain Assessment: Faces Pain Score: 8  Faces Pain Scale: Hurts even more Pain Location: head and stomach ache Pain Descriptors / Indicators: Aching Pain Intervention(s): RN gave pain meds during session    Home Living Family/patient expects to be discharged to:: Private residence Living Arrangements: Non-relatives/Friends [name]" been together for 30+ years) Available Help at Discharge: Friend(s);Available 24 hours/day Type of Home: House Home Access: Level entry     Home Layout: One level Home Equipment: Shower seat;Hand held shower head;Bedside commode;Cane - quad;Walker - standard;Grab bars - tub/shower      Prior Function Level of Independence: Independent with assistive device(s)   Gait / Transfers Assistance Needed: uses cane PRN  ADL's / Homemaking Assistance Needed: Pt reports completing own BADLs independently and [name] completes most IADLs. Pt reports that they often go out to eat. [name] provides transportation as pt does not drive. Pt receives a little assistance with setup of her meds.        Hand Dominance   Dominant Hand: Left    Extremity/Trunk Assessment   Upper Extremity Assessment Upper Extremity Assessment: Defer to OT evaluation    Lower Extremity Assessment Lower Extremity Assessment: Generalized weakness    Cervical / Trunk Assessment Cervical / Trunk Assessment: Normal  Communication   Communication: No difficulties  Cognition Arousal/Alertness: Awake/alert Behavior During Therapy: WFL for tasks assessed/performed Overall Cognitive Status: Within Functional Limits for tasks assessed                                        General Comments      Exercises     Assessment/Plan    PT Assessment Patient needs continued PT services  PT Problem List Decreased  mobility;Decreased balance       PT Treatment Interventions DME instruction;Gait training;Balance training;Therapeutic exercise;Functional mobility training;Therapeutic activities;Patient/family education    PT Goals (Current goals can be found in the Care Plan section)  Acute Rehab PT Goals Patient Stated Goal: feel better PT Goal Formulation: With patient Time For Goal Achievement: 07/31/20 Potential to Achieve Goals: Good    Frequency Min 3X/week   Barriers to discharge        Co-evaluation               AM-PAC PT "6 Clicks" Mobility  Outcome Measure Help needed turning from your back to your side while in a flat bed without using bedrails?: None Help needed moving from lying on your back to sitting on the side of a flat bed without using bedrails?: None Help needed moving to and from a bed to a chair (including a wheelchair)?: A Little Help needed standing up from a chair using your arms (e.g., wheelchair or bedside chair)?: A Little Help needed to walk in hospital room?: A Little Help needed climbing 3-5 steps with a railing? : A Little 6 Click Score: 20    End of Session   Activity Tolerance: Patient tolerated treatment well Patient left: in chair;with call bell/phone within reach;with chair alarm set   PT Visit Diagnosis: Unsteadiness on feet (R26.81)    Time: 0165-5374 PT Time Calculation (min) (ACUTE ONLY): 9 min   Charges:   PT Evaluation $PT Eval Moderate Complexity: 1 Mod            Doreatha Massed, PT Acute Rehabilitation  Office: [phone] Pager: [phone]

## 2020-07-17 NOTE — Progress Notes (Addendum)
[facility] Progress Note  CC:  Nausea, abdominal pain, diarrhea  Subjective: She reports feeling better today. Less lower abdominal pain and less diarrhea. She passed small amounts of diarrhea when she urinated x 2 last night. She urinated x 2 since then without further diarrhea. She felt a little SOB last night which resolved after she was placed on oxygen 2L [place]. No current SOB. No chest pain. No family at the bedside.   Objective:   CTAP WO contrast 07/10/2020: Diverticulosis without diverticulitis.  Changes of prior granulomatous disease.  Chronic thickening of the pericardium. This is stable dating back to 2016 and likely related to prior cardiac surgery.  Vital signs in last 24 hours: Temp:  [98 F (36.7 C)-98.2 F (36.8 C)] 98.2 F (36.8 C) (05/09 0558) Pulse Rate:  [79-82] 79 (05/09 0558) Resp:  [15-16] 16 (05/08 2117) BP: (129-140)/(65-86) 129/65 (05/09 0558) SpO2:  [95 %-97 %] 95 % (05/09 0558) Last BM Date: 07/15/20 General:   Alert fatigued appearing in NAD.  Heart: RRR, no murmur.  Pulm:  Breath sounds with few crackles LLL otherwise clear.  Abdomen: Soft, nondistended. Mild central and LLQ tenderness without rebound or guarding. + BS x 4 quads. No HSM.  Extremities:  Without edema. Neurologic:  Alert and  oriented x4;  grossly normal neurologically. Psych:  Alert and cooperative. Normal mood and affect.  Intake/Output from previous day: 05/08 0701 - 05/09 0700 In: 2494.7 [P.O.:876; I.V.:1618.7] Out: 1400 [Urine:1400] Intake/Output this shift: Total I/O In: -  Out: 300 [Urine:300]  Lab Results: Recent Labs    07/15/20 0611 07/16/20 0547  WBC 7.1 6.4  HGB 11.2* 11.0*  HCT 34.8* 34.2*  PLT 254 266   BMET Recent Labs    07/15/20 0611 07/16/20 0547 07/17/20 0513  NA 142 140 137  K 4.3 4.4 4.5  CL 111 109 105  CO2 26 24 25   GLUCOSE 110* 149* 189*  BUN 10 11 13   CREATININE 1.23* 1.27* 1.05*  CALCIUM 8.7* 8.6* 8.7*    LFT No results for input(s): PROT, ALBUMIN, AST, ALT, ALKPHOS, BILITOT, BILIDIR, IBILI in the last 72 hours. PT/INR No results for input(s): LABPROT, INR in the last 72 hours. Hepatitis Panel No results for input(s): HEPBSAG, HCVAB, HEPAIGM, HEPBIGM in the last 72 hours.  No results found.  Assessment / Plan:  31. 75 year old female with nausea, lower abdominal pain and diarrhea admitted to the hospital with cystitis. CTAP 5/2 showed diverticulosis without evidence of diverticulitis or colitis.  C. Diff toxin and antigen levels negative. GI pathogen panel negative. She is having less abdominal pain and diarrhea. No current nausea. Tolerating a soft diet.  -Continue Dicyclomine 10mg  po tid -Continue Reglan  -IVFs per the hospitalist  -Soft diet as tolerated  -Further recommendations per Dr. Silverio Decamp   2. History of atrial fibrillation/flutter, DVT/PE on Eliquis  3. History of CAD of Plavix   4. Elevated creatinine, improving. Cr 1.27 -> 1.05.   5. Normocytic anemia. Hg 11.2 -> 11.0. MCV 93.2. No overt GI bleeding.   6. Splenic granulomas per CTAP  7. DM II  Principal Problem:   Acute cystitis Active Problems:   Hypertension, essential   Hypokalemia   Nausea without vomiting   Chronic diastolic CHF (congestive heart failure) ([name])   Stage 3 chronic kidney disease due to type 2 diabetes mellitus ([name])   GERD (gastroesophageal reflux disease)   CAD (coronary artery disease)   Intractable vomiting   Diarrhea  Lower abdominal pain     LOS: 7 days   Noralyn Pick  07/17/2020, 8:41 AM   Attending physician's note   I have taken an interval history, reviewed the chart and examined the patient. I agree with the Advanced Practitioner's note, impression and recommendations.   75 year old female with multiple comorbidities on antiplatelet therapy with Plavix coagulation with Eliquis and  Diabetes  She is feeling better but continues to have lower abdominal  pain UA suggestive of UTI, currently not on antibiotics  GI pathogen panel and C. difficile negative CT abdomen pelvis with no acute pathology  Continue supportive care Use Zofran as needed We will stop Reglan  No plan for endoscopic evaluation, reviewed recent EGD and colonoscopy from January 2021 (colon biopsies negative for microscopic colitis) She had only 1 bowel movement today with incontinence Use Imodium as needed  Advance diet as tolerated  GI will continue to follow along   I have spent 25 minutes of patient care (this includes precharting, chart review, review of results, face-to-face time used for counseling as well as treatment plan and follow-up. The patient was provided an opportunity to ask questions and all were answered. The patient agreed with the plan and demonstrated an understanding of the instructions.  Damaris Hippo , MD [phone]

## 2020-07-18 ENCOUNTER — Inpatient Hospital Stay (HOSPITAL_COMMUNITY): Payer: Medicare Other

## 2020-07-18 DIAGNOSIS — I10 Essential (primary) hypertension: Secondary | ICD-10-CM | POA: Diagnosis not present

## 2020-07-18 DIAGNOSIS — K219 Gastro-esophageal reflux disease without esophagitis: Secondary | ICD-10-CM

## 2020-07-18 DIAGNOSIS — N3000 Acute cystitis without hematuria: Secondary | ICD-10-CM | POA: Diagnosis not present

## 2020-07-18 DIAGNOSIS — I2510 Atherosclerotic heart disease of native coronary artery without angina pectoris: Secondary | ICD-10-CM | POA: Diagnosis not present

## 2020-07-18 DIAGNOSIS — R197 Diarrhea, unspecified: Secondary | ICD-10-CM | POA: Diagnosis not present

## 2020-07-18 DIAGNOSIS — N390 Urinary tract infection, site not specified: Secondary | ICD-10-CM

## 2020-07-18 DIAGNOSIS — K591 Functional diarrhea: Secondary | ICD-10-CM

## 2020-07-18 LAB — CBC
HCT: 32.3 % — ABNORMAL LOW (ref 36.0–46.0)
Hemoglobin: 10.4 g/dL — ABNORMAL LOW (ref 12.0–15.0)
MCH: 30.1 pg (ref 26.0–34.0)
MCHC: 32.2 g/dL (ref 30.0–36.0)
MCV: 93.4 fL (ref 80.0–100.0)
Platelets: 253 10*3/uL (ref 150–400)
RBC: 3.46 MIL/uL — ABNORMAL LOW (ref 3.87–5.11)
RDW: 14 % (ref 11.5–15.5)
WBC: 6.2 10*3/uL (ref 4.0–10.5)
nRBC: 0 % (ref 0.0–0.2)

## 2020-07-18 LAB — GLUCOSE, CAPILLARY
Glucose-Capillary: 194 mg/dL — ABNORMAL HIGH (ref 70–99)
Glucose-Capillary: 150 mg/dL — ABNORMAL HIGH (ref 70–99)
Glucose-Capillary: 254 mg/dL — ABNORMAL HIGH (ref 70–99)
Glucose-Capillary: 151 mg/dL — ABNORMAL HIGH (ref 70–99)
Glucose-Capillary: 170 mg/dL — ABNORMAL HIGH (ref 70–99)

## 2020-07-18 LAB — BASIC METABOLIC PANEL
Anion gap: 3 — ABNORMAL LOW (ref 5–15)
BUN: 11 mg/dL (ref 8–23)
CO2: 30 mmol/L (ref 22–32)
Calcium: 8.9 mg/dL (ref 8.9–10.3)
Chloride: 104 mmol/L (ref 98–111)
Creatinine, Ser: 1.09 mg/dL — ABNORMAL HIGH (ref 0.44–1.00)
GFR, Estimated: 52 mL/min — ABNORMAL LOW (ref >60)
Glucose, Bld: 161 mg/dL — ABNORMAL HIGH (ref 70–99)
Potassium: 4.6 mmol/L (ref 3.5–5.1)
Sodium: 137 mmol/L (ref 135–145)

## 2020-07-18 LAB — MAGNESIUM: Magnesium: 1.7 mg/dL (ref 1.7–2.4)

## 2020-07-18 LAB — C-REACTIVE PROTEIN: CRP: 1 mg/dL — ABNORMAL HIGH (ref <1.0)

## 2020-07-18 MED ORDER — ACETAMINOPHEN 500 MG PO TABS
500.0000 mg | ORAL_TABLET | ORAL | Status: DC | PRN
  Administered 2020-07-19 – 2020-07-24 (×4): 500 mg via ORAL
  Filled 2020-07-18 (×4): qty 1

## 2020-07-18 MED ORDER — HYDROMORPHONE HCL 1 MG/ML IJ SOLN
1.0000 mg | INTRAMUSCULAR | Status: DC | PRN
  Administered 2020-07-18 – 2020-07-26 (×7): 1 mg via INTRAVENOUS
  Filled 2020-07-18 (×7): qty 1

## 2020-07-18 MED ORDER — MAGNESIUM SULFATE 2 GM/50ML IV SOLN
2.0000 g | Freq: Once | INTRAVENOUS | Status: AC
  Administered 2020-07-18: 2 g via INTRAVENOUS
  Filled 2020-07-18: qty 50

## 2020-07-18 MED ORDER — LIP MEDEX EX OINT
TOPICAL_OINTMENT | CUTANEOUS | Status: AC
  Filled 2020-07-18: qty 7

## 2020-07-18 NOTE — Progress Notes (Signed)
PROGRESS NOTE    Maria Fox  SWN:[id number] DOB: 05/28/1945 DOA: 07/10/2020 PCP: [name], MD    Brief Narrative:  Maria Fox was admitted to the hospital with working diagnosis of acute cystitis, complicated by intractable nausea and vomiting.  75 year old femalewith thepast medical history for coronary artery disease, history of CVA, DVT, atrial fibrillation, diastolic heart failure, type 2 diabetes mellitus, hypertension, dyslipidemia and autoimmune syndrome who presented with dysuria and persistent nausea.She reported colicky suprapubic and bilateral flanks pain,associated with persistent nausea. On her initial physical examination temperature 97.7, blood pressure 167/96, heart rate 91, respiratory rate 24, oxygen saturation 92% on room air. She had dry mucous membranes, lungs clear to auscultation bilaterally, heart S1-S2, present, rhythmic, abdomen tender at the suprapubic region, no lower extremity edema.  Sodium 136, potassium 3.3, chloride 104, bicarb 23, glucose 239, BUN 13, creatinine 1.0, white count 7.2, hemoglobin 13.5, hematocrit 40.8, platelet count 340. SARS COVID-19 negative.  Urinalysis specific gravity 1.023, 30 protein, 21-50 red cells,>50 white cells. CT abdomen pelvis with diverticulosis, no diverticulitis, no renal calculi or obstruction.  Patient was placed on intravenous antibiotics and supportive intravenous fluids. Further work-up with stool studies was negative for C. difficile. Due to persistent nausea, GI was consulted. She was placed on antispasmodic agents along with antiemetics  Slowly improving but not yet back to baseline.    Assessment & Plan:   Principal Problem:   Acute cystitis Active Problems:   Hypertension, essential   Hypokalemia   Nausea without vomiting   Chronic diastolic CHF (congestive heart failure) ([name])   Stage 3 chronic kidney disease due to type 2 diabetes mellitus ([name])   GERD (gastroesophageal reflux  disease)   CAD (coronary artery disease)   Intractable vomiting   Diarrhea   Lower abdominal pain   1. Acute cystitis, urinary tract infection, Klebsiella Pneumonia (present on admission) no sepsis. Patient has completed her antibiotic therapy.   2. Intractable nausea. C diff negative, stool panel negative.   Abdominal film personally reviewed with no signs of obstruction.   Patient with persistent head ache and abdominal pain, positive nausea but not frank vomiting. Add IV hydromorphone for pain control, continue with oral oxycodone for headache and mild pain control. As needed ondansetron every 4 hrs and scheduled metoclopramide  tid with meals.  Will try regular diet today.   Antiacid therapy with  pantoprazole 40 mg po bid and continue antiespasmodic with dicyclomine TID Continue supportive IV fluids.   3. HTN/ CAD/ Diastolic heart failure. Continue with isosorbide and metoprolol for blood pressure control.   Continue with clopidogrel and atorvastatin.  No current acute clinical signs of heart failure decompensation.  4. Atrial fibrillation. Rate control with metoprolol, and anticoagulation with apixaban. Telemetry has been discontinued.   5. CKD stage 3a with hypokalemia and hypomagnesemia.  Renal function with serum cr at 1,0 with K at 4,6 and serum bicarbonate at 30. Continue hydration with balanced electrolyte solutions.   6. T2DM/ dyslipidemia. uncontrolled hyperglycemia, capillary glucose 256, fasting glucose 161. Continue with sliding scale insulin for glucose control and basal insulin 10 units. Hold on oral antihyperglycemic agents for now, until better po intake. .   Continue with statin therapy.   7. Depression. Continue with duloxetine and trazodone.   Status is: Inpatient  Remains inpatient appropriate because:IV treatments appropriate due to intensity of illness or inability to take PO   Dispo: The patient is from: Home  Anticipated d/c is to: Home              Patient currently is not medically stable to d/c.   Difficult to place patient No  DVT prophylaxis: Enoxaparin   Code Status:   full Family Communication:  No family at the bedside      Nutrition Status: Nutrition Problem: Inadequate oral intake Etiology: nausea Signs/Symptoms: per patient/family report Interventions: Boost Breeze,MVI    Consultants:   GI    Subjective: Patient continue to have headache and today abdominal pain, no nausea or vomiting, analgesics help in the short term, her diet has been advanced to regular.   Objective: Vitals:   07/17/20 1155 07/17/20 1411 07/17/20 2221 07/18/20 0608  BP: 134/70 (!) 113/58 (!) 116/59 (!) 100/58  Pulse: 82 78 74 70  Resp:  16 17 17   Temp:  97.8 F (36.6 C) 98.2 F (36.8 C) 98.3 F (36.8 C)  TempSrc:  Oral Oral Oral  SpO2:  98% 94% 96%  Weight:      Height:        Intake/Output Summary (Last 24 hours) at 07/18/2020 1221 Last data filed at 07/18/2020 1100 Gross per 24 hour  Intake 1200 ml  Output 400 ml  Net 800 ml   Filed Weights   07/10/20 1434 07/11/20 0943  Weight: 63.8 kg 66.6 kg    Examination:   General: Not in pain or dyspnea, deconditioned  Neurology: Awake and alert, non focal  E ENT: mild pallor, no icterus, oral mucosa moist Cardiovascular: No JVD. S1-S2 present, rhythmic, no gallops, rubs, or murmurs. No lower extremity edema. Pulmonary: Positive breath sounds bilaterally, adequate air movement, no wheezing, rhonchi or rales. Gastrointestinal. Abdomen soft and non distended, tender to deep palpation.  Skin. No rashes Musculoskeletal: no joint deformities     Data Reviewed: I have personally reviewed following labs and imaging studies  CBC: Recent Labs  Lab 07/13/20 0546 07/13/20 1506 07/15/20 0611 07/16/20 0547 07/18/20 1138  WBC 6.6  --  7.1 6.4 6.2  HGB 11.2* 11.2* 11.2* 11.0* 10.4*  HCT 34.2* 34.5* 34.8* 34.2* 32.3*  MCV 92.4  --  92.6  93.2 93.4  PLT 255  --  254 266 144   Basic Metabolic Panel: Recent Labs  Lab 07/13/20 0546 07/13/20 1054 07/14/20 0910 07/15/20 0611 07/16/20 0547 07/17/20 0513 07/18/20 0550  NA 137  --  141 142 140 137 137  K 5.3*   < > 4.4 4.3 4.4 4.5 4.6  CL 108  --  114* 111 109 105 104  CO2 21*  --  22 26 24 25 30   GLUCOSE 198*  --  122* 110* 149* 189* 161*  BUN 5*  --  5* 10 11 13 11   CREATININE 1.01*  --  1.01* 1.23* 1.27* 1.05* 1.09*  CALCIUM 8.5*  --  8.5* 8.7* 8.6* 8.7* 8.9  MG 1.4*  --  1.7 1.6* 1.8  --  1.7  PHOS 3.0  --  4.6  --  3.5  --   --    < > = values in this interval not displayed.   GFR: Estimated Creatinine Clearance: 38.7 mL/min (A) (by C-G formula based on SCr of 1.09 mg/dL (H)). Liver Function Tests: No results for input(s): AST, ALT, ALKPHOS, BILITOT, PROT, ALBUMIN in the last 168 hours. No results for input(s): LIPASE, AMYLASE in the last 168 hours. No results for input(s): AMMONIA in the last 168 hours. Coagulation Profile: No results for input(s): INR,  PROTIME in the last 168 hours. Cardiac Enzymes: No results for input(s): CKTOTAL, CKMB, CKMBINDEX, TROPONINI in the last 168 hours. BNP (last 3 results) No results for input(s): PROBNP in the last 8760 hours. HbA1C: No results for input(s): HGBA1C in the last 72 hours. CBG: Recent Labs  Lab 07/17/20 1228 07/17/20 1712 07/17/20 2218 07/18/20 0838 07/18/20 1147  GLUCAP 261* 114* 194* 150* 254*   Lipid Profile: No results for input(s): CHOL, HDL, LDLCALC, TRIG, CHOLHDL, LDLDIRECT in the last 72 hours. Thyroid Function Tests: No results for input(s): TSH, T4TOTAL, FREET4, T3FREE, THYROIDAB in the last 72 hours. Anemia Panel: No results for input(s): VITAMINB12, FOLATE, FERRITIN, TIBC, IRON, RETICCTPCT in the last 72 hours.    Radiology Studies: I have reviewed all of the imaging during this hospital visit personally     Scheduled Meds: . apixaban  5 mg Oral BID  . atorvastatin  40 mg Oral QHS   . clopidogrel  75 mg Oral Daily  . dicyclomine  10 mg Oral TID AC  . DULoxetine  20 mg Oral Daily  . feeding supplement  1 Container Oral TID BM  . gabapentin  300 mg Oral BID  . Gerhardt's butt cream   Topical QID  . insulin aspart  0-15 Units Subcutaneous TID WC  . insulin aspart  0-5 Units Subcutaneous QHS  . insulin glargine  10 Units Subcutaneous Daily  . isosorbide mononitrate  15 mg Oral Daily  . latanoprost  1 drop Both Eyes QHS  . metoprolol succinate  12.5 mg Oral Daily  . pantoprazole  40 mg Oral BID AC  . vitamin B-12  1,000 mcg Oral Daily   Continuous Infusions: . lactated ringers 125 mL/hr at 07/17/20 0250  . magnesium sulfate bolus IVPB       LOS: 8 days        Burnis Halling Gerome Apley, MD

## 2020-07-18 NOTE — Progress Notes (Addendum)
[facility] Progress Note  CC:  Nausea, abdominal pain, diarrhea  Subjective: She complains of having a headache since admission. She complains of having a constant LLQ pain since this morning which she stated is a new pain. Some nausea. No vomiting. She reported passing a normal soft BM last night. She ate cereal for breakfast. She is drinking juice and water as confirmed by her RN. No family at the bedside.    Objective:   CTAP WO contrast 07/10/2020: Diverticulosis without diverticulitis. Changes of prior granulomatous disease. Chronic thickening of the pericardium. This is stable dating back to 2016 and likely related to prior cardiac surgery.  Vital signs in last 24 hours: Temp:  [97.8 F (36.6 C)-98.3 F (36.8 C)] 98.3 F (36.8 C) (05/10 6295) Pulse Rate:  [70-82] 70 (05/10 0608) Resp:  [16-17] 17 (05/10 0608) BP: (100-134)/(58-70) 100/58 (05/10 0608) SpO2:  [94 %-98 %] 96 % (05/10 2841) Last BM Date: 07/17/20   General:  Alert, fatigued appearing female in NAD.  Heart: RRR, no murmur.  Pulm:  Breath sounds clear throughout.  Abdomen: Soft, nondistended. Moderate LLQ tenderness without rebound or guarding. Hyperactive bowel sounds, some high pitched bowel sounds.  Extremities:  Without edema. Neurologic:  Alert and  oriented x 4. Moves all extremities. Speech is clear.  Psych:  Alert and cooperative. Normal mood and affect.  Intake/Output from previous day: 05/09 0701 - 05/10 0700 In: 1200 [P.O.:1200] Out: 900 [Urine:900] Intake/Output this shift: No intake/output data recorded.  Lab Results: Recent Labs    07/16/20 0547  WBC 6.4  HGB 11.0*  HCT 34.2*  PLT 266   BMET Recent Labs    07/16/20 0547 07/17/20 0513 07/18/20 0550  NA 140 137 137  K 4.4 4.5 4.6  CL 109 105 104  CO2 24 25 30   GLUCOSE 149* 189* 161*  BUN 11 13 11   CREATININE 1.27* 1.05* 1.09*  CALCIUM 8.6* 8.7* 8.9   LFT No results for input(s): PROT, ALBUMIN, AST,  ALT, ALKPHOS, BILITOT, BILIDIR, IBILI in the last 72 hours. PT/INR No results for input(s): LABPROT, INR in the last 72 hours. Hepatitis Panel No results for input(s): HEPBSAG, HCVAB, HEPAIGM, HEPBIGM in the last 72 hours.  No results found.  Assessment / Plan:  71. 75 year old female with nausea, lower abdominal pain and diarrhea admitted to the hospital with cystitis. CTAP 5/2 showed diverticulosis without evidence of diverticulitis or colitis.  C. Diff toxin and antigen levels negative. GI pathogen panel negative. She reports having LLQ pain since this morning, stated this is a new pain. Some nausea, no vomiting. Past a normal BM last night.  -CBC, CRP -Portable Abd xray 1 view now -If abdominal pain worsens may need repeat CTAP -Continue Dicyclomine 10mg  po tid -Continue Reglan  -IVFs per the hospitalist  -Soft diet as tolerated  -Further recommendations per Dr. Silverio Decamp   2. History of atrial fibrillation/flutter, DVT/PE on Eliquis  3. History of CAD of Plavix   4. Elevated creatinine, improving. Cr 1.09.   5. Normocytic anemia. Hg 11.2 -> 11.0 on 5/8. No overt GI bleeding.   6. Splenic granulomas per CTAP  7. DM II  Principal Problem:   Acute cystitis Active Problems:   Hypertension, essential   Hypokalemia   Nausea without vomiting   Chronic diastolic CHF (congestive heart failure) ([name])   Stage 3 chronic kidney disease due to type 2 diabetes mellitus ([name])   GERD (gastroesophageal reflux disease)   CAD (  coronary artery disease)   Intractable vomiting   Diarrhea   Lower abdominal pain     LOS: 8 days   Noralyn Pick  07/18/2020, 10:54 AM    Attending physician's note   I have taken an interval history, reviewed the chart and examined the patient. I agree with the Advanced Practitioner's note, impression and recommendations.   No longer having vomiting or diarrhea. No rectal bleeding.  Advance diet as tolerated Supportive care and pain  management per hospitalist team  GI will sign off, available if have any questions  The patient was provided an opportunity to ask questions and all were answered. The patient agreed with the plan and demonstrated an understanding of the instructions.  Damaris Hippo , MD [phone]

## 2020-07-19 ENCOUNTER — Inpatient Hospital Stay: Payer: Medicare Other | Admitting: Family Medicine

## 2020-07-19 DIAGNOSIS — I2510 Atherosclerotic heart disease of native coronary artery without angina pectoris: Secondary | ICD-10-CM | POA: Diagnosis not present

## 2020-07-19 DIAGNOSIS — K591 Functional diarrhea: Secondary | ICD-10-CM | POA: Diagnosis not present

## 2020-07-19 DIAGNOSIS — N3000 Acute cystitis without hematuria: Secondary | ICD-10-CM | POA: Diagnosis not present

## 2020-07-19 DIAGNOSIS — I5032 Chronic diastolic (congestive) heart failure: Secondary | ICD-10-CM | POA: Diagnosis not present

## 2020-07-19 LAB — BASIC METABOLIC PANEL
Anion gap: 6 (ref 5–15)
BUN: 12 mg/dL (ref 8–23)
CO2: 26 mmol/L (ref 22–32)
Calcium: 8.8 mg/dL — ABNORMAL LOW (ref 8.9–10.3)
Chloride: 101 mmol/L (ref 98–111)
Creatinine, Ser: 1.03 mg/dL — ABNORMAL HIGH (ref 0.44–1.00)
GFR, Estimated: 56 mL/min — ABNORMAL LOW (ref >60)
Glucose, Bld: 240 mg/dL — ABNORMAL HIGH (ref 70–99)
Potassium: 5.1 mmol/L (ref 3.5–5.1)
Sodium: 133 mmol/L — ABNORMAL LOW (ref 135–145)

## 2020-07-19 LAB — GLUCOSE, CAPILLARY
Glucose-Capillary: 210 mg/dL — ABNORMAL HIGH (ref 70–99)
Glucose-Capillary: 169 mg/dL — ABNORMAL HIGH (ref 70–99)
Glucose-Capillary: 212 mg/dL — ABNORMAL HIGH (ref 70–99)

## 2020-07-19 MED ORDER — OXYCODONE HCL ER 10 MG PO T12A
10.0000 mg | EXTENDED_RELEASE_TABLET | Freq: Two times a day (BID) | ORAL | Status: DC
Start: 2020-07-19 — End: 2020-07-27
  Administered 2020-07-19 – 2020-07-27 (×17): 10 mg via ORAL
  Filled 2020-07-19 (×17): qty 1

## 2020-07-19 MED ORDER — SODIUM CHLORIDE 0.9 % IV SOLN: INTRAVENOUS | Status: DC

## 2020-07-19 MED ORDER — POLYETHYLENE GLYCOL 3350 17 G PO PACK
17.0000 g | PACK | Freq: Two times a day (BID) | ORAL | Status: DC
  Administered 2020-07-19 – 2020-07-25 (×9): 17 g via ORAL
  Filled 2020-07-19 (×12): qty 1

## 2020-07-19 NOTE — Progress Notes (Signed)
Physical Therapy Treatment Patient Details Name: Maria Fox MRN: [id number] DOB: Mar 16, 1945 Today's Date: 07/19/2020    History of Present Illness 75 yo female admittred with acute cystitis, dysuria, nausea, N/V/D. Hx of CAD, CVA, DVT, MDD, Afib, HF, DM, COVID    PT Comments    Pt initially declined participation this a.m. but she eventually agreed to move around the room a bit. She mobilized well but she reports she doesn't feel well. Will continue to follow.   Follow Up Recommendations  Home health PT;Supervision - Intermittent     Equipment Recommendations  None recommended by PT    Recommendations for Other Services       Precautions / Restrictions Precautions Precautions: Fall Restrictions Weight Bearing Restrictions: No    Mobility  Bed Mobility Overal bed mobility: Modified Independent                  Transfers     Transfers: Sit to/from Stand Sit to Stand: Supervision            Ambulation/Gait Ambulation/Gait assistance: Min guard Gait Distance (Feet): 15 Feet (x2)   Gait Pattern/deviations: Step-through pattern;Decreased stride length     General Gait Details: Min guard for safety. Pt only agreeable to walking in room only 2* not feeling well.   Stairs             Wheelchair Mobility    Modified Rankin (Stroke Patients Only)       Balance Overall balance assessment: Mild deficits observed, not formally tested                                          Cognition Arousal/Alertness: Awake/alert Behavior During Therapy: WFL for tasks assessed/performed Overall Cognitive Status: Within Functional Limits for tasks assessed                                        Exercises      General Comments        Pertinent Vitals/Pain Pain Assessment: Faces Faces Pain Scale: Hurts even more Pain Location: head and stomach ache Pain Intervention(s): Limited activity within patient's  tolerance;Monitored during session    Home Living                      Prior Function            PT Goals (current goals can now be found in the care plan section) Progress towards PT goals: Progressing toward goals    Frequency    Min 3X/week      PT Plan Current plan remains appropriate    Co-evaluation              AM-PAC PT "6 Clicks" Mobility   Outcome Measure    Help needed moving from lying on your back to sitting on the side of a flat bed without using bedrails?: None Help needed moving to and from a bed to a chair (including a wheelchair)?: None Help needed standing up from a chair using your arms (e.g., wheelchair or bedside chair)?: None Help needed to walk in hospital room?: A Little Help needed climbing 3-5 steps with a railing? : A Little 6 Click Score: 18    End of Session   Activity Tolerance: Patient  tolerated treatment well Patient left: in bed;with call bell/phone within reach   PT Visit Diagnosis: Muscle weakness (generalized) (M62.81);Unsteadiness on feet (R26.81)     Time: 3300-7622 PT Time Calculation (min) (ACUTE ONLY): 8 min  Charges:  $Gait Training: 8-22 mins              Doreatha Massed, PT Acute Rehabilitation  Office: [phone] Pager: [phone]

## 2020-07-19 NOTE — Progress Notes (Signed)
Inpatient Diabetes Program Recommendations  AACE/ADA: New Consensus Statement on Inpatient Glycemic Control (2015)  Target Ranges:  Prepandial:   less than 140 mg/dL      Peak postprandial:   less than 180 mg/dL (1-2 hours)      Critically ill patients:  140 - 180 mg/dL   Lab Results  Component Value Date   GLUCAP 210 (H) 07/19/2020   HGBA1C 11.6 (H) 07/03/2020    Review of Glycemic Control Results for Maria Fox, Maria Fox (MRN [id number]) as of 07/19/2020 11:18  Ref. Range 07/18/2020 08:38 07/18/2020 11:47 07/18/2020 16:43 07/18/2020 22:00 07/19/2020 07:24  Glucose-Capillary Latest Ref Range: 70 - 99 mg/dL 150 (H) 254 (H) 151 (H) 170 (H) 210 (H)   Diabetes history:  DM2 Outpatient Diabetes medications:  Lantus 10 units daily Glipizide 5 mg BID Farxiga 5 mg QD Current orders for Inpatient glycemic control:  Lantus 10 units daily Novolog 0-15 units TID and 0-5 units QHS  Spoke with Ms. Matuska at bedside about her home diabetes regimen.  She states she takes [place], Glipizide and Lantus 2 units.  When I asked her about the 2 units she said, "yes I do 2 clicks" I told her she was discharged last hospitalization on 10 units.  She then said, "yes, 10 units is what I take.  I do 2 clicks".  If that is truly the case then she is only administering 2 units QD which could explain her elevated A1C of 11.6% on 07/03/20.  Explained she has been getting Lantus 10 units here and her overall CBG's look good.  Fasting's have been within goal with the exeption of 210 mg/dL this morning.  She states she will take a closer look when she gets home and speak with her pharmacist or nurse who helps her with her medications.  She has been checking her blood sugar daily and she says it has been elevated.    She like to apple juice and cranberry juice.  Discussed importance of cutting with water or switching to diet as regular juices will increase her blood sugar.  No episodes of hypoglycemia.  She is aware of signs and  symptoms and treatment.    Will continue to follow while inpatient.  Thank you, Reche Dixon, RN, BSN Diabetes Coordinator Inpatient Diabetes Program [phone] (team pager from 8a-5p)

## 2020-07-19 NOTE — Progress Notes (Signed)
PROGRESS NOTE    Maria Fox  DVV:[id number] DOB: [birth year] DOA: 07/10/2020 PCP: [name], MD    Brief Narrative:  Mrs. Frankland was admitted to the hospital with working diagnosis of acute cystitis, complicated by intractable nausea and vomiting.  75 year old femalewith thepast medical history for coronary artery disease, history of CVA, DVT, atrial fibrillation, diastolic heart failure, type 2 diabetes mellitus, hypertension, dyslipidemia and autoimmune syndrome who presented with dysuria and persistent nausea.She reported colicky suprapubic and bilateral flanks pain,associated with persistent nausea. On her initial physical examination temperature 97.7, blood pressure 167/96, heart rate 91, respiratory rate 24, oxygen saturation 92% on room air. She had dry mucous membranes, lungs clear to auscultation bilaterally, heart S1-S2, present, rhythmic, abdomen tender at the suprapubic region, no lower extremity edema.  Sodium 136, potassium 3.3, chloride 104, bicarb 23, glucose 239, BUN 13, creatinine 1.0, white count 7.2, hemoglobin 13.5, hematocrit 40.8, platelet count 340. SARS COVID-19 negative.  Urinalysis specific gravity 1.023, 30 protein, 21-50 red cells,>50 white cells. CT abdomen pelvis with diverticulosis, no diverticulitis, no renal calculi or obstruction.  Patient was placed on intravenous antibiotics and supportive intravenous fluids. Further work-up with stool studies was negative for C. difficile. Due to persistent nausea, GI was consulted. She was placed on antispasmodic agents along with antiemetics  Slowly improving but not yet back to baseline.   Assessment & Plan:   Principal Problem:   Acute cystitis Active Problems:   Hypertension, essential   Hypokalemia   Lower urinary tract infectious disease   Nausea without vomiting   Chronic diastolic CHF (congestive heart failure) ([name])   Stage 3 chronic kidney disease due to type 2 diabetes mellitus  ([name])   GERD (gastroesophageal reflux disease)   CAD (coronary artery disease)   Intractable vomiting   Diarrhea   Lower abdominal pain   1. Acute cystitis, urinary tract infection, Klebsiella Pneumonia (present on admission) no sepsis. Patient has completed her antibiotic therapy.   2. Intractable nausea, functional gastrointestinal disorder.  C diff negative, stool panelnegative. Abdominal film personally reviewed with no signs of obstruction.   Now is tolerating well regular diet.  Abdominal pain and headache improves with analgesics but not permament resolution.  Add long acting oxycodone for better pain control, continue with as needed  IV hydromorphone and oral IR oxycodone.  Continue with metoclopramide and will discontinue bentyl   Continue with pantoprazole 40 mg po bid and slow rate of IV fluids.   3. HTN/ CAD/ Diastolic heart failure.No acute exacerbation of heart failure Onisosorbide and metoprolol for blood pressure control.  On clopidogrel and atorvastatin.  4. Atrial fibrillation.continue with rate control with metoprolol,and anticoagulation with apixaban. Out of bed as tolerated.   5. CKD stage 3a with hypokalemia, hyponatremia and hypomagnesemia. Continue to be stable renal function and electrolytes, continue slow rate of isotonic saline. Na is 133 with K at 5,1 and serum bicarbonate at 26, cr 1,0 Follow up renal function in am.   6. T2DM/ dyslipidemia.uncontrolled hyperglycemia,  Fasting glucose is 240. On sliding scale insulin and basal insulin 10 units, for glucose cover and monitoring  On statin therapy.   7. Depression.Onduloxetine and trazodone.  8. Chronic anemia. Hgb has been stable 10,4 and hct at 32.3.   Status is: Inpatient  Remains inpatient appropriate because:IV treatments appropriate due to intensity of illness or inability to take PO   Dispo: The patient is from: Home              Anticipated  d/c is to:  Home              Patient currently is not medically stable to d/c.   Difficult to place patient No       DVT prophylaxis: apixaban    Code Status:    full Family Communication:  No family at the bedside      Nutrition Status: Nutrition Problem: Inadequate oral intake Etiology: nausea Signs/Symptoms: per patient/family report Interventions: Boost Breeze,MVI     Subjective: Patient with improvement in po intake,, no significant nausea or vomiting, but now has abdominal pain and head ache, improved with analgesics but recurrent,   Objective: Vitals:   07/17/20 2221 07/18/20 0608 07/18/20 1410 07/19/20 0441  BP: (!) 116/59 (!) 100/58 114/63 111/71  Pulse: 74 70 69 77  Resp: 17 17 15 19   Temp: 98.2 F (36.8 C) 98.3 F (36.8 C) 97.6 F (36.4 C) 97.6 F (36.4 C)  TempSrc: Oral Oral Oral Oral  SpO2: 94% 96% 96% 96%  Weight:      Height:        Intake/Output Summary (Last 24 hours) at 07/19/2020 1103 Last data filed at 07/18/2020 1838 Gross per 24 hour  Intake 572.35 ml  Output --  Net 572.35 ml   Filed Weights   07/10/20 1434 07/11/20 0943  Weight: 63.8 kg 66.6 kg    Examination:   General: Not in pain or dyspnea, deconditioned  Neurology: Awake and alert, non focal  E ENT: mild pallor, no icterus, oral mucosa moist Cardiovascular: No JVD. S1-S2 present, rhythmic, no gallops, rubs, or murmurs. No lower extremity edema. Pulmonary: positive breath sounds bilaterally, adequate air movement, no wheezing, rhonchi or rales. Gastrointestinal. Abdomen soft and non tender, not distended no guarding Skin. No rashes Musculoskeletal: no joint deformities     Data Reviewed: I have personally reviewed following labs and imaging studies  CBC: Recent Labs  Lab 07/13/20 0546 07/13/20 1506 07/15/20 0611 07/16/20 0547 07/18/20 1138  WBC 6.6  --  7.1 6.4 6.2  HGB 11.2* 11.2* 11.2* 11.0* 10.4*  HCT 34.2* 34.5* 34.8* 34.2* 32.3*  MCV 92.4  --  92.6 93.2 93.4  PLT  255  --  254 266 188   Basic Metabolic Panel: Recent Labs  Lab 07/13/20 0546 07/13/20 1054 07/14/20 0910 07/15/20 0611 07/16/20 0547 07/17/20 0513 07/18/20 0550 07/19/20 0522  NA 137  --  141 142 140 137 137 133*  K 5.3*   < > 4.4 4.3 4.4 4.5 4.6 5.1  CL 108  --  114* 111 109 105 104 101  CO2 21*  --  22 26 24 25 30 26   GLUCOSE 198*  --  122* 110* 149* 189* 161* 240*  BUN 5*  --  5* 10 11 13 11 12   CREATININE 1.01*  --  1.01* 1.23* 1.27* 1.05* 1.09* 1.03*  CALCIUM 8.5*  --  8.5* 8.7* 8.6* 8.7* 8.9 8.8*  MG 1.4*  --  1.7 1.6* 1.8  --  1.7  --   PHOS 3.0  --  4.6  --  3.5  --   --   --    < > = values in this interval not displayed.   GFR: Estimated Creatinine Clearance: 40.9 mL/min (A) (by C-G formula based on SCr of 1.03 mg/dL (H)). Liver Function Tests: No results for input(s): AST, ALT, ALKPHOS, BILITOT, PROT, ALBUMIN in the last 168 hours. No results for input(s): LIPASE, AMYLASE in the last 168 hours. No results  for input(s): AMMONIA in the last 168 hours. Coagulation Profile: No results for input(s): INR, PROTIME in the last 168 hours. Cardiac Enzymes: No results for input(s): CKTOTAL, CKMB, CKMBINDEX, TROPONINI in the last 168 hours. BNP (last 3 results) No results for input(s): PROBNP in the last 8760 hours. HbA1C: No results for input(s): HGBA1C in the last 72 hours. CBG: Recent Labs  Lab 07/18/20 0838 07/18/20 1147 07/18/20 1643 07/18/20 2200 07/19/20 0724  GLUCAP 150* 254* 151* 170* 210*   Lipid Profile: No results for input(s): CHOL, HDL, LDLCALC, TRIG, CHOLHDL, LDLDIRECT in the last 72 hours. Thyroid Function Tests: No results for input(s): TSH, T4TOTAL, FREET4, T3FREE, THYROIDAB in the last 72 hours. Anemia Panel: No results for input(s): VITAMINB12, FOLATE, FERRITIN, TIBC, IRON, RETICCTPCT in the last 72 hours.    Radiology Studies: I have reviewed all of the imaging during this hospital visit personally     Scheduled Meds: . apixaban  5  mg Oral BID  . atorvastatin  40 mg Oral QHS  . clopidogrel  75 mg Oral Daily  . dicyclomine  10 mg Oral TID AC  . DULoxetine  20 mg Oral Daily  . feeding supplement  1 Container Oral TID BM  . gabapentin  300 mg Oral BID  . Gerhardt's butt cream   Topical QID  . insulin aspart  0-15 Units Subcutaneous TID WC  . insulin aspart  0-5 Units Subcutaneous QHS  . insulin glargine  10 Units Subcutaneous Daily  . isosorbide mononitrate  15 mg Oral Daily  . latanoprost  1 drop Both Eyes QHS  . metoprolol succinate  12.5 mg Oral Daily  . pantoprazole  40 mg Oral BID AC  . vitamin B-12  1,000 mcg Oral Daily   Continuous Infusions: . lactated ringers 125 mL/hr at 07/17/20 0250     LOS: 9 days        Juandavid Dallman Gerome Apley, MD

## 2020-07-20 DIAGNOSIS — N3000 Acute cystitis without hematuria: Secondary | ICD-10-CM | POA: Diagnosis not present

## 2020-07-20 LAB — BASIC METABOLIC PANEL
Anion gap: 4 — ABNORMAL LOW (ref 5–15)
BUN: 13 mg/dL (ref 8–23)
CO2: 30 mmol/L (ref 22–32)
Calcium: 8.9 mg/dL (ref 8.9–10.3)
Chloride: 102 mmol/L (ref 98–111)
Creatinine, Ser: 1.1 mg/dL — ABNORMAL HIGH (ref 0.44–1.00)
GFR, Estimated: 52 mL/min — ABNORMAL LOW (ref >60)
Glucose, Bld: 183 mg/dL — ABNORMAL HIGH (ref 70–99)
Potassium: 5.1 mmol/L (ref 3.5–5.1)
Sodium: 136 mmol/L (ref 135–145)

## 2020-07-20 LAB — GLUCOSE, CAPILLARY
Glucose-Capillary: 202 mg/dL — ABNORMAL HIGH (ref 70–99)
Glucose-Capillary: 179 mg/dL — ABNORMAL HIGH (ref 70–99)
Glucose-Capillary: 183 mg/dL — ABNORMAL HIGH (ref 70–99)
Glucose-Capillary: 264 mg/dL — ABNORMAL HIGH (ref 70–99)
Glucose-Capillary: 171 mg/dL — ABNORMAL HIGH (ref 70–99)

## 2020-07-20 NOTE — TOC Initial Note (Signed)
Transition of Care [facility]) - Initial/Assessment Note    Patient Details  Name: Maria Fox MRN: [id number] Date of Birth: 06-17-1945  [facility] CM/SW Contact:    Lynnell Catalan, RN Phone Number: 07/20/2020, 11:07 AM  Clinical Narrative:                 Spoke with pt for dc planning. She lives with her significant other [name] who takes care of her and she has 24hr supervision. Pt politely declines home health services at this time.  Expected Discharge Plan: Home/Self Care Barriers to Discharge: No Barriers Identified   Patient Goals and CMS Choice Patient states their goals for this hospitalization and ongoing recovery are:: To go home.      Expected Discharge Plan and Services Expected Discharge Plan: Home/Self Care   Discharge Planning Services: CM Consult   Living arrangements for the past 2 months: Single Family Home Expected Discharge Date:  (unknown)                     Prior Living Arrangements/Services Living arrangements for the past 2 months: Single Family Home Lives with:: Significant Other Patient language and need for interpreter reviewed:: Yes Do you feel safe going back to the place where you live?: Yes      Need for Family Participation in Patient Care: Yes (Comment) Care giver support system in place?: Yes (comment)   Criminal Activity/Legal Involvement Pertinent to Current Situation/Hospitalization: No - Comment as needed  Activities of Daily Living Home Assistive Devices/Equipment: CBG Meter,Eyeglasses ADL Screening (condition at time of admission) Patient's cognitive ability adequate to safely complete daily activities?: Yes Is the patient deaf or have difficulty hearing?: No Does the patient have difficulty seeing, even when wearing glasses/contacts?: Yes Does the patient have difficulty concentrating, remembering, or making decisions?: No Patient able to express need for assistance with ADLs?: Yes Does the patient have difficulty  dressing or bathing?: No Independently performs ADLs?: Yes (appropriate for developmental age) Does the patient have difficulty walking or climbing stairs?: No Weakness of Legs: None Weakness of Arms/Hands: None  Permission Sought/Granted                  Emotional Assessment   Attitude/Demeanor/Rapport: Gracious Affect (typically observed): Calm Orientation: : Oriented to Self,Oriented to Place,Oriented to  Time,Oriented to Situation Alcohol / Substance Use: Not Applicable Psych Involvement: No (comment)  Admission diagnosis:  Acute cystitis [N30.00] Lower urinary tract infectious disease [N39.0] Intractable vomiting with nausea, unspecified vomiting type [R11.2] Patient Active Problem List   Diagnosis Date Noted  . Intractable vomiting   . Diarrhea   . Lower abdominal pain   . Acute cystitis 07/10/2020  . Atypical chest pain 07/04/2020  . CAD (coronary artery disease) 07/04/2020  . Low-tension glaucoma, bilateral, severe stage 03/24/2020  . Dysuria 11/08/2019  . Right sided temporal headache 08/06/2019  . Atrial flutter with rapid ventricular response ([place])   . Acute deep vein thrombosis (DVT) of left tibial vein ([place]) 07/11/2019  . Acute cholecystitis 07/07/2019  . Pain of right breast 03/15/2019  . Health maintenance examination 12/11/2018  . Type 2 diabetes mellitus with severe nonproliferative diabetic retinopathy with macular edema, bilateral ([place]) 07/16/2018  . Coronary artery disease of native artery of native heart with stable angina pectoris ([place])   . Effort angina ([place]) 11/12/2017  . Abnormal stress ECG   . Dyspnea on exertion 11/04/2017  . GERD (gastroesophageal reflux disease) 08/15/2017  . Trigger  finger 06/09/2017  . Nocturnal leg movements 03/25/2017  . Osteopenia 02/01/2017  . Advanced care planning/counseling discussion 01/02/2017  . Stage 3 chronic kidney disease due to type 2 diabetes mellitus ([place]) 08/26/2016  . Insomnia 08/26/2016  . Vitamin  B12 deficiency 11/10/2015  . General weakness 08/04/2015  . Epistaxis 07/07/2015  . Fatty liver disease, nonalcoholic 46/96/2952  . Chronic diastolic CHF (congestive heart failure) ([place]) 08/27/2014  . Positive hepatitis C antibody test 08/27/2014  . Iron deficiency 08/27/2014  . NSVT (nonsustained ventricular tachycardia) ([place])   . Nausea without vomiting   . Autonomic orthostatic hypotension 07/11/2014  . Lower urinary tract infectious disease 07/08/2014  . S/P CABG x 3 07/08/2014  . History of cerebrovascular accident (CVA) in adulthood 07/08/2014  . Hypokalemia 03/23/2014  . DM (diabetes mellitus), type 2, uncontrolled w/neurologic complication ([place]) 84/13/2440  . Itching 03/16/2012  . Rash 03/16/2012  . Medicare annual wellness visit, initial 10/28/2011  . Hyperlipidemia associated with type 2 diabetes mellitus ([place])   . Idiopathic angioedema 06/23/2011  . Vitiligo 06/23/2011  . Dermatitis 06/23/2011  . MDD (major depressive disorder), recurrent severe, without psychosis ([place]) 05/08/2006  . Hypertension, essential 05/08/2006  . MENOPAUSAL SYNDROME 05/08/2006   PCP:  [name], MD Pharmacy:   [facility] - [place], [place] - [address] [place] [address] Phone: [phone] Fax: [phone]     Social Determinants of Health (SDOH) Interventions    Readmission Risk Interventions No flowsheet data found.

## 2020-07-20 NOTE — Care Management Important Message (Signed)
Important Message  Patient Details IM Letter given to the Patient. Name: Maria Fox MRN: [id number] Date of Birth: Jan 04, 1946   Medicare Important Message Given:  Yes     Kerin Salen 07/20/2020, 10:54 AM

## 2020-07-20 NOTE — Progress Notes (Signed)
PROGRESS NOTE    Maria Fox  MWN:[id number] DOB: [birth year] DOA: 07/10/2020 PCP: [name], MD   Brief Narrative:  This 75 years old female with PMH significant for CAD, history of CVA, DVT, atrial fibrillation, diastolic heart failure, type 2 diabetes, hypertension, dyslipidemia and autoimmune syndrome who presented with complaints of dysuria and persistent nausea.  Patient also reported colic is upper abdominal and bilateral flank pain associated with persistent nausea.  CT abdomen and pelvis with diverticulosis but no diverticulitis.  Patient was admitted for UTI started on IV antibiotics.  She continues to have nausea and vomiting,  further work-up includes stool studies are negative for C. Difficile.  GI was consulted,  Patient was started on antispasmodic agents with antiemetics.  Slowly improving.  No plan for any GI intervention at this point.    Assessment & Plan:   Principal Problem:   Acute cystitis Active Problems:   Hypertension, essential   Hypokalemia   Lower urinary tract infectious disease   Nausea without vomiting   Chronic diastolic CHF (congestive heart failure) ([name])   Stage 3 chronic kidney disease due to type 2 diabetes mellitus ([name])   GERD (gastroesophageal reflux disease)   CAD (coronary artery disease)   Intractable vomiting   Diarrhea   Lower abdominal pain   Acute cystitis/ UTI due Klebsiella pneumonia: Patient has completed antibiotic therapy.  Intractable nausea /functional gastrointestinal disorder. C. difficile negative, stool panel negative. Abdominal x-ray no evidence of obstruction. GI was consulted,  recommended antispasmodic medications. No plans for any EGD at this time. Continue pantoprazole 40 mg twice daily. Continue Reglan and DC Bentyl.  Hypertension: Continue metoprolol.  Diastolic heart failure/CAD: Stable continue Plavix, Lipitor metoprolol and Imdur.  Atrial fibrillation Continue metoprolol, heart rate is  controlled.   Continue Eliquis for anticoagulation.  Depression :  continue Cymbalta and trazodone.  Type 2 diabetes:  continue Lantus 10 units daily and sliding scale.  Hyperlipidemia : continue Lipitor.   CKD stage III:  renal functions improving.  Avoid nephrotoxic medications.  DVT prophylaxis: Lovenox Code Status: Full code Family Communication: No family at bedside Disposition Plan:  Status is: Inpatient  Remains inpatient appropriate because:Inpatient level of care appropriate due to severity of illness   Dispo: The patient is from: Home              Anticipated d/c is to: Home              Patient currently is not medically stable to d/c.   Difficult to place patient No   Consultants:   GI  Procedures:  Antimicrobials:  Anti-infectives (From admission, onward)   Start     Dose/Rate Route Frequency Ordered Stop   07/14/20 1200  cephALEXin (KEFLEX) capsule 500 mg  Status:  Discontinued        500 mg Oral Every 12 hours 07/14/20 1003 07/16/20 1509   07/11/20 1800  cefTRIAXone (ROCEPHIN) 1 g in sodium chloride 0.9 % 100 mL IVPB  Status:  Discontinued        1 g 200 mL/hr over 30 Minutes Intravenous Every 24 hours 07/11/20 0829 07/14/20 1002   07/11/20 0000  aztreonam (AZACTAM) 1 g in sodium chloride 0.9 % 100 mL IVPB  Status:  Discontinued        1 g 200 mL/hr over 30 Minutes Intravenous Every 8 hours 07/10/20 2252 07/11/20 0829   07/10/20 1930  cefTRIAXone (ROCEPHIN) 1 g in sodium chloride 0.9 % 100 mL IVPB  1 g 200 mL/hr over 30 Minutes Intravenous  Once 07/10/20 1923 07/10/20 2026      Subjective: Patient was seen and examined at bedside.  She continues to complain about having sick stomach and nausea.  Overnight events noted.  Objective: Vitals:   07/19/20 1344 07/19/20 2003 07/20/20 0508 07/20/20 1354  BP: (!) 115/59 132/70 (!) 142/71 (!) 101/56  Pulse: 79 85 74 77  Resp: 18 18 20 16   Temp: 98.1 F (36.7 C) 98.5 F (36.9 C) 97.7 F (36.5  C) 98.5 F (36.9 C)  TempSrc: Oral Oral Oral Oral  SpO2: 95% 95% 97% 96%  Weight:      Height:        Intake/Output Summary (Last 24 hours) at 07/20/2020 1635 Last data filed at 07/20/2020 1355 Gross per 24 hour  Intake 1267.24 ml  Output 1100 ml  Net 167.24 ml   Filed Weights   07/10/20 1434 07/11/20 0943  Weight: 63.8 kg 66.6 kg    Examination:  General exam: Appears calm and comfortable, not in any acute distress. Respiratory system: Clear to auscultation. Respiratory effort normal. Cardiovascular system: S1 & S2 heard, RRR. No JVD, murmurs, rubs, gallops or clicks. No pedal edema. Gastrointestinal system: Abdomen is nondistended, soft and mildly tender. No organomegaly or masses felt. Normal bowel sounds heard. Central nervous system: Alert and oriented. No focal neurological deficits. Extremities: Symmetric 5 x 5 power. Skin: No rashes, lesions or ulcers Psychiatry: Judgement and insight appear normal. Mood & affect appropriate.     Data Reviewed: I have personally reviewed following labs and imaging studies  CBC: Recent Labs  Lab 07/15/20 0611 07/16/20 0547 07/18/20 1138  WBC 7.1 6.4 6.2  HGB 11.2* 11.0* 10.4*  HCT 34.8* 34.2* 32.3*  MCV 92.6 93.2 93.4  PLT 254 266 [id number]   Basic Metabolic Panel: Recent Labs  Lab 07/14/20 0910 07/15/20 0611 07/16/20 0547 07/17/20 0513 07/18/20 0550 07/19/20 0522 07/20/20 0524  NA 141 142 140 137 137 133* 136  K 4.4 4.3 4.4 4.5 4.6 5.1 5.1  CL 114* 111 109 105 104 101 102  CO2 22 26 24 25 30 26 30   GLUCOSE 122* 110* 149* 189* 161* 240* 183*  BUN 5* 10 11 13 11 12 13   CREATININE 1.01* 1.23* 1.27* 1.05* 1.09* 1.03* 1.10*  CALCIUM 8.5* 8.7* 8.6* 8.7* 8.9 8.8* 8.9  MG 1.7 1.6* 1.8  --  1.7  --   --   PHOS 4.6  --  3.5  --   --   --   --    GFR: Estimated Creatinine Clearance: 38.3 mL/min (A) (by C-G formula based on SCr of 1.1 mg/dL (H)). Liver Function Tests: No results for input(s): AST, ALT, ALKPHOS, BILITOT,  PROT, ALBUMIN in the last 168 hours. No results for input(s): LIPASE, AMYLASE in the last 168 hours. No results for input(s): AMMONIA in the last 168 hours. Coagulation Profile: No results for input(s): INR, PROTIME in the last 168 hours. Cardiac Enzymes: No results for input(s): CKTOTAL, CKMB, CKMBINDEX, TROPONINI in the last 168 hours. BNP (last 3 results) No results for input(s): PROBNP in the last 8760 hours. HbA1C: No results for input(s): HGBA1C in the last 72 hours. CBG: Recent Labs  Lab 07/19/20 1153 07/19/20 1720 07/19/20 2212 07/20/20 0753 07/20/20 1149  GLUCAP 169* 212* 202* 179* 183*   Lipid Profile: No results for input(s): CHOL, HDL, LDLCALC, TRIG, CHOLHDL, LDLDIRECT in the last 72 hours. Thyroid Function Tests: No results for input(s):  TSH, T4TOTAL, FREET4, T3FREE, THYROIDAB in the last 72 hours. Anemia Panel: No results for input(s): VITAMINB12, FOLATE, FERRITIN, TIBC, IRON, RETICCTPCT in the last 72 hours. Sepsis Labs: No results for input(s): PROCALCITON, LATICACIDVEN in the last 168 hours.  Recent Results (from the past 240 hour(s))  Urine culture     Status: Abnormal   Collection Time: 07/10/20  9:45 PM   Specimen: Urine, Clean Catch  Result Value Ref Range Status   Specimen Description   Final    URINE, CLEAN CATCH Performed at [facility], [place] [address], [place], [place] [address]    Special Requests   Final    NONE Performed at [facility], [place] [address], [place], [place] [address]    Culture >=100,000 COLONIES/mL KLEBSIELLA PNEUMONIAE (A)  Final   Report Status 07/13/2020 FINAL  Final   Organism ID, Bacteria KLEBSIELLA PNEUMONIAE (A)  Final      Susceptibility   Klebsiella pneumoniae - MIC*    AMPICILLIN >=32 RESISTANT Resistant     CEFAZOLIN <=4 SENSITIVE Sensitive     CEFEPIME <=0.12 SENSITIVE Sensitive     CEFTRIAXONE <=0.25 SENSITIVE Sensitive     CIPROFLOXACIN <=0.25 SENSITIVE Sensitive      GENTAMICIN <=1 SENSITIVE Sensitive     IMIPENEM <=0.25 SENSITIVE Sensitive     NITROFURANTOIN 64 INTERMEDIATE Intermediate     TRIMETH/SULFA <=20 SENSITIVE Sensitive     AMPICILLIN/SULBACTAM 4 SENSITIVE Sensitive     PIP/TAZO <=4 SENSITIVE Sensitive     * >=100,000 COLONIES/mL KLEBSIELLA PNEUMONIAE  SARS CORONAVIRUS 2 (TAT 6-24 HRS) Nasopharyngeal Nasopharyngeal Swab     Status: None   Collection Time: 07/10/20 10:00 PM   Specimen: Nasopharyngeal Swab  Result Value Ref Range Status   SARS Coronavirus 2 NEGATIVE NEGATIVE Final    Comment: (NOTE) SARS-CoV-2 target nucleic acids are NOT DETECTED.  The SARS-CoV-2 RNA is generally detectable in upper and lower respiratory specimens during the acute phase of infection. Negative results do not preclude SARS-CoV-2 infection, do not rule out co-infections with other pathogens, and should not be used as the sole basis for treatment or other patient management decisions. Negative results must be combined with clinical observations, patient history, and epidemiological information. The expected result is Negative.  Fact Sheet for Patients: SugarRoll.be  Fact Sheet for Healthcare Providers: https://www.woods-mathews.com/  This test is not yet approved or cleared by the [place] FDA and  has been authorized for detection and/or diagnosis of SARS-CoV-2 by FDA under an Emergency Use Authorization (EUA). This EUA will remain  in effect (meaning this test can be used) for the duration of the COVID-19 declaration under Se ction 564(b)(1) of the Act, 21 U.S.C. section 360bbb-3(b)(1), unless the authorization is terminated or revoked sooner.  Performed at [facility], [place] [address], [place], [place] [address]   C Difficile Quick Screen (NO PCR Reflex)     Status: None   Collection Time: 07/15/20 12:58 PM   Specimen: STOOL  Result Value Ref Range Status   C Diff antigen NEGATIVE NEGATIVE  Final   C Diff toxin NEGATIVE NEGATIVE Final   C Diff interpretation NEGATIVE  Final    Comment: Performed at [facility], [place] [address], [place], [place] [address]  Gastrointestinal Panel by PCR , Stool     Status: None   Collection Time: 07/15/20 12:58 PM   Specimen: Stool  Result Value Ref Range Status   Campylobacter species NOT DETECTED NOT DETECTED Final  Plesimonas shigelloides NOT DETECTED NOT DETECTED Final   Salmonella species NOT DETECTED NOT DETECTED Final   Yersinia enterocolitica NOT DETECTED NOT DETECTED Final   Vibrio species NOT DETECTED NOT DETECTED Final   Vibrio cholerae NOT DETECTED NOT DETECTED Final   Enteroaggregative E coli (EAEC) NOT DETECTED NOT DETECTED Final   Enteropathogenic E coli (EPEC) NOT DETECTED NOT DETECTED Final   Enterotoxigenic E coli (ETEC) NOT DETECTED NOT DETECTED Final   Shiga like toxin producing E coli (STEC) NOT DETECTED NOT DETECTED Final   Shigella/Enteroinvasive E coli (EIEC) NOT DETECTED NOT DETECTED Final   Cryptosporidium NOT DETECTED NOT DETECTED Final   Cyclospora cayetanensis NOT DETECTED NOT DETECTED Final   Entamoeba histolytica NOT DETECTED NOT DETECTED Final   Giardia lamblia NOT DETECTED NOT DETECTED Final   Adenovirus F40/41 NOT DETECTED NOT DETECTED Final   Astrovirus NOT DETECTED NOT DETECTED Final   Norovirus GI/GII NOT DETECTED NOT DETECTED Final   Rotavirus A NOT DETECTED NOT DETECTED Final   Sapovirus (I, II, IV, and V) NOT DETECTED NOT DETECTED Final    Comment: Performed at [facility], [address] [place], [place] [address]    Radiology Studies: No results found.  Scheduled Meds: . apixaban  5 mg Oral BID  . atorvastatin  40 mg Oral QHS  . clopidogrel  75 mg Oral Daily  . DULoxetine  20 mg Oral Daily  . feeding supplement  1 Container Oral TID BM  . gabapentin  300 mg Oral BID  . Gerhardt's butt cream   Topical QID  . insulin aspart  0-15 Units Subcutaneous TID WC  .  insulin aspart  0-5 Units Subcutaneous QHS  . insulin glargine  10 Units Subcutaneous Daily  . isosorbide mononitrate  15 mg Oral Daily  . latanoprost  1 drop Both Eyes QHS  . metoprolol succinate  12.5 mg Oral Daily  . oxyCODONE  10 mg Oral Q12H  . pantoprazole  40 mg Oral BID AC  . polyethylene glycol  17 g Oral BID  . vitamin B-12  1,000 mcg Oral Daily   Continuous Infusions: . sodium chloride 50 mL/hr at 07/20/20 0347     LOS: 10 days    Time spent: 25 mins    Sharisa Toves, MD [facility]   If 7PM-7AM, please contact night-coverage

## 2020-07-21 LAB — CBC
HCT: 32.1 % — ABNORMAL LOW (ref 36.0–46.0)
Hemoglobin: 10.3 g/dL — ABNORMAL LOW (ref 12.0–15.0)
MCH: 29.9 pg (ref 26.0–34.0)
MCHC: 32.1 g/dL (ref 30.0–36.0)
MCV: 93 fL (ref 80.0–100.0)
Platelets: 235 10*3/uL (ref 150–400)
RBC: 3.45 MIL/uL — ABNORMAL LOW (ref 3.87–5.11)
RDW: 13.7 % (ref 11.5–15.5)
WBC: 4.8 10*3/uL (ref 4.0–10.5)
nRBC: 0 % (ref 0.0–0.2)

## 2020-07-21 LAB — BASIC METABOLIC PANEL
Anion gap: 3 — ABNORMAL LOW (ref 5–15)
BUN: 13 mg/dL (ref 8–23)
CO2: 30 mmol/L (ref 22–32)
Calcium: 8.6 mg/dL — ABNORMAL LOW (ref 8.9–10.3)
Chloride: 103 mmol/L (ref 98–111)
Creatinine, Ser: 1.01 mg/dL — ABNORMAL HIGH (ref 0.44–1.00)
GFR, Estimated: 57 mL/min — ABNORMAL LOW (ref >60)
Glucose, Bld: 170 mg/dL — ABNORMAL HIGH (ref 70–99)
Potassium: 5.2 mmol/L — ABNORMAL HIGH (ref 3.5–5.1)
Sodium: 136 mmol/L (ref 135–145)

## 2020-07-21 LAB — PHOSPHORUS: Phosphorus: 3.5 mg/dL (ref 2.5–4.6)

## 2020-07-21 LAB — GLUCOSE, CAPILLARY
Glucose-Capillary: 141 mg/dL — ABNORMAL HIGH (ref 70–99)
Glucose-Capillary: 219 mg/dL — ABNORMAL HIGH (ref 70–99)
Glucose-Capillary: 211 mg/dL — ABNORMAL HIGH (ref 70–99)
Glucose-Capillary: 169 mg/dL — ABNORMAL HIGH (ref 70–99)

## 2020-07-21 LAB — MAGNESIUM: Magnesium: 1.6 mg/dL — ABNORMAL LOW (ref 1.7–2.4)

## 2020-07-21 LAB — POTASSIUM: Potassium: 4.8 mmol/L (ref 3.5–5.1)

## 2020-07-21 MED ORDER — MAGNESIUM SULFATE 2 GM/50ML IV SOLN
2.0000 g | Freq: Once | INTRAVENOUS | Status: AC
  Administered 2020-07-21: 2 g via INTRAVENOUS
  Filled 2020-07-21: qty 50

## 2020-07-21 NOTE — Progress Notes (Signed)
PROGRESS NOTE    Maria Fox  KWI:[id number] DOB: 08-Dec-1945 DOA: 07/10/2020 PCP: [name], MD   Brief Narrative:  This 75 years old female with PMH significant for CAD, history of CVA, DVT, atrial fibrillation, diastolic heart failure, type 2 diabetes, hypertension, dyslipidemia and autoimmune syndrome who presented with complaints of dysuria and persistent nausea.  Patient also reported colic is upper abdominal and bilateral flank pain associated with persistent nausea.  CT abdomen and pelvis with diverticulosis but no diverticulitis.  Patient was admitted for UTI started on IV antibiotics.  She continues to have nausea and vomiting,  further work-up includes stool studies are negative for C. Difficile.  GI was consulted,  Patient was started on antispasmodic agents with antiemetics.  Slowly improving.  No plan for any GI intervention at this point.    Assessment & Plan:   Principal Problem:   Acute cystitis Active Problems:   Hypertension, essential   Hypokalemia   Lower urinary tract infectious disease   Nausea without vomiting   Chronic diastolic CHF (congestive heart failure) ([name])   Stage 3 chronic kidney disease due to type 2 diabetes mellitus ([name])   GERD (gastroesophageal reflux disease)   CAD (coronary artery disease)   Intractable vomiting   Diarrhea   Lower abdominal pain   Acute cystitis/ UTI due Klebsiella pneumonia: Patient has completed antibiotic therapy.  Intractable nausea /functional gastrointestinal disorder. C. difficile negative, stool panel negative. Abdominal x-ray no evidence of obstruction. GI was consulted,  recommended antispasmodic medications. No plans for any EGD at this time. Continue pantoprazole 40 mg twice daily. Continue Reglan and DC Bentyl.  Hypertension: Continue metoprolol.  Diastolic heart failure/CAD: Stable,  continue Plavix, Lipitor metoprolol and Imdur.  Atrial fibrillation Continue metoprolol, heart rate is  controlled.   Continue Eliquis for anticoagulation.  Depression :  continue Cymbalta and trazodone.  Type 2 diabetes:  continue Lantus 10 units daily and sliding scale.  Hyperlipidemia : continue Lipitor.   CKD stage III:  renal functions improving.  Avoid nephrotoxic medications.  DVT prophylaxis: Lovenox Code Status: Full code Family Communication: No family at bedside Disposition Plan:  Status is: Inpatient  Remains inpatient appropriate because:Inpatient level of care appropriate due to severity of illness   Dispo: The patient is from: Home              Anticipated d/c is to: Home              Patient currently is not medically stable to d/c.   Difficult to place patient No   Consultants:   GI  Procedures:  Antimicrobials:  Anti-infectives (From admission, onward)   Start     Dose/Rate Route Frequency Ordered Stop   07/14/20 1200  cephALEXin (KEFLEX) capsule 500 mg  Status:  Discontinued        500 mg Oral Every 12 hours 07/14/20 1003 07/16/20 1509   07/11/20 1800  cefTRIAXone (ROCEPHIN) 1 g in sodium chloride 0.9 % 100 mL IVPB  Status:  Discontinued        1 g 200 mL/hr over 30 Minutes Intravenous Every 24 hours 07/11/20 0829 07/14/20 1002   07/11/20 0000  aztreonam (AZACTAM) 1 g in sodium chloride 0.9 % 100 mL IVPB  Status:  Discontinued        1 g 200 mL/hr over 30 Minutes Intravenous Every 8 hours 07/10/20 2252 07/11/20 0829   07/10/20 1930  cefTRIAXone (ROCEPHIN) 1 g in sodium chloride 0.9 % 100 mL IVPB  1 g 200 mL/hr over 30 Minutes Intravenous  Once 07/10/20 1923 07/10/20 2026      Subjective: Patient was seen and examined at bedside.  She continues to complain about left sided abdominal pain,   Overnight events noted. She reports diarrhea has resolved, denies nausea and vomiting .  Objective: Vitals:   07/20/20 1354 07/20/20 2236 07/21/20 0553 07/21/20 1322  BP: (!) 101/56 136/72 113/62 138/76  Pulse: 77 69 74 82  Resp: 16 17 17 16    Temp: 98.5 F (36.9 C) 98.1 F (36.7 C) 98.1 F (36.7 C) 98.2 F (36.8 C)  TempSrc: Oral Oral Oral   SpO2: 96% 100% 97% 96%  Weight:      Height:        Intake/Output Summary (Last 24 hours) at 07/21/2020 1612 Last data filed at 07/21/2020 1539 Gross per 24 hour  Intake 240 ml  Output 1075 ml  Net -835 ml   Filed Weights   07/10/20 1434 07/11/20 0943  Weight: 63.8 kg 66.6 kg    Examination:  General exam: Appears calm and comfortable, not in any acute distress. Respiratory system: Clear to auscultation. Respiratory effort normal. Cardiovascular system: S1 & S2 heard, RRR. No JVD, murmurs, rubs, gallops or clicks. No pedal edema. Gastrointestinal system: Abdomen is nondistended, soft and mildly tender LLQ. No organomegaly or masses felt.  Normal bowel sounds heard. Central nervous system: Alert and oriented x 3. No focal neurological deficits. Extremities: Symmetric 5 x 5 power. Skin: No rashes, lesions or ulcers Psychiatry: Judgement and insight appear normal. Mood & affect appropriate.     Data Reviewed: I have personally reviewed following labs and imaging studies  CBC: Recent Labs  Lab 07/15/20 0611 07/16/20 0547 07/18/20 1138 07/21/20 0650  WBC 7.1 6.4 6.2 4.8  HGB 11.2* 11.0* 10.4* 10.3*  HCT 34.8* 34.2* 32.3* 32.1*  MCV 92.6 93.2 93.4 93.0  PLT 254 266 253 858   Basic Metabolic Panel: Recent Labs  Lab 07/15/20 0611 07/16/20 0547 07/17/20 0513 07/18/20 0550 07/19/20 0522 07/20/20 0524 07/21/20 0650 07/21/20 1051  NA 142 140 137 137 133* 136 136  --   K 4.3 4.4 4.5 4.6 5.1 5.1 5.2* 4.8  CL 111 109 105 104 101 102 103  --   CO2 26 24 25 30 26 30 30   --   GLUCOSE 110* 149* 189* 161* 240* 183* 170*  --   BUN 10 11 13 11 12 13 13   --   CREATININE 1.23* 1.27* 1.05* 1.09* 1.03* 1.10* 1.01*  --   CALCIUM 8.7* 8.6* 8.7* 8.9 8.8* 8.9 8.6*  --   MG 1.6* 1.8  --  1.7  --   --  1.6*  --   PHOS  --  3.5  --   --   --   --  3.5  --    GFR: Estimated  Creatinine Clearance: 41.8 mL/min (A) (by C-G formula based on SCr of 1.01 mg/dL (H)). Liver Function Tests: No results for input(s): AST, ALT, ALKPHOS, BILITOT, PROT, ALBUMIN in the last 168 hours. No results for input(s): LIPASE, AMYLASE in the last 168 hours. No results for input(s): AMMONIA in the last 168 hours. Coagulation Profile: No results for input(s): INR, PROTIME in the last 168 hours. Cardiac Enzymes: No results for input(s): CKTOTAL, CKMB, CKMBINDEX, TROPONINI in the last 168 hours. BNP (last 3 results) No results for input(s): PROBNP in the last 8760 hours. HbA1C: No results for input(s): HGBA1C in the last 72  hours. CBG: Recent Labs  Lab 07/20/20 1727 07/20/20 2234 07/21/20 0734 07/21/20 1130 07/21/20 1549  GLUCAP 264* 171* 141* 219* 211*   Lipid Profile: No results for input(s): CHOL, HDL, LDLCALC, TRIG, CHOLHDL, LDLDIRECT in the last 72 hours. Thyroid Function Tests: No results for input(s): TSH, T4TOTAL, FREET4, T3FREE, THYROIDAB in the last 72 hours. Anemia Panel: No results for input(s): VITAMINB12, FOLATE, FERRITIN, TIBC, IRON, RETICCTPCT in the last 72 hours. Sepsis Labs: No results for input(s): PROCALCITON, LATICACIDVEN in the last 168 hours.  Recent Results (from the past 240 hour(s))  C Difficile Quick Screen (NO PCR Reflex)     Status: None   Collection Time: 07/15/20 12:58 PM   Specimen: STOOL  Result Value Ref Range Status   C Diff antigen NEGATIVE NEGATIVE Final   C Diff toxin NEGATIVE NEGATIVE Final   C Diff interpretation NEGATIVE  Final    Comment: Performed at [facility], [place] [address] [place], [place] [address]  Gastrointestinal Panel by PCR , Stool     Status: None   Collection Time: 07/15/20 12:58 PM   Specimen: Stool  Result Value Ref Range Status   Campylobacter species NOT DETECTED NOT DETECTED Final   Plesimonas shigelloides NOT DETECTED NOT DETECTED Final   Salmonella species NOT DETECTED NOT DETECTED  Final   Yersinia enterocolitica NOT DETECTED NOT DETECTED Final   Vibrio species NOT DETECTED NOT DETECTED Final   Vibrio cholerae NOT DETECTED NOT DETECTED Final   Enteroaggregative E coli (EAEC) NOT DETECTED NOT DETECTED Final   Enteropathogenic E coli (EPEC) NOT DETECTED NOT DETECTED Final   Enterotoxigenic E coli (ETEC) NOT DETECTED NOT DETECTED Final   Shiga like toxin producing E coli (STEC) NOT DETECTED NOT DETECTED Final   Shigella/Enteroinvasive E coli (EIEC) NOT DETECTED NOT DETECTED Final   Cryptosporidium NOT DETECTED NOT DETECTED Final   Cyclospora cayetanensis NOT DETECTED NOT DETECTED Final   Entamoeba histolytica NOT DETECTED NOT DETECTED Final   Giardia lamblia NOT DETECTED NOT DETECTED Final   Adenovirus F40/41 NOT DETECTED NOT DETECTED Final   Astrovirus NOT DETECTED NOT DETECTED Final   Norovirus GI/GII NOT DETECTED NOT DETECTED Final   Rotavirus A NOT DETECTED NOT DETECTED Final   Sapovirus (I, II, IV, and V) NOT DETECTED NOT DETECTED Final    Comment: Performed at [facility], [address] [place], [place] [address]    Radiology Studies: No results found.  Scheduled Meds: . apixaban  5 mg Oral BID  . atorvastatin  40 mg Oral QHS  . clopidogrel  75 mg Oral Daily  . DULoxetine  20 mg Oral Daily  . feeding supplement  1 Container Oral TID BM  . gabapentin  300 mg Oral BID  . Gerhardt's butt cream   Topical QID  . insulin aspart  0-15 Units Subcutaneous TID WC  . insulin aspart  0-5 Units Subcutaneous QHS  . insulin glargine  10 Units Subcutaneous Daily  . isosorbide mononitrate  15 mg Oral Daily  . latanoprost  1 drop Both Eyes QHS  . metoprolol succinate  12.5 mg Oral Daily  . oxyCODONE  10 mg Oral Q12H  . pantoprazole  40 mg Oral BID AC  . polyethylene glycol  17 g Oral BID  . vitamin B-12  1,000 mcg Oral Daily   Continuous Infusions: . sodium chloride 50 mL/hr at 07/20/20 0347     LOS: 11 days    Time spent: 25 mins    Naresh Althaus  Dwyane Dee, MD [facility]   If 7PM-7AM, please contact night-coverage

## 2020-07-21 NOTE — Progress Notes (Signed)
OT Cancellation Note  Patient Details Name: Maria Fox MRN: [id number] DOB: 1945/12/30   Cancelled Treatment:    Reason Eval/Treat Not Completed: Pain limiting ability to participate. Patient reporting migraine and side pain and unable to tolerate therapy. Will f/u as able.  Dae Antonucci L Steve Gregg 07/21/2020, 2:44 PM

## 2020-07-21 NOTE — Progress Notes (Signed)
PT Cancellation Note  Patient Details Name: Maria Fox MRN: [id number] DOB: 1945-06-11   Cancelled Treatment:    Reason Eval/Treat Not Completed: Other (comment); before opening eyes pt states "I can't get up".  Reports she Is feeling terrible and her side is hurting. " I can't do it"   [facility] 07/21/2020, 3:32 PM

## 2020-07-22 LAB — GLUCOSE, CAPILLARY
Glucose-Capillary: 140 mg/dL — ABNORMAL HIGH (ref 70–99)
Glucose-Capillary: 185 mg/dL — ABNORMAL HIGH (ref 70–99)
Glucose-Capillary: 203 mg/dL — ABNORMAL HIGH (ref 70–99)
Glucose-Capillary: 214 mg/dL — ABNORMAL HIGH (ref 70–99)

## 2020-07-22 LAB — BASIC METABOLIC PANEL
Anion gap: 6 (ref 5–15)
BUN: 12 mg/dL (ref 8–23)
CO2: 29 mmol/L (ref 22–32)
Calcium: 8.8 mg/dL — ABNORMAL LOW (ref 8.9–10.3)
Chloride: 101 mmol/L (ref 98–111)
Creatinine, Ser: 0.9 mg/dL (ref 0.44–1.00)
GFR, Estimated: >60 mL/min (ref >60)
Glucose, Bld: 172 mg/dL — ABNORMAL HIGH (ref 70–99)
Potassium: 4.7 mmol/L (ref 3.5–5.1)
Sodium: 136 mmol/L (ref 135–145)

## 2020-07-22 LAB — MAGNESIUM: Magnesium: 1.9 mg/dL (ref 1.7–2.4)

## 2020-07-22 LAB — PHOSPHORUS: Phosphorus: 3.5 mg/dL (ref 2.5–4.6)

## 2020-07-22 NOTE — Progress Notes (Addendum)
PROGRESS NOTE    Maria Fox  YSA:[id number] DOB: 02-25-1946 DOA: 07/10/2020 PCP: [name], MD   Brief Narrative:  This 75 years old female with PMH significant for CAD, history of CVA, DVT, atrial fibrillation, diastolic heart failure, type 2 diabetes, hypertension, dyslipidemia and autoimmune syndrome who presented with complaints of dysuria and persistent nausea.  Patient also reported colic is upper abdominal and bilateral flank pain associated with persistent nausea.  CT abdomen and pelvis with diverticulosis but no diverticulitis.  Patient was admitted for UTI started on IV antibiotics.  She continues to have nausea and vomiting,  further work-up includes stool studies are negative for C. Difficile.  GI was consulted,  Patient was started on antispasmodic agents with antiemetics.  Slowly improving.  No plan for any GI intervention at this point.    Assessment & Plan:   Principal Problem:   Acute cystitis Active Problems:   Hypertension, essential   Hypokalemia   Lower urinary tract infectious disease   Nausea without vomiting   Chronic diastolic CHF (congestive heart failure) ([name])   Stage 3 chronic kidney disease due to type 2 diabetes mellitus ([name])   GERD (gastroesophageal reflux disease)   CAD (coronary artery disease)   Intractable vomiting   Diarrhea   Lower abdominal pain   Acute cystitis/ UTI due Klebsiella pneumonia: Patient has completed antibiotic therapy.  Intractable nausea /functional gastrointestinal disorder. C. difficile negative, stool panel negative. Abdominal x-ray no evidence of obstruction. GI was consulted,  recommended antispasmodic medications. No plans for any EGD at this time. Continue pantoprazole 40 mg twice daily. Continue Reglan and DC Bentyl.  Left Lower quadrant pain: Patient reports left lower quadrant pain associated with constipation. Recent CT abdomen shows diverticulosis without diverticulitis. Patient is still reports  having sick stomach.  Hypertension: Continue metoprolol.  Diastolic heart failure/CAD: Stable,  continue Plavix, Lipitor, metoprolol and Imdur.  Atrial fibrillation Continue metoprolol, heart rate is controlled.   Continue Eliquis for anticoagulation.  Depression :  continue Cymbalta and trazodone.  Type 2 diabetes:  continue Lantus 10 units daily and sliding scale.  Hyperlipidemia : continue Lipitor.   CKD stage III:  renal functions improving.  Avoid nephrotoxic medications.  DVT prophylaxis: Lovenox Code Status: Full code Family Communication: No family at bedside Disposition Plan:  Status is: Inpatient  Remains inpatient appropriate because:Inpatient level of care appropriate due to severity of illness   Dispo: The patient is from: Home              Anticipated d/c is to: Home              Patient currently is not medically stable to d/c.   Difficult to place patient No   Consultants:   GI  Procedures:  Antimicrobials:  Anti-infectives (From admission, onward)   Start     Dose/Rate Route Frequency Ordered Stop   07/14/20 1200  cephALEXin (KEFLEX) capsule 500 mg  Status:  Discontinued        500 mg Oral Every 12 hours 07/14/20 1003 07/16/20 1509   07/11/20 1800  cefTRIAXone (ROCEPHIN) 1 g in sodium chloride 0.9 % 100 mL IVPB  Status:  Discontinued        1 g 200 mL/hr over 30 Minutes Intravenous Every 24 hours 07/11/20 0829 07/14/20 1002   07/11/20 0000  aztreonam (AZACTAM) 1 g in sodium chloride 0.9 % 100 mL IVPB  Status:  Discontinued        1 g 200 mL/hr over  30 Minutes Intravenous Every 8 hours 07/10/20 2252 07/11/20 0829   07/10/20 1930  cefTRIAXone (ROCEPHIN) 1 g in sodium chloride 0.9 % 100 mL IVPB        1 g 200 mL/hr over 30 Minutes Intravenous  Once 07/10/20 1923 07/10/20 2026      Subjective: Patient was seen and examined at bedside.  She continues to complain about left sided abdominal pain,   Overnight events noted. She reports diarrhea  has resolved, denies nausea and vomiting .  Objective: Vitals:   07/21/20 2124 07/22/20 0510 07/22/20 0943 07/22/20 1331  BP: (!) 144/80 (!) 156/76 (!) 155/72 121/71  Pulse: 81 71 72 69  Resp: 15 15  19   Temp: 97.7 F (36.5 C) 98.9 F (37.2 C)  98.6 F (37 C)  TempSrc: Oral Oral  Oral  SpO2: 93% 95%  93%  Weight:      Height:        Intake/Output Summary (Last 24 hours) at 07/22/2020 1438 Last data filed at 07/22/2020 1229 Gross per 24 hour  Intake 3422.53 ml  Output 1200 ml  Net 2222.53 ml   Filed Weights   07/10/20 1434 07/11/20 0943  Weight: 63.8 kg 66.6 kg    Examination:  General exam: Appears calm and comfortable, not in any acute distress. Respiratory system: Clear to auscultation. Respiratory effort normal. Cardiovascular system: S1 & S2 heard, RRR. No JVD, murmurs, rubs, gallops or clicks. No pedal edema. Gastrointestinal system: Abdomen is nondistended, soft and mildly tender LLQ. No organomegaly or masses felt.  Normal bowel sounds heard. Central nervous system: Alert and oriented x 3. No focal neurological deficits. Extremities: Symmetric 5 x 5 power. Skin: No rashes, lesions or ulcers Psychiatry: Judgement and insight appear normal. Mood & affect appropriate.     Data Reviewed: I have personally reviewed following labs and imaging studies  CBC: Recent Labs  Lab 07/16/20 0547 07/18/20 1138 07/21/20 0650  WBC 6.4 6.2 4.8  HGB 11.0* 10.4* 10.3*  HCT 34.2* 32.3* 32.1*  MCV 93.2 93.4 93.0  PLT 266 253 546   Basic Metabolic Panel: Recent Labs  Lab 07/16/20 0547 07/17/20 0513 07/18/20 0550 07/19/20 0522 07/20/20 0524 07/21/20 0650 07/21/20 1051 07/22/20 0624  NA 140   < > 137 133* 136 136  --  136  K 4.4   < > 4.6 5.1 5.1 5.2* 4.8 4.7  CL 109   < > 104 101 102 103  --  101  CO2 24   < > 30 26 30 30   --  29  GLUCOSE 149*   < > 161* 240* 183* 170*  --  172*  BUN 11   < > 11 12 13 13   --  12  CREATININE 1.27*   < > 1.09* 1.03* 1.10* 1.01*   --  0.90  CALCIUM 8.6*   < > 8.9 8.8* 8.9 8.6*  --  8.8*  MG 1.8  --  1.7  --   --  1.6*  --  1.9  PHOS 3.5  --   --   --   --  3.5  --  3.5   < > = values in this interval not displayed.   GFR: Estimated Creatinine Clearance: 46.9 mL/min (by C-G formula based on SCr of 0.9 mg/dL). Liver Function Tests: No results for input(s): AST, ALT, ALKPHOS, BILITOT, PROT, ALBUMIN in the last 168 hours. No results for input(s): LIPASE, AMYLASE in the last 168 hours. No results for input(s): AMMONIA  in the last 168 hours. Coagulation Profile: No results for input(s): INR, PROTIME in the last 168 hours. Cardiac Enzymes: No results for input(s): CKTOTAL, CKMB, CKMBINDEX, TROPONINI in the last 168 hours. BNP (last 3 results) No results for input(s): PROBNP in the last 8760 hours. HbA1C: No results for input(s): HGBA1C in the last 72 hours. CBG: Recent Labs  Lab 07/21/20 1549 07/21/20 2002 07/22/20 0004 07/22/20 0740 07/22/20 1157  GLUCAP 211* 169* 203* 214* 185*   Lipid Profile: No results for input(s): CHOL, HDL, LDLCALC, TRIG, CHOLHDL, LDLDIRECT in the last 72 hours. Thyroid Function Tests: No results for input(s): TSH, T4TOTAL, FREET4, T3FREE, THYROIDAB in the last 72 hours. Anemia Panel: No results for input(s): VITAMINB12, FOLATE, FERRITIN, TIBC, IRON, RETICCTPCT in the last 72 hours. Sepsis Labs: No results for input(s): PROCALCITON, LATICACIDVEN in the last 168 hours.  Recent Results (from the past 240 hour(s))  C Difficile Quick Screen (NO PCR Reflex)     Status: None   Collection Time: 07/15/20 12:58 PM   Specimen: STOOL  Result Value Ref Range Status   C Diff antigen NEGATIVE NEGATIVE Final   C Diff toxin NEGATIVE NEGATIVE Final   C Diff interpretation NEGATIVE  Final    Comment: Performed at [facility], [place] [address] [place], [place] [address]  Gastrointestinal Panel by PCR , Stool     Status: None   Collection Time: 07/15/20 12:58 PM   Specimen:  Stool  Result Value Ref Range Status   Campylobacter species NOT DETECTED NOT DETECTED Final   Plesimonas shigelloides NOT DETECTED NOT DETECTED Final   Salmonella species NOT DETECTED NOT DETECTED Final   Yersinia enterocolitica NOT DETECTED NOT DETECTED Final   Vibrio species NOT DETECTED NOT DETECTED Final   Vibrio cholerae NOT DETECTED NOT DETECTED Final   Enteroaggregative E coli (EAEC) NOT DETECTED NOT DETECTED Final   Enteropathogenic E coli (EPEC) NOT DETECTED NOT DETECTED Final   Enterotoxigenic E coli (ETEC) NOT DETECTED NOT DETECTED Final   Shiga like toxin producing E coli (STEC) NOT DETECTED NOT DETECTED Final   Shigella/Enteroinvasive E coli (EIEC) NOT DETECTED NOT DETECTED Final   Cryptosporidium NOT DETECTED NOT DETECTED Final   Cyclospora cayetanensis NOT DETECTED NOT DETECTED Final   Entamoeba histolytica NOT DETECTED NOT DETECTED Final   Giardia lamblia NOT DETECTED NOT DETECTED Final   Adenovirus F40/41 NOT DETECTED NOT DETECTED Final   Astrovirus NOT DETECTED NOT DETECTED Final   Norovirus GI/GII NOT DETECTED NOT DETECTED Final   Rotavirus A NOT DETECTED NOT DETECTED Final   Sapovirus (I, II, IV, and V) NOT DETECTED NOT DETECTED Final    Comment: Performed at [facility], [address], [place], [place] [address]    Radiology Studies: No results found.  Scheduled Meds: . apixaban  5 mg Oral BID  . atorvastatin  40 mg Oral QHS  . clopidogrel  75 mg Oral Daily  . DULoxetine  20 mg Oral Daily  . feeding supplement  1 Container Oral TID BM  . gabapentin  300 mg Oral BID  . Gerhardt's butt cream   Topical QID  . insulin aspart  0-15 Units Subcutaneous TID WC  . insulin aspart  0-5 Units Subcutaneous QHS  . insulin glargine  10 Units Subcutaneous Daily  . isosorbide mononitrate  15 mg Oral Daily  . latanoprost  1 drop Both Eyes QHS  . metoprolol succinate  12.5 mg Oral Daily  . oxyCODONE  10 mg Oral Q12H  .  pantoprazole  40 mg Oral BID AC  .  polyethylene glycol  17 g Oral BID  . vitamin B-12  1,000 mcg Oral Daily   Continuous Infusions: . sodium chloride 50 mL/hr at 07/22/20 1206     LOS: 12 days    Time spent: 25 mins    Asheton Scheffler, MD [facility]   If 7PM-7AM, please contact night-coverage

## 2020-07-23 ENCOUNTER — Inpatient Hospital Stay (HOSPITAL_COMMUNITY): Payer: Medicare Other

## 2020-07-23 LAB — BASIC METABOLIC PANEL
Anion gap: 6 (ref 5–15)
BUN: 12 mg/dL (ref 8–23)
CO2: 24 mmol/L (ref 22–32)
Calcium: 8.9 mg/dL (ref 8.9–10.3)
Chloride: 106 mmol/L (ref 98–111)
Creatinine, Ser: 0.96 mg/dL (ref 0.44–1.00)
GFR, Estimated: >60 mL/min (ref >60)
Glucose, Bld: 166 mg/dL — ABNORMAL HIGH (ref 70–99)
Potassium: 5.2 mmol/L — ABNORMAL HIGH (ref 3.5–5.1)
Sodium: 136 mmol/L (ref 135–145)

## 2020-07-23 LAB — POTASSIUM: Potassium: 5.3 mmol/L — ABNORMAL HIGH (ref 3.5–5.1)

## 2020-07-23 LAB — CBC
HCT: 34.5 % — ABNORMAL LOW (ref 36.0–46.0)
Hemoglobin: 11.2 g/dL — ABNORMAL LOW (ref 12.0–15.0)
MCH: 30.1 pg (ref 26.0–34.0)
MCHC: 32.5 g/dL (ref 30.0–36.0)
MCV: 92.7 fL (ref 80.0–100.0)
Platelets: 256 10*3/uL (ref 150–400)
RBC: 3.72 MIL/uL — ABNORMAL LOW (ref 3.87–5.11)
RDW: 13.7 % (ref 11.5–15.5)
WBC: 5.8 10*3/uL (ref 4.0–10.5)
nRBC: 0 % (ref 0.0–0.2)

## 2020-07-23 LAB — GLUCOSE, CAPILLARY
Glucose-Capillary: 171 mg/dL — ABNORMAL HIGH (ref 70–99)
Glucose-Capillary: 158 mg/dL — ABNORMAL HIGH (ref 70–99)
Glucose-Capillary: 197 mg/dL — ABNORMAL HIGH (ref 70–99)
Glucose-Capillary: 150 mg/dL — ABNORMAL HIGH (ref 70–99)

## 2020-07-23 MED ORDER — SODIUM ZIRCONIUM CYCLOSILICATE 5 G PO PACK
5.0000 g | PACK | Freq: Once | ORAL | Status: AC
  Administered 2020-07-23: 5 g via ORAL
  Filled 2020-07-23: qty 1

## 2020-07-23 MED ORDER — BARIUM SULFATE 2.1 % PO SUSP
1000.0000 mL | ORAL | Status: AC
  Administered 2020-07-23: 1000 mL via ORAL

## 2020-07-23 NOTE — Progress Notes (Signed)
PROGRESS NOTE    Maria Fox  WER:[id number] DOB: 1945-05-29 DOA: 07/10/2020 PCP: [name], MD   Brief Narrative:  This 75 years old female with PMH significant for CAD, history of CVA, DVT, atrial fibrillation, diastolic heart failure, type 2 diabetes, hypertension, dyslipidemia and autoimmune syndrome who presented with complaints of dysuria and persistent nausea.  Patient also reported colic is upper abdominal and bilateral flank pain associated with persistent nausea.  CT abdomen and pelvis with diverticulosis but no diverticulitis.  Patient was admitted for UTI started on IV antibiotics.  She continues to have nausea and vomiting,  further work-up includes stool studies are negative for C. Difficile.  GI was consulted,  Patient was started on antispasmodic agents with antiemetics.  Slowly improving.  No plan for any GI intervention at this point.  Assessment & Plan:   Principal Problem:   Acute cystitis Active Problems:   Hypertension, essential   Hypokalemia   Lower urinary tract infectious disease   Nausea without vomiting   Chronic diastolic CHF (congestive heart failure) ([name])   Stage 3 chronic kidney disease due to type 2 diabetes mellitus ([name])   GERD (gastroesophageal reflux disease)   CAD (coronary artery disease)   Intractable vomiting   Diarrhea   Lower abdominal pain   Acute cystitis/ UTI due Klebsiella pneumonia: Patient has completed antibiotic therapy.  Intractable nausea /functional gastrointestinal disorder. C. difficile negative, stool panel negative. Abdominal x-ray no evidence of obstruction. GI was consulted,  recommended antispasmodic medications. No plans for any EGD at this time. Continue pantoprazole 40 mg twice daily. Continue Reglan and DC Bentyl.  Left Lower quadrant pain: Patient reports left lower quadrant pain associated with constipation. Recent CT abdomen shows diverticulosis without diverticulitis. Patient still has significant  left lower quadrant abdominal pain. Obtain repeat CT abdominal pelvis rule out diverticulitis.  Hypertension: Continue metoprolol.  Diastolic heart failure/CAD: Stable,  continue Plavix, Lipitor, metoprolol and Imdur.  Atrial fibrillation Continue metoprolol, heart rate is controlled.   Continue Eliquis for anticoagulation.  Depression :  continue Cymbalta and trazodone.  Type 2 diabetes:  continue Lantus 10 units daily and sliding scale.  Hyperlipidemia : continue Lipitor.   CKD stage III:  renal functions improving.  Avoid nephrotoxic medications.  DVT prophylaxis: Lovenox Code Status: Full code Family Communication: No family at bedside Disposition Plan:  Status is: Inpatient  Remains inpatient appropriate because:Inpatient level of care appropriate due to severity of illness   Dispo: The patient is from: Home              Anticipated d/c is to: Home              Patient currently is not medically stable to d/c.   Difficult to place patient No   Consultants:   GI  Procedures:  Antimicrobials:  Anti-infectives (From admission, onward)   Start     Dose/Rate Route Frequency Ordered Stop   07/14/20 1200  cephALEXin (KEFLEX) capsule 500 mg  Status:  Discontinued        500 mg Oral Every 12 hours 07/14/20 1003 07/16/20 1509   07/11/20 1800  cefTRIAXone (ROCEPHIN) 1 g in sodium chloride 0.9 % 100 mL IVPB  Status:  Discontinued        1 g 200 mL/hr over 30 Minutes Intravenous Every 24 hours 07/11/20 0829 07/14/20 1002   07/11/20 0000  aztreonam (AZACTAM) 1 g in sodium chloride 0.9 % 100 mL IVPB  Status:  Discontinued  1 g 200 mL/hr over 30 Minutes Intravenous Every 8 hours 07/10/20 2252 07/11/20 0829   07/10/20 1930  cefTRIAXone (ROCEPHIN) 1 g in sodium chloride 0.9 % 100 mL IVPB        1 g 200 mL/hr over 30 Minutes Intravenous  Once 07/10/20 1923 07/10/20 2026      Subjective: Patient was seen and examined at bedside.  She continues to complain about  left sided abdominal pain,   Overnight events noted. She reports diarrhea has resolved, denies nausea and vomiting .  Objective: Vitals:   07/22/20 2206 07/23/20 0528 07/23/20 1017 07/23/20 1319  BP: (!) 151/79 (!) 156/88 (!) 148/84 126/71  Pulse: 70 73 82 75  Resp: 20 20  18   Temp: 98.1 F (36.7 C) 97.8 F (36.6 C)  97.9 F (36.6 C)  TempSrc: Oral Oral  Oral  SpO2: 95% 94%  93%  Weight:      Height:        Intake/Output Summary (Last 24 hours) at 07/23/2020 1554 Last data filed at 07/23/2020 1000 Gross per 24 hour  Intake 1266.51 ml  Output 1000 ml  Net 266.51 ml   Filed Weights   07/10/20 1434 07/11/20 0943  Weight: 63.8 kg 66.6 kg    Examination:  General exam: Appears calm and comfortable, not in any acute distress. Respiratory system: Clear to auscultation. Respiratory effort normal. Cardiovascular system: S1 & S2 heard, RRR. No JVD, murmurs, rubs, gallops or clicks. No pedal edema. Gastrointestinal system: Abdomen is nondistended, soft and  tenderness LLQ. No organomegaly or masses felt.  Normal bowel sounds heard. Central nervous system: Alert and oriented x 3. No focal neurological deficits. Extremities: Symmetric 5 x 5 power. Skin: No rashes, lesions or ulcers Psychiatry: Judgement and insight appear normal. Mood & affect appropriate.     Data Reviewed: I have personally reviewed following labs and imaging studies  CBC: Recent Labs  Lab 07/18/20 1138 07/21/20 0650 07/23/20 0518  WBC 6.2 4.8 5.8  HGB 10.4* 10.3* 11.2*  HCT 32.3* 32.1* 34.5*  MCV 93.4 93.0 92.7  PLT 253 235 277   Basic Metabolic Panel: Recent Labs  Lab 07/18/20 0550 07/19/20 0522 07/20/20 0524 07/21/20 0650 07/21/20 1051 07/22/20 0624 07/23/20 0518 07/23/20 1002  NA 137 133* 136 136  --  136 136  --   K 4.6 5.1 5.1 5.2* 4.8 4.7 5.2* 5.3*  CL 104 101 102 103  --  101 106  --   CO2 30 26 30 30   --  29 24  --   GLUCOSE 161* 240* 183* 170*  --  172* 166*  --   BUN 11 12 13  13   --  12 12  --   CREATININE 1.09* 1.03* 1.10* 1.01*  --  0.90 0.96  --   CALCIUM 8.9 8.8* 8.9 8.6*  --  8.8* 8.9  --   MG 1.7  --   --  1.6*  --  1.9  --   --   PHOS  --   --   --  3.5  --  3.5  --   --    GFR: Estimated Creatinine Clearance: 43.9 mL/min (by C-G formula based on SCr of 0.96 mg/dL). Liver Function Tests: No results for input(s): AST, ALT, ALKPHOS, BILITOT, PROT, ALBUMIN in the last 168 hours. No results for input(s): LIPASE, AMYLASE in the last 168 hours. No results for input(s): AMMONIA in the last 168 hours. Coagulation Profile: No results for input(s): INR,  PROTIME in the last 168 hours. Cardiac Enzymes: No results for input(s): CKTOTAL, CKMB, CKMBINDEX, TROPONINI in the last 168 hours. BNP (last 3 results) No results for input(s): PROBNP in the last 8760 hours. HbA1C: No results for input(s): HGBA1C in the last 72 hours. CBG: Recent Labs  Lab 07/22/20 1157 07/22/20 1628 07/22/20 2208 07/23/20 0714 07/23/20 1133  GLUCAP 185* 140* 171* 158* 197*   Lipid Profile: No results for input(s): CHOL, HDL, LDLCALC, TRIG, CHOLHDL, LDLDIRECT in the last 72 hours. Thyroid Function Tests: No results for input(s): TSH, T4TOTAL, FREET4, T3FREE, THYROIDAB in the last 72 hours. Anemia Panel: No results for input(s): VITAMINB12, FOLATE, FERRITIN, TIBC, IRON, RETICCTPCT in the last 72 hours. Sepsis Labs: No results for input(s): PROCALCITON, LATICACIDVEN in the last 168 hours.  Recent Results (from the past 240 hour(s))  C Difficile Quick Screen (NO PCR Reflex)     Status: None   Collection Time: 07/15/20 12:58 PM   Specimen: STOOL  Result Value Ref Range Status   C Diff antigen NEGATIVE NEGATIVE Final   C Diff toxin NEGATIVE NEGATIVE Final   C Diff interpretation NEGATIVE  Final    Comment: Performed at [facility], [address] [place], [place] [address]  Gastrointestinal Panel by PCR , Stool     Status: None   Collection Time: 07/15/20  12:58 PM   Specimen: Stool  Result Value Ref Range Status   Campylobacter species NOT DETECTED NOT DETECTED Final   Plesimonas shigelloides NOT DETECTED NOT DETECTED Final   Salmonella species NOT DETECTED NOT DETECTED Final   Yersinia enterocolitica NOT DETECTED NOT DETECTED Final   Vibrio species NOT DETECTED NOT DETECTED Final   Vibrio cholerae NOT DETECTED NOT DETECTED Final   Enteroaggregative E coli (EAEC) NOT DETECTED NOT DETECTED Final   Enteropathogenic E coli (EPEC) NOT DETECTED NOT DETECTED Final   Enterotoxigenic E coli (ETEC) NOT DETECTED NOT DETECTED Final   Shiga like toxin producing E coli (STEC) NOT DETECTED NOT DETECTED Final   Shigella/Enteroinvasive E coli (EIEC) NOT DETECTED NOT DETECTED Final   Cryptosporidium NOT DETECTED NOT DETECTED Final   Cyclospora cayetanensis NOT DETECTED NOT DETECTED Final   Entamoeba histolytica NOT DETECTED NOT DETECTED Final   Giardia lamblia NOT DETECTED NOT DETECTED Final   Adenovirus F40/41 NOT DETECTED NOT DETECTED Final   Astrovirus NOT DETECTED NOT DETECTED Final   Norovirus GI/GII NOT DETECTED NOT DETECTED Final   Rotavirus A NOT DETECTED NOT DETECTED Final   Sapovirus (I, II, IV, and V) NOT DETECTED NOT DETECTED Final    Comment: Performed at [facility], [address], [place], [place] [address]    Radiology Studies: No results found.  Scheduled Meds: . apixaban  5 mg Oral BID  . atorvastatin  40 mg Oral QHS  . Barium Sulfate  1,000 mL Oral Q1 Hr x 2  . clopidogrel  75 mg Oral Daily  . DULoxetine  20 mg Oral Daily  . feeding supplement  1 Container Oral TID BM  . gabapentin  300 mg Oral BID  . Gerhardt's butt cream   Topical QID  . insulin aspart  0-15 Units Subcutaneous TID WC  . insulin aspart  0-5 Units Subcutaneous QHS  . insulin glargine  10 Units Subcutaneous Daily  . isosorbide mononitrate  15 mg Oral Daily  . latanoprost  1 drop Both Eyes QHS  . metoprolol succinate  12.5 mg Oral Daily  .  oxyCODONE  10 mg Oral  Q12H  . pantoprazole  40 mg Oral BID AC  . polyethylene glycol  17 g Oral BID  . vitamin B-12  1,000 mcg Oral Daily   Continuous Infusions: . sodium chloride 50 mL/hr at 07/23/20 0852     LOS: 13 days    Time spent: 25 mins    Barnell Shieh, MD [facility]   If 7PM-7AM, please contact night-coverage

## 2020-07-24 ENCOUNTER — Inpatient Hospital Stay (HOSPITAL_COMMUNITY): Payer: Medicare Other

## 2020-07-24 ENCOUNTER — Ambulatory Visit: Payer: Medicare Other | Admitting: Cardiology

## 2020-07-24 DIAGNOSIS — R008 Other abnormalities of heart beat: Secondary | ICD-10-CM | POA: Diagnosis not present

## 2020-07-24 LAB — BASIC METABOLIC PANEL
Anion gap: 6 (ref 5–15)
BUN: 12 mg/dL (ref 8–23)
CO2: 25 mmol/L (ref 22–32)
Calcium: 8.9 mg/dL (ref 8.9–10.3)
Chloride: 105 mmol/L (ref 98–111)
Creatinine, Ser: 0.91 mg/dL (ref 0.44–1.00)
GFR, Estimated: >60 mL/min (ref >60)
Glucose, Bld: 104 mg/dL — ABNORMAL HIGH (ref 70–99)
Potassium: 4.5 mmol/L (ref 3.5–5.1)
Sodium: 136 mmol/L (ref 135–145)

## 2020-07-24 LAB — GLUCOSE, CAPILLARY
Glucose-Capillary: 94 mg/dL (ref 70–99)
Glucose-Capillary: 106 mg/dL — ABNORMAL HIGH (ref 70–99)
Glucose-Capillary: 162 mg/dL — ABNORMAL HIGH (ref 70–99)
Glucose-Capillary: 90 mg/dL (ref 70–99)
Glucose-Capillary: 109 mg/dL — ABNORMAL HIGH (ref 70–99)

## 2020-07-24 LAB — PHOSPHORUS: Phosphorus: 4.4 mg/dL (ref 2.5–4.6)

## 2020-07-24 LAB — ECHOCARDIOGRAM LIMITED
Height: 62 in
Weight: 2349.22 oz

## 2020-07-24 LAB — MAGNESIUM: Magnesium: 1.8 mg/dL (ref 1.7–2.4)

## 2020-07-24 MED ORDER — BISACODYL 10 MG RE SUPP
10.0000 mg | Freq: Once | RECTAL | Status: AC
  Administered 2020-07-24: 10 mg via RECTAL
  Filled 2020-07-24: qty 1

## 2020-07-24 MED ORDER — HYDRALAZINE HCL 20 MG/ML IJ SOLN: 5.0000 mg | Freq: Four times a day (QID) | INTRAMUSCULAR | Status: DC | PRN

## 2020-07-24 NOTE — Progress Notes (Signed)
PROGRESS NOTE    Maria Fox  YTK:[id number] DOB: 1945-03-17 DOA: 07/10/2020 PCP: [name], MD   Brief Narrative:  This 75 years old female with PMH significant for CAD, history of CVA, DVT, atrial fibrillation, diastolic heart failure, type 2 diabetes, hypertension, dyslipidemia and autoimmune syndrome who presented with complaints of dysuria and persistent nausea.  Patient also reported colic ,  upper abdominal and bilateral flank pain associated with persistent nausea.  CT abdomen and pelvis with diverticulosis but no diverticulitis.  Patient was admitted for UTI started on IV antibiotics.  She continues to have nausea and vomiting,  further work-up includes stool studies are negative for C. Difficile.  GI was consulted,  Patient was started on antispasmodic agents with antiemetics.  Slowly improving.  No plan for any GI intervention at this point.  Assessment & Plan:   Principal Problem:   Acute cystitis Active Problems:   Hypertension, essential   Hypokalemia   Lower urinary tract infectious disease   Nausea without vomiting   Chronic diastolic CHF (congestive heart failure) ([name])   Stage 3 chronic kidney disease due to type 2 diabetes mellitus ([name])   GERD (gastroesophageal reflux disease)   CAD (coronary artery disease)   Intractable vomiting   Diarrhea   Lower abdominal pain   Acute cystitis/ UTI due Klebsiella pneumonia: Patient has completed antibiotic therapy.  Intractable nausea /functional gastrointestinal disorder. C. difficile negative, stool panel negative. Abdominal x-ray no evidence of obstruction. GI was consulted,  recommended antispasmodic medications. No plans for any EGD at this time. Continue pantoprazole 40 mg twice daily. Continue Reglan and DC Bentyl.  Left Lower quadrant pain: Patient reports left lower quadrant pain associated with constipation. Recent CT abdomen shows diverticulosis without diverticulitis. Patient still has significant  left lower quadrant abdominal pain. repeat CT scan does not show diverticulitis..  Hypertension: Continue metoprolol.  Diastolic heart failure/CAD: Stable,  continue Plavix, Lipitor, metoprolol and Imdur. CT abdomen shows chronic pericardial thickening with high-grade calcification in coronary arteries Cardiology was consulted, awaiting recommendation.  Atrial fibrillation Continue metoprolol, heart rate is controlled.   Continue Eliquis for anticoagulation.  Depression :  continue Cymbalta and trazodone.  Type 2 diabetes:  continue Lantus 10 units daily and sliding scale.  Hyperlipidemia : continue Lipitor.   CKD stage III: renal functions back to normal.  Avoid nephrotoxic medications.  DVT prophylaxis: Lovenox Code Status: Full code Family Communication: No family at bedside Disposition Plan:  Status is: Inpatient  Remains inpatient appropriate because:Inpatient level of care appropriate due to severity of illness   Dispo: The patient is from: Home              Anticipated d/c is to: Home              Patient currently is not medically stable to d/c.   Difficult to place patient No   Consultants:   GI  Procedures:  Antimicrobials:  Anti-infectives (From admission, onward)    Start     Dose/Rate Route Frequency Ordered Stop   07/14/20 1200  cephALEXin (KEFLEX) capsule 500 mg  Status:  Discontinued        500 mg Oral Every 12 hours 07/14/20 1003 07/16/20 1509   07/11/20 1800  cefTRIAXone (ROCEPHIN) 1 g in sodium chloride 0.9 % 100 mL IVPB  Status:  Discontinued        1 g 200 mL/hr over 30 Minutes Intravenous Every 24 hours 07/11/20 0829 07/14/20 1002   07/11/20 0000  aztreonam (  AZACTAM) 1 g in sodium chloride 0.9 % 100 mL IVPB  Status:  Discontinued        1 g 200 mL/hr over 30 Minutes Intravenous Every 8 hours 07/10/20 2252 07/11/20 0829   07/10/20 1930  cefTRIAXone (ROCEPHIN) 1 g in sodium chloride 0.9 % 100 mL IVPB        1 g 200 mL/hr over 30 Minutes  Intravenous  Once 07/10/20 1923 07/10/20 2026       Subjective: Patient was seen and examined at bedside.  She continues to complain about left sided abdominal pain,   Overnight events noted.  Patient reports not having bowel movement in 2 days.  Objective: Vitals:   07/23/20 1319 07/23/20 2209 07/24/20 0519 07/24/20 1337  BP: 126/71 (!) 171/89 135/89 (!) 152/88  Pulse: 75 77 79 75  Resp: 18 20 18 16   Temp: 97.9 F (36.6 C) 98.1 F (36.7 C) 97.9 F (36.6 C) 97.6 F (36.4 C)  TempSrc: Oral Oral Oral Oral  SpO2: 93% 92% 94% 97%  Weight:      Height:        Intake/Output Summary (Last 24 hours) at 07/24/2020 1452 Last data filed at 07/24/2020 0945 Gross per 24 hour  Intake 240 ml  Output 1350 ml  Net -1110 ml   Filed Weights   07/10/20 1434 07/11/20 0943  Weight: 63.8 kg 66.6 kg    Examination:  General exam: Appears calm and comfortable, not in any acute distress. Respiratory system: Clear to auscultation. Respiratory effort normal. Cardiovascular system: S1 & S2 heard, RRR. No JVD, murmurs, rubs, gallops or clicks. No pedal edema. Gastrointestinal system: Abdomen is nondistended, soft and tenderness LLQ. No organomegaly or masses felt.  Normal bowel sounds heard. Central nervous system: Alert and oriented x 3. No focal neurological deficits. Extremities: No edema, no cyanosis, no clubbing. Skin: No rashes, lesions or ulcers Psychiatry: Judgement and insight appear normal. Mood & affect appropriate.     Data Reviewed: I have personally reviewed following labs and imaging studies  CBC: Recent Labs  Lab 07/18/20 1138 07/21/20 0650 07/23/20 0518  WBC 6.2 4.8 5.8  HGB 10.4* 10.3* 11.2*  HCT 32.3* 32.1* 34.5*  MCV 93.4 93.0 92.7  PLT 253 235 [id number]   Basic Metabolic Panel: Recent Labs  Lab 07/18/20 0550 07/19/20 0522 07/20/20 0524 07/21/20 0650 07/21/20 1051 07/22/20 0624 07/23/20 0518 07/23/20 1002 07/24/20 0521  NA 137   < > 136 136  --  136 136  --   136  K 4.6   < > 5.1 5.2* 4.8 4.7 5.2* 5.3* 4.5  CL 104   < > 102 103  --  101 106  --  105  CO2 30   < > 30 30  --  29 24  --  25  GLUCOSE 161*   < > 183* 170*  --  172* 166*  --  104*  BUN 11   < > 13 13  --  12 12  --  12  CREATININE 1.09*   < > 1.10* 1.01*  --  0.90 0.96  --  0.91  CALCIUM 8.9   < > 8.9 8.6*  --  8.8* 8.9  --  8.9  MG 1.7  --   --  1.6*  --  1.9  --   --  1.8  PHOS  --   --   --  3.5  --  3.5  --   --  4.4   < > =  values in this interval not displayed.   GFR: Estimated Creatinine Clearance: 46.3 mL/min (by C-G formula based on SCr of 0.91 mg/dL). Liver Function Tests: No results for input(s): AST, ALT, ALKPHOS, BILITOT, PROT, ALBUMIN in the last 168 hours. No results for input(s): LIPASE, AMYLASE in the last 168 hours. No results for input(s): AMMONIA in the last 168 hours. Coagulation Profile: No results for input(s): INR, PROTIME in the last 168 hours. Cardiac Enzymes: No results for input(s): CKTOTAL, CKMB, CKMBINDEX, TROPONINI in the last 168 hours. BNP (last 3 results) No results for input(s): PROBNP in the last 8760 hours. HbA1C: No results for input(s): HGBA1C in the last 72 hours. CBG: Recent Labs  Lab 07/23/20 1133 07/23/20 1606 07/23/20 2210 07/24/20 0738 07/24/20 1140  GLUCAP 197* 150* 94 106* 162*   Lipid Profile: No results for input(s): CHOL, HDL, LDLCALC, TRIG, CHOLHDL, LDLDIRECT in the last 72 hours. Thyroid Function Tests: No results for input(s): TSH, T4TOTAL, FREET4, T3FREE, THYROIDAB in the last 72 hours. Anemia Panel: No results for input(s): VITAMINB12, FOLATE, FERRITIN, TIBC, IRON, RETICCTPCT in the last 72 hours. Sepsis Labs: No results for input(s): PROCALCITON, LATICACIDVEN in the last 168 hours.  Recent Results (from the past 240 hour(s))  C Difficile Quick Screen (NO PCR Reflex)     Status: None   Collection Time: 07/15/20 12:58 PM   Specimen: STOOL  Result Value Ref Range Status   C Diff antigen NEGATIVE NEGATIVE  Final   C Diff toxin NEGATIVE NEGATIVE Final   C Diff interpretation NEGATIVE  Final    Comment: Performed at [facility], [place] [address], [place], [place] [address]  Gastrointestinal Panel by PCR , Stool     Status: None   Collection Time: 07/15/20 12:58 PM   Specimen: Stool  Result Value Ref Range Status   Campylobacter species NOT DETECTED NOT DETECTED Final   Plesimonas shigelloides NOT DETECTED NOT DETECTED Final   Salmonella species NOT DETECTED NOT DETECTED Final   Yersinia enterocolitica NOT DETECTED NOT DETECTED Final   Vibrio species NOT DETECTED NOT DETECTED Final   Vibrio cholerae NOT DETECTED NOT DETECTED Final   Enteroaggregative E coli (EAEC) NOT DETECTED NOT DETECTED Final   Enteropathogenic E coli (EPEC) NOT DETECTED NOT DETECTED Final   Enterotoxigenic E coli (ETEC) NOT DETECTED NOT DETECTED Final   Shiga like toxin producing E coli (STEC) NOT DETECTED NOT DETECTED Final   Shigella/Enteroinvasive E coli (EIEC) NOT DETECTED NOT DETECTED Final   Cryptosporidium NOT DETECTED NOT DETECTED Final   Cyclospora cayetanensis NOT DETECTED NOT DETECTED Final   Entamoeba histolytica NOT DETECTED NOT DETECTED Final   Giardia lamblia NOT DETECTED NOT DETECTED Final   Adenovirus F40/41 NOT DETECTED NOT DETECTED Final   Astrovirus NOT DETECTED NOT DETECTED Final   Norovirus GI/GII NOT DETECTED NOT DETECTED Final   Rotavirus A NOT DETECTED NOT DETECTED Final   Sapovirus (I, II, IV, and V) NOT DETECTED NOT DETECTED Final    Comment: Performed at [facility], [place] [place], [place] [address]    Radiology Studies: CT ABDOMEN PELVIS WO CONTRAST  Result Date: 07/24/2020 CLINICAL DATA:  Left lower quadrant abdominal pain with nausea and vomiting EXAM: CT ABDOMEN AND PELVIS WITHOUT CONTRAST TECHNIQUE: Multidetector CT imaging of the abdomen and pelvis was performed following the standard protocol without IV contrast. COMPARISON:  07/10/2020  FINDINGS: Lower chest: Small bilateral pleural effusions observed with passive atelectasis. Mild cardiomegaly is present. Abnormal posterior pericardial thickening is  observed with some high density nodular components and components adjacent to the obtuse marginal coronary artery, these have been present since 2016 although particularly the component adjacent to the obtuse marginal coronary artery shows increased thickness over the last year, measuring 1.6 cm in thickness today and measuring only about 0.8 cm in thickness on 07/07/2019. Coronary and descending thoracic aortic atherosclerosis. Small type 1 hiatal hernia. Hepatobiliary: Punctate calcifications compatible with old granulomatous disease. Cholecystectomy. Pancreas: Unremarkable Spleen: Scattered calcifications compatible with old granulomatous disease. Adrenals/Urinary Tract: Unremarkable Stomach/Bowel: Descending duodenal diverticulum. Normal appendix. Scattered descending and sigmoid colon diverticula. Mild prominence of rectal gas and stool. Vascular/Lymphatic: Aortoiliac atherosclerotic vascular disease. Reproductive: Unremarkable Other: Low-level nonspecific presacral edema. Musculoskeletal: Lumbar spondylosis and degenerative disc disease. IMPRESSION: 1. Chronic pericardial thickening posteriorly with a high density component superficial to the circumflex coronary artery, and increased thickening adjacent to the obtuse marginal coronary artery compared to prior exams for example from 2019, and mildly increased from 03/27/2020. Cannot completely exclude a saphenous vein graft aneurysm adjacent to the obtuse marginal coronary artery although this chronic progressive thickening could alternatively be from localized chronic fibrosing pericarditis. Today's CT was performed without IV contrast and is not a dedicated coronary artery CT. The patient had a fairly recent echocardiogram on 07/02/2020 reportedly showing an EF of 60-65%. I would recommend  making the patient's cardiologist aware of the current exam findings along the pericardium/coronary arteries in case further workup is warranted. 2. Mild cardiomegaly. Coronary atherosclerosis. Aortic Atherosclerosis (ICD10-I70.0). 3. Old granulomatous disease. 4. Scattered sigmoid colon and descending colon diverticula. Mild prominence of rectal gas and stool. 5. Lumbar spondylosis and degenerative disc disease. Electronically Signed   By: Van Clines M.D.   On: 07/24/2020 08:14    Scheduled Meds: . apixaban  5 mg Oral BID  . atorvastatin  40 mg Oral QHS  . clopidogrel  75 mg Oral Daily  . DULoxetine  20 mg Oral Daily  . feeding supplement  1 Container Oral TID BM  . gabapentin  300 mg Oral BID  . Gerhardt's butt cream   Topical QID  . insulin aspart  0-15 Units Subcutaneous TID WC  . insulin aspart  0-5 Units Subcutaneous QHS  . insulin glargine  10 Units Subcutaneous Daily  . isosorbide mononitrate  15 mg Oral Daily  . latanoprost  1 drop Both Eyes QHS  . metoprolol succinate  12.5 mg Oral Daily  . oxyCODONE  10 mg Oral Q12H  . pantoprazole  40 mg Oral BID AC  . polyethylene glycol  17 g Oral BID  . vitamin B-12  1,000 mcg Oral Daily   Continuous Infusions: . sodium chloride 50 mL/hr at 07/23/20 2221     LOS: 14 days    Time spent: 25 mins    Zamoria Boss, MD [facility]   If 7PM-7AM, please contact night-coverage
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
## 2020-07-24 NOTE — Progress Notes (Signed)
  Echocardiogram 2D Echocardiogram limited with color and doppler has been performed.  Darlina Sicilian M 07/24/2020, 1:22 PM

## 2020-07-24 NOTE — Progress Notes (Signed)
PT Cancellation Note  Patient Details Name: AVIENDHA AZBELL MRN: [id number] DOB: Sep 18, 1945   Cancelled Treatment:    Reason Eval/Treat Not Completed: Medical issues which prohibited therapy (Pt reports vomiting and refusing adamantly to work with PT. Will check back tomorrow.)   Alvira Philips 07/24/2020, 10:55 AM Calyx Hawker M,PT Acute Rehab Services [phone] (pager)

## 2020-07-24 NOTE — Progress Notes (Signed)
Nutrition Follow-up  INTERVENTION:   -Boost Breeze po TID, each supplement provides 250 kcal and 9 grams of protein  NUTRITION DIAGNOSIS:   Inadequate oral intake related to nausea as evidenced by per patient/family report.  Ongoing.  GOAL:   Patient will meet greater than or equal to 90% of their needs  Not meeting.  MONITOR:   PO intake,Supplement acceptance,Labs,Weight trends,I & O's  ASSESSMENT:   75 year old female with the past medical history for coronary artery disease, history of CVA, DVT, atrial fibrillation, diastolic heart failure, type 2 diabetes mellitus, hypertension, dyslipidemia and autoimmune syndrome who presented with dysuria and persistent nausea. Admitted to the hospital with working diagnosis of acute cystitis, complicated by intractable nausea and vomiting.  5/2: admitted  Patient c/o nausea and constipation. PO intakes have decreased as a result. Still accepting at least 1 [facility] will continue.   Admission weight: 140 lbs. No new weights for admission.  Medications: Miralax, Vitamin B-12, IV Zofran  Labs reviewed:  CBGs: 94-162  Diet Order:   Diet Order            Diet regular Room service appropriate? Yes; Fluid consistency: Thin  Diet effective now                 EDUCATION NEEDS:   No education needs have been identified at this time  Skin:  Skin Assessment: Reviewed RN Assessment  Last BM:  5/13 -type 3  Height:   Ht Readings from Last 1 Encounters:  07/11/20 5\' 2"  (1.575 m)    Weight:   Wt Readings from Last 1 Encounters:  07/11/20 66.6 kg   BMI:  Body mass index is 26.85 kg/m.  Estimated Nutritional Needs:   Kcal:  1600-1800  Protein:  70-85g  Fluid:  1.8L/day  Clayton Bibles, MS, RD, LDN Inpatient Clinical Dietitian Contact information available via Amion

## 2020-07-25 LAB — GLUCOSE, CAPILLARY
Glucose-Capillary: 83 mg/dL (ref 70–99)
Glucose-Capillary: 129 mg/dL — ABNORMAL HIGH (ref 70–99)
Glucose-Capillary: 119 mg/dL — ABNORMAL HIGH (ref 70–99)
Glucose-Capillary: 174 mg/dL — ABNORMAL HIGH (ref 70–99)

## 2020-07-25 MED ORDER — METOCLOPRAMIDE HCL 5 MG PO TABS
5.0000 mg | ORAL_TABLET | Freq: Four times a day (QID) | ORAL | Status: DC | PRN
  Administered 2020-07-25: 5 mg via ORAL
  Filled 2020-07-25: qty 1

## 2020-07-25 MED ORDER — TRAZODONE HCL 50 MG PO TABS
50.0000 mg | ORAL_TABLET | Freq: Once | ORAL | Status: AC
  Administered 2020-07-25: 50 mg via ORAL
  Filled 2020-07-25: qty 1

## 2020-07-25 NOTE — Progress Notes (Signed)
PT Cancellation Note  Patient Details Name: Maria Fox MRN: [id number] DOB: Apr 25, 1945   Cancelled Treatment:    Reason Eval/Treat Not Completed: Medical issues which prohibited therapy (pt is nauseous. RN to give zofran, will reattempt this afternoon.)   [name] PT 07/25/2020  Acute Rehabilitation Services Pager [phone] Office [phone]

## 2020-07-25 NOTE — Progress Notes (Signed)
PT Cancellation Note  Patient Details Name: Maria Fox MRN: [id number] DOB: 1945-04-03   Cancelled Treatment:    Reason Eval/Treat Not Completed: Medical issues which prohibited therapy (pt is nauseous, she stated she's not able to tolerate activity at present. Will follow.)   [name] PT 07/25/2020  Acute Rehabilitation Services Pager [phone] Office [phone]

## 2020-07-25 NOTE — Progress Notes (Signed)
OT Cancellation Note  Patient Details Name: Maria Fox MRN: [id number] DOB: 07/19/1945   Cancelled Treatment:    Reason Eval/Treat Not Completed: Fatigue/lethargy limiting ability to participate. Attempted to see patient in AM however unable to arouse. Will check back as schedule permits.  Delbert Phenix OT OT pager: [place] 07/25/2020, 11:23 AM

## 2020-07-25 NOTE — Progress Notes (Signed)
PROGRESS NOTE    Maria Fox  RCV:[id number] DOB: [birth year] DOA: 07/10/2020 PCP: [name], MD   Brief Narrative:  This 75 years old female with PMH significant for CAD, history of CVA, DVT, atrial fibrillation, diastolic heart failure, type 2 diabetes, hypertension, dyslipidemia and autoimmune syndrome who presented with complaints of dysuria and persistent nausea.  Patient also reported colic ,  upper abdominal and bilateral flank pain associated with persistent nausea.  CT abdomen and pelvis with diverticulosis but no diverticulitis.  Patient was admitted for UTI started on IV antibiotics.  She continues to have nausea and vomiting,  further work-up includes stool studies are negative for C. Difficile.  GI was consulted,  Patient was started on antispasmodic agents with antiemetics.  Slowly improving.  No plan for any GI intervention at this point.  Assessment & Plan:   Principal Problem:   Acute cystitis Active Problems:   Hypertension, essential   Hypokalemia   Lower urinary tract infectious disease   Nausea without vomiting   Chronic diastolic CHF (congestive heart failure) ([name])   Stage 3 chronic kidney disease due to type 2 diabetes mellitus ([name])   GERD (gastroesophageal reflux disease)   CAD (coronary artery disease)   Intractable vomiting   Diarrhea   Lower abdominal pain   Acute cystitis/ UTI due Klebsiella pneumonia: Patient has completed antibiotic therapy.  Intractable nausea /functional gastrointestinal disorder. C. difficile negative, stool panel negative. Abdominal x-ray no evidence of obstruction. GI was consulted,  recommended antispasmodic medications. No plans for any EGD at this time. Continue pantoprazole 40 mg twice daily. Continue Reglan and zofran  and DC Bentyl.  Left Lower quadrant pain: Patient reports left lower quadrant pain associated with constipation. Recent CT abdomen shows diverticulosis without diverticulitis. Patient still has  significant left lower quadrant abdominal pain. repeat CT scan does not show diverticulitis..  Hypertension: Continue metoprolol.  Diastolic heart failure/CAD: Stable,  continue Plavix, Lipitor, metoprolol and Imdur. CT abdomen shows chronic pericardial thickening with high-grade calcification in coronary arteries Cardiology was consulted, no intervention needed at this point. Patient needs outpatient follow-up with cardiology.  Atrial fibrillation Continue metoprolol, heart rate is controlled.   Continue Eliquis for anticoagulation.  Depression :  continue Cymbalta and trazodone.  Type 2 diabetes:  continue Lantus 10 units daily and sliding scale.  Hyperlipidemia : continue Lipitor.   CKD stage III: renal functions back to normal.  Avoid nephrotoxic medications.  DVT prophylaxis: Lovenox Code Status: Full code Family Communication: No family at bedside Disposition Plan:  Status is: Inpatient  Remains inpatient appropriate because:Inpatient level of care appropriate due to severity of illness   Dispo: The patient is from: Home              Anticipated d/c is to: Home              Patient currently is not medically stable to d/c.   Difficult to place patient No   Consultants:   GI  Procedures:  Antimicrobials:  Anti-infectives (From admission, onward)   Start     Dose/Rate Route Frequency Ordered Stop   07/14/20 1200  cephALEXin (KEFLEX) capsule 500 mg  Status:  Discontinued        500 mg Oral Every 12 hours 07/14/20 1003 07/16/20 1509   07/11/20 1800  cefTRIAXone (ROCEPHIN) 1 g in sodium chloride 0.9 % 100 mL IVPB  Status:  Discontinued        1 g 200 mL/hr over 30 Minutes Intravenous Every  24 hours 07/11/20 0829 07/14/20 1002   07/11/20 0000  aztreonam (AZACTAM) 1 g in sodium chloride 0.9 % 100 mL IVPB  Status:  Discontinued        1 g 200 mL/hr over 30 Minutes Intravenous Every 8 hours 07/10/20 2252 07/11/20 0829   07/10/20 1930  cefTRIAXone (ROCEPHIN) 1 g  in sodium chloride 0.9 % 100 mL IVPB        1 g 200 mL/hr over 30 Minutes Intravenous  Once 07/10/20 1923 07/10/20 2026      Subjective: Patient was seen and examined at bedside.  She continues to complain about nausea and sick stomach,  Overnight events noted.  She reports feeling little better, has not participated in physical therapy today.  Objective: Vitals:   07/24/20 2148 07/24/20 2238 07/25/20 0604 07/25/20 1335  BP: (!) 163/88 122/69 117/70 125/70  Pulse: 78 72 85 80  Resp:  16  16  Temp: 97.7 F (36.5 C)  97.8 F (36.6 C) 97.8 F (36.6 C)  TempSrc: Oral  Oral Oral  SpO2: (!) 88%  95% 93%  Weight:      Height:        Intake/Output Summary (Last 24 hours) at 07/25/2020 1518 Last data filed at 07/24/2020 1844 Gross per 24 hour  Intake 240 ml  Output --  Net 240 ml   Filed Weights   07/10/20 1434 07/11/20 0943  Weight: 63.8 kg 66.6 kg    Examination:  General exam: Appears calm and comfortable, not in any acute distress. Respiratory system: Clear to auscultation. Respiratory effort normal. Cardiovascular system: S1 & S2 heard, RRR. No JVD, murmurs, rubs, gallops or clicks. No pedal edema. Gastrointestinal system: Abdomen is nondistended, soft, mild generalized tenderness . No organomegaly or masses felt.  Normal bowel sounds heard. Central nervous system: Alert and oriented x 3. No focal neurological deficits. Extremities: No edema, no cyanosis, no clubbing. Skin: No rashes, lesions or ulcers Psychiatry: Judgement and insight appear normal. Mood & affect appropriate.     Data Reviewed: I have personally reviewed following labs and imaging studies  CBC: Recent Labs  Lab 07/21/20 0650 07/23/20 0518  WBC 4.8 5.8  HGB 10.3* 11.2*  HCT 32.1* 34.5*  MCV 93.0 92.7  PLT 235 341   Basic Metabolic Panel: Recent Labs  Lab 07/20/20 0524 07/21/20 0650 07/21/20 1051 07/22/20 0624 07/23/20 0518 07/23/20 1002 07/24/20 0521  NA 136 136  --  136 136  --   136  K 5.1 5.2* 4.8 4.7 5.2* 5.3* 4.5  CL 102 103  --  101 106  --  105  CO2 30 30  --  29 24  --  25  GLUCOSE 183* 170*  --  172* 166*  --  104*  BUN 13 13  --  12 12  --  12  CREATININE 1.10* 1.01*  --  0.90 0.96  --  0.91  CALCIUM 8.9 8.6*  --  8.8* 8.9  --  8.9  MG  --  1.6*  --  1.9  --   --  1.8  PHOS  --  3.5  --  3.5  --   --  4.4   GFR: Estimated Creatinine Clearance: 46.3 mL/min (by C-G formula based on SCr of 0.91 mg/dL). Liver Function Tests: No results for input(s): AST, ALT, ALKPHOS, BILITOT, PROT, ALBUMIN in the last 168 hours. No results for input(s): LIPASE, AMYLASE in the last 168 hours. No results for input(s): AMMONIA in the  last 168 hours. Coagulation Profile: No results for input(s): INR, PROTIME in the last 168 hours. Cardiac Enzymes: No results for input(s): CKTOTAL, CKMB, CKMBINDEX, TROPONINI in the last 168 hours. BNP (last 3 results) No results for input(s): PROBNP in the last 8760 hours. HbA1C: No results for input(s): HGBA1C in the last 72 hours. CBG: Recent Labs  Lab 07/24/20 1140 07/24/20 1603 07/24/20 2151 07/25/20 0751 07/25/20 1141  GLUCAP 162* 90 109* 83 129*   Lipid Profile: No results for input(s): CHOL, HDL, LDLCALC, TRIG, CHOLHDL, LDLDIRECT in the last 72 hours. Thyroid Function Tests: No results for input(s): TSH, T4TOTAL, FREET4, T3FREE, THYROIDAB in the last 72 hours. Anemia Panel: No results for input(s): VITAMINB12, FOLATE, FERRITIN, TIBC, IRON, RETICCTPCT in the last 72 hours. Sepsis Labs: No results for input(s): PROCALCITON, LATICACIDVEN in the last 168 hours.  No results found for this or any previous visit (from the past 240 hour(s)).  Radiology Studies: CT ABDOMEN PELVIS WO CONTRAST  Result Date: 07/24/2020 CLINICAL DATA:  Left lower quadrant abdominal pain with nausea and vomiting EXAM: CT ABDOMEN AND PELVIS WITHOUT CONTRAST TECHNIQUE: Multidetector CT imaging of the abdomen and pelvis was performed following the  standard protocol without IV contrast. COMPARISON:  07/10/2020 FINDINGS: Lower chest: Small bilateral pleural effusions observed with passive atelectasis. Mild cardiomegaly is present. Abnormal posterior pericardial thickening is observed with some high density nodular components and components adjacent to the obtuse marginal coronary artery, these have been present since 2016 although particularly the component adjacent to the obtuse marginal coronary artery shows increased thickness over the last year, measuring 1.6 cm in thickness today and measuring only about 0.8 cm in thickness on 07/07/2019. Coronary and descending thoracic aortic atherosclerosis. Small type 1 hiatal hernia. Hepatobiliary: Punctate calcifications compatible with old granulomatous disease. Cholecystectomy. Pancreas: Unremarkable Spleen: Scattered calcifications compatible with old granulomatous disease. Adrenals/Urinary Tract: Unremarkable Stomach/Bowel: Descending duodenal diverticulum. Normal appendix. Scattered descending and sigmoid colon diverticula. Mild prominence of rectal gas and stool. Vascular/Lymphatic: Aortoiliac atherosclerotic vascular disease. Reproductive: Unremarkable Other: Low-level nonspecific presacral edema. Musculoskeletal: Lumbar spondylosis and degenerative disc disease. IMPRESSION: 1. Chronic pericardial thickening posteriorly with a high density component superficial to the circumflex coronary artery, and increased thickening adjacent to the obtuse marginal coronary artery compared to prior exams for example from 2019, and mildly increased from 03/27/2020. Cannot completely exclude a saphenous vein graft aneurysm adjacent to the obtuse marginal coronary artery although this chronic progressive thickening could alternatively be from localized chronic fibrosing pericarditis. Today's CT was performed without IV contrast and is not a dedicated coronary artery CT. The patient had a fairly recent echocardiogram on  07/02/2020 reportedly showing an EF of 60-65%. I would recommend making the patient's cardiologist aware of the current exam findings along the pericardium/coronary arteries in case further workup is warranted. 2. Mild cardiomegaly. Coronary atherosclerosis. Aortic Atherosclerosis (ICD10-I70.0). 3. Old granulomatous disease. 4. Scattered sigmoid colon and descending colon diverticula. Mild prominence of rectal gas and stool. 5. Lumbar spondylosis and degenerative disc disease. Electronically Signed   By: Van Clines M.D.   On: 07/24/2020 08:14   ECHOCARDIOGRAM LIMITED  Result Date: 07/24/2020    ECHOCARDIOGRAM LIMITED REPORT   Patient Name:   Maria Fox Date of Exam: 07/24/2020 Medical Rec #:  [id number]    Height:       62.0 in Accession #:    [id number]   Weight:       146.8 lb Date of Birth:  [birth year]    BSA:  1.676 m Patient Age:    51 years     BP:           135/89 mmHg Patient Gender: F            HR:           79 bpm. Exam Location:  Inpatient Procedure: Limited Echo, Color Doppler and Cardiac Doppler Indications:    Other abnormalities of the heart R00.8  History:        Patient has prior history of Echocardiogram examinations, most                 recent 07/02/2020. CAD and Previous Myocardial Infarction, Prior                 CABG, Arrythmias:RBBB; Risk Factors:Hypertension, Diabetes and                 Dyslipidemia. History of Aflutter, hypotension.  Sonographer:    Darlina Sicilian RDCS Referring Phys: [place]  1. Left ventricular ejection fraction, by estimation, is 65 to 70%. The left ventricle has normal function.  2. Right ventricular systolic function is normal. The right ventricular size is normal.  3. The mitral valve is normal in structure. Trivial mitral valve regurgitation.  4. The aortic valve is normal in structure. Aortic valve regurgitation is not visualized. Comparison(s): The left ventricular function is unchanged. FINDINGS  Left Ventricle: Left  ventricular ejection fraction, by estimation, is 65 to 70%. The left ventricle has normal function. Right Ventricle: The right ventricular size is normal. Right vetricular wall thickness was not assessed. Right ventricular systolic function is normal. Left Atrium: Left atrial size was normal in size. Right Atrium: Right atrial size was normal in size. Pericardium: There is no evidence of pericardial effusion. Mitral Valve: The mitral valve is normal in structure. Mild mitral annular calcification. Trivial mitral valve regurgitation. Tricuspid Valve: The tricuspid valve is normal in structure. Tricuspid valve regurgitation is trivial. Aortic Valve: The aortic valve is normal in structure. Aortic valve regurgitation is not visualized. Pulmonic Valve: The pulmonic valve was grossly normal. Pulmonic valve regurgitation is not visualized. Aorta: The aortic root is normal in size and structure. LEFT VENTRICLE PLAX 2D LVOT diam:     1.80 cm LVOT Area:     2.54 cm   SHUNTS Systemic Diam: 1.80 cm [name] MD Electronically signed by [name] MD Signature Date/Time: 07/24/2020/4:31:10 PM    Final     Scheduled Meds: . apixaban  5 mg Oral BID  . atorvastatin  40 mg Oral QHS  . clopidogrel  75 mg Oral Daily  . DULoxetine  20 mg Oral Daily  . feeding supplement  1 Container Oral TID BM  . gabapentin  300 mg Oral BID  . Gerhardt's butt cream   Topical QID  . insulin aspart  0-15 Units Subcutaneous TID WC  . insulin aspart  0-5 Units Subcutaneous QHS  . insulin glargine  10 Units Subcutaneous Daily  . isosorbide mononitrate  15 mg Oral Daily  . latanoprost  1 drop Both Eyes QHS  . metoprolol succinate  12.5 mg Oral Daily  . oxyCODONE  10 mg Oral Q12H  . pantoprazole  40 mg Oral BID AC  . polyethylene glycol  17 g Oral BID  . vitamin B-12  1,000 mcg Oral Daily   Continuous Infusions: . sodium chloride 50 mL/hr at 07/24/20 1941     LOS: 15 days    Time  spent: 25 mins    Shawna Clamp, MD [facility]   If 7PM-7AM, please contact night-coverage

## 2020-07-26 LAB — GLUCOSE, CAPILLARY
Glucose-Capillary: 108 mg/dL — ABNORMAL HIGH (ref 70–99)
Glucose-Capillary: 253 mg/dL — ABNORMAL HIGH (ref 70–99)
Glucose-Capillary: 178 mg/dL — ABNORMAL HIGH (ref 70–99)
Glucose-Capillary: 121 mg/dL — ABNORMAL HIGH (ref 70–99)

## 2020-07-26 NOTE — Progress Notes (Signed)
PROGRESS NOTE    Maria Fox  XAJ:[id number] DOB: 1945/11/26 DOA: 07/10/2020 PCP: [name], MD   Brief Narrative:  This 75 years old female with PMH significant for CAD, history of CVA, DVT, atrial fibrillation, diastolic heart failure, type 2 diabetes, hypertension, dyslipidemia and autoimmune syndrome who presented with complaints of dysuria and persistent nausea.  Patient also reported colic ,  upper abdominal and bilateral flank pain associated with persistent nausea.  CT abdomen and pelvis with diverticulosis but no diverticulitis.  Patient was admitted for UTI started on IV antibiotics.  She continues to have nausea and vomiting,  further work-up includes stool studies are negative for C. Difficile.  GI was consulted,  Patient was started on antispasmodic agents with antiemetics.  Slowly improving.  No plan for any GI intervention at this point.  Assessment & Plan:   Principal Problem:   Acute cystitis Active Problems:   Hypertension, essential   Hypokalemia   Lower urinary tract infectious disease   Nausea without vomiting   Chronic diastolic CHF (congestive heart failure) ([name])   Stage 3 chronic kidney disease due to type 2 diabetes mellitus ([name])   GERD (gastroesophageal reflux disease)   CAD (coronary artery disease)   Intractable vomiting   Diarrhea   Lower abdominal pain   Acute cystitis/ UTI due Klebsiella pneumonia: Patient has completed antibiotic therapy.  Intractable nausea /functional gastrointestinal disorder. C. difficile negative, stool panel negative. Abdominal x-ray no evidence of obstruction. GI was consulted,  recommended antispasmodic medications. No plans for any EGD at this time. Continue pantoprazole 40 mg twice daily. Continue Reglan and zofran  and DC Bentyl. She continued to complain about nausea.  Left Lower quadrant pain: Patient reports left lower quadrant pain associated with constipation. Recent CT abdomen shows diverticulosis  without diverticulitis. Patient still has significant left lower quadrant abdominal pain. repeat CT scan does not show diverticulitis.. Patient still complains of having left lower quadrant abdominal pain.  Hypertension: Continue metoprolol.  Diastolic heart failure/CAD: Stable,  continue Plavix, Lipitor, metoprolol and Imdur. CT abdomen shows chronic pericardial thickening with high-grade calcification in coronary arteries Cardiology was consulted, no intervention needed at this point. Patient needs outpatient follow-up with cardiology.  Atrial fibrillation Continue metoprolol, heart rate is controlled.   Continue Eliquis for anticoagulation.  Depression :  continue Cymbalta and trazodone.  Type 2 diabetes:  continue Lantus 10 units daily and sliding scale.  Hyperlipidemia : continue Lipitor.   CKD stage III: renal functions back to normal.  Avoid nephrotoxic medications.  DVT prophylaxis: Lovenox Code Status: Full code Family Communication: No family at bedside Disposition Plan:  Status is: Inpatient  Remains inpatient appropriate because:Inpatient level of care appropriate due to severity of illness   Dispo: The patient is from: Home              Anticipated d/c is to: Home with HHS               Patient currently is not medically stable to d/c.   Difficult to place patient No   Consultants:   GI  Procedures:  Antimicrobials:  Anti-infectives (From admission, onward)   Start     Dose/Rate Route Frequency Ordered Stop   07/14/20 1200  cephALEXin (KEFLEX) capsule 500 mg  Status:  Discontinued        500 mg Oral Every 12 hours 07/14/20 1003 07/16/20 1509   07/11/20 1800  cefTRIAXone (ROCEPHIN) 1 g in sodium chloride 0.9 % 100 mL IVPB  Status:  Discontinued        1 g 200 mL/hr over 30 Minutes Intravenous Every 24 hours 07/11/20 0829 07/14/20 1002   07/11/20 0000  aztreonam (AZACTAM) 1 g in sodium chloride 0.9 % 100 mL IVPB  Status:  Discontinued        1  g 200 mL/hr over 30 Minutes Intravenous Every 8 hours 07/10/20 2252 07/11/20 0829   07/10/20 1930  cefTRIAXone (ROCEPHIN) 1 g in sodium chloride 0.9 % 100 mL IVPB        1 g 200 mL/hr over 30 Minutes Intravenous  Once 07/10/20 1923 07/10/20 2026      Subjective: Patient was seen and examined at bedside.  She continues to complain about nausea and sick stomach,  Overnight events noted.  She has participated in physical therapy, recommended home with home services. Patient stated she is not feeling good and does not want to be discharged before she feels better  Objective: Vitals:   07/25/20 0604 07/25/20 1335 07/25/20 2130 07/26/20 0605  BP: 117/70 125/70 135/73 130/63  Pulse: 85 80 73 76  Resp:  16 14 14   Temp: 97.8 F (36.6 C) 97.8 F (36.6 C) 97.7 F (36.5 C) 97.8 F (36.6 C)  TempSrc: Oral Oral Oral Oral  SpO2: 95% 93% 94% 95%  Weight:      Height:        Intake/Output Summary (Last 24 hours) at 07/26/2020 1457 Last data filed at 07/25/2020 1845 Gross per 24 hour  Intake 2846.19 ml  Output 500 ml  Net 2346.19 ml   Filed Weights   07/10/20 1434 07/11/20 0943  Weight: 63.8 kg 66.6 kg    Examination:  General exam: Appears calm and comfortable, not in any acute distress. Respiratory system: Clear to auscultation. Respiratory effort normal. Cardiovascular system: S1 & S2 heard, RRR. No JVD, murmurs, rubs, gallops or clicks. No pedal edema. Gastrointestinal system: Abdomen is nondistended, soft, mild generalized tenderness . No organomegaly or masses felt.   Normal bowel sounds heard. Central nervous system: Alert and oriented x 3. No focal neurological deficits. Extremities: No edema, no cyanosis, no clubbing. Skin: No rashes, lesions or ulcers Psychiatry: Judgement and insight appear normal. Mood & affect appropriate.     Data Reviewed: I have personally reviewed following labs and imaging studies  CBC: Recent Labs  Lab 07/21/20 0650 07/23/20 0518  WBC 4.8  5.8  HGB 10.3* 11.2*  HCT 32.1* 34.5*  MCV 93.0 92.7  PLT 235 025   Basic Metabolic Panel: Recent Labs  Lab 07/20/20 0524 07/21/20 0650 07/21/20 1051 07/22/20 0624 07/23/20 0518 07/23/20 1002 07/24/20 0521  NA 136 136  --  136 136  --  136  K 5.1 5.2* 4.8 4.7 5.2* 5.3* 4.5  CL 102 103  --  101 106  --  105  CO2 30 30  --  29 24  --  25  GLUCOSE 183* 170*  --  172* 166*  --  104*  BUN 13 13  --  12 12  --  12  CREATININE 1.10* 1.01*  --  0.90 0.96  --  0.91  CALCIUM 8.9 8.6*  --  8.8* 8.9  --  8.9  MG  --  1.6*  --  1.9  --   --  1.8  PHOS  --  3.5  --  3.5  --   --  4.4   GFR: Estimated Creatinine Clearance: 46.3 mL/min (by C-G formula based on SCr  of 0.91 mg/dL). Liver Function Tests: No results for input(s): AST, ALT, ALKPHOS, BILITOT, PROT, ALBUMIN in the last 168 hours. No results for input(s): LIPASE, AMYLASE in the last 168 hours. No results for input(s): AMMONIA in the last 168 hours. Coagulation Profile: No results for input(s): INR, PROTIME in the last 168 hours. Cardiac Enzymes: No results for input(s): CKTOTAL, CKMB, CKMBINDEX, TROPONINI in the last 168 hours. BNP (last 3 results) No results for input(s): PROBNP in the last 8760 hours. HbA1C: No results for input(s): HGBA1C in the last 72 hours. CBG: Recent Labs  Lab 07/25/20 1141 07/25/20 1651 07/25/20 2132 07/26/20 0729 07/26/20 1133  GLUCAP 129* 119* 174* 108* 253*   Lipid Profile: No results for input(s): CHOL, HDL, LDLCALC, TRIG, CHOLHDL, LDLDIRECT in the last 72 hours. Thyroid Function Tests: No results for input(s): TSH, T4TOTAL, FREET4, T3FREE, THYROIDAB in the last 72 hours. Anemia Panel: No results for input(s): VITAMINB12, FOLATE, FERRITIN, TIBC, IRON, RETICCTPCT in the last 72 hours. Sepsis Labs: No results for input(s): PROCALCITON, LATICACIDVEN in the last 168 hours.  No results found for this or any previous visit (from the past 240 hour(s)).  Radiology Studies: No results  found.  Scheduled Meds: . apixaban  5 mg Oral BID  . atorvastatin  40 mg Oral QHS  . clopidogrel  75 mg Oral Daily  . DULoxetine  20 mg Oral Daily  . feeding supplement  1 Container Oral TID BM  . gabapentin  300 mg Oral BID  . Gerhardt's butt cream   Topical QID  . insulin aspart  0-15 Units Subcutaneous TID WC  . insulin aspart  0-5 Units Subcutaneous QHS  . insulin glargine  10 Units Subcutaneous Daily  . isosorbide mononitrate  15 mg Oral Daily  . latanoprost  1 drop Both Eyes QHS  . metoprolol succinate  12.5 mg Oral Daily  . oxyCODONE  10 mg Oral Q12H  . pantoprazole  40 mg Oral BID AC  . polyethylene glycol  17 g Oral BID  . vitamin B-12  1,000 mcg Oral Daily   Continuous Infusions:    LOS: 16 days    Time spent: 25 mins    Shawna Clamp, MD [facility]   If 7PM-7AM, please contact night-coverage

## 2020-07-26 NOTE — Progress Notes (Signed)
Occupational Therapy Treatment  With encouragement patient agreeable to grooming/hygiene at sink. Patient supervision in standing to perform oral care, wash face and hands. Min G assist for safety with x1 minor loss of balance trying to navigate obstacles in room ambulating to bedside commode to void. Patient set up A for peri care after voiding and min G to transfer to recliner. Patient asking to get back to bed however after education/encouragement to increase out of bed activity tolerance patient agreeable. Continue with POC.    07/26/20 1129  OT Visit Information  Last OT Received On 07/26/20  Assistance Needed +1  History of Present Illness 75 yo female admittred with acute cystitis, dysuria, nausea, N/V/D. Hx of CAD, CVA, DVT, MDD, Afib, HF, DM, COVID  Precautions  Precautions Fall  Pain Assessment  Pain Assessment Faces  Faces Pain Scale 4  Pain Location stomach/L side  Pain Descriptors / Indicators Aching (nausea)  Pain Intervention(s) Monitored during session  Cognition  Arousal/Alertness Awake/alert  Behavior During Therapy WFL for tasks assessed/performed  Overall Cognitive Status Within Functional Limits for tasks assessed  ADL  Overall ADL's  Needs assistance/impaired  Grooming Oral care;Wash/dry face;Wash/dry hands;Supervision/safety;Standing  Surveyor, minerals guard;Ambulation;BSC  Toilet Transfer Details (indicate cue type and reason) patient pushing IV pole, min G for safety due to x1 loss of balance tryign to navigate obstacles in room  Toileting- Water quality scientist and Hygiene Supervision/safety;Sitting/lateral lean  Toileting - Clothing Manipulation Details (indicate cue type and reason) peri care after voiding  Functional mobility during ADLs Min guard;Cueing for safety  General ADL Comments patient needing encouragement to sit up in recliner chair/increase OOB activity tolerance  Bed Mobility  Overal bed mobility Modified Independent  Bed Mobility Supine  to Sit  Balance  Overall balance assessment Needs assistance  Sitting-balance support Feet supported  Sitting balance-Leahy Scale Good  Standing balance support Single extremity supported;During functional activity;No upper extremity supported  Standing balance-Leahy Scale Fair  Standing balance comment can stand statically at sink without UE support, x1 minor loss of balance with unilateral support in walking  Transfers  Overall transfer level Needs assistance  Equipment used  (IV pole)  Transfers Sit to/from [employer] Transfers  Sit to Stand Min guard  Stand pivot transfers Min guard  General transfer comment min G for safety as patient mildy unsteady and x1 minor loss of balance with ambulating around obstacles in room  OT - End of Session  Activity Tolerance Patient tolerated treatment well  Patient left in chair;with call bell/phone within reach;with chair alarm set  Nurse Communication Mobility status  OT Assessment/Plan  OT Plan Discharge plan remains appropriate  OT Visit Diagnosis Unsteadiness on feet (R26.81);Pain  Pain - part of body  (abdomen)  OT Frequency (ACUTE ONLY) Min 2X/week  Follow Up Recommendations Supervision/Assistance - 24 hour;Home health OT  OT Equipment Other (comment) (rolling walker)  AM-PAC OT "6 Clicks" Daily Activity Outcome Measure (Version 2)  Help from another person eating meals? 4  Help from another person taking care of personal grooming? 3  Help from another person toileting, which includes using toliet, bedpan, or urinal? 3  Help from another person bathing (including washing, rinsing, drying)? 3  Help from another person to put on and taking off regular upper body clothing? 3  Help from another person to put on and taking off regular lower body clothing? 3  6 Click Score 19  OT Goal Progression  Progress towards OT goals Progressing toward goals  Acute Rehab  OT Goals  Patient Stated Goal feel better  OT Goal Formulation With  patient  Time For Goal Achievement 07/31/20  Potential to Achieve Goals Good  ADL Goals  Pt Will Perform Lower Body Dressing with modified independence  Pt Will Transfer to Toilet with modified independence  Pt Will Perform Toileting - Clothing Manipulation and hygiene with modified independence  Additional ADL Goal #1 Pt will engage in 8 min standing functional activities without loss of balance, in order to demonstrate improved activity tolerance and balance needed to perform ADLs safely at home.  OT Time Calculation  OT Start Time (ACUTE ONLY) [id number]  OT Stop Time (ACUTE ONLY) 0948  OT Time Calculation (min) 26 min  OT General Charges  $OT Visit 1 Visit  OT Treatments  $Self Care/Home Management  23-37 mins   Delbert Phenix OT OT pager: [phone]

## 2020-07-26 NOTE — Progress Notes (Signed)
Physical Therapy Treatment Patient Details Name: Maria Fox MRN: [id number] DOB: 1945/06/30 Today's Date: 07/26/2020    History of Present Illness 75 yo female admittred with acute cystitis, dysuria, nausea, N/V/D. Hx of CAD, CVA, DVT, MDD, Afib, HF, DM, COVID    PT Comments    Pt requiring SUPV today for bed mobility, reliant on UEs to assist in uprighting trunk and scooting out to EOB. Pt requiring min guard for transfers with HHA, slightly impulsive trying to get to [facility], requiring VCs for hand placement and upright posture prior to pivoting for safety. Pt completes pericare with supv for safety. Pt ambulates 100 ft with RW in hallway, VCs to maintain body within RW frame, position in center of hallway to avoid walls, requiring increased time with turns, slightly drifty both R and L equally that improves with VCs. Updated recommendations to RW need, encouraged pt on time OOB but pt declines and returns to supine in bed due to nausea complaints.    Follow Up Recommendations  Home health PT;Supervision - Intermittent     Equipment Recommendations  Rolling walker with 5" wheels    Recommendations for Other Services       Precautions / Restrictions Precautions Precautions: Fall Restrictions Weight Bearing Restrictions: No    Mobility  Bed Mobility Overal bed mobility: Needs Assistance Bed Mobility: Supine to Sit;Sit to Supine  Supine to sit: Supervision;HOB elevated Sit to supine: Supervision General bed mobility comments: use of UEs on bedrail, HOB elevated, requires UEs assisting to scoot to EOB    Transfers Overall transfer level: Needs assistance Equipment used: 1 person hand held assist Transfers: Sit to/from Stand;Stand Pivot Transfers Sit to Stand: Min guard Stand pivot transfers: Min assist  General transfer comment: min G to power to stand and min A to steady with pivot to [facility], VCs for hand placement to improve safety  Ambulation/Gait Ambulation/Gait assistance:  Min guard Gait Distance (Feet): 100 Feet Assistive device: Rolling walker (2 wheeled) Gait Pattern/deviations: Step-through pattern;Decreased stride length;Drifts right/left Gait velocity: decreased   General Gait Details: VCs to maintain body within RW frame and position RW in center of hallway away from walls, slightly drifty R/L that improve with VCs, wide turn requiring increased time, no LOB   Stairs             Wheelchair Mobility    Modified Rankin (Stroke Patients Only)       Balance   Sitting-balance support: Feet supported Sitting balance-Leahy Scale: Good Sitting balance - Comments: seated EOB   Standing balance support: During functional activity Standing balance-Leahy Scale: Poor Standing balance comment: reliant on UE support     Cognition Arousal/Alertness: Awake/alert Behavior During Therapy: WFL for tasks assessed/performed Overall Cognitive Status: Within Functional Limits for tasks assessed         Exercises      General Comments        Pertinent Vitals/Pain Pain Assessment: Faces Faces Pain Scale: Hurts a little bit Pain Location: stomach Pain Intervention(s): Limited activity within patient's tolerance;Monitored during session    Home Living                      Prior Function            PT Goals (current goals can now be found in the care plan section) Acute Rehab PT Goals Patient Stated Goal: feel better PT Goal Formulation: With patient Time For Goal Achievement: 07/31/20 Potential to Achieve Goals: Good Progress towards  PT goals: Progressing toward goals    Frequency    Min 3X/week      PT Plan Current plan remains appropriate;Equipment recommendations need to be updated    Co-evaluation              AM-PAC PT "6 Clicks" Mobility   Outcome Measure  Help needed turning from your back to your side while in a flat bed without using bedrails?: A Little Help needed moving from lying on your back to  sitting on the side of a flat bed without using bedrails?: A Little Help needed moving to and from a bed to a chair (including a wheelchair)?: A Little Help needed standing up from a chair using your arms (e.g., wheelchair or bedside chair)?: A Little Help needed to walk in hospital room?: A Little Help needed climbing 3-5 steps with a railing? : A Little 6 Click Score: 18    End of Session Equipment Utilized During Treatment: Gait belt Activity Tolerance: Patient tolerated treatment well Patient left: in bed;with call bell/phone within reach;with bed alarm set Nurse Communication: Mobility status PT Visit Diagnosis: Muscle weakness (generalized) (M62.81);Unsteadiness on feet (R26.81)     Time: 8889-1694 PT Time Calculation (min) (ACUTE ONLY): 22 min  Charges:  $Gait Training: 8-22 mins                      Tori Saquoia Sianez PT, DPT 07/26/20, 9:41 AM

## 2020-07-27 LAB — GLUCOSE, CAPILLARY
Glucose-Capillary: 112 mg/dL — ABNORMAL HIGH (ref 70–99)
Glucose-Capillary: 166 mg/dL — ABNORMAL HIGH (ref 70–99)

## 2020-07-27 MED ORDER — FUROSEMIDE 20 MG PO TABS: 20.0000 mg | ORAL_TABLET | Freq: Every day | ORAL | Status: DC | PRN

## 2020-07-27 MED ORDER — POLYETHYLENE GLYCOL 3350 17 G PO PACK: 17.0000 g | PACK | Freq: Every day | ORAL | 0 refills | Status: DC | PRN

## 2020-07-27 MED ORDER — OXYCODONE HCL 5 MG PO TABS: 5.0000 mg | ORAL_TABLET | Freq: Four times a day (QID) | ORAL | 0 refills | Status: DC | PRN

## 2020-07-27 NOTE — Progress Notes (Signed)
Pt discharged home with significant other in stable condition. Discharge instructions and script given. No immediate questions or concerns at this time. Discharged from unit via wheelchair.

## 2020-07-27 NOTE — Discharge Summary (Signed)
Physician Discharge Summary  JAZZ BIDDY BTD:[id number] DOB: 31-Aug-1945 DOA: 07/10/2020  PCP: [name], MD  Admit date: 07/10/2020 Discharge date: 07/27/2020  Admitted From: Home  Disposition:  Home   Recommendations for Outpatient Follow-up and new medication changes:  1. Follow up with Dr. [name] in 7 to 10 days.  2. Continue pain control with as needed oxycodone.  3. Added as needed Miralax for constipation. 4. Change furosemide to as needed for edema.    Home Health: no (declined)   Equipment/Devices: no    Discharge Condition: stable  CODE STATUS: full  Diet recommendation: heart healthy   Brief/Interim Summary: [name] was admitted to the hospital with working diagnosis of acute cystitis, complicated by intractable nausea and vomiting.  75 year old femalewith thepast medical history for coronary artery disease, history of CVA, DVT, atrial fibrillation, diastolic heart failure, type 2 diabetes mellitus, hypertension, dyslipidemia and autoimmune syndrome who presented with dysuria and persistent nausea.She reported colicky suprapubic and bilateral flanks pain,associated with persistent nausea. On her initial physical examination temperature 97.7, blood pressure 167/96, heart rate 91, respiratory rate 24, oxygen saturation 92% on room air. She had dry mucous membranes, lungs clear to auscultation bilaterally, heart S1-S2, present, rhythmic, abdomen tender at the suprapubic region, no lower extremity edema.  Sodium 136, potassium 3.3, chloride 104, bicarb 23, glucose 239, BUN 13, creatinine 1.0, white count 7.2, hemoglobin 13.5, hematocrit 40.8, platelet count 340. SARS COVID-19 negative.  Urinalysis specific gravity 1.023, 30 protein, 21-50 red cells,>50 white cells. CT abdomen pelvis with diverticulosis, no diverticulitis, no renal calculi or obstruction.  Patient was placed on intravenous antibiotics and supportive intravenous fluids. Further work-up with  stool studies was negative for C. difficile. Due to persistent nausea, GI was consulted. She was placed on antispasmodic agents along with antiemetics  She had a prolonged hospitalization with slow recovery, no further nausea or vomiting, continue to have abdominal pain but improved with analgesics.  Patient declined home health.   1. Acute cystitis, urinary tract infection, Klebsiella pneumonia, present on admission.  No sepsis. Patient was admitted to the medical ward, she received IV antibiotic with good toleration, she completed her therapy while hospitalized. Urine culture positive for Klebsiella pneumonia, resistant to ampicillin and intermediate to nitrofurantoin, sensitive to cephalosporins.  2. Intractable nausea, abdominal pain, functional gastrointestinal disorder.  Patient had a prolonged hospitalization, she underwent further work-up with stool studies which were negative including for C. Difficile. Repeat CT of the abdomen with scattered sigmoid colon and descending colon diverticuli, no diverticulitis. Scattered calcification in the spleen compatible with chronic granulomatous disease. Lumbar spondylolysis and degenerative disc disease  Patient received antiemetics and analgesics, her diet was advanced with good toleration.  At discharge she continued to have left lower quadrant abdominal pain which is improved with analgesics, no further nausea or vomiting.  She has been recommended to follow-up with her primary care provider within 7 to 10 days.  3.  Hypertension/coronary artery disease (post CABG), diastolic heart failure.  Patient remained chest pain-free. CT of the abdomen showed incidental chronic pericardial thickening with a high density component superficial to the circumflex coronary artery and increased thickening adjacent to the obtuse marginal coronary artery compared to prior exam.  Cardiology recommended outpatient follow-up, likely pericardial imaging changes due  to staples from vein graft and not pericarditis. The only graft she has since LIMA to LAD.  Vein graft to OM is occluded. Continue with clopidogrel, isosorbide and atorvastatin.   No signs of heart failure decompensation,  change furosemide to as needed for signs of hypervolemia.  4.  Paroxysmal atrial fibrillation.  Patient remained rate controlled metoprolol, continue anticoagulation with apixaban.  5.  Chronic kidney disease stage IIIa, hypokalemia, hyponatremia, hypomagnesemia.  Patient received intravenous fluids with good toleration, her electrolytes were corrected. At discharge furosemide will be continued only as needed to prevent further electrolyte abnormalities.  6.  Type 2 diabetes mellitus, dyslipidemia.  Patient had uncontrolled hyperglycemia, she received insulin sliding scale along with basal insulin. At discharge resume glipizide and dapagliflozin.  Basal insulin with 10 units glargine.   Continue statin therapy.  7.  Depression.  Continue duloxetine and trazodone.  8.  Chronic anemia.  Hemoglobin and hematocrit stable, no need for blood product transfusion.  Discharge Diagnoses:  Principal Problem:   Acute cystitis Active Problems:   Hypertension, essential   Hypokalemia   Lower urinary tract infectious disease   Nausea without vomiting   Chronic diastolic CHF (congestive heart failure) ([name])   Stage 3 chronic kidney disease due to type 2 diabetes mellitus ([name])   GERD (gastroesophageal reflux disease)   CAD (coronary artery disease)   Intractable vomiting   Diarrhea   Lower abdominal pain    Discharge Instructions   Allergies as of 07/27/2020      Reactions   Bee Venom    Throat swelling   Mushroom Extract Complex Anaphylaxis   Penicillins Anaphylaxis, Hives, Swelling   Tolerates cephalosporins including cephalexin multiple times.  Has patient had a PCN reaction causing immediate rash, facial/tongue/throat swelling, SOB or lightheadedness with  hypotension: Yes Has patient had a PCN reaction causing severe rash involving mucus membranes or skin necrosis: Yes Has patient had a PCN reaction that required hospitalization Yes Has patient had a PCN reaction occurring within the last 10 years: No   Codeine Nausea And Vomiting   Sulfa Antibiotics Nausea And Vomiting   Iohexol    Erythromycin Base Rash      Medication List    TAKE these medications   acetaminophen 500 MG tablet Commonly known as: TYLENOL Take 500 mg by mouth as needed for moderate pain.   albuterol 108 (90 Base) MCG/ACT inhaler Commonly known as: ProAir HFA Inhale 2 puffs into the lungs every 6 (six) hours as needed for wheezing or shortness of breath.   apixaban 5 MG Tabs tablet Commonly known as: ELIQUIS Take 1 tablet (5 mg total) by mouth 2 (two) times daily.   atorvastatin 40 MG tablet Commonly known as: LIPITOR TAKE 1 TABLET BY MOUTH EVERYDAY AT BEDTIME What changed:   how much to take  how to take this  when to take this  additional instructions   clopidogrel 75 MG tablet Commonly known as: PLAVIX Take 1 tablet (75 mg total) by mouth daily.   dapagliflozin propanediol 5 MG Tabs tablet Commonly known as: FARXIGA Take 5 mg by mouth daily.   docusate sodium 100 MG capsule Commonly known as: COLACE Take 1 capsule (100 mg total) by mouth daily. What changed:   when to take this  reasons to take this   DULoxetine 20 MG capsule Commonly known as: CYMBALTA Take 20 mg by mouth daily.   furosemide 20 MG tablet Commonly known as: LASIX Take 1 tablet (20 mg total) by mouth daily as needed for fluid or edema (leg swelling). What changed:   when to take this  reasons to take this   gabapentin 300 MG capsule Commonly known as: NEURONTIN Take 1 capsule (300 mg total) by  mouth 2 (two) times daily.   glipiZIDE 5 MG tablet Commonly known as: GLUCOTROL TAKE 1 TABLET (5 MG TOTAL) BY MOUTH 2 (TWO) TIMES DAILY BEFORE A MEAL. What changed:    how much to take  how to take this  when to take this  additional instructions   isosorbide mononitrate 30 MG 24 hr tablet Commonly known as: IMDUR Take 0.5 tablets (15 mg total) by mouth daily.   Lantus SoloStar 100 UNIT/ML Solostar Pen Generic drug: insulin glargine Inject 10 Units into the skin daily.   latanoprost 0.005 % ophthalmic solution Commonly known as: XALATAN Place 1 drop into both eyes at bedtime.   metoCLOPramide 10 MG tablet Commonly known as: REGLAN Take 1 tablet (10 mg total) by mouth every 6 (six) hours as needed for nausea or vomiting.   metoprolol succinate 25 MG 24 hr tablet Commonly known as: Toprol XL Take 0.5 tablets (12.5 mg total) by mouth daily.   nitroGLYCERIN 0.4 MG SL tablet Commonly known as: NITROSTAT Place 1 tablet (0.4 mg total) under the tongue every 5 (five) minutes x 3 doses as needed for chest pain.   ondansetron 4 MG disintegrating tablet Commonly known as: Zofran ODT Take 1 tablet (4 mg total) by mouth every 8 (eight) hours as needed for nausea or vomiting.   ONE TOUCH ULTRA 2 w/Device Kit Use as instructed to check blood sugar 2 times daily as needed.   OneTouch Delica Lancets 40N Misc Use as directed to check blood sugar 2 times daily as needed   OneTouch Ultra test strip Generic drug: glucose blood Use as instructed to check blood sugar 2 times daily as needed   oxyCODONE 5 MG immediate release tablet Commonly known as: Oxy IR/ROXICODONE Take 1 tablet (5 mg total) by mouth every 6 (six) hours as needed for severe pain.   pantoprazole 40 MG tablet Commonly known as: PROTONIX Take 1 tablet (40 mg total) by mouth 2 (two) times daily.   polyethylene glycol 17 g packet Commonly known as: MIRALAX / GLYCOLAX Take 17 g by mouth daily as needed for moderate constipation.   Vitamin B12 500 MCG Tabs Take 2 tablets by mouth daily.       Allergies  Allergen Reactions  . Bee Venom     Throat swelling  . Mushroom  Extract Complex Anaphylaxis  . Penicillins Anaphylaxis, Hives and Swelling    Tolerates cephalosporins including cephalexin multiple times.  Has patient had a PCN reaction causing immediate rash, facial/tongue/throat swelling, SOB or lightheadedness with hypotension: Yes Has patient had a PCN reaction causing severe rash involving mucus membranes or skin necrosis: Yes Has patient had a PCN reaction that required hospitalization Yes Has patient had a PCN reaction occurring within the last 10 years: No    . Codeine Nausea And Vomiting  . Sulfa Antibiotics Nausea And Vomiting  . Iohexol   . Erythromycin Base Rash    Consultations:  Cardiology   GI    Procedures/Studies: CT ABDOMEN PELVIS WO CONTRAST  Result Date: 07/24/2020 CLINICAL DATA:  Left lower quadrant abdominal pain with nausea and vomiting EXAM: CT ABDOMEN AND PELVIS WITHOUT CONTRAST TECHNIQUE: Multidetector CT imaging of the abdomen and pelvis was performed following the standard protocol without IV contrast. COMPARISON:  07/10/2020 FINDINGS: Lower chest: Small bilateral pleural effusions observed with passive atelectasis. Mild cardiomegaly is present. Abnormal posterior pericardial thickening is observed with some high density nodular components and components adjacent to the obtuse marginal coronary artery, these have been  present since 2016 although particularly the component adjacent to the obtuse marginal coronary artery shows increased thickness over the last year, measuring 1.6 cm in thickness today and measuring only about 0.8 cm in thickness on 07/07/2019. Coronary and descending thoracic aortic atherosclerosis. Small type 1 hiatal hernia. Hepatobiliary: Punctate calcifications compatible with old granulomatous disease. Cholecystectomy. Pancreas: Unremarkable Spleen: Scattered calcifications compatible with old granulomatous disease. Adrenals/Urinary Tract: Unremarkable Stomach/Bowel: Descending duodenal diverticulum. Normal  appendix. Scattered descending and sigmoid colon diverticula. Mild prominence of rectal gas and stool. Vascular/Lymphatic: Aortoiliac atherosclerotic vascular disease. Reproductive: Unremarkable Other: Low-level nonspecific presacral edema. Musculoskeletal: Lumbar spondylosis and degenerative disc disease. IMPRESSION: 1. Chronic pericardial thickening posteriorly with a high density component superficial to the circumflex coronary artery, and increased thickening adjacent to the obtuse marginal coronary artery compared to prior exams for example from 2019, and mildly increased from 03/27/2020. Cannot completely exclude a saphenous vein graft aneurysm adjacent to the obtuse marginal coronary artery although this chronic progressive thickening could alternatively be from localized chronic fibrosing pericarditis. Today's CT was performed without IV contrast and is not a dedicated coronary artery CT. The patient had a fairly recent echocardiogram on 07/02/2020 reportedly showing an EF of 60-65%. I would recommend making the patient's cardiologist aware of the current exam findings along the pericardium/coronary arteries in case further workup is warranted. 2. Mild cardiomegaly. Coronary atherosclerosis. Aortic Atherosclerosis (ICD10-I70.0). 3. Old granulomatous disease. 4. Scattered sigmoid colon and descending colon diverticula. Mild prominence of rectal gas and stool. 5. Lumbar spondylosis and degenerative disc disease. Electronically Signed   By: Van Clines M.D.   On: 07/24/2020 08:14   CT ABDOMEN PELVIS WO CONTRAST  Result Date: 07/10/2020 CLINICAL DATA:  Acute abdominal pain EXAM: CT ABDOMEN AND PELVIS WITHOUT CONTRAST TECHNIQUE: Multidetector CT imaging of the abdomen and pelvis was performed following the standard protocol without IV contrast. COMPARISON:  03/27/2020 FINDINGS: Lower chest: No acute abnormality. Chronic pericardial thickening is noted. Hepatobiliary: No focal liver abnormality is seen.  Status post cholecystectomy. No biliary dilatation. Pancreas: Unremarkable. No pancreatic ductal dilatation or surrounding inflammatory changes. Spleen: Multiple calcified granulomas are noted Adrenals/Urinary Tract: Adrenal glands are within normal limits. No renal calculi are noted. No obstructive changes are seen. The bladder is partially distended. Stomach/Bowel: The appendix is within normal limits. No obstructive or inflammatory changes of the colon are seen. Mild diverticular changes noted without diverticulitis. Small bowel and stomach appear within normal limits. Vascular/Lymphatic: Aortic atherosclerosis. No enlarged abdominal or pelvic lymph nodes. Reproductive: Uterus and bilateral adnexa are unremarkable. Other: No abdominal wall hernia or abnormality. No abdominopelvic ascites. Musculoskeletal: No acute or significant osseous findings. IMPRESSION: Diverticulosis without diverticulitis. Changes of prior granulomatous disease. Chronic thickening of the pericardium. This is stable dating back to 2016 and likely related to prior cardiac surgery. Electronically Signed   By: Inez Catalina M.D.   On: 07/10/2020 16:50   DG Chest Portable 1 View  Result Date: 07/01/2020 CLINICAL DATA:  Chest pain EXAM: PORTABLE CHEST 1 VIEW COMPARISON:  03/27/2020 FINDINGS: Post CABG changes. Loop recorder device is present. Stable cardiomediastinal contours. Atherosclerotic calcification of the aortic knob. A skin fold projects over the right upper lobe. Mild bibasilar atelectasis. No appreciable pleural fluid collection. No pneumothorax. IMPRESSION: Mild bibasilar atelectasis. Electronically Signed   By: Davina Poke D.O.   On: 07/01/2020 11:27   DG Abd Portable 1V  Result Date: 07/18/2020 CLINICAL DATA:  Abdominal pain, nausea EXAM: PORTABLE ABDOMEN - 1 VIEW COMPARISON:  CT 07/10/2020 FINDINGS:  Nonobstructive bowel gas pattern. Moderate stool burden throughout the colon. Prior cholecystectomy. No organomegaly or  free air. IMPRESSION: Moderate stool burden.  No acute findings. Electronically Signed   By: Rolm Baptise M.D.   On: 07/18/2020 12:01   ECHOCARDIOGRAM COMPLETE  Result Date: 07/02/2020    ECHOCARDIOGRAM REPORT   Patient Name:   Maria Fox Date of Exam: 07/02/2020 Medical Rec #:  [id number]    Height:       62.5 in Accession #:    [id number]   Weight:       140.7 lb Date of Birth:  1946/02/19    BSA:          1.656 m Patient Age:    38 years     BP:           116/72 mmHg Patient Gender: F            HR:           81 bpm. Exam Location:  Inpatient Procedure: 2D Echo Indications:    chest pain  History:        Patient has prior history of Echocardiogram examinations, most                 recent 07/11/2019. CHF, Prior CABG; Risk Factors:Diabetes,                 Hypertension and Dyslipidemia.  Sonographer:    Johny Chess Referring Phys: [place]  1. Left ventricular ejection fraction, by estimation, is 60 to 65%. The left ventricle has normal function. The left ventricle has no regional wall motion abnormalities. There is mild left ventricular hypertrophy. Left ventricular diastolic parameters are consistent with Grade I diastolic dysfunction (impaired relaxation). Elevated left atrial pressure.  2. Right ventricular systolic function is normal. The right ventricular size is normal. There is mildly elevated pulmonary artery systolic pressure.  3. The mitral valve is normal in structure. Mild mitral valve regurgitation. No evidence of mitral stenosis.  4. Tricuspid valve regurgitation is mild to moderate.  5. The aortic valve is tricuspid. Aortic valve regurgitation is not visualized. No aortic stenosis is present.  6. The inferior vena cava is normal in size with greater than 50% respiratory variability, suggesting right atrial pressure of 3 mmHg. FINDINGS  Left Ventricle: Left ventricular ejection fraction, by estimation, is 60 to 65%. The left ventricle has normal function. The left  ventricle has no regional wall motion abnormalities. The left ventricular internal cavity size was normal in size. There is  mild left ventricular hypertrophy. Left ventricular diastolic parameters are consistent with Grade I diastolic dysfunction (impaired relaxation). Elevated left atrial pressure. Right Ventricle: The right ventricular size is normal. Right ventricular systolic function is normal. There is mildly elevated pulmonary artery systolic pressure. The tricuspid regurgitant velocity is 3.12 m/s, and with an assumed right atrial pressure of 3 mmHg, the estimated right ventricular systolic pressure is 25.0 mmHg. Left Atrium: Left atrial size was normal in size. Right Atrium: Right atrial size was normal in size. Pericardium: Trivial pericardial effusion is present. Mitral Valve: The mitral valve is normal in structure. Mild mitral valve regurgitation. No evidence of mitral valve stenosis. Tricuspid Valve: The tricuspid valve is normal in structure. Tricuspid valve regurgitation is mild to moderate. No evidence of tricuspid stenosis. Aortic Valve: The aortic valve is tricuspid. Aortic valve regurgitation is not visualized. No aortic stenosis is present. Pulmonic Valve: The pulmonic valve was normal in structure.  Pulmonic valve regurgitation is not visualized. No evidence of pulmonic stenosis. Aorta: The aortic root is normal in size and structure. Venous: The inferior vena cava is normal in size with greater than 50% respiratory variability, suggesting right atrial pressure of 3 mmHg.   LEFT VENTRICLE PLAX 2D LVIDd:         3.30 cm  Diastology LVIDs:         2.50 cm  LV e' medial:    5.44 cm/s LV PW:         1.20 cm  LV E/e' medial:  20.6 LV IVS:        1.10 cm  LV e' lateral:   4.35 cm/s LVOT diam:     1.80 cm  LV E/e' lateral: 25.7 LV SV:         40 LV SV Index:   24 LVOT Area:     2.54 cm  RIGHT VENTRICLE            IVC RV S prime:     9.90 cm/s  IVC diam: 1.30 cm TAPSE (M-mode): 1.4 cm LEFT ATRIUM              Index       RIGHT ATRIUM           Index LA diam:        3.30 cm 1.99 cm/m  RA Area:     11.00 cm LA Vol (A2C):   27.6 ml 16.67 ml/m RA Volume:   21.20 ml  12.80 ml/m LA Vol (A4C):   42.0 ml 25.36 ml/m LA Biplane Vol: 34.7 ml 20.96 ml/m  AORTIC VALVE LVOT Vmax:   78.80 cm/s LVOT Vmean:  52.000 cm/s LVOT VTI:    0.158 m  AORTA Ao Root diam: 3.20 cm Ao Asc diam:  3.00 cm MITRAL VALVE                TRICUSPID VALVE MV Area (PHT): 2.87 cm     TR Peak grad:   38.9 mmHg MV Decel Time: 264 msec     TR Vmax:        312.00 cm/s MV E velocity: 112.00 cm/s MV A velocity: 113.00 cm/s  SHUNTS MV E/A ratio:  0.99         Systemic VTI:  0.16 m                             Systemic Diam: 1.80 cm Kirk Ruths MD Electronically signed by Kirk Ruths MD Signature Date/Time: 07/02/2020/12:13:31 PM    Final    ECHOCARDIOGRAM LIMITED  Result Date: 07/24/2020    ECHOCARDIOGRAM LIMITED REPORT   Patient Name:   Maria Fox Date of Exam: 07/24/2020 Medical Rec #:  [id number]    Height:       62.0 in Accession #:    [id number]   Weight:       146.8 lb Date of Birth:  1946/01/23    BSA:          1.676 m Patient Age:    23 years     BP:           135/89 mmHg Patient Gender: F            HR:           79 bpm. Exam Location:  Inpatient Procedure: Limited Echo, Color Doppler and Cardiac Doppler Indications:    Other abnormalities  of the heart R00.8  History:        Patient has prior history of Echocardiogram examinations, most                 recent 07/02/2020. CAD and Previous Myocardial Infarction, Prior                 CABG, Arrythmias:RBBB; Risk Factors:Hypertension, Diabetes and                 Dyslipidemia. History of Aflutter, hypotension.  Sonographer:    Darlina Sicilian RDCS Referring Phys: [place]  1. Left ventricular ejection fraction, by estimation, is 65 to 70%. The left ventricle has normal function.  2. Right ventricular systolic function is normal. The right ventricular size is normal.   3. The mitral valve is normal in structure. Trivial mitral valve regurgitation.  4. The aortic valve is normal in structure. Aortic valve regurgitation is not visualized. Comparison(s): The left ventricular function is unchanged. FINDINGS  Left Ventricle: Left ventricular ejection fraction, by estimation, is 65 to 70%. The left ventricle has normal function. Right Ventricle: The right ventricular size is normal. Right vetricular wall thickness was not assessed. Right ventricular systolic function is normal. Left Atrium: Left atrial size was normal in size. Right Atrium: Right atrial size was normal in size. Pericardium: There is no evidence of pericardial effusion. Mitral Valve: The mitral valve is normal in structure. Mild mitral annular calcification. Trivial mitral valve regurgitation. Tricuspid Valve: The tricuspid valve is normal in structure. Tricuspid valve regurgitation is trivial. Aortic Valve: The aortic valve is normal in structure. Aortic valve regurgitation is not visualized. Pulmonic Valve: The pulmonic valve was grossly normal. Pulmonic valve regurgitation is not visualized. Aorta: The aortic root is normal in size and structure. LEFT VENTRICLE PLAX 2D LVOT diam:     1.80 cm LVOT Area:     2.54 cm   SHUNTS Systemic Diam: 1.80 cm [name] MD Electronically signed by [name] MD Signature Date/Time: 07/24/2020/4:31:10 PM    Final        Subjective: Patient is feeling better, no nausea or vomiting, continue to have left lower abdominal pain but improved with analgesics, no chest pain or dyspnea.   Discharge Exam: Vitals:   07/26/20 2202 07/27/20 0522  BP: 112/65 138/82  Pulse: 81 81  Resp: 19 18  Temp: 98.5 F (36.9 C) 98.2 F (36.8 C)  SpO2: 91% 90%   Vitals:   07/25/20 2130 07/26/20 0605 07/26/20 2202 07/27/20 0522  BP: 135/73 130/63 112/65 138/82  Pulse: 73 76 81 81  Resp: _0 Temp: 97.7 F (36.5 C) 97.8 F (36.6 C) 98.5 F (36.9 C) 98.2 F (36.8 C)  TempSrc:  Oral Oral Oral Oral  SpO2: 94% 95% 91% 90%  Weight:      Height:        General: Not in pain or dyspnea.  Neurology: Awake and alert, non focal  E ENT: no pallor, no icterus, oral mucosa moist Cardiovascular: No JVD. S1-S2 present, rhythmic, no gallops, rubs, or murmurs. No lower extremity edema. Pulmonary: positive breath sounds bilaterally, adequate air movement, no wheezing, rhonchi or rales. Gastrointestinal. Abdomen soft and non tender Skin. No rashes Musculoskeletal: no joint deformities   The results of significant diagnostics from this hospitalization (including imaging, microbiology, ancillary and laboratory) are listed below for reference.     Microbiology: No results found for this or any previous visit (from the past  240 hour(s)).   Labs: BNP (last 3 results) No results for input(s): BNP in the last 8760 hours. Basic Metabolic Panel: Recent Labs  Lab 07/21/20 0650 07/21/20 1051 07/22/20 0624 07/23/20 0518 07/23/20 1002 07/24/20 0521  NA 136  --  136 136  --  136  K 5.2* 4.8 4.7 5.2* 5.3* 4.5  CL 103  --  101 106  --  105  CO2 30  --  29 24  --  25  GLUCOSE 170*  --  172* 166*  --  104*  BUN 13  --  12 12  --  12  CREATININE 1.01*  --  0.90 0.96  --  0.91  CALCIUM 8.6*  --  8.8* 8.9  --  8.9  MG 1.6*  --  1.9  --   --  1.8  PHOS 3.5  --  3.5  --   --  4.4   Liver Function Tests: No results for input(s): AST, ALT, ALKPHOS, BILITOT, PROT, ALBUMIN in the last 168 hours. No results for input(s): LIPASE, AMYLASE in the last 168 hours. No results for input(s): AMMONIA in the last 168 hours. CBC: Recent Labs  Lab 07/21/20 0650 07/23/20 0518  WBC 4.8 5.8  HGB 10.3* 11.2*  HCT 32.1* 34.5*  MCV 93.0 92.7  PLT 235 256   Cardiac Enzymes: No results for input(s): CKTOTAL, CKMB, CKMBINDEX, TROPONINI in the last 168 hours. BNP: Invalid input(s): POCBNP CBG: Recent Labs  Lab 07/26/20 0729 07/26/20 1133 07/26/20 1626 07/26/20 2204 07/27/20 0755   GLUCAP 108* 253* 178* 121* 112*   D-Dimer No results for input(s): DDIMER in the last 72 hours. Hgb A1c No results for input(s): HGBA1C in the last 72 hours. Lipid Profile No results for input(s): CHOL, HDL, LDLCALC, TRIG, CHOLHDL, LDLDIRECT in the last 72 hours. Thyroid function studies No results for input(s): TSH, T4TOTAL, T3FREE, THYROIDAB in the last 72 hours.  Invalid input(s): FREET3 Anemia work up No results for input(s): VITAMINB12, FOLATE, FERRITIN, TIBC, IRON, RETICCTPCT in the last 72 hours. Urinalysis    Component Value Date/Time   COLORURINE YELLOW 07/10/2020 1823   APPEARANCEUR HAZY (A) 07/10/2020 1823   LABSPEC 1.023 07/10/2020 1823   PHURINE 5.0 07/10/2020 1823   GLUCOSEU 150 (A) 07/10/2020 1823   HGBUR MODERATE (A) 07/10/2020 1823   BILIRUBINUR NEGATIVE 07/10/2020 1823   BILIRUBINUR negative 06/13/2020 1552   KETONESUR 20 (A) 07/10/2020 1823   PROTEINUR 30 (A) 07/10/2020 1823   UROBILINOGEN 0.2 06/13/2020 1552   UROBILINOGEN 0.2 09/16/2014 0009   NITRITE POSITIVE (A) 07/10/2020 1823   LEUKOCYTESUR MODERATE (A) 07/10/2020 1823   Sepsis Labs Invalid input(s): PROCALCITONIN,  WBC,  LACTICIDVEN Microbiology No results found for this or any previous visit (from the past 240 hour(s)).   Time coordinating discharge: 45 minutes  SIGNED:   Tawni Millers, MD  [facility] 07/27/2020, 9:27 AM

## 2020-07-28 ENCOUNTER — Telehealth: Payer: Self-pay

## 2020-07-28 NOTE — Telephone Encounter (Signed)
Agree with restarting metformin ER 1000mg  daily.  Hold if GI upset or nausea develops.

## 2020-07-28 NOTE — Chronic Care Management (AMB) (Addendum)
Chronic Care Management Pharmacy Assistant   Name: Maria Fox  MRN: [id number] DOB: 10-Sep-1945  Reason for Encounter: Medication Adherence and Delivery Coordination. Discharge Medication Rec  Recent office visits:  06/13/20- Dr. Danise Fox- PCP- Increased Duloxetine 20 mg to 30 mg daily. Increased pantoprazole from 20 mg to 40 mg. Encouraged to start insulin 10 units daily. Consider updated DEXA next year. Follow up in 3 months.  Recent consult visits:  None since last CCM contact   Hospital visits:  Medication Reconciliation was completed by comparing discharge summary, patient's EMR and Pharmacy list, and upon discussion with patient.  Admitted to the hospital on 07/10/20 due to Acute Cystitis. Discharge date was 07/27/20. Discharged from [facility]. Readmitted  5/21-5/28.  07/10/20-07/27/20 New?Medications Started at [facility] Discharge:?? -started oxycodone 57m short course PRN and polyethylene glycol. Start Basal insulin with 10 units glargine  Medication Changes at Hospital Discharge: -Changed furosemide 20 mg from daily to daily as needed for fluid or edema. Resumed Maria Fox  Medications Discontinued at Hospital Discharge: -No medications discontinued.  Medications that remain the same after Hospital Discharge:??  -All other medications will remain the same.    [facility] oral doxycyline 7 day course  Medication Changes at Hospital Discharge: -Scheduled metoclopramide QID before meals and bedtime, metformin on hold during hospital but resumed at discharge per PCP  Medications Discontinued at Hospital Discharge: -No medications discontinued.  Medications that remain the same after Hospital Discharge:??  -All other medications will remain the same.    Medications: Outpatient Encounter Medications as of 07/28/2020  Medication Sig Note   acetaminophen (TYLENOL) 500 MG tablet Take 500 mg by mouth as  needed for moderate pain.    albuterol (PROAIR HFA) 108 (90 Base) MCG/ACT inhaler Inhale 2 puffs into the lungs every 6 (six) hours as needed for wheezing or shortness of breath.    apixaban (ELIQUIS) 5 MG TABS tablet Take 1 tablet (5 mg total) by mouth 2 (two) times daily.    atorvastatin (LIPITOR) 40 MG tablet TAKE 1 TABLET BY MOUTH EVERYDAY AT BEDTIME (Patient taking differently: Take 40 mg by mouth at bedtime.)    Blood Glucose Monitoring Suppl (ONE TOUCH ULTRA 2) w/Device KIT Use as instructed to check blood sugar 2 times daily as needed.    clopidogrel (PLAVIX) 75 MG tablet Take 1 tablet (75 mg total) by mouth daily.    Cyanocobalamin (VITAMIN B12) 500 MCG TABS Take 2 tablets by mouth daily.    dapagliflozin propanediol (FARXIGA) 5 MG TABS tablet Take 5 mg by mouth daily.    docusate sodium (COLACE) 100 MG capsule Take 1 capsule (100 mg total) by mouth daily. (Patient taking differently: Take 100 mg by mouth daily as needed for mild constipation.) 07/01/2020: Takes as needed   DULoxetine (CYMBALTA) 20 MG capsule Take 20 mg by mouth daily.    furosemide (LASIX) 20 MG tablet Take 1 tablet (20 mg total) by mouth daily as needed for fluid or edema (leg swelling).    gabapentin (NEURONTIN) 300 MG capsule Take 1 capsule (300 mg total) by mouth 2 (two) times daily.    glipiZIDE (GLUCOTROL) 5 MG tablet TAKE 1 TABLET (5 MG TOTAL) BY MOUTH 2 (TWO) TIMES DAILY BEFORE A MEAL. (Patient taking differently: Take 5 mg by mouth 2 (two) times daily before a meal.)    glucose blood (ONETOUCH ULTRA) test strip Use as instructed to check blood sugar 2 times daily as needed  insulin glargine (LANTUS SOLOSTAR) 100 UNIT/ML Solostar Pen Inject 10 Units into the skin daily.    isosorbide mononitrate (IMDUR) 30 MG 24 hr tablet Take 0.5 tablets (15 mg total) by mouth daily.    latanoprost (XALATAN) 0.005 % ophthalmic solution Place 1 drop into both eyes at bedtime.     metoCLOPramide (REGLAN) 10 MG tablet Take 1 tablet  (10 mg total) by mouth every 6 (six) hours as needed for nausea or vomiting.    metoprolol succinate (TOPROL XL) 25 MG 24 hr tablet Take 0.5 tablets (12.5 mg total) by mouth daily.    nitroGLYCERIN (NITROSTAT) 0.4 MG SL tablet Place 1 tablet (0.4 mg total) under the tongue every 5 (five) minutes x 3 doses as needed for chest pain.    ondansetron (ZOFRAN ODT) 4 MG disintegrating tablet Take 1 tablet (4 mg total) by mouth every 8 (eight) hours as needed for nausea or vomiting.    OneTouch Delica Lancets 00Q MISC Use as directed to check blood sugar 2 times daily as needed    oxyCODONE (OXY IR/ROXICODONE) 5 MG immediate release tablet Take 1 tablet (5 mg total) by mouth every 6 (six) hours as needed for severe pain.    pantoprazole (PROTONIX) 40 MG tablet Take 1 tablet (40 mg total) by mouth 2 (two) times daily. (Patient taking differently: Take 40 mg by mouth 2 (two) times daily.)    polyethylene glycol (MIRALAX / GLYCOLAX) 17 g packet Take 17 g by mouth daily as needed for moderate constipation.    No facility-administered encounter medications on file as of 07/28/2020.   BP Readings from Last 3 Encounters:  07/27/20 138/82  07/04/20 (!) 108/46  06/13/20 120/76    Lab Results  Component Value Date   HGBA1C 11.6 (H) 07/03/2020    Attempted contact with Maria Fox 3 times. Unsuccessful outreach.   Recent OV, Consult or Hospital visit:   07/10/20-07/27/20, 5/21-28/22 Hospitalization 06/13/20- Dr. Danise Fox  Recent medication changes indicated:   07/10/20 started oxycodone 71m and polyethylene glycol. Start Basal insulin with 10 units glargine Changed furosemide 20 mg from daily to  daily as needed for fluid or edema. Resumed Maria Fox 06/13/20-  Increased Duloxetine 20 mg to 30 mg daily. Increased pantoprazole from 20 mg to 40 mg. Encouraged to start insulin 10 units daily  Patient obtains medications through Adherence Packaging  30 Days   Last adherence delivery date:  06/16/20  Patient is due  for next adherence delivery on:  07/18/20 (delayed due to patient being in hospital from 07/10/20 - 07/27/20, 07/29/20-08/05/20)  This delivery to include: Adherence Packaging  30 Days  Packs: Farxiga 575m1 tablet daily (1 breakfast) Gabapentin 30059m tablet twice daily (1 breakfast, 1 evening meal) Pantoprazole 24m81mtablet twice daily (1 breakfast, 1 evening meal) Atorvastatin 24mg71mablet daily (1 breakfast) Isosorbide mono 30 mg 1/2 tablet daily (1/2 breakfast) Clopidogrel 75mg 73mblet daily (1 breakfast) Metoprolol 25mg  30mtablet daily (1/2 breakfast) Duloxetine 30mg 1 20mule daily (1 breakfast) - PCP increased 06/13/20 Glipizide 5mg 1 ta62mt twice daily (1 breakfast, 1 evening meal) Metformin 500mg 2 ta74ms at breakfast (2 breakfast) Eliquis 5mg 1 tabl45mtwice daily (1 breakfast, 1 evening meal)  PRN/VIAL medications: Furosemide 20mg 1 tabl71maily as needed for swelling Doxycyline 100 mg 1 tablet every 12 hours for 7 days Zofran 4 mg ODT as needed Metoclopramide - take 4 times daily   Patient not due for the following medications this month: One  touch lancets  One touch strips Latanoprost - due 6/222/22 Lantus solostar- Inject 10 units into the skin daily, pt never started this so she has excess supply Albuterol inhaler PRN Not taking: trazodone, tramadol, Basaglar, Ozempic - these have been discontinued  Unable to reach patient by telephone.   Follow-Up:  Pharmacist Review  [name], CPP notified  Margaretmary Dys, [place] Assistant [phone]  I have reviewed the care management and care coordination activities outlined in this encounter and I am certifying that I agree with the content of this note. Reviewed discharge notes in detail and was able to discuss changes with patient.  [name], PharmD Clinical Pharmacist [facility] [phone]

## 2020-07-28 NOTE — Telephone Encounter (Signed)
Transition Care Management Unsuccessful Follow-up Telephone Call  Date of discharge and from where:  07/27/2020, [facility]  Attempts:  3rd Attempt  Reason for unsuccessful TCM follow-up call:  Left voice message

## 2020-07-28 NOTE — Telephone Encounter (Signed)
Transition Care Management Unsuccessful Follow-up Telephone Call  Date of discharge and from where:  07/27/2020, [facility]  Attempts:  2nd Attempt  Reason for unsuccessful TCM follow-up call:  No answer/busy

## 2020-07-28 NOTE — Telephone Encounter (Signed)
Late entry: called @ 10:30 am today  Transition Care Management Unsuccessful Follow-up Telephone Call  Date of discharge and from where:  07/27/2020, [facility]   Attempts:  1st Attempt  Reason for unsuccessful TCM follow-up call:  Left voice message

## 2020-07-28 NOTE — Telephone Encounter (Signed)
Patient has been off metformin since hospital admission on 07/01/20, held due to AKI. Metformin not given during admission 5/2-5/17. It was not mentioned in discharge notes. Her GFR is back up > 60. Do you want her to continue holding metformin until next PCP visit or okay to resume metformin ER 1000 mg/day?   Lab Results  Component Value Date   CREATININE 0.91 07/24/2020   BUN 12 07/24/2020   GFR 37.02 (L) 05/02/2020   GFRNONAA >60 07/24/2020   GFRAA 41 (L) 01/28/2020   NA 136 07/24/2020   K 4.5 07/24/2020   CALCIUM 8.9 07/24/2020   CO2 25 07/24/2020   [name], PharmD Clinical Pharmacist [facility] [phone]

## 2020-07-29 ENCOUNTER — Inpatient Hospital Stay (HOSPITAL_COMMUNITY)
Admission: EM | Admit: 2020-07-29 | Discharge: 2020-08-05 | DRG: 074 | Disposition: A | Discharge status: Home / Self Care | Payer: Medicare Other | Attending: Family Medicine | Admitting: Family Medicine

## 2020-07-29 ENCOUNTER — Encounter (HOSPITAL_COMMUNITY): Payer: Self-pay

## 2020-07-29 ENCOUNTER — Emergency Department (HOSPITAL_COMMUNITY): Payer: Medicare Other

## 2020-07-29 ENCOUNTER — Other Ambulatory Visit: Payer: Self-pay

## 2020-07-29 DIAGNOSIS — R112 Nausea with vomiting, unspecified: Secondary | ICD-10-CM | POA: Diagnosis present

## 2020-07-29 DIAGNOSIS — I130 Hypertensive heart and chronic kidney disease with heart failure and stage 1 through stage 4 chronic kidney disease, or unspecified chronic kidney disease: Secondary | ICD-10-CM | POA: Diagnosis not present

## 2020-07-29 DIAGNOSIS — Z951 Presence of aortocoronary bypass graft: Secondary | ICD-10-CM

## 2020-07-29 DIAGNOSIS — Z83438 Family history of other disorder of lipoprotein metabolism and other lipidemia: Secondary | ICD-10-CM

## 2020-07-29 DIAGNOSIS — E1165 Type 2 diabetes mellitus with hyperglycemia: Secondary | ICD-10-CM | POA: Diagnosis present

## 2020-07-29 DIAGNOSIS — I252 Old myocardial infarction: Secondary | ICD-10-CM

## 2020-07-29 DIAGNOSIS — Z885 Allergy status to narcotic agent status: Secondary | ICD-10-CM

## 2020-07-29 DIAGNOSIS — Z8673 Personal history of transient ischemic attack (TIA), and cerebral infarction without residual deficits: Secondary | ICD-10-CM | POA: Diagnosis not present

## 2020-07-29 DIAGNOSIS — L0231 Cutaneous abscess of buttock: Secondary | ICD-10-CM | POA: Diagnosis not present

## 2020-07-29 DIAGNOSIS — Z86010 Personal history of colonic polyps: Secondary | ICD-10-CM

## 2020-07-29 DIAGNOSIS — Z880 Allergy status to penicillin: Secondary | ICD-10-CM

## 2020-07-29 DIAGNOSIS — R6889 Other general symptoms and signs: Secondary | ICD-10-CM | POA: Diagnosis not present

## 2020-07-29 DIAGNOSIS — Z833 Family history of diabetes mellitus: Secondary | ICD-10-CM

## 2020-07-29 DIAGNOSIS — E440 Moderate protein-calorie malnutrition: Secondary | ICD-10-CM | POA: Diagnosis present

## 2020-07-29 DIAGNOSIS — E860 Dehydration: Secondary | ICD-10-CM | POA: Diagnosis present

## 2020-07-29 DIAGNOSIS — F332 Major depressive disorder, recurrent severe without psychotic features: Secondary | ICD-10-CM | POA: Diagnosis present

## 2020-07-29 DIAGNOSIS — T465X6A Underdosing of other antihypertensive drugs, initial encounter: Secondary | ICD-10-CM | POA: Diagnosis not present

## 2020-07-29 DIAGNOSIS — Z7984 Long term (current) use of oral hypoglycemic drugs: Secondary | ICD-10-CM

## 2020-07-29 DIAGNOSIS — Z91030 Bee allergy status: Secondary | ICD-10-CM

## 2020-07-29 DIAGNOSIS — I10 Essential (primary) hypertension: Secondary | ICD-10-CM | POA: Diagnosis not present

## 2020-07-29 DIAGNOSIS — I5032 Chronic diastolic (congestive) heart failure: Secondary | ICD-10-CM | POA: Diagnosis not present

## 2020-07-29 DIAGNOSIS — F331 Major depressive disorder, recurrent, moderate: Secondary | ICD-10-CM | POA: Diagnosis present

## 2020-07-29 DIAGNOSIS — K3184 Gastroparesis: Secondary | ICD-10-CM | POA: Diagnosis present

## 2020-07-29 DIAGNOSIS — Z91128 Patient's intentional underdosing of medication regimen for other reason: Secondary | ICD-10-CM

## 2020-07-29 DIAGNOSIS — Z794 Long term (current) use of insulin: Secondary | ICD-10-CM

## 2020-07-29 DIAGNOSIS — R109 Unspecified abdominal pain: Secondary | ICD-10-CM | POA: Diagnosis present

## 2020-07-29 DIAGNOSIS — E1122 Type 2 diabetes mellitus with diabetic chronic kidney disease: Secondary | ICD-10-CM | POA: Diagnosis not present

## 2020-07-29 DIAGNOSIS — Z8616 Personal history of COVID-19: Secondary | ICD-10-CM

## 2020-07-29 DIAGNOSIS — Z7902 Long term (current) use of antithrombotics/antiplatelets: Secondary | ICD-10-CM

## 2020-07-29 DIAGNOSIS — N182 Chronic kidney disease, stage 2 (mild): Secondary | ICD-10-CM | POA: Diagnosis not present

## 2020-07-29 DIAGNOSIS — R1030 Lower abdominal pain, unspecified: Secondary | ICD-10-CM | POA: Diagnosis not present

## 2020-07-29 DIAGNOSIS — Z8249 Family history of ischemic heart disease and other diseases of the circulatory system: Secondary | ICD-10-CM

## 2020-07-29 DIAGNOSIS — Z7901 Long term (current) use of anticoagulants: Secondary | ICD-10-CM

## 2020-07-29 DIAGNOSIS — N1831 Chronic kidney disease, stage 3a: Secondary | ICD-10-CM | POA: Diagnosis present

## 2020-07-29 DIAGNOSIS — R1032 Left lower quadrant pain: Secondary | ICD-10-CM | POA: Diagnosis not present

## 2020-07-29 DIAGNOSIS — Z808 Family history of malignant neoplasm of other organs or systems: Secondary | ICD-10-CM

## 2020-07-29 DIAGNOSIS — E1149 Type 2 diabetes mellitus with other diabetic neurological complication: Secondary | ICD-10-CM | POA: Diagnosis not present

## 2020-07-29 DIAGNOSIS — N183 Chronic kidney disease, stage 3 unspecified: Secondary | ICD-10-CM | POA: Diagnosis present

## 2020-07-29 DIAGNOSIS — R0789 Other chest pain: Secondary | ICD-10-CM | POA: Diagnosis not present

## 2020-07-29 DIAGNOSIS — R102 Pelvic and perineal pain: Secondary | ICD-10-CM

## 2020-07-29 DIAGNOSIS — Z825 Family history of asthma and other chronic lower respiratory diseases: Secondary | ICD-10-CM

## 2020-07-29 DIAGNOSIS — E876 Hypokalemia: Secondary | ICD-10-CM | POA: Diagnosis not present

## 2020-07-29 DIAGNOSIS — I499 Cardiac arrhythmia, unspecified: Secondary | ICD-10-CM | POA: Diagnosis not present

## 2020-07-29 DIAGNOSIS — Z955 Presence of coronary angioplasty implant and graft: Secondary | ICD-10-CM

## 2020-07-29 DIAGNOSIS — I480 Paroxysmal atrial fibrillation: Secondary | ICD-10-CM | POA: Diagnosis not present

## 2020-07-29 DIAGNOSIS — E785 Hyperlipidemia, unspecified: Secondary | ICD-10-CM | POA: Diagnosis present

## 2020-07-29 DIAGNOSIS — Z743 Need for continuous supervision: Secondary | ICD-10-CM | POA: Diagnosis not present

## 2020-07-29 DIAGNOSIS — K5750 Diverticulosis of both small and large intestine without perforation or abscess without bleeding: Secondary | ICD-10-CM | POA: Diagnosis not present

## 2020-07-29 DIAGNOSIS — Z86718 Personal history of other venous thrombosis and embolism: Secondary | ICD-10-CM

## 2020-07-29 DIAGNOSIS — I700 Atherosclerosis of aorta: Secondary | ICD-10-CM | POA: Diagnosis not present

## 2020-07-29 DIAGNOSIS — Z9049 Acquired absence of other specified parts of digestive tract: Secondary | ICD-10-CM | POA: Diagnosis not present

## 2020-07-29 DIAGNOSIS — R1110 Vomiting, unspecified: Secondary | ICD-10-CM | POA: Diagnosis present

## 2020-07-29 DIAGNOSIS — Z882 Allergy status to sulfonamides status: Secondary | ICD-10-CM

## 2020-07-29 DIAGNOSIS — Z79899 Other long term (current) drug therapy: Secondary | ICD-10-CM

## 2020-07-29 DIAGNOSIS — E1169 Type 2 diabetes mellitus with other specified complication: Secondary | ICD-10-CM | POA: Diagnosis not present

## 2020-07-29 DIAGNOSIS — E1143 Type 2 diabetes mellitus with diabetic autonomic (poly)neuropathy: Principal | ICD-10-CM | POA: Diagnosis present

## 2020-07-29 DIAGNOSIS — I2510 Atherosclerotic heart disease of native coronary artery without angina pectoris: Secondary | ICD-10-CM | POA: Diagnosis not present

## 2020-07-29 DIAGNOSIS — R079 Chest pain, unspecified: Secondary | ICD-10-CM | POA: Diagnosis not present

## 2020-07-29 DIAGNOSIS — Z800 Family history of malignant neoplasm of digestive organs: Secondary | ICD-10-CM

## 2020-07-29 DIAGNOSIS — Z87440 Personal history of urinary (tract) infections: Secondary | ICD-10-CM

## 2020-07-29 LAB — CBC WITH DIFFERENTIAL/PLATELET
Abs Immature Granulocytes: 0.05 10*3/uL (ref 0.00–0.07)
Basophils Absolute: 0 10*3/uL (ref 0.0–0.1)
Basophils Relative: 0 %
Eosinophils Absolute: 0 10*3/uL (ref 0.0–0.5)
Eosinophils Relative: 0 %
HCT: 40.9 % (ref 36.0–46.0)
Hemoglobin: 13.5 g/dL (ref 12.0–15.0)
Immature Granulocytes: 0 %
Lymphocytes Relative: 9 %
Lymphs Abs: 1.1 10*3/uL (ref 0.7–4.0)
MCH: 29.7 pg (ref 26.0–34.0)
MCHC: 33 g/dL (ref 30.0–36.0)
MCV: 89.9 fL (ref 80.0–100.0)
Monocytes Absolute: 0.7 10*3/uL (ref 0.1–1.0)
Monocytes Relative: 6 %
Neutro Abs: 10.1 10*3/uL — ABNORMAL HIGH (ref 1.7–7.7)
Neutrophils Relative %: 85 %
Platelets: 336 10*3/uL (ref 150–400)
RBC: 4.55 MIL/uL (ref 3.87–5.11)
RDW: 14.2 % (ref 11.5–15.5)
WBC: 12.1 10*3/uL — ABNORMAL HIGH (ref 4.0–10.5)
nRBC: 0 % (ref 0.0–0.2)

## 2020-07-29 LAB — CBG MONITORING, ED: Glucose-Capillary: 210 mg/dL — ABNORMAL HIGH (ref 70–99)

## 2020-07-29 LAB — URINALYSIS, ROUTINE W REFLEX MICROSCOPIC
Bilirubin Urine: NEGATIVE
Glucose, UA: >=500 mg/dL — AB
Hgb urine dipstick: NEGATIVE
Ketones, ur: 20 mg/dL — AB
Nitrite: NEGATIVE
Protein, ur: 30 mg/dL — AB
Specific Gravity, Urine: 1.021 (ref 1.005–1.030)
pH: 5 (ref 5.0–8.0)

## 2020-07-29 LAB — TROPONIN I (HIGH SENSITIVITY)
Troponin I (High Sensitivity): 17 ng/L (ref <18)
Troponin I (High Sensitivity): 19 ng/L — ABNORMAL HIGH (ref <18)

## 2020-07-29 LAB — COMPREHENSIVE METABOLIC PANEL
ALT: 20 U/L (ref 0–44)
AST: 22 U/L (ref 15–41)
Albumin: 3.7 g/dL (ref 3.5–5.0)
Alkaline Phosphatase: 109 U/L (ref 38–126)
Anion gap: 15 (ref 5–15)
BUN: 10 mg/dL (ref 8–23)
CO2: 20 mmol/L — ABNORMAL LOW (ref 22–32)
Calcium: 9.3 mg/dL (ref 8.9–10.3)
Chloride: 100 mmol/L (ref 98–111)
Creatinine, Ser: 1.16 mg/dL — ABNORMAL HIGH (ref 0.44–1.00)
GFR, Estimated: 49 mL/min — ABNORMAL LOW (ref >60)
Glucose, Bld: 320 mg/dL — ABNORMAL HIGH (ref 70–99)
Potassium: 3.7 mmol/L (ref 3.5–5.1)
Sodium: 135 mmol/L (ref 135–145)
Total Bilirubin: 1.2 mg/dL (ref 0.3–1.2)
Total Protein: 7.8 g/dL (ref 6.5–8.1)

## 2020-07-29 LAB — GLUCOSE, CAPILLARY: Glucose-Capillary: 201 mg/dL — ABNORMAL HIGH (ref 70–99)

## 2020-07-29 LAB — LIPASE, BLOOD: Lipase: 24 U/L (ref 11–51)

## 2020-07-29 MED ORDER — APIXABAN 5 MG PO TABS
5.0000 mg | ORAL_TABLET | Freq: Two times a day (BID) | ORAL | Status: DC
  Administered 2020-07-29 – 2020-08-05 (×14): 5 mg via ORAL
  Filled 2020-07-29 (×14): qty 1

## 2020-07-29 MED ORDER — HYDRALAZINE HCL 25 MG PO TABS
25.0000 mg | ORAL_TABLET | Freq: Four times a day (QID) | ORAL | Status: DC | PRN
  Administered 2020-07-29: 25 mg via ORAL
  Filled 2020-07-29: qty 1

## 2020-07-29 MED ORDER — ONDANSETRON HCL 4 MG/2ML IJ SOLN
4.0000 mg | Freq: Once | INTRAMUSCULAR | Status: AC
  Administered 2020-07-29: 4 mg via INTRAVENOUS
  Filled 2020-07-29: qty 2

## 2020-07-29 MED ORDER — FENTANYL CITRATE (PF) 100 MCG/2ML IJ SOLN
50.0000 ug | Freq: Once | INTRAMUSCULAR | Status: AC
  Administered 2020-07-29: 50 ug via INTRAVENOUS
  Filled 2020-07-29: qty 2

## 2020-07-29 MED ORDER — CLOPIDOGREL BISULFATE 75 MG PO TABS
75.0000 mg | ORAL_TABLET | Freq: Every day | ORAL | Status: DC
  Administered 2020-07-30 – 2020-08-05 (×7): 75 mg via ORAL
  Filled 2020-07-29 (×7): qty 1

## 2020-07-29 MED ORDER — SODIUM CHLORIDE 0.9 % IV SOLN
1.0000 g | Freq: Once | INTRAVENOUS | Status: AC
  Administered 2020-07-29: 1 g via INTRAVENOUS
  Filled 2020-07-29: qty 10

## 2020-07-29 MED ORDER — OXYCODONE HCL 5 MG PO TABS
5.0000 mg | ORAL_TABLET | Freq: Four times a day (QID) | ORAL | Status: DC | PRN
  Administered 2020-07-29: 5 mg via ORAL
  Filled 2020-07-29: qty 1

## 2020-07-29 MED ORDER — METOPROLOL SUCCINATE ER 25 MG PO TB24
12.5000 mg | ORAL_TABLET | Freq: Every day | ORAL | Status: DC
  Administered 2020-07-30 – 2020-08-01 (×3): 12.5 mg via ORAL
  Filled 2020-07-29 (×3): qty 1

## 2020-07-29 MED ORDER — INSULIN ASPART 100 UNIT/ML IJ SOLN
0.0000 [IU] | Freq: Three times a day (TID) | INTRAMUSCULAR | Status: DC
  Administered 2020-07-30 – 2020-08-03 (×8): 3 [IU] via SUBCUTANEOUS
  Administered 2020-08-03: 2 [IU] via SUBCUTANEOUS
  Administered 2020-08-04: 3 [IU] via SUBCUTANEOUS
  Administered 2020-08-04 – 2020-08-05 (×3): 2 [IU] via SUBCUTANEOUS

## 2020-07-29 MED ORDER — DAPAGLIFLOZIN PROPANEDIOL 5 MG PO TABS: 5.0000 mg | ORAL_TABLET | Freq: Every day | ORAL | Status: DC

## 2020-07-29 MED ORDER — METOCLOPRAMIDE HCL 5 MG/ML IJ SOLN
10.0000 mg | Freq: Three times a day (TID) | INTRAMUSCULAR | Status: DC
  Administered 2020-07-30: 10 mg via INTRAVENOUS
  Filled 2020-07-29: qty 2

## 2020-07-29 MED ORDER — ONDANSETRON HCL 4 MG PO TABS: 4.0000 mg | ORAL_TABLET | Freq: Once | ORAL | Status: DC

## 2020-07-29 MED ORDER — MORPHINE SULFATE (PF) 4 MG/ML IV SOLN
4.0000 mg | Freq: Once | INTRAVENOUS | Status: AC
Start: 2020-07-29 — End: 2020-07-29
  Administered 2020-07-29: 4 mg via INTRAVENOUS
  Filled 2020-07-29: qty 1

## 2020-07-29 MED ORDER — SODIUM CHLORIDE 0.9 % IV SOLN: INTRAVENOUS | Status: DC

## 2020-07-29 MED ORDER — LIDOCAINE-EPINEPHRINE (PF) 2 %-1:200000 IJ SOLN
10.0000 mL | Freq: Once | INTRAMUSCULAR | Status: AC
  Administered 2020-07-29: 10 mL
  Filled 2020-07-29: qty 20

## 2020-07-29 MED ORDER — GABAPENTIN 300 MG PO CAPS
300.0000 mg | ORAL_CAPSULE | Freq: Two times a day (BID) | ORAL | Status: DC
  Administered 2020-07-29 – 2020-08-05 (×14): 300 mg via ORAL
  Filled 2020-07-29 (×14): qty 1

## 2020-07-29 MED ORDER — METOCLOPRAMIDE HCL 5 MG/ML IJ SOLN
5.0000 mg | Freq: Once | INTRAMUSCULAR | Status: AC
  Administered 2020-07-29: 5 mg via INTRAVENOUS
  Filled 2020-07-29: qty 2

## 2020-07-29 MED ORDER — FUROSEMIDE 20 MG PO TABS: 20.0000 mg | ORAL_TABLET | Freq: Every day | ORAL | Status: DC | PRN

## 2020-07-29 MED ORDER — HEPARIN SODIUM (PORCINE) 5000 UNIT/ML IJ SOLN
5000.0000 [IU] | Freq: Two times a day (BID) | INTRAMUSCULAR | Status: DC
  Administered 2020-07-29 – 2020-07-30 (×2): 5000 [IU] via SUBCUTANEOUS
  Filled 2020-07-29 (×2): qty 1

## 2020-07-29 MED ORDER — INSULIN GLARGINE 100 UNIT/ML ~~LOC~~ SOLN
10.0000 [IU] | Freq: Every day | SUBCUTANEOUS | Status: DC
  Administered 2020-07-29 – 2020-08-01 (×4): 10 [IU] via SUBCUTANEOUS
  Filled 2020-07-29 (×4): qty 0.1

## 2020-07-29 MED ORDER — NYSTATIN 100000 UNIT/ML MT SUSP
5.0000 mL | Freq: Four times a day (QID) | OROMUCOSAL | Status: AC
  Administered 2020-07-29 – 2020-08-03 (×22): 500000 [IU] via ORAL
  Filled 2020-07-29 (×23): qty 5

## 2020-07-29 MED ORDER — LATANOPROST 0.005 % OP SOLN
1.0000 [drp] | Freq: Every day | OPHTHALMIC | Status: DC
  Administered 2020-07-30 – 2020-08-04 (×7): 1 [drp] via OPHTHALMIC
  Filled 2020-07-29: qty 2.5

## 2020-07-29 MED ORDER — ATORVASTATIN CALCIUM 40 MG PO TABS
40.0000 mg | ORAL_TABLET | Freq: Every day | ORAL | Status: DC
  Filled 2020-07-29: qty 1

## 2020-07-29 MED ORDER — METOCLOPRAMIDE HCL 5 MG PO TABS
10.0000 mg | ORAL_TABLET | Freq: Four times a day (QID) | ORAL | Status: DC | PRN
  Administered 2020-07-29: 10 mg via ORAL
  Filled 2020-07-29: qty 1

## 2020-07-29 MED ORDER — ALBUTEROL SULFATE (2.5 MG/3ML) 0.083% IN NEBU: 2.5000 mg | INHALATION_SOLUTION | Freq: Four times a day (QID) | RESPIRATORY_TRACT | Status: DC | PRN

## 2020-07-29 MED ORDER — DULOXETINE HCL 20 MG PO CPEP
20.0000 mg | ORAL_CAPSULE | Freq: Every day | ORAL | Status: DC
  Administered 2020-07-30 – 2020-08-05 (×7): 20 mg via ORAL
  Filled 2020-07-29 (×7): qty 1

## 2020-07-29 MED ORDER — PANTOPRAZOLE SODIUM 40 MG PO TBEC
40.0000 mg | DELAYED_RELEASE_TABLET | Freq: Two times a day (BID) | ORAL | Status: DC
  Administered 2020-07-29 – 2020-08-05 (×14): 40 mg via ORAL
  Filled 2020-07-29 (×14): qty 1

## 2020-07-29 MED ORDER — DICYCLOMINE HCL 10 MG PO CAPS
10.0000 mg | ORAL_CAPSULE | Freq: Once | ORAL | Status: AC
  Administered 2020-07-29: 10 mg via ORAL
  Filled 2020-07-29: qty 1

## 2020-07-29 MED ORDER — ACETAMINOPHEN 500 MG PO TABS
500.0000 mg | ORAL_TABLET | Freq: Four times a day (QID) | ORAL | Status: DC | PRN
  Administered 2020-07-29 – 2020-08-05 (×5): 500 mg via ORAL
  Filled 2020-07-29 (×5): qty 1

## 2020-07-29 MED ORDER — ISOSORBIDE MONONITRATE ER 30 MG PO TB24
15.0000 mg | ORAL_TABLET | Freq: Every day | ORAL | Status: DC
  Administered 2020-07-30 – 2020-08-05 (×7): 15 mg via ORAL
  Filled 2020-07-29 (×7): qty 1

## 2020-07-29 MED ORDER — SODIUM CHLORIDE 0.9 % IV BOLUS
1000.0000 mL | Freq: Once | INTRAVENOUS | Status: AC
  Administered 2020-07-29: 1000 mL via INTRAVENOUS

## 2020-07-29 MED ORDER — VITAMIN B-12 100 MCG PO TABS
200.0000 ug | ORAL_TABLET | Freq: Every day | ORAL | Status: DC
  Administered 2020-07-30 – 2020-08-05 (×7): 200 ug via ORAL
  Filled 2020-07-29 (×7): qty 2

## 2020-07-29 MED ORDER — POLYETHYLENE GLYCOL 3350 17 G PO PACK: 17.0000 g | PACK | Freq: Every day | ORAL | Status: DC | PRN

## 2020-07-29 MED ORDER — GLIPIZIDE 5 MG PO TABS
5.0000 mg | ORAL_TABLET | Freq: Two times a day (BID) | ORAL | Status: DC
  Administered 2020-07-30: 5 mg via ORAL
  Filled 2020-07-29 (×2): qty 1
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
## 2020-07-29 NOTE — ED Notes (Signed)
Pt c/o a headache, nausea, and abd pain at this time

## 2020-07-29 NOTE — ED Notes (Signed)
Received verbal report from Shea Evans at this time

## 2020-07-29 NOTE — ED Triage Notes (Signed)
BIB EMS from home. C/o chest pain and abdominal pain for the last few weeks. Was seen at another facility for the same issues 07/10/2020.
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
## 2020-07-29 NOTE — ED Provider Notes (Signed)
Care from previous provider at shift change. See note for full HPI.  Information 75 year old who came for evaluation of continued nausea, vomiting and abdominal pain.  Patient had recent extensive hospitalizations for similar complaints.  She was diagnosed with urinary tract infection at that time.  Dr. Lorin Mercy with [facility] had assessed patient.  She had contacted GI, Dr. Tarri Glenn.  They did not feel at that time patient needed hospitalization and recommended close outpatient follow-up.  Patient was handed off to me to discharge after IV fluid completion.   Physical Exam  BP (!) 182/85   Pulse 96   Temp (!) 97.5 F (36.4 C) (Oral)   Resp 18   Ht 5\' 3"  (1.6 m)   Wt 58.1 kg   SpO2 98%   BMI 22.67 kg/m   Physical Exam Vitals and nursing note reviewed.  Constitutional:      General: She is not in acute distress.    Appearance: She is well-developed.     Comments: Active NBNB emesis on exam  HENT:     Head: Atraumatic.  Eyes:     Pupils: Pupils are equal, round, and reactive to light.  Cardiovascular:     Rate and Rhythm: Normal rate.  Pulmonary:     Effort: No respiratory distress.  Abdominal:     General: There is no distension.     Comments: Diffuse tenderness. No rebound or guarding  Musculoskeletal:        General: Normal range of motion.     Cervical back: Normal range of motion.  Skin:    General: Skin is warm and dry.  Neurological:     Mental Status: She is alert.     ED Course/Procedures   Clinical Course as of 07/29/20 2159  Sat Jul 29, 2020  1453 Glucose(!): 320 [CG]  1454 Anion gap: 15 [CG]  1454 CO2(!): 20 [CG]  1603 Finish IVF, Dr. Lorin Mercy with [facility] saw>> See note for outpatient rec, Plan to increased Cymbalta, Bentyl. GI will call on Monday. Plan to likely dc home. [BH]    Clinical Course User Index [BH] Kalee Mcclenathan A, PA-C [CG] Kinnie Feil, PA-C    Procedures Labs Reviewed  CBC WITH DIFFERENTIAL/PLATELET - Abnormal; Notable for the  following components:      Result Value   WBC 12.1 (*)    Neutro Abs 10.1 (*)    All other components within normal limits  COMPREHENSIVE METABOLIC PANEL - Abnormal; Notable for the following components:   CO2 20 (*)    Glucose, Bld 320 (*)    Creatinine, Ser 1.16 (*)    GFR, Estimated 49 (*)    All other components within normal limits  URINALYSIS, ROUTINE W REFLEX MICROSCOPIC - Abnormal; Notable for the following components:   APPearance HAZY (*)    Glucose, UA >=500 (*)    Ketones, ur 20 (*)    Protein, ur 30 (*)    Leukocytes,Ua SMALL (*)    Bacteria, UA RARE (*)    All other components within normal limits  CBG MONITORING, ED - Abnormal; Notable for the following components:   Glucose-Capillary 210 (*)    All other components within normal limits  URINE CULTURE  SARS CORONAVIRUS 2 (TAT 6-24 HRS)  LIPASE, BLOOD  BASIC METABOLIC PANEL  CBC  RAPID URINE DRUG SCREEN, HOSP PERFORMED  TROPONIN I (HIGH SENSITIVITY)  TROPONIN I (HIGH SENSITIVITY)  CT ABDOMEN PELVIS WO CONTRAST  Result Date: 07/24/2020 CLINICAL DATA:  Left lower quadrant  abdominal pain with nausea and vomiting EXAM: CT ABDOMEN AND PELVIS WITHOUT CONTRAST TECHNIQUE: Multidetector CT imaging of the abdomen and pelvis was performed following the standard protocol without IV contrast. COMPARISON:  07/10/2020 FINDINGS: Lower chest: Small bilateral pleural effusions observed with passive atelectasis. Mild cardiomegaly is present. Abnormal posterior pericardial thickening is observed with some high density nodular components and components adjacent to the obtuse marginal coronary artery, these have been present since 2016 although particularly the component adjacent to the obtuse marginal coronary artery shows increased thickness over the last year, measuring 1.6 cm in thickness today and measuring only about 0.8 cm in thickness on 07/07/2019. Coronary and descending thoracic aortic atherosclerosis. Small type 1 hiatal hernia.  Hepatobiliary: Punctate calcifications compatible with old granulomatous disease. Cholecystectomy. Pancreas: Unremarkable Spleen: Scattered calcifications compatible with old granulomatous disease. Adrenals/Urinary Tract: Unremarkable Stomach/Bowel: Descending duodenal diverticulum. Normal appendix. Scattered descending and sigmoid colon diverticula. Mild prominence of rectal gas and stool. Vascular/Lymphatic: Aortoiliac atherosclerotic vascular disease. Reproductive: Unremarkable Other: Low-level nonspecific presacral edema. Musculoskeletal: Lumbar spondylosis and degenerative disc disease. IMPRESSION: 1. Chronic pericardial thickening posteriorly with a high density component superficial to the circumflex coronary artery, and increased thickening adjacent to the obtuse marginal coronary artery compared to prior exams for example from 2019, and mildly increased from 03/27/2020. Cannot completely exclude a saphenous vein graft aneurysm adjacent to the obtuse marginal coronary artery although this chronic progressive thickening could alternatively be from localized chronic fibrosing pericarditis. Today's CT was performed without IV contrast and is not a dedicated coronary artery CT. The patient had a fairly recent echocardiogram on 07/02/2020 reportedly showing an EF of 60-65%. I would recommend making the patient's cardiologist aware of the current exam findings along the pericardium/coronary arteries in case further workup is warranted. 2. Mild cardiomegaly. Coronary atherosclerosis. Aortic Atherosclerosis (ICD10-I70.0). 3. Old granulomatous disease. 4. Scattered sigmoid colon and descending colon diverticula. Mild prominence of rectal gas and stool. 5. Lumbar spondylosis and degenerative disc disease. Electronically Signed   By: Van Clines M.D.   On: 07/24/2020 08:14   CT ABDOMEN PELVIS WO CONTRAST  Result Date: 07/10/2020 CLINICAL DATA:  Acute abdominal pain EXAM: CT ABDOMEN AND PELVIS WITHOUT CONTRAST  TECHNIQUE: Multidetector CT imaging of the abdomen and pelvis was performed following the standard protocol without IV contrast. COMPARISON:  03/27/2020 FINDINGS: Lower chest: No acute abnormality. Chronic pericardial thickening is noted. Hepatobiliary: No focal liver abnormality is seen. Status post cholecystectomy. No biliary dilatation. Pancreas: Unremarkable. No pancreatic ductal dilatation or surrounding inflammatory changes. Spleen: Multiple calcified granulomas are noted Adrenals/Urinary Tract: Adrenal glands are within normal limits. No renal calculi are noted. No obstructive changes are seen. The bladder is partially distended. Stomach/Bowel: The appendix is within normal limits. No obstructive or inflammatory changes of the colon are seen. Mild diverticular changes noted without diverticulitis. Small bowel and stomach appear within normal limits. Vascular/Lymphatic: Aortic atherosclerosis. No enlarged abdominal or pelvic lymph nodes. Reproductive: Uterus and bilateral adnexa are unremarkable. Other: No abdominal wall hernia or abnormality. No abdominopelvic ascites. Musculoskeletal: No acute or significant osseous findings. IMPRESSION: Diverticulosis without diverticulitis. Changes of prior granulomatous disease. Chronic thickening of the pericardium. This is stable dating back to 2016 and likely related to prior cardiac surgery. Electronically Signed   By: Inez Catalina M.D.   On: 07/10/2020 16:50   DG Chest Portable 1 View  Result Date: 07/01/2020 CLINICAL DATA:  Chest pain EXAM: PORTABLE CHEST 1 VIEW COMPARISON:  03/27/2020 FINDINGS: Post CABG changes. Loop recorder device is present.  Stable cardiomediastinal contours. Atherosclerotic calcification of the aortic knob. A skin fold projects over the right upper lobe. Mild bibasilar atelectasis. No appreciable pleural fluid collection. No pneumothorax. IMPRESSION: Mild bibasilar atelectasis. Electronically Signed   By: Davina Poke D.O.   On:  07/01/2020 11:27   DG Abdomen Acute W/Chest  Result Date: 07/29/2020 CLINICAL DATA:  Left-sided abdominal pain. EXAM: DG ABDOMEN ACUTE WITH 1 VIEW CHEST COMPARISON:  CT of the abdomen pelvis 07/23/2020 FINDINGS: Heart is enlarged. The patient is status post median sternotomy for CABG. Moderate device is in place. Lungs are clear. Bowel gas pattern is unremarkable. No free air or fluid levels are present. Degenerative changes are noted in the lumbar spine. IMPRESSION: 1. No acute cardiopulmonary disease. 2. Normal bowel gas pattern. 3. Cardiomegaly without failure. Electronically Signed   By: San Morelle M.D.   On: 07/29/2020 13:56   DG Abd Portable 1V  Result Date: 07/18/2020 CLINICAL DATA:  Abdominal pain, nausea EXAM: PORTABLE ABDOMEN - 1 VIEW COMPARISON:  CT 07/10/2020 FINDINGS: Nonobstructive bowel gas pattern. Moderate stool burden throughout the colon. Prior cholecystectomy. No organomegaly or free air. IMPRESSION: Moderate stool burden.  No acute findings. Electronically Signed   By: Rolm Baptise M.D.   On: 07/18/2020 12:01   ECHOCARDIOGRAM COMPLETE  Result Date: 07/02/2020    ECHOCARDIOGRAM REPORT   Patient Name:   BASMA BUCHNER Date of Exam: 07/02/2020 Medical Rec #:  [id number]    Height:       62.5 in Accession #:    [id number]   Weight:       140.7 lb Date of Birth:  1945/09/21    BSA:          1.656 m Patient Age:    64 years     BP:           116/72 mmHg Patient Gender: F            HR:           81 bpm. Exam Location:  Inpatient Procedure: 2D Echo Indications:    chest pain  History:        Patient has prior history of Echocardiogram examinations, most                 recent 07/11/2019. CHF, Prior CABG; Risk Factors:Diabetes,                 Hypertension and Dyslipidemia.  Sonographer:    Johny Chess Referring Phys: [place]  1. Left ventricular ejection fraction, by estimation, is 60 to 65%. The left ventricle has normal function. The left ventricle has  no regional wall motion abnormalities. There is mild left ventricular hypertrophy. Left ventricular diastolic parameters are consistent with Grade I diastolic dysfunction (impaired relaxation). Elevated left atrial pressure.  2. Right ventricular systolic function is normal. The right ventricular size is normal. There is mildly elevated pulmonary artery systolic pressure.  3. The mitral valve is normal in structure. Mild mitral valve regurgitation. No evidence of mitral stenosis.  4. Tricuspid valve regurgitation is mild to moderate.  5. The aortic valve is tricuspid. Aortic valve regurgitation is not visualized. No aortic stenosis is present.  6. The inferior vena cava is normal in size with greater than 50% respiratory variability, suggesting right atrial pressure of 3 mmHg. FINDINGS  Left Ventricle: Left ventricular ejection fraction, by estimation, is 60 to 65%. The left ventricle has normal function. The left ventricle has no regional  wall motion abnormalities. The left ventricular internal cavity size was normal in size. There is  mild left ventricular hypertrophy. Left ventricular diastolic parameters are consistent with Grade I diastolic dysfunction (impaired relaxation). Elevated left atrial pressure. Right Ventricle: The right ventricular size is normal. Right ventricular systolic function is normal. There is mildly elevated pulmonary artery systolic pressure. The tricuspid regurgitant velocity is 3.12 m/s, and with an assumed right atrial pressure of 3 mmHg, the estimated right ventricular systolic pressure is 79.0 mmHg. Left Atrium: Left atrial size was normal in size. Right Atrium: Right atrial size was normal in size. Pericardium: Trivial pericardial effusion is present. Mitral Valve: The mitral valve is normal in structure. Mild mitral valve regurgitation. No evidence of mitral valve stenosis. Tricuspid Valve: The tricuspid valve is normal in structure. Tricuspid valve regurgitation is mild to  moderate. No evidence of tricuspid stenosis. Aortic Valve: The aortic valve is tricuspid. Aortic valve regurgitation is not visualized. No aortic stenosis is present. Pulmonic Valve: The pulmonic valve was normal in structure. Pulmonic valve regurgitation is not visualized. No evidence of pulmonic stenosis. Aorta: The aortic root is normal in size and structure. Venous: The inferior vena cava is normal in size with greater than 50% respiratory variability, suggesting right atrial pressure of 3 mmHg.   LEFT VENTRICLE PLAX 2D LVIDd:         3.30 cm  Diastology LVIDs:         2.50 cm  LV e' medial:    5.44 cm/s LV PW:         1.20 cm  LV E/e' medial:  20.6 LV IVS:        1.10 cm  LV e' lateral:   4.35 cm/s LVOT diam:     1.80 cm  LV E/e' lateral: 25.7 LV SV:         40 LV SV Index:   24 LVOT Area:     2.54 cm  RIGHT VENTRICLE            IVC RV S prime:     9.90 cm/s  IVC diam: 1.30 cm TAPSE (M-mode): 1.4 cm LEFT ATRIUM             Index       RIGHT ATRIUM           Index LA diam:        3.30 cm 1.99 cm/m  RA Area:     11.00 cm LA Vol (A2C):   27.6 ml 16.67 ml/m RA Volume:   21.20 ml  12.80 ml/m LA Vol (A4C):   42.0 ml 25.36 ml/m LA Biplane Vol: 34.7 ml 20.96 ml/m  AORTIC VALVE LVOT Vmax:   78.80 cm/s LVOT Vmean:  52.000 cm/s LVOT VTI:    0.158 m  AORTA Ao Root diam: 3.20 cm Ao Asc diam:  3.00 cm MITRAL VALVE                TRICUSPID VALVE MV Area (PHT): 2.87 cm     TR Peak grad:   38.9 mmHg MV Decel Time: 264 msec     TR Vmax:        312.00 cm/s MV E velocity: 112.00 cm/s MV A velocity: 113.00 cm/s  SHUNTS MV E/A ratio:  0.99         Systemic VTI:  0.16 m  Systemic Diam: 1.80 cm Kirk Ruths MD Electronically signed by Kirk Ruths MD Signature Date/Time: 07/02/2020/12:13:31 PM    Final    ECHOCARDIOGRAM LIMITED  Result Date: 07/24/2020    ECHOCARDIOGRAM LIMITED REPORT   Patient Name:   SULEIMA SOON Date of Exam: 07/24/2020 Medical Rec #:  [id number]    Height:       62.0 in  Accession #:    [id number]   Weight:       146.8 lb Date of Birth:  1945-03-20    BSA:          1.676 m Patient Age:    19 years     BP:           135/89 mmHg Patient Gender: F            HR:           79 bpm. Exam Location:  Inpatient Procedure: Limited Echo, Color Doppler and Cardiac Doppler Indications:    Other abnormalities of the heart R00.8  History:        Patient has prior history of Echocardiogram examinations, most                 recent 07/02/2020. CAD and Previous Myocardial Infarction, Prior                 CABG, Arrythmias:RBBB; Risk Factors:Hypertension, Diabetes and                 Dyslipidemia. History of Aflutter, hypotension.  Sonographer:    Darlina Sicilian RDCS Referring Phys: [place]  1. Left ventricular ejection fraction, by estimation, is 65 to 70%. The left ventricle has normal function.  2. Right ventricular systolic function is normal. The right ventricular size is normal.  3. The mitral valve is normal in structure. Trivial mitral valve regurgitation.  4. The aortic valve is normal in structure. Aortic valve regurgitation is not visualized. Comparison(s): The left ventricular function is unchanged. FINDINGS  Left Ventricle: Left ventricular ejection fraction, by estimation, is 65 to 70%. The left ventricle has normal function. Right Ventricle: The right ventricular size is normal. Right vetricular wall thickness was not assessed. Right ventricular systolic function is normal. Left Atrium: Left atrial size was normal in size. Right Atrium: Right atrial size was normal in size. Pericardium: There is no evidence of pericardial effusion. Mitral Valve: The mitral valve is normal in structure. Mild mitral annular calcification. Trivial mitral valve regurgitation. Tricuspid Valve: The tricuspid valve is normal in structure. Tricuspid valve regurgitation is trivial. Aortic Valve: The aortic valve is normal in structure. Aortic valve regurgitation is not visualized.  Pulmonic Valve: The pulmonic valve was grossly normal. Pulmonic valve regurgitation is not visualized. Aorta: The aortic root is normal in size and structure. LEFT VENTRICLE PLAX 2D LVOT diam:     1.80 cm LVOT Area:     2.54 cm   SHUNTS Systemic Diam: 1.80 cm [name] MD Electronically signed by [name] MD Signature Date/Time: 07/24/2020/4:31:10 PM    Final    MDM  Martin Majestic to assess patient after IV fluids.  She still states she has increased pain, emesis despite prior antiemetics.  Reviewed her labs and imaging with patient.  Given emesis will add on additional antiemetic, Bentyl here in ED.  Patient reassessed.  Still has emesis despite 2 antiemetics here in ED, actively dry heaving in room on reassessment, Large NBNB emesis in bag. Pain controlled currently.  Will touch  base with [facility] given patient still has persistent emesis.  CONSULT with Dr. Roosevelt Locks with [facility]. Discussed patients continued emesis here in ED despite, Reglan, Zofran. He will assess patient at bedside.  Dr. Roosevelt Locks has assessed patient. Will admit for intractable nausea and vomiting.  With regards to patient's abdominal pain.  Seems to been persistent since her prior admission.  I do not feel she needs repeat CT scan at this time.  Low suspicion for acute infectious process,      Broady Lafoy A, PA-C 07/29/20 2339    Drenda Freeze, MD 08/01/20 [phone]
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
## 2020-07-29 NOTE — ED Notes (Signed)
Attempted to call report advised nurse not available

## 2020-07-30 ENCOUNTER — Observation Stay (HOSPITAL_COMMUNITY): Payer: Medicare Other

## 2020-07-30 DIAGNOSIS — R112 Nausea with vomiting, unspecified: Secondary | ICD-10-CM

## 2020-07-30 DIAGNOSIS — R1030 Lower abdominal pain, unspecified: Secondary | ICD-10-CM

## 2020-07-30 DIAGNOSIS — Z9049 Acquired absence of other specified parts of digestive tract: Secondary | ICD-10-CM | POA: Diagnosis not present

## 2020-07-30 DIAGNOSIS — R109 Unspecified abdominal pain: Secondary | ICD-10-CM | POA: Diagnosis not present

## 2020-07-30 DIAGNOSIS — K5750 Diverticulosis of both small and large intestine without perforation or abscess without bleeding: Secondary | ICD-10-CM | POA: Diagnosis not present

## 2020-07-30 DIAGNOSIS — I700 Atherosclerosis of aorta: Secondary | ICD-10-CM | POA: Diagnosis not present

## 2020-07-30 LAB — BASIC METABOLIC PANEL
Anion gap: 10 (ref 5–15)
BUN: 7 mg/dL — ABNORMAL LOW (ref 8–23)
CO2: 20 mmol/L — ABNORMAL LOW (ref 22–32)
Calcium: 8.5 mg/dL — ABNORMAL LOW (ref 8.9–10.3)
Chloride: 107 mmol/L (ref 98–111)
Creatinine, Ser: 0.95 mg/dL (ref 0.44–1.00)
GFR, Estimated: >60 mL/min (ref >60)
Glucose, Bld: 152 mg/dL — ABNORMAL HIGH (ref 70–99)
Potassium: 3.3 mmol/L — ABNORMAL LOW (ref 3.5–5.1)
Sodium: 137 mmol/L (ref 135–145)

## 2020-07-30 LAB — CBC
HCT: 34.7 % — ABNORMAL LOW (ref 36.0–46.0)
Hemoglobin: 11.6 g/dL — ABNORMAL LOW (ref 12.0–15.0)
MCH: 29.9 pg (ref 26.0–34.0)
MCHC: 33.4 g/dL (ref 30.0–36.0)
MCV: 89.4 fL (ref 80.0–100.0)
Platelets: 274 10*3/uL (ref 150–400)
RBC: 3.88 MIL/uL (ref 3.87–5.11)
RDW: 14.2 % (ref 11.5–15.5)
WBC: 8.5 10*3/uL (ref 4.0–10.5)
nRBC: 0 % (ref 0.0–0.2)

## 2020-07-30 LAB — URINE CULTURE: Culture: NO GROWTH

## 2020-07-30 LAB — GLUCOSE, CAPILLARY
Glucose-Capillary: 158 mg/dL — ABNORMAL HIGH (ref 70–99)
Glucose-Capillary: 73 mg/dL (ref 70–99)
Glucose-Capillary: 176 mg/dL — ABNORMAL HIGH (ref 70–99)
Glucose-Capillary: 183 mg/dL — ABNORMAL HIGH (ref 70–99)
Glucose-Capillary: 192 mg/dL — ABNORMAL HIGH (ref 70–99)

## 2020-07-30 LAB — SARS CORONAVIRUS 2 (TAT 6-24 HRS): SARS Coronavirus 2: NEGATIVE

## 2020-07-30 MED ORDER — ONDANSETRON HCL 4 MG/2ML IJ SOLN
4.0000 mg | Freq: Four times a day (QID) | INTRAMUSCULAR | Status: DC
  Administered 2020-07-30 – 2020-08-02 (×10): 4 mg via INTRAVENOUS
  Filled 2020-07-30 (×11): qty 2

## 2020-07-30 MED ORDER — OXYCODONE HCL 5 MG PO TABS
5.0000 mg | ORAL_TABLET | Freq: Three times a day (TID) | ORAL | Status: DC | PRN
  Administered 2020-07-30 – 2020-08-04 (×9): 5 mg via ORAL
  Filled 2020-07-30 (×9): qty 1

## 2020-07-30 MED ORDER — SODIUM CHLORIDE 0.9 % IV SOLN: INTRAVENOUS | Status: DC

## 2020-07-30 MED ORDER — METOCLOPRAMIDE HCL 5 MG/ML IJ SOLN
10.0000 mg | Freq: Four times a day (QID) | INTRAMUSCULAR | Status: DC
  Administered 2020-07-30 – 2020-08-03 (×15): 10 mg via INTRAVENOUS
  Filled 2020-07-30 (×16): qty 2

## 2020-07-30 NOTE — Progress Notes (Signed)
Received pt alert and oriented x4.  Draining wound noted to left and mid buttocks. Will monitor pt.

## 2020-07-30 NOTE — Progress Notes (Signed)
Maria Fox  BJY:[id number] DOB: 12/30/1945 DOA: 07/29/2020 PCP: [name], MD    Brief Narrative:  75 year old with a history of DM2, diabetic neuropathy, HTN, HLD, CAD status post CABG, paroxysmal atrial fibrillation on chronic Eliquis, CKD stage II, and chronic diastolic CHF who was hospitalized 5/2 >07/27/2020 with Klebsiella pneumonia a acute cystitis and intractable nausea and vomiting.  She was ultimately diagnosed with a functional GI disorder after an unremarkable work-up.  She returned to the ER stating that her abdominal pain never improved and that she continued to have nausea and vomiting.  She was incidentally noted to have a draining buttock abscess in the ED.  Significant Events:  5/2 > 5/19 admission with cystitis and functional GI disorder 5/21 readmit with same abdominal symptoms  Consultants:  None  Code Status: FULL CODE  Antimicrobials:  Rocephin 5/21  DVT prophylaxis: Subcutaneous heparin  Subjective: Afebrile.  Vital signs stable.  Resting comfortably at the time of my visit.  Assessment & Plan:  Intractable nausea/vomiting/abdominal pain -functional GI disorder No role for repeat imaging with CT scan abdomen/pelvis x2 in last 2 weeks -gastroparesis likely etiology -quickly wean narcotics to off as they are likely worsening symptoms - scheduled Reglan - strict CBG control  Modest hypokalemia Supplement and follow -check magnesium  Acute kidney injury Creatinine 0.9 at time of discharge and 1.16 at time of return -quickly returned to baseline with gentle hydration  Recent Labs  Lab 07/24/20 0521 07/29/20 1137 07/30/20 0636  CREATININE 0.91 1.16* 0.95     Uncontrolled HTN Noncompliant with blood pressure medications -blood pressure presently well controlled  Uncontrolled DM2 with hyperglycemia Strict control of CBG of lost to control diabetic gastroparesis -CBG at goal presently  Paroxysmal atrial fibrillation Continue usual Eliquis  dosing  CAD status post CABG Asymptomatic  Draining wounds of the left buttock Monitor with local wound care  Family Communication:  Status is: Observation  The patient remains OBS appropriate and will d/c before 2 midnights.  Dispo: The patient is from: Home              Anticipated d/c is to: Home              Patient currently is not medically stable to d/c.   Difficult to place patient No     Objective: Blood pressure 131/84, pulse (!) 103, temperature 98.3 F (36.8 C), temperature source Oral, resp. rate 16, height 5\' 3"  (1.6 m), weight 63.6 kg, SpO2 96 %.  Intake/Output Summary (Last 24 hours) at 07/30/2020 0955 Last data filed at 07/30/2020 0865 Gross per 24 hour  Intake 1232.74 ml  Output 0 ml  Net 1232.74 ml   Filed Weights   07/29/20 1133 07/29/20 2229  Weight: 58.1 kg 63.6 kg    Examination: General: No acute respiratory distress Lungs: Clear bilaterally Cardiovascular: Regular rate  Abdomen: Nondistended Extremities: No significant edema bilateral lower extremities  CBC: Recent Labs  Lab 07/29/20 1137 07/30/20 0636  WBC 12.1* 8.5  NEUTROABS 10.1*  --   HGB 13.5 11.6*  HCT 40.9 34.7*  MCV 89.9 89.4  PLT 336 784   Basic Metabolic Panel: Recent Labs  Lab 07/24/20 0521 07/29/20 1137 07/30/20 0636  NA 136 135 137  K 4.5 3.7 3.3*  CL 105 100 107  CO2 25 20* 20*  GLUCOSE 104* 320* 152*  BUN 12 10 7*  CREATININE 0.91 1.16* 0.95  CALCIUM 8.9 9.3 8.5*  MG 1.8  --   --  PHOS 4.4  --   --    GFR: Estimated Creatinine Clearance: 44.5 mL/min (by C-G formula based on SCr of 0.95 mg/dL).  Liver Function Tests: Recent Labs  Lab 07/29/20 1137  AST 22  ALT 20  ALKPHOS 109  BILITOT 1.2  PROT 7.8  ALBUMIN 3.7   Recent Labs  Lab 07/29/20 1137  LIPASE 24    HbA1C: Hgb A1c MFr Bld  Date/Time Value Ref Range Status  07/03/2020 09:37 AM 11.6 (H) 4.8 - 5.6 % Final    Comment:    (NOTE) Pre diabetes:          5.7%-6.4%  Diabetes:               >6.4%  Glycemic control for   <7.0% adults with diabetes   05/02/2020 03:25 PM 12.2 (H) 4.6 - 6.5 % Final    Comment:    Glycemic Control Guidelines for People with Diabetes:Non Diabetic:  <6%Goal of Therapy: <7%Additional Action Suggested:  >8%     CBG: Recent Labs  Lab 07/27/20 0755 07/27/20 1220 07/29/20 2020 07/29/20 2231 07/30/20 0857  GLUCAP 112* 166* 210* 201* 158*    Recent Results (from the past 240 hour(s))  SARS CORONAVIRUS 2 (TAT 6-24 HRS) Nasopharyngeal Nasopharyngeal Swab     Status: None   Collection Time: 07/29/20  9:50 PM   Specimen: Nasopharyngeal Swab  Result Value Ref Range Status   SARS Coronavirus 2 NEGATIVE NEGATIVE Final    Comment: (NOTE) SARS-CoV-2 target nucleic acids are NOT DETECTED.  The SARS-CoV-2 RNA is generally detectable in upper and lower respiratory specimens during the acute phase of infection. Negative results do not preclude SARS-CoV-2 infection, do not rule out co-infections with other pathogens, and should not be used as the sole basis for treatment or other patient management decisions. Negative results must be combined with clinical observations, patient history, and epidemiological information. The expected result is Negative.  Fact Sheet for Patients: SugarRoll.be  Fact Sheet for Healthcare Providers: https://www.woods-mathews.com/  This test is not yet approved or cleared by the [place] FDA and  has been authorized for detection and/or diagnosis of SARS-CoV-2 by FDA under an Emergency Use Authorization (EUA). This EUA will remain  in effect (meaning this test can be used) for the duration of the COVID-19 declaration under Se ction 564(b)(1) of the Act, 21 U.S.C. section 360bbb-3(b)(1), unless the authorization is terminated or revoked sooner.  Performed at [facility], [address] [place], [place] [address]      Scheduled Meds: . apixaban  5 mg  Oral BID  . atorvastatin  40 mg Oral QHS  . clopidogrel  75 mg Oral Daily  . DULoxetine  20 mg Oral Daily  . gabapentin  300 mg Oral BID  . glipiZIDE  5 mg Oral BID AC  . heparin  5,000 Units Subcutaneous Q12H  . insulin aspart  0-15 Units Subcutaneous TID WC  . insulin glargine  10 Units Subcutaneous Daily  . isosorbide mononitrate  15 mg Oral Daily  . latanoprost  1 drop Both Eyes QHS  . metoCLOPramide (REGLAN) injection  10 mg Intravenous Q8H  . metoprolol succinate  12.5 mg Oral Daily  . nystatin  5 mL Oral QID  . ondansetron  4 mg Oral Once  . pantoprazole  40 mg Oral BID  . vitamin B-12  200 mcg Oral Daily   Continuous Infusions: . sodium chloride 100 mL/hr at 07/30/20 0622     LOS:  0 days   Cherene Altes, MD [facility] Office  [phone] Pager - Text Page per Amion  If 7PM-7AM, please contact night-coverage per Amion 07/30/2020, 9:55 AM
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
## 2020-07-31 DIAGNOSIS — I480 Paroxysmal atrial fibrillation: Secondary | ICD-10-CM | POA: Diagnosis present

## 2020-07-31 DIAGNOSIS — I5032 Chronic diastolic (congestive) heart failure: Secondary | ICD-10-CM | POA: Diagnosis present

## 2020-07-31 DIAGNOSIS — R112 Nausea with vomiting, unspecified: Secondary | ICD-10-CM | POA: Diagnosis not present

## 2020-07-31 DIAGNOSIS — Z86010 Personal history of colonic polyps: Secondary | ICD-10-CM | POA: Diagnosis not present

## 2020-07-31 DIAGNOSIS — F332 Major depressive disorder, recurrent severe without psychotic features: Secondary | ICD-10-CM | POA: Diagnosis present

## 2020-07-31 DIAGNOSIS — Z8673 Personal history of transient ischemic attack (TIA), and cerebral infarction without residual deficits: Secondary | ICD-10-CM | POA: Diagnosis not present

## 2020-07-31 DIAGNOSIS — I130 Hypertensive heart and chronic kidney disease with heart failure and stage 1 through stage 4 chronic kidney disease, or unspecified chronic kidney disease: Secondary | ICD-10-CM | POA: Diagnosis present

## 2020-07-31 DIAGNOSIS — I252 Old myocardial infarction: Secondary | ICD-10-CM | POA: Diagnosis not present

## 2020-07-31 DIAGNOSIS — E1122 Type 2 diabetes mellitus with diabetic chronic kidney disease: Secondary | ICD-10-CM | POA: Diagnosis present

## 2020-07-31 DIAGNOSIS — E1143 Type 2 diabetes mellitus with diabetic autonomic (poly)neuropathy: Secondary | ICD-10-CM | POA: Diagnosis present

## 2020-07-31 DIAGNOSIS — L0231 Cutaneous abscess of buttock: Secondary | ICD-10-CM | POA: Diagnosis present

## 2020-07-31 DIAGNOSIS — R1032 Left lower quadrant pain: Secondary | ICD-10-CM

## 2020-07-31 DIAGNOSIS — E440 Moderate protein-calorie malnutrition: Secondary | ICD-10-CM | POA: Diagnosis present

## 2020-07-31 DIAGNOSIS — R109 Unspecified abdominal pain: Secondary | ICD-10-CM | POA: Diagnosis present

## 2020-07-31 DIAGNOSIS — E876 Hypokalemia: Secondary | ICD-10-CM | POA: Diagnosis not present

## 2020-07-31 DIAGNOSIS — N182 Chronic kidney disease, stage 2 (mild): Secondary | ICD-10-CM | POA: Diagnosis present

## 2020-07-31 DIAGNOSIS — Z8616 Personal history of COVID-19: Secondary | ICD-10-CM | POA: Diagnosis not present

## 2020-07-31 DIAGNOSIS — I2510 Atherosclerotic heart disease of native coronary artery without angina pectoris: Secondary | ICD-10-CM | POA: Diagnosis present

## 2020-07-31 DIAGNOSIS — E1169 Type 2 diabetes mellitus with other specified complication: Secondary | ICD-10-CM | POA: Diagnosis present

## 2020-07-31 DIAGNOSIS — Z91128 Patient's intentional underdosing of medication regimen for other reason: Secondary | ICD-10-CM | POA: Diagnosis not present

## 2020-07-31 DIAGNOSIS — R1030 Lower abdominal pain, unspecified: Secondary | ICD-10-CM | POA: Diagnosis present

## 2020-07-31 DIAGNOSIS — E1165 Type 2 diabetes mellitus with hyperglycemia: Secondary | ICD-10-CM | POA: Diagnosis present

## 2020-07-31 DIAGNOSIS — K3184 Gastroparesis: Secondary | ICD-10-CM | POA: Diagnosis present

## 2020-07-31 DIAGNOSIS — E860 Dehydration: Secondary | ICD-10-CM | POA: Diagnosis present

## 2020-07-31 DIAGNOSIS — Z86718 Personal history of other venous thrombosis and embolism: Secondary | ICD-10-CM | POA: Diagnosis not present

## 2020-07-31 DIAGNOSIS — T465X6A Underdosing of other antihypertensive drugs, initial encounter: Secondary | ICD-10-CM | POA: Diagnosis present

## 2020-07-31 DIAGNOSIS — E1149 Type 2 diabetes mellitus with other diabetic neurological complication: Secondary | ICD-10-CM | POA: Diagnosis not present

## 2020-07-31 DIAGNOSIS — E785 Hyperlipidemia, unspecified: Secondary | ICD-10-CM | POA: Diagnosis present

## 2020-07-31 LAB — GLUCOSE, CAPILLARY
Glucose-Capillary: 106 mg/dL — ABNORMAL HIGH (ref 70–99)
Glucose-Capillary: 190 mg/dL — ABNORMAL HIGH (ref 70–99)
Glucose-Capillary: 116 mg/dL — ABNORMAL HIGH (ref 70–99)
Glucose-Capillary: 174 mg/dL — ABNORMAL HIGH (ref 70–99)

## 2020-07-31 LAB — BASIC METABOLIC PANEL
Anion gap: 6 (ref 5–15)
BUN: 9 mg/dL (ref 8–23)
CO2: 25 mmol/L (ref 22–32)
Calcium: 8.3 mg/dL — ABNORMAL LOW (ref 8.9–10.3)
Chloride: 106 mmol/L (ref 98–111)
Creatinine, Ser: 0.95 mg/dL (ref 0.44–1.00)
GFR, Estimated: >60 mL/min (ref >60)
Glucose, Bld: 107 mg/dL — ABNORMAL HIGH (ref 70–99)
Potassium: 3.3 mmol/L — ABNORMAL LOW (ref 3.5–5.1)
Sodium: 137 mmol/L (ref 135–145)

## 2020-07-31 LAB — CBC
HCT: 30.9 % — ABNORMAL LOW (ref 36.0–46.0)
Hemoglobin: 10.4 g/dL — ABNORMAL LOW (ref 12.0–15.0)
MCH: 30.3 pg (ref 26.0–34.0)
MCHC: 33.7 g/dL (ref 30.0–36.0)
MCV: 90.1 fL (ref 80.0–100.0)
Platelets: 235 10*3/uL (ref 150–400)
RBC: 3.43 MIL/uL — ABNORMAL LOW (ref 3.87–5.11)
RDW: 14.1 % (ref 11.5–15.5)
WBC: 8.4 10*3/uL (ref 4.0–10.5)
nRBC: 0 % (ref 0.0–0.2)

## 2020-07-31 LAB — MAGNESIUM: Magnesium: 1.5 mg/dL — ABNORMAL LOW (ref 1.7–2.4)

## 2020-07-31 MED ORDER — POTASSIUM CHLORIDE 10 MEQ/100ML IV SOLN
10.0000 meq | INTRAVENOUS | Status: AC
  Administered 2020-07-31 (×4): 10 meq via INTRAVENOUS
  Filled 2020-07-31 (×4): qty 100

## 2020-07-31 MED ORDER — MAGNESIUM SULFATE 2 GM/50ML IV SOLN
2.0000 g | Freq: Once | INTRAVENOUS | Status: AC
  Administered 2020-07-31: 2 g via INTRAVENOUS
  Filled 2020-07-31 (×2): qty 50

## 2020-07-31 NOTE — Progress Notes (Signed)
Maria Fox  OEV:[id number] DOB: [birth year] DOA: 07/29/2020 PCP: [name], MD    Brief Narrative:  [phone] with a history of DM2, diabetic neuropathy, HTN, HLD, CAD status post CABG, paroxysmal atrial fibrillation on chronic Eliquis, CKD stage II, and chronic diastolic CHF who was hospitalized 5/2 >07/27/2020 with Klebsiella pneumonia a acute cystitis and intractable nausea and vomiting.  She was ultimately diagnosed with a functional GI disorder after an unremarkable work-up.  She returned to the ER stating that her abdominal pain never improved and that she continued to have nausea and vomiting.  She was incidentally noted to have a draining buttock abscess in the ED.  Significant Events:  5/2 > 5/19 admission with cystitis and functional GI disorder 5/21 readmit with same abdominal symptoms  Consultants:  Gastroenterology  Code Status: FULL CODE  Antimicrobials:  Rocephin 5/21  DVT prophylaxis: Subcutaneous heparin  Subjective: Afebrile.  Vital signs stable. Reports ongoing L sided abdom pain, unrelenting nausea, but no vomiting. Has no appetite. Denies cp, sob, or HA.   Assessment & Plan:  Intractable nausea/vomiting/abdominal pain -functional GI disorder Extensive work-up being pursued by gastroenterology  -continue empiric course of Reglan for possible gastroparesis and strive for strict CBG control  Modest hypokalemia Continue to supplement and follow  Hypomagnesemia Likely due to poor intake -supplement to goal of 2.0  Acute kidney injury Creatinine 0.9 at time of discharge and 1.16 at time of return -quickly returned to baseline with gentle hydration  Recent Labs  Lab 07/29/20 1137 07/30/20 0636 07/31/20 0156  CREATININE 1.16* 0.95 0.95    Uncontrolled HTN Noncompliant with blood pressure medications due to limited ability to tolerate oral intake - blood pressure presently well controlled  Uncontrolled DM2 with hyperglycemia Strict control of CBG a  must to control diabetic gastroparesis -CBG at goal presently  Paroxysmal atrial fibrillation Continue usual Eliquis dosing  CAD status post CABG Asymptomatic  Draining wounds of the left buttock Monitor with local wound care  Family Communication:  Status is: Inpatient  Remains inpatient appropriate because:Inpatient level of care appropriate due to severity of illness   Dispo: The patient is from: Home              Anticipated d/c is to: Home              Patient currently is not medically stable to d/c.   Difficult to place patient No   Objective: Blood pressure (!) 110/56, pulse 80, temperature 97.8 F (36.6 C), temperature source Oral, resp. rate 16, height 5\' 3"  (1.6 m), weight 63.6 kg, SpO2 100 %.  Intake/Output Summary (Last 24 hours) at 07/31/2020 0936 Last data filed at 07/31/2020 0558 Gross per 24 hour  Intake 1328.22 ml  Output 0 ml  Net 1328.22 ml   Filed Weights   07/29/20 1133 07/29/20 2229  Weight: 58.1 kg 63.6 kg    Examination: General: No acute respiratory distress Lungs: Clear bilaterally Cardiovascular: Regular rate  Abdomen: Nondistended Extremities: No significant edema bilateral lower extremities  CBC: Recent Labs  Lab 07/29/20 1137 07/30/20 0636 07/31/20 0156  WBC 12.1* 8.5 8.4  NEUTROABS 10.1*  --   --   HGB 13.5 11.6* 10.4*  HCT 40.9 34.7* 30.9*  MCV 89.9 89.4 90.1  PLT 336 274 371   Basic Metabolic Panel: Recent Labs  Lab 07/29/20 1137 07/30/20 0636 07/31/20 0156  NA 135 137 137  K 3.7 3.3* 3.3*  CL 100 107 106  CO2 20* 20* 25  GLUCOSE 320* 152* 107*  BUN 10 7* 9  CREATININE 1.16* 0.95 0.95  CALCIUM 9.3 8.5* 8.3*  MG  --   --  1.5*   GFR: Estimated Creatinine Clearance: 44.5 mL/min (by C-G formula based on SCr of 0.95 mg/dL).  Liver Function Tests: Recent Labs  Lab 07/29/20 1137  AST 22  ALT 20  ALKPHOS 109  BILITOT 1.2  PROT 7.8  ALBUMIN 3.7   Recent Labs  Lab 07/29/20 1137  LIPASE 24     HbA1C: Hgb A1c MFr Bld  Date/Time Value Ref Range Status  07/03/2020 09:37 AM 11.6 (H) 4.8 - 5.6 % Final    Comment:    (NOTE) Pre diabetes:          5.7%-6.4%  Diabetes:              >6.4%  Glycemic control for   <7.0% adults with diabetes   05/02/2020 03:25 PM 12.2 (H) 4.6 - 6.5 % Final    Comment:    Glycemic Control Guidelines for People with Diabetes:Non Diabetic:  <6%Goal of Therapy: <7%Additional Action Suggested:  >8%     CBG: Recent Labs  Lab 07/30/20 1232 07/30/20 1705 07/30/20 1818 07/30/20 2036 07/31/20 0758  GLUCAP 73 176* 183* 192* 106*    Recent Results (from the past 240 hour(s))  Urine culture     Status: None   Collection Time: 07/29/20 11:39 AM   Specimen: Urine, Random  Result Value Ref Range Status   Specimen Description URINE, RANDOM  Final   Special Requests NONE  Final   Culture   Final    NO GROWTH Performed at [facility], [place] [address], [place], [place] [address]    Report Status 07/30/2020 FINAL  Final  SARS CORONAVIRUS 2 (TAT 6-24 HRS) Nasopharyngeal Nasopharyngeal Swab     Status: None   Collection Time: 07/29/20  9:50 PM   Specimen: Nasopharyngeal Swab  Result Value Ref Range Status   SARS Coronavirus 2 NEGATIVE NEGATIVE Final    Comment: (NOTE) SARS-CoV-2 target nucleic acids are NOT DETECTED.  The SARS-CoV-2 RNA is generally detectable in upper and lower respiratory specimens during the acute phase of infection. Negative results do not preclude SARS-CoV-2 infection, do not rule out co-infections with other pathogens, and should not be used as the sole basis for treatment or other patient management decisions. Negative results must be combined with clinical observations, patient history, and epidemiological information. The expected result is Negative.  Fact Sheet for Patients: SugarRoll.be  Fact Sheet for Healthcare Providers: https://www.woods-mathews.com/  This test  is not yet approved or cleared by the [place] FDA and  has been authorized for detection and/or diagnosis of SARS-CoV-2 by FDA under an Emergency Use Authorization (EUA). This EUA will remain  in effect (meaning this test can be used) for the duration of the COVID-19 declaration under Se ction 564(b)(1) of the Act, 21 U.S.C. section 360bbb-3(b)(1), unless the authorization is terminated or revoked sooner.  Performed at [facility], [place] [address] [place],  [address]      Scheduled Meds: . apixaban  5 mg Oral BID  . clopidogrel  75 mg Oral Daily  . DULoxetine  20 mg Oral Daily  . gabapentin  300 mg Oral BID  . insulin aspart  0-15 Units Subcutaneous TID WC  . insulin glargine  10 Units Subcutaneous Daily  . isosorbide mononitrate  15 mg Oral Daily  . latanoprost  1 drop Both Eyes  QHS  . metoCLOPramide (REGLAN) injection  10 mg Intravenous Q6H  . metoprolol succinate  12.5 mg Oral Daily  . nystatin  5 mL Oral QID  . ondansetron  4 mg Intravenous Q6H  . pantoprazole  40 mg Oral BID  . vitamin B-12  200 mcg Oral Daily   Continuous Infusions: . sodium chloride 75 mL/hr at 07/31/20 0522     LOS: 0 days   Cherene Altes, MD [facility] Office  [phone] Pager - Text Page per Shea Evans  If 7PM-7AM, please contact night-coverage per Amion 07/31/2020, 9:36 AM

## 2020-07-31 NOTE — Plan of Care (Signed)
  Problem: Education: Goal: Knowledge of General Education information will improve Description Including pain rating scale, medication(s)/side effects and non-pharmacologic comfort measures Outcome: Progressing   

## 2020-07-31 NOTE — Progress Notes (Addendum)
[facility] Progress Note  Chief Complaint: Left lower quadrant abdominal pain and nausea  History:  Signout received from Dr. Tarri Glenn, recent GI consult on progress notes also reviewed.  I reviewed my outpatient EGD and colonoscopy reports from January 2021.  Sahian has weeks of persistent left lower quadrant pain and nausea with poor oral intake.  It may have been triggered by a UTI recently, but symptoms are not abating despite negative work-up and multiple medications tried. Her appetite remains poor, she says she is only allowed to have a liquid diet, but is hopeful she may be able to tolerate some regular food and would like to try.  She describes the left lower quadrant pain as deep and like a constant throb with intermittent sharp worsening.  It is worse if she lays toward the left side.  It is not associated with bowel movements, and she had semiformed BM earlier today without bleeding.  ROS: Cardiovascular: Denies chest pain Respiratory: Denies dyspnea Urinary: Denies dysuria Admits to depression in recent months Objective:   Current Facility-Administered Medications:  .  0.9 %  sodium chloride infusion, , Intravenous, Continuous, Cherene Altes, MD, Last Rate: 75 mL/hr at 07/31/20 0522, New Bag at 07/31/20 0522 .  acetaminophen (TYLENOL) tablet 500 mg, 500 mg, Oral, Q6H PRN, Wynetta Fines T, MD, 500 mg at 07/29/20 2245 .  albuterol (PROVENTIL) (2.5 MG/3ML) 0.083% nebulizer solution 2.5 mg, 2.5 mg, Inhalation, Q6H PRN, Wynetta Fines T, MD .  apixaban (ELIQUIS) tablet 5 mg, 5 mg, Oral, BID, Wynetta Fines T, MD, 5 mg at 07/31/20 0953 .  clopidogrel (PLAVIX) tablet 75 mg, 75 mg, Oral, Daily, Wynetta Fines T, MD, 75 mg at 07/31/20 0953 .  DULoxetine (CYMBALTA) DR capsule 20 mg, 20 mg, Oral, Daily, Wynetta Fines T, MD, 20 mg at 07/31/20 0953 .  gabapentin (NEURONTIN) capsule 300 mg, 300 mg, Oral, BID, Wynetta Fines T, MD, 300 mg at 07/31/20 4098 .  hydrALAZINE (APRESOLINE) tablet 25 mg, 25  mg, Oral, Q6H PRN, Wynetta Fines T, MD, 25 mg at 07/29/20 2248 .  insulin aspart (novoLOG) injection 0-15 Units, 0-15 Units, Subcutaneous, TID WC, Lequita Halt, MD, 3 Units at 07/31/20 1239 .  insulin glargine (LANTUS) injection 10 Units, 10 Units, Subcutaneous, Daily, Lequita Halt, MD, 10 Units at 07/31/20 [phone] .  isosorbide mononitrate (IMDUR) 24 hr tablet 15 mg, 15 mg, Oral, Daily, Wynetta Fines T, MD, 15 mg at 07/31/20 [phone] .  latanoprost (XALATAN) 0.005 % ophthalmic solution 1 drop, 1 drop, Both Eyes, QHS, Lequita Halt, MD, 1 drop at 07/30/20 2034 .  metoCLOPramide (REGLAN) injection 10 mg, 10 mg, Intravenous, Q6H, Cherene Altes, MD, 10 mg at 07/31/20 1203 .  metoprolol succinate (TOPROL-XL) 24 hr tablet 12.5 mg, 12.5 mg, Oral, Daily, Wynetta Fines T, MD, 12.5 mg at 07/31/20 9562 .  nystatin (MYCOSTATIN) 100000 UNIT/ML suspension 500,000 Units, 5 mL, Oral, QID, Karmen Bongo, MD, 500,000 Units at 07/31/20 [phone] .  ondansetron (ZOFRAN) injection 4 mg, 4 mg, Intravenous, Q6H, Esterwood, Amy S, PA-C, 4 mg at 07/31/20 0804 .  oxyCODONE (Oxy IR/ROXICODONE) immediate release tablet 5 mg, 5 mg, Oral, Q8H PRN, Cherene Altes, MD, 5 mg at 07/31/20 1029 .  pantoprazole (PROTONIX) EC tablet 40 mg, 40 mg, Oral, BID, Wynetta Fines T, MD, 40 mg at 07/31/20 0953 .  polyethylene glycol (MIRALAX / GLYCOLAX) packet 17 g, 17 g, Oral, Daily PRN, Wynetta Fines T, MD .  potassium chloride 10 mEq in  100 mL IVPB, 10 mEq, Intravenous, Q1 Hr x 4, McClung, Jeffrey T, MD, Last Rate: 100 mL/hr at 07/31/20 1242, 10 mEq at 07/31/20 1242 .  vitamin B-12 (CYANOCOBALAMIN) tablet 200 mcg, 200 mcg, Oral, Daily, Wynetta Fines T, MD, 200 mcg at 07/31/20 [phone]  . sodium chloride 75 mL/hr at 07/31/20 0522  . potassium chloride 10 mEq (07/31/20 1242)     Vital signs in last 24 hrs: Vitals:   07/31/20 0540 07/31/20 0956  BP: (!) 110/56 (!) 149/78  Pulse: 80 80  Resp: 16 16  Temp: 97.8 F (36.6 C) 97.6 F (36.4 C)  SpO2: 100%  99%    Intake/Output Summary (Last 24 hours) at 07/31/2020 1346 Last data filed at 07/31/2020 0959 Gross per 24 hour  Intake 1328.22 ml  Output --  Net 1328.22 ml     Physical Exam Chronically ill-appearing, depressed affect.  Laying in bed feeling unwell.  HEENT: sclera anicteric, oral mucosa without lesions  Neck: supple, no thyromegaly, JVD or lymphadenopathy  Cardiac: RRR without murmurs, S1S2 heard, no peripheral edema  Pulm: clear to auscultation bilaterally, normal RR and effort noted  Abdomen: soft, mild tenderness LLQ just above inguinal region, with active bowel sounds. No guarding or palpable hepatosplenomegaly  Skin; warm and dry, no jaundice  Recent Labs:  CBC Latest Ref Rng & Units 07/31/2020 07/30/2020 07/29/2020  WBC 4.0 - 10.5 K/uL 8.4 8.5 12.1(H)  Hemoglobin 12.0 - 15.0 g/dL 10.4(L) 11.6(L) 13.5  Hematocrit 36.0 - 46.0 % 30.9(L) 34.7(L) 40.9  Platelets 150 - 400 K/uL 235 274 336    No results for input(s): INR in the last 168 hours. CMP Latest Ref Rng & Units 07/31/2020 07/30/2020 07/29/2020  Glucose 70 - 99 mg/dL 107(H) 152(H) 320(H)  BUN 8 - 23 mg/dL 9 7(L) 10  Creatinine 0.44 - 1.00 mg/dL 0.95 0.95 1.16(H)  Sodium 135 - 145 mmol/L 137 137 135  Potassium 3.5 - 5.1 mmol/L 3.3(L) 3.3(L) 3.7  Chloride 98 - 111 mmol/L 106 107 100  CO2 22 - 32 mmol/L 25 20(L) 20(L)  Calcium 8.9 - 10.3 mg/dL 8.3(L) 8.5(L) 9.3  Total Protein 6.5 - 8.1 g/dL - - 7.8  Total Bilirubin 0.3 - 1.2 mg/dL - - 1.2  Alkaline Phos 38 - 126 U/L - - 109  AST 15 - 41 U/L - - 22  ALT 0 - 44 U/L - - 20     Radiologic studies: CLINICAL DATA:  Left lower quadrant pain.   EXAM: CT ABDOMEN AND PELVIS WITHOUT CONTRAST   TECHNIQUE: Multidetector CT imaging of the abdomen and pelvis was performed following the standard protocol without IV contrast.   COMPARISON:  07/23/2020   FINDINGS: Lower chest: No acute findings. Left pleural effusion resolved and residual left pleural thickening  is noted. Soft tissue thickening along the left heart border again noted which may be due to pleural or pericardial thickening.   Hepatobiliary: No mass visualized on this unenhanced exam. Prior cholecystectomy. No evidence of biliary obstruction.   Pancreas: No mass or inflammatory process visualized on this unenhanced exam.   Spleen:  Within normal limits in size.   Adrenals/Urinary tract: No evidence of urolithiasis or hydronephrosis. Unremarkable unopacified urinary bladder.   Stomach/Bowel: No evidence of obstruction, inflammatory process, or abnormal fluid collections. Normal appendix visualized. Mild diverticulosis is seen mainly involving the descending and sigmoid colon, however there is no evidence of diverticulitis.   Vascular/Lymphatic: No pathologically enlarged lymph nodes identified. No evidence of abdominal aortic aneurysm.  Aortic atherosclerotic calcification noted.   Reproductive:  No mass or other significant abnormality.   Other:  None.   Musculoskeletal:  No suspicious bone lesions identified.   IMPRESSION: No acute findings.   Colonic diverticulosis, without radiographic evidence of diverticulitis.   Aortic Atherosclerosis (ICD10-I70.0).     Electronically Signed   By: Marlaine Hind M.D.   On: 07/30/2020 17:48   Assessment & Plan  Assessment: Persistent LLQ/inguinal pain, difficult to characterize.  No cause found on repeated imaging studies.  It has persisted after treatment of UTI during recent admission.  I suspect that this patient has an underlying functional bowel disorder which has periodically flared in the past with certain triggers such as infection.  I do not know she is having such persistent difficulty at this time, and I also wonder whether there could be a musculoskeletal cause of this pain given her description of it.  Persistent nausea that may be ancillary effect of the pain, but I think a significant contributing factor is a  poorly controlled diabetes with a hemoglobin A1c persistently above 11.  She has not yet improved with multiple medications tried.  Kailly would like to try slowly increasing her diet, so I will order soft diet.  I would be cautious with the use of opiates in this situation, risk of dependency and worsening her nausea.  I would like to give her some more time on standing dose metoclopramide and see if it helps with her persistent nausea and allows her to take nutrition. My impression is that endoscopic procedures would be of low yield.  Consider psychiatry consultation for her depression.  Discussed with Dr. Thereasa Solo.   28 minutes were spent on this encounter (including chart review, history/exam, counseling/coordination of care, and documentation) > 50% of that time was spent on counseling and coordination of care.   Nelida Meuse III Office: [phone]

## 2020-08-01 DIAGNOSIS — R112 Nausea with vomiting, unspecified: Secondary | ICD-10-CM | POA: Diagnosis not present

## 2020-08-01 DIAGNOSIS — R1032 Left lower quadrant pain: Secondary | ICD-10-CM | POA: Diagnosis not present

## 2020-08-01 DIAGNOSIS — R109 Unspecified abdominal pain: Secondary | ICD-10-CM | POA: Diagnosis not present

## 2020-08-01 LAB — COMPREHENSIVE METABOLIC PANEL
ALT: 13 U/L (ref 0–44)
AST: 18 U/L (ref 15–41)
Albumin: 2.7 g/dL — ABNORMAL LOW (ref 3.5–5.0)
Alkaline Phosphatase: 73 U/L (ref 38–126)
Anion gap: 6 (ref 5–15)
BUN: <5 mg/dL — ABNORMAL LOW (ref 8–23)
CO2: 23 mmol/L (ref 22–32)
Calcium: 8.5 mg/dL — ABNORMAL LOW (ref 8.9–10.3)
Chloride: 108 mmol/L (ref 98–111)
Creatinine, Ser: 0.92 mg/dL (ref 0.44–1.00)
GFR, Estimated: >60 mL/min (ref >60)
Glucose, Bld: 96 mg/dL (ref 70–99)
Potassium: 3.7 mmol/L (ref 3.5–5.1)
Sodium: 137 mmol/L (ref 135–145)
Total Bilirubin: 0.4 mg/dL (ref 0.3–1.2)
Total Protein: 5.7 g/dL — ABNORMAL LOW (ref 6.5–8.1)

## 2020-08-01 LAB — CBC
HCT: 31.6 % — ABNORMAL LOW (ref 36.0–46.0)
Hemoglobin: 10.5 g/dL — ABNORMAL LOW (ref 12.0–15.0)
MCH: 29.7 pg (ref 26.0–34.0)
MCHC: 33.2 g/dL (ref 30.0–36.0)
MCV: 89.3 fL (ref 80.0–100.0)
Platelets: 265 10*3/uL (ref 150–400)
RBC: 3.54 MIL/uL — ABNORMAL LOW (ref 3.87–5.11)
RDW: 13.8 % (ref 11.5–15.5)
WBC: 7.3 10*3/uL (ref 4.0–10.5)
nRBC: 0 % (ref 0.0–0.2)

## 2020-08-01 LAB — MAGNESIUM: Magnesium: 1.9 mg/dL (ref 1.7–2.4)

## 2020-08-01 LAB — GLUCOSE, CAPILLARY
Glucose-Capillary: 101 mg/dL — ABNORMAL HIGH (ref 70–99)
Glucose-Capillary: 180 mg/dL — ABNORMAL HIGH (ref 70–99)
Glucose-Capillary: 186 mg/dL — ABNORMAL HIGH (ref 70–99)
Glucose-Capillary: 114 mg/dL — ABNORMAL HIGH (ref 70–99)

## 2020-08-01 MED ORDER — LIP MEDEX EX OINT
TOPICAL_OINTMENT | CUTANEOUS | Status: DC | PRN
  Filled 2020-08-01: qty 7

## 2020-08-01 MED ORDER — HYDROXYZINE HCL 10 MG PO TABS
10.0000 mg | ORAL_TABLET | Freq: Three times a day (TID) | ORAL | Status: DC | PRN
  Administered 2020-08-01 – 2020-08-04 (×3): 10 mg via ORAL
  Filled 2020-08-01 (×5): qty 1

## 2020-08-01 MED ORDER — INSULIN GLARGINE 100 UNIT/ML ~~LOC~~ SOLN
14.0000 [IU] | Freq: Every day | SUBCUTANEOUS | Status: DC
  Administered 2020-08-02 – 2020-08-05 (×4): 14 [IU] via SUBCUTANEOUS
  Filled 2020-08-01 (×4): qty 0.14

## 2020-08-01 MED ORDER — GLUCERNA SHAKE PO LIQD
237.0000 mL | Freq: Three times a day (TID) | ORAL | Status: DC
  Administered 2020-08-01 – 2020-08-05 (×9): 237 mL via ORAL

## 2020-08-01 MED ORDER — METOPROLOL SUCCINATE ER 25 MG PO TB24
25.0000 mg | ORAL_TABLET | Freq: Every day | ORAL | Status: DC
  Administered 2020-08-02 – 2020-08-05 (×4): 25 mg via ORAL
  Filled 2020-08-01 (×4): qty 1

## 2020-08-01 NOTE — Progress Notes (Signed)
Maria Fox  QPY:[id number] DOB: November 18, 1945 DOA: 07/29/2020 PCP: [name], MD    Brief Narrative:  [phone] with a history of DM2, diabetic neuropathy, HTN, HLD, CAD status post CABG, paroxysmal atrial fibrillation on chronic Eliquis, CKD stage II, and chronic diastolic CHF who was hospitalized 5/2 >07/27/2020 with Klebsiella pneumonia a acute cystitis and intractable nausea and vomiting.  She was ultimately diagnosed with a functional GI disorder after an unremarkable work-up.  She returned to the ER stating that her abdominal pain never improved and that she continued to have nausea and vomiting.  She was incidentally noted to have a draining buttock abscess in the ED.  Significant Events:  5/2 > 5/19 admission with cystitis and functional GI disorder 5/21 readmit with same abdominal symptoms 5/22 CT abdom/pelvix - no acute findings   Consultants:  Gastroenterology  Code Status: FULL CODE  Antimicrobials:  Rocephin 5/21  DVT prophylaxis: Subcutaneous heparin  Subjective: Remains afebrile.  Vitals otherwise stable.  Electrolytes balanced.  WBC normal.  Hemoglobin stable.  Patient tells me she is feeling a little better overall.  She still is not eating or drinking to a significant extent however because of some persisting nausea.  She denies chest pain or shortness of breath.  When asked to localize her pain from a she points to an area of the left lower quadrant but not in the area of the hip.  She has no tenderness whatsoever on deep palpation and manipulation of the left hip joint.  Assessment & Plan:  Intractable nausea/vomiting/abdominal pain -functional GI disorder continue empiric course of Reglan for possible gastroparesis and strive for strict CBG control -further thoughts as noted by gastroenterology -hopeful with ongoing Reglan use the patient's oral intake will gradually improved to the point that she can be safely discharged home  Modest hypokalemia Corrected with  supplementation  Hypomagnesemia due to poor intake -corrected with supplementation  Acute kidney injury Creatinine 0.9 at time of discharge and 1.16 at time of return -quickly returned to baseline with gentle hydration  Uncontrolled HTN Noncompliant with blood pressure medications due to limited ability to tolerate oral intake - blood pressure trending upward -adjust medical therapy and follow  Uncontrolled DM2 with hyperglycemia Strict control of CBG a must to control diabetic gastroparesis -CBG trending upward therefore slight adjustments will be made in her treatment regimen  Paroxysmal atrial fibrillation Continue usual Eliquis dosing -no RVR presently  CAD status post CABG Asymptomatic  Draining wounds of the left buttock Monitor with local wound care  Family Communication: No family present at time of exam Status is: Inpatient  Remains inpatient appropriate because:Inpatient level of care appropriate due to severity of illness   Dispo: The patient is from: Home              Anticipated d/c is to: Home              Patient currently is not medically stable to d/c.   Difficult to place patient No   Objective: Blood pressure (!) 157/81, pulse 87, temperature 98.1 F (36.7 C), temperature source Oral, resp. rate 15, height 5\' 3"  (1.6 m), weight 63.6 kg, SpO2 94 %.  Intake/Output Summary (Last 24 hours) at 08/01/2020 1119 Last data filed at 08/01/2020 0426 Gross per 24 hour  Intake 2138.35 ml  Output 1100 ml  Net 1038.35 ml   Filed Weights   07/29/20 1133 07/29/20 2229  Weight: 58.1 kg 63.6 kg    Examination: General: No acute respiratory  distress Lungs: Clear bilaterally without wheezing Cardiovascular: Regular rate without murmur Abdomen: Nondistended, soft, bowel sounds present Extremities: No significant edema bilateral lower extremities -no tenderness to deep palpation over left hip joint and upper anterior thigh  CBC: Recent Labs  Lab 07/29/20 1137  07/30/20 0636 07/31/20 0156 08/01/20 0101  WBC 12.1* 8.5 8.4 7.3  NEUTROABS 10.1*  --   --   --   HGB 13.5 11.6* 10.4* 10.5*  HCT 40.9 34.7* 30.9* 31.6*  MCV 89.9 89.4 90.1 89.3  PLT 336 274 235 130   Basic Metabolic Panel: Recent Labs  Lab 07/30/20 0636 07/31/20 0156 08/01/20 0101  NA 137 137 137  K 3.3* 3.3* 3.7  CL 107 106 108  CO2 20* 25 23  GLUCOSE 152* 107* 96  BUN 7* 9 <5*  CREATININE 0.95 0.95 0.92  CALCIUM 8.5* 8.3* 8.5*  MG  --  1.5* 1.9   GFR: Estimated Creatinine Clearance: 46 mL/min (by C-G formula based on SCr of 0.92 mg/dL).  Liver Function Tests: Recent Labs  Lab 07/29/20 1137 08/01/20 0101  AST 22 18  ALT 20 13  ALKPHOS 109 73  BILITOT 1.2 0.4  PROT 7.8 5.7*  ALBUMIN 3.7 2.7*   Recent Labs  Lab 07/29/20 1137  LIPASE 24    HbA1C: Hgb A1c MFr Bld  Date/Time Value Ref Range Status  07/03/2020 09:37 AM 11.6 (H) 4.8 - 5.6 % Final    Comment:    (NOTE) Pre diabetes:          5.7%-6.4%  Diabetes:              >6.4%  Glycemic control for   <7.0% adults with diabetes   05/02/2020 03:25 PM 12.2 (H) 4.6 - 6.5 % Final    Comment:    Glycemic Control Guidelines for People with Diabetes:Non Diabetic:  <6%Goal of Therapy: <7%Additional Action Suggested:  >8%     CBG: Recent Labs  Lab 07/31/20 0758 07/31/20 1212 07/31/20 1701 07/31/20 2034 08/01/20 0812  GLUCAP 106* 190* 116* 174* 101*    Recent Results (from the past 240 hour(s))  Urine culture     Status: None   Collection Time: 07/29/20 11:39 AM   Specimen: Urine, Random  Result Value Ref Range Status   Specimen Description URINE, RANDOM  Final   Special Requests NONE  Final   Culture   Final    NO GROWTH Performed at [facility], [place] [address], [place],  [address]    Report Status 07/30/2020 FINAL  Final  SARS CORONAVIRUS 2 (TAT 6-24 HRS) Nasopharyngeal Nasopharyngeal Swab     Status: None   Collection Time: 07/29/20  9:50 PM   Specimen: Nasopharyngeal Swab   Result Value Ref Range Status   SARS Coronavirus 2 NEGATIVE NEGATIVE Final    Comment: (NOTE) SARS-CoV-2 target nucleic acids are NOT DETECTED.  The SARS-CoV-2 RNA is generally detectable in upper and lower respiratory specimens during the acute phase of infection. Negative results do not preclude SARS-CoV-2 infection, do not rule out co-infections with other pathogens, and should not be used as the sole basis for treatment or other patient management decisions. Negative results must be combined with clinical observations, patient history, and epidemiological information. The expected result is Negative.  Fact Sheet for Patients: SugarRoll.be  Fact Sheet for Healthcare Providers: https://www.woods-mathews.com/  This test is not yet approved or cleared by the [place] FDA and  has been authorized for detection and/or diagnosis of SARS-CoV-2  by FDA under an Emergency Use Authorization (EUA). This EUA will remain  in effect (meaning this test can be used) for the duration of the COVID-19 declaration under Se ction 564(b)(1) of the Act, 21 U.S.C. section 360bbb-3(b)(1), unless the authorization is terminated or revoked sooner.  Performed at [facility], [place] [address], [place], [place] [address]      Scheduled Meds: . apixaban  5 mg Oral BID  . clopidogrel  75 mg Oral Daily  . DULoxetine  20 mg Oral Daily  . feeding supplement (GLUCERNA SHAKE)  237 mL Oral TID BM  . gabapentin  300 mg Oral BID  . insulin aspart  0-15 Units Subcutaneous TID WC  . insulin glargine  10 Units Subcutaneous Daily  . isosorbide mononitrate  15 mg Oral Daily  . latanoprost  1 drop Both Eyes QHS  . metoCLOPramide (REGLAN) injection  10 mg Intravenous Q6H  . metoprolol succinate  12.5 mg Oral Daily  . nystatin  5 mL Oral QID  . ondansetron  4 mg Intravenous Q6H  . pantoprazole  40 mg Oral BID  . vitamin B-12  200 mcg Oral Daily   Continuous  Infusions: . sodium chloride 60 mL/hr at 08/01/20 0426     LOS: 1 day   Cherene Altes, MD [facility] Office  [phone] Pager - Text Page per Shea Evans  If 7PM-7AM, please contact night-coverage per Amion 08/01/2020, 11:19 AM

## 2020-08-01 NOTE — Progress Notes (Signed)
[facility] Progress Note  Chief Complaint: Left lower quadrant pain and nausea  History:  Maria Fox feels about the same today.  She tried some roast beef and macaroni with cheese yesterday but only can tolerate a few bites before feeling nauseated in stopping She continues to have the same left lower quadrant pain, but she points to right over the left inguinal region and describes it as being deep and throbbing and sometimes sharp.  It is worse if she lays on the left side but not worse with leg movement.  She feels it is sometimes worse with urination but not bowel movements.  No rectal bleeding.  Stool consistency variable.  ROS: Cardiovascular: Denies chest pain Respiratory: Denies dyspnea Urinary: Denies dysuria  Objective:   Current Facility-Administered Medications:  .  0.9 %  sodium chloride infusion, , Intravenous, Continuous, Cherene Altes, MD, Last Rate: 60 mL/hr at 08/01/20 0426, Infusion Verify at 08/01/20 0426 .  acetaminophen (TYLENOL) tablet 500 mg, 500 mg, Oral, Q6H PRN, Wynetta Fines T, MD, 500 mg at 07/31/20 2116 .  albuterol (PROVENTIL) (2.5 MG/3ML) 0.083% nebulizer solution 2.5 mg, 2.5 mg, Inhalation, Q6H PRN, Wynetta Fines T, MD .  apixaban Arne Cleveland) tablet 5 mg, 5 mg, Oral, BID, Wynetta Fines T, MD, 5 mg at 07/31/20 2117 .  clopidogrel (PLAVIX) tablet 75 mg, 75 mg, Oral, Daily, Wynetta Fines T, MD, 75 mg at 07/31/20 0953 .  DULoxetine (CYMBALTA) DR capsule 20 mg, 20 mg, Oral, Daily, Wynetta Fines T, MD, 20 mg at 07/31/20 0953 .  gabapentin (NEURONTIN) capsule 300 mg, 300 mg, Oral, BID, Wynetta Fines T, MD, 300 mg at 07/31/20 2117 .  hydrALAZINE (APRESOLINE) tablet 25 mg, 25 mg, Oral, Q6H PRN, Wynetta Fines T, MD, 25 mg at 07/29/20 2248 .  insulin aspart (novoLOG) injection 0-15 Units, 0-15 Units, Subcutaneous, TID WC, Lequita Halt, MD, 3 Units at 07/31/20 1239 .  insulin glargine (LANTUS) injection 10 Units, 10 Units, Subcutaneous, Daily, Lequita Halt, MD, 10 Units at 07/31/20  [phone] .  isosorbide mononitrate (IMDUR) 24 hr tablet 15 mg, 15 mg, Oral, Daily, Wynetta Fines T, MD, 15 mg at 07/31/20 [phone] .  latanoprost (XALATAN) 0.005 % ophthalmic solution 1 drop, 1 drop, Both Eyes, QHS, Lequita Halt, MD, 1 drop at 07/31/20 2118 .  metoCLOPramide (REGLAN) injection 10 mg, 10 mg, Intravenous, Q6H, Cherene Altes, MD, 10 mg at 08/01/20 0532 .  metoprolol succinate (TOPROL-XL) 24 hr tablet 12.5 mg, 12.5 mg, Oral, Daily, Wynetta Fines T, MD, 12.5 mg at 07/31/20 3086 .  nystatin (MYCOSTATIN) 100000 UNIT/ML suspension 500,000 Units, 5 mL, Oral, QID, Karmen Bongo, MD, 500,000 Units at 07/31/20 2117 .  ondansetron (ZOFRAN) injection 4 mg, 4 mg, Intravenous, Q6H, Esterwood, Amy S, PA-C, 4 mg at 08/01/20 0330 .  oxyCODONE (Oxy IR/ROXICODONE) immediate release tablet 5 mg, 5 mg, Oral, Q8H PRN, Cherene Altes, MD, 5 mg at 07/31/20 1811 .  pantoprazole (PROTONIX) EC tablet 40 mg, 40 mg, Oral, BID, Wynetta Fines T, MD, 40 mg at 07/31/20 2116 .  polyethylene glycol (MIRALAX / GLYCOLAX) packet 17 g, 17 g, Oral, Daily PRN, Wynetta Fines T, MD .  vitamin B-12 (CYANOCOBALAMIN) tablet 200 mcg, 200 mcg, Oral, Daily, Wynetta Fines T, MD, 200 mcg at 07/31/20 [phone]  . sodium chloride 60 mL/hr at 08/01/20 0426     Vital signs in last 24 hrs: Vitals:   07/31/20 2040 08/01/20 0610  BP: 137/73 (!) 157/81  Pulse: 77 87  Resp: 16 15  Temp: 97.9 F (36.6 C) 98.1 F (36.7 C)  SpO2: 96% 94%    Intake/Output Summary (Last 24 hours) at 08/01/2020 0841 Last data filed at 08/01/2020 0426 Gross per 24 hour  Intake 2378.35 ml  Output 1100 ml  Net 1278.35 ml     Physical Exam Laying in bed, depressed affect  HEENT: sclera anicteric, oral mucosa without lesions  Neck: supple, no thyromegaly, JVD or lymphadenopathy  Cardiac: RRR without murmurs, S1S2 heard, no peripheral edema  Pulm: clear to auscultation bilaterally, normal RR and effort noted  Abdomen: soft, left inguinal tenderness, with  active bowel sounds. No guarding or palpable hepatosplenomegaly  Skin; warm and dry, no jaundice Pain was not worse with left leg flexion and internal or external rotation. Recent Labs:  CBC Latest Ref Rng & Units 08/01/2020 07/31/2020 07/30/2020  WBC 4.0 - 10.5 K/uL 7.3 8.4 8.5  Hemoglobin 12.0 - 15.0 g/dL 10.5(L) 10.4(L) 11.6(L)  Hematocrit 36.0 - 46.0 % 31.6(L) 30.9(L) 34.7(L)  Platelets 150 - 400 K/uL 265 235 274    No results for input(s): INR in the last 168 hours. CMP Latest Ref Rng & Units 08/01/2020 07/31/2020 07/30/2020  Glucose 70 - 99 mg/dL 96 107(H) 152(H)  BUN 8 - 23 mg/dL <5(L) 9 7(L)  Creatinine 0.44 - 1.00 mg/dL 0.92 0.95 0.95  Sodium 135 - 145 mmol/L 137 137 137  Potassium 3.5 - 5.1 mmol/L 3.7 3.3(L) 3.3(L)  Chloride 98 - 111 mmol/L 108 106 107  CO2 22 - 32 mmol/L 23 25 20(L)  Calcium 8.9 - 10.3 mg/dL 8.5(L) 8.3(L) 8.5(L)  Total Protein 6.5 - 8.1 g/dL 5.7(L) - -  Total Bilirubin 0.3 - 1.2 mg/dL 0.4 - -  Alkaline Phos 38 - 126 U/L 73 - -  AST 15 - 41 U/L 18 - -  ALT 0 - 44 U/L 13 - -     Assessment & Plan  Assessment: Left lower quadrant pain.  It has been suggested this may be functional abdominal pain, and I do think she has an underlying functional bowel disorder.  However, her description of this pain and exam suggest that it may not be digestive at all.  It may be some musculoskeletal pain. Further imaging of that may be warranted.  Not clear why it seems worse with urination at times, she no longer seems to have a UTI.  She definitely has depression, not yet clear to what extent that is contributing to all resulting from chronic pain.  Her nausea and poor appetite are probably some diabetic gastropathy with high hemoglobin A1c, and the nausea might also be made worse by chronic pain.   Plan: Continue standing dose Reglan for now. I will add liquid protein calorie supplements because she already has at least moderate malnutrition with albumin of 2.7, and  further decline into malnutrition will certainly hamper her recovery.  Please see above and consider further imaging of the hip, and also physical therapy evaluation is warranted.  This might help determine if there is some musculoskeletal cause of the pain, and also improve her mobility in hopes of getting her back to independent function.  I believe endoscopic testing would be of low yield.   26 minutes were spent on this encounter (including chart review, history/exam, counseling/coordination of care, and documentation) > 50% of that time was spent on counseling and coordination of care.    Nelida Meuse III Office: [phone]

## 2020-08-02 ENCOUNTER — Inpatient Hospital Stay (HOSPITAL_COMMUNITY): Payer: Medicare Other

## 2020-08-02 DIAGNOSIS — E785 Hyperlipidemia, unspecified: Secondary | ICD-10-CM

## 2020-08-02 DIAGNOSIS — E1169 Type 2 diabetes mellitus with other specified complication: Secondary | ICD-10-CM | POA: Diagnosis not present

## 2020-08-02 DIAGNOSIS — I10 Essential (primary) hypertension: Secondary | ICD-10-CM

## 2020-08-02 DIAGNOSIS — I5032 Chronic diastolic (congestive) heart failure: Secondary | ICD-10-CM | POA: Diagnosis not present

## 2020-08-02 DIAGNOSIS — E1149 Type 2 diabetes mellitus with other diabetic neurological complication: Secondary | ICD-10-CM

## 2020-08-02 DIAGNOSIS — R109 Unspecified abdominal pain: Secondary | ICD-10-CM | POA: Diagnosis not present

## 2020-08-02 DIAGNOSIS — E1165 Type 2 diabetes mellitus with hyperglycemia: Secondary | ICD-10-CM

## 2020-08-02 LAB — GLUCOSE, CAPILLARY
Glucose-Capillary: 112 mg/dL — ABNORMAL HIGH (ref 70–99)
Glucose-Capillary: 164 mg/dL — ABNORMAL HIGH (ref 70–99)
Glucose-Capillary: 184 mg/dL — ABNORMAL HIGH (ref 70–99)
Glucose-Capillary: 99 mg/dL (ref 70–99)

## 2020-08-02 MED ORDER — ONDANSETRON HCL 4 MG PO TABS: 4.0000 mg | ORAL_TABLET | Freq: Four times a day (QID) | ORAL | Status: DC | PRN

## 2020-08-02 MED ORDER — ONDANSETRON 4 MG PO TBDP
4.0000 mg | ORAL_TABLET | Freq: Four times a day (QID) | ORAL | Status: DC | PRN
  Administered 2020-08-04: 4 mg via ORAL
  Filled 2020-08-02: qty 1

## 2020-08-02 MED ORDER — DOXYCYCLINE HYCLATE 100 MG PO TABS
100.0000 mg | ORAL_TABLET | Freq: Two times a day (BID) | ORAL | Status: DC
  Administered 2020-08-02 – 2020-08-05 (×6): 100 mg via ORAL
  Filled 2020-08-02 (×6): qty 1

## 2020-08-02 NOTE — Progress Notes (Addendum)
PROGRESS NOTE    Maria Fox  SEG:[id number] DOB: 11-15-1945 DOA: 07/29/2020 PCP: [name], MD   Brief Narrative: Maria Fox is a 75 y.o. Female with history of diabetes mellitus type 2, diabetic neuropathy, hypertension, hyperlipidemia, CAD status post CABG, paroxysmal atrial fibrillation on Eliquis, CKD stage II, chronic diastolic heart failure.  Patient presented secondary to intractable nausea and vomiting with associated abdominal pain.  Patient has had similar presentation in the past with unremarkable work-up.  Concern for possible gastroparesis.  Patient started on Reglan IV.   Assessment & Plan:   Principal Problem:   Functional abdominal pain syndrome Active Problems:   MDD (major depressive disorder), recurrent severe, without psychosis ([place])   Hypertension, essential   Hyperlipidemia associated with type 2 diabetes mellitus ([place])   DM (diabetes mellitus), type 2, uncontrolled w/neurologic complication ([name])   Chronic diastolic CHF (congestive heart failure) ([name])   Intractable vomiting   Intractable nausea and vomiting   Abdominal pain   Intractable nausea and vomiting Possibly related to undiagnosed gastroparesis.  Patient was started on scheduled Reglan and Zofran with improvement.  GI was consulted this admission with recommendations for no endoscopic evaluation at this time. -Continue Reglan and transition to Zofran as needed -Continue to soft diet  Abdominal pain CT scan on admission without any acute disease to explain abdominal pain.  Pain appears to be more abdominal.  Buttock abscess Initially drained in the ED. Currently indurated and erythematous. Surgery consulted for bedside I&D. Patient with penicillin allergy that includes anyphylaxis -Start doxycycline 100 mg BID  Hypokalemia Hypomagnesemia Secondary to GI losses and poor oral intake. Supplementation as needed.  Elevated creatinine CKD stage II Patient does not meet criteria for  AKI on admission.  Baseline creatinine of 1 with a creatinine of 1.16 on admission.  Currently stable.  Primary hypertension -Continue Imdur 50 mg daily and Toprol-XL 25 mg daily  Diabetes mellitus, type 2 Uncontrolled with hyperglycemia. Patient is on Farxiga, Lantus, glipizide as an outpatient. Last hemoglobin A1C of 11.6% on 07/03/20.  Paroxysmal atrial fibrillation Patient is on Toprol-XL and Eliquis as an outpatient.  Currently rate controlled. -Continue Toprol-XL 25 mg daily and Eliquis 5 mg twice daily  CAD status post CABG Currently asymptomatic. -Continue Plavix and Toprol-XL   DVT prophylaxis: Eliquis Code Status:   Code Status: Full Code Family Communication: None at bedside Disposition Plan: Discharge to home once tolerating diet better likely in 2-3 days   Consultants:   Gastroenterology  General surgery  Procedures:   None  Antimicrobials:  Doxycycline    Subjective: No emesis but she has nausea. No other concerns today.  Objective: Vitals:   08/01/20 0610 08/01/20 1545 08/01/20 2103 08/02/20 0435  BP: (!) 157/81 (!) 153/66 (!) 166/75 (!) 147/68  Pulse: 87 88 74 77  Resp: 15 20 16 15   Temp: 98.1 F (36.7 C) 97.9 F (36.6 C) 98 F (36.7 C) 98.7 F (37.1 C)  TempSrc: Oral Oral Oral Oral  SpO2: 94% 92% 95% 94%  Weight:      Height:        Intake/Output Summary (Last 24 hours) at 08/02/2020 1053 Last data filed at 08/02/2020 0600 Gross per 24 hour  Intake 1393.97 ml  Output 1900 ml  Net -506.03 ml   Filed Weights   07/29/20 1133 07/29/20 2229  Weight: 58.1 kg 63.6 kg    Examination:  General exam: Appears calm and comfortable and in no acute distress. Conversant Respiratory: Clear to  auscultation. Respiratory effort normal with no intercostal retractions or use of accessory muscles Cardiovascular: S1 & S2 heard, RRR. No murmurs, rubs, gallops or clicks. No edema Gastrointestinal: Abdomen is nondistended, soft. Tenderness in LLQ/pelvis  with no masses palpated. Normal bowel sounds. Genitourinary: left buttock mass that is tender and indurated with surrounding erythema Neurologic: No focal neurological deficits Musculoskeletal: No calf tenderness Skin: No cyanosis. No new rashes Psychiatry: Alert and oriented. Memory intact. Mood & affect appropriate   Data Reviewed: I have personally reviewed following labs and imaging studies  CBC Lab Results  Component Value Date   WBC 7.3 08/01/2020   RBC 3.54 (L) 08/01/2020   HGB 10.5 (L) 08/01/2020   HCT 31.6 (L) 08/01/2020   MCV 89.3 08/01/2020   MCH 29.7 08/01/2020   PLT 265 08/01/2020   MCHC 33.2 08/01/2020   RDW 13.8 08/01/2020   LYMPHSABS 1.1 07/29/2020   MONOABS 0.7 07/29/2020   EOSABS 0.0 07/29/2020   BASOSABS 0.0 59/56/3875     Last metabolic panel Lab Results  Component Value Date   NA 137 08/01/2020   K 3.7 08/01/2020   CL 108 08/01/2020   CO2 23 08/01/2020   BUN <5 (L) 08/01/2020   CREATININE 0.92 08/01/2020   GLUCOSE 96 08/01/2020   GFRNONAA >60 08/01/2020   GFRAA 41 (L) 01/28/2020   CALCIUM 8.5 (L) 08/01/2020   PHOS 4.4 07/24/2020   PROT 5.7 (L) 08/01/2020   ALBUMIN 2.7 (L) 08/01/2020   BILITOT 0.4 08/01/2020   ALKPHOS 73 08/01/2020   AST 18 08/01/2020   ALT 13 08/01/2020   ANIONGAP 6 08/01/2020    CBG (last 3)  Recent Labs    08/01/20 1713 08/01/20 2104 08/02/20 0853  GLUCAP 186* 114* 112*     GFR: Estimated Creatinine Clearance: 46 mL/min (by C-G formula based on SCr of 0.92 mg/dL).  Coagulation Profile: No results for input(s): INR, PROTIME in the last 168 hours.  Recent Results (from the past 240 hour(s))  Urine culture     Status: None   Collection Time: 07/29/20 11:39 AM   Specimen: Urine, Random  Result Value Ref Range Status   Specimen Description URINE, RANDOM  Final   Special Requests NONE  Final   Culture   Final    NO GROWTH Performed at [facility], [address] [place], [place] [address]    Report  Status 07/30/2020 FINAL  Final  SARS CORONAVIRUS 2 (TAT 6-24 HRS) Nasopharyngeal Nasopharyngeal Swab     Status: None   Collection Time: 07/29/20  9:50 PM   Specimen: Nasopharyngeal Swab  Result Value Ref Range Status   SARS Coronavirus 2 NEGATIVE NEGATIVE Final    Comment: (NOTE) SARS-CoV-2 target nucleic acids are NOT DETECTED.  The SARS-CoV-2 RNA is generally detectable in upper and lower respiratory specimens during the acute phase of infection. Negative results do not preclude SARS-CoV-2 infection, do not rule out co-infections with other pathogens, and should not be used as the sole basis for treatment or other patient management decisions. Negative results must be combined with clinical observations, patient history, and epidemiological information. The expected result is Negative.  Fact Sheet for Patients: SugarRoll.be  Fact Sheet for Healthcare Providers: https://www.woods-mathews.com/  This test is not yet approved or cleared by the [place] FDA and  has been authorized for detection and/or diagnosis of SARS-CoV-2 by FDA under an Emergency Use Authorization (EUA). This EUA will remain  in effect (meaning this test can be used) for  the duration of the COVID-19 declaration under Se ction 564(b)(1) of the Act, 21 U.S.C. section 360bbb-3(b)(1), unless the authorization is terminated or revoked sooner.  Performed at [facility], [place] [address], [place], [place] [address]         Radiology Studies: No results found.      Scheduled Meds: . apixaban  5 mg Oral BID  . clopidogrel  75 mg Oral Daily  . DULoxetine  20 mg Oral Daily  . feeding supplement (GLUCERNA SHAKE)  237 mL Oral TID BM  . gabapentin  300 mg Oral BID  . insulin aspart  0-15 Units Subcutaneous TID WC  . insulin glargine  14 Units Subcutaneous Daily  . isosorbide mononitrate  15 mg Oral Daily  . latanoprost  1 drop Both Eyes QHS  .  metoCLOPramide (REGLAN) injection  10 mg Intravenous Q6H  . metoprolol succinate  25 mg Oral Daily  . nystatin  5 mL Oral QID  . ondansetron  4 mg Intravenous Q6H  . pantoprazole  40 mg Oral BID  . vitamin B-12  200 mcg Oral Daily   Continuous Infusions:   LOS: 2 days     Cordelia Poche, MD [facility] 08/02/2020, 10:53 AM  If 7PM-7AM, please contact night-coverage [email or web address]

## 2020-08-02 NOTE — Evaluation (Signed)
Occupational Therapy Evaluation and Discharge Patient Details Name: Maria Fox MRN: [id number] DOB: 1946/01/30 Today's Date: 08/02/2020    History of Present Illness 75yo Female admitted to the ED on 5/21 due to abdominal pain and nausea/vomitting that never improved from prior hospitalization. Prior medical history of DM2, diabetic neuropathy, HTN, HLD, CAD status post CABG, paroxysmal atrial fibrillation on chronic Eliquis, CKD stage II, and chronic diastolic CHF. Pt was hospitalized 5/2 >07/27/2020 with Klebsiella pneumonia a acute cystitis and intractable nausea and vomiting.   Clinical Impression   Pt admitted to ED for concerns listed above. PTA pt reported that she was independent with all ADL's and IADL's as needed. Pt uses a cane PRN for mobility, lives in a house with her partner, who is available 24/7 to assist as needed. At the time of the evaluation, pt demonstrated continued independence, using no DME with mobility, and completing all ADL's safely despite pain level. Pt does not need OT services at this time and acute OT will sign off. Thank you for the referral.     Follow Up Recommendations  No OT follow up    Equipment Recommendations  None recommended by OT    Recommendations for Other Services       Precautions / Restrictions Precautions Precautions: Fall Restrictions Weight Bearing Restrictions: No      Mobility Bed Mobility Overal bed mobility: Modified Independent             General bed mobility comments: Bed flat, pt needed no assist getting into or out of bed, just increased time due to pain.    Transfers Overall transfer level: Modified independent Equipment used: None Transfers: Sit to/from Stand Sit to Stand: Modified independent (Device/Increase time)         General transfer comment: Pt uses grab bars to stand when they are available to her.    Balance Overall balance assessment: Modified Independent Sitting-balance support: Feet  supported Sitting balance-Leahy Scale: Good     Standing balance support: Single extremity supported;No upper extremity supported;During functional activity Standing balance-Leahy Scale: Good Standing balance comment: Pt can complete dynamic standing at sink for grooming, when ambulating pt does best with a cane.                           ADL either performed or assessed with clinical judgement   ADL Overall ADL's : Modified independent;At baseline Eating/Feeding: Independent;Sitting   Grooming: Wash/dry hands;Wash/dry face;Modified independent;Standing   Upper Body Bathing: Modified independent;Sitting   Lower Body Bathing: Modified independent;Sit to/from stand;Sitting/lateral leans   Upper Body Dressing : Independent;Sitting   Lower Body Dressing: Modified independent;Sit to/from stand;Sitting/lateral leans   Toilet Transfer: Modified Independent;Ambulation;Comfort height toilet   Toileting- Clothing Manipulation and Hygiene: Modified independent;Sitting/lateral lean;Sit to/from stand       Functional mobility during ADLs: Modified independent General ADL Comments: Pt initially used OT's hand for assist with ambulation, but quickly gained balance and was able to complete functional mobility with no physical assist.     Vision Baseline Vision/History: Wears glasses Wears Glasses: Reading only Patient Visual Report: No change from baseline Vision Assessment?: No apparent visual deficits     Perception Perception Perception Tested?: No   Praxis Praxis Praxis tested?: Not tested    Pertinent Vitals/Pain Pain Assessment: 0-10 Pain Score: 8  Pain Location: Headache and L side Pain Descriptors / Indicators: Aching;Discomfort;Grimacing;Headache Pain Intervention(s): Limited activity within patient's tolerance;Monitored during session;Repositioned;Premedicated before session  Hand Dominance Left   Extremity/Trunk Assessment Upper Extremity  Assessment Upper Extremity Assessment: Overall WFL for tasks assessed   Lower Extremity Assessment Lower Extremity Assessment: Defer to PT evaluation   Cervical / Trunk Assessment Cervical / Trunk Assessment: Normal   Communication Communication Communication: No difficulties   Cognition Arousal/Alertness: Awake/alert Behavior During Therapy: WFL for tasks assessed/performed Overall Cognitive Status: Within Functional Limits for tasks assessed                                     General Comments  VSS on RA, Wound bandage intact on sacral area    Exercises     Shoulder Instructions      Home Living Family/patient expects to be discharged to:: Private residence Living Arrangements: Spouse/significant other Available Help at Discharge: Family;Friend(s);Available 24 hours/day Type of Home: House Home Access: Level entry     Home Layout: One level     Bathroom Shower/Tub: Teacher, early years/pre: Standard     Home Equipment: Shower seat;Hand held shower head;Bedside commode;Cane - quad;Walker - standard;Grab bars - tub/shower          Prior Functioning/Environment Level of Independence: Independent with assistive device(s)  Gait / Transfers Assistance Needed: uses cane PRN ADL's / Homemaking Assistance Needed: Pt reports completing own BADLs independently and [name] completes most IADLs. Pt reports that they often go out to eat. [name] provides transportation as pt does not drive. Pt receives a little assistance with setup of her meds.            OT Problem List: Decreased strength;Decreased activity tolerance;Decreased coordination;Decreased safety awareness;Pain      OT Treatment/Interventions: Self-care/ADL training;Therapeutic exercise;Energy conservation;DME and/or AE instruction;Therapeutic activities;Patient/family education;Balance training    OT Goals(Current goals can be found in the care plan section) Acute Rehab OT  Goals Patient Stated Goal: To lessen her pain OT Goal Formulation: All assessment and education complete, DC therapy Time For Goal Achievement: 08/02/20 Potential to Achieve Goals: Good  OT Frequency: Min 2X/week   Barriers to D/C:            Co-evaluation              AM-PAC OT "6 Clicks" Daily Activity     Outcome Measure Help from another person eating meals?: None Help from another person taking care of personal grooming?: None Help from another person toileting, which includes using toliet, bedpan, or urinal?: None Help from another person bathing (including washing, rinsing, drying)?: None Help from another person to put on and taking off regular upper body clothing?: None Help from another person to put on and taking off regular lower body clothing?: None 6 Click Score: 24   End of Session Equipment Utilized During Treatment: Gait belt Nurse Communication: Mobility status  Activity Tolerance: Patient tolerated treatment well Patient left: in bed;with call bell/phone within reach  OT Visit Diagnosis: Unsteadiness on feet (R26.81);Muscle weakness (generalized) (M62.81)                Time: 8676-1950 OT Time Calculation (min): 19 min Charges:  OT General Charges $OT Visit: 1 Visit OT Evaluation $OT Eval Low Complexity: [place]., OTR/L Acute Rehabilitation  Deby Adger Elane Serrita Lueth 08/02/2020, 1:12 PM

## 2020-08-02 NOTE — Progress Notes (Signed)
Called by primary attending to evaluate possible R buttock abscess.   Abscess was drained previously by EDP. On evaluation patient with erythema and induration of R buttock with small area that appeared to have some crusted drainage. Abscess cavity probed bluntly with needle driver and reopened for drainage. Recommend iodoform packing today, remove packing in 48 hrs and start sitz baths. No other recommendations from a general surgery standpoint and this patient does not require operative intervention at this time. General surgery will sign off.   Norm Parcel, PA-C [facility] 08/02/2020, 1:16 PM Please see Amion for pager number during day hours 7:00am-4:30pm

## 2020-08-02 NOTE — Evaluation (Signed)
Physical Therapy Evaluation Patient Details Name: Maria Fox MRN: [id number] DOB: [birth year] Today's Date: 08/02/2020   History of Present Illness  75yo Female admitted to the ED on 07/29/20 due to abdominal pain and nausea/vomitting that never improved from prior hospitalization (07/10/20-07/27/20). Pt dx with functional GI disorder, hypokalemia, hypomagnesemia, AKI, draining wound of L buttocks (local wound care surgery signed off).  Prior medical history of DM2, diabetic neuropathy, HTN, CAD status post CABG, paroxysmal atrial fibrillation on chronic Eliquis, CKD stage II, and chronic diastolic CHF, COVID 19.  Clinical Impression  Pt remains near constantly nauseated, but was agreeable for a short walk down the hallway.  Her ongoing illness has made her weak and a bit more imbalanced than normal.  She needed my hand to hold to steady herself during hallway gait.  She has support at home and would benefit from acute therapy to encourage progressive mobility and exercising.  She was too nauseated to participate in more than just a short walk today, but I am hopeful to continue to come by to get her going more.   PT to follow acutely for deficits listed below.     Follow Up Recommendations No PT follow up;Supervision - Intermittent    Equipment Recommendations  None recommended by PT (pt reports she has a RW at home)    Recommendations for Other Services       Precautions / Restrictions Precautions Precautions: Fall Precaution Comments: mildly unsteady on her feet. Restrictions Weight Bearing Restrictions: No      Mobility  Bed Mobility Overal bed mobility: Modified Independent             General bed mobility comments: Bed flat, pt needed no assist getting into or out of bed, just increased time due to pain.    Transfers Overall transfer level: Needs assistance Equipment used: 1 person hand held assist Transfers: Sit to/from Stand Sit to Stand: Min guard         General  transfer comment: Min guard assist for safety and balance.  Ambulation/Gait Ambulation/Gait assistance: Min assist Gait Distance (Feet): 65 Feet Assistive device: 1 person hand held assist Gait Pattern/deviations: Step-through pattern;Shuffle;Staggering left;Staggering right Gait velocity: decreased Gait velocity interpretation: 1.31 - 2.62 ft/sec, indicative of limited community ambulator General Gait Details: Pt with mild sway in her balance during short distance gait in hallway.  She wanted the support of one hand from PT.  She is generally weak from prolonged illness making her nauseated and not eating much.  Stairs            Wheelchair Mobility    Modified Rankin (Stroke Patients Only)       Balance Overall balance assessment: Needs assistance Sitting-balance support: Feet supported;No upper extremity supported Sitting balance-Leahy Scale: Good     Standing balance support: No upper extremity supported Standing balance-Leahy Scale: Fair Standing balance comment: close supervision, one hand normally supported when challenged.                             Pertinent Vitals/Pain Pain Assessment: 0-10 Pain Score: 8  Pain Location: Headache and L side Pain Descriptors / Indicators: Aching;Discomfort;Grimacing;Headache Pain Intervention(s): Limited activity within patient's tolerance;Monitored during session;Repositioned    Home Living Family/patient expects to be discharged to:: Private residence Living Arrangements: Spouse/significant other Available Help at Discharge: Family;Friend(s);Available 24 hours/day Type of Home: House Home Access: Level entry     Home Layout: One level Home  Equipment: Shower seat;Hand held shower head;Bedside commode;Cane - quad;Walker - standard;Grab bars - tub/shower      Prior Function Level of Independence: Independent with assistive device(s)   Gait / Transfers Assistance Needed: uses cane PRN  ADL's / Homemaking  Assistance Needed: Pt reports completing own BADLs independently and Maria Fox completes most IADLs. Pt reports that they often go out to eat. Maria Fox provides transportation as pt does not drive. Pt receives a little assistance with setup of her meds.        Hand Dominance   Dominant Hand: Left    Extremity/Trunk Assessment   Upper Extremity Assessment Upper Extremity Assessment: Defer to OT evaluation    Lower Extremity Assessment Lower Extremity Assessment: Generalized weakness    Cervical / Trunk Assessment Cervical / Trunk Assessment: Normal  Communication   Communication: No difficulties  Cognition Arousal/Alertness: Awake/alert Behavior During Therapy: WFL for tasks assessed/performed Overall Cognitive Status: Within Functional Limits for tasks assessed                                        General Comments General comments (skin integrity, edema, etc.): VSS on RA, Wound bandage intact on sacral area    Exercises     Assessment/Plan    PT Assessment Patient needs continued PT services  PT Problem List Decreased strength;Decreased activity tolerance;Decreased balance;Decreased mobility;Decreased knowledge of use of DME;Pain       PT Treatment Interventions DME instruction;Gait training;Stair training;Functional mobility training;Therapeutic activities;Therapeutic exercise;Balance training;Patient/family education    PT Goals (Current goals can be found in the Care Plan section)  Acute Rehab PT Goals Patient Stated Goal: to get rid of the nausea PT Goal Formulation: With patient Time For Goal Achievement: 08/16/20 Potential to Achieve Goals: Good    Frequency Min 3X/week   Barriers to discharge        Co-evaluation               AM-PAC PT "6 Clicks" Mobility  Outcome Measure Help needed turning from your back to your side while in a flat bed without using bedrails?: None Help needed moving from lying on your back to sitting on the  side of a flat bed without using bedrails?: None Help needed moving to and from a bed to a chair (including a wheelchair)?: A Little Help needed standing up from a chair using your arms (e.g., wheelchair or bedside chair)?: A Little Help needed to walk in hospital room?: A Little Help needed climbing 3-5 steps with a railing? : A Little 6 Click Score: 20    End of Session   Activity Tolerance: Patient limited by fatigue;Other (comment) (limited by near constant nausea) Patient left: in bed;with call bell/phone within reach   PT Visit Diagnosis: Muscle weakness (generalized) (M62.81);Difficulty in walking, not elsewhere classified (R26.2);Unsteadiness on feet (R26.81)    Time: 5009-3818 PT Time Calculation (min) (ACUTE ONLY): 8 min   Charges:   PT Evaluation $PT Eval Moderate Complexity: [place], PT, DPT  Acute Rehabilitation [phone] pager [phone]) [phone] office

## 2020-08-03 DIAGNOSIS — I5032 Chronic diastolic (congestive) heart failure: Secondary | ICD-10-CM | POA: Diagnosis not present

## 2020-08-03 DIAGNOSIS — E1169 Type 2 diabetes mellitus with other specified complication: Secondary | ICD-10-CM | POA: Diagnosis not present

## 2020-08-03 DIAGNOSIS — E1149 Type 2 diabetes mellitus with other diabetic neurological complication: Secondary | ICD-10-CM | POA: Diagnosis not present

## 2020-08-03 DIAGNOSIS — R109 Unspecified abdominal pain: Secondary | ICD-10-CM | POA: Diagnosis not present

## 2020-08-03 LAB — GLUCOSE, CAPILLARY
Glucose-Capillary: 110 mg/dL — ABNORMAL HIGH (ref 70–99)
Glucose-Capillary: 158 mg/dL — ABNORMAL HIGH (ref 70–99)
Glucose-Capillary: 149 mg/dL — ABNORMAL HIGH (ref 70–99)
Glucose-Capillary: 103 mg/dL — ABNORMAL HIGH (ref 70–99)

## 2020-08-03 MED ORDER — ADULT MULTIVITAMIN W/MINERALS CH
1.0000 | ORAL_TABLET | Freq: Every day | ORAL | Status: DC
  Administered 2020-08-03 – 2020-08-05 (×3): 1 via ORAL
  Filled 2020-08-03 (×3): qty 1

## 2020-08-03 MED ORDER — METOCLOPRAMIDE HCL 5 MG PO TABS
10.0000 mg | ORAL_TABLET | Freq: Three times a day (TID) | ORAL | Status: DC
  Administered 2020-08-03 – 2020-08-05 (×8): 10 mg via ORAL
  Filled 2020-08-03 (×8): qty 2

## 2020-08-03 NOTE — Progress Notes (Signed)
Initial Nutrition Assessment  DOCUMENTATION CODES:   Not applicable  INTERVENTION:   Encourage smaller, more frequent meals  Provided "Gastroparesis Nutrition Therapy" handout from [employer] to patient  Continue Glucerna Shake po TID, each supplement provides 220 kcal and 10 grams of protein  Add MVI with minerals  NUTRITION DIAGNOSIS:   Inadequate oral intake related to nausea,decreased appetite as evidenced by per patient/family report.  GOAL:   Patient will meet greater than or equal to 90% of their needs  MONITOR:   PO intake,Supplement acceptance,Skin,Labs  REASON FOR ASSESSMENT:   Consult Assessment of nutrition requirement/status,Diet education  ASSESSMENT:   75 yo female admitted with recurrent intractable nausea and vomiting and abdominal pain with suspected gastroparesis; PMH includes DM, HTN, HLD, CAD/CABG, CKD II, CHF   Noted pt with similar presentation in the past with unremarkable work-up  On visit today; pt with all the lights off in the room, curtain closed, pt reports she does not feel well; very nauseated  Pt has been unable to eat much today due to nausea. Recorded po intake 0-100%  Weight appears relatively stable per weight encounters.   Pt with poorly controlled DM Lab Results  Component Value Date   HGBA1C 11.6 (H) 07/03/2020   Pt with buttock abscess, drained in ED and reevaluated by surgery today.   Labs: CBGs 99-184 Meds: ss novolog, lantus, reglan, zofran, B-12,    Diet Order:   Diet Order            DIET SOFT Room service appropriate? Yes; Fluid consistency: Thin  Diet effective now                 EDUCATION NEEDS:   Education needs have been addressed  Skin:  Skin Assessment: Skin Integrity Issues: Skin Integrity Issues:: Other (Comment) Other: buttock abscess  Last BM:  5/25  Height:   Ht Readings from Last 1 Encounters:  07/29/20 5\' 3"  (1.6 m)    Weight:   Wt Readings from  Last 1 Encounters:  07/29/20 63.6 kg     BMI:  Body mass index is 24.84 kg/m.  Estimated Nutritional Needs:   Kcal:  1700-1900 kcals  Protein:  85-95 g  Fluid:  >/= 1.8 L   Kerman Passey MS, RDN, LDN, CNSC Registered Dietitian III Clinical Nutrition RD Pager and On-Call Pager Number Located in [place]

## 2020-08-03 NOTE — Progress Notes (Signed)
PROGRESS NOTE    Maria Fox  VOZ:[id number] DOB: December 08, 1945 DOA: 07/29/2020 PCP: [name], MD   Brief Narrative: Maria Fox is a 75 y.o. Female with history of diabetes mellitus type 2, diabetic neuropathy, hypertension, hyperlipidemia, CAD status post CABG, paroxysmal atrial fibrillation on Eliquis, CKD stage II, chronic diastolic heart failure.  Patient presented secondary to intractable nausea and vomiting with associated abdominal pain.  Patient has had similar presentation in the past with unremarkable work-up.  Concern for possible gastroparesis.  Patient started on Reglan IV.   Assessment & Plan:   Principal Problem:   Functional abdominal pain syndrome Active Problems:   MDD (major depressive disorder), recurrent severe, without psychosis ([name])   Hypertension, essential   Hyperlipidemia associated with type 2 diabetes mellitus ([name])   DM (diabetes mellitus), type 2, uncontrolled w/neurologic complication ([name])   Chronic diastolic CHF (congestive heart failure) ([name])   Intractable vomiting   Intractable nausea and vomiting   Abdominal pain   Intractable nausea and vomiting Possibly related to undiagnosed gastroparesis.  Patient was started on scheduled Reglan and Zofran with improvement.  GI was consulted this admission with recommendations for no endoscopic evaluation at this time. -Continue Reglan and Zofran as needed -Continue to soft diet  Abdominal pain CT scan on admission without any acute disease to explain abdominal pain.  Pain appears to be more pelvic. [place] of pelvis was unremarkable. Seems to be most consistent with muscle pain.  Buttock abscess Initially drained in the ED. Currently indurated and erythematous. Surgery consulted for bedside I&D. Patient with penicillin allergy that includes anyphylaxis -Continue doxycycline 100 mg BID with plan for 10 day course  Hypokalemia Hypomagnesemia Secondary to GI losses and poor oral intake.  Supplementation as needed.  Elevated creatinine CKD stage II Patient does not meet criteria for AKI on admission.  Baseline creatinine of 1 with a creatinine of 1.16 on admission.  Currently stable.  Primary hypertension -Continue Imdur 50 mg daily and Toprol-XL 25 mg daily  Diabetes mellitus, type 2 Uncontrolled with hyperglycemia. Patient is on Farxiga, Lantus, glipizide as an outpatient. Last hemoglobin A1C of 11.6% on 07/03/20.  Paroxysmal atrial fibrillation Patient is on Toprol-XL and Eliquis as an outpatient.  Currently rate controlled. -Continue Toprol-XL 25 mg daily and Eliquis 5 mg twice daily  CAD status post CABG Currently asymptomatic. -Continue Plavix and Toprol-XL   DVT prophylaxis: Eliquis Code Status:   Code Status: Full Code Family Communication: None at bedside Disposition Plan: Discharge to home in 24 hours if continues to tolerate oral intake   Consultants:   Gastroenterology  General surgery  Procedures:   None  Antimicrobials:  Doxycycline    Subjective: No emesis. Some nausea. Still with some abdominal pain.  Objective: Vitals:   08/02/20 2033 08/03/20 0511 08/03/20 0934 08/03/20 1408  BP: (!) 155/78 (!) 155/65 133/78 124/73  Pulse: 66 76 90 89  Resp: 18 16 17 17   Temp: 97.6 F (36.4 C) 97.8 F (36.6 C)  (!) 97.4 F (36.3 C)  TempSrc: Oral Oral  Oral  SpO2: 96% 95% 95% 95%  Weight:      Height:        Intake/Output Summary (Last 24 hours) at 08/03/2020 1606 Last data filed at 08/03/2020 1050 Gross per 24 hour  Intake 440 ml  Output 1150 ml  Net -710 ml   Filed Weights   07/29/20 1133 07/29/20 2229  Weight: 58.1 kg 63.6 kg    Examination:  General exam:  Appears calm and comfortable Respiratory system: Clear to auscultation. Respiratory effort normal. Cardiovascular system: S1 & S2 heard, RRR. No murmurs, rubs, gallops or clicks. Gastrointestinal system: Abdomen is nondistended, soft and nontender. No organomegaly or  masses felt. Normal bowel sounds heard. Central nervous system: Alert and oriented. No focal neurological deficits. Musculoskeletal: No edema. No calf tenderness Skin: No cyanosis. No rashes Psychiatry: Judgement and insight appear normal. Mood & affect appropriate.    Data Reviewed: I have personally reviewed following labs and imaging studies  CBC Lab Results  Component Value Date   WBC 7.3 08/01/2020   RBC 3.54 (L) 08/01/2020   HGB 10.5 (L) 08/01/2020   HCT 31.6 (L) 08/01/2020   MCV 89.3 08/01/2020   MCH 29.7 08/01/2020   PLT 265 08/01/2020   MCHC 33.2 08/01/2020   RDW 13.8 08/01/2020   LYMPHSABS 1.1 07/29/2020   MONOABS 0.7 07/29/2020   EOSABS 0.0 07/29/2020   BASOSABS 0.0 93/71/6967     Last metabolic panel Lab Results  Component Value Date   NA 137 08/01/2020   K 3.7 08/01/2020   CL 108 08/01/2020   CO2 23 08/01/2020   BUN <5 (L) 08/01/2020   CREATININE 0.92 08/01/2020   GLUCOSE 96 08/01/2020   GFRNONAA >60 08/01/2020   GFRAA 41 (L) 01/28/2020   CALCIUM 8.5 (L) 08/01/2020   PHOS 4.4 07/24/2020   PROT 5.7 (L) 08/01/2020   ALBUMIN 2.7 (L) 08/01/2020   BILITOT 0.4 08/01/2020   ALKPHOS 73 08/01/2020   AST 18 08/01/2020   ALT 13 08/01/2020   ANIONGAP 6 08/01/2020    CBG (last 3)  Recent Labs    08/02/20 2033 08/03/20 0751 08/03/20 1239  GLUCAP 99 110* 158*     GFR: Estimated Creatinine Clearance: 46 mL/min (by C-G formula based on SCr of 0.92 mg/dL).  Coagulation Profile: No results for input(s): INR, PROTIME in the last 168 hours.  Recent Results (from the past 240 hour(s))  Urine culture     Status: None   Collection Time: 07/29/20 11:39 AM   Specimen: Urine, Random  Result Value Ref Range Status   Specimen Description URINE, RANDOM  Final   Special Requests NONE  Final   Culture   Final    NO GROWTH Performed at [facility], [address] [place], [place] [address]    Report Status 07/30/2020 FINAL  Final  SARS CORONAVIRUS 2  (TAT 6-24 HRS) Nasopharyngeal Nasopharyngeal Swab     Status: None   Collection Time: 07/29/20  9:50 PM   Specimen: Nasopharyngeal Swab  Result Value Ref Range Status   SARS Coronavirus 2 NEGATIVE NEGATIVE Final    Comment: (NOTE) SARS-CoV-2 target nucleic acids are NOT DETECTED.  The SARS-CoV-2 RNA is generally detectable in upper and lower respiratory specimens during the acute phase of infection. Negative results do not preclude SARS-CoV-2 infection, do not rule out co-infections with other pathogens, and should not be used as the sole basis for treatment or other patient management decisions. Negative results must be combined with clinical observations, patient history, and epidemiological information. The expected result is Negative.  Fact Sheet for Patients: SugarRoll.be  Fact Sheet for Healthcare Providers: https://www.woods-mathews.com/  This test is not yet approved or cleared by the [place] FDA and  has been authorized for detection and/or diagnosis of SARS-CoV-2 by FDA under an Emergency Use Authorization (EUA). This EUA will remain  in effect (meaning this test can be used) for the duration of the COVID-19  declaration under Se ction 564(b)(1) of the Act, 21 U.S.C. section 360bbb-3(b)(1), unless the authorization is terminated or revoked sooner.  Performed at [facility], [place] [address], [place], [place] [address]         Radiology Studies: US PELVIS LIMITED (TRANSABDOMINAL ONLY)  Result Date: 08/02/2020 CLINICAL DATA:  Pelvic pain. EXAM: TRANSABDOMINAL ULTRASOUND OF PELVIS TECHNIQUE: Transabdominal ultrasound examination of the pelvis was performed including evaluation of the uterus, ovaries, adnexal regions, and pelvic cul-de-sac. COMPARISON:  Jul 30, 2020. FINDINGS: Uterus Measurements: 6.6 x 2.5 x 3.2 cm = volume: 28 mL. No fibroids or other mass visualized. Endometrium Thickness: 4 mm which is within normal  limits. No focal abnormality visualized. Right ovary Not visualized. Left ovary Not visualized. Other findings:  No adnexal lesions are noted. IMPRESSION: Uterus and endometrium are unremarkable. Ovaries are not visualized. No definite adnexal mass is noted. Electronically Signed   By: Marijo Conception M.D.   On: 08/02/2020 20:11        Scheduled Meds: . apixaban  5 mg Oral BID  . clopidogrel  75 mg Oral Daily  . doxycycline  100 mg Oral Q12H  . DULoxetine  20 mg Oral Daily  . feeding supplement (GLUCERNA SHAKE)  237 mL Oral TID BM  . gabapentin  300 mg Oral BID  . insulin aspart  0-15 Units Subcutaneous TID WC  . insulin glargine  14 Units Subcutaneous Daily  . isosorbide mononitrate  15 mg Oral Daily  . latanoprost  1 drop Both Eyes QHS  . metoCLOPramide (REGLAN) injection  10 mg Intravenous Q6H  . metoprolol succinate  25 mg Oral Daily  . multivitamin with minerals  1 tablet Oral Daily  . nystatin  5 mL Oral QID  . pantoprazole  40 mg Oral BID  . vitamin B-12  200 mcg Oral Daily   Continuous Infusions:   LOS: 3 days     Cordelia Poche, MD [facility] 08/03/2020, 4:06 PM  If 7PM-7AM, please contact night-coverage [email or web address]

## 2020-08-03 NOTE — Plan of Care (Signed)
  Problem: Education: Goal: Knowledge of General Education information will improve Description Including pain rating scale, medication(s)/side effects and non-pharmacologic comfort measures Outcome: Progressing   

## 2020-08-03 NOTE — Telephone Encounter (Signed)
Re-hospitalized 07/29/2020.

## 2020-08-03 NOTE — Progress Notes (Signed)
Physical Therapy Treatment Patient Details Name: Maria Fox MRN: [id number] DOB: 09-10-1945 Today's Date: 08/03/2020    History of Present Illness 75yo Female admitted to the ED on 07/29/20 due to abdominal pain and nausea/vomitting that never improved from prior hospitalization (07/10/20-07/27/20). Pt dx with functional GI disorder, hypokalemia, hypomagnesemia, AKI, draining wound of L buttocks (local wound care surgery signed off).  Prior medical history of DM2, diabetic neuropathy, HTN, CAD status post CABG, paroxysmal atrial fibrillation on chronic Eliquis, CKD stage II, and chronic diastolic CHF, COVID 19.    PT Comments    Pt was able to progress gait further down the hallway today, however, remains unstable needing therapist's hand held assist.  Will try cane next session as this is her usual assistive device at home.  If cane is not enough to compensate balance, she may need to use a RW until she is a bit stronger.  PT will continue to follow acutely for safe mobility progression.   Follow Up Recommendations  No PT follow up;Supervision - Intermittent     Equipment Recommendations  None recommended by PT    Recommendations for Other Services       Precautions / Restrictions Precautions Precautions: Fall Precaution Comments: mildly unsteady on her feet.    Mobility  Bed Mobility Overal bed mobility: Modified Independent                  Transfers Overall transfer level: Needs assistance Equipment used: 1 person hand held assist Transfers: Sit to/from Stand Sit to Stand: Min guard         General transfer comment: Min guard assist for safety and balance  Ambulation/Gait Ambulation/Gait assistance: Min assist Gait Distance (Feet): 100 Feet Assistive device: 1 person hand held assist Gait Pattern/deviations: Step-through pattern;Shuffle;Staggering left;Staggering right Gait velocity: decreased Gait velocity interpretation: 1.31 - 2.62 ft/sec, indicative of  limited community ambulator General Gait Details: Pt continues to have mildly staggering gait pattern, usually uses a cane at baseline, and may require less assist if I bring a cane next session.  If she is still unsteady with a cane, may need to use RW for a short period of time until she feels stronger.   Stairs             Wheelchair Mobility    Modified Rankin (Stroke Patients Only)       Balance   Sitting-balance support: Feet supported;Bilateral upper extremity supported Sitting balance-Leahy Scale: Good     Standing balance support: No upper extremity supported;Single extremity supported Standing balance-Leahy Scale: Fair                              Cognition Arousal/Alertness: Awake/alert Behavior During Therapy: WFL for tasks assessed/performed Overall Cognitive Status: Within Functional Limits for tasks assessed                                        Exercises      General Comments General comments (skin integrity, edema, etc.): Pt preformed her own pericare in sitting.      Pertinent Vitals/Pain Pain Assessment: Faces Faces Pain Scale: Hurts little more Pain Location: abdomen Pain Descriptors / Indicators: Grimacing;Guarding Pain Intervention(s): Limited activity within patient's tolerance;Monitored during session;Repositioned    Home Living  Prior Function            PT Goals (current goals can now be found in the care plan section) Progress towards PT goals: Progressing toward goals    Frequency    Min 3X/week      PT Plan Current plan remains appropriate    Co-evaluation              AM-PAC PT "6 Clicks" Mobility   Outcome Measure  Help needed turning from your back to your side while in a flat bed without using bedrails?: None Help needed moving from lying on your back to sitting on the side of a flat bed without using bedrails?: None Help needed moving to and  from a bed to a chair (including a wheelchair)?: A Little Help needed standing up from a chair using your arms (e.g., wheelchair or bedside chair)?: A Little Help needed to walk in hospital room?: A Little Help needed climbing 3-5 steps with a railing? : A Little 6 Click Score: 20    End of Session Equipment Utilized During Treatment: Gait belt Activity Tolerance: Patient limited by fatigue;Other (comment) (limited by nausea) Patient left: in bed;with call bell/phone within reach   PT Visit Diagnosis: Muscle weakness (generalized) (M62.81);Difficulty in walking, not elsewhere classified (R26.2);Unsteadiness on feet (R26.81)     Time: 1324-1400 PT Time Calculation (min) (ACUTE ONLY): 36 min  Charges:  $Gait Training: 8-22 mins $Therapeutic Activity: 8-22 mins                     Verdene Lennert, PT, DPT  Acute Rehabilitation [phone] pager [phone]) [phone] office

## 2020-08-03 NOTE — Care Management Important Message (Signed)
Important Message  Patient Details  Name: Maria Fox MRN: [id number] Date of Birth: 1945/08/13   Medicare Important Message Given:  Yes     [name] 08/03/2020, 3:07 PM

## 2020-08-04 DIAGNOSIS — E1149 Type 2 diabetes mellitus with other diabetic neurological complication: Secondary | ICD-10-CM | POA: Diagnosis not present

## 2020-08-04 DIAGNOSIS — I5032 Chronic diastolic (congestive) heart failure: Secondary | ICD-10-CM | POA: Diagnosis not present

## 2020-08-04 DIAGNOSIS — R109 Unspecified abdominal pain: Secondary | ICD-10-CM | POA: Diagnosis not present

## 2020-08-04 DIAGNOSIS — E1169 Type 2 diabetes mellitus with other specified complication: Secondary | ICD-10-CM | POA: Diagnosis not present

## 2020-08-04 LAB — GLUCOSE, CAPILLARY
Glucose-Capillary: 128 mg/dL — ABNORMAL HIGH (ref 70–99)
Glucose-Capillary: 116 mg/dL — ABNORMAL HIGH (ref 70–99)
Glucose-Capillary: 181 mg/dL — ABNORMAL HIGH (ref 70–99)
Glucose-Capillary: 183 mg/dL — ABNORMAL HIGH (ref 70–99)

## 2020-08-04 NOTE — Progress Notes (Signed)
PROGRESS NOTE    Maria Fox  JOI:[id number] DOB: October 09, 1945 DOA: 07/29/2020 PCP: [name], MD   Brief Narrative: Maria Fox is a 75 y.o. Female with history of diabetes mellitus type 2, diabetic neuropathy, hypertension, hyperlipidemia, CAD status post CABG, paroxysmal atrial fibrillation on Eliquis, CKD stage II, chronic diastolic heart failure.  Patient presented secondary to intractable nausea and vomiting with associated abdominal pain.  Patient has had similar presentation in the past with unremarkable work-up.  Concern for possible gastroparesis.  Patient started on Reglan IV.   Assessment & Plan:   Principal Problem:   Functional abdominal pain syndrome Active Problems:   MDD (major depressive disorder), recurrent severe, without psychosis ([place])   Hypertension, essential   Hyperlipidemia associated with type 2 diabetes mellitus ([place])   DM (diabetes mellitus), type 2, uncontrolled w/neurologic complication ([name])   Chronic diastolic CHF (congestive heart failure) ([name])   Intractable vomiting   Intractable nausea and vomiting   Abdominal pain   Intractable nausea and vomiting Possibly related to undiagnosed gastroparesis.  Patient was started on scheduled Reglan and Zofran with improvement.  GI was consulted this admission with recommendations for no endoscopic evaluation at this time. -Continue Reglan and Zofran as needed -Continue soft diet  Abdominal pain CT scan on admission without any acute disease to explain abdominal pain.  Pain appears to be more pelvic. [place] of pelvis was unremarkable. Seems to be most consistent with muscle pain.  Buttock abscess Initially drained in the ED. Currently indurated and erythematous. Surgery consulted for bedside I&D. Patient with penicillin allergy that includes anyphylaxis -Continue doxycycline 100 mg BID with plan for 10 day course  Hypokalemia Hypomagnesemia Secondary to GI losses and poor oral intake.  Supplementation as needed.  Elevated creatinine CKD stage II Patient does not meet criteria for AKI on admission.  Baseline creatinine of 1 with a creatinine of 1.16 on admission.  Currently stable.  Primary hypertension -Continue Imdur 50 mg daily and Toprol-XL 25 mg daily  Diabetes mellitus, type 2 Uncontrolled with hyperglycemia. Patient is on Farxiga, Lantus, glipizide as an outpatient. Last hemoglobin A1C of 11.6% on 07/03/20.  Paroxysmal atrial fibrillation Patient is on Toprol-XL and Eliquis as an outpatient.  Currently rate controlled. -Continue Toprol-XL 25 mg daily and Eliquis 5 mg twice daily  CAD status post CABG Currently asymptomatic. -Continue Plavix and Toprol-XL   DVT prophylaxis: Eliquis Code Status:   Code Status: Full Code Family Communication: None at bedside Disposition Plan: Discharge to home in 24 hours if continues to tolerate oral intake   Consultants:   Gastroenterology  General surgery  Procedures:   None  Antimicrobials:  Doxycycline    Subjective: No emesis today. Has not eaten because of nausea. Feels sick. Has not gotten out of bed today. Lights are off and blinds are closed.  Objective: Vitals:   08/03/20 2141 08/04/20 0603 08/04/20 1136 08/04/20 1420  BP: 139/86 128/62 132/64 139/80  Pulse: 76 87 84 72  Resp: 18 19  20   Temp: 97.6 F (36.4 C) 97.6 F (36.4 C)  98.6 F (37 C)  TempSrc: Oral Oral  Oral  SpO2: 96% 93%  96%  Weight:      Height:        Intake/Output Summary (Last 24 hours) at 08/04/2020 1501 Last data filed at 08/04/2020 1305 Gross per 24 hour  Intake 270 ml  Output 850 ml  Net -580 ml   Filed Weights   07/29/20 1133 07/29/20 2229  Weight: 58.1 kg 63.6 kg    Examination:  General exam: Appears calm and comfortable Respiratory system: Clear to auscultation. Respiratory effort normal. Cardiovascular system: S1 & S2 heard, RRR. No murmurs, rubs, gallops or clicks. Gastrointestinal system: Abdomen is  nondistended, soft and nontender. No organomegaly or masses felt. Normal bowel sounds heard. Central nervous system: Alert and oriented. No focal neurological deficits. Musculoskeletal: No edema. No calf tenderness Skin: No cyanosis. No rashes Psychiatry: Judgement and insight appear normal. Mood & affect appropriate.    Data Reviewed: I have personally reviewed following labs and imaging studies  CBC Lab Results  Component Value Date   WBC 7.3 08/01/2020   RBC 3.54 (L) 08/01/2020   HGB 10.5 (L) 08/01/2020   HCT 31.6 (L) 08/01/2020   MCV 89.3 08/01/2020   MCH 29.7 08/01/2020   PLT 265 08/01/2020   MCHC 33.2 08/01/2020   RDW 13.8 08/01/2020   LYMPHSABS 1.1 07/29/2020   MONOABS 0.7 07/29/2020   EOSABS 0.0 07/29/2020   BASOSABS 0.0 15/07/6977     Last metabolic panel Lab Results  Component Value Date   NA 137 08/01/2020   K 3.7 08/01/2020   CL 108 08/01/2020   CO2 23 08/01/2020   BUN <5 (L) 08/01/2020   CREATININE 0.92 08/01/2020   GLUCOSE 96 08/01/2020   GFRNONAA >60 08/01/2020   GFRAA 41 (L) 01/28/2020   CALCIUM 8.5 (L) 08/01/2020   PHOS 4.4 07/24/2020   PROT 5.7 (L) 08/01/2020   ALBUMIN 2.7 (L) 08/01/2020   BILITOT 0.4 08/01/2020   ALKPHOS 73 08/01/2020   AST 18 08/01/2020   ALT 13 08/01/2020   ANIONGAP 6 08/01/2020    CBG (last 3)  Recent Labs    08/03/20 2139 08/04/20 0743 08/04/20 1138  GLUCAP 103* 128* 116*     GFR: Estimated Creatinine Clearance: 46 mL/min (by C-G formula based on SCr of 0.92 mg/dL).  Coagulation Profile: No results for input(s): INR, PROTIME in the last 168 hours.  Recent Results (from the past 240 hour(s))  Urine culture     Status: None   Collection Time: 07/29/20 11:39 AM   Specimen: Urine, Random  Result Value Ref Range Status   Specimen Description URINE, RANDOM  Final   Special Requests NONE  Final   Culture   Final    NO GROWTH Performed at [facility], [address], [place], [place] [address]     Report Status 07/30/2020 FINAL  Final  SARS CORONAVIRUS 2 (TAT 6-24 HRS) Nasopharyngeal Nasopharyngeal Swab     Status: None   Collection Time: 07/29/20  9:50 PM   Specimen: Nasopharyngeal Swab  Result Value Ref Range Status   SARS Coronavirus 2 NEGATIVE NEGATIVE Final    Comment: (NOTE) SARS-CoV-2 target nucleic acids are NOT DETECTED.  The SARS-CoV-2 RNA is generally detectable in upper and lower respiratory specimens during the acute phase of infection. Negative results do not preclude SARS-CoV-2 infection, do not rule out co-infections with other pathogens, and should not be used as the sole basis for treatment or other patient management decisions. Negative results must be combined with clinical observations, patient history, and epidemiological information. The expected result is Negative.  Fact Sheet for Patients: SugarRoll.be  Fact Sheet for Healthcare Providers: https://www.woods-mathews.com/  This test is not yet approved or cleared by the [place] FDA and  has been authorized for detection and/or diagnosis of SARS-CoV-2 by FDA under an Emergency Use Authorization (EUA). This EUA will remain  in effect (  meaning this test can be used) for the duration of the COVID-19 declaration under Se ction 564(b)(1) of the Act, 21 U.S.C. section 360bbb-3(b)(1), unless the authorization is terminated or revoked sooner.  Performed at [facility], [place] [address] [place], [place] [address]         Radiology Studies: US PELVIS LIMITED (TRANSABDOMINAL ONLY)  Result Date: 08/02/2020 CLINICAL DATA:  Pelvic pain. EXAM: TRANSABDOMINAL ULTRASOUND OF PELVIS TECHNIQUE: Transabdominal ultrasound examination of the pelvis was performed including evaluation of the uterus, ovaries, adnexal regions, and pelvic cul-de-sac. COMPARISON:  Jul 30, 2020. FINDINGS: Uterus Measurements: 6.6 x 2.5 x 3.2 cm = volume: 28 mL. No fibroids or other mass  visualized. Endometrium Thickness: 4 mm which is within normal limits. No focal abnormality visualized. Right ovary Not visualized. Left ovary Not visualized. Other findings:  No adnexal lesions are noted. IMPRESSION: Uterus and endometrium are unremarkable. Ovaries are not visualized. No definite adnexal mass is noted. Electronically Signed   By: Marijo Conception M.D.   On: 08/02/2020 20:11        Scheduled Meds: . apixaban  5 mg Oral BID  . clopidogrel  75 mg Oral Daily  . doxycycline  100 mg Oral Q12H  . DULoxetine  20 mg Oral Daily  . feeding supplement (GLUCERNA SHAKE)  237 mL Oral TID BM  . gabapentin  300 mg Oral BID  . insulin aspart  0-15 Units Subcutaneous TID WC  . insulin glargine  14 Units Subcutaneous Daily  . isosorbide mononitrate  15 mg Oral Daily  . latanoprost  1 drop Both Eyes QHS  . metoCLOPramide  10 mg Oral TID AC & HS  . metoprolol succinate  25 mg Oral Daily  . multivitamin with minerals  1 tablet Oral Daily  . pantoprazole  40 mg Oral BID  . vitamin B-12  200 mcg Oral Daily   Continuous Infusions:   LOS: 4 days     Cordelia Poche, MD [facility] 08/04/2020, 3:01 PM  If 7PM-7AM, please contact night-coverage [email or web address]

## 2020-08-04 NOTE — Plan of Care (Signed)
  Problem: Education: Goal: Knowledge of General Education information will improve Description Including pain rating scale, medication(s)/side effects and non-pharmacologic comfort measures Outcome: Progressing   

## 2020-08-05 DIAGNOSIS — R109 Unspecified abdominal pain: Secondary | ICD-10-CM | POA: Diagnosis not present

## 2020-08-05 LAB — GLUCOSE, CAPILLARY
Glucose-Capillary: 138 mg/dL — ABNORMAL HIGH (ref 70–99)
Glucose-Capillary: 128 mg/dL — ABNORMAL HIGH (ref 70–99)

## 2020-08-05 MED ORDER — GLUCERNA SHAKE PO LIQD: 237.0000 mL | Freq: Three times a day (TID) | ORAL | 0 refills | Status: DC

## 2020-08-05 MED ORDER — ONDANSETRON 4 MG PO TBDP: 4.0000 mg | ORAL_TABLET | Freq: Three times a day (TID) | ORAL | 0 refills | Status: DC | PRN

## 2020-08-05 MED ORDER — DOXYCYCLINE HYCLATE 100 MG PO TABS: 100.0000 mg | ORAL_TABLET | Freq: Two times a day (BID) | ORAL | 0 refills | Status: DC

## 2020-08-05 MED ORDER — METOCLOPRAMIDE HCL 10 MG PO TABS: 10.0000 mg | ORAL_TABLET | Freq: Three times a day (TID) | ORAL | 0 refills | Status: DC

## 2020-08-05 MED ORDER — METOPROLOL SUCCINATE ER 25 MG PO TB24: 25.0000 mg | ORAL_TABLET | Freq: Every day | ORAL | 2 refills | Status: DC

## 2020-08-05 MED ORDER — DOXYCYCLINE HYCLATE 100 MG PO TABS: 100.0000 mg | ORAL_TABLET | Freq: Two times a day (BID) | ORAL | 0 refills | Status: AC

## 2020-08-05 NOTE — Discharge Summary (Signed)
Physician Discharge Summary  JAINE ESTABROOKS HYI:[id number] DOB: Jun 16, 1945 DOA: 07/29/2020  PCP: [name], MD  Admit date: 07/29/2020 Discharge date: 08/05/2020  Admitted From: Home Disposition: Home  Recommendations for Outpatient Follow-up:  1. Follow up with PCP in 1 week 2. Follow up with GI in 2-4 weeks 3. Please follow up on the following pending results: None  Home Health: None Equipment/Devices: None  Discharge Condition: Stable CODE STATUS: Full code Diet recommendation: Soft diet   Brief/Interim Summary:  Admission HPI written by Wynetta Fines, MD   HPI: Maria Fox is a 75 y.o. female with medical history significant of IDDM, diabetic neuropathy, HTN, HLD, CAD status post CABG, paroxysmal A. fib on Eliquis, CKD stage II, chronic diastolic CHF, presented with recurrent nauseous vomit abdominal pain and diarrhea.  Recently hospitalized from 05/02-05/19 for Klebsiella UTI and intractable nausea with vomiting.  She was discharged 3 days ago however her symptoms of intractable nausea vomit and left-sided abdominal pain came back right away.  Patient reported she was not able to eat any solid food for last 3 days and can only tolerate it small sips of fluid.  Abdominal pain like before is left-sided located, dull ache, constant through the night.  She has had small small loose bowel movement " pumpkin colored " denies any fever chills no chest pain no shortness of breath no urinary symptoms.   Hospital course:  Intractable nausea and vomiting Possibly related to undiagnosed gastroparesis.  Patient was started on scheduled Reglan and Zofran with improvement.  GI was consulted this admission with recommendations for no endoscopic evaluation at this time. Recommending to take Reglan 4 times daily scheduled. Zofran as needed provided. Patient to follow-up with GI and PCP.  Abdominal pain CT scan on admission without any acute disease to explain abdominal pain.  Pain  appears to be more pelvic. [place] of pelvis was unremarkable. Seems to be most consistent with muscle pain.  Buttock abscess Initially drained in the ED. Currently indurated and erythematous. Surgery consulted for bedside I&D. Patient with penicillin allergy that includes anaphylaxis. Continue doxycycline 100 mg BID with plan for 10 day course  Hypokalemia Hypomagnesemia Secondary to GI losses and poor oral intake. Supplementation as needed  Elevated creatinine CKD stage II Patient does not meet criteria for AKI on admission.  Baseline creatinine of 1 with a creatinine of 1.16 on admission.  Currently stable.  Primary hypertension Continue Imdur 50 mg daily and Toprol-XL 25 mg daily  Diabetes mellitus, type 2 Uncontrolled with hyperglycemia. Patient is on Farxiga, Lantus, glipizide as an outpatient. Last hemoglobin A1C of 11.6% on 07/03/20. Continue on discharge.  Paroxysmal atrial fibrillation Patient is on Toprol-XL and Eliquis as an outpatient.  Currently rate controlled. Continue Toprol-XL 25 mg daily and Eliquis 5 mg twice daily  CAD status post CABG Currently asymptomatic. Continue Plavix and Toprol-XL  Discharge Diagnoses:  Principal Problem:   Functional abdominal pain syndrome Active Problems:   MDD (major depressive disorder), recurrent severe, without psychosis ([place])   Hypertension, essential   Hyperlipidemia associated with type 2 diabetes mellitus ([place])   DM (diabetes mellitus), type 2, uncontrolled w/neurologic complication ([name])   Chronic diastolic CHF (congestive heart failure) ([name])   Intractable vomiting   Intractable nausea and vomiting   Abdominal pain    Discharge Instructions  Discharge Instructions    Increase activity slowly   Complete by: As directed    No wound care   Complete by: As directed  Allergies as of 08/05/2020      Reactions   Bee Venom    Throat swelling   Mushroom Extract Complex Anaphylaxis   Penicillins Anaphylaxis,  Hives, Swelling   Tolerates cephalosporins including cephalexin multiple times.  Has patient had a PCN reaction causing immediate rash, facial/tongue/throat swelling, SOB or lightheadedness with hypotension: Yes Has patient had a PCN reaction causing severe rash involving mucus membranes or skin necrosis: Yes Has patient had a PCN reaction that required hospitalization Yes Has patient had a PCN reaction occurring within the last 10 years: No   Codeine Nausea And Vomiting   Sulfa Antibiotics Nausea And Vomiting   Iohexol Itching, Swelling   Erythromycin Base Rash      Medication List    TAKE these medications   acetaminophen 500 MG tablet Commonly known as: TYLENOL Take 500 mg by mouth as needed for moderate pain.   albuterol 108 (90 Base) MCG/ACT inhaler Commonly known as: ProAir HFA Inhale 2 puffs into the lungs every 6 (six) hours as needed for wheezing or shortness of breath.   apixaban 5 MG Tabs tablet Commonly known as: ELIQUIS Take 1 tablet (5 mg total) by mouth 2 (two) times daily.   atorvastatin 40 MG tablet Commonly known as: LIPITOR TAKE 1 TABLET BY MOUTH EVERYDAY AT BEDTIME What changed:   how much to take  how to take this  when to take this  additional instructions   clopidogrel 75 MG tablet Commonly known as: PLAVIX Take 1 tablet (75 mg total) by mouth daily.   dapagliflozin propanediol 5 MG Tabs tablet Commonly known as: FARXIGA Take 5 mg by mouth daily.   docusate sodium 100 MG capsule Commonly known as: COLACE Take 1 capsule (100 mg total) by mouth daily. What changed:   when to take this  reasons to take this   doxycycline 100 MG tablet Commonly known as: VIBRA-TABS Take 1 tablet (100 mg total) by mouth every 12 (twelve) hours for 7 days.   DULoxetine 20 MG capsule Commonly known as: CYMBALTA Take 20 mg by mouth daily.   feeding supplement (GLUCERNA SHAKE) Liqd Take 237 mLs by mouth 3 (three) times daily between meals.   furosemide  20 MG tablet Commonly known as: LASIX Take 1 tablet (20 mg total) by mouth daily as needed for fluid or edema (leg swelling).   gabapentin 300 MG capsule Commonly known as: NEURONTIN Take 1 capsule (300 mg total) by mouth 2 (two) times daily.   glipiZIDE 5 MG tablet Commonly known as: GLUCOTROL TAKE 1 TABLET (5 MG TOTAL) BY MOUTH 2 (TWO) TIMES DAILY BEFORE A MEAL. What changed:   how much to take  how to take this  when to take this  additional instructions   isosorbide mononitrate 30 MG 24 hr tablet Commonly known as: IMDUR Take 0.5 tablets (15 mg total) by mouth daily.   Lantus SoloStar 100 UNIT/ML Solostar Pen Generic drug: insulin glargine Inject 10 Units into the skin daily.   latanoprost 0.005 % ophthalmic solution Commonly known as: XALATAN Place 1 drop into both eyes at bedtime.   metoCLOPramide 10 MG tablet Commonly known as: REGLAN Take 1 tablet (10 mg total) by mouth 4 (four) times daily -  before meals and at bedtime. What changed:   when to take this  reasons to take this   metoprolol succinate 25 MG 24 hr tablet Commonly known as: Toprol XL Take 1 tablet (25 mg total) by mouth daily. What changed: how much  to take   nitroGLYCERIN 0.4 MG SL tablet Commonly known as: NITROSTAT Place 1 tablet (0.4 mg total) under the tongue every 5 (five) minutes x 3 doses as needed for chest pain.   ondansetron 4 MG disintegrating tablet Commonly known as: ZOFRAN-ODT Take 1 tablet (4 mg total) by mouth every 8 (eight) hours as needed for nausea or vomiting.   ONE TOUCH ULTRA 2 w/Device Kit Use as instructed to check blood sugar 2 times daily as needed.   OneTouch Delica Lancets 41O Misc Use as directed to check blood sugar 2 times daily as needed   OneTouch Ultra test strip Generic drug: glucose blood Use as instructed to check blood sugar 2 times daily as needed   oxyCODONE 5 MG immediate release tablet Commonly known as: Oxy IR/ROXICODONE Take 1 tablet  (5 mg total) by mouth every 6 (six) hours as needed for severe pain.   pantoprazole 40 MG tablet Commonly known as: PROTONIX Take 1 tablet (40 mg total) by mouth 2 (two) times daily.   polyethylene glycol 17 g packet Commonly known as: MIRALAX / GLYCOLAX Take 17 g by mouth daily as needed for moderate constipation.   Vitamin B12 500 MCG Tabs Take 2 tablets by mouth daily.       Follow-up Information    [name], MD. Schedule an appointment as soon as possible for a visit in 1 week(s).   Specialty: Family Medicine Why: Hospital follow-up Contact information: [place] [address] [phone]        Doran Stabler, MD. Schedule an appointment as soon as possible for a visit in 4 week(s).   Specialty: Gastroenterology Contact information: [address] [place] [address] [phone]              Allergies  Allergen Reactions  . Bee Venom     Throat swelling  . Mushroom Extract Complex Anaphylaxis  . Penicillins Anaphylaxis, Hives and Swelling    Tolerates cephalosporins including cephalexin multiple times.  Has patient had a PCN reaction causing immediate rash, facial/tongue/throat swelling, SOB or lightheadedness with hypotension: Yes Has patient had a PCN reaction causing severe rash involving mucus membranes or skin necrosis: Yes Has patient had a PCN reaction that required hospitalization Yes Has patient had a PCN reaction occurring within the last 10 years: No    . Codeine Nausea And Vomiting  . Sulfa Antibiotics Nausea And Vomiting  . Iohexol Itching and Swelling  . Erythromycin Base Rash    Consultations:  [facility]   Procedures/Studies: CT ABDOMEN PELVIS WO CONTRAST  Result Date: 07/30/2020 CLINICAL DATA:  Left lower quadrant pain. EXAM: CT ABDOMEN AND PELVIS WITHOUT CONTRAST TECHNIQUE: Multidetector CT imaging of the abdomen and pelvis was performed following the standard protocol without IV  contrast. COMPARISON:  07/23/2020 FINDINGS: Lower chest: No acute findings. Left pleural effusion resolved and residual left pleural thickening is noted. Soft tissue thickening along the left heart border again noted which may be due to pleural or pericardial thickening. Hepatobiliary: No mass visualized on this unenhanced exam. Prior cholecystectomy. No evidence of biliary obstruction. Pancreas: No mass or inflammatory process visualized on this unenhanced exam. Spleen:  Within normal limits in size. Adrenals/Urinary tract: No evidence of urolithiasis or hydronephrosis. Unremarkable unopacified urinary bladder. Stomach/Bowel: No evidence of obstruction, inflammatory process, or abnormal fluid collections. Normal appendix visualized. Mild diverticulosis is seen mainly involving the descending and sigmoid colon, however there is no evidence of  diverticulitis. Vascular/Lymphatic: No pathologically enlarged lymph nodes identified. No evidence of abdominal aortic aneurysm. Aortic atherosclerotic calcification noted. Reproductive:  No mass or other significant abnormality. Other:  None. Musculoskeletal:  No suspicious bone lesions identified. IMPRESSION: No acute findings. Colonic diverticulosis, without radiographic evidence of diverticulitis. Aortic Atherosclerosis (ICD10-I70.0). Electronically Signed   By: Marlaine Hind M.D.   On: 07/30/2020 17:48   CT ABDOMEN PELVIS WO CONTRAST  Result Date: 07/24/2020 CLINICAL DATA:  Left lower quadrant abdominal pain with nausea and vomiting EXAM: CT ABDOMEN AND PELVIS WITHOUT CONTRAST TECHNIQUE: Multidetector CT imaging of the abdomen and pelvis was performed following the standard protocol without IV contrast. COMPARISON:  07/10/2020 FINDINGS: Lower chest: Small bilateral pleural effusions observed with passive atelectasis. Mild cardiomegaly is present. Abnormal posterior pericardial thickening is observed with some high density nodular components and components adjacent to  the obtuse marginal coronary artery, these have been present since 2016 although particularly the component adjacent to the obtuse marginal coronary artery shows increased thickness over the last year, measuring 1.6 cm in thickness today and measuring only about 0.8 cm in thickness on 07/07/2019. Coronary and descending thoracic aortic atherosclerosis. Small type 1 hiatal hernia. Hepatobiliary: Punctate calcifications compatible with old granulomatous disease. Cholecystectomy. Pancreas: Unremarkable Spleen: Scattered calcifications compatible with old granulomatous disease. Adrenals/Urinary Tract: Unremarkable Stomach/Bowel: Descending duodenal diverticulum. Normal appendix. Scattered descending and sigmoid colon diverticula. Mild prominence of rectal gas and stool. Vascular/Lymphatic: Aortoiliac atherosclerotic vascular disease. Reproductive: Unremarkable Other: Low-level nonspecific presacral edema. Musculoskeletal: Lumbar spondylosis and degenerative disc disease. IMPRESSION: 1. Chronic pericardial thickening posteriorly with a high density component superficial to the circumflex coronary artery, and increased thickening adjacent to the obtuse marginal coronary artery compared to prior exams for example from 2019, and mildly increased from 03/27/2020. Cannot completely exclude a saphenous vein graft aneurysm adjacent to the obtuse marginal coronary artery although this chronic progressive thickening could alternatively be from localized chronic fibrosing pericarditis. Today's CT was performed without IV contrast and is not a dedicated coronary artery CT. The patient had a fairly recent echocardiogram on 07/02/2020 reportedly showing an EF of 60-65%. I would recommend making the patient's cardiologist aware of the current exam findings along the pericardium/coronary arteries in case further workup is warranted. 2. Mild cardiomegaly. Coronary atherosclerosis. Aortic Atherosclerosis (ICD10-I70.0). 3. Old  granulomatous disease. 4. Scattered sigmoid colon and descending colon diverticula. Mild prominence of rectal gas and stool. 5. Lumbar spondylosis and degenerative disc disease. Electronically Signed   By: Van Clines M.D.   On: 07/24/2020 08:14   CT ABDOMEN PELVIS WO CONTRAST  Result Date: 07/10/2020 CLINICAL DATA:  Acute abdominal pain EXAM: CT ABDOMEN AND PELVIS WITHOUT CONTRAST TECHNIQUE: Multidetector CT imaging of the abdomen and pelvis was performed following the standard protocol without IV contrast. COMPARISON:  03/27/2020 FINDINGS: Lower chest: No acute abnormality. Chronic pericardial thickening is noted. Hepatobiliary: No focal liver abnormality is seen. Status post cholecystectomy. No biliary dilatation. Pancreas: Unremarkable. No pancreatic ductal dilatation or surrounding inflammatory changes. Spleen: Multiple calcified granulomas are noted Adrenals/Urinary Tract: Adrenal glands are within normal limits. No renal calculi are noted. No obstructive changes are seen. The bladder is partially distended. Stomach/Bowel: The appendix is within normal limits. No obstructive or inflammatory changes of the colon are seen. Mild diverticular changes noted without diverticulitis. Small bowel and stomach appear within normal limits. Vascular/Lymphatic: Aortic atherosclerosis. No enlarged abdominal or pelvic lymph nodes. Reproductive: Uterus and bilateral adnexa are unremarkable. Other: No abdominal wall hernia or abnormality. No abdominopelvic ascites. Musculoskeletal:  No acute or significant osseous findings. IMPRESSION: Diverticulosis without diverticulitis. Changes of prior granulomatous disease. Chronic thickening of the pericardium. This is stable dating back to 2016 and likely related to prior cardiac surgery. Electronically Signed   By: Inez Catalina M.D.   On: 07/10/2020 16:50   US PELVIS LIMITED (TRANSABDOMINAL ONLY)  Result Date: 08/02/2020 CLINICAL DATA:  Pelvic pain. EXAM: TRANSABDOMINAL  ULTRASOUND OF PELVIS TECHNIQUE: Transabdominal ultrasound examination of the pelvis was performed including evaluation of the uterus, ovaries, adnexal regions, and pelvic cul-de-sac. COMPARISON:  Jul 30, 2020. FINDINGS: Uterus Measurements: 6.6 x 2.5 x 3.2 cm = volume: 28 mL. No fibroids or other mass visualized. Endometrium Thickness: 4 mm which is within normal limits. No focal abnormality visualized. Right ovary Not visualized. Left ovary Not visualized. Other findings:  No adnexal lesions are noted. IMPRESSION: Uterus and endometrium are unremarkable. Ovaries are not visualized. No definite adnexal mass is noted. Electronically Signed   By: Marijo Conception M.D.   On: 08/02/2020 20:11   DG Abdomen Acute W/Chest  Result Date: 07/29/2020 CLINICAL DATA:  Left-sided abdominal pain. EXAM: DG ABDOMEN ACUTE WITH 1 VIEW CHEST COMPARISON:  CT of the abdomen pelvis 07/23/2020 FINDINGS: Heart is enlarged. The patient is status post median sternotomy for CABG. Moderate device is in place. Lungs are clear. Bowel gas pattern is unremarkable. No free air or fluid levels are present. Degenerative changes are noted in the lumbar spine. IMPRESSION: 1. No acute cardiopulmonary disease. 2. Normal bowel gas pattern. 3. Cardiomegaly without failure. Electronically Signed   By: San Morelle M.D.   On: 07/29/2020 13:56   DG Abd Portable 1V  Result Date: 07/18/2020 CLINICAL DATA:  Abdominal pain, nausea EXAM: PORTABLE ABDOMEN - 1 VIEW COMPARISON:  CT 07/10/2020 FINDINGS: Nonobstructive bowel gas pattern. Moderate stool burden throughout the colon. Prior cholecystectomy. No organomegaly or free air. IMPRESSION: Moderate stool burden.  No acute findings. Electronically Signed   By: Rolm Baptise M.D.   On: 07/18/2020 12:01   ECHOCARDIOGRAM LIMITED  Result Date: 07/24/2020    ECHOCARDIOGRAM LIMITED REPORT   Patient Name:   Maria Fox Date of Exam: 07/24/2020 Medical Rec #:  [id number]    Height:       62.0 in Accession #:     [id number]   Weight:       146.8 lb Date of Birth:  Sep 20, 1945    BSA:          1.676 m Patient Age:    76 years     BP:           135/89 mmHg Patient Gender: F            HR:           79 bpm. Exam Location:  Inpatient Procedure: Limited Echo, Color Doppler and Cardiac Doppler Indications:    Other abnormalities of the heart R00.8  History:        Patient has prior history of Echocardiogram examinations, most                 recent 07/02/2020. CAD and Previous Myocardial Infarction, Prior                 CABG, Arrythmias:RBBB; Risk Factors:Hypertension, Diabetes and                 Dyslipidemia. History of Aflutter, hypotension.  Sonographer:    Darlina Sicilian RDCS Referring Phys: [place]  1. Left  ventricular ejection fraction, by estimation, is 65 to 70%. The left ventricle has normal function.  2. Right ventricular systolic function is normal. The right ventricular size is normal.  3. The mitral valve is normal in structure. Trivial mitral valve regurgitation.  4. The aortic valve is normal in structure. Aortic valve regurgitation is not visualized. Comparison(s): The left ventricular function is unchanged. FINDINGS  Left Ventricle: Left ventricular ejection fraction, by estimation, is 65 to 70%. The left ventricle has normal function. Right Ventricle: The right ventricular size is normal. Right vetricular wall thickness was not assessed. Right ventricular systolic function is normal. Left Atrium: Left atrial size was normal in size. Right Atrium: Right atrial size was normal in size. Pericardium: There is no evidence of pericardial effusion. Mitral Valve: The mitral valve is normal in structure. Mild mitral annular calcification. Trivial mitral valve regurgitation. Tricuspid Valve: The tricuspid valve is normal in structure. Tricuspid valve regurgitation is trivial. Aortic Valve: The aortic valve is normal in structure. Aortic valve regurgitation is not visualized. Pulmonic Valve: The  pulmonic valve was grossly normal. Pulmonic valve regurgitation is not visualized. Aorta: The aortic root is normal in size and structure. LEFT VENTRICLE PLAX 2D LVOT diam:     1.80 cm LVOT Area:     2.54 cm   SHUNTS Systemic Diam: 1.80 cm [name] MD Electronically signed by [name] MD Signature Date/Time: 07/24/2020/4:31:10 PM    Final      Subjective: No vomiting. Some nausea.  Discharge Exam: Vitals:   08/04/20 2040 08/05/20 0425  BP: 116/75 133/78  Pulse: 71 79  Resp: 17 17  Temp: 98.1 F (36.7 C) 98.2 F (36.8 C)  SpO2: 96% 97%   Vitals:   08/04/20 1136 08/04/20 1420 08/04/20 2040 08/05/20 0425  BP: 132/64 139/80 116/75 133/78  Pulse: 84 72 71 79  Resp:  _0 Temp:  98.6 F (37 C) 98.1 F (36.7 C) 98.2 F (36.8 C)  TempSrc:  Oral Oral Oral  SpO2:  96% 96% 97%  Weight:      Height:        General: Pt is alert, awake, not in acute distress Cardiovascular: RRR, S1/S2 +, no rubs, no gallops Respiratory: CTA bilaterally, no wheezing, no rhonchi Abdominal: Soft, NT, ND, bowel sounds + Extremities: no edema, no cyanosis    The results of significant diagnostics from this hospitalization (including imaging, microbiology, ancillary and laboratory) are listed below for reference.     Microbiology: Recent Results (from the past 240 hour(s))  Urine culture     Status: None   Collection Time: 07/29/20 11:39 AM   Specimen: Urine, Random  Result Value Ref Range Status   Specimen Description URINE, RANDOM  Final   Special Requests NONE  Final   Culture   Final    NO GROWTH Performed at [facility], [address] [place], [place] [address]    Report Status 07/30/2020 FINAL  Final  SARS CORONAVIRUS 2 (TAT 6-24 HRS) Nasopharyngeal Nasopharyngeal Swab     Status: None   Collection Time: 07/29/20  9:50 PM   Specimen: Nasopharyngeal Swab  Result Value Ref Range Status   SARS Coronavirus 2 NEGATIVE NEGATIVE Final    Comment: (NOTE) SARS-CoV-2 target  nucleic acids are NOT DETECTED.  The SARS-CoV-2 RNA is generally detectable in upper and lower respiratory specimens during the acute phase of infection. Negative results do not preclude SARS-CoV-2 infection, do not rule out co-infections with other pathogens,  and should not be used as the sole basis for treatment or other patient management decisions. Negative results must be combined with clinical observations, patient history, and epidemiological information. The expected result is Negative.  Fact Sheet for Patients: SugarRoll.be  Fact Sheet for Healthcare Providers: https://www.woods-mathews.com/  This test is not yet approved or cleared by the [place] FDA and  has been authorized for detection and/or diagnosis of SARS-CoV-2 by FDA under an Emergency Use Authorization (EUA). This EUA will remain  in effect (meaning this test can be used) for the duration of the COVID-19 declaration under Se ction 564(b)(1) of the Act, 21 U.S.C. section 360bbb-3(b)(1), unless the authorization is terminated or revoked sooner.  Performed at [facility], [place] [address], [place], [place] [address]      Labs: BNP (last 3 results) No results for input(s): BNP in the last 8760 hours. Basic Metabolic Panel: Recent Labs  Lab 07/30/20 0636 07/31/20 0156 08/01/20 0101  NA 137 137 137  K 3.3* 3.3* 3.7  CL 107 106 108  CO2 20* 25 23  GLUCOSE 152* 107* 96  BUN 7* 9 <5*  CREATININE 0.95 0.95 0.92  CALCIUM 8.5* 8.3* 8.5*  MG  --  1.5* 1.9   Liver Function Tests: Recent Labs  Lab 08/01/20 0101  AST 18  ALT 13  ALKPHOS 73  BILITOT 0.4  PROT 5.7*  ALBUMIN 2.7*   No results for input(s): LIPASE, AMYLASE in the last 168 hours. No results for input(s): AMMONIA in the last 168 hours. CBC: Recent Labs  Lab 07/30/20 0636 07/31/20 0156 08/01/20 0101  WBC 8.5 8.4 7.3  HGB 11.6* 10.4* 10.5*  HCT 34.7* 30.9* 31.6*  MCV 89.4 90.1 89.3   PLT 274 235 265   Cardiac Enzymes: No results for input(s): CKTOTAL, CKMB, CKMBINDEX, TROPONINI in the last 168 hours. BNP: Invalid input(s): POCBNP CBG: Recent Labs  Lab 08/04/20 1138 08/04/20 1616 08/04/20 2041 08/05/20 0738 08/05/20 1205  GLUCAP 116* 181* 183* 138* 128*   D-Dimer No results for input(s): DDIMER in the last 72 hours. Hgb A1c No results for input(s): HGBA1C in the last 72 hours. Lipid Profile No results for input(s): CHOL, HDL, LDLCALC, TRIG, CHOLHDL, LDLDIRECT in the last 72 hours. Thyroid function studies No results for input(s): TSH, T4TOTAL, T3FREE, THYROIDAB in the last 72 hours.  Invalid input(s): FREET3 Anemia work up No results for input(s): VITAMINB12, FOLATE, FERRITIN, TIBC, IRON, RETICCTPCT in the last 72 hours. Urinalysis    Component Value Date/Time   COLORURINE YELLOW 07/29/2020 1137   APPEARANCEUR HAZY (A) 07/29/2020 1137   LABSPEC 1.021 07/29/2020 1137   PHURINE 5.0 07/29/2020 1137   GLUCOSEU >=500 (A) 07/29/2020 1137   HGBUR NEGATIVE 07/29/2020 1137   BILIRUBINUR NEGATIVE 07/29/2020 1137   BILIRUBINUR negative 06/13/2020 1552   KETONESUR 20 (A) 07/29/2020 1137   PROTEINUR 30 (A) 07/29/2020 1137   UROBILINOGEN 0.2 06/13/2020 1552   UROBILINOGEN 0.2 09/16/2014 0009   NITRITE NEGATIVE 07/29/2020 1137   LEUKOCYTESUR SMALL (A) 07/29/2020 1137   Sepsis Labs Invalid input(s): PROCALCITONIN,  WBC,  LACTICIDVEN Microbiology Recent Results (from the past 240 hour(s))  Urine culture     Status: None   Collection Time: 07/29/20 11:39 AM   Specimen: Urine, Random  Result Value Ref Range Status   Specimen Description URINE, RANDOM  Final   Special Requests NONE  Final   Culture   Final    NO GROWTH Performed at [facility], [address] [place], [place] [address]    Report Status 07/30/2020 FINAL  Final  SARS CORONAVIRUS 2 (TAT 6-24 HRS) Nasopharyngeal Nasopharyngeal Swab     Status: None   Collection Time: 07/29/20  9:50  PM   Specimen: Nasopharyngeal Swab  Result Value Ref Range Status   SARS Coronavirus 2 NEGATIVE NEGATIVE Final    Comment: (NOTE) SARS-CoV-2 target nucleic acids are NOT DETECTED.  The SARS-CoV-2 RNA is generally detectable in upper and lower respiratory specimens during the acute phase of infection. Negative results do not preclude SARS-CoV-2 infection, do not rule out co-infections with other pathogens, and should not be used as the sole basis for treatment or other patient management decisions. Negative results must be combined with clinical observations, patient history, and epidemiological information. The expected result is Negative.  Fact Sheet for Patients: SugarRoll.be  Fact Sheet for Healthcare Providers: https://www.woods-mathews.com/  This test is not yet approved or cleared by the [place] FDA and  has been authorized for detection and/or diagnosis of SARS-CoV-2 by FDA under an Emergency Use Authorization (EUA). This EUA will remain  in effect (meaning this test can be used) for the duration of the COVID-19 declaration under Se ction 564(b)(1) of the Act, 21 U.S.C. section 360bbb-3(b)(1), unless the authorization is terminated or revoked sooner.  Performed at [facility], [place] [address], [place], [place] [address]      Time coordinating discharge: 35 minutes  SIGNED:   Cordelia Poche, MD [facility] 08/05/2020, 12:27 PM

## 2020-08-05 NOTE — Plan of Care (Signed)

## 2020-08-05 NOTE — Discharge Instructions (Signed)
Maria Fox,  You were in the hospital secondary to intractable nausea and vomiting.  This was treated with nausea medication with improvement in her vomiting.  You are still having some nausea.  You are seen by the GI doctor who had no recommendations inpatient than to control the nausea.  You have had previous endoscopies in the past without clear etiology/reason for your symptoms.  This may be related to poorly controlled diabetes and resultant gastroparesis which can affect your GI system.  Please follow-up with your primary care physician and GI as an outpatient.

## 2020-08-08 ENCOUNTER — Telehealth: Payer: Self-pay

## 2020-08-08 NOTE — Telephone Encounter (Signed)
Transition Care Management Unsuccessful Follow-up Telephone Call  Date of discharge and from where:  [facility]/05/2020[facility]  Attempts:  1st Attempt  Reason for unsuccessful TCM follow-up call:  Left voice message

## 2020-08-08 NOTE — Telephone Encounter (Signed)
Transition Care Management Unsuccessful Follow-up Telephone Call  Date of discharge and from where:  [facility]   Attempts:  3rd Attempt  Reason for unsuccessful TCM follow-up call:  Left voice message

## 2020-08-08 NOTE — Telephone Encounter (Signed)
Transition Care Management Unsuccessful Follow-up Telephone Call  Date of discharge and from where:  [facility]/05/[facility]   Attempts:  2nd Attempt  Reason for unsuccessful TCM follow-up call:  No answer/busy

## 2020-08-10 ENCOUNTER — Telehealth: Payer: Self-pay

## 2020-08-10 NOTE — Telephone Encounter (Signed)
Patient called to clarify medication changes.  Reviewed the following: Furosemide 20 mg - take as needed once daily for swelling Metoclopramide 10 mg - take four times daily before meals and bedtime All other medications are in her packaging. She has enough Latanoprost to last until due date 6/19 - scheduled delivery She is taking doxycyline and has been taking it as prescribed. Encouraged her to take her Lantus 10 units daily. She reported [name] is helping with this.  [name], PharmD Clinical Pharmacist [facility] at [facility] [phone]

## 2020-08-10 NOTE — Progress Notes (Signed)
Refill for Latanoprost submitted to pharmacy for delivery 08/25/2020.  [name], CPP notified  Avel Sensor, [place] Assistant [phone]  Total time spent for month CPA: 8 min

## 2020-08-14 ENCOUNTER — Encounter: Payer: Self-pay | Admitting: Podiatry

## 2020-08-14 ENCOUNTER — Ambulatory Visit (INDEPENDENT_AMBULATORY_CARE_PROVIDER_SITE_OTHER): Payer: Medicare Other | Admitting: Podiatry

## 2020-08-14 ENCOUNTER — Other Ambulatory Visit: Payer: Self-pay

## 2020-08-14 DIAGNOSIS — B351 Tinea unguium: Secondary | ICD-10-CM | POA: Diagnosis not present

## 2020-08-14 DIAGNOSIS — M79675 Pain in left toe(s): Secondary | ICD-10-CM | POA: Diagnosis not present

## 2020-08-14 DIAGNOSIS — M79674 Pain in right toe(s): Secondary | ICD-10-CM | POA: Diagnosis not present

## 2020-08-14 DIAGNOSIS — E0865 Diabetes mellitus due to underlying condition with hyperglycemia: Secondary | ICD-10-CM

## 2020-08-20 NOTE — Progress Notes (Signed)
Subjective: Maria Fox is a pleasant 75 y.o. female patient seen today painful thick toenails that are difficult to trim. Pain interferes with ambulation. Aggravating factors include wearing enclosed shoe gear. Pain is relieved with periodic professional debridement.  Patient states she has been hospitalized several times since her last visit. She has not checked her blood glucose.   PCP is [name], MD. Last visit was: 06/13/2020.  Allergies  Allergen Reactions   Bee Venom     Throat swelling   Mushroom Extract Complex Anaphylaxis   Penicillins Anaphylaxis, Hives and Swelling    Tolerates cephalosporins including cephalexin multiple times.  Has patient had a PCN reaction causing immediate rash, facial/tongue/throat swelling, SOB or lightheadedness with hypotension: Yes Has patient had a PCN reaction causing severe rash involving mucus membranes or skin necrosis: Yes Has patient had a PCN reaction that required hospitalization Yes Has patient had a PCN reaction occurring within the last 10 years: No     Codeine Nausea And Vomiting   Sulfa Antibiotics Nausea And Vomiting   Iohexol Itching and Swelling   Erythromycin Base Rash    Objective: Physical Exam  General: Maria Fox is a pleasant 75 y.o. Caucasian female, in NAD. AAO x 3.   Vascular:  Capillary fill time to digits <3 seconds b/l lower extremities. Palpable DP pulse(s) b/l lower extremities Palpable PT pulse(s) b/l lower extremities Pedal hair sparse. Lower extremity skin temperature gradient within normal limits. No pain with calf compression b/l.  Dermatological:  Pedal skin with normal turgor, texture and tone bilaterally. No open wounds bilaterally. No interdigital macerations bilaterally. Toenails 2-5 bilaterally well maintained with adequate length. No erythema, no edema, no drainage, no fluctuance. Anonychia noted L hallux and R hallux. Nailbed(s) epithelialized.   Musculoskeletal:  Normal muscle  strength 5/5 to all lower extremity muscle groups bilaterally. No pain crepitus or joint limitation noted with ROM b/l. No gross bony deformities bilaterally.  Neurological:  Protective sensation intact 5/5 intact bilaterally with 10g monofilament b/l. Vibratory sensation intact b/l.  Hemoglobin A1C Latest Ref Rng & Units 07/03/2020 05/02/2020 11/08/2019  HGBA1C 4.8 - 5.6 % 11.6(H) 12.2(H) 11.2(A)  Some recent data might be hidden    Assessment and Plan:  1. Pain due to onychomycosis of toenails of both feet   2. Diabetes mellitus due to underlying condition, uncontrolled, with hyperglycemia ([place])      -Examined patient. She will be following up with her PCP regarding her diabetes and recent hospitalization. -Continue diabetic foot care principles. -Patient to continue soft, supportive shoe gear daily. -Toenails 2-5 b/l were debrided in length and girth with sterile nail nippers and dremel without iatrogenic bleeding.  -Patient to report any pedal injuries to medical professional immediately. -Patient/POA to call should there be question/concern in the interim.  Return in about 3 months (around 11/14/2020).  Marzetta Board, DPM

## 2020-08-21 ENCOUNTER — Telehealth: Payer: Self-pay

## 2020-08-21 NOTE — Chronic Care Management (AMB) (Addendum)
Chronic Care Management Pharmacy Assistant   Name: Maria Fox  MRN: [id number] DOB: [birth year]  Reason for Encounter: Disease State- Diabetes  Recent office visits:  None since last CCM contact  Recent consult visits:  08/14/20- Podiatry- No changes  Hospital visits:  Medication Reconciliation was completed by comparing discharge summary, patient's EMR and Pharmacy list, and upon discussion with patient.  Admitted to the hospital on 07/29/20 due to functional abdominal pain syndrome. Discharge date was 08/05/20. Discharged from [facility]?Medications Started at [facility] Discharge:?? -started Doxycycline morning and bedtime and Glucerna shake morning and afternoon.   Medication Changes at Hospital Discharge: -Changed metoclopramide 10 mg to 4 times daily  Medications Discontinued at Hospital Discharge: -N/A  Medications that remain the same after Hospital Discharge:??  -All other medications will remain the same.     07/10/20 Admission to [facility] for Lower UTI- 07/10/20-07/27/20.   Medications: Outpatient Encounter Medications as of 08/21/2020  Medication Sig Note   acetaminophen (TYLENOL) 500 MG tablet Take 500 mg by mouth as needed for moderate pain.    albuterol (PROAIR HFA) 108 (90 Base) MCG/ACT inhaler Inhale 2 puffs into the lungs every 6 (six) hours as needed for wheezing or shortness of breath.    apixaban (ELIQUIS) 5 MG TABS tablet Take 1 tablet (5 mg total) by mouth 2 (two) times daily.    atorvastatin (LIPITOR) 40 MG tablet TAKE 1 TABLET BY MOUTH EVERYDAY AT BEDTIME (Patient taking differently: Take 40 mg by mouth at bedtime.)    Blood Glucose Monitoring Suppl (ONE TOUCH ULTRA 2) w/Device KIT Use as instructed to check blood sugar 2 times daily as needed.    clopidogrel (PLAVIX) 75 MG tablet Take 1 tablet (75 mg total) by mouth daily.    Cyanocobalamin (VITAMIN B12) 500 MCG TABS Take 2 tablets by mouth daily.    dapagliflozin propanediol (FARXIGA)  5 MG TABS tablet Take 5 mg by mouth daily.    docusate sodium (COLACE) 100 MG capsule Take 1 capsule (100 mg total) by mouth daily. (Patient taking differently: Take 100 mg by mouth daily as needed for mild constipation.) 07/01/2020: Takes as needed   DULoxetine (CYMBALTA) 20 MG capsule Take 20 mg by mouth daily.    DULoxetine (CYMBALTA) 30 MG capsule Take 30 mg by mouth every morning.    feeding supplement, GLUCERNA SHAKE, (GLUCERNA SHAKE) LIQD Take 237 mLs by mouth 3 (three) times daily between meals.    furosemide (LASIX) 20 MG tablet Take 1 tablet (20 mg total) by mouth daily as needed for fluid or edema (leg swelling).    gabapentin (NEURONTIN) 300 MG capsule Take 1 capsule (300 mg total) by mouth 2 (two) times daily.    glipiZIDE (GLUCOTROL) 5 MG tablet TAKE 1 TABLET (5 MG TOTAL) BY MOUTH 2 (TWO) TIMES DAILY BEFORE A MEAL. (Patient taking differently: Take 5 mg by mouth 2 (two) times daily before a meal.)    glucose blood (ONETOUCH ULTRA) test strip Use as instructed to check blood sugar 2 times daily as needed    insulin glargine (LANTUS SOLOSTAR) 100 UNIT/ML Solostar Pen Inject 10 Units into the skin daily.    isosorbide mononitrate (IMDUR) 30 MG 24 hr tablet Take 0.5 tablets (15 mg total) by mouth daily.    latanoprost (XALATAN) 0.005 % ophthalmic solution Place 1 drop into both eyes at bedtime.     metFORMIN (GLUCOPHAGE-XR) 500 MG 24 hr tablet Take 1,000 mg by mouth every  morning.    metoCLOPramide (REGLAN) 10 MG tablet Take 1 tablet (10 mg total) by mouth 4 (four) times daily -  before meals and at bedtime.    metoprolol succinate (TOPROL XL) 25 MG 24 hr tablet Take 1 tablet (25 mg total) by mouth daily.    nitroGLYCERIN (NITROSTAT) 0.4 MG SL tablet Place 1 tablet (0.4 mg total) under the tongue every 5 (five) minutes x 3 doses as needed for chest pain.    ondansetron (ZOFRAN-ODT) 4 MG disintegrating tablet Take 1 tablet (4 mg total) by mouth every 8 (eight) hours as needed for nausea or  vomiting.    OneTouch Delica Lancets 91Y MISC Use as directed to check blood sugar 2 times daily as needed    oxyCODONE (OXY IR/ROXICODONE) 5 MG immediate release tablet Take 1 tablet (5 mg total) by mouth every 6 (six) hours as needed for severe pain.    pantoprazole (PROTONIX) 40 MG tablet Take 1 tablet (40 mg total) by mouth 2 (two) times daily. (Patient taking differently: Take 40 mg by mouth 2 (two) times daily.)    polyethylene glycol (MIRALAX / GLYCOLAX) 17 g packet Take 17 g by mouth daily as needed for moderate constipation.    No facility-administered encounter medications on file as of 08/21/2020.     Recent Relevant Labs: Lab Results  Component Value Date/Time   HGBA1C 11.6 (H) 07/03/2020 09:37 AM   HGBA1C 12.2 (H) 05/02/2020 03:25 PM   MICROALBUR <0.7 05/02/2020 03:25 PM   MICROALBUR <0.7 11/08/2019 05:08 PM    Kidney Function Lab Results  Component Value Date/Time   CREATININE 0.92 08/01/2020 01:01 AM   CREATININE 0.95 07/31/2020 01:56 AM   GFR 37.02 (L) 05/02/2020 03:25 PM   GFRNONAA >60 08/01/2020 01:01 AM   GFRAA 41 (L) 01/28/2020 03:32 PM   Current antihyperglycemic regimen:  Farxiga 5 mg - 1 tablet daily (1 breakfast) Glipizide 5 mg - 1 tablet twice daily (1 breakfast, 1 evening meal) Metformin 500 mg ER - 2 tablets at breakfast (2 breakfast) Lantus Solostar - inject 10 units into skin daily- has not started   Patient verbally confirms she is taking the above medications as directed. Yes - except not taking her insulin  What diet changes have been made to improve diabetes control? Denies any changes to diet. States she does not each much. Notes she eats a lot of apple sauce.   What recent interventions/DTPs have been made to improve glycemic control:  Resumed metformin 07/28/20  Have there been any recent hospitalizations or ED visits since last visit with CPP? Yes  Patient denies hypoglycemic symptoms, including Pale, Sweaty, Shaky, Hungry,  Nervous/irritable, and Vision changes  Patient denies hyperglycemic symptoms, including blurry vision, excessive thirst, fatigue, polyuria, and weakness  How often are you checking your blood sugar? States she has not checked at all since getting out of hospital. She states she is not in the right mental space.  What are your blood sugars ranging? N/A  On insulin? No- states she can't get her mind right. Can not start insulin due to her mind set.   During the week, how often does your blood glucose drop below 70? Unknown.   Are you checking your feet daily/regularly? Yes  Adherence Review: Is the patient currently on a STATIN medication? Yes Is the patient currently on ACE/ARB medication? No Does the patient have >5 day gap between last estimated fill dates? No  Care Gaps: Last annual wellness visit: 06/13/20 Last eye  exam / retinopathy screening: 03/23/20- Dr. Katy Fitch Last diabetic foot exam: Podiatry visit 08/14/20  Star Rating Drugs:  Medication:  Last Fill: Day Supply Metformin 500 mg 08/07/20 30 Farxiga 5 mg  08/07/20 30 Atorvastatin 40 mg 08/07/20 30 Glipizide 5 mg  08/07/20 30   PCP appointment on 09/15/20 and Cardiology appointment on 08/25/20   Follow-Up:  Pharmacist Review  [name], CPP notified  Margaretmary Dys, [place] Assistant [phone]  I have reviewed the care management and care coordination activities outlined in this encounter and I am certifying that I agree with the content of this note. Appt scheduled for 09/06/20.  [name], PharmD Clinical Pharmacist [facility] at [facility] [phone]

## 2020-08-25 ENCOUNTER — Telehealth: Payer: Self-pay

## 2020-08-25 ENCOUNTER — Ambulatory Visit: Payer: Medicare Other | Admitting: Family

## 2020-08-25 NOTE — Chronic Care Management (AMB) (Addendum)
Chronic Care Management Pharmacy Assistant   Name: Maria Fox  MRN: [id number] DOB: 08/02/1945 . Reason for Encounter: Medication Adherence and Delivery Coordination  Recent office visits:  None since last CCM contact  Recent consult visits:  08/14/20 - Podiatry no medication changes  Fox visits:  07/29/20 thru 08/05/20 - Maria Fox   Abdominal pain  07/10/20 thru 07/27/20 - Maria Fox  - Acute Cystitis  07/01/20 thru 07/04/20 - Maria Fox - Chest pain    Medications: Outpatient Encounter Medications as of 08/25/2020  Medication Sig Note   acetaminophen (TYLENOL) 500 MG tablet Take 500 mg by mouth as needed for moderate pain.    albuterol (PROAIR HFA) 108 (90 Base) MCG/ACT inhaler Inhale 2 puffs into the lungs every 6 (six) hours as needed for wheezing or shortness of breath.    apixaban (ELIQUIS) 5 MG TABS tablet Take 1 tablet (5 mg total) by mouth 2 (two) times daily.    atorvastatin (LIPITOR) 40 MG tablet TAKE 1 TABLET BY MOUTH EVERYDAY AT BEDTIME (Patient taking differently: Take 40 mg by mouth at bedtime.)    Blood Glucose Monitoring Suppl (ONE TOUCH ULTRA 2) w/Device KIT Use as instructed to check blood sugar 2 times daily as needed.    clopidogrel (PLAVIX) 75 MG tablet Take 1 tablet (75 mg total) by mouth daily.    Cyanocobalamin (VITAMIN B12) 500 MCG TABS Take 2 tablets by mouth daily.    dapagliflozin propanediol (FARXIGA) 5 MG TABS tablet Take 5 mg by mouth daily.    docusate sodium (COLACE) 100 MG capsule Take 1 capsule (100 mg total) by mouth daily. (Patient taking differently: Take 100 mg by mouth daily as needed for mild constipation.) 07/01/2020: Takes as needed   DULoxetine (CYMBALTA) 20 MG capsule Take 20 mg by mouth daily.    DULoxetine (CYMBALTA) 30 MG capsule Take 30 mg by mouth every morning.    feeding supplement, GLUCERNA SHAKE, (GLUCERNA SHAKE) LIQD Take 237 mLs by mouth 3 (three) times daily between meals.    furosemide (LASIX) 20  MG tablet Take 1 tablet (20 mg total) by mouth daily as needed for fluid or edema (leg swelling).    gabapentin (NEURONTIN) 300 MG capsule Take 1 capsule (300 mg total) by mouth 2 (two) times daily.    glipiZIDE (GLUCOTROL) 5 MG tablet TAKE 1 TABLET (5 MG TOTAL) BY MOUTH 2 (TWO) TIMES DAILY BEFORE A MEAL. (Patient taking differently: Take 5 mg by mouth 2 (two) times daily before a meal.)    glucose blood (ONETOUCH ULTRA) test strip Use as instructed to check blood sugar 2 times daily as needed    insulin glargine (LANTUS SOLOSTAR) 100 UNIT/ML Solostar Pen Inject 10 Units into the skin daily.    isosorbide mononitrate (IMDUR) 30 MG 24 hr tablet Take 0.5 tablets (15 mg total) by mouth daily.    latanoprost (XALATAN) 0.005 % ophthalmic solution Place 1 drop into both eyes at bedtime.     metFORMIN (GLUCOPHAGE-XR) 500 MG 24 hr tablet Take 1,000 mg by mouth every morning.    metoCLOPramide (REGLAN) 10 MG tablet Take 1 tablet (10 mg total) by mouth 4 (four) times daily -  before meals and at bedtime.    metoprolol succinate (TOPROL XL) 25 MG 24 hr tablet Take 1 tablet (25 mg total) by mouth daily.    nitroGLYCERIN (NITROSTAT) 0.4 MG SL tablet Place 1 tablet (0.4 mg total) under the tongue every 5 (five) minutes x  3 doses as needed for chest pain.    ondansetron (ZOFRAN-ODT) 4 MG disintegrating tablet Take 1 tablet (4 mg total) by mouth every 8 (eight) hours as needed for nausea or vomiting.    OneTouch Delica Lancets 57Q MISC Use as directed to check blood sugar 2 times daily as needed    oxyCODONE (OXY IR/ROXICODONE) 5 MG immediate release tablet Take 1 tablet (5 mg total) by mouth every 6 (six) hours as needed for severe pain.    pantoprazole (PROTONIX) 40 MG tablet Take 1 tablet (40 mg total) by mouth 2 (two) times daily. (Patient taking differently: Take 40 mg by mouth 2 (two) times daily.)    polyethylene glycol (MIRALAX / GLYCOLAX) 17 g packet Take 17 g by mouth daily as needed for moderate  constipation.    No facility-administered encounter medications on file as of 08/25/2020.   BP Readings from Last 3 Encounters:  08/05/20 133/78  07/27/20 138/82  07/04/20 (!) 108/46    Lab Results  Component Value Date   HGBA1C 11.6 (H) 07/03/2020    Patient obtains medications through Adherence Packaging  30 Days   Patient is due for next adherence delivery on: 09/05/2020  Spoke with patient on 08/28/2020 and reviewed medications and coordinated delivery.  This delivery to include: Adherence Packaging  30 Days  Packs: Atorvastatin 40 mg - 1 tablet daily (1 evening meal) Clopidogrel 75 mg - 1 tablet daily (1 breakfast) Duloxetine 30 mg - 1 capsule daily (1 breakfast) Eliquis 5 mg- 1 tablet twice daily (1 breakfast, 1 evening meal) Farxiga 5 mg - 1 tablet daily (1 breakfast) Gabapentin 300 mg- 1 tablet twice daily (1 breakfast, 1 evening meal) Glipizide 5 mg - 1 tablet twice daily (1 breakfast, 1 evening meal) Isosorbide Mononitrate 30 mg - 1/2 tablet daily (1/2 breakfast) Metformin 500 mg ER - 2 tablets at breakfast (2 breakfast) Metoprolol succinate 25 mg ER - 1 tablet daily at breakfast (1 breakfast) Pantoprazole 40 mg - 1 tablet twice daily (1 breakfast, 1 evening meal)  Patient declined the following medications this month: Latanoprost 0.005 % eye drops - 1 drop in each eye daily (delivered 6/17) Furosemide 20 mg - 1 tablet daily as needed (has adequate supply) Metoclopramide - 1 tablet four times daily (has adequate supply)  No refill request needed from PCP.  Confirmed delivery date of 09/05/2020 advised patient that pharmacy will contact her the morning of delivery.  The patient did not have any BG readings available The patient is not taking her insulin. She was encouraged to do so. She states Maria Fox will assist.  Follow-Up:  Maria Fox and Maria Fox  Maria Fox notified  Maria Fox, [place]  Assistant [phone]  I have reviewed the care management and care coordination activities outlined in this encounter and I am certifying that I agree with the content of this note. No further action required.  Maria Fox Clinical [facility] at [facility] [phone]

## 2020-09-01 DIAGNOSIS — H00021 Hordeolum internum right upper eyelid: Secondary | ICD-10-CM | POA: Diagnosis not present

## 2020-09-06 ENCOUNTER — Telehealth: Payer: Self-pay

## 2020-09-06 ENCOUNTER — Telehealth: Payer: Medicare Other

## 2020-09-06 DIAGNOSIS — H00021 Hordeolum internum right upper eyelid: Secondary | ICD-10-CM | POA: Diagnosis not present

## 2020-09-07 DIAGNOSIS — L80 Vitiligo: Secondary | ICD-10-CM | POA: Diagnosis not present

## 2020-09-07 DIAGNOSIS — L559 Sunburn, unspecified: Secondary | ICD-10-CM | POA: Diagnosis not present

## 2020-09-07 NOTE — Telephone Encounter (Signed)
  Chronic Care Management   Outreach Note  09/06/20 Name: ARYANNA SHAVER MRN: [id number] DOB: October 25, 1945  Referred by: [name], MD Reason for referral : Missed CCM Appointment   An unsuccessful telephone outreach was attempted today. The patient was referred to the pharmacist for assistance with care management and care coordination.   [name], PharmD Clinical Pharmacist [facility] at [facility] [phone]

## 2020-09-08 ENCOUNTER — Other Ambulatory Visit: Payer: Self-pay

## 2020-09-08 ENCOUNTER — Emergency Department (HOSPITAL_COMMUNITY)
Admission: EM | Admit: 2020-09-08 | Discharge: 2020-09-09 | Disposition: A | Discharge status: Home / Self Care | Payer: Medicare Other | Attending: Emergency Medicine | Admitting: Emergency Medicine

## 2020-09-08 ENCOUNTER — Encounter (HOSPITAL_COMMUNITY): Payer: Self-pay | Admitting: *Deleted

## 2020-09-08 DIAGNOSIS — Z951 Presence of aortocoronary bypass graft: Secondary | ICD-10-CM | POA: Insufficient documentation

## 2020-09-08 DIAGNOSIS — I2510 Atherosclerotic heart disease of native coronary artery without angina pectoris: Secondary | ICD-10-CM | POA: Diagnosis not present

## 2020-09-08 DIAGNOSIS — I5030 Unspecified diastolic (congestive) heart failure: Secondary | ICD-10-CM | POA: Insufficient documentation

## 2020-09-08 DIAGNOSIS — L550 Sunburn of first degree: Secondary | ICD-10-CM | POA: Insufficient documentation

## 2020-09-08 DIAGNOSIS — I110 Hypertensive heart disease with heart failure: Secondary | ICD-10-CM | POA: Diagnosis not present

## 2020-09-08 DIAGNOSIS — Z955 Presence of coronary angioplasty implant and graft: Secondary | ICD-10-CM | POA: Diagnosis not present

## 2020-09-08 DIAGNOSIS — L559 Sunburn, unspecified: Secondary | ICD-10-CM

## 2020-09-08 DIAGNOSIS — E1149 Type 2 diabetes mellitus with other diabetic neurological complication: Secondary | ICD-10-CM | POA: Insufficient documentation

## 2020-09-08 DIAGNOSIS — Z8616 Personal history of COVID-19: Secondary | ICD-10-CM | POA: Diagnosis not present

## 2020-09-08 DIAGNOSIS — R21 Rash and other nonspecific skin eruption: Secondary | ICD-10-CM | POA: Diagnosis present

## 2020-09-08 MED ORDER — ONDANSETRON 4 MG PO TBDP
8.0000 mg | ORAL_TABLET | Freq: Once | ORAL | Status: AC
Start: 2020-09-08 — End: 2020-09-08
  Administered 2020-09-08: 8 mg via ORAL

## 2020-09-08 MED ORDER — HYDROCODONE-ACETAMINOPHEN 5-325 MG PO TABS
1.0000 | ORAL_TABLET | Freq: Once | ORAL | Status: AC
  Administered 2020-09-08: 1 via ORAL
  Filled 2020-09-08: qty 1

## 2020-09-08 NOTE — ED Triage Notes (Signed)
The pt was in the sun yesterdfay and today she is having so much pain.   She has a skin condition

## 2020-09-08 NOTE — ED Provider Notes (Signed)
Emergency Medicine Provider Triage Evaluation Note  Maria Fox , a 75 y.o. female  was evaluated in triage.  Pt complains of sunburn. Pt has vitiligo across all four extremities and her scalp. She was out in the sun and became sunburned in the regions of depigmentation and complains of severe pain. No other complaints.   Physical Exam  BP 140/79   Pulse 100   Temp 99 F (37.2 C)   Resp 16   SpO2 99%  Gen:   Awake, no distress   Resp:  Normal effort  MSK:   Moves extremities without difficulty  Other:    Medical Decision Making  Medically screening exam initiated at 6:05 PM.  Appropriate orders placed.  Maria Fox was informed that the remainder of the evaluation will be completed by another provider, this initial triage assessment does not replace that evaluation, and the importance of remaining in the ED until their evaluation is complete.   Maria Sexton, PA-C 09/08/20 1806    Maria Freeze, MD 09/08/20 2134

## 2020-09-09 MED ORDER — ALOE VERA 72 % EX CREA: TOPICAL_CREAM | CUTANEOUS | 0 refills | Status: DC
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
## 2020-09-09 NOTE — Discharge Instructions (Addendum)
You were evaluated in the Emergency Department and after careful evaluation, we did not find any emergent condition requiring admission or further testing in the hospital.  Your exam/testing today was overall reassuring.  Symptoms seem to be due to sunburns.  Use the aloe vera cream as directed, use Tylenol or Motrin for pain.  Please return to the Emergency Department if you experience any worsening of your condition.  Thank you for allowing [place] to be a part of your care.

## 2020-09-15 ENCOUNTER — Ambulatory Visit: Payer: Medicare Other | Admitting: Family Medicine

## 2020-09-20 ENCOUNTER — Telehealth: Payer: Self-pay | Admitting: Family Medicine

## 2020-09-20 NOTE — Telephone Encounter (Signed)
Pt missed appt last week. Needs to reschedule Multiple ER visits and hospitalizations since last seen.

## 2020-09-21 ENCOUNTER — Telehealth: Payer: Self-pay
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
## 2020-09-25 NOTE — Telephone Encounter (Signed)
Left voice message to call the office  

## 2020-09-28 NOTE — Addendum Note (Signed)
Addended by: [name] on: 09/28/2020 02:58 PM   Modules accepted: Orders

## 2020-10-17 ENCOUNTER — Ambulatory Visit (INDEPENDENT_AMBULATORY_CARE_PROVIDER_SITE_OTHER): Payer: Medicare Other

## 2020-10-17 ENCOUNTER — Other Ambulatory Visit: Payer: Self-pay

## 2020-10-17 DIAGNOSIS — F332 Major depressive disorder, recurrent severe without psychotic features: Secondary | ICD-10-CM | POA: Diagnosis not present

## 2020-10-17 DIAGNOSIS — E1165 Type 2 diabetes mellitus with hyperglycemia: Secondary | ICD-10-CM

## 2020-10-17 DIAGNOSIS — E1149 Type 2 diabetes mellitus with other diabetic neurological complication: Secondary | ICD-10-CM

## 2020-10-17 DIAGNOSIS — IMO0002 Reserved for concepts with insufficient information to code with codable children: Secondary | ICD-10-CM

## 2020-10-17 NOTE — Progress Notes (Signed)
Chronic Care Management Pharmacy Note  10/17/2020 Name:  Maria Fox MRN:  [id number] DOB:  30-May-1945  Summary: Discussed DM and depression. She feels very depressed. Poor appetite, no interest in activities. She is not having any more stomach pain. Not  monitoring BG or taking her insulin, "no motivation". She missed last PCP and CCM visits. [name] is with her on call today and they agree to schedule PCP follow up to discuss depression treatment. [name] is supportive and encourages Joelie to care for herself. She is compliant with her oral medications which are in bubble packs including duloxetine 30 mg daily, metformin 1000 mg/day, glipizide, and Farxiga.  Recommendations/Changes made from today's visit: Schedule PCP visit. Discussed lifestyle changes to improve mood including hobbies, social activities, self care. Discussed impact of uncontrolled glucose on her health.  Plan: Follow up with PCP scheduled - November 06, 2020 3:30 PM. CMA follow up call early September to encourage home BG monitoring  Subjective: Maria Fox is an 75 y.o. year old female who is a primary patient of [name], MD.  The CCM team was consulted for assistance with disease management and care coordination needs.    Engaged with patient by telephone for follow up visit in response to provider referral for pharmacy case management and/or care coordination services.   Consent to Services:  The patient was given information about Chronic Care Management services, agreed to services, and gave verbal consent prior to initiation of services.  Please see initial visit note for detailed documentation.   Patient Care Team: [name], MD as PCP - General (Family Medicine) Sueanne Margarita, MD as PCP - Cardiology (Cardiology) [name], [facility] as Pharmacist (Pharmacist)  Recent office visits: 09/15/20 - PCP - No show  Recent consult visits: 08/14/20 [facility] visits: 09/08/20 - ED visit for  sunburn  Objective:  Lab Results  Component Value Date   CREATININE 0.92 08/01/2020   BUN <5 (L) 08/01/2020   GFR 37.02 (L) 05/02/2020   GFRNONAA >60 08/01/2020   GFRAA 41 (L) 01/28/2020   NA 137 08/01/2020   K 3.7 08/01/2020   CALCIUM 8.5 (L) 08/01/2020   CO2 23 08/01/2020   GLUCOSE 96 08/01/2020    Lab Results  Component Value Date/Time   HGBA1C 11.6 (H) 07/03/2020 09:37 AM   HGBA1C 12.2 (H) 05/02/2020 03:25 PM   FRUCTOSAMINE 302 (H) 11/10/2015 04:27 PM   GFR 37.02 (L) 05/02/2020 03:25 PM   GFR 41.63 (L) 08/03/2019 01:03 PM   MICROALBUR <0.7 05/02/2020 03:25 PM   MICROALBUR <0.7 11/08/2019 05:08 PM    Last diabetic Eye exam:  Lab Results  Component Value Date/Time   HMDIABEYEEXA Retinopathy (A) 03/23/2020 12:00 AM    Last diabetic Foot exam: Completed with  Podiatry 08/14/20  Lab Results  Component Value Date   CHOL 109 05/02/2020   HDL 41.10 05/02/2020   LDLCALC 40 05/02/2020   LDLDIRECT 134.9 10/21/2011   TRIG 139.0 05/02/2020   CHOLHDL 3 05/02/2020    Hepatic Function Latest Ref Rng & Units 08/01/2020 07/29/2020 07/11/2020  Total Protein 6.5 - 8.1 g/dL 5.7(L) 7.8 5.7(L)  Albumin 3.5 - 5.0 g/dL 2.7(L) 3.7 2.6(L)  AST 15 - 41 U/L '18 22 19  ' ALT 0 - 44 U/L '13 20 14  ' Alk Phosphatase 38 - 126 U/L 73 109 61  Total Bilirubin 0.3 - 1.2 mg/dL 0.4 1.2 0.1(L)  Bilirubin, Direct 0.00 - 0.40 mg/dL - - -    Lab  Results  Component Value Date/Time   TSH 1.187 07/15/2020 06:11 AM   TSH 1.247 07/10/2019 10:27 AM   TSH 2.64 05/02/2017 04:13 PM   TSH 3.457 07/08/2014 05:00 AM   FREET4 0.87 07/13/2014 11:18 AM    CBC Latest Ref Rng & Units 08/01/2020 07/31/2020 07/30/2020  WBC 4.0 - 10.5 K/uL 7.3 8.4 8.5  Hemoglobin 12.0 - 15.0 g/dL 10.5(L) 10.4(L) 11.6(L)  Hematocrit 36.0 - 46.0 % 31.6(L) 30.9(L) 34.7(L)  Platelets 150 - 400 K/uL 265 235 274    Lab Results  Component Value Date/Time   VD25OH 32.36 05/02/2020 03:25 PM    Clinical ASCVD: Yes  The ASCVD Risk score  Mikey Bussing DC Jr., et al., 2013) failed to calculate for the following reasons:   The patient has a prior MI or stroke diagnosis    Depression screen [facility] 2/9 06/06/2020 05/02/2020 03/15/2019  Decreased Interest 2 2 0  Down, Depressed, Hopeless 2 2 0  PHQ - 2 Score 4 4 0  Altered sleeping 0 0 -  Tired, decreased energy 0 2 -  Change in appetite 0 2 -  Feeling bad or failure about yourself  0 2 -  Trouble concentrating 0 1 -  Moving slowly or fidgety/restless 0 1 -  Suicidal thoughts 0 0 -  PHQ-9 Score 4 12 -  Difficult doing work/chores Not difficult at all Very difficult -  Some recent data might be hidden    Social History   Tobacco Use  Smoking Status Never  Smokeless Tobacco Never   BP Readings from Last 3 Encounters:  09/09/20 130/65  08/05/20 133/78  07/27/20 138/82   Pulse Readings from Last 3 Encounters:  09/09/20 70  08/05/20 79  07/27/20 81   Wt Readings from Last 3 Encounters:  09/08/20 140 lb 3.4 oz (63.6 kg)  07/29/20 140 lb 3.4 oz (63.6 kg)  07/11/20 146 lb 13.2 oz (66.6 kg)   BMI Readings from Last 3 Encounters:  09/08/20 24.84 kg/m  07/29/20 24.84 kg/m  07/11/20 26.85 kg/m    Assessment/Interventions: Review of patient past medical history, allergies, medications, health status, including review of consultants reports, laboratory and other test data, was performed as part of comprehensive evaluation and provision of chronic care management services.   SDOH:  (Social Determinants of Health) assessments and interventions performed: Yes  SDOH Screenings   Alcohol Screen: Low Risk    Last Alcohol Screening Score (AUDIT): 0  Depression (PHQ2-9): Low Risk    PHQ-2 Score: 4  Financial Resource Strain: Low Risk    Difficulty of Paying Living Expenses: Not hard at all  Food Insecurity: No Food Insecurity   Worried About Charity fundraiser in the Last Year: Never true   Ran Out of Food in the Last Year: Never true  Housing: Low Risk    Last Housing Risk  Score: 0  Physical Activity: Inactive   Days of Exercise per Week: 0 days   Minutes of Exercise per Session: 0 min  Social Connections: Not on file  Stress: No Stress Concern Present   Feeling of Stress : Not at all  Tobacco Use: Low Risk    Smoking Tobacco Use: Never   Smokeless Tobacco Use: Never  Transportation Needs: No Transportation Needs   Lack of Transportation (Medical): No   Lack of Transportation (Non-Medical): No    CCM Care Plan  Allergies  Allergen Reactions   Bee Venom     Throat swelling   Mushroom Extract Complex Anaphylaxis  Penicillins Anaphylaxis, Hives and Swelling    Tolerates cephalosporins including cephalexin multiple times.  Has patient had a PCN reaction causing immediate rash, facial/tongue/throat swelling, SOB or lightheadedness with hypotension: Yes Has patient had a PCN reaction causing severe rash involving mucus membranes or skin necrosis: Yes Has patient had a PCN reaction that required hospitalization Yes Has patient had a PCN reaction occurring within the last 10 years: No     Codeine Nausea And Vomiting   Sulfa Antibiotics Nausea And Vomiting   Iohexol Itching and Swelling   Erythromycin Base Rash    Medications Reviewed Today     Reviewed by [name], [facility] (Pharmacist) on 10/17/20 at [phone]  Med List Status: <None>   Medication Order Taking? Sig Documenting Provider Last Dose Status Informant  acetaminophen (TYLENOL) 500 MG tablet [id number] No Take 500 mg by mouth as needed for moderate pain.  Patient not taking: No sig reported   [provider] Not Taking Active   albuterol (PROAIR HFA) 108 (90 Base) MCG/ACT inhaler [id number] No Inhale 2 puffs into the lungs every 6 (six) hours as needed for wheezing or shortness of breath.  Patient not taking: No sig reported   [name], MD Not Taking Active   Aloe Vera 72 % CREA [id number] No Apply to burned areas four times daily.  Patient not taking: Reported on 10/17/2020    Maudie Flakes, MD Not Taking Active   apixaban (ELIQUIS) 5 MG TABS tablet [id number] Yes Take 1 tablet (5 mg total) by mouth 2 (two) times daily. [name], MD Taking Active Self  atorvastatin (LIPITOR) 40 MG tablet [id number] Yes TAKE 1 TABLET BY MOUTH EVERYDAY AT BEDTIME  Patient taking differently: Take 40 mg by mouth at bedtime.   [name], MD Taking Active   Blood Glucose Monitoring Suppl (ONE TOUCH ULTRA 2) w/Device KIT [id number] No Use as instructed to check blood sugar 2 times daily as needed.  Patient not taking: No sig reported   [name], MD Not Taking Active   clopidogrel (PLAVIX) 75 MG tablet [id number] Yes Take 1 tablet (75 mg total) by mouth daily. Sueanne Margarita, MD Taking Active Self  Cyanocobalamin (VITAMIN B12) 500 MCG TABS [id number]  Take 2 tablets by mouth daily.  Patient not taking: Reported on 09/28/2020   [provider]  Active Self  dapagliflozin propanediol (FARXIGA) 5 MG TABS tablet [id number] Yes Take 5 mg by mouth daily. [provider] Taking Active Self  docusate sodium (COLACE) 100 MG capsule [id number] Yes Take 1 capsule (100 mg total) by mouth daily.  Patient taking differently: Take 100 mg by mouth daily as needed for mild constipation.   [name], MD Taking Active            Med Note Sande Rives   Sat Jul 01, 2020  1:47 PM) Takes as needed  DULoxetine (CYMBALTA) 30 MG capsule [id number] Yes Take 30 mg by mouth every morning. [provider] Taking Active   feeding supplement, [place], ([place]) LIQD [id number] Yes Take 237 mLs by mouth 3 (three) times daily between meals. Mariel Aloe, MD Taking Active   furosemide (LASIX) 20 MG tablet [id number] Yes Take 1 tablet (20 mg total) by mouth daily as needed for fluid or edema (leg swelling). Arrien, Jimmy Picket, MD Taking Active Self  gabapentin (NEURONTIN) 300 MG capsule [id number] Yes Take 1 capsule (300 mg total) by mouth 2  (two) times daily. [name], MD Taking Active Self  glipiZIDE (GLUCOTROL) 5 MG tablet [id number] Yes TAKE 1 TABLET (5 MG TOTAL) BY MOUTH 2 (TWO) TIMES DAILY BEFORE A MEAL. [name], MD Taking Active   glucose blood [facility] ULTRA) test strip [id number] Yes Use as instructed to check blood sugar 2 times daily as needed [name], MD Taking Active   insulin glargine (LANTUS SOLOSTAR) 100 UNIT/ML Solostar Pen [id number] No Inject 10 Units into the skin daily.  Patient not taking: No sig reported   [name], MD Not Taking Active   isosorbide mononitrate (IMDUR) 30 MG 24 hr tablet [id number] Yes Take 0.5 tablets (15 mg total) by mouth daily. Sueanne Margarita, MD Taking Active Self  latanoprost (XALATAN) 0.005 % ophthalmic solution [id number] Yes Place 1 drop into both eyes at bedtime.  [provider] Taking Active Self  metFORMIN (GLUCOPHAGE-XR) 500 MG 24 hr tablet [id number] Yes Take 1,000 mg by mouth every morning. [provider] Taking Active   metoCLOPramide (REGLAN) 10 MG tablet [id number] Yes Take 1 tablet (10 mg total) by mouth 4 (four) times daily -  before meals and at bedtime. Mariel Aloe, MD Taking Active   metoprolol succinate (TOPROL XL) 25 MG 24 hr tablet [id number] Yes Take 1 tablet (25 mg total) by mouth daily. Mariel Aloe, MD Taking Active   nitroGLYCERIN (NITROSTAT) 0.4 MG SL tablet [id number] No Place 1 tablet (0.4 mg total) under the tongue every 5 (five) minutes x 3 doses as needed for chest pain.  Patient not taking: No sig reported   Margie Billet, NP Not Taking Active   ondansetron (ZOFRAN-ODT) 4 MG disintegrating tablet [id number] Yes Take 1 tablet (4 mg total) by mouth every 8 (eight) hours as needed for nausea or vomiting. Mariel Aloe, MD Taking Active   OneTouch Delica Lancets 96G MISC [id number] No Use as directed to check blood sugar 2 times daily as needed  Patient not taking: No sig reported   [name], MD Not  Taking Active   pantoprazole (PROTONIX) 40 MG tablet [id number] Yes Take 1 tablet (40 mg total) by mouth 2 (two) times daily.  Patient taking differently: Take 40 mg by mouth 2 (two) times daily.   [name], MD Taking Active   polyethylene glycol (MIRALAX / GLYCOLAX) 17 g packet [id number] Yes Take 17 g by mouth daily as needed for moderate constipation. Arrien, Jimmy Picket, MD Taking Active Self            Patient Active Problem List   Diagnosis Date Noted   Abdominal pain 07/31/2020   Functional abdominal pain syndrome 07/29/2020   Intractable nausea and vomiting 07/29/2020   Intractable vomiting    Diarrhea    Lower abdominal pain    Acute cystitis 07/10/2020   Atypical chest pain 07/04/2020   CAD (coronary artery disease) 07/04/2020   Low-tension glaucoma, bilateral, severe stage 03/24/2020   Dysuria 11/08/2019   Right sided temporal headache 08/06/2019   Atrial flutter with rapid ventricular response ([name])    Acute deep vein thrombosis (DVT) of left tibial vein ([place]) 07/11/2019   Acute cholecystitis 07/07/2019   Pain of right breast 03/15/2019   Health maintenance examination 12/11/2018   Type 2 diabetes mellitus with severe nonproliferative diabetic retinopathy with macular edema, bilateral ([place]) 07/16/2018   Coronary artery disease of native artery of native heart with stable angina pectoris ([name])    Effort angina ([place]) 11/12/2017   Abnormal stress ECG    Dyspnea on exertion 11/04/2017   GERD (gastroesophageal  reflux disease) 08/15/2017   Trigger finger 06/09/2017   Nocturnal leg movements 03/25/2017   Osteopenia 02/01/2017   Advanced care planning/counseling discussion 01/02/2017   Insomnia 08/26/2016   Vitamin B12 deficiency 11/10/2015   General weakness 08/04/2015   Epistaxis 07/07/2015   Fatty liver disease, nonalcoholic 56/38/9373   Chronic diastolic CHF (congestive heart failure) ([place]) 08/27/2014   Positive hepatitis C antibody test 08/27/2014    Iron deficiency 08/27/2014   NSVT (nonsustained ventricular tachycardia) ([name])    Nausea without vomiting    Autonomic orthostatic hypotension 07/11/2014   Lower urinary tract infectious disease 07/08/2014   S/P CABG x 3 07/08/2014   History of cerebrovascular accident (CVA) in adulthood 07/08/2014   Hypokalemia 03/23/2014   DM (diabetes mellitus), type 2, uncontrolled w/neurologic complication ([place]) 42/87/6811   Itching 03/16/2012   Rash 03/16/2012   Medicare annual wellness visit, initial 10/28/2011   Hyperlipidemia associated with type 2 diabetes mellitus ([place])    Idiopathic angioedema 06/23/2011   Vitiligo 06/23/2011   Dermatitis 06/23/2011   MDD (major depressive disorder), recurrent severe, without psychosis ([place]) 05/08/2006   Hypertension, essential 05/08/2006   MENOPAUSAL SYNDROME 05/08/2006    Immunization History  Administered Date(s) Administered   Fluad Quad(high Dose 65+) 12/11/2018   Influenza Split 12/17/2011   Influenza Whole 01/22/2013   Influenza,inj,Quad PF,6+ Mos 11/10/2015, 12/26/2016, 11/26/2017   Moderna Sars-Covid-2 Vaccination 03/21/2020   Pneumococcal Conjugate-13 12/26/2016   Pneumococcal-Unspecified 11/30/2010   Td 08/10/2003    Conditions to be addressed/monitored:  Diabetes and Depression  Care Plan : [place]  Updates made by [name], [place] since 10/17/2020 12:00 AM     Problem: CHL AMB "PATIENT-SPECIFIC PROBLEM"      Long-Range Goal: Disease Management   Start Date: 05/03/2020  Priority: High  Note:    Current Barriers:  Unable to achieve control of diabetes  Unable to achieve control of depression  Pharmacist Clinical Goal(s):  Patient will achieve adherence to monitoring guidelines and medication adherence to achieve therapeutic efficacy through collaboration with PharmD and provider.   Interventions: 1:1 collaboration with [name], MD regarding development and update of comprehensive plan of care as  evidenced by provider attestation and co-signature Inter-disciplinary care team collaboration (see longitudinal plan of care) Comprehensive medication review performed; medication list updated in electronic medical record  Diabetes (A1c goal <7%) -Uncontrolled - (A1c 11.6% 06/2020) Non compliant with insulin, BG checks, or PCP follow up. She does affirm taking her oral medications as prescribed. -Current medications: Farxiga 5 mg - 1 tablet daily Metformin 500 mg ER - 2 tablets every morning Glipizide 5 mg - 1 tablet BID  Lanuts - 10 units daily -Medications previously tried: Ozempic (never started) -Current home glucose readings - misplaced meter, no motivation to check BG -Current meal patterns: poor appetite, only eating once/day -Encouraged self blood glucose monitoring. I believe depression is primary factor for her lack of self care, non compliance to insulin. She agreed to schedule a PCP visit to discuss depression. -Continue current medications as unable to assess BG control without recent A1c or home readings.  Depression/Anxiety (Goal: Improve symptoms) -Uncontrolled  - She has had good and bad days, very depressed. Knows she needs to see Dr. [name]. Reports "Just existing, not doing much of anything". Does not drive, does not go in store. No interest in hobbies. Not walking or doing any activity. Poor appetite, no taste. Eating about 1 meal/day. Does not feel like she is going to harm herself or  others. Multiple falls in the yard per [place]. -History of auditory hallucinations  -Current treatment: Duloxetine 30 mg - 1 tablet daily -Medications previously tried/failed: sertraline 2015 (75 mg) --> changed to duloxetine 30, up to 60 mg (2017-2018) --> changed to citalopram 2018 - unknown efficacy. There is note in chart that duloxetine was not effective and thus changed to citalopram. But then later placed back on duloxetine after a psych admission along with trazodone and Geodon. -PHQ9:  4  (06/06/20)  -Declines interest in CBT in the past. Reports this was not effective. -Recommend follow up with PCP for labs and mood evaluation.  Patient Goals/Self-Care Activities Patient will:  - take medications as prescribed - follow up with health care providers   Follow Up Plan: Next PCP appointment scheduled for:  November 06, 2020 3:30 PM      Compliance/Adherence/Medication fill history: Care Gaps: Shingrix, COVID-19 vaccine, Flu vaccine Mammogram  Star-Rating Drugs: No gaps in adherence   Medication Assistance: None required.  Patient affirms current coverage meets needs.   Patient's preferred pharmacy is: [facility] - [place], [place] - [address] [place] [address] Phone: [phone] Fax: [phone]   Uses pill box? Yes Pt endorses 100% compliance - except insulin (not taking)  Care Plan and Follow Up Patient Decision:  Patient agrees to Care Plan and Follow-up.  [name], PharmD Clinical Pharmacist [facility] at [facility] [phone]

## 2020-10-17 NOTE — Patient Instructions (Signed)
Dear Maria Fox,  Below is a summary of the goals we discussed during our follow up appointment on October 17, 2020. Please contact me anytime with questions or concerns.   Visit Information  Patient Care Plan: CCM Pharmacy Care Plan     Problem Identified: CHL AMB "PATIENT-SPECIFIC PROBLEM"      Long-Range Goal: Disease Management   Start Date: 05/03/2020  Priority: High  Note:    Current Barriers:  Unable to achieve control of diabetes  Unable to achieve control of depression  Pharmacist Clinical Goal(s):  Patient will achieve adherence to monitoring guidelines and medication adherence to achieve therapeutic efficacy through collaboration with PharmD and provider.   Interventions: 1:1 collaboration with [name], MD regarding development and update of comprehensive plan of care as evidenced by provider attestation and co-signature Inter-disciplinary care team collaboration (see longitudinal plan of care) Comprehensive medication review performed; medication list updated in electronic medical record  Diabetes (A1c goal <7%) -Uncontrolled - (A1c 11.6% 06/2020) Non compliant with insulin, BG checks, or PCP follow up. She does affirm taking her oral medications as prescribed. -Current medications: Farxiga 5 mg - 1 tablet daily Metformin 500 mg ER - 2 tablets every morning Glipizide 5 mg - 1 tablet BID  Lanuts - 10 units daily -Medications previously tried: Ozempic (never started) -Current home glucose readings - misplaced meter, no motivation to check BG -Current meal patterns: poor appetite, only eating once/day -Encouraged self blood glucose monitoring. I believe depression is primary factor for her lack of self care, non compliance to insulin. She agreed to schedule a PCP visit to discuss depression. -Continue current medications as unable to assess BG control without recent A1c or home readings.  Depression/Anxiety (Goal: Improve symptoms) -Uncontrolled  - She has  had good and bad days, very depressed. Knows she needs to see Dr. [name]. Reports "Just existing, not doing much of anything". Does not drive, does not go in store. No interest in hobbies. Not walking or doing any activity. Poor appetite, no taste. Eating about 1 meal/day. Does not feel like she is going to harm herself or others. Multiple falls in the yard per [place]. -History of auditory hallucinations  -Current treatment: Duloxetine 30 mg - 1 tablet daily -Medications previously tried/failed: sertraline 2015 (75 mg) --> changed to duloxetine 30, up to 60 mg (2017-2018) --> changed to citalopram 2018 - unknown efficacy. There is note in chart that duloxetine was not effective and thus changed to citalopram. But then later placed back on duloxetine after a psych admission along with trazodone and Geodon. -PHQ9:  4 (06/06/20)  -Declines interest in CBT in the past. Reports this was not effective. -Recommend follow up with PCP for labs and mood evaluation.  Patient Goals/Self-Care Activities Patient will:  - take medications as prescribed - follow up with health care providers   Follow Up Plan: Next PCP appointment scheduled for:  November 06, 2020 3:30 PM     CMA follow up in 30 days for adherence review  The patient verbalized understanding of instructions, educational materials, and care plan provided today and declined offer to receive copy of patient instructions, educational materials, and care plan.   [name], PharmD Clinical Pharmacist [facility] at [facility] [phone]

## 2020-10-19 ENCOUNTER — Other Ambulatory Visit: Payer: Self-pay | Admitting: Family Medicine

## 2020-10-19 DIAGNOSIS — E785 Hyperlipidemia, unspecified: Secondary | ICD-10-CM

## 2020-10-19 NOTE — Telephone Encounter (Signed)
E-scribed refill 

## 2020-10-24 ENCOUNTER — Telehealth: Payer: Self-pay
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
## 2020-10-31 NOTE — Progress Notes (Addendum)
Refill request sent to Dr. Bosie Clos clinic for metoprolol. Last prescribed by hospitalist in May 2022. Pt has been taking consistently since then.   [name], CPP notified  Margaretmary Dys, [place] Pharmacy Assistant [phone]

## 2020-11-06 ENCOUNTER — Ambulatory Visit: Payer: Medicare Other | Admitting: Family Medicine

## 2020-11-08 ENCOUNTER — Ambulatory Visit: Payer: Medicare Other | Admitting: Family Medicine

## 2020-11-16 ENCOUNTER — Telehealth: Payer: Self-pay

## 2020-11-16 MED ORDER — METOPROLOL SUCCINATE ER 25 MG PO TB24: 25.0000 mg | ORAL_TABLET | Freq: Every day | ORAL | 2 refills | Status: DC
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
## 2020-11-16 NOTE — Telephone Encounter (Signed)
E-scribed refill 

## 2020-11-16 NOTE — Telephone Encounter (Signed)
-----   Message from [place] sent at 10/31/2020 10:01 AM EDT ----- Regarding: Refill Patient needs refill on Metoprolol. She last received this from her hospital stay. If appropriate will you please send to [facility] pharmacy?   Thank you,  Margaretmary Dys, [place] Pharmacy Assistant [phone]

## 2020-11-21 ENCOUNTER — Ambulatory Visit: Payer: Medicare Other | Admitting: Podiatry

## 2020-11-23 ENCOUNTER — Telehealth: Payer: Self-pay
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
## 2020-12-18 ENCOUNTER — Other Ambulatory Visit: Payer: Self-pay | Admitting: Family Medicine

## 2020-12-18 ENCOUNTER — Other Ambulatory Visit: Payer: Self-pay | Admitting: Cardiology

## 2020-12-18 DIAGNOSIS — E785 Hyperlipidemia, unspecified: Secondary | ICD-10-CM

## 2020-12-20 NOTE — Telephone Encounter (Signed)
Plz schedule pt for follow up OV first.  Then return message to me to refill meds.

## 2020-12-20 NOTE — Telephone Encounter (Signed)
Called patient and left vm.

## 2020-12-21 ENCOUNTER — Telehealth: Payer: Self-pay

## 2020-12-21 NOTE — Telephone Encounter (Signed)
Pt stated that she will call the office to schedule a f/u at another time

## 2020-12-21 NOTE — Chronic Care Management (AMB) (Addendum)
Chronic Care Management Pharmacy Assistant   Name: Maria Fox  MRN: [id number] DOB: [birth year]  Reason for Encounter: Medication Adherence and Delivery Coordination  Recent office visits:  None since last CCM contact  Recent consult visits:  None since last CCM contact  Hospital visits:  None in previous 6 months  Medications: Outpatient Encounter Medications as of 12/21/2020  Medication Sig Note   acetaminophen (TYLENOL) 500 MG tablet Take 500 mg by mouth as needed for moderate pain. (Patient not taking: No sig reported)    albuterol (PROAIR HFA) 108 (90 Base) MCG/ACT inhaler Inhale 2 puffs into the lungs every 6 (six) hours as needed for wheezing or shortness of breath. (Patient not taking: No sig reported)    Aloe Vera 72 % CREA Apply to burned areas four times daily. (Patient not taking: Reported on 10/17/2020)    apixaban (ELIQUIS) 5 MG TABS tablet Take 1 tablet (5 mg total) by mouth 2 (two) times daily.    atorvastatin (LIPITOR) 40 MG tablet TAKE ONE TABLET BY MOUTH EVERY EVENING    Blood Glucose Monitoring Suppl (ONE TOUCH ULTRA 2) w/Device KIT Use as instructed to check blood sugar 2 times daily as needed. (Patient not taking: No sig reported)    clopidogrel (PLAVIX) 75 MG tablet Take 1 tablet (75 mg total) by mouth every morning. Please make yearly appt with Dr. Radford Pax for November 2022 for future refills. Thank you 1st attempt    Cyanocobalamin (VITAMIN B12) 500 MCG TABS Take 2 tablets by mouth daily. (Patient not taking: Reported on 09/28/2020)    dapagliflozin propanediol (FARXIGA) 5 MG TABS tablet Take 5 mg by mouth daily.    docusate sodium (COLACE) 100 MG capsule Take 1 capsule (100 mg total) by mouth daily. (Patient taking differently: Take 100 mg by mouth daily as needed for mild constipation.) 07/01/2020: Takes as needed   DULoxetine (CYMBALTA) 30 MG capsule Take 30 mg by mouth every morning.    feeding supplement, GLUCERNA SHAKE, (GLUCERNA SHAKE) LIQD Take 237 mLs  by mouth 3 (three) times daily between meals.    furosemide (LASIX) 20 MG tablet Take 1 tablet (20 mg total) by mouth daily as needed for fluid or edema (leg swelling).    gabapentin (NEURONTIN) 300 MG capsule Take 1 capsule (300 mg total) by mouth 2 (two) times daily.    glipiZIDE (GLUCOTROL) 5 MG tablet TAKE 1 TABLET (5 MG TOTAL) BY MOUTH 2 (TWO) TIMES DAILY BEFORE A MEAL.    glucose blood (ONETOUCH ULTRA) test strip Use as instructed to check blood sugar 2 times daily as needed    insulin glargine (LANTUS SOLOSTAR) 100 UNIT/ML Solostar Pen Inject 10 Units into the skin daily. (Patient not taking: No sig reported)    isosorbide mononitrate (IMDUR) 30 MG 24 hr tablet Take 0.5 tablets (15 mg total) by mouth every morning. Please make yearly appt with Dr. Radford Pax for November 2022 for future refills. Thank you 1st attempt    latanoprost (XALATAN) 0.005 % ophthalmic solution Place 1 drop into both eyes at bedtime.     metFORMIN (GLUCOPHAGE-XR) 500 MG 24 hr tablet Take 1,000 mg by mouth every morning.    metoCLOPramide (REGLAN) 10 MG tablet Take 1 tablet (10 mg total) by mouth 4 (four) times daily -  before meals and at bedtime.    metoprolol succinate (TOPROL XL) 25 MG 24 hr tablet Take 1 tablet (25 mg total) by mouth daily.    nitroGLYCERIN (NITROSTAT) 0.4 MG SL  tablet Place 1 tablet (0.4 mg total) under the tongue every 5 (five) minutes x 3 doses as needed for chest pain. (Patient not taking: No sig reported)    ondansetron (ZOFRAN-ODT) 4 MG disintegrating tablet Take 1 tablet (4 mg total) by mouth every 8 (eight) hours as needed for nausea or vomiting.    OneTouch Delica Lancets 41D MISC Use as directed to check blood sugar 2 times daily as needed (Patient not taking: No sig reported)    pantoprazole (PROTONIX) 40 MG tablet Take 1 tablet (40 mg total) by mouth 2 (two) times daily. (Patient taking differently: Take 40 mg by mouth 2 (two) times daily.)    polyethylene glycol (MIRALAX / GLYCOLAX) 17 g  packet Take 17 g by mouth daily as needed for moderate constipation.    No facility-administered encounter medications on file as of 12/21/2020.   BP Readings from Last 3 Encounters:  09/09/20 130/65  08/05/20 133/78  07/27/20 138/82    Lab Results  Component Value Date   HGBA1C 11.6 (H) 07/03/2020      No OVs, Consults, or hospital visits since last care coordination call / Pharmacist visit. No medication changes indicated   Last adherence delivery date:12/04/20      Patient is due for next adherence delivery on: 01/02/21  Multiple attempts made to reach patient. Unsuccessful outreach. Will refill based off of last adherence fill.   This delivery to include: Adherence Packaging  30 Days  Packs: Farxiga 5 mg - 1 tablet daily (1 breakfast) Pantoprazole 40 mg - 1 tablet twice daily (1 breakfast, 1 evening meal) Gabapentin 300 mg- 1 tablet twice daily (1 breakfast, 1 evening meal) Duloxetine 30 mg - 1 capsule daily (1 breakfast) Atorvastatin 40 mg - 1 tablet daily (1 evening meal) Isosorbide Mononitrate 30 mg - 1/2 tablet daily (1/2 breakfast) Clopidogrel 75 mg - 1 tablet daily (1 breakfast) Glipizide 5 mg - 1 tablet twice daily (1 breakfast, 1 evening meal) Metformin 500 mg ER - 2 tablets at breakfast (2 breakfast) Eliquis 5 mg- 1 tablet twice daily (1 breakfast, 1 evening meal) Metoprolol succinate 25 mg ER - 1 tablet daily at breakfast (1 breakfast)   VIAL medications: Latanoprost 0.005 % eye drops - 1 drop in each eye daily Furosemide 20 mg - 1 tablet daily (breakfast) as needed   Annual wellness visit in last year? Yes   Most Recent BP reading: 123/69  69-P   09/09/20  If Diabetic: Most recent A1C reading:  11.6  07/03/20 Last eye exam / retinopathy screening: 03/23/20 Last diabetic foot exam: 11/07/20  [name], CPP notified  Avel Sensor, [place] Assistant [phone]  I have reviewed the care management and care coordination activities  outlined in this encounter and I am certifying that I agree with the content of this note. No further action required.  [name], PharmD Clinical Pharmacist [facility] at [facility] [phone]

## 2020-12-22 NOTE — Telephone Encounter (Signed)
Left message on voicemail for patient to call the office back. See note from [place] patient needs to schedule a follow-up.

## 2020-12-22 NOTE — Telephone Encounter (Signed)
Eliquis last filled 09/29/20, #184 Metformin ER last filled 09/29/20, #180 Glipizide last filled 09/29/20, #180 Last OV:  06/13/20, AWV prt 2 Next OV:  none

## 2020-12-28 NOTE — Progress Notes (Addendum)
Patient returned phone call stating her telephone was out for a few days. We reviewed her medications and made sure nothing will be left out in delivery 01/02/21  [name], CPP notified  Avel Sensor, [place] Assistant [phone]  Total time spent for month CPA: 10 mins

## 2021-01-19 ENCOUNTER — Telehealth: Payer: Self-pay

## 2021-01-19 NOTE — Chronic Care Management (AMB) (Addendum)
Chronic Care Management Pharmacy Assistant   Name: Maria Fox  MRN: [id number] DOB: 1945/07/12  Reason for Encounter: Medication Adherence and Delivery Coordination   Recent office visits:  None since last CCM contact  Recent consult visits:  None since last CCM contact  Hospital visits:  None in previous 6 months  Medications: Outpatient Encounter Medications as of 01/19/2021  Medication Sig Note   acetaminophen (TYLENOL) 500 MG tablet Take 500 mg by mouth as needed for moderate pain. (Patient not taking: No sig reported)    albuterol (PROAIR HFA) 108 (90 Base) MCG/ACT inhaler Inhale 2 puffs into the lungs every 6 (six) hours as needed for wheezing or shortness of breath. (Patient not taking: No sig reported)    Aloe Vera 72 % CREA Apply to burned areas four times daily. (Patient not taking: Reported on 10/17/2020)    atorvastatin (LIPITOR) 40 MG tablet TAKE ONE TABLET BY MOUTH EVERY EVENING    Blood Glucose Monitoring Suppl (ONE TOUCH ULTRA 2) w/Device KIT Use as instructed to check blood sugar 2 times daily as needed. (Patient not taking: No sig reported)    clopidogrel (PLAVIX) 75 MG tablet Take 1 tablet (75 mg total) by mouth every morning. Please make yearly appt with Dr. Mayford Knife for November 2022 for future refills. Thank you 1st attempt    Cyanocobalamin (VITAMIN B12) 500 MCG TABS Take 2 tablets by mouth daily. (Patient not taking: Reported on 09/28/2020)    dapagliflozin propanediol (FARXIGA) 5 MG TABS tablet Take 5 mg by mouth daily.    docusate sodium (COLACE) 100 MG capsule Take 1 capsule (100 mg total) by mouth daily. (Patient taking differently: Take 100 mg by mouth daily as needed for mild constipation.) 07/01/2020: Takes as needed   DULoxetine (CYMBALTA) 30 MG capsule Take 30 mg by mouth every morning.    ELIQUIS 5 MG TABS tablet TAKE ONE TABLET BY MOUTH TWICE DAILY    feeding supplement, GLUCERNA SHAKE, (GLUCERNA SHAKE) LIQD Take 237 mLs by mouth 3 (three) times daily  between meals.    furosemide (LASIX) 20 MG tablet Take 1 tablet (20 mg total) by mouth daily as needed for fluid or edema (leg swelling).    gabapentin (NEURONTIN) 300 MG capsule Take 1 capsule (300 mg total) by mouth 2 (two) times daily.    glipiZIDE (GLUCOTROL) 5 MG tablet TAKE ONE TABLET BY MOUTH TWICE DAILY    glucose blood (ONETOUCH ULTRA) test strip Use as instructed to check blood sugar 2 times daily as needed    insulin glargine (LANTUS SOLOSTAR) 100 UNIT/ML Solostar Pen Inject 10 Units into the skin daily. (Patient not taking: No sig reported)    isosorbide mononitrate (IMDUR) 30 MG 24 hr tablet Take 0.5 tablets (15 mg total) by mouth every morning. Please make yearly appt with Dr. Mayford Knife for November 2022 for future refills. Thank you 1st attempt    latanoprost (XALATAN) 0.005 % ophthalmic solution Place 1 drop into both eyes at bedtime.     metFORMIN (GLUCOPHAGE-XR) 500 MG 24 hr tablet TAKE TWO TABLETS BY MOUTH EVERY MORNING    metoCLOPramide (REGLAN) 10 MG tablet Take 1 tablet (10 mg total) by mouth 4 (four) times daily -  before meals and at bedtime.    metoprolol succinate (TOPROL XL) 25 MG 24 hr tablet Take 1 tablet (25 mg total) by mouth daily.    nitroGLYCERIN (NITROSTAT) 0.4 MG SL tablet Place 1 tablet (0.4 mg total) under the tongue every 5 (five)  minutes x 3 doses as needed for chest pain. (Patient not taking: No sig reported)    ondansetron (ZOFRAN-ODT) 4 MG disintegrating tablet Take 1 tablet (4 mg total) by mouth every 8 (eight) hours as needed for nausea or vomiting.    OneTouch Delica Lancets 22W MISC Use as directed to check blood sugar 2 times daily as needed (Patient not taking: No sig reported)    pantoprazole (PROTONIX) 40 MG tablet Take 1 tablet (40 mg total) by mouth 2 (two) times daily. (Patient taking differently: Take 40 mg by mouth 2 (two) times daily.)    polyethylene glycol (MIRALAX / GLYCOLAX) 17 g packet Take 17 g by mouth daily as needed for moderate  constipation.    No facility-administered encounter medications on file as of 01/19/2021.    BP Readings from Last 3 Encounters:  09/09/20 130/65  08/05/20 133/78  07/27/20 138/82    Lab Results  Component Value Date   HGBA1C 11.6 (H) 07/03/2020      No OVs, Consults, or hospital visits since last care coordination call / Pharmacist visit. No medication changes indicated   Last adherence delivery date:01/02/21      Patient is due for next adherence delivery on: 01/31/21  Spoke with patient on 01/23/21 reviewed medications and coordinated delivery.  This delivery to include: Adherence Packaging  30 Days  Packs: Farxiga 5 mg - 1 tablet daily (1 breakfast) Pantoprazole 40 mg - 1 tablet twice daily (1 breakfast, 1 evening meal) Gabapentin 300 mg- 1 tablet twice daily (1 breakfast, 1 evening meal) Duloxetine 30 mg - 1 capsule daily (1 breakfast) Atorvastatin 40 mg - 1 tablet daily (1 evening meal) Isosorbide Mononitrate 30 mg - 1/2 tablet daily (1/2 breakfast) Clopidogrel 75 mg - 1 tablet daily (1 breakfast) Glipizide 5 mg - 1 tablet twice daily (1 breakfast, 1 evening meal) Metformin 500 mg ER - 2 tablets ( breakfast) Eliquis 5 mg- 1 tablet twice daily (1 breakfast, 1 evening meal) Metoprolol succinate 25 mg ER - 1 tablet daily  (breakfast)   VIAL medications: Latanoprost 0.005 % eye drops - 1 drop in each eye daily Furosemide 20 mg - 1 tablet daily (breakfast) as needed    Patient declined the following medications this month: none   Any concerns about your medications? No  How often do you forget or accidentally miss a dose? Never  Do you use a pillbox? No  Is patient in packaging Yes  What is the date on your next pill pack? She is not  near medications to read package  Any concerns or issues with your packaging? She likes the package system   No refill request needed.  Confirmed delivery date of 01/31/21, advised patient that pharmacy will contact them  the morning of delivery.  Recent blood pressure readings are as follows:none available at this time    Recent blood glucose readings are as follows:none available at this time    Annual wellness visit in last year? Yes Most Recent BP reading:123/69  69-P 09/09/20  If Diabetic: Most recent A1C reading: 11.6  (07/03/20) Last eye exam / retinopathy screening: 03/23/20 Last diabetic foot exam: 11/07/20  The patient does has follow up with PCP 01/26/21   [name], CPP notified  Avel Sensor, [place] Assistant [phone]  I have reviewed the care management and care coordination activities outlined in this encounter and I am certifying that I agree with the content of this note. No further action required.  Sharyn Lull  Andree Elk, PharmD Clinical Pharmacist [facility] at [facility] [phone]

## 2021-01-26 ENCOUNTER — Other Ambulatory Visit: Payer: Self-pay

## 2021-01-26 ENCOUNTER — Encounter: Payer: Self-pay | Admitting: Family Medicine

## 2021-01-26 ENCOUNTER — Ambulatory Visit (INDEPENDENT_AMBULATORY_CARE_PROVIDER_SITE_OTHER): Payer: Medicare Other | Admitting: Family Medicine

## 2021-01-26 VITALS — BP 138/74 | HR 68 | Temp 97.6°F | Ht 63.0 in | Wt 137.4 lb

## 2021-01-26 DIAGNOSIS — I10 Essential (primary) hypertension: Secondary | ICD-10-CM | POA: Diagnosis not present

## 2021-01-26 DIAGNOSIS — E113413 Type 2 diabetes mellitus with severe nonproliferative diabetic retinopathy with macular edema, bilateral: Secondary | ICD-10-CM | POA: Diagnosis not present

## 2021-01-26 DIAGNOSIS — E1149 Type 2 diabetes mellitus with other diabetic neurological complication: Secondary | ICD-10-CM | POA: Diagnosis not present

## 2021-01-26 DIAGNOSIS — Z23 Encounter for immunization: Secondary | ICD-10-CM

## 2021-01-26 DIAGNOSIS — E538 Deficiency of other specified B group vitamins: Secondary | ICD-10-CM

## 2021-01-26 DIAGNOSIS — F332 Major depressive disorder, recurrent severe without psychotic features: Secondary | ICD-10-CM

## 2021-01-26 LAB — POCT GLYCOSYLATED HEMOGLOBIN (HGB A1C): Hemoglobin A1C: 8.3 % — AB (ref 4.0–5.6)

## 2021-01-26 MED ORDER — METOPROLOL SUCCINATE ER 25 MG PO TB24
25.0000 mg | ORAL_TABLET | Freq: Every day | ORAL | 1 refills | Status: DC
Start: 2021-01-26 — End: 2021-08-16

## 2021-01-26 MED ORDER — PANTOPRAZOLE SODIUM 40 MG PO TBEC: 40.0000 mg | DELAYED_RELEASE_TABLET | Freq: Two times a day (BID) | ORAL | 1 refills | Status: DC

## 2021-01-26 MED ORDER — GABAPENTIN 300 MG PO CAPS: 300.0000 mg | ORAL_CAPSULE | Freq: Two times a day (BID) | ORAL | 1 refills | Status: DC

## 2021-01-26 MED ORDER — LANTUS SOLOSTAR 100 UNIT/ML ~~LOC~~ SOPN: 10.0000 [IU] | PEN_INJECTOR | Freq: Every day | SUBCUTANEOUS | 2 refills | Status: DC

## 2021-01-26 NOTE — Assessment & Plan Note (Signed)
Chronic, improvement noted. Anticipate receiving pill packs has significantly improved compliance. She is motivated to continue medications as up to now. Encouraged she restart daily lantus injections, discussed monitoring for hypoglycemia and if that develops to hold glipizide and let us know.

## 2021-01-26 NOTE — Addendum Note (Signed)
Addended by: [name] on: 01/26/2021 04:31 PM   Modules accepted: Orders

## 2021-01-26 NOTE — Assessment & Plan Note (Signed)
Chronic, stable on current regimen - continue. 

## 2021-01-26 NOTE — Progress Notes (Addendum)
Patient ID: Maria Fox, female    DOB: 10-Jul-1945, 75 y.o.   MRN: [id number]  This visit was conducted in person.  BP 138/74   Pulse 68   Temp 97.6 F (36.4 C) (Temporal)   Ht '5\' 3"'  (1.6 m)   Wt 137 lb 7 oz (62.3 kg)   SpO2 97%   BMI 24.35 kg/m    CC: diabetes follow up visit  Subjective:   HPI: Maria Fox is a 75 y.o. female presenting on 01/26/2021 for Diabetes (Here for f/u.)   Last seen 06/2020.   Off B12 for about 6 months.   DM - does not regularly check sugars. Compliant with antihyperglycemic regimen which includes: farxiga 41m daily, glipizide 542mbid, lantus 10u daily, metformin XR 100030mn am. She's not been doing lantus shots. Intermittent glucerna shake. Denies hypoglycemic symptoms. Denies paresthesias, blurry vision. Last diabetic eye exam 03/2020. Glucometer brand: one touch ultra. Last foot exam: DUE. DSME: declines.  Lab Results  Component Value Date   HGBA1C 8.3 (A) 01/26/2021   Diabetic Foot Exam - Simple   Simple Foot Form Diabetic Foot exam was performed with the following findings: Yes 01/26/2021  4:01 PM  Visual Inspection No deformities, no ulcerations, no other skin breakdown bilaterally: Yes Sensation Testing Intact to touch and monofilament testing bilaterally: Yes Pulse Check Posterior Tibialis and Dorsalis pulse intact bilaterally: Yes Comments 2+ DP bilaterally    Lab Results  Component Value Date   MICROALBUR <0.7 05/02/2020        Relevant past medical, surgical, family and social history reviewed and updated as indicated. Interim medical history since our last visit reviewed. Allergies and medications reviewed and updated. Outpatient Medications Prior to Visit  Medication Sig Dispense Refill   acetaminophen (TYLENOL) 500 MG tablet Take 500 mg by mouth as needed for moderate pain.     albuterol (PROAIR HFA) 108 (90 Base) MCG/ACT inhaler Inhale 2 puffs into the lungs every 6 (six) hours as needed for wheezing or shortness of  breath. 18 g 0   Aloe Vera 72 % CREA Apply to burned areas four times daily. 114 g 0   atorvastatin (LIPITOR) 40 MG tablet TAKE ONE TABLET BY MOUTH EVERY EVENING 90 tablet 2   Blood Glucose Monitoring Suppl (ONE TOUCH ULTRA 2) w/Device KIT Use as instructed to check blood sugar 2 times daily as needed. 1 kit 0   clopidogrel (PLAVIX) 75 MG tablet Take 1 tablet (75 mg total) by mouth every morning. Please make yearly appt with Dr. TurRadford Paxr November 2022 for future refills. Thank you 1st attempt 90 tablet 0   Cyanocobalamin (VITAMIN B12) 500 MCG TABS Take 2 tablets by mouth daily.     dapagliflozin propanediol (FARXIGA) 5 MG TABS tablet Take 5 mg by mouth daily.     docusate sodium (COLACE) 100 MG capsule Take 1 capsule (100 mg total) by mouth daily. (Patient taking differently: Take 100 mg by mouth daily as needed for mild constipation.) 30 capsule 6   DULoxetine (CYMBALTA) 30 MG capsule Take 30 mg by mouth every morning.     ELIQUIS 5 MG TABS tablet TAKE ONE TABLET BY MOUTH TWICE DAILY 184 tablet 1   feeding supplement, GLUCERNA SHAKE, (GLUCERNA SHAKE) LIQD Take 237 mLs by mouth 3 (three) times daily between meals.  0   furosemide (LASIX) 20 MG tablet Take 1 tablet (20 mg total) by mouth daily as needed for fluid or edema (leg swelling). 30 tablet  glipiZIDE (GLUCOTROL) 5 MG tablet TAKE ONE TABLET BY MOUTH TWICE DAILY 180 tablet 1   glucose blood (ONETOUCH ULTRA) test strip Use as instructed to check blood sugar 2 times daily as needed 100 strip 3   isosorbide mononitrate (IMDUR) 30 MG 24 hr tablet Take 0.5 tablets (15 mg total) by mouth every morning. Please make yearly appt with Dr. Radford Pax for November 2022 for future refills. Thank you 1st attempt 45 tablet 0   latanoprost (XALATAN) 0.005 % ophthalmic solution Place 1 drop into both eyes at bedtime.      metFORMIN (GLUCOPHAGE-XR) 500 MG 24 hr tablet TAKE TWO TABLETS BY MOUTH EVERY MORNING 180 tablet 1   nitroGLYCERIN (NITROSTAT) 0.4 MG SL  tablet Place 1 tablet (0.4 mg total) under the tongue every 5 (five) minutes x 3 doses as needed for chest pain. 20 tablet 3   ondansetron (ZOFRAN-ODT) 4 MG disintegrating tablet Take 1 tablet (4 mg total) by mouth every 8 (eight) hours as needed for nausea or vomiting. 30 tablet 0   OneTouch Delica Lancets 33L MISC Use as directed to check blood sugar 2 times daily as needed 100 each 3   polyethylene glycol (MIRALAX / GLYCOLAX) 17 g packet Take 17 g by mouth daily as needed for moderate constipation. 14 each 0   gabapentin (NEURONTIN) 300 MG capsule Take 1 capsule (300 mg total) by mouth 2 (two) times daily. 60 capsule 11   insulin glargine (LANTUS SOLOSTAR) 100 UNIT/ML Solostar Pen Inject 10 Units into the skin daily. 15 mL 2   metoprolol succinate (TOPROL XL) 25 MG 24 hr tablet Take 1 tablet (25 mg total) by mouth daily. 30 tablet 2   pantoprazole (PROTONIX) 40 MG tablet Take 1 tablet (40 mg total) by mouth 2 (two) times daily. (Patient taking differently: Take 40 mg by mouth 2 (two) times daily.) 60 tablet 8   metoCLOPramide (REGLAN) 10 MG tablet Take 1 tablet (10 mg total) by mouth 4 (four) times daily -  before meals and at bedtime. 120 tablet 0   No facility-administered medications prior to visit.     Per HPI unless specifically indicated in ROS section below Review of Systems  Objective:  BP 138/74   Pulse 68   Temp 97.6 F (36.4 C) (Temporal)   Ht '5\' 3"'  (1.6 m)   Wt 137 lb 7 oz (62.3 kg)   SpO2 97%   BMI 24.35 kg/m   Wt Readings from Last 3 Encounters:  01/26/21 137 lb 7 oz (62.3 kg)  09/08/20 140 lb 3.4 oz (63.6 kg)  07/29/20 140 lb 3.4 oz (63.6 kg)      Physical Exam Vitals and nursing note reviewed.  Constitutional:      Appearance: Normal appearance. She is not ill-appearing.  Eyes:     Extraocular Movements: Extraocular movements intact.     Conjunctiva/sclera: Conjunctivae normal.     Pupils: Pupils are equal, round, and reactive to light.  Cardiovascular:      Rate and Rhythm: Normal rate and regular rhythm.     Pulses: Normal pulses.     Heart sounds: Normal heart sounds. No murmur heard. Pulmonary:     Effort: Pulmonary effort is normal. No respiratory distress.     Breath sounds: Normal breath sounds. No wheezing, rhonchi or rales.  Musculoskeletal:     Right lower leg: No edema.     Left lower leg: No edema.  Skin:    General: Skin is warm and dry.  Findings: No rash.     Comments: Vitiligo  Neurological:     Mental Status: She is alert.  Psychiatric:        Mood and Affect: Mood normal.        Behavior: Behavior normal.      Results for orders placed or performed in visit on 01/26/21  POCT glycosylated hemoglobin (Hb A1C)  Result Value Ref Range   Hemoglobin A1C 8.3 (A) 4.0 - 5.6 %   HbA1c POC (<> result, manual entry)     HbA1c, POC (prediabetic range)     HbA1c, POC (controlled diabetic range)      Assessment & Plan:  This visit occurred during the SARS-CoV-2 public health emergency.  Safety protocols were in place, including screening questions prior to the visit, additional usage of staff PPE, and extensive cleaning of exam room while observing appropriate contact time as indicated for disinfecting solutions.   Problem List Items Addressed This Visit     MDD (major depressive disorder), recurrent severe, without psychosis ([place])    Overall stable period on regular cymbalta - receiving daily pill packs through [facility] has significantly helped compliance      Hypertension, essential    Chronic, stable on current regimen - continue       Relevant Medications   metoprolol succinate (TOPROL XL) 25 MG 24 hr tablet   Type 2 diabetes mellitus with neurological complications ([name]) - Primary    Chronic, improvement noted. Anticipate receiving pill packs has significantly improved compliance. She is motivated to continue medications as up to now. Encouraged she restart daily lantus injections, discussed monitoring for  hypoglycemia and if that develops to hold glipizide and let us know.       Relevant Medications   insulin glargine (LANTUS SOLOSTAR) 100 UNIT/ML Solostar Pen   Vitamin B12 deficiency    Off oral B12 replacement for about 6 months.  Will update levels and see if ongoing need for daily replacement.       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    Dispense:  15 mL    Refill:  2   gabapentin (NEURONTIN) 300 MG capsule    Sig: Take 1 capsule (300 mg total) by mouth 2 (two) times daily.    Dispense:  180 capsule    Refill:  1   metoprolol succinate (TOPROL XL) 25 MG 24 hr tablet    Sig: Take 1 tablet (25 mg total) by mouth daily.    Dispense:  90 tablet    Refill:  1   pantoprazole (PROTONIX) 40 MG tablet    Sig: Take 1 tablet (40 mg total) by mouth 2 (two) times daily.    Dispense:  180 tablet    Refill:  1    Orders Placed This Encounter  Procedures   Flu Vaccine QUAD High Dose(Fluad)   Renal function panel   LDL  Cholesterol, Direct   Vitamin B12   POCT glycosylated hemoglobin (Hb A1C)     Patient Instructions  Labs today  Good to see you today Sugars are doing better! A1c was down to 8.3%.  I do think starting lantus would help optimize sugar control.  Start lantus 10 units daily, start checking sugars more regularly, if any lows <70, hold glipizide and let us know.  Return as needed or in 5 months for physical.    Follow up plan: Return in about 5 months (around 06/26/2021) for annual exam, prior fasting for blood work, medicare wellness visit.  [name], MD

## 2021-01-26 NOTE — Patient Instructions (Addendum)
Labs today  Good to see you today Sugars are doing better! A1c was down to 8.3%.  I do think starting lantus would help optimize sugar control.  Start lantus 10 units daily, start checking sugars more regularly, if any lows <70, hold glipizide and let us know.  Return as needed or in 5 months for physical.

## 2021-01-26 NOTE — Assessment & Plan Note (Signed)
Continues regular f/u with Dr Katy Fitch

## 2021-01-26 NOTE — Assessment & Plan Note (Signed)
Overall stable period on regular cymbalta - [facility] pill packs [facility] has significantly helped compliance

## 2021-01-26 NOTE — Assessment & Plan Note (Signed)
Off oral B12 replacement for about 6 months.  Will update levels and see if ongoing need for daily replacement.

## 2021-01-27 LAB — RENAL FUNCTION PANEL
Albumin: 3.9 g/dL (ref 3.6–5.1)
BUN/Creatinine Ratio: 10 (calc) (ref 6–22)
BUN: 14 mg/dL (ref 7–25)
CO2: 21 mmol/L (ref 20–32)
Calcium: 9.6 mg/dL (ref 8.6–10.4)
Chloride: 106 mmol/L (ref 98–110)
Creat: 1.45 mg/dL — ABNORMAL HIGH (ref 0.60–1.00)
Glucose, Bld: 236 mg/dL — ABNORMAL HIGH (ref 65–99)
Phosphorus: 3.8 mg/dL (ref 2.1–4.3)
Potassium: 4 mmol/L (ref 3.5–5.3)
Sodium: 141 mmol/L (ref 135–146)

## 2021-01-27 LAB — LDL CHOLESTEROL, DIRECT: Direct LDL: 60 mg/dL (ref <100)

## 2021-01-27 LAB — VITAMIN B12: Vitamin B-12: 397 pg/mL (ref 200–1100)

## 2021-01-30 ENCOUNTER — Telehealth: Payer: Self-pay | Admitting: Family Medicine

## 2021-01-30 NOTE — Telephone Encounter (Signed)
Mr. Maria Fox called in and wanted to know if Dr. [name] can write a script for nausea medication due to she is sick on the stomach, it started last night.

## 2021-01-31 MED ORDER — ONDANSETRON 4 MG PO TBDP
4.0000 mg | ORAL_TABLET | Freq: Three times a day (TID) | ORAL | 0 refills | Status: DC | PRN
Start: 2021-01-31 — End: 2021-05-08

## 2021-01-31 NOTE — Telephone Encounter (Signed)
Spoke with pt about nausea.  Denies vomiting or diarrhea.  She's requesting refill of Zofran sent to [facility]. Notified her refill was sent.  Pt expresses her thanks.   E-scribed refill.

## 2021-02-05 ENCOUNTER — Other Ambulatory Visit: Payer: Self-pay | Admitting: Family Medicine

## 2021-02-05 MED ORDER — VITAMIN B12 500 MCG PO TABS: 1.0000 | ORAL_TABLET | Freq: Every day | ORAL | Status: DC

## 2021-02-06 ENCOUNTER — Other Ambulatory Visit: Payer: Self-pay

## 2021-02-06 ENCOUNTER — Encounter (HOSPITAL_COMMUNITY): Payer: Self-pay

## 2021-02-06 ENCOUNTER — Ambulatory Visit (HOSPITAL_COMMUNITY)
Admission: EM | Admit: 2021-02-06 | Discharge: 2021-02-06 | Disposition: A | Discharge status: Home / Self Care | Payer: Medicare Other | Attending: Physician Assistant | Admitting: Physician Assistant

## 2021-02-06 DIAGNOSIS — N760 Acute vaginitis: Secondary | ICD-10-CM | POA: Diagnosis not present

## 2021-02-06 DIAGNOSIS — N764 Abscess of vulva: Secondary | ICD-10-CM | POA: Diagnosis not present

## 2021-02-06 MED ORDER — DOXYCYCLINE HYCLATE 100 MG PO CAPS: 100.0000 mg | ORAL_CAPSULE | Freq: Two times a day (BID) | ORAL | 0 refills | Status: DC

## 2021-02-06 NOTE — ED Triage Notes (Signed)
Pt present to the office for vaginal bump x 3 days.
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
## 2021-02-06 NOTE — Discharge Instructions (Signed)
We drained the abscess.  This area will likely continue to drain and placed some but if you have any worsening symptoms including if feels back up with fluid or you have pain you need to be seen again.  Follow-up with either our clinic or your primary care provider within a few days to ensure symptom improvement unless you need to be seen sooner.  Start antibiotics.  Use Tylenol for pain relief.  If you have any worsening symptoms please return for reevaluation as we discussed.  Keep area clean.

## 2021-02-20 ENCOUNTER — Telehealth: Payer: Self-pay

## 2021-02-20 NOTE — Chronic Care Management (AMB) (Addendum)
Chronic Care Management Pharmacy Assistant   Name: Maria Fox  MRN: [id number] DOB: Jan 04, 1946   Reason for Encounter: Medication Adherence and Delivery Coordination   Recent office visits:  01/26/21 - PCP - Patient presented for follow up diabetes. Labs ordered: Please notify sugar was too high at 236 - recommend restart lantus injections and if low sugars noted to hold glipizide. Kidney function was deteriorated - ensure staying well hydrated. Cholesterol levels were ok. Vitamin B12 was normal but levels have dropped from prior - recommend restart vit B12 555mg once daily.  Recent consult visits:  None since last CCM contact  [facility]  [facility] for abscess vagina. Started doxycycline 1070mtake 1 capsule 2 times daily for 10 days.   Medications: Outpatient Encounter Medications as of 02/20/2021  Medication Sig Note   acetaminophen (TYLENOL) 500 MG tablet Take 500 mg by mouth as needed for moderate pain.    albuterol (PROAIR HFA) 108 (90 Base) MCG/ACT inhaler Inhale 2 puffs into the lungs every 6 (six) hours as needed for wheezing or shortness of breath.    Aloe Vera 72 % CREA Apply to burned areas four times daily.    atorvastatin (LIPITOR) 40 MG tablet TAKE ONE TABLET BY MOUTH EVERY EVENING    Blood Glucose Monitoring Suppl (ONE TOUCH ULTRA 2) w/Device KIT Use as instructed to check blood sugar 2 times daily as needed.    clopidogrel (PLAVIX) 75 MG tablet Take 1 tablet (75 mg total) by mouth every morning. Please make yearly appt with Dr. TuRadford Paxor November 2022 for future refills. Thank you 1st attempt    Cyanocobalamin (VITAMIN B12) 500 MCG TABS Take 1 tablet by mouth daily.    dapagliflozin propanediol (FARXIGA) 5 MG TABS tablet Take 5 mg by mouth daily.    docusate sodium (COLACE) 100 MG capsule Take 1 capsule (100 mg total) by mouth daily. (Patient taking differently: Take 100 mg by mouth daily as needed for  mild constipation.) 07/01/2020: Takes as needed   doxycycline (VIBRAMYCIN) 100 MG capsule Take 1 capsule (100 mg total) by mouth 2 (two) times daily.    DULoxetine (CYMBALTA) 30 MG capsule Take 30 mg by mouth every morning.    ELIQUIS 5 MG TABS tablet TAKE ONE TABLET BY MOUTH TWICE DAILY    feeding supplement, GLUCERNA SHAKE, (GLUCERNA SHAKE) LIQD Take 237 mLs by mouth 3 (three) times daily between meals.    furosemide (LASIX) 20 MG tablet Take 1 tablet (20 mg total) by mouth daily as needed for fluid or edema (leg swelling).    gabapentin (NEURONTIN) 300 MG capsule Take 1 capsule (300 mg total) by mouth 2 (two) times daily.    glipiZIDE (GLUCOTROL) 5 MG tablet TAKE ONE TABLET BY MOUTH TWICE DAILY    glucose blood (ONETOUCH ULTRA) test strip Use as instructed to check blood sugar 2 times daily as needed    insulin glargine (LANTUS SOLOSTAR) 100 UNIT/ML Solostar Pen Inject 10 Units into the skin daily.    isosorbide mononitrate (IMDUR) 30 MG 24 hr tablet Take 0.5 tablets (15 mg total) by mouth every morning. Please make yearly appt with Dr. TuRadford Paxor November 2022 for future refills. Thank you 1st attempt    latanoprost (XALATAN) 0.005 % ophthalmic solution Place 1 drop into both eyes at bedtime.     metFORMIN (GLUCOPHAGE-XR) 500 MG 24 hr tablet TAKE TWO TABLETS BY MOUTH EVERY MORNING    metoCLOPramide (  REGLAN) 10 MG tablet Take 1 tablet (10 mg total) by mouth 4 (four) times daily -  before meals and at bedtime.    metoprolol succinate (TOPROL XL) 25 MG 24 hr tablet Take 1 tablet (25 mg total) by mouth daily.    nitroGLYCERIN (NITROSTAT) 0.4 MG SL tablet Place 1 tablet (0.4 mg total) under the tongue every 5 (five) minutes x 3 doses as needed for chest pain.    ondansetron (ZOFRAN-ODT) 4 MG disintegrating tablet Take 1 tablet (4 mg total) by mouth every 8 (eight) hours as needed for nausea or vomiting.    OneTouch Delica Lancets 16W MISC Use as directed to check blood sugar 2 times daily as needed     pantoprazole (PROTONIX) 40 MG tablet Take 1 tablet (40 mg total) by mouth 2 (two) times daily.    polyethylene glycol (MIRALAX / GLYCOLAX) 17 g packet Take 17 g by mouth daily as needed for moderate constipation.    No facility-administered encounter medications on file as of 02/20/2021.   BP Readings from Last 3 Encounters:  02/06/21 131/81  01/26/21 138/74  09/09/20 130/65    Lab Results  Component Value Date   HGBA1C 8.3 (A) 01/26/2021    New medications: vit B12 542mg once daily  Last adherence delivery date:01/31/21      Patient is due for next adherence delivery on: 03/02/21  Spoke with patient on 02/21/21 reviewed medications and coordinated delivery.  This delivery to include: Adherence Packaging  30 Days  Packs: Farxiga 5 mg - 1 tablet daily (1 breakfast) Pantoprazole 40 mg - 1 tablet twice daily (1 breakfast, 1 evening meal) Gabapentin 300 mg- 1 tablet twice daily (1 breakfast, 1 evening meal) Duloxetine 30 mg - 1 capsule daily (1 breakfast) Atorvastatin 40 mg - 1 tablet daily (1 evening meal) Isosorbide Mononitrate 30 mg - 1/2 tablet daily (1/2 breakfast) Clopidogrel 75 mg - 1 tablet daily (1 breakfast) Glipizide 5 mg - 1 tablet twice daily (1 breakfast, 1 evening meal) Metformin 500 mg ER - 2 tablets ( breakfast) Eliquis 5 mg- 1 tablet twice daily (1 breakfast, 1 evening meal) Metoprolol succinate 25 mg ER - 1 tablet daily  (breakfast)  VIAL medications: Latanoprost 0.005 % eye drops - 1 drop in each eye daily Furosemide 20 mg - 1 tablet daily (breakfast) as needed Lantus Solostar - inject 10 units daily  Patient declined the following medications this month: None  Any concerns about your medications? No  How often do you forget or accidentally miss a dose? Never   Is patient in packaging Yes  What is the date on your next pill pack? Patient not at home to read package  Any concerns or issues with your packaging? Patient reports very happy with the  packaging    No refill request needed.  Confirmed delivery date of 03/02/21, advised patient that pharmacy will contact them the morning of delivery.  Recent blood pressure readings are as follows: none available   Recent blood glucose readings are as follows:  none available    Annual wellness visit in last year? Yes Most Recent BP reading: 138/74  68-P  01/26/21  If Diabetic: Most recent A1C reading:8.3  Last eye exam / retinopathy screening:03/23/20 Last diabetic foot exam: 11/07/20   MDebbora Dus CPP notified  VAvel Sensor CLake WildernessAssistant [phone] I have reviewed the care management and care coordination activities outlined in this encounter and I am certifying that I agree with the content  of this note. See addendum.  [name], PharmD Clinical Pharmacist [facility] at [facility] [phone]

## 2021-02-21 NOTE — Telephone Encounter (Addendum)
Please ask if [place] would like [place] to send vitamin B12 500 mcg once daily (recommended by PCP on recent visit). Please also confirm if she needs Lantus.

## 2021-02-22 NOTE — Telephone Encounter (Signed)
Unable to reach patient to discuss concerns below. We will go ahead and send the Lantus but hold off on vitamin B12 until pt confirms cost.  - Avel Sensor, CMA

## 2021-02-26 ENCOUNTER — Ambulatory Visit: Payer: Medicare Other | Admitting: Podiatry

## 2021-03-21 ENCOUNTER — Other Ambulatory Visit: Payer: Self-pay | Admitting: Family Medicine

## 2021-03-21 ENCOUNTER — Ambulatory Visit: Payer: Medicare Other | Admitting: Podiatry

## 2021-03-21 ENCOUNTER — Other Ambulatory Visit: Payer: Self-pay | Admitting: Cardiology

## 2021-03-21 DIAGNOSIS — E785 Hyperlipidemia, unspecified: Secondary | ICD-10-CM

## 2021-03-22 ENCOUNTER — Telehealth: Payer: Self-pay

## 2021-03-22 DIAGNOSIS — F332 Major depressive disorder, recurrent severe without psychotic features: Secondary | ICD-10-CM

## 2021-03-22 NOTE — Telephone Encounter (Signed)
Patient reports she may be interested in counseling services/behavioral health. She's hoping this would improve motivation to take care of herself and monitor her diabetes. I'm not sure if a referral is needed.

## 2021-03-22 NOTE — Chronic Care Management (AMB) (Addendum)
Chronic Care Management Pharmacy Assistant   Name: TOBY AYAD  MRN: [id number] DOB: [birth year]  Reason for Encounter: Medication Adherence and Delivery Coordination   Recent office visits:  None since last CCM contact  Recent consult visits:  None since last CCM contact  Hospital visits:  None in previous 6 months  Medications: Outpatient Encounter Medications as of 03/22/2021  Medication Sig Note   acetaminophen (TYLENOL) 500 MG tablet Take 500 mg by mouth as needed for moderate pain.    albuterol (PROAIR HFA) 108 (90 Base) MCG/ACT inhaler Inhale 2 puffs into the lungs every 6 (six) hours as needed for wheezing or shortness of breath.    Aloe Vera 72 % CREA Apply to burned areas four times daily.    atorvastatin (LIPITOR) 40 MG tablet TAKE ONE TABLET BY MOUTH EVERY EVENING    Blood Glucose Monitoring Suppl (ONE TOUCH ULTRA 2) w/Device KIT Use as instructed to check blood sugar 2 times daily as needed.    clopidogrel (PLAVIX) 75 MG tablet Take 1 tablet (75 mg total) by mouth daily. Pt needs to make appt with provider to receive further refills - 2nd attempt    Cyanocobalamin (VITAMIN B12) 500 MCG TABS Take 1 tablet by mouth daily.    dapagliflozin propanediol (FARXIGA) 5 MG TABS tablet Take 5 mg by mouth daily.    docusate sodium (COLACE) 100 MG capsule Take 1 capsule (100 mg total) by mouth daily. (Patient taking differently: Take 100 mg by mouth daily as needed for mild constipation.) 07/01/2020: Takes as needed   doxycycline (VIBRAMYCIN) 100 MG capsule Take 1 capsule (100 mg total) by mouth 2 (two) times daily.    DULoxetine (CYMBALTA) 30 MG capsule Take 30 mg by mouth every morning.    ELIQUIS 5 MG TABS tablet TAKE ONE TABLET BY MOUTH TWICE DAILY    feeding supplement, GLUCERNA SHAKE, (GLUCERNA SHAKE) LIQD Take 237 mLs by mouth 3 (three) times daily between meals.    furosemide (LASIX) 20 MG tablet Take 1 tablet (20 mg total) by mouth daily as needed for fluid or edema (leg  swelling).    gabapentin (NEURONTIN) 300 MG capsule Take 1 capsule (300 mg total) by mouth 2 (two) times daily.    glipiZIDE (GLUCOTROL) 5 MG tablet TAKE ONE TABLET BY MOUTH TWICE DAILY    glucose blood (ONETOUCH ULTRA) test strip Use as instructed to check blood sugar 2 times daily as needed    insulin glargine (LANTUS SOLOSTAR) 100 UNIT/ML Solostar Pen Inject 10 Units into the skin daily.    isosorbide mononitrate (IMDUR) 30 MG 24 hr tablet Take 0.5 tablets (15 mg total) by mouth daily. Pt needs to make yearly appt with provider to receive further refills - 2nd attempt    latanoprost (XALATAN) 0.005 % ophthalmic solution Place 1 drop into both eyes at bedtime.     metFORMIN (GLUCOPHAGE-XR) 500 MG 24 hr tablet TAKE TWO TABLETS BY MOUTH EVERY MORNING    metoCLOPramide (REGLAN) 10 MG tablet Take 1 tablet (10 mg total) by mouth 4 (four) times daily -  before meals and at bedtime.    metoprolol succinate (TOPROL XL) 25 MG 24 hr tablet Take 1 tablet (25 mg total) by mouth daily.    nitroGLYCERIN (NITROSTAT) 0.4 MG SL tablet Place 1 tablet (0.4 mg total) under the tongue every 5 (five) minutes x 3 doses as needed for chest pain.    ondansetron (ZOFRAN-ODT) 4 MG disintegrating tablet Take 1 tablet (4 mg  total) by mouth every 8 (eight) hours as needed for nausea or vomiting.    OneTouch Delica Lancets 94I MISC Use as directed to check blood sugar 2 times daily as needed    pantoprazole (PROTONIX) 40 MG tablet Take 1 tablet (40 mg total) by mouth 2 (two) times daily.    polyethylene glycol (MIRALAX / GLYCOLAX) 17 g packet Take 17 g by mouth daily as needed for moderate constipation.    No facility-administered encounter medications on file as of 03/22/2021.   BP Readings from Last 3 Encounters:  02/06/21 131/81  01/26/21 138/74  09/09/20 130/65    Lab Results  Component Value Date   HGBA1C 8.3 (A) 01/26/2021      No OVs, Consults, or hospital visits since last care coordination call / Pharmacist  visit. No medication changes indicated   Last adherence delivery date:03/02/21      Patient is due for next adherence delivery on: 04/03/21  Spoke with patient on 03/22/21 reviewed medications and coordinated delivery.  This delivery to include: Adherence Packaging  30 Days  Packs: Farxiga 5 mg - 1 tablet daily (1 breakfast) Pantoprazole 40 mg - 1 tablet twice daily (1 breakfast, 1 evening meal) Gabapentin 300 mg- 1 tablet twice daily (1 breakfast, 1 evening meal) Duloxetine 30 mg - 1 capsule daily (1 breakfast) Atorvastatin 40 mg - 1 tablet daily (1 evening meal) Isosorbide Mononitrate 30 mg - 1/2 tablet daily (1/2 breakfast) Clopidogrel 75 mg - 1 tablet daily (1 breakfast) Glipizide 5 mg - 1 tablet twice daily (1 breakfast, 1 evening meal) Metformin 500 mg ER - 2 tablets ( breakfast) Eliquis 5 mg- 1 tablet twice daily (1 breakfast, 1 evening meal) Metoprolol succinate 25 mg ER - 1 tablet daily  (breakfast)   VIAL medications: Latanoprost 0.005 % eye drops - 1 drop in each eye daily  Patient declined the following medications this month: Lantus Solostar - inject 10 units daily Furosemide 20 mg - 1 tablet daily (breakfast) as needed Vitamin B12-  539m -Patient gets for zero co-pay at [facility]  Any concerns about your medications? The patient states she has plenty supply of furosemide (not taking often - PRN) and Lantus (she is not using at this time)  How often do you forget or accidentally miss a dose? Never  Is patient in packaging Yes  What is the date on your next pill pack? Patient not near box  Any concerns or issues with your packaging? Patient very happy with packaging process  Refills requested from providers include: clopidogrel, isosorbide mono, furosemide  Confirmed delivery date of 04/03/21 , advised patient that pharmacy will contact them the morning of delivery.  No BP readings available   No BG readings available -Patient encouraged to start taking readings.  She reports she may be interested in counseling services to assist with motivation to care for herself and monitor her diabetes.  Annual wellness visit in last year? Yes Most Recent BP reading: 138/74 68  01/26/21  If Diabetic: Most recent A1C reading: 8.3% Last eye exam / retinopathy screening: Up to date Last diabetic foot exam: Up to date  MDebbora Dus CPP notified  VAvel Sensor CBardoniaAssistant [phone] I have reviewed the care management and care coordination activities outlined in this encounter and I am certifying that I agree with the content of this note. No further action required.  MDebbora Dus PharmD Clinical Pharmacist [phone]

## 2021-03-22 NOTE — Telephone Encounter (Signed)
Psychology referral placed.

## 2021-03-23 NOTE — Telephone Encounter (Signed)
Can you let Maria Fox know we put in a referral for counseling services. She can await a call regarding scheduling.

## 2021-04-03 ENCOUNTER — Ambulatory Visit: Payer: Medicare Other | Admitting: Podiatry

## 2021-04-13 NOTE — Telephone Encounter (Signed)
A referral is really not needed for Talk Therapy, only for Psychiatry.   If the patient is wanting to go to an outside location for counseling then we can give the options for locations OR we can send the referral over to [facility]  and they will reach out to her to schedule.   Below are locations in [place] that I give patients to contact.  Most Psychology offices require the appointment be scheduled by the patient directly given the type of referral it is.   -[facility] Address: [address] [place], [place], [place] [address] Phone: [phone]  -[facility] Address: [address] [place], [place], [place] [address] Phone: [phone]  -[facility] Address: [address], [place], [place] [address] Phone: [phone]  They can also ask their insurance if a referral is needed for this type of appointment - majority of the time referrals are not required.   Thanks! Varney Daily Referral Coordinator/CMA

## 2021-04-17 ENCOUNTER — Other Ambulatory Visit: Payer: Self-pay

## 2021-04-17 ENCOUNTER — Ambulatory Visit (INDEPENDENT_AMBULATORY_CARE_PROVIDER_SITE_OTHER): Payer: Medicare Other | Admitting: Podiatry

## 2021-04-17 ENCOUNTER — Encounter: Payer: Self-pay | Admitting: Podiatry

## 2021-04-17 DIAGNOSIS — B351 Tinea unguium: Secondary | ICD-10-CM | POA: Diagnosis not present

## 2021-04-17 DIAGNOSIS — M79675 Pain in left toe(s): Secondary | ICD-10-CM | POA: Diagnosis not present

## 2021-04-17 DIAGNOSIS — M79674 Pain in right toe(s): Secondary | ICD-10-CM | POA: Diagnosis not present

## 2021-04-17 DIAGNOSIS — E0865 Diabetes mellitus due to underlying condition with hyperglycemia: Secondary | ICD-10-CM | POA: Diagnosis not present

## 2021-04-17 NOTE — Telephone Encounter (Signed)
Contacted the patient and discussed that she does not need referral for talk therapy. List provided to the patient with several options and locations to choose from. The patient appreciated the information.  [name], CPP notified  Avel Sensor, [place] Assistant [phone]  Total time spent for month CPA: 10 min.

## 2021-04-19 ENCOUNTER — Other Ambulatory Visit: Payer: Self-pay | Admitting: Cardiology

## 2021-04-19 DIAGNOSIS — E785 Hyperlipidemia, unspecified: Secondary | ICD-10-CM

## 2021-04-20 ENCOUNTER — Telehealth: Payer: Self-pay

## 2021-04-20 NOTE — Chronic Care Management (AMB) (Addendum)
° ° °Chronic Care Management °Pharmacy Assistant  ° °Name: Maria Fox  MRN: [id number] DOB: 03/02/1946 ° °Reason for Encounter: Medication Adherence and Delivery Coordination °  °Recent office visits:  °None since last CCM contact ° °Recent consult visits:  °04/17/21-Podiatry-Patient presented for nail problem, diabetic foot care.No medication changes ° °Hospital visits:  °None in previous 6 months ° °Medications: °Outpatient Encounter Medications as of 04/20/2021  °Medication Sig Note  ° acetaminophen (TYLENOL) 500 MG tablet Take 500 mg by mouth as needed for moderate pain.   ° albuterol (PROAIR HFA) 108 (90 Base) MCG/ACT inhaler Inhale 2 puffs into the lungs every 6 (six) hours as needed for wheezing or shortness of breath.   ° Aloe Vera 72 % CREA Apply to burned areas four times daily.   ° atorvastatin (LIPITOR) 40 MG tablet TAKE ONE TABLET BY MOUTH EVERY EVENING   ° Blood Glucose Monitoring Suppl (ONE TOUCH ULTRA 2) w/Device KIT Use as instructed to check blood sugar 2 times daily as needed.   ° clopidogrel (PLAVIX) 75 MG tablet Take 1 tablet (75 mg total) by mouth daily. Pt needs to make appt with provider to receive further refills - 2nd attempt   ° Cyanocobalamin (VITAMIN B12) 500 MCG TABS Take 1 tablet by mouth daily.   ° dapagliflozin propanediol (FARXIGA) 5 MG TABS tablet Take 5 mg by mouth daily.   ° docusate sodium (COLACE) 100 MG capsule Take 1 capsule (100 mg total) by mouth daily. (Patient taking differently: Take 100 mg by mouth daily as needed for mild constipation.) 07/01/2020: Takes as needed  ° doxycycline (VIBRAMYCIN) 100 MG capsule Take 1 capsule (100 mg total) by mouth 2 (two) times daily.   ° DULoxetine (CYMBALTA) 30 MG capsule Take 30 mg by mouth every morning.   ° ELIQUIS 5 MG TABS tablet TAKE ONE TABLET BY MOUTH TWICE DAILY   ° feeding supplement, GLUCERNA SHAKE, (GLUCERNA SHAKE) LIQD Take 237 mLs by mouth 3 (three) times daily between meals.   ° furosemide (LASIX) 20 MG tablet Take 1 tablet  (20 mg total) by mouth daily as needed for fluid or edema (leg swelling).   ° gabapentin (NEURONTIN) 300 MG capsule Take 1 capsule (300 mg total) by mouth 2 (two) times daily.   ° glipiZIDE (GLUCOTROL) 5 MG tablet TAKE ONE TABLET BY MOUTH TWICE DAILY   ° glucose blood (ONETOUCH ULTRA) test strip Use as instructed to check blood sugar 2 times daily as needed   ° insulin glargine (LANTUS SOLOSTAR) 100 UNIT/ML Solostar Pen Inject 10 Units into the skin daily.   ° isosorbide mononitrate (IMDUR) 30 MG 24 hr tablet Take 0.5 tablets (15 mg total) by mouth daily. Pt needs to make yearly appt with provider to receive further refills - 2nd attempt   ° latanoprost (XALATAN) 0.005 % ophthalmic solution Place 1 drop into both eyes at bedtime.    ° metFORMIN (GLUCOPHAGE-XR) 500 MG 24 hr tablet TAKE TWO TABLETS BY MOUTH EVERY MORNING   ° metoCLOPramide (REGLAN) 10 MG tablet Take 1 tablet (10 mg total) by mouth 4 (four) times daily -  before meals and at bedtime.   ° metoprolol succinate (TOPROL XL) 25 MG 24 hr tablet Take 1 tablet (25 mg total) by mouth daily.   ° nitroGLYCERIN (NITROSTAT) 0.4 MG SL tablet Place 1 tablet (0.4 mg total) under the tongue every 5 (five) minutes x 3 doses as needed for chest pain.   ° ondansetron (ZOFRAN-ODT) 4 MG disintegrating tablet   tablet Take 1 tablet (4 mg total) by mouth every 8 (eight) hours as needed for nausea or vomiting.    OneTouch Delica Lancets 15X MISC Use as directed to check blood sugar 2 times daily as needed    pantoprazole (PROTONIX) 40 MG tablet Take 1 tablet (40 mg total) by mouth 2 (two) times daily.    polyethylene glycol (MIRALAX / GLYCOLAX) 17 g packet Take 17 g by mouth daily as needed for moderate constipation.    No facility-administered encounter medications on file as of 04/20/2021.   BP Readings from Last 3 Encounters:  02/06/21 131/81  01/26/21 138/74  09/09/20 130/65    Lab Results  Component Value Date   HGBA1C 8.3 (A) 01/26/2021      Recent OV, Consult or  Hospital visit:  04/17/21 Podiatry   Last adherence delivery date:04/03/21      Patient is due for next adherence delivery on: 05/02/21  Multiple attempts made to reach patient. Unsuccessful outreach. Will refill based off of last adherence fill.   This delivery to include: Adherence Packaging  30 Days  Packs: Farxiga 5 mg - 1 tablet daily (1 breakfast) Pantoprazole 40 mg - 1 tablet twice daily (1 breakfast, 1 evening meal) Gabapentin 300 mg- 1 tablet twice daily (1 breakfast, 1 evening meal) Duloxetine 30 mg - 1 capsule daily (1 breakfast) Atorvastatin 40 mg - 1 tablet daily (1 evening meal) Isosorbide Mononitrate 30 mg - 1/2 tablet daily (1/2 breakfast) Clopidogrel 75 mg - 1 tablet daily (1 breakfast) Glipizide 5 mg - 1 tablet twice daily (1 breakfast, 1 evening meal) Metformin 500 mg ER - 2 tablets ( breakfast) Eliquis 5 mg- 1 tablet twice daily (1 breakfast, 1 evening meal) Metoprolol succinate 25 mg ER - 1 tablet daily  (breakfast)  VIAL medications: Latanoprost 0.005 % eye drops - 1 drop in each eye daily Lantus Solostar - inject 10 units daily  Patient declined the following medications this month: Unavailable to discuss  Any concerns about your medications? None  Is patient in packaging Yes  Refills requested from providers include: clopidogrel  Delivery scheduled for 05/02/21. Unable to speak with patient to confirm date.   Annual wellness visit in last year? Yes Most Recent BP reading:  138/74  If Diabetic: Most recent A1C reading: 8.3% Last eye exam / retinopathy screening: UTD Last diabetic foot exam: 04/17/21  [name], CPP notified  Avel Sensor, [place] Assistant [phone]  I have reviewed the care management and care coordination activities outlined in this encounter and I am certifying that I agree with the content of this note. No further action required.  [name], PharmD Clinical Pharmacist [facility] at  [facility] [phone]

## 2021-04-23 NOTE — Progress Notes (Signed)
°  Subjective:  Patient ID: Maria Fox, female    DOB: 1945/06/08,  MRN: [id number]  Maria Fox presents to clinic today for preventative diabetic foot care and painful elongated mycotic toenails 1-5 bilaterally which are tender when wearing enclosed shoe gear. Pain is relieved with periodic professional debridement.  Patient did not check blood glucose today.  New problem(s): None.   PCP is Maria Bush, MD , and last visit was January 26, 2021.  Allergies  Allergen Reactions   Bee Venom     Throat swelling   Mushroom Extract Complex Anaphylaxis   Penicillins Anaphylaxis, Hives and Swelling    Tolerates cephalosporins including cephalexin multiple times.  Has patient had a PCN reaction causing immediate rash, facial/tongue/throat swelling, SOB or lightheadedness with hypotension: Yes Has patient had a PCN reaction causing severe rash involving mucus membranes or skin necrosis: Yes Has patient had a PCN reaction that required hospitalization Yes Has patient had a PCN reaction occurring within the last 10 years: No     Codeine Nausea And Vomiting   Sulfa Antibiotics Nausea And Vomiting   Iohexol Itching and Swelling   Erythromycin Base Rash    Review of Systems: Negative except as noted in the HPI. Objective:   Constitutional Maria Fox is a pleasant 76 y.o. Caucasian female, WD, WN in NAD. AAO x 3.   Vascular CFT immediate b/l LE. Palpable DP/PT pulses b/l LE. Digital hair sparse b/l. Skin temperature gradient WNL b/l. No pain with calf compression b/l. No edema noted b/l. No cyanosis or clubbing noted b/l LE.  Neurologic Normal speech. Oriented to person, place, and time. Protective sensation intact 5/5 intact bilaterally with 10g monofilament b/l. Vibratory sensation intact b/l.  Dermatologic Pedal skin is warm and supple b/l LE. No open wounds b/l LE. No interdigital macerations noted b/l LE. Toenails 2-5 bilaterally elongated, discolored, dystrophic, thickened, and  crumbly with subungual debris and tenderness to dorsal palpation. Anonychia noted bilateral great toes. Nailbed(s) epithelialized.   Orthopedic: Normal muscle strength 5/5 to all lower extremity muscle groups bilaterally. No pain, crepitus or joint limitation noted with ROM b/l LE. No gross bony pedal deformities b/l. Patient ambulates independently without assistive aids.   Radiographs: None  Last A1c:  Hemoglobin A1C Latest Ref Rng & Units 01/26/2021 07/03/2020 05/02/2020  HGBA1C 4.0 - 5.6 % 8.3(A) 11.6(H) 12.2(H)  Some recent data might be hidden    Assessment:   1. Pain due to onychomycosis of toenails of both feet   2. Diabetes mellitus due to underlying condition, uncontrolled, with hyperglycemia (Maria Fox)    Plan:  Patient was evaluated and treated and all questions answered. Consent given for treatment as described below: -No new findings. No new orders. -Mycotic toenails 2-5 bilaterally were debrided in length and girth with sterile nail nippers and dremel without iatrogenic bleeding. -Patient/POA to call should there be question/concern in the interim.  Return in about 3 months (around 07/15/2021).  Marzetta Board, DPM

## 2021-04-27 NOTE — Chronic Care Management (AMB) (Signed)
Spoke with the patient and advised her to schedule appointment with cardiology due to needing authorization for refill on clopidogrel 75mg . She understood and will schedule appointment.  [name], CPP notified  Avel Sensor, [place] Assistant [phone]  Total time spent for month CPA: 10 min.

## 2021-05-08 ENCOUNTER — Other Ambulatory Visit: Payer: Self-pay | Admitting: Family Medicine

## 2021-05-08 DIAGNOSIS — M25531 Pain in right wrist: Secondary | ICD-10-CM | POA: Diagnosis not present

## 2021-05-08 NOTE — Telephone Encounter (Signed)
Refill request Zofran Last refill 01/31/21 #50 Last office visit 01/26/21

## 2021-05-08 NOTE — Addendum Note (Signed)
Addended by: Emelia Salisbury C on: 05/08/2021 04:08 PM   Modules accepted: Orders

## 2021-05-08 NOTE — Telephone Encounter (Signed)
Pt needs a refill on ondansetron (ZOFRAN-ODT) 4 MG disintegrating tablet sen to [facility]

## 2021-05-09 MED ORDER — ONDANSETRON 4 MG PO TBDP: 4.0000 mg | ORAL_TABLET | Freq: Three times a day (TID) | ORAL | 0 refills | Status: DC | PRN

## 2021-05-21 ENCOUNTER — Telehealth: Payer: Self-pay

## 2021-05-21 ENCOUNTER — Telehealth: Payer: Self-pay | Admitting: Cardiology

## 2021-05-21 DIAGNOSIS — E785 Hyperlipidemia, unspecified: Secondary | ICD-10-CM

## 2021-05-21 MED ORDER — CLOPIDOGREL BISULFATE 75 MG PO TABS: 75.0000 mg | ORAL_TABLET | Freq: Every day | ORAL | 4 refills | Status: DC

## 2021-05-21 NOTE — Chronic Care Management (AMB) (Unsigned)
Chronic Care Management Pharmacy Assistant   Name: Maria Fox  MRN: [id number] DOB: 1946-01-28   Reason for Encounter: Medication Adherence and Delivery Coordination     Recent office visits:  ***  Recent consult visits:  [facility] visits:  {Hospital DC Yes/No:[id number]}  Medications: Outpatient Encounter Medications as of 05/21/2021  Medication Sig Note   acetaminophen (TYLENOL) 500 MG tablet Take 500 mg by mouth as needed for moderate pain.    albuterol (PROAIR HFA) 108 (90 Base) MCG/ACT inhaler Inhale 2 puffs into the lungs every 6 (six) hours as needed for wheezing or shortness of breath.    Aloe Vera 72 % CREA Apply to burned areas four times daily.    atorvastatin (LIPITOR) 40 MG tablet TAKE ONE TABLET BY MOUTH EVERY EVENING    Blood Glucose Monitoring Suppl (ONE TOUCH ULTRA 2) w/Device KIT Use as instructed to check blood sugar 2 times daily as needed.    clopidogrel (PLAVIX) 75 MG tablet Take 1 tablet (75 mg total) by mouth daily. Pt needs to make appt with provider to receive further refills - 2nd attempt    Cyanocobalamin (VITAMIN B12) 500 MCG TABS Take 1 tablet by mouth daily.    dapagliflozin propanediol (FARXIGA) 5 MG TABS tablet Take 5 mg by mouth daily.    docusate sodium (COLACE) 100 MG capsule Take 1 capsule (100 mg total) by mouth daily. (Patient taking differently: Take 100 mg by mouth daily as needed for mild constipation.) 07/01/2020: Takes as needed   doxycycline (VIBRAMYCIN) 100 MG capsule Take 1 capsule (100 mg total) by mouth 2 (two) times daily.    DULoxetine (CYMBALTA) 30 MG capsule Take 30 mg by mouth every morning.    ELIQUIS 5 MG TABS tablet TAKE ONE TABLET BY MOUTH TWICE DAILY    feeding supplement, GLUCERNA SHAKE, (GLUCERNA SHAKE) LIQD Take 237 mLs by mouth 3 (three) times daily between meals.    furosemide (LASIX) 20 MG tablet Take 1 tablet (20 mg total) by mouth daily as needed for fluid or edema (leg swelling).    gabapentin (NEURONTIN)  300 MG capsule Take 1 capsule (300 mg total) by mouth 2 (two) times daily.    glipiZIDE (GLUCOTROL) 5 MG tablet TAKE ONE TABLET BY MOUTH TWICE DAILY    glucose blood (ONETOUCH ULTRA) test strip Use as instructed to check blood sugar 2 times daily as needed    insulin glargine (LANTUS SOLOSTAR) 100 UNIT/ML Solostar Pen Inject 10 Units into the skin daily.    isosorbide mononitrate (IMDUR) 30 MG 24 hr tablet Take 0.5 tablets (15 mg total) by mouth daily. Pt needs to make yearly appt with provider to receive further refills - 2nd attempt    latanoprost (XALATAN) 0.005 % ophthalmic solution Place 1 drop into both eyes at bedtime.     metFORMIN (GLUCOPHAGE-XR) 500 MG 24 hr tablet TAKE TWO TABLETS BY MOUTH EVERY MORNING    metoCLOPramide (REGLAN) 10 MG tablet Take 1 tablet (10 mg total) by mouth 4 (four) times daily -  before meals and at bedtime.    metoprolol succinate (TOPROL XL) 25 MG 24 hr tablet Take 1 tablet (25 mg total) by mouth daily.    nitroGLYCERIN (NITROSTAT) 0.4 MG SL tablet Place 1 tablet (0.4 mg total) under the tongue every 5 (five) minutes x 3 doses as needed for chest pain.    ondansetron (ZOFRAN-ODT) 4 MG disintegrating tablet Take 1 tablet (4 mg total) by mouth every 8 (eight) hours  as needed for nausea or vomiting.    OneTouch Delica Lancets 60Y MISC Use as directed to check blood sugar 2 times daily as needed    pantoprazole (PROTONIX) 40 MG tablet Take 1 tablet (40 mg total) by mouth 2 (two) times daily.    polyethylene glycol (MIRALAX / GLYCOLAX) 17 g packet Take 17 g by mouth daily as needed for moderate constipation.    No facility-administered encounter medications on file as of 05/21/2021.   BP Readings from Last 3 Encounters:  02/06/21 131/81  01/26/21 138/74  09/09/20 130/65    Lab Results  Component Value Date   HGBA1C 8.3 (A) 01/26/2021      {[facility] med sync 1:25020} {[facility] med sync 2:25021}   Last adherence delivery date:05/02/21      Patient is due  for next adherence delivery on: 05/31/21  Spoke with patient on 05/31/21 reviewed medications and coordinated delivery.  This delivery to include: Adherence Packaging  30 Days  Packs: Farxiga 5 mg - 1 tablet daily (1 breakfast) Pantoprazole 40 mg - 1 tablet twice daily (1 breakfast, 1 evening meal) Gabapentin 300 mg- 1 tablet twice daily (1 breakfast, 1 evening meal) Duloxetine 30 mg - 1 capsule daily (1 breakfast) Atorvastatin 40 mg - 1 tablet daily (1 evening meal) Isosorbide Mononitrate 30 mg - 1/2 tablet daily (1/2 breakfast) Glipizide 5 mg - 1 tablet twice daily (1 breakfast, 1 evening meal) Metformin 500 mg ER - 2 tablets ( breakfast) Eliquis 5 mg- 1 tablet twice daily (1 breakfast, 1 evening meal) Metoprolol succinate 25 mg ER - 1 tablet daily  (breakfast)  VIAL medications: Latanoprost 0.005 % eye drops - 1 drop in each eye daily   Patient declined the following medications this month:    Any concerns about your medications? {yes/no:20286}  How often do you forget or accidentally miss a dose? {Missed doses:25554}  Do you use a pillbox? {yes/no:20286}  Is patient in packaging Yes  If yes  What is the date on your next pill pack?  Any concerns or issues with your packaging?   {refills needed:25320}  {Delivery date:25786}  Recent blood pressure readings are as follows:***   Recent blood glucose readings are as follows:*** Fasting:     Annual wellness visit in last year? Yes Most Recent BP reading:131/81  94-P  02/06/21  ED  If Diabetic: Most recent A1C reading: 8.3% Last eye exam / retinopathy screening: UTD Last diabetic foot exam: 04/17/21 Charlene Brooke, CPP notified  Avel Sensor, [place]  [phone]

## 2021-05-21 NOTE — Telephone Encounter (Signed)
Pt's medication was sent to pt's pharmacy as requested. Confirmation received.  °

## 2021-05-21 NOTE — Telephone Encounter (Signed)
?*  STAT* If patient is at the pharmacy, call can be transferred to refill team. ? ? ?1. Which medications need to be refilled? (please list name of each medication and dose if known)  ?clopidogrel (PLAVIX) 75 MG tablet ? ?2. Which pharmacy/location (including street and city if local pharmacy) is medication to be sent to? ?[facility]  ? ?3. Do they need a 30 day or 90 day supply? 30 with refills ? ?Patient is scheduled to see Dr. Radford Pax 09/24/21 at 2:20 pm ?

## 2021-06-08 ENCOUNTER — Ambulatory Visit: Payer: Medicare Other | Admitting: Podiatry

## 2021-06-11 ENCOUNTER — Telehealth: Payer: Self-pay

## 2021-06-11 NOTE — Chronic Care Management (AMB) (Signed)
? ? ?Chronic Care Management ?Pharmacy Assistant  ? ?Name: Maria Fox  MRN: [id number] DOB: 25-Jan-1946 ? ? ?Reason for Encounter: Reminder Call ?  ?Conditions to be addressed/monitored: ?CAD, HTN, HLD, and DMII ? ? ?Medications: ?Outpatient Encounter Medications as of 06/11/2021  ?Medication Sig Note  ? acetaminophen (TYLENOL) 500 MG tablet Take 500 mg by mouth as needed for moderate pain.   ? albuterol (PROAIR HFA) 108 (90 Base) MCG/ACT inhaler Inhale 2 puffs into the lungs every 6 (six) hours as needed for wheezing or shortness of breath.   ? Aloe Vera 72 % CREA Apply to burned areas four times daily.   ? atorvastatin (LIPITOR) 40 MG tablet TAKE ONE TABLET BY MOUTH EVERY EVENING   ? Blood Glucose Monitoring Suppl (ONE TOUCH ULTRA 2) w/Device KIT Use as instructed to check blood sugar 2 times daily as needed.   ? clopidogrel (PLAVIX) 75 MG tablet Take 1 tablet (75 mg total) by mouth daily.   ? Cyanocobalamin (VITAMIN B12) 500 MCG TABS Take 1 tablet by mouth daily.   ? dapagliflozin propanediol (FARXIGA) 5 MG TABS tablet Take 5 mg by mouth daily.   ? docusate sodium (COLACE) 100 MG capsule Take 1 capsule (100 mg total) by mouth daily. (Patient taking differently: Take 100 mg by mouth daily as needed for mild constipation.) 07/01/2020: Takes as needed  ? doxycycline (VIBRAMYCIN) 100 MG capsule Take 1 capsule (100 mg total) by mouth 2 (two) times daily.   ? DULoxetine (CYMBALTA) 30 MG capsule Take 30 mg by mouth every morning.   ? ELIQUIS 5 MG TABS tablet TAKE ONE TABLET BY MOUTH TWICE DAILY   ? feeding supplement, GLUCERNA SHAKE, (GLUCERNA SHAKE) LIQD Take 237 mLs by mouth 3 (three) times daily between meals.   ? furosemide (LASIX) 20 MG tablet Take 1 tablet (20 mg total) by mouth daily as needed for fluid or edema (leg swelling).   ? gabapentin (NEURONTIN) 300 MG capsule Take 1 capsule (300 mg total) by mouth 2 (two) times daily.   ? glipiZIDE (GLUCOTROL) 5 MG tablet TAKE ONE TABLET BY MOUTH TWICE DAILY   ? glucose  blood (ONETOUCH ULTRA) test strip Use as instructed to check blood sugar 2 times daily as needed   ? insulin glargine (LANTUS SOLOSTAR) 100 UNIT/ML Solostar Pen Inject 10 Units into the skin daily.   ? isosorbide mononitrate (IMDUR) 30 MG 24 hr tablet Take 0.5 tablets (15 mg total) by mouth daily. Pt needs to make yearly appt with provider to receive further refills - 2nd attempt   ? latanoprost (XALATAN) 0.005 % ophthalmic solution Place 1 drop into both eyes at bedtime.    ? metFORMIN (GLUCOPHAGE-XR) 500 MG 24 hr tablet TAKE TWO TABLETS BY MOUTH EVERY MORNING   ? metoCLOPramide (REGLAN) 10 MG tablet Take 1 tablet (10 mg total) by mouth 4 (four) times daily -  before meals and at bedtime.   ? metoprolol succinate (TOPROL XL) 25 MG 24 hr tablet Take 1 tablet (25 mg total) by mouth daily.   ? nitroGLYCERIN (NITROSTAT) 0.4 MG SL tablet Place 1 tablet (0.4 mg total) under the tongue every 5 (five) minutes x 3 doses as needed for chest pain.   ? ondansetron (ZOFRAN-ODT) 4 MG disintegrating tablet Take 1 tablet (4 mg total) by mouth every 8 (eight) hours as needed for nausea or vomiting.   ? OneTouch Delica Lancets 42V MISC Use as directed to check blood sugar 2 times daily as needed   ?  pantoprazole (PROTONIX) 40 MG tablet Take 1 tablet (40 mg total) by mouth 2 (two) times daily.   ? polyethylene glycol (MIRALAX / GLYCOLAX) 17 g packet Take 17 g by mouth daily as needed for moderate constipation.   ? ?No facility-administered encounter medications on file as of 06/11/2021.  ? ? ?Maria Fox was contacted to remind of upcoming telephone visit with Maria Fox on 06/14/21 at 8:45am. Patient was reminded to have any blood glucose and blood pressure readings available for review at appointment.  ? ?Patient confirmed appointment. ? ? ?Are you having any problems with your medications? No  ? ?Do you have any concerns you like to discuss with the pharmacist? No ? ? ? ?Star Rating Drugs: ?Medication:  Last Fill: Day  Supply ?Atorvastatin 36m 04/26/21 30 ?Farxiga 589m 04/26/21 30 ?Glipizide 50m36m2/16/23 30 ?Lantus   02/27/21 30 ?Metformin XR 500m350m/16/23 30 ? ?LindCharlene BrookeP notified ? ?Tayna [place]eth[place]st, CCMA ?Health concierge  ?[phone]

## 2021-06-14 ENCOUNTER — Ambulatory Visit (INDEPENDENT_AMBULATORY_CARE_PROVIDER_SITE_OTHER): Payer: Medicare Other | Admitting: Pharmacist

## 2021-06-14 DIAGNOSIS — F332 Major depressive disorder, recurrent severe without psychotic features: Secondary | ICD-10-CM

## 2021-06-14 DIAGNOSIS — E1149 Type 2 diabetes mellitus with other diabetic neurological complication: Secondary | ICD-10-CM

## 2021-06-14 NOTE — Progress Notes (Signed)
? ?Chronic Care Management ?Pharmacy Note ? ?06/18/2021 ?Name:  Maria Fox MRN:  [id number] DOB:  01/08/1946 ? ?Summary: CCM F/U visit ?-A1c improved to 8.3 (01/2021); pt has not been taking Lantus, and today states she will not ever take insulin. She is not checking BG at home due to misplaced meter. ?  ?Recommendations/Changes made from today's visit: ?-Try Freestyle Libre 2 [facility] may cover this without insulin now) ?  ?Plan: ?-PCP 06/29/21 ?-[place] will call patient 2 month for DM update ?-Pharmacist follow up televisit scheduled for 3 months ? ? ?Subjective: ?Maria Fox is an 76 y.o. year old female who is a primary patient of [name], MD.  The CCM team was consulted for assistance with disease management and care coordination needs.   ? ?Engaged with patient by telephone for follow up visit in response to provider referral for pharmacy case management and/or care coordination services.  ? ?Consent to Services:  ?The patient was given information about Chronic Care Management services, agreed to services, and gave verbal consent prior to initiation of services.  Please see initial visit note for detailed documentation.  ? ?Patient Care Team: ?[name], MD as PCP - General (Family Medicine) ?Sueanne Margarita, MD as PCP - Cardiology (Cardiology) ?Nazire Fruth, Cleaster Cor[place], [facility] as Pharmacist (Pharmacist) ? ?Recent office visits: ?01/26/21 Dr [name] OV: f/u DM. Pt has been noncompliant with Lantus, she is taking pills. Restart Lantus. Restart B12 500 mcg daily. GFR worsened ? ?Recent consult visits: ?02/06/21 Urgent Care - vaginal abscess. Rx'd doxycycline ? ?Hospital visits: ?None in previous 6 months ? ? ?Objective: ? ?Lab Results  ?Component Value Date  ? CREATININE 1.45 (H) 01/26/2021  ? BUN 14 01/26/2021  ? GFR 37.02 (L) 05/02/2020  ? GFRNONAA >60 08/01/2020  ? GFRAA 41 (L) 01/28/2020  ? NA 141 01/26/2021  ? K 4.0 01/26/2021  ? CALCIUM 9.6 01/26/2021  ? CO2 21 01/26/2021  ? GLUCOSE  236 (H) 01/26/2021  ? ? ?Lab Results  ?Component Value Date/Time  ? HGBA1C 8.3 (A) 01/26/2021 03:50 PM  ? HGBA1C 11.6 (H) 07/03/2020 09:37 AM  ? HGBA1C 12.2 (H) 05/02/2020 03:25 PM  ? FRUCTOSAMINE 302 (H) 11/10/2015 04:27 PM  ? GFR 37.02 (L) 05/02/2020 03:25 PM  ? GFR 41.63 (L) 08/03/2019 01:03 PM  ? MICROALBUR <0.7 05/02/2020 03:25 PM  ? MICROALBUR <0.7 11/08/2019 05:08 PM  ?  ?Last diabetic Eye exam:  ?Lab Results  ?Component Value Date/Time  ? HMDIABEYEEXA Retinopathy (A) 03/23/2020 12:00 AM  ?  ?Last diabetic Foot exam: No results found for: HMDIABFOOTEX  ? ?Lab Results  ?Component Value Date  ? CHOL 109 05/02/2020  ? HDL 41.10 05/02/2020  ? [place] 40 05/02/2020  ? LDLDIRECT 60 01/26/2021  ? TRIG 139.0 05/02/2020  ? CHOLHDL 3 05/02/2020  ? ? ? ?  Latest Ref Rng & Units 08/01/2020  ?  1:01 AM 07/29/2020  ? 11:37 AM 07/11/2020  ?  6:12 AM  ?Hepatic Function  ?Total Protein 6.5 - 8.1 g/dL 5.7   7.8   5.7    ?Albumin 3.5 - 5.0 g/dL 2.7   3.7   2.6    ?AST 15 - 41 U/L '18   22   19    ' ?ALT 0 - 44 U/L '13   20   14    ' ?Alk Phosphatase 38 - 126 U/L 73   109   61    ?Total Bilirubin 0.3 - 1.2 mg/dL 0.4  1.2   0.1    ? ? ?Lab Results  ?Component Value Date/Time  ? TSH 1.187 07/15/2020 06:11 AM  ? TSH 1.247 07/10/2019 10:27 AM  ? TSH 2.64 05/02/2017 04:13 PM  ? TSH 3.457 07/08/2014 05:00 AM  ? FREET4 0.87 07/13/2014 11:18 AM  ? ? ? ?  Latest Ref Rng & Units 08/01/2020  ?  1:01 AM 07/31/2020  ?  1:56 AM 07/30/2020  ?  6:36 AM  ?CBC  ?WBC 4.0 - 10.5 K/uL 7.3   8.4   8.5    ?Hemoglobin 12.0 - 15.0 g/dL 10.5   10.4   11.6    ?Hematocrit 36.0 - 46.0 % 31.6   30.9   34.7    ?Platelets 150 - 400 K/uL 265   235   274    ? ? ?Lab Results  ?Component Value Date/Time  ? VD25OH 32.36 05/02/2020 03:25 PM  ? ? ?Clinical ASCVD: Yes  ?The ASCVD Risk score (Arnett DK, et al., 2019) failed to calculate for the following reasons: ?  The patient has a prior MI or stroke diagnosis   ? ? ?  06/06/2020  ?  3:38 PM 05/02/2020  ?  4:55 PM 03/15/2019  ?   3:07 PM  ?Depression screen PHQ 2/9  ?Decreased Interest 2 2 0  ?Down, Depressed, Hopeless 2 2 0  ?PHQ - 2 Score 4 4 0  ?Altered sleeping 0 0   ?Tired, decreased energy 0 2   ?Change in appetite 0 2   ?Feeling bad or failure about yourself  0 2   ?Trouble concentrating 0 1   ?Moving slowly or fidgety/restless 0 1   ?Suicidal thoughts 0 0   ?PHQ-9 Score 4 12   ?Difficult doing work/chores Not difficult at all Very difficult   ?  ? ?Social History  ? ?Tobacco Use  ?Smoking Status Never  ?Smokeless Tobacco Never  ? ?BP Readings from Last 3 Encounters:  ?02/06/21 131/81  ?01/26/21 138/74  ?09/09/20 130/65  ? ?Pulse Readings from Last 3 Encounters:  ?02/06/21 94  ?01/26/21 68  ?09/09/20 70  ? ?Wt Readings from Last 3 Encounters:  ?01/26/21 137 lb 7 oz (62.3 kg)  ?09/08/20 140 lb 3.4 oz (63.6 kg)  ?07/29/20 140 lb 3.4 oz (63.6 kg)  ? ?BMI Readings from Last 3 Encounters:  ?01/26/21 24.35 kg/m?  ?09/08/20 24.84 kg/m?  ?07/29/20 24.84 kg/m?  ? ? ?Assessment/Interventions: Review of patient past medical history, allergies, medications, health status, including review of consultants reports, laboratory and other test data, was performed as part of comprehensive evaluation and provision of chronic care management services.  ? ?SDOH:  (Social Determinants of Health) assessments and interventions performed: Yes ?SDOH Interventions   ? ?Flowsheet Row Most Recent Value  ?SDOH Interventions   ?Food Insecurity Interventions Intervention Not Indicated  ?Financial Strain Interventions Intervention Not Indicated  ? ?  ? ?SDOH Screenings  ? ?Alcohol Screen: Not on file  ?Depression (PHQ2-9): Not on file  ?Financial Resource Strain: Low Risk   ? Difficulty of Paying Living Expenses: Not very hard  ?Food Insecurity: No Food Insecurity  ? Worried About Charity fundraiser in the Last Year: Never true  ? Ran Out of Food in the Last Year: Never true  ?Housing: Not on file  ?Physical Activity: Not on file  ?Social Connections: Not on file   ?Stress: Not on file  ?Tobacco Use: Low Risk   ? Smoking Tobacco Use: Never  ? Smokeless  Tobacco Use: Never  ? Passive Exposure: Not on file  ?Transportation Needs: Not on file  ? ? ?[place] ? ?Allergies  ?Allergen Reactions  ? Bee Venom   ?  Throat swelling  ? Mushroom Extract Complex Anaphylaxis  ? Penicillins Anaphylaxis, Hives and Swelling  ?  Tolerates cephalosporins including cephalexin multiple times.  ?Has patient had a PCN reaction causing immediate rash, facial/tongue/throat swelling, SOB or lightheadedness with hypotension: Yes ?Has patient had a PCN reaction causing severe rash involving mucus membranes or skin necrosis: Yes ?Has patient had a PCN reaction that required hospitalization Yes ?Has patient had a PCN reaction occurring within the last 10 years: No ? ?  ? Codeine Nausea And Vomiting  ? Sulfa Antibiotics Nausea And Vomiting  ? Iohexol Itching and Swelling  ? Erythromycin Base Rash  ? ? ?Medications Reviewed Today   ? ? Reviewed by Charlton Haws, RPH (Pharmacist) on 06/18/21 at 1251  Med List Status: <None>  ? ?Medication Order Taking? Sig Documenting Provider Last Dose Status Informant  ?acetaminophen (TYLENOL) 500 MG tablet [id number] Yes Take 500 mg by mouth as needed for moderate pain. [provider] Taking Active   ?albuterol (PROAIR HFA) 108 (90 Base) MCG/ACT inhaler [id number] Yes Inhale 2 puffs into the lungs every 6 (six) hours as needed for wheezing or shortness of breath. [name], MD Taking Active   ?Aloe Vera 72 % CREA [id number] Yes Apply to burned areas four times daily. Maudie Flakes, MD Taking Active   ?atorvastatin (LIPITOR) 40 MG tablet [id number] Yes TAKE ONE TABLET BY MOUTH EVERY Recardo Evangelist, MD Taking Active   ?Blood Glucose Monitoring Suppl (ONE TOUCH ULTRA 2) w/Device KIT [id number] Yes Use as instructed to check blood sugar 2 times daily as needed. [name], MD Taking Active   ?clopidogrel (PLAVIX) 75 MG tablet  [id number] Yes Take 1 tablet (75 mg total) by mouth daily. Sueanne Margarita, MD Taking Active   ?Cyanocobalamin (VITAMIN B12) 500 MCG TABS [id number] Yes Take 1 tablet by mouth daily. [name], MD Taking Ac

## 2021-06-17 ENCOUNTER — Other Ambulatory Visit: Payer: Self-pay | Admitting: Family Medicine

## 2021-06-18 MED ORDER — FREESTYLE LIBRE 2 READER DEVI: 0 refills | Status: DC

## 2021-06-18 MED ORDER — FREESTYLE LIBRE 2 SENSOR MISC: 5 refills | Status: DC

## 2021-06-18 NOTE — Patient Instructions (Signed)
Visit Information ? ?Phone number for Pharmacist: [phone] ? ? Goals Addressed   ?None ?  ? ? ?Care Plan : [place]  ?Updates made by Charlton Haws, RPH since 06/18/2021 12:00 AM  ?  ? ?Problem: Diabetes and Depression   ?Priority: High  ?  ? ?Long-Range Goal: Disease Management   ?Start Date: 05/03/2020  ?Expected End Date: 06/19/2022  ?This Visit's Progress: On track  ?Priority: High  ?Note:   ?Current Barriers:  ?Unable to achieve control of diabetes  ?Unable to achieve control of depression ? ?Pharmacist Clinical Goal(s):  ?Patient will achieve adherence to monitoring guidelines and medication adherence to achieve therapeutic efficacy through collaboration with PharmD and provider.  ? ?Interventions: ?1:1 collaboration with [name], MD regarding development and update of comprehensive plan of care as evidenced by provider attestation and co-signature ?Inter-disciplinary care team collaboration (see longitudinal plan of care) ?Comprehensive medication review performed; medication list updated in electronic medical record ? ?Diabetes (A1c goal <7%) ?-Uncontrolled, improving - (A1c 8.3 01/2021); pt is non compliant with insulin, BG checks; she states "I'm never going to take insulin"; She does affirm taking her oral medications as prescribed. ?-Current home glucose readings - n/a (misplaced meter, no motivation to check BG) ?-Current medications: ?Farxiga 5 mg - 1 tablet daily - Appropriate, Effective, Safe, Accessible ?Metformin 500 mg ER - 2 tablets every morning - Appropriate, Effective, Safe, Accessible ?Glipizide 5 mg - 1 tablet BID - Appropriate, Effective, Safe, Accessible ?Lantus 10 units daily - not taking ?-Medications previously tried: Ozempic (never started) ?-Current meal patterns: poor appetite, only eating once/day ?-Discussed benefits of Freestyle Libre, pt would like to try it; consider trial of Ozempic again in future (cost should not be an issue with  LIS) ?-Recommended to start [employer]. Ordered to pharmacy. ? ?Depression/Anxiety (Goal: Improve symptoms) ?-Improving  - She has had good and bad days ?-History of auditory hallucinations  ?-PHQ9:  4 (06/06/20) - minimal depression ?-Current treatment: ?Duloxetine 30 mg daily - Appropriate, Effective, Safe, Accessible ?-Medications previously tried/failed: sertraline 2015 (75 mg) --> changed to duloxetine 30, up to 60 mg (2017-2018) --> changed to citalopram 2018 - unknown efficacy. There is note in chart that duloxetine was not effective and thus changed to citalopram. But then later placed back on duloxetine after a psych admission along with trazodone and Geodon. ?-Declines interest in CBT in the past. Reports this was not effective. ?-Recommend to continue current medication ? ?Patient Goals/Self-Care Activities ?Patient will:  ?- take medications as prescribed ?- follow up with health care providers  ?  ?  ? ?The patient verbalized understanding of instructions, educational materials, and care plan provided today and declined offer to receive copy of patient instructions, educational materials, and care plan.  ?Telephone follow up appointment with pharmacy team member scheduled for: 3 months ? ?Charlene Brooke, PharmD, BCACP ?Clinical Pharmacist ?[facility] ?[phone] ?  ?

## 2021-06-20 ENCOUNTER — Other Ambulatory Visit: Payer: Self-pay | Admitting: Cardiology

## 2021-06-20 ENCOUNTER — Other Ambulatory Visit: Payer: Self-pay | Admitting: Family Medicine

## 2021-06-20 NOTE — Telephone Encounter (Signed)
Refill request ?Last office visit 01/26/21 ?Upcoming appointment 06/29/21 ?Last refill Glipizide 12/23/20 #180/1 ?See allergy/contraindication ?Last refill Eliquis 12/23/20 #184/1 ?

## 2021-06-21 ENCOUNTER — Telehealth: Payer: Self-pay
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
## 2021-06-21 NOTE — Telephone Encounter (Signed)
ERx 

## 2021-06-21 NOTE — Chronic Care Management (AMB) (Signed)
? ? ?Chronic Care Management ?Pharmacy Assistant  ? ?Name: Maria Fox  MRN: [id number] DOB: 03-Oct-1945 ? ? ?Reason for Encounter: Medication Adherence and Delivery Coordination ?  ? ?Recent office visits:  ?None since last CCM contact ? ?Recent consult visits:  ?None since last CCM contact ? ?Hospital visits:  ?None in previous 6 months ? ?Medications: ?Outpatient Encounter Medications as of 06/21/2021  ?Medication Sig Note  ? acetaminophen (TYLENOL) 500 MG tablet Take 500 mg by mouth as needed for moderate pain.   ? albuterol (PROAIR HFA) 108 (90 Base) MCG/ACT inhaler Inhale 2 puffs into the lungs every 6 (six) hours as needed for wheezing or shortness of breath.   ? Aloe Vera 72 % CREA Apply to burned areas four times daily.   ? atorvastatin (LIPITOR) 40 MG tablet TAKE ONE TABLET BY MOUTH EVERY EVENING   ? Blood Glucose Monitoring Suppl (ONE TOUCH ULTRA 2) w/Device KIT Use as instructed to check blood sugar 2 times daily as needed.   ? clopidogrel (PLAVIX) 75 MG tablet Take 1 tablet (75 mg total) by mouth daily.   ? Continuous Blood Gluc Receiver (FREESTYLE LIBRE 2 READER) DEVI Use with sensors to monitor sugar continuously   ? Continuous Blood Gluc Sensor (FREESTYLE LIBRE 2 SENSOR) MISC Apply sensor every 14 days to monitor sugar continously   ? Cyanocobalamin (VITAMIN B12) 500 MCG TABS Take 1 tablet by mouth daily.   ? dapagliflozin propanediol (FARXIGA) 5 MG TABS tablet Take 5 mg by mouth daily.   ? docusate sodium (COLACE) 100 MG capsule Take 1 capsule (100 mg total) by mouth daily. (Patient taking differently: Take 100 mg by mouth daily as needed for mild constipation.) 07/01/2020: Takes as needed  ? doxycycline (VIBRAMYCIN) 100 MG capsule Take 1 capsule (100 mg total) by mouth 2 (two) times daily.   ? DULoxetine (CYMBALTA) 30 MG capsule Take 30 mg by mouth every morning.   ? ELIQUIS 5 MG TABS tablet TAKE ONE TABLET BY MOUTH TWICE DAILY   ? feeding supplement, GLUCERNA SHAKE, (GLUCERNA SHAKE) LIQD Take 237 mLs  by mouth 3 (three) times daily between meals.   ? furosemide (LASIX) 20 MG tablet Take 1 tablet (20 mg total) by mouth daily as needed for fluid or edema (leg swelling).   ? gabapentin (NEURONTIN) 300 MG capsule Take 1 capsule (300 mg total) by mouth 2 (two) times daily.   ? glipiZIDE (GLUCOTROL) 5 MG tablet TAKE ONE TABLET BY MOUTH TWICE DAILY   ? glucose blood (ONETOUCH ULTRA) test strip Use as instructed to check blood sugar 2 times daily as needed   ? isosorbide mononitrate (IMDUR) 30 MG 24 hr tablet TAKE 1/2 TABLET BY MOUTH ONCE DAILY Needs appointment for further refills   ? latanoprost (XALATAN) 0.005 % ophthalmic solution Place 1 drop into both eyes at bedtime.    ? metFORMIN (GLUCOPHAGE-XR) 500 MG 24 hr tablet TAKE TWO TABLETS BY MOUTH EVERY MORNING   ? metoCLOPramide (REGLAN) 10 MG tablet Take 1 tablet (10 mg total) by mouth 4 (four) times daily -  before meals and at bedtime.   ? metoprolol succinate (TOPROL XL) 25 MG 24 hr tablet Take 1 tablet (25 mg total) by mouth daily.   ? nitroGLYCERIN (NITROSTAT) 0.4 MG SL tablet Place 1 tablet (0.4 mg total) under the tongue every 5 (five) minutes x 3 doses as needed for chest pain.   ? ondansetron (ZOFRAN-ODT) 4 MG disintegrating tablet Take 1 tablet (4 mg total) by  mouth every 8 (eight) hours as needed for nausea or vomiting.   ? OneTouch Delica Lancets 56F MISC Use as directed to check blood sugar 2 times daily as needed   ? pantoprazole (PROTONIX) 40 MG tablet Take 1 tablet (40 mg total) by mouth 2 (two) times daily.   ? polyethylene glycol (MIRALAX / GLYCOLAX) 17 g packet Take 17 g by mouth daily as needed for moderate constipation.   ? ?No facility-administered encounter medications on file as of 06/21/2021.  ? ?BP Readings from Last 3 Encounters:  ?02/06/21 131/81  ?01/26/21 138/74  ?09/09/20 130/65  ?  ?Lab Results  ?Component Value Date  ? HGBA1C 8.3 (A) 01/26/2021  ?  ? ? ?No OVs, Consults, or hospital visits since last care coordination call / Pharmacist  visit. ?No medication changes indicated ? ? ?Last adherence delivery date:05/31/21     ? ?Patient is due for next adherence delivery on: 07/02/21 ? ?Spoke with patient on 06/21/21 reviewed medications and coordinated delivery. ? ?This delivery to include: Adherence Packaging  30 Days  ?Packs: ?Farxiga 5 mg - 1 tablet daily (1 breakfast) ?Pantoprazole 40 mg - 1 tablet twice daily (1 breakfast, 1 evening meal) ?Gabapentin 300 mg- 1 tablet twice daily (1 breakfast, 1 evening meal) ?Duloxetine 30 mg - 1 capsule daily (1 breakfast) ?Atorvastatin 40 mg - 1 tablet daily (1 evening meal) ?Isosorbide Mononitrate 30 mg - 1/2 tablet daily (1/2 breakfast) ?Glipizide 5 mg - 1 tablet twice daily (1 breakfast, 1 evening meal) ?Metformin 500 mg ER - 2 tablets ( breakfast) ?Eliquis 5 mg- 1 tablet twice daily (1 breakfast, 1 evening meal) ?Metoprolol succinate 25 mg ER - 1 tablet daily  (breakfast) ?Clopidogrel 4m - Take 1 tablet daily ( breakfast) ? ?VIAL medications: ?Latanoprost 0.005 % eye drops - 1 drop in each eye daily ?Ondansetron 432m take 1 tablet every 8 hours as needed for nausea ? ?Patient declined the following medications this month: ?Furosemide 20 mg - 1 tablet daily (breakfast) as needed ?Vitamin B12-  50072mPatient [facility]? ?Any concerns about your medications? No  The patient [facility] and will read and learn over the next few days ? ?How often do you forget or accidentally miss a dose? Never ? ?Is patient in packaging Yes ? What is the date on your next pill pack?Not home to read package ? Any concerns or issues with your packaging? Very happy with the packaging process ? ? ?Refills requested from providers include:Eliquis,Farxiga,duloxetine,glipizide, metformin XR,isosorbide mono,Zofran ? ?Confirmed delivery date of 07/02/21, advised patient that pharmacy will contact them the morning of delivery. ? ?Recent blood pressure readings are as follows:none  available ? ? ?Recent blood glucose readings are as follows: none available -just picked up freestyle libre ? ? ? ?Annual wellness visit in last year? Yes ?Most Recent BP reading:131/81  94-P  02/06/21   ? ?If Diabetic: ?Most recent A1C reading: 8.3% ?Last eye exam / retinopathy screening: UTD ?Last diabetic foot exam: 04/17/21 ? ?Maria BrookePP notified ? ?Maria Fox, CCMA ?Health concierge  ?[phone]

## 2021-06-25 NOTE — Telephone Encounter (Signed)
Patient called back and I got her scheduled ?

## 2021-06-29 ENCOUNTER — Ambulatory Visit: Payer: Medicare Other | Admitting: Family Medicine

## 2021-07-04 ENCOUNTER — Ambulatory Visit (INDEPENDENT_AMBULATORY_CARE_PROVIDER_SITE_OTHER): Payer: Medicare Other

## 2021-07-04 ENCOUNTER — Ambulatory Visit (HOSPITAL_COMMUNITY)
Admission: EM | Admit: 2021-07-04 | Discharge: 2021-07-04 | Disposition: A | Discharge status: Home / Self Care | Payer: Medicare Other | Attending: Family Medicine | Admitting: Family Medicine

## 2021-07-04 ENCOUNTER — Encounter (HOSPITAL_COMMUNITY): Payer: Self-pay | Admitting: Emergency Medicine

## 2021-07-04 DIAGNOSIS — S62647A Nondisplaced fracture of proximal phalanx of left little finger, initial encounter for closed fracture: Secondary | ICD-10-CM

## 2021-07-04 DIAGNOSIS — S62617A Displaced fracture of proximal phalanx of left little finger, initial encounter for closed fracture: Secondary | ICD-10-CM | POA: Diagnosis not present

## 2021-07-04 NOTE — Discharge Instructions (Signed)
If not allergic, you may use over the counter ibuprofen or acetaminophen as needed. ?Wear your splint until your orthopaedic follow up. ? ?

## 2021-07-04 NOTE — ED Notes (Signed)
Pt reports shut left hand in car door last night. Reports pain and bruising.  ?
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
## 2021-07-08 DIAGNOSIS — F332 Major depressive disorder, recurrent severe without psychotic features: Secondary | ICD-10-CM | POA: Diagnosis not present

## 2021-07-08 DIAGNOSIS — E1149 Type 2 diabetes mellitus with other diabetic neurological complication: Secondary | ICD-10-CM | POA: Diagnosis not present

## 2021-07-09 ENCOUNTER — Telehealth: Payer: Self-pay

## 2021-07-09 NOTE — Chronic Care Management (AMB) (Signed)
? ? ?Chronic Care Management ?Pharmacy Assistant  ? ?Name: Maria Fox  MRN: [id number] DOB: Sep 25, 1945 ? ? ?Reason for Encounter:Diabetes Disease State ?  ? ?Recent office visits:  ?None since last CCM contact ? ?Recent consult visits:  ?07/04/21-Brian Hagler,MD-[facility]-nondisplaced fx left 5th digit-ulnar gutter splint-tylenol prn pain  ? ?Hospital visits:  ?None in previous 6 months ? ?Medications: ?Outpatient Encounter Medications as of 07/09/2021  ?Medication Sig Note  ? acetaminophen (TYLENOL) 500 MG tablet Take 500 mg by mouth as needed for moderate pain.   ? albuterol (PROAIR HFA) 108 (90 Base) MCG/ACT inhaler Inhale 2 puffs into the lungs every 6 (six) hours as needed for wheezing or shortness of breath.   ? Aloe Vera 72 % CREA Apply to burned areas four times daily.   ? atorvastatin (LIPITOR) 40 MG tablet TAKE ONE TABLET BY MOUTH EVERY EVENING   ? Blood Glucose Monitoring Suppl (ONE TOUCH ULTRA 2) w/Device KIT Use as instructed to check blood sugar 2 times daily as needed.   ? clopidogrel (PLAVIX) 75 MG tablet Take 1 tablet (75 mg total) by mouth daily.   ? Continuous Blood Gluc Receiver (FREESTYLE LIBRE 2 READER) DEVI Use with sensors to monitor sugar continuously   ? Continuous Blood Gluc Sensor (FREESTYLE LIBRE 2 SENSOR) MISC Apply sensor every 14 days to monitor sugar continously   ? Cyanocobalamin (VITAMIN B12) 500 MCG TABS Take 1 tablet by mouth daily.   ? docusate sodium (COLACE) 100 MG capsule Take 1 capsule (100 mg total) by mouth daily. (Patient taking differently: Take 100 mg by mouth daily as needed for mild constipation.) 07/01/2020: Takes as needed  ? doxycycline (VIBRAMYCIN) 100 MG capsule Take 1 capsule (100 mg total) by mouth 2 (two) times daily.   ? DULoxetine (CYMBALTA) 30 MG capsule TAKE ONE CAPSULE BY MOUTH EVERY MORNING   ? ELIQUIS 5 MG TABS tablet TAKE ONE TABLET BY MOUTH TWICE DAILY   ? FARXIGA 5 MG TABS tablet TAKE ONE TABLET BY MOUTH EVERY MORNING   ? feeding supplement,  GLUCERNA SHAKE, (GLUCERNA SHAKE) LIQD Take 237 mLs by mouth 3 (three) times daily between meals.   ? furosemide (LASIX) 20 MG tablet Take 1 tablet (20 mg total) by mouth daily as needed for fluid or edema (leg swelling).   ? gabapentin (NEURONTIN) 300 MG capsule Take 1 capsule (300 mg total) by mouth 2 (two) times daily.   ? glipiZIDE (GLUCOTROL) 5 MG tablet TAKE ONE TABLET BY MOUTH TWICE DAILY   ? glucose blood (ONETOUCH ULTRA) test strip Use as instructed to check blood sugar 2 times daily as needed   ? isosorbide mononitrate (IMDUR) 30 MG 24 hr tablet TAKE 1/2 TABLET BY MOUTH ONCE DAILY Needs appointment for further refills   ? latanoprost (XALATAN) 0.005 % ophthalmic solution Place 1 drop into both eyes at bedtime.    ? metFORMIN (GLUCOPHAGE-XR) 500 MG 24 hr tablet TAKE TWO TABLETS BY MOUTH EVERY MORNING   ? metoCLOPramide (REGLAN) 10 MG tablet Take 1 tablet (10 mg total) by mouth 4 (four) times daily -  before meals and at bedtime.   ? metoprolol succinate (TOPROL XL) 25 MG 24 hr tablet Take 1 tablet (25 mg total) by mouth daily.   ? nitroGLYCERIN (NITROSTAT) 0.4 MG SL tablet Place 1 tablet (0.4 mg total) under the tongue every 5 (five) minutes x 3 doses as needed for chest pain.   ? ondansetron (ZOFRAN-ODT) 4 MG disintegrating tablet Take 1 tablet (  4 mg total) by mouth every 8 (eight) hours as needed for nausea or vomiting.   ? OneTouch Delica Lancets 23R MISC Use as directed to check blood sugar 2 times daily as needed   ? pantoprazole (PROTONIX) 40 MG tablet Take 1 tablet (40 mg total) by mouth 2 (two) times daily.   ? polyethylene glycol (MIRALAX / GLYCOLAX) 17 g packet Take 17 g by mouth daily as needed for moderate constipation.   ? ?No facility-administered encounter medications on file as of 07/09/2021.  ? ? ? ?Recent Relevant Labs: ?Lab Results  ?Component Value Date/Time  ? HGBA1C 8.3 (A) 01/26/2021 03:50 PM  ? HGBA1C 11.6 (H) 07/03/2020 09:37 AM  ? HGBA1C 12.2 (H) 05/02/2020 03:25 PM  ? MICROALBUR <0.7  05/02/2020 03:25 PM  ? MICROALBUR <0.7 11/08/2019 05:08 PM  ?  ?Kidney Function ?Lab Results  ?Component Value Date/Time  ? CREATININE 1.45 (H) 01/26/2021 04:25 PM  ? CREATININE 0.92 08/01/2020 01:01 AM  ? CREATININE 0.95 07/31/2020 01:56 AM  ? GFR 37.02 (L) 05/02/2020 03:25 PM  ? GFRNONAA >60 08/01/2020 01:01 AM  ? GFRAA 41 (L) 01/28/2020 03:32 PM  ? ? ? ?Contacted patient on 07/10/21 to discuss diabetes disease state.  ? ?Current antihyperglycemic regimen:  ?Farxiga 5 mg - 1 tablet daily  ?Metformin 500 mg ER - 2 tablets every morning  ?Glipizide 5 mg - 1 tablet BID  ? ?  Patient verbally confirms she is taking the above medications as directed. Yes ? ?What diet changes have been made to improve diabetes control? ? ?What recent interventions/DTPs have been made to improve glycemic control:  ?Recommended to start [facility]. Ordered to pharmacy.- patient just received late and has not started the system, she will contact us for any questions  ? ?Have there been any recent hospitalizations or ED visits since last visit with CPP? No ? ?Patient denies hypoglycemic symptoms, including Pale, Sweaty, Shaky, Hungry, Nervous/irritable, and Vision changes ? ?Patient denies hyperglycemic symptoms, including blurry vision, excessive thirst, fatigue, polyuria, and weakness ? ?How often are you checking your blood sugar?  The patient is not checking, just received the [facility] system ? ?What are your blood sugars ranging? None available  ? ? ?Are you checking your feet daily/regularly? Yes ? ?Adherence Review: ?Is the patient currently on a STATIN medication? Yes ?Is the patient currently on ACE/ARB medication? No ?Does the patient have >5 day gap between last estimated fill dates? No ? ?Care Gaps: ?Annual wellness visit in last year? Yes ?Most recent A1C reading:8.3 ?Most Recent BP reading:138/74 68-P  ? ?Last eye exam / retinopathy screening:UTD ?Last diabetic foot exam:UTD ? ?Counseled patient on importance of  annual eye and foot exam. UTD ? ?Star Rating Drugs:  ?Medication:  Last Fill: Day Supply ?Atorvastatin 33m 06/25/21 30 ?Glipizide 570m 06/25/21 30 ?Metformin 50020m/17/23 30 ?Farxiga 5mg59m/17/23 30 ? ?PCP appointment on 09/07/21 Labs  and CCM appointment on 09/13/21 ? ?LindCharlene BrookeP notified ? ?Posey Jasmin, CCMA ?Health concierge  ?[phone]

## 2021-07-12 DIAGNOSIS — S62647A Nondisplaced fracture of proximal phalanx of left little finger, initial encounter for closed fracture: Secondary | ICD-10-CM | POA: Diagnosis not present

## 2021-07-16 ENCOUNTER — Ambulatory Visit (INDEPENDENT_AMBULATORY_CARE_PROVIDER_SITE_OTHER): Payer: Self-pay | Admitting: Podiatry

## 2021-07-16 DIAGNOSIS — Z91199 Patient's noncompliance with other medical treatment and regimen due to unspecified reason: Secondary | ICD-10-CM

## 2021-07-17 NOTE — Progress Notes (Signed)
   Complete physical exam  Patient: Maria Fox   DOB: 12/29/2000   76 y.o. Female  MRN: [id number]  Subjective:    No chief complaint on file.   Maria Fox is a 76 y.o. female who presents today for a complete physical exam. She reports consuming a {diet types:17450} diet. {types:19826} She generally feels {DESC; WELL/FAIRLY WELL/POORLY:18703}. She reports sleeping {DESC; WELL/FAIRLY WELL/POORLY:18703}. She {does/does not:200015} have additional problems to discuss today.    Most recent fall risk assessment:    09/05/2021   10:42 AM  Fall Risk   Falls in the past year? 0  Number falls in past yr: 0  Injury with Fall? 0  Risk for fall due to : No Fall Risks  Follow up Falls evaluation completed     Most recent depression screenings:    09/05/2021   10:42 AM 07/27/2020   10:46 AM  PHQ 2/9 Scores  PHQ - 2 Score 0 0  PHQ- 9 Score 5     {VISON DENTAL STD PSA (Optional):27386}  {History (Optional):23778}  Patient Care Team: Jessup, Joy, NP as PCP - General (Nurse Practitioner)   Outpatient Medications Prior to Visit  Medication Sig   fluticasone (FLONASE) 50 MCG/ACT nasal spray Place 2 sprays into both nostrils in the morning and at bedtime. After 7 days, reduce to once daily.   norgestimate-ethinyl estradiol (SPRINTEC 28) 0.25-35 MG-MCG tablet Take 1 tablet by mouth daily.   Nystatin POWD Apply liberally to affected area 2 times per day   spironolactone (ALDACTONE) 100 MG tablet Take 1 tablet (100 mg total) by mouth daily.   No facility-administered medications prior to visit.    ROS        Objective:     There were no vitals taken for this visit. {Vitals History (Optional):23777}  Physical Exam   No results found for any visits on 10/11/21. {Show previous labs (optional):23779}    Assessment & Plan:    Routine Health Maintenance and Physical Exam  Immunization History  Administered Date(s) Administered   DTaP 03/14/1999, 05/10/1999,  07/19/1999, 04/03/2000, 10/18/2003   Hepatitis A 08/14/2007, 08/19/2008   Hepatitis B 12/30/1998, 02/06/1999, 07/19/1999   HiB (PRP-OMP) 03/14/1999, 05/10/1999, 07/19/1999, 04/03/2000   IPV 03/14/1999, 05/10/1999, 01/07/2000, 10/18/2003   Influenza,inj,Quad PF,6+ Mos 11/19/2013   Influenza-Unspecified 02/19/2012   MMR 01/06/2001, 10/18/2003   Meningococcal Polysaccharide 08/19/2011   Pneumococcal Conjugate-13 04/03/2000   Pneumococcal-Unspecified 07/19/1999, 10/02/1999   Tdap 08/19/2011   Varicella 01/07/2000, 08/14/2007    Health Maintenance  Topic Date Due   HIV Screening  Never done   Hepatitis C Screening  Never done   INFLUENZA VACCINE  10/09/2021   PAP-Cervical Cytology Screening  10/11/2021 (Originally 12/29/2019)   PAP SMEAR-Modifier  10/11/2021 (Originally 12/29/2019)   TETANUS/TDAP  10/11/2021 (Originally 08/18/2021)   HPV VACCINES  Discontinued   COVID-19 Vaccine  Discontinued    Discussed health benefits of physical activity, and encouraged her to engage in regular exercise appropriate for her age and condition.  Problem List Items Addressed This Visit   None Visit Diagnoses     Annual physical exam    -  Primary   Cervical cancer screening       Need for Tdap vaccination          No follow-ups on file.     Joy Jessup, NP   

## 2021-07-18 ENCOUNTER — Other Ambulatory Visit: Payer: Self-pay | Admitting: Family Medicine

## 2021-07-18 DIAGNOSIS — E785 Hyperlipidemia, unspecified: Secondary | ICD-10-CM

## 2021-07-19 ENCOUNTER — Telehealth: Payer: Self-pay

## 2021-07-19 NOTE — Chronic Care Management (AMB) (Signed)
? ? ?Chronic Care Management ?Pharmacy Assistant  ? ?Name: Maria Fox  MRN: [id number] DOB: [birth year] ? ? ?Reason for Encounter: Medication Adherence and Delivery Coordination  ?  ? ?Recent office visits:  ?None since last CCM contact ? ?Recent consult visits:  ?07/12/21-Fred Ortmann,MD(orthopedic) no data found  ?07/04/21-[facility]-fracture left little finger-splint,tylenol as needed for pain  f/u orthopedics ? ?Hospital visits:  None in 6 months ? ?Medications: ?Outpatient Encounter Medications as of 07/19/2021  ?Medication Sig Note  ? acetaminophen (TYLENOL) 500 MG tablet Take 500 mg by mouth as needed for moderate pain.   ? albuterol (PROAIR HFA) 108 (90 Base) MCG/ACT inhaler Inhale 2 puffs into the lungs every 6 (six) hours as needed for wheezing or shortness of breath.   ? Aloe Vera 72 % CREA Apply to burned areas four times daily.   ? atorvastatin (LIPITOR) 40 MG tablet TAKE ONE TABLET BY MOUTH EVERY EVENING   ? Blood Glucose Monitoring Suppl (ONE TOUCH ULTRA 2) w/Device KIT Use as instructed to check blood sugar 2 times daily as needed.   ? clopidogrel (PLAVIX) 75 MG tablet Take 1 tablet (75 mg total) by mouth daily.   ? Continuous Blood Gluc Receiver (FREESTYLE LIBRE 2 READER) DEVI Use with sensors to monitor sugar continuously   ? Continuous Blood Gluc Sensor (FREESTYLE LIBRE 2 SENSOR) MISC Apply sensor every 14 days to monitor sugar continously   ? Cyanocobalamin (VITAMIN B12) 500 MCG TABS Take 1 tablet by mouth daily.   ? docusate sodium (COLACE) 100 MG capsule Take 1 capsule (100 mg total) by mouth daily. (Patient taking differently: Take 100 mg by mouth daily as needed for mild constipation.) 07/01/2020: Takes as needed  ? doxycycline (VIBRAMYCIN) 100 MG capsule Take 1 capsule (100 mg total) by mouth 2 (two) times daily.   ? DULoxetine (CYMBALTA) 30 MG capsule TAKE ONE CAPSULE BY MOUTH EVERY MORNING   ? ELIQUIS 5 MG TABS tablet TAKE ONE TABLET BY MOUTH TWICE DAILY   ? FARXIGA 5 MG TABS tablet TAKE  ONE TABLET BY MOUTH EVERY MORNING   ? feeding supplement, GLUCERNA SHAKE, (GLUCERNA SHAKE) LIQD Take 237 mLs by mouth 3 (three) times daily between meals.   ? furosemide (LASIX) 20 MG tablet Take 1 tablet (20 mg total) by mouth daily as needed for fluid or edema (leg swelling).   ? gabapentin (NEURONTIN) 300 MG capsule Take 1 capsule (300 mg total) by mouth 2 (two) times daily.   ? glipiZIDE (GLUCOTROL) 5 MG tablet TAKE ONE TABLET BY MOUTH TWICE DAILY   ? glucose blood (ONETOUCH ULTRA) test strip Use as instructed to check blood sugar 2 times daily as needed   ? isosorbide mononitrate (IMDUR) 30 MG 24 hr tablet TAKE 1/2 TABLET BY MOUTH ONCE DAILY Needs appointment for further refills   ? latanoprost (XALATAN) 0.005 % ophthalmic solution Place 1 drop into both eyes at bedtime.    ? metFORMIN (GLUCOPHAGE-XR) 500 MG 24 hr tablet TAKE TWO TABLETS BY MOUTH EVERY MORNING   ? metoCLOPramide (REGLAN) 10 MG tablet Take 1 tablet (10 mg total) by mouth 4 (four) times daily -  before meals and at bedtime.   ? metoprolol succinate (TOPROL XL) 25 MG 24 hr tablet Take 1 tablet (25 mg total) by mouth daily.   ? nitroGLYCERIN (NITROSTAT) 0.4 MG SL tablet Place 1 tablet (0.4 mg total) under the tongue every 5 (five) minutes x 3 doses as needed for chest pain.   ?  ondansetron (ZOFRAN-ODT) 4 MG disintegrating tablet Take 1 tablet (4 mg total) by mouth every 8 (eight) hours as needed for nausea or vomiting.   ? OneTouch Delica Lancets 22E MISC Use as directed to check blood sugar 2 times daily as needed   ? pantoprazole (PROTONIX) 40 MG tablet Take 1 tablet (40 mg total) by mouth 2 (two) times daily.   ? polyethylene glycol (MIRALAX / GLYCOLAX) 17 g packet Take 17 g by mouth daily as needed for moderate constipation.   ? ?No facility-administered encounter medications on file as of 07/19/2021.  ? ?BP Readings from Last 3 Encounters:  ?07/04/21 (!) 157/83  ?02/06/21 131/81  ?01/26/21 138/74  ?  ?Lab Results  ?Component Value Date  ?  HGBA1C 8.3 (A) 01/26/2021  ?  ? ? ?Recent OV, Consult or Hospital visit:   Orthopedic for small finger fracture  ?No medication changes indicated ? ? ?Last adherence delivery date:07/02/21     ? ?Patient is due for next adherence delivery on: 07/31/21 ? ?Multiple attempts made to reach patient. Unsuccessful outreach. Will refill based off of last adherence fill.  ? ?This delivery to include: Adherence Packaging  30 Days  ?Packs: ?Farxiga 5 mg - 1 tablet daily (1 breakfast) ?Pantoprazole 40 mg - 1 tablet twice daily (1 breakfast, 1 evening meal) ?Gabapentin 300 mg- 1 tablet twice daily (1 breakfast, 1 evening meal) ?Duloxetine 30 mg - 1 capsule daily (1 breakfast) ?Atorvastatin 40 mg - 1 tablet daily (1 evening meal) ?Isosorbide Mononitrate 30 mg - 1/2 tablet daily (1/2 breakfast) ?Glipizide 5 mg - 1 tablet twice daily (1 breakfast, 1 evening meal) ?Metformin 500 mg ER - 2 tablets ( breakfast) ?Eliquis 5 mg- 1 tablet twice daily (1 breakfast, 1 evening meal) ?Metoprolol succinate 25 mg ER - 1 tablet daily  (breakfast) ?Clopidogrel 28m - Take 1 tablet daily ( breakfast) ? ?VIAL medications: ?Latanoprost 0.005 % eye drops - 1 drop in each eye daily ?Freestyle libre sensors ? ? ?Patient declined the following medications this month: ?Ondansetron 468m take 1 tablet every 8 hours as needed for nausea ?Furosemide 20 mg - 1 tablet daily (breakfast) as needed ?Vitamin B12-  50062mPatient [place] [facility] [place] ? ?Refills requested from providers include: atorvastatin,latanoprost,farxiga, isosorbide  ? ?Delivery scheduled for 07/31/21. Unable to speak with patient to confirm date.  ? ? ?Annual wellness visit in last year? Yes ?Most Recent BP reading:131/81  94-P ? ?If Diabetic: ?Most recent A1C reading: 8.3% ?Last eye exam / retinopathy screening: UTD ?Last diabetic foot exam: 04/17/21 ? ?CCM appt. 09/13/21 ? ?LinCharlene BrookePP notified ? ?Xochil Shanker, CCMA ?Health concierge  ?[phone]

## 2021-07-20 ENCOUNTER — Telehealth: Payer: Self-pay | Admitting: Family Medicine

## 2021-07-20 NOTE — Telephone Encounter (Signed)
Left message for patient to call back and schedule Medicare Annual Wellness Visit (AWV) to be completed by video or phone.  ? ?Last AWV: 06/06/2020 ? ?Please schedule at anytime with  ?[facility]    ? ?45 minute appointment ? ?Any questions, please contact me at [phone]  ?  ?  ?

## 2021-07-27 ENCOUNTER — Other Ambulatory Visit: Payer: Self-pay | Admitting: Family Medicine

## 2021-07-27 NOTE — Telephone Encounter (Signed)
Refill request Zofran Last office visit 01/26/21 #30 Upcoming appointment 09/14/21

## 2021-08-03 ENCOUNTER — Telehealth: Payer: Self-pay

## 2021-08-03 NOTE — Progress Notes (Signed)
    Chronic Care Management Pharmacy Assistant   Name: ISATU MACINNES  MRN: [id number] DOB: 07-15-1945  Reason for Encounter: CCM (Reschedule Appointment)   Patient was contact to reschedule their CCM appointment with Charlene Brooke.  Patient has been reschedule for 10/04/2021 at 3:45.  Charlene Brooke, CPP notified  Marijean Niemann, [place] Clinical Pharmacy Assistant [phone]

## 2021-08-16 ENCOUNTER — Other Ambulatory Visit: Payer: Self-pay | Admitting: Family Medicine

## 2021-08-16 NOTE — Telephone Encounter (Signed)
Gabapentin Last filled:  07/25/21, #180 Last OV:  01/26/21, f/u Next OV:  09/14/21, CPE

## 2021-08-20 ENCOUNTER — Telehealth: Payer: Self-pay

## 2021-08-20 NOTE — Chronic Care Management (AMB) (Signed)
Chronic Care Management Pharmacy Assistant   Name: Maria Fox  MRN: [id number] DOB: [birth year]   Reason for Encounter: Medication Adherence and Delivery Coordination     Recent office visits:  None since last CCM contact   Recent consult visits:  None since last CCM contact   Hospital visits:  None in previous 6 months  Medications: Outpatient Encounter Medications as of 08/20/2021  Medication Sig Note   acetaminophen (TYLENOL) 500 MG tablet Take 500 mg by mouth as needed for moderate pain.    albuterol (PROAIR HFA) 108 (90 Base) MCG/ACT inhaler Inhale 2 puffs into the lungs every 6 (six) hours as needed for wheezing or shortness of breath.    Aloe Vera 72 % CREA Apply to burned areas four times daily.    atorvastatin (LIPITOR) 40 MG tablet TAKE ONE TABLET BY MOUTH EVERY EVENING    Blood Glucose Monitoring Suppl (ONE TOUCH ULTRA 2) w/Device KIT Use as instructed to check blood sugar 2 times daily as needed.    clopidogrel (PLAVIX) 75 MG tablet Take 1 tablet (75 mg total) by mouth daily.    Continuous Blood Gluc Receiver (FREESTYLE LIBRE 2 READER) DEVI Use with sensors to monitor sugar continuously    Continuous Blood Gluc Sensor (FREESTYLE LIBRE 2 SENSOR) MISC Apply sensor every 14 days to monitor sugar continously    Cyanocobalamin (VITAMIN B12) 500 MCG TABS Take 1 tablet by mouth daily.    docusate sodium (COLACE) 100 MG capsule Take 1 capsule (100 mg total) by mouth daily. (Patient taking differently: Take 100 mg by mouth daily as needed for mild constipation.) 07/01/2020: Takes as needed   doxycycline (VIBRAMYCIN) 100 MG capsule Take 1 capsule (100 mg total) by mouth 2 (two) times daily.    DULoxetine (CYMBALTA) 30 MG capsule TAKE ONE CAPSULE BY MOUTH EVERY MORNING    ELIQUIS 5 MG TABS tablet TAKE ONE TABLET BY MOUTH TWICE DAILY    FARXIGA 5 MG TABS tablet TAKE ONE TABLET BY MOUTH EVERY MORNING    feeding supplement, GLUCERNA SHAKE, (GLUCERNA SHAKE) LIQD Take 237 mLs by  mouth 3 (three) times daily between meals.    furosemide (LASIX) 20 MG tablet Take 1 tablet (20 mg total) by mouth daily as needed for fluid or edema (leg swelling).    gabapentin (NEURONTIN) 300 MG capsule TAKE ONE CAPSULE BY MOUTH TWICE DAILY    glipiZIDE (GLUCOTROL) 5 MG tablet TAKE ONE TABLET BY MOUTH TWICE DAILY    glucose blood (ONETOUCH ULTRA) test strip Use as instructed to check blood sugar 2 times daily as needed    isosorbide mononitrate (IMDUR) 30 MG 24 hr tablet TAKE 1/2 TABLET BY MOUTH ONCE DAILY Needs appointment for further refills    latanoprost (XALATAN) 0.005 % ophthalmic solution Place 1 drop into both eyes at bedtime.     metFORMIN (GLUCOPHAGE-XR) 500 MG 24 hr tablet TAKE TWO TABLETS BY MOUTH EVERY MORNING    metoCLOPramide (REGLAN) 10 MG tablet Take 1 tablet (10 mg total) by mouth 4 (four) times daily -  before meals and at bedtime.    metoprolol succinate (TOPROL-XL) 25 MG 24 hr tablet TAKE ONE TABLET BY MOUTH ONCE DAILY    nitroGLYCERIN (NITROSTAT) 0.4 MG SL tablet Place 1 tablet (0.4 mg total) under the tongue every 5 (five) minutes x 3 doses as needed for chest pain.    ondansetron (ZOFRAN-ODT) 4 MG disintegrating tablet TAKE ONE TABLET BY MOUTH every EIGHT hours AS NEEDED FOR NAUSEA AND  VOMITING    OneTouch Delica Lancets 46O MISC Use as directed to check blood sugar 2 times daily as needed    pantoprazole (PROTONIX) 40 MG tablet TAKE ONE TABLET BY MOUTH TWICE DAILY    polyethylene glycol (MIRALAX / GLYCOLAX) 17 g packet Take 17 g by mouth daily as needed for moderate constipation.    No facility-administered encounter medications on file as of 08/20/2021.   BP Readings from Last 3 Encounters:  07/04/21 (!) 157/83  02/06/21 131/81  01/26/21 138/74    Lab Results  Component Value Date   HGBA1C 8.3 (A) 01/26/2021      No OVs, Consults, or hospital visits since last care coordination call / Pharmacist visit. No medication changes indicated   Last adherence  delivery date:07/31/21      Patient is due for next adherence delivery on: 08/29/21  Multiple attempts made to reach patient. Unsuccessful outreach. Will refill based off of last adherence fill.   This delivery to include: Adherence Packaging  30 Days  Packs: Farxiga 5 mg - 1 tablet daily (1 breakfast) Pantoprazole 40 mg - 1 tablet twice daily (1 breakfast, 1 evening meal) Gabapentin 300 mg- 1 tablet twice daily (1 breakfast, 1 evening meal) Duloxetine 30 mg - 1 capsule daily (1 breakfast) Atorvastatin 40 mg - 1 tablet daily (1 evening meal) Isosorbide Mononitrate 30 mg - 1/2 tablet daily (1/2 breakfast) Glipizide 5 mg - 1 tablet twice daily (1 breakfast, 1 evening meal) Metformin 500 mg ER - 2 tablets ( breakfast) Eliquis 5 mg- 1 tablet twice daily (1 breakfast, 1 evening meal) Metoprolol succinate 25 mg ER - 1 tablet daily  (breakfast) Clopidogrel 15m - Take 1 tablet daily ( breakfast)  VIAL medications: Latanoprost 0.005 % eye drops - 1 drop in each eye daily Freestyle libre sensors Ondansetron 455m take 1 tablet every 8 hours    Refills requested from providers include: metoprolol,gabapentin,pantoprazole,ondansetron,latanoprost  Delivery scheduled for 08/29/21. Unable to speak with patient to confirm date.     Annual wellness visit in last year? Yes Most Recent BP reading:131/81  If Diabetic: Most recent A1C reading:8.3 Last eye exam / retinopathy screening:UTD Last diabetic foot exam:UTD  LiCharlene BrookeCPP notified  [place]vel SensorCC[place]on [phone]

## 2021-08-23 ENCOUNTER — Other Ambulatory Visit: Payer: Self-pay | Admitting: Family Medicine

## 2021-08-24 NOTE — Telephone Encounter (Signed)
Refill request Zofran Last refill 07/27/21 #30 Last office visit 01/26/21 Upcoming appointment 09/14/21

## 2021-09-02 ENCOUNTER — Other Ambulatory Visit: Payer: Self-pay | Admitting: Family Medicine

## 2021-09-02 DIAGNOSIS — E1169 Type 2 diabetes mellitus with other specified complication: Secondary | ICD-10-CM

## 2021-09-02 DIAGNOSIS — E611 Iron deficiency: Secondary | ICD-10-CM

## 2021-09-02 DIAGNOSIS — M85859 Other specified disorders of bone density and structure, unspecified thigh: Secondary | ICD-10-CM

## 2021-09-02 DIAGNOSIS — E1149 Type 2 diabetes mellitus with other diabetic neurological complication: Secondary | ICD-10-CM

## 2021-09-02 DIAGNOSIS — E538 Deficiency of other specified B group vitamins: Secondary | ICD-10-CM

## 2021-09-02 DIAGNOSIS — I10 Essential (primary) hypertension: Secondary | ICD-10-CM

## 2021-09-07 ENCOUNTER — Other Ambulatory Visit: Payer: Medicare Other

## 2021-09-13 ENCOUNTER — Telehealth: Payer: Medicare Other

## 2021-09-14 ENCOUNTER — Encounter: Payer: Medicare Other | Admitting: Family Medicine

## 2021-09-17 ENCOUNTER — Other Ambulatory Visit: Payer: Self-pay | Admitting: Cardiology

## 2021-09-17 ENCOUNTER — Other Ambulatory Visit: Payer: Self-pay | Admitting: Family Medicine

## 2021-09-18 ENCOUNTER — Telehealth: Payer: Self-pay

## 2021-09-18 NOTE — Chronic Care Management (AMB) (Cosign Needed Addendum)
Chronic Care Management Pharmacy Assistant   Name: Maria Fox  MRN: [id number] DOB: [birth year]  Reason for Encounter: Medication Adherence and Delivery Coordination   Recent office visits:  None since last CCM contact   Recent consult visits:  None since last CCM contact   Hospital visits:  None in previous 6 months  Medications: Outpatient Encounter Medications as of 09/18/2021  Medication Sig Note   acetaminophen (TYLENOL) 500 MG tablet Take 500 mg by mouth as needed for moderate pain.    albuterol (PROAIR HFA) 108 (90 Base) MCG/ACT inhaler Inhale 2 puffs into the lungs every 6 (six) hours as needed for wheezing or shortness of breath.    Aloe Vera 72 % CREA Apply to burned areas four times daily.    atorvastatin (LIPITOR) 40 MG tablet TAKE ONE TABLET BY MOUTH EVERY EVENING    Blood Glucose Monitoring Suppl (ONE TOUCH ULTRA 2) w/Device KIT Use as instructed to check blood sugar 2 times daily as needed.    clopidogrel (PLAVIX) 75 MG tablet Take 1 tablet (75 mg total) by mouth daily.    Continuous Blood Gluc Receiver (FREESTYLE LIBRE 2 READER) DEVI Use with sensors to monitor sugar continuously    Continuous Blood Gluc Sensor (FREESTYLE LIBRE 2 SENSOR) MISC Apply sensor every 14 days to monitor sugar continously    Cyanocobalamin (VITAMIN B12) 500 MCG TABS Take 1 tablet by mouth daily.    docusate sodium (COLACE) 100 MG capsule Take 1 capsule (100 mg total) by mouth daily. (Patient taking differently: Take 100 mg by mouth daily as needed for mild constipation.) 07/01/2020: Takes as needed   doxycycline (VIBRAMYCIN) 100 MG capsule Take 1 capsule (100 mg total) by mouth 2 (two) times daily.    DULoxetine (CYMBALTA) 30 MG capsule TAKE ONE CAPSULE BY MOUTH EVERY MORNING    ELIQUIS 5 MG TABS tablet TAKE ONE TABLET BY MOUTH TWICE DAILY    FARXIGA 5 MG TABS tablet TAKE ONE TABLET BY MOUTH EVERY MORNING    feeding supplement, GLUCERNA SHAKE, (GLUCERNA SHAKE) LIQD Take 237 mLs by mouth 3  (three) times daily between meals.    furosemide (LASIX) 20 MG tablet Take 1 tablet (20 mg total) by mouth daily as needed for fluid or edema (leg swelling).    gabapentin (NEURONTIN) 300 MG capsule TAKE ONE CAPSULE BY MOUTH TWICE DAILY    glipiZIDE (GLUCOTROL) 5 MG tablet TAKE ONE TABLET BY MOUTH TWICE DAILY    glucose blood (ONETOUCH ULTRA) test strip Use as instructed to check blood sugar 2 times daily as needed    isosorbide mononitrate (IMDUR) 30 MG 24 hr tablet TAKE 1/2 TABLET BY MOUTH ONCE DAILY Needs appointment for further refills    latanoprost (XALATAN) 0.005 % ophthalmic solution Place 1 drop into both eyes at bedtime.     metFORMIN (GLUCOPHAGE-XR) 500 MG 24 hr tablet TAKE TWO TABLETS BY MOUTH EVERY MORNING    metoCLOPramide (REGLAN) 10 MG tablet Take 1 tablet (10 mg total) by mouth 4 (four) times daily -  before meals and at bedtime.    metoprolol succinate (TOPROL-XL) 25 MG 24 hr tablet TAKE ONE TABLET BY MOUTH ONCE DAILY    nitroGLYCERIN (NITROSTAT) 0.4 MG SL tablet Place 1 tablet (0.4 mg total) under the tongue every 5 (five) minutes x 3 doses as needed for chest pain.    ondansetron (ZOFRAN-ODT) 4 MG disintegrating tablet TAKE ONE TABLET BY MOUTH every EIGHT hours AS NEEDED FOR NAUSEA AND VOMITING  OneTouch Delica Lancets 81X MISC Use as directed to check blood sugar 2 times daily as needed    pantoprazole (PROTONIX) 40 MG tablet TAKE ONE TABLET BY MOUTH TWICE DAILY    polyethylene glycol (MIRALAX / GLYCOLAX) 17 g packet Take 17 g by mouth daily as needed for moderate constipation.    No facility-administered encounter medications on file as of 09/18/2021.    BP Readings from Last 3 Encounters:  07/04/21 (!) 157/83  02/06/21 131/81  01/26/21 138/74    Lab Results  Component Value Date   HGBA1C 8.3 (A) 01/26/2021      No OVs, Consults, or hospital visits since last care coordination call / Pharmacist visit. No medication changes indicated   Last adherence delivery  date:08/29/21      Patient is due for next adherence delivery on: 09/28/21  Spoke with patient on 09/18/21 reviewed medications and coordinated delivery.  This delivery to include: Adherence Packaging  30 Days  Packs: Farxiga 5 mg - 1 tablet daily (1 breakfast) Pantoprazole 40 mg - 1 tablet twice daily (1 breakfast, 1 evening meal) Gabapentin 300 mg- 1 tablet twice daily (1 breakfast, 1 evening meal) Duloxetine 30 mg - 1 capsule daily (1 breakfast) Atorvastatin 40 mg - 1 tablet daily (1 evening meal) Isosorbide Mononitrate 30 mg - 1/2 tablet daily (1/2 breakfast) Glipizide 5 mg - 1 tablet twice daily (1 breakfast, 1 evening meal) Metformin 500 mg ER - 2 tablets ( breakfast) Eliquis 5 mg- 1 tablet twice daily (1 breakfast, 1 evening meal) Metoprolol succinate 25 mg ER - 1 tablet daily  (breakfast) Clopidogrel 65m - Take 1 tablet daily ( breakfast)  VIAL medications: Latanoprost 0.005 % eye drops - 1 drop in each eye daily Freestyle libre sensors Ondansetron 433m take 1 tablet every 8 hours    Patient declined the following medications this month: none   Any concerns about your medications? No  How often do you forget or accidentally miss a dose? Never  Do you use a pillbox? No  Is patient in packaging Yes  What is the date on your next pill pack? Not home to read package  Any concerns or issues with your packaging? Patient satisfied .   Refills requested from providers include: duloxetine,glipizide,metformin,isosorbide,ondansetron,eliquis  Confirmed delivery date of 09/28/21, advised patient that pharmacy will contact them the morning of delivery.  Recent blood pressure readings are as follows:none available    Recent blood glucose readings are as follows:none available   Annual wellness visit in last year? Yes Most Recent BP reading:131/81   If Diabetic: Most recent A1C reading:8.3 Last eye exam / retinopathy screening:UTD Last diabetic foot exam:UTD   [facility].  CCM 10/04/21  [facility]okeCPP notified  [place]vel Sensor[place]old [phone]

## 2021-09-19 NOTE — Telephone Encounter (Signed)
Refill request  Last refills 06/21/21 No  show 09/07/21 CPE labs No show  PCP 09/14/21 No upcoming appointment with PCP

## 2021-09-21 NOTE — Telephone Encounter (Signed)
Plz contact pt as she's missed her physical.  I've sent 1 30mosupply. She will need OV prior to more refills.

## 2021-09-21 NOTE — Telephone Encounter (Signed)
Lvm asking pt to call back.  Need to relay Dr. Synthia Innocent message and r/s CPE and lab visits.

## 2021-09-24 ENCOUNTER — Ambulatory Visit: Payer: Medicare Other | Admitting: Cardiology

## 2021-09-24 NOTE — Telephone Encounter (Addendum)
Lvm asking pt to call back.  Need to relay Dr. Synthia Innocent message and r/s CPE (any available 30 min slot) and lab visits.

## 2021-09-25 NOTE — Telephone Encounter (Signed)
LVM for patient to give [place] a call to reschedule CPE.

## 2021-10-03 ENCOUNTER — Telehealth: Payer: Self-pay

## 2021-10-03 NOTE — Chronic Care Management (AMB) (Signed)
Chronic Care Management Pharmacy Assistant   Name: Maria Fox  MRN: [id number] DOB: Jul 20, 1945   Reason for Encounter: Reminder Call   Conditions to be addressed/monitored: HTN and [place] [facility] visits:  4/26/23Aaron Edelman Hagler,MD([facility] )- fracture left little finger.-no admission  Medications: Outpatient Encounter Medications as of 10/03/2021  Medication Sig Note   acetaminophen (TYLENOL) 500 MG tablet Take 500 mg by mouth as needed for moderate pain.    albuterol (PROAIR HFA) 108 (90 Base) MCG/ACT inhaler Inhale 2 puffs into the lungs every 6 (six) hours as needed for wheezing or shortness of breath.    Aloe Vera 72 % CREA Apply to burned areas four times daily.    atorvastatin (LIPITOR) 40 MG tablet TAKE ONE TABLET BY MOUTH EVERY EVENING    Blood Glucose Monitoring Suppl (ONE TOUCH ULTRA 2) w/Device KIT Use as instructed to check blood sugar 2 times daily as needed.    clopidogrel (PLAVIX) 75 MG tablet Take 1 tablet (75 mg total) by mouth daily.    Continuous Blood Gluc Receiver (FREESTYLE LIBRE 2 READER) DEVI Use with sensors to monitor sugar continuously    Continuous Blood Gluc Sensor (FREESTYLE LIBRE 2 SENSOR) MISC Apply sensor every 14 days to monitor sugar continously    Cyanocobalamin (VITAMIN B12) 500 MCG TABS Take 1 tablet by mouth daily.    docusate sodium (COLACE) 100 MG capsule Take 1 capsule (100 mg total) by mouth daily. (Patient taking differently: Take 100 mg by mouth daily as needed for mild constipation.) 07/01/2020: Takes as needed   doxycycline (VIBRAMYCIN) 100 MG capsule Take 1 capsule (100 mg total) by mouth 2 (two) times daily.    DULoxetine (CYMBALTA) 30 MG capsule TAKE ONE CAPSULE BY MOUTH EVERY MORNING    ELIQUIS 5 MG TABS tablet TAKE ONE TABLET BY MOUTH TWICE DAILY    FARXIGA 5 MG TABS tablet TAKE ONE TABLET BY MOUTH EVERY MORNING    feeding supplement, GLUCERNA SHAKE, (GLUCERNA SHAKE) LIQD Take 237 mLs by mouth 3 (three) times daily between  meals.    furosemide (LASIX) 20 MG tablet Take 1 tablet (20 mg total) by mouth daily as needed for fluid or edema (leg swelling).    gabapentin (NEURONTIN) 300 MG capsule TAKE ONE CAPSULE BY MOUTH TWICE DAILY    glipiZIDE (GLUCOTROL) 5 MG tablet TAKE ONE TABLET BY MOUTH TWICE DAILY    glucose blood (ONETOUCH ULTRA) test strip Use as instructed to check blood sugar 2 times daily as needed    isosorbide mononitrate (IMDUR) 30 MG 24 hr tablet TAKE 1/2 TABLET BY MOUTH ONCE DAILY Please keep upcoming appt with Dr Radford Pax 09/24/21 for further refills. Thank you.    latanoprost (XALATAN) 0.005 % ophthalmic solution Place 1 drop into both eyes at bedtime.     metFORMIN (GLUCOPHAGE-XR) 500 MG 24 hr tablet TAKE TWO TABLETS BY MOUTH EVERY MORNING    metoCLOPramide (REGLAN) 10 MG tablet Take 1 tablet (10 mg total) by mouth 4 (four) times daily -  before meals and at bedtime.    metoprolol succinate (TOPROL-XL) 25 MG 24 hr tablet TAKE ONE TABLET BY MOUTH ONCE DAILY    nitroGLYCERIN (NITROSTAT) 0.4 MG SL tablet Place 1 tablet (0.4 mg total) under the tongue every 5 (five) minutes x 3 doses as needed for chest pain.    ondansetron (ZOFRAN-ODT) 4 MG disintegrating tablet TAKE ONE TABLET BY MOUTH every EIGHT hours AS NEEDED FOR NAUSEA AND VOMITING  OneTouch Delica Lancets 40X MISC Use as directed to check blood sugar 2 times daily as needed    pantoprazole (PROTONIX) 40 MG tablet TAKE ONE TABLET BY MOUTH TWICE DAILY    polyethylene glycol (MIRALAX / GLYCOLAX) 17 g packet Take 17 g by mouth daily as needed for moderate constipation.    No facility-administered encounter medications on file as of 10/03/2021.   [name] was contacted to remind of upcoming telephone visit with Charlene Brooke  on 10/04/21 at 3:45pm. Patient was reminded to have any blood glucose and blood pressure readings available for review at appointment.   Message was left reminding patient of appointment.  CCM referral has been placed  prior to visit?  Yes    Star Rating Drugs: Medication:  Last Fill: [facility] Atorvastatin 43m 09/21/21 30 Farxiga 583m 09/21/21 30 Glipizide 53m51m7/14/23 30 Metformin 500m33m14/23 30  [place] notified  [place] [phone]

## 2021-10-04 ENCOUNTER — Telehealth: Payer: Medicare Other

## 2021-10-04 ENCOUNTER — Telehealth: Payer: Self-pay | Admitting: Pharmacist

## 2021-10-04 NOTE — Telephone Encounter (Signed)
  Chronic Care Management   Outreach Note  10/04/2021 Name: Maria Fox MRN: [id number] DOB: [birth year]  Referred by: [name], MD  Patient had a phone appointment scheduled with clinical pharmacist today.  An unsuccessful telephone outreach was attempted today. The patient was referred to the pharmacist for assistance with medications, care management and care coordination.   Patient will NOT be penalized in any way for missing a CCM appointment. The no-show fee does not apply.  If possible, a message was left to return call to: [phone] or to [facility].  Charlene Brooke, PharmD, BCACP Clinical Pharmacist [facility] [phone]

## 2021-10-04 NOTE — Progress Notes (Deleted)
Chronic Care Management Pharmacy Note  10/04/2021 Name:  Maria Fox MRN:  [id number] DOB:  05/14/1945  Summary: CCM F/U visit -A1c improved to 8.3 (01/2021); pt has not been taking Lantus, and today states she will not ever take insulin. She is not checking BG at home due to misplaced meter.   Recommendations/Changes made from today's visit: -Try Freestyle Libre 2 [facility] may cover this without insulin now)   Plan: -PCP 11/06/21 -[facility] will call patient 2 month for DM update -Pharmacist follow up televisit scheduled for 3 months   Subjective: Maria Fox is an 76 y.o. year old female who is a primary patient of [name], MD.  The CCM team was consulted for assistance with disease management and care coordination needs.    Engaged with patient by telephone for follow up visit in response to provider referral for pharmacy case management and/or care coordination services.   Consent to Services:  The patient was given information about Chronic Care Management services, agreed to services, and gave verbal consent prior to initiation of services.  Please see initial visit note for detailed documentation.   Patient Care Team: [name], MD as PCP - General (Family Medicine) Sueanne Margarita, MD as PCP - Cardiology (Cardiology) Charlton Haws, [facility] as Pharmacist (Pharmacist)  Recent office visits: 01/26/21 Dr [name] OV: f/u DM. Pt has been noncompliant with Lantus, she is taking pills. Restart Lantus. Restart B12 500 mcg daily. GFR worsened  Recent consult visits: 02/06/21 Urgent Care - vaginal abscess. Rx'd doxycycline  Hospital visits: 07/04/21 Urgent Care - finger fracture   Objective:  Lab Results  Component Value Date   CREATININE 1.45 (H) 01/26/2021   BUN 14 01/26/2021   GFR 37.02 (L) 05/02/2020   GFRNONAA >60 08/01/2020   GFRAA 41 (L) 01/28/2020   NA 141 01/26/2021   K 4.0 01/26/2021   CALCIUM 9.6 01/26/2021   CO2 21  01/26/2021   GLUCOSE 236 (H) 01/26/2021    Lab Results  Component Value Date/Time   HGBA1C 8.3 (A) 01/26/2021 03:50 PM   HGBA1C 11.6 (H) 07/03/2020 09:37 AM   HGBA1C 12.2 (H) 05/02/2020 03:25 PM   FRUCTOSAMINE 302 (H) 11/10/2015 04:27 PM   GFR 37.02 (L) 05/02/2020 03:25 PM   GFR 41.63 (L) 08/03/2019 01:03 PM   MICROALBUR <0.7 05/02/2020 03:25 PM   MICROALBUR <0.7 11/08/2019 05:08 PM    Last diabetic Eye exam:  Lab Results  Component Value Date/Time   HMDIABEYEEXA Retinopathy (A) 03/23/2020 12:00 AM    Last diabetic Foot exam: No results found for: "HMDIABFOOTEX"   Lab Results  Component Value Date   CHOL 109 05/02/2020   HDL 41.10 05/02/2020   LDLCALC 40 05/02/2020   LDLDIRECT 60 01/26/2021   TRIG 139.0 05/02/2020   CHOLHDL 3 05/02/2020       Latest Ref Rng & Units 08/01/2020    1:01 AM 07/29/2020   11:37 AM 07/11/2020    6:12 AM  Hepatic Function  Total Protein 6.5 - 8.1 g/dL 5.7  7.8  5.7   Albumin 3.5 - 5.0 g/dL 2.7  3.7  2.6   AST 15 - 41 U/L _0 ALT 0 - 44 U/L _1 Alk Phosphatase 38 - 126 U/L 73  109  61   Total Bilirubin 0.3 - 1.2 mg/dL 0.4  1.2  0.1     Lab Results  Component Value Date/Time  TSH 1.187 07/15/2020 06:11 AM   TSH 1.247 07/10/2019 10:27 AM   TSH 2.64 05/02/2017 04:13 PM   TSH 3.457 07/08/2014 05:00 AM   FREET4 0.87 07/13/2014 11:18 AM       Latest Ref Rng & Units 08/01/2020    1:01 AM 07/31/2020    1:56 AM 07/30/2020    6:36 AM  CBC  WBC 4.0 - 10.5 K/uL 7.3  8.4  8.5   Hemoglobin 12.0 - 15.0 g/dL 10.5  10.4  11.6   Hematocrit 36.0 - 46.0 % 31.6  30.9  34.7   Platelets 150 - 400 K/uL 265  235  274     Lab Results  Component Value Date/Time   VD25OH 32.36 05/02/2020 03:25 PM    Clinical ASCVD: Yes  The ASCVD Risk score (Arnett DK, et al., 2019) failed to calculate for the following reasons:   The patient has a prior MI or stroke diagnosis       06/06/2020    3:38 PM 05/02/2020    4:55 PM 03/15/2019    3:07 PM   Depression screen PHQ 2/9  Decreased Interest 2 2 0  Down, Depressed, Hopeless 2 2 0  PHQ - 2 Score 4 4 0  Altered sleeping 0 0   Tired, decreased energy 0 2   Change in appetite 0 2   Feeling bad or failure about yourself  0 2   Trouble concentrating 0 1   Moving slowly or fidgety/restless 0 1   Suicidal thoughts 0 0   PHQ-9 Score 4 12   Difficult doing work/chores Not difficult at all Very difficult      Social History   Tobacco Use  Smoking Status Never  Smokeless Tobacco Never   BP Readings from Last 3 Encounters:  07/04/21 (!) 157/83  02/06/21 131/81  01/26/21 138/74   Pulse Readings from Last 3 Encounters:  07/04/21 73  02/06/21 94  01/26/21 68   Wt Readings from Last 3 Encounters:  01/26/21 137 lb 7 oz (62.3 kg)  09/08/20 140 lb 3.4 oz (63.6 kg)  07/29/20 140 lb 3.4 oz (63.6 kg)   BMI Readings from Last 3 Encounters:  01/26/21 24.35 kg/m  09/08/20 24.84 kg/m  07/29/20 24.84 kg/m    Assessment/Interventions: Review of patient past medical history, allergies, medications, health status, including review of consultants reports, laboratory and other test data, was performed as part of comprehensive evaluation and provision of chronic care management services.   SDOH:  (Social Determinants of Health) assessments and interventions performed: Yes   SDOH Screenings   Alcohol Screen: Low Risk  (06/06/2020)   Alcohol Screen    Last Alcohol Screening Score (AUDIT): 0  Depression (PHQ2-9): Low Risk  (06/06/2020)   Depression (PHQ2-9)    PHQ-2 Score: 4  Recent Concern: Depression (PHQ2-9) - Medium Risk (05/02/2020)   Depression (PHQ2-9)    PHQ-2 Score: 12  Financial Resource Strain: Low Risk  (06/18/2021)   Overall Financial Resource Strain (CARDIA)    Difficulty of Paying Living Expenses: Not very hard  Food Insecurity: No Food Insecurity (06/18/2021)   Hunger Vital Sign    Worried About Running Out of Food in the Last Year: Never true    Ran Out of Food in  the Last Year: Never true  Housing: Low Risk  (06/06/2020)   Housing    Last Housing Risk Score: 0  Physical Activity: Inactive (06/06/2020)   Exercise Vital Sign    Days of Exercise per Week:  0 days    Minutes of Exercise per Session: 0 min  Social Connections: Not on file  Stress: No Stress Concern Present (06/06/2020)   [place]    Feeling of Stress : Not at all  Tobacco Use: Low Risk  (07/04/2021)   Patient History    Smoking Tobacco Use: Never    Smokeless Tobacco Use: Never    Passive Exposure: Not on file  Transportation Needs: No Transportation Needs (06/06/2020)   PRAPARE - Transportation    Lack of Transportation (Medical): No    Lack of Transportation (Non-Medical): No    CCM Care Plan  Allergies  Allergen Reactions   Bee Venom     Throat swelling   Mushroom Extract Complex Anaphylaxis   Penicillins Anaphylaxis, Hives and Swelling    Tolerates cephalosporins including cephalexin multiple times.  Has patient had a PCN reaction causing immediate rash, facial/tongue/throat swelling, SOB or lightheadedness with hypotension: Yes Has patient had a PCN reaction causing severe rash involving mucus membranes or skin necrosis: Yes Has patient had a PCN reaction that required hospitalization Yes Has patient had a PCN reaction occurring within the last 10 years: No     Codeine Nausea And Vomiting   Sulfa Antibiotics Nausea And Vomiting   Iohexol Itching and Swelling   Erythromycin Base Rash    Medications Reviewed Today     Reviewed by Charlton Haws, [facility] (Pharmacist) on 06/18/21 at 1251  Med List Status: <None>   Medication Order Taking? Sig Documenting Provider Last Dose Status Informant  acetaminophen (TYLENOL) 500 MG tablet [id number] Yes Take 500 mg by mouth as needed for moderate pain. [provider] Taking Active   albuterol (PROAIR HFA) 108 (90 Base) MCG/ACT inhaler [id number] Yes  Inhale 2 puffs into the lungs every 6 (six) hours as needed for wheezing or shortness of breath. [name], MD Taking Active   Aloe Vera 72 % CREA [id number] Yes Apply to burned areas four times daily. Maudie Flakes, MD Taking Active   atorvastatin (LIPITOR) 40 MG tablet [id number] Yes TAKE ONE TABLET BY MOUTH EVERY Recardo Evangelist, MD Taking Active   Blood Glucose Monitoring Suppl (ONE TOUCH ULTRA 2) w/Device KIT [id number] Yes Use as instructed to check blood sugar 2 times daily as needed. [name], MD Taking Active   clopidogrel (PLAVIX) 75 MG tablet [id number] Yes Take 1 tablet (75 mg total) by mouth daily. Sueanne Margarita, MD Taking Active   Cyanocobalamin (VITAMIN B12) 500 MCG TABS [id number] Yes Take 1 tablet by mouth daily. [name], MD Taking Active   dapagliflozin propanediol (FARXIGA) 5 MG TABS tablet [id number] Yes Take 5 mg by mouth daily. [provider] Taking Active Self  docusate sodium (COLACE) 100 MG capsule [id number] Yes Take 1 capsule (100 mg total) by mouth daily.  Patient taking differently: Take 100 mg by mouth daily as needed for mild constipation.   [name], MD Taking Active            Med Note Sarajane Jews, CALLIE   Sat Jul 01, 2020  1:47 PM) Dewaine Conger as needed  doxycycline (VIBRAMYCIN) 100 MG capsule [id number] Yes Take 1 capsule (100 mg total) by mouth 2 (two) times daily. Raspet, Derry Skill, PA-C Taking Active   DULoxetine (CYMBALTA) 30 MG capsule [id number] Yes Take 30 mg by mouth every morning. [provider] Taking Active   ELIQUIS 5 MG TABS tablet [id number] Yes TAKE ONE TABLET BY MOUTH  TWICE DAILY [name], MD Taking Active   feeding supplement, GLUCERNA SHAKE, ([place]) LIQD [id number] Yes Take 237 mLs by mouth 3 (three) times daily between meals. Mariel Aloe, MD Taking Active   furosemide (LASIX) 20 MG tablet [id number] Yes Take 1 tablet (20 mg total) by mouth daily as needed for fluid or edema (leg  swelling). Arrien, Jimmy Picket, MD Taking Active Self  gabapentin (NEURONTIN) 300 MG capsule [id number] Yes Take 1 capsule (300 mg total) by mouth 2 (two) times daily. [name], MD Taking Active   glipiZIDE (GLUCOTROL) 5 MG tablet [id number] Yes TAKE ONE TABLET BY MOUTH TWICE DAILY [name], MD Taking Active   glucose blood [facility] ULTRA) test strip [id number] Yes Use as instructed to check blood sugar 2 times daily as needed [name], MD Taking Active   isosorbide mononitrate (IMDUR) 30 MG 24 hr tablet [id number] Yes Take 0.5 tablets (15 mg total) by mouth daily. Pt needs to make yearly appt with provider to receive further refills - 2nd attempt Turner, Eber Hong, MD Taking Active   latanoprost (XALATAN) 0.005 % ophthalmic solution [id number] Yes Place 1 drop into both eyes at bedtime.  [provider] Taking Active Self  metFORMIN (GLUCOPHAGE-XR) 500 MG 24 hr tablet [id number] Yes TAKE TWO TABLETS BY MOUTH EVERY MORNING [name], MD Taking Active   metoCLOPramide (REGLAN) 10 MG tablet [id number]  Take 1 tablet (10 mg total) by mouth 4 (four) times daily -  before meals and at bedtime. Mariel Aloe, MD  Expired 10/17/20 2359   metoprolol succinate (TOPROL XL) 25 MG 24 hr tablet [id number]  Take 1 tablet (25 mg total) by mouth daily. [name], MD  Active   nitroGLYCERIN (NITROSTAT) 0.4 MG SL tablet [id number] Yes Place 1 tablet (0.4 mg total) under the tongue every 5 (five) minutes x 3 doses as needed for chest pain. Margie Billet, NP Taking Active   ondansetron (ZOFRAN-ODT) 4 MG disintegrating tablet [id number] Yes Take 1 tablet (4 mg total) by mouth every 8 (eight) hours as needed for nausea or vomiting. [name], MD Taking Active   [facility] Lancets 93Y MISC [id number] Yes Use as directed to check blood sugar 2 times daily as needed [name], MD Taking Active   pantoprazole (PROTONIX) 40 MG tablet [id number] Yes Take 1 tablet (40 mg  total) by mouth 2 (two) times daily. [name], MD Taking Active   polyethylene glycol (MIRALAX / GLYCOLAX) 17 g packet [id number] Yes Take 17 g by mouth daily as needed for moderate constipation. Arrien, Jimmy Picket, MD Taking Active Self            Patient Active Problem List   Diagnosis Date Noted   Abdominal pain 07/31/2020   Functional abdominal pain syndrome 07/29/2020   Intractable nausea and vomiting 07/29/2020   Intractable vomiting    Diarrhea    Lower abdominal pain    Acute cystitis 07/10/2020   Atypical chest pain 07/04/2020   CAD (coronary artery disease) 07/04/2020   Low-tension glaucoma, bilateral, severe stage 03/24/2020   Dysuria 11/08/2019   Right sided temporal headache 08/06/2019   Atrial flutter with rapid ventricular response ([name])    Acute deep vein thrombosis (DVT) of left tibial vein ([place]) 07/11/2019   Acute cholecystitis 07/07/2019   Pain of right breast 03/15/2019   Health maintenance examination 12/11/2018   Type 2 diabetes mellitus with severe nonproliferative diabetic retinopathy with macular edema, bilateral ([facility]) 07/16/2018   Coronary artery disease  of native artery of native heart with stable angina pectoris ([place])    Effort angina ([place]) 11/12/2017   Abnormal stress ECG    Dyspnea on exertion 11/04/2017   GERD (gastroesophageal reflux disease) 08/15/2017   Trigger finger 06/09/2017   Nocturnal leg movements 03/25/2017   Osteopenia 02/01/2017   Advanced care planning/counseling discussion 01/02/2017   Insomnia 08/26/2016   Vitamin B12 deficiency 11/10/2015   General weakness 08/04/2015   Epistaxis 07/07/2015   Fatty liver disease, nonalcoholic 93/81/8299   Chronic diastolic CHF (congestive heart failure) ([place]) 08/27/2014   Positive hepatitis C antibody test 08/27/2014   Iron deficiency 08/27/2014   NSVT (nonsustained ventricular tachycardia) ([name])    Nausea without vomiting    Autonomic orthostatic hypotension 07/11/2014    Lower urinary tract infectious disease 07/08/2014   S/P CABG x 3 07/08/2014   History of cerebrovascular accident (CVA) in adulthood 07/08/2014   Hypokalemia 03/23/2014   Type 2 diabetes mellitus with neurological complications ([place]) 37/16/9678   Itching 03/16/2012   Rash 03/16/2012   Medicare annual wellness visit, initial 10/28/2011   Hyperlipidemia associated with type 2 diabetes mellitus ([place])    Idiopathic angioedema 06/23/2011   Vitiligo 06/23/2011   Dermatitis 06/23/2011   MDD (major depressive disorder), recurrent severe, without psychosis ([place]) 05/08/2006   Hypertension, essential 05/08/2006   MENOPAUSAL SYNDROME 05/08/2006    Immunization History  Administered Date(s) Administered   Fluad Quad(high Dose 65+) 12/11/2018, 01/26/2021   Influenza Split 12/17/2011   Influenza Whole 01/22/2013   Influenza,inj,Quad PF,6+ Mos 11/10/2015, 12/26/2016, 11/26/2017   Moderna Sars-Covid-2 Vaccination 03/21/2020   Pneumococcal Conjugate-13 12/26/2016   Pneumococcal-Unspecified 11/30/2010   Td 08/10/2003    Conditions to be addressed/monitored:  Diabetes and Depression  There are no care plans that you recently modified to display for this patient.     Medication Assistance: None required.  Patient affirms current coverage meets needs. - LIS  Compliance/Adherence/Medication fill history: Care Gaps: Mammogram (due 03/2020) Eye exam (due 03/2021) Urine microalbumin (due 04/2021)  Star-Rating Drugs: Atorvastatin - PDC 100% Glipizide - PDC 100% Metformin - PDC 100% Farxiga - PDC 100%  Medication Access: Within the past 30 days, how often has patient missed a dose of medication? *** Is a pillbox or other method used to improve adherence? {YES/NO:21197} Factors that may affect medication adherence? {CHL DESC; BARRIERS:21522} Are meds synced by current pharmacy? {YES/NO:21197} Are meds delivered by current pharmacy? {YES/NO:21197} Does patient experience delays in picking up  medications due to transportation concerns? {YES/NO:21197}  [facility] Reviewed: Is patient disadvantaged to use [facility]?: {YES/NO:21197} Current Rx insurance plan: *** Name and location of Current pharmacy:  [facility] - [place], [place] - [address] [place] [address] Phone: [phone] Fax: [phone]  [id number] [place] [id number] [id number]: [phone]: [phone] [facility] services reviewed with patient today?: Yes  30-day Packs (delivered 09/28/21) Farxiga 5 mg - 1 tablet daily (1 breakfast) Pantoprazole 40 mg - 1 tablet twice daily (1 breakfast, 1 evening meal) Gabapentin 300 mg- 1 tablet twice daily (1 breakfast, 1 evening meal) Duloxetine 30 mg - 1 capsule daily (1 breakfast) Atorvastatin 40 mg - 1 tablet daily (1 evening meal) Isosorbide Mononitrate 30 mg - 1/2 tablet daily (1/2 breakfast) Glipizide 5 mg - 1 tablet twice daily (1 breakfast, 1 evening meal) Metformin 500 mg  ER - 2 tablets ( breakfast) Eliquis 5 mg- 1 tablet twice daily (1 breakfast, 1 evening meal) Metoprolol succinate 25 mg ER - 1 tablet daily  (breakfast) Clopidogrel 36m - Take 1 tablet daily ( breakfast)   VIAL medications: Latanoprost 0.005 % eye drops - 1 drop in each eye daily Freestyle libre sensors Ondansetron 455m take 1 tablet every 8 hours   Care Plan and Follow Up Patient Decision:  Patient agrees to Care Plan and Follow-up.  Plan: Telephone follow up appointment with care management team member scheduled for:  3 months  LiCharlene BrookePharmD, BCACP Clinical Pharmacist [phone]  Current Barriers:  Unable to achieve control of diabetes  Unable to achieve control of depression  Pharmacist Clinical Goal(s):  Patient will achieve  adherence to monitoring guidelines and medication adherence to achieve therapeutic efficacy through collaboration with PharmD and provider.   Interventions: 1:1 collaboration with GuRia BushMD regarding development and update of comprehensive plan of care as evidenced by provider attestation and co-signature Inter-disciplinary care team collaboration (see longitudinal plan of care) Comprehensive medication review performed; medication list updated in electronic medical record  Diabetes (A1c goal <7%) -Uncontrolled, improving - (A1c 8.3 01/2021); pt is non compliant with insulin, BG checks; she states "I'm never going to take insulin"; She does affirm taking her oral medications as prescribed. -Current home glucose readings - n/a (misplaced meter, no motivation to check BG) -Current medications: Farxiga 5 mg daily - Appropriate, Effective, Safe, Accessible Metformin 500 mg ER - 2 tab AM- Appropriate, Effective, Safe, Accessible Glipizide 5 mg BID- Appropriate, Effective, Safe, Accessible Lantus 10 units daily - not taking -Medications previously tried: Ozempic (never started) -Current meal patterns: poor appetite, only eating once/day -Discussed benefits of FrColgate-Palmolivept would like to try it; consider trial of Ozempic again in future (cost should not be an issue with LIS) -Recommended to start FrColgate-PalmoliveOrdered to pharmacy.  Depression/Anxiety (Goal: Improve symptoms) -Improving  - She has had good and bad days -History of auditory hallucinations  -PHQ9:  4 (06/06/20) - minimal depression -Current treatment: Duloxetine 30 mg daily - Appropriate, Effective, Safe, Accessible -Medications previously tried/failed: sertraline 2015 (75 mg) --> changed to duloxetine 30, up to 60 mg (2017-2018) --> changed to citalopram 2018 - unknown efficacy. There is note in chart that duloxetine was not effective and thus changed to citalopram. But then later placed back on duloxetine after  a psych admission along with trazodone and Geodon. -Declines interest in CBT in the past. Reports this was not effective. -Recommend to continue current medication  Patient Goals/Self-Care Activities Patient will:  - take medications as prescribed - follow up with health care providers

## 2021-10-17 ENCOUNTER — Other Ambulatory Visit: Payer: Self-pay | Admitting: Family Medicine

## 2021-10-17 ENCOUNTER — Other Ambulatory Visit: Payer: Self-pay | Admitting: Cardiology

## 2021-10-17 DIAGNOSIS — E785 Hyperlipidemia, unspecified: Secondary | ICD-10-CM

## 2021-10-17 NOTE — Telephone Encounter (Signed)
Pt was a 'No show' for last 3 appts.    Plz r/s CPE and lab visits.  Then refills will be sent in.  (Send refill note back to [facility] to send in refills.)  Pt needs to keep appts for additional refills.

## 2021-10-17 NOTE — Telephone Encounter (Signed)
Noted.  E-scribed refills.

## 2021-10-17 NOTE — Telephone Encounter (Signed)
Patient has been scheduled

## 2021-10-18 ENCOUNTER — Telehealth: Payer: Self-pay

## 2021-10-18 NOTE — Chronic Care Management (AMB) (Signed)
Chronic Care Management Pharmacy Assistant   Name: Maria Fox  MRN: [id number] DOB: [birth year]  Reason for Encounter: Medication Adherence and Delivery Coordination    Recent office visits:  None since last CCM contact   Recent consult visits:  None since last CCM contact   Hospital visits:  None in previous 6 months  Medications: Outpatient Encounter Medications as of 10/18/2021  Medication Sig Note   acetaminophen (TYLENOL) 500 MG tablet Take 500 mg by mouth as needed for moderate pain.    albuterol (PROAIR HFA) 108 (90 Base) MCG/ACT inhaler Inhale 2 puffs into the lungs every 6 (six) hours as needed for wheezing or shortness of breath.    Aloe Vera 72 % CREA Apply to burned areas four times daily.    atorvastatin (LIPITOR) 40 MG tablet TAKE ONE TABLET BY MOUTH EVERY EVENING    Blood Glucose Monitoring Suppl (ONE TOUCH ULTRA 2) w/Device KIT Use as instructed to check blood sugar 2 times daily as needed.    clopidogrel (PLAVIX) 75 MG tablet Take 1 tablet (75 mg total) by mouth daily.    Continuous Blood Gluc Receiver (FREESTYLE LIBRE 2 READER) DEVI Use with sensors to monitor sugar continuously    Continuous Blood Gluc Sensor (FREESTYLE LIBRE 2 SENSOR) MISC Apply sensor every 14 days to monitor sugar continously    Cyanocobalamin (VITAMIN B12) 500 MCG TABS Take 1 tablet by mouth daily.    dapagliflozin propanediol (FARXIGA) 5 MG TABS tablet TAKE ONE TABLET BY MOUTH EVERY MORNING    docusate sodium (COLACE) 100 MG capsule Take 1 capsule (100 mg total) by mouth daily. (Patient taking differently: Take 100 mg by mouth daily as needed for mild constipation.) 07/01/2020: Takes as needed   doxycycline (VIBRAMYCIN) 100 MG capsule Take 1 capsule (100 mg total) by mouth 2 (two) times daily.    DULoxetine (CYMBALTA) 30 MG capsule TAKE ONE CAPSULE BY MOUTH EVERY MORNING    ELIQUIS 5 MG TABS tablet TAKE ONE TABLET BY MOUTH TWICE DAILY    feeding supplement, GLUCERNA SHAKE, (GLUCERNA  SHAKE) LIQD Take 237 mLs by mouth 3 (three) times daily between meals.    furosemide (LASIX) 20 MG tablet Take 1 tablet (20 mg total) by mouth daily as needed for fluid or edema (leg swelling).    gabapentin (NEURONTIN) 300 MG capsule TAKE ONE CAPSULE BY MOUTH TWICE DAILY    glipiZIDE (GLUCOTROL) 5 MG tablet TAKE ONE TABLET BY MOUTH TWICE DAILY    glucose blood (ONETOUCH ULTRA) test strip Use as instructed to check blood sugar 2 times daily as needed    isosorbide mononitrate (IMDUR) 30 MG 24 hr tablet TAKE 1/2 TABLET BY MOUTH ONCE DAILY Please keep upcoming appt with Dr Radford Pax 09/24/21 for further refills. Thank you.    latanoprost (XALATAN) 0.005 % ophthalmic solution Place 1 drop into both eyes at bedtime.     metFORMIN (GLUCOPHAGE-XR) 500 MG 24 hr tablet TAKE TWO TABLETS BY MOUTH EVERY MORNING    metoCLOPramide (REGLAN) 10 MG tablet Take 1 tablet (10 mg total) by mouth 4 (four) times daily -  before meals and at bedtime.    metoprolol succinate (TOPROL-XL) 25 MG 24 hr tablet TAKE ONE TABLET BY MOUTH ONCE DAILY    nitroGLYCERIN (NITROSTAT) 0.4 MG SL tablet Place 1 tablet (0.4 mg total) under the tongue every 5 (five) minutes x 3 doses as needed for chest pain.    ondansetron (ZOFRAN-ODT) 4 MG disintegrating tablet TAKE ONE TABLET BY MOUTH  every EIGHT hours AS NEEDED FOR NAUSEA AND VOMITING    OneTouch Delica Lancets 85T MISC Use as directed to check blood sugar 2 times daily as needed    pantoprazole (PROTONIX) 40 MG tablet TAKE ONE TABLET BY MOUTH TWICE DAILY    polyethylene glycol (MIRALAX / GLYCOLAX) 17 g packet Take 17 g by mouth daily as needed for moderate constipation.    No facility-administered encounter medications on file as of 10/18/2021.   BP Readings from Last 3 Encounters:  07/04/21 (!) 157/83  02/06/21 131/81  01/26/21 138/74    Lab Results  Component Value Date   HGBA1C 8.3 (A) 01/26/2021      No OVs, Consults, or hospital visits since last care coordination call /  Pharmacist visit. No medication changes indicated   Last adherence delivery date:09/28/21      Patient is due for next adherence delivery on: 10/30/21  Spoke with patient on 10/19/21 reviewed medications and coordinated delivery.  This delivery to include: Adherence Packaging  30 Days  Packs: Farxiga 5 mg - 1 tablet daily (1 breakfast) Pantoprazole 40 mg - 1 tablet twice daily (1 breakfast, 1 evening meal) Gabapentin 300 mg- 1 tablet twice daily (1 breakfast, 1 evening meal) Duloxetine 30 mg - 1 capsule daily (1 breakfast) Atorvastatin 40 mg - 1 tablet daily (1 evening meal) Isosorbide Mononitrate 30 mg - 1/2 tablet daily (1/2 breakfast) Glipizide 5 mg - 1 tablet twice daily (1 breakfast, 1 evening meal) Metformin 500 mg ER - 2 tablets ( breakfast) Eliquis 5 mg- 1 tablet twice daily (1 breakfast, 1 evening meal) Metoprolol succinate 25 mg ER - 1 tablet daily  (breakfast) Clopidogrel 61m - Take 1 tablet daily ( breakfast)  VIAL medications: Latanoprost 0.005 % eye drops - 1 drop in each eye daily Ondansetron 433m take 1 tablet every 8 hours    Patient declined the following medications this month: Freestyle libre sensors- patient is not using system   Any concerns about your medications? No  How often do you forget or accidentally miss a dose? Rarely  Do you use a pillbox? No  Is patient in packaging Yes  What is the date on your next pill pack? Patient still in bed  Any concerns or issues with your packaging? No concerns    Refills requested from providers include:clopidogrel,atorvastatin,latanoprost,farxiga,isosorbide,ondansetron  Explained to the patient she needed to contact cardiology for appointment.  Confirmed delivery date of 10/30/21, advised patient that pharmacy will contact them the morning of delivery.  Recent blood pressure readings are as follows:none available    Recent blood glucose readings are as follows:none available    Annual wellness visit in  last year? Yes Most Recent BP reading:131/81   If Diabetic: Most recent A1C reading:8.3 Last eye exam / retinopathy screening:UTD Last diabetic foot exam:UTD  [facility].   LiCharlene BrookeCPP notified  Ve[place] [phone]

## 2021-10-24 ENCOUNTER — Telehealth: Payer: Self-pay | Admitting: Cardiology

## 2021-10-24 DIAGNOSIS — E785 Hyperlipidemia, unspecified: Secondary | ICD-10-CM

## 2021-10-24 MED ORDER — CLOPIDOGREL BISULFATE 75 MG PO TABS: 75.0000 mg | ORAL_TABLET | Freq: Every day | ORAL | 1 refills | Status: DC

## 2021-10-24 MED ORDER — NITROGLYCERIN 0.4 MG SL SUBL: 0.4000 mg | SUBLINGUAL_TABLET | SUBLINGUAL | 1 refills | Status: DC | PRN

## 2021-10-24 NOTE — Telephone Encounter (Signed)
*  STAT* If patient is at the pharmacy, call can be transferred to refill team.   1. Which medications need to be refilled? (please list name of each medication and dose if known) clopidogrel (PLAVIX) 75 MG tablet; nitroGLYCERIN (NITROSTAT) 0.4 MG SL tablet  2. Which pharmacy/location (including street and city if local pharmacy) is medication to be sent to?[facility] - [place], [place] - [place] [address]  3. Do they need a 30 day or 90 day supply? [place]

## 2021-10-24 NOTE — Telephone Encounter (Signed)
Pt's medications were sent to pt's pharmacy as requested. Confirmation received.  

## 2021-10-25 ENCOUNTER — Ambulatory Visit (INDEPENDENT_AMBULATORY_CARE_PROVIDER_SITE_OTHER): Payer: Medicare Other | Admitting: *Deleted

## 2021-10-25 DIAGNOSIS — Z0000 Encounter for general adult medical examination without abnormal findings: Secondary | ICD-10-CM | POA: Diagnosis not present

## 2021-10-25 DIAGNOSIS — Z1231 Encounter for screening mammogram for malignant neoplasm of breast: Secondary | ICD-10-CM

## 2021-10-25 DIAGNOSIS — Z780 Asymptomatic menopausal state: Secondary | ICD-10-CM | POA: Diagnosis not present

## 2021-10-25 NOTE — Patient Instructions (Signed)
Maria Fox , Thank you for taking time to come for your Medicare Wellness Visit. I appreciate your ongoing commitment to your health goals. Please review the following plan we discussed and let me know if I can assist you in the future.   Screening recommendations/referrals: Colonoscopy: no longer required Mammogram Education provided Bone Density: Education provided Recommended yearly ophthalmology/optometry visit for glaucoma screening and checkup Recommended yearly dental visit for hygiene and checkup  Vaccinations: Influenza vaccine: up to date Pneumococcal vaccine: up to date Tdap vaccine: Education provided Shingles vaccine: Education provided    Advanced directives: Education provided    Next appointment: 10-30-2021 @ 4:00  LAB       11-06-2021 @ 2:00 [facility] 65 Years and Older, Female Preventive care refers to lifestyle choices and visits with your health care provider that can promote health and wellness. What does preventive care include? A yearly physical exam. This is also called an annual well check. Dental exams once or twice a year. Routine eye exams. Ask your health care provider how often you should have your eyes checked. Personal lifestyle choices, including: Daily care of your teeth and gums. Regular physical activity. Eating a healthy diet. Avoiding tobacco and drug use. Limiting alcohol use. Practicing safe sex. Taking low-dose aspirin every day. Taking vitamin and mineral supplements as recommended by your health care provider. What happens during an annual well check? The services and screenings done by your health care provider during your annual well check will depend on your age, overall health, lifestyle risk factors, and family history of disease. Counseling  Your health care provider may ask you questions about your: Alcohol use. Tobacco use. Drug use. Emotional well-being. Home and relationship well-being. Sexual  activity. Eating habits. History of falls. Memory and ability to understand (cognition). Work and work Statistician. Reproductive health. Screening  You may have the following tests or measurements: Height, weight, and BMI. Blood pressure. Lipid and cholesterol levels. These may be checked every 5 years, or more frequently if you are over 1 years old. Skin check. Lung cancer screening. You may have this screening every year starting at age 63 if you have a 30-pack-year history of smoking and currently smoke or have quit within the past 15 years. Fecal occult blood test (FOBT) of the stool. You may have this test every year starting at age 47. Flexible sigmoidoscopy or colonoscopy. You may have a sigmoidoscopy every 5 years or a colonoscopy every 10 years starting at age 74. Hepatitis C blood test. Hepatitis B blood test. Sexually transmitted disease (STD) testing. Diabetes screening. This is done by checking your blood sugar (glucose) after you have not eaten for a while (fasting). You may have this done every 1-3 years. Bone density scan. This is done to screen for osteoporosis. You may have this done starting at age 98. Mammogram. This may be done every 1-2 years. Talk to your health care provider about how often you should have regular mammograms. Talk with your health care provider about your test results, treatment options, and if necessary, the need for more tests. Vaccines  Your health care provider may recommend certain vaccines, such as: Influenza vaccine. This is recommended every year. Tetanus, diphtheria, and acellular pertussis (Tdap, Td) vaccine. You may need a Td booster every 10 years. Zoster vaccine. You may need this after age 40. Pneumococcal 13-valent conjugate (PCV13) vaccine. One dose is recommended after age 19. Pneumococcal polysaccharide (PPSV23) vaccine. One dose is recommended after age 33.  Talk to your health care provider about which screenings and vaccines  you need and how often you need them. This information is not intended to replace advice given to you by your health care provider. Make sure you discuss any questions you have with your health care provider. Document Released: 03/24/2015 Document Revised: 11/15/2015 Document Reviewed: 12/27/2014 Elsevier Interactive Patient Education  2017 [place] Prevention in the Home Falls can cause injuries. They can happen to people of all ages. There are many things you can do to make your home safe and to help prevent falls. What can I do on the outside of my home? Regularly fix the edges of walkways and driveways and fix any cracks. Remove anything that might make you trip as you walk through a door, such as a raised step or threshold. Trim any bushes or trees on the path to your home. Use bright outdoor lighting. Clear any walking paths of anything that might make someone trip, such as rocks or tools. Regularly check to see if handrails are loose or broken. Make sure that both sides of any steps have handrails. Any raised decks and porches should have guardrails on the edges. Have any leaves, snow, or ice cleared regularly. Use sand or salt on walking paths during winter. Clean up any spills in your garage right away. This includes oil or grease spills. What can I do in the bathroom? Use night lights. Install grab bars by the toilet and in the tub and shower. Do not use towel bars as grab bars. Use non-skid mats or decals in the tub or shower. If you need to sit down in the shower, use a plastic, non-slip stool. Keep the floor dry. Clean up any water that spills on the floor as soon as it happens. Remove soap buildup in the tub or shower regularly. Attach bath mats securely with double-sided non-slip rug tape. Do not have throw rugs and other things on the floor that can make you trip. What can I do in the bedroom? Use night lights. Make sure that you have a light by your bed that  is easy to reach. Do not use any sheets or blankets that are too big for your bed. They should not hang down onto the floor. Have a firm chair that has side arms. You can use this for support while you get dressed. Do not have throw rugs and other things on the floor that can make you trip. What can I do in the kitchen? Clean up any spills right away. Avoid walking on wet floors. Keep items that you use a lot in easy-to-reach places. If you need to reach something above you, use a strong step stool that has a grab bar. Keep electrical cords out of the way. Do not use floor polish or wax that makes floors slippery. If you must use wax, use non-skid floor wax. Do not have throw rugs and other things on the floor that can make you trip. What can I do with my stairs? Do not leave any items on the stairs. Make sure that there are handrails on both sides of the stairs and use them. Fix handrails that are broken or loose. Make sure that handrails are as long as the stairways. Check any carpeting to make sure that it is firmly attached to the stairs. Fix any carpet that is loose or worn. Avoid having throw rugs at the top or bottom of the stairs. If you do have throw  rugs, attach them to the floor with carpet tape. Make sure that you have a light switch at the top of the stairs and the bottom of the stairs. If you do not have them, ask someone to add them for you. What else can I do to help prevent falls? Wear shoes that: Do not have high heels. Have rubber bottoms. Are comfortable and fit you well. Are closed at the toe. Do not wear sandals. If you use a stepladder: Make sure that it is fully opened. Do not climb a closed stepladder. Make sure that both sides of the stepladder are locked into place. Ask someone to hold it for you, if possible. Clearly mark and make sure that you can see: Any grab bars or handrails. First and last steps. Where the edge of each step is. Use tools that help you  move around (mobility aids) if they are needed. These include: Canes. Walkers. Scooters. Crutches. Turn on the lights when you go into a dark area. Replace any light bulbs as soon as they burn out. Set up your furniture so you have a clear path. Avoid moving your furniture around. If any of your floors are uneven, fix them. If there are any pets around you, be aware of where they are. Review your medicines with your doctor. Some medicines can make you feel dizzy. This can increase your chance of falling. Ask your doctor what other things that you can do to help prevent falls. This information is not intended to replace advice given to you by your health care provider. Make sure you discuss any questions you have with your health care provider. Document Released: 12/22/2008 Document Revised: 08/03/2015 Document Reviewed: 04/01/2014 Elsevier Interactive Patient Education  2017 [employer].
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
## 2021-10-30 ENCOUNTER — Other Ambulatory Visit (INDEPENDENT_AMBULATORY_CARE_PROVIDER_SITE_OTHER): Payer: Medicare Other

## 2021-10-30 DIAGNOSIS — E785 Hyperlipidemia, unspecified: Secondary | ICD-10-CM | POA: Diagnosis not present

## 2021-10-30 DIAGNOSIS — M85859 Other specified disorders of bone density and structure, unspecified thigh: Secondary | ICD-10-CM

## 2021-10-30 DIAGNOSIS — E611 Iron deficiency: Secondary | ICD-10-CM | POA: Diagnosis not present

## 2021-10-30 DIAGNOSIS — E1169 Type 2 diabetes mellitus with other specified complication: Secondary | ICD-10-CM | POA: Diagnosis not present

## 2021-10-30 DIAGNOSIS — E1149 Type 2 diabetes mellitus with other diabetic neurological complication: Secondary | ICD-10-CM

## 2021-10-30 DIAGNOSIS — E538 Deficiency of other specified B group vitamins: Secondary | ICD-10-CM

## 2021-10-31 LAB — CBC WITH DIFFERENTIAL/PLATELET
Basophils Absolute: 0 10*3/uL (ref 0.0–0.1)
Basophils Relative: 0.5 % (ref 0.0–3.0)
Eosinophils Absolute: 0.4 10*3/uL (ref 0.0–0.7)
Eosinophils Relative: 5.4 % — ABNORMAL HIGH (ref 0.0–5.0)
HCT: 39.4 % (ref 36.0–46.0)
Hemoglobin: 12.9 g/dL (ref 12.0–15.0)
Lymphocytes Relative: 27 % (ref 12.0–46.0)
Lymphs Abs: 1.8 10*3/uL (ref 0.7–4.0)
MCHC: 32.8 g/dL (ref 30.0–36.0)
MCV: 92.1 fl (ref 78.0–100.0)
Monocytes Absolute: 0.4 10*3/uL (ref 0.1–1.0)
Monocytes Relative: 5.9 % (ref 3.0–12.0)
Neutro Abs: 4.2 10*3/uL (ref 1.4–7.7)
Neutrophils Relative %: 61.2 % (ref 43.0–77.0)
Platelets: 231 10*3/uL (ref 150.0–400.0)
RBC: 4.28 Mil/uL (ref 3.87–5.11)
RDW: 14.9 % (ref 11.5–15.5)
WBC: 6.8 10*3/uL (ref 4.0–10.5)

## 2021-10-31 LAB — COMPREHENSIVE METABOLIC PANEL
ALT: 13 U/L (ref 0–35)
AST: 15 U/L (ref 0–37)
Albumin: 3.9 g/dL (ref 3.5–5.2)
Alkaline Phosphatase: 69 U/L (ref 39–117)
BUN: 14 mg/dL (ref 6–23)
CO2: 22 mEq/L (ref 19–32)
Calcium: 9 mg/dL (ref 8.4–10.5)
Chloride: 100 mEq/L (ref 96–112)
Creatinine, Ser: 1.54 mg/dL — ABNORMAL HIGH (ref 0.40–1.20)
GFR: 32.12 mL/min — ABNORMAL LOW (ref >60.00)
Glucose, Bld: 461 mg/dL — ABNORMAL HIGH (ref 70–99)
Potassium: 4.6 mEq/L (ref 3.5–5.1)
Sodium: 135 mEq/L (ref 135–145)
Total Bilirubin: 0.5 mg/dL (ref 0.2–1.2)
Total Protein: 6.4 g/dL (ref 6.0–8.3)

## 2021-10-31 LAB — LIPID PANEL
Cholesterol: 102 mg/dL (ref 0–200)
HDL: 34.4 mg/dL — ABNORMAL LOW (ref >39.00)
NonHDL: 67.66
Total CHOL/HDL Ratio: 3
Triglycerides: 280 mg/dL — ABNORMAL HIGH (ref 0.0–149.0)
VLDL: 56 mg/dL — ABNORMAL HIGH (ref 0.0–40.0)

## 2021-10-31 LAB — VITAMIN B12: Vitamin B-12: 759 pg/mL (ref 211–911)

## 2021-10-31 LAB — LDL CHOLESTEROL, DIRECT: Direct LDL: 48 mg/dL

## 2021-10-31 LAB — FERRITIN: Ferritin: 13.2 ng/mL (ref 10.0–291.0)

## 2021-10-31 LAB — VITAMIN D 25 HYDROXY (VIT D DEFICIENCY, FRACTURES): VITD: 29.11 ng/mL — ABNORMAL LOW (ref 30.00–100.00)

## 2021-10-31 LAB — HEMOGLOBIN A1C: Hgb A1c MFr Bld: 9.5 % — ABNORMAL HIGH (ref 4.6–6.5)

## 2021-10-31 LAB — IBC PANEL
Iron: 83 ug/dL (ref 42–145)
Saturation Ratios: 26.6 % (ref 20.0–50.0)
TIBC: 312.2 ug/dL (ref 250.0–450.0)
Transferrin: 223 mg/dL (ref 212.0–360.0)

## 2021-10-31 LAB — MICROALBUMIN / CREATININE URINE RATIO
Creatinine,U: 36.3 mg/dL
Microalb Creat Ratio: 4.7 mg/g (ref 0.0–30.0)
Microalb, Ur: 1.7 mg/dL (ref 0.0–1.9)

## 2021-11-06 ENCOUNTER — Encounter: Payer: Self-pay | Admitting: Family Medicine

## 2021-11-06 ENCOUNTER — Ambulatory Visit (INDEPENDENT_AMBULATORY_CARE_PROVIDER_SITE_OTHER): Payer: Medicare Other | Admitting: Family Medicine

## 2021-11-06 VITALS — BP 128/84 | HR 88 | Temp 97.7°F | Ht 62.5 in | Wt 138.5 lb

## 2021-11-06 DIAGNOSIS — E1169 Type 2 diabetes mellitus with other specified complication: Secondary | ICD-10-CM | POA: Diagnosis not present

## 2021-11-06 DIAGNOSIS — E113413 Type 2 diabetes mellitus with severe nonproliferative diabetic retinopathy with macular edema, bilateral: Secondary | ICD-10-CM

## 2021-11-06 DIAGNOSIS — I10 Essential (primary) hypertension: Secondary | ICD-10-CM | POA: Diagnosis not present

## 2021-11-06 DIAGNOSIS — F332 Major depressive disorder, recurrent severe without psychotic features: Secondary | ICD-10-CM

## 2021-11-06 DIAGNOSIS — L80 Vitiligo: Secondary | ICD-10-CM

## 2021-11-06 DIAGNOSIS — Z0000 Encounter for general adult medical examination without abnormal findings: Secondary | ICD-10-CM | POA: Diagnosis not present

## 2021-11-06 DIAGNOSIS — N183 Chronic kidney disease, stage 3 unspecified: Secondary | ICD-10-CM

## 2021-11-06 DIAGNOSIS — E1149 Type 2 diabetes mellitus with other diabetic neurological complication: Secondary | ICD-10-CM

## 2021-11-06 DIAGNOSIS — M85859 Other specified disorders of bone density and structure, unspecified thigh: Secondary | ICD-10-CM

## 2021-11-06 DIAGNOSIS — Z8673 Personal history of transient ischemic attack (TIA), and cerebral infarction without residual deficits: Secondary | ICD-10-CM

## 2021-11-06 DIAGNOSIS — I5032 Chronic diastolic (congestive) heart failure: Secondary | ICD-10-CM | POA: Diagnosis not present

## 2021-11-06 DIAGNOSIS — K219 Gastro-esophageal reflux disease without esophagitis: Secondary | ICD-10-CM

## 2021-11-06 DIAGNOSIS — E538 Deficiency of other specified B group vitamins: Secondary | ICD-10-CM | POA: Diagnosis not present

## 2021-11-06 DIAGNOSIS — K760 Fatty (change of) liver, not elsewhere classified: Secondary | ICD-10-CM

## 2021-11-06 DIAGNOSIS — Z7189 Other specified counseling: Secondary | ICD-10-CM

## 2021-11-06 DIAGNOSIS — Z951 Presence of aortocoronary bypass graft: Secondary | ICD-10-CM

## 2021-11-06 DIAGNOSIS — E611 Iron deficiency: Secondary | ICD-10-CM

## 2021-11-06 DIAGNOSIS — I25118 Atherosclerotic heart disease of native coronary artery with other forms of angina pectoris: Secondary | ICD-10-CM

## 2021-11-06 DIAGNOSIS — R413 Other amnesia: Secondary | ICD-10-CM

## 2021-11-06 DIAGNOSIS — E785 Hyperlipidemia, unspecified: Secondary | ICD-10-CM

## 2021-11-06 DIAGNOSIS — E1122 Type 2 diabetes mellitus with diabetic chronic kidney disease: Secondary | ICD-10-CM

## 2021-11-06 DIAGNOSIS — L299 Pruritus, unspecified: Secondary | ICD-10-CM

## 2021-11-06 MED ORDER — GLIPIZIDE 10 MG PO TABS: 10.0000 mg | ORAL_TABLET | Freq: Two times a day (BID) | ORAL | 3 refills | Status: DC

## 2021-11-06 NOTE — Progress Notes (Unsigned)
Patient ID: Maria Fox, female    DOB: 05/30/1945, 76 y.o.   MRN: [id number]  This visit was conducted in person.  BP 128/84   Pulse 88   Temp 97.7 F (36.5 C) (Temporal)   Ht 5' 2.5" (1.588 m)   Wt 138 lb 8 oz (62.8 kg)   SpO2 99%   BMI 24.93 kg/m    CC: CPE Subjective:   HPI: Maria Fox is a 76 y.o. female presenting on 11/06/2021 for Annual Exam [facility] prt 2.)   I last saw patient 01/2021. Multiple missed office visits and late cancellations in interim.  Saw health advisor earlier this month for medicare wellness visit. Note reviewed. Screened positive for memory. She notes worsening memory trouble.     10/25/2021   11:09 AM  6CIT Screen  What Year? 4 points  What month? 3 points  What time? 3 points  Count back from 20 2 points  Months in reverse 0 points  Repeat phrase 0 points  Total Score 12 points   No results found.  Flowsheet Row Clinical Support from 10/25/2021 in [place] [facility] [place]  PHQ-2 Total Score 5          06/06/2020    3:37 PM 12/08/2018    3:42 PM 12/26/2016   11:28 AM  [place] in the past year? 0 0 Yes  Comment   pt slipped on water/oil spot in parking lot of [employer]; bruise to left palm  Number falls in past yr: 0 0 1  Injury with Fall? 0  Yes  Risk for fall due to : Medication side effect;Impaired balance/gait Medication side effect   Follow up Falls evaluation completed;Falls prevention discussed Falls evaluation completed;Falls prevention discussed    DM - she stopped insulin shots - doesn't want insulin, "can't do it". Also not checking sugar levels. She continues taking farxiga 29m daily, glipizide 558mtwice daily, metformin XR 50062m tablets in am,  She uses glucerna supplements.   She never started [employer]  Feels [employer] packaged pills has helped compliance with medications. She has not been taking vitamin B12 or D  Skin and palms of hands stay very itchy. No rash.  She is considering  moving into assisted living.   Preventative: ESOPHAGOGASTRODUODENOSCOPY 03/2019 - gastric atrophy, benign biopsy ([place]) COLONOSCOPY 03/2019 - TAx1, diverticulosis ([place])       Breast cancer screening - overdue. Advised to call to schedule mammo.  Well woman exam - desires to age out. Denies pelvic pain or vaginal bleeding DEXA T -1.8 hip (01/2017) osteopenia. She is not taking calcium or vitamin D supplements.  Lung cancer screening - not eligible  Flu shot yearly  COVID vaccine Moderna 03/2020 x1 - felt ill so didn't complete  Td 2005  Prevnar-13 2018, presumed pneumovax 2012 Shingrix - discussed - to check at pharmacy - she had bad shingles illness 1990s (hospitalized) Advanced directive discussion - does not have set up. Would want TedBarth Kirks brother DouMarden Noble be HCPOA. Packet provided previously.  Seat belt use discussed Sunscreen use discussed, no changing moles on skin.  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 Interim medical history since our last visit reviewed. Allergies and medications reviewed and updated. Outpatient Medications Prior to Visit  Medication Sig Dispense Refill   acetaminophen (TYLENOL) 500 MG tablet Take 500 mg by mouth as needed for moderate pain.     albuterol (PROAIR HFA) 108 (90 Base) MCG/ACT inhaler Inhale 2 puffs into the lungs every 6 (six) hours as needed for wheezing or shortness of breath. 18 g 0   Aloe Vera 72 % CREA Apply to burned areas four times daily. 114 g 0   atorvastatin (LIPITOR) 40 MG tablet TAKE ONE TABLET BY MOUTH  EVERY EVENING 90 tablet 0   Blood Glucose Monitoring Suppl (ONE TOUCH ULTRA 2) w/Device KIT Use as instructed to check blood sugar 2 times daily as needed. 1 kit 0   clopidogrel (PLAVIX) 75 MG tablet Take 1 tablet (75 mg total) by mouth daily. 30 tablet 1   Continuous Blood Gluc Receiver (FREESTYLE LIBRE 2 READER) DEVI Use with sensors to monitor sugar continuously 1 each 0   Continuous Blood Gluc Sensor (FREESTYLE LIBRE 2 SENSOR) MISC Apply sensor every 14 days to monitor sugar continously 2 each 5   Cyanocobalamin (VITAMIN B12) 500 MCG TABS Take 1 tablet by mouth daily.     dapagliflozin propanediol (FARXIGA) 5 MG TABS tablet TAKE ONE TABLET BY MOUTH EVERY MORNING 30 tablet 0   docusate sodium (COLACE) 100 MG capsule Take 1 capsule (100 mg total) by mouth daily. (Patient taking differently: Take 100 mg by mouth daily as needed for mild constipation.) 30 capsule 6   doxycycline (VIBRAMYCIN) 100 MG capsule Take 1 capsule (100 mg total) by mouth 2 (two) times daily. 20 capsule 0   DULoxetine (CYMBALTA) 30 MG capsule TAKE ONE CAPSULE BY MOUTH EVERY MORNING 90 capsule 0   ELIQUIS 5 MG TABS tablet TAKE ONE TABLET BY MOUTH TWICE DAILY 184 tablet 0   feeding supplement, GLUCERNA SHAKE, (GLUCERNA SHAKE) LIQD Take 237 mLs by mouth 3 (three) times daily between meals.  0   furosemide (LASIX) 20 MG tablet Take 1 tablet (20 mg total) by mouth daily as needed for fluid or edema (leg swelling). 30 tablet    gabapentin (NEURONTIN) 300 MG capsule TAKE ONE CAPSULE BY MOUTH TWICE DAILY 180 capsule 1   glipiZIDE (GLUCOTROL) 5 MG tablet TAKE ONE TABLET BY MOUTH TWICE DAILY 180 tablet 0   glucose blood (ONETOUCH ULTRA) test strip Use as instructed to check blood sugar 2 times daily as needed 100 strip 3   isosorbide mononitrate (IMDUR) 30 MG 24 hr tablet TAKE 1/2 TABLET BY MOUTH ONCE DAILY Please keep upcoming appt with Dr Radford Pax 09/24/21 for further refills. Thank you. 15 tablet 0   latanoprost (XALATAN) 0.005 %  ophthalmic solution Place 1 drop into both eyes at bedtime.      metFORMIN (GLUCOPHAGE-XR) 500 MG 24 hr tablet TAKE TWO TABLETS BY MOUTH EVERY MORNING 180 tablet 0   metoprolol succinate (TOPROL-XL) 25 MG 24 hr tablet TAKE ONE TABLET BY MOUTH ONCE DAILY 90 tablet 0   nitroGLYCERIN (NITROSTAT) 0.4 MG SL tablet Place 1 tablet (0.4 mg total) under the tongue every 5 (five) minutes x 3 doses as needed for chest pain. 25 tablet 1   ondansetron (ZOFRAN-ODT) 4 MG disintegrating tablet TAKE ONE TABLET BY MOUTH every EIGHT hours AS NEEDED FOR NAUSEA AND VOMITING 20 tablet 0  OneTouch Delica Lancets 45W MISC Use as directed to check blood sugar 2 times daily as needed 100 each 3   pantoprazole (PROTONIX) 40 MG tablet TAKE ONE TABLET BY MOUTH TWICE DAILY 180 tablet 0   polyethylene glycol (MIRALAX / GLYCOLAX) 17 g packet Take 17 g by mouth daily as needed for moderate constipation. 14 each 0   metoCLOPramide (REGLAN) 10 MG tablet Take 1 tablet (10 mg total) by mouth 4 (four) times daily -  before meals and at bedtime. 120 tablet 0   No facility-administered medications prior to visit.     Per HPI unless specifically indicated in ROS section below Review of Systems  Constitutional:  Negative for activity change, appetite change, chills, fatigue, fever and unexpected weight change.  HENT:  Negative for hearing loss.   Eyes:  Negative for visual disturbance.  Respiratory:  Positive for cough (last week, now resolved). Negative for chest tightness, shortness of breath and wheezing.   Cardiovascular:  Negative for chest pain, palpitations and leg swelling.  Gastrointestinal:  Positive for nausea. Negative for abdominal distention, abdominal pain, blood in stool, constipation, diarrhea and vomiting.  Genitourinary:  Negative for difficulty urinating and hematuria.  Musculoskeletal:  Negative for arthralgias, myalgias and neck pain.  Skin:  Negative for rash.  Neurological:  Negative for dizziness, seizures,  syncope and headaches.  Hematological:  Negative for adenopathy. Bruises/bleeds easily.  Psychiatric/Behavioral:  Positive for dysphoric mood. The patient is nervous/anxious.     Objective:  BP 128/84   Pulse 88   Temp 97.7 F (36.5 C) (Temporal)   Ht 5' 2.5" (1.588 m)   Wt 138 lb 8 oz (62.8 kg)   SpO2 99%   BMI 24.93 kg/m   Wt Readings from Last 3 Encounters:  11/06/21 138 lb 8 oz (62.8 kg)  01/26/21 137 lb 7 oz (62.3 kg)  09/08/20 140 lb 3.4 oz (63.6 kg)      Physical Exam Vitals and nursing note reviewed.  Constitutional:      Appearance: Normal appearance. She is not ill-appearing.  HENT:     Head: Normocephalic and atraumatic.     Right Ear: Tympanic membrane, ear canal and external ear normal. There is no impacted cerumen.     Left Ear: Tympanic membrane, ear canal and external ear normal. There is no impacted cerumen.  Eyes:     General:        Right eye: No discharge.        Left eye: No discharge.     Extraocular Movements: Extraocular movements intact.     Conjunctiva/sclera: Conjunctivae normal.     Pupils: Pupils are equal, round, and reactive to light.  Neck:     Thyroid: No thyroid mass or thyromegaly.  Cardiovascular:     Rate and Rhythm: Normal rate and regular rhythm.     Pulses: Normal pulses.     Heart sounds: Normal heart sounds. No murmur heard. Pulmonary:     Effort: Pulmonary effort is normal. No respiratory distress.     Breath sounds: Normal breath sounds. No wheezing, rhonchi or rales.  Abdominal:     General: Bowel sounds are normal. There is no distension.     Palpations: Abdomen is soft. There is no mass.     Tenderness: There is no abdominal tenderness. There is no guarding or rebound.     Hernia: No hernia is present.  Musculoskeletal:     Cervical back: Normal range of motion and neck supple. No rigidity.  Right lower leg: No edema.     Left lower leg: No edema.  Lymphadenopathy:     Cervical: No cervical adenopathy.  Skin:     General: Skin is warm and dry.     Findings: Rash (vitiligo present throughout body) present.  Neurological:     General: No focal deficit present.     Mental Status: She is alert. Mental status is at baseline.  Psychiatric:        Mood and Affect: Mood normal.        Behavior: Behavior normal.       Results for orders placed or performed in visit on 10/30/21  Microalbumin / creatinine urine ratio  Result Value Ref Range   Microalb, Ur 1.7 0.0 - 1.9 mg/dL   Creatinine,U 36.3 mg/dL   Microalb Creat Ratio 4.7 0.0 - 30.0 mg/g  CBC with Differential/Platelet  Result Value Ref Range   WBC 6.8 4.0 - 10.5 K/uL   RBC 4.28 3.87 - 5.11 Mil/uL   Hemoglobin 12.9 12.0 - 15.0 g/dL   HCT 39.4 36.0 - 46.0 %   MCV 92.1 78.0 - 100.0 fl   MCHC 32.8 30.0 - 36.0 g/dL   RDW 14.9 11.5 - 15.5 %   Platelets 231.0 150.0 - 400.0 K/uL   Neutrophils Relative % 61.2 43.0 - 77.0 %   Lymphocytes Relative 27.0 12.0 - 46.0 %   Monocytes Relative 5.9 3.0 - 12.0 %   Eosinophils Relative 5.4 (H) 0.0 - 5.0 %   Basophils Relative 0.5 0.0 - 3.0 %   Neutro Abs 4.2 1.4 - 7.7 K/uL   Lymphs Abs 1.8 0.7 - 4.0 K/uL   Monocytes Absolute 0.4 0.1 - 1.0 K/uL   Eosinophils Absolute 0.4 0.0 - 0.7 K/uL   Basophils Absolute 0.0 0.0 - 0.1 K/uL  Hemoglobin A1c  Result Value Ref Range   Hgb A1c MFr Bld 9.5 (H) 4.6 - 6.5 %  Lipid panel  Result Value Ref Range   Cholesterol 102 0 - 200 mg/dL   Triglycerides 280.0 (H) 0.0 - 149.0 mg/dL   HDL 34.40 (L) >39.00 mg/dL   VLDL 56.0 (H) 0.0 - 40.0 mg/dL   Total CHOL/HDL Ratio 3    NonHDL 67.66   Comprehensive metabolic panel  Result Value Ref Range   Sodium 135 135 - 145 mEq/L   Potassium 4.6 3.5 - 5.1 mEq/L   Chloride 100 96 - 112 mEq/L   CO2 22 19 - 32 mEq/L   Glucose, Bld 461 (H) 70 - 99 mg/dL   BUN 14 6 - 23 mg/dL   Creatinine, Ser 1.54 (H) 0.40 - 1.20 mg/dL   Total Bilirubin 0.5 0.2 - 1.2 mg/dL   Alkaline Phosphatase 69 39 - 117 U/L   AST 15 0 - 37 U/L   ALT 13 0 -  35 U/L   Total Protein 6.4 6.0 - 8.3 g/dL   Albumin 3.9 3.5 - 5.2 g/dL   GFR 32.12 (L) >60.00 mL/min   Calcium 9.0 8.4 - 10.5 mg/dL  VITAMIN D 25 Hydroxy (Vit-D Deficiency, Fractures)  Result Value Ref Range   VITD 29.11 (L) 30.00 - 100.00 ng/mL  IBC panel  Result Value Ref Range   Iron 83 42 - 145 ug/dL   Transferrin 223.0 212.0 - 360.0 mg/dL   Saturation Ratios 26.6 20.0 - 50.0 %   TIBC 312.2 250.0 - 450.0 mcg/dL  Ferritin  Result Value Ref Range   Ferritin 13.2 10.0 - 291.0 ng/mL  Vitamin B12  Result Value Ref Range   Vitamin B-12 759 211 - 911 pg/mL  LDL cholesterol, direct  Result Value Ref Range   Direct LDL 48.0 mg/dL    Assessment & Plan:   Problem List Items Addressed This Visit   None    No orders of the defined types were placed in this encounter.  No orders of the defined types were placed in this encounter.    Patient instructions: Call to schedule mammogram and bone density scan at your convenience: [place] [phone] If interested, check with pharmacy about new 2 shot shingles series (shingrix).  Sugars are very high. Continue metformin, and farxiga. Increase glipizide to 65m twice daily - new dose sent to pharmacy.  Return to see me in 3-4 months.   Follow up plan: No follow-ups on file.  JRia Bush MD

## 2021-11-06 NOTE — Patient Instructions (Addendum)
Call to schedule mammogram and bone density scan at your convenience: [place] [phone] If interested, check with pharmacy about new 2 shot shingles series (shingrix).  Sugars are very high. Continue metformin, and farxiga. Increase glipizide to '10mg'$  twice daily - new dose sent to pharmacy.  Return to see me in 3-4 months.   Health Maintenance for Postmenopausal Women Menopause is a normal process in which your ability to get pregnant comes to an end. This process happens slowly over many months or years, usually between the ages of 43 and 62. Menopause is complete when you have missed your menstrual period for 12 months. It is important to talk with your health care provider about some of the most common conditions that affect women after menopause (postmenopausal women). These include heart disease, cancer, and bone loss (osteoporosis). Adopting a healthy lifestyle and getting preventive care can help to promote your health and wellness. The actions you take can also lower your chances of developing some of these common conditions. What are the signs and symptoms of menopause? During menopause, you may have the following symptoms: Hot flashes. These can be moderate or severe. Night sweats. Decrease in sex drive. Mood swings. Headaches. Tiredness (fatigue). Irritability. Memory problems. Problems falling asleep or staying asleep. Talk with your health care provider about treatment options for your symptoms. Do I need hormone replacement therapy? Hormone replacement therapy is effective in treating symptoms that are caused by menopause, such as hot flashes and night sweats. Hormone replacement carries certain risks, especially as you become older. If you are thinking about using estrogen or estrogen with progestin, discuss the benefits and risks with your health care provider. How can I reduce my risk for heart disease and stroke? The risk of heart disease, heart  attack, and stroke increases as you age. One of the causes may be a change in the body's hormones during menopause. This can affect how your body uses dietary fats, triglycerides, and cholesterol. Heart attack and stroke are medical emergencies. There are many things that you can do to help prevent heart disease and stroke. Watch your blood pressure High blood pressure causes heart disease and increases the risk of stroke. This is more likely to develop in people who have high blood pressure readings or are overweight. Have your blood pressure checked: Every 3-5 years if you are 64-35 years of age. Every year if you are 14 years old or older. Eat a healthy diet  Eat a diet that includes plenty of vegetables, fruits, low-fat dairy products, and lean protein. Do not eat a lot of foods that are high in solid fats, added sugars, or sodium. Get regular exercise Get regular exercise. This is one of the most important things you can do for your health. Most adults should: Try to exercise for at least 150 minutes each week. The exercise should increase your heart rate and make you sweat (moderate-intensity exercise). Try to do strengthening exercises at least twice each week. Do these in addition to the moderate-intensity exercise. Spend less time sitting. Even light physical activity can be beneficial. Other tips Work with your health care provider to achieve or maintain a healthy weight. Do not use any products that contain nicotine or tobacco. These products include cigarettes, chewing tobacco, and vaping devices, such as e-cigarettes. If you need help quitting, ask your health care provider. Know your numbers. Ask your health care provider to check your cholesterol and your blood sugar (glucose). Continue to have your  blood tested as directed by your health care provider. Do I need screening for cancer? Depending on your health history and family history, you may need to have cancer screenings at  different stages of your life. This may include screening for: Breast cancer. Cervical cancer. Lung cancer. Colorectal cancer. What is my risk for osteoporosis? After menopause, you may be at increased risk for osteoporosis. Osteoporosis is a condition in which bone destruction happens more quickly than new bone creation. To help prevent osteoporosis or the bone fractures that can happen because of osteoporosis, you may take the following actions: If you are 42-20 years old, get at least 1,000 mg of calcium and at least 600 international units (IU) of vitamin D per day. If you are older than age 16 but younger than age 17, get at least 1,200 mg of calcium and at least 600 international units (IU) of vitamin D per day. If you are older than age 23, get at least 1,200 mg of calcium and at least 800 international units (IU) of vitamin D per day. Smoking and drinking excessive alcohol increase the risk of osteoporosis. Eat foods that are rich in calcium and vitamin D, and do weight-bearing exercises several times each week as directed by your health care provider. How does menopause affect my mental health? Depression may occur at any age, but it is more common as you become older. Common symptoms of depression include: Feeling depressed. Changes in sleep patterns. Changes in appetite or eating patterns. Feeling an overall lack of motivation or enjoyment of activities that you previously enjoyed. Frequent crying spells. Talk with your health care provider if you think that you are experiencing any of these symptoms. General instructions See your health care provider for regular wellness exams and vaccines. This may include: Scheduling regular health, dental, and eye exams. Getting and maintaining your vaccines. These include: Influenza vaccine. Get this vaccine each year before the flu season begins. Pneumonia vaccine. Shingles vaccine. Tetanus, diphtheria, and pertussis (Tdap) booster  vaccine. Your health care provider may also recommend other immunizations. Tell your health care provider if you have ever been abused or do not feel safe at home. Summary Menopause is a normal process in which your ability to get pregnant comes to an end. This condition causes hot flashes, night sweats, decreased interest in sex, mood swings, headaches, or lack of sleep. Treatment for this condition may include hormone replacement therapy. Take actions to keep yourself healthy, including exercising regularly, eating a healthy diet, watching your weight, and checking your blood pressure and blood sugar levels. Get screened for cancer and depression. Make sure that you are up to date with all your vaccines. This information is not intended to replace advice given to you by your health care provider. Make sure you discuss any questions you have with your health care provider. Document Revised: 07/17/2020 Document Reviewed: 07/17/2020 Elsevier Patient Education  [place].

## 2021-11-06 NOTE — Assessment & Plan Note (Signed)
Preventative protocols reviewed and updated unless pt declined. Discussed healthy diet and lifestyle.  

## 2021-11-06 NOTE — Assessment & Plan Note (Signed)
Advanced directive discussion - does not have set up. Would want Maria Fox or brother Maria Fox to be HCPOA. Packet provided previously.

## 2021-11-07 DIAGNOSIS — R413 Other amnesia: Secondary | ICD-10-CM | POA: Insufficient documentation

## 2021-11-07 NOTE — Assessment & Plan Note (Signed)
Iron levels normal, ferritin low, off replacement.

## 2021-11-07 NOTE — Assessment & Plan Note (Signed)
?  CKD related.

## 2021-11-07 NOTE — Assessment & Plan Note (Signed)
Continue lasix and farxiga.  Overdue for cards f/u.

## 2021-11-07 NOTE — Assessment & Plan Note (Signed)
Overall stable period on duloxetine '30mg'$  daily.

## 2021-11-07 NOTE — Assessment & Plan Note (Addendum)
Fibrosis 4 Score = 1.4      Fib-4 interpretation is not validated for people under 29 or over 78 years of age.

## 2021-11-07 NOTE — Assessment & Plan Note (Signed)
Continue pantoprazole daily.

## 2021-11-07 NOTE — Assessment & Plan Note (Signed)
She will call and schedule rpt DEXA. Not taking calcium or vit D at this time.

## 2021-11-07 NOTE — Assessment & Plan Note (Signed)
Discussed regular sunscreen use.

## 2021-11-07 NOTE — Assessment & Plan Note (Signed)
Chronic, uncontrolled.  She does regularly take oral medications.  She has not been taking daily Lantus and feels unable to do this.  Hesitant to use weekly GLP1 RA's with her previous history of nausea and vomiting, hesitant to increase metformin or farxiga dose due to CKD. Will max glipizide '10mg'$  BID with meals. I did ask her to return in 3 months for diabetes follow up visit.

## 2021-11-07 NOTE — Assessment & Plan Note (Deleted)
Appreciate cardiology care.  °

## 2021-11-07 NOTE — Assessment & Plan Note (Signed)
Chronic, stable - continue current regimen of farxiga, imdur, toprol XL.

## 2021-11-07 NOTE — Assessment & Plan Note (Signed)
Levels normal off replacement.

## 2021-11-07 NOTE — Assessment & Plan Note (Addendum)
Chronic, stable on atorvastatin - continue. Marked triglyceride elevation likely due to hyperglycemia.  The ASCVD Risk score (Arnett DK, et al., 2019) failed to calculate for the following reasons:   The patient has a prior MI or stroke diagnosis

## 2021-11-07 NOTE — Assessment & Plan Note (Signed)
Positive screen for memory deficit with 6CIT score 12.  Will further evaluate at 3 mo f/u visit

## 2021-11-07 NOTE — Assessment & Plan Note (Signed)
New over the past year, anticipate diabetic related. Continue farxiga. This limits farxiga and metformin dosing.

## 2021-11-08 ENCOUNTER — Telehealth: Payer: Self-pay

## 2021-11-08 NOTE — Chronic Care Management (AMB) (Signed)
Contacted the patient about medication increase of Glipizide to '10mg'$  BID. Patient requests to wait for the next delivery of medications to add this increase dose of Glipizide.  Charlene Brooke, CPP notified  Avel Sensor, [place]  [phone]

## 2021-11-15 ENCOUNTER — Telehealth: Payer: Self-pay

## 2021-11-15 ENCOUNTER — Other Ambulatory Visit: Payer: Self-pay | Admitting: Family Medicine

## 2021-11-15 ENCOUNTER — Other Ambulatory Visit: Payer: Self-pay | Admitting: Cardiology

## 2021-11-15 NOTE — Chronic Care Management (AMB) (Signed)
Chronic Care Management Pharmacy Assistant   Name: Maria Fox  MRN: [id number] DOB: Mar 23, 1945   Reason for Encounter: Reminder Call   Conditions to be addressed/monitored: CAD, HTN, HLD, DMII, and CKD Stage 3    Medications: Outpatient Encounter Medications as of 11/15/2021  Medication Sig Note   acetaminophen (TYLENOL) 500 MG tablet Take 500 mg by mouth as needed for moderate pain.    albuterol (PROAIR HFA) 108 (90 Base) MCG/ACT inhaler Inhale 2 puffs into the lungs every 6 (six) hours as needed for wheezing or shortness of breath.    Aloe Vera 72 % CREA Apply to burned areas four times daily.    atorvastatin (LIPITOR) 40 MG tablet TAKE ONE TABLET BY MOUTH EVERY EVENING    Blood Glucose Monitoring Suppl (ONE TOUCH ULTRA 2) w/Device KIT Use as instructed to check blood sugar 2 times daily as needed.    clopidogrel (PLAVIX) 75 MG tablet Take 1 tablet (75 mg total) by mouth daily.    dapagliflozin propanediol (FARXIGA) 5 MG TABS tablet TAKE ONE TABLET BY MOUTH EVERY MORNING    docusate sodium (COLACE) 100 MG capsule Take 1 capsule (100 mg total) by mouth daily. (Patient taking differently: Take 100 mg by mouth daily as needed for mild constipation.) 07/01/2020: Takes as needed   doxycycline (VIBRAMYCIN) 100 MG capsule Take 1 capsule (100 mg total) by mouth 2 (two) times daily.    DULoxetine (CYMBALTA) 30 MG capsule TAKE ONE CAPSULE BY MOUTH EVERY MORNING    ELIQUIS 5 MG TABS tablet TAKE ONE TABLET BY MOUTH TWICE DAILY    feeding supplement, GLUCERNA SHAKE, (GLUCERNA SHAKE) LIQD Take 237 mLs by mouth 3 (three) times daily between meals.    furosemide (LASIX) 20 MG tablet Take 1 tablet (20 mg total) by mouth daily as needed for fluid or edema (leg swelling).    gabapentin (NEURONTIN) 300 MG capsule TAKE ONE CAPSULE BY MOUTH TWICE DAILY    glipiZIDE (GLUCOTROL) 10 MG tablet Take 1 tablet (10 mg total) by mouth 2 (two) times daily.    glucose blood (ONETOUCH ULTRA) test strip Use as  instructed to check blood sugar 2 times daily as needed    isosorbide mononitrate (IMDUR) 30 MG 24 hr tablet TAKE 1/2 TABLET BY MOUTH ONCE DAILY Please keep upcoming appt with Dr Radford Pax 09/24/21 for further refills. Thank you.    latanoprost (XALATAN) 0.005 % ophthalmic solution Place 1 drop into both eyes at bedtime.     metFORMIN (GLUCOPHAGE-XR) 500 MG 24 hr tablet TAKE TWO TABLETS BY MOUTH EVERY MORNING    metoCLOPramide (REGLAN) 10 MG tablet Take 1 tablet (10 mg total) by mouth 4 (four) times daily -  before meals and at bedtime.    metoprolol succinate (TOPROL-XL) 25 MG 24 hr tablet TAKE ONE TABLET BY MOUTH ONCE DAILY    nitroGLYCERIN (NITROSTAT) 0.4 MG SL tablet Place 1 tablet (0.4 mg total) under the tongue every 5 (five) minutes x 3 doses as needed for chest pain.    ondansetron (ZOFRAN-ODT) 4 MG disintegrating tablet TAKE ONE TABLET BY MOUTH every EIGHT hours AS NEEDED FOR NAUSEA AND VOMITING    OneTouch Delica Lancets 69I MISC Use as directed to check blood sugar 2 times daily as needed    pantoprazole (PROTONIX) 40 MG tablet TAKE ONE TABLET BY MOUTH TWICE DAILY    polyethylene glycol (MIRALAX / GLYCOLAX) 17 g packet Take 17 g by mouth daily as needed for moderate constipation.  No facility-administered encounter medications on file as of 11/15/2021.   Maria Fox was contacted to remind of upcoming telephone visit with Maria Fox on 11/20/21 at 3:45pm. Patient was reminded to have any blood glucose and blood pressure readings available for review at appointment.   Patient confirmed appointment.  Are you having any problems with your medications? No   Do you have any concerns you like to discuss with the pharmacist? No  CCM referral has been placed prior to visit?  Yes   Star Rating Drugs: Medication:  Last Fill: Day Supply Glipizide 42m 10/25/21 30 Metformin xr 5078m8/17/23 30 Farxiga 54m70m8/17/23 30 Atorvastatin 55m34m17/23 30  Recommendations/Changes made from  today's visit: -[facility] [place] insulin now)   Plan: -PCP 06/29/21 -CCM [place] call patient 2 month for DM update -Pharmacist follow up televisit scheduled for 3 months    LindCharlene BrookeP notified  VelmAvel [phone]

## 2021-11-16 ENCOUNTER — Telehealth: Payer: Self-pay

## 2021-11-16 NOTE — Progress Notes (Signed)
Chronic Care Management Pharmacy Assistant   Name: Maria Fox  MRN: [id number] DOB: Sep 04, 1945   Reason for Encounter: Medication Adherence an Delivery Coordination   Recent office visits:  11/06/21-[name] Gutierrez,MD(PCP)-[facility],Sugars are very high. Continue metformin, and farxiga. Increase glipizide to 34m twice daily.  Recent consult visits:  None since last CCM contact   Hospital visits:  None in previous 6 months  Medications: Outpatient Encounter Medications as of 11/16/2021  Medication Sig Note   acetaminophen (TYLENOL) 500 MG tablet Take 500 mg by mouth as needed for moderate pain.    albuterol (PROAIR HFA) 108 (90 Base) MCG/ACT inhaler Inhale 2 puffs into the lungs every 6 (six) hours as needed for wheezing or shortness of breath.    Aloe Vera 72 % CREA Apply to burned areas four times daily.    atorvastatin (LIPITOR) 40 MG tablet TAKE ONE TABLET BY MOUTH EVERY EVENING    Blood Glucose Monitoring Suppl (ONE TOUCH ULTRA 2) w/Device KIT Use as instructed to check blood sugar 2 times daily as needed.    clopidogrel (PLAVIX) 75 MG tablet Take 1 tablet (75 mg total) by mouth daily.    dapagliflozin propanediol (FARXIGA) 5 MG TABS tablet TAKE ONE TABLET BY MOUTH EVERY MORNING    docusate sodium (COLACE) 100 MG capsule Take 1 capsule (100 mg total) by mouth daily. (Patient taking differently: Take 100 mg by mouth daily as needed for mild constipation.) 07/01/2020: Takes as needed   doxycycline (VIBRAMYCIN) 100 MG capsule Take 1 capsule (100 mg total) by mouth 2 (two) times daily.    DULoxetine (CYMBALTA) 30 MG capsule TAKE ONE CAPSULE BY MOUTH EVERY MORNING    ELIQUIS 5 MG TABS tablet TAKE ONE TABLET BY MOUTH TWICE DAILY    feeding supplement, GLUCERNA SHAKE, (GLUCERNA SHAKE) LIQD Take 237 mLs by mouth 3 (three) times daily between meals.    furosemide (LASIX) 20 MG tablet Take 1 tablet (20 mg total) by mouth daily as needed for fluid or edema (leg swelling).    gabapentin  (NEURONTIN) 300 MG capsule TAKE ONE CAPSULE BY MOUTH TWICE DAILY    glipiZIDE (GLUCOTROL) 10 MG tablet Take 1 tablet (10 mg total) by mouth 2 (two) times daily.    glucose blood (ONETOUCH ULTRA) test strip Use as instructed to check blood sugar 2 times daily as needed    isosorbide mononitrate (IMDUR) 30 MG 24 hr tablet Take 1/2 tablet by mouth daily. Must keep upcoming appointment for future refills. Thank you.    latanoprost (XALATAN) 0.005 % ophthalmic solution Place 1 drop into both eyes at bedtime.     metFORMIN (GLUCOPHAGE-XR) 500 MG 24 hr tablet TAKE TWO TABLETS BY MOUTH EVERY MORNING    metoCLOPramide (REGLAN) 10 MG tablet Take 1 tablet (10 mg total) by mouth 4 (four) times daily -  before meals and at bedtime.    metoprolol succinate (TOPROL-XL) 25 MG 24 hr tablet TAKE ONE TABLET BY MOUTH ONCE DAILY    nitroGLYCERIN (NITROSTAT) 0.4 MG SL tablet Place 1 tablet (0.4 mg total) under the tongue every 5 (five) minutes x 3 doses as needed for chest pain.    ondansetron (ZOFRAN-ODT) 4 MG disintegrating tablet TAKE ONE TABLET BY MOUTH every EIGHT hours AS NEEDED FOR NAUSEA AND VOMITING    OneTouch Delica Lancets 350NMISC Use as directed to check blood sugar 2 times daily as needed    pantoprazole (PROTONIX) 40 MG tablet TAKE ONE TABLET BY MOUTH TWICE DAILY  polyethylene glycol (MIRALAX / GLYCOLAX) 17 g packet Take 17 g by mouth daily as needed for moderate constipation.    No facility-administered encounter medications on file as of 11/16/2021.   BP Readings from Last 3 Encounters:  11/06/21 128/84  07/04/21 (!) 157/83  02/06/21 131/81    Lab Results  Component Value Date   HGBA1C 9.5 (H) 10/30/2021      Recent OV, Consult or Hospital visit:  Recent medication changes indicated:   11/06/21-PCP-increase glipizide 77m twice daily.   Last adherence delivery date:10/30/21      Patient is due for next adherence delivery on: 11/28/21  Spoke with patient on 11/16/21 reviewed medications and  coordinated delivery.  This delivery to include: Adherence Packaging  30 Days  Packs: Farxiga 5 mg - 1 tablet daily (1 breakfast) Pantoprazole 40 mg - 1 tablet twice daily (1 breakfast, 1 evening meal) Gabapentin 300 mg- 1 tablet twice daily (1 breakfast, 1 evening meal) Duloxetine 30 mg - 1 capsule daily (1 breakfast) Atorvastatin 40 mg - 1 tablet daily (1 evening meal) Isosorbide Mononitrate 30 mg - 1/2 tablet daily (1/2 breakfast) Glipizide 10 mg - 1 tablet twice daily (1 breakfast, 1 evening meal) Metformin 500 mg ER - 2 tablets ( breakfast) Eliquis 5 mg- 1 tablet twice daily (1 breakfast, 1 evening meal) Metoprolol succinate 25 mg ER - 1 tablet daily  (breakfast) Clopidogrel 730m- Take 1 tablet daily ( breakfast)  VIAL medications: Latanoprost 0.005 % eye drops - 1 drop in each eye daily Ondansetron 34m40mtake 1 tablet every 8 hours    Patient declined the following medications this month: Freestyle libre sensors- patient is not using system   Any concerns about your medications? No  How often do you forget or accidentally miss a dose? Rarely  Do you use a pillbox? No  Is patient in packaging Yes  What is the date on your next pill pack? Out of town  Any concerns or issues with your packaging?satisfied   Refills requested from providers include:metoprolol,pantoprazole,farxiga,isosorbide mono,ondansetron  Confirmed delivery date of 11/28/21, advised patient that pharmacy will contact them the morning of delivery.  Recent blood pressure readings are as follows:patient out of town, none available    Recent blood glucose readings are as follows:patient out of town, none available     Annual wellness visit in last year? Yes Most Recent BP reading : 128/84  11/06/21  If Diabetic: Most recent A1C reading:9.5  10/30/21 Last eye exam / retinopathy screening: overdue Last diabetic foot exam: UTD  Cycle dispensing form sent to [facility] for  review.  LinCharlene BrookePP notified  VelAvel [phone]

## 2021-11-20 ENCOUNTER — Ambulatory Visit (INDEPENDENT_AMBULATORY_CARE_PROVIDER_SITE_OTHER): Payer: Medicare Other | Admitting: Pharmacist

## 2021-11-20 DIAGNOSIS — E113413 Type 2 diabetes mellitus with severe nonproliferative diabetic retinopathy with macular edema, bilateral: Secondary | ICD-10-CM

## 2021-11-20 DIAGNOSIS — N183 Chronic kidney disease, stage 3 unspecified: Secondary | ICD-10-CM

## 2021-11-20 DIAGNOSIS — E1169 Type 2 diabetes mellitus with other specified complication: Secondary | ICD-10-CM

## 2021-11-20 DIAGNOSIS — I25118 Atherosclerotic heart disease of native coronary artery with other forms of angina pectoris: Secondary | ICD-10-CM

## 2021-11-20 DIAGNOSIS — I5032 Chronic diastolic (congestive) heart failure: Secondary | ICD-10-CM

## 2021-11-20 DIAGNOSIS — I4729 Other ventricular tachycardia: Secondary | ICD-10-CM

## 2021-11-20 DIAGNOSIS — F332 Major depressive disorder, recurrent severe without psychotic features: Secondary | ICD-10-CM

## 2021-11-20 DIAGNOSIS — I10 Essential (primary) hypertension: Secondary | ICD-10-CM

## 2021-11-20 NOTE — Progress Notes (Signed)
Chronic Care Management Pharmacy Note  11/20/2021 Name:  Maria Fox MRN:  [id number] DOB:  01-Feb-1946  Summary: CCM F/U visit -Pt endorses compliance with pill packs -Acute: Pt reports feeling fatigued, nauseated for 2 days and planning to go to ER tonight; she denies alarm symptom shortness of breath, chest pain, dizziness, weakness.  -DM: A1c 9.5% (10/2021), glipizide was increased to 10 mg BID which will begin with September pill packs. She is not compliant with glucose testing and has not set up [facility] (which has been sent to her home), she declines office visit for CGM training -Aflutter/hx DVT: pt has been on Eliquis since DVT 07/2019, initially planned for 6 month duration but pt was lost to follow up and has continued. Aflutter also occurred in acute setting of DVT/PE, unclear if this is a chronic issue. Unclear if Eliquis is still needed.   Recommendations/Changes made from today's visit: -Advised pt to go to urgent care instead of ER for acute symptoms; also offered appt at PCP office but pt declined -Consider home visit for CGM training in future -Advised pt to make f/u appt with cardiologist to re-evaluate A Flutter/Eliquis   Plan: -[place] will call patient monthly for medication adherence -Pharmacist follow up televisit scheduled for 1 month -PCP F/U 02/06/22    Subjective: Maria Fox is an 76 y.o. year old female who is a primary patient of [name], MD.  The CCM team was consulted for assistance with disease management and care coordination needs.    Engaged with patient by telephone for follow up visit in response to provider referral for pharmacy case management and/or care coordination services.   Consent to Services:  The patient was given information about Chronic Care Management services, agreed to services, and gave verbal consent prior to initiation of services.  Please see initial visit note for detailed documentation.    Patient Care Team: [name], MD as PCP - General (Family Medicine) Sueanne Margarita, MD as PCP - Cardiology (Cardiology) Charlton Haws, [facility] as Pharmacist (Pharmacist)  Recent office visits: 11/06/21 Dr [name] OV: annual - Maximize glipizide to 10 mg BID. RTC 3 months for A1c. New CKD stage 3. Schedule DEXA and mammogram.  01/26/21 Dr [name] OV: f/u DM. Pt has been noncompliant with Lantus, she is taking pills. Restart Lantus. Restart B12 500 mcg daily. GFR worsened  Recent consult visits: 07/04/21 Urgent Care - finger fracture 02/06/21 Urgent Care - vaginal abscess. Rx'd doxycycline  Hospital visits: None in previous 6 months   Objective:  Lab Results  Component Value Date   CREATININE 1.54 (H) 10/30/2021   BUN 14 10/30/2021   GFR 32.12 (L) 10/30/2021   GFRNONAA >60 08/01/2020   GFRAA 41 (L) 01/28/2020   NA 135 10/30/2021   K 4.6 10/30/2021   CALCIUM 9.0 10/30/2021   CO2 22 10/30/2021   GLUCOSE 461 (H) 10/30/2021    Lab Results  Component Value Date/Time   HGBA1C 9.5 (H) 10/30/2021 02:52 PM   HGBA1C 8.3 (A) 01/26/2021 03:50 PM   HGBA1C 11.6 (H) 07/03/2020 09:37 AM   FRUCTOSAMINE 302 (H) 11/10/2015 04:27 PM   GFR 32.12 (L) 10/30/2021 02:52 PM   GFR 37.02 (L) 05/02/2020 03:25 PM   MICROALBUR 1.7 10/30/2021 02:52 PM   MICROALBUR <0.7 05/02/2020 03:25 PM    Last diabetic Eye exam:  Lab Results  Component Value Date/Time   HMDIABEYEEXA Retinopathy (A) 03/23/2020 12:00 AM    Last diabetic Foot exam:  No results found for: "HMDIABFOOTEX"   Lab Results  Component Value Date   CHOL 102 10/30/2021   HDL 34.40 (L) 10/30/2021   LDLCALC 40 05/02/2020   LDLDIRECT 48.0 10/30/2021   TRIG 280.0 (H) 10/30/2021   CHOLHDL 3 10/30/2021       Latest Ref Rng & Units 10/30/2021    2:52 PM 08/01/2020    1:01 AM 07/29/2020   11:37 AM  Hepatic Function  Total Protein 6.0 - 8.3 g/dL 6.4  5.7  7.8   Albumin 3.5 - 5.2 g/dL 3.9  2.7  3.7   AST 0 - 37 U/L _0 ALT 0 - 35 U/L _1 Alk Phosphatase 39 - 117 U/L 69  73  109   Total Bilirubin 0.2 - 1.2 mg/dL 0.5  0.4  1.2     Lab Results  Component Value Date/Time   TSH 1.187 07/15/2020 06:11 AM   TSH 1.247 07/10/2019 10:27 AM   TSH 2.64 05/02/2017 04:13 PM   TSH 3.457 07/08/2014 05:00 AM   FREET4 0.87 07/13/2014 11:18 AM       Latest Ref Rng & Units 10/30/2021    2:52 PM 08/01/2020    1:01 AM 07/31/2020    1:56 AM  CBC  WBC 4.0 - 10.5 K/uL 6.8  7.3  8.4   Hemoglobin 12.0 - 15.0 g/dL 12.9  10.5  10.4   Hematocrit 36.0 - 46.0 % 39.4  31.6  30.9   Platelets 150.0 - 400.0 K/uL 231.0  265  235     Lab Results  Component Value Date/Time   VD25OH 29.11 (L) 10/30/2021 02:52 PM   VD25OH 32.36 05/02/2020 03:25 PM    Clinical ASCVD: Yes  The ASCVD Risk score (Arnett DK, et al., 2019) failed to calculate for the following reasons:   The patient has a prior MI or stroke diagnosis    CHA2DS2/VAS Stroke Risk Points  Current as of 2 days ago (Monday)     7 >= 2 Points: High Risk  1 - 1.99 Points: Medium Risk  0 Points: Low Risk    Last Change: N/A       Points Metrics  1 Has Congestive Heart Failure:  Yes    Current as of 2 days ago (Monday)  1 Has Vascular Disease:  Yes    Current as of 2 days ago (Monday)  1 Has Hypertension:  Yes    Current as of 2 days ago (Monday)  2 Age:  64    Current as of 2 days ago (Monday)  1 Has Diabetes:  Yes    Current as of 2 days ago (Monday)  0 Had Stroke:  No  Had TIA:  No  Had Thromboembolism:  No    Current as of 2 days ago (Monday)  1 Female:  Yes    Current as of 2 days ago (Monday)       10/25/2021   11:16 AM 06/06/2020    3:38 PM 05/02/2020    4:55 PM  Depression screen PHQ 2/9  Decreased Interest _2 Down, Depressed, Hopeless _3 PHQ - 2 Score _4 Altered sleeping 0 0 0  Tired, decreased energy 0 0 2  Change in appetite 0 0 2  Feeling bad or failure about yourself  0 0 2  Trouble concentrating 0 0 1  Moving  slowly or fidgety/restless 0  0 1  Suicidal thoughts 0 0 0  PHQ-9 Score _0 Difficult doing work/chores Somewhat difficult Not difficult at all Very difficult     Social History   Tobacco Use  Smoking Status Never  Smokeless Tobacco Never   BP Readings from Last 3 Encounters:  11/06/21 128/84  07/04/21 (!) 157/83  02/06/21 131/81   Pulse Readings from Last 3 Encounters:  11/06/21 88  07/04/21 73  02/06/21 94   Wt Readings from Last 3 Encounters:  11/06/21 138 lb 8 oz (62.8 kg)  01/26/21 137 lb 7 oz (62.3 kg)  09/08/20 140 lb 3.4 oz (63.6 kg)   BMI Readings from Last 3 Encounters:  11/06/21 24.93 kg/m  01/26/21 24.35 kg/m  09/08/20 24.84 kg/m    Assessment/Interventions: Review of patient past medical history, allergies, medications, health status, including review of consultants reports, laboratory and other test data, was performed as part of comprehensive evaluation and provision of chronic care management services.   SDOH:  (Social Determinants of Health) assessments and interventions performed: No - done 10/2021 SDOH Interventions    Flowsheet Row Clinical Support from 10/25/2021 in [place] at [place] Management from 06/14/2021 in [place] at [place] from 06/06/2020 in [place] at [place] Management from 05/02/2020 in [place] at [place] Management from 06/21/2019 in [place] at [facility] Visit from 12/11/2018 in [place] at [place] Interventions Intervention Not Indicated Intervention Not Indicated -- -- -- --  Housing Interventions Intervention Not Indicated -- -- -- -- --  Transportation Interventions Intervention Not Indicated -- -- -- -- --  Depression Interventions/Treatment  Currently on Treatment -- PHQ2-9 Score <4 Follow-up Not Indicated Medication -- Medication   Financial Strain Interventions Intervention Not Indicated Intervention Not Indicated -- Intervention Not Indicated  [Medications affordable] --  [Applying for low income subsidy] --  Physical Activity Interventions Intervention Not Indicated -- -- -- -- --  Stress Interventions Intervention Not Indicated -- -- -- -- --  Social Connections Interventions Intervention Not Indicated -- -- -- -- --      SDOH Screenings   Food Insecurity: No Food Insecurity (10/25/2021)  Housing: Low Risk  (10/25/2021)  Transportation Needs: No Transportation Needs (10/25/2021)  Alcohol Screen: Low Risk  (10/25/2021)  Depression (PHQ2-9): Medium Risk (10/25/2021)  Financial Resource Strain: Low Risk  (10/25/2021)  Physical Activity: Inactive (10/25/2021)  Social Connections: Unknown (10/25/2021)  Stress: No Stress Concern Present (10/25/2021)  Tobacco Use: Low Risk  (11/06/2021)    CCM Care Plan  Allergies  Allergen Reactions   Bee Venom     Throat swelling   Mushroom Extract Complex Anaphylaxis   Penicillins Anaphylaxis, Hives and Swelling    Tolerates cephalosporins including cephalexin multiple times.  Has patient had a PCN reaction causing immediate rash, facial/tongue/throat swelling, SOB or lightheadedness with hypotension: Yes Has patient had a PCN reaction causing severe rash involving mucus membranes or skin necrosis: Yes Has patient had a PCN reaction that required hospitalization Yes Has patient had a PCN reaction occurring within the last 10 years: No     Codeine Nausea And Vomiting   Sulfa Antibiotics Nausea And Vomiting   Iohexol Itching and Swelling   Erythromycin Base Rash    Medications Reviewed Today     Reviewed by Charlton Haws, [facility] (Pharmacist) on 11/20/21 at 1616  Med  List Status: <None>   Medication Order Taking? Sig Documenting Provider Last Dose Status Informant  acetaminophen (TYLENOL) 500 MG tablet [id number] Yes Take 500 mg by mouth as needed for moderate pain.  [provider] Taking Active   albuterol (PROAIR HFA) 108 (90 Base) MCG/ACT inhaler [id number] Yes Inhale 2 puffs into the lungs every 6 (six) hours as needed for wheezing or shortness of breath. [name], MD Taking Active   Aloe Vera 72 % CREA [id number] Yes Apply to burned areas four times daily. Maudie Flakes, MD Taking Active   atorvastatin (LIPITOR) 40 MG tablet [id number] Yes TAKE ONE TABLET BY MOUTH EVERY Recardo Evangelist, MD Taking Active   Blood Glucose Monitoring Suppl (ONE TOUCH ULTRA 2) w/Device KIT [id number] Yes Use as instructed to check blood sugar 2 times daily as needed. [name], MD Taking Active   clopidogrel (PLAVIX) 75 MG tablet [id number] Yes Take 1 tablet (75 mg total) by mouth daily. Sueanne Margarita, MD Taking Active   dapagliflozin propanediol (FARXIGA) 5 MG TABS tablet [id number] Yes TAKE ONE TABLET BY MOUTH EVERY MORNING [name], MD Taking Active   docusate sodium (COLACE) 100 MG capsule [id number] Yes Take 1 capsule (100 mg total) by mouth daily.  Patient taking differently: Take 100 mg by mouth daily as needed for mild constipation.   [name], MD Taking Active            Med Note Sarajane Jews, CALLIE   Sat Jul 01, 2020  1:47 PM) Dewaine Conger as needed  doxycycline (VIBRAMYCIN) 100 MG capsule [id number] Yes Take 1 capsule (100 mg total) by mouth 2 (two) times daily. Terrilee Croak, PA-C Taking Active   DULoxetine (CYMBALTA) 30 MG capsule [id number] Yes TAKE ONE CAPSULE BY MOUTH EVERY MORNING [name], MD Taking Active   ELIQUIS 5 MG TABS tablet [id number] Yes TAKE ONE TABLET BY MOUTH TWICE DAILY [name], MD Taking Active   feeding supplement, GLUCERNA SHAKE, ([place]) LIQD [id number] Yes Take 237 mLs by mouth 3 (three) times daily between meals. Mariel Aloe, MD Taking Active   furosemide (LASIX) 20 MG tablet [id number] Yes Take 1 tablet (20 mg total) by mouth daily as needed for fluid or edema (leg  swelling). Arrien, Jimmy Picket, MD Taking Active Self  gabapentin (NEURONTIN) 300 MG capsule [id number] Yes TAKE ONE CAPSULE BY MOUTH TWICE DAILY [name], MD Taking Active   glipiZIDE (GLUCOTROL) 10 MG tablet [id number] Yes Take 1 tablet (10 mg total) by mouth 2 (two) times daily. [name], MD Taking Active   glucose blood [facility] ULTRA) test strip [id number] Yes Use as instructed to check blood sugar 2 times daily as needed [name], MD Taking Active   isosorbide mononitrate (IMDUR) 30 MG 24 hr tablet [id number] Yes Take 1/2 tablet by mouth daily. Must keep upcoming appointment for future refills. Thank you. Sueanne Margarita, MD Taking Active   latanoprost (XALATAN) 0.005 % ophthalmic solution [id number] Yes Place 1 drop into both eyes at bedtime.  [provider] Taking Active Self  metFORMIN (GLUCOPHAGE-XR) 500 MG 24 hr tablet [id number] Yes TAKE TWO TABLETS BY MOUTH EVERY MORNING [name], MD Taking Active   metoCLOPramide (REGLAN) 10 MG tablet [id number]  Take 1 tablet (10 mg total) by mouth 4 (four) times daily -  before meals and at bedtime. Mariel Aloe, MD  Expired 10/17/20 2359   metoprolol succinate (TOPROL-XL) 25 MG 24 hr tablet [id number] Yes TAKE ONE TABLET BY MOUTH ONCE  DAILY [name], MD Taking Active   nitroGLYCERIN (NITROSTAT) 0.4 MG SL tablet [id number] Yes Place 1 tablet (0.4 mg total) under the tongue every 5 (five) minutes x 3 doses as needed for chest pain. Sueanne Margarita, MD Taking Active   ondansetron (ZOFRAN-ODT) 4 MG disintegrating tablet [id number] Yes TAKE ONE TABLET BY MOUTH every EIGHT hours AS NEEDED FOR NAUSEA AND VOMITING [name], MD Taking Active   OneTouch Delica Lancets 36U MISC [id number] Yes Use as directed to check blood sugar 2 times daily as needed [name], MD Taking Active   pantoprazole (PROTONIX) 40 MG tablet [id number] Yes TAKE ONE TABLET BY MOUTH TWICE DAILY [name], MD Taking  Active   polyethylene glycol (MIRALAX / GLYCOLAX) 17 g packet [id number] Yes Take 17 g by mouth daily as needed for moderate constipation. Arrien, Jimmy Picket, MD Taking Active Self            Patient Active Problem List   Diagnosis Date Noted   Memory deficit 11/07/2021   Functional abdominal pain syndrome 07/29/2020   Atypical chest pain 07/04/2020   Low-tension glaucoma, bilateral, severe stage 03/24/2020   Dysuria 11/08/2019   Right sided temporal headache 08/06/2019   Atrial flutter with rapid ventricular response ([place])    Acute deep vein thrombosis (DVT) of left tibial vein ([place]) 07/11/2019   Acute cholecystitis 07/07/2019   Pain of right breast 03/15/2019   Health maintenance examination 12/11/2018   Type 2 diabetes mellitus with severe nonproliferative diabetic retinopathy with macular edema, bilateral ([place]) 07/16/2018   Coronary artery disease of native artery of native heart with stable angina pectoris ([name])    Effort angina ([place]) 11/12/2017   Abnormal stress ECG    Dyspnea on exertion 11/04/2017   GERD (gastroesophageal reflux disease) 08/15/2017   Trigger finger 06/09/2017   Nocturnal leg movements 03/25/2017   Osteopenia 02/01/2017   Advanced care planning/counseling discussion 01/02/2017   Stage 3 chronic kidney disease due to type 2 diabetes mellitus ([place]) 08/26/2016   Insomnia 08/26/2016   Vitamin B12 deficiency 11/10/2015   Epistaxis 07/07/2015   Fatty liver disease, nonalcoholic 16/60/6301   Chronic diastolic CHF (congestive heart failure) ([place]) 08/27/2014   Positive hepatitis C antibody test 08/27/2014   Iron deficiency 08/27/2014   NSVT (nonsustained ventricular tachycardia) ([name])    Nausea without vomiting    Autonomic orthostatic hypotension 07/11/2014   Lower urinary tract infectious disease 07/08/2014   S/P CABG x 3 07/08/2014   History of cerebrovascular accident (CVA) in adulthood 07/08/2014   Hypokalemia 03/23/2014   Type 2 diabetes  mellitus with neurological complications ([place]) 60/12/9321   Itching 03/16/2012   Rash 03/16/2012   Medicare annual wellness visit, initial 10/28/2011   Hyperlipidemia associated with type 2 diabetes mellitus ([place])    Idiopathic angioedema 06/23/2011   Vitiligo 06/23/2011   Dermatitis 06/23/2011   MDD (major depressive disorder), recurrent severe, without psychosis ([place]) 05/08/2006   Hypertension, essential 05/08/2006   MENOPAUSAL SYNDROME 05/08/2006    Immunization History  Administered Date(s) Administered   Fluad Quad(high Dose 65+) 12/11/2018, 01/26/2021   Influenza Split 12/17/2011   Influenza Whole 01/22/2013   Influenza,inj,Quad PF,6+ Mos 11/10/2015, 12/26/2016, 11/26/2017   Moderna Sars-Covid-2 Vaccination 03/21/2020   Pneumococcal Conjugate-13 12/26/2016   Pneumococcal-Unspecified 11/30/2010   Td 08/10/2003    Conditions to be addressed/monitored:  Hypertension, Hyperlipidemia, Diabetes, Atrial Fibrillation, Heart Failure, Coronary Artery Disease, and Depression  Care Plan : [place]  Updates made by Charlton Haws,  [place] since 11/28/2021 12:00 AM     Problem: Hypertension, Hyperlipidemia, Diabetes, Atrial Fibrillation, Heart Failure, Coronary Artery Disease, and Depression   Priority: High     Long-Range Goal: Disease Management   Start Date: 05/03/2020  Expected End Date: 06/19/2022  This Visit's Progress: On track  Recent Progress: On track  Priority: High  Note:   Current Barriers:  Unable to achieve control of diabetes    Pharmacist Clinical Goal(s):  Patient will achieve adherence to monitoring guidelines and medication adherence to achieve therapeutic efficacy through collaboration with PharmD and provider.    Interventions: 1:1 collaboration with [name], MD regarding development and update of comprehensive plan of care as evidenced by provider attestation and co-signature Inter-disciplinary care team collaboration (see  longitudinal plan of care) Comprehensive medication review performed; medication list updated in electronic medical record   Diabetes (A1c goal <7%) -Uncontrolled - A1c 9.5% (10/2021) worsening; pt does not check BG with glucometer and did not set up [employer]; she states "I'm never going to take insulin"; She does affirm taking her oral medications as prescribed. -Current home glucose readings - n/a (misplaced meter, no motivation to check BG) -Current medications: Farxiga 5 mg daily - Appropriate, Effective, Safe, Accessible Metformin 500 mg ER - 2 tab AM- Appropriate, Effective, Safe, Accessible Glipizide 10 mg BID- Appropriate, Effective, Safe, Accessible Freestyle Libre - not using -Medications previously tried: Ozempic (never started; hx of N/V hesitant to use GLP1-RA), Lantus (never started - refuses insulin) -Current meal patterns: poor appetite, only eating once/day -Encouraged use of [employer] which has supply, she declined to come into office for CGM training; consider home visit in future -Recommend to continue current medication   Heart Failure / HTN (Goal: BP < 130/80) -Controlled - pt is not using furosemide often -Last ejection fraction: 60-65% (Date: 06/2020) -HF type: Diastolic (Grade 1); NYHA Class: II (slight limitation of activity) -Follow with cardiology, overdue for appt (last 01/2020) -Current treatment: Farxiga 5 mg daily - Appropriate, Effective, Safe, Accessible Metoprolol succinate 25 mg daily -Appropriate, Effective, Safe, Accessible Furosemide 20 mg PRN  - Appropriate, Effective, Safe, Accessible Isosorbide MN 30 mg - 1/2 tab daily -Appropriate, Effective, Safe, Accessible -Medications previously tried: n/a  -Educated on Benefits of medications for managing symptoms and prolonging life -Recommended to continue current medication; advised to make appt with cardiology  Atrial Flutter (Goal: prevent stroke and major  bleeding) -Controlled -CHADSVASC: 7; Aflutter occurred in s/o of acute PE/DVT 07/2019 -hx of DVT/presumed PE 07/2019, has remained on Eliquis + Plavix since then; overdue for cardiology appt (last 01/2020) -Current treatment: Metoprolol succinate 25 mg daily - Appropriate, Effective, Safe, Accessible Eliquis 5 mg BID - Query Appropriate -Medications previously tried: amiodarone -Reviewed indication for Eliquis - acute DVT/PE treatment was planned for 6 months, then reassess but patient was lost to follow up; A-Flutter was also in s/o of acute PE/DVT, unclear if this is a chronic issue; Recommend re-evaluation per cardiology or PCP to determine if Eliquis is still necessary  Hyperlipidemia/ CAD (LDL goal < 70) -Controlled - LDL 48 (10/2021) at goal -Hx CAD, CABGx3 -Current treatment: Atorvastatin 40 mg daily - Appropriate, Effective, Safe, Accessible Clopidogrel 75 mg daily - Appropriate, Effective, Safe, Accessible Isosorbide MN 30 mg - 1/2 tab daily -Appropriate, Effective, Safe, Accessible -Medications previously tried: n/a  -Educated on Cholesterol goals;  -Recommended to continue current medication  Depression/Anxiety (Goal: Improve symptoms) -Improving  - She has had good and bad days -History of auditory hallucinations  -  PHQ9:  4 (06/06/20) - minimal depression -Current treatment: Duloxetine 30 mg daily - Appropriate, Effective, Safe, Accessible -Medications previously tried/failed: sertraline 2015 (75 mg) --> changed to duloxetine 30, up to 60 mg (2017-2018) --> changed to citalopram 2018 - unknown efficacy. There is note in chart that duloxetine was not effective and thus changed to citalopram. But then later placed back on duloxetine after a psych admission along with trazodone and Geodon. -Declines interest in CBT in the past. Reports this was not effective. -Recommend to continue current medication   Chronic Kidney Disease Stage 3b  -All medications assessed for renal dosing  and appropriateness in chronic kidney disease. -Recommended to continue current medication  Patient Goals/Self-Care Activities Patient will:  - take medications as prescribed - follow up with health care providers     Medication Assistance: None required.  Patient affirms current coverage meets needs. - LIS  Compliance/Adherence/Medication fill history: Care Gaps: Mammogram (due 03/2020) Eye exam (due 03/2021) Urine microalbumin (due 04/2021)  Star-Rating Drugs: Atorvastatin - PDC 100% Glipizide - PDC 100% Metformin - PDC 100% Farxiga - PDC 100%  Medication Access: Within the past 30 days, how often has patient missed a dose of medication? 0 Is a pillbox or other method used to improve adherence? Yes  Factors that may affect medication adherence? no barriers identified Are meds synced by current pharmacy? Yes  Are meds delivered by current pharmacy? Yes  Does patient experience delays in picking up medications due to transportation concerns? No   [facility] Reviewed: Is patient disadvantaged to use [facility]?: No  Current Rx insurance plan: [place] Name and location of Current pharmacy:  [facility] - [place], [place] - [place] [address] [place] [address] Phone: [phone] Fax: [phone]  [id number] [place] [id number]: [phone]: [phone] [facility] services reviewed with patient today?: Yes  30-day Packs: (delivery 11/28/21) FWilder Glade5 mg - 1 tablet daily (1 breakfast) Pantoprazole 40 mg - 1 tablet twice daily (1 breakfast, 1 evening meal) Gabapentin 300 mg- 1 tablet twice daily (1 breakfast, 1 evening meal) Duloxetine 30 mg - 1 capsule daily (1 breakfast) Atorvastatin 40 mg - 1 tablet daily (1 evening meal) Isosorbide Mononitrate 30 mg - 1/2 tablet daily (1/2  breakfast) Glipizide 10 mg - 1 tablet twice daily (1 breakfast, 1 evening meal) Metformin 500 mg ER - 2 tablets ( breakfast) Eliquis 5 mg- 1 tablet twice daily (1 breakfast, 1 evening meal) Metoprolol succinate 25 mg ER - 1 tablet daily  (breakfast) Clopidogrel 763m- Take 1 tablet daily ( breakfast)   VIAL medications: Latanoprost 0.005 % eye drops - 1 drop in each eye daily Ondansetron 74m51mtake 1 tablet every 8 hours   Care Plan and Follow Up Patient Decision:  Patient agrees to Care Plan and Follow-up.  Plan: Telephone follow up appointment with care management team member scheduled for:  1 months  LinCharlene BrookeharmD, BCACP Clinical Pharmacist [phone]

## 2021-11-28 NOTE — Patient Instructions (Signed)
Visit Information  Phone number for Pharmacist: [phone]   Goals Addressed   None     Care Plan : [place]  Updates made by Charlton Haws, RPH since 11/28/2021 12:00 AM     Problem: Hypertension, Hyperlipidemia, Diabetes, Atrial Fibrillation, Heart Failure, Coronary Artery Disease, and Depression   Priority: High     Long-Range Goal: Disease Management   Start Date: 05/03/2020  Expected End Date: 06/19/2022  This Visit's Progress: On track  Recent Progress: On track  Priority: High  Note:   Current Barriers:  Unable to achieve control of diabetes    Pharmacist Clinical Goal(s):  Patient will achieve adherence to monitoring guidelines and medication adherence to achieve therapeutic efficacy through collaboration with PharmD and provider.    Interventions: 1:1 collaboration with [name], MD regarding development and update of comprehensive plan of care as evidenced by provider attestation and co-signature Inter-disciplinary care team collaboration (see longitudinal plan of care) Comprehensive medication review performed; medication list updated in electronic medical record   Diabetes (A1c goal <7%) -Uncontrolled - A1c 9.5% (10/2021) worsening; pt does not check BG with glucometer and did not set up [employer]; she states "I'm never going to take insulin"; She does affirm taking her oral medications as prescribed. -Current home glucose readings - n/a (misplaced meter, no motivation to check BG) -Current medications: Farxiga 5 mg daily - Appropriate, Effective, Safe, Accessible Metformin 500 mg ER - 2 tab AM- Appropriate, Effective, Safe, Accessible Glipizide 10 mg BID- Appropriate, Effective, Safe, Accessible Freestyle Libre - not using -Medications previously tried: Ozempic (never started; hx of N/V hesitant to use GLP1-RA), Lantus (never started - refuses insulin) -Current meal patterns: poor appetite, only eating once/day -Encouraged  use of [employer] which has supply, she declined to come into office for CGM training; consider home visit in future -Recommend to continue current medication   Heart Failure / HTN (Goal: BP < 130/80) -Controlled - pt is not using furosemide often -Last ejection fraction: 60-65% (Date: 06/2020) -HF type: Diastolic (Grade 1); NYHA Class: II (slight limitation of activity) -Follow with cardiology, overdue for appt (last 01/2020) -Current treatment: Farxiga 5 mg daily - Appropriate, Effective, Safe, Accessible Metoprolol succinate 25 mg daily -Appropriate, Effective, Safe, Accessible Furosemide 20 mg PRN  - Appropriate, Effective, Safe, Accessible Isosorbide MN 30 mg - 1/2 tab daily -Appropriate, Effective, Safe, Accessible -Medications previously tried: n/a  -Educated on Benefits of medications for managing symptoms and prolonging life -Recommended to continue current medication; advised to make appt with cardiology  Atrial Flutter (Goal: prevent stroke and major bleeding) -Controlled -CHADSVASC: 7; Aflutter occurred in s/o of acute PE/DVT 07/2019 -hx of DVT/presumed PE 07/2019, has remained on Eliquis + Plavix since then; overdue for cardiology appt (last 01/2020) -Current treatment: Metoprolol succinate 25 mg daily - Appropriate, Effective, Safe, Accessible Eliquis 5 mg BID - Query Appropriate -Medications previously tried: amiodarone -Reviewed indication for Eliquis - acute DVT/PE treatment was planned for 6 months, then reassess but patient was lost to follow up; A-Flutter was also in s/o of acute PE/DVT, unclear if this is a chronic issue; Recommend re-evaluation per cardiology or PCP to determine if Eliquis is still necessary  Hyperlipidemia/ CAD (LDL goal < 70) -Controlled - LDL 48 (10/2021) at goal -Hx CAD, CABGx3 -Current treatment: Atorvastatin 40 mg daily - Appropriate, Effective, Safe, Accessible Clopidogrel 75 mg daily - Appropriate, Effective, Safe, Accessible Isosorbide  MN 30 mg - 1/2 tab daily -Appropriate, Effective, Safe, Accessible -Medications  previously tried: n/a  -Educated on Cholesterol goals;  -Recommended to continue current medication  Depression/Anxiety (Goal: Improve symptoms) -Improving  - She has had good and bad days -History of auditory hallucinations  -PHQ9:  4 (06/06/20) - minimal depression -Current treatment: Duloxetine 30 mg daily - Appropriate, Effective, Safe, Accessible -Medications previously tried/failed: sertraline 2015 (75 mg) --> changed to duloxetine 30, up to 60 mg (2017-2018) --> changed to citalopram 2018 - unknown efficacy. There is note in chart that duloxetine was not effective and thus changed to citalopram. But then later placed back on duloxetine after a psych admission along with trazodone and Geodon. -Declines interest in CBT in the past. Reports this was not effective. -Recommend to continue current medication   Chronic Kidney Disease Stage 3b  -All medications assessed for renal dosing and appropriateness in chronic kidney disease. -Recommended to continue current medication  Patient Goals/Self-Care Activities Patient will:  - take medications as prescribed - follow up with health care providers       The patient verbalized understanding of instructions, educational materials, and care plan provided today and DECLINED offer to receive copy of patient instructions, educational materials, and care plan.  Telephone follow up appointment with pharmacy team member scheduled for: 1 month  Charlene Brooke, PharmD, [facility] Clinical Pharmacist [facility] [phone]
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
## 2021-12-04 ENCOUNTER — Ambulatory Visit: Payer: Medicare Other | Admitting: Physician Assistant

## 2021-12-04 ENCOUNTER — Encounter: Payer: Self-pay | Admitting: Physician Assistant

## 2021-12-08 DIAGNOSIS — F32A Depression, unspecified: Secondary | ICD-10-CM | POA: Diagnosis not present

## 2021-12-08 DIAGNOSIS — Z794 Long term (current) use of insulin: Secondary | ICD-10-CM | POA: Diagnosis not present

## 2021-12-08 DIAGNOSIS — I5032 Chronic diastolic (congestive) heart failure: Secondary | ICD-10-CM | POA: Diagnosis not present

## 2021-12-08 DIAGNOSIS — N1832 Chronic kidney disease, stage 3b: Secondary | ICD-10-CM | POA: Diagnosis not present

## 2021-12-08 DIAGNOSIS — I2510 Atherosclerotic heart disease of native coronary artery without angina pectoris: Secondary | ICD-10-CM | POA: Diagnosis not present

## 2021-12-08 DIAGNOSIS — I10 Essential (primary) hypertension: Secondary | ICD-10-CM

## 2021-12-08 DIAGNOSIS — I4892 Unspecified atrial flutter: Secondary | ICD-10-CM | POA: Diagnosis not present

## 2021-12-08 DIAGNOSIS — E1159 Type 2 diabetes mellitus with other circulatory complications: Secondary | ICD-10-CM

## 2021-12-08 DIAGNOSIS — E785 Hyperlipidemia, unspecified: Secondary | ICD-10-CM

## 2021-12-08 DIAGNOSIS — E1122 Type 2 diabetes mellitus with diabetic chronic kidney disease: Secondary | ICD-10-CM

## 2021-12-14 ENCOUNTER — Other Ambulatory Visit: Payer: Self-pay | Admitting: Family Medicine

## 2021-12-14 ENCOUNTER — Other Ambulatory Visit: Payer: Self-pay | Admitting: Cardiology

## 2021-12-14 DIAGNOSIS — E785 Hyperlipidemia, unspecified: Secondary | ICD-10-CM

## 2021-12-14 NOTE — Telephone Encounter (Signed)
Last office visit 11/06/21 for CPE.  Last refilled 11/16/2021 #20 with no refills. Next Appt: 02/06/22 for DM.

## 2021-12-17 ENCOUNTER — Telehealth: Payer: Self-pay
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
## 2021-12-20 ENCOUNTER — Telehealth: Payer: Self-pay

## 2021-12-20 NOTE — Chronic Care Management (AMB) (Signed)
Chronic Care Management Pharmacy Assistant   Name: Maria Fox  MRN: [id number] DOB: 21-Aug-1945  Reason for Encounter: Reminder Call  Medications: Outpatient Encounter Medications as of 12/20/2021  Medication Sig Note   acetaminophen (TYLENOL) 500 MG tablet Take 500 mg by mouth as needed for moderate pain.    albuterol (PROAIR HFA) 108 (90 Base) MCG/ACT inhaler Inhale 2 puffs into the lungs every 6 (six) hours as needed for wheezing or shortness of breath.    Aloe Vera 72 % CREA Apply to burned areas four times daily.    atorvastatin (LIPITOR) 40 MG tablet TAKE ONE TABLET BY MOUTH EVERY EVENING    Blood Glucose Monitoring Suppl (ONE TOUCH ULTRA 2) w/Device KIT Use as instructed to check blood sugar 2 times daily as needed.    clopidogrel (PLAVIX) 75 MG tablet Take 1 tablet (75 mg total) by mouth daily.    dapagliflozin propanediol (FARXIGA) 5 MG TABS tablet TAKE ONE TABLET BY MOUTH EVERY MORNING    docusate sodium (COLACE) 100 MG capsule Take 1 capsule (100 mg total) by mouth daily. (Patient taking differently: Take 100 mg by mouth daily as needed for mild constipation.) 07/01/2020: Takes as needed   doxycycline (VIBRAMYCIN) 100 MG capsule Take 1 capsule (100 mg total) by mouth 2 (two) times daily.    DULoxetine (CYMBALTA) 30 MG capsule TAKE ONE CAPSULE BY MOUTH EVERY MORNING    ELIQUIS 5 MG TABS tablet TAKE ONE TABLET BY MOUTH TWICE DAILY    feeding supplement, GLUCERNA SHAKE, (GLUCERNA SHAKE) LIQD Take 237 mLs by mouth 3 (three) times daily between meals.    furosemide (LASIX) 20 MG tablet Take 1 tablet (20 mg total) by mouth daily as needed for fluid or edema (leg swelling).    gabapentin (NEURONTIN) 300 MG capsule TAKE ONE CAPSULE BY MOUTH TWICE DAILY    glipiZIDE (GLUCOTROL) 10 MG tablet Take 1 tablet (10 mg total) by mouth 2 (two) times daily.    glucose blood (ONETOUCH ULTRA) test strip Use as instructed to check blood sugar 2 times daily as needed    isosorbide mononitrate  (IMDUR) 30 MG 24 hr tablet TAKE 1/2 TABLET BY MOUTH ONCE DAILY .    latanoprost (XALATAN) 0.005 % ophthalmic solution Place 1 drop into both eyes at bedtime.     metFORMIN (GLUCOPHAGE-XR) 500 MG 24 hr tablet TAKE TWO TABLETS BY MOUTH EVERY MORNING    metoCLOPramide (REGLAN) 10 MG tablet Take 1 tablet (10 mg total) by mouth 4 (four) times daily -  before meals and at bedtime.    metoprolol succinate (TOPROL-XL) 25 MG 24 hr tablet TAKE ONE TABLET BY MOUTH ONCE DAILY    nitroGLYCERIN (NITROSTAT) 0.4 MG SL tablet Dissolve 1 tab under tongue as needed for chest pain. May repeat every 5 minutes x 2 doses. If no relief call 9-1-1.    ondansetron (ZOFRAN-ODT) 4 MG disintegrating tablet TAKE ONE TABLET BY MOUTH every EIGHT hours AS NEEDED FOR NAUSEA AND VOMITING    OneTouch Delica Lancets 96E MISC Use as directed to check blood sugar 2 times daily as needed    pantoprazole (PROTONIX) 40 MG tablet TAKE ONE TABLET BY MOUTH TWICE DAILY    polyethylene glycol (MIRALAX / GLYCOLAX) 17 g packet Take 17 g by mouth daily as needed for moderate constipation.    No facility-administered encounter medications on file as of 12/20/2021.    Maria Fox was contacted to remind of upcoming telephone visit with Maria Fox on  12/25/21 at 3:00. Patient was reminded to have any blood glucose and blood pressure readings available for review at appointment.   Patient confirmed appointment.  Are you having any problems with your medications? No   Do you have any concerns you like to discuss with the pharmacist? No  CCM referral has been placed prior to visit?  Yes   Star Rating Drugs:  [facility] Medication:  Last Fill: Day Supply Atorvastatin 37m 11/23/21 30 Farxiga 575m 11/23/21 30 Glipizide 1045m/15/23 30 Metformin xr 500m40m15/23 30  [place] notified  [phone]

## 2021-12-25 ENCOUNTER — Ambulatory Visit: Payer: Medicare Other | Admitting: Pharmacist

## 2021-12-25 DIAGNOSIS — I5032 Chronic diastolic (congestive) heart failure: Secondary | ICD-10-CM

## 2021-12-25 DIAGNOSIS — E113413 Type 2 diabetes mellitus with severe nonproliferative diabetic retinopathy with macular edema, bilateral: Secondary | ICD-10-CM

## 2021-12-25 DIAGNOSIS — I25118 Atherosclerotic heart disease of native coronary artery with other forms of angina pectoris: Secondary | ICD-10-CM

## 2021-12-25 DIAGNOSIS — N183 Chronic kidney disease, stage 3 unspecified: Secondary | ICD-10-CM

## 2021-12-25 NOTE — Patient Instructions (Signed)
Visit Information  Phone number for Pharmacist: [phone]   Goals Addressed   None     Care Plan : [place]  Updates made by Charlton Haws, RPH since 12/25/2021 12:00 AM     Problem: Hypertension, Hyperlipidemia, Diabetes, Atrial Fibrillation, Heart Failure, Coronary Artery Disease, and Depression   Priority: High     Long-Range Goal: Disease Management   Start Date: 05/03/2020  Expected End Date: 06/19/2022  This Visit's Progress: On track  Recent Progress: On track  Priority: High  Note:   Current Barriers:  Unable to achieve control of diabetes    Pharmacist Clinical Goal(s):  Patient will achieve adherence to monitoring guidelines and medication adherence to achieve therapeutic efficacy through collaboration with PharmD and provider.    Interventions: 1:1 collaboration with [name], MD regarding development and update of comprehensive plan of care as evidenced by provider attestation and co-signature Inter-disciplinary care team collaboration (see longitudinal plan of care) Comprehensive medication review performed; medication list updated in electronic medical record   Diabetes (A1c goal <7%) -Uncontrolled - A1c 9.5% (10/2021) worsening; pt does not check BG with glucometer and did not set up [employer]; she states "I'm never going to take insulin"; She does affirm taking her oral medications as prescribed. -Current home glucose readings - n/a (misplaced meter, no motivation to check BG) -She denies s/sx of hypoglycemia or hyperglycemia -Current medications: Farxiga 5 mg daily - Appropriate, Query Effective Metformin 500 mg ER - 2 tab AM- Appropriate, Query Effective Glipizide 10 mg BID- Appropriate, Query Effective Freestyle Libre - not using -Medications previously tried: Ozempic (never started; hx of N/V hesitant to use GLP1-RA), Lantus (never started - refuses insulin) -Current meal patterns: poor appetite, only eating  once/day -Reviewed [name] is unlikely to be very helpful for DM given GFR < 45 (still helpful for HF/CKD) -Encouraged use of [employer] which she has supply, she declined to come into office for CGM training; discussed possibility of home visit for CGM training, pt will check with partner [name] and get back to me -Recommend to continue current medication   Heart Failure / HTN (Goal: BP < 130/80) -Controlled - pt is not using furosemide often -Last ejection fraction: 60-65% (Date: 06/2020) -HF type: Diastolic (Grade 1); NYHA Class: II (slight limitation of activity) -Follow with cardiology, overdue for appt (last 01/2020) -Current treatment: Farxiga 5 mg daily - Appropriate, Effective, Safe, Accessible Metoprolol succinate 25 mg daily -Appropriate, Effective, Safe, Accessible Furosemide 20 mg PRN  - Appropriate, Effective, Safe, Accessible Isosorbide MN 30 mg - 1/2 tab daily -Appropriate, Effective, Safe, Accessible -Medications previously tried: n/a  -Educated on Benefits of medications for managing symptoms and prolonging life -Recommended to continue current medication; advised to make appt with cardiology  Atrial Flutter (Goal: prevent stroke and major bleeding) -Controlled -CHADSVASC: 7; Aflutter occurred in s/o of acute PE/DVT 07/2019 -hx of DVT/presumed PE 07/2019, has remained on Eliquis + Plavix since then; overdue for cardiology appt (last 01/2020) -Current treatment: Metoprolol succinate 25 mg daily - Appropriate, Effective, Safe, Accessible Eliquis 5 mg BID - Query Appropriate -Medications previously tried: amiodarone -Reviewed indication for Eliquis - acute DVT/PE treatment was planned for 6 months, then reassess but patient was lost to follow up; A-Flutter was also in s/o of acute PE/DVT, unclear if this is a chronic issue; Recommend re-evaluation per cardiology or PCP to determine if Eliquis is still necessary  Hyperlipidemia/ CAD (LDL goal < 70) -Controlled - LDL 48  (10/2021) at  goal -Hx CAD, CABGx3 -Current treatment: Atorvastatin 40 mg daily - Appropriate, Effective, Safe, Accessible Clopidogrel 75 mg daily - Appropriate, Effective, Safe, Accessible Isosorbide MN 30 mg - 1/2 tab daily -Appropriate, Effective, Safe, Accessible -Medications previously tried: n/a  -Educated on Cholesterol goals;  -Recommended to continue current medication  Depression/Anxiety (Goal: Improve symptoms) -Improving  - She has had good and bad days -History of auditory hallucinations  -PHQ9:  4 (06/06/20) - minimal depression -Current treatment: Duloxetine 30 mg daily - Appropriate, Effective, Safe, Accessible -Medications previously tried/failed: sertraline 2015 (75 mg) --> changed to duloxetine 30, up to 60 mg (2017-2018) --> changed to citalopram 2018 - unknown efficacy. There is note in chart that duloxetine was not effective and thus changed to citalopram. But then later placed back on duloxetine after a psych admission along with trazodone and Geodon. -Declines interest in CBT in the past. Reports this was not effective. -Recommend to continue current medication   Chronic Kidney Disease Stage 3b  -All medications assessed for renal dosing and appropriateness in chronic kidney disease. -Recommended to continue current medication  Patient Goals/Self-Care Activities Patient will:  - take medications as prescribed - follow up with health care providers       The patient verbalized understanding of instructions, educational materials, and care plan provided today and DECLINED offer to receive copy of patient instructions, educational materials, and care plan.  Telephone follow up appointment with pharmacy team member scheduled for: TBD  Charlene Brooke, PharmD, BCACP Clinical Pharmacist [facility] [phone]

## 2021-12-25 NOTE — Progress Notes (Signed)
Chronic Care Management Pharmacy Note  12/25/21 Name:  Maria Fox MRN:  [id number] DOB:  Mar 10, 1946  Summary: CCM F/U visit -Pt endorses compliance with pill packs -DM: A1c 9.5% (10/2021), glipizide was increased to 10 mg BID last month; She is not compliant with glucose testing and has not set up [employer] (which has been sent to her home), she declines office visit for CGM training, will consider Home visit with PharmD -Aflutter/hx DVT: pt has been on Eliquis since DVT 07/2019, initially planned for 6 month duration but pt was lost to follow up and has continued. Aflutter also occurred in acute setting of DVT/PE, unclear if this is a chronic issue. Unclear if Eliquis is still needed. Pt has scheduled her overdue follow up with Cardiology (Jan 2024)   Recommendations/Changes made from today's visit: -Consider home visit for CGM training- pt will call back if interested   Plan: -[place] will call patient monthly for medication adherence -Pharmacist follow up TBD -PCP F/U 02/06/22 -Cardiology f/u 04/05/22    Subjective: Maria Fox is an 76 y.o. year old female who is a primary patient of [name], MD.  The CCM team was consulted for assistance with disease management and care coordination needs.    Engaged with patient by telephone for follow up visit in response to provider referral for pharmacy case management and/or care coordination services.   Consent to Services:  The patient was given information about Chronic Care Management services, agreed to services, and gave verbal consent prior to initiation of services.  Please see initial visit note for detailed documentation.   Patient Care Team: [name], MD as PCP - General (Family Medicine) Sueanne Margarita, MD as PCP - Cardiology (Cardiology) Charlton Haws, [facility] as Pharmacist (Pharmacist)  Recent office visits: 11/06/21 Dr [name] OV: annual - Maximize glipizide to 10 mg BID. RTC 3  months for A1c. New CKD stage 3. Schedule DEXA and mammogram.  01/26/21 Dr [name] OV: f/u DM. Pt has been noncompliant with Lantus, she is taking pills. Restart Lantus. Restart B12 500 mcg daily. GFR worsened  Recent consult visits: 07/04/21 Urgent Care - finger fracture 02/06/21 Urgent Care - vaginal abscess. Rx'd doxycycline  Hospital visits: None in previous 6 months   Objective:  Lab Results  Component Value Date   CREATININE 1.54 (H) 10/30/2021   BUN 14 10/30/2021   GFR 32.12 (L) 10/30/2021   GFRNONAA >60 08/01/2020   GFRAA 41 (L) 01/28/2020   NA 135 10/30/2021   K 4.6 10/30/2021   CALCIUM 9.0 10/30/2021   CO2 22 10/30/2021   GLUCOSE 461 (H) 10/30/2021    Lab Results  Component Value Date/Time   HGBA1C 9.5 (H) 10/30/2021 02:52 PM   HGBA1C 8.3 (A) 01/26/2021 03:50 PM   HGBA1C 11.6 (H) 07/03/2020 09:37 AM   FRUCTOSAMINE 302 (H) 11/10/2015 04:27 PM   GFR 32.12 (L) 10/30/2021 02:52 PM   GFR 37.02 (L) 05/02/2020 03:25 PM   MICROALBUR 1.7 10/30/2021 02:52 PM   MICROALBUR <0.7 05/02/2020 03:25 PM    Last diabetic Eye exam:  Lab Results  Component Value Date/Time   HMDIABEYEEXA Retinopathy (A) 03/23/2020 12:00 AM    Last diabetic Foot exam: No results found for: "HMDIABFOOTEX"   Lab Results  Component Value Date   CHOL 102 10/30/2021   HDL 34.40 (L) 10/30/2021   LDLCALC 40 05/02/2020   LDLDIRECT 48.0 10/30/2021   TRIG 280.0 (H) 10/30/2021   CHOLHDL 3 10/30/2021  Latest Ref Rng & Units 10/30/2021    2:52 PM 08/01/2020    1:01 AM 07/29/2020   11:37 AM  Hepatic Function  Total Protein 6.0 - 8.3 g/dL 6.4  5.7  7.8   Albumin 3.5 - 5.2 g/dL 3.9  2.7  3.7   AST 0 - 37 U/L '15  18  22   ' ALT 0 - 35 U/L '13  13  20   ' Alk Phosphatase 39 - 117 U/L 69  73  109   Total Bilirubin 0.2 - 1.2 mg/dL 0.5  0.4  1.2     Lab Results  Component Value Date/Time   TSH 1.187 07/15/2020 06:11 AM   TSH 1.247 07/10/2019 10:27 AM   TSH 2.64 05/02/2017 04:13 PM   TSH 3.457  07/08/2014 05:00 AM   FREET4 0.87 07/13/2014 11:18 AM       Latest Ref Rng & Units 10/30/2021    2:52 PM 08/01/2020    1:01 AM 07/31/2020    1:56 AM  CBC  WBC 4.0 - 10.5 K/uL 6.8  7.3  8.4   Hemoglobin 12.0 - 15.0 g/dL 12.9  10.5  10.4   Hematocrit 36.0 - 46.0 % 39.4  31.6  30.9   Platelets 150.0 - 400.0 K/uL 231.0  265  235     Lab Results  Component Value Date/Time   VD25OH 29.11 (L) 10/30/2021 02:52 PM   VD25OH 32.36 05/02/2020 03:25 PM    Clinical ASCVD: Yes  The ASCVD Risk score (Arnett DK, et al., 2019) failed to calculate for the following reasons:   The patient has a prior MI or stroke diagnosis    CHA2DS2/VAS Stroke Risk Points  Current as of 2 days ago (Monday)     7 >= 2 Points: High Risk  1 - 1.99 Points: Medium Risk  0 Points: Low Risk    Last Change: N/A       Points Metrics  1 Has Congestive Heart Failure:  Yes    Current as of 2 days ago (Monday)  1 Has Vascular Disease:  Yes    Current as of 2 days ago (Monday)  1 Has Hypertension:  Yes    Current as of 2 days ago (Monday)  2 Age:  76    Current as of 2 days ago (Monday)  1 Has Diabetes:  Yes    Current as of 2 days ago (Monday)  0 Had Stroke:  No  Had TIA:  No  Had Thromboembolism:  No    Current as of 2 days ago (Monday)  1 Female:  Yes    Current as of 2 days ago (Monday)       10/25/2021   11:16 AM 06/06/2020    3:38 PM 05/02/2020    4:55 PM  Depression screen PHQ 2/9  Decreased Interest '3 2 2  ' Down, Depressed, Hopeless '2 2 2  ' PHQ - 2 Score '5 4 4  ' Altered sleeping 0 0 0  Tired, decreased energy 0 0 2  Change in appetite 0 0 2  Feeling bad or failure about yourself  0 0 2  Trouble concentrating 0 0 1  Moving slowly or fidgety/restless 0 0 1  Suicidal thoughts 0 0 0  PHQ-9 Score '5 4 12  ' Difficult doing work/chores Somewhat difficult Not difficult at all Very difficult     Social History   Tobacco Use  Smoking Status Never  Smokeless Tobacco Never   BP Readings from Last 3  Encounters:  11/06/21 128/84  07/04/21 (!) 157/83  02/06/21 131/81   Pulse Readings from Last 3 Encounters:  11/06/21 88  07/04/21 73  02/06/21 94   Wt Readings from Last 3 Encounters:  11/06/21 138 lb 8 oz (62.8 kg)  01/26/21 137 lb 7 oz (62.3 kg)  09/08/20 140 lb 3.4 oz (63.6 kg)   BMI Readings from Last 3 Encounters:  11/06/21 24.93 kg/m  01/26/21 24.35 kg/m  09/08/20 24.84 kg/m    Assessment/Interventions: Review of patient past medical history, allergies, medications, health status, including review of consultants reports, laboratory and other test data, was performed as part of comprehensive evaluation and provision of chronic care management services.   SDOH:  (Social Determinants of Health) assessments and interventions performed: No - done 10/2021 SDOH Interventions    Flowsheet Row Clinical Support from 10/25/2021 in [place] at [place] [facility] from 06/14/2021 in [place] at [place] from 06/06/2020 in [place] at [place] [facility] from 05/02/2020 in [place] at [place] Management from 06/21/2019 in [place] at [facility] Visit from 12/11/2018 in [place] at [place] Interventions Intervention Not Indicated Intervention Not Indicated -- -- -- --  Housing Interventions Intervention Not Indicated -- -- -- -- --  Transportation Interventions Intervention Not Indicated -- -- -- -- --  Depression Interventions/Treatment  Currently on Treatment -- PHQ2-9 Score <4 Follow-up Not Indicated Medication -- Medication  Financial Strain Interventions Intervention Not Indicated Intervention Not Indicated -- Intervention Not Indicated  [Medications affordable] --  [Applying for low income subsidy] --  Physical Activity Interventions Intervention Not Indicated -- -- -- -- --  Stress Interventions  Intervention Not Indicated -- -- -- -- --  Social Connections Interventions Intervention Not Indicated -- -- -- -- --      SDOH Screenings   Food Insecurity: No Food Insecurity (10/25/2021)  Housing: Low Risk  (10/25/2021)  Transportation Needs: No Transportation Needs (10/25/2021)  Alcohol Screen: Low Risk  (10/25/2021)  Depression (PHQ2-9): Medium Risk (10/25/2021)  Financial Resource Strain: Low Risk  (10/25/2021)  Physical Activity: Inactive (10/25/2021)  Social Connections: Unknown (10/25/2021)  Stress: No Stress Concern Present (10/25/2021)  Tobacco Use: Low Risk  (12/04/2021)    CCM Care Plan  Allergies  Allergen Reactions   Bee Venom     Throat swelling   Mushroom Extract Complex Anaphylaxis   Penicillins Anaphylaxis, Hives and Swelling    Tolerates cephalosporins including cephalexin multiple times.  Has patient had a PCN reaction causing immediate rash, facial/tongue/throat swelling, SOB or lightheadedness with hypotension: Yes Has patient had a PCN reaction causing severe rash involving mucus membranes or skin necrosis: Yes Has patient had a PCN reaction that required hospitalization Yes Has patient had a PCN reaction occurring within the last 10 years: No     Codeine Nausea And Vomiting   Sulfa Antibiotics Nausea And Vomiting   Iohexol Itching and Swelling   Erythromycin Base Rash    Medications Reviewed Today     Reviewed by Charlton Haws, [facility] (Pharmacist) on 12/25/21 at 1520  Med List Status: <None>   Medication Order Taking? Sig Documenting Provider Last Dose Status Informant  acetaminophen (TYLENOL) 500 MG tablet [id number] Yes Take 500 mg by mouth as needed for moderate pain. [provider] Taking Active   albuterol [facility] HFA) [phone] Base) MCG/ACT inhaler [id number] Yes Inhale  2 puffs into the lungs every 6 (six) hours as needed for wheezing or shortness of breath. [name], MD Taking Active   Aloe Vera 72 % CREA [id number] Yes Apply to  burned areas four times daily. Maudie Flakes, MD Taking Active   atorvastatin (LIPITOR) 40 MG tablet [id number] Yes TAKE ONE TABLET BY MOUTH EVERY Recardo Evangelist, MD Taking Active   Blood Glucose Monitoring Suppl (ONE TOUCH ULTRA 2) w/Device KIT [id number] Yes Use as instructed to check blood sugar 2 times daily as needed. [name], MD Taking Active   clopidogrel (PLAVIX) 75 MG tablet [id number] Yes Take 1 tablet (75 mg total) by mouth daily. Sueanne Margarita, MD Taking Active   dapagliflozin propanediol (FARXIGA) 5 MG TABS tablet [id number] Yes TAKE ONE TABLET BY MOUTH EVERY MORNING [name], MD Taking Active   docusate sodium (COLACE) 100 MG capsule [id number] Yes Take 1 capsule (100 mg total) by mouth daily.  Patient taking differently: Take 100 mg by mouth daily as needed for mild constipation.   [name], MD Taking Active            Med Note Sarajane Jews, CALLIE   Sat Jul 01, 2020  1:47 PM) Dewaine Conger as needed  doxycycline (VIBRAMYCIN) 100 MG capsule [id number] Yes Take 1 capsule (100 mg total) by mouth 2 (two) times daily. RaspetDerry Skill, PA-C Taking Active   DULoxetine (CYMBALTA) 30 MG capsule [id number] Yes TAKE ONE CAPSULE BY MOUTH EVERY MORNING [name], MD Taking Active   ELIQUIS 5 MG TABS tablet [id number] Yes TAKE ONE TABLET BY MOUTH TWICE DAILY [name], MD Taking Active   feeding supplement, GLUCERNA SHAKE, ([place]) LIQD [id number] Yes Take 237 mLs by mouth 3 (three) times daily between meals. Mariel Aloe, MD Taking Active   furosemide (LASIX) 20 MG tablet [id number] Yes Take 1 tablet (20 mg total) by mouth daily as needed for fluid or edema (leg swelling). Arrien, Jimmy Picket, MD Taking Active Self  gabapentin (NEURONTIN) 300 MG capsule [id number] Yes TAKE ONE CAPSULE BY MOUTH TWICE DAILY [name], MD Taking Active   glipiZIDE (GLUCOTROL) 10 MG tablet [id number] Yes Take 1 tablet (10 mg total) by mouth 2 (two) times daily.  [name], MD Taking Active   glucose blood [facility] ULTRA) test strip [id number] Yes Use as instructed to check blood sugar 2 times daily as needed [name], MD Taking Active   isosorbide mononitrate (IMDUR) 30 MG 24 hr tablet [id number] Yes TAKE 1/2 TABLET BY MOUTH ONCE DAILY . Sueanne Margarita, MD Taking Active   latanoprost (XALATAN) 0.005 % ophthalmic solution [id number] Yes Place 1 drop into both eyes at bedtime.  [provider] Taking Active Self  metFORMIN (GLUCOPHAGE-XR) 500 MG 24 hr tablet [id number] Yes TAKE TWO TABLETS BY MOUTH EVERY MORNING [name], MD Taking Active   metoprolol succinate (TOPROL-XL) 25 MG 24 hr tablet [id number] Yes TAKE ONE TABLET BY MOUTH ONCE DAILY [name], MD Taking Active   nitroGLYCERIN (NITROSTAT) 0.4 MG SL tablet [id number] Yes Dissolve 1 tab under tongue as needed for chest pain. May repeat every 5 minutes x 2 doses. If no relief call 9-1-1. Sueanne Margarita, MD Taking Active   ondansetron (ZOFRAN-ODT) 4 MG disintegrating tablet [id number] Yes TAKE ONE TABLET BY MOUTH every EIGHT hours AS NEEDED FOR NAUSEA AND VOMITING [name], MD Taking Active   OneTouch Delica Lancets 80D MISC [id number] Yes Use as directed to check blood sugar 2 times daily as  needed [name], MD Taking Active   pantoprazole (PROTONIX) 40 MG tablet [id number] Yes TAKE ONE TABLET BY MOUTH TWICE DAILY [name], MD Taking Active   polyethylene glycol (MIRALAX / GLYCOLAX) 17 g packet [id number] Yes Take 17 g by mouth daily as needed for moderate constipation. Arrien, Jimmy Picket, MD Taking Active Self            Patient Active Problem List   Diagnosis Date Noted   Memory deficit 11/07/2021   Functional abdominal pain syndrome 07/29/2020   Atypical chest pain 07/04/2020   Low-tension glaucoma, bilateral, severe stage 03/24/2020   Dysuria 11/08/2019   Right sided temporal headache 08/06/2019   Atrial flutter with rapid  ventricular response ([place])    Acute deep vein thrombosis (DVT) of left tibial vein ([place]) 07/11/2019   Acute cholecystitis 07/07/2019   Pain of right breast 03/15/2019   Health maintenance examination 12/11/2018   Type 2 diabetes mellitus with severe nonproliferative diabetic retinopathy with macular edema, bilateral ([place]) 07/16/2018   Coronary artery disease of native artery of native heart with stable angina pectoris ([name])    Effort angina 11/12/2017   Abnormal stress ECG    Dyspnea on exertion 11/04/2017   GERD (gastroesophageal reflux disease) 08/15/2017   Trigger finger 06/09/2017   Nocturnal leg movements 03/25/2017   Osteopenia 02/01/2017   Advanced care planning/counseling discussion 01/02/2017   Stage 3 chronic kidney disease due to type 2 diabetes mellitus ([place]) 08/26/2016   Insomnia 08/26/2016   Vitamin B12 deficiency 11/10/2015   Epistaxis 07/07/2015   Fatty liver disease, nonalcoholic 79/48/0165   Chronic diastolic CHF (congestive heart failure) ([place]) 08/27/2014   Positive hepatitis C antibody test 08/27/2014   Iron deficiency 08/27/2014   NSVT (nonsustained ventricular tachycardia) ([name])    Nausea without vomiting    Autonomic orthostatic hypotension 07/11/2014   Lower urinary tract infectious disease 07/08/2014   S/P CABG x 3 07/08/2014   History of cerebrovascular accident (CVA) in adulthood 07/08/2014   Hypokalemia 03/23/2014   Type 2 diabetes mellitus with neurological complications ([place]) 53/74/8270   Itching 03/16/2012   Rash 03/16/2012   Medicare annual wellness visit, initial 10/28/2011   Hyperlipidemia associated with type 2 diabetes mellitus ([place])    Idiopathic angioedema 06/23/2011   Vitiligo 06/23/2011   Dermatitis 06/23/2011   MDD (major depressive disorder), recurrent severe, without psychosis ([place]) 05/08/2006   Hypertension, essential 05/08/2006   MENOPAUSAL SYNDROME 05/08/2006    Immunization History  Administered Date(s) Administered   Fluad  Quad(high Dose 65+) 12/11/2018, 01/26/2021   Influenza Split 12/17/2011   Influenza Whole 01/22/2013   Influenza,inj,Quad PF,6+ Mos 11/10/2015, 12/26/2016, 11/26/2017   Moderna Sars-Covid-2 Vaccination 03/21/2020   Pneumococcal Conjugate-13 12/26/2016   Pneumococcal-Unspecified 11/30/2010   Td 08/10/2003    Conditions to be addressed/monitored:  Hypertension, Hyperlipidemia, Diabetes, Atrial Fibrillation, Heart Failure, Coronary Artery Disease, and Depression  Care Plan : [place]  Updates made by Charlton Haws, [place] since 12/25/2021 12:00 AM     Problem: Hypertension, Hyperlipidemia, Diabetes, Atrial Fibrillation, Heart Failure, Coronary Artery Disease, and Depression   Priority: High     Long-Range Goal: Disease Management   Start Date: 05/03/2020  Expected End Date: 06/19/2022  This Visit's Progress: On track  Recent Progress: On track  Priority: High  Note:   Current Barriers:  Unable to achieve control of diabetes    Pharmacist Clinical Goal(s):  Patient will achieve adherence to monitoring guidelines and medication adherence to achieve therapeutic efficacy through  collaboration with PharmD and provider.    Interventions: 1:1 collaboration with [name], MD regarding development and update of comprehensive plan of care as evidenced by provider attestation and co-signature Inter-disciplinary care team collaboration (see longitudinal plan of care) Comprehensive medication review performed; medication list updated in electronic medical record   Diabetes (A1c goal <7%) -Uncontrolled - A1c 9.5% (10/2021) worsening; pt does not check BG with glucometer and did not set up [employer]; she states "I'm never going to take insulin"; She does affirm taking her oral medications as prescribed. -Current home glucose readings - n/a (misplaced meter, no motivation to check BG) -She denies s/sx of hypoglycemia or hyperglycemia -Current  medications: Farxiga 5 mg daily - Appropriate, Query Effective Metformin 500 mg ER - 2 tab AM- Appropriate, Query Effective Glipizide 10 mg BID- Appropriate, Query Effective Freestyle Libre - not using -Medications previously tried: Ozempic (never started; hx of N/V hesitant to use GLP1-RA), Lantus (never started - refuses insulin) -Current meal patterns: poor appetite, only eating once/day -Reviewed [name] is unlikely to be very helpful for DM given GFR < 45 (still helpful for HF/CKD) -Encouraged use of [employer] which she has supply, she declined to come into office for CGM training; discussed possibility of home visit for CGM training, pt will check with partner [name] and get back to me -Recommend to continue current medication   Heart Failure / HTN (Goal: BP < 130/80) -Controlled - pt is not using furosemide often -Last ejection fraction: 60-65% (Date: 06/2020) -HF type: Diastolic (Grade 1); NYHA Class: II (slight limitation of activity) -Follow with cardiology, overdue for appt (last 01/2020) -Current treatment: Farxiga 5 mg daily - Appropriate, Effective, Safe, Accessible Metoprolol succinate 25 mg daily -Appropriate, Effective, Safe, Accessible Furosemide 20 mg PRN  - Appropriate, Effective, Safe, Accessible Isosorbide MN 30 mg - 1/2 tab daily -Appropriate, Effective, Safe, Accessible -Medications previously tried: n/a  -Educated on Benefits of medications for managing symptoms and prolonging life -Recommended to continue current medication; advised to make appt with cardiology  Atrial Flutter (Goal: prevent stroke and major bleeding) -Controlled -CHADSVASC: 7; Aflutter occurred in s/o of acute PE/DVT 07/2019 -hx of DVT/presumed PE 07/2019, has remained on Eliquis + Plavix since then; overdue for cardiology appt (last 01/2020) -Current treatment: Metoprolol succinate 25 mg daily - Appropriate, Effective, Safe, Accessible Eliquis 5 mg BID - Query Appropriate -Medications  previously tried: amiodarone -Reviewed indication for Eliquis - acute DVT/PE treatment was planned for 6 months, then reassess but patient was lost to follow up; A-Flutter was also in s/o of acute PE/DVT, unclear if this is a chronic issue; Recommend re-evaluation per cardiology or PCP to determine if Eliquis is still necessary  Hyperlipidemia/ CAD (LDL goal < 70) -Controlled - LDL 48 (10/2021) at goal -Hx CAD, CABGx3 -Current treatment: Atorvastatin 40 mg daily - Appropriate, Effective, Safe, Accessible Clopidogrel 75 mg daily - Appropriate, Effective, Safe, Accessible Isosorbide MN 30 mg - 1/2 tab daily -Appropriate, Effective, Safe, Accessible -Medications previously tried: n/a  -Educated on Cholesterol goals;  -Recommended to continue current medication  Depression/Anxiety (Goal: Improve symptoms) -Improving  - She has had good and bad days -History of auditory hallucinations  -PHQ9:  4 (06/06/20) - minimal depression -Current treatment: Duloxetine 30 mg daily - Appropriate, Effective, Safe, Accessible -Medications previously tried/failed: sertraline 2015 (75 mg) --> changed to duloxetine 30, up to 60 mg (2017-2018) --> changed to citalopram 2018 - unknown efficacy. There is note in chart that duloxetine was not effective and thus  changed to citalopram. But then later placed back on duloxetine after a psych admission along with trazodone and Geodon. -Declines interest in CBT in the past. Reports this was not effective. -Recommend to continue current medication   Chronic Kidney Disease Stage 3b  -All medications assessed for renal dosing and appropriateness in chronic kidney disease. -Recommended to continue current medication  Patient Goals/Self-Care Activities Patient will:  - take medications as prescribed - follow up with health care providers      Medication Assistance: None required.  Patient affirms current coverage meets needs. - LIS  Compliance/Adherence/Medication fill  history: Care Gaps: Mammogram (due 03/2020) Eye exam (due 03/2021) Urine microalbumin (due 04/2021)  Star-Rating Drugs: Atorvastatin - PDC 100% Glipizide - PDC 100% Metformin - PDC 100% Farxiga - PDC 100%  Medication Access: Within the past 30 days, how often has patient missed a dose of medication? 0 Is a pillbox or other method used to improve adherence? Yes  Factors that may affect medication adherence? no barriers identified Are meds synced by current pharmacy? Yes  Are meds delivered by current pharmacy? Yes  Does patient experience delays in picking up medications due to transportation concerns? No   [facility] Reviewed: Is patient disadvantaged to use [facility]?: No  Current Rx insurance plan: [facility] Name and location of Current pharmacy:  [facility] - [place], [place] - [place] [address] [place] [address] Phone: [phone] Fax: [phone]  [id number] [place] [id number]: [phone]: [phone] [facility] services reviewed with patient today?: Yes  30-day Packs: (delivery 12/27/21) FWilder Glade5 mg - 1 tablet daily (1 breakfast) Pantoprazole 40 mg - 1 tablet twice daily (1 breakfast, 1 evening meal) Gabapentin 300 mg- 1 tablet twice daily (1 breakfast, 1 evening meal) Duloxetine 30 mg - 1 capsule daily (1 breakfast) Atorvastatin 40 mg - 1 tablet daily (1 evening meal) Isosorbide Mononitrate 30 mg - 1/2 tablet daily (1/2 breakfast) Glipizide 10 mg - 1 tablet twice daily (1 breakfast, 1 evening meal) Metformin 500 mg ER - 2 tablets ( breakfast) Eliquis 5 mg- 1 tablet twice daily (1 breakfast, 1 evening meal) Metoprolol succinate 25 mg ER - 1 tablet daily  (breakfast) Clopidogrel 729m- Take 1 tablet daily ( breakfast)   VIAL medications: Latanoprost 0.005 % eye drops -  1 drop in each eye daily Ondansetron 20m22mtake 1 tablet every 8 hours  Nitroglycerin  0.20mg47make as needed  Care Plan and Follow Up Patient Decision:  Patient agrees to Care Plan and Follow-up.  Plan: Telephone follow up appointment with care management team member scheduled for:  1 months  LindCharlene BrookearmD, BCACP Clinical Pharmacist [facility][phone]

## 2021-12-27 ENCOUNTER — Other Ambulatory Visit: Payer: Self-pay

## 2021-12-27 NOTE — Telephone Encounter (Addendum)
Pt calling stating she has some fever blisters and need new rx for Zovirax (not on current med list).  Says Dr. [name] prescribed it for her yrs ago.  Offered OV, but pt declined, stating she will just try something else.

## 2021-12-28 MED ORDER — ACYCLOVIR 5 % EX OINT: TOPICAL_OINTMENT | Freq: Every day | CUTANEOUS | 1 refills | Status: DC

## 2021-12-28 NOTE — Telephone Encounter (Signed)
ERx 

## 2022-01-02 DIAGNOSIS — H26493 Other secondary cataract, bilateral: Secondary | ICD-10-CM | POA: Diagnosis not present

## 2022-01-02 DIAGNOSIS — E113293 Type 2 diabetes mellitus with mild nonproliferative diabetic retinopathy without macular edema, bilateral: Secondary | ICD-10-CM | POA: Diagnosis not present

## 2022-01-02 DIAGNOSIS — H401131 Primary open-angle glaucoma, bilateral, mild stage: Secondary | ICD-10-CM | POA: Diagnosis not present

## 2022-01-02 DIAGNOSIS — H00021 Hordeolum internum right upper eyelid: Secondary | ICD-10-CM | POA: Diagnosis not present

## 2022-01-02 DIAGNOSIS — H35372 Puckering of macula, left eye: Secondary | ICD-10-CM | POA: Diagnosis not present

## 2022-01-02 DIAGNOSIS — Z961 Presence of intraocular lens: Secondary | ICD-10-CM | POA: Diagnosis not present

## 2022-01-02 LAB — HM DIABETES EYE EXAM

## 2022-01-04 ENCOUNTER — Emergency Department (HOSPITAL_COMMUNITY)
Admission: EM | Admit: 2022-01-04 | Discharge: 2022-01-04 | Disposition: A | Discharge status: Home / Self Care | Payer: Medicare Other | Attending: Emergency Medicine | Admitting: Emergency Medicine

## 2022-01-04 ENCOUNTER — Encounter: Payer: Self-pay | Admitting: Family Medicine

## 2022-01-04 ENCOUNTER — Encounter (HOSPITAL_COMMUNITY): Payer: Self-pay

## 2022-01-04 DIAGNOSIS — K047 Periapical abscess without sinus: Secondary | ICD-10-CM | POA: Diagnosis not present

## 2022-01-04 DIAGNOSIS — H401190 Primary open-angle glaucoma, unspecified eye, stage unspecified: Secondary | ICD-10-CM | POA: Insufficient documentation

## 2022-01-04 DIAGNOSIS — Z7901 Long term (current) use of anticoagulants: Secondary | ICD-10-CM | POA: Insufficient documentation

## 2022-01-04 DIAGNOSIS — R220 Localized swelling, mass and lump, head: Secondary | ICD-10-CM | POA: Diagnosis not present

## 2022-01-04 LAB — CBC WITH DIFFERENTIAL/PLATELET
Abs Immature Granulocytes: 0.04 10*3/uL (ref 0.00–0.07)
Basophils Absolute: 0.1 10*3/uL (ref 0.0–0.1)
Basophils Relative: 0 %
Eosinophils Absolute: 0 10*3/uL (ref 0.0–0.5)
Eosinophils Relative: 0 %
HCT: 42 % (ref 36.0–46.0)
Hemoglobin: 14 g/dL (ref 12.0–15.0)
Immature Granulocytes: 0 %
Lymphocytes Relative: 12 %
Lymphs Abs: 1.6 10*3/uL (ref 0.7–4.0)
MCH: 30.2 pg (ref 26.0–34.0)
MCHC: 33.3 g/dL (ref 30.0–36.0)
MCV: 90.7 fL (ref 80.0–100.0)
Monocytes Absolute: 0.7 10*3/uL (ref 0.1–1.0)
Monocytes Relative: 5 %
Neutro Abs: 11.4 10*3/uL — ABNORMAL HIGH (ref 1.7–7.7)
Neutrophils Relative %: 83 %
Platelets: 260 10*3/uL (ref 150–400)
RBC: 4.63 MIL/uL (ref 3.87–5.11)
RDW: 13.1 % (ref 11.5–15.5)
WBC: 13.9 10*3/uL — ABNORMAL HIGH (ref 4.0–10.5)
nRBC: 0 % (ref 0.0–0.2)

## 2022-01-04 LAB — COMPREHENSIVE METABOLIC PANEL
ALT: 13 U/L (ref 0–44)
AST: 19 U/L (ref 15–41)
Albumin: 3.8 g/dL (ref 3.5–5.0)
Alkaline Phosphatase: 67 U/L (ref 38–126)
Anion gap: 11 (ref 5–15)
BUN: 21 mg/dL (ref 8–23)
CO2: 21 mmol/L — ABNORMAL LOW (ref 22–32)
Calcium: 9.3 mg/dL (ref 8.9–10.3)
Chloride: 103 mmol/L (ref 98–111)
Creatinine, Ser: 1.28 mg/dL — ABNORMAL HIGH (ref 0.44–1.00)
GFR, Estimated: 43 mL/min — ABNORMAL LOW (ref >60)
Glucose, Bld: 209 mg/dL — ABNORMAL HIGH (ref 70–99)
Potassium: 4.1 mmol/L (ref 3.5–5.1)
Sodium: 135 mmol/L (ref 135–145)
Total Bilirubin: 1.4 mg/dL — ABNORMAL HIGH (ref 0.3–1.2)
Total Protein: 7.6 g/dL (ref 6.5–8.1)

## 2022-01-04 MED ORDER — ONDANSETRON 4 MG PO TBDP
ORAL_TABLET | ORAL | Status: AC
  Filled 2022-01-04: qty 1

## 2022-01-04 MED ORDER — ACETAMINOPHEN 325 MG PO TABS
650.0000 mg | ORAL_TABLET | Freq: Once | ORAL | Status: AC
  Administered 2022-01-04: 650 mg via ORAL
  Filled 2022-01-04: qty 2

## 2022-01-04 MED ORDER — ONDANSETRON 4 MG PO TBDP
4.0000 mg | ORAL_TABLET | Freq: Once | ORAL | Status: AC
  Administered 2022-01-04: 4 mg via ORAL

## 2022-01-04 MED ORDER — CLINDAMYCIN HCL 150 MG PO CAPS: 150.0000 mg | ORAL_CAPSULE | Freq: Four times a day (QID) | ORAL | 0 refills | Status: DC

## 2022-01-04 MED ORDER — ONDANSETRON 8 MG PO TBDP: 8.0000 mg | ORAL_TABLET | Freq: Three times a day (TID) | ORAL | 0 refills | Status: DC | PRN

## 2022-01-04 NOTE — ED Provider Notes (Signed)
76 year old female presents with 1 day history of swelling [facility] DEPT Provider Note   CSN: [id number] Arrival date & time: 01/04/22  1207     History  Chief Complaint  Patient presents with   Facial Swelling    Maria Fox is a 76 y.o. female.  76 year old female who presents with 1 day history of swelling to left side of her face.  Patient denies any trouble swallowing.  No fever or chills.  Denies any tooth pain at this time.  States that the swelling is at her left lower mandible and has been gradually improving without treatment.  No prior history of same.  No treatment use prior to arrival       Home Medications Prior to Admission medications   Medication Sig Start Date End Date Taking? Authorizing Provider  acetaminophen (TYLENOL) 500 MG tablet Take 500 mg by mouth as needed for moderate pain.    [provider]  acyclovir ointment (ZOVIRAX) 5 % Apply topically 5 (five) times daily. For 4 days 12/28/21   [name], MD  albuterol [facility] HFA) [phone] Base) MCG/ACT inhaler Inhale 2 puffs into the lungs every 6 (six) hours as needed for wheezing or shortness of breath. 05/10/20   [name], MD  Aloe Vera 72 % CREA Apply to burned areas four times daily. 09/09/20   Maudie Flakes, MD  atorvastatin (LIPITOR) 40 MG tablet TAKE ONE TABLET BY MOUTH EVERY EVENING 10/17/21   [name], MD  Blood Glucose Monitoring Suppl (ONE TOUCH ULTRA 2) w/Device KIT Use as instructed to check blood sugar 2 times daily as needed. 04/10/20   [name], MD  clopidogrel (PLAVIX) 75 MG tablet Take 1 tablet (75 mg total) by mouth daily. 12/14/21   Sueanne Margarita, MD  dapagliflozin propanediol (FARXIGA) 5 MG TABS tablet TAKE ONE TABLET BY MOUTH EVERY MORNING 11/16/21   [name], MD  docusate sodium (COLACE) 100 MG capsule Take 1 capsule (100 mg total) by mouth daily. Patient taking differently: Take 100 mg by mouth daily as  needed for mild constipation. 07/16/18   [name], MD  doxycycline (VIBRAMYCIN) 100 MG capsule Take 1 capsule (100 mg total) by mouth 2 (two) times daily. 02/06/21   Raspet, Derry Skill, PA-C  DULoxetine (CYMBALTA) 30 MG capsule TAKE ONE CAPSULE BY MOUTH EVERY MORNING 12/14/21   [name], MD  ELIQUIS 5 MG TABS tablet TAKE ONE TABLET BY MOUTH TWICE DAILY 12/14/21   [name], MD  feeding supplement, GLUCERNA SHAKE, (GLUCERNA SHAKE) LIQD Take 237 mLs by mouth 3 (three) times daily between meals. 08/05/20   Mariel Aloe, MD  furosemide (LASIX) 20 MG tablet Take 1 tablet (20 mg total) by mouth daily as needed for fluid or edema (leg swelling). 07/27/20   Arrien, Jimmy Picket, MD  gabapentin (NEURONTIN) 300 MG capsule TAKE ONE CAPSULE BY MOUTH TWICE DAILY 08/17/21   [name], MD  glipiZIDE (GLUCOTROL) 10 MG tablet Take 1 tablet (10 mg total) by mouth 2 (two) times daily. 11/06/21   [name], MD  glucose blood [facility] ULTRA) test strip Use as instructed to check blood sugar 2 times daily as needed 08/10/19   [name], MD  isosorbide mononitrate (IMDUR) 30 MG 24 hr tablet TAKE 1/2 TABLET BY MOUTH ONCE DAILY . 12/14/21   Turner, Eber Hong, MD  latanoprost (XALATAN) 0.005 % ophthalmic solution Place 1 drop into both eyes at bedtime.  07/14/18   [provider]  metFORMIN (GLUCOPHAGE-XR) 500 MG 24 hr tablet TAKE TWO TABLETS BY MOUTH EVERY MORNING 12/14/21   [name], MD  metoprolol succinate (TOPROL-XL) 25 MG 24 hr tablet TAKE ONE TABLET BY MOUTH ONCE DAILY 11/16/21   [name], MD  nitroGLYCERIN (NITROSTAT) 0.4 MG SL tablet Dissolve 1 tab under tongue as needed for chest pain. May repeat every 5 minutes x 2 doses. If no relief call 9-1-1. 12/14/21   Sueanne Margarita, MD  ondansetron (ZOFRAN-ODT) 4 MG disintegrating tablet TAKE ONE TABLET BY MOUTH every EIGHT hours AS NEEDED FOR NAUSEA AND VOMITING 12/15/21   [name], MD  OneTouch Delica  Lancets 44W MISC Use as directed to check blood sugar 2 times daily as needed 08/10/19   [name], MD  pantoprazole (PROTONIX) 40 MG tablet TAKE ONE TABLET BY MOUTH TWICE DAILY 11/16/21   [name], MD  polyethylene glycol (MIRALAX / GLYCOLAX) 17 g packet Take 17 g by mouth daily as needed for moderate constipation. 07/27/20   Arrien, Jimmy Picket, MD      Allergies    Bee venom, Mushroom extract complex, Penicillins, Codeine, Sulfa antibiotics, Iohexol, and Erythromycin base    Review of Systems   Review of Systems  All other systems reviewed and are negative.   Physical Exam Updated Vital Signs BP (!) 176/106 (BP Location: Right Arm)   Pulse (!) 106   Temp 98 F (36.7 C) (Oral)   Resp 18   SpO2 96%  Physical Exam Vitals and nursing note reviewed.  Constitutional:      General: She is not in acute distress.    Appearance: Normal appearance. She is well-developed. She is not toxic-appearing.  HENT:     Head: Normocephalic and atraumatic.     Mouth/Throat:     Comments: Caries noted.  No obvious abscess noted intraorally. Eyes:     General: Lids are normal.     Conjunctiva/sclera: Conjunctivae normal.     Pupils: Pupils are equal, round, and reactive to light.  Neck:     Thyroid: No thyroid mass.     Trachea: No tracheal deviation.  Cardiovascular:     Rate and Rhythm: Normal rate and regular rhythm.     Heart sounds: Normal heart sounds. No murmur heard.    No gallop.  Pulmonary:     Effort: Pulmonary effort is normal. No respiratory distress.     Breath sounds: Normal breath sounds. No stridor. No decreased breath sounds, wheezing, rhonchi or rales.  Abdominal:     General: There is no distension.     Palpations: Abdomen is soft.     Tenderness: There is no abdominal tenderness. There is no rebound.  Musculoskeletal:        General: No tenderness. Normal range of motion.     Cervical back: Normal range of motion and neck supple.  Skin:    General:  Skin is warm and dry.     Findings: No abrasion or rash.  Neurological:     Mental Status: She is alert and oriented to person, place, and time. Mental status is at baseline.     GCS: GCS eye subscore is 4. GCS verbal subscore is 5. GCS motor subscore is 6.     Cranial Nerves: No cranial nerve deficit.     Sensory: No sensory deficit.     Motor: Motor function is intact.  Psychiatric:        Attention and Perception: Attention normal.  Speech: Speech normal.        Behavior: Behavior normal.     ED Results / Procedures / Treatments   Labs (all labs ordered are listed, but only abnormal results are displayed) Labs Reviewed  COMPREHENSIVE METABOLIC PANEL - Abnormal; Notable for the following components:      Result Value   CO2 21 (*)    Glucose, Bld 209 (*)    Creatinine, Ser 1.28 (*)    Total Bilirubin 1.4 (*)    GFR, Estimated 43 (*)    All other components within normal limits  CBC WITH DIFFERENTIAL/PLATELET - Abnormal; Notable for the following components:   WBC 13.9 (*)    Neutro Abs 11.4 (*)    All other components within normal limits    EKG None  Radiology No results found.  Procedures Procedures    Medications Ordered in ED Medications  acetaminophen (TYLENOL) tablet 650 mg (650 mg Oral Given 01/04/22 1250)  ondansetron (ZOFRAN-ODT) disintegrating tablet 4 mg (4 mg Oral Given 01/04/22 1250)    ED Course/ Medical Decision Making/ A&P                           Medical Decision Making Risk Prescription drug management.   Patient with likely odontogenic infection.  No evidence of [name] angina.  Laboratory studies reviewed and are without significant abnormality.  Will place on antibiotics and return precautions given.  Patient given Zofran for nausea here.  No emesis noted.        Final Clinical Impression(s) / ED Diagnoses Final diagnoses:  None    Rx / DC Orders ED Discharge Orders     None         Lacretia Leigh,  MD 01/04/22 1851

## 2022-01-04 NOTE — ED Triage Notes (Signed)
Pt arrived via POV, c/o left sided facial swelling and nausea, painful to touch.

## 2022-01-04 NOTE — ED Provider Triage Note (Signed)
Emergency Medicine Provider Triage Evaluation Note  KAMALA KOLTON , a 76 y.o. female  was evaluated in triage.  Pt complains of left-sided facial swelling.  Patient noticed area felt like cold sore on the inside of her mouth this past Friday.  She was prescribed ointment by her PCP for said cold sore.  She noticed swelling in her throat over the past 2 to 3 days with increasing swelling today.  She states that area is painful.  Denies any dental pain.  Denies fever, difficulty swallowing/breathing, pruritus.  Review of Systems  Positive: See abov Negative:   Physical Exam  BP (!) 176/106 (BP Location: Right Arm)   Pulse (!) 106   Temp 98 F (36.7 C) (Oral)   Resp 18   SpO2 96%  Gen:   Awake, no distress   Resp:  Normal effort  MSK:   Moves extremities without difficulty  Other:  No dental tenderness to palpation.  No obvious breaks in skin.  Swelling noted left side of face  Medical Decision Making  Medically screening exam initiated at 12:36 PM.  Appropriate orders placed.  [name] was informed that the remainder of the evaluation will be completed by another provider, this initial triage assessment does not replace that evaluation, and the importance of remaining in the ED until their evaluation is complete.     Wilnette Kales, [place] 01/04/22 1238

## 2022-01-05 ENCOUNTER — Telehealth (HOSPITAL_COMMUNITY): Payer: Self-pay | Admitting: Emergency Medicine

## 2022-01-05 MED ORDER — CLINDAMYCIN HCL 300 MG PO CAPS: 300.0000 mg | ORAL_CAPSULE | Freq: Three times a day (TID) | ORAL | 0 refills | Status: DC

## 2022-01-05 NOTE — Telephone Encounter (Signed)
Patient request redirect meds to [facility] on [address]

## 2022-01-07 ENCOUNTER — Telehealth: Payer: Self-pay | Admitting: *Deleted

## 2022-01-07 NOTE — Patient Outreach (Signed)
  Care Coordination   Initial Visit Note   01/07/2022 Name: Maria Fox MRN: [id number] DOB: 20-Apr-1945  Maria Fox is a 76 y.o. year old female who sees [name], MD for primary care. I spoke with  Maria Fox by phone today.  What matters to the patients health and wellness today?  I am on my way to the Dr so you will have to call back at another time.   Follow up plan: Follow up call scheduled for [facility] Maria Fox 11:30 2 PM    Encounter Outcome:  Pt. Request to Call [facility] [phone]

## 2022-01-09 ENCOUNTER — Telehealth: Payer: Self-pay

## 2022-01-09 ENCOUNTER — Encounter: Payer: Self-pay | Admitting: Family Medicine

## 2022-01-09 NOTE — Chronic Care Management (AMB) (Signed)
Contacted the patient to schedule home visit for CGM training.The patient states she is going through a tooth infection with swelling in the face. She is in misery with her mouth at this time but will call when she is over this and ready to schedule CGM repeat training .   Charlene Brooke, CPP notified  Avel Sensor, [place]  [phone]

## 2022-01-12 IMAGING — DX DG ABD PORTABLE 1V
1 series · 1 of 1 positions shown · non-contrast
Comparison: CT 07/10/2020

CLINICAL DATA: Abdominal pain, nausea

EXAM:
PORTABLE ABDOMEN - 1 VIEW

[abdomen kub]
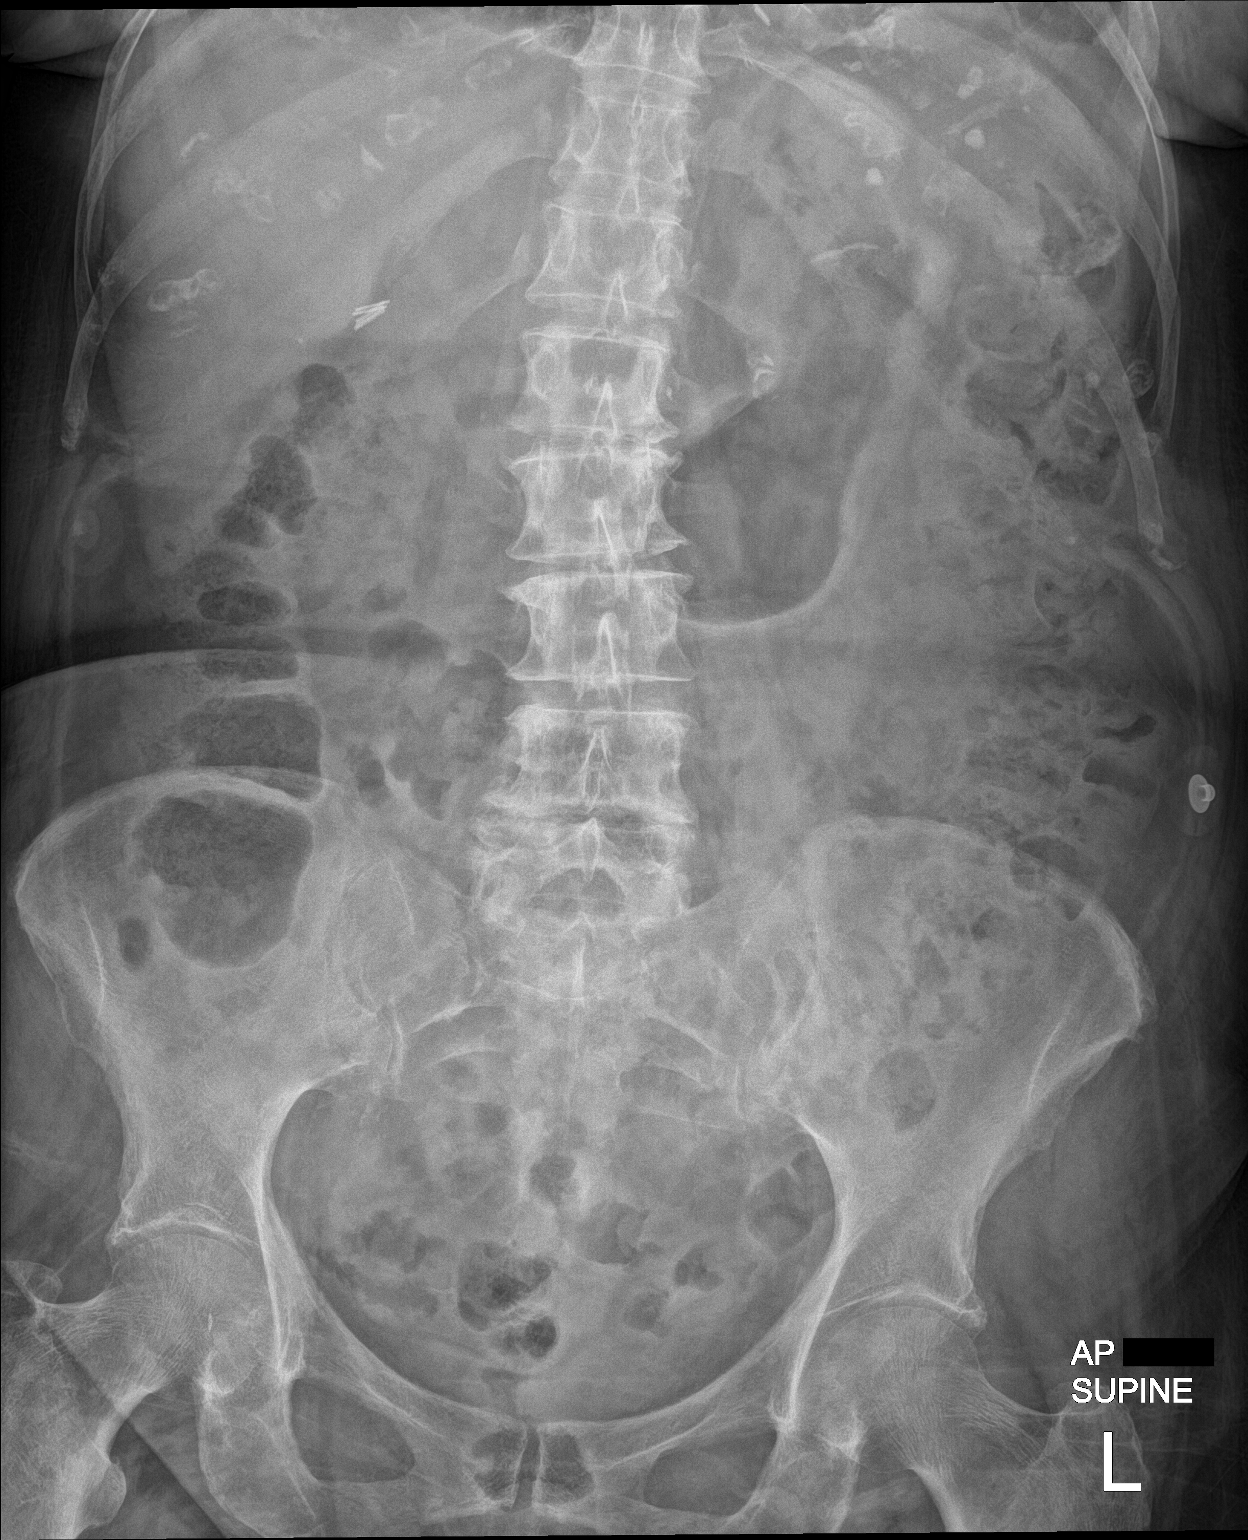

[1 of 1 positions shown; findings below may reference images not displayed]

FINDINGS: Nonobstructive bowel gas pattern. Moderate stool burden throughout
the colon. Prior cholecystectomy. No organomegaly or free air.
IMPRESSION: Moderate stool burden.  No acute findings.

## 2022-01-16 ENCOUNTER — Telehealth: Payer: Self-pay

## 2022-01-16 NOTE — Chronic Care Management (AMB) (Addendum)
Chronic Care Management Pharmacy Assistant   Name: Maria Fox  MRN: [id number] DOB: [birth year]   Reason for Encounter: Medication Adherence and Delivery Coordination  Attempted contact with Maria Fox 3 times on 01/17/22,01/18/22,01/21/22 Unsuccessful outreach.  Recent office visits:  None since last CCM contact  Recent consult visits:  None since last CCM contact  Hospital visits:  None in previous 6 months 01/04/22-Maria Allen,MD([facility] ED)-dental infection,start clindamycin 16m.-Patient with likely odontogenic infection.  No evidence of [name] angina.  Laboratory studies reviewed and are without significant abnormality.  Will place on antibiotics and return precautions given.  Patient given Zofran for nausea here -discharged home   Medications: Outpatient Encounter Medications as of 01/16/2022  Medication Sig Note   acetaminophen (TYLENOL) 500 MG tablet Take 500 mg by mouth as needed for moderate pain.    acyclovir ointment (ZOVIRAX) 5 % Apply topically 5 (five) times daily. For 4 days    albuterol (PROAIR HFA) 108 (90 Base) MCG/ACT inhaler Inhale 2 puffs into the lungs every 6 (six) hours as needed for wheezing or shortness of breath.    Aloe Vera 72 % CREA Apply to burned areas four times daily.    atorvastatin (LIPITOR) 40 MG tablet TAKE ONE TABLET BY MOUTH EVERY EVENING    Blood Glucose Monitoring Suppl (ONE TOUCH ULTRA 2) w/Device KIT Use as instructed to check blood sugar 2 times daily as needed.    clindamycin (CLEOCIN) 150 MG capsule Take 1 capsule (150 mg total) by mouth every 6 (six) hours.    clindamycin (CLEOCIN) 300 MG capsule Take 1 capsule (300 mg total) by mouth 3 (three) times daily.    clopidogrel (PLAVIX) 75 MG tablet Take 1 tablet (75 mg total) by mouth daily.    dapagliflozin propanediol (FARXIGA) 5 MG TABS tablet TAKE ONE TABLET BY MOUTH EVERY MORNING    docusate sodium (COLACE) 100 MG capsule Take 1 capsule (100 mg total) by mouth  daily. (Patient taking differently: Take 100 mg by mouth daily as needed for mild constipation.) 07/01/2020: Takes as needed   doxycycline (VIBRAMYCIN) 100 MG capsule Take 1 capsule (100 mg total) by mouth 2 (two) times daily.    DULoxetine (CYMBALTA) 30 MG capsule TAKE ONE CAPSULE BY MOUTH EVERY MORNING    ELIQUIS 5 MG TABS tablet TAKE ONE TABLET BY MOUTH TWICE DAILY    feeding supplement, GLUCERNA SHAKE, (GLUCERNA SHAKE) LIQD Take 237 mLs by mouth 3 (three) times daily between meals.    furosemide (LASIX) 20 MG tablet Take 1 tablet (20 mg total) by mouth daily as needed for fluid or edema (leg swelling).    gabapentin (NEURONTIN) 300 MG capsule TAKE ONE CAPSULE BY MOUTH TWICE DAILY    glipiZIDE (GLUCOTROL) 10 MG tablet Take 1 tablet (10 mg total) by mouth 2 (two) times daily.    glucose blood (ONETOUCH ULTRA) test strip Use as instructed to check blood sugar 2 times daily as needed    isosorbide mononitrate (IMDUR) 30 MG 24 hr tablet TAKE 1/2 TABLET BY MOUTH ONCE DAILY .    latanoprost (XALATAN) 0.005 % ophthalmic solution Place 1 drop into both eyes at bedtime.     metFORMIN (GLUCOPHAGE-XR) 500 MG 24 hr tablet TAKE TWO TABLETS BY MOUTH EVERY MORNING    metoprolol succinate (TOPROL-XL) 25 MG 24 hr tablet TAKE ONE TABLET BY MOUTH ONCE DAILY    nitroGLYCERIN (NITROSTAT) 0.4 MG SL tablet Dissolve 1 tab under tongue as needed for chest pain. May  repeat every 5 minutes x 2 doses. If no relief call 9-1-1.    ondansetron (ZOFRAN-ODT) 4 MG disintegrating tablet TAKE ONE TABLET BY MOUTH every EIGHT hours AS NEEDED FOR NAUSEA AND VOMITING    ondansetron (ZOFRAN-ODT) 8 MG disintegrating tablet Take 1 tablet (8 mg total) by mouth every 8 (eight) hours as needed for nausea or vomiting.    OneTouch Delica Lancets 40Z MISC Use as directed to check blood sugar 2 times daily as needed    pantoprazole (PROTONIX) 40 MG tablet TAKE ONE TABLET BY MOUTH TWICE DAILY    polyethylene glycol (MIRALAX / GLYCOLAX) 17 g  packet Take 17 g by mouth daily as needed for moderate constipation.    No facility-administered encounter medications on file as of 01/16/2022.   BP Readings from Last 3 Encounters:  01/04/22 (!) 136/95  11/06/21 128/84  07/04/21 (!) 157/83    Lab Results  Component Value Date   HGBA1C 9.5 (H) 10/30/2021      Recent OV, Consult or Hospital visit:   01/04/22- [facility] ED -dental infection- Will place on antibiotics and return precautions given.  Patient given Zofran for nausea here.    Last adherence delivery date:12/27/21      Patient is due for next adherence delivery on: 01/28/22  Multiple attempts made to reach patient. Unsuccessful outreach. Will refill based off of last adherence fill.   This delivery to include: Adherence Packaging  30 Days  Packs: Farxiga 5 mg - 1 tablet daily (1 breakfast)  Pantoprazole 40 mg - 1 tablet twice daily (1 breakfast, 1 evening meal) Gabapentin 300 mg- 1 tablet twice daily (1 breakfast, 1 evening meal) Duloxetine 30 mg - 1 capsule daily (1 breakfast) Atorvastatin 40 mg - 1 tablet daily (1 evening meal) Isosorbide Mononitrate 30 mg - 1/2 tablet daily (1/2 breakfast) Glipizide 10 mg - 1 tablet twice daily (1 breakfast, 1 evening meal) Metformin 500 mg ER - 2 tablets ( breakfast) Eliquis 5 mg- 1 tablet twice daily (1 breakfast, 1 evening meal) Metoprolol succinate 25 mg ER - 1 tablet daily  (breakfast) Clopidogrel 87m - Take 1 tablet daily ( breakfast)  VIAL medications: Latanoprost 0.005 % eye drops - 1 drop in each eye daily Ondansetron 454m take 1 tablet every 8 hours    Patient declined the following medications this month: none    Refills requested from providers include: atorvastatin clopidogrel,eliquis,duloxetine,metformin,isosorbide,ondansetron,latanoprost  Delivery scheduled for 01/28/22. Unable to speak with patient to confirm date.    Annual wellness visit in last year? Yes Most Recent BP reading: 128/84  If  Diabetic: Most recent A1C reading:9.5 Last eye exam / retinopathy screening:overdue Last diabetic foot exam:UTD  [facility].   LiCharlene BrookeCPP notified  [place]vel [place]ensor[phone]

## 2022-01-21 ENCOUNTER — Other Ambulatory Visit: Payer: Self-pay | Admitting: Family Medicine

## 2022-01-21 DIAGNOSIS — E785 Hyperlipidemia, unspecified: Secondary | ICD-10-CM

## 2022-01-23 ENCOUNTER — Other Ambulatory Visit: Payer: Self-pay | Admitting: Family Medicine

## 2022-01-23 NOTE — Telephone Encounter (Signed)
Zofran Last filled:  12/24/21, #20 Last OV:  11/06/21, CPE Next OV:  02/06/22, 3 mo DM f/u

## 2022-02-06 ENCOUNTER — Ambulatory Visit: Payer: Medicare Other | Admitting: Family Medicine

## 2022-02-07 ENCOUNTER — Encounter: Payer: Self-pay | Admitting: *Deleted

## 2022-02-13 ENCOUNTER — Other Ambulatory Visit: Payer: Self-pay | Admitting: Family Medicine

## 2022-02-13 NOTE — Telephone Encounter (Signed)
Gabapentin Last filled:  01/23/22, #180 Last OV:  11/06/21, CPE Next OV:  03/08/22, 3 mo DM f/u  Zofran last filled:  01/24/22, #20

## 2022-02-14 ENCOUNTER — Telehealth: Payer: Self-pay

## 2022-02-14 NOTE — Chronic Care Management (AMB) (Signed)
Chronic Care Management Pharmacy Assistant   Name: ANNE BOLTZ  MRN: [id number] DOB: 02-11-1946  Reason for Encounter: Medication Adherence and Delivery Cooperation   Unsuccessful outreach    Recent office visits:  None since last CCM contact  Recent consult visits:  None since last CCM contact  Hospital visits:  None in previous 6 months  Medications: Outpatient Encounter Medications as of 02/14/2022  Medication Sig Note   acetaminophen (TYLENOL) 500 MG tablet Take 500 mg by mouth as needed for moderate pain.    acyclovir ointment (ZOVIRAX) 5 % Apply topically 5 (five) times daily. For 4 days    albuterol (PROAIR HFA) 108 (90 Base) MCG/ACT inhaler Inhale 2 puffs into the lungs every 6 (six) hours as needed for wheezing or shortness of breath.    Aloe Vera 72 % CREA Apply to burned areas four times daily.    atorvastatin (LIPITOR) 40 MG tablet TAKE ONE TABLET BY MOUTH EVERY EVENING    Blood Glucose Monitoring Suppl (ONE TOUCH ULTRA 2) w/Device KIT Use as instructed to check blood sugar 2 times daily as needed.    clindamycin (CLEOCIN) 150 MG capsule Take 1 capsule (150 mg total) by mouth every 6 (six) hours.    clindamycin (CLEOCIN) 300 MG capsule Take 1 capsule (300 mg total) by mouth 3 (three) times daily.    clopidogrel (PLAVIX) 75 MG tablet Take 1 tablet (75 mg total) by mouth daily.    dapagliflozin propanediol (FARXIGA) 5 MG TABS tablet TAKE ONE TABLET BY MOUTH EVERY MORNING    docusate sodium (COLACE) 100 MG capsule Take 1 capsule (100 mg total) by mouth daily. (Patient taking differently: Take 100 mg by mouth daily as needed for mild constipation.) 07/01/2020: Takes as needed   doxycycline (VIBRAMYCIN) 100 MG capsule Take 1 capsule (100 mg total) by mouth 2 (two) times daily.    DULoxetine (CYMBALTA) 30 MG capsule TAKE ONE CAPSULE BY MOUTH EVERY MORNING    ELIQUIS 5 MG TABS tablet TAKE ONE TABLET BY MOUTH TWICE DAILY    feeding supplement, GLUCERNA SHAKE, (GLUCERNA SHAKE)  LIQD Take 237 mLs by mouth 3 (three) times daily between meals.    furosemide (LASIX) 20 MG tablet Take 1 tablet (20 mg total) by mouth daily as needed for fluid or edema (leg swelling).    gabapentin (NEURONTIN) 300 MG capsule TAKE ONE CAPSULE BY MOUTH TWICE DAILY    glipiZIDE (GLUCOTROL) 10 MG tablet Take 1 tablet (10 mg total) by mouth 2 (two) times daily.    glucose blood (ONETOUCH ULTRA) test strip Use as instructed to check blood sugar 2 times daily as needed    isosorbide mononitrate (IMDUR) 30 MG 24 hr tablet TAKE 1/2 TABLET BY MOUTH ONCE DAILY .    latanoprost (XALATAN) 0.005 % ophthalmic solution Place 1 drop into both eyes at bedtime.     metFORMIN (GLUCOPHAGE-XR) 500 MG 24 hr tablet TAKE TWO TABLETS BY MOUTH EVERY MORNING    metoprolol succinate (TOPROL-XL) 25 MG 24 hr tablet TAKE ONE TABLET BY MOUTH ONCE DAILY    nitroGLYCERIN (NITROSTAT) 0.4 MG SL tablet Dissolve 1 tab under tongue as needed for chest pain. May repeat every 5 minutes x 2 doses. If no relief call 9-1-1.    ondansetron (ZOFRAN-ODT) 4 MG disintegrating tablet TAKE ONE TABLET BY MOUTH every EIGHT hours AS NEEDED FOR NAUSEA AND VOMITING    ondansetron (ZOFRAN-ODT) 8 MG disintegrating tablet Take 1 tablet (8 mg total) by mouth every 8 (  eight) hours as needed for nausea or vomiting.    OneTouch Delica Lancets 58E MISC Use as directed to check blood sugar 2 times daily as needed    pantoprazole (PROTONIX) 40 MG tablet TAKE ONE TABLET BY MOUTH TWICE DAILY    polyethylene glycol (MIRALAX / GLYCOLAX) 17 g packet Take 17 g by mouth daily as needed for moderate constipation.    No facility-administered encounter medications on file as of 02/14/2022.   BP Readings from Last 3 Encounters:  01/04/22 (!) 136/95  11/06/21 128/84  07/04/21 (!) 157/83    Pulse Readings from Last 3 Encounters:  01/04/22 (!) 106  11/06/21 88  07/04/21 73    Lab Results  Component Value Date/Time   HGBA1C 9.5 (H) 10/30/2021 02:52 PM   HGBA1C 8.3  (A) 01/26/2021 03:50 PM   HGBA1C 11.6 (H) 07/03/2020 09:37 AM   Lab Results  Component Value Date   CREATININE 1.28 (H) 01/04/2022   BUN 21 01/04/2022   GFR 32.12 (L) 10/30/2021   GFRNONAA 43 (L) 01/04/2022   GFRAA 41 (L) 01/28/2020   NA 135 01/04/2022   K 4.1 01/04/2022   CALCIUM 9.3 01/04/2022   CO2 21 (L) 01/04/2022     Last adherence delivery date:01/28/22      Patient is due for next adherence delivery on: 02/26/22  Multiple attempts made to reach patient. Unsuccessful outreach. Will refill based off of last adherence fill.   This delivery to include: Adherence Packaging  30 Days  Farxiga 5 mg - 1 tablet daily (1 breakfast)  Pantoprazole 40 mg - 1 tablet twice daily (1 breakfast, 1 evening meal) Gabapentin 300 mg- 1 tablet twice daily (1 breakfast, 1 evening meal) Duloxetine 30 mg - 1 capsule daily (1 breakfast) Atorvastatin 40 mg - 1 tablet daily (1 evening meal) Isosorbide Mononitrate 30 mg - 1/2 tablet daily (1/2 breakfast) Glipizide 10 mg - 1 tablet twice daily (1 breakfast, 1 evening meal) Metformin 500 mg ER - 2 tablets ( breakfast) Eliquis 5 mg- 1 tablet twice daily (1 breakfast, 1 evening meal) Metoprolol succinate 25 mg ER - 1 tablet daily  (breakfast) Clopidogrel 73m - Take 1 tablet daily ( breakfast)  VIAL medications: Latanoprost 0.005 % eye drops - 1 drop in each eye daily Ondansetron 421m take 1 tablet every 8 hours     Refills requested from providers include: gabapentin ,ondansetron  Delivery scheduled for 02/26/22. Unable to speak with patient to confirm date.    Annual wellness visit in last year? Yes Most Recent BP reading:128/84  If Diabetic: Most recent A1C reading:9.5 Last eye exam / retinopathy screening:overdue Last diabetic foot exam:UTD  Cycle dispensing form sent to Ma[facility] Riserfor review. Next CCM appointment: none   LiCharlene BrookeCPP notified  VeAvel [phone]

## 2022-02-14 NOTE — Telephone Encounter (Signed)
ERx 

## 2022-02-23 ENCOUNTER — Other Ambulatory Visit: Payer: Self-pay

## 2022-02-23 ENCOUNTER — Emergency Department (HOSPITAL_COMMUNITY): Payer: Medicare Other

## 2022-02-23 ENCOUNTER — Encounter (HOSPITAL_COMMUNITY): Payer: Self-pay | Admitting: Internal Medicine

## 2022-02-23 ENCOUNTER — Inpatient Hospital Stay (HOSPITAL_COMMUNITY)
Admission: EM | Admit: 2022-02-23 | Discharge: 2022-03-12 | DRG: 194 | Disposition: A | Discharge status: Home / Self Care | Payer: Medicare Other | Attending: Internal Medicine | Admitting: Internal Medicine

## 2022-02-23 DIAGNOSIS — N1831 Chronic kidney disease, stage 3a: Secondary | ICD-10-CM | POA: Diagnosis present

## 2022-02-23 DIAGNOSIS — Z7902 Long term (current) use of antithrombotics/antiplatelets: Secondary | ICD-10-CM

## 2022-02-23 DIAGNOSIS — I4892 Unspecified atrial flutter: Secondary | ICD-10-CM | POA: Diagnosis not present

## 2022-02-23 DIAGNOSIS — T373X5A Adverse effect of other antiprotozoal drugs, initial encounter: Secondary | ICD-10-CM | POA: Diagnosis not present

## 2022-02-23 DIAGNOSIS — Z8701 Personal history of pneumonia (recurrent): Secondary | ICD-10-CM

## 2022-02-23 DIAGNOSIS — N1832 Chronic kidney disease, stage 3b: Secondary | ICD-10-CM | POA: Diagnosis present

## 2022-02-23 DIAGNOSIS — I5032 Chronic diastolic (congestive) heart failure: Secondary | ICD-10-CM | POA: Diagnosis present

## 2022-02-23 DIAGNOSIS — Z9049 Acquired absence of other specified parts of digestive tract: Secondary | ICD-10-CM

## 2022-02-23 DIAGNOSIS — I081 Rheumatic disorders of both mitral and tricuspid valves: Secondary | ICD-10-CM | POA: Diagnosis not present

## 2022-02-23 DIAGNOSIS — I471 Supraventricular tachycardia, unspecified: Secondary | ICD-10-CM | POA: Diagnosis present

## 2022-02-23 DIAGNOSIS — E876 Hypokalemia: Secondary | ICD-10-CM | POA: Diagnosis not present

## 2022-02-23 DIAGNOSIS — Z800 Family history of malignant neoplasm of digestive organs: Secondary | ICD-10-CM

## 2022-02-23 DIAGNOSIS — R110 Nausea: Secondary | ICD-10-CM | POA: Diagnosis not present

## 2022-02-23 DIAGNOSIS — E860 Dehydration: Secondary | ICD-10-CM | POA: Diagnosis not present

## 2022-02-23 DIAGNOSIS — J159 Unspecified bacterial pneumonia: Secondary | ICD-10-CM | POA: Diagnosis present

## 2022-02-23 DIAGNOSIS — E872 Acidosis, unspecified: Secondary | ICD-10-CM | POA: Diagnosis not present

## 2022-02-23 DIAGNOSIS — N179 Acute kidney failure, unspecified: Secondary | ICD-10-CM | POA: Diagnosis not present

## 2022-02-23 DIAGNOSIS — Z91018 Allergy to other foods: Secondary | ICD-10-CM

## 2022-02-23 DIAGNOSIS — Z833 Family history of diabetes mellitus: Secondary | ICD-10-CM

## 2022-02-23 DIAGNOSIS — Z91041 Radiographic dye allergy status: Secondary | ICD-10-CM

## 2022-02-23 DIAGNOSIS — R112 Nausea with vomiting, unspecified: Secondary | ICD-10-CM | POA: Diagnosis present

## 2022-02-23 DIAGNOSIS — I472 Ventricular tachycardia, unspecified: Secondary | ICD-10-CM | POA: Diagnosis not present

## 2022-02-23 DIAGNOSIS — E1165 Type 2 diabetes mellitus with hyperglycemia: Secondary | ICD-10-CM | POA: Diagnosis present

## 2022-02-23 DIAGNOSIS — R197 Diarrhea, unspecified: Secondary | ICD-10-CM | POA: Diagnosis present

## 2022-02-23 DIAGNOSIS — I700 Atherosclerosis of aorta: Secondary | ICD-10-CM | POA: Diagnosis not present

## 2022-02-23 DIAGNOSIS — Z86718 Personal history of other venous thrombosis and embolism: Secondary | ICD-10-CM

## 2022-02-23 DIAGNOSIS — Z79899 Other long term (current) drug therapy: Secondary | ICD-10-CM

## 2022-02-23 DIAGNOSIS — I6381 Other cerebral infarction due to occlusion or stenosis of small artery: Secondary | ICD-10-CM | POA: Diagnosis not present

## 2022-02-23 DIAGNOSIS — E785 Hyperlipidemia, unspecified: Secondary | ICD-10-CM | POA: Diagnosis not present

## 2022-02-23 DIAGNOSIS — I4510 Unspecified right bundle-branch block: Secondary | ICD-10-CM | POA: Diagnosis present

## 2022-02-23 DIAGNOSIS — E1142 Type 2 diabetes mellitus with diabetic polyneuropathy: Secondary | ICD-10-CM | POA: Diagnosis not present

## 2022-02-23 DIAGNOSIS — R2689 Other abnormalities of gait and mobility: Secondary | ICD-10-CM | POA: Diagnosis present

## 2022-02-23 DIAGNOSIS — I130 Hypertensive heart and chronic kidney disease with heart failure and stage 1 through stage 4 chronic kidney disease, or unspecified chronic kidney disease: Secondary | ICD-10-CM | POA: Diagnosis not present

## 2022-02-23 DIAGNOSIS — E1151 Type 2 diabetes mellitus with diabetic peripheral angiopathy without gangrene: Secondary | ICD-10-CM | POA: Diagnosis present

## 2022-02-23 DIAGNOSIS — K219 Gastro-esophageal reflux disease without esophagitis: Secondary | ICD-10-CM | POA: Diagnosis present

## 2022-02-23 DIAGNOSIS — Z825 Family history of asthma and other chronic lower respiratory diseases: Secondary | ICD-10-CM

## 2022-02-23 DIAGNOSIS — J181 Lobar pneumonia, unspecified organism: Secondary | ICD-10-CM | POA: Clinically undetermined

## 2022-02-23 DIAGNOSIS — Z881 Allergy status to other antibiotic agents status: Secondary | ICD-10-CM

## 2022-02-23 DIAGNOSIS — Z8616 Personal history of COVID-19: Secondary | ICD-10-CM

## 2022-02-23 DIAGNOSIS — Z955 Presence of coronary angioplasty implant and graft: Secondary | ICD-10-CM

## 2022-02-23 DIAGNOSIS — D849 Immunodeficiency, unspecified: Secondary | ICD-10-CM | POA: Diagnosis not present

## 2022-02-23 DIAGNOSIS — Z87892 Personal history of anaphylaxis: Secondary | ICD-10-CM

## 2022-02-23 DIAGNOSIS — N183 Chronic kidney disease, stage 3 unspecified: Secondary | ICD-10-CM | POA: Diagnosis present

## 2022-02-23 DIAGNOSIS — L259 Unspecified contact dermatitis, unspecified cause: Secondary | ICD-10-CM | POA: Diagnosis present

## 2022-02-23 DIAGNOSIS — Z7901 Long term (current) use of anticoagulants: Secondary | ICD-10-CM

## 2022-02-23 DIAGNOSIS — Z885 Allergy status to narcotic agent status: Secondary | ICD-10-CM

## 2022-02-23 DIAGNOSIS — R519 Headache, unspecified: Secondary | ICD-10-CM | POA: Diagnosis not present

## 2022-02-23 DIAGNOSIS — I482 Chronic atrial fibrillation, unspecified: Secondary | ICD-10-CM | POA: Diagnosis present

## 2022-02-23 DIAGNOSIS — I491 Atrial premature depolarization: Secondary | ICD-10-CM | POA: Diagnosis not present

## 2022-02-23 DIAGNOSIS — J101 Influenza due to other identified influenza virus with other respiratory manifestations: Secondary | ICD-10-CM | POA: Diagnosis present

## 2022-02-23 DIAGNOSIS — I10 Essential (primary) hypertension: Secondary | ICD-10-CM | POA: Diagnosis present

## 2022-02-23 DIAGNOSIS — Z880 Allergy status to penicillin: Secondary | ICD-10-CM

## 2022-02-23 DIAGNOSIS — E1149 Type 2 diabetes mellitus with other diabetic neurological complication: Secondary | ICD-10-CM | POA: Diagnosis present

## 2022-02-23 DIAGNOSIS — R1084 Generalized abdominal pain: Secondary | ICD-10-CM | POA: Diagnosis present

## 2022-02-23 DIAGNOSIS — Z91030 Bee allergy status: Secondary | ICD-10-CM

## 2022-02-23 DIAGNOSIS — Z8673 Personal history of transient ischemic attack (TIA), and cerebral infarction without residual deficits: Secondary | ICD-10-CM

## 2022-02-23 DIAGNOSIS — J1008 Influenza due to other identified influenza virus with other specified pneumonia: Secondary | ICD-10-CM | POA: Diagnosis present

## 2022-02-23 DIAGNOSIS — Z66 Do not resuscitate: Secondary | ICD-10-CM | POA: Diagnosis not present

## 2022-02-23 DIAGNOSIS — N390 Urinary tract infection, site not specified: Secondary | ICD-10-CM | POA: Diagnosis not present

## 2022-02-23 DIAGNOSIS — B961 Klebsiella pneumoniae [K. pneumoniae] as the cause of diseases classified elsewhere: Secondary | ICD-10-CM | POA: Diagnosis present

## 2022-02-23 DIAGNOSIS — Z8049 Family history of malignant neoplasm of other genital organs: Secondary | ICD-10-CM

## 2022-02-23 DIAGNOSIS — Z83438 Family history of other disorder of lipoprotein metabolism and other lipidemia: Secondary | ICD-10-CM

## 2022-02-23 DIAGNOSIS — E1122 Type 2 diabetes mellitus with diabetic chronic kidney disease: Secondary | ICD-10-CM | POA: Diagnosis present

## 2022-02-23 DIAGNOSIS — Z7984 Long term (current) use of oral hypoglycemic drugs: Secondary | ICD-10-CM

## 2022-02-23 DIAGNOSIS — Z951 Presence of aortocoronary bypass graft: Secondary | ICD-10-CM

## 2022-02-23 DIAGNOSIS — B9629 Other Escherichia coli [E. coli] as the cause of diseases classified elsewhere: Secondary | ICD-10-CM | POA: Diagnosis present

## 2022-02-23 DIAGNOSIS — Z882 Allergy status to sulfonamides status: Secondary | ICD-10-CM

## 2022-02-23 DIAGNOSIS — Z808 Family history of malignant neoplasm of other organs or systems: Secondary | ICD-10-CM

## 2022-02-23 DIAGNOSIS — E1143 Type 2 diabetes mellitus with diabetic autonomic (poly)neuropathy: Secondary | ICD-10-CM | POA: Diagnosis present

## 2022-02-23 DIAGNOSIS — I2510 Atherosclerotic heart disease of native coronary artery without angina pectoris: Secondary | ICD-10-CM | POA: Diagnosis present

## 2022-02-23 DIAGNOSIS — E875 Hyperkalemia: Secondary | ICD-10-CM | POA: Diagnosis not present

## 2022-02-23 DIAGNOSIS — E113293 Type 2 diabetes mellitus with mild nonproliferative diabetic retinopathy without macular edema, bilateral: Secondary | ICD-10-CM

## 2022-02-23 DIAGNOSIS — R109 Unspecified abdominal pain: Secondary | ICD-10-CM | POA: Diagnosis not present

## 2022-02-23 DIAGNOSIS — Z8249 Family history of ischemic heart disease and other diseases of the circulatory system: Secondary | ICD-10-CM

## 2022-02-23 LAB — CBC WITH DIFFERENTIAL/PLATELET
Abs Immature Granulocytes: 0.02 10*3/uL (ref 0.00–0.07)
Basophils Absolute: 0 10*3/uL (ref 0.0–0.1)
Basophils Relative: 0 %
Eosinophils Absolute: 0.1 10*3/uL (ref 0.0–0.5)
Eosinophils Relative: 1 %
HCT: 41.4 % (ref 36.0–46.0)
Hemoglobin: 13.4 g/dL (ref 12.0–15.0)
Immature Granulocytes: 0 %
Lymphocytes Relative: 27 %
Lymphs Abs: 1.6 10*3/uL (ref 0.7–4.0)
MCH: 29.6 pg (ref 26.0–34.0)
MCHC: 32.4 g/dL (ref 30.0–36.0)
MCV: 91.6 fL (ref 80.0–100.0)
Monocytes Absolute: 0.4 10*3/uL (ref 0.1–1.0)
Monocytes Relative: 7 %
Neutro Abs: 3.8 10*3/uL (ref 1.7–7.7)
Neutrophils Relative %: 65 %
Platelets: 181 10*3/uL (ref 150–400)
RBC: 4.52 MIL/uL (ref 3.87–5.11)
RDW: 13.5 % (ref 11.5–15.5)
WBC: 5.8 10*3/uL (ref 4.0–10.5)
nRBC: 0 % (ref 0.0–0.2)

## 2022-02-23 LAB — COMPREHENSIVE METABOLIC PANEL
ALT: 17 U/L (ref 0–44)
AST: 22 U/L (ref 15–41)
Albumin: 3.7 g/dL (ref 3.5–5.0)
Alkaline Phosphatase: 63 U/L (ref 38–126)
Anion gap: 15 (ref 5–15)
BUN: 19 mg/dL (ref 8–23)
CO2: 17 mmol/L — ABNORMAL LOW (ref 22–32)
Calcium: 8.7 mg/dL — ABNORMAL LOW (ref 8.9–10.3)
Chloride: 108 mmol/L (ref 98–111)
Creatinine, Ser: 1.17 mg/dL — ABNORMAL HIGH (ref 0.44–1.00)
GFR, Estimated: 48 mL/min — ABNORMAL LOW (ref >60)
Glucose, Bld: 269 mg/dL — ABNORMAL HIGH (ref 70–99)
Potassium: 4.3 mmol/L (ref 3.5–5.1)
Sodium: 140 mmol/L (ref 135–145)
Total Bilirubin: 0.9 mg/dL (ref 0.3–1.2)
Total Protein: 7.2 g/dL (ref 6.5–8.1)

## 2022-02-23 LAB — BRAIN NATRIURETIC PEPTIDE: B Natriuretic Peptide: 67.7 pg/mL (ref 0.0–100.0)

## 2022-02-23 LAB — RESP PANEL BY RT-PCR (RSV, FLU A&B, COVID)  RVPGX2
Influenza A by PCR: POSITIVE — AB
Influenza B by PCR: NEGATIVE
Resp Syncytial Virus by PCR: NEGATIVE
SARS Coronavirus 2 by RT PCR: NEGATIVE

## 2022-02-23 LAB — LIPASE, BLOOD: Lipase: 53 U/L — ABNORMAL HIGH (ref 11–51)

## 2022-02-23 MED ORDER — ONDANSETRON HCL 4 MG/2ML IJ SOLN
4.0000 mg | Freq: Four times a day (QID) | INTRAMUSCULAR | Status: DC | PRN
  Administered 2022-02-24 – 2022-02-25 (×2): 4 mg via INTRAVENOUS
  Filled 2022-02-23 (×2): qty 2

## 2022-02-23 MED ORDER — LACTATED RINGERS IV SOLN: INTRAVENOUS | Status: AC

## 2022-02-23 MED ORDER — OSELTAMIVIR PHOSPHATE 30 MG PO CAPS
30.0000 mg | ORAL_CAPSULE | Freq: Two times a day (BID) | ORAL | Status: AC
  Administered 2022-02-24 – 2022-02-28 (×10): 30 mg via ORAL
  Filled 2022-02-23 (×10): qty 1

## 2022-02-23 MED ORDER — ONDANSETRON HCL 4 MG/2ML IJ SOLN
4.0000 mg | Freq: Once | INTRAMUSCULAR | Status: AC
  Administered 2022-02-23: 4 mg via INTRAVENOUS
  Filled 2022-02-23: qty 2

## 2022-02-23 MED ORDER — SODIUM CHLORIDE 0.9 % IV BOLUS
1000.0000 mL | Freq: Once | INTRAVENOUS | Status: AC
  Administered 2022-02-23: 1000 mL via INTRAVENOUS

## 2022-02-23 MED ORDER — HYDROMORPHONE HCL 1 MG/ML IJ SOLN
0.5000 mg | Freq: Once | INTRAMUSCULAR | Status: AC
  Administered 2022-02-23: 0.5 mg via INTRAVENOUS
  Filled 2022-02-23: qty 1

## 2022-02-23 MED ORDER — APIXABAN 5 MG PO TABS
5.0000 mg | ORAL_TABLET | Freq: Two times a day (BID) | ORAL | Status: DC
  Administered 2022-02-24 – 2022-03-12 (×34): 5 mg via ORAL
  Filled 2022-02-23 (×35): qty 1

## 2022-02-23 MED ORDER — POLYETHYLENE GLYCOL 3350 17 G PO PACK: 17.0000 g | PACK | Freq: Every day | ORAL | Status: DC | PRN

## 2022-02-23 MED ORDER — ACETAMINOPHEN 500 MG PO TABS
500.0000 mg | ORAL_TABLET | Freq: Three times a day (TID) | ORAL | Status: DC | PRN
  Administered 2022-02-24 – 2022-03-02 (×9): 1000 mg via ORAL
  Filled 2022-02-23 (×9): qty 2

## 2022-02-23 MED ORDER — METFORMIN HCL ER 500 MG PO TB24
1000.0000 mg | ORAL_TABLET | Freq: Every day | ORAL | Status: DC
  Administered 2022-02-24 – 2022-02-27 (×4): 1000 mg via ORAL
  Filled 2022-02-23 (×4): qty 2

## 2022-02-23 MED ORDER — DAPAGLIFLOZIN PROPANEDIOL 5 MG PO TABS
5.0000 mg | ORAL_TABLET | Freq: Every day | ORAL | Status: DC
  Filled 2022-02-23: qty 1

## 2022-02-23 MED ORDER — DULOXETINE HCL 30 MG PO CPEP
30.0000 mg | ORAL_CAPSULE | Freq: Every day | ORAL | Status: DC
  Administered 2022-02-24 – 2022-03-11 (×16): 30 mg via ORAL
  Filled 2022-02-23 (×16): qty 1

## 2022-02-23 MED ORDER — CLOPIDOGREL BISULFATE 75 MG PO TABS
75.0000 mg | ORAL_TABLET | Freq: Every day | ORAL | Status: DC
  Administered 2022-02-24 – 2022-03-12 (×17): 75 mg via ORAL
  Filled 2022-02-23 (×17): qty 1

## 2022-02-23 MED ORDER — ALBUTEROL SULFATE (2.5 MG/3ML) 0.083% IN NEBU: 3.0000 mL | INHALATION_SOLUTION | Freq: Four times a day (QID) | RESPIRATORY_TRACT | Status: DC | PRN

## 2022-02-23 MED ORDER — GABAPENTIN 300 MG PO CAPS
300.0000 mg | ORAL_CAPSULE | Freq: Two times a day (BID) | ORAL | Status: DC
  Administered 2022-02-24 – 2022-03-12 (×34): 300 mg via ORAL
  Filled 2022-02-23 (×34): qty 1

## 2022-02-23 MED ORDER — INSULIN ASPART 100 UNIT/ML IJ SOLN
0.0000 [IU] | Freq: Three times a day (TID) | INTRAMUSCULAR | Status: DC
  Administered 2022-02-24: 2 [IU] via SUBCUTANEOUS
  Administered 2022-02-24 – 2022-02-25 (×2): 3 [IU] via SUBCUTANEOUS
  Administered 2022-02-25: 2 [IU] via SUBCUTANEOUS
  Administered 2022-02-25 – 2022-02-26 (×2): 3 [IU] via SUBCUTANEOUS
  Administered 2022-02-26: 2 [IU] via SUBCUTANEOUS
  Administered 2022-02-26: 3 [IU] via SUBCUTANEOUS
  Administered 2022-02-27: 2 [IU] via SUBCUTANEOUS
  Administered 2022-02-27 (×2): 3 [IU] via SUBCUTANEOUS
  Administered 2022-02-28: 2 [IU] via SUBCUTANEOUS
  Administered 2022-03-01: 3 [IU] via SUBCUTANEOUS
  Administered 2022-03-01: 5 [IU] via SUBCUTANEOUS
  Administered 2022-03-01 – 2022-03-02 (×4): 3 [IU] via SUBCUTANEOUS
  Administered 2022-03-03 – 2022-03-04 (×2): 2 [IU] via SUBCUTANEOUS
  Administered 2022-03-05 – 2022-03-06 (×3): 3 [IU] via SUBCUTANEOUS
  Administered 2022-03-06: 2 [IU] via SUBCUTANEOUS
  Administered 2022-03-07: 8 [IU] via SUBCUTANEOUS
  Administered 2022-03-07 (×2): 3 [IU] via SUBCUTANEOUS
  Administered 2022-03-08: 5 [IU] via SUBCUTANEOUS
  Administered 2022-03-08: 3 [IU] via SUBCUTANEOUS
  Administered 2022-03-08: 2 [IU] via SUBCUTANEOUS
  Administered 2022-03-09: 3 [IU] via SUBCUTANEOUS
  Administered 2022-03-09 (×2): 5 [IU] via SUBCUTANEOUS
  Administered 2022-03-10: 18 [IU] via SUBCUTANEOUS
  Administered 2022-03-10 – 2022-03-11 (×3): 5 [IU] via SUBCUTANEOUS
  Administered 2022-03-11: 8 [IU] via SUBCUTANEOUS
  Administered 2022-03-11 – 2022-03-12 (×2): 3 [IU] via SUBCUTANEOUS
  Filled 2022-02-23: qty 0.15

## 2022-02-23 MED ORDER — ATORVASTATIN CALCIUM 40 MG PO TABS
40.0000 mg | ORAL_TABLET | Freq: Every day | ORAL | Status: DC
  Administered 2022-02-24 – 2022-03-11 (×15): 40 mg via ORAL
  Filled 2022-02-23 (×15): qty 1

## 2022-02-23 MED ORDER — NITROGLYCERIN 0.4 MG SL SUBL: 0.4000 mg | SUBLINGUAL_TABLET | SUBLINGUAL | Status: DC | PRN

## 2022-02-23 MED ORDER — ISOSORBIDE MONONITRATE ER 30 MG PO TB24
15.0000 mg | ORAL_TABLET | Freq: Every day | ORAL | Status: DC
  Administered 2022-02-24 – 2022-03-12 (×17): 15 mg via ORAL
  Filled 2022-02-23 (×17): qty 1

## 2022-02-23 MED ORDER — FUROSEMIDE 20 MG PO TABS: 20.0000 mg | ORAL_TABLET | Freq: Four times a day (QID) | ORAL | Status: DC | PRN

## 2022-02-23 MED ORDER — ZINC OXIDE 11.3 % EX CREA
TOPICAL_CREAM | Freq: Two times a day (BID) | CUTANEOUS | Status: DC
  Administered 2022-02-24 – 2022-03-07 (×5): 1 via TOPICAL
  Filled 2022-02-23 (×3): qty 56

## 2022-02-23 MED ORDER — GLIPIZIDE 10 MG PO TABS: 10.0000 mg | ORAL_TABLET | Freq: Two times a day (BID) | ORAL | Status: DC

## 2022-02-23 MED ORDER — METOPROLOL SUCCINATE ER 25 MG PO TB24
25.0000 mg | ORAL_TABLET | Freq: Every day | ORAL | Status: DC
  Administered 2022-02-24 – 2022-02-27 (×4): 25 mg via ORAL
  Filled 2022-02-23 (×4): qty 1

## 2022-02-23 MED ORDER — SODIUM CHLORIDE 0.9 % IV SOLN: INTRAVENOUS | Status: DC

## 2022-02-23 MED ORDER — PANTOPRAZOLE SODIUM 40 MG PO TBEC
40.0000 mg | DELAYED_RELEASE_TABLET | Freq: Two times a day (BID) | ORAL | Status: DC
  Administered 2022-02-24 – 2022-03-12 (×33): 40 mg via ORAL
  Filled 2022-02-23 (×34): qty 1

## 2022-02-23 MED ORDER — DOCUSATE SODIUM 100 MG PO CAPS: 100.0000 mg | ORAL_CAPSULE | Freq: Every day | ORAL | Status: DC | PRN

## 2022-02-23 NOTE — Assessment & Plan Note (Signed)
Serum creatinine mildly elevated 2/2 dehydration.  Plan Rehydrate as above  F/u Bmet in AM

## 2022-02-23 NOTE — Assessment & Plan Note (Signed)
Well compensated. BNP nl range. Patient stopped taking lasix  Plan I&Os  Daily weights  Lasix 20 mg PO q 6 prn LE edema, weight gain > 5 lbs/day

## 2022-02-23 NOTE — Assessment & Plan Note (Signed)
Last A1C 10/30/21 9.5%.  Plan Continue home regimen, including Farxiga  A1C  Diet- full liquid for now then low carb/heart healthy.when stable

## 2022-02-23 NOTE — Assessment & Plan Note (Signed)
Patient with fluid losses due to N/V and diarrhea secondary to influenza A. She received 1L IV fluid bous in ED  Plan Continue LR at 100 cc/hr x 10 hrs then reduce rate to 75 cc/hr  Orthostatic vitals daily

## 2022-02-23 NOTE — Assessment & Plan Note (Addendum)
Appears in sinus rhythm on exam and telemetry.  Plan Continue home meds including Eliquis  No indication for continuous telemetry

## 2022-02-23 NOTE — ED Notes (Addendum)
Pt had a bowel movement in the bed.  Full linen change, pt's bottom is bleeding.  Pt states she feel "drunk" meaning dizzy.  Pt is very weak but able to move back and forth in bed.  She is alert and oriented at this time.

## 2022-02-23 NOTE — Subjective & Objective (Signed)
Maria Fox, a 76 y/o with a past medical history to include hypertension angioedema, hyperlipidemia, history of migraines,  automotive immune deficiency syndrome,  CVA in 2016, s/p post 2 vessel CABG 2016, LHC 2019 with PCI/DES, diabetes since 2014 type II,  chronic diastolic congestive heart failure since 2016,  coronary artery disease since 2016,  acute left PCA stroke in 2017,  COVID-19 infection in 2022,  deep vein thrombosis in 2021,  history of atrial flutter on Eliquis,. Tricuspid regurg,  mitral valve regurg,  right bundle branch block,  chronic kidney disease stage IIIb. Patient never used tobacco product.  She has had a week long illness with N/V and now diarrhea with subsequent decrease PO intake. She has become very weak to the point of not being able to ambulate. Due to her persistent symptoms and weakness she activated EMS. She was transported to [facility] for evaluation.

## 2022-02-23 NOTE — ED Provider Notes (Signed)
[facility] DEPT Provider Note   CSN: [id number] Arrival date & time: 02/23/22  1508     History  Chief Complaint  Patient presents with   Nausea   Weakness   Emesis   Skin Ulcer    JENAI SCALETTA is a 76 y.o. female.  Patient brought in by EMS from home.  Talks about nausea and diarrhea and vomiting earlier in the week.  All ongoing for a week.  States multiple loose bowel movement yesterday has a rash on her buttocks area.  She had a lot of weakness for the past 2 days and unable to walk.  Triage note made mention of the people around her sick.  But patient really denies that.  Patient still with lots of nausea.  Patient also states she has some generalized abdominal pain.  Patient denies any upper respiratory stuff initially but now she kind of talks about feeling bad and some breathing issues questionably.  Denied any fevers no fevers here.  Patient is on the blood thinner Eliquis.    Patient with a very extensive past medical history to include hypertension angioedema hyperlipidemia history of migraines automotive immune deficiency syndrome CVA in 2016 post CABG diabetes since 2014 type II chronic diastolic congestive heart failure since 2016 coronary artery disease since 2016 acute left PCA stroke in 2017 COVID-19 infection in 2022 deep vein thrombosis in 2021 history of atrial flutter.  Tricuspid regurg mitral valve regurg right bundle branch block chronic kidney disease stage IIIb.  Patient never used tobacco products.  Last seen in the emergency department was October 27 of this year for dental infection prior to that seen in April 2023 for hand injury.       Home Medications Prior to Admission medications   Medication Sig Start Date End Date Taking? Authorizing Provider  acetaminophen (TYLENOL) 500 MG tablet Take 500 mg by mouth as needed for moderate pain.    [provider]  acyclovir ointment (ZOVIRAX) 5 % Apply topically 5 (five)  times daily. For 4 days 12/28/21   [name], MD  albuterol [facility] HFA) [phone] Base) MCG/ACT inhaler Inhale 2 puffs into the lungs every 6 (six) hours as needed for wheezing or shortness of breath. 05/10/20   [name], MD  Aloe Vera 72 % CREA Apply to burned areas four times daily. 09/09/20   Maudie Flakes, MD  atorvastatin (LIPITOR) 40 MG tablet TAKE ONE TABLET BY MOUTH EVERY EVENING 01/22/22   [name], MD  Blood Glucose Monitoring Suppl (ONE TOUCH ULTRA 2) w/Device KIT Use as instructed to check blood sugar 2 times daily as needed. 04/10/20   [name], MD  clindamycin (CLEOCIN) 150 MG capsule Take 1 capsule (150 mg total) by mouth every 6 (six) hours. 01/04/22   Lacretia Leigh, MD  clindamycin (CLEOCIN) 300 MG capsule Take 1 capsule (300 mg total) by mouth 3 (three) times daily. 01/05/22   Drenda Freeze, MD  clopidogrel (PLAVIX) 75 MG tablet Take 1 tablet (75 mg total) by mouth daily. 12/14/21   Sueanne Margarita, MD  dapagliflozin propanediol (FARXIGA) 5 MG TABS tablet TAKE ONE TABLET BY MOUTH EVERY MORNING 11/16/21   [name], MD  docusate sodium (COLACE) 100 MG capsule Take 1 capsule (100 mg total) by mouth daily. Patient taking differently: Take 100 mg by mouth daily as needed for mild constipation. 07/16/18   [name], MD  doxycycline (VIBRAMYCIN) 100 MG capsule Take 1 capsule (100 mg total)  by mouth 2 (two) times daily. 02/06/21   Raspet, Derry Skill, PA-C  DULoxetine (CYMBALTA) 30 MG capsule TAKE ONE CAPSULE BY MOUTH EVERY MORNING 12/14/21   [name], MD  ELIQUIS 5 MG TABS tablet TAKE ONE TABLET BY MOUTH TWICE DAILY 12/14/21   [name], MD  feeding supplement, GLUCERNA SHAKE, (GLUCERNA SHAKE) LIQD Take 237 mLs by mouth 3 (three) times daily between meals. 08/05/20   Mariel Aloe, MD  furosemide (LASIX) 20 MG tablet Take 1 tablet (20 mg total) by mouth daily as needed for fluid or edema (leg swelling). 07/27/20   Arrien, Jimmy Picket, MD  gabapentin (NEURONTIN) 300 MG capsule TAKE ONE CAPSULE BY MOUTH TWICE DAILY 02/14/22   [name], MD  glipiZIDE (GLUCOTROL) 10 MG tablet Take 1 tablet (10 mg total) by mouth 2 (two) times daily. 11/06/21   [name], MD  glucose blood [facility] ULTRA) test strip Use as instructed to check blood sugar 2 times daily as needed 08/10/19   [name], MD  isosorbide mononitrate (IMDUR) 30 MG 24 hr tablet TAKE 1/2 TABLET BY MOUTH ONCE DAILY . 12/14/21   Turner, Eber Hong, MD  latanoprost (XALATAN) 0.005 % ophthalmic solution Place 1 drop into both eyes at bedtime.  07/14/18   [provider]  metFORMIN (GLUCOPHAGE-XR) 500 MG 24 hr tablet TAKE TWO TABLETS BY MOUTH EVERY MORNING 12/14/21   [name], MD  metoprolol succinate (TOPROL-XL) 25 MG 24 hr tablet TAKE ONE TABLET BY MOUTH ONCE DAILY 11/16/21   [name], MD  nitroGLYCERIN (NITROSTAT) 0.4 MG SL tablet Dissolve 1 tab under tongue as needed for chest pain. May repeat every 5 minutes x 2 doses. If no relief call 9-1-1. 12/14/21   Sueanne Margarita, MD  ondansetron (ZOFRAN-ODT) 4 MG disintegrating tablet TAKE ONE TABLET BY MOUTH EVERY 8 HOURS AS NEEDED FOR NAUSEA AND VOMITING 02/14/22   [name], MD  ondansetron (ZOFRAN-ODT) 8 MG disintegrating tablet Take 1 tablet (8 mg total) by mouth every 8 (eight) hours as needed for nausea or vomiting. 01/04/22   Lacretia Leigh, MD  OneTouch Delica Lancets 37T MISC Use as directed to check blood sugar 2 times daily as needed 08/10/19   [name], MD  pantoprazole (PROTONIX) 40 MG tablet TAKE ONE TABLET BY MOUTH TWICE DAILY 11/16/21   [name], MD  polyethylene glycol (MIRALAX / GLYCOLAX) 17 g packet Take 17 g by mouth daily as needed for moderate constipation. 07/27/20   Arrien, Jimmy Picket, MD      Allergies    Bee venom, Mushroom extract complex, Penicillins, Codeine, Sulfa antibiotics, Iohexol, and Erythromycin base    Review of Systems    Review of Systems  Constitutional:  Negative for chills and fever.  HENT:  Negative for ear pain and sore throat.   Eyes:  Negative for pain and visual disturbance.  Respiratory:  Positive for shortness of breath. Negative for cough.   Cardiovascular:  Negative for chest pain and palpitations.  Gastrointestinal:  Positive for abdominal pain, diarrhea, nausea and vomiting.  Genitourinary:  Negative for dysuria and hematuria.  Musculoskeletal:  Negative for arthralgias and back pain.  Skin:  Positive for rash. Negative for color change.  Neurological:  Negative for seizures and syncope.  Hematological:  Bruises/bleeds easily.  All other systems reviewed and are negative.   Physical Exam Updated Vital Signs BP (!) 157/68 (BP Location: Right Arm)   Pulse 100   Temp (!) 97.3 F (36.3 C) (Oral)  Resp (!) 22   SpO2 95%  Physical Exam Vitals and nursing note reviewed.  Constitutional:      General: She is not in acute distress.    Appearance: Normal appearance. She is well-developed.  HENT:     Head: Normocephalic and atraumatic.     Mouth/Throat:     Mouth: Mucous membranes are dry.     Comments: Mucous membranes very dry. Eyes:     Extraocular Movements: Extraocular movements intact.     Conjunctiva/sclera: Conjunctivae normal.     Pupils: Pupils are equal, round, and reactive to light.  Cardiovascular:     Rate and Rhythm: Normal rate and regular rhythm.     Heart sounds: No murmur heard. Pulmonary:     Effort: Pulmonary effort is normal. No respiratory distress.     Breath sounds: Normal breath sounds. No wheezing, rhonchi or rales.  Abdominal:     General: There is no distension.     Palpations: Abdomen is soft.     Tenderness: There is no abdominal tenderness. There is no guarding.  Genitourinary:    Comments: Extensive buttocks rash similar to a diaper type dermatitis no open wounds. Musculoskeletal:        General: No swelling.     Cervical back: Normal range of  motion and neck supple.  Skin:    General: Skin is warm and dry.     Capillary Refill: Capillary refill takes less than 2 seconds.  Neurological:     General: No focal deficit present.     Mental Status: She is alert and oriented to person, place, and time.  Psychiatric:        Mood and Affect: Mood normal.     ED Results / Procedures / Treatments   Labs (all labs ordered are listed, but only abnormal results are displayed) Labs Reviewed  LIPASE, BLOOD - Abnormal; Notable for the following components:      Result Value   Lipase 53 (*)    All other components within normal limits  COMPREHENSIVE METABOLIC PANEL - Abnormal; Notable for the following components:   CO2 17 (*)    Glucose, Bld 269 (*)    Creatinine, Ser 1.17 (*)    Calcium 8.7 (*)    GFR, Estimated 48 (*)    All other components within normal limits  C DIFFICILE QUICK SCREEN W PCR REFLEX    RESP PANEL BY RT-PCR (RSV, FLU A&B, COVID)  [id number]  CBC WITH DIFFERENTIAL/PLATELET  BRAIN NATRIURETIC PEPTIDE  URINALYSIS, ROUTINE W REFLEX MICROSCOPIC    EKG None  Radiology CT ABDOMEN PELVIS WO CONTRAST  Result Date: 02/23/2022 CLINICAL DATA:  Abdominal pain.  Diarrhea. EXAM: CT ABDOMEN AND PELVIS WITHOUT CONTRAST TECHNIQUE: Multidetector CT imaging of the abdomen and pelvis was performed following the standard protocol without IV contrast. RADIATION DOSE REDUCTION: This exam was performed according to the departmental dose-optimization program which includes automated exposure control, adjustment of the mA and/or kV according to patient size and/or use of iterative reconstruction technique. COMPARISON:  Jul 30, 2020 CT the abdomen and pelvis FINDINGS: Lower chest: Mild opacity in the anterior left base on series 6, image 1 not fully assessed on this film. No abnormalities were identified on the chest x-ray from today the lung bases are otherwise unremarkable. The lower chest is otherwise unremarkable. Hepatobiliary: The liver  dome was not completely included on this study. Within visualized limits liver is unremarkable. The patient is status post cholecystectomy. Pancreas: Unremarkable. No pancreatic ductal  dilatation or surrounding inflammatory changes. Spleen: Calcified granulomata are identified in a normal-sized spleen. Adrenals/Urinary Tract: Adrenal glands are unremarkable. Kidneys are normal, without renal calculi, focal lesion, or hydronephrosis. Bladder is unremarkable. Stomach/Bowel: The stomach is normal in appearance. Evaluation of the small bowel is limited due the lack of oral contrast. No small bowel obstruction or abnormality identified. Scattered colonic diverticuli are identified without diverticulitis. The colon is otherwise unremarkable. The appendix is normal. Vascular/Lymphatic: Calcified atherosclerotic changes are identified in the nonaneurysmal aorta and iliac vessels. No adenopathy. Reproductive: Uterus and bilateral adnexa are unremarkable. Other: No abdominal wall hernia or abnormality. No abdominopelvic ascites. Musculoskeletal: No acute or significant osseous findings. IMPRESSION: 1. No cause for the patient's symptoms identified. 2. Mild opacity in the anterior left lung base is not fully assessed on this study. No infiltrate was identified on today's chest x-ray and atelectasis or scarring is a possibility. 3. Calcified atherosclerotic changes in the nonaneurysmal aorta and iliac vessels. 4. Scattered colonic diverticuli without diverticulitis. 5. Aortic atherosclerosis. Aortic Atherosclerosis (ICD10-I70.0). Electronically Signed   By: Dorise Bullion III M.D.   On: 02/23/2022 17:34   DG Chest Port 1 View  Result Date: 02/23/2022 CLINICAL DATA:  Nausea, vomiting, and diarrhea for about 1 week. EXAM: PORTABLE CHEST 1 VIEW COMPARISON:  Chest x-ray July 01, 2020 FINDINGS: The cardiomediastinal silhouette is unchanged in contour status post median sternotomy. Loop recorder in place. No focal pulmonary  opacity. No pleural effusion or pneumothorax. The visualized upper abdomen is unremarkable. No acute osseous abnormality. IMPRESSION: No acute cardiopulmonary abnormality. Electronically Signed   By: Beryle Flock M.D.   On: 02/23/2022 16:17    Procedures Procedures    Medications Ordered in ED Medications  0.9 %  sodium chloride infusion ( Intravenous New Bag/Given 02/23/22 1749)  sodium chloride 0.9 % bolus 1,000 mL (0 mLs Intravenous Stopped 02/23/22 1733)  ondansetron (ZOFRAN) injection 4 mg (4 mg Intravenous Given 02/23/22 1602)  HYDROmorphone (DILAUDID) injection 0.5 mg (0.5 mg Intravenous Given 02/23/22 1629)  ondansetron (ZOFRAN) injection 4 mg (4 mg Intravenous Given 02/23/22 1814)    ED Course/ Medical Decision Making/ A&P                           Medical Decision Making Amount and/or Complexity of Data Reviewed Labs: ordered. Radiology: ordered.  Risk Prescription drug management. Decision regarding hospitalization.   On exam patient appears to be very dehydrated.  Heart rate around 100 blood pressure 140/90.  Blood sugar was 223.  Oxygen sats on room air 99%.  Clinically patient appears to be dehydrated as mentioned above.  Also has a pretty extensive buttocks rash but no open wounds.  Symptoms could be viral.  White blood cell count 5.8 hemoglobin is 13.2 platelets are good at 181 BMP is normal at 67 chest x-ray showed no signs of pulmonary edema or infiltrates.  Complete metabolic panel CO2 was 17 glucose 269 GFR 48 which is kind of baseline for her creatinine was 1.17 liver function test normal lipase up a little bit at 53 anion gap was 15 reassuring.  CT scan of the abdomen without any acute findings.  They do raise some question about a mild opacity in the anterior left lung base not fully assessed on the study but they do make mention that no infiltrate was identified on today's chest x-ray.  Patient given 1 L of normal saline.  Then a maintenance rate of 100  cc  an hour.  Patient with nausea was given Zofran needed repeat Zofran so given Zofran x 2.  Patient also given low-dose of hydromorphone for abdominal pain this will also help with diarrhea.  Patient with the persistent nausea is not can be a good candidate to go home based on her on her dehydration although no evidence of acute kidney injury.  Feel the patient needs to be admitted so she can gain her strength back until she is well-hydrated.  CT of the abdomen pelvis without any acute process.  Patient made some mention of maybe upper respiratory infection.  So COVID and influenza and RSV has been ordered.  Patient has a history atrial flutter.  EKG is pending but cardiac monitor this basically shows a sinus tach.  Discussed with the hospitalist for admission.   Final Clinical Impression(s) / ED Diagnoses Final diagnoses:  Dehydration  Diarrhea, unspecified type  Nausea    Rx / DC Orders ED Discharge Orders     None         Fredia Sorrow, MD 02/23/22 [id number]

## 2022-02-23 NOTE — Assessment & Plan Note (Signed)
Patient reports rash on buttocks/perineum and pubic area developed over the past several days in conjunction with diarrhea. Erythematous macular rash with some bleeding.  Plan Bumex cream BID to affected area.

## 2022-02-23 NOTE — Assessment & Plan Note (Signed)
No active c/o  Plan Continue PPI

## 2022-02-23 NOTE — ED Triage Notes (Signed)
Pt arrived by EMS from home. Has had NVD for about a week, and has had weakness in the past 2 days and unable to walk. People she is around are also sick with NVD.Maria Fox    Her butt is sore and may have a pressure ulcer from diarrhea  HR 100 BP 148/90 RR 24 CBG 223 hx of diabetes  LAC 18 gauge

## 2022-02-23 NOTE — Assessment & Plan Note (Signed)
BP mildly elevated in ED.  Plan Continue home cardiac and antihypertensive meds
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
## 2022-02-23 NOTE — Assessment & Plan Note (Signed)
Respiratory panel positive for influenza A. Patient endorses rigors and chills at home. No documented fever. Suspect this is cause of N/V/D. Mild rhonchi on exam, no PNA on CXR  Plan Tamiflu 75 mg BID x 5 days.   Will advise SO to get Rx for Tamiflu 75 mg daily for 10 days unless symptomatic.

## 2022-02-24 ENCOUNTER — Encounter (HOSPITAL_COMMUNITY): Payer: Self-pay | Admitting: Internal Medicine

## 2022-02-24 DIAGNOSIS — E860 Dehydration: Secondary | ICD-10-CM | POA: Diagnosis not present

## 2022-02-24 DIAGNOSIS — R197 Diarrhea, unspecified: Secondary | ICD-10-CM

## 2022-02-24 LAB — C DIFFICILE QUICK SCREEN W PCR REFLEX
C Diff antigen: NEGATIVE
C Diff interpretation: NOT DETECTED
C Diff toxin: NEGATIVE

## 2022-02-24 LAB — BASIC METABOLIC PANEL
Anion gap: 11 (ref 5–15)
BUN: 16 mg/dL (ref 8–23)
CO2: 16 mmol/L — ABNORMAL LOW (ref 22–32)
Calcium: 8.2 mg/dL — ABNORMAL LOW (ref 8.9–10.3)
Chloride: 111 mmol/L (ref 98–111)
Creatinine, Ser: 1.15 mg/dL — ABNORMAL HIGH (ref 0.44–1.00)
GFR, Estimated: 49 mL/min — ABNORMAL LOW (ref >60)
Glucose, Bld: 170 mg/dL — ABNORMAL HIGH (ref 70–99)
Potassium: 3.8 mmol/L (ref 3.5–5.1)
Sodium: 138 mmol/L (ref 135–145)

## 2022-02-24 LAB — URINALYSIS, ROUTINE W REFLEX MICROSCOPIC
Bilirubin Urine: NEGATIVE
Glucose, UA: 150 mg/dL — AB
Ketones, ur: 20 mg/dL — AB
Nitrite: POSITIVE — AB
Protein, ur: 30 mg/dL — AB
Specific Gravity, Urine: 1.018 (ref 1.005–1.030)
WBC, UA: >50 WBC/hpf — ABNORMAL HIGH (ref 0–5)
pH: 5 (ref 5.0–8.0)

## 2022-02-24 LAB — GLUCOSE, CAPILLARY
Glucose-Capillary: 173 mg/dL — ABNORMAL HIGH (ref 70–99)
Glucose-Capillary: 179 mg/dL — ABNORMAL HIGH (ref 70–99)
Glucose-Capillary: 186 mg/dL — ABNORMAL HIGH (ref 70–99)
Glucose-Capillary: 80 mg/dL (ref 70–99)

## 2022-02-24 MED ORDER — GUAIFENESIN-DM 100-10 MG/5ML PO SYRP
5.0000 mL | ORAL_SOLUTION | ORAL | Status: DC | PRN
  Administered 2022-02-24 – 2022-03-04 (×5): 5 mL via ORAL
  Filled 2022-02-24 (×10): qty 10

## 2022-02-24 MED ORDER — SODIUM CHLORIDE 0.9 % IV BOLUS: 1000.0000 mL | Freq: Once | INTRAVENOUS | Status: DC

## 2022-02-24 MED ORDER — SILVER SULFADIAZINE 1 % EX CREA
TOPICAL_CREAM | Freq: Every day | CUTANEOUS | Status: DC
  Filled 2022-02-24: qty 50

## 2022-02-24 MED ORDER — NYSTATIN 100000 UNIT/GM EX POWD
Freq: Two times a day (BID) | CUTANEOUS | Status: DC
  Administered 2022-02-24 – 2022-03-05 (×5): 1 via TOPICAL
  Filled 2022-02-24 (×2): qty 15

## 2022-02-24 MED ORDER — SODIUM CHLORIDE 0.9 % IV BOLUS
500.0000 mL | Freq: Once | INTRAVENOUS | Status: AC
  Administered 2022-02-24: 500 mL via INTRAVENOUS

## 2022-02-24 MED ORDER — LABETALOL HCL 5 MG/ML IV SOLN: 5.0000 mg | INTRAVENOUS | Status: DC | PRN

## 2022-02-24 MED ORDER — SODIUM CHLORIDE 0.9 % IV BOLUS
1000.0000 mL | Freq: Once | INTRAVENOUS | Status: AC
  Administered 2022-02-24: 1000 mL via INTRAVENOUS

## 2022-02-24 NOTE — Progress Notes (Signed)
Progress Note   Patient: Maria Fox HGD:[id number] DOB: 01-03-1946 DOA: 02/23/2022     1 DOS: the patient was seen and examined on 02/24/2022   Brief hospital course: 76 y/o with a past medical history to include hypertension angioedema, hyperlipidemia, history of migraines,  automotive immune deficiency syndrome,  CVA in 2016, s/p post 2 vessel CABG 2016, LHC 2019 with PCI/DES, diabetes since 2014 type II,  chronic diastolic congestive heart failure since 2016,  coronary artery disease since 2016,  acute left PCA stroke in 2017,  COVID-19 infection in 2022,  deep vein thrombosis in 2021,  history of atrial flutter on Eliquis,. Tricuspid regurg,  mitral valve regurg,  right bundle branch block,  chronic kidney disease stage IIIb. Patient never used tobacco product.   She has had a week long illness with N/V and now diarrhea with subsequent decrease PO intake. She has become very weak to the point of not being able to ambulate. Due to her persistent symptoms and weakness she activated EMS. She was transported to [facility] for evaluation.    Pt found to be flu pos  Assessment and Plan: * Dehydration -pt reports no significant PO intake on over one week, in addition to n/v/d  -clinically dry on exam with dry membranes, extremely poor skin turgor, significant skin tenting -Pt tachycardic and lethartic, prompting rapid response this AM -Hemodynamics improved with multiple IVF boluses and increasing basal rate of IVF -cont IVF as tolerated -encourage PO as tolerated   Influenza A Respiratory panel positive for influenza A. Patient endorses rigors and chills at home. No documented fever. Suspect this is cause of N/V/D. Mild rhonchi on exam, no PNA on CXR -Continue tamiflu 75 mg BID x 5 days.    Contact dermatitis Patient reports rash on buttocks/perineum and pubic area developed over the past several days in conjunction with diarrhea. Erythematous macular rash with some bleeding. - Bumex cream BID  to affected area was initially ordered -also ordered nystatin powder to area   Stage 3 chronic kidney disease due to type 2 diabetes mellitus ([name]) Serum creatinine mildly elevated 2/2 dehydration. -continue IVF hydration per above   Type 2 diabetes mellitus with neurological complications ([place]) Last A1C 10/30/21 9.5%. -cont SSI as needed   Hypertension, essential  Continue home cardiac and antihypertensive meds   Atrial flutter with rapid ventricular response ([place]) Continue home meds including Eliquis Tachycardic this AM, see Rapid response documentation. Focused on treating dehydration more aggressively with subsequent normalization in HR   GERD (gastroesophageal reflux disease) No active c/o Continue PPI   Chronic diastolic CHF (congestive heart failure) ([place]) Clinically dehydrated Requiring aggressive IVF hydration. Hold lasix -Will repeat 2d echo      Subjective: Lethargic this AM, but arousable. Does report feeling thirsty  Physical Exam: Vitals:   02/24/22 0811 02/24/22 0925 02/24/22 1118 02/24/22 1232  BP: 108/70  125/73 131/68  Pulse: (!) 164 (!) 161 88 79  Resp: '16  20 20  '$ Temp: 97.7 F (36.5 C)  98 F (36.7 C) 98.3 F (36.8 C)  TempSrc: Oral  Oral Oral  SpO2: 95% 98% 98% 99%  Weight:      Height:       General exam: Asleep, arousable, laying in bed, in nad Respiratory system: Normal respiratory effort, no wheezing Cardiovascular system: tachycardic, s1, s2 Gastrointestinal system: Soft, nondistended, positive BS Central nervous system: CN2-12 grossly intact, strength intact Extremities: Perfused, no clubbing Skin: poor skin turgor, significant tenting Psychiatry: Mood normal //  no visual hallucinations   Data Reviewed:  Labs reviewed: na 138, K 3.8, Cr 1.15   Family Communication: Pt in room, family not at bedside  Disposition: Status is: Inpatient Remains inpatient appropriate because: Severity of illness  Planned Discharge Destination:  Home    Author: Marylu Lund, MD 02/24/2022 2:42 PM  For on call review [email or web address].

## 2022-02-24 NOTE — Hospital Course (Addendum)
PMH of HTN, CAD SP CABG, CVA, type II DM, HLD, PAD, CKD, NSVT, DVT, neuropathy presented to the hospital with complaints of nausea vomiting diarrhea and influenza infection

## 2022-02-24 NOTE — Plan of Care (Signed)
  Problem: Safety: Goal: Ability to remain free from injury will improve Outcome: Progressing   Problem: Pain Managment: Goal: General experience of comfort will improve Outcome: Progressing   Problem: Elimination: Goal: Will not experience complications related to urinary retention Outcome: Progressing   Problem: Clinical Measurements: Goal: Ability to maintain clinical measurements within normal limits will improve Outcome: Progressing   Problem: Education: Goal: Knowledge of General Education information will improve Description: Including pain rating scale, medication(s)/side effects and non-pharmacologic comfort measures Outcome: Progressing

## 2022-02-24 NOTE — Significant Event (Signed)
Rapid Response Event Note   Reason for Call :  Rapid HR  Initial Focused Assessment:  Patient lethargic, is alert and oriented x 4. HR 150-170. Patient denies chest pain at this time.      Interventions:  EKG Bolus NS Monitor Tele  PRN Meds     MD Notified: Dr. Wyline Copas Call Time: 0920 Arrival Time: [place] Dezzie Badilla, RN

## 2022-02-24 NOTE — Plan of Care (Signed)

## 2022-02-25 ENCOUNTER — Inpatient Hospital Stay (HOSPITAL_COMMUNITY): Payer: Medicare Other

## 2022-02-25 DIAGNOSIS — I5032 Chronic diastolic (congestive) heart failure: Secondary | ICD-10-CM | POA: Diagnosis not present

## 2022-02-25 DIAGNOSIS — E860 Dehydration: Secondary | ICD-10-CM | POA: Diagnosis not present

## 2022-02-25 DIAGNOSIS — R110 Nausea: Secondary | ICD-10-CM | POA: Diagnosis not present

## 2022-02-25 DIAGNOSIS — R197 Diarrhea, unspecified: Secondary | ICD-10-CM | POA: Diagnosis not present

## 2022-02-25 LAB — ECHOCARDIOGRAM COMPLETE
Area-P 1/2: 3.42 cm2
Calc EF: 62.4 %
Height: 62 in
MV M vel: 5.12 m/s
MV Peak grad: 104.9 mmHg
S' Lateral: 2.8 cm
Single Plane A2C EF: 61.1 %
Single Plane A4C EF: 62.9 %
Weight: 2190.49 oz

## 2022-02-25 LAB — CBC
HCT: 33.3 % — ABNORMAL LOW (ref 36.0–46.0)
Hemoglobin: 10.7 g/dL — ABNORMAL LOW (ref 12.0–15.0)
MCH: 29.6 pg (ref 26.0–34.0)
MCHC: 32.1 g/dL (ref 30.0–36.0)
MCV: 92.2 fL (ref 80.0–100.0)
Platelets: 143 10*3/uL — ABNORMAL LOW (ref 150–400)
RBC: 3.61 MIL/uL — ABNORMAL LOW (ref 3.87–5.11)
RDW: 13.8 % (ref 11.5–15.5)
WBC: 4.6 10*3/uL (ref 4.0–10.5)
nRBC: 0 % (ref 0.0–0.2)

## 2022-02-25 LAB — COMPREHENSIVE METABOLIC PANEL
ALT: 13 U/L (ref 0–44)
AST: 16 U/L (ref 15–41)
Albumin: 2.7 g/dL — ABNORMAL LOW (ref 3.5–5.0)
Alkaline Phosphatase: 44 U/L (ref 38–126)
Anion gap: 4 — ABNORMAL LOW (ref 5–15)
BUN: 8 mg/dL (ref 8–23)
CO2: 19 mmol/L — ABNORMAL LOW (ref 22–32)
Calcium: 7.5 mg/dL — ABNORMAL LOW (ref 8.9–10.3)
Chloride: 116 mmol/L — ABNORMAL HIGH (ref 98–111)
Creatinine, Ser: 1.07 mg/dL — ABNORMAL HIGH (ref 0.44–1.00)
GFR, Estimated: 53 mL/min — ABNORMAL LOW (ref >60)
Glucose, Bld: 126 mg/dL — ABNORMAL HIGH (ref 70–99)
Potassium: 3.4 mmol/L — ABNORMAL LOW (ref 3.5–5.1)
Sodium: 139 mmol/L (ref 135–145)
Total Bilirubin: 0.7 mg/dL (ref 0.3–1.2)
Total Protein: 5.1 g/dL — ABNORMAL LOW (ref 6.5–8.1)

## 2022-02-25 LAB — GLUCOSE, CAPILLARY
Glucose-Capillary: 132 mg/dL — ABNORMAL HIGH (ref 70–99)
Glucose-Capillary: 172 mg/dL — ABNORMAL HIGH (ref 70–99)
Glucose-Capillary: 197 mg/dL — ABNORMAL HIGH (ref 70–99)
Glucose-Capillary: 144 mg/dL — ABNORMAL HIGH (ref 70–99)
Glucose-Capillary: 148 mg/dL — ABNORMAL HIGH (ref 70–99)

## 2022-02-25 LAB — MAGNESIUM: Magnesium: 1.2 mg/dL — ABNORMAL LOW (ref 1.7–2.4)

## 2022-02-25 MED ORDER — BENZONATATE 100 MG PO CAPS
100.0000 mg | ORAL_CAPSULE | Freq: Three times a day (TID) | ORAL | Status: DC | PRN
  Administered 2022-03-01 – 2022-03-03 (×4): 100 mg via ORAL
  Filled 2022-02-25 (×4): qty 1

## 2022-02-25 MED ORDER — PROCHLORPERAZINE EDISYLATE 10 MG/2ML IJ SOLN
10.0000 mg | Freq: Four times a day (QID) | INTRAMUSCULAR | Status: DC | PRN
  Administered 2022-02-25 – 2022-03-07 (×13): 10 mg via INTRAVENOUS
  Filled 2022-02-25 (×13): qty 2

## 2022-02-25 MED ORDER — MAGNESIUM SULFATE 4 GM/100ML IV SOLN
4.0000 g | Freq: Once | INTRAVENOUS | Status: AC
  Administered 2022-02-25: 4 g via INTRAVENOUS
  Filled 2022-02-25: qty 100

## 2022-02-25 MED ORDER — POTASSIUM CHLORIDE CRYS ER 20 MEQ PO TBCR
60.0000 meq | EXTENDED_RELEASE_TABLET | Freq: Once | ORAL | Status: AC
  Administered 2022-02-25: 60 meq via ORAL
  Filled 2022-02-25: qty 3

## 2022-02-25 NOTE — Progress Notes (Addendum)
Pt cont to co of nausea, vomited 100 cc's greenish. C/o's of HA and pain in mid/upper abd, states is a "strange feeling" and unable to describe. 12 lead EKG done, awaiting return call from MD

## 2022-02-25 NOTE — Progress Notes (Signed)
  Transition of Care [facility]) Screening Note   Patient Details  Name: Maria Fox Date of Birth: [birth year]   Transition of Care [facility]) CM/SW Contact:    Lennart Pall, LCSW Phone Number: 02/25/2022, 12:08 PM    Transition of Care Department [facility]) has reviewed patient and no TOC needs have been identified at this time. We will continue to monitor patient advancement through interdisciplinary progression rounds. If new patient transition needs arise, please place a TOC consult.

## 2022-02-25 NOTE — Progress Notes (Signed)
  Echocardiogram 2D Echocardiogram has been performed.  Maria Fox 02/25/2022, 2:16 PM

## 2022-02-25 NOTE — Progress Notes (Addendum)
Notified by central tele that pt has had a 15 beat run of Vtach - VS cked, pt A and O X 4, skin pale, dry, c/o's of HA and nausea. MD stat paged

## 2022-02-25 NOTE — Progress Notes (Signed)
OT Cancellation Note  Patient Details Name: Maria Fox MRN: [id number] DOB: [birth year]   Cancelled Treatment:    Reason Eval/Treat Not Completed: Other (comment) Patient just settled in bed with recent Flexiseal placement. Patient asking for therapy to check back on 12/19.  Rennie Plowman, MS Acute Rehabilitation Department Office# [phone]  02/25/2022, 3:22 PM

## 2022-02-25 NOTE — Progress Notes (Signed)
Progress Note   Patient: Maria Fox:[id number] DOB: [birth year] DOA: 02/23/2022     2 DOS: the patient was seen and examined on 02/25/2022   Brief hospital course: 76 y/o with a past medical history to include hypertension angioedema, hyperlipidemia, history of migraines,  automotive immune deficiency syndrome,  CVA in 2016, s/p post 2 vessel CABG 2016, LHC 2019 with PCI/DES, diabetes since 2014 type II,  chronic diastolic congestive heart failure since 2016,  coronary artery disease since 2016,  acute left PCA stroke in 2017,  COVID-19 infection in 2022,  deep vein thrombosis in 2021,  history of atrial flutter on Eliquis,. Tricuspid regurg,  mitral valve regurg,  right bundle branch block,  chronic kidney disease stage IIIb. Patient never used tobacco product.   She has had a week long illness with N/V and now diarrhea with subsequent decrease PO intake. She has become very weak to the point of not being able to ambulate. Due to her persistent symptoms and weakness she activated EMS. She was transported to [facility] for evaluation.    Pt found to be flu pos  Assessment and Plan: * Dehydration -pt reports no significant PO intake on over one week, in addition to n/v/d  -initially clinically dry on exam with dry membranes, extremely poor skin turgor, significant skin tenting -Initially with tachycardia which improved with IVF resuscitation  -cont IVF as tolerated -encourage PO as tolerated -Pt reports feeling better today   Influenza A Respiratory panel positive for influenza A. Patient endorses rigors and chills at home. No documented fever. Suspect this is cause of N/V/D. Mild rhonchi on exam, no PNA on CXR -Continue tamiflu 75 mg BID x 5 days.    Contact dermatitis Patient reports rash on buttocks/perineum and pubic area developed over the past several days in conjunction with diarrhea. Erythematous macular rash with some bleeding. - Bumex cream BID to affected area was initially  ordered -also ordered nystatin powder to area as well as silvadene    Stage 3 chronic kidney disease due to type 2 diabetes mellitus ([name]) Serum creatinine mildly elevated 2/2 dehydration. -continue IVF hydration per above -recheck bmet in AM   Type 2 diabetes mellitus with neurological complications ([place]) Last A1C 10/30/21 9.5%. -cont SSI as needed   Hypertension, essential  Continue home cardiac and antihypertensive meds   Atrial flutter with rapid ventricular response ([place]) Continue home meds including Eliquis Tachycardic on the AM of 12/17, see Rapid response documentation. Focused on treating dehydration more aggressively with subsequent normalization in HR -cont home metoprolol as tolerated   GERD (gastroesophageal reflux disease) No active c/o Continue PPI   Chronic diastolic CHF (congestive heart failure) ([place]) Clinically dehydrated Requiring aggressive IVF hydration. Hold lasix for now -repeat 2d echo performed, pending results      Subjective: Lethargic this AM, but arousable. Does report feeling thirsty  Physical Exam: Vitals:   02/24/22 2042 02/25/22 0447 02/25/22 0451 02/25/22 1425  BP: 137/69 129/70  (!) 142/126  Pulse: 81 77  72  Resp: '14 16  18  '$ Temp: 97.6 F (36.4 C) 99.6 F (37.6 C)  99 F (37.2 C)  TempSrc: Oral Oral  Oral  SpO2: 98% 96%  99%  Weight:   62.1 kg   Height:       General exam: Awake, laying in bed, in nad Respiratory system: Normal respiratory effort, no wheezing Cardiovascular system: regular rate, s1, s2 Gastrointestinal system: Soft, nondistended, positive BS Central nervous system: CN2-12 grossly intact, strength  intact Extremities: Perfused, no clubbing Skin: Normal skin turgor, no notable skin lesions seen Psychiatry: Mood normal // no visual hallucinations   Data Reviewed:  Labs reviewed: na 139, K 3.4, Cr 1.07   Family Communication: Pt in room, family not at bedside  Disposition: Status is: Inpatient Remains  inpatient appropriate because: Severity of illness  Planned Discharge Destination: Home    Author: Marylu Lund, MD 02/25/2022 3:46 PM  For on call review [email or web address].

## 2022-02-25 NOTE — Evaluation (Signed)
Physical Therapy Evaluation Patient Details Name: Maria Fox MRN: [id number] DOB: Dec 09, 1945 Today's Date: 02/25/2022  History of Present Illness  76 yo female admitted with dehydration, weakness, flu (+). Hx of CAD, CABG, DM, neuropathy, COVID, CHF, angioedema, migraines, CVA, Afib/Aflutter, CKD  Clinical Impression  On eval, pt was Min A for mobility. Found bed linens to be soiled with urine. She briefly stood at bedside with supportive assistance from therapist while bed linens were being changed by NT. While standing, flexiseal became dislodged and pt had a bladder incontinence occurrence. After being cleaned up, pt was assisted back to bed. Pt c/o nausea and headache during session. Made RN aware of events of session and patient's requests. Will plan to follow and progress activity as tolerated. Hopefully patient will be able to mobilize with the mobility team as well to increase activity.      Recommendations for follow up therapy are one component of a multi-disciplinary discharge planning process, led by the attending physician.  Recommendations may be updated based on patient status, additional functional criteria and insurance authorization.  Follow Up Recommendations Home health PT      Assistance Recommended at Discharge Frequent or constant Supervision/Assistance  Patient can return home with the following  A little help with walking and/or transfers;A little help with bathing/dressing/bathroom    Equipment Recommendations None recommended by PT  Recommendations for Other Services  OT consult    Functional Status Assessment Patient has had a recent decline in their functional status and demonstrates the ability to make significant improvements in function in a reasonable and predictable amount of time.     Precautions / Restrictions Precautions Precautions: Fall Precaution Comments: incontinent (bladder, bowel); dizziness Restrictions Weight Bearing Restrictions: No       Mobility  Bed Mobility Overal bed mobility: Needs Assistance Bed Mobility: Rolling, Sidelying to Sit, Sit to Supine Rolling: Min assist Sidelying to sit: Min assist   Sit to supine: Min assist   General bed mobility comments: Assist for trunk, LEs, manage lines. Increased time. Cues for safety, technique. Pt c/o dizziness.    Transfers Overall transfer level: Needs assistance Equipment used: 1 person hand held assist Transfers: Sit to/from Stand Sit to Stand: Min assist           General transfer comment: Assist to rise, steady, control descent.    Ambulation/Gait Ambulation/Gait assistance: Min assist   Assistive device: 1 person hand held assist Gait Pattern/deviations: Step-through pattern, Decreased stride length       General Gait Details: Side steps along bedside with 1 HHA and pt holding on to IV pole. Assist to steady pt. Fatigues easily.  Stairs            Wheelchair Mobility    Modified Rankin (Stroke Patients Only)       Balance Overall balance assessment: Needs assistance         Standing balance support: Bilateral upper extremity supported, During functional activity Standing balance-Leahy Scale: Poor                               Pertinent Vitals/Pain Pain Assessment Pain Assessment: 0-10 Pain Score: 6  Pain Location: headache Pain Descriptors / Indicators: Aching Pain Intervention(s): Limited activity within patient's tolerance, Patient requesting pain meds-RN notified    Home Living Family/patient expects to be discharged to:: Private residence Living Arrangements: Spouse/significant other Available Help at Discharge: Family Type of Home: House Home Access:  Stairs to enter Entrance Stairs-Rails: Psychiatric nurse of Steps: 2   Home Layout: One level Home Equipment: Conservation officer, nature (2 wheels)      Prior Function Prior Level of Function : Independent/Modified Independent                      Hand Dominance        Extremity/Trunk Assessment   Upper Extremity Assessment Upper Extremity Assessment: Defer to OT evaluation    Lower Extremity Assessment Lower Extremity Assessment: Generalized weakness    Cervical / Trunk Assessment Cervical / Trunk Assessment: Normal  Communication   Communication: No difficulties  Cognition Arousal/Alertness: Awake/alert Behavior During Therapy: WFL for tasks assessed/performed Overall Cognitive Status: Within Functional Limits for tasks assessed                                          General Comments      Exercises     Assessment/Plan    PT Assessment Patient needs continued PT services  PT Problem List Decreased strength;Decreased activity tolerance;Decreased balance;Decreased mobility       PT Treatment Interventions DME instruction;Gait training;Therapeutic exercise;Balance training;Functional mobility training;Therapeutic activities;Patient/family education    PT Goals (Current goals can be found in the Care Plan section)  Acute Rehab PT Goals Patient Stated Goal: to feel better PT Goal Formulation: With patient Time For Goal Achievement: 03/11/22    Frequency Min 3X/week     Co-evaluation               AM-PAC PT "6 Clicks" Mobility  Outcome Measure Help needed turning from your back to your side while in a flat bed without using bedrails?: A Little Help needed moving from lying on your back to sitting on the side of a flat bed without using bedrails?: A Little Help needed moving to and from a bed to a chair (including a wheelchair)?: A Lot Help needed standing up from a chair using your arms (e.g., wheelchair or bedside chair)?: A Little Help needed to walk in hospital room?: A Lot Help needed climbing 3-5 steps with a railing? : A Lot 6 Click Score: 15    End of Session Equipment Utilized During Treatment: Gait belt Activity Tolerance: Patient limited by  fatigue Patient left: in bed;with call bell/phone within reach;with bed alarm set   PT Visit Diagnosis: Muscle weakness (generalized) (M62.81);Difficulty in walking, not elsewhere classified (R26.2)    Time: 1937-9024 PT Time Calculation (min) (ACUTE ONLY): 50 min   Charges:   PT Evaluation $PT Eval Moderate Complexity: 1 Mod PT Treatments $Therapeutic Activity: 23-37 mins         Doreatha Massed, PT Acute Rehabilitation  Office: [phone]

## 2022-02-25 NOTE — Significant Event (Signed)
Rapid Response Event Note   Reason for Call :  15 beat run V tach, emesis   Initial Focused Assessment:  Patient resting in bed with wash cloth draped over eyes. Responds to voice. Alert and oriented x3. When asked what year it was responded 1923, but when staff corrected her she immediately states the correct year. Explained she is in hospital for flu. Per patient, she had a knot like feeling in her throat a few minutes prior to rapid response and eventually vomited and felt relieved.   15 beat VT- MD aware, magnesium level ordered, K this AM 3.9 and received 60 mEq PO. On assessment rate controlled (Hx A flutter) - HR 78-83. BP 169/91. EKG completed prior to arrival and in chart.   SpO2 99% on 2 L [place]. No respiratory distress noted at this time.    Interventions:  Check Magnesium level, Compazine for nausea.   Event Summary:   MD Notified: Wyline Copas MD- by bedside RN Call Time: 8088 Arrival Time: 1103 End Time: 1740  Josph Macho, RN

## 2022-02-26 DIAGNOSIS — R197 Diarrhea, unspecified: Secondary | ICD-10-CM | POA: Diagnosis not present

## 2022-02-26 DIAGNOSIS — R110 Nausea: Secondary | ICD-10-CM | POA: Diagnosis not present

## 2022-02-26 DIAGNOSIS — E860 Dehydration: Secondary | ICD-10-CM | POA: Diagnosis not present

## 2022-02-26 LAB — COMPREHENSIVE METABOLIC PANEL
ALT: 13 U/L (ref 0–44)
AST: 22 U/L (ref 15–41)
Albumin: 2.8 g/dL — ABNORMAL LOW (ref 3.5–5.0)
Alkaline Phosphatase: 49 U/L (ref 38–126)
Anion gap: 6 (ref 5–15)
BUN: 5 mg/dL — ABNORMAL LOW (ref 8–23)
CO2: 19 mmol/L — ABNORMAL LOW (ref 22–32)
Calcium: 8 mg/dL — ABNORMAL LOW (ref 8.9–10.3)
Chloride: 115 mmol/L — ABNORMAL HIGH (ref 98–111)
Creatinine, Ser: 0.9 mg/dL (ref 0.44–1.00)
GFR, Estimated: >60 mL/min (ref >60)
Glucose, Bld: 136 mg/dL — ABNORMAL HIGH (ref 70–99)
Potassium: 4 mmol/L (ref 3.5–5.1)
Sodium: 140 mmol/L (ref 135–145)
Total Bilirubin: 0.6 mg/dL (ref 0.3–1.2)
Total Protein: 5.5 g/dL — ABNORMAL LOW (ref 6.5–8.1)

## 2022-02-26 LAB — GLUCOSE, CAPILLARY
Glucose-Capillary: 155 mg/dL — ABNORMAL HIGH (ref 70–99)
Glucose-Capillary: 172 mg/dL — ABNORMAL HIGH (ref 70–99)
Glucose-Capillary: 131 mg/dL — ABNORMAL HIGH (ref 70–99)
Glucose-Capillary: 129 mg/dL — ABNORMAL HIGH (ref 70–99)

## 2022-02-26 LAB — HEMOGLOBIN A1C
Hgb A1c MFr Bld: 9.2 % — ABNORMAL HIGH (ref 4.8–5.6)
Mean Plasma Glucose: 217 mg/dL

## 2022-02-26 LAB — GASTROINTESTINAL PANEL BY PCR, STOOL (REPLACES STOOL CULTURE)

## 2022-02-26 LAB — CBC
HCT: 35.9 % — ABNORMAL LOW (ref 36.0–46.0)
Hemoglobin: 11.6 g/dL — ABNORMAL LOW (ref 12.0–15.0)
MCH: 29.7 pg (ref 26.0–34.0)
MCHC: 32.3 g/dL (ref 30.0–36.0)
MCV: 91.8 fL (ref 80.0–100.0)
Platelets: 164 10*3/uL (ref 150–400)
RBC: 3.91 MIL/uL (ref 3.87–5.11)
RDW: 13.6 % (ref 11.5–15.5)
WBC: 5.1 10*3/uL (ref 4.0–10.5)
nRBC: 0 % (ref 0.0–0.2)

## 2022-02-26 LAB — MAGNESIUM: Magnesium: 2.5 mg/dL — ABNORMAL HIGH (ref 1.7–2.4)

## 2022-02-26 MED ORDER — SODIUM CHLORIDE 0.9 % IV SOLN
2.0000 g | INTRAVENOUS | Status: DC
  Administered 2022-02-26 – 2022-02-28 (×3): 2 g via INTRAVENOUS
  Filled 2022-02-26 (×3): qty 20

## 2022-02-26 NOTE — Progress Notes (Signed)
Progress Note   Patient: Maria Fox JJH:[id number] DOB: 06-29-1945 DOA: 02/23/2022     3 DOS: the patient was seen and examined on 02/26/2022   Brief hospital course: 76 y/o with a past medical history to include hypertension angioedema, hyperlipidemia, history of migraines,  automotive immune deficiency syndrome,  CVA in 2016, s/p post 2 vessel CABG 2016, LHC 2019 with PCI/DES, diabetes since 2014 type II,  chronic diastolic congestive heart failure since 2016,  coronary artery disease since 2016,  acute left PCA stroke in 2017,  COVID-19 infection in 2022,  deep vein thrombosis in 2021,  history of atrial flutter on Eliquis,. Tricuspid regurg,  mitral valve regurg,  right bundle branch block,  chronic kidney disease stage IIIb. Patient never used tobacco product.   She has had a week long illness with N/V and now diarrhea with subsequent decrease PO intake. She has become very weak to the point of not being able to ambulate. Due to her persistent symptoms and weakness she activated EMS. She was transported to [facility] for evaluation.    Pt found to be flu pos  Assessment and Plan: * Dehydration -pt reports no significant PO intake on over one week, in addition to n/v/d  -initially clinically dry on exam with dry membranes, extremely poor skin turgor, significant skin tenting -Initially with tachycardia which improved with IVF resuscitation  -cont IVF as tolerated -encourage PO as tolerated -Pt reports feeling better today   Influenza A Respiratory panel positive for influenza A. Patient endorses rigors and chills at home. No documented fever. Suspect this is cause of N/V/D. Mild rhonchi on exam, no PNA on CXR -Continue tamiflu 75 mg BID x 5 days.    Contact dermatitis Patient reports rash on buttocks/perineum and pubic area developed over the past several days in conjunction with diarrhea. Erythematous macular rash with some bleeding. - Bumex cream BID to affected area was initially  ordered -also ordered nystatin powder to area as well as silvadene    Stage 3 chronic kidney disease due to type 2 diabetes mellitus ([place]) -continue IVF hydration per above -Cr has normalized -Recheck bmet in AM   Type 2 diabetes mellitus with neurological complications ([place]) Last A1C 10/30/21 9.5%. -cont SSI as needed   Hypertension, essential  Continue home cardiac and antihypertensive meds   Atrial flutter with rapid ventricular response ([place]) Continue home meds including Eliquis Tachycardic on the AM of 12/17, see Rapid response documentation. HR normalized after IVFresuscitation -cont home metoprolol as tolerated   GERD (gastroesophageal reflux disease) No active c/o Continue PPI   Chronic diastolic CHF (congestive heart failure) ([place]) Clinically dehydrated Requiring aggressive IVF hydration. Hold lasix for now -repeat 2d echo reviewed, EF 60-65% with no wall motion abnormalities  UTI -Pt complaining of dysuria -Recent UA reviewed, suggestive of UTI -Will start empiric rocephin -follow urine cx   Hypomagnesemia -Replaced      Subjective: Feeling somewhat better, however is complaining of dysuria and continued nausea  Physical Exam: Vitals:   02/26/22 0125 02/26/22 0500 02/26/22 0514 02/26/22 0746  BP: (!) 166/89  (!) 158/79 (!) 169/89  Pulse: 76  73 67  Resp: '15  18 18  '$ Temp: 97.8 F (36.6 C)  97.9 F (36.6 C) 98.1 F (36.7 C)  TempSrc: Oral  Oral Oral  SpO2: 99%  100% 100%  Weight:  64.7 kg    Height:       General exam: Conversant, in no acute distress Respiratory system: normal chest rise,  clear, no audible wheezing Cardiovascular system: regular rhythm, s1-s2 Gastrointestinal system: Nondistended, nontender, pos BS Central nervous system: No seizures, no tremors Extremities: No cyanosis, no joint deformities Skin: No rashes, no pallor Psychiatry: Affect normal // no auditory hallucinations   Data Reviewed:  Labs reviewed: na 140, K 4.0,  Cr0.90   Family Communication: Pt in room, family not at bedside  Disposition: Status is: Inpatient Remains inpatient appropriate because: Severity of illness  Planned Discharge Destination: Home    Author: Marylu Lund, MD 02/26/2022 1:51 PM  For on call review [email or web address]

## 2022-02-26 NOTE — Evaluation (Signed)
Occupational Therapy Evaluation Patient Details Name: Maria Fox MRN: [id number] DOB: Nov 14, 1945 Today's Date: 02/26/2022   History of Present Illness Patient is a 76 year old female who presented with a week history of nausea and vomiting with diarrhea. Patient was admitted with influenza A, dehydration, contact dermatitis. PMH: CKD II, DM, CVA 2016, CABG, L PCA stroke 2017, COVID 19, RBBB, autoimmune deficiency syndrome.   Clinical Impression   Patient is a 76 year old female who was admitted for above. Patient was living at home  independently with spouse. Patient was limited to bed level today with increased pain and fatigue with bottom. Patient was noted to have decreased functional activity tolerance, decreased endurance, decreased standing balance, decreased safety awareness, and decreased knowledge of AD/AE impacting participation in ADLs. Patient would continue to benefit from skilled OT services at this time while admitted and after d/c to address noted deficits in order to improve overall safety and independence in ADLs.        Recommendations for follow up therapy are one component of a multi-disciplinary discharge planning process, led by the attending physician.  Recommendations may be updated based on patient status, additional functional criteria and insurance authorization.   Follow Up Recommendations  Home health OT     Assistance Recommended at Discharge Frequent or constant Supervision/Assistance  Patient can return home with the following A little help with walking and/or transfers;A little help with bathing/dressing/bathroom;Direct supervision/assist for financial management;Help with stairs or ramp for entrance;Assist for transportation;Direct supervision/assist for medications management;Assistance with cooking/housework    Functional Status Assessment  Patient has had a recent decline in their functional status and demonstrates the ability to make significant  improvements in function in a reasonable and predictable amount of time.  Equipment Recommendations  None recommended by OT    Recommendations for Other Services       Precautions / Restrictions Precautions Precautions: Fall Precaution Comments: incontinent (bladder, bowel); dizziness Restrictions Weight Bearing Restrictions: No      Mobility Bed Mobility Overal bed mobility: Needs Assistance   Rolling: Min assist         General bed mobility comments: patient declined to sit up on EOB with reports of increased pain in bottom from rectal tube and skin breakdown.         ADL either performed or assessed with clinical judgement   ADL Overall ADL's : Needs assistance/impaired Eating/Feeding: Supervision/ safety;Bed level   Grooming: Set up;Sitting   Upper Body Bathing: Minimal assistance;Bed level   Lower Body Bathing: Maximal assistance;Bed level   Upper Body Dressing : Minimal assistance;Bed level   Lower Body Dressing: Maximal assistance;Bed level     Toilet Transfer Details (indicate cue type and reason): declined to transfer OOB at this time with inreased pain in bottom and redness of perineal area skin Toileting- Clothing Manipulation and Hygiene: Total assistance;Bed level Toileting - Clothing Manipulation Details (indicate cue type and reason): min A rolling side to side increased loose stool with rectal tube in place. patient nurse in room to assist and assess tube.       General ADL Comments: patient was educated on the importance of sidelying either side to reduce pressure on bottom. patient verbalized understanding.     Vision   Vision Assessment?: No apparent visual deficits            Pertinent Vitals/Pain Pain Assessment Pain Assessment: Faces Faces Pain Scale: Hurts even more Pain Location: flexiseal Pain Descriptors / Indicators: Grimacing, Pressure Pain Intervention(s):  Limited activity within patient's tolerance, Monitored during  session, Other (comment) (nurse in room to adjust it)     Hand Dominance Left   Extremity/Trunk Assessment Upper Extremity Assessment Upper Extremity Assessment: Overall WFL for tasks assessed   Lower Extremity Assessment Lower Extremity Assessment: Defer to PT evaluation   Cervical / Trunk Assessment Cervical / Trunk Assessment: Normal   Communication Communication Communication: No difficulties   Cognition Arousal/Alertness: Awake/alert Behavior During Therapy: WFL for tasks assessed/performed Overall Cognitive Status: Within Functional Limits for tasks assessed                      Home Living Family/patient expects to be discharged to:: Private residence Living Arrangements: Spouse/significant other Available Help at Discharge: Family Type of Home: House Home Access: Stairs to enter Technical brewer of Steps: 2 Entrance Stairs-Rails: Right;Left Home Layout: One level               Home Equipment: Conservation officer, nature (2 wheels)          Prior Functioning/Environment Prior Level of Function : Independent/Modified Independent                OT Problem List: Decreased activity tolerance;Impaired balance (sitting and/or standing);Decreased safety awareness;Decreased knowledge of precautions;Pain      OT Treatment/Interventions: Self-care/ADL training;Energy conservation;Therapeutic exercise;DME and/or AE instruction;Therapeutic activities;Patient/family education    OT Goals(Current goals can be found in the care plan section) Acute Rehab OT Goals Patient Stated Goal: to get stronger OT Goal Formulation: With patient Time For Goal Achievement: 03/12/22 Potential to Achieve Goals: Fair  OT Frequency: Min 2X/week       AM-PAC OT "6 Clicks" Daily Activity     Outcome Measure Help from another person eating meals?: None Help from another person taking care of personal grooming?: A Little Help from another person toileting, which includes using  toliet, bedpan, or urinal?: A Lot Help from another person bathing (including washing, rinsing, drying)?: A Lot Help from another person to put on and taking off regular upper body clothing?: A Little Help from another person to put on and taking off regular lower body clothing?: A Lot 6 Click Score: 16   End of Session Nurse Communication: Mobility status  Activity Tolerance: Patient limited by pain Patient left: in bed;with call bell/phone within reach;with nursing/sitter in room  OT Visit Diagnosis: Pain                Time: 5329-9242 OT Time Calculation (min): 20 min Charges:  OT General Charges $OT Visit: 1 Visit OT Evaluation $OT Eval Moderate Complexity: 1 Mod  Jahyra Sukup OTR/L, MS Acute Rehabilitation Department Office# [phone]   Willa Rough 02/26/2022, 12:15 PM

## 2022-02-26 NOTE — Plan of Care (Addendum)
  Problem: Education: Goal: Knowledge of General Education information will improve Description: Including pain rating scale, medication(s)/side effects and non-pharmacologic comfort measures Outcome: Progressing   Problem: Pain Managment: Goal: General experience of comfort will improve Outcome: Progressing   Problem: Safety: Goal: Ability to remain free from injury will improve Outcome: Progressing   

## 2022-02-26 NOTE — TOC Initial Note (Signed)
Transition of Care [facility]) - Initial/Assessment Note   Patient Details  Name: Maria Fox MRN: [id number] Date of Birth: 11/22/1945  [facility]) CM/SW Contact:    Sherie Don, LCSW Phone Number: 02/26/2022, 12:51 PM  Clinical Narrative: PT and OT evaluations recommended HH. CSW spoke with patient regarding recommendations. Patient is declining home health at this time and the plan is for patient to return home.  Expected Discharge Plan: Home/Self Care Barriers to Discharge: Continued Medical Work up  Patient Goals and CMS Choice Patient states their goals for this hospitalization and ongoing recovery are:: Return home Choice offered to / list presented to : NA  Expected Discharge Plan and Services Expected Discharge Plan: Home/Self Care In-house Referral: Clinical Social Work Post Acute Care Choice: NA Living arrangements for the past 2 months: [place]           DME Arranged: N/A DME Agency: NA  Prior Living Arrangements/Services Living arrangements for the past 2 months: [place] Lives with:: Significant Other Patient language and need for interpreter reviewed:: Yes Do you feel safe going back to the place where you live?: Yes      Need for Family Participation in Patient Care: No (Comment) Care giver support system in place?: Yes (comment) Criminal Activity/Legal Involvement Pertinent to Current Situation/Hospitalization: No - Comment as needed  Activities of Daily Living Home Assistive Devices/Equipment: Walker (specify type) (four wheel) ADL Screening (condition at time of admission) Patient's cognitive ability adequate to safely complete daily activities?: Yes Is the patient deaf or have difficulty hearing?: No Does the patient have difficulty seeing, even when wearing glasses/contacts?: No Does the patient have difficulty concentrating, remembering, or making decisions?: No Patient able to express need for assistance with ADLs?:  Yes Does the patient have difficulty dressing or bathing?: No Independently performs ADLs?: Yes (appropriate for developmental age) Does the patient have difficulty walking or climbing stairs?: Yes Weakness of Legs: Both Weakness of Arms/Hands: None  Emotional Assessment Attitude/Demeanor/Rapport: Engaged Affect (typically observed): Appropriate Orientation: : Oriented to Self, Oriented to Place, Oriented to  Time, Oriented to Situation Alcohol / Substance Use: Not Applicable  Admission diagnosis:  Dehydration [E86.0] Nausea [R11.0] Dehydration, moderate [E86.0] Diarrhea, unspecified type [R19.7] Patient Active Problem List   Diagnosis Date Noted   Dehydration, moderate 02/23/2022   Influenza A 02/23/2022   POAG (primary open-angle glaucoma) 01/04/2022   Memory deficit 11/07/2021   Functional abdominal pain syndrome 07/29/2020   Atypical chest pain 07/04/2020   Low-tension glaucoma, bilateral, severe stage 03/24/2020   Dysuria 11/08/2019   Right sided temporal headache 08/06/2019   Atrial flutter with rapid ventricular response ([place])    Acute deep vein thrombosis (DVT) of left tibial vein ([place]) 07/11/2019   Acute cholecystitis 07/07/2019   Pain of right breast 03/15/2019   Health maintenance examination 12/11/2018   Type 2 diabetes mellitus with mild nonproliferative diabetic retinopathy without macular edema, bilateral ([place]) 07/16/2018   Coronary artery disease of native artery of native heart with stable angina pectoris ([place])    Effort angina 11/12/2017   Abnormal stress ECG    Dyspnea on exertion 11/04/2017   GERD (gastroesophageal reflux disease) 08/15/2017   Trigger finger 06/09/2017   Nocturnal leg movements 03/25/2017   Osteopenia 02/01/2017   Advanced care planning/counseling discussion 01/02/2017   Stage 3 chronic kidney disease due to type 2 diabetes mellitus ([place]) 08/26/2016   Insomnia 08/26/2016   Vitamin B12 deficiency 11/10/2015   Epistaxis 07/07/2015  Fatty liver disease, nonalcoholic 98/92/1194   Chronic diastolic CHF (congestive heart failure) ([place]) 08/27/2014   Positive hepatitis C antibody test 08/27/2014   Iron deficiency 08/27/2014   NSVT (nonsustained ventricular tachycardia) ([name])    Nausea without vomiting    Autonomic orthostatic hypotension 07/11/2014   Lower urinary tract infectious disease 07/08/2014   S/P CABG x 3 07/08/2014   History of cerebrovascular accident (CVA) in adulthood 07/08/2014   Hypokalemia 03/23/2014   Type 2 diabetes mellitus with neurological complications ([place]) 17/40/8144   Itching 03/16/2012   Rash 03/16/2012   Contact dermatitis 02/27/2012   Medicare annual wellness visit, initial 10/28/2011   Hyperlipidemia associated with type 2 diabetes mellitus ([place])    Idiopathic angioedema 06/23/2011   Vitiligo 06/23/2011   Dermatitis 06/23/2011   MDD (major depressive disorder), recurrent severe, without psychosis ([place]) 05/08/2006   Hypertension, essential 05/08/2006   MENOPAUSAL SYNDROME 05/08/2006   PCP:  [name], MD Pharmacy:   [facility] - [place], [place] - [address] [place] [address] Phone: [phone] Fax: [phone]  Social Determinants of Health (SDOH) Interventions    Readmission Risk Interventions     No data to display

## 2022-02-27 DIAGNOSIS — E860 Dehydration: Secondary | ICD-10-CM | POA: Diagnosis not present

## 2022-02-27 LAB — GLUCOSE, CAPILLARY
Glucose-Capillary: 175 mg/dL — ABNORMAL HIGH (ref 70–99)
Glucose-Capillary: 132 mg/dL — ABNORMAL HIGH (ref 70–99)
Glucose-Capillary: 157 mg/dL — ABNORMAL HIGH (ref 70–99)
Glucose-Capillary: 81 mg/dL (ref 70–99)

## 2022-02-27 LAB — COMPREHENSIVE METABOLIC PANEL
ALT: 14 U/L (ref 0–44)
AST: 17 U/L (ref 15–41)
Albumin: 2.6 g/dL — ABNORMAL LOW (ref 3.5–5.0)
Alkaline Phosphatase: 44 U/L (ref 38–126)
Anion gap: 6 (ref 5–15)
BUN: <5 mg/dL — ABNORMAL LOW (ref 8–23)
CO2: 20 mmol/L — ABNORMAL LOW (ref 22–32)
Calcium: 7.9 mg/dL — ABNORMAL LOW (ref 8.9–10.3)
Chloride: 113 mmol/L — ABNORMAL HIGH (ref 98–111)
Creatinine, Ser: 0.93 mg/dL (ref 0.44–1.00)
GFR, Estimated: >60 mL/min (ref >60)
Glucose, Bld: 150 mg/dL — ABNORMAL HIGH (ref 70–99)
Potassium: 4 mmol/L (ref 3.5–5.1)
Sodium: 139 mmol/L (ref 135–145)
Total Bilirubin: 0.5 mg/dL (ref 0.3–1.2)
Total Protein: 5.3 g/dL — ABNORMAL LOW (ref 6.5–8.1)

## 2022-02-27 LAB — CBC
HCT: 35.7 % — ABNORMAL LOW (ref 36.0–46.0)
Hemoglobin: 11.7 g/dL — ABNORMAL LOW (ref 12.0–15.0)
MCH: 29.4 pg (ref 26.0–34.0)
MCHC: 32.8 g/dL (ref 30.0–36.0)
MCV: 89.7 fL (ref 80.0–100.0)
Platelets: 187 10*3/uL (ref 150–400)
RBC: 3.98 MIL/uL (ref 3.87–5.11)
RDW: 13.6 % (ref 11.5–15.5)
WBC: 5.6 10*3/uL (ref 4.0–10.5)
nRBC: 0 % (ref 0.0–0.2)

## 2022-02-27 MED ORDER — METFORMIN HCL ER 500 MG PO TB24
500.0000 mg | ORAL_TABLET | Freq: Every day | ORAL | Status: DC
  Administered 2022-02-28 – 2022-03-06 (×6): 500 mg via ORAL
  Filled 2022-02-27 (×8): qty 1

## 2022-02-27 MED ORDER — METOPROLOL SUCCINATE ER 25 MG PO TB24
37.5000 mg | ORAL_TABLET | Freq: Every day | ORAL | Status: DC
  Administered 2022-02-28 – 2022-03-12 (×13): 37.5 mg via ORAL
  Filled 2022-02-27 (×13): qty 2

## 2022-02-27 MED ORDER — TRAMADOL HCL 50 MG PO TABS
50.0000 mg | ORAL_TABLET | Freq: Once | ORAL | Status: AC
  Administered 2022-02-27: 50 mg via ORAL
  Filled 2022-02-27: qty 1

## 2022-02-27 NOTE — Progress Notes (Signed)
Tele notified of 11 beat run of vtach for patient at 2105 at [place]. Patient had no c/o at this time. Notified J. Olena Heckle, NP. Will contine to monitor.

## 2022-02-27 NOTE — Progress Notes (Signed)
PROGRESS NOTE   Maria Fox  VHQ:[id number] DOB: [birth year] DOA: 02/23/2022 PCP: [name], MD  Brief Narrative:  76 year old white female home dwelling Known HTN HLD DM TY 2  prior CVA-multi infarcts and corona radiata during CABG-left PCA stroke 2017 Severe three-vessel CAD status post CABG 05/2014, subsequent PCI 11/13/2017 supposed to be 6 months dual antiplatelet DM TY 2 with stage ii CKD Known chronic NSVT since 2016 EF 45% echo 03/27/2014-- developed a flutter 2-1 in 06/2020 Left lower extremity DVT 2022 Peripheral neuropathy Prior buttock abscess status post incision and drainage 08/05/2020  Presented to ED 12/16 with a week long illness nausea vomiting diarrhea and weakness to the point of being unable to ambulate-found to have influenza A as well as dehydration Contact dermatitis on buttocks and perineum   Hospital-Problem based course  Influenza A -Supportive management + Tamiflu, x-ray 12/16 showed no superimposed PNA -Completing course of Tamiflu 12/20 and keep on contact precautions  AKI superimposed on CKD 3 on admission -Continuing NS 75 cc/H, repeat labs periodically - Hold at this time metformin, Lasix 20 from PTA resume slowly in the next several days  Abdominal pain diarrhea?  UTI -Urine culture is pending--continue ceftriaxone for now until results - diarrhea seems to not be an issue currently--stopping MiraLAX and Colace  SVT superimposed on chronic A-fib 2-1 Underlying CAD -2 nights patient has had beats of up to 15 extra - Increase metoprolol to 37.5 XL and monitor trends -Continue apixaban 5 twice daily -Magnesium replaced earlier in hospitalization-recheck in morning  Contact dermatitis -Bumex cream ordered as well as nystatin and monitor  DM TY 2 last A1c 9.5, associated with diabetic neuropathy - Continue insulin sliding scale coverage and drop metformin dose to 500 daily with breakfast - Continue Neurontin 300 twice  daily   HFpEF -Lasix held, 2D echo EF 60 to 65%  Prior strokes -Continue Plavix 75 daily  DVT prophylaxis: Eliquis Code Status: Full Family Communication: None present Disposition:  Status is: Inpatient Remains inpatient appropriate because:   Still unable to eat drink and somewhat dizzy   Consultants:  None  Procedures: No   Antimicrobials: No   Subjective: Overall not feeling well feels somewhat weak-also with a little nausea has not been able to get up today but was able to do so yesterday-has not eaten Awake and coherent however  Objective: Vitals:   02/26/22 1457 02/26/22 2204 02/27/22 0500 02/27/22 0604  BP: (!) 165/88 (!) 180/93  (!) 154/71  Pulse: 72 86  85  Resp: '18 18  18  '$ Temp: (!) 97.1 F (36.2 C) 98.5 F (36.9 C)  (!) 97.5 F (36.4 C)  TempSrc: Oral   Oral  SpO2: 97% 95%  96%  Weight:   63.1 kg   Height:        Intake/Output Summary (Last 24 hours) at 02/27/2022 0912 Last data filed at 02/27/2022 4132 Gross per 24 hour  Intake 212.71 ml  Output 1601 ml  Net -1388.29 ml   Filed Weights   02/25/22 0451 02/26/22 0500 02/27/22 0500  Weight: 62.1 kg 64.7 kg 63.1 kg    Examination:  EOMI NCAT no focal deficit no icterus no pallor Neck is soft supple S1-S2 no noted murmur Chest is clear with no wheeze no rales no rhonchi Abdomen is soft no rebound no guarding No lower extremity edema Psych is euthymic  Data Reviewed: personally reviewed   CBC    Component Value Date/Time   WBC 5.6 02/27/2022 0320  RBC 3.98 02/27/2022 0320   HGB 11.7 (L) 02/27/2022 0320   HGB 13.5 01/28/2020 1532   HCT 35.7 (L) 02/27/2022 0320   HCT 40.0 01/28/2020 1532   PLT 187 02/27/2022 0320   PLT 226 01/28/2020 1532   MCV 89.7 02/27/2022 0320   MCV 88 01/28/2020 1532   MCH 29.4 02/27/2022 0320   MCHC 32.8 02/27/2022 0320   RDW 13.6 02/27/2022 0320   RDW 13.3 01/28/2020 1532   LYMPHSABS 1.6 02/23/2022 1550   MONOABS 0.4 02/23/2022 1550   EOSABS 0.1  02/23/2022 1550   BASOSABS 0.0 02/23/2022 1550      Latest Ref Rng & Units 02/27/2022    3:20 AM 02/26/2022    3:42 AM 02/25/2022    3:55 AM  CMP  Glucose 70 - 99 mg/dL 150  136  126   BUN 8 - 23 mg/dL '5  5  8   '$ Creatinine 0.44 - 1.00 mg/dL 0.93  0.90  1.07   Sodium 135 - 145 mmol/L 139  140  139   Potassium 3.5 - 5.1 mmol/L 4.0  4.0  3.4   Chloride 98 - 111 mmol/L 113  115  116   CO2 22 - 32 mmol/L '20  19  19   '$ Calcium 8.9 - 10.3 mg/dL 7.9  8.0  7.5   Total Protein 6.5 - 8.1 g/dL 5.3  5.5  5.1   Total Bilirubin 0.3 - 1.2 mg/dL 0.5  0.6  0.7   Alkaline Phos 38 - 126 U/L 44  49  44   AST 15 - 41 U/L '17  22  16   '$ ALT 0 - 44 U/L '14  13  13      '$ Radiology Studies: ECHOCARDIOGRAM COMPLETE  Result Date: 02/25/2022    ECHOCARDIOGRAM REPORT   Patient Name:   Maria Fox Date of Exam: 02/25/2022 Medical Rec #:  [id number]    Height:       62.0 in Accession #:    [id number]   Weight:       136.9 lb Date of Birth:  1946/02/21    BSA:          1.627 m Patient Age:    27 years     BP:           129/70 mmHg Patient Gender: F            HR:           73 bpm. Exam Location:  Inpatient Procedure: 2D Echo, Cardiac Doppler and Color Doppler Indications:    CHF  History:        Patient has prior history of Echocardiogram examinations, most                 recent 07/24/2020. CHF, CAD, Prior CABG, Stroke and DVT, TR/MR,                 Arrythmias:NSVT, Atrial Fibrillation and Atrial Flutter,                 Signs/Symptoms:Hypotension; Risk Factors:Hypertension,                 Dyslipidemia and Diabetes. AIDS, angioedema.  Sonographer:    Eartha Inch Referring Phys: [id number] STEPHEN K CHIU  Sonographer Comments: Image acquisition challenging due to patient body habitus and Image acquisition challenging due to respiratory motion. IMPRESSIONS  1. Left ventricular ejection fraction, by estimation, is 60 to 65%. The left ventricle has normal function.  The left ventricle has no regional wall motion abnormalities.  Indeterminate diastolic filling due to E-A fusion.  2. Right ventricular systolic function is normal. The right ventricular size is normal. There is normal pulmonary artery systolic pressure. The estimated right ventricular systolic pressure is 93.7 mmHg.  3. The mitral valve is grossly normal. Mild to moderate mitral valve regurgitation. No evidence of mitral stenosis.  4. The aortic valve is tricuspid. Aortic valve regurgitation is not visualized. No aortic stenosis is present.  5. The inferior vena cava is dilated in size with >50% respiratory variability, suggesting right atrial pressure of 8 mmHg. Comparison(s): No significant change from prior study. FINDINGS  Left Ventricle: Left ventricular ejection fraction, by estimation, is 60 to 65%. The left ventricle has normal function. The left ventricle has no regional wall motion abnormalities. The left ventricular internal cavity size was normal in size. There is  no left ventricular hypertrophy. Abnormal (paradoxical) septal motion consistent with post-operative status. Left ventricular diastolic function could not be evaluated due to atrial fibrillation. Indeterminate diastolic filling due to E-A fusion. Right Ventricle: The right ventricular size is normal. No increase in right ventricular wall thickness. Right ventricular systolic function is normal. There is normal pulmonary artery systolic pressure. The tricuspid regurgitant velocity is 1.91 m/s, and  with an assumed right atrial pressure of 8 mmHg, the estimated right ventricular systolic pressure is 16.9 mmHg. Left Atrium: Left atrial size was normal in size. Right Atrium: Right atrial size was normal in size. Pericardium: There is no evidence of pericardial effusion. Mitral Valve: The mitral valve is grossly normal. Mild to moderate mitral valve regurgitation. No evidence of mitral valve stenosis. Tricuspid Valve: The tricuspid valve is grossly normal. Tricuspid valve regurgitation is mild . No evidence  of tricuspid stenosis. Aortic Valve: The aortic valve is tricuspid. Aortic valve regurgitation is not visualized. No aortic stenosis is present. Pulmonic Valve: The pulmonic valve was grossly normal. Pulmonic valve regurgitation is not visualized. No evidence of pulmonic stenosis. Aorta: The aortic root and ascending aorta are structurally normal, with no evidence of dilitation. Venous: The inferior vena cava is dilated in size with greater than 50% respiratory variability, suggesting right atrial pressure of 8 mmHg. IAS/Shunts: The atrial septum is grossly normal.  LEFT VENTRICLE PLAX 2D LVIDd:         3.80 cm     Diastology LVIDs:         2.80 cm     LV e' medial:    4.24 cm/s LV PW:         1.20 cm     LV E/e' medial:  30.4 LV IVS:        1.00 cm     LV e' lateral:   3.92 cm/s LVOT diam:     2.10 cm     LV E/e' lateral: 32.9 LV SV:         70 LV SV Index:   43 LVOT Area:     3.46 cm  LV Volumes (MOD) LV vol d, MOD A2C: 58.9 ml LV vol d, MOD A4C: 77.0 ml LV vol s, MOD A2C: 22.9 ml LV vol s, MOD A4C: 28.6 ml LV SV MOD A2C:     36.0 ml LV SV MOD A4C:     77.0 ml LV SV MOD BP:      42.4 ml RIGHT VENTRICLE            IVC RV S prime:     9.14  cm/s  IVC diam: 2.10 cm TAPSE (M-mode): 1.7 cm LEFT ATRIUM             Index        RIGHT ATRIUM           Index LA diam:        4.40 cm 2.70 cm/m   RA Area:     13.10 cm LA Vol (A2C):   69.0 ml 42.37 ml/m  RA Volume:   26.00 ml  15.98 ml/m LA Vol (A4C):   46.5 ml 28.58 ml/m LA Biplane Vol: 53.9 ml 33.12 ml/m  AORTIC VALVE LVOT Vmax:   95.00 cm/s LVOT Vmean:  66.400 cm/s LVOT VTI:    0.203 m  AORTA Ao Root diam: 3.30 cm Ao Asc diam:  3.30 cm MITRAL VALVE                TRICUSPID VALVE MV Area (PHT): 3.42 cm     TR Peak grad:   14.6 mmHg MV Decel Time: 222 msec     TR Vmax:        191.00 cm/s MR Peak grad: 104.9 mmHg MR Mean grad: 70.0 mmHg     SHUNTS MR Vmax:      512.00 cm/s   Systemic VTI:  0.20 m MR Vmean:     401.0 cm/s    Systemic Diam: 2.10 cm MV E velocity: 129.00  cm/s Eleonore Chiquito MD Electronically signed by Eleonore Chiquito MD Signature Date/Time: 02/25/2022/4:40:52 PM    Final      Scheduled Meds:  apixaban  5 mg Oral BID   atorvastatin  40 mg Oral QPC supper   clopidogrel  75 mg Oral Daily   DULoxetine  30 mg Oral Daily   gabapentin  300 mg Oral BID   insulin aspart  0-15 Units Subcutaneous TID WC   isosorbide mononitrate  15 mg Oral Q breakfast   metFORMIN  1,000 mg Oral Q breakfast   metoprolol succinate  25 mg Oral Daily   nystatin   Topical BID   oseltamivir  30 mg Oral BID   pantoprazole  40 mg Oral BID AC   silver sulfADIAZINE   Topical Daily   zinc oxide   Topical BID   Continuous Infusions:  sodium chloride 75 mL/hr at 02/27/22 0616   cefTRIAXone (ROCEPHIN)  IV Stopped (02/26/22 1221)   sodium chloride Stopped (02/24/22 1129)     LOS: 4 days   Time spent: 50  Nita Sells, MD [facility] To contact the attending provider between 7A-7P or the covering provider during after hours 7P-7A, please log into the web site [email or web address] and access using universal [place] password for that web site. If you do not have the password, please call the hospital operator.  02/27/2022, 9:12 AM

## 2022-02-27 NOTE — Progress Notes (Signed)
Tele notiifed of 5 beat run of vtach at 1855. Patient had no c/o at this time. Notified A. Zebedee Iba, NP. Will continue to monitor.

## 2022-02-27 NOTE — Progress Notes (Signed)
Mobility Specialist - Progress Note   02/27/22 0956  Orthostatic Lying   BP- Lying (!) 163/93  Pulse- Lying 84  Orthostatic Sitting  BP- Sitting (!) 154/104  Pulse- Sitting 92  Mobility  Activity Dangled on edge of bed  Level of Assistance Minimal assist, patient does 75% or more  Activity Response Tolerated fair  $Mobility charge 1 Mobility   Pt was found in bed and agreeable to ambulate in room. Pt stated feeling unwell and when sitting EOB stated feeling dizzy and nauseous. At EOS returned to lying in bed and RN was notified of session.   Ferd Hibbs Mobility Specialist

## 2022-02-28 DIAGNOSIS — E860 Dehydration: Secondary | ICD-10-CM | POA: Diagnosis not present

## 2022-02-28 LAB — COMPREHENSIVE METABOLIC PANEL
ALT: 12 U/L (ref 0–44)
AST: 14 U/L — ABNORMAL LOW (ref 15–41)
Albumin: 3 g/dL — ABNORMAL LOW (ref 3.5–5.0)
Alkaline Phosphatase: 51 U/L (ref 38–126)
Anion gap: 6 (ref 5–15)
BUN: <5 mg/dL — ABNORMAL LOW (ref 8–23)
CO2: 19 mmol/L — ABNORMAL LOW (ref 22–32)
Calcium: 7.9 mg/dL — ABNORMAL LOW (ref 8.9–10.3)
Chloride: 113 mmol/L — ABNORMAL HIGH (ref 98–111)
Creatinine, Ser: 0.89 mg/dL (ref 0.44–1.00)
GFR, Estimated: >60 mL/min (ref >60)
Glucose, Bld: 104 mg/dL — ABNORMAL HIGH (ref 70–99)
Potassium: 4 mmol/L (ref 3.5–5.1)
Sodium: 138 mmol/L (ref 135–145)
Total Bilirubin: 0.5 mg/dL (ref 0.3–1.2)
Total Protein: 5.6 g/dL — ABNORMAL LOW (ref 6.5–8.1)

## 2022-02-28 LAB — CBC
HCT: 35.6 % — ABNORMAL LOW (ref 36.0–46.0)
Hemoglobin: 11.7 g/dL — ABNORMAL LOW (ref 12.0–15.0)
MCH: 29.8 pg (ref 26.0–34.0)
MCHC: 32.9 g/dL (ref 30.0–36.0)
MCV: 90.8 fL (ref 80.0–100.0)
Platelets: 190 10*3/uL (ref 150–400)
RBC: 3.92 MIL/uL (ref 3.87–5.11)
RDW: 13.5 % (ref 11.5–15.5)
WBC: 5.3 10*3/uL (ref 4.0–10.5)
nRBC: 0 % (ref 0.0–0.2)

## 2022-02-28 LAB — GLUCOSE, CAPILLARY
Glucose-Capillary: 138 mg/dL — ABNORMAL HIGH (ref 70–99)
Glucose-Capillary: 117 mg/dL — ABNORMAL HIGH (ref 70–99)
Glucose-Capillary: 118 mg/dL — ABNORMAL HIGH (ref 70–99)
Glucose-Capillary: 181 mg/dL — ABNORMAL HIGH (ref 70–99)

## 2022-02-28 LAB — URINE CULTURE: Culture: >=100000 — AB

## 2022-02-28 LAB — MAGNESIUM: Magnesium: 1.6 mg/dL — ABNORMAL LOW (ref 1.7–2.4)

## 2022-02-28 MED ORDER — ONDANSETRON 4 MG PO TBDP
8.0000 mg | ORAL_TABLET | Freq: Three times a day (TID) | ORAL | Status: DC | PRN
  Administered 2022-02-28 – 2022-03-05 (×10): 8 mg via ORAL
  Filled 2022-02-28 (×10): qty 2

## 2022-02-28 MED ORDER — CEPHALEXIN 500 MG PO CAPS
500.0000 mg | ORAL_CAPSULE | Freq: Two times a day (BID) | ORAL | Status: DC
  Administered 2022-03-01: 500 mg via ORAL
  Filled 2022-02-28: qty 1

## 2022-02-28 MED ORDER — MAGNESIUM OXIDE -MG SUPPLEMENT 400 (240 MG) MG PO TABS
800.0000 mg | ORAL_TABLET | Freq: Two times a day (BID) | ORAL | Status: DC
  Administered 2022-02-28 – 2022-03-02 (×6): 800 mg via ORAL
  Filled 2022-02-28 (×7): qty 2

## 2022-02-28 MED ORDER — ORAL CARE MOUTH RINSE: 15.0000 mL | OROMUCOSAL | Status: DC | PRN

## 2022-02-28 NOTE — Progress Notes (Signed)
PT Cancellation Note  Patient Details Name: Maria Fox MRN: [id number] DOB: Aug 06, 1945   Cancelled Treatment:     pt in bed, room dark.  Sleeping.  Per chart review BP 175/87 taken @ 2:34pm by NT while resting in bed.  Pt has been evaluated with rec to return home with [facility] PT        Nathanial Rancher 02/28/2022, 3:53 PM

## 2022-02-28 NOTE — Progress Notes (Signed)
PROGRESS NOTE   Maria Fox  RFF:[id number] DOB: 03/26/1945 DOA: 02/23/2022 PCP: [name], MD  Brief Narrative:  76 year old white female home dwelling Known HTN HLD DM TY 2  prior CVA-multi infarcts and corona radiata during CABG-left PCA stroke 2017 Severe three-vessel CAD status post CABG 05/2014, subsequent PCI 11/13/2017 supposed to be 6 months dual antiplatelet DM TY 2 with stage ii CKD Known chronic NSVT since 2016 EF 45% echo 03/27/2014-- developed a flutter 2-1 in 06/2020 Left lower extremity DVT 2022 Peripheral neuropathy Prior buttock abscess status post incision and drainage 08/05/2020  Presented to ED 12/16 with a week long illness nausea vomiting diarrhea and weakness to the point of being unable to ambulate-found to have influenza A as well as dehydration Contact dermatitis on buttocks and perineum   Hospital-Problem based course  Influenza A -Supportive management + Tamiflu, x-ray 12/16 showed no superimposed PNA - Tamiflu completed 12/21  AKI superimposed on CKD 3 on admission -Continuing NS 75 cc/H, as she is not really able to eat today we will continue fluids - Hold Lasix 20 from PTA resume slowly in the next several days  Nausea abdominal discomfort -passing gas, placed back on Zofran which is a home med 3 times daily and see how she does  Klebsiella/E. coli found in urine - Received 3 doses ceftriaxone, transition to Keflex 500 twice daily - diarrhea seems to not be an issue currently--stopping MiraLAX and Colace  SVT superimposed on chronic A-fib 2-1 Underlying CAD - Increased metoprolol to 37.5 XL and monitor trends -Continue apixaban 5 twice daily -Magnesium low so replacing with oral Mag-Ox 800 twice daily  Contact dermatitis -Bumex cream ordered as well as nystatin and monitor  DM TY 2 last A1c 9.5, associated with diabetic neuropathy - Continue insulin sliding scale coverage and drop metformin dose to 500 daily with breakfast - Continue  Neurontin 300 twice daily  HFpEF -Lasix held, 2D echo EF 60 to 65%  Prior strokes -Continue Plavix 75 daily  DVT prophylaxis: Eliquis Code Status: Full Family Communication: None present Disposition:  Status is: Inpatient Remains inpatient appropriate because:   Not really eating and still quite weak   Consultants:  None  Procedures: No   Antimicrobials: No   Subjective:  Some nausea abdominal pain, is passing gas, feels slightly warm No chest pain no overt fever Not really eating still   Objective: Vitals:   02/27/22 1443 02/28/22 0033 02/28/22 0500 02/28/22 0514  BP: (!) 162/91 (!) 156/92  (!) 171/95  Pulse: 85 91  80  Resp: '17 16  18  '$ Temp: 97.9 F (36.6 C) (!) 97.4 F (36.3 C)  (!) 97.5 F (36.4 C)  TempSrc: Oral Oral  Oral  SpO2: 95% 98%  97%  Weight:   59.5 kg   Height:        Intake/Output Summary (Last 24 hours) at 02/28/2022 1348 Last data filed at 02/28/2022 0746 Gross per 24 hour  Intake 3561.1 ml  Output 750 ml  Net 2811.1 ml    Filed Weights   02/26/22 0500 02/27/22 0500 02/28/22 0500  Weight: 64.7 kg 63.1 kg 59.5 kg    Examination:  EOMI NCAT no focal deficit no icterus no pallor Neck is soft  Slightly warm to touch Chest is clear no wheeze rales or rhonchi Abdomen soft no rebound no guarding no tenderness ROM intact Neurologically intact power 5/5  Data Reviewed: personally reviewed   CBC    Component Value Date/Time   WBC  5.3 02/28/2022 0313   RBC 3.92 02/28/2022 0313   HGB 11.7 (L) 02/28/2022 0313   HGB 13.5 01/28/2020 1532   HCT 35.6 (L) 02/28/2022 0313   HCT 40.0 01/28/2020 1532   PLT 190 02/28/2022 0313   PLT 226 01/28/2020 1532   MCV 90.8 02/28/2022 0313   MCV 88 01/28/2020 1532   MCH 29.8 02/28/2022 0313   MCHC 32.9 02/28/2022 0313   RDW 13.5 02/28/2022 0313   RDW 13.3 01/28/2020 1532   LYMPHSABS 1.6 02/23/2022 1550   MONOABS 0.4 02/23/2022 1550   EOSABS 0.1 02/23/2022 1550   BASOSABS 0.0 02/23/2022  1550      Latest Ref Rng & Units 02/28/2022    3:13 AM 02/27/2022    3:20 AM 02/26/2022    3:42 AM  CMP  Glucose 70 - 99 mg/dL 104  150  136   BUN 8 - 23 mg/dL <5  <5  5   Creatinine 0.44 - 1.00 mg/dL 0.89  0.93  0.90   Sodium 135 - 145 mmol/L 138  139  140   Potassium 3.5 - 5.1 mmol/L 4.0  4.0  4.0   Chloride 98 - 111 mmol/L 113  113  115   CO2 22 - 32 mmol/L '19  20  19   '$ Calcium 8.9 - 10.3 mg/dL 7.9  7.9  8.0   Total Protein 6.5 - 8.1 g/dL 5.6  5.3  5.5   Total Bilirubin 0.3 - 1.2 mg/dL 0.5  0.5  0.6   Alkaline Phos 38 - 126 U/L 51  44  49   AST 15 - 41 U/L '14  17  22   '$ ALT 0 - 44 U/L '12  14  13      '$ Radiology Studies: No results found.   Scheduled Meds:  apixaban  5 mg Oral BID   atorvastatin  40 mg Oral QPC supper   [START ON 03/01/2022] cephALEXin  500 mg Oral Q12H   clopidogrel  75 mg Oral Daily   DULoxetine  30 mg Oral Daily   gabapentin  300 mg Oral BID   insulin aspart  0-15 Units Subcutaneous TID WC   isosorbide mononitrate  15 mg Oral Q breakfast   magnesium oxide  800 mg Oral BID   metFORMIN  500 mg Oral Q breakfast   metoprolol succinate  37.5 mg Oral Daily   nystatin   Topical BID   pantoprazole  40 mg Oral BID AC   silver sulfADIAZINE   Topical Daily   zinc oxide   Topical BID   Continuous Infusions:  sodium chloride 75 mL/hr at 02/28/22 1124   sodium chloride Stopped (02/24/22 1129)     LOS: 5 days   Time spent: [place], MD [facility] To contact the attending provider between 7A-7P or the covering provider during after hours 7P-7A, please log into the web site [email or web address] and access using universal [place] password for that web site. If you do not have the password, please call the hospital operator.  02/28/2022, 1:48 PM

## 2022-02-28 NOTE — Care Management Important Message (Signed)
Important Message  Patient Details IM Letter given. Name: Maria Fox MRN: [id number] Date of Birth: [birth year]   Medicare Important Message Given:  Yes     Kerin Salen 02/28/2022, 9:47 AM

## 2022-03-01 ENCOUNTER — Inpatient Hospital Stay (HOSPITAL_COMMUNITY): Payer: Medicare Other

## 2022-03-01 DIAGNOSIS — E860 Dehydration: Secondary | ICD-10-CM | POA: Diagnosis not present

## 2022-03-01 LAB — CBC
HCT: 35.5 % — ABNORMAL LOW (ref 36.0–46.0)
Hemoglobin: 11.6 g/dL — ABNORMAL LOW (ref 12.0–15.0)
MCH: 29.5 pg (ref 26.0–34.0)
MCHC: 32.7 g/dL (ref 30.0–36.0)
MCV: 90.3 fL (ref 80.0–100.0)
Platelets: 206 10*3/uL (ref 150–400)
RBC: 3.93 MIL/uL (ref 3.87–5.11)
RDW: 13.8 % (ref 11.5–15.5)
WBC: 5.6 10*3/uL (ref 4.0–10.5)
nRBC: 0 % (ref 0.0–0.2)

## 2022-03-01 LAB — COMPREHENSIVE METABOLIC PANEL
ALT: 11 U/L (ref 0–44)
AST: 14 U/L — ABNORMAL LOW (ref 15–41)
Albumin: 3.1 g/dL — ABNORMAL LOW (ref 3.5–5.0)
Alkaline Phosphatase: 55 U/L (ref 38–126)
Anion gap: 7 (ref 5–15)
BUN: 5 mg/dL — ABNORMAL LOW (ref 8–23)
CO2: 20 mmol/L — ABNORMAL LOW (ref 22–32)
Calcium: 8.1 mg/dL — ABNORMAL LOW (ref 8.9–10.3)
Chloride: 114 mmol/L — ABNORMAL HIGH (ref 98–111)
Creatinine, Ser: 0.94 mg/dL (ref 0.44–1.00)
GFR, Estimated: >60 mL/min (ref >60)
Glucose, Bld: 130 mg/dL — ABNORMAL HIGH (ref 70–99)
Potassium: 3.9 mmol/L (ref 3.5–5.1)
Sodium: 141 mmol/L (ref 135–145)
Total Bilirubin: 0.8 mg/dL (ref 0.3–1.2)
Total Protein: 5.6 g/dL — ABNORMAL LOW (ref 6.5–8.1)

## 2022-03-01 LAB — LACTIC ACID, PLASMA
Lactic Acid, Venous: 0.9 mmol/L (ref 0.5–1.9)
Lactic Acid, Venous: 2.3 mmol/L (ref 0.5–1.9)

## 2022-03-01 LAB — GLUCOSE, CAPILLARY
Glucose-Capillary: 159 mg/dL — ABNORMAL HIGH (ref 70–99)
Glucose-Capillary: 203 mg/dL — ABNORMAL HIGH (ref 70–99)
Glucose-Capillary: 163 mg/dL — ABNORMAL HIGH (ref 70–99)
Glucose-Capillary: 112 mg/dL — ABNORMAL HIGH (ref 70–99)

## 2022-03-01 LAB — MAGNESIUM: Magnesium: 1.6 mg/dL — ABNORMAL LOW (ref 1.7–2.4)

## 2022-03-01 LAB — PROCALCITONIN: Procalcitonin: 1.7 ng/mL

## 2022-03-01 MED ORDER — SODIUM CHLORIDE 0.9 % IV SOLN: INTRAVENOUS | Status: DC

## 2022-03-01 MED ORDER — SODIUM CHLORIDE 0.9 % IV SOLN
2.0000 g | Freq: Two times a day (BID) | INTRAVENOUS | Status: DC
  Administered 2022-03-01 – 2022-03-02 (×2): 2 g via INTRAVENOUS
  Filled 2022-03-01 (×2): qty 12.5

## 2022-03-01 MED ORDER — METRONIDAZOLE 500 MG/100ML IV SOLN
500.0000 mg | Freq: Two times a day (BID) | INTRAVENOUS | Status: DC
  Administered 2022-03-01 – 2022-03-05 (×9): 500 mg via INTRAVENOUS
  Filled 2022-03-01 (×10): qty 100

## 2022-03-01 MED ORDER — MAGNESIUM SULFATE 2 GM/50ML IV SOLN
2.0000 g | Freq: Once | INTRAVENOUS | Status: AC
  Administered 2022-03-01: 2 g via INTRAVENOUS
  Filled 2022-03-01: qty 50

## 2022-03-01 NOTE — Plan of Care (Signed)
  Problem: Pain Managment: Goal: General experience of comfort will improve Outcome: Progressing   

## 2022-03-01 NOTE — Progress Notes (Signed)
Occupational Therapy Treatment Patient Details Name: AKIRRA LACERDA MRN: [id number] DOB: [birth year] Today's Date: 03/01/2022   History of present illness Patient is a 76 year old female who presented with a week history of nausea and vomiting with diarrhea. Patient was admitted with influenza A, dehydration, contact dermatitis. PMH: CKD II, DM, CVA 2016, CABG, L PCA stroke 2017, COVID 19, RBBB, autoimmune deficiency syndrome.   OT comments  Patient continues to be generally weak. She is able to transfer to edge of bed slowly but is slumped and tired once she gets there. She is able to stand at edge of bed and hold hold onto bed rail to maintain her balance to doff underwear and be cleaned after being found incontinent of watery BM. She fatigues quickly and requesting to go back to bed because she is cold. When asked about therapy - she declined any home therapy stating her partner could help her. She currently doesn't exhibit the physical abilities to take care of herself. Will continue to follow. If she doesn't progress soon - short term rehab may be a better option - if she is agreeable.     Recommendations for follow up therapy are one component of a multi-disciplinary discharge planning process, led by the attending physician.  Recommendations may be updated based on patient status, additional functional criteria and insurance authorization.    Follow Up Recommendations  Home health OT (Patient declining [facility] services though)     Assistance Recommended at Discharge Frequent or constant Supervision/Assistance  Patient can return home with the following  A little help with walking and/or transfers;A lot of help with bathing/dressing/bathroom;Assistance with cooking/housework;Help with stairs or ramp for entrance;Assist for transportation   Equipment Recommendations  BSC/3in1    Recommendations for Other Services      Precautions / Restrictions Precautions Precautions: Fall Precaution  Comments: incontinent (bladder, bowel); dizziness Restrictions Weight Bearing Restrictions: No       Mobility Bed Mobility                    Transfers                         Balance Overall balance assessment: Mild deficits observed, not formally tested                                         ADL either performed or assessed with clinical judgement   ADL Overall ADL's : Needs assistance/impaired                     Lower Body Dressing: Maximal assistance Lower Body Dressing Details (indicate cue type and reason): to doff underwear. Patient able to stand holding onto bed rail but unable to assist wtih clothing.     Toileting- Clothing Manipulation and Hygiene: Total assistance;Sit to/from stand Toileting - Clothing Manipulation Details (indicate cue type and reason): total assist for pericare as patient stood at edge of bed     Functional mobility during ADLs: Minimal assistance General ADL Comments: Patient continues to be weak. Able to slowly transition to edge of bed and stand holding onto bedrail to doff underwear and clean up after incontinence of BM and while therapist changed bed lines.    Extremity/Trunk Assessment Upper Extremity Assessment Upper Extremity Assessment: Generalized weakness   Lower Extremity Assessment Lower Extremity Assessment: Generalized weakness  Cervical / Trunk Assessment Cervical / Trunk Assessment: Normal    Vision   Vision Assessment?: No apparent visual deficits   Perception     Praxis      Cognition Arousal/Alertness: Awake/alert Behavior During Therapy: WFL for tasks assessed/performed Overall Cognitive Status: Within Functional Limits for tasks assessed                                          Exercises      Shoulder Instructions       General Comments      Pertinent Vitals/ Pain       Pain Assessment Pain Assessment: Faces Faces Pain Scale: Hurts  little more Pain Location: buttocks Pain Descriptors / Indicators: Grimacing, Burning Pain Intervention(s): Monitored during session  Home Living                                          Prior Functioning/Environment              Frequency  Min 2X/week        Progress Toward Goals  OT Goals(current goals can now be found in the care plan section)  Progress towards OT goals: OT to reassess next treatment  Acute Rehab OT Goals Patient Stated Goal: get stronger and go home OT Goal Formulation: With patient Time For Goal Achievement: 03/12/22 Potential to Achieve Goals: [place] Discharge plan remains appropriate    Co-evaluation                 AM-PAC OT "6 Clicks" Daily Activity     Outcome Measure   Help from another person eating meals?: None Help from another person taking care of personal grooming?: A Little Help from another person toileting, which includes using toliet, bedpan, or urinal?: Total Help from another person bathing (including washing, rinsing, drying)?: A Lot Help from another person to put on and taking off regular upper body clothing?: A Lot Help from another person to put on and taking off regular lower body clothing?: Total 6 Click Score: 13    End of Session    OT Visit Diagnosis: Muscle weakness (generalized) (M62.81);Pain   Activity Tolerance Patient limited by fatigue   Patient Left in bed;with call bell/phone within reach;with nursing/sitter in room   Nurse Communication  (okay to see)        Time: 2993-7169 OT Time Calculation (min): 20 min  Charges: OT General Charges $OT Visit: 1 Visit OT Treatments $Self Care/Home Management : 8-22 mins  Gustavo Lah, OTR/L [place]  Office [phone]   Lenward Chancellor 03/01/2022, 4:08 PM

## 2022-03-01 NOTE — Progress Notes (Signed)
Lactic acid up CXR shows some concern for PNA Starting fluids and Zosyn Keep HOB 30 degrees

## 2022-03-01 NOTE — Progress Notes (Signed)
Date and time results received: 03/01/22 at 1450  Test: Lactic acid Critical Value: 2.3  Name of Provider Notified: Dr. Nita Sells, MD  Orders Received? Or Actions Taken?:  Received the following orders from Dr. Nita Sells, MD: IV Zosyn, IV Flagyl, IV Maxipime, have patient get up to chair, have bed at 30 degrees, and encourage incentive spirometer and flutter valve use.  Will continue to monitor patient.

## 2022-03-01 NOTE — Progress Notes (Signed)
PROGRESS NOTE   Maria Fox  IOE:[id number] DOB: 1945-12-23 DOA: 02/23/2022 PCP: [name], MD  Brief Narrative:  76 year old white female home dwelling Known HTN HLD DM TY 2  prior CVA-multi infarcts and corona radiata during CABG-left PCA stroke 2017 Severe three-vessel CAD status post CABG 05/2014, subsequent PCI 11/13/2017 supposed to be 6 months dual antiplatelet DM TY 2 with stage ii CKD Known chronic NSVT since 2016 EF 45% echo 03/27/2014-- developed a flutter 2-1 in 06/2020 Left lower extremity DVT 2022 Peripheral neuropathy Prior buttock abscess status post incision and drainage 08/05/2020  Presented to ED 12/16 with a week long illness nausea vomiting diarrhea and weakness to the point of being unable to ambulate-found to have influenza A as well as dehydration Contact dermatitis on buttocks and perineum   Hospital-Problem based course  Influenza A Possible superimposed bacterial pneumonia?  12/22 - Tamiflu completed 12/21 -She is not feeling well for the past day and a half or so and has increased sputum and is eating poorly -I will repeat an x-ray lactic acid procalcitonin 12/22 and compared with 12/16 -If positive will give antibiotics  AKI superimposed on CKD 3 on admission -Continuing NS 75 cc/H, as not eating reliably - Hold Lasix 20 from PTA resume when she is tolerating food better  Nausea abdominal discomfort -passing gas well as stool placed back on Zofran which is a home med 3 times daily and see how she does  Klebsiella/E. coli found in urine - Received 3 doses ceftriaxone, transition to Keflex 500 twice daily - diarrhea seems to not be an issue currently--stopping MiraLAX and Colace  SVT superimposed on chronic A-fib 2-1 Underlying CAD - Increased metoprolol to 37.5 XL and monitor trends -Continue apixaban 5 twice daily -Magnesium low s despite Mag-Ox 800 twice daily, adding 2 g of IV magnesium  Contact dermatitis -Bumex cream ordered as well  as nystatin and monitor  DM TY 2 last A1c 9.5, associated with diabetic neuropathy - Continue insulin sliding scale coverage, sugars reasonable below 180 and drop metformin dose to 500 daily with breakfast - Continue Neurontin 300 twice daily  HFpEF -Lasix held, 2D echo EF 60 to 65%  Prior strokes -Continue Plavix 75 daily  DVT prophylaxis: Eliquis Code Status: Full Family Communication: None present Disposition:  Status is: Inpatient Remains inpatient appropriate because:   Not really eating and still quite weak   Consultants:  None  Procedures: No   Antimicrobials: No   Subjective:  Awake coherent no distress but weak and not really eating-still does not feel good Passing gas, no stool, no fever, no chills but does not look well   Objective: Vitals:   02/28/22 1400 02/28/22 1434 02/28/22 2117 03/01/22 0451  BP:  (!) 175/87 (!) 153/83 (!) 140/79  Pulse:  69 80 86  Resp:  '15 18 16  '$ Temp: 97.8 F (36.6 C) 98 F (36.7 C) 98.7 F (37.1 C) 98.5 F (36.9 C)  TempSrc: Oral Oral  Oral  SpO2:  99% 95% 96%  Weight:    62.3 kg  Height:        Intake/Output Summary (Last 24 hours) at 03/01/2022 1017 Last data filed at 03/01/2022 0617 Gross per 24 hour  Intake 2305 ml  Output 750 ml  Net 1555 ml    Filed Weights   02/27/22 0500 02/28/22 0500 03/01/22 0451  Weight: 63.1 kg 59.5 kg 62.3 kg    Examination:  EOMI NCAT no focal deficit no icterus no pallor Neck  is soft  Slightly warm to touch Chest with diminished air entry posterolaterally Abdomen soft no rebound no guarding no tenderness ROM intact Neurologically intact power 5/5  Data Reviewed: personally reviewed   CBC    Component Value Date/Time   WBC 5.6 03/01/2022 0323   RBC 3.93 03/01/2022 0323   HGB 11.6 (L) 03/01/2022 0323   HGB 13.5 01/28/2020 1532   HCT 35.5 (L) 03/01/2022 0323   HCT 40.0 01/28/2020 1532   PLT 206 03/01/2022 0323   PLT 226 01/28/2020 1532   MCV 90.3 03/01/2022 0323    MCV 88 01/28/2020 1532   MCH 29.5 03/01/2022 0323   MCHC 32.7 03/01/2022 0323   RDW 13.8 03/01/2022 0323   RDW 13.3 01/28/2020 1532   LYMPHSABS 1.6 02/23/2022 1550   MONOABS 0.4 02/23/2022 1550   EOSABS 0.1 02/23/2022 1550   BASOSABS 0.0 02/23/2022 1550      Latest Ref Rng & Units 03/01/2022    3:23 AM 02/28/2022    3:13 AM 02/27/2022    3:20 AM  CMP  Glucose 70 - 99 mg/dL 130  104  150   BUN 8 - 23 mg/dL 5  <5  <5   Creatinine 0.44 - 1.00 mg/dL 0.94  0.89  0.93   Sodium 135 - 145 mmol/L 141  138  139   Potassium 3.5 - 5.1 mmol/L 3.9  4.0  4.0   Chloride 98 - 111 mmol/L 114  113  113   CO2 22 - 32 mmol/L '20  19  20   '$ Calcium 8.9 - 10.3 mg/dL 8.1  7.9  7.9   Total Protein 6.5 - 8.1 g/dL 5.6  5.6  5.3   Total Bilirubin 0.3 - 1.2 mg/dL 0.8  0.5  0.5   Alkaline Phos 38 - 126 U/L 55  51  44   AST 15 - 41 U/L '14  14  17   '$ ALT 0 - 44 U/L '11  12  14      '$ Radiology Studies: No results found.   Scheduled Meds:  apixaban  5 mg Oral BID   atorvastatin  40 mg Oral QPC supper   cephALEXin  500 mg Oral Q12H   clopidogrel  75 mg Oral Daily   DULoxetine  30 mg Oral Daily   gabapentin  300 mg Oral BID   insulin aspart  0-15 Units Subcutaneous TID WC   isosorbide mononitrate  15 mg Oral Q breakfast   magnesium oxide  800 mg Oral BID   metFORMIN  500 mg Oral Q breakfast   metoprolol succinate  37.5 mg Oral Daily   nystatin   Topical BID   pantoprazole  40 mg Oral BID AC   silver sulfADIAZINE   Topical Daily   zinc oxide   Topical BID   Continuous Infusions:  sodium chloride 75 mL/hr at 03/01/22 0617   sodium chloride Stopped (02/24/22 1129)     LOS: 6 days   Time spent: [place], MD [facility] To contact the attending provider between 7A-7P or the covering provider during after hours 7P-7A, please log into the web site [email or web address] and access using universal [place] password for that web site. If you do not have the password, please call the  hospital operator.  03/01/2022, 10:17 AM

## 2022-03-02 DIAGNOSIS — J101 Influenza due to other identified influenza virus with other respiratory manifestations: Secondary | ICD-10-CM

## 2022-03-02 DIAGNOSIS — E1149 Type 2 diabetes mellitus with other diabetic neurological complication: Secondary | ICD-10-CM

## 2022-03-02 DIAGNOSIS — I5032 Chronic diastolic (congestive) heart failure: Secondary | ICD-10-CM

## 2022-03-02 DIAGNOSIS — J181 Lobar pneumonia, unspecified organism: Secondary | ICD-10-CM | POA: Clinically undetermined

## 2022-03-02 DIAGNOSIS — E860 Dehydration: Secondary | ICD-10-CM | POA: Diagnosis not present

## 2022-03-02 HISTORY — DX: Lobar pneumonia, unspecified organism: J18.1

## 2022-03-02 LAB — CBC
HCT: 33.5 % — ABNORMAL LOW (ref 36.0–46.0)
Hemoglobin: 10.7 g/dL — ABNORMAL LOW (ref 12.0–15.0)
MCH: 30 pg (ref 26.0–34.0)
MCHC: 31.9 g/dL (ref 30.0–36.0)
MCV: 93.8 fL (ref 80.0–100.0)
Platelets: 197 10*3/uL (ref 150–400)
RBC: 3.57 MIL/uL — ABNORMAL LOW (ref 3.87–5.11)
RDW: 13.9 % (ref 11.5–15.5)
WBC: 6.1 10*3/uL (ref 4.0–10.5)
nRBC: 0 % (ref 0.0–0.2)

## 2022-03-02 LAB — COMPREHENSIVE METABOLIC PANEL
ALT: 11 U/L (ref 0–44)
AST: 12 U/L — ABNORMAL LOW (ref 15–41)
Albumin: 2.5 g/dL — ABNORMAL LOW (ref 3.5–5.0)
Alkaline Phosphatase: 61 U/L (ref 38–126)
Anion gap: 6 (ref 5–15)
BUN: 5 mg/dL — ABNORMAL LOW (ref 8–23)
CO2: 19 mmol/L — ABNORMAL LOW (ref 22–32)
Calcium: 8.2 mg/dL — ABNORMAL LOW (ref 8.9–10.3)
Chloride: 117 mmol/L — ABNORMAL HIGH (ref 98–111)
Creatinine, Ser: 1.41 mg/dL — ABNORMAL HIGH (ref 0.44–1.00)
GFR, Estimated: 38 mL/min — ABNORMAL LOW (ref >60)
Glucose, Bld: 145 mg/dL — ABNORMAL HIGH (ref 70–99)
Potassium: 5.4 mmol/L — ABNORMAL HIGH (ref 3.5–5.1)
Sodium: 142 mmol/L (ref 135–145)
Total Bilirubin: 0.4 mg/dL (ref 0.3–1.2)
Total Protein: 4.9 g/dL — ABNORMAL LOW (ref 6.5–8.1)

## 2022-03-02 LAB — GLUCOSE, CAPILLARY
Glucose-Capillary: 179 mg/dL — ABNORMAL HIGH (ref 70–99)
Glucose-Capillary: 192 mg/dL — ABNORMAL HIGH (ref 70–99)
Glucose-Capillary: 171 mg/dL — ABNORMAL HIGH (ref 70–99)
Glucose-Capillary: 141 mg/dL — ABNORMAL HIGH (ref 70–99)

## 2022-03-02 LAB — MAGNESIUM: Magnesium: 2 mg/dL (ref 1.7–2.4)

## 2022-03-02 MED ORDER — LOPERAMIDE HCL 2 MG PO CAPS
2.0000 mg | ORAL_CAPSULE | ORAL | Status: DC | PRN
  Administered 2022-03-02 – 2022-03-09 (×9): 2 mg via ORAL
  Filled 2022-03-02 (×10): qty 1

## 2022-03-02 MED ORDER — ACETAMINOPHEN 500 MG PO TABS
500.0000 mg | ORAL_TABLET | Freq: Four times a day (QID) | ORAL | Status: DC | PRN
  Administered 2022-03-02 – 2022-03-04 (×5): 1000 mg via ORAL
  Administered 2022-03-04: 500 mg via ORAL
  Administered 2022-03-05: 1000 mg via ORAL
  Administered 2022-03-05: 500 mg via ORAL
  Administered 2022-03-06 – 2022-03-10 (×5): 1000 mg via ORAL
  Administered 2022-03-11 – 2022-03-12 (×2): 500 mg via ORAL
  Filled 2022-03-02: qty 1
  Filled 2022-03-02 (×2): qty 2
  Filled 2022-03-02: qty 1
  Filled 2022-03-02 (×3): qty 2
  Filled 2022-03-02: qty 1
  Filled 2022-03-02 (×6): qty 2
  Filled 2022-03-02: qty 1
  Filled 2022-03-02 (×2): qty 2

## 2022-03-02 MED ORDER — GERHARDT'S BUTT CREAM
TOPICAL_CREAM | Freq: Three times a day (TID) | CUTANEOUS | Status: DC
  Administered 2022-03-05 – 2022-03-07 (×2): 1 via TOPICAL
  Filled 2022-03-02 (×4): qty 1

## 2022-03-02 MED ORDER — SODIUM CHLORIDE 0.9 % IV SOLN
2.0000 g | INTRAVENOUS | Status: DC
  Administered 2022-03-03: 2 g via INTRAVENOUS
  Filled 2022-03-02: qty 12.5

## 2022-03-02 MED ORDER — HYDROMORPHONE HCL 1 MG/ML IJ SOLN
0.2500 mg | Freq: Once | INTRAMUSCULAR | Status: AC
  Administered 2022-03-02: 0.25 mg via INTRAVENOUS
  Filled 2022-03-02: qty 0.5

## 2022-03-02 MED ORDER — MELATONIN 5 MG PO TABS
5.0000 mg | ORAL_TABLET | Freq: Every evening | ORAL | Status: DC | PRN
  Administered 2022-03-02: 5 mg via ORAL
  Filled 2022-03-02: qty 1

## 2022-03-02 MED ORDER — SODIUM ZIRCONIUM CYCLOSILICATE 5 G PO PACK
5.0000 g | PACK | Freq: Once | ORAL | Status: AC
  Administered 2022-03-02: 5 g via ORAL
  Filled 2022-03-02: qty 1

## 2022-03-02 NOTE — Progress Notes (Signed)
PHARMACY NOTE:  ANTIMICROBIAL RENAL DOSAGE ADJUSTMENT  Current antimicrobial regimen includes a mismatch between antimicrobial dosage and estimated renal function.  As per policy approved by the Pharmacy & Therapeutics and Medical Executive Committees, the antimicrobial dosage will be adjusted accordingly.  Current antimicrobial dosage:  cefepime 2gm q12h  Indication: PNA  Renal Function:  Estimated Creatinine Clearance: 28.6 mL/min (A) (by C-G formula based on SCr of 1.41 mg/dL (H)). '[]'$      On intermittent HD, scheduled: '[]'$      On CRRT    Antimicrobial dosage has been changed to:  cefepime 2gm q24h   Thank you for allowing pharmacy to be a part of this patient's care.  Lynelle Doctor, [facility] 03/02/2022 7:58 AM

## 2022-03-02 NOTE — Assessment & Plan Note (Signed)
Secondary bacterial PNA in setting of flu. Continue Zosyn Monitor oxygenation, supplement if sats < 90% Supportive / symptomatic care per orders Follow cultures

## 2022-03-02 NOTE — Plan of Care (Signed)
  Problem: Activity: Goal: Risk for activity intolerance will decrease Outcome: Progressing   

## 2022-03-02 NOTE — Progress Notes (Addendum)
PROGRESS NOTE   Maria Fox  EYC:[id number] DOB: 02-01-1946 DOA: 02/23/2022 PCP: [name], MD  Brief Narrative:  76 year old white female home dwelling Known HTN HLD DM TY 2  prior CVA-multi infarcts and corona radiata during CABG-left PCA stroke 2017 Severe three-vessel CAD status post CABG 05/2014, subsequent PCI 11/13/2017 supposed to be 6 months dual antiplatelet DM TY 2 with stage ii CKD Known chronic NSVT since 2016 EF 45% echo 03/27/2014-- developed a flutter 2-1 in 06/2020 Left lower extremity DVT 2022 Peripheral neuropathy Prior buttock abscess status post incision and drainage 08/05/2020  Presented to ED 12/16 with a week long illness nausea vomiting diarrhea and weakness to the point of being unable to ambulate-found to have influenza A as well as dehydration Contact dermatitis on buttocks and perineum   Hospital-Problem based course  Influenza A Superimposed bacterial pneumonia- based on CXR 12/22 Lactic acidosis - Tamiflu completed 12/21 - Started on Zosyn 12/22 -- Increased sputum and is eating poorly  Lobar pneumonia - left basilar opacity, new on CXR 12/22, new compared to film on 12/16 on admission. Secondary bacterial PNA in setting of flu. Continue Zosyn Monitor oxygenation, supplement if sats < 90% Supportive / symptomatic care per orders Follow cultures  Hyperkalemia - mild, K 5.4 this AM -- 5 g Lokelma x 1 today -- Repeat BMP in AM  AKI superimposed on CKD 3 on admission -Continuing NS 75 cc/H, as not eating reliably - Hold Lasix 20 from PTA resume when she is tolerating food better  Nausea abdominal discomfort -passing gas well as stool placed back on Zofran which is a home med 3 times daily and see how she does  Klebsiella/E. coli found in urine - Received 3 doses ceftriaxone, transition to Keflex 500 twice daily - diarrhea seems to not be an issue currently--stopping MiraLAX and Colace  SVT superimposed on chronic A-fib 2-1 Underlying  CAD - Increased metoprolol to 37.5 XL and monitor trends -Continue apixaban 5 twice daily -Magnesium low s despite Mag-Ox 800 twice daily, adding 2 g of IV magnesium  Contact dermatitis -Barrier cream ordered as well as nystatin and monitor  Type 2 diabetes - last A1c 9.5, associated with diabetic neuropathy - Continue insulin sliding scale coverage, sugars reasonable below 180 and drop metformin dose to 500 daily with breakfast - Continue Neurontin 300 twice daily  HFpEF -Lasix held, 2D echo EF 60 to 65%  Prior strokes -Continue Plavix 75 daily  DVT prophylaxis: Eliquis Code Status: Full Family Communication: None present Disposition:  Status is: Inpatient Remains inpatient appropriate because:  Severity of illness with inadequate PO intake, very weak.  On IV antibiotics.   Ongoing evaluation.   Consultants:  None  Procedures: No   Antimicrobials: No   Subjective:  Sleeping and appears comfortable.  She responds briefly but keeps eyes closed, dose not speak but will nod/shake head yes or no.  Bedside RN this AM earlier reported ongoing complaint of dysuria but also has perineal rash.  Nurse also reported pt to have a headache earlier, medicated for that.  Having diarrhea, pt denies abdominal pain.    Objective: Vitals:   03/01/22 1345 03/01/22 2130 03/02/22 0500 03/02/22 0821  BP: (!) 143/73 (!) 154/80 137/72 (!) 144/61  Pulse: 84 71 68 77  Resp: '16 20 18   '$ Temp: 97.6 F (36.4 C) 97.6 F (36.4 C) (!) 97.5 F (36.4 C)   TempSrc:  Oral Oral   SpO2: 93% 99% 100%   Weight:  Height:        Intake/Output Summary (Last 24 hours) at 03/02/2022 1253 Last data filed at 03/02/2022 0600 Gross per 24 hour  Intake 1153.88 ml  Output 800 ml  Net 353.88 ml   Filed Weights   02/27/22 0500 02/28/22 0500 03/01/22 0451  Weight: 63.1 kg 59.5 kg 62.3 kg    Examination:  General exam: sleeping comfortable, responds briefly, no acute distress HEENT: keeps eyes  closed, hearing grossly normal  Respiratory system: CTAB diminished bases, no wheezes, rales or rhonchi, normal respiratory effort at rest on 2 L/min [place] O2. Cardiovascular system: normal S1/S2, RRR, no pedal edema.   Gastrointestinal system: soft, NT, ND Central nervous system: no gross focal neurologic deficits, non-verbal Extremities: no edema, normal tone Skin: dry, intact, normal temperature Psychiatry: unable to assess due to somnolence    Data Reviewed: personally reviewed   CBC    Component Value Date/Time   WBC 6.1 03/02/2022 0405   RBC 3.57 (L) 03/02/2022 0405   HGB 10.7 (L) 03/02/2022 0405   HGB 13.5 01/28/2020 1532   HCT 33.5 (L) 03/02/2022 0405   HCT 40.0 01/28/2020 1532   PLT 197 03/02/2022 0405   PLT 226 01/28/2020 1532   MCV 93.8 03/02/2022 0405   MCV 88 01/28/2020 1532   MCH 30.0 03/02/2022 0405   MCHC 31.9 03/02/2022 0405   RDW 13.9 03/02/2022 0405   RDW 13.3 01/28/2020 1532   LYMPHSABS 1.6 02/23/2022 1550   MONOABS 0.4 02/23/2022 1550   EOSABS 0.1 02/23/2022 1550   BASOSABS 0.0 02/23/2022 1550      Latest Ref Rng & Units 03/02/2022    4:05 AM 03/01/2022    3:23 AM 02/28/2022    3:13 AM  CMP  Glucose 70 - 99 mg/dL 145  130  104   BUN 8 - 23 mg/dL 5  5  <5   Creatinine 0.44 - 1.00 mg/dL 1.41  0.94  0.89   Sodium 135 - 145 mmol/L 142  141  138   Potassium 3.5 - 5.1 mmol/L 5.4  3.9  4.0   Chloride 98 - 111 mmol/L 117  114  113   CO2 22 - 32 mmol/L '19  20  19   '$ Calcium 8.9 - 10.3 mg/dL 8.2  8.1  7.9   Total Protein 6.5 - 8.1 g/dL 4.9  5.6  5.6   Total Bilirubin 0.3 - 1.2 mg/dL 0.4  0.8  0.5   Alkaline Phos 38 - 126 U/L 61  55  51   AST 15 - 41 U/L '12  14  14   '$ ALT 0 - 44 U/L '11  11  12      '$ Radiology Studies: DG Chest 2 View  Result Date: 03/01/2022 CLINICAL DATA:  Pneumonia. EXAM: CHEST - 2 VIEW COMPARISON:  February 23, 2022. FINDINGS: The heart size and mediastinal contours are within normal limits. Sternotomy wires are noted. Right lung is  clear. Mild left basilar subsegmental atelectasis or infiltrate is noted with probable small left pleural effusion. The visualized skeletal structures are unremarkable. IMPRESSION: Left basilar opacity as described above. Electronically Signed   By: Marijo Conception M.D.   On: 03/01/2022 11:48     Scheduled Meds:  apixaban  5 mg Oral BID   atorvastatin  40 mg Oral QPC supper   clopidogrel  75 mg Oral Daily   DULoxetine  30 mg Oral Daily   gabapentin  300 mg Oral BID   insulin aspart  0-15 Units Subcutaneous TID WC   isosorbide mononitrate  15 mg Oral Q breakfast   magnesium oxide  800 mg Oral BID   metFORMIN  500 mg Oral Q breakfast   metoprolol succinate  37.5 mg Oral Daily   nystatin   Topical BID   pantoprazole  40 mg Oral BID AC   silver sulfADIAZINE   Topical Daily   zinc oxide   Topical BID   Continuous Infusions:  sodium chloride 75 mL/hr at 03/02/22 0544   [START ON 03/03/2022] ceFEPime (MAXIPIME) IV     metronidazole 500 mg (03/01/22 1844)   sodium chloride Stopped (02/24/22 1129)     LOS: 7 days   Time spent: [place], DO [facility] To contact the attending provider between 7A-7P or the covering provider during after hours 7P-7A, please log into the web site [email or web address] and access using universal [place] password for that web site. If you do not have the password, please call the hospital operator.  03/02/2022, 12:53 PM

## 2022-03-03 DIAGNOSIS — J101 Influenza due to other identified influenza virus with other respiratory manifestations: Secondary | ICD-10-CM | POA: Diagnosis not present

## 2022-03-03 DIAGNOSIS — J181 Lobar pneumonia, unspecified organism: Secondary | ICD-10-CM | POA: Diagnosis not present

## 2022-03-03 DIAGNOSIS — E1149 Type 2 diabetes mellitus with other diabetic neurological complication: Secondary | ICD-10-CM | POA: Diagnosis not present

## 2022-03-03 DIAGNOSIS — E860 Dehydration: Secondary | ICD-10-CM | POA: Diagnosis not present

## 2022-03-03 LAB — GLUCOSE, CAPILLARY
Glucose-Capillary: 116 mg/dL — ABNORMAL HIGH (ref 70–99)
Glucose-Capillary: 139 mg/dL — ABNORMAL HIGH (ref 70–99)
Glucose-Capillary: 95 mg/dL (ref 70–99)
Glucose-Capillary: 90 mg/dL (ref 70–99)

## 2022-03-03 LAB — BASIC METABOLIC PANEL
Anion gap: 3 — ABNORMAL LOW (ref 5–15)
BUN: 7 mg/dL — ABNORMAL LOW (ref 8–23)
CO2: 20 mmol/L — ABNORMAL LOW (ref 22–32)
Calcium: 8 mg/dL — ABNORMAL LOW (ref 8.9–10.3)
Chloride: 115 mmol/L — ABNORMAL HIGH (ref 98–111)
Creatinine, Ser: 1.12 mg/dL — ABNORMAL HIGH (ref 0.44–1.00)
GFR, Estimated: 50 mL/min — ABNORMAL LOW (ref >60)
Glucose, Bld: 145 mg/dL — ABNORMAL HIGH (ref 70–99)
Potassium: 4.8 mmol/L (ref 3.5–5.1)
Sodium: 138 mmol/L (ref 135–145)

## 2022-03-03 LAB — CBC
HCT: 31.3 % — ABNORMAL LOW (ref 36.0–46.0)
Hemoglobin: 9.8 g/dL — ABNORMAL LOW (ref 12.0–15.0)
MCH: 29.5 pg (ref 26.0–34.0)
MCHC: 31.3 g/dL (ref 30.0–36.0)
MCV: 94.3 fL (ref 80.0–100.0)
Platelets: 191 10*3/uL (ref 150–400)
RBC: 3.32 MIL/uL — ABNORMAL LOW (ref 3.87–5.11)
RDW: 14.1 % (ref 11.5–15.5)
WBC: 6.4 10*3/uL (ref 4.0–10.5)
nRBC: 0 % (ref 0.0–0.2)

## 2022-03-03 MED ORDER — LIP MEDEX EX OINT
TOPICAL_OINTMENT | CUTANEOUS | Status: DC | PRN
  Administered 2022-03-03: 75 via TOPICAL
  Filled 2022-03-03: qty 7

## 2022-03-03 MED ORDER — MELATONIN 5 MG PO TABS
5.0000 mg | ORAL_TABLET | Freq: Every day | ORAL | Status: DC
  Administered 2022-03-03 – 2022-03-11 (×9): 5 mg via ORAL
  Filled 2022-03-03 (×9): qty 1

## 2022-03-03 MED ORDER — SODIUM CHLORIDE 0.9 % IV SOLN
2.0000 g | Freq: Two times a day (BID) | INTRAVENOUS | Status: DC
  Administered 2022-03-03 – 2022-03-07 (×8): 2 g via INTRAVENOUS
  Filled 2022-03-03 (×8): qty 12.5

## 2022-03-03 NOTE — Progress Notes (Signed)
Occupational Therapy Treatment Patient Details Name: Maria Fox MRN: [id number] DOB: 1945/08/24 Today's Date: 03/03/2022   History of present illness Patient is a 76 year old female who presented with a week history of nausea and vomiting with diarrhea. Patient was admitted with influenza A, dehydration, contact dermatitis. PMH: CKD II, DM, CVA 2016, CABG, L PCA stroke 2017, COVID 19, RBBB, autoimmune deficiency syndrome.   OT comments  Patient declined to move from current position in bed reporting that she was not in pain and did not want to irritate her bottom. Patient was educated on proper positioning to off load pressure on bottom. Patient verbalized understanding. Patient engaged in there ex to maintain BUE strength with yellow thera band at bed level. OT will need to reassess next session to see if d/c plans need to be updated further. Concerns over patients lack of time out of bed. Patient would continue to benefit from skilled OT services at this time while admitted and after d/c to address noted deficits in order to improve overall safety and independence in ADLs.     Recommendations for follow up therapy are one component of a multi-disciplinary discharge planning process, led by the attending physician.  Recommendations may be updated based on patient status, additional functional criteria and insurance authorization.    Follow Up Recommendations  Home health OT     Assistance Recommended at Discharge Frequent or constant Supervision/Assistance  Patient can return home with the following  A little help with walking and/or transfers;A lot of help with bathing/dressing/bathroom;Assistance with cooking/housework;Help with stairs or ramp for entrance;Assist for transportation   Equipment Recommendations  BSC/3in1       Precautions / Restrictions Precautions Precautions: Fall Precaution Comments: incontinent (bladder, bowel); dizziness Restrictions Weight Bearing Restrictions:  No        ADL either performed or assessed with clinical judgement   ADL         General ADL Comments: patient declined to move from supine position EOB with increased education provided on importance of movement, paitent reported that she was in no pain at this time and did not want to change that.      Cognition Arousal/Alertness: Awake/alert Behavior During Therapy: WFL for tasks assessed/performed Overall Cognitive Status: Within Functional Limits for tasks assessed              Exercises General Exercises - Upper Extremity Shoulder Flexion: Supine, Theraband, 10 reps (bilaterally) Theraband Level (Shoulder Flexion): Level 1 (Yellow) Elbow Flexion: 15 reps, Theraband, Strengthening (bilaterally) Theraband Level (Elbow Flexion): Level 1 (Yellow)            Pertinent Vitals/ Pain       Pain Assessment Pain Assessment: Faces Faces Pain Scale: No hurt Pain Location: buttocks Pain Intervention(s): Other (comment) (patient reported feeling good at this time with current positioning)         Frequency  Min 2X/week        Progress Toward Goals  OT Goals(current goals can now be found in the care plan section)  Progress towards OT goals: Not progressing toward goals - comment (patients pain and discomfort impacting movement at this time. need to fully reasses patient for appropirate recommmendations for d/c.)     Plan Discharge plan remains appropriate       AM-PAC OT "6 Clicks" Daily Activity     Outcome Measure   Help from another person eating meals?: None Help from another person taking care of personal grooming?: A Little Help from  another person toileting, which includes using toliet, bedpan, or urinal?: Total Help from another person bathing (including washing, rinsing, drying)?: A Lot Help from another person to put on and taking off regular upper body clothing?: A Lot Help from another person to put on and taking off regular lower body  clothing?: Total 6 Click Score: 13    End of Session Equipment Utilized During Treatment: Other (comment) (yellow theraband)  OT Visit Diagnosis: Muscle weakness (generalized) (M62.81);Pain   Activity Tolerance Patient limited by pain   Patient Left in bed;with call bell/phone within reach   Nurse Communication Other (comment) (ok to see)        Time: 6546-5035 OT Time Calculation (min): 12 min  Charges: OT General Charges $OT Visit: 1 Visit OT Treatments $Therapeutic Activity: 8-22 mins  Rennie Plowman, MS Acute Rehabilitation Department Office# [phone]   Willa Rough 03/03/2022, 2:59 PM

## 2022-03-03 NOTE — Progress Notes (Signed)
PROGRESS NOTE   Maria Fox  PJK:[id number] DOB: 15-Mar-1945 DOA: 02/23/2022 PCP: [name], MD  Brief Narrative:  76 year old white female home dwelling Known HTN HLD DM TY 2  prior CVA-multi infarcts and corona radiata during CABG-left PCA stroke 2017 Severe three-vessel CAD status post CABG 05/2014, subsequent PCI 11/13/2017 supposed to be 6 months dual antiplatelet DM TY 2 with stage ii CKD Known chronic NSVT since 2016 EF 45% echo 03/27/2014-- developed a flutter 2-1 in 06/2020 Left lower extremity DVT 2022 Peripheral neuropathy Prior buttock abscess status post incision and drainage 08/05/2020  Presented to ED 12/16 with a week long illness nausea vomiting diarrhea and weakness to the point of being unable to ambulate-found to have influenza A as well as dehydration Contact dermatitis on buttocks and perineum   Hospital-Problem based course  Influenza A Superimposed bacterial pneumonia- based on CXR 12/22 Lactic acidosis - Tamiflu completed 12/21 - Started on Zosyn 12/22 -- Increased sputum and is eating poorly  Lobar pneumonia - left basilar opacity, new on CXR 12/22, new compared to film on 12/16 on admission. Secondary bacterial PNA in setting of flu. Continue Zosyn Monitor oxygenation, supplement if sats < 90% Supportive / symptomatic care per orders Follow cultures  Hyperkalemia - mild, K 5.4 >> 4.8 this AM, 5 g Lokelma x 1 given yesterday -- Repeat BMP in AM  AKI superimposed on CKD 3 on admission -Continuing NS 75 cc/H, remains with poor PO intake - Hold Lasix 20 from PTA resume when she is tolerating food better  Nausea abdominal discomfort -passing gas well as stool placed back on Zofran which is a home med 3 times daily and see how she does  Klebsiella/E. coli found in urine - Received 3 doses ceftriaxone, transition to Keflex 500 twice daily  SVT superimposed on chronic A-fib 2-1 Underlying CAD - Increased metoprolol to 37.5 XL   --Telemetry -Continue apixaban 5 twice daily -Monitor and replace K, Mg  Contact dermatitis -Barrier cream ordered as well as nystatin and monitor  Type 2 diabetes - last A1c 9.5, associated with diabetic neuropathy - Continue insulin sliding scale coverage, sugars reasonable below 180 and drop metformin dose to 500 daily with breakfast - Continue Neurontin 300 twice daily  HFpEF -Lasix held, 2D echo EF 60 to 65%  Prior strokes -Continue Plavix 75 daily  DVT prophylaxis: Eliquis Code Status: Full Family Communication: None present Disposition:  Status is: Inpatient Remains inpatient appropriate because:  Severity of illness with inadequate PO intake, very weak.  On IV antibiotics.   Ongoing evaluation.   Consultants:  None  Procedures: No   Antimicrobials: No   Subjective:  Pt was sleeping but woke easily to voice, was able to stay awake and engaged. She reports feeling awful / lowsy.  Mostly very tired.  Skin on her bottom continues to be raw and painful.  No other acute complaints.    Objective: Vitals:   03/02/22 0821 03/02/22 2100 03/03/22 0500 03/03/22 0942  BP: (!) 144/61 133/69  136/82  Pulse: 77 67  70  Resp:  18  16  Temp:  (!) 97.4 F (36.3 C)  97.9 F (36.6 C)  TempSrc:  Oral  Oral  SpO2:  100%  100%  Weight:   62.3 kg   Height:        Intake/Output Summary (Last 24 hours) at 03/03/2022 1242 Last data filed at 03/03/2022 1000 Gross per 24 hour  Intake 2189.88 ml  Output --  Net 2189.88 ml  Filed Weights   02/28/22 0500 03/01/22 0451 03/03/22 0500  Weight: 59.5 kg 62.3 kg 62.3 kg    Examination:  General exam: sleeping woke to voice, no acute distress HEENT: moist mucus membranes, hearing grossly normal  Respiratory system: CTAB diminished bases, no wheezes, rales or rhonchi, normal respiratory effort at rest on 2 L/min [place] O2. Cardiovascular system: normal S1/S2, RRR, no pedal edema.   Gastrointestinal system: soft, NT, ND Central  nervous system: no gross focal neurologic deficits, non-verbal Extremities: no edema, normal tone Skin: dry, intact, normal temperature Psychiatry: unable to assess due to somnolence    Data Reviewed: personally reviewed   CBC    Component Value Date/Time   WBC 6.4 03/03/2022 0342   RBC 3.32 (L) 03/03/2022 0342   HGB 9.8 (L) 03/03/2022 0342   HGB 13.5 01/28/2020 1532   HCT 31.3 (L) 03/03/2022 0342   HCT 40.0 01/28/2020 1532   PLT 191 03/03/2022 0342   PLT 226 01/28/2020 1532   MCV 94.3 03/03/2022 0342   MCV 88 01/28/2020 1532   MCH 29.5 03/03/2022 0342   MCHC 31.3 03/03/2022 0342   RDW 14.1 03/03/2022 0342   RDW 13.3 01/28/2020 1532   LYMPHSABS 1.6 02/23/2022 1550   MONOABS 0.4 02/23/2022 1550   EOSABS 0.1 02/23/2022 1550   BASOSABS 0.0 02/23/2022 1550      Latest Ref Rng & Units 03/03/2022    3:42 AM 03/02/2022    4:05 AM 03/01/2022    3:23 AM  CMP  Glucose 70 - 99 mg/dL 145  145  130   BUN 8 - 23 mg/dL '7  5  5   '$ Creatinine 0.44 - 1.00 mg/dL 1.12  1.41  0.94   Sodium 135 - 145 mmol/L 138  142  141   Potassium 3.5 - 5.1 mmol/L 4.8  5.4  3.9   Chloride 98 - 111 mmol/L 115  117  114   CO2 22 - 32 mmol/L '20  19  20   '$ Calcium 8.9 - 10.3 mg/dL 8.0  8.2  8.1   Total Protein 6.5 - 8.1 g/dL  4.9  5.6   Total Bilirubin 0.3 - 1.2 mg/dL  0.4  0.8   Alkaline Phos 38 - 126 U/L  61  55   AST 15 - 41 U/L  12  14   ALT 0 - 44 U/L  11  11      Radiology Studies: No results found.   Scheduled Meds:  apixaban  5 mg Oral BID   atorvastatin  40 mg Oral QPC supper   clopidogrel  75 mg Oral Daily   DULoxetine  30 mg Oral Daily   gabapentin  300 mg Oral BID   Gerhardt's butt cream   Topical TID   insulin aspart  0-15 Units Subcutaneous TID WC   isosorbide mononitrate  15 mg Oral Q breakfast   metFORMIN  500 mg Oral Q breakfast   metoprolol succinate  37.5 mg Oral Daily   nystatin   Topical BID   pantoprazole  40 mg Oral BID AC   silver sulfADIAZINE   Topical Daily    zinc oxide   Topical BID   Continuous Infusions:  sodium chloride 75 mL/hr at 03/03/22 0953   ceFEPime (MAXIPIME) IV     metronidazole 500 mg (03/03/22 0956)   sodium chloride Stopped (02/24/22 1129)     LOS: 8 days   Time spent: [place], DO [facility] To contact  the attending provider between 7A-7P or the covering provider during after hours 7P-7A, please log into the web site [email or web address] and access using universal [place] password for that web site. If you do not have the password, please call the hospital operator.  03/03/2022, 12:42 PM

## 2022-03-03 NOTE — Progress Notes (Signed)
PHARMACY NOTE:  ANTIMICROBIAL RENAL DOSAGE ADJUSTMENT  Current antimicrobial regimen includes a mismatch between antimicrobial dosage and estimated renal function.  As per policy approved by the Pharmacy & Therapeutics and Medical Executive Committees, the antimicrobial dosage will be adjusted accordingly.  Current antimicrobial dosage:  cefepime 2gm q24h  Indication: PNA  Renal Function:  Estimated Creatinine Clearance: 35.9 mL/min (A) (by C-G formula based on SCr of 1.12 mg/dL (H)). '[]'$      On intermittent HD, scheduled: '[]'$      On CRRT    Antimicrobial dosage has been changed to:  cefepime 2gm q12h   Thank you for allowing pharmacy to be a part of this patient's care.  Lynelle Doctor, [facility] 03/03/2022 9:22 AM

## 2022-03-03 NOTE — Plan of Care (Signed)
  Problem: Pain Managment: Goal: General experience of comfort will improve Outcome: Progressing   

## 2022-03-04 DIAGNOSIS — J181 Lobar pneumonia, unspecified organism: Secondary | ICD-10-CM | POA: Diagnosis not present

## 2022-03-04 DIAGNOSIS — E860 Dehydration: Secondary | ICD-10-CM | POA: Diagnosis not present

## 2022-03-04 DIAGNOSIS — J101 Influenza due to other identified influenza virus with other respiratory manifestations: Secondary | ICD-10-CM | POA: Diagnosis not present

## 2022-03-04 LAB — BASIC METABOLIC PANEL
Anion gap: 3 — ABNORMAL LOW (ref 5–15)
BUN: 6 mg/dL — ABNORMAL LOW (ref 8–23)
CO2: 21 mmol/L — ABNORMAL LOW (ref 22–32)
Calcium: 8.3 mg/dL — ABNORMAL LOW (ref 8.9–10.3)
Chloride: 115 mmol/L — ABNORMAL HIGH (ref 98–111)
Creatinine, Ser: 1.15 mg/dL — ABNORMAL HIGH (ref 0.44–1.00)
GFR, Estimated: 49 mL/min — ABNORMAL LOW (ref >60)
Glucose, Bld: 102 mg/dL — ABNORMAL HIGH (ref 70–99)
Potassium: 4.6 mmol/L (ref 3.5–5.1)
Sodium: 139 mmol/L (ref 135–145)

## 2022-03-04 LAB — GLUCOSE, CAPILLARY
Glucose-Capillary: 124 mg/dL — ABNORMAL HIGH (ref 70–99)
Glucose-Capillary: 112 mg/dL — ABNORMAL HIGH (ref 70–99)
Glucose-Capillary: 118 mg/dL — ABNORMAL HIGH (ref 70–99)
Glucose-Capillary: 93 mg/dL (ref 70–99)

## 2022-03-04 LAB — CBC
HCT: 35.4 % — ABNORMAL LOW (ref 36.0–46.0)
Hemoglobin: 11.2 g/dL — ABNORMAL LOW (ref 12.0–15.0)
MCH: 29.6 pg (ref 26.0–34.0)
MCHC: 31.6 g/dL (ref 30.0–36.0)
MCV: 93.4 fL (ref 80.0–100.0)
Platelets: 231 10*3/uL (ref 150–400)
RBC: 3.79 MIL/uL — ABNORMAL LOW (ref 3.87–5.11)
RDW: 14.1 % (ref 11.5–15.5)
WBC: 6.1 10*3/uL (ref 4.0–10.5)
nRBC: 0 % (ref 0.0–0.2)

## 2022-03-04 LAB — MAGNESIUM: Magnesium: 1.9 mg/dL (ref 1.7–2.4)

## 2022-03-04 NOTE — Progress Notes (Signed)
PROGRESS NOTE   Maria Fox  NLZ:[id number] DOB: 1945-07-18 DOA: 02/23/2022 PCP: [name], MD  Brief Narrative:  76 year old white female home dwelling Known HTN HLD DM TY 2  prior CVA-multi infarcts and corona radiata during CABG-left PCA stroke 2017 Severe three-vessel CAD status post CABG 05/2014, subsequent PCI 11/13/2017 supposed to be 6 months dual antiplatelet DM TY 2 with stage ii CKD Known chronic NSVT since 2016 EF 45% echo 03/27/2014-- developed a flutter 2-1 in 06/2020 Left lower extremity DVT 2022 Peripheral neuropathy Prior buttock abscess status post incision and drainage 08/05/2020  Presented to ED 12/16 with a week long illness nausea vomiting diarrhea and weakness to the point of being unable to ambulate-found to have influenza A as well as dehydration Contact dermatitis on buttocks and perineum   Hospital-Problem based course  Influenza A Superimposed bacterial pneumonia- based on CXR 12/22 Lactic acidosis - Tamiflu completed 12/21 - Started on Cefepime/Flagyl 12/22 -- Increased sputum and is eating poorly  Lobar pneumonia - left basilar opacity, new on CXR 12/22, new compared to film on 12/16 on admission. Secondary bacterial PNA in setting of flu. Continue Cefepime/Flagyl  Monitor oxygenation, supplement if sats < 90% Supportive / symptomatic care per orders Follow cultures  Hyperkalemia - mild, K 5.4 >> 4.8 this AM, 5 g Lokelma x 1 given yesterday -- Repeat BMP in AM  AKI superimposed on CKD 3 on admission -Continuing NS 75 cc/H, remains with poor PO intake - Hold Lasix 20 from PTA resume when she is tolerating food better  Nausea abdominal discomfort -passing gas well as stool placed back on Zofran which is a home med 3 times daily and see how she does --IV Compazine PRN N/V  Klebsiella/E. coli found in urine - Received 3 doses ceftriaxone, transition to Keflex 500 twice daily  SVT superimposed on chronic A-fib 2-1 Underlying CAD -  Increased metoprolol to 37.5 XL  --Telemetry -Continue apixaban 5 twice daily -Monitor and replace K, Mg  Contact dermatitis -Barrier cream ordered as well as nystatin and monitor  Type 2 diabetes - last A1c 9.5, associated with diabetic neuropathy - Continue insulin sliding scale coverage, sugars reasonable below 180 and drop metformin dose to 500 daily with breakfast - Continue Neurontin 300 twice daily  HFpEF -Lasix held, 2D echo EF 60 to 65%  Prior strokes -Continue Plavix 75 daily  DVT prophylaxis: Eliquis Code Status: Full Family Communication: None present Disposition:  Status is: Inpatient Remains inpatient appropriate because:  Severity of illness with inadequate PO intake, very weak.  On IV antibiotics.   Ongoing evaluation.   Consultants:  None  Procedures: No   Antimicrobials: No   Subjective:  Pt was resting with eyes closed but responsive this AM.  She reports feeling awful today, having a lot of nausea, was vomiting earlier.  Med history reviewed, given Compazine about an hour earlier, dissovling Zofran before that.  She notes some improvement but not resolved.  Still not eating or drinking very much.    Objective: Vitals:   03/02/22 0821 03/02/22 2100 03/03/22 0500 03/03/22 0942  BP: (!) 144/61 133/69  136/82  Pulse: 77 67  70  Resp:  18  16  Temp:  (!) 97.4 F (36.3 C)  97.9 F (36.6 C)  TempSrc:  Oral  Oral  SpO2:  100%  100%  Weight:   62.3 kg   Height:        Intake/Output Summary (Last 24 hours) at 03/03/2022 1242 Last data filed  at 03/03/2022 1000 Gross per 24 hour  Intake 2189.88 ml  Output --  Net 2189.88 ml   Filed Weights   02/28/22 0500 03/01/22 0451 03/03/22 0500  Weight: 59.5 kg 62.3 kg 62.3 kg    Examination:  General exam: sleeping woke to voice, no acute distress HEENT: keeps eyes closed, moist mucus membranes, hearing grossly normal  Respiratory system: lungs clear with diminished bases, no wheezes or rhonchi,  normal respiratory effort at rest on 2 L/min [place] O2. Cardiovascular system: normal S1/S2, RRR, no pedal edema.   Gastrointestinal system: soft, NT, ND Central nervous system: no gross focal neurologic deficits, non-verbal Extremities: no edema, normal tone Skin: dry, intact, normal temperature Psychiatry: normal mood, flat affect    Data Reviewed: personally reviewed   CBC    Component Value Date/Time   WBC 6.4 03/03/2022 0342   RBC 3.32 (L) 03/03/2022 0342   HGB 9.8 (L) 03/03/2022 0342   HGB 13.5 01/28/2020 1532   HCT 31.3 (L) 03/03/2022 0342   HCT 40.0 01/28/2020 1532   PLT 191 03/03/2022 0342   PLT 226 01/28/2020 1532   MCV 94.3 03/03/2022 0342   MCV 88 01/28/2020 1532   MCH 29.5 03/03/2022 0342   MCHC 31.3 03/03/2022 0342   RDW 14.1 03/03/2022 0342   RDW 13.3 01/28/2020 1532   LYMPHSABS 1.6 02/23/2022 1550   MONOABS 0.4 02/23/2022 1550   EOSABS 0.1 02/23/2022 1550   BASOSABS 0.0 02/23/2022 1550      Latest Ref Rng & Units 03/03/2022    3:42 AM 03/02/2022    4:05 AM 03/01/2022    3:23 AM  CMP  Glucose 70 - 99 mg/dL 145  145  130   BUN 8 - 23 mg/dL '7  5  5   '$ Creatinine 0.44 - 1.00 mg/dL 1.12  1.41  0.94   Sodium 135 - 145 mmol/L 138  142  141   Potassium 3.5 - 5.1 mmol/L 4.8  5.4  3.9   Chloride 98 - 111 mmol/L 115  117  114   CO2 22 - 32 mmol/L '20  19  20   '$ Calcium 8.9 - 10.3 mg/dL 8.0  8.2  8.1   Total Protein 6.5 - 8.1 g/dL  4.9  5.6   Total Bilirubin 0.3 - 1.2 mg/dL  0.4  0.8   Alkaline Phos 38 - 126 U/L  61  55   AST 15 - 41 U/L  12  14   ALT 0 - 44 U/L  11  11      Radiology Studies: No results found.   Scheduled Meds:  apixaban  5 mg Oral BID   atorvastatin  40 mg Oral QPC supper   clopidogrel  75 mg Oral Daily   DULoxetine  30 mg Oral Daily   gabapentin  300 mg Oral BID   Gerhardt's butt cream   Topical TID   insulin aspart  0-15 Units Subcutaneous TID WC   isosorbide mononitrate  15 mg Oral Q breakfast   metFORMIN  500 mg Oral Q breakfast    metoprolol succinate  37.5 mg Oral Daily   nystatin   Topical BID   pantoprazole  40 mg Oral BID AC   silver sulfADIAZINE   Topical Daily   zinc oxide   Topical BID   Continuous Infusions:  sodium chloride 75 mL/hr at 03/03/22 0953   ceFEPime (MAXIPIME) IV     metronidazole 500 mg (03/03/22 0956)   sodium chloride Stopped (02/24/22  1129)     LOS: 8 days   Time spent: [place], DO [facility] To contact the attending provider between 7A-7P or the covering provider during after hours 7P-7A, please log into the web site [email or web address] and access using universal [place] password for that web site. If you do not have the password, please call the hospital operator.  03/03/2022, 12:42 PM

## 2022-03-05 DIAGNOSIS — E860 Dehydration: Secondary | ICD-10-CM | POA: Diagnosis not present

## 2022-03-05 DIAGNOSIS — I5032 Chronic diastolic (congestive) heart failure: Secondary | ICD-10-CM | POA: Diagnosis not present

## 2022-03-05 DIAGNOSIS — J101 Influenza due to other identified influenza virus with other respiratory manifestations: Secondary | ICD-10-CM | POA: Diagnosis not present

## 2022-03-05 DIAGNOSIS — J181 Lobar pneumonia, unspecified organism: Secondary | ICD-10-CM | POA: Diagnosis not present

## 2022-03-05 LAB — BASIC METABOLIC PANEL
Anion gap: 5 (ref 5–15)
BUN: 8 mg/dL (ref 8–23)
CO2: 23 mmol/L (ref 22–32)
Calcium: 8.4 mg/dL — ABNORMAL LOW (ref 8.9–10.3)
Chloride: 111 mmol/L (ref 98–111)
Creatinine, Ser: 1.09 mg/dL — ABNORMAL HIGH (ref 0.44–1.00)
GFR, Estimated: 52 mL/min — ABNORMAL LOW (ref >60)
Glucose, Bld: 87 mg/dL (ref 70–99)
Potassium: 4.1 mmol/L (ref 3.5–5.1)
Sodium: 139 mmol/L (ref 135–145)

## 2022-03-05 LAB — GLUCOSE, CAPILLARY
Glucose-Capillary: 88 mg/dL (ref 70–99)
Glucose-Capillary: 112 mg/dL — ABNORMAL HIGH (ref 70–99)
Glucose-Capillary: 154 mg/dL — ABNORMAL HIGH (ref 70–99)
Glucose-Capillary: 155 mg/dL — ABNORMAL HIGH (ref 70–99)

## 2022-03-05 MED ORDER — BUTALBITAL-APAP-CAFFEINE 50-325-40 MG PO TABS
1.0000 | ORAL_TABLET | Freq: Four times a day (QID) | ORAL | Status: DC | PRN
  Administered 2022-03-05 – 2022-03-10 (×8): 1 via ORAL
  Filled 2022-03-05 (×8): qty 1

## 2022-03-05 MED ORDER — DEXTROSE IN LACTATED RINGERS 5 % IV SOLN: INTRAVENOUS | Status: DC

## 2022-03-05 MED ORDER — ONDANSETRON 4 MG PO TBDP
8.0000 mg | ORAL_TABLET | Freq: Three times a day (TID) | ORAL | Status: DC
  Administered 2022-03-05 – 2022-03-06 (×3): 8 mg via ORAL
  Filled 2022-03-05 (×3): qty 2

## 2022-03-05 NOTE — Progress Notes (Signed)
OT Cancellation Note  Patient Details Name: Maria Fox MRN: [id number] DOB: 1945-09-08   Cancelled Treatment:    Reason Eval/Treat Not Completed: Patient declined, no reason specified Patient declined therapy today reporting she was too nauseated to move. Patient was educated on importance of movement later on. Patient declined. OT to continue to follow and check back as schedule will allow.  Rennie Plowman, MS Acute Rehabilitation Department Office# [phone]  03/05/2022, 11:28 AM

## 2022-03-05 NOTE — Progress Notes (Signed)
PT Cancellation Note  Patient Details Name: Maria Fox MRN: [id number] DOB: May 29, 1945   Cancelled Treatment:     per RN NAUSEA in am so did not attempt to see.  Per chart review, pt declined her OT.  Will attempt to see in am to address [facility] needs.  Rica Koyanagi  PTA Acute  Rehabilitation Services Office M-F          [phone] Weekend pager [phone]

## 2022-03-05 NOTE — Progress Notes (Signed)
PROGRESS NOTE   Maria Fox  BSW:[id number] DOB: Sep 15, 1945 DOA: 02/23/2022 PCP: [name], MD  Brief Narrative:  76 year old white female home dwelling Known HTN HLD DM TY 2  prior CVA-multi infarcts and corona radiata during CABG-left PCA stroke 2017 Severe three-vessel CAD status post CABG 05/2014, subsequent PCI 11/13/2017 supposed to be 6 months dual antiplatelet DM TY 2 with stage ii CKD Known chronic NSVT since 2016 EF 45% echo 03/27/2014-- developed a flutter 2-1 in 06/2020 Left lower extremity DVT 2022 Peripheral neuropathy Prior buttock abscess status post incision and drainage 08/05/2020  Presented to ED 12/16 with a week long illness nausea vomiting diarrhea and weakness to the point of being unable to ambulate-found to have influenza A as well as dehydration Contact dermatitis on buttocks and perineum  Hospital course complicated by development of superimposed bacterial pneumonia, treated with IV antibiotics.  Also with ongoing nausea vomiting and very poor p.o. intake, remaining on IV fluids.   Hospital-Problem based course  Influenza A Superimposed bacterial pneumonia- based on CXR 12/22 Lactic acidosis -resolved - Tamiflu completed 12/21 -- Started on Cefepime/Flagyl 12/22 -- Increased sputum and is eating poorly  Lobar pneumonia - left basilar opacity, new on CXR 12/22, new compared to film on 12/16 on admission. Secondary bacterial PNA in setting of flu. Continue Cefepime/Flagyl  Monitor oxygenation, supplement if sats < 90% Supportive / symptomatic care per orders Follow cultures  Hyperkalemia -resolved.   Mild, K 5.4 >> 4.8 >>4.6 >>4.1 this AM --Treated with Lokelma -- Repeat BMP in AM  AKI superimposed on CKD 3 on admission - Continue maintenance IV fluids, remains with poor PO intake - Hold Lasix 20 from PTA resume when she is tolerating food better  Nausea abdominal discomfort -Zofran ODT appears is a home med TID PRN --IV Compazine PRN  N/V -- 12/26: Will schedule Zofran ODT's every 8 hours given persistent nausea despite as needed meds.  ?  If nausea related to antibiotics  Inadequate p.o. intake -due to persistent nausea. Change IV fluids to D5/LR. Scheduled Zofran ODT to hopefully control nausea better  Headache -Fioricet as needed if headache not responsive to Tylenol  Klebsiella/E. coli found in urine - Received 3 doses ceftriaxone, transition to Keflex 500 twice daily  SVT superimposed on chronic A-fib 2-1 Underlying CAD - Increased metoprolol to 37.5 XL  --Telemetry -Continue apixaban 5 twice daily -Monitor and replace K, Mg  Contact dermatitis -Barrier cream ordered as well as nystatin and monitor  Type 2 diabetes - last A1c 9.5, associated with diabetic neuropathy - Continue insulin sliding scale coverage, sugars reasonable below 180 and drop metformin dose to 500 daily with breakfast - Continue Neurontin 300 twice daily  HFpEF -Lasix held, 2D echo EF 60 to 65%  Prior strokes -Continue Plavix 75 daily  DVT prophylaxis: Eliquis Code Status: Full Family Communication: None present Disposition:  Status is: Inpatient Remains inpatient appropriate because:  Severity of illness with inadequate PO intake, very weak.  On IV antibiotics.   Ongoing evaluation.   Consultants:  None  Procedures: No   Antimicrobials: No   Subjective:  Pt was resting in bed, appeared sleeping but responded to voice.  She reports still feeling awful today with ongoing nausea, vomiting earlier this morning, and also with headache.  She says she just cannot get over the nausea.  She has not been able to eat anything for few days.  Denies fevers chills.  Denies shortness of breath or significant cough.   Objective:  Vitals:   03/04/22 2252 03/05/22 0459 03/05/22 0500 03/05/22 1025  BP:  (!) 130/58  134/78  Pulse:  68  82  Resp:  16  18  Temp:  97.7 F (36.5 C)    TempSrc:      SpO2: 100% 99%  98%  Weight:   43 kg    Height:        Intake/Output Summary (Last 24 hours) at 03/05/2022 1126 Last data filed at 03/05/2022 1000 Gross per 24 hour  Intake 2279.72 ml  Output 950 ml  Net 1329.72 ml   Filed Weights   03/03/22 0500 03/04/22 0500 03/05/22 0500  Weight: 62.3 kg 62.2 kg 43 kg    Examination:  General exam: sleeping woke to voice, no acute distress, ill-appearing HEENT: moist mucus membranes, hearing grossly normal  Respiratory system: Lungs clear with diminished bases due to shallow inspirations, no wheezes or rhonchi heard, normal respiratory effort at rest Cardiovascular system: normal S1/S2, RRR, no pedal edema.   Gastrointestinal system: soft, nontender, nondistended Central nervous system: no gross focal neurologic deficits, non-verbal Extremities: no edema, normal tone Skin: dry, intact, normal temperature Psychiatry: normal mood, flat affect, judgment and insight appear normal.    Data Reviewed: personally reviewed   CBC    Component Value Date/Time   WBC 6.1 03/04/2022 0358   RBC 3.79 (L) 03/04/2022 0358   HGB 11.2 (L) 03/04/2022 0358   HGB 13.5 01/28/2020 1532   HCT 35.4 (L) 03/04/2022 0358   HCT 40.0 01/28/2020 1532   PLT 231 03/04/2022 0358   PLT 226 01/28/2020 1532   MCV 93.4 03/04/2022 0358   MCV 88 01/28/2020 1532   MCH 29.6 03/04/2022 0358   MCHC 31.6 03/04/2022 0358   RDW 14.1 03/04/2022 0358   RDW 13.3 01/28/2020 1532   LYMPHSABS 1.6 02/23/2022 1550   MONOABS 0.4 02/23/2022 1550   EOSABS 0.1 02/23/2022 1550   BASOSABS 0.0 02/23/2022 1550      Latest Ref Rng & Units 03/05/2022    3:31 AM 03/04/2022    3:58 AM 03/03/2022    3:42 AM  CMP  Glucose 70 - 99 mg/dL 87  102  145   BUN 8 - 23 mg/dL '8  6  7   '$ Creatinine 0.44 - 1.00 mg/dL 1.09  1.15  1.12   Sodium 135 - 145 mmol/L 139  139  138   Potassium 3.5 - 5.1 mmol/L 4.1  4.6  4.8   Chloride 98 - 111 mmol/L 111  115  115   CO2 22 - 32 mmol/L '23  21  20   '$ Calcium 8.9 - 10.3 mg/dL 8.4  8.3  8.0       Radiology Studies: No results found.   Scheduled Meds:  apixaban  5 mg Oral BID   atorvastatin  40 mg Oral QPC supper   clopidogrel  75 mg Oral Daily   DULoxetine  30 mg Oral Daily   gabapentin  300 mg Oral BID   Gerhardt's butt cream   Topical TID   insulin aspart  0-15 Units Subcutaneous TID WC   isosorbide mononitrate  15 mg Oral Q breakfast   melatonin  5 mg Oral QHS   metFORMIN  500 mg Oral Q breakfast   metoprolol succinate  37.5 mg Oral Daily   nystatin   Topical BID   ondansetron  8 mg Oral Q8H   pantoprazole  40 mg Oral BID AC   silver sulfADIAZINE   Topical Daily  zinc oxide   Topical BID   Continuous Infusions:  ceFEPime (MAXIPIME) IV 2 g (03/05/22 0534)   dextrose 5% lactated ringers 75 mL/hr at 03/05/22 1053   metronidazole 500 mg (03/05/22 0815)   sodium chloride Stopped (02/24/22 1129)     LOS: 10 days   Time spent: [place], DO [facility] To contact the attending provider between 7A-7P or the covering provider during after hours 7P-7A, please log into the web site [email or web address] and access using universal [place] password for that web site. If you do not have the password, please call the hospital operator.  03/05/2022, 11:26 AM

## 2022-03-06 DIAGNOSIS — E860 Dehydration: Secondary | ICD-10-CM | POA: Diagnosis not present

## 2022-03-06 LAB — MAGNESIUM: Magnesium: 1.3 mg/dL — ABNORMAL LOW (ref 1.7–2.4)

## 2022-03-06 LAB — PHOSPHORUS: Phosphorus: 3.1 mg/dL (ref 2.5–4.6)

## 2022-03-06 LAB — BASIC METABOLIC PANEL
Anion gap: 4 — ABNORMAL LOW (ref 5–15)
BUN: 7 mg/dL — ABNORMAL LOW (ref 8–23)
CO2: 26 mmol/L (ref 22–32)
Calcium: 8.4 mg/dL — ABNORMAL LOW (ref 8.9–10.3)
Chloride: 108 mmol/L (ref 98–111)
Creatinine, Ser: 1.05 mg/dL — ABNORMAL HIGH (ref 0.44–1.00)
GFR, Estimated: 54 mL/min — ABNORMAL LOW (ref >60)
Glucose, Bld: 179 mg/dL — ABNORMAL HIGH (ref 70–99)
Potassium: 3.8 mmol/L (ref 3.5–5.1)
Sodium: 138 mmol/L (ref 135–145)

## 2022-03-06 LAB — GLUCOSE, CAPILLARY
Glucose-Capillary: 154 mg/dL — ABNORMAL HIGH (ref 70–99)
Glucose-Capillary: 149 mg/dL — ABNORMAL HIGH (ref 70–99)
Glucose-Capillary: 166 mg/dL — ABNORMAL HIGH (ref 70–99)
Glucose-Capillary: 156 mg/dL — ABNORMAL HIGH (ref 70–99)

## 2022-03-06 MED ORDER — SODIUM CHLORIDE 0.9 % IV SOLN: INTRAVENOUS | Status: DC | PRN

## 2022-03-06 MED ORDER — ONDANSETRON HCL 4 MG/2ML IJ SOLN
4.0000 mg | Freq: Three times a day (TID) | INTRAMUSCULAR | Status: DC
  Administered 2022-03-06 – 2022-03-12 (×19): 4 mg via INTRAVENOUS
  Filled 2022-03-06 (×18): qty 2

## 2022-03-06 MED ORDER — MAGNESIUM SULFATE 2 GM/50ML IV SOLN
2.0000 g | Freq: Once | INTRAVENOUS | Status: AC
  Administered 2022-03-06: 2 g via INTRAVENOUS
  Filled 2022-03-06: qty 50

## 2022-03-06 MED ORDER — IBUPROFEN 400 MG PO TABS
600.0000 mg | ORAL_TABLET | Freq: Once | ORAL | Status: AC
  Administered 2022-03-06: 600 mg via ORAL
  Filled 2022-03-06: qty 1

## 2022-03-06 NOTE — Plan of Care (Signed)
  Problem: Nutrition: Goal: Adequate nutrition will be maintained Outcome: Progressing   Problem: Pain Managment: Goal: General experience of comfort will improve Outcome: Progressing   Problem: Safety: Goal: Ability to remain free from injury will improve Outcome: Progressing   

## 2022-03-06 NOTE — Progress Notes (Signed)
OT Cancellation Note  Patient Details Name: EVERLY RUBALCAVA MRN: [id number] DOB: [birth year]   Cancelled Treatment:    Reason Eval/Treat Not Completed: Patient declined, no reason specified Patient reported she was not doing therapy today, patient was educated on importance of movement even when not feeling well. Patient reported she was not participating in any movement today. OT to continue to follow. D/c plans will need to be updated with increased time in bed and decreased participation in ADLs.  Rennie Plowman, MS Acute Rehabilitation Department Office# [phone]  03/06/2022, 11:42 AM

## 2022-03-06 NOTE — Progress Notes (Addendum)
PROGRESS NOTE    Maria Fox  WCB:[id number] DOB: 1945/10/23 DOA: 02/23/2022 PCP: [name], MD  76/F w HTN HLD DM TY 2 prior CVA-multi infarcts and corona radiata during CABG-left PCA stroke 2017 Severe three-vessel CAD status post CABG 05/2014, subsequent PCI 11/13/2017 supposed to be 6 months dual antiplatelet, stage ii CKD,  chronic NSVT since 2016 EF 45% echo 03/27/2014, a flutter , DVT, Peripheral neuropathy, Prior buttock abscess status post incision and drainage 08/05/2020   Presented to ED 12/16 with a week long illness nausea vomiting diarrhea and weakness to the point of being unable to ambulate-found to have influenza A as well as dehydration Contact dermatitis on buttocks and perineum   Hospital course complicated by development of superimposed bacterial pneumonia, treated with IV antibiotics.  Also with ongoing nausea vomiting and very poor p.o. intake, on IV fluids.   Subjective: c/o nausea, headaches  Assessment and Plan:  Influenza A Superimposed bacterial pneumonia- based on CXR 12/22 Lactic acidosis -resolved - Tamiflu completed 12/21 -- Started on Cefepime/Flagyl 12/22 -DC flagyl, continue nebs, pulm toilet   Hyperkalemia -resolved.   --Treated with Lokelma   AKI superimposed on CKD 3 on admission - Continue maintenance IV fluids, remains with poor PO intake - Hold Lasix 20 from PTA   Nausea abdominal discomfort -likely related to flagyl, will Dc this, schedule zofran TID AC --IV Compazine PRN N/V -continue IVF today   Headache -Fioricet as needed if headache not responsive to Tylenol   Klebsiella/E. coli found in urine - Received 3 doses ceftriaxone now on cefepime for above   SVT superimposed on chronic A-fib 2-1 Underlying CAD - Increased metoprolol to 37.5 XL  -Continue apixaban 5 twice daily -Monitor and replace K, Mg   Contact dermatitis -Barrier cream ordered as well as nystatin and monitor   Type 2 diabetes - last A1c 9.5, associated  with diabetic neuropathy - Continue insulin sliding scale coverage, sugars reasonable below 180 and drop metformin dose to 500 daily with breakfast - Continue Neurontin 300 twice daily   HFpEF -Lasix held, 2D echo EF 60 to 65%   Prior strokes -Continue Plavix 75 daily   DVT prophylaxis: apixaban Code Status: DNR, d/w pt this morning Family Communication: none present Disposition Plan: SNF in 1-2days   Consultants:    Procedures:   Antimicrobials:    Objective: Vitals:   03/05/22 1025 03/05/22 2155 03/06/22 0500 03/06/22 0522  BP: 134/78 (!) 158/71  130/68  Pulse: 82 63  64  Resp: '18 20  19  '$ Temp: 97.8 F (36.6 C) 97.6 F (36.4 C)  (!) 97.2 F (36.2 C)  TempSrc:    Oral  SpO2: 98% 99%  97%  Weight:   47 kg   Height:        Intake/Output Summary (Last 24 hours) at 03/06/2022 0603 Last data filed at 03/06/2022 0537 Gross per 24 hour  Intake 2031.47 ml  Output 1200 ml  Net 831.47 ml   Filed Weights   03/04/22 0500 03/05/22 0500 03/06/22 0500  Weight: 62.2 kg 43 kg 47 kg    Examination:  General exam: Appears calm but un comfortable, AAOx3 Respiratory system: Clear to auscultation Cardiovascular system: S1 & S2 heard, RRR.  Abd: nondistended, soft and nontender.Normal bowel sounds heard. Central nervous system: Alert and oriented. No focal neurological deficits. Extremities: no edema Skin: No rashes Psychiatry:  Mood & affect appropriate.     Data Reviewed:   CBC: Recent Labs  Lab 02/28/22 [phone]  03/01/22 0323 03/02/22 0405 03/03/22 0342 03/04/22 0358  WBC 5.3 5.6 6.1 6.4 6.1  HGB 11.7* 11.6* 10.7* 9.8* 11.2*  HCT 35.6* 35.5* 33.5* 31.3* 35.4*  MCV 90.8 90.3 93.8 94.3 93.4  PLT 190 206 197 191 092   Basic Metabolic Panel: Recent Labs  Lab 02/28/22 0313 03/01/22 0323 03/02/22 0405 03/03/22 0342 03/04/22 0358 03/05/22 0331 03/06/22 0412  NA 138 141 142 138 139 139 138  K 4.0 3.9 5.4* 4.8 4.6 4.1 3.8  CL 113* 114* 117* 115* 115* 111  108  CO2 19* 20* 19* 20* 21* 23 26  GLUCOSE 104* 130* 145* 145* 102* 87 179*  BUN <5* 5* 5* 7* 6* 8 7*  CREATININE 0.89 0.94 1.41* 1.12* 1.15* 1.09* 1.05*  CALCIUM 7.9* 8.1* 8.2* 8.0* 8.3* 8.4* 8.4*  MG 1.6* 1.6* 2.0  --  1.9  --  1.3*  PHOS  --   --   --   --   --   --  3.1   GFR: Estimated Creatinine Clearance: 32.8 mL/min (A) (by C-G formula based on SCr of 1.05 mg/dL (H)). Liver Function Tests: Recent Labs  Lab 02/28/22 0313 03/01/22 0323 03/02/22 0405  AST 14* 14* 12*  ALT '12 11 11  '$ ALKPHOS 51 55 61  BILITOT 0.5 0.8 0.4  PROT 5.6* 5.6* 4.9*  ALBUMIN 3.0* 3.1* 2.5*   No results for input(s): "LIPASE", "AMYLASE" in the last 168 hours. No results for input(s): "AMMONIA" in the last 168 hours. Coagulation Profile: No results for input(s): "INR", "PROTIME" in the last 168 hours. Cardiac Enzymes: No results for input(s): "CKTOTAL", "CKMB", "CKMBINDEX", "TROPONINI" in the last 168 hours. BNP (last 3 results) No results for input(s): "PROBNP" in the last 8760 hours. HbA1C: No results for input(s): "HGBA1C" in the last 72 hours. CBG: Recent Labs  Lab 03/04/22 2231 03/05/22 0715 03/05/22 1131 03/05/22 1544 03/05/22 2200  GLUCAP 93 88 112* 154* 155*   Lipid Profile: No results for input(s): "CHOL", "HDL", "LDLCALC", "TRIG", "CHOLHDL", "LDLDIRECT" in the last 72 hours. Thyroid Function Tests: No results for input(s): "TSH", "T4TOTAL", "FREET4", "T3FREE", "THYROIDAB" in the last 72 hours. Anemia Panel: No results for input(s): "VITAMINB12", "FOLATE", "FERRITIN", "TIBC", "IRON", "RETICCTPCT" in the last 72 hours. Urine analysis:    Component Value Date/Time   COLORURINE AMBER (A) 02/24/2022 1052   APPEARANCEUR CLOUDY (A) 02/24/2022 1052   LABSPEC 1.018 02/24/2022 1052   PHURINE 5.0 02/24/2022 1052   GLUCOSEU 150 (A) 02/24/2022 1052   HGBUR LARGE (A) 02/24/2022 1052   BILIRUBINUR NEGATIVE 02/24/2022 1052   BILIRUBINUR negative 06/13/2020 1552   KETONESUR 20 (A)  02/24/2022 1052   PROTEINUR 30 (A) 02/24/2022 1052   UROBILINOGEN 0.2 06/13/2020 1552   UROBILINOGEN 0.2 09/16/2014 0009   NITRITE POSITIVE (A) 02/24/2022 1052   LEUKOCYTESUR LARGE (A) 02/24/2022 1052   Sepsis Labs: '@LABRCNTIP'$ (procalcitonin:4,lacticidven:4)  ) Recent Results (from the past 240 hour(s))  C Difficile Quick Screen w PCR reflex     Status: None   Collection Time: 02/24/22 10:52 AM   Specimen: Stool  Result Value Ref Range Status   C Diff antigen NEGATIVE NEGATIVE Final   C Diff toxin NEGATIVE NEGATIVE Final   C Diff interpretation No C. difficile detected.  Final    Comment: Performed at [facility], [place] [address], [place], [place] [address]  Gastrointestinal Panel by PCR , Stool     Status: None   Collection Time: 02/25/22 10:45 AM   Specimen: Stool  Result Value Ref Range Status   Campylobacter species NOT DETECTED NOT DETECTED Final   Plesimonas shigelloides NOT DETECTED NOT DETECTED Final   Salmonella species NOT DETECTED NOT DETECTED Final   Yersinia enterocolitica NOT DETECTED NOT DETECTED Final   Vibrio species NOT DETECTED NOT DETECTED Final   Vibrio cholerae NOT DETECTED NOT DETECTED Final   Enteroaggregative E coli (EAEC) NOT DETECTED NOT DETECTED Final   Enteropathogenic E coli (EPEC) NOT DETECTED NOT DETECTED Final   Enterotoxigenic E coli (ETEC) NOT DETECTED NOT DETECTED Final   Shiga like toxin producing E coli (STEC) NOT DETECTED NOT DETECTED Final   Shigella/Enteroinvasive E coli (EIEC) NOT DETECTED NOT DETECTED Final   Cryptosporidium NOT DETECTED NOT DETECTED Final   Cyclospora cayetanensis NOT DETECTED NOT DETECTED Final   Entamoeba histolytica NOT DETECTED NOT DETECTED Final   Giardia lamblia NOT DETECTED NOT DETECTED Final   Adenovirus F40/41 NOT DETECTED NOT DETECTED Final   Astrovirus NOT DETECTED NOT DETECTED Final   Norovirus GI/GII NOT DETECTED NOT DETECTED Final   Rotavirus A NOT DETECTED NOT DETECTED Final    Sapovirus (I, II, IV, and V) NOT DETECTED NOT DETECTED Final    Comment: Performed at [facility], [address], [place], [place] [address]  Urine Culture     Status: Abnormal   Collection Time: 02/26/22 12:07 PM   Specimen: Urine, Clean Catch  Result Value Ref Range Status   Specimen Description   Final    URINE, CLEAN CATCH Performed at [facility], [place] [address], [place], [place] [address]    Special Requests   Final    NONE Performed at [facility], [place] [address] [place],  [address]    Culture (A)  Final    >=100,000 COLONIES/mL KLEBSIELLA PNEUMONIAE 60,000 COLONIES/mL ESCHERICHIA COLI    Report Status 02/28/2022 FINAL  Final   Organism ID, Bacteria KLEBSIELLA PNEUMONIAE (A)  Final   Organism ID, Bacteria ESCHERICHIA COLI (A)  Final      Susceptibility   Escherichia coli - MIC*    AMPICILLIN 16 INTERMEDIATE Intermediate     CEFAZOLIN <=4 SENSITIVE Sensitive     CEFEPIME <=0.12 SENSITIVE Sensitive     CEFTRIAXONE <=0.25 SENSITIVE Sensitive     CIPROFLOXACIN <=0.25 SENSITIVE Sensitive     GENTAMICIN <=1 SENSITIVE Sensitive     IMIPENEM <=0.25 SENSITIVE Sensitive     NITROFURANTOIN <=16 SENSITIVE Sensitive     TRIMETH/SULFA <=20 SENSITIVE Sensitive     AMPICILLIN/SULBACTAM 4 SENSITIVE Sensitive     PIP/TAZO <=4 SENSITIVE Sensitive     * 60,000 COLONIES/mL ESCHERICHIA COLI   Klebsiella pneumoniae - MIC*    AMPICILLIN >=32 RESISTANT Resistant     CEFAZOLIN <=4 SENSITIVE Sensitive     CEFEPIME <=0.12 SENSITIVE Sensitive     CEFTRIAXONE <=0.25 SENSITIVE Sensitive     CIPROFLOXACIN <=0.25 SENSITIVE Sensitive     GENTAMICIN <=1 SENSITIVE Sensitive     IMIPENEM 0.5 SENSITIVE Sensitive     NITROFURANTOIN 64 INTERMEDIATE Intermediate     TRIMETH/SULFA <=20 SENSITIVE Sensitive     AMPICILLIN/SULBACTAM 4 SENSITIVE Sensitive     PIP/TAZO <=4 SENSITIVE Sensitive     * >=100,000 COLONIES/mL KLEBSIELLA PNEUMONIAE      Radiology Studies: No results found.   Scheduled Meds:  apixaban  5 mg Oral BID   atorvastatin  40 mg Oral QPC supper   clopidogrel  75 mg Oral Daily   DULoxetine  30 mg Oral Daily  gabapentin  300 mg Oral BID   Gerhardt's butt cream   Topical TID   insulin aspart  0-15 Units Subcutaneous TID WC   isosorbide mononitrate  15 mg Oral Q breakfast   melatonin  5 mg Oral QHS   metFORMIN  500 mg Oral Q breakfast   metoprolol succinate  37.5 mg Oral Daily   nystatin   Topical BID   ondansetron  8 mg Oral Q8H   pantoprazole  40 mg Oral BID AC   silver sulfADIAZINE   Topical Daily   zinc oxide   Topical BID   Continuous Infusions:  sodium chloride 10 mL/hr at 03/06/22 0507   ceFEPime (MAXIPIME) IV 2 g (03/06/22 0509)   dextrose 5% lactated ringers 75 mL/hr at 03/05/22 1855   metronidazole 500 mg (03/05/22 2200)   sodium chloride Stopped (02/24/22 1129)     LOS: 11 days    Time spent: 59mn    PDomenic Polite MD [facility]   03/06/2022, 6:03 AM

## 2022-03-06 NOTE — Progress Notes (Signed)
PT Cancellation Note  Patient Details Name: ANAMARIA DUSENBURY MRN: [id number] DOB: [birth year]   Cancelled Treatment:     Pt declined any and all attempts stating, "I have a headache and nausea.  I'm not going to do anything today".   Pt also not getting OOB for nursing staff.  Staff is having to change her bed with her in it and using periwick Pt stated she can walk "I like to go into my yard" and that her Spouse "helps some" .  Per chart review, pt declines HH services and plans to D/C back home when medically stable. Will continue to attempt to see.  Pt is pretty adamant and non compliant.   Rica Koyanagi  PTA Acute  Rehabilitation Services Office M-F          [phone] Weekend pager [phone]

## 2022-03-07 DIAGNOSIS — E860 Dehydration: Secondary | ICD-10-CM | POA: Diagnosis not present

## 2022-03-07 LAB — COMPREHENSIVE METABOLIC PANEL
ALT: 12 U/L (ref 0–44)
AST: 24 U/L (ref 15–41)
Albumin: 2.2 g/dL — ABNORMAL LOW (ref 3.5–5.0)
Alkaline Phosphatase: 57 U/L (ref 38–126)
Anion gap: 4 — ABNORMAL LOW (ref 5–15)
BUN: 8 mg/dL (ref 8–23)
CO2: 27 mmol/L (ref 22–32)
Calcium: 8.2 mg/dL — ABNORMAL LOW (ref 8.9–10.3)
Chloride: 109 mmol/L (ref 98–111)
Creatinine, Ser: 1.07 mg/dL — ABNORMAL HIGH (ref 0.44–1.00)
GFR, Estimated: 53 mL/min — ABNORMAL LOW (ref >60)
Glucose, Bld: 170 mg/dL — ABNORMAL HIGH (ref 70–99)
Potassium: 3.7 mmol/L (ref 3.5–5.1)
Sodium: 140 mmol/L (ref 135–145)
Total Bilirubin: 0.5 mg/dL (ref 0.3–1.2)
Total Protein: 4.7 g/dL — ABNORMAL LOW (ref 6.5–8.1)

## 2022-03-07 LAB — CBC
HCT: 31.7 % — ABNORMAL LOW (ref 36.0–46.0)
Hemoglobin: 10.2 g/dL — ABNORMAL LOW (ref 12.0–15.0)
MCH: 29.7 pg (ref 26.0–34.0)
MCHC: 32.2 g/dL (ref 30.0–36.0)
MCV: 92.2 fL (ref 80.0–100.0)
Platelets: 213 10*3/uL (ref 150–400)
RBC: 3.44 MIL/uL — ABNORMAL LOW (ref 3.87–5.11)
RDW: 14.3 % (ref 11.5–15.5)
WBC: 5.1 10*3/uL (ref 4.0–10.5)
nRBC: 0 % (ref 0.0–0.2)

## 2022-03-07 LAB — GLUCOSE, CAPILLARY
Glucose-Capillary: 180 mg/dL — ABNORMAL HIGH (ref 70–99)
Glucose-Capillary: 262 mg/dL — ABNORMAL HIGH (ref 70–99)
Glucose-Capillary: 130 mg/dL — ABNORMAL HIGH (ref 70–99)
Glucose-Capillary: 122 mg/dL — ABNORMAL HIGH (ref 70–99)

## 2022-03-07 MED ORDER — ENSURE ENLIVE PO LIQD
237.0000 mL | Freq: Three times a day (TID) | ORAL | Status: DC
  Administered 2022-03-07 – 2022-03-10 (×8): 237 mL via ORAL

## 2022-03-07 MED ORDER — IBUPROFEN 400 MG PO TABS
600.0000 mg | ORAL_TABLET | Freq: Once | ORAL | Status: AC
  Administered 2022-03-07: 600 mg via ORAL
  Filled 2022-03-07: qty 1

## 2022-03-07 NOTE — Progress Notes (Signed)
PROGRESS NOTE    Maria Fox  GEX:[id number] DOB: Apr 09, 1945 DOA: 02/23/2022 PCP: [name], MD  76/F w HTN HLD DM TY 2 prior CVA-multi infarcts and corona radiata during CABG-left PCA stroke 2017 Severe three-vessel CAD status post CABG 05/2014, subsequent PCI 11/13/2017 supposed to be 6 months dual antiplatelet, stage ii CKD,  chronic NSVT since 2016 EF 45% echo 03/27/2014, a flutter , DVT, Peripheral neuropathy, Prior buttock abscess status post incision and drainage 08/05/2020   Presented to ED 12/16 with a week long illness nausea vomiting diarrhea and weakness to the point of being unable to ambulate-found to have influenza A as well as dehydration Contact dermatitis on buttocks and perineum   Hospital course complicated by development of superimposed bacterial pneumonia, treated with IV antibiotics.  Also with ongoing nausea vomiting and very poor p.o. intake, on IV fluids.   Subjective: Continues to complain of nausea, denies any vomiting  Assessment and Plan:  Influenza A Superimposed bacterial pneumonia- based on CXR 12/22 Lactic acidosis -resolved -Tamiflu completed 12/21 -Started on Cefepime/Flagyl 12/22 -Stopped Flagyl yesterday, will discontinue cefepime today -continue nebs, pulm toilet  Persistent nausea -No further vomiting, continues to be debilitated by severe nausea -I suspected her symptoms were initially secondary to fluid, respiratory illness followed by Flagyl -Discontinued Flagyl yesterday, will stop cefepime as well, no exam is benign -Continue PPI, add Ensure 3 times daily between meals -Supportive care, increase activity, will consider CT head if severe nausea persists -IV Fluids, Continue Scheduled Zofran and PRN Compazine   Hyperkalemia -resolved.   --Treated with Lokelma   AKI superimposed on CKD 3 on admission -Resolved, IV fluids discontinued - Hold Lasix 20 from PTA   Headache -Fioricet as needed if headache not responsive to Tylenol    Klebsiella/E. coli UTI - Received 3 doses ceftriaxone followed by 5 days of cefepime   SVT superimposed on chronic A-fib 2-1 Underlying CAD - Increased metoprolol to 37.5 XL  -Continue apixaban 5 twice daily   Contact dermatitis -Barrier cream ordered as well as nystatin and monitor   Type 2 diabetes - last A1c 9.5, associated with diabetic neuropathy - Continue insulin sliding scale coverage, sugars reasonable below 180 and drop metformin dose to 500 daily with breakfast - Continue Neurontin 300 twice daily   Chronic diastolic CHF -Lasix held, 2D echo EF 60 to 65%   H/o multiple strokes -Continue Plavix 75 daily   DVT prophylaxis: apixaban Code Status: DNR Family Communication: none present Disposition Plan: SNF in 1-2days   Consultants:    Procedures:   Antimicrobials:    Objective: Vitals:   03/06/22 0522 03/06/22 1404 03/06/22 2132 03/07/22 0602  BP: 130/68 (!) 103/48 (!) 148/76 135/66  Pulse: 64 (!) 59 68 70  Resp: '19 18 17 16  '$ Temp: (!) 97.2 F (36.2 C) 97.8 F (36.6 C) 97.8 F (36.6 C) (!) 97.5 F (36.4 C)  TempSrc: Oral  Oral Oral  SpO2: 97% 98% 99% 94%  Weight:      Height:        Intake/Output Summary (Last 24 hours) at 03/07/2022 1220 Last data filed at 03/07/2022 0602 Gross per 24 hour  Intake 2247.73 ml  Output 800 ml  Net 1447.73 ml   Filed Weights   03/04/22 0500 03/05/22 0500 03/06/22 0500  Weight: 62.2 kg 43 kg 47 kg    Examination:  General exam: Ill elderly female, laying in bed, eyes closed, hand towel on forehead, ill-appearing, oriented x 3 CVS: S1-S2, regular  rhythm Lungs: Few scattered rhonchi otherwise clear Abdomen: Soft, nontender, bowel sounds present Extremities: No edema Skin: No rashes Psychiatry:  Mood & affect appropriate.     Data Reviewed:   CBC: Recent Labs  Lab 03/01/22 0323 03/02/22 0405 03/03/22 0342 03/04/22 0358 03/07/22 0423  WBC 5.6 6.1 6.4 6.1 5.1  HGB 11.6* 10.7* 9.8* 11.2* 10.2*   HCT 35.5* 33.5* 31.3* 35.4* 31.7*  MCV 90.3 93.8 94.3 93.4 92.2  PLT 206 197 191 231 270   Basic Metabolic Panel: Recent Labs  Lab 03/01/22 0323 03/02/22 0405 03/03/22 0342 03/04/22 0358 03/05/22 0331 03/06/22 0412 03/07/22 0423  NA 141 142 138 139 139 138 140  K 3.9 5.4* 4.8 4.6 4.1 3.8 3.7  CL 114* 117* 115* 115* 111 108 109  CO2 20* 19* 20* 21* '23 26 27  '$ GLUCOSE 130* 145* 145* 102* 87 179* 170*  BUN 5* 5* 7* 6* 8 7* 8  CREATININE 0.94 1.41* 1.12* 1.15* 1.09* 1.05* 1.07*  CALCIUM 8.1* 8.2* 8.0* 8.3* 8.4* 8.4* 8.2*  MG 1.6* 2.0  --  1.9  --  1.3*  --   PHOS  --   --   --   --   --  3.1  --    GFR: Estimated Creatinine Clearance: 32.2 mL/min (A) (by C-G formula based on SCr of 1.07 mg/dL (H)). Liver Function Tests: Recent Labs  Lab 03/01/22 0323 03/02/22 0405 03/07/22 0423  AST 14* 12* 24  ALT '11 11 12  '$ ALKPHOS 55 61 57  BILITOT 0.8 0.4 0.5  PROT 5.6* 4.9* 4.7*  ALBUMIN 3.1* 2.5* 2.2*   No results for input(s): "LIPASE", "AMYLASE" in the last 168 hours. No results for input(s): "AMMONIA" in the last 168 hours. Coagulation Profile: No results for input(s): "INR", "PROTIME" in the last 168 hours. Cardiac Enzymes: No results for input(s): "CKTOTAL", "CKMB", "CKMBINDEX", "TROPONINI" in the last 168 hours. BNP (last 3 results) No results for input(s): "PROBNP" in the last 8760 hours. HbA1C: No results for input(s): "HGBA1C" in the last 72 hours. CBG: Recent Labs  Lab 03/06/22 1143 03/06/22 1629 03/06/22 2136 03/07/22 0749 03/07/22 1142  GLUCAP 149* 166* 156* 180* 262*   Lipid Profile: No results for input(s): "CHOL", "HDL", "LDLCALC", "TRIG", "CHOLHDL", "LDLDIRECT" in the last 72 hours. Thyroid Function Tests: No results for input(s): "TSH", "T4TOTAL", "FREET4", "T3FREE", "THYROIDAB" in the last 72 hours. Anemia Panel: No results for input(s): "VITAMINB12", "FOLATE", "FERRITIN", "TIBC", "IRON", "RETICCTPCT" in the last 72 hours. Urine analysis:     Component Value Date/Time   COLORURINE AMBER (A) 02/24/2022 1052   APPEARANCEUR CLOUDY (A) 02/24/2022 1052   LABSPEC 1.018 02/24/2022 1052   PHURINE 5.0 02/24/2022 1052   GLUCOSEU 150 (A) 02/24/2022 1052   HGBUR LARGE (A) 02/24/2022 1052   BILIRUBINUR NEGATIVE 02/24/2022 1052   BILIRUBINUR negative 06/13/2020 1552   KETONESUR 20 (A) 02/24/2022 1052   PROTEINUR 30 (A) 02/24/2022 1052   UROBILINOGEN 0.2 06/13/2020 1552   UROBILINOGEN 0.2 09/16/2014 0009   NITRITE POSITIVE (A) 02/24/2022 1052   LEUKOCYTESUR LARGE (A) 02/24/2022 1052   Sepsis Labs: '@LABRCNTIP'$ (procalcitonin:4,lacticidven:4)  ) Recent Results (from the past 240 hour(s))  Urine Culture     Status: Abnormal   Collection Time: 02/26/22 12:07 PM   Specimen: Urine, Clean Catch  Result Value Ref Range Status   Specimen Description   Final    URINE, CLEAN CATCH Performed at [facility], [place] [address], [place], [place] [address]  Special Requests   Final    NONE Performed at [facility], [place] [address], [place], [place] [address]    Culture (A)  Final    >=100,000 COLONIES/mL KLEBSIELLA PNEUMONIAE 60,000 COLONIES/mL ESCHERICHIA COLI    Report Status 02/28/2022 FINAL  Final   Organism ID, Bacteria KLEBSIELLA PNEUMONIAE (A)  Final   Organism ID, Bacteria ESCHERICHIA COLI (A)  Final      Susceptibility   Escherichia coli - MIC*    AMPICILLIN 16 INTERMEDIATE Intermediate     CEFAZOLIN <=4 SENSITIVE Sensitive     CEFEPIME <=0.12 SENSITIVE Sensitive     CEFTRIAXONE <=0.25 SENSITIVE Sensitive     CIPROFLOXACIN <=0.25 SENSITIVE Sensitive     GENTAMICIN <=1 SENSITIVE Sensitive     IMIPENEM <=0.25 SENSITIVE Sensitive     NITROFURANTOIN <=16 SENSITIVE Sensitive     TRIMETH/SULFA <=20 SENSITIVE Sensitive     AMPICILLIN/SULBACTAM 4 SENSITIVE Sensitive     PIP/TAZO <=4 SENSITIVE Sensitive     * 60,000 COLONIES/mL ESCHERICHIA COLI   Klebsiella pneumoniae - MIC*    AMPICILLIN >=32  RESISTANT Resistant     CEFAZOLIN <=4 SENSITIVE Sensitive     CEFEPIME <=0.12 SENSITIVE Sensitive     CEFTRIAXONE <=0.25 SENSITIVE Sensitive     CIPROFLOXACIN <=0.25 SENSITIVE Sensitive     GENTAMICIN <=1 SENSITIVE Sensitive     IMIPENEM 0.5 SENSITIVE Sensitive     NITROFURANTOIN 64 INTERMEDIATE Intermediate     TRIMETH/SULFA <=20 SENSITIVE Sensitive     AMPICILLIN/SULBACTAM 4 SENSITIVE Sensitive     PIP/TAZO <=4 SENSITIVE Sensitive     * >=100,000 COLONIES/mL KLEBSIELLA PNEUMONIAE     Radiology Studies: No results found.   Scheduled Meds:  apixaban  5 mg Oral BID   atorvastatin  40 mg Oral QPC supper   clopidogrel  75 mg Oral Daily   DULoxetine  30 mg Oral Daily   feeding supplement  237 mL Oral TID BM   gabapentin  300 mg Oral BID   Gerhardt's butt cream   Topical TID   insulin aspart  0-15 Units Subcutaneous TID WC   isosorbide mononitrate  15 mg Oral Q breakfast   melatonin  5 mg Oral QHS   metoprolol succinate  37.5 mg Oral Daily   nystatin   Topical BID   ondansetron (ZOFRAN) IV  4 mg Intravenous TID AC   pantoprazole  40 mg Oral BID AC   silver sulfADIAZINE   Topical Daily   zinc oxide   Topical BID   Continuous Infusions:  sodium chloride 10 mL/hr at 03/06/22 0507   sodium chloride Stopped (02/24/22 1129)     LOS: 12 days    Time spent: 63mn    PDomenic Polite MD [facility]   03/07/2022, 12:20 PM

## 2022-03-08 ENCOUNTER — Inpatient Hospital Stay (HOSPITAL_COMMUNITY): Payer: Medicare Other

## 2022-03-08 ENCOUNTER — Ambulatory Visit: Payer: Medicare Other | Admitting: Family Medicine

## 2022-03-08 DIAGNOSIS — E860 Dehydration: Secondary | ICD-10-CM | POA: Diagnosis not present

## 2022-03-08 LAB — CBC
HCT: 31.6 % — ABNORMAL LOW (ref 36.0–46.0)
Hemoglobin: 10.2 g/dL — ABNORMAL LOW (ref 12.0–15.0)
MCH: 30.2 pg (ref 26.0–34.0)
MCHC: 32.3 g/dL (ref 30.0–36.0)
MCV: 93.5 fL (ref 80.0–100.0)
Platelets: 192 10*3/uL (ref 150–400)
RBC: 3.38 MIL/uL — ABNORMAL LOW (ref 3.87–5.11)
RDW: 14.6 % (ref 11.5–15.5)
WBC: 5.8 10*3/uL (ref 4.0–10.5)
nRBC: 0 % (ref 0.0–0.2)

## 2022-03-08 LAB — COMPREHENSIVE METABOLIC PANEL
ALT: 15 U/L (ref 0–44)
AST: 30 U/L (ref 15–41)
Albumin: 2.1 g/dL — ABNORMAL LOW (ref 3.5–5.0)
Alkaline Phosphatase: 65 U/L (ref 38–126)
Anion gap: 3 — ABNORMAL LOW (ref 5–15)
BUN: 13 mg/dL (ref 8–23)
CO2: 25 mmol/L (ref 22–32)
Calcium: 8.1 mg/dL — ABNORMAL LOW (ref 8.9–10.3)
Chloride: 108 mmol/L (ref 98–111)
Creatinine, Ser: 1.29 mg/dL — ABNORMAL HIGH (ref 0.44–1.00)
GFR, Estimated: 42 mL/min — ABNORMAL LOW (ref >60)
Glucose, Bld: 196 mg/dL — ABNORMAL HIGH (ref 70–99)
Potassium: 3.9 mmol/L (ref 3.5–5.1)
Sodium: 136 mmol/L (ref 135–145)
Total Bilirubin: 0.5 mg/dL (ref 0.3–1.2)
Total Protein: 4.8 g/dL — ABNORMAL LOW (ref 6.5–8.1)

## 2022-03-08 LAB — GLUCOSE, CAPILLARY
Glucose-Capillary: 155 mg/dL — ABNORMAL HIGH (ref 70–99)
Glucose-Capillary: 225 mg/dL — ABNORMAL HIGH (ref 70–99)
Glucose-Capillary: 132 mg/dL — ABNORMAL HIGH (ref 70–99)

## 2022-03-08 NOTE — Progress Notes (Addendum)
Physical Therapy Treatment Patient Details Name: Maria Fox MRN: [id number] DOB: 01-30-1946 Today's Date: 03/08/2022   History of Present Illness Patient is a 76 year old female who presented with a week history of nausea and vomiting with diarrhea. Patient was admitted with influenza A, dehydration, contact dermatitis. PMH: CKD II, DM, CVA 2016, CABG, L PCA stroke 2017, COVID 19, RBBB, autoimmune deficiency syndrome.    PT Comments    Caught pt on a "good day".  Been trying to walk her in the hallway all week.  She kept refusing. AxO x3 following all directions.  Stated she lives with a female "friend" (15 years now).  Stated she feels "better" but still has headaches "off and on".  Assisted OOB to amb to bathroom then amb a great distance in hallway.  Avg HR was 78 and RA was 95%.  NO dyspnea.  NO cough.  Pt talked the whole time.   Pt plans to return to home.  Recommendations for follow up therapy are one component of a multi-disciplinary discharge planning process, led by the attending physician.  Recommendations may be updated based on patient status, additional functional criteria and insurance authorization.  Follow Up Recommendations  Home health PT     Assistance Recommended at Discharge Frequent or constant Supervision/Assistance  Patient can return home with the following A little help with walking and/or transfers;A little help with bathing/dressing/bathroom   Equipment Recommendations  None recommended by PT    Recommendations for Other Services       Precautions / Restrictions Precautions Precautions: Fall Precaution Comments: incontinent (bladder, bowel); dizziness Restrictions Weight Bearing Restrictions: No     Mobility  Bed Mobility Overal bed mobility: Needs Assistance Bed Mobility: Supine to Sit, Sit to Supine     Supine to sit: Supervision Sit to supine: Supervision   General bed mobility comments: pt self able to get to EOB and back into bed.  Pt  likes to lay sidelying.    Transfers Overall transfer level: Needs assistance Equipment used: 1 person hand held assist Transfers: Sit to/from Stand, Bed to chair/wheelchair/BSC Sit to Stand: Min guard Stand pivot transfers: Min guard, Min assist         General transfer comment: mild unsteady with use of hands and increased time.  Also asissted with a toilet transfer.  Self able to perfrom peri care seated.  Self able to don/doff underpants with slight hold to grab bar for balance.    Ambulation/Gait Ambulation/Gait assistance: Min assist Gait Distance (Feet): 225 Feet Assistive device: 1 person hand held assist Gait Pattern/deviations: Step-through pattern, Decreased stride length Gait velocity: decreased     General Gait Details: assisted with amb to bathroom hand held asisst then in hallway (1/2 unit) hand held assist.  "I don't need a walker" although she was reaching for walls/rails to steady self.  Mild instability.  Most likely a "furniture walker" prior.   Stairs             Wheelchair Mobility    Modified Rankin (Stroke Patients Only)       Balance                                            Cognition Arousal/Alertness: Awake/alert Behavior During Therapy: WFL for tasks assessed/performed Overall Cognitive Status: Within Functional Limits for tasks assessed  General Comments: AxO x 3 pleasant this session.  [place] participated.        Exercises      General Comments        Pertinent Vitals/Pain Pain Assessment Pain Assessment: No/denies pain    Home Living                          Prior Function            PT Goals (current goals can now be found in the care plan section) Progress towards PT goals: Progressing toward goals    Frequency    Min 3X/week      PT Plan Current plan remains appropriate    Co-evaluation              AM-PAC PT "6  Clicks" Mobility   Outcome Measure  Help needed turning from your back to your side while in a flat bed without using bedrails?: A Little Help needed moving from lying on your back to sitting on the side of a flat bed without using bedrails?: A Little Help needed moving to and from a bed to a chair (including a wheelchair)?: A Little Help needed standing up from a chair using your arms (e.g., wheelchair or bedside chair)?: A Little Help needed to walk in hospital room?: A Little Help needed climbing 3-5 steps with a railing? : A Lot 6 Click Score: 17    End of Session Equipment Utilized During Treatment: Gait belt Activity Tolerance: Patient limited by fatigue Patient left: in bed;with call bell/phone within reach;with bed alarm set Nurse Communication: Mobility status PT Visit Diagnosis: Muscle weakness (generalized) (M62.81);Difficulty in walking, not elsewhere classified (R26.2)     Time: 2595-6387 PT Time Calculation (min) (ACUTE ONLY): 25 min  Charges:  $Gait Training: 8-22 mins $Therapeutic Activity: 8-22 mins                     {Parveen Freehling  PTA Acute  Rehabilitation Services Office M-F          [phone] Weekend pager [phone]

## 2022-03-08 NOTE — Progress Notes (Addendum)
PROGRESS NOTE    Maria Fox  MEQ:[id number] DOB: Feb 19, 1946 DOA: 02/23/2022 PCP: [name], MD  76/F w HTN HLD DM TY 2 prior CVA-multi infarcts and corona radiata during CABG-left PCA stroke 2017 Severe three-vessel CAD status post CABG 05/2014, subsequent PCI 11/13/2017 supposed to be 6 months dual antiplatelet, stage ii CKD,  chronic NSVT since 2016 EF 45% echo 03/27/2014, a flutter , DVT, Peripheral neuropathy, Prior buttock abscess status post incision and drainage 08/05/2020   Presented to ED 12/16 with a week long illness nausea vomiting diarrhea and weakness to the point of being unable to ambulate-found to have influenza A as well as dehydration Contact dermatitis on buttocks and perineum   Hospital course complicated by development of superimposed bacterial pneumonia, treated with IV antibiotics.  Also with ongoing nausea vomiting and very poor p.o. intake, on IV fluids. -12/27, Flagyl discontinued for nausea -12/28-cefepime discontinued, scheduled Zofran -12/29: Getting CT head and cortisol   Subjective: Ate some lunch and breakfast yesterday -This morning complains of severe nausea and headache -Refuses to work with physical therapy, lays in bed all day with eyes closed  Assessment and Plan:  Influenza A Superimposed bacterial pneumonia- based on CXR 12/22 Lactic acidosis -resolved -Tamiflu completed 12/21 -Started on Cefepime/Flagyl 12/22 -Discontinued cefepime and Flagyl, completed>7 days of antibiotics -continue nebs, pulm toilet  Persistent nausea -No further vomiting, continues to be debilitated by severe nausea -I suspected her symptoms were initially secondary to fluid, respiratory illness followed by Flagyl -Discontinued Flagyl 12/27, stopped cefepime yesterday, abdominal exam is benign, prior cholecystectomy -Check a.m. cortisol and CT head -Continue PPI, add Ensure 3 times daily between meals -Supportive care, increase activity, will consider CT head if  severe nausea persists -Continue Scheduled Zofran and PRN Compazine   Hyperkalemia -resolved.   --Treated with Lokelma   AKI superimposed on CKD 3 on admission -Resolved, IV fluids discontinued - Hold Lasix 20 from PTA   Headache -Fioricet as needed if headache not responsive to Tylenol   Klebsiella/E. coli UTI - Received 3 doses ceftriaxone followed by 5 days of cefepime   SVT superimposed on chronic A-fib 2-1 Underlying CAD - Increased metoprolol to 37.5 XL  -Continue apixaban 5 twice daily   Contact dermatitis -Barrier cream ordered as well as nystatin and monitor   Type 2 diabetes - last A1c 9.5, associated with diabetic neuropathy - Continue insulin sliding scale coverage, sugars reasonable below 180 and drop metformin dose to 500 daily with breakfast - Continue Neurontin 300 twice daily   Chronic diastolic CHF -Lasix held, 2D echo EF 60 to 65%   H/o multiple strokes -Continue Plavix 75 daily   DVT prophylaxis: apixaban Code Status: DNR Family Communication: none present Disposition Plan: SNF in 1-2days   Consultants:    Procedures:   Antimicrobials:    Objective: Vitals:   03/07/22 0602 03/07/22 1355 03/07/22 2124 03/08/22 0607  BP: 135/66 130/64 (!) 141/59 (!) 114/52  Pulse: 70 70 75 71  Resp: '16 18 17 18  '$ Temp: (!) 97.5 F (36.4 C) (!) 97.4 F (36.3 C) (!) 97.5 F (36.4 C) (!) 97.4 F (36.3 C)  TempSrc: Oral  Oral Oral  SpO2: 94% 99% 99% 91%  Weight:      Height:        Intake/Output Summary (Last 24 hours) at 03/08/2022 0847 Last data filed at 03/08/2022 2979 Gross per 24 hour  Intake 550 ml  Output 800 ml  Net -250 ml   [employer]  03/04/22 0500 03/05/22 0500 03/06/22 0500  Weight: 62.2 kg 43 kg 47 kg    Examination:  General exam: Chronically ill elderly female laying in bed, eyes closed, HENT: Forehead, AAOx3 CVS: S1-S2, regular rhythm Lungs: Rare scattered rhonchi otherwise clear Abdomen: Soft, nontender, bowel sounds  present Extremities: No edema Skin: No rashes Psychiatry:  Mood & affect appropriate.     Data Reviewed:   CBC: Recent Labs  Lab 03/02/22 0405 03/03/22 0342 03/04/22 0358 03/07/22 0423 03/08/22 0349  WBC 6.1 6.4 6.1 5.1 5.8  HGB 10.7* 9.8* 11.2* 10.2* 10.2*  HCT 33.5* 31.3* 35.4* 31.7* 31.6*  MCV 93.8 94.3 93.4 92.2 93.5  PLT 197 191 231 213 412   Basic Metabolic Panel: Recent Labs  Lab 03/02/22 0405 03/03/22 0342 03/04/22 0358 03/05/22 0331 03/06/22 0412 03/07/22 0423 03/08/22 0349  NA 142   < > 139 139 138 140 136  K 5.4*   < > 4.6 4.1 3.8 3.7 3.9  CL 117*   < > 115* 111 108 109 108  CO2 19*   < > 21* '23 26 27 25  '$ GLUCOSE 145*   < > 102* 87 179* 170* 196*  BUN 5*   < > 6* 8 7* 8 13  CREATININE 1.41*   < > 1.15* 1.09* 1.05* 1.07* 1.29*  CALCIUM 8.2*   < > 8.3* 8.4* 8.4* 8.2* 8.1*  MG 2.0  --  1.9  --  1.3*  --   --   PHOS  --   --   --   --  3.1  --   --    < > = values in this interval not displayed.   GFR: Estimated Creatinine Clearance: 26.7 mL/min (A) (by C-G formula based on SCr of 1.29 mg/dL (H)). Liver Function Tests: Recent Labs  Lab 03/02/22 0405 03/07/22 0423 03/08/22 0349  AST 12* 24 30  ALT '11 12 15  '$ ALKPHOS 61 57 65  BILITOT 0.4 0.5 0.5  PROT 4.9* 4.7* 4.8*  ALBUMIN 2.5* 2.2* 2.1*   No results for input(s): "LIPASE", "AMYLASE" in the last 168 hours. No results for input(s): "AMMONIA" in the last 168 hours. Coagulation Profile: No results for input(s): "INR", "PROTIME" in the last 168 hours. Cardiac Enzymes: No results for input(s): "CKTOTAL", "CKMB", "CKMBINDEX", "TROPONINI" in the last 168 hours. BNP (last 3 results) No results for input(s): "PROBNP" in the last 8760 hours. HbA1C: No results for input(s): "HGBA1C" in the last 72 hours. CBG: Recent Labs  Lab 03/07/22 0749 03/07/22 1142 03/07/22 1630 03/07/22 2126 03/08/22 0834  GLUCAP 180* 262* 130* 122* 155*   Lipid Profile: No results for input(s): "CHOL", "HDL",  "LDLCALC", "TRIG", "CHOLHDL", "LDLDIRECT" in the last 72 hours. Thyroid Function Tests: No results for input(s): "TSH", "T4TOTAL", "FREET4", "T3FREE", "THYROIDAB" in the last 72 hours. Anemia Panel: No results for input(s): "VITAMINB12", "FOLATE", "FERRITIN", "TIBC", "IRON", "RETICCTPCT" in the last 72 hours. Urine analysis:    Component Value Date/Time   COLORURINE AMBER (A) 02/24/2022 1052   APPEARANCEUR CLOUDY (A) 02/24/2022 1052   LABSPEC 1.018 02/24/2022 1052   PHURINE 5.0 02/24/2022 1052   GLUCOSEU 150 (A) 02/24/2022 1052   HGBUR LARGE (A) 02/24/2022 1052   BILIRUBINUR NEGATIVE 02/24/2022 1052   BILIRUBINUR negative 06/13/2020 1552   KETONESUR 20 (A) 02/24/2022 1052   PROTEINUR 30 (A) 02/24/2022 1052   UROBILINOGEN 0.2 06/13/2020 1552   UROBILINOGEN 0.2 09/16/2014 0009   NITRITE POSITIVE (A) 02/24/2022 1052   LEUKOCYTESUR LARGE (A)  02/24/2022 1052   Sepsis Labs: '@LABRCNTIP'$ (procalcitonin:4,lacticidven:4)  ) Recent Results (from the past 240 hour(s))  Urine Culture     Status: Abnormal   Collection Time: 02/26/22 12:07 PM   Specimen: Urine, Clean Catch  Result Value Ref Range Status   Specimen Description   Final    URINE, CLEAN CATCH Performed at [facility], [place] [address], [place], [place] [address]    Special Requests   Final    NONE Performed at [facility], [place] [address] [place], [place] [address]    Culture (A)  Final    >=100,000 COLONIES/mL KLEBSIELLA PNEUMONIAE 60,000 COLONIES/mL ESCHERICHIA COLI    Report Status 02/28/2022 FINAL  Final   Organism ID, Bacteria KLEBSIELLA PNEUMONIAE (A)  Final   Organism ID, Bacteria ESCHERICHIA COLI (A)  Final      Susceptibility   Escherichia coli - MIC*    AMPICILLIN 16 INTERMEDIATE Intermediate     CEFAZOLIN <=4 SENSITIVE Sensitive     CEFEPIME <=0.12 SENSITIVE Sensitive     CEFTRIAXONE <=0.25 SENSITIVE Sensitive     CIPROFLOXACIN <=0.25 SENSITIVE Sensitive     GENTAMICIN  <=1 SENSITIVE Sensitive     IMIPENEM <=0.25 SENSITIVE Sensitive     NITROFURANTOIN <=16 SENSITIVE Sensitive     TRIMETH/SULFA <=20 SENSITIVE Sensitive     AMPICILLIN/SULBACTAM 4 SENSITIVE Sensitive     PIP/TAZO <=4 SENSITIVE Sensitive     * 60,000 COLONIES/mL ESCHERICHIA COLI   Klebsiella pneumoniae - MIC*    AMPICILLIN >=32 RESISTANT Resistant     CEFAZOLIN <=4 SENSITIVE Sensitive     CEFEPIME <=0.12 SENSITIVE Sensitive     CEFTRIAXONE <=0.25 SENSITIVE Sensitive     CIPROFLOXACIN <=0.25 SENSITIVE Sensitive     GENTAMICIN <=1 SENSITIVE Sensitive     IMIPENEM 0.5 SENSITIVE Sensitive     NITROFURANTOIN 64 INTERMEDIATE Intermediate     TRIMETH/SULFA <=20 SENSITIVE Sensitive     AMPICILLIN/SULBACTAM 4 SENSITIVE Sensitive     PIP/TAZO <=4 SENSITIVE Sensitive     * >=100,000 COLONIES/mL KLEBSIELLA PNEUMONIAE     Radiology Studies: No results found.   Scheduled Meds:  apixaban  5 mg Oral BID   atorvastatin  40 mg Oral QPC supper   clopidogrel  75 mg Oral Daily   DULoxetine  30 mg Oral Daily   feeding supplement  237 mL Oral TID BM   gabapentin  300 mg Oral BID   Gerhardt's butt cream   Topical TID   insulin aspart  0-15 Units Subcutaneous TID WC   isosorbide mononitrate  15 mg Oral Q breakfast   melatonin  5 mg Oral QHS   metoprolol succinate  37.5 mg Oral Daily   nystatin   Topical BID   ondansetron (ZOFRAN) IV  4 mg Intravenous TID AC   pantoprazole  40 mg Oral BID AC   silver sulfADIAZINE   Topical Daily   zinc oxide   Topical BID   Continuous Infusions:  sodium chloride 10 mL/hr at 03/06/22 0507   sodium chloride Stopped (02/24/22 1129)     LOS: 13 days    Time spent: 110mn    PDomenic Polite MD [facility]   03/08/2022, 8:47 AM

## 2022-03-08 NOTE — Care Management Important Message (Signed)
Important Message  Patient Details IM Letter given. Name: Maria Fox MRN: [id number] Date of Birth: 06-13-1945   Medicare Important Message Given:  Yes     Kerin Salen 03/08/2022, 2:18 PM

## 2022-03-09 DIAGNOSIS — E860 Dehydration: Secondary | ICD-10-CM | POA: Diagnosis not present

## 2022-03-09 LAB — GLUCOSE, CAPILLARY
Glucose-Capillary: 184 mg/dL — ABNORMAL HIGH (ref 70–99)
Glucose-Capillary: 236 mg/dL — ABNORMAL HIGH (ref 70–99)
Glucose-Capillary: 201 mg/dL — ABNORMAL HIGH (ref 70–99)
Glucose-Capillary: 181 mg/dL — ABNORMAL HIGH (ref 70–99)

## 2022-03-09 LAB — BASIC METABOLIC PANEL
Anion gap: 8 (ref 5–15)
BUN: 21 mg/dL (ref 8–23)
CO2: 23 mmol/L (ref 22–32)
Calcium: 8.7 mg/dL — ABNORMAL LOW (ref 8.9–10.3)
Chloride: 107 mmol/L (ref 98–111)
Creatinine, Ser: 1.27 mg/dL — ABNORMAL HIGH (ref 0.44–1.00)
GFR, Estimated: 43 mL/min — ABNORMAL LOW (ref >60)
Glucose, Bld: 172 mg/dL — ABNORMAL HIGH (ref 70–99)
Potassium: 5.1 mmol/L (ref 3.5–5.1)
Sodium: 138 mmol/L (ref 135–145)

## 2022-03-09 LAB — SEDIMENTATION RATE: Sed Rate: 17 mm/hr (ref 0–22)

## 2022-03-09 LAB — CORTISOL: Cortisol, Plasma: 3.2 ug/dL

## 2022-03-09 MED ORDER — COSYNTROPIN 0.25 MG IJ SOLR
0.2500 mg | Freq: Once | INTRAMUSCULAR | Status: AC
  Administered 2022-03-10: 0.25 mg via INTRAVENOUS
  Filled 2022-03-09 (×2): qty 0.25

## 2022-03-09 MED ORDER — IBUPROFEN 400 MG PO TABS
400.0000 mg | ORAL_TABLET | Freq: Once | ORAL | Status: AC
  Administered 2022-03-09: 400 mg via ORAL
  Filled 2022-03-09: qty 1

## 2022-03-09 MED ORDER — VALPROATE SODIUM 100 MG/ML IV SOLN
100.0000 mg | Freq: Once | INTRAVENOUS | Status: AC
  Administered 2022-03-09: 100 mg via INTRAVENOUS
  Filled 2022-03-09: qty 1

## 2022-03-09 NOTE — Progress Notes (Addendum)
PROGRESS NOTE    Maria Fox  ERD:[id number] DOB: February 06, 1946 DOA: 02/23/2022 PCP: [name], MD  76/F w HTN HLD DM TY 2 prior CVA-multi infarcts and corona radiata during CABG-left PCA stroke 2017 Severe three-vessel CAD status post CABG 05/2014, subsequent PCI 11/13/2017 supposed to be 6 months dual antiplatelet, stage ii CKD,  chronic NSVT since 2016 EF 45% echo 03/27/2014, a flutter , DVT, Peripheral neuropathy, Prior buttock abscess status post incision and drainage 08/05/2020   Presented to ED 12/16 with a week long illness nausea vomiting diarrhea and weakness to the point of being unable to ambulate-found to have influenza A as well as dehydration   Hospital course complicated by development of superimposed bacterial pneumonia, treated with IV antibiotics.  Also with ongoing nausea vomiting and very poor p.o. intake, headache -12/27, Flagyl discontinued for nausea -12/28-cefepime discontinued, scheduled Zofran -12/29: CT head unremarkable, a.m. cortisol low -12/30: Cosyntropin stim test ordered   Subjective: Ate dinner last night, only few bites for lunch, drank 2 Ensure -Finally worked with physical therapy after declining all week, walked in the halls yesterday  Assessment and Plan:  Influenza A Superimposed bacterial pneumonia- based on CXR 12/22 Lactic acidosis -resolved -Tamiflu completed 12/21 -Started on Cefepime/Flagyl 12/22 -Discontinued cefepime and Flagyl, completed>7 days of antibiotics -continue nebs, pulm toilet -Improved and stable from the standpoint  Persistent nausea, headache -No further vomiting, continues to be debilitated by severe nausea -I suspected her symptoms were initially secondary to fluid, respiratory illness followed by Flagyl -Discontinued Flagyl 12/27, stopped cefepime 12/28, abdominal exam is benign, prior cholecystectomy -Continue PPI, add Ensure 3 times daily between meals -CT head yesterday was unremarkable, a.m. cortisol is low at  3.2 today -Vitals are stable, will check cosyntropin stim test tomorrow -Also check ESR, clinical suspicion for temporal arteritis is low -Continue Scheduled Zofran and PRN Compazine, trial of Depakote x 1   Hyperkalemia -resolved.   --Treated with Lokelma   AKI superimposed on CKD 3 on admission -Resolved, IV fluids discontinued - Hold Lasix 20 from PTA   Headache -Fioricet PRN, ibuprofen x 1   Klebsiella/E. coli UTI - Received 3 doses ceftriaxone followed by 5 days of cefepime   SVT superimposed on chronic A-fib 2-1 Underlying CAD - Continue metoprolol, apixaban   Contact dermatitis -Barrier cream ordered as well as nystatin and monitor   Type 2 diabetes - last A1c 9.5, associated with diabetic neuropathy - Continue insulin sliding scale coverage, sugars reasonable below 180 and drop metformin dose to 500 daily with breakfast - Continue Neurontin 300 twice daily   Chronic diastolic CHF -Lasix held, 2D echo EF 60 to 65%   H/o multiple strokes -Continue Plavix 75 daily   DVT prophylaxis: apixaban Code Status: DNR Family Communication: none present Disposition Plan: Home pending improvement in nausea  Consultants:    Procedures:   Antimicrobials:    Objective: Vitals:   03/08/22 0607 03/08/22 1311 03/08/22 2023 03/09/22 0530  BP: (!) 114/52 124/68 (!) 147/64 (!) 154/87  Pulse: 71 69 79 83  Resp: 18 (!) _0 Temp: (!) 97.4 F (36.3 C) 97.7 F (36.5 C) 98 F (36.7 C) 97.8 F (36.6 C)  TempSrc: Oral Oral Oral Oral  SpO2: 91% 96% 100% 95%  Weight:      Height:        Intake/Output Summary (Last 24 hours) at 03/09/2022 0844 Last data filed at 03/09/2022 5631 Gross per 24 hour  Intake 1410 ml  Output 675 ml  Net 735 ml   [employer]   03/04/22 0500 03/05/22 0500 03/06/22 0500  Weight: 62.2 kg 43 kg 47 kg    Examination:  General exam: Chronically ill elderly female laying in bed, eyes closed, HENT: Forehead, AAOx3 CVS: S1-S2, regular  rhythm Lungs: Rare scattered rhonchi otherwise clear Abdomen: Soft, nontender, bowel sounds present Extremities: No edema Skin: No rashes, vitiligo Psychiatry:  Mood & affect appropriate.     Data Reviewed:   CBC: Recent Labs  Lab 03/03/22 0342 03/04/22 0358 03/07/22 0423 03/08/22 0349  WBC 6.4 6.1 5.1 5.8  HGB 9.8* 11.2* 10.2* 10.2*  HCT 31.3* 35.4* 31.7* 31.6*  MCV 94.3 93.4 92.2 93.5  PLT 191 231 213 573   Basic Metabolic Panel: Recent Labs  Lab 03/04/22 0358 03/05/22 0331 03/06/22 0412 03/07/22 0423 03/08/22 0349 03/09/22 0340  NA 139 139 138 140 136 138  K 4.6 4.1 3.8 3.7 3.9 5.1  CL 115* 111 108 109 108 107  CO2 21* _0 GLUCOSE 102* 87 179* 170* 196* 172*  BUN 6* 8 7* _1 CREATININE 1.15* 1.09* 1.05* 1.07* 1.29* 1.27*  CALCIUM 8.3* 8.4* 8.4* 8.2* 8.1* 8.7*  MG 1.9  --  1.3*  --   --   --   PHOS  --   --  3.1  --   --   --    GFR: Estimated Creatinine Clearance: 27.1 mL/min (A) (by C-G formula based on SCr of 1.27 mg/dL (H)). Liver Function Tests: Recent Labs  Lab 03/07/22 0423 03/08/22 0349  AST 24 30  ALT 12 15  ALKPHOS 57 65  BILITOT 0.5 0.5  PROT 4.7* 4.8*  ALBUMIN 2.2* 2.1*   No results for input(s): "LIPASE", "AMYLASE" in the last 168 hours. No results for input(s): "AMMONIA" in the last 168 hours. Coagulation Profile: No results for input(s): "INR", "PROTIME" in the last 168 hours. Cardiac Enzymes: No results for input(s): "CKTOTAL", "CKMB", "CKMBINDEX", "TROPONINI" in the last 168 hours. BNP (last 3 results) No results for input(s): "PROBNP" in the last 8760 hours. HbA1C: No results for input(s): "HGBA1C" in the last 72 hours. CBG: Recent Labs  Lab 03/07/22 2126 03/08/22 0834 03/08/22 1152 03/08/22 1639 03/09/22 0747  GLUCAP 122* 155* 225* 132* 184*   Lipid Profile: No results for input(s): "CHOL", "HDL", "LDLCALC", "TRIG", "CHOLHDL", "LDLDIRECT" in the last 72 hours. Thyroid Function Tests: No results for  input(s): "TSH", "T4TOTAL", "FREET4", "T3FREE", "THYROIDAB" in the last 72 hours. Anemia Panel: No results for input(s): "VITAMINB12", "FOLATE", "FERRITIN", "TIBC", "IRON", "RETICCTPCT" in the last 72 hours. Urine analysis:    Component Value Date/Time   COLORURINE AMBER (A) 02/24/2022 1052   APPEARANCEUR CLOUDY (A) 02/24/2022 1052   LABSPEC 1.018 02/24/2022 1052   PHURINE 5.0 02/24/2022 1052   GLUCOSEU 150 (A) 02/24/2022 1052   HGBUR LARGE (A) 02/24/2022 1052   BILIRUBINUR NEGATIVE 02/24/2022 1052   BILIRUBINUR negative 06/13/2020 1552   KETONESUR 20 (A) 02/24/2022 1052   PROTEINUR 30 (A) 02/24/2022 1052   UROBILINOGEN 0.2 06/13/2020 1552   UROBILINOGEN 0.2 09/16/2014 0009   NITRITE POSITIVE (A) 02/24/2022 1052   LEUKOCYTESUR LARGE (A) 02/24/2022 1052   Sepsis Labs: _2 (procalcitonin:4,lacticidven:4)  ) No results found for this or any previous visit (from the past 240 hour(s)).    Radiology Studies: CT HEAD WO CONTRAST (5MM)  Result Date: 03/08/2022 CLINICAL DATA:  76 year old female with headache.  Migraine. EXAM: CT HEAD WITHOUT CONTRAST TECHNIQUE: Contiguous axial  images were obtained from the base of the skull through the vertex without intravenous contrast. RADIATION DOSE REDUCTION: This exam was performed according to the departmental dose-optimization program which includes automated exposure control, adjustment of the mA and/or kV according to patient size and/or use of iterative reconstruction technique. COMPARISON:  Brain MRI 07/18/2019.  Head CT 07/16/2019. FINDINGS: Brain: Chronic left PCA territory infarct with encephalomalacia is stable from 2 years ago. No midline shift, ventriculomegaly, mass effect, evidence of mass lesion, intracranial hemorrhage or evidence of cortically based acute infarction. Asymmetric Patchy and confluent cerebral white matter hypodensity appears chronic but increased in the bilateral corona radiata greater on the left. Small chronic  thalamic lacunar infarcts were better demonstrated by MRI. Vascular: Extensive Calcified atherosclerosis at the skull base. No suspicious intracranial vascular hyperdensity. Chronic basilar artery tortuosity. Skull: No acute osseous abnormality identified. Sinuses/Orbits: Visualized paranasal sinuses and mastoids are stable and well aerated. Tympanic cavities are clear. Other: No acute orbit or scalp soft tissue finding. IMPRESSION: 1. No acute intracranial abnormality identified. 2. Chronic Left PCA territory infarct, and progression of cerebral white matter small vessel disease since 2021. Electronically Signed   By: Genevie Ann M.D.   On: 03/08/2022 11:46     Scheduled Meds:  apixaban  5 mg Oral BID   atorvastatin  40 mg Oral QPC supper   clopidogrel  75 mg Oral Daily   DULoxetine  30 mg Oral Daily   feeding supplement  237 mL Oral TID BM   gabapentin  300 mg Oral BID   Gerhardt's butt cream   Topical TID   ibuprofen  400 mg Oral Once   insulin aspart  0-15 Units Subcutaneous TID WC   isosorbide mononitrate  15 mg Oral Q breakfast   melatonin  5 mg Oral QHS   metoprolol succinate  37.5 mg Oral Daily   nystatin   Topical BID   ondansetron (ZOFRAN) IV  4 mg Intravenous TID AC   pantoprazole  40 mg Oral BID AC   silver sulfADIAZINE   Topical Daily   zinc oxide   Topical BID   Continuous Infusions:  sodium chloride 10 mL/hr at 03/06/22 0507   sodium chloride Stopped (02/24/22 1129)   valproate sodium       LOS: 14 days    Time spent: 65mn    PDomenic Polite MD [facility]   03/09/2022, 8:44 AM

## 2022-03-10 DIAGNOSIS — E860 Dehydration: Secondary | ICD-10-CM | POA: Diagnosis not present

## 2022-03-10 LAB — BASIC METABOLIC PANEL
Anion gap: 7 (ref 5–15)
BUN: 21 mg/dL (ref 8–23)
CO2: 27 mmol/L (ref 22–32)
Calcium: 8.7 mg/dL — ABNORMAL LOW (ref 8.9–10.3)
Chloride: 102 mmol/L (ref 98–111)
Creatinine, Ser: 1.63 mg/dL — ABNORMAL HIGH (ref 0.44–1.00)
GFR, Estimated: 32 mL/min — ABNORMAL LOW (ref >60)
Glucose, Bld: 206 mg/dL — ABNORMAL HIGH (ref 70–99)
Potassium: 3.9 mmol/L (ref 3.5–5.1)
Sodium: 136 mmol/L (ref 135–145)

## 2022-03-10 LAB — GLUCOSE, CAPILLARY
Glucose-Capillary: 219 mg/dL — ABNORMAL HIGH (ref 70–99)
Glucose-Capillary: 402 mg/dL — ABNORMAL HIGH (ref 70–99)
Glucose-Capillary: 208 mg/dL — ABNORMAL HIGH (ref 70–99)
Glucose-Capillary: 148 mg/dL — ABNORMAL HIGH (ref 70–99)

## 2022-03-10 LAB — ACTH STIMULATION, 3 TIME POINTS
Cortisol, 30 Min: 19 ug/dL
Cortisol, 60 Min: 24.9 ug/dL
Cortisol, Base: 15.2 ug/dL

## 2022-03-10 MED ORDER — INSULIN ASPART 100 UNIT/ML IJ SOLN: 18.0000 [IU] | Freq: Once | INTRAMUSCULAR | Status: AC

## 2022-03-10 MED ORDER — GLUCERNA SHAKE PO LIQD
237.0000 mL | Freq: Three times a day (TID) | ORAL | Status: DC
  Administered 2022-03-10 – 2022-03-12 (×5): 237 mL via ORAL
  Filled 2022-03-10 (×8): qty 237

## 2022-03-10 MED ORDER — LACTATED RINGERS IV SOLN: INTRAVENOUS | Status: DC

## 2022-03-10 NOTE — Progress Notes (Signed)
[facility] Progress Note Patient: Maria Fox [id number]:[id number] DOB: [birth year] DOA: 02/23/2022  DOS: the patient was seen and examined on 03/10/2022  Brief hospital course: PMH of HTN, CAD SP CABG, CVA, type II DM, HLD, PAD, CKD, NSVT, DVT, neuropathy presented to the hospital with complaints of nausea vomiting diarrhea and influenza infection Assessment and Plan: Influenza A. Possible bacterial pneumonia. Lactic acidosis currently resolved. Completed Tamiflu. Completed antibiotics for pneumonia as well. Continue respiratory supportive care.  Persistent nausea. Initially thought to be secondary to Flagyl which is currently discontinued. Other antibiotic completed as well. History of cholecystectomy. CT head negative. Cortisol level was low but cosyntropin stimulation test is negative. ESR normal. Currently no focal deficit. Monitor and supportive to correct.  Hyperkalemia. Treated with Lokelma.  AKI on CKD 3A. Serum creatinine relatively stable. Will monitor.  E. coli Klebsiella UTI. Treated with ceftriaxone.  Headache. Continue Fioricet as needed.  Type II DM, uncontrolled with hyperglycemia with nephropathy. Hemoglobin A1c 9.5. Continue sliding scale insulin. Switch Ensure to Glucerna due to hyperglycemia today.  Prior history of CVA. History of CAD. Continue Plavix.  Chronic diastolic CHF. Volume status adequate. Monitor.  History of DVT. On Eliquis.  Continue.   Subjective: Continues to have nausea.  No nausea or vomiting.  No abdominal pain.  Passing gas.  Mobility improving.  Physical Exam: General: in Mild distress, No Rash Cardiovascular: S1 and S2 Present, No Murmur Respiratory: Good respiratory effort, Bilateral Air entry present. No Crackles, No wheezes Abdomen: Bowel Sound present, No tenderness Extremities: No edema Neuro: Alert and oriented x3, no new focal deficit  Data Reviewed: I have Reviewed nursing notes, Vitals, and Lab  results. Since last encounter, pertinent lab results CBC and BMP in 6 cosyntropin stimulation test   . I have ordered test including CBC and BMP  .   Disposition: Status is: Inpatient Remains inpatient appropriate because: Monitor for improvement in oral intake PT recommended home health. apixaban (ELIQUIS) tablet 5 mg   Family Communication: No one at bedside Level of care: Med-Surg Vitals:   03/09/22 1330 03/09/22 2117 03/10/22 0500 03/10/22 0520  BP: 132/67 (!) 136/92  129/86  Pulse: 69 70  74  Resp: _0 Temp: 97.8 F (36.6 C) (!) 97.3 F (36.3 C)  98.6 F (37 C)  TempSrc: Oral Oral  Oral  SpO2: 94% 93%  96%  Weight:   63.2 kg   Height:         Author: Berle Mull, MD 03/10/2022 5:56 PM  Please look on [email or web address] to find out who is on call.

## 2022-03-10 NOTE — Progress Notes (Signed)
Mobility Specialist - Progress Note   03/10/22 1217  Mobility  Activity Ambulated with assistance in hallway  Level of Assistance Standby assist, set-up cues, supervision of patient - no hands on  Assistive Device Front wheel walker  Distance Ambulated (ft) 260 ft  Activity Response Tolerated well  Mobility Referral Yes  $Mobility charge 1 Mobility   Pt received in bed and agreeable to mobility. No complaints during mobility. Pt to bed after session with all needs met.    [facility]

## 2022-03-10 NOTE — Plan of Care (Signed)
  Problem: Clinical Measurements: Goal: Ability to maintain clinical measurements within normal limits will improve Outcome: Adequate for Discharge Goal: Diagnostic test results will improve Outcome: Adequate for Discharge   Problem: Activity: Goal: Risk for activity intolerance will decrease Outcome: Adequate for Discharge   Problem: Nutrition: Goal: Adequate nutrition will be maintained Outcome: Adequate for Discharge   Problem: Elimination: Goal: Will not experience complications related to bowel motility Outcome: Adequate for Discharge   Problem: Skin Integrity: Goal: Risk for impaired skin integrity will decrease Outcome: Adequate for Discharge

## 2022-03-11 DIAGNOSIS — E860 Dehydration: Secondary | ICD-10-CM | POA: Diagnosis not present

## 2022-03-11 LAB — COMPREHENSIVE METABOLIC PANEL
ALT: 25 U/L (ref 0–44)
AST: 38 U/L (ref 15–41)
Albumin: 3.1 g/dL — ABNORMAL LOW (ref 3.5–5.0)
Alkaline Phosphatase: 66 U/L (ref 38–126)
Anion gap: 7 (ref 5–15)
BUN: 22 mg/dL (ref 8–23)
CO2: 27 mmol/L (ref 22–32)
Calcium: 8.5 mg/dL — ABNORMAL LOW (ref 8.9–10.3)
Chloride: 100 mmol/L (ref 98–111)
Creatinine, Ser: 1.22 mg/dL — ABNORMAL HIGH (ref 0.44–1.00)
GFR, Estimated: 45 mL/min — ABNORMAL LOW (ref >60)
Glucose, Bld: 305 mg/dL — ABNORMAL HIGH (ref 70–99)
Potassium: 3.7 mmol/L (ref 3.5–5.1)
Sodium: 134 mmol/L — ABNORMAL LOW (ref 135–145)
Total Bilirubin: 0.4 mg/dL (ref 0.3–1.2)
Total Protein: 5.9 g/dL — ABNORMAL LOW (ref 6.5–8.1)

## 2022-03-11 LAB — CBC WITH DIFFERENTIAL/PLATELET
Abs Immature Granulocytes: 0.02 10*3/uL (ref 0.00–0.07)
Basophils Absolute: 0.1 10*3/uL (ref 0.0–0.1)
Basophils Relative: 1 %
Eosinophils Absolute: 0.2 10*3/uL (ref 0.0–0.5)
Eosinophils Relative: 4 %
HCT: 33.1 % — ABNORMAL LOW (ref 36.0–46.0)
Hemoglobin: 10.7 g/dL — ABNORMAL LOW (ref 12.0–15.0)
Immature Granulocytes: 0 %
Lymphocytes Relative: 28 %
Lymphs Abs: 1.5 10*3/uL (ref 0.7–4.0)
MCH: 29.7 pg (ref 26.0–34.0)
MCHC: 32.3 g/dL (ref 30.0–36.0)
MCV: 91.9 fL (ref 80.0–100.0)
Monocytes Absolute: 0.6 10*3/uL (ref 0.1–1.0)
Monocytes Relative: 10 %
Neutro Abs: 3.2 10*3/uL (ref 1.7–7.7)
Neutrophils Relative %: 57 %
Platelets: 191 10*3/uL (ref 150–400)
RBC: 3.6 MIL/uL — ABNORMAL LOW (ref 3.87–5.11)
RDW: 14.9 % (ref 11.5–15.5)
WBC: 5.5 10*3/uL (ref 4.0–10.5)
nRBC: 0 % (ref 0.0–0.2)

## 2022-03-11 LAB — GLUCOSE, CAPILLARY
Glucose-Capillary: 181 mg/dL — ABNORMAL HIGH (ref 70–99)
Glucose-Capillary: 259 mg/dL — ABNORMAL HIGH (ref 70–99)
Glucose-Capillary: 242 mg/dL — ABNORMAL HIGH (ref 70–99)
Glucose-Capillary: 91 mg/dL (ref 70–99)

## 2022-03-11 LAB — MAGNESIUM: Magnesium: 1.7 mg/dL (ref 1.7–2.4)

## 2022-03-11 MED ORDER — GLIPIZIDE 5 MG PO TABS
5.0000 mg | ORAL_TABLET | Freq: Two times a day (BID) | ORAL | Status: DC
  Administered 2022-03-11 – 2022-03-12 (×2): 5 mg via ORAL
  Filled 2022-03-11 (×2): qty 1

## 2022-03-11 MED ORDER — GLIPIZIDE ER 5 MG PO TB24: 10.0000 mg | ORAL_TABLET | Freq: Every day | ORAL | Status: DC

## 2022-03-11 MED ORDER — ZOLPIDEM TARTRATE 5 MG PO TABS
5.0000 mg | ORAL_TABLET | Freq: Every day | ORAL | Status: DC
  Administered 2022-03-11: 5 mg via ORAL
  Filled 2022-03-11: qty 1

## 2022-03-11 MED ORDER — DORZOLAMIDE HCL-TIMOLOL MAL 2-0.5 % OP SOLN
1.0000 [drp] | Freq: Two times a day (BID) | OPHTHALMIC | Status: DC
  Administered 2022-03-11 – 2022-03-12 (×3): 1 [drp] via OPHTHALMIC
  Filled 2022-03-11: qty 10

## 2022-03-11 NOTE — Progress Notes (Signed)
Mobility Specialist - Progress Note   03/11/22 1126  Mobility  Activity Ambulated with assistance in hallway  Level of Assistance Modified independent, requires aide device or extra time  Assistive Device Front wheel walker  Distance Ambulated (ft) 300 ft  Activity Response Tolerated well  Mobility Referral Yes  $Mobility charge 1 Mobility   Pt received in bed and agreeable to mobility. Pt requires minimal assistance going from laying to sitting up. No complaints during mobility session. Pt to bed after session with all needs met.    [facility]

## 2022-03-11 NOTE — Progress Notes (Addendum)
[facility] Progress Note Patient: Maria Fox ZOX:[id number] DOB: 1946/03/02 DOA: 02/23/2022  DOS: the patient was seen and examined on 03/11/2022  Brief hospital course: PMH of HTN, CAD SP CABG, CVA, type II DM, HLD, PAD, CKD, NSVT, DVT, neuropathy presented to the hospital with complaints of nausea vomiting diarrhea and influenza infection Assessment and Plan: Influenza A. Possible bacterial pneumonia. Lactic acidosis currently resolved. Completed Tamiflu. Completed antibiotics for pneumonia as well. Continue respiratory supportive care.  Persistent nausea. Initially thought to be secondary to Flagyl which is currently discontinued. Other antibiotic completed as well. History of cholecystectomy. CT head negative. Cortisol level was low but cosyntropin stimulation test is negative. ESR normal. Currently no focal deficit. Now able to tolerate oral diet despite having nausea.  Currently no indication for EGD or GI consultation. Recommend outpatient follow-up with PCP and if has persistent symptoms beyond 2 weeks recommend GI follow-up outpatient.  Headache. From not able to sleep as well as from vision issues without glasses. Initiate Ambien.  Resume home eyedrops.  Hyperkalemia. Treated with Lokelma.  AKI on CKD 3A. Serum Crea and was stable but worsened again to 1.6.  Required IV fluids.  Now serum creatinine improving.  Monitor for improvement in serum creatinine off of IV fluid overnight. Will monitor.  E. coli Klebsiella UTI. Treated with ceftriaxone.  Type II DM, uncontrolled with hyperglycemia with nephropathy. Hemoglobin A1c 9.5. Continue sliding scale insulin. Switch Ensure to Glucerna due to hyperglycemia. Resume home glipizide.  Switch to carb modified diet from soft diet.  Prior history of CVA. History of CAD. Continue Plavix.  Chronic diastolic CHF. Volume status adequate. Monitor.  History of DVT. On Eliquis.  Continue.   Subjective: Nausea is  not resolved.  No vomiting.  Had a bowel movement.  No abdominal pain.  No fever no chills.  No shortness of breath.  No dizziness or lightheadedness.  Able to ambulate 300 feet.  Ate well.  Physical Exam: General: in Mild distress, No Rash Cardiovascular: S1 and S2 Present, No Murmur Respiratory: Good respiratory effort, Bilateral Air entry present. No Crackles, No wheezes Abdomen: Bowel Sound present, No tenderness Extremities: No edema Neuro: Alert and oriented x3, no new focal deficit  Data Reviewed: I have Reviewed nursing notes, Vitals, and Lab results. Since last encounter, pertinent lab results CBC and BMP   . I have ordered test including CBC and BMP  .    Disposition: Status is: Inpatient Remains inpatient appropriate because: Monitor for improvement in oral intake PT recommended home health.  Likely home tomorrow.  apixaban (ELIQUIS) tablet 5 mg   Family Communication: No one at bedside Level of care: Med-Surg Vitals:   03/10/22 0520 03/10/22 2201 03/11/22 0507 03/11/22 1438  BP: 129/86 (!) 155/70 (!) 171/85 131/66  Pulse: 74 71 74 64  Resp: _0 Temp: 98.6 F (37 C) 97.6 F (36.4 C) 97.6 F (36.4 C) (!) 97.4 F (36.3 C)  TempSrc: Oral Oral Oral Oral  SpO2: 96% 95% 96% 98%  Weight:      Height:         Author: Berle Mull, MD 03/11/2022 5:55 PM  Please look on [email or web address] to find out who is on call.

## 2022-03-11 NOTE — Progress Notes (Addendum)
Verified with pharmacist Royetta Asal that patient has previously received and should continue to receive both Eliquis and Plavix. Ivan Anchors, RN 03/11/22 3:37 PM

## 2022-03-11 NOTE — Plan of Care (Signed)
Problem: Clinical Measurements: Goal: Ability to maintain clinical measurements within normal limits will improve Outcome: Progressing   Problem: Activity: Goal: Risk for activity intolerance will decrease Outcome: Progressing  Problem: Nutrition: Goal: Adequate nutrition will be maintained Outcome: Progressing   Ivan Anchors, RN 03/11/22 11:03 AM

## 2022-03-12 DIAGNOSIS — E860 Dehydration: Secondary | ICD-10-CM | POA: Diagnosis not present

## 2022-03-12 LAB — CBC WITH DIFFERENTIAL/PLATELET
Abs Immature Granulocytes: 0.02 10*3/uL (ref 0.00–0.07)
Basophils Absolute: 0.1 10*3/uL (ref 0.0–0.1)
Basophils Relative: 1 %
Eosinophils Absolute: 0.3 10*3/uL (ref 0.0–0.5)
Eosinophils Relative: 5 %
HCT: 33.4 % — ABNORMAL LOW (ref 36.0–46.0)
Hemoglobin: 10.8 g/dL — ABNORMAL LOW (ref 12.0–15.0)
Immature Granulocytes: 0 %
Lymphocytes Relative: 35 %
Lymphs Abs: 1.9 10*3/uL (ref 0.7–4.0)
MCH: 29.5 pg (ref 26.0–34.0)
MCHC: 32.3 g/dL (ref 30.0–36.0)
MCV: 91.3 fL (ref 80.0–100.0)
Monocytes Absolute: 0.6 10*3/uL (ref 0.1–1.0)
Monocytes Relative: 11 %
Neutro Abs: 2.5 10*3/uL (ref 1.7–7.7)
Neutrophils Relative %: 48 %
Platelets: 212 10*3/uL (ref 150–400)
RBC: 3.66 MIL/uL — ABNORMAL LOW (ref 3.87–5.11)
RDW: 15 % (ref 11.5–15.5)
WBC: 5.4 10*3/uL (ref 4.0–10.5)
nRBC: 0 % (ref 0.0–0.2)

## 2022-03-12 LAB — COMPREHENSIVE METABOLIC PANEL
ALT: 30 U/L (ref 0–44)
AST: 44 U/L — ABNORMAL HIGH (ref 15–41)
Albumin: 3 g/dL — ABNORMAL LOW (ref 3.5–5.0)
Alkaline Phosphatase: 78 U/L (ref 38–126)
Anion gap: 8 (ref 5–15)
BUN: 30 mg/dL — ABNORMAL HIGH (ref 8–23)
CO2: 25 mmol/L (ref 22–32)
Calcium: 8.6 mg/dL — ABNORMAL LOW (ref 8.9–10.3)
Chloride: 101 mmol/L (ref 98–111)
Creatinine, Ser: 1.24 mg/dL — ABNORMAL HIGH (ref 0.44–1.00)
GFR, Estimated: 45 mL/min — ABNORMAL LOW (ref >60)
Glucose, Bld: 117 mg/dL — ABNORMAL HIGH (ref 70–99)
Potassium: 3.7 mmol/L (ref 3.5–5.1)
Sodium: 134 mmol/L — ABNORMAL LOW (ref 135–145)
Total Bilirubin: 0.6 mg/dL (ref 0.3–1.2)
Total Protein: 6.2 g/dL — ABNORMAL LOW (ref 6.5–8.1)

## 2022-03-12 LAB — GLUCOSE, CAPILLARY
Glucose-Capillary: 113 mg/dL — ABNORMAL HIGH (ref 70–99)
Glucose-Capillary: 171 mg/dL — ABNORMAL HIGH (ref 70–99)

## 2022-03-12 LAB — MAGNESIUM: Magnesium: 1.7 mg/dL (ref 1.7–2.4)

## 2022-03-12 MED ORDER — MELATONIN 5 MG PO TABS: 5.0000 mg | ORAL_TABLET | Freq: Every day | ORAL | 0 refills | Status: DC

## 2022-03-12 MED ORDER — ONDANSETRON 4 MG PO TBDP: 4.0000 mg | ORAL_TABLET | Freq: Three times a day (TID) | ORAL | 1 refills | Status: DC | PRN

## 2022-03-12 MED ORDER — DULOXETINE HCL 60 MG PO CPEP
60.0000 mg | ORAL_CAPSULE | Freq: Every day | ORAL | Status: DC
  Administered 2022-03-12: 60 mg via ORAL
  Filled 2022-03-12: qty 1

## 2022-03-12 MED ORDER — METOPROLOL SUCCINATE ER 25 MG PO TB24: 37.5000 mg | ORAL_TABLET | Freq: Every day | ORAL | 0 refills | Status: DC

## 2022-03-12 MED ORDER — DULOXETINE HCL 60 MG PO CPEP: 60.0000 mg | ORAL_CAPSULE | Freq: Every day | ORAL | 0 refills | Status: DC

## 2022-03-12 MED ORDER — METOCLOPRAMIDE HCL 5 MG/ML IJ SOLN
10.0000 mg | Freq: Once | INTRAMUSCULAR | Status: AC
  Administered 2022-03-12: 10 mg via INTRAVENOUS
  Filled 2022-03-12: qty 2

## 2022-03-12 MED ORDER — BENZONATATE 100 MG PO CAPS: 100.0000 mg | ORAL_CAPSULE | Freq: Three times a day (TID) | ORAL | 0 refills | Status: DC | PRN

## 2022-03-12 MED ORDER — KETOROLAC TROMETHAMINE 15 MG/ML IJ SOLN
15.0000 mg | Freq: Once | INTRAMUSCULAR | Status: AC
  Administered 2022-03-12: 15 mg via INTRAVENOUS
  Filled 2022-03-12: qty 1

## 2022-03-12 MED ORDER — BUTALBITAL-APAP-CAFFEINE 50-325-40 MG PO TABS: 1.0000 | ORAL_TABLET | Freq: Four times a day (QID) | ORAL | 0 refills | Status: DC | PRN

## 2022-03-12 NOTE — Progress Notes (Signed)
Physical Therapy Treatment Patient Details Name: Maria Fox MRN: [id number] DOB: May 12, 1945 Today's Date: 03/12/2022   History of Present Illness Patient is a 77 year old female who presented with a week history of nausea and vomiting with diarrhea. Patient was admitted with influenza A, dehydration, contact dermatitis. PMH: CKD II, DM, CVA 2016, CABG, L PCA stroke 2017, COVID 19, RBBB, autoimmune deficiency syndrome.    PT Comments    Pt is scheduled to D/C to home later today.  General Comments: AxO x 3 cooperative today, feeling "better" but not yet 100%.  Pt plans to return home with assistance from [place] her significant other whom she has been with past 35 years. Assisted OOB to amb in hallway.  Pt self able to get OOB with increased time.  General transfer comment: mild unsteady with use of hands and increased time. Declines need for a walker. General Gait Details: assisted with amb in hallway a short distance to stairs.  Declines need for a walker.  Mild unsteadiness.  Holding to door frame and edge of bed.  Pt stated, "I have a walker if I feel like I need it".  Pt most likely a "furniture walker'. Practiced stairs.  General stair comments: pt safely able to navigate stairs with use of B rails alternating feet and use of rails.   Pt has a walker and a cane at home.   Recommendations for follow up therapy are one component of a multi-disciplinary discharge planning process, led by the attending physician.  Recommendations may be updated based on patient status, additional functional criteria and insurance authorization.  Follow Up Recommendations  Home health PT     Assistance Recommended at Discharge Frequent or constant Supervision/Assistance  Patient can return home with the following A little help with walking and/or transfers;A little help with bathing/dressing/bathroom   Equipment Recommendations  None recommended by PT    Recommendations for Other Services        Precautions / Restrictions Precautions Precautions: Fall Precaution Comments: incontinent (bladder, bowel); dizziness per OT earlier today Restrictions Weight Bearing Restrictions: No     Mobility  Bed Mobility Overal bed mobility: Needs Assistance Bed Mobility: Supine to Sit, Sit to Supine     Supine to sit: Supervision Sit to supine: Supervision   General bed mobility comments: pt self able to get to EOB and back into bed.  Requires increased time.    Transfers Overall transfer level: Needs assistance Equipment used: 1 person hand held assist Transfers: Sit to/from Stand Sit to Stand: Supervision, Min guard           General transfer comment: mild unsteady with use of hands and increased time. Declines need for a walker.    Ambulation/Gait Ambulation/Gait assistance: Supervision, Min guard Gait Distance (Feet): 45 Feet Assistive device: 1 person hand held assist Gait Pattern/deviations: Step-through pattern, Decreased stride length Gait velocity: decreased     General Gait Details: assisted with amb in hallway a short distance to stairs.  Declines need for a walker.  Mild unsteadiness.  Holding to door frame and edge of bed.  Pt stated, "I have a walker if I feel like I need it".  Pt most likely a "furniture walker'.   Stairs Stairs: Yes Stairs assistance: Supervision, Min guard Stair Management: Two rails, Step to pattern, Alternating pattern, Forwards Number of Stairs: 5 General stair comments: pt safely able to navigate stairs with use of B rails   Wheelchair Mobility    Modified Rankin (Stroke  Patients Only)       Balance                                            Cognition Arousal/Alertness: Awake/alert Behavior During Therapy: WFL for tasks assessed/performed Overall Cognitive Status: Within Functional Limits for tasks assessed                                 General Comments: AxO x 3 cooperative today,  feeling "better" but not yet 100%.  Pt plans to return home with assistance from [place] her significant other whom she has been with past 35 years.        Exercises      General Comments        Pertinent Vitals/Pain Pain Assessment Pain Assessment: No/denies pain    Home Living                          Prior Function            PT Goals (current goals can now be found in the care plan section) Progress towards PT goals: Progressing toward goals    Frequency    Min 3X/week      PT Plan Current plan remains appropriate    Co-evaluation              AM-PAC PT "6 Clicks" Mobility   Outcome Measure  Help needed turning from your back to your side while in a flat bed without using bedrails?: A Little Help needed moving from lying on your back to sitting on the side of a flat bed without using bedrails?: A Little Help needed moving to and from a bed to a chair (including a wheelchair)?: A Little Help needed standing up from a chair using your arms (e.g., wheelchair or bedside chair)?: A Little Help needed to walk in hospital room?: A Little Help needed climbing 3-5 steps with a railing? : A Little 6 Click Score: 18    End of Session Equipment Utilized During Treatment: Gait belt Activity Tolerance: Patient limited by fatigue Patient left: in bed;with call bell/phone within reach;with bed alarm set Nurse Communication: Mobility status PT Visit Diagnosis: Muscle weakness (generalized) (M62.81);Difficulty in walking, not elsewhere classified (R26.2)     Time: 3235-5732 PT Time Calculation (min) (ACUTE ONLY): 13 min  Charges:  $Gait Training: 8-22 mins                     {Elyjah Hazan  PTA Acute  [employer] M-F          [phone] Weekend pager [phone]

## 2022-03-12 NOTE — Progress Notes (Signed)
Occupational Therapy Treatment Patient Details Name: Maria Fox MRN: [id number] DOB: 1945/07/21 Today's Date: 03/12/2022   History of present illness Patient is a 77 year old female who presented with a week history of nausea and vomiting with diarrhea. Patient was admitted with influenza A, dehydration, contact dermatitis. PMH: CKD II, DM, CVA 2016, CABG, L PCA stroke 2017, COVID 19, RBBB, autoimmune deficiency syndrome.   OT comments  Patient was noted to have made progress towards short term goals. Patient was able to complete toileting and grooming [facility] with supervision with cues for safety. Patient plans to d/c home with significant other to assist as needed. Patient was educated on ECT when transitioning home and importance of getting out of bed multiple times a day. Patient verbalized understanding. Patient's discharge plan remains appropriate at this time. OT will continue to follow acutely.     Recommendations for follow up therapy are one component of a multi-disciplinary discharge planning process, led by the attending physician.  Recommendations may be updated based on patient status, additional functional criteria and insurance authorization.    Follow Up Recommendations  Home health OT     Assistance Recommended at Discharge Frequent or constant Supervision/Assistance  Patient can return home with the following  A little help with walking and/or transfers;A lot of help with bathing/dressing/bathroom;Assistance with cooking/housework;Help with stairs or ramp for entrance;Assist for transportation   Equipment Recommendations  BSC/3in1    Recommendations for Other Services      Precautions / Restrictions Precautions Precautions: Fall Precaution Comments: incontinent (bladder, bowel); dizziness per OT earlier today Restrictions Weight Bearing Restrictions: No       Mobility Bed Mobility Overal bed mobility: Needs Assistance Bed Mobility: Supine to Sit,  Sit to Supine     Supine to sit: Supervision Sit to supine: Supervision           Balance Overall balance assessment: Mild deficits observed, not formally tested                     ADL either performed or assessed with clinical judgement   ADL Overall ADL's : Needs assistance/impaired     Grooming: Set up;Standing;Wash/dry face;Wash/dry hands;Oral care Grooming Details (indicate cue type and reason): at sink             Lower Body Dressing: Set up;Sitting/lateral leans Lower Body Dressing Details (indicate cue type and reason): with increased time. Toilet Transfer: Supervision/safety;Ambulation;Rolling walker (2 wheels)   Toileting- Clothing Manipulation and Hygiene: Supervision/safety;Sit to/from stand                Cognition Arousal/Alertness: Awake/alert Behavior During Therapy: WFL for tasks assessed/performed Overall Cognitive Status: Within Functional Limits for tasks assessed             General Comments: cooperative this session                   Pertinent Vitals/ Pain       Pain Assessment Pain Assessment: No/denies pain         Frequency  Min 2X/week        Progress Toward Goals  OT Goals(current goals can now be found in the care plan section)  Progress towards OT goals: Progressing toward goals     Plan Discharge plan remains appropriate       AM-PAC OT "6 Clicks" Daily Activity     Outcome Measure   Help from another person eating meals?: None Help from  another person taking care of personal grooming?: A Little Help from another person toileting, which includes using toliet, bedpan, or urinal?: A Little Help from another person bathing (including washing, rinsing, drying)?: A Little Help from another person to put on and taking off regular upper body clothing?: A Little Help from another person to put on and taking off regular lower body clothing?: A Little 6 Click Score: 19    End of Session Equipment  Utilized During Treatment: Rolling walker (2 wheels);Gait belt (theraband)  OT Visit Diagnosis: Muscle weakness (generalized) (M62.81);Pain   Activity Tolerance Patient tolerated treatment well   Patient Left in bed;with call bell/phone within reach   Nurse Communication Other (comment) (ok to see)        Time: 1031-5945 OT Time Calculation (min): 23 min  Charges: OT General Charges $OT Visit: 1 Visit OT Treatments $Self Care/Home Management : 23-37 mins  Rennie Plowman, MS Acute Rehabilitation Department Office# [phone]  Willa Rough 03/12/2022, 2:56 PM

## 2022-03-12 NOTE — Plan of Care (Signed)
  Problem: Education: Goal: Knowledge of General Education information will improve Description Including pain rating scale, medication(s)/side effects and non-pharmacologic comfort measures Outcome: Progressing   Problem: Health Behavior/Discharge Planning: Goal: Ability to manage health-related needs will improve Outcome: Progressing   

## 2022-03-12 NOTE — Progress Notes (Signed)
The patient is alert and oriented and has been seen by her physician. The orders for discharge were written. IV has been removed. Went over discharge instructions with patient. Patient is ambulatory. She has been taken down to the discharge lounge with all of her belongings. Patient's spouse and brother have been notified to come pick her up and are aware that she is in the discharge lounge.

## 2022-03-12 NOTE — Plan of Care (Signed)
  Problem: Clinical Measurements: Goal: Ability to maintain clinical measurements within normal limits will improve Outcome: Progressing   Problem: Nutrition: Goal: Adequate nutrition will be maintained Outcome: Progressing   Problem: Elimination: Goal: Will not experience complications related to bowel motility Outcome: Progressing   

## 2022-03-13 ENCOUNTER — Telehealth: Payer: Self-pay | Admitting: *Deleted

## 2022-03-13 NOTE — Patient Outreach (Signed)
  Care Coordination TOC Note Transition Care Management Follow-up Telephone Call Date of discharge and from where: Maria Fox [id number] How have you been since you were released from the hospital? Doing pretty good Any questions or concerns? No  Items Reviewed: Did the pt receive and understand the discharge instructions provided? Yes  Medications obtained and verified? Yes  Other? No  Any new allergies since your discharge? No  Dietary orders reviewed? Yes Carb Modified diet Do you have support at home? Yes   Home Care and Equipment/Supplies: Were home health services ordered? yes If so, what is the name of the agency? Patient declined  Has the agency set up a time to come to the patient's home? not applicable Were any new equipment or medical supplies ordered?  No What is the name of the medical supply agency? n Were you able to get the supplies/equipment? no Do you have any questions related to the use of the equipment or supplies? No  Functional Questionnaire: (I = Independent and D = Dependent) ADLs: I  Bathing/Dressing- I  Meal Prep- I  Eating- I  Maintaining continence- I  Transferring/Ambulation- I  Managing Meds- I  Follow up appointments reviewed:  PCP Hospital f/u appt confirmed? No  Per patient she will call herself and make appointment Specialist Hospital f/u appt confirmed? Yes  Maria Fox [phone] 4:00 Maria Fox [id number] 3:20 Are transportation arrangements needed? No  If their condition worsens, is the pt aware to call PCP or go to the Emergency Dept.? Yes Was the patient provided with contact information for the PCP's office or ED? Yes Was to pt encouraged to call back with questions or concerns? Yes  SDOH assessments and interventions completed:   Yes SDOH Interventions Today    Flowsheet Row Most Recent Value  SDOH Interventions   Food Insecurity Interventions Intervention Not Indicated  Housing Interventions Intervention Not Indicated   Transportation Interventions Intervention Not Indicated       Care Coordination Interventions:  RN instructed patient how to activate her [employer] meal plan post discharge    Encounter Outcome:  Pt. Visit Completed    [facility] [phone]

## 2022-03-14 ENCOUNTER — Other Ambulatory Visit: Payer: Self-pay | Admitting: Family Medicine

## 2022-03-14 NOTE — Discharge Summary (Signed)
Physician Discharge Summary   Patient: Maria Fox MRN: [id number] DOB: 04/02/1945  Admit date:     02/23/2022  Discharge date: 03/12/2022  Discharge Physician: Berle Mull  PCP: [name], MD  Recommendations at discharge:  Follow up with PCP in 1 week  Discharge Diagnoses: Principal Problem:   Dehydration, moderate Active Problems:   Contact dermatitis   Influenza A   Hypertension, essential   Type 2 diabetes mellitus with neurological complications ([name])   Stage 3 chronic kidney disease due to type 2 diabetes mellitus ([name])   Chronic diastolic CHF (congestive heart failure) ([name])   GERD (gastroesophageal reflux disease)   Atrial flutter with rapid ventricular response ([place])   Lobar pneumonia [facility])  Hospital Course: PMH of HTN, CAD SP CABG, CVA, type II DM, HLD, PAD, CKD, NSVT, DVT, neuropathy presented to the hospital with complaints of nausea vomiting diarrhea and influenza infection Assessment and Plan  Influenza A. Possible bacterial pneumonia. Lactic acidosis currently resolved. Completed Tamiflu. Completed antibiotics for pneumonia as well. Continue respiratory supportive care.   Persistent nausea. Initially thought to be secondary to Flagyl which is currently discontinued. Other antibiotic completed as well. History of cholecystectomy. CT head negative. Cortisol level was low but cosyntropin stimulation test is negative. ESR normal. Currently no focal deficit. Now able to tolerate oral diet despite having nausea.  Currently no indication for EGD or GI consultation. Recommend outpatient follow-up with PCP and if has persistent symptoms beyond 2 weeks recommend GI follow-up outpatient.   Headache. From not able to sleep as well as from vision issues without glasses. Given Ambien.  Resume home eyedrops.   Hyperkalemia. Treated with Lokelma.   AKI on CKD 3A. Serum Crea and was stable but worsened again to 1.6.  Required IV fluids.  Now serum  creatinine improving.   E. coli Klebsiella UTI. Treated with ceftriaxone.   Type II DM, uncontrolled with hyperglycemia with nephropathy. Hemoglobin A1c 9.5. Continue sliding scale insulin. Switch Ensure to Glucerna due to hyperglycemia. Resume home glipizide.  Switch to carb modified diet from soft diet. [name] was held due to UTI   Prior history of CVA. History of CAD. Continue Plavix.   Chronic diastolic CHF. Volume status adequate. Monitor.   History of DVT. On Eliquis.  Continue.  Consultants:  none  Procedures performed:  noen  DISCHARGE MEDICATION: Allergies as of 03/12/2022       Reactions   Bee Venom Anaphylaxis, Swelling, Other (See Comments)   Throat swelling   Mushroom Extract Complex Anaphylaxis   Penicillins Anaphylaxis, Hives, Swelling   Tolerates cephalosporins including cephalexin multiple times.  Has patient had a PCN reaction causing immediate rash, facial/tongue/throat swelling, SOB or lightheadedness with hypotension: Yes Has patient had a PCN reaction causing severe rash involving mucus membranes or skin necrosis: Yes Has patient had a PCN reaction that required hospitalization Yes Has patient had a PCN reaction occurring within the last 10 years: No   Codeine Nausea And Vomiting   Sulfa Antibiotics Nausea And Vomiting   Iohexol Itching, Swelling   Erythromycin Base Rash        Medication List     STOP taking these medications    clindamycin 150 MG capsule Commonly known as: CLEOCIN   clindamycin 300 MG capsule Commonly known as: CLEOCIN   doxycycline 100 MG capsule Commonly known as: VIBRAMYCIN   Farxiga 5 MG Tabs tablet Generic drug: dapagliflozin propanediol       TAKE these medications    acetaminophen 500  MG tablet Commonly known as: TYLENOL Take 500-1,000 mg by mouth every 8 (eight) hours as needed for mild pain or headache.   acyclovir ointment 5 % Commonly known as: Zovirax Apply topically 5 (five) times daily.  For 4 days   albuterol 108 (90 Base) MCG/ACT inhaler Commonly known as: ProAir HFA Inhale 2 puffs into the lungs every 6 (six) hours as needed for wheezing or shortness of breath.   Aloe Vera 72 % Crea Apply to burned areas four times daily.   atorvastatin 40 MG tablet Commonly known as: LIPITOR TAKE ONE TABLET BY MOUTH EVERY EVENING What changed: when to take this   benzonatate 100 MG capsule Commonly known as: TESSALON Take 1 capsule (100 mg total) by mouth 3 (three) times daily as needed for cough.   butalbital-acetaminophen-caffeine 50-325-40 MG tablet Commonly known as: FIORICET Take 1 tablet by mouth every 6 (six) hours as needed for headache.   clopidogrel 75 MG tablet Commonly known as: PLAVIX Take 1 tablet (75 mg total) by mouth daily. What changed: when to take this   docusate sodium 100 MG capsule Commonly known as: COLACE Take 1 capsule (100 mg total) by mouth daily. What changed:  when to take this reasons to take this   dorzolamide-timolol 2-0.5 % ophthalmic solution Commonly known as: COSOPT Place 1 drop into both eyes 2 (two) times daily.   DULoxetine 60 MG capsule Commonly known as: CYMBALTA Take 1 capsule (60 mg total) by mouth daily. What changed:  medication strength how much to take when to take this   Eliquis 5 MG Tabs tablet Generic drug: apixaban TAKE ONE TABLET BY MOUTH TWICE DAILY What changed: how much to take   feeding supplement (GLUCERNA SHAKE) Liqd Take 237 mLs by mouth 3 (three) times daily between meals.   furosemide 20 MG tablet Commonly known as: LASIX Take 1 tablet (20 mg total) by mouth daily as needed for fluid or edema (leg swelling).   gabapentin 300 MG capsule Commonly known as: NEURONTIN TAKE ONE CAPSULE BY MOUTH TWICE DAILY   glipiZIDE 10 MG tablet Commonly known as: GLUCOTROL Take 1 tablet (10 mg total) by mouth 2 (two) times daily.   isosorbide mononitrate 30 MG 24 hr tablet Commonly known as: IMDUR TAKE  1/2 TABLET BY MOUTH ONCE DAILY . What changed:  how much to take when to take this additional instructions   latanoprost 0.005 % ophthalmic solution Commonly known as: XALATAN Place 1 drop into both eyes at bedtime.   melatonin 5 MG Tabs Take 1 tablet (5 mg total) by mouth at bedtime.   metFORMIN 500 MG 24 hr tablet Commonly known as: GLUCOPHAGE-XR TAKE TWO TABLETS BY MOUTH EVERY MORNING What changed: when to take this   metoprolol succinate 25 MG 24 hr tablet Commonly known as: TOPROL-XL Take 1.5 tablets (37.5 mg total) by mouth daily. What changed: how much to take   nitroGLYCERIN 0.4 MG SL tablet Commonly known as: NITROSTAT Dissolve 1 tab under tongue as needed for chest pain. May repeat every 5 minutes x 2 doses. If no relief call 9-1-1. What changed: See the new instructions.   ondansetron 4 MG disintegrating tablet Commonly known as: ZOFRAN-ODT Take 1 tablet (4 mg total) by mouth every 8 (eight) hours as needed for nausea or vomiting. What changed:  See the new instructions. Another medication with the same name was removed. Continue taking this medication, and follow the directions you see here.   ONE TOUCH ULTRA 2 w/Device  Kit Use as instructed to check blood sugar 2 times daily as needed.   OneTouch Delica Lancets 07M Misc Use as directed to check blood sugar 2 times daily as needed   OneTouch Ultra test strip Generic drug: glucose blood Use as instructed to check blood sugar 2 times daily as needed   pantoprazole 40 MG tablet Commonly known as: PROTONIX TAKE ONE TABLET BY MOUTH TWICE DAILY What changed: when to take this   polyethylene glycol 17 g packet Commonly known as: MIRALAX / GLYCOLAX Take 17 g by mouth daily as needed for moderate constipation.       Disposition: Home Diet recommendation: Cardiac diet  Discharge Exam: Vitals:   03/12/22 0448 03/12/22 0450 03/12/22 0947 03/12/22 1446  BP: (!) 167/76  (!) 159/85 134/67  Pulse: 75  70 69   Resp: 20   18  Temp: 98.2 F (36.8 C)   97.9 F (36.6 C)  TempSrc:    Oral  SpO2: 97%   97%  Weight:  63.5 kg    Height:       General: Appear in mild distress; no visible Abnormal Neck Mass Or lumps, Conjunctiva normal Cardiovascular: S1 and S2 Present, no Murmur, Respiratory: good respiratory effort, Bilateral Air entry present and CTA, no Crackles, no wheezes Abdomen: Bowel Sound present, Non tender  Extremities: no Pedal edema Neurology: alert and oriented to time, place, and person  Filed Weights   03/06/22 0500 03/10/22 0500 03/12/22 0450  Weight: 47 kg 63.2 kg 63.5 kg   Condition at discharge: stable  The results of significant diagnostics from this hospitalization (including imaging, microbiology, ancillary and laboratory) are listed below for reference.   Imaging Studies: CT HEAD WO CONTRAST (5MM)  Result Date: 03/08/2022 CLINICAL DATA:  77 year old female with headache.  Migraine. EXAM: CT HEAD WITHOUT CONTRAST TECHNIQUE: Contiguous axial images were obtained from the base of the skull through the vertex without intravenous contrast. RADIATION DOSE REDUCTION: This exam was performed according to the departmental dose-optimization program which includes automated exposure control, adjustment of the mA and/or kV according to patient size and/or use of iterative reconstruction technique. COMPARISON:  Brain MRI 07/18/2019.  Head CT 07/16/2019. FINDINGS: Brain: Chronic left PCA territory infarct with encephalomalacia is stable from 2 years ago. No midline shift, ventriculomegaly, mass effect, evidence of mass lesion, intracranial hemorrhage or evidence of cortically based acute infarction. Asymmetric Patchy and confluent cerebral white matter hypodensity appears chronic but increased in the bilateral corona radiata greater on the left. Small chronic thalamic lacunar infarcts were better demonstrated by MRI. Vascular: Extensive Calcified atherosclerosis at the skull base. No  suspicious intracranial vascular hyperdensity. Chronic basilar artery tortuosity. Skull: No acute osseous abnormality identified. Sinuses/Orbits: Visualized paranasal sinuses and mastoids are stable and well aerated. Tympanic cavities are clear. Other: No acute orbit or scalp soft tissue finding. IMPRESSION: 1. No acute intracranial abnormality identified. 2. Chronic Left PCA territory infarct, and progression of cerebral white matter small vessel disease since 2021. Electronically Signed   By: Genevie Ann M.D.   On: 03/08/2022 11:46   DG Chest 2 View  Result Date: 03/01/2022 CLINICAL DATA:  Pneumonia. EXAM: CHEST - 2 VIEW COMPARISON:  February 23, 2022. FINDINGS: The heart size and mediastinal contours are within normal limits. Sternotomy wires are noted. Right lung is clear. Mild left basilar subsegmental atelectasis or infiltrate is noted with probable small left pleural effusion. The visualized skeletal structures are unremarkable. IMPRESSION: Left basilar opacity as described above. Electronically Signed  By: Marijo Conception M.D.   On: 03/01/2022 11:48   ECHOCARDIOGRAM COMPLETE  Result Date: 02/25/2022    ECHOCARDIOGRAM REPORT   Patient Name:   LEAR CARSTENS Date of Exam: 02/25/2022 Medical Rec #:  [id number]    Height:       62.0 in Accession #:    [id number]   Weight:       136.9 lb Date of Birth:  [birth year]    BSA:          1.627 m Patient Age:    57 years     BP:           129/70 mmHg Patient Gender: F            HR:           73 bpm. Exam Location:  Inpatient Procedure: 2D Echo, Cardiac Doppler and Color Doppler Indications:    CHF  History:        Patient has prior history of Echocardiogram examinations, most                 recent 07/24/2020. CHF, CAD, Prior CABG, Stroke and DVT, TR/MR,                 Arrythmias:NSVT, Atrial Fibrillation and Atrial Flutter,                 Signs/Symptoms:Hypotension; Risk Factors:Hypertension,                 Dyslipidemia and Diabetes. AIDS, angioedema.   Sonographer:    Eartha Inch Referring Phys: [phone] STEPHEN K CHIU  Sonographer Comments: Image acquisition challenging due to patient body habitus and Image acquisition challenging due to respiratory motion. IMPRESSIONS  1. Left ventricular ejection fraction, by estimation, is 60 to 65%. The left ventricle has normal function. The left ventricle has no regional wall motion abnormalities. Indeterminate diastolic filling due to E-A fusion.  2. Right ventricular systolic function is normal. The right ventricular size is normal. There is normal pulmonary artery systolic pressure. The estimated right ventricular systolic pressure is 74.1 mmHg.  3. The mitral valve is grossly normal. Mild to moderate mitral valve regurgitation. No evidence of mitral stenosis.  4. The aortic valve is tricuspid. Aortic valve regurgitation is not visualized. No aortic stenosis is present.  5. The inferior vena cava is dilated in size with >50% respiratory variability, suggesting right atrial pressure of 8 mmHg. Comparison(s): No significant change from prior study. FINDINGS  Left Ventricle: Left ventricular ejection fraction, by estimation, is 60 to 65%. The left ventricle has normal function. The left ventricle has no regional wall motion abnormalities. The left ventricular internal cavity size was normal in size. There is  no left ventricular hypertrophy. Abnormal (paradoxical) septal motion consistent with post-operative status. Left ventricular diastolic function could not be evaluated due to atrial fibrillation. Indeterminate diastolic filling due to E-A fusion. Right Ventricle: The right ventricular size is normal. No increase in right ventricular wall thickness. Right ventricular systolic function is normal. There is normal pulmonary artery systolic pressure. The tricuspid regurgitant velocity is 1.91 m/s, and  with an assumed right atrial pressure of 8 mmHg, the estimated right ventricular systolic pressure is 28.7 mmHg. Left  Atrium: Left atrial size was normal in size. Right Atrium: Right atrial size was normal in size. Pericardium: There is no evidence of pericardial effusion. Mitral Valve: The mitral valve is grossly normal. Mild to moderate mitral valve regurgitation. No evidence of mitral  valve stenosis. Tricuspid Valve: The tricuspid valve is grossly normal. Tricuspid valve regurgitation is mild . No evidence of tricuspid stenosis. Aortic Valve: The aortic valve is tricuspid. Aortic valve regurgitation is not visualized. No aortic stenosis is present. Pulmonic Valve: The pulmonic valve was grossly normal. Pulmonic valve regurgitation is not visualized. No evidence of pulmonic stenosis. Aorta: The aortic root and ascending aorta are structurally normal, with no evidence of dilitation. Venous: The inferior vena cava is dilated in size with greater than 50% respiratory variability, suggesting right atrial pressure of 8 mmHg. IAS/Shunts: The atrial septum is grossly normal.  LEFT VENTRICLE PLAX 2D LVIDd:         3.80 cm     Diastology LVIDs:         2.80 cm     LV e' medial:    4.24 cm/s LV PW:         1.20 cm     LV E/e' medial:  30.4 LV IVS:        1.00 cm     LV e' lateral:   3.92 cm/s LVOT diam:     2.10 cm     LV E/e' lateral: 32.9 LV SV:         70 LV SV Index:   43 LVOT Area:     3.46 cm  LV Volumes (MOD) LV vol d, MOD A2C: 58.9 ml LV vol d, MOD A4C: 77.0 ml LV vol s, MOD A2C: 22.9 ml LV vol s, MOD A4C: 28.6 ml LV SV MOD A2C:     36.0 ml LV SV MOD A4C:     77.0 ml LV SV MOD BP:      42.4 ml RIGHT VENTRICLE            IVC RV S prime:     9.14 cm/s  IVC diam: 2.10 cm TAPSE (M-mode): 1.7 cm LEFT ATRIUM             Index        RIGHT ATRIUM           Index LA diam:        4.40 cm 2.70 cm/m   RA Area:     13.10 cm LA Vol (A2C):   69.0 ml 42.37 ml/m  RA Volume:   26.00 ml  15.98 ml/m LA Vol (A4C):   46.5 ml 28.58 ml/m LA Biplane Vol: 53.9 ml 33.12 ml/m  AORTIC VALVE LVOT Vmax:   95.00 cm/s LVOT Vmean:  66.400 cm/s LVOT VTI:     0.203 m  AORTA Ao Root diam: 3.30 cm Ao Asc diam:  3.30 cm MITRAL VALVE                TRICUSPID VALVE MV Area (PHT): 3.42 cm     TR Peak grad:   14.6 mmHg MV Decel Time: 222 msec     TR Vmax:        191.00 cm/s MR Peak grad: 104.9 mmHg MR Mean grad: 70.0 mmHg     SHUNTS MR Vmax:      512.00 cm/s   Systemic VTI:  0.20 m MR Vmean:     401.0 cm/s    Systemic Diam: 2.10 cm MV E velocity: 129.00 cm/s Eleonore Chiquito MD Electronically signed by Eleonore Chiquito MD Signature Date/Time: 02/25/2022/4:40:52 PM    Final    CT ABDOMEN PELVIS WO CONTRAST  Result Date: 02/23/2022 CLINICAL DATA:  Abdominal pain.  Diarrhea. EXAM: CT ABDOMEN AND PELVIS  WITHOUT CONTRAST TECHNIQUE: Multidetector CT imaging of the abdomen and pelvis was performed following the standard protocol without IV contrast. RADIATION DOSE REDUCTION: This exam was performed according to the departmental dose-optimization program which includes automated exposure control, adjustment of the mA and/or kV according to patient size and/or use of iterative reconstruction technique. COMPARISON:  Jul 30, 2020 CT the abdomen and pelvis FINDINGS: Lower chest: Mild opacity in the anterior left base on series 6, image 1 not fully assessed on this film. No abnormalities were identified on the chest x-ray from today the lung bases are otherwise unremarkable. The lower chest is otherwise unremarkable. Hepatobiliary: The liver dome was not completely included on this study. Within visualized limits liver is unremarkable. The patient is status post cholecystectomy. Pancreas: Unremarkable. No pancreatic ductal dilatation or surrounding inflammatory changes. Spleen: Calcified granulomata are identified in a normal-sized spleen. Adrenals/Urinary Tract: Adrenal glands are unremarkable. Kidneys are normal, without renal calculi, focal lesion, or hydronephrosis. Bladder is unremarkable. Stomach/Bowel: The stomach is normal in appearance. Evaluation of the small bowel is limited due  the lack of oral contrast. No small bowel obstruction or abnormality identified. Scattered colonic diverticuli are identified without diverticulitis. The colon is otherwise unremarkable. The appendix is normal. Vascular/Lymphatic: Calcified atherosclerotic changes are identified in the nonaneurysmal aorta and iliac vessels. No adenopathy. Reproductive: Uterus and bilateral adnexa are unremarkable. Other: No abdominal wall hernia or abnormality. No abdominopelvic ascites. Musculoskeletal: No acute or significant osseous findings. IMPRESSION: 1. No cause for the patient's symptoms identified. 2. Mild opacity in the anterior left lung base is not fully assessed on this study. No infiltrate was identified on today's chest x-ray and atelectasis or scarring is a possibility. 3. Calcified atherosclerotic changes in the nonaneurysmal aorta and iliac vessels. 4. Scattered colonic diverticuli without diverticulitis. 5. Aortic atherosclerosis. Aortic Atherosclerosis (ICD10-I70.0). Electronically Signed   By: Dorise Bullion III M.D.   On: 02/23/2022 17:34   DG Chest Port 1 View  Result Date: 02/23/2022 CLINICAL DATA:  Nausea, vomiting, and diarrhea for about 1 week. EXAM: PORTABLE CHEST 1 VIEW COMPARISON:  Chest x-ray July 01, 2020 FINDINGS: The cardiomediastinal silhouette is unchanged in contour status post median sternotomy. Loop recorder in place. No focal pulmonary opacity. No pleural effusion or pneumothorax. The visualized upper abdomen is unremarkable. No acute osseous abnormality. IMPRESSION: No acute cardiopulmonary abnormality. Electronically Signed   By: Beryle Flock M.D.   On: 02/23/2022 16:17    Microbiology: Results for orders placed or performed during the hospital encounter of 02/23/22  Resp panel by RT-PCR (RSV, Flu A&B, Covid) Anterior Nasal Swab     Status: Abnormal   Collection Time: 02/23/22  7:02 PM   Specimen: Anterior Nasal Swab  Result Value Ref Range Status   SARS Coronavirus 2 by  RT PCR NEGATIVE NEGATIVE Final    Comment: (NOTE) SARS-CoV-2 target nucleic acids are NOT DETECTED.  The SARS-CoV-2 RNA is generally detectable in upper respiratory specimens during the acute phase of infection. The lowest concentration of SARS-CoV-2 viral copies this assay can detect is 138 copies/mL. A negative result does not preclude SARS-Cov-2 infection and should not be used as the sole basis for treatment or other patient management decisions. A negative result may occur with  improper specimen collection/handling, submission of specimen other than nasopharyngeal swab, presence of viral mutation(s) within the areas targeted by this assay, and inadequate number of viral copies(<138 copies/mL). A negative result must be combined with clinical observations, patient history, and epidemiological information. The  expected result is Negative.  Fact Sheet for Patients:  EntrepreneurPulse.com.au  Fact Sheet for Healthcare Providers:  IncredibleEmployment.be  This test is no t yet approved or cleared by the [place] FDA and  has been authorized for detection and/or diagnosis of SARS-CoV-2 by FDA under an Emergency Use Authorization (EUA). This EUA will remain  in effect (meaning this test can be used) for the duration of the COVID-19 declaration under Section 564(b)(1) of the Act, 21 U.S.C.section 360bbb-3(b)(1), unless the authorization is terminated  or revoked sooner.       Influenza A by PCR POSITIVE (A) NEGATIVE Final   Influenza B by PCR NEGATIVE NEGATIVE Final    Comment: (NOTE) The Xpert Xpress SARS-CoV-2/FLU/RSV plus assay is intended as an aid in the diagnosis of influenza from Nasopharyngeal swab specimens and should not be used as a sole basis for treatment. Nasal washings and aspirates are unacceptable for Xpert Xpress SARS-CoV-2/FLU/RSV testing.  Fact Sheet for Patients: EntrepreneurPulse.com.au  Fact  Sheet for Healthcare Providers: IncredibleEmployment.be  This test is not yet approved or cleared by the [place] FDA and has been authorized for detection and/or diagnosis of SARS-CoV-2 by FDA under an Emergency Use Authorization (EUA). This EUA will remain in effect (meaning this test can be used) for the duration of the COVID-19 declaration under Section 564(b)(1) of the Act, 21 U.S.C. section 360bbb-3(b)(1), unless the authorization is terminated or revoked.     Resp Syncytial Virus by PCR NEGATIVE NEGATIVE Final    Comment: (NOTE) Fact Sheet for Patients: EntrepreneurPulse.com.au  Fact Sheet for Healthcare Providers: IncredibleEmployment.be  This test is not yet approved or cleared by the [place] FDA and has been authorized for detection and/or diagnosis of SARS-CoV-2 by FDA under an Emergency Use Authorization (EUA). This EUA will remain in effect (meaning this test can be used) for the duration of the COVID-19 declaration under Section 564(b)(1) of the Act, 21 U.S.C. section 360bbb-3(b)(1), unless the authorization is terminated or revoked.  Performed at [facility], [place] [address] [place], [place] [address]   C Difficile Quick Screen w PCR reflex     Status: None   Collection Time: 02/24/22 10:52 AM   Specimen: Stool  Result Value Ref Range Status   C Diff antigen NEGATIVE NEGATIVE Final   C Diff toxin NEGATIVE NEGATIVE Final   C Diff interpretation No C. difficile detected.  Final    Comment: Performed at [facility], [place] [address] [place], [place] [address]  Gastrointestinal Panel by PCR , Stool     Status: None   Collection Time: 02/25/22 10:45 AM   Specimen: Stool  Result Value Ref Range Status   Campylobacter species NOT DETECTED NOT DETECTED Final   Plesimonas shigelloides NOT DETECTED NOT DETECTED Final   Salmonella species NOT DETECTED NOT DETECTED  Final   Yersinia enterocolitica NOT DETECTED NOT DETECTED Final   Vibrio species NOT DETECTED NOT DETECTED Final   Vibrio cholerae NOT DETECTED NOT DETECTED Final   Enteroaggregative E coli (EAEC) NOT DETECTED NOT DETECTED Final   Enteropathogenic E coli (EPEC) NOT DETECTED NOT DETECTED Final   Enterotoxigenic E coli (ETEC) NOT DETECTED NOT DETECTED Final   Shiga like toxin producing E coli (STEC) NOT DETECTED NOT DETECTED Final   Shigella/Enteroinvasive E coli (EIEC) NOT DETECTED NOT DETECTED Final   Cryptosporidium NOT DETECTED NOT DETECTED Final   Cyclospora cayetanensis NOT DETECTED NOT DETECTED Final   Entamoeba histolytica NOT DETECTED NOT DETECTED Final  Giardia lamblia NOT DETECTED NOT DETECTED Final   Adenovirus F40/41 NOT DETECTED NOT DETECTED Final   Astrovirus NOT DETECTED NOT DETECTED Final   Norovirus GI/GII NOT DETECTED NOT DETECTED Final   Rotavirus A NOT DETECTED NOT DETECTED Final   Sapovirus (I, II, IV, and V) NOT DETECTED NOT DETECTED Final    Comment: Performed at [facility], [address], [place], [place] [address]  Urine Culture     Status: Abnormal   Collection Time: 02/26/22 12:07 PM   Specimen: Urine, Clean Catch  Result Value Ref Range Status   Specimen Description   Final    URINE, CLEAN CATCH Performed at [facility] [address] [place], [place] [address]    Special Requests   Final    NONE Performed at [facility], [place] [address], [place], [place] [address]    Culture (A)  Final    >=100,000 COLONIES/mL KLEBSIELLA PNEUMONIAE 60,000 COLONIES/mL ESCHERICHIA COLI    Report Status 02/28/2022 FINAL  Final   Organism ID, Bacteria KLEBSIELLA PNEUMONIAE (A)  Final   Organism ID, Bacteria ESCHERICHIA COLI (A)  Final      Susceptibility   Escherichia coli - MIC*    AMPICILLIN 16 INTERMEDIATE Intermediate     CEFAZOLIN <=4 SENSITIVE Sensitive     CEFEPIME <=0.12 SENSITIVE Sensitive      CEFTRIAXONE <=0.25 SENSITIVE Sensitive     CIPROFLOXACIN <=0.25 SENSITIVE Sensitive     GENTAMICIN <=1 SENSITIVE Sensitive     IMIPENEM <=0.25 SENSITIVE Sensitive     NITROFURANTOIN <=16 SENSITIVE Sensitive     TRIMETH/SULFA <=20 SENSITIVE Sensitive     AMPICILLIN/SULBACTAM 4 SENSITIVE Sensitive     PIP/TAZO <=4 SENSITIVE Sensitive     * 60,000 COLONIES/mL ESCHERICHIA COLI   Klebsiella pneumoniae - MIC*    AMPICILLIN >=32 RESISTANT Resistant     CEFAZOLIN <=4 SENSITIVE Sensitive     CEFEPIME <=0.12 SENSITIVE Sensitive     CEFTRIAXONE <=0.25 SENSITIVE Sensitive     CIPROFLOXACIN <=0.25 SENSITIVE Sensitive     GENTAMICIN <=1 SENSITIVE Sensitive     IMIPENEM 0.5 SENSITIVE Sensitive     NITROFURANTOIN 64 INTERMEDIATE Intermediate     TRIMETH/SULFA <=20 SENSITIVE Sensitive     AMPICILLIN/SULBACTAM 4 SENSITIVE Sensitive     PIP/TAZO <=4 SENSITIVE Sensitive     * >=100,000 COLONIES/mL KLEBSIELLA PNEUMONIAE   Labs: CBC: Recent Labs  Lab 03/08/22 0349 03/11/22 1033 03/12/22 0319  WBC 5.8 5.5 5.4  NEUTROABS  --  3.2 2.5  HGB 10.2* 10.7* 10.8*  HCT 31.6* 33.1* 33.4*  MCV 93.5 91.9 91.3  PLT 192 191 277   Basic Metabolic Panel: Recent Labs  Lab 03/08/22 0349 03/09/22 0340 03/10/22 0840 03/11/22 1033 03/12/22 0319  NA 136 138 136 134* 134*  K 3.9 5.1 3.9 3.7 3.7  CL 108 107 102 100 101  CO2 _0 GLUCOSE 196* 172* 206* 305* 117*  BUN _1 30*  CREATININE 1.29* 1.27* 1.63* 1.22* 1.24*  CALCIUM 8.1* 8.7* 8.7* 8.5* 8.6*  MG  --   --   --  1.7 1.7   Liver Function Tests: Recent Labs  Lab 03/08/22 0349 03/11/22 1033 03/12/22 0319  AST 30 38 44*  ALT _2 ALKPHOS 65 66 78  BILITOT 0.5 0.4 0.6  PROT 4.8* 5.9* 6.2*  ALBUMIN 2.1* 3.1* 3.0*   CBG: Recent Labs  Lab 03/11/22 1131 03/11/22 1702 03/11/22 2034  03/12/22 0800 03/12/22 1117  GLUCAP 259* 242* 91 113* 171*    Discharge time spent: greater than 30 minutes.  Signed: Berle Mull,  MD [facility] 03/12/2022

## 2022-03-15 ENCOUNTER — Telehealth: Payer: Self-pay

## 2022-03-15 NOTE — Progress Notes (Unsigned)
Care Management & Coordination Services Pharmacy Team  Reason for Encounter: Medication coordination and delivery  Contacted patient on 03/15/2022 to discuss medications   Recent office visits:  None since last CCM contact  Recent consult visits:  None since last CCM contact  Hospital visits:  Medication Reconciliation was completed by comparing discharge summary, patient's EMR and Pharmacy list, and upon discussion with patient.  Admitted to the hospital on 02/23/22 due to dehydration . Discharge date was 03/12/2022. Discharged from [place]?Medications Started at [facility] Discharge:?? -started Benzonatate  Fioricet  melatonin  Medication Changes at Hospital Discharge: -Changed duloxetine  Metoprolol succinate  Ondansetron  Medications Discontinued at Hospital Discharge: -Stopped clindamycin  Doxycycline  Farxiga   Medications that remain the same after Hospital Discharge:??  -All other medications will remain the same.    Medications: Outpatient Encounter Medications as of 03/15/2022  Medication Sig Note   acetaminophen (TYLENOL) 500 MG tablet Take 500-1,000 mg by mouth every 8 (eight) hours as needed for mild pain or headache.    acyclovir ointment (ZOVIRAX) 5 % Apply topically 5 (five) times daily. For 4 days 02/23/2022: No definite answer received   albuterol (PROAIR HFA) 108 (90 Base) MCG/ACT inhaler Inhale 2 puffs into the lungs every 6 (six) hours as needed for wheezing or shortness of breath.    Aloe Vera 72 % CREA Apply to burned areas four times daily. (Patient not taking: Reported on 02/23/2022)    atorvastatin (LIPITOR) 40 MG tablet TAKE ONE TABLET BY MOUTH EVERY EVENING (Patient taking differently: Take 40 mg by mouth daily after supper.)    benzonatate (TESSALON) 100 MG capsule Take 1 capsule (100 mg total) by mouth 3 (three) times daily as needed for cough.    Blood Glucose Monitoring Suppl (ONE TOUCH ULTRA 2) w/Device KIT Use as instructed  to check blood sugar 2 times daily as needed.    butalbital-acetaminophen-caffeine (FIORICET) 50-325-40 MG tablet Take 1 tablet by mouth every 6 (six) hours as needed for headache.    clopidogrel (PLAVIX) 75 MG tablet Take 1 tablet (75 mg total) by mouth daily. (Patient taking differently: Take 75 mg by mouth in the morning.)    docusate sodium (COLACE) 100 MG capsule Take 1 capsule (100 mg total) by mouth daily. (Patient taking differently: Take 100 mg by mouth daily as needed for mild constipation.)    dorzolamide-timolol (COSOPT) 2-0.5 % ophthalmic solution Place 1 drop into both eyes 2 (two) times daily.    DULoxetine (CYMBALTA) 60 MG capsule Take 1 capsule (60 mg total) by mouth daily.    ELIQUIS 5 MG TABS tablet TAKE ONE TABLET BY MOUTH TWICE DAILY (Patient taking differently: Take 5 mg by mouth 2 (two) times daily.)    feeding supplement, GLUCERNA SHAKE, (GLUCERNA SHAKE) LIQD Take 237 mLs by mouth 3 (three) times daily between meals. (Patient not taking: Reported on 02/23/2022)    furosemide (LASIX) 20 MG tablet Take 1 tablet (20 mg total) by mouth daily as needed for fluid or edema (leg swelling). (Patient not taking: Reported on 02/23/2022)    gabapentin (NEURONTIN) 300 MG capsule TAKE ONE CAPSULE BY MOUTH TWICE DAILY (Patient taking differently: Take 300 mg by mouth 2 (two) times daily.)    glipiZIDE (GLUCOTROL) 10 MG tablet Take 1 tablet (10 mg total) by mouth 2 (two) times daily.    glucose blood (ONETOUCH ULTRA) test strip Use as instructed to check blood sugar 2 times daily as needed  isosorbide mononitrate (IMDUR) 30 MG 24 hr tablet TAKE 1/2 TABLET BY MOUTH ONCE DAILY . (Patient taking differently: 15 mg daily with breakfast.)    latanoprost (XALATAN) 0.005 % ophthalmic solution Place 1 drop into both eyes at bedtime.     melatonin 5 MG TABS Take 1 tablet (5 mg total) by mouth at bedtime.    metFORMIN (GLUCOPHAGE-XR) 500 MG 24 hr tablet TAKE TWO TABLETS BY MOUTH EVERY MORNING (Patient  taking differently: Take 1,000 mg by mouth daily with breakfast.)    metoprolol succinate (TOPROL-XL) 25 MG 24 hr tablet Take 1.5 tablets (37.5 mg total) by mouth daily.    nitroGLYCERIN (NITROSTAT) 0.4 MG SL tablet Dissolve 1 tab under tongue as needed for chest pain. May repeat every 5 minutes x 2 doses. If no relief call 9-1-1. (Patient taking differently: Place 0.4 mg under the tongue every 5 (five) minutes x 2 doses as needed for chest pain (CALL 9-1-1, if no relief).)    ondansetron (ZOFRAN-ODT) 4 MG disintegrating tablet Take 1 tablet (4 mg total) by mouth every 8 (eight) hours as needed for nausea or vomiting.    OneTouch Delica Lancets 19T MISC Use as directed to check blood sugar 2 times daily as needed    pantoprazole (PROTONIX) 40 MG tablet TAKE ONE TABLET BY MOUTH TWICE DAILY (Patient taking differently: Take 40 mg by mouth 2 (two) times daily before a meal.)    polyethylene glycol (MIRALAX / GLYCOLAX) 17 g packet Take 17 g by mouth daily as needed for moderate constipation.    No facility-administered encounter medications on file as of 03/15/2022.   BP Readings from Last 3 Encounters:  03/12/22 134/67  01/04/22 (!) 136/95  11/06/21 128/84    Pulse Readings from Last 3 Encounters:  03/12/22 69  01/04/22 (!) 106  11/06/21 88    Lab Results  Component Value Date/Time   HGBA1C 9.2 (H) 02/25/2022 03:55 AM   HGBA1C 9.5 (H) 10/30/2021 02:52 PM   Lab Results  Component Value Date   CREATININE 1.24 (H) 03/12/2022   BUN 30 (H) 03/12/2022   GFR 32.12 (L) 10/30/2021   GFRNONAA 45 (L) 03/12/2022   GFRAA 41 (L) 01/28/2020   NA 134 (L) 03/12/2022   K 3.7 03/12/2022   CALCIUM 8.6 (L) 03/12/2022   CO2 25 03/12/2022     Last adherence delivery date:02/26/22      Patient is due for next adherence delivery on: 03/27/2022  Spoke with patient on 03/18/2022 reviewed medications and coordinated delivery.  This delivery to include: Adherence Packaging  30 Days  Farxiga 5 mg - 1 tablet  daily (1 breakfast)  Pantoprazole 40 mg - 1 tablet twice daily (1 breakfast, 1 evening meal) Gabapentin 300 mg- 1 tablet twice daily (1 breakfast, 1 evening meal) Duloxetine 30 mg - 1 capsule daily (1 breakfast) Atorvastatin 40 mg - 1 tablet daily (1 evening meal) Isosorbide Mononitrate 30 mg - 1/2 tablet daily (1/2 breakfast) Glipizide 10 mg - 1 tablet twice daily (1 breakfast, 1 evening meal) Metformin 500 mg ER - 2 tablets ( breakfast) Eliquis 5 mg- 1 tablet twice daily (1 breakfast, 1 evening meal) Metoprolol succinate 25 mg ER - 1 tablet daily  (breakfast) Clopidogrel '75mg'$  - Take 1 tablet daily ( breakfast)  VIAL medications: Latanoprost 0.005 % eye drops - 1 drop in each eye daily Ondansetron '4mg'$ - take 1 tablet every 8 hours   Patient declined the following medications this month: none  Refills requested from providers  include: Eliquis, farxiga  Confirmed delivery date of 03/27/2022, advised patient that pharmacy will contact them the morning of delivery.   Any concerns about your medications? No   Patient states   How often do you forget or accidentally miss a dose? Rarely possible during hospital stay   Do you use a pillbox? No  Is patient in packaging Yes  What is the date on your next pill pack? Not at home to read the pack date .  Any concerns or issues with your packaging? Satisfied    Recent blood pressure readings are as follows:none available   Recent blood glucose readings are as follows: none available   Cycle dispensing form sent to [facility],   for review.  Patient reports no changes in her medications  and she went over with nurse. I explained to her to make a follow up with Dr. Farrel Gobble, CPP notified  Avel Sensor, [place]  [phone]    Avel Sensor, CMA

## 2022-03-19 MED ORDER — DAPAGLIFLOZIN PROPANEDIOL 5 MG PO TABS: 5.0000 mg | ORAL_TABLET | Freq: Every day | ORAL | 9 refills | Status: DC

## 2022-03-19 NOTE — Telephone Encounter (Signed)
Agree with restarting farxiga '5mg'$  daily - sent to pharmacy.  Lab Results  Component Value Date   HGBA1C 9.2 (H) 02/25/2022    Lab Results  Component Value Date   CREATININE 1.24 (H) 03/12/2022   BUN 30 (H) 03/12/2022   NA 134 (L) 03/12/2022   K 3.7 03/12/2022   CL 101 03/12/2022   CO2 25 03/12/2022   Will need to reassess if recurrent UTI.

## 2022-03-19 NOTE — Telephone Encounter (Signed)
Patient has been scheduled

## 2022-03-19 NOTE — Telephone Encounter (Signed)
Patient needs to schedule PCP appt for hospital f/u appt. Please help coordinate scheduling.

## 2022-03-19 NOTE — Telephone Encounter (Signed)
LVM for patient to call back and schedule

## 2022-03-19 NOTE — Telephone Encounter (Signed)
Per chart review, Maria Fox was held while pt was in the hospital for UTI. It was not restarted at discharge. Pt has requested to refill Maria Fox now. Given history of uncontrolled DM and resolution of UTI, it would be reasonable to restart [place]. Would recommend f/u appt with PCP.  Routing to PCP for input.

## 2022-03-19 NOTE — Addendum Note (Signed)
Addended by: [name] on: 03/19/2022 01:51 PM   Modules accepted: Orders

## 2022-03-19 NOTE — Telephone Encounter (Signed)
Cardiology has made changes to pt's meds.

## 2022-03-21 ENCOUNTER — Telehealth: Payer: Self-pay

## 2022-03-21 NOTE — Telephone Encounter (Addendum)
Message from Dr. [name]: Pt has missed several recent visits. Recently hosptialized with influenza and bronchitis. Plz call and schedule sooner f/u.  Thanks  Maria Fox   Pt currently scheduled for hosp f/u on Fri, 03/29/22.  Lvm asking pt to call back. Need to r/s follow up sooner than 1/19.

## 2022-03-22 NOTE — Telephone Encounter (Addendum)
Received message from [facility], recent refill request for Eliquis was denied and they will not be able to add it to pill pack delivery 03/27/22. Pt has been advised to contact cardiology regarding Eliquis Rx as prescription may change.  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 Aflutter also occurred in acute setting of DVT/PE, unclear if this is a chronic issue. Unclear if Eliquis is still needed.   Pt has scheduled her overdue follow up with Dr Radford Pax (04/05/22).  Routing to cardiology for input.

## 2022-03-22 NOTE — Telephone Encounter (Signed)
Message from Dr. [name]: Pt has missed several recent visits. Recently hosptialized with influenza and bronchitis. Plz call and schedule sooner f/u.  Thanks  Maria Fox   Pt currently scheduled for hosp f/u on Fri, 03/29/22.  Lvm asking pt/pt's husband, Maria Fox (on dpr) to call back. Need to r/s follow up sooner than 1/19.

## 2022-03-25 NOTE — Telephone Encounter (Signed)
Message from Dr. [name]: Pt has missed several recent visits. Recently hosptialized with influenza and bronchitis. Plz call and schedule sooner f/u.  Thanks  [name]   Pt currently scheduled for hosp f/u on Fri, 03/29/22.  Lvm asking pt/pt's husband, [name] (on dpr) to call back. Need to r/s follow up sooner than 1/19.

## 2022-03-26 NOTE — Telephone Encounter (Signed)
Message from Dr. [name]: Pt has missed several recent visits. Recently hosptialized with influenza and bronchitis. Plz call and schedule sooner f/u.  Thanks  Maria Fox   Pt currently scheduled for hosp f/u on Fri, 03/29/22.  Lvm asking pt/pt's husband, [name] (on dpr) to call back. Need to r/s follow up sooner than 1/19.

## 2022-03-27 NOTE — Telephone Encounter (Signed)
Attempted several times to contact pt with no response. Pt will see Dr. [name] on 03/29/22.

## 2022-03-28 NOTE — Telephone Encounter (Addendum)
Pt rtn call. States she needs to r/s 4/80/16 f/u due to conflict with her rides appt.  Pt r/s hosp f/u on 04/02/22 at 3:00. Fyi to Dr. [name].

## 2022-03-29 ENCOUNTER — Telehealth: Payer: Self-pay

## 2022-03-29 ENCOUNTER — Other Ambulatory Visit: Payer: Self-pay | Admitting: Family Medicine

## 2022-03-29 ENCOUNTER — Inpatient Hospital Stay: Payer: 59 | Admitting: Family Medicine

## 2022-03-29 NOTE — Telephone Encounter (Signed)
Tried to call Erica back at [facility] but unable to reach her because an extension  is required. No extension was listed in the call back telephone number. Okay to refill Eliquis 12/14/21 #[id number] Last office visit 11/06/21 Upcoming appointment 04/02/22

## 2022-03-29 NOTE — Telephone Encounter (Signed)
Received notification that patient needs refill of eliquis as she ran out 03/27/22. (Spoke to patient to confirm this.) It appears PCP has been refilling this for her, we have not seen her since 01/2020. PCP asked her to follow up with [place] but she ran out before she could be seen. Called PCP and left message with Dr. Bosie Clos staff to see if he will refill since she has history of CVA, DVT and aflutter (she was originally put on it for the DVT). Checked with PharmD who recommends refilling eliquis at this time.   I also contacted afib clinic to see if they could assess her sooner, but their next appointment is 04/04/22. Spoke with patient and confirmed history of CABG and CVA.

## 2022-03-29 NOTE — Telephone Encounter (Signed)
Pt has appt with dr Radford Pax on 1/26. If appt with AF clinic still needed after this visit please let us know. No sooner appts available in [facility] Clinic. Danae Chen RN notified of this as well.

## 2022-03-29 NOTE — Telephone Encounter (Signed)
2 weeks of eliquis samples to be provided to patient. Spoke to patient who states she can come and pick them up today. Reviewed dosage and frequency with patient who verbalizes understanding to pick up samples of eliquis and resumme 5 mg dose twice daily. Reviewed 04/05/22 appointment with patient.

## 2022-03-29 NOTE — Telephone Encounter (Signed)
Erica from [facility] called stating they are scheduled to see the pt on 04/05/22 but the pt needs a refill for ELIQUIS 5 MG TABS tablet. Office stated the pt had not been seen by them since 2021, most of her appts have been cancelled so they wouldn't be able to provide any samples of meds until her appt next Friday. Office is asking could Dr. [name] possibly refill meds? Erica's is requesting a call back from [place] to discuss further , Call back # [phone].

## 2022-03-30 MED ORDER — APIXABAN 5 MG PO TABS: 5.0000 mg | ORAL_TABLET | Freq: Two times a day (BID) | ORAL | 0 refills | Status: DC

## 2022-03-30 NOTE — Telephone Encounter (Signed)
ERx 1 mo supply

## 2022-03-30 NOTE — Telephone Encounter (Signed)
I sent in a 1 month supply of eliquis for patient. Thanks.

## 2022-04-02 ENCOUNTER — Ambulatory Visit: Payer: Medicare Other | Admitting: Podiatry

## 2022-04-02 ENCOUNTER — Encounter: Payer: Self-pay | Admitting: Family Medicine

## 2022-04-02 ENCOUNTER — Ambulatory Visit (INDEPENDENT_AMBULATORY_CARE_PROVIDER_SITE_OTHER): Payer: 59 | Admitting: Family Medicine

## 2022-04-02 VITALS — BP 126/64 | HR 85 | Temp 97.7°F | Ht 62.0 in | Wt 134.2 lb

## 2022-04-02 DIAGNOSIS — R300 Dysuria: Secondary | ICD-10-CM

## 2022-04-02 DIAGNOSIS — D649 Anemia, unspecified: Secondary | ICD-10-CM | POA: Diagnosis not present

## 2022-04-02 DIAGNOSIS — E1149 Type 2 diabetes mellitus with other diabetic neurological complication: Secondary | ICD-10-CM | POA: Diagnosis not present

## 2022-04-02 LAB — POCT URINALYSIS DIPSTICK
Bilirubin, UA: NEGATIVE
Glucose, UA: POSITIVE — AB
Ketones, UA: NEGATIVE
Leukocytes, UA: NEGATIVE
Nitrite, UA: POSITIVE
Protein, UA: NEGATIVE
Spec Grav, UA: 1.015 (ref 1.010–1.025)
Urobilinogen, UA: 0.2 E.U./dL
pH, UA: 5.5 (ref 5.0–8.0)

## 2022-04-02 NOTE — Patient Instructions (Addendum)
Double check you're taking Eliquis twice daily at home as it's not in your [facility] pill pack.  Continue to hold farxiga until UTI symptoms are fully resolved. Once UTI is fully better, restart farxiga '5mg'$  daily.  Labwork and urinalysis today.  Return in 3 months for follow up visit

## 2022-04-02 NOTE — Progress Notes (Signed)
Patient ID: Maria Fox, female    DOB: [birth year], 77 y.o.   MRN: [id number]  This visit was conducted in person.  BP 126/64   Pulse 85   Temp 97.7 F (36.5 C) (Temporal)   Ht '5\' 2"'$  (1.575 m)   Wt 134 lb 4 oz (60.9 kg)   SpO2 97%   BMI 24.55 kg/m    CC: hosp f/u visit  Subjective:   HPI: Maria Fox is a 77 y.o. female presenting on 04/02/2022 for Hospitalization Follow-up (Admitted on 02/24/23 at [facility], dx dehydration; diarrhea; nausea; type 2 DM.)   Last seen 10/2021.   Recent 15d hospitalization for dehydration due to nausea/vomiting and diarrhea as well as influenza treated with tamiflu and Klebsiella AND E coli UTI treated with ceftriaxone as well as possible bacterial pneumonia.  Found to have low cortisol levels but cosyntropin stimulation test was normal.  Hospital records reviewed. Med rec performed.  She's continued taking farxiga.  She's not taking lasix. Eliquis is not in her pill pack   Since home "feeling ok". Notes ongoing vaginal pain at end of the urinary stream. Worries still has UTI. No discharge or rash.  No dyspnea no chest pain, leg swelling, cough.   DM - not checking sugars. She does have One Touch glucometer and strips.  Lab Results  Component Value Date   HGBA1C 9.2 (H) 02/25/2022   Home health not set up.  Other follow up appointments scheduled: Dr Radford Pax cardiology 04/05/2022 ______________________________________________________________________ Hospital admission: 02/23/2022 Hospital discharge: 03/12/2022 TCM f/u phone call: performed 03/13/2022  Recommendations at discharge:  Follow up with PCP in 1 week   Discharge Diagnoses: Principal Problem:   Dehydration, moderate Active Problems:   Contact dermatitis   Influenza A   Hypertension, essential   Type 2 diabetes mellitus with neurological complications ([name])   Stage 3 chronic kidney disease due to type 2 diabetes mellitus ([name])   Chronic diastolic CHF (congestive heart failure)  ([name])   GERD (gastroesophageal reflux disease)   Atrial flutter with rapid ventricular response ([name])   Lobar pneumonia ([name])     Relevant past medical, surgical, family and social history reviewed and updated as indicated. Interim medical history since our last visit reviewed. Allergies and medications reviewed and updated. Outpatient Medications Prior to Visit  Medication Sig Dispense Refill   acetaminophen (TYLENOL) 500 MG tablet Take 500-1,000 mg by mouth every 8 (eight) hours as needed for mild pain or headache.     acyclovir ointment (ZOVIRAX) 5 % Apply topically 5 (five) times daily. For 4 days 15 g 1   albuterol (PROAIR HFA) 108 (90 Base) MCG/ACT inhaler Inhale 2 puffs into the lungs every 6 (six) hours as needed for wheezing or shortness of breath. 18 g 0   Aloe Vera 72 % CREA Apply to burned areas four times daily. 114 g 0   apixaban (ELIQUIS) 5 MG TABS tablet Take 1 tablet (5 mg total) by mouth 2 (two) times daily. 60 tablet 0   atorvastatin (LIPITOR) 40 MG tablet TAKE ONE TABLET BY MOUTH EVERY EVENING (Patient taking differently: Take 40 mg by mouth daily after supper.) 90 tablet 3   Blood Glucose Monitoring Suppl (ONE TOUCH ULTRA 2) w/Device KIT Use as instructed to check blood sugar 2 times daily as needed. 1 kit 0   clopidogrel (PLAVIX) 75 MG tablet Take 1 tablet (75 mg total) by mouth daily. (Patient taking differently: Take 75 mg by mouth in the morning.) 30  tablet 3   dapagliflozin propanediol (FARXIGA) 5 MG TABS tablet Take 1 tablet (5 mg total) by mouth daily before breakfast. 30 tablet 9   docusate sodium (COLACE) 100 MG capsule Take 1 capsule (100 mg total) by mouth daily. (Patient taking differently: Take 100 mg by mouth daily as needed for mild constipation.) 30 capsule 6   dorzolamide-timolol (COSOPT) 2-0.5 % ophthalmic solution Place 1 drop into both eyes 2 (two) times daily.     DULoxetine (CYMBALTA) 60 MG capsule Take 1 capsule (60 mg total) by mouth daily. (Patient  taking differently: Take 30 mg by mouth daily.) 30 capsule 0   feeding supplement, GLUCERNA SHAKE, (GLUCERNA SHAKE) LIQD Take 237 mLs by mouth 3 (three) times daily between meals.  0   furosemide (LASIX) 20 MG tablet Take 1 tablet (20 mg total) by mouth daily as needed for fluid or edema (leg swelling). 30 tablet    gabapentin (NEURONTIN) 300 MG capsule TAKE ONE CAPSULE BY MOUTH TWICE DAILY (Patient taking differently: Take 300 mg by mouth 2 (two) times daily.) 180 capsule 1   glipiZIDE (GLUCOTROL) 10 MG tablet Take 1 tablet (10 mg total) by mouth 2 (two) times daily. 180 tablet 3   glucose blood (ONETOUCH ULTRA) test strip Use as instructed to check blood sugar 2 times daily as needed 100 strip 3   isosorbide mononitrate (IMDUR) 30 MG 24 hr tablet TAKE 1/2 TABLET BY MOUTH ONCE DAILY . (Patient taking differently: 15 mg daily with breakfast.) 15 tablet 3   latanoprost (XALATAN) 0.005 % ophthalmic solution Place 1 drop into both eyes at bedtime.      metFORMIN (GLUCOPHAGE-XR) 500 MG 24 hr tablet TAKE TWO TABLETS BY MOUTH EVERY MORNING (Patient taking differently: Take 1,000 mg by mouth daily with breakfast.) 180 tablet 3   metoprolol succinate (TOPROL-XL) 25 MG 24 hr tablet Take 1.5 tablets (37.5 mg total) by mouth daily. (Patient taking differently: Take 25 mg by mouth daily.) 45 tablet 0   nitroGLYCERIN (NITROSTAT) 0.4 MG SL tablet Dissolve 1 tab under tongue as needed for chest pain. May repeat every 5 minutes x 2 doses. If no relief call 9-1-1. (Patient taking differently: Place 0.4 mg under the tongue every 5 (five) minutes x 2 doses as needed for chest pain (CALL 9-1-1, if no relief).) 25 tablet 2   ondansetron (ZOFRAN-ODT) 4 MG disintegrating tablet Take 1 tablet (4 mg total) by mouth every 8 (eight) hours as needed for nausea or vomiting. 20 tablet 1   OneTouch Delica Lancets 45W MISC Use as directed to check blood sugar 2 times daily as needed 100 each 3   pantoprazole (PROTONIX) 40 MG tablet  TAKE ONE TABLET BY MOUTH TWICE DAILY (Patient taking differently: Take 40 mg by mouth 2 (two) times daily before a meal.) 180 tablet 1   benzonatate (TESSALON) 100 MG capsule Take 1 capsule (100 mg total) by mouth 3 (three) times daily as needed for cough. 20 capsule 0   butalbital-acetaminophen-caffeine (FIORICET) 50-325-40 MG tablet Take 1 tablet by mouth every 6 (six) hours as needed for headache. 14 tablet 0   melatonin 5 MG TABS Take 1 tablet (5 mg total) by mouth at bedtime. 30 tablet 0   polyethylene glycol (MIRALAX / GLYCOLAX) 17 g packet Take 17 g by mouth daily as needed for moderate constipation. 14 each 0   No facility-administered medications prior to visit.     Per HPI unless specifically indicated in ROS section below Review of Systems  Objective:  BP 126/64   Pulse 85   Temp 97.7 F (36.5 C) (Temporal)   Ht '5\' 2"'$  (1.575 m)   Wt 134 lb 4 oz (60.9 kg)   SpO2 97%   BMI 24.55 kg/m   Wt Readings from Last 3 Encounters:  04/02/22 134 lb 4 oz (60.9 kg)  03/12/22 139 lb 14.1 oz (63.5 kg)  11/06/21 138 lb 8 oz (62.8 kg)      Physical Exam Vitals and nursing note reviewed.  Constitutional:      Appearance: Normal appearance. She is not ill-appearing.  HENT:     Mouth/Throat:     Mouth: Mucous membranes are moist.     Pharynx: Oropharynx is clear. No oropharyngeal exudate or posterior oropharyngeal erythema.  Eyes:     Extraocular Movements: Extraocular movements intact.     Pupils: Pupils are equal, round, and reactive to light.  Cardiovascular:     Rate and Rhythm: Normal rate and regular rhythm.     Pulses: Normal pulses.     Heart sounds: Normal heart sounds. No murmur heard. Pulmonary:     Effort: Pulmonary effort is normal. No respiratory distress.     Breath sounds: Normal breath sounds. No wheezing, rhonchi or rales.  Musculoskeletal:     Right lower leg: No edema.     Left lower leg: No edema.  Skin:    General: Skin is warm and dry.     Findings: No  rash.  Neurological:     Mental Status: She is alert.  Psychiatric:        Mood and Affect: Mood normal.        Behavior: Behavior normal.       Results for orders placed or performed in visit on 04/02/22  Urine Culture   Specimen: Urine  Result Value Ref Range   MICRO NUMBER: [id number]    SPECIMEN QUALITY: Adequate    Sample Source URINE    STATUS: PRELIMINARY    ISOLATE 1: Gram negative bacilli isolated (A)   CBC with Differential/Platelet  Result Value Ref Range   WBC 9.2 4.0 - 10.5 K/uL   RBC 4.34 3.87 - 5.11 Mil/uL   Hemoglobin 13.0 12.0 - 15.0 g/dL   HCT 40.0 36.0 - 46.0 %   MCV 92.1 78.0 - 100.0 fl   MCHC 32.6 30.0 - 36.0 g/dL   RDW 15.6 (H) 11.5 - 15.5 %   Platelets 318.0 150.0 - 400.0 K/uL   Neutrophils Relative % 58.3 43.0 - 77.0 %   Lymphocytes Relative 29.4 12.0 - 46.0 %   Monocytes Relative 6.8 3.0 - 12.0 %   Eosinophils Relative 4.2 0.0 - 5.0 %   Basophils Relative 1.3 0.0 - 3.0 %   Neutro Abs 5.3 1.4 - 7.7 K/uL   Lymphs Abs 2.7 0.7 - 4.0 K/uL   Monocytes Absolute 0.6 0.1 - 1.0 K/uL   Eosinophils Absolute 0.4 0.0 - 0.7 K/uL   Basophils Absolute 0.1 0.0 - 0.1 K/uL  Comprehensive metabolic panel  Result Value Ref Range   Sodium 137 135 - 145 mEq/L   Potassium 4.4 3.5 - 5.1 mEq/L   Chloride 105 96 - 112 mEq/L   CO2 24 19 - 32 mEq/L   Glucose, Bld 179 (H) 70 - 99 mg/dL   BUN 13 6 - 23 mg/dL   Creatinine, Ser 1.36 (H) 0.40 - 1.20 mg/dL   Total Bilirubin 0.3 0.2 - 1.2 mg/dL   Alkaline Phosphatase 86 39 - 117  U/L   AST 13 0 - 37 U/L   ALT 10 0 - 35 U/L   Total Protein 7.2 6.0 - 8.3 g/dL   Albumin 4.2 3.5 - 5.2 g/dL   GFR 37.17 (L) >60.00 mL/min   Calcium 9.2 8.4 - 10.5 mg/dL  Ferritin  Result Value Ref Range   Ferritin 11.1 10.0 - 291.0 ng/mL  IBC panel  Result Value Ref Range   Iron 43 42 - 145 ug/dL   Transferrin 236.0 212.0 - 360.0 mg/dL   Saturation Ratios 13.0 (L) 20.0 - 50.0 %   TIBC 330.4 250.0 - 450.0 mcg/dL  POCT urinalysis dipstick   Result Value Ref Range   Color, UA yellow    Clarity, UA cloudy    Glucose, UA Positive (A) Negative   Bilirubin, UA negative    Ketones, UA negative    Spec Grav, UA 1.015 1.010 - 1.025   Blood, UA +/-    pH, UA 5.5 5.0 - 8.0   Protein, UA Negative Negative   Urobilinogen, UA 0.2 0.2 or 1.0 E.U./dL   Nitrite, UA positive    Leukocytes, UA Negative Negative   Appearance     Odor       Assessment & Plan:   Problem List Items Addressed This Visit     Type 2 diabetes mellitus with neurological complications ([name]) - Primary    Chronic, uncontrolled despite farxiga '5mg'$ , glipizide '10mg'$  bid, metformin XR '1000mg'$  daily. She is currently not on insulin. Encouraged she start checking sugars more regularly with glucometer at home.  Given recent UTI with ongoing symptoms, advised hold farxiga until UTI symptoms are resolved. If recurrent UTI symptoms ,consider switch off SGLT2i.       Relevant Orders   Comprehensive metabolic panel (Completed)   Dysuria    Update UA in ongoing vaginal discomfort at end of stream.  UA suspicious for infection - send culture.  Sulfa and PCN allergies. Recent klebsiella UTI treated with ceftriaxone.  Will start keflex '500mg'$  BID x 1 wk       Relevant Orders   POCT urinalysis dipstick (Completed)   Urine Culture (Completed)   Anemia    Anemia noted in hospital - update CBC with iron studies. Will likely start low dose oral iron supplementation if tolerated.       Relevant Medications   ferrous sulfate (SLOW IRON) 160 (50 Fe) MG TBCR SR tablet   Other Relevant Orders   CBC with Differential/Platelet (Completed)   Ferritin (Completed)   IBC panel (Completed)     Meds ordered this encounter  Medications   ferrous sulfate (SLOW IRON) 160 (50 Fe) MG TBCR SR tablet    Sig: Take 1 tablet (160 mg total) by mouth every other day.    Dispense:  30 tablet    Refill:  0   cephALEXin (KEFLEX) 500 MG capsule    Sig: Take 1 capsule (500 mg total) by mouth  2 (two) times daily.    Dispense:  14 capsule    Refill:  0    Orders Placed This Encounter  Procedures   Urine Culture   CBC with Differential/Platelet   Comprehensive metabolic panel   Ferritin   IBC panel   POCT urinalysis dipstick    Patient Instructions  Double check you're taking Eliquis twice daily at home as it's not in your [facility] pill pack.  Continue to hold farxiga until UTI symptoms are fully resolved. Once UTI is fully better, restart farxiga '5mg'$   daily.  Labwork and urinalysis today.  Return in 3 months for follow up visit   Follow up plan: Return in about 3 months (around 07/02/2022) for follow up visit.  [name], MD

## 2022-04-03 LAB — COMPREHENSIVE METABOLIC PANEL
ALT: 10 U/L (ref 0–35)
AST: 13 U/L (ref 0–37)
Albumin: 4.2 g/dL (ref 3.5–5.2)
Alkaline Phosphatase: 86 U/L (ref 39–117)
BUN: 13 mg/dL (ref 6–23)
CO2: 24 mEq/L (ref 19–32)
Calcium: 9.2 mg/dL (ref 8.4–10.5)
Chloride: 105 mEq/L (ref 96–112)
Creatinine, Ser: 1.36 mg/dL — ABNORMAL HIGH (ref 0.40–1.20)
GFR: 37.17 mL/min — ABNORMAL LOW (ref >60.00)
Glucose, Bld: 179 mg/dL — ABNORMAL HIGH (ref 70–99)
Potassium: 4.4 mEq/L (ref 3.5–5.1)
Sodium: 137 mEq/L (ref 135–145)
Total Bilirubin: 0.3 mg/dL (ref 0.2–1.2)
Total Protein: 7.2 g/dL (ref 6.0–8.3)

## 2022-04-03 LAB — CBC WITH DIFFERENTIAL/PLATELET
Basophils Absolute: 0.1 10*3/uL (ref 0.0–0.1)
Basophils Relative: 1.3 % (ref 0.0–3.0)
Eosinophils Absolute: 0.4 10*3/uL (ref 0.0–0.7)
Eosinophils Relative: 4.2 % (ref 0.0–5.0)
HCT: 40 % (ref 36.0–46.0)
Hemoglobin: 13 g/dL (ref 12.0–15.0)
Lymphocytes Relative: 29.4 % (ref 12.0–46.0)
Lymphs Abs: 2.7 10*3/uL (ref 0.7–4.0)
MCHC: 32.6 g/dL (ref 30.0–36.0)
MCV: 92.1 fl (ref 78.0–100.0)
Monocytes Absolute: 0.6 10*3/uL (ref 0.1–1.0)
Monocytes Relative: 6.8 % (ref 3.0–12.0)
Neutro Abs: 5.3 10*3/uL (ref 1.4–7.7)
Neutrophils Relative %: 58.3 % (ref 43.0–77.0)
Platelets: 318 10*3/uL (ref 150.0–400.0)
RBC: 4.34 Mil/uL (ref 3.87–5.11)
RDW: 15.6 % — ABNORMAL HIGH (ref 11.5–15.5)
WBC: 9.2 10*3/uL (ref 4.0–10.5)

## 2022-04-03 LAB — IBC PANEL
Iron: 43 ug/dL (ref 42–145)
Saturation Ratios: 13 % — ABNORMAL LOW (ref 20.0–50.0)
TIBC: 330.4 ug/dL (ref 250.0–450.0)
Transferrin: 236 mg/dL (ref 212.0–360.0)

## 2022-04-03 LAB — FERRITIN: Ferritin: 11.1 ng/mL (ref 10.0–291.0)

## 2022-04-05 ENCOUNTER — Encounter: Payer: Self-pay | Admitting: Cardiology

## 2022-04-05 ENCOUNTER — Ambulatory Visit: Payer: 59 | Attending: Cardiology | Admitting: Cardiology

## 2022-04-05 VITALS — BP 104/56 | HR 82 | Ht 62.0 in | Wt 134.0 lb

## 2022-04-05 DIAGNOSIS — I951 Orthostatic hypotension: Secondary | ICD-10-CM | POA: Diagnosis not present

## 2022-04-05 DIAGNOSIS — R0602 Shortness of breath: Secondary | ICD-10-CM

## 2022-04-05 DIAGNOSIS — I5032 Chronic diastolic (congestive) heart failure: Secondary | ICD-10-CM

## 2022-04-05 DIAGNOSIS — I4892 Unspecified atrial flutter: Secondary | ICD-10-CM

## 2022-04-05 DIAGNOSIS — I2583 Coronary atherosclerosis due to lipid rich plaque: Secondary | ICD-10-CM

## 2022-04-05 DIAGNOSIS — D649 Anemia, unspecified: Secondary | ICD-10-CM | POA: Insufficient documentation

## 2022-04-05 DIAGNOSIS — I10 Essential (primary) hypertension: Secondary | ICD-10-CM | POA: Diagnosis not present

## 2022-04-05 DIAGNOSIS — I2510 Atherosclerotic heart disease of native coronary artery without angina pectoris: Secondary | ICD-10-CM | POA: Diagnosis not present

## 2022-04-05 LAB — URINE CULTURE
MICRO NUMBER:: 14467067
SPECIMEN QUALITY:: ADEQUATE

## 2022-04-05 MED ORDER — SLOW IRON 160 (50 FE) MG PO TBCR: 1.0000 | EXTENDED_RELEASE_TABLET | ORAL | 0 refills | Status: DC

## 2022-04-05 MED ORDER — APIXABAN 5 MG PO TABS: 5.0000 mg | ORAL_TABLET | Freq: Two times a day (BID) | ORAL | 0 refills | Status: DC

## 2022-04-05 MED ORDER — METOPROLOL SUCCINATE ER 25 MG PO TB24: 25.0000 mg | ORAL_TABLET | Freq: Every day | ORAL | 3 refills | Status: DC

## 2022-04-05 MED ORDER — CEPHALEXIN 500 MG PO CAPS: 500.0000 mg | ORAL_CAPSULE | Freq: Two times a day (BID) | ORAL | 0 refills | Status: DC

## 2022-04-05 NOTE — Addendum Note (Signed)
Addended by: Joni Reining on: 04/05/2022 03:27 PM   Modules accepted: Orders

## 2022-04-05 NOTE — Patient Instructions (Signed)
Medication Instructions:  Your physician has decreased your TOPROL XL dose. Stop taking 37.5 mg of Toprol XL. Start taking 25 mg of Toprol XL.  *If you need a refill on your cardiac medications before your next appointment, please call your pharmacy*   Lab Work: None. If you have labs (blood work) drawn today and your tests are completely normal, you will receive your results only by: [facility] (if you have MyChart) OR A paper copy in the mail If you have any lab test that is abnormal or we need to change your treatment, we will call you to review the results.   Testing/Procedures: None.   Follow-Up: At [facility], you and your health needs are our priority.  As part of our continuing mission to provide you with exceptional heart care, we have created designated Provider Care Teams.  These Care Teams include your primary Cardiologist (physician) and Advanced Practice Providers (APPs -  Physician Assistants and Nurse Practitioners) who all work together to provide you with the care you need, when you need it.  We recommend signing up for the patient portal called "MyChart".  Sign up information is provided on this After Visit Summary.  MyChart is used to connect with patients for Virtual Visits (Telemedicine).  Patients are able to view lab/test results, encounter notes, upcoming appointments, etc.  Non-urgent messages can be sent to your provider as well.   To learn more about what you can do with MyChart, go to [email or web address].    Your next appointment:   6 month(s)  Provider:   Fransico Him, MD
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
## 2022-04-05 NOTE — Assessment & Plan Note (Signed)
Chronic, uncontrolled despite farxiga '5mg'$ , glipizide '10mg'$  bid, metformin XR '1000mg'$  daily. She is currently not on insulin. Encouraged she start checking sugars more regularly with glucometer at home.  Given recent UTI with ongoing symptoms, advised hold farxiga until UTI symptoms are resolved. If recurrent UTI symptoms ,consider switch off SGLT2i.

## 2022-04-05 NOTE — Assessment & Plan Note (Addendum)
Anemia noted in hospital - update CBC with iron studies. Will likely start low dose oral iron supplementation if tolerated.

## 2022-04-05 NOTE — Assessment & Plan Note (Addendum)
Update UA in ongoing vaginal discomfort at end of stream.  UA suspicious for infection - send culture.  Sulfa and PCN allergies. Recent klebsiella UTI treated with ceftriaxone.  Will start keflex '500mg'$  BID x 1 wk

## 2022-04-05 NOTE — Addendum Note (Signed)
Addended by: Joni Reining on: 04/05/2022 03:32 PM   Modules accepted: Orders

## 2022-04-06 ENCOUNTER — Other Ambulatory Visit: Payer: Self-pay | Admitting: Family Medicine

## 2022-04-06 MED ORDER — CIPROFLOXACIN HCL 250 MG PO TABS: 250.0000 mg | ORAL_TABLET | Freq: Two times a day (BID) | ORAL | 0 refills | Status: DC

## 2022-04-09 ENCOUNTER — Telehealth: Payer: Self-pay | Admitting: *Deleted

## 2022-04-09 NOTE — Telephone Encounter (Signed)
Tried to call the patient back and got the same message that I received earlier today "can not be completed as dialed.  Aaron Edelman  please confirm the call back number.

## 2022-04-09 NOTE — Telephone Encounter (Signed)
Patient sister-in-law called to called [name] instead of brother because he is at work. Call back number [phone]

## 2022-04-09 NOTE — Telephone Encounter (Signed)
Left a message on patient's brother's voicemail to have her call the office back. When patient calls back she needs to be given her lab results.

## 2022-04-12 NOTE — Telephone Encounter (Signed)
Patient returned call. See results given to patient.

## 2022-04-15 ENCOUNTER — Other Ambulatory Visit: Payer: Self-pay | Admitting: Cardiology

## 2022-04-15 DIAGNOSIS — E785 Hyperlipidemia, unspecified: Secondary | ICD-10-CM

## 2022-04-18 ENCOUNTER — Telehealth: Payer: Self-pay

## 2022-04-18 NOTE — Progress Notes (Signed)
Care Management & Coordination Services Pharmacy Team  Reason for Encounter: Medication coordination and delivery  Contacted patient to discuss medications and coordinate delivery from [facility]. Spoke with patient on 04/18/2022  Cycle dispensing form sent to [facility]  for review.   Last adherence delivery date:03/27/22      Patient is due for next adherence delivery on: 04/26/22  This delivery to include: Adherence Packaging  30 Days  Farxiga 5 mg - 1 tablet daily (1 breakfast)  Pantoprazole 40 mg - 1 tablet twice daily (1 breakfast, 1 evening meal) Gabapentin 300 mg- 1 tablet twice daily (1 breakfast, 1 evening meal) Duloxetine 30 mg - 1 capsule daily (1 breakfast) Atorvastatin 40 mg - 1 tablet daily (1 evening meal) Isosorbide Mononitrate 30 mg - 1/2 tablet daily (1/2 breakfast) Glipizide 10 mg - 1 tablet twice daily (1 breakfast, 1 evening meal) Metformin 500 mg ER - 2 tablets ( breakfast) Metoprolol succinate 25 mg ER - 1 tablet daily  (breakfast) Clopidogrel 75m - Take 1 tablet daily ( breakfast) Eliquis 537m take 1 breakfast- take 1 evening meal   Patient declined the following medications this month: none  Refills requested from providers include: clopidogrel,duloxetine,isosorbide  Confirmed delivery date of 04/26/22, advised patient that pharmacy will contact them the morning of delivery.   Any concerns about your medications? No  How often do you forget or accidentally miss a dose? Rarely  Do you use a pillbox? No  Is patient in packaging Yes  What is the date on your next pill pack? Patient not home to read date on pack   Any concerns or issues with your packaging?satisfied    Recent blood pressure readings are as follows:none available  Recent blood glucose readings are as follows:none available    Chart review: Recent office visits:  04/02/22-[name] Gutierrez,MD(PCP)-hospital f/u,encouraged checking sugars,hold farxiga until UTI clears-Will  start keflex 50014mID x 1 wk,Will likely start low dose oral iron supplementation .   Recent consult visits:  04/05/22-Traci Turner,MD-f/u CAD,stop Toprol 37.5mg15mart metoprolol 25mg47me at breakfast. Continue prescription drug management with Eliquis 5 mg twice daily with as needed refills  F/u 6 months  Hospital visits:  None in previous 6 months  Medications: Outpatient Encounter Medications as of 04/18/2022  Medication Sig   albuterol (PROAIR HFA) 108 (90 Base) MCG/ACT inhaler Inhale 2 puffs into the lungs every 6 (six) hours as needed for wheezing or shortness of breath.   apixaban (ELIQUIS) 5 MG TABS tablet Take 1 tablet (5 mg total) by mouth 2 (two) times daily.   atorvastatin (LIPITOR) 40 MG tablet TAKE ONE TABLET BY MOUTH EVERY EVENING   ciprofloxacin (CIPRO) 250 MG tablet Take 1 tablet (250 mg total) by mouth 2 (two) times daily.   clopidogrel (PLAVIX) 75 MG tablet Take 1 tablet (75 mg total) by mouth daily.   dapagliflozin propanediol (FARXIGA) 5 MG TABS tablet Take 1 tablet (5 mg total) by mouth daily before breakfast.   dorzolamide-timolol (COSOPT) 2-0.5 % ophthalmic solution Place 1 drop into both eyes 2 (two) times daily.   DULoxetine (CYMBALTA) 60 MG capsule Take 1 capsule (60 mg total) by mouth daily.   furosemide (LASIX) 20 MG tablet Take 1 tablet (20 mg total) by mouth daily as needed for fluid or edema (leg swelling).   gabapentin (NEURONTIN) 300 MG capsule TAKE ONE CAPSULE BY MOUTH TWICE DAILY   glipiZIDE (GLUCOTROL) 10 MG tablet Take 1 tablet (10 mg total) by mouth 2 (two) times daily.  isosorbide mononitrate (IMDUR) 30 MG 24 hr tablet TAKE 1/2 TABLET BY MOUTH ONCE DAILY   latanoprost (XALATAN) 0.005 % ophthalmic solution Place 1 drop into both eyes at bedtime.    metFORMIN (GLUCOPHAGE-XR) 500 MG 24 hr tablet TAKE TWO TABLETS BY MOUTH EVERY MORNING   metoprolol succinate (TOPROL XL) 25 MG 24 hr tablet Take 1 tablet (25 mg total) by mouth daily.   nitroGLYCERIN  (NITROSTAT) 0.4 MG SL tablet Dissolve 1 tab under tongue as needed for chest pain. May repeat every 5 minutes x 2 doses. If no relief call 9-1-1.   pantoprazole (PROTONIX) 40 MG tablet TAKE ONE TABLET BY MOUTH TWICE DAILY   No facility-administered encounter medications on file as of 04/18/2022.   BP Readings from Last 3 Encounters:  04/05/22 (!) 104/56  04/02/22 126/64  03/12/22 134/67    Pulse Readings from Last 3 Encounters:  04/05/22 82  04/02/22 85  03/12/22 69    Lab Results  Component Value Date/Time   HGBA1C 9.2 (H) 02/25/2022 03:55 AM   HGBA1C 9.5 (H) 10/30/2021 02:52 PM   Lab Results  Component Value Date   CREATININE 1.36 (H) 04/02/2022   BUN 13 04/02/2022   GFR 37.17 (L) 04/02/2022   GFRNONAA 45 (L) 03/12/2022   GFRAA 41 (L) 01/28/2020   NA 137 04/02/2022   K 4.4 04/02/2022   CALCIUM 9.2 04/02/2022   CO2 24 04/02/2022     Charlene Brooke, PharmD notified  Avel Sensor, [place] Assistant [phone]

## 2022-04-22 ENCOUNTER — Telehealth: Payer: Self-pay | Admitting: Pharmacist

## 2022-04-22 MED ORDER — DULOXETINE HCL 60 MG PO CPEP: 60.0000 mg | ORAL_CAPSULE | Freq: Every day | ORAL | 11 refills | Status: DC

## 2022-04-22 NOTE — Telephone Encounter (Signed)
Duloxetine was increased to 60 mg during recent hospitalization, she has finished 30-day course prescribed at discharge.  Patient will need new eRx for duloxetine 60 mg sent to pharmacy if she is to continue this dose. Routing to PCP.  Preferred pharmacy: [facility] - [place], [place] - [address] [place] [address] Phone: [phone] Fax: [phone]

## 2022-04-22 NOTE — Telephone Encounter (Signed)
Agree with this. [facility]'ll [facility] to [facility].

## 2022-05-08 ENCOUNTER — Ambulatory Visit: Payer: Medicare Other | Admitting: Podiatry

## 2022-05-14 ENCOUNTER — Other Ambulatory Visit: Payer: Self-pay | Admitting: Family Medicine

## 2022-05-14 DIAGNOSIS — K219 Gastro-esophageal reflux disease without esophagitis: Secondary | ICD-10-CM

## 2022-05-16 ENCOUNTER — Telehealth: Payer: Self-pay

## 2022-05-16 NOTE — Progress Notes (Signed)
Care Management & Coordination Services Pharmacy Team  Reason for Encounter: Medication coordination and delivery  Contacted patient to discuss medications and coordinate delivery from [facility]. Unsuccessful outreach. Left voicemail for patient to return call. Cycle dispensing form sent to [facility]  for review.   Last adherence delivery date:04/26/22      Patient is due for next adherence delivery on: 05/27/22  This delivery to include: Adherence Packaging  30 Days  Farxiga 5 mg - 1 tablet daily (1 breakfast)  Pantoprazole 40 mg - 1 tablet twice daily (1 breakfast, 1 evening meal) Gabapentin 300 mg- 1 tablet twice daily (1 breakfast, 1 evening meal) Duloxetine 30 mg - 1 capsule daily (1 breakfast) Atorvastatin 40 mg - 1 tablet daily (1 evening meal) Isosorbide Mononitrate 30 mg - 1/2 tablet daily (1/2 breakfast) Glipizide 10 mg - 1 tablet twice daily (1 breakfast, 1 evening meal) Metformin 500 mg ER - 2 tablets ( breakfast) Metoprolol succinate 25 mg ER - 1 tablet daily  (breakfast) Clopidogrel '75mg'$  - Take 1 tablet daily ( breakfast) Eliquis '5mg'$ - take 1 breakfast- take 1 evening meal   Vials: Latanoprost eye drops- use as directed Ondansetron '4mg'$ - take as needed  Refills requested from providers include: pantoprazole,eliquis  Delivery scheduled for 05/27/22. Unable to speak with patient to confirm date.     Chart review: Recent office visits:  None since last contact   Recent consult visits:  None since last contact   Hospital visits:  None in previous 6 months  Medications: Outpatient Encounter Medications as of 05/16/2022  Medication Sig   albuterol (PROAIR HFA) 108 (90 Base) MCG/ACT inhaler Inhale 2 puffs into the lungs every 6 (six) hours as needed for wheezing or shortness of breath.   apixaban (ELIQUIS) 5 MG TABS tablet Take 1 tablet (5 mg total) by mouth 2 (two) times daily.   atorvastatin (LIPITOR) 40 MG tablet TAKE ONE TABLET BY MOUTH EVERY  EVENING   ciprofloxacin (CIPRO) 250 MG tablet Take 1 tablet (250 mg total) by mouth 2 (two) times daily.   clopidogrel (PLAVIX) 75 MG tablet Take 1 tablet (75 mg total) by mouth daily.   dapagliflozin propanediol (FARXIGA) 5 MG TABS tablet Take 1 tablet (5 mg total) by mouth daily before breakfast.   dorzolamide-timolol (COSOPT) 2-0.5 % ophthalmic solution Place 1 drop into both eyes 2 (two) times daily.   DULoxetine (CYMBALTA) 60 MG capsule Take 1 capsule (60 mg total) by mouth daily.   furosemide (LASIX) 20 MG tablet Take 1 tablet (20 mg total) by mouth daily as needed for fluid or edema (leg swelling).   gabapentin (NEURONTIN) 300 MG capsule TAKE ONE CAPSULE BY MOUTH TWICE DAILY   glipiZIDE (GLUCOTROL) 10 MG tablet Take 1 tablet (10 mg total) by mouth 2 (two) times daily.   isosorbide mononitrate (IMDUR) 30 MG 24 hr tablet TAKE 1/2 TABLET BY MOUTH ONCE DAILY   latanoprost (XALATAN) 0.005 % ophthalmic solution Place 1 drop into both eyes at bedtime.    metFORMIN (GLUCOPHAGE-XR) 500 MG 24 hr tablet TAKE TWO TABLETS BY MOUTH EVERY MORNING   metoprolol succinate (TOPROL XL) 25 MG 24 hr tablet Take 1 tablet (25 mg total) by mouth daily.   nitroGLYCERIN (NITROSTAT) 0.4 MG SL tablet Dissolve 1 tab under tongue as needed for chest pain. May repeat every 5 minutes x 2 doses. If no relief call 9-1-1.   pantoprazole (PROTONIX) 40 MG tablet TAKE ONE TABLET BY MOUTH TWICE DAILY   No facility-administered encounter  medications on file as of 05/16/2022.   BP Readings from Last 3 Encounters:  04/05/22 (!) 104/56  04/02/22 126/64  03/12/22 134/67    Pulse Readings from Last 3 Encounters:  04/05/22 82  04/02/22 85  03/12/22 69    Lab Results  Component Value Date/Time   HGBA1C 9.2 (H) 02/25/2022 03:55 AM   HGBA1C 9.5 (H) 10/30/2021 02:52 PM   Lab Results  Component Value Date   CREATININE 1.36 (H) 04/02/2022   BUN 13 04/02/2022   GFR 37.17 (L) 04/02/2022   GFRNONAA 45 (L) 03/12/2022   GFRAA 41  (L) 01/28/2020   NA 137 04/02/2022   K 4.4 04/02/2022   CALCIUM 9.2 04/02/2022   CO2 24 04/02/2022     Charlene Brooke, PharmD notified  Avel Sensor, [place] Assistant [phone]

## 2022-05-21 ENCOUNTER — Other Ambulatory Visit: Payer: Self-pay | Admitting: Cardiology

## 2022-05-21 DIAGNOSIS — I4892 Unspecified atrial flutter: Secondary | ICD-10-CM

## 2022-05-21 NOTE — Telephone Encounter (Signed)
Pt last saw Dr. Radford Pax 04/05/22, last labs 04/02/22 Creat 1.36, age 77, weight 60.8kg, based on specified criteria pt is on appropriate dosage of Eliquis '5mg'$  BID for aflutter.  Will refill rx.

## 2022-05-22 ENCOUNTER — Emergency Department (HOSPITAL_COMMUNITY): Payer: 59

## 2022-05-22 ENCOUNTER — Emergency Department (HOSPITAL_COMMUNITY)
Admission: EM | Admit: 2022-05-22 | Discharge: 2022-05-22 | Disposition: A | Discharge status: Home / Self Care | Payer: 59 | Attending: Emergency Medicine | Admitting: Emergency Medicine

## 2022-05-22 ENCOUNTER — Encounter (HOSPITAL_COMMUNITY): Payer: Self-pay

## 2022-05-22 ENCOUNTER — Other Ambulatory Visit: Payer: Self-pay

## 2022-05-22 DIAGNOSIS — S52572A Other intraarticular fracture of lower end of left radius, initial encounter for closed fracture: Secondary | ICD-10-CM | POA: Insufficient documentation

## 2022-05-22 DIAGNOSIS — N1832 Chronic kidney disease, stage 3b: Secondary | ICD-10-CM | POA: Diagnosis not present

## 2022-05-22 DIAGNOSIS — I130 Hypertensive heart and chronic kidney disease with heart failure and stage 1 through stage 4 chronic kidney disease, or unspecified chronic kidney disease: Secondary | ICD-10-CM | POA: Insufficient documentation

## 2022-05-22 DIAGNOSIS — E1122 Type 2 diabetes mellitus with diabetic chronic kidney disease: Secondary | ICD-10-CM | POA: Insufficient documentation

## 2022-05-22 DIAGNOSIS — M7989 Other specified soft tissue disorders: Secondary | ICD-10-CM | POA: Diagnosis not present

## 2022-05-22 DIAGNOSIS — W010XXA Fall on same level from slipping, tripping and stumbling without subsequent striking against object, initial encounter: Secondary | ICD-10-CM | POA: Insufficient documentation

## 2022-05-22 DIAGNOSIS — S52502A Unspecified fracture of the lower end of left radius, initial encounter for closed fracture: Secondary | ICD-10-CM | POA: Diagnosis not present

## 2022-05-22 DIAGNOSIS — I5032 Chronic diastolic (congestive) heart failure: Secondary | ICD-10-CM | POA: Diagnosis not present

## 2022-05-22 DIAGNOSIS — Z8616 Personal history of COVID-19: Secondary | ICD-10-CM | POA: Diagnosis not present

## 2022-05-22 DIAGNOSIS — I2510 Atherosclerotic heart disease of native coronary artery without angina pectoris: Secondary | ICD-10-CM | POA: Insufficient documentation

## 2022-05-22 DIAGNOSIS — Z7901 Long term (current) use of anticoagulants: Secondary | ICD-10-CM | POA: Insufficient documentation

## 2022-05-22 DIAGNOSIS — S4992XA Unspecified injury of left shoulder and upper arm, initial encounter: Secondary | ICD-10-CM | POA: Diagnosis present

## 2022-05-22 DIAGNOSIS — S0990XA Unspecified injury of head, initial encounter: Secondary | ICD-10-CM | POA: Insufficient documentation

## 2022-05-22 DIAGNOSIS — S52612A Displaced fracture of left ulna styloid process, initial encounter for closed fracture: Secondary | ICD-10-CM | POA: Diagnosis not present

## 2022-05-22 DIAGNOSIS — Z8673 Personal history of transient ischemic attack (TIA), and cerebral infarction without residual deficits: Secondary | ICD-10-CM | POA: Insufficient documentation

## 2022-05-22 NOTE — ED Notes (Signed)
Ortho tech called 

## 2022-05-22 NOTE — Progress Notes (Signed)
Orthopedic Tech Progress Note Patient Details:  Maria Fox [birth year] [id number]  Ortho Devices Type of Ortho Device: Arm sling, Sugartong splint Ortho Device/Splint Location: lue Ortho Device/Splint Interventions: Ordered, Application, Adjustment   Post Interventions Patient Tolerated: Well Instructions Provided: Adjustment of device, Care of device  Karolee Stamps 05/22/2022, 2:46 AM

## 2022-05-22 NOTE — ED Triage Notes (Signed)
Pt states that she had a fall last Friday and injured her left arm. Pt has bruising and swelling to her left hand and wrist.

## 2022-05-22 NOTE — ED Provider Notes (Signed)
Emergency Department Provider Note   I have reviewed the triage vital signs and the nursing notes.   HISTORY  Chief Complaint Fall and Arm Pain   HPI Maria Fox is a 77 y.o. female past history reviewed below, on Eliquis, presents emergency department for evaluation of left wrist pain after fall 2 days ago.  She reports tripping and falling and putting out her left arm to catch herself.  She has had pain and swelling in the left wrist since that time along with some bruising.  She did strike her head as well but did not lose consciousness.  No pain in her neck or back.  No chest or belly pain. No numbness.  With continued pain she presents today for evaluation.   Past Medical History:  Diagnosis Date   Acute deep vein thrombosis (DVT) of left tibial vein ([name]) 07/11/2019   Acute left PCA stroke ([name]) 07/13/2015   Angioedema    Atrial flutter ([name])    Autoimmune deficiency syndrome ([name])    CAD (coronary artery disease), native coronary artery 06/2014   Chronic diastolic CHF (congestive heart failure) ([place]) 08/27/2014   Chronic kidney disease, stage 3b ([place])    Closed fracture of maxilla ([place])    COVID-19 virus infection 03/2020   CVA (cerebral infarction) 07/2014   bilateral corona radiata - periCABG   Dermatitis    eval [name] 2011: eczema, eval [name] 2011: bx negative for lichen simplex or derm herpetiformis   DM (diabetes mellitus), type 2, uncontrolled w/neurologic complication 0000000   ?autonomic neuropathy, gastroparesis (06/2014)    Epidermal cyst of neck 03/25/2017   Excised by derm Domenic Polite)   HCAP (healthcare-associated pneumonia) 06/2014   History of chicken pox    HLD (hyperlipidemia)    Hx of migraines    remote   Hypertension    Maxillary fracture ([name])    Mitral regurgitation    Multiple allergies    mold, wool, dust, feathers   NSVT (nonsustained ventricular tachycardia) ([name])    Orthostatic hypotension 07/2015   Pleural effusion, left     RBBB    S/P lens implant    left side ([name])   Tricuspid regurgitation    UTI (urinary tract infection) 06/2014   Vitiligo     Review of Systems  Constitutional: No fever/chills Cardiovascular: Denies chest pain. Respiratory: Denies shortness of breath. Gastrointestinal: No abdominal pain.  Musculoskeletal: Negative for back pain. Positive left wrist pain.  Skin: Negative for rash. Neurological: Negative for headaches, focal weakness or numbness.  ____________________________________________   PHYSICAL EXAM:  VITAL SIGNS: ED Triage Vitals  Enc Vitals Group     BP 05/22/22 0016 (!) 140/65     Pulse Rate 05/22/22 0016 73     Resp 05/22/22 0016 18     Temp 05/22/22 0016 98 F (36.7 C)     Temp Source 05/22/22 0016 Oral     SpO2 05/22/22 0016 100 %     Weight 05/22/22 0015 123 lb (55.8 kg)     Height 05/22/22 0015 '5\' 2"'$  (1.575 m)   Constitutional: Alert and oriented. Well appearing and in no acute distress. Eyes: Conjunctivae are normal.  Head: Atraumatic. Nose: No congestion/rhinnorhea. Mouth/Throat: Mucous membranes are moist.   Neck: No stridor.  No cervical spine tenderness to palpation. Cardiovascular: Normal rate, regular rhythm. Good peripheral circulation. Grossly normal heart sounds.   Respiratory: Normal respiratory effort.  No retractions. Lungs CTAB. Gastrointestinal: Soft and nontender. No distention.  Musculoskeletal: No lower  extremity tenderness nor edema. No gross deformities of extremities.  Mild tenderness, bruising, swelling to the left wrist without clear deformity.  Normal capillary refill and normal sensation.  Intact radial pulse.  Neurologic:  Normal speech and language. No gross focal neurologic deficits are appreciated.  Skin:  Skin is warm, dry and intact. No rash noted.  ____________________________________________  RADIOLOGY  CT Head Wo Contrast  Result Date: 05/22/2022 CLINICAL DATA:  Head trauma, minor (Age >= 65y) EXAM: CT HEAD  WITHOUT CONTRAST TECHNIQUE: Contiguous axial images were obtained from the base of the skull through the vertex without intravenous contrast. RADIATION DOSE REDUCTION: This exam was performed according to the departmental dose-optimization program which includes automated exposure control, adjustment of the mA and/or kV according to patient size and/or use of iterative reconstruction technique. COMPARISON:  CT head 03/08/2022 FINDINGS: Brain: Cerebral ventricle sizes are concordant with the degree of cerebral volume loss. Patchy and confluent areas of decreased attenuation are noted throughout the deep and periventricular white matter of the cerebral hemispheres bilaterally, compatible with chronic microvascular ischemic disease. Left occipital lobe encephalomalacia. No evidence of large-territorial acute infarction. No parenchymal hemorrhage. No mass lesion. No extra-axial collection. No mass effect or midline shift. No hydrocephalus. Basilar cisterns are patent. Vascular: No hyperdense vessel. Atherosclerotic calcifications are present within the cavernous internal carotid and vertebral arteries. Skull: No acute fracture or focal lesion. Sinuses/Orbits: Almost complete opacification of the right maxillary sinus. Otherwise paranasal sinuses and mastoid air cells are clear. Bilateral lens replacement. Otherwise the orbits are unremarkable. Other: None. IMPRESSION: No acute intracranial abnormality. Electronically Signed   By: Iven Finn M.D.   On: 05/22/2022 02:18   DG Hand Complete Left  Result Date: 05/22/2022 CLINICAL DATA:  Patient fell on 03/08 and injured wrist and hand with bruising and pain in the 2nd-5th metacarpal area in entire wrist is swollen. EXAM: LEFT HAND - COMPLETE 3+ VIEW; LEFT WRIST - COMPLETE 3+ VIEW COMPARISON:  And radiographs 07/04/2021 FINDINGS: Acute nondisplaced impacted fracture of the distal left radial metaphysis. Minimally displaced ulnar styloid fracture. Degenerative  arthritis about the IP joints. Soft tissue swelling about the wrist. Remote posttraumatic deformity about the proximal phalanx of the fifth finger. IMPRESSION: Acute nondisplaced impacted fracture of the distal radius. Mildly displaced ulnar styloid fracture. Electronically Signed   By: Placido Sou M.D.   On: 05/22/2022 00:44   DG Wrist Complete Left  Result Date: 05/22/2022 CLINICAL DATA:  Patient fell on 03/08 and injured wrist and hand with bruising and pain in the 2nd-5th metacarpal area in entire wrist is swollen. EXAM: LEFT HAND - COMPLETE 3+ VIEW; LEFT WRIST - COMPLETE 3+ VIEW COMPARISON:  And radiographs 07/04/2021 FINDINGS: Acute nondisplaced impacted fracture of the distal left radial metaphysis. Minimally displaced ulnar styloid fracture. Degenerative arthritis about the IP joints. Soft tissue swelling about the wrist. Remote posttraumatic deformity about the proximal phalanx of the fifth finger. IMPRESSION: Acute nondisplaced impacted fracture of the distal radius. Mildly displaced ulnar styloid fracture. Electronically Signed   By: Placido Sou M.D.   On: 05/22/2022 00:44    ____________________________________________   PROCEDURES  Procedure(s) performed:   Procedures  None  ____________________________________________   INITIAL IMPRESSION / ASSESSMENT AND PLAN / ED COURSE  Pertinent labs & imaging results that were available during my care of the patient were reviewed by me and considered in my medical decision making (see chart for details).   This patient is Presenting for Evaluation of fall, which does require a  range of treatment options, and is a complaint that involves a high risk of morbidity and mortality.  The Differential Diagnoses include wrist fracture, dislocation, contusion, head injury leading to [place] on eliquis, etc.   Radiologic Tests Ordered, included wrist and hand x-ray along with CT head. I independently interpreted the images and agree with  radiology interpretation.    Medical Decision Making: Summary:  Patient presents emergency department valuation after mechanical fall 2 days ago.  She has intact neuroexam but given she is on Eliquis and 77 years old I do plan for CT imaging of her head which is not ordered in triage.  X-ray from triage does show impacted distal radius fracture and ulnar styloid fracture.  No significant displacement.  Plan for splinting and orthopedic/hand follow-up which I provided in the AVS for the patient to call for an appointment.   Reevaluation with update and discussion with patient. Splint placed and instructed to call hand surgery in the AM for a follow up. CT head reassuring. Stable for discharge.   Patient's presentation is most consistent with acute presentation with potential threat to life or bodily function.   Disposition: discharge  ____________________________________________  FINAL CLINICAL IMPRESSION(S) / ED DIAGNOSES  Final diagnoses:  Other closed intra-articular fracture of distal end of left radius, initial encounter  Injury of head, initial encounter    Note:  This document was prepared using Dragon voice recognition software and may include unintentional dictation errors.  Nanda Quinton, MD, [facility] Emergency Medicine    Embrie Mikkelsen, [facility], MD 05/22/22 [phone]

## 2022-05-22 NOTE — Discharge Instructions (Signed)
Please call your PCP and orthopedic doctor. I have listed the on-call hand surgeon as well. You may call the office for follow up of your wrist fracture. Please have them see you in the next week.

## 2022-05-24 DIAGNOSIS — S52502A Unspecified fracture of the lower end of left radius, initial encounter for closed fracture: Secondary | ICD-10-CM | POA: Diagnosis not present

## 2022-05-24 DIAGNOSIS — M25532 Pain in left wrist: Secondary | ICD-10-CM | POA: Diagnosis not present

## 2022-05-29 ENCOUNTER — Encounter: Payer: 59 | Admitting: Pharmacist

## 2022-05-29 ENCOUNTER — Telehealth: Payer: Self-pay | Admitting: Pharmacist

## 2022-05-29 NOTE — Progress Notes (Unsigned)
Care Management & Coordination Services Pharmacy Note  05/29/2022 Name:  Maria Fox MRN:  [id number] DOB:  December 17, 1945  Summary: F/U visit -Pt endorses compliance with pill packs -DM: A1c 9.2% (02/2022), glipizide was increased to 10 mg BID last month; She is not compliant with glucose testing and has not set up [employer] (which has been sent to her home), she declines office visit for CGM training, will consider Home visit with PharmD -Aflutter/hx DVT: pt has been on Eliquis since DVT 07/2019, initially planned for 6 month duration but pt was lost to follow up and has continued. Aflutter also occurred in acute setting of DVT/PE, unclear if this is a chronic issue. Unclear if Eliquis is still needed. Pt has scheduled her overdue follow up with Cardiology (Jan 2024)   Recommendations/Changes made from today's visit: -Consider home visit for CGM training- pt will call back if interested  Follow up plan: -[facility] will call patient *** -Pharmacist follow up televisit scheduled for *** -PCP appt 07/02/22; Cardiology appt 10/07/22     Subjective: Maria Fox is an 77 y.o. year old female who is a primary patient of [name], MD.  The care coordination team was consulted for assistance with disease management and care coordination needs.    Engaged with patient by telephone for follow up visit.  Recent office visits: 04/02/22 Dr [name] OV: hospital f/u - advised to hold Farxiga until UTI sx resolve. Start Keflex for UTI. Low iron - start slow release iron supplement. Ucx - resistant to Keflex, switch to Cipro.  Recent consult visits: 04/05/22 Dr Radford Pax (Cardiology): f/u CAD, Afib, HF. Reduce metoprolol to 25 mg.  Hospital visits: 05/22/22 ED visit ([facility]): fall, wrist fracture. CT head wnl.   02/23/22 - 03/12/22 Admission ([facility]): Flu/PNA - dehydration; persistent nausea in hospital; rec GI f/u outpatient. [name] held d/t UTI.    Objective:  Lab Results  Component  Value Date   CREATININE 1.36 (H) 04/02/2022   BUN 13 04/02/2022   GFR 37.17 (L) 04/02/2022   GFRNONAA 45 (L) 03/12/2022   GFRAA 41 (L) 01/28/2020   NA 137 04/02/2022   K 4.4 04/02/2022   CALCIUM 9.2 04/02/2022   CO2 24 04/02/2022   GLUCOSE 179 (H) 04/02/2022    Lab Results  Component Value Date/Time   HGBA1C 9.2 (H) 02/25/2022 03:55 AM   HGBA1C 9.5 (H) 10/30/2021 02:52 PM   FRUCTOSAMINE 302 (H) 11/10/2015 04:27 PM   GFR 37.17 (L) 04/02/2022 03:38 PM   GFR 32.12 (L) 10/30/2021 02:52 PM   MICROALBUR 1.7 10/30/2021 02:52 PM   MICROALBUR <0.7 05/02/2020 03:25 PM    Last diabetic Eye exam:  Lab Results  Component Value Date/Time   HMDIABEYEEXA Retinopathy (A) 01/02/2022 12:00 AM    Last diabetic Foot exam: No results found for: "HMDIABFOOTEX"   Lab Results  Component Value Date   CHOL 102 10/30/2021   HDL 34.40 (L) 10/30/2021   LDLCALC 40 05/02/2020   LDLDIRECT 48.0 10/30/2021   TRIG 280.0 (H) 10/30/2021   CHOLHDL 3 10/30/2021       Latest Ref Rng & Units 04/02/2022    3:38 PM 03/12/2022    3:19 AM 03/11/2022   10:33 AM  Hepatic Function  Total Protein 6.0 - 8.3 g/dL 7.2  6.2  5.9   Albumin 3.5 - 5.2 g/dL 4.2  3.0  3.1   AST 0 - 37 U/L 13  44  38   ALT 0 - 35 U/L 10  30  25   Alk Phosphatase 39 - 117 U/L 86  78  66   Total Bilirubin 0.2 - 1.2 mg/dL 0.3  0.6  0.4     Lab Results  Component Value Date/Time   TSH 1.187 07/15/2020 06:11 AM   TSH 1.247 07/10/2019 10:27 AM   TSH 2.64 05/02/2017 04:13 PM   TSH 3.457 07/08/2014 05:00 AM   FREET4 0.87 07/13/2014 11:18 AM       Latest Ref Rng & Units 04/02/2022    3:38 PM 03/12/2022    3:19 AM 03/11/2022   10:33 AM  CBC  WBC 4.0 - 10.5 K/uL 9.2  5.4  5.5   Hemoglobin 12.0 - 15.0 g/dL 13.0  10.8  10.7   Hematocrit 36.0 - 46.0 % 40.0  33.4  33.1   Platelets 150.0 - 400.0 K/uL 318.0  212  191     Lab Results  Component Value Date/Time   VD25OH 29.11 (L) 10/30/2021 02:52 PM   VD25OH 32.36 05/02/2020 03:25 PM    VITAMINB12 759 10/30/2021 02:52 PM   VITAMINB12 397 01/26/2021 04:25 PM    Clinical ASCVD: Yes  The ASCVD Risk score (Arnett DK, et al., 2019) failed to calculate for the following reasons:   The patient has a prior MI or stroke diagnosis    CHA2DS2/VAS Stroke Risk Points  Current as of 3 hours ago     9 >= 2 Points: High Risk    Points Metrics  1 Has Congestive Heart Failure:  Yes    Current as of 3 hours ago  1 Has Vascular Disease:  Yes    Current as of 3 hours ago  1 Has Hypertension:  Yes    Current as of 3 hours ago  2 Age:  77    Current as of 3 hours ago  1 Has Diabetes:  Yes    Current as of 3 hours ago  2 Had Stroke:  Yes  Had TIA:  No  Had Thromboembolism:  No    Current as of 3 hours ago  1 Female:  Yes    Current as of 3 hours ago       04/02/2022    3:02 PM 10/25/2021   11:16 AM 06/06/2020    3:38 PM  Depression screen PHQ 2/9  Decreased Interest 0 3 2  Down, Depressed, Hopeless 0 2 2  PHQ - 2 Score 0 5 4  Altered sleeping  0 0  Tired, decreased energy  0 0  Change in appetite  0 0  Feeling bad or failure about yourself   0 0  Trouble concentrating  0 0  Moving slowly or fidgety/restless  0 0  Suicidal thoughts  0 0  PHQ-9 Score  5 4  Difficult doing work/chores  Somewhat difficult Not difficult at all     Social History   Tobacco Use  Smoking Status Never  Smokeless Tobacco Never   BP Readings from Last 3 Encounters:  05/22/22 135/77  04/05/22 (!) 104/56  04/02/22 126/64   Pulse Readings from Last 3 Encounters:  05/22/22 71  04/05/22 82  04/02/22 85   Wt Readings from Last 3 Encounters:  05/22/22 123 lb (55.8 kg)  04/05/22 134 lb (60.8 kg)  04/02/22 134 lb 4 oz (60.9 kg)   BMI Readings from Last 3 Encounters:  05/22/22 22.50 kg/m  04/05/22 24.51 kg/m  04/02/22 24.55 kg/m    Allergies  Allergen Reactions   Bee Venom Anaphylaxis,  Swelling and Other (See Comments)    Throat swelling   Mushroom Extract Complex Anaphylaxis    Penicillins Anaphylaxis, Hives and Swelling    Tolerates cephalosporins including cephalexin multiple times.  Has patient had a PCN reaction causing immediate rash, facial/tongue/throat swelling, SOB or lightheadedness with hypotension: Yes Has patient had a PCN reaction causing severe rash involving mucus membranes or skin necrosis: Yes Has patient had a PCN reaction that required hospitalization Yes Has patient had a PCN reaction occurring within the last 10 years: No     Codeine Nausea And Vomiting   Sulfa Antibiotics Nausea And Vomiting   Iohexol Itching and Swelling   Erythromycin Base Rash    Medications Reviewed Today     Reviewed by Ron Agee, CMA (Certified Medical Assistant) on 04/05/22 at [facility] List Status: <None>   Medication Order Taking? Sig Documenting Provider Last Dose Status Informant  albuterol (PROAIR HFA) 108 (90 Base) MCG/ACT inhaler [id number] Yes Inhale 2 puffs into the lungs every 6 (six) hours as needed for wheezing or shortness of breath. [name], MD Taking Active Self  apixaban (ELIQUIS) 5 MG TABS tablet [id number] Yes Take 1 tablet (5 mg total) by mouth 2 (two) times daily. [name], MD Taking Active   atorvastatin (LIPITOR) 40 MG tablet [id number] Yes TAKE ONE TABLET BY MOUTH EVERY Recardo Evangelist, MD Taking Active Self  cephALEXin (KEFLEX) 500 MG capsule [id number] Yes Take 1 capsule (500 mg total) by mouth 2 (two) times daily. [name], MD Taking Active   clopidogrel (PLAVIX) 75 MG tablet [id number] Yes Take 1 tablet (75 mg total) by mouth daily. Sueanne Margarita, MD Taking Active Self  dapagliflozin propanediol (FARXIGA) 5 MG TABS tablet [id number] Yes Take 1 tablet (5 mg total) by mouth daily before breakfast. [name], MD Taking Active   dorzolamide-timolol (COSOPT) 2-0.5 % ophthalmic solution [id number] Yes Place 1 drop into both eyes 2 (two) times daily. [provider] Taking Active Self   DULoxetine (CYMBALTA) 60 MG capsule [id number] Yes Take 1 capsule (60 mg total) by mouth daily. Lavina Hamman, MD Taking Active   furosemide (LASIX) 20 MG tablet [id number] Yes Take 1 tablet (20 mg total) by mouth daily as needed for fluid or edema (leg swelling). Arrien, Jimmy Picket, MD Taking Active Self  gabapentin (NEURONTIN) 300 MG capsule [id number] Yes TAKE ONE CAPSULE BY MOUTH TWICE DAILY [name], MD Taking Active Self  glipiZIDE (GLUCOTROL) 10 MG tablet [id number] Yes Take 1 tablet (10 mg total) by mouth 2 (two) times daily. [name], MD Taking Active Self  isosorbide mononitrate (IMDUR) 30 MG 24 hr tablet [id number] Yes TAKE 1/2 TABLET BY MOUTH ONCE DAILY . Sueanne Margarita, MD Taking Active Self  latanoprost (XALATAN) 0.005 % ophthalmic solution [id number] Yes Place 1 drop into both eyes at bedtime.  [provider] Taking Active Self  metFORMIN (GLUCOPHAGE-XR) 500 MG 24 hr tablet [id number] Yes TAKE TWO TABLETS BY MOUTH EVERY MORNING [name], MD Taking Active Self  metoprolol succinate (TOPROL-XL) 25 MG 24 hr tablet [id number] Yes Take 1.5 tablets (37.5 mg total) by mouth daily. Lavina Hamman, MD Taking Active   nitroGLYCERIN (NITROSTAT) 0.4 MG SL tablet [id number] Yes Dissolve 1 tab under tongue as needed for chest pain. May repeat every 5 minutes x 2 doses. If no relief call 9-1-1. Sueanne Margarita, MD Taking Active Self  pantoprazole (PROTONIX) 40 MG tablet [id number] Yes TAKE ONE TABLET BY MOUTH TWICE DAILY [name],  Garlon Hatchet, MD Taking Active Self            SDOH:  (Social Determinants of Health) assessments and interventions performed: {yes/no:20286} SDOH Interventions    Flowsheet Row Telephone from 03/13/2022 in [facility] from 10/25/2021 in [facility] at [place] Management from 06/14/2021 in [facility] at [place] from 06/06/2020 in [place] at [place] Management from 05/02/2020 in [place] at [facility] Management from 06/21/2019 in [facility] at [place] Interventions Intervention Not Indicated Intervention Not Indicated Intervention Not Indicated -- -- --  Housing Interventions Intervention Not Indicated Intervention Not Indicated -- -- -- --  Transportation Interventions Intervention Not Indicated Intervention Not Indicated -- -- -- --  Depression Interventions/Treatment  -- Currently on Treatment -- PHQ2-9 Score <4 Follow-up Not Indicated Medication --  Financial Strain Interventions -- Intervention Not Indicated Intervention Not Indicated -- Intervention Not Indicated  [Medications affordable] --  [Applying for low income subsidy]  Physical Activity Interventions -- Intervention Not Indicated -- -- -- --  Stress Interventions -- Intervention Not Indicated -- -- -- --  Social Connections Interventions -- Intervention Not Indicated -- -- -- --       Medication Assistance: {MEDASSISTANCEINFO:25044}  Medication Access: Within the past 30 days, how often has patient missed a dose of medication? *** Is a pillbox or other method used to improve adherence? {YES/NO:21197} Factors that may affect medication adherence? {CHL DESC; BARRIERS:21522} Are meds synced by current pharmacy? {YES/NO:21197} Are meds delivered by current pharmacy? {YES/NO:21197} Does patient experience delays in picking up medications due to transportation concerns? {YES/NO:21197}  [facility] Reviewed: Is patient disadvantaged to use [facility]?: {YES/NO:21197} Current Rx insurance plan: *** Name and location of Current pharmacy:  [facility] - [place], [place] - [address] [place] [address] Phone:  [phone] Fax: [phone]  [facility] services reviewed with patient today?: {YES/NO:21197} 30-day packs: delivery 05/27/22 Farxiga 5 mg - 1 tablet daily (1 breakfast)  Pantoprazole 40 mg - 1 tablet twice daily (1 breakfast, 1 evening meal) Gabapentin 300 mg- 1 tablet twice daily (1 breakfast, 1 evening meal) Duloxetine 60 mg - 1 capsule daily (1 breakfast) Atorvastatin 40 mg - 1 tablet daily (1 evening meal) Isosorbide Mononitrate 30 mg - 1/2 tablet daily (1/2 breakfast) Glipizide 10 mg - 1 tablet twice daily (1 breakfast, 1 evening meal) Metformin 500 mg ER - 2 tablets ( breakfast) Metoprolol succinate 25 mg ER - 1 tablet daily  (breakfast) Clopidogrel 75mg  - Take 1 tablet daily ( breakfast) Eliquis 5mg - take 1 breakfast- take 1 evening meal   Compliance/Adherence/Medication fill history: Care Gaps: Mammogram (due 03/2020) Foot exam (due 01/2022)  Star-Rating Drugs: Atorvastatin - PDC 100% Farxiga - PDC 100% Glipizide - PDC 100% Metformin - PDC 100%   ASSESSMENT / PLAN   Diabetes (A1c goal <7%) -Uncontrolled - A1c 9.2% (02/2022) worsening; pt does not check BG with glucometer and did not set up [employer]; she states "I'm never going to take insulin"; She does affirm taking her oral medications as prescribed. -Current home glucose readings - n/a (misplaced meter, no motivation to check BG) -She denies s/sx of hypoglycemia or hyperglycemia -Current medications: Farxiga 5 mg daily - Appropriate, Query Effective Metformin  500 mg ER - 2 tab AM- Appropriate, Query Effective Glipizide 10 mg BID- Appropriate, Query Effective Freestyle Libre - not using -Medications previously tried: Ozempic (never started; hx of N/V hesitant to use GLP1-RA), Lantus (never started - refuses insulin) -Current meal patterns: poor appetite, only eating once/day -Reviewed [name] is unlikely to be very helpful for DM given GFR < 45 (still helpful for HF/CKD) -Encouraged use of [employer] which she has supply, she declined to come into office for CGM training; discussed possibility of home visit for CGM training, pt will check with partner [name] and get back to me -Recommend to continue current medication   Heart Failure / HTN (Goal: BP < 130/80) -Controlled - pt is not using furosemide often -Last ejection fraction: 60-65% (Date: 06/2020) -HF type: HFpEF (EF > 50%) (Grade 1); NYHA Class: II (slight limitation of activity) -Follow with cardiology (Dr Radford Pax) -Current treatment: Farxiga 5 mg daily - Appropriate, Effective, Safe, Accessible Metoprolol succinate 25 mg daily -Appropriate, Effective, Safe, Accessible Furosemide 20 mg PRN  - Appropriate, Effective, Safe, Accessible Isosorbide MN 30 mg - 1/2 tab daily -Appropriate, Effective, Safe, Accessible -Medications previously tried: amlodipine, carvedilol, lisinopril (dizziness), losartan -Educated on Benefits of medications for managing symptoms and prolonging life -Recommended to continue current medication; advised to make appt with cardiology  Atrial Flutter (Goal: prevent stroke and major bleeding) -Controlled -CHADSVASC: 7; Aflutter occurred in s/o of acute PE/DVT 07/2019 -hx of DVT/presumed PE 07/2019, has remained on Eliquis + Plavix since then -Current treatment: Metoprolol succinate 25 mg daily - Appropriate, Effective, Safe, Accessible Eliquis 5 mg BID - Query Appropriate -Medications previously tried: amiodarone -Reviewed indication for Eliquis - acute DVT/PE treatment was planned for 6 months, then reassess but patient was lost to follow up; A-Flutter was also in s/o of acute PE/DVT, unclear if this is a chronic issue;  Hyperlipidemia/ CAD (LDL goal < 70) -Controlled - LDL 48 (10/2021) at goal -Hx CAD, CABGx3 -Current treatment: Atorvastatin 40 mg daily - Appropriate, Effective, Safe, Accessible Clopidogrel 75 mg daily - Appropriate, Effective, Safe, Accessible Isosorbide MN 30 mg - 1/2 tab daily  -Appropriate, Effective, Safe, Accessible -Medications previously tried: n/a  -Educated on Cholesterol goals;  -Recommended to continue current medication  Depression/Anxiety (Goal: Improve symptoms) -Improving  - She has had good and bad days -History of auditory hallucinations  -PHQ9:  4 (06/06/20) - minimal depression -Current treatment: Duloxetine 60 mg daily - Appropriate, Effective, Safe, Accessible -Medications previously tried/failed: sertraline 2015 (75 mg) --> changed to duloxetine 30, up to 60 mg (2017-2018) --> changed to citalopram 2018 - unknown efficacy. There is note in chart that duloxetine was not effective and thus changed to citalopram. But then later placed back on duloxetine after a psych admission along with trazodone and Geodon. -Declines interest in CBT in the past. Reports this was not effective. -Recommend to continue current medication   Chronic Kidney Disease Stage 3b  -All medications assessed for renal dosing and appropriateness in chronic kidney disease. -Recommended to continue current medication  Health Maintenance -Vaccine gaps: Shingrix, Prevnar20, RSV   ***

## 2022-05-29 NOTE — Telephone Encounter (Signed)
Care Management & Coordination Services Outreach Note  05/29/2022 Name: Maria Fox MRN: [id number] DOB: [birth year]  Referred by: [name], MD  Patient had a phone appointment scheduled with clinical pharmacist today.  An unsuccessful telephone outreach was attempted today. The patient was referred to the pharmacist for assistance with medications, care management and care coordination.   Patient will NOT be penalized in any way for missing a Care Management & Coordination Services appointment. The no-show fee does not apply.  If possible, a message was left to return call to: [phone] or to [facility].  Charlene Brooke, PharmD, BCACP Clinical Pharmacist [facility] at [facility] [phone]

## 2022-06-14 ENCOUNTER — Telehealth: Payer: Self-pay | Admitting: Pharmacist

## 2022-06-14 ENCOUNTER — Encounter: Payer: 59 | Admitting: Pharmacist

## 2022-06-14 DIAGNOSIS — S52502A Unspecified fracture of the lower end of left radius, initial encounter for closed fracture: Secondary | ICD-10-CM | POA: Diagnosis not present

## 2022-06-14 NOTE — Progress Notes (Unsigned)
Care Management & Coordination Services Pharmacy Note  06/14/2022 Name:  Maria Fox MRN:  [id number] DOB:  [birth year]  Summary: F/U visit -Pt endorses compliance with pill packs -DM: A1c 9.2% (02/2022),    Recommendations/Changes made from today's visit: -Consider home visit for CGM training- pt will call back if interested  Follow up plan: -Health Concierge will call patient *** -Pharmacist follow up televisit scheduled for *** -PCP appt 07/02/22; Cardiology appt 10/07/22     Subjective: Maria Fox is an 77 y.o. year old female who is a primary patient of Maria Boyden, MD.  The care coordination team was consulted for assistance with disease management and care coordination needs.    Engaged with patient by telephone for follow up visit.  Recent office visits: 04/02/22 Dr Sharen Hones OV: hospital f/u - advised to hold Farxiga until UTI sx resolve. Start Keflex for UTI. Low iron - start slow release iron supplement. Ucx - resistant to Keflex, switch to Cipro.  Recent consult visits: 04/05/22 Dr Mayford Knife (Cardiology): f/u CAD, Afib, HF. Reduce metoprolol to 25 mg.  Hospital visits: 05/22/22 ED visit ([facility]): fall, wrist fracture. CT head wnl.   02/23/22 - 03/12/22 Admission ([facility]): Flu/PNA - dehydration; persistent nausea in hospital; rec GI f/u outpatient. Marcelline Deist held d/t UTI.   Objective:  Lab Results  Component Value Date   CREATININE 1.36 (H) 04/02/2022   BUN 13 04/02/2022   GFR 37.17 (L) 04/02/2022   GFRNONAA 45 (L) 03/12/2022   GFRAA 41 (L) 01/28/2020   NA 137 04/02/2022   K 4.4 04/02/2022   CALCIUM 9.2 04/02/2022   CO2 24 04/02/2022   GLUCOSE 179 (H) 04/02/2022    Lab Results  Component Value Date/Time   HGBA1C 9.2 (H) 02/25/2022 03:55 AM   HGBA1C 9.5 (H) 10/30/2021 02:52 PM   FRUCTOSAMINE 302 (H) 11/10/2015 04:27 PM   GFR 37.17 (L) 04/02/2022 03:38 PM   GFR 32.12 (L) 10/30/2021 02:52 PM   MICROALBUR 1.7 10/30/2021 02:52 PM   MICROALBUR <0.7 05/02/2020  03:25 PM    Last diabetic Eye exam:  Lab Results  Component Value Date/Time   HMDIABEYEEXA Retinopathy (A) 01/02/2022 12:00 AM    Last diabetic Foot exam: No results found for: "HMDIABFOOTEX"   Lab Results  Component Value Date   CHOL 102 10/30/2021   HDL 34.40 (L) 10/30/2021   LDLCALC 40 05/02/2020   LDLDIRECT 48.0 10/30/2021   TRIG 280.0 (H) 10/30/2021   CHOLHDL 3 10/30/2021       Latest Ref Rng & Units 04/02/2022    3:38 PM 03/12/2022    3:19 AM 03/11/2022   10:33 AM  Hepatic Function  Total Protein 6.0 - 8.3 g/dL 7.2  6.2  5.9   Albumin 3.5 - 5.2 g/dL 4.2  3.0  3.1   AST 0 - 37 U/L 13  44  38   ALT 0 - 35 U/L 10  30  25    Alk Phosphatase 39 - 117 U/L 86  78  66   Total Bilirubin 0.2 - 1.2 mg/dL 0.3  0.6  0.4     Lab Results  Component Value Date/Time   TSH 1.187 07/15/2020 06:11 AM   TSH 1.247 07/10/2019 10:27 AM   TSH 2.64 05/02/2017 04:13 PM   TSH 3.457 07/08/2014 05:00 AM   FREET4 0.87 07/13/2014 11:18 AM       Latest Ref Rng & Units 04/02/2022    3:38 PM 03/12/2022    3:19 AM 03/11/2022   10:33  AM  CBC  WBC 4.0 - 10.5 K/uL 9.2  5.4  5.5   Hemoglobin 12.0 - 15.0 g/dL 92.4  26.8  34.1   Hematocrit 36.0 - 46.0 % 40.0  33.4  33.1   Platelets 150.0 - 400.0 K/uL 318.0  212  191     Lab Results  Component Value Date/Time   VD25OH 29.11 (L) 10/30/2021 02:52 PM   VD25OH 32.36 05/02/2020 03:25 PM   VITAMINB12 759 10/30/2021 02:52 PM   VITAMINB12 397 01/26/2021 04:25 PM    Clinical ASCVD: Yes  The ASCVD Risk score (Arnett DK, et al., 2019) failed to calculate for the following reasons:   The patient has a prior MI or stroke diagnosis    CHA2DS2/VAS Stroke Risk Points  Current as of 3 hours ago     9 >= 2 Points: High Risk    Points Metrics  1 Has Congestive Heart Failure:  Yes    Current as of 3 hours ago  1 Has Vascular Disease:  Yes    Current as of 3 hours ago  1 Has Hypertension:  Yes    Current as of 3 hours ago  2 Age:  77    Current as of 3 hours  ago  1 Has Diabetes:  Yes    Current as of 3 hours ago  2 Had Stroke:  Yes  Had TIA:  No  Had Thromboembolism:  No    Current as of 3 hours ago  1 Female:  Yes    Current as of 3 hours ago       04/02/2022    3:02 PM 10/25/2021   11:16 AM 06/06/2020    3:38 PM  Depression screen PHQ 2/9  Decreased Interest 0 3 2  Down, Depressed, Hopeless 0 2 2  PHQ - 2 Score 0 5 4  Altered sleeping  0 0  Tired, decreased energy  0 0  Change in appetite  0 0  Feeling bad or failure about yourself   0 0  Trouble concentrating  0 0  Moving slowly or fidgety/restless  0 0  Suicidal thoughts  0 0  PHQ-9 Score  5 4  Difficult doing work/chores  Somewhat difficult Not difficult at all     Social History   Tobacco Use  Smoking Status Never  Smokeless Tobacco Never   BP Readings from Last 3 Encounters:  05/22/22 135/77  04/05/22 (!) 104/56  04/02/22 126/64   Pulse Readings from Last 3 Encounters:  05/22/22 71  04/05/22 82  04/02/22 85   Wt Readings from Last 3 Encounters:  05/22/22 123 lb (55.8 kg)  04/05/22 134 lb (60.8 kg)  04/02/22 134 lb 4 oz (60.9 kg)   BMI Readings from Last 3 Encounters:  05/22/22 22.50 kg/m  04/05/22 24.51 kg/m  04/02/22 24.55 kg/m    Allergies  Allergen Reactions   Bee Venom Anaphylaxis, Swelling and Other (See Comments)    Throat swelling   Mushroom Extract Complex Anaphylaxis   Penicillins Anaphylaxis, Hives and Swelling    Tolerates cephalosporins including cephalexin multiple times.  Has patient had a PCN reaction causing immediate rash, facial/tongue/throat swelling, SOB or lightheadedness with hypotension: Yes Has patient had a PCN reaction causing severe rash involving mucus membranes or skin necrosis: Yes Has patient had a PCN reaction that required hospitalization Yes Has patient had a PCN reaction occurring within the last 10 years: No     Codeine Nausea And Vomiting   Sulfa Antibiotics  Nausea And Vomiting   Iohexol Itching and  Swelling   Erythromycin Base Rash    Medications Reviewed Today     Reviewed by Kem Kaysox, Candice A, CMA (Certified Medical Assistant) on 04/05/22 at 1457  Med List Status: <None>   Medication Order Taking? Sig Documenting Provider Last Dose Status Informant  albuterol (PROAIR HFA) 108 (90 Base) MCG/ACT inhaler [id number] Yes Inhale 2 puffs into the lungs every 6 (six) hours as needed for wheezing or shortness of breath. Maria BoydenGutierrez, [name], MD Taking Active Self  apixaban (ELIQUIS) 5 MG TABS tablet [id number] Yes Take 1 tablet (5 mg total) by mouth 2 (two) times daily. Maria BoydenGutierrez, [name], MD Taking Active   atorvastatin (LIPITOR) 40 MG tablet [id number] Yes TAKE ONE TABLET BY MOUTH EVERY Onalee HuaEVENING Gutierrez, [name], MD Taking Active Self  cephALEXin (KEFLEX) 500 MG capsule [id number] Yes Take 1 capsule (500 mg total) by mouth 2 (two) times daily. Maria BoydenGutierrez, [name], MD Taking Active   clopidogrel (PLAVIX) 75 MG tablet [id number] Yes Take 1 tablet (75 mg total) by mouth daily. Quintella Reicherturner, Traci R, MD Taking Active Self  dapagliflozin propanediol (FARXIGA) 5 MG TABS tablet [id number] Yes Take 1 tablet (5 mg total) by mouth daily before breakfast. Maria BoydenGutierrez, [name], MD Taking Active   dorzolamide-timolol (COSOPT) 2-0.5 % ophthalmic solution [id number] Yes Place 1 drop into both eyes 2 (two) times daily. [provider] Taking Active Self  DULoxetine (CYMBALTA) 60 MG capsule [id number] Yes Take 1 capsule (60 mg total) by mouth daily. Rolly SalterPatel, Pranav M, MD Taking Active   furosemide (LASIX) 20 MG tablet [id number] Yes Take 1 tablet (20 mg total) by mouth daily as needed for fluid or edema (leg swelling). Arrien, York RamMauricio Daniel, MD Taking Active Self  gabapentin (NEURONTIN) 300 MG capsule [id number] Yes TAKE ONE CAPSULE BY MOUTH TWICE DAILY Maria BoydenGutierrez, [name], MD Taking Active Self  glipiZIDE (GLUCOTROL) 10 MG tablet [id number] Yes Take 1 tablet (10 mg total) by mouth 2 (two) times daily. Maria BoydenGutierrez, [name], MD Taking  Active Self  isosorbide mononitrate (IMDUR) 30 MG 24 hr tablet [id number] Yes TAKE 1/2 TABLET BY MOUTH ONCE DAILY . Quintella Reicherturner, Traci R, MD Taking Active Self  latanoprost (XALATAN) 0.005 % ophthalmic solution [id number] Yes Place 1 drop into both eyes at bedtime.  [provider] Taking Active Self  metFORMIN (GLUCOPHAGE-XR) 500 MG 24 hr tablet [id number] Yes TAKE TWO TABLETS BY MOUTH EVERY MORNING Maria BoydenGutierrez, [name], MD Taking Active Self  metoprolol succinate (TOPROL-XL) 25 MG 24 hr tablet [id number] Yes Take 1.5 tablets (37.5 mg total) by mouth daily. Rolly SalterPatel, Pranav M, MD Taking Active   nitroGLYCERIN (NITROSTAT) 0.4 MG SL tablet [id number] Yes Dissolve 1 tab under tongue as needed for chest pain. May repeat every 5 minutes x 2 doses. If no relief call 9-1-1. Quintella Reicherturner, Traci R, MD Taking Active Self  pantoprazole (PROTONIX) 40 MG tablet [id number] Yes TAKE ONE TABLET BY MOUTH TWICE DAILY Maria BoydenGutierrez, [name], MD Taking Active Self            SDOH:  (Social Determinants of Health) assessments and interventions performed: {yes/no:20286} SDOH Interventions    Flowsheet Row Telephone from 03/13/2022 in [facility] Community Care Coordination Clinical Support from [facility] from [facility]/06[facility]2020 [facility] from [facility]/02[facility]  SDOH Interventions        Food Insecurity Interventions Intervention Not Indicated Intervention Not Indicated Intervention Not Indicated -- -- --  Housing Interventions Intervention Not Indicated Intervention Not Indicated -- -- -- --  Transportation Interventions Intervention Not Indicated Intervention Not Indicated -- -- -- --   Depression Interventions/Treatment  -- Currently on Treatment -- PHQ2-9 Score <4 Follow-up Not Indicated Medication --  Financial Strain Interventions -- Intervention Not Indicated Intervention Not Indicated -- Intervention Not Indicated  [Medications affordable] --  [Applying for low income subsidy]  Physical Activity Interventions -- Intervention Not Indicated -- -- -- --  Stress Interventions -- Intervention Not Indicated -- -- -- --  Social Connections Interventions -- Intervention Not Indicated -- -- -- --       Medication Assistance: None required.  Patient affirms current coverage meets needs. - LIS  Medication Access: Within the past 30 days, how often has patient missed a dose of medication? *** Is a pillbox or other method used to improve adherence? {YES/NO:21197} Factors that may affect medication adherence? {CHL DESC; BARRIERS:21522} Are meds synced by current pharmacy? {YES/NO:21197} Are meds delivered by current pharmacy? {YES/NO:21197} Does patient experience delays in picking up medications due to transportation concerns? {YES/NO:21197}  [facility] Reviewed: Is patient disadvantaged to use [facility]?: {YES/NO:21197} Current Rx insurance plan: *** Name and location of Current pharmacy:  [facility] [place] [address] [place] [address] Phone: [phone] Fax: [phone]  [facility] services reviewed with patient today?: {YES/NO:21197} 30-day packs: delivery 05/27/22 Farxiga 5 mg - 1 tablet daily (1 breakfast)  Pantoprazole 40 mg - 1 tablet twice daily (1 breakfast, 1 evening meal) Gabapentin 300 mg- 1 tablet twice daily (1 breakfast, 1 evening meal) Duloxetine 60 mg - 1 capsule daily (1 breakfast) Atorvastatin 40 mg - 1 tablet daily (1 evening meal) Isosorbide Mononitrate 30 mg - 1/2 tablet daily (1/2 breakfast) Glipizide 10 mg - 1 tablet twice daily (1 breakfast, 1  evening meal) Metformin 500 mg ER - 2 tablets ( breakfast) Metoprolol succinate 25 mg ER - 1 tablet daily  (breakfast) Clopidogrel 75mg  - Take 1 tablet daily ( breakfast) Eliquis 5mg - take 1 breakfast- take 1 evening meal   Compliance/Adherence/Medication fill history: Care Gaps: Mammogram (due 03/2020) - call [phone] - breast center [facility] Foot exam (due 01/2022)  Star-Rating Drugs: Atorvastatin - PDC 100% Farxiga - PDC 100% Glipizide - PDC 100% Metformin - PDC 100%   ASSESSMENT / PLAN   Diabetes (A1c goal <7%) -Uncontrolled - A1c 9.2% (02/2022) worsening; pt does not check BG with glucometer and did not set up [employer]; she states "I'm never going to take insulin"; She does affirm taking her oral medications as prescribed. -Current home glucose readings - n/a (misplaced meter, no motivation to check BG) -She denies s/sx of hypoglycemia or hyperglycemia -Current medications: Farxiga 5 mg daily - Appropriate, Query Effective Metformin XR 500 mg- 2 tab AM- Appropriate, Query Effective Glipizide 10 mg BID- Appropriate, Query Effective Freestyle Libre - not using -Medications previously tried: Ozempic (never started; hx of N/V hesitant to use GLP1-RA), Lantus (never started - refuses insulin) -Current meal patterns: poor appetite, only eating once/day -Reviewed Marcelline DeistFarxiga is unlikely to be very helpful for DM given GFR < 45 (still helpful for HF/CKD) -Encouraged use of Freestyle Libre which she has supply, she declined to come into office for CGM  training; discussed possibility of home visit for CGM training, pt will check with partner [name] and get back to me -Recommend to continue current medication   Heart Failure / HTN (Goal: BP < 130/80) -Controlled - pt is not using furosemide often -Last ejection fraction: 60-65% (Date: 06/2020) -HF type: HFpEF (EF > 50%) (Grade 1); NYHA Class: II (slight limitation of activity) -Follow with cardiology (Dr Mayford Knife) -Current  treatment: Farxiga 5 mg daily - Appropriate, Effective, Safe, Accessible Metoprolol succinate 25 mg daily -Appropriate, Effective, Safe, Accessible Furosemide 20 mg PRN  - Appropriate, Effective, Safe, Accessible Isosorbide MN 30 mg - 1/2 tab daily -Appropriate, Effective, Safe, Accessible -Medications previously tried: amlodipine, carvedilol, lisinopril (dizziness), losartan -Educated on Benefits of medications for managing symptoms and prolonging life -Recommended to continue current medication; advised to make appt with cardiology  Atrial Flutter (Goal: prevent stroke and major bleeding) -Controlled -CHADSVASC: 7; Aflutter occurred in s/o of acute PE/DVT 07/2019 -hx of DVT/presumed PE 07/2019, has remained on Eliquis + Plavix since then -Current treatment: Metoprolol succinate 25 mg daily - Appropriate, Effective, Safe, Accessible Eliquis 5 mg BID - Query Appropriate -Medications previously tried: amiodarone -Reviewed indication for Eliquis - acute DVT/PE treatment was planned for 6 months, then reassess but patient was lost to follow up; A-Flutter was also in s/o of acute PE/DVT, unclear if this is a chronic issue;  Hyperlipidemia/ CAD (LDL goal < 70) -Controlled - LDL 48 (10/2021) at goal -Hx CAD, CABGx3 -Current treatment: Atorvastatin 40 mg daily - Appropriate, Effective, Safe, Accessible Clopidogrel 75 mg daily - Appropriate, Effective, Safe, Accessible Isosorbide MN 30 mg - 1/2 tab daily -Appropriate, Effective, Safe, Accessible -Medications previously tried: n/a  -Educated on Cholesterol goals;  -Recommended to continue current medication  Depression/Anxiety (Goal: Improve symptoms) -Improving  - She has had good and bad days -History of auditory hallucinations  -PHQ9:  4 (06/06/20) - minimal depression -Current treatment: Duloxetine 60 mg daily - Appropriate, Effective, Safe, Accessible -Medications previously tried/failed: sertraline 2015 (75 mg) --> changed to duloxetine  30, up to 60 mg (2017-2018) --> changed to citalopram 2018 - unknown efficacy. There is note in chart that duloxetine was not effective and thus changed to citalopram. But then later placed back on duloxetine after a psych admission along with trazodone and Geodon. -Declines interest in CBT in the past. Reports this was not effective. -Recommend to continue current medication   Chronic Kidney Disease Stage 3b  -All medications assessed for renal dosing and appropriateness in chronic kidney disease. -Recommended to continue current medication  Health Maintenance -Vaccine gaps: Shingrix, Prevnar20, RSV   ***

## 2022-06-14 NOTE — Telephone Encounter (Signed)
Care Management & Coordination Services Outreach Note  06/14/2022 Name: Maria Fox MRN: [id number] DOB: Feb 15, 1946  Referred by: Eustaquio Boyden, MD  Patient had a phone appointment scheduled with clinical pharmacist today.  An unsuccessful telephone outreach was attempted today. The patient was referred to the pharmacist for assistance with medications, care management and care coordination.   Patient will NOT be penalized in any way for missing a Care Management & Coordination Services appointment. The no-show fee does not apply.  If possible, a message was left to return call to: [phone] or to [facility].  Al Corpus, PharmD, BCACP Clinical Pharmacist [facility] [phone]

## 2022-06-17 ENCOUNTER — Telehealth: Payer: Self-pay

## 2022-06-17 NOTE — Progress Notes (Unsigned)
Care Management & Coordination Services Pharmacy Team  Reason for Encounter: Medication coordination and delivery  Contacted patient to discuss medications and coordinate delivery from [facility]. {US HC Outreach:28874} Cycle dispensing form sent to *** for review.   Last adherence delivery date:***      Patient is due for next adherence delivery on: 06/26/22  This delivery to include: Adherence Packaging  30 Days  Farxiga 5 mg - 1 tablet daily (1 breakfast)  Pantoprazole 40 mg - 1 tablet twice daily (1 breakfast, 1 evening meal) Gabapentin 300 mg- 1 tablet twice daily (1 breakfast, 1 evening meal) Duloxetine 30 mg - 1 capsule daily (1 breakfast) Atorvastatin 40 mg - 1 tablet daily (1 evening meal) Isosorbide Mononitrate 30 mg - 1/2 tablet daily (1/2 breakfast) Glipizide 10 mg - 1 tablet twice daily (1 breakfast, 1 evening meal) Metformin 500 mg ER - 2 tablets ( breakfast) Metoprolol succinate 25 mg ER - 1 tablet daily  (breakfast) Clopidogrel 75mg  - Take 1 tablet daily ( breakfast) Eliquis 5mg - take 1 breakfast- take 1 evening meal   Patient declined the following medications this month: ***  {refills needed:25320}  {Delivery VJKQ:20601}   Any concerns about your medications? {yes/no:20286}  How often do you forget or accidentally miss a dose? {Missed doses:25554}  Do you use a pillbox? {yes/no:20286}  Is patient in packaging {yes/no:20286}  If yes  What is the date on your next pill pack?  Any concerns or issues with your packaging?   Recent blood pressure readings are as follows:***  Recent blood glucose readings are as follows:***   Chart review: Recent office visits:  ***  Recent consult visits:  ***  Hospital visits:  {Hospital DC Yes/No:21091515}  Medications: Outpatient Encounter Medications as of 06/17/2022  Medication Sig   albuterol (PROAIR HFA) 108 (90 Base) MCG/ACT inhaler Inhale 2 puffs into the lungs every 6 (six) hours as needed for  wheezing or shortness of breath.   apixaban (ELIQUIS) 5 MG TABS tablet TAKE ONE TABLET BY MOUTH TWICE DAILY   atorvastatin (LIPITOR) 40 MG tablet TAKE ONE TABLET BY MOUTH EVERY EVENING   ciprofloxacin (CIPRO) 250 MG tablet Take 1 tablet (250 mg total) by mouth 2 (two) times daily.   clopidogrel (PLAVIX) 75 MG tablet Take 1 tablet (75 mg total) by mouth daily.   dapagliflozin propanediol (FARXIGA) 5 MG TABS tablet Take 1 tablet (5 mg total) by mouth daily before breakfast.   dorzolamide-timolol (COSOPT) 2-0.5 % ophthalmic solution Place 1 drop into both eyes 2 (two) times daily.   DULoxetine (CYMBALTA) 60 MG capsule Take 1 capsule (60 mg total) by mouth daily.   furosemide (LASIX) 20 MG tablet Take 1 tablet (20 mg total) by mouth daily as needed for fluid or edema (leg swelling).   gabapentin (NEURONTIN) 300 MG capsule TAKE ONE CAPSULE BY MOUTH TWICE DAILY   glipiZIDE (GLUCOTROL) 10 MG tablet Take 1 tablet (10 mg total) by mouth 2 (two) times daily.   isosorbide mononitrate (IMDUR) 30 MG 24 hr tablet TAKE 1/2 TABLET BY MOUTH ONCE DAILY   latanoprost (XALATAN) 0.005 % ophthalmic solution Place 1 drop into both eyes at bedtime.    metFORMIN (GLUCOPHAGE-XR) 500 MG 24 hr tablet TAKE TWO TABLETS BY MOUTH EVERY MORNING   metoprolol succinate (TOPROL XL) 25 MG 24 hr tablet Take 1 tablet (25 mg total) by mouth daily.   nitroGLYCERIN (NITROSTAT) 0.4 MG SL tablet Dissolve 1 tab under tongue as needed for chest pain. May repeat every  5 minutes x 2 doses. If no relief call 9-1-1.   pantoprazole (PROTONIX) 40 MG tablet TAKE ONE TABLET BY MOUTH TWICE DAILY   No facility-administered encounter medications on file as of 06/17/2022.   BP Readings from Last 3 Encounters:  05/22/22 135/77  04/05/22 (!) 104/56  04/02/22 126/64    Pulse Readings from Last 3 Encounters:  05/22/22 71  04/05/22 82  04/02/22 85    Lab Results  Component Value Date/Time   HGBA1C 9.2 (H) 02/25/2022 03:55 AM   HGBA1C 9.5 (H)  10/30/2021 02:52 PM   Lab Results  Component Value Date   CREATININE 1.36 (H) 04/02/2022   BUN 13 04/02/2022   GFR 37.17 (L) 04/02/2022   GFRNONAA 45 (L) 03/12/2022   GFRAA 41 (L) 01/28/2020   NA 137 04/02/2022   K 4.4 04/02/2022   CALCIUM 9.2 04/02/2022   CO2 24 04/02/2022     Al Corpus, PharmD notified  Burt Knack, [facility] Clinical Pharmacy Assistant [phone]

## 2022-06-20 ENCOUNTER — Telehealth: Payer: Self-pay

## 2022-06-20 NOTE — Progress Notes (Signed)
Care Management & Coordination Services Pharmacy Team  Reason for Encounter: Medication refills   Spoke with patient on 06/20/2022   Patient contacted me and gave me her new phone number .Patient is asking again for a refill on her dorz/timol eye drops. I contacted Dr.Groats office for her and requested refill to be sent to [facility].Patient also informed me that she is on tramadol for recent wrist fracture and would like a refill. Orthopedic office contacted for the refill to be sent to [facility].  Al Corpus, PharmD notified  Burt Knack, [facility] Clinical Pharmacy Assistant [phone]

## 2022-06-20 NOTE — Progress Notes (Signed)
Entered in error

## 2022-07-02 ENCOUNTER — Ambulatory Visit: Payer: 59 | Admitting: Family Medicine

## 2022-07-02 DIAGNOSIS — E1149 Type 2 diabetes mellitus with other diabetic neurological complication: Secondary | ICD-10-CM

## 2022-07-03 ENCOUNTER — Encounter: Payer: Self-pay | Admitting: Family Medicine

## 2022-07-08 DIAGNOSIS — M25532 Pain in left wrist: Secondary | ICD-10-CM | POA: Diagnosis not present

## 2022-07-08 DIAGNOSIS — S52502A Unspecified fracture of the lower end of left radius, initial encounter for closed fracture: Secondary | ICD-10-CM | POA: Diagnosis not present

## 2022-07-10 ENCOUNTER — Telehealth: Payer: Self-pay | Admitting: *Deleted

## 2022-07-10 NOTE — Progress Notes (Signed)
  Care Coordination  Outreach Note  07/10/2022 Name: MARCELLINA JONSSON MRN: [id number] DOB: Oct 17, 1945   Care Coordination Outreach Attempts: An unsuccessful telephone outreach was attempted today to offer the patient information about available care coordination services.  Follow Up Plan:  Additional outreach attempts will be made to offer the patient care coordination information and services.   Encounter Outcome:  No Answer  Burman Nieves, CCMA Care Coordination Care Guide Direct Dial: [phone]

## 2022-07-16 ENCOUNTER — Telehealth: Payer: Self-pay

## 2022-07-16 NOTE — Progress Notes (Signed)
Care Management & Coordination Services Pharmacy Team  Reason for Encounter: Medication coordination and delivery  Contacted patient to discuss medications and coordinate delivery from [facility]. {US HC Outreach:28874} Cycle dispensing form sent to *** for review.   Last adherence delivery date:06/26/22      Patient is due for next adherence delivery on: 07/25/22  This delivery to include: Adherence Packaging  30 Days  Farxiga 5 mg - 1 tablet daily (1 breakfast)  Pantoprazole 40 mg - 1 tablet twice daily (1 breakfast, 1 evening meal) Gabapentin 300 mg- 1 tablet twice daily (1 breakfast, 1 evening meal) Duloxetine 30 mg - 1 capsule daily (1 breakfast) Atorvastatin 40 mg - 1 tablet daily (1 evening meal) Isosorbide Mononitrate 30 mg - 1/2 tablet daily (1/2 breakfast) Glipizide 10 mg - 1 tablet twice daily (1 breakfast, 1 evening meal) Metformin 500 mg ER - 2 tablets ( breakfast) Metoprolol succinate 25 mg ER - 1 tablet daily  (breakfast) Clopidogrel 75mg  - Take 1 tablet daily ( breakfast) Eliquis 5mg - take 1 breakfast- take 1 evening meal   Patient declined the following medications this month: ***  Refills requested from providers include: tramadol  {Delivery date:25786}   Any concerns about your medications? {yes/no:20286}  How often do you forget or accidentally miss a dose? {Missed doses:25554}  Do you use a pillbox? No  Is patient in packaging Yes  What is the date on your next pill pack?  Any concerns or issues with your packaging?   Recent blood pressure readings are as follows:none available   Recent blood glucose readings are as follows:none available   Chart review: Recent office visits:  ***  Recent consult visits:  ***  Hospital visits:  {Hospital DC Yes/No:21091515}  Medications: Outpatient Encounter Medications as of 07/16/2022  Medication Sig   albuterol (PROAIR HFA) 108 (90 Base) MCG/ACT inhaler Inhale 2 puffs into the lungs every 6  (six) hours as needed for wheezing or shortness of breath.   apixaban (ELIQUIS) 5 MG TABS tablet TAKE ONE TABLET BY MOUTH TWICE DAILY   atorvastatin (LIPITOR) 40 MG tablet TAKE ONE TABLET BY MOUTH EVERY EVENING   ciprofloxacin (CIPRO) 250 MG tablet Take 1 tablet (250 mg total) by mouth 2 (two) times daily.   clopidogrel (PLAVIX) 75 MG tablet Take 1 tablet (75 mg total) by mouth daily.   dapagliflozin propanediol (FARXIGA) 5 MG TABS tablet Take 1 tablet (5 mg total) by mouth daily before breakfast.   dorzolamide-timolol (COSOPT) 2-0.5 % ophthalmic solution Place 1 drop into both eyes 2 (two) times daily.   DULoxetine (CYMBALTA) 60 MG capsule Take 1 capsule (60 mg total) by mouth daily.   furosemide (LASIX) 20 MG tablet Take 1 tablet (20 mg total) by mouth daily as needed for fluid or edema (leg swelling).   gabapentin (NEURONTIN) 300 MG capsule TAKE ONE CAPSULE BY MOUTH TWICE DAILY   glipiZIDE (GLUCOTROL) 10 MG tablet Take 1 tablet (10 mg total) by mouth 2 (two) times daily.   isosorbide mononitrate (IMDUR) 30 MG 24 hr tablet TAKE 1/2 TABLET BY MOUTH ONCE DAILY   latanoprost (XALATAN) 0.005 % ophthalmic solution Place 1 drop into both eyes at bedtime.    metFORMIN (GLUCOPHAGE-XR) 500 MG 24 hr tablet TAKE TWO TABLETS BY MOUTH EVERY MORNING   metoprolol succinate (TOPROL XL) 25 MG 24 hr tablet Take 1 tablet (25 mg total) by mouth daily.   nitroGLYCERIN (NITROSTAT) 0.4 MG SL tablet Dissolve 1 tab under tongue as needed for chest  pain. May repeat every 5 minutes x 2 doses. If no relief call 9-1-1.   pantoprazole (PROTONIX) 40 MG tablet TAKE ONE TABLET BY MOUTH TWICE DAILY   No facility-administered encounter medications on file as of 07/16/2022.   BP Readings from Last 3 Encounters:  05/22/22 135/77  04/05/22 (!) 104/56  04/02/22 126/64    Pulse Readings from Last 3 Encounters:  05/22/22 71  04/05/22 82  04/02/22 85    Lab Results  Component Value Date/Time   HGBA1C 9.2 (H) 02/25/2022 03:55  AM   HGBA1C 9.5 (H) 10/30/2021 02:52 PM   Lab Results  Component Value Date   CREATININE 1.36 (H) 04/02/2022   BUN 13 04/02/2022   GFR 37.17 (L) 04/02/2022   GFRNONAA 45 (L) 03/12/2022   GFRAA 41 (L) 01/28/2020   NA 137 04/02/2022   K 4.4 04/02/2022   CALCIUM 9.2 04/02/2022   CO2 24 04/02/2022     Al Corpus, PharmD notified  Burt Knack, [facility] Clinical Pharmacy Assistant [phone]

## 2022-07-21 ENCOUNTER — Emergency Department (HOSPITAL_COMMUNITY): Payer: 59

## 2022-07-21 ENCOUNTER — Inpatient Hospital Stay (HOSPITAL_COMMUNITY)
Admission: EM | Admit: 2022-07-21 | Discharge: 2022-07-29 | DRG: 690 | Disposition: A | Discharge status: Home / Self Care | Payer: 59 | Attending: Internal Medicine | Admitting: Internal Medicine

## 2022-07-21 ENCOUNTER — Encounter (HOSPITAL_COMMUNITY): Payer: Self-pay

## 2022-07-21 ENCOUNTER — Other Ambulatory Visit: Payer: Self-pay

## 2022-07-21 DIAGNOSIS — Z880 Allergy status to penicillin: Secondary | ICD-10-CM | POA: Diagnosis not present

## 2022-07-21 DIAGNOSIS — E1169 Type 2 diabetes mellitus with other specified complication: Secondary | ICD-10-CM | POA: Diagnosis present

## 2022-07-21 DIAGNOSIS — I4892 Unspecified atrial flutter: Secondary | ICD-10-CM | POA: Diagnosis not present

## 2022-07-21 DIAGNOSIS — Z8249 Family history of ischemic heart disease and other diseases of the circulatory system: Secondary | ICD-10-CM | POA: Diagnosis not present

## 2022-07-21 DIAGNOSIS — N3000 Acute cystitis without hematuria: Secondary | ICD-10-CM

## 2022-07-21 DIAGNOSIS — Z8049 Family history of malignant neoplasm of other genital organs: Secondary | ICD-10-CM

## 2022-07-21 DIAGNOSIS — R319 Hematuria, unspecified: Secondary | ICD-10-CM | POA: Diagnosis not present

## 2022-07-21 DIAGNOSIS — Z86718 Personal history of other venous thrombosis and embolism: Secondary | ICD-10-CM | POA: Diagnosis not present

## 2022-07-21 DIAGNOSIS — Z955 Presence of coronary angioplasty implant and graft: Secondary | ICD-10-CM

## 2022-07-21 DIAGNOSIS — R197 Diarrhea, unspecified: Secondary | ICD-10-CM | POA: Diagnosis present

## 2022-07-21 DIAGNOSIS — E1143 Type 2 diabetes mellitus with diabetic autonomic (poly)neuropathy: Secondary | ICD-10-CM | POA: Diagnosis not present

## 2022-07-21 DIAGNOSIS — I2510 Atherosclerotic heart disease of native coronary artery without angina pectoris: Secondary | ICD-10-CM | POA: Diagnosis present

## 2022-07-21 DIAGNOSIS — Z79899 Other long term (current) drug therapy: Secondary | ICD-10-CM

## 2022-07-21 DIAGNOSIS — Z881 Allergy status to other antibiotic agents status: Secondary | ICD-10-CM

## 2022-07-21 DIAGNOSIS — I5032 Chronic diastolic (congestive) heart failure: Secondary | ICD-10-CM | POA: Diagnosis present

## 2022-07-21 DIAGNOSIS — E1122 Type 2 diabetes mellitus with diabetic chronic kidney disease: Secondary | ICD-10-CM | POA: Diagnosis not present

## 2022-07-21 DIAGNOSIS — N3001 Acute cystitis with hematuria: Secondary | ICD-10-CM | POA: Diagnosis not present

## 2022-07-21 DIAGNOSIS — N179 Acute kidney failure, unspecified: Secondary | ICD-10-CM | POA: Diagnosis not present

## 2022-07-21 DIAGNOSIS — Z882 Allergy status to sulfonamides status: Secondary | ICD-10-CM

## 2022-07-21 DIAGNOSIS — Z800 Family history of malignant neoplasm of digestive organs: Secondary | ICD-10-CM

## 2022-07-21 DIAGNOSIS — Z8616 Personal history of COVID-19: Secondary | ICD-10-CM | POA: Diagnosis not present

## 2022-07-21 DIAGNOSIS — Z743 Need for continuous supervision: Secondary | ICD-10-CM | POA: Diagnosis not present

## 2022-07-21 DIAGNOSIS — Z8673 Personal history of transient ischemic attack (TIA), and cerebral infarction without residual deficits: Secondary | ICD-10-CM

## 2022-07-21 DIAGNOSIS — B3731 Acute candidiasis of vulva and vagina: Secondary | ICD-10-CM | POA: Diagnosis present

## 2022-07-21 DIAGNOSIS — I480 Paroxysmal atrial fibrillation: Secondary | ICD-10-CM | POA: Diagnosis not present

## 2022-07-21 DIAGNOSIS — Z808 Family history of malignant neoplasm of other organs or systems: Secondary | ICD-10-CM

## 2022-07-21 DIAGNOSIS — Z885 Allergy status to narcotic agent status: Secondary | ICD-10-CM

## 2022-07-21 DIAGNOSIS — T3695XA Adverse effect of unspecified systemic antibiotic, initial encounter: Secondary | ICD-10-CM | POA: Diagnosis present

## 2022-07-21 DIAGNOSIS — N390 Urinary tract infection, site not specified: Principal | ICD-10-CM | POA: Diagnosis present

## 2022-07-21 DIAGNOSIS — E860 Dehydration: Secondary | ICD-10-CM | POA: Diagnosis not present

## 2022-07-21 DIAGNOSIS — R310 Gross hematuria: Secondary | ICD-10-CM

## 2022-07-21 DIAGNOSIS — Z83438 Family history of other disorder of lipoprotein metabolism and other lipidemia: Secondary | ICD-10-CM

## 2022-07-21 DIAGNOSIS — E785 Hyperlipidemia, unspecified: Secondary | ICD-10-CM | POA: Diagnosis not present

## 2022-07-21 DIAGNOSIS — Z7901 Long term (current) use of anticoagulants: Secondary | ICD-10-CM | POA: Diagnosis not present

## 2022-07-21 DIAGNOSIS — Z951 Presence of aortocoronary bypass graft: Secondary | ICD-10-CM | POA: Diagnosis not present

## 2022-07-21 DIAGNOSIS — N1832 Chronic kidney disease, stage 3b: Secondary | ICD-10-CM | POA: Diagnosis not present

## 2022-07-21 DIAGNOSIS — R739 Hyperglycemia, unspecified: Secondary | ICD-10-CM | POA: Diagnosis not present

## 2022-07-21 DIAGNOSIS — Z91030 Bee allergy status: Secondary | ICD-10-CM

## 2022-07-21 DIAGNOSIS — E1149 Type 2 diabetes mellitus with other diabetic neurological complication: Secondary | ICD-10-CM | POA: Diagnosis present

## 2022-07-21 DIAGNOSIS — Z7902 Long term (current) use of antithrombotics/antiplatelets: Secondary | ICD-10-CM

## 2022-07-21 DIAGNOSIS — R110 Nausea: Secondary | ICD-10-CM | POA: Diagnosis not present

## 2022-07-21 DIAGNOSIS — Z825 Family history of asthma and other chronic lower respiratory diseases: Secondary | ICD-10-CM

## 2022-07-21 DIAGNOSIS — I130 Hypertensive heart and chronic kidney disease with heart failure and stage 1 through stage 4 chronic kidney disease, or unspecified chronic kidney disease: Secondary | ICD-10-CM | POA: Diagnosis not present

## 2022-07-21 DIAGNOSIS — K219 Gastro-esophageal reflux disease without esophagitis: Secondary | ICD-10-CM | POA: Diagnosis present

## 2022-07-21 DIAGNOSIS — N1831 Chronic kidney disease, stage 3a: Secondary | ICD-10-CM | POA: Diagnosis present

## 2022-07-21 DIAGNOSIS — Z833 Family history of diabetes mellitus: Secondary | ICD-10-CM

## 2022-07-21 DIAGNOSIS — N183 Chronic kidney disease, stage 3 unspecified: Secondary | ICD-10-CM | POA: Diagnosis present

## 2022-07-21 DIAGNOSIS — E1165 Type 2 diabetes mellitus with hyperglycemia: Secondary | ICD-10-CM | POA: Diagnosis present

## 2022-07-21 DIAGNOSIS — Z9109 Other allergy status, other than to drugs and biological substances: Secondary | ICD-10-CM

## 2022-07-21 DIAGNOSIS — Z7984 Long term (current) use of oral hypoglycemic drugs: Secondary | ICD-10-CM | POA: Diagnosis not present

## 2022-07-21 DIAGNOSIS — K5730 Diverticulosis of large intestine without perforation or abscess without bleeding: Secondary | ICD-10-CM | POA: Diagnosis not present

## 2022-07-21 LAB — URINALYSIS, ROUTINE W REFLEX MICROSCOPIC
Bacteria, UA: NONE SEEN
Bilirubin Urine: NEGATIVE
Glucose, UA: >=500 mg/dL — AB
Ketones, ur: 80 mg/dL — AB
Nitrite: NEGATIVE
Protein, ur: 100 mg/dL — AB
RBC / HPF: >50 RBC/hpf (ref 0–5)
Specific Gravity, Urine: 1.022 (ref 1.005–1.030)
WBC, UA: >50 WBC/hpf (ref 0–5)
pH: 5 (ref 5.0–8.0)

## 2022-07-21 LAB — COMPREHENSIVE METABOLIC PANEL
ALT: 14 U/L (ref 0–44)
AST: 21 U/L (ref 15–41)
Albumin: 3.7 g/dL (ref 3.5–5.0)
Alkaline Phosphatase: 76 U/L (ref 38–126)
Anion gap: 18 — ABNORMAL HIGH (ref 5–15)
BUN: 22 mg/dL (ref 8–23)
CO2: 14 mmol/L — ABNORMAL LOW (ref 22–32)
Calcium: 9.3 mg/dL (ref 8.9–10.3)
Chloride: 101 mmol/L (ref 98–111)
Creatinine, Ser: 1.32 mg/dL — ABNORMAL HIGH (ref 0.44–1.00)
GFR, Estimated: 41 mL/min — ABNORMAL LOW (ref >60)
Glucose, Bld: 285 mg/dL — ABNORMAL HIGH (ref 70–99)
Potassium: 4.3 mmol/L (ref 3.5–5.1)
Sodium: 133 mmol/L — ABNORMAL LOW (ref 135–145)
Total Bilirubin: 1.6 mg/dL — ABNORMAL HIGH (ref 0.3–1.2)
Total Protein: 7.7 g/dL (ref 6.5–8.1)

## 2022-07-21 LAB — CBC WITH DIFFERENTIAL/PLATELET
Abs Immature Granulocytes: 0.04 10*3/uL (ref 0.00–0.07)
Basophils Absolute: 0.1 10*3/uL (ref 0.0–0.1)
Basophils Relative: 1 %
Eosinophils Absolute: 0.2 10*3/uL (ref 0.0–0.5)
Eosinophils Relative: 2 %
HCT: 42.7 % (ref 36.0–46.0)
Hemoglobin: 13.8 g/dL (ref 12.0–15.0)
Immature Granulocytes: 0 %
Lymphocytes Relative: 20 %
Lymphs Abs: 2.1 10*3/uL (ref 0.7–4.0)
MCH: 28.6 pg (ref 26.0–34.0)
MCHC: 32.3 g/dL (ref 30.0–36.0)
MCV: 88.6 fL (ref 80.0–100.0)
Monocytes Absolute: 0.8 10*3/uL (ref 0.1–1.0)
Monocytes Relative: 8 %
Neutro Abs: 7.3 10*3/uL (ref 1.7–7.7)
Neutrophils Relative %: 69 %
Platelets: 294 10*3/uL (ref 150–400)
RBC: 4.82 MIL/uL (ref 3.87–5.11)
RDW: 14.5 % (ref 11.5–15.5)
WBC: 10.4 10*3/uL (ref 4.0–10.5)
nRBC: 0 % (ref 0.0–0.2)

## 2022-07-21 LAB — GLUCOSE, CAPILLARY
Glucose-Capillary: 172 mg/dL — ABNORMAL HIGH (ref 70–99)
Glucose-Capillary: 187 mg/dL — ABNORMAL HIGH (ref 70–99)

## 2022-07-21 LAB — LIPASE, BLOOD: Lipase: 31 U/L (ref 11–51)

## 2022-07-21 MED ORDER — DULOXETINE HCL 30 MG PO CPEP
60.0000 mg | ORAL_CAPSULE | Freq: Every day | ORAL | Status: DC
  Administered 2022-07-21 – 2022-07-29 (×9): 60 mg via ORAL
  Filled 2022-07-21 (×9): qty 2

## 2022-07-21 MED ORDER — SODIUM CHLORIDE 0.9 % IV SOLN
1.0000 g | INTRAVENOUS | Status: AC
  Administered 2022-07-22 – 2022-07-25 (×4): 1 g via INTRAVENOUS
  Filled 2022-07-21 (×5): qty 10

## 2022-07-21 MED ORDER — ZINC OXIDE 40 % EX OINT
TOPICAL_OINTMENT | Freq: Once | CUTANEOUS | Status: AC
  Filled 2022-07-21: qty 57

## 2022-07-21 MED ORDER — TRAZODONE HCL 50 MG PO TABS
25.0000 mg | ORAL_TABLET | Freq: Every evening | ORAL | Status: DC | PRN
  Administered 2022-07-22 – 2022-07-28 (×6): 25 mg via ORAL
  Filled 2022-07-21 (×6): qty 1

## 2022-07-21 MED ORDER — ATORVASTATIN CALCIUM 40 MG PO TABS
40.0000 mg | ORAL_TABLET | Freq: Every evening | ORAL | Status: DC
  Administered 2022-07-21 – 2022-07-28 (×8): 40 mg via ORAL
  Filled 2022-07-21 (×8): qty 1

## 2022-07-21 MED ORDER — ACETAMINOPHEN 325 MG PO TABS
650.0000 mg | ORAL_TABLET | Freq: Four times a day (QID) | ORAL | Status: DC | PRN
  Administered 2022-07-21 – 2022-07-29 (×13): 650 mg via ORAL
  Filled 2022-07-21 (×13): qty 2

## 2022-07-21 MED ORDER — SODIUM CHLORIDE 0.9 % IV BOLUS
500.0000 mL | Freq: Once | INTRAVENOUS | Status: AC
  Administered 2022-07-21: 500 mL via INTRAVENOUS

## 2022-07-21 MED ORDER — ISOSORBIDE MONONITRATE ER 30 MG PO TB24
15.0000 mg | ORAL_TABLET | Freq: Every day | ORAL | Status: DC
  Administered 2022-07-21 – 2022-07-29 (×9): 15 mg via ORAL
  Filled 2022-07-21 (×9): qty 1

## 2022-07-21 MED ORDER — DAPAGLIFLOZIN PROPANEDIOL 5 MG PO TABS: 5.0000 mg | ORAL_TABLET | Freq: Every day | ORAL | Status: DC

## 2022-07-21 MED ORDER — SODIUM CHLORIDE 0.9 % IV SOLN: INTRAVENOUS | Status: DC

## 2022-07-21 MED ORDER — INSULIN ASPART 100 UNIT/ML IJ SOLN
0.0000 [IU] | Freq: Three times a day (TID) | INTRAMUSCULAR | Status: DC
  Administered 2022-07-21: 3 [IU] via SUBCUTANEOUS
  Administered 2022-07-22 – 2022-07-24 (×5): 8 [IU] via SUBCUTANEOUS
  Administered 2022-07-24: 5 [IU] via SUBCUTANEOUS
  Administered 2022-07-25: 2 [IU] via SUBCUTANEOUS
  Administered 2022-07-25: 3 [IU] via SUBCUTANEOUS
  Administered 2022-07-25: 5 [IU] via SUBCUTANEOUS
  Administered 2022-07-26: 2 [IU] via SUBCUTANEOUS
  Administered 2022-07-26 (×2): 8 [IU] via SUBCUTANEOUS
  Administered 2022-07-27: 5 [IU] via SUBCUTANEOUS
  Administered 2022-07-27: 8 [IU] via SUBCUTANEOUS
  Administered 2022-07-27: 3 [IU] via SUBCUTANEOUS
  Administered 2022-07-28: 5 [IU] via SUBCUTANEOUS
  Administered 2022-07-28: 8 [IU] via SUBCUTANEOUS
  Administered 2022-07-29: 5 [IU] via SUBCUTANEOUS
  Administered 2022-07-29: 11 [IU] via SUBCUTANEOUS
  Filled 2022-07-21: qty 0.15

## 2022-07-21 MED ORDER — INSULIN ASPART 100 UNIT/ML IJ SOLN
0.0000 [IU] | Freq: Every day | INTRAMUSCULAR | Status: DC
  Administered 2022-07-24 – 2022-07-26 (×3): 2 [IU] via SUBCUTANEOUS
  Filled 2022-07-21: qty 0.05

## 2022-07-21 MED ORDER — ACETAMINOPHEN 650 MG RE SUPP: 650.0000 mg | Freq: Four times a day (QID) | RECTAL | Status: DC | PRN

## 2022-07-21 MED ORDER — ONDANSETRON HCL 4 MG/2ML IJ SOLN: 4.0000 mg | Freq: Four times a day (QID) | INTRAMUSCULAR | Status: DC | PRN

## 2022-07-21 MED ORDER — METOPROLOL TARTRATE 5 MG/5ML IV SOLN: 5.0000 mg | Freq: Four times a day (QID) | INTRAVENOUS | Status: DC | PRN

## 2022-07-21 MED ORDER — CLOPIDOGREL BISULFATE 75 MG PO TABS
75.0000 mg | ORAL_TABLET | Freq: Every day | ORAL | Status: DC
  Administered 2022-07-21 – 2022-07-26 (×6): 75 mg via ORAL
  Filled 2022-07-21 (×6): qty 1

## 2022-07-21 MED ORDER — PANTOPRAZOLE SODIUM 40 MG PO TBEC
40.0000 mg | DELAYED_RELEASE_TABLET | Freq: Two times a day (BID) | ORAL | Status: DC
  Administered 2022-07-21 – 2022-07-29 (×16): 40 mg via ORAL
  Filled 2022-07-21 (×16): qty 1

## 2022-07-21 MED ORDER — SODIUM CHLORIDE 0.9 % IV SOLN
1.0000 g | Freq: Once | INTRAVENOUS | Status: AC
  Administered 2022-07-21: 1 g via INTRAVENOUS
  Filled 2022-07-21: qty 10

## 2022-07-21 MED ORDER — ALBUTEROL SULFATE (2.5 MG/3ML) 0.083% IN NEBU: 2.5000 mg | INHALATION_SOLUTION | RESPIRATORY_TRACT | Status: DC | PRN

## 2022-07-21 MED ORDER — METOPROLOL SUCCINATE ER 25 MG PO TB24
25.0000 mg | ORAL_TABLET | Freq: Every day | ORAL | Status: DC
  Administered 2022-07-21 – 2022-07-29 (×9): 25 mg via ORAL
  Filled 2022-07-21 (×9): qty 1

## 2022-07-21 MED ORDER — GABAPENTIN 300 MG PO CAPS
300.0000 mg | ORAL_CAPSULE | Freq: Two times a day (BID) | ORAL | Status: DC
  Administered 2022-07-21 – 2022-07-29 (×16): 300 mg via ORAL
  Filled 2022-07-21 (×16): qty 1

## 2022-07-21 MED ORDER — DORZOLAMIDE HCL-TIMOLOL MAL 2-0.5 % OP SOLN
1.0000 [drp] | Freq: Two times a day (BID) | OPHTHALMIC | Status: DC
  Administered 2022-07-21 – 2022-07-29 (×16): 1 [drp] via OPHTHALMIC
  Filled 2022-07-21: qty 10

## 2022-07-21 MED ORDER — ONDANSETRON HCL 4 MG PO TABS
4.0000 mg | ORAL_TABLET | Freq: Four times a day (QID) | ORAL | Status: DC | PRN
  Administered 2022-07-25 – 2022-07-28 (×2): 4 mg via ORAL
  Filled 2022-07-21 (×3): qty 1

## 2022-07-21 MED ORDER — MORPHINE SULFATE (PF) 2 MG/ML IV SOLN
1.0000 mg | INTRAVENOUS | Status: DC | PRN
  Administered 2022-07-21: 1 mg via INTRAVENOUS
  Filled 2022-07-21: qty 1

## 2022-07-21 MED ORDER — APIXABAN 5 MG PO TABS
5.0000 mg | ORAL_TABLET | Freq: Two times a day (BID) | ORAL | Status: DC
  Administered 2022-07-21 – 2022-07-26 (×11): 5 mg via ORAL
  Filled 2022-07-21 (×11): qty 1

## 2022-07-21 NOTE — ED Provider Notes (Signed)
[place] EMERGENCY DEPARTMENT AT [facility] Provider Note   CSN: [id number] Arrival date & time: 07/21/22  4098     History  Chief Complaint  Patient presents with   Diarrhea   Nausea    Maria Fox is a 77 y.o. female with a PMHx of DM, CHF, who presents to the ED with concerns for intermittent soft/watery non-bloody stools onset 5 days. Has had at least 2 dozen episodes since the onset of her symptoms. Has associated nausea, dysuria, frequency, urgency.  Has tried Imodium x 1 tablet for her symptoms.  Denies vomiting, hematuria, constipation, fever.  Does not have a gallbladder at this time.  The history is provided by the patient. No language interpreter was used.       Home Medications Prior to Admission medications   Medication Sig Start Date End Date Taking? Authorizing Provider  albuterol (PROAIR HFA) 108 (90 Base) MCG/ACT inhaler Inhale 2 puffs into the lungs every 6 (six) hours as needed for wheezing or shortness of breath. 05/10/20   Eustaquio Boyden, MD  apixaban (ELIQUIS) 5 MG TABS tablet TAKE ONE TABLET BY MOUTH TWICE DAILY 05/21/22   Quintella Reichert, MD  atorvastatin (LIPITOR) 40 MG tablet TAKE ONE TABLET BY MOUTH EVERY EVENING 01/22/22   Eustaquio Boyden, MD  ciprofloxacin (CIPRO) 250 MG tablet Take 1 tablet (250 mg total) by mouth 2 (two) times daily. 04/06/22   Eustaquio Boyden, MD  clopidogrel (PLAVIX) 75 MG tablet Take 1 tablet (75 mg total) by mouth daily. 04/17/22   Quintella Reichert, MD  dapagliflozin propanediol (FARXIGA) 5 MG TABS tablet Take 1 tablet (5 mg total) by mouth daily before breakfast. 03/19/22   Eustaquio Boyden, MD  dorzolamide-timolol (COSOPT) 2-0.5 % ophthalmic solution Place 1 drop into both eyes 2 (two) times daily.    [provider]  DULoxetine (CYMBALTA) 60 MG capsule Take 1 capsule (60 mg total) by mouth daily. 04/22/22   Eustaquio Boyden, MD  furosemide (LASIX) 20 MG tablet Take 1 tablet (20 mg total) by mouth daily as  needed for fluid or edema (leg swelling). 07/27/20   Arrien, York Ram, MD  gabapentin (NEURONTIN) 300 MG capsule TAKE ONE CAPSULE BY MOUTH TWICE DAILY 02/14/22   Eustaquio Boyden, MD  glipiZIDE (GLUCOTROL) 10 MG tablet Take 1 tablet (10 mg total) by mouth 2 (two) times daily. 11/06/21   Eustaquio Boyden, MD  isosorbide mononitrate (IMDUR) 30 MG 24 hr tablet TAKE 1/2 TABLET BY MOUTH ONCE DAILY 04/17/22   Quintella Reichert, MD  latanoprost (XALATAN) 0.005 % ophthalmic solution Place 1 drop into both eyes at bedtime.  07/14/18   [provider]  metFORMIN (GLUCOPHAGE-XR) 500 MG 24 hr tablet TAKE TWO TABLETS BY MOUTH EVERY MORNING 12/14/21   Eustaquio Boyden, MD  metoprolol succinate (TOPROL XL) 25 MG 24 hr tablet Take 1 tablet (25 mg total) by mouth daily. 04/05/22   Quintella Reichert, MD  nitroGLYCERIN (NITROSTAT) 0.4 MG SL tablet Dissolve 1 tab under tongue as needed for chest pain. May repeat every 5 minutes x 2 doses. If no relief call 9-1-1. 12/14/21   Quintella Reichert, MD  pantoprazole (PROTONIX) 40 MG tablet TAKE ONE TABLET BY MOUTH TWICE DAILY 05/14/22   Eustaquio Boyden, MD      Allergies    Bee venom, Mushroom extract complex, Penicillins, Codeine, Sulfa antibiotics, Iohexol, and Erythromycin base    Review of Systems   Review of Systems  Gastrointestinal:  Positive for diarrhea.  All other systems reviewed and are negative.   Physical Exam Updated Vital Signs BP 135/78   Pulse 100   Temp 97.7 F (36.5 C) (Oral)   Resp 20   Ht 5\' 3"  (1.6 m)   Wt 60.3 kg   SpO2 100%   BMI 23.56 kg/m  Physical Exam Vitals and nursing note reviewed.  Constitutional:      General: She is not in acute distress.    Appearance: She is not diaphoretic.  HENT:     Head: Normocephalic and atraumatic.     Mouth/Throat:     Pharynx: No oropharyngeal exudate.  Eyes:     General: No scleral icterus.    Conjunctiva/sclera: Conjunctivae normal.  Cardiovascular:     Rate and Rhythm: Normal rate  and regular rhythm.     Pulses: Normal pulses.     Heart sounds: Normal heart sounds.  Pulmonary:     Effort: Pulmonary effort is normal. No respiratory distress.     Breath sounds: Normal breath sounds. No wheezing.  Abdominal:     General: Bowel sounds are normal.     Palpations: Abdomen is soft. There is no mass.     Tenderness: There is generalized abdominal tenderness. There is no guarding or rebound.     Comments: Diffuse abdominal tenderness to palpation.  Musculoskeletal:        General: Normal range of motion.     Cervical back: Normal range of motion and neck supple.  Skin:    General: Skin is warm and dry.     Comments: RN chaperone present for exam.  Skin breakdown noted to bottom.  No appreciable area of ulceration noted.  Erythema noted to the area.  Neurological:     Mental Status: She is alert.  Psychiatric:        Behavior: Behavior normal.     ED Results / Procedures / Treatments   Labs (all labs ordered are listed, but only abnormal results are displayed) Labs Reviewed  COMPREHENSIVE METABOLIC PANEL - Abnormal; Notable for the following components:      Result Value   Sodium 133 (*)    CO2 14 (*)    Glucose, Bld 285 (*)    Creatinine, Ser 1.32 (*)    Total Bilirubin 1.6 (*)    GFR, Estimated 41 (*)    Anion gap 18 (*)    All other components within normal limits  URINALYSIS, ROUTINE W REFLEX MICROSCOPIC - Abnormal; Notable for the following components:   APPearance CLOUDY (*)    Glucose, UA >=500 (*)    Hgb urine dipstick MODERATE (*)    Ketones, ur 80 (*)    Protein, ur 100 (*)    Leukocytes,Ua LARGE (*)    All other components within normal limits  C DIFFICILE QUICK SCREEN W PCR REFLEX    CBC WITH DIFFERENTIAL/PLATELET  LIPASE, BLOOD    EKG None  Radiology CT ABDOMEN PELVIS WO CONTRAST  Result Date: 07/21/2022 CLINICAL DATA:  Nonlocalized abdominal pain EXAM: CT ABDOMEN AND PELVIS WITHOUT CONTRAST TECHNIQUE: Multidetector CT imaging of the  abdomen and pelvis was performed following the standard protocol without IV contrast. RADIATION DOSE REDUCTION: This exam was performed according to the departmental dose-optimization program which includes automated exposure control, adjustment of the mA and/or kV according to patient size and/or use of iterative reconstruction technique. COMPARISON:  04/26/2021 FINDINGS: Lower chest: Unremarkable. Hepatobiliary: No suspicious focal abnormality in the liver on this study without intravenous contrast. Gallbladder is  surgically absent. No intrahepatic or extrahepatic biliary dilation. Pancreas: No focal mass lesion. No dilatation of the main duct. No intraparenchymal cyst. No peripancreatic edema. Spleen: Calcified granulomata noted. Adrenals/Urinary Tract: No adrenal nodule or mass. Kidneys unremarkable. No evidence for hydroureter. The urinary bladder appears normal for the degree of distention. Stomach/Bowel: Stomach is unremarkable. No gastric wall thickening. No evidence of outlet obstruction. Duodenum is normally positioned as is the ligament of Treitz. No small bowel wall thickening. No small bowel dilatation. The terminal ileum is normal. The appendix is normal. No gross colonic mass. No colonic wall thickening. Diverticular changes are noted in the left colon without evidence of diverticulitis. Vascular/Lymphatic: There is moderate atherosclerotic calcification of the abdominal aorta without aneurysm. There is no gastrohepatic or hepatoduodenal ligament lymphadenopathy. No retroperitoneal or mesenteric lymphadenopathy. No pelvic sidewall lymphadenopathy. Reproductive: Unremarkable. Other: No intraperitoneal free fluid. Musculoskeletal: No worrisome lytic or sclerotic osseous abnormality. IMPRESSION: 1. No acute findings in the abdomen or pelvis. Specifically, no findings to explain the patient's history of abdominal pain. 2. Left colonic diverticulosis without diverticulitis. 3.  Aortic Atherosclerosis  (ICD10-I70.0). Electronically Signed   By: Kennith Center M.D.   On: 07/21/2022 10:56    Procedures Procedures    Medications Ordered in ED Medications  cefTRIAXone (ROCEPHIN) 1 g in sodium chloride 0.9 % 100 mL IVPB (1 g Intravenous New Bag/Given 07/21/22 1220)  sodium chloride 0.9 % bolus 500 mL (0 mLs Intravenous Stopped 07/21/22 1200)    ED Course/ Medical Decision Making/ A&P Clinical Course as of 07/21/22 1517  Sun Jul 21, 2022  1209 Patient reevaluated discussed with patient plans for admission.  Patient is unsure if she has taken Rocephin before.  Patient notes that her symptoms feel similar to when she was admitted previously. [SB]  1259 Consult with hospitalist, Dr. Erenest Blank [SB]    Clinical Course User Index [SB] Yuka Lallier A, PA-C                             Medical Decision Making Amount and/or Complexity of Data Reviewed Labs: ordered. Radiology: ordered.  Risk OTC drugs. Decision regarding hospitalization.   Pt presents with concerns for abdominal pain, diarrhea onset 5 days. No sick contacts. Vital signs, pt afebrile. On exam, pt with diffuse abdominal TTP. No acute cardiovascular, respiratory exam findings. Differential diagnosis includes appendicitis, diverticulitis, viral etiology, enteritis, acute cystitis, pyelonephritis, nephrolithiasis.    Co morbidities that complicate the patient evaluation: HLD, DM, CHF  Additional history obtained:  External records from outside source obtained and reviewed including: Pt has a history of similar symptoms on December 2023 to which she was admitted to the hospital  Labs:  I ordered, and personally interpreted labs.  The pertinent results include:   Lipase unremarkable Urinalysis notable moderate for hemoglobin and large amount of leuks, nitrate negative CMP with elevated creatinine at 1.32, glucose elevated 25, bilirubin elevated at 1.6, GFR decreased to 41, anion gap at 18  Imaging: I ordered imaging studies  including CT AP I independently visualized and interpreted imaging which showed:  1. No acute findings in the abdomen or pelvis. Specifically, no  findings to explain the patient's history of abdominal pain.  2. Left colonic diverticulosis without diverticulitis.  3.  Aortic Atherosclerosis (ICD10-I70.0).   I agree with the radiologist interpretation  Medications:  I ordered medication including IV fluids and Rocephin for symptom management and cystitis treatment I have reviewed the patients home medicines  and have made adjustments as needed   Consultations: I requested consultation with the Hospitalist, Dr. Erenest Blank and discussed lab and imaging findings as well as pertinent plan - they recommend: will evaluate for admission  Disposition: Presenting suspicious for dehydration in the setting of diarrhea.  Also notable for AKI and acute cystitis.  Doubt concerns at this time for diverticulitis, appendicitis, pancreatitis. After consideration of the diagnostic results and the patients response to treatment, I feel that the patient would benefit from Admission to the hospital.  Discussed with patient plans for admission.  Patient agreeable at this time for admission.  Patient appears safe for admission at this time.  This chart was dictated using voice recognition software, Dragon. Despite the best efforts of this provider to proofread and correct errors, errors may still occur which can change documentation meaning.  Final Clinical Impression(s) / ED Diagnoses Final diagnoses:  Dehydration  Acute cystitis with hematuria  AKI (acute kidney injury) ([name])  Diarrhea, unspecified type    Rx / DC Orders ED Discharge Orders     None         Shakim Faith A, PA-C 07/21/22 1520    Wynetta Fines, MD 07/21/22 1533

## 2022-07-21 NOTE — ED Notes (Signed)
ED TO INPATIENT HANDOFF REPORT  Name/Age/Gender Maria Fox 77 y.o. female  Code Status    Code Status Orders  (From admission, onward)           Start     Ordered   07/21/22 1447  Full code  Continuous       Question:  By:  Answer:  Consent: discussion documented in EHR   07/21/22 1449           Code Status History     Date Active Date Inactive Code Status Order ID Comments User Context   03/06/2022 1430 03/12/2022 2107 DNR [id number]  Zannie Cove, MD Inpatient   02/23/2022 2133 03/06/2022 1430 Full Code [id number]  Jacques Navy, MD ED   07/29/2020 1902 08/05/2020 2218 Full Code [id number]  Emeline General, MD ED   07/10/2020 2312 07/27/2020 2037 Full Code [id number]  Rometta Emery, MD ED   07/01/2020 1634 07/04/2020 1713 Full Code [id number]  Corrin Parker, PA-C Inpatient   07/07/2019 0858 07/23/2019 2148 Full Code [id number]  Jonah Blue, MD ED   11/12/2017 1649 11/13/2017 1647 Full Code [id number]  Iran Ouch, MD Inpatient   05/27/2016 1914 05/28/2016 1344 Full Code [id number]  Vanetta Mulders, MD ED   05/27/2016 1500 05/27/2016 1914 Full Code [id number]  Cristina Gong, PA ED   04/21/2016 2142 04/23/2016 2200 Full Code [id number]  Arthor Captain, PA-C ED   02/16/2016 1852 02/19/2016 0135 Full Code [id number]  Jerelyn Scott, MD ED   07/26/2015 0014 07/27/2015 1655 Full Code [id number]  Clydie Braun, MD ED   07/09/2015 0730 07/18/2015 1620 Full Code [id number]  Gwenyth Bender, NP ED   09/01/2014 1831 07/09/2015 0730 Full Code [id number]  Margit Hanks, MD Outpatient   08/27/2014 1917 09/01/2014 1831 DNR [id number]  Margit Hanks, MD Outpatient   08/07/2014 1821 08/22/2014 1943 Full Code [id number]  Esperanza Sheets, MD Inpatient   07/08/2014 0153 07/18/2014 1757 Full Code [id number]  Rolly Salter, MD Inpatient   06/02/2012 0324 06/03/2012 1618 Full Code [id number]  Eduard Clos, MD Inpatient   06/23/2011 1933 06/24/2011 1703 Full Code [id number]  Margie Ege, RN  Inpatient   05/27/2011 0442 05/27/2011 2145 Full Code [id number]  Leanne Chang ED   05/27/2011 0414 05/27/2011 0442 Full Code [id number]  Joya Gaskins, MD ED       Home/SNF/Other Home  Chief Complaint UTI (urinary tract infection) [N39.0]  Level of Care/Admitting Diagnosis ED Disposition     ED Disposition  Admit   Condition  --   Comment  Hospital Area: [facility] [[id number]]  Level of Care: Med-Surg [16]  May place patient in observation at [facility] or [name] if equivalent level of care is available:: Yes  Covid Evaluation: Asymptomatic - no recent exposure (last 10 days) testing not required  Diagnosis: UTI (urinary tract infection) [242353]  Admitting Physician: Maryln Gottron [[id number]]  Attending Physician: Olexa.Dam, MIR Jaxson.Roy [[id number]]          Medical History Past Medical History:  Diagnosis Date   Acute deep vein thrombosis (DVT) of left tibial vein ([name]) 07/11/2019   Acute left PCA stroke ([name]) 07/13/2015   Angioedema    Atrial flutter ([name])    Autoimmune deficiency syndrome ([name])    CAD (coronary artery disease), native coronary artery 06/2014   Chronic diastolic CHF (congestive heart failure) ([name]) 08/27/2014   Chronic kidney disease, stage 3b ([name])  Closed fracture of maxilla ([name])    COVID-19 virus infection 03/2020   CVA (cerebral infarction) 07/2014   bilateral corona radiata - periCABG   Dermatitis    eval [name] 2011: eczema, eval [name] 2011: bx negative for lichen simplex or derm herpetiformis   DM (diabetes mellitus), type 2, uncontrolled w/neurologic complication 06/02/2012   ?autonomic neuropathy, gastroparesis (06/2014)    Epidermal cyst of neck 03/25/2017   Excised by derm Diona Browner)   HCAP (healthcare-associated pneumonia) 06/2014   History of chicken pox    HLD (hyperlipidemia)    Hx of migraines    remote   Hypertension    Maxillary fracture ([name])    Mitral regurgitation    Multiple allergies     mold, wool, dust, feathers   NSVT (nonsustained ventricular tachycardia) ([name])    Orthostatic hypotension 07/2015   Pleural effusion, left    RBBB    S/P lens implant    left side ([name])   Tricuspid regurgitation    UTI (urinary tract infection) 06/2014   Vitiligo     Allergies Allergies  Allergen Reactions   Bee Venom Anaphylaxis, Swelling and Other (See Comments)    Throat swelling   Mushroom Extract Complex Anaphylaxis   Penicillins Anaphylaxis, Hives and Swelling    Tolerates cephalosporins including cephalexin multiple times.  Has patient had a PCN reaction causing immediate rash, facial/tongue/throat swelling, SOB or lightheadedness with hypotension: Yes Has patient had a PCN reaction causing severe rash involving mucus membranes or skin necrosis: Yes Has patient had a PCN reaction that required hospitalization Yes Has patient had a PCN reaction occurring within the last 10 years: No     Codeine Nausea And Vomiting   Sulfa Antibiotics Nausea And Vomiting   Iohexol Itching and Swelling   Erythromycin Base Rash    IV Location/Drains/Wounds Patient Lines/Drains/Airways Status     Active Line/Drains/Airways     Name Placement date Placement time Site Days   Peripheral IV 07/21/22 20 G Right Antecubital 07/21/22  0939  Antecubital  less than 1   Incision - 4 Ports Abdomen 1: Umbilicus 2: Mid;Upper 3: Right;Medial 4: Right;Lateral 07/07/19  1410  -- 1110   Wound / Incision (Open or Dehisced) 08/03/20 Non-pressure wound Buttocks Left 08/03/20  0730  Buttocks  717            Labs/Imaging Results for orders placed or performed during the hospital encounter of 07/21/22 (from the past 48 hour(s))  CBC with Differential     Status: None   Collection Time: 07/21/22  9:26 AM  Result Value Ref Range   WBC 10.4 4.0 - 10.5 K/uL   RBC 4.82 3.87 - 5.11 MIL/uL   Hemoglobin 13.8 12.0 - 15.0 g/dL   HCT 45.4 09.8 - 11.9 %   MCV 88.6 80.0 - 100.0 fL   MCH 28.6 26.0 - 34.0 pg    MCHC 32.3 30.0 - 36.0 g/dL   RDW 14.7 82.9 - 56.2 %   Platelets 294 150 - 400 K/uL   nRBC 0.0 0.0 - 0.2 %   Neutrophils Relative % 69 %   Neutro Abs 7.3 1.7 - 7.7 K/uL   Lymphocytes Relative 20 %   Lymphs Abs 2.1 0.7 - 4.0 K/uL   Monocytes Relative 8 %   Monocytes Absolute 0.8 0.1 - 1.0 K/uL   Eosinophils Relative 2 %   Eosinophils Absolute 0.2 0.0 - 0.5 K/uL   Basophils Relative 1 %   Basophils Absolute 0.1 0.0 -  0.1 K/uL   Immature Granulocytes 0 %   Abs Immature Granulocytes 0.04 0.00 - 0.07 K/uL    Comment: Performed at [facility], [address] [place], [place] [address]  Comprehensive metabolic panel     Status: Abnormal   Collection Time: 07/21/22  9:26 AM  Result Value Ref Range   Sodium 133 (L) 135 - 145 mmol/L   Potassium 4.3 3.5 - 5.1 mmol/L   Chloride 101 98 - 111 mmol/L   CO2 14 (L) 22 - 32 mmol/L   Glucose, Bld 285 (H) 70 - 99 mg/dL    Comment: Glucose reference range applies only to samples taken after fasting for at least 8 hours.   BUN 22 8 - 23 mg/dL   Creatinine, Ser 6.04 (H) 0.44 - 1.00 mg/dL   Calcium 9.3 8.9 - 54.0 mg/dL   Total Protein 7.7 6.5 - 8.1 g/dL   Albumin 3.7 3.5 - 5.0 g/dL   AST 21 15 - 41 U/L   ALT 14 0 - 44 U/L   Alkaline Phosphatase 76 38 - 126 U/L   Total Bilirubin 1.6 (H) 0.3 - 1.2 mg/dL   GFR, Estimated 41 (L) >60 mL/min    Comment: (NOTE) Calculated using the CKD-EPI Creatinine Equation (2021)    Anion gap 18 (H) 5 - 15    Comment: Performed at [facility], [address] [place], [place] [address]  Lipase, blood     Status: None   Collection Time: 07/21/22  9:26 AM  Result Value Ref Range   Lipase 31 11 - 51 U/L    Comment: Performed at [facility], [address], [place], [place] [address]  Urinalysis, Routine w reflex microscopic -Urine, Clean Catch     Status: Abnormal   Collection Time: 07/21/22  9:27 AM  Result Value Ref Range   Color, Urine YELLOW YELLOW    APPearance CLOUDY (A) CLEAR   Specific Gravity, Urine 1.022 1.005 - 1.030   pH 5.0 5.0 - 8.0   Glucose, UA >=500 (A) NEGATIVE mg/dL   Hgb urine dipstick MODERATE (A) NEGATIVE   Bilirubin Urine NEGATIVE NEGATIVE   Ketones, ur 80 (A) NEGATIVE mg/dL   Protein, ur 956 (A) NEGATIVE mg/dL   Nitrite NEGATIVE NEGATIVE   Leukocytes,Ua LARGE (A) NEGATIVE   RBC / HPF >50 0 - 5 RBC/hpf   WBC, UA >50 0 - 5 WBC/hpf   Bacteria, UA NONE SEEN NONE SEEN   Squamous Epithelial / HPF 0-5 0 - 5 /HPF   WBC Clumps PRESENT    Mucus PRESENT    Hyaline Casts, UA PRESENT     Comment: Performed at [facility], [address], [place], [place] [address]   CT ABDOMEN PELVIS WO CONTRAST  Result Date: 07/21/2022 CLINICAL DATA:  Nonlocalized abdominal pain EXAM: CT ABDOMEN AND PELVIS WITHOUT CONTRAST TECHNIQUE: Multidetector CT imaging of the abdomen and pelvis was performed following the standard protocol without IV contrast. RADIATION DOSE REDUCTION: This exam was performed according to the departmental dose-optimization program which includes automated exposure control, adjustment of the mA and/or kV according to patient size and/or use of iterative reconstruction technique. COMPARISON:  04/26/2021 FINDINGS: Lower chest: Unremarkable. Hepatobiliary: No suspicious focal abnormality in the liver on this study without intravenous contrast. Gallbladder is surgically absent. No intrahepatic or extrahepatic biliary dilation. Pancreas: No focal mass lesion. No dilatation of the main duct. No intraparenchymal cyst. No peripancreatic edema. Spleen: Calcified granulomata noted.  Adrenals/Urinary Tract: No adrenal nodule or mass. Kidneys unremarkable. No evidence for hydroureter. The urinary bladder appears normal for the degree of distention. Stomach/Bowel: Stomach is unremarkable. No gastric wall thickening. No evidence of outlet obstruction. Duodenum is normally positioned as is the ligament of Treitz. No small  bowel wall thickening. No small bowel dilatation. The terminal ileum is normal. The appendix is normal. No gross colonic mass. No colonic wall thickening. Diverticular changes are noted in the left colon without evidence of diverticulitis. Vascular/Lymphatic: There is moderate atherosclerotic calcification of the abdominal aorta without aneurysm. There is no gastrohepatic or hepatoduodenal ligament lymphadenopathy. No retroperitoneal or mesenteric lymphadenopathy. No pelvic sidewall lymphadenopathy. Reproductive: Unremarkable. Other: No intraperitoneal free fluid. Musculoskeletal: No worrisome lytic or sclerotic osseous abnormality. IMPRESSION: 1. No acute findings in the abdomen or pelvis. Specifically, no findings to explain the patient's history of abdominal pain. 2. Left colonic diverticulosis without diverticulitis. 3.  Aortic Atherosclerosis (ICD10-I70.0). Electronically Signed   By: Kennith Center M.D.   On: 07/21/2022 10:56    Pending Labs Unresulted Labs (From admission, onward)     Start     Ordered   07/22/22 0500  Basic metabolic panel  Tomorrow morning,   R        07/21/22 1449   07/22/22 0500  CBC  Tomorrow morning,   R        07/21/22 1449   07/21/22 1214  C Difficile Quick Screen w PCR reflex  (C Difficile quick screen w PCR reflex panel )  Once, for 24 hours,   URGENT       References:    CDiff Information Tool   07/21/22 1213            Vitals/Pain Today's Vitals   07/21/22 1145 07/21/22 1200 07/21/22 1215 07/21/22 1324  BP: (!) 145/78 (!) 149/76 (!) 158/82 (!) 147/79  Pulse: (!) 106 (!) 101 (!) 102 (!) 106  Resp:    18  Temp:    98.9 F (37.2 C)  TempSrc:    Oral  SpO2: 100% 100% 100% 100%  Weight:      Height:      PainSc:        Isolation Precautions Enteric precautions (UV disinfection)  Medications Medications  cefTRIAXone (ROCEPHIN) 1 g in sodium chloride 0.9 % 100 mL IVPB (has no administration in time range)  atorvastatin (LIPITOR) tablet 40 mg (has  no administration in time range)  isosorbide mononitrate (IMDUR) 24 hr tablet 15 mg (has no administration in time range)  metoprolol succinate (TOPROL-XL) 24 hr tablet 25 mg (has no administration in time range)  DULoxetine (CYMBALTA) DR capsule 60 mg (has no administration in time range)  pantoprazole (PROTONIX) EC tablet 40 mg (has no administration in time range)  apixaban (ELIQUIS) tablet 5 mg (has no administration in time range)  clopidogrel (PLAVIX) tablet 75 mg (has no administration in time range)  gabapentin (NEURONTIN) capsule 300 mg (has no administration in time range)  dorzolamide-timolol (COSOPT) 2-0.5 % ophthalmic solution 1 drop (has no administration in time range)  insulin aspart (novoLOG) injection 0-15 Units (has no administration in time range)  insulin aspart (novoLOG) injection 0-5 Units (has no administration in time range)  0.9 %  sodium chloride infusion (has no administration in time range)  acetaminophen (TYLENOL) tablet 650 mg (650 mg Oral Given 07/21/22 1518)    Or  acetaminophen (TYLENOL) suppository 650 mg ( Rectal See Alternative 07/21/22 1518)  traZODone (DESYREL) tablet 25 mg (has  no administration in time range)  ondansetron (ZOFRAN) tablet 4 mg (has no administration in time range)    Or  ondansetron (ZOFRAN) injection 4 mg (has no administration in time range)  albuterol (PROVENTIL) (2.5 MG/3ML) 0.083% nebulizer solution 2.5 mg (has no administration in time range)  metoprolol tartrate (LOPRESSOR) injection 5 mg (has no administration in time range)  morphine (PF) 2 MG/ML injection 1 mg (1 mg Intravenous Given 07/21/22 1518)  sodium chloride 0.9 % bolus 500 mL (0 mLs Intravenous Stopped 07/21/22 1200)  cefTRIAXone (ROCEPHIN) 1 g in sodium chloride 0.9 % 100 mL IVPB (0 g Intravenous Stopped 07/21/22 1250)  liver oil-zinc oxide (DESITIN) 40 % ointment ( Topical Given 07/21/22 1356)    Mobility walks

## 2022-07-21 NOTE — Plan of Care (Signed)

## 2022-07-21 NOTE — ED Triage Notes (Signed)
Patient brought in by EMS due to nausea and diarrhea X 4 days. Reports trying to take anti diarrheal medications with no help. Decreased food and fluid intake over the past few days. Reports right side pain X 4 days as well.
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
## 2022-07-22 DIAGNOSIS — Z8616 Personal history of COVID-19: Secondary | ICD-10-CM | POA: Diagnosis not present

## 2022-07-22 DIAGNOSIS — N390 Urinary tract infection, site not specified: Secondary | ICD-10-CM | POA: Diagnosis present

## 2022-07-22 DIAGNOSIS — I480 Paroxysmal atrial fibrillation: Secondary | ICD-10-CM | POA: Diagnosis present

## 2022-07-22 DIAGNOSIS — K219 Gastro-esophageal reflux disease without esophagitis: Secondary | ICD-10-CM | POA: Diagnosis present

## 2022-07-22 DIAGNOSIS — E1169 Type 2 diabetes mellitus with other specified complication: Secondary | ICD-10-CM | POA: Diagnosis present

## 2022-07-22 DIAGNOSIS — Z955 Presence of coronary angioplasty implant and graft: Secondary | ICD-10-CM | POA: Diagnosis not present

## 2022-07-22 DIAGNOSIS — R319 Hematuria, unspecified: Secondary | ICD-10-CM | POA: Diagnosis not present

## 2022-07-22 DIAGNOSIS — I4892 Unspecified atrial flutter: Secondary | ICD-10-CM | POA: Diagnosis present

## 2022-07-22 DIAGNOSIS — E785 Hyperlipidemia, unspecified: Secondary | ICD-10-CM | POA: Diagnosis present

## 2022-07-22 DIAGNOSIS — Z880 Allergy status to penicillin: Secondary | ICD-10-CM | POA: Diagnosis not present

## 2022-07-22 DIAGNOSIS — E1165 Type 2 diabetes mellitus with hyperglycemia: Secondary | ICD-10-CM | POA: Diagnosis present

## 2022-07-22 DIAGNOSIS — R310 Gross hematuria: Secondary | ICD-10-CM | POA: Diagnosis not present

## 2022-07-22 DIAGNOSIS — E1149 Type 2 diabetes mellitus with other diabetic neurological complication: Secondary | ICD-10-CM | POA: Diagnosis present

## 2022-07-22 DIAGNOSIS — Z86718 Personal history of other venous thrombosis and embolism: Secondary | ICD-10-CM | POA: Diagnosis not present

## 2022-07-22 DIAGNOSIS — N1832 Chronic kidney disease, stage 3b: Secondary | ICD-10-CM | POA: Diagnosis present

## 2022-07-22 DIAGNOSIS — E860 Dehydration: Secondary | ICD-10-CM | POA: Diagnosis present

## 2022-07-22 DIAGNOSIS — Z951 Presence of aortocoronary bypass graft: Secondary | ICD-10-CM | POA: Diagnosis not present

## 2022-07-22 DIAGNOSIS — I130 Hypertensive heart and chronic kidney disease with heart failure and stage 1 through stage 4 chronic kidney disease, or unspecified chronic kidney disease: Secondary | ICD-10-CM | POA: Diagnosis present

## 2022-07-22 DIAGNOSIS — I2510 Atherosclerotic heart disease of native coronary artery without angina pectoris: Secondary | ICD-10-CM | POA: Diagnosis present

## 2022-07-22 DIAGNOSIS — E1143 Type 2 diabetes mellitus with diabetic autonomic (poly)neuropathy: Secondary | ICD-10-CM | POA: Diagnosis present

## 2022-07-22 DIAGNOSIS — N3000 Acute cystitis without hematuria: Secondary | ICD-10-CM | POA: Diagnosis not present

## 2022-07-22 DIAGNOSIS — B3731 Acute candidiasis of vulva and vagina: Secondary | ICD-10-CM | POA: Diagnosis present

## 2022-07-22 DIAGNOSIS — E1122 Type 2 diabetes mellitus with diabetic chronic kidney disease: Secondary | ICD-10-CM | POA: Diagnosis present

## 2022-07-22 DIAGNOSIS — Z7901 Long term (current) use of anticoagulants: Secondary | ICD-10-CM | POA: Diagnosis not present

## 2022-07-22 DIAGNOSIS — I5032 Chronic diastolic (congestive) heart failure: Secondary | ICD-10-CM | POA: Diagnosis present

## 2022-07-22 DIAGNOSIS — N3001 Acute cystitis with hematuria: Secondary | ICD-10-CM | POA: Diagnosis not present

## 2022-07-22 DIAGNOSIS — Z8249 Family history of ischemic heart disease and other diseases of the circulatory system: Secondary | ICD-10-CM | POA: Diagnosis not present

## 2022-07-22 DIAGNOSIS — Z7984 Long term (current) use of oral hypoglycemic drugs: Secondary | ICD-10-CM | POA: Diagnosis not present

## 2022-07-22 LAB — BASIC METABOLIC PANEL
Anion gap: 9 (ref 5–15)
BUN: 19 mg/dL (ref 8–23)
CO2: 18 mmol/L — ABNORMAL LOW (ref 22–32)
Calcium: 8.4 mg/dL — ABNORMAL LOW (ref 8.9–10.3)
Chloride: 107 mmol/L (ref 98–111)
Creatinine, Ser: 1.16 mg/dL — ABNORMAL HIGH (ref 0.44–1.00)
GFR, Estimated: 48 mL/min — ABNORMAL LOW (ref >60)
Glucose, Bld: 249 mg/dL — ABNORMAL HIGH (ref 70–99)
Potassium: 3.7 mmol/L (ref 3.5–5.1)
Sodium: 134 mmol/L — ABNORMAL LOW (ref 135–145)

## 2022-07-22 LAB — CBC
HCT: 36.6 % (ref 36.0–46.0)
Hemoglobin: 12 g/dL (ref 12.0–15.0)
MCH: 29 pg (ref 26.0–34.0)
MCHC: 32.8 g/dL (ref 30.0–36.0)
MCV: 88.4 fL (ref 80.0–100.0)
Platelets: 222 10*3/uL (ref 150–400)
RBC: 4.14 MIL/uL (ref 3.87–5.11)
RDW: 14.7 % (ref 11.5–15.5)
WBC: 6.6 10*3/uL (ref 4.0–10.5)
nRBC: 0 % (ref 0.0–0.2)

## 2022-07-22 LAB — GLUCOSE, CAPILLARY
Glucose-Capillary: 253 mg/dL — ABNORMAL HIGH (ref 70–99)
Glucose-Capillary: 294 mg/dL — ABNORMAL HIGH (ref 70–99)
Glucose-Capillary: 99 mg/dL (ref 70–99)
Glucose-Capillary: 169 mg/dL — ABNORMAL HIGH (ref 70–99)

## 2022-07-22 LAB — C DIFFICILE QUICK SCREEN W PCR REFLEX
C Diff antigen: NEGATIVE
C Diff interpretation: NOT DETECTED
C Diff toxin: NEGATIVE

## 2022-07-22 MED ORDER — LATANOPROST 0.005 % OP SOLN
1.0000 [drp] | Freq: Every day | OPHTHALMIC | Status: DC
  Administered 2022-07-22 – 2022-07-28 (×7): 1 [drp] via OPHTHALMIC
  Filled 2022-07-22: qty 2.5

## 2022-07-22 MED ORDER — PNEUMOCOCCAL 20-VAL CONJ VACC 0.5 ML IM SUSY: 0.5000 mL | PREFILLED_SYRINGE | INTRAMUSCULAR | Status: DC | PRN

## 2022-07-22 NOTE — Plan of Care (Signed)

## 2022-07-22 NOTE — Progress Notes (Signed)
  Progress Note   Patient: Maria Fox NWG:[id number] DOB: 12-12-1945 DOA: 07/21/2022     0 DOS: the patient was seen and examined on 07/22/2022   Brief hospital course: Maria Fox is a 77 y.o. female with medical history significant for non-insulin-dependent type 2 diabetes, hypertension, chronic diastolic congestive heart failure, history of DVT on Eliquis presents with several days of nausea, diarrhea, abdominal discomfort being admitted to the hospital with UTI.  Patient still continues to have diarrhea but no nausea or vomiting.  Assessment and Plan: UTI (urinary tract infection)-most likely the source of her abdominal discomfort, nausea, and diarrhea.  No other source found, no acute findings in the abdomen and pelvis on CT scan today.  She is not septic. -Observation admission -Gentle IV fluids -Empiric IV Rocephin - Urine culture not sent, ordered  Diarrhea acute: - Stool culture, stool for C. difficile sent - Continue IV fluids at 50 ml/hr      Type 2 diabetes mellitus with neurological complications ([name])-diabetic diet as tolerated, with moderate dose sliding scale insulin    Stage 3 chronic kidney disease due to type 2 diabetes mellitus ([name])-renal function at baseline, monitor with daily labs, creatinine has improved.     Chronic diastolic CHF (congestive heart failure) ([name])-no evidence of acute heart failure, but monitor closely as she is being hydrated.  Holding  Lasix for now while on IV fluids.  Close monitoring for fluid overload     GERD (gastroesophageal reflux disease)-continue oral PPI     Hyperlipidemia associated with type 2 diabetes mellitus ([name])-continue home statin     Chronic anticoagulation-due to history of VTE, and paroxysmal atrial fibrillation, continue Eliquis     DVT prophylaxis: Lovenox       Subjective: Patient seen and examined at bedside today.  Patient continues to complain of diarrhea but no nausea abdominal pain.  She also denies  having any dysuria.  Physical Exam: Vitals:   07/21/22 1630 07/21/22 1951 07/22/22 0014 07/22/22 0519  BP: 130/75 125/68 118/61 131/65  Pulse: 68 (!) 102 94 87  Resp: 18 18 18 18   Temp: 98.1 F (36.7 C) 97.6 F (36.4 C) 97.6 F (36.4 C) 98.2 F (36.8 C)  TempSrc: Oral Oral Oral Oral  SpO2: 100% 100% 99% 98%  Weight:      Height:       General:  Alert, oriented, calm, clinically looks dry overall, he is uncomfortable due to abdominal pain.  However not acutely ill or toxic in appearance. Eyes: EOMI, clear conjuctivae, white sclerea Neck: supple, no masses, trachea mildline  Cardiovascular: RRR, no murmurs or rubs, no peripheral edema  Respiratory: clear to auscultation bilaterally, no wheezes, no crackles  Abdomen: soft, diffusely tender, nondistended, normal bowel tones heard  Skin: dry, no rashes  Musculoskeletal: no joint effusions, normal range of motion  Psychiatric: appropriate affect, normal speech  Neurologic: extraocular muscles intact, clear speech, moving all extremities with intact sensorium  Data Reviewed:  Reviewed CMP, creatinine improved  Family Communication: None at bedside  Disposition: Status is: Observation The patient will require care spanning > 2 midnights and should be moved to inpatient because: Persistent diarrhea  Planned Discharge Destination: Home    Time spent: 35 minutes  Author: Harold Hedge, MD 07/22/2022 11:35 AM  For on call review [email or web address].

## 2022-07-23 DIAGNOSIS — N3000 Acute cystitis without hematuria: Secondary | ICD-10-CM | POA: Diagnosis not present

## 2022-07-23 LAB — COMPREHENSIVE METABOLIC PANEL
ALT: 11 U/L (ref 0–44)
AST: 14 U/L — ABNORMAL LOW (ref 15–41)
Albumin: 2.9 g/dL — ABNORMAL LOW (ref 3.5–5.0)
Alkaline Phosphatase: 72 U/L (ref 38–126)
Anion gap: 7 (ref 5–15)
BUN: 15 mg/dL (ref 8–23)
CO2: 19 mmol/L — ABNORMAL LOW (ref 22–32)
Calcium: 8.4 mg/dL — ABNORMAL LOW (ref 8.9–10.3)
Chloride: 110 mmol/L (ref 98–111)
Creatinine, Ser: 1.2 mg/dL — ABNORMAL HIGH (ref 0.44–1.00)
GFR, Estimated: 46 mL/min — ABNORMAL LOW (ref >60)
Glucose, Bld: 366 mg/dL — ABNORMAL HIGH (ref 70–99)
Potassium: 4.3 mmol/L (ref 3.5–5.1)
Sodium: 136 mmol/L (ref 135–145)
Total Bilirubin: 0.5 mg/dL (ref 0.3–1.2)
Total Protein: 6.1 g/dL — ABNORMAL LOW (ref 6.5–8.1)

## 2022-07-23 LAB — GLUCOSE, CAPILLARY
Glucose-Capillary: 288 mg/dL — ABNORMAL HIGH (ref 70–99)
Glucose-Capillary: 291 mg/dL — ABNORMAL HIGH (ref 70–99)
Glucose-Capillary: 101 mg/dL — ABNORMAL HIGH (ref 70–99)
Glucose-Capillary: 182 mg/dL — ABNORMAL HIGH (ref 70–99)

## 2022-07-23 LAB — URINE CULTURE: Culture: NO GROWTH

## 2022-07-23 MED ORDER — INSULIN GLARGINE-YFGN 100 UNIT/ML ~~LOC~~ SOLN
8.0000 [IU] | Freq: Every day | SUBCUTANEOUS | Status: DC
  Administered 2022-07-23 – 2022-07-28 (×6): 8 [IU] via SUBCUTANEOUS
  Filled 2022-07-23 (×7): qty 0.08

## 2022-07-23 NOTE — Inpatient Diabetes Management (Signed)
Inpatient Diabetes Program Recommendations  AACE/ADA: New Consensus Statement on Inpatient Glycemic Control (2015)  Target Ranges:  Prepandial:   less than 140 mg/dL      Peak postprandial:   less than 180 mg/dL (1-2 hours)      Critically ill patients:  140 - 180 mg/dL   Lab Results  Component Value Date   GLUCAP 291 (H) 07/23/2022   HGBA1C 9.2 (H) 02/25/2022    Review of Glycemic Control  Diabetes history: DM2 Outpatient Diabetes medications: metformin 1000 mg QAM, glipizide 10 BID, Farxiga 5 mg QD Current orders for Inpatient glycemic control: Novolog 0-15 TID with meals and 0-5 HS  HgbA1C - 9.2%  Inpatient Diabetes Program Recommendations:    Consider adding Semglee 6 units QHS  Will continue to follow.  Thank you. Ailene Ards, RD, LDN, CDCES Inpatient Diabetes Coordinator [phone]

## 2022-07-23 NOTE — Progress Notes (Signed)
  Progress Note   Patient: Maria Fox ZHY:[id number] DOB: 1945/05/22 DOA: 07/21/2022     1 DOS: the patient was seen and examined on 07/23/2022   Brief hospital course: Maria Fox is a 77 y.o. female with medical history significant for non-insulin-dependent type 2 diabetes, hypertension, chronic diastolic congestive heart failure, history of DVT on Eliquis presents with several days of nausea, diarrhea, abdominal discomfort being admitted to the hospital with UTI.  Patient still continues to have diarrhea but no nausea or vomiting.  Assessment and Plan: UTI (urinary tract infection)-most likely the source of her abdominal discomfort, nausea, and diarrhea.  No other source found, no acute findings in the abdomen and pelvis on CT scan today.  She is not septic. -Observation admission -Gentle IV fluids -Empiric IV Rocephin - Urine culture not sent, ordered  Diarrhea acute: - Stool culture, stool for C. difficile is negative - Diarrhea frequency improving -Stool for pathogens panel ordered - Continue IV fluids at 50 ml/hr      Type 2 diabetes mellitus with neurological complications ([name])-diabetic diet as tolerated, with moderate dose sliding scale insulin. - Sugars not controlled start the patient on Lantus 8 units along with sliding scale insulin.    Stage 3 chronic kidney disease due to type 2 diabetes mellitus ([name])-renal function at baseline, monitor with daily labs, creatinine has improved.     Chronic diastolic CHF (congestive heart failure) ([name])-no evidence of acute heart failure, but monitor closely as she is being hydrated.  Holding  Lasix for now while on IV fluids.  Close monitoring for fluid overload     GERD (gastroesophageal reflux disease)-continue oral PPI     Hyperlipidemia associated with type 2 diabetes mellitus ([name])-continue home statin     Chronic anticoagulation-due to history of VTE, and paroxysmal atrial fibrillation, continue Eliquis     DVT prophylaxis:  Lovenox       Subjective: Patient seen and examined at bedside today.  Patient reports improvement in diarrhea frequency, denies having abdominal pain and nausea. Physical Exam: Vitals:   07/22/22 2010 07/23/22 0616 07/23/22 1043 07/23/22 1250  BP: 115/69 (!) 155/61 (!) 148/65 137/72  Pulse: 87 84 80 66  Resp: 18 18  15   Temp: 98 F (36.7 C) (!) 97.5 F (36.4 C)  97.6 F (36.4 C)  TempSrc: Oral Oral  Oral  SpO2: 99% 97%  100%  Weight:      Height:       General:  Alert, oriented, calm, clinically looks dry overall, he is uncomfortable due to abdominal pain.  However not acutely ill or toxic in appearance. Eyes: EOMI, clear conjuctivae, white sclerea Neck: supple, no masses, trachea mildline  Cardiovascular: RRR, no murmurs or rubs, no peripheral edema  Respiratory: clear to auscultation bilaterally, no wheezes, no crackles  Abdomen: soft, diffusely tender, nondistended, normal bowel tones heard  Skin: dry, no rashes  Musculoskeletal: no joint effusions, normal range of motion  Psychiatric: appropriate affect, normal speech  Neurologic: extraocular muscles intact, clear speech, moving all extremities with intact sensorium  Data Reviewed:  Reviewed CMP, creatinine improved  Family Communication: None at bedside  Disposition: Status is: Observation The patient will require care spanning > 2 midnights and should be moved to inpatient because: Persistent diarrhea  Planned Discharge Destination: Home    Time spent: 35 minutes  Author: Harold Hedge, MD 07/23/2022 1:46 PM  For on call review [email or web address].

## 2022-07-23 NOTE — Progress Notes (Signed)
Mobility Specialist - Progress Note   07/23/22 1317  Mobility  Activity Ambulated with assistance in hallway  Level of Assistance Standby assist, set-up cues, supervision of patient - no hands on  Assistive Device Front wheel walker  Distance Ambulated (ft) 500 ft  Range of Motion/Exercises Active  Activity Response Tolerated well  Mobility Referral Yes  $Mobility charge 1 Mobility  Mobility Specialist Start Time (ACUTE ONLY) 1300  Mobility Specialist Stop Time (ACUTE ONLY) 1316  Mobility Specialist Time Calculation (min) (ACUTE ONLY) 16 min   Pt received in chair and agreed to mobility. Had no issues throughout session, pt returned to bed with all needs met.  Marilynne Halsted Mobility Specialist

## 2022-07-24 DIAGNOSIS — N3001 Acute cystitis with hematuria: Secondary | ICD-10-CM | POA: Diagnosis not present

## 2022-07-24 LAB — COMPREHENSIVE METABOLIC PANEL
ALT: 10 U/L (ref 0–44)
AST: 12 U/L — ABNORMAL LOW (ref 15–41)
Albumin: 2.8 g/dL — ABNORMAL LOW (ref 3.5–5.0)
Alkaline Phosphatase: 56 U/L (ref 38–126)
Anion gap: 7 (ref 5–15)
BUN: 10 mg/dL (ref 8–23)
CO2: 21 mmol/L — ABNORMAL LOW (ref 22–32)
Calcium: 8.2 mg/dL — ABNORMAL LOW (ref 8.9–10.3)
Chloride: 109 mmol/L (ref 98–111)
Creatinine, Ser: 1.12 mg/dL — ABNORMAL HIGH (ref 0.44–1.00)
GFR, Estimated: 50 mL/min — ABNORMAL LOW (ref >60)
Glucose, Bld: 191 mg/dL — ABNORMAL HIGH (ref 70–99)
Potassium: 4.1 mmol/L (ref 3.5–5.1)
Sodium: 137 mmol/L (ref 135–145)
Total Bilirubin: 0.5 mg/dL (ref 0.3–1.2)
Total Protein: 5.9 g/dL — ABNORMAL LOW (ref 6.5–8.1)

## 2022-07-24 LAB — GLUCOSE, CAPILLARY
Glucose-Capillary: 211 mg/dL — ABNORMAL HIGH (ref 70–99)
Glucose-Capillary: 261 mg/dL — ABNORMAL HIGH (ref 70–99)
Glucose-Capillary: 112 mg/dL — ABNORMAL HIGH (ref 70–99)
Glucose-Capillary: 239 mg/dL — ABNORMAL HIGH (ref 70–99)

## 2022-07-24 MED ORDER — LIP MEDEX EX OINT
1.0000 | TOPICAL_OINTMENT | CUTANEOUS | Status: DC | PRN
  Administered 2022-07-24: 1 via TOPICAL
  Filled 2022-07-24: qty 7

## 2022-07-24 MED ORDER — GERHARDT'S BUTT CREAM
TOPICAL_CREAM | CUTANEOUS | Status: DC | PRN
  Filled 2022-07-24 (×2): qty 1

## 2022-07-24 MED ORDER — SACCHAROMYCES BOULARDII 250 MG PO CAPS
250.0000 mg | ORAL_CAPSULE | Freq: Two times a day (BID) | ORAL | Status: DC
  Administered 2022-07-24 – 2022-07-29 (×11): 250 mg via ORAL
  Filled 2022-07-24 (×11): qty 1

## 2022-07-24 NOTE — Evaluation (Signed)
Physical Therapy Evaluation Patient Details Name: Maria Fox MRN: [id number] DOB: September 14, 1945 Today's Date: 07/24/2022  History of Present Illness  77yo female admitted to the hospital with abdominal cramping, diarrhea, and abdominal discomfort. She was found to have UTI. PMH: DM II, HTN, chronic diastolic heart failure, DVT, CAD, CKD 3, CVA  Clinical Impression  On eval, pt was able to walk around the room while "furniture cruising" (pt walked in hallway earlier and had just returned to bed after working with OT). She reports she does not usually use an assistive device-has them if she feels she needs them. She also reports she had a fall recently. She has been mobilizing well with the mobility specialists. She could potentially benefit from continued rehab for balance training if she is agreeable. Will plan to follow pt during this hospital stay.        Recommendations for follow up therapy are one component of a multi-disciplinary discharge planning process, led by the attending physician.  Recommendations may be updated based on patient status, additional functional criteria and insurance authorization.  Follow Up Recommendations       Assistance Recommended at Discharge Intermittent Supervision/Assistance  Patient can return home with the following  Assistance with cooking/housework;Assist for transportation;Help with stairs or ramp for entrance    Equipment Recommendations None recommended by PT  Recommendations for Other Services       Functional Status Assessment Patient has had a recent decline in their functional status and demonstrates the ability to make significant improvements in function in a reasonable and predictable amount of time.     Precautions / Restrictions Precautions Precautions: Fall Restrictions Weight Bearing Restrictions: No      Mobility  Bed Mobility Overal bed mobility: Needs Assistance Bed Mobility: Supine to Sit, Sit to Supine     Supine to  sit: Min assist Sit to supine: Supervision   General bed mobility comments: Assist for trunk to upright. Increased time.    Transfers Overall transfer level: Needs assistance Equipment used: None Transfers: Sit to/from Stand Sit to Stand: Supervision           General transfer comment: Supv for safety    Ambulation/Gait Ambulation/Gait assistance: Min guard Gait Distance (Feet): 20 Feet Assistive device:  ("furniture cruising") Gait Pattern/deviations: Step-through pattern, Decreased stride length       General Gait Details: Pt walked 1 lap around room. Mild lightheadedness reported. No overt LOB.  Stairs            Wheelchair Mobility    Modified Rankin (Stroke Patients Only)       Balance Overall balance assessment: Needs assistance, History of Falls         Standing balance support: During functional activity Standing balance-Leahy Scale: Fair                               Pertinent Vitals/Pain Pain Assessment Pain Assessment: Faces Faces Pain Scale: Hurts little more Pain Location: R lower back/hip area Pain Descriptors / Indicators: Sore, Discomfort Pain Intervention(s): Limited activity within patient's tolerance, Monitored during session, Repositioned    Home Living Family/patient expects to be discharged to:: Private residence Living Arrangements: Spouse/significant other   Type of Home: House Home Access: Stairs to enter Entrance Stairs-Rails: Doctor, general practice of Steps: 2   Home Layout: One level Home Equipment: Agricultural consultant (2 wheels);BSC/3in1;Shower seat      Prior Function Prior Level of Function :  Independent/Modified Independent             Mobility Comments: She occasionally used a RW for ambulation, however infrequently. ADLs Comments: She reported being modified independent to independent with ADLs, her significant other managed the cooking & cleaning, and she does not drive.      Hand Dominance   Dominant Hand: Left    Extremity/Trunk Assessment   Upper Extremity Assessment Upper Extremity Assessment: Defer to OT evaluation RUE Deficits / Details: AROM and strength WFL LUE Deficits / Details: L wrist brace on, due to pt reports of sustaining a wrist fracture a couple months ago. Otherwise AROM WFL    Lower Extremity Assessment Lower Extremity Assessment: Generalized weakness    Cervical / Trunk Assessment Cervical / Trunk Assessment: Normal  Communication   Communication: No difficulties  Cognition Arousal/Alertness: Awake/alert Behavior During Therapy: WFL for tasks assessed/performed Overall Cognitive Status: Within Functional Limits for tasks assessed                                          General Comments      Exercises     Assessment/Plan    PT Assessment Patient needs continued PT services  PT Problem List Decreased strength;Decreased activity tolerance;Decreased mobility;Decreased knowledge of use of DME;Pain       PT Treatment Interventions DME instruction;Gait training;Therapeutic exercise;Balance training;Therapeutic activities;Patient/family education;Functional mobility training    PT Goals (Current goals can be found in the Care Plan section)  Acute Rehab PT Goals Patient Stated Goal: home. to get better. PT Goal Formulation: With patient Time For Goal Achievement: 08/07/22 Potential to Achieve Goals: Good    Frequency Min 1X/week     Co-evaluation               AM-PAC PT "6 Clicks" Mobility  Outcome Measure Help needed turning from your back to your side while in a flat bed without using bedrails?: None Help needed moving from lying on your back to sitting on the side of a flat bed without using bedrails?: None Help needed moving to and from a bed to a chair (including a wheelchair)?: None Help needed standing up from a chair using your arms (e.g., wheelchair or bedside chair)?: None Help  needed to walk in hospital room?: A Little Help needed climbing 3-5 steps with a railing? : A Little 6 Click Score: 22    End of Session   Activity Tolerance: Patient tolerated treatment well Patient left: in bed;with call bell/phone within reach;with bed alarm set   PT Visit Diagnosis: Muscle weakness (generalized) (M62.81);History of falling (Z91.81)    Time: 0865-7846 PT Time Calculation (min) (ACUTE ONLY): 10 min   Charges:   PT Evaluation $PT Eval Low Complexity: 1 Low             Faye Ramsay, PT Acute Rehabilitation  Office: [phone]

## 2022-07-24 NOTE — Evaluation (Signed)
Occupational Therapy Evaluation Patient Details Name: Maria Fox MRN: [id number] DOB: Mar 08, 1946 Today's Date: 07/24/2022   History of Present Illness Maria Fox is a 77 yr old female admitted to the hospital with abdominal cramping, diarrhea, and abdominal discomfort. Maria Fox was found to have UTI. PMH: DM II, HTN, chronic diastolic heart failure, DVT, CAD, CKD 3, CVA   Clinical Impression   The Maria Fox performed most tasks with SBA to occasional light min guard assist. Maria Fox reported feeling weaker than typical, and Maria Fox also appeared to be with intermittent unsteadiness in dynamic standing. Maria Fox appears to be presenting slightly below her baseline level of functioning for self-care management, therefore Maria Fox will benefit from further OT services in the acute setting, in order to maximize her functional independence and facilitate her safe return home. OT anticipates Maria Fox will be okay to return home with her significant other upon discharge.       Recommendations for follow up therapy are one component of a multi-disciplinary discharge planning process, led by the attending physician.  Recommendations may be updated based on patient status, additional functional criteria and insurance authorization.   Assistance Recommended at Discharge PRN  Patient can return home with the following Assistance with cooking/housework;Help with stairs or ramp for entrance;Assist for transportation    Functional Status Assessment  Patient has had a recent decline in their functional status and demonstrates the ability to make significant improvements in function in a reasonable and predictable amount of time.  Equipment Recommendations  None recommended by OT       Precautions / Restrictions Restrictions Weight Bearing Restrictions: No      Mobility Bed Mobility Overal bed mobility: Needs Assistance Bed Mobility: Supine to Sit, Sit to Supine     Supine to sit: Min guard Sit to supine: Supervision         Transfers Overall transfer level: Needs assistance   Transfers: Sit to/from Stand Sit to Stand: Supervision                  Balance       Sitting balance - Comments: good       Standing balance comment: SBA to occasional min guard in dynamic standing                           ADL either performed or assessed with clinical judgement   ADL Overall ADL's : Needs assistance/impaired Eating/Feeding: Independent Eating/Feeding Details (indicate cue type and reason): based on clinical judgement Grooming: Supervision/safety;Standing Grooming Details (indicate cue type and reason): Maria Fox performed hand washing in standing at sink level.         Upper Body Dressing : Set up;Sitting Upper Body Dressing Details (indicate cue type and reason): simulated seated EOB Lower Body Dressing: Supervision/safety Lower Body Dressing Details (indicate cue type and reason): Maria Fox perfomed sock management seated EOB. Toilet Transfer: Ambulation;Min guard   Psychiatrist and Hygiene: Supervision/safety;Sit to/from stand Toileting - Clothing Manipulation Details (indicate cue type and reason): Maria Fox performed anterior peri-hygiene in standing at bathroom level.             Vision Patient Visual Report: No change from baseline              Pertinent Vitals/Pain Pain Assessment Pain Assessment: No/denies pain     Hand Dominance Left   Extremity/Trunk Assessment Upper Extremity Assessment Upper Extremity Assessment: RUE deficits/detail;LUE deficits/detail RUE Deficits / Details: AROM and strength WFL LUE  Deficits / Details: L wrist brace on, due to Maria Fox reports of sustaining a wrist fracture a couple months ago. Otherwise AROM WFL   Lower Extremity Assessment Lower Extremity Assessment: Overall WFL for tasks assessed       Communication Communication Communication: No difficulties   Cognition Arousal/Alertness: Awake/alert Behavior During  Therapy: WFL for tasks assessed/performed Overall Cognitive Status: Within Functional Limits for tasks assessed          General Comments: Oriented to person, place, situation, and year; disoriented to month. Friendly, able to follow commands without difficulty                Home Living Family/patient expects to be discharged to:: Private residence Living Arrangements: Spouse/significant other   Type of Home: House Home Access: Stairs to enter [employer] of Steps: 2 Entrance Stairs-Rails: Right;Left Home Layout: One level     Bathroom Shower/Tub: Tub/shower unit         Home Equipment: Agricultural consultant (2 wheels);BSC/3in1;Shower seat          Prior Functioning/Environment Prior Level of Function : Independent/Modified Independent             Mobility Comments: Maria Fox occasionally used a RW for ambulation, however infrequently. ADLs Comments: Maria Fox reported being modified independent to independent with ADLs, her significant other managed the cooking & cleaning, and Maria Fox does not drive.        OT Problem List: Decreased strength;Impaired balance (sitting and/or standing)      OT Treatment/Interventions: Self-care/ADL training;Therapeutic exercise;Balance training;Energy conservation;Therapeutic activities;DME and/or AE instruction;Patient/family education    OT Goals(Current goals can be found in the care plan section) Acute Rehab OT Goals Patient Stated Goal: to return to normal activities OT Goal Formulation: With patient Time For Goal Achievement: 08/07/22 Potential to Achieve Goals: Good ADL Goals Maria Fox Will Perform Grooming: with modified independence;standing Maria Fox Will Perform Lower Body Dressing: with modified independence;sit to/from stand Maria Fox Will Transfer to Toilet: with modified independence;ambulating Maria Fox Will Perform Toileting - Clothing Manipulation and hygiene: with modified independence;sit to/from stand  OT Frequency: Min 1X/week        AM-PAC OT "6 Clicks" Daily Activity     Outcome Measure Help from another person eating meals?: None Help from another person taking care of personal grooming?: None Help from another person toileting, which includes using toliet, bedpan, or urinal?: A Little Help from another person bathing (including washing, rinsing, drying)?: A Little Help from another person to put on and taking off regular upper body clothing?: None Help from another person to put on and taking off regular lower body clothing?: A Little 6 Click Score: 21   End of Session Equipment Utilized During Treatment: Other (comment) (N/A) Nurse Communication: Mobility status  Activity Tolerance: Patient tolerated treatment well Patient left: in bed;with call bell/phone within reach;with bed alarm set  OT Visit Diagnosis: Unsteadiness on feet (R26.81);Muscle weakness (generalized) (M62.81)                Time: 6045-4098 OT Time Calculation (min): 22 min Charges:  OT General Charges $OT Visit: 1 Visit OT Evaluation $OT Eval Low Complexity: 1 Low   Omarii Scalzo L Cassell Voorhies, OTR/L 07/24/2022, 4:02 PM

## 2022-07-24 NOTE — Progress Notes (Signed)
PROGRESS NOTE    Maria Fox  ZOX:[id number] DOB: 1945/09/25 DOA: 07/21/2022 PCP: Eustaquio Boyden, MD   Brief Narrative: 60 past medical history significant for non-insulin-dependent diabetes type 2, hypertension, chronic diastolic heart failure, history of DVT on Eliquis presenting with several days of nausea, diarrhea, abdominal discomfort, admitted to the hospital for UTI.     Assessment & Plan:   Principal Problem:   UTI (urinary tract infection) Active Problems:   Type 2 diabetes mellitus with neurological complications ([name])   Stage 3 chronic kidney disease due to type 2 diabetes mellitus ([name])   Chronic diastolic CHF (congestive heart failure) ([name])   GERD (gastroesophageal reflux disease)   Hyperlipidemia associated with type 2 diabetes mellitus ([name])   Chronic anticoagulation   1-UTI: -Presents with abdominal pain, dysuria, UA with more than 50 WBC.  -Urine culture, no growth -Continue with IV ceftriaxone.   Diarrhea, Acute:  C diff negative, GI pathogen pending.  Start Florastore.  Improving.   DM type 2, hyperglycemia Continue with Semglee 8 units.  SSI.  Holding glipizide and metformin while in patient.   CKD stage IIIa Prior C r 1.4---1.09 Stable monitor.   Chronic Diastolic Heart failure:  Continue with Plavix, Toprol, Imdur.   GERD; PPI Hyperlipidemia: On lipitor.   History of VTE, paroxysmal A fib;  Continue with eliquis.      Estimated body mass index is 23.56 kg/m as calculated from the following:   Height as of this encounter: 5\' 3"  (1.6 m).   Weight as of this encounter: 60.3 kg.   DVT prophylaxis: Eliquis Code Status: Full code Family Communication: Care discussed with patient Disposition Plan:  Status is: Inpatient Remains inpatient appropriate because: management of diarrhea, UTI    Consultants:  None  Procedures:    Antimicrobials:    Subjective: She is feeling better today, still having dysuria.  Per nurse  BM today stools looks mushy.   Objective: Vitals:   07/23/22 1940 07/24/22 0522 07/24/22 1107 07/24/22 1324  BP: 133/61 (!) 148/83 (!) 140/80 121/63  Pulse: 76 83 80 72  Resp: 16 16    Temp: 97.7 F (36.5 C) (!) 97.5 F (36.4 C)  97.6 F (36.4 C)  TempSrc: Oral Oral  Oral  SpO2: 99% 99%  100%  Weight:      Height:        Intake/Output Summary (Last 24 hours) at 07/24/2022 1345 Last data filed at 07/24/2022 1300 Gross per 24 hour  Intake 480 ml  Output 300 ml  Net 180 ml   Filed Weights   07/21/22 0859  Weight: 60.3 kg    Examination:  General exam: Appears calm and comfortable  Respiratory system: Clear to auscultation. Respiratory effort normal. Cardiovascular system: S1 & S2 heard, RRR. No JVD, murmurs, rubs, gallops or clicks. No pedal edema. Gastrointestinal system: Abdomen is nondistended, soft and nontender. No organomegaly or masses felt. Normal bowel sounds heard. Central nervous system: Alert and oriented. Extremities: Symmetric 5 x 5 power.  Data Reviewed: I have personally reviewed following labs and imaging studies  CBC: Recent Labs  Lab 07/21/22 0926 07/22/22 0454  WBC 10.4 6.6  NEUTROABS 7.3  --   HGB 13.8 12.0  HCT 42.7 36.6  MCV 88.6 88.4  PLT 294 222   Basic Metabolic Panel: Recent Labs  Lab 07/21/22 0926 07/22/22 0454 07/23/22 0915 07/24/22 0511  NA 133* 134* 136 137  K 4.3 3.7 4.3 4.1  CL 101 107 110 109  CO2  14* 18* 19* 21*  GLUCOSE 285* 249* 366* 191*  BUN 22 19 15 10   CREATININE 1.32* 1.16* 1.20* 1.12*  CALCIUM 9.3 8.4* 8.4* 8.2*   GFR: Estimated Creatinine Clearance: 33.7 mL/min (A) (by C-G formula based on SCr of 1.12 mg/dL (H)). Liver Function Tests: Recent Labs  Lab 07/21/22 0926 07/23/22 0915 07/24/22 0511  AST 21 14* 12*  ALT 14 11 10   ALKPHOS 76 72 56  BILITOT 1.6* 0.5 0.5  PROT 7.7 6.1* 5.9*  ALBUMIN 3.7 2.9* 2.8*   Recent Labs  Lab 07/21/22 0926  LIPASE 31   No results for input(s): "AMMONIA" in the  last 168 hours. Coagulation Profile: No results for input(s): "INR", "PROTIME" in the last 168 hours. Cardiac Enzymes: No results for input(s): "CKTOTAL", "CKMB", "CKMBINDEX", "TROPONINI" in the last 168 hours. BNP (last 3 results) No results for input(s): "PROBNP" in the last 8760 hours. HbA1C: No results for input(s): "HGBA1C" in the last 72 hours. CBG: Recent Labs  Lab 07/23/22 1154 07/23/22 1655 07/23/22 1938 07/24/22 0748 07/24/22 1127  GLUCAP 291* 101* 182* 211* 261*   Lipid Profile: No results for input(s): "CHOL", "HDL", "LDLCALC", "TRIG", "CHOLHDL", "LDLDIRECT" in the last 72 hours. Thyroid Function Tests: No results for input(s): "TSH", "T4TOTAL", "FREET4", "T3FREE", "THYROIDAB" in the last 72 hours. Anemia Panel: No results for input(s): "VITAMINB12", "FOLATE", "FERRITIN", "TIBC", "IRON", "RETICCTPCT" in the last 72 hours. Sepsis Labs: No results for input(s): "PROCALCITON", "LATICACIDVEN" in the last 168 hours.  Recent Results (from the past 240 hour(s))  C Difficile Quick Screen w PCR reflex     Status: None   Collection Time: 07/22/22  9:05 AM   Specimen: STOOL  Result Value Ref Range Status   C Diff antigen NEGATIVE NEGATIVE Final   C Diff toxin NEGATIVE NEGATIVE Final   C Diff interpretation No C. difficile detected.  Final    Comment: Performed at [facility], [address], [place], [place] [address]  Urine Culture (for pregnant, neutropenic or urologic patients or patients with an indwelling urinary catheter)     Status: None   Collection Time: 07/22/22  3:01 PM   Specimen: Urine, Clean Catch  Result Value Ref Range Status   Specimen Description   Final    URINE, CLEAN CATCH Performed at [facility], [address] [place], [place] [address]    Special Requests   Final    NONE Performed at [facility], [address] [place], [place] [address]    Culture   Final    NO GROWTH Performed  at [facility], [address], [place], [place] [address]    Report Status 07/23/2022 FINAL  Final         Radiology Studies: No results found.      Scheduled Meds:  apixaban  5 mg Oral BID   atorvastatin  40 mg Oral QPM   clopidogrel  75 mg Oral Daily   dorzolamide-timolol  1 drop Both Eyes BID   DULoxetine  60 mg Oral Daily   gabapentin  300 mg Oral BID   insulin aspart  0-15 Units Subcutaneous TID WC   insulin aspart  0-5 Units Subcutaneous QHS   insulin glargine-yfgn  8 Units Subcutaneous QHS   isosorbide mononitrate  15 mg Oral Daily   latanoprost  1 drop Both Eyes QHS   metoprolol succinate  25 mg Oral Daily   pantoprazole  40 mg Oral BID  saccharomyces boulardii  250 mg Oral BID   Continuous Infusions:  sodium chloride 50 mL/hr at 07/22/22 2322   cefTRIAXone (ROCEPHIN)  IV 1 g (07/24/22 1256)     LOS: 2 days    Time spent: 35 minutes.     Alba Cory, MD [facility]   If 7PM-7AM, please contact night-coverage [email or web address]  07/24/2022, 1:45 PM

## 2022-07-24 NOTE — Progress Notes (Signed)
Mobility Specialist - Progress Note   07/24/22 1047  Mobility  Activity Ambulated with assistance in hallway  Level of Assistance Standby assist, set-up cues, supervision of patient - no hands on  Assistive Device Front wheel walker  Distance Ambulated (ft) 500 ft  Range of Motion/Exercises Active  Activity Response Tolerated well  Mobility Referral Yes  $Mobility charge 1 Mobility  Mobility Specialist Start Time (ACUTE ONLY) 1030  Mobility Specialist Stop Time (ACUTE ONLY) 1045  Mobility Specialist Time Calculation (min) (ACUTE ONLY) 15 min   Pt received in bed and agreed to mobility. Had some dizziness nearing midway point, pt had no other complaints.   Pt returned to bed with all needs met.  Marilynne Halsted Mobility Specialist

## 2022-07-25 DIAGNOSIS — N3001 Acute cystitis with hematuria: Secondary | ICD-10-CM | POA: Diagnosis not present

## 2022-07-25 LAB — BASIC METABOLIC PANEL
Anion gap: 7 (ref 5–15)
BUN: 11 mg/dL (ref 8–23)
CO2: 23 mmol/L (ref 22–32)
Calcium: 8.2 mg/dL — ABNORMAL LOW (ref 8.9–10.3)
Chloride: 108 mmol/L (ref 98–111)
Creatinine, Ser: 0.96 mg/dL (ref 0.44–1.00)
GFR, Estimated: >60 mL/min (ref >60)
Glucose, Bld: 207 mg/dL — ABNORMAL HIGH (ref 70–99)
Potassium: 3.9 mmol/L (ref 3.5–5.1)
Sodium: 138 mmol/L (ref 135–145)

## 2022-07-25 LAB — URINALYSIS, ROUTINE W REFLEX MICROSCOPIC
Bacteria, UA: NONE SEEN
Bilirubin Urine: NEGATIVE
Glucose, UA: NEGATIVE mg/dL
Ketones, ur: NEGATIVE mg/dL
Nitrite: NEGATIVE
Protein, ur: NEGATIVE mg/dL
RBC / HPF: >50 RBC/hpf (ref 0–5)
Specific Gravity, Urine: 1.014 (ref 1.005–1.030)
pH: 6 (ref 5.0–8.0)

## 2022-07-25 LAB — GLUCOSE, CAPILLARY
Glucose-Capillary: 216 mg/dL — ABNORMAL HIGH (ref 70–99)
Glucose-Capillary: 130 mg/dL — ABNORMAL HIGH (ref 70–99)
Glucose-Capillary: 165 mg/dL — ABNORMAL HIGH (ref 70–99)
Glucose-Capillary: 220 mg/dL — ABNORMAL HIGH (ref 70–99)

## 2022-07-25 LAB — CBC
HCT: 34.9 % — ABNORMAL LOW (ref 36.0–46.0)
Hemoglobin: 11 g/dL — ABNORMAL LOW (ref 12.0–15.0)
MCH: 28.7 pg (ref 26.0–34.0)
MCHC: 31.5 g/dL (ref 30.0–36.0)
MCV: 91.1 fL (ref 80.0–100.0)
Platelets: 191 10*3/uL (ref 150–400)
RBC: 3.83 MIL/uL — ABNORMAL LOW (ref 3.87–5.11)
RDW: 14.6 % (ref 11.5–15.5)
WBC: 4.3 10*3/uL (ref 4.0–10.5)
nRBC: 0 % (ref 0.0–0.2)

## 2022-07-25 MED ORDER — PHENAZOPYRIDINE HCL 100 MG PO TABS
100.0000 mg | ORAL_TABLET | Freq: Three times a day (TID) | ORAL | Status: DC | PRN
  Administered 2022-07-26: 100 mg via ORAL
  Filled 2022-07-25 (×2): qty 1

## 2022-07-25 NOTE — TOC Initial Note (Signed)
Transition of Care [facility]) - Initial/Assessment Note    Patient Details  Name: Maria Fox MRN: [id number] Date of Birth: 1945-07-28  Transition of Care [facility]) CM/SW Contact:    Otelia Santee, LCSW Phone Number: 07/25/2022, 10:13 AM  Clinical Narrative:                 Pt recommended for outpatient physical therapy. Pt to follow up with PCP for referral to OPPT. Information added to f/u.   Expected Discharge Plan: OP Rehab Barriers to Discharge: No Barriers Identified   Patient Goals and CMS Choice            Expected Discharge Plan and Services In-house Referral: NA Discharge Planning Services: NA Post Acute Care Choice: NA Living arrangements for the past 2 months: Single Family Home                 DME Arranged: N/A DME Agency: NA                  Prior Living Arrangements/Services Living arrangements for the past 2 months: Single Family Home Lives with:: Significant Other Patient language and need for interpreter reviewed:: Yes Do you feel safe going back to the place where you live?: Yes      Need for Family Participation in Patient Care: No (Comment) Care giver support system in place?: No (comment)   Criminal Activity/Legal Involvement Pertinent to Current Situation/Hospitalization: No - Comment as needed  Activities of Daily Living Home Assistive Devices/Equipment: None, Walker (specify type) ADL Screening (condition at time of admission) Patient's cognitive ability adequate to safely complete daily activities?: Yes Is the patient deaf or have difficulty hearing?: No Does the patient have difficulty seeing, even when wearing glasses/contacts?: No Does the patient have difficulty concentrating, remembering, or making decisions?: No Patient able to express need for assistance with ADLs?: No Does the patient have difficulty dressing or bathing?: No Independently performs ADLs?: Yes (appropriate for developmental age) Does the patient have  difficulty walking or climbing stairs?: No Weakness of Legs: Both Weakness of Arms/Hands: Both  Permission Sought/Granted   Permission granted to share information with : No              Emotional Assessment       Orientation: : Oriented to Self, Oriented to Place, Oriented to  Time, Oriented to Situation Alcohol / Substance Use: Not Applicable Psych Involvement: No (comment)  Admission diagnosis:  Dehydration [E86.0] UTI (urinary tract infection) [N39.0] Acute cystitis with hematuria [N30.01] AKI (acute kidney injury) ([name]) [N17.9] Diarrhea, unspecified type [R19.7] Patient Active Problem List   Diagnosis Date Noted   UTI (urinary tract infection) 07/21/2022   Chronic anticoagulation 07/21/2022   Anemia 04/05/2022   Lobar pneumonia ([name]) 03/02/2022   Dehydration, moderate 02/23/2022   Influenza A 02/23/2022   POAG (primary open-angle glaucoma) 01/04/2022   Memory deficit 11/07/2021   Functional abdominal pain syndrome 07/29/2020   Atypical chest pain 07/04/2020   Low-tension glaucoma, bilateral, severe stage 03/24/2020   Dysuria 11/08/2019   Right sided temporal headache 08/06/2019   Atrial flutter with rapid ventricular response ([name])    Acute deep vein thrombosis (DVT) of left tibial vein ([name]) 07/11/2019   Acute cholecystitis 07/07/2019   Pain of right breast 03/15/2019   Health maintenance examination 12/11/2018   Type 2 diabetes mellitus with mild nonproliferative diabetic retinopathy without macular edema, bilateral ([name]) 07/16/2018   Coronary artery disease of native artery of native heart with stable angina  pectoris ([name])    Effort angina 11/12/2017   Abnormal stress ECG    Dyspnea on exertion 11/04/2017   GERD (gastroesophageal reflux disease) 08/15/2017   Trigger finger 06/09/2017   Nocturnal leg movements 03/25/2017   Osteopenia 02/01/2017   Advanced care planning/counseling discussion 01/02/2017   Stage 3 chronic kidney disease due to type 2  diabetes mellitus ([name]) 08/26/2016   Insomnia 08/26/2016   Vitamin B12 deficiency 11/10/2015   Epistaxis 07/07/2015   Fatty liver disease, nonalcoholic 09/23/2014   Chronic diastolic CHF (congestive heart failure) ([name]) 08/27/2014   Positive hepatitis C antibody test 08/27/2014   Iron deficiency 08/27/2014   NSVT (nonsustained ventricular tachycardia) ([name])    Nausea without vomiting    Autonomic orthostatic hypotension 07/11/2014   Lower urinary tract infectious disease 07/08/2014   S/P CABG x 3 07/08/2014   History of cerebrovascular accident (CVA) in adulthood 07/08/2014   Hypokalemia 03/23/2014   Type 2 diabetes mellitus with neurological complications ([name]) 06/02/2012   Itching 03/16/2012   Rash 03/16/2012   Contact dermatitis 02/27/2012   Medicare annual wellness visit, initial 10/28/2011   Hyperlipidemia associated with type 2 diabetes mellitus ([name])    Idiopathic angioedema 06/23/2011   Vitiligo 06/23/2011   Dermatitis 06/23/2011   MDD (major depressive disorder), recurrent severe, without psychosis ([name]) 05/08/2006   Hypertension, essential 05/08/2006   MENOPAUSAL SYNDROME 05/08/2006   PCP:  Eustaquio Boyden, MD Pharmacy:   [facility] - [place], [place] - [address] [place] [address] Phone: [phone] Fax: [phone]     Social Determinants of Health (SDOH) Social History: SDOH Screenings   Food Insecurity: No Food Insecurity (07/21/2022)  Housing: Low Risk  (07/21/2022)  Transportation Needs: No Transportation Needs (07/21/2022)  Utilities: Not At Risk (07/21/2022)  Alcohol Screen: Low Risk  (10/25/2021)  Depression (PHQ2-9): Low Risk  (04/02/2022)  Financial Resource Strain: Low Risk  (10/25/2021)  Physical Activity: Inactive (10/25/2021)  Social Connections: Unknown (10/25/2021)  Stress: No Stress Concern Present (10/25/2021)  Tobacco Use: Low Risk  (07/21/2022)   SDOH Interventions:      Readmission Risk Interventions    07/25/2022   10:12 AM 02/26/2022   12:55 PM  Readmission Risk Prevention Plan  Transportation Screening Complete Complete  PCP or Specialist Appt within 5-7 Days Complete   Home Care Screening Complete   Medication Review (RN CM) Complete   HRI or Home Care Consult  Complete  Social Work Consult for Recovery Care Planning/Counseling  Complete  Palliative Care Screening  Not Applicable

## 2022-07-25 NOTE — Care Management Important Message (Signed)
Important Message  Patient Details IM Letter given Name: Maria Fox MRN: [id number] Date of Birth: 10-Sep-1945   Medicare Important Message Given:  Yes     Caren Macadam 07/25/2022, 11:40 AM

## 2022-07-25 NOTE — Inpatient Diabetes Management (Signed)
Inpatient Diabetes Program Recommendations  AACE/ADA: New Consensus Statement on Inpatient Glycemic Control (2015)  Target Ranges:  Prepandial:   less than 140 mg/dL      Peak postprandial:   less than 180 mg/dL (1-2 hours)      Critically ill patients:  140 - 180 mg/dL   Lab Results  Component Value Date   GLUCAP 216 (H) 07/25/2022   HGBA1C 9.2 (H) 02/25/2022    Review of Glycemic Control  Diabetes history: DM2 Outpatient Diabetes medications: metformin 1000 mg QAM, glipizide 10 BID, Farxiga 5 mg QD Current orders for Inpatient glycemic control: Semglee 6 units QHS, Novolog 0-15 TID with meals and 0-5 HS  HgbA1C - 9.2%  Inpatient Diabetes Program Recommendations:    Consider increasing Semglee to 8 units QHS  Continue to follow.  Thank you. Ailene Ards, RD, LDN, CDCES Inpatient Diabetes Coordinator [phone]

## 2022-07-25 NOTE — Progress Notes (Addendum)
PROGRESS NOTE    Maria Fox  ZOX:[id number] DOB: [birth year] DOA: 07/21/2022 PCP: Eustaquio Boyden, MD   Brief Narrative: 59 past medical history significant for non-insulin-dependent diabetes type 2, hypertension, chronic diastolic heart failure, history of DVT on Eliquis presenting with several days of nausea, diarrhea, abdominal discomfort, admitted to the hospital for UTI.     Assessment & Plan:   Principal Problem:   UTI (urinary tract infection) Active Problems:   Type 2 diabetes mellitus with neurological complications ([name])   Stage 3 chronic kidney disease due to type 2 diabetes mellitus ([name])   Chronic diastolic CHF (congestive heart failure) ([name])   GERD (gastroesophageal reflux disease)   Hyperlipidemia associated with type 2 diabetes mellitus ([name])   Chronic anticoagulation   1-UTI: -Presents with abdominal pain, dysuria, UA with more than 50 WBC.  -Urine culture, no growth -Continue with IV ceftriaxone.  She is still complaining of dysuria, will recheck UA, start pyridium PRN  Diarrhea, Acute:  C diff negative.  Started [employer]  Improved.   DM type 2, hyperglycemia Continue with Semglee 8 units.  SSI.  Holding glipizide and metformin while in patient.   CKD stage IIIa AKI has been ruled out  Prior C r 1.4---1.09 Stable monitor.   Chronic Diastolic Heart failure:  Continue with Plavix, Toprol, Imdur.  NSL fluids  GERD; PPI Hyperlipidemia: On lipitor.   History of VTE, paroxysmal A fib;  Continue with eliquis.      Estimated body mass index is 23.56 kg/m as calculated from the following:   Height as of this encounter: 5\' 3"  (1.6 m).   Weight as of this encounter: 60.3 kg.   DVT prophylaxis: Eliquis Code Status: Full code Family Communication: Care discussed with patient Disposition Plan:  Status is: Inpatient Remains inpatient appropriate because: management of diarrhea, UTI. Home tomorrow    Consultants:  None  Procedures:     Antimicrobials:    Subjective: She report dysuria. She walk twice yesterday. She report improvement of diarrhea.   Objective: Vitals:   07/24/22 1324 07/24/22 2024 07/25/22 0515 07/25/22 1324  BP: 121/63 130/70 (!) 150/65 135/72  Pulse: 72 76 75 72  Resp:  16 16 17   Temp: 97.6 F (36.4 C) 98.1 F (36.7 C) 97.8 F (36.6 C) (!) 97.4 F (36.3 C)  TempSrc: Oral Oral Oral Oral  SpO2: 100% 100% 97% 100%  Weight:      Height:        Intake/Output Summary (Last 24 hours) at 07/25/2022 1349 Last data filed at 07/24/2022 1740 Gross per 24 hour  Intake 240 ml  Output --  Net 240 ml    Filed Weights   07/21/22 0859  Weight: 60.3 kg    Examination:  General exam: NAD Respiratory system: CTA Cardiovascular system: S 1, S 2 RRR Gastrointestinal system: BS present, soft, nt Central nervous system: alert, non focal.  Extremities: No edema  Data Reviewed: I have personally reviewed following labs and imaging studies  CBC: Recent Labs  Lab 07/21/22 0926 07/22/22 0454 07/25/22 0720  WBC 10.4 6.6 4.3  NEUTROABS 7.3  --   --   HGB 13.8 12.0 11.0*  HCT 42.7 36.6 34.9*  MCV 88.6 88.4 91.1  PLT 294 222 191    Basic Metabolic Panel: Recent Labs  Lab 07/21/22 0926 07/22/22 0454 07/23/22 0915 07/24/22 0511 07/25/22 0728  NA 133* 134* 136 137 138  K 4.3 3.7 4.3 4.1 3.9  CL 101 107 110 109 108  CO2 14* 18* 19* 21* 23  GLUCOSE 285* 249* 366* 191* 207*  BUN 22 19 15 10 11   CREATININE 1.32* 1.16* 1.20* 1.12* 0.96  CALCIUM 9.3 8.4* 8.4* 8.2* 8.2*    GFR: Estimated Creatinine Clearance: 39.3 mL/min (by C-G formula based on SCr of 0.96 mg/dL). Liver Function Tests: Recent Labs  Lab 07/21/22 0926 07/23/22 0915 07/24/22 0511  AST 21 14* 12*  ALT 14 11 10   ALKPHOS 76 72 56  BILITOT 1.6* 0.5 0.5  PROT 7.7 6.1* 5.9*  ALBUMIN 3.7 2.9* 2.8*    Recent Labs  Lab 07/21/22 0926  LIPASE 31    No results for input(s): "AMMONIA" in the last 168  hours. Coagulation Profile: No results for input(s): "INR", "PROTIME" in the last 168 hours. Cardiac Enzymes: No results for input(s): "CKTOTAL", "CKMB", "CKMBINDEX", "TROPONINI" in the last 168 hours. BNP (last 3 results) No results for input(s): "PROBNP" in the last 8760 hours. HbA1C: No results for input(s): "HGBA1C" in the last 72 hours. CBG: Recent Labs  Lab 07/24/22 1127 07/24/22 1625 07/24/22 2023 07/25/22 0734 07/25/22 1136  GLUCAP 261* 112* 239* 216* 130*    Lipid Profile: No results for input(s): "CHOL", "HDL", "LDLCALC", "TRIG", "CHOLHDL", "LDLDIRECT" in the last 72 hours. Thyroid Function Tests: No results for input(s): "TSH", "T4TOTAL", "FREET4", "T3FREE", "THYROIDAB" in the last 72 hours. Anemia Panel: No results for input(s): "VITAMINB12", "FOLATE", "FERRITIN", "TIBC", "IRON", "RETICCTPCT" in the last 72 hours. Sepsis Labs: No results for input(s): "PROCALCITON", "LATICACIDVEN" in the last 168 hours.  Recent Results (from the past 240 hour(s))  C Difficile Quick Screen w PCR reflex     Status: None   Collection Time: 07/22/22  9:05 AM   Specimen: STOOL  Result Value Ref Range Status   C Diff antigen NEGATIVE NEGATIVE Final   C Diff toxin NEGATIVE NEGATIVE Final   C Diff interpretation No C. difficile detected.  Final    Comment: Performed at [facility], [address], [place], [place] [address]  Urine Culture (for pregnant, neutropenic or urologic patients or patients with an indwelling urinary catheter)     Status: None   Collection Time: 07/22/22  3:01 PM   Specimen: Urine, Clean Catch  Result Value Ref Range Status   Specimen Description   Final    URINE, CLEAN CATCH Performed at [facility], [address], [place], [place] [address]    Special Requests   Final    NONE Performed at [facility], [address] [place], [place] [address]    Culture   Final    NO GROWTH Performed at  [facility], [address] [place], [place] [address]    Report Status 07/23/2022 FINAL  Final         Radiology Studies: No results found.      Scheduled Meds:  apixaban  5 mg Oral BID   atorvastatin  40 mg Oral QPM   clopidogrel  75 mg Oral Daily   dorzolamide-timolol  1 drop Both Eyes BID   DULoxetine  60 mg Oral Daily   gabapentin  300 mg Oral BID   insulin aspart  0-15 Units Subcutaneous TID WC   insulin aspart  0-5 Units Subcutaneous QHS   insulin glargine-yfgn  8 Units Subcutaneous QHS   isosorbide mononitrate  15 mg Oral Daily   latanoprost  1 drop Both Eyes QHS   metoprolol succinate  25 mg Oral Daily  pantoprazole  40 mg Oral BID   saccharomyces boulardii  250 mg Oral BID   Continuous Infusions:     LOS: 3 days    Time spent: 35 minutes.     Alba Cory, MD [facility]   If 7PM-7AM, please contact night-coverage [email or web address]  07/25/2022, 1:49 PM

## 2022-07-26 ENCOUNTER — Inpatient Hospital Stay (HOSPITAL_COMMUNITY): Payer: 59

## 2022-07-26 DIAGNOSIS — N3001 Acute cystitis with hematuria: Secondary | ICD-10-CM | POA: Diagnosis not present

## 2022-07-26 LAB — GLUCOSE, CAPILLARY
Glucose-Capillary: 258 mg/dL — ABNORMAL HIGH (ref 70–99)
Glucose-Capillary: 273 mg/dL — ABNORMAL HIGH (ref 70–99)
Glucose-Capillary: 123 mg/dL — ABNORMAL HIGH (ref 70–99)
Glucose-Capillary: 206 mg/dL — ABNORMAL HIGH (ref 70–99)

## 2022-07-26 MED ORDER — SACCHAROMYCES BOULARDII 250 MG PO CAPS: 250.0000 mg | ORAL_CAPSULE | Freq: Two times a day (BID) | ORAL | 0 refills | Status: DC

## 2022-07-26 MED ORDER — SODIUM CHLORIDE 0.9 % IV SOLN
2.0000 g | INTRAVENOUS | Status: DC
  Administered 2022-07-26: 2 g via INTRAVENOUS
  Filled 2022-07-26: qty 20

## 2022-07-26 MED ORDER — CEPHALEXIN 500 MG PO CAPS: 500.0000 mg | ORAL_CAPSULE | Freq: Three times a day (TID) | ORAL | 0 refills | Status: AC

## 2022-07-26 MED ORDER — PHENAZOPYRIDINE HCL 100 MG PO TABS: 100.0000 mg | ORAL_TABLET | Freq: Three times a day (TID) | ORAL | 0 refills | Status: DC | PRN

## 2022-07-26 NOTE — Discharge Summary (Addendum)
Physician Discharge Summary   Patient: Maria Fox MRN: [id number] DOB: 1946-01-18  Admit date:     07/21/2022  Discharge date: 07/26/22  Discharge Physician: Alba Cory   PCP: Eustaquio Boyden, MD   Recommendations at discharge:    Needs follow up for resolution of UTI.   Discharge Diagnoses: Principal Problem:   UTI (urinary tract infection) Active Problems:   Type 2 diabetes mellitus with neurological complications ([name])   Stage 3 chronic kidney disease due to type 2 diabetes mellitus ([name])   Chronic diastolic CHF (congestive heart failure) ([name])   GERD (gastroesophageal reflux disease)   Hyperlipidemia associated with type 2 diabetes mellitus ([name])   Chronic anticoagulation  Resolved Problems:   * No resolved hospital problems. [facility] Course: 27 past medical history significant for non-insulin-dependent Diabetes type 2, Hypertension, Chronic diastolic heart failure, history of DVT on Eliquis presenting with several days of nausea, diarrhea, abdominal discomfort, admitted to the hospital for UTI.   Assessment and Plan: 1-UTI: -Presents with abdominal pain, dysuria, UA with more than 50 WBC.  -Urine culture, no growth -She was Treated  with IV ceftriaxone while in the hospital, received 5 days of antibiotics. She will be discharge on 3 more days.  -She is still complaining of dysuria, repeated  UA improved, less pyuria.  start pyridium PRN Plan to cancel discharge, patient with hematuria. Will continue with IV antibiotics and will get Renal US>   Diarrhea, Acute:  C diff negative.  Started [employer]  Improved.    DM type 2, hyperglycemia Continue with Semglee 8 units.  SSI.  Resume  glipizide and metformin at discharge.    CKD stage IIIa AKI has been ruled out  Prior C r 1.4---1.09 Stable monitor.    Chronic Diastolic Heart failure:  Continue with Plavix, Toprol, Imdur.  NSL fluids   GERD; PPI Hyperlipidemia: On lipitor.    History of VTE,  paroxysmal A fib;  Continue with eliquis.             Consultants: None Procedures performed: None Disposition: Home Diet recommendation:  Discharge Diet Orders (From admission, onward)     Start     Ordered   07/26/22 0000  Diet - low sodium heart healthy        07/26/22 1124           Carb modified diet DISCHARGE MEDICATION: Allergies as of 07/26/2022       Reactions   Bee Venom Anaphylaxis, Swelling, Other (See Comments)   Throat swelling   Mushroom Extract Complex Anaphylaxis   Penicillins Anaphylaxis, Hives, Swelling   Tolerates cephalosporins including cephalexin multiple times.  Has patient had a PCN reaction causing immediate rash, facial/tongue/throat swelling, SOB or lightheadedness with hypotension: Yes Has patient had a PCN reaction causing severe rash involving mucus membranes or skin necrosis: Yes Has patient had a PCN reaction that required hospitalization Yes Has patient had a PCN reaction occurring within the last 10 years: No   Codeine Nausea And Vomiting   Sulfa Antibiotics Nausea And Vomiting   Iohexol Itching, Swelling   Erythromycin Base Rash        Medication List     STOP taking these medications    ciprofloxacin 250 MG tablet Commonly known as: Cipro       TAKE these medications    albuterol 108 (90 Base) MCG/ACT inhaler Commonly known as: ProAir HFA Inhale 2 puffs into the lungs every 6 (six) hours as needed for wheezing  or shortness of breath.   atorvastatin 40 MG tablet Commonly known as: LIPITOR TAKE ONE TABLET BY MOUTH EVERY EVENING What changed: additional instructions   cephALEXin 500 MG capsule Commonly known as: KEFLEX Take 1 capsule (500 mg total) by mouth 3 (three) times daily for 3 days.   clopidogrel 75 MG tablet Commonly known as: PLAVIX Take 1 tablet (75 mg total) by mouth daily.   dapagliflozin propanediol 5 MG Tabs tablet Commonly known as: Farxiga Take 1 tablet (5 mg total) by mouth daily before  breakfast.   dorzolamide-timolol 2-0.5 % ophthalmic solution Commonly known as: COSOPT Place 1 drop into both eyes 2 (two) times daily.   DULoxetine 60 MG capsule Commonly known as: CYMBALTA Take 1 capsule (60 mg total) by mouth daily.   Eliquis 5 MG Tabs tablet Generic drug: apixaban TAKE ONE TABLET BY MOUTH TWICE DAILY   furosemide 20 MG tablet Commonly known as: LASIX Take 1 tablet (20 mg total) by mouth daily as needed for fluid or edema (leg swelling).   gabapentin 300 MG capsule Commonly known as: NEURONTIN TAKE ONE CAPSULE BY MOUTH TWICE DAILY   glipiZIDE 10 MG tablet Commonly known as: GLUCOTROL Take 1 tablet (10 mg total) by mouth 2 (two) times daily.   isosorbide mononitrate 30 MG 24 hr tablet Commonly known as: IMDUR TAKE 1/2 TABLET BY MOUTH ONCE DAILY   latanoprost 0.005 % ophthalmic solution Commonly known as: XALATAN Place 1 drop into both eyes at bedtime.   metFORMIN 500 MG 24 hr tablet Commonly known as: GLUCOPHAGE-XR TAKE TWO TABLETS BY MOUTH EVERY MORNING   metoprolol succinate 25 MG 24 hr tablet Commonly known as: Toprol XL Take 1 tablet (25 mg total) by mouth daily.   nitroGLYCERIN 0.4 MG SL tablet Commonly known as: NITROSTAT Dissolve 1 tab under tongue as needed for chest pain. May repeat every 5 minutes x 2 doses. If no relief call 9-1-1.   pantoprazole 40 MG tablet Commonly known as: PROTONIX TAKE ONE TABLET BY MOUTH TWICE DAILY   phenazopyridine 100 MG tablet Commonly known as: PYRIDIUM Take 1 tablet (100 mg total) by mouth 3 (three) times daily as needed (dysuria).   saccharomyces boulardii 250 MG capsule Commonly known as: FLORASTOR Take 1 capsule (250 mg total) by mouth 2 (two) times daily.        Follow-up Information     Eustaquio Boyden, MD. Schedule an appointment as soon as possible for a visit.   Specialty: Family Medicine Why: Please follow up with your primary care provider for referral to outpatient phyiscal  therapy. Contact information: [address] [place] [address] [phone]                Discharge Exam: Ceasar Mons Weights   07/21/22 0859  Weight: 60.3 kg   General; NAD  Condition at discharge: stable  The results of significant diagnostics from this hospitalization (including imaging, microbiology, ancillary and laboratory) are listed below for reference.   Imaging Studies: CT ABDOMEN PELVIS WO CONTRAST  Result Date: 07/21/2022 CLINICAL DATA:  Nonlocalized abdominal pain EXAM: CT ABDOMEN AND PELVIS WITHOUT CONTRAST TECHNIQUE: Multidetector CT imaging of the abdomen and pelvis was performed following the standard protocol without IV contrast. RADIATION DOSE REDUCTION: This exam was performed according to the departmental dose-optimization program which includes automated exposure control, adjustment of the mA and/or kV according to patient size and/or use of iterative reconstruction technique. COMPARISON:  04/26/2021 FINDINGS: Lower chest: Unremarkable. Hepatobiliary: No suspicious focal abnormality  in the liver on this study without intravenous contrast. Gallbladder is surgically absent. No intrahepatic or extrahepatic biliary dilation. Pancreas: No focal mass lesion. No dilatation of the main duct. No intraparenchymal cyst. No peripancreatic edema. Spleen: Calcified granulomata noted. Adrenals/Urinary Tract: No adrenal nodule or mass. Kidneys unremarkable. No evidence for hydroureter. The urinary bladder appears normal for the degree of distention. Stomach/Bowel: Stomach is unremarkable. No gastric wall thickening. No evidence of outlet obstruction. Duodenum is normally positioned as is the ligament of Treitz. No small bowel wall thickening. No small bowel dilatation. The terminal ileum is normal. The appendix is normal. No gross colonic mass. No colonic wall thickening. Diverticular changes are noted in the left colon without evidence of diverticulitis. Vascular/Lymphatic:  There is moderate atherosclerotic calcification of the abdominal aorta without aneurysm. There is no gastrohepatic or hepatoduodenal ligament lymphadenopathy. No retroperitoneal or mesenteric lymphadenopathy. No pelvic sidewall lymphadenopathy. Reproductive: Unremarkable. Other: No intraperitoneal free fluid. Musculoskeletal: No worrisome lytic or sclerotic osseous abnormality. IMPRESSION: 1. No acute findings in the abdomen or pelvis. Specifically, no findings to explain the patient's history of abdominal pain. 2. Left colonic diverticulosis without diverticulitis. 3.  Aortic Atherosclerosis (ICD10-I70.0). Electronically Signed   By: Kennith Center M.D.   On: 07/21/2022 10:56    Microbiology: Results for orders placed or performed during the hospital encounter of 07/21/22  C Difficile Quick Screen w PCR reflex     Status: None   Collection Time: 07/22/22  9:05 AM   Specimen: STOOL  Result Value Ref Range Status   C Diff antigen NEGATIVE NEGATIVE Final   C Diff toxin NEGATIVE NEGATIVE Final   C Diff interpretation No C. difficile detected.  Final    Comment: Performed at [facility], [address] [place], [place] [address]  Urine Culture (for pregnant, neutropenic or urologic patients or patients with an indwelling urinary catheter)     Status: None   Collection Time: 07/22/22  3:01 PM   Specimen: Urine, Clean Catch  Result Value Ref Range Status   Specimen Description   Final    URINE, CLEAN CATCH Performed at [facility], [address] [place], [place] [address]    Special Requests   Final    NONE Performed at [facility], [address] [place], [place] [address]    Culture   Final    NO GROWTH Performed at [facility], [address] [place], [place] [address]    Report Status 07/23/2022 FINAL  Final    Labs: CBC: Recent Labs  Lab 07/21/22 0926 07/22/22 0454 07/25/22 0720  WBC 10.4 6.6 4.3  NEUTROABS  7.3  --   --   HGB 13.8 12.0 11.0*  HCT 42.7 36.6 34.9*  MCV 88.6 88.4 91.1  PLT 294 222 191   Basic Metabolic Panel: Recent Labs  Lab 07/21/22 0926 07/22/22 0454 07/23/22 0915 07/24/22 0511 07/25/22 0728  NA 133* 134* 136 137 138  K 4.3 3.7 4.3 4.1 3.9  CL 101 107 110 109 108  CO2 14* 18* 19* 21* 23  GLUCOSE 285* 249* 366* 191* 207*  BUN 22 19 15 10 11   CREATININE 1.32* 1.16* 1.20* 1.12* 0.96  CALCIUM 9.3 8.4* 8.4* 8.2* 8.2*   Liver Function Tests: Recent Labs  Lab 07/21/22 0926 07/23/22 0915 07/24/22 0511  AST 21 14* 12*  ALT 14 11 10   ALKPHOS 76 72 56  BILITOT 1.6* 0.5 0.5  PROT 7.7 6.1* 5.9*  ALBUMIN 3.7 2.9* 2.8*   CBG: Recent Labs  Lab 07/25/22 0734 07/25/22 1136 07/25/22 1718 07/25/22 1939 07/26/22 0719  GLUCAP 216* 130* 165* 220* 258*    Discharge time spent: greater than 30 minutes.  Signed: Alba Cory, MD [facility] 07/26/2022

## 2022-07-26 NOTE — Progress Notes (Signed)
Physical Therapy Treatment Patient Details Name: Maria Fox MRN: [id number] DOB: 18-Oct-1945 Today's Date: 07/26/2022   History of Present Illness 77yo female admitted to the hospital with abdominal cramping, diarrhea, and abdominal discomfort. She was found to have UTI. PMH: DM II, HTN, chronic diastolic heart failure, DVT, CAD, CKD 3, CVA    PT Comments    Pt progressing however still with some higher level balance deficits. See below for areas reviewed. Discussed home safety and using RW for safety at home initially, until f/u therapy deems safe for less restrictive device.  Recommendations for follow up therapy are one component of a multi-disciplinary discharge planning process, led by the attending physician.  Recommendations may be updated based on patient status, additional functional criteria and insurance authorization.  Follow Up Recommendations       Assistance Recommended at Discharge Intermittent Supervision/Assistance  Patient can return home with the following Assistance with cooking/housework;Assist for transportation;Help with stairs or ramp for entrance   Equipment Recommendations  None recommended by PT    Recommendations for Other Services       Precautions / Restrictions Precautions Precautions: Fall Restrictions Weight Bearing Restrictions: No     Mobility  Bed Mobility Overal bed mobility: Modified Independent                  Transfers Overall transfer level: Needs assistance Equipment used: None Transfers: Sit to/from Stand Sit to Stand: Min guard, Supervision           General transfer comment: initially unsteady with min/guard for safety    Ambulation/Gait Ambulation/Gait assistance: Min guard Gait Distance (Feet): 250 Feet Assistive device: 1 person hand held assist, None Gait Pattern/deviations: Step-through pattern, Decreased stride length, Drifts right/left       General Gait Details: pt with unsteady gait, HHA/min to  min/guard for safety. drifting and with early steppage repsonse to LOB. pt admits to furniture surfing at home;   Social research officer, government Rankin (Stroke Patients Only)       Balance Overall balance assessment: Needs assistance, History of Falls Sitting-balance support: No upper extremity supported, Feet supported Sitting balance-Leahy Scale: Good     Standing balance support: During functional activity Standing balance-Leahy Scale: Fair Standing balance comment: min/guard to min for dynamic standing/gait activities             High level balance activites: Side stepping, Direction changes, Head turns, Turns, Backward walking High Level Balance Comments: min assist to min/guard for above            Cognition Arousal/Alertness: Awake/alert Behavior During Therapy: WFL for tasks assessed/performed Overall Cognitive Status: Within Functional Limits for tasks assessed                                          Exercises      General Comments        Pertinent Vitals/Pain Pain Assessment Faces Pain Scale: Hurts little more Pain Location: lower back Pain Descriptors / Indicators: Sore, Discomfort Pain Intervention(s): Limited activity within patient's tolerance, Monitored during session, Premedicated before session, Repositioned    Home Living                          Prior Function  PT Goals (current goals can now be found in the care plan section) Acute Rehab PT Goals Patient Stated Goal: home. to get better. PT Goal Formulation: With patient Time For Goal Achievement: 08/07/22 Potential to Achieve Goals: Good Progress towards PT goals: Progressing toward goals    Frequency    Min 1X/week      PT Plan Current plan remains appropriate    Co-evaluation              AM-PAC PT "6 Clicks" Mobility   Outcome Measure  Help needed turning from your back to your side while  in a flat bed without using bedrails?: None Help needed moving from lying on your back to sitting on the side of a flat bed without using bedrails?: None Help needed moving to and from a bed to a chair (including a wheelchair)?: None Help needed standing up from a chair using your arms (e.g., wheelchair or bedside chair)?: A Little Help needed to walk in hospital room?: A Little Help needed climbing 3-5 steps with a railing? : A Little 6 Click Score: 21    End of Session Equipment Utilized During Treatment: Gait belt Activity Tolerance: Patient tolerated treatment well Patient left: in bed;with call bell/phone within reach;with bed alarm set   PT Visit Diagnosis: Muscle weakness (generalized) (M62.81);History of falling (Z91.81)     Time: 1610-9604 PT Time Calculation (min) (ACUTE ONLY): 18 min  Charges:  $Gait Training: 8-22 mins                     Delice Bison, PT  Acute Rehab Dept [facility]) [phone]  07/26/2022    [facility] 07/26/2022, 1:46 PM

## 2022-07-27 DIAGNOSIS — R310 Gross hematuria: Secondary | ICD-10-CM

## 2022-07-27 DIAGNOSIS — N3001 Acute cystitis with hematuria: Secondary | ICD-10-CM | POA: Diagnosis not present

## 2022-07-27 LAB — BASIC METABOLIC PANEL
Anion gap: 8 (ref 5–15)
BUN: 16 mg/dL (ref 8–23)
CO2: 23 mmol/L (ref 22–32)
Calcium: 8.2 mg/dL — ABNORMAL LOW (ref 8.9–10.3)
Chloride: 105 mmol/L (ref 98–111)
Creatinine, Ser: 1.03 mg/dL — ABNORMAL HIGH (ref 0.44–1.00)
GFR, Estimated: 55 mL/min — ABNORMAL LOW (ref >60)
Glucose, Bld: 334 mg/dL — ABNORMAL HIGH (ref 70–99)
Potassium: 3.9 mmol/L (ref 3.5–5.1)
Sodium: 136 mmol/L (ref 135–145)

## 2022-07-27 LAB — CBC
HCT: 34.9 % — ABNORMAL LOW (ref 36.0–46.0)
Hemoglobin: 11.2 g/dL — ABNORMAL LOW (ref 12.0–15.0)
MCH: 29.1 pg (ref 26.0–34.0)
MCHC: 32.1 g/dL (ref 30.0–36.0)
MCV: 90.6 fL (ref 80.0–100.0)
Platelets: 182 10*3/uL (ref 150–400)
RBC: 3.85 MIL/uL — ABNORMAL LOW (ref 3.87–5.11)
RDW: 14.5 % (ref 11.5–15.5)
WBC: 5.3 10*3/uL (ref 4.0–10.5)
nRBC: 0 % (ref 0.0–0.2)

## 2022-07-27 LAB — GLUCOSE, CAPILLARY
Glucose-Capillary: 263 mg/dL — ABNORMAL HIGH (ref 70–99)
Glucose-Capillary: 189 mg/dL — ABNORMAL HIGH (ref 70–99)
Glucose-Capillary: 242 mg/dL — ABNORMAL HIGH (ref 70–99)
Glucose-Capillary: 188 mg/dL — ABNORMAL HIGH (ref 70–99)

## 2022-07-27 MED ORDER — PHENAZOPYRIDINE HCL 100 MG PO TABS: 100.0000 mg | ORAL_TABLET | Freq: Every day | ORAL | Status: DC | PRN

## 2022-07-27 MED ORDER — PHENAZOPYRIDINE HCL 100 MG PO TABS: 100.0000 mg | ORAL_TABLET | Freq: Two times a day (BID) | ORAL | Status: DC | PRN

## 2022-07-27 MED ORDER — DIPHENHYDRAMINE HCL 25 MG PO CAPS
25.0000 mg | ORAL_CAPSULE | Freq: Three times a day (TID) | ORAL | Status: DC | PRN
  Administered 2022-07-27: 25 mg via ORAL
  Filled 2022-07-27: qty 1

## 2022-07-27 MED ORDER — SODIUM CHLORIDE 0.9 % IV SOLN: INTRAVENOUS | Status: AC

## 2022-07-27 MED ORDER — ACETAMINOPHEN-CAFFEINE 500-65 MG PO TABS
2.0000 | ORAL_TABLET | Freq: Once | ORAL | Status: DC
  Filled 2022-07-27: qty 2

## 2022-07-27 MED ORDER — SODIUM CHLORIDE 0.9 % IV SOLN
2.0000 g | Freq: Two times a day (BID) | INTRAVENOUS | Status: DC
  Administered 2022-07-27 – 2022-07-29 (×5): 2 g via INTRAVENOUS
  Filled 2022-07-27 (×5): qty 12.5

## 2022-07-27 MED ORDER — SUMATRIPTAN SUCCINATE 50 MG PO TABS
50.0000 mg | ORAL_TABLET | Freq: Once | ORAL | Status: AC
  Administered 2022-07-27: 50 mg via ORAL
  Filled 2022-07-27: qty 1

## 2022-07-27 MED ORDER — PHENAZOPYRIDINE HCL 200 MG PO TABS: 200.0000 mg | ORAL_TABLET | Freq: Three times a day (TID) | ORAL | Status: DC

## 2022-07-27 MED ORDER — PHENAZOPYRIDINE HCL 100 MG PO TABS
100.0000 mg | ORAL_TABLET | Freq: Three times a day (TID) | ORAL | Status: DC
  Administered 2022-07-27: 100 mg via ORAL
  Filled 2022-07-27 (×2): qty 1
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
## 2022-07-27 NOTE — Progress Notes (Signed)
Pharmacy Antibiotic Note  Maria Fox is a 77 y.o. female admitted on 07/21/2022 with UTI.  Pharmacy has been consulted for cefepime dosing. Was planning for discharge to finish abx course for UTI but has worsening dysuria and hematuria today. Afebrile.  Has received 6 days of ceftriaxone, urine cx from 5/13 was no growth, Renal US is unremarkable.  Plan: Start cefepime 2g IV Q12h Monitor clinical picture, renal function F/U C&S, abx deescalation / LOT  Height: 5\' 3"  (160 cm) Weight: 60.3 kg (133 lb) IBW/kg (Calculated) : 52.4  Temp (24hrs), Avg:97.6 F (36.4 C), Min:97.5 F (36.4 C), Max:97.7 F (36.5 C)  Recent Labs  Lab 07/21/22 0926 07/22/22 0454 07/23/22 0915 07/24/22 0511 07/25/22 0720 07/25/22 0728  WBC 10.4 6.6  --   --  4.3  --   CREATININE 1.32* 1.16* 1.20* 1.12*  --  0.96    Estimated Creatinine Clearance: 39.3 mL/min (by C-G formula based on SCr of 0.96 mg/dL).    Allergies  Allergen Reactions   Bee Venom Anaphylaxis, Swelling and Other (See Comments)    Throat swelling   Mushroom Extract Complex Anaphylaxis   Penicillins Anaphylaxis, Hives and Swelling    Tolerates cephalosporins including cephalexin multiple times.  Has patient had a PCN reaction causing immediate rash, facial/tongue/throat swelling, SOB or lightheadedness with hypotension: Yes Has patient had a PCN reaction causing severe rash involving mucus membranes or skin necrosis: Yes Has patient had a PCN reaction that required hospitalization Yes Has patient had a PCN reaction occurring within the last 10 years: No     Codeine Nausea And Vomiting   Sulfa Antibiotics Nausea And Vomiting   Iohexol Itching and Swelling   Erythromycin Base Rash    Antimicrobials this admission: CTX 5/12 >> 5/17 Cefepime 5/18 >>  Dose adjustments this admission:   Microbiology results: 5/13 UCx: no growth 5/13 Cdiff negative   Thank you for allowing pharmacy to be a part of this patient's  care.  Armandina Stammer 07/27/2022 8:44 AM

## 2022-07-27 NOTE — Progress Notes (Signed)
PROGRESS NOTE    Maria Fox  WJX:[id number] DOB: 10-08-1945 DOA: 07/21/2022 PCP: Eustaquio Boyden, MD   Brief Narrative: 15 past medical history significant for non-insulin-dependent diabetes type 2, hypertension, chronic diastolic heart failure, history of DVT on Eliquis presenting with several days of nausea, diarrhea, abdominal discomfort, admitted to the hospital for UTI.     Assessment & Plan:   Principal Problem:   UTI (urinary tract infection) Active Problems:   Type 2 diabetes mellitus with neurological complications ([name])   Stage 3 chronic kidney disease due to type 2 diabetes mellitus ([name])   Chronic diastolic CHF (congestive heart failure) ([name])   GERD (gastroesophageal reflux disease)   Hyperlipidemia associated with type 2 diabetes mellitus ([name])   Chronic anticoagulation   1-UTI: -Presents with abdominal pain, dysuria, UA with more than 50 WBC.  -Urine culture, no growth -Complaining of persistent dysuria, develops hematuria yesterday prior to discharge.  Discussed with urology, hold eliquis, change antibiotics.  Started IV cefepime.  Careful with pyridium due to CKD>   Diarrhea, Acute:  C diff negative.  Started [employer]  Improved.   DM type 2, hyperglycemia Continue with Semglee 8 units.  SSI.  Holding glipizide and metformin while in patient.   CKD stage IIIa AKI has been ruled out  Prior C r 1.4---1.09 Stable monitor.   Chronic Diastolic Heart failure:  Continue with Plavix, Toprol, Imdur.  NSL fluids  GERD; PPI Hyperlipidemia: On lipitor.   History of VTE, paroxysmal A fib;  Hold eliquis.      Estimated body mass index is 23.56 kg/m as calculated from the following:   Height as of this encounter: 5\' 3"  (1.6 m).   Weight as of this encounter: 60.3 kg.   DVT prophylaxis: Eliquis Code Status: Full code Family Communication: Care discussed with patient Disposition Plan:  Status is: Inpatient Remains inpatient appropriate  because: management of diarrhea, UTI. Home tomorrow    Consultants:  None  Procedures:    Antimicrobials:    Subjective: Report dysuria. Develops hematuria yesterday   Objective: Vitals:   07/25/22 1941 07/26/22 1339 07/26/22 2052 07/27/22 0452  BP: (!) 141/70 136/71 (!) 146/76 (!) 156/76  Pulse: 78 68 71 65  Resp: 16 18 18 18   Temp: 98 F (36.7 C) (!) 97.5 F (36.4 C) 97.7 F (36.5 C) 97.6 F (36.4 C)  TempSrc: Oral Oral Oral Oral  SpO2: 100% 99% 98% 100%  Weight:      Height:       No intake or output data in the 24 hours ending 07/27/22 0835  Filed Weights   07/21/22 0859  Weight: 60.3 kg    Examination:  General exam: NAD Respiratory system: CTA Cardiovascular system: S 1, S 2 RRR Gastrointestinal system: BBS present , soft, nt Central nervous system: Alert, non focal.  Extremities: No edema  Data Reviewed: I have personally reviewed following labs and imaging studies  CBC: Recent Labs  Lab 07/21/22 0926 07/22/22 0454 07/25/22 0720  WBC 10.4 6.6 4.3  NEUTROABS 7.3  --   --   HGB 13.8 12.0 11.0*  HCT 42.7 36.6 34.9*  MCV 88.6 88.4 91.1  PLT 294 222 191    Basic Metabolic Panel: Recent Labs  Lab 07/21/22 0926 07/22/22 0454 07/23/22 0915 07/24/22 0511 07/25/22 0728  NA 133* 134* 136 137 138  K 4.3 3.7 4.3 4.1 3.9  CL 101 107 110 109 108  CO2 14* 18* 19* 21* 23  GLUCOSE 285* 249* 366* 191*  207*  BUN 22 19 15 10 11   CREATININE 1.32* 1.16* 1.20* 1.12* 0.96  CALCIUM 9.3 8.4* 8.4* 8.2* 8.2*    GFR: Estimated Creatinine Clearance: 39.3 mL/min (by C-G formula based on SCr of 0.96 mg/dL). Liver Function Tests: Recent Labs  Lab 07/21/22 0926 07/23/22 0915 07/24/22 0511  AST 21 14* 12*  ALT 14 11 10   ALKPHOS 76 72 56  BILITOT 1.6* 0.5 0.5  PROT 7.7 6.1* 5.9*  ALBUMIN 3.7 2.9* 2.8*    Recent Labs  Lab 07/21/22 0926  LIPASE 31    No results for input(s): "AMMONIA" in the last 168 hours. Coagulation Profile: No results for  input(s): "INR", "PROTIME" in the last 168 hours. Cardiac Enzymes: No results for input(s): "CKTOTAL", "CKMB", "CKMBINDEX", "TROPONINI" in the last 168 hours. BNP (last 3 results) No results for input(s): "PROBNP" in the last 8760 hours. HbA1C: No results for input(s): "HGBA1C" in the last 72 hours. CBG: Recent Labs  Lab 07/26/22 0719 07/26/22 1136 07/26/22 1657 07/26/22 2122 07/27/22 0745  GLUCAP 258* 273* 123* 206* 263*    Lipid Profile: No results for input(s): "CHOL", "HDL", "LDLCALC", "TRIG", "CHOLHDL", "LDLDIRECT" in the last 72 hours. Thyroid Function Tests: No results for input(s): "TSH", "T4TOTAL", "FREET4", "T3FREE", "THYROIDAB" in the last 72 hours. Anemia Panel: No results for input(s): "VITAMINB12", "FOLATE", "FERRITIN", "TIBC", "IRON", "RETICCTPCT" in the last 72 hours. Sepsis Labs: No results for input(s): "PROCALCITON", "LATICACIDVEN" in the last 168 hours.  Recent Results (from the past 240 hour(s))  C Difficile Quick Screen w PCR reflex     Status: None   Collection Time: 07/22/22  9:05 AM   Specimen: STOOL  Result Value Ref Range Status   C Diff antigen NEGATIVE NEGATIVE Final   C Diff toxin NEGATIVE NEGATIVE Final   C Diff interpretation No C. difficile detected.  Final    Comment: Performed at [facility], [address], [place], [place] [address]  Urine Culture (for pregnant, neutropenic or urologic patients or patients with an indwelling urinary catheter)     Status: None   Collection Time: 07/22/22  3:01 PM   Specimen: Urine, Clean Catch  Result Value Ref Range Status   Specimen Description   Final    URINE, CLEAN CATCH Performed at [facility], [address], [place], [place] [address]    Special Requests   Final    NONE Performed at [facility], [address] [place], [place] [address]    Culture   Final    NO GROWTH Performed at [facility], [address]  [place], [place] [address]    Report Status 07/23/2022 FINAL  Final         Radiology Studies: US RENAL  Result Date: 07/26/2022 CLINICAL DATA:  [id number] Hematuria [id number] EXAM: RENAL / URINARY TRACT ULTRASOUND COMPLETE COMPARISON:  None Available. FINDINGS: Right Kidney: Renal measurements: 9.8 x 4.0 x 4.1 cm = volume: 86.6 mL. No hydronephrosis. Echogenicity is within normal limits. No sonographic evidence of mass or nephrolithiasis. Left Kidney: Renal measurements: 11.5 x 4.6 x 4.3 cm = volume: 121.1 mL. No hydronephrosis. Echogenicity is within normal limits. Bladder: Minimally distended. Other: Limited exam due to body habitus. IMPRESSION: Unremarkable renal ultrasound. Electronically Signed   By: Caprice Renshaw M.D.   On: 07/26/2022 16:14        Scheduled Meds:  atorvastatin  40 mg Oral QPM   dorzolamide-timolol  1 drop Both Eyes BID  DULoxetine  60 mg Oral Daily   gabapentin  300 mg Oral BID   insulin aspart  0-15 Units Subcutaneous TID WC   insulin aspart  0-5 Units Subcutaneous QHS   insulin glargine-yfgn  8 Units Subcutaneous QHS   isosorbide mononitrate  15 mg Oral Daily   latanoprost  1 drop Both Eyes QHS   metoprolol succinate  25 mg Oral Daily   pantoprazole  40 mg Oral BID   saccharomyces boulardii  250 mg Oral BID   Continuous Infusions:     LOS: 5 days    Time spent: 35 minutes.     Alba Cory, MD [facility]   If 7PM-7AM, please contact night-coverage [email or web address]  07/27/2022, 8:35 AM

## 2022-07-28 DIAGNOSIS — N3001 Acute cystitis with hematuria: Secondary | ICD-10-CM | POA: Diagnosis not present

## 2022-07-28 LAB — GLUCOSE, CAPILLARY
Glucose-Capillary: 218 mg/dL — ABNORMAL HIGH (ref 70–99)
Glucose-Capillary: 261 mg/dL — ABNORMAL HIGH (ref 70–99)
Glucose-Capillary: 109 mg/dL — ABNORMAL HIGH (ref 70–99)
Glucose-Capillary: 165 mg/dL — ABNORMAL HIGH (ref 70–99)

## 2022-07-28 MED ORDER — SUMATRIPTAN SUCCINATE 50 MG PO TABS
50.0000 mg | ORAL_TABLET | Freq: Once | ORAL | Status: AC
  Administered 2022-07-28: 50 mg via ORAL
  Filled 2022-07-28: qty 1

## 2022-07-28 MED ORDER — FLUCONAZOLE 150 MG PO TABS
150.0000 mg | ORAL_TABLET | Freq: Once | ORAL | Status: AC
  Administered 2022-07-28: 150 mg via ORAL
  Filled 2022-07-28: qty 1

## 2022-07-28 NOTE — Progress Notes (Signed)
Urology Inpatient Progress Note  Subjective: Her dysuria is slightly improved.  Hematuria improving. She has vaginal itching which began yesterday. No fever or chills.  Anti-infectives: Anti-infectives (From admission, onward)    Start     Dose/Rate Route Frequency Ordered Stop   07/27/22 0930  ceFEPIme (MAXIPIME) 2 g in sodium chloride 0.9 % 100 mL IVPB        2 g 200 mL/hr over 30 Minutes Intravenous Every 12 hours 07/27/22 0844     07/26/22 1530  cefTRIAXone (ROCEPHIN) 2 g in sodium chloride 0.9 % 100 mL IVPB  Status:  Discontinued        2 g 200 mL/hr over 30 Minutes Intravenous Every 24 hours 07/26/22 1440 07/27/22 0833   07/26/22 0000  cephALEXin (KEFLEX) 500 MG capsule        500 mg Oral 3 times daily 07/26/22 1124 07/29/22 2359   07/22/22 1200  cefTRIAXone (ROCEPHIN) 1 g in sodium chloride 0.9 % 100 mL IVPB        1 g 200 mL/hr over 30 Minutes Intravenous Every 24 hours 07/21/22 1449 07/25/22 1159   07/21/22 1215  cefTRIAXone (ROCEPHIN) 1 g in sodium chloride 0.9 % 100 mL IVPB        1 g 200 mL/hr over 30 Minutes Intravenous  Once 07/21/22 1213 07/21/22 1250       Current Facility-Administered Medications  Medication Dose Route Frequency Provider Last Rate Last Admin   0.9 %  sodium chloride infusion   Intravenous Continuous Regalado, Belkys A, MD 50 mL/hr at 07/27/22 1339 New Bag at 07/27/22 1339   acetaminophen (TYLENOL) tablet 650 mg  650 mg Oral Q6H PRN Kirby Crigler, Mir M, MD   650 mg at 07/27/22 1329   Or   acetaminophen (TYLENOL) suppository 650 mg  650 mg Rectal Q6H PRN Kirby Crigler, Mir M, MD       albuterol (PROVENTIL) (2.5 MG/3ML) 0.083% nebulizer solution 2.5 mg  2.5 mg Nebulization Q2H PRN Kirby Crigler, Mir M, MD       atorvastatin (LIPITOR) tablet 40 mg  40 mg Oral QPM Kirby Crigler, Mir M, MD   40 mg at 07/27/22 1648   ceFEPIme (MAXIPIME) 2 g in sodium chloride 0.9 % 100 mL IVPB  2 g Intravenous Q12H Armandina Stammer, RPH 200 mL/hr at 07/27/22 2205 2 g at  07/27/22 2205   diphenhydrAMINE (BENADRYL) capsule 25 mg  25 mg Oral Q8H PRN Anthoney Harada, NP   25 mg at 07/27/22 2207   dorzolamide-timolol (COSOPT) 2-0.5 % ophthalmic solution 1 drop  1 drop Both Eyes BID Kirby Crigler, Mir M, MD   1 drop at 07/27/22 2215   DULoxetine (CYMBALTA) DR capsule 60 mg  60 mg Oral Daily Kirby Crigler, Mir M, MD   60 mg at 07/27/22 0900   gabapentin (NEURONTIN) capsule 300 mg  300 mg Oral BID Kirby Crigler, Mir M, MD   300 mg at 07/27/22 2206   Gerhardt's butt cream   Topical PRN Regalado, Belkys A, MD       insulin aspart (novoLOG) injection 0-15 Units  0-15 Units Subcutaneous TID WC Kirby Crigler, Mir M, MD   5 Units at 07/27/22 1648   insulin aspart (novoLOG) injection 0-5 Units  0-5 Units Subcutaneous QHS Kirby Crigler, Mir M, MD   2 Units at 07/26/22 2212   insulin glargine-yfgn (SEMGLEE) injection 8 Units  8 Units Subcutaneous QHS Harold Hedge, MD   8 Units at 07/27/22 2207   isosorbide mononitrate (IMDUR) 24 hr tablet  15 mg  15 mg Oral Daily Kirby Crigler, Mir M, MD   15 mg at 07/27/22 0900   latanoprost (XALATAN) 0.005 % ophthalmic solution 1 drop  1 drop Both Eyes QHS Harold Hedge, MD   1 drop at 07/27/22 2302   lip balm (CARMEX) ointment 1 Application  1 Application Topical PRN Regalado, Belkys A, MD   1 Application at 07/24/22 2207   metoprolol succinate (TOPROL-XL) 24 hr tablet 25 mg  25 mg Oral Daily Kirby Crigler, Mir M, MD   25 mg at 07/27/22 0900   metoprolol tartrate (LOPRESSOR) injection 5 mg  5 mg Intravenous Q6H PRN Kirby Crigler, Mir M, MD       morphine (PF) 2 MG/ML injection 1 mg  1 mg Intravenous Q3H PRN Kirby Crigler, Mir M, MD   1 mg at 07/21/22 1518   ondansetron (ZOFRAN) tablet 4 mg  4 mg Oral Q6H PRN Kirby Crigler, Mir M, MD   4 mg at 07/25/22 1610   Or   ondansetron (ZOFRAN) injection 4 mg  4 mg Intravenous Q6H PRN Kirby Crigler, Mir M, MD       pantoprazole (PROTONIX) EC tablet 40 mg  40 mg Oral BID Kirby Crigler, Mir M, MD   40 mg at 07/27/22 2207    phenazopyridine (PYRIDIUM) tablet 100 mg  100 mg Oral Daily PRN Regalado, Belkys A, MD       pneumococcal 20-valent conjugate vaccine (PREVNAR 20) injection 0.5 mL  0.5 mL Intramuscular Prior to discharge Harold Hedge, MD       saccharomyces boulardii (FLORASTOR) capsule 250 mg  250 mg Oral BID Regalado, Belkys A, MD   250 mg at 07/27/22 2207   traZODone (DESYREL) tablet 25 mg  25 mg Oral QHS PRN Kirby Crigler, Mir M, MD   25 mg at 07/27/22 2231     Objective: Vital signs in last 24 hours: Temp:  [97.6 F (36.4 C)-98 F (36.7 C)] 98 F (36.7 C) (05/19 0425) Pulse Rate:  [67-77] 77 (05/19 0425) Resp:  [16-20] 20 (05/19 0425) BP: (147-179)/(78-91) 179/91 (05/19 0425) SpO2:  [98 %-99 %] 98 % (05/19 0425)  Intake/Output from previous day: 05/18 0701 - 05/19 0700 In: 573.7 [P.O.:360; I.V.:113.7; IV Piggyback:100] Out: -  Intake/Output this shift: No intake/output data recorded.  GENERAL APPEARANCE:  Well appearing, well developed, well nourished, NAD HEENT:  Atraumatic, normocephalic, oropharynx clear NECK:  Supple without lymphadenopathy or thyromegaly ABDOMEN:  Soft, non-tender, no masses EXTREMITIES:  Moves all extremities well, without clubbing, cyanosis, or edema NEUROLOGIC:  Alert and oriented x 3, CN II-XII grossly intact MENTAL STATUS:  appropriate BACK:  Non-tender to palpation, No CVAT SKIN:  Warm, dry, and intact   Lab Results:  Recent Labs    07/25/22 0720 07/27/22 1009  WBC 4.3 5.3  HGB 11.0* 11.2*  HCT 34.9* 34.9*  PLT 191 182   BMET Recent Labs    07/25/22 0728 07/27/22 1009  NA 138 136  K 3.9 3.9  CL 108 105  CO2 23 23  GLUCOSE 207* 334*  BUN 11 16  CREATININE 0.96 1.03*  CALCIUM 8.2* 8.2*   PT/INR No results for input(s): "LABPROT", "INR" in the last 72 hours. ABG No results for input(s): "PHART", "HCO3" in the last 72 hours.  Invalid input(s): "PCO2", "PO2"  Studies/Results: US RENAL  Result Date: 07/26/2022 CLINICAL DATA:  [id number]  Hematuria [id number] EXAM: RENAL / URINARY TRACT ULTRASOUND COMPLETE COMPARISON:  None Available. FINDINGS: Right Kidney: Renal measurements: 9.8 x 4.0 x 4.1  cm = volume: 86.6 mL. No hydronephrosis. Echogenicity is within normal limits. No sonographic evidence of mass or nephrolithiasis. Left Kidney: Renal measurements: 11.5 x 4.6 x 4.3 cm = volume: 121.1 mL. No hydronephrosis. Echogenicity is within normal limits. Bladder: Minimally distended. Other: Limited exam due to body habitus. IMPRESSION: Unremarkable renal ultrasound. Electronically Signed   By: Caprice Renshaw M.D.   On: 07/26/2022 16:14     Assessment & Plan: UTI  Dysuria Gross hematuria Vaginal itching - ? Vaginal yeast infection secondary to antibiotics  She reports some improvement in her urinary symptoms this AM.  Her main complaint is vaginal itching. Continue antibiotics - consider transition to PO Continue Pyridium prn Fluconazole 150 mg x 1    Di Kindle, MD 07/28/2022

## 2022-07-28 NOTE — Progress Notes (Signed)
PROGRESS NOTE    Maria Fox  ZOX:[id number] DOB: Mar 26, 1945 DOA: 07/21/2022 PCP: Eustaquio Boyden, MD   Brief Narrative: 59 past medical history significant for non-insulin-dependent diabetes type 2, hypertension, chronic diastolic heart failure, history of DVT on Eliquis presenting with several days of nausea, diarrhea, abdominal discomfort, admitted to the hospital for UTI.     Assessment & Plan:   Principal Problem:   UTI (urinary tract infection) Active Problems:   Type 2 diabetes mellitus with neurological complications ([name])   Stage 3 chronic kidney disease due to type 2 diabetes mellitus ([name])   Chronic diastolic CHF (congestive heart failure) ([name])   GERD (gastroesophageal reflux disease)   Hyperlipidemia associated with type 2 diabetes mellitus ([name])   Chronic anticoagulation   Gross hematuria   1-UTI: -Presents with abdominal pain, dysuria, UA with more than 50 WBC.  -Urine culture, no growth -Complaining of persistent dysuria, develops hematuria yesterday prior to discharge.  Discussed with urology, hold eliquis, change antibiotics.  Started IV cefepime.  Careful with pyridium due to CKD>  Report improvement of hematuria, and dysuria.   Diarrhea, Acute:  C diff negative.  Started [employer]  Improved.   DM type 2, hyperglycemia Continue with Semglee 8 units.  SSI.  Holding glipizide and metformin while in patient.   CKD stage IIIa AKI has been ruled out  Prior C r 1.4---1.09 Stable monitor.   Chronic Diastolic Heart failure:  Continue with Plavix, Toprol, Imdur.  NSL fluids  GERD; PPI Hyperlipidemia: On lipitor.   History of VTE, paroxysmal A fib;  Hold eliquis.      Estimated body mass index is 23.56 kg/m as calculated from the following:   Height as of this encounter: 5\' 3"  (1.6 m).   Weight as of this encounter: 60.3 kg.   DVT prophylaxis: Eliquis Code Status: Full code Family Communication: Care discussed with  patient Disposition Plan:  Status is: Inpatient Remains inpatient appropriate because: management of diarrhea, UTI. Home tomorrow    Consultants:  None  Procedures:    Antimicrobials:    Subjective: Dysuria and hematuria improving.   Objective: Vitals:   07/27/22 1318 07/27/22 2138 07/28/22 0425 07/28/22 0632  BP: (!) 154/84 (!) 147/78 (!) 179/91 (!) 154/72  Pulse: 67 72 77 74  Resp: 16 18 20    Temp: 97.6 F (36.4 C) 97.6 F (36.4 C) 98 F (36.7 C)   TempSrc: Oral Oral Oral   SpO2: 99% 99% 98% 98%  Weight:      Height:        Intake/Output Summary (Last 24 hours) at 07/28/2022 1453 Last data filed at 07/27/2022 1739 Gross per 24 hour  Intake 573.66 ml  Output --  Net 573.66 ml    Filed Weights   07/21/22 0859  Weight: 60.3 kg    Examination:  General exam: NAD Respiratory system: CTA Cardiovascular system: S 1, S 2 RRR Gastrointestinal system: BS present, soft, nt Central nervous system: Alert, non focal.  Extremities: No edema  Data Reviewed: I have personally reviewed following labs and imaging studies  CBC: Recent Labs  Lab 07/22/22 0454 07/25/22 0720 07/27/22 1009  WBC 6.6 4.3 5.3  HGB 12.0 11.0* 11.2*  HCT 36.6 34.9* 34.9*  MCV 88.4 91.1 90.6  PLT 222 191 182    Basic Metabolic Panel: Recent Labs  Lab 07/22/22 0454 07/23/22 0915 07/24/22 0511 07/25/22 0728 07/27/22 1009  NA 134* 136 137 138 136  K 3.7 4.3 4.1 3.9 3.9  CL 107  110 109 108 105  CO2 18* 19* 21* 23 23  GLUCOSE 249* 366* 191* 207* 334*  BUN 19 15 10 11 16   CREATININE 1.16* 1.20* 1.12* 0.96 1.03*  CALCIUM 8.4* 8.4* 8.2* 8.2* 8.2*    GFR: Estimated Creatinine Clearance: 36.6 mL/min (A) (by C-G formula based on SCr of 1.03 mg/dL (H)). Liver Function Tests: Recent Labs  Lab 07/23/22 0915 07/24/22 0511  AST 14* 12*  ALT 11 10  ALKPHOS 72 56  BILITOT 0.5 0.5  PROT 6.1* 5.9*  ALBUMIN 2.9* 2.8*    No results for input(s): "LIPASE", "AMYLASE" in the last 168  hours.  No results for input(s): "AMMONIA" in the last 168 hours. Coagulation Profile: No results for input(s): "INR", "PROTIME" in the last 168 hours. Cardiac Enzymes: No results for input(s): "CKTOTAL", "CKMB", "CKMBINDEX", "TROPONINI" in the last 168 hours. BNP (last 3 results) No results for input(s): "PROBNP" in the last 8760 hours. HbA1C: No results for input(s): "HGBA1C" in the last 72 hours. CBG: Recent Labs  Lab 07/27/22 1137 07/27/22 1623 07/27/22 2057 07/28/22 0735 07/28/22 1149  GLUCAP 189* 242* 188* 218* 261*    Lipid Profile: No results for input(s): "CHOL", "HDL", "LDLCALC", "TRIG", "CHOLHDL", "LDLDIRECT" in the last 72 hours. Thyroid Function Tests: No results for input(s): "TSH", "T4TOTAL", "FREET4", "T3FREE", "THYROIDAB" in the last 72 hours. Anemia Panel: No results for input(s): "VITAMINB12", "FOLATE", "FERRITIN", "TIBC", "IRON", "RETICCTPCT" in the last 72 hours. Sepsis Labs: No results for input(s): "PROCALCITON", "LATICACIDVEN" in the last 168 hours.  Recent Results (from the past 240 hour(s))  C Difficile Quick Screen w PCR reflex     Status: None   Collection Time: 07/22/22  9:05 AM   Specimen: STOOL  Result Value Ref Range Status   C Diff antigen NEGATIVE NEGATIVE Final   C Diff toxin NEGATIVE NEGATIVE Final   C Diff interpretation No C. difficile detected.  Final    Comment: Performed at [facility], [address], [place], [place] [address]  Urine Culture (for pregnant, neutropenic or urologic patients or patients with an indwelling urinary catheter)     Status: None   Collection Time: 07/22/22  3:01 PM   Specimen: Urine, Clean Catch  Result Value Ref Range Status   Specimen Description   Final    URINE, CLEAN CATCH Performed at [facility], [address], [place], [place] [address]    Special Requests   Final    NONE Performed at [facility], [address] [place],  [place] [address]    Culture   Final    NO GROWTH Performed at [facility], [address], [place], [place] [address]    Report Status 07/23/2022 FINAL  Final         Radiology Studies: US RENAL  Result Date: 07/26/2022 CLINICAL DATA:  [id number] Hematuria [id number] EXAM: RENAL / URINARY TRACT ULTRASOUND COMPLETE COMPARISON:  None Available. FINDINGS: Right Kidney: Renal measurements: 9.8 x 4.0 x 4.1 cm = volume: 86.6 mL. No hydronephrosis. Echogenicity is within normal limits. No sonographic evidence of mass or nephrolithiasis. Left Kidney: Renal measurements: 11.5 x 4.6 x 4.3 cm = volume: 121.1 mL. No hydronephrosis. Echogenicity is within normal limits. Bladder: Minimally distended. Other: Limited exam due to body habitus. IMPRESSION: Unremarkable renal ultrasound. Electronically Signed   By: Caprice Renshaw M.D.   On: 07/26/2022 16:14        Scheduled Meds:  atorvastatin  40 mg Oral  QPM   dorzolamide-timolol  1 drop Both Eyes BID   DULoxetine  60 mg Oral Daily   gabapentin  300 mg Oral BID   insulin aspart  0-15 Units Subcutaneous TID WC   insulin aspart  0-5 Units Subcutaneous QHS   insulin glargine-yfgn  8 Units Subcutaneous QHS   isosorbide mononitrate  15 mg Oral Daily   latanoprost  1 drop Both Eyes QHS   metoprolol succinate  25 mg Oral Daily   pantoprazole  40 mg Oral BID   saccharomyces boulardii  250 mg Oral BID   Continuous Infusions:  ceFEPime (MAXIPIME) IV 2 g (07/28/22 0854)      LOS: 6 days    Time spent: 35 minutes.     Alba Cory, MD [facility]   If 7PM-7AM, please contact night-coverage [email or web address]  07/28/2022, 2:53 PM

## 2022-07-29 DIAGNOSIS — N3001 Acute cystitis with hematuria: Secondary | ICD-10-CM | POA: Diagnosis not present

## 2022-07-29 LAB — CBC
HCT: 36.1 % (ref 36.0–46.0)
Hemoglobin: 11.3 g/dL — ABNORMAL LOW (ref 12.0–15.0)
MCH: 29.1 pg (ref 26.0–34.0)
MCHC: 31.3 g/dL (ref 30.0–36.0)
MCV: 93 fL (ref 80.0–100.0)
Platelets: 190 10*3/uL (ref 150–400)
RBC: 3.88 MIL/uL (ref 3.87–5.11)
RDW: 14.6 % (ref 11.5–15.5)
WBC: 5.6 10*3/uL (ref 4.0–10.5)
nRBC: 0 % (ref 0.0–0.2)

## 2022-07-29 LAB — GLUCOSE, CAPILLARY
Glucose-Capillary: 219 mg/dL — ABNORMAL HIGH (ref 70–99)
Glucose-Capillary: 302 mg/dL — ABNORMAL HIGH (ref 70–99)
Glucose-Capillary: 287 mg/dL — ABNORMAL HIGH (ref 70–99)

## 2022-07-29 LAB — BASIC METABOLIC PANEL
Anion gap: 11 (ref 5–15)
BUN: 23 mg/dL (ref 8–23)
CO2: 19 mmol/L — ABNORMAL LOW (ref 22–32)
Calcium: 8.5 mg/dL — ABNORMAL LOW (ref 8.9–10.3)
Chloride: 105 mmol/L (ref 98–111)
Creatinine, Ser: 1.16 mg/dL — ABNORMAL HIGH (ref 0.44–1.00)
GFR, Estimated: 48 mL/min — ABNORMAL LOW (ref >60)
Glucose, Bld: 380 mg/dL — ABNORMAL HIGH (ref 70–99)
Potassium: 4.1 mmol/L (ref 3.5–5.1)
Sodium: 135 mmol/L (ref 135–145)

## 2022-07-29 MED ORDER — INSULIN GLARGINE-YFGN 100 UNIT/ML ~~LOC~~ SOLN: 10.0000 [IU] | Freq: Every day | SUBCUTANEOUS | 11 refills | Status: DC

## 2022-07-29 MED ORDER — INSULIN ASPART 100 UNIT/ML IJ SOLN: 2.0000 [IU] | Freq: Three times a day (TID) | INTRAMUSCULAR | Status: DC

## 2022-07-29 MED ORDER — PHENAZOPYRIDINE HCL 100 MG PO TABS: 100.0000 mg | ORAL_TABLET | Freq: Two times a day (BID) | ORAL | 0 refills | Status: DC | PRN

## 2022-07-29 MED ORDER — INSULIN GLARGINE-YFGN 100 UNIT/ML ~~LOC~~ SOLN
10.0000 [IU] | Freq: Every day | SUBCUTANEOUS | Status: DC
  Filled 2022-07-29: qty 0.1

## 2022-07-29 NOTE — Progress Notes (Signed)
  Subjective: Maria Fox is a 77 year old female seen in consultation for UTI, dysuria, and gross hematuria.  5/20: NAEON.  First time meeting patient this morning.  She was very pleasant.  She reports continued urgency and some burning  Objective: Vital signs in last 24 hours: Temp:  [97.7 F (36.5 C)-98.2 F (36.8 C)] 98.2 F (36.8 C) (05/20 0428) Pulse Rate:  [74-77] 74 (05/20 0428) Resp:  [18] 18 (05/20 0428) BP: (124-152)/(64-82) 124/64 (05/20 0428) SpO2:  [92 %-97 %] 95 % (05/20 0428)  Intake/Output from previous day: 05/19 0701 - 05/20 0700 In: 720 [P.O.:720] Out: -   Intake/Output this shift: No intake/output data recorded.  Physical Exam:  General: Alert and oriented CV: No cyanosis Lungs: equal chest rise Abdomen: Soft, NTND, no rebound or guarding Gu: hematuria improved but present, no sample available.   Lab Results: Recent Labs    07/27/22 1009  HGB 11.2*  HCT 34.9*   BMET Recent Labs    07/27/22 1009  NA 136  K 3.9  CL 105  CO2 23  GLUCOSE 334*  BUN 16  CREATININE 1.03*  CALCIUM 8.2*     Studies/Results: No results found.  Assessment/Plan: Eliquis on hold in the setting of likely hemorrhagic cystitis. Broad-spectrum ABX per primary. Patient reports improvement today, though no sample is available.  I have requested nursing save at least 1 sample per shift for reassessment tomorrow morning. Will follow along with you.   LOS: 7 days   Elmon Kirschner, NP [facility] Pager: [phone]  07/29/2022, 10:13 AM

## 2022-07-29 NOTE — Plan of Care (Signed)

## 2022-07-29 NOTE — Progress Notes (Signed)
Mobility Specialist - Progress Note   07/29/22 0949  Mobility  Activity Ambulated with assistance to bathroom  Level of Assistance Contact guard assist, steadying assist  Assistive Device None  Distance Ambulated (ft) 24 ft  Range of Motion/Exercises Active  Activity Response Tolerated well  Mobility Referral Yes  $Mobility charge 1 Mobility  Mobility Specialist Start Time (ACUTE ONLY) 0940  Mobility Specialist Stop Time (ACUTE ONLY) 0947  Mobility Specialist Time Calculation (min) (ACUTE ONLY) 7 min   Pt received in bed and requested assistance to restroom. Pt returned to bed with all needs met and alarm on.  Marilynne Halsted Mobility Specialist

## 2022-07-29 NOTE — Care Management Important Message (Signed)
Important Message  Patient Details IM Letter given. Name: IVAL MCCULLOGH MRN: [id number] Date of Birth: [birth year]   Medicare Important Message Given:  Yes     Caren Macadam 07/29/2022, 10:49 AM

## 2022-07-29 NOTE — Progress Notes (Signed)
Physical Therapy Treatment Patient Details Name: Maria Fox MRN: [id number] DOB: 1945-07-31 Today's Date: 07/29/2022   History of Present Illness 77yo female admitted to the hospital with abdominal cramping, diarrhea, and abdominal discomfort. She was found to have UTI. PMH: DM II, HTN, chronic diastolic heart failure, DVT, CAD, CKD 3, CVA    PT Comments    Pt making good progress. Does have mild gait instability - recommend RW at home.  Pt has support at home.  Continue plan of care.   Recommendations for follow up therapy are one component of a multi-disciplinary discharge planning process, led by the attending physician.  Recommendations may be updated based on patient status, additional functional criteria and insurance authorization.  Follow Up Recommendations       Assistance Recommended at Discharge Intermittent Supervision/Assistance  Patient can return home with the following Assistance with cooking/housework;Assist for transportation;Help with stairs or ramp for entrance   Equipment Recommendations  None recommended by PT    Recommendations for Other Services       Precautions / Restrictions Precautions Precautions: Fall Restrictions Weight Bearing Restrictions: No     Mobility  Bed Mobility Overal bed mobility: Modified Independent                  Transfers Overall transfer level: Needs assistance Equipment used: None Transfers: Sit to/from Stand             General transfer comment: Supervision ; performed x 3    Ambulation/Gait Ambulation/Gait assistance: Min guard Gait Distance (Feet): 400 Feet Assistive device: Rolling walker (2 wheels), None Gait Pattern/deviations: Step-through pattern Gait velocity: decreased     General Gait Details: Pt had no LOB but did drift R/L with gait. Tried with and w/o RW.  More drift without RW.  Encouraged RW use at home   Stairs             Wheelchair Mobility    Modified Rankin (Stroke  Patients Only)       Balance Overall balance assessment: Needs assistance, History of Falls Sitting-balance support: No upper extremity supported, Feet supported Sitting balance-Leahy Scale: Good     Standing balance support: During functional activity Standing balance-Leahy Scale: Good Standing balance comment: could take some steps without RW and perform ADLs                            Cognition Arousal/Alertness: Awake/alert Behavior During Therapy: WFL for tasks assessed/performed Overall Cognitive Status: Within Functional Limits for tasks assessed                                          Exercises Other Exercises Other Exercises: Held due to headache    General Comments        Pertinent Vitals/Pain Pain Assessment Pain Assessment: Faces Faces Pain Scale: Hurts even more Pain Location: headache Pain Descriptors / Indicators: Aching Pain Intervention(s): Limited activity within patient's tolerance, Monitored during session, Patient requesting pain meds-RN notified    Home Living                          Prior Function            PT Goals (current goals can now be found in the care plan section) Progress towards PT goals: Progressing toward goals  Frequency    Min 1X/week      PT Plan Current plan remains appropriate    Co-evaluation              AM-PAC PT "6 Clicks" Mobility   Outcome Measure  Help needed turning from your back to your side while in a flat bed without using bedrails?: None Help needed moving from lying on your back to sitting on the side of a flat bed without using bedrails?: None Help needed moving to and from a bed to a chair (including a wheelchair)?: A Little Help needed standing up from a chair using your arms (e.g., wheelchair or bedside chair)?: A Little Help needed to walk in hospital room?: A Little Help needed climbing 3-5 steps with a railing? : A Little 6 Click Score:  20    End of Session Equipment Utilized During Treatment: Gait belt Activity Tolerance: Patient tolerated treatment well Patient left: in bed;with call bell/phone within reach;with bed alarm set Nurse Communication: Mobility status PT Visit Diagnosis: Muscle weakness (generalized) (M62.81);History of falling (Z91.81)     Time: 4098-1191 PT Time Calculation (min) (ACUTE ONLY): 17 min  Charges:  $Gait Training: 8-22 mins                     Anise Salvo, PT Acute Rehab [facility] [phone]    Rayetta Humphrey 07/29/2022, 11:56 AM

## 2022-07-29 NOTE — Discharge Summary (Signed)
Physician Discharge Summary   Patient: Maria Fox MRN: [id number] DOB: 1945/08/05  Admit date:     07/21/2022  Discharge date: 07/29/22  Discharge Physician: Alba Cory   PCP: Eustaquio Boyden, MD   Recommendations at discharge:    Follow up resolution of cystitis.  Follow up with urology  Discharge Diagnoses: Principal Problem:   UTI (urinary tract infection) Active Problems:   Type 2 diabetes mellitus with neurological complications ([name])   Stage 3 chronic kidney disease due to type 2 diabetes mellitus ([name])   Chronic diastolic CHF (congestive heart failure) ([name])   GERD (gastroesophageal reflux disease)   Hyperlipidemia associated with type 2 diabetes mellitus ([name])   Chronic anticoagulation   Gross hematuria  Resolved Problems:   * No resolved hospital problems. [facility] Course: 94 past medical history significant for non-insulin-dependent diabetes type 2, hypertension, chronic diastolic heart failure, history of DVT on Eliquis presenting with several days of nausea, diarrhea, abdominal discomfort, admitted to the hospital for UTI.   Assessment and Plan: 1-UTI: -Presents with abdominal pain, dysuria, UA with more than 50 WBC.  -Urine culture, no growth. She continue to complaint of pain, dysuria. On physical exam there was not ulcer or sign of fungal infection genital area. Antibiotics was change to cefepime.  -Complaining of persistent dysuria, develops hematuria yesterday prior to discharge.  Discussed with urology, hold eliquis, change antibiotics.  Started IV cefepime. Discharge on keflex 3 more days.  Careful with pyridium due to CKD>  Report improvement of hematuria, and dysuria.    Diarrhea, Acute:  C diff negative.  Started [employer]  Improved.    DM type 2, hyperglycemia Continue with Semglee 8 units. Continue at discharge SSI.  Resume  glipizide and metformin at discharge. Hold jardiance at discharge due to UTI>    CKD stage IIIa AKI  has been ruled out  Prior C r 1.4---1.09 Stable monitor.    Chronic Diastolic Heart failure:  Continue with Plavix, Toprol, Imdur.  NSL fluids   GERD; PPI Hyperlipidemia: On lipitor.    History of VTE, paroxysmal A fib;  Resume eliquis tomorrow.             Consultants: Urology  Procedures performed: None Disposition: Home Diet recommendation:  Discharge Diet Orders (From admission, onward)     Start     Ordered   07/29/22 0000  Diet - low sodium heart healthy        07/29/22 1312   07/26/22 0000  Diet - low sodium heart healthy        07/26/22 1124           Carb modified diet DISCHARGE MEDICATION: Allergies as of 07/29/2022       Reactions   Bee Venom Anaphylaxis, Swelling, Other (See Comments)   Throat swelling   Mushroom Extract Complex Anaphylaxis   Penicillins Anaphylaxis, Hives, Swelling   Tolerates cephalosporins including cephalexin multiple times.  Has patient had a PCN reaction causing immediate rash, facial/tongue/throat swelling, SOB or lightheadedness with hypotension: Yes Has patient had a PCN reaction causing severe rash involving mucus membranes or skin necrosis: Yes Has patient had a PCN reaction that required hospitalization Yes Has patient had a PCN reaction occurring within the last 10 years: No   Codeine Nausea And Vomiting   Sulfa Antibiotics Nausea And Vomiting   Iohexol Itching, Swelling   Erythromycin Base Rash        Medication List     STOP taking these medications  ciprofloxacin 250 MG tablet Commonly known as: Cipro   dapagliflozin propanediol 5 MG Tabs tablet Commonly known as: Farxiga       TAKE these medications    albuterol 108 (90 Base) MCG/ACT inhaler Commonly known as: ProAir HFA Inhale 2 puffs into the lungs every 6 (six) hours as needed for wheezing or shortness of breath.   atorvastatin 40 MG tablet Commonly known as: LIPITOR TAKE ONE TABLET BY MOUTH EVERY EVENING What changed: additional  instructions   cephALEXin 500 MG capsule Commonly known as: KEFLEX Take 1 capsule (500 mg total) by mouth 3 (three) times daily for 3 days.   clopidogrel 75 MG tablet Commonly known as: PLAVIX Take 1 tablet (75 mg total) by mouth daily.   dorzolamide-timolol 2-0.5 % ophthalmic solution Commonly known as: COSOPT Place 1 drop into both eyes 2 (two) times daily.   DULoxetine 60 MG capsule Commonly known as: CYMBALTA Take 1 capsule (60 mg total) by mouth daily.   Eliquis 5 MG Tabs tablet Generic drug: apixaban TAKE ONE TABLET BY MOUTH TWICE DAILY   furosemide 20 MG tablet Commonly known as: LASIX Take 1 tablet (20 mg total) by mouth daily as needed for fluid or edema (leg swelling).   gabapentin 300 MG capsule Commonly known as: NEURONTIN TAKE ONE CAPSULE BY MOUTH TWICE DAILY   glipiZIDE 10 MG tablet Commonly known as: GLUCOTROL Take 1 tablet (10 mg total) by mouth 2 (two) times daily.   insulin glargine-yfgn 100 UNIT/ML injection Commonly known as: SEMGLEE Inject 0.1 mLs (10 Units total) into the skin at bedtime.   isosorbide mononitrate 30 MG 24 hr tablet Commonly known as: IMDUR TAKE 1/2 TABLET BY MOUTH ONCE DAILY   latanoprost 0.005 % ophthalmic solution Commonly known as: XALATAN Place 1 drop into both eyes at bedtime.   metFORMIN 500 MG 24 hr tablet Commonly known as: GLUCOPHAGE-XR TAKE TWO TABLETS BY MOUTH EVERY MORNING   metoprolol succinate 25 MG 24 hr tablet Commonly known as: Toprol XL Take 1 tablet (25 mg total) by mouth daily.   nitroGLYCERIN 0.4 MG SL tablet Commonly known as: NITROSTAT Dissolve 1 tab under tongue as needed for chest pain. May repeat every 5 minutes x 2 doses. If no relief call 9-1-1.   pantoprazole 40 MG tablet Commonly known as: PROTONIX TAKE ONE TABLET BY MOUTH TWICE DAILY   phenazopyridine 100 MG tablet Commonly known as: PYRIDIUM Take 1 tablet (100 mg total) by mouth 2 (two) times daily as needed (dysuria).    saccharomyces boulardii 250 MG capsule Commonly known as: FLORASTOR Take 1 capsule (250 mg total) by mouth 2 (two) times daily.        Follow-up Information     Eustaquio Boyden, MD. Schedule an appointment as soon as possible for a visit.   Specialty: Family Medicine Why: Please follow up with your primary care provider for referral to outpatient phyiscal therapy. Contact information: [address] [place] [address] [phone]         Eustaquio Boyden, MD Follow up in 1 week(s).   Specialty: Family Medicine Contact information: [address] [place] [address] [phone]                Discharge Exam: Ceasar Mons Weights   07/21/22 0859  Weight: 60.3 kg   General; NAD  Condition at discharge: stable  The results of significant diagnostics from this hospitalization (including imaging, microbiology, ancillary and laboratory) are listed below for reference.  Imaging Studies: US RENAL  Result Date: 07/26/2022 CLINICAL DATA:  [id number] Hematuria [id number] EXAM: RENAL / URINARY TRACT ULTRASOUND COMPLETE COMPARISON:  None Available. FINDINGS: Right Kidney: Renal measurements: 9.8 x 4.0 x 4.1 cm = volume: 86.6 mL. No hydronephrosis. Echogenicity is within normal limits. No sonographic evidence of mass or nephrolithiasis. Left Kidney: Renal measurements: 11.5 x 4.6 x 4.3 cm = volume: 121.1 mL. No hydronephrosis. Echogenicity is within normal limits. Bladder: Minimally distended. Other: Limited exam due to body habitus. IMPRESSION: Unremarkable renal ultrasound. Electronically Signed   By: Caprice Renshaw M.D.   On: 07/26/2022 16:14   CT ABDOMEN PELVIS WO CONTRAST  Result Date: 07/21/2022 CLINICAL DATA:  Nonlocalized abdominal pain EXAM: CT ABDOMEN AND PELVIS WITHOUT CONTRAST TECHNIQUE: Multidetector CT imaging of the abdomen and pelvis was performed following the standard protocol without IV contrast. RADIATION DOSE REDUCTION: This exam was performed  according to the departmental dose-optimization program which includes automated exposure control, adjustment of the mA and/or kV according to patient size and/or use of iterative reconstruction technique. COMPARISON:  04/26/2021 FINDINGS: Lower chest: Unremarkable. Hepatobiliary: No suspicious focal abnormality in the liver on this study without intravenous contrast. Gallbladder is surgically absent. No intrahepatic or extrahepatic biliary dilation. Pancreas: No focal mass lesion. No dilatation of the main duct. No intraparenchymal cyst. No peripancreatic edema. Spleen: Calcified granulomata noted. Adrenals/Urinary Tract: No adrenal nodule or mass. Kidneys unremarkable. No evidence for hydroureter. The urinary bladder appears normal for the degree of distention. Stomach/Bowel: Stomach is unremarkable. No gastric wall thickening. No evidence of outlet obstruction. Duodenum is normally positioned as is the ligament of Treitz. No small bowel wall thickening. No small bowel dilatation. The terminal ileum is normal. The appendix is normal. No gross colonic mass. No colonic wall thickening. Diverticular changes are noted in the left colon without evidence of diverticulitis. Vascular/Lymphatic: There is moderate atherosclerotic calcification of the abdominal aorta without aneurysm. There is no gastrohepatic or hepatoduodenal ligament lymphadenopathy. No retroperitoneal or mesenteric lymphadenopathy. No pelvic sidewall lymphadenopathy. Reproductive: Unremarkable. Other: No intraperitoneal free fluid. Musculoskeletal: No worrisome lytic or sclerotic osseous abnormality. IMPRESSION: 1. No acute findings in the abdomen or pelvis. Specifically, no findings to explain the patient's history of abdominal pain. 2. Left colonic diverticulosis without diverticulitis. 3.  Aortic Atherosclerosis (ICD10-I70.0). Electronically Signed   By: Kennith Center M.D.   On: 07/21/2022 10:56    Microbiology: Results for orders placed or  performed during the hospital encounter of 07/21/22  C Difficile Quick Screen w PCR reflex     Status: None   Collection Time: 07/22/22  9:05 AM   Specimen: STOOL  Result Value Ref Range Status   C Diff antigen NEGATIVE NEGATIVE Final   C Diff toxin NEGATIVE NEGATIVE Final   C Diff interpretation No C. difficile detected.  Final    Comment: Performed at [facility], [address], [place], [place] [address]  Urine Culture (for pregnant, neutropenic or urologic patients or patients with an indwelling urinary catheter)     Status: None   Collection Time: 07/22/22  3:01 PM   Specimen: Urine, Clean Catch  Result Value Ref Range Status   Specimen Description   Final    URINE, CLEAN CATCH Performed at [facility], [address], [place], [place] [address]    Special Requests   Final    NONE Performed at [facility], [address], [place], [place] [address]    Culture   Final  NO GROWTH Performed at [facility], [address] [place], [place] [address]    Report Status 07/23/2022 FINAL  Final    Labs: CBC: Recent Labs  Lab 07/25/22 0720 07/27/22 1009 07/29/22 1005  WBC 4.3 5.3 5.6  HGB 11.0* 11.2* 11.3*  HCT 34.9* 34.9* 36.1  MCV 91.1 90.6 93.0  PLT 191 182 190   Basic Metabolic Panel: Recent Labs  Lab 07/23/22 0915 07/24/22 0511 07/25/22 0728 07/27/22 1009 07/29/22 1005  NA 136 137 138 136 135  K 4.3 4.1 3.9 3.9 4.1  CL 110 109 108 105 105  CO2 19* 21* 23 23 19*  GLUCOSE 366* 191* 207* 334* 380*  BUN 15 10 11 16 23   CREATININE 1.20* 1.12* 0.96 1.03* 1.16*  CALCIUM 8.4* 8.2* 8.2* 8.2* 8.5*   Liver Function Tests: Recent Labs  Lab 07/23/22 0915 07/24/22 0511  AST 14* 12*  ALT 11 10  ALKPHOS 72 56  BILITOT 0.5 0.5  PROT 6.1* 5.9*  ALBUMIN 2.9* 2.8*   CBG: Recent Labs  Lab 07/28/22 1149 07/28/22 1640 07/28/22 2040 07/29/22 0743 07/29/22 1215  GLUCAP 261* 109* 165* 219* 302*     Discharge time spent: greater than 30 minutes.  Signed: Alba Cory, MD [facility] 07/29/2022

## 2022-07-29 NOTE — Consult Note (Signed)
   [facility] Inpatient Consult   07/29/2022  Maria Fox May 17, 1945 [id number]  [facility] [[facility]]  Accountable Care Organization [ACO] Patient: [employer] Medicare [Dual]  *[facility] Liaison remote coverage review for patient admitted to [facility]    Primary Care Provider:  Eustaquio Boyden, MD with [facility] is listed to provide the transition of care follow up   Patient screened for hospitalization with noted high risk score for unplanned readmission risk 8 daylength of stay and  to assess for potential [facility]  [[facility]] Care Management service needs for post hospital transition for care coordination.  Review of patient's electronic medical record reveals patient has been active with care coordination noted in encounters with [facility] with Pharmacy for medications.  Inpatient TOC team notes patient for OP rehab.     Plan:  No needs assessed for [facility] Referral request for community care coordination: anticipate ongoing follow up with [facility] and [facility] TOC follow up call.  Of note, [facility] Care Management/Population Health does not replace or interfere with any arrangements made by the Inpatient Transition of Care team.  For questions contact:   Charlesetta Shanks, RN BSN CCM [facility]  [phone] business mobile phone Toll free office [phone]  *Concierge Line  [phone] Fax number: [phone] [email or web address]

## 2022-07-30 ENCOUNTER — Telehealth: Payer: Self-pay | Admitting: *Deleted

## 2022-07-30 ENCOUNTER — Ambulatory Visit: Payer: Self-pay

## 2022-07-30 NOTE — Transitions of Care (Post Inpatient/ED Visit) (Signed)
   07/30/2022  Name: Maria Fox MRN: [id number] DOB: [birth year]  Today's TOC FU Call Status: Today's TOC FU Call Status:: Unsuccessul Call (1st Attempt) Unsuccessful Call (1st Attempt) Date: 07/30/22  Attempted to reach the patient regarding the most recent Inpatient/ED visit.  Follow Up Plan: Additional outreach attempts will be made to reach the patient to complete the Transitions of Care (Post Inpatient/ED visit) call.   Gean Maidens BSN RN [facility] [phone].

## 2022-07-30 NOTE — Chronic Care Management (AMB) (Signed)
   07/30/2022  Maria Fox 09-29-1945 [id number]   Reason for Encounter: Patient is not currently enrolled in the CCM program. CCM status changed to previously enrolled  .p

## 2022-08-01 ENCOUNTER — Telehealth: Payer: Self-pay | Admitting: *Deleted

## 2022-08-01 NOTE — Transitions of Care (Post Inpatient/ED Visit) (Signed)
   08/01/2022  Name: Maria Fox MRN: [id number] DOB: 04/20/1945  Today's TOC FU Call Status: Today's TOC FU Call Status:: Unsuccessful Call (2nd Attempt) Unsuccessful Call (2nd Attempt) Date: 08/01/22  Attempted to reach the patient regarding the most recent Inpatient/ED visit.  Follow Up Plan: Additional outreach attempts will be made to reach the patient to complete the Transitions of Care (Post Inpatient/ED visit) call.   Gean Maidens BSN RN [facility] [phone]

## 2022-08-06 ENCOUNTER — Telehealth: Payer: Self-pay | Admitting: *Deleted

## 2022-08-06 NOTE — Transitions of Care (Post Inpatient/ED Visit) (Signed)
08/06/2022  Name: Maria Fox MRN: [id number] DOB: May 25, 1945  Today's TOC FU Call Status: Today's TOC FU Call Status:: Successful TOC FU Call Competed TOC FU Call Complete Date: 08/06/22  Transition Care Management Follow-up Telephone Call Date of Discharge: 07/29/22 Discharge Facility: [facility]) Type of Discharge: Inpatient Admission Primary Inpatient Discharge Diagnosis:: UTI How have you been since you were released from the hospital?: Better Any questions or concerns?: No  Items Reviewed: Did you receive and understand the discharge instructions provided?: Yes Medications obtained,verified, and reconciled?: Yes (Medications Reviewed) Any new allergies since your discharge?: No Dietary orders reviewed?: No Do you have support at home?: Yes People in Home: alone Name of Support/Comfort Primary Source: [name]  Medications Reviewed Today: Medications Reviewed Today     Reviewed by Luella Cook, RN (Case Manager) on 08/06/22 at 1631  Med List Status: <None>   Medication Order Taking? Sig Documenting Provider Last Dose Status Informant  albuterol (PROAIR HFA) 108 (90 Base) MCG/ACT inhaler [id number] Yes Inhale 2 puffs into the lungs every 6 (six) hours as needed for wheezing or shortness of breath. Eustaquio Boyden, MD Taking Active Self, Pharmacy Records  apixaban (ELIQUIS) 5 MG TABS tablet [id number] Yes TAKE ONE TABLET BY MOUTH TWICE DAILY Turner, Cornelious Bryant, MD Taking Active Pharmacy Records, Self  atorvastatin (LIPITOR) 40 MG tablet [id number] Yes TAKE ONE TABLET BY MOUTH EVERY EVENING  Patient taking differently: Take 40 mg by mouth every evening. TAKE ONE TABLET BY MOUTH EVERY Onalee Hua, MD Taking Active Self, Pharmacy Records  clopidogrel (PLAVIX) 75 MG tablet [id number] Yes Take 1 tablet (75 mg total) by mouth daily. Quintella Reichert, MD Taking Active Pharmacy Records, Self  dorzolamide-timolol (COSOPT) 2-0.5 % ophthalmic solution [id number] Yes  Place 1 drop into both eyes 2 (two) times daily. [provider] Taking Active Self, Pharmacy Records  DULoxetine (CYMBALTA) 60 MG capsule [id number] Yes Take 1 capsule (60 mg total) by mouth daily. Eustaquio Boyden, MD Taking Active Pharmacy Records, Self  furosemide (LASIX) 20 MG tablet [id number] Yes Take 1 tablet (20 mg total) by mouth daily as needed for fluid or edema (leg swelling). Arrien, York Ram, MD Taking Active Self, Pharmacy Records  gabapentin (NEURONTIN) 300 MG capsule [id number] Yes TAKE ONE CAPSULE BY MOUTH TWICE DAILY  Patient taking differently: Take 300 mg by mouth 2 (two) times daily.   Eustaquio Boyden, MD Taking Active Self, Pharmacy Records  glipiZIDE (GLUCOTROL) 10 MG tablet [id number] Yes Take 1 tablet (10 mg total) by mouth 2 (two) times daily. Eustaquio Boyden, MD Taking Active Self, Pharmacy Records  insulin glargine-yfgn [facility]) 100 UNIT/ML injection [id number] Yes Inject 0.1 mLs (10 Units total) into the skin at bedtime. Regalado, Belkys A, MD Taking Active   isosorbide mononitrate (IMDUR) 30 MG 24 hr tablet [id number] Yes TAKE 1/2 TABLET BY MOUTH ONCE DAILY Turner, Cornelious Bryant, MD Taking Active Pharmacy Records, Self  latanoprost (XALATAN) 0.005 % ophthalmic solution [id number]  Place 1 drop into both eyes at bedtime.  [provider]  Active Self, Pharmacy Records           Med Note Ripley Fraise, Dereck Leep Jul 21, 2022  2:27 PM) Needs refill  metFORMIN (GLUCOPHAGE-XR) 500 MG 24 hr tablet [id number] Yes TAKE TWO TABLETS BY MOUTH EVERY MORNING  Patient taking differently: Take 1,000 mg by mouth every morning.   Eustaquio Boyden, MD Taking Active Self, Pharmacy Records  metoprolol succinate (TOPROL XL) 25 MG 24 hr  tablet [id number] Yes Take 1 tablet (25 mg total) by mouth daily. Quintella Reichert, MD Taking Active Pharmacy Records, Self  nitroGLYCERIN (NITROSTAT) 0.4 MG SL tablet [id number] Yes Dissolve 1 tab under tongue as needed for chest pain. May  repeat every 5 minutes x 2 doses. If no relief call 9-1-1. Quintella Reichert, MD Taking Active Self, Pharmacy Records  pantoprazole (PROTONIX) 40 MG tablet [id number] Yes TAKE ONE TABLET BY MOUTH TWICE DAILY  Patient taking differently: Take 40 mg by mouth 2 (two) times daily.   Eustaquio Boyden, MD Taking Active Pharmacy Records, Self  phenazopyridine (PYRIDIUM) 100 MG tablet [id number] Yes Take 1 tablet (100 mg total) by mouth 2 (two) times daily as needed (dysuria). Regalado, Belkys A, MD Taking Active   saccharomyces boulardii (FLORASTOR) 250 MG capsule [id number] Yes Take 1 capsule (250 mg total) by mouth 2 (two) times daily. Alba Cory, MD Taking Active             Home Care and Equipment/Supplies: Were Home Health Services Ordered?: NA Any new equipment or medical supplies ordered?: NA  Functional Questionnaire: Do you need assistance with bathing/showering or dressing?: No Do you need assistance with meal preparation?: No Do you need assistance with eating?: No Do you have difficulty maintaining continence: No Do you need assistance with getting out of bed/getting out of a chair/moving?: No Do you have difficulty managing or taking your medications?: No  Follow up appointments reviewed: PCP Follow-up appointment confirmed?: No MD Provider Line Number:[phone] Given: Yes (Patient wanted to schedule appt herself. Refused RN to make appt.) Specialist Hospital Follow-up appointment confirmed?: No Reason Specialist Follow-Up Not Confirmed: Patient has Specialist Provider Number and will Call for Appointment Do you need transportation to your follow-up appointment?: No Do you understand care options if your condition(s) worsen?: Yes-patient verbalized understanding  SDOH Interventions Today    Flowsheet Row Most Recent Value  SDOH Interventions   Food Insecurity Interventions Intervention Not Indicated  Housing Interventions Intervention Not Indicated  Transportation  Interventions Intervention Not Indicated      Interventions Today    Flowsheet Row Most Recent Value  General Interventions   General Interventions Discussed/Reviewed General Interventions Discussed, General Interventions Reviewed, Doctor Visits  Doctor Visits Discussed/Reviewed Doctor Visits Discussed, Doctor Visits Reviewed  [Patient refused RN to make appointments. She wanted to make them herself.]      TOC Interventions Today    Flowsheet Row Most Recent Value  TOC Interventions   TOC Interventions Discussed/Reviewed TOC Interventions Discussed, TOC Interventions Reviewed       Gean Maidens BSN RN [facility] [phone]

## 2022-08-08 NOTE — Progress Notes (Signed)
  Care Coordination   Note   08/08/2022 Name: Maria Fox MRN: [id number] DOB: 1945/08/19  Maria Fox is a 77 y.o. year old female who sees Eustaquio Boyden, MD for primary care. I reached out to [name] by phone today to offer care coordination services.  [name] was given information about Care Coordination services today including:   The Care Coordination services include support from the care team which includes your Nurse Coordinator, Clinical Social Worker, or Pharmacist.  The Care Coordination team is here to help remove barriers to the health concerns and goals most important to you. Care Coordination services are voluntary, and the patient may decline or stop services at any time by request to their care team member.   Care Coordination Consent Status: Patient agreed to services and verbal consent obtained.   Follow up plan:  Telephone appointment with care coordination team member scheduled for:  08/20/2022   Encounter Outcome:  Pt. Scheduled  Burman Nieves, CCMA Care Coordination Care Guide Direct Dial: [phone]

## 2022-08-12 ENCOUNTER — Other Ambulatory Visit: Payer: Self-pay | Admitting: Family Medicine

## 2022-08-12 NOTE — Telephone Encounter (Signed)
LAST APPOINTMENT DATE: 04/02/22 f/u DM No show appointment 07/02/22  NEXT APPOINTMENT DATE: none    LAST REFILL: 02/14/22  QTY: #180 1 rf

## 2022-08-15 ENCOUNTER — Telehealth: Payer: Self-pay

## 2022-08-15 NOTE — Progress Notes (Signed)
Care Management & Coordination Services Pharmacy Team  Reason for Encounter: Medication coordination and delivery  Contacted patient to discuss medications and coordinate delivery from [facility]. Spoke with patient on 08/16/2022  Cycle dispensing form sent to [facility]  for review.   Last adherence delivery date:07/25/22    Patient is due for next adherence delivery on: 08/26/22  This delivery to include: Adherence Packaging  30 Days  Pantoprazole 40 mg - 1 tablet twice daily (1 breakfast, 1 evening meal) Gabapentin 300 mg- 1 tablet twice daily (1 breakfast, 1 evening meal) Duloxetine 60 mg - 1 capsule daily (1 breakfast) Atorvastatin 40 mg - 1 tablet daily (1 evening meal) Isosorbide Mononitrate 30 mg - 1/2 tablet daily (1/2 breakfast) Glipizide 10 mg - 1 tablet twice daily (1 breakfast, 1 evening meal) Metformin 500 mg ER - 2 tablets ( breakfast) Metoprolol succinate 25 mg ER - 1 tablet daily  (breakfast) Clopidogrel 75mg  - Take 1 tablet daily ( breakfast) Eliquis 5mg - take 1 breakfast- take 1 evening meal   Vials: Tramadol 50mg - take 1-2 tablets as needed for pain  Latanoprost eye drops-  place 1 drop in both eyes qhs  Patient declined the following medications this month: Farxiga- d'cd hospital stay    Refills requested from providers include: tramadol  Confirmed delivery date of 08/26/22, advised patient that pharmacy will contact them the morning of delivery.   Any concerns about your medications? Patient states she has not started [facility] and not interested in starting the insulin .  How often do you forget or accidentally miss a dose? Never  Do you use a pillbox? No  Is patient in packaging Yes  Any concerns or issues with your packaging? satisfied  Recent blood pressure readings are as follows:none available  Recent blood glucose readings are as follows:none available   Chart review: Recent office visits:  None since last contact  Recent  consult visits:  None since last contact  Hospital visits:  07/21/22 - 07/29/22-  [facility] - UTI- discharge home with antibiotics, hold  farxiga 5mg   due to UTI Start : semglee,pyridium,florastor,keflex  Medications: Outpatient Encounter Medications as of 08/15/2022  Medication Sig Note   albuterol (PROAIR HFA) 108 (90 Base) MCG/ACT inhaler Inhale 2 puffs into the lungs every 6 (six) hours as needed for wheezing or shortness of breath.    apixaban (ELIQUIS) 5 MG TABS tablet TAKE ONE TABLET BY MOUTH TWICE DAILY    atorvastatin (LIPITOR) 40 MG tablet TAKE ONE TABLET BY MOUTH EVERY EVENING (Patient taking differently: Take 40 mg by mouth every evening. TAKE ONE TABLET BY MOUTH EVERY EVENING)    clopidogrel (PLAVIX) 75 MG tablet Take 1 tablet (75 mg total) by mouth daily.    dorzolamide-timolol (COSOPT) 2-0.5 % ophthalmic solution Place 1 drop into both eyes 2 (two) times daily.    DULoxetine (CYMBALTA) 60 MG capsule Take 1 capsule (60 mg total) by mouth daily.    furosemide (LASIX) 20 MG tablet Take 1 tablet (20 mg total) by mouth daily as needed for fluid or edema (leg swelling).    gabapentin (NEURONTIN) 300 MG capsule TAKE ONE CAPSULE BY MOUTH TWICE DAILY    glipiZIDE (GLUCOTROL) 10 MG tablet Take 1 tablet (10 mg total) by mouth 2 (two) times daily.    insulin glargine-yfgn (SEMGLEE) 100 UNIT/ML injection Inject 0.1 mLs (10 Units total) into the skin at bedtime.    isosorbide mononitrate (IMDUR) 30 MG 24 hr tablet TAKE 1/2 TABLET BY MOUTH  ONCE DAILY    latanoprost (XALATAN) 0.005 % ophthalmic solution Place 1 drop into both eyes at bedtime.  07/21/2022: Needs refill   metFORMIN (GLUCOPHAGE-XR) 500 MG 24 hr tablet TAKE TWO TABLETS BY MOUTH EVERY MORNING (Patient taking differently: Take 1,000 mg by mouth every morning.)    metoprolol succinate (TOPROL XL) 25 MG 24 hr tablet Take 1 tablet (25 mg total) by mouth daily.    nitroGLYCERIN (NITROSTAT) 0.4 MG SL tablet Dissolve 1 tab under  tongue as needed for chest pain. May repeat every 5 minutes x 2 doses. If no relief call 9-1-1.    pantoprazole (PROTONIX) 40 MG tablet TAKE ONE TABLET BY MOUTH TWICE DAILY (Patient taking differently: Take 40 mg by mouth 2 (two) times daily.)    phenazopyridine (PYRIDIUM) 100 MG tablet Take 1 tablet (100 mg total) by mouth 2 (two) times daily as needed (dysuria).    saccharomyces boulardii (FLORASTOR) 250 MG capsule Take 1 capsule (250 mg total) by mouth 2 (two) times daily.    No facility-administered encounter medications on file as of 08/15/2022.   BP Readings from Last 3 Encounters:  07/29/22 124/64  05/22/22 135/77  04/05/22 (!) 104/56    Pulse Readings from Last 3 Encounters:  07/29/22 74  05/22/22 71  04/05/22 82    Lab Results  Component Value Date/Time   HGBA1C 9.2 (H) 02/25/2022 03:55 AM   HGBA1C 9.5 (H) 10/30/2021 02:52 PM   Lab Results  Component Value Date   CREATININE 1.16 (H) 07/29/2022   BUN 23 07/29/2022   GFR 37.17 (L) 04/02/2022   GFRNONAA 48 (L) 07/29/2022   GFRAA 41 (L) 01/28/2020   NA 135 07/29/2022   K 4.1 07/29/2022   CALCIUM 8.5 (L) 07/29/2022   CO2 19 (L) 07/29/2022     Al Corpus, PharmD notified  Burt Knack, [facility] Clinical Pharmacy Assistant [phone]
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
## 2022-08-16 NOTE — Telephone Encounter (Signed)
Patient reported to pharmacy assistant that she is "about to have a breakdown" and requests assistance with "mental state".  Unable to reach patient by phone myself.  Routing to triage and PCP pool for assistance.

## 2022-08-20 ENCOUNTER — Ambulatory Visit (INDEPENDENT_AMBULATORY_CARE_PROVIDER_SITE_OTHER): Payer: 59 | Admitting: Family Medicine

## 2022-08-20 ENCOUNTER — Encounter: Payer: Self-pay | Admitting: Family Medicine

## 2022-08-20 ENCOUNTER — Telehealth: Payer: Self-pay

## 2022-08-20 ENCOUNTER — Encounter (HOSPITAL_COMMUNITY): Payer: Self-pay | Admitting: Behavioral Health

## 2022-08-20 ENCOUNTER — Ambulatory Visit (HOSPITAL_COMMUNITY)
Admission: EM | Admit: 2022-08-20 | Discharge: 2022-08-20 | Disposition: A | Discharge status: Home / Self Care | Payer: 59 | Attending: Behavioral Health | Admitting: Behavioral Health

## 2022-08-20 VITALS — BP 110/80 | HR 81 | Temp 97.5°F | Ht 63.0 in | Wt 130.0 lb

## 2022-08-20 DIAGNOSIS — F332 Major depressive disorder, recurrent severe without psychotic features: Secondary | ICD-10-CM

## 2022-08-20 DIAGNOSIS — Z01818 Encounter for other preprocedural examination: Secondary | ICD-10-CM

## 2022-08-20 DIAGNOSIS — E113293 Type 2 diabetes mellitus with mild nonproliferative diabetic retinopathy without macular edema, bilateral: Secondary | ICD-10-CM

## 2022-08-20 DIAGNOSIS — R45851 Suicidal ideations: Secondary | ICD-10-CM

## 2022-08-20 DIAGNOSIS — Z7984 Long term (current) use of oral hypoglycemic drugs: Secondary | ICD-10-CM | POA: Diagnosis not present

## 2022-08-20 DIAGNOSIS — E1149 Type 2 diabetes mellitus with other diabetic neurological complication: Secondary | ICD-10-CM | POA: Diagnosis not present

## 2022-08-20 DIAGNOSIS — R45850 Homicidal ideations: Secondary | ICD-10-CM | POA: Diagnosis not present

## 2022-08-20 DIAGNOSIS — F331 Major depressive disorder, recurrent, moderate: Secondary | ICD-10-CM | POA: Diagnosis present

## 2022-08-20 LAB — POCT GLYCOSYLATED HEMOGLOBIN (HGB A1C): Hemoglobin A1C: 8 % — AB (ref 4.0–5.6)

## 2022-08-20 NOTE — Assessment & Plan Note (Signed)
Worsened depression despite compliance with duloxetine 60mg  daily.  Endorsing SI/HI with plan, but no attempts.  No noted psychosis.  Given immediate concerns, recommend urgent evaluation at local behavioral health urgent care center.  Agrees to go seek care at behavioral health urgent care now - contact information provided. She states Maria Fox will take her there today.  Would benefit from couples counseling once acute concerns taken care of.

## 2022-08-20 NOTE — Progress Notes (Signed)
   08/20/22 1556  [facility] Triage Screening (Walk-ins at [facility] only)  How Did You Hear About [place]? Primary Care  What Is the Reason for Your Visit/Call Today? Pt presents to [facility] voluntarily due to depression symptoms. Pt states her husband of 40 years has been chatting with a woman on facebook and it has been bothering her. Pt also reports she has been sick alot lately with UTI's. Pt reports she saw her PCP at [facility] in [place] today and he recommended an evaluation and therapy services for couples counseling. Pt denies SI/HI and AVH.  How Long Has This Been Causing You Problems? <Week  Have You Recently Had Any Thoughts About Hurting Yourself? No  Are You Planning to Commit Suicide/Harm Yourself At This time? No  Have you Recently Had Thoughts About Hurting Someone Karolee Ohs? No  Are You Planning To Harm Someone At This Time? No  Are you currently experiencing any auditory, visual or other hallucinations? No  Have You Used Any Alcohol or Drugs in the Past 24 Hours? No  Do you have any current medical co-morbidities that require immediate attention? No  Clinician description of patient physical appearance/behavior: calm, cooperative  What Do You Feel Would Help You the Most Today? Treatment for Depression or other mood problem  If access to [facility] was not available, would you have sought care in the Emergency Department? No  Determination of Need Routine (7 days)  Options For Referral Outpatient Therapy

## 2022-08-20 NOTE — Patient Outreach (Signed)
  Care Coordination   08/20/2022 Name: Maria Fox MRN: [id number] DOB: Feb 03, 1946   Care Coordination Outreach Attempts:  An unsuccessful telephone outreach was attempted for a scheduled appointment today. HIPAA compliant message left with call back phone number.   Follow Up Plan:  Additional outreach attempts will be made to offer the patient care coordination information and services.   Encounter Outcome:  No Answer   Care Coordination Interventions:  No, not indicated    [name] Care Coordination [phone] direct line

## 2022-08-20 NOTE — Patient Instructions (Addendum)
Return Friday 4pm for dental clearance.   I do recommend couples counseling - let me know if you'd like referral.   For immediate concerns, I recommend you go to Behavioral Health Urgent care today for evaluation - address below:  [facility]  [address], [place], [place] [address]  [phone]

## 2022-08-20 NOTE — Discharge Instructions (Addendum)
Base on the information you have provided and the presenting issue, outpatient services with therapy and psychiatry have been recommended.  It is imperative that you follow through with treatment recommendations within 5-7 days from the of discharge to mitigate further risk to your safety and mental well-being. A list of referrals has been provided below to get you started.  You are not limited to the list provided.  In case of an urgent crisis, you may contact the Mobile Crisis Unit with [facility] at [phone].  Outpatient Therapy and Psychiatry for Medicare Recipients  [facility] [address] [place], [place], [address] [phone] phone  [facility] [address] [place], [place], [address] [phone] phone (Aetna, [employer] [place], BCBS, Cigna, [employer], [employer], [facility], BCBS Healthy Blue, Humana, [employer], Medcost, Medicare, Medicaid, Optum, Tricare, UHC, UHC Community Plan, Wellcare)  [facility] [address] [place], [place], [address] [phone] phone  [facility] [address] [place], [place], [address] [phone] phone  [facility] [address] [place], [place], [address] [phone] phone [phone] fax  [facility] [address] [place], [place], [address] [phone] phone  [facility] [address] [place], [place], [address] [phone] phone [phone] fax  [facility] [address] [place], [place], [address] [phone] phone  [facility] [address] [place], [place], [address] [phone] phone  [facility] [address] [place], [place], [address] [phone] phone [phone] fax  

## 2022-08-20 NOTE — Progress Notes (Unsigned)
Ph: [phone] Fax: [phone]   Patient ID: Maria Fox, female    DOB: 1945/11/01, 77 y.o.   MRN: [id number]  This visit was conducted in person.  BP 110/80   Pulse 81   Temp (!) 97.5 F (36.4 C) (Temporal)   Ht 5\' 3"  (1.6 m)   Wt 130 lb (59 kg)   SpO2 98%   BMI 23.03 kg/m    CC: depression follow up  Subjective:   HPI: Maria Fox is a 77 y.o. female presenting on 08/20/2022 for Medical Management of Chronic Issues (Here for 3 mo f/u including mood. Also, provided form for dental work. )   Last seen 03/2022 for DM f/u.   See recent phone note for details. Notes deteriorated mood with intermittent SI/HI with plan without attempt.   Brings pill pack - verified she continues on cymbalta 60mg  daily. She is not taking Semglee insulin.   Relationship stress - she is upset that [name] partner of 40 yrs has been going on Facebook talking to other girls long distance, he continues doing this despite her asking him to stop. He says there is nothing going on. She has gotten upset and threatened to shoot him and other girls with her pistol however he keeps the pistol away from her. She has also thought about hurting herself by overdosing on medicines.  She feels she would call 911 or go to hospital if worsening thoughts but feels she's having a "mental breakdown" due to current stressors.  She does feel safe at home. She states she's thinking about moving into assisted living facility.   Brings dental clearance forms - she will return Friday for dental clearance evaluation.      Relevant past medical, surgical, family and social history reviewed and updated as indicated. Interim medical history since our last visit reviewed. Allergies and medications reviewed and updated. Outpatient Medications Prior to Visit  Medication Sig Dispense Refill   albuterol (PROAIR HFA) 108 (90 Base) MCG/ACT inhaler Inhale 2 puffs into the lungs every 6 (six) hours as needed for wheezing or shortness  of breath. 18 g 0   apixaban (ELIQUIS) 5 MG TABS tablet TAKE ONE TABLET BY MOUTH TWICE DAILY 60 tablet 5   atorvastatin (LIPITOR) 40 MG tablet TAKE ONE TABLET BY MOUTH EVERY EVENING (Patient taking differently: Take 40 mg by mouth every evening. TAKE ONE TABLET BY MOUTH EVERY EVENING) 90 tablet 3   clopidogrel (PLAVIX) 75 MG tablet Take 1 tablet (75 mg total) by mouth daily. 90 tablet 3   dapagliflozin propanediol (FARXIGA) 5 MG TABS tablet Take 5 mg by mouth daily.     dorzolamide-timolol (COSOPT) 2-0.5 % ophthalmic solution Place 1 drop into both eyes 2 (two) times daily.     DULoxetine (CYMBALTA) 60 MG capsule Take 1 capsule (60 mg total) by mouth daily. 30 capsule 11   furosemide (LASIX) 20 MG tablet Take 1 tablet (20 mg total) by mouth daily as needed for fluid or edema (leg swelling). 30 tablet    gabapentin (NEURONTIN) 300 MG capsule TAKE ONE CAPSULE BY MOUTH TWICE DAILY 180 capsule 1   glipiZIDE (GLUCOTROL) 10 MG tablet Take 1 tablet (10 mg total) by mouth 2 (two) times daily. 180 tablet 3   insulin glargine-yfgn (SEMGLEE) 100 UNIT/ML injection Inject 0.1 mLs (10 Units total) into the skin at bedtime. 10 mL 11   isosorbide mononitrate (IMDUR) 30 MG 24 hr tablet TAKE 1/2 TABLET BY MOUTH ONCE DAILY 45 tablet  3   latanoprost (XALATAN) 0.005 % ophthalmic solution Place 1 drop into both eyes at bedtime.      metFORMIN (GLUCOPHAGE-XR) 500 MG 24 hr tablet TAKE TWO TABLETS BY MOUTH EVERY MORNING (Patient taking differently: Take 1,000 mg by mouth every morning.) 180 tablet 3   metoprolol succinate (TOPROL XL) 25 MG 24 hr tablet Take 1 tablet (25 mg total) by mouth daily. 90 tablet 3   nitroGLYCERIN (NITROSTAT) 0.4 MG SL tablet Dissolve 1 tab under tongue as needed for chest pain. May repeat every 5 minutes x 2 doses. If no relief call 9-1-1. 25 tablet 2   pantoprazole (PROTONIX) 40 MG tablet TAKE ONE TABLET BY MOUTH TWICE DAILY (Patient taking differently: Take 40 mg by mouth 2 (two) times daily.)  180 tablet 1   phenazopyridine (PYRIDIUM) 100 MG tablet Take 1 tablet (100 mg total) by mouth 2 (two) times daily as needed (dysuria). 10 tablet 0   saccharomyces boulardii (FLORASTOR) 250 MG capsule Take 1 capsule (250 mg total) by mouth 2 (two) times daily. 30 capsule 0   No facility-administered medications prior to visit.     Per HPI unless specifically indicated in ROS section below Review of Systems  Objective:  BP 110/80   Pulse 81   Temp (!) 97.5 F (36.4 C) (Temporal)   Ht 5\' 3"  (1.6 m)   Wt 130 lb (59 kg)   SpO2 98%   BMI 23.03 kg/m   Wt Readings from Last 3 Encounters:  08/20/22 130 lb (59 kg)  07/21/22 133 lb (60.3 kg)  05/22/22 123 lb (55.8 kg)      Physical Exam Vitals and nursing note reviewed.  Constitutional:      Appearance: Normal appearance. She is not ill-appearing.  HENT:     Mouth/Throat:     Mouth: Mucous membranes are moist.     Pharynx: Oropharynx is clear. No posterior oropharyngeal erythema.  Neurological:     Mental Status: She is alert.  Psychiatric:        Attention and Perception: Attention normal.        Mood and Affect: Mood is anxious and depressed.        Speech: Speech normal.        Behavior: Behavior normal.        Thought Content: Thought content normal.        Cognition and Memory: Cognition normal.       Lab Results  Component Value Date   HGBA1C 8.0 (A) 08/20/2022      Component Value Date/Time   NA 135 07/29/2022 1005   NA 137 01/28/2020 1532   K 4.1 07/29/2022 1005   CL 105 07/29/2022 1005   CO2 19 (L) 07/29/2022 1005   GLUCOSE 380 (H) 07/29/2022 1005   BUN 23 07/29/2022 1005   BUN 13 01/28/2020 1532   CREATININE 1.16 (H) 07/29/2022 1005   CREATININE 1.45 (H) 01/26/2021 1625   CALCIUM 8.5 (L) 07/29/2022 1005   PROT 5.9 (L) 07/24/2022 0511   PROT 6.1 10/17/2017 0927   ALBUMIN 2.8 (L) 07/24/2022 0511   ALBUMIN 4.0 10/17/2017 0927   AST 12 (L) 07/24/2022 0511   ALT 10 07/24/2022 0511   ALKPHOS 56 07/24/2022  0511   BILITOT 0.5 07/24/2022 0511   BILITOT 0.3 10/17/2017 0927   GFR 37.17 (L) 04/02/2022 1538   GFRNONAA 48 (L) 07/29/2022 1005   Lab Results  Component Value Date   WBC 5.6 07/29/2022   HGB 11.3 (L)  07/29/2022   HCT 36.1 07/29/2022   MCV 93.0 07/29/2022   PLT 190 07/29/2022       08/20/2022    2:20 PM 04/02/2022    3:02 PM 10/25/2021   11:16 AM 06/06/2020    3:38 PM 05/02/2020    4:55 PM  Depression screen PHQ 2/9  Decreased Interest 2 0 3 2 2   Down, Depressed, Hopeless 2 0 2 2 2   PHQ - 2 Score 4 0 5 4 4   Altered sleeping 1  0 0 0  Tired, decreased energy 1  0 0 2  Change in appetite 0  0 0 2  Feeling bad or failure about yourself  2  0 0 2  Trouble concentrating 1  0 0 1  Moving slowly or fidgety/restless 1  0 0 1  Suicidal thoughts 0  0 0 0  PHQ-9 Score 10  5 4 12   Difficult doing work/chores Somewhat difficult  Somewhat difficult Not difficult at all Very difficult       08/20/2022    2:21 PM  GAD 7 : Generalized Anxiety Score  Nervous, Anxious, on Edge 1  Control/stop worrying 1  Worry too much - different things 1  Trouble relaxing 1  Restless 1  Easily annoyed or irritable 1  Afraid - awful might happen 1  Total GAD 7 Score 7  Anxiety Difficulty Somewhat difficult   Assessment & Plan:   Problem List Items Addressed This Visit     MDD (major depressive disorder), recurrent severe, without psychosis ([name])    Worsened depression despite compliance with duloxetine 60mg  daily.  Endorsing SI/HI with plan, but no attempts.  No noted psychosis.  Given immediate concerns, recommend urgent evaluation at local behavioral health urgent care center.  Agrees to go seek care at behavioral health urgent care now - contact information provided. She states [name] will take her there today.  Would benefit from couples counseling once acute concerns taken care of.       Type 2 diabetes mellitus with neurological complications ([name])    Chronic, improvement based on  today's A1c.  Verified she continues farxiga 5mg  daily, glipizide 10mg  BID with meals, metformin XR 1000mg  daily.  She is not currently taking Semglee insulin - has trouble giving herself shots.       Relevant Medications   dapagliflozin propanediol (FARXIGA) 5 MG TABS tablet   Other Relevant Orders   POCT glycosylated hemoglobin (Hb A1C) (Completed)   Pre-op evaluation    She will return Friday for preop prior to planned dental work.  RCRI = 3 (CAD, CHF, stroke history)      Other Visit Diagnoses     Homicidal ideation    -  Primary   Suicidal ideation            No orders of the defined types were placed in this encounter.   Orders Placed This Encounter  Procedures   POCT glycosylated hemoglobin (Hb A1C)    Patient Instructions  Return Friday 4pm for dental clearance.   I do recommend couples counseling - let me know if you'd like referral.   For immediate concerns, I recommend you go to Behavioral Health Urgent care today for evaluation - address below:  [facility]  [address] [place], [place] [address]  [phone]   Follow up plan: No follow-ups on file.  Eustaquio Boyden, MD

## 2022-08-20 NOTE — ED Provider Notes (Cosign Needed Addendum)
[facility]al [facility]gent Care Medical Screening Exam  Patient Name: Maria Fox MRN: [id number] Date of Evaluation: 08/20/22 Chief Complaint:  "I've got a lot of things going on" Diagnosis:  Final diagnoses:  MDD (major depressive disorder), recurrent episode, moderate ([name])   History of Present Illness: Maria Fox is a 77 y.o. female patient with a past psychiatric history of MDD who presented to [facility] voluntarily and accompanied by her husband Maria Fox) for a walk-in assessment with complaints of depressive symptoms. Patient's husband waited in the car outside during assessment.   Patient assessed face-to-face by this provider and chart reviewed on 08/20/22. On evaluation, Maria Fox is seated in assessment area in no acute distress. Patient is alert and oriented x4, calm, cooperative, and pleasant. Speech is clear and coherent, normal rate and volume. Eye contact is good. Mood is depressed with congruent affect. Thought process is coherent with logical thought content. Patient denies suicidal and homicidal ideations and easily contracts verbally for safety with this Clinical research associate. Patient reports this past weekend, she did have thoughts about hurting "the girl he (her husband) was chatting with. I said I wished I could get a hold of her." Patient denies that she had any plans or intentions of actually harming anyone. Patient denies a history of suicide attempts or self-harm. Patient reports one past psychiatric hospitalization in [place] "several years ago for depression." Patient denies auditory and visual hallucinations. Patient denies symptoms of paranoia. Patient is able to converse coherently with goal-directed thoughts and no distractibility or preoccupation. Objectively, there is no evidence of psychosis/mania, delusional thinking, or indication that patient is responding to internal or external stimuli.  Patient reports fair sleep (6 hours/night) and good appetite. Patient lives in  [place] with her husband and denies that she has access to weapons/firearms. Patient is retired. Patient denies use of alcohol or illicit substances. Patient states her PCP. "Dr. Reece Agar" at [facility] has been prescribing her Cymbalta "for years." Patient states she saw Dr. Reece Agar today "and he asked me to come here and recommended couples counseling." Patient reports depressive symptoms stating "I can't get my energy back, I can't do what I want in the house and yard." Patient states "I've got a lot of things going on" and "I've been in the hospital off and on a lot for urinary tract infections and my bowels messed up." Patient states her husband of 40 years has been "chatting with a girl on FaceBook, that's kind of got to me. He reads to me what he chats but it bothers me." Patient denies that she currently has outpatient psychiatric services in place for therapy or medication management but states she is interested in these services.  Patient offered support and encouragement. Discussed patient following up with outpatient psychiatric resources provided in AVS for therapy and medication management. Patient is in agreement with plan of care.  At this time, Maria Fox is educated and verbalizes understanding of mental health resources and other crisis services in the community. She is instructed to call 911 and present to the nearest emergency room should she experience any suicidal/homicidal ideation, auditory/visual/hallucinations, or detrimental worsening of her mental health condition.    Flowsheet Row ED from 08/20/2022 in [facility] ED to Hosp-Admission (Discharged) from 07/21/2022 in [facility] ED from 05/22/2022 in [facility] Emergency Department at [facility]  C-SSRS RISK CATEGORY No Risk No Risk No Risk  Psychiatric Specialty Exam  Presentation  General Appearance:Appropriate for Environment;  Casual  Eye Contact:Good  Speech:Clear and Coherent; Normal Rate  Speech Volume:Normal  Handedness:Right   Mood and Affect  Mood: Depressed  Affect: Congruent   Thought Process  Thought Processes: Coherent; Goal Directed  Descriptions of Associations:Intact  Orientation:Full (Time, Place and Person)  Thought Content:Logical    Hallucinations:None  Ideas of Reference:None  Suicidal Thoughts:No  Homicidal Thoughts:No   Sensorium  Memory: Immediate Good; Recent Good; Remote Good  Judgment: Fair  Insight: Fair   Art therapist  Concentration: Good  Attention Span: Good  Recall: Good  Fund of Knowledge: Good  Language: Good   Psychomotor Activity  Psychomotor Activity: Normal   Assets  Assets: Communication Skills; Desire for Improvement; Financial Resources/Insurance; Housing; Leisure Time; Physical Health; Resilience; Social Support; Transportation   Sleep  Sleep: Fair  Number of hours:  6   Physical Exam: Physical Exam Vitals and nursing note reviewed.  Constitutional:      General: She is not in acute distress.    Appearance: Normal appearance. She is not ill-appearing.  HENT:     Head: Normocephalic and atraumatic.     Nose: Nose normal.  Eyes:     General:        Right eye: No discharge.        Left eye: No discharge.     Conjunctiva/sclera: Conjunctivae normal.  Cardiovascular:     Rate and Rhythm: Normal rate.  Pulmonary:     Effort: Pulmonary effort is normal. No respiratory distress.  Musculoskeletal:        General: Normal range of motion.     Cervical back: Normal range of motion.  Skin:    General: Skin is warm and dry.  Neurological:     General: No focal deficit present.     Mental Status: She is alert and oriented to person, place, and time. Mental status is at baseline.  Psychiatric:        Attention and Perception: Attention and perception normal.        Mood and Affect: Mood is depressed.         Speech: Speech normal.        Behavior: Behavior normal. Behavior is cooperative.        Thought Content: Thought content normal. Thought content is not paranoid or delusional. Thought content does not include homicidal or suicidal ideation. Thought content does not include homicidal or suicidal plan.        Cognition and Memory: Cognition and memory normal.        Judgment: Judgment normal.     Comments: Affect: Congruent    Review of Systems  Constitutional: Negative.   HENT: Negative.    Eyes: Negative.   Respiratory: Negative.    Cardiovascular: Negative.   Gastrointestinal: Negative.   Genitourinary: Negative.   Musculoskeletal: Negative.   Skin: Negative.   Neurological: Negative.   Endo/Heme/Allergies: Negative.   Psychiatric/Behavioral:  Positive for depression. Negative for hallucinations, memory loss, substance abuse and suicidal ideas. The patient is not nervous/anxious and does not have insomnia.    Blood pressure 124/79, pulse 80, temperature 97.6 F (36.4 C), temperature source Oral, resp. rate 16, SpO2 100 %. There is no height or weight on file to calculate BMI.  Musculoskeletal: Strength & Muscle Tone: within normal limits Gait & Station: normal Patient leans: N/A   [facility] MSE Discharge Disposition for Follow up and Recommendations: Based on my evaluation the patient does  not appear to have an emergency medical condition and can be discharged with resources and follow up care in outpatient services for Medication Management and Individual Therapy   Sunday Corn, NP 08/20/2022, 6:00 PM

## 2022-08-21 DIAGNOSIS — Z01818 Encounter for other preprocedural examination: Secondary | ICD-10-CM | POA: Insufficient documentation

## 2022-08-21 NOTE — Assessment & Plan Note (Addendum)
She will return Friday for preop prior to planned dental work.  RCRI = 3 (CAD, CHF, stroke history)

## 2022-08-21 NOTE — Assessment & Plan Note (Addendum)
Chronic, improvement based on today's A1c.  Verified she continues farxiga 5mg  daily, glipizide 10mg  BID with meals, metformin XR 1000mg  daily.  She is not currently taking Semglee insulin - has trouble giving herself shots.

## 2022-08-22 ENCOUNTER — Telehealth: Payer: Self-pay | Admitting: Pharmacist

## 2022-08-22 NOTE — Progress Notes (Signed)
Attempted to contact patient to notify of cancellation of upcoming appointment with [facility] Pharmacist. Unable to leave voicemail. Patient has an appointment with PCP tomorrow.   Please feel free to reach out with future medication questions or concerns.   Catie Eppie Gibson, PharmD, BCACP, CPP [facility] [phone]

## 2022-08-23 ENCOUNTER — Encounter: Payer: Self-pay | Admitting: Family Medicine

## 2022-08-23 ENCOUNTER — Ambulatory Visit (INDEPENDENT_AMBULATORY_CARE_PROVIDER_SITE_OTHER): Payer: 59 | Admitting: Family Medicine

## 2022-08-23 VITALS — BP 116/76 | HR 95 | Temp 97.8°F | Ht 63.0 in | Wt 130.0 lb

## 2022-08-23 DIAGNOSIS — I25118 Atherosclerotic heart disease of native coronary artery with other forms of angina pectoris: Secondary | ICD-10-CM | POA: Diagnosis not present

## 2022-08-23 DIAGNOSIS — E538 Deficiency of other specified B group vitamins: Secondary | ICD-10-CM | POA: Diagnosis not present

## 2022-08-23 DIAGNOSIS — N3001 Acute cystitis with hematuria: Secondary | ICD-10-CM

## 2022-08-23 DIAGNOSIS — Z7901 Long term (current) use of anticoagulants: Secondary | ICD-10-CM

## 2022-08-23 DIAGNOSIS — N390 Urinary tract infection, site not specified: Secondary | ICD-10-CM

## 2022-08-23 DIAGNOSIS — Z01818 Encounter for other preprocedural examination: Secondary | ICD-10-CM

## 2022-08-23 DIAGNOSIS — I4892 Unspecified atrial flutter: Secondary | ICD-10-CM | POA: Diagnosis not present

## 2022-08-23 DIAGNOSIS — I5032 Chronic diastolic (congestive) heart failure: Secondary | ICD-10-CM | POA: Diagnosis not present

## 2022-08-23 DIAGNOSIS — E1149 Type 2 diabetes mellitus with other diabetic neurological complication: Secondary | ICD-10-CM | POA: Diagnosis not present

## 2022-08-23 DIAGNOSIS — E1122 Type 2 diabetes mellitus with diabetic chronic kidney disease: Secondary | ICD-10-CM

## 2022-08-23 DIAGNOSIS — R300 Dysuria: Secondary | ICD-10-CM | POA: Diagnosis not present

## 2022-08-23 DIAGNOSIS — I951 Orthostatic hypotension: Secondary | ICD-10-CM

## 2022-08-23 DIAGNOSIS — N183 Chronic kidney disease, stage 3 unspecified: Secondary | ICD-10-CM

## 2022-08-23 DIAGNOSIS — F331 Major depressive disorder, recurrent, moderate: Secondary | ICD-10-CM

## 2022-08-23 DIAGNOSIS — Z8673 Personal history of transient ischemic attack (TIA), and cerebral infarction without residual deficits: Secondary | ICD-10-CM

## 2022-08-23 DIAGNOSIS — Z951 Presence of aortocoronary bypass graft: Secondary | ICD-10-CM

## 2022-08-23 LAB — POC URINALSYSI DIPSTICK (AUTOMATED)
Bilirubin, UA: NEGATIVE
Glucose, UA: POSITIVE — AB
Ketones, UA: NEGATIVE
Nitrite, UA: POSITIVE
Protein, UA: POSITIVE — AB
Spec Grav, UA: 1.02 (ref 1.010–1.025)
Urobilinogen, UA: 0.2 E.U./dL
pH, UA: 5.5 (ref 5.0–8.0)

## 2022-08-23 LAB — CBC WITH DIFFERENTIAL/PLATELET
Absolute Monocytes: 637 cells/uL (ref 200–950)
Basophils Absolute: 109 cells/uL (ref 0–200)
Basophils Relative: 1.2 %
HCT: 39.2 % (ref 35.0–45.0)
Lymphs Abs: 2712 cells/uL (ref 850–3900)
MCHC: 32.7 g/dL (ref 32.0–36.0)
MPV: 11.4 fL (ref 7.5–12.5)
Monocytes Relative: 7 %
Neutro Abs: 5151 cells/uL (ref 1500–7800)
Neutrophils Relative %: 56.6 %

## 2022-08-23 MED ORDER — NITROFURANTOIN MONOHYD MACRO 100 MG PO CAPS: 100.0000 mg | ORAL_CAPSULE | Freq: Two times a day (BID) | ORAL | 0 refills | Status: AC

## 2022-08-23 MED ORDER — ONDANSETRON HCL 4 MG PO TABS: 4.0000 mg | ORAL_TABLET | Freq: Three times a day (TID) | ORAL | 0 refills | Status: DC | PRN

## 2022-08-23 NOTE — Patient Instructions (Addendum)
Urinalysis and labwork today  EKG today  I will refer you to psychiatrist and psychologist.  I will refer you to urologist for ongoing urine infections. Stop farxiga at this time. Hold lasix at this time.  Urine suspicious for infection - take macrobid antibiotic for 5 days. We will be in touch with urine culture results. Hold isosorbide (imdur) 1/2 tablet at this time. If any chest pain, may restart this medicine.  Continue drinking plenty of water.  Return in 2 weeks for labs to repeat urine test ensure infection is resolved.  Return in 1 month for follow up visit.

## 2022-08-23 NOTE — Progress Notes (Signed)
Ph: [phone] Fax: [phone]   Patient ID: [name], female    DOB: [birth year], 77 y.o.   MRN: [id number]  This visit was conducted in person.  BP 116/76   Pulse 95   Temp 97.8 F (36.6 C) (Temporal)   Ht 5\' 3"  (1.6 m)   Wt 130 lb (59 kg)   SpO2 98%   BMI 23.03 kg/m   No data found. Supine 136/82 Standing 108/64  CC: preop eval Subjective:   HPI: TIGER PRIMAS is a 77 y.o. female presenting on 08/23/2022 for Pre-op Exam (Here for clearance for dental procedure. Wants to discuss Plavix. Also, pt requests refill of Zofran. )   Recent behavioral health [facility] evaluation discussed. Referral placed to psychiatry and psychology.   SHASTA BARBAREE  has a past medical history of Acute cholecystitis (07/07/2019), Acute deep vein thrombosis (DVT) of left tibial vein ([name]) (07/11/2019), Acute left PCA stroke ([name]) (07/13/2015), Angioedema, Atrial flutter ([name]), Autoimmune deficiency syndrome ([name]), CAD (coronary artery disease), native coronary artery (06/2014), Chronic diastolic CHF (congestive heart failure) ([name]) (08/27/2014), Chronic kidney disease, stage 3b ([name]), Closed fracture of maxilla ([name]), COVID-19 virus infection (03/2020), CVA (cerebral infarction) (07/2014), Dermatitis, DM (diabetes mellitus), type 2, uncontrolled w/neurologic complication (06/02/2012), Epidermal cyst of neck (03/25/2017), HCAP (healthcare-associated pneumonia) (06/2014), History of chicken pox, HLD (hyperlipidemia), migraines, Hypertension, Lobar pneumonia ([name]) (03/02/2022), Maxillary fracture ([name]), Mitral regurgitation, Multiple allergies, NSVT (nonsustained ventricular tachycardia) ([name]), Orthostatic hypotension (07/2015), Pleural effusion, left, RBBB, S/P lens implant, Tricuspid regurgitation, UTI (urinary tract infection) (06/2014), and Vitiligo.  Planned upcoming dental work through [facility] Dr Sherryll Burger in [place] under local anesthesia. 1 tooth extraction and fill several cavities.   Patient  has tolerated anesthesia well in the past. Denies trouble with post-op nausea/vomiting, or trouble awakening after surgery. Latest surgical intervention was 06/2019 cholecystectomy.   Occasional headaches and dizziness.   Notes ongoing urinary symptoms - dysuria, urgency, frequency, but without fevers/chills, nausea/vomiting, flank pain.   Denies chest pain, dyspnea, palpitations, leg swelling.  No coughing, abd pain, diarrhea.   DM - continues farxiga 5mg  daily, glipizide 10mg  twice daily with meals, metformin 500mg  XR 2 tablets daily.  She is not taking semglee insulin - does not want to take daily insulin shots.  Lab Results  Component Value Date   HGBA1C 8.0 (A) 08/20/2022    Orthostatic hypotension - notes morning dizziness - she continues toprol XL 25mg  daily and imdur 30mg  1\2 tablet daily as well as farxiga 5mg  daily. She also has lasix 20mg  PRN but has not taken recently.      Relevant past medical, surgical, family and social history reviewed and updated as indicated. Interim medical history since our last visit reviewed. Allergies and medications reviewed and updated. Outpatient Medications Prior to Visit  Medication Sig Dispense Refill   albuterol (PROAIR HFA) 108 (90 Base) MCG/ACT inhaler Inhale 2 puffs into the lungs every 6 (six) hours as needed for wheezing or shortness of breath. 18 g 0   apixaban (ELIQUIS) 5 MG TABS tablet TAKE ONE TABLET BY MOUTH TWICE DAILY 60 tablet 5   atorvastatin (LIPITOR) 40 MG tablet TAKE ONE TABLET BY MOUTH EVERY EVENING (Patient taking differently: Take 40 mg by mouth every evening. TAKE ONE TABLET BY MOUTH EVERY EVENING) 90 tablet 3   clopidogrel (PLAVIX) 75 MG tablet Take 1 tablet (75 mg total) by mouth daily. 90 tablet 3   dapagliflozin propanediol (FARXIGA) 5 MG TABS tablet Take 5 mg by  mouth daily.     dorzolamide-timolol (COSOPT) 2-0.5 % ophthalmic solution Place 1 drop into both eyes 2 (two) times daily.     DULoxetine (CYMBALTA) 60 MG  capsule Take 1 capsule (60 mg total) by mouth daily. 30 capsule 11   furosemide (LASIX) 20 MG tablet Take 1 tablet (20 mg total) by mouth daily as needed for fluid or edema (leg swelling). 30 tablet    gabapentin (NEURONTIN) 300 MG capsule TAKE ONE CAPSULE BY MOUTH TWICE DAILY 180 capsule 1   glipiZIDE (GLUCOTROL) 10 MG tablet Take 1 tablet (10 mg total) by mouth 2 (two) times daily. 180 tablet 3   isosorbide mononitrate (IMDUR) 30 MG 24 hr tablet TAKE 1/2 TABLET BY MOUTH ONCE DAILY 45 tablet 3   latanoprost (XALATAN) 0.005 % ophthalmic solution Place 1 drop into both eyes at bedtime.      metFORMIN (GLUCOPHAGE-XR) 500 MG 24 hr tablet TAKE TWO TABLETS BY MOUTH EVERY MORNING (Patient taking differently: Take 1,000 mg by mouth every morning.) 180 tablet 3   metoprolol succinate (TOPROL XL) 25 MG 24 hr tablet Take 1 tablet (25 mg total) by mouth daily. 90 tablet 3   nitroGLYCERIN (NITROSTAT) 0.4 MG SL tablet Dissolve 1 tab under tongue as needed for chest pain. May repeat every 5 minutes x 2 doses. If no relief call 9-1-1. 25 tablet 2   pantoprazole (PROTONIX) 40 MG tablet TAKE ONE TABLET BY MOUTH TWICE DAILY (Patient taking differently: Take 40 mg by mouth 2 (two) times daily.) 180 tablet 1   phenazopyridine (PYRIDIUM) 100 MG tablet Take 1 tablet (100 mg total) by mouth 2 (two) times daily as needed (dysuria). 10 tablet 0   saccharomyces boulardii (FLORASTOR) 250 MG capsule Take 1 capsule (250 mg total) by mouth 2 (two) times daily. 30 capsule 0   insulin glargine-yfgn (SEMGLEE) 100 UNIT/ML injection Inject 0.1 mLs (10 Units total) into the skin at bedtime. 10 mL 11   No facility-administered medications prior to visit.     Per HPI unless specifically indicated in ROS section below Review of Systems  Objective:  BP 116/76   Pulse 95   Temp 97.8 F (36.6 C) (Temporal)   Ht 5\' 3"  (1.6 m)   Wt 130 lb (59 kg)   SpO2 98%   BMI 23.03 kg/m   Wt Readings from Last 3 Encounters:  08/23/22 130 lb  (59 kg)  08/20/22 130 lb (59 kg)  07/21/22 133 lb (60.3 kg)      Physical Exam Vitals and nursing note reviewed.  Constitutional:      Appearance: Normal appearance. She is not ill-appearing.  HENT:     Head: Normocephalic and atraumatic.     Mouth/Throat:     Mouth: Mucous membranes are moist.     Pharynx: Oropharynx is clear. No oropharyngeal exudate or posterior oropharyngeal erythema.  Eyes:     Extraocular Movements: Extraocular movements intact.     Pupils: Pupils are equal, round, and reactive to light.  Cardiovascular:     Rate and Rhythm: Normal rate and regular rhythm.     Pulses: Normal pulses.     Heart sounds: Normal heart sounds. No murmur heard. Pulmonary:     Effort: Pulmonary effort is normal. No respiratory distress.     Breath sounds: Normal breath sounds. No wheezing, rhonchi or rales.  Abdominal:     General: Bowel sounds are normal. There is no distension.     Palpations: Abdomen is soft. There is no  mass.     Tenderness: There is no abdominal tenderness. There is no guarding or rebound.     Hernia: No hernia is present.  Musculoskeletal:     Right lower leg: No edema.     Left lower leg: No edema.  Skin:    General: Skin is warm and dry.     Capillary Refill: Capillary refill takes less than 2 seconds.  Neurological:     Mental Status: She is alert.  Psychiatric:        Mood and Affect: Mood normal.        Behavior: Behavior normal.       Results for orders placed or performed in visit on 08/23/22  Urine Culture   Specimen: Urine  Result Value Ref Range   MICRO NUMBER: [id number]    SPECIMEN QUALITY: Adequate    Sample Source URINE    STATUS: FINAL    ISOLATE 1: Klebsiella pneumoniae (A)    ISOLATE 2: Enterobacter cloacae complex (A)       Susceptibility   Klebsiella pneumoniae - URINE CULTURE, REFLEX    AMOX/CLAVULANIC <=2 Sensitive     AMPICILLIN 16 Resistant     AMPICILLIN/SULBACTAM 4 Sensitive     CEFAZOLIN* <=4 Not Reportable      *  For infections other than uncomplicated UTI caused by E. coli, K. pneumoniae or P. mirabilis: Cefazolin is resistant if MIC > or = 8 mcg/mL. (Distinguishing susceptible versus intermediate for isolates with MIC < or = 4 mcg/mL requires additional testing.) For uncomplicated UTI caused by E. coli, K. pneumoniae or P. mirabilis: Cefazolin is susceptible if MIC <32 mcg/mL and predicts susceptible to the oral agents cefaclor, cefdinir, cefpodoxime, cefprozil, cefuroxime, cephalexin and loracarbef.     CEFTAZIDIME <=1 Sensitive     CEFEPIME <=1 Sensitive     CEFTRIAXONE <=1 Sensitive     CIPROFLOXACIN <=0.25 Sensitive     LEVOFLOXACIN <=0.12 Sensitive     GENTAMICIN <=1 Sensitive     IMIPENEM <=0.25 Sensitive     NITROFURANTOIN 64 Intermediate     PIP/TAZO <=4 Sensitive     TOBRAMYCIN <=1 Sensitive     TRIMETH/SULFA <=20 Sensitive    Enterobacter cloacae complex - URINE CULTURE NEGATIVE 2    AMOX/CLAVULANIC >=32 Resistant     CEFAZOLIN >=64 Resistant     CEFTAZIDIME <=1 Sensitive     CEFEPIME <=1 Sensitive     CEFTRIAXONE <=1 Sensitive     CIPROFLOXACIN <=0.25 Sensitive     LEVOFLOXACIN <=0.12 Sensitive     GENTAMICIN <=1 Sensitive     IMIPENEM <=0.25 Sensitive     NITROFURANTOIN 32 Sensitive     PIP/TAZO <=4 Sensitive     TOBRAMYCIN <=1 Sensitive     TRIMETH/SULFA* <=20 Sensitive      * For infections other than uncomplicated UTI caused by E. coli, K. pneumoniae or P. mirabilis: Cefazolin is resistant if MIC > or = 8 mcg/mL. (Distinguishing susceptible versus intermediate for isolates with MIC < or = 4 mcg/mL requires additional testing.) For uncomplicated UTI caused by E. coli, K. pneumoniae or P. mirabilis: Cefazolin is susceptible if MIC <32 mcg/mL and predicts susceptible to the oral agents cefaclor, cefdinir, cefpodoxime, cefprozil, cefuroxime, cephalexin and loracarbef. Legend: S = Susceptible  I = Intermediate R = Resistant  NS = Not susceptible * = Not tested   NR = Not reported **NN = See antimicrobic comments   Comprehensive metabolic panel  Result Value Ref Range   Glucose,  Bld 189 (H) 65 - 99 mg/dL   BUN 19 7 - 25 mg/dL   Creat 1.61 (H) 0.96 - 1.00 mg/dL   BUN/Creatinine Ratio 12 6 - 22 (calc)   Sodium 139 135 - 146 mmol/L   Potassium 4.8 3.5 - 5.3 mmol/L   Chloride 106 98 - 110 mmol/L   CO2 19 (L) 20 - 32 mmol/L   Calcium 9.2 8.6 - 10.4 mg/dL   Total Protein 7.3 6.1 - 8.1 g/dL   Albumin 4.0 3.6 - 5.1 g/dL   Globulin 3.3 1.9 - 3.7 g/dL (calc)   AG Ratio 1.2 1.0 - 2.5 (calc)   Total Bilirubin 0.4 0.2 - 1.2 mg/dL   Alkaline phosphatase (APISO) 84 37 - 153 U/L   AST 13 10 - 35 U/L   ALT 10 6 - 29 U/L  CBC with Differential/Platelet  Result Value Ref Range   WBC 9.1 3.8 - 10.8 Thousand/uL   RBC 4.39 3.80 - 5.10 Million/uL   Hemoglobin 12.8 11.7 - 15.5 g/dL   HCT 04.5 40.9 - 81.1 %   MCV 89.3 80.0 - 100.0 fL   MCH 29.2 27.0 - 33.0 pg   MCHC 32.7 32.0 - 36.0 g/dL   RDW 91.4 78.2 - 95.6 %   Platelets 289 140 - 400 Thousand/uL   MPV 11.4 7.5 - 12.5 fL   Neutro Abs 5,151 1,500 - 7,800 cells/uL   Lymphs Abs 2,712 850 - 3,900 cells/uL   Absolute Monocytes 637 200 - 950 cells/uL   Eosinophils Absolute 491 15 - 500 cells/uL   Basophils Absolute 109 0 - 200 cells/uL   Neutrophils Relative % 56.6 %   Total Lymphocyte 29.8 %   Monocytes Relative 7.0 %   Eosinophils Relative 5.4 %   Basophils Relative 1.2 %  Brain natriuretic peptide  Result Value Ref Range   Brain Natriuretic Peptide 104 (H) <100 pg/mL  Vitamin B12  Result Value Ref Range   Vitamin B-12 288 200 - 1,100 pg/mL  POCT Urinalysis Dipstick (Automated)  Result Value Ref Range   Color, UA yellow    Clarity, UA cloudy    Glucose, UA Positive (A) Negative   Bilirubin, UA negative    Ketones, UA negative    Spec Grav, UA 1.020 1.010 - 1.025   Blood, UA 2+    pH, UA 5.5 5.0 - 8.0   Protein, UA Positive (A) Negative   Urobilinogen, UA 0.2 0.2 or 1.0 E.U./dL   Nitrite,  UA positive    Leukocytes, UA Moderate (2+) (A) Negative  Microscopy: WBC TNTC  RBC rare  Bact tr  Casts none  Epi rare  UCx sent   Lab Results  Component Value Date   HGBA1C 8.0 (A) 08/20/2022    EKG - NSR rate 80s, RBBB, LAD with LAFB, normal intervals, similar to prior  Assessment & Plan:   Problem List Items Addressed This Visit     MDD (major depressive disorder), recurrent episode, moderate ([name])    She continues duloxetine 60mg  daily.  S/p recent behavioral health urgent care evaluation, was provided with resources to establish with psychology and psychiatry. I will go ahead and refer.       Relevant Orders   Ambulatory referral to Psychology   Ambulatory referral to Psychiatry   Type 2 diabetes mellitus with neurological complications ([name])    Recent A1c 8% - she continues farxiga 5mg  daily, glipizide 10mg  BID with meals, metformin XR 500mg  2 tab daily. She is  not taking insulin, does not want daily injection medicine.  Consider GLP1RA, caution in possible gastroparesis.  Stop farxiga as per below.       S/P CABG x 3   History of cerebrovascular accident (CVA) in adulthood   Autonomic orthostatic hypotension    Longstanding h/o autonomic neuropathy, likely diabetic.  Positive orthostatics in office - recommend holding isosorbide 15mg  daily, if chest pain returns then may restart.       Chronic diastolic CHF (congestive heart failure) ([name])    Seems euvolemic. No recent lasix use.       Vitamin B12 deficiency    Update B12 off replacement      Relevant Orders   Vitamin B12 (Completed)   Stage 3 chronic kidney disease due to type 2 diabetes mellitus ([name])    Update renal function - eGFR = 32.       Coronary artery disease of native artery of native heart with stable angina pectoris ([name])    Followed by cardiology      Atrial flutter with rapid ventricular response ([name])    On eliquis and plavix - blood thinner management will be through cardiology .        Recurrent UTI    Notes ongoing urinary symptoms of dysuria, urgency and frequency.  H/o recurrent UTIs.  UA/micro frankly infected - start macrobid 5d course and send urine culture.  Discussed urology evaluation for recurrent UTI. Stop Marcelline Deist in interim.  Renal US 07/2022 WNL  Recent UTIs: 02/2022: >100k Klebsiella, 60k E coli treated with ceftriaxone in hospital 03/2022: >100k enterobacter treated with cipro 07/2022: hospitalization for presumed UTI/dehydration although UCx negative - treated with cefepime --> oral keflex 08/2022: Klebsiella and Enterobacter UTI treated with macrobid bid 5d and ciprofloxacin 250mg  bid 5d course - referred to urology      Relevant Orders   Ambulatory referral to Urology   Chronic anticoagulation    On plavix + eliquis since 07/2019 (LLE DVT and presumed PE)  Both continued in h/o CABG, remote stroke noted on MRI 2021 This is managed by cardiology.       Pre-op evaluation - Primary    RCRI = 3 (CAD, CHF, stroke history). She is diabetic but not taking her insulin.  Check EKG and labs today.  She is on eliquis and plavix for h/o stroke, atrial flutter, ASCVD s/p bypass and stents. Anticoagulant management will need to come from cardiology who prescribes these meds.       Relevant Orders   EKG 12-Lead (Completed)   Comprehensive metabolic panel (Completed)   CBC with Differential/Platelet (Completed)   Brain natriuretic peptide (Completed)   Other Visit Diagnoses     Dysuria       Relevant Orders   POCT Urinalysis Dipstick (Automated) (Completed)        Meds ordered this encounter  Medications   nitrofurantoin, macrocrystal-monohydrate, (MACROBID) 100 MG capsule    Sig: Take 1 capsule (100 mg total) by mouth 2 (two) times daily for 5 days.    Dispense:  10 capsule    Refill:  0   ondansetron (ZOFRAN) 4 MG tablet    Sig: Take 1 tablet (4 mg total) by mouth every 8 (eight) hours as needed for nausea or vomiting.    Dispense:  20  tablet    Refill:  0   ciprofloxacin (CIPRO) 250 MG tablet    Sig: Take 1 tablet (250 mg total) by mouth 2 (two) times daily for 5 days.  Dispense:  10 tablet    Refill:  0   cyanocobalamin (VITAMIN B12) 1000 MCG tablet    Sig: Take 1 tablet (1,000 mcg total) by mouth daily.    Orders Placed This Encounter  Procedures   Urine Culture   Comprehensive metabolic panel   CBC with Differential/Platelet   Brain natriuretic peptide   Vitamin B12   Ambulatory referral to Psychology    Referral Priority:   Routine    Referral Type:   Psychiatric    Referral Reason:   Specialty Services Required    Requested Specialty:   Psychology    Number of Visits Requested:   1   Ambulatory referral to Psychiatry    Referral Priority:   Routine    Referral Type:   Psychiatric    Referral Reason:   Specialty Services Required    Requested Specialty:   Psychiatry    Number of Visits Requested:   1   Ambulatory referral to Urology    Referral Priority:   Routine    Referral Type:   Consultation    Referral Reason:   Specialty Services Required    Requested Specialty:   Urology    Number of Visits Requested:   1   POCT Urinalysis Dipstick (Automated)   EKG 12-Lead    Patient Instructions  Urinalysis and labwork today  EKG today  I will refer you to psychiatrist and psychologist.  I will refer you to urologist for ongoing urine infections. Stop farxiga at this time. Hold lasix at this time.  Urine suspicious for infection - take macrobid antibiotic for 5 days. We will be in touch with urine culture results. Hold isosorbide (imdur) 1/2 tablet at this time. If any chest pain, may restart this medicine.  Continue drinking plenty of water.  Return in 2 weeks for labs to repeat urine test ensure infection is resolved.  Return in 1 month for follow up visit.   Follow up plan: Return in about 1 month (around 09/22/2022), or if symptoms worsen or fail to improve, for follow up visit.  Eustaquio Boyden, MD

## 2022-08-24 LAB — COMPREHENSIVE METABOLIC PANEL
AG Ratio: 1.2 (calc) (ref 1.0–2.5)
ALT: 10 U/L (ref 6–29)
AST: 13 U/L (ref 10–35)
Albumin: 4 g/dL (ref 3.6–5.1)
Alkaline phosphatase (APISO): 84 U/L (ref 37–153)
BUN/Creatinine Ratio: 12 (calc) (ref 6–22)
BUN: 19 mg/dL (ref 7–25)
CO2: 19 mmol/L — ABNORMAL LOW (ref 20–32)
Calcium: 9.2 mg/dL (ref 8.6–10.4)
Chloride: 106 mmol/L (ref 98–110)
Creat: 1.56 mg/dL — ABNORMAL HIGH (ref 0.60–1.00)
Globulin: 3.3 g/dL (calc) (ref 1.9–3.7)
Glucose, Bld: 189 mg/dL — ABNORMAL HIGH (ref 65–99)
Potassium: 4.8 mmol/L (ref 3.5–5.3)
Sodium: 139 mmol/L (ref 135–146)
Total Bilirubin: 0.4 mg/dL (ref 0.2–1.2)
Total Protein: 7.3 g/dL (ref 6.1–8.1)

## 2022-08-24 LAB — CBC WITH DIFFERENTIAL/PLATELET
Eosinophils Absolute: 491 cells/uL (ref 15–500)
Eosinophils Relative: 5.4 %
Hemoglobin: 12.8 g/dL (ref 11.7–15.5)
MCH: 29.2 pg (ref 27.0–33.0)
MCV: 89.3 fL (ref 80.0–100.0)
Platelets: 289 10*3/uL (ref 140–400)
RBC: 4.39 10*6/uL (ref 3.80–5.10)
RDW: 15 % (ref 11.0–15.0)
Total Lymphocyte: 29.8 %
WBC: 9.1 10*3/uL (ref 3.8–10.8)

## 2022-08-24 LAB — BRAIN NATRIURETIC PEPTIDE: Brain Natriuretic Peptide: 104 pg/mL — ABNORMAL HIGH (ref <100)

## 2022-08-24 LAB — URINE CULTURE: SPECIMEN QUALITY:: ADEQUATE

## 2022-08-24 LAB — VITAMIN B12: Vitamin B-12: 288 pg/mL (ref 200–1100)

## 2022-08-27 ENCOUNTER — Encounter: Payer: 59 | Admitting: Pharmacist

## 2022-08-27 LAB — URINE CULTURE: MICRO NUMBER:: 15084561

## 2022-08-28 NOTE — Assessment & Plan Note (Addendum)
Longstanding h/o autonomic neuropathy, likely diabetic.  Positive orthostatics in office - recommend holding isosorbide 15mg  daily, if chest pain returns then may restart.

## 2022-08-28 NOTE — Assessment & Plan Note (Signed)
Update B12 off replacement

## 2022-08-28 NOTE — Assessment & Plan Note (Addendum)
Notes ongoing urinary symptoms of dysuria, urgency and frequency.  H/o recurrent UTIs.  UA/micro frankly infected - start macrobid 5d course and send urine culture.  Discussed urology evaluation for recurrent UTI. Stop Marcelline Deist in interim.  Renal US 07/2022 WNL  Recent UTIs: 02/2022: >100k Klebsiella, 60k E coli treated with ceftriaxone in hospital 03/2022: >100k enterobacter treated with cipro 07/2022: hospitalization for presumed UTI/dehydration although UCx negative - treated with cefepime --> oral keflex 08/2022: Klebsiella and Enterobacter UTI treated with macrobid bid 5d and ciprofloxacin 250mg  bid 5d course - referred to urology

## 2022-08-28 NOTE — Assessment & Plan Note (Addendum)
RCRI = 3 (CAD, CHF, stroke history). She is diabetic but not taking her insulin.  Check EKG and labs today.  She is on eliquis and plavix for h/o stroke, atrial flutter, ASCVD s/p bypass and stents. Anticoagulant management will need to come from cardiology who prescribes these meds.

## 2022-08-28 NOTE — Assessment & Plan Note (Signed)
Seems euvolemic. No recent lasix use.

## 2022-08-28 NOTE — Assessment & Plan Note (Addendum)
Followed by cardiology 

## 2022-08-28 NOTE — Assessment & Plan Note (Addendum)
She continues duloxetine 60mg  daily.  S/p recent behavioral health urgent care evaluation, was provided with resources to establish with psychology and psychiatry. I will go ahead and refer.

## 2022-08-28 NOTE — Assessment & Plan Note (Addendum)
Recent A1c 8% - she continues farxiga 5mg  daily, glipizide 10mg  BID with meals, metformin XR 500mg  2 tab daily. She is not taking insulin, does not want daily injection medicine.  Consider GLP1RA, caution in possible gastroparesis.  Stop farxiga as per below.

## 2022-08-29 MED ORDER — VITAMIN B-12 1000 MCG PO TABS: 1000.0000 ug | ORAL_TABLET | Freq: Every day | ORAL | Status: DC

## 2022-08-29 MED ORDER — CIPROFLOXACIN HCL 250 MG PO TABS: 250.0000 mg | ORAL_TABLET | Freq: Two times a day (BID) | ORAL | 0 refills | Status: AC

## 2022-09-07 ENCOUNTER — Encounter: Payer: Self-pay | Admitting: Family Medicine

## 2022-09-07 NOTE — Assessment & Plan Note (Addendum)
Update renal function - eGFR = 32.

## 2022-09-07 NOTE — Assessment & Plan Note (Signed)
On plavix + eliquis since 07/2019 (LLE DVT and presumed PE)  Both continued in h/o CABG, remote stroke noted on MRI 2021 This is managed by cardiology.

## 2022-09-07 NOTE — Assessment & Plan Note (Signed)
On eliquis and plavix - blood thinner management will be through cardiology .

## 2022-09-10 ENCOUNTER — Ambulatory Visit: Payer: Self-pay

## 2022-09-10 NOTE — Patient Instructions (Signed)
Visit Information  Thank you for taking time to visit with me today. Please don't hesitate to contact me if I can be of assistance to you.   Following are the goals we discussed today:   Goals Addressed             This Visit's Progress    Management of chronic health conditions.       Interventions Today    Flowsheet Row Most Recent Value  Chronic Disease   Chronic disease during today's visit Diabetes, Other  [recurrent UTI's, MDD]  General Interventions   General Interventions Discussed/Reviewed General Interventions Discussed, Doctor Visits, Labs, [employer] of current treatment plan for DM/MDD, recurrent UTI's and patients adherence to plan as established by provider. Assessed for ongoing UTI symptoms and blood sugars. Discussed and offered referral to SW to assist with arranging counseling.]  Labs Hgb A1c every 6 months  [discussed most recent Hgb A1c results and goal]  Doctor Visits Discussed/Reviewed Doctor Visits Discussed  [reviewed upcoming/ provider visits. Advised to notify PCP of ongoing UTI symptoms even if antibiotics completed.]  Education Interventions   Education Provided Provided Printed Education, Provided Education  Provided Verbal Education On Blood Sugar Monitoring, Other  [provided advisement for managing UTI's]  Mental Health Interventions   Mental Health Discussed/Reviewed Mental Health Discussed, Depression  [Offered follow up with Child psychotherapist. Discussed counseling/ psychiatric follow up.  Confirmed patient continues to take antidepressant medication as prescribed.]  Pharmacy Interventions   Pharmacy Dicussed/Reviewed Pharmacy Topics Discussed  [medications reviewed and compliance discussed. Advised patient to check prepackaged medicine to make sure farxiga was removed.  If not removed advised to call pharmacy and notify.]              Our next appointment is by telephone on 10/02/22 at 2:30 pm  Please call the care guide team  at [phone] if you need to cancel or reschedule your appointment.   If you are experiencing a Mental Health or Behavioral Health Crisis or need someone to talk to, please call the Suicide and Crisis Lifeline: 988 call 1-800-273-TALK (toll free, 24 hour hotline)  Patient verbalizes understanding of instructions and care plan provided today and agrees to view in MyChart. Active MyChart status and patient understanding of how to access instructions and care plan via MyChart confirmed with patient.     [name] RN,BSN,CCM [facility] Care Coordination [phone] direct line Urinary Tract Infection, Adult A urinary tract infection (UTI) is an infection of any part of the urinary tract. The urinary tract includes: The kidneys. The ureters. The bladder. The urethra. These organs make, store, and get rid of pee (urine) in the body. What are the causes? This infection is caused by germs (bacteria) in your genital area. These germs grow and cause swelling (inflammation) of your urinary tract. What increases the risk? The following factors may make you more likely to develop this condition: Using a small, thin tube (catheter) to drain pee. Not being able to control when you pee or poop (incontinence). Being female. If you are female, these things can increase the risk: Using these methods to prevent pregnancy: A medicine that kills sperm (spermicide). A device that blocks sperm (diaphragm). Having low levels of a female hormone (estrogen). Being pregnant. You are more likely to develop this condition if: You have genes that add to your risk. You are sexually active. You take antibiotic medicines. You have trouble peeing because of: A prostate that is bigger than normal, if  you are female. A blockage in the part of your body that drains pee from the bladder. A kidney stone. A nerve condition that affects your bladder. Not getting enough to drink. Not peeing often enough. You have other  conditions, such as: Diabetes. A weak disease-fighting system (immune system). Sickle cell disease. Gout. Injury of the spine. What are the signs or symptoms? Symptoms of this condition include: Needing to pee right away. Peeing small amounts often. Pain or burning when peeing. Blood in the pee. Pee that smells bad or not like normal. Trouble peeing. Pee that is cloudy. Fluid coming from the vagina, if you are female. Pain in the belly or lower back. Other symptoms include: Vomiting. Not feeling hungry. Feeling mixed up (confused). This may be the first symptom in older adults. Being tired and grouchy (irritable). A fever. Watery poop (diarrhea). How is this treated? Taking antibiotic medicine. Taking other medicines. Drinking enough water. In some cases, you may need to see a specialist. Follow these instructions at home:  Medicines Take over-the-counter and prescription medicines only as told by your doctor. If you were prescribed an antibiotic medicine, take it as told by your doctor. Do not stop taking it even if you start to feel better. General instructions Make sure you: Pee until your bladder is empty. Do not hold pee for a long time. Empty your bladder after sex. Wipe from front to back after peeing or pooping if you are a female. Use each tissue one time when you wipe. Drink enough fluid to keep your pee pale yellow. Keep all follow-up visits. Contact a doctor if: You do not get better after 1-2 days. Your symptoms go away and then come back. Get help right away if: You have very bad back pain. You have very bad pain in your lower belly. You have a fever. You have chills. You feeling like you will vomit or you vomit. Summary A urinary tract infection (UTI) is an infection of any part of the urinary tract. This condition is caused by germs in your genital area. There are many risk factors for a UTI. Treatment includes antibiotic medicines. Drink enough  fluid to keep your pee pale yellow. This information is not intended to replace advice given to you by your health care provider. Make sure you discuss any questions you have with your health care provider. Document Revised: 10/03/2019 Document Reviewed: 10/08/2019 Elsevier Patient Education  2024 [employer].

## 2022-09-10 NOTE — Patient Outreach (Signed)
  Care Coordination   Initial Visit Note   09/10/2022 Name: Maria Fox MRN: [id number] DOB: [birth year]  Maria Fox is a 77 y.o. year old female who sees Eustaquio Boyden, MD for primary care. I spoke with  Maria Fox by phone today.  What matters to the patients health and wellness today?   Patient reports recent hospitalization for UTI's.  She states she is not taking [place] as far as she know. She states she will check her pre-packaged medications to make sure the Marcelline Deist was removed.  Patient states she continues to have ongoing mild UTI symptoms: slight burning when urinating and some discomfort/ pressure when she stops urinating.  Patient reports completing prescribed antibiotic treatment.   Per chart review patients Hgb A1c checked on 08/26/22 was 8.0. She reports her goal is 7.  She states Maria Fox who lives with her provides her transportation to appointments. Patient declined RNCM offer for social work referral for assistance with counseling/ psychiatric follow up.  Per chart review patient provided psychiatric resources for therapy and medication management.    Goals Addressed             This Visit's Progress    Management of chronic health conditions.       Interventions Today    Flowsheet Row Most Recent Value  Chronic Disease   Chronic disease during today's visit Diabetes, Other  [recurrent UTI's, MDD]  General Interventions   General Interventions Discussed/Reviewed General Interventions Discussed, Doctor Visits, Labs, [employer] of current treatment plan for DM/MDD, recurrent UTI's and patients adherence to plan as established by provider. Assessed for ongoing UTI symptoms and blood sugars. Discussed and offered referral to SW to assist with arranging counseling.]  Labs Hgb A1c every 6 months  [discussed most recent Hgb A1c results and goal]  Doctor Visits Discussed/Reviewed Doctor Visits Discussed  [reviewed upcoming/ provider visits. Advised to  notify PCP of ongoing UTI symptoms even if antibiotics completed.]  Education Interventions   Education Provided Provided Printed Education, Provided Education  Provided Verbal Education On Blood Sugar Monitoring, Other  [provided advisement for managing UTI's]  Mental Health Interventions   Mental Health Discussed/Reviewed Mental Health Discussed, Depression  [Offered follow up with Child psychotherapist. Discussed counseling/ psychiatric follow up.  Confirmed patient continues to take antidepressant medication as prescribed.]  Pharmacy Interventions   Pharmacy Dicussed/Reviewed Pharmacy Topics Discussed  [medications reviewed and compliance discussed. Advised patient to check prepackaged medicine to make sure farxiga was removed.  If not removed advised to call pharmacy and notify.]              SDOH assessments and interventions completed:  Yes  SDOH Interventions Today    Flowsheet Row Most Recent Value  SDOH Interventions   Food Insecurity Interventions Intervention Not Indicated  Housing Interventions Intervention Not Indicated  Transportation Interventions Intervention Not Indicated        Care Coordination Interventions:  No, not indicated   Follow up plan: Follow up call scheduled for 10/02/22    Encounter Outcome:  Pt. Visit Completed   [name] RN,BSN,CCM [facility] Care Coordination [phone] direct line

## 2022-09-20 ENCOUNTER — Ambulatory Visit (HOSPITAL_COMMUNITY)
Admission: EM | Admit: 2022-09-20 | Discharge: 2022-09-20 | Disposition: A | Discharge status: Home / Self Care | Payer: 59 | Attending: Physician Assistant | Admitting: Physician Assistant

## 2022-09-20 ENCOUNTER — Encounter (HOSPITAL_COMMUNITY): Payer: Self-pay

## 2022-09-20 DIAGNOSIS — J340 Abscess, furuncle and carbuncle of nose: Secondary | ICD-10-CM

## 2022-09-20 MED ORDER — DOXYCYCLINE HYCLATE 100 MG PO CAPS: 100.0000 mg | ORAL_CAPSULE | Freq: Two times a day (BID) | ORAL | 0 refills | Status: AC

## 2022-09-20 MED ORDER — BACITRACIN 500 UNIT/GM EX OINT: 1.0000 | TOPICAL_OINTMENT | Freq: Two times a day (BID) | CUTANEOUS | 0 refills | Status: DC

## 2022-09-20 NOTE — ED Triage Notes (Signed)
Patient here today with c/o sinus pressure X 3 days. No other symptoms. No sick contacts. No recent travel.
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
## 2022-09-20 NOTE — Discharge Instructions (Signed)
Apply topical ointment twice per day Take antibiotic as prescribed Recommend warm compress If no improvement or symptoms become worse follow up with your primary care physician

## 2022-10-02 ENCOUNTER — Ambulatory Visit: Payer: Self-pay

## 2022-10-02 ENCOUNTER — Telehealth: Payer: Self-pay | Admitting: Cardiology

## 2022-10-02 NOTE — Telephone Encounter (Signed)
   Primary Cardiologist: Armanda Magic, MD  Chart reviewed as part of pre-operative protocol coverage. Simple dental extractions (1-2 teeth) are considered low risk procedures per guidelines and generally do not require any specific cardiac clearance. It is also generally accepted that for simple extractions and dental cleanings, there is no need to interrupt blood thinner therapy.   SBE prophylaxis is not required for the patient.  I will route this recommendation to the requesting party via Epic fax function and remove from pre-op pool.  Please call with questions.  Levi Aland, NP-C  10/02/2022, 8:47 AM [address] Office [phone] Fax [phone]

## 2022-10-02 NOTE — Patient Outreach (Signed)
  Care Coordination   Follow Up Visit Note   10/02/2022 Name: LYANN HAGSTROM MRN: [id number] DOB: 05-20-1945  BANITA LEHN is a 77 y.o. year old female who sees Eustaquio Boyden, MD for primary care. I spoke with  [name] by phone today.  What matters to the patients health and wellness today?  Patient states she is not taking her farxiga.  She states she continues to have UTI symptoms with most frequent symptom being pain at the end of urination.  Patient states, " it feels like something is sticking me or coming out."  Patient states she has not received a call regarding her urology referral.  This RNCM contacted [facility] and spoke with Vernona Rieger.  Appointment scheduled with Vernona Rieger for patient on 11/05/22 at 8:30 am.  Attempted to return call to patient to notify her of urology appointment date and time.  Unable to reach her. HIPAA compliant message left requesting return call.     Goals Addressed             This Visit's Progress    Management of chronic health conditions.       Interventions Today    Flowsheet Row Most Recent Value  Chronic Disease   Chronic disease during today's visit Diabetes, Other  [recurrent UTI's]  General Interventions   General Interventions Discussed/Reviewed General Interventions Reviewed, Doctor Visits  [evaluation of current treatment plan for DM, UTI's  and patients adherence to plan as established by provider. Assessed for ongoing UTI symptoms.]  Doctor Visits Discussed/Reviewed Doctor Visits Reviewed  Eliseo Gum called and new patient appointment scheduled for patient. Reviewed upcoming provider visits.]  Pharmacy Interventions   Pharmacy Dicussed/Reviewed Pharmacy Topics Reviewed  [medications reviewed and compliance discussed.  Inquired if patient restarted farxiga.]              SDOH assessments and interventions completed:  No     Care Coordination Interventions:  Yes, provided   Follow up plan: Follow up call scheduled  for 11/01/22    Encounter Outcome:  Pt. Visit Completed   [name] RN,BSN,CCM [facility] Care Coordination [phone] direct line

## 2022-10-02 NOTE — Telephone Encounter (Signed)
   Pre-operative Risk Assessment    Patient Name: Maria Fox  DOB: 06-01-1945 MRN: [id number]     Request for Surgical Clearance    Procedure:  Dental Extraction - Amount of Teeth to be Pulled:  1  Date of Surgery:  Clearance 10/02/22                                 Surgeon:  Dr. Irving Shows Surgeon's Group or Practice Name:  [facility] Phone number:  [phone]  Fax number:  [phone]   Type of Clearance Requested:   - Pharmacy:  Hold Clopidogrel (Plavix) and Apixaban (Eliquis)     Type of Anesthesia:  Local    Additional requests/questions:   Caller stated patient is having extraction today (7/24) at 1:30 pm and wants blood thinners held.  Caller stated she will also be faxing over form that needs to be completed, signed and faxed back.  Signed, Annetta Maw   10/02/2022, 8:27 AM

## 2022-10-02 NOTE — Patient Instructions (Signed)
Visit Information  Thank you for taking time to visit with me today. Please don't hesitate to contact me if I can be of assistance to you.   Following are the goals we discussed today:   Goals Addressed             This Visit's Progress    Management of chronic health conditions.       Interventions Today    Flowsheet Row Most Recent Value  Chronic Disease   Chronic disease during today's visit Diabetes, Other  [recurrent UTI's]  General Interventions   General Interventions Discussed/Reviewed General Interventions Reviewed, Doctor Visits  [evaluation of current treatment plan for DM, UTI's  and patients adherence to plan as established by provider. Assessed for ongoing UTI symptoms.]  Doctor Visits Discussed/Reviewed Doctor Visits Reviewed  Eliseo Gum called and new patient appointment scheduled for patient. Reviewed upcoming provider visits.]  Pharmacy Interventions   Pharmacy Dicussed/Reviewed Pharmacy Topics Reviewed  [medications reviewed and compliance discussed.  Inquired if patient restarted farxiga.]              Our next appointment is by telephone on 11/01/22 at 2:30 pm  Please call the care guide team at [phone] if you need to cancel or reschedule your appointment.   If you are experiencing a Mental Health or Behavioral Health Crisis or need someone to talk to, please call the Suicide and Crisis Lifeline: 988 call 1-800-273-TALK (toll free, 24 hour hotline)  The patient verbalized understanding of instructions, educational materials, and care plan provided today and agreed to receive a mailed copy of patient instructions, educational materials, and care plan.   [name] RN,BSN,CCM [facility] Care Coordination [phone] direct line

## 2022-10-07 ENCOUNTER — Encounter: Payer: Self-pay | Admitting: *Deleted

## 2022-10-07 ENCOUNTER — Ambulatory Visit: Payer: 59 | Admitting: Cardiology

## 2022-11-01 ENCOUNTER — Ambulatory Visit: Payer: Self-pay

## 2022-11-01 NOTE — Patient Instructions (Signed)
Visit Information  Thank you for taking time to visit with me today. Please don't hesitate to contact me if I can be of assistance to you.   Following are the goals we discussed today:   Goals Addressed             This Visit's Progress    Management of chronic health conditions.       Interventions Today    Flowsheet Row Most Recent Value  Chronic Disease   Chronic disease during today's visit Other  [recurrent UTI's]  General Interventions   General Interventions Discussed/Reviewed General Interventions Reviewed, Doctor Visits  [evaluation of current treatment plan for UTI's and patients adherence to plan as established by provider.  Assessed for UTI symptoms.]  Doctor Visits Discussed/Reviewed Doctor Visits Reviewed  [reviewed scheduled urology appointment date and time with patient. Confirmed patient has transportation to apointment.]  Education Interventions   Education Provided Provided Education  [Advised to remain hydrated and take pyridium medication as prescribed.]  Pharmacy Interventions   Pharmacy Dicussed/Reviewed Pharmacy Topics Reviewed  [medication reviwed and compliance discussed.]              Our next appointment is by telephone on 11/19/22 at 2:30 pm  Please call the care guide team at [phone] if you need to cancel or reschedule your appointment.   If you are experiencing a Mental Health or Behavioral Health Crisis or need someone to talk to, please call the Suicide and Crisis Lifeline: 988 call 1-800-273-TALK (toll free, 24 hour hotline)  The patient verbalized understanding of instructions, educational materials, and care plan provided today and agreed to receive a mailed copy of patient instructions, educational materials, and care plan.   [name] RN,BSN,CCM [facility] Care Coordination [phone] direct line

## 2022-11-01 NOTE — Patient Outreach (Signed)
  Care Coordination   Follow Up Visit Note   11/01/2022 Name: ICESIS FOLLMER MRN: [id number] DOB: 29-Jan-1946  Maria Fox is a 77 y.o. year old female who sees Eustaquio Boyden, MD for primary care. I spoke with  [name] by phone today.  What matters to the patients health and wellness today?  Patient states she continues to have UTI symptoms.  She reports pain urination especially at end of stream. She states, "it feels like something is sticking up in me with I urinate."  Patient states she continues to take her pyridium as prescribed. Patient states she had forgotten about the upcoming urology visit.  Patient confirmed she has someone to take her to her doctor appointments.     Goals Addressed             This Visit's Progress    Management of chronic health conditions.       Interventions Today    Flowsheet Row Most Recent Value  Chronic Disease   Chronic disease during today's visit Other  [recurrent UTI's]  General Interventions   General Interventions Discussed/Reviewed General Interventions Reviewed, Doctor Visits  [evaluation of current treatment plan for UTI's and patients adherence to plan as established by provider.  Assessed for UTI symptoms.]  Doctor Visits Discussed/Reviewed Doctor Visits Reviewed  [reviewed scheduled urology appointment date and time with patient. Confirmed patient has transportation to apointment.]  Education Interventions   Education Provided Provided Education  [Advised to remain hydrated and take pyridium medication as prescribed.]  Pharmacy Interventions   Pharmacy Dicussed/Reviewed Pharmacy Topics Reviewed  [medication reviwed and compliance discussed.]              SDOH assessments and interventions completed:  No     Care Coordination Interventions:  Yes, provided   Follow up plan: Follow up call scheduled for 11/19/22    Encounter Outcome:  Pt. Visit Completed   [name] RN,BSN,CCM [facility] Care Coordination [phone]  direct line

## 2022-11-05 ENCOUNTER — Other Ambulatory Visit: Payer: Self-pay | Admitting: Family Medicine

## 2022-11-05 ENCOUNTER — Other Ambulatory Visit: Payer: Self-pay | Admitting: Cardiology

## 2022-11-05 DIAGNOSIS — K219 Gastro-esophageal reflux disease without esophagitis: Secondary | ICD-10-CM

## 2022-11-05 DIAGNOSIS — I4892 Unspecified atrial flutter: Secondary | ICD-10-CM

## 2022-11-05 NOTE — Telephone Encounter (Signed)
Prescription refill request for Eliquis received. Indication: Aflutter Last office visit: 04/03/22 Mayford Knife)  Scr:1.56 (08/23/22)  Age: 77 Weight: 60.3kg  Eliquis 5mg  BID is appropriate dose at this time. Dose will need to be adjusted at next refill d/t dosing criteria because pt turns 77 years old on January (age/Scr). Refill sent.

## 2022-11-07 ENCOUNTER — Other Ambulatory Visit: Payer: Self-pay | Admitting: Family Medicine

## 2022-11-08 ENCOUNTER — Other Ambulatory Visit: Payer: Self-pay | Admitting: Cardiology

## 2022-11-08 ENCOUNTER — Other Ambulatory Visit: Payer: Self-pay | Admitting: Family Medicine

## 2022-11-08 DIAGNOSIS — I4892 Unspecified atrial flutter: Secondary | ICD-10-CM

## 2022-11-08 NOTE — Telephone Encounter (Signed)
Eliquis 5mg  refill request received. Patient is 77 years old, weight-60.3kg, Crea-1.56 on 08/23/22, Diagnosis-Aflutter, DVT, PE, and last seen by Dr. Mayford Knife on 04/05/22. Dose is appropriate based on dosing criteria. Will send in refill to requested pharmacy.

## 2022-11-08 NOTE — Telephone Encounter (Signed)
Too soon. Rx sent on 11/05/22, #60/10 to [facility] mail order pharmacy.   Request denied.

## 2022-11-11 ENCOUNTER — Other Ambulatory Visit: Payer: Self-pay | Admitting: Family Medicine

## 2022-11-11 ENCOUNTER — Other Ambulatory Visit: Payer: Self-pay | Admitting: Cardiology

## 2022-11-11 DIAGNOSIS — I4892 Unspecified atrial flutter: Secondary | ICD-10-CM

## 2022-11-11 DIAGNOSIS — K219 Gastro-esophageal reflux disease without esophagitis: Secondary | ICD-10-CM

## 2022-11-13 NOTE — Telephone Encounter (Signed)
[facility] is closing. New rxs for both meds- #60/10 to [facility].  Requests denied.

## 2022-11-14 ENCOUNTER — Other Ambulatory Visit: Payer: Self-pay | Admitting: Cardiology

## 2022-11-18 ENCOUNTER — Other Ambulatory Visit: Payer: Self-pay | Admitting: Family Medicine

## 2022-11-19 ENCOUNTER — Ambulatory Visit: Payer: Self-pay

## 2022-11-19 NOTE — Patient Instructions (Signed)
Visit Information  Thank you for taking time to visit with me today. Please don't hesitate to contact me if I can be of assistance to you.   Following are the goals we discussed today:   Goals Addressed             This Visit's Progress    Management of chronic health conditions.       Interventions Today    Flowsheet Row Most Recent Value  Chronic Disease   Chronic disease during today's visit Other  [recurrent UTI's]  General Interventions   General Interventions Discussed/Reviewed General Interventions Reviewed, Doctor Visits  [evaluation of current treatment plan for mentioned health condition and patients adherence to plan as established by provider. Assessed for ongoing UTI symptoms.]  Doctor Visits Discussed/Reviewed Doctor Visits Reviewed  [name] to call [facility] to rescheduled missed appointment.  Confirmed patient had contact phone number and office address.]  Education Interventions   Education Provided --  [Advised to stay hydrated, consider drinking cranberry juice, take pyridium as prescribed for symptom management of UTI's]  Pharmacy Interventions   Pharmacy Dicussed/Reviewed Pharmacy Topics Reviewed, Referral to Pharmacist  [reviewed medications. Advised to take medications as prescribed. Message sent to practice pharmacist regarding medication management.]              Our next appointment is by telephone on 12/12/22 at 2 pm  Please call the care guide team at [phone] if you need to cancel or reschedule your appointment.   If you are experiencing a Mental Health or Behavioral Health Crisis or need someone to talk to, please call the Suicide and Crisis Lifeline: 988 call 1-800-273-TALK (toll free, 24 hour hotline)  The patient verbalized understanding of instructions, educational materials, and care plan provided today and agreed to receive a mailed copy of patient instructions, educational materials, and care plan.   [name]  RN,BSN,CCM [facility] Care Coordination [phone] direct line

## 2022-11-19 NOTE — Telephone Encounter (Signed)
ERx 

## 2022-11-19 NOTE — Patient Outreach (Signed)
  Care Coordination   Follow Up Visit Note   11/19/2022 Name: Maria Fox MRN: [id number] DOB: 02/16/1946  Maria Fox is a 77 y.o. year old female who sees Eustaquio Boyden, MD for primary care. I spoke with  [name] by phone today.  What matters to the patients health and wellness today?  Patient reports she continues to have ongoing UTI symptoms. She states she takes pyridium which only provides minor relief of symptoms.  Patient states she thinks she may have missed her urology appointment.  Upon chart review patients urology appointment was scheduled for 11/05/22.  Patient reports receiving some of her medications from new mail order pharmacy [facility].  Patient states she is missing her 2 eye drops and nausea medication.    Goals Addressed             This Visit's Progress    Management of chronic health conditions.       Interventions Today    Flowsheet Row Most Recent Value  Chronic Disease   Chronic disease during today's visit Other  [recurrent UTI's]  General Interventions   General Interventions Discussed/Reviewed General Interventions Reviewed, Doctor Visits  [evaluation of current treatment plan for mentioned health condition and patients adherence to plan as established by provider. Assessed for ongoing UTI symptoms.]  Doctor Visits Discussed/Reviewed Doctor Visits Reviewed  [name] to call [facility] to rescheduled missed appointment.  Confirmed patient had contact phone number and office address.]  Education Interventions   Education Provided --  [Advised to stay hydrated, consider drinking cranberry juice, take pyridium as prescribed for symptom management of UTI's]  Pharmacy Interventions   Pharmacy Dicussed/Reviewed Pharmacy Topics Reviewed, Referral to Pharmacist  [reviewed medications. Advised to take medications as prescribed. Message sent to practice pharmacist regarding medication management.]              SDOH assessments and  interventions completed:  No     Care Coordination Interventions:  Yes, provided   Follow up plan: Follow up call scheduled for 12/12/22    Encounter Outcome:  Patient Visit Completed   [name] RN,BSN,CCM [facility] Care Coordination [phone] direct line

## 2022-11-19 NOTE — Telephone Encounter (Signed)
Last seen in office on 08/23/22. You wanted to follow up in one month do not see where that was done. Patient does have appointment 11/22/22 ok to refill?

## 2022-12-03 ENCOUNTER — Telehealth: Payer: Self-pay | Admitting: Family Medicine

## 2022-12-03 ENCOUNTER — Ambulatory Visit (INDEPENDENT_AMBULATORY_CARE_PROVIDER_SITE_OTHER): Payer: 59

## 2022-12-03 VITALS — Ht 63.0 in | Wt 133.0 lb

## 2022-12-03 DIAGNOSIS — Z0000 Encounter for general adult medical examination without abnormal findings: Secondary | ICD-10-CM

## 2022-12-03 NOTE — Patient Instructions (Signed)
Maria Fox , Thank you for taking time to come for your Medicare Wellness Visit. I appreciate your ongoing commitment to your health goals. Please review the following plan we discussed and let me know if I can assist you in the future.   Referrals/Orders/Follow-Ups/Clinician Recommendations: Aim for 30 minutes of exercise or brisk walking, 6-8 glasses of water, and 5 servings of fruits and vegetables each day.   This is a list of the screening recommended for you and due dates:  Health Maintenance  Topic Date Due   Zoster (Shingles) Vaccine (1 of 2) Never done   Pneumonia Vaccine (2 of 2 - PPSV23 or PCV20) 12/26/2017   Mammogram  03/21/2020   Complete foot exam   01/26/2022   Flu Shot  10/10/2022   Yearly kidney health urinalysis for diabetes  10/31/2022   COVID-19 Vaccine (2 - 2023-24 season) 11/10/2022   DTaP/Tdap/Td vaccine (2 - Tdap) 08/09/2023*   Eye exam for diabetics  01/03/2023   Hemoglobin A1C  02/19/2023   Yearly kidney function blood test for diabetes  08/23/2023   Medicare Annual Wellness Visit  12/03/2023   DEXA scan (bone density measurement)  Completed   Hepatitis C Screening  Completed   HPV Vaccine  Aged Out   Colon Cancer Screening  Discontinued  *Topic was postponed. The date shown is not the original due date.    Advanced directives: (Provided) Advance directive discussed with you today. I have provided a copy for you to complete at home and have notarized. Once this is complete, please bring a copy in to our office so we can scan it into your chart. Information on Advanced Care Planning can be found at [place] [facility] [place] Advance Health Care Directives Advance Health Care Directives ([email or web address]    Next Medicare Annual Wellness Visit scheduled for next year: Yes  Insert Preventive Care attachment Insert FALL PREVENTION attachment if needed
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
## 2022-12-03 NOTE — Telephone Encounter (Signed)
She just completed medicare wellness visit with health advisor. Please call and schedule CPE with me as she's overdue.

## 2022-12-04 NOTE — Telephone Encounter (Signed)
Patient scheduled.

## 2022-12-04 NOTE — Telephone Encounter (Signed)
Noted  

## 2022-12-07 ENCOUNTER — Other Ambulatory Visit: Payer: Self-pay | Admitting: Family Medicine

## 2022-12-07 DIAGNOSIS — E785 Hyperlipidemia, unspecified: Secondary | ICD-10-CM

## 2022-12-09 ENCOUNTER — Inpatient Hospital Stay (HOSPITAL_COMMUNITY)
Admission: EM | Admit: 2022-12-09 | Discharge: 2022-12-13 | DRG: 690 | Disposition: A | Discharge status: Home / Self Care | Payer: 59 | Attending: Internal Medicine | Admitting: Internal Medicine

## 2022-12-09 ENCOUNTER — Encounter (HOSPITAL_COMMUNITY): Payer: Self-pay

## 2022-12-09 ENCOUNTER — Other Ambulatory Visit: Payer: Self-pay

## 2022-12-09 DIAGNOSIS — G6289 Other specified polyneuropathies: Secondary | ICD-10-CM

## 2022-12-09 DIAGNOSIS — Z955 Presence of coronary angioplasty implant and graft: Secondary | ICD-10-CM

## 2022-12-09 DIAGNOSIS — E785 Hyperlipidemia, unspecified: Secondary | ICD-10-CM | POA: Diagnosis present

## 2022-12-09 DIAGNOSIS — I4892 Unspecified atrial flutter: Secondary | ICD-10-CM | POA: Diagnosis not present

## 2022-12-09 DIAGNOSIS — N1831 Chronic kidney disease, stage 3a: Secondary | ICD-10-CM | POA: Diagnosis present

## 2022-12-09 DIAGNOSIS — D631 Anemia in chronic kidney disease: Secondary | ICD-10-CM | POA: Diagnosis not present

## 2022-12-09 DIAGNOSIS — Z881 Allergy status to other antibiotic agents status: Secondary | ICD-10-CM

## 2022-12-09 DIAGNOSIS — E1142 Type 2 diabetes mellitus with diabetic polyneuropathy: Secondary | ICD-10-CM | POA: Diagnosis not present

## 2022-12-09 DIAGNOSIS — I5032 Chronic diastolic (congestive) heart failure: Secondary | ICD-10-CM | POA: Diagnosis present

## 2022-12-09 DIAGNOSIS — K219 Gastro-esophageal reflux disease without esophagitis: Secondary | ICD-10-CM | POA: Diagnosis not present

## 2022-12-09 DIAGNOSIS — R531 Weakness: Secondary | ICD-10-CM | POA: Diagnosis not present

## 2022-12-09 DIAGNOSIS — N179 Acute kidney failure, unspecified: Secondary | ICD-10-CM | POA: Diagnosis not present

## 2022-12-09 DIAGNOSIS — Z8249 Family history of ischemic heart disease and other diseases of the circulatory system: Secondary | ICD-10-CM

## 2022-12-09 DIAGNOSIS — J45909 Unspecified asthma, uncomplicated: Secondary | ICD-10-CM | POA: Diagnosis present

## 2022-12-09 DIAGNOSIS — I130 Hypertensive heart and chronic kidney disease with heart failure and stage 1 through stage 4 chronic kidney disease, or unspecified chronic kidney disease: Secondary | ICD-10-CM | POA: Diagnosis not present

## 2022-12-09 DIAGNOSIS — Z825 Family history of asthma and other chronic lower respiratory diseases: Secondary | ICD-10-CM

## 2022-12-09 DIAGNOSIS — G629 Polyneuropathy, unspecified: Secondary | ICD-10-CM

## 2022-12-09 DIAGNOSIS — H409 Unspecified glaucoma: Secondary | ICD-10-CM | POA: Diagnosis not present

## 2022-12-09 DIAGNOSIS — Z885 Allergy status to narcotic agent status: Secondary | ICD-10-CM

## 2022-12-09 DIAGNOSIS — E1122 Type 2 diabetes mellitus with diabetic chronic kidney disease: Secondary | ICD-10-CM | POA: Diagnosis present

## 2022-12-09 DIAGNOSIS — Z79899 Other long term (current) drug therapy: Secondary | ICD-10-CM

## 2022-12-09 DIAGNOSIS — E119 Type 2 diabetes mellitus without complications: Secondary | ICD-10-CM

## 2022-12-09 DIAGNOSIS — D649 Anemia, unspecified: Secondary | ICD-10-CM | POA: Diagnosis present

## 2022-12-09 DIAGNOSIS — Z7984 Long term (current) use of oral hypoglycemic drugs: Secondary | ICD-10-CM

## 2022-12-09 DIAGNOSIS — E1169 Type 2 diabetes mellitus with other specified complication: Secondary | ICD-10-CM | POA: Diagnosis present

## 2022-12-09 DIAGNOSIS — Z8679 Personal history of other diseases of the circulatory system: Secondary | ICD-10-CM

## 2022-12-09 DIAGNOSIS — Z91030 Bee allergy status: Secondary | ICD-10-CM

## 2022-12-09 DIAGNOSIS — Z8673 Personal history of transient ischemic attack (TIA), and cerebral infarction without residual deficits: Secondary | ICD-10-CM

## 2022-12-09 DIAGNOSIS — Z951 Presence of aortocoronary bypass graft: Secondary | ICD-10-CM

## 2022-12-09 DIAGNOSIS — N1832 Chronic kidney disease, stage 3b: Secondary | ICD-10-CM | POA: Diagnosis present

## 2022-12-09 DIAGNOSIS — R339 Retention of urine, unspecified: Secondary | ICD-10-CM | POA: Diagnosis present

## 2022-12-09 DIAGNOSIS — Z7901 Long term (current) use of anticoagulants: Secondary | ICD-10-CM

## 2022-12-09 DIAGNOSIS — F411 Generalized anxiety disorder: Secondary | ICD-10-CM | POA: Diagnosis present

## 2022-12-09 DIAGNOSIS — Z808 Family history of malignant neoplasm of other organs or systems: Secondary | ICD-10-CM

## 2022-12-09 DIAGNOSIS — I951 Orthostatic hypotension: Secondary | ICD-10-CM | POA: Diagnosis not present

## 2022-12-09 DIAGNOSIS — I10 Essential (primary) hypertension: Secondary | ICD-10-CM | POA: Diagnosis not present

## 2022-12-09 DIAGNOSIS — Z86718 Personal history of other venous thrombosis and embolism: Secondary | ICD-10-CM | POA: Diagnosis not present

## 2022-12-09 DIAGNOSIS — I081 Rheumatic disorders of both mitral and tricuspid valves: Secondary | ICD-10-CM | POA: Diagnosis not present

## 2022-12-09 DIAGNOSIS — Z833 Family history of diabetes mellitus: Secondary | ICD-10-CM

## 2022-12-09 DIAGNOSIS — N3000 Acute cystitis without hematuria: Principal | ICD-10-CM | POA: Diagnosis present

## 2022-12-09 DIAGNOSIS — Z8616 Personal history of COVID-19: Secondary | ICD-10-CM | POA: Diagnosis not present

## 2022-12-09 DIAGNOSIS — N183 Chronic kidney disease, stage 3 unspecified: Secondary | ICD-10-CM | POA: Diagnosis present

## 2022-12-09 DIAGNOSIS — Z882 Allergy status to sulfonamides status: Secondary | ICD-10-CM

## 2022-12-09 DIAGNOSIS — I2510 Atherosclerotic heart disease of native coronary artery without angina pectoris: Secondary | ICD-10-CM | POA: Diagnosis not present

## 2022-12-09 DIAGNOSIS — Z800 Family history of malignant neoplasm of digestive organs: Secondary | ICD-10-CM

## 2022-12-09 DIAGNOSIS — Z8049 Family history of malignant neoplasm of other genital organs: Secondary | ICD-10-CM

## 2022-12-09 DIAGNOSIS — N390 Urinary tract infection, site not specified: Principal | ICD-10-CM

## 2022-12-09 DIAGNOSIS — Z83438 Family history of other disorder of lipoprotein metabolism and other lipidemia: Secondary | ICD-10-CM

## 2022-12-09 DIAGNOSIS — Z7902 Long term (current) use of antithrombotics/antiplatelets: Secondary | ICD-10-CM

## 2022-12-09 DIAGNOSIS — Z880 Allergy status to penicillin: Secondary | ICD-10-CM

## 2022-12-09 LAB — CBC WITH DIFFERENTIAL/PLATELET
Abs Immature Granulocytes: 0.03 10*3/uL (ref 0.00–0.07)
Basophils Absolute: 0.1 10*3/uL (ref 0.0–0.1)
Basophils Relative: 1 %
Eosinophils Absolute: 0.3 10*3/uL (ref 0.0–0.5)
Eosinophils Relative: 5 %
HCT: 34.3 % — ABNORMAL LOW (ref 36.0–46.0)
Hemoglobin: 11 g/dL — ABNORMAL LOW (ref 12.0–15.0)
Immature Granulocytes: 0 %
Lymphocytes Relative: 27 %
Lymphs Abs: 1.9 10*3/uL (ref 0.7–4.0)
MCH: 29.9 pg (ref 26.0–34.0)
MCHC: 32.1 g/dL (ref 30.0–36.0)
MCV: 93.2 fL (ref 80.0–100.0)
Monocytes Absolute: 0.5 10*3/uL (ref 0.1–1.0)
Monocytes Relative: 7 %
Neutro Abs: 4.2 10*3/uL (ref 1.7–7.7)
Neutrophils Relative %: 60 %
Platelets: 210 10*3/uL (ref 150–400)
RBC: 3.68 MIL/uL — ABNORMAL LOW (ref 3.87–5.11)
RDW: 13.2 % (ref 11.5–15.5)
WBC: 6.9 10*3/uL (ref 4.0–10.5)
nRBC: 0 % (ref 0.0–0.2)

## 2022-12-09 LAB — COMPREHENSIVE METABOLIC PANEL
ALT: 11 U/L (ref 0–44)
AST: 14 U/L — ABNORMAL LOW (ref 15–41)
Albumin: 3.6 g/dL (ref 3.5–5.0)
Alkaline Phosphatase: 66 U/L (ref 38–126)
Anion gap: 8 (ref 5–15)
BUN: 15 mg/dL (ref 8–23)
CO2: 22 mmol/L (ref 22–32)
Calcium: 9 mg/dL (ref 8.9–10.3)
Chloride: 108 mmol/L (ref 98–111)
Creatinine, Ser: 1.27 mg/dL — ABNORMAL HIGH (ref 0.44–1.00)
GFR, Estimated: 43 mL/min — ABNORMAL LOW (ref >60)
Glucose, Bld: 149 mg/dL — ABNORMAL HIGH (ref 70–99)
Potassium: 4.3 mmol/L (ref 3.5–5.1)
Sodium: 138 mmol/L (ref 135–145)
Total Bilirubin: 0.7 mg/dL (ref 0.3–1.2)
Total Protein: 7 g/dL (ref 6.5–8.1)

## 2022-12-09 LAB — URINALYSIS, ROUTINE W REFLEX MICROSCOPIC
Bacteria, UA: NONE SEEN
Bilirubin Urine: NEGATIVE
Glucose, UA: NEGATIVE mg/dL
Hgb urine dipstick: NEGATIVE
Ketones, ur: NEGATIVE mg/dL
Nitrite: NEGATIVE
Protein, ur: NEGATIVE mg/dL
Specific Gravity, Urine: 1.004 — ABNORMAL LOW (ref 1.005–1.030)
pH: 6 (ref 5.0–8.0)

## 2022-12-09 LAB — LIPASE, BLOOD: Lipase: 29 U/L (ref 11–51)

## 2022-12-09 LAB — GLUCOSE, CAPILLARY: Glucose-Capillary: 151 mg/dL — ABNORMAL HIGH (ref 70–99)

## 2022-12-09 MED ORDER — DULOXETINE HCL 30 MG PO CPEP
60.0000 mg | ORAL_CAPSULE | Freq: Every day | ORAL | Status: DC
  Administered 2022-12-10: 60 mg via ORAL
  Filled 2022-12-09: qty 2

## 2022-12-09 MED ORDER — FUROSEMIDE 20 MG PO TABS
20.0000 mg | ORAL_TABLET | Freq: Every day | ORAL | Status: DC
  Administered 2022-12-09 – 2022-12-10 (×2): 20 mg via ORAL
  Filled 2022-12-09 (×2): qty 1

## 2022-12-09 MED ORDER — SODIUM CHLORIDE 0.9 % IV SOLN: 250.0000 mL | INTRAVENOUS | Status: DC | PRN

## 2022-12-09 MED ORDER — APIXABAN 5 MG PO TABS
5.0000 mg | ORAL_TABLET | Freq: Two times a day (BID) | ORAL | Status: DC
  Administered 2022-12-09 – 2022-12-12 (×7): 5 mg via ORAL
  Filled 2022-12-09 (×7): qty 1

## 2022-12-09 MED ORDER — ALBUTEROL SULFATE (2.5 MG/3ML) 0.083% IN NEBU: 2.5000 mg | INHALATION_SOLUTION | Freq: Four times a day (QID) | RESPIRATORY_TRACT | Status: DC | PRN

## 2022-12-09 MED ORDER — METOPROLOL SUCCINATE ER 25 MG PO TB24
25.0000 mg | ORAL_TABLET | Freq: Every day | ORAL | Status: DC
  Administered 2022-12-10 – 2022-12-12 (×3): 25 mg via ORAL
  Filled 2022-12-09 (×3): qty 1

## 2022-12-09 MED ORDER — ONDANSETRON HCL 4 MG/2ML IJ SOLN: 4.0000 mg | Freq: Four times a day (QID) | INTRAMUSCULAR | Status: DC | PRN

## 2022-12-09 MED ORDER — SODIUM CHLORIDE 0.9% FLUSH
3.0000 mL | Freq: Two times a day (BID) | INTRAVENOUS | Status: DC
  Administered 2022-12-10 – 2022-12-12 (×5): 3 mL via INTRAVENOUS

## 2022-12-09 MED ORDER — INSULIN ASPART 100 UNIT/ML IJ SOLN
0.0000 [IU] | Freq: Every day | INTRAMUSCULAR | Status: DC
  Filled 2022-12-09: qty 0.05

## 2022-12-09 MED ORDER — SODIUM CHLORIDE 0.9 % IV SOLN
1.0000 g | Freq: Once | INTRAVENOUS | Status: AC
  Administered 2022-12-09: 1 g via INTRAVENOUS
  Filled 2022-12-09: qty 10

## 2022-12-09 MED ORDER — HYDRALAZINE HCL 20 MG/ML IJ SOLN: 5.0000 mg | Freq: Three times a day (TID) | INTRAMUSCULAR | Status: DC | PRN

## 2022-12-09 MED ORDER — ACETAMINOPHEN 325 MG PO TABS
650.0000 mg | ORAL_TABLET | Freq: Four times a day (QID) | ORAL | Status: DC | PRN
  Administered 2022-12-10 – 2022-12-12 (×7): 650 mg via ORAL
  Filled 2022-12-09 (×7): qty 2

## 2022-12-09 MED ORDER — ACETAMINOPHEN 650 MG RE SUPP: 650.0000 mg | Freq: Four times a day (QID) | RECTAL | Status: DC | PRN

## 2022-12-09 MED ORDER — ALBUTEROL SULFATE HFA 108 (90 BASE) MCG/ACT IN AERS: 2.0000 | INHALATION_SPRAY | Freq: Four times a day (QID) | RESPIRATORY_TRACT | Status: DC | PRN

## 2022-12-09 MED ORDER — SODIUM CHLORIDE 0.9% FLUSH: 3.0000 mL | INTRAVENOUS | Status: DC | PRN

## 2022-12-09 MED ORDER — SODIUM CHLORIDE 0.9 % IV BOLUS
500.0000 mL | Freq: Once | INTRAVENOUS | Status: AC
  Administered 2022-12-09: 500 mL via INTRAVENOUS

## 2022-12-09 MED ORDER — SODIUM CHLORIDE 0.9 % IV SOLN
1.0000 g | INTRAVENOUS | Status: DC
  Administered 2022-12-10 – 2022-12-12 (×3): 1 g via INTRAVENOUS
  Filled 2022-12-09 (×3): qty 10

## 2022-12-09 MED ORDER — PHENAZOPYRIDINE HCL 100 MG PO TABS: 100.0000 mg | ORAL_TABLET | Freq: Two times a day (BID) | ORAL | Status: DC | PRN

## 2022-12-09 MED ORDER — NITROGLYCERIN 0.4 MG SL SUBL: 0.4000 mg | SUBLINGUAL_TABLET | SUBLINGUAL | Status: DC | PRN

## 2022-12-09 MED ORDER — GABAPENTIN 300 MG PO CAPS
300.0000 mg | ORAL_CAPSULE | Freq: Two times a day (BID) | ORAL | Status: DC
  Administered 2022-12-09 – 2022-12-12 (×7): 300 mg via ORAL
  Filled 2022-12-09 (×7): qty 1

## 2022-12-09 MED ORDER — PANTOPRAZOLE SODIUM 40 MG PO TBEC
40.0000 mg | DELAYED_RELEASE_TABLET | Freq: Two times a day (BID) | ORAL | Status: DC
  Administered 2022-12-10 – 2022-12-12 (×6): 40 mg via ORAL
  Filled 2022-12-09 (×6): qty 1

## 2022-12-09 MED ORDER — INSULIN ASPART 100 UNIT/ML IJ SOLN
0.0000 [IU] | Freq: Three times a day (TID) | INTRAMUSCULAR | Status: DC
  Administered 2022-12-10: 4 [IU] via SUBCUTANEOUS
  Administered 2022-12-10: 3 [IU] via SUBCUTANEOUS
  Administered 2022-12-10: 4 [IU] via SUBCUTANEOUS
  Administered 2022-12-11: 3 [IU] via SUBCUTANEOUS
  Filled 2022-12-09: qty 0.06

## 2022-12-09 MED ORDER — SODIUM CHLORIDE 0.9% FLUSH
3.0000 mL | Freq: Two times a day (BID) | INTRAVENOUS | Status: DC
  Administered 2022-12-09 – 2022-12-12 (×6): 3 mL via INTRAVENOUS

## 2022-12-09 MED ORDER — ISOSORBIDE MONONITRATE ER 30 MG PO TB24
15.0000 mg | ORAL_TABLET | Freq: Every day | ORAL | Status: DC
  Administered 2022-12-10 – 2022-12-12 (×3): 15 mg via ORAL
  Filled 2022-12-09 (×3): qty 1

## 2022-12-09 MED ORDER — ATORVASTATIN CALCIUM 40 MG PO TABS
40.0000 mg | ORAL_TABLET | Freq: Every evening | ORAL | Status: DC
  Administered 2022-12-09 – 2022-12-12 (×4): 40 mg via ORAL
  Filled 2022-12-09 (×4): qty 1

## 2022-12-09 MED ORDER — CLOPIDOGREL BISULFATE 75 MG PO TABS
75.0000 mg | ORAL_TABLET | Freq: Every day | ORAL | Status: DC
  Administered 2022-12-10 – 2022-12-12 (×3): 75 mg via ORAL
  Filled 2022-12-09 (×3): qty 1

## 2022-12-09 MED ORDER — ONDANSETRON HCL 4 MG PO TABS: 4.0000 mg | ORAL_TABLET | Freq: Four times a day (QID) | ORAL | Status: DC | PRN

## 2022-12-09 NOTE — ED Provider Notes (Signed)
[place] EMERGENCY DEPARTMENT AT [facility] Provider Note   CSN: [id number] Arrival date & time: 12/09/22  1354     History  Chief Complaint  Patient presents with   Urinary Retention    Maria Fox is a 77 y.o. female.  77 year old female with past medical history of recurrent urinary tract infections in the past as well as diabetes and coronary artery disease presenting to the emergency department today with increased urinary frequency and urgency.  The patient states this been going now for the past week.  She denies any nausea or vomiting.  She states that she does occasionally have pain in her flanks.  She denies any associated fevers.  She states that she does feel like it is difficult to urinate at times and when she does she does have dysuria.        Home Medications Prior to Admission medications   Medication Sig Start Date End Date Taking? Authorizing Provider  acyclovir ointment (ZOVIRAX) 5 % Apply 1 Application topically every 3 (three) hours as needed (for fever blisters).   Yes [provider]  albuterol (PROAIR HFA) 108 (90 Base) MCG/ACT inhaler Inhale 2 puffs into the lungs every 6 (six) hours as needed for wheezing or shortness of breath. 05/10/20  Yes Eustaquio Boyden, MD  apixaban (ELIQUIS) 5 MG TABS tablet Take 1 tablet (5 mg total) by mouth 2 (two) times daily. 11/08/22  Yes Turner, Cornelious Bryant, MD  atorvastatin (LIPITOR) 40 MG tablet TAKE ONE TABLET BY MOUTH EVERY EVENING Patient taking differently: Take 40 mg by mouth every evening. TAKE ONE TABLET BY MOUTH EVERY EVENING 01/22/22  Yes Eustaquio Boyden, MD  clopidogrel (PLAVIX) 75 MG tablet Take 1 tablet (75 mg total) by mouth daily. 04/17/22  Yes Turner, Traci R, MD  dorzolamide-timolol (COSOPT) 2-0.5 % ophthalmic solution Place 1 drop into both eyes 2 (two) times daily.   Yes [provider]  DULoxetine (CYMBALTA) 60 MG capsule Take 1 capsule (60 mg total) by mouth daily. 04/22/22   Yes Eustaquio Boyden, MD  gabapentin (NEURONTIN) 300 MG capsule TAKE ONE CAPSULE BY MOUTH TWICE DAILY Patient taking differently: Take 300 mg by mouth in the morning and at bedtime. 08/14/22  Yes Eustaquio Boyden, MD  glipiZIDE (GLUCOTROL) 10 MG tablet TAKE ONE (1) TABLET BY MOUTH TWICE DAILY *REFILL REQUEST* Patient taking differently: Take 10 mg by mouth 2 (two) times daily before a meal. 11/05/22  Yes Eustaquio Boyden, MD  isosorbide mononitrate (IMDUR) 30 MG 24 hr tablet TAKE 1/2 TABLET BY MOUTH ONCE DAILY Patient taking differently: Take 15 mg by mouth daily. 04/17/22  Yes Turner, Cornelious Bryant, MD  latanoprost (XALATAN) 0.005 % ophthalmic solution Place 1 drop into both eyes at bedtime.  07/14/18  Yes [provider]  metFORMIN (GLUCOPHAGE-XR) 500 MG 24 hr tablet TAKE 2 TABLETS BY MOUTH EVERY MORNING 11/19/22  Yes Eustaquio Boyden, MD  metoprolol succinate (TOPROL XL) 25 MG 24 hr tablet Take 1 tablet (25 mg total) by mouth daily. 04/05/22  Yes Turner, Cornelious Bryant, MD  nitroGLYCERIN (NITROSTAT) 0.4 MG SL tablet Place 1 tablet (0.4 mg total) under the tongue every 5 (five) minutes as needed for chest pain. 11/14/22  Yes Turner, Cornelious Bryant, MD  ondansetron (ZOFRAN) 4 MG tablet Take 1 tablet (4 mg total) by mouth every 8 (eight) hours as needed for nausea or vomiting. 08/23/22  Yes Eustaquio Boyden, MD  pantoprazole (PROTONIX) 40 MG tablet TAKE 1 TABLET BY MOUTH TWICE DAILY *  REFILL REQUEST* Patient taking differently: Take 40 mg by mouth 2 (two) times daily before a meal. 11/05/22  Yes Eustaquio Boyden, MD  phenazopyridine (PYRIDIUM) 100 MG tablet Take 1 tablet (100 mg total) by mouth 2 (two) times daily as needed (dysuria). 07/29/22  Yes Regalado, Belkys A, MD  traMADol (ULTRAM) 50 MG tablet Take 50 mg by mouth every 6 (six) hours as needed for moderate pain.   Yes [provider]  TYLENOL 500 MG tablet Take 500-1,000 mg by mouth every 6 (six) hours as needed for mild pain or headache.   Yes  [provider]  bacitracin 500 UNIT/GM ointment Apply 1 Application topically 2 (two) times daily. Patient not taking: Reported on 12/09/2022 09/20/22   Ward, Tylene Fantasia, PA-C  cyanocobalamin (VITAMIN B12) 1000 MCG tablet Take 1 tablet (1,000 mcg total) by mouth daily. Patient not taking: Reported on 12/09/2022 08/29/22   Eustaquio Boyden, MD  furosemide (LASIX) 20 MG tablet Take 1 tablet (20 mg total) by mouth daily as needed for fluid or edema (leg swelling). Patient not taking: Reported on 12/09/2022 07/27/20   Arrien, York Ram, MD  saccharomyces boulardii (FLORASTOR) 250 MG capsule Take 1 capsule (250 mg total) by mouth 2 (two) times daily. Patient not taking: Reported on 12/09/2022 07/26/22   Hartley Barefoot A, MD      Allergies    Bee venom, Mushroom extract complex, Penicillins, Codeine, Sulfa antibiotics, Iohexol, and Erythromycin base    Review of Systems   Review of Systems  Genitourinary:  Positive for difficulty urinating, dysuria, flank pain, frequency and urgency.  All other systems reviewed and are negative.   Physical Exam Updated Vital Signs BP (!) 144/93 (BP Location: Left Arm)   Pulse 78   Temp 98.9 F (37.2 C) (Oral)   Resp 20   Ht 5\' 3"  (1.6 m)   Wt 60.3 kg   SpO2 99%   BMI 23.56 kg/m  Physical Exam Vitals and nursing note reviewed.   Gen: NAD Eyes: PERRL, EOMI HEENT: no oropharyngeal swelling Neck: trachea midline Resp: clear to auscultation bilaterally Card: RRR, no murmurs, rubs, or gallops Abd: nontender, nondistended Extremities: no calf tenderness, no edema Vascular: 2+ radial pulses bilaterally, 2+ DP pulses bilaterally Skin: no rashes Psyc: acting appropriately   ED Results / Procedures / Treatments   Labs (all labs ordered are listed, but only abnormal results are displayed) Labs Reviewed  CBC WITH DIFFERENTIAL/PLATELET - Abnormal; Notable for the following components:      Result Value   RBC 3.68 (*)    Hemoglobin 11.0  (*)    HCT 34.3 (*)    All other components within normal limits  COMPREHENSIVE METABOLIC PANEL - Abnormal; Notable for the following components:   Glucose, Bld 149 (*)    Creatinine, Ser 1.27 (*)    AST 14 (*)    GFR, Estimated 43 (*)    All other components within normal limits  URINALYSIS, ROUTINE W REFLEX MICROSCOPIC - Abnormal; Notable for the following components:   Color, Urine COLORLESS (*)    Specific Gravity, Urine 1.004 (*)    Leukocytes,Ua MODERATE (*)    All other components within normal limits  GLUCOSE, CAPILLARY - Abnormal; Notable for the following components:   Glucose-Capillary 151 (*)    All other components within normal limits  URINE CULTURE  LIPASE, BLOOD  COMPREHENSIVE METABOLIC PANEL  CBC    EKG None  Radiology No results found.  Procedures Procedures    Medications  Ordered in ED Medications  sodium chloride flush (NS) 0.9 % injection 3 mL (0 mLs Intravenous Duplicate 12/09/22 2238)  sodium chloride flush (NS) 0.9 % injection 3 mL (3 mLs Intravenous Given 12/09/22 2238)  sodium chloride flush (NS) 0.9 % injection 3 mL (has no administration in time range)  0.9 %  sodium chloride infusion (has no administration in time range)  acetaminophen (TYLENOL) tablet 650 mg (has no administration in time range)    Or  acetaminophen (TYLENOL) suppository 650 mg (has no administration in time range)  cefTRIAXone (ROCEPHIN) 1 g in sodium chloride 0.9 % 100 mL IVPB (has no administration in time range)  ondansetron (ZOFRAN) tablet 4 mg (has no administration in time range)    Or  ondansetron (ZOFRAN) injection 4 mg (has no administration in time range)  atorvastatin (LIPITOR) tablet 40 mg (40 mg Oral Given 12/09/22 2237)  isosorbide mononitrate (IMDUR) 24 hr tablet 15 mg (has no administration in time range)  metoprolol succinate (TOPROL-XL) 24 hr tablet 25 mg (has no administration in time range)  nitroGLYCERIN (NITROSTAT) SL tablet 0.4 mg (has no  administration in time range)  DULoxetine (CYMBALTA) DR capsule 60 mg (has no administration in time range)  pantoprazole (PROTONIX) EC tablet 40 mg (has no administration in time range)  apixaban (ELIQUIS) tablet 5 mg (5 mg Oral Given 12/09/22 2237)  clopidogrel (PLAVIX) tablet 75 mg (has no administration in time range)  gabapentin (NEURONTIN) capsule 300 mg (300 mg Oral Given 12/09/22 2237)  insulin aspart (novoLOG) injection 0-6 Units (has no administration in time range)  insulin aspart (novoLOG) injection 0-5 Units ( Subcutaneous Not Given 12/09/22 2225)  hydrALAZINE (APRESOLINE) injection 5 mg (has no administration in time range)  albuterol (PROVENTIL) (2.5 MG/3ML) 0.083% nebulizer solution 2.5 mg (has no administration in time range)  furosemide (LASIX) tablet 20 mg (20 mg Oral Given 12/09/22 2237)  sodium chloride 0.9 % bolus 500 mL (0 mLs Intravenous Stopped 12/09/22 2010)  cefTRIAXone (ROCEPHIN) 1 g in sodium chloride 0.9 % 100 mL IVPB (0 g Intravenous Stopped 12/09/22 2224)    ED Course/ Medical Decision Making/ A&P                                 Medical Decision Making 77 year old female with past medical history of diabetes, coronary artery disease, and TIA in the past presenting to the emergency department today with symptoms concerning for potential urinary tract infection.  I will further evaluate patient here with basic labs to evaluate her renal function.  Will obtain urinalysis to evaluate for urinary tract infection.  The patient did have a CT scan back in May which was unremarkable.  Will hold off on additional imaging at this time.  I will BladderScan her to evaluate for urinary retention.  The patient's urinalysis does have findings concerning for urinary tract infection.  She is given Rocephin.  The patient is feeling very generally weak and does not feel safe going home and that she lives with her elderly husband.  Calls placed hospital service for admission.  Amount  and/or Complexity of Data Reviewed Labs: ordered.  Risk Decision regarding hospitalization.           Final Clinical Impression(s) / ED Diagnoses Final diagnoses:  Urinary tract infection without hematuria, site unspecified  Generalized weakness    Rx / DC Orders ED Discharge Orders     None  Durwin Glaze, MD 12/09/22 [phone]

## 2022-12-09 NOTE — ED Triage Notes (Addendum)
Patient stated she cannot fully empty her bladder. Last time she urinated was 7am. Stated she has burning urination. Complaining of left wrist pain and she wants her left big toenail removed.

## 2022-12-09 NOTE — ED Notes (Signed)
ED TO INPATIENT HANDOFF REPORT  Name/Age/Gender Maria Fox 77 y.o. female  Code Status    Code Status Orders  (From admission, onward)           Start     Ordered   12/09/22 2109  Full code  Continuous       Question:  By:  Answer:  Consent: discussion documented in EHR   12/09/22 2110           Code Status History     Date Active Date Inactive Code Status Order ID Comments User Context   07/21/2022 1449 07/29/2022 2325 Full Code [id number]  Maryln Gottron, MD ED   03/06/2022 1430 03/12/2022 2107 DNR [id number]  Zannie Cove, MD Inpatient   02/23/2022 2133 03/06/2022 1430 Full Code [id number]  Jacques Navy, MD ED   07/29/2020 1902 08/05/2020 2218 Full Code [id number]  Emeline General, MD ED   07/10/2020 2312 07/27/2020 2037 Full Code [id number]  Rometta Emery, MD ED   07/01/2020 1634 07/04/2020 1713 Full Code [id number]  Corrin Parker, PA-C Inpatient   07/07/2019 0858 07/23/2019 2148 Full Code [id number]  Jonah Blue, MD ED   11/12/2017 1649 11/13/2017 1647 Full Code [id number]  Iran Ouch, MD Inpatient   05/27/2016 1914 05/28/2016 1344 Full Code [id number]  Vanetta Mulders, MD ED   05/27/2016 1500 05/27/2016 1914 Full Code [id number]  Cristina Gong, PA ED   04/21/2016 2142 04/23/2016 2200 Full Code [id number]  Arthor Captain, PA-C ED   02/16/2016 1852 02/19/2016 0135 Full Code [id number]  Jerelyn Scott, MD ED   07/26/2015 0014 07/27/2015 1655 Full Code [id number]  Clydie Braun, MD ED   07/09/2015 0730 07/18/2015 1620 Full Code [id number]  Gwenyth Bender, NP ED   09/01/2014 1831 07/09/2015 0730 Full Code [id number]  Margit Hanks, MD Outpatient   08/27/2014 1917 09/01/2014 1831 DNR [id number]  Margit Hanks, MD Outpatient   08/07/2014 1821 08/22/2014 1943 Full Code [id number]  Esperanza Sheets, MD Inpatient   07/08/2014 0153 07/18/2014 1757 Full Code [id number]  Rolly Salter, MD Inpatient   06/02/2012 0324 06/03/2012 1618 Full Code [id number]  Eduard Clos, MD  Inpatient   06/23/2011 1933 06/24/2011 1703 Full Code [id number]  Margie Ege, RN Inpatient   05/27/2011 0442 05/27/2011 2145 Full Code [id number]  Leanne Chang ED   05/27/2011 0414 05/27/2011 0442 Full Code [id number]  Joya Gaskins, MD ED      Advance Directive Documentation    Flowsheet Row Most Recent Value  Type of Advance Directive Healthcare Power of Attorney, Living will  Pre-existing out of facility DNR order (yellow form or pink MOST form) --  "MOST" Form in Place? --       Home/SNF/Other Home  Chief Complaint Acute cystitis [N30.00]  Level of Care/Admitting Diagnosis ED Disposition     ED Disposition  Admit   Condition  --   Comment  Hospital Area: [facility]  HOSPITAL [[id number]]  Level of Care: Telemetry [5]  Admit to tele based on following criteria: Other see comments  Comments: History of ventricular tachycardia  May place patient in observation at [facility] or [name] if equivalent level of care is available:: No  Covid Evaluation: Asymptomatic - no recent exposure (last 10 days) testing not required  Diagnosis: Acute cystitis [595.0.ICD-9-CM]  Admitting Physician: Tereasa Coop [[id number]]  Attending Physician: Tereasa Coop [[id number]]          Medical History  Past Medical History:  Diagnosis Date   Acute cholecystitis 07/07/2019   Acute deep vein thrombosis (DVT) of left tibial vein ([name]) 07/11/2019   Acute left PCA stroke ([name]) 07/13/2015   Angioedema    Atrial flutter ([name])    Autoimmune deficiency syndrome ([name])    CAD (coronary artery disease), native coronary artery 06/2014   Chronic diastolic CHF (congestive heart failure) ([name]) 08/27/2014   Chronic kidney disease, stage 3b ([name])    Closed fracture of maxilla ([name])    COVID-19 virus infection 03/2020   CVA (cerebral infarction) 07/2014   bilateral corona radiata - periCABG   Dermatitis    eval [name] 2011: eczema, eval [name] 2011: bx negative for lichen simplex  or derm herpetiformis   DM (diabetes mellitus), type 2, uncontrolled w/neurologic complication 06/02/2012   ?autonomic neuropathy, gastroparesis (06/2014)    Epidermal cyst of neck 03/25/2017   Excised by derm Diona Browner)   HCAP (healthcare-associated pneumonia) 06/2014   History of chicken pox    HLD (hyperlipidemia)    Hx of migraines    remote   Hypertension    Lobar pneumonia ([name]) 03/02/2022   Maxillary fracture ([name])    Mitral regurgitation    Multiple allergies    mold, wool, dust, feathers   NSVT (nonsustained ventricular tachycardia) ([name])    Orthostatic hypotension 07/2015   Pleural effusion, left    RBBB    S/P lens implant    left side ([name])   Tricuspid regurgitation    UTI (urinary tract infection) 06/2014   Vitiligo     Allergies Allergies  Allergen Reactions   Bee Venom Anaphylaxis, Swelling and Other (See Comments)    Throat swelling   Mushroom Extract Complex Anaphylaxis   Penicillins Anaphylaxis, Hives and Swelling    Tolerates cephalosporins including cephalexin multiple times.  Has patient had a PCN reaction causing immediate rash, facial/tongue/throat swelling, SOB or lightheadedness with hypotension: Yes Has patient had a PCN reaction causing severe rash involving mucus membranes or skin necrosis: Yes Has patient had a PCN reaction that required hospitalization Yes Has patient had a PCN reaction occurring within the last 10 years: No     Codeine Nausea And Vomiting   Sulfa Antibiotics Nausea And Vomiting   Iohexol Itching and Swelling   Erythromycin Base Rash    IV Location/Drains/Wounds Patient Lines/Drains/Airways Status     Active Line/Drains/Airways     Name Placement date Placement time Site Days   Peripheral IV 12/09/22 20 G Right Antecubital 12/09/22  1616  Antecubital  less than 1   Incision - 4 Ports Abdomen 1: Umbilicus 2: Mid;Upper 3: Right;Medial 4: Right;Lateral 07/07/19  1410  -- 1251   Wound / Incision (Open or Dehisced)  08/03/20 Non-pressure wound Buttocks Left 08/03/20  0730  Buttocks  858            Labs/Imaging Results for orders placed or performed during the hospital encounter of 12/09/22 (from the past 48 hour(s))  CBC with Differential     Status: Abnormal   Collection Time: 12/09/22  4:18 PM  Result Value Ref Range   WBC 6.9 4.0 - 10.5 K/uL   RBC 3.68 (L) 3.87 - 5.11 MIL/uL   Hemoglobin 11.0 (L) 12.0 - 15.0 g/dL   HCT 40.9 (L) 81.1 - 91.4 %   MCV 93.2 80.0 - 100.0 fL   MCH 29.9 26.0 - 34.0 pg   MCHC 32.1 30.0 - 36.0 g/dL   RDW 78.2 95.6 - 21.3 %  Platelets 210 150 - 400 K/uL   nRBC 0.0 0.0 - 0.2 %   Neutrophils Relative % 60 %   Neutro Abs 4.2 1.7 - 7.7 K/uL   Lymphocytes Relative 27 %   Lymphs Abs 1.9 0.7 - 4.0 K/uL   Monocytes Relative 7 %   Monocytes Absolute 0.5 0.1 - 1.0 K/uL   Eosinophils Relative 5 %   Eosinophils Absolute 0.3 0.0 - 0.5 K/uL   Basophils Relative 1 %   Basophils Absolute 0.1 0.0 - 0.1 K/uL   Immature Granulocytes 0 %   Abs Immature Granulocytes 0.03 0.00 - 0.07 K/uL    Comment: Performed at [facility], [address] [place], [place] [address]  Comprehensive metabolic panel     Status: Abnormal   Collection Time: 12/09/22  4:18 PM  Result Value Ref Range   Sodium 138 135 - 145 mmol/L   Potassium 4.3 3.5 - 5.1 mmol/L   Chloride 108 98 - 111 mmol/L   CO2 22 22 - 32 mmol/L   Glucose, Bld 149 (H) 70 - 99 mg/dL    Comment: Glucose reference range applies only to samples taken after fasting for at least 8 hours.   BUN 15 8 - 23 mg/dL   Creatinine, Ser 6.04 (H) 0.44 - 1.00 mg/dL   Calcium 9.0 8.9 - 54.0 mg/dL   Total Protein 7.0 6.5 - 8.1 g/dL   Albumin 3.6 3.5 - 5.0 g/dL   AST 14 (L) 15 - 41 U/L   ALT 11 0 - 44 U/L   Alkaline Phosphatase 66 38 - 126 U/L   Total Bilirubin 0.7 0.3 - 1.2 mg/dL   GFR, Estimated 43 (L) >60 mL/min    Comment: (NOTE) Calculated using the CKD-EPI Creatinine Equation (2021)    Anion gap 8 5 - 15     Comment: Performed at [facility], [address] [place], [place] [address]  Lipase, blood     Status: None   Collection Time: 12/09/22  4:18 PM  Result Value Ref Range   Lipase 29 11 - 51 U/L    Comment: Performed at [facility], [address], [place], [place] [address]  Urinalysis, Routine w reflex microscopic -Urine, Clean Catch     Status: Abnormal   Collection Time: 12/09/22  7:18 PM  Result Value Ref Range   Color, Urine COLORLESS (A) YELLOW   APPearance CLEAR CLEAR   Specific Gravity, Urine 1.004 (L) 1.005 - 1.030   pH 6.0 5.0 - 8.0   Glucose, UA NEGATIVE NEGATIVE mg/dL   Hgb urine dipstick NEGATIVE NEGATIVE   Bilirubin Urine NEGATIVE NEGATIVE   Ketones, ur NEGATIVE NEGATIVE mg/dL   Protein, ur NEGATIVE NEGATIVE mg/dL   Nitrite NEGATIVE NEGATIVE   Leukocytes,Ua MODERATE (A) NEGATIVE   RBC / HPF 0-5 0 - 5 RBC/hpf   WBC, UA 11-20 0 - 5 WBC/hpf   Bacteria, UA NONE SEEN NONE SEEN   Squamous Epithelial / HPF 0-5 0 - 5 /HPF    Comment: Performed at [facility], [address] [place], [place] [address]   No results found.  Pending Labs Unresulted Labs (From admission, onward)     Start     Ordered   12/10/22 0500  Comprehensive metabolic panel  Daily,   R      12/09/22 2113   12/10/22 0500  CBC  Daily,   R      12/09/22 2113   12/09/22  2042  Urine Culture (for pregnant, neutropenic or urologic patients or patients with an indwelling urinary catheter)  (Urine Labs)  Once,   URGENT       Question:  Indication  Answer:  Dysuria   12/09/22 2041            Vitals/Pain Today's Vitals   12/09/22 1630 12/09/22 1730 12/09/22 1809 12/09/22 1900  BP: (!) 155/86 (!) 178/90  (!) 161/87  Pulse: 77 79  79  Resp: 17 17  17   Temp:   98.6 F (37 C)   TempSrc:   Oral   SpO2: 100% 100%  100%  Weight:      Height:      PainSc:        Isolation Precautions No active isolations  Medications Medications   cefTRIAXone (ROCEPHIN) 1 g in sodium chloride 0.9 % 100 mL IVPB (has no administration in time range)  sodium chloride flush (NS) 0.9 % injection 3 mL (has no administration in time range)  sodium chloride flush (NS) 0.9 % injection 3 mL (has no administration in time range)  sodium chloride flush (NS) 0.9 % injection 3 mL (has no administration in time range)  0.9 %  sodium chloride infusion (has no administration in time range)  acetaminophen (TYLENOL) tablet 650 mg (has no administration in time range)    Or  acetaminophen (TYLENOL) suppository 650 mg (has no administration in time range)  cefTRIAXone (ROCEPHIN) 1 g in sodium chloride 0.9 % 100 mL IVPB (has no administration in time range)  ondansetron (ZOFRAN) tablet 4 mg (has no administration in time range)    Or  ondansetron (ZOFRAN) injection 4 mg (has no administration in time range)  atorvastatin (LIPITOR) tablet 40 mg (has no administration in time range)  isosorbide mononitrate (IMDUR) 24 hr tablet 15 mg (has no administration in time range)  metoprolol succinate (TOPROL-XL) 24 hr tablet 25 mg (has no administration in time range)  nitroGLYCERIN (NITROSTAT) SL tablet 0.4 mg (has no administration in time range)  DULoxetine (CYMBALTA) DR capsule 60 mg (has no administration in time range)  pantoprazole (PROTONIX) EC tablet 40 mg (has no administration in time range)  apixaban (ELIQUIS) tablet 5 mg (has no administration in time range)  clopidogrel (PLAVIX) tablet 75 mg (has no administration in time range)  gabapentin (NEURONTIN) capsule 300 mg (has no administration in time range)  insulin aspart (novoLOG) injection 0-6 Units (has no administration in time range)  insulin aspart (novoLOG) injection 0-5 Units (has no administration in time range)  hydrALAZINE (APRESOLINE) injection 5 mg (has no administration in time range)  albuterol (PROVENTIL) (2.5 MG/3ML) 0.083% nebulizer solution 2.5 mg (has no administration in time range)   sodium chloride 0.9 % bolus 500 mL (0 mLs Intravenous Stopped 12/09/22 2010)    Mobility walks with device
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
## 2022-12-10 DIAGNOSIS — H409 Unspecified glaucoma: Secondary | ICD-10-CM | POA: Diagnosis present

## 2022-12-10 DIAGNOSIS — N3000 Acute cystitis without hematuria: Secondary | ICD-10-CM | POA: Diagnosis not present

## 2022-12-10 LAB — CBC
HCT: 36.6 % (ref 36.0–46.0)
Hemoglobin: 12.1 g/dL (ref 12.0–15.0)
MCH: 30.4 pg (ref 26.0–34.0)
MCHC: 33.1 g/dL (ref 30.0–36.0)
MCV: 92 fL (ref 80.0–100.0)
Platelets: 232 10*3/uL (ref 150–400)
RBC: 3.98 MIL/uL (ref 3.87–5.11)
RDW: 13.1 % (ref 11.5–15.5)
WBC: 7.5 10*3/uL (ref 4.0–10.5)
nRBC: 0 % (ref 0.0–0.2)

## 2022-12-10 LAB — COMPREHENSIVE METABOLIC PANEL
ALT: 12 U/L (ref 0–44)
AST: 13 U/L — ABNORMAL LOW (ref 15–41)
Albumin: 3.6 g/dL (ref 3.5–5.0)
Alkaline Phosphatase: 65 U/L (ref 38–126)
Anion gap: 10 (ref 5–15)
BUN: 16 mg/dL (ref 8–23)
CO2: 22 mmol/L (ref 22–32)
Calcium: 9.1 mg/dL (ref 8.9–10.3)
Chloride: 103 mmol/L (ref 98–111)
Creatinine, Ser: 1.37 mg/dL — ABNORMAL HIGH (ref 0.44–1.00)
GFR, Estimated: 39 mL/min — ABNORMAL LOW (ref >60)
Glucose, Bld: 230 mg/dL — ABNORMAL HIGH (ref 70–99)
Potassium: 4.4 mmol/L (ref 3.5–5.1)
Sodium: 135 mmol/L (ref 135–145)
Total Bilirubin: 0.8 mg/dL (ref 0.3–1.2)
Total Protein: 7.1 g/dL (ref 6.5–8.1)

## 2022-12-10 LAB — GLUCOSE, CAPILLARY
Glucose-Capillary: 324 mg/dL — ABNORMAL HIGH (ref 70–99)
Glucose-Capillary: 334 mg/dL — ABNORMAL HIGH (ref 70–99)
Glucose-Capillary: 259 mg/dL — ABNORMAL HIGH (ref 70–99)
Glucose-Capillary: 129 mg/dL — ABNORMAL HIGH (ref 70–99)

## 2022-12-10 MED ORDER — ORAL CARE MOUTH RINSE: 15.0000 mL | OROMUCOSAL | Status: DC | PRN

## 2022-12-10 MED ORDER — DORZOLAMIDE HCL-TIMOLOL MAL 2-0.5 % OP SOLN
1.0000 [drp] | Freq: Two times a day (BID) | OPHTHALMIC | Status: DC
  Administered 2022-12-10 – 2022-12-12 (×6): 1 [drp] via OPHTHALMIC
  Filled 2022-12-10: qty 10

## 2022-12-10 MED ORDER — LACTATED RINGERS IV SOLN: INTRAVENOUS | Status: DC

## 2022-12-10 MED ORDER — LATANOPROST 0.005 % OP SOLN
1.0000 [drp] | Freq: Every day | OPHTHALMIC | Status: DC
  Administered 2022-12-10 – 2022-12-12 (×3): 1 [drp] via OPHTHALMIC
  Filled 2022-12-10: qty 2.5

## 2022-12-10 MED ORDER — DULOXETINE HCL 30 MG PO CPEP
60.0000 mg | ORAL_CAPSULE | Freq: Every day | ORAL | Status: DC
  Administered 2022-12-11 – 2022-12-12 (×2): 60 mg via ORAL
  Filled 2022-12-10 (×2): qty 2

## 2022-12-10 MED ORDER — OXYCODONE HCL 5 MG PO TABS
5.0000 mg | ORAL_TABLET | Freq: Once | ORAL | Status: AC
  Administered 2022-12-10: 5 mg via ORAL
  Filled 2022-12-10: qty 1

## 2022-12-10 NOTE — TOC CM/SW Note (Signed)
Transition of Care [facility]) - Inpatient Brief Assessment   Patient Details  Name: Maria Fox MRN: [id number] Date of Birth: [birth year]  Transition of Care [facility]) CM/SW Contact:    Otelia Santee, LCSW Phone Number: 12/10/2022, 1:48 PM   Clinical Narrative: Met with pt to discuss recommendation for HHPT. Pt declines having HH arranged. CSW offered to arrange OPPT for pt which, pt also declined. Pt denies DME needs. CSW encouraged pt to reach out should she change her mind. Moon given.    Transition of Care Asessment: Insurance and Status: Insurance coverage has been reviewed Patient has primary care physician: Yes Home environment has been reviewed: Home Prior level of function:: Independent/Modified independent Prior/Current Home Services: No current home services Social Determinants of Health Reivew: SDOH reviewed no interventions necessary Readmission risk has been reviewed: Yes Transition of care needs: transition of care needs identified, TOC will continue to follow

## 2022-12-10 NOTE — Plan of Care (Signed)
  Problem: Education: Goal: Ability to describe self-care measures that may prevent or decrease complications (Diabetes Survival Skills Education) will improve Outcome: Progressing   Problem: Education: Goal: Knowledge of General Education information will improve Description: Including pain rating scale, medication(s)/side effects and non-pharmacologic comfort measures Outcome: Progressing   Problem: Activity: Goal: Risk for activity intolerance will decrease Outcome: Progressing   Problem: Pain Managment: Goal: General experience of comfort will improve Outcome: Progressing   Problem: Safety: Goal: Ability to remain free from injury will improve Outcome: Progressing   Problem: Skin Integrity: Goal: Risk for impaired skin integrity will decrease Outcome: Progressing   

## 2022-12-10 NOTE — Hospital Course (Signed)
77yo with h/o CAD s/p CABG, aflutter with 2:1 block, orthostasis due to autonomia, stage 3a CKD, chronic diastolic CHF, DM, and HLD who presented on 9/30 with urinary retention.  Prior Klebsiella UTI in 08/2022.  She was diagnosed with acute cystitis and started on Ceftriaxone.

## 2022-12-10 NOTE — Inpatient Diabetes Management (Signed)
Inpatient Diabetes Program Recommendations  AACE/ADA: New Consensus Statement on Inpatient Glycemic Control (2015)  Target Ranges:  Prepandial:   less than 140 mg/dL      Peak postprandial:   less than 180 mg/dL (1-2 hours)      Critically ill patients:  140 - 180 mg/dL   Lab Results  Component Value Date   GLUCAP 324 (H) 12/10/2022   HGBA1C 8.0 (A) 08/20/2022    Review of Glycemic Control  Latest Reference Range & Units 12/09/22 22:24 12/10/22 08:13  Glucose-Capillary 70 - 99 mg/dL 914 (H) 782 (H)  (H): Data is abnormally high  Diabetes history: DM2 Outpatient Diabetes medications:  Glipizide 10 mg BID Metformin XR 500 mg BID Current orders for Inpatient glycemic control:  Novolog 0-6 units TID and 0-5 units QHS  Inpatient Diabetes Program Recommendations:    Might consider small dose of basal insulin while inpatient:  Semglee 6 units every day  Will continue to follow while inpatient.  Thank you, Dulce Sellar, MSN, CDCES Diabetes Coordinator Inpatient Diabetes Program [phone] (team pager from 8a-5p)

## 2022-12-10 NOTE — Evaluation (Signed)
Physical Therapy Evaluation Patient Details Name: Maria Fox MRN: [id number] DOB: 11-07-1945 Today's Date: 12/10/2022  History of Present Illness  Maria Fox is a 77 y.o. female presents with increased urinary frequency and urgency. PMH: ventricular tachycardia, CAD s/p CABG three-vessel 2016, atrial flutter 2 is to 1 heart block, orthostatic hypotension with autonomic dysfunction, CKD stage 3A, diastolic CHF with preserved EF 60-65%, diabetes, anemia of chronic disease, GERD and hyperlipidemia  Clinical Impression  Pt admitted with above diagnosis. Pt from home with friend [name], reports she is ind with self care and amb without AD, does have RW and SPC but doesn't use them, [name] completes household chores. Pt completes STS transfers with RW needing supv and HHA or no AD with CGA, educated pt on safety with DME and functional mobility. Pt amb to restroom with HHA, declines RW use, able to void bowel and bladder and complete pericare without physical assistance. Pt then amb in hallway with HHA, continues to decline RW, needing CGA without overt LOB, narrow BOS with decreased cadence. Recommend HHPT at discharge and pt in agreement. Pt currently with functional limitations due to the deficits listed below (see PT Problem List). Pt will benefit from acute skilled PT to increase their independence and safety with mobility to allow discharge.           If plan is discharge home, recommend the following: A little help with walking and/or transfers;Assistance with cooking/housework;Assist for transportation;Help with stairs or ramp for entrance   Can travel by private vehicle        Equipment Recommendations None recommended by PT  Recommendations for Other Services       Functional Status Assessment Patient has had a recent decline in their functional status and demonstrates the ability to make significant improvements in function in a reasonable and predictable amount of time.      Precautions / Restrictions Precautions Precautions: Fall Restrictions Weight Bearing Restrictions: No      Mobility  Bed Mobility Overal bed mobility: Needs Assistance Bed Mobility: Supine to Sit, Sit to Supine     Supine to sit: Min assist, HOB elevated Sit to supine: Modified independent (Device/Increase time)   General bed mobility comments: pt declines bedrails, requests to pull from therapist's hands to upright trunk into sitting, mod Ind to return to supine    Transfers Overall transfer level: Needs assistance Equipment used: 1 person hand held assist, Rolling walker (2 wheels), None Transfers: Sit to/from Stand Sit to Stand: Contact guard assist, Supervision           General transfer comment: CGA to SUPV with transfers, educated on hand placement and DME use, pt supv with RW but prefers HHA to no AD with CGA to steady as pt reaching for walls and furniture with initial rise to stand    Ambulation/Gait Ambulation/Gait assistance: Contact guard assist Gait Distance (Feet): 120 Feet Assistive device: 1 person hand held assist Gait Pattern/deviations: Step-through pattern, Decreased stride length, Narrow base of support Gait velocity: decreased     General Gait Details: slow, step through gait pattern with narrow BOS, declines RW use despite education on safety and pt having one at home, prefers HHA with therapist, no overt LOB  Stairs            Wheelchair Mobility     Tilt Bed    Modified Rankin (Stroke Patients Only)       Balance Overall balance assessment: Mild deficits observed, not formally tested  Pertinent Vitals/Pain Pain Assessment Pain Assessment: Faces Faces Pain Scale: Hurts little more Pain Location: "headache" Pain Descriptors / Indicators: Discomfort Pain Intervention(s): Limited activity within patient's tolerance, Monitored during session, Premedicated before  session, Repositioned    Home Living Family/patient expects to be discharged to:: Private residence Living Arrangements: Other (Comment) [name] a friend of mine") Available Help at Discharge: Family Type of Home: House Home Access: Stairs to enter Entrance Stairs-Rails: Doctor, general practice of Steps: 2   Home Layout: One level Home Equipment: Agricultural consultant (2 wheels);Cane - single point;Shower seat      Prior Function Prior Level of Function : Independent/Modified Independent             Mobility Comments: pt reports ind with household and community amb, has SPC or RW but doesn't use them, reports 1 fall in the yard ~3 months ago and was able to get up on her own ADLs Comments: pt reports ind with self care, [name] does the cooking and cleaning     Extremity/Trunk Assessment   Upper Extremity Assessment Upper Extremity Assessment: Defer to OT evaluation    Lower Extremity Assessment Lower Extremity Assessment: Generalized weakness (AROM WFL, strength grossly 3+/5, denies numbness/tingling throughout)    Cervical / Trunk Assessment Cervical / Trunk Assessment: Normal  Communication   Communication Communication: No apparent difficulties  Cognition Arousal: Alert Behavior During Therapy: WFL for tasks assessed/performed Overall Cognitive Status: Within Functional Limits for tasks assessed                                          General Comments General comments (skin integrity, edema, etc.): HR max noted 97    Exercises     Assessment/Plan    PT Assessment Patient needs continued PT services  PT Problem List Decreased strength;Decreased activity tolerance;Decreased balance;Decreased mobility;Decreased knowledge of use of DME;Decreased safety awareness;Pain       PT Treatment Interventions DME instruction;Gait training;Stair training;Functional mobility training;Therapeutic activities;Therapeutic exercise;Balance  training;Patient/family education    PT Goals (Current goals can be found in the Care Plan section)  Acute Rehab PT Goals Patient Stated Goal: feel better PT Goal Formulation: With patient Time For Goal Achievement: 12/24/22 Potential to Achieve Goals: Good    Frequency Min 1X/week     Co-evaluation               AM-PAC PT "6 Clicks" Mobility  Outcome Measure Help needed turning from your back to your side while in a flat bed without using bedrails?: A Little Help needed moving from lying on your back to sitting on the side of a flat bed without using bedrails?: A Little Help needed moving to and from a bed to a chair (including a wheelchair)?: A Little Help needed standing up from a chair using your arms (e.g., wheelchair or bedside chair)?: A Little Help needed to walk in hospital room?: A Little Help needed climbing 3-5 steps with a railing? : A Little 6 Click Score: 18    End of Session Equipment Utilized During Treatment: Gait belt Activity Tolerance: Patient tolerated treatment well Patient left: in bed;with call bell/phone within reach;with bed alarm set Nurse Communication: Mobility status;Other (comment) (voided bowel and bladder, urine in toilet hat) PT Visit Diagnosis: Other abnormalities of gait and mobility (R26.89);Muscle weakness (generalized) (M62.81)    Time: 8119-1478 PT Time Calculation (min) (ACUTE ONLY): 28 min  Charges:   PT Evaluation $PT Eval Low Complexity: 1 Low PT Treatments $Gait Training: 8-22 mins PT General Charges $$ ACUTE PT VISIT: 1 Visit         Tori Mileydi Milsap PT, DPT 12/10/22, 10:39 AM

## 2022-12-10 NOTE — Progress Notes (Signed)
Progress Note   Patient: Maria Fox ZOX:[id number] DOB: 1945/08/08 DOA: 12/09/2022     0 DOS: the patient was seen and examined on 12/10/2022   Brief hospital course: 77yo with h/o CAD s/p CABG, aflutter with 2:1 block, orthostasis due to autonomia, stage 3a CKD, chronic diastolic CHF, DM, and HLD who presented on 9/30 with urinary retention.  Prior Klebsiella UTI in 08/2022.  She was diagnosed with acute cystitis and started on Ceftriaxone.    Assessment and Plan:  Acute cystitis -Patient presenting without sepsis physiology but with symptoms concerning for recurrent UTI -Prior UTI in 02/2022 with >100k colonies of Klebsiella and 60k colonies of E coli - resistant to ampicillin and nitrofurantoin -Presenting UA with moderate LE but otherwise fairly unremarkable; clinically she has a UTI so will continue treatment while pending urine culture -No concern for sepsis -Will continue treatment with Rocephin (received first dose in the ER) -Will continue to observe overnight, anticipate discharge to home 10/2 unless new issues arise  -She does report some urinary retention so will ask RN to perform and record bladder scan  CAD, atrial flutter -s/p CABG -Echo in 02/2022 with preserved EF  -Unable to tolerate ACE/ARB due to orthostasis -Continue Toprol XL, Imdur -Continue Eliquis and Plavix  HTN with Orthostatic hypotension -Continue Toprol XL  Non-insulin independent DM type II -Last A1c was 8 in 08/2022, suboptimal control -Hold glipizide and metformin -Will cover with moderate-scale SSI -Continue gabapentin   CKD stage 3A -Appears to be stable at this time -Attempt to avoid nephrotoxic medications -Recheck BMP in AM    Anaemia of chronic disease due to CKD -Stable H&H 11 and 34.3.   -Continue to monitor CBC   Hyperlipidemia -Continue Lipitor   Generalized anxiety disorder -Continue Cymbalta    History of GERD -Continue Protonix 40 mg twice daily   Generalized  weakness -Patient presenting with generalized weakness secondary to in the setting of acute cystitis. -Continue fall precaution -Due to advanced age and history of orthostatic hypotension obtaining inpatient PT and OT for balance evaluation and requirement for DME.  Glaucoma -Continue Cosopt, latanoprost    Consultants: PT/OT  Procedures: None  Antibiotics: Ceftriaxone 9/30-      Subjective: She is feeling some better today but continues to have dysuria, some abdominal pain.     Objective: Vitals:   12/10/22 0613 12/10/22 1123  BP: (!) 152/82 115/60  Pulse: 91 80  Resp: 19 17  Temp: 98 F (36.7 C) 97.7 F (36.5 C)  SpO2: 97% 95%    Intake/Output Summary (Last 24 hours) at 12/10/2022 1438 Last data filed at 12/10/2022 1100 Gross per 24 hour  Intake 1460 ml  Output --  Net 1460 ml   Filed Weights   12/09/22 1408  Weight: 60.3 kg    Exam:  General:  Appears calm and comfortable and is in NAD Eyes:  EOMI, normal lids, iris ENT:  grossly normal hearing, lips & tongue, mmm Neck:  no LAD, masses or thyromegaly Cardiovascular:  RRR, no m/r/g. No LE edema.  Respiratory:   CTA bilaterally with no wheezes/rales/rhonchi.  Normal respiratory effort. Abdomen:  soft, mild suprapubic TTP, ND Skin:  no rash or induration seen on limited exam Musculoskeletal:  grossly normal tone BUE/BLE, good ROM, no bony abnormality Psychiatric:  blunted mood and affect, speech fluent and appropriate, AOx3 Neurologic:  CN 2-12 grossly intact, moves all extremities in coordinated fashiont  Data Reviewed: I have reviewed the patient's lab results since admission.  Pertinent labs for today include:   Glucose 230 BUN 16/Creatinine 1.37/GFR 39, stable Normal CBC     Family Communication: None present  Disposition: Status is: Observation The patient remains OBS appropriate and will d/c before 2 midnights.     Time spent: 50 minutes  Unresulted Labs (From admission, onward)      Start     Ordered   12/10/22 0500  Comprehensive metabolic panel  Daily,   R      12/09/22 2113   12/10/22 0500  CBC  Daily,   R      12/09/22 2113   12/09/22 2042  Urine Culture (for pregnant, neutropenic or urologic patients or patients with an indwelling urinary catheter)  (Urine Labs)  Once,   URGENT       Question:  Indication  Answer:  Dysuria   12/09/22 2041             Author: Jonah Blue, MD 12/10/2022 2:38 PM  For on call review [email or web address].

## 2022-12-10 NOTE — Evaluation (Signed)
Occupational Therapy Evaluation Patient Details Name: Maria Fox MRN: [id number] DOB: 06/21/1945 Today's Date: 12/10/2022   History of Present Illness Maria Fox is a 77 y.o. female presents with increased urinary frequency and urgency. Pt found to have UTI. PMH: ventricular tachycardia, CAD s/p CABG three-vessel 2016, atrial flutter 2 is to 1 heart block, orthostatic hypotension with autonomic dysfunction, CKD stage 3A, diastolic CHF with preserved EF 60-65%, diabetes, anemia of chronic disease, GERD and hyperlipidemia   Clinical Impression   Pt admitted with the above diagnosis and has the deficits outlined below. Pt would benefit from cont OT increase balance and efficiency with all basic adls before she returns home with significant other, [name]. Pt was mod I prior to admission and overall is at contact guard to supervision level with all adls. Pt slightly unsteady when walking to bathroom but does not want to use cane or walker but has both at home.  Spoke to pt about fall prevention and safety in regards to throw rugs at home, bathing when [name] is home and keeping phone on her at all times in case of falls. Will continue to see acutely but should not need post acute OT.       If plan is discharge home, recommend the following: A little help with walking and/or transfers;A little help with bathing/dressing/bathroom;Assistance with cooking/housework;Assist for transportation;Direct supervision/assist for medications management    Functional Status Assessment  Patient has had a recent decline in their functional status and demonstrates the ability to make significant improvements in function in a reasonable and predictable amount of time.  Equipment Recommendations  None recommended by OT    Recommendations for Other Services       Precautions / Restrictions Precautions Precautions: Fall Precaution Comments: slighly off balance when walking. Restrictions Weight Bearing  Restrictions: No      Mobility Bed Mobility Overal bed mobility: Needs Assistance Bed Mobility: Supine to Sit, Sit to Supine     Supine to sit: Supervision, HOB elevated Sit to supine: Modified independent (Device/Increase time)   General bed mobility comments: Pt did not require physical assist to get into or out of bed. HOB was at 30 degrees    Transfers Overall transfer level: Needs assistance Equipment used: 1 person hand held assist, Rolling walker (2 wheels), None Transfers: Sit to/from Stand, Bed to chair/wheelchair/BSC Sit to Stand: Contact guard assist, Supervision     Step pivot transfers: Contact guard assist     General transfer comment: Pt was slightly unsteady with mobilty but states she holds to furniture at home therefore does not want to use a walker or cane.  [name] is there at all times to assist as needed.      Balance Overall balance assessment: Mild deficits observed, not formally tested                                         ADL either performed or assessed with clinical judgement   ADL Overall ADL's : Needs assistance/impaired Eating/Feeding: Independent;Sitting   Grooming: Wash/dry hands;Wash/dry face;Oral care;Applying deodorant;Brushing hair;Supervision/safety;Standing Grooming Details (indicate cue type and reason): Pt stood at sink to groom. Upper Body Bathing: Set up;Sitting   Lower Body Bathing: Supervison/ safety;Sit to/from stand   Upper Body Dressing : Set up;Sitting   Lower Body Dressing: Supervision/safety;Sit to/from stand   Toilet Transfer: Contact guard assist;Ambulation;Comfort height toilet;Grab bars Statistician Details (  indicate cue type and reason): Pt walked to bathroom with CG assist but completed all toieting tasks without physical assist. Toileting- Clothing Manipulation and Hygiene: Supervision/safety;Sit to/from stand       Functional mobility during ADLs: Contact guard assist General ADL  Comments: Pt slightly unsteady on feet when walking to the bathroom but no overt LOB. Feel pt would be safer with cane or walker but does not want to use one.     Vision Baseline Vision/History: 0 No visual deficits Ability to See in Adequate Light: 0 Adequate Patient Visual Report: No change from baseline Vision Assessment?: No apparent visual deficits     Perception Perception: Within Functional Limits       Praxis Praxis: WFL       Pertinent Vitals/Pain Pain Assessment Pain Assessment: No/denies pain     Extremity/Trunk Assessment Upper Extremity Assessment Upper Extremity Assessment: Overall WFL for tasks assessed   Lower Extremity Assessment Lower Extremity Assessment: Defer to PT evaluation   Cervical / Trunk Assessment Cervical / Trunk Assessment: Normal   Communication Communication Communication: No apparent difficulties   Cognition Arousal: Alert Behavior During Therapy: WFL for tasks assessed/performed Overall Cognitive Status: Within Functional Limits for tasks assessed                                 General Comments: Pt oriented to time, place, situation, answered safety questions, recalled 911 number. It did take pt a few minutes to figure out how to tell what time it was.  With moderate cues, pt was able to read clock.     General Comments  Pt should be safe for dc if [name] is home with her.    Exercises     Shoulder Instructions      Home Living Family/patient expects to be discharged to:: Private residence Living Arrangements: Other (Comment) Available Help at Discharge: Family;Available 24 hours/day Type of Home: House Home Access: Stairs to enter [employer] of Steps: 2 Entrance Stairs-Rails: Right;Left Home Layout: One level     Bathroom Shower/Tub: Chief Strategy Officer: Standard     Home Equipment: Agricultural consultant (2 wheels);Cane - single point;Tub bench          Prior  Functioning/Environment Prior Level of Function : Independent/Modified Independent             Mobility Comments: pt reports ind with household and community amb, has SPC or RW but doesn't use them, reports 1 fall in the yard ~3 months ago and was able to get up on her own ADLs Comments: pt reports ind with self care, [name] does the cooking and cleaning and driving        OT Problem List: Impaired balance (sitting and/or standing);Decreased knowledge of use of DME or AE      OT Treatment/Interventions: Self-care/ADL training;Balance training;DME and/or AE instruction    OT Goals(Current goals can be found in the care plan section) Acute Rehab OT Goals Patient Stated Goal: to get steady on my feet and go home OT Goal Formulation: With patient Time For Goal Achievement: 12/24/22 Potential to Achieve Goals: Good ADL Goals Pt Will Perform Grooming: with modified independence;standing Pt Will Perform Tub/Shower Transfer: with modified independence;tub bench;ambulating Additional ADL Goal #1: Pt will walk to bathroom and complete all toileting with mod I Additional ADL Goal #2: Pt will gather all clothing and dress self with mod I  OT Frequency: Min  1X/week    Co-evaluation              AM-PAC OT "6 Clicks" Daily Activity     Outcome Measure Help from another person eating meals?: None Help from another person taking care of personal grooming?: A Little Help from another person toileting, which includes using toliet, bedpan, or urinal?: A Little Help from another person bathing (including washing, rinsing, drying)?: A Little Help from another person to put on and taking off regular upper body clothing?: A Little Help from another person to put on and taking off regular lower body clothing?: A Little 6 Click Score: 19   End of Session Nurse Communication: Mobility status  Activity Tolerance: Patient tolerated treatment well Patient left: in bed;with call bell/phone within  reach;with bed alarm set  OT Visit Diagnosis: Unsteadiness on feet (R26.81)                Time: 4132-4401 OT Time Calculation (min): 13 min Charges:  OT General Charges $OT Visit: 1 Visit OT Evaluation $OT Eval Low Complexity: 1 Low  Hope Budds 12/10/2022, 12:03 PM

## 2022-12-11 DIAGNOSIS — L600 Ingrowing nail: Secondary | ICD-10-CM | POA: Diagnosis not present

## 2022-12-11 DIAGNOSIS — Z79899 Other long term (current) drug therapy: Secondary | ICD-10-CM | POA: Diagnosis not present

## 2022-12-11 DIAGNOSIS — R531 Weakness: Secondary | ICD-10-CM | POA: Diagnosis not present

## 2022-12-11 DIAGNOSIS — E785 Hyperlipidemia, unspecified: Secondary | ICD-10-CM | POA: Diagnosis present

## 2022-12-11 DIAGNOSIS — H409 Unspecified glaucoma: Secondary | ICD-10-CM | POA: Diagnosis present

## 2022-12-11 DIAGNOSIS — Z86718 Personal history of other venous thrombosis and embolism: Secondary | ICD-10-CM | POA: Diagnosis not present

## 2022-12-11 DIAGNOSIS — I5032 Chronic diastolic (congestive) heart failure: Secondary | ICD-10-CM | POA: Diagnosis present

## 2022-12-11 DIAGNOSIS — I2510 Atherosclerotic heart disease of native coronary artery without angina pectoris: Secondary | ICD-10-CM | POA: Diagnosis present

## 2022-12-11 DIAGNOSIS — I4892 Unspecified atrial flutter: Secondary | ICD-10-CM | POA: Diagnosis present

## 2022-12-11 DIAGNOSIS — N390 Urinary tract infection, site not specified: Secondary | ICD-10-CM

## 2022-12-11 DIAGNOSIS — N3000 Acute cystitis without hematuria: Secondary | ICD-10-CM | POA: Diagnosis present

## 2022-12-11 DIAGNOSIS — Z7901 Long term (current) use of anticoagulants: Secondary | ICD-10-CM | POA: Diagnosis not present

## 2022-12-11 DIAGNOSIS — N1832 Chronic kidney disease, stage 3b: Secondary | ICD-10-CM | POA: Diagnosis present

## 2022-12-11 DIAGNOSIS — Z951 Presence of aortocoronary bypass graft: Secondary | ICD-10-CM | POA: Diagnosis not present

## 2022-12-11 DIAGNOSIS — E1122 Type 2 diabetes mellitus with diabetic chronic kidney disease: Secondary | ICD-10-CM | POA: Diagnosis present

## 2022-12-11 DIAGNOSIS — N179 Acute kidney failure, unspecified: Secondary | ICD-10-CM | POA: Diagnosis not present

## 2022-12-11 DIAGNOSIS — J45909 Unspecified asthma, uncomplicated: Secondary | ICD-10-CM | POA: Diagnosis present

## 2022-12-11 DIAGNOSIS — Z8616 Personal history of COVID-19: Secondary | ICD-10-CM | POA: Diagnosis not present

## 2022-12-11 DIAGNOSIS — Z7984 Long term (current) use of oral hypoglycemic drugs: Secondary | ICD-10-CM | POA: Diagnosis not present

## 2022-12-11 DIAGNOSIS — F411 Generalized anxiety disorder: Secondary | ICD-10-CM | POA: Diagnosis present

## 2022-12-11 DIAGNOSIS — E1142 Type 2 diabetes mellitus with diabetic polyneuropathy: Secondary | ICD-10-CM | POA: Diagnosis present

## 2022-12-11 DIAGNOSIS — Z8249 Family history of ischemic heart disease and other diseases of the circulatory system: Secondary | ICD-10-CM | POA: Diagnosis not present

## 2022-12-11 DIAGNOSIS — I130 Hypertensive heart and chronic kidney disease with heart failure and stage 1 through stage 4 chronic kidney disease, or unspecified chronic kidney disease: Secondary | ICD-10-CM | POA: Diagnosis present

## 2022-12-11 DIAGNOSIS — K219 Gastro-esophageal reflux disease without esophagitis: Secondary | ICD-10-CM | POA: Diagnosis present

## 2022-12-11 DIAGNOSIS — I081 Rheumatic disorders of both mitral and tricuspid valves: Secondary | ICD-10-CM | POA: Diagnosis present

## 2022-12-11 DIAGNOSIS — D631 Anemia in chronic kidney disease: Secondary | ICD-10-CM | POA: Diagnosis present

## 2022-12-11 DIAGNOSIS — I951 Orthostatic hypotension: Secondary | ICD-10-CM | POA: Diagnosis present

## 2022-12-11 LAB — COMPREHENSIVE METABOLIC PANEL
ALT: 11 U/L (ref 0–44)
AST: 13 U/L — ABNORMAL LOW (ref 15–41)
Albumin: 3.2 g/dL — ABNORMAL LOW (ref 3.5–5.0)
Alkaline Phosphatase: 62 U/L (ref 38–126)
Anion gap: 10 (ref 5–15)
BUN: 20 mg/dL (ref 8–23)
CO2: 23 mmol/L (ref 22–32)
Calcium: 8.8 mg/dL — ABNORMAL LOW (ref 8.9–10.3)
Chloride: 102 mmol/L (ref 98–111)
Creatinine, Ser: 1.47 mg/dL — ABNORMAL HIGH (ref 0.44–1.00)
GFR, Estimated: 36 mL/min — ABNORMAL LOW (ref >60)
Glucose, Bld: 244 mg/dL — ABNORMAL HIGH (ref 70–99)
Potassium: 4.5 mmol/L (ref 3.5–5.1)
Sodium: 135 mmol/L (ref 135–145)
Total Bilirubin: 0.2 mg/dL — ABNORMAL LOW (ref 0.3–1.2)
Total Protein: 6.1 g/dL — ABNORMAL LOW (ref 6.5–8.1)

## 2022-12-11 LAB — GLUCOSE, CAPILLARY
Glucose-Capillary: 262 mg/dL — ABNORMAL HIGH (ref 70–99)
Glucose-Capillary: 474 mg/dL — ABNORMAL HIGH (ref 70–99)
Glucose-Capillary: 432 mg/dL — ABNORMAL HIGH (ref 70–99)
Glucose-Capillary: 139 mg/dL — ABNORMAL HIGH (ref 70–99)
Glucose-Capillary: 225 mg/dL — ABNORMAL HIGH (ref 70–99)

## 2022-12-11 LAB — CBC
HCT: 32.1 % — ABNORMAL LOW (ref 36.0–46.0)
Hemoglobin: 10.4 g/dL — ABNORMAL LOW (ref 12.0–15.0)
MCH: 30.2 pg (ref 26.0–34.0)
MCHC: 32.4 g/dL (ref 30.0–36.0)
MCV: 93.3 fL (ref 80.0–100.0)
Platelets: 182 10*3/uL (ref 150–400)
RBC: 3.44 MIL/uL — ABNORMAL LOW (ref 3.87–5.11)
RDW: 13.1 % (ref 11.5–15.5)
WBC: 5.5 10*3/uL (ref 4.0–10.5)
nRBC: 0 % (ref 0.0–0.2)

## 2022-12-11 MED ORDER — INSULIN ASPART 100 UNIT/ML IJ SOLN
10.0000 [IU] | Freq: Once | INTRAMUSCULAR | Status: AC
  Administered 2022-12-11: 10 [IU] via SUBCUTANEOUS

## 2022-12-11 MED ORDER — PHENAZOPYRIDINE HCL 100 MG PO TABS
100.0000 mg | ORAL_TABLET | Freq: Three times a day (TID) | ORAL | Status: DC
  Administered 2022-12-11: 100 mg via ORAL
  Filled 2022-12-11: qty 1

## 2022-12-11 MED ORDER — MELATONIN 5 MG PO TABS
5.0000 mg | ORAL_TABLET | Freq: Every evening | ORAL | Status: DC | PRN
  Administered 2022-12-11: 5 mg via ORAL
  Filled 2022-12-11: qty 1

## 2022-12-11 MED ORDER — PHENAZOPYRIDINE HCL 100 MG PO TABS
100.0000 mg | ORAL_TABLET | Freq: Every day | ORAL | Status: DC
  Administered 2022-12-12: 100 mg via ORAL
  Filled 2022-12-11: qty 1

## 2022-12-11 MED ORDER — INSULIN ASPART 100 UNIT/ML IJ SOLN
0.0000 [IU] | Freq: Every day | INTRAMUSCULAR | Status: DC
  Administered 2022-12-11 – 2022-12-12 (×2): 2 [IU] via SUBCUTANEOUS

## 2022-12-11 MED ORDER — INSULIN DETEMIR 100 UNIT/ML ~~LOC~~ SOLN
10.0000 [IU] | Freq: Every day | SUBCUTANEOUS | Status: DC
  Administered 2022-12-11 – 2022-12-12 (×2): 10 [IU] via SUBCUTANEOUS
  Filled 2022-12-11 (×2): qty 0.1

## 2022-12-11 MED ORDER — INSULIN ASPART 100 UNIT/ML IJ SOLN
0.0000 [IU] | Freq: Three times a day (TID) | INTRAMUSCULAR | Status: DC
  Administered 2022-12-11: 2 [IU] via SUBCUTANEOUS
  Administered 2022-12-12: 11 [IU] via SUBCUTANEOUS
  Administered 2022-12-12: 5 [IU] via SUBCUTANEOUS

## 2022-12-11 NOTE — Progress Notes (Signed)
Progress Note   Patient: Maria Fox:[id number] DOB: 1945/04/06 DOA: 12/09/2022     0 DOS: the patient was seen and examined on 12/11/2022   Brief hospital course: 77yo with h/o CAD s/p CABG, aflutter with 2:1 block, orthostasis due to autonomia, stage 3a CKD, chronic diastolic CHF, DM, and HLD who presented on 9/30 with urinary retention.  Prior Klebsiella UTI in 08/2022.  She was diagnosed with acute cystitis and started on Ceftriaxone.    Assessment and Plan: Acute cystitis -Patient presenting without sepsis physiology but with symptoms concerning for recurrent UTI -Prior UTI in 02/2022 with >100k colonies of Klebsiella and 60k colonies of E coli - resistant to ampicillin and nitrofurantoin -Presenting UA with moderate LE but otherwise fairly unremarkable; clinically she has a UTI so will continue treatment while pending urine culture -Continued with Rocephin  -Urine cx pending. Thus far, notable for klebsiella, pending sensitivities   CAD, atrial flutter -s/p CABG -Echo in 02/2022 with preserved EF  -Unable to tolerate ACE/ARB due to orthostasis -Continue Toprol XL, Imdur -Continue Eliquis and Plavix   HTN with Orthostatic hypotension -Continue Toprol XL   Non-insulin independent DM type II -Last A1c was 8 in 08/2022, suboptimal control -Hold glipizide and metformin -Will cover with moderate-scale SSI -Continue gabapentin -Very poor glycemic control today with glucose in the 400's -Add 10 units semglee   Acute on CKD stage 3A -Cr up from 1.27 to 1.47 today -Attempt to avoid nephrotoxic medications -Cont IVF hydration -Recheck bmet in AM   Anaemia of chronic disease due to CKD -Stable H&H 11 and 34.3.   -Continue to monitor CBC   Hyperlipidemia -Continue Lipitor   Generalized anxiety disorder -Continue Cymbalta    History of GERD -Continue Protonix 40 mg twice daily   Generalized weakness -Patient presenting with generalized weakness secondary to in the  setting of acute cystitis. -Continue fall precaution -Due to advanced age and history of orthostatic hypotension obtaining inpatient PT and OT for balance evaluation and requirement for DME.   Glaucoma -Continue Cosopt, latanoprost      Subjective: Reports not feeling very well this AM  Physical Exam: Vitals:   12/10/22 1123 12/10/22 1938 12/11/22 0512 12/11/22 1257  BP: 115/60 115/65 (!) 153/74 (!) 152/67  Pulse: 80 77 71 67  Resp: 17  16 18   Temp: 97.7 F (36.5 C) 98.2 F (36.8 C) 97.7 F (36.5 C)   TempSrc:  Oral Oral   SpO2: 95% 95% 97% 100%  Weight:      Height:       General exam: Awake, laying in bed, in nad Respiratory system: Normal respiratory effort, no wheezing Cardiovascular system: regular rate, s1, s2 Gastrointestinal system: Soft, nondistended, positive BS Central nervous system: CN2-12 grossly intact, strength intact Extremities: Perfused, no clubbing Skin: Normal skin turgor, no notable skin lesions seen Psychiatry: Mood normal // no visual hallucinations   Data Reviewed:  Labs reviewed: Na 135, K 4.5, Cr 1.47, WBC 5.5, Hgb 10.4, Plts 182  Family Communication: Pt in room, family not at bedside  Disposition: Status is: Observation The patient will require care spanning > 2 midnights and should be moved to inpatient because: severity of illness  Planned Discharge Destination: Home    Author: Rickey Barbara, MD 12/11/2022 4:54 PM  For on call review [email or web address].

## 2022-12-11 NOTE — Plan of Care (Signed)

## 2022-12-11 NOTE — Inpatient Diabetes Management (Signed)
Inpatient Diabetes Program Recommendations  AACE/ADA: New Consensus Statement on Inpatient Glycemic Control (2015)  Target Ranges:  Prepandial:   less than 140 mg/dL      Peak postprandial:   less than 180 mg/dL (1-2 hours)      Critically ill patients:  140 - 180 mg/dL   Lab Results  Component Value Date   GLUCAP 262 (H) 12/11/2022   HGBA1C 8.0 (A) 08/20/2022    Review of Glycemic Control  Latest Reference Range & Units 12/10/22 08:13 12/10/22 11:56 12/10/22 16:42 12/10/22 21:45 12/11/22 07:32  Glucose-Capillary 70 - 99 mg/dL 829 (H) 562 (H) 130 (H) 129 (H) 262 (H)  (H): Data is abnormally high  Diabetes history: DM2 Outpatient Diabetes medications:  Glipizide 10 mg BID Metformin XR 500 mg BID Current orders for Inpatient glycemic control:  Novolog 0-6 units TID and 0-5 units QHS   Inpatient Diabetes Program Recommendations:     Might consider small dose of basal insulin while inpatient:   Semglee 6 units every day   Will continue to follow while inpatient.   Thank you, Dulce Sellar, MSN, CDCES Diabetes Coordinator Inpatient Diabetes Program [phone] (team pager from 8a-5p)

## 2022-12-11 NOTE — Consult Note (Addendum)
PODIATRY CONSULTATION  NAME Maria Fox MRN [id number] DOB 11-Oct-1945 DOA 12/09/2022   Reason for consult: Painful toenail left hallux Chief Complaint  Patient presents with   Urinary Retention    Consulting physician: Jonah Blue, MD, [facility]  History of present illness: 77 y.o. female admitted for UTI.  Upon admission the patient complained of a symptomatic painful loosely adhered toenail to the left hallux nail plate.  Denies a history of injury.  She says that the toenail is very tender and painful and gets caught on her socks or bed sheets.  She requested to have podiatry see her for possible nail avulsion.  Podiatry consulted  Past Medical History:  Diagnosis Date   Acute cholecystitis 07/07/2019   Acute deep vein thrombosis (DVT) of left tibial vein ([name]) 07/11/2019   Acute left PCA stroke ([name]) 07/13/2015   Angioedema    Atrial flutter ([name])    Autoimmune deficiency syndrome ([name])    CAD (coronary artery disease), native coronary artery 06/2014   Chronic diastolic CHF (congestive heart failure) ([name]) 08/27/2014   Chronic kidney disease, stage 3b ([name])    Closed fracture of maxilla ([name])    COVID-19 virus infection 03/2020   CVA (cerebral infarction) 07/2014   bilateral corona radiata - periCABG   Dermatitis    eval [name] 2011: eczema, eval [name] 2011: bx negative for lichen simplex or derm herpetiformis   DM (diabetes mellitus), type 2, uncontrolled w/neurologic complication 06/02/2012   ?autonomic neuropathy, gastroparesis (06/2014)    Epidermal cyst of neck 03/25/2017   Excised by derm Diona Browner)   HCAP (healthcare-associated pneumonia) 06/2014   History of chicken pox    HLD (hyperlipidemia)    Hx of migraines    remote   Hypertension    Lobar pneumonia ([name]) 03/02/2022   Maxillary fracture ([name])    Mitral regurgitation    Multiple allergies    mold, wool, dust, feathers   NSVT (nonsustained ventricular tachycardia) ([name])    Orthostatic  hypotension 07/2015   Pleural effusion, left    RBBB    S/P lens implant    left side ([name])   Tricuspid regurgitation    UTI (urinary tract infection) 06/2014   Vitiligo        Latest Ref Rng & Units 12/11/2022    5:16 AM 12/10/2022    5:53 AM 12/09/2022    4:18 PM  CBC  WBC 4.0 - 10.5 K/uL 5.5  7.5  6.9   Hemoglobin 12.0 - 15.0 g/dL 40.9  81.1  91.4   Hematocrit 36.0 - 46.0 % 32.1  36.6  34.3   Platelets 150 - 400 K/uL 182  232  210        Latest Ref Rng & Units 12/11/2022    5:16 AM 12/10/2022    5:53 AM 12/09/2022    4:18 PM  BMP  Glucose 70 - 99 mg/dL 782  956  213   BUN 8 - 23 mg/dL 20  16  15    Creatinine 0.44 - 1.00 mg/dL 0.86  5.78  4.69   Sodium 135 - 145 mmol/L 135  135  138   Potassium 3.5 - 5.1 mmol/L 4.5  4.4  4.3   Chloride 98 - 111 mmol/L 102  103  108   CO2 22 - 32 mmol/L 23  22  22    Calcium 8.9 - 10.3 mg/dL 8.8  9.1  9.0       Physical Exam: General: The patient is alert  and oriented x3 in no acute distress.   Dermatology: Loosely adhered tender hypertrophic discolored nail plate noted to the left hallux.  No purulence.  No drainage  Vascular: Palpable pedal pulses bilaterally. No edema or erythema noted. Capillary refill within normal limits.  Clinically no concern for vascular compromise  Neurological: Grossly intact via light touch  Musculoskeletal Exam: No structural deformity noted.  Patient ambulatory.  No pedal deformity    ASSESSMENT/PLAN OF CARE Symptomatic loosely adhered left hallux nail plate  -Patient seen at bedside -After discussing with the patient the nail avulsion procedure she states that she would like to proceed.  This procedure was discussed in detail as well as the post care instructions. -The toe was prepped in aseptic manner and digital block was performed using 3 mL of 2% lidocaine plain at bedside.  The nail was avulsed in its entirety and dressings applied.   -Post care instructions provided: Leave dressings clean dry  and intact x 24 hours.  After 24 hours nursing staff and patient may apply triple antibiotic ointment and a Band-Aid daily. -The nailbed appeared healthy and viable.  Toe was well-perfused during the procedure.  WBAT good supportive tennis shoes -Follow-up in office 3 weeks. -Will sign off     Thank you for the consult.  Please contact me directly via secure chat with any questions or concerns.     Felecia Shelling, DPM [facility]  Dr. Felecia Shelling, DPM    [address] [place], [place] [address]                Office [phone]  Fax [phone]

## 2022-12-11 NOTE — Progress Notes (Signed)
Mobility Specialist - Progress Note   12/11/22 1004  Mobility  Activity Ambulated with assistance in hallway  Level of Assistance Standby assist, set-up cues, supervision of patient - no hands on  Assistive Device Front wheel walker  Distance Ambulated (ft) 500 ft  Range of Motion/Exercises Active  Activity Response Tolerated well  Mobility Referral Yes  $Mobility charge 1 Mobility  Mobility Specialist Start Time (ACUTE ONLY) 0950  Mobility Specialist Stop Time (ACUTE ONLY) 1003  Mobility Specialist Time Calculation (min) (ACUTE ONLY) 13 min   Pt received in chair and agreed to mobility, had no issues throughout session. Returned to chair with all needs met.  Marilynne Halsted Mobility Specialist

## 2022-12-12 DIAGNOSIS — N390 Urinary tract infection, site not specified: Secondary | ICD-10-CM | POA: Diagnosis not present

## 2022-12-12 DIAGNOSIS — R531 Weakness: Secondary | ICD-10-CM | POA: Diagnosis not present

## 2022-12-12 LAB — COMPREHENSIVE METABOLIC PANEL
ALT: 10 U/L (ref 0–44)
AST: 12 U/L — ABNORMAL LOW (ref 15–41)
Albumin: 3.1 g/dL — ABNORMAL LOW (ref 3.5–5.0)
Alkaline Phosphatase: 55 U/L (ref 38–126)
Anion gap: 10 (ref 5–15)
BUN: 15 mg/dL (ref 8–23)
CO2: 23 mmol/L (ref 22–32)
Calcium: 8.8 mg/dL — ABNORMAL LOW (ref 8.9–10.3)
Chloride: 104 mmol/L (ref 98–111)
Creatinine, Ser: 1.2 mg/dL — ABNORMAL HIGH (ref 0.44–1.00)
GFR, Estimated: 46 mL/min — ABNORMAL LOW (ref >60)
Glucose, Bld: 217 mg/dL — ABNORMAL HIGH (ref 70–99)
Potassium: 4.5 mmol/L (ref 3.5–5.1)
Sodium: 137 mmol/L (ref 135–145)
Total Bilirubin: 0.4 mg/dL (ref 0.3–1.2)
Total Protein: 5.9 g/dL — ABNORMAL LOW (ref 6.5–8.1)

## 2022-12-12 LAB — GLUCOSE, CAPILLARY
Glucose-Capillary: 226 mg/dL — ABNORMAL HIGH (ref 70–99)
Glucose-Capillary: 308 mg/dL — ABNORMAL HIGH (ref 70–99)
Glucose-Capillary: 117 mg/dL — ABNORMAL HIGH (ref 70–99)
Glucose-Capillary: 249 mg/dL — ABNORMAL HIGH (ref 70–99)

## 2022-12-12 LAB — CBC
HCT: 31.6 % — ABNORMAL LOW (ref 36.0–46.0)
Hemoglobin: 10.3 g/dL — ABNORMAL LOW (ref 12.0–15.0)
MCH: 30.5 pg (ref 26.0–34.0)
MCHC: 32.6 g/dL (ref 30.0–36.0)
MCV: 93.5 fL (ref 80.0–100.0)
Platelets: 177 10*3/uL (ref 150–400)
RBC: 3.38 MIL/uL — ABNORMAL LOW (ref 3.87–5.11)
RDW: 12.9 % (ref 11.5–15.5)
WBC: 5.8 10*3/uL (ref 4.0–10.5)
nRBC: 0 % (ref 0.0–0.2)

## 2022-12-12 LAB — URINE CULTURE: Culture: 60000 — AB

## 2022-12-12 MED ORDER — CEFADROXIL 500 MG PO CAPS: 500.0000 mg | ORAL_CAPSULE | Freq: Two times a day (BID) | ORAL | 0 refills | Status: AC

## 2022-12-12 NOTE — Inpatient Diabetes Management (Signed)
Inpatient Diabetes Program Recommendations  AACE/ADA: New Consensus Statement on Inpatient Glycemic Control (2015)  Target Ranges:  Prepandial:   less than 140 mg/dL      Peak postprandial:   less than 180 mg/dL (1-2 hours)      Critically ill patients:  140 - 180 mg/dL   Lab Results  Component Value Date   GLUCAP 308 (H) 12/12/2022   HGBA1C 8.0 (A) 08/20/2022    Review of Glycemic Control  Latest Reference Range & Units 12/11/22 07:32 12/11/22 11:42 12/11/22 12:53 12/11/22 17:05 12/11/22 21:24 12/12/22 07:13 12/12/22 11:46  Glucose-Capillary 70 - 99 mg/dL 409 (H) 811 (H) 914 (H) 139 (H) 225 (H) 226 (H) 308 (H)  (H): Data is abnormally high  Diabetes history: DM2 Outpatient Diabetes medications:  Glipizide 10 mg BID Metformin XR 500 mg BID Current orders for Inpatient glycemic control:  Semglee 10 units QD Novolog 0-6 units TID and 0-5 units QHS  Inpatient Diabetes Program Recommendations:    Levemir 12 units every day Novolog 3 units TID with meals if she consumes at least 50%  Will continue to follow while inpatient.  Thank you, Dulce Sellar, MSN, CDCES Diabetes Coordinator Inpatient Diabetes Program [phone] (team pager from 8a-5p)

## 2022-12-12 NOTE — Discharge Summary (Signed)
Physician Discharge Summary   Patient: Maria Fox MRN: [id number] DOB: 1945-05-05  Admit date:     12/09/2022  Discharge date: 12/12/22  Discharge Physician: Rickey Barbara   PCP: Eustaquio Boyden, MD   Recommendations at discharge:    Follow up with PCP in 1-2 weeks  Discharge Diagnoses: Principal Problem:   Acute cystitis Active Problems:   Essential hypertension   Chronic kidney disease, stage 3a ([name])   Chronic diastolic CHF (congestive heart failure) ([name])   GERD (gastroesophageal reflux disease)   HLD (hyperlipidemia)   Chronic anemia   Hx of atrial flutter   Non-insulin treated type 2 diabetes mellitus ([name])   GAD (generalized anxiety disorder)   Glaucoma (increased eye pressure)   ARF (acute renal failure) ([name])  Resolved Problems:   CAD, multiple vessel  Hospital Course: 77yo with h/o CAD s/p CABG, aflutter with 2:1 block, orthostasis due to autonomia, stage 3a CKD, chronic diastolic CHF, DM, and HLD who presented on 9/30 with urinary retention.  Prior Klebsiella UTI in 08/2022.  She was diagnosed with acute cystitis and started on Ceftriaxone.    Assessment and Plan: Acute cystitis -Patient presenting without sepsis physiology but with symptoms concerning for recurrent UTI -Prior UTI in 02/2022 with >100k colonies of Klebsiella and 60k colonies of E coli - resistant to ampicillin and nitrofurantoin -Presenting UA with moderate LE but otherwise fairly unremarkable; clinically she has a UTI so will continue treatment while pending urine culture -Continued with Rocephin this visit, to complete course with cefadroxil on d/c -Urine cx notable for klebsiella sensitive to cefazolin, cefepime, rocephin, cipro, gentamicin, imipenem, bactrim   CAD, atrial flutter -s/p CABG -Echo in 02/2022 with preserved EF  -Unable to tolerate ACE/ARB due to orthostasis -Continue Toprol XL, Imdur -Continue Eliquis and Plavix   HTN with Orthostatic hypotension -Continue Toprol XL    Non-insulin independent DM type II -Last A1c was 8 in 08/2022, suboptimal control -Hold glipizide and metformin -Will cover with moderate-scale SSI -Continued gabapentin -did require dose of semglee 10 units this visit for glucose in excess of 400's -Pt did later admit to drinking fruit juices -would cont home meds on d/c   Acute on CKD stage 3A -Cr improved with IVF   Anaemia of chronic disease due to CKD -Stable H&H    Hyperlipidemia -Continue Lipitor   Generalized anxiety disorder -Continued Cymbalta    History of GERD -Continue Protonix 40 mg twice daily   Generalized weakness -Patient presenting with generalized weakness secondary to in the setting of acute cystitis. -Therapy recs for HHPT.   Glaucoma -Continue Cosopt, latanoprost        Consultants: Podiatry Procedures performed: Podiatry treatment for symptomatic loosely adhered L hallux nail plate  Disposition: Home Diet recommendation:  Carb modified diet DISCHARGE MEDICATION: Allergies as of 12/12/2022       Reactions   Bee Venom Anaphylaxis, Swelling, Other (See Comments)   Throat swelling   Mushroom Extract Complex Anaphylaxis   Penicillins Anaphylaxis, Hives, Swelling   Tolerates cephalosporins including cephalexin multiple times.  Has patient had a PCN reaction causing immediate rash, facial/tongue/throat swelling, SOB or lightheadedness with hypotension: Yes Has patient had a PCN reaction causing severe rash involving mucus membranes or skin necrosis: Yes Has patient had a PCN reaction that required hospitalization Yes Has patient had a PCN reaction occurring within the last 10 years: No   Codeine Nausea And Vomiting   Sulfa Antibiotics Nausea And Vomiting   Iohexol Itching, Swelling   Erythromycin  Base Rash        Medication List     STOP taking these medications    bacitracin 500 UNIT/GM ointment   cyanocobalamin 1000 MCG tablet Commonly known as: VITAMIN B12   furosemide 20 MG  tablet Commonly known as: LASIX   saccharomyces boulardii 250 MG capsule Commonly known as: FLORASTOR       TAKE these medications    acyclovir ointment 5 % Commonly known as: ZOVIRAX Apply 1 Application topically every 3 (three) hours as needed (for fever blisters).   albuterol 108 (90 Base) MCG/ACT inhaler Commonly known as: ProAir HFA Inhale 2 puffs into the lungs every 6 (six) hours as needed for wheezing or shortness of breath.   apixaban 5 MG Tabs tablet Commonly known as: Eliquis Take 1 tablet (5 mg total) by mouth 2 (two) times daily.   atorvastatin 40 MG tablet Commonly known as: LIPITOR TAKE ONE TABLET BY MOUTH EVERY EVENING What changed: additional instructions   cefadroxil 500 MG capsule Commonly known as: DURICEF Take 1 capsule (500 mg total) by mouth 2 (two) times daily for 2 days. Start taking on: December 13, 2022   clopidogrel 75 MG tablet Commonly known as: PLAVIX Take 1 tablet (75 mg total) by mouth daily.   dorzolamide-timolol 2-0.5 % ophthalmic solution Commonly known as: COSOPT Place 1 drop into both eyes 2 (two) times daily.   DULoxetine 60 MG capsule Commonly known as: CYMBALTA Take 1 capsule (60 mg total) by mouth daily.   gabapentin 300 MG capsule Commonly known as: NEURONTIN TAKE ONE CAPSULE BY MOUTH TWICE DAILY What changed: when to take this   glipiZIDE 10 MG tablet Commonly known as: GLUCOTROL TAKE ONE (1) TABLET BY MOUTH TWICE DAILY *REFILL REQUEST* What changed: See the new instructions.   isosorbide mononitrate 30 MG 24 hr tablet Commonly known as: IMDUR TAKE 1/2 TABLET BY MOUTH ONCE DAILY What changed:  how much to take how to take this when to take this additional instructions   latanoprost 0.005 % ophthalmic solution Commonly known as: XALATAN Place 1 drop into both eyes at bedtime.   metFORMIN 500 MG 24 hr tablet Commonly known as: GLUCOPHAGE-XR TAKE 2 TABLETS BY MOUTH EVERY MORNING   metoprolol succinate 25 MG  24 hr tablet Commonly known as: Toprol XL Take 1 tablet (25 mg total) by mouth daily.   nitroGLYCERIN 0.4 MG SL tablet Commonly known as: NITROSTAT Place 1 tablet (0.4 mg total) under the tongue every 5 (five) minutes as needed for chest pain.   ondansetron 4 MG tablet Commonly known as: ZOFRAN Take 1 tablet (4 mg total) by mouth every 8 (eight) hours as needed for nausea or vomiting.   pantoprazole 40 MG tablet Commonly known as: PROTONIX TAKE 1 TABLET BY MOUTH TWICE DAILY *REFILL REQUEST* What changed: See the new instructions.   phenazopyridine 100 MG tablet Commonly known as: PYRIDIUM Take 1 tablet (100 mg total) by mouth 2 (two) times daily as needed (dysuria).   traMADol 50 MG tablet Commonly known as: ULTRAM Take 50 mg by mouth every 6 (six) hours as needed for moderate pain.   TYLENOL 500 MG tablet Generic drug: acetaminophen Take 500-1,000 mg by mouth every 6 (six) hours as needed for mild pain or headache.        Discharge Exam: Filed Weights   12/09/22 1408  Weight: 60.3 kg   General exam: Awake, laying in bed, in nad Respiratory system: Normal respiratory effort, no wheezing Cardiovascular system: regular  rate, s1, s2 Gastrointestinal system: Soft, nondistended, positive BS Central nervous system: CN2-12 grossly intact, strength intact Extremities: Perfused, no clubbing Skin: Normal skin turgor, no notable skin lesions seen Psychiatry: Mood normal // no visual hallucinations   Condition at discharge: fair  The results of significant diagnostics from this hospitalization (including imaging, microbiology, ancillary and laboratory) are listed below for reference.   Imaging Studies: No results found.  Microbiology: Results for orders placed or performed during the hospital encounter of 12/09/22  Urine Culture (for pregnant, neutropenic or urologic patients or patients with an indwelling urinary catheter)     Status: Abnormal   Collection Time: 12/09/22   7:18 PM   Specimen: Urine, Clean Catch  Result Value Ref Range Status   Specimen Description   Final    URINE, CLEAN CATCH Performed at [facility], [address] [place], [place] [address]    Special Requests   Final    NONE Performed at [facility], [address], [place], [place] [address]    Culture 60,000 COLONIES/mL KLEBSIELLA PNEUMONIAE (A)  Final   Report Status 12/12/2022 FINAL  Final   Organism ID, Bacteria KLEBSIELLA PNEUMONIAE (A)  Final      Susceptibility   Klebsiella pneumoniae - MIC*    AMPICILLIN >=32 RESISTANT Resistant     CEFAZOLIN <=4 SENSITIVE Sensitive     CEFEPIME <=0.12 SENSITIVE Sensitive     CEFTRIAXONE <=0.25 SENSITIVE Sensitive     CIPROFLOXACIN <=0.25 SENSITIVE Sensitive     GENTAMICIN <=1 SENSITIVE Sensitive     IMIPENEM <=0.25 SENSITIVE Sensitive     NITROFURANTOIN 64 INTERMEDIATE Intermediate     TRIMETH/SULFA <=20 SENSITIVE Sensitive     AMPICILLIN/SULBACTAM >=32 RESISTANT Resistant     PIP/TAZO 64 INTERMEDIATE Intermediate     * 60,000 COLONIES/mL KLEBSIELLA PNEUMONIAE    Labs: CBC: Recent Labs  Lab 12/09/22 1618 12/10/22 0553 12/11/22 0516 12/12/22 0547  WBC 6.9 7.5 5.5 5.8  NEUTROABS 4.2  --   --   --   HGB 11.0* 12.1 10.4* 10.3*  HCT 34.3* 36.6 32.1* 31.6*  MCV 93.2 92.0 93.3 93.5  PLT 210 232 182 177   Basic Metabolic Panel: Recent Labs  Lab 12/09/22 1618 12/10/22 0553 12/11/22 0516 12/12/22 0547  NA 138 135 135 137  K 4.3 4.4 4.5 4.5  CL 108 103 102 104  CO2 22 22 23 23   GLUCOSE 149* 230* 244* 217*  BUN 15 16 20 15   CREATININE 1.27* 1.37* 1.47* 1.20*  CALCIUM 9.0 9.1 8.8* 8.8*   Liver Function Tests: Recent Labs  Lab 12/09/22 1618 12/10/22 0553 12/11/22 0516 12/12/22 0547  AST 14* 13* 13* 12*  ALT 11 12 11 10   ALKPHOS 66 65 62 55  BILITOT 0.7 0.8 0.2* 0.4  PROT 7.0 7.1 6.1* 5.9*  ALBUMIN 3.6 3.6 3.2* 3.1*   CBG: Recent Labs  Lab 12/11/22 1253 12/11/22 1705  12/11/22 2124 12/12/22 0713 12/12/22 1146  GLUCAP 432* 139* 225* 226* 308*    Discharge time spent: less than 30 minutes.  Signed: Rickey Barbara, MD [facility] 12/12/2022

## 2022-12-12 NOTE — Plan of Care (Signed)
Plan of Care reviewed. 

## 2022-12-12 NOTE — Progress Notes (Signed)
PT Cancellation Note  Patient Details Name: Maria Fox MRN: [id number] DOB: Jan 26, 1946   Cancelled Treatment:    Reason Eval/Treat Not Completed: Patient declined, no reason specified Pt declines today due to having "accident" in bed and awaiting nursing care, also reports not feeling well today.   Kati L Payson 12/12/2022, 10:52 AM

## 2022-12-15 NOTE — Progress Notes (Deleted)
Cardiology Office Note:  .   Date:  12/15/2022  ID:  Maria Fox, DOB March 24, 1945, MRN [id number] PCP: Eustaquio Boyden, MD  [facility] Providers Cardiologist:  Armanda Magic, MD { Click to update primary MD,subspecialty MD or APP then REFRESH:1}   Patient Profile: .      PMH Ventricular tachycardia Coronary artery disease S/p CABG x 3 in 2016 Right bundle branch block Orthostatic hypotension 2/2 autonomic dysfunction from diabetes Dizziness with multifocal infarcts by MRI Chronic HFpEF  Initially seen by Dr. Algie Coffer January 2016 for ventricular tachycardia.  Nuclear stress test showed normal perfusion but EF was reduced at 45%.  2D echo showed EF 65%.  She was then admitted to hospital in [place] with chest pain and diagnosed with NSTEMI.  She underwent three-vessel bypass.  She had problems with orthostatic hypotension after surgery and was started on midodrine.  Symptoms felt to be due to autonomic dysfunction from her diabetes.  During workup of dizziness, an MRI showed multifocal infarct in the corona radiata bilaterally which were felt to have occurred during CABG.  She had residual pleural effusion post CABG and was rehospitalized in May with nausea.  2D echo showed normal LVEF.  She had acute diastolic CHF and was diuresed initially with IV Lasix but diuretics had to be stopped due to orthostatic hypotension.  She had recurrent NSVT during admission and beta-blocker was adjusted.  Hospitalized 2019 with dyspnea on exertion and underwent nuclear stress testing which showed concern for ischemia.  Cardiac catheterization revealed mid LAD and significant disease in mid left circumflex.  She underwent DES to left circumflex.  LIMA to LAD was patent, SVG to OM 3 was occluded and SVG to diagonal could not be found and was assumed to be occluded as well.  Despite PCI of LCx she continued to have DOE and was referred to pulmonary.  She was diagnosed with emphysema.  Lexiscan  Myoview for chest pain 2020 showed no ischemia.  Lap chole April 2021 complicated by narrow complex tachycardia.  She also had acute DVT/PE as well. Tachycardia was felt to be atrial flutter with 2-1 block.  She was placed on apixaban for management of DVT/PE.  Last cardiology clinic visit was 04/05/2022 with Dr. Mayford Knife.  She reported occasional dizziness when standing but it is more related to feeling off balance.  BP was soft so Toprol XL was decreased from 37.5 mg daily to 25 mg daily.  She was taking Lasix 20 mg daily as needed for edema.  No additional changes were made to treatment plan and 66-month follow-up was recommended.       History of Present Illness: .   Maria Fox is a *** 77 y.o. female who is here today  ROS: ***       Studies Reviewed: .        *** Risk Assessment/Calculations:    CHA2DS2-VASc Score =    {Confirm score is correct.  If not, click here to update score.  REFRESH note.  :1} This indicates a  % annual risk of stroke. The patient's score is based upon:    {This patient has a significant risk of stroke if diagnosed with atrial fibrillation.  Please consider VKA or DOAC agent for anticoagulation if the bleeding risk is acceptable.   You can also use the SmartPhrase .[email or web address] for documentation.   :[id number]} No BP recorded.  {Refresh Note OR Click here to enter BP  :1}***  Physical Exam:   VS:  There were no vitals taken for this visit.   Wt Readings from Last 3 Encounters:  12/09/22 133 lb (60.3 kg)  12/03/22 133 lb (60.3 kg)  09/20/22 133 lb (60.3 kg)    GEN: Well nourished, well developed in no acute distress NECK: No JVD; No carotid bruits CARDIAC: ***RRR, no murmurs, rubs, gallops RESPIRATORY:  Clear to auscultation without rales, wheezing or rhonchi  ABDOMEN: Soft, non-tender, non-distended EXTREMITIES:  No edema; No deformity     ASSESSMENT AND PLAN: .    CAD: NSVT: History of DVT/PE: Hypertension: Hyperlipidemia:  Chronic  HFpEF: Atrial flutter: Occurred in the setting of acute DVT/PE 06/2019. Has been maintained on     {Are you ordering a CV Procedure (e.g. stress test, cath, DCCV, TEE, etc)?   Press F2        :[id number]}  Dispo: ***  Signed, Eligha Bridegroom, NP-C

## 2022-12-16 ENCOUNTER — Telehealth: Payer: Self-pay | Admitting: *Deleted

## 2022-12-16 ENCOUNTER — Ambulatory Visit: Payer: Self-pay

## 2022-12-16 NOTE — Transitions of Care (Post Inpatient/ED Visit) (Signed)
12/16/2022  Name: Maria Fox MRN: [id number] DOB: 30-Oct-1945  Today's TOC FU Call Status: Today's TOC FU Call Status:: Successful TOC FU Call Completed TOC FU Call Complete Date: 12/16/22 Patient's Name and Date of Birth confirmed.  Transition Care Management Follow-up Telephone Call Date of Discharge: 12/13/22 Discharge Facility: [facility]) How have you been since you were released from the hospital?: Better Any questions or concerns?: Yes Patient Questions/Concerns:: where can I get my medication like [place]. Patient Questions/Concerns Addressed: Other: ([facility] and [facility]. RN pulled list of pharmacies and told the patient the name of some that do. Patient is going to take her pack and go to those pharmacies i gave her.)  Items Reviewed: Did you receive and understand the discharge instructions provided?: Yes Medications obtained,verified, and reconciled?: Yes (Medications Reviewed) Any new allergies since your discharge?: No Dietary orders reviewed?: No Do you have support at home?: Yes People in Home: significant other Name of Support/Comfort Primary Source: [name]  Medications Reviewed Today: Medications Reviewed Today     Reviewed by Luella Cook, RN (Case Manager) on 12/16/22 at 1402  Med List Status: <None>   Medication Order Taking? Sig Documenting Provider Last Dose Status Informant  acyclovir ointment (ZOVIRAX) 5 % [id number] Yes Apply 1 Application topically every 3 (three) hours as needed (for fever blisters). [provider] Taking Active Self  albuterol (PROAIR HFA) 108 (90 Base) MCG/ACT inhaler [id number] Yes Inhale 2 puffs into the lungs every 6 (six) hours as needed for wheezing or shortness of breath. Eustaquio Boyden, MD Taking Active Self  apixaban (ELIQUIS) 5 MG TABS tablet [id number] Yes Take 1 tablet (5 mg total) by mouth 2 (two) times daily. Quintella Reichert, MD Taking Active Self   atorvastatin (LIPITOR) 40 MG tablet [id number] Yes TAKE ONE TABLET BY MOUTH EVERY EVENING  Patient taking differently: Take 40 mg by mouth every evening. TAKE ONE TABLET BY MOUTH EVERY Onalee Hua, MD Taking Active Self  clopidogrel (PLAVIX) 75 MG tablet [id number] Yes Take 1 tablet (75 mg total) by mouth daily. Quintella Reichert, MD Taking Active Self  dorzolamide-timolol (COSOPT) 2-0.5 % ophthalmic solution [id number] Yes Place 1 drop into both eyes 2 (two) times daily. [provider] Taking Active Self  DULoxetine (CYMBALTA) 60 MG capsule [id number] Yes Take 1 capsule (60 mg total) by mouth daily. Eustaquio Boyden, MD Taking Active Self  gabapentin (NEURONTIN) 300 MG capsule [id number] Yes TAKE ONE CAPSULE BY MOUTH TWICE DAILY  Patient taking differently: Take 300 mg by mouth in the morning and at bedtime.   Eustaquio Boyden, MD Taking Active Self  glipiZIDE (GLUCOTROL) 10 MG tablet [id number] Yes TAKE ONE (1) TABLET BY MOUTH TWICE DAILY *REFILL REQUEST*  Patient taking differently: Take 10 mg by mouth 2 (two) times daily before a meal.   Eustaquio Boyden, MD Taking Active Self  isosorbide mononitrate (IMDUR) 30 MG 24 hr tablet [id number] Yes TAKE 1/2 TABLET BY MOUTH ONCE DAILY  Patient taking differently: Take 15 mg by mouth daily.   Quintella Reichert, MD Taking Active Self  latanoprost (XALATAN) 0.005 % ophthalmic solution [id number] Yes Place 1 drop into both eyes at bedtime.  [provider] Taking Active Self           Med Note Sharlynn Oliphant Dec 09, 2022  8:53 PM)    metFORMIN (GLUCOPHAGE-XR) 500 MG 24 hr tablet [id number]  Yes TAKE 2 TABLETS BY MOUTH EVERY MORNING Eustaquio Boyden, MD Taking Active Self  metoprolol succinate (TOPROL XL) 25 MG 24 hr tablet [id number] Yes Take 1 tablet (25 mg total) by mouth daily. Quintella Reichert, MD Taking Active Self  nitroGLYCERIN (NITROSTAT) 0.4 MG SL tablet [id number] Yes Place 1 tablet (0.4 mg total) under the tongue  every 5 (five) minutes as needed for chest pain. Quintella Reichert, MD Taking Active Self  ondansetron (ZOFRAN) 4 MG tablet [id number] Yes Take 1 tablet (4 mg total) by mouth every 8 (eight) hours as needed for nausea or vomiting. Eustaquio Boyden, MD Taking Active Self           Med Note Antony Madura, Ladona Mow Dec 09, 2022  9:08 PM) PROVIDER- The patient requested a NEW Rx for this, please  pantoprazole (PROTONIX) 40 MG tablet [id number] Yes TAKE 1 TABLET BY MOUTH TWICE DAILY *REFILL REQUEST*  Patient taking differently: Take 40 mg by mouth 2 (two) times daily before a meal.   Eustaquio Boyden, MD Taking Active Self  phenazopyridine (PYRIDIUM) 100 MG tablet [id number] Yes Take 1 tablet (100 mg total) by mouth 2 (two) times daily as needed (dysuria). Alba Cory, MD Taking Active Self           Med Note Sharlynn Oliphant Dec 09, 2022  9:10 PM) PROVIDER- The patient requested a NEW Rx for this, please  traMADol (ULTRAM) 50 MG tablet [id number] Yes Take 50 mg by mouth every 6 (six) hours as needed for moderate pain. [provider] Taking Active Self  TYLENOL 500 MG tablet [id number] Yes Take 500-1,000 mg by mouth every 6 (six) hours as needed for mild pain or headache. [provider] Taking Active Self            Home Care and Equipment/Supplies: Were Home Health Services Ordered?: NA  Functional Questionnaire: Do you need assistance with bathing/showering or dressing?: No Do you need assistance with meal preparation?: No  Follow up appointments reviewed: PCP Follow-up appointment confirmed?: Yes Date of PCP follow-up appointment?: 12/27/22 Follow-up Provider: [facility] Follow-up appointment confirmed?: NA Do you need transportation to your follow-up appointment?: No Do you understand care options if your condition(s) worsen?: Yes-patient verbalized understanding  SDOH Interventions Today    Flowsheet Row Most Recent Value  SDOH  Interventions   Food Insecurity Interventions Intervention Not Indicated  Housing Interventions Intervention Not Indicated  Transportation Interventions Intervention Not Indicated, Patient Resources (Friends/Family)      Interventions Today    Flowsheet Row Most Recent Value  General Interventions   General Interventions Discussed/Reviewed General Interventions Discussed, General Interventions Reviewed  [facility]/ [name] Care Coordinator]  Pharmacy Interventions   Pharmacy Dicussed/Reviewed Pharmacy Topics Discussed, Pharmacy Topics Reviewed  [location of pharmacies that blister pack]      TOC Interventions Today    Flowsheet Row Most Recent Value  TOC Interventions   TOC Interventions Discussed/Reviewed Arranged PCP follow up less than 12 days/Care Guide scheduled       Patient declined 4 outreach calls to be done in 4 weeks since working with Care Coordinator [name]. She would like 1 additional follow up call to make sure she has gotten a new pharmacy for blister pack medications.   Gean Maidens RN will follow up outreach [phone]  Gean Maidens BSN RN [facility] Care Management [phone]

## 2022-12-16 NOTE — Patient Instructions (Signed)
Visit Information  Thank you for taking time to visit with me today. Please don't hesitate to contact me if I can be of assistance to you.   Following are the goals we discussed today:   Goals Addressed             This Visit's Progress    Management of chronic health conditions.       Interventions Today    Flowsheet Row Most Recent Value  Chronic Disease   Chronic disease during today's visit Other  [recurring UTI's]  General Interventions   General Interventions Discussed/Reviewed General Interventions Reviewed, Doctor Visits  [evaluation of current treatment plan for UTI's and patients adherence to plan as established by provider.  Assessed for UTI symptoms.]  Doctor Visits Discussed/Reviewed Doctor Visits Reviewed  [reviewed upcoming post hospital follow up visit with primary care provider. Confirmed patient has transportation to appointment.]  Education Interventions   Education Provided Provided Education  Provided Verbal Education On When to see the doctor  [Advised to contact provider for UTI symptoms]  Nutrition Interventions   Nutrition Discussed/Reviewed Nutrition Reviewed  [advised to stay hydrated.]  Pharmacy Interventions   Pharmacy Dicussed/Reviewed Pharmacy Topics Reviewed, Referral to Pharmacist  [facility]ef[facility] prescribed from hospital admission.]  Referral to Pharmacist --  [referral for medication management]              Our next appointment is by telephone on 01/03/23 at 2 pm  Please call the care guide team at [phone] if you need to cancel or reschedule your appointment.   If you are experiencing a Mental Health or Behavioral Health Crisis or need someone to talk to, please call the Suicide and Crisis Lifeline: 988 call 1-800-273-TALK (toll free, 24 hour hotline)  The patient verbalized understanding of instructions, educational materials, and care plan provided today and agreed to receive a mailed copy  of patient instructions, educational materials, and care plan.   [name] RN,BSN,CCM [facility] Care Coordination [phone] direct line

## 2022-12-16 NOTE — Patient Outreach (Signed)
Care Coordination   Follow Up Visit Note   12/16/2022 Name: Maria Fox MRN: [id number] DOB: [birth year]  Maria Fox is a 77 y.o. year old female who sees Eustaquio Boyden, MD for primary care. I spoke with  Maria Fox by phone today.  What matters to the patients health and wellness today?  Patient states she is scheduled to follow up with her primary care provider on 12/27/22.  She reports UTI symptoms have somewhat improved.  Patient states she needs assistance with obtaining a new pharmacy that will fill her prescriptions in the blister packs.  She reports speaking with a nurse this morning who provided her several pharmacies that could do blister pack medications fills. She states she is still somewhat confused with what she needs to do.   [facility] this RNCM  to inquire if antibiotic Cefodroxil prescription received.  Contact made with Raul Del, pharmacist who states that Cefodroxil prescription was received and is ready for pickup.  Patient notified she can pick up [facility]. Patient verbalized appreciation for the assistance.   Goals Addressed             This Visit's Progress    Management of chronic health conditions.       Interventions Today    Flowsheet Row Most Recent Value  Chronic Disease   Chronic disease during today's visit Other  [recurring UTI's]  General Interventions   General Interventions Discussed/Reviewed General Interventions Reviewed, Doctor Visits  [evaluation of current treatment plan for UTI's and patients adherence to plan as established by provider.  Assessed for UTI symptoms.]  Doctor Visits Discussed/Reviewed Doctor Visits Reviewed  [reviewed upcoming post hospital follow up visit with primary care provider. Confirmed patient has transportation to appointment.]  Education Interventions   Education Provided Provided Education  Provided Verbal Education On When to see the doctor  [Advised to contact  provider for UTI symptoms]  Nutrition Interventions   Nutrition Discussed/Reviewed Nutrition Reviewed  [advised to stay hydrated.]  Pharmacy Interventions   Pharmacy Dicussed/Reviewed Pharmacy Topics Reviewed, Referral to Pharmacist  [[facility] if antibiotic Cefod[facility]il prescribed from hospital admission.]  Referral to Pharmacist --  [referral for medication management]              SDOH assessments and interventions completed:  No     Care Coordination Interventions:  Yes, provided   Follow up plan: Follow up call scheduled for 01/03/23    Encounter Outcome:  Patient Visit Completed   [name] RN,BSN,CCM [facility] Care Coordination [phone] direct line

## 2022-12-17 ENCOUNTER — Telehealth: Payer: Self-pay

## 2022-12-17 ENCOUNTER — Ambulatory Visit: Payer: 59 | Admitting: Nurse Practitioner

## 2022-12-17 NOTE — Patient Outreach (Signed)
Care Coordination   12/17/2022 Name: Maria Fox MRN: [id number] DOB: 04-14-1945   Care Coordination Outreach Attempts:  An unsuccessful telephone outreach was attempted today to offer the patient information about available care coordination services.  Call attempt to patient x 2.  Unable to reach patient or leave voice message due to mail box being full.   Follow Up Plan:  Additional outreach attempts will be made to offer the patient care coordination information and services.   Encounter Outcome:  No Answer   Care Coordination Interventions:  No, not indicated    [name] [facility] Care Coordination [phone] direct line

## 2022-12-17 NOTE — Telephone Encounter (Signed)
This encounter was created in error - please disregard.

## 2022-12-19 ENCOUNTER — Telehealth: Payer: Self-pay

## 2022-12-19 ENCOUNTER — Ambulatory Visit: Payer: 59 | Admitting: Nurse Practitioner

## 2022-12-19 NOTE — Progress Notes (Signed)
Care Guide Note  12/19/2022 Name: Maria Fox MRN: [id number] DOB: [birth year]  Referred by: Eustaquio Boyden, MD Reason for referral : Care Coordination (Outreach to schedule with Pharm d )   [name] is a 77 y.o. year old female who is a primary care patient of Eustaquio Boyden, MD. [name] was referred to the pharmacist for assistance related to HLD and DM.    An unsuccessful telephone outreach was attempted today to contact the patient who was referred to the pharmacy team for assistance with medication management. Additional attempts will be made to contact the patient.   Penne Lash, RMA Care Guide [facility]  [place], [place] [address] Direct Dial: [phone] [email or web address]

## 2022-12-19 NOTE — Patient Outreach (Signed)
Care Coordination   12/19/2022 Name: Maria Fox MRN: [id number] DOB: 10-27-1945   Care Coordination Outreach Attempts:  An unsuccessful telephone outreach was attempted today to offer the patient information about available care coordination services. Unable to reach patient or leave message due to mailbox being full.   Follow Up Plan:  Additional outreach attempts will be made to offer the patient care coordination information and services.   Encounter Outcome:  No Answer   Care Coordination Interventions:  No, not indicated    [name] [facility] Care Coordination [phone] direct line

## 2022-12-24 ENCOUNTER — Ambulatory Visit: Payer: Self-pay | Admitting: *Deleted

## 2022-12-24 NOTE — Patient Outreach (Signed)
Care Management  Transitions of Care Program Transitions of Care Post-discharge week 2   12/24/2022 Name: Maria Fox MRN: [id number] DOB: 02/15/1946  Subjective: Maria Fox is a 77 y.o. year old female who is a primary care patient of Eustaquio Boyden, MD. The Care Management team Engaged with patient Engaged with patient by telephone to assess and address transitions of care needs.   Consent to Services:  Patient was given information about care management services, agreed to services, and gave verbal consent to participate.   Assessment:     RN followed up with patient on medications. Patient was very confused. On what she received. RN went over all the medications patient received and discussed those she need orders for.       SDOH Interventions    Flowsheet Row Telephone from 12/16/2022 in [facility] Care Coordination Clinical Support from 12/03/2022 in [facility] Coordination from 09/10/2022 in [facility] Office Visit from 08/20/2022 in [facility] Telephone from 08/06/2022 in [facility] Care Coordination Telephone from 03/13/2022 in [facility] Care Coordination  SDOH Interventions        Food Insecurity Interventions Intervention Not Indicated Intervention Not Indicated Intervention Not Indicated -- Intervention Not Indicated Intervention Not Indicated  Housing Interventions Intervention Not Indicated Intervention Not Indicated Intervention Not Indicated -- Intervention Not Indicated Intervention Not Indicated  Transportation Interventions Intervention Not Indicated, Patient Resources Dietitian) Intervention Not Indicated Intervention Not Indicated -- Intervention Not Indicated Intervention Not Indicated  Utilities Interventions -- Intervention Not Indicated -- -- -- --  Alcohol Usage  Interventions -- Intervention Not Indicated (Score <7) -- -- -- --  Depression Interventions/Treatment  -- -- -- Medication, Currently on Treatment -- --  Financial Strain Interventions -- Intervention Not Indicated -- -- -- --  Physical Activity Interventions -- Intervention Not Indicated -- -- -- --  Stress Interventions -- Intervention Not Indicated -- -- -- --  Social Connections Interventions -- Intervention Not Indicated -- -- -- --  Health Literacy Interventions -- Intervention Not Indicated -- -- -- --        Goals Addressed   None     Plan: Telephone follow up appointment with care management team member scheduled for:01/01/2023 at 10:15 [facility]  Scarlette Calico Shakyia Bosso BSN RN [facility] Care Management [phone]

## 2022-12-25 ENCOUNTER — Other Ambulatory Visit: Payer: Self-pay | Admitting: Family Medicine

## 2022-12-25 DIAGNOSIS — E785 Hyperlipidemia, unspecified: Secondary | ICD-10-CM

## 2022-12-25 NOTE — Progress Notes (Signed)
Care Guide Note  12/25/2022 Name: Maria Fox MRN: [id number] DOB: [birth year]  Referred by: Eustaquio Boyden, MD Reason for referral : Care Coordination (Outreach to schedule with Pharm d )   Maria Fox is a 77 y.o. year old female who is a primary care patient of Eustaquio Boyden, MD. Maria Fox was referred to the pharmacist for assistance related to HLD and DM.    Successful contact was made with the patient to discuss pharmacy services including being ready for the pharmacist to call at least 5 minutes before the scheduled appointment time, to have medication bottles and any blood sugar or blood pressure readings ready for review. The patient agreed to meet with the pharmacist via with the pharmacist via telephone visit on (date/time).  01/02/2023  Maria Fox, RMA Care Guide [facility]  [place], [place] [address] Direct Dial: [phone] [email or web address]

## 2022-12-27 ENCOUNTER — Ambulatory Visit (INDEPENDENT_AMBULATORY_CARE_PROVIDER_SITE_OTHER): Payer: 59 | Admitting: Family Medicine

## 2022-12-27 ENCOUNTER — Encounter: Payer: Self-pay | Admitting: Family Medicine

## 2022-12-27 VITALS — BP 124/78 | HR 80 | Temp 97.6°F | Ht 63.0 in | Wt 136.4 lb

## 2022-12-27 DIAGNOSIS — R110 Nausea: Secondary | ICD-10-CM | POA: Diagnosis not present

## 2022-12-27 DIAGNOSIS — N390 Urinary tract infection, site not specified: Secondary | ICD-10-CM | POA: Diagnosis not present

## 2022-12-27 DIAGNOSIS — F331 Major depressive disorder, recurrent, moderate: Secondary | ICD-10-CM | POA: Diagnosis not present

## 2022-12-27 DIAGNOSIS — D649 Anemia, unspecified: Secondary | ICD-10-CM

## 2022-12-27 DIAGNOSIS — Z8673 Personal history of transient ischemic attack (TIA), and cerebral infarction without residual deficits: Secondary | ICD-10-CM

## 2022-12-27 DIAGNOSIS — K219 Gastro-esophageal reflux disease without esophagitis: Secondary | ICD-10-CM | POA: Diagnosis not present

## 2022-12-27 DIAGNOSIS — R413 Other amnesia: Secondary | ICD-10-CM | POA: Diagnosis not present

## 2022-12-27 DIAGNOSIS — I4892 Unspecified atrial flutter: Secondary | ICD-10-CM

## 2022-12-27 DIAGNOSIS — N1831 Chronic kidney disease, stage 3a: Secondary | ICD-10-CM

## 2022-12-27 DIAGNOSIS — Z951 Presence of aortocoronary bypass graft: Secondary | ICD-10-CM

## 2022-12-27 DIAGNOSIS — Z23 Encounter for immunization: Secondary | ICD-10-CM

## 2022-12-27 DIAGNOSIS — R300 Dysuria: Secondary | ICD-10-CM | POA: Diagnosis not present

## 2022-12-27 DIAGNOSIS — E113293 Type 2 diabetes mellitus with mild nonproliferative diabetic retinopathy without macular edema, bilateral: Secondary | ICD-10-CM

## 2022-12-27 DIAGNOSIS — E1149 Type 2 diabetes mellitus with other diabetic neurological complication: Secondary | ICD-10-CM | POA: Diagnosis not present

## 2022-12-27 LAB — POCT GLYCOSYLATED HEMOGLOBIN (HGB A1C): Hemoglobin A1C: 9.3 % — AB (ref 4.0–5.6)

## 2022-12-27 LAB — POC URINALSYSI DIPSTICK (AUTOMATED)
Glucose, UA: POSITIVE — AB
Ketones, UA: NEGATIVE
Nitrite, UA: NEGATIVE
Protein, UA: POSITIVE — AB
Spec Grav, UA: 1.025 (ref 1.010–1.025)
Urobilinogen, UA: 0.2 U/dL
pH, UA: 5.5 (ref 5.0–8.0)

## 2022-12-27 MED ORDER — ONDANSETRON HCL 4 MG PO TABS: 4.0000 mg | ORAL_TABLET | Freq: Three times a day (TID) | ORAL | 0 refills | Status: DC | PRN

## 2022-12-27 MED ORDER — PHENAZOPYRIDINE HCL 100 MG PO TABS: 100.0000 mg | ORAL_TABLET | Freq: Two times a day (BID) | ORAL | 0 refills | Status: DC | PRN

## 2022-12-27 NOTE — Progress Notes (Unsigned)
Ph: [phone] Fax: [phone]   Patient ID: Maria Fox, female    DOB: [birth year], 77 y.o.   MRN: [id number]  This visit was conducted in person.  BP 124/78   Pulse 80   Temp 97.6 F (36.4 C) (Oral)   Ht 5\' 3"  (1.6 m)   Wt 136 lb 6 oz (61.9 kg)   SpO2 99%   BMI 24.16 kg/m    CC: hosp f/u visit  Subjective:   HPI: Maria Fox is a 77 y.o. female presenting on 12/27/2022 for Hospitalization Follow-up (Admitted on 12/09/22 at [facility], dx UTI w/o hematuria; generalized weakness. )   Recent hospitalization for recurrent UTI presenting with urinary retention treated with ceftriaxone IV, transitioned to oral cefadroxil.  Hospital records reviewed. Med rec performed.  UCx grew klebsiella.   She notes ongoing dysuria, along with some incomplete emptying despite finishing oral antibiotics. She continues drinking a bottle of water about 4 times a day. Notes urinary symptoms didn't ever improve/resolve. No fevers/chills, vomiting, flank pain, hematuria.  Notes am nausea.   Last visit 08/2022 I referred her to urology for recurrent UTI, she did not go.  She now has appt for December 2024.  She did not see psychiatry or psychology previously referred.   Notes worsening confusion.  She asks about ALF - states partner is encouraging her to move there.   DM - treated with SSI during hospitalization, discharged back on regular glipizide and metformin doses.   She continues receiving medications through [facility] health was not set up .  Other follow up appointments scheduled: none ______________________________________________________________________ Hospital admission: 12/09/2022 Hospital discharge: 12/12/2022 TCM f/u phone call:  performed but seen too late for TCM visit  Recommendations at discharge:   Follow up with PCP in 1-2 weeks   Discharge Diagnoses: Principal Problem:   Acute cystitis Active Problems:   Essential hypertension   Chronic kidney disease, stage  3a ([name])   Chronic diastolic CHF (congestive heart failure) ([name])   GERD (gastroesophageal reflux disease)   HLD (hyperlipidemia)   Chronic anemia   Hx of atrial flutter   Non-insulin treated type 2 diabetes mellitus ([name])   GAD (generalized anxiety disorder)   Glaucoma (increased eye pressure)   ARF (acute renal failure) ([name])     Relevant past medical, surgical, family and social history reviewed and updated as indicated. Interim medical history since our last visit reviewed. Allergies and medications reviewed and updated. Outpatient Medications Prior to Visit  Medication Sig Dispense Refill   acyclovir ointment (ZOVIRAX) 5 % Apply 1 Application topically every 3 (three) hours as needed (for fever blisters).     albuterol (PROAIR HFA) 108 (90 Base) MCG/ACT inhaler Inhale 2 puffs into the lungs every 6 (six) hours as needed for wheezing or shortness of breath. 18 g 0   apixaban (ELIQUIS) 5 MG TABS tablet Take 1 tablet (5 mg total) by mouth 2 (two) times daily. 60 tablet 5   atorvastatin (LIPITOR) 40 MG tablet TAKE 1 TABLET BY MOUTH EVERY EVENING 90 tablet 0   clopidogrel (PLAVIX) 75 MG tablet Take 1 tablet (75 mg total) by mouth daily. 90 tablet 3   dorzolamide-timolol (COSOPT) 2-0.5 % ophthalmic solution Place 1 drop into both eyes 2 (two) times daily.     DULoxetine (CYMBALTA) 60 MG capsule Take 1 capsule (60 mg total) by mouth daily. 30 capsule 11   gabapentin (NEURONTIN) 300 MG capsule TAKE ONE CAPSULE BY MOUTH TWICE DAILY (Patient  taking differently: Take 300 mg by mouth in the morning and at bedtime.) 180 capsule 1   glipiZIDE (GLUCOTROL) 10 MG tablet TAKE ONE (1) TABLET BY MOUTH TWICE DAILY *REFILL REQUEST* (Patient taking differently: Take 10 mg by mouth 2 (two) times daily before a meal.) 60 tablet 10   isosorbide mononitrate (IMDUR) 30 MG 24 hr tablet TAKE 1/2 TABLET BY MOUTH ONCE DAILY (Patient taking differently: Take 15 mg by mouth daily.) 45 tablet 3   latanoprost (XALATAN)  0.005 % ophthalmic solution Place 1 drop into both eyes at bedtime.      metFORMIN (GLUCOPHAGE-XR) 500 MG 24 hr tablet TAKE TWO (2) TABLETS BY MOUTH EVERY MORNING 180 tablet 0   metoprolol succinate (TOPROL XL) 25 MG 24 hr tablet Take 1 tablet (25 mg total) by mouth daily. 90 tablet 3   nitroGLYCERIN (NITROSTAT) 0.4 MG SL tablet Place 1 tablet (0.4 mg total) under the tongue every 5 (five) minutes as needed for chest pain. 25 tablet 10   pantoprazole (PROTONIX) 40 MG tablet TAKE 1 TABLET BY MOUTH TWICE DAILY *REFILL REQUEST* (Patient taking differently: Take 40 mg by mouth 2 (two) times daily before a meal.) 60 tablet 10   traMADol (ULTRAM) 50 MG tablet Take 50 mg by mouth every 6 (six) hours as needed for moderate pain (pain score 4-6).     TYLENOL 500 MG tablet Take 500-1,000 mg by mouth every 6 (six) hours as needed for mild pain or headache.     ondansetron (ZOFRAN) 4 MG tablet Take 1 tablet (4 mg total) by mouth every 8 (eight) hours as needed for nausea or vomiting. 20 tablet 0   phenazopyridine (PYRIDIUM) 100 MG tablet Take 1 tablet (100 mg total) by mouth 2 (two) times daily as needed (dysuria). 10 tablet 0   No facility-administered medications prior to visit.     Per HPI unless specifically indicated in ROS section below Review of Systems  Objective:  BP 124/78   Pulse 80   Temp 97.6 F (36.4 C) (Oral)   Ht 5\' 3"  (1.6 m)   Wt 136 lb 6 oz (61.9 kg)   SpO2 99%   BMI 24.16 kg/m   Wt Readings from Last 3 Encounters:  12/27/22 136 lb 6 oz (61.9 kg)  12/09/22 133 lb (60.3 kg)  12/03/22 133 lb (60.3 kg)      Physical Exam Vitals and nursing note reviewed.  Constitutional:      Appearance: Normal appearance. She is not ill-appearing.  HENT:     Head: Normocephalic and atraumatic.     Mouth/Throat:     Mouth: Mucous membranes are dry.     Pharynx: Oropharynx is clear. No oropharyngeal exudate or posterior oropharyngeal erythema.  Eyes:     Extraocular Movements: Extraocular  movements intact.     Conjunctiva/sclera: Conjunctivae normal.     Pupils: Pupils are equal, round, and reactive to light.  Cardiovascular:     Rate and Rhythm: Normal rate and regular rhythm.     Pulses: Normal pulses.     Heart sounds: Normal heart sounds. No murmur heard. Pulmonary:     Effort: Pulmonary effort is normal. No respiratory distress.     Breath sounds: Normal breath sounds. No wheezing, rhonchi or rales.  Abdominal:     General: Bowel sounds are normal. There is no distension.     Palpations: Abdomen is soft. There is no mass.     Tenderness: There is no abdominal tenderness. There is no  guarding or rebound.     Hernia: No hernia is present.  Musculoskeletal:     Right lower leg: No edema.     Left lower leg: No edema.  Skin:    General: Skin is warm and dry.     Findings: No rash.  Neurological:     Mental Status: She is alert.  Psychiatric:        Mood and Affect: Mood normal.        Behavior: Behavior normal.       Results for orders placed or performed in visit on 12/27/22  Urine Culture   Specimen: Urine  Result Value Ref Range   MICRO NUMBER: [id number]    SPECIMEN QUALITY: Adequate    Sample Source URINE    STATUS: FINAL    Result:      Mixed genital flora isolated. These superficial bacteria are not indicative of a urinary tract infection. No further organism identification is warranted on this specimen. If clinically indicated, recollect clean-catch, mid-stream urine and transfer  immediately to Urine Culture Transport Tube.   POCT glycosylated hemoglobin (Hb A1C)  Result Value Ref Range   Hemoglobin A1C 9.3 (A) 4.0 - 5.6 %   HbA1c POC (<> result, manual entry)     HbA1c, POC (prediabetic range)     HbA1c, POC (controlled diabetic range)    POCT Urinalysis Dipstick (Automated)  Result Value Ref Range   Color, UA yellow    Clarity, UA cloudy    Glucose, UA Positive (A) Negative   Bilirubin, UA 1+    Ketones, UA negative    Spec Grav, UA  1.025 1.010 - 1.025   Blood, UA 1+    pH, UA 5.5 5.0 - 8.0   Protein, UA Positive (A) Negative   Urobilinogen, UA 0.2 0.2 or 1.0 E.U./dL   Nitrite, UA negative    Leukocytes, UA Small (1+) (A) Negative    Lab Results  Component Value Date   NA 137 12/12/2022   CL 104 12/12/2022   K 4.5 12/12/2022   CO2 23 12/12/2022   BUN 15 12/12/2022   CREATININE 1.20 (H) 12/12/2022   GFRNONAA 46 (L) 12/12/2022   CALCIUM 8.8 (L) 12/12/2022   PHOS 3.1 03/06/2022   ALBUMIN 3.1 (L) 12/12/2022   GLUCOSE 217 (H) 12/12/2022    Lab Results  Component Value Date   ALT 10 12/12/2022   AST 12 (L) 12/12/2022   ALKPHOS 55 12/12/2022   BILITOT 0.4 12/12/2022    Lab Results  Component Value Date   HGBA1C 9.3 (A) 12/27/2022    Lab Results  Component Value Date   WBC 5.8 12/12/2022   HGB 10.3 (L) 12/12/2022   HCT 31.6 (L) 12/12/2022   MCV 93.5 12/12/2022   PLT 177 12/12/2022   Lab Results  Component Value Date   IRON 43 04/02/2022   TIBC 330.4 04/02/2022   FERRITIN 11.1 04/02/2022    Assessment & Plan:   Problem List Items Addressed This Visit     Chronic kidney disease, stage 3a ([name]) (Chronic)    Latest GFR 46.       Relevant Orders   Ambulatory referral to Urology   MDD (major depressive disorder), recurrent episode, moderate ([name])    She continues duloxetine 60mg  daily.       Type 2 diabetes mellitus with neurological complications ([name])    Update A1c back on metformin and glipizide BID.  She has previously declined daily insulin injections.  Relevant Orders   POCT glycosylated hemoglobin (Hb A1C) (Completed)   S/P CABG x 3   History of cerebrovascular accident (CVA) in adulthood    Continues plavix, eliquis.       Nausea without vomiting    Ondansetron refilled.       GERD (gastroesophageal reflux disease)    Continues PPI BID       Relevant Medications   ondansetron (ZOFRAN) 4 MG tablet   Type 2 diabetes mellitus with mild nonproliferative diabetic  retinopathy without macular edema, bilateral ([name])   Atrial flutter with rapid ventricular response ([name])    Continue eliquis, plavix.       Memory deficit    Formal geriatric assessment with MMSE next visit.       Chronic anemia    Continued anemia noted, not on oral iron replacement.  Ferritin low last check 03/2022 - consider starting oral iron.  B12 low last check 08/2022 - consider starting oral b12 replacement      Recurrent UTI - Primary    UA remains grossly abnormal. Await culture after completing treatment given ongoing dysuria.  Has [facility] appt 02/2023. Has had 2 hospitalizations this past year for symptomatic UTI including this one. Will try to expedite urology evaluation.   Recent UTIs: 02/2022: >100k Klebsiella, 60k E coli treated with ceftriaxone in hospital 03/2022: >100k enterobacter treated with cipro 07/2022: hospitalization for presumed UTI/dehydration although UCx negative - treated with cefepime --> oral keflex 08/2022: Klebsiella and Enterobacter UTI treated with macrobid bid 5d and ciprofloxacin 250mg  bid 5d course - referred to urology 11/2022: Klebsiella UTI s/p hospitalization, treated with cetriaxone -> cefadroxil      Relevant Medications   phenazopyridine (PYRIDIUM) 100 MG tablet   Other Relevant Orders   Ambulatory referral to Urology   Other Visit Diagnoses     Encounter for immunization       Relevant Orders   Flu Vaccine Trivalent High Dose (Fluad) (Completed)   Dysuria       Relevant Orders   POCT Urinalysis Dipstick (Automated) (Completed)   Urine Culture (Completed)        Meds ordered this encounter  Medications   ondansetron (ZOFRAN) 4 MG tablet    Sig: Take 1 tablet (4 mg total) by mouth every 8 (eight) hours as needed for nausea or vomiting.    Dispense:  20 tablet    Refill:  0   phenazopyridine (PYRIDIUM) 100 MG tablet    Sig: Take 1 tablet (100 mg total) by mouth 2 (two) times daily as needed (dysuria).    Dispense:   20 tablet    Refill:  0    Orders Placed This Encounter  Procedures   Urine Culture   Flu Vaccine Trivalent High Dose (Fluad)   Ambulatory referral to Urology    Referral Priority:   Urgent    Referral Type:   Consultation    Referral Reason:   Specialty Services Required    Requested Specialty:   Urology    Number of Visits Requested:   1   POCT glycosylated hemoglobin (Hb A1C)   POCT Urinalysis Dipstick (Automated)    Patient Instructions  Flu shot today  A1c today  Call Dr Laruth Bouchard office for eye drop refills.  You can check at [employer] about mail services.  Urinalysis today with culture to evaluate for ongoing infection. Keep urology appointment for 02/2023.  Keep appointment with me 03/2023.   Follow up plan: Return if symptoms  worsen or fail to improve.  Eustaquio Boyden, MD

## 2022-12-27 NOTE — Telephone Encounter (Signed)
ERx 

## 2022-12-27 NOTE — Patient Instructions (Addendum)
Flu shot today  A1c today  Call Dr Laruth Bouchard office for eye drop refills.  You can check at [employer] about mail services.  Urinalysis today with culture to evaluate for ongoing infection. Keep urology appointment for 02/2023.  Keep appointment with me 03/2023.

## 2022-12-28 LAB — URINE CULTURE
MICRO NUMBER:: 15613811
SPECIMEN QUALITY:: ADEQUATE

## 2022-12-28 NOTE — Assessment & Plan Note (Signed)
Latest GFR 46.

## 2022-12-28 NOTE — Assessment & Plan Note (Signed)
She continues duloxetine 60mg  daily.

## 2022-12-28 NOTE — Assessment & Plan Note (Addendum)
Update A1c back on metformin and glipizide BID.  She has previously declined daily insulin injections.

## 2022-12-30 ENCOUNTER — Encounter: Payer: Self-pay | Admitting: Family Medicine

## 2022-12-30 NOTE — Assessment & Plan Note (Addendum)
Continued anemia noted, not on oral iron replacement.  Ferritin low last check 03/2022 - consider starting oral iron.  B12 low last check 08/2022 - consider starting oral b12 replacement

## 2022-12-30 NOTE — Assessment & Plan Note (Signed)
Continues PPI BID.  

## 2022-12-30 NOTE — Assessment & Plan Note (Signed)
Continues plavix, eliquis.

## 2022-12-30 NOTE — Assessment & Plan Note (Addendum)
UA remains grossly abnormal. Await culture after completing treatment given ongoing dysuria.  Has [facility] appt 02/2023. Has had 2 hospitalizations this past year for symptomatic UTI including this one. Will try to expedite urology evaluation.   Recent UTIs: 02/2022: >100k Klebsiella, 60k E coli treated with ceftriaxone in hospital 03/2022: >100k enterobacter treated with cipro 07/2022: hospitalization for presumed UTI/dehydration although UCx negative - treated with cefepime --> oral keflex 08/2022: Klebsiella and Enterobacter UTI treated with macrobid bid 5d and ciprofloxacin 250mg  bid 5d course - referred to urology 11/2022: Klebsiella UTI s/p hospitalization, treated with cetriaxone -> cefadroxil

## 2022-12-30 NOTE — Assessment & Plan Note (Addendum)
Formal geriatric assessment with MMSE next visit.

## 2022-12-30 NOTE — Assessment & Plan Note (Signed)
Ondansetron refilled

## 2022-12-30 NOTE — Assessment & Plan Note (Signed)
Continue eliquis, plavix.

## 2023-01-01 ENCOUNTER — Encounter: Payer: Self-pay | Admitting: *Deleted

## 2023-01-01 ENCOUNTER — Telehealth: Payer: Self-pay | Admitting: *Deleted

## 2023-01-01 ENCOUNTER — Telehealth: Payer: Self-pay

## 2023-01-01 NOTE — Patient Outreach (Signed)
Care Management  Transitions of Care Program Transitions of Care Post-discharge week 3   01/01/2023 Name: Maria Fox MRN: [id number] DOB: 1945/09/05  Subjective: Maria Fox is a 77 y.o. year old female who is a primary care patient of Eustaquio Boyden, MD. The Care Management team Engaged with patient Engaged with patient by telephone to assess and address transitions of care needs.   Consent to Services:  Patient was given information about care management services, agreed to services, and gave verbal consent to participate.   Assessment:  Per patient she is doing much better,    Patient has received all her medications except her eye drops. She has some left in the bottle and will contact company to get ordered.  Patient is taking her medication as ordered and deciding on a different pill packaging system.       SDOH Interventions    Flowsheet Row Office Visit from 12/27/2022 in [facility] [place] Telephone from 12/16/2022 in [facility] Clinical Support from 12/03/2022 in [facility] Coordination from 09/10/2022 in [facility] Office Visit from 08/20/2022 in [facility] [place] Telephone from 08/06/2022 in [facility]  SDOH Interventions        Food Insecurity Interventions -- Intervention Not Indicated Intervention Not Indicated Intervention Not Indicated -- Intervention Not Indicated  Housing Interventions -- Intervention Not Indicated Intervention Not Indicated Intervention Not Indicated -- Intervention Not Indicated  Transportation Interventions -- Intervention Not Indicated, Patient Resources Dietitian) Intervention Not Indicated Intervention Not Indicated -- Intervention Not Indicated  Utilities Interventions -- -- Intervention Not Indicated -- -- --  Alcohol  Usage Interventions -- -- Intervention Not Indicated (Score <7) -- -- --  Depression Interventions/Treatment  Medication, Currently on Treatment -- -- -- Medication, Currently on Treatment --  Financial Strain Interventions -- -- Intervention Not Indicated -- -- --  Physical Activity Interventions -- -- Intervention Not Indicated -- -- --  Stress Interventions -- -- Intervention Not Indicated -- -- --  Social Connections Interventions -- -- Intervention Not Indicated -- -- --  Health Literacy Interventions -- -- Intervention Not Indicated -- -- --        Goals Addressed   None     Plan: Telephone follow up appointment with care management team member scheduled for: Gean Maidens 01/07/2023 10:30  Gean Maidens BSN RN [facility] [phone]

## 2023-01-01 NOTE — Telephone Encounter (Signed)
Attempted to contact pt. No answer, vm box full. Need to relay lab results, Dr Timoteo Expose message and get an answer to his question. (see Labs, Result Notes- 12/27/22).  Labs/Dr G's msg: Your A1c returned elevated at 9.3. I really do think you need daily insulin shots for optimal control of your diabetes. Are you willing to give this a try?  Your urine was abnormal but the urine culture returned negative for infection. However, given your history of recurrent UTIs, I am trying to see if we can get you seen sooner than December by urology. Expect a call from [place] to schedule this.

## 2023-01-02 ENCOUNTER — Other Ambulatory Visit: Payer: 59

## 2023-01-02 ENCOUNTER — Other Ambulatory Visit: Payer: Self-pay | Admitting: Family Medicine

## 2023-01-02 ENCOUNTER — Encounter: Payer: Self-pay | Admitting: Pharmacist

## 2023-01-02 NOTE — Progress Notes (Signed)
Attempted to contact patient for scheduled appointment for medication management. Voicemail is full, unable to leave a message. Does not have MyChart.   Loree Fee, PharmD Clinical Pharmacist [facility] [phone]

## 2023-01-02 NOTE — Telephone Encounter (Signed)
Attempted to contact pt. No answer, vm box full. Need to relay lab results, Dr Timoteo Expose message and get an answer to his question. (see Labs, Result Notes- 12/27/22).  Labs/Dr G's msg: Your A1c returned elevated at 9.3. I really do think you need daily insulin shots for optimal control of your diabetes. Are you willing to give this a try?  Your urine was abnormal but the urine culture returned negative for infection. However, given your history of recurrent UTIs, I am trying to see if we can get you seen sooner than December by urology. Expect a call from [place] to schedule this.

## 2023-01-02 NOTE — Progress Notes (Deleted)
    Patient states she needs assistance with obtaining a new pharmacy that will fill her prescriptions in the blister packs. She reports speaking with a nurse this morning who provided her several pharmacies that could do blister pack medications fills. She states she is still somewhat confused with what she needs to do.   All patient would have to do is call

## 2023-01-03 NOTE — Telephone Encounter (Signed)
Attempted to contact pt. No answer, vm box full. Need to relay lab results, Dr Timoteo Expose message and get an answer to his question. (see Labs, Result Notes- 12/27/22).  Labs/Dr G's msg: Your A1c returned elevated at 9.3. I really do think you need daily insulin shots for optimal control of your diabetes. Are you willing to give this a try?  Your urine was abnormal but the urine culture returned negative for infection. However, given your history of recurrent UTIs, I am trying to see if we can get you seen sooner than December by urology. Expect a call from [place] to schedule this.  Mailing a letter.

## 2023-01-07 ENCOUNTER — Ambulatory Visit: Payer: Self-pay | Admitting: *Deleted

## 2023-01-07 NOTE — Patient Outreach (Signed)
Care Management  Transitions of Care Program Transitions of Care Post-discharge week 4  01/07/2023 Name: Maria Fox MRN: [id number] DOB: 23-Oct-1945  Subjective: Maria Fox is a 77 y.o. year old female who is a primary care patient of Eustaquio Boyden, MD. The Care Management team was unable to reach the patient by phone to assess and address transitions of care needs.   Plan: No further outreach attempts will be made at this time.  We have been unable to reach the patient.  Next outreach call 01/24/2023 By [name].   Gean Maidens BSN RN [facility] [phone]

## 2023-01-08 ENCOUNTER — Ambulatory Visit (INDEPENDENT_AMBULATORY_CARE_PROVIDER_SITE_OTHER): Payer: 59 | Admitting: Podiatry

## 2023-01-08 DIAGNOSIS — M79674 Pain in right toe(s): Secondary | ICD-10-CM

## 2023-01-08 DIAGNOSIS — M79675 Pain in left toe(s): Secondary | ICD-10-CM

## 2023-01-08 DIAGNOSIS — B351 Tinea unguium: Secondary | ICD-10-CM | POA: Diagnosis not present

## 2023-01-17 NOTE — Progress Notes (Signed)
Chief Complaint  Patient presents with   Nail Problem    "DOING WELL NO PROBLEMS"    SUBJECTIVE Patient presents to office today complaining of elongated, thickened nails that cause pain while ambulating in shoes.  Patient is unable to trim their own nails. Patient is here for further evaluation and treatment.  Past Medical History:  Diagnosis Date   Acute cholecystitis 07/07/2019   Acute deep vein thrombosis (DVT) of left tibial vein ([name]) 07/11/2019   Acute left PCA stroke ([name]) 07/13/2015   Angioedema    Atrial flutter ([name])    Autoimmune deficiency syndrome ([name])    CAD (coronary artery disease), native coronary artery 06/2014   Chronic diastolic CHF (congestive heart failure) ([name]) 08/27/2014   Chronic kidney disease, stage 3b ([name])    Closed fracture of maxilla ([name])    COVID-19 virus infection 03/2020   CVA (cerebral infarction) 07/2014   bilateral corona radiata - periCABG   Dermatitis    eval [name] 2011: eczema, eval [name] 2011: bx negative for lichen simplex or derm herpetiformis   DM (diabetes mellitus), type 2, uncontrolled w/neurologic complication 06/02/2012   ?autonomic neuropathy, gastroparesis (06/2014)    Epidermal cyst of neck 03/25/2017   Excised by derm Diona Browner)   HCAP (healthcare-associated pneumonia) 06/2014   History of chicken pox    HLD (hyperlipidemia)    Hx of migraines    remote   Hypertension    Lobar pneumonia ([name]) 03/02/2022   Maxillary fracture ([name])    Mitral regurgitation    Multiple allergies    mold, wool, dust, feathers   NSVT (nonsustained ventricular tachycardia) ([name])    Orthostatic hypotension 07/2015   Pleural effusion, left    RBBB    S/P lens implant    left side ([name])   Tricuspid regurgitation    UTI (urinary tract infection) 06/2014   Vitiligo     Allergies  Allergen Reactions   Bee Venom Anaphylaxis, Swelling and Other (See Comments)    Throat swelling   Mushroom Extract Complex Anaphylaxis   Penicillins  Anaphylaxis, Hives and Swelling    Tolerates cephalosporins including cephalexin multiple times.  Has patient had a PCN reaction causing immediate rash, facial/tongue/throat swelling, SOB or lightheadedness with hypotension: Yes Has patient had a PCN reaction causing severe rash involving mucus membranes or skin necrosis: Yes Has patient had a PCN reaction that required hospitalization Yes Has patient had a PCN reaction occurring within the last 10 years: No     Codeine Nausea And Vomiting   Sulfa Antibiotics Nausea And Vomiting   Iohexol Itching and Swelling   Erythromycin Base Rash     OBJECTIVE General Patient is awake, alert, and oriented x 3 and in no acute distress. Derm Skin is dry and supple bilateral. Negative open lesions or macerations. Remaining integument unremarkable. Nails are tender, long, thickened and dystrophic with subungual debris, consistent with onychomycosis, 1-5 bilateral. No signs of infection noted. Vasc  DP and PT pedal pulses palpable bilaterally. Temperature gradient within normal limits.  Neuro Epicritic and protective threshold sensation grossly intact bilaterally.  Musculoskeletal Exam No symptomatic pedal deformities noted bilateral. Muscular strength within normal limits.  ASSESSMENT 1.  Pain due to onychomycosis of toenails both  PLAN OF CARE 1. Patient evaluated today.  2. Instructed to maintain good pedal hygiene and foot care.  3. Mechanical debridement of nails 1-5 bilaterally performed using a nail nipper. Filed with dremel without incident.  4. Return to clinic in 3 mos.  Felecia Shelling, DPM [facility]  Dr. Felecia Shelling, DPM    [address] [place], [place] [address]                Office [phone]  Fax [phone]

## 2023-01-24 ENCOUNTER — Ambulatory Visit: Payer: Self-pay

## 2023-01-24 NOTE — Patient Outreach (Signed)
  Care Coordination   Follow Up Visit Note   01/24/2023 Name: Maria Fox MRN: [id number] DOB: [birth year]  Maria Fox is a 77 y.o. year old female who sees Eustaquio Boyden, MD for primary care. I spoke with  [name] by phone today.  What matters to the patients health and wellness today?  Patient states she is doing better since hospitalization however continues to have chronic UTI symptoms when urinating.  Patient states she is scheduled to see the urologist again on 02/11/23.  Patient states she has run out of Pyridium and does not have a refill.   Patient states her main concern is her urinary issues.     Goals Addressed             This Visit's Progress    Management of chronic health conditions.       Interventions Today    Flowsheet Row Most Recent Value  Chronic Disease   Chronic disease during today's visit Other  [recurring UTI's]  General Interventions   General Interventions Discussed/Reviewed General Interventions Reviewed, Doctor Visits  [evaluation of current treatment plan for recurring UTI's and patients adherence to plan as established by provider.]  Doctor Visits Discussed/Reviewed Doctor Visits Reviewed  [name] to keep follow up appointments with providers. Confirmed patient has follow up appointment scheduled with urologist.]  Education Interventions   Education Provided Provided Education  [Advised to drink fluids/ stay hydrated, empty bladder frequently, practice good hygiene, and wipe front to back to help prevent UTI's.]  Pharmacy Interventions   Pharmacy Dicussed/Reviewed Pharmacy Topics Reviewed  [message sent to patients primary care provider requesting  refill on Pyridium per patient request.]              SDOH assessments and interventions completed:  No     Care Coordination Interventions:  Yes, provided   Follow up plan: Follow up call scheduled for 02/26/23     Encounter Outcome:  Patient Visit Completed   [name]  RN,BSN,CCM [facility]  [facility], [facility] coordinator / Case Manager Phone: [phone]

## 2023-01-24 NOTE — Patient Instructions (Signed)
Visit Information  Thank you for taking time to visit with me today. Please don't hesitate to contact me if I can be of assistance to you.   Following are the goals we discussed today:   Goals Addressed             This Visit's Progress    Management of chronic health conditions.       Interventions Today    Flowsheet Row Most Recent Value  Chronic Disease   Chronic disease during today's visit Other  [recurring UTI's]  General Interventions   General Interventions Discussed/Reviewed General Interventions Reviewed, Doctor Visits  [evaluation of current treatment plan for recurring UTI's and patients adherence to plan as established by provider.]  Doctor Visits Discussed/Reviewed Doctor Visits Reviewed  [name] to keep follow up appointments with providers. Confirmed patient has follow up appointment scheduled with urologist.]  Education Interventions   Education Provided Provided Education  [Advised to drink fluids/ stay hydrated, empty bladder frequently, practice good hygiene, and wipe front to back to help prevent UTI's.]  Pharmacy Interventions   Pharmacy Dicussed/Reviewed Pharmacy Topics Reviewed  [message sent to patients primary care provider requesting  refill on Pyridium per patient request.]              Our next appointment is by telephone on 02/26/23 at 3 pm  Please call the care guide team at [phone] if you need to cancel or reschedule your appointment.   If you are experiencing a Mental Health or Behavioral Health Crisis or need someone to talk to, please call the Suicide and Crisis Lifeline: 988 call 1-800-273-TALK (toll free, 24 hour hotline)  The patient verbalized understanding of instructions, educational materials, and care plan provided today and agreed to receive a mailed copy of patient instructions, educational materials, and care plan.   [name] RN,BSN,CCM [place]  [facility], [facility] coordinator /  Case Manager Phone: [phone]

## 2023-02-24 ENCOUNTER — Encounter (HOSPITAL_COMMUNITY): Payer: Self-pay

## 2023-02-24 ENCOUNTER — Emergency Department (HOSPITAL_COMMUNITY): Payer: 59

## 2023-02-24 ENCOUNTER — Other Ambulatory Visit: Payer: Self-pay

## 2023-02-24 ENCOUNTER — Inpatient Hospital Stay (HOSPITAL_COMMUNITY)
Admission: EM | Admit: 2023-02-24 | Discharge: 2023-03-01 | DRG: 872 | Disposition: A | Discharge status: Home Health | Payer: 59 | Attending: Student | Admitting: Student

## 2023-02-24 DIAGNOSIS — E119 Type 2 diabetes mellitus without complications: Secondary | ICD-10-CM

## 2023-02-24 DIAGNOSIS — Z79899 Other long term (current) drug therapy: Secondary | ICD-10-CM

## 2023-02-24 DIAGNOSIS — Z7901 Long term (current) use of anticoagulants: Secondary | ICD-10-CM

## 2023-02-24 DIAGNOSIS — N3000 Acute cystitis without hematuria: Secondary | ICD-10-CM | POA: Diagnosis not present

## 2023-02-24 DIAGNOSIS — I130 Hypertensive heart and chronic kidney disease with heart failure and stage 1 through stage 4 chronic kidney disease, or unspecified chronic kidney disease: Secondary | ICD-10-CM | POA: Diagnosis present

## 2023-02-24 DIAGNOSIS — Z1629 Resistance to other single specified antibiotic: Secondary | ICD-10-CM | POA: Diagnosis present

## 2023-02-24 DIAGNOSIS — Z87440 Personal history of urinary (tract) infections: Secondary | ICD-10-CM

## 2023-02-24 DIAGNOSIS — Z8673 Personal history of transient ischemic attack (TIA), and cerebral infarction without residual deficits: Secondary | ICD-10-CM | POA: Diagnosis not present

## 2023-02-24 DIAGNOSIS — Z91030 Bee allergy status: Secondary | ICD-10-CM

## 2023-02-24 DIAGNOSIS — Z1152 Encounter for screening for COVID-19: Secondary | ICD-10-CM

## 2023-02-24 DIAGNOSIS — Z8249 Family history of ischemic heart disease and other diseases of the circulatory system: Secondary | ICD-10-CM | POA: Diagnosis not present

## 2023-02-24 DIAGNOSIS — I2510 Atherosclerotic heart disease of native coronary artery without angina pectoris: Secondary | ICD-10-CM | POA: Diagnosis not present

## 2023-02-24 DIAGNOSIS — N1832 Chronic kidney disease, stage 3b: Secondary | ICD-10-CM | POA: Diagnosis not present

## 2023-02-24 DIAGNOSIS — Z1611 Resistance to penicillins: Secondary | ICD-10-CM | POA: Diagnosis not present

## 2023-02-24 DIAGNOSIS — Z471 Aftercare following joint replacement surgery: Secondary | ICD-10-CM | POA: Diagnosis not present

## 2023-02-24 DIAGNOSIS — R410 Disorientation, unspecified: Secondary | ICD-10-CM | POA: Diagnosis not present

## 2023-02-24 DIAGNOSIS — R197 Diarrhea, unspecified: Secondary | ICD-10-CM | POA: Diagnosis not present

## 2023-02-24 DIAGNOSIS — I5032 Chronic diastolic (congestive) heart failure: Secondary | ICD-10-CM | POA: Diagnosis present

## 2023-02-24 DIAGNOSIS — E1122 Type 2 diabetes mellitus with diabetic chronic kidney disease: Secondary | ICD-10-CM | POA: Diagnosis present

## 2023-02-24 DIAGNOSIS — T383X5A Adverse effect of insulin and oral hypoglycemic [antidiabetic] drugs, initial encounter: Secondary | ICD-10-CM | POA: Diagnosis present

## 2023-02-24 DIAGNOSIS — N1831 Chronic kidney disease, stage 3a: Secondary | ICD-10-CM | POA: Diagnosis not present

## 2023-02-24 DIAGNOSIS — E860 Dehydration: Secondary | ICD-10-CM | POA: Diagnosis present

## 2023-02-24 DIAGNOSIS — K521 Toxic gastroenteritis and colitis: Secondary | ICD-10-CM | POA: Diagnosis present

## 2023-02-24 DIAGNOSIS — N390 Urinary tract infection, site not specified: Secondary | ICD-10-CM | POA: Diagnosis not present

## 2023-02-24 DIAGNOSIS — I10 Essential (primary) hypertension: Secondary | ICD-10-CM | POA: Diagnosis present

## 2023-02-24 DIAGNOSIS — R6510 Systemic inflammatory response syndrome (SIRS) of non-infectious origin without acute organ dysfunction: Secondary | ICD-10-CM

## 2023-02-24 DIAGNOSIS — Z794 Long term (current) use of insulin: Secondary | ICD-10-CM

## 2023-02-24 DIAGNOSIS — E1142 Type 2 diabetes mellitus with diabetic polyneuropathy: Secondary | ICD-10-CM | POA: Diagnosis present

## 2023-02-24 DIAGNOSIS — R6889 Other general symptoms and signs: Secondary | ICD-10-CM | POA: Diagnosis not present

## 2023-02-24 DIAGNOSIS — K3184 Gastroparesis: Secondary | ICD-10-CM | POA: Diagnosis present

## 2023-02-24 DIAGNOSIS — R531 Weakness: Secondary | ICD-10-CM

## 2023-02-24 DIAGNOSIS — Z881 Allergy status to other antibiotic agents status: Secondary | ICD-10-CM

## 2023-02-24 DIAGNOSIS — Z955 Presence of coronary angioplasty implant and graft: Secondary | ICD-10-CM

## 2023-02-24 DIAGNOSIS — E785 Hyperlipidemia, unspecified: Secondary | ICD-10-CM | POA: Diagnosis not present

## 2023-02-24 DIAGNOSIS — E1165 Type 2 diabetes mellitus with hyperglycemia: Secondary | ICD-10-CM | POA: Diagnosis not present

## 2023-02-24 DIAGNOSIS — I081 Rheumatic disorders of both mitral and tricuspid valves: Secondary | ICD-10-CM | POA: Diagnosis not present

## 2023-02-24 DIAGNOSIS — R0689 Other abnormalities of breathing: Secondary | ICD-10-CM | POA: Diagnosis not present

## 2023-02-24 DIAGNOSIS — Z83438 Family history of other disorder of lipoprotein metabolism and other lipidemia: Secondary | ICD-10-CM

## 2023-02-24 DIAGNOSIS — K219 Gastro-esophageal reflux disease without esophagitis: Secondary | ICD-10-CM | POA: Diagnosis present

## 2023-02-24 DIAGNOSIS — G8929 Other chronic pain: Secondary | ICD-10-CM | POA: Diagnosis not present

## 2023-02-24 DIAGNOSIS — G6289 Other specified polyneuropathies: Secondary | ICD-10-CM | POA: Diagnosis not present

## 2023-02-24 DIAGNOSIS — E872 Acidosis, unspecified: Secondary | ICD-10-CM | POA: Diagnosis not present

## 2023-02-24 DIAGNOSIS — Z91018 Allergy to other foods: Secondary | ICD-10-CM

## 2023-02-24 DIAGNOSIS — I4510 Unspecified right bundle-branch block: Secondary | ICD-10-CM | POA: Diagnosis present

## 2023-02-24 DIAGNOSIS — K529 Noninfective gastroenteritis and colitis, unspecified: Secondary | ICD-10-CM | POA: Diagnosis present

## 2023-02-24 DIAGNOSIS — G629 Polyneuropathy, unspecified: Secondary | ICD-10-CM

## 2023-02-24 DIAGNOSIS — I4892 Unspecified atrial flutter: Secondary | ICD-10-CM | POA: Diagnosis not present

## 2023-02-24 DIAGNOSIS — K5730 Diverticulosis of large intestine without perforation or abscess without bleeding: Secondary | ICD-10-CM | POA: Diagnosis not present

## 2023-02-24 DIAGNOSIS — Z885 Allergy status to narcotic agent status: Secondary | ICD-10-CM

## 2023-02-24 DIAGNOSIS — Z808 Family history of malignant neoplasm of other organs or systems: Secondary | ICD-10-CM

## 2023-02-24 DIAGNOSIS — Z882 Allergy status to sulfonamides status: Secondary | ICD-10-CM

## 2023-02-24 DIAGNOSIS — Z91041 Radiographic dye allergy status: Secondary | ICD-10-CM

## 2023-02-24 DIAGNOSIS — R1084 Generalized abdominal pain: Secondary | ICD-10-CM | POA: Diagnosis not present

## 2023-02-24 DIAGNOSIS — Z86718 Personal history of other venous thrombosis and embolism: Secondary | ICD-10-CM

## 2023-02-24 DIAGNOSIS — R109 Unspecified abdominal pain: Secondary | ICD-10-CM | POA: Diagnosis not present

## 2023-02-24 DIAGNOSIS — E1143 Type 2 diabetes mellitus with diabetic autonomic (poly)neuropathy: Secondary | ICD-10-CM | POA: Diagnosis present

## 2023-02-24 DIAGNOSIS — G9340 Encephalopathy, unspecified: Secondary | ICD-10-CM | POA: Diagnosis not present

## 2023-02-24 DIAGNOSIS — Z8616 Personal history of COVID-19: Secondary | ICD-10-CM | POA: Diagnosis not present

## 2023-02-24 DIAGNOSIS — Z880 Allergy status to penicillin: Secondary | ICD-10-CM

## 2023-02-24 DIAGNOSIS — A4159 Other Gram-negative sepsis: Principal | ICD-10-CM | POA: Diagnosis present

## 2023-02-24 DIAGNOSIS — Z833 Family history of diabetes mellitus: Secondary | ICD-10-CM

## 2023-02-24 DIAGNOSIS — Z7902 Long term (current) use of antithrombotics/antiplatelets: Secondary | ICD-10-CM

## 2023-02-24 DIAGNOSIS — J9811 Atelectasis: Secondary | ICD-10-CM | POA: Diagnosis not present

## 2023-02-24 DIAGNOSIS — Z800 Family history of malignant neoplasm of digestive organs: Secondary | ICD-10-CM

## 2023-02-24 DIAGNOSIS — Z7984 Long term (current) use of oral hypoglycemic drugs: Secondary | ICD-10-CM

## 2023-02-24 DIAGNOSIS — F411 Generalized anxiety disorder: Secondary | ICD-10-CM | POA: Diagnosis not present

## 2023-02-24 DIAGNOSIS — Z825 Family history of asthma and other chronic lower respiratory diseases: Secondary | ICD-10-CM

## 2023-02-24 DIAGNOSIS — Z8049 Family history of malignant neoplasm of other genital organs: Secondary | ICD-10-CM

## 2023-02-24 DIAGNOSIS — R519 Headache, unspecified: Secondary | ICD-10-CM | POA: Diagnosis not present

## 2023-02-24 DIAGNOSIS — Z951 Presence of aortocoronary bypass graft: Secondary | ICD-10-CM

## 2023-02-24 LAB — RESP PANEL BY RT-PCR (RSV, FLU A&B, COVID)  RVPGX2
Influenza A by PCR: NEGATIVE
Influenza B by PCR: NEGATIVE
Resp Syncytial Virus by PCR: NEGATIVE
SARS Coronavirus 2 by RT PCR: NEGATIVE

## 2023-02-24 LAB — COMPREHENSIVE METABOLIC PANEL
ALT: 12 U/L (ref 0–44)
AST: 21 U/L (ref 15–41)
Albumin: 3.8 g/dL (ref 3.5–5.0)
Alkaline Phosphatase: 60 U/L (ref 38–126)
Anion gap: 13 (ref 5–15)
BUN: 22 mg/dL (ref 8–23)
CO2: 18 mmol/L — ABNORMAL LOW (ref 22–32)
Calcium: 9.2 mg/dL (ref 8.9–10.3)
Chloride: 107 mmol/L (ref 98–111)
Creatinine, Ser: 1.47 mg/dL — ABNORMAL HIGH (ref 0.44–1.00)
GFR, Estimated: 36 mL/min — ABNORMAL LOW (ref >60)
Glucose, Bld: 182 mg/dL — ABNORMAL HIGH (ref 70–99)
Potassium: 3.9 mmol/L (ref 3.5–5.1)
Sodium: 138 mmol/L (ref 135–145)
Total Bilirubin: 1 mg/dL (ref <1.2)
Total Protein: 7.4 g/dL (ref 6.5–8.1)

## 2023-02-24 LAB — URINALYSIS, W/ REFLEX TO CULTURE (INFECTION SUSPECTED)
Bilirubin Urine: NEGATIVE
Glucose, UA: NEGATIVE mg/dL
Ketones, ur: 5 mg/dL — AB
Nitrite: POSITIVE — AB
Protein, ur: 100 mg/dL — AB
RBC / HPF: >50 RBC/hpf (ref 0–5)
Specific Gravity, Urine: 1.017 (ref 1.005–1.030)
WBC, UA: >50 WBC/hpf (ref 0–5)
pH: 5 (ref 5.0–8.0)

## 2023-02-24 LAB — CBC WITH DIFFERENTIAL/PLATELET
Abs Immature Granulocytes: 0.06 10*3/uL (ref 0.00–0.07)
Basophils Absolute: 0.1 10*3/uL (ref 0.0–0.1)
Basophils Relative: 0 %
Eosinophils Absolute: 0.1 10*3/uL (ref 0.0–0.5)
Eosinophils Relative: 1 %
HCT: 37.2 % (ref 36.0–46.0)
Hemoglobin: 12.7 g/dL (ref 12.0–15.0)
Immature Granulocytes: 1 %
Lymphocytes Relative: 21 %
Lymphs Abs: 2.4 10*3/uL (ref 0.7–4.0)
MCH: 30.1 pg (ref 26.0–34.0)
MCHC: 34.1 g/dL (ref 30.0–36.0)
MCV: 88.2 fL (ref 80.0–100.0)
Monocytes Absolute: 0.8 10*3/uL (ref 0.1–1.0)
Monocytes Relative: 7 %
Neutro Abs: 8.1 10*3/uL — ABNORMAL HIGH (ref 1.7–7.7)
Neutrophils Relative %: 70 %
Platelets: 279 10*3/uL (ref 150–400)
RBC: 4.22 MIL/uL (ref 3.87–5.11)
RDW: 13 % (ref 11.5–15.5)
WBC: 11.5 10*3/uL — ABNORMAL HIGH (ref 4.0–10.5)
nRBC: 0 % (ref 0.0–0.2)

## 2023-02-24 LAB — LIPASE, BLOOD: Lipase: 29 U/L (ref 11–51)

## 2023-02-24 LAB — MAGNESIUM: Magnesium: 1.4 mg/dL — ABNORMAL LOW (ref 1.7–2.4)

## 2023-02-24 MED ORDER — ATORVASTATIN CALCIUM 40 MG PO TABS
40.0000 mg | ORAL_TABLET | Freq: Every day | ORAL | Status: DC
  Administered 2023-02-25 – 2023-03-01 (×5): 40 mg via ORAL
  Filled 2023-02-24 (×5): qty 1

## 2023-02-24 MED ORDER — ONDANSETRON HCL 4 MG/2ML IJ SOLN: 4.0000 mg | Freq: Four times a day (QID) | INTRAMUSCULAR | Status: DC | PRN

## 2023-02-24 MED ORDER — ACETAMINOPHEN 650 MG RE SUPP: 650.0000 mg | Freq: Four times a day (QID) | RECTAL | Status: DC | PRN

## 2023-02-24 MED ORDER — ENOXAPARIN SODIUM 40 MG/0.4ML IJ SOSY: 40.0000 mg | PREFILLED_SYRINGE | INTRAMUSCULAR | Status: DC

## 2023-02-24 MED ORDER — ONDANSETRON HCL 4 MG PO TABS
4.0000 mg | ORAL_TABLET | Freq: Four times a day (QID) | ORAL | Status: DC | PRN
  Administered 2023-03-01: 4 mg via ORAL
  Filled 2023-02-24: qty 1

## 2023-02-24 MED ORDER — ISOSORBIDE MONONITRATE ER 30 MG PO TB24
15.0000 mg | ORAL_TABLET | Freq: Every day | ORAL | Status: DC
  Administered 2023-02-25 – 2023-03-01 (×5): 15 mg via ORAL
  Filled 2023-02-24 (×6): qty 1

## 2023-02-24 MED ORDER — INSULIN ASPART 100 UNIT/ML IJ SOLN
0.0000 [IU] | Freq: Every day | INTRAMUSCULAR | Status: DC
  Administered 2023-02-25: 3 [IU] via SUBCUTANEOUS
  Administered 2023-02-28: 2 [IU] via SUBCUTANEOUS
  Filled 2023-02-24: qty 0.05

## 2023-02-24 MED ORDER — SODIUM CHLORIDE 0.9 % IV SOLN: INTRAVENOUS | Status: AC

## 2023-02-24 MED ORDER — PANTOPRAZOLE SODIUM 40 MG PO TBEC
40.0000 mg | DELAYED_RELEASE_TABLET | Freq: Two times a day (BID) | ORAL | Status: DC
  Administered 2023-02-25 – 2023-03-01 (×9): 40 mg via ORAL
  Filled 2023-02-24 (×9): qty 1

## 2023-02-24 MED ORDER — APIXABAN 5 MG PO TABS
5.0000 mg | ORAL_TABLET | Freq: Two times a day (BID) | ORAL | Status: DC
  Administered 2023-02-25 – 2023-03-01 (×10): 5 mg via ORAL
  Filled 2023-02-24 (×10): qty 1

## 2023-02-24 MED ORDER — ONDANSETRON HCL 4 MG/2ML IJ SOLN
4.0000 mg | Freq: Once | INTRAMUSCULAR | Status: AC
Start: 2023-02-24 — End: 2023-02-24
  Administered 2023-02-24: 4 mg via INTRAVENOUS
  Filled 2023-02-24: qty 2

## 2023-02-24 MED ORDER — CEFTRIAXONE SODIUM 1 G IJ SOLR
1.0000 g | Freq: Once | INTRAMUSCULAR | Status: AC
  Administered 2023-02-24: 1 g via INTRAVENOUS
  Filled 2023-02-24: qty 10

## 2023-02-24 MED ORDER — INSULIN ASPART 100 UNIT/ML IJ SOLN
0.0000 [IU] | Freq: Three times a day (TID) | INTRAMUSCULAR | Status: DC
  Administered 2023-02-25 (×2): 3 [IU] via SUBCUTANEOUS
  Filled 2023-02-24: qty 0.09

## 2023-02-24 MED ORDER — NITROGLYCERIN 0.4 MG SL SUBL: 0.4000 mg | SUBLINGUAL_TABLET | SUBLINGUAL | Status: DC | PRN

## 2023-02-24 MED ORDER — TRAMADOL HCL 50 MG PO TABS
50.0000 mg | ORAL_TABLET | Freq: Four times a day (QID) | ORAL | Status: DC | PRN
  Administered 2023-02-26: 50 mg via ORAL
  Filled 2023-02-24: qty 1

## 2023-02-24 MED ORDER — MORPHINE SULFATE (PF) 2 MG/ML IV SOLN: 2.0000 mg | INTRAVENOUS | Status: DC | PRN

## 2023-02-24 MED ORDER — LACTATED RINGERS IV BOLUS
1000.0000 mL | Freq: Once | INTRAVENOUS | Status: AC
  Administered 2023-02-24: 1000 mL via INTRAVENOUS

## 2023-02-24 MED ORDER — METOPROLOL SUCCINATE ER 25 MG PO TB24
25.0000 mg | ORAL_TABLET | Freq: Every day | ORAL | Status: DC
  Administered 2023-02-25 – 2023-03-01 (×5): 25 mg via ORAL
  Filled 2023-02-24 (×5): qty 1

## 2023-02-24 MED ORDER — GABAPENTIN 300 MG PO CAPS
300.0000 mg | ORAL_CAPSULE | Freq: Two times a day (BID) | ORAL | Status: DC
  Administered 2023-02-25 – 2023-03-01 (×10): 300 mg via ORAL
  Filled 2023-02-24 (×10): qty 1

## 2023-02-24 MED ORDER — ACETAMINOPHEN 325 MG PO TABS
650.0000 mg | ORAL_TABLET | Freq: Four times a day (QID) | ORAL | Status: DC | PRN
  Administered 2023-02-26 – 2023-03-01 (×6): 650 mg via ORAL
  Filled 2023-02-24 (×6): qty 2

## 2023-02-24 MED ORDER — MAGNESIUM OXIDE -MG SUPPLEMENT 400 (240 MG) MG PO TABS
400.0000 mg | ORAL_TABLET | Freq: Once | ORAL | Status: AC
  Administered 2023-02-24: 400 mg via ORAL
  Filled 2023-02-24: qty 1

## 2023-02-24 MED ORDER — DULOXETINE HCL 30 MG PO CPEP
60.0000 mg | ORAL_CAPSULE | Freq: Every day | ORAL | Status: DC
  Administered 2023-02-25 – 2023-03-01 (×5): 60 mg via ORAL
  Filled 2023-02-24 (×5): qty 2

## 2023-02-24 MED ORDER — MORPHINE SULFATE (PF) 4 MG/ML IV SOLN
4.0000 mg | Freq: Once | INTRAVENOUS | Status: AC
  Administered 2023-02-24: 4 mg via INTRAVENOUS
  Filled 2023-02-24: qty 1

## 2023-02-24 NOTE — Assessment & Plan Note (Signed)
Chronic anticoagulation Patient currently in sinus rhythm Continue metoprolol and apixaban

## 2023-02-24 NOTE — Assessment & Plan Note (Signed)
Sliding scale insulin coverage hold home oral hypoglycemics for now

## 2023-02-24 NOTE — Assessment & Plan Note (Signed)
-

## 2023-02-24 NOTE — Assessment & Plan Note (Signed)
--   Continue gabapentin and duloxetine.

## 2023-02-24 NOTE — ED Triage Notes (Signed)
Pt called for abdominal pain and weakness. EMS called possible sepsis. UTI like symptoms for an extended period of time. HR 90-110 and RR 25. EMS found her sitting outside in her car saying it was too hot in the house. CBG 209

## 2023-02-24 NOTE — Assessment & Plan Note (Signed)
Secondary to diarrhea Received oral repletion in the ED Monitor and correct

## 2023-02-24 NOTE — Assessment & Plan Note (Addendum)
Continue metoprolol, Imdur, atorvastatin, apixaban and nitroglycerin sublingual as needed Also takes Plavix , so will continue

## 2023-02-24 NOTE — Assessment & Plan Note (Addendum)
SIRS, less likely sepsis History of recurrent UTI (Klebsiella 08/2022, 12/2022) SIRS criteria include tachycardia and tachypnea and leukocytosis Continue Rocephin Pyridium for symptomatic relief Follow cultures

## 2023-02-24 NOTE — Assessment & Plan Note (Signed)
 Renal function at baseline

## 2023-02-24 NOTE — Assessment & Plan Note (Signed)
Continue metoprolol. 

## 2023-02-24 NOTE — ED Provider Notes (Signed)
[place] EMERGENCY DEPARTMENT AT [facility] Provider Note   CSN: [id number] Arrival date & time: 02/24/23  1613     History  Chief Complaint  Patient presents with   Abdominal Pain   Weakness    Maria Fox is a 77 y.o. female with PMH as listed below who presents with watery diarrhea x 5 days a/w intermittent BL sharp/stabbing lower abdominal pain, radiates into lower back. C/o bottom feeling "raw" d/t lying in stool. Normally has trouble walking because her balance "is off," walks with a walker at baseline, but but has had more trouble this week because she feels very generally weak. Thinks she is having >10 bowel movements per day. A/w Nausea/dry heaving as well and decreased PO intake. Was hospitalized from 12/09/22-12/12/22 for klebsiella UTI. Also has had significant dysuria, like something stabbing her in her urethra when she urinates. Has had chills, denies fever. Also c/o SOB and nonproductive cough. No chest pain, no sick contacts.   Past Medical History:  Diagnosis Date   Acute cholecystitis 07/07/2019   Acute deep vein thrombosis (DVT) of left tibial vein ([name]) 07/11/2019   Acute left PCA stroke ([name]) 07/13/2015   Angioedema    Atrial flutter ([name])    Autoimmune deficiency syndrome ([name])    CAD (coronary artery disease), native coronary artery 06/2014   Chronic diastolic CHF (congestive heart failure) ([name]) 08/27/2014   Chronic kidney disease, stage 3b ([name])    Closed fracture of maxilla ([name])    COVID-19 virus infection 03/2020   CVA (cerebral infarction) 07/2014   bilateral corona radiata - periCABG   Dermatitis    eval [name] 2011: eczema, eval [name] 2011: bx negative for lichen simplex or derm herpetiformis   DM (diabetes mellitus), type 2, uncontrolled w/neurologic complication 06/02/2012   ?autonomic neuropathy, gastroparesis (06/2014)    Epidermal cyst of neck 03/25/2017   Excised by derm Diona Browner)   HCAP (healthcare-associated pneumonia)  06/2014   History of chicken pox    HLD (hyperlipidemia)    Hx of migraines    remote   Hypertension    Lobar pneumonia ([name]) 03/02/2022   Maxillary fracture ([name])    Mitral regurgitation    Multiple allergies    mold, wool, dust, feathers   NSVT (nonsustained ventricular tachycardia) ([name])    Orthostatic hypotension 07/2015   Pleural effusion, left    RBBB    S/P lens implant    left side ([name])   Tricuspid regurgitation    UTI (urinary tract infection) 06/2014   Vitiligo        Home Medications Prior to Admission medications   Medication Sig Start Date End Date Taking? Authorizing Provider  acyclovir ointment (ZOVIRAX) 5 % Apply 1 Application topically every 3 (three) hours as needed (for fever blisters).    [provider]  albuterol (PROAIR HFA) 108 (90 Base) MCG/ACT inhaler Inhale 2 puffs into the lungs every 6 (six) hours as needed for wheezing or shortness of breath. 05/10/20   Eustaquio Boyden, MD  apixaban (ELIQUIS) 5 MG TABS tablet Take 1 tablet (5 mg total) by mouth 2 (two) times daily. 11/08/22   Quintella Reichert, MD  atorvastatin (LIPITOR) 40 MG tablet TAKE 1 TABLET BY MOUTH EVERY EVENING 12/27/22   Eustaquio Boyden, MD  clopidogrel (PLAVIX) 75 MG tablet Take 1 tablet (75 mg total) by mouth daily. 04/17/22   Quintella Reichert, MD  dorzolamide-timolol (COSOPT) 2-0.5 % ophthalmic solution Place 1 drop into both eyes 2 (  two) times daily.    [provider]  DULoxetine (CYMBALTA) 60 MG capsule Take 1 capsule (60 mg total) by mouth daily. 04/22/22   Eustaquio Boyden, MD  gabapentin (NEURONTIN) 300 MG capsule TAKE ONE CAPSULE BY MOUTH TWICE DAILY Patient taking differently: Take 300 mg by mouth in the morning and at bedtime. 08/14/22   Eustaquio Boyden, MD  glipiZIDE (GLUCOTROL) 10 MG tablet TAKE ONE (1) TABLET BY MOUTH TWICE DAILY *REFILL REQUEST* Patient taking differently: Take 10 mg by mouth 2 (two) times daily before a meal. 11/05/22   Eustaquio Boyden, MD   isosorbide mononitrate (IMDUR) 30 MG 24 hr tablet TAKE 1/2 TABLET BY MOUTH ONCE DAILY Patient taking differently: Take 15 mg by mouth daily. 04/17/22   Turner, Cornelious Bryant, MD  latanoprost (XALATAN) 0.005 % ophthalmic solution Place 1 drop into both eyes at bedtime.  07/14/18   [provider]  metFORMIN (GLUCOPHAGE-XR) 500 MG 24 hr tablet TAKE TWO (2) TABLETS BY MOUTH EVERY MORNING 12/27/22   Eustaquio Boyden, MD  metoprolol succinate (TOPROL XL) 25 MG 24 hr tablet Take 1 tablet (25 mg total) by mouth daily. 04/05/22   Quintella Reichert, MD  nitroGLYCERIN (NITROSTAT) 0.4 MG SL tablet Place 1 tablet (0.4 mg total) under the tongue every 5 (five) minutes as needed for chest pain. 11/14/22   Quintella Reichert, MD  ondansetron (ZOFRAN) 4 MG tablet Take 1 tablet (4 mg total) by mouth every 8 (eight) hours as needed for nausea or vomiting. 12/27/22   Eustaquio Boyden, MD  pantoprazole (PROTONIX) 40 MG tablet TAKE 1 TABLET BY MOUTH TWICE DAILY *REFILL REQUEST* Patient taking differently: Take 40 mg by mouth 2 (two) times daily before a meal. 11/05/22   Eustaquio Boyden, MD  phenazopyridine (PYRIDIUM) 100 MG tablet Take 1 tablet (100 mg total) by mouth 2 (two) times daily as needed (dysuria). 12/27/22   Eustaquio Boyden, MD  traMADol (ULTRAM) 50 MG tablet Take 50 mg by mouth every 6 (six) hours as needed for moderate pain (pain score 4-6).    [provider]  TYLENOL 500 MG tablet Take 500-1,000 mg by mouth every 6 (six) hours as needed for mild pain or headache.    [provider]      Allergies    Bee venom, Mushroom extract complex (do not select), Penicillins, Codeine, Sulfa antibiotics, Iohexol, and Erythromycin base    Review of Systems   Review of Systems A 10 point review of systems was performed and is negative unless otherwise reported in HPI.  Physical Exam Updated Vital Signs BP (!) 174/157   Pulse (!) 103   Temp 97.9 F (36.6 C) (Oral)   Resp 15   Ht 5\' 3"  (1.6 m)    Wt 59 kg   SpO2 100%   BMI 23.03 kg/m  Physical Exam General: Uncomfortable appearing female, lying in bed.  HEENT: PERRLA, Sclera anicteric, dry mucous membranes, trachea midline.  Cardiology: RRR, no murmurs/rubs/gallops.  Resp: Mildly dyspneic, no increased resp effort.  CTAB, no wheezes, rhonchi, crackles.  Abd: Soft, TTP BL LQs, suprapubic region. non-distended. No rebound tenderness or guarding.  GU: Deferred. MSK: No peripheral edema or signs of trauma.  Skin: warm, dry.  Back: No CVA tenderness Neuro: A&Ox4, CNs II-XII grossly intact. MAEs. Sensation grossly intact.  Psych: Normal mood and affect.   ED Results / Procedures / Treatments   Labs (all labs ordered are listed, but only abnormal results are displayed) Labs Reviewed  CBC WITH  DIFFERENTIAL/PLATELET - Abnormal; Notable for the following components:      Result Value   WBC 11.5 (*)    Neutro Abs 8.1 (*)    All other components within normal limits  COMPREHENSIVE METABOLIC PANEL - Abnormal; Notable for the following components:   CO2 18 (*)    Glucose, Bld 182 (*)    Creatinine, Ser 1.47 (*)    GFR, Estimated 36 (*)    All other components within normal limits  MAGNESIUM - Abnormal; Notable for the following components:   Magnesium 1.4 (*)    All other components within normal limits  URINALYSIS, W/ REFLEX TO CULTURE (INFECTION SUSPECTED) - Abnormal; Notable for the following components:   APPearance TURBID (*)    Hgb urine dipstick MODERATE (*)    Ketones, ur 5 (*)    Protein, ur 100 (*)    Nitrite POSITIVE (*)    Leukocytes,Ua LARGE (*)    Bacteria, UA MANY (*)    All other components within normal limits  CBG MONITORING, ED - Abnormal; Notable for the following components:   Glucose-Capillary 266 (*)    All other components within normal limits  RESP PANEL BY RT-PCR (RSV, FLU A&B, COVID)  RVPGX2  C DIFFICILE QUICK SCREEN W PCR REFLEX    GASTROINTESTINAL PANEL BY PCR, STOOL (REPLACES STOOL CULTURE)   CULTURE, BLOOD (ROUTINE X 2)  CULTURE, BLOOD (ROUTINE X 2)  LIPASE, BLOOD  MAGNESIUM  BASIC METABOLIC PANEL  CBC    EKG EKG Interpretation Date/Time:  Monday February 24 2023 21:51:57 EST Ventricular Rate:  99 PR Interval:  158 QRS Duration:  128 QT Interval:  386 QTC Calculation: 496 R Axis:   -73  Text Interpretation: Sinus rhythm RBBB and LAFB Nonspecific T abnormalities, lateral leads Confirmed by Vivi Barrack [phone]) on 02/24/2023 10:12:05 PM  Radiology CT ABDOMEN PELVIS WO CONTRAST Result Date: 02/24/2023 CLINICAL DATA:  Possible sepsis. Abdominal pain. UTI like symptoms for extended period of time. EXAM: CT ABDOMEN AND PELVIS WITHOUT CONTRAST TECHNIQUE: Multidetector CT imaging of the abdomen and pelvis was performed following the standard protocol without IV contrast. RADIATION DOSE REDUCTION: This exam was performed according to the departmental dose-optimization program which includes automated exposure control, adjustment of the mA and/or kV according to patient size and/or use of iterative reconstruction technique. COMPARISON:  CT abdomen and pelvis 07/21/2022 FINDINGS: Lower chest: No acute abnormality. Hepatobiliary: No focal liver abnormality is seen. Status post cholecystectomy. No biliary dilatation. Pancreas: Unremarkable. No pancreatic ductal dilatation or surrounding inflammatory changes. Spleen: No acute abnormality. Adrenals/Urinary Tract: Stable adrenal glands and kidneys. No urinary calculi or hydronephrosis. Unremarkable bladder. Stomach/Bowel: No bowel obstruction or bowel wall thickening. Colonic diverticulosis without diverticulitis. Stomach and appendix are within normal limits. Vascular/Lymphatic: Aortic atherosclerosis. No enlarged abdominal or pelvic lymph nodes. Reproductive: No acute abnormality. Other: No free intraperitoneal fluid or air. Musculoskeletal: No acute fracture. IMPRESSION: No acute abnormality in the abdomen or pelvis. Aortic Atherosclerosis  (ICD10-I70.0). Electronically Signed   By: Minerva Fester M.D.   On: 02/24/2023 19:14   DG Chest Portable 1 View Result Date: 02/24/2023 CLINICAL DATA:  Abdominal pain and weakness. EXAM: PORTABLE CHEST 1 VIEW COMPARISON:  March 01, 2022 FINDINGS: Multiple sternal wires and vascular clips are seen. The heart size and mediastinal contours are within normal limits. A radiopaque the first order is in place. Mild atelectasis is seen within the retrocardiac region of the left lung base. No pleural effusion or pneumothorax is identified. The visualized skeletal  structures are unremarkable. IMPRESSION: 1. Evidence of prior median sternotomy/CABG. 2. Mild left basilar atelectasis. Electronically Signed   By: Aram Candela M.D.   On: 02/24/2023 18:34    Procedures .Critical Care  Performed by: Loetta Rough, MD Authorized by: Loetta Rough, MD   Critical care provider statement:    Critical care time (minutes):  35   Critical care was necessary to treat or prevent imminent or life-threatening deterioration of the following conditions:  Sepsis and dehydration   Critical care was time spent personally by me on the following activities:  Development of treatment plan with patient or surrogate, discussions with consultants, evaluation of patient's response to treatment, examination of patient, ordering and review of laboratory studies, ordering and review of radiographic studies, ordering and performing treatments and interventions, pulse oximetry, re-evaluation of patient's condition, review of old charts and obtaining history from patient or surrogate   Care discussed with: admitting provider       Medications Ordered in ED Medications  cefTRIAXone (ROCEPHIN) 1 g in sodium chloride 0.9 % 100 mL IVPB (has no administration in time range)  ondansetron (ZOFRAN) injection 4 mg (4 mg Intravenous Given 02/24/23 1702)  morphine (PF) 4 MG/ML injection 4 mg (4 mg Intravenous Given 02/24/23 1702)   lactated ringers bolus 1,000 mL (0 mLs Intravenous Stopped 02/24/23 2105)    ED Course/ Medical Decision Making/ A&P                          Medical Decision Making Amount and/or Complexity of Data Reviewed Labs: ordered. Decision-making details documented in ED Course. Radiology: ordered. Decision-making details documented in ED Course.  Risk OTC drugs. Prescription drug management. Decision regarding hospitalization.    This patient presents to the ED for concern of abdominal pain, dehydration, diarrhea, urinary symptoms, this involves an extensive number of treatment options, and is a complaint that carries with it a high risk of complications and morbidity.  I considered the following differential and admission for this acute, potentially life threatening condition.   MDM:    Abdominal exam without peritoneal signs. No evidence of acute abdomen at this time.  She has bilateral and suprapubic lower abdominal tenderness.  Low suspicion for acute hepatobiliary disease (including acute cholecystitis or cholangitis), acute pancreatitis (neg lipase), PUD (including gastric perforation).  With her diarrhea must consider appendicitis, diverticulitis, SBO, mesenteric ischemia.  She does have a and admission in the last couple of months and is at higher risk for C. difficile, will test for C. difficile and also give stool culture.  She endorses dysuria as well as lower back pain, must consider acute cystitis versus pyelonephritis will get a cath urine sample.  Consider electrolyte derangements, renal injury due to dehydration, patient is dry on exam.   Clinical Course as of 02/24/23 2142  Mon Feb 24, 2023  1650 WBC(!): 11.5 +leukoctyosis w/ left shift [HN]  1723 Lipase: 29 neg [HN]  1723 Creatinine(!): 1.47 C/w baseline CKD [HN]  1723 CO2(!): 18 NAGMA likely d/t diarrhea [HN]  1743 Resp panel by RT-PCR (RSV, Flu A&B, Covid) Anterior Nasal Swab neg [HN]  1921 CT ABDOMEN PELVIS WO  CONTRAST No acute abnormality in the abdomen or pelvis.  Aortic Atherosclerosis (ICD10-I70.0).   [HN]  2136 Urinalysis, w/ Reflex to Culture (Infection Suspected) -Urine, Clean Catch(!) +UTI, will give IV ceftriaxone [HN]  2140 Magnesium(!): 1.4 Repelted  [HN]    Clinical Course User Index [HN] Vivi Barrack  N, MD    Labs: I Ordered, and personally interpreted labs.  The pertinent results include:  those listed above  Imaging Studies ordered: I ordered imaging studies including CXR, CT abd pelvis I independently visualized and interpreted imaging. I agree with the radiologist interpretation  Additional history obtained from chart review.   Cardiac Monitoring: The patient was maintained on a cardiac monitor.  I personally viewed and interpreted the cardiac monitored which showed an underlying rhythm of: Sinus tachycardia, normal sinus rhythm  Reevaluation: After the interventions noted above, I reevaluated the patient and found that they have :improved  Social Determinants of Health: Lives independently  Disposition: Admit to hospitalist for UTI, SIRS +, diarrhea, dehydration, gen weakness  Co morbidities that complicate the patient evaluation  Past Medical History:  Diagnosis Date   Acute cholecystitis 07/07/2019   Acute deep vein thrombosis (DVT) of left tibial vein ([name]) 07/11/2019   Acute left PCA stroke ([name]) 07/13/2015   Angioedema    Atrial flutter ([name])    Autoimmune deficiency syndrome ([name])    CAD (coronary artery disease), native coronary artery 06/2014   Chronic diastolic CHF (congestive heart failure) ([name]) 08/27/2014   Chronic kidney disease, stage 3b ([name])    Closed fracture of maxilla ([name])    COVID-19 virus infection 03/2020   CVA (cerebral infarction) 07/2014   bilateral corona radiata - periCABG   Dermatitis    eval [name] 2011: eczema, eval [name] 2011: bx negative for lichen simplex or derm herpetiformis   DM (diabetes mellitus), type 2,  uncontrolled w/neurologic complication 06/02/2012   ?autonomic neuropathy, gastroparesis (06/2014)    Epidermal cyst of neck 03/25/2017   Excised by derm Diona Browner)   HCAP (healthcare-associated pneumonia) 06/2014   History of chicken pox    HLD (hyperlipidemia)    Hx of migraines    remote   Hypertension    Lobar pneumonia ([name]) 03/02/2022   Maxillary fracture ([name])    Mitral regurgitation    Multiple allergies    mold, wool, dust, feathers   NSVT (nonsustained ventricular tachycardia) ([name])    Orthostatic hypotension 07/2015   Pleural effusion, left    RBBB    S/P lens implant    left side ([name])   Tricuspid regurgitation    UTI (urinary tract infection) 06/2014   Vitiligo      Medicines Meds ordered this encounter  Medications   ondansetron (ZOFRAN) injection 4 mg   morphine (PF) 4 MG/ML injection 4 mg   lactated ringers bolus 1,000 mL   cefTRIAXone (ROCEPHIN) 1 g in sodium chloride 0.9 % 100 mL IVPB    Antibiotic Indication::   UTI    I have reviewed the patients home medicines and have made adjustments as needed  Problem List / ED Course: Problem List Items Addressed This Visit       Genitourinary   * (Principal) Urinary tract infection - Primary   SIRS, less likely sepsis History of recurrent UTI (Klebsiella 08/2022, 12/2022) SIRS criteria include tachycardia and tachypnea and leukocytosis Continue Rocephin Follow cultures      Other Visit Diagnoses       Diarrhea, unspecified type         Weakness         Dehydration                       This note was created using dictation software, which may contain spelling or grammatical errors.    Loetta Rough,  MD 02/25/23 0007
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
## 2023-02-24 NOTE — Assessment & Plan Note (Addendum)
History of recurrent diarrhea(functional diarrhea/autonomic dysfunction per GI 2022) Stool for C. difficile and GI panel if patient is able to produce a specimen Clear liquid diet Antiemetics Last colonoscopy/endoscopy 2021: atrophy of gastric mucosa and tubular polyp

## 2023-02-25 DIAGNOSIS — I4892 Unspecified atrial flutter: Secondary | ICD-10-CM

## 2023-02-25 DIAGNOSIS — R197 Diarrhea, unspecified: Secondary | ICD-10-CM

## 2023-02-25 DIAGNOSIS — G6289 Other specified polyneuropathies: Secondary | ICD-10-CM

## 2023-02-25 DIAGNOSIS — N3000 Acute cystitis without hematuria: Secondary | ICD-10-CM | POA: Diagnosis not present

## 2023-02-25 DIAGNOSIS — F411 Generalized anxiety disorder: Secondary | ICD-10-CM

## 2023-02-25 DIAGNOSIS — R6510 Systemic inflammatory response syndrome (SIRS) of non-infectious origin without acute organ dysfunction: Secondary | ICD-10-CM

## 2023-02-25 DIAGNOSIS — I10 Essential (primary) hypertension: Secondary | ICD-10-CM

## 2023-02-25 DIAGNOSIS — Z8673 Personal history of transient ischemic attack (TIA), and cerebral infarction without residual deficits: Secondary | ICD-10-CM

## 2023-02-25 DIAGNOSIS — E119 Type 2 diabetes mellitus without complications: Secondary | ICD-10-CM

## 2023-02-25 DIAGNOSIS — N1831 Chronic kidney disease, stage 3a: Secondary | ICD-10-CM

## 2023-02-25 LAB — GLUCOSE, CAPILLARY
Glucose-Capillary: 213 mg/dL — ABNORMAL HIGH (ref 70–99)
Glucose-Capillary: 152 mg/dL — ABNORMAL HIGH (ref 70–99)
Glucose-Capillary: 142 mg/dL — ABNORMAL HIGH (ref 70–99)

## 2023-02-25 LAB — BASIC METABOLIC PANEL
Anion gap: 8 (ref 5–15)
BUN: 20 mg/dL (ref 8–23)
CO2: 21 mmol/L — ABNORMAL LOW (ref 22–32)
Calcium: 8.2 mg/dL — ABNORMAL LOW (ref 8.9–10.3)
Chloride: 107 mmol/L (ref 98–111)
Creatinine, Ser: 1.29 mg/dL — ABNORMAL HIGH (ref 0.44–1.00)
GFR, Estimated: 42 mL/min — ABNORMAL LOW (ref >60)
Glucose, Bld: 219 mg/dL — ABNORMAL HIGH (ref 70–99)
Potassium: 4 mmol/L (ref 3.5–5.1)
Sodium: 136 mmol/L (ref 135–145)

## 2023-02-25 LAB — C DIFFICILE QUICK SCREEN W PCR REFLEX
C Diff antigen: NEGATIVE
C Diff interpretation: NOT DETECTED
C Diff toxin: NEGATIVE

## 2023-02-25 LAB — CBC
HCT: 31.7 % — ABNORMAL LOW (ref 36.0–46.0)
Hemoglobin: 10.2 g/dL — ABNORMAL LOW (ref 12.0–15.0)
MCH: 29.3 pg (ref 26.0–34.0)
MCHC: 32.2 g/dL (ref 30.0–36.0)
MCV: 91.1 fL (ref 80.0–100.0)
Platelets: 186 10*3/uL (ref 150–400)
RBC: 3.48 MIL/uL — ABNORMAL LOW (ref 3.87–5.11)
RDW: 13.1 % (ref 11.5–15.5)
WBC: 5.8 10*3/uL (ref 4.0–10.5)
nRBC: 0 % (ref 0.0–0.2)

## 2023-02-25 LAB — CBG MONITORING, ED
Glucose-Capillary: 239 mg/dL — ABNORMAL HIGH (ref 70–99)
Glucose-Capillary: 266 mg/dL — ABNORMAL HIGH (ref 70–99)

## 2023-02-25 LAB — MAGNESIUM: Magnesium: 1.6 mg/dL — ABNORMAL LOW (ref 1.7–2.4)

## 2023-02-25 MED ORDER — CLOPIDOGREL BISULFATE 75 MG PO TABS
75.0000 mg | ORAL_TABLET | Freq: Every day | ORAL | Status: DC
  Administered 2023-02-25 – 2023-03-01 (×5): 75 mg via ORAL
  Filled 2023-02-25 (×5): qty 1

## 2023-02-25 MED ORDER — INSULIN ASPART 100 UNIT/ML IJ SOLN
0.0000 [IU] | Freq: Three times a day (TID) | INTRAMUSCULAR | Status: DC
  Administered 2023-02-25 – 2023-02-26 (×2): 3 [IU] via SUBCUTANEOUS
  Administered 2023-02-26 – 2023-02-27 (×3): 5 [IU] via SUBCUTANEOUS
  Administered 2023-02-27: 15 [IU] via SUBCUTANEOUS
  Administered 2023-02-27: 3 [IU] via SUBCUTANEOUS
  Administered 2023-02-28: 8 [IU] via SUBCUTANEOUS
  Administered 2023-02-28: 11 [IU] via SUBCUTANEOUS
  Administered 2023-02-28: 2 [IU] via SUBCUTANEOUS
  Administered 2023-03-01: 11 [IU] via SUBCUTANEOUS
  Administered 2023-03-01: 8 [IU] via SUBCUTANEOUS

## 2023-02-25 MED ORDER — INSULIN GLARGINE-YFGN 100 UNIT/ML ~~LOC~~ SOLN
15.0000 [IU] | Freq: Every day | SUBCUTANEOUS | Status: DC
  Administered 2023-02-25 – 2023-02-27 (×3): 15 [IU] via SUBCUTANEOUS
  Filled 2023-02-25 (×3): qty 0.15

## 2023-02-25 MED ORDER — PHENAZOPYRIDINE HCL 200 MG PO TABS
200.0000 mg | ORAL_TABLET | Freq: Once | ORAL | Status: AC
Start: 2023-02-25 — End: 2023-02-25
  Administered 2023-02-25: 200 mg via ORAL
  Filled 2023-02-25: qty 1

## 2023-02-25 MED ORDER — SODIUM CHLORIDE 0.9 % IV SOLN
1.0000 g | INTRAVENOUS | Status: DC
Start: 2023-02-25 — End: 2023-02-27
  Administered 2023-02-25 – 2023-02-26 (×2): 1 g via INTRAVENOUS
  Filled 2023-02-25 (×2): qty 10

## 2023-02-25 NOTE — Progress Notes (Signed)
PROGRESS NOTE  Maria Fox ZOX:[id number] DOB: 05-11-1945   PCP: Eustaquio Boyden, MD  Patient is from: Home.  DOA: 02/24/2023 LOS: 1  Chief complaints Chief Complaint  Patient presents with   Abdominal Pain   Weakness     Brief Narrative / Interim history: 77 year old F with PMH of CAD/CABG, atrial flutter on Eliquis, orthostatism without anemia, CKD-3A, diastolic CHF, DM-2, HLD, GERD, recurrent UTI with chronic symptoms, chronic diarrhea/abdominal pain and anxiety presenting with watery diarrhea for about 5 days, dysuria, nausea, dry heaving and generalized weakness and admitted with working diagnosis of urinary tract infection and diarrhea.  She has mild leukocytosis, tachycardia with UA strongly suggesting UTI.  CT abdomen and pelvis without acute finding.  Patient was admitted and started on IV ceftriaxone, IV fluids, antiemetics.   Subjective: Seen and examined earlier this morning.  No major events overnight of this morning.  She reports dysuria for about 6 months.  Continues to endorse watery diarrhea.  Denies melena or hematochezia.  Denies chest pain, shortness of breath or fever.  Objective: Vitals:   02/25/23 0504 02/25/23 0840 02/25/23 0848 02/25/23 1154  BP:  (!) 123/49  127/64  Pulse:  99  76  Resp:  16  18  Temp: 97.7 F (36.5 C)  98.2 F (36.8 C) 98.2 F (36.8 C)  TempSrc: Oral  Oral Oral  SpO2:  100%  97%  Weight:      Height:        Examination:  GENERAL: No apparent distress.  Nontoxic. HEENT: MMM.  Vision and hearing grossly intact.  NECK: Supple.  No apparent JVD.  RESP:  No IWOB.  Fair aeration bilaterally. CVS:  RRR. Heart sounds normal.  ABD/GI/GU: BS+. Abd soft, NTND.  MSK/EXT:  Moves extremities. No apparent deformity. No edema.  SKIN: no apparent skin lesion or wound NEURO: Sleepy but wakes to voice.  Oriented appropriately.  No apparent focal neuro deficit. PSYCH: Calm. Normal affect.   Procedures:  None  Microbiology  summarized: COVID-19, influenza and RSV PCR nonreactive Blood cultures pending Urine culture pending C. difficile negative GIP pending  Assessment and plan: Sepsis due to urinary tract infection: POA.  Has leukocytosis with tachycardia.  UA consistent with UTI.  Chronic History of recurrent UTI (Klebsiella 08/2022, 12/2022) -CT abdomen and pelvis without acute finding -Continue IV ceftriaxone pending cultures. -Continue Pyridium for symptomatic relief -Patient reports upcoming appointment with urology next month.   Acute on chronic diarrhea: Patient with history of recurrent diarrhea and autonomic dysfunction per GI in 2022.  EGD/[facility] in 2021 with atrophy of gastric mucosa no polyps.  CT abdomen and pelvis without acute finding abdominal exam benign.  C. difficile negative.  GI panel pending. -Advance diet to soft -Follow GIP.  May consider Imodium or antimotility agents if GIP negative -Continue IV fluid -May consider stopping metformin on discharge.  Paroxysmal atrial flutter: Rate controlled -Continue metoprolol and Eliquis -Optimize electrolytes   Hypomagnesemia: Likely due to diarrhea -Monitor replenish as appropriate     CAD, multiple vessel s/p CABG x 3: No cardiopulmonary symptoms. -Continue metoprolol, Imdur, atorvastatin, apixaban, Plavix and nitro   NIDDM-2 with hyperglycemia: A1c 9.3% on 10/18.  On metformin and glipizide at home. Recent Labs  Lab 02/25/23 0005 02/25/23 0833 02/25/23 1226  GLUCAP 266* 239* 213*  -Increase SSI to moderate -Add Semglee 15 units daily -Hold p.o. meds   CKD-3A: Stable -Monitor   Peripheral neuropathy -Continue gabapentin and duloxetine   History of CVA -Continue home  meds   Essential hypertension -Continue metoprolol and Imdur  Generalized weakness: Likely due to acute illness.  Lives with friend.  Uses rolling walker as needed. -PT/OT  Body mass index is 23.03 kg/m.          DVT prophylaxis:   apixaban  (ELIQUIS) tablet 5 mg  Code Status: Full code Family Communication: None at bedside Level of care: Med-Surg Status is: Inpatient Remains inpatient appropriate because: UTI, diarrhea and generalized weakness   Final disposition: Likely home once medically stable Consultants:  None  55 minutes with more than 50% spent in reviewing records, counseling patient/family and coordinating care.   Sch Meds:  Scheduled Meds:  apixaban  5 mg Oral BID   atorvastatin  40 mg Oral Daily   clopidogrel  75 mg Oral Daily   DULoxetine  60 mg Oral Daily   gabapentin  300 mg Oral BID   insulin aspart  0-15 Units Subcutaneous TID WC   insulin aspart  0-5 Units Subcutaneous QHS   insulin glargine-yfgn  15 Units Subcutaneous Daily   isosorbide mononitrate  15 mg Oral Daily   metoprolol succinate  25 mg Oral Daily   pantoprazole  40 mg Oral BID AC   Continuous Infusions:  sodium chloride 40 mL/hr at 02/25/23 1103   PRN Meds:.acetaminophen **OR** acetaminophen, morphine injection, nitroGLYCERIN, ondansetron **OR** ondansetron (ZOFRAN) IV, traMADol  Antimicrobials: Anti-infectives (From admission, onward)    Start     Dose/Rate Route Frequency Ordered Stop   02/24/23 2145  cefTRIAXone (ROCEPHIN) 1 g in sodium chloride 0.9 % 100 mL IVPB        1 g 200 mL/hr over 30 Minutes Intravenous  Once 02/24/23 2137 02/24/23 2354        I have personally reviewed the following labs and images: CBC: Recent Labs  Lab 02/24/23 1619 02/25/23 0408  WBC 11.5* 5.8  NEUTROABS 8.1*  --   HGB 12.7 10.2*  HCT 37.2 31.7*  MCV 88.2 91.1  PLT 279 186   BMP &GFR Recent Labs  Lab 02/24/23 1619 02/25/23 0408  NA 138 136  K 3.9 4.0  CL 107 107  CO2 18* 21*  GLUCOSE 182* 219*  BUN 22 20  CREATININE 1.47* 1.29*  CALCIUM 9.2 8.2*  MG 1.4* 1.6*   Estimated Creatinine Clearance: 29.3 mL/min (A) (by C-G formula based on SCr of 1.29 mg/dL (H)). Liver & Pancreas: Recent Labs  Lab 02/24/23 1619  AST 21   ALT 12  ALKPHOS 60  BILITOT 1.0  PROT 7.4  ALBUMIN 3.8   Recent Labs  Lab 02/24/23 1619  LIPASE 29   No results for input(s): "AMMONIA" in the last 168 hours. Diabetic: No results for input(s): "HGBA1C" in the last 72 hours. Recent Labs  Lab 02/25/23 0005 02/25/23 0833 02/25/23 1226  GLUCAP 266* 239* 213*   Cardiac Enzymes: No results for input(s): "CKTOTAL", "CKMB", "CKMBINDEX", "TROPONINI" in the last 168 hours. No results for input(s): "PROBNP" in the last 8760 hours. Coagulation Profile: No results for input(s): "INR", "PROTIME" in the last 168 hours. Thyroid Function Tests: No results for input(s): "TSH", "T4TOTAL", "FREET4", "T3FREE", "THYROIDAB" in the last 72 hours. Lipid Profile: No results for input(s): "CHOL", "HDL", "LDLCALC", "TRIG", "CHOLHDL", "LDLDIRECT" in the last 72 hours. Anemia Panel: No results for input(s): "VITAMINB12", "FOLATE", "FERRITIN", "TIBC", "IRON", "RETICCTPCT" in the last 72 hours. Urine analysis:    Component Value Date/Time   COLORURINE YELLOW 02/24/2023 2103   APPEARANCEUR TURBID (A) 02/24/2023 2103  LABSPEC 1.017 02/24/2023 2103   PHURINE 5.0 02/24/2023 2103   GLUCOSEU NEGATIVE 02/24/2023 2103   HGBUR MODERATE (A) 02/24/2023 2103   BILIRUBINUR NEGATIVE 02/24/2023 2103   BILIRUBINUR 1+ 12/27/2022 1555   KETONESUR 5 (A) 02/24/2023 2103   PROTEINUR 100 (A) 02/24/2023 2103   UROBILINOGEN 0.2 12/27/2022 1555   UROBILINOGEN 0.2 09/16/2014 0009   NITRITE POSITIVE (A) 02/24/2023 2103   LEUKOCYTESUR LARGE (A) 02/24/2023 2103   Sepsis Labs: Invalid input(s): "PROCALCITONIN", "LACTICIDVEN"  Microbiology: Recent Results (from the past 240 hours)  Resp panel by RT-PCR (RSV, Flu A&B, Covid) Anterior Nasal Swab     Status: None   Collection Time: 02/24/23  4:53 PM   Specimen: Anterior Nasal Swab  Result Value Ref Range Status   SARS Coronavirus 2 by RT PCR NEGATIVE NEGATIVE Final    Comment: (NOTE) SARS-CoV-2 target nucleic acids  are NOT DETECTED.  The SARS-CoV-2 RNA is generally detectable in upper respiratory specimens during the acute phase of infection. The lowest concentration of SARS-CoV-2 viral copies this assay can detect is 138 copies/mL. A negative result does not preclude SARS-Cov-2 infection and should not be used as the sole basis for treatment or other patient management decisions. A negative result may occur with  improper specimen collection/handling, submission of specimen other than nasopharyngeal swab, presence of viral mutation(s) within the areas targeted by this assay, and inadequate number of viral copies(<138 copies/mL). A negative result must be combined with clinical observations, patient history, and epidemiological information. The expected result is Negative.  Fact Sheet for Patients:  BloggerCourse.com  Fact Sheet for Healthcare Providers:  SeriousBroker.it  This test is no t yet approved or cleared by the [place] FDA and  has been authorized for detection and/or diagnosis of SARS-CoV-2 by FDA under an Emergency Use Authorization (EUA). This EUA will remain  in effect (meaning this test can be used) for the duration of the COVID-19 declaration under Section 564(b)(1) of the Act, 21 U.S.C.section 360bbb-3(b)(1), unless the authorization is terminated  or revoked sooner.       Influenza A by PCR NEGATIVE NEGATIVE Final   Influenza B by PCR NEGATIVE NEGATIVE Final    Comment: (NOTE) The Xpert Xpress SARS-CoV-2/FLU/RSV plus assay is intended as an aid in the diagnosis of influenza from Nasopharyngeal swab specimens and should not be used as a sole basis for treatment. Nasal washings and aspirates are unacceptable for Xpert Xpress SARS-CoV-2/FLU/RSV testing.  Fact Sheet for Patients: BloggerCourse.com  Fact Sheet for Healthcare Providers: SeriousBroker.it  This test is  not yet approved or cleared by the [place] FDA and has been authorized for detection and/or diagnosis of SARS-CoV-2 by FDA under an Emergency Use Authorization (EUA). This EUA will remain in effect (meaning this test can be used) for the duration of the COVID-19 declaration under Section 564(b)(1) of the Act, 21 U.S.C. section 360bbb-3(b)(1), unless the authorization is terminated or revoked.     Resp Syncytial Virus by PCR NEGATIVE NEGATIVE Final    Comment: (NOTE) Fact Sheet for Patients: BloggerCourse.com  Fact Sheet for Healthcare Providers: SeriousBroker.it  This test is not yet approved or cleared by the [place] FDA and has been authorized for detection and/or diagnosis of SARS-CoV-2 by FDA under an Emergency Use Authorization (EUA). This EUA will remain in effect (meaning this test can be used) for the duration of the COVID-19 declaration under Section 564(b)(1) of the Act, 21 U.S.C. section 360bbb-3(b)(1), unless the authorization is terminated or revoked.  Performed  at [facility], [address] [place], [place] [address]   C Difficile Quick Screen w PCR reflex     Status: None   Collection Time: 02/25/23  4:08 AM   Specimen: STOOL  Result Value Ref Range Status   C Diff antigen NEGATIVE NEGATIVE Final   C Diff toxin NEGATIVE NEGATIVE Final   C Diff interpretation No C. difficile detected.  Final    Comment: Performed at [facility], [address] [place], [place] [address]    Radiology Studies: CT ABDOMEN PELVIS WO CONTRAST Result Date: 02/24/2023 CLINICAL DATA:  Possible sepsis. Abdominal pain. UTI like symptoms for extended period of time. EXAM: CT ABDOMEN AND PELVIS WITHOUT CONTRAST TECHNIQUE: Multidetector CT imaging of the abdomen and pelvis was performed following the standard protocol without IV contrast. RADIATION DOSE REDUCTION: This exam was performed  according to the departmental dose-optimization program which includes automated exposure control, adjustment of the mA and/or kV according to patient size and/or use of iterative reconstruction technique. COMPARISON:  CT abdomen and pelvis 07/21/2022 FINDINGS: Lower chest: No acute abnormality. Hepatobiliary: No focal liver abnormality is seen. Status post cholecystectomy. No biliary dilatation. Pancreas: Unremarkable. No pancreatic ductal dilatation or surrounding inflammatory changes. Spleen: No acute abnormality. Adrenals/Urinary Tract: Stable adrenal glands and kidneys. No urinary calculi or hydronephrosis. Unremarkable bladder. Stomach/Bowel: No bowel obstruction or bowel wall thickening. Colonic diverticulosis without diverticulitis. Stomach and appendix are within normal limits. Vascular/Lymphatic: Aortic atherosclerosis. No enlarged abdominal or pelvic lymph nodes. Reproductive: No acute abnormality. Other: No free intraperitoneal fluid or air. Musculoskeletal: No acute fracture. IMPRESSION: No acute abnormality in the abdomen or pelvis. Aortic Atherosclerosis (ICD10-I70.0). Electronically Signed   By: Minerva Fester M.D.   On: 02/24/2023 19:14   DG Chest Portable 1 View Result Date: 02/24/2023 CLINICAL DATA:  Abdominal pain and weakness. EXAM: PORTABLE CHEST 1 VIEW COMPARISON:  March 01, 2022 FINDINGS: Multiple sternal wires and vascular clips are seen. The heart size and mediastinal contours are within normal limits. A radiopaque the first order is in place. Mild atelectasis is seen within the retrocardiac region of the left lung base. No pleural effusion or pneumothorax is identified. The visualized skeletal structures are unremarkable. IMPRESSION: 1. Evidence of prior median sternotomy/CABG. 2. Mild left basilar atelectasis. Electronically Signed   By: Aram Candela M.D.   On: 02/24/2023 18:34      Dasha Kawabata T. Deazia Lampi [facility]  If 7PM-7AM, please contact  night-coverage [email or web address] 02/25/2023, 2:52 PM

## 2023-02-25 NOTE — Evaluation (Signed)
Physical Therapy Evaluation Patient Details Name: Maria Fox MRN: [id number] DOB: 1945/05/29 Today's Date: 02/25/2023  History of Present Illness  Pt is 77 yo female admitted on 02/24/23 with sepsis due to UTI.  Also with acute on chronic diarrhea.  Pt with hx including but not limited to CAD/CABG, atrial flutter on Eliquis, orthostatism without anemia, CKD-3A, diastolic CHF, DM-2, HLD, GERD, recurrent UTI with chronic symptoms, chronic diarrhea/abdominal pain and anxiety  Clinical Impression  Pt admitted with above diagnosis. At baseline, pt is independent with adls and ambulation. She lives with a friend and has support as needed. Pt has a RW and cane at home but does not use.  Today, she was lethargic but arousable and ambulated in room with CGA.  Pt with mild unsteadiness needing occasional UE support.  Expected to progress well and likely no needs at d/c.  Pt currently with functional limitations due to the deficits listed below (see PT Problem List). Pt will benefit from acute skilled PT to increase their independence and safety with mobility to allow discharge.           If plan is discharge home, recommend the following: A little help with walking and/or transfers;A little help with bathing/dressing/bathroom   Can travel by private vehicle        Equipment Recommendations None recommended by PT  Recommendations for Other Services       Functional Status Assessment Patient has had a recent decline in their functional status and demonstrates the ability to make significant improvements in function in a reasonable and predictable amount of time.     Precautions / Restrictions Precautions Precautions: Fall      Mobility  Bed Mobility Overal bed mobility: Needs Assistance Bed Mobility: Supine to Sit, Sit to Supine     Supine to sit: Min assist Sit to supine: Supervision        Transfers Overall transfer level: Needs assistance Equipment used: None Transfers: Sit  to/from Stand Sit to Stand: Contact guard assist           General transfer comment: Performed x 2 wtih CGA; pt performed toielting ADLs with CGA for balance in standing    Ambulation/Gait Ambulation/Gait assistance: Contact guard assist Gait Distance (Feet): 70 Feet Assistive device: None (occasionally held furniture/wall) Gait Pattern/deviations: Step-through pattern, Drifts right/left Gait velocity: decreased     General Gait Details: mildly unsteady reaching for furniture/bed at times but otherwise CGA  Stairs            Wheelchair Mobility     Tilt Bed    Modified Rankin (Stroke Patients Only)       Balance Overall balance assessment: Needs assistance Sitting-balance support: No upper extremity supported Sitting balance-Leahy Scale: Good     Standing balance support: No upper extremity supported, Single extremity supported Standing balance-Leahy Scale: Good Standing balance comment: occasional use of UE to balance                             Pertinent Vitals/Pain Pain Assessment Pain Assessment: No/denies pain    Home Living Family/patient expects to be discharged to:: Private residence Living Arrangements: Other (Comment) Clearence Cheek [name]) Available Help at Discharge: Family;Available 24 hours/day Type of Home: House Home Access: Stairs to enter Entrance Stairs-Rails: Doctor, general practice of Steps: 2   Home Layout: One level Home Equipment: Agricultural consultant (2 wheels);Cane - single point;Tub bench      Prior Function  Prior Level of Function : Independent/Modified Independent             Mobility Comments: Pt ambulated without AD, reports could ambulate in community; per chart 1 fall in yard 3 months ago ADLs Comments: Pt reports ind with self care, [name] does the cooking and cleaning and driving     Extremity/Trunk Assessment   Upper Extremity Assessment Upper Extremity Assessment: Overall WFL for tasks  assessed    Lower Extremity Assessment Lower Extremity Assessment: Overall WFL for tasks assessed (ROM WFL; MMT 5/5)    Cervical / Trunk Assessment Cervical / Trunk Assessment: Normal  Communication      Cognition Arousal: Lethargic Behavior During Therapy: WFL for tasks assessed/performed Overall Cognitive Status: No family/caregiver present to determine baseline cognitive functioning                                 General Comments: Pt with mild confusion in regards to date but otherwise oriented and followed commands        General Comments General comments (skin integrity, edema, etc.): VSS    Exercises     Assessment/Plan    PT Assessment Patient needs continued PT services  PT Problem List Decreased strength;Decreased range of motion;Decreased activity tolerance;Decreased balance;Decreased mobility;Decreased cognition;Decreased knowledge of use of DME;Decreased safety awareness       PT Treatment Interventions DME instruction;Functional mobility training;Balance training;Patient/family education;Gait training;Therapeutic activities;Neuromuscular re-education;Stair training;Therapeutic exercise    PT Goals (Current goals can be found in the Care Plan section)  Acute Rehab PT Goals Patient Stated Goal: return home PT Goal Formulation: With patient/family Time For Goal Achievement: 03/11/23 Potential to Achieve Goals: Good    Frequency Min 1X/week     Co-evaluation               AM-PAC PT "6 Clicks" Mobility  Outcome Measure Help needed turning from your back to your side while in a flat bed without using bedrails?: None Help needed moving from lying on your back to sitting on the side of a flat bed without using bedrails?: A Little Help needed moving to and from a bed to a chair (including a wheelchair)?: A Little Help needed standing up from a chair using your arms (e.g., wheelchair or bedside chair)?: A Little Help needed to walk in  hospital room?: A Little Help needed climbing 3-5 steps with a railing? : A Little 6 Click Score: 19    End of Session Equipment Utilized During Treatment: Gait belt Activity Tolerance: Patient tolerated treatment well Patient left: in bed;with bed alarm set;with call bell/phone within reach Nurse Communication: Mobility status;Other (comment) (urinated in toilet - red/orange color, did not flush) PT Visit Diagnosis: Other abnormalities of gait and mobility (R26.89);Unsteadiness on feet (R26.81)    Time: 1610-9604 PT Time Calculation (min) (ACUTE ONLY): 13 min   Charges:   PT Evaluation $PT Eval Low Complexity: 1 Low   PT General Charges $$ ACUTE PT VISIT: 1 Visit         Anise Salvo, PT Acute Rehab [facility] [phone]   Rayetta Humphrey 02/25/2023, 4:03 PM

## 2023-02-25 NOTE — ED Notes (Signed)
Lab called and advised they did not have enough stool for the GI panel. Will try to get another sample today. They did have enough to run the C Diff however.

## 2023-02-26 ENCOUNTER — Inpatient Hospital Stay (HOSPITAL_COMMUNITY): Payer: 59

## 2023-02-26 ENCOUNTER — Ambulatory Visit: Payer: Self-pay

## 2023-02-26 DIAGNOSIS — R410 Disorientation, unspecified: Secondary | ICD-10-CM

## 2023-02-26 DIAGNOSIS — R6510 Systemic inflammatory response syndrome (SIRS) of non-infectious origin without acute organ dysfunction: Secondary | ICD-10-CM | POA: Diagnosis not present

## 2023-02-26 LAB — CBC
HCT: 31 % — ABNORMAL LOW (ref 36.0–46.0)
Hemoglobin: 9.8 g/dL — ABNORMAL LOW (ref 12.0–15.0)
MCH: 29.3 pg (ref 26.0–34.0)
MCHC: 31.6 g/dL (ref 30.0–36.0)
MCV: 92.8 fL (ref 80.0–100.0)
Platelets: 180 10*3/uL (ref 150–400)
RBC: 3.34 MIL/uL — ABNORMAL LOW (ref 3.87–5.11)
RDW: 13 % (ref 11.5–15.5)
WBC: 5.6 10*3/uL (ref 4.0–10.5)
nRBC: 0 % (ref 0.0–0.2)

## 2023-02-26 LAB — COMPREHENSIVE METABOLIC PANEL
ALT: 10 U/L (ref 0–44)
AST: 15 U/L (ref 15–41)
Albumin: 2.8 g/dL — ABNORMAL LOW (ref 3.5–5.0)
Alkaline Phosphatase: 60 U/L (ref 38–126)
Anion gap: 7 (ref 5–15)
BUN: 12 mg/dL (ref 8–23)
CO2: 20 mmol/L — ABNORMAL LOW (ref 22–32)
Calcium: 8.2 mg/dL — ABNORMAL LOW (ref 8.9–10.3)
Chloride: 108 mmol/L (ref 98–111)
Creatinine, Ser: 1.32 mg/dL — ABNORMAL HIGH (ref 0.44–1.00)
GFR, Estimated: 41 mL/min — ABNORMAL LOW (ref >60)
Glucose, Bld: 240 mg/dL — ABNORMAL HIGH (ref 70–99)
Potassium: 3.8 mmol/L (ref 3.5–5.1)
Sodium: 135 mmol/L (ref 135–145)
Total Bilirubin: 0.3 mg/dL (ref <1.2)
Total Protein: 5.8 g/dL — ABNORMAL LOW (ref 6.5–8.1)

## 2023-02-26 LAB — GLUCOSE, CAPILLARY
Glucose-Capillary: 185 mg/dL — ABNORMAL HIGH (ref 70–99)
Glucose-Capillary: 231 mg/dL — ABNORMAL HIGH (ref 70–99)
Glucose-Capillary: 208 mg/dL — ABNORMAL HIGH (ref 70–99)
Glucose-Capillary: 175 mg/dL — ABNORMAL HIGH (ref 70–99)

## 2023-02-26 LAB — MAGNESIUM: Magnesium: 1.4 mg/dL — ABNORMAL LOW (ref 1.7–2.4)

## 2023-02-26 LAB — PHOSPHORUS: Phosphorus: 3.4 mg/dL (ref 2.5–4.6)

## 2023-02-26 MED ORDER — ENSURE ENLIVE PO LIQD
237.0000 mL | Freq: Two times a day (BID) | ORAL | Status: DC
  Administered 2023-02-26 – 2023-03-01 (×6): 237 mL via ORAL

## 2023-02-26 MED ORDER — MAGNESIUM OXIDE -MG SUPPLEMENT 400 (240 MG) MG PO TABS
800.0000 mg | ORAL_TABLET | Freq: Every day | ORAL | Status: DC
  Administered 2023-02-26: 800 mg via ORAL
  Filled 2023-02-26: qty 2

## 2023-02-26 MED ORDER — SODIUM BICARBONATE 650 MG PO TABS
650.0000 mg | ORAL_TABLET | Freq: Two times a day (BID) | ORAL | Status: DC
  Administered 2023-02-26 – 2023-02-27 (×3): 650 mg via ORAL
  Filled 2023-02-26 (×3): qty 1

## 2023-02-26 NOTE — Inpatient Diabetes Management (Addendum)
Inpatient Diabetes Program Recommendations  AACE/ADA: New Consensus Statement on Inpatient Glycemic Control (2015)  Target Ranges:  Prepandial:   less than 140 mg/dL      Peak postprandial:   less than 180 mg/dL (1-2 hours)      Critically ill patients:  140 - 180 mg/dL    Latest Reference Range & Units 02/25/22 03:55 08/20/22 14:35 12/27/22 15:57  Hemoglobin A1C 4.0 - 5.6 % -  9.2 (H) 8.0 ! 9.3 ! Pend  (H): Data is abnormally high !: Data is abnormal  Latest Reference Range & Units 02/25/23 08:33 02/25/23 12:26 02/25/23 16:28 02/25/23 20:19  Glucose-Capillary 70 - 99 mg/dL 409 (H)  3 units Novolog  213 (H)  3 units Novolog  152 (H)  3 units Novolog  15 units Semglee 142 (H)  (H): Data is abnormally high  Latest Reference Range & Units 02/26/23 07:34  Glucose-Capillary 70 - 99 mg/dL 811 (H)  (H): Data is abnormally high   Admit with UTI/ SIRS, less likely sepsis/ History of recurrent UTI/ Acute diarrhea (History of recurrent diarrhea--functional diarrhea/autonomic dysfunction per GI 2022)  Home DM Meds: Glipizide 10 mg BID + Metformin 1000 QAM  Current Orders: Novolog Moderate Correction Scale/ SSI (0-15 units) TID AC + HS     Semglee 15 units daily   Note Semglee insulin started late yest afternoon  Consult received to have pt start insulin at home   Met w/ pt at bedside around 11am.  Asked pt if the MD talked with her about switching to insulin from home--Pt stated she did not remember hearing that from the doctor.  Pt told me she has been taking Metformin a long time and hasn't noticed if the Metformin has been causing diarrhea.  Discussed with pt that the MD is worried that the Metformin may be making her issues with diarrhea worse.  Pt seemed very apprehensive at first about starting insulin for home and told me she didn't want to do that, however, after I showed her how an insulin pen works she seemed more open.  She told me she is just afraid of sticking herself--I  asked her if she is allowed to practice injections here in the hospital would she reconsider and she stated "yes".   Educated patient on insulin pen use at home.  Reviewed all steps of insulin pen including attachment of needle, 2-unit air shot, dialing up dose, giving injection, rotation of injection sites, removing needle, disposal of sharps, storage of unused insulin, disposal of insulin etc.  Patient able to provide successful return demonstration.  Reviewed troubleshooting with insulin pen.  Also reviewed Signs/Symptoms of Hypoglycemia with patient and how to treat Hypoglycemia at home.  Have asked RNs caring for patient to please allow patient to give all injections here in hospital as much as possible for practice.    Based on my interaction with pt, I think we should keep the insulin regimen to once daily basal to keep it a simple regimen.  I would worry that pt could get confused with 2 different insulins and multiple doses.    Recommend for discharge: 1. Stop Glipizide and Metformin 2. Start Once daily Basal insulin for home 3. Start Tradjenta 5 mg daily (DPP-4 inhibitor)    --Will follow patient during hospitalization--  Ambrose Finland RN, MSN, CDCES Diabetes Coordinator Inpatient Glycemic Control Team Team Pager: [phone] (8a-5p)

## 2023-02-26 NOTE — Progress Notes (Signed)
   02/26/23 1400  Spiritual Encounters  Type of Visit Initial  Care provided to: Patient  Conversation partners present during encounter Nurse  Referral source Chaplain assessment  Reason for visit Advance directives  OnCall Visit No  Spiritual Framework  Presenting Themes Impactful experiences and emotions;Courage hope and growth  Community/Connection Family;Significant other  Patient Stress Factors Health changes  Family Stress Factors None identified  Interventions  Spiritual Care Interventions Made Compassionate presence;Established relationship of care and support;Encouragement;Self-care teaching  Intervention Outcomes  Outcomes Connection to spiritual care;Awareness around self/spiritual resourses;Awareness of health;Awareness of support  Spiritual Care Plan  Spiritual Care Issues Still Outstanding No further spiritual care needs at this time (see row info)   Chaplain received a spiritual consult for an AD. Patient stated that she already has an AD in place and talked to her doctor recently about it. Patient shared with the chaplain that she has been in a lot of pain and prays that God will heal her. Patient talked with chaplain about her life growing up and also her 30 year relationship with her partner. Chaplain services remain available for spiritual and emotional support when needed.

## 2023-02-26 NOTE — Consult Note (Addendum)
[facility] Liaison Consult Note    02/26/2023  Maria Fox 02-Jun-1945 [id number]   [facility] Liaison with remote review/coverage of patient admitted to [facility]  [facility] Patient: Received in basket message from [facility]. Confirm from RN [facility] of patient active in CC.  Primary Care Provider:  Eustaquio Boyden, MD with [place] at [facility], this provider is listed to provide the community transition of care follow up and TOC calls  Insurance: [employer]  Patient is currently active with [facility] for care coordination services.  Patient has been engaged by a [employer]. The community based plan of care has focused on disease management and community resource support.  Noted latest Hgb A1C 9.2.  Patient will receive a post hospital call and will be evaluated for assessments and disease process education.    Plan: Continue to follow for any additional community care coordination needs for post hospital/community needs.   Of note, [facility] services does not replace or interfere with any services that are needed or arranged by inpatient [facility] care management team.   Charlesetta Shanks, RN, BSN, CCM [place]  [facility], Population Health, [facility] Care Liaison Direct Dial: [phone] or secure chat Email: [email or web address]

## 2023-02-26 NOTE — Plan of Care (Signed)
  Problem: Education: Goal: Ability to describe self-care measures that may prevent or decrease complications (Diabetes Survival Skills Education) will improve Outcome: Progressing Goal: Individualized Educational Video(s) Outcome: Progressing   Problem: Coping: Goal: Ability to adjust to condition or change in health will improve Outcome: Progressing   Problem: Fluid Volume: Goal: Ability to maintain a balanced intake and output will improve Outcome: Progressing   Problem: Health Behavior/Discharge Planning: Goal: Ability to identify and utilize available resources and services will improve Outcome: Progressing Goal: Ability to manage health-related needs will improve Outcome: Progressing   Problem: Nutritional: Goal: Maintenance of adequate nutrition will improve Outcome: Progressing   Problem: Nutritional: Goal: Maintenance of adequate nutrition will improve Outcome: Progressing   Problem: Skin Integrity: Goal: Risk for impaired skin integrity will decrease Outcome: Progressing   Problem: Activity: Goal: Risk for activity intolerance will decrease Outcome: Progressing   Problem: Nutrition: Goal: Adequate nutrition will be maintained Outcome: Progressing   Problem: Coping: Goal: Level of anxiety will decrease Outcome: Progressing   Problem: Skin Integrity: Goal: Risk for impaired skin integrity will decrease Outcome: Progressing

## 2023-02-26 NOTE — Assessment & Plan Note (Signed)
During exam today patient reports living with her mom and her boyfriend.  This is entirely not true.  Review of documentation and indicates that on December 27, 2022 PCP how we are good to raise documented memory deficit.  This seems to have been pending further workup that has not currently been able to be completed.  Therefore I think patient does have chronic encephalopathy rather than acute.  She appears nontoxic nonfocal.  I will check B12 TSH and CNS imaging.  Last MRI from the brain is from May 2021 which revealed remote left occipital infarction.

## 2023-02-26 NOTE — Evaluation (Signed)
Occupational Therapy Evaluation Patient Details Name: Maria Fox MRN: [id number] DOB: 18-Mar-1945 Today's Date: 02/26/2023   History of Present Illness Pt is 77 yr old female admitted on 02/24/23 with sepsis due to UTI.  Also with acute on chronic diarrhea.  Pt with hx including but not limited to CAD/CABG, atrial flutter on Eliquis, orthostatism without anemia, CKD-3A, diastolic CHF, DM-2, HLD, GERD, recurrent UTI with chronic symptoms, chronic diarrhea/abdominal pain and anxiety   Clinical Impression   The pt completed all assessed ADLs with supervision or better, including lower body dressing, toileting at bathroom level, and hand washing standing at the sink. She appears to be near her baseline level of functioning for self-care management, and she is not presenting with functional deficits that warrant the need for further OT services. OT will sign off and recommend she return home at discharge.        If plan is discharge home, recommend the following: Assist for transportation;Assistance with cooking/housework    Functional Status Assessment  Patient has not had a recent decline in their functional status  Equipment Recommendations  None recommended by OT    Recommendations for Other Services       Precautions / Restrictions Restrictions Weight Bearing Restrictions Per Provider Order: No      Mobility Bed Mobility Overal bed mobility: Modified Independent Bed Mobility: Supine to Sit, Sit to Supine     Supine to sit: Modified independent (Device/Increase time) Sit to supine: Modified independent (Device/Increase time)        Transfers Overall transfer level: Needs assistance Equipment used: None Transfers: Sit to/from Stand Sit to Stand: Supervision                  Balance     Sitting balance-Leahy Scale: Good       Standing balance-Leahy Scale: Good            ADL either performed or assessed with clinical judgement   ADL           General ADL Comments: The pt performed all assessed ADLs with supervision or better, including lower body dressing/donning socks seated EOB, toileting at bathroom level, and hand washing in standing at the sink.     Vision   Additional Comments: She correctly read the time depicted on the wall clock.            Pertinent Vitals/Pain Pain Assessment Pain Assessment: 0-10 Pain Score: 6  Pain Location: headache Pain Intervention(s): Monitored during session, Patient requesting pain meds-RN notified     Extremity/Trunk Assessment Upper Extremity Assessment Upper Extremity Assessment: Overall WFL for tasks assessed;Left hand dominant   Lower Extremity Assessment Lower Extremity Assessment: Overall WFL for tasks assessed       Communication Communication Communication: No apparent difficulties   Cognition Arousal: Alert Behavior During Therapy: WFL for tasks assessed/performed Overall Cognitive Status: Within Functional Limits for tasks assessed            General Comments: Oriented to person, place, and month. Disoriented to year.                Home Living Family/patient expects to be discharged to:: Private residence Living Arrangements: Other (Comment) (Friend/significant other) Available Help at Discharge: Friend(s) Type of Home: House Home Access: Stairs to enter [employer] of Steps: 2 Entrance Stairs-Rails: Right;Left Home Layout: One level     Bathroom Shower/Tub: Tub/shower unit         Home Equipment: Agricultural consultant (2 wheels);Cane -  single point;Tub bench          Prior Functioning/Environment Prior Level of Function : Independent/Modified Independent             Mobility Comments:  (She reported occasional use of a RW.) ADLs Comments: Pt reports ind with self care, Maria Fox does the cooking and cleaning and driving              OT Treatment/Interventions:   N/A   OT Goals(Current goals can be found in the care plan  section) Acute Rehab OT Goals OT Goal Formulation: All assessment and education complete, DC therapy  OT Frequency:         AM-PAC OT "6 Clicks" Daily Activity     Outcome Measure Help from another person eating meals?: None Help from another person taking care of personal grooming?: None Help from another person toileting, which includes using toliet, bedpan, or urinal?: None Help from another person bathing (including washing, rinsing, drying)?: A Little Help from another person to put on and taking off regular upper body clothing?: None Help from another person to put on and taking off regular lower body clothing?: None 6 Click Score: 23   End of Session Equipment Utilized During Treatment: Other (comment) (N/A) Nurse Communication: Other (comment) (nurse cleared pt for participation in therapy)  Activity Tolerance: Patient tolerated treatment well Patient left: in bed;with call bell/phone within reach;with bed alarm set  OT Visit Diagnosis: Muscle weakness (generalized) (M62.81)                Time: 0981-1914 OT Time Calculation (min): 25 min Charges:  OT General Charges $OT Visit: 1 Visit OT Evaluation $OT Eval Low Complexity: 1 Low  Emidio Warrell L Diezel Mazur, OTR/L 02/26/2023, 3:16 PM

## 2023-02-26 NOTE — Progress Notes (Signed)
Progress Note   Patient: Maria Fox WUJ:[id number] DOB: 02/26/1946 DOA: 02/24/2023     2 DOS: the patient was seen and examined on 02/26/2023   Brief hospital course:    Assessment and Plan: * Urinary tract infection History of recurrent UTI (Klebsiella 08/2022, 12/2022) SIRS criteria include tachycardia and tachypnea and leukocytosis were present on admission.  As of February 26, 2023 these have resolved. Continue Rocephin Pyridium for symptomatic relief Follow cultures - growning > 100k GNR. Final ID and sens pending.  Acute diarrhea History of recurrent diarrhea(functional diarrhea/autonomic dysfunction per GI 2022) C. difficile quick screen is negative.  GI pathogen panel pending.  Patient currently reports no diarrhea. Last colonoscopy/endoscopy 2021: atrophy of gastric mucosa and tubular polyp  Hypomagnesemia Secondary to diarrhea Received oral repletion in the ED Monitor and correct  Paroxysmal atrial flutter ([name]) Chronic anticoagulation Patient currently in sinus rhythm Continue metoprolol and apixaban  CAD, multiple vessel s/p CABG x 3 Continue metoprolol, Imdur, atorvastatin, apixaban and nitroglycerin sublingual as needed Also takes Plavix , so will continue  Non-insulin treated type 2 diabetes mellitus ([name]) Check hemoglobin A1c.  Given patient's chronic kidney disease, metformin and glipizide have been held since admission.  Patient has actually good control with current dose of 15 units glargine and insulin sliding scale.  Continue with same.  Diabetic coordinator consult is pending to see if we can transition patient to insulin at home.  Chronic kidney disease, stage 3a ([name]) Renal function at baseline did however patient has mild acidosis with normal anion gap.  We will treat with sodium bicarbonate therapy.  Patient also seems to have chronic hypomagnesemia.  Patient will be started on oral magnesium oxide daily.  Peripheral neuropathy Continue  gabapentin and duloxetine  Confusion During exam today patient reports living with her mom and her boyfriend.  This is entirely not true.  Review of documentation and indicates that on December 27, 2022 PCP how we are good to raise documented memory deficit.  This seems to have been pending further workup that has not currently been able to be completed.  Therefore I think patient does have chronic encephalopathy rather than acute.  She appears nontoxic nonfocal.  I will check B12 TSH and CNS imaging.  Last MRI from the brain is from May 2021 which revealed remote left occipital infarction.  History of cerebrovascular accident (CVA) in adulthood Continue home meds  Essential hypertension Continue metoprolol        Subjective: General: Patient reports diarrhea has resolved.  No further documentation of diarrhea in the chart.  Patient has been noted to be tolerating diet.  Patient reports suprapubic discomfort.  There is no fever documented in the chart.  Physical Exam: Vitals:   02/25/23 1626 02/25/23 2018 02/26/23 0008 02/26/23 0432  BP: (!) 115/52 120/66 127/69 137/66  Pulse: 80 89 88 94  Resp: 16 14 18 16   Temp: (!) 97.5 F (36.4 C) 97.8 F (36.6 C) 98.3 F (36.8 C) 98.4 F (36.9 C)  TempSrc: Oral Oral Oral Oral  SpO2: 97% 96% 94% 98%  Weight:      Height:       General: Patient reports that she lives with her mother and her boyfriend.  She reports that she is 77 years old.  Appears to be in no distress. Respiratory exam: Bilateral intravesicular Cardiovascular exam S1-S2 normal Abdomen all quadrants are soft nontender Extremities warm without edema no focal motor deficit Data Reviewed:  Labs on Admission:  Results for  orders placed or performed during the hospital encounter of 02/24/23 (from the past 24 hours)  Glucose, capillary     Status: Abnormal   Collection Time: 02/25/23 12:26 PM  Result Value Ref Range   Glucose-Capillary 213 (H) 70 - 99 mg/dL  Glucose,  capillary     Status: Abnormal   Collection Time: 02/25/23  4:28 PM  Result Value Ref Range   Glucose-Capillary 152 (H) 70 - 99 mg/dL  Glucose, capillary     Status: Abnormal   Collection Time: 02/25/23  8:19 PM  Result Value Ref Range   Glucose-Capillary 142 (H) 70 - 99 mg/dL   Comment 1 Notify RN    Comment 2 Document in Chart   Comprehensive metabolic panel     Status: Abnormal   Collection Time: 02/26/23  4:38 AM  Result Value Ref Range   Sodium 135 135 - 145 mmol/L   Potassium 3.8 3.5 - 5.1 mmol/L   Chloride 108 98 - 111 mmol/L   CO2 20 (L) 22 - 32 mmol/L   Glucose, Bld 240 (H) 70 - 99 mg/dL   BUN 12 8 - 23 mg/dL   Creatinine, Ser 8.65 (H) 0.44 - 1.00 mg/dL   Calcium 8.2 (L) 8.9 - 10.3 mg/dL   Total Protein 5.8 (L) 6.5 - 8.1 g/dL   Albumin 2.8 (L) 3.5 - 5.0 g/dL   AST 15 15 - 41 U/L   ALT 10 0 - 44 U/L   Alkaline Phosphatase 60 38 - 126 U/L   Total Bilirubin 0.3 <1.2 mg/dL   GFR, Estimated 41 (L) >60 mL/min   Anion gap 7 5 - 15  CBC     Status: Abnormal   Collection Time: 02/26/23  4:38 AM  Result Value Ref Range   WBC 5.6 4.0 - 10.5 K/uL   RBC 3.34 (L) 3.87 - 5.11 MIL/uL   Hemoglobin 9.8 (L) 12.0 - 15.0 g/dL   HCT 78.4 (L) 69.6 - 29.5 %   MCV 92.8 80.0 - 100.0 fL   MCH 29.3 26.0 - 34.0 pg   MCHC 31.6 30.0 - 36.0 g/dL   RDW 28.4 13.2 - 44.0 %   Platelets 180 150 - 400 K/uL   nRBC 0.0 0.0 - 0.2 %  Magnesium     Status: Abnormal   Collection Time: 02/26/23  4:38 AM  Result Value Ref Range   Magnesium 1.4 (L) 1.7 - 2.4 mg/dL  Phosphorus     Status: None   Collection Time: 02/26/23  4:38 AM  Result Value Ref Range   Phosphorus 3.4 2.5 - 4.6 mg/dL  Glucose, capillary     Status: Abnormal   Collection Time: 02/26/23  7:34 AM  Result Value Ref Range   Glucose-Capillary 185 (H) 70 - 99 mg/dL   Comment 1 Notify RN    Basic Metabolic Panel: Recent Labs  Lab 02/24/23 1619 02/25/23 0408 02/26/23 0438  NA 138 136 135  K 3.9 4.0 3.8  CL 107 107 108  CO2 18* 21*  20*  GLUCOSE 182* 219* 240*  BUN 22 20 12   CREATININE 1.47* 1.29* 1.32*  CALCIUM 9.2 8.2* 8.2*  MG 1.4* 1.6* 1.4*  PHOS  --   --  3.4   Liver Function Tests: Recent Labs  Lab 02/24/23 1619 02/26/23 0438  AST 21 15  ALT 12 10  ALKPHOS 60 60  BILITOT 1.0 0.3  PROT 7.4 5.8*  ALBUMIN 3.8 2.8*   Recent Labs  Lab 02/24/23 1619  LIPASE 29   No results for input(s): "AMMONIA" in the last 168 hours. CBC: Recent Labs  Lab 02/24/23 1619 02/25/23 0408 02/26/23 0438  WBC 11.5* 5.8 5.6  NEUTROABS 8.1*  --   --   HGB 12.7 10.2* 9.8*  HCT 37.2 31.7* 31.0*  MCV 88.2 91.1 92.8  PLT 279 186 180   Cardiac Enzymes: No results for input(s): "CKTOTAL", "CKMB", "CKMBINDEX", "TROPONINIHS" in the last 168 hours.  BNP (last 3 results) No results for input(s): "PROBNP" in the last 8760 hours. CBG: Recent Labs  Lab 02/25/23 0833 02/25/23 1226 02/25/23 1628 02/25/23 2019 02/26/23 0734  GLUCAP 239* 213* 152* 142* 185*    Radiological Exams on Admission:  CT ABDOMEN PELVIS WO CONTRAST Result Date: 02/24/2023 CLINICAL DATA:  Possible sepsis. Abdominal pain. UTI like symptoms for extended period of time. EXAM: CT ABDOMEN AND PELVIS WITHOUT CONTRAST TECHNIQUE: Multidetector CT imaging of the abdomen and pelvis was performed following the standard protocol without IV contrast. RADIATION DOSE REDUCTION: This exam was performed according to the departmental dose-optimization program which includes automated exposure control, adjustment of the mA and/or kV according to patient size and/or use of iterative reconstruction technique. COMPARISON:  CT abdomen and pelvis 07/21/2022 FINDINGS: Lower chest: No acute abnormality. Hepatobiliary: No focal liver abnormality is seen. Status post cholecystectomy. No biliary dilatation. Pancreas: Unremarkable. No pancreatic ductal dilatation or surrounding inflammatory changes. Spleen: No acute abnormality. Adrenals/Urinary Tract: Stable adrenal glands and  kidneys. No urinary calculi or hydronephrosis. Unremarkable bladder. Stomach/Bowel: No bowel obstruction or bowel wall thickening. Colonic diverticulosis without diverticulitis. Stomach and appendix are within normal limits. Vascular/Lymphatic: Aortic atherosclerosis. No enlarged abdominal or pelvic lymph nodes. Reproductive: No acute abnormality. Other: No free intraperitoneal fluid or air. Musculoskeletal: No acute fracture. IMPRESSION: No acute abnormality in the abdomen or pelvis. Aortic Atherosclerosis (ICD10-I70.0). Electronically Signed   By: Minerva Fester M.D.   On: 02/24/2023 19:14   DG Chest Portable 1 View Result Date: 02/24/2023 CLINICAL DATA:  Abdominal pain and weakness. EXAM: PORTABLE CHEST 1 VIEW COMPARISON:  March 01, 2022 FINDINGS: Multiple sternal wires and vascular clips are seen. The heart size and mediastinal contours are within normal limits. A radiopaque the first order is in place. Mild atelectasis is seen within the retrocardiac region of the left lung base. No pleural effusion or pneumothorax is identified. The visualized skeletal structures are unremarkable. IMPRESSION: 1. Evidence of prior median sternotomy/CABG. 2. Mild left basilar atelectasis. Electronically Signed   By: Aram Candela M.D.   On: 02/24/2023 18:34     Family Communication: attempted to reach significant other on [phone]. Mailbox is full no voicmeail left.  Disposition: Status is: Inpatient Remains inpatient appropriate because: We are still awaiting urine culture results.  Planned Discharge Destination: Home with supervision.    Time spent: 35 minutes  Author: Nolberto Hanlon, MD 02/26/2023 9:39 AM  For on call review [email or web address].

## 2023-02-26 NOTE — Patient Outreach (Signed)
  Care Coordination   02/26/2023 Name: Maria Fox MRN: [id number] DOB: 02/19/1946   Care Coordination Outreach Attempts:  Per chart review patient admitted to [facility] on 02/24/23 for acute cystitis.   Hospital liaison, Charlesetta Shanks notified of admission.    Follow Up Plan:  Additional outreach attempts will be made to offer the patient complex care management information and services.   Encounter Outcome:  Patient Visit Completed   Care Coordination Interventions:  No, not indicated    [name] RN,BSN,CCM [facility]  [facility], [facility] coordinator / Case Manager Phone: [phone]

## 2023-02-26 NOTE — TOC Initial Note (Signed)
Transition of Care [facility]) - Initial/Assessment Note    Patient Details  Name: Maria Fox MRN: [id number] Date of Birth: [birth year]  Transition of Care [facility]) CM/SW Contact:    Lanier Clam, RN Phone Number: 02/26/2023, 2:04 PM  Clinical Narrative: d/c plan home.                    Barriers to Discharge: Continued Medical Work up   Patient Goals and CMS Choice            Expected Discharge Plan and Services                                              Prior Living Arrangements/Services                       Activities of Daily Living   ADL Screening (condition at time of admission) Independently performs ADLs?: Yes (appropriate for developmental age) Is the patient deaf or have difficulty hearing?: No Does the patient have difficulty seeing, even when wearing glasses/contacts?: Yes Does the patient have difficulty concentrating, remembering, or making decisions?: Yes  Permission Sought/Granted                  Emotional Assessment              Admission diagnosis:  Dehydration [E86.0] Weakness [R53.1] Urinary tract infection [N39.0] Acute cystitis without hematuria [N30.00] Diarrhea, unspecified type [R19.7] Patient Active Problem List   Diagnosis Date Noted   Confusion 02/26/2023   Urinary tract infection 02/24/2023   SIRS (systemic inflammatory response syndrome) ([name]) 02/24/2023   Hypomagnesemia 02/24/2023   Glaucoma (increased eye pressure) 12/10/2022   Hx of atrial flutter 12/09/2022   Reactive airway disease 12/09/2022   Non-insulin treated type 2 diabetes mellitus ([name]) 12/09/2022   GAD (generalized anxiety disorder) 12/09/2022   Peripheral neuropathy 12/09/2022   Pre-op evaluation 08/21/2022   Gross hematuria 07/27/2022   Recurrent UTI 07/21/2022   Chronic anticoagulation 07/21/2022   Chronic anemia 04/05/2022   POAG (primary open-angle glaucoma) 01/04/2022   Memory deficit 11/07/2021   Functional abdominal  pain syndrome 07/29/2020   Acute diarrhea    Atypical chest pain 07/04/2020   Low-tension glaucoma, bilateral, severe stage 03/24/2020   Right sided temporal headache 08/06/2019   Paroxysmal atrial flutter ([name])    Acute deep vein thrombosis (DVT) of left tibial vein ([name]) 07/11/2019   Health maintenance examination 12/11/2018   Type 2 diabetes mellitus with mild nonproliferative diabetic retinopathy without macular edema, bilateral ([name]) 07/16/2018   CAD, multiple vessel s/p CABG x 3    Effort angina ([name]) 11/12/2017   Abnormal stress ECG    Dyspnea on exertion 11/04/2017   GERD (gastroesophageal reflux disease) 08/15/2017   Trigger finger 06/09/2017   Nocturnal leg movements 03/25/2017   Osteopenia 02/01/2017   Advanced care planning/counseling discussion 01/02/2017   Chronic kidney disease, stage 3a ([name]) 08/26/2016   Insomnia 08/26/2016   Vitamin B12 deficiency 11/10/2015   Fatty liver disease, nonalcoholic 09/23/2014   Chronic diastolic CHF (congestive heart failure) ([name]) 08/27/2014   Positive hepatitis C antibody test 08/27/2014   Iron deficiency 08/27/2014   NSVT (nonsustained ventricular tachycardia) ([name])    Nausea without vomiting    Autonomic orthostatic hypotension 07/11/2014   S/P CABG x 3 07/08/2014   History of cerebrovascular accident (  CVA) in adulthood 07/08/2014   Type 2 diabetes mellitus with neurological complications ([name]) 06/02/2012   Itching 03/16/2012   Contact dermatitis 02/27/2012   Medicare annual wellness visit, initial 10/28/2011   HLD (hyperlipidemia)    Idiopathic angioedema 06/23/2011   Vitiligo 06/23/2011   Dermatitis 06/23/2011   MDD (major depressive disorder), recurrent episode, moderate ([name]) 05/08/2006   Essential hypertension 05/08/2006   PCP:  Eustaquio Boyden, MD Pharmacy:   [phone] [place], [place] - [address] [facility] [address] [place] [address] Phone:  [phone] Fax: [phone]     Social Drivers of Health (SDOH) Social History: SDOH Screenings   Food Insecurity: No Food Insecurity (02/25/2023)  Housing: Low Risk  (02/25/2023)  Transportation Needs: No Transportation Needs (02/25/2023)  Utilities: Not At Risk (02/25/2023)  Alcohol Screen: Low Risk  (12/03/2022)  Depression (PHQ2-9): High Risk (12/27/2022)  Financial Resource Strain: Low Risk  (12/03/2022)  Physical Activity: Inactive (12/03/2022)  Social Connections: Moderately Integrated (12/03/2022)  Stress: No Stress Concern Present (12/03/2022)  Tobacco Use: Low Risk  (02/24/2023)  Health Literacy: Adequate Health Literacy (12/03/2022)   SDOH Interventions: Food Insecurity Interventions: Intervention Not Indicated Housing Interventions: Intervention Not Indicated Transportation Interventions: Intervention Not Indicated Utilities Interventions: Intervention Not Indicated   Readmission Risk Interventions    07/25/2022   10:12 AM 02/26/2022   12:55 PM  Readmission Risk Prevention Plan  Transportation Screening Complete Complete  PCP or Specialist Appt within 5-7 Days Complete   Home Care Screening Complete   Medication Review (RN CM) Complete   HRI or Home Care Consult  Complete  Social Work Consult for Recovery Care Planning/Counseling  Complete  Palliative Care Screening  Not Applicable

## 2023-02-27 DIAGNOSIS — R197 Diarrhea, unspecified: Secondary | ICD-10-CM | POA: Diagnosis not present

## 2023-02-27 DIAGNOSIS — I4892 Unspecified atrial flutter: Secondary | ICD-10-CM | POA: Diagnosis not present

## 2023-02-27 DIAGNOSIS — F411 Generalized anxiety disorder: Secondary | ICD-10-CM | POA: Diagnosis not present

## 2023-02-27 DIAGNOSIS — N3000 Acute cystitis without hematuria: Secondary | ICD-10-CM | POA: Diagnosis not present

## 2023-02-27 LAB — GLUCOSE, CAPILLARY
Glucose-Capillary: 224 mg/dL — ABNORMAL HIGH (ref 70–99)
Glucose-Capillary: 366 mg/dL — ABNORMAL HIGH (ref 70–99)
Glucose-Capillary: 164 mg/dL — ABNORMAL HIGH (ref 70–99)
Glucose-Capillary: 141 mg/dL — ABNORMAL HIGH (ref 70–99)

## 2023-02-27 LAB — GASTROINTESTINAL PANEL BY PCR, STOOL (REPLACES STOOL CULTURE)

## 2023-02-27 LAB — HEMOGLOBIN A1C
Hgb A1c MFr Bld: 9.6 % — ABNORMAL HIGH (ref 4.8–5.6)
Mean Plasma Glucose: 228.82 mg/dL

## 2023-02-27 LAB — CBC
HCT: 35.2 % — ABNORMAL LOW (ref 36.0–46.0)
Hemoglobin: 11.2 g/dL — ABNORMAL LOW (ref 12.0–15.0)
MCH: 29.4 pg (ref 26.0–34.0)
MCHC: 31.8 g/dL (ref 30.0–36.0)
MCV: 92.4 fL (ref 80.0–100.0)
Platelets: 200 10*3/uL (ref 150–400)
RBC: 3.81 MIL/uL — ABNORMAL LOW (ref 3.87–5.11)
RDW: 13.1 % (ref 11.5–15.5)
WBC: 6.2 10*3/uL (ref 4.0–10.5)
nRBC: 0 % (ref 0.0–0.2)

## 2023-02-27 LAB — CULTURE, OB URINE: Culture: >=100000 — AB

## 2023-02-27 LAB — BASIC METABOLIC PANEL
Anion gap: 8 (ref 5–15)
BUN: 12 mg/dL (ref 8–23)
CO2: 25 mmol/L (ref 22–32)
Calcium: 8.9 mg/dL (ref 8.9–10.3)
Chloride: 104 mmol/L (ref 98–111)
Creatinine, Ser: 1.22 mg/dL — ABNORMAL HIGH (ref 0.44–1.00)
GFR, Estimated: 45 mL/min — ABNORMAL LOW (ref >60)
Glucose, Bld: 229 mg/dL — ABNORMAL HIGH (ref 70–99)
Potassium: 4.3 mmol/L (ref 3.5–5.1)
Sodium: 137 mmol/L (ref 135–145)

## 2023-02-27 LAB — MAGNESIUM: Magnesium: 1.5 mg/dL — ABNORMAL LOW (ref 1.7–2.4)

## 2023-02-27 LAB — TSH: TSH: 1.196 u[IU]/mL (ref 0.350–4.500)

## 2023-02-27 LAB — VITAMIN B12: Vitamin B-12: 460 pg/mL (ref 180–914)

## 2023-02-27 MED ORDER — INSULIN GLARGINE-YFGN 100 UNIT/ML ~~LOC~~ SOLN
15.0000 [IU] | Freq: Two times a day (BID) | SUBCUTANEOUS | Status: DC
Start: 2023-02-27 — End: 2023-02-28
  Administered 2023-02-27: 15 [IU] via SUBCUTANEOUS
  Filled 2023-02-27 (×2): qty 0.15

## 2023-02-27 MED ORDER — DORZOLAMIDE HCL-TIMOLOL MAL 2-0.5 % OP SOLN
1.0000 [drp] | Freq: Two times a day (BID) | OPHTHALMIC | Status: DC
  Administered 2023-02-27 – 2023-03-01 (×4): 1 [drp] via OPHTHALMIC
  Filled 2023-02-27: qty 10

## 2023-02-27 MED ORDER — LATANOPROST 0.005 % OP SOLN
1.0000 [drp] | Freq: Every day | OPHTHALMIC | Status: DC
Start: 2023-02-27 — End: 2023-03-01
  Administered 2023-02-27 – 2023-02-28 (×2): 1 [drp] via OPHTHALMIC
  Filled 2023-02-27: qty 2.5

## 2023-02-27 MED ORDER — INSULIN ASPART 100 UNIT/ML IJ SOLN
5.0000 [IU] | Freq: Three times a day (TID) | INTRAMUSCULAR | Status: DC
  Administered 2023-02-27: 5 [IU] via SUBCUTANEOUS

## 2023-02-27 MED ORDER — CEPHALEXIN 500 MG PO CAPS
500.0000 mg | ORAL_CAPSULE | Freq: Two times a day (BID) | ORAL | Status: AC
  Administered 2023-02-27 – 2023-02-28 (×4): 500 mg via ORAL
  Filled 2023-02-27 (×4): qty 1

## 2023-02-27 MED ORDER — MAGNESIUM SULFATE 2 GM/50ML IV SOLN
2.0000 g | Freq: Once | INTRAVENOUS | Status: AC
  Administered 2023-02-27: 2 g via INTRAVENOUS
  Filled 2023-02-27: qty 50

## 2023-02-27 NOTE — Inpatient Diabetes Management (Signed)
Inpatient Diabetes Program Recommendations  AACE/ADA: New Consensus Statement on Inpatient Glycemic Control (2015)  Target Ranges:  Prepandial:   less than 140 mg/dL      Peak postprandial:   less than 180 mg/dL (1-2 hours)      Critically ill patients:  140 - 180 mg/dL    Latest Reference Range & Units 02/26/23 07:34 02/26/23 12:02 02/26/23 16:37 02/26/23 21:00  Glucose-Capillary 70 - 99 mg/dL 332 (H)  3 units Novolog  231 (H)  5 units Novolog  15 units Semglee @1000   208 (H)  5 units Novolog  175 (H)  (H): Data is abnormally high  Latest Reference Range & Units 02/27/23 07:33  Glucose-Capillary 70 - 99 mg/dL 951 (H)  5 units Novolog  15 units Semglee  (H): Data is abnormally high    Home DM Meds: Glipizide 10 mg BID + Metformin 1000 QAM   Current Orders: Novolog Moderate Correction Scale/ SSI (0-15 units) TID AC + HS                           Semglee 15 units daily       Consult received to have pt start insulin at home   MD- Please consider:  Increase Semglee to 18 units Daily  Based on my interaction with pt, I think we should keep the insulin regimen to once daily basal to keep it a simple regimen.  I would worry that pt could get confused with 2 different insulins and multiple doses.     Recommend for discharge: 1. Stop Glipizide and Metformin 2. Start Once daily Basal insulin for home 3. Start Tradjenta 5 mg daily (DPP-4 inhibitor)    --Will follow patient during hospitalization--  Ambrose Finland RN, MSN, CDCES Diabetes Coordinator Inpatient Glycemic Control Team Team Pager: [phone] (8a-5p)

## 2023-02-27 NOTE — Progress Notes (Signed)
PROGRESS NOTE  Maria Fox QQV:[id number] DOB: [birth year]   PCP: Eustaquio Boyden, MD  Patient is from: Home.  DOA: 02/24/2023 LOS: 3  Chief complaints Chief Complaint  Patient presents with   Abdominal Pain   Weakness     Brief Narrative / Interim history: 77 year old F with PMH of CAD/CABG, atrial flutter on Eliquis, orthostatism without anemia, CKD-3A, diastolic CHF, DM-2, HLD, GERD, recurrent UTI with chronic symptoms, chronic diarrhea/abdominal pain and anxiety presenting with watery diarrhea for about 5 days, dysuria, nausea, dry heaving and generalized weakness and admitted with working diagnosis of urinary tract infection and diarrhea.  She has mild leukocytosis, tachycardia with UA strongly suggesting UTI.  CT abdomen and pelvis without acute finding.  Patient was admitted and started on IV ceftriaxone, IV fluids, antiemetics.  C. difficile and GIP negative.  Diarrhea symptoms improved.  Urine culture with Klebsiella pneumoniae with resistance to ampicillin and Macrobid.  Blood cultures NGTD.  Patient continues to endorse significant dysuria.  Discussed with on-call urology NP on 12/19, and he did not feel there anything to offer while inpatient but recommended outpatient follow-up as previously planned.   Subjective: Seen and examined earlier this morning.  No major events overnight of this morning.  She continues to endorse significant dysuria despite appropriate antibiotics.  No other complaints.  Objective: Vitals:   02/26/23 0432 02/26/23 1922 02/27/23 0556 02/27/23 1148  BP: 137/66 (!) 144/62 (!) 167/92 (!) 146/88  Pulse: 94 87 84 95  Resp: 16 (!) 25 (!) 21 17  Temp: 98.4 F (36.9 C) 97.8 F (36.6 C) (!) 97.5 F (36.4 C) 98.2 F (36.8 C)  TempSrc: Oral Oral Oral Oral  SpO2: 98% 97% 97% 96%  Weight:      Height:        Examination:  GENERAL: No apparent distress.  Nontoxic. HEENT: MMM.  Vision and hearing grossly intact.  NECK: Supple.  No apparent JVD.   RESP:  No IWOB.  Fair aeration bilaterally. CVS:  RRR. Heart sounds normal.  ABD/GI/GU: BS+. Abd soft, NTND.  MSK/EXT:  Moves extremities. No apparent deformity. No edema.  SKIN: no apparent skin lesion or wound NEURO: Sleepy but wakes to voice.  Oriented appropriately.  No apparent focal neuro deficit. PSYCH: Calm. Normal affect.   Procedures:  None  Microbiology summarized: COVID-19, influenza and RSV PCR nonreactive Blood cultures NGTD Urine culture with Klebsiella pneumonia. C. difficile negative GIP negative.  Assessment and plan: Sepsis due to urinary tract infection: POA.  Has leukocytosis with tachycardia.  UA and urine culture positive. Prior Klebsiella UTI in 08/2022 on 12/2022.  Continues to endorse significant dysuria despite appropriate antibiotics or Pyridium.  Worried about interstitial cystitis and possible vaginal atrophy.  CT abdomen and pelvis without acute finding.  Discussed with on-call urology NP on 12/19 but did not feel there anything to offer while inpatient but recommended outpatient follow-up as previously planned. -Continue IV ceftriaxone for a total of 5 days. -Patient reports upcoming appointment with urology next month.   Acute on chronic diarrhea: Patient with history of recurrent diarrhea and autonomic dysfunction per GI in 2022.  EGD/Colo in 2021 with atrophy of gastric mucosa no polyps.  CT abdomen and pelvis without acute finding abdominal exam benign.  C. difficile and GIP negative.  Improved. -Advance to regular diet. -Consider stopping metformin on discharge  CAD, multiple vessel s/p CABG x 3: No cardiopulmonary symptoms. -Continue metoprolol, Imdur, atorvastatin, apixaban, Plavix and nitro   NIDDM-2 with hyperglycemia: A1c 9.3%  on 10/18.  On metformin and glipizide at home. Recent Labs  Lab 02/26/23 1202 02/26/23 1637 02/26/23 2100 02/27/23 0733 02/27/23 1146  GLUCAP 231* 208* 175* 224* 366*  -Continue SSI-moderate -Increase Semglee  from 15 units daily to twice daily -Add NovoLog 5 units 3 times daily with meals -Change diet to carb modified -Needs insulin on discharge.   Paroxysmal atrial flutter: Rate controlled -Continue metoprolol and Eliquis -Optimize electrolytes   Persistent hypomagnesemia: Likely due to diarrhea -Monitor replenish as appropriate  CKD-3A: Stable -Monitor   Peripheral neuropathy -Continue gabapentin and duloxetine   History of CVA -Continue home meds   Essential hypertension -Continue metoprolol and Imdur  Generalized weakness: Likely due to acute illness.  Lives with friend.  Uses rolling walker as needed. -PT/OT  Positive blood culture: GPR in 1 out of 2 bottles likely contaminant -Follow speciation  Body mass index is 23.03 kg/m.          DVT prophylaxis:   apixaban (ELIQUIS) tablet 5 mg  Code Status: Full code Family Communication: None at bedside Level of care: Med-Surg Status is: Inpatient Remains inpatient appropriate because: UTI, diarrhea and generalized weakness   Final disposition: Likely home once medically stable Consultants:  Urology over the phone on 12/19  55 minutes with more than 50% spent in reviewing records, counseling patient/family and coordinating care.   Sch Meds:  Scheduled Meds:  apixaban  5 mg Oral BID   atorvastatin  40 mg Oral Daily   cephALEXin  500 mg Oral Q12H   clopidogrel  75 mg Oral Daily   dorzolamide-timolol  1 drop Both Eyes BID   DULoxetine  60 mg Oral Daily   feeding supplement  237 mL Oral BID BM   gabapentin  300 mg Oral BID   insulin aspart  0-15 Units Subcutaneous TID WC   insulin aspart  0-5 Units Subcutaneous QHS   insulin aspart  5 Units Subcutaneous TID WC   insulin glargine-yfgn  15 Units Subcutaneous BID   isosorbide mononitrate  15 mg Oral Daily   latanoprost  1 drop Both Eyes QHS   metoprolol succinate  25 mg Oral Daily   pantoprazole  40 mg Oral BID AC   Continuous Infusions:   PRN  Meds:.acetaminophen **OR** acetaminophen, nitroGLYCERIN, ondansetron **OR** ondansetron (ZOFRAN) IV, traMADol  Antimicrobials: Anti-infectives (From admission, onward)    Start     Dose/Rate Route Frequency Ordered Stop   02/27/23 1345  cephALEXin (KEFLEX) capsule 500 mg        500 mg Oral Every 12 hours 02/27/23 1245 03/01/23 0959   02/25/23 2200  cefTRIAXone (ROCEPHIN) 1 g in sodium chloride 0.9 % 100 mL IVPB  Status:  Discontinued        1 g 200 mL/hr over 30 Minutes Intravenous Every 24 hours 02/25/23 1800 02/27/23 1245   02/24/23 2145  cefTRIAXone (ROCEPHIN) 1 g in sodium chloride 0.9 % 100 mL IVPB        1 g 200 mL/hr over 30 Minutes Intravenous  Once 02/24/23 2137 02/24/23 2354        I have personally reviewed the following labs and images: CBC: Recent Labs  Lab 02/24/23 1619 02/25/23 0408 02/26/23 0438 02/27/23 0506  WBC 11.5* 5.8 5.6 6.2  NEUTROABS 8.1*  --   --   --   HGB 12.7 10.2* 9.8* 11.2*  HCT 37.2 31.7* 31.0* 35.2*  MCV 88.2 91.1 92.8 92.4  PLT 279 186 180 200   BMP &GFR Recent  Labs  Lab 02/24/23 1619 02/25/23 0408 02/26/23 0438 02/27/23 0506  NA 138 136 135 137  K 3.9 4.0 3.8 4.3  CL 107 107 108 104  CO2 18* 21* 20* 25  GLUCOSE 182* 219* 240* 229*  BUN 22 20 12 12   CREATININE 1.47* 1.29* 1.32* 1.22*  CALCIUM 9.2 8.2* 8.2* 8.9  MG 1.4* 1.6* 1.4* 1.5*  PHOS  --   --  3.4  --    Estimated Creatinine Clearance: 30.9 mL/min (A) (by C-G formula based on SCr of 1.22 mg/dL (H)). Liver & Pancreas: Recent Labs  Lab 02/24/23 1619 02/26/23 0438  AST 21 15  ALT 12 10  ALKPHOS 60 60  BILITOT 1.0 0.3  PROT 7.4 5.8*  ALBUMIN 3.8 2.8*   Recent Labs  Lab 02/24/23 1619  LIPASE 29   No results for input(s): "AMMONIA" in the last 168 hours. Diabetic: Recent Labs    02/27/23 0506  HGBA1C 9.6*   Recent Labs  Lab 02/26/23 1202 02/26/23 1637 02/26/23 2100 02/27/23 0733 02/27/23 1146  GLUCAP 231* 208* 175* 224* 366*   Cardiac Enzymes: No  results for input(s): "CKTOTAL", "CKMB", "CKMBINDEX", "TROPONINI" in the last 168 hours. No results for input(s): "PROBNP" in the last 8760 hours. Coagulation Profile: No results for input(s): "INR", "PROTIME" in the last 168 hours. Thyroid Function Tests: Recent Labs    02/27/23 0506  TSH 1.196   Lipid Profile: No results for input(s): "CHOL", "HDL", "LDLCALC", "TRIG", "CHOLHDL", "LDLDIRECT" in the last 72 hours. Anemia Panel: Recent Labs    02/27/23 0506  VITAMINB12 460   Urine analysis:    Component Value Date/Time   COLORURINE YELLOW 02/24/2023 2103   APPEARANCEUR TURBID (A) 02/24/2023 2103   LABSPEC 1.017 02/24/2023 2103   PHURINE 5.0 02/24/2023 2103   GLUCOSEU NEGATIVE 02/24/2023 2103   HGBUR MODERATE (A) 02/24/2023 2103   BILIRUBINUR NEGATIVE 02/24/2023 2103   BILIRUBINUR 1+ 12/27/2022 1555   KETONESUR 5 (A) 02/24/2023 2103   PROTEINUR 100 (A) 02/24/2023 2103   UROBILINOGEN 0.2 12/27/2022 1555   UROBILINOGEN 0.2 09/16/2014 0009   NITRITE POSITIVE (A) 02/24/2023 2103   LEUKOCYTESUR LARGE (A) 02/24/2023 2103   Sepsis Labs: Invalid input(s): "PROCALCITONIN", "LACTICIDVEN"  Microbiology: Recent Results (from the past 240 hours)  Resp panel by RT-PCR (RSV, Flu A&B, Covid) Anterior Nasal Swab     Status: None   Collection Time: 02/24/23  4:53 PM   Specimen: Anterior Nasal Swab  Result Value Ref Range Status   SARS Coronavirus 2 by RT PCR NEGATIVE NEGATIVE Final    Comment: (NOTE) SARS-CoV-2 target nucleic acids are NOT DETECTED.  The SARS-CoV-2 RNA is generally detectable in upper respiratory specimens during the acute phase of infection. The lowest concentration of SARS-CoV-2 viral copies this assay can detect is 138 copies/mL. A negative result does not preclude SARS-Cov-2 infection and should not be used as the sole basis for treatment or other patient management decisions. A negative result may occur with  improper specimen collection/handling, submission  of specimen other than nasopharyngeal swab, presence of viral mutation(s) within the areas targeted by this assay, and inadequate number of viral copies(<138 copies/mL). A negative result must be combined with clinical observations, patient history, and epidemiological information. The expected result is Negative.  Fact Sheet for Patients:  BloggerCourse.com  Fact Sheet for Healthcare Providers:  SeriousBroker.it  This test is no t yet approved or cleared by the [place] FDA and  has been authorized for detection and/or diagnosis of  SARS-CoV-2 by FDA under an Emergency Use Authorization (EUA). This EUA will remain  in effect (meaning this test can be used) for the duration of the COVID-19 declaration under Section 564(b)(1) of the Act, 21 U.S.C.section 360bbb-3(b)(1), unless the authorization is terminated  or revoked sooner.       Influenza A by PCR NEGATIVE NEGATIVE Final   Influenza B by PCR NEGATIVE NEGATIVE Final    Comment: (NOTE) The Xpert Xpress SARS-CoV-2/FLU/RSV plus assay is intended as an aid in the diagnosis of influenza from Nasopharyngeal swab specimens and should not be used as a sole basis for treatment. Nasal washings and aspirates are unacceptable for Xpert Xpress SARS-CoV-2/FLU/RSV testing.  Fact Sheet for Patients: BloggerCourse.com  Fact Sheet for Healthcare Providers: SeriousBroker.it  This test is not yet approved or cleared by the [place] FDA and has been authorized for detection and/or diagnosis of SARS-CoV-2 by FDA under an Emergency Use Authorization (EUA). This EUA will remain in effect (meaning this test can be used) for the duration of the COVID-19 declaration under Section 564(b)(1) of the Act, 21 U.S.C. section 360bbb-3(b)(1), unless the authorization is terminated or revoked.     Resp Syncytial Virus by PCR NEGATIVE NEGATIVE  Final    Comment: (NOTE) Fact Sheet for Patients: BloggerCourse.com  Fact Sheet for Healthcare Providers: SeriousBroker.it  This test is not yet approved or cleared by the [place] FDA and has been authorized for detection and/or diagnosis of SARS-CoV-2 by FDA under an Emergency Use Authorization (EUA). This EUA will remain in effect (meaning this test can be used) for the duration of the COVID-19 declaration under Section 564(b)(1) of the Act, 21 U.S.C. section 360bbb-3(b)(1), unless the authorization is terminated or revoked.  Performed at [facility], [address], [place], [place] [address]   Culture, [place] Urine     Status: Abnormal   Collection Time: 02/24/23  9:03 PM   Specimen: Urine, Random  Result Value Ref Range Status   Specimen Description   Final    URINE, RANDOM Performed at [facility], [address] [place], [place] [address]    Special Requests   Final    NONE Performed at [facility], [address], [place], [place] [address]    Culture >=100,000 COLONIES/mL KLEBSIELLA PNEUMONIAE (A)  Final   Report Status 02/27/2023 FINAL  Final   Organism ID, Bacteria KLEBSIELLA PNEUMONIAE (A)  Final      Susceptibility   Klebsiella pneumoniae - MIC*    AMPICILLIN RESISTANT Resistant     CEFEPIME <=0.12 SENSITIVE Sensitive     CEFTRIAXONE <=0.25 SENSITIVE Sensitive     CIPROFLOXACIN <=0.25 SENSITIVE Sensitive     GENTAMICIN <=1 SENSITIVE Sensitive     IMIPENEM <=0.25 SENSITIVE Sensitive     NITROFURANTOIN 64 INTERMEDIATE Intermediate     TRIMETH/SULFA <=20 SENSITIVE Sensitive     AMPICILLIN/SULBACTAM 4 SENSITIVE Sensitive     PIP/TAZO <=4 SENSITIVE Sensitive ug/mL    * >=100,000 COLONIES/mL KLEBSIELLA PNEUMONIAE  Blood culture (routine x 2)     Status: None (Preliminary result)   Collection Time: 02/24/23  9:55 PM   Specimen: BLOOD  Result Value Ref  Range Status   Specimen Description   Final    BLOOD LEFT ANTECUBITAL Performed at [facility], [address] [place], [place] [address]    Special Requests   Final    BOTTLES DRAWN AEROBIC AND ANAEROBIC Blood Culture adequate volume Performed at [facility], [address], [place], [place] [address]    Culture   Final    NO GROWTH 2 DAYS Performed at [facility], [address], [place], [place] [address]    Report Status PENDING  Incomplete  Blood culture (routine x 2)     Status: None (Preliminary result)   Collection Time: 02/24/23 10:05 PM   Specimen: BLOOD RIGHT ARM  Result Value Ref Range Status   Specimen Description   Final    BLOOD RIGHT ARM Performed at [facility], [address], [place], [place] [address]    Special Requests   Final    BOTTLES DRAWN AEROBIC AND ANAEROBIC Blood Culture adequate volume Performed at [facility], [address] [place], [place] [address]    Culture  Setup Time   Final    GRAM POSITIVE RODS BOTTLES DRAWN AEROBIC ONLY CRITICAL RESULT CALLED TO, READ BACK BY AND VERIFIED WITH: C. ROBERTSON [id number]    Culture   Final    GRAM POSITIVE RODS IDENTIFICATION TO FOLLOW Performed at [facility], [address], [place], [place] [address]    Report Status PENDING  Incomplete  C Difficile Quick Screen w PCR reflex     Status: None   Collection Time: 02/25/23  4:08 AM   Specimen: STOOL  Result Value Ref Range Status   C Diff antigen NEGATIVE NEGATIVE Final   C Diff toxin NEGATIVE NEGATIVE Final   C Diff interpretation No C. difficile detected.  Final    Comment: Performed at [facility], [address] [place], [place] [address]  Gastrointestinal Panel by PCR , Stool     Status: None   Collection Time: 02/26/23  5:11 PM   Specimen: Stool  Result Value Ref Range Status   Campylobacter species NOT DETECTED NOT  DETECTED Final   Plesimonas shigelloides NOT DETECTED NOT DETECTED Final   Salmonella species NOT DETECTED NOT DETECTED Final   Yersinia enterocolitica NOT DETECTED NOT DETECTED Final   Vibrio species NOT DETECTED NOT DETECTED Final   Vibrio cholerae NOT DETECTED NOT DETECTED Final   Enteroaggregative E coli (EAEC) NOT DETECTED NOT DETECTED Final   Enteropathogenic E coli (EPEC) NOT DETECTED NOT DETECTED Final   Enterotoxigenic E coli (ETEC) NOT DETECTED NOT DETECTED Final   Shiga like toxin producing E coli (STEC) NOT DETECTED NOT DETECTED Final   Shigella/Enteroinvasive E coli (EIEC) NOT DETECTED NOT DETECTED Final   Cryptosporidium NOT DETECTED NOT DETECTED Final   Cyclospora cayetanensis NOT DETECTED NOT DETECTED Final   Entamoeba histolytica NOT DETECTED NOT DETECTED Final   Giardia lamblia NOT DETECTED NOT DETECTED Final   Adenovirus F40/41 NOT DETECTED NOT DETECTED Final   Astrovirus NOT DETECTED NOT DETECTED Final   Norovirus GI/GII NOT DETECTED NOT DETECTED Final   Rotavirus A NOT DETECTED NOT DETECTED Final   Sapovirus (I, II, IV, and V) NOT DETECTED NOT DETECTED Final    Comment: Performed at [facility], [address], [place], [place] [address]    Radiology Studies: No results found.     Kani Jobson T. Alphonsus Doyel [facility]  If 7PM-7AM, please contact night-coverage [email or web address] 02/27/2023, 2:56 PM

## 2023-02-27 NOTE — Progress Notes (Signed)
PT Cancellation Note  Patient Details Name: Maria Fox MRN: [id number] DOB: 1946-02-02   Cancelled Treatment:    Reason Eval/Treat Not Completed: Pain limiting ability to participate, Patient declined and stated that she has a HA. Blanchard Kelch PT Acute Rehabilitation Services Office [phone] Weekend pager-[phone]    Rada Hay 02/27/2023, 12:43 PM

## 2023-02-27 NOTE — Progress Notes (Signed)
Mobility Specialist Cancellation Note:   Reason for Cancellation/Refusal: Pt declined mobility at this time d/t to "experiencing an intense HA". Will check back as schedule permits.    Arliss Journey Mobility Specialist Acute Rehabilitation Services Phone: [phone] 02/27/23, 3:08 PM

## 2023-02-28 DIAGNOSIS — R197 Diarrhea, unspecified: Secondary | ICD-10-CM | POA: Diagnosis not present

## 2023-02-28 DIAGNOSIS — I4892 Unspecified atrial flutter: Secondary | ICD-10-CM | POA: Diagnosis not present

## 2023-02-28 DIAGNOSIS — N3000 Acute cystitis without hematuria: Secondary | ICD-10-CM | POA: Diagnosis not present

## 2023-02-28 DIAGNOSIS — F411 Generalized anxiety disorder: Secondary | ICD-10-CM | POA: Diagnosis not present

## 2023-02-28 LAB — RENAL FUNCTION PANEL
Albumin: 3.1 g/dL — ABNORMAL LOW (ref 3.5–5.0)
Anion gap: 9 (ref 5–15)
BUN: 19 mg/dL (ref 8–23)
CO2: 24 mmol/L (ref 22–32)
Calcium: 8.7 mg/dL — ABNORMAL LOW (ref 8.9–10.3)
Chloride: 100 mmol/L (ref 98–111)
Creatinine, Ser: 1.06 mg/dL — ABNORMAL HIGH (ref 0.44–1.00)
GFR, Estimated: 53 mL/min — ABNORMAL LOW (ref >60)
Glucose, Bld: 275 mg/dL — ABNORMAL HIGH (ref 70–99)
Phosphorus: 2.7 mg/dL (ref 2.5–4.6)
Potassium: 4.2 mmol/L (ref 3.5–5.1)
Sodium: 133 mmol/L — ABNORMAL LOW (ref 135–145)

## 2023-02-28 LAB — CBC
HCT: 33.3 % — ABNORMAL LOW (ref 36.0–46.0)
Hemoglobin: 10.9 g/dL — ABNORMAL LOW (ref 12.0–15.0)
MCH: 29.7 pg (ref 26.0–34.0)
MCHC: 32.7 g/dL (ref 30.0–36.0)
MCV: 90.7 fL (ref 80.0–100.0)
Platelets: 211 10*3/uL (ref 150–400)
RBC: 3.67 MIL/uL — ABNORMAL LOW (ref 3.87–5.11)
RDW: 12.8 % (ref 11.5–15.5)
WBC: 6.7 10*3/uL (ref 4.0–10.5)
nRBC: 0 % (ref 0.0–0.2)

## 2023-02-28 LAB — RESPIRATORY PANEL BY PCR

## 2023-02-28 LAB — MAGNESIUM: Magnesium: 1.9 mg/dL (ref 1.7–2.4)

## 2023-02-28 LAB — CULTURE, BLOOD (ROUTINE X 2): Special Requests: ADEQUATE

## 2023-02-28 LAB — GLUCOSE, CAPILLARY
Glucose-Capillary: 263 mg/dL — ABNORMAL HIGH (ref 70–99)
Glucose-Capillary: 315 mg/dL — ABNORMAL HIGH (ref 70–99)
Glucose-Capillary: 142 mg/dL — ABNORMAL HIGH (ref 70–99)
Glucose-Capillary: 241 mg/dL — ABNORMAL HIGH (ref 70–99)

## 2023-02-28 LAB — SARS CORONAVIRUS 2 BY RT PCR: SARS Coronavirus 2 by RT PCR: NEGATIVE

## 2023-02-28 MED ORDER — INSULIN GLARGINE-YFGN 100 UNIT/ML ~~LOC~~ SOLN
25.0000 [IU] | Freq: Every day | SUBCUTANEOUS | Status: DC
  Administered 2023-02-28: 25 [IU] via SUBCUTANEOUS
  Filled 2023-02-28 (×2): qty 0.25

## 2023-02-28 MED ORDER — INSULIN ASPART 100 UNIT/ML IJ SOLN
4.0000 [IU] | Freq: Three times a day (TID) | INTRAMUSCULAR | Status: DC
  Administered 2023-02-28 – 2023-03-01 (×3): 4 [IU] via SUBCUTANEOUS

## 2023-02-28 NOTE — Progress Notes (Signed)
PROGRESS NOTE  Maria Fox MVH:[id number] DOB: Mar 31, 1945   PCP: Eustaquio Boyden, MD  Patient is from: Home.  DOA: 02/24/2023 LOS: 4  Chief complaints Chief Complaint  Patient presents with   Abdominal Pain   Weakness     Brief Narrative / Interim history: 77 year old F with PMH of CAD/CABG, atrial flutter on Eliquis, orthostatism without anemia, CKD-3A, diastolic CHF, DM-2, HLD, GERD, recurrent UTI with chronic symptoms, chronic diarrhea/abdominal pain and anxiety presenting with watery diarrhea for about 5 days, dysuria, nausea, dry heaving and generalized weakness and admitted with working diagnosis of urinary tract infection and diarrhea.  She has mild leukocytosis, tachycardia with UA strongly suggesting UTI.  CT abdomen and pelvis without acute finding.  Patient was admitted and started on IV ceftriaxone, IV fluids, antiemetics.  C. difficile and GIP negative.  Diarrhea symptoms improved.  Urine culture with Klebsiella pneumoniae with resistance to ampicillin and Macrobid.  Blood cultures NGTD.  Patient continues to endorse significant dysuria.  Discussed with on-call urology NP on 12/19, and he did not feel there anything to offer while inpatient but recommended outpatient follow-up as previously planned.   Subjective: Seen and examined earlier this morning.  No major events overnight of this morning.  Multiple complaints today.  She thinks she has fluid.  She complains generalized headache, body pain, "some" runny nose and throat.  Also reports pelvic pain and ongoing dysuria despite antibiotics.   Objective: Vitals:   02/27/23 1148 02/27/23 1933 02/28/23 0416 02/28/23 1149  BP: (!) 146/88 137/70 131/73 (!) 141/79  Pulse: 95 85 77 83  Resp: 17 20 19 16   Temp: 98.2 F (36.8 C) 97.6 F (36.4 C) 97.9 F (36.6 C) (!) 97.5 F (36.4 C)  TempSrc: Oral   Oral  SpO2: 96% 97% 95% 96%  Weight:      Height:        Examination:  GENERAL: No apparent distress.   Nontoxic. HEENT: MMM.  Vision and hearing grossly intact.  NECK: Supple.  No apparent JVD.  RESP:  No IWOB.  Fair aeration bilaterally. CVS:  RRR. Heart sounds normal.  ABD/GI/GU: BS+. Abd soft, NTND.  MSK/EXT:  Moves extremities. No apparent deformity. No edema.  SKIN: no apparent skin lesion or wound NEURO: Sleepy but wakes to voice.  Oriented appropriately.  No apparent focal neuro deficit. PSYCH: Calm. Normal affect.   Procedures:  None  Microbiology summarized: COVID-19, influenza and RSV PCR nonreactive Blood cultures NGTD Urine culture with Klebsiella pneumoniae. C. difficile negative GIP negative.  Assessment and plan: Sepsis due to urinary tract infection: POA.  Has leukocytosis with tachycardia.  UA and urine culture positive. Prior Klebsiella UTI in 08/2022 on 12/2022.  Continues to endorse significant dysuria despite appropriate antibiotics or Pyridium.  Worried about interstitial cystitis and possible vaginal atrophy.  CT abdomen and pelvis without acute finding.  Discussed with on-call urology NP on 12/19 but did not feel there anything to offer while inpatient but recommended outpatient follow-up as previously planned. -Received IV ceftriaxone for 3 days.  Will complete 5 days course with p.o. Keflex -Patient reports upcoming appointment with urology next month.   Acute on chronic diarrhea: Patient with history of recurrent diarrhea and autonomic dysfunction per GI in 2022.  EGD/Colo in 2021 with atrophy of gastric mucosa no polyps.  CT abdomen and pelvis without acute finding abdominal exam benign.  C. difficile and GIP negative.  Improved. -Advanced diet and tolerating. -Consider stopping metformin on discharge  CAD, multiple vessel  s/p CABG x 3: No cardiopulmonary symptoms. -Continue metoprolol, Imdur, atorvastatin, apixaban, Plavix and nitro   NIDDM-2 with hyperglycemia: A1c 9.3% on 10/18.  On metformin and glipizide at home. Recent Labs  Lab 02/27/23 1146  02/27/23 1640 02/27/23 2037 02/28/23 0808 02/28/23 1221  GLUCAP 366* 164* 141* 263* 315*  -Continue SSI-moderate -Increase Semglee to 25 units daily -Continue NovoLog 4 units 3 times daily with meals -Continue carb modified diet -Needs insulin on discharge.  Will discontinue metformin and glipizide on discharge. -Appreciate help by diabetic on later.  Flulike illness/headache: she reports myalgia, generalized headache, some runny nose and sore throat.  Her COVID, influenza and RSV PCR is nonreactive on admission. -Repeat COVID-19 test -Check full RVP. -Tylenol and tramadol as needed for pain   Paroxysmal atrial flutter: Rate controlled -Continue metoprolol and Eliquis -Optimize electrolytes   Persistent hypomagnesemia: Likely due to diarrhea -Monitor replenish as appropriate  CKD-3A: Stable -Monitor   Peripheral neuropathy -Continue gabapentin and duloxetine   History of CVA -Continue home meds   Essential hypertension -Continue metoprolol and Imdur  Generalized weakness: Likely due to acute illness.  Lives with friend.  Uses rolling walker as needed. -PT/OT-HH PT ordered.  Positive blood culture: Diphtheroids in 1 out of 2 bottles likely contaminant  Body mass index is 23.03 kg/m.          DVT prophylaxis:   apixaban (ELIQUIS) tablet 5 mg  Code Status: Full code Family Communication: None at bedside Level of care: Med-Surg Status is: Inpatient Remains inpatient appropriate because: UTI, generalized weakness, intractable headache   Final disposition: Likely home once medically stable Consultants:  Urology over the phone on 12/19  35 minutes with more than 50% spent in reviewing records, counseling patient/family and coordinating care.   Sch Meds:  Scheduled Meds:  apixaban  5 mg Oral BID   atorvastatin  40 mg Oral Daily   cephALEXin  500 mg Oral Q12H   clopidogrel  75 mg Oral Daily   dorzolamide-timolol  1 drop Both Eyes BID   DULoxetine  60 mg  Oral Daily   feeding supplement  237 mL Oral BID BM   gabapentin  300 mg Oral BID   insulin aspart  0-15 Units Subcutaneous TID WC   insulin aspart  0-5 Units Subcutaneous QHS   insulin aspart  4 Units Subcutaneous TID WC   insulin glargine-yfgn  25 Units Subcutaneous Daily   isosorbide mononitrate  15 mg Oral Daily   latanoprost  1 drop Both Eyes QHS   metoprolol succinate  25 mg Oral Daily   pantoprazole  40 mg Oral BID AC   Continuous Infusions:   PRN Meds:.acetaminophen **OR** acetaminophen, nitroGLYCERIN, ondansetron **OR** ondansetron (ZOFRAN) IV, traMADol  Antimicrobials: Anti-infectives (From admission, onward)    Start     Dose/Rate Route Frequency Ordered Stop   02/27/23 1345  cephALEXin (KEFLEX) capsule 500 mg        500 mg Oral Every 12 hours 02/27/23 1245 03/01/23 0959   02/25/23 2200  cefTRIAXone (ROCEPHIN) 1 g in sodium chloride 0.9 % 100 mL IVPB  Status:  Discontinued        1 g 200 mL/hr over 30 Minutes Intravenous Every 24 hours 02/25/23 1800 02/27/23 1245   02/24/23 2145  cefTRIAXone (ROCEPHIN) 1 g in sodium chloride 0.9 % 100 mL IVPB        1 g 200 mL/hr over 30 Minutes Intravenous  Once 02/24/23 2137 02/24/23 2354  I have personally reviewed the following labs and images: CBC: Recent Labs  Lab 02/24/23 1619 02/25/23 0408 02/26/23 0438 02/27/23 0506 02/28/23 0412  WBC 11.5* 5.8 5.6 6.2 6.7  NEUTROABS 8.1*  --   --   --   --   HGB 12.7 10.2* 9.8* 11.2* 10.9*  HCT 37.2 31.7* 31.0* 35.2* 33.3*  MCV 88.2 91.1 92.8 92.4 90.7  PLT 279 186 180 200 211   BMP &GFR Recent Labs  Lab 02/24/23 1619 02/25/23 0408 02/26/23 0438 02/27/23 0506 02/28/23 0412  NA 138 136 135 137 133*  K 3.9 4.0 3.8 4.3 4.2  CL 107 107 108 104 100  CO2 18* 21* 20* 25 24  GLUCOSE 182* 219* 240* 229* 275*  BUN 22 20 12 12 19   CREATININE 1.47* 1.29* 1.32* 1.22* 1.06*  CALCIUM 9.2 8.2* 8.2* 8.9 8.7*  MG 1.4* 1.6* 1.4* 1.5* 1.9  PHOS  --   --  3.4  --  2.7    Estimated Creatinine Clearance: 35.6 mL/min (A) (by C-G formula based on SCr of 1.06 mg/dL (H)). Liver & Pancreas: Recent Labs  Lab 02/24/23 1619 02/26/23 0438 02/28/23 0412  AST 21 15  --   ALT 12 10  --   ALKPHOS 60 60  --   BILITOT 1.0 0.3  --   PROT 7.4 5.8*  --   ALBUMIN 3.8 2.8* 3.1*   Recent Labs  Lab 02/24/23 1619  LIPASE 29   No results for input(s): "AMMONIA" in the last 168 hours. Diabetic: Recent Labs    02/27/23 0506  HGBA1C 9.6*   Recent Labs  Lab 02/27/23 1146 02/27/23 1640 02/27/23 2037 02/28/23 0808 02/28/23 1221  GLUCAP 366* 164* 141* 263* 315*   Cardiac Enzymes: No results for input(s): "CKTOTAL", "CKMB", "CKMBINDEX", "TROPONINI" in the last 168 hours. No results for input(s): "PROBNP" in the last 8760 hours. Coagulation Profile: No results for input(s): "INR", "PROTIME" in the last 168 hours. Thyroid Function Tests: Recent Labs    02/27/23 0506  TSH 1.196   Lipid Profile: No results for input(s): "CHOL", "HDL", "LDLCALC", "TRIG", "CHOLHDL", "LDLDIRECT" in the last 72 hours. Anemia Panel: Recent Labs    02/27/23 0506  VITAMINB12 460   Urine analysis:    Component Value Date/Time   COLORURINE YELLOW 02/24/2023 2103   APPEARANCEUR TURBID (A) 02/24/2023 2103   LABSPEC 1.017 02/24/2023 2103   PHURINE 5.0 02/24/2023 2103   GLUCOSEU NEGATIVE 02/24/2023 2103   HGBUR MODERATE (A) 02/24/2023 2103   BILIRUBINUR NEGATIVE 02/24/2023 2103   BILIRUBINUR 1+ 12/27/2022 1555   KETONESUR 5 (A) 02/24/2023 2103   PROTEINUR 100 (A) 02/24/2023 2103   UROBILINOGEN 0.2 12/27/2022 1555   UROBILINOGEN 0.2 09/16/2014 0009   NITRITE POSITIVE (A) 02/24/2023 2103   LEUKOCYTESUR LARGE (A) 02/24/2023 2103   Sepsis Labs: Invalid input(s): "PROCALCITONIN", "LACTICIDVEN"  Microbiology: Recent Results (from the past 240 hours)  Resp panel by RT-PCR (RSV, Flu A&B, Covid) Anterior Nasal Swab     Status: None   Collection Time: 02/24/23  4:53 PM    Specimen: Anterior Nasal Swab  Result Value Ref Range Status   SARS Coronavirus 2 by RT PCR NEGATIVE NEGATIVE Final    Comment: (NOTE) SARS-CoV-2 target nucleic acids are NOT DETECTED.  The SARS-CoV-2 RNA is generally detectable in upper respiratory specimens during the acute phase of infection. The lowest concentration of SARS-CoV-2 viral copies this assay can detect is 138 copies/mL. A negative result does not preclude SARS-Cov-2 infection and  should not be used as the sole basis for treatment or other patient management decisions. A negative result may occur with  improper specimen collection/handling, submission of specimen other than nasopharyngeal swab, presence of viral mutation(s) within the areas targeted by this assay, and inadequate number of viral copies(<138 copies/mL). A negative result must be combined with clinical observations, patient history, and epidemiological information. The expected result is Negative.  Fact Sheet for Patients:  BloggerCourse.com  Fact Sheet for Healthcare Providers:  SeriousBroker.it  This test is no t yet approved or cleared by the [place] FDA and  has been authorized for detection and/or diagnosis of SARS-CoV-2 by FDA under an Emergency Use Authorization (EUA). This EUA will remain  in effect (meaning this test can be used) for the duration of the COVID-19 declaration under Section 564(b)(1) of the Act, 21 U.S.C.section 360bbb-3(b)(1), unless the authorization is terminated  or revoked sooner.       Influenza A by PCR NEGATIVE NEGATIVE Final   Influenza B by PCR NEGATIVE NEGATIVE Final    Comment: (NOTE) The Xpert Xpress SARS-CoV-2/FLU/RSV plus assay is intended as an aid in the diagnosis of influenza from Nasopharyngeal swab specimens and should not be used as a sole basis for treatment. Nasal washings and aspirates are unacceptable for Xpert Xpress  SARS-CoV-2/FLU/RSV testing.  Fact Sheet for Patients: BloggerCourse.com  Fact Sheet for Healthcare Providers: SeriousBroker.it  This test is not yet approved or cleared by the [place] FDA and has been authorized for detection and/or diagnosis of SARS-CoV-2 by FDA under an Emergency Use Authorization (EUA). This EUA will remain in effect (meaning this test can be used) for the duration of the COVID-19 declaration under Section 564(b)(1) of the Act, 21 U.S.C. section 360bbb-3(b)(1), unless the authorization is terminated or revoked.     Resp Syncytial Virus by PCR NEGATIVE NEGATIVE Final    Comment: (NOTE) Fact Sheet for Patients: BloggerCourse.com  Fact Sheet for Healthcare Providers: SeriousBroker.it  This test is not yet approved or cleared by the [place] FDA and has been authorized for detection and/or diagnosis of SARS-CoV-2 by FDA under an Emergency Use Authorization (EUA). This EUA will remain in effect (meaning this test can be used) for the duration of the COVID-19 declaration under Section 564(b)(1) of the Act, 21 U.S.C. section 360bbb-3(b)(1), unless the authorization is terminated or revoked.  Performed at [facility], [address], [place], [place] [address]   Culture, [place] Urine     Status: Abnormal   Collection Time: 02/24/23  9:03 PM   Specimen: Urine, Random  Result Value Ref Range Status   Specimen Description   Final    URINE, RANDOM Performed at [facility], [address], [place], [place] [address]    Special Requests   Final    NONE Performed at [facility], [address] [place], [place] [address]    Culture >=100,000 COLONIES/mL KLEBSIELLA PNEUMONIAE (A)  Final   Report Status 02/27/2023 FINAL  Final   Organism ID, Bacteria KLEBSIELLA PNEUMONIAE (A)  Final       Susceptibility   Klebsiella pneumoniae - MIC*    AMPICILLIN RESISTANT Resistant     CEFEPIME <=0.12 SENSITIVE Sensitive     CEFTRIAXONE <=0.25 SENSITIVE Sensitive     CIPROFLOXACIN <=0.25 SENSITIVE Sensitive     GENTAMICIN <=1 SENSITIVE Sensitive     IMIPENEM <=0.25 SENSITIVE Sensitive     NITROFURANTOIN 64 INTERMEDIATE Intermediate     TRIMETH/SULFA <=20  SENSITIVE Sensitive     AMPICILLIN/SULBACTAM 4 SENSITIVE Sensitive     PIP/TAZO <=4 SENSITIVE Sensitive ug/mL    * >=100,000 COLONIES/mL KLEBSIELLA PNEUMONIAE  Blood culture (routine x 2)     Status: None (Preliminary result)   Collection Time: 02/24/23  9:55 PM   Specimen: BLOOD  Result Value Ref Range Status   Specimen Description   Final    BLOOD LEFT ANTECUBITAL Performed at [facility], [address], [place], [place] [address]    Special Requests   Final    BOTTLES DRAWN AEROBIC AND ANAEROBIC Blood Culture adequate volume Performed at [facility], [address], [place], [place] [address]    Culture   Final    NO GROWTH 3 DAYS Performed at [facility], [address], [place], [place] [address]    Report Status PENDING  Incomplete  Blood culture (routine x 2)     Status: Abnormal   Collection Time: 02/24/23 10:05 PM   Specimen: BLOOD RIGHT ARM  Result Value Ref Range Status   Specimen Description   Final    BLOOD RIGHT ARM Performed at [facility], [address], [place], [place] [address]    Special Requests   Final    BOTTLES DRAWN AEROBIC AND ANAEROBIC Blood Culture adequate volume Performed at [facility], [address] [place], [place] [address]    Culture  Setup Time   Final    GRAM POSITIVE RODS BOTTLES DRAWN AEROBIC ONLY CRITICAL RESULT CALLED TO, READ BACK BY AND VERIFIED WITH: C. ROBERTSON [id number]    Culture (A)  Final    DIPHTHEROIDS(CORYNEBACTERIUM SPECIES) Standardized susceptibility testing for this  organism is not available. Performed at [facility], [address] [place], [place] [address]    Report Status 02/28/2023 FINAL  Final  C Difficile Quick Screen w PCR reflex     Status: None   Collection Time: 02/25/23  4:08 AM   Specimen: STOOL  Result Value Ref Range Status   C Diff antigen NEGATIVE NEGATIVE Final   C Diff toxin NEGATIVE NEGATIVE Final   C Diff interpretation No C. difficile detected.  Final    Comment: Performed at [facility], [address] [place], [place] [address]  Gastrointestinal Panel by PCR , Stool     Status: None   Collection Time: 02/26/23  5:11 PM   Specimen: Stool  Result Value Ref Range Status   Campylobacter species NOT DETECTED NOT DETECTED Final   Plesimonas shigelloides NOT DETECTED NOT DETECTED Final   Salmonella species NOT DETECTED NOT DETECTED Final   Yersinia enterocolitica NOT DETECTED NOT DETECTED Final   Vibrio species NOT DETECTED NOT DETECTED Final   Vibrio cholerae NOT DETECTED NOT DETECTED Final   Enteroaggregative E coli (EAEC) NOT DETECTED NOT DETECTED Final   Enteropathogenic E coli (EPEC) NOT DETECTED NOT DETECTED Final   Enterotoxigenic E coli (ETEC) NOT DETECTED NOT DETECTED Final   Shiga like toxin producing E coli (STEC) NOT DETECTED NOT DETECTED Final   Shigella/Enteroinvasive E coli (EIEC) NOT DETECTED NOT DETECTED Final   Cryptosporidium NOT DETECTED NOT DETECTED Final   Cyclospora cayetanensis NOT DETECTED NOT DETECTED Final   Entamoeba histolytica NOT DETECTED NOT DETECTED Final   Giardia lamblia NOT DETECTED NOT DETECTED Final   Adenovirus F40/41 NOT DETECTED NOT DETECTED Final   Astrovirus NOT DETECTED NOT DETECTED Final   Norovirus GI/GII NOT DETECTED NOT  DETECTED Final   Rotavirus A NOT DETECTED NOT DETECTED Final   Sapovirus (I, II, IV, and V) NOT DETECTED NOT DETECTED Final    Comment: Performed at [facility], [address] [place], [place] [address]  SARS Coronavirus  2 by RT PCR (hospital order, performed in [facility] hospital lab) *cepheid single result test* Anterior Nasal Swab     Status: None   Collection Time: 02/28/23  9:13 AM   Specimen: Anterior Nasal Swab  Result Value Ref Range Status   SARS Coronavirus 2 by RT PCR NEGATIVE NEGATIVE Final    Comment: (NOTE) SARS-CoV-2 target nucleic acids are NOT DETECTED.  The SARS-CoV-2 RNA is generally detectable in upper and lower respiratory specimens during the acute phase of infection. The lowest concentration of SARS-CoV-2 viral copies this assay can detect is 250 copies / mL. A negative result does not preclude SARS-CoV-2 infection and should not be used as the sole basis for treatment or other patient management decisions.  A negative result may occur with improper specimen collection / handling, submission of specimen other than nasopharyngeal swab, presence of viral mutation(s) within the areas targeted by this assay, and inadequate number of viral copies (<250 copies / mL). A negative result must be combined with clinical observations, patient history, and epidemiological information.  Fact Sheet for Patients:   RoadLapTop.co.za  Fact Sheet for Healthcare Providers: http://kim-miller.com/  This test is not yet approved or  cleared by the [place] FDA and has been authorized for detection and/or diagnosis of SARS-CoV-2 by FDA under an Emergency Use Authorization (EUA).  This EUA will remain in effect (meaning this test can be used) for the duration of the COVID-19 declaration under Section 564(b)(1) of the Act, 21 U.S.C. section 360bbb-3(b)(1), unless the authorization is terminated or revoked sooner.  Performed at [facility], [address] [place], [place] [address]     Radiology Studies: No results found.     Sicily Zaragoza T. [facility]  If 7PM-7AM, please contact  night-coverage [email or web address] 02/28/2023, 2:27 PM

## 2023-02-28 NOTE — Inpatient Diabetes Management (Addendum)
Inpatient Diabetes Program Recommendations  AACE/ADA: New Consensus Statement on Inpatient Glycemic Control (2015)  Target Ranges:  Prepandial:   less than 140 mg/dL      Peak postprandial:   less than 180 mg/dL (1-2 hours)      Critically ill patients:  140 - 180 mg/dL    Latest Reference Range & Units 02/27/23 07:33 02/27/23 11:46 02/27/23 16:40 02/27/23 20:37  Glucose-Capillary 70 - 99 mg/dL 161 (H)  5 units Novolog @0915   15 units Semglee 366 (H)  15 units Novolog @1322  164 (H)  8 units Novolog @1817  141 (H)     15 units Semglee  (H): Data is abnormally high  Latest Reference Range & Units 02/28/23 08:08  Glucose-Capillary 70 - 99 mg/dL 096 (H)  8 units Novolog  25 units Semglee  (H): Data is abnormally high   Home DM Meds: Glipizide 10 mg BID + Metformin 1000 QAM   Current Orders: Novolog Moderate Correction Scale/ SSI (0-15 units) TID AC + HS                           Semglee 25 units daily      Novolog 4 units TID with meals       Consult received to have pt start insulin at home     MD- Please consider:   Based on my interaction with pt, I think we should keep the insulin regimen to once daily basal to keep it a simple regimen.  I would worry that pt could get confused with 2 different insulins and multiple doses.     Recommend for discharge: 1. Stop Glipizide and Metformin 2. Start Once daily Basal insulin for home 3. Start Tradjenta 5 mg daily (DPP-4 inhibitor)    Addendum 11:30am--Went to see pt today again to discuss going home on insulin and Re-review insulin pen.  Pt was lying in bed with a washcloth on her forehead and told me she was very sick and thinks she has the flu and asked me to come back later.  Addendum 2pm--Went back again to see pt and she was still too weak and not feeling well to have a conversation with me.  Will follow.    --Will follow patient during hospitalization--  Ambrose Finland RN, MSN, CDCES Diabetes  Coordinator Inpatient Glycemic Control Team Team Pager: [phone] (8a-5p)

## 2023-02-28 NOTE — Plan of Care (Signed)

## 2023-02-28 NOTE — Progress Notes (Signed)
PT Cancellation Note  Patient Details Name: Maria Fox MRN: [id number] DOB: Sep 01, 1945   Cancelled Treatment:    Reason Eval/Treat Not Completed: Other (comment) Pt declined due to not feeling well.   She reports has been up in room to restroom.  RN reports they are r/o the flu.  Will f/u as able.  Anise Salvo, PT Acute Rehab [facility] Rehab [phone]   Rayetta Humphrey 02/28/2023, 3:09 PM

## 2023-03-01 DIAGNOSIS — Z7901 Long term (current) use of anticoagulants: Secondary | ICD-10-CM

## 2023-03-01 DIAGNOSIS — R410 Disorientation, unspecified: Secondary | ICD-10-CM

## 2023-03-01 DIAGNOSIS — I10 Essential (primary) hypertension: Secondary | ICD-10-CM | POA: Diagnosis not present

## 2023-03-01 DIAGNOSIS — F411 Generalized anxiety disorder: Secondary | ICD-10-CM | POA: Diagnosis not present

## 2023-03-01 DIAGNOSIS — N3000 Acute cystitis without hematuria: Secondary | ICD-10-CM | POA: Diagnosis not present

## 2023-03-01 LAB — CBC
HCT: 34.5 % — ABNORMAL LOW (ref 36.0–46.0)
Hemoglobin: 11.2 g/dL — ABNORMAL LOW (ref 12.0–15.0)
MCH: 29.6 pg (ref 26.0–34.0)
MCHC: 32.5 g/dL (ref 30.0–36.0)
MCV: 91.3 fL (ref 80.0–100.0)
Platelets: 233 10*3/uL (ref 150–400)
RBC: 3.78 MIL/uL — ABNORMAL LOW (ref 3.87–5.11)
RDW: 13 % (ref 11.5–15.5)
WBC: 6 10*3/uL (ref 4.0–10.5)
nRBC: 0 % (ref 0.0–0.2)

## 2023-03-01 LAB — RENAL FUNCTION PANEL
Albumin: 3.2 g/dL — ABNORMAL LOW (ref 3.5–5.0)
Anion gap: 7 (ref 5–15)
BUN: 31 mg/dL — ABNORMAL HIGH (ref 8–23)
CO2: 26 mmol/L (ref 22–32)
Calcium: 9.2 mg/dL (ref 8.9–10.3)
Chloride: 102 mmol/L (ref 98–111)
Creatinine, Ser: 1.23 mg/dL — ABNORMAL HIGH (ref 0.44–1.00)
GFR, Estimated: 45 mL/min — ABNORMAL LOW (ref >60)
Glucose, Bld: 303 mg/dL — ABNORMAL HIGH (ref 70–99)
Phosphorus: 3.5 mg/dL (ref 2.5–4.6)
Potassium: 4.5 mmol/L (ref 3.5–5.1)
Sodium: 135 mmol/L (ref 135–145)

## 2023-03-01 LAB — GLUCOSE, CAPILLARY
Glucose-Capillary: 315 mg/dL — ABNORMAL HIGH (ref 70–99)
Glucose-Capillary: 251 mg/dL — ABNORMAL HIGH (ref 70–99)

## 2023-03-01 LAB — MAGNESIUM: Magnesium: 1.9 mg/dL (ref 1.7–2.4)

## 2023-03-01 MED ORDER — INSULIN GLARGINE 100 UNIT/ML SOLOSTAR PEN: 40.0000 [IU] | PEN_INJECTOR | Freq: Every day | SUBCUTANEOUS | 11 refills | Status: DC

## 2023-03-01 MED ORDER — BLOOD GLUCOSE MONITORING SUPPL DEVI: 1.0000 | Freq: Three times a day (TID) | 0 refills | Status: DC

## 2023-03-01 MED ORDER — LINAGLIPTIN 5 MG PO TABS: 5.0000 mg | ORAL_TABLET | Freq: Every day | ORAL | 0 refills | Status: DC

## 2023-03-01 MED ORDER — BLOOD GLUCOSE TEST VI STRP: 1.0000 | ORAL_STRIP | Freq: Three times a day (TID) | 0 refills | Status: AC

## 2023-03-01 MED ORDER — LANCETS MISC. MISC: 1.0000 | Freq: Three times a day (TID) | 0 refills | Status: AC

## 2023-03-01 MED ORDER — INSULIN ASPART 100 UNIT/ML IJ SOLN
5.0000 [IU] | Freq: Three times a day (TID) | INTRAMUSCULAR | Status: DC
  Administered 2023-03-01: 5 [IU] via SUBCUTANEOUS

## 2023-03-01 MED ORDER — LANCET DEVICE MISC: 1.0000 | Freq: Three times a day (TID) | 0 refills | Status: AC

## 2023-03-01 MED ORDER — INSULIN GLARGINE-YFGN 100 UNIT/ML ~~LOC~~ SOLN
35.0000 [IU] | Freq: Every day | SUBCUTANEOUS | Status: DC
  Administered 2023-03-01: 35 [IU] via SUBCUTANEOUS
  Filled 2023-03-01: qty 0.35

## 2023-03-01 MED ORDER — PEN NEEDLES 32G X 6 MM MISC: 0 refills | Status: DC

## 2023-03-01 MED ORDER — FLUCONAZOLE 150 MG PO TABS
150.0000 mg | ORAL_TABLET | Freq: Once | ORAL | Status: AC
  Administered 2023-03-01: 150 mg via ORAL
  Filled 2023-03-01: qty 1

## 2023-03-01 NOTE — Plan of Care (Signed)
Patient verbalized understanding of discharge instructions.  Problem: Education: Goal: Ability to describe self-care measures that may prevent or decrease complications (Diabetes Survival Skills Education) will improve 03/01/2023 1528 by Deneen Harts, RN Outcome: Adequate for Discharge 03/01/2023 1528 by Deneen Harts, RN Outcome: Adequate for Discharge Goal: Individualized Educational Video(s) 03/01/2023 1528 by Deneen Harts, RN Outcome: Adequate for Discharge 03/01/2023 1528 by Deneen Harts, RN Outcome: Adequate for Discharge   Problem: Coping: Goal: Ability to adjust to condition or change in health will improve 03/01/2023 1528 by Deneen Harts, RN Outcome: Adequate for Discharge 03/01/2023 1528 by Deneen Harts, RN Outcome: Adequate for Discharge   Problem: Fluid Volume: Goal: Ability to maintain a balanced intake and output will improve 03/01/2023 1528 by Deneen Harts, RN Outcome: Adequate for Discharge 03/01/2023 1528 by Deneen Harts, RN Outcome: Adequate for Discharge   Problem: Health Behavior/Discharge Planning: Goal: Ability to identify and utilize available resources and services will improve 03/01/2023 1528 by Deneen Harts, RN Outcome: Adequate for Discharge 03/01/2023 1528 by Deneen Harts, RN Outcome: Adequate for Discharge Goal: Ability to manage health-related needs will improve 03/01/2023 1528 by Deneen Harts, RN Outcome: Adequate for Discharge 03/01/2023 1528 by Deneen Harts, RN Outcome: Adequate for Discharge   Problem: Metabolic: Goal: Ability to maintain appropriate glucose levels will improve 03/01/2023 1528 by Deneen Harts, RN Outcome: Adequate for Discharge 03/01/2023 1528 by Deneen Harts, RN Outcome: Adequate for Discharge   Problem: Nutritional: Goal: Maintenance of adequate nutrition will improve 03/01/2023 1528 by Deneen Harts, RN Outcome:  Adequate for Discharge 03/01/2023 1528 by Deneen Harts, RN Outcome: Adequate for Discharge Goal: Progress toward achieving an optimal weight will improve 03/01/2023 1528 by Deneen Harts, RN Outcome: Adequate for Discharge 03/01/2023 1528 by Deneen Harts, RN Outcome: Adequate for Discharge   Problem: Skin Integrity: Goal: Risk for impaired skin integrity will decrease 03/01/2023 1528 by Deneen Harts, RN Outcome: Adequate for Discharge 03/01/2023 1528 by Deneen Harts, RN Outcome: Adequate for Discharge   Problem: Tissue Perfusion: Goal: Adequacy of tissue perfusion will improve 03/01/2023 1528 by Deneen Harts, RN Outcome: Adequate for Discharge 03/01/2023 1528 by Deneen Harts, RN Outcome: Adequate for Discharge   Problem: Education: Goal: Knowledge of General Education information will improve Description: Including pain rating scale, medication(s)/side effects and non-pharmacologic comfort measures 03/01/2023 1528 by Deneen Harts, RN Outcome: Adequate for Discharge 03/01/2023 1528 by Deneen Harts, RN Outcome: Adequate for Discharge   Problem: Health Behavior/Discharge Planning: Goal: Ability to manage health-related needs will improve 03/01/2023 1528 by Deneen Harts, RN Outcome: Adequate for Discharge 03/01/2023 1528 by Deneen Harts, RN Outcome: Adequate for Discharge   Problem: Clinical Measurements: Goal: Ability to maintain clinical measurements within normal limits will improve 03/01/2023 1528 by Deneen Harts, RN Outcome: Adequate for Discharge 03/01/2023 1528 by Deneen Harts, RN Outcome: Adequate for Discharge Goal: Will remain free from infection 03/01/2023 1528 by Deneen Harts, RN Outcome: Adequate for Discharge 03/01/2023 1528 by Deneen Harts, RN Outcome: Adequate for Discharge Goal: Diagnostic test results will improve 03/01/2023 1528 by Deneen Harts, RN Outcome: Adequate for Discharge 03/01/2023 1528 by Deneen Harts, RN Outcome: Adequate for Discharge Goal: Respiratory complications will improve 03/01/2023 1528 by Deneen Harts, RN Outcome: Adequate for Discharge 03/01/2023 1528 by Deneen Harts, RN Outcome: Adequate for Discharge Goal: Cardiovascular complication will be avoided 03/01/2023  1528 by Deneen Harts, RN Outcome: Adequate for Discharge 03/01/2023 1528 by Deneen Harts, RN Outcome: Adequate for Discharge   Problem: Activity: Goal: Risk for activity intolerance will decrease 03/01/2023 1528 by Deneen Harts, RN Outcome: Adequate for Discharge 03/01/2023 1528 by Deneen Harts, RN Outcome: Adequate for Discharge   Problem: Nutrition: Goal: Adequate nutrition will be maintained 03/01/2023 1528 by Deneen Harts, RN Outcome: Adequate for Discharge 03/01/2023 1528 by Deneen Harts, RN Outcome: Adequate for Discharge   Problem: Coping: Goal: Level of anxiety will decrease 03/01/2023 1528 by Deneen Harts, RN Outcome: Adequate for Discharge 03/01/2023 1528 by Deneen Harts, RN Outcome: Adequate for Discharge   Problem: Elimination: Goal: Will not experience complications related to bowel motility 03/01/2023 1528 by Deneen Harts, RN Outcome: Adequate for Discharge 03/01/2023 1528 by Deneen Harts, RN Outcome: Adequate for Discharge Goal: Will not experience complications related to urinary retention 03/01/2023 1528 by Deneen Harts, RN Outcome: Adequate for Discharge 03/01/2023 1528 by Deneen Harts, RN Outcome: Adequate for Discharge   Problem: Pain Management: Goal: General experience of comfort will improve 03/01/2023 1528 by Deneen Harts, RN Outcome: Adequate for Discharge 03/01/2023 1528 by Deneen Harts, RN Outcome: Adequate for Discharge   Problem: Safety: Goal: Ability to remain free from  injury will improve 03/01/2023 1528 by Deneen Harts, RN Outcome: Adequate for Discharge 03/01/2023 1528 by Deneen Harts, RN Outcome: Adequate for Discharge   Problem: Skin Integrity: Goal: Risk for impaired skin integrity will decrease 03/01/2023 1528 by Deneen Harts, RN Outcome: Adequate for Discharge 03/01/2023 1528 by Deneen Harts, RN Outcome: Adequate for Discharge

## 2023-03-01 NOTE — Discharge Summary (Signed)
Physician Discharge Summary  Maria Fox UJW:[id number] DOB: Dec 05, 1945 DOA: 02/24/2023  PCP: Eustaquio Boyden, MD  Admit date: 02/24/2023 Discharge date: 03/01/2023 Admitted From: Home Disposition: Home Recommendations for Outpatient Follow-up:  Follow up with PCP in 1 week Outpatient follow-up with urology as previously planned Reassess glycemic control, CMP and CBC at follow-up Please follow up on the following pending results: None  Home Health: HH PT Equipment/Devices: No need identified  Discharge Condition: Stable CODE STATUS: Full code  Follow-up Information     Eustaquio Boyden, MD. Schedule an appointment as soon as possible for a visit in 1 week(s).   Specialty: Family Medicine Contact information: [address] [place] [address] [phone]                 Hospital course 77 year old F with PMH of CAD/CABG, atrial flutter on Eliquis, orthostatism without anemia, CKD-3A, diastolic CHF, DM-2, HLD, GERD, recurrent UTI with chronic symptoms, chronic diarrhea/abdominal pain and anxiety presenting with watery diarrhea for about 5 days, dysuria, nausea, dry heaving and generalized weakness and admitted with working diagnosis of urinary tract infection and diarrhea.  She has mild leukocytosis, tachycardia with UA strongly suggesting UTI.  CT abdomen and pelvis without acute finding.  Patient was admitted and started on IV ceftriaxone, IV fluids, antiemetics.   C. difficile and GIP negative.  Diarrhea symptoms improved.  Urine culture with Klebsiella pneumoniae with resistance to ampicillin and Macrobid.  Blood cultures NGTD.  Patient continues to endorse significant dysuria.  Discussed with on-call urology NP on 12/19, and he did not feel there anything to offer while inpatient but recommended outpatient follow-up as previously planned. Patient completed 5 days of appropriate antibiotics for UTI.  Also received p.o. Diflucan 150 mg x 1 for possible  yeast infection.  She will follow-up with urology outpatient.  In regards to diabetes, discontinued metformin due to chronic diarrhea.  Started basal insulin at 45 units daily based on insulin requirement in house.  Also added Tradjenta and discontinued glipizide.  Gave prescriptions for supplies.  Reassess glycemic control and adjust meds as appropriate.  See individual problem list below for more.   Problems addressed during this hospitalization Sepsis due to urinary tract infection: POA.  Has leukocytosis with tachycardia.  UA and urine culture positive. Prior Klebsiella UTI in 08/2022 on 12/2022.  Continues to endorse significant dysuria despite appropriate antibiotics or Pyridium.  Worried about interstitial cystitis and possible vaginal atrophy.  CT abdomen and pelvis without acute finding.  Discussed with on-call urology NP on 12/19 but did not feel there anything to offer while inpatient but recommended outpatient follow-up as previously planned. -Received IV ceftriaxone for 3 days and p.o. Keflex for 2 more days to complete a total of 5 days course. -Patient reports upcoming appointment with urology next month.   Acute on chronic diarrhea: Patient with history of recurrent diarrhea and autonomic dysfunction per GI in 2022.  EGD/Colo in 2021 with atrophy of gastric mucosa no polyps.  CT abdomen and pelvis without acute finding abdominal exam benign.  C. difficile and GIP negative.  Resolved.  Discontinued metformin on discharge.   CAD, multiple vessel s/p CABG x 3: No cardiopulmonary symptoms. -Continue metoprolol, Imdur, atorvastatin, apixaban, Plavix and nitro   NIDDM-2 with hyperglycemia: A1c 9.3% on 10/18.  On metformin and glipizide at home. -Discontinued metformin due to chronic diarrhea -Discharged on Lantus 45 units daily based on her insulin requirement while in-house -Discontinued glipizide due to risk of hypoglycemia  with insulin -Also prescribed Tradjenta 5 mg  daily -Prescribed diabetic supplies -Reassess glycemic control outpatient   Flulike illness/headache: she reports myalgia, generalized headache, some runny nose and sore throat.  Repeat RVP negative.  CBC without significant finding.    Paroxysmal atrial flutter: Rate controlled -Continue metoprolol and Eliquis   Persistent hypomagnesemia: Resolved   CKD-3A: Stable -Monitor   Peripheral neuropathy -Continue gabapentin and duloxetine   History of CVA -Continue home meds   Essential hypertension -Continue metoprolol and Imdur   Generalized weakness: Likely due to acute illness.  Lives with friend.  Uses rolling walker as needed. -PT/OT-HH PT ordered.   Positive blood culture: Diphtheroids in 1 out of 2 bottles likely contaminant  Possible yeast infection -P.o. Diflucan 150 mg x 1              Time spent 35 minutes  Vital signs Vitals:   02/28/23 1149 02/28/23 1915 03/01/23 0616 03/01/23 1200  BP: (!) 141/79 118/66 138/74 (!) 149/85  Pulse: 83 81 71 82  Temp: (!) 97.5 F (36.4 C) 97.8 F (36.6 C) 97.6 F (36.4 C) 97.8 F (36.6 C)  Resp: 16 19 17 18   Height:      Weight:      SpO2: 96% 97% 94% 97%  TempSrc: Oral Oral  Oral  BMI (Calculated):         Discharge exam  GENERAL: No apparent distress.  Nontoxic. HEENT: MMM.  Vision and hearing grossly intact.  NECK: Supple.  No apparent JVD.  RESP:  No IWOB.  Fair aeration bilaterally. CVS:  RRR. Heart sounds normal.  ABD/GI/GU: BS+. Abd soft, NTND.  MSK/EXT:  Moves extremities. No apparent deformity. No edema.  SKIN: no apparent skin lesion or wound NEURO: Awake and alert. Oriented appropriately.  No apparent focal neuro deficit. PSYCH: Calm. Normal affect.   Discharge Instructions Discharge Instructions     Diet - low sodium heart healthy   Complete by: As directed    Diet Carb Modified   Complete by: As directed    Discharge instructions   Complete by: As directed    It has been a pleasure  taking care of you!  You were hospitalized due to urinary tract infection for which you have completed antibiotic course.  Follow-up with your urologist for further evaluation and management of pain with urination.   In regards to your diabetes, we have stopped your metformin and glipizide and started you on insulin.  Sherron Monday can increase your risk of diarrhea.  Please monitor your blood glucose and use your insulin as prescribed. See separate instruction about management of diabetes and hypoglycemia.   Take care,   Increase activity slowly   Complete by: As directed       Allergies as of 03/01/2023       Reactions   Bee Venom Anaphylaxis, Swelling, Other (See Comments)   "Throat swelling"   Mushroom Anaphylaxis   Mushroom Extract Complex (do Not Select) Anaphylaxis   Penicillins Anaphylaxis, Hives, Swelling   Tolerates cephalosporins including cephalexin multiple times.  Has patient had a PCN reaction causing immediate rash, facial/tongue/throat swelling, SOB or lightheadedness with hypotension: Yes Has patient had a PCN reaction causing severe rash involving mucus membranes or skin necrosis: Yes Has patient had a PCN reaction that required hospitalization Yes Has patient had a PCN reaction occurring within the last 10 years: No   Codeine Nausea And Vomiting   Sulfa Antibiotics Nausea And Vomiting   Iohexol Itching, Swelling  Erythromycin Base Rash        Medication List     STOP taking these medications    glipiZIDE 10 MG tablet Commonly known as: GLUCOTROL   metFORMIN 500 MG 24 hr tablet Commonly known as: GLUCOPHAGE-XR   phenazopyridine 100 MG tablet Commonly known as: PYRIDIUM       TAKE these medications    acyclovir ointment 5 % Commonly known as: ZOVIRAX Apply 1 Application topically every 3 (three) hours as needed (for fever blisters).   albuterol 108 (90 Base) MCG/ACT inhaler Commonly known as: ProAir HFA Inhale 2 puffs into the lungs every 6 (six)  hours as needed for wheezing or shortness of breath.   apixaban 5 MG Tabs tablet Commonly known as: Eliquis Take 1 tablet (5 mg total) by mouth 2 (two) times daily.   atorvastatin 40 MG tablet Commonly known as: LIPITOR TAKE 1 TABLET BY MOUTH EVERY EVENING What changed:  how much to take how to take this when to take this additional instructions   Blood Glucose Monitoring Suppl Devi 1 each by Does not apply route in the morning, at noon, and at bedtime. May substitute to any manufacturer covered by patient's insurance.   BLOOD GLUCOSE TEST STRIPS Strp 1 each by In Vitro route in the morning, at noon, and at bedtime. May substitute to any manufacturer covered by patient's insurance.   clopidogrel 75 MG tablet Commonly known as: PLAVIX Take 1 tablet (75 mg total) by mouth daily.   dorzolamide-timolol 2-0.5 % ophthalmic solution Commonly known as: COSOPT Place 1 drop into both eyes 2 (two) times daily.   DULoxetine 60 MG capsule Commonly known as: CYMBALTA Take 1 capsule (60 mg total) by mouth daily.   gabapentin 300 MG capsule Commonly known as: NEURONTIN TAKE ONE CAPSULE BY MOUTH TWICE DAILY What changed: when to take this   insulin glargine 100 UNIT/ML Solostar Pen Commonly known as: LANTUS Inject 40 Units into the skin daily.   isosorbide mononitrate 30 MG 24 hr tablet Commonly known as: IMDUR TAKE 1/2 TABLET BY MOUTH ONCE DAILY What changed:  how much to take how to take this when to take this additional instructions   Lancet Device Misc 1 each by Does not apply route in the morning, at noon, and at bedtime. May substitute to any manufacturer covered by patient's insurance.   Lancets Misc. Misc 1 each by Does not apply route in the morning, at noon, and at bedtime. May substitute to any manufacturer covered by patient's insurance.   latanoprost 0.005 % ophthalmic solution Commonly known as: XALATAN Place 1 drop into both eyes at bedtime.   linagliptin 5  MG Tabs tablet Commonly known as: Tradjenta Take 1 tablet (5 mg total) by mouth daily.   metoprolol succinate 25 MG 24 hr tablet Commonly known as: Toprol XL Take 1 tablet (25 mg total) by mouth daily.   nitroGLYCERIN 0.4 MG SL tablet Commonly known as: NITROSTAT Place 1 tablet (0.4 mg total) under the tongue every 5 (five) minutes as needed for chest pain.   ondansetron 4 MG tablet Commonly known as: ZOFRAN Take 1 tablet (4 mg total) by mouth every 8 (eight) hours as needed for nausea or vomiting.   pantoprazole 40 MG tablet Commonly known as: PROTONIX TAKE 1 TABLET BY MOUTH TWICE DAILY *REFILL REQUEST* What changed: See the new instructions.   Pen Needles 32G X 6 MM Misc Use 1 pen needle daily to inject insulin.   traMADol 50 MG tablet  Commonly known as: ULTRAM Take 50 mg by mouth every 6 (six) hours as needed for moderate pain (pain score 4-6).   TYLENOL 500 MG tablet Generic drug: acetaminophen Take 500-1,000 mg by mouth every 6 (six) hours as needed for mild pain or headache.        Consultations: Urology over the phone  Procedures/Studies:   MR BRAIN WO CONTRAST Result Date: 02/26/2023 CLINICAL DATA:  Encephalopathy, chronic headaches EXAM: MRI HEAD WITHOUT CONTRAST TECHNIQUE: Multiplanar, multiecho pulse sequences of the brain and surrounding structures were obtained without intravenous contrast. COMPARISON:  07/18/2019 MRI head FINDINGS: Brain: No restricted diffusion to suggest acute or subacute infarct. No acute hemorrhage, mass, mass effect, or midline shift. No hydrocephalus or extra-axial collection. Pituitary and craniocervical junction within normal limits. No hemosiderin deposition to suggest remote hemorrhage. Remote left occipital cortical infarct. Remote lacunar infarcts in the left thalamus, basal ganglia, and corona radiata. T2 hyperintense signal in the periventricular white matter, likely the sequela of chronic small vessel ischemic disease. Mildly  advanced cerebral volume loss for age. Vascular: Redemonstrated stenosis of the right V4. Otherwise normal arterial flow voids. Skull and upper cervical spine: Normal marrow signal. Sinuses/Orbits: Clear paranasal sinuses. No acute finding in the orbits. Status post bilateral lens replacements. Other: The mastoid air cells are well aerated. IMPRESSION: No acute intracranial process. No evidence of acute or subacute infarct. Electronically Signed   By: Wiliam Ke M.D.   On: 02/26/2023 19:07   CT ABDOMEN PELVIS WO CONTRAST Result Date: 02/24/2023 CLINICAL DATA:  Possible sepsis. Abdominal pain. UTI like symptoms for extended period of time. EXAM: CT ABDOMEN AND PELVIS WITHOUT CONTRAST TECHNIQUE: Multidetector CT imaging of the abdomen and pelvis was performed following the standard protocol without IV contrast. RADIATION DOSE REDUCTION: This exam was performed according to the departmental dose-optimization program which includes automated exposure control, adjustment of the mA and/or kV according to patient size and/or use of iterative reconstruction technique. COMPARISON:  CT abdomen and pelvis 07/21/2022 FINDINGS: Lower chest: No acute abnormality. Hepatobiliary: No focal liver abnormality is seen. Status post cholecystectomy. No biliary dilatation. Pancreas: Unremarkable. No pancreatic ductal dilatation or surrounding inflammatory changes. Spleen: No acute abnormality. Adrenals/Urinary Tract: Stable adrenal glands and kidneys. No urinary calculi or hydronephrosis. Unremarkable bladder. Stomach/Bowel: No bowel obstruction or bowel wall thickening. Colonic diverticulosis without diverticulitis. Stomach and appendix are within normal limits. Vascular/Lymphatic: Aortic atherosclerosis. No enlarged abdominal or pelvic lymph nodes. Reproductive: No acute abnormality. Other: No free intraperitoneal fluid or air. Musculoskeletal: No acute fracture. IMPRESSION: No acute abnormality in the abdomen or pelvis. Aortic  Atherosclerosis (ICD10-I70.0). Electronically Signed   By: Minerva Fester M.D.   On: 02/24/2023 19:14   DG Chest Portable 1 View Result Date: 02/24/2023 CLINICAL DATA:  Abdominal pain and weakness. EXAM: PORTABLE CHEST 1 VIEW COMPARISON:  March 01, 2022 FINDINGS: Multiple sternal wires and vascular clips are seen. The heart size and mediastinal contours are within normal limits. A radiopaque the first order is in place. Mild atelectasis is seen within the retrocardiac region of the left lung base. No pleural effusion or pneumothorax is identified. The visualized skeletal structures are unremarkable. IMPRESSION: 1. Evidence of prior median sternotomy/CABG. 2. Mild left basilar atelectasis. Electronically Signed   By: Aram Candela M.D.   On: 02/24/2023 18:34       The results of significant diagnostics from this hospitalization (including imaging, microbiology, ancillary and laboratory) are listed below for reference.     Microbiology: Recent Results (from the  past 240 hours)  Resp panel by RT-PCR (RSV, Flu A&B, Covid) Anterior Nasal Swab     Status: None   Collection Time: 02/24/23  4:53 PM   Specimen: Anterior Nasal Swab  Result Value Ref Range Status   SARS Coronavirus 2 by RT PCR NEGATIVE NEGATIVE Final    Comment: (NOTE) SARS-CoV-2 target nucleic acids are NOT DETECTED.  The SARS-CoV-2 RNA is generally detectable in upper respiratory specimens during the acute phase of infection. The lowest concentration of SARS-CoV-2 viral copies this assay can detect is 138 copies/mL. A negative result does not preclude SARS-Cov-2 infection and should not be used as the sole basis for treatment or other patient management decisions. A negative result may occur with  improper specimen collection/handling, submission of specimen other than nasopharyngeal swab, presence of viral mutation(s) within the areas targeted by this assay, and inadequate number of viral copies(<138 copies/mL). A  negative result must be combined with clinical observations, patient history, and epidemiological information. The expected result is Negative.  Fact Sheet for Patients:  BloggerCourse.com  Fact Sheet for Healthcare Providers:  SeriousBroker.it  This test is no t yet approved or cleared by the [place] FDA and  has been authorized for detection and/or diagnosis of SARS-CoV-2 by FDA under an Emergency Use Authorization (EUA). This EUA will remain  in effect (meaning this test can be used) for the duration of the COVID-19 declaration under Section 564(b)(1) of the Act, 21 U.S.C.section 360bbb-3(b)(1), unless the authorization is terminated  or revoked sooner.       Influenza A by PCR NEGATIVE NEGATIVE Final   Influenza B by PCR NEGATIVE NEGATIVE Final    Comment: (NOTE) The Xpert Xpress SARS-CoV-2/FLU/RSV plus assay is intended as an aid in the diagnosis of influenza from Nasopharyngeal swab specimens and should not be used as a sole basis for treatment. Nasal washings and aspirates are unacceptable for Xpert Xpress SARS-CoV-2/FLU/RSV testing.  Fact Sheet for Patients: BloggerCourse.com  Fact Sheet for Healthcare Providers: SeriousBroker.it  This test is not yet approved or cleared by the [place] FDA and has been authorized for detection and/or diagnosis of SARS-CoV-2 by FDA under an Emergency Use Authorization (EUA). This EUA will remain in effect (meaning this test can be used) for the duration of the COVID-19 declaration under Section 564(b)(1) of the Act, 21 U.S.C. section 360bbb-3(b)(1), unless the authorization is terminated or revoked.     Resp Syncytial Virus by PCR NEGATIVE NEGATIVE Final    Comment: (NOTE) Fact Sheet for Patients: BloggerCourse.com  Fact Sheet for Healthcare  Providers: SeriousBroker.it  This test is not yet approved or cleared by the [place] FDA and has been authorized for detection and/or diagnosis of SARS-CoV-2 by FDA under an Emergency Use Authorization (EUA). This EUA will remain in effect (meaning this test can be used) for the duration of the COVID-19 declaration under Section 564(b)(1) of the Act, 21 U.S.C. section 360bbb-3(b)(1), unless the authorization is terminated or revoked.  Performed at [facility], [address] [place], [place] [address]   Culture, [place] Urine     Status: Abnormal   Collection Time: 02/24/23  9:03 PM   Specimen: Urine, Random  Result Value Ref Range Status   Specimen Description   Final    URINE, RANDOM Performed at [facility], [address] [place], [place] [address]    Special Requests   Final    NONE Performed at [facility], [address]  [place], [place] [address]    Culture >=100,000 COLONIES/mL KLEBSIELLA PNEUMONIAE (A)  Final   Report Status 02/27/2023 FINAL  Final   Organism ID, Bacteria KLEBSIELLA PNEUMONIAE (A)  Final      Susceptibility   Klebsiella pneumoniae - MIC*    AMPICILLIN RESISTANT Resistant     CEFEPIME <=0.12 SENSITIVE Sensitive     CEFTRIAXONE <=0.25 SENSITIVE Sensitive     CIPROFLOXACIN <=0.25 SENSITIVE Sensitive     GENTAMICIN <=1 SENSITIVE Sensitive     IMIPENEM <=0.25 SENSITIVE Sensitive     NITROFURANTOIN 64 INTERMEDIATE Intermediate     TRIMETH/SULFA <=20 SENSITIVE Sensitive     AMPICILLIN/SULBACTAM 4 SENSITIVE Sensitive     PIP/TAZO <=4 SENSITIVE Sensitive ug/mL    * >=100,000 COLONIES/mL KLEBSIELLA PNEUMONIAE  Blood culture (routine x 2)     Status: None (Preliminary result)   Collection Time: 02/24/23  9:55 PM   Specimen: BLOOD  Result Value Ref Range Status   Specimen Description   Final    BLOOD LEFT ANTECUBITAL Performed at [facility], [address] [place], [place] [address]    Special Requests   Final    BOTTLES DRAWN AEROBIC AND ANAEROBIC Blood Culture adequate volume Performed at [facility], [address], [place], [place] [address]    Culture   Final    NO GROWTH 4 DAYS Performed at [facility], [address], [place], [place] [address]    Report Status PENDING  Incomplete  Blood culture (routine x 2)     Status: Abnormal   Collection Time: 02/24/23 10:05 PM   Specimen: BLOOD RIGHT ARM  Result Value Ref Range Status   Specimen Description   Final    BLOOD RIGHT ARM Performed at [facility], [address] [place], [place] [address]    Special Requests   Final    BOTTLES DRAWN AEROBIC AND ANAEROBIC Blood Culture adequate volume Performed at [facility], [address] [place], [place] [address]    Culture  Setup Time   Final    GRAM POSITIVE RODS BOTTLES DRAWN AEROBIC ONLY CRITICAL RESULT CALLED TO, READ BACK BY AND VERIFIED WITH: C. ROBERTSON [id number] FH    Culture (A)  Final    DIPHTHEROIDS(CORYNEBACTERIUM SPECIES) Standardized susceptibility testing for this organism is not available. Performed at [facility], [address], [place], [place] [address]    Report Status 02/28/2023 FINAL  Final  C Difficile Quick Screen w PCR reflex     Status: None   Collection Time: 02/25/23  4:08 AM   Specimen: STOOL  Result Value Ref Range Status   C Diff antigen NEGATIVE NEGATIVE Final   C Diff toxin NEGATIVE NEGATIVE Final   C Diff interpretation No C. difficile detected.  Final    Comment: Performed at [facility], [address] [place], [place] [address]  Gastrointestinal Panel by PCR , Stool     Status: None   Collection Time: 02/26/23  5:11 PM   Specimen: Stool  Result Value Ref Range Status   Campylobacter species NOT DETECTED NOT DETECTED Final   Plesimonas shigelloides NOT DETECTED NOT DETECTED Final    Salmonella species NOT DETECTED NOT DETECTED Final   Yersinia enterocolitica NOT DETECTED NOT DETECTED Final   Vibrio species NOT DETECTED NOT DETECTED Final   Vibrio cholerae NOT DETECTED NOT DETECTED Final   Enteroaggregative E coli (EAEC) NOT DETECTED NOT DETECTED Final  Enteropathogenic E coli (EPEC) NOT DETECTED NOT DETECTED Final   Enterotoxigenic E coli (ETEC) NOT DETECTED NOT DETECTED Final   Shiga like toxin producing E coli (STEC) NOT DETECTED NOT DETECTED Final   Shigella/Enteroinvasive E coli (EIEC) NOT DETECTED NOT DETECTED Final   Cryptosporidium NOT DETECTED NOT DETECTED Final   Cyclospora cayetanensis NOT DETECTED NOT DETECTED Final   Entamoeba histolytica NOT DETECTED NOT DETECTED Final   Giardia lamblia NOT DETECTED NOT DETECTED Final   Adenovirus F40/41 NOT DETECTED NOT DETECTED Final   Astrovirus NOT DETECTED NOT DETECTED Final   Norovirus GI/GII NOT DETECTED NOT DETECTED Final   Rotavirus A NOT DETECTED NOT DETECTED Final   Sapovirus (I, II, IV, and V) NOT DETECTED NOT DETECTED Final    Comment: Performed at [facility], [address] [place], [place] [address]  Respiratory (~20 pathogens) panel by PCR     Status: None   Collection Time: 02/28/23  9:13 AM   Specimen: Nasopharyngeal Swab; Respiratory  Result Value Ref Range Status   Adenovirus NOT DETECTED NOT DETECTED Final   Coronavirus 229E NOT DETECTED NOT DETECTED Final    Comment: (NOTE) The Coronavirus on the Respiratory Panel, DOES NOT test for the novel  Coronavirus (2019 nCoV)    Coronavirus HKU1 NOT DETECTED NOT DETECTED Final   Coronavirus NL63 NOT DETECTED NOT DETECTED Final   Coronavirus OC43 NOT DETECTED NOT DETECTED Final   Metapneumovirus NOT DETECTED NOT DETECTED Final   Rhinovirus / Enterovirus NOT DETECTED NOT DETECTED Final   Influenza A NOT DETECTED NOT DETECTED Final   Influenza B NOT DETECTED NOT DETECTED Final   Parainfluenza Virus 1 NOT DETECTED NOT DETECTED Final    Parainfluenza Virus 2 NOT DETECTED NOT DETECTED Final   Parainfluenza Virus 3 NOT DETECTED NOT DETECTED Final   Parainfluenza Virus 4 NOT DETECTED NOT DETECTED Final   Respiratory Syncytial Virus NOT DETECTED NOT DETECTED Final   Bordetella pertussis NOT DETECTED NOT DETECTED Final   Bordetella Parapertussis NOT DETECTED NOT DETECTED Final   Chlamydophila pneumoniae NOT DETECTED NOT DETECTED Final   Mycoplasma pneumoniae NOT DETECTED NOT DETECTED Final    Comment: Performed at [facility], [address], [place], [place] [address]  SARS Coronavirus 2 by RT PCR (hospital order, performed in [facility] hospital lab) *cepheid single result test* Anterior Nasal Swab     Status: None   Collection Time: 02/28/23  9:13 AM   Specimen: Anterior Nasal Swab  Result Value Ref Range Status   SARS Coronavirus 2 by RT PCR NEGATIVE NEGATIVE Final    Comment: (NOTE) SARS-CoV-2 target nucleic acids are NOT DETECTED.  The SARS-CoV-2 RNA is generally detectable in upper and lower respiratory specimens during the acute phase of infection. The lowest concentration of SARS-CoV-2 viral copies this assay can detect is 250 copies / mL. A negative result does not preclude SARS-CoV-2 infection and should not be used as the sole basis for treatment or other patient management decisions.  A negative result may occur with improper specimen collection / handling, submission of specimen other than nasopharyngeal swab, presence of viral mutation(s) within the areas targeted by this assay, and inadequate number of viral copies (<250 copies / mL). A negative result must be combined with clinical observations, patient history, and epidemiological information.  Fact Sheet for Patients:   RoadLapTop.co.za  Fact Sheet for Healthcare Providers: http://kim-miller.com/  This test is not yet approved or  cleared by the [place] FDA and has been  authorized for  detection and/or diagnosis of SARS-CoV-2 by FDA under an Emergency Use Authorization (EUA).  This EUA will remain in effect (meaning this test can be used) for the duration of the COVID-19 declaration under Section 564(b)(1) of the Act, 21 U.S.C. section 360bbb-3(b)(1), unless the authorization is terminated or revoked sooner.  Performed at [facility], [address] [place], [place] [address]      Labs:  CBC: Recent Labs  Lab 02/24/23 1619 02/25/23 0408 02/26/23 0438 02/27/23 0506 02/28/23 0412 03/01/23 0506  WBC 11.5* 5.8 5.6 6.2 6.7 6.0  NEUTROABS 8.1*  --   --   --   --   --   HGB 12.7 10.2* 9.8* 11.2* 10.9* 11.2*  HCT 37.2 31.7* 31.0* 35.2* 33.3* 34.5*  MCV 88.2 91.1 92.8 92.4 90.7 91.3  PLT 279 186 180 200 211 233   BMP &GFR Recent Labs  Lab 02/25/23 0408 02/26/23 0438 02/27/23 0506 02/28/23 0412 03/01/23 0506  NA 136 135 137 133* 135  K 4.0 3.8 4.3 4.2 4.5  CL 107 108 104 100 102  CO2 21* 20* 25 24 26   GLUCOSE 219* 240* 229* 275* 303*  BUN 20 12 12 19  31*  CREATININE 1.29* 1.32* 1.22* 1.06* 1.23*  CALCIUM 8.2* 8.2* 8.9 8.7* 9.2  MG 1.6* 1.4* 1.5* 1.9 1.9  PHOS  --  3.4  --  2.7 3.5   Estimated Creatinine Clearance: 30.7 mL/min (A) (by C-G formula based on SCr of 1.23 mg/dL (H)). Liver & Pancreas: Recent Labs  Lab 02/24/23 1619 02/26/23 0438 02/28/23 0412 03/01/23 0506  AST 21 15  --   --   ALT 12 10  --   --   ALKPHOS 60 60  --   --   BILITOT 1.0 0.3  --   --   PROT 7.4 5.8*  --   --   ALBUMIN 3.8 2.8* 3.1* 3.2*   Recent Labs  Lab 02/24/23 1619  LIPASE 29   No results for input(s): "AMMONIA" in the last 168 hours. Diabetic: Recent Labs    02/27/23 0506  HGBA1C 9.6*   Recent Labs  Lab 02/28/23 1221 02/28/23 1641 02/28/23 2130 03/01/23 0744 03/01/23 1148  GLUCAP 315* 142* 241* 315* 251*   Cardiac Enzymes: No results for input(s): "CKTOTAL", "CKMB", "CKMBINDEX", "TROPONINI" in the last 168 hours. No  results for input(s): "PROBNP" in the last 8760 hours. Coagulation Profile: No results for input(s): "INR", "PROTIME" in the last 168 hours. Thyroid Function Tests: Recent Labs    02/27/23 0506  TSH 1.196   Lipid Profile: No results for input(s): "CHOL", "HDL", "LDLCALC", "TRIG", "CHOLHDL", "LDLDIRECT" in the last 72 hours. Anemia Panel: Recent Labs    02/27/23 0506  VITAMINB12 460   Urine analysis:    Component Value Date/Time   COLORURINE YELLOW 02/24/2023 2103   APPEARANCEUR TURBID (A) 02/24/2023 2103   LABSPEC 1.017 02/24/2023 2103   PHURINE 5.0 02/24/2023 2103   GLUCOSEU NEGATIVE 02/24/2023 2103   HGBUR MODERATE (A) 02/24/2023 2103   BILIRUBINUR NEGATIVE 02/24/2023 2103   BILIRUBINUR 1+ 12/27/2022 1555   KETONESUR 5 (A) 02/24/2023 2103   PROTEINUR 100 (A) 02/24/2023 2103   UROBILINOGEN 0.2 12/27/2022 1555   UROBILINOGEN 0.2 09/16/2014 0009   NITRITE POSITIVE (A) 02/24/2023 2103   LEUKOCYTESUR LARGE (A) 02/24/2023 2103   Sepsis Labs: Invalid input(s): "PROCALCITONIN", "LACTICIDVEN"   SIGNED:  Almon Hercules, MD  [facility] 03/01/2023, 4:26 PM

## 2023-03-01 NOTE — Plan of Care (Signed)

## 2023-03-02 LAB — CULTURE, BLOOD (ROUTINE X 2)
Culture: NO GROWTH
Special Requests: ADEQUATE

## 2023-03-03 ENCOUNTER — Telehealth: Payer: Self-pay | Admitting: *Deleted

## 2023-03-03 NOTE — Transitions of Care (Post Inpatient/ED Visit) (Signed)
03/03/2023  Name: Maria Fox MRN: [id number] DOB: 03-Jul-1945  Today's TOC FU Call Status: Today's TOC FU Call Status:: Successful TOC FU Call Completed TOC FU Call Complete Date: 03/03/23 Patient's Name and Date of Birth confirmed.  Transition Care Management Follow-up Telephone Call Date of Discharge: 03/01/23 Discharge Facility: [facility]) Type of Discharge: Inpatient Admission Primary Inpatient Discharge Diagnosis:: Urinary tract infection How have you been since you were released from the hospital?: Better Any questions or concerns?: Yes Patient Questions/Concerns:: Patient is wanting to know if she can get some help getting into a facility Patient Questions/Concerns Addressed: Other: (referred to social worker)  Items Reviewed: Did you receive and understand the discharge instructions provided?: Yes Medications obtained,verified, and reconciled?: Yes (Medications Reviewed) Any new allergies since your discharge?: No Dietary orders reviewed?: Yes Type of Diet Ordered:: low carb heart healthy Do you have support at home?: Yes People in Home: significant other Name of Support/Comfort Primary Source: [name]  Medications Reviewed Today: Medications Reviewed Today     Reviewed by Luella Cook, RN (Case Manager) on 03/03/23 at 1427  Med List Status: <None>   Medication Order Taking? Sig Documenting Provider Last Dose Status Informant  acyclovir ointment (ZOVIRAX) 5 % [id number] Yes Apply 1 Application topically every 3 (three) hours as needed (for fever blisters). [provider] Taking Active Self           Med Note Antony Madura, Arn Medal   Tue Feb 25, 2023  2:40 PM)    albuterol (PROAIR HFA) 108 [phone] Base) MCG/ACT inhaler [id number] No Inhale 2 puffs into the lungs every 6 (six) hours as needed for wheezing or shortness of breath.  Patient not taking: Reported on 02/25/2023   Eustaquio Boyden, MD Not Taking Active Self           Med Note Salvatore Marvel   Tue  Feb 25, 2023  2:40 PM)    apixaban (ELIQUIS) 5 MG TABS tablet [id number] Yes Take 1 tablet (5 mg total) by mouth 2 (two) times daily. Quintella Reichert, MD Taking Active Self  atorvastatin (LIPITOR) 40 MG tablet [id number] Yes TAKE 1 TABLET BY MOUTH EVERY EVENING  Patient taking differently: Take 40 mg by mouth every evening.   Eustaquio Boyden, MD Taking Active Self  Blood Glucose Monitoring Suppl DEVI [id number] Yes 1 each by Does not apply route in the morning, at noon, and at bedtime. May substitute to any manufacturer covered by patient's insurance. Almon Hercules, MD Taking Active   clopidogrel (PLAVIX) 75 MG tablet [id number] Yes Take 1 tablet (75 mg total) by mouth daily. Quintella Reichert, MD Taking Active Self  dorzolamide-timolol (COSOPT) 2-0.5 % ophthalmic solution [id number] Yes Place 1 drop into both eyes 2 (two) times daily. [provider] Taking Active Multiple Informants           Med Note Malachy Moan Feb 25, 2023  4:23 PM) PROVIDER: The patient is in need of a NEW Rx   DULoxetine (CYMBALTA) 60 MG capsule [id number] Yes Take 1 capsule (60 mg total) by mouth daily. Eustaquio Boyden, MD Taking Active Self  gabapentin (NEURONTIN) 300 MG capsule [id number] Yes TAKE ONE CAPSULE BY MOUTH TWICE DAILY  Patient taking differently: Take 300 mg by mouth in the morning and at bedtime.   Eustaquio Boyden, MD Taking Active Self  Glucose Blood (BLOOD GLUCOSE TEST STRIPS) STRP [id number] Yes 1 each by In Vitro route in the morning, at noon,  and at bedtime. May substitute to any manufacturer covered by patient's insurance. Almon Hercules, MD Taking Active   insulin glargine (LANTUS) 100 UNIT/ML Solostar Pen [id number] Yes Inject 40 Units into the skin daily. Almon Hercules, MD Taking Active   Insulin Pen Needle (PEN NEEDLES) 32G X 6 MM MISC [id number] Yes Use 1 pen needle daily to inject insulin. Almon Hercules, MD Taking Active   isosorbide mononitrate (IMDUR) 30 MG 24 hr tablet [id number] Yes  TAKE 1/2 TABLET BY MOUTH ONCE DAILY  Patient taking differently: Take 15 mg by mouth daily.   Quintella Reichert, MD Taking Active Self           Med Note Malachy Moan Feb 25, 2023  2:40 PM)    Lancet Device MISC [id number] Yes 1 each by Does not apply route in the morning, at noon, and at bedtime. May substitute to any manufacturer covered by patient's insurance. Almon Hercules, MD Taking Active   Lancets Misc. MISC [id number] Yes 1 each by Does not apply route in the morning, at noon, and at bedtime. May substitute to any manufacturer covered by patient's insurance. Almon Hercules, MD Taking Active   latanoprost (XALATAN) 0.005 % ophthalmic solution [id number] Yes Place 1 drop into both eyes at bedtime.  [provider] Taking Active Self           Med Note Antony Madura, Arn Medal   Tue Feb 25, 2023  2:40 PM)    linagliptin (TRADJENTA) 5 MG TABS tablet [id number] Yes Take 1 tablet (5 mg total) by mouth daily. Almon Hercules, MD Taking Active   metoprolol succinate (TOPROL XL) 25 MG 24 hr tablet [id number] Yes Take 1 tablet (25 mg total) by mouth daily. Quintella Reichert, MD Taking Active Self  nitroGLYCERIN (NITROSTAT) 0.4 MG SL tablet [id number] Yes Place 1 tablet (0.4 mg total) under the tongue every 5 (five) minutes as needed for chest pain. Quintella Reichert, MD Taking Active Self  ondansetron (ZOFRAN) 4 MG tablet [id number] Yes Take 1 tablet (4 mg total) by mouth every 8 (eight) hours as needed for nausea or vomiting. Eustaquio Boyden, MD Taking Active Self           Med Note Antony Madura, Arn Medal   Tue Feb 25, 2023  4:25 PM) PROVIDER: The patient is in need of a NEW Rx  pantoprazole (PROTONIX) 40 MG tablet [id number] Yes TAKE 1 TABLET BY MOUTH TWICE DAILY *REFILL REQUEST*  Patient taking differently: Take 40 mg by mouth 2 (two) times daily before a meal.   Eustaquio Boyden, MD Taking Active Self  traMADol (ULTRAM) 50 MG tablet [id number] Yes Take 50 mg by mouth every 6 (six) hours as needed for moderate  pain (pain score 4-6). [provider] Taking Active Self           Med Note Antony Madura, Arn Medal   Tue Feb 25, 2023  4:29 PM) PROVIDER: The patient is in need of a NEW Rx  TYLENOL 500 MG tablet [id number] Yes Take 500-1,000 mg by mouth every 6 (six) hours as needed for mild pain or headache. [provider] Taking Active Self            Home Care and Equipment/Supplies: Were Home Health Services Ordered?: NA Any new equipment or medical supplies ordered?: NA  Functional Questionnaire: Do you need assistance with bathing/showering or dressing?: No Do you need assistance with meal preparation?: Yes Do you need  assistance with eating?: No Do you have difficulty maintaining continence: No Do you need assistance with getting out of bed/getting out of a chair/moving?: No Do you have difficulty managing or taking your medications?: No  Follow up appointments reviewed: PCP Follow-up appointment confirmed?: Yes Date of PCP follow-up appointment?: 03/19/23 Follow-up Provider: Dr Sharen Hones (Patient did not want to change the appt date. Labs are being done [id number]) Specialist Hospital Follow-up appointment confirmed?: NA Do you need transportation to your follow-up appointment?: No Do you understand care options if your condition(s) worsen?: Yes-patient verbalized understanding  SDOH Interventions Today    Flowsheet Row Most Recent Value  SDOH Interventions   Food Insecurity Interventions Intervention Not Indicated  Housing Interventions Intervention Not Indicated  Transportation Interventions Intervention Not Indicated, Patient Resources (Friends/Family)  Utilities Interventions Intervention Not Indicated      Interventions Today    Flowsheet Row Most Recent Value  General Interventions   General Interventions Discussed/Reviewed General Interventions Discussed, General Interventions Reviewed, Referral to Nurse, Communication with, Doctor Visits  Doctor Visits  Discussed/Reviewed Doctor Visits Discussed, Doctor Visits Reviewed  [patient refused earlier follow up visit to be scheduled with PCP]  Communication with Social Work, RN  [Made RN care Coordinator aware patient out of hospital and scheduled. Referred to social worker regarding placement]  Nutrition Interventions   Nutrition Discussed/Reviewed Nutrition Discussed, Nutrition Reviewed  [RN made patient aware of [employer] meal plan and to call and initiate]  Pharmacy Interventions   Pharmacy Dicussed/Reviewed Pharmacy Topics Discussed, Pharmacy Topics Reviewed       Patient declined weekly f/u up with care coordination Patient wanted to continue follow up outreach with [name] RN  RN referred to [facility] to discuss placement RN discussed [facility] meal plan initiation  Gean Maidens BSN RN Population Health- Transition of Care Team.  [facility] [phone]

## 2023-03-04 ENCOUNTER — Other Ambulatory Visit: Payer: Self-pay | Admitting: Cardiology

## 2023-03-04 ENCOUNTER — Other Ambulatory Visit: Payer: Self-pay | Admitting: Family Medicine

## 2023-03-04 DIAGNOSIS — E785 Hyperlipidemia, unspecified: Secondary | ICD-10-CM

## 2023-03-04 DIAGNOSIS — F331 Major depressive disorder, recurrent, moderate: Secondary | ICD-10-CM

## 2023-03-04 DIAGNOSIS — E1149 Type 2 diabetes mellitus with other diabetic neurological complication: Secondary | ICD-10-CM

## 2023-03-07 ENCOUNTER — Other Ambulatory Visit: Payer: Self-pay | Admitting: Family Medicine

## 2023-03-07 DIAGNOSIS — E1149 Type 2 diabetes mellitus with other diabetic neurological complication: Secondary | ICD-10-CM

## 2023-03-07 DIAGNOSIS — E785 Hyperlipidemia, unspecified: Secondary | ICD-10-CM

## 2023-03-07 DIAGNOSIS — E611 Iron deficiency: Secondary | ICD-10-CM

## 2023-03-07 DIAGNOSIS — E538 Deficiency of other specified B group vitamins: Secondary | ICD-10-CM

## 2023-03-07 DIAGNOSIS — M85859 Other specified disorders of bone density and structure, unspecified thigh: Secondary | ICD-10-CM

## 2023-03-07 DIAGNOSIS — N1831 Chronic kidney disease, stage 3a: Secondary | ICD-10-CM

## 2023-03-11 ENCOUNTER — Other Ambulatory Visit: Payer: Self-pay | Admitting: Cardiology

## 2023-03-11 DIAGNOSIS — E785 Hyperlipidemia, unspecified: Secondary | ICD-10-CM

## 2023-03-13 ENCOUNTER — Emergency Department (HOSPITAL_COMMUNITY)
Admission: EM | Admit: 2023-03-13 | Discharge: 2023-03-17 | Disposition: A | Discharge status: Other Acute Inpatient Hospital | Payer: 59 | Attending: Emergency Medicine | Admitting: Emergency Medicine

## 2023-03-13 ENCOUNTER — Telehealth: Payer: Self-pay

## 2023-03-13 ENCOUNTER — Encounter (HOSPITAL_COMMUNITY): Payer: Self-pay | Admitting: Emergency Medicine

## 2023-03-13 ENCOUNTER — Other Ambulatory Visit: Payer: Self-pay

## 2023-03-13 ENCOUNTER — Other Ambulatory Visit: Payer: Self-pay | Admitting: Cardiology

## 2023-03-13 ENCOUNTER — Other Ambulatory Visit (INDEPENDENT_AMBULATORY_CARE_PROVIDER_SITE_OTHER): Payer: 59

## 2023-03-13 DIAGNOSIS — I509 Heart failure, unspecified: Secondary | ICD-10-CM | POA: Diagnosis not present

## 2023-03-13 DIAGNOSIS — I2510 Atherosclerotic heart disease of native coronary artery without angina pectoris: Secondary | ICD-10-CM | POA: Insufficient documentation

## 2023-03-13 DIAGNOSIS — E611 Iron deficiency: Secondary | ICD-10-CM | POA: Diagnosis not present

## 2023-03-13 DIAGNOSIS — E538 Deficiency of other specified B group vitamins: Secondary | ICD-10-CM

## 2023-03-13 DIAGNOSIS — R519 Headache, unspecified: Secondary | ICD-10-CM | POA: Diagnosis not present

## 2023-03-13 DIAGNOSIS — Z20822 Contact with and (suspected) exposure to covid-19: Secondary | ICD-10-CM | POA: Diagnosis not present

## 2023-03-13 DIAGNOSIS — E1165 Type 2 diabetes mellitus with hyperglycemia: Secondary | ICD-10-CM | POA: Diagnosis not present

## 2023-03-13 DIAGNOSIS — F329 Major depressive disorder, single episode, unspecified: Secondary | ICD-10-CM | POA: Diagnosis present

## 2023-03-13 DIAGNOSIS — R739 Hyperglycemia, unspecified: Secondary | ICD-10-CM

## 2023-03-13 DIAGNOSIS — N1831 Chronic kidney disease, stage 3a: Secondary | ICD-10-CM | POA: Diagnosis not present

## 2023-03-13 DIAGNOSIS — N1832 Chronic kidney disease, stage 3b: Secondary | ICD-10-CM | POA: Insufficient documentation

## 2023-03-13 DIAGNOSIS — R0789 Other chest pain: Secondary | ICD-10-CM | POA: Diagnosis not present

## 2023-03-13 DIAGNOSIS — I10 Essential (primary) hypertension: Secondary | ICD-10-CM | POA: Diagnosis not present

## 2023-03-13 DIAGNOSIS — E1122 Type 2 diabetes mellitus with diabetic chronic kidney disease: Secondary | ICD-10-CM | POA: Insufficient documentation

## 2023-03-13 DIAGNOSIS — R2681 Unsteadiness on feet: Secondary | ICD-10-CM | POA: Diagnosis not present

## 2023-03-13 DIAGNOSIS — Z79899 Other long term (current) drug therapy: Secondary | ICD-10-CM | POA: Diagnosis not present

## 2023-03-13 DIAGNOSIS — F331 Major depressive disorder, recurrent, moderate: Secondary | ICD-10-CM | POA: Diagnosis present

## 2023-03-13 DIAGNOSIS — Z7901 Long term (current) use of anticoagulants: Secondary | ICD-10-CM | POA: Diagnosis not present

## 2023-03-13 DIAGNOSIS — Z794 Long term (current) use of insulin: Secondary | ICD-10-CM | POA: Insufficient documentation

## 2023-03-13 DIAGNOSIS — R2689 Other abnormalities of gait and mobility: Secondary | ICD-10-CM | POA: Diagnosis not present

## 2023-03-13 DIAGNOSIS — F411 Generalized anxiety disorder: Secondary | ICD-10-CM | POA: Diagnosis not present

## 2023-03-13 DIAGNOSIS — E785 Hyperlipidemia, unspecified: Secondary | ICD-10-CM | POA: Diagnosis not present

## 2023-03-13 DIAGNOSIS — I6782 Cerebral ischemia: Secondary | ICD-10-CM | POA: Diagnosis not present

## 2023-03-13 DIAGNOSIS — E1149 Type 2 diabetes mellitus with other diabetic neurological complication: Secondary | ICD-10-CM

## 2023-03-13 DIAGNOSIS — M85859 Other specified disorders of bone density and structure, unspecified thigh: Secondary | ICD-10-CM | POA: Diagnosis not present

## 2023-03-13 DIAGNOSIS — G4489 Other headache syndrome: Secondary | ICD-10-CM

## 2023-03-13 LAB — BASIC METABOLIC PANEL
Anion gap: 14 (ref 5–15)
BUN: 12 mg/dL (ref 8–23)
CO2: 18 mmol/L — ABNORMAL LOW (ref 22–32)
Calcium: 8.8 mg/dL — ABNORMAL LOW (ref 8.9–10.3)
Chloride: 100 mmol/L (ref 98–111)
Creatinine, Ser: 0.99 mg/dL (ref 0.44–1.00)
GFR, Estimated: 58 mL/min — ABNORMAL LOW (ref >60)
Glucose, Bld: 404 mg/dL — ABNORMAL HIGH (ref 70–99)
Potassium: 3.3 mmol/L — ABNORMAL LOW (ref 3.5–5.1)
Sodium: 132 mmol/L — ABNORMAL LOW (ref 135–145)

## 2023-03-13 LAB — CBC WITH DIFFERENTIAL/PLATELET
Abs Immature Granulocytes: 0.03 10*3/uL (ref 0.00–0.07)
Basophils Absolute: 0.1 10*3/uL (ref 0.0–0.1)
Basophils Absolute: 0.1 10*3/uL (ref 0.0–0.1)
Basophils Relative: 1 % (ref 0.0–3.0)
Basophils Relative: 1 %
Eosinophils Absolute: 0.3 10*3/uL (ref 0.0–0.7)
Eosinophils Absolute: 0.4 10*3/uL (ref 0.0–0.5)
Eosinophils Relative: 4.5 % (ref 0.0–5.0)
Eosinophils Relative: 5 %
HCT: 35.8 % — ABNORMAL LOW (ref 36.0–46.0)
HCT: 35.3 % — ABNORMAL LOW (ref 36.0–46.0)
Hemoglobin: 11.7 g/dL — ABNORMAL LOW (ref 12.0–15.0)
Hemoglobin: 12.3 g/dL (ref 12.0–15.0)
Immature Granulocytes: 0 %
Lymphocytes Relative: 21 % (ref 12.0–46.0)
Lymphocytes Relative: 34 %
Lymphs Abs: 1.5 10*3/uL (ref 0.7–4.0)
Lymphs Abs: 2.4 10*3/uL (ref 0.7–4.0)
MCH: 30.5 pg (ref 26.0–34.0)
MCHC: 32.8 g/dL (ref 30.0–36.0)
MCHC: 34.8 g/dL (ref 30.0–36.0)
MCV: 90.4 fL (ref 78.0–100.0)
MCV: 87.6 fL (ref 80.0–100.0)
Monocytes Absolute: 0.3 10*3/uL (ref 0.1–1.0)
Monocytes Absolute: 0.4 10*3/uL (ref 0.1–1.0)
Monocytes Relative: 4.2 % (ref 3.0–12.0)
Monocytes Relative: 6 %
Neutro Abs: 4.9 10*3/uL (ref 1.4–7.7)
Neutro Abs: 3.8 10*3/uL (ref 1.7–7.7)
Neutrophils Relative %: 69.3 % (ref 43.0–77.0)
Neutrophils Relative %: 54 %
Platelets: 259 10*3/uL (ref 150.0–400.0)
Platelets: 274 10*3/uL (ref 150–400)
RBC: 3.96 Mil/uL (ref 3.87–5.11)
RBC: 4.03 MIL/uL (ref 3.87–5.11)
RDW: 14.2 % (ref 11.5–15.5)
RDW: 13.2 % (ref 11.5–15.5)
WBC: 7.1 10*3/uL (ref 4.0–10.5)
WBC: 7.1 10*3/uL (ref 4.0–10.5)
nRBC: 0 % (ref 0.0–0.2)

## 2023-03-13 LAB — VITAMIN D 25 HYDROXY (VIT D DEFICIENCY, FRACTURES): VITD: 26.69 ng/mL — ABNORMAL LOW (ref 30.00–100.00)

## 2023-03-13 LAB — URINALYSIS, ROUTINE W REFLEX MICROSCOPIC
Bilirubin Urine: NEGATIVE
Glucose, UA: >=500 mg/dL — AB
Hgb urine dipstick: NEGATIVE
Ketones, ur: NEGATIVE mg/dL
Nitrite: NEGATIVE
Protein, ur: NEGATIVE mg/dL
Specific Gravity, Urine: 1.028 (ref 1.005–1.030)
pH: 5 (ref 5.0–8.0)

## 2023-03-13 LAB — COMPREHENSIVE METABOLIC PANEL
ALT: 11 U/L (ref 0–35)
AST: 12 U/L (ref 0–37)
Albumin: 4.1 g/dL (ref 3.5–5.2)
Alkaline Phosphatase: 104 U/L (ref 39–117)
BUN: 14 mg/dL (ref 6–23)
CO2: 21 meq/L (ref 19–32)
Calcium: 8.8 mg/dL (ref 8.4–10.5)
Chloride: 101 meq/L (ref 96–112)
Creatinine, Ser: 1.33 mg/dL — ABNORMAL HIGH (ref 0.40–1.20)
GFR: 37.93 mL/min — ABNORMAL LOW (ref >60.00)
Glucose, Bld: 580 mg/dL (ref 70–99)
Potassium: 4.1 meq/L (ref 3.5–5.1)
Sodium: 134 meq/L — ABNORMAL LOW (ref 135–145)
Total Bilirubin: 0.5 mg/dL (ref 0.2–1.2)
Total Protein: 6.7 g/dL (ref 6.0–8.3)

## 2023-03-13 LAB — PHOSPHORUS: Phosphorus: 3.7 mg/dL (ref 2.3–4.6)

## 2023-03-13 LAB — LIPID PANEL
Cholesterol: 115 mg/dL (ref 0–200)
HDL: 32.9 mg/dL — ABNORMAL LOW (ref >39.00)
LDL Cholesterol: 42 mg/dL (ref 0–99)
NonHDL: 81.9
Total CHOL/HDL Ratio: 3
Triglycerides: 200 mg/dL — ABNORMAL HIGH (ref 0.0–149.0)
VLDL: 40 mg/dL (ref 0.0–40.0)

## 2023-03-13 LAB — MICROALBUMIN / CREATININE URINE RATIO
Creatinine,U: 57.8 mg/dL
Microalb Creat Ratio: 1.4 mg/g (ref 0.0–30.0)
Microalb, Ur: 0.8 mg/dL (ref 0.0–1.9)

## 2023-03-13 LAB — FERRITIN: Ferritin: 12.7 ng/mL (ref 10.0–291.0)

## 2023-03-13 LAB — VITAMIN B12: Vitamin B-12: 521 pg/mL (ref 211–911)

## 2023-03-13 LAB — IBC PANEL
Iron: 44 ug/dL (ref 42–145)
Saturation Ratios: 14.2 % — ABNORMAL LOW (ref 20.0–50.0)
TIBC: 309.4 ug/dL (ref 250.0–450.0)
Transferrin: 221 mg/dL (ref 212.0–360.0)

## 2023-03-13 LAB — MAGNESIUM: Magnesium: 1.4 mg/dL — ABNORMAL LOW (ref 1.5–2.5)

## 2023-03-13 LAB — CBG MONITORING, ED: Glucose-Capillary: 366 mg/dL — ABNORMAL HIGH (ref 70–99)

## 2023-03-13 LAB — BETA-HYDROXYBUTYRIC ACID: Beta-Hydroxybutyric Acid: 0.12 mmol/L (ref 0.05–0.27)

## 2023-03-13 NOTE — ED Provider Triage Note (Signed)
 Emergency Medicine Provider Triage Evaluation Note  Maria Fox , a 79 y.o. female  was evaluated in triage.  Pt complains of headache over the last 48 hours.  Patient was seen by her primary care doctor and was called today to go to the emergency room for hyperglycemia in the 500s.  Patient does have a history of diabetes.  Not currently on insulin .  She denies any nausea, vomiting, abdominal pain.  Review of Systems  Positive:  Negative: See above   Physical Exam  BP (!) 188/99 (BP Location: Left Arm)   Pulse 76   Temp 98.6 F (37 C) (Oral)   Resp 17   Ht 5' 3 (1.6 m)   Wt 59 kg   SpO2 100%   BMI 23.04 kg/m  Gen:   Awake, no distress   Resp:  Normal effort  MSK:   Moves extremities without difficulty  Other:    Medical Decision Making  Medically screening exam initiated at 8:34 PM.  Appropriate orders placed.  Zelda LITTIE Novak was informed that the remainder of the evaluation will be completed by another provider, this initial triage assessment does not replace that evaluation, and the importance of remaining in the ED until their evaluation is complete.     Theotis Peers Riley, [place] 03/13/23 2036

## 2023-03-13 NOTE — Telephone Encounter (Signed)
 Pt's brother called back stating he was not able to get her by phone. He is going to go to her house. Said if he had to, he would take her to the ER. I advised him that the office closes at 5pm but messages can be left until 6pm if he were to call back.

## 2023-03-13 NOTE — Telephone Encounter (Signed)
 I tried to call, again, and it rings then cuts off. NO VM to leave a message.

## 2023-03-13 NOTE — Telephone Encounter (Signed)
 Called and spoke to her brother, [name], as he is on her DPR. He will try to call her. He will advise her to call the office or go to the ER due to the high glucose reading.

## 2023-03-13 NOTE — Telephone Encounter (Signed)
 [id number] from [facility] called with a critically high glucose of 580 done this am at 1030.   I tried to call the pt to see if she was fasting, but I there was no VM and no answer.   Sending this to Dr Para March in Dr Gutierrez's absence today.

## 2023-03-13 NOTE — Telephone Encounter (Signed)
 Rec ER.  Thanks.  Routed to PCP as FYI.

## 2023-03-13 NOTE — ED Triage Notes (Signed)
 Patient c/o headache x 1 week. Patient report she was seen by Primary care this morning and recommended her to come to Emergency room tonight d/t Abnormal labs. Patient c/o n/v. Patient denies fever.

## 2023-03-14 ENCOUNTER — Telehealth: Payer: Self-pay | Admitting: Surgery

## 2023-03-14 ENCOUNTER — Emergency Department (HOSPITAL_COMMUNITY): Payer: 59

## 2023-03-14 DIAGNOSIS — I6782 Cerebral ischemia: Secondary | ICD-10-CM | POA: Diagnosis not present

## 2023-03-14 DIAGNOSIS — R519 Headache, unspecified: Secondary | ICD-10-CM | POA: Diagnosis not present

## 2023-03-14 DIAGNOSIS — F411 Generalized anxiety disorder: Secondary | ICD-10-CM | POA: Diagnosis not present

## 2023-03-14 DIAGNOSIS — F331 Major depressive disorder, recurrent, moderate: Secondary | ICD-10-CM | POA: Diagnosis not present

## 2023-03-14 LAB — RAPID URINE DRUG SCREEN, HOSP PERFORMED
Amphetamines: NOT DETECTED
Barbiturates: NOT DETECTED
Benzodiazepines: NOT DETECTED
Cocaine: NOT DETECTED
Opiates: NOT DETECTED
Tetrahydrocannabinol: NOT DETECTED

## 2023-03-14 LAB — HEPATIC FUNCTION PANEL
ALT: 14 U/L (ref 0–44)
AST: 14 U/L — ABNORMAL LOW (ref 15–41)
Albumin: 3.7 g/dL (ref 3.5–5.0)
Alkaline Phosphatase: 89 U/L (ref 38–126)
Bilirubin, Direct: <0.1 mg/dL (ref 0.0–0.2)
Total Bilirubin: 0.7 mg/dL (ref 0.0–1.2)
Total Protein: 6.9 g/dL (ref 6.5–8.1)

## 2023-03-14 LAB — CBG MONITORING, ED
Glucose-Capillary: 473 mg/dL — ABNORMAL HIGH (ref 70–99)
Glucose-Capillary: 294 mg/dL — ABNORMAL HIGH (ref 70–99)
Glucose-Capillary: 214 mg/dL — ABNORMAL HIGH (ref 70–99)
Glucose-Capillary: 215 mg/dL — ABNORMAL HIGH (ref 70–99)
Glucose-Capillary: 203 mg/dL — ABNORMAL HIGH (ref 70–99)
Glucose-Capillary: 130 mg/dL — ABNORMAL HIGH (ref 70–99)
Glucose-Capillary: 120 mg/dL — ABNORMAL HIGH (ref 70–99)

## 2023-03-14 LAB — LIPASE, BLOOD: Lipase: 49 U/L (ref 11–51)

## 2023-03-14 LAB — ETHANOL: Alcohol, Ethyl (B): <10 mg/dL (ref <10)

## 2023-03-14 LAB — TROPONIN I (HIGH SENSITIVITY): Troponin I (High Sensitivity): 12 ng/L (ref <18)

## 2023-03-14 MED ORDER — INSULIN GLARGINE-YFGN 100 UNIT/ML ~~LOC~~ SOLN
25.0000 [IU] | Freq: Every day | SUBCUTANEOUS | Status: DC
  Administered 2023-03-14 – 2023-03-17 (×4): 25 [IU] via SUBCUTANEOUS
  Filled 2023-03-14 (×4): qty 0.25

## 2023-03-14 MED ORDER — ATORVASTATIN CALCIUM 40 MG PO TABS
40.0000 mg | ORAL_TABLET | Freq: Every evening | ORAL | Status: DC
  Administered 2023-03-14 – 2023-03-16 (×3): 40 mg via ORAL
  Filled 2023-03-14 (×3): qty 1

## 2023-03-14 MED ORDER — SODIUM CHLORIDE 0.9 % IV BOLUS (SEPSIS)
1000.0000 mL | Freq: Once | INTRAVENOUS | Status: AC
  Administered 2023-03-14: 1000 mL via INTRAVENOUS

## 2023-03-14 MED ORDER — METOCLOPRAMIDE HCL 5 MG/ML IJ SOLN
5.0000 mg | Freq: Once | INTRAMUSCULAR | Status: AC
  Administered 2023-03-14: 5 mg via INTRAVENOUS
  Filled 2023-03-14: qty 2

## 2023-03-14 MED ORDER — INSULIN ASPART 100 UNIT/ML IJ SOLN
0.0000 [IU] | Freq: Three times a day (TID) | INTRAMUSCULAR | Status: DC
Start: 2023-03-14 — End: 2023-03-17
  Administered 2023-03-14: 2 [IU] via SUBCUTANEOUS
  Administered 2023-03-14: 3 [IU] via SUBCUTANEOUS
  Administered 2023-03-15: 2 [IU] via SUBCUTANEOUS
  Administered 2023-03-15: 5 [IU] via SUBCUTANEOUS
  Administered 2023-03-15: 1 [IU] via SUBCUTANEOUS
  Administered 2023-03-16: 7 [IU] via SUBCUTANEOUS
  Administered 2023-03-16: 2 [IU] via SUBCUTANEOUS
  Administered 2023-03-16: 5 [IU] via SUBCUTANEOUS
  Administered 2023-03-17: 1 [IU] via SUBCUTANEOUS
  Filled 2023-03-14: qty 0.09

## 2023-03-14 MED ORDER — ARIPIPRAZOLE 2 MG PO TABS
1.0000 mg | ORAL_TABLET | Freq: Every day | ORAL | Status: DC
  Administered 2023-03-14 – 2023-03-16 (×3): 1 mg via ORAL
  Filled 2023-03-14 (×3): qty 1

## 2023-03-14 MED ORDER — LINAGLIPTIN 5 MG PO TABS
5.0000 mg | ORAL_TABLET | Freq: Every day | ORAL | Status: DC
  Administered 2023-03-15 – 2023-03-17 (×3): 5 mg via ORAL
  Filled 2023-03-14 (×3): qty 1

## 2023-03-14 MED ORDER — DIPHENHYDRAMINE HCL 50 MG/ML IJ SOLN
12.5000 mg | Freq: Once | INTRAMUSCULAR | Status: AC
  Administered 2023-03-14: 12.5 mg via INTRAVENOUS
  Filled 2023-03-14: qty 1

## 2023-03-14 MED ORDER — CLOPIDOGREL BISULFATE 75 MG PO TABS
75.0000 mg | ORAL_TABLET | Freq: Every day | ORAL | Status: DC
  Administered 2023-03-15 – 2023-03-17 (×3): 75 mg via ORAL
  Filled 2023-03-14 (×3): qty 1

## 2023-03-14 MED ORDER — ONDANSETRON HCL 4 MG PO TABS
4.0000 mg | ORAL_TABLET | Freq: Three times a day (TID) | ORAL | Status: DC | PRN
  Administered 2023-03-17: 4 mg via ORAL
  Filled 2023-03-14: qty 1

## 2023-03-14 MED ORDER — DULOXETINE HCL 30 MG PO CPEP
60.0000 mg | ORAL_CAPSULE | Freq: Every day | ORAL | Status: DC
  Administered 2023-03-14 – 2023-03-17 (×4): 60 mg via ORAL
  Filled 2023-03-14 (×4): qty 2

## 2023-03-14 MED ORDER — INSULIN ASPART 100 UNIT/ML IJ SOLN
5.0000 [IU] | Freq: Once | INTRAMUSCULAR | Status: AC
  Administered 2023-03-14: 5 [IU] via SUBCUTANEOUS
  Filled 2023-03-14: qty 0.05

## 2023-03-14 MED ORDER — SODIUM CHLORIDE 0.9 % IV BOLUS (SEPSIS): 2000.0000 mL | Freq: Once | INTRAVENOUS | Status: DC

## 2023-03-14 MED ORDER — APIXABAN 5 MG PO TABS
5.0000 mg | ORAL_TABLET | Freq: Two times a day (BID) | ORAL | Status: DC
  Administered 2023-03-14 – 2023-03-17 (×6): 5 mg via ORAL
  Filled 2023-03-14 (×6): qty 1

## 2023-03-14 MED ORDER — METOPROLOL SUCCINATE ER 50 MG PO TB24
25.0000 mg | ORAL_TABLET | Freq: Every day | ORAL | Status: DC
  Administered 2023-03-15 – 2023-03-17 (×3): 25 mg via ORAL
  Filled 2023-03-14 (×3): qty 1

## 2023-03-14 MED ORDER — ACETAMINOPHEN 325 MG PO TABS
650.0000 mg | ORAL_TABLET | ORAL | Status: DC | PRN
  Administered 2023-03-14 – 2023-03-16 (×3): 650 mg via ORAL
  Filled 2023-03-14 (×3): qty 2

## 2023-03-14 MED ORDER — INSULIN ASPART 100 UNIT/ML IJ SOLN
0.0000 [IU] | Freq: Every day | INTRAMUSCULAR | Status: DC
  Administered 2023-03-15: 2 [IU] via SUBCUTANEOUS
  Filled 2023-03-14: qty 0.05

## 2023-03-14 NOTE — ED Notes (Signed)
 [facility] received a call back from [facility], pts significant other. Pts SO reports that pt has struggled with depression for a while and after having a heart attack in 2016, pts personality changed. Pt became more anxious, isolated from friends and family, and has seemed hopeless.   About 4-5 years ago pt was [facility] to a state hospital for a few weeks for depression. Pt was prescribed medication which she took for a while but he believes she stopped taking about a year ago. Pt was seen at [facility] in August of 2024 but did not follow up with any out patient treatment. Pts chart indicates a diagnosis of MDD. Pts SO reports that pt can become easily frustrated and angry at times as well as very emotional. Pts SO reports that pt has never actively tried to hurt herself or others but has spoken about taking all over her medications at one time but not voicing that she was trying to kill herself.   Pts SO reports a family history of mental illness with pts granny having a similar presentation as pt, being frustrated, angry and depressed. Pts SO expressed concerns that pt has been forgetful and argumentative about certain facts and is concerned about pt having dementia. Pts SO would like pt to receive treatment for her depression but reports pt has been resistant.  Chesley Holt, [facility]  03/14/23

## 2023-03-14 NOTE — ED Provider Notes (Signed)
 [place] EMERGENCY DEPARTMENT AT [facility] Provider Note   CSN: [id number] Arrival date & time: 03/13/23  1954     History  Chief Complaint  Patient presents with   Headache   abnormal labs    Maria Fox is a 78 y.o. female.  The history is provided by the patient and a friend.  Patient with extensive history including diabetes, previous CVA, CAD presents for multiple complaints. Patient reports for the past several days she has had ongoing headache.  Denies any recent head trauma.  No fevers or vomiting.  No focal weakness. Patient also reports she is now having upper abdominal pain and some intermittent chest discomfort but none at this time Patient reports she does not feel good but is unable to exactly tell me what is bothering her Patient also reports her PCP checked her labs as an outpatient and called her and told her to go to the ER for elevated glucose Friend Maria Fox at bedside reports the patient is intermittently compliant with meds and drinks a lot of sodas  Patient reports chronic dizziness for several weeks but refuses to use a walker at home  Patient lives with her friend Maria Fox Past Medical History:  Diagnosis Date   Acute cholecystitis 07/07/2019   Acute deep vein thrombosis (DVT) of left tibial vein ([name]) 07/11/2019   Acute left PCA stroke ([name]) 07/13/2015   Angioedema    Atrial flutter ([name])    Autoimmune deficiency syndrome ([name])    CAD (coronary artery disease), native coronary artery 06/2014   Chronic diastolic CHF (congestive heart failure) ([name]) 08/27/2014   Chronic kidney disease, stage 3b ([name])    Closed fracture of maxilla ([name])    COVID-19 virus infection 03/2020   CVA (cerebral infarction) 07/2014   bilateral corona radiata - periCABG   Dermatitis    eval [name] 2011: eczema, eval [name] 2011: bx negative for lichen simplex or derm herpetiformis   DM (diabetes mellitus), type 2, uncontrolled w/neurologic complication 06/02/2012    ?autonomic neuropathy, gastroparesis (06/2014)    Epidermal cyst of neck 03/25/2017   Excised by derm Berna)   HCAP (healthcare-associated pneumonia) 06/2014   History of chicken pox    HLD (hyperlipidemia)    Hx of migraines    remote   Hypertension    Lobar pneumonia ([name]) 03/02/2022   Maxillary fracture ([name])    Mitral regurgitation    Multiple allergies    mold, wool, dust, feathers   NSVT (nonsustained ventricular tachycardia) ([name])    Orthostatic hypotension 07/2015   Pleural effusion, left    RBBB    S/P lens implant    left side ([name])   Tricuspid regurgitation    UTI (urinary tract infection) 06/2014   Vitiligo     Home Medications Prior to Admission medications   Medication Sig Start Date End Date Taking? Authorizing Provider  apixaban  (ELIQUIS ) 5 MG TABS tablet Take 1 tablet (5 mg total) by mouth 2 (two) times daily. 11/08/22  Yes Turner, Wilbert SAUNDERS, MD  clopidogrel  (PLAVIX ) 75 MG tablet TAKE 1 TABLET BY MOUTH ONCE DAILY 03/06/23  Yes Turner, Wilbert SAUNDERS, MD  linagliptin  (TRADJENTA ) 5 MG TABS tablet Take 1 tablet (5 mg total) by mouth daily. 03/01/23  Yes Gonfa, Taye T, MD  metoprolol  succinate (TOPROL -XL) 25 MG 24 hr tablet TAKE 1 TABLET BY MOUTH ONCE DAILY 03/06/23  Yes Turner, Wilbert SAUNDERS, MD  acyclovir  ointment (ZOVIRAX ) 5 % Apply 1 Application topically every 3 (three) hours  as needed (for fever blisters).    [provider]  atorvastatin  (LIPITOR) 40 MG tablet TAKE 1 TABLET BY MOUTH EVERY EVENING 03/06/23   Rilla Baller, MD  Blood Glucose Monitoring Suppl DEVI 1 each by Does not apply route in the morning, at noon, and at bedtime. May substitute to any manufacturer covered by patient's insurance. 03/01/23   Gonfa, Taye T, MD  dorzolamide -timolol  (COSOPT ) 2-0.5 % ophthalmic solution Place 1 drop into both eyes 2 (two) times daily.    [provider]  DULoxetine  (CYMBALTA ) 60 MG capsule TAKE 1 CAPSULE BY MOUTH ONCE DAILY 03/06/23   Rilla Baller, MD  gabapentin  (NEURONTIN ) 300 MG capsule TAKE ONE CAPSULE BY MOUTH TWICE DAILY Patient taking differently: Take 300 mg by mouth in the morning and at bedtime. 08/14/22   Rilla Baller, MD  Glucose Blood (BLOOD GLUCOSE TEST STRIPS) STRP 1 each by In Vitro route in the morning, at noon, and at bedtime. May substitute to any manufacturer covered by patient's insurance. 03/01/23 04/03/23  Gonfa, Taye T, MD  insulin  glargine (LANTUS ) 100 UNIT/ML Solostar Pen Inject 40 Units into the skin daily. 03/01/23   Kathrin Mignon DASEN, MD  Insulin  Pen Needle (PEN NEEDLES) 32G X 6 MM MISC Use 1 pen needle daily to inject insulin . 03/01/23   Gonfa, Taye T, MD  isosorbide  mononitrate (IMDUR ) 30 MG 24 hr tablet TAKE 1/2 TABLET BY MOUTH ONCE DAILY 03/06/23   Shlomo Wilbert SAUNDERS, MD  Lancet Device MISC 1 each by Does not apply route in the morning, at noon, and at bedtime. May substitute to any manufacturer covered by patient's insurance. 03/01/23 03/31/23  Kathrin Mignon DASEN, MD  Lancets Misc. MISC 1 each by Does not apply route in the morning, at noon, and at bedtime. May substitute to any manufacturer covered by patient's insurance. 03/01/23 03/31/23  Gonfa, Taye T, MD  latanoprost  (XALATAN ) 0.005 % ophthalmic solution Place 1 drop into both eyes at bedtime.  07/14/18   [provider]  metFORMIN  (GLUCOPHAGE -XR) 500 MG 24 hr tablet TAKE 2 TABLETS BY MOUTH EVERY MORNING 03/06/23   Rilla Baller, MD  nitroGLYCERIN  (NITROSTAT ) 0.4 MG SL tablet Place 1 tablet (0.4 mg total) under the tongue every 5 (five) minutes as needed for chest pain. 11/14/22   Shlomo Wilbert SAUNDERS, MD  ondansetron  (ZOFRAN ) 4 MG tablet Take 1 tablet (4 mg total) by mouth every 8 (eight) hours as needed for nausea or vomiting. 12/27/22   Rilla Baller, MD  pantoprazole  (PROTONIX ) 40 MG tablet TAKE 1 TABLET BY MOUTH TWICE DAILY *REFILL REQUEST* Patient taking differently: Take 40 mg by mouth 2 (two) times daily before a meal. 11/05/22   Rilla Baller, MD  traMADol  (ULTRAM ) 50 MG tablet Take 50 mg by mouth every 6 (six) hours as needed for moderate pain (pain score 4-6).    [provider]  TYLENOL  500 MG tablet Take 500-1,000 mg by mouth every 6 (six) hours as needed for mild pain or headache.    [provider]      Allergies    Bee venom, Mushroom, Mushroom extract complex (do not select), Penicillins, Codeine, Sulfa  antibiotics, Iohexol , and Erythromycin base    Review of Systems   Review of Systems  Constitutional:  Negative for fever.  Cardiovascular:  Positive for chest pain.  Gastrointestinal:  Positive for abdominal pain. Negative for vomiting.  Neurological:  Positive for headaches. Negative for weakness.    Physical Exam Updated Vital Signs BP (!) 167/92  Pulse (!) 104   Temp 97.8 F (36.6 C) (Oral)   Resp 16   Ht 1.6 m (5' 3)   Wt 59 kg   SpO2 100%   BMI 23.04 kg/m  Physical Exam CONSTITUTIONAL: Elderly and anxious HEAD: Normocephalic/atraumatic EYES: EOMI/PERRL, no nystagmus, no ptosis ENMT: Mucous membranes dry NECK: supple no meningeal signs CV: S1/S2 noted, no murmurs/rubs/gallops noted LUNGS: Lungs are clear to auscultation bilaterally, no apparent distress ABDOMEN: soft, nontender, no rebound or guarding NEURO:Awake/alert, face symmetric, no arm or leg drift is noted Equal 5/5 strength with shoulder abduction, elbow flex/extension, wrist flex/extension in upper extremities and equal hand grips bilaterally Cranial nerves 3/4/5/6/09/16/08/11/12 tested and intact No past pointing Sensation to light touch intact in all extremities Patient is able to stand independently & take a few steps but reports chronic dizziness when she walks EXTREMITIES: pulses normal, full ROM, no deformities SKIN: warm, color normal PSYCH: no abnormalities of mood noted  ED Results / Procedures / Treatments   Labs (all labs ordered are listed, but only abnormal results are displayed) Labs Reviewed   URINALYSIS, ROUTINE W REFLEX MICROSCOPIC - Abnormal; Notable for the following components:      Result Value   APPearance HAZY (*)    Glucose, UA >=500 (*)    Leukocytes,Ua TRACE (*)    Bacteria, UA RARE (*)    All other components within normal limits  BASIC METABOLIC PANEL - Abnormal; Notable for the following components:   Sodium 132 (*)    Potassium 3.3 (*)    CO2 18 (*)    Glucose, Bld 404 (*)    Calcium  8.8 (*)    GFR, Estimated 58 (*)    All other components within normal limits  CBC WITH DIFFERENTIAL/PLATELET - Abnormal; Notable for the following components:   HCT 35.3 (*)    All other components within normal limits  HEPATIC FUNCTION PANEL - Abnormal; Notable for the following components:   AST 14 (*)    All other components within normal limits  CBG MONITORING, ED - Abnormal; Notable for the following components:   Glucose-Capillary 366 (*)    All other components within normal limits  CBG MONITORING, ED - Abnormal; Notable for the following components:   Glucose-Capillary 473 (*)    All other components within normal limits  CBG MONITORING, ED - Abnormal; Notable for the following components:   Glucose-Capillary 294 (*)    All other components within normal limits  BETA-HYDROXYBUTYRIC ACID  RAPID URINE DRUG SCREEN, HOSP PERFORMED  ETHANOL  LIPASE, BLOOD  TROPONIN I (HIGH SENSITIVITY)    EKG EKG Interpretation Date/Time:  Thursday March 13 2023 20:06:02 EST Ventricular Rate:  76 PR Interval:  66 QRS Duration:  142 QT Interval:  435 QTC Calculation: 490 R Axis:   -60  Text Interpretation: Sinus rhythm Short PR interval Right bundle branch block LVH with IVCD and secondary repol abnrm Borderline prolonged QT interval Confirmed by Midge Golas ([id number]) on 03/14/2023 3:11:29 AM  Radiology CT Head Wo Contrast Result Date: 03/14/2023 CLINICAL DATA:  Headache, new onset.  Symptoms for 1 week. EXAM: CT HEAD WITHOUT CONTRAST TECHNIQUE: Contiguous axial images  were obtained from the base of the skull through the vertex without intravenous contrast. RADIATION DOSE REDUCTION: This exam was performed according to the departmental dose-optimization program which includes automated exposure control, adjustment of the mA and/or kV according to patient size and/or use of iterative reconstruction technique. COMPARISON:  Brain MRI 02/26/2023 FINDINGS: Brain: No evidence  of acute infarction, hemorrhage, hydrocephalus, extra-axial collection or mass lesion/mass effect. Generalized brain atrophy, fairly pronounced. Chronic ischemic gliosis in the cerebral white matter with chronic left occipital cortex infarct. Vascular: No hyperdense vessel or unexpected calcification. Skull: Normal. Negative for fracture or focal lesion. Sinuses/Orbits: No acute finding. IMPRESSION: 1. No acute finding. 2. Brain atrophy and chronic left occipital infarct. Electronically Signed   By: Dorn Roulette M.D.   On: 03/14/2023 05:50    Procedures Procedures    Medications Ordered in ED Medications  acetaminophen  (TYLENOL ) tablet 650 mg (has no administration in time range)  ondansetron  (ZOFRAN ) tablet 4 mg (has no administration in time range)  apixaban  (ELIQUIS ) tablet 5 mg (has no administration in time range)  atorvastatin  (LIPITOR) tablet 40 mg (has no administration in time range)  clopidogrel  (PLAVIX ) tablet 75 mg (has no administration in time range)  DULoxetine  (CYMBALTA ) DR capsule 60 mg (has no administration in time range)  linagliptin  (TRADJENTA ) tablet 5 mg (has no administration in time range)  metoprolol  succinate (TOPROL -XL) 24 hr tablet 25 mg (has no administration in time range)  metoCLOPramide  (REGLAN ) injection 5 mg (5 mg Intravenous Given 03/14/23 0442)  diphenhydrAMINE  (BENADRYL ) injection 12.5 mg (12.5 mg Intravenous Given 03/14/23 0441)  sodium chloride  0.9 % bolus 1,000 mL (1,000 mLs Intravenous New Bag/Given 03/14/23 0443)  insulin  aspart (novoLOG ) injection 5 Units  (5 Units Subcutaneous Given 03/14/23 0501)    ED Course/ Medical Decision Making/ A&P Clinical Course as of 03/14/23 0652  Fri Mar 14, 2023  0436 Patient presents multiple complaints including abdominal pain, chest discomfort, headache and not feeling  Overall patient is awake and alert with no focal neurodeficits.  Due to her persistent headache and is on anticoagulation, will obtain CT head.  However she has no signs of acute neurologic emergency  Patient was told by her PCP that her blood glucose was elevated.  She is hyperglycemic here without anion gap  Will give fluids, treat her headache and reassess.  Patient also tells me she needs to see someone from behavioral health as she may need assistance. [DW]  [phone] Patient reports feeling improved, resting comfortably.  Workup was overall unremarkable.  CT head negative.  Glucose is improving.  Patient reports he still would like to be seen by behavioral health.  She also has a very challenging social situation as she lives with a friend of hers and it.  She has difficulty managing her health conditions [DW]  [phone] The patient has been placed in psychiatric observation due to the need to provide a safe environment for the patient while obtaining psychiatric consultation and evaluation, as well as ongoing medical and medication management to treat the patient's condition.  The patient has not been placed under full IVC at this time.   [DW]    Clinical Course User Index [DW] Midge Golas, MD                                 Medical Decision Making Amount and/or Complexity of Data Reviewed Labs: ordered. Radiology: ordered.  Risk OTC drugs. Prescription drug management.   This patient presents to the ED for concern of headache, this involves an extensive number of treatment options, and is a complaint that carries with it a high risk of complications and morbidity.  The differential diagnosis includes but is not limited to  subarachnoid hemorrhage, intracranial hemorrhage, meningitis, encephalitis, CVST, temporal arteritis, idiopathic intracranial  hypertension, migraine    Comorbidities that complicate the patient evaluation: Patient's presentation is complicated by their history of previous CVA  Social Determinants of Health: Patient's  poor mobility   increases the complexity of managing their presentation  Additional history obtained: Additional history obtained from  friend Records reviewed previous admission documents  Lab Tests: I Ordered, and personally interpreted labs.  The pertinent results include: Hyperglycemia without anion gap  Imaging Studies ordered: I ordered imaging studies including CT scan head   I independently visualized and interpreted imaging which showed no acute findings I agree with the radiologist interpretation   Medicines ordered and prescription drug management: I ordered medication including Reglan  and Benadryl  for headache & nausea Reevaluation of the patient after these medicines showed that the patient    improved  Reevaluation: After the interventions noted above, I reevaluated the patient and found that they have :improved  Complexity of problems addressed: Patient's presentation is most consistent with  acute presentation with potential threat to life or bodily function          Final Clinical Impression(s) / ED Diagnoses Final diagnoses:  Other headache syndrome  Hyperglycemia    Rx / DC Orders ED Discharge Orders     None         Midge Golas, MD 03/14/23 [phone]

## 2023-03-14 NOTE — ED Provider Notes (Signed)
 Emergency Medicine Observation Re-evaluation Note  Maria Fox is a 77 y.o. female, seen on rounds today.  Pt initially presented to the ED for complaints of Headache and abnormal labs Currently, the patient is sleeping.  Physical Exam  BP (!) 167/92   Pulse (!) 104   Temp 97.8 F (36.6 C) (Oral)   Resp 16   Ht 5' 3 (1.6 m)   Wt 59 kg   SpO2 100%   BMI 23.04 kg/m  Physical Exam General: nad Cardiac: mild tachycardia Lungs: clear Psych: calm and cooperative at this time  ED Course / MDM  EKG:EKG Interpretation Date/Time:  Thursday March 13 2023 20:06:02 EST Ventricular Rate:  76 PR Interval:  66 QRS Duration:  142 QT Interval:  435 QTC Calculation: 490 R Axis:   -60  Text Interpretation: Sinus rhythm Short PR interval Right bundle branch block LVH with IVCD and secondary repol abnrm Borderline prolonged QT interval Confirmed by Midge Golas ([id number]) on 03/14/2023 3:11:29 AM  I have reviewed the labs performed to date as well as medications administered while in observation.  Recent changes in the last 24 hours include TOC saw pt but waiting for TTS.  Pt does have DM and diabetic coordinator was consulted and recommended semglee  25units and then sliding scale for meals and bed which was ordered.  Plan  Current plan is for waiting for TTS eval.    Doretha Folks, MD 03/14/23 [phone]

## 2023-03-14 NOTE — Progress Notes (Signed)
 Per chart review, pt requested to speak with behavioral health provider and is awaiting a TTS evaluation. TOC will sign off at this time. Please place a new consult if pt is released from TTS' care and has TOC needs.

## 2023-03-14 NOTE — Inpatient Diabetes Management (Signed)
 Inpatient Diabetes Program Recommendations  AACE/ADA: New Consensus Statement on Inpatient Glycemic Control   Target Ranges:  Prepandial:   less than 140 mg/dL      Peak postprandial:   less than 180 mg/dL (1-2 hours)      Critically ill patients:  140 - 180 mg/dL    Latest Reference Range & Units 03/13/23 20:51 03/14/23 03:50 03/14/23 06:35 03/14/23 08:07  Glucose-Capillary 70 - 99 mg/dL 633 (H) 526 (H) 705 (H) 214 (H)    Latest Reference Range & Units 03/13/23 10:03 03/13/23 21:19  CO2 22 - 32 mmol/L 21 18 (L)  Glucose 70 - 99 mg/dL 419 (HH) 595 (H)  Anion gap 5 - 15   14    Latest Reference Range & Units 03/13/23 21:19  Beta-Hydroxybutyric Acid 0.05 - 0.27 mmol/L 0.12    Review of Glycemic Control  Diabetes history: DM2 Outpatient Diabetes medications: Metformin  XR 1000 mg BID, Tradjenta  5 mg daily, Lantus  40 units daily Current orders for Inpatient glycemic control: Tradjenta  5 mg daily  Inpatient Diabetes Program Recommendations:    Insulin : Please use Glycemic Control order set to order Semglee  25 units daily (to start now), CBGs AC&HS, Novolog  0-9 units TID with meals and Novolog  0-5 units at bedtime.  NOTE: Patient in ED with headache and abnormal labs. Noted patient has been placed in psychiatric observation due to the need to provide a safe environment while obtaining psychiatric consultation and evaluation. In reviewing the chart, noted patient was recently inpatient 02/24/23-03/01/23 and was seen by inpatient diabetes coordinator on 02/26/23. Patient was discharged on 03/01/23 new to insulin  and was taught how to use an insulin  pen on 02/26/23.   Thanks, Earnie Gainer, RN, MSN, CDCES Diabetes Coordinator Inpatient Diabetes Program [phone] (Team Pager from 8am to 5pm)

## 2023-03-14 NOTE — ED Notes (Signed)
 [facility] called pts significant other [name]) for collateral. [facility] left a HIPAA compliant message to return the call.   Jacquelynn Cree, [facility]  03/14/23
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
## 2023-03-15 DIAGNOSIS — F331 Major depressive disorder, recurrent, moderate: Secondary | ICD-10-CM

## 2023-03-15 DIAGNOSIS — F411 Generalized anxiety disorder: Secondary | ICD-10-CM

## 2023-03-15 LAB — CBG MONITORING, ED
Glucose-Capillary: 175 mg/dL — ABNORMAL HIGH (ref 70–99)
Glucose-Capillary: 183 mg/dL — ABNORMAL HIGH (ref 70–99)
Glucose-Capillary: 254 mg/dL — ABNORMAL HIGH (ref 70–99)
Glucose-Capillary: 127 mg/dL — ABNORMAL HIGH (ref 70–99)
Glucose-Capillary: 209 mg/dL — ABNORMAL HIGH (ref 70–99)

## 2023-03-15 LAB — COMPREHENSIVE METABOLIC PANEL
ALT: 12 U/L (ref 0–44)
AST: 17 U/L (ref 15–41)
Albumin: 3.2 g/dL — ABNORMAL LOW (ref 3.5–5.0)
Alkaline Phosphatase: 67 U/L (ref 38–126)
Anion gap: 8 (ref 5–15)
BUN: 13 mg/dL (ref 8–23)
CO2: 23 mmol/L (ref 22–32)
Calcium: 8.4 mg/dL — ABNORMAL LOW (ref 8.9–10.3)
Chloride: 105 mmol/L (ref 98–111)
Creatinine, Ser: 1.43 mg/dL — ABNORMAL HIGH (ref 0.44–1.00)
GFR, Estimated: 37 mL/min — ABNORMAL LOW (ref >60)
Glucose, Bld: 168 mg/dL — ABNORMAL HIGH (ref 70–99)
Potassium: 3.2 mmol/L — ABNORMAL LOW (ref 3.5–5.1)
Sodium: 136 mmol/L (ref 135–145)
Total Bilirubin: 0.5 mg/dL (ref 0.0–1.2)
Total Protein: 6.2 g/dL — ABNORMAL LOW (ref 6.5–8.1)

## 2023-03-15 LAB — PARATHYROID HORMONE, INTACT (NO CA): PTH: 52 pg/mL (ref 16–77)

## 2023-03-15 LAB — SARS CORONAVIRUS 2 BY RT PCR: SARS Coronavirus 2 by RT PCR: NEGATIVE

## 2023-03-15 LAB — FRUCTOSAMINE: Fructosamine: 423 umol/L — ABNORMAL HIGH (ref 205–285)
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
## 2023-03-15 NOTE — Group Note (Unsigned)
 Date:  03/15/2023 Time:  4:08 PM  Group Topic/Focus:  Self Care:   The focus of this group is to help patients understand the importance of self-care in order to improve or restore emotional, physical, spiritual, interpersonal, and financial health.     Participation Level:  {B[facility] PARTICIPATION OZCZO:77735}  Participation Quality:  {BHH PARTICIPATION QUALITY:22265}  Affect:  {BHH AFFECT:22266}  Cognitive:  {B[facility] COGNITIVE:22267}  Insight: {BHH Insight2:20797}  Engagement in Group:  {B[facility] ENGAGEMENT IN HMNLE:77731}  Modes of Intervention:  {BHH MODES OF INTERVENTION:22269}  Additional Comments:  ***  Kasey Ewings 03/15/2023, 4:08 PM

## 2023-03-15 NOTE — ED Provider Notes (Signed)
 Emergency Medicine Observation Re-evaluation Note  Maria Fox is a 78 y.o. female, seen on rounds today.  Pt initially presented to the ED for complaints of Headache and abnormal labs Currently, the patient is asleep.  Pt is here with sadness.  She has been medically cleared and is getting observed overnight.  Physical Exam  BP (!) 158/77 (BP Location: Left Arm)   Pulse 87   Temp 98.2 F (36.8 C) (Oral)   Resp 18   Ht 5' 3 (1.6 m)   Wt 59 kg   SpO2 94%   BMI 23.04 kg/m  Physical Exam General: asleep Cardiac: rr Lungs: clear Psych: calm  ED Course / MDM  EKG:EKG Interpretation Date/Time:  Thursday March 13 2023 20:06:02 EST Ventricular Rate:  76 PR Interval:  66 QRS Duration:  142 QT Interval:  435 QTC Calculation: 490 R Axis:   -60  Text Interpretation: Sinus rhythm Short PR interval Right bundle branch block LVH with IVCD and secondary repol abnrm Borderline prolonged QT interval Confirmed by Midge Golas ([id number]) on 03/14/2023 3:11:29 AM  I have reviewed the labs performed to date as well as medications administered while in observation.  Recent changes in the last 24 hours include psych consult.  Plan  Current plan is for overnight obs.    Dean Clarity, MD 03/15/23 [phone]

## 2023-03-15 NOTE — Progress Notes (Signed)
 [facility] LCSW Progress Note   03/15/2023    1:49 PM  Maria Fox   [id number]   Type of Contact and Topic:  Psychiatric Bed Placement   Pt accepted to [facility]   Patient meets inpatient criteria per Cathaleen Adam, PMHNP   The attending provider will be Dr. Cam   Call report to [phone]  Randolm Bolognese, Paramedic @ [facility] notified.     Pt scheduled  to arrive at [facility] TODAY.    Durga Saldarriaga, MSW, LCSW-A  1:51 PM 03/15/2023

## 2023-03-15 NOTE — Group Note (Unsigned)
 Date:  03/15/2023 Time:  8:45 PM  Group Topic/Focus:  Recovery Goals:   The focus of this group is to identify appropriate goals for recovery and establish a plan to achieve them. Self Care:   The focus of this group is to help patients understand the importance of self-care in order to improve or restore emotional, physical, spiritual, interpersonal, and financial health.     Participation Level:  {B[facility] PARTICIPATION OZCZO:77735}  Participation Quality:  {BHH PARTICIPATION QUALITY:22265}  Affect:  {BHH AFFECT:22266}  Cognitive:  {BHH COGNITIVE:22267}  Insight: {BHH Insight2:20797}  Engagement in Group:  {BHH ENGAGEMENT IN HMNLE:77731}  Modes of Intervention:  {BHH MODES OF INTERVENTION:22269}  Additional Comments:  ***  Maria Fox 03/15/2023, 8:45 PM

## 2023-03-16 LAB — COMPREHENSIVE METABOLIC PANEL
ALT: 13 U/L (ref 0–44)
AST: 14 U/L — ABNORMAL LOW (ref 15–41)
Albumin: 3.3 g/dL — ABNORMAL LOW (ref 3.5–5.0)
Alkaline Phosphatase: 69 U/L (ref 38–126)
Anion gap: 10 (ref 5–15)
BUN: 16 mg/dL (ref 8–23)
CO2: 21 mmol/L — ABNORMAL LOW (ref 22–32)
Calcium: 8.6 mg/dL — ABNORMAL LOW (ref 8.9–10.3)
Chloride: 102 mmol/L (ref 98–111)
Creatinine, Ser: 1.8 mg/dL — ABNORMAL HIGH (ref 0.44–1.00)
GFR, Estimated: 28 mL/min — ABNORMAL LOW (ref >60)
Glucose, Bld: 307 mg/dL — ABNORMAL HIGH (ref 70–99)
Potassium: 3.7 mmol/L (ref 3.5–5.1)
Sodium: 133 mmol/L — ABNORMAL LOW (ref 135–145)
Total Bilirubin: 0.5 mg/dL (ref 0.0–1.2)
Total Protein: 6.6 g/dL (ref 6.5–8.1)

## 2023-03-16 LAB — CBG MONITORING, ED
Glucose-Capillary: 159 mg/dL — ABNORMAL HIGH (ref 70–99)
Glucose-Capillary: 289 mg/dL — ABNORMAL HIGH (ref 70–99)
Glucose-Capillary: 328 mg/dL — ABNORMAL HIGH (ref 70–99)
Glucose-Capillary: 93 mg/dL (ref 70–99)

## 2023-03-16 MED ORDER — ARIPIPRAZOLE 2 MG PO TABS
2.0000 mg | ORAL_TABLET | Freq: Every day | ORAL | Status: DC
  Administered 2023-03-17: 2 mg via ORAL
  Filled 2023-03-16: qty 1

## 2023-03-16 MED ORDER — POTASSIUM CHLORIDE CRYS ER 20 MEQ PO TBCR
40.0000 meq | EXTENDED_RELEASE_TABLET | Freq: Once | ORAL | Status: AC
  Administered 2023-03-16: 40 meq via ORAL
  Filled 2023-03-16: qty 2

## 2023-03-16 MED ORDER — SODIUM CHLORIDE 0.9 % IV BOLUS
1000.0000 mL | Freq: Once | INTRAVENOUS | Status: AC
  Administered 2023-03-16: 1000 mL via INTRAVENOUS

## 2023-03-16 NOTE — ED Provider Notes (Signed)
 Emergency Medicine Observation Re-evaluation Note  Maria Fox is a 78 y.o. female, seen on rounds today.  Pt initially presented to the ED for complaints of Headache and abnormal labs Currently, the patient is resting quietly.  Physical Exam  BP (!) 142/73 (BP Location: Right Arm)   Pulse 76   Temp 98.2 F (36.8 C) (Oral)   Resp 15   Ht 5' 3 (1.6 m)   Wt 59 kg   SpO2 97%   BMI 23.04 kg/m  Physical Exam General: No acute distress Cardiac: Well-perfused Lungs: Nonlabored Psych: Calm  ED Course / MDM  EKG:EKG Interpretation Date/Time:  Thursday March 13 2023 20:06:02 EST Ventricular Rate:  76 PR Interval:  66 QRS Duration:  142 QT Interval:  435 QTC Calculation: 490 R Axis:   -60  Text Interpretation: Sinus rhythm Short PR interval Right bundle branch block LVH with IVCD and secondary repol abnrm Borderline prolonged QT interval Confirmed by Midge Golas ([id number]) on 03/14/2023 3:11:29 AM  I have reviewed the labs performed to date as well as medications administered while in observation.  Recent changes in the last 24 hours include psychiatry evaluation.  Plan  Current plan is for medication adjustments and psychiatric placement.    Towana Ozell BROCKS, MD 03/16/23 [phone]
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
## 2023-03-16 NOTE — Progress Notes (Signed)
 Cardiology Office Note:  .   Date:  03/25/2023  ID:  Maria Fox, DOB Jun 16, 1945, MRN [id number] PCP: Rilla Baller, MD  [facility] Providers Cardiologist:  Wilbert Bihari, MD    Patient Profile: .      PMH Coronary artery disease S/p CABG x 3 in 2016 Chronic right bundle branch block Orthostatic hypotension thought to be from autonomic dysfunction from DM Hypertension Atrial flutter Hyperlipidemia Ventricular tachycardia Chronic HFpEF  She established with cardiology with Dr. Claudene in January 2016 for ventricular tachycardia. Nuclear stress test shwed normal perfusion but EF was reduced to 45%. 2D echo chowed EF 65%.  She was then admitted in [facility] [place]  with chest pain and diagnosed with NSTEMI and underwent cath which revealed three-vessel CAD and subsequently underwent CABG x 3.  History of dizziness with multifocal infarcts by MRI in the corona radiata bilaterally felt to have occurred during her CABG.  She also has chronic diastolic CHF but not on diuretics due to orthostatic hypotension.  She was hospitalized in 2019 with dyspnea on exertion and nuclear stress test which showed ischemia.  She underwent cath which revealed occluded mid LAD and significant disease in the mid left circumflex.  She underwent DES to the left circumflex, patent LIMA to LAD, SVG to OM 3 was occluded.  The SVG to the diagonal could not be found and was assumed to be occluded as well.  She has been on medical management.  Despite of LCx she continued to have DOE and was referred to pulmonary and diagnosed with emphysema.  Lexiscan  Myoview  for chest pain in 2020 showed no ischemia.  During laparoscopic cholecystectomy April 2021 she had narrow complex tachycardia and was seen by cardiology.  She also had acute DVT/PE.  Her SVT was felt to be atrial flutter with 2-1 block.  She was already on apixaban  for DVT/PE.  Last cardiology clinic visit was 04/05/2022 with Dr. Bihari.  She was not having  any anginal symptoms. Toprol  XL was reduced to 25 mg daily due to soft BP.  Atrial flutter felt to have occurred in the setting of acute PE/DVT.  No further episodes to her awareness.  She was advised to return in 6 months for follow-up.  Admission 03/15/2023 for depression.        History of Present Illness: .   Maria Fox is a  78 y.o. female    Discussed the use of AI scribe software for clinical note transcription with the patient, who gave verbal consent to proceed.   ROS:        Studies Reviewed: .         Risk Assessment/Calculations:    CHA2DS2-VASc Score = 9   This indicates a 12.2% annual risk of stroke. The patient's score is based upon: CHF History: 1 HTN History: 1 Diabetes History: 1 Stroke History: 2 Vascular Disease History: 1 Age Score: 2 Gender Score: 1            Physical Exam:   VS:  There were no vitals taken for this visit.   Wt Readings from Last 3 Encounters:  03/13/23 130 lb 1.1 oz (59 kg)  02/24/23 130 lb (59 kg)  12/27/22 136 lb 6 oz (61.9 kg)    GEN: Well nourished, well developed in no acute distress NECK: No JVD; No carotid bruits CARDIAC:RRR, no murmurs, rubs, gallops RESPIRATORY:  Clear to auscultation without rales, wheezing or rhonchi  ABDOMEN: Soft, non-tender, non-distended EXTREMITIES:  No edema; No  deformity     ASSESSMENT AND PLAN: .    CAD: S/p CABG 2016 and DES to midLCx in 2019 with patent LIMA-LAD, occluded SVG to OM3 and presumed occluded SVG to diagonal which could not be found on angiography. Atrial flutter: History of DVT/PE: Hypertension: Hyperlipidemia LDL goal < 55:     Dispo:   Signed, Rosaline Bane, NP-C This encounter was created in error - please disregard.

## 2023-03-16 NOTE — ED Notes (Signed)
 Talked to [facility]    they said they were waiting on PT to due an eval on this patient before transfer    at this time she still has a bed open for her

## 2023-03-16 NOTE — Group Note (Signed)
 Date:  03/16/2023 Time:  9:22 PM  Group Topic/Focus:  Wrap-Up Group:   The focus of this group is to help patients review their daily goal of treatment and discuss progress on daily workbooks.    Participation Level:  Did Not Attend  Participation Quality:      Affect:      Cognitive:      Insight: None  Engagement in Group:      Modes of Intervention:      Additional Comments:    Tommas CHRISTELLA Bunker 03/16/2023, 9:22 PM

## 2023-03-16 NOTE — ED Notes (Signed)
 [facility]G at [phone] no coverage needed per sliding scale.

## 2023-03-16 NOTE — Evaluation (Signed)
 Physical Therapy Evaluation Patient Details Name: Maria Fox MRN: [id number] DOB: October 08, 1945 Today's Date: 03/16/2023  History of Present Illness  Pt is 78 yo female admitted on 03/13/23 with c/o headache and abnormal labs and now with psych consult. Pt with recent admit for UTI/sepsis.   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  This date, pt up to bathroom for toileting and up ambulate 140' in hall with mild instability and no overt LOB noted - distance ltd by fatigue. Pt reports performance not quite at PLOF I usually move much faster.  Pt hopeful for dc to [facility] this date to follow up with psych recommendations.      If plan is discharge home, recommend the following: A little help with walking and/or transfers;A little help with bathing/dressing/bathroom   Can travel by private vehicle        Equipment Recommendations None recommended by PT  Recommendations for Other Services       Functional Status Assessment Patient has had a recent decline in their functional status and demonstrates the ability to make significant improvements in function in a reasonable and predictable amount of time.     Precautions / Restrictions Precautions Precautions: Fall Restrictions Weight Bearing Restrictions Per Provider Order: No      Mobility  Bed Mobility Overal bed mobility: Modified Independent Bed Mobility: Supine to Sit, Sit to Supine     Supine to sit: Modified independent (Device/Increase time) Sit to supine: Modified independent (Device/Increase time)        Transfers Overall transfer level: Needs assistance Equipment  used: None Transfers: Sit to/from Stand Sit to Stand: Supervision, Modified independent (Device/Increase time)           General transfer comment: Pt to/from EOB and comode 2x each    Ambulation/Gait Ambulation/Gait assistance: Contact guard assist, Supervision Gait Distance (Feet): 140 Feet Assistive device: None Gait Pattern/deviations: Step-through pattern, Drifts right/left Gait velocity: decreased     General Gait Details: mildly unsteady most noted with stepping backward but no over LOB.  Distance ltd by fatigue  Stairs            Wheelchair Mobility     Tilt Bed    Modified Rankin (Stroke Patients Only)       Balance Overall balance assessment: Needs assistance Sitting-balance support: No upper extremity supported Sitting balance-Leahy Scale: Good     Standing balance support: No upper extremity supported, Single extremity supported Standing balance-Leahy Scale: Good Standing balance comment: occasional use of UE to balance                             Pertinent Vitals/Pain Pain Assessment Pain Assessment: No/denies pain    Home Living Family/patient expects to be discharged to:: Private residence Living Arrangements: Other (Comment) Available Help at Discharge: Friend(s) Type of Home: House Home Access: Stairs to enter Entrance Stairs-Rails: Doctor, General Practice of Steps: 2   Home Layout: One level Home Equipment: Agricultural Consultant (2 wheels);Cane - single point;Tub bench      Prior Function Prior Level of Function : Independent/Modified Independent             Mobility Comments:  Pt ambulated without AD, reports could ambulate in community; per chart 1 fall in yard 3 months ago ADLs Comments: Pt reports ind with self care, [name] does the cooking and cleaning and driving     Extremity/Trunk Assessment   Upper Extremity Assessment Upper Extremity Assessment: Generalized weakness    Lower Extremity  Assessment Lower Extremity Assessment: Generalized weakness    Cervical / Trunk Assessment Cervical / Trunk Assessment: Normal  Communication   Communication Communication: No apparent difficulties  Cognition Arousal: Alert Behavior During Therapy: WFL for tasks assessed/performed Overall Cognitive Status: Within Functional Limits for tasks assessed                                 General Comments: Oriented to person, place, and month. Disoriented to year.        General Comments      Exercises     Assessment/Plan    PT Assessment Patient needs continued PT services  PT Problem List Decreased strength;Decreased range of motion;Decreased activity tolerance;Decreased balance;Decreased mobility;Decreased cognition;Decreased knowledge of use of DME;Decreased safety awareness       PT Treatment Interventions DME instruction;Functional mobility training;Balance training;Patient/family education;Gait training;Therapeutic activities;Neuromuscular re-education;Stair training;Therapeutic exercise    PT Goals (Current goals can be found in the Care Plan section)  Acute Rehab PT Goals Patient Stated Goal: REgain IND PT Goal Formulation: With patient/family Time For Goal Achievement: 03/30/23 Potential to Achieve Goals: Good    Frequency Min 1X/week     Co-evaluation               AM-PAC PT 6 Clicks Mobility  Outcome Measure Help needed turning from your back to your side while in a flat bed without using bedrails?: None Help needed moving from lying on your back to sitting on the side of a flat bed without using bedrails?: A Little Help needed moving to and from a bed to a chair (including a wheelchair)?: A Little Help needed standing up from a chair using your arms (e.g., wheelchair or bedside chair)?: A Little Help needed to walk in hospital room?: A Little Help needed climbing 3-5 steps with a railing? : A Little 6 Click Score: 19    End of  Session Equipment Utilized During Treatment: Gait belt Activity Tolerance: Patient tolerated treatment well Patient left: Other (comment) (In bathroom with nursing) Nurse Communication: Mobility status;Other (comment) PT Visit Diagnosis: Other abnormalities of gait and mobility (R26.89);Unsteadiness on feet (R26.81)    Time: 9069-9051 PT Time Calculation (min) (ACUTE ONLY): 18 min   Charges:   PT Evaluation $PT Eval Low Complexity: 1 Low   PT General Charges $$ ACUTE PT VISIT: 1 Visit         Katrinka Acton PT Acute Rehabilitation Services Pager [phone] Office [phone]   Kaitlynn Tramontana 03/16/2023, 10:08 AM

## 2023-03-16 NOTE — Progress Notes (Signed)
 Patient has been denied by [facility] due to acuity. Patient meets BH inpatient criteria per [facility], PHMNP. Patient has been faxed out to the following facilities:   [facility] [address], [place] [address] [phone]  [facility] Patient Placement [facility] [place] [phone]  [facility] [address] [place] [address] [phone]  [facility] [address] [place] [address] [phone]  [facility] [address], [place] [address] [phone]  [facility] [address] [place] [address] [phone]  [facility] EFAX [address] [place], [place] [phone]  [facility] [address] [place], [place] [address] [phone]  [facility] [address] [place], [place] [address] [phone]  [facility] [address] [place] [address] [phone]  [facility] [address] [place] [address] [phone]  [facility] [address] [facility] [place] [address] [phone]  [facility] [address], [place] [address] [phone]  [facility] [address], [place] [address] [phone]    Bunnie Gallop, MSW, LCSW-A  9:59 AM 03/16/2023

## 2023-03-17 ENCOUNTER — Inpatient Hospital Stay
Admission: AD | Admit: 2023-03-17 | Discharge: 2023-03-26 | DRG: 885 | Disposition: A | Discharge status: Home / Self Care | Payer: 59 | Source: Intra-hospital | Attending: Psychiatry | Admitting: Psychiatry

## 2023-03-17 ENCOUNTER — Other Ambulatory Visit: Payer: Self-pay

## 2023-03-17 ENCOUNTER — Encounter: Payer: Self-pay | Admitting: Nurse Practitioner

## 2023-03-17 DIAGNOSIS — Z7984 Long term (current) use of oral hypoglycemic drugs: Secondary | ICD-10-CM

## 2023-03-17 DIAGNOSIS — Z8673 Personal history of transient ischemic attack (TIA), and cerebral infarction without residual deficits: Secondary | ICD-10-CM

## 2023-03-17 DIAGNOSIS — E119 Type 2 diabetes mellitus without complications: Secondary | ICD-10-CM

## 2023-03-17 DIAGNOSIS — R2681 Unsteadiness on feet: Secondary | ICD-10-CM | POA: Diagnosis not present

## 2023-03-17 DIAGNOSIS — Z8679 Personal history of other diseases of the circulatory system: Secondary | ICD-10-CM

## 2023-03-17 DIAGNOSIS — E1143 Type 2 diabetes mellitus with diabetic autonomic (poly)neuropathy: Secondary | ICD-10-CM | POA: Diagnosis present

## 2023-03-17 DIAGNOSIS — E11649 Type 2 diabetes mellitus with hypoglycemia without coma: Secondary | ICD-10-CM | POA: Diagnosis present

## 2023-03-17 DIAGNOSIS — I82442 Acute embolism and thrombosis of left tibial vein: Secondary | ICD-10-CM | POA: Diagnosis present

## 2023-03-17 DIAGNOSIS — E1165 Type 2 diabetes mellitus with hyperglycemia: Secondary | ICD-10-CM | POA: Diagnosis not present

## 2023-03-17 DIAGNOSIS — Z7901 Long term (current) use of anticoagulants: Secondary | ICD-10-CM

## 2023-03-17 DIAGNOSIS — I130 Hypertensive heart and chronic kidney disease with heart failure and stage 1 through stage 4 chronic kidney disease, or unspecified chronic kidney disease: Secondary | ICD-10-CM | POA: Diagnosis present

## 2023-03-17 DIAGNOSIS — Z20822 Contact with and (suspected) exposure to covid-19: Secondary | ICD-10-CM | POA: Diagnosis not present

## 2023-03-17 DIAGNOSIS — N1832 Chronic kidney disease, stage 3b: Secondary | ICD-10-CM | POA: Diagnosis not present

## 2023-03-17 DIAGNOSIS — Z882 Allergy status to sulfonamides status: Secondary | ICD-10-CM

## 2023-03-17 DIAGNOSIS — Z83438 Family history of other disorder of lipoprotein metabolism and other lipidemia: Secondary | ICD-10-CM | POA: Diagnosis not present

## 2023-03-17 DIAGNOSIS — Z86718 Personal history of other venous thrombosis and embolism: Secondary | ICD-10-CM

## 2023-03-17 DIAGNOSIS — F419 Anxiety disorder, unspecified: Secondary | ICD-10-CM | POA: Diagnosis present

## 2023-03-17 DIAGNOSIS — Z8249 Family history of ischemic heart disease and other diseases of the circulatory system: Secondary | ICD-10-CM | POA: Diagnosis not present

## 2023-03-17 DIAGNOSIS — Z8616 Personal history of COVID-19: Secondary | ICD-10-CM | POA: Diagnosis not present

## 2023-03-17 DIAGNOSIS — Z9841 Cataract extraction status, right eye: Secondary | ICD-10-CM

## 2023-03-17 DIAGNOSIS — Z885 Allergy status to narcotic agent status: Secondary | ICD-10-CM

## 2023-03-17 DIAGNOSIS — Z951 Presence of aortocoronary bypass graft: Secondary | ICD-10-CM | POA: Diagnosis not present

## 2023-03-17 DIAGNOSIS — I480 Paroxysmal atrial fibrillation: Secondary | ICD-10-CM | POA: Diagnosis present

## 2023-03-17 DIAGNOSIS — K219 Gastro-esophageal reflux disease without esophagitis: Secondary | ICD-10-CM | POA: Diagnosis present

## 2023-03-17 DIAGNOSIS — E8881 Metabolic syndrome: Secondary | ICD-10-CM | POA: Diagnosis not present

## 2023-03-17 DIAGNOSIS — Z833 Family history of diabetes mellitus: Secondary | ICD-10-CM | POA: Diagnosis not present

## 2023-03-17 DIAGNOSIS — Z79899 Other long term (current) drug therapy: Secondary | ICD-10-CM

## 2023-03-17 DIAGNOSIS — F333 Major depressive disorder, recurrent, severe with psychotic symptoms: Secondary | ICD-10-CM | POA: Diagnosis present

## 2023-03-17 DIAGNOSIS — R519 Headache, unspecified: Secondary | ICD-10-CM | POA: Diagnosis not present

## 2023-03-17 DIAGNOSIS — I2510 Atherosclerotic heart disease of native coronary artery without angina pectoris: Secondary | ICD-10-CM | POA: Diagnosis not present

## 2023-03-17 DIAGNOSIS — Z7902 Long term (current) use of antithrombotics/antiplatelets: Secondary | ICD-10-CM

## 2023-03-17 DIAGNOSIS — E785 Hyperlipidemia, unspecified: Secondary | ICD-10-CM | POA: Diagnosis present

## 2023-03-17 DIAGNOSIS — E1122 Type 2 diabetes mellitus with diabetic chronic kidney disease: Secondary | ICD-10-CM | POA: Diagnosis not present

## 2023-03-17 DIAGNOSIS — Z955 Presence of coronary angioplasty implant and graft: Secondary | ICD-10-CM | POA: Diagnosis not present

## 2023-03-17 DIAGNOSIS — Z794 Long term (current) use of insulin: Secondary | ICD-10-CM

## 2023-03-17 DIAGNOSIS — I10 Essential (primary) hypertension: Secondary | ICD-10-CM | POA: Diagnosis present

## 2023-03-17 DIAGNOSIS — F332 Major depressive disorder, recurrent severe without psychotic features: Secondary | ICD-10-CM | POA: Diagnosis present

## 2023-03-17 DIAGNOSIS — I509 Heart failure, unspecified: Secondary | ICD-10-CM | POA: Diagnosis not present

## 2023-03-17 DIAGNOSIS — I5032 Chronic diastolic (congestive) heart failure: Secondary | ICD-10-CM | POA: Diagnosis present

## 2023-03-17 DIAGNOSIS — Z888 Allergy status to other drugs, medicaments and biological substances status: Secondary | ICD-10-CM

## 2023-03-17 DIAGNOSIS — Z91030 Bee allergy status: Secondary | ICD-10-CM

## 2023-03-17 DIAGNOSIS — N1831 Chronic kidney disease, stage 3a: Secondary | ICD-10-CM | POA: Diagnosis present

## 2023-03-17 DIAGNOSIS — Z825 Family history of asthma and other chronic lower respiratory diseases: Secondary | ICD-10-CM

## 2023-03-17 DIAGNOSIS — N390 Urinary tract infection, site not specified: Secondary | ICD-10-CM | POA: Diagnosis present

## 2023-03-17 DIAGNOSIS — R2689 Other abnormalities of gait and mobility: Secondary | ICD-10-CM | POA: Diagnosis not present

## 2023-03-17 DIAGNOSIS — Z880 Allergy status to penicillin: Secondary | ICD-10-CM

## 2023-03-17 DIAGNOSIS — F329 Major depressive disorder, single episode, unspecified: Secondary | ICD-10-CM | POA: Diagnosis not present

## 2023-03-17 LAB — GLUCOSE, CAPILLARY
Glucose-Capillary: 178 mg/dL — ABNORMAL HIGH (ref 70–99)
Glucose-Capillary: 236 mg/dL — ABNORMAL HIGH (ref 70–99)
Glucose-Capillary: 285 mg/dL — ABNORMAL HIGH (ref 70–99)

## 2023-03-17 LAB — CBG MONITORING, ED: Glucose-Capillary: 140 mg/dL — ABNORMAL HIGH (ref 70–99)

## 2023-03-17 MED ORDER — GABAPENTIN 100 MG PO CAPS
100.0000 mg | ORAL_CAPSULE | Freq: Three times a day (TID) | ORAL | Status: DC
  Administered 2023-03-17 – 2023-03-26 (×28): 100 mg via ORAL
  Filled 2023-03-17 (×28): qty 1

## 2023-03-17 MED ORDER — HYDROCORTISONE 1 % EX CREA
TOPICAL_CREAM | Freq: Three times a day (TID) | CUTANEOUS | Status: DC
  Administered 2023-03-19 – 2023-03-26 (×9): 1 via TOPICAL
  Filled 2023-03-17 (×2): qty 28

## 2023-03-17 MED ORDER — ATORVASTATIN CALCIUM 10 MG PO TABS
40.0000 mg | ORAL_TABLET | Freq: Every evening | ORAL | Status: DC
  Administered 2023-03-17 – 2023-03-25 (×9): 40 mg via ORAL
  Filled 2023-03-17 (×10): qty 4

## 2023-03-17 MED ORDER — LINAGLIPTIN 5 MG PO TABS
5.0000 mg | ORAL_TABLET | Freq: Every day | ORAL | Status: DC
  Administered 2023-03-18 – 2023-03-19 (×2): 5 mg via ORAL
  Filled 2023-03-17 (×2): qty 1

## 2023-03-17 MED ORDER — APIXABAN 2.5 MG PO TABS: 2.5000 mg | ORAL_TABLET | Freq: Two times a day (BID) | ORAL | Status: DC

## 2023-03-17 MED ORDER — ALBUTEROL SULFATE (2.5 MG/3ML) 0.083% IN NEBU: 3.0000 mL | INHALATION_SOLUTION | RESPIRATORY_TRACT | Status: DC | PRN

## 2023-03-17 MED ORDER — ACETAMINOPHEN 325 MG PO TABS
650.0000 mg | ORAL_TABLET | ORAL | Status: DC | PRN
  Administered 2023-03-17 – 2023-03-26 (×19): 650 mg via ORAL
  Filled 2023-03-17 (×19): qty 2

## 2023-03-17 MED ORDER — LORAZEPAM 0.5 MG PO TABS
0.5000 mg | ORAL_TABLET | ORAL | Status: DC | PRN
  Administered 2023-03-17 – 2023-03-25 (×3): 0.5 mg via ORAL
  Filled 2023-03-17 (×3): qty 1

## 2023-03-17 MED ORDER — MAGNESIUM HYDROXIDE 400 MG/5ML PO SUSP: 30.0000 mL | Freq: Every day | ORAL | Status: DC | PRN

## 2023-03-17 MED ORDER — ONDANSETRON HCL 4 MG PO TABS: 4.0000 mg | ORAL_TABLET | Freq: Three times a day (TID) | ORAL | Status: DC | PRN

## 2023-03-17 MED ORDER — DULOXETINE HCL 60 MG PO CPEP: 60.0000 mg | ORAL_CAPSULE | Freq: Every day | ORAL | Status: DC

## 2023-03-17 MED ORDER — DULOXETINE HCL 60 MG PO CPEP
60.0000 mg | ORAL_CAPSULE | Freq: Every day | ORAL | Status: DC
  Administered 2023-03-18 – 2023-03-26 (×9): 60 mg via ORAL
  Filled 2023-03-17 (×9): qty 1

## 2023-03-17 MED ORDER — METOPROLOL SUCCINATE ER 25 MG PO TB24
25.0000 mg | ORAL_TABLET | Freq: Every day | ORAL | Status: DC
  Administered 2023-03-18 – 2023-03-26 (×8): 25 mg via ORAL
  Filled 2023-03-17 (×9): qty 1

## 2023-03-17 MED ORDER — GLIPIZIDE 10 MG PO TABS
10.0000 mg | ORAL_TABLET | Freq: Every day | ORAL | Status: DC
  Administered 2023-03-18 – 2023-03-19 (×2): 10 mg via ORAL
  Filled 2023-03-17 (×2): qty 1

## 2023-03-17 MED ORDER — APIXABAN 5 MG PO TABS
5.0000 mg | ORAL_TABLET | Freq: Two times a day (BID) | ORAL | Status: DC
  Administered 2023-03-17 – 2023-03-26 (×18): 5 mg via ORAL
  Filled 2023-03-17 (×18): qty 1

## 2023-03-17 MED ORDER — PANTOPRAZOLE SODIUM 40 MG PO TBEC
40.0000 mg | DELAYED_RELEASE_TABLET | Freq: Every day | ORAL | Status: DC
  Administered 2023-03-17 – 2023-03-26 (×10): 40 mg via ORAL
  Filled 2023-03-17 (×10): qty 1

## 2023-03-17 MED ORDER — ACETAMINOPHEN 325 MG PO TABS: 650.0000 mg | ORAL_TABLET | Freq: Four times a day (QID) | ORAL | Status: DC | PRN

## 2023-03-17 MED ORDER — CLOPIDOGREL BISULFATE 75 MG PO TABS
75.0000 mg | ORAL_TABLET | Freq: Every day | ORAL | Status: DC
  Administered 2023-03-18 – 2023-03-26 (×9): 75 mg via ORAL
  Filled 2023-03-17 (×9): qty 1

## 2023-03-17 MED ORDER — ALUM & MAG HYDROXIDE-SIMETH 200-200-20 MG/5ML PO SUSP: 30.0000 mL | ORAL | Status: DC | PRN

## 2023-03-17 MED ORDER — ARIPIPRAZOLE 2 MG PO TABS: 2.0000 mg | ORAL_TABLET | Freq: Every day | ORAL | Status: DC

## 2023-03-17 MED ORDER — ENSURE ENLIVE PO LIQD: 237.0000 mL | Freq: Two times a day (BID) | ORAL | Status: DC

## 2023-03-17 MED ORDER — METFORMIN HCL ER 500 MG PO TB24
500.0000 mg | ORAL_TABLET | Freq: Every day | ORAL | Status: DC
  Administered 2023-03-18 – 2023-03-24 (×7): 500 mg via ORAL
  Filled 2023-03-17 (×7): qty 1

## 2023-03-17 MED ORDER — ADULT MULTIVITAMIN W/MINERALS CH
1.0000 | ORAL_TABLET | Freq: Every day | ORAL | Status: DC
  Administered 2023-03-17: 1 via ORAL
  Filled 2023-03-17: qty 1

## 2023-03-17 MED ORDER — OLANZAPINE 5 MG PO TBDP
5.0000 mg | ORAL_TABLET | Freq: Three times a day (TID) | ORAL | Status: DC | PRN
Start: 2023-03-17 — End: 2023-03-17

## 2023-03-17 MED ORDER — OLANZAPINE 5 MG PO TABS
5.0000 mg | ORAL_TABLET | Freq: Every day | ORAL | Status: DC
  Administered 2023-03-17 – 2023-03-18 (×2): 5 mg via ORAL
  Filled 2023-03-17 (×2): qty 1

## 2023-03-17 MED ORDER — METOPROLOL SUCCINATE ER 25 MG PO TB24: 25.0000 mg | ORAL_TABLET | Freq: Every day | ORAL | Status: DC

## 2023-03-17 MED ORDER — INSULIN GLARGINE-YFGN 100 UNIT/ML ~~LOC~~ SOLN
25.0000 [IU] | Freq: Every day | SUBCUTANEOUS | Status: DC
  Administered 2023-03-18: 25 [IU] via SUBCUTANEOUS
  Filled 2023-03-17 (×2): qty 0.25

## 2023-03-17 MED ORDER — ONDANSETRON 4 MG PO TBDP
4.0000 mg | ORAL_TABLET | Freq: Three times a day (TID) | ORAL | Status: DC | PRN
  Administered 2023-03-26: 4 mg via ORAL
  Filled 2023-03-17: qty 1

## 2023-03-17 MED ORDER — OLANZAPINE 10 MG IM SOLR: 5.0000 mg | Freq: Three times a day (TID) | INTRAMUSCULAR | Status: DC | PRN

## 2023-03-17 MED ORDER — INSULIN ASPART 100 UNIT/ML IJ SOLN
0.0000 [IU] | Freq: Every day | INTRAMUSCULAR | Status: DC
  Administered 2023-03-17: 3 [IU] via SUBCUTANEOUS
  Filled 2023-03-17: qty 1

## 2023-03-17 MED ORDER — INSULIN ASPART 100 UNIT/ML IJ SOLN
0.0000 [IU] | Freq: Three times a day (TID) | INTRAMUSCULAR | Status: DC
  Administered 2023-03-17 – 2023-03-18 (×2): 3 [IU] via SUBCUTANEOUS
  Administered 2023-03-18: 2 [IU] via SUBCUTANEOUS
  Administered 2023-03-19: 3 [IU] via SUBCUTANEOUS
  Administered 2023-03-19: 2 [IU] via SUBCUTANEOUS
  Administered 2023-03-20: 3 [IU] via SUBCUTANEOUS
  Administered 2023-03-20: 1 [IU] via SUBCUTANEOUS
  Administered 2023-03-20: 2 [IU] via SUBCUTANEOUS
  Administered 2023-03-21: 5 [IU] via SUBCUTANEOUS
  Administered 2023-03-21: 1 [IU] via SUBCUTANEOUS
  Filled 2023-03-17 (×2): qty 1

## 2023-03-17 MED ORDER — GLUCERNA SHAKE PO LIQD
237.0000 mL | Freq: Three times a day (TID) | ORAL | Status: DC
  Administered 2023-03-17: 237 mL via ORAL
  Filled 2023-03-17 (×2): qty 237

## 2023-03-17 MED ORDER — GLUCERNA SHAKE PO LIQD
237.0000 mL | Freq: Three times a day (TID) | ORAL | Status: DC
  Administered 2023-03-17 – 2023-03-26 (×23): 237 mL via ORAL

## 2023-03-17 NOTE — Progress Notes (Signed)
 NUTRITION ASSESSMENT  Pt identified as at risk on the Malnutrition Screen Tool  INTERVENTION:  -Continue carb modified diet -Glucerna Shake po TID, each supplement provides 220 kcal and 10 grams of protein  -MVI with minerals daily   NUTRITION DIAGNOSIS: Unintentional weight loss related to sub-optimal intake as evidenced by pt report.   Goal: Pt to meet >/= 90% of their estimated nutrition needs.  Monitor:  PO intake  Assessment:   Pt admitted with MDD And GAD. She carries the psychiatric diagnoses of major depressive disorder and anxiety and has a past medical history of diabetes, previous CVA, CAD.   Pt currently on a carb modified diet. No meal completion data available to assess at this time.   Reviewed wt hx; wt has been stable over the past 6 months.   Medications reviewed and include plavix    Lab Results  Component Value Date   HGBA1C 9.6 (H) 02/27/2023   PTA DM medications are 1000 mg metformin  XR BID, 5 mg tradjenta  daily, and 40 units lantus  daily.   Labs reviewed: Na: 133, CBGS: 93-328 (inpatient orders for glycemic control are 5 mg tradjenta  daily, 0-5 units insulin  aspart daily at bedtime, 0-9 units insulin  aspart TID with meals, and 25 units insulin  galrgine-yfgn daily).    78 y.o. female  Height: Ht Readings from Last 1 Encounters:  03/13/23 5' 3 (1.6 m)    Weight: Wt Readings from Last 1 Encounters:  03/13/23 59 kg    Weight Hx: Wt Readings from Last 10 Encounters:  03/13/23 59 kg  02/24/23 59 kg  12/27/22 61.9 kg  12/09/22 60.3 kg  12/03/22 60.3 kg  09/20/22 60.3 kg  08/23/22 59 kg  08/20/22 59 kg  07/21/22 60.3 kg  05/22/22 55.8 kg    BMI:  Body mass index is 23.04 kg/m. BMI WDL.   Estimated Nutritional Needs: Kcal: 25-30 kcal/kg Protein: > 1 gram protein/kg Fluid: 1 ml/kcal  Diet Order:  Diet Order             Diet Carb Modified Fluid consistency: Thin; Room service appropriate? Yes  Diet effective now                   Pt is also offered choice of unit snacks mid-morning and mid-afternoon.  Pt is eating as desired.   Lab results and medications reviewed.   Margery ORN, RD, LDN, CDCES Registered Dietitian III Certified Diabetes Care and Education Specialist If unable to reach this RD, please use RD Inpatient group chat on secure chat between hours of 8am-4 pm daily

## 2023-03-17 NOTE — Progress Notes (Signed)
   03/17/23 2300  Psych Admission Type (Psych Patients Only)  Admission Status Voluntary  Psychosocial Assessment  Patient Complaints Anxiety;Depression;Sadness  Eye Contact Fair  Facial Expression Worried;Anxious  Affect Appropriate to circumstance  Speech Logical/coherent;Soft  Interaction Assertive  Motor Activity Fidgety;Restless  Appearance/Hygiene Disheveled;Poor hygiene;Body odor;In scrubs  Behavior Characteristics Cooperative  Mood Depressed;Anxious;Pleasant  Aggressive Behavior  Effect No apparent injury  Thought Process  Coherency WDL  Content WDL  Delusions None reported or observed  Perception WDL  Hallucination None reported or observed  Judgment Impaired  Confusion None  Danger to Self  Current suicidal ideation? Denies  Danger to Others  Danger to Others None reported or observed

## 2023-03-17 NOTE — Tx Team (Signed)
 Initial Treatment Plan 03/17/2023 4:10 PM [name] FMW:[id number]    PATIENT STRESSORS: Health problems   Other: Depression      PATIENT STRENGTHS: Personnel Officer means  Religious Affiliation  Supportive family/friends    PATIENT IDENTIFIED PROBLEMS: Alterations in mood I am depressed, everything changed since my stroke.    Sleep disturbance I sleep 4-5 hours / night.    Poor Appetite I don't have appetite, I eat much.    Chronic health issues I got lots of illness that bothers me.         DISCHARGE CRITERIA:  Improved stabilization in mood, thinking, and/or behavior Verbal commitment to aftercare and medication compliance  PRELIMINARY DISCHARGE PLAN: Outpatient therapy Return to previous living arrangement  PATIENT/FAMILY INVOLVEMENT: This treatment plan has been presented to and reviewed with the patient, Maria Fox. The patient have been given the opportunity to ask questions and make suggestions.  Vernice Bowker, RN 03/17/2023, 4:10 PM

## 2023-03-17 NOTE — ED Notes (Signed)
 Incontinent of urine. Brief changed. Patient cleansed and dried. Patient's hair noted to be dirty and needs washing. Patient stated she didn't feel up to it right now but agreed to shower to wash it today.
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
## 2023-03-17 NOTE — Progress Notes (Signed)
 Pt admitted to [facility] under voluntary status from [facility]) where she presented initially for hyperglycemia (580 mg/dl) and headaches. Per chart review and nursing report pt reported depression after being cleared medically, requested to be seen by psychiatry department. On arrival to gero-psych unit she presented with fair eye contact, congruent affect, anxious / restless with depressed mood on interactions. Rates her depression and anxiety both 6/10 with major stressor being poor state of health. Per pt I have lots of medical issues that worries me. Since I had my stroke in 2017, I have not been myself when asked of events leading to admission. Reports poor sleeping habits with poor appetite as well I sleep about 4 hours most of the time. I don't really eat much because my appetite is very poor. Report she lives with her boyfriend of 31 years who is very supportive along with her brother as well. Denies all forms of abuse (sexual, physical and emotional). Pt denies etoh or drug use / abuse when assessed. Skin assessment done, redness noted to pt's buttocks and rash, redness to left lateral abdomen. Assigned provider made aware. Cortisone cream ordered. Pt's belongings checked and washed. Ambulatory to unit with walker. Unit orientation done, routines discussed, care plan reviewed with pt and admission documents signed. Pt tolerated lunch and fluids well when offered. Cooperative with CBG monitoring when approached. Emotional support, encouragement and reassurance offered to pt. Q 15 minutes safety checks initiated without incident.

## 2023-03-17 NOTE — ED Notes (Signed)
 Safe transport called to transport patient to [facility].

## 2023-03-17 NOTE — Progress Notes (Signed)
 Pt was accepted to [facility] 03/17/2023 Bed Assignment 207  Address: [address] [place], [place], [place] [address]  [facility] Fax: [phone]  Pt meets inpatient criteria per: Gailen Ivans NP  Attending Physician will be Dr. Charlie Cam HAS  Report can be called to: [phone]  Pt can arrive after DC  Care Team notified:Danika [facility] RN, Charlie Cam DO, Stacey Toben RN    [place] Zachary Lovins MSW, [facility] 03/17/2023 9:21 AM

## 2023-03-17 NOTE — Group Note (Signed)
 Recreation Therapy Group Note   Group Topic:Coping Skills  Group Date: 03/17/2023 Start Time: 1500 End Time: 1600 Facilitators: Celestia Jeoffrey BRAVO, LRT, CTRS Location:  Dayroom  Group Description: Mind Map.  Patient was provided a blank template of a diagram with 32 blank boxes in a tiered system, branching from the center (similar to a bubble chart). LRT directed patients to label the middle of the diagram Coping Skills. LRT and patients then came up with 8 different coping skills as examples. Pt were directed to record their coping skills in the 2nd tier boxes closest to the center.  Patients would then share their coping skills with the group as LRT wrote them out. LRT gave a handout of 99 different coping skills at the end of group.   Goal Area(s) Addressed: Patients will be able to define "coping skills". Patient will identify new coping skills.  Patient will increase communication.   Affect/Mood: N/A   Participation Level: Did not attend    Clinical Observations/Individualized Feedback: Maria Fox did not attend group.   Plan: Continue to engage patient in RT group sessions 2-3x/week.   Jeoffrey BRAVO Celestia, LRT, CTRS 03/17/2023 4:55 PM

## 2023-03-17 NOTE — ED Notes (Signed)
 Report given to [facility] psych at [facility]. Stated I can call for transport after 1130

## 2023-03-17 NOTE — ED Provider Notes (Signed)
 Emergency Medicine Observation Re-evaluation Note  Maria Fox is a 78 y.o. female, seen on rounds today.  Pt initially presented to the ED for complaints of Headache and abnormal labs Currently, the patient is resting comfortably.  Physical Exam  BP 136/61 (BP Location: Left Arm)   Pulse 77   Temp 98.4 F (36.9 C) (Oral)   Resp 18   Ht 5' 3 (1.6 m)   Wt 59 kg   SpO2 98%   BMI 23.04 kg/m  Physical Exam General: NAD   ED Course / MDM  EKG:EKG Interpretation Date/Time:  Thursday March 13 2023 20:06:02 EST Ventricular Rate:  76 PR Interval:  66 QRS Duration:  142 QT Interval:  435 QTC Calculation: 490 R Axis:   -60  Text Interpretation: Sinus rhythm Short PR interval Right bundle branch block LVH with IVCD and secondary repol abnrm Borderline prolonged QT interval Confirmed by Midge Golas ([id number]) on 03/14/2023 3:11:29 AM  I have reviewed the labs performed to date as well as medications administered while in observation.  Recent changes in the last 24 hours include no acute events reported.  Plan  Current plan is for Inpatient placement.    Laurice Maude BROCKS, MD 03/17/23 [phone]

## 2023-03-17 NOTE — BHH Suicide Risk Assessment (Signed)
 [facility] Admission Suicide Risk Assessment   Nursing information obtained from:    Demographic factors:    Current Mental Status:    Loss Factors:    Historical Factors:    Risk Reduction Factors:     Total Time spent with patient: 1 hour Principal Problem: Major depressive disorder, recurrent severe without psychotic features ([name]) Diagnosis:  Principal Problem:   Major depressive disorder, recurrent severe without psychotic features ([name])  Subjective Data:  Maria Fox is a 78 year old white female who presents voluntarily from [facility] with a 62-month history of depression.  She was started on Cymbalta  by her primary care physician on an outpatient basis.  She has never been psychiatrically hospitalized and has never seen a psychiatrist.  She is currently living with her boyfriend.  They have been together for the past 34 years.  She has never been married and does not have any kids but he has kids from a previous marriage.  She tells me that she was sent to the emergency room after she was found to have high blood sugars at her PCP.  She has multiple medical problems and was admitted to the hospital last month.  Her transfer was delayed because her kidney function was decreasing and she needed potassium replacement and IV fluids.  She denies suicidal ideation.  No history of psychosis.  No past suicide attempts.  She endorses anhedonia, difficulty sleeping, depressed mood, and anxiety.  She tells me that the Cymbalta  has helped so I am going to leave her on that and start some Zyprexa  at bedtime.  She also complains of memory loss.   Continued Clinical Symptoms:    The Alcohol Use Disorders Identification Test, Guidelines for Use in Primary Care, Second Edition.  [employer] [facility]). Score between 0-7:  no or low risk or alcohol related problems. Score between 8-15:  moderate risk of alcohol related problems. Score between 16-19:  high risk of alcohol related problems. Score 20 or  above:  warrants further diagnostic evaluation for alcohol dependence and treatment.   CLINICAL FACTORS:   Depression:   Anhedonia   Musculoskeletal: Strength & Muscle Tone: within normal limits Gait & Station: unsteady Patient leans: N/A  Psychiatric Specialty Exam:  Presentation  General Appearance:  Appropriate for Environment; Casual  Eye Contact: Good  Speech: Clear and Coherent; Normal Rate  Speech Volume: Normal  Handedness: Right   Mood and Affect  Mood: Depressed  Affect: Congruent   Thought Process  Thought Processes: Coherent; Goal Directed  Descriptions of Associations:Intact  Orientation:Full (Time, Place and Person)  Thought Content:Logical  History of Schizophrenia/Schizoaffective disorder:No data recorded Duration of Psychotic Symptoms:No data recorded Hallucinations:No data recorded Ideas of Reference:None  Suicidal Thoughts:No data recorded Homicidal Thoughts:No data recorded  Sensorium  Memory: Immediate Good; Recent Good; Remote Good  Judgment: Fair  Insight: Fair   Art Therapist  Concentration: Good  Attention Span: Good  Recall: Good  Fund of Knowledge: Good  Language: Good   Psychomotor Activity  Psychomotor Activity:No data recorded  Assets  Assets: Communication Skills; Desire for Improvement; Financial Resources/Insurance; Housing; Leisure Time; Physical Health; Resilience; Social Support; Transportation   Sleep  Sleep:No data recorded    Blood pressure 129/72, pulse 82, temperature (!) 97.2 F (36.2 C), resp. rate 18, SpO2 100%. There is no height or weight on file to calculate BMI.   COGNITIVE FEATURES THAT CONTRIBUTE TO RISK:  None    SUICIDE RISK:   Minimal: No identifiable suicidal ideation.  Patients presenting  with no risk factors but with morbid ruminations; may be classified as minimal risk based on the severity of the depressive symptoms  PLAN OF CARE: See orders  I  certify that inpatient services furnished can reasonably be expected to improve the patient's condition.   Charlie Dallas Salines, DO 03/17/2023, 2:41 PM

## 2023-03-17 NOTE — Plan of Care (Signed)
  Problem: Activity: Goal: Interest or engagement in leisure activities will improve Outcome: Progressing   Problem: Coping: Goal: Coping ability will improve Outcome: Progressing   Problem: Safety: Goal: Ability to disclose and discuss suicidal ideas will improve Outcome: Progressing   Problem: Safety: Goal: Periods of time without injury will increase Outcome: Progressing

## 2023-03-18 DIAGNOSIS — F332 Major depressive disorder, recurrent severe without psychotic features: Secondary | ICD-10-CM | POA: Diagnosis not present

## 2023-03-18 LAB — CBC WITH DIFFERENTIAL/PLATELET
Abs Immature Granulocytes: 0.02 10*3/uL (ref 0.00–0.07)
Basophils Absolute: 0.1 10*3/uL (ref 0.0–0.1)
Basophils Relative: 1 %
Eosinophils Absolute: 0.4 10*3/uL (ref 0.0–0.5)
Eosinophils Relative: 7 %
HCT: 30.9 % — ABNORMAL LOW (ref 36.0–46.0)
Hemoglobin: 10.8 g/dL — ABNORMAL LOW (ref 12.0–15.0)
Immature Granulocytes: 0 %
Lymphocytes Relative: 35 %
Lymphs Abs: 2.2 10*3/uL (ref 0.7–4.0)
MCH: 30.8 pg (ref 26.0–34.0)
MCHC: 35 g/dL (ref 30.0–36.0)
MCV: 88 fL (ref 80.0–100.0)
Monocytes Absolute: 0.6 10*3/uL (ref 0.1–1.0)
Monocytes Relative: 9 %
Neutro Abs: 2.9 10*3/uL (ref 1.7–7.7)
Neutrophils Relative %: 48 %
Platelets: 217 10*3/uL (ref 150–400)
RBC: 3.51 MIL/uL — ABNORMAL LOW (ref 3.87–5.11)
RDW: 13.4 % (ref 11.5–15.5)
WBC: 6.2 10*3/uL (ref 4.0–10.5)
nRBC: 0 % (ref 0.0–0.2)

## 2023-03-18 LAB — COMPREHENSIVE METABOLIC PANEL
ALT: 10 U/L (ref 0–44)
AST: 12 U/L — ABNORMAL LOW (ref 15–41)
Albumin: 3 g/dL — ABNORMAL LOW (ref 3.5–5.0)
Alkaline Phosphatase: 77 U/L (ref 38–126)
Anion gap: 10 (ref 5–15)
BUN: 23 mg/dL (ref 8–23)
CO2: 21 mmol/L — ABNORMAL LOW (ref 22–32)
Calcium: 8.6 mg/dL — ABNORMAL LOW (ref 8.9–10.3)
Chloride: 104 mmol/L (ref 98–111)
Creatinine, Ser: 1.22 mg/dL — ABNORMAL HIGH (ref 0.44–1.00)
GFR, Estimated: 45 mL/min — ABNORMAL LOW (ref >60)
Glucose, Bld: 204 mg/dL — ABNORMAL HIGH (ref 70–99)
Potassium: 4 mmol/L (ref 3.5–5.1)
Sodium: 135 mmol/L (ref 135–145)
Total Bilirubin: 0.4 mg/dL (ref 0.0–1.2)
Total Protein: 5.9 g/dL — ABNORMAL LOW (ref 6.5–8.1)

## 2023-03-18 LAB — GLUCOSE, CAPILLARY
Glucose-Capillary: 187 mg/dL — ABNORMAL HIGH (ref 70–99)
Glucose-Capillary: 213 mg/dL — ABNORMAL HIGH (ref 70–99)
Glucose-Capillary: 67 mg/dL — ABNORMAL LOW (ref 70–99)
Glucose-Capillary: 76 mg/dL (ref 70–99)
Glucose-Capillary: 114 mg/dL — ABNORMAL HIGH (ref 70–99)
Glucose-Capillary: 67 mg/dL — ABNORMAL LOW (ref 70–99)

## 2023-03-18 MED ORDER — ADULT MULTIVITAMIN W/MINERALS CH
1.0000 | ORAL_TABLET | Freq: Every day | ORAL | Status: DC
  Administered 2023-03-19 – 2023-03-26 (×8): 1 via ORAL
  Filled 2023-03-18 (×8): qty 1

## 2023-03-18 NOTE — Group Note (Signed)
 Recreation Therapy Group Note   Group Topic:Health and Wellness  Group Date: 03/18/2023 Start Time: 1100 End Time: 1135 Facilitators: Celestia Jeoffrey FORBES ARTICE ROLLIE Location:  Dayroom  Group Description: Seated Exercise. LRT discussed the mental and physical benefits of exercise. LRT and group discussed how physical activity can be used as a coping skill. Pt's and LRT followed along to an exercise video on the TV screen that provided a visual representation and audio description of every exercise performed. Pt's encouraged to listen to their bodies and stop at any time if they experience feelings of discomfort or pain. Pts were encouraged to drink water and stay hydrated.   Goal Area(s) Addressed: Patient will learn benefits of physical activity. Patient will identify exercise as a coping skill.  Patient will follow multistep directions. Patient will try a new leisure interest.    Affect/Mood: N/A   Participation Level: Did not attend    Clinical Observations/Individualized Feedback: Maria Fox did not attend group.   Plan: Continue to engage patient in RT group sessions 2-3x/week.   Jeoffrey FORBES Celestia, LRT, CTRS 03/18/2023 12:52 PM

## 2023-03-18 NOTE — BHH Counselor (Signed)
 CSW spoke with the patient's partner, [name] Costa, [phone].  He reports that patient was admitted because of her brother.    He reports that family became concerned for the patient not knowing what things are going on and sometimes you know more than me  He reports that her brother panics a lot.  I can tell they're brother and sister.    He reports that per the brother the hospital called him sating that patient needed to be admitted to the hospital immediately for her blood glucose.   He reports that patient's blood sugar runs high anyway and she don't do that insulin .  He reports that she's a negative person and down on herself.  He reports that she changed following the heart attack in 2016.  He reports that patient wont even go in and get her soft drinks.  He reports that patient will avoid going places because she is down on herself.  He reports that patient will refuse to go to the restroom but then tell me that she is about to burst when I am driving home.  He denies that patient is a danger to self or others.   He denies access to weapons.   He reports that patient will make comments of the Jacquetta needs to take her.   Sherryle Margo, MSW, LCSW 03/18/2023 4:33 PM

## 2023-03-18 NOTE — Plan of Care (Signed)
 D: Pt alert and oriented. Pt reports experiencing anxiety/depression at this time. Pt reports experiencing a 7/10 headache pain at this time, prn medication given. Pt denies experiencing any SI/HI, or AVH at this time.   A: Scheduled medications administered to pt, per MD orders. Support and encouragement provided. Frequent verbal contact made. Routine safety checks conducted q15 minutes.   R: No adverse drug reactions noted. Pt verbally contracts for safety at this time. Pt compliant with medications. Pt interacts minimally with others on the unit. Pt remains safe at this time. Plan of care ongoing.  Problem: Activity: Goal: Interest or engagement in leisure activities will improve Outcome: Not Progressing Goal: Imbalance in normal sleep/wake cycle will improve Outcome: Not Progressing

## 2023-03-18 NOTE — Group Note (Signed)
 Date:  03/18/2023 Time:  10:36 AM  Group Topic/Focus:  Managing Feelings:   The focus of this group is to identify what feelings patients have difficulty handling and develop a plan to handle them in a healthier way upon discharge.    Participation Level:  Did Not Attend   Camellia HERO Kailash Hinze 03/18/2023, 10:36 AM

## 2023-03-18 NOTE — Evaluation (Signed)
 Occupational Therapy Evaluation Patient Details Name: Maria Fox MRN: [id number] DOB: June 17, 1945 Today's Date: 03/18/2023   History of Present Illness Pt is a 78 y/o F with PMH including CABG/CAD, atrial flutter, CKD3, DM2, CVA (2017), vitligo, memory deficit. Admitted to geripsych with depression following admission at [facility] with high glucose levels.   Clinical Impression   Patient received for OT evaluation. See flowsheet below for details of function. Generally, patient requiring MIN A for bed mobility, supervision for functional mobility, and MOD (I) for ADLs. A+Ox4, although endorses some cognitive deficits such as giving directions or recalling information.  Patient will benefit from continued OT while in acute care to work on cognitive strategies.       If plan is discharge home, recommend the following: Direct supervision/assist for medications management;Direct supervision/assist for financial management;Assistance with cooking/housework;Assist for transportation    Functional Status Assessment  Patient has had a recent decline in their functional status and demonstrates the ability to make significant improvements in function in a reasonable and predictable amount of time.  Equipment Recommendations  None recommended by OT    Recommendations for Other Services       Precautions / Restrictions Precautions Precautions: Fall Restrictions Weight Bearing Restrictions Per Provider Order: No      Mobility Bed Mobility Overal bed mobility: Needs Assistance Bed Mobility: Supine to Sit     Supine to sit: Min assist     General bed mobility comments: Pt sleeping in supine when OT arrived; appeared to wake up and then fall back asleep again quickly; needed OT assist to move legs towards the EOB, then she began to wake up and assisted with transfer; anticipate that if she was fully alert during task she would be able to complete bed mobility MOD (I) with rail.     Transfers Overall transfer level: Needs assistance Equipment used: Rolling walker (2 wheels) Transfers: Sit to/from Stand Sit to Stand: Min assist           General transfer comment: Needing MIN A for sit to stand from EOB; pt had just woken up, so could've been limited from grogginess that quickly resolved.      Balance Overall balance assessment: Mild deficits observed, not formally tested Sitting-balance support: Feet supported Sitting balance-Leahy Scale: Good     Standing balance support: Bilateral upper extremity supported Standing balance-Leahy Scale: Fair Standing balance comment: using UE on RW or grab bar or sink for balance                           ADL either performed or assessed with clinical judgement   ADL Overall ADL's : At baseline                                       General ADL Comments: Pt performed toileting (clothing management and hygiene included) MOD (I) with use of grab bar next to toilet for steadying; was able to turn around and flush toilet as well when finished. Completed grooming (brushing teeth, washing hands- although did not utilize soap) while standing at sink without assistance. Used RW during mobility to the day room (approx 50 feet) with supervision.     Vision Patient Visual Report: No change from baseline (not wearing glasses today)       Perception         Praxis  Pertinent Vitals/Pain Pain Assessment Pain Assessment: No/denies pain     Extremity/Trunk Assessment Upper Extremity Assessment Upper Extremity Assessment: Overall WFL for tasks assessed   Lower Extremity Assessment Lower Extremity Assessment: Overall WFL for tasks assessed   Cervical / Trunk Assessment Cervical / Trunk Assessment: Normal   Communication Communication Communication: No apparent difficulties   Cognition Arousal: Alert Behavior During Therapy: WFL for tasks assessed/performed Overall Cognitive  Status: Within Functional Limits for tasks assessed                                 General Comments: Pt is oriented x4, although it takes her a moment to come up with the correct year. She does endorse difficulty with her memory recently; states that she is having trouble with giving people directions or taking extra time to think about words sometimes when telling a story. Is open to working with OT during hospitalization on cognition.     General Comments  Pt on room air throughout session; no signs of distress.    Exercises     Shoulder Instructions      Home Living Family/patient expects to be discharged to:: Private residence Living Arrangements: Spouse/significant other Available Help at Discharge: Family;Available 24 hours/day;Available PRN/intermittently (significant other available 24/7; brother available PRN (he still works)) Type of Home: House Home Access: Stairs to enter [employer] of Steps: 2 Entrance Stairs-Rails: Right;Left Home Layout: One level     Bathroom Shower/Tub: Chief Strategy Officer: Standard Bathroom Accessibility: Yes   Home Equipment: Agricultural Consultant (2 wheels);Shower seat          Prior Functioning/Environment Prior Level of Function : Needs assist             Mobility Comments: States that she ambulates recently with RW (unable to state exactly how long she's been using it). Denies recent falls; states that she had one fall in the past year approx 6 months ago (sometime in summer) in the yard without AD. ADLs Comments: States she is (I) with BADLs; [name] (significant other) does most IADLs (driving, cooking, housework, medication management); pills come in pill packets and [name] ensures that pt takes them correctly. Pt states that she enjoys watching TV Erminio, Jodie Cunning, game shows), enjoys music (Reba Donah Dale Leonce Zachary Susana; and rock & roll). States she used to enjoy tap dancing and  decopage crafts, but have not done either of these recently. 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 Her significant other is also retired and home all the time.        OT Problem List: Decreased cognition;Decreased activity tolerance      OT Treatment/Interventions: Cognitive remediation/compensation    OT Goals(Current goals can be found in the care plan section) Acute Rehab OT Goals Patient Stated Goal: Get back home safely OT Goal Formulation: With patient Time For Goal Achievement: 04/01/23 Potential to Achieve Goals: Good ADL Goals Additional ADL Goal #1: Patient will utilize 2 new compensatory strategies for cognition independently by d/c.  OT Frequency: Min 1X/week    Co-evaluation              AM-PAC OT 6 Clicks Daily Activity     Outcome Measure Help from another person eating meals?: None Help from another person taking care of personal grooming?: None Help from another person toileting, which includes using toliet, bedpan, or urinal?: None Help  from another person bathing (including washing, rinsing, drying)?: None Help from another person to put on and taking off regular upper body clothing?: None Help from another person to put on and taking off regular lower body clothing?: None 6 Click Score: 24   End of Session Equipment Utilized During Treatment: Rolling walker (2 wheels) Nurse Communication: Mobility status  Activity Tolerance: Patient tolerated treatment well Patient left:  (seated in chair in day room with PT arriving to work with patient)  OT Visit Diagnosis: Other symptoms and signs involving cognitive function                Time: 1415-1500 OT Time Calculation (min): 45 min Charges:  OT General Charges $OT Visit: 1 Visit OT Evaluation $OT Eval Moderate Complexity: 1 Mod OT Treatments $Self Care/Home Management : 8-22 mins $Therapeutic Activity: 8-22 mins  Jasmine Arlean Shams, MS, OTR/L  Jasmine Shams 03/18/2023, 3:37 PM

## 2023-03-18 NOTE — Progress Notes (Signed)
 Initial Nutrition Assessment  DOCUMENTATION CODES:   Not applicable  INTERVENTION:   Glucerna Shake po TID, each supplement provides 220 kcal and 10 grams of protein  MVI po daily   NUTRITION DIAGNOSIS:   Inadequate oral intake related to social / environmental circumstances as evidenced by meal completion < 25%.  GOAL:   Patient will meet greater than or equal to 90% of their needs  MONITOR:   PO intake, Supplement acceptance  REASON FOR ASSESSMENT:   Malnutrition Screening Tool    ASSESSMENT:   78 y/o female with h/o MDD, HTN, HLD, CAD s/p CABG x 3, CVA, CHF, IDA, CKD III, GERD, DVT and stroke who is admitted voluntarily from [facility] with a 6-month history of depression.  Pt reports poor appetite and oral intake pta. Pt documented to be eating <25% of meals in hospital. Pt is drinking Glucerna supplements. RD will add MVI daily. Per chart, pt appears weight stable pta.   Medications reviewed and include: plavix , glipizide , insulin , metformin , protonix   Labs reviewed: K 4.0 wnl, creat 1.22(H) Hgb 10.8(L), Hct 30.9(L) Cbgs- 213, 187 x 24 hrs  AIC 9.6(H)- 12/19  Diet Order:   Diet Order             Diet Carb Modified Fluid consistency: Thin; Room service appropriate? Yes  Diet effective now                  EDUCATION NEEDS:   No education needs have been identified at this time  Skin:  Skin Assessment: Reviewed RN Assessment  Last BM:  1/6- type 6  Height:   Ht Readings from Last 1 Encounters:  03/17/23 5' 3 (1.6 m)    Weight:   Wt Readings from Last 1 Encounters:  03/17/23 59.2 kg   BMI:  Body mass index is 23.12 kg/m.  Estimated Nutritional Needs:   Kcal:  1400-1600kcal/day  Protein:  70-80g/day  Fluid:  1.4-1.6L/day  Augustin Shams MS, RD, LDN If unable to be reached, please send secure chat to RD inpatient available from 8:00a-4:00p daily

## 2023-03-18 NOTE — BHH Counselor (Signed)
 Adult Comprehensive Assessment  Patient ID: Maria Fox, female   DOB: October 18, 1945, 78 y.o.   MRN: [id number]  Information Source: Information source: Patient  Current Stressors:  Patient states their primary concerns and needs for treatment are:: my blood sugar was too high, and I was a little confused Patient states their goals for this hospitilization and ongoing recovery are:: get to where I am not confused Educational / Learning stressors: Pt denies. Employment / Job issues: Pt denies. Family Relationships: Pt denies. Financial / Lack of resources (include bankruptcy): Pt denies. Housing / Lack of housing: Pt denies. Physical health (include injuries & life threatening diseases): diabetes, 3 bypass surgeries on my heart, mini stroke Social relationships: Pt denies. Substance abuse: Pt denies. Bereavement / Loss: Pt denies.  Living/Environment/Situation:  Living Arrangements: Spouse/significant other Living conditions (as described by patient or guardian): WNL Who else lives in the home?: Maria Fox (pts partner) How long has patient lived in current situation?: 20 something What is atmosphere in current home: Loving, Supportive, Comfortable  Family History:  Marital status: Long term relationship Long term relationship, how long?: 31 years What types of issues is patient dealing with in the relationship?: Pt denies. Are you sexually active?: No What is your sexual orientation?: males Has your sexual activity been affected by drugs, alcohol, medication, or emotional stress?: No it's been since my last bypass surgery Does patient have children?: No  Childhood History:  By whom was/is the patient raised?: Both parents Description of patient's relationship with caregiver when they were a child: great Patient's description of current relationship with people who raised him/her: Pt reports that her parents are deceased. How were you disciplined when you got in  trouble as a child/adolescent?: a talking to, and I knew what would happen if I dind't listen Does patient have siblings?: Yes Number of Siblings: 1 Description of patient's current relationship with siblings: very good Did patient suffer any verbal/emotional/physical/sexual abuse as a child?: No Did patient suffer from severe childhood neglect?: No Has patient ever been sexually abused/assaulted/raped as an adolescent or adult?: No Was the patient ever a victim of a crime or a disaster?: No Witnessed domestic violence?: No Has patient been affected by domestic violence as an adult?: No  Education:  Highest grade of school patient has completed: 12th Currently a student?: No Learning disability?: No  Employment/Work Situation:   Employment Situation: Retired Therapist, [employer]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?: No  Financial Resources:   Surveyor, Quantity resources: [employer], Medicare, Medicaid Does patient have a lawyer or guardian?: No  Alcohol/Substance Abuse:   What has been your use of drugs/alcohol within the last 12 months?: Pt denies. If attempted suicide, did drugs/alcohol play a role in this?: No Alcohol/Substance Abuse Treatment Hx: Denies past history Has alcohol/substance abuse ever caused legal problems?: No  Social Support System:   Patient's Community Support System: Good Describe Community Support System: Maria Fox (partner) and Investment Banker, Corporate (brother) Type of faith/religion: Baptist How does patient's faith help to cope with current illness?: I pray  Leisure/Recreation:   Do You Have Hobbies?: No  Strengths/Needs:   What is the patient's perception of their strengths?: hardworker, friendly, like to accomplish goals Patient states they can use these personal strengths during their treatment to contribute to their recovery: Pt denies. Patient  states these barriers may affect/interfere with their treatment: Pt denies. Patient states  these barriers may affect their return to the community: Pt denies. Other important information patient would like considered in planning for their treatment: Pt denies.  Discharge Plan:   Currently receiving community mental health services: No Patient states concerns and preferences for aftercare planning are: Pt reports that she is open to a referral at discharge. Patient states they will know when they are safe and ready for discharge when: because of my feelings Does patient have access to transportation?: Yes Does patient have financial barriers related to discharge medications?: No Will patient be returning to same living situation after discharge?: Yes  Summary/Recommendations:   Summary and Recommendations (to be completed by the evaluator): Patient is a 78 year old female from [place], [place] [facility] [place]).  She presents to the hospital for concerns of increasing depression.  At initial assessment patient reported feelings of overwhelm with life and stress from it.  Patient, at admission, also reports that she has had feelings of worthlessness and feels that she has taken advantage of her significant other.  She reported that she has felt that he has become increasingly upset with her.  She reported at admission that she felt better off dead than feeling that she is worthless.  However, in assessment with this clinician patient reported that she was admitted for concerns for her blood sugar and increasing confusion.  She identified that her feelings of worthlessness and depression have been a trigger to her current mental health state.  She reports that she does not have a current mental health provider, however is open to a referral at discharge.  Recommendations include: crisis stabilization, therapeutic milieu, encourage group attendance and participation, medication management for mood  stabilization and development of comprehensive mental wellness/sobriety plan.  [name]. 03/18/2023
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
## 2023-03-18 NOTE — [facility] Suicide Risk Assessment (Signed)
 [facility] INPATIENT:  Family/Significant Other Suicide Prevention Education  Suicide Prevention Education:  Education Completed; Maria Fox, partner, [phone] has been identified by the patient as the family member/significant other with whom the patient will be residing, and identified as the person(s) who will aid the patient in the event of a mental health crisis (suicidal ideations/suicide attempt).  With written consent from the patient, the family member/significant other has been provided the following suicide prevention education, prior to the and/or following the discharge of the patient.  The suicide prevention education provided includes the following: Suicide risk factors Suicide prevention and interventions National Suicide Hotline telephone number [employer] assessment telephone number [facility] Emergency Assistance 911 [facility] and/or Residential Mobile Crisis Unit telephone number  Request made of family/significant other to: Remove weapons (e.g., guns, rifles, knives), all items previously/currently identified as safety concern.   Remove drugs/medications (over-the-counter, prescriptions, illicit drugs), all items previously/currently identified as a safety concern.  The family member/significant other verbalizes understanding of the suicide prevention education information provided.  The family member/significant other agrees to remove the items of safety concern listed above.  Maria Fox 03/18/2023, 4:13 PM

## 2023-03-18 NOTE — Plan of Care (Signed)
  Problem: Education: Goal: Utilization of techniques to improve thought processes will improve Outcome: Progressing Goal: Knowledge of the prescribed therapeutic regimen will improve Outcome: Progressing   Problem: Activity: Goal: Interest or engagement in leisure activities will improve Outcome: Progressing Goal: Imbalance in normal sleep/wake cycle will improve Outcome: Progressing   

## 2023-03-18 NOTE — Evaluation (Signed)
 Physical Therapy Evaluation Patient Details Name: Maria Fox MRN: [id number] DOB: [birth year] Today's Date: 03/18/2023  History of Present Illness  Pt is a 78 y/o F with PMH including CABG/CAD, atrial flutter, CKD3, DM2, CVA (2017), vitligo, memory deficit. Initially presented to ER from PCP due to hyperglycemia, AMS; subsequently transferred to gero-psych for management of major depressive disorder.  Clinical Impression  Patient seated with OT in dayroom upon arrival to unit.  Patient alert and oriented to basic information; does require increased time for processing and task initiation intermittently.  Denies pain.  Generally weak and deconditioned throughout all extremities, but no focal weakness or asymmetry appreciated.  Higher-level, dynamic deficits, requiring min assist to maintain balance. BERG 20/56; indicative of high fall risk. Able to complete bed mobility with mod indep; sit/stand, basic transfers and gait (150') with RW, cga/close sup.  Demonstrates triple flexed posture at trunk, hips and knees; shuffling stepping performance; mildly excessive weight shift towards R. Mild/mod sway with head turns and dynamic gait components; do recommend continued use of RW at this time.   Would benefit from skilled PT to address above deficits and promote optimal return to PLOF.; recommend post-acute PT follow up as indicated by interdisciplinary care team.          If plan is discharge home, recommend the following: A little help with walking and/or transfers;A little help with bathing/dressing/bathroom   Can travel by private vehicle        Equipment Recommendations    Recommendations for Other Services       Functional Status Assessment Patient has had a recent decline in their functional status and demonstrates the ability to make significant improvements in function in a reasonable and predictable amount of time.     Precautions / Restrictions Precautions Precautions:  Fall Restrictions Weight Bearing Restrictions Per Provider Order: No      Mobility  Bed Mobility Overal bed mobility: Modified Independent Bed Mobility: Sit to Supine                Transfers Overall transfer level: Needs assistance Equipment used: Rolling walker (2 wheels) Transfers: Sit to/from Stand Sit to Stand: Contact guard assist           General transfer comment: multiple attempts and use of UEs required to complete sit/stand from chair and edge of bed surfaces    Ambulation/Gait Ambulation/Gait assistance: Contact guard assist, Supervision Gait Distance (Feet): 150 Feet Assistive device: None         General Gait Details: triple flexed posture at trunk, hips and knees; shuffling stepping performance; mildly excessive weight shift towards R.  Mild/mod sway with head turns and dynamic gait components; do recommend continued use of RW at this time  Stairs            Wheelchair Mobility     Tilt Bed    Modified Rankin (Stroke Patients Only)       Balance Overall balance assessment: Needs assistance Sitting-balance support: No upper extremity supported, Feet supported Sitting balance-Leahy Scale: Good     Standing balance support: Bilateral upper extremity supported Standing balance-Leahy Scale: Fair                   Standardized Balance Assessment Standardized Balance Assessment : Berg Balance Test Berg Balance Test Sit to Stand: Able to stand  independently using hands Standing Unsupported: Able to stand 2 minutes with supervision Sitting with Back Unsupported but Feet Supported on Floor or Stool: Able to  sit safely and securely 2 minutes Stand to Sit: Controls descent by using hands Transfers: Able to transfer with verbal cueing and /or supervision Standing Unsupported with Eyes Closed: Able to stand 10 seconds with supervision Standing Ubsupported with Feet Together: Needs help to attain position and unable to hold for 15  seconds From Standing, Reach Forward with Outstretched Arm: Reaches forward but needs supervision From Standing Position, Pick up Object from Floor: Unable to try/needs assist to keep balance From Standing Position, Turn to Look Behind Over each Shoulder: Needs supervision when turning Turn 360 Degrees: Needs assistance while turning Standing Unsupported, Alternately Place Feet on Step/Stool: Needs assistance to keep from falling or unable to try Standing Unsupported, One Foot in Front: Loses balance while stepping or standing Standing on One Leg: Unable to try or needs assist to prevent fall Total Score: 20         Pertinent Vitals/Pain Pain Assessment Pain Assessment: No/denies pain    Home Living Family/patient expects to be discharged to:: Private residence Living Arrangements: Spouse/significant other Available Help at Discharge: Family;Available 24 hours/day;Available PRN/intermittently Type of Home: House Home Access: Stairs to enter Entrance Stairs-Rails: Right;Left Entrance Stairs-Number of Steps: 2   Home Layout: One level Home Equipment: Agricultural Consultant (2 wheels);Shower seat      Prior Function Prior Level of Function : Needs assist             Mobility Comments: States that she ambulates recently with RW (unable to state exactly how long she's been using it). Denies recent falls; states that she had one fall in the past year approx 6 months ago (sometime in summer) in the yard without AD. ADLs Comments: States she is (I) with BADLs; [name] (significant other) does most IADLs (driving, cooking, housework, medication management); pills come in pill packets and [name] ensures that pt takes them correctly. Pt states that she enjoys watching TV Erminio, Jodie Cunning, game shows), enjoys music ([place] Leonce Zachary Susana; and rock & roll). States she used to enjoy tap dancing and decopage crafts, but have not done either of these recently. 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 Her significant other is also retired and home all the time.     Extremity/Trunk Assessment   Upper Extremity Assessment Upper Extremity Assessment: Overall WFL for tasks assessed    Lower Extremity Assessment Lower Extremity Assessment: Overall WFL for tasks assessed (grossly at least 4/5 throughout; no sensory deficits reported)    Cervical / Trunk Assessment Cervical / Trunk Assessment: Normal  Communication   Communication Communication: No apparent difficulties  Cognition Arousal: Alert Behavior During Therapy: WFL for tasks assessed/performed Overall Cognitive Status: Within Functional Limits for tasks assessed                                          General Comments General comments (skin integrity, edema, etc.): Pt on room air throughout session; no signs of distress.    Exercises     Assessment/Plan    PT Assessment Patient needs continued PT services  PT Problem List Decreased strength;Decreased range of motion;Decreased activity tolerance;Decreased balance;Decreased mobility;Decreased cognition;Decreased knowledge of use of DME;Decreased safety awareness       PT Treatment Interventions DME instruction;Functional mobility training;Balance training;Patient/family education;Gait training;Therapeutic activities;Neuromuscular re-education;Stair training;Therapeutic exercise    PT Goals (Current goals can be found in  the Care Plan section)  Acute Rehab PT Goals Patient Stated Goal: to get stronger and not use the walker PT Goal Formulation: With patient Time For Goal Achievement: 04/01/23 Potential to Achieve Goals: Good    Frequency Min 1X/week     Co-evaluation               AM-PAC PT 6 Clicks Mobility  Outcome Measure Help needed turning from your back to your side while in a flat bed without using bedrails?: None Help needed moving from lying on your back to  sitting on the side of a flat bed without using bedrails?: None Help needed moving to and from a bed to a chair (including a wheelchair)?: A Little Help needed standing up from a chair using your arms (e.g., wheelchair or bedside chair)?: A Little Help needed to walk in hospital room?: A Little Help needed climbing 3-5 steps with a railing? : A Little 6 Click Score: 20    End of Session   Activity Tolerance: Patient tolerated treatment well Patient left: in bed   PT Visit Diagnosis: Other abnormalities of gait and mobility (R26.89);Unsteadiness on feet (R26.81)    Time: 8565-8490 PT Time Calculation (min) (ACUTE ONLY): 35 min   Charges:   PT Evaluation $PT Eval Moderate Complexity: 1 Mod   PT General Charges $$ ACUTE PT VISIT: 1 Visit       Alexander Aument H. Delores, PT, DPT, NCS 03/18/23, 4:36 PM [phone]

## 2023-03-18 NOTE — Group Note (Signed)
 Recreation Therapy Group Note   Group Topic:Problem Solving  Group Date: 03/18/2023 Start Time: 1500 End Time: 1550 Facilitators: Celestia Jeoffrey BRAVO, LRT, CTRS Location:  Dayroom  Group Description: Life Boat. Patients were given the scenario that they are on a boat that is about to become shipwrecked, leaving them stranded on an island. They are asked to make a list of 10 different items that they want to take with them when they are [place] the [place]. Patients are asked to rank their items from most important to least important, #1 being the most important and #10 being the least. Patients will work individually for the first round to come up with 10 items and then pair up with a peer(s) to condense their list and come up with one list of 10 items between the two of them. Patients or LRT will read aloud the 10 different items to the group after each round. LRT facilitated post-activity processing to discuss how this activity can be used in daily life post discharge.   Goal Area(s) Addressed:  Patient will identify priorities, wants and needs. Patient will communicate with LRT and peers. Patient will work collectively as a administrator, civil service. Patient will work on product manager.    Affect/Mood: N/A   Participation Level: Did not attend    Clinical Observations/Individualized Feedback: Ertha did not attend group.   Plan: Continue to engage patient in RT group sessions 2-3x/week.   Jeoffrey BRAVO Celestia, LRT, CTRS 03/18/2023 5:19 PM

## 2023-03-18 NOTE — Group Note (Signed)
 Date:  03/18/2023 Time:  1:39 AM  Group Topic/Focus:  Goals Group:   The focus of this group is to help patients establish daily goals to achieve during treatment and discuss how the patient can incorporate goal setting into their daily lives to aide in recovery. Wrap-Up Group:   The focus of this group is to help patients review their daily goal of treatment and discuss progress on daily workbooks.    Participation Level:  Minimal  Participation Quality:  Appropriate  Affect:  Appropriate  Cognitive:  Lacking  Insight: Good  Engagement in Group:  Lacking  Modes of Intervention:  Discussion  Additional Comments:    Kristen DEL Synai Prettyman 03/18/2023, 1:39 AM

## 2023-03-18 NOTE — Group Note (Signed)
 LCSW Group Therapy Note  Group Date: 03/18/2023 Start Time: 1315 End Time: 1345   Type of Therapy and Topic:  Group Therapy - Healthy vs Unhealthy Coping Skills  Participation Level:  Did Not Attend   Description of Group The focus of this group was to determine what unhealthy coping techniques typically are used by group members and what healthy coping techniques would be helpful in coping with various problems. Patients were guided in becoming aware of the differences between healthy and unhealthy coping techniques. Patients were asked to identify 2-3 healthy coping skills they would like to learn to use more effectively.  Therapeutic Goals Patients learned that coping is what human beings do all day long to deal with various situations in their lives Patients defined and discussed healthy vs unhealthy coping techniques Patients identified their preferred coping techniques and identified whether these were healthy or unhealthy Patients determined 2-3 healthy coping skills they would like to become more familiar with and use more often. Patients provided support and ideas to each other   Summary of Patient Progress:  X Therapeutic Modalities Cognitive Behavioral Therapy Motivational Interviewing  Lum JONETTA Croft, [place] 03/18/2023  2:53 PM

## 2023-03-19 ENCOUNTER — Encounter: Payer: 59 | Admitting: Family Medicine

## 2023-03-19 DIAGNOSIS — F332 Major depressive disorder, recurrent severe without psychotic features: Secondary | ICD-10-CM | POA: Diagnosis not present

## 2023-03-19 LAB — GLUCOSE, CAPILLARY
Glucose-Capillary: 267 mg/dL — ABNORMAL HIGH (ref 70–99)
Glucose-Capillary: 208 mg/dL — ABNORMAL HIGH (ref 70–99)
Glucose-Capillary: 154 mg/dL — ABNORMAL HIGH (ref 70–99)
Glucose-Capillary: 118 mg/dL — ABNORMAL HIGH (ref 70–99)

## 2023-03-19 MED ORDER — EMPAGLIFLOZIN 10 MG PO TABS
10.0000 mg | ORAL_TABLET | Freq: Every day | ORAL | Status: DC
  Administered 2023-03-20 – 2023-03-24 (×5): 10 mg via ORAL
  Filled 2023-03-19 (×5): qty 1

## 2023-03-19 MED ORDER — LATANOPROST 0.005 % OP SOLN
1.0000 [drp] | Freq: Every day | OPHTHALMIC | Status: DC
  Administered 2023-03-19 – 2023-03-25 (×7): 1 [drp] via OPHTHALMIC
  Filled 2023-03-19 (×2): qty 2.5

## 2023-03-19 MED ORDER — ISOSORBIDE MONONITRATE ER 30 MG PO TB24
15.0000 mg | ORAL_TABLET | Freq: Every day | ORAL | Status: DC
  Administered 2023-03-20 – 2023-03-26 (×7): 15 mg via ORAL
  Filled 2023-03-19 (×8): qty 1

## 2023-03-19 MED ORDER — INSULIN GLARGINE-YFGN 100 UNIT/ML ~~LOC~~ SOLN
20.0000 [IU] | Freq: Every day | SUBCUTANEOUS | Status: DC
  Administered 2023-03-20 – 2023-03-21 (×2): 20 [IU] via SUBCUTANEOUS
  Filled 2023-03-19 (×2): qty 0.2
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
## 2023-03-19 NOTE — Consult Note (Signed)
 CONSULT NOTE    Maria Fox  FMW:[id number] DOB: 11/14/1945 DOA: 03/17/2023 PCP: Rilla Baller, MD     Brief Narrative:   Admitted to geri-psych unit for major depressive episode. Has had evening hypoglycemia the last two nights, consulted for assistance with glucose control.  Patient reports she is compliant with oral medications at home but hasn't started insulin .   Assessment & Plan:   Principal Problem:   Major depressive disorder, recurrent severe without psychotic features ([name]) Active Problems:   Chronic diastolic CHF (congestive heart failure) ([name])   CAD, multiple vessel s/p CABG x 3   Chronic kidney disease, stage 3a ([name])   Non-insulin  treated type 2 diabetes mellitus ([name])   Essential hypertension   S/P CABG x 3   History of cerebrovascular accident (CVA) in adulthood   Acute deep vein thrombosis (DVT) of left tibial vein ([name])   Recurrent UTI   Hx of atrial flutter  # T2DM History poor control (A1c 9.6%) despite several oral meds at home. Advised insulin  but never started. Here has been treated with basal and sliding scale insulin , also glipizide , tradjenta , and metformin . Co-administration of sulfonylureas (glipizide ) and insulin  increases the risk of hypoglycemia. As patient's diabetes was poorly controlled on oral meds, agree with insulin . I advise the following regimen, particularly if patient is willing to start insulin  at home. If patient not willing to take insulin  at home, would advise stopping long-acting insulin  (semglee ). - stop glipizide  (I have discontinued) - continue metformin  500 daily - decrease semglee  to 20 units daily, titrate to achieve fasting glucose of 140-180 - stop tradjenta  - start jardiance  (indicated given co-occurring CAD) - continue to check glucose before meals and at bedtime.   # CAD S/p 3-vessel cabg in 2016 and DES in 2019. Stable, denies chest pain or DOE. - continue home atorvastatin  and plavix   # A-fib Rate  controlled - continue home apixaban , metoprolol  - if creatinine remains below 1.5, can increase apixaban  back to 5 mg twice a day  # HFpEF Appears euvolemic - cont home metoprolo; - resume home imdur   # CKD 3a Kidney function currently at baseline - monitor and renally-dose meds  # HTN BP moderate elevation here - consider starting an agent such as amlodipine  if BPs remain elevated  # Major depressive disorder - per psychiatry team   DVT prophylaxis: apixaban  anticoagulation     Subjective: No complaints, denies pain, currently eating dinner  Objective: Vitals:   03/18/23 0729 03/18/23 1947 03/19/23 0707 03/19/23 0926  BP: (!) 159/62 (!) 123/57 (!) 164/61 (!) 164/61  Pulse: 71 72 86 86  Resp: 16 14 15    Temp: 98.7 F (37.1 C) (!) 97.3 F (36.3 C) (!) 97.2 F (36.2 C)   TempSrc:      SpO2: 99% 100% 98%   Weight:      Height:       No intake or output data in the 24 hours ending 03/19/23 1648 Filed Weights   03/17/23 1350  Weight: 59.2 kg    Examination:  General exam: Appears calm and comfortable  Respiratory system: Clear to auscultation. Respiratory effort normal. Cardiovascular system: S1 & S2 heard, RRR. Soft systolic murmur Gastrointestinal system: Abdomen is nondistended, soft and nontender.   Central nervous system: Alert and oriented. No focal neurological deficits. Extremities: Symmetric 5 x 5 power. Warm, no edema Skin: No rashes, lesions or ulcers Psychiatry: Judgement and insight appear normal.      Data Reviewed: I have personally reviewed  following labs and imaging studies  CBC: Recent Labs  Lab 03/13/23 1003 03/13/23 2119 03/18/23 0621  WBC 7.1 7.1 6.2  NEUTROABS 4.9 3.8 2.9  HGB 11.7* 12.3 10.8*  HCT 35.8* 35.3* 30.9*  MCV 90.4 87.6 88.0  PLT 259.0 274 217   Basic Metabolic Panel: Recent Labs  Lab 03/13/23 1003 03/13/23 2119 03/15/23 1556 03/16/23 1613 03/18/23 0621  NA 134* 132* 136 133* 135  K 4.1 3.3* 3.2* 3.7  4.0  CL 101 100 105 102 104  CO2 21 18* 23 21* 21*  GLUCOSE 580* 404* 168* 307* 204*  BUN 14 12 13 16 23   CREATININE 1.33* 0.99 1.43* 1.80* 1.22*  CALCIUM  8.8 8.8* 8.4* 8.6* 8.6*  MG 1.4*  --   --   --   --   PHOS 3.7  --   --   --   --    GFR: Estimated Creatinine Clearance: 30.9 mL/min (A) (by C-G formula based on SCr of 1.22 mg/dL (H)). Liver Function Tests: Recent Labs  Lab 03/13/23 1003 03/14/23 0439 03/15/23 1556 03/16/23 1613 03/18/23 0621  AST 12 14* 17 14* 12*  ALT 11 14 12 13 10   ALKPHOS 104 89 67 69 77  BILITOT 0.5 0.7 0.5 0.5 0.4  PROT 6.7 6.9 6.2* 6.6 5.9*  ALBUMIN 4.1 3.7 3.2* 3.3* 3.0*   Recent Labs  Lab 03/14/23 0439  LIPASE 49   No results for input(s): AMMONIA in the last 168 hours. Coagulation Profile: No results for input(s): INR, PROTIME in the last 168 hours. Cardiac Enzymes: No results for input(s): CKTOTAL, CKMB, CKMBINDEX, TROPONINI in the last 168 hours. BNP (last 3 results) No results for input(s): PROBNP in the last 8760 hours. HbA1C: No results for input(s): HGBA1C in the last 72 hours. CBG: Recent Labs  Lab 03/18/23 1807 03/18/23 1938 03/19/23 0723 03/19/23 1121 03/19/23 1607  GLUCAP 114* 67* 267* 208* 154*   Lipid Profile: No results for input(s): CHOL, HDL, LDLCALC, TRIG, CHOLHDL, LDLDIRECT in the last 72 hours. Thyroid  Function Tests: No results for input(s): TSH, T4TOTAL, FREET4, T3FREE, THYROIDAB in the last 72 hours. Anemia Panel: No results for input(s): VITAMINB12, FOLATE, FERRITIN, TIBC, IRON , RETICCTPCT in the last 72 hours. Urine analysis:    Component Value Date/Time   COLORURINE YELLOW 03/13/2023 2119   APPEARANCEUR HAZY (A) 03/13/2023 2119   LABSPEC 1.028 03/13/2023 2119   PHURINE 5.0 03/13/2023 2119   GLUCOSEU >=500 (A) 03/13/2023 2119   HGBUR NEGATIVE 03/13/2023 2119   BILIRUBINUR NEGATIVE 03/13/2023 2119   BILIRUBINUR 1+ 12/27/2022 1555   KETONESUR  NEGATIVE 03/13/2023 2119   PROTEINUR NEGATIVE 03/13/2023 2119   UROBILINOGEN 0.2 12/27/2022 1555   UROBILINOGEN 0.2 09/16/2014 0009   NITRITE NEGATIVE 03/13/2023 2119   LEUKOCYTESUR TRACE (A) 03/13/2023 2119   Sepsis Labs: @LABRCNTIP (procalcitonin:4,lacticidven:4)  ) Recent Results (from the past 240 hours)  SARS Coronavirus 2 by RT PCR (hospital order, performed in [facility] hospital lab) *cepheid single result test* Anterior Nasal Swab     Status: None   Collection Time: 03/15/23  3:00 PM   Specimen: Anterior Nasal Swab  Result Value Ref Range Status   SARS Coronavirus 2 by RT PCR NEGATIVE NEGATIVE Final    Comment: (NOTE) SARS-CoV-2 target nucleic acids are NOT DETECTED.  The SARS-CoV-2 RNA is generally detectable in upper and lower respiratory specimens during the acute phase of infection. The lowest concentration of SARS-CoV-2 viral copies this assay can detect is 250 copies / mL. A negative  result does not preclude SARS-CoV-2 infection and should not be used as the sole basis for treatment or other patient management decisions.  A negative result may occur with improper specimen collection / handling, submission of specimen other than nasopharyngeal swab, presence of viral mutation(s) within the areas targeted by this assay, and inadequate number of viral copies (<250 copies / mL). A negative result must be combined with clinical observations, patient history, and epidemiological information.  Fact Sheet for Patients:   roadlaptop.co.za  Fact Sheet for Healthcare Providers: http://kim-miller.com/  This test is not yet approved or  cleared by the [place]  FDA and has been authorized for detection and/or diagnosis of SARS-CoV-2 by FDA under an Emergency Use Authorization (EUA).  This EUA will remain in effect (meaning this test can be used) for the duration of the COVID-19 declaration under Section 564(b)(1) of the Act,  21 U.S.C. section 360bbb-3(b)(1), unless the authorization is terminated or revoked sooner.  Performed at [facility], [address], [place], [place] [address]          Radiology Studies: No results found.      Scheduled Meds:  apixaban   5 mg Oral BID   atorvastatin   40 mg Oral QPM   clopidogrel   75 mg Oral Daily   DULoxetine   60 mg Oral Daily   feeding supplement (GLUCERNA SHAKE)  237 mL Oral TID BM   gabapentin   100 mg Oral TID   glipiZIDE   10 mg Oral QAC breakfast   hydrocortisone  cream   Topical TID   insulin  aspart  0-5 Units Subcutaneous QHS   insulin  aspart  0-9 Units Subcutaneous TID WC   insulin  glargine-yfgn  25 Units Subcutaneous Daily   latanoprost   1 drop Both Eyes QHS   linagliptin   5 mg Oral Daily   metFORMIN   500 mg Oral Q breakfast   metoprolol  succinate  25 mg Oral Daily   multivitamin with minerals  1 tablet Oral Daily   OLANZapine   5 mg Oral QHS   pantoprazole   40 mg Oral Daily   Continuous Infusions:   LOS: 2 days     Devaughn KATHEE Ban, MD [facility]   If 7PM-7AM, please contact night-coverage [email or web address] Password [facility] 03/19/2023, 4:48 PM

## 2023-03-19 NOTE — Inpatient Diabetes Management (Signed)
 Inpatient Diabetes Program Recommendations  AACE/ADA: New Consensus Statement on Inpatient Glycemic Control   Target Ranges:  Prepandial:   less than 140 mg/dL      Peak postprandial:   less than 180 mg/dL (1-2 hours)      Critically ill patients:  140 - 180 mg/dL    Latest Reference Range & Units 03/18/23 07:27 03/18/23 11:56 03/18/23 16:17 03/18/23 17:16 03/18/23 18:07 03/18/23 19:38  Glucose-Capillary 70 - 99 mg/dL 812 (H) 786 (H) 67 (L) 76 114 (H) 67 (L)   Review of Glycemic Control  Diabetes history: DM2 Outpatient Diabetes medications: Metformin  XR 1000 mg BID, Tradjenta  5 mg daily, Lantus  40 units daily Current orders for Inpatient glycemic control: Semglee  25 units daily, Novolog  0-9 units TID with meals, Novolog  0-5 units at bedtime, Tradjenta  5 mg daily, Glipizide  10 mg QAM, Metformin  XR 500 mg QAM  Inpatient Diabetes Program Recommendations:    Insulin : Patient experienced hypoglycemia x2 on 03/18/23.  Please consider discontinuing Semglee .  Thanks, Earnie Gainer, RN, MSN, CDCES Diabetes Coordinator Inpatient Diabetes Program [phone] (Team Pager from 8am to 5pm)

## 2023-03-19 NOTE — Group Note (Signed)
 Date:  03/19/2023 Time:  7:15 AM  Group Topic/Focus:  Wrap-Up Group:   The focus of this group is to help patients review their daily goal of treatment and discuss progress on daily workbooks.    Participation Level:  Active  Participation Quality:  Appropriate  Affect:  Appropriate  Cognitive:  Oriented  Insight: Improving  Engagement in Group:  Improving  Modes of Intervention:  Discussion  Additional Comments:    Maria Fox 03/19/2023, 7:15 AM

## 2023-03-19 NOTE — Progress Notes (Signed)
   03/19/23 0655  15 Minute Checks  Location Bedroom  Visual Appearance Calm  Behavior Composed;Sleeping  Sleep (Behavioral Health Patients Only)  Calculate sleep? (Click Yes once per 24 hr at 0600 safety check) Yes  Documented sleep last 24 hours 12.25

## 2023-03-19 NOTE — Plan of Care (Signed)
 Patient alert and oriented. Denies SI, HI, AVH, and pain. Scheduled medications administered to patient, per MD orders. Support and encouragement provided.  Routine safety checks conducted every 15 minutes. Patient interacts well with others on the unit.  Patient remains safe at this time.   Problem: Education: Goal: Utilization of techniques to improve thought processes will improve Outcome: Progressing Goal: Knowledge of the prescribed therapeutic regimen will improve Outcome: Progressing   Problem: Activity: Goal: Interest or engagement in leisure activities will improve Outcome: Progressing Goal: Imbalance in normal sleep/wake cycle will improve Outcome: Progressing   Problem: Coping: Goal: Coping ability will improve Outcome: Progressing Goal: Will verbalize feelings Outcome: Progressing

## 2023-03-19 NOTE — Progress Notes (Signed)
   03/19/23 1000  Psych Admission Type (Psych Patients Only)  Admission Status Voluntary  Psychosocial Assessment  Patient Complaints Depression  Eye Contact Fair  Facial Expression Flat  Affect Appropriate to circumstance  Speech Logical/coherent  Interaction Assertive  Motor Activity Slow  Appearance/Hygiene Poor hygiene  Behavior Characteristics Cooperative  Mood Depressed  Thought Process  Coherency WDL  Content WDL  Delusions None reported or observed  Perception WDL  Hallucination None reported or observed  Judgment Impaired  Confusion None  Danger to Self  Current suicidal ideation? Denies  Danger to Others  Danger to Others None reported or observed   Dar Note: Patient presents with a flat affect and depressed mood.  Denies suicidal thoughts, auditory and visual hallucinations.  Medications given as prescribed.  Routine safety checks maintained.  Patient ambulatory on the unit with a walker.  Patient is safe on the unit.

## 2023-03-19 NOTE — BH IP Treatment Plan (Signed)
 Interdisciplinary Treatment and Diagnostic Plan Update  03/19/2023 Time of Session: 2:00 PM  Maria Fox MRN: [id number]  Principal Diagnosis: Major depressive disorder, recurrent severe without psychotic features ([name])  Secondary Diagnoses: Principal Problem:   Major depressive disorder, recurrent severe without psychotic features ([name])   Current Medications:  Current Facility-Administered Medications  Medication Dose Route Frequency Provider Last Rate Last Admin   acetaminophen  (TYLENOL ) tablet 650 mg  650 mg Oral Q4H PRN Onuoha, Josephine C, NP   650 mg at 03/18/23 1724   albuterol  (PROVENTIL ) (2.5 MG/3ML) 0.083% nebulizer solution 3 mL  3 mL Inhalation Q4H PRN Cam Charlie Loving, DO       alum & mag hydroxide-simeth (MAALOX/MYLANTA) 200-200-20 MG/5ML suspension 30 mL  30 mL Oral Q4H PRN Onuoha, Josephine C, NP       apixaban  (ELIQUIS ) tablet 5 mg  5 mg Oral BID Cam Charlie Loving, DO   5 mg at 03/19/23 9071   atorvastatin  (LIPITOR) tablet 40 mg  40 mg Oral QPM Onuoha, Josephine C, NP   40 mg at 03/18/23 1700   clopidogrel  (PLAVIX ) tablet 75 mg  75 mg Oral Daily Onuoha, Josephine C, NP   75 mg at 03/19/23 0931   DULoxetine  (CYMBALTA ) DR capsule 60 mg  60 mg Oral Daily Onuoha, Josephine C, NP   60 mg at 03/19/23 0928   feeding supplement (GLUCERNA SHAKE) (GLUCERNA SHAKE) liquid 237 mL  237 mL Oral TID BM Cam Charlie Loving, DO   237 mL at 03/19/23 1311   gabapentin  (NEURONTIN ) capsule 100 mg  100 mg Oral TID Cam Charlie Loving, DO   100 mg at 03/19/23 9071   glipiZIDE  (GLUCOTROL ) tablet 10 mg  10 mg Oral QAC breakfast Cam Charlie Loving, DO   10 mg at 03/19/23 0935   hydrocortisone  cream 1 %   Topical TID Cam Charlie Loving, DO   Given at 03/19/23 0930   insulin  aspart (novoLOG ) injection 0-5 Units  0-5 Units Subcutaneous QHS Onuoha, Josephine C, NP   3 Units at 03/17/23 2154   insulin  aspart (novoLOG ) injection 0-9 Units  0-9 Units Subcutaneous TID WC  Onuoha, Josephine C, NP   3 Units at 03/19/23 1159   insulin  glargine-yfgn (SEMGLEE ) injection 25 Units  25 Units Subcutaneous Daily Onuoha, Josephine C, NP   25 Units at 03/18/23 1058   latanoprost  (XALATAN ) 0.005 % ophthalmic solution 1 drop  1 drop Both Eyes QHS Victoria Ruts, MD       linagliptin  (TRADJENTA ) tablet 5 mg  5 mg Oral Daily Onuoha, Josephine C, NP   5 mg at 03/19/23 9072   LORazepam  (ATIVAN ) tablet 0.5 mg  0.5 mg Oral Q4H PRN Cam Charlie Loving, DO   0.5 mg at 03/17/23 2153   magnesium  hydroxide (MILK OF MAGNESIA) suspension 30 mL  30 mL Oral Daily PRN Onuoha, Josephine C, NP       metFORMIN  (GLUCOPHAGE -XR) 24 hr tablet 500 mg  500 mg Oral Q breakfast Cam Charlie Loving, DO   500 mg at 03/19/23 9072   metoprolol  succinate (TOPROL -XL) 24 hr tablet 25 mg  25 mg Oral Daily Onuoha, Josephine C, NP   25 mg at 03/19/23 9073   multivitamin with minerals tablet 1 tablet  1 tablet Oral Daily Cam Charlie Loving, DO   1 tablet at 03/19/23 9071   OLANZapine  (ZYPREXA ) tablet 5 mg  5 mg Oral QHS Cam Charlie Loving, DO   5 mg at 03/18/23 2124   ondansetron  (ZOFRAN -ODT) disintegrating  tablet 4 mg  4 mg Oral Q8H PRN Cam Charlie Loving, DO       pantoprazole  (PROTONIX ) EC tablet 40 mg  40 mg Oral Daily Cam Charlie Loving, DO   40 mg at 03/19/23 9071   PTA Medications: Medications Prior to Admission  Medication Sig Dispense Refill Last Dose/Taking   acyclovir  ointment (ZOVIRAX ) 5 % Apply 1 Application topically every 3 (three) hours as needed (for fever blisters).      albuterol  (VENTOLIN  HFA) 108 (90 Base) MCG/ACT inhaler Inhale 1-2 puffs into the lungs every 6 (six) hours as needed for wheezing or shortness of breath.      apixaban  (ELIQUIS ) 5 MG TABS tablet Take 1 tablet (5 mg total) by mouth 2 (two) times daily. 60 tablet 5    atorvastatin  (LIPITOR) 40 MG tablet TAKE 1 TABLET BY MOUTH EVERY EVENING 90 tablet 0    Blood Glucose Monitoring Suppl DEVI 1 each by  Does not apply route in the morning, at noon, and at bedtime. May substitute to any manufacturer covered by patient's insurance. 1 each 0    clopidogrel  (PLAVIX ) 75 MG tablet TAKE 1 TABLET BY MOUTH ONCE DAILY 30 tablet 0    DULoxetine  (CYMBALTA ) 60 MG capsule TAKE 1 CAPSULE BY MOUTH ONCE DAILY 90 capsule 0    gabapentin  (NEURONTIN ) 300 MG capsule TAKE ONE CAPSULE BY MOUTH TWICE DAILY (Patient not taking: Reported on 03/14/2023) 180 capsule 1    glipiZIDE  (GLUCOTROL ) 10 MG tablet Take 10 mg by mouth 2 (two) times daily before a meal.      Glucose Blood (BLOOD GLUCOSE TEST STRIPS) STRP 1 each by In Vitro route in the morning, at noon, and at bedtime. May substitute to any manufacturer covered by patient's insurance. 100 strip 0    insulin  glargine (LANTUS ) 100 UNIT/ML Solostar Pen Inject 40 Units into the skin daily. (Patient not taking: Reported on 03/14/2023) 15 mL 11    Insulin  Pen Needle (PEN NEEDLES) 32G X 6 MM MISC Use 1 pen needle daily to inject insulin . 100 each 0    isosorbide  mononitrate (IMDUR ) 30 MG 24 hr tablet TAKE 1/2 TABLET BY MOUTH ONCE DAILY 15 tablet 0    Lancet Device MISC 1 each by Does not apply route in the morning, at noon, and at bedtime. May substitute to any manufacturer covered by patient's insurance. 1 each 0    Lancets Misc. MISC 1 each by Does not apply route in the morning, at noon, and at bedtime. May substitute to any manufacturer covered by patient's insurance. 100 each 0    latanoprost  (XALATAN ) 0.005 % ophthalmic solution Place 1 drop into both eyes at bedtime.       linagliptin  (TRADJENTA ) 5 MG TABS tablet Take 1 tablet (5 mg total) by mouth daily. (Patient not taking: Reported on 03/14/2023) 90 tablet 0    metFORMIN  (GLUCOPHAGE -XR) 500 MG 24 hr tablet TAKE 2 TABLETS BY MOUTH EVERY MORNING 180 tablet 0    metoprolol  succinate (TOPROL -XL) 25 MG 24 hr tablet TAKE 1 TABLET BY MOUTH ONCE DAILY 30 tablet 0    nitroGLYCERIN  (NITROSTAT ) 0.4 MG SL tablet Place 1 tablet (0.4 mg  total) under the tongue every 5 (five) minutes as needed for chest pain. 25 tablet 10    ondansetron  (ZOFRAN ) 4 MG tablet Take 1 tablet (4 mg total) by mouth every 8 (eight) hours as needed for nausea or vomiting. (Patient not taking: Reported on 03/14/2023) 20 tablet 0    pantoprazole  (  PROTONIX ) 40 MG tablet TAKE 1 TABLET BY MOUTH TWICE DAILY *REFILL REQUEST* (Patient taking differently: Take 40 mg by mouth See admin instructions. Take 40 mg by mouth one to two times a day 30 minutes before meals as needed for reflux) 60 tablet 10    TYLENOL  500 MG tablet Take 500-1,000 mg by mouth every 6 (six) hours as needed for mild pain or headache.       Patient Stressors: Health problems   Other: Depression     Patient Strengths: Education Administrator  Religious Affiliation  Supportive family/friends   Treatment Modalities: Medication Management, Group therapy, Case management,  1 to 1 session with clinician, Psychoeducation, Recreational therapy.   Physician Treatment Plan for Primary Diagnosis: Major depressive disorder, recurrent severe without psychotic features ([name]) Long Term Goal(s): Improvement in symptoms so as ready for discharge   Short Term Goals: Ability to identify changes in lifestyle to reduce recurrence of condition will improve Ability to verbalize feelings will improve Ability to disclose and discuss suicidal ideas Ability to demonstrate self-control will improve Ability to identify and develop effective coping behaviors will improve Ability to maintain clinical measurements within normal limits will improve Compliance with prescribed medications will improve Ability to identify triggers associated with substance abuse/mental health issues will improve  Medication Management: Evaluate patient's response, side effects, and tolerance of medication regimen.  Therapeutic Interventions: 1 to 1 sessions, Unit Group sessions and Medication  administration.  Evaluation of Outcomes: Progressing  Physician Treatment Plan for Secondary Diagnosis: Principal Problem:   Major depressive disorder, recurrent severe without psychotic features ([name])  Long Term Goal(s): Improvement in symptoms so as ready for discharge   Short Term Goals: Ability to identify changes in lifestyle to reduce recurrence of condition will improve Ability to verbalize feelings will improve Ability to disclose and discuss suicidal ideas Ability to demonstrate self-control will improve Ability to identify and develop effective coping behaviors will improve Ability to maintain clinical measurements within normal limits will improve Compliance with prescribed medications will improve Ability to identify triggers associated with substance abuse/mental health issues will improve     Medication Management: Evaluate patient's response, side effects, and tolerance of medication regimen.  Therapeutic Interventions: 1 to 1 sessions, Unit Group sessions and Medication administration.  Evaluation of Outcomes: Progressing   RN Treatment Plan for Primary Diagnosis: Major depressive disorder, recurrent severe without psychotic features ([name]) Long Term Goal(s): Knowledge of disease and therapeutic regimen to maintain health will improve  Short Term Goals: Ability to remain free from injury will improve, Ability to verbalize frustration and anger appropriately will improve, Ability to demonstrate self-control, Ability to participate in decision making will improve, Ability to verbalize feelings will improve, Ability to disclose and discuss suicidal ideas, Ability to identify and develop effective coping behaviors will improve, and Compliance with prescribed medications will improve  Medication Management: RN will administer medications as ordered by provider, will assess and evaluate patient's response and provide education to patient for prescribed medication. RN will  report any adverse and/or side effects to prescribing provider.  Therapeutic Interventions: 1 on 1 counseling sessions, Psychoeducation, Medication administration, Evaluate responses to treatment, Monitor vital signs and CBGs as ordered, Perform/monitor CIWA, COWS, AIMS and Fall Risk screenings as ordered, Perform wound care treatments as ordered.  Evaluation of Outcomes: Progressing   LCSW Treatment Plan for Primary Diagnosis: Major depressive disorder, recurrent severe without psychotic features ([name]) Long Term Goal(s): Safe transition to appropriate next level of care at discharge,  Engage patient in therapeutic group addressing interpersonal concerns.  Short Term Goals: Engage patient in aftercare planning with referrals and resources, Increase social support, Increase ability to appropriately verbalize feelings, Increase emotional regulation, Facilitate acceptance of mental health diagnosis and concerns, Facilitate patient progression through stages of change regarding substance use diagnoses and concerns, Identify triggers associated with mental health/substance abuse issues, and Increase skills for wellness and recovery  Therapeutic Interventions: Assess for all discharge needs, 1 to 1 time with Social worker, Explore available resources and support systems, Assess for adequacy in community support network, Educate family and significant other(s) on suicide prevention, Complete Psychosocial Assessment, Interpersonal group therapy.  Evaluation of Outcomes: Progressing   Progress in Treatment: Attending groups: Yes. and No. Participating in groups: Yes. and No. Taking medication as prescribed: Yes. Toleration medication: Yes. Family/Significant other contact made: Yes, individual(s) contacted:  [name] Costa, [phone] Patient understands diagnosis: Yes. Discussing patient identified problems/goals with staff: Yes. Medical problems stabilized or resolved: Yes. Denies  suicidal/homicidal ideation: Yes. Issues/concerns per patient self-inventory: No. Other: None  New problem(s) identified: No, Describe:  None identified   New Short Term/Long Term Goal(s):elimination of symptoms of psychosis, medication management for mood stabilization; elimination of SI thoughts; development of comprehensive mental wellness plan.   Patient Goals:  Honey I don't know, I want to get better, I want to walk better so I can get around   Discharge Plan or Barriers: CSW will assist with appropriate discharge planning   Reason for Continuation of Hospitalization: Depression Medication stabilization  Estimated Length of Stay: 1 to 7 days   Last 3 [place] Suicide Severity Risk Score: Flowsheet Row Admission (Current) from 03/17/2023 in [facility] ED from 03/13/2023 in [facility] Department at [facility] ED to Hosp-Admission (Discharged) from 02/24/2023 in [facility]  C-SSRS RISK CATEGORY No Risk No Risk No Risk       Last PHQ 2/9 Scores:    12/27/2022    3:38 PM 12/03/2022    2:44 PM 08/23/2022    4:11 PM  Depression screen PHQ 2/9  Decreased Interest 0 0 0  Down, Depressed, Hopeless 2 0 0  PHQ - 2 Score 2 0 0  Altered sleeping 1  0  Tired, decreased energy 2  0  Change in appetite 0  0  Feeling bad or failure about yourself  2  0  Trouble concentrating 1  0  Moving slowly or fidgety/restless 2  0  Suicidal thoughts 1    PHQ-9 Score 11  0  Difficult doing work/chores Somewhat difficult      Scribe for Treatment Team: Lum JONETTA Croft, ISRA[place] 03/19/2023 3:18 PM

## 2023-03-19 NOTE — Group Note (Signed)
 Date:  03/19/2023 Time:  10:34 AM  Group Topic/Focus:  Goals Group:   The focus of this group is to help patients establish daily goals to achieve during treatment and discuss how the patient can incorporate goal setting into their daily lives to aide in recovery.    Participation Level:  Did Not Attend    Norleen SHAUNNA Bias 03/19/2023, 10:34 AM

## 2023-03-19 NOTE — Group Note (Signed)
 Date:  03/19/2023 Time:  9:23 PM  Group Topic/Focus:  Wellness Toolbox:   The focus of this group is to discuss various aspects of wellness, balancing those aspects and exploring ways to increase the ability to experience wellness.  Patients will create a wellness toolbox for use upon discharge.    Participation Level:  Did Not Attend  Participation Quality:   none  Affect:   none  Cognitive:   none  Insight: None  Engagement in Group:   none  Modes of Intervention:   none  Additional Comments:  none  Kerri Katz 03/19/2023, 9:23 PM

## 2023-03-20 DIAGNOSIS — F332 Major depressive disorder, recurrent severe without psychotic features: Secondary | ICD-10-CM | POA: Diagnosis not present

## 2023-03-20 LAB — GLUCOSE, CAPILLARY
Glucose-Capillary: 144 mg/dL — ABNORMAL HIGH (ref 70–99)
Glucose-Capillary: 247 mg/dL — ABNORMAL HIGH (ref 70–99)
Glucose-Capillary: 185 mg/dL — ABNORMAL HIGH (ref 70–99)
Glucose-Capillary: 136 mg/dL — ABNORMAL HIGH (ref 70–99)

## 2023-03-20 MED ORDER — ALBUTEROL SULFATE (2.5 MG/3ML) 0.083% IN NEBU: 3.0000 mL | INHALATION_SOLUTION | Freq: Four times a day (QID) | RESPIRATORY_TRACT | Status: DC | PRN

## 2023-03-20 MED ORDER — ENSURE ENLIVE PO LIQD: 237.0000 mL | Freq: Two times a day (BID) | ORAL | Status: DC

## 2023-03-20 NOTE — Progress Notes (Signed)
 Patient pleasant and cooperative.  Endorses anxiety.  Denies SI/HI and AVH.  Denies depression.  Pain rated 5/10 in head.    Complinat with scheduled medications. PRN medication given for pain. 15 min checks in place for safety.  Patient isolates to room with the exception of meals. Appropriate interaction with peers when in milieu.

## 2023-03-20 NOTE — Progress Notes (Signed)
 Brief TRH progress note Following up re: DM2 management  Glc reviewed Agree with current medication regimen - no changes to orders Will follow peripherally
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
## 2023-03-20 NOTE — BHH Counselor (Signed)
 CSW contacted [name], [phone] to confirm pt's discharge.  CSW unable to reach, left HIPAA compliant VM requesting return call.   Reynaldo Minium, MSW, [place] 03/20/2023 3:50 PM

## 2023-03-20 NOTE — Progress Notes (Signed)
   03/20/23 0645  15 Minute Checks  Location Bedroom  Visual Appearance Calm  Behavior Sleeping  Sleep (Behavioral Health Patients Only)  Calculate sleep? (Click Yes once per 24 hr at 0600 safety check) Yes  Documented sleep last 24 hours 11.75

## 2023-03-20 NOTE — Progress Notes (Signed)
   03/19/23 2200  Psych Admission Type (Psych Patients Only)  Admission Status Voluntary  Psychosocial Assessment  Patient Complaints Depression  Eye Contact Fair  Facial Expression Flat  Affect Appropriate to circumstance  Speech Logical/coherent  Interaction Assertive  Motor Activity Slow  Appearance/Hygiene Poor hygiene  Behavior Characteristics Cooperative  Mood Depressed  Thought Process  Coherency WDL  Content WDL  Delusions None reported or observed  Perception WDL  Hallucination None reported or observed  Judgment Impaired  Confusion None  Danger to Self  Current suicidal ideation? Denies   Patient alert and oriented. Pleasant and cooperative. Visible in the dayroom. Amb via rolling walker. Incont of B&B. Complete care provided. C/o H/A. Tylenol  650 mg given as ordered for pain/discomfort. Q15 min checks maintained for safety.  Denies SI, HI, AVH, and pain. Patient informed to notify staff with problems or concerns. Verbalized understanding.  Patient remains safe at this time.

## 2023-03-20 NOTE — Group Note (Signed)
 Recreation Therapy Group Note   Group Topic:Relaxation  Group Date: 03/20/2023 Start Time: 1100 End Time: 1140 Facilitators: Celestia Jeoffrey BRAVO, LRT, CTRS Location:  Dayroom  Group Description: Chair Yoga. LRT and patients discussed the benefits of yoga and how it differs from strength exercises. LRT educated patients on the mental and physical benefits of yoga and deep breathing and how it can be used as a associate professor. LRT and patients followed along to a guided yoga session on the television that focused on all parts of the body, as well as deep breathing. Pt encouraged to stop movement at any time if they feel discomfort or pain.   Goal Area(s) Addressed: Patient will practice using relaxation technique. Patient will identify a new coping skill.  Patient will follow multistep directions to reduce anxiety and stress.   Affect/Mood: N/A   Participation Level: Did not attend    Clinical Observations/Individualized Feedback: Maria Fox did not attend group.   Plan: Continue to engage patient in RT group sessions 2-3x/week.   Jeoffrey BRAVO Celestia, LRT, CTRS 03/20/2023 1:29 PM

## 2023-03-20 NOTE — Plan of Care (Signed)
  Problem: Education: Goal: Knowledge of the prescribed therapeutic regimen will improve Outcome: Progressing   Problem: Activity: Goal: Interest or engagement in leisure activities will improve Outcome: Progressing

## 2023-03-20 NOTE — Plan of Care (Signed)
  Problem: Education: Goal: Utilization of techniques to improve thought processes will improve Outcome: Progressing Goal: Knowledge of the prescribed therapeutic regimen will improve Outcome: Progressing   Problem: Activity: Goal: Interest or engagement in leisure activities will improve Outcome: Progressing Goal: Imbalance in normal sleep/wake cycle will improve Outcome: Progressing   Problem: Coping: Goal: Coping ability will improve Outcome: Progressing Goal: Will verbalize feelings Outcome: Progressing   Problem: Role Relationship: Goal: Will demonstrate positive changes in social behaviors and relationships Outcome: Progressing   Problem: Safety: Goal: Ability to disclose and discuss suicidal ideas will improve Outcome: Progressing Goal: Ability to identify and utilize support systems that promote safety will improve Outcome: Progressing

## 2023-03-20 NOTE — Group Note (Signed)
 Recreation Therapy Group Note   Group Topic:Goal Setting  Group Date: 03/20/2023 Start Time: 1500 End Time: 1600 Facilitators: Celestia Jeoffrey BRAVO, LRT, CTRS Location:  Dayroom  Group Description: Product/process Development Scientist. Patients were given many different magazines, a glue stick, markers, and a piece of cardstock paper. LRT and pts discussed the importance of having goals in life. LRT and pts discussed the difference between short-term and long-term goals, as well as what a SMART goal is. LRT encouraged pts to create a vision board, with images they picked and then cut out with safety scissors from the magazine, for themselves, that capture their short and long-term goals. LRT encouraged pts to show and explain their vision board to the group.   Goal Area(s) Addressed:  Patient will gain knowledge of short vs. long term goals.  Patient will identify goals for themselves. Patient will practice setting SMART goals. Patient will verbalize their goals to LRT and peers.   Affect/Mood: N/A   Participation Level: Did not attend    Clinical Observations/Individualized Feedback: Maria Fox did not attend group.   Plan: Continue to engage patient in RT group sessions 2-3x/week.   Jeoffrey BRAVO Celestia, LRT, CTRS 03/20/2023 5:31 PM

## 2023-03-21 ENCOUNTER — Ambulatory Visit: Payer: Self-pay

## 2023-03-21 DIAGNOSIS — F332 Major depressive disorder, recurrent severe without psychotic features: Secondary | ICD-10-CM | POA: Diagnosis not present

## 2023-03-21 LAB — GLUCOSE, CAPILLARY
Glucose-Capillary: 130 mg/dL — ABNORMAL HIGH (ref 70–99)
Glucose-Capillary: 261 mg/dL — ABNORMAL HIGH (ref 70–99)
Glucose-Capillary: 154 mg/dL — ABNORMAL HIGH (ref 70–99)
Glucose-Capillary: 153 mg/dL — ABNORMAL HIGH (ref 70–99)

## 2023-03-21 MED ORDER — INSULIN GLARGINE-YFGN 100 UNIT/ML ~~LOC~~ SOLN
22.0000 [IU] | Freq: Every day | SUBCUTANEOUS | Status: DC
  Administered 2023-03-22: 22 [IU] via SUBCUTANEOUS
  Filled 2023-03-21 (×2): qty 0.22

## 2023-03-21 NOTE — Patient Outreach (Signed)
  Care Coordination   Care coordination   Visit Note   03/21/2023 Name: Maria Fox MRN: [id number] DOB: 16-Feb-1946  Maria Fox is a 78 y.o. year old female who sees Rilla Baller, MD for primary care.      Goals Addressed             This Visit's Progress    Care coordination activity       Interventions Today    Flowsheet Row Most Recent Value  General Interventions   General Interventions Discussed/Reviewed Communication with  [facility] liaisons notified of patients admission to [facility].]              SDOH assessments and interventions completed:  No     Care Coordination Interventions:  No, not indicated   Follow up plan:  Will await update from hospital liaison regarding patients discharge disposition.     Encounter Outcome:  Patient Visit Completed   Mohan Erven RN,BSN,CCM [facility]  [facility], [facility] coordinator / Case Manager Phone: [phone]

## 2023-03-21 NOTE — Progress Notes (Signed)
 Occupational Therapy Treatment Patient Details Name: Maria Fox MRN: [id number] DOB: 1945/03/16 Today's Date: 03/21/2023   History of present illness Pt is a 78 y/o F with PMH including CABG/CAD, atrial flutter, CKD3, DM2, CVA (2017), vitligo, memory deficit. Initially presented to ER from PCP due to hyperglycemia, AMS; subsequently transferred to gero-psych for management of major depressive disorder.   OT comments  Pt greeted amb with MOD I in room, agreeable to OT tx session targeting further cognitive assessment. Pt completed SLUMS examination this date scoring 13/20. Of note, it is not within occupational therapy scope of practice to diagnose cognitive impairments, this screen indicates need for further testing. The SLUMS is a 30 point, 11 question screening questionnaire that tests orientation, memory, attention, and executive function. Pt with noted impairments in short term memory, problem solving, and executive function limiting ability to perform ADL/IADLs optimally. Pt advised to have assistance with med management and cooking/meal prep at this time.   [facility] Mental Status Examination Orientation: 2/3 (not year) Calculations: 1/3 Naming animals: 1/3 Patient named 8 animals (0 points is 0-4 animals; 1 is 5-9 animals; 2 is 10-14 animals; 3 is 15+ animals) Recall: 3/5  Attention: 0/2 Clock drawing: 0/4 (writes 11:10) Visual Processing: 2/2 Paragraph Memory: 4/8  Total: 13/30;  Given that patient has high school, 6 months of college level of education, this score falls in the dementia range.       If plan is discharge home, recommend the following:  Direct supervision/assist for medications management;Direct supervision/assist for financial management;Assistance with cooking/housework;Assist for transportation   Equipment Recommendations  None recommended by OT    Recommendations for Other Services      Precautions / Restrictions Precautions Precautions:  Fall Restrictions Weight Bearing Restrictions Per Provider Order: No       Mobility Bed Mobility Overal bed mobility: Modified Independent Bed Mobility: Sit to Supine, Supine to Sit     Supine to sit: Modified independent (Device/Increase time) Sit to supine: Modified independent (Device/Increase time)        Transfers Overall transfer level: Needs assistance Equipment used: Rolling walker (2 wheels) Transfers: Sit to/from Stand Sit to Stand: Supervision                 Balance Overall balance assessment: Needs assistance Sitting-balance support: No upper extremity supported, Feet supported Sitting balance-Leahy Scale: Good     Standing balance support: Bilateral upper extremity supported Standing balance-Leahy Scale: Fair                             ADL either performed or assessed with clinical judgement   ADL Overall ADL's : At baseline                                            Extremity/Trunk Assessment Upper Extremity Assessment Upper Extremity Assessment: Overall WFL for tasks assessed   Lower Extremity Assessment Lower Extremity Assessment: Overall WFL for tasks assessed   Cervical / Trunk Assessment Cervical / Trunk Assessment: Normal    Vision Patient Visual Report: No change from baseline     Perception     Praxis      Cognition Arousal: Alert Behavior During Therapy: WFL for tasks assessed/performed Overall Cognitive Status: No family/caregiver present to determine baseline cognitive functioning  General Comments: Pt participated in SLUMS assessment scoring 13/30        Exercises Other Exercises Other Exercises: edu re: role of OT, role of rehab    Shoulder Instructions       General Comments      Pertinent Vitals/ Pain       Pain Assessment Pain Assessment: No/denies pain  Home Living                                           Prior Functioning/Environment              Frequency  Min 1X/week        Progress Toward Goals  OT Goals(current goals can now be found in the care plan section)  Progress towards OT goals: Progressing toward goals  Acute Rehab OT Goals Time For Goal Achievement: 04/01/23  Plan      Co-evaluation                 AM-PAC OT 6 Clicks Daily Activity     Outcome Measure   Help from another person eating meals?: None Help from another person taking care of personal grooming?: None Help from another person toileting, which includes using toliet, bedpan, or urinal?: None Help from another person bathing (including washing, rinsing, drying)?: None Help from another person to put on and taking off regular upper body clothing?: None Help from another person to put on and taking off regular lower body clothing?: None 6 Click Score: 24    End of Session    OT Visit Diagnosis: Other symptoms and signs involving cognitive function   Activity Tolerance Patient tolerated treatment well   Patient Left in bed   Nurse Communication Mobility status        Time: 1011-1030 OT Time Calculation (min): 19 min  Charges: OT General Charges $OT Visit: 1 Visit OT Treatments $Cognitive Funtion inital: Initial 15 mins  Therisa Sheffield, OTD OTR/L  03/21/23, 11:11 AM

## 2023-03-21 NOTE — Group Note (Signed)
 Recreation Therapy Group Note   Group Topic:Relaxation  Group Date: 03/21/2023 Start Time: 1400 End Time: 1435 Facilitators: Celestia Jeoffrey BRAVO, LRT, CTRS Location:  Dayroom  Group Description: Meditation. LRT and patients discussed what they know about meditation and mindfulness. LRT played a Deep Breathing Meditation exercise script for patients to follow along to. LRT and patients discussed how meditation and deep breathing can be used as a coping skill post--discharge to help manage symptoms of stress.   Goal Area(s) Addressed: Patient will practice using relaxation technique. Patient will identify a new coping skill.  Patient will follow multistep directions to reduce anxiety and stress.   Affect/Mood: N/A   Participation Level: Did not attend    Clinical Observations/Individualized Feedback: Patient did not attend group.   Plan: Continue to engage patient in RT group sessions 2-3x/week.   Jeoffrey BRAVO Celestia, LRT, CTRS 03/21/2023 2:41 PM

## 2023-03-21 NOTE — Group Note (Signed)
 Date:  03/21/2023 Time:  6:23 AM  Group Topic/Focus:  Wrap-Up Group:   The focus of this group is to help patients review their daily goal of treatment and discuss progress on daily workbooks.    Participation Level:  Active  Participation Quality:  Appropriate, Attentive, Sharing, and Supportive  Affect:  Appropriate  Cognitive:  Alert and Appropriate  Insight: Appropriate, Good, and Improving  Engagement in Group:  Developing/Improving and Engaged  Modes of Intervention:  Discussion, Rapport Building, and Support  Additional Comments:     Rayshell Goecke 03/21/2023, 6:23 AM

## 2023-03-21 NOTE — BHH Counselor (Signed)
 CSW spoke with the patients partner to inform that blood sugar has been a barrier to discharge.   Partner reports that patient historically has high blood sugar 'cause she wont take that insulin .    He reports that patient also does not restrict her food or beverage intake at home.  He reports I'll buy myself a 12 pack of soda and her a 12 pack and I'll have like 9 left and she wont have any.  Sherryle Margo, MSW, LCSW 03/21/2023 12:41 PM

## 2023-03-21 NOTE — Progress Notes (Signed)
   03/21/23 0645  15 Minute Checks  Location Bedroom  Visual Appearance Calm  Behavior Sleeping  Sleep (Behavioral Health Patients Only)  Calculate sleep? (Click Yes once per 24 hr at 0600 safety check) Yes  Documented sleep last 24 hours 8.25

## 2023-03-21 NOTE — Group Note (Signed)
 Recreation Therapy Group Note   Group Topic:Emotion Expression  Group Date: 03/21/2023 Start Time: 1100 End Time: 1140 Facilitators: Celestia Jeoffrey BRAVO, LRT, CTRS Location:  Dayroom  Group Description: Picture a [employer] Services Operational Officer. Patients and LRT discuss what it means to be "at peace", what it feels like physically and mentally. Pts are given a canvas and watercolor paint to use and encouraged to draw their idea of a peaceful place. Pts and LRT discuss how they use this in their daily life post discharge. Pts are encouraged to take their canvas home with them as a reminder of their peaceful place whenever they are feeling depressed, anxious, etc.    Goal Area(s) Addressed:  Patient will identify what it means to experience a "peaceful" emotion. Patient will identify a new coping skill.  Patient will express their emotions through art. Patients will increase communication by talking with LRT and peers while in group.   Affect/Mood: Appropriate   Participation Level: Minimal    Clinical Observations/Individualized Feedback: Ashanty was present in group. Pt shared that she enjoyed painting and was going to try. 5 minutes into painting, pt shared that she forgot what she was painting and stopped. Pt did not restart, despite encouragement.    Plan: Continue to engage patient in RT group sessions 2-3x/week.   Jeoffrey BRAVO Celestia, LRT, CTRS 03/21/2023 12:26 PM

## 2023-03-21 NOTE — Progress Notes (Signed)
  [facility] Adult Case Management Discharge Plan :  Will you be returning to the same living situation after discharge:  Yes,  pt is returning home.  At discharge, do you have transportation home?: Yes,  pt's partner will provide transportation. Do you have the ability to pay for your medications: Yes,  [employer] MEDICARE / DREMA DUAL COMPLETE  Release of information consent forms completed and in the chart;  Patient's signature needed at discharge.  Patient to Follow up at:  Follow-up Information     [facility] Follow up on 03/27/2023.   Why: Your appointment is scheduled for 1/16 at 3:30 PM. You will receive a call at [phone] Contact information: [address]  [address] [place] [address] [phone]                 Next level of care provider has access to [facility] Link:no  Safety Planning and Suicide Prevention discussed: Yes,  SPE completed with the patient and patient's partner     Has patient been referred to the Quitline?: Patient does not use tobacco/nicotine  products  Patient has been referred for addiction treatment: No known substance use disorder.  [name], LCSW 03/21/2023, 10:32 AM

## 2023-03-21 NOTE — Plan of Care (Signed)
  Problem: Education: Goal: Utilization of techniques to improve thought processes will improve Outcome: Progressing Goal: Knowledge of the prescribed therapeutic regimen will improve Outcome: Progressing   Problem: Activity: Goal: Interest or engagement in leisure activities will improve Outcome: Progressing Goal: Imbalance in normal sleep/wake cycle will improve Outcome: Progressing   Problem: Coping: Goal: Coping ability will improve Outcome: Progressing Goal: Will verbalize feelings Outcome: Progressing   Problem: Safety: Goal: Ability to disclose and discuss suicidal ideas will improve Outcome: Progressing Goal: Ability to identify and utilize support systems that promote safety will improve Outcome: Progressing

## 2023-03-21 NOTE — Progress Notes (Signed)
 Patient pleasant and cooperative.  Endorses anxiety. Denies SI/HI and AVH.  Denies depression.  Pain rated 7/10 in head.    Compliant with scheduled medications.  PRN medication given for pain. 15 min checks in place for safety.  Patient isolates to room with exception of meals and occasional group.  Appropriate interaction with peers and staff.    Patient's discharge had been postponed.  Diabetes coordinator consult ordered.

## 2023-03-21 NOTE — Progress Notes (Signed)
 Brief TR[facility] progress note Following up re: DM2 management  Glc reviewed Pr Dr Victoria, pt will not be able to adhere to sliding scale once discharged.   Plan: I have increased Semglee  from 20 units daily to 22 units to start 03/23/23  Measure fasting blood sugar daily If fasting Glucose >130, Increase Semglee  by 2 units every 2 days until fasting sugars are consistently <130 then continue at that dose (so on 03/25/23 give 24 units, if Glc still higher than 130 then increase to 26 units on 03/27/23, and so on) Reduce Semglee  by 2 units if fasting glucose is <80 or if other concern for hypoglycemia  If patient able to self-titrate following the above instructions may be discharged on this regimen to follow w/ PCP Have also placed orders for diabetes educator

## 2023-03-21 NOTE — Plan of Care (Signed)
  Problem: Activity: Goal: Interest or engagement in leisure activities will improve Outcome: Progressing   Problem: Coping: Goal: Coping ability will improve Outcome: Progressing   

## 2023-03-21 NOTE — Progress Notes (Signed)
   03/20/23 2100  Psych Admission Type (Psych Patients Only)  Admission Status Voluntary  Psychosocial Assessment  Patient Complaints Anxiety  Eye Contact Fair  Facial Expression Flat  Affect Appropriate to circumstance  Speech Logical/coherent  Interaction Assertive  Motor Activity Slow  Appearance/Hygiene Poor hygiene;In scrubs  Behavior Characteristics Cooperative  Mood Pleasant  Thought Process  Coherency WDL  Content WDL  Delusions None reported or observed  Perception WDL  Hallucination None reported or observed  Judgment Impaired  Confusion None  Danger to Self  Current suicidal ideation? Denies   Pt received in bed with eyes open. RR even and unlabored. Pleasant and cooperative. Incont of B&B. Complete care provided. C/o H/A. Pain Scale 4/10. Tylenol  650 mg po given as ordered PRN for pain/discomfort. Attends group. Po med compliant. No behavior issues noted. Q 15 min checks maintained for safety. Denies SI/HI/AV/H. Pt remains safe on the unit.
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
## 2023-03-22 DIAGNOSIS — F332 Major depressive disorder, recurrent severe without psychotic features: Secondary | ICD-10-CM | POA: Diagnosis not present

## 2023-03-22 LAB — GLUCOSE, CAPILLARY
Glucose-Capillary: 218 mg/dL — ABNORMAL HIGH (ref 70–99)
Glucose-Capillary: 326 mg/dL — ABNORMAL HIGH (ref 70–99)

## 2023-03-22 NOTE — Plan of Care (Signed)
  Problem: Education: Goal: Utilization of techniques to improve thought processes will improve Outcome: Progressing Goal: Knowledge of the prescribed therapeutic regimen will improve Outcome: Progressing   Problem: Activity: Goal: Interest or engagement in leisure activities will improve Outcome: Progressing Goal: Imbalance in normal sleep/wake cycle will improve Outcome: Progressing   Problem: Coping: Goal: Coping ability will improve Outcome: Progressing Goal: Will verbalize feelings Outcome: Progressing   Problem: Health Behavior/Discharge Planning: Goal: Ability to make decisions will improve Outcome: Progressing Goal: Compliance with therapeutic regimen will improve Outcome: Progressing   Problem: Role Relationship: Goal: Will demonstrate positive changes in social behaviors and relationships Outcome: Progressing   Problem: Safety: Goal: Ability to disclose and discuss suicidal ideas will improve Outcome: Progressing Goal: Ability to identify and utilize support systems that promote safety will improve Outcome: Progressing   Problem: Self-Concept: Goal: Will verbalize positive feelings about self Outcome: Progressing Goal: Level of anxiety will decrease Outcome: Progressing   Problem: Education: Goal: Knowledge of [place] [facility] Education information/materials will improve Outcome: Progressing Goal: Emotional status will improve Outcome: Progressing Goal: Mental status will improve Outcome: Progressing Goal: Verbalization of understanding the information provided will improve Outcome: Progressing   Problem: Activity: Goal: Interest or engagement in activities will improve Outcome: Progressing Goal: Sleeping patterns will improve Outcome: Progressing   Problem: Coping: Goal: Ability to verbalize frustrations and anger appropriately will improve Outcome: Progressing Goal: Ability to demonstrate self-control will improve Outcome: Progressing    Problem: Health Behavior/Discharge Planning: Goal: Identification of resources available to assist in meeting health care needs will improve Outcome: Progressing Goal: Compliance with treatment plan for underlying cause of condition will improve Outcome: Progressing   Problem: Physical Regulation: Goal: Ability to maintain clinical measurements within normal limits will improve Outcome: Progressing   Problem: Safety: Goal: Periods of time without injury will increase Outcome: Progressing   Problem: Activity: Goal: Will identify at least one activity in which they can participate Outcome: Progressing   Problem: Coping: Goal: Ability to identify and develop effective coping behavior will improve Outcome: Progressing Goal: Ability to interact with others will improve Outcome: Progressing Goal: Demonstration of participation in decision-making regarding own care will improve Outcome: Progressing Goal: Ability to use eye contact when communicating with others will improve Outcome: Progressing   Problem: Health Behavior/Discharge Planning: Goal: Identification of resources available to assist in meeting health care needs will improve Outcome: Progressing   Problem: Self-Concept: Goal: Will verbalize positive feelings about self Outcome: Progressing

## 2023-03-22 NOTE — Progress Notes (Signed)
   03/22/23 1400  Psych Admission Type (Psych Patients Only)  Admission Status Voluntary  Psychosocial Assessment  Patient Complaints None  Eye Contact Fair  Facial Expression Flat  Affect Appropriate to circumstance  Speech Logical/coherent  Interaction Assertive  Motor Activity Slow  Appearance/Hygiene In scrubs  Behavior Characteristics Cooperative  Mood Pleasant  Thought Process  Coherency WDL  Content WDL  Delusions None reported or observed  Perception WDL  Hallucination None reported or observed  Judgment Impaired  Confusion None  Danger to Self  Current suicidal ideation? Denies  Danger to Others  Danger to Others None reported or observed

## 2023-03-22 NOTE — Plan of Care (Signed)
   Problem: Education: Goal: Utilization of techniques to improve thought processes will improve Outcome: Progressing Goal: Knowledge of the prescribed therapeutic regimen will improve Outcome: Progressing
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
## 2023-03-22 NOTE — Inpatient Diabetes Management (Signed)
 Inpatient Diabetes Program Recommendations  AACE/ADA: New Consensus Statement on Inpatient Glycemic Control (2015)  Target Ranges:  Prepandial:   less than 140 mg/dL      Peak postprandial:   less than 180 mg/dL (1-2 hours)      Critically ill patients:  140 - 180 mg/dL   Lab Results  Component Value Date   GLUCAP 218 (H) 03/22/2023   HGBA1C 9.6 (H) 02/27/2023    Review of Glycemic Control  Latest Reference Range & Units 03/21/23 11:20 03/21/23 16:18 03/21/23 19:48 03/22/23 08:00  Glucose-Capillary 70 - 99 mg/dL 738 (H) 845 (H) 846 (H) 218 (H)   Diabetes history: DM 2 Outpatient Diabetes medications:  Glucotrol  10 mg bid Lantus  40 units daily (patient not taking) Metformin  1000 mg q AM Current orders for Inpatient glycemic control:  Semglee  22 units daily Jardiance  10 mg daily Metformin  500 mg daily  Inpatient Diabetes Program Recommendations:    Referral received.  Based on home medication reconciliation, patient was supposed to be taking Lantus  prior to admit.  She was seen by DM coordinators several times during admission at [facility] in December of 2024.  At that time, it was thought that a basal dose of insulin  once daily may be easier for patient and there were concerns that the Metformin  may be causing diarrhea.  For discharge, consider Lantus  at lower dose (20 units daily) and stop metformin .  MD had also started Tradjenta  during that hospitalization.  Will follow.    Thanks  Randall Bullocks, RN, BC-ADM Inpatient Diabetes Coordinator Pager [phone]  (8a-5p)

## 2023-03-22 NOTE — Progress Notes (Signed)
 Brief TRH progress note Following up re: DM2 management  Glc reviewed Pr Dr [place], pt will not be able to adhere to sliding scale once discharged.   Assessment/Plan: Semglee  from 20 units daily to 22 units started today 01/11. Measure fasting blood sugar daily. Noted wide range of fasting Glc 130s - 200s. Pt probably needs sliding scale but would defer this for outpatient. For now, can leave Semglee  same dose at 22 units and encourage adherence to low carb diet. Agree w/ diabetes educator for discharge on Lantus  once daily, would recommend 20-22 units.   TRH will sign off. Please do not hesitate to contact again if needed for concerns of hypo/hyperglycemia or other issues

## 2023-03-22 NOTE — Group Note (Signed)
 Date:  03/22/2023 Time:  1:08 AM  Group Topic/Focus:  Goals Group:   The focus of this group is to help patients establish daily goals to achieve during treatment and discuss how the patient can incorporate goal setting into their daily lives to aide in recovery.    Participation Level:  Active  Participation Quality:  Appropriate and Attentive  Affect:  Appropriate  Cognitive:  Alert and Appropriate  Insight: Appropriate and Good  Engagement in Group:  Developing/Improving and Engaged  Modes of Intervention:  Discussion and Support  Additional Comments:     [name] 03/22/2023, 1:08 AM

## 2023-03-22 NOTE — Progress Notes (Signed)
 Patient was pleasant on approach, she was cooperative with treatment on the shift, she was visible in the milieu during the evening, she was compliant with medications, she denies SI, HI & AVH.  Patient seemed to sleep well through out the night.

## 2023-03-23 DIAGNOSIS — F332 Major depressive disorder, recurrent severe without psychotic features: Secondary | ICD-10-CM | POA: Diagnosis not present

## 2023-03-23 LAB — GLUCOSE, CAPILLARY
Glucose-Capillary: 223 mg/dL — ABNORMAL HIGH (ref 70–99)
Glucose-Capillary: 359 mg/dL — ABNORMAL HIGH (ref 70–99)
Glucose-Capillary: 380 mg/dL — ABNORMAL HIGH (ref 70–99)

## 2023-03-23 MED ORDER — INSULIN GLARGINE-YFGN 100 UNIT/ML ~~LOC~~ SOLN
5.0000 [IU] | Freq: Every day | SUBCUTANEOUS | Status: AC
  Administered 2023-03-23: 5 [IU] via SUBCUTANEOUS
  Filled 2023-03-23 (×2): qty 0.05

## 2023-03-23 MED ORDER — TRAZODONE HCL 50 MG PO TABS
50.0000 mg | ORAL_TABLET | Freq: Every day | ORAL | Status: DC
  Administered 2023-03-23 – 2023-03-25 (×3): 50 mg via ORAL
  Filled 2023-03-23 (×3): qty 1

## 2023-03-23 MED ORDER — INSULIN GLARGINE-YFGN 100 UNIT/ML ~~LOC~~ SOLN
22.0000 [IU] | Freq: Every day | SUBCUTANEOUS | Status: DC
  Administered 2023-03-23: 22 [IU] via SUBCUTANEOUS
  Filled 2023-03-23: qty 0.22

## 2023-03-23 NOTE — Progress Notes (Signed)
   03/23/23 1100  Psych Admission Type (Psych Patients Only)  Admission Status Voluntary  Psychosocial Assessment  Patient Complaints None  Eye Contact Fair  Facial Expression Flat  Affect Appropriate to circumstance  Speech Logical/coherent  Interaction Assertive  Motor Activity Slow  Appearance/Hygiene In scrubs  Behavior Characteristics Appropriate to situation  Mood Pleasant  Thought Process  Coherency WDL  Content WDL  Delusions None reported or observed  Perception WDL  Hallucination None reported or observed  Judgment Impaired  Confusion None  Danger to Self  Current suicidal ideation? Denies  Danger to Others  Danger to Others None reported or observed
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
## 2023-03-23 NOTE — Plan of Care (Signed)
  Problem: Education: Goal: Utilization of techniques to improve thought processes will improve Outcome: Progressing Goal: Knowledge of the prescribed therapeutic regimen will improve Outcome: Progressing   Problem: Activity: Goal: Interest or engagement in leisure activities will improve Outcome: Progressing Goal: Imbalance in normal sleep/wake cycle will improve Outcome: Progressing   

## 2023-03-23 NOTE — Care Management Note (Signed)
 [facility] note re: glucose management   Semglee  administered today mid-day 22 units  Uncertain at what interval the hyperglycemic values occurred >300 (pre-meal, post-prandial and how long if post-prandial)  Will order another 6 units  Semglee  for this evening = 28 units total  Tomorrow will plan for 28 units at bedtime   Consistent dosing approx q24h is ideal for insulin , at bedtime dosing also ideal for most people, and always measure FASTING Glc if nothing else, this value is used to help titrate long acting insulin .   Likely the patient would benefit from sliding scale insulin  + basal, or potentially bid dosing of a 70/30 regimen? Will discuss tomorrow w/ patient, diabetes coordinator and pharmacy. For now will continue basal only in attempt to replicate what she can most reliably administer at home.   [facility] will continue to follow for insulin  management.

## 2023-03-23 NOTE — Progress Notes (Signed)
   03/23/23 0000  Psych Admission Type (Psych Patients Only)  Admission Status Voluntary  Psychosocial Assessment  Patient Complaints None  Eye Contact Fair  Facial Expression Flat  Affect Appropriate to circumstance  Speech Logical/coherent  Interaction Assertive  Motor Activity Slow  Appearance/Hygiene In scrubs  Behavior Characteristics Cooperative;Appropriate to situation  Mood Pleasant  Thought Process  Coherency WDL  Content WDL  Delusions None reported or observed  Perception WDL  Hallucination None reported or observed  Judgment Impaired  Confusion None  Danger to Self  Current suicidal ideation? Denies  Danger to Others  Danger to Others None reported or observed

## 2023-03-23 NOTE — Plan of Care (Signed)
  Problem: Activity: Goal: Will identify at least one activity in which they can participate Outcome: Progressing   Problem: Coping: Goal: Ability to identify and develop effective coping behavior will improve Outcome: Progressing

## 2023-03-24 ENCOUNTER — Ambulatory Visit: Payer: 59 | Admitting: Nurse Practitioner

## 2023-03-24 LAB — GLUCOSE, CAPILLARY
Glucose-Capillary: 231 mg/dL — ABNORMAL HIGH (ref 70–99)
Glucose-Capillary: 413 mg/dL — ABNORMAL HIGH (ref 70–99)
Glucose-Capillary: 260 mg/dL — ABNORMAL HIGH (ref 70–99)

## 2023-03-24 MED ORDER — CARMEX CLASSIC LIP BALM EX OINT: TOPICAL_OINTMENT | CUTANEOUS | Status: DC | PRN

## 2023-03-24 MED ORDER — BLISTEX MEDICATED EX OINT
1.0000 | TOPICAL_OINTMENT | CUTANEOUS | Status: DC | PRN
  Administered 2023-03-24 (×2): 1 via TOPICAL
  Filled 2023-03-24: qty 6.3

## 2023-03-24 MED ORDER — LINAGLIPTIN 5 MG PO TABS
5.0000 mg | ORAL_TABLET | Freq: Every day | ORAL | Status: DC
  Administered 2023-03-24 – 2023-03-26 (×3): 5 mg via ORAL
  Filled 2023-03-24 (×3): qty 1

## 2023-03-24 MED ORDER — INSULIN GLARGINE-YFGN 100 UNIT/ML ~~LOC~~ SOLN
30.0000 [IU] | Freq: Every day | SUBCUTANEOUS | Status: DC
  Administered 2023-03-24 – 2023-03-25 (×2): 30 [IU] via SUBCUTANEOUS
  Filled 2023-03-24 (×3): qty 0.3

## 2023-03-24 NOTE — Inpatient Diabetes Management (Signed)
 Met w/ pt down in the [facility] unit.  RN gave me permission to speak with pt and pt chose the dining room to have conversation.  Pt stated that she remembered talking with me when she was at [facility] hospital (which is a true statement as I did meet with pt at [facility] and teach her how to use an insulin  pen).  I reviewed with pt that the MD at [facility] hospital changed her diabetes meds when she went home on 03/01/2023.  I talked with pt about how the MD wanted her to Stop the Glipizide  and Stop the Metformin  and Start Lantus  insulin  one injection per day and Take Tradjenta  5 mg daily.  Pt stated she knew the MD changed her meds but that she never got to start the new meds b/c she ended up right back at the hospital after she went home.  This is not a true statement as pt was discharged 03/01/2023 but did not go back to the hospital until 03/13/2023.  Pt then went on to tell me she is not sure she has the Insulin  at home--Then changed her statement and said she thinks she might have the insulin  at home but never took any of it.  I am not sure what to believe?  Pt gave me permission to call [name] her SO whom she lives with, however, when I called [name], no one answered and VM was full.  Pt was able to provide successful return demo of use of the Insulin  pen with minimal prompting.  However, I am concerned she may forget how to use the insulin  by the time she gets home.  Pt told me she does not have a CBG meter at home.  Unsure how long she has been without a meter at home??  This pt seems like she could get easily overwhelmed with a complicated insulin  regimen at home.  Ideally, she may need supervision while taking meds at home, but I am unsure if her SO can help her??  Recommend we continue a very simple insulin  regimen at home such as Lantus  once daily.  Pt also needs CBG meter for home.     --Will follow patient during hospitalization--  Adina Rudolpho Arrow RN, MSN, CDCES Diabetes Coordinator Inpatient Glycemic  Control Team Team Pager: [phone] (8a-5p)

## 2023-03-24 NOTE — BH IP Treatment Plan (Signed)
 Interdisciplinary Treatment and Diagnostic Plan Update  03/24/2023 Time of Session: 9:30 AM  Maria Fox MRN: [id number]  Principal Diagnosis: Major depressive disorder, recurrent severe without psychotic features ([name])  Secondary Diagnoses: Principal Problem:   Major depressive disorder, recurrent severe without psychotic features ([name]) Active Problems:   Essential hypertension   S/P CABG x 3   History of cerebrovascular accident (CVA) in adulthood   Chronic diastolic CHF (congestive heart failure) ([name])   Chronic kidney disease, stage 3a ([name])   CAD, multiple vessel s/p CABG x 3   Acute deep vein thrombosis (DVT) of left tibial vein ([name])   Recurrent UTI   Hx of atrial flutter   Non-insulin  treated type 2 diabetes mellitus ([name])   Current Medications:  Current Facility-Administered Medications  Medication Dose Route Frequency Provider Last Rate Last Admin   acetaminophen  (TYLENOL ) tablet 650 mg  650 mg Oral Q4H PRN Onuoha, Josephine C, NP   650 mg at 03/24/23 1507   albuterol  (PROVENTIL ) (2.5 MG/3ML) 0.083% nebulizer solution 3 mL  3 mL Inhalation Q6H PRN Victoria Ruts, MD       alum & mag hydroxide-simeth (MAALOX/MYLANTA) 200-200-20 MG/5ML suspension 30 mL  30 mL Oral Q4H PRN Onuoha, Josephine C, NP       apixaban  (ELIQUIS ) tablet 5 mg  5 mg Oral BID Cam Charlie Loving, DO   5 mg at 03/24/23 1026   atorvastatin  (LIPITOR) tablet 40 mg  40 mg Oral QPM Onuoha, Josephine C, NP   40 mg at 03/24/23 1704   clopidogrel  (PLAVIX ) tablet 75 mg  75 mg Oral Daily Onuoha, Josephine C, NP   75 mg at 03/24/23 1026   DULoxetine  (CYMBALTA ) DR capsule 60 mg  60 mg Oral Daily Onuoha, Josephine C, NP   60 mg at 03/24/23 1026   feeding supplement (GLUCERNA SHAKE) (GLUCERNA SHAKE) liquid 237 mL  237 mL Oral TID BM Cam Charlie Loving, DO   237 mL at 03/24/23 1316   gabapentin  (NEURONTIN ) capsule 100 mg  100 mg Oral TID Cam Charlie Loving, DO   100 mg at 03/24/23 1634   hydrocortisone   cream 1 %   Topical TID Cam Charlie Loving, DO   Given at 03/24/23 1704   insulin  glargine-yfgn (SEMGLEE ) injection 30 Units  30 Units Subcutaneous QHS Alexander, Natalie, DO       isosorbide  mononitrate (IMDUR ) 24 hr tablet 15 mg  15 mg Oral Daily Wouk, Devaughn Sayres, MD   15 mg at 03/24/23 1027   latanoprost  (XALATAN ) 0.005 % ophthalmic solution 1 drop  1 drop Both Eyes QHS Victoria Ruts, MD   1 drop at 03/23/23 2151   lip balm (BLISTEX) ointment 1 Application  1 Application Topical PRN Elesa Perkins, RPH   1 Application at 03/24/23 1709   LORazepam  (ATIVAN ) tablet 0.5 mg  0.5 mg Oral Q4H PRN Cam Charlie Loving, DO   0.5 mg at 03/23/23 1326   magnesium  hydroxide (MILK OF MAGNESIA) suspension 30 mL  30 mL Oral Daily PRN Onuoha, Josephine C, NP       metFORMIN  (GLUCOPHAGE -XR) 24 hr tablet 500 mg  500 mg Oral Q breakfast Cam Charlie Loving, DO   500 mg at 03/24/23 1026   metoprolol  succinate (TOPROL -XL) 24 hr tablet 25 mg  25 mg Oral Daily Onuoha, Josephine C, NP   25 mg at 03/24/23 1026   multivitamin with minerals tablet 1 tablet  1 tablet Oral Daily Cam Charlie Loving, DO   1 tablet at 03/24/23  1026   ondansetron  (ZOFRAN -ODT) disintegrating tablet 4 mg  4 mg Oral Q8H PRN Cam Charlie Loving, DO       pantoprazole  (PROTONIX ) EC tablet 40 mg  40 mg Oral Daily Cam Charlie Loving, DO   40 mg at 03/24/23 1026   traZODone  (DESYREL ) tablet 50 mg  50 mg Oral QHS Melvenia Madelene HERO, NP   50 mg at 03/23/23 2322   PTA Medications: Medications Prior to Admission  Medication Sig Dispense Refill Last Dose/Taking   acyclovir  ointment (ZOVIRAX ) 5 % Apply 1 Application topically every 3 (three) hours as needed (for fever blisters).      albuterol  (VENTOLIN  HFA) 108 (90 Base) MCG/ACT inhaler Inhale 1-2 puffs into the lungs every 6 (six) hours as needed for wheezing or shortness of breath.      apixaban  (ELIQUIS ) 5 MG TABS tablet Take 1 tablet (5 mg total) by mouth 2 (two) times  daily. 60 tablet 5    atorvastatin  (LIPITOR) 40 MG tablet TAKE 1 TABLET BY MOUTH EVERY EVENING 90 tablet 0    Blood Glucose Monitoring Suppl DEVI 1 each by Does not apply route in the morning, at noon, and at bedtime. May substitute to any manufacturer covered by patient's insurance. 1 each 0    clopidogrel  (PLAVIX ) 75 MG tablet TAKE 1 TABLET BY MOUTH ONCE DAILY 30 tablet 0    DULoxetine  (CYMBALTA ) 60 MG capsule TAKE 1 CAPSULE BY MOUTH ONCE DAILY 90 capsule 0    gabapentin  (NEURONTIN ) 300 MG capsule TAKE ONE CAPSULE BY MOUTH TWICE DAILY (Patient not taking: Reported on 03/14/2023) 180 capsule 1    glipiZIDE  (GLUCOTROL ) 10 MG tablet Take 10 mg by mouth 2 (two) times daily before a meal.      Glucose Blood (BLOOD GLUCOSE TEST STRIPS) STRP 1 each by In Vitro route in the morning, at noon, and at bedtime. May substitute to any manufacturer covered by patient's insurance. 100 strip 0    insulin  glargine (LANTUS ) 100 UNIT/ML Solostar Pen Inject 40 Units into the skin daily. (Patient not taking: Reported on 03/14/2023) 15 mL 11    Insulin  Pen Needle (PEN NEEDLES) 32G X 6 MM MISC Use 1 pen needle daily to inject insulin . 100 each 0    isosorbide  mononitrate (IMDUR ) 30 MG 24 hr tablet TAKE 1/2 TABLET BY MOUTH ONCE DAILY 15 tablet 0    Lancet Device MISC 1 each by Does not apply route in the morning, at noon, and at bedtime. May substitute to any manufacturer covered by patient's insurance. 1 each 0    Lancets Misc. MISC 1 each by Does not apply route in the morning, at noon, and at bedtime. May substitute to any manufacturer covered by patient's insurance. 100 each 0    latanoprost  (XALATAN ) 0.005 % ophthalmic solution Place 1 drop into both eyes at bedtime.       linagliptin  (TRADJENTA ) 5 MG TABS tablet Take 1 tablet (5 mg total) by mouth daily. (Patient not taking: Reported on 03/14/2023) 90 tablet 0    metFORMIN  (GLUCOPHAGE -XR) 500 MG 24 hr tablet TAKE 2 TABLETS BY MOUTH EVERY MORNING 180 tablet 0    metoprolol   succinate (TOPROL -XL) 25 MG 24 hr tablet TAKE 1 TABLET BY MOUTH ONCE DAILY 30 tablet 0    nitroGLYCERIN  (NITROSTAT ) 0.4 MG SL tablet Place 1 tablet (0.4 mg total) under the tongue every 5 (five) minutes as needed for chest pain. 25 tablet 10    ondansetron  (ZOFRAN ) 4 MG tablet Take 1  tablet (4 mg total) by mouth every 8 (eight) hours as needed for nausea or vomiting. (Patient not taking: Reported on 03/14/2023) 20 tablet 0    pantoprazole  (PROTONIX ) 40 MG tablet TAKE 1 TABLET BY MOUTH TWICE DAILY *REFILL REQUEST* (Patient taking differently: Take 40 mg by mouth See admin instructions. Take 40 mg by mouth one to two times a day 30 minutes before meals as needed for reflux) 60 tablet 10    TYLENOL  500 MG tablet Take 500-1,000 mg by mouth every 6 (six) hours as needed for mild pain or headache.       Patient Stressors: Health problems   Other: Depression     Patient Strengths: Education Administrator  Religious Affiliation  Supportive family/friends   Treatment Modalities: Medication Management, Group therapy, Case management,  1 to 1 session with clinician, Psychoeducation, Recreational therapy.   Physician Treatment Plan for Primary Diagnosis: Major depressive disorder, recurrent severe without psychotic features ([name]) Long Term Goal(s): Improvement in symptoms so as ready for discharge   Short Term Goals: Ability to identify changes in lifestyle to reduce recurrence of condition will improve Ability to verbalize feelings will improve Ability to disclose and discuss suicidal ideas Ability to demonstrate self-control will improve Ability to identify and develop effective coping behaviors will improve Ability to maintain clinical measurements within normal limits will improve Compliance with prescribed medications will improve Ability to identify triggers associated with substance abuse/mental health issues will improve  Medication Management: Evaluate patient's response, side  effects, and tolerance of medication regimen.  Therapeutic Interventions: 1 to 1 sessions, Unit Group sessions and Medication administration.  Evaluation of Outcomes: Progressing  Physician Treatment Plan for Secondary Diagnosis: Principal Problem:   Major depressive disorder, recurrent severe without psychotic features ([name]) Active Problems:   Essential hypertension   S/P CABG x 3   History of cerebrovascular accident (CVA) in adulthood   Chronic diastolic CHF (congestive heart failure) ([name])   Chronic kidney disease, stage 3a ([name])   CAD, multiple vessel s/p CABG x 3   Acute deep vein thrombosis (DVT) of left tibial vein ([name])   Recurrent UTI   Hx of atrial flutter   Non-insulin  treated type 2 diabetes mellitus ([name])  Long Term Goal(s): Improvement in symptoms so as ready for discharge   Short Term Goals: Ability to identify changes in lifestyle to reduce recurrence of condition will improve Ability to verbalize feelings will improve Ability to disclose and discuss suicidal ideas Ability to demonstrate self-control will improve Ability to identify and develop effective coping behaviors will improve Ability to maintain clinical measurements within normal limits will improve Compliance with prescribed medications will improve Ability to identify triggers associated with substance abuse/mental health issues will improve     Medication Management: Evaluate patient's response, side effects, and tolerance of medication regimen.  Therapeutic Interventions: 1 to 1 sessions, Unit Group sessions and Medication administration.  Evaluation of Outcomes: Progressing   RN Treatment Plan for Primary Diagnosis: Major depressive disorder, recurrent severe without psychotic features ([name]) Long Term Goal(s): Knowledge of disease and therapeutic regimen to maintain health will improve  Short Term Goals: Ability to remain free from injury will improve, Ability to verbalize frustration and anger  appropriately will improve, Ability to demonstrate self-control, Ability to participate in decision making will improve, Ability to verbalize feelings will improve, Ability to disclose and discuss suicidal ideas, Ability to identify and develop effective coping behaviors will improve, and Compliance with prescribed medications will improve  Medication  Management: RN will administer medications as ordered by provider, will assess and evaluate patient's response and provide education to patient for prescribed medication. RN will report any adverse and/or side effects to prescribing provider.  Therapeutic Interventions: 1 on 1 counseling sessions, Psychoeducation, Medication administration, Evaluate responses to treatment, Monitor vital signs and CBGs as ordered, Perform/monitor CIWA, COWS, AIMS and Fall Risk screenings as ordered, Perform wound care treatments as ordered.  Evaluation of Outcomes: Progressing   LCSW Treatment Plan for Primary Diagnosis: Major depressive disorder, recurrent severe without psychotic features ([name]) Long Term Goal(s): Safe transition to appropriate next level of care at discharge, Engage patient in therapeutic group addressing interpersonal concerns.  Short Term Goals: Engage patient in aftercare planning with referrals and resources, Increase social support, Increase ability to appropriately verbalize feelings, Increase emotional regulation, Facilitate acceptance of mental health diagnosis and concerns, Facilitate patient progression through stages of change regarding substance use diagnoses and concerns, and Identify triggers associated with mental health/substance abuse issues  Therapeutic Interventions: Assess for all discharge needs, 1 to 1 time with Social worker, Explore available resources and support systems, Assess for adequacy in community support network, Educate family and significant other(s) on suicide prevention, Complete Psychosocial Assessment, Interpersonal  group therapy.  Evaluation of Outcomes: Progressing  Progress in Treatment: Attending groups: Yes. and No. Participating in groups: Yes. and No. Taking medication as prescribed: Yes. Toleration medication: Yes. Family/Significant other contact made: Yes, individual(s) contacted:  [name] Costa, [phone] Patient understands diagnosis: Yes. Discussing patient identified problems/goals with staff: Yes. Medical problems stabilized or resolved: Yes. Denies suicidal/homicidal ideation: Yes. Issues/concerns per patient self-inventory: No. Other: None    New problem(s) identified: No, Describe:  None identified Update 03/24/23: No changes at this time    New Short Term/Long Term Goal(s):elimination of symptoms of psychosis, medication management for mood stabilization; elimination of SI thoughts; development of comprehensive mental wellness plan.    Patient Goals:  Maria Fox I don't know, I want to get better, I want to walk better so I can get around Update 03/24/23: No changes at this time    Discharge Plan or Barriers: CSW will assist with appropriate discharge planning Update 03/24/23: No changes at this time    Reason for Continuation of Hospitalization: Depression Medication stabilization   Estimated Length of Stay: 1 to 7 days Update 03/24/23: 03/25/23  Last 3 [place] Suicide Severity Risk Score: Flowsheet Row Admission (Current) from 03/17/2023 in [facility] from 03/13/2023 in [facility] Department at [facility] ED to Hosp-Admission (Discharged) from 02/24/2023 in [facility]  C-SSRS RISK CATEGORY No Risk No Risk No Risk       Last PHQ 2/9 Scores:    12/27/2022    3:38 PM 12/03/2022    2:44 PM 08/23/2022    4:11 PM  Depression screen PHQ 2/9  Decreased Interest 0 0 0  Down, Depressed, Hopeless 2 0 0  PHQ - 2 Score 2 0 0  Altered sleeping 1  0  Tired, decreased energy 2  0  Change in  appetite 0  0  Feeling bad or failure about yourself  2  0  Trouble concentrating 1  0  Moving slowly or fidgety/restless 2  0  Suicidal thoughts 1    PHQ-9 Score 11  0  Difficult doing work/chores Somewhat difficult      Scribe for Treatment Team: Lum JONETTA Raynaldo ISRAEL 03/24/2023 6:51 PM

## 2023-03-24 NOTE — Inpatient Diabetes Management (Addendum)
 Inpatient Diabetes Program Recommendations  AACE/ADA: New Consensus Statement on Inpatient Glycemic Control (2015)  Target Ranges:  Prepandial:   less than 140 mg/dL      Peak postprandial:   less than 180 mg/dL (1-2 hours)      Critically ill patients:  140 - 180 mg/dL    Latest Reference Range & Units 03/22/23 08:00 03/22/23 12:14  Glucose-Capillary 70 - 99 mg/dL 781 (H)  22 units Semglee  @0902  326 (H)  (H): Data is abnormally high  Latest Reference Range & Units 03/23/23 07:47 03/23/23 12:09 03/23/23 21:39  Glucose-Capillary 70 - 99 mg/dL 776 (H) 640 (H)  22 units Semglee  @1216  380 (H)  5 units Semglee  @2232   (H): Data is abnormally high  Latest Reference Range & Units 03/24/23 07:40  Glucose-Capillary 70 - 99 mg/dL 768 (H)  (H): Data is abnormally high   Admit with: Major depressive disorder, recurrent severe without psychotic features   History: DM2  Home DM Meds: Discharged from [facility] 03/01/2023 with Orders to:  Stop Glipizide  Stop Metformin  Start Lantus  40 units Daily Start Tradjenta  5 mg Daily  Current Orders: Jardiance  10 mg daily      Metformin  500 mg daily   Referral received. Based on home medication reconciliation, patient was supposed to be taking Lantus  prior to admit. She was seen by DM coordinators several times during admission at [facility] in December of 2024. At that time, it was thought that a basal dose of insulin  once daily may be easier for patient and there were concerns that the Metformin  may be causing diarrhea.     MD- Please consider:  1. Start Semglee  30 units daily (AM CBG still >200 this AM after 27 units Semglee  yesterday)  2. Please Stop Jardiance .  Pt has history of UTI and Sepsis due to UTI.  Prior Klebsiella UTI 08/2022 and 12/2022 according to Discharge Summary from Dr. Gonfa 03/01/2023  3. Please Start Novolog  Sensitive Correction Scale/ SSI (0-9 units) TID AC + HS for in-hospital use to correct elevated CBGs    4. May consider Stopping Metformin  as well.  Metformin  was stopped in December at time of d/c b/c MD thought it may be causing pt's issues with Diarrhea  5. Restart Tradjenta  5 mg daily (was prescribed Tradjenta  at time of d/c Dec 2024)   When I met w/ pt at [facility] hospital in December, she was very nervous about taking insulin  and it was determined that keeping the regimen as simple as possible would be best course of action for pt     --Will follow patient during hospitalization--  Adina Rudolpho Arrow RN, MSN, CDCES Diabetes Coordinator Inpatient Glycemic Control Team Team Pager: [phone] (8a-5p)

## 2023-03-24 NOTE — Plan of Care (Signed)
  Problem: Education: Goal: Utilization of techniques to improve thought processes will improve Outcome: Progressing Goal: Knowledge of the prescribed therapeutic regimen will improve Outcome: Progressing   Problem: Activity: Goal: Interest or engagement in leisure activities will improve Outcome: Progressing Goal: Imbalance in normal sleep/wake cycle will improve Outcome: Progressing   Problem: Coping: Goal: Coping ability will improve Outcome: Progressing   Problem: Safety: Goal: Ability to disclose and discuss suicidal ideas will improve Outcome: Progressing Goal: Ability to identify and utilize support systems that promote safety will improve Outcome: Progressing

## 2023-03-24 NOTE — Progress Notes (Signed)
 PT Cancellation Note  Patient Details Name: Maria Fox MRN: [id number] DOB: Apr 13, 1945   Cancelled Treatment:    Reason Eval/Treat Not Completed: Medical issues which prohibited therapy; Pt's most recent BG was 413 falling outside guidelines for participation with PT services.  Will attempt to see pt at a future date/time as medically appropriate.    CHARM Glendia Bertin PT, DPT 03/24/23, 2:26 PM

## 2023-03-24 NOTE — Group Note (Signed)
 Date:  03/24/2023 Time:  9:20 PM  Group Topic/Focus:  Wrap-Up Group:   The focus of this group is to help patients review their daily goal of treatment and discuss progress on daily workbooks.    Participation Level:  Active  Participation Quality:  Appropriate  Affect:  Appropriate  Cognitive:  Appropriate  Insight: Appropriate  Engagement in Group:  Engaged  Modes of Intervention:  Discussion  Additional Comments:    Maria Fox 03/24/2023, 9:20 PM

## 2023-03-24 NOTE — Progress Notes (Signed)
 Patient's CBG reading for lunch was 413. Diabetic Coordinator, Psychiatrist, and Hospitalist made aware.

## 2023-03-24 NOTE — Group Note (Signed)
 Recreation Therapy Group Note   Group Topic:General Recreation  Group Date: 03/24/2023 Start Time: 1400 End Time: 1500 Facilitators: Celestia Jeoffrey BRAVO, LRT, CTRS Location:  Dayroom  Group Description: Bingo. LRT and patients played multiple games of Bingo with music playing in the background. LRT and pts discussed how this could be a leisure interest and the importance of doing things they enjoy post-discharge. Pts won stress balls as chief financial officer.   Goal Area(s) Addressed: Patient will identify leisure interests.  Patient will practice healthy decision making. Patient will engage in recreation activity.  Patient will increase communication.      Affect/Mood: N/A   Participation Level: Did not attend    Clinical Observations/Individualized Feedback: Patient did not attend group.   Plan: Continue to engage patient in RT group sessions 2-3x/week.   Jeoffrey BRAVO Celestia, LRT, CTRS 03/24/2023 4:56 PM
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
## 2023-03-24 NOTE — Progress Notes (Signed)
 TRH note - insulin  adjustment  Agree w/ diabetes educator recommendations  Semglee  30 units at bedtime stopped metformin  Restarted tradjenta  Anticipate hyperglycemic episodes but as long as fasting glucose remain 200s or ideally down to 150s or thereabouts, transient hyperglycemia is not acutely dangerous and agree if patient does not have the capability to use complicated dosing / self-titration would stick to simplest dosing possible

## 2023-03-24 NOTE — Progress Notes (Signed)
   03/23/23 2200  Psych Admission Type (Psych Patients Only)  Admission Status Voluntary  Psychosocial Assessment  Patient Complaints None  Eye Contact Fair  Facial Expression Flat  Affect Appropriate to circumstance  Speech Logical/coherent  Interaction Assertive  Motor Activity Slow  Appearance/Hygiene In scrubs  Behavior Characteristics Appropriate to situation  Mood Pleasant  Thought Process  Coherency WDL  Content WDL  Delusions None reported or observed  Perception WDL  Hallucination None reported or observed  Judgment Impaired  Confusion None  Danger to Self  Current suicidal ideation? Denies  Danger to Others  Danger to Others None reported or observed   Pt is alert and oriented. Blood sugar 380. Provider notified. NNO. Scheduled insulin  given as ordered. No s/s of hypo/hyperglycemia noted. C/o H/A and difficulty sleeping. PRNs given as ordered. Q 15 min checks maintained for safety. Denies SI/HI/AVH.

## 2023-03-24 NOTE — Plan of Care (Signed)
   Problem: Coping: Goal: Coping ability will improve Outcome: Progressing Goal: Will verbalize feelings Outcome: Progressing

## 2023-03-24 NOTE — Progress Notes (Signed)
   03/24/23 0600  15 Minute Checks  Location Bedroom  Visual Appearance Calm  Behavior Sleeping  Sleep (Behavioral Health Patients Only)  Calculate sleep? (Click Yes once per 24 hr at 0600 safety check) Yes  Documented sleep last 24 hours 10.75

## 2023-03-24 NOTE — Progress Notes (Signed)
   03/24/23 2000  Psych Admission Type (Psych Patients Only)  Admission Status Voluntary  Psychosocial Assessment  Patient Complaints None  Eye Contact Fair  Facial Expression Animated  Affect Appropriate to circumstance  Speech Logical/coherent  Interaction Assertive  Motor Activity Slow  Appearance/Hygiene In scrubs  Behavior Characteristics Cooperative  Mood Pleasant  Thought Process  Coherency WDL  Content WDL  Delusions None reported or observed  Perception WDL  Hallucination None reported or observed  Judgment WDL  Confusion None  Danger to Self  Current suicidal ideation? Denies  Danger to Others  Danger to Others None reported or observed   Progress note   D: Pt seen in dayroom interacting with peers. Pt denies SI, HI, AVH. Pt rates pain  8/10 as a headache. Pt has had trouble with blood sugar control. Pt is pleasant and cooperative. No other concerns noted at this time.  A: Pt provided support and encouragement. Pt given scheduled medication as prescribed. PRNs as appropriate. Q15 min checks for safety.   R: Pt safe on the unit. Will continue to monitor.

## 2023-03-24 NOTE — Progress Notes (Signed)
 Patient admitted March 17, 2023 to [facility] after complaints of worsening depression, difficulty sleeping, and anxiety.  Today patient appears to be close to discharging but because of high CBG's, it has not been deemed appropriate to allow her to leave. Patient is able to make her needs known. Meds adm without difficulty. Patient unable to attend rec group due to her resting but is otherwise visible in the milieu. Tylenol  adm at 1507 for complaints of a 3/10 headache. Upon follow up, pain decreased to a 1.  Q15 minute unit checks in place.

## 2023-03-25 ENCOUNTER — Encounter: Payer: Self-pay | Admitting: *Deleted

## 2023-03-25 LAB — GLUCOSE, CAPILLARY
Glucose-Capillary: 195 mg/dL — ABNORMAL HIGH (ref 70–99)
Glucose-Capillary: 214 mg/dL — ABNORMAL HIGH (ref 70–99)
Glucose-Capillary: 205 mg/dL — ABNORMAL HIGH (ref 70–99)
Glucose-Capillary: 228 mg/dL — ABNORMAL HIGH (ref 70–99)

## 2023-03-25 MED ORDER — LINAGLIPTIN 5 MG PO TABS: 5.0000 mg | ORAL_TABLET | Freq: Every day | ORAL | 2 refills | Status: DC

## 2023-03-25 MED ORDER — ACYCLOVIR 5 % EX OINT
TOPICAL_OINTMENT | CUTANEOUS | Status: DC
  Administered 2023-03-25 – 2023-03-26 (×2): 1 via TOPICAL
  Filled 2023-03-25: qty 1

## 2023-03-25 MED ORDER — IBUPROFEN 200 MG PO TABS
600.0000 mg | ORAL_TABLET | Freq: Three times a day (TID) | ORAL | Status: DC | PRN
  Administered 2023-03-25 (×2): 600 mg via ORAL
  Filled 2023-03-25 (×2): qty 3

## 2023-03-25 MED ORDER — INSULIN GLARGINE 100 UNIT/ML SOLOSTAR PEN: 20.0000 [IU] | PEN_INJECTOR | Freq: Every day | SUBCUTANEOUS | 11 refills | Status: DC

## 2023-03-25 MED ORDER — METFORMIN HCL ER 500 MG PO TB24
1000.0000 mg | ORAL_TABLET | Freq: Every day | ORAL | Status: DC
  Filled 2023-03-25: qty 2

## 2023-03-25 NOTE — Group Note (Signed)
 Recreation Therapy Group Note   Group Topic:Stress Management  Group Date: 03/25/2023 Start Time: 1400 End Time: 1500 Facilitators: Celestia Jeoffrey BRAVO, LRT, CTRS Location:  Dayroom  Group Description: Taboo. LRT and patients played the game Taboo. The object of the game is to have peers guess the word up at the top of the card drawn that is in bold, while being sure to not use any of the descriptive words down below it on the same card. If the person attempting to explain the word in bold uses any of the descriptive words down below, they lose their turn, and no one receives that card or point. LRT and patient's took turns being the one to describe the words while the rest of the group tried to guess what they were describing. The person with the most cards from correct guesses at the end, wins.   Goal Area(s) Addressed: Patient will identify physical symptoms of stress. Patient will identify emotional symptoms of stress. Patient will identify coping skills for stress. Patient will build frustration tolerance skills.  Patient will increase communication.   Affect/Mood: N/A   Participation Level: Did not attend    Clinical Observations/Individualized Feedback: Maria Fox did not attend group.   Plan: Continue to engage patient in RT group sessions 2-3x/week.   Jeoffrey BRAVO Celestia, LRT, CTRS 03/25/2023 5:24 PM

## 2023-03-25 NOTE — TOC CM/SW Note (Signed)
 Transition of Care [facility]) - Inpatient Brief Assessment   Patient Details  Name: ARAH ARO MRN: [id number] Date of Birth: [birth year]  [facility] CM/SW Contact:    Silvano Molt, LCSW Phone Number: 03/25/2023, 2:09 PM   Clinical Narrative:  [facility] collaborated with MD regarding pt's after care needs. Pt needs HH for RN education regarding insulin  and other medications. CSW contacted [name], sig other, to discuss [facility] provider choice; [facility] and [facility] were the preference. CSW contacted Joane with [facility], but they just lost their [facility] contract Mar 12, 2023. CSW contacted Georgia  at [place], [phone] and gave her information for [facility] referral. CSW updated MD and added Kiki Barge to secure chat to take over from [facility] perspective. TOC signing off.    Transition of Care Asessment: Insurance and Status: Insurance coverage has been reviewed Patient has primary care physician: Yes   Prior level of function:: Unknown Prior/Current Home Services: No current home services Social Drivers of Health Review: SDOH reviewed interventions complete Readmission risk has been reviewed: Yes Transition of care needs: no transition of care needs at this time

## 2023-03-25 NOTE — Group Note (Signed)
 Date:  03/25/2023 Time:  10:58 PM  Group Topic/Focus:  Wrap-Up Group:   The focus of this group is to help patients review their daily goal of treatment and discuss progress on daily workbooks.    Participation Level:  Active  Participation Quality:  Appropriate  Affect:  Appropriate  Cognitive:  Alert  Insight: Appropriate  Engagement in Group:  Developing/Improving  Modes of Intervention:  Discussion  Additional Comments:    Maria Fox 03/25/2023, 10:58 PM
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
## 2023-03-25 NOTE — Plan of Care (Signed)
 D: Pt alert and oriented. Pt reports experiencing anxiety/depression at this time. Pt reports experiencing Left headache and Left ear pain at this time, prn medications given, MD notified and aware. Pt denies experiencing any SI/HI, or AVH at this time.   A: Scheduled medications administered to pt, per MD orders. Support and encouragement provided. Frequent verbal contact made. Routine safety checks conducted q15 minutes.   R: No adverse drug reactions noted. Pt verbally contracts for safety at this time. Pt compliant with medications and treatment plan. Pt interacts well with others on the unit. Pt remains safe at this time. Plan of care ongoing.  Pt did not receive metoprolol  d/t low BP, MD notified and aware.  Problem: Self-Concept: Goal: Level of anxiety will decrease Outcome: Not Progressing   Problem: Coping: Goal: Ability to verbalize frustrations and anger appropriately will improve Outcome: Progressing

## 2023-03-25 NOTE — Progress Notes (Signed)
 PT Cancellation Note  Patient Details Name: Maria Fox MRN: [id number] DOB: 1945-08-17   Cancelled Treatment:    Reason Eval/Treat Not Completed: Patient declined to participate with PT services this date x 1 in the AM and x 1 in the PM both secondary to HA with pt reporting having received pain medications prior to each attempt.  Will attempt to see pt at a future date/time as medically appropriate.   CHARM Glendia Bertin PT, DPT 03/25/23, 2:38 PM

## 2023-03-25 NOTE — Plan of Care (Signed)
  Problem: Education: Goal: Utilization of techniques to improve thought processes will improve Outcome: Progressing   Problem: Activity: Goal: Interest or engagement in leisure activities will improve Outcome: Progressing   Problem: Coping: Goal: Will verbalize feelings Outcome: Progressing
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
## 2023-03-26 DIAGNOSIS — E119 Type 2 diabetes mellitus without complications: Secondary | ICD-10-CM

## 2023-03-26 LAB — GLUCOSE, CAPILLARY
Glucose-Capillary: 148 mg/dL — ABNORMAL HIGH (ref 70–99)
Glucose-Capillary: 148 mg/dL — ABNORMAL HIGH (ref 70–99)
Glucose-Capillary: 212 mg/dL — ABNORMAL HIGH (ref 70–99)
Glucose-Capillary: 228 mg/dL — ABNORMAL HIGH (ref 70–99)

## 2023-03-26 MED ORDER — ISOSORBIDE MONONITRATE ER 30 MG PO TB24: 15.0000 mg | ORAL_TABLET | Freq: Every day | ORAL | 0 refills | Status: DC

## 2023-03-26 MED ORDER — CLOPIDOGREL BISULFATE 75 MG PO TABS: 75.0000 mg | ORAL_TABLET | Freq: Every day | ORAL | 0 refills | Status: DC

## 2023-03-26 MED ORDER — TRAZODONE HCL 50 MG PO TABS: 50.0000 mg | ORAL_TABLET | Freq: Every day | ORAL | 0 refills | Status: DC

## 2023-03-26 MED ORDER — ALBUTEROL SULFATE (2.5 MG/3ML) 0.083% IN NEBU: 3.0000 mL | INHALATION_SOLUTION | Freq: Four times a day (QID) | RESPIRATORY_TRACT | 12 refills | Status: DC | PRN

## 2023-03-26 MED ORDER — BLOOD GLUCOSE MONITORING SUPPL DEVI
1.0000 | Freq: Three times a day (TID) | 0 refills | Status: DC
Start: 2023-03-26 — End: 2023-05-16

## 2023-03-26 MED ORDER — ACYCLOVIR 5 % EX OINT: TOPICAL_OINTMENT | CUTANEOUS | 0 refills | Status: AC

## 2023-03-26 MED ORDER — HYDROCORTISONE 1 % EX CREA: TOPICAL_CREAM | Freq: Three times a day (TID) | CUTANEOUS | 0 refills | Status: DC

## 2023-03-26 MED ORDER — METOPROLOL SUCCINATE ER 25 MG PO TB24: 25.0000 mg | ORAL_TABLET | Freq: Every day | ORAL | 0 refills | Status: DC

## 2023-03-26 MED ORDER — PANTOPRAZOLE SODIUM 40 MG PO TBEC: 40.0000 mg | DELAYED_RELEASE_TABLET | Freq: Every day | ORAL | 0 refills | Status: DC

## 2023-03-26 MED ORDER — INSULIN GLARGINE-YFGN 100 UNIT/ML ~~LOC~~ SOLN: 30.0000 [IU] | Freq: Every day | SUBCUTANEOUS | 11 refills | Status: DC

## 2023-03-26 MED ORDER — LANCET DEVICE MISC: 1.0000 | Freq: Three times a day (TID) | 0 refills | Status: AC

## 2023-03-26 MED ORDER — GLUCERNA SHAKE PO LIQD: 237.0000 mL | Freq: Three times a day (TID) | ORAL | 0 refills | Status: DC

## 2023-03-26 MED ORDER — LANCETS MISC. MISC: 1.0000 | Freq: Three times a day (TID) | 0 refills | Status: AC

## 2023-03-26 MED ORDER — ADULT MULTIVITAMIN W/MINERALS CH: 1.0000 | ORAL_TABLET | Freq: Every day | ORAL | 0 refills | Status: DC

## 2023-03-26 MED ORDER — LATANOPROST 0.005 % OP SOLN: 1.0000 [drp] | Freq: Every day | OPHTHALMIC | 12 refills | Status: AC

## 2023-03-26 MED ORDER — GABAPENTIN 100 MG PO CAPS: 100.0000 mg | ORAL_CAPSULE | Freq: Three times a day (TID) | ORAL | 0 refills | Status: DC

## 2023-03-26 MED ORDER — BLOOD GLUCOSE TEST VI STRP: 1.0000 | ORAL_STRIP | Freq: Three times a day (TID) | 0 refills | Status: AC

## 2023-03-26 MED ORDER — DULOXETINE HCL 60 MG PO CPEP: 60.0000 mg | ORAL_CAPSULE | Freq: Every day | ORAL | 0 refills | Status: DC

## 2023-03-26 NOTE — Group Note (Signed)
 Date:  03/26/2023 Time:  3:23 PM  Group Topic/Focus:  Self Care:   The focus of this group is to help patients understand the importance of self care while doing a self care crossword puzzle. Also listening to relaxing music    Participation Level:  Did Not Attend  Participation Quality:    Affect:    Cognitive:    Insight:   Engagement in Group:    Modes of Intervention:    Additional Comments:  Did not attend   Parke Jandreau T Charon Akamine 03/26/2023, 3:23 PM

## 2023-03-26 NOTE — BHH Counselor (Signed)
 CSW contacted [facility], [phone] regarding pt's discharge.   CSW unable to reach, mailbox full unable to LVM.   Derrill Flirt, MSW, [place] 03/26/2023 2:03 PM

## 2023-03-26 NOTE — Progress Notes (Signed)
 Patient ID: Maria Fox, female   DOB: 1945-07-23, 78 y.o.   MRN: [id number]  Patient was discharged from [facility] unit at approx 1625 escorted by staff. Patient denies SI/HI/AVH. Discharge packet to include printed AVS, Suicide Risk Assessment, printed prescriptions, and Transition Record reviewed with patient. Belongings returned. Suicide safety plan completed with a copy kept in chart. Patient had no belongings in the locker, only clothes/items kept in her room. Patient also states that she wears eyeglasses but has left them at home.

## 2023-03-26 NOTE — Hospital Course (Signed)
 HPI: Maria Fox is a 78 y.o. female with past medical history of PAF on Eliquis , CKD stage IIIa, chronic HFpEF, type 2 diabetes, HLD, GERD, frequent UTIs, has been at [facility] psych unit to treat anxiety and depression.  Lately her sugar has been poorly controlled, patient reported that she used to be on metformin  for glucose control and most recent A1c= 8.6 in December 2024.   This time, patient was started on Lantus  and for the last few days patient has been on Lantus  30 unit daily, as well as Tradjenta .  And metformin  discontinued due to creatinine level surge.  Patient has no complaints at this point.  Denied any hypoglycemia episode since starting on Lantus .  Significant Events: Admitted 03/17/2023 for major depressive disorder to psych service [phone] [facility] consulted for DM management
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
## 2023-03-26 NOTE — BHH Counselor (Signed)
 CSW called Maria Fox, partner, [phone]. CSW unable to reach, left HIPAA compliant VM requesting return call  CSW contacted Maria Fox at [phone]. Mailbox full, unable to leave VM.   Derrill Flirt, MSW, [place] 03/26/2023 10:58 AM

## 2023-03-26 NOTE — Care Management Important Message (Signed)
 Important Message  Patient Details  Name: Maria Fox MRN: [id number] Date of Birth: 03-19-1945   Important Message Given:  Yes - Medicare IM     Claudio Culver, LCSWA 03/26/2023, 11:55 AM

## 2023-03-26 NOTE — Progress Notes (Addendum)
   I have made amendments to pt's discharge meds and removed duplicate orders for long acting insulin . Per consult on 03-25-2023, pt will go home with 20 units lantus , not 30 units. Pt to f/u with PCP in regards to restarting metformin .  Appears orders have been written for pt to be discharged today.  No charge.  Unk Garb, DO [facility]

## 2023-03-26 NOTE — Progress Notes (Addendum)
   03/25/23 2000  Psych Admission Type (Psych Patients Only)  Admission Status Voluntary  Psychosocial Assessment  Patient Complaints Anxiety;Depression  Eye Contact Fair  Facial Expression Anxious  Affect Appropriate to circumstance  Speech Logical/coherent  Interaction Assertive  Motor Activity Slow (uses walker)  Appearance/Hygiene In scrubs  Behavior Characteristics Cooperative;Calm  Mood Pleasant;Anxious  Thought Process  Coherency WDL  Content WDL  Delusions None reported or observed  Perception WDL  Hallucination None reported or observed  Judgment WDL  Confusion None  Danger to Self  Current suicidal ideation? Denies  Danger to Others  Danger to Others None reported or observed   Progress note   D: Pt seen in dayroom. Pt denies SI, HI, AVH. Pt states that last night she was seeing flowers in her room but realized that they were not real. Pt rates pain  8/10 as an earache and a headache. Today is patient's birthday. States she has been anxious and depressed today but considers her age to be a blessing. Pt c/o straining during a bowel movement today. Pt is on a stool softener that was given this morning. Pt is pleasant and cooperative. Still worried about how she will manage to keep her blood sugar under control after discharge. No other concerns noted at this time.  A: Pt provided support and encouragement. Pt given scheduled medication as prescribed. PRNs as appropriate. Q15 min checks for safety.   R: Pt safe on the unit. Will continue to monitor.

## 2023-03-26 NOTE — Plan of Care (Signed)
  Problem: Activity: Goal: Interest or engagement in leisure activities will improve Outcome: Progressing Goal: Imbalance in normal sleep/wake cycle will improve Outcome: Progressing   Problem: Coping: Goal: Coping ability will improve Outcome: Progressing Goal: Will verbalize feelings Outcome: Progressing   Problem: Health Behavior/Discharge Planning: Goal: Ability to make decisions will improve Outcome: Progressing Goal: Compliance with therapeutic regimen will improve Outcome: Progressing   Problem: Self-Concept: Goal: Will verbalize positive feelings about self Outcome: Progressing   Problem: Education: Goal: Emotional status will improve Outcome: Progressing Goal: Mental status will improve Outcome: Progressing

## 2023-03-26 NOTE — Group Note (Signed)
 Date:  03/26/2023 Time:  11:22 AM  Group Topic/Focus:  Goals Group:   The focus of this group is to help patients establish daily goals to achieve during treatment and discuss how the patient can incorporate goal setting into their daily lives to aide in recovery.    Participation Level:  Did Not Attend   Dow Gemma 03/26/2023, 11:22 AM

## 2023-03-26 NOTE — BHH Suicide Risk Assessment (Signed)
 [facility] Discharge Suicide Risk Assessment   Principal Problem: Major depressive disorder, recurrent severe without psychotic features ([name]) Discharge Diagnoses: Principal Problem:   Major depressive disorder, recurrent severe without psychotic features ([name]) Active Problems:   Essential hypertension   S/P CABG x 3   History of cerebrovascular accident (CVA) in adulthood   Chronic diastolic CHF (congestive heart failure) ([name])   Chronic kidney disease, stage 3a ([name])   CAD, multiple vessel s/p CABG x 3   Acute deep vein thrombosis (DVT) of left tibial vein ([name])   Recurrent UTI   Hx of atrial flutter   Non-insulin  treated type 2 diabetes mellitus ([name])     Musculoskeletal: Strength & Muscle Tone: within normal limits Gait & Station: normal   Psychiatric Specialty Exam  Presentation  General Appearance:  Appropriate for Environment  Eye Contact: Fair  Speech: Clear and Coherent  Speech Volume: Normal  Handedness: Right   Mood and Affect  Mood: Anxious  Duration of Depression Symptoms: No data recorded Affect: Appropriate; Congruent   Thought Process  Thought Processes: Coherent; Linear  Descriptions of Associations:Intact  Orientation:Full (Time, Place and Person)  Thought Content:Logical  History of Schizophrenia/Schizoaffective disorder:No data recorded Duration of Psychotic Symptoms:No data recorded Hallucinations:Hallucinations: None  Ideas of Reference:None  Suicidal Thoughts:Suicidal Thoughts: No  Homicidal Thoughts:Homicidal Thoughts: No   Sensorium  Memory: Remote Fair; Immediate Fair; Recent Good  Judgment: Intact  Insight: Shallow   Executive Functions  Concentration: Fair  Attention Span: Fair  Recall: Fair  Fund of Knowledge: Fair  Language: Fair   Psychomotor Activity  Psychomotor Activity: Psychomotor Activity: Normal   Assets  Assets: Communication Skills; Desire for Improvement   Sleep   Sleep: Sleep: Fair   Physical Exam: Physical Exam ROS Blood pressure (!) 153/87, pulse (!) 117, temperature (!) 97.3 F (36.3 C), resp. rate 18, height 5\' 3"  (1.6 m), weight 59.2 kg, SpO2 99%. Body mass index is 23.12 kg/m.  Mental Status Per Nursing Assessment::   On Admission:  NA  Demographic Factors:  Caucasian  Loss Factors: Decline in physical health  Historical Factors: Family history of mental illness or substance abuse  Risk Reduction Factors:   Living with another person, especially a relative, Positive social support, and Positive therapeutic relationship  Continued Clinical Symptoms:  Previous Psychiatric Diagnoses and Treatments  Cognitive Features That Contribute To Risk:  Thought constriction (tunnel vision)    Suicide Risk:  Minimal: No identifiable suicidal ideation.  Patients presenting with no risk factors but with morbid ruminations; may be classified as minimal risk based on the severity of the depressive symptoms   Follow-up Information     [facility] Follow up on 03/27/2023.   Why: Your appointment is scheduled for 1/16 at 3:30 PM. You will receive a call at [phone] Contact information: [address]  [address] [place] [address] [phone]                 Plan Of Care/Follow-up recommendations:  Activity:  AS tolerated  Aurelia Blotter, MD 03/26/2023, 10:49 AM

## 2023-03-26 NOTE — Progress Notes (Signed)
  [facility] Adult Case Management Discharge Plan :  Will you be returning to the same living situation after discharge:  Yes,  pt will return home  At discharge, do you have transportation home?: Yes,  pt's partner will pick her up  Do you have the ability to pay for your medications: Yes,   [employer]ARE / [facility] DUAL COMPLETE  Release of information consent forms completed and in the chart;  Patient's signature needed at discharge.  Patient to Follow up at:  Follow-up Information     [facility] Follow up on 03/27/2023.   Why: Your appointment is scheduled for 1/16 at 3:30 PM. You will receive a call at [phone] Contact information: [address]  [address] [place] [address] [phone]                 Next level of care provider has access to [facility] Link:no  Safety Planning and Suicide Prevention discussed: Yes,  Mee Spillers, partner, [phone]     Has patient been referred to the Quitline?: Patient does not use tobacco/nicotine  products  Patient has been referred for addiction treatment: No known substance use disorder.  [address], LCSWA 03/26/2023, 11:53 AM

## 2023-03-26 NOTE — BHH Counselor (Signed)
 CSW has attempted to contact pt's boyfriend multiple times and has been unsuccessful.   CSW contacted Non-Emergency number at [phone] to get a wellness check on pt's boyfriend.   They report they will call CSW if they are able to make contact with pt's boyfriend  Derrill Flirt, MSW, [facility] 03/26/2023 2:52 PM

## 2023-03-27 ENCOUNTER — Telehealth: Payer: Self-pay

## 2023-03-27 NOTE — Transitions of Care (Post Inpatient/ED Visit) (Signed)
   03/27/2023  Name: Maria Fox MRN: [id number] DOB: 07/28/1945  Today's TOC FU Call Status: Today's TOC FU Call Status:: Unsuccessful Call (1st Attempt) Unsuccessful Call (1st Attempt) Date: 03/27/23  Attempted to reach the patient regarding the most recent Inpatient/ED visit.  Follow Up Plan: Additional outreach attempts will be made to reach the patient to complete the Transitions of Care (Post Inpatient/ED visit) call.   Deidre Ala, RN Medical illustrator [facility] [phone]

## 2023-03-28 ENCOUNTER — Telehealth: Payer: Self-pay

## 2023-03-28 ENCOUNTER — Emergency Department (HOSPITAL_COMMUNITY)
Admission: EM | Admit: 2023-03-28 | Discharge: 2023-04-04 | Disposition: A | Discharge status: Home / Self Care | Payer: 59 | Attending: Emergency Medicine | Admitting: Emergency Medicine

## 2023-03-28 ENCOUNTER — Other Ambulatory Visit: Payer: Self-pay

## 2023-03-28 ENCOUNTER — Emergency Department (HOSPITAL_COMMUNITY): Payer: 59

## 2023-03-28 ENCOUNTER — Encounter (HOSPITAL_COMMUNITY): Payer: Self-pay

## 2023-03-28 DIAGNOSIS — R0902 Hypoxemia: Secondary | ICD-10-CM | POA: Diagnosis not present

## 2023-03-28 DIAGNOSIS — R413 Other amnesia: Secondary | ICD-10-CM | POA: Insufficient documentation

## 2023-03-28 DIAGNOSIS — N1832 Chronic kidney disease, stage 3b: Secondary | ICD-10-CM | POA: Diagnosis not present

## 2023-03-28 DIAGNOSIS — Z8616 Personal history of COVID-19: Secondary | ICD-10-CM | POA: Diagnosis not present

## 2023-03-28 DIAGNOSIS — R739 Hyperglycemia, unspecified: Secondary | ICD-10-CM | POA: Diagnosis not present

## 2023-03-28 DIAGNOSIS — F29 Unspecified psychosis not due to a substance or known physiological condition: Secondary | ICD-10-CM | POA: Insufficient documentation

## 2023-03-28 DIAGNOSIS — F419 Anxiety disorder, unspecified: Secondary | ICD-10-CM | POA: Insufficient documentation

## 2023-03-28 DIAGNOSIS — E1122 Type 2 diabetes mellitus with diabetic chronic kidney disease: Secondary | ICD-10-CM | POA: Insufficient documentation

## 2023-03-28 DIAGNOSIS — F23 Brief psychotic disorder: Secondary | ICD-10-CM | POA: Insufficient documentation

## 2023-03-28 DIAGNOSIS — I5032 Chronic diastolic (congestive) heart failure: Secondary | ICD-10-CM | POA: Insufficient documentation

## 2023-03-28 DIAGNOSIS — G4489 Other headache syndrome: Secondary | ICD-10-CM | POA: Diagnosis not present

## 2023-03-28 DIAGNOSIS — Z743 Need for continuous supervision: Secondary | ICD-10-CM | POA: Diagnosis not present

## 2023-03-28 DIAGNOSIS — I130 Hypertensive heart and chronic kidney disease with heart failure and stage 1 through stage 4 chronic kidney disease, or unspecified chronic kidney disease: Secondary | ICD-10-CM | POA: Diagnosis not present

## 2023-03-28 DIAGNOSIS — R519 Headache, unspecified: Secondary | ICD-10-CM | POA: Diagnosis not present

## 2023-03-28 DIAGNOSIS — R410 Disorientation, unspecified: Secondary | ICD-10-CM | POA: Diagnosis present

## 2023-03-28 DIAGNOSIS — Z79899 Other long term (current) drug therapy: Secondary | ICD-10-CM | POA: Diagnosis not present

## 2023-03-28 DIAGNOSIS — F411 Generalized anxiety disorder: Secondary | ICD-10-CM | POA: Insufficient documentation

## 2023-03-28 DIAGNOSIS — R6889 Other general symptoms and signs: Secondary | ICD-10-CM | POA: Diagnosis not present

## 2023-03-28 LAB — CBC WITH DIFFERENTIAL/PLATELET
Abs Immature Granulocytes: 0.05 10*3/uL (ref 0.00–0.07)
Basophils Absolute: 0.1 10*3/uL (ref 0.0–0.1)
Basophils Relative: 1 %
Eosinophils Absolute: 0.4 10*3/uL (ref 0.0–0.5)
Eosinophils Relative: 5 %
HCT: 34.1 % — ABNORMAL LOW (ref 36.0–46.0)
Hemoglobin: 10.8 g/dL — ABNORMAL LOW (ref 12.0–15.0)
Immature Granulocytes: 1 %
Lymphocytes Relative: 29 %
Lymphs Abs: 2 10*3/uL (ref 0.7–4.0)
MCH: 28.9 pg (ref 26.0–34.0)
MCHC: 31.7 g/dL (ref 30.0–36.0)
MCV: 91.2 fL (ref 80.0–100.0)
Monocytes Absolute: 0.6 10*3/uL (ref 0.1–1.0)
Monocytes Relative: 9 %
Neutro Abs: 3.8 10*3/uL (ref 1.7–7.7)
Neutrophils Relative %: 55 %
Platelets: 266 10*3/uL (ref 150–400)
RBC: 3.74 MIL/uL — ABNORMAL LOW (ref 3.87–5.11)
RDW: 13.4 % (ref 11.5–15.5)
WBC: 6.9 10*3/uL (ref 4.0–10.5)
nRBC: 0 % (ref 0.0–0.2)

## 2023-03-28 LAB — COMPREHENSIVE METABOLIC PANEL
ALT: 18 U/L (ref 0–44)
AST: 19 U/L (ref 15–41)
Albumin: 3.7 g/dL (ref 3.5–5.0)
Alkaline Phosphatase: 77 U/L (ref 38–126)
Anion gap: 10 (ref 5–15)
BUN: 23 mg/dL (ref 8–23)
CO2: 20 mmol/L — ABNORMAL LOW (ref 22–32)
Calcium: 9.3 mg/dL (ref 8.9–10.3)
Chloride: 100 mmol/L (ref 98–111)
Creatinine, Ser: 1.19 mg/dL — ABNORMAL HIGH (ref 0.44–1.00)
GFR, Estimated: 46 mL/min — ABNORMAL LOW (ref >60)
Glucose, Bld: 247 mg/dL — ABNORMAL HIGH (ref 70–99)
Potassium: 4.4 mmol/L (ref 3.5–5.1)
Sodium: 130 mmol/L — ABNORMAL LOW (ref 135–145)
Total Bilirubin: 0.5 mg/dL (ref 0.0–1.2)
Total Protein: 7.6 g/dL (ref 6.5–8.1)

## 2023-03-28 MED ORDER — GABAPENTIN 100 MG PO CAPS
100.0000 mg | ORAL_CAPSULE | Freq: Three times a day (TID) | ORAL | Status: DC
  Administered 2023-03-29 – 2023-04-04 (×21): 100 mg via ORAL
  Filled 2023-03-28 (×20): qty 1

## 2023-03-28 MED ORDER — ISOSORBIDE MONONITRATE ER 30 MG PO TB24
15.0000 mg | ORAL_TABLET | Freq: Every day | ORAL | Status: DC
  Administered 2023-03-29 – 2023-04-04 (×7): 15 mg via ORAL
  Filled 2023-03-28 (×7): qty 1

## 2023-03-28 MED ORDER — METOPROLOL SUCCINATE ER 50 MG PO TB24
25.0000 mg | ORAL_TABLET | Freq: Every day | ORAL | Status: DC
  Administered 2023-03-29 – 2023-04-04 (×7): 25 mg via ORAL
  Filled 2023-03-28 (×7): qty 1

## 2023-03-28 MED ORDER — CLOPIDOGREL BISULFATE 75 MG PO TABS
75.0000 mg | ORAL_TABLET | Freq: Every day | ORAL | Status: DC
  Administered 2023-03-29 – 2023-04-04 (×7): 75 mg via ORAL
  Filled 2023-03-28 (×7): qty 1

## 2023-03-28 MED ORDER — DULOXETINE HCL 30 MG PO CPEP
60.0000 mg | ORAL_CAPSULE | Freq: Every day | ORAL | Status: DC
  Administered 2023-03-28 – 2023-04-04 (×8): 60 mg via ORAL
  Filled 2023-03-28 (×8): qty 2

## 2023-03-28 MED ORDER — ACETAMINOPHEN 325 MG PO TABS
650.0000 mg | ORAL_TABLET | Freq: Once | ORAL | Status: AC
  Administered 2023-03-28: 650 mg via ORAL
  Filled 2023-03-28: qty 2

## 2023-03-28 MED ORDER — LINAGLIPTIN 5 MG PO TABS
5.0000 mg | ORAL_TABLET | Freq: Every day | ORAL | Status: DC
  Administered 2023-03-29 – 2023-04-04 (×7): 5 mg via ORAL
  Filled 2023-03-28 (×7): qty 1

## 2023-03-28 MED ORDER — APIXABAN 5 MG PO TABS
5.0000 mg | ORAL_TABLET | Freq: Two times a day (BID) | ORAL | Status: DC
  Administered 2023-03-29 – 2023-04-04 (×14): 5 mg via ORAL
  Filled 2023-03-28 (×14): qty 1

## 2023-03-28 MED ORDER — PANTOPRAZOLE SODIUM 40 MG PO TBEC
40.0000 mg | DELAYED_RELEASE_TABLET | Freq: Every day | ORAL | Status: DC
  Administered 2023-03-29 – 2023-04-04 (×7): 40 mg via ORAL
  Filled 2023-03-28 (×7): qty 1

## 2023-03-28 MED ORDER — ATORVASTATIN CALCIUM 40 MG PO TABS
40.0000 mg | ORAL_TABLET | Freq: Every evening | ORAL | Status: DC
  Administered 2023-03-29 – 2023-04-03 (×6): 40 mg via ORAL
  Filled 2023-03-28 (×6): qty 1

## 2023-03-28 MED ORDER — LATANOPROST 0.005 % OP SOLN
1.0000 [drp] | Freq: Every day | OPHTHALMIC | Status: DC
  Administered 2023-03-29 – 2023-04-03 (×7): 1 [drp] via OPHTHALMIC
  Filled 2023-03-28: qty 2.5

## 2023-03-28 MED ORDER — ACYCLOVIR 5 % EX OINT
TOPICAL_OINTMENT | CUTANEOUS | Status: DC
  Administered 2023-03-30 – 2023-04-01 (×6): 1 via TOPICAL
  Filled 2023-03-28 (×2): qty 15

## 2023-03-28 MED ORDER — DULOXETINE HCL 30 MG PO CPEP: 60.0000 mg | ORAL_CAPSULE | Freq: Every day | ORAL | Status: DC

## 2023-03-28 MED ORDER — TRAZODONE HCL 50 MG PO TABS
50.0000 mg | ORAL_TABLET | Freq: Every day | ORAL | Status: DC
  Administered 2023-03-29 – 2023-04-03 (×7): 50 mg via ORAL
  Filled 2023-03-28 (×7): qty 1

## 2023-03-28 MED ORDER — INSULIN GLARGINE-YFGN 100 UNIT/ML ~~LOC~~ SOLN
20.0000 [IU] | Freq: Every day | SUBCUTANEOUS | Status: DC
  Administered 2023-03-29: 20 [IU] via SUBCUTANEOUS
  Filled 2023-03-28: qty 0.2

## 2023-03-28 MED ORDER — ADULT MULTIVITAMIN W/MINERALS CH
1.0000 | ORAL_TABLET | Freq: Every day | ORAL | Status: DC
  Administered 2023-03-29 – 2023-04-04 (×7): 1 via ORAL
  Filled 2023-03-28 (×7): qty 1

## 2023-03-28 MED ORDER — LORAZEPAM 1 MG PO TABS
1.0000 mg | ORAL_TABLET | Freq: Once | ORAL | Status: AC
  Administered 2023-03-28: 1 mg via ORAL
  Filled 2023-03-28: qty 1

## 2023-03-28 NOTE — ED Notes (Signed)
Pt on bedpan to give urine sample, but also had a bowel movement, so specimen was contaminated.

## 2023-03-28 NOTE — Transitions of Care (Post Inpatient/ED Visit) (Signed)
   03/28/2023  Name: Maria Fox MRN: [id number] DOB: 27-May-1945  Today's TOC FU Call Status: Today's TOC FU Call Status:: Unsuccessful Call (2nd Attempt) Unsuccessful Call (2nd Attempt) Date: 03/28/23  Attempted to reach the patient regarding the most recent Inpatient/ED visit.  Follow Up Plan: Additional outreach attempts will be made to reach the patient to complete the Transitions of Care (Post Inpatient/ED visit) call.   Jodelle Gross RN, BSN, CCM RN Care Manager  Transitions of Care  [facility]  [phone]

## 2023-03-28 NOTE — ED Triage Notes (Signed)
Headache, visual hallucinations since yesterday. Pt states they havent had access to her meds since being d/c from [facility]. Hx of DM, Afib  152/80 BP 80 HR 18 RR 96% 2 lpm 355 cbg

## 2023-03-28 NOTE — ED Provider Notes (Signed)
[place] EMERGENCY DEPARTMENT AT [facility] Provider Note   CSN: [id number] Arrival date & time: 03/28/23  2011     History  Chief Complaint  Patient presents with   Headache   Hallucinations    Maria Fox is a 78 y.o. female.  78 year old female presents with hallucinations consisting of feeling like there is a small munchskin talking to her right ear.  Patient states that she is hearing gibberish.  Denies being told to harm himself or harm anybody else.  Patient was discharged from hospital 2 days ago after admission for severe depression.  States that she [name] was not able to get her medications filled.  She has been out of her Cymbalta as well as all of her medications.  Denies any active SI or HI.  Has had a severe headache without visual neurological features.  Denies any alcohol or drug use       Home Medications Prior to Admission medications   Medication Sig Start Date End Date Taking? Authorizing Provider  acyclovir ointment (ZOVIRAX) 5 % Apply topically every 3 (three) hours. 03/26/23 04/25/23  Verner Chol, MD  albuterol (PROVENTIL) (2.5 MG/3ML) 0.083% nebulizer solution Inhale 3 mLs into the lungs every 6 (six) hours as needed for wheezing or shortness of breath. 03/26/23   Verner Chol, MD  apixaban (ELIQUIS) 5 MG TABS tablet Take 1 tablet (5 mg total) by mouth 2 (two) times daily. 11/08/22   Quintella Reichert, MD  atorvastatin (LIPITOR) 40 MG tablet TAKE 1 TABLET BY MOUTH EVERY EVENING 03/06/23   Eustaquio Boyden, MD  Blood Glucose Monitoring Suppl DEVI 1 each by Does not apply route in the morning, at noon, and at bedtime. May substitute to any manufacturer covered by patient's insurance. 03/01/23   Almon Hercules, MD  Blood Glucose Monitoring Suppl DEVI 1 each by Does not apply route in the morning, at noon, and at bedtime. May substitute to any manufacturer covered by patient's insurance. 03/26/23   Verner Chol, MD  clopidogrel (PLAVIX) 75 MG  tablet Take 1 tablet (75 mg total) by mouth daily. 03/27/23   Verner Chol, MD  DULoxetine (CYMBALTA) 60 MG capsule Take 1 capsule (60 mg total) by mouth daily. 03/27/23   Verner Chol, MD  feeding supplement, GLUCERNA SHAKE, (GLUCERNA SHAKE) LIQD Take 237 mLs by mouth 3 (three) times daily between meals. 03/26/23   Verner Chol, MD  gabapentin (NEURONTIN) 100 MG capsule Take 1 capsule (100 mg total) by mouth 3 (three) times daily. 03/26/23   Verner Chol, MD  Glucose Blood (BLOOD GLUCOSE TEST STRIPS) STRP 1 each by In Vitro route in the morning, at noon, and at bedtime. May substitute to any manufacturer covered by patient's insurance. 03/01/23 04/03/23  Almon Hercules, MD  Glucose Blood (BLOOD GLUCOSE TEST STRIPS) STRP 1 each by In Vitro route in the morning, at noon, and at bedtime. May substitute to any manufacturer covered by patient's insurance. 03/26/23 04/25/23  Verner Chol, MD  hydrocortisone cream 1 % Apply topically 3 (three) times daily. 03/26/23   Verner Chol, MD  insulin glargine (LANTUS) 100 UNIT/ML Solostar Pen Inject 20 Units into the skin daily. 03/25/23   Emeline General, MD  Insulin Pen Needle (PEN NEEDLES) 32G X 6 MM MISC Use 1 pen needle daily to inject insulin. 03/01/23   Almon Hercules, MD  isosorbide mononitrate (IMDUR) 30 MG 24 hr tablet Take 0.5 tablets (15 mg total) by mouth daily. 03/27/23   Jadapalle,  Sree, MD  Lancet Device MISC 1 each by Does not apply route in the morning, at noon, and at bedtime. May substitute to any manufacturer covered by patient's insurance. 03/01/23 03/31/23  Almon Hercules, MD  Lancet Device MISC 1 each by Does not apply route in the morning, at noon, and at bedtime. May substitute to any manufacturer covered by patient's insurance. 03/26/23 04/25/23  Verner Chol, MD  Lancets Misc. MISC 1 each by Does not apply route in the morning, at noon, and at bedtime. May substitute to any manufacturer covered by patient's insurance. 03/01/23 03/31/23   Almon Hercules, MD  Lancets Misc. MISC 1 each by Does not apply route in the morning, at noon, and at bedtime. May substitute to any manufacturer covered by patient's insurance. 03/26/23 04/25/23  Verner Chol, MD  latanoprost (XALATAN) 0.005 % ophthalmic solution Place 1 drop into both eyes at bedtime. 03/26/23   Verner Chol, MD  linagliptin (TRADJENTA) 5 MG TABS tablet Take 1 tablet (5 mg total) by mouth daily. 03/25/23   Emeline General, MD  metoprolol succinate (TOPROL-XL) 25 MG 24 hr tablet Take 1 tablet (25 mg total) by mouth daily. 03/27/23   Verner Chol, MD  Multiple Vitamin (MULTIVITAMIN WITH MINERALS) TABS tablet Take 1 tablet by mouth daily. 03/27/23   Verner Chol, MD  pantoprazole (PROTONIX) 40 MG tablet Take 1 tablet (40 mg total) by mouth daily. 03/27/23   Verner Chol, MD  traZODone (DESYREL) 50 MG tablet Take 1 tablet (50 mg total) by mouth at bedtime. 03/26/23   Verner Chol, MD      Allergies    Bee venom, Mushroom extract complex (do not select), Penicillins, Codeine, Sulfa antibiotics, Iohexol, and Erythromycin base    Review of Systems   Review of Systems  All other systems reviewed and are negative.   Physical Exam Updated Vital Signs BP (!) 156/85   Pulse 99   Temp 97.7 F (36.5 C) (Oral)   Resp 20   SpO2 100%  Physical Exam Vitals and nursing note reviewed.  Constitutional:      General: She is not in acute distress.    Appearance: Normal appearance. She is well-developed. She is not toxic-appearing.  HENT:     Head: Normocephalic and atraumatic.  Eyes:     General: Lids are normal.     Conjunctiva/sclera: Conjunctivae normal.     Pupils: Pupils are equal, round, and reactive to light.  Neck:     Thyroid: No thyroid mass.     Trachea: No tracheal deviation.  Cardiovascular:     Rate and Rhythm: Normal rate and regular rhythm.     Heart sounds: Normal heart sounds. No murmur heard.    No gallop.  Pulmonary:     Effort: Pulmonary effort is  normal. No respiratory distress.     Breath sounds: Normal breath sounds. No stridor. No decreased breath sounds, wheezing, rhonchi or rales.  Abdominal:     General: There is no distension.     Palpations: Abdomen is soft.     Tenderness: There is no abdominal tenderness. There is no rebound.  Musculoskeletal:        General: No tenderness. Normal range of motion.     Cervical back: Normal range of motion and neck supple.  Skin:    General: Skin is warm and dry.     Findings: No abrasion or rash.  Neurological:     General: No focal deficit present.  Mental Status: She is alert and oriented to person, place, and time. Mental status is at baseline.     GCS: GCS eye subscore is 4. GCS verbal subscore is 5. GCS motor subscore is 6.     Cranial Nerves: No cranial nerve deficit.     Sensory: No sensory deficit.     Motor: Motor function is intact.  Psychiatric:        Attention and Perception: Attention normal.        Mood and Affect: Affect is labile.        Speech: Speech is tangential.        Behavior: Behavior is not combative.        Thought Content: Thought content does not include homicidal or suicidal ideation.     ED Results / Procedures / Treatments   Labs (all labs ordered are listed, but only abnormal results are displayed) Labs Reviewed  COMPREHENSIVE METABOLIC PANEL - Abnormal; Notable for the following components:      Result Value   Sodium 130 (*)    CO2 20 (*)    Glucose, Bld 247 (*)    Creatinine, Ser 1.19 (*)    GFR, Estimated 46 (*)    All other components within normal limits  CBC WITH DIFFERENTIAL/PLATELET  URINALYSIS, W/ REFLEX TO CULTURE (INFECTION SUSPECTED)    EKG None  Radiology No results found.  Procedures Procedures    Medications Ordered in ED Medications  DULoxetine (CYMBALTA) DR capsule 60 mg (60 mg Oral Given 03/28/23 2110)  LORazepam (ATIVAN) tablet 1 mg (has no administration in time range)  acetaminophen (TYLENOL) tablet 650  mg (650 mg Oral Given 03/28/23 2110)    ED Course/ Medical Decision Making/ A&P                                 Medical Decision Making Amount and/or Complexity of Data Reviewed Labs: ordered. Radiology: ordered.  Risk OTC drugs. Prescription drug management.   Given Ativan here and feels much better.  Complaining of a headache and due to being on blood thinners had a head CT which did not show any acute findings.  Patient given her Cymbalta here as well 2.  Labs are otherwise reassuring.  She is continuing to hear voices in her head.  Patient is medically clear at this time.  Will need psychiatric assessment.  BH consult placed        Final Clinical Impression(s) / ED Diagnoses Final diagnoses:  None    Rx / DC Orders ED Discharge Orders     None         Lorre Nick, MD 03/28/23 2326

## 2023-03-28 NOTE — BH Assessment (Signed)
TTS Consult will be completed by IRIS. [facility]IS Coordinator will communicate in established secure chat provider name and assessment time. Thanks

## 2023-03-29 DIAGNOSIS — Z8616 Personal history of COVID-19: Secondary | ICD-10-CM | POA: Diagnosis not present

## 2023-03-29 DIAGNOSIS — R410 Disorientation, unspecified: Secondary | ICD-10-CM | POA: Diagnosis not present

## 2023-03-29 DIAGNOSIS — R413 Other amnesia: Secondary | ICD-10-CM | POA: Diagnosis not present

## 2023-03-29 DIAGNOSIS — F411 Generalized anxiety disorder: Secondary | ICD-10-CM | POA: Diagnosis not present

## 2023-03-29 DIAGNOSIS — E1122 Type 2 diabetes mellitus with diabetic chronic kidney disease: Secondary | ICD-10-CM | POA: Diagnosis not present

## 2023-03-29 DIAGNOSIS — F23 Brief psychotic disorder: Secondary | ICD-10-CM | POA: Insufficient documentation

## 2023-03-29 DIAGNOSIS — R519 Headache, unspecified: Secondary | ICD-10-CM | POA: Diagnosis not present

## 2023-03-29 DIAGNOSIS — I130 Hypertensive heart and chronic kidney disease with heart failure and stage 1 through stage 4 chronic kidney disease, or unspecified chronic kidney disease: Secondary | ICD-10-CM | POA: Diagnosis not present

## 2023-03-29 DIAGNOSIS — I10 Essential (primary) hypertension: Secondary | ICD-10-CM | POA: Diagnosis not present

## 2023-03-29 DIAGNOSIS — I5032 Chronic diastolic (congestive) heart failure: Secondary | ICD-10-CM | POA: Diagnosis not present

## 2023-03-29 DIAGNOSIS — Z79899 Other long term (current) drug therapy: Secondary | ICD-10-CM | POA: Diagnosis not present

## 2023-03-29 DIAGNOSIS — N1832 Chronic kidney disease, stage 3b: Secondary | ICD-10-CM | POA: Diagnosis not present

## 2023-03-29 LAB — CBG MONITORING, ED
Glucose-Capillary: 364 mg/dL — ABNORMAL HIGH (ref 70–99)
Glucose-Capillary: 228 mg/dL — ABNORMAL HIGH (ref 70–99)
Glucose-Capillary: 192 mg/dL — ABNORMAL HIGH (ref 70–99)

## 2023-03-29 MED ORDER — INSULIN GLARGINE-YFGN 100 UNIT/ML ~~LOC~~ SOLN
30.0000 [IU] | Freq: Every day | SUBCUTANEOUS | Status: DC
  Administered 2023-03-30 – 2023-04-04 (×5): 30 [IU] via SUBCUTANEOUS
  Filled 2023-03-29 (×6): qty 0.3

## 2023-03-29 MED ORDER — ACETAMINOPHEN 325 MG PO TABS
650.0000 mg | ORAL_TABLET | Freq: Four times a day (QID) | ORAL | Status: DC | PRN
  Administered 2023-03-29 – 2023-04-04 (×7): 650 mg via ORAL
  Filled 2023-03-29 (×7): qty 2

## 2023-03-29 MED ORDER — INSULIN GLARGINE-YFGN 100 UNIT/ML ~~LOC~~ SOLN
10.0000 [IU] | Freq: Once | SUBCUTANEOUS | Status: AC
  Administered 2023-03-29: 10 [IU] via SUBCUTANEOUS
  Filled 2023-03-29: qty 0.1

## 2023-03-29 MED ORDER — INSULIN ASPART 100 UNIT/ML IJ SOLN
0.0000 [IU] | Freq: Three times a day (TID) | INTRAMUSCULAR | Status: DC
  Administered 2023-03-29 – 2023-03-30 (×2): 5 [IU] via SUBCUTANEOUS
  Administered 2023-03-30 (×2): 8 [IU] via SUBCUTANEOUS
  Administered 2023-03-31: 3 [IU] via SUBCUTANEOUS
  Administered 2023-03-31: 8 [IU] via SUBCUTANEOUS
  Administered 2023-03-31: 11 [IU] via SUBCUTANEOUS
  Administered 2023-04-01: 8 [IU] via SUBCUTANEOUS
  Filled 2023-03-29: qty 0.15

## 2023-03-29 MED ORDER — WHITE PETROLATUM EX OINT
TOPICAL_OINTMENT | CUTANEOUS | Status: DC | PRN
  Administered 2023-03-29: 1 via TOPICAL
  Filled 2023-03-29 (×2): qty 5

## 2023-03-29 MED ORDER — RISPERIDONE 0.5 MG PO TBDP
0.5000 mg | ORAL_TABLET | Freq: Every day | ORAL | Status: DC
  Administered 2023-03-29 – 2023-03-31 (×3): 0.5 mg via ORAL
  Filled 2023-03-29 (×3): qty 1

## 2023-03-29 MED ORDER — INSULIN ASPART 100 UNIT/ML IJ SOLN
0.0000 [IU] | Freq: Every day | INTRAMUSCULAR | Status: DC
  Administered 2023-03-31: 2 [IU] via SUBCUTANEOUS
  Filled 2023-03-29: qty 0.05

## 2023-03-29 NOTE — Progress Notes (Signed)
[facility] Consult Note  Patient Name: Maria Fox MRN: [id number] DOB: 06/05/1945 DATE OF Consult: 03/29/2023  PRIMARY PSYCHIATRIC DIAGNOSES  1.  Major depressive disorder, recurrent, severe, with psychotic features    RECOMMENDATIONS  Recommendations: Medication recommendations: Continue Cymbalta 60 mg daily, Discontinue trazodone and start Quetiapine 50 mg at bedtime for sleep and hallucinations   Non-Medication/therapeutic recommendations: Please refer patient to geriatric psychiatrist after discharge  Is inpatient psychiatric hospitalization recommended for this patient? Yes (Explain why): Patient is extremity frightened due to hallucinations and does not feel safe discharging home in this condition without further care. Follow-Up Telepsychiatry C/L services: We will sign off for now. Please re-consult our service if needed for any concerning changes in the patient's condition, discharge planning, or questions.  Thank you for involving [place] in the care of this patient. If you have any additional questions or concerns, please call [phone] and ask for me or the provider on-call.  TELEPSYCHIATRY ATTESTATION & CONSENT  As the provider for this telehealth consult, I attest that I verified the patient's identity using two separate identifiers, introduced myself to the patient, provided my credentials, disclosed my location, and performed this encounter via a HIPAA-compliant, real-time, face-to-face, two-way, interactive audio and video platform and with the full consent and agreement of the patient (or guardian as applicable.)  Patient physical location: [facility] Emergency Department at [facility] . Telehealth provider physical location: home office in state of [place].  Video start time: 12:30 am  (Central Time) Video end time: 1 am (Central Time)  IDENTIFYING DATA  Maria Fox is a 78 y.o. year-old female for whom a psychiatric consultation has been ordered by the  primary provider. The patient was identified using two separate identifiers.  CHIEF COMPLAINT/REASON FOR CONSULT  I have been without my meds and I am seeing things  HISTORY OF PRESENT ILLNESS (HPI)  The patient Is an 78 year old female, who was discharged from inpatient psychiatric hospitalization 3 days ago where she was admitted for depression.  She had recently been started on duloxetine by her primary care physician and was noticing improvement.  While hospitalized, she was also started on trazodone.  Since getting discharged, she has not had access to any of her medications.  She says they were not sent to the pharmacy or the pharmacy did not get the medications to her but she has been off of her meds.  She started having hallucinations, says she is seeing those blue thing inside of her head, it open its mouth and it looks like a scary flower, she is also getting bad headaches.  She is anxious, distressed during interview.  I offered to get her meds sent to her pharmacy but she does not feel safe leaving the hospital in this condition.  She is requesting to return to inpatient psychiatry to get the hallucinations addressed. She reports having suicidal thoughts but says she would not act on them.Marland Kitchen  PAST PSYCHIATRIC HISTORY   Otherwise as per HPI above.  PAST MEDICAL HISTORY  Past Medical History:  Diagnosis Date   Acute cholecystitis 07/07/2019   Acute deep vein thrombosis (DVT) of left tibial vein ([name]) 07/11/2019   Acute left PCA stroke ([name]) 07/13/2015   Angioedema    Atrial flutter ([name])    Autoimmune deficiency syndrome ([name])    CAD (coronary artery disease), native coronary artery 06/2014   Chronic diastolic CHF (congestive heart failure) ([name]) 08/27/2014   Chronic kidney disease, stage 3b ([name])  Closed fracture of maxilla ([name])    COVID-19 virus infection 03/2020   CVA (cerebral infarction) 07/2014   bilateral corona radiata - periCABG   Dermatitis    eval [name] 2011: eczema,  eval [name] 2011: bx negative for lichen simplex or derm herpetiformis   DM (diabetes mellitus), type 2, uncontrolled w/neurologic complication 06/02/2012   ?autonomic neuropathy, gastroparesis (06/2014)    Epidermal cyst of neck 03/25/2017   Excised by derm Diona Browner)   HCAP (healthcare-associated pneumonia) 06/2014   History of chicken pox    HLD (hyperlipidemia)    Hx of migraines    remote   Hypertension    Lobar pneumonia ([name]) 03/02/2022   Maxillary fracture ([name])    Mitral regurgitation    Multiple allergies    mold, wool, dust, feathers   NSVT (nonsustained ventricular tachycardia) ([name])    Orthostatic hypotension 07/2015   Pleural effusion, left    RBBB    S/P lens implant    left side ([name])   Tricuspid regurgitation    UTI (urinary tract infection) 06/2014   Vitiligo      HOME MEDICATIONS  Facility Ordered Medications  Medication   [COMPLETED] acetaminophen (TYLENOL) tablet 650 mg   DULoxetine (CYMBALTA) DR capsule 60 mg   [COMPLETED] LORazepam (ATIVAN) tablet 1 mg   acyclovir ointment (ZOVIRAX) 5 %   apixaban (ELIQUIS) tablet 5 mg   atorvastatin (LIPITOR) tablet 40 mg   clopidogrel (PLAVIX) tablet 75 mg   gabapentin (NEURONTIN) capsule 100 mg   isosorbide mononitrate (IMDUR) 24 hr tablet 15 mg   linagliptin (TRADJENTA) tablet 5 mg   metoprolol succinate (TOPROL-XL) 24 hr tablet 25 mg   traZODone (DESYREL) tablet 50 mg   pantoprazole (PROTONIX) EC tablet 40 mg   multivitamin with minerals tablet 1 tablet   latanoprost (XALATAN) 0.005 % ophthalmic solution 1 drop   insulin glargine-yfgn (SEMGLEE) injection 20 Units   PTA Medications  Medication Sig   apixaban (ELIQUIS) 5 MG TABS tablet Take 1 tablet (5 mg total) by mouth 2 (two) times daily.   Blood Glucose Monitoring Suppl DEVI 1 each by Does not apply route in the morning, at noon, and at bedtime. May substitute to any manufacturer covered by patient's insurance.   Glucose Blood (BLOOD GLUCOSE TEST STRIPS)  STRP 1 each by In Vitro route in the morning, at noon, and at bedtime. May substitute to any manufacturer covered by patient's insurance.   Lancet Device MISC 1 each by Does not apply route in the morning, at noon, and at bedtime. May substitute to any manufacturer covered by patient's insurance.   Lancets Misc. MISC 1 each by Does not apply route in the morning, at noon, and at bedtime. May substitute to any manufacturer covered by patient's insurance.   Insulin Pen Needle (PEN NEEDLES) 32G X 6 MM MISC Use 1 pen needle daily to inject insulin.   atorvastatin (LIPITOR) 40 MG tablet TAKE 1 TABLET BY MOUTH EVERY EVENING   linagliptin (TRADJENTA) 5 MG TABS tablet Take 1 tablet (5 mg total) by mouth daily.   insulin glargine (LANTUS) 100 UNIT/ML Solostar Pen Inject 20 Units into the skin daily.   isosorbide mononitrate (IMDUR) 30 MG 24 hr tablet Take 0.5 tablets (15 mg total) by mouth daily.   metoprolol succinate (TOPROL-XL) 25 MG 24 hr tablet Take 1 tablet (25 mg total) by mouth daily.   DULoxetine (CYMBALTA) 60 MG capsule Take 1 capsule (60 mg total) by mouth daily.   traZODone (DESYREL)  50 MG tablet Take 1 tablet (50 mg total) by mouth at bedtime.   pantoprazole (PROTONIX) 40 MG tablet Take 1 tablet (40 mg total) by mouth daily.   clopidogrel (PLAVIX) 75 MG tablet Take 1 tablet (75 mg total) by mouth daily.   gabapentin (NEURONTIN) 100 MG capsule Take 1 capsule (100 mg total) by mouth 3 (three) times daily.   feeding supplement, GLUCERNA SHAKE, (GLUCERNA SHAKE) LIQD Take 237 mLs by mouth 3 (three) times daily between meals.   Multiple Vitamin (MULTIVITAMIN WITH MINERALS) TABS tablet Take 1 tablet by mouth daily.   albuterol (PROVENTIL) (2.5 MG/3ML) 0.083% nebulizer solution Inhale 3 mLs into the lungs every 6 (six) hours as needed for wheezing or shortness of breath.   acyclovir ointment (ZOVIRAX) 5 % Apply topically every 3 (three) hours.   hydrocortisone cream 1 % Apply topically 3 (three) times  daily.   latanoprost (XALATAN) 0.005 % ophthalmic solution Place 1 drop into both eyes at bedtime.   Blood Glucose Monitoring Suppl DEVI 1 each by Does not apply route in the morning, at noon, and at bedtime. May substitute to any manufacturer covered by patient's insurance.   Glucose Blood (BLOOD GLUCOSE TEST STRIPS) STRP 1 each by In Vitro route in the morning, at noon, and at bedtime. May substitute to any manufacturer covered by patient's insurance.   Lancet Device MISC 1 each by Does not apply route in the morning, at noon, and at bedtime. May substitute to any manufacturer covered by patient's insurance.   Lancets Misc. MISC 1 each by Does not apply route in the morning, at noon, and at bedtime. May substitute to any manufacturer covered by patient's insurance.   glipiZIDE (GLUCOTROL) 10 MG tablet Take 10 mg by mouth 2 (two) times daily before a meal.     ALLERGIES  Allergies  Allergen Reactions   Bee Venom Anaphylaxis, Swelling and Other (See Comments)    "Throat swelling"   Mushroom Extract Complex (Do Not Select) Anaphylaxis   Penicillins Anaphylaxis, Hives, Swelling and Other (See Comments)    Tolerates cephalosporins including cephalexin multiple times.  Has patient had a PCN reaction causing immediate rash, facial/tongue/throat swelling, SOB or lightheadedness with hypotension: Yes Has patient had a PCN reaction causing severe rash involving mucus membranes or skin necrosis: Yes Has patient had a PCN reaction that required hospitalization Yes Has patient had a PCN reaction occurring within the last 10 years: No     Codeine Nausea And Vomiting   Sulfa Antibiotics Nausea And Vomiting   Iohexol Itching, Swelling and Other (See Comments)    Iohexol, sold under the trade name Omnipaque among others, is a contrast agent used for X-ray imaging.   Erythromycin Base Rash    SOCIAL & SUBSTANCE USE HISTORY  Social History   Socioeconomic History   Marital status: Single    Spouse  name: Not on file   Number of children: 0   Years of education: Not on file   Highest education level: Not on file  Occupational History   Occupation: mail room    Employer: [employer]   Occupation: retired  Tobacco Use   Smoking status: Never   Smokeless tobacco: Never  Vaping Use   Vaping status: Never Used  Substance and Sexual Activity   Alcohol use: No   Drug use: No   Sexual activity: Not Currently  Other Topics Concern   Not on file  Social History Narrative   Caffeine: occasional  Lives with friend, [name], 1 cat   Occupation: Environmental health practitioner, and mailer at [employer]   Edu: HS   Activity: walks daily (2-3 blocks)   Diet: good water, daily fruits/vegetables      [facility] unable to reach patient to establish care (04/2015)   Social Drivers of Health   Financial Resource Strain: Low Risk  (12/03/2022)   Overall Financial Resource Strain (CARDIA)    Difficulty of Paying Living Expenses: Not hard at all  Food Insecurity: No Food Insecurity (03/17/2023)   Hunger Vital Sign    Worried About Running Out of Food in the Last Year: Never true    Ran Out of Food in the Last Year: Never true  Transportation Needs: No Transportation Needs (03/17/2023)   PRAPARE - Administrator, Civil Service (Medical): No    Lack of Transportation (Non-Medical): No  Physical Activity: Inactive (12/03/2022)   Exercise Vital Sign    Days of Exercise per Week: 0 days    Minutes of Exercise per Session: 0 min  Stress: No Stress Concern Present (12/03/2022)   [employer] of Occupational Health - Occupational Stress Questionnaire    Feeling of Stress : Not at all  Social Connections: Moderately Integrated (03/17/2023)   Social Connection and Isolation Panel [NHANES]    Frequency of Communication with Friends and Family: More than three times a week    Frequency of Social Gatherings with Friends and Family: More than three times a week    Attends  Religious Services: More than 4 times per year    Active Member of [employer] or Organizations: No    Attends Engineer, structural: Never    Marital Status: Living with partner   Social History   Tobacco Use  Smoking Status Never  Smokeless Tobacco Never   Social History   Substance and Sexual Activity  Alcohol Use No   Social History   Substance and Sexual Activity  Drug Use No      FAMILY HISTORY  Family History  Problem Relation Age of Onset   Throat cancer Father    Diabetes type II Mother    Hypertension Mother    Hyperlipidemia Mother    Colon cancer Mother    Cervical cancer Maternal Grandmother    Hypertension Brother    Hyperlipidemia Brother    Asthma Brother    Coronary artery disease Neg Hx    Stroke Neg Hx      MENTAL STATUS EXAM (MSE)  Mental Status Exam: General Appearance: Negative  Orientation:  Full (Time, Place, and Person)  Memory:  Immediate;   Fair  Concentration:  Concentration: Good  Recall:  Fair  Attention  Fair  Eye Contact:  Good  Speech:  Clear and Coherent  Language:  Good  Volume:  Normal  Mood: anxious  Affect:  Appropriate  Thought Process:  Coherent  Thought Content:  Hallucinations: Visual  Suicidal Thoughts:  Yes.  without intent/plan  Homicidal Thoughts:  No  Judgement:  Fair  Insight:  Fair  Psychomotor Activity:  Normal  Akathisia:  No  Fund of Knowledge:  Fair    Assets:  Housing Intimacy  Cognition:  Impaired,  Mild  ADL's:  Intact  AIMS (if indicated):       VITALS  Blood pressure (!) 158/95, pulse 79, temperature 97.7 F (36.5 C), temperature source Oral, resp. rate 18, SpO2 100%.  LABS  Admission on 03/28/2023  Component Date Value Ref Range Status  WBC 03/28/2023 6.9  4.0 - 10.5 K/uL Final   RBC 03/28/2023 3.74 (L)  3.87 - 5.11 MIL/uL Final   Hemoglobin 03/28/2023 10.8 (L)  12.0 - 15.0 g/dL Final   HCT 16/12/9602 34.1 (L)  36.0 - 46.0 % Final   MCV 03/28/2023 91.2  80.0 - 100.0 fL Final    MCH 03/28/2023 28.9  26.0 - 34.0 pg Final   MCHC 03/28/2023 31.7  30.0 - 36.0 g/dL Final   RDW 54/11/8117 13.4  11.5 - 15.5 % Final   Platelets 03/28/2023 266  150 - 400 K/uL Final   nRBC 03/28/2023 0.0  0.0 - 0.2 % Final   Neutrophils Relative % 03/28/2023 55  % Final   Neutro Abs 03/28/2023 3.8  1.7 - 7.7 K/uL Final   Lymphocytes Relative 03/28/2023 29  % Final   Lymphs Abs 03/28/2023 2.0  0.7 - 4.0 K/uL Final   Monocytes Relative 03/28/2023 9  % Final   Monocytes Absolute 03/28/2023 0.6  0.1 - 1.0 K/uL Final   Eosinophils Relative 03/28/2023 5  % Final   Eosinophils Absolute 03/28/2023 0.4  0.0 - 0.5 K/uL Final   Basophils Relative 03/28/2023 1  % Final   Basophils Absolute 03/28/2023 0.1  0.0 - 0.1 K/uL Final   Immature Granulocytes 03/28/2023 1  % Final   Abs Immature Granulocytes 03/28/2023 0.05  0.00 - 0.07 K/uL Final   Performed at [facility], [address], [place], [place] [address]   Sodium 03/28/2023 130 (L)  135 - 145 mmol/L Final   Potassium 03/28/2023 4.4  3.5 - 5.1 mmol/L Final   Chloride 03/28/2023 100  98 - 111 mmol/L Final   CO2 03/28/2023 20 (L)  22 - 32 mmol/L Final   Glucose, Bld 03/28/2023 247 (H)  70 - 99 mg/dL Final   Glucose reference range applies only to samples taken after fasting for at least 8 hours.   BUN 03/28/2023 23  8 - 23 mg/dL Final   Creatinine, Ser 03/28/2023 1.19 (H)  0.44 - 1.00 mg/dL Final   Calcium 95/62/1308 9.3  8.9 - 10.3 mg/dL Final   Total Protein 65/78/4696 7.6  6.5 - 8.1 g/dL Final   Albumin 29/52/8413 3.7  3.5 - 5.0 g/dL Final   AST 24/40/1027 19  15 - 41 U/L Final   ALT 03/28/2023 18  0 - 44 U/L Final   Alkaline Phosphatase 03/28/2023 77  38 - 126 U/L Final   Total Bilirubin 03/28/2023 0.5  0.0 - 1.2 mg/dL Final   GFR, Estimated 03/28/2023 46 (L)  >60 mL/min Final   Comment: (NOTE) Calculated using the CKD-EPI Creatinine Equation (2021)    Anion gap 03/28/2023 10  5 - 15 Final   Performed at [facility], [address] [place], [place] [address]    PSYCHIATRIC REVIEW OF SYSTEMS (ROS)  ROS: Notable for the following relevant positive findings: ROS  Additional findings:      Musculoskeletal: No abnormal movements observed      Gait & Station: Laying/Sitting      Pain Screening: Present - mild to moderate       RISK FORMULATION/ASSESSMENT  Is the patient experiencing any suicidal or homicidal ideations: No  Protective factors considered for safety management: Help[ seeking   Risk factors/concerns considered for safety management:  Depression  Is there a safety management plan with the patient and treatment team to minimize risk factors and promote protective factors: Yes  Explain: medication re-initiation, inpatient admission followed by referral to outpatient services  Is crisis care placement or psychiatric hospitalization recommended: No     Based on my current evaluation and risk assessment, patient is determined at this time to be at:  Low risk  *RISK ASSESSMENT Risk assessment is a dynamic process; it is possible that this patient's condition, and risk level, may change. This should be re-evaluated and managed over time as appropriate. Please re-consult psychiatric consult services if additional assistance is needed in terms of risk assessment and management. If your team decides to discharge this patient, please advise the patient how to best access emergency psychiatric services, or to call 911, if their condition worsens or they feel unsafe in any way.   Dian Situ, MD Telepsychiatry Consult ServicesPatient ID: [name], female   DOB: 02-27-1946, 78 y.o.   MRN: [id number]

## 2023-03-29 NOTE — ED Notes (Signed)
Pt states that she cannot eat her breakfast and prefers cereal. I informed her that we do not have cereal, and so she asked for some apple juice.

## 2023-03-29 NOTE — ED Provider Notes (Addendum)
Emergency Medicine Observation Re-evaluation Note  Maria Fox is a 78 y.o. female, seen on rounds today.  Pt initially presented to the ED for complaints of Headache and Hallucinations Currently, the patient is sleeping.  Physical Exam  BP (!) 142/76 (BP Location: Right Arm)   Pulse (!) 110   Temp 98.2 F (36.8 C) (Oral)   Resp 18   SpO2 95%  Physical Exam General: sleeping Cardiac: well-perfused Lungs: no resp distress Psych: resting  ED Course / MDM  EKG:   I have reviewed the labs performed to date as well as medications administered while in observation.  Recent changes in the last 24 hours include presented to ED for c/f auditory hallucinations in s/o depression on cymbalta, felt improved after ativan. CTH NAICP. Mildly hyperglycemic. Home meds were ordered.  -Ordered glucose monitoring BID.  -Sugar noted in 300s here. D/w pharmacy who noted that [facility] had been ordered at 20U but patient was taking 30U, so increased that. Added SSI as well.  -Patient complained of headache and ordered tylenol. -Patient also complained of "blister" inside her nose. It is an area of irritation on the very tip of her left medial nare. Will order vaseline.  Plan  Current plan is for psychiatric evaluation.    Loetta Rough, MD 03/29/23 [id number]    Loetta Rough, MD 03/29/23 [phone]

## 2023-03-29 NOTE — ED Notes (Signed)
Pt asked to speak with her brother. I called him for her and handed her the phone.

## 2023-03-29 NOTE — ED Notes (Signed)
Pt states that she can use the walker to ambulate. Got her a walker and placed it in her room.

## 2023-03-29 NOTE — ED Notes (Signed)
Pt is requesting for a fever blister cream because she states that she has a blister in her nose. I looked in there with a light and I can see a yellow looking blister in her left nostril. She states that it is painful

## 2023-03-29 NOTE — ED Notes (Signed)
Pt dressed out into purple scrubs. Belongings placed in labeled bag and placed in pt belongings cabinet 1-4.
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
## 2023-03-29 NOTE — ED Notes (Signed)
Patient aware of need for urine sample ?

## 2023-03-29 NOTE — ED Notes (Signed)
Patient's belongings are in locker 30

## 2023-03-29 NOTE — ED Notes (Addendum)
The patient called her spouse and was unable to reach him. I informed her of the 3 limit phone call per day. She stated that she will wait to call again later. The pt remains calm and cooperative.

## 2023-03-30 DIAGNOSIS — R519 Headache, unspecified: Secondary | ICD-10-CM | POA: Diagnosis not present

## 2023-03-30 LAB — URINALYSIS, W/ REFLEX TO CULTURE (INFECTION SUSPECTED)
Bilirubin Urine: NEGATIVE
Glucose, UA: >=500 mg/dL — AB
Hgb urine dipstick: NEGATIVE
Ketones, ur: NEGATIVE mg/dL
Nitrite: NEGATIVE
Protein, ur: NEGATIVE mg/dL
Specific Gravity, Urine: 1.014 (ref 1.005–1.030)
WBC, UA: >50 WBC/hpf (ref 0–5)
pH: 5 (ref 5.0–8.0)

## 2023-03-30 LAB — RAPID URINE DRUG SCREEN, HOSP PERFORMED
Amphetamines: NOT DETECTED
Barbiturates: NOT DETECTED
Benzodiazepines: NOT DETECTED
Cocaine: NOT DETECTED
Opiates: NOT DETECTED
Tetrahydrocannabinol: NOT DETECTED

## 2023-03-30 LAB — CBG MONITORING, ED
Glucose-Capillary: 108 mg/dL — ABNORMAL HIGH (ref 70–99)
Glucose-Capillary: 189 mg/dL — ABNORMAL HIGH (ref 70–99)
Glucose-Capillary: 241 mg/dL — ABNORMAL HIGH (ref 70–99)
Glucose-Capillary: 256 mg/dL — ABNORMAL HIGH (ref 70–99)
Glucose-Capillary: 259 mg/dL — ABNORMAL HIGH (ref 70–99)

## 2023-03-30 MED ORDER — CARMEX CLASSIC LIP BALM EX OINT
TOPICAL_OINTMENT | Freq: Once | CUTANEOUS | Status: AC
  Filled 2023-03-30: qty 10

## 2023-03-30 MED ORDER — CEPHALEXIN 500 MG PO CAPS
500.0000 mg | ORAL_CAPSULE | Freq: Four times a day (QID) | ORAL | Status: DC
  Administered 2023-03-30 – 2023-04-04 (×20): 500 mg via ORAL
  Filled 2023-03-30 (×19): qty 1

## 2023-03-30 NOTE — ED Provider Notes (Signed)
Emergency Medicine Observation Re-evaluation Note  Maria Fox is a 78 y.o. female, seen on rounds today.  Pt initially presented to the ED for complaints of Headache and Hallucinations Currently, the patient is awaiting psychiatric disposition.  Physical Exam  BP 130/71 (BP Location: Right Arm)   Pulse 96   Temp (!) 97.5 F (36.4 C) (Oral)   Resp 18   SpO2 99%  Physical Exam General: Resting comfortably Cardiac: Blood pressure and heart rate within normal limits Lungs: Normal oxygen saturation and respiratory rate Psych: Resting  ED Course / MDM  EKG:EKG Interpretation Date/Time:  Saturday March 29 2023 16:59:04 EST Ventricular Rate:  84 PR Interval:  200 QRS Duration:  120 QT Interval:  370 QTC Calculation: 437 R Axis:   -56  Text Interpretation: remove ekg Confirmed by Zadie Rhine ([id number]) on 03/30/2023 11:38:52 AM  I have reviewed the labs performed to date as well as medications administered while in observation.  Recent changes in the last 24 hours include patient received Tylenol for headache.  She had Semglee ordered..  Sugars have continued to run in the 200s CT of head shows old stroke but no acute abnormalities  Plan  Current plan is for awaiting psychiatric disposition.    Margarita Grizzle, MD 03/30/23 [phone]

## 2023-03-30 NOTE — ED Notes (Signed)
Lab called urine culture inprogress.
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
## 2023-03-30 NOTE — ED Notes (Signed)
Pt care taken was able to ambulate to the bathroom.

## 2023-03-31 DIAGNOSIS — R519 Headache, unspecified: Secondary | ICD-10-CM | POA: Diagnosis not present

## 2023-03-31 LAB — CBG MONITORING, ED
Glucose-Capillary: 264 mg/dL — ABNORMAL HIGH (ref 70–99)
Glucose-Capillary: 314 mg/dL — ABNORMAL HIGH (ref 70–99)
Glucose-Capillary: 175 mg/dL — ABNORMAL HIGH (ref 70–99)
Glucose-Capillary: 207 mg/dL — ABNORMAL HIGH (ref 70–99)

## 2023-03-31 MED ORDER — SENNA 8.6 MG PO TABS
2.0000 | ORAL_TABLET | Freq: Every day | ORAL | Status: DC | PRN
  Administered 2023-03-31: 17.2 mg via ORAL
  Filled 2023-03-31: qty 2

## 2023-03-31 NOTE — ED Provider Notes (Signed)
Emergency Medicine Observation Re-evaluation Note  Maria Fox is a 78 y.o. female, seen on rounds today.  Pt initially presented to the ED for complaints of Headache and Hallucinations Currently, the patient is sleeping.  However when awake she is still hallucinating that there is a leprechaun that jumps up on her bed and looks at her and then sits on the table and looks at her but does not seem to be threatening her at this time  Physical Exam  BP (!) 154/70 (BP Location: Right Arm)   Pulse 97   Temp 98.2 F (36.8 C) (Oral)   Resp 20   SpO2 98%  Physical Exam General: Sleeping in no acute distress Cardiac: Regular rate Lungs: No acute respiratory distress Psych: Hallucinating  ED Course / MDM  EKG:EKG Interpretation Date/Time:  Saturday March 29 2023 16:59:04 EST Ventricular Rate:  84 PR Interval:  200 QRS Duration:  120 QT Interval:  370 QTC Calculation: 437 R Axis:   -56  Text Interpretation: remove ekg Confirmed by Zadie Rhine ([id number]) on 03/30/2023 11:38:52 AM  I have reviewed the labs performed to date as well as medications administered while in observation.  Recent changes in the last 24 hours include no interventions needed overnight.  Plan  Current plan is for inpatient geriatric psych placement.  Hopefully a bed will be available today.    Gwyneth Sprout, MD 03/31/23 [phone]
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
## 2023-03-31 NOTE — Progress Notes (Signed)
LCSW Progress Note  [id number]   Maria Fox  03/31/2023  10:43 AM  Description:   Inpatient Psychiatric Referral  Patient was recommended inpatient per Dahlia Byes NP. There are no available beds at [facility], per [facility] Rona Ravens RN. Patient was referred to the following out of network facilities:    [place] Provider Address Phone Fax  [facility] [address] [place] [address] [phone]  [facility] [address] [place] [address] [phone]  [facility] [address] [place] [address] [phone]  [facility] EFAX [address] [place], [place] [phone]  [facility] [address] [place] [address] [phone]  [facility] [address] [place], [place] [address] [phone]  [facility] [address], [place] [address] [phone]  [facility] [address], [place] [address] [phone]  [facility] [address] [place] [address] [phone]   Discharge Information    Situation ongoing, CSW to continue following and update chart as more information becomes available.      [name], MSW, LCSW  03/31/2023 10:43 AM

## 2023-04-01 DIAGNOSIS — F411 Generalized anxiety disorder: Secondary | ICD-10-CM

## 2023-04-01 DIAGNOSIS — R519 Headache, unspecified: Secondary | ICD-10-CM | POA: Diagnosis not present

## 2023-04-01 LAB — URINE CULTURE: Culture: >=100000 — AB

## 2023-04-01 LAB — CBG MONITORING, ED
Glucose-Capillary: 254 mg/dL — ABNORMAL HIGH (ref 70–99)
Glucose-Capillary: 200 mg/dL — ABNORMAL HIGH (ref 70–99)
Glucose-Capillary: 182 mg/dL — ABNORMAL HIGH (ref 70–99)
Glucose-Capillary: 175 mg/dL — ABNORMAL HIGH (ref 70–99)

## 2023-04-01 MED ORDER — RISPERIDONE 0.5 MG PO TBDP
0.5000 mg | ORAL_TABLET | Freq: Two times a day (BID) | ORAL | Status: DC
  Administered 2023-04-01 – 2023-04-02 (×2): 0.5 mg via ORAL
  Filled 2023-04-01 (×2): qty 1

## 2023-04-01 MED ORDER — INSULIN ASPART 100 UNIT/ML IJ SOLN
0.0000 [IU] | Freq: Every day | INTRAMUSCULAR | Status: DC
  Administered 2023-04-03: 4 [IU] via SUBCUTANEOUS
  Filled 2023-04-01: qty 0.05

## 2023-04-01 MED ORDER — INSULIN ASPART 100 UNIT/ML IJ SOLN
0.0000 [IU] | Freq: Three times a day (TID) | INTRAMUSCULAR | Status: DC
  Administered 2023-04-01 (×2): 4 [IU] via SUBCUTANEOUS
  Administered 2023-04-02: 11 [IU] via SUBCUTANEOUS
  Administered 2023-04-02: 7 [IU] via SUBCUTANEOUS
  Administered 2023-04-02: 20 [IU] via SUBCUTANEOUS
  Administered 2023-04-03 (×3): 11 [IU] via SUBCUTANEOUS
  Administered 2023-04-04 (×2): 7 [IU] via SUBCUTANEOUS
  Filled 2023-04-01: qty 0.2

## 2023-04-01 NOTE — Progress Notes (Signed)
LCSW Progress Note  [id number]   HENNESSEY KOTECKI  04/01/2023  1:55 AM    Inpatient Behavioral Health Placement  Pt meets inpatient criteria per [name], NP-PMHNP-BC . There are no available beds within [facility] system per [facility] Rona Ravens, RN. Referral was sent to the following facilities;   Destination  Service Provider Address Phone [facility] [address], [place] [address] [phone]  [facility] [address] [place] [address] [phone]  [facility] EFAX [address] [place], [place] [phone]  [facility] [address] [place] [address] [phone]  [facility] [address] [place], [place] [address] [phone]  [facility] [address], [place] [address] [phone]  [facility] [address] [place] [address] [phone]  [facility] [address] [place] [address] [phone]  [facility] [address] [place], [place] [address] [phone]  [facility] [address] [place] [address] [phone]  [facility] [address] [place], [place] [address] [phone]  [facility] [address] [place] [address] [phone]  [facility] [address] [place], [place] [address] [phone]  [facility] [address] [place] [address] [phone]  [facility] [address] [place] [address] [phone]  [facility] [place] [phone]   [facility] [address] [place] [address] [phone]  [facility] [address] [place] [address] [phone]  [facility] [address] [place] [address] [phone]  [facility] [address], [place] [address] [phone]  [facility] [address] [place] [address] [phone]  [facility] [address], [place] [address] [phone]  [facility] [place] [phone]  [facility] [address], [place] [address] [phone]  [facility] [address], [place] [address] [phone]  [facility] [address], [place] [address] [phone]  [facility] [phone] [place] [address] [phone]  [facility] [address], [place] [address] [phone]  [facility] [address], [place] [address] [phone]    Situation ongoing,  CSW will follow up.    Maryjean Ka, MSW, LCSWA 04/01/2023 1:55 AM

## 2023-04-01 NOTE — ED Notes (Signed)
[facility] and provider have tried to call pts significant other [name]) 8 times for collateral information. A HIPAA compliant message was left to return the call. Mr. Maria Fox has not responded to the message left and now his voicemail is not accepting any new messages.   Jacquelynn Cree, [facility]  04/01/23
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
## 2023-04-01 NOTE — Inpatient Diabetes Management (Signed)
Inpatient Diabetes Program Recommendations  AACE/ADA: New Consensus Statement on Inpatient Glycemic Control  Target Ranges:  Prepandial:   less than 140 mg/dL      Peak postprandial:   less than 180 mg/dL (1-2 hours)      Critically ill patients:  140 - 180 mg/dL    Latest Reference Range & Units 04/01/23 08:26 04/01/23 12:26  Glucose-Capillary 70 - 99 mg/dL 161 (H) 096 (H)    Latest Reference Range & Units 03/31/23 09:15 03/31/23 12:51 03/31/23 16:15 03/31/23 22:37  Glucose-Capillary 70 - 99 mg/dL 045 (H) 409 (H) 811 (H) 207 (H)   Review of Glycemic Control  Diabetes history: DM2 Outpatient Diabetes medications: Glipizide 10 mg BID, Tradjenta 5 mg daily, Fortmet 500 mg BID, Lantus 20 units daily (not taking) Current orders for Inpatient glycemic control: Semglee 30 units daily, Noovolog 0-20 units TID with meals, Novolog 0-5 units at bedtime, Tradjenta 5 mg daily  Inpatient Diabetes Program Recommendations:    Insulin: Please consider increasing Semglee to 32 units daily and adding Novolog 3 units TID with meals for meal coverage if patient eats at least 50% of meals.  Thanks, Orlando Penner, RN, MSN, CDCES Diabetes Coordinator Inpatient Diabetes Program [phone] (Team Pager from 8am to 5pm)

## 2023-04-01 NOTE — ED Provider Notes (Signed)
Emergency Medicine Observation Re-evaluation Note  Maria Fox is a 78 y.o. female, seen on rounds today.  Pt initially presented to the ED for complaints of Headache and Hallucinations Currently, the patient is sleeping.  Physical Exam  BP (!) 142/72 (BP Location: Left Arm)   Pulse 87   Temp 97.7 F (36.5 C) (Oral)   Resp 18   SpO2 98%  Physical Exam General: No acute distress Lungs: No respiratory distress Psych: Sleeping  ED Course / MDM  EKG:EKG Interpretation Date/Time:  Saturday March 29 2023 16:59:04 EST Ventricular Rate:  84 PR Interval:  200 QRS Duration:  120 QT Interval:  370 QTC Calculation: 437 R Axis:   -56  Text Interpretation: remove ekg Confirmed by Zadie Rhine ([id number]) on 03/30/2023 11:38:52 AM  I have reviewed the labs performed to date as well as medications administered while in observation.  Recent changes in the last 24 hours include none.  Plan  Current plan is for placement.    Durwin Glaze, MD 04/01/23 [id number]

## 2023-04-02 ENCOUNTER — Encounter: Payer: Self-pay | Admitting: *Deleted

## 2023-04-02 DIAGNOSIS — I130 Hypertensive heart and chronic kidney disease with heart failure and stage 1 through stage 4 chronic kidney disease, or unspecified chronic kidney disease: Secondary | ICD-10-CM | POA: Diagnosis not present

## 2023-04-02 DIAGNOSIS — I5032 Chronic diastolic (congestive) heart failure: Secondary | ICD-10-CM | POA: Diagnosis not present

## 2023-04-02 DIAGNOSIS — N1832 Chronic kidney disease, stage 3b: Secondary | ICD-10-CM | POA: Diagnosis not present

## 2023-04-02 DIAGNOSIS — R410 Disorientation, unspecified: Secondary | ICD-10-CM | POA: Diagnosis not present

## 2023-04-02 DIAGNOSIS — E1122 Type 2 diabetes mellitus with diabetic chronic kidney disease: Secondary | ICD-10-CM | POA: Diagnosis not present

## 2023-04-02 DIAGNOSIS — Z79899 Other long term (current) drug therapy: Secondary | ICD-10-CM | POA: Diagnosis not present

## 2023-04-02 DIAGNOSIS — R413 Other amnesia: Secondary | ICD-10-CM | POA: Diagnosis not present

## 2023-04-02 DIAGNOSIS — Z8616 Personal history of COVID-19: Secondary | ICD-10-CM | POA: Diagnosis not present

## 2023-04-02 LAB — CBG MONITORING, ED
Glucose-Capillary: 277 mg/dL — ABNORMAL HIGH (ref 70–99)
Glucose-Capillary: 385 mg/dL — ABNORMAL HIGH (ref 70–99)
Glucose-Capillary: 218 mg/dL — ABNORMAL HIGH (ref 70–99)

## 2023-04-02 MED ORDER — QUETIAPINE FUMARATE 25 MG PO TABS
25.0000 mg | ORAL_TABLET | Freq: Every day | ORAL | Status: DC
  Administered 2023-04-02: 25 mg via ORAL
  Filled 2023-04-02: qty 1

## 2023-04-02 MED ORDER — MELATONIN 3 MG PO TABS
3.0000 mg | ORAL_TABLET | Freq: Every day | ORAL | Status: DC
  Administered 2023-04-02 – 2023-04-03 (×2): 3 mg via ORAL
  Filled 2023-04-02 (×2): qty 1

## 2023-04-02 NOTE — ED Notes (Addendum)
Pt just finished with TeleHealth and she wanted to talk with her brother on the phone.  Giving meds and calling pts brother for her.
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
## 2023-04-02 NOTE — ED Notes (Signed)
Pt given tray after cbg checked.

## 2023-04-02 NOTE — Progress Notes (Signed)
LCSW Progress Note  [id number]   Maria Fox  04/02/2023  1:24 PM  Description:   Inpatient Psychiatric Referral  Patient was recommended inpatient per Phebe Colla NP There are no available beds at [facility], per [facility] Rona Ravens RN.Patient was referred to the following out of network facilities:    [facility] Provider Address Phone Fax  [facility] [address] [place] [address] [phone]  [facility] [address] [place] [address] [phone]  [facility] EFAX [address] [place], [place] [phone]  [facility] [address], [place] [address] [phone]  [facility] [address] [place], [place] [address] [phone]  [facility] [address], [place] [address] [phone]  [facility] [address], [place] [address] [phone]  [facility] [address] [place] [address] [phone]  [facility] [address] [place], [place] [address] [phone]  [facility] [address] [place] [address] [phone]  [facility] [address] [place], [place] [address] [phone]  [facility] [address], [place] [address] [phone]  [facility] [address] [place], [place] [address] [phone]  [facility] [address] [place] [address] [phone]  [facility] [address], [place] [address] [phone]  [facility] [place] [phone]  [facility] [address] [place] [address] [phone]  [facility] [address] [place] [address] [phone]  [facility] [address], [place] [address] [phone]  [facility] [address], [place] [address] [phone]  [facility] [address], [place] [address] [phone]  [facility] [address], [place] [address] [phone]  [facility] Psychiatry Inpatient [facility] [place] [phone]  [facility] [address], [place] [address] [phone]  [facility] Healthcare [address] [place] [address] [phone]  [facility] Health Unit [address], [place] [address] [phone]  [facility] [phone] [address] [place] [address] [phone]  [facility] [address] [place] [address] [phone]  [facility] [address] [place] [address] [phone]  [facility] Health Patient Placement [facility], [place] [phone]  [facility] [address] [place] [address] [phone]  [name] [address], [place] [address] [phone]      Situation ongoing, CSW to continue following and update chart as more information becomes available.      [place] Tyner Codner, MSW, LCSW  04/02/2023 1:24 PM

## 2023-04-02 NOTE — Inpatient Diabetes Management (Signed)
Inpatient Diabetes Program Recommendations  AACE/ADA: New Consensus Statement on Inpatient Glycemic Control   Target Ranges:  Prepandial:   less than 140 mg/dL      Peak postprandial:   less than 180 mg/dL (1-2 hours)      Critically ill patients:  140 - 180 mg/dL    Latest Reference Range & Units 04/01/23 08:26 04/01/23 12:26 04/01/23 17:07 04/01/23 22:14 04/02/23 10:54  Glucose-Capillary 70 - 99 mg/dL 546 (H) 270 (H) 350 (H) 175 (H) 277 (H)   Review of Glycemic Control  Diabetes history: DM2 Outpatient Diabetes medications: Glipizide 10 mg BID, Tradjenta 5 mg daily, Fortmet 500 mg BID, Lantus 20 units daily (not taking) Current orders for Inpatient glycemic control: Semglee 30 units daily, Noovolog 0-20 units TID with meals, Novolog 0-5 units at bedtime, Tradjenta 5 mg daily   Inpatient Diabetes Program Recommendations:     Insulin: Please consider increasing Semglee to 32 units daily and adding Novolog 3 units TID with meals for meal coverage if patient eats at least 50% of meals.  Thanks, Orlando Penner, RN, MSN, CDCES Diabetes Coordinator Inpatient Diabetes Program [phone] (Team Pager from 8am to 5pm)

## 2023-04-02 NOTE — ED Notes (Signed)
Unable to get temp due to pt drinking cold water.

## 2023-04-02 NOTE — Patient Outreach (Signed)
Patient currently hospitalized. CSW will await update from hospitalization regarding discharge disposition.   Hinley Brimage, LCSW [place]  [facility] Licensed Clinical Social Worker Care Coordinator  Direct Dial: [phone]

## 2023-04-02 NOTE — Progress Notes (Signed)
LCSW Progress Note  [id number]   Maria Fox  04/02/2023  12:48 AM    Inpatient Behavioral Health Placement  Pt meets inpatient criteria per [name], NP-PMHNP-BC. There are no available beds within [facility] BH system per [facility]. Referral was sent to the following facilities;    Destination  Service Provider Address Phone [facility] [address] [place] [address] [phone]  [facility] [address] [place] [address] [phone]  [facility] EFAX [address] [place], [place] [phone]  [facility] [address], [place] [address] [phone]  [facility] [address] [place], [place] [address] [phone]  [facility] [address] [place], [place] [address] [phone]  [facility] [address], [place] [address] [phone]  [facility] [address] [place] [address] [phone]  [facility] [place] [address] [place], [place] [address] [phone]  [facility] [address] [place] [address] [phone]  [facility] [address] [place], [place] [address] [phone]  [facility] [address], [place] [address] [phone]  [facility] [address] [place], [place] [address] [phone]  [facility] [address] [place] [address] [phone]  [facility] [address], [place] [address] [phone]  [facility] BED [facility] Behavioral [facility] [place] [phone]  [facility] [address] [place] [place] [address] [phone]  [facility] [address] [place] [address] [phone]  [facility] [address], [place] [address] [phone]  [facility] [address], [place] [address] [phone]  [facility] [address], [place] [address] [phone]  [facility] [address], [place] [address] [phone]  [facility]als Psychiatry Inpatient [facility] [place] [phone]  [facility] [address] [place] [address] [phone]  [facility] [address], [place] [address] [phone]  [facility] Health Unit [address], [place] [address] [phone]  [facility] [phone] [address] [place] [address] [phone]  [facility] [address], [place] [address] [phone]  [facility] [address] [place] [address] [phone]    Situation ongoing,  CSW will follow up.    Maryjean Ka, MSW, [facility] 04/02/2023 12:48 AM

## 2023-04-02 NOTE — ED Notes (Signed)
[facility] called pts significant other [name]) again to see if pt is able to return home when she is discharged. No message could be left due to the voicemail box being full.   [facility] called pts brother Janya Surti) to see if he or his wife has spoken to pts SO. Mr. Smutny said that he been to SO/pts house and left a note for him to call the provider. So far pts SO has not called. Mr. Stukey said that he would try going to the house again to speak with pts SO and ask him to call [facility] or the provider.   Jacquelynn Cree, [facility]  04/02/23

## 2023-04-03 DIAGNOSIS — R413 Other amnesia: Secondary | ICD-10-CM | POA: Diagnosis not present

## 2023-04-03 DIAGNOSIS — I130 Hypertensive heart and chronic kidney disease with heart failure and stage 1 through stage 4 chronic kidney disease, or unspecified chronic kidney disease: Secondary | ICD-10-CM | POA: Diagnosis not present

## 2023-04-03 DIAGNOSIS — I5032 Chronic diastolic (congestive) heart failure: Secondary | ICD-10-CM | POA: Diagnosis not present

## 2023-04-03 DIAGNOSIS — Z8616 Personal history of COVID-19: Secondary | ICD-10-CM | POA: Diagnosis not present

## 2023-04-03 DIAGNOSIS — R519 Headache, unspecified: Secondary | ICD-10-CM | POA: Diagnosis not present

## 2023-04-03 DIAGNOSIS — N1832 Chronic kidney disease, stage 3b: Secondary | ICD-10-CM | POA: Diagnosis not present

## 2023-04-03 DIAGNOSIS — E1122 Type 2 diabetes mellitus with diabetic chronic kidney disease: Secondary | ICD-10-CM | POA: Diagnosis not present

## 2023-04-03 DIAGNOSIS — R410 Disorientation, unspecified: Secondary | ICD-10-CM | POA: Diagnosis not present

## 2023-04-03 DIAGNOSIS — Z79899 Other long term (current) drug therapy: Secondary | ICD-10-CM | POA: Diagnosis not present

## 2023-04-03 LAB — CBG MONITORING, ED
Glucose-Capillary: 115 mg/dL — ABNORMAL HIGH (ref 70–99)
Glucose-Capillary: 215 mg/dL — ABNORMAL HIGH (ref 70–99)
Glucose-Capillary: 277 mg/dL — ABNORMAL HIGH (ref 70–99)
Glucose-Capillary: 289 mg/dL — ABNORMAL HIGH (ref 70–99)
Glucose-Capillary: 301 mg/dL — ABNORMAL HIGH (ref 70–99)

## 2023-04-03 MED ORDER — QUETIAPINE FUMARATE 50 MG PO TABS
50.0000 mg | ORAL_TABLET | Freq: Every day | ORAL | Status: DC
  Administered 2023-04-03: 50 mg via ORAL
  Filled 2023-04-03: qty 1

## 2023-04-03 NOTE — Inpatient Diabetes Management (Addendum)
Inpatient Diabetes Program Recommendations  AACE/ADA: New Consensus Statement on Inpatient Glycemic Control  Target Ranges:  Prepandial:   less than 140 mg/dL      Peak postprandial:   less than 180 mg/dL (1-2 hours)      Critically ill patients:  140 - 180 mg/dL    Latest Reference Range & Units 04/02/23 10:54 04/02/23 13:12 04/02/23 17:54 04/03/23 00:03 04/03/23 06:08  Glucose-Capillary 70 - 99 mg/dL 440 (H) 102 (H) 725 (H) 301 (H) 215 (H)   Review of Glycemic Control  Diabetes history: DM2 Outpatient Diabetes medications: Glipizide 10 mg BID, Tradjenta 5 mg daily, Fortmet 500 mg BID, Lantus 20 units daily (not taking) Current orders for Inpatient glycemic control: Semglee 30 units daily, Novolog 0-20 units TID with meals, Novolog 0-5 units at bedtime, Tradjenta 5 mg daily   Inpatient Diabetes Program Recommendations:    Insulin: Per chart, Semglee was NOT GIVEN (charted as medication not available) on 04/02/23. As a result, CBGs consistently elevated over past 24 hours. Please consider ordering Novolog 4 units TID with meals for meal coverage if patient eats at least 50% of meals.  Thanks, Orlando Penner, RN, MSN, CDCES Diabetes Coordinator Inpatient Diabetes Program [phone] (Team Pager from 8am to 5pm)

## 2023-04-03 NOTE — Progress Notes (Signed)
LCSW Progress Note  [id number]   Maria Fox  04/03/2023  2:25 AM    Inpatient Behavioral Health Placement  Pt meets inpatient criteria per Joaquim Nam. There are no available beds within [facility] BH system per [facility] Rona Ravens, RN. Referral was sent to the following facilities;   Destination  Service Provider Address Phone [facility] [address], [place] [address] [phone]  [facility] EFAX [address] [place], [place] [phone]  [facility] [address], [place] [address] [phone]  [facility] [address] [place], [place] [address] [phone]  [facility] [address] [place], [place] [address] [phone]  [facility] [address] [place] [address] [phone]  [facility] [address] [place] [address] [phone]  [facility] [address] [place], [place] [address] [phone]  [facility] [address] [place] [address] [phone]  [facility] [address] [place], [place] [address] [phone]  [facility] [address], [place] [address] [phone]  [facility] [address] [place], [place] [address] [phone]  [facility] [address] [place] [address] [phone]  [facility] [address], [place] [address] [phone]  [facility] [place] [phone]  [facility] [address] [place] [place] [address] [phone]  [facility] [address] [place] [address] [phone]  [facility] [address] [place] [address] [phone]  [facility] [address], [place] [address] [phone]  [facility] [address] [place] [address] [phone]  [facility] [address] [place] [address] [phone]  [facility] Hospitals Psychiatry Inpatient [facility] [place] [phone]  [facility] [address] [place] [address] [phone]  [facility] [address] [place] [address] [phone]  [facility] Health Unit [address] [place] [address] [phone]  [facility] [phone] [address] [place] [place] [address] [phone]  [facility] [address], [place] [address] [phone]  [facility] [address], [place] [address] [phone]  [facility] Health Patient Placement [facility] [place], [place] [phone]  [facility] [address] [place] [address] [phone]  [facility] [address], [place] [address] [phone]  [facility] [address], [place] [address] [phone]  [facility] [address] [place] [address] [phone]     Situation ongoing,  CSW will follow up.    Maryjean Ka, MSW, LCSWA 04/03/2023 2:25 AM

## 2023-04-03 NOTE — ED Notes (Signed)
Pt. Has a dry diaper on.

## 2023-04-03 NOTE — ED Provider Notes (Signed)
Emergency Medicine Observation Re-evaluation Note  Maria Fox is a 78 y.o. female, seen on rounds today.  Pt initially presented to the ED for complaints of BH symptoms. Pt with recent inpatient [facility] stay for same, but did not fill meds. No new c/o this AM.   Physical Exam  BP (!) 116/58 (BP Location: Right Arm)   Pulse 69   Temp 97.8 F (36.6 C) (Oral)   Resp 18   SpO2 95%  Physical Exam General: resting.  Cardiac: regular rate.  Lungs: breathing comfortably. Psych: calm.   ED Course / MDM   I have reviewed the labs performed to date as well as medications administered while in observation.  Recent changes in the last 24 hours include ED obs, reassessment.   Plan  Current plan is for [facility] team to update plan, and reassess. It would seem if recent [facility] admission and if [facility] team feels patient needs readmission, that patient should [facility] unit.   Dispo per [facility] team.     Cathren Laine, MD 04/03/23 [phone]

## 2023-04-03 NOTE — ED Notes (Signed)
Patient did not want breakfast or lunch - both meals discarded.

## 2023-04-03 NOTE — ED Notes (Signed)
[facility] called pts significant other [name]) to see if he able to care for her if she is discharged. Mr. Judithann Sheen answered after many attempts to reach him. Pts SO said that he is able to care for pt when she is ready to be discharged home. Pts SO is aware of pts desire to enter a long term care facility and will work with pts brother and his wife to find a suitable placement.   Jacquelynn Cree, [facility] [place]  04/03/23

## 2023-04-03 NOTE — ED Notes (Signed)
Patient calm and cooperative all day.

## 2023-04-04 ENCOUNTER — Other Ambulatory Visit: Payer: Self-pay | Admitting: Cardiology

## 2023-04-04 DIAGNOSIS — N1832 Chronic kidney disease, stage 3b: Secondary | ICD-10-CM | POA: Diagnosis not present

## 2023-04-04 DIAGNOSIS — I130 Hypertensive heart and chronic kidney disease with heart failure and stage 1 through stage 4 chronic kidney disease, or unspecified chronic kidney disease: Secondary | ICD-10-CM | POA: Diagnosis not present

## 2023-04-04 DIAGNOSIS — R519 Headache, unspecified: Secondary | ICD-10-CM | POA: Diagnosis not present

## 2023-04-04 DIAGNOSIS — E1122 Type 2 diabetes mellitus with diabetic chronic kidney disease: Secondary | ICD-10-CM | POA: Diagnosis not present

## 2023-04-04 DIAGNOSIS — Z8616 Personal history of COVID-19: Secondary | ICD-10-CM | POA: Diagnosis not present

## 2023-04-04 DIAGNOSIS — R410 Disorientation, unspecified: Secondary | ICD-10-CM | POA: Diagnosis not present

## 2023-04-04 DIAGNOSIS — F23 Brief psychotic disorder: Secondary | ICD-10-CM | POA: Diagnosis not present

## 2023-04-04 DIAGNOSIS — Z79899 Other long term (current) drug therapy: Secondary | ICD-10-CM | POA: Diagnosis not present

## 2023-04-04 DIAGNOSIS — I5032 Chronic diastolic (congestive) heart failure: Secondary | ICD-10-CM | POA: Diagnosis not present

## 2023-04-04 DIAGNOSIS — R413 Other amnesia: Secondary | ICD-10-CM | POA: Diagnosis not present

## 2023-04-04 LAB — CBG MONITORING, ED
Glucose-Capillary: 196 mg/dL — ABNORMAL HIGH (ref 70–99)
Glucose-Capillary: 217 mg/dL — ABNORMAL HIGH (ref 70–99)

## 2023-04-04 NOTE — Progress Notes (Signed)
PT Cancellation Note  Patient Details Name: Maria Fox MRN: [id number] DOB: 27-May-1945   Cancelled Treatment:      Spoke with nurse in [facility] ED and stated taht pt was up already 2 times today walking with RW independently. Will hold on this PT eval at this time unless you feel other needs for PT . Also see pt has been discharging soon.    Marella Bile 04/04/2023, 1:18 PM

## 2023-04-04 NOTE — Progress Notes (Addendum)
LCSW Progress Note  [id number]   Maria Fox  04/04/2023  8:11 AM    Inpatient Behavioral Health Placement  -CSW received a phone call from [place] with [facility] and pt has been denied.  Pt meets inpatient criteria per Phebe Colla, NP. There are no available beds within [facility] BH system per Night [facility] Kim Brooks,RN. Referral was sent to the following facilities;   Destination  Service Provider Address Phone [facility] [address] [place] [address] [phone]  [facility] EFAX [address] [place], [place] [phone]  [facility] [address], [place] [address] [phone]  [facility] [address] [place], [place] [address] [phone]  [facility] [address] [place], [place] [address] [phone]  [facility] [address] [place] [address] [phone]  [facility] Center-Geriatric [address] [place] [address] [phone]  [facility] [address] [place], [place] [address] [phone]  [facility] [address] [place] [address] [phone]  [facility] [address] [place], [place] [address] [phone]  [facility] [address] [place] [address] [phone]  [facility] [address] [place], [place] [address] [phone]  [facility] [address] [place] [address] [phone]  [facility] [address] [place] [address] [phone]  [facility] B[facility] Management Behavioral Health [place] [phone]  [facility]  [address] [place] [address] [phone]  [facility] [address] [place] [address] [phone]  [facility] [address], [place] [address] [phone]  [facility] [address], [place] [address] [phone]  [facility] [address] [place] [address] [phone]  [facility] [address], [place] [address] [phone]  [facility] Hospitals Psychiatry Inpatient [facility] [place] [phone]  [facility] [address] [place] [address] [phone]  [facility] [address] [place] [address] [phone]  [facility] Health Unit [address], [place] [address] [phone]  [facility] [phone] [address] [place] [address] [phone]  [facility] [address] [place] [address] [phone]  [facility] [address], [place] [address] [phone]  [facility] Health Patient Placement [facility] [place], [name] [phone]  [facility] [address] [place] [address] [phone]  [facility] [address], [place] [address] [phone]  [facility] [address] [place] [address] [phone]  [facility] [address] [place] [address] [phone]    Situation ongoing,  CSW will follow up.    Maryjean Ka, MSW, LCSWA 04/04/2023 8:11 AM

## 2023-04-04 NOTE — Progress Notes (Signed)
Per provider [facility] is [facility].   [place] Yulianna Folse LCSW-A   04/04/2023 12:01 PM

## 2023-04-04 NOTE — Discharge Instructions (Signed)
Please follow up with a Neurologist    Discharge recommendations:  Patient is to take medications as prescribed. Please see information for follow-up appointment with psychiatry and therapy. Please follow up with your primary care provider for all medical related needs.   Therapy: We recommend that patient participate in individual therapy to address mental health concerns.  Medications: The patient or guardian is to contact a medical professional and/or outpatient provider to address any new side effects that develop. The patient or guardian should update outpatient providers of any new medications and/or medication changes.   Atypical antipsychotics: If you are prescribed an atypical antipsychotic, it is recommended that your height, weight, BMI, blood pressure, fasting lipid panel, and fasting blood sugar be monitored by your outpatient providers.  Safety:  The patient should abstain from use of illicit substances/drugs and abuse of any medications. If symptoms worsen or do not continue to improve or if the patient becomes actively suicidal or homicidal then it is recommended that the patient return to the closest hospital emergency department, the [facility], or call 911 for further evaluation and treatment. National Suicide Prevention Lifeline 1-800-SUICIDE or (417)646-1849.  About 988 988 offers 24/7 access to trained crisis counselors who can help people experiencing mental health-related distress. People can call or text 988 or chat 988lifeline.org for themselves or if they are worried about a loved one who may need crisis support.  Crisis Mobile: [facility]:                     [phone] (for crisis response 24 hours a day) [facility] Hotline:                                            906 248 6751

## 2023-04-04 NOTE — ED Provider Notes (Addendum)
Emergency Medicine Observation Re-evaluation Note  Maria Fox is a 78 y.o. female, seen on rounds today.  Pt initially presented to the ED for complaints of Headache and Hallucinations Currently, the patient is resting comfortably in exam room asleep.  Physical Exam  BP 124/61 (BP Location: Left Arm)   Pulse 68   Temp 97.6 F (36.4 C) (Oral)   Resp 18   SpO2 97%  Physical Exam General: Resting without acute distress Cardiac: Not tachycardic on last vital signs Lungs: Symmetric rise and fall of chest wall sleeping Psych: No acute agitation  ED Course / MDM  EKG:EKG Interpretation Date/Time:  Saturday March 29 2023 16:59:04 EST Ventricular Rate:  84 PR Interval:  200 QRS Duration:  120 QT Interval:  370 QTC Calculation: 437 R Axis:   -56  Text Interpretation: remove ekg Confirmed by Zadie Rhine ([id number]) on 03/30/2023 11:38:52 AM  I have reviewed the labs performed to date as well as medications administered while in observation.  Recent changes in the last 24 hours include none reported by overnight nursing.  Plan  Current plan is for awaiting BH disposition.    Hamlet Lasecki, Canary Brim, MD 04/04/23 [phone]  12:04 PM Was told by team that patient is being psychiatric cleared and this will be discharged today.  Will discharge when family arrived to take patient home.  Patient is not under IVC at this time per chart.  I reviewed the patient's chart and it appears she was diagnosed with urinary tract infection with positive cultures during her stay with [place].  I called pharmacy and they say as of today she is now fully treated for the UTI and does not need antibiotics at discharge home.  Patient will be discharged at 2 PM with family.   Yuma Pacella, Canary Brim, MD 04/04/23 [phone]

## 2023-04-04 NOTE — ED Notes (Signed)
[facility] called pts significant other again to inform him that pt is cleared and can be picked up. Pts SO said that pts brother would be able to pick pt up and will arrive at 2pm.  Jacquelynn Cree, [facility]  04/04/23
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
## 2023-04-04 NOTE — ED Notes (Signed)
[facility] called pts significant other [name]) to inform him that pt will be discharged today and ready to be picked up at 2pm. [facility] left a message to return the call.   Jacquelynn Cree, [facility]  04/04/23

## 2023-04-04 NOTE — Progress Notes (Signed)
Inpatient Behavioral Health Placement  Per Misty Stanley with [name] Int[facility] pt has been denied due to medical acuity. CSW/Disposition team will assist and follow with [facility] placement as reccommended.   This CSW requested that pt be reviewed again by [facility]. CSW informed [facility]old [facility]ms. CSW/ Disposition team will assist and follow.  Maryjean Ka, MSW, LCSWA 04/04/2023 1:24 AM

## 2023-04-07 ENCOUNTER — Other Ambulatory Visit: Payer: Self-pay

## 2023-04-07 ENCOUNTER — Emergency Department (HOSPITAL_COMMUNITY)
Admission: EM | Admit: 2023-04-07 | Discharge: 2023-04-08 | Disposition: A | Discharge status: Home / Self Care | Payer: 59 | Attending: Student | Admitting: Student

## 2023-04-07 ENCOUNTER — Encounter (HOSPITAL_COMMUNITY): Payer: Self-pay

## 2023-04-07 DIAGNOSIS — R440 Auditory hallucinations: Secondary | ICD-10-CM

## 2023-04-07 DIAGNOSIS — Z7984 Long term (current) use of oral hypoglycemic drugs: Secondary | ICD-10-CM | POA: Insufficient documentation

## 2023-04-07 DIAGNOSIS — N1831 Chronic kidney disease, stage 3a: Secondary | ICD-10-CM | POA: Insufficient documentation

## 2023-04-07 DIAGNOSIS — Z794 Long term (current) use of insulin: Secondary | ICD-10-CM | POA: Diagnosis not present

## 2023-04-07 DIAGNOSIS — Z79899 Other long term (current) drug therapy: Secondary | ICD-10-CM | POA: Insufficient documentation

## 2023-04-07 DIAGNOSIS — E1149 Type 2 diabetes mellitus with other diabetic neurological complication: Secondary | ICD-10-CM | POA: Insufficient documentation

## 2023-04-07 DIAGNOSIS — Z955 Presence of coronary angioplasty implant and graft: Secondary | ICD-10-CM | POA: Insufficient documentation

## 2023-04-07 DIAGNOSIS — R443 Hallucinations, unspecified: Secondary | ICD-10-CM | POA: Diagnosis present

## 2023-04-07 DIAGNOSIS — I10 Essential (primary) hypertension: Secondary | ICD-10-CM | POA: Diagnosis not present

## 2023-04-07 DIAGNOSIS — Z7901 Long term (current) use of anticoagulants: Secondary | ICD-10-CM | POA: Diagnosis not present

## 2023-04-07 DIAGNOSIS — I2510 Atherosclerotic heart disease of native coronary artery without angina pectoris: Secondary | ICD-10-CM | POA: Insufficient documentation

## 2023-04-07 DIAGNOSIS — Z8616 Personal history of COVID-19: Secondary | ICD-10-CM | POA: Insufficient documentation

## 2023-04-07 DIAGNOSIS — I130 Hypertensive heart and chronic kidney disease with heart failure and stage 1 through stage 4 chronic kidney disease, or unspecified chronic kidney disease: Secondary | ICD-10-CM | POA: Insufficient documentation

## 2023-04-07 DIAGNOSIS — I5032 Chronic diastolic (congestive) heart failure: Secondary | ICD-10-CM | POA: Insufficient documentation

## 2023-04-07 DIAGNOSIS — E113293 Type 2 diabetes mellitus with mild nonproliferative diabetic retinopathy without macular edema, bilateral: Secondary | ICD-10-CM | POA: Insufficient documentation

## 2023-04-07 DIAGNOSIS — B9689 Other specified bacterial agents as the cause of diseases classified elsewhere: Secondary | ICD-10-CM | POA: Insufficient documentation

## 2023-04-07 DIAGNOSIS — N390 Urinary tract infection, site not specified: Secondary | ICD-10-CM

## 2023-04-07 LAB — COMPREHENSIVE METABOLIC PANEL
ALT: 18 U/L (ref 0–44)
AST: 23 U/L (ref 15–41)
Albumin: 4.3 g/dL (ref 3.5–5.0)
Alkaline Phosphatase: 86 U/L (ref 38–126)
Anion gap: 11 (ref 5–15)
BUN: 22 mg/dL (ref 8–23)
CO2: 21 mmol/L — ABNORMAL LOW (ref 22–32)
Calcium: 9.7 mg/dL (ref 8.9–10.3)
Chloride: 105 mmol/L (ref 98–111)
Creatinine, Ser: 1.19 mg/dL — ABNORMAL HIGH (ref 0.44–1.00)
GFR, Estimated: 46 mL/min — ABNORMAL LOW (ref >60)
Glucose, Bld: 291 mg/dL — ABNORMAL HIGH (ref 70–99)
Potassium: 4.3 mmol/L (ref 3.5–5.1)
Sodium: 137 mmol/L (ref 135–145)
Total Bilirubin: 0.7 mg/dL (ref 0.0–1.2)
Total Protein: 8.3 g/dL — ABNORMAL HIGH (ref 6.5–8.1)

## 2023-04-07 LAB — CBC WITH DIFFERENTIAL/PLATELET
Abs Immature Granulocytes: 0.04 10*3/uL (ref 0.00–0.07)
Basophils Absolute: 0.1 10*3/uL (ref 0.0–0.1)
Basophils Relative: 1 %
Eosinophils Absolute: 0.2 10*3/uL (ref 0.0–0.5)
Eosinophils Relative: 3 %
HCT: 37.9 % (ref 36.0–46.0)
Hemoglobin: 12 g/dL (ref 12.0–15.0)
Immature Granulocytes: 0 %
Lymphocytes Relative: 18 %
Lymphs Abs: 1.6 10*3/uL (ref 0.7–4.0)
MCH: 28.2 pg (ref 26.0–34.0)
MCHC: 31.7 g/dL (ref 30.0–36.0)
MCV: 89 fL (ref 80.0–100.0)
Monocytes Absolute: 0.6 10*3/uL (ref 0.1–1.0)
Monocytes Relative: 6 %
Neutro Abs: 6.5 10*3/uL (ref 1.7–7.7)
Neutrophils Relative %: 72 %
Platelets: 288 10*3/uL (ref 150–400)
RBC: 4.26 MIL/uL (ref 3.87–5.11)
RDW: 13.2 % (ref 11.5–15.5)
WBC: 9 10*3/uL (ref 4.0–10.5)
nRBC: 0 % (ref 0.0–0.2)

## 2023-04-07 LAB — URINALYSIS, ROUTINE W REFLEX MICROSCOPIC
Bilirubin Urine: NEGATIVE
Glucose, UA: 150 mg/dL — AB
Ketones, ur: NEGATIVE mg/dL
Nitrite: NEGATIVE
Protein, ur: 30 mg/dL — AB
Specific Gravity, Urine: 1.019 (ref 1.005–1.030)
WBC, UA: >50 WBC/hpf (ref 0–5)
pH: 5 (ref 5.0–8.0)

## 2023-04-07 LAB — ACETAMINOPHEN LEVEL: Acetaminophen (Tylenol), Serum: <10 ug/mL — ABNORMAL LOW (ref 10–30)

## 2023-04-07 LAB — RAPID URINE DRUG SCREEN, HOSP PERFORMED
Amphetamines: NOT DETECTED
Barbiturates: NOT DETECTED
Benzodiazepines: NOT DETECTED
Cocaine: NOT DETECTED
Opiates: NOT DETECTED
Tetrahydrocannabinol: NOT DETECTED

## 2023-04-07 LAB — ETHANOL: Alcohol, Ethyl (B): <10 mg/dL (ref <10)

## 2023-04-07 LAB — SALICYLATE LEVEL: Salicylate Lvl: <7 mg/dL — ABNORMAL LOW (ref 7.0–30.0)

## 2023-04-07 MED ORDER — TRAZODONE HCL 50 MG PO TABS: 50.0000 mg | ORAL_TABLET | Freq: Every day | ORAL | 0 refills | Status: DC

## 2023-04-07 MED ORDER — RISPERIDONE 0.5 MG PO TABS
0.5000 mg | ORAL_TABLET | Freq: Once | ORAL | Status: AC
  Administered 2023-04-07: 0.5 mg via ORAL
  Filled 2023-04-07: qty 1

## 2023-04-07 MED ORDER — TRAZODONE HCL 100 MG PO TABS
100.0000 mg | ORAL_TABLET | Freq: Every day | ORAL | Status: DC
  Administered 2023-04-07: 100 mg via ORAL
  Filled 2023-04-07: qty 1

## 2023-04-07 MED ORDER — CEPHALEXIN 500 MG PO CAPS: 500.0000 mg | ORAL_CAPSULE | Freq: Three times a day (TID) | ORAL | 0 refills | Status: DC

## 2023-04-07 MED ORDER — QUETIAPINE FUMARATE 50 MG PO TABS
50.0000 mg | ORAL_TABLET | Freq: Every day | ORAL | Status: DC
  Administered 2023-04-07: 50 mg via ORAL
  Filled 2023-04-07: qty 1

## 2023-04-07 MED ORDER — DULOXETINE HCL 30 MG PO CPEP
60.0000 mg | ORAL_CAPSULE | Freq: Once | ORAL | Status: AC
  Administered 2023-04-07: 60 mg via ORAL
  Filled 2023-04-07: qty 2

## 2023-04-07 MED ORDER — QUETIAPINE FUMARATE 50 MG PO TABS: 50.0000 mg | ORAL_TABLET | Freq: Every day | ORAL | 2 refills | Status: DC

## 2023-04-07 MED ORDER — DULOXETINE HCL 60 MG PO CPEP: 60.0000 mg | ORAL_CAPSULE | Freq: Every day | ORAL | 2 refills | Status: DC

## 2023-04-07 NOTE — ED Provider Notes (Signed)
I assumed care of this patient from previous provider.  Please see their note for further details of history, exam, and MDM.   Briefly patient is a 78 y.o. female who presented hallucinations.  Patient has a history of Alzheimer.  Appears that patient was not discharged with psych meds recently.  Given her meds here in now sleeping.  Plan to reevaluate and follow-up UA.    UA with possible infection. Grew out K. Pneumo on last culture. Will repeat culture and tx with Keflex.  The patient appears reasonably screened and/or stabilized for discharge and I doubt any other medical condition or other [facility] requiring further screening, evaluation, or treatment in the ED at this time. I have discussed the findings, Dx and Tx plan with the patient/family who expressed understanding and agree(s) with the plan. Discharge instructions discussed at length. The patient/family was given strict return precautions who verbalized understanding of the instructions. No further questions at time of discharge.  Disposition: Discharge  Condition: Good  ED Discharge Orders          Ordered    QUEtiapine (SEROQUEL) 50 MG tablet  Daily at bedtime        04/07/23 2310    DULoxetine (CYMBALTA) 60 MG capsule  Daily        04/07/23 2310    traZODone (DESYREL) 50 MG tablet  Daily at bedtime        04/07/23 2310    Urine Culture        04/07/23 2328    cephALEXin (KEFLEX) 500 MG capsule  3 times daily        04/07/23 2330              Follow Up: Eustaquio Boyden, MD [address] [place] [address] [phone]  Call  to schedule an appointment for close follow up        Nira Conn, MD 04/08/23 [phone]

## 2023-04-07 NOTE — ED Provider Triage Note (Signed)
Emergency Medicine Provider Triage Evaluation Note  Maria Fox , a 78 y.o. female  was evaluated in triage.  Pt complains of hallucinations for the past 9 days. Patient was seen here for similar and dc but continues to endorse "little men in my ear mumbling." Denies SI or HI. States she has not been able to take her meds but cannot tell me what these meds are. Denies CP shob abd pain fevers dysuria.  Review of Systems  Positive:  Negative:   Physical Exam  BP (!) 157/87 (BP Location: Right Arm)   Pulse 97   Temp 97.7 F (36.5 C) (Oral)   Resp 16   Ht 5\' 3"  (1.6 m)   Wt 59 kg   SpO2 98%   BMI 23.04 kg/m  Gen:   Awake, no distress   Resp:  Normal effort  MSK:   Moves extremities without difficulty  Other:  Endorses little men in her ear mumbling but cannot say what they are saying, denies SI or HI  Medical Decision Making  Medically screening exam initiated at 3:45 PM.  Appropriate orders placed.  Maria Fox was informed that the remainder of the evaluation will be completed by another provider, this initial triage assessment does not replace that evaluation, and the importance of remaining in the ED until their evaluation is complete.  Workup initiated, medical clearance labs ordered, patient stable at this time.   Maria Corrigan, PA-C 04/07/23 1547

## 2023-04-07 NOTE — ED Triage Notes (Addendum)
Patient reports visual and auditory hallucinations x 9 days. Was seen recently for same. Patient states it got better for 1 day and returned. Also reports headache and congestion x 3 days.

## 2023-04-08 MED ORDER — DULOXETINE HCL 60 MG PO CPEP: 60.0000 mg | ORAL_CAPSULE | Freq: Every day | ORAL | 2 refills | Status: DC

## 2023-04-08 MED ORDER — QUETIAPINE FUMARATE 50 MG PO TABS: 50.0000 mg | ORAL_TABLET | Freq: Every day | ORAL | 2 refills | Status: DC

## 2023-04-08 MED ORDER — TRAZODONE HCL 50 MG PO TABS: 50.0000 mg | ORAL_TABLET | Freq: Every day | ORAL | 0 refills | Status: DC

## 2023-04-08 MED ORDER — CEPHALEXIN 500 MG PO CAPS: 500.0000 mg | ORAL_CAPSULE | Freq: Three times a day (TID) | ORAL | 0 refills | Status: AC

## 2023-04-08 NOTE — ED Notes (Signed)
Husband and brother called for transport & d/c. Message left for husband. Spoke with brother who will be here shortly.

## 2023-04-08 NOTE — ED Notes (Signed)
No changes. Continues to sleep. Pending arrival of family to pick up and take home.
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
## 2023-04-09 ENCOUNTER — Telehealth: Payer: Self-pay

## 2023-04-09 LAB — URINE CULTURE

## 2023-04-09 NOTE — Transitions of Care (Post Inpatient/ED Visit) (Signed)
   04/09/2023  Name: Maria Fox MRN: [id number] DOB: 08/20/1945  Today's TOC FU Call Status: Today's TOC FU Call Status:: Unsuccessful Call (1st Attempt) Unsuccessful Call (1st Attempt) Date: 04/09/23  Attempted to reach the patient regarding the most recent Inpatient/ED visit.  Follow Up Plan: Additional outreach attempts will be made to reach the patient to complete the Transitions of Care (Post Inpatient/ED visit) call.   Signature Karena Addison, LPN [facility] Nurse Health Advisor Direct Dial [phone]

## 2023-04-12 ENCOUNTER — Encounter (HOSPITAL_COMMUNITY): Payer: Self-pay | Admitting: *Deleted

## 2023-04-12 ENCOUNTER — Emergency Department (HOSPITAL_COMMUNITY)
Admission: EM | Admit: 2023-04-12 | Discharge: 2023-04-21 | Disposition: A | Discharge status: Home / Self Care | Payer: 59 | Attending: Emergency Medicine | Admitting: Emergency Medicine

## 2023-04-12 ENCOUNTER — Other Ambulatory Visit: Payer: Self-pay

## 2023-04-12 DIAGNOSIS — Z7984 Long term (current) use of oral hypoglycemic drugs: Secondary | ICD-10-CM | POA: Diagnosis not present

## 2023-04-12 DIAGNOSIS — F23 Brief psychotic disorder: Secondary | ICD-10-CM | POA: Insufficient documentation

## 2023-04-12 DIAGNOSIS — I130 Hypertensive heart and chronic kidney disease with heart failure and stage 1 through stage 4 chronic kidney disease, or unspecified chronic kidney disease: Secondary | ICD-10-CM | POA: Insufficient documentation

## 2023-04-12 DIAGNOSIS — I2510 Atherosclerotic heart disease of native coronary artery without angina pectoris: Secondary | ICD-10-CM | POA: Diagnosis not present

## 2023-04-12 DIAGNOSIS — I509 Heart failure, unspecified: Secondary | ICD-10-CM | POA: Diagnosis not present

## 2023-04-12 DIAGNOSIS — Z79899 Other long term (current) drug therapy: Secondary | ICD-10-CM | POA: Diagnosis not present

## 2023-04-12 DIAGNOSIS — Z7901 Long term (current) use of anticoagulants: Secondary | ICD-10-CM | POA: Diagnosis not present

## 2023-04-12 DIAGNOSIS — F419 Anxiety disorder, unspecified: Secondary | ICD-10-CM | POA: Diagnosis not present

## 2023-04-12 DIAGNOSIS — R443 Hallucinations, unspecified: Secondary | ICD-10-CM | POA: Diagnosis not present

## 2023-04-12 DIAGNOSIS — F331 Major depressive disorder, recurrent, moderate: Secondary | ICD-10-CM | POA: Diagnosis not present

## 2023-04-12 DIAGNOSIS — F332 Major depressive disorder, recurrent severe without psychotic features: Secondary | ICD-10-CM

## 2023-04-12 DIAGNOSIS — R739 Hyperglycemia, unspecified: Secondary | ICD-10-CM

## 2023-04-12 DIAGNOSIS — N1832 Chronic kidney disease, stage 3b: Secondary | ICD-10-CM | POA: Diagnosis not present

## 2023-04-12 DIAGNOSIS — Z20822 Contact with and (suspected) exposure to covid-19: Secondary | ICD-10-CM | POA: Diagnosis not present

## 2023-04-12 DIAGNOSIS — R000 Tachycardia, unspecified: Secondary | ICD-10-CM | POA: Diagnosis not present

## 2023-04-12 DIAGNOSIS — F323 Major depressive disorder, single episode, severe with psychotic features: Secondary | ICD-10-CM | POA: Diagnosis present

## 2023-04-12 DIAGNOSIS — R45851 Suicidal ideations: Secondary | ICD-10-CM | POA: Diagnosis not present

## 2023-04-12 DIAGNOSIS — E1165 Type 2 diabetes mellitus with hyperglycemia: Secondary | ICD-10-CM | POA: Insufficient documentation

## 2023-04-12 DIAGNOSIS — Z794 Long term (current) use of insulin: Secondary | ICD-10-CM | POA: Insufficient documentation

## 2023-04-12 LAB — COMPREHENSIVE METABOLIC PANEL
ALT: 15 U/L (ref 0–44)
AST: 17 U/L (ref 15–41)
Albumin: 4.1 g/dL (ref 3.5–5.0)
Alkaline Phosphatase: 88 U/L (ref 38–126)
Anion gap: 14 (ref 5–15)
BUN: 19 mg/dL (ref 8–23)
CO2: 19 mmol/L — ABNORMAL LOW (ref 22–32)
Calcium: 9.2 mg/dL (ref 8.9–10.3)
Chloride: 101 mmol/L (ref 98–111)
Creatinine, Ser: 1.56 mg/dL — ABNORMAL HIGH (ref 0.44–1.00)
GFR, Estimated: 33 mL/min — ABNORMAL LOW (ref >60)
Glucose, Bld: 534 mg/dL (ref 70–99)
Potassium: 3.7 mmol/L (ref 3.5–5.1)
Sodium: 134 mmol/L — ABNORMAL LOW (ref 135–145)
Total Bilirubin: 0.9 mg/dL (ref 0.0–1.2)
Total Protein: 7.9 g/dL (ref 6.5–8.1)

## 2023-04-12 LAB — URINALYSIS, W/ REFLEX TO CULTURE (INFECTION SUSPECTED)
Bacteria, UA: NONE SEEN
Bilirubin Urine: NEGATIVE
Glucose, UA: >=500 mg/dL — AB
Hgb urine dipstick: NEGATIVE
Ketones, ur: NEGATIVE mg/dL
Leukocytes,Ua: NEGATIVE
Nitrite: NEGATIVE
Protein, ur: NEGATIVE mg/dL
Specific Gravity, Urine: 1.026 (ref 1.005–1.030)
pH: 5 (ref 5.0–8.0)

## 2023-04-12 LAB — SALICYLATE LEVEL: Salicylate Lvl: <7 mg/dL — ABNORMAL LOW (ref 7.0–30.0)

## 2023-04-12 LAB — BASIC METABOLIC PANEL
Anion gap: 12 (ref 5–15)
BUN: 16 mg/dL (ref 8–23)
CO2: 22 mmol/L (ref 22–32)
Calcium: 8.8 mg/dL — ABNORMAL LOW (ref 8.9–10.3)
Chloride: 108 mmol/L (ref 98–111)
Creatinine, Ser: 1.23 mg/dL — ABNORMAL HIGH (ref 0.44–1.00)
GFR, Estimated: 44 mL/min — ABNORMAL LOW (ref >60)
Glucose, Bld: 122 mg/dL — ABNORMAL HIGH (ref 70–99)
Potassium: 3.2 mmol/L — ABNORMAL LOW (ref 3.5–5.1)
Sodium: 142 mmol/L (ref 135–145)

## 2023-04-12 LAB — CBC WITH DIFFERENTIAL/PLATELET
Abs Immature Granulocytes: 0.14 10*3/uL — ABNORMAL HIGH (ref 0.00–0.07)
Basophils Absolute: 0.1 10*3/uL (ref 0.0–0.1)
Basophils Relative: 1 %
Eosinophils Absolute: 0.2 10*3/uL (ref 0.0–0.5)
Eosinophils Relative: 2 %
HCT: 37.4 % (ref 36.0–46.0)
Hemoglobin: 12.2 g/dL (ref 12.0–15.0)
Immature Granulocytes: 2 %
Lymphocytes Relative: 18 %
Lymphs Abs: 1.6 10*3/uL (ref 0.7–4.0)
MCH: 28.9 pg (ref 26.0–34.0)
MCHC: 32.6 g/dL (ref 30.0–36.0)
MCV: 88.6 fL (ref 80.0–100.0)
Monocytes Absolute: 0.8 10*3/uL (ref 0.1–1.0)
Monocytes Relative: 9 %
Neutro Abs: 6.4 10*3/uL (ref 1.7–7.7)
Neutrophils Relative %: 68 %
Platelets: 352 10*3/uL (ref 150–400)
RBC: 4.22 MIL/uL (ref 3.87–5.11)
RDW: 13.4 % (ref 11.5–15.5)
WBC: 9.3 10*3/uL (ref 4.0–10.5)
nRBC: 0 % (ref 0.0–0.2)

## 2023-04-12 LAB — CBG MONITORING, ED
Glucose-Capillary: 339 mg/dL — ABNORMAL HIGH (ref 70–99)
Glucose-Capillary: 112 mg/dL — ABNORMAL HIGH (ref 70–99)

## 2023-04-12 LAB — ETHANOL: Alcohol, Ethyl (B): <10 mg/dL (ref <10)

## 2023-04-12 LAB — RAPID URINE DRUG SCREEN, HOSP PERFORMED
Amphetamines: NOT DETECTED
Barbiturates: NOT DETECTED
Benzodiazepines: NOT DETECTED
Cocaine: NOT DETECTED
Opiates: NOT DETECTED
Tetrahydrocannabinol: NOT DETECTED

## 2023-04-12 LAB — ACETAMINOPHEN LEVEL: Acetaminophen (Tylenol), Serum: <10 ug/mL — ABNORMAL LOW (ref 10–30)

## 2023-04-12 MED ORDER — LACTATED RINGERS IV BOLUS
1000.0000 mL | Freq: Once | INTRAVENOUS | Status: AC
Start: 2023-04-12 — End: 2023-04-12
  Administered 2023-04-12: 1000 mL via INTRAVENOUS

## 2023-04-12 MED ORDER — CLOPIDOGREL BISULFATE 75 MG PO TABS
75.0000 mg | ORAL_TABLET | Freq: Every day | ORAL | Status: DC
  Administered 2023-04-13 – 2023-04-21 (×9): 75 mg via ORAL
  Filled 2023-04-12 (×9): qty 1

## 2023-04-12 MED ORDER — PANTOPRAZOLE SODIUM 40 MG PO TBEC
40.0000 mg | DELAYED_RELEASE_TABLET | Freq: Two times a day (BID) | ORAL | Status: DC
  Administered 2023-04-13 – 2023-04-21 (×18): 40 mg via ORAL
  Filled 2023-04-12 (×18): qty 1

## 2023-04-12 MED ORDER — INSULIN ASPART 100 UNIT/ML IJ SOLN
3.0000 [IU] | Freq: Three times a day (TID) | INTRAMUSCULAR | Status: DC
  Administered 2023-04-13 – 2023-04-21 (×23): 3 [IU] via SUBCUTANEOUS
  Filled 2023-04-12: qty 0.03

## 2023-04-12 MED ORDER — LINAGLIPTIN 5 MG PO TABS
5.0000 mg | ORAL_TABLET | Freq: Every day | ORAL | Status: DC
  Administered 2023-04-13 – 2023-04-21 (×9): 5 mg via ORAL
  Filled 2023-04-12 (×9): qty 1

## 2023-04-12 MED ORDER — POTASSIUM CHLORIDE 20 MEQ PO PACK
40.0000 meq | PACK | Freq: Once | ORAL | Status: AC
  Administered 2023-04-13: 40 meq via ORAL
  Filled 2023-04-12: qty 2

## 2023-04-12 MED ORDER — INSULIN ASPART 100 UNIT/ML IJ SOLN
10.0000 [IU] | Freq: Once | INTRAMUSCULAR | Status: AC
  Administered 2023-04-12: 10 [IU] via INTRAVENOUS
  Filled 2023-04-12: qty 0.1

## 2023-04-12 MED ORDER — TRAZODONE HCL 50 MG PO TABS
50.0000 mg | ORAL_TABLET | Freq: Every day | ORAL | Status: DC
  Administered 2023-04-13 – 2023-04-20 (×8): 50 mg via ORAL
  Filled 2023-04-12 (×8): qty 1

## 2023-04-12 MED ORDER — GABAPENTIN 300 MG PO CAPS
300.0000 mg | ORAL_CAPSULE | Freq: Two times a day (BID) | ORAL | Status: DC
  Administered 2023-04-13 – 2023-04-21 (×18): 300 mg via ORAL
  Filled 2023-04-12 (×18): qty 1

## 2023-04-12 MED ORDER — METOPROLOL SUCCINATE ER 50 MG PO TB24
25.0000 mg | ORAL_TABLET | Freq: Every day | ORAL | Status: DC
  Administered 2023-04-13 – 2023-04-21 (×9): 25 mg via ORAL
  Filled 2023-04-12 (×9): qty 1

## 2023-04-12 MED ORDER — ALBUTEROL SULFATE (2.5 MG/3ML) 0.083% IN NEBU: 3.0000 mL | INHALATION_SOLUTION | Freq: Four times a day (QID) | RESPIRATORY_TRACT | Status: DC | PRN

## 2023-04-12 MED ORDER — QUETIAPINE FUMARATE 50 MG PO TABS
50.0000 mg | ORAL_TABLET | Freq: Every day | ORAL | Status: DC
  Administered 2023-04-13 – 2023-04-14 (×2): 50 mg via ORAL
  Filled 2023-04-12 (×2): qty 1

## 2023-04-12 MED ORDER — SODIUM CHLORIDE 0.9 % IV BOLUS
1000.0000 mL | Freq: Once | INTRAVENOUS | Status: AC
  Administered 2023-04-12: 1000 mL via INTRAVENOUS

## 2023-04-12 MED ORDER — ATORVASTATIN CALCIUM 40 MG PO TABS
40.0000 mg | ORAL_TABLET | Freq: Every evening | ORAL | Status: DC
  Administered 2023-04-13 – 2023-04-20 (×8): 40 mg via ORAL
  Filled 2023-04-12 (×7): qty 1

## 2023-04-12 MED ORDER — APIXABAN 5 MG PO TABS
5.0000 mg | ORAL_TABLET | Freq: Two times a day (BID) | ORAL | Status: DC
  Administered 2023-04-13 – 2023-04-21 (×18): 5 mg via ORAL
  Filled 2023-04-12 (×19): qty 1

## 2023-04-12 MED ORDER — ISOSORBIDE MONONITRATE ER 30 MG PO TB24
15.0000 mg | ORAL_TABLET | Freq: Every day | ORAL | Status: DC
  Administered 2023-04-13 – 2023-04-21 (×9): 15 mg via ORAL
  Filled 2023-04-12 (×9): qty 1

## 2023-04-12 MED ORDER — INSULIN ASPART 100 UNIT/ML IJ SOLN
0.0000 [IU] | Freq: Three times a day (TID) | INTRAMUSCULAR | Status: DC
Start: 2023-04-13 — End: 2023-04-21
  Administered 2023-04-13: 3 [IU] via SUBCUTANEOUS
  Administered 2023-04-13: 2 [IU] via SUBCUTANEOUS
  Administered 2023-04-14: 3 [IU] via SUBCUTANEOUS
  Administered 2023-04-14 – 2023-04-16 (×5): 2 [IU] via SUBCUTANEOUS
  Administered 2023-04-17 (×2): 3 [IU] via SUBCUTANEOUS
  Administered 2023-04-18: 5 [IU] via SUBCUTANEOUS
  Administered 2023-04-18 (×2): 2 [IU] via SUBCUTANEOUS
  Administered 2023-04-19: 5 [IU] via SUBCUTANEOUS
  Administered 2023-04-19: 9 [IU] via SUBCUTANEOUS
  Administered 2023-04-20 (×3): 3 [IU] via SUBCUTANEOUS
  Administered 2023-04-21: 2 [IU] via SUBCUTANEOUS
  Administered 2023-04-21: 5 [IU] via SUBCUTANEOUS
  Filled 2023-04-12: qty 0.09

## 2023-04-12 MED ORDER — DULOXETINE HCL 30 MG PO CPEP
60.0000 mg | ORAL_CAPSULE | Freq: Every day | ORAL | Status: DC
  Administered 2023-04-13 – 2023-04-21 (×9): 60 mg via ORAL
  Filled 2023-04-12 (×9): qty 2

## 2023-04-12 NOTE — ED Provider Triage Note (Addendum)
Emergency Medicine Provider Triage Evaluation Note  Maria Fox , a 78 y.o. female  was evaluated in triage.  Pt complains of psych evaluation. Patient endorses SI for the past 2 days with hallucinations. Denies HI.  She took her medications prescribed on Monday 1 time and stopped taking them since she said that did not help.  Review of Systems  Positive: SI Negative: HI  Physical Exam  BP 135/83 (BP Location: Left Arm)   Pulse (!) 123   Temp 97.6 F (36.4 C) (Oral)   Resp 16   Wt 59 kg   SpO2 100%   BMI 23.03 kg/m  Gen:   Awake, no distress   Resp:  Normal effort  MSK:   Moves extremities without difficulty  Other:    Medical Decision Making  Medically screening exam initiated at 2:36 PM.  Appropriate orders placed.  Maria Fox was informed that the remainder of the evaluation will be completed by another provider, this initial triage assessment does not replace that evaluation, and the importance of remaining in the ED until their evaluation is complete.   Maxwell Marion, PA-C 04/12/23 1437    Maxwell Marion, PA-C 04/12/23 1438

## 2023-04-12 NOTE — ED Notes (Signed)
In w/c, in front of triage desk
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
## 2023-04-12 NOTE — BH Assessment (Addendum)
At 23:49, the patient was deferred to IRIS. The care coordinator, Jonny Ruiz, can be reached at [phone]. The IRIS care coordinator will notify the patient's care team once the IRIS provider is ready to evaluate the patient. The patient's care team has been updated. If the care team has any questions or concerns in the meantime, please follow up with the IRIS coordinator.

## 2023-04-12 NOTE — ED Triage Notes (Signed)
BIB brother (dropped off) from home for recurrent sx dealing with bladder and mental health, including hallucinations.  Seen Sunday/ Monday for the same in this ED, d/c'd  home from ED on Monday. States, "need to speak with her psychiatrist. Endorses hearing things, nausea, stomach discomfort, and urinary problems. Brother was her ride home on Monday. Rates discomfort 8/10.

## 2023-04-12 NOTE — ED Notes (Signed)
Rn to bedside to assess pt needs. Pt asked when she could talk to the psychiatrist. This RN asked further questions to assess the reason for a psychiatrist. Pt states that she is "seeing a flower, that grows in front of her face and make weird noises." Explained to pt that we would have to treat her medical concerns before providing a psychiatry consult. Pt stated understanding.

## 2023-04-13 ENCOUNTER — Encounter (HOSPITAL_COMMUNITY): Payer: Self-pay | Admitting: Psychiatric/Mental Health

## 2023-04-13 DIAGNOSIS — R443 Hallucinations, unspecified: Secondary | ICD-10-CM

## 2023-04-13 DIAGNOSIS — R45851 Suicidal ideations: Secondary | ICD-10-CM

## 2023-04-13 DIAGNOSIS — F332 Major depressive disorder, recurrent severe without psychotic features: Secondary | ICD-10-CM

## 2023-04-13 LAB — CBG MONITORING, ED
Glucose-Capillary: 202 mg/dL — ABNORMAL HIGH (ref 70–99)
Glucose-Capillary: 169 mg/dL — ABNORMAL HIGH (ref 70–99)
Glucose-Capillary: 187 mg/dL — ABNORMAL HIGH (ref 70–99)

## 2023-04-13 MED ORDER — ACETAMINOPHEN 325 MG PO TABS
650.0000 mg | ORAL_TABLET | Freq: Once | ORAL | Status: AC
  Administered 2023-04-14: 650 mg via ORAL
  Filled 2023-04-13: qty 2

## 2023-04-13 NOTE — ED Notes (Signed)
Patient is sleeping at this time. It is really hard to wake her up. Will continue to let her sleep and give her medication to her later.

## 2023-04-13 NOTE — ED Notes (Signed)
Patient remains drowsy and doesn't want to wake up to eat. This nurse was able to wake up patient with multiple tactile stimulation to get her to take her medication. But patient goes right back to sleep.

## 2023-04-13 NOTE — Consult Note (Cosign Needed Addendum)
[facility] Consult Note  Patient Name: Maria Fox MRN: [id number] DOB: 03-09-1946 DATE OF Consult: 04/13/2023  PRIMARY PSYCHIATRIC DIAGNOSES  1.  Hallucinations 2.  Major Depressive Disorder, Recurrent, Severe 3. Suicidal Ideations   R/O if psychosis related to delirium due to recent UTI (treatment non-compliance). R/O if hallucinations recently related to uncontrolled blood sugars. Per chart review, no past history of psychosis until she was recently diagnosed with UTI on 04/07/23.    RECOMMENDATIONS  Recommendations: Medication recommendations:  -- Continue Cymbalta 60mg  po daily for depression and anxiety -- Continue Seroquel 50mg  po at bedtime for psychosis (will monitor daytime somnolence, may need to taper down to 25mg ) -- Continue Trazodone 50mg  po at bedtime for insomnia  -- Zyprexa 5mg  PO/IM Q6H PRN for acute agitation, acute psychosis   Non-Medication/therapeutic recommendations:  -- Patient continues to endorse SI with plan, agreeable to VOL psychiatric admission -- May consider re-assessment in the morning to assess psychosis  -- Patient is requesting referral to [facility] for admission -- Suicide precautions    Is inpatient psychiatric hospitalization recommended for this patient?  -- Yes, patient meets criteria for inpatient admission due to SI with plan, depression.   Communication: Treatment team members (and family members if applicable) who were involved in treatment/care discussions and planning, and with whom we spoke or engaged with via secure text/chat, include the following: ED primary team  Thank you for involving [place] in the care of this patient. If you have any additional questions or concerns, please call [phone] and ask for me or the provider on-call.  TELEPSYCHIATRY ATTESTATION & CONSENT  As the provider for this telehealth consult, I attest that I verified the patient's identity using two separate identifiers, introduced myself to the  patient, provided my credentials, disclosed my location, and performed this encounter via a HIPAA-compliant, real-time, face-to-face, two-way, interactive audio and video platform and with the full consent and agreement of the patient (or guardian as applicable.)  Patient physical location: ED in [facility]. Telehealth provider physical location: home office in state of [place].  Video start time: 2233 [facility] Time) Video end time: 2256 (Central Time)  IDENTIFYING DATA  Maria Fox is a 78 y.o. year-old female for whom a psychiatric consultation has been ordered by the primary provider. The patient was identified using two separate identifiers.  CHIEF COMPLAINT/REASON FOR CONSULT  Hallucinations, Suicidal Ideations   HISTORY OF PRESENT ILLNESS (HPI)  The patient is an 78yo female who presented to the ED on the afternoon of 04/12/2023 with complaints of hallucinations and suicidal ideations with a plan to overdose on all her medications. Patient also complained of urinary symptoms; recently treated and diagnosed with UTI but admitted to ED physician that she had not been compliant with medications. Also had not been taking her insulin and glucose was 534 upon arrival.   Upon interview, patient is pleasant, calm and cooperative. Alert and oriented x4. Patient states she came to the hospital yesterday because she "felt funny". She goes on to talk about the rain and how she started "seeing things that were moving that weren't really moving". States she "just didn't feel exactly right" and when she arrived to the ED and labs were drawn she found out her glucose was "very high". She attributes this to her possible hallucinations. However then goes on to say how she sees a "little guy" that may be singing and dancing in her ear. She states this "little guy" appears as a flower  stem with petals but then turns into a munchkin. He "blabbers" in her ear; denies command type hallucinations. She  states she is beginning to see him less, has only seen him twice today. Patient states she feels "halfway decent" today. She reports she slept well last night and today and woke up feeling very hungry. Reports not sleeping the previous 3 nights at home. However continues to endorse depressive moods related to her medical issues and she verbalizes suicidal ideations with plan to overdose on medications. She feels if she were to leave the hospital she may attempt to harm herself. She also states her ideations may be related to hopelessness versus actually wanting to kill herself. No past attempts. No homicidal ideations. Patient is requesting to return to inpatient psychiatry for treatment. She feels her medications need to be adjusted, though she did report recent non-compliance.     PAST PSYCHIATRIC HISTORY  Per chart review, patient was recently admitted to [facility] for MDD without Psychosis 03/17/2023- 03/27/2023. She remained on home regimen Cymbalta 60mg  po daily. Zyprexa was trialed for mood stabilization however uncontrolled diabetes management caused difficulty and eventually Zyprexa was discontinued and patient was given Trazodone 50mg  at bedtime for insomnia and responded well. Was given referral for follow-up with [facility].   Was seen 04/07/2023 in the ED with complaints of auditory hallucinations, stated she was not discharged on psychiatric medications following her recent inpatient admission. Was given Seroquel 50mg  at bedtime and discharged home.   Psychotropic medications: Cymbalta 60mg  po daily, Trazodone 50mg  at bedtime and Seroquel 50mg  po at bedtime.  VOL- admission   Was supposed to follow with [facility] but did not attend appointment on 1/16 @ 1530.   Otherwise as per HPI above.  PAST MEDICAL HISTORY  Past Medical History:  Diagnosis Date   Acute cholecystitis 07/07/2019   Acute deep vein thrombosis (DVT) of left tibial vein ([name]) 07/11/2019   Acute left PCA  stroke ([name]) 07/13/2015   Angioedema    Atrial flutter ([name])    Autoimmune deficiency syndrome ([name])    CAD (coronary artery disease), native coronary artery 06/2014   Chronic diastolic CHF (congestive heart failure) ([name]) 08/27/2014   Chronic kidney disease, stage 3b ([name])    Closed fracture of maxilla ([name])    COVID-19 virus infection 03/2020   CVA (cerebral infarction) 07/2014   bilateral corona radiata - periCABG   Dermatitis    eval [name] 2011: eczema, eval [name] 2011: bx negative for lichen simplex or derm herpetiformis   DM (diabetes mellitus), type 2, uncontrolled w/neurologic complication 06/02/2012   ?autonomic neuropathy, gastroparesis (06/2014)    Epidermal cyst of neck 03/25/2017   Excised by derm Diona Browner)   HCAP (healthcare-associated pneumonia) 06/2014   History of chicken pox    HLD (hyperlipidemia)    Hx of migraines    remote   Hypertension    Lobar pneumonia ([name]) 03/02/2022   Maxillary fracture ([name])    Mitral regurgitation    Multiple allergies    mold, wool, dust, feathers   NSVT (nonsustained ventricular tachycardia) ([name])    Orthostatic hypotension 07/2015   Pleural effusion, left    RBBB    S/P lens implant    left side ([name])   Tricuspid regurgitation    UTI (urinary tract infection) 06/2014   Vitiligo      HOME MEDICATIONS  Facility Ordered Medications  Medication   [COMPLETED] insulin aspart (novoLOG) injection 10 Units   [COMPLETED] lactated ringers bolus 1,000  mL   [COMPLETED] sodium chloride 0.9 % bolus 1,000 mL   [COMPLETED] potassium chloride (KLOR-CON) packet 40 mEq   albuterol (PROVENTIL) (2.5 MG/3ML) 0.083% nebulizer solution 3 mL   apixaban (ELIQUIS) tablet 5 mg   atorvastatin (LIPITOR) tablet 40 mg   clopidogrel (PLAVIX) tablet 75 mg   DULoxetine (CYMBALTA) DR capsule 60 mg   gabapentin (NEURONTIN) capsule 300 mg   isosorbide mononitrate (IMDUR) 24 hr tablet 15 mg   linagliptin (TRADJENTA) tablet 5 mg   metoprolol succinate  (TOPROL-XL) 24 hr tablet 25 mg   pantoprazole (PROTONIX) EC tablet 40 mg   QUEtiapine (SEROQUEL) tablet 50 mg   traZODone (DESYREL) tablet 50 mg   insulin aspart (novoLOG) injection 0-9 Units   insulin aspart (novoLOG) injection 3 Units   acetaminophen (TYLENOL) tablet 650 mg   PTA Medications  Medication Sig   apixaban (ELIQUIS) 5 MG TABS tablet Take 1 tablet (5 mg total) by mouth 2 (two) times daily.   atorvastatin (LIPITOR) 40 MG tablet TAKE 1 TABLET BY MOUTH EVERY EVENING   linagliptin (TRADJENTA) 5 MG TABS tablet Take 1 tablet (5 mg total) by mouth daily. (Patient taking differently: Take 5 mg by mouth daily. In addition to metformin)   isosorbide mononitrate (IMDUR) 30 MG 24 hr tablet Take 0.5 tablets (15 mg total) by mouth daily.   metoprolol succinate (TOPROL-XL) 25 MG 24 hr tablet Take 1 tablet (25 mg total) by mouth daily.   pantoprazole (PROTONIX) 40 MG tablet Take 1 tablet (40 mg total) by mouth daily. (Patient taking differently: Take 40 mg by mouth 2 (two) times daily.)   clopidogrel (PLAVIX) 75 MG tablet Take 1 tablet (75 mg total) by mouth daily.   feeding supplement, GLUCERNA SHAKE, (GLUCERNA SHAKE) LIQD Take 237 mLs by mouth 3 (three) times daily between meals.   albuterol (PROVENTIL) (2.5 MG/3ML) 0.083% nebulizer solution Inhale 3 mLs into the lungs every 6 (six) hours as needed for wheezing or shortness of breath.   latanoprost (XALATAN) 0.005 % ophthalmic solution Place 1 drop into both eyes at bedtime.   glipiZIDE (GLUCOTROL) 10 MG tablet Take 10 mg by mouth 2 (two) times daily before a meal.   metformin (FORTAMET) 500 MG (OSM) 24 hr tablet Take 500 mg by mouth in the morning and at bedtime.   gabapentin (NEURONTIN) 300 MG capsule Take 300 mg by mouth 2 (two) times daily.   DULoxetine (CYMBALTA) 60 MG capsule Take 1 capsule (60 mg total) by mouth daily.   QUEtiapine (SEROQUEL) 50 MG tablet Take 1 tablet (50 mg total) by mouth at bedtime.   traZODone (DESYREL) 50 MG  tablet Take 1 tablet (50 mg total) by mouth at bedtime.   nitroGLYCERIN (NITROSTAT) 0.4 MG SL tablet Place 0.4 mg under the tongue every 5 (five) minutes as needed for chest pain.   Blood Glucose Monitoring Suppl DEVI 1 each by Does not apply route in the morning, at noon, and at bedtime. May substitute to any manufacturer covered by patient's insurance.   Insulin Pen Needle (PEN NEEDLES) 32G X 6 MM MISC Use 1 pen needle daily to inject insulin.   insulin glargine (LANTUS) 100 UNIT/ML Solostar Pen Inject 20 Units into the skin daily. (Patient not taking: Reported on 03/29/2023)   Multiple Vitamin (MULTIVITAMIN WITH MINERALS) TABS tablet Take 1 tablet by mouth daily. (Patient not taking: Reported on 03/29/2023)   acyclovir ointment (ZOVIRAX) 5 % Apply topically every 3 (three) hours. (Patient not taking: Reported on 03/29/2023)  hydrocortisone cream 1 % Apply topically 3 (three) times daily. (Patient not taking: Reported on 03/29/2023)   Blood Glucose Monitoring Suppl DEVI 1 each by Does not apply route in the morning, at noon, and at bedtime. May substitute to any manufacturer covered by patient's insurance.   Glucose Blood (BLOOD GLUCOSE TEST STRIPS) STRP 1 each by In Vitro route in the morning, at noon, and at bedtime. May substitute to any manufacturer covered by patient's insurance.   Lancet Device MISC 1 each by Does not apply route in the morning, at noon, and at bedtime. May substitute to any manufacturer covered by patient's insurance.   Lancets Misc. MISC 1 each by Does not apply route in the morning, at noon, and at bedtime. May substitute to any manufacturer covered by patient's insurance.   Accu-Chek Softclix Lancets lancets 1 each by Other route as directed.   cephALEXin (KEFLEX) 500 MG capsule Take 1 capsule (500 mg total) by mouth 3 (three) times daily for 7 days. (Patient not taking: Reported on 04/13/2023)     ALLERGIES  Allergies  Allergen Reactions   Bee Venom Anaphylaxis, Swelling  and Other (See Comments)    "Throat swelling"   Mushroom Extract Complex (Do Not Select) Anaphylaxis   Penicillins Anaphylaxis, Hives, Swelling and Other (See Comments)    Tolerates cephalosporins including cephalexin multiple times.  Has patient had a PCN reaction causing immediate rash, facial/tongue/throat swelling, SOB or lightheadedness with hypotension: Yes Has patient had a PCN reaction causing severe rash involving mucus membranes or skin necrosis: Yes Has patient had a PCN reaction that required hospitalization Yes Has patient had a PCN reaction occurring within the last 10 years: No     Codeine Nausea And Vomiting   Sulfa Antibiotics Nausea And Vomiting   Iohexol Itching, Swelling and Other (See Comments)    Iohexol, sold under the trade name Omnipaque among others, is a contrast agent used for X-ray imaging.   Erythromycin Base Rash    SOCIAL & SUBSTANCE USE HISTORY  Social History   Socioeconomic History   Marital status: Single    Spouse name: Not on file   Number of children: 0   Years of education: Not on file   Highest education level: Not on file  Occupational History   Occupation: mail room    Employer: [employer]   Occupation: retired  Tobacco Use   Smoking status: Never   Smokeless tobacco: Never  Vaping Use   Vaping status: Never Used  Substance and Sexual Activity   Alcohol use: No   Drug use: No   Sexual activity: Not Currently  Other Topics Concern   Not on file  Social History Narrative   Caffeine: occasional   Lives with friend, [name], 1 cat   Occupation: Environmental health practitioner, and mailer at [employer]   Edu: HS   Activity: walks daily (2-3 blocks)   Diet: good water, daily fruits/vegetables      [facility] unable to reach patient to establish care (04/2015)   Social Drivers of Health   Financial Resource Strain: Low Risk  (12/03/2022)   Overall Financial Resource Strain (CARDIA)    Difficulty of Paying Living  Expenses: Not hard at all  Food Insecurity: No Food Insecurity (03/17/2023)   Hunger Vital Sign    Worried About Running Out of Food in the Last Year: Never true    Ran Out of Food in the Last Year: Never true  Transportation Needs:  No Transportation Needs (03/17/2023)   PRAPARE - Administrator, Civil Service (Medical): No    Lack of Transportation (Non-Medical): No  Physical Activity: Inactive (12/03/2022)   Exercise Vital Sign    Days of Exercise per Week: 0 days    Minutes of Exercise per Session: 0 min  Stress: No Stress Concern Present (12/03/2022)   [employer] of Occupational Health - Occupational Stress Questionnaire    Feeling of Stress : Not at all  Social Connections: Moderately Integrated (03/17/2023)   Social Connection and Isolation Panel [NHANES]    Frequency of Communication with Friends and Family: More than three times a week    Frequency of Social Gatherings with Friends and Family: More than three times a week    Attends Religious Services: More than 4 times per year    Active Member of [employer] or Organizations: No    Attends Banker Meetings: Never    Marital Status: Living with partner   Social History   Tobacco Use  Smoking Status Never  Smokeless Tobacco Never   Social History   Substance and Sexual Activity  Alcohol Use No   Social History   Substance and Sexual Activity  Drug Use No    Additional pertinent information: lives with significant other. No drug or alcohol use. No tobacco use.     FAMILY HISTORY  Family History  Problem Relation Age of Onset   Throat cancer Father    Diabetes type II Mother    Hypertension Mother    Hyperlipidemia Mother    Colon cancer Mother    Cervical cancer Maternal Grandmother    Hypertension Brother    Hyperlipidemia Brother    Asthma Brother    Coronary artery disease Neg Hx    Stroke Neg Hx    Family Psychiatric History (if known):  Maternal Grandmother, Mother- Depression,  Hallucinations   MENTAL STATUS EXAM (MSE)  Mental Status Exam: General Appearance: Fairly Groomed, age appropriate, hospital scrubs  Orientation:  Full (Time, Place, and Person)  Memory:  Recent- fair   Concentration:  Concentration: Fair  Recall:  Fair  Attention  Good  Eye Contact:  Good  Speech:  Clear and Coherent  Language:  Good  Volume:  Normal  Mood: Depressed   Affect:  Appropriate  Thought Process:  Linear  Thought Content:  Hallucinations: Auditory Visual  Suicidal Thoughts:  Yes.  with intent/plan  Homicidal Thoughts:  No  Judgement:  Fair  Insight:  Present  Psychomotor Activity:  Normal  Akathisia:  No  Fund of Knowledge:  Fair    Assets:  Manufacturing systems engineer Desire for Improvement Housing Social Support  Cognition:  WNL  ADL's:  Intact  AIMS (if indicated):       VITALS  Blood pressure 105/62, pulse 75, temperature 98 F (36.7 C), temperature source Oral, resp. rate 12, weight 59 kg, SpO2 100%.  LABS  Admission on 04/12/2023  Component Date Value Ref Range Status   Sodium 04/12/2023 134 (L)  135 - 145 mmol/L Final   Potassium 04/12/2023 3.7  3.5 - 5.1 mmol/L Final   Chloride 04/12/2023 101  98 - 111 mmol/L Final   CO2 04/12/2023 19 (L)  22 - 32 mmol/L Final   Glucose, Bld 04/12/2023 534 (HH)  70 - 99 mg/dL Final   Comment: CRITICAL RESULT CALLED TO, READ BACK BY AND VERIFIED WITH H. SHEPHERD, RN ON 04/12/2023 AT 1557 BY SL Glucose reference range applies only to samples  taken after fasting for at least 8 hours.    BUN 04/12/2023 19  8 - 23 mg/dL Final   Creatinine, Ser 04/12/2023 1.56 (H)  0.44 - 1.00 mg/dL Final   Calcium 40/98/1191 9.2  8.9 - 10.3 mg/dL Final   Total Protein 47/82/9562 7.9  6.5 - 8.1 g/dL Final   Albumin 13/10/6576 4.1  3.5 - 5.0 g/dL Final   AST 46/96/2952 17  15 - 41 U/L Final   ALT 04/12/2023 15  0 - 44 U/L Final   Alkaline Phosphatase 04/12/2023 88  38 - 126 U/L Final   Total Bilirubin 04/12/2023 0.9  0.0 - 1.2 mg/dL  Final   GFR, Estimated 04/12/2023 33 (L)  >60 mL/min Final   Comment: (NOTE) Calculated using the CKD-EPI Creatinine Equation (2021)    Anion gap 04/12/2023 14  5 - 15 Final   Performed at [facility], [address] [place], [place] [address]   Alcohol, Ethyl (B) 04/12/2023 <10  <10 mg/dL Final   Comment: (NOTE) Lowest detectable limit for serum alcohol is 10 mg/dL.  For medical purposes only. Performed at [facility], [address] [place], [place] [address]    Opiates 04/12/2023 NONE DETECTED  NONE DETECTED Final   Cocaine 04/12/2023 NONE DETECTED  NONE DETECTED Final   Benzodiazepines 04/12/2023 NONE DETECTED  NONE DETECTED Final   Amphetamines 04/12/2023 NONE DETECTED  NONE DETECTED Final   Tetrahydrocannabinol 04/12/2023 NONE DETECTED  NONE DETECTED Final   Barbiturates 04/12/2023 NONE DETECTED  NONE DETECTED Final   Comment: (NOTE) DRUG SCREEN FOR MEDICAL PURPOSES ONLY.  IF CONFIRMATION IS NEEDED FOR ANY PURPOSE, NOTIFY LAB WITHIN 5 DAYS.  LOWEST DETECTABLE LIMITS FOR URINE DRUG SCREEN Drug Class                     Cutoff (ng/mL) Amphetamine and metabolites    1000 Barbiturate and metabolites    200 Benzodiazepine                 200 Opiates and metabolites        300 Cocaine and metabolites        300 THC                            50 Performed at [facility], [address] [place], [place] [address]    WBC 04/12/2023 9.3  4.0 - 10.5 K/uL Final   RBC 04/12/2023 4.22  3.87 - 5.11 MIL/uL Final   Hemoglobin 04/12/2023 12.2  12.0 - 15.0 g/dL Final   HCT 66/44/0347 37.4  36.0 - 46.0 % Final   MCV 04/12/2023 88.6  80.0 - 100.0 fL Final   MCH 04/12/2023 28.9  26.0 - 34.0 pg Final   MCHC 04/12/2023 32.6  30.0 - 36.0 g/dL Final   RDW 42/59/5638 13.4  11.5 - 15.5 % Final   Platelets 04/12/2023 352  150 - 400 K/uL Final   nRBC 04/12/2023 0.0  0.0 - 0.2 % Final   Neutrophils Relative % 04/12/2023 68  %  Final   Neutro Abs 04/12/2023 6.4  1.7 - 7.7 K/uL Final   Lymphocytes Relative 04/12/2023 18  % Final   Lymphs Abs 04/12/2023 1.6  0.7 - 4.0 K/uL Final   Monocytes Relative 04/12/2023 9  % Final   Monocytes Absolute 04/12/2023 0.8  0.1 - 1.0 K/uL Final   Eosinophils Relative 04/12/2023 2  %  Final   Eosinophils Absolute 04/12/2023 0.2  0.0 - 0.5 K/uL Final   Basophils Relative 04/12/2023 1  % Final   Basophils Absolute 04/12/2023 0.1  0.0 - 0.1 K/uL Final   Immature Granulocytes 04/12/2023 2  % Final   Abs Immature Granulocytes 04/12/2023 0.14 (H)  0.00 - 0.07 K/uL Final   Performed at [facility], [address], [place], [place] [address]   Specimen Source 04/12/2023 URINE, CLEAN CATCH   Final   Color, Urine 04/12/2023 YELLOW  YELLOW Final   APPearance 04/12/2023 CLEAR  CLEAR Final   Specific Gravity, Urine 04/12/2023 1.026  1.005 - 1.030 Final   pH 04/12/2023 5.0  5.0 - 8.0 Final   Glucose, UA 04/12/2023 >=500 (A)  NEGATIVE mg/dL Final   Hgb urine dipstick 04/12/2023 NEGATIVE  NEGATIVE Final   Bilirubin Urine 04/12/2023 NEGATIVE  NEGATIVE Final   Ketones, ur 04/12/2023 NEGATIVE  NEGATIVE mg/dL Final   Protein, ur 46/96/2952 NEGATIVE  NEGATIVE mg/dL Final   Nitrite 84/13/2440 NEGATIVE  NEGATIVE Final   Leukocytes,Ua 04/12/2023 NEGATIVE  NEGATIVE Final   RBC / HPF 04/12/2023 0-5  0 - 5 RBC/hpf Final   WBC, UA 04/12/2023 0-5  0 - 5 WBC/hpf Final   Comment:        Reflex urine culture not performed if WBC <=10, OR if Squamous epithelial cells >5. If Squamous epithelial cells >5 suggest recollection.    Bacteria, UA 04/12/2023 NONE SEEN  NONE SEEN Final   Squamous Epithelial / HPF 04/12/2023 0-5  0 - 5 /HPF Final   Performed at [facility], [address], [place], [place] [address]   Salicylate Lvl 04/12/2023 <7.0 (L)  7.0 - 30.0 mg/dL Final   Performed at [name], [address] [place], [place] [address]    Acetaminophen (Tylenol), Serum 04/12/2023 <10 (L)  10 - 30 ug/mL Final   Comment: (NOTE) Therapeutic concentrations vary significantly. A range of 10-30 ug/mL  may be an effective concentration for many patients. However, some  are best treated at concentrations outside of this range. Acetaminophen concentrations >150 ug/mL at 4 hours after ingestion  and >50 ug/mL at 12 hours after ingestion are often associated with  toxic reactions.  Performed at [facility], [address] [place], [place] [address]    Glucose-Capillary 04/12/2023 339 (H)  70 - 99 mg/dL Final   Glucose reference range applies only to samples taken after fasting for at least 8 hours.   Sodium 04/12/2023 142  135 - 145 mmol/L Final   DELTA CHECK NOTED   Potassium 04/12/2023 3.2 (L)  3.5 - 5.1 mmol/L Final   Chloride 04/12/2023 108  98 - 111 mmol/L Final   CO2 04/12/2023 22  22 - 32 mmol/L Final   Glucose, Bld 04/12/2023 122 (H)  70 - 99 mg/dL Final   Glucose reference range applies only to samples taken after fasting for at least 8 hours.   BUN 04/12/2023 16  8 - 23 mg/dL Final   Creatinine, Ser 04/12/2023 1.23 (H)  0.44 - 1.00 mg/dL Final   Calcium 42/59/5638 8.8 (L)  8.9 - 10.3 mg/dL Final   GFR, Estimated 04/12/2023 44 (L)  >60 mL/min Final   Comment: (NOTE) Calculated using the CKD-EPI Creatinine Equation (2021)    Anion gap 04/12/2023 12  5 - 15 Final   Performed at [facility], [address] [place], [place] [address]   Glucose-Capillary 04/12/2023  112 (H)  70 - 99 mg/dL Final   Glucose reference range applies only to samples taken after fasting for at least 8 hours.   Glucose-Capillary 04/13/2023 202 (H)  70 - 99 mg/dL Final   Glucose reference range applies only to samples taken after fasting for at least 8 hours.   Glucose-Capillary 04/13/2023 169 (H)  70 - 99 mg/dL Final   Glucose reference range applies only to samples taken after fasting for at least 8 hours.    Glucose-Capillary 04/13/2023 187 (H)  70 - 99 mg/dL Final   Glucose reference range applies only to samples taken after fasting for at least 8 hours.    PSYCHIATRIC REVIEW OF SYSTEMS (ROS)  ROS: Notable for the following relevant positive findings: Review of Systems  Psychiatric/Behavioral:  Positive for depression, hallucinations and suicidal ideas.     Additional findings:      Musculoskeletal: No abnormal movements observed      Gait & Station: Laying/Sitting      Pain Screening: Denies      Nutrition & Dental Concerns: No concerns at this time   RISK FORMULATION/ASSESSMENT  Is the patient experiencing any suicidal or homicidal ideations: Yes       Explain if yes: SI with plan to overdose on medications  Protective factors considered for safety management: access to care, willingness to seek treatment, social/ family support, housing  Risk factors/concerns considered for safety management: hallucinations, treatment non-compliance   Depression Physical illness/chronic pain Age over 39 Hopelessness Unmarried  Is there a Astronomer plan with the patient and treatment team to minimize risk factors and promote protective factors: Yes           Explain: medication management, suicide precautions, comfort measures, inpatient hospitalization   Is crisis care placement or psychiatric hospitalization recommended: Yes     Based on my current evaluation and risk assessment, patient is determined at this time to be at:  Moderate Risk  *RISK ASSESSMENT Risk assessment is a dynamic process; it is possible that this patient's condition, and risk level, may change. This should be re-evaluated and managed over time as appropriate. Please re-consult psychiatric consult services if additional assistance is needed in terms of risk assessment and management. If your team decides to discharge this patient, please advise the patient how to best access emergency psychiatric services, or to  call 911, if their condition worsens or they feel unsafe in any way.   Assunta Gambles, NP [facility]

## 2023-04-13 NOTE — ED Notes (Signed)
 Meal tray delivered.

## 2023-04-13 NOTE — Consult Note (Signed)
  NP attempted to reassess face to face- patient is difficult to arouse, RN reported patient had night time Seroquel/ trazodone at 1am- will follow-up when patient is able to participate in assessment. - Recruitment consultant at bedside

## 2023-04-13 NOTE — ED Notes (Signed)
Sitter present at bedside. Patient sleeping at this time.

## 2023-04-13 NOTE — ED Notes (Addendum)
Pt refusing meal tray  Patient remains drowsy.  Patient admits to SI thoughts states that her plan "is a secret" and that she is "sick in the head".

## 2023-04-13 NOTE — ED Notes (Signed)
Patient continues to sleep tactile stimuli needed to wake patient then she falls back to sleep. Even and unlabored respirations noted.

## 2023-04-13 NOTE — ED Notes (Signed)
Attempted to wake patient again. She replies "yes, I am hungry, I haven't had breakfast or lunch". Reminded patient that we have tried to get her to eat and wake her up multiple times. Patient soon returns to sleeping, even and unlabored respirations noted. Sitter remains at bedside.

## 2023-04-13 NOTE — Consult Note (Incomplete)
[facility] Consult Note  Patient Name: Maria Fox MRN: [id number] DOB: 03/13/1945 DATE OF Consult: 04/13/2023  PRIMARY PSYCHIATRIC DIAGNOSES  1.   2.  *** 3.  ***  RECOMMENDATIONS  Recommendations: Medication recommendations: ***  Non-Medication/therapeutic recommendations: ***  Is inpatient psychiatric hospitalization recommended for this patient? {Yes/No/why:304550013}  Communication: Treatment team members (and family members if applicable) who were involved in treatment/care discussions and planning, and with whom we spoke or engaged with via secure text/chat, include the following: ***  Thank you for involving [place] in the care of this patient. If you have any additional questions or concerns, please call [phone] and ask for me or the provider on-call.  TELEPSYCHIATRY ATTESTATION & CONSENT  As the provider for this telehealth consult, I attest that I verified the patient's identity using two separate identifiers, introduced myself to the patient, provided my credentials, disclosed my location, and performed this encounter via a HIPAA-compliant, real-time, face-to-face, two-way, interactive audio and video platform and with the full consent and agreement of the patient (or guardian as applicable.)  Patient physical location: ED in [facility]. Telehealth provider physical location: home office in state of [place].  Video start time: 2233 [facility] Time) Video end time: 2256 (Central Time)  IDENTIFYING DATA  Maria Fox is a 78 y.o. year-old female for whom a psychiatric consultation has been ordered by the primary provider. The patient was identified using two separate identifiers.  CHIEF COMPLAINT/REASON FOR CONSULT  Hallucinations, Suicidal Ideations   HISTORY OF PRESENT ILLNESS (HPI)  The patient is an 78yo female who presented to the ED on the afternoon of 04/12/2023 with complaints of hallucinations and suicidal ideations with a plan to overdose on all  her medications. Patient also complained of urinary symptoms; recently treated and diagnosed with UTI but admitted to ED physician that she had not been compliant with medications. Also had not been taking her insulin and glucose was 534 upon arrival.   Upon interview, patient is pleasant, calm and cooperative. Alert and oriented x4. Patient states she came to the hospital yesterday because she "felt funny". She goes on to talk about the rain and how she started "seeing things that were moving that weren't really moving". States she "just didn't feel exactly right" and when she arrived to the ED and labs were drawn she found out her glucose was "very high". She attributes this to her    Felt funny The rain came and I got wet Seeing things that were moving that weren't moving.  Just didn't feel exactly right.  Mentions her blood sugar was very high.   Little guy, singing in her ear, and he leaves. He dances.  Blood sugar 534   Halfway decent- today Mentions she has been sleeping a lot int he hospital, missed a few meals. Woke up this afternoon very hungry. Was able to eat.  Passive SI- with plan to overdose on medications  No past suicide attempts No homicidal ideations  AH- voices make "blabbering voices". Non-command type.  VH- four times- tall flower stem, then turns into munchkin with flower petals.  Seen twice today- last time late afternoon.     Depressed-  No active SI.   Family/ support system.    PAST PSYCHIATRIC HISTORY  Per chart review, patient was recently admitted to [facility] for MDD without Psychosis 03/17/2023- 03/27/2023. She remained on home regimen Cymbalta 60mg  po daily. Zyprexa was trialed for mood stabilization however uncontrolled diabetes management caused  difficulty and eventually Zyprexa was discontinued and patient was given Trazodone 50mg  at bedtime for insomnia and responded well. Was given referral for follow-up with [facility].   Was  seen 04/07/2023 in the ED with complaints of auditory hallucinations, stated she was not discharged on psychiatric medications following her recent inpatient admission. Was given Seroquel 50mg  at bedtime and discharged home.   Psychotropic medications: Cymbalta 60mg  po daily, Trazodone 50mg  at bedtime and Seroquel 50mg  po at bedtime.  VOL- admission      Otherwise as per HPI above.  PAST MEDICAL HISTORY  Past Medical History:  Diagnosis Date  . Acute cholecystitis 07/07/2019  . Acute deep vein thrombosis (DVT) of left tibial vein ([name]) 07/11/2019  . Acute left PCA stroke ([name]) 07/13/2015  . Angioedema   . Atrial flutter ([name])   . Autoimmune deficiency syndrome ([name])   . CAD (coronary artery disease), native coronary artery 06/2014  . Chronic diastolic CHF (congestive heart failure) ([name]) 08/27/2014  . Chronic kidney disease, stage 3b ([name])   . Closed fracture of maxilla ([name])   . COVID-19 virus infection 03/2020  . CVA (cerebral infarction) 07/2014   bilateral corona radiata - periCABG  . Dermatitis    eval [name] 2011: eczema, eval [name] 2011: bx negative for lichen simplex or derm herpetiformis  . DM (diabetes mellitus), type 2, uncontrolled w/neurologic complication 06/02/2012   ?autonomic neuropathy, gastroparesis (06/2014)   . Epidermal cyst of neck 03/25/2017   Excised by derm Diona Browner)  . HCAP (healthcare-associated pneumonia) 06/2014  . History of chicken pox   . HLD (hyperlipidemia)   . Hx of migraines    remote  . Hypertension   . Lobar pneumonia ([name]) 03/02/2022  . Maxillary fracture ([name])   . Mitral regurgitation   . Multiple allergies    mold, wool, dust, feathers  . NSVT (nonsustained ventricular tachycardia) ([name])   . Orthostatic hypotension 07/2015  . Pleural effusion, left   . RBBB   . S/P lens implant    left side ([name])  . Tricuspid regurgitation   . UTI (urinary tract infection) 06/2014  . Vitiligo      HOME MEDICATIONS  Facility Ordered  Medications  Medication  . [COMPLETED] insulin aspart (novoLOG) injection 10 Units  . [COMPLETED] lactated ringers bolus 1,000 mL  . [COMPLETED] sodium chloride 0.9 % bolus 1,000 mL  . [COMPLETED] potassium chloride (KLOR-CON) packet 40 mEq  . albuterol (PROVENTIL) (2.5 MG/3ML) 0.083% nebulizer solution 3 mL  . apixaban (ELIQUIS) tablet 5 mg  . atorvastatin (LIPITOR) tablet 40 mg  . clopidogrel (PLAVIX) tablet 75 mg  . DULoxetine (CYMBALTA) DR capsule 60 mg  . gabapentin (NEURONTIN) capsule 300 mg  . isosorbide mononitrate (IMDUR) 24 hr tablet 15 mg  . linagliptin (TRADJENTA) tablet 5 mg  . metoprolol succinate (TOPROL-XL) 24 hr tablet 25 mg  . pantoprazole (PROTONIX) EC tablet 40 mg  . QUEtiapine (SEROQUEL) tablet 50 mg  . traZODone (DESYREL) tablet 50 mg  . insulin aspart (novoLOG) injection 0-9 Units  . insulin aspart (novoLOG) injection 3 Units  . acetaminophen (TYLENOL) tablet 650 mg   PTA Medications  Medication Sig  . apixaban (ELIQUIS) 5 MG TABS tablet Take 1 tablet (5 mg total) by mouth 2 (two) times daily.  Marland Kitchen atorvastatin (LIPITOR) 40 MG tablet TAKE 1 TABLET BY MOUTH EVERY EVENING  . linagliptin (TRADJENTA) 5 MG TABS tablet Take 1 tablet (5 mg total) by mouth daily. (Patient taking differently: Take 5 mg by mouth daily. In  addition to metformin)  . isosorbide mononitrate (IMDUR) 30 MG 24 hr tablet Take 0.5 tablets (15 mg total) by mouth daily.  . metoprolol succinate (TOPROL-XL) 25 MG 24 hr tablet Take 1 tablet (25 mg total) by mouth daily.  . pantoprazole (PROTONIX) 40 MG tablet Take 1 tablet (40 mg total) by mouth daily. (Patient taking differently: Take 40 mg by mouth 2 (two) times daily.)  . clopidogrel (PLAVIX) 75 MG tablet Take 1 tablet (75 mg total) by mouth daily.  . feeding supplement, GLUCERNA SHAKE, (GLUCERNA SHAKE) LIQD Take 237 mLs by mouth 3 (three) times daily between meals.  Marland Kitchen albuterol (PROVENTIL) (2.5 MG/3ML) 0.083% nebulizer solution Inhale 3 mLs into the  lungs every 6 (six) hours as needed for wheezing or shortness of breath.  . latanoprost (XALATAN) 0.005 % ophthalmic solution Place 1 drop into both eyes at bedtime.  Marland Kitchen glipiZIDE (GLUCOTROL) 10 MG tablet Take 10 mg by mouth 2 (two) times daily before a meal.  . metformin (FORTAMET) 500 MG (OSM) 24 hr tablet Take 500 mg by mouth in the morning and at bedtime.  . gabapentin (NEURONTIN) 300 MG capsule Take 300 mg by mouth 2 (two) times daily.  . DULoxetine (CYMBALTA) 60 MG capsule Take 1 capsule (60 mg total) by mouth daily.  . QUEtiapine (SEROQUEL) 50 MG tablet Take 1 tablet (50 mg total) by mouth at bedtime.  . traZODone (DESYREL) 50 MG tablet Take 1 tablet (50 mg total) by mouth at bedtime.  . nitroGLYCERIN (NITROSTAT) 0.4 MG SL tablet Place 0.4 mg under the tongue every 5 (five) minutes as needed for chest pain.  . Blood Glucose Monitoring Suppl DEVI 1 each by Does not apply route in the morning, at noon, and at bedtime. May substitute to any manufacturer covered by patient's insurance.  . Insulin Pen Needle (PEN NEEDLES) 32G X 6 MM MISC Use 1 pen needle daily to inject insulin.  Marland Kitchen insulin glargine (LANTUS) 100 UNIT/ML Solostar Pen Inject 20 Units into the skin daily. (Patient not taking: Reported on 03/29/2023)  . Multiple Vitamin (MULTIVITAMIN WITH MINERALS) TABS tablet Take 1 tablet by mouth daily. (Patient not taking: Reported on 03/29/2023)  . acyclovir ointment (ZOVIRAX) 5 % Apply topically every 3 (three) hours. (Patient not taking: Reported on 03/29/2023)  . hydrocortisone cream 1 % Apply topically 3 (three) times daily. (Patient not taking: Reported on 03/29/2023)  . Blood Glucose Monitoring Suppl DEVI 1 each by Does not apply route in the morning, at noon, and at bedtime. May substitute to any manufacturer covered by patient's insurance.  . Glucose Blood (BLOOD GLUCOSE TEST STRIPS) STRP 1 each by In Vitro route in the morning, at noon, and at bedtime. May substitute to any manufacturer covered  by patient's insurance.  Demetra Shiner Device MISC 1 each by Does not apply route in the morning, at noon, and at bedtime. May substitute to any manufacturer covered by patient's insurance.  . Lancets Misc. MISC 1 each by Does not apply route in the morning, at noon, and at bedtime. May substitute to any manufacturer covered by patient's insurance.  . Accu-Chek Softclix Lancets lancets 1 each by Other route as directed.  . cephALEXin (KEFLEX) 500 MG capsule Take 1 capsule (500 mg total) by mouth 3 (three) times daily for 7 days. (Patient not taking: Reported on 04/13/2023)     ALLERGIES  Allergies  Allergen Reactions  . Bee Venom Anaphylaxis, Swelling and Other (See Comments)    "Throat swelling"  . Mushroom  Extract Complex (Do Not Select) Anaphylaxis  . Penicillins Anaphylaxis, Hives, Swelling and Other (See Comments)    Tolerates cephalosporins including cephalexin multiple times.  Has patient had a PCN reaction causing immediate rash, facial/tongue/throat swelling, SOB or lightheadedness with hypotension: Yes Has patient had a PCN reaction causing severe rash involving mucus membranes or skin necrosis: Yes Has patient had a PCN reaction that required hospitalization Yes Has patient had a PCN reaction occurring within the last 10 years: No    . Codeine Nausea And Vomiting  . Sulfa Antibiotics Nausea And Vomiting  . Iohexol Itching, Swelling and Other (See Comments)    Iohexol, sold under the trade name Omnipaque among others, is a contrast agent used for X-ray imaging.  . Erythromycin Base Rash    SOCIAL & SUBSTANCE USE HISTORY  Social History   Socioeconomic History  . Marital status: Single    Spouse name: Not on file  . Number of children: 0  . Years of education: Not on file  . Highest education level: Not on file  Occupational History  . Occupation: Lawyer: [employer]  . Occupation: retired  Tobacco Use  . Smoking status: Never  . Smokeless  tobacco: Never  Vaping Use  . Vaping status: Never Used  Substance and Sexual Activity  . Alcohol use: No  . Drug use: No  . Sexual activity: Not Currently  Other Topics Concern  . Not on file  Social History Narrative   Caffeine: occasional   Lives with friend, [name], 1 cat   Occupation: Environmental health practitioner, and mailer at [employer]   Edu: HS   Activity: walks daily (2-3 blocks)   Diet: good water, daily fruits/vegetables      [facility] unable to reach patient to establish care (04/2015)   Social Drivers of Health   Financial Resource Strain: Low Risk  (12/03/2022)   Overall Financial Resource Strain (CARDIA)   . Difficulty of Paying Living Expenses: Not hard at all  Food Insecurity: No Food Insecurity (03/17/2023)   Hunger Vital Sign   . Worried About Programme researcher, broadcasting/film/video in the Last Year: Never true   . Ran Out of Food in the Last Year: Never true  Transportation Needs: No Transportation Needs (03/17/2023)   PRAPARE - Transportation   . Lack of Transportation (Medical): No   . Lack of Transportation (Non-Medical): No  Physical Activity: Inactive (12/03/2022)   Exercise Vital Sign   . Days of Exercise per Week: 0 days   . Minutes of Exercise per Session: 0 min  Stress: No Stress Concern Present (12/03/2022)   [employer] of Occupational Health - Occupational Stress Questionnaire   . Feeling of Stress : Not at all  Social Connections: Moderately Integrated (03/17/2023)   Social Connection and Isolation Panel [NHANES]   . Frequency of Communication with Friends and Family: More than three times a week   . Frequency of Social Gatherings with Friends and Family: More than three times a week   . Attends Religious Services: More than 4 times per year   . Active Member of Clubs or Organizations: No   . Attends Banker Meetings: Never   . Marital Status: Living with partner   Social History   Tobacco Use  Smoking Status Never  Smokeless Tobacco Never    Social History   Substance and Sexual Activity  Alcohol Use No   Social History   Substance  and Sexual Activity  Drug Use No    Additional pertinent information: lives with significant other. No drug or alcohol use. No tobacco use.     FAMILY HISTORY  Family History  Problem Relation Age of Onset  . Throat cancer Father   . Diabetes type II Mother   . Hypertension Mother   . Hyperlipidemia Mother   . Colon cancer Mother   . Cervical cancer Maternal Grandmother   . Hypertension Brother   . Hyperlipidemia Brother   . Asthma Brother   . Coronary artery disease Neg Hx   . Stroke Neg Hx    Family Psychiatric History (if known):  Maternal Grandmother, Mother- Depression, Hallucinations   MENTAL STATUS EXAM (MSE)  Mental Status Exam: General Appearance: {Appearance:22683}  Orientation:  {BHH ORIENTATION (PAA):22689}  Memory:  {BHH MEMORY:22881}  Concentration:  {Concentration:21399}  Recall:  {BHH GOOD/FAIR/POOR:22877}  Attention  {BH Attention Span:31825}  Eye Contact:  {BHH EYE CONTACT:22684}  Speech:  {Speech:22685}  Language:  {BHH GOOD/FAIR/POOR:22877}  Volume:  {Volume (PAA):22686}  Mood: ***  Affect:  {Affect (PAA):22687}  Thought Process:  {Thought Process (PAA):22688}  Thought Content:  {Thought Content:22690}  Suicidal Thoughts:  {ST/HT (PAA):22692}  Homicidal Thoughts:  {ST/HT (PAA):22692}  Judgement:  {Judgement (PAA):22694}  Insight:  {Insight (PAA):22695}  Psychomotor Activity:  {Psychomotor (PAA):22696}  Akathisia:  {BHH YES OR NO:22294}  Fund of Knowledge:  {BHH GOOD/FAIR/POOR:22877}    Assets:  {Assets (PAA):22698}  Cognition:  {chl bhh cognition:304700322}  ADL's:  {BHH ZOX'W:96045}  AIMS (if indicated):       VITALS  Blood pressure 105/62, pulse 75, temperature 98 F (36.7 C), temperature source Oral, resp. rate 12, weight 59 kg, SpO2 100%.  LABS  Admission on 04/12/2023  Component Date Value Ref Range Status  . Sodium 04/12/2023 134  (L)  135 - 145 mmol/L Final  . Potassium 04/12/2023 3.7  3.5 - 5.1 mmol/L Final  . Chloride 04/12/2023 101  98 - 111 mmol/L Final  . CO2 04/12/2023 19 (L)  22 - 32 mmol/L Final  . Glucose, Bld 04/12/2023 534 (HH)  70 - 99 mg/dL Final   Comment: CRITICAL RESULT CALLED TO, READ BACK BY AND VERIFIED WITH H. SHEPHERD, RN ON 04/12/2023 AT 1557 BY SL Glucose reference range applies only to samples taken after fasting for at least 8 hours.   . BUN 04/12/2023 19  8 - 23 mg/dL Final  . Creatinine, Ser 04/12/2023 1.56 (H)  0.44 - 1.00 mg/dL Final  . Calcium 40/98/1191 9.2  8.9 - 10.3 mg/dL Final  . Total Protein 04/12/2023 7.9  6.5 - 8.1 g/dL Final  . Albumin 47/82/9562 4.1  3.5 - 5.0 g/dL Final  . AST 13/10/6576 17  15 - 41 U/L Final  . ALT 04/12/2023 15  0 - 44 U/L Final  . Alkaline Phosphatase 04/12/2023 88  38 - 126 U/L Final  . Total Bilirubin 04/12/2023 0.9  0.0 - 1.2 mg/dL Final  . GFR, Estimated 04/12/2023 33 (L)  >60 mL/min Final   Comment: (NOTE) Calculated using the CKD-EPI Creatinine Equation (2021)   . Anion gap 04/12/2023 14  5 - 15 Final   Performed at [facility], [address] [place], [place] [address]  . Alcohol, Ethyl (B) 04/12/2023 <10  <10 mg/dL Final   Comment: (NOTE) Lowest detectable limit for serum alcohol is 10 mg/dL.  For medical purposes only. Performed at [facility], [address], [place], [place] [address]   .  Opiates 04/12/2023 NONE DETECTED  NONE DETECTED Final  . Cocaine 04/12/2023 NONE DETECTED  NONE DETECTED Final  . Benzodiazepines 04/12/2023 NONE DETECTED  NONE DETECTED Final  . Amphetamines 04/12/2023 NONE DETECTED  NONE DETECTED Final  . Tetrahydrocannabinol 04/12/2023 NONE DETECTED  NONE DETECTED Final  . Barbiturates 04/12/2023 NONE DETECTED  NONE DETECTED Final   Comment: (NOTE) DRUG SCREEN FOR MEDICAL PURPOSES ONLY.  IF CONFIRMATION IS NEEDED FOR ANY PURPOSE, NOTIFY LAB WITHIN 5 DAYS.  LOWEST  DETECTABLE LIMITS FOR URINE DRUG SCREEN Drug Class                     Cutoff (ng/mL) Amphetamine and metabolites    1000 Barbiturate and metabolites    200 Benzodiazepine                 200 Opiates and metabolites        300 Cocaine and metabolites        300 THC                            50 Performed at [facility], [address], [place], [place] [address]   . WBC 04/12/2023 9.3  4.0 - 10.5 K/uL Final  . RBC 04/12/2023 4.22  3.87 - 5.11 MIL/uL Final  . Hemoglobin 04/12/2023 12.2  12.0 - 15.0 g/dL Final  . HCT 29/56/2130 37.4  36.0 - 46.0 % Final  . MCV 04/12/2023 88.6  80.0 - 100.0 fL Final  . MCH 04/12/2023 28.9  26.0 - 34.0 pg Final  . MCHC 04/12/2023 32.6  30.0 - 36.0 g/dL Final  . RDW 86/57/8469 13.4  11.5 - 15.5 % Final  . Platelets 04/12/2023 352  150 - 400 K/uL Final  . nRBC 04/12/2023 0.0  0.0 - 0.2 % Final  . Neutrophils Relative % 04/12/2023 68  % Final  . Neutro Abs 04/12/2023 6.4  1.7 - 7.7 K/uL Final  . Lymphocytes Relative 04/12/2023 18  % Final  . Lymphs Abs 04/12/2023 1.6  0.7 - 4.0 K/uL Final  . Monocytes Relative 04/12/2023 9  % Final  . Monocytes Absolute 04/12/2023 0.8  0.1 - 1.0 K/uL Final  . Eosinophils Relative 04/12/2023 2  % Final  . Eosinophils Absolute 04/12/2023 0.2  0.0 - 0.5 K/uL Final  . Basophils Relative 04/12/2023 1  % Final  . Basophils Absolute 04/12/2023 0.1  0.0 - 0.1 K/uL Final  . Immature Granulocytes 04/12/2023 2  % Final  . Abs Immature Granulocytes 04/12/2023 0.14 (H)  0.00 - 0.07 K/uL Final   Performed at [facility], [address], [place], [place] [address]  . Specimen Source 04/12/2023 URINE, CLEAN CATCH   Final  . Color, Urine 04/12/2023 YELLOW  YELLOW Final  . APPearance 04/12/2023 CLEAR  CLEAR Final  . Specific Gravity, Urine 04/12/2023 1.026  1.005 - 1.030 Final  . pH 04/12/2023 5.0  5.0 - 8.0 Final  . Glucose, UA 04/12/2023 >=500 (A)  NEGATIVE mg/dL Final  . Hgb urine dipstick  04/12/2023 NEGATIVE  NEGATIVE Final  . Bilirubin Urine 04/12/2023 NEGATIVE  NEGATIVE Final  . Ketones, ur 04/12/2023 NEGATIVE  NEGATIVE mg/dL Final  . Protein, ur 84/13/2440 NEGATIVE  NEGATIVE mg/dL Final  . Nitrite 01/05/2535 NEGATIVE  NEGATIVE Final  . Leukocytes,Ua 04/12/2023 NEGATIVE  NEGATIVE Final  . RBC / HPF 04/12/2023 0-5  0 - 5 RBC/hpf Final  . WBC, UA 04/12/2023  0-5  0 - 5 WBC/hpf Final   Comment:        Reflex urine culture not performed if WBC <=10, OR if Squamous epithelial cells >5. If Squamous epithelial cells >5 suggest recollection.   . Bacteria, UA 04/12/2023 NONE SEEN  NONE SEEN Final  . Squamous Epithelial / HPF 04/12/2023 0-5  0 - 5 /HPF Final   Performed at [facility], [address], [place], [place] [address]  . Salicylate Lvl 04/12/2023 <7.0 (L)  7.0 - 30.0 mg/dL Final   Performed at [facility], [address] [place], [place] [address]  . Acetaminophen (Tylenol), Serum 04/12/2023 <10 (L)  10 - 30 ug/mL Final   Comment: (NOTE) Therapeutic concentrations vary significantly. A range of 10-30 ug/mL  may be an effective concentration for many patients. However, some  are best treated at concentrations outside of this range. Acetaminophen concentrations >150 ug/mL at 4 hours after ingestion  and >50 ug/mL at 12 hours after ingestion are often associated with  toxic reactions.  Performed at [facility], [address] [place], [place] [address]   . Glucose-Capillary 04/12/2023 339 (H)  70 - 99 mg/dL Final   Glucose reference range applies only to samples taken after fasting for at least 8 hours.  . Sodium 04/12/2023 142  135 - 145 mmol/L Final   DELTA CHECK NOTED  . Potassium 04/12/2023 3.2 (L)  3.5 - 5.1 mmol/L Final  . Chloride 04/12/2023 108  98 - 111 mmol/L Final  . CO2 04/12/2023 22  22 - 32 mmol/L Final  . Glucose, Bld 04/12/2023 122 (H)  70 - 99 mg/dL Final   Glucose reference range  applies only to samples taken after fasting for at least 8 hours.  . BUN 04/12/2023 16  8 - 23 mg/dL Final  . Creatinine, Ser 04/12/2023 1.23 (H)  0.44 - 1.00 mg/dL Final  . Calcium 57/84/6962 8.8 (L)  8.9 - 10.3 mg/dL Final  . GFR, Estimated 04/12/2023 44 (L)  >60 mL/min Final   Comment: (NOTE) Calculated using the CKD-EPI Creatinine Equation (2021)   . Anion gap 04/12/2023 12  5 - 15 Final   Performed at [facility], [address], [place], [place] [address]  . Glucose-Capillary 04/12/2023 112 (H)  70 - 99 mg/dL Final   Glucose reference range applies only to samples taken after fasting for at least 8 hours.  . Glucose-Capillary 04/13/2023 202 (H)  70 - 99 mg/dL Final   Glucose reference range applies only to samples taken after fasting for at least 8 hours.  . Glucose-Capillary 04/13/2023 169 (H)  70 - 99 mg/dL Final   Glucose reference range applies only to samples taken after fasting for at least 8 hours.  . Glucose-Capillary 04/13/2023 187 (H)  70 - 99 mg/dL Final   Glucose reference range applies only to samples taken after fasting for at least 8 hours.    PSYCHIATRIC REVIEW OF SYSTEMS (ROS)  ROS: Notable for the following relevant positive findings: ROS  Additional findings:      Musculoskeletal: {Musculoskeletal neeeds/assessment:304550014}      Gait & Station: {Gait and Station:304550016}      Pain Screening: {Pain Description:304550015}      Nutrition & Dental Concerns: {Nutrition & Dental Concerns:304550017}  RISK FORMULATION/ASSESSMENT  Is the patient experiencing any suicidal or homicidal ideations: {yes/no:20286}       Explain if yes: *** Protective factors considered for safety management: ***  Risk factors/concerns considered for safety management: *** {CHL BH Risk Factors Safety Management:304550011}  Is there a safety management plan with the patient and treatment team to minimize risk factors and promote protective factors: {yes/no:20286}            Explain: *** Is crisis care placement or psychiatric hospitalization recommended: {yes/no:20286}     Based on my current evaluation and risk assessment, patient is determined at this time to be at:  {Risk level:304550009}  *RISK ASSESSMENT Risk assessment is a dynamic process; it is possible that this patient's condition, and risk level, may change. This should be re-evaluated and managed over time as appropriate. Please re-consult psychiatric consult services if additional assistance is needed in terms of risk assessment and management. If your team decides to discharge this patient, please advise the patient how to best access emergency psychiatric services, or to call 911, if their condition worsens or they feel unsafe in any way.   Assunta Gambles, NP Telepsychiatry Consult Services

## 2023-04-14 DIAGNOSIS — F331 Major depressive disorder, recurrent, moderate: Secondary | ICD-10-CM | POA: Diagnosis not present

## 2023-04-14 DIAGNOSIS — F332 Major depressive disorder, recurrent severe without psychotic features: Secondary | ICD-10-CM

## 2023-04-14 DIAGNOSIS — R443 Hallucinations, unspecified: Secondary | ICD-10-CM

## 2023-04-14 LAB — CBG MONITORING, ED
Glucose-Capillary: 152 mg/dL — ABNORMAL HIGH (ref 70–99)
Glucose-Capillary: 160 mg/dL — ABNORMAL HIGH (ref 70–99)
Glucose-Capillary: 166 mg/dL — ABNORMAL HIGH (ref 70–99)
Glucose-Capillary: 221 mg/dL — ABNORMAL HIGH (ref 70–99)
Glucose-Capillary: 60 mg/dL — ABNORMAL LOW (ref 70–99)
Glucose-Capillary: 80 mg/dL (ref 70–99)

## 2023-04-14 MED ORDER — RISPERIDONE 0.5 MG PO TBDP
0.5000 mg | ORAL_TABLET | Freq: Two times a day (BID) | ORAL | Status: DC
  Administered 2023-04-14 – 2023-04-17 (×7): 0.5 mg via ORAL
  Filled 2023-04-14 (×7): qty 1

## 2023-04-14 MED ORDER — CARMEX CLASSIC LIP BALM EX OINT
TOPICAL_OINTMENT | Freq: Once | CUTANEOUS | Status: AC
  Filled 2023-04-14: qty 10

## 2023-04-14 MED ORDER — OLANZAPINE 10 MG IM SOLR: 5.0000 mg | Freq: Once | INTRAMUSCULAR | Status: DC | PRN

## 2023-04-14 MED ORDER — ACETAMINOPHEN 325 MG PO TABS
650.0000 mg | ORAL_TABLET | Freq: Once | ORAL | Status: AC
Start: 2023-04-14 — End: 2023-04-14
  Administered 2023-04-14: 650 mg via ORAL
  Filled 2023-04-14: qty 2

## 2023-04-14 NOTE — Progress Notes (Signed)
LCSW Progress Note  [id number]   Maria Fox  04/14/2023  2:35 PM  Description:   Inpatient Psychiatric Referral  Patient was recommended inpatient per Benard Halsted NP There are no available beds at [facility], per [facility] Rona Ravens RN). Patient was referred to the following out of network facilities:   Destination  Service Provider Address Phone Fax  [facility] Patient Placement [facility], [place] [phone]  [facility] [address] [place] [address] [phone]  [facility] [address] [place] [address] [phone]  [facility] [address] [place] [address] [phone]  [facility] [address] [place], [place] [address] [phone]  [facility] [address] [place] [address] [phone]  [facility] [address], [place] [address] [phone]  [facility] [address] [place] [address] [phone]  [facility] [address] [place] [address] [phone]  [facility] [address] [place] [address] [phone]  [facility] EFAX [address] [place], [place] [phone]  [facility] [address] [place], [place] [address] [phone]  [facility] [address] [place] [address] [phone]  [facility] [address] [place] [address] [phone]  [facility] [address] [place] [address] [phone]      Situation ongoing, CSW to continue following and update chart  as more information becomes available.      [place] Nealy Hickmon, MSW, LCSW  04/14/2023 2:35 PM

## 2023-04-14 NOTE — ED Notes (Signed)
Patient woke to eat a little. She was not happy with her breakfast option didn't want sausage and wanted bacon and raisin bran. Instead she took crackers and peanut butter since it was past breakfast time. This nurse will gather her morning meds. Patient had harmful thoughts while sleeping otherwise patient states no thoughts at this moment.

## 2023-04-14 NOTE — ED Provider Notes (Signed)
Emergency Medicine Observation Re-evaluation Note  Maria Fox is a 78 y.o. female, seen on rounds today.  Pt initially presented to the ED for complaints of Follow-up Currently, the patient is awaiting psychiatric placement.  Physical Exam  BP 139/67   Pulse 78   Temp 97.6 F (36.4 C)   Resp 16   Wt 59 kg   SpO2 98%   BMI 23.03 kg/m  Physical Exam Resting without problems  ED Course / MDM  EKG:EKG Interpretation Date/Time:  Saturday April 12 2023 14:44:08 EST Ventricular Rate:  125 PR Interval:  120 QRS Duration:  131 QT Interval:  337 QTC Calculation: 486 R Axis:   -73  Text Interpretation: Sinus tachycardia Right bundle branch block LVH with IVCD and secondary repol abnrm Borderline prolonged QT interval Confirmed by Ross Marcus ([id number]) on 04/13/2023 5:00:03 PM  I have reviewed the labs performed to date as well as medications administered while in observation.  Recent changes in the last 24 hours include none.  Plan  Current plan is for psychiatric placement.    Bethann Berkshire, MD 04/14/23 [phone]

## 2023-04-14 NOTE — Progress Notes (Signed)
 Marland Kitchen

## 2023-04-14 NOTE — ED Notes (Signed)
[facility] called pts brother Retta Pitcher) for collateral. Pts brother is th person who brought pt to the [facility] ED. Mr. Delconte reports that pt was having some medical issues such as bladder issues, high sugar, and not walking very well. Per pts brother pt has a hard time remembering things and continues to hear a small man talk in her ear.   Pts brother believes that pt needs to be in along term care facility. [facility] asked pts brother if he has been researching possible long term care facilities for pt as a provider had dicussed with him during a past visit to the [facility]. Mr. Borgen said that he was leaving it up to pts significant other [name]) to find a facility for pt.   Jacquelynn Cree, [facility]  04/14/23

## 2023-04-14 NOTE — Progress Notes (Signed)
LCSW Progress Note  [id number]   Maria Fox  04/14/2023  11:36 PM    Inpatient Behavioral Health Placement  Pt meets inpatient criteria per [name], PMHNP. There are no available beds [facility] per [facility] Rona Ravens, RN. Referral was sent to the following facilities;   Destination  Service Provider Address Phone Fax  [facility] Patient Placement [facility], [place] [phone]  [facility] [address] [place] [address] [phone]  [facility] [address] [place] [address] [phone]  [facility] [address] [place] [address] [phone]  [facility] [address] [place], [place] [address] [phone]  [facility] [address] [place] [address] [phone]  [facility] [address], [place] [address] [phone]  [facility] [address], [place] [address] [phone]  [facility] [address], [place] [address] [phone]  [facility] [address] [place] [address] [phone]  [facility] EFAX [address] [place], [place] [phone]  [facility] [address] [place], [place] [address] [phone]  [facility] [address] [place] [address] [phone]  [facility] [address] [place], [place] [address] [phone]  [facility] [place] [address] [phone]  [facility] [address]  [place], [place] [address] [phone]  [facility] [address], [place] [address] [phone]  [facility] [address], [place] [address] [phone]  [facility] [address] [place] [address] [phone]  [facility] [address], [place] [address] [phone]  [facility] [address], [place] [address] [phone]  [facility] [place] [phone]  [facility] [address] [place] [address] [phone]  [facility] [address], [place] [address] [phone]  [facility] [address], [place] [address] [phone]  [facility] [address] [place] [address] [phone]  [facility] [address], [place] [address] [phone]  [facility]patient [facility] [place] [phone]  [facility] [address] [place], [place] [address] [phone]  [facility] [address] [place] [address] [phone]  [facility] [address] [place] [address] [phone]  [facility] [address] [place] [address] [phone]    Situation ongoing,  CSW will follow up.    Maryjean Ka, MSW, [facility] 04/14/2023 11:36 PM

## 2023-04-14 NOTE — ED Notes (Signed)
Patient sleeping at this time. Even and unlabored respirations noted.

## 2023-04-14 NOTE — Progress Notes (Addendum)
Hypoglycemic Event  CBG: 60  Treatment: 4 oz juice/soda  Symptoms: None  Follow-up CBG: Time:   1710  CBG Result: 80  Possible Reasons for Event: Inadequate meal intake and Medication regimen:    Comments/MD notified: Kommor    Maria Fox
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
## 2023-04-15 LAB — CBG MONITORING, ED
Glucose-Capillary: 162 mg/dL — ABNORMAL HIGH (ref 70–99)
Glucose-Capillary: 113 mg/dL — ABNORMAL HIGH (ref 70–99)
Glucose-Capillary: 110 mg/dL — ABNORMAL HIGH (ref 70–99)

## 2023-04-15 MED ORDER — ACETAMINOPHEN 325 MG PO TABS
650.0000 mg | ORAL_TABLET | Freq: Four times a day (QID) | ORAL | Status: DC | PRN
  Administered 2023-04-15 – 2023-04-21 (×11): 650 mg via ORAL
  Filled 2023-04-15 (×13): qty 2

## 2023-04-15 MED ORDER — LATANOPROST 0.005 % OP SOLN
1.0000 [drp] | Freq: Every day | OPHTHALMIC | Status: DC
  Administered 2023-04-15 – 2023-04-20 (×6): 1 [drp] via OPHTHALMIC
  Filled 2023-04-15: qty 2.5

## 2023-04-15 NOTE — Progress Notes (Signed)
 LCSW Progress Note  [id number]   Maria Fox  04/15/2023  12:28 PM  Description:   Inpatient Psychiatric Referral  Patient was recommended inpatient per [facility], PMHNP  There are no available beds at [facility], per [facility] [name] RN Patient was referred to the following out of network facilities:    Destination  Service Provider Address Phone Fax  [facility], [place] [phone]  [facility] [address] [place] [address] [phone]  [facility] [address], [place] [address] [phone]  [facility] Center-Adult [address] [place] [address] [phone]  [facility] [address] [place], [place] [address] [phone]  [facility] [address] [place] [address] [phone]  [name] [address], [place] [address] [phone]  [facility] [address], [place] [address] [phone]  [facility] [address], [place] [address] [phone]  [facility] [address] [place] [address] [phone]  [facility] EFAX [address] [place], [place] [phone]  [facility] [address] [place], [place] [address] [phone]  [facility] [address] [place] [address] [phone]  [facility] [address] [place], [place] [address] [phone]  [facility] [place] [address] [phone]  [facility] [address]  [place], [place] [address] [phone]  [facility] [address] [place] [address] [phone]  [facility] [address], [place] [address] [phone]  [facility] [address] [place] [address] [phone]  [facility] [address], [place] [address] [phone]  [facility] [address], [place] [address] [phone]  [facility] [place] [phone]  [facility] [address] [place] [address] [phone]  [facility] [address] [place] [address] [phone]  [facility] [place] [address], [place] [address] [phone]  [facility] [address] [place] [address] [phone]  [facility] [address] [place] [address] [phone]  [facility] [place] [phone]  [facility] [address] [place], [place] [address] [phone]  [facility] [address] [place] [address] [phone]  [facility] [address], [place] [address] [phone]  [facility] 1 medical Center [place] [address] [phone]      Situation ongoing, CSW to continue following and update chart as more information becomes available.      Maria Fox, MSW, LCSW  04/15/2023 12:28 PM

## 2023-04-15 NOTE — Transitions of Care (Post Inpatient/ED Visit) (Signed)
   04/15/2023  Name: Maria Fox MRN: [id number] DOB: [birth year]  Today's TOC FU Call Status: Today's TOC FU Call Status:: Unsuccessful Call (3rd Attempt) Unsuccessful Call (1st Attempt) Date: 04/09/23 Unsuccessful Call (2nd Attempt) Date: 04/15/23 Unsuccessful Call (3rd Attempt) Date: 04/15/23  Attempted to reach the patient regarding the most recent Inpatient/ED visit.  Follow Up Plan: No further outreach attempts will be made at this time. We have been unable to contact the patient.  Signature Julian Lemmings, LPN [facility] Nurse Health Advisor Direct Dial [phone]

## 2023-04-15 NOTE — Transitions of Care (Post Inpatient/ED Visit) (Signed)
   04/15/2023  Name: LAIRA PENNINGER MRN: [id number] DOB: [birth year]  Today's TOC FU Call Status: Today's TOC FU Call Status:: Unsuccessful Call (2nd Attempt) Unsuccessful Call (1st Attempt) Date: 04/09/23 Unsuccessful Call (2nd Attempt) Date: 04/15/23  Attempted to reach the patient regarding the most recent Inpatient/ED visit.  Follow Up Plan: Additional outreach attempts will be made to reach the patient to complete the Transitions of Care (Post Inpatient/ED visit) call.   Signature Julian Lemmings, LPN [facility] Nurse Health Advisor Direct Dial [phone]

## 2023-04-15 NOTE — ED Notes (Signed)
[facility] called pts significant other [name]) for collateral and to inquire if he has been researching a possible nursing facility for placement. [facility] left a message to return the call.   Jacquelynn Cree, [facility]   04/15/23

## 2023-04-15 NOTE — ED Provider Notes (Signed)
 Emergency Medicine Observation Re-evaluation Note  Maria Fox is a 78 y.o. female, seen on rounds today.  Pt initially presented to the ED for complaints of Follow-up Currently, the patient is sleeping.  Physical Exam  BP 114/62 (BP Location: Right Arm)   Pulse 78   Temp 97.6 F (36.4 C) (Oral)   Resp 16   Wt 59 kg   SpO2 98%   BMI 23.03 kg/m  Physical Exam General: Asleep Cardiac: regular rate Lungs: equal chest rise Psych: not assessed   ED Course / MDM  EKG:EKG Interpretation Date/Time:  Saturday April 12 2023 14:44:08 EST Ventricular Rate:  125 PR Interval:  120 QRS Duration:  131 QT Interval:  337 QTC Calculation: 486 R Axis:   -73  Text Interpretation: Sinus tachycardia Right bundle branch block LVH with IVCD and secondary repol abnrm Borderline prolonged QT interval Confirmed by Bari Pfeiffer ([id number]) on 04/13/2023 5:00:03 PM  I have reviewed the labs performed to date as well as medications administered while in observation.  Recent changes in the last 24 hours include none.  Plan  Current plan is for psychiatric placement.    Francesca Elsie LITTIE, MD 04/15/23 [phone]

## 2023-04-15 NOTE — Inpatient Diabetes Management (Signed)
Inpatient Diabetes Program Recommendations  AACE/ADA: New Consensus Statement on Inpatient Glycemic Control   Target Ranges:  Prepandial:   less than 140 mg/dL      Peak postprandial:   less than 180 mg/dL (1-2 hours)      Critically ill patients:  140 - 180 mg/dL    Latest Reference Range & Units 04/15/23 08:30  Glucose-Capillary 70 - 99 mg/dL 161 (H)  Tradjenta 5 mg @8 :44    Latest Reference Range & Units 04/14/23 08:22 04/14/23 11:02 04/14/23 13:00 04/14/23 16:44 04/14/23 17:13 04/14/23 20:24  Glucose-Capillary 70 - 99 mg/dL 096 (H)    Novolog 5 units (given at 11:02 for CBG 160 obtained at 8:22am)   Tradjenta 5 mg 221 (H)   Novolog 6 units @13 :46 60 (L) 80 152 (H)    Review of Glycemic Control  Diabetes history: DM2 Outpatient Diabetes medications: Glipizide 10 mg BID, Tradjenta 5 mg daily, Fortmet 500 mg BID, Lantus 20 units daily (not taking) Current orders for Inpatient glycemic control: Novolog 0-9 units TID with meals, Novolog 3 units TID with meals, Tradjenta 5 mg daily  Inpatient Diabetes Program Recommendations:    Insulin: Noted CBG 60 mg/dl at 04:54 on 0/9/81. Anticipate hypoglycemia due to getting 8am dose of Novolog at 11:02 (for CBGs of 160 obtained at 8:22am) and then getting 12pm dose of Novolog 6 units at 13:46. Doses of Novolog likely stacked and caused low glucose. NURSING: Please administer insulin within 60 minutes of when CBG obtained.  Thanks, Orlando Penner, RN, MSN, CDCES Diabetes Coordinator Inpatient Diabetes Program [phone] (Team Pager from 8am to 5pm)

## 2023-04-16 DIAGNOSIS — F331 Major depressive disorder, recurrent, moderate: Secondary | ICD-10-CM | POA: Diagnosis not present

## 2023-04-16 LAB — CBG MONITORING, ED
Glucose-Capillary: 165 mg/dL — ABNORMAL HIGH (ref 70–99)
Glucose-Capillary: 189 mg/dL — ABNORMAL HIGH (ref 70–99)
Glucose-Capillary: 151 mg/dL — ABNORMAL HIGH (ref 70–99)
Glucose-Capillary: 180 mg/dL — ABNORMAL HIGH (ref 70–99)

## 2023-04-16 NOTE — ED Notes (Signed)
 [facility] called pts significant other again for collateral information. Pts SO has not responded yet to messages left by [facility].  Patt Boozer, [facility]  04/16/23

## 2023-04-16 NOTE — ED Provider Notes (Signed)
 Emergency Medicine Observation Re-evaluation Note  Maria Fox is a 78 y.o. female, seen on rounds today.  Pt initially presented to the ED for complaints of Follow-up Currently, the patient is asleep.  Physical Exam  BP (!) 147/70 (BP Location: Right Arm)   Pulse 75   Temp 97.8 F (36.6 C) (Oral)   Resp 17   Wt 59 kg   SpO2 98%   BMI 23.03 kg/m  Physical Exam General: asleep Cardiac: asleep Lungs: asleep Psych: asleep  ED Course / MDM  EKG:EKG Interpretation Date/Time:  Saturday April 12 2023 14:44:08 EST Ventricular Rate:  125 PR Interval:  120 QRS Duration:  131 QT Interval:  337 QTC Calculation: 486 R Axis:   -73  Text Interpretation: Sinus tachycardia Right bundle branch block LVH with IVCD and secondary repol abnrm Borderline prolonged QT interval Confirmed by Bari Pfeiffer ([id number]) on 04/13/2023 5:00:03 PM  I have reviewed the labs performed to date as well as medications administered while in observation.  Recent changes in the last 24 hours include medication adjustments.  Plan  Current plan is for psychiatric inpatient placement.    Freddi Hamilton, MD 04/16/23 [phone]

## 2023-04-16 NOTE — ED Notes (Signed)
 [facility] called pts significant other again for collateral and to inquire if he has researched any potential nursing home placements for pt. Left a message to return the call.   Patt Boozer, [facility]  04/16/23
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
## 2023-04-17 LAB — CBG MONITORING, ED
Glucose-Capillary: 243 mg/dL — ABNORMAL HIGH (ref 70–99)
Glucose-Capillary: 229 mg/dL — ABNORMAL HIGH (ref 70–99)
Glucose-Capillary: 135 mg/dL — ABNORMAL HIGH (ref 70–99)

## 2023-04-17 MED ORDER — RISPERIDONE 0.5 MG PO TBDP
1.0000 mg | ORAL_TABLET | Freq: Two times a day (BID) | ORAL | Status: DC
Start: 2023-04-17 — End: 2023-04-21
  Administered 2023-04-17 – 2023-04-21 (×8): 1 mg via ORAL
  Filled 2023-04-17 (×8): qty 2

## 2023-04-17 NOTE — Progress Notes (Signed)
 LCSW Progress Note  [id number]   Maria Fox  04/17/2023  9:11 AM  Description:   Inpatient Psychiatric Referral  Patient was recommended inpatient per Jadeka Motley-Mangrum, PMHNP ). There are no available beds at [facility], per [facility] Bretta Qua RN. Patient was referred to the following out of network facilities:    Destination   Service Provider Address Phone Fax  [facility] Patient Placement [facility] [place] [phone]  [facility] [address] [place] [address] [phone]  [facility] [address], [place] [address] [phone]  [facility] Center-Adult [address] [place] [address] [phone]  [facility] [address] [place], [place] [address] [phone]  [facility] [address] [place] [address] [phone]  [facility] [address] [place] [address] [phone]  [facility] [address] [place] [address] [phone]  [facility] [address], [place] [address] [phone]  [facility] [address], [place] [address] [phone]  [facility] EFAX [address] [place], [place] [phone]  [facility] [address] [place], [place] [address] [phone]  [facility] [address] [place] [address] [phone]  [facility] [address], [place] [address] [phone]  [facility] [place] [address] [phone]  [facility] [place] [address] [place], [place] [address] [phone]  [facility] [address] [place] [address] [phone]  [facility] [address] [place] [address] [phone]  [facility] [address] [place] [address] [phone]  [facility] [address] [place] [address] [phone]  [facility] [address] [place] [address] [phone]  [facility] [place] [phone]  [facility] [address] [place] [address] [phone]  [facility] [address] [place] [address] [phone]  [facility] [address] [place] [address] [phone]  [facility] [address], [place] [address] [phone]  [facility] [address], [place] [address] [phone]  [facility] [place] [phone]  [facility] [address] [place], [place] [address] [phone]  [facility] [address] [place] [address] [phone]  [facility] [address], [place] [address] [phone]  [facility] medical [facility] [place] [address] [phone]      Situation ongoing, CSW to continue following and update chart as more information becomes available.     Tunisia Shatora Weatherbee, MSW, LCSW  04/17/2023 9:11 AM

## 2023-04-17 NOTE — ED Provider Notes (Signed)
 Emergency Medicine Observation Re-evaluation Note  Maria Fox is a 78 y.o. female, seen on rounds today.  Pt initially presented to the ED for complaints of Follow-up Currently, the patient is sleeping.  Physical Exam  BP (!) 121/55 (BP Location: Right Arm)   Pulse 78   Temp (!) 97.4 F (36.3 C) (Oral)   Resp 18   Wt 59 kg   SpO2 99%   BMI 23.03 kg/m  Physical Exam General: No acute distress Cardiac: Regular rate Lungs: Clear Psych: Calm and cooperative  ED Course / MDM  EKG:EKG Interpretation Date/Time:  Saturday April 12 2023 14:44:08 EST Ventricular Rate:  125 PR Interval:  120 QRS Duration:  131 QT Interval:  337 QTC Calculation: 486 R Axis:   -73  Text Interpretation: Sinus tachycardia Right bundle branch block LVH with IVCD and secondary repol abnrm Borderline prolonged QT interval Confirmed by Bari Pfeiffer ([id number]) on 04/13/2023 5:00:03 PM  I have reviewed the labs performed to date as well as medications administered while in observation.  Recent changes in the last 24 hours include currently recommending inpatient placement.  Plan  Current plan is for waiting for inpatient psychiatric care.    Doretha Folks, MD 04/17/23 1114

## 2023-04-17 NOTE — ED Notes (Signed)
 Patient calm and cooperative at this time. Ambulatory with walker back and forth to bathroom.

## 2023-04-17 NOTE — ED Notes (Signed)
 [facility] called pts significant other Mee Spillers) for collateral information. Left a message to return the call. Pts SO has yet to respond to previous messages.   Patt Boozer, [facility]  04/17/23

## 2023-04-18 DIAGNOSIS — F331 Major depressive disorder, recurrent, moderate: Secondary | ICD-10-CM | POA: Diagnosis not present

## 2023-04-18 DIAGNOSIS — F323 Major depressive disorder, single episode, severe with psychotic features: Secondary | ICD-10-CM | POA: Insufficient documentation

## 2023-04-18 LAB — CBG MONITORING, ED
Glucose-Capillary: 198 mg/dL — ABNORMAL HIGH (ref 70–99)
Glucose-Capillary: 185 mg/dL — ABNORMAL HIGH (ref 70–99)
Glucose-Capillary: 235 mg/dL — ABNORMAL HIGH (ref 70–99)
Glucose-Capillary: 104 mg/dL — ABNORMAL HIGH (ref 70–99)

## 2023-04-18 LAB — SARS CORONAVIRUS 2 BY RT PCR: SARS Coronavirus 2 by RT PCR: NEGATIVE
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
## 2023-04-18 NOTE — ED Notes (Signed)
 [facility] called pts significant other to discuss pts disposition and the possibility of placement into a nursing facility after discharge. Pts so spoke with pts brother and they agree that pt would do well in a long term care placement will be researching nursing facilities in the area. Pts so agreed to keep [facility] informed on the progress towards securing a placement and [facility] agreed to update pts so on pts possible discharge.   Chesley Holt, [facility]  04/18/23

## 2023-04-18 NOTE — ED Provider Notes (Signed)
 Emergency Medicine Observation Re-evaluation Note  Maria Fox is a 78 y.o. female, seen on rounds today.  Pt initially presented to the ED for complaints of Follow-up Currently, the patient is resting comfortably in bed.  Physical Exam  BP 137/62 (BP Location: Right Arm)   Pulse 68   Temp (!) 97.5 F (36.4 C) (Oral)   Resp 18   Wt 59 kg   SpO2 94%   BMI 23.03 kg/m  Physical Exam General: Resting comfortably Lungs: Normal respiratory effort Psych: Calm, cooperative  ED Course / MDM  EKG:EKG Interpretation Date/Time:  Saturday April 12 2023 14:44:08 EST Ventricular Rate:  125 PR Interval:  120 QRS Duration:  131 QT Interval:  337 QTC Calculation: 486 R Axis:   -73  Text Interpretation: Sinus tachycardia Right bundle branch block LVH with IVCD and secondary repol abnrm Borderline prolonged QT interval Confirmed by Maria Fox ([id number]) on 04/13/2023 5:00:03 PM  I have reviewed the labs performed to date as well as medications administered while in observation.  Recent changes in the last 24 hours include none.  Plan  Current plan is for psychiatric admission.    Maria [name], MD 04/18/23 [phone]

## 2023-04-18 NOTE — Progress Notes (Signed)
 Patient has been denied by [facility] due to no appropriate beds available. Patient meets BH inpatient criteria per Cathaleen Adam, NP. Patient has been faxed out to the following facilities:   [facility] Patient [facility], [place] [phone]  [facility] [address] [place] [address] [phone]  [facility] [address] [place] [address] [phone]  [facility]-Adult [address] [place] [address] [phone]  [facility] [address] [place], [place] [address] [phone]  [facility] [address] [place] [address] [phone]  [facility] [address] [place] [address] [phone]  [facility] [address], [place] [address] [phone]  [facility] [address] [place] [address] [phone]  [facility] [address] [place] [address] [phone]  [facility] EFAX [address] [place], [place] [phone]  [facility] [address] [place], [place] [address] [phone]  [facility] [address] [place] [address] [phone]  [facility] [address] [place], [place] [address] [phone]  [facility] [place]. [place] [address] [phone]  [facility] [address] [place], [place] [address] [phone]  [facility] [address] [place] [address] [phone]  [facility] [address], [place] [address] [phone]  [facility] [address] [place] [address] [phone]  [facility] [address], [place] [address] [phone]  [facility] [address] [place] [address] [phone]  [facility] BED Management Behavioral Health [place] [phone]  [facility] [address] [place] [address] [phone]  [facility] [address] [place] [address] [phone]  [facility] [address] [place] [address] [phone]  [facility] [address], [place] [address] [phone]  [facility] [address] [place] [address] [phone]  [facility] Psychiatry Inpatient [facility] [place] [phone]  [facility] [address] [place], [place] [address] [phone]  [facility] [address], [place] [address] [phone]  [facility] [address] [place] [address] [phone]  [facility] Health 1 medical [place] [address] [phone]   Bunnie Gallop, MSW, LCSW-A  6:23 PM 04/18/2023

## 2023-04-19 DIAGNOSIS — I130 Hypertensive heart and chronic kidney disease with heart failure and stage 1 through stage 4 chronic kidney disease, or unspecified chronic kidney disease: Secondary | ICD-10-CM | POA: Diagnosis not present

## 2023-04-19 DIAGNOSIS — Z20822 Contact with and (suspected) exposure to covid-19: Secondary | ICD-10-CM | POA: Diagnosis not present

## 2023-04-19 DIAGNOSIS — Z7984 Long term (current) use of oral hypoglycemic drugs: Secondary | ICD-10-CM | POA: Diagnosis not present

## 2023-04-19 DIAGNOSIS — Z794 Long term (current) use of insulin: Secondary | ICD-10-CM | POA: Diagnosis not present

## 2023-04-19 DIAGNOSIS — I2510 Atherosclerotic heart disease of native coronary artery without angina pectoris: Secondary | ICD-10-CM | POA: Diagnosis not present

## 2023-04-19 DIAGNOSIS — I509 Heart failure, unspecified: Secondary | ICD-10-CM | POA: Diagnosis not present

## 2023-04-19 DIAGNOSIS — E1165 Type 2 diabetes mellitus with hyperglycemia: Secondary | ICD-10-CM | POA: Diagnosis not present

## 2023-04-19 DIAGNOSIS — N1832 Chronic kidney disease, stage 3b: Secondary | ICD-10-CM | POA: Diagnosis not present

## 2023-04-19 DIAGNOSIS — Z7901 Long term (current) use of anticoagulants: Secondary | ICD-10-CM | POA: Diagnosis not present

## 2023-04-19 DIAGNOSIS — Z79899 Other long term (current) drug therapy: Secondary | ICD-10-CM | POA: Diagnosis not present

## 2023-04-19 LAB — CBG MONITORING, ED
Glucose-Capillary: 276 mg/dL — ABNORMAL HIGH (ref 70–99)
Glucose-Capillary: 418 mg/dL — ABNORMAL HIGH (ref 70–99)
Glucose-Capillary: 88 mg/dL (ref 70–99)

## 2023-04-19 NOTE — ED Provider Notes (Signed)
 Emergency Medicine Observation Re-evaluation Note  Maria Fox is a 78 y.o. female, seen on rounds today.  Pt initially presented to the ED for complaints of hallucinations Currently, the patient is asleep.  Pt presented to the ED on 1/17 and was medically cleared.  She continues to meet inpatient criteria for psych and is awaiting a bed.  Covid test from yesterday was negative.  No problems overnight per nursing staff.  Physical Exam  BP (!) 128/59 (BP Location: Right Arm)   Pulse 68   Temp (!) 97.3 F (36.3 C) (Axillary)   Resp 15   Wt 59 kg   SpO2 95%   BMI 23.03 kg/m  Physical Exam General: asleep Cardiac: rr Lungs: clear Psych: asleep  ED Course / MDM  EKG:EKG Interpretation Date/Time:  Saturday April 12 2023 14:44:08 EST Ventricular Rate:  125 PR Interval:  120 QRS Duration:  131 QT Interval:  337 QTC Calculation: 486 R Axis:   -73  Text Interpretation: Sinus tachycardia Right bundle branch block LVH with IVCD and secondary repol abnrm Borderline prolonged QT interval Confirmed by Bari Pfeiffer ([id number]) on 04/13/2023 5:00:03 PM  I have reviewed the labs performed to date as well as medications administered while in observation.  Recent changes in the last 24 hours include none.  Plan  Current plan is for inpatient geropsych.    Dean Clarity, MD 04/19/23 [phone]

## 2023-04-19 NOTE — Progress Notes (Signed)
 LCSW Progress Note  [id number]   IVELISE CASTILLO  04/19/2023  1:29 PM  Description:   Inpatient Psychiatric Referral  Patient was recommended inpatient per Efrain Patient NP There are [facility] beds [facility] per [facility] Bretta Qua RN Patient  was referred to the following out of network facilities:    Destination  Service Provider Request Status Services Address Phone Fax Patient Preferred  [facility] Patient Placement Pending - Request Sent -- [facility] [employer], [place] [phone] --  [facility] Pending - Request Sent -- [address] [place] [address] [phone] --  [facility] Pending - Request Sent -- [address] [place] [address] [phone] --  [facility] Pending - Request Sent -- [address] [place] [address] [phone] --  [facility] Pending - Request Sent -- [address] [place], [place] [address] [phone] --  [facility] Pending - Request Sent -- [address] [place] [address] [phone] --  [facility] Pending - Request Sent -- [address] [place] [address] [phone] --  [facility] Pending - Request Sent -- [address] [place] [address] [phone] --  [facility] Pending - Request Sent -- [address] [place] [address] [phone] --  [facility] Pending - Request Sent -- [address], [place] [address] [phone] --  [facility] Pending - Request Sent -- [address] [place] [phone] --  [facility] Pending - Request Sent -- [address] [place], [place] [address]  [phone] --  [facility]  Medical Center-Geriatric Pending - Request Sent -- [address] [place] [address] [phone] --  [facility] Pending - Request Sent -- [address], [place] [address] [phone] --  [facility] [place] Pending - Request [address]rel [address] [address] [phone] --  [facility] Pending - Request Sent -- [address] [place], [place] [address] [phone] --  [facility] Women's [facility]al Health Unit Pending - Request Sent -- [address], [place] [address] [phone] --  [facility] Pending - Request Sent -- [address] [place] [address] [phone] --  [facility] Pending - Request Sent -- [address], [place] [address] [phone] --  [facility] Pending - Request Sent -- [address] [place] [address] [phone] --  [facility] Pending - Request Sent -- [address], [place] [address] [phone] --  [facility] BED Management Behavioral Health Pending - Request Sent -- [place] [phone] --  [facility] Pending - Request Sent -- [address] [place] [address] [phone] --  [facility] [place] Pending - Request Sent -- [address], [place] [address] [phone] --  [facility] Pending - Request Sent -- [address] [place] [address] [phone] --  [facility] Pending - Request Sent -- [address] [place] [address] [phone] --  [facility] Pending - Request Sent -- [address], [place] [address]  [phone] --  [facility] Psychiatry Inpatient [facility] Pending - Request Sent -- [place] [phone] --  [facility]  Center Pending - Request Sent -- [address] [place], [place] [address] [phone] --  [facility] Pending - Request Sent -- [address], [place] [address] [phone] --  [facility] Pending - Request Sent -- [address] [place] [address] [phone] --  [facility] Pending - Request Sent -- 1 medical Center [place] [address] [phone] --      Situation ongoing, CSW to continue following and update chart as more information becomes available.      Tunisia Gasper Hopes, MSW, LCSW  04/19/2023 1:29 PM

## 2023-04-20 DIAGNOSIS — Z7901 Long term (current) use of anticoagulants: Secondary | ICD-10-CM | POA: Diagnosis not present

## 2023-04-20 DIAGNOSIS — I130 Hypertensive heart and chronic kidney disease with heart failure and stage 1 through stage 4 chronic kidney disease, or unspecified chronic kidney disease: Secondary | ICD-10-CM | POA: Diagnosis not present

## 2023-04-20 DIAGNOSIS — Z20822 Contact with and (suspected) exposure to covid-19: Secondary | ICD-10-CM | POA: Diagnosis not present

## 2023-04-20 DIAGNOSIS — Z79899 Other long term (current) drug therapy: Secondary | ICD-10-CM | POA: Diagnosis not present

## 2023-04-20 DIAGNOSIS — Z7984 Long term (current) use of oral hypoglycemic drugs: Secondary | ICD-10-CM | POA: Diagnosis not present

## 2023-04-20 DIAGNOSIS — F331 Major depressive disorder, recurrent, moderate: Secondary | ICD-10-CM | POA: Diagnosis not present

## 2023-04-20 DIAGNOSIS — I2510 Atherosclerotic heart disease of native coronary artery without angina pectoris: Secondary | ICD-10-CM | POA: Diagnosis not present

## 2023-04-20 DIAGNOSIS — E1165 Type 2 diabetes mellitus with hyperglycemia: Secondary | ICD-10-CM | POA: Diagnosis not present

## 2023-04-20 DIAGNOSIS — I509 Heart failure, unspecified: Secondary | ICD-10-CM | POA: Diagnosis not present

## 2023-04-20 DIAGNOSIS — N1832 Chronic kidney disease, stage 3b: Secondary | ICD-10-CM | POA: Diagnosis not present

## 2023-04-20 DIAGNOSIS — Z794 Long term (current) use of insulin: Secondary | ICD-10-CM | POA: Diagnosis not present

## 2023-04-20 LAB — CBG MONITORING, ED
Glucose-Capillary: 229 mg/dL — ABNORMAL HIGH (ref 70–99)
Glucose-Capillary: 202 mg/dL — ABNORMAL HIGH (ref 70–99)
Glucose-Capillary: 214 mg/dL — ABNORMAL HIGH (ref 70–99)

## 2023-04-20 NOTE — ED Provider Notes (Signed)
 Emergency Medicine Observation Re-evaluation Note  Maria Fox is a 78 y.o. female, seen on rounds today.  Pt initially presented to the ED for complaints of Follow-up Currently, the patient is calm and cooperative.  Physical Exam  BP (!) 120/57 (BP Location: Left Arm)   Pulse 68   Temp 98.5 F (36.9 C) (Oral)   Resp 18   Wt 59 kg   SpO2 97%   BMI 23.03 kg/m  Physical Exam General: Awake. Alert. No acute distress Cardiac: Regular rate rhythm Lungs: Clear to auscultation bilaterally Psych: Calm and cooperative  ED Course / MDM  EKG:EKG Interpretation Date/Time:  Saturday April 12 2023 14:44:08 EST Ventricular Rate:  125 PR Interval:  120 QRS Duration:  131 QT Interval:  337 QTC Calculation: 486 R Axis:   -73  Text Interpretation: Sinus tachycardia Right bundle branch block LVH with IVCD and secondary repol abnrm Borderline prolonged QT interval Confirmed by Bari Pfeiffer ([id number]) on 04/13/2023 5:00:03 PM  I have reviewed the labs performed to date as well as medications administered while in observation.  Recent changes in the last 24 hours include no acute events.  Plan  Current plan is for inpatient psychiatric admission, as she has completed geriatric cognitive evaluation it is deemed to be appropriate for psychiatric admission.    [name] LABOR, DO 04/20/23 1057

## 2023-04-20 NOTE — Progress Notes (Signed)
 Patient has been denied by [facility] due to no appropriate beds available. Patient meets BH inpatient criteria per Efrain Patient, NP. Patient has been faxed out to the following facilities:   [facility] Patient PlacementPending - Request [facility] [phone]--[facility] - Request [address] [place] [facility] [phone]--[facility]-[place] DunesPending - Request [address] [place] [phone]--RR[facility]Y-I[facility]cp[facility]-AdultPending - Request Sent--[address] [place] [phone]--[facility]-Qnmdbuy [facility] CenterPending - Request [address] [place], [place] [phone]--RRFAY-Qmbz [facility] CenterPending - Request Sent--[address] [place] [phone]--RRFAY-Yjbtnni [facility] CenterPending - Request [address] [place] [phone]--RRFAY-Yphy Point RegionalPending - Request Sent--[address], [place] [phone]--RRFAY-Ynoob [facility] Adult CampusPending - Request Sent--[address] [place] [phone]--RR[facility]-Mjozphy [place] [facility] HealthPending - Request [address] [place] [phone]--[facility] EFAXPending - Request Sent--[address] [place], [place] [phone]--[facility]-[place]Pending - Request Sent--[address] [place] [phone]--RRFAY-Ijcpd [facility]  Medical Center-GeriatricPending - Request Sent--[address] [place] [facility] [phone]--[facility] [facility] Hospital[facility] - Request Sent--[address] [place], [place] [phone]--RRFAY-Jumplf [place] PointPending - Request  [place] [phone]--[facility] RidgePending - Request [address] [place], [place] [phone]--RRFAY-JiczwuYzjouy [place]- [facility] Women's Behavioral Health UnitPending - Request [facility], [place] [phone]--RRFAY-Rnjdujo Plain HospitalPending - Request Sent--[address] [place] [phone]--[facility] - Request Sent--[address] [place] [facility] [phone]--RRFAY-Fjmpj [facility] HealthPending - Request [address] [place] [phone]--RRFAY-Fpddpnw HealthPending - Request [address] [place] [phone]--RRFAY-WNCJWU BED Management Behavioral HealthPending - Request Sent--[phone]--[facility] HospitalPending - Request Sent--[address] [place] [phone]--RRFAY-Ejmizz HospitalPending - Request Sent--[address], [place] [phone]--[facility] - Request Sent--[address] [place] [phone]--RRFAY-Mntjw Medical CenterPending - Request Sent--[address] [place], [place] [phone]--[facility] Behavioral HealthPending - Request [facility] [place], [place] [phone]--RRFAY-LWR Hospitals Psychiatry Inpatient EFAXPending - Request Sent--[phone]--[facility] - Request [address] [place], [place] [phone]--RRFAY-Tjbwz [facility] HealthcarePending - Request [address] [place] [facility] [phone]--RRFAY-Cpijwu Behavioral HealthPending - Request Sent--[address] [place] [place], [place]  [place] [phone]--RRFAY-Tjxz [facility] HealthPending - Request Sent--[facility] [place] [phone]--  Bunnie Gallop, MSW, LCSW-A  11:23 AM 04/20/2023

## 2023-04-20 NOTE — ED Notes (Signed)
 Waiting to do her BGS check    dinner trays arrived early today

## 2023-04-20 NOTE — ED Notes (Signed)
 Patient in bedside chair resting
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
## 2023-04-21 DIAGNOSIS — I130 Hypertensive heart and chronic kidney disease with heart failure and stage 1 through stage 4 chronic kidney disease, or unspecified chronic kidney disease: Secondary | ICD-10-CM | POA: Diagnosis not present

## 2023-04-21 DIAGNOSIS — Z79899 Other long term (current) drug therapy: Secondary | ICD-10-CM | POA: Diagnosis not present

## 2023-04-21 DIAGNOSIS — F323 Major depressive disorder, single episode, severe with psychotic features: Secondary | ICD-10-CM

## 2023-04-21 DIAGNOSIS — I2510 Atherosclerotic heart disease of native coronary artery without angina pectoris: Secondary | ICD-10-CM | POA: Diagnosis not present

## 2023-04-21 DIAGNOSIS — I509 Heart failure, unspecified: Secondary | ICD-10-CM | POA: Diagnosis not present

## 2023-04-21 DIAGNOSIS — Z7901 Long term (current) use of anticoagulants: Secondary | ICD-10-CM | POA: Diagnosis not present

## 2023-04-21 DIAGNOSIS — N1832 Chronic kidney disease, stage 3b: Secondary | ICD-10-CM | POA: Diagnosis not present

## 2023-04-21 DIAGNOSIS — Z794 Long term (current) use of insulin: Secondary | ICD-10-CM | POA: Diagnosis not present

## 2023-04-21 DIAGNOSIS — E1165 Type 2 diabetes mellitus with hyperglycemia: Secondary | ICD-10-CM | POA: Diagnosis not present

## 2023-04-21 DIAGNOSIS — Z20822 Contact with and (suspected) exposure to covid-19: Secondary | ICD-10-CM | POA: Diagnosis not present

## 2023-04-21 DIAGNOSIS — Z7984 Long term (current) use of oral hypoglycemic drugs: Secondary | ICD-10-CM | POA: Diagnosis not present

## 2023-04-21 DIAGNOSIS — F419 Anxiety disorder, unspecified: Secondary | ICD-10-CM

## 2023-04-21 LAB — CBG MONITORING, ED
Glucose-Capillary: 264 mg/dL — ABNORMAL HIGH (ref 70–99)
Glucose-Capillary: 157 mg/dL — ABNORMAL HIGH (ref 70–99)
Glucose-Capillary: 164 mg/dL — ABNORMAL HIGH (ref 70–99)

## 2023-04-21 MED ORDER — RISPERIDONE 1 MG PO TBDP: 1.0000 mg | ORAL_TABLET | Freq: Two times a day (BID) | ORAL | 0 refills | Status: DC

## 2023-04-21 MED ORDER — ACETAMINOPHEN 325 MG PO TABS
650.0000 mg | ORAL_TABLET | Freq: Four times a day (QID) | ORAL | 0 refills | Status: AC | PRN
Start: 2023-04-21 — End: 2023-05-06

## 2023-04-21 NOTE — ED Notes (Signed)
 [facility] called Mee Spillers, pts significant other to inform him that pt will be cleared and ready for discharge. Pts SO did not answer.   [facility] called pts brother to inform him that pt will be cleared and is ready to be discharged to day at 3pm. Provider spoke with pts brother about having pt seen by a neuropsychiatrist to be evaluated for possible dementia. Pts brother agreed to pick pt up at 3pm today.  Patt Boozer, [facility]  04/21/23

## 2023-04-21 NOTE — ED Provider Notes (Addendum)
 Coagulations Physical Exam  BP 109/62 (BP Location: Left Arm)   Pulse 77   Temp 97.7 F (36.5 C) (Oral)   Resp 16   Wt 59 kg   SpO2 99%   BMI 23.03 kg/m   Physical Exam  Procedures  Procedures  ED Course / MDM   Clinical Course as of 04/21/23 0733  Sat Apr 12, 2023  2340 Laboratory testing with hyperglycemia, on repeat BMP after fluid resuscitation and insulin , glucose is improved, kidney function has normalized.  She does have mild hypokalemia which will be repleted.  I do not think there is any indication for patient to be admitted to the hospital, but given her suicidal ideation, will place psych consult, will be boarding for psychiatric evaluation. [WS]    Clinical Course User Index [WS] Mordecai Applebaum, MD   Medical Decision Making Amount and/or Complexity of Data Reviewed Labs: ordered.  Risk OTC drugs. Prescription drug management.   No issues overnight.  Pending Geri psych placement.  Has been in the ER for 221 hours.  Patient has been cleared by psychiatry.  Psychiatry requests adjustment of medications for her hyperglycemia.  Will get diabetes coordinator to help.       Mozell Arias, MD 04/21/23 9629    Mozell Arias, MD 04/21/23 [phone]
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
## 2023-04-25 ENCOUNTER — Ambulatory Visit (INDEPENDENT_AMBULATORY_CARE_PROVIDER_SITE_OTHER): Payer: 59 | Admitting: Family Medicine

## 2023-04-25 ENCOUNTER — Encounter: Payer: Self-pay | Admitting: Family Medicine

## 2023-04-25 VITALS — BP 124/84 | HR 100 | Temp 97.6°F | Ht 63.0 in | Wt 135.4 lb

## 2023-04-25 DIAGNOSIS — I4892 Unspecified atrial flutter: Secondary | ICD-10-CM | POA: Diagnosis not present

## 2023-04-25 DIAGNOSIS — I5032 Chronic diastolic (congestive) heart failure: Secondary | ICD-10-CM

## 2023-04-25 DIAGNOSIS — N1831 Chronic kidney disease, stage 3a: Secondary | ICD-10-CM

## 2023-04-25 DIAGNOSIS — E1169 Type 2 diabetes mellitus with other specified complication: Secondary | ICD-10-CM

## 2023-04-25 DIAGNOSIS — F332 Major depressive disorder, recurrent severe without psychotic features: Secondary | ICD-10-CM

## 2023-04-25 DIAGNOSIS — N390 Urinary tract infection, site not specified: Secondary | ICD-10-CM

## 2023-04-25 DIAGNOSIS — H401130 Primary open-angle glaucoma, bilateral, stage unspecified: Secondary | ICD-10-CM

## 2023-04-25 DIAGNOSIS — F333 Major depressive disorder, recurrent, severe with psychotic symptoms: Secondary | ICD-10-CM

## 2023-04-25 DIAGNOSIS — E1149 Type 2 diabetes mellitus with other diabetic neurological complication: Secondary | ICD-10-CM

## 2023-04-25 DIAGNOSIS — R45851 Suicidal ideations: Secondary | ICD-10-CM

## 2023-04-25 DIAGNOSIS — Z8673 Personal history of transient ischemic attack (TIA), and cerebral infarction without residual deficits: Secondary | ICD-10-CM | POA: Diagnosis not present

## 2023-04-25 DIAGNOSIS — E785 Hyperlipidemia, unspecified: Secondary | ICD-10-CM

## 2023-04-25 DIAGNOSIS — I2510 Atherosclerotic heart disease of native coronary artery without angina pectoris: Secondary | ICD-10-CM

## 2023-04-25 DIAGNOSIS — Z951 Presence of aortocoronary bypass graft: Secondary | ICD-10-CM

## 2023-04-25 DIAGNOSIS — E113293 Type 2 diabetes mellitus with mild nonproliferative diabetic retinopathy without macular edema, bilateral: Secondary | ICD-10-CM

## 2023-04-25 DIAGNOSIS — G6289 Other specified polyneuropathies: Secondary | ICD-10-CM

## 2023-04-25 LAB — BASIC METABOLIC PANEL
BUN: 12 mg/dL (ref 6–23)
CO2: 24 meq/L (ref 19–32)
Calcium: 8.8 mg/dL (ref 8.4–10.5)
Chloride: 103 meq/L (ref 96–112)
Creatinine, Ser: 1.24 mg/dL — ABNORMAL HIGH (ref 0.40–1.20)
GFR: 41.23 mL/min — ABNORMAL LOW (ref >60.00)
Glucose, Bld: 351 mg/dL — ABNORMAL HIGH (ref 70–99)
Potassium: 4.3 meq/L (ref 3.5–5.1)
Sodium: 137 meq/L (ref 135–145)

## 2023-04-25 MED ORDER — METFORMIN HCL ER 500 MG PO TB24: 500.0000 mg | ORAL_TABLET | Freq: Every day | ORAL | 1 refills | Status: DC

## 2023-04-25 NOTE — Patient Instructions (Addendum)
I will place new referrals to urologist and for psychiatrist. I will place referral for social worker to help with placement at assisted living facility.  Restart metformin 500mg  (fortamet) once daily. New dose for metformin XR 500mg  [facility]. If sugars run high, start lantus injection daily.  Return in 2-3 weeks - bring all your medicines to that visit.

## 2023-04-25 NOTE — Progress Notes (Signed)
Ph: [phone] Fax: [phone]   Patient ID: Maria Fox, female    DOB: 07-03-1945, 78 y.o.   MRN: [id number]  This visit was conducted in person.  BP 124/84   Pulse 100   Temp 97.6 F (36.4 C) (Oral)   Ht 5\' 3"  (1.6 m)   Wt 135 lb 6 oz (61.4 kg)   SpO2 98%   BMI 23.98 kg/m    CC: hosp f/u visit  Subjective:   HPI: Maria Fox is a 78 y.o. female presenting on 04/25/2023 for Hospitalization Follow-up (Admitted on 02/24/23 at [facility], dx acute cystitis w/o hematuria; diarrhea; weakness; dehydration; uncontrolled type 2 DM w/ hyperghlycemia. Then seen on 03/13/23 at [facility] ED, dx HA; hyperglycemia. Seen on 04/07/23 at [facility] ED, dx hallucinations; auditory hallucinations;  lower UTI. Seen on 04/12/23 at [facility] ED, dx suicidal ideation; hyperglycemia.)   Her brother [name] drove her here today.   Last seen in office 12/2022 for recurrent UTI s/p hospitalization, had trouble getting in with [facility]. Has still not been seen. UA in ER didn't have signs of infection. She states she is taking keflex 500mg  1 pill daily (was prescribed TID x7d course at ER visit 04/07/2023, UCx at that time grew MBM).  Multiple ER visits and hospitalizations over the past 3 months.  Recent ER visits for hyperglycemia with suicidal ideations, latest 04/12/2023, stayed in ER for 9 days, admitted to geriatric psychiatric unit at behavioral health center. Ongoing auditory hallucinations L>R ears. Denies homicidal ideations.  Hospital records reviewed however I don't have access to behavioral health hospitalization. Limited med rec performed - she did not bring in meds today and is unsure what she is or isn't taking. She states she was taken off metformin.  She states she doesn't have psychiatry follow up.  Currently living with long-term partner [name] of 32 yrs however she thinks he's found someone else.  She desires to move into assisted living but needs help with this. Referral placed to social worker.   Since  home, no fevers/chills, cough, diarrhea, nausea/vomiting, dysuria or abdominal pain, no chest pain.   She continues Risperdal 1mg  twice daily Cymbalta 60mg  daily and trazodone 50mg  nightly.   Continues eliquis for atrial fibrillation/flutter.   DM - she is not taking lantus insulin. She was taken off metformin as well.  She states she recently received glucometer with strips (Accuchek) but she has  not yet started checking sugars.  Reviewing chart, metformin and glipizide were stopped during behavioral health admission 03/17/2023.   Recent UTIs: 02/2022: >100k Klebsiella, 60k E coli treated with ceftriaxone in hospital 03/2022: >100k enterobacter treated with cipro 07/2022: hospitalization for presumed UTI/dehydration although UCx negative - treated with cefepime --> oral keflex 08/2022: Klebsiella and Enterobacter UTI treated with macrobid bid 5d and ciprofloxacin 250mg  bid 5d course - referred to urology 11/2022: Klebsiella UTI s/p hospitalization, treated with cetriaxone -> cefadroxil 02/2023: >100k Klebsiella hospitalized and treated with IV rocephin followed by oral keflex  03/2023: >100k Klebsiella in ER treated with Keflex  Home health not set up.  Other follow up appointments scheduled: none ______________________________________________________________________ Hospital admission: 04/12/2023 Hospital discharge: 04/21/2023 TCM f/u phone call: attempted x3, unsuccessful      Relevant past medical, surgical, family and social history reviewed and updated as indicated. Interim medical history since our last visit reviewed. Allergies and medications reviewed and updated. Outpatient Medications Prior to Visit  Medication Sig Dispense Refill   Accu-Chek Softclix Lancets lancets 1 each  by Other route as directed.     acetaminophen (TYLENOL) 325 MG tablet Take 2 tablets (650 mg total) by mouth every 6 (six) hours as needed for up to 15 days for headache. 25 tablet 0   acyclovir ointment  (ZOVIRAX) 5 % Apply topically every 3 (three) hours. 1 g 0   albuterol (PROVENTIL) (2.5 MG/3ML) 0.083% nebulizer solution Inhale 3 mLs into the lungs every 6 (six) hours as needed for wheezing or shortness of breath. 75 mL 12   apixaban (ELIQUIS) 5 MG TABS tablet Take 1 tablet (5 mg total) by mouth 2 (two) times daily. 60 tablet 5   atorvastatin (LIPITOR) 40 MG tablet TAKE 1 TABLET BY MOUTH EVERY EVENING 90 tablet 0   Blood Glucose Monitoring Suppl DEVI 1 each by Does not apply route in the morning, at noon, and at bedtime. May substitute to any manufacturer covered by patient's insurance. 1 each 0   Blood Glucose Monitoring Suppl DEVI 1 each by Does not apply route in the morning, at noon, and at bedtime. May substitute to any manufacturer covered by patient's insurance. 1 each 0   clopidogrel (PLAVIX) 75 MG tablet Take 1 tablet (75 mg total) by mouth daily. 30 tablet 0   DULoxetine (CYMBALTA) 60 MG capsule Take 1 capsule (60 mg total) by mouth daily. 30 capsule 2   feeding supplement, GLUCERNA SHAKE, (GLUCERNA SHAKE) LIQD Take 237 mLs by mouth 3 (three) times daily between meals. 30 mL 0   gabapentin (NEURONTIN) 300 MG capsule Take 300 mg by mouth 2 (two) times daily.     Glucose Blood (BLOOD GLUCOSE TEST STRIPS) STRP 1 each by In Vitro route in the morning, at noon, and at bedtime. May substitute to any manufacturer covered by patient's insurance. 100 strip 0   hydrocortisone cream 1 % Apply topically 3 (three) times daily. 30 g 0   insulin glargine (LANTUS) 100 UNIT/ML Solostar Pen Inject 20 Units into the skin daily. 15 mL 11   Insulin Pen Needle (PEN NEEDLES) 32G X 6 MM MISC Use 1 pen needle daily to inject insulin. 100 each 0   isosorbide mononitrate (IMDUR) 30 MG 24 hr tablet Take 0.5 tablets (15 mg total) by mouth daily. 30 tablet 0   Lancet Device MISC 1 each by Does not apply route in the morning, at noon, and at bedtime. May substitute to any manufacturer covered by patient's insurance. 1  each 0   Lancets Misc. MISC 1 each by Does not apply route in the morning, at noon, and at bedtime. May substitute to any manufacturer covered by patient's insurance. 100 each 0   latanoprost (XALATAN) 0.005 % ophthalmic solution Place 1 drop into both eyes at bedtime. 2.5 mL 12   linagliptin (TRADJENTA) 5 MG TABS tablet Take 1 tablet (5 mg total) by mouth daily. (Patient taking differently: Take 5 mg by mouth daily. In addition to metformin) 90 tablet 2   metoprolol succinate (TOPROL-XL) 25 MG 24 hr tablet Take 1 tablet (25 mg total) by mouth daily. 30 tablet 0   nitroGLYCERIN (NITROSTAT) 0.4 MG SL tablet Place 0.4 mg under the tongue every 5 (five) minutes as needed for chest pain.     pantoprazole (PROTONIX) 40 MG tablet Take 1 tablet (40 mg total) by mouth daily. (Patient taking differently: Take 40 mg by mouth 2 (two) times daily.) 30 tablet 0   risperiDONE (RISPERDAL M-TABS) 1 MG disintegrating tablet Take 1 tablet (1 mg total) by mouth 2 (two)  times daily. 60 tablet 0   traZODone (DESYREL) 50 MG tablet Take 1 tablet (50 mg total) by mouth at bedtime. 30 tablet 0   glipiZIDE (GLUCOTROL) 10 MG tablet Take 10 mg by mouth 2 (two) times daily before a meal.     Multiple Vitamin (MULTIVITAMIN WITH MINERALS) TABS tablet Take 1 tablet by mouth daily. 30 tablet 0   metformin (FORTAMET) 500 MG (OSM) 24 hr tablet Take 500 mg by mouth in the morning and at bedtime. (Patient not taking: Reported on 04/25/2023)     No facility-administered medications prior to visit.     Per HPI unless specifically indicated in ROS section below Review of Systems  Objective:  BP 124/84   Pulse 100   Temp 97.6 F (36.4 C) (Oral)   Ht 5\' 3"  (1.6 m)   Wt 135 lb 6 oz (61.4 kg)   SpO2 98%   BMI 23.98 kg/m   Wt Readings from Last 3 Encounters:  04/25/23 135 lb 6 oz (61.4 kg)  04/12/23 130 lb (59 kg)  04/07/23 130 lb 1.1 oz (59 kg)      Physical Exam Vitals and nursing note reviewed.  Constitutional:       Appearance: Normal appearance.  HENT:     Mouth/Throat:     Mouth: Mucous membranes are moist.     Pharynx: Oropharynx is clear. No oropharyngeal exudate or posterior oropharyngeal erythema.  Eyes:     Extraocular Movements: Extraocular movements intact.     Pupils: Pupils are equal, round, and reactive to light.  Cardiovascular:     Rate and Rhythm: Normal rate and regular rhythm.     Pulses: Normal pulses.     Heart sounds: Normal heart sounds. No murmur heard. Pulmonary:     Effort: Pulmonary effort is normal. No respiratory distress.     Breath sounds: Normal breath sounds. No wheezing, rhonchi or rales.  Abdominal:     General: Bowel sounds are normal. There is no distension.     Palpations: Abdomen is soft.     Tenderness: There is no abdominal tenderness. There is no guarding or rebound.  Musculoskeletal:     Right lower leg: No edema.     Left lower leg: No edema.  Skin:    General: Skin is warm and dry.     Findings: No rash.     Comments: Vitiligo   Neurological:     Mental Status: She is alert.  Psychiatric:        Mood and Affect: Mood normal.        Behavior: Behavior normal.       Results for orders placed or performed in visit on 04/25/23  Basic metabolic panel   Collection Time: 04/25/23 12:28 PM  Result Value Ref Range   Sodium 137 135 - 145 mEq/L   Potassium 4.3 3.5 - 5.1 mEq/L   Chloride 103 96 - 112 mEq/L   CO2 24 19 - 32 mEq/L   Glucose, Bld 351 (H) 70 - 99 mg/dL   BUN 12 6 - 23 mg/dL   Creatinine, Ser 1.88 (H) 0.40 - 1.20 mg/dL   GFR 41.66 (L) >06.30 mL/min   Calcium 8.8 8.4 - 10.5 mg/dL   Lab Results  Component Value Date   HGBA1C 9.6 (H) 02/27/2023    Assessment & Plan:  I spent 45 minutes caring for this patient today face to face, reviewing labs, prior records from another facility, performing a medically appropriate examination and/or evaluation,  counseling and educating the patient on referrals to urology, psychiatry, social work/case  management/pharmacy, documenting in the record and arranging for follow up.   Problem List Items Addressed This Visit     Chronic kidney disease, stage 3a ([name]) (Chronic)   Update labs today.       Relevant Orders   Basic metabolic panel (Completed)   AMB Referral [facility] Management   Hyperlipidemia associated with type 2 diabetes mellitus ([name])   Continue atorvastatin.       Relevant Medications   metFORMIN (GLUCOPHAGE-XR) 500 MG 24 hr tablet   Type 2 diabetes mellitus with neurological complications ([name])   Chronic, uncontrolled. She states she was taken off fortamet, unclear reason why. She is not taking lantus - she remains hesitant for injections.  She only is taking glipizide 10mg  bid and tradenta 5mg  daily.  Will start metformin XR 500mg  once daily.  Encouraged she try lantus if hyperglycemia continues.       Relevant Medications   metFORMIN (GLUCOPHAGE-XR) 500 MG 24 hr tablet   Other Relevant Orders   AMB Referral [facility] Management   S/P CABG x 3   History of cerebrovascular accident (CVA) in adulthood   Continue plavix, atorvastatin. Also on eliquis       Relevant Orders   AMB Referral [facility] Care Management   Chronic diastolic CHF (congestive heart failure) ([name])   Seems euvolemic.       Relevant Orders   AMB Referral [facility] Care Management   Suicidal ideation   S/p recent prolonged ER visit followed by geripsych stay.  Currently quiescent.  New referral to psychiatry.       CAD, multiple vessel s/p CABG x 3   Continue metoprolol, plavix, imdur, atorvastatin.       Relevant Orders   AMB Referral [facility] Care Management   Type 2 diabetes mellitus with mild nonproliferative diabetic retinopathy without macular edema, bilateral ([name])   Has seen [name] Dr Ashley Royalty, not recently.       Relevant Medications   metFORMIN (GLUCOPHAGE-XR) 500 MG 24 hr tablet   Other Relevant Orders   AMB Referral [facility] Care Management   Paroxysmal  atrial flutter ([name])   Continue eliquis.       Relevant Orders   AMB Referral [facility] Care Management   POAG (primary open-angle glaucoma)   Sees Dr Dione Booze. Continue Xalatan eye drops      Recurrent UTI   Currently symptoms controlled as she's taking cephalexin 500mg  once daily from Rx received from ER 04/07/2023.  New urology referral placed.  Has not seen them for recurrent UTI yet.       Relevant Orders   Ambulatory referral to Urology   Peripheral neuropathy   Continue gabapentin 300mg  bid      MDD (major depressive disorder), recurrent, severe, with psychosis ([name]) - Primary   Continues risperdal and trazodone as well as Cymbalta Referral placed to psychiatry.         Meds ordered this encounter  Medications   metFORMIN (GLUCOPHAGE-XR) 500 MG 24 hr tablet    Sig: Take 1 tablet (500 mg total) by mouth daily with breakfast.    Dispense:  90 tablet    Refill:  1    Note new medicine and dose    Orders Placed This Encounter  Procedures   Basic metabolic panel   AMB Referral [facility] Management    Referral Priority:   Routine    Referral Type:  Consultation    Referral Reason:   Care Coordination    Number of Visits Requested:   1   Ambulatory referral to Psychiatry    Referral Priority:   Routine    Referral Type:   Psychiatric    Referral Reason:   Specialty Services Required    Requested Specialty:   Psychiatry    Number of Visits Requested:   1   Ambulatory referral to Urology    Referral Priority:   Routine    Referral Type:   Consultation    Referral Reason:   Specialty Services Required    Requested Specialty:   Urology    Number of Visits Requested:   1    Patient Instructions  I will place new referrals to urologist and for psychiatrist. I will place referral for social worker to help with placement at assisted living facility.  Restart metformin 500mg  (fortamet) once daily. New dose for metformin XR 500mg  [facility]. If sugars run high,  start lantus injection daily.  Return in 2-3 weeks - bring all your medicines to that visit.  Follow up plan: Return in about 2 weeks (around 05/09/2023), or if symptoms worsen or fail to improve, for follow up visit.  Eustaquio Boyden, MD

## 2023-04-28 ENCOUNTER — Encounter (HOSPITAL_COMMUNITY): Payer: Self-pay

## 2023-04-28 ENCOUNTER — Telehealth: Payer: Self-pay | Admitting: *Deleted

## 2023-04-28 ENCOUNTER — Encounter: Payer: Self-pay | Admitting: *Deleted

## 2023-04-28 ENCOUNTER — Ambulatory Visit: Payer: Self-pay

## 2023-04-28 ENCOUNTER — Telehealth: Payer: Self-pay | Admitting: Family Medicine

## 2023-04-28 ENCOUNTER — Other Ambulatory Visit: Payer: Self-pay

## 2023-04-28 ENCOUNTER — Emergency Department (HOSPITAL_COMMUNITY)
Admission: EM | Admit: 2023-04-28 | Discharge: 2023-04-30 | Disposition: A | Discharge status: Home / Self Care | Payer: 59 | Attending: Emergency Medicine | Admitting: Emergency Medicine

## 2023-04-28 DIAGNOSIS — Z8616 Personal history of COVID-19: Secondary | ICD-10-CM | POA: Insufficient documentation

## 2023-04-28 DIAGNOSIS — Z7901 Long term (current) use of anticoagulants: Secondary | ICD-10-CM | POA: Insufficient documentation

## 2023-04-28 DIAGNOSIS — R45851 Suicidal ideations: Secondary | ICD-10-CM | POA: Diagnosis not present

## 2023-04-28 DIAGNOSIS — R440 Auditory hallucinations: Secondary | ICD-10-CM | POA: Insufficient documentation

## 2023-04-28 DIAGNOSIS — Z794 Long term (current) use of insulin: Secondary | ICD-10-CM | POA: Diagnosis not present

## 2023-04-28 DIAGNOSIS — F32A Depression, unspecified: Secondary | ICD-10-CM | POA: Diagnosis present

## 2023-04-28 DIAGNOSIS — N1832 Chronic kidney disease, stage 3b: Secondary | ICD-10-CM | POA: Insufficient documentation

## 2023-04-28 DIAGNOSIS — Z7902 Long term (current) use of antithrombotics/antiplatelets: Secondary | ICD-10-CM | POA: Diagnosis not present

## 2023-04-28 DIAGNOSIS — I5032 Chronic diastolic (congestive) heart failure: Secondary | ICD-10-CM | POA: Insufficient documentation

## 2023-04-28 DIAGNOSIS — Z79899 Other long term (current) drug therapy: Secondary | ICD-10-CM | POA: Insufficient documentation

## 2023-04-28 DIAGNOSIS — R443 Hallucinations, unspecified: Secondary | ICD-10-CM

## 2023-04-28 DIAGNOSIS — I10 Essential (primary) hypertension: Secondary | ICD-10-CM | POA: Diagnosis not present

## 2023-04-28 DIAGNOSIS — E1122 Type 2 diabetes mellitus with diabetic chronic kidney disease: Secondary | ICD-10-CM | POA: Insufficient documentation

## 2023-04-28 DIAGNOSIS — N3000 Acute cystitis without hematuria: Secondary | ICD-10-CM | POA: Diagnosis not present

## 2023-04-28 DIAGNOSIS — I2510 Atherosclerotic heart disease of native coronary artery without angina pectoris: Secondary | ICD-10-CM | POA: Diagnosis not present

## 2023-04-28 DIAGNOSIS — Z8673 Personal history of transient ischemic attack (TIA), and cerebral infarction without residual deficits: Secondary | ICD-10-CM | POA: Diagnosis not present

## 2023-04-28 DIAGNOSIS — I130 Hypertensive heart and chronic kidney disease with heart failure and stage 1 through stage 4 chronic kidney disease, or unspecified chronic kidney disease: Secondary | ICD-10-CM | POA: Insufficient documentation

## 2023-04-28 DIAGNOSIS — Z7984 Long term (current) use of oral hypoglycemic drugs: Secondary | ICD-10-CM | POA: Insufficient documentation

## 2023-04-28 LAB — COMPREHENSIVE METABOLIC PANEL
ALT: 13 U/L (ref 0–44)
AST: 16 U/L (ref 15–41)
Albumin: 3.8 g/dL (ref 3.5–5.0)
Alkaline Phosphatase: 79 U/L (ref 38–126)
Anion gap: 8 (ref 5–15)
BUN: 13 mg/dL (ref 8–23)
CO2: 20 mmol/L — ABNORMAL LOW (ref 22–32)
Calcium: 8.8 mg/dL — ABNORMAL LOW (ref 8.9–10.3)
Chloride: 105 mmol/L (ref 98–111)
Creatinine, Ser: 1.33 mg/dL — ABNORMAL HIGH (ref 0.44–1.00)
GFR, Estimated: 40 mL/min — ABNORMAL LOW (ref >60)
Glucose, Bld: 346 mg/dL — ABNORMAL HIGH (ref 70–99)
Potassium: 4.2 mmol/L (ref 3.5–5.1)
Sodium: 133 mmol/L — ABNORMAL LOW (ref 135–145)
Total Bilirubin: 0.4 mg/dL (ref 0.0–1.2)
Total Protein: 7.5 g/dL (ref 6.5–8.1)

## 2023-04-28 LAB — CBC WITH DIFFERENTIAL/PLATELET
Abs Immature Granulocytes: 0.02 10*3/uL (ref 0.00–0.07)
Basophils Absolute: 0.1 10*3/uL (ref 0.0–0.1)
Basophils Relative: 1 %
Eosinophils Absolute: 0.2 10*3/uL (ref 0.0–0.5)
Eosinophils Relative: 3 %
HCT: 36 % (ref 36.0–46.0)
Hemoglobin: 11.6 g/dL — ABNORMAL LOW (ref 12.0–15.0)
Immature Granulocytes: 0 %
Lymphocytes Relative: 20 %
Lymphs Abs: 1.5 10*3/uL (ref 0.7–4.0)
MCH: 28.6 pg (ref 26.0–34.0)
MCHC: 32.2 g/dL (ref 30.0–36.0)
MCV: 88.7 fL (ref 80.0–100.0)
Monocytes Absolute: 0.4 10*3/uL (ref 0.1–1.0)
Monocytes Relative: 5 %
Neutro Abs: 5.3 10*3/uL (ref 1.7–7.7)
Neutrophils Relative %: 71 %
Platelets: 236 10*3/uL (ref 150–400)
RBC: 4.06 MIL/uL (ref 3.87–5.11)
RDW: 13.4 % (ref 11.5–15.5)
WBC: 7.4 10*3/uL (ref 4.0–10.5)
nRBC: 0 % (ref 0.0–0.2)

## 2023-04-28 LAB — CBG MONITORING, ED: Glucose-Capillary: 261 mg/dL — ABNORMAL HIGH (ref 70–99)

## 2023-04-28 LAB — ETHANOL: Alcohol, Ethyl (B): <10 mg/dL (ref <10)

## 2023-04-28 LAB — ACETAMINOPHEN LEVEL: Acetaminophen (Tylenol), Serum: <10 ug/mL — ABNORMAL LOW (ref 10–30)

## 2023-04-28 LAB — SALICYLATE LEVEL: Salicylate Lvl: <7 mg/dL — ABNORMAL LOW (ref 7.0–30.0)

## 2023-04-28 MED ORDER — ALBUTEROL SULFATE (2.5 MG/3ML) 0.083% IN NEBU: 3.0000 mL | INHALATION_SOLUTION | Freq: Four times a day (QID) | RESPIRATORY_TRACT | Status: DC | PRN

## 2023-04-28 MED ORDER — GLUCERNA SHAKE PO LIQD
237.0000 mL | Freq: Three times a day (TID) | ORAL | Status: DC
  Administered 2023-04-29 – 2023-04-30 (×4): 237 mL via ORAL
  Filled 2023-04-28 (×7): qty 237

## 2023-04-28 MED ORDER — PANTOPRAZOLE SODIUM 40 MG PO TBEC
40.0000 mg | DELAYED_RELEASE_TABLET | Freq: Two times a day (BID) | ORAL | Status: DC
  Administered 2023-04-28 – 2023-04-30 (×3): 40 mg via ORAL
  Filled 2023-04-28 (×4): qty 1

## 2023-04-28 MED ORDER — METFORMIN HCL ER 500 MG PO TB24
500.0000 mg | ORAL_TABLET | Freq: Two times a day (BID) | ORAL | Status: DC
  Administered 2023-04-29 – 2023-04-30 (×2): 500 mg via ORAL
  Filled 2023-04-28 (×5): qty 1

## 2023-04-28 MED ORDER — GABAPENTIN 300 MG PO CAPS
300.0000 mg | ORAL_CAPSULE | Freq: Two times a day (BID) | ORAL | Status: DC
  Administered 2023-04-28 – 2023-04-30 (×3): 300 mg via ORAL
  Filled 2023-04-28 (×4): qty 1

## 2023-04-28 MED ORDER — ISOSORBIDE MONONITRATE ER 30 MG PO TB24
15.0000 mg | ORAL_TABLET | Freq: Every day | ORAL | Status: DC
Start: 2023-04-29 — End: 2023-04-30
  Administered 2023-04-30: 15 mg via ORAL
  Filled 2023-04-28 (×2): qty 1

## 2023-04-28 MED ORDER — RISPERIDONE 0.5 MG PO TBDP
1.0000 mg | ORAL_TABLET | Freq: Two times a day (BID) | ORAL | Status: DC
  Administered 2023-04-29 – 2023-04-30 (×3): 1 mg via ORAL
  Filled 2023-04-28 (×4): qty 2

## 2023-04-28 MED ORDER — LINAGLIPTIN 5 MG PO TABS
5.0000 mg | ORAL_TABLET | Freq: Every day | ORAL | Status: DC
  Administered 2023-04-28: 5 mg via ORAL
  Filled 2023-04-28 (×2): qty 1

## 2023-04-28 MED ORDER — APIXABAN 5 MG PO TABS
5.0000 mg | ORAL_TABLET | Freq: Two times a day (BID) | ORAL | Status: DC
  Administered 2023-04-28 – 2023-04-30 (×3): 5 mg via ORAL
  Filled 2023-04-28 (×4): qty 1

## 2023-04-28 MED ORDER — INSULIN ASPART 100 UNIT/ML IJ SOLN
4.0000 [IU] | Freq: Once | INTRAMUSCULAR | Status: AC
  Administered 2023-04-28: 4 [IU] via SUBCUTANEOUS
  Filled 2023-04-28: qty 0.04

## 2023-04-28 MED ORDER — PANTOPRAZOLE SODIUM 40 MG PO TBEC: 40.0000 mg | DELAYED_RELEASE_TABLET | Freq: Every day | ORAL | Status: DC

## 2023-04-28 MED ORDER — DULOXETINE HCL 30 MG PO CPEP
60.0000 mg | ORAL_CAPSULE | Freq: Every day | ORAL | Status: DC
  Administered 2023-04-28 – 2023-04-30 (×2): 60 mg via ORAL
  Filled 2023-04-28 (×3): qty 2

## 2023-04-28 MED ORDER — INSULIN GLARGINE-YFGN 100 UNIT/ML ~~LOC~~ SOLN
20.0000 [IU] | Freq: Every day | SUBCUTANEOUS | Status: DC
  Administered 2023-04-28 – 2023-04-29 (×2): 20 [IU] via SUBCUTANEOUS
  Filled 2023-04-28 (×3): qty 0.2

## 2023-04-28 MED ORDER — CLOPIDOGREL BISULFATE 75 MG PO TABS
75.0000 mg | ORAL_TABLET | Freq: Every day | ORAL | Status: DC
  Administered 2023-04-30: 75 mg via ORAL
  Filled 2023-04-28 (×2): qty 1

## 2023-04-28 MED ORDER — METOPROLOL SUCCINATE ER 50 MG PO TB24
25.0000 mg | ORAL_TABLET | Freq: Every day | ORAL | Status: DC
  Administered 2023-04-28 – 2023-04-30 (×2): 25 mg via ORAL
  Filled 2023-04-28 (×3): qty 1

## 2023-04-28 MED ORDER — ATORVASTATIN CALCIUM 40 MG PO TABS
40.0000 mg | ORAL_TABLET | Freq: Every evening | ORAL | Status: DC
  Administered 2023-04-28 – 2023-04-29 (×2): 40 mg via ORAL
  Filled 2023-04-28 (×2): qty 1

## 2023-04-28 MED ORDER — NITROGLYCERIN 0.4 MG SL SUBL: 0.4000 mg | SUBLINGUAL_TABLET | SUBLINGUAL | Status: DC | PRN

## 2023-04-28 MED ORDER — TRAZODONE HCL 50 MG PO TABS
50.0000 mg | ORAL_TABLET | Freq: Every day | ORAL | Status: DC
  Administered 2023-04-28 – 2023-04-29 (×2): 50 mg via ORAL
  Filled 2023-04-28 (×2): qty 1

## 2023-04-28 NOTE — ED Provider Notes (Signed)
[place] EMERGENCY DEPARTMENT AT [facility] Provider Note   CSN: [id number] Arrival date & time: 04/28/23  1701     History  Chief Complaint  Patient presents with   Suicidal    Maria Fox is a 78 y.o. female.  HPI Presents with depression and suicidal thoughts.  History of same.  Had been seen by PCP 3 days ago and was reportedly doing well, however since then has gotten worse.  States she is suicidal with a plan to overdose on her medicines.  States she has not attempted however.  Denies fevers.  Denies chills.  States she still has auditory hallucinations of hearing a little man in her left ear.   Past Medical History:  Diagnosis Date   Acute cholecystitis 07/07/2019   Acute deep vein thrombosis (DVT) of left tibial vein ([name]) 07/11/2019   Acute left PCA stroke ([name]) 07/13/2015   Angioedema    Atrial flutter ([name])    Autoimmune deficiency syndrome ([name])    CAD (coronary artery disease), native coronary artery 06/2014   Chronic diastolic CHF (congestive heart failure) ([name]) 08/27/2014   Chronic kidney disease, stage 3b ([name])    Closed fracture of maxilla ([name])    COVID-19 virus infection 03/2020   CVA (cerebral infarction) 07/2014   bilateral corona radiata - periCABG   Dermatitis    eval [name] 2011: eczema, eval [name] 2011: bx negative for lichen simplex or derm herpetiformis   DM (diabetes mellitus), type 2, uncontrolled w/neurologic complication 06/02/2012   ?autonomic neuropathy, gastroparesis (06/2014)    Epidermal cyst of neck 03/25/2017   Excised by derm Diona Browner)   HCAP (healthcare-associated pneumonia) 06/2014   History of chicken pox    HLD (hyperlipidemia)    Hx of migraines    remote   Hypertension    Lobar pneumonia ([name]) 03/02/2022   Maxillary fracture ([name])    Mitral regurgitation    Multiple allergies    mold, wool, dust, feathers   NSVT (nonsustained ventricular tachycardia) ([name])    Orthostatic hypotension 07/2015   Pleural  effusion, left    RBBB    S/P lens implant    left side ([name])   Tricuspid regurgitation    UTI (urinary tract infection) 06/2014   Vitiligo     Home Medications Prior to Admission medications   Medication Sig Start Date End Date Taking? Authorizing Provider  Accu-Chek Softclix Lancets lancets 1 each by Other route as directed. 03/26/23   [provider]  acetaminophen (TYLENOL) 325 MG tablet Take 2 tablets (650 mg total) by mouth every 6 (six) hours as needed for up to 15 days for headache. 04/21/23 05/06/23  [name], NP  albuterol (PROVENTIL) (2.5 MG/3ML) 0.083% nebulizer solution Inhale 3 mLs into the lungs every 6 (six) hours as needed for wheezing or shortness of breath. 03/26/23   Verner Chol, MD  apixaban (ELIQUIS) 5 MG TABS tablet Take 1 tablet (5 mg total) by mouth 2 (two) times daily. 11/08/22   Quintella Reichert, MD  atorvastatin (LIPITOR) 40 MG tablet TAKE 1 TABLET BY MOUTH EVERY EVENING 03/06/23   Eustaquio Boyden, MD  Blood Glucose Monitoring Suppl DEVI 1 each by Does not apply route in the morning, at noon, and at bedtime. May substitute to any manufacturer covered by patient's insurance. 03/01/23   Almon Hercules, MD  Blood Glucose Monitoring Suppl DEVI 1 each by Does not apply route in the morning, at noon, and at bedtime. May substitute to  any manufacturer covered by [employer]. 03/26/23   Verner Chol, MD  clopidogrel (PLAVIX) 75 MG tablet Take 1 tablet (75 mg total) by mouth daily. 03/27/23   Verner Chol, MD  DULoxetine (CYMBALTA) 60 MG capsule Take 1 capsule (60 mg total) by mouth daily. 04/08/23   Bethann Berkshire, MD  feeding supplement, GLUCERNA SHAKE, (GLUCERNA SHAKE) LIQD Take 237 mLs by mouth 3 (three) times daily between meals. 03/26/23   Verner Chol, MD  gabapentin (NEURONTIN) 300 MG capsule Take 300 mg by mouth 2 (two) times daily.    [provider]  hydrocortisone cream 1 % Apply topically 3 (three) times daily. 03/26/23    Verner Chol, MD  insulin glargine (LANTUS) 100 UNIT/ML Solostar Pen Inject 20 Units into the skin daily. 03/25/23   Emeline General, MD  Insulin Pen Needle (PEN NEEDLES) 32G X 6 MM MISC Use 1 pen needle daily to inject insulin. 03/01/23   Almon Hercules, MD  isosorbide mononitrate (IMDUR) 30 MG 24 hr tablet Take 0.5 tablets (15 mg total) by mouth daily. 03/27/23   Verner Chol, MD  latanoprost (XALATAN) 0.005 % ophthalmic solution Place 1 drop into both eyes at bedtime. 03/26/23   Verner Chol, MD  linagliptin (TRADJENTA) 5 MG TABS tablet Take 1 tablet (5 mg total) by mouth daily. Patient taking differently: Take 5 mg by mouth daily. In addition to metformin 03/25/23   Mikey College T, MD  metFORMIN (GLUCOPHAGE-XR) 500 MG 24 hr tablet Take 1 tablet (500 mg total) by mouth daily with breakfast. 04/25/23   Eustaquio Boyden, MD  metoprolol succinate (TOPROL-XL) 25 MG 24 hr tablet Take 1 tablet (25 mg total) by mouth daily. 03/27/23   Verner Chol, MD  nitroGLYCERIN (NITROSTAT) 0.4 MG SL tablet Place 0.4 mg under the tongue every 5 (five) minutes as needed for chest pain. 04/04/23   [provider]  pantoprazole (PROTONIX) 40 MG tablet Take 1 tablet (40 mg total) by mouth daily. Patient taking differently: Take 40 mg by mouth 2 (two) times daily. 03/27/23   Verner Chol, MD  risperiDONE (RISPERDAL M-TABS) 1 MG disintegrating tablet Take 1 tablet (1 mg total) by mouth 2 (two) times daily. 04/21/23 05/21/23  [name], NP  traZODone (DESYREL) 50 MG tablet Take 1 tablet (50 mg total) by mouth at bedtime. 04/08/23   Bethann Berkshire, MD      Allergies    Bee venom, Mushroom extract complex (obsolete), Penicillins, Codeine, Sulfa antibiotics, Iohexol, and Erythromycin base    Review of Systems   Review of Systems  Physical Exam Updated Vital Signs BP (!) 160/107   Pulse 96   Temp 97.7 F (36.5 C) (Oral)   Resp 14   SpO2 100%  Physical Exam Vitals and nursing note reviewed.   Cardiovascular:     Rate and Rhythm: Regular rhythm.  Pulmonary:     Breath sounds: No wheezing.  Abdominal:     Tenderness: There is no abdominal tenderness.  Skin:    Capillary Refill: Capillary refill takes less than 2 seconds.  Neurological:     Mental Status: She is alert. Mental status is at baseline.     ED Results / Procedures / Treatments   Labs (all labs ordered are listed, but only abnormal results are displayed) Labs Reviewed  COMPREHENSIVE METABOLIC PANEL - Abnormal; Notable for the following components:      Result Value   Sodium 133 (*)    CO2 20 (*)    Glucose,  Bld 346 (*)    Creatinine, Ser 1.33 (*)    Calcium 8.8 (*)    GFR, Estimated 40 (*)    All other components within normal limits  SALICYLATE LEVEL - Abnormal; Notable for the following components:   Salicylate Lvl <7.0 (*)    All other components within normal limits  ACETAMINOPHEN LEVEL - Abnormal; Notable for the following components:   Acetaminophen (Tylenol), Serum <10 (*)    All other components within normal limits  CBC WITH DIFFERENTIAL/PLATELET - Abnormal; Notable for the following components:   Hemoglobin 11.6 (*)    All other components within normal limits  SARS CORONAVIRUS 2 BY RT PCR  ETHANOL  RAPID URINE DRUG SCREEN, HOSP PERFORMED  URINALYSIS, W/ REFLEX TO CULTURE (INFECTION SUSPECTED)    EKG None  Radiology No results found.  Procedures Procedures    Medications Ordered in ED Medications  insulin aspart (novoLOG) injection 4 Units (has no administration in time range)  albuterol (PROVENTIL) (2.5 MG/3ML) 0.083% nebulizer solution 3 mL (has no administration in time range)  apixaban (ELIQUIS) tablet 5 mg (has no administration in time range)  atorvastatin (LIPITOR) tablet 40 mg (has no administration in time range)  clopidogrel (PLAVIX) tablet 75 mg (has no administration in time range)  DULoxetine (CYMBALTA) DR capsule 60 mg (has no administration in time range)   feeding supplement (GLUCERNA SHAKE) (GLUCERNA SHAKE) liquid 237 mL (has no administration in time range)  gabapentin (NEURONTIN) capsule 300 mg (has no administration in time range)  insulin glargine-yfgn (SEMGLEE) injection 20 Units (has no administration in time range)  isosorbide mononitrate (IMDUR) 24 hr tablet 15 mg (has no administration in time range)  linagliptin (TRADJENTA) tablet 5 mg (has no administration in time range)  nitroGLYCERIN (NITROSTAT) SL tablet 0.4 mg (has no administration in time range)  metoprolol succinate (TOPROL-XL) 24 hr tablet 25 mg (has no administration in time range)  metFORMIN (GLUCOPHAGE-XR) 24 hr tablet 500 mg (has no administration in time range)  risperiDONE (RISPERDAL M-TABS) disintegrating tablet 1 mg (has no administration in time range)  pantoprazole (PROTONIX) EC tablet 40 mg (has no administration in time range)  traZODone (DESYREL) tablet 50 mg (has no administration in time range)    ED Course/ Medical Decision Making/ A&P                                 Medical Decision Making Amount and/or Complexity of Data Reviewed Labs: ordered.  Risk OTC drugs. Prescription drug management.   Patient with depression and suicidal thoughts.  Plan to overdose but no attempt.  Does have chronic depression.  Has some hallucinations.  Reviewed recent PCP note.  Also has had frequent UTIs without symptoms at this time.  Will get basic blood work.  Will get EKG.  Will get COVID testing for potential placement.  Sugar mildly elevated.  Insulin given.  Patient is medically cleared at this time.        Final Clinical Impression(s) / ED Diagnoses Final diagnoses:  Suicidal ideation    Rx / DC Orders ED Discharge Orders     None         Benjiman Core, MD 04/28/23 [phone]

## 2023-04-28 NOTE — Assessment & Plan Note (Signed)
Continues risperdal and trazodone as well as Cymbalta Referral placed to psychiatry.

## 2023-04-28 NOTE — Telephone Encounter (Signed)
I spoke with [name], SW and she said that [facility] may be a good fit because they have a geriatric psychiatrist.  Can you see if they can send the referral there?

## 2023-04-28 NOTE — Assessment & Plan Note (Signed)
S/p recent prolonged ER visit followed by geripsych stay.  Currently quiescent.  New referral to psychiatry.

## 2023-04-28 NOTE — Assessment & Plan Note (Signed)
Continue metoprolol, plavix, imdur, atorvastatin.

## 2023-04-28 NOTE — ED Triage Notes (Addendum)
Pt BIB police with reports of SI today. Pt states that everything is just coming down on her. Pt has not eaten since yesterday and states that she just does not want to eat.

## 2023-04-28 NOTE — Assessment & Plan Note (Signed)
 Continue atorvastatin

## 2023-04-28 NOTE — Assessment & Plan Note (Signed)
Sees Dr Dione Booze. Continue Xalatan eye drops

## 2023-04-28 NOTE — Patient Outreach (Signed)
  Care Coordination   Follow Up Visit Note   04/28/2023 Name: Maria Fox MRN: [id number] DOB: 1945-04-01  Maria Fox is a 78 y.o. year old female who sees Eustaquio Boyden, MD for primary care. I spoke with  Maria Fox by phone today.  What matters to the patients health and wellness today?  Per chart review patient  3 behavioral health ED visits since last telephone outreach with this RNCM. Patient states she is not feeling much better and states she may have to go back to the ED.  She states she is still having auditory/ visual hallucinations. Patient reports hearing " little man" talking in her ear and states she is still seeing things. Patient states her boyfriend Maria Fox lives with her and is there with her at the present. Patient denies having any suicidal ideations.  Patient states, " I just don't feel right."  Confirmed primary care provider placed referral to psychiatrist.  Patient verbally agreed to this RNCM having social worker, Maria Fox call her today to discuss ongoing symptoms.  Collaboration call make with Child psychotherapist.  Clinical manager contacted to determine which psychiatric provider office was referred.   Patient states she started on the metformin as prescribed. Denies any side effects/ concerns. Patient states she has decided not to take the lantas at this time    Goals Addressed             This Visit's Progress    Management of chronic health conditions.       Interventions Today    Flowsheet Row Most Recent Value  Chronic Disease   Chronic disease during today's visit Other  General Interventions   General Interventions Discussed/Reviewed General Interventions Reviewed, Communication with  Communication with Social Work  [employer] with social worker, Maria Fox regarding patients current verbalized symptoms/ complaints.  Inquired if social worker is able to call patient today.  Contacted clinical manager to determine which provider referral was sent  to.]  Mental Health Interventions   Mental Health Discussed/Reviewed Mental Health Reviewed, Other  [Active listening and support. Inquired if patient having suicidal ideation and/ or ongoing hallucinations.]  Pharmacy Interventions   Pharmacy Dicussed/Reviewed Pharmacy Topics Reviewed  [Inquired if patient is taking prescribed]              SDOH assessments and interventions completed:  No     Care Coordination Interventions:  Yes, provided   Follow up plan: Follow up call scheduled for 05/13/23 at 2:45    Encounter Outcome:  Patient Visit Completed   Maria Ina RN, BSN, CCM [place]  [facility], Population Health Case Manager Phone: [phone]

## 2023-04-28 NOTE — Assessment & Plan Note (Addendum)
Chronic, uncontrolled. She states she was taken off fortamet, unclear reason why. She is not taking lantus - she remains hesitant for injections.  She only is taking glipizide 10mg  bid and tradenta 5mg  daily.  Will start metformin XR 500mg  once daily.  Encouraged she try lantus if hyperglycemia continues.

## 2023-04-28 NOTE — Assessment & Plan Note (Signed)
Has seen [name] Dr Ashley Royalty, not recently.

## 2023-04-28 NOTE — Assessment & Plan Note (Signed)
Continue gabapentin 300 mg bid. 

## 2023-04-28 NOTE — Assessment & Plan Note (Signed)
 Update labs today

## 2023-04-28 NOTE — Assessment & Plan Note (Deleted)
Sees Dr Dione Booze. Continue Xalatan eye drops

## 2023-04-28 NOTE — Patient Outreach (Signed)
  Care Coordination   Collaboration   Visit Note   04/28/2023 Name: Maria Fox MRN: [id number] DOB: July 08, 1945  Maria Fox is a 78 y.o. year old female who sees Maria Boyden, MD for primary care. I  spoke with RNCM Maria Fox  What matters to the patients health and wellness today?  Ongoing mental health follow up and community resources   Goals Addressed             This Visit's Progress    care coordination activities       Activities and task to complete in order to accomplish goals.   EMOTIONAL / MENTAL HEALTH SUPPORT CSW to follow up with patient regarding mental health and community resource needs CSW will confirm insurance coverage for possible referral to the [facility]         SDOH assessments and interventions completed:  No     Care Coordination Interventions:  Yes, provided  Interventions Today    Flowsheet Row Most Recent Value  General Interventions   General Interventions Discussed/Reviewed Communication with  Communication with RN  [collaboration phone call from RNCM-discussed patient's current symptoms, hospital re-admissions and recommendation for close mental health follow up-CSW to contact patient today for mental health support and resources]       Follow up plan: Follow up call scheduled for 04/28/23    Encounter Outcome:  Patient Visit Completed

## 2023-04-28 NOTE — Patient Instructions (Signed)
Visit Information  Thank you for taking time to visit with me today. Please don't hesitate to contact me if I can be of assistance to you.   Following are the goals we discussed today:   Goals Addressed             This Visit's Progress    Management of chronic health conditions.       Interventions Today    Flowsheet Row Most Recent Value  Chronic Disease   Chronic disease during today's visit Other  General Interventions   General Interventions Discussed/Reviewed General Interventions Reviewed, Communication with  Communication with Social Work  [employer] with social worker, [employer] regarding patients current verbalized symptoms/ complaints.  Inquired if social worker is able to call patient today.  Contacted clinical manager to determine which provider referral was sent to.]  Mental Health Interventions   Mental Health Discussed/Reviewed Mental Health Reviewed, Other  [Active listening and support. Inquired if patient having suicidal ideation and/ or ongoing hallucinations.]  Pharmacy Interventions   Pharmacy Dicussed/Reviewed Pharmacy Topics Reviewed  [Inquired if patient is taking prescribed]              Our next appointment is by telephone on 05/13/23 at 2: 45 pm  Please call the care guide team at [phone] if you need to cancel or reschedule your appointment.   If you are experiencing a Mental Health or Behavioral Health Crisis or need someone to talk to, please call the Suicide and Crisis Lifeline: 988 call 1-800-273-TALK (toll free, 24 hour hotline)  The patient verbalized understanding of instructions, educational materials, and care plan provided today and DECLINED offer to receive copy of patient instructions, educational materials, and care plan.   [name] RN, BSN, CCM [employer], Population Health Case Manager Phone: [phone]

## 2023-04-28 NOTE — Patient Outreach (Signed)
  Care Coordination   04/28/2023 Name: Maria Fox MRN: [id number] DOB: Aug 07, 1945   Care Coordination Outreach Attempts:  An unsuccessful telephone outreach was attempted today to offer the patient information about available complex care management services.  Follow Up Plan:  Additional outreach attempts will be made to offer the patient complex care management information and services.   Encounter Outcome:  No Answer   Care Coordination Interventions:  No, not indicated    Carolanne Mercier, LCSW [place]  [facility], [facility] Licensed Clinical Social Worker Care Coordinator  Direct Dial: [phone]

## 2023-04-28 NOTE — Assessment & Plan Note (Signed)
Continue plavix, atorvastatin. Also on eliquis

## 2023-04-28 NOTE — Assessment & Plan Note (Signed)
 Continue eliquis  ?

## 2023-04-28 NOTE — Assessment & Plan Note (Signed)
 Seems euvolemic.

## 2023-04-28 NOTE — Assessment & Plan Note (Signed)
Currently symptoms controlled as she's taking cephalexin 500mg  once daily from Rx received from ER 04/07/2023.  New urology referral placed.  Has not seen them for recurrent UTI yet.

## 2023-04-29 DIAGNOSIS — F32A Depression, unspecified: Secondary | ICD-10-CM

## 2023-04-29 LAB — RAPID URINE DRUG SCREEN, HOSP PERFORMED
Amphetamines: NOT DETECTED
Barbiturates: NOT DETECTED
Benzodiazepines: NOT DETECTED
Cocaine: NOT DETECTED
Opiates: NOT DETECTED
Tetrahydrocannabinol: NOT DETECTED

## 2023-04-29 LAB — URINALYSIS, W/ REFLEX TO CULTURE (INFECTION SUSPECTED)
Bilirubin Urine: NEGATIVE
Glucose, UA: >=500 mg/dL — AB
Hgb urine dipstick: NEGATIVE
Ketones, ur: NEGATIVE mg/dL
Nitrite: NEGATIVE
Protein, ur: NEGATIVE mg/dL
Specific Gravity, Urine: 1.01 (ref 1.005–1.030)
pH: 5 (ref 5.0–8.0)

## 2023-04-29 LAB — CBG MONITORING, ED
Glucose-Capillary: 171 mg/dL — ABNORMAL HIGH (ref 70–99)
Glucose-Capillary: 159 mg/dL — ABNORMAL HIGH (ref 70–99)

## 2023-04-29 LAB — SARS CORONAVIRUS 2 BY RT PCR: SARS Coronavirus 2 by RT PCR: NEGATIVE

## 2023-04-29 MED ORDER — CEPHALEXIN 500 MG PO CAPS
500.0000 mg | ORAL_CAPSULE | Freq: Two times a day (BID) | ORAL | Status: DC
  Administered 2023-04-29 – 2023-04-30 (×2): 500 mg via ORAL
  Filled 2023-04-29 (×2): qty 1

## 2023-04-29 MED ORDER — ACETAMINOPHEN 325 MG PO TABS
650.0000 mg | ORAL_TABLET | Freq: Four times a day (QID) | ORAL | Status: DC | PRN
Start: 2023-04-29 — End: 2023-04-30
  Administered 2023-04-29 – 2023-04-30 (×3): 650 mg via ORAL
  Filled 2023-04-29 (×3): qty 2

## 2023-04-29 MED ORDER — DORZOLAMIDE HCL-TIMOLOL MAL 2-0.5 % OP SOLN
1.0000 [drp] | Freq: Two times a day (BID) | OPHTHALMIC | Status: DC
  Administered 2023-04-29 – 2023-04-30 (×2): 1 [drp] via OPHTHALMIC
  Filled 2023-04-29: qty 10

## 2023-04-29 MED ORDER — MELATONIN 3 MG PO TABS
3.0000 mg | ORAL_TABLET | Freq: Every day | ORAL | Status: DC
  Administered 2023-04-29 (×2): 3 mg via ORAL
  Filled 2023-04-29 (×2): qty 1

## 2023-04-29 MED ORDER — CARMEX CLASSIC LIP BALM EX OINT
TOPICAL_OINTMENT | Freq: Once | CUTANEOUS | Status: AC
  Administered 2023-04-29: 1 via TOPICAL
  Filled 2023-04-29: qty 10

## 2023-04-29 MED ORDER — LATANOPROST 0.005 % OP SOLN
1.0000 [drp] | Freq: Every day | OPHTHALMIC | Status: DC
  Administered 2023-04-29: 1 [drp] via OPHTHALMIC
  Filled 2023-04-29: qty 2.5

## 2023-04-29 NOTE — ED Notes (Signed)
[facility] called pts brother Brady Plant) for collateral information and to get an update on the progress of finding a long term placement for pt. Pts brother did not answer and no message could be left due to the voicemail box being full. [facility] will try again.   Jacquelynn Cree, [facility]  04/29/23

## 2023-04-29 NOTE — ED Notes (Signed)
1 belongings bag placed in locker # 31

## 2023-04-29 NOTE — Telephone Encounter (Signed)
I have faxed everything over to [facility] in [place].   It looks like the patient was seen again in the ED yesterday 2/17 and is currently being held.   Sending to PCP as FYI.

## 2023-04-29 NOTE — ED Notes (Signed)
[facility] called pts brother again for collateral information. Pts brother did not answer and no message could be left due to the voicemail box being full. [facility] will try again.   Jacquelynn Cree, [facility]  04/29/23

## 2023-04-29 NOTE — Telephone Encounter (Signed)
 Noted! Thank you

## 2023-04-29 NOTE — Consult Note (Signed)
[facility] Consult Note  Patient Name: Maria Fox MRN: [id number] DOB: 18-Feb-1946 DATE OF Consult: 04/29/2023  PRIMARY PSYCHIATRIC DIAGNOSES Rule out Neurocognitive disorder major; Unspecified depressive disorder; Unspecified anxiety disorder; Rule out delirium due to general medical condition  Based on my current evaluation and assessment of the patient, she is an 78 y.o. female who presents with suicidal ideations with plan and intent to take an intentional ingestion; patinet has access to the means. Patient with memory and cognition deficits as well that are causing it to be increasingly difficult for patient to manage her own medications, activities of daily living and maintain an organized living space. Patient was unable to contract for safety. The patient's presentation is consistent with Rule out Neurocognitive disorder major; Unspecified depressive disorder; Unspecified anxiety disorder; Rule out delirium due to general medical condition. Therefore, patient does meet criteria for an intensive inpatient psychiatric hospitalization.  RECOMMENDATIONS   Medication recommendations:  Risks, benefits, side effects and alternatives to treatments reviewed:  -Continue home psychotropic regimen (recommend performing a medication reconciliation with patient's pharmacy to ascertain accuracy of reported regimen): per chart documentation from recent inpatient psychiatric treatment discharge summary from  03/2023, duloxetine 60 mg daily for anxiety and depression, trazodone 50 mg at bedtime for insomnia  As needed medications to manage patient's acute symptoms while in hospital care: QTc is 479 ms as of 04/2023 -Maximize utilization of verbal de-escalation techniques, if attempts are unsuccessful and patient poses a threat to self and others: Consider lorazepam 0.25 mg to 0.5 mg PO/IM every 6 hours as needed for severe agitation only. Would offer patient the option of taking PO medication first, but  if patient refuses then may administer IM medication as a last resort. Would not offer antipsychotic medications to calm patient as these are known to prolong QTc, and patient's interval is already prolonged.    Non-Medication recommendations:  -Note: Please stop all antipsychotic and QTc prolonging medications if patient's QTc is greater than 480 ms (except for aripiprazole, which demonstrates reduction of QTc per case reports and literature review). Of note, to decrease the risk of prolonged QTc, please maintain potassium and magnesium levels within normal ranges. -Agree with work up for organic causes of altered mentation and mood dysregulation, consider the following if not already performed: CT of the head, CBC and differential, basic metabolic profile, liver function tests (if abnormal consider ammonia level), urinalysis, urine toxicology screen, vitamin B12 level, vitamin D level, TSH with reflex free T4 -Recommend continued delirium precautions to include frequent reorientation; maintain patient in an appropriately lighted and darkened room to correspond with day and night time (a room with a window is preferred); fall precautions; frequent evaluation of patient's prescribed medications and eliminating unnecessary or redundant medications if clinically appropriate; ascertaining that patient has access to glasses and hearing aids; and limiting anticholinergic medications, opioid analgesics and benzodiazepines if clinically appropriate to minimize iatrogenic contributors to delirium   Observation recommendations:  per unit protocol for management of suicidal patient     Is inpatient psychiatric hospitalization recommended for this patient? Yes (Explain why): patient is at high risk to self and this time  Follow-Up Telepsychiatry C/L services: We will continue to follow this patient with you until stabilized or discharged.  If you have any questions or concerns, please call our TeleCare  Coordination service at  [phone] and ask for myself or the provider on-call.  Communication: Treatment team members (and family members if applicable) who were involved in treatment/care discussions and  planning, and with whom we spoke or engaged with via secure text/chat, include the following: primary team  Thank you for involving [place] in the care of this patient. If you have any additional questions or concerns, please call [phone] and ask for me or the provider on-call.  Total time spent in this encounter was 60 minutes with greater than 50% of time spent in counseling and coordination of care.  TELEPSYCHIATRY ATTESTATION & CONSENT  As the provider for this telehealth consult, I attest that I verified the patient's identity using two separate identifiers, introduced myself to the patient, provided my credentials, disclosed my location, and performed this encounter via a HIPAA-compliant, real-time, face-to-face, two-way, interactive audio and video platform and with the full consent and agreement of the patient (or guardian as applicable.)  [place] Telehealth provider physical location: home office in state of [place].  Video start time: 0130 (Central Time) Video end time: 0150 (Central Time)  IDENTIFYING DATA  Maria Fox is a 78 y.o. year-old female for whom a psychiatric consultation has been ordered by the primary provider. The patient was identified using two separate identifiers.  CHIEF COMPLAINT/REASON FOR CONSULT  Suicidal ideations  HISTORY OF PRESENT ILLNESS (HPI)  I evaluated the patient today face-to-face via secure, HIPAA-compliant telepsychiatric connection, and at the request of the primary treatment team. The reason for the telepsychiatric consultation is that the patient is an 78 year old female with multiple medical comorbidities including essential hypertension, Type II diabetes mellitus, hyperlipidemia, chronic kidney disease stage 3a,  osteopenia, coronary artery disease, congestive heart failure, major depressive disorder severe with psychosis, functional abdominal pain syndrome and generalized anxiety disorder who presents for psychiatric evaluation given suicidal ideations with plan to take an intentional ingestion. Primary team is seeking psychotropic medication recommendations, safety evaluation to determine appropriateness for more intensive psychiatric services and diagnostic clarity as to the patient's presentation.   During one-on-one evaluation with this provider, patient was alert and oriented to self, location and situation but not to time (asserting that the year is "1947"). The patient did not appear to be inappropriately internally preoccupied; patient's thought process was linear and concrete. Patient asserted that she has difficulties with short and long term memory, explaining that she will often lose her belongings and need to search for them. She lives with her friend [name] who helps patient organize her medications; however, she too will distribute her pills in the medication organizer. Patient denies ever not taking her medications as prescribed either on purpose or by mistake. Patient explained that she is tormented by a "little man" with a "flower stem body" that will run up on her shoulder and then in her ear make a sound. She will see glimpses of this figure at intervals. She explained that she has become increasingly depressed to the extent that she is seriously considering to take an intentional ingestion. She refused to engage in safety or treatment planning; she does not wish for [name] to know about all this. She reported that she is struggling in terms of her daily functioning and ability to care for self as she is increasingly staying in bed throughout the day. She will forego taking a shower/bathe or eat regular meals when she has been confined to her bed, which has been occurring on a daily basis leading up to  this presentation to the ED. She was unable to contract for safety; however, she is hesitantly amenable to engage in intensive inpatient psychiatric admission.  PAST PSYCHIATRIC HISTORY  Inpatient psychiatric treatment: per chart documentation, patient admitted in 03/2023 given worsening depression  Outpatient mental health treatment: per chart documentation, patient recommended to follow at [facility] for outpatient mental health services.  Current home psychotropic medications: per chart documentation from recent inpatient psychiatric treatment discharge summary from  03/2023, duloxetine 60 mg daily for anxiety and depression, trazodone 50 mg at bedtime for insomnia  Suicide attempts: per patient, denies  Trauma history: patient did not assert concerns for trauma or exploitation  Otherwise as per HPI above.  PAST MEDICAL HISTORY  Past Medical History:  Diagnosis Date   Acute cholecystitis 07/07/2019   Acute deep vein thrombosis (DVT) of left tibial vein ([name]) 07/11/2019   Acute left PCA stroke ([name]) 07/13/2015   Angioedema    Atrial flutter ([name])    Autoimmune deficiency syndrome ([name])    CAD (coronary artery disease), native coronary artery 06/2014   Chronic diastolic CHF (congestive heart failure) ([name]) 08/27/2014   Chronic kidney disease, stage 3b ([name])    Closed fracture of maxilla ([name])    COVID-19 virus infection 03/2020   CVA (cerebral infarction) 07/2014   bilateral corona radiata - periCABG   Dermatitis    eval [name] 2011: eczema, eval [name] 2011: bx negative for lichen simplex or derm herpetiformis   DM (diabetes mellitus), type 2, uncontrolled w/neurologic complication 06/02/2012   ?autonomic neuropathy, gastroparesis (06/2014)    Epidermal cyst of neck 03/25/2017   Excised by derm Diona Browner)   HCAP (healthcare-associated pneumonia) 06/2014   History of chicken pox    HLD (hyperlipidemia)    Hx of migraines    remote   Hypertension    Lobar pneumonia ([name]) 03/02/2022    Maxillary fracture ([name])    Mitral regurgitation    Multiple allergies    mold, wool, dust, feathers   NSVT (nonsustained ventricular tachycardia) ([name])    Orthostatic hypotension 07/2015   Pleural effusion, left    RBBB    S/P lens implant    left side ([name])   Tricuspid regurgitation    UTI (urinary tract infection) 06/2014   Vitiligo      HOME MEDICATIONS  Facility Ordered Medications  Medication   [COMPLETED] insulin aspart (novoLOG) injection 4 Units   albuterol (PROVENTIL) (2.5 MG/3ML) 0.083% nebulizer solution 3 mL   apixaban (ELIQUIS) tablet 5 mg   atorvastatin (LIPITOR) tablet 40 mg   clopidogrel (PLAVIX) tablet 75 mg   DULoxetine (CYMBALTA) DR capsule 60 mg   feeding supplement (GLUCERNA SHAKE) (GLUCERNA SHAKE) liquid 237 mL   gabapentin (NEURONTIN) capsule 300 mg   insulin glargine-yfgn (SEMGLEE) injection 20 Units   isosorbide mononitrate (IMDUR) 24 hr tablet 15 mg   linagliptin (TRADJENTA) tablet 5 mg   nitroGLYCERIN (NITROSTAT) SL tablet 0.4 mg   metoprolol succinate (TOPROL-XL) 24 hr tablet 25 mg   metFORMIN (GLUCOPHAGE-XR) 24 hr tablet 500 mg   risperiDONE (RISPERDAL M-TABS) disintegrating tablet 1 mg   traZODone (DESYREL) tablet 50 mg   pantoprazole (PROTONIX) EC tablet 40 mg   acetaminophen (TYLENOL) tablet 650 mg   melatonin tablet 3 mg   [COMPLETED] lip balm (CARMEX) ointment   PTA Medications  Medication Sig   apixaban (ELIQUIS) 5 MG TABS tablet Take 1 tablet (5 mg total) by mouth 2 (two) times daily.   atorvastatin (LIPITOR) 40 MG tablet TAKE 1 TABLET BY MOUTH EVERY EVENING   isosorbide mononitrate (IMDUR) 30 MG 24 hr tablet Take 0.5 tablets (15 mg total) by mouth daily.  metoprolol succinate (TOPROL-XL) 25 MG 24 hr tablet Take 1 tablet (25 mg total) by mouth daily.   pantoprazole (PROTONIX) 40 MG tablet Take 1 tablet (40 mg total) by mouth daily. (Patient taking differently: Take 40 mg by mouth 2 (two) times daily as needed (for reflux- 30 minutes  before a meal).)   clopidogrel (PLAVIX) 75 MG tablet Take 1 tablet (75 mg total) by mouth daily.   feeding supplement, GLUCERNA SHAKE, (GLUCERNA SHAKE) LIQD Take 237 mLs by mouth 3 (three) times daily between meals.   hydrocortisone cream 1 % Apply topically 3 (three) times daily.   latanoprost (XALATAN) 0.005 % ophthalmic solution Place 1 drop into both eyes at bedtime.   gabapentin (NEURONTIN) 300 MG capsule Take 300 mg by mouth 2 (two) times daily.   DULoxetine (CYMBALTA) 60 MG capsule Take 1 capsule (60 mg total) by mouth daily.   traZODone (DESYREL) 50 MG tablet Take 1 tablet (50 mg total) by mouth at bedtime.   nitroGLYCERIN (NITROSTAT) 0.4 MG SL tablet Place 0.4 mg under the tongue every 5 (five) minutes as needed for chest pain.   acetaminophen (TYLENOL) 325 MG tablet Take 2 tablets (650 mg total) by mouth every 6 (six) hours as needed for up to 15 days for headache.   risperiDONE (RISPERDAL M-TABS) 1 MG disintegrating tablet Take 1 tablet (1 mg total) by mouth 2 (two) times daily.   metFORMIN (GLUCOPHAGE-XR) 500 MG 24 hr tablet Take 1 tablet (500 mg total) by mouth daily with breakfast.   dorzolamide-timolol (COSOPT) 2-0.5 % ophthalmic solution Place 1 drop into both eyes 2 (two) times daily.   Blood Glucose Monitoring Suppl DEVI 1 each by Does not apply route in the morning, at noon, and at bedtime. May substitute to any manufacturer covered by patient's insurance.   Insulin Pen Needle (PEN NEEDLES) 32G X 6 MM MISC Use 1 pen needle daily to inject insulin.   linagliptin (TRADJENTA) 5 MG TABS tablet Take 1 tablet (5 mg total) by mouth daily. (Patient not taking: Reported on 04/28/2023)   insulin glargine (LANTUS) 100 UNIT/ML Solostar Pen Inject 20 Units into the skin daily. (Patient not taking: Reported on 04/28/2023)   albuterol (PROVENTIL) (2.5 MG/3ML) 0.083% nebulizer solution Inhale 3 mLs into the lungs every 6 (six) hours as needed for wheezing or shortness of breath. (Patient not  taking: Reported on 04/28/2023)   Blood Glucose Monitoring Suppl DEVI 1 each by Does not apply route in the morning, at noon, and at bedtime. May substitute to any manufacturer covered by patient's insurance.   Accu-Chek Softclix Lancets lancets 1 each by Other route as directed.    ALLERGIES  Allergies  Allergen Reactions   Bee Venom Anaphylaxis, Swelling and Other (See Comments)    "Throat swelling"   Mushroom Extract Complex (Obsolete) Anaphylaxis   Penicillins Anaphylaxis, Hives, Swelling and Other (See Comments)    Tolerates cephalosporins including cephalexin multiple times.  Has patient had a PCN reaction causing immediate rash, facial/tongue/throat swelling, SOB or lightheadedness with hypotension: Yes Has patient had a PCN reaction causing severe rash involving mucus membranes or skin necrosis: Yes Has patient had a PCN reaction that required hospitalization Yes Has patient had a PCN reaction occurring within the last 10 years: No   Codeine Nausea And Vomiting   Sulfa Antibiotics Nausea And Vomiting   Iohexol Itching, Swelling and Other (See Comments)    Iohexol, sold under the trade name Omnipaque among others, is a contrast agent used for X-ray  imaging.   Erythromycin Base Rash    SOCIAL & SUBSTANCE USE HISTORY  Social History   Socioeconomic History   Marital status: Single    Spouse name: Not on file   Number of children: 0   Years of education: Not on file   Highest education level: Not on file  Occupational History   Occupation: mail room    Employer: [employer]   Occupation: retired  Tobacco Use   Smoking status: Never   Smokeless tobacco: Never  Vaping Use   Vaping status: Never Used  Substance and Sexual Activity   Alcohol use: No   Drug use: No   Sexual activity: Not Currently  Other Topics Concern   Not on file  Social History Narrative   Caffeine: occasional   Lives with friend, [name], 1 cat   Occupation: Recruitment consultant, and mailer at [employer]   Edu: HS   Activity: walks daily (2-3 blocks)   Diet: good water, daily fruits/vegetables      [facility] unable to reach patient to establish care (04/2015)   Social Drivers of Health   Financial Resource Strain: Low Risk  (12/03/2022)   Overall Financial Resource Strain (CARDIA)    Difficulty of Paying Living Expenses: Not hard at all  Food Insecurity: No Food Insecurity (03/17/2023)   Hunger Vital Sign    Worried About Running Out of Food in the Last Year: Never true    Ran Out of Food in the Last Year: Never true  Transportation Needs: No Transportation Needs (03/17/2023)   PRAPARE - Administrator, Civil Service (Medical): No    Lack of Transportation (Non-Medical): No  Physical Activity: Inactive (12/03/2022)   Exercise Vital Sign    Days of Exercise per Week: 0 days    Minutes of Exercise per Session: 0 min  Stress: No Stress Concern Present (12/03/2022)   [employer] of Occupational Health - Occupational Stress Questionnaire    Feeling of Stress : Not at all  Social Connections: Moderately Integrated (03/17/2023)   Social Connection and Isolation Panel [NHANES]    Frequency of Communication with Friends and Family: More than three times a week    Frequency of Social Gatherings with Friends and Family: More than three times a week    Attends Religious Services: More than 4 times per year    Active Member of [employer] or Organizations: No    Attends Engineer, structural: Never    Marital Status: Living with partner   Social History   Tobacco Use  Smoking Status Never  Smokeless Tobacco Never   Social History   Substance and Sexual Activity  Alcohol Use No   Social History   Substance and Sexual Activity  Drug Use No  .  FAMILY HISTORY  Family History  Problem Relation Age of Onset   Throat cancer Father    Diabetes type II Mother    Hypertension Mother    Hyperlipidemia Mother    Colon cancer Mother     Cervical cancer Maternal Grandmother    Hypertension Brother    Hyperlipidemia Brother    Asthma Brother    Coronary artery disease Neg Hx    Stroke Neg Hx    Family Psychiatric History (if known):  none disclosed    MENTAL STATUS EXAM (MSE)  Mental Status Exam: General Appearance: Fairly Groomed  Orientation:  Full (Time, Place, and Person)  Memory:  Immediate;  Fair Recent;   Fair Remote;   Poor  Concentration:  Concentration: Fair and Attention Span: Poor  Recall:  Fair  Attention  Poor  Eye Contact:  Good  Speech:  Normal Rate  Language:  Fair  Volume:  Normal  Mood: " not good"  Affect:  Constricted  Thought Process:  Goal Directed  Thought Content:  Negative  Suicidal Thoughts:  No  Homicidal Thoughts:  No  Judgement:  Fair  Insight:  Lacking  Psychomotor Activity:  Decreased  Akathisia:  No  Fund of Knowledge:  Fair    Assets:  Desire for Improvement  Cognition:  Impaired,  Mild  ADL's:  Impaired  AIMS (if indicated):       VITALS  Blood pressure (!) 163/86, pulse (!) 101, temperature 97.7 F (36.5 C), temperature source Oral, resp. rate 14, SpO2 100%.  LABS  Admission on 04/28/2023  Component Date Value Ref Range Status   Sodium 04/28/2023 133 (L)  135 - 145 mmol/L Final   Potassium 04/28/2023 4.2  3.5 - 5.1 mmol/L Final   Chloride 04/28/2023 105  98 - 111 mmol/L Final   CO2 04/28/2023 20 (L)  22 - 32 mmol/L Final   Glucose, Bld 04/28/2023 346 (H)  70 - 99 mg/dL Final   Glucose reference range applies only to samples taken after fasting for at least 8 hours.   BUN 04/28/2023 13  8 - 23 mg/dL Final   Creatinine, Ser 04/28/2023 1.33 (H)  0.44 - 1.00 mg/dL Final   Calcium 91/47/8295 8.8 (L)  8.9 - 10.3 mg/dL Final   Total Protein 62/13/0865 7.5  6.5 - 8.1 g/dL Final   Albumin 78/46/9629 3.8  3.5 - 5.0 g/dL Final   AST 52/84/1324 16  15 - 41 U/L Final   ALT 04/28/2023 13  0 - 44 U/L Final   Alkaline Phosphatase 04/28/2023 79  38 - 126 U/L Final   Total  Bilirubin 04/28/2023 0.4  0.0 - 1.2 mg/dL Final   GFR, Estimated 04/28/2023 40 (L)  >60 mL/min Final   Comment: (NOTE) Calculated using the CKD-EPI Creatinine Equation (2021)    Anion gap 04/28/2023 8  5 - 15 Final   Performed at [facility], [address], [place], [place] [address]   Alcohol, Ethyl (B) 04/28/2023 <10  <10 mg/dL Final   Comment: (NOTE) Lowest detectable limit for serum alcohol is 10 mg/dL.  For medical purposes only. Performed at [facility], [address], [place], [place] [address]    Salicylate Lvl 04/28/2023 <7.0 (L)  7.0 - 30.0 mg/dL Final   Performed at [facility], [address], [place], [place] [address]   Acetaminophen (Tylenol), Serum 04/28/2023 <10 (L)  10 - 30 ug/mL Final   Comment: (NOTE) Therapeutic concentrations vary significantly. A range of 10-30 ug/mL  may be an effective concentration for many patients. However, some  are best treated at concentrations outside of this range. Acetaminophen concentrations >150 ug/mL at 4 hours after ingestion  and >50 ug/mL at 12 hours after ingestion are often associated with  toxic reactions.  Performed at [facility], [address] [place], [place] [address]    Opiates 04/29/2023 NONE DETECTED  NONE DETECTED Final   Cocaine 04/29/2023 NONE DETECTED  NONE DETECTED Final   Benzodiazepines 04/29/2023 NONE DETECTED  NONE DETECTED Final   Amphetamines 04/29/2023 NONE DETECTED  NONE DETECTED Final   Tetrahydrocannabinol 04/29/2023 NONE DETECTED  NONE DETECTED Final  Barbiturates 04/29/2023 NONE DETECTED  NONE DETECTED Final   Comment: (NOTE) DRUG SCREEN FOR MEDICAL PURPOSES ONLY.  IF CONFIRMATION IS NEEDED FOR ANY PURPOSE, NOTIFY LAB WITHIN 5 DAYS.  LOWEST DETECTABLE LIMITS FOR URINE DRUG SCREEN Drug Class                     Cutoff (ng/mL) Amphetamine and metabolites    1000 Barbiturate and metabolites     200 Benzodiazepine                 200 Opiates and metabolites        300 Cocaine and metabolites        300 THC                            50 Performed at [facility], [address] [place], [place] [address]    WBC 04/28/2023 7.4  4.0 - 10.5 K/uL Final   RBC 04/28/2023 4.06  3.87 - 5.11 MIL/uL Final   Hemoglobin 04/28/2023 11.6 (L)  12.0 - 15.0 g/dL Final   HCT 60/45/4098 36.0  36.0 - 46.0 % Final   MCV 04/28/2023 88.7  80.0 - 100.0 fL Final   MCH 04/28/2023 28.6  26.0 - 34.0 pg Final   MCHC 04/28/2023 32.2  30.0 - 36.0 g/dL Final   RDW 11/91/4782 13.4  11.5 - 15.5 % Final   Platelets 04/28/2023 236  150 - 400 K/uL Final   nRBC 04/28/2023 0.0  0.0 - 0.2 % Final   Neutrophils Relative % 04/28/2023 71  % Final   Neutro Abs 04/28/2023 5.3  1.7 - 7.7 K/uL Final   Lymphocytes Relative 04/28/2023 20  % Final   Lymphs Abs 04/28/2023 1.5  0.7 - 4.0 K/uL Final   Monocytes Relative 04/28/2023 5  % Final   Monocytes Absolute 04/28/2023 0.4  0.1 - 1.0 K/uL Final   Eosinophils Relative 04/28/2023 3  % Final   Eosinophils Absolute 04/28/2023 0.2  0.0 - 0.5 K/uL Final   Basophils Relative 04/28/2023 1  % Final   Basophils Absolute 04/28/2023 0.1  0.0 - 0.1 K/uL Final   Immature Granulocytes 04/28/2023 0  % Final   Abs Immature Granulocytes 04/28/2023 0.02  0.00 - 0.07 K/uL Final   Performed at [facility], [address], [place], [place] [address]   Specimen Source 04/29/2023 URINE, UNSPE   Final   Color, Urine 04/29/2023 YELLOW  YELLOW Final   APPearance 04/29/2023 CLEAR  CLEAR Final   Specific Gravity, Urine 04/29/2023 1.010  1.005 - 1.030 Final   pH 04/29/2023 5.0  5.0 - 8.0 Final   Glucose, UA 04/29/2023 >=500 (A)  NEGATIVE mg/dL Final   Hgb urine dipstick 04/29/2023 NEGATIVE  NEGATIVE Final   Bilirubin Urine 04/29/2023 NEGATIVE  NEGATIVE Final   Ketones, ur 04/29/2023 NEGATIVE  NEGATIVE mg/dL Final   Protein, ur 30/86/5784 NEGATIVE  NEGATIVE  mg/dL Final   Nitrite 69/62/9528 NEGATIVE  NEGATIVE Final   Leukocytes,Ua 04/29/2023 MODERATE (A)  NEGATIVE Final   RBC / HPF 04/29/2023 0-5  0 - 5 RBC/hpf Final   WBC, UA 04/29/2023 21-50  0 - 5 WBC/hpf Final   Comment:        Reflex urine culture not performed if WBC <=10, OR if Squamous epithelial cells >5. If Squamous epithelial cells >5 suggest recollection.    Bacteria, UA 04/29/2023 RARE (A)  NONE SEEN Final  Squamous Epithelial / HPF 04/29/2023 0-5  0 - 5 /HPF Final   Mucus 04/29/2023 PRESENT   Final   Performed at [facility], [address], [place], [place] [address]   SARS Coronavirus 2 by RT PCR 04/29/2023 NEGATIVE  NEGATIVE Final   Comment: (NOTE) SARS-CoV-2 target nucleic acids are NOT DETECTED.  The SARS-CoV-2 RNA is generally detectable in upper and lower respiratory specimens during the acute phase of infection. The lowest concentration of SARS-CoV-2 viral copies this assay can detect is 250 copies / mL. A negative result does not preclude SARS-CoV-2 infection and should not be used as the sole basis for treatment or other patient management decisions.  A negative result may occur with improper specimen collection / handling, submission of specimen other than nasopharyngeal swab, presence of viral mutation(s) within the areas targeted by this assay, and inadequate number of viral copies (<250 copies / mL). A negative result must be combined with clinical observations, patient history, and epidemiological information.  Fact Sheet for Patients:   RoadLapTop.co.za  Fact Sheet for Healthcare Providers: http://kim-miller.com/  This test is not yet approved or                           cleared by the [place] FDA and has been authorized for detection and/or diagnosis of SARS-CoV-2 by FDA under an Emergency Use Authorization (EUA).  This EUA will remain in effect (meaning this test can be used)  for the duration of the COVID-19 declaration under Section 564(b)(1) of the Act, 21 U.S.C. section 360bbb-3(b)(1), unless the authorization is terminated or revoked sooner.  Performed at [facility], [address] [place], [place] [address]    Glucose-Capillary 04/28/2023 261 (H)  70 - 99 mg/dL Final   Glucose reference range applies only to samples taken after fasting for at least 8 hours.    PSYCHIATRIC REVIEW OF SYSTEMS (ROS)  ROS: Notable for the following relevant positive findings: ROS  Additional findings:      Musculoskeletal: No abnormal movements observed      Gait & Station: Laying/Sitting      Pain Screening: Present - mild to moderate      Nutrition & Dental Concerns: Decreased intake  RISK FORMULATION/ASSESSMENT  Is the patient experiencing any suicidal or homicidal ideations: Yes       Explain if yes: suicidal ideations with plan and intent to take an intentional ingestion; patinet has access to the means.   Protective factors considered for safety management: Current care in a highly monitored health care setting   Risk factors/concerns considered for safety management:  Depression Physical illness/chronic pain Age over 56 Hopelessness Isolation Barriers to accessing treatment Unmarried  Is there a safety management plan with the patient and treatment team to minimize risk factors and promote protective factors: Yes           Explain: psychiatric hospitalization Is crisis care placement or psychiatric hospitalization recommended: Yes     Based on my current evaluation and risk assessment, patient is determined at this time to be at:  High risk  *RISK ASSESSMENT Risk assessment is a dynamic process; it is possible that this patient's condition, and risk level, may change. This should be re-evaluated and managed over time as appropriate. Please re-consult psychiatric consult services if additional assistance is needed in terms of risk  assessment and management. If your team decides to discharge this patient, please advise the patient  how to best access emergency psychiatric services, or to call 911, if their condition worsens or they feel unsafe in any way.   Rodena Medin, MD [facility]

## 2023-04-29 NOTE — ED Provider Notes (Signed)
Emergency Medicine Observation Re-evaluation Note  Maria Fox is a 78 y.o. female, seen on rounds today.  Pt initially presented to the ED for complaints of Suicidal Currently, the patient is awaiting inpatient care for suicidal ideation.  Physical Exam  BP (!) 163/86   Pulse (!) 101   Temp 97.7 F (36.5 C) (Oral)   Resp 14   SpO2 100%  Physical Exam General: NAD Cardiac: RR Lungs: even unlabored Psych: n/a  ED Course / MDM  EKG:   I have reviewed the labs performed to date as well as medications administered while in observation.  Recent changes in the last 24 hours include evaluated, had CT head, labs, medically cleared.  UA is concerning for ?UTI, ordered keflex.  Plan  Current plan is for inpatient psychiatric admission, treatment for UTI.    Alvira Monday, MD 04/29/23 [phone]

## 2023-04-30 DIAGNOSIS — R45851 Suicidal ideations: Secondary | ICD-10-CM | POA: Diagnosis not present

## 2023-04-30 MED ORDER — RISPERIDONE 2 MG PO TABS: 2.0000 mg | ORAL_TABLET | Freq: Every day | ORAL | 0 refills | Status: DC

## 2023-04-30 MED ORDER — RISPERIDONE 1 MG PO TABS: 1.0000 mg | ORAL_TABLET | Freq: Every morning | ORAL | 0 refills | Status: DC

## 2023-04-30 MED ORDER — CEPHALEXIN 500 MG PO CAPS: 500.0000 mg | ORAL_CAPSULE | Freq: Two times a day (BID) | ORAL | 0 refills | Status: DC

## 2023-04-30 MED ORDER — RISPERIDONE 0.5 MG PO TBDP: 2.0000 mg | ORAL_TABLET | Freq: Every day | ORAL | Status: DC

## 2023-04-30 MED ORDER — RISPERIDONE 0.5 MG PO TBDP: 1.0000 mg | ORAL_TABLET | Freq: Every day | ORAL | Status: DC

## 2023-04-30 NOTE — ED Notes (Signed)
[facility] called pts significant other [name]) again to inform him that pt has been psych cleared and see if he is available to pick up pt. Mr. Judithann Sheen said that he has no working car at this time, only a bicycle. Pts brother is ill and cannot pick pt up either. [facility] agreed to have safe transport bring pt back home. [facility] informed pts SO that pt that there is information in pts discharge papers about contacting the Adult Placement program at [facility] to get a Engineer, civil (consulting) assigned to pt to assist with finding a long term placement for pt. [facility] also included a separate envelope to be sent with pt that included the same resource information.   Jacquelynn Cree, [facility]  04/30/23
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
## 2023-04-30 NOTE — Discharge Instructions (Addendum)
Were seen in the emergency department for your suicidal ideation.  You were seen by psychiatry and were deemed stable for discharge home with outpatient management.  They did recommend changing her Risperdal to take 1 mg tablet in the morning and 2 mg tablets at night and they also recommend that you follow-up with neurology regarding your hallucinations to evaluate for dementia or other causes of the hallucinations.  He did have a urinary tract infection here and I gave you prescription of antibiotics he should complete this as prescribed.  You can follow-up with your primary doctor and outpatient psychiatrist for further management and to return to the emergency department for any new or worsening symptoms.

## 2023-04-30 NOTE — ED Notes (Signed)
[facility] called pts brother to inform him that pt has been psych cleared and to find out who will be picking pt up. [facility] explained that we will be providing resource information at discharge for him and the pts significant other to receive assistance (DSS Adult Placement Program) with finding a long term placement for pt. [facility] left a message to return the call.  Jacquelynn Cree, [facility]  04/30/23

## 2023-04-30 NOTE — ED Provider Notes (Addendum)
Emergency Medicine Observation Re-evaluation Note  Maria Fox is a 78 y.o. female, seen on rounds today.  Pt initially presented to the ED for complaints of Suicidal Currently, the patient is asleep, no concerns by staff.  Physical Exam  BP 129/69 (BP Location: Right Arm)   Pulse 84   Temp 97.6 F (36.4 C) (Oral)   Resp 18   SpO2 98%  Physical Exam General: Asleep, no acute distress Cardiac: Regular rate Lungs: No increased WOB Psych: Calm, asleep  ED Course / MDM  EKG:   I have reviewed the labs performed to date as well as medications administered while in observation.  Recent changes in the last 24 hours include remains medically cleared. Recommended for inpatient psych.  Plan  Current plan is for Inpatient psych.   12:04 PM Patient re-evaluated by psychiatry today. Recommended increase risperdal 1 mg qAM and 2 mg at bedtime for psychosis and recommended outpatient neurology follow up for possible demential eval. She is psychiatrically cleared for discharge and outpatient management.   Venba, Zenner K, DO 04/30/23 1204

## 2023-04-30 NOTE — ED Notes (Signed)
[facility] called pts brother for collateral information and to inquire about progress made on finding pt a long term care placement. Pts brother did not answer and no message could be left due to the voicemail box being full.   [facility] called pts significant other [name]) for collateral information and to inquire about progress made on finding pt a long term care placement. [facility] left a HIPAA compliant message to return the call.  Jacquelynn Cree, [facility]  04/30/23

## 2023-04-30 NOTE — ED Notes (Signed)
[facility] received a call back from pts brother. Pts brother was unaware that pt was here in the [facility] ED. Per pts brother he has been ill and not able to work on finding a long term care placement for pt. [facility] consulted with TOC about resources for pts brother and significant other to utilize in searching for a placement fo the pt. A DSS resource was added to pts AVS and [facility] printed information and contact numbers for the DSS Adult Placement program to have a case manager/social worker assigned to pt.   [facility] is awaiting a return phone call from pts brother to hear back if either he or pts significant other will be picking pt up when she is psych cleared and ready to be discharged.   Jacquelynn Cree, [facility]  04/30/23

## 2023-05-01 ENCOUNTER — Telehealth: Payer: Self-pay | Admitting: *Deleted

## 2023-05-01 LAB — URINE CULTURE: Culture: 30000 — AB

## 2023-05-01 NOTE — Progress Notes (Signed)
Complex Care Management Note  Care Guide Note 05/01/2023 Name: OPHIA SHAMOON MRN: [id number] DOB: Jul 13, 1945  SKILA ROLLINS is a 78 y.o. year old female who sees Eustaquio Boyden, MD for primary care. I reached out to [name] by phone today to offer complex care management services.  Ms. Gladman was given information about Complex Care Management services today including:   The Complex Care Management services include support from the care team which includes your Nurse Care Manager, Clinical Social Worker, or Pharmacist.  The Complex Care Management team is here to help remove barriers to the health concerns and goals most important to you. Complex Care Management services are voluntary, and the patient may decline or stop services at any time by request to their care team member.   Complex Care Management Consent Status: Patient agreed to services and verbal consent obtained.   Follow up plan:  Telephone appointment with complex care management team member scheduled for:  05/05/2023  Encounter Outcome:  Patient Scheduled  Burman Nieves, CMA, Care Guide [facility]  [facility], [facility] Guide Direct Dial: [phone]  Fax: [phone] Website: [email or web address]

## 2023-05-02 ENCOUNTER — Telehealth: Payer: Self-pay

## 2023-05-02 ENCOUNTER — Telehealth (HOSPITAL_BASED_OUTPATIENT_CLINIC_OR_DEPARTMENT_OTHER): Payer: Self-pay

## 2023-05-02 NOTE — Transitions of Care (Post Inpatient/ED Visit) (Signed)
   05/02/2023  Name: BLENDA WISECUP MRN: [id number] DOB: [birth year]  Today's TOC FU Call Status: Today's TOC FU Call Status:: Unsuccessful Call (1st Attempt) Unsuccessful Call (1st Attempt) Date: 05/02/23  Attempted to reach the patient regarding the most recent Inpatient/ED visit.  Follow Up Plan: Additional outreach attempts will be made to reach the patient to complete the Transitions of Care (Post Inpatient/ED visit) call.   Signature Karena Addison, LPN [facility] Nurse Health Advisor Direct Dial [phone]

## 2023-05-02 NOTE — Telephone Encounter (Signed)
Post ED Visit - Positive Culture Follow-up  Culture report reviewed by antimicrobial stewardship pharmacist: [facility] []  Enzo Bi, Pharm.D. []  Celedonio Miyamoto, Pharm.D., BCPS AQ-ID []  Garvin Fila, Pharm.D., BCPS []  Georgina Pillion, [address].D., BCPS []  [place], [place].D., BCPS, AAHIVP []  Estella Husk, Pharm.D., BCPS, AAHIVP []  Lysle Pearl, PharmD, BCPS []  Phillips Climes, PharmD, BCPS []  Agapito Games, PharmD, BCPS []  Verlan Friends, PharmD []  Mervyn Gay, PharmD, BCPS []  Vinnie Level, PharmD  [facility] [x]  Sharin Mons, PharmD []  Greer Pickerel, PharmD []  Adalberto Cole, PharmD []  Perlie Gold, Rph []  Lonell Face) Jean Rosenthal, PharmD []  Earl Many, PharmD []  Junita Push, PharmD []  Dorna Leitz, PharmD []  Terrilee Files, PharmD []  Lynann Beaver, PharmD []  Keturah Barre, PharmD []  Loralee Pacas, PharmD []  Bernadene Person, PharmD   Positive urine culture Treated with Cephalexin, organism sensitive to the same and no further patient follow-up is required at this time.  Sandria Senter 05/02/2023, 9:25 AM

## 2023-05-05 ENCOUNTER — Telehealth: Payer: Self-pay

## 2023-05-05 ENCOUNTER — Ambulatory Visit: Payer: Self-pay | Admitting: *Deleted

## 2023-05-05 NOTE — Progress Notes (Signed)
 Care Guide Pharmacy Note  05/05/2023 Name: Maria Fox MRN: [id number] DOB: 1945/12/08  Referred By: Eustaquio Boyden, MD Reason for referral: Care Coordination (Outreach to schedule with Pharm d )   Maria Fox is a 78 y.o. year old female who is a primary care patient of Eustaquio Boyden, MD.  [name] was referred to the pharmacist for assistance related to: HTN and DMII  An unsuccessful telephone outreach was attempted today to contact the patient who was referred to the pharmacy team for assistance with medication management. Additional attempts will be made to contact the patient.  Penne Lash , RMA     [facility]  [facility], [facility] Guide  Direct Dial: [phone]  Website: [email or web address]

## 2023-05-05 NOTE — Patient Outreach (Signed)
 Care Coordination   05/05/2023 Name: Maria Fox MRN: [id number] DOB: 05-22-1945   Care Coordination Outreach Attempts:  An unsuccessful outreach was attempted for an appointment today.  Follow Up Plan:  Additional outreach attempts will be made to offer the patient complex care management information and services.   Encounter Outcome:  No Answer   Care Coordination Interventions:  No, not indicated    Noland Pizano, LCSW [place]  [facility], [facility] Licensed Clinical Social Worker Care Coordinator  Direct Dial: [phone]

## 2023-05-06 ENCOUNTER — Emergency Department (HOSPITAL_COMMUNITY): Payer: 59

## 2023-05-06 ENCOUNTER — Emergency Department (HOSPITAL_COMMUNITY)
Admission: EM | Admit: 2023-05-06 | Discharge: 2023-05-07 | Disposition: A | Discharge status: Home / Self Care | Payer: 59 | Attending: Emergency Medicine | Admitting: Emergency Medicine

## 2023-05-06 DIAGNOSIS — E1122 Type 2 diabetes mellitus with diabetic chronic kidney disease: Secondary | ICD-10-CM | POA: Diagnosis not present

## 2023-05-06 DIAGNOSIS — R45851 Suicidal ideations: Secondary | ICD-10-CM | POA: Diagnosis not present

## 2023-05-06 DIAGNOSIS — Z7902 Long term (current) use of antithrombotics/antiplatelets: Secondary | ICD-10-CM | POA: Insufficient documentation

## 2023-05-06 DIAGNOSIS — Z765 Malingerer [conscious simulation]: Secondary | ICD-10-CM

## 2023-05-06 DIAGNOSIS — N3000 Acute cystitis without hematuria: Secondary | ICD-10-CM | POA: Insufficient documentation

## 2023-05-06 DIAGNOSIS — N179 Acute kidney failure, unspecified: Secondary | ICD-10-CM | POA: Diagnosis not present

## 2023-05-06 DIAGNOSIS — R4182 Altered mental status, unspecified: Secondary | ICD-10-CM | POA: Diagnosis present

## 2023-05-06 DIAGNOSIS — E1165 Type 2 diabetes mellitus with hyperglycemia: Secondary | ICD-10-CM | POA: Insufficient documentation

## 2023-05-06 DIAGNOSIS — Z7984 Long term (current) use of oral hypoglycemic drugs: Secondary | ICD-10-CM | POA: Insufficient documentation

## 2023-05-06 DIAGNOSIS — R410 Disorientation, unspecified: Secondary | ICD-10-CM | POA: Diagnosis not present

## 2023-05-06 DIAGNOSIS — I10 Essential (primary) hypertension: Secondary | ICD-10-CM | POA: Diagnosis not present

## 2023-05-06 DIAGNOSIS — Z794 Long term (current) use of insulin: Secondary | ICD-10-CM | POA: Insufficient documentation

## 2023-05-06 DIAGNOSIS — R443 Hallucinations, unspecified: Secondary | ICD-10-CM | POA: Diagnosis not present

## 2023-05-06 DIAGNOSIS — I5032 Chronic diastolic (congestive) heart failure: Secondary | ICD-10-CM | POA: Diagnosis not present

## 2023-05-06 DIAGNOSIS — N1832 Chronic kidney disease, stage 3b: Secondary | ICD-10-CM | POA: Diagnosis not present

## 2023-05-06 DIAGNOSIS — R739 Hyperglycemia, unspecified: Secondary | ICD-10-CM

## 2023-05-06 DIAGNOSIS — Z8616 Personal history of COVID-19: Secondary | ICD-10-CM | POA: Insufficient documentation

## 2023-05-06 DIAGNOSIS — Z951 Presence of aortocoronary bypass graft: Secondary | ICD-10-CM | POA: Insufficient documentation

## 2023-05-06 DIAGNOSIS — I130 Hypertensive heart and chronic kidney disease with heart failure and stage 1 through stage 4 chronic kidney disease, or unspecified chronic kidney disease: Secondary | ICD-10-CM | POA: Diagnosis not present

## 2023-05-06 DIAGNOSIS — I2510 Atherosclerotic heart disease of native coronary artery without angina pectoris: Secondary | ICD-10-CM | POA: Insufficient documentation

## 2023-05-06 DIAGNOSIS — Z7901 Long term (current) use of anticoagulants: Secondary | ICD-10-CM | POA: Diagnosis not present

## 2023-05-06 DIAGNOSIS — Z743 Need for continuous supervision: Secondary | ICD-10-CM | POA: Diagnosis not present

## 2023-05-06 DIAGNOSIS — R9431 Abnormal electrocardiogram [ECG] [EKG]: Secondary | ICD-10-CM | POA: Diagnosis not present

## 2023-05-06 LAB — RAPID URINE DRUG SCREEN, HOSP PERFORMED
Amphetamines: NOT DETECTED
Barbiturates: NOT DETECTED
Benzodiazepines: NOT DETECTED
Cocaine: NOT DETECTED
Opiates: NOT DETECTED
Tetrahydrocannabinol: NOT DETECTED

## 2023-05-06 LAB — URINALYSIS, ROUTINE W REFLEX MICROSCOPIC
Bilirubin Urine: NEGATIVE
Glucose, UA: 50 mg/dL — AB
Hgb urine dipstick: NEGATIVE
Ketones, ur: NEGATIVE mg/dL
Nitrite: POSITIVE — AB
Protein, ur: NEGATIVE mg/dL
Specific Gravity, Urine: 1.018 (ref 1.005–1.030)
WBC, UA: >50 WBC/hpf (ref 0–5)
pH: 5 (ref 5.0–8.0)

## 2023-05-06 LAB — COMPREHENSIVE METABOLIC PANEL
ALT: 13 U/L (ref 0–44)
AST: 13 U/L — ABNORMAL LOW (ref 15–41)
Albumin: 4 g/dL (ref 3.5–5.0)
Alkaline Phosphatase: 92 U/L (ref 38–126)
Anion gap: 11 (ref 5–15)
BUN: 26 mg/dL — ABNORMAL HIGH (ref 8–23)
CO2: 19 mmol/L — ABNORMAL LOW (ref 22–32)
Calcium: 9.1 mg/dL (ref 8.9–10.3)
Chloride: 105 mmol/L (ref 98–111)
Creatinine, Ser: 1.56 mg/dL — ABNORMAL HIGH (ref 0.44–1.00)
GFR, Estimated: 33 mL/min — ABNORMAL LOW (ref >60)
Glucose, Bld: 299 mg/dL — ABNORMAL HIGH (ref 70–99)
Potassium: 3.9 mmol/L (ref 3.5–5.1)
Sodium: 135 mmol/L (ref 135–145)
Total Bilirubin: 0.7 mg/dL (ref 0.0–1.2)
Total Protein: 7.4 g/dL (ref 6.5–8.1)

## 2023-05-06 LAB — CBC WITH DIFFERENTIAL/PLATELET
Abs Immature Granulocytes: 0.04 10*3/uL (ref 0.00–0.07)
Basophils Absolute: 0.1 10*3/uL (ref 0.0–0.1)
Basophils Relative: 1 %
Eosinophils Absolute: 0.4 10*3/uL (ref 0.0–0.5)
Eosinophils Relative: 4 %
HCT: 38.1 % (ref 36.0–46.0)
Hemoglobin: 11.9 g/dL — ABNORMAL LOW (ref 12.0–15.0)
Immature Granulocytes: 0 %
Lymphocytes Relative: 28 %
Lymphs Abs: 2.7 10*3/uL (ref 0.7–4.0)
MCH: 28.6 pg (ref 26.0–34.0)
MCHC: 31.2 g/dL (ref 30.0–36.0)
MCV: 91.6 fL (ref 80.0–100.0)
Monocytes Absolute: 0.8 10*3/uL (ref 0.1–1.0)
Monocytes Relative: 8 %
Neutro Abs: 5.9 10*3/uL (ref 1.7–7.7)
Neutrophils Relative %: 59 %
Platelets: 275 10*3/uL (ref 150–400)
RBC: 4.16 MIL/uL (ref 3.87–5.11)
RDW: 13.4 % (ref 11.5–15.5)
WBC: 9.9 10*3/uL (ref 4.0–10.5)
nRBC: 0 % (ref 0.0–0.2)

## 2023-05-06 LAB — ETHANOL: Alcohol, Ethyl (B): <10 mg/dL (ref <10)

## 2023-05-06 LAB — ACETAMINOPHEN LEVEL: Acetaminophen (Tylenol), Serum: <10 ug/mL — ABNORMAL LOW (ref 10–30)

## 2023-05-06 MED ORDER — LACTATED RINGERS IV BOLUS
1000.0000 mL | Freq: Once | INTRAVENOUS | Status: AC
  Administered 2023-05-06: 1000 mL via INTRAVENOUS

## 2023-05-06 MED ORDER — CEPHALEXIN 500 MG PO CAPS
500.0000 mg | ORAL_CAPSULE | Freq: Once | ORAL | Status: AC
  Administered 2023-05-06: 500 mg via ORAL
  Filled 2023-05-06: qty 1

## 2023-05-06 MED ORDER — DIPHENHYDRAMINE HCL 25 MG PO CAPS
25.0000 mg | ORAL_CAPSULE | Freq: Once | ORAL | Status: AC
  Administered 2023-05-06: 25 mg via ORAL
  Filled 2023-05-06: qty 1

## 2023-05-06 NOTE — Transitions of Care (Post Inpatient/ED Visit) (Signed)
   05/06/2023  Name: Maria Fox MRN: [id number] DOB: Aug 16, 1945  Today's TOC FU Call Status: Today's TOC FU Call Status:: Unsuccessful Call (1st Attempt) Unsuccessful Call (1st Attempt) Date: 05/02/23  Attempted to reach the patient regarding the most recent Inpatient/ED visit.  Follow Up Plan: No further outreach attempts will be made at this time. We have been unable to contact the patient. [facility] did TOC Signature Karena Addison, LPN [facility] Nurse Health Advisor Direct Dial [phone]

## 2023-05-06 NOTE — ED Provider Notes (Signed)
 [place] EMERGENCY DEPARTMENT AT [facility] Provider Note   CSN: [id number] Arrival date & time: 05/06/23  1833     History {Add pertinent medical, surgical, social history, OB history to HPI:1} Chief Complaint  Patient presents with   Altered Mental Status    Maria Fox is a 78 y.o. female.  Patient is an 78 year old female with a past medical history of diabetes, CAD, CHF, CKD, depression presents to the emergency department with altered mental status.  Per EMS friend called 911 for the patient electing confused from her baseline.  Patient reports that she has had hallucinations on and off for the last month that have worsened over the last 2 days.  She states that she sees someone in the room that they were talking to her.  She states that the hallucinations have not told her to hurt herself or anyone else.  She states that she has had suicidal thoughts recently with a plan to overdose on her medication but states that she is not done anything yet to harm herself.  Patient also reports that she has had recent dysuria with some suprapubic and low back pain.  She denies any hematuria.  She denies any fevers, nausea or vomiting.  She states that she has not taken any of her medications for the last 2 days. She is agreeable for voluntary pysch eval.  The history is provided by the patient.  Altered Mental Status      Home Medications Prior to Admission medications   Medication Sig Start Date End Date Taking? Authorizing Provider  Accu-Chek Softclix Lancets lancets 1 each by Other route as directed. 03/26/23   [provider]  acetaminophen (TYLENOL) 325 MG tablet Take 2 tablets (650 mg total) by mouth every 6 (six) hours as needed for up to 15 days for headache. 04/21/23 05/06/23  [name], NP  albuterol (PROVENTIL) (2.5 MG/3ML) 0.083% nebulizer solution Inhale 3 mLs into the lungs every 6 (six) hours as needed for wheezing or shortness of  breath. Patient not taking: Reported on 04/28/2023 03/26/23   Verner Chol, MD  albuterol (VENTOLIN HFA) 108 (90 Base) MCG/ACT inhaler Inhale 1-2 puffs into the lungs every 6 (six) hours as needed for wheezing or shortness of breath.    [provider]  apixaban (ELIQUIS) 5 MG TABS tablet Take 1 tablet (5 mg total) by mouth 2 (two) times daily. 11/08/22   Quintella Reichert, MD  atorvastatin (LIPITOR) 40 MG tablet TAKE 1 TABLET BY MOUTH EVERY EVENING 03/06/23   Eustaquio Boyden, MD  Blood Glucose Monitoring Suppl DEVI 1 each by Does not apply route in the morning, at noon, and at bedtime. May substitute to any manufacturer covered by patient's insurance. 03/01/23   Almon Hercules, MD  Blood Glucose Monitoring Suppl DEVI 1 each by Does not apply route in the morning, at noon, and at bedtime. May substitute to any manufacturer covered by patient's insurance. 03/26/23   Verner Chol, MD  cephALEXin (KEFLEX) 500 MG capsule Take 1 capsule (500 mg total) by mouth 2 (two) times daily. 04/30/23   Rexford Maus, DO  clopidogrel (PLAVIX) 75 MG tablet Take 1 tablet (75 mg total) by mouth daily. 03/27/23   Verner Chol, MD  dorzolamide-timolol (COSOPT) 2-0.5 % ophthalmic solution Place 1 drop into both eyes 2 (two) times daily. 04/24/23   [provider]  DULoxetine (CYMBALTA) 60 MG capsule Take 1 capsule (60 mg total) by mouth daily. 04/08/23  Bethann Berkshire, MD  feeding supplement, GLUCERNA SHAKE, (GLUCERNA SHAKE) LIQD Take 237 mLs by mouth 3 (three) times daily between meals. 03/26/23   Verner Chol, MD  gabapentin (NEURONTIN) 300 MG capsule Take 300 mg by mouth 2 (two) times daily.    [provider]  hydrocortisone cream 1 % Apply topically 3 (three) times daily. 03/26/23   Verner Chol, MD  insulin glargine (LANTUS) 100 UNIT/ML Solostar Pen Inject 20 Units into the skin daily. Patient not taking: Reported on 04/28/2023 03/25/23   Emeline General, MD  Insulin Pen Needle (PEN  NEEDLES) 32G X 6 MM MISC Use 1 pen needle daily to inject insulin. 03/01/23   Almon Hercules, MD  isosorbide mononitrate (IMDUR) 30 MG 24 hr tablet Take 0.5 tablets (15 mg total) by mouth daily. 03/27/23   Verner Chol, MD  latanoprost (XALATAN) 0.005 % ophthalmic solution Place 1 drop into both eyes at bedtime. 03/26/23   Verner Chol, MD  linagliptin (TRADJENTA) 5 MG TABS tablet Take 1 tablet (5 mg total) by mouth daily. Patient not taking: Reported on 04/28/2023 03/25/23   Emeline General, MD  metFORMIN (GLUCOPHAGE-XR) 500 MG 24 hr tablet Take 1 tablet (500 mg total) by mouth daily with breakfast. 04/25/23   Eustaquio Boyden, MD  metoprolol succinate (TOPROL-XL) 25 MG 24 hr tablet Take 1 tablet (25 mg total) by mouth daily. 03/27/23   Verner Chol, MD  nitroGLYCERIN (NITROSTAT) 0.4 MG SL tablet Place 0.4 mg under the tongue every 5 (five) minutes as needed for chest pain. 04/04/23   [provider]  ondansetron (ZOFRAN) 4 MG tablet Take 4 mg by mouth every 8 (eight) hours as needed for nausea or vomiting.    [provider]  pantoprazole (PROTONIX) 40 MG tablet Take 1 tablet (40 mg total) by mouth daily. Patient taking differently: Take 40 mg by mouth 2 (two) times daily as needed (for reflux- 30 minutes before a meal). 03/27/23   Verner Chol, MD  phenazopyridine (PYRIDIUM) 100 MG tablet Take 100 mg by mouth See admin instructions. Take 100 mg by mouth once a day and 100 mg once daily as needed for burning    [provider]  QUEtiapine (SEROQUEL) 50 MG tablet Take 50 mg by mouth at bedtime.    [provider]  risperiDONE (RISPERDAL) 1 MG tablet Take 1 tablet (1 mg total) by mouth in the morning. 04/30/23   [name] Snare K, DO  risperiDONE (RISPERDAL) 2 MG tablet Take 1 tablet (2 mg total) by mouth at bedtime. 04/30/23   Rexford Maus, DO  Saccharomyces boulardii (PROBIOTIC) 250 MG CAPS Take 250 mg by mouth daily.    [provider]   traZODone (DESYREL) 50 MG tablet Take 1 tablet (50 mg total) by mouth at bedtime. 04/08/23   Bethann Berkshire, MD      Allergies    Bee venom, Mushroom extract complex (obsolete), Penicillins, Codeine, Sulfa antibiotics, Iohexol, and Erythromycin base    Review of Systems   Review of Systems  Physical Exam Updated Vital Signs BP 139/77   Pulse 81   Temp 97.8 F (36.6 C) (Oral)   Resp 20   SpO2 100%  Physical Exam Vitals and nursing note reviewed.  Constitutional:      General: She is not in acute distress.    Appearance: Normal appearance.  HENT:     Head: Normocephalic and atraumatic.     Nose: Nose normal.     Mouth/Throat:  Mouth: Mucous membranes are moist.     Pharynx: Oropharynx is clear.  Eyes:     Extraocular Movements: Extraocular movements intact.     Conjunctiva/sclera: Conjunctivae normal.     Pupils: Pupils are equal, round, and reactive to light.  Cardiovascular:     Rate and Rhythm: Normal rate and regular rhythm.     Heart sounds: Normal heart sounds.  Pulmonary:     Effort: Pulmonary effort is normal.     Breath sounds: Normal breath sounds.  Abdominal:     General: Abdomen is flat.     Palpations: Abdomen is soft.     Tenderness: There is no abdominal tenderness.  Musculoskeletal:        General: Normal range of motion.     Cervical back: Normal range of motion and neck supple.  Skin:    General: Skin is warm and dry.  Neurological:     General: No focal deficit present.     Mental Status: She is alert.     Cranial Nerves: No cranial nerve deficit.     Sensory: No sensory deficit.     Motor: No weakness.     Comments: Oriented to person and place only  Psychiatric:        Mood and Affect: Mood normal.        Behavior: Behavior normal.     ED Results / Procedures / Treatments   Labs (all labs ordered are listed, but only abnormal results are displayed) Labs Reviewed  CBC WITH DIFFERENTIAL/PLATELET - Abnormal; Notable for the following  components:      Result Value   Hemoglobin 11.9 (*)    All other components within normal limits  COMPREHENSIVE METABOLIC PANEL - Abnormal; Notable for the following components:   CO2 19 (*)    Glucose, Bld 299 (*)    BUN 26 (*)    Creatinine, Ser 1.56 (*)    AST 13 (*)    GFR, Estimated 33 (*)    All other components within normal limits  ACETAMINOPHEN LEVEL - Abnormal; Notable for the following components:   Acetaminophen (Tylenol), Serum <10 (*)    All other components within normal limits  ETHANOL  URINALYSIS, ROUTINE W REFLEX MICROSCOPIC  RAPID URINE DRUG SCREEN, HOSP PERFORMED    EKG EKG Interpretation Date/Time:  Tuesday May 06 2023 21:41:53 EST Ventricular Rate:  75 PR Interval:  164 QRS Duration:  133 QT Interval:  426 QTC Calculation: 454 R Axis:   -66  Text Interpretation: Sinus rhythm Supraventricular bigeminy RBBB and LAFB LVH with secondary repolarization abnormality No acute changes Confirmed by [name] Snare ([id number]) on 05/06/2023 9:56:16 PM  Radiology No results found.  Procedures Procedures  {Document cardiac monitor, telemetry assessment procedure when appropriate:1}  Medications Ordered in ED Medications  lactated ringers bolus 1,000 mL (1,000 mLs Intravenous Bolus 05/06/23 2154)  diphenhydrAMINE (BENADRYL) capsule 25 mg (25 mg Oral Given 05/06/23 2158)    ED Course/ Medical Decision Making/ A&P   {   Click here for ABCD2, HEART and other calculatorsREFRESH Note before signing :1}                              Medical Decision Making This patient presents to the ED with chief complaint(s) of AMS, SI with pertinent past medical history of CAD, CVA, CKD, MDD which further complicates the presenting complaint. The complaint involves an extensive differential diagnosis and also carries with  it a high risk of complications and morbidity.    The differential diagnosis includes new onset dementia, worsening depression, SI, ICH, mass effect, no  focal neurologic deficits making a CVA or TIA less likely, hyper or hypoglycemia, electrolyte abnormality, infection  Additional history obtained: Additional history obtained from EMS  Records reviewed recent psychiatry evaluation  ED Course and Reassessment: On patient's arrival she is hemodynamically stable in no acute distress.  Presentation does appear somewhat similar to when she was in the ED about 1 week ago.  She was initially evaluated in triage and had labs performed that did show hyperglycemia without evidence of DKA and a mild increase of her creatinine from her baseline.  She will be given fluids for her mild AKI and her hyperglycemia.  Patient was also complaining of itching and was given Benadryl.  Will additionally have head CT and urine performed in the setting of her urinary symptoms.  She was placed on suicide precautions and will be closely reassessed.  She would like to be voluntary for psychiatry evaluation at this time.  Independent labs interpretation:  The following labs were independently interpreted: ***  Independent visualization of imaging: - I independently visualized the following imaging with scope of interpretation limited to determining acute life threatening conditions related to emergency care: ***, which revealed ***  Consultation: - Consulted or discussed management/test interpretation w/ external professional: ***  Consideration for admission or further workup: *** Social Determinants of health: ***    Amount and/or Complexity of Data Reviewed Radiology: ordered.   ***  {Document critical care time when appropriate:1} {Document review of labs and clinical decision tools ie heart score, Chads2Vasc2 etc:1}  {Document your independent review of radiology images, and any outside records:1} {Document your discussion with family members, caretakers, and with consultants:1} {Document social determinants of health affecting pt's care:1} {Document your  decision making why or why not admission, treatments were needed:1} Final Clinical Impression(s) / ED Diagnoses Final diagnoses:  None    Rx / DC Orders ED Discharge Orders     None

## 2023-05-06 NOTE — ED Provider Triage Note (Signed)
 Emergency Medicine Provider Triage Evaluation Note  Maria Fox , a 78 y.o. female  was evaluated in triage.  Pt complains of dysuria.  Patient reportedly states that about 2 days ago she began to notice increased pain with urination and some increasing frequency.  Does endorse some frequent UTIs.  States that she has also for several having thoughts of harming herself.  No prior history of SI, HI, or prior suicide attempts.  States that she would try to take her pills if she had access to them.  Has not been treated for UTI for this current episode.  Review of Systems  Positive: As above Negative: As above  Physical Exam  BP (!) 145/65 (BP Location: Left Arm)   Pulse 89   Temp (!) 97.5 F (36.4 C) (Oral)   Resp 15   SpO2 100%  Gen:   Awake, no distress   Resp:  Normal effort  MSK:   Moves extremities without difficulty  Other:  Mild suprapubic tenderness  Medical Decision Making  Medically screening exam initiated at 7:15 PM.  Appropriate orders placed.  Maria Fox was informed that the remainder of the evaluation will be completed by another provider, this initial triage assessment does not replace that evaluation, and the importance of remaining in the ED until their evaluation is complete.     Maria Knudsen, PA-C 05/06/23 1916

## 2023-05-06 NOTE — ED Triage Notes (Signed)
 Pt arrives via [facility] from home for AMS. Pt reportedly lives with a friend who has reported that she stopped taking her medications two days ago and has been having increased confusion. Pt also reports painful urination and decreased frequency. Hx of frequent UTIs. Pt A+Ox3 (missing time). She did report to EMS suicidal thoughts of wanting to take all of her medications at once.

## 2023-05-07 ENCOUNTER — Emergency Department (HOSPITAL_COMMUNITY): Payer: 59

## 2023-05-07 DIAGNOSIS — R443 Hallucinations, unspecified: Secondary | ICD-10-CM

## 2023-05-07 DIAGNOSIS — R410 Disorientation, unspecified: Secondary | ICD-10-CM | POA: Diagnosis not present

## 2023-05-07 DIAGNOSIS — Z7401 Bed confinement status: Secondary | ICD-10-CM | POA: Diagnosis not present

## 2023-05-07 DIAGNOSIS — E1165 Type 2 diabetes mellitus with hyperglycemia: Secondary | ICD-10-CM | POA: Diagnosis not present

## 2023-05-07 DIAGNOSIS — Z765 Malingerer [conscious simulation]: Secondary | ICD-10-CM

## 2023-05-07 DIAGNOSIS — R0902 Hypoxemia: Secondary | ICD-10-CM | POA: Diagnosis not present

## 2023-05-07 DIAGNOSIS — Z743 Need for continuous supervision: Secondary | ICD-10-CM | POA: Diagnosis not present

## 2023-05-07 DIAGNOSIS — N3000 Acute cystitis without hematuria: Secondary | ICD-10-CM | POA: Diagnosis not present

## 2023-05-07 MED ORDER — APIXABAN 5 MG PO TABS
5.0000 mg | ORAL_TABLET | Freq: Two times a day (BID) | ORAL | Status: DC
  Administered 2023-05-07: 5 mg via ORAL
  Filled 2023-05-07 (×2): qty 1

## 2023-05-07 MED ORDER — DULOXETINE HCL 30 MG PO CPEP
60.0000 mg | ORAL_CAPSULE | Freq: Every day | ORAL | Status: DC
  Administered 2023-05-07: 60 mg via ORAL
  Filled 2023-05-07: qty 2

## 2023-05-07 MED ORDER — CEPHALEXIN 500 MG PO CAPS: 500.0000 mg | ORAL_CAPSULE | Freq: Four times a day (QID) | ORAL | 0 refills | Status: DC

## 2023-05-07 MED ORDER — TRAZODONE HCL 50 MG PO TABS
50.0000 mg | ORAL_TABLET | Freq: Every day | ORAL | Status: DC
  Administered 2023-05-07: 50 mg via ORAL
  Filled 2023-05-07: qty 1

## 2023-05-07 MED ORDER — RISPERIDONE 1 MG PO TABS
1.0000 mg | ORAL_TABLET | Freq: Every morning | ORAL | Status: DC
  Administered 2023-05-07: 1 mg via ORAL
  Filled 2023-05-07: qty 2

## 2023-05-07 MED ORDER — ATORVASTATIN CALCIUM 40 MG PO TABS: 40.0000 mg | ORAL_TABLET | Freq: Every evening | ORAL | Status: DC

## 2023-05-07 MED ORDER — CEPHALEXIN 500 MG PO CAPS
500.0000 mg | ORAL_CAPSULE | Freq: Three times a day (TID) | ORAL | Status: DC
  Administered 2023-05-07 (×2): 500 mg via ORAL
  Filled 2023-05-07 (×2): qty 1

## 2023-05-07 MED ORDER — ACETAMINOPHEN 325 MG PO TABS: 650.0000 mg | ORAL_TABLET | Freq: Four times a day (QID) | ORAL | Status: DC | PRN

## 2023-05-07 MED ORDER — METFORMIN HCL ER 500 MG PO TB24
500.0000 mg | ORAL_TABLET | Freq: Every day | ORAL | Status: DC
  Administered 2023-05-07: 500 mg via ORAL
  Filled 2023-05-07: qty 1

## 2023-05-07 MED ORDER — RISPERIDONE 0.5 MG PO TABS: 2.0000 mg | ORAL_TABLET | Freq: Every day | ORAL | Status: DC

## 2023-05-07 MED ORDER — METOPROLOL SUCCINATE ER 50 MG PO TB24
25.0000 mg | ORAL_TABLET | Freq: Every day | ORAL | Status: DC
  Administered 2023-05-07: 25 mg via ORAL
  Filled 2023-05-07: qty 1

## 2023-05-07 MED ORDER — CLOPIDOGREL BISULFATE 75 MG PO TABS
75.0000 mg | ORAL_TABLET | Freq: Every day | ORAL | Status: DC
  Administered 2023-05-07: 75 mg via ORAL
  Filled 2023-05-07 (×2): qty 1

## 2023-05-07 MED ORDER — GABAPENTIN 300 MG PO CAPS
300.0000 mg | ORAL_CAPSULE | Freq: Two times a day (BID) | ORAL | Status: DC
Start: 2023-05-07 — End: 2023-05-07
  Administered 2023-05-07 (×2): 300 mg via ORAL
  Filled 2023-05-07 (×2): qty 1
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
## 2023-05-07 NOTE — ED Provider Notes (Signed)
 I assumed care of the patient at 0 700.  Briefly the patient is a 78 year old female who is awaiting TTS evaluation for suicidality.  Patient was seen by TTS and initially recommended medical admission.  Patient had been already medically cleared and so there was some confusion, this was discussed and eventually decided that she was also psychiatrically cleared.  Patient will be discharged home.  Found to have a urinary tract infection on lab work here.  Continue antibiotics at home.   Melene Plan, DO 05/07/23 1450

## 2023-05-07 NOTE — ED Provider Notes (Signed)
 I assumed care of this patient from previous provider.  Please see their note for further details of history, exam, and MDM.   Briefly patient is a 78 y.o. female who presented for altered mental status.  Noted to have urinary tract infection.  Will be treated. currently awaiting CT head.  Patient also reportedly suicidal.  If CT head negative, TTS consultation recommended.  CT head negative.  Home meds ordered. TTS consulted.      Nira Conn, MD 05/07/23 0130

## 2023-05-07 NOTE — Discharge Instructions (Signed)
 Take the antibiotics as prescribed.  Please follow with your family doctor in the office.

## 2023-05-07 NOTE — BH Assessment (Signed)
 TTS Consult to be completed by IRIS. IRIS Coordinator will communicate in established secure chat assessment time and provider. Thanks

## 2023-05-07 NOTE — ED Notes (Signed)
 Spoke to  Sparks,[name] (Significant other) [phone] (Home Phone)  To confirm someone would be at Pt home for trx.

## 2023-05-09 ENCOUNTER — Other Ambulatory Visit: Payer: Self-pay

## 2023-05-09 ENCOUNTER — Encounter (HOSPITAL_COMMUNITY): Payer: Self-pay

## 2023-05-09 ENCOUNTER — Emergency Department (HOSPITAL_COMMUNITY)
Admission: EM | Admit: 2023-05-09 | Discharge: 2023-05-12 | Disposition: A | Discharge status: Home / Self Care | Payer: 59 | Attending: Emergency Medicine | Admitting: Emergency Medicine

## 2023-05-09 ENCOUNTER — Telehealth: Payer: Self-pay | Admitting: *Deleted

## 2023-05-09 DIAGNOSIS — R443 Hallucinations, unspecified: Secondary | ICD-10-CM | POA: Diagnosis present

## 2023-05-09 DIAGNOSIS — Z765 Malingerer [conscious simulation]: Secondary | ICD-10-CM

## 2023-05-09 DIAGNOSIS — R4182 Altered mental status, unspecified: Secondary | ICD-10-CM

## 2023-05-09 DIAGNOSIS — F333 Major depressive disorder, recurrent, severe with psychotic symptoms: Secondary | ICD-10-CM | POA: Diagnosis present

## 2023-05-09 DIAGNOSIS — R45851 Suicidal ideations: Secondary | ICD-10-CM

## 2023-05-09 DIAGNOSIS — I10 Essential (primary) hypertension: Secondary | ICD-10-CM | POA: Diagnosis not present

## 2023-05-09 LAB — CBG MONITORING, ED: Glucose-Capillary: 174 mg/dL — ABNORMAL HIGH (ref 70–99)

## 2023-05-09 MED ORDER — CLOPIDOGREL BISULFATE 75 MG PO TABS
75.0000 mg | ORAL_TABLET | Freq: Every day | ORAL | Status: DC
Start: 2023-05-10 — End: 2023-05-12
  Administered 2023-05-10 – 2023-05-11 (×2): 75 mg via ORAL
  Filled 2023-05-09 (×3): qty 1

## 2023-05-09 MED ORDER — ATORVASTATIN CALCIUM 40 MG PO TABS
40.0000 mg | ORAL_TABLET | Freq: Every evening | ORAL | Status: DC
Start: 2023-05-09 — End: 2023-05-09
  Filled 2023-05-09: qty 1

## 2023-05-09 MED ORDER — GLIPIZIDE 10 MG PO TABS
10.0000 mg | ORAL_TABLET | Freq: Two times a day (BID) | ORAL | Status: DC
  Administered 2023-05-09 – 2023-05-11 (×4): 10 mg via ORAL
  Filled 2023-05-09 (×6): qty 1

## 2023-05-09 MED ORDER — GABAPENTIN 300 MG PO CAPS
300.0000 mg | ORAL_CAPSULE | Freq: Two times a day (BID) | ORAL | Status: DC
  Administered 2023-05-09 – 2023-05-11 (×4): 300 mg via ORAL
  Filled 2023-05-09 (×5): qty 1

## 2023-05-09 MED ORDER — METOPROLOL SUCCINATE ER 50 MG PO TB24
25.0000 mg | ORAL_TABLET | Freq: Every day | ORAL | Status: DC
  Administered 2023-05-10 – 2023-05-11 (×2): 25 mg via ORAL
  Filled 2023-05-09 (×3): qty 1

## 2023-05-09 MED ORDER — CEPHALEXIN 500 MG PO CAPS
500.0000 mg | ORAL_CAPSULE | Freq: Three times a day (TID) | ORAL | Status: DC
  Administered 2023-05-10 – 2023-05-11 (×4): 500 mg via ORAL
  Filled 2023-05-09 (×4): qty 1

## 2023-05-09 MED ORDER — METFORMIN HCL ER 500 MG PO TB24
500.0000 mg | ORAL_TABLET | Freq: Every day | ORAL | Status: DC
  Filled 2023-05-09 (×2): qty 1

## 2023-05-09 MED ORDER — ALBUTEROL SULFATE HFA 108 (90 BASE) MCG/ACT IN AERS: 1.0000 | INHALATION_SPRAY | Freq: Four times a day (QID) | RESPIRATORY_TRACT | Status: DC | PRN

## 2023-05-09 MED ORDER — TRAZODONE HCL 50 MG PO TABS
50.0000 mg | ORAL_TABLET | Freq: Every day | ORAL | Status: DC
Start: 2023-05-09 — End: 2023-05-12
  Administered 2023-05-09 – 2023-05-11 (×3): 50 mg via ORAL
  Filled 2023-05-09 (×3): qty 1

## 2023-05-09 MED ORDER — RISPERIDONE 2 MG PO TABS
2.0000 mg | ORAL_TABLET | Freq: Every day | ORAL | Status: DC
Start: 2023-05-09 — End: 2023-05-12
  Administered 2023-05-09 – 2023-05-11 (×3): 2 mg via ORAL
  Filled 2023-05-09 (×3): qty 1

## 2023-05-09 MED ORDER — CEPHALEXIN 500 MG PO CAPS
500.0000 mg | ORAL_CAPSULE | Freq: Four times a day (QID) | ORAL | Status: DC
  Administered 2023-05-09: 500 mg via ORAL
  Filled 2023-05-09: qty 1

## 2023-05-09 MED ORDER — PANTOPRAZOLE SODIUM 40 MG PO TBEC: 40.0000 mg | DELAYED_RELEASE_TABLET | Freq: Two times a day (BID) | ORAL | Status: DC | PRN

## 2023-05-09 MED ORDER — APIXABAN 2.5 MG PO TABS
2.5000 mg | ORAL_TABLET | Freq: Two times a day (BID) | ORAL | Status: DC
  Administered 2023-05-09 – 2023-05-11 (×4): 2.5 mg via ORAL
  Filled 2023-05-09 (×6): qty 1

## 2023-05-09 MED ORDER — ATORVASTATIN CALCIUM 40 MG PO TABS
40.0000 mg | ORAL_TABLET | Freq: Every evening | ORAL | Status: DC
  Administered 2023-05-09 – 2023-05-11 (×3): 40 mg via ORAL
  Filled 2023-05-09 (×3): qty 1

## 2023-05-09 MED ORDER — METOPROLOL SUCCINATE ER 50 MG PO TB24
25.0000 mg | ORAL_TABLET | Freq: Every day | ORAL | Status: DC
  Filled 2023-05-09: qty 1

## 2023-05-09 MED ORDER — LINAGLIPTIN 5 MG PO TABS: 5.0000 mg | ORAL_TABLET | Freq: Every day | ORAL | Status: DC

## 2023-05-09 MED ORDER — QUETIAPINE FUMARATE 50 MG PO TABS
50.0000 mg | ORAL_TABLET | Freq: Every day | ORAL | Status: DC
  Administered 2023-05-09 – 2023-05-11 (×3): 50 mg via ORAL
  Filled 2023-05-09 (×3): qty 1

## 2023-05-09 MED ORDER — RISPERIDONE 1 MG PO TABS
1.0000 mg | ORAL_TABLET | Freq: Every morning | ORAL | Status: DC
Start: 2023-05-10 — End: 2023-05-12
  Administered 2023-05-10 – 2023-05-11 (×2): 1 mg via ORAL
  Filled 2023-05-09 (×2): qty 1

## 2023-05-09 MED ORDER — DULOXETINE HCL 30 MG PO CPEP
60.0000 mg | ORAL_CAPSULE | Freq: Every day | ORAL | Status: DC
  Administered 2023-05-10 – 2023-05-11 (×2): 60 mg via ORAL
  Filled 2023-05-09 (×3): qty 2

## 2023-05-09 MED ORDER — DULOXETINE HCL 30 MG PO CPEP
60.0000 mg | ORAL_CAPSULE | Freq: Every day | ORAL | Status: DC
Start: 2023-05-09 — End: 2023-05-09
  Filled 2023-05-09: qty 2

## 2023-05-09 MED ORDER — ACETAMINOPHEN 325 MG PO TABS
650.0000 mg | ORAL_TABLET | Freq: Once | ORAL | Status: AC
  Administered 2023-05-09: 650 mg via ORAL
  Filled 2023-05-09: qty 2

## 2023-05-09 MED ORDER — ONDANSETRON HCL 4 MG PO TABS: 4.0000 mg | ORAL_TABLET | Freq: Three times a day (TID) | ORAL | Status: DC | PRN

## 2023-05-09 MED ORDER — APIXABAN 5 MG PO TABS: 5.0000 mg | ORAL_TABLET | Freq: Two times a day (BID) | ORAL | Status: DC

## 2023-05-09 MED ORDER — ALBUTEROL SULFATE (2.5 MG/3ML) 0.083% IN NEBU: 3.0000 mL | INHALATION_SOLUTION | Freq: Four times a day (QID) | RESPIRATORY_TRACT | Status: DC | PRN

## 2023-05-09 NOTE — ED Provider Notes (Signed)
 [place] EMERGENCY DEPARTMENT AT [facility] Provider Note   CSN: [id number] Arrival date & time: 05/09/23  1719     History  Chief Complaint  Patient presents with   Suicidal    Maria Fox is a 78 y.o. female.  HPI   78 year old female presents emergency department with concern for suicidal ideations.  She states that she wants to kill herself, she is tired of feeling sick and weak all the time.  If she does it she says her plan is to take all of her pills to try to kill herself but states that she currently does not have any access to her medications.  She denies any other acute change in her health, no recent fever, chest pain, abdominal pain, vomiting or diarrhea.  Home Medications Prior to Admission medications   Medication Sig Start Date End Date Taking? Authorizing Provider  Accu-Chek Softclix Lancets lancets 1 each by Other route as directed. 03/26/23   [provider]  albuterol (PROVENTIL) (2.5 MG/3ML) 0.083% nebulizer solution Inhale 3 mLs into the lungs every 6 (six) hours as needed for wheezing or shortness of breath. Patient not taking: Reported on 04/28/2023 03/26/23   Verner Chol, MD  albuterol (VENTOLIN HFA) 108 (90 Base) MCG/ACT inhaler Inhale 1-2 puffs into the lungs every 6 (six) hours as needed for wheezing or shortness of breath.    [provider]  apixaban (ELIQUIS) 5 MG TABS tablet Take 1 tablet (5 mg total) by mouth 2 (two) times daily. 11/08/22   Quintella Reichert, MD  atorvastatin (LIPITOR) 40 MG tablet TAKE 1 TABLET BY MOUTH EVERY EVENING 03/06/23   Eustaquio Boyden, MD  Blood Glucose Monitoring Suppl DEVI 1 each by Does not apply route in the morning, at noon, and at bedtime. May substitute to any manufacturer covered by patient's insurance. 03/26/23   Verner Chol, MD  cephALEXin (KEFLEX) 500 MG capsule Take 1 capsule (500 mg total) by mouth 4 (four) times daily. 05/07/23   Melene Plan, DO  clopidogrel (PLAVIX) 75 MG  tablet Take 1 tablet (75 mg total) by mouth daily. 03/27/23   Verner Chol, MD  dorzolamide-timolol (COSOPT) 2-0.5 % ophthalmic solution Place 1 drop into both eyes 2 (two) times daily. 04/24/23   [provider]  DULoxetine (CYMBALTA) 60 MG capsule Take 1 capsule (60 mg total) by mouth daily. 04/08/23   Bethann Berkshire, MD  feeding supplement, GLUCERNA SHAKE, (GLUCERNA SHAKE) LIQD Take 237 mLs by mouth 3 (three) times daily between meals. 03/26/23   Verner Chol, MD  gabapentin (NEURONTIN) 300 MG capsule Take 300 mg by mouth 2 (two) times daily.    [provider]  glipiZIDE (GLUCOTROL) 10 MG tablet Take 10 mg by mouth 2 (two) times daily. 05/05/23   [provider]  hydrocortisone cream 1 % Apply topically 3 (three) times daily. 03/26/23   Verner Chol, MD  insulin glargine (LANTUS) 100 UNIT/ML Solostar Pen Inject 20 Units into the skin daily. Patient not taking: Reported on 04/28/2023 03/25/23   Emeline General, MD  Insulin Pen Needle (PEN NEEDLES) 32G X 6 MM MISC Use 1 pen needle daily to inject insulin. 03/01/23   Almon Hercules, MD  isosorbide mononitrate (IMDUR) 30 MG 24 hr tablet Take 0.5 tablets (15 mg total) by mouth daily. 03/27/23   Verner Chol, MD  latanoprost (XALATAN) 0.005 % ophthalmic solution Place 1 drop into both eyes at bedtime. 03/26/23   Verner Chol, MD  linagliptin (TRADJENTA) 5  MG TABS tablet Take 1 tablet (5 mg total) by mouth daily. Patient not taking: Reported on 04/28/2023 03/25/23   Emeline General, MD  metFORMIN (GLUCOPHAGE-XR) 500 MG 24 hr tablet Take 1 tablet (500 mg total) by mouth daily with breakfast. 04/25/23   Eustaquio Boyden, MD  metoprolol succinate (TOPROL-XL) 25 MG 24 hr tablet Take 1 tablet (25 mg total) by mouth daily. 03/27/23   Verner Chol, MD  nitroGLYCERIN (NITROSTAT) 0.4 MG SL tablet Place 0.4 mg under the tongue every 5 (five) minutes as needed for chest pain. 04/04/23   [provider]  ondansetron (ZOFRAN) 4 MG  tablet Take 4 mg by mouth every 8 (eight) hours as needed for nausea or vomiting.    [provider]  pantoprazole (PROTONIX) 40 MG tablet Take 1 tablet (40 mg total) by mouth daily. Patient taking differently: Take 40 mg by mouth 2 (two) times daily as needed (for reflux- 30 minutes before a meal). 03/27/23   Verner Chol, MD  phenazopyridine (PYRIDIUM) 100 MG tablet Take 100 mg by mouth See admin instructions. Take 100 mg by mouth once a day and 100 mg once daily as needed for burning    [provider]  QUEtiapine (SEROQUEL) 50 MG tablet Take 50 mg by mouth at bedtime.    [provider]  risperiDONE (RISPERDAL) 1 MG tablet Take 1 tablet (1 mg total) by mouth in the morning. 04/30/23   [name] Snare K, DO  risperiDONE (RISPERDAL) 2 MG tablet Take 1 tablet (2 mg total) by mouth at bedtime. 04/30/23   Rexford Maus, DO  Saccharomyces boulardii (PROBIOTIC) 250 MG CAPS Take 250 mg by mouth daily.    [provider]  traZODone (DESYREL) 50 MG tablet Take 1 tablet (50 mg total) by mouth at bedtime. 04/08/23   Bethann Berkshire, MD      Allergies    Bee venom, Mushroom extract complex (obsolete), Penicillins, Codeine, Sulfa antibiotics, Iohexol, and Erythromycin base    Review of Systems   Review of Systems  Constitutional:  Negative for fever.  Respiratory:  Negative for shortness of breath.   Cardiovascular:  Negative for chest pain.  Gastrointestinal:  Negative for abdominal pain.  Neurological:  Negative for headaches.  Psychiatric/Behavioral:  Positive for suicidal ideas. Negative for hallucinations.     Physical Exam Updated Vital Signs BP (!) 140/79 (BP Location: Right Arm)   Pulse 80   Temp 98 F (36.7 C) (Oral)   Resp 17   Ht 5\' 3"  (1.6 m)   Wt 61.4 kg   SpO2 100%   BMI 23.98 kg/m  Physical Exam Vitals and nursing note reviewed.  Constitutional:      General: She is not in acute distress.    Appearance: Normal appearance.   HENT:     Head: Normocephalic.     Mouth/Throat:     Mouth: Mucous membranes are moist.  Eyes:     Pupils: Pupils are equal, round, and reactive to light.  Cardiovascular:     Rate and Rhythm: Normal rate.  Pulmonary:     Effort: Pulmonary effort is normal. No respiratory distress.  Abdominal:     Palpations: Abdomen is soft.     Tenderness: There is no abdominal tenderness.  Skin:    General: Skin is warm.  Neurological:     Mental Status: She is alert and oriented to person, place, and time. Mental status is at baseline.  Psychiatric:  Mood and Affect: Mood normal.     ED Results / Procedures / Treatments   Labs (all labs ordered are listed, but only abnormal results are displayed) Labs Reviewed  CBG MONITORING, ED - Abnormal; Notable for the following components:      Result Value   Glucose-Capillary 174 (*)    All other components within normal limits  RAPID URINE DRUG SCREEN, HOSP PERFORMED    EKG None  Radiology No results found.  Procedures Procedures    Medications Ordered in ED Medications - No data to display  ED Course/ Medical Decision Making/ A&P                                 Medical Decision Making Amount and/or Complexity of Data Reviewed Labs: ordered.   78 year old female presents emergency department with suicidal ideation.  She otherwise denies any acute changes in her health.  Vitals are normal stable on arrival.  Patient was seen for similar complaints less than 72 hours ago, normal labs at that time.  CBG today is normal, EKG shows no acute finding.  Will place a UDS for further evaluation but otherwise plan for TTS evaluation.  Patient otherwise stable from a medical perspective.        Final Clinical Impression(s) / ED Diagnoses Final diagnoses:  None    Rx / DC Orders ED Discharge Orders     None         Rozelle Logan, DO 05/09/23 1840

## 2023-05-09 NOTE — Progress Notes (Signed)
 Complex Care Management Care Guide Note  05/09/2023 Name: [name] MRN: [id number] DOB: 1945/05/13  Maria Fox is a 78 y.o. year old female who is a primary care patient of Eustaquio Boyden, MD and is actively engaged with the care management team. I reached out to [name] by phone today to assist with re-scheduling  with the Licensed Clinical Child psychotherapist.  Follow up plan: Unsuccessful telephone outreach attempt made. A HIPAA compliant phone message was left for the patient providing contact information and requesting a return call.  Burman Nieves, CMA, Care Guide [facility]  [facility], [facility] Guide Direct Dial: [phone]  Fax: [phone] Website: [email or web address]

## 2023-05-09 NOTE — ED Triage Notes (Signed)
 Patient is here for evaluation of suicidal ideations. Reports she wants to kill herself because she is tired of being sick. States she will take all of her pills to kill herself, "unless my brother has hidden all my medicine." Patient seen for the same the past few days, states she feels as though she has not received any help.

## 2023-05-09 NOTE — Progress Notes (Signed)
 PHARMACY NOTE:  ANTIMICROBIAL RENAL DOSAGE ADJUSTMENT  Current antimicrobial regimen includes a mismatch between antimicrobial dosage and estimated renal function.  As per policy approved by the Pharmacy & Therapeutics and Medical Executive Committees, the antimicrobial dosage will be adjusted accordingly.  Current antimicrobial dosage:  Cephalexin 500 mg PO q6h  Indication: UTI - recent ED visit on 2/25 with complaints of dysuria  Renal Function:  Estimated Creatinine Clearance: 23.8 mL/min (A) (by C-G formula based on SCr of 1.56 mg/dL (H)).    Antimicrobial dosage has been changed to:  Cephalexin 500 mg PO q8h  Cindi Carbon, PharmD 05/09/23 8:27 PM

## 2023-05-10 ENCOUNTER — Encounter (HOSPITAL_COMMUNITY): Payer: Self-pay | Admitting: Psychiatric/Mental Health

## 2023-05-10 DIAGNOSIS — R443 Hallucinations, unspecified: Secondary | ICD-10-CM

## 2023-05-10 LAB — RAPID URINE DRUG SCREEN, HOSP PERFORMED
Amphetamines: NOT DETECTED
Barbiturates: NOT DETECTED
Benzodiazepines: NOT DETECTED
Cocaine: NOT DETECTED
Opiates: NOT DETECTED
Tetrahydrocannabinol: NOT DETECTED

## 2023-05-10 LAB — CREATININE, SERUM
Creatinine, Ser: 1.68 mg/dL — ABNORMAL HIGH (ref 0.44–1.00)
GFR, Estimated: 31 mL/min — ABNORMAL LOW (ref >60)

## 2023-05-10 NOTE — ED Notes (Signed)
 Pt too sleepy for tele assessment. [facility] team notified.
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
## 2023-05-10 NOTE — BH Assessment (Signed)
 At 0203, Per Cristal Deer, RN: "we attempted to wake her but she is too sleepy for an assessment right now." RN to notify IRIS team when pt is alert and able to engage in TTS assessment. [facility]IS has signed off and will reschedule the pt once notified.    Redmond Pulling, MS, [facility] Triage Specialist [phone]

## 2023-05-10 NOTE — Care Management (Addendum)
 Transition of Care [facility]) - Emergency Department Mini Assessment   Patient Details  Name: Maria Fox MRN: [id number] Date of Birth: 1946/02/15  Transition of Care [facility]) CM/SW Contact:    Lockie Pares, RN Phone Number: 05/10/2023, 5:12 PM   Clinical Narrative:  Patient has been seen multiple times in the ED the past few months. She comes in with SI, is taken care of at home by family. She is psychiatrically cleared. Spoke to team about assistance in home. She needs a Charity fundraiser for disease management and medication as well as social work. [facility]. Accepted by Becky Sax Called patient and SO [name], left message to return my call ED Mini Assessment: What brought you to the Emergency Department? : SI  Barriers to Discharge: No Barriers Identified  Barrier interventions: Set up home health for medication and disease management.  as well as social work     Interventions which prevented an admission or readmission: Home Health Consult or Services    Patient Contact and Communications        ,                 Admission diagnosis:  Suicidal Patient Active Problem List   Diagnosis Date Noted   Hallucinations 05/10/2023   Malingering 05/07/2023   Hallucination 04/14/2023   Hypomagnesemia 02/24/2023   Hx of atrial flutter 12/09/2022   Reactive airway disease 12/09/2022   GAD (generalized anxiety disorder) 12/09/2022   Peripheral neuropathy 12/09/2022   Pre-op evaluation 08/21/2022   Gross hematuria 07/27/2022   Recurrent UTI 07/21/2022   Chronic anticoagulation 07/21/2022   Chronic anemia 04/05/2022   POAG (primary open-angle glaucoma) 01/04/2022   Memory deficit 11/07/2021   Functional abdominal pain syndrome 07/29/2020   Acute diarrhea    Atypical chest pain 07/04/2020   Low-tension glaucoma, bilateral, severe stage 03/24/2020   Right sided temporal headache 08/06/2019   Paroxysmal atrial flutter ([name])     Acute deep vein thrombosis (DVT) of left tibial vein ([name]) 07/11/2019   Health maintenance examination 12/11/2018   Type 2 diabetes mellitus with mild nonproliferative diabetic retinopathy without macular edema, bilateral ([name]) 07/16/2018   CAD, multiple vessel s/p CABG x 3    Effort angina ([name]) 11/12/2017   Abnormal stress ECG    Dyspnea on exertion 11/04/2017   GERD (gastroesophageal reflux disease) 08/15/2017   Trigger finger 06/09/2017   Nocturnal leg movements 03/25/2017   Osteopenia 02/01/2017   Advanced care planning/counseling discussion 01/02/2017   Chronic kidney disease, stage 3a ([name]) 08/26/2016   Insomnia 08/26/2016   Suicidal ideation    Vitamin B12 deficiency 11/10/2015   Fatty liver disease, nonalcoholic 09/23/2014   Chronic diastolic CHF (congestive heart failure) ([name]) 08/27/2014   Positive hepatitis C antibody test 08/27/2014   Iron deficiency 08/27/2014   NSVT (nonsustained ventricular tachycardia) ([name])    Nausea without vomiting    Autonomic orthostatic hypotension 07/11/2014   S/P CABG x 3 07/08/2014   History of cerebrovascular accident (CVA) in adulthood 07/08/2014   Type 2 diabetes mellitus with neurological complications ([name]) 06/02/2012   Itching 03/16/2012   Contact dermatitis 02/27/2012   Medicare annual wellness visit, initial 10/28/2011   Hyperlipidemia associated with type 2 diabetes mellitus ([name])    Idiopathic angioedema 06/23/2011   Vitiligo 06/23/2011   Dermatitis 06/23/2011   Essential hypertension 05/08/2006   PCP:  Eustaquio Boyden, MD Pharmacy:   [facility] [phone] - [place], [place] - [address] AT [facility] [address] [address] [place] [address] Phone: [phone] Fax: [phone]

## 2023-05-10 NOTE — ED Notes (Signed)
 Pt is now awake and alert, lunch tray provided

## 2023-05-10 NOTE — BH Assessment (Signed)
 TTS consult will be completed by IRIS. IRIS Coordinator will communicate via secure chat the assessment time, provider name and disposition.

## 2023-05-11 MED ORDER — CEPHALEXIN 500 MG PO CAPS
500.0000 mg | ORAL_CAPSULE | Freq: Two times a day (BID) | ORAL | Status: DC
  Administered 2023-05-11: 500 mg via ORAL
  Filled 2023-05-11: qty 1

## 2023-05-11 NOTE — ED Notes (Signed)
 This nurse called patients brother to inform him that patient has had no change with discharge status and is to be discharged. Left message requesting call back or to come to [facility] ED to pick patient up. JRPRN

## 2023-05-11 NOTE — ED Notes (Signed)
 Dr. Rubin Payor states he will reach out to SW.

## 2023-05-11 NOTE — ED Provider Notes (Addendum)
  Physical Exam  BP (!) 103/55 (BP Location: Right Arm)   Pulse 67   Temp 97.9 F (36.6 C)   Resp 16   Ht 5\' 3"  (1.6 m)   Wt 61.4 kg   SpO2 96%   BMI 23.98 kg/m   Physical Exam  Procedures  Procedures  ED Course / MDM    Medical Decision Making Amount and/or Complexity of Data Reviewed Labs: ordered.  Risk OTC drugs. Prescription drug management.   Has been cleared from psychiatric standpoint.  Although psychiatry stated patient needs inpatient treatment.  Social work states that she cannot be placed to the ER.  Home health has been set up including social work.  States there is no possibility of placement through here so she needs to be discharged.  May end up needing to be escorted out since says she will not leave voluntarily.         Benjiman Core, MD 05/11/23 1439  TOC is seeing patient again.  Reportedly family member will come to pick her up tomorrow.    Benjiman Core, MD 05/11/23 1444

## 2023-05-11 NOTE — Progress Notes (Addendum)
 CSW spoke with the pt's significant other to inform him that the pt is physically cleared and ready for discharge. He stated that it was late when he received the message yesterday and that he is unable to pick the pt up at this time due to being at work. However, he will call the pt's brother, who will come to pick the pt up. CSW informed the pt's significant other that home health services have been arranged through [facility]. Pt's significant other provided verbal understanding. TOC sign-off  ADDEN 1:00pm CSW received a message from the MD reporting that the pt's brother was here to pick up the pt and had concerns about caring for her. CSW attempted to speak with the pt's brother; however, he had already left the hospital. Pt's RN stated that the pt refused to get out of bed and declined to put on clothing for d/c. MD mentioned that the pt's brother had stated he was working on getting placement at [facility]. Pt faces several barriers to LTC placement through the ER. CSW explained that the pt cannot be placed through the ER and will have to be d/c back home while working on LTC placement in the community. Per chart review, the pt does not have Medicaid listed. When speaking with the pt's partner earlier, he did not mention any concerns about caring for the pt. CSW attempted to call the pt's brother and partner, left a voicemail requesting return call. TOC to follow.  2:30pm CSW received a call back from the pt's brother, who stated that when he came to pick up the pt, she appeared to be drugged and very lethargic. He mentioned that she couldn't keep her eyes open and he had to help feed her for her to eat. He expressed concerns about caring for her in that state. He also stated that the pt lives with her partner, [name], and that he has spoken with [facility] about LTC placement. He reported needing help with getting her placed. Pt's brother mentioned that he doesn't believe the pt is able to privately  pay for placement and is unsure how much Social Security the pt receives. CSW explained that the pt will need Medicaid if she is unable to privately pay for LTC placement and provided information on where she can apply. DSS Resources has been attached to the AVS. CSW explained that a referral to [facility] will be made to assist with placement in the community. Pt's brother provided verbal acknowledgment and understanding. Pt brother is requesting to pick her up in the morning, MD made aware. TOC to follow.  Valentina Shaggy.Mesa Janus, MSW, LCSWA [facility]  Transitions of Care Clinical Social Worker I Direct Dial: [phone]  Fax: [phone] [email or web address]

## 2023-05-11 NOTE — Discharge Instructions (Signed)
 We are not able to set up placement through the ER.  Home health has been set up and social work very can hopefully help get placement but we cannot do it through here.

## 2023-05-11 NOTE — ED Notes (Signed)
 Patients brother, Levita Monical here to pick up patient due to discharge orders placed. Patient is drowsy, and not wanting to open her eyes and cooperate. States she is not leaving. Patients brother states he can not take patient home in this state and she needs to be placed in nursing facility. This nurse informed Dr. Rubin Payor. Dr. Rubin Payor came in to talk with patient and brother. JRPRN

## 2023-05-11 NOTE — ED Notes (Signed)
 SW came by and spoke to this nurse. States patient is cleared and ready for discharge that she has no option to go to a facility at this time because she only has Medicare and does not have Medicaid. JRPRN

## 2023-05-12 NOTE — ED Notes (Signed)
 Pt discharged home with brother. Taken to brother's vehicle via wheelchair.

## 2023-05-12 NOTE — Progress Notes (Signed)
 Complex Care Management Care Guide Note  05/12/2023 Name: Maria Fox MRN: [id number] DOB: Jun 01, 1945  Maria Fox is a 78 y.o. year old female who is a primary care patient of Eustaquio Boyden, MD and is actively engaged with the care management team. I reached out to [name] by phone today to assist with re-scheduling  with the Licensed Clinical Child psychotherapist.  Follow up plan: Unsuccessful telephone outreach attempt made. A HIPAA compliant phone message was left for the patient providing contact information and requesting a return call. No further outreach attempts will be made due to inability to maintain patient contact.   Burman Nieves, CMA, Care Guide [facility]  [facility], [facility] Guide Direct Dial: [phone]  Fax: [phone] Website: [email or web address]

## 2023-05-13 ENCOUNTER — Ambulatory Visit: Payer: Self-pay

## 2023-05-13 NOTE — Patient Outreach (Unsigned)
 Care Coordination   Follow Up Visit Note   05/14/2023 Name: Maria Fox MRN: [id number] DOB: 25-Jan-1946  Maria Fox is a 78 y.o. year old female who sees Maria Boyden, MD for primary care. I spoke with  Maria Fox by phone today.  What matters to the patients health and wellness today?  Patient states she still doesn't feel well.  She denies any feelings of suicidal ideations.  She states, " I just don't feel well."  Patient states she would like help  getting in an assisted living facility.  Informed patient that social worker has attempted to reach her x 2 to discuss this with her.  Patient states she is still willing to talk with Child psychotherapist.  Offered to provide patient contact phone number for social worker.  Patient states she doesn't have anything to write with at this time.  Patient states she feels like she may have another UTI infection due to burning and hurting. Patient confirmed she has a follow up visit with her primary care provider on tomorrow 05/14/23 and states she still has extra pyridium she can take for the immediate symptoms.  Patient states her brother will take her to her appointment.      Goals Addressed             This Visit's Progress    Management of chronic health conditions.       Interventions Today    Flowsheet Row Most Recent Value  Chronic Disease   Chronic disease during today's visit Other  [MDD, recurrent UTI's]  General Interventions   General Interventions Discussed/Reviewed General Interventions Reviewed, Communication with, Doctor Visits  [evaluation of current treatmentp plan for listed health condition and patients adherence to plan as established by provider. Assessed for UTI symptoms]  Doctor Visits Discussed/Reviewed Doctor Visits Reviewed  [facility] patient has follow up visit scheduled with primary care provider office on 05/14/23. Advised patient to notify provider of UTI symptoms. Confirmed patient has transportation to provider  appointment on 05/14/23.]  Communication with Social Work  Public house manager with social worker regarding patients current status and request for assisted living placement assistance. Requested social worker attempt call to patient again.]  Mental Health Interventions   Mental Health Discussed/Reviewed Mental Health Reviewed  [active listening and support. Assessed for suicidal ideation. Informed patient that social worker has been trying to reach her. Offered to provide Child psychotherapist phone number to contact. Advised to try and answer calls coming from provider office.]  Pharmacy Interventions   Pharmacy Dicussed/Reviewed Pharmacy Topics Reviewed  [medications reviewed and compliance discussed.]              SDOH assessments and interventions completed:  No     Care Coordination Interventions:  Yes, provided   Follow up plan: Follow up call scheduled for 06/06/23 at 2:00 pm    Encounter Outcome:  Patient Visit Completed   [name] RN, BSN, CCM [place]  [facility], Population Health Case Manager Phone: [phone]

## 2023-05-13 NOTE — Patient Instructions (Signed)
 Visit Information  Thank you for taking time to visit with me today. Please don't hesitate to contact me if I can be of assistance to you.   Following are the goals we discussed today:   Goals Addressed             This Visit's Progress    Management of chronic health conditions.       Interventions Today    Flowsheet Row Most Recent Value  Chronic Disease   Chronic disease during today's visit Other  [MDD, recurrent UTI's]  General Interventions   General Interventions Discussed/Reviewed General Interventions Reviewed, Communication with, Doctor Visits  [evaluation of current treatmentp plan for listed health condition and patients adherence to plan as established by provider. Assessed for UTI symptoms]  Doctor Visits Discussed/Reviewed Doctor Visits Reviewed  [facility] patient has follow up visit scheduled with primary care provider office on 05/14/23. Advised patient to notify provider of UTI symptoms. Confirmed patient has transportation to provider appointment on 05/14/23.]  Communication with Social Work  Public house manager with social worker regarding patients current status and request for assisted living placement assistance. Requested social worker attempt call to patient again.]  Mental Health Interventions   Mental Health Discussed/Reviewed Mental Health Reviewed  [active listening and support. Assessed for suicidal ideation. Informed patient that social worker has been trying to reach her. Offered to provide Child psychotherapist phone number to contact. Advised to try and answer calls coming from provider office.]  Pharmacy Interventions   Pharmacy Dicussed/Reviewed Pharmacy Topics Reviewed  [medications reviewed and compliance discussed.]              Our next appointment is by telephone on 06/06/23 at 2 pm  Please call the care guide team at [phone] if you need to cancel or reschedule your appointment.   If you are experiencing a Mental Health or Behavioral Health Crisis  or need someone to talk to, please call the Suicide and Crisis Lifeline: 988 call 1-800-273-TALK (toll free, 24 hour hotline)  Patient verbalizes understanding of instructions and care plan provided today and agrees to view in MyChart. Active MyChart status and patient understanding of how to access instructions and care plan via MyChart confirmed with patient.     [name] RN, BSN, CCM [employer], Population Health Case Manager Phone: [phone]

## 2023-05-14 ENCOUNTER — Encounter: Payer: Self-pay | Admitting: Family Medicine

## 2023-05-14 ENCOUNTER — Ambulatory Visit (INDEPENDENT_AMBULATORY_CARE_PROVIDER_SITE_OTHER): Payer: 59 | Admitting: Family Medicine

## 2023-05-14 VITALS — BP 114/60 | HR 92 | Temp 97.7°F | Resp 96 | Ht 63.0 in | Wt 132.2 lb

## 2023-05-14 DIAGNOSIS — R413 Other amnesia: Secondary | ICD-10-CM | POA: Diagnosis not present

## 2023-05-14 DIAGNOSIS — E785 Hyperlipidemia, unspecified: Secondary | ICD-10-CM

## 2023-05-14 DIAGNOSIS — Z7984 Long term (current) use of oral hypoglycemic drugs: Secondary | ICD-10-CM

## 2023-05-14 DIAGNOSIS — N390 Urinary tract infection, site not specified: Secondary | ICD-10-CM | POA: Diagnosis not present

## 2023-05-14 DIAGNOSIS — Z79899 Other long term (current) drug therapy: Secondary | ICD-10-CM

## 2023-05-14 DIAGNOSIS — R300 Dysuria: Secondary | ICD-10-CM | POA: Diagnosis not present

## 2023-05-14 DIAGNOSIS — Z8673 Personal history of transient ischemic attack (TIA), and cerebral infarction without residual deficits: Secondary | ICD-10-CM

## 2023-05-14 DIAGNOSIS — I2510 Atherosclerotic heart disease of native coronary artery without angina pectoris: Secondary | ICD-10-CM | POA: Diagnosis not present

## 2023-05-14 DIAGNOSIS — E1169 Type 2 diabetes mellitus with other specified complication: Secondary | ICD-10-CM

## 2023-05-14 DIAGNOSIS — I4892 Unspecified atrial flutter: Secondary | ICD-10-CM

## 2023-05-14 DIAGNOSIS — E1149 Type 2 diabetes mellitus with other diabetic neurological complication: Secondary | ICD-10-CM | POA: Diagnosis not present

## 2023-05-14 DIAGNOSIS — K219 Gastro-esophageal reflux disease without esophagitis: Secondary | ICD-10-CM | POA: Diagnosis not present

## 2023-05-14 DIAGNOSIS — G4700 Insomnia, unspecified: Secondary | ICD-10-CM

## 2023-05-14 DIAGNOSIS — F333 Major depressive disorder, recurrent, severe with psychotic symptoms: Secondary | ICD-10-CM

## 2023-05-14 DIAGNOSIS — R45851 Suicidal ideations: Secondary | ICD-10-CM

## 2023-05-14 LAB — POC URINALSYSI DIPSTICK (AUTOMATED)
Bilirubin, UA: NEGATIVE
Blood, UA: NEGATIVE
Glucose, UA: NEGATIVE
Ketones, UA: NEGATIVE
Nitrite, UA: NEGATIVE
Protein, UA: NEGATIVE
Spec Grav, UA: 1.02 (ref 1.010–1.025)
Urobilinogen, UA: 0.2 U/dL
pH, UA: 5.5 (ref 5.0–8.0)

## 2023-05-14 NOTE — Patient Instructions (Addendum)
 I think you're missing a medicine box - check at home to ensure you're taking  Clopidogrel (Plavix) 75mg  daily Duloxetine (Cymbalta) 60mg  daily Gabapentin 300mg  twice daily Isosorbide (imdur) 30mg  1/2 tablet daily Linagliptin (Tradjenta) 5mg  daily Metoprolol succinate (Toprol XL) 25mg  daily Seroquel 50mg  nightly Risperdal 1mg  in the morning and 2mg  at night Trazodone 50mg  nightly  And let me know what you find out.   Urinalysis test today  Talk with social worker [employer] [phone]) about placement process and Medicaid eligibility  Return in 1-2 weeks for follow up visit  I will send all medicines with refill to local [facility] pharmacy

## 2023-05-14 NOTE — Progress Notes (Unsigned)
 Ph: [phone] Fax: [phone]   Patient ID: Maria Fox, female    DOB: 1946-02-08, 78 y.o.   MRN: [id number]  This visit was conducted in person.  BP 114/60   Pulse 92   Temp 97.7 F (36.5 C) (Oral)   Resp (!) 96   Ht 5\' 3"  (1.6 m)   Wt 132 lb 4 oz (60 kg)   BMI 23.43 kg/m    CC: f/u visit  Subjective:   HPI: Maria Fox is a 78 y.o. female presenting on 05/14/2023 for Medical Management of Chronic Issues (Here for 2-3 wk f/u. Seen 3x at [facility] ED- 2/17, 2/25 and 05/09/23. Pt accompanied by brother, [name].)   See prior note for details 2/142025. Seen at ER x3 since last seen, latest ER stay for 3 days due to suicidal ideation.  Has established with [facility] care management.  Has not yet seen psychiatry.   She still desires placement at ALF. She is unable to take care of her self due to mental illness and physical limitations.  Still staying with long term partner [name] - he will need neck and lower back surgery soon and he cannot provide ongoing care for her.   Brings all meds in today - just 1 pre-packed medication box and med reconciliation performed.  She is only taking atorvastatin 40mg , eliquis 5mg  bid, glipizide 10mg  bid, metformin ER 500mg  daily, pantoprazole 40mg  twice daily.   Still having visual and auditory hallucinations.   Recurrent UTI symptoms for the past week - dysuria, urgency, frequency.  Last prescribed keflex 500mg  QID course #20 (2/26) at ER - she doesn't know if she took this.  She thinks she has urology appt 06/11/2023.   Last seen in office 12/2022 for recurrent UTI s/p hospitalization, had trouble getting in with [facility] office. Has still not been seen. UA in ER didn't have signs of infection. She states she is taking keflex 500mg  1 pill daily (was prescribed TID x7d course at ER visit 04/07/2023, UCx at that time grew MBM).  Multiple ER visits and hospitalizations over the past 3 months.     Relevant past medical, surgical, family and  social history reviewed and updated as indicated. Interim medical history since our last visit reviewed. Allergies and medications reviewed and updated. Outpatient Medications Prior to Visit  Medication Sig Dispense Refill   nitroGLYCERIN (NITROSTAT) 0.4 MG SL tablet Place 0.4 mg under the tongue every 5 (five) minutes as needed for chest pain.     apixaban (ELIQUIS) 5 MG TABS tablet Take 1 tablet (5 mg total) by mouth 2 (two) times daily. 60 tablet 5   atorvastatin (LIPITOR) 40 MG tablet TAKE 1 TABLET BY MOUTH EVERY EVENING 90 tablet 0   glipiZIDE (GLUCOTROL) 10 MG tablet Take 10 mg by mouth 2 (two) times daily.     metFORMIN (GLUCOPHAGE-XR) 500 MG 24 hr tablet Take 1 tablet (500 mg total) by mouth daily with breakfast. 90 tablet 1   pantoprazole (PROTONIX) 40 MG tablet Take 1 tablet (40 mg total) by mouth daily. (Patient taking differently: Take 40 mg by mouth 2 (two) times daily as needed (for reflux- 30 minutes before a meal).) 30 tablet 0   albuterol (PROVENTIL) (2.5 MG/3ML) 0.083% nebulizer solution Inhale 3 mLs into the lungs every 6 (six) hours as needed for wheezing or shortness of breath. 75 mL 12   Blood Glucose Monitoring Suppl DEVI 1 each by Does not apply route in the morning, at noon,  and at bedtime. May substitute to any manufacturer covered by patient's insurance. 1 each 0   dorzolamide-timolol (COSOPT) 2-0.5 % ophthalmic solution Place 1 drop into both eyes 2 (two) times daily.     feeding supplement, GLUCERNA SHAKE, (GLUCERNA SHAKE) LIQD Take 237 mLs by mouth 3 (three) times daily between meals. 30 mL 0   hydrocortisone cream 1 % Apply topically 3 (three) times daily. (Patient taking differently: Apply 1 Application topically daily as needed for itching.) 30 g 0   latanoprost (XALATAN) 0.005 % ophthalmic solution Place 1 drop into both eyes at bedtime. 2.5 mL 12   ondansetron (ZOFRAN) 4 MG tablet Take 4 mg by mouth every 8 (eight) hours as needed for nausea or vomiting.      phenazopyridine (PYRIDIUM) 100 MG tablet Take 100 mg by mouth See admin instructions. Take 100 mg by mouth once a day and 100 mg once daily as needed for burning     Saccharomyces boulardii (PROBIOTIC) 250 MG CAPS Take 250 mg by mouth daily.     Accu-Chek Softclix Lancets lancets 1 each by Other route as directed.     albuterol (VENTOLIN HFA) 108 (90 Base) MCG/ACT inhaler Inhale 1-2 puffs into the lungs every 6 (six) hours as needed for wheezing or shortness of breath.     cephALEXin (KEFLEX) 500 MG capsule Take 1 capsule (500 mg total) by mouth 4 (four) times daily. (Patient not taking: Reported on 05/09/2023) 20 capsule 0   clopidogrel (PLAVIX) 75 MG tablet Take 1 tablet (75 mg total) by mouth daily. 30 tablet 0   DULoxetine (CYMBALTA) 60 MG capsule Take 1 capsule (60 mg total) by mouth daily. 30 capsule 2   gabapentin (NEURONTIN) 300 MG capsule Take 300 mg by mouth 2 (two) times daily.     insulin glargine (LANTUS) 100 UNIT/ML Solostar Pen Inject 20 Units into the skin daily. (Patient not taking: Reported on 05/09/2023) 15 mL 11   Insulin Pen Needle (PEN NEEDLES) 32G X 6 MM MISC Use 1 pen needle daily to inject insulin. 100 each 0   isosorbide mononitrate (IMDUR) 30 MG 24 hr tablet Take 0.5 tablets (15 mg total) by mouth daily. 30 tablet 0   linagliptin (TRADJENTA) 5 MG TABS tablet Take 1 tablet (5 mg total) by mouth daily. (Patient not taking: Reported on 04/28/2023) 90 tablet 2   metoprolol succinate (TOPROL-XL) 25 MG 24 hr tablet Take 1 tablet (25 mg total) by mouth daily. 30 tablet 0   QUEtiapine (SEROQUEL) 50 MG tablet Take 50 mg by mouth at bedtime.     risperiDONE (RISPERDAL) 1 MG tablet Take 1 tablet (1 mg total) by mouth in the morning. 30 tablet 0   risperiDONE (RISPERDAL) 2 MG tablet Take 1 tablet (2 mg total) by mouth at bedtime. 30 tablet 0   traZODone (DESYREL) 50 MG tablet Take 1 tablet (50 mg total) by mouth at bedtime. 30 tablet 0   No facility-administered medications prior to  visit.     Per HPI unless specifically indicated in ROS section below Review of Systems  Objective:  BP 114/60   Pulse 92   Temp 97.7 F (36.5 C) (Oral)   Resp (!) 96   Ht 5\' 3"  (1.6 m)   Wt 132 lb 4 oz (60 kg)   BMI 23.43 kg/m   Wt Readings from Last 3 Encounters:  05/14/23 132 lb 4 oz (60 kg)  05/09/23 135 lb 5.8 oz (61.4 kg)  04/25/23 135 lb 6  oz (61.4 kg)      Physical Exam Vitals and nursing note reviewed.  Constitutional:      Appearance: Normal appearance. She is not ill-appearing.  HENT:     Mouth/Throat:     Mouth: Mucous membranes are moist.     Pharynx: Oropharynx is clear. No oropharyngeal exudate or posterior oropharyngeal erythema.  Eyes:     Extraocular Movements: Extraocular movements intact.     Pupils: Pupils are equal, round, and reactive to light.  Cardiovascular:     Rate and Rhythm: Normal rate and regular rhythm.     Pulses: Normal pulses.     Heart sounds: Normal heart sounds. No murmur heard. Pulmonary:     Effort: Pulmonary effort is normal. No respiratory distress.     Breath sounds: Normal breath sounds. No wheezing, rhonchi or rales.  Abdominal:     General: Bowel sounds are normal. There is no distension.     Palpations: Abdomen is soft. There is no mass.     Tenderness: There is abdominal tenderness (mild) in the suprapubic area. There is no right CVA tenderness, left CVA tenderness, guarding or rebound. Negative signs include Murphy's sign.     Hernia: No hernia is present.  Musculoskeletal:     Right lower leg: No edema.     Left lower leg: No edema.  Skin:    General: Skin is warm and dry.     Findings: No rash.     Comments: Vitiligo present  Neurological:     Mental Status: She is alert.  Psychiatric:        Mood and Affect: Mood normal.        Behavior: Behavior normal.        Thought Content: Thought content normal.       Results for orders placed or performed in visit on 05/14/23  POCT Urinalysis Dipstick (Automated)    Collection Time: 05/14/23  4:25 PM  Result Value Ref Range   Color, UA yellow    Clarity, UA cloudy    Glucose, UA Negative Negative   Bilirubin, UA negative    Ketones, UA negative    Spec Grav, UA 1.020 1.010 - 1.025   Blood, UA negative    pH, UA 5.5 5.0 - 8.0   Protein, UA Negative Negative   Urobilinogen, UA 0.2 0.2 or 1.0 E.U./dL   Nitrite, UA negative    Leukocytes, UA Small (1+) (A) Negative   *Note: Due to a large number of results and/or encounters for the requested time period, some results have not been displayed. A complete set of results can be found in Results Review.   Lab Results  Component Value Date   NA 135 05/06/2023   CL 105 05/06/2023   K 3.9 05/06/2023   CO2 19 (L) 05/06/2023   BUN 26 (H) 05/06/2023   CREATININE 1.68 (H) 05/10/2023   GFRNONAA 31 (L) 05/10/2023   CALCIUM 9.1 05/06/2023   PHOS 3.7 03/13/2023   ALBUMIN 4.0 05/06/2023   GLUCOSE 299 (H) 05/06/2023    Lab Results  Component Value Date   WBC 9.9 05/06/2023   HGB 11.9 (L) 05/06/2023   HCT 38.1 05/06/2023   MCV 91.6 05/06/2023   PLT 275 05/06/2023    Assessment & Plan:   Problem List Items Addressed This Visit     Hyperlipidemia associated with type 2 diabetes mellitus ([name])   Continue atorvastatin 40mg  daily.       Relevant Medications   apixaban (  ELIQUIS) 5 MG TABS tablet   atorvastatin (LIPITOR) 40 MG tablet   glipiZIDE (GLUCOTROL) 10 MG tablet   isosorbide mononitrate (IMDUR) 30 MG 24 hr tablet   linagliptin (TRADJENTA) 5 MG TABS tablet   metFORMIN (GLUCOPHAGE-XR) 500 MG 24 hr tablet   metoprolol succinate (TOPROL-XL) 25 MG 24 hr tablet   Type 2 diabetes mellitus with neurological complications ([name])   Chronic, uncontrolled. Has only been taking glipizide 10mg  BID and metformin ER 500mg  daily.  She declines injectable medication. Avoid SGLT2i in h/o recurrent UTIs, avoid GLP1RA in ?h/o gastroparesis.  Will restart linagliptin 5mg  - using caution in h/o HFpEF.        Relevant Medications   atorvastatin (LIPITOR) 40 MG tablet   glipiZIDE (GLUCOTROL) 10 MG tablet   linagliptin (TRADJENTA) 5 MG TABS tablet   metFORMIN (GLUCOPHAGE-XR) 500 MG 24 hr tablet   History of cerebrovascular accident (CVA) in adulthood   Continue atorvastatin. Restart plavix (seems she's not been taking this).  Also continues eliquis.       Suicidal ideation   S/p multiple ER visits for this.  Needs psychiatry follow up.       Insomnia   Was on seroquel and trazodone. Will continue trazodone, stop seroquel at this time as she's also supposed to be on risperdal 1mg  in am and 2mg  at night.      GERD (gastroesophageal reflux disease)   Continue PPI, drop to once daily dosing at this time in CKD.       Relevant Medications   pantoprazole (PROTONIX) 40 MG tablet   CAD, multiple vessel s/p CABG x 3   Continue statin. Restart metoprolol, plavix and imdur.       Relevant Medications   apixaban (ELIQUIS) 5 MG TABS tablet   atorvastatin (LIPITOR) 40 MG tablet   isosorbide mononitrate (IMDUR) 30 MG 24 hr tablet   metoprolol succinate (TOPROL-XL) 25 MG 24 hr tablet   Paroxysmal atrial flutter ([name])   Continues eliquis.       Relevant Medications   apixaban (ELIQUIS) 5 MG TABS tablet   atorvastatin (LIPITOR) 40 MG tablet   isosorbide mononitrate (IMDUR) 30 MG 24 hr tablet   metoprolol succinate (TOPROL-XL) 25 MG 24 hr tablet   Memory deficit   I think mood changes/hallucinations are more psychiatric than related to dementia but a neurologic process may be playing a part.  Needs formal geriatric assessment with mmse  She was referred last month by ER to neurology.       Recurrent UTI   Recurrent symptoms of dysuria, in possibly incompletely treated UTI last month. Will restart Keflex 500mg  BID 7d course.  Needs to see urology for recurrent UTI - new referral placed last visit. I will ask my referral team to follow up on this.       Relevant Medications   cephALEXin  (KEFLEX) 500 MG capsule   MDD (major depressive disorder), recurrent, severe, with psychosis ([name])   Psychiatry eval pending.  She's not been taking cymbalta, trazodone, seroquel or risperdal. Ongoing bothersome visual and auditory hallucinations.  Will restart/refill cymbalta, trazodone, risperdal to local pharmacy  Will not restart seroquel at this time as she should already be on risperdal.   Depression is affecting her ability to care for herself as evidenced by recurrent ER trips, non-adherence to medications, trouble coordinating her medical care (psychiatry, urology). She would benefit from placement at ALF. Care management involved [facility]). She states she was told she needed to apply for  medicaid. Appreciate ongoing care management assistance      Relevant Medications   DULoxetine (CYMBALTA) 60 MG capsule   Polypharmacy - Primary   This complicates care.  Med rec performed - she is not taking meds appropriately and only has 1 box of prepackaged meds from [facility] mail order pharmacy in [place] - is missing 9 other medicines from her med list. I recommend switching pharmacies to local [facility], will refill all meds to them which will hopefully facilitate correct adminstration.  See below re: med changes made.       Other Visit Diagnoses       Dysuria       Relevant Orders   POCT Urinalysis Dipstick (Automated) (Completed)   Urine Culture        Meds ordered this encounter  Medications   albuterol (VENTOLIN HFA) 108 (90 Base) MCG/ACT inhaler    Sig: Inhale 1-2 puffs into the lungs every 6 (six) hours as needed for wheezing or shortness of breath.    Dispense:  8 g    Refill:  3   apixaban (ELIQUIS) 5 MG TABS tablet    Sig: Take 1 tablet (5 mg total) by mouth 2 (two) times daily.    Dispense:  60 tablet    Refill:  6   atorvastatin (LIPITOR) 40 MG tablet    Sig: Take 1 tablet (40 mg total) by mouth every evening.    Dispense:  90 tablet    Refill:  4    clopidogrel (PLAVIX) 75 MG tablet    Sig: Take 1 tablet (75 mg total) by mouth daily.    Dispense:  90 tablet    Refill:  4   DULoxetine (CYMBALTA) 60 MG capsule    Sig: Take 1 capsule (60 mg total) by mouth daily.    Dispense:  90 capsule    Refill:  4   glipiZIDE (GLUCOTROL) 10 MG tablet    Sig: Take 1 tablet (10 mg total) by mouth 2 (two) times daily. Always take with food    Dispense:  180 tablet    Refill:  4   isosorbide mononitrate (IMDUR) 30 MG 24 hr tablet    Sig: Take 0.5 tablets (15 mg total) by mouth daily.    Dispense:  45 tablet    Refill:  4   linagliptin (TRADJENTA) 5 MG TABS tablet    Sig: Take 1 tablet (5 mg total) by mouth daily.    Dispense:  90 tablet    Refill:  4   metFORMIN (GLUCOPHAGE-XR) 500 MG 24 hr tablet    Sig: Take 1 tablet (500 mg total) by mouth daily with breakfast.    Dispense:  90 tablet    Refill:  4   metoprolol succinate (TOPROL-XL) 25 MG 24 hr tablet    Sig: Take 1 tablet (25 mg total) by mouth daily.    Dispense:  90 tablet    Refill:  4   risperiDONE (RISPERDAL) 1 MG tablet    Sig: Take 1 tablet (1 mg total) by mouth in the morning. With 2mg  at night    Dispense:  90 tablet    Refill:  4   risperiDONE (RISPERDAL) 2 MG tablet    Sig: Take 1 tablet (2 mg total) by mouth at bedtime. With 1mg  in am    Dispense:  90 tablet    Refill:  4   gabapentin (NEURONTIN) 300 MG capsule    Sig: Take 1 capsule (300  mg total) by mouth 2 (two) times daily.    Dispense:  180 capsule    Refill:  4   Accu-Chek Softclix Lancets lancets    Sig: 1 each by Other route as directed. Use to check sugars twice daily as needed E11.49    Dispense:  100 each    Refill:  11   pantoprazole (PROTONIX) 40 MG tablet    Sig: Take 1 tablet (40 mg total) by mouth daily.    Dispense:  90 tablet    Refill:  4   cephALEXin (KEFLEX) 500 MG capsule    Sig: Take 1 capsule (500 mg total) by mouth 2 (two) times daily.    Dispense:  14 capsule    Refill:  0    Orders  Placed This Encounter  Procedures   Urine Culture   POCT Urinalysis Dipstick (Automated)    Patient Instructions  I think you're missing a medicine box - check at home to ensure you're taking  Clopidogrel (Plavix) 75mg  daily Duloxetine (Cymbalta) 60mg  daily Gabapentin 300mg  twice daily Isosorbide (imdur) 30mg  1/2 tablet daily Linagliptin (Tradjenta) 5mg  daily Metoprolol succinate (Toprol XL) 25mg  daily Seroquel 50mg  nightly Risperdal 1mg  in the morning and 2mg  at night Trazodone 50mg  nightly  And let me know what you find out.   Urinalysis test today  Talk with social worker [employer] [phone]) about placement process and Medicaid eligibility  Return in 1-2 weeks for follow up visit  I will send all medicines with refill to local [facility] pharmacy  Follow up plan: Return in about 2 weeks (around 05/28/2023) for follow up visit.  Eustaquio Boyden, MD

## 2023-05-15 ENCOUNTER — Other Ambulatory Visit (HOSPITAL_COMMUNITY): Payer: Self-pay

## 2023-05-15 ENCOUNTER — Other Ambulatory Visit: Payer: Self-pay

## 2023-05-15 ENCOUNTER — Other Ambulatory Visit: Payer: Self-pay | Admitting: Family Medicine

## 2023-05-15 ENCOUNTER — Emergency Department (HOSPITAL_COMMUNITY)
Admission: EM | Admit: 2023-05-15 | Discharge: 2023-05-23 | Disposition: A | Discharge status: Home / Self Care | Attending: Emergency Medicine | Admitting: Emergency Medicine

## 2023-05-15 ENCOUNTER — Encounter (HOSPITAL_COMMUNITY): Payer: Self-pay | Admitting: Emergency Medicine

## 2023-05-15 ENCOUNTER — Telehealth: Payer: Self-pay | Admitting: Family Medicine

## 2023-05-15 ENCOUNTER — Telehealth: Payer: Self-pay

## 2023-05-15 DIAGNOSIS — E1165 Type 2 diabetes mellitus with hyperglycemia: Secondary | ICD-10-CM | POA: Insufficient documentation

## 2023-05-15 DIAGNOSIS — Z91148 Patient's other noncompliance with medication regimen for other reason: Secondary | ICD-10-CM | POA: Diagnosis not present

## 2023-05-15 DIAGNOSIS — E785 Hyperlipidemia, unspecified: Secondary | ICD-10-CM

## 2023-05-15 DIAGNOSIS — F333 Major depressive disorder, recurrent, severe with psychotic symptoms: Secondary | ICD-10-CM | POA: Diagnosis not present

## 2023-05-15 DIAGNOSIS — F430 Acute stress reaction: Secondary | ICD-10-CM | POA: Insufficient documentation

## 2023-05-15 DIAGNOSIS — N390 Urinary tract infection, site not specified: Secondary | ICD-10-CM | POA: Diagnosis not present

## 2023-05-15 DIAGNOSIS — F411 Generalized anxiety disorder: Secondary | ICD-10-CM | POA: Insufficient documentation

## 2023-05-15 DIAGNOSIS — N183 Chronic kidney disease, stage 3 unspecified: Secondary | ICD-10-CM | POA: Insufficient documentation

## 2023-05-15 DIAGNOSIS — R739 Hyperglycemia, unspecified: Secondary | ICD-10-CM

## 2023-05-15 DIAGNOSIS — G479 Sleep disorder, unspecified: Secondary | ICD-10-CM | POA: Insufficient documentation

## 2023-05-15 DIAGNOSIS — R4182 Altered mental status, unspecified: Secondary | ICD-10-CM | POA: Diagnosis not present

## 2023-05-15 DIAGNOSIS — I130 Hypertensive heart and chronic kidney disease with heart failure and stage 1 through stage 4 chronic kidney disease, or unspecified chronic kidney disease: Secondary | ICD-10-CM | POA: Insufficient documentation

## 2023-05-15 DIAGNOSIS — R4589 Other symptoms and signs involving emotional state: Secondary | ICD-10-CM | POA: Diagnosis not present

## 2023-05-15 DIAGNOSIS — E1122 Type 2 diabetes mellitus with diabetic chronic kidney disease: Secondary | ICD-10-CM | POA: Insufficient documentation

## 2023-05-15 DIAGNOSIS — I509 Heart failure, unspecified: Secondary | ICD-10-CM | POA: Insufficient documentation

## 2023-05-15 DIAGNOSIS — I2510 Atherosclerotic heart disease of native coronary artery without angina pectoris: Secondary | ICD-10-CM | POA: Insufficient documentation

## 2023-05-15 DIAGNOSIS — I4892 Unspecified atrial flutter: Secondary | ICD-10-CM

## 2023-05-15 DIAGNOSIS — Z79899 Other long term (current) drug therapy: Secondary | ICD-10-CM | POA: Insufficient documentation

## 2023-05-15 DIAGNOSIS — Z8616 Personal history of COVID-19: Secondary | ICD-10-CM | POA: Insufficient documentation

## 2023-05-15 DIAGNOSIS — F419 Anxiety disorder, unspecified: Secondary | ICD-10-CM

## 2023-05-15 DIAGNOSIS — Z765 Malingerer [conscious simulation]: Secondary | ICD-10-CM | POA: Diagnosis not present

## 2023-05-15 DIAGNOSIS — R443 Hallucinations, unspecified: Secondary | ICD-10-CM | POA: Diagnosis not present

## 2023-05-15 LAB — COMPREHENSIVE METABOLIC PANEL
ALT: 15 U/L (ref 0–44)
AST: 19 U/L (ref 15–41)
Albumin: 4.1 g/dL (ref 3.5–5.0)
Alkaline Phosphatase: 85 U/L (ref 38–126)
Anion gap: 11 (ref 5–15)
BUN: 17 mg/dL (ref 8–23)
CO2: 19 mmol/L — ABNORMAL LOW (ref 22–32)
Calcium: 9.4 mg/dL (ref 8.9–10.3)
Chloride: 105 mmol/L (ref 98–111)
Creatinine, Ser: 1.25 mg/dL — ABNORMAL HIGH (ref 0.44–1.00)
GFR, Estimated: 44 mL/min — ABNORMAL LOW (ref >60)
Glucose, Bld: 295 mg/dL — ABNORMAL HIGH (ref 70–99)
Potassium: 4.1 mmol/L (ref 3.5–5.1)
Sodium: 135 mmol/L (ref 135–145)
Total Bilirubin: 0.7 mg/dL (ref 0.0–1.2)
Total Protein: 7.7 g/dL (ref 6.5–8.1)

## 2023-05-15 LAB — CBC
HCT: 37.4 % (ref 36.0–46.0)
Hemoglobin: 12.3 g/dL (ref 12.0–15.0)
MCH: 28.8 pg (ref 26.0–34.0)
MCHC: 32.9 g/dL (ref 30.0–36.0)
MCV: 87.6 fL (ref 80.0–100.0)
Platelets: 290 10*3/uL (ref 150–400)
RBC: 4.27 MIL/uL (ref 3.87–5.11)
RDW: 13.6 % (ref 11.5–15.5)
WBC: 8.6 10*3/uL (ref 4.0–10.5)
nRBC: 0 % (ref 0.0–0.2)

## 2023-05-15 LAB — URINE CULTURE
MICRO NUMBER:: 16162545
SPECIMEN QUALITY:: ADEQUATE

## 2023-05-15 LAB — RAPID URINE DRUG SCREEN, HOSP PERFORMED
Amphetamines: NOT DETECTED
Barbiturates: NOT DETECTED
Benzodiazepines: NOT DETECTED
Cocaine: NOT DETECTED
Opiates: NOT DETECTED
Tetrahydrocannabinol: NOT DETECTED

## 2023-05-15 LAB — ETHANOL: Alcohol, Ethyl (B): <10 mg/dL (ref <10)

## 2023-05-15 LAB — ACETAMINOPHEN LEVEL: Acetaminophen (Tylenol), Serum: <10 ug/mL — ABNORMAL LOW (ref 10–30)

## 2023-05-15 LAB — CBG MONITORING, ED: Glucose-Capillary: 309 mg/dL — ABNORMAL HIGH (ref 70–99)

## 2023-05-15 LAB — SALICYLATE LEVEL: Salicylate Lvl: <7 mg/dL — ABNORMAL LOW (ref 7.0–30.0)

## 2023-05-15 MED ORDER — PANTOPRAZOLE SODIUM 40 MG PO TBEC
40.0000 mg | DELAYED_RELEASE_TABLET | Freq: Every day | ORAL | 4 refills | Status: DC
  Filled 2023-05-15: qty 90, 90d supply, fill #0

## 2023-05-15 MED ORDER — APIXABAN 5 MG PO TABS
5.0000 mg | ORAL_TABLET | Freq: Two times a day (BID) | ORAL | 6 refills | Status: DC
  Filled 2023-05-15: qty 60, 30d supply, fill #0

## 2023-05-15 MED ORDER — LINAGLIPTIN 5 MG PO TABS
5.0000 mg | ORAL_TABLET | Freq: Every day | ORAL | 4 refills | Status: DC
  Filled 2023-05-15: qty 90, 90d supply, fill #0

## 2023-05-15 MED ORDER — ALBUTEROL SULFATE HFA 108 (90 BASE) MCG/ACT IN AERS
1.0000 | INHALATION_SPRAY | Freq: Four times a day (QID) | RESPIRATORY_TRACT | 3 refills | Status: DC | PRN
Start: 2023-05-15 — End: 2024-01-26
  Filled 2023-05-15: qty 6.7, 25d supply, fill #0

## 2023-05-15 MED ORDER — METOPROLOL SUCCINATE ER 50 MG PO TB24
25.0000 mg | ORAL_TABLET | Freq: Every day | ORAL | Status: DC
Start: 2023-05-16 — End: 2023-05-23
  Administered 2023-05-16 – 2023-05-23 (×8): 25 mg via ORAL
  Filled 2023-05-15 (×8): qty 1

## 2023-05-15 MED ORDER — GLIPIZIDE 10 MG PO TABS
10.0000 mg | ORAL_TABLET | Freq: Two times a day (BID) | ORAL | Status: DC
  Administered 2023-05-16 – 2023-05-19 (×7): 10 mg via ORAL
  Filled 2023-05-15 (×7): qty 1

## 2023-05-15 MED ORDER — LATANOPROST 0.005 % OP SOLN
1.0000 [drp] | Freq: Every day | OPHTHALMIC | Status: DC
  Administered 2023-05-16 – 2023-05-22 (×8): 1 [drp] via OPHTHALMIC
  Filled 2023-05-15: qty 2.5

## 2023-05-15 MED ORDER — DULOXETINE HCL 60 MG PO CPEP
60.0000 mg | ORAL_CAPSULE | Freq: Every day | ORAL | 4 refills | Status: DC
  Filled 2023-05-15: qty 90, 90d supply, fill #0

## 2023-05-15 MED ORDER — METFORMIN HCL ER 500 MG PO TB24
500.0000 mg | ORAL_TABLET | Freq: Every day | ORAL | Status: DC
  Administered 2023-05-16 – 2023-05-23 (×8): 500 mg via ORAL
  Filled 2023-05-15 (×8): qty 1

## 2023-05-15 MED ORDER — DORZOLAMIDE HCL-TIMOLOL MAL 2-0.5 % OP SOLN: 1.0000 [drp] | Freq: Two times a day (BID) | OPHTHALMIC | Status: DC

## 2023-05-15 MED ORDER — DORZOLAMIDE HCL-TIMOLOL MAL 2-0.5 % OP SOLN
1.0000 [drp] | Freq: Two times a day (BID) | OPHTHALMIC | Status: DC
  Administered 2023-05-16 – 2023-05-23 (×16): 1 [drp] via OPHTHALMIC
  Filled 2023-05-15: qty 10

## 2023-05-15 MED ORDER — METFORMIN HCL ER 500 MG PO TB24
500.0000 mg | ORAL_TABLET | Freq: Every day | ORAL | 4 refills | Status: DC
  Filled 2023-05-15: qty 90, 90d supply, fill #0

## 2023-05-15 MED ORDER — GABAPENTIN 300 MG PO CAPS
300.0000 mg | ORAL_CAPSULE | Freq: Two times a day (BID) | ORAL | 4 refills | Status: DC
  Filled 2023-05-15: qty 180, 90d supply, fill #0

## 2023-05-15 MED ORDER — APIXABAN 2.5 MG PO TABS
2.5000 mg | ORAL_TABLET | Freq: Two times a day (BID) | ORAL | Status: DC
  Administered 2023-05-15 – 2023-05-23 (×16): 2.5 mg via ORAL
  Filled 2023-05-15 (×17): qty 1

## 2023-05-15 MED ORDER — ISOSORBIDE MONONITRATE ER 30 MG PO TB24
15.0000 mg | ORAL_TABLET | Freq: Every day | ORAL | 4 refills | Status: DC
  Filled 2023-05-15: qty 45, 90d supply, fill #0

## 2023-05-15 MED ORDER — RISPERIDONE 1 MG PO TABS
1.0000 mg | ORAL_TABLET | Freq: Every morning | ORAL | 4 refills | Status: DC
  Filled 2023-05-15: qty 90, 90d supply, fill #0

## 2023-05-15 MED ORDER — ATORVASTATIN CALCIUM 40 MG PO TABS
40.0000 mg | ORAL_TABLET | Freq: Every evening | ORAL | Status: DC
  Administered 2023-05-15 – 2023-05-22 (×8): 40 mg via ORAL
  Filled 2023-05-15 (×8): qty 1

## 2023-05-15 MED ORDER — ATORVASTATIN CALCIUM 40 MG PO TABS
40.0000 mg | ORAL_TABLET | Freq: Every evening | ORAL | 4 refills | Status: DC
  Filled 2023-05-15: qty 90, 90d supply, fill #0

## 2023-05-15 MED ORDER — ACCU-CHEK SOFTCLIX LANCETS MISC
11 refills | Status: DC
  Filled 2023-05-15: qty 100, 50d supply, fill #0

## 2023-05-15 MED ORDER — GLIPIZIDE 10 MG PO TABS
10.0000 mg | ORAL_TABLET | Freq: Two times a day (BID) | ORAL | 4 refills | Status: DC
  Filled 2023-05-15: qty 180, 90d supply, fill #0

## 2023-05-15 MED ORDER — LINAGLIPTIN 5 MG PO TABS
5.0000 mg | ORAL_TABLET | Freq: Every day | ORAL | Status: DC
  Administered 2023-05-16 – 2023-05-23 (×8): 5 mg via ORAL
  Filled 2023-05-15 (×8): qty 1

## 2023-05-15 MED ORDER — ALBUTEROL SULFATE HFA 108 (90 BASE) MCG/ACT IN AERS: 2.0000 | INHALATION_SPRAY | Freq: Four times a day (QID) | RESPIRATORY_TRACT | Status: DC | PRN

## 2023-05-15 MED ORDER — METOPROLOL SUCCINATE ER 25 MG PO TB24
25.0000 mg | ORAL_TABLET | Freq: Every day | ORAL | 4 refills | Status: DC
  Filled 2023-05-15: qty 90, 90d supply, fill #0

## 2023-05-15 MED ORDER — RISPERIDONE 2 MG PO TABS
2.0000 mg | ORAL_TABLET | Freq: Every day | ORAL | 4 refills | Status: DC
  Filled 2023-05-15: qty 90, 90d supply, fill #0

## 2023-05-15 MED ORDER — DULOXETINE HCL 30 MG PO CPEP
60.0000 mg | ORAL_CAPSULE | Freq: Every day | ORAL | Status: DC
Start: 2023-05-16 — End: 2023-05-23
  Administered 2023-05-16 – 2023-05-23 (×8): 60 mg via ORAL
  Filled 2023-05-15 (×8): qty 2

## 2023-05-15 MED ORDER — LATANOPROST 0.005 % OP SOLN: 1.0000 [drp] | Freq: Every day | OPHTHALMIC | Status: DC

## 2023-05-15 MED ORDER — CLOPIDOGREL BISULFATE 75 MG PO TABS
75.0000 mg | ORAL_TABLET | Freq: Every day | ORAL | 4 refills | Status: AC
  Filled 2023-05-15: qty 90, 90d supply, fill #0

## 2023-05-15 MED ORDER — CEPHALEXIN 500 MG PO CAPS
500.0000 mg | ORAL_CAPSULE | Freq: Two times a day (BID) | ORAL | 0 refills | Status: DC
Start: 2023-05-15 — End: 2023-06-13
  Filled 2023-05-15: qty 14, 7d supply, fill #0

## 2023-05-15 MED ORDER — RISPERIDONE 1 MG PO TABS
1.0000 mg | ORAL_TABLET | Freq: Two times a day (BID) | ORAL | Status: DC
  Administered 2023-05-15 – 2023-05-23 (×16): 1 mg via ORAL
  Filled 2023-05-15: qty 1
  Filled 2023-05-15: qty 2
  Filled 2023-05-15 (×14): qty 1

## 2023-05-15 MED ORDER — CLOPIDOGREL BISULFATE 75 MG PO TABS
75.0000 mg | ORAL_TABLET | Freq: Every day | ORAL | Status: DC
  Administered 2023-05-16 – 2023-05-23 (×8): 75 mg via ORAL
  Filled 2023-05-15 (×8): qty 1

## 2023-05-15 NOTE — Telephone Encounter (Signed)
 Copied from CRM [id number]. Topic: Clinical - Medication Refill >> May 15, 2023 12:28 PM Maria Fox wrote: Most Recent Primary Care Visit:  Provider: Eustaquio Boyden  Department: [facility]  Visit Type: HOSPITAL FOLLOW UP  Date: 04/25/2023  Medication: ALL medication   Has the patient contacted their pharmacy? No (Agent: If no, request that the patient contact the pharmacy for the refill. If patient does not wish to contact the pharmacy document the reason why and proceed with request.) (Agent: If yes, when and what did the pharmacy advise?)  Is this the correct pharmacy for this prescription? Yes If no, delete pharmacy and type the correct one.  This is the patient's preferred pharmacy:  [name] [address] [place] [address] Phone: [phone] Fax: [phone]   Has the prescription been filled recently? No  Is the patient out of the medication? Yes  Has the patient been seen for an appointment in the last year OR does the patient have an upcoming appointment? Yes  Can we respond through MyChart? No  Agent: Please be advised that Rx refills may take up to 3 business days. We ask that you follow-up with your pharmacy.

## 2023-05-15 NOTE — Telephone Encounter (Signed)
 Please notify meds were sent to [facility] pharmacy

## 2023-05-15 NOTE — Telephone Encounter (Signed)
Spoke with pt relaying Dr. G's message. Pt expresses her thanks.  

## 2023-05-15 NOTE — Assessment & Plan Note (Signed)
 Continue PPI, drop to once daily dosing at this time in CKD.

## 2023-05-15 NOTE — Assessment & Plan Note (Addendum)
 Continue statin. Restart metoprolol, plavix and imdur.

## 2023-05-15 NOTE — Telephone Encounter (Signed)
 Copied from CRM [id number]. Topic: General - Other >> May 15, 2023 12:26 PM Pascal Lux wrote: Reason for CRM: Patient called requesting a call back from Dr. Sharen Hones nurse Misty Stanley.

## 2023-05-15 NOTE — Assessment & Plan Note (Signed)
 This complicates care.  Med rec performed - she is not taking meds appropriately and only has 1 box of prepackaged meds from [facility] mail order pharmacy in [place] - is missing 9 other medicines from her med list. I recommend switching pharmacies to local [facility], will refill all meds to them which will hopefully facilitate correct adminstration.  See below re: med changes made.

## 2023-05-15 NOTE — ED Triage Notes (Signed)
 Patient c/o SI and hallucinations. Patient stated "I hear that little man screaming in my ears telling me to kill myself and take all of my medicine".

## 2023-05-15 NOTE — Assessment & Plan Note (Addendum)
 Continue atorvastatin. Restart plavix (seems she's not been taking this).  Also continues eliquis.

## 2023-05-15 NOTE — Telephone Encounter (Signed)
 Attempted to contact pt. No answer. Vm box full. Need to relay Dr Timoteo Expose message concerning urine results (see Labs, Result Notes- 05/14/23).  Urine/Dr G's msg: Your notify urinalysis is suspicious for infection. I have sent Keflex course to take twice daily for 7 days.Rx sent to [name] pharmacy. Ucx (urine culture) results are pending.

## 2023-05-15 NOTE — Assessment & Plan Note (Signed)
 Continue atorvastatin 40 mg daily.

## 2023-05-15 NOTE — Assessment & Plan Note (Addendum)
 Chronic, uncontrolled. Has only been taking glipizide 10mg  BID and metformin ER 500mg  daily.  She declines injectable medication. Avoid SGLT2i in h/o recurrent UTIs, avoid GLP1RA in ?h/o gastroparesis.  Will restart linagliptin 5mg  - using caution in h/o HFpEF.

## 2023-05-15 NOTE — Assessment & Plan Note (Addendum)
 Psychiatry eval pending.  She's not been taking cymbalta, trazodone, seroquel or risperdal. Ongoing bothersome visual and auditory hallucinations.  Will restart/refill cymbalta, trazodone, risperdal to local pharmacy  Will not restart seroquel at this time as she should already be on risperdal.   Depression is affecting her ability to care for herself as evidenced by recurrent ER trips, non-adherence to medications, trouble coordinating her medical care (psychiatry, urology). She would benefit from placement at ALF. Care management involved [facility]). She states she was told she needed to apply for medicaid. Appreciate ongoing care management assistance

## 2023-05-15 NOTE — ED Provider Notes (Addendum)
 [place] EMERGENCY DEPARTMENT AT [facility] Provider Note   CSN: [id number] Arrival date & time: 05/15/23  2115     History  Chief Complaint  Patient presents with   Stress   Depression    Maria Fox is a 78 y.o. female.  Pt with hx depression, c/o feeling depressed, tearful.  Indicates hx same with several ED evals for similar symptoms in past couple months. Pt indicates has not been taking her normal meds at home. Denies specific inciting or stressful event, indicates does not know why feeling depressed. Indicates decreased appetite and some trouble sleeping at night. Indicates lives w a long time friend who is supportive. Indicates wants to be living in assisted living or SNF.  Denies hx psychiatric hospitalization. Denies fever or chills. No headache. No chest pain. No sob. No fever or chills.   The history is provided by the patient and medical records.  Depression Pertinent negatives include no chest pain, no abdominal pain, no headaches and no shortness of breath.       Home Medications Prior to Admission medications   Medication Sig Start Date End Date Taking? Authorizing Provider  Accu-Chek Softclix Lancets lancets Use to check sugars twice daily as needed 05/15/23   Eustaquio Boyden, MD  albuterol (PROVENTIL) (2.5 MG/3ML) 0.083% nebulizer solution Inhale 3 mLs into the lungs every 6 (six) hours as needed for wheezing or shortness of breath. 03/26/23   Verner Chol, MD  albuterol (VENTOLIN HFA) 108 (90 Base) MCG/ACT inhaler Inhale 1-2 puffs into the lungs every 6 (six) hours as needed for wheezing or shortness of breath. 05/15/23   Eustaquio Boyden, MD  apixaban (ELIQUIS) 5 MG TABS tablet Take 1 tablet (5 mg total) by mouth 2 (two) times daily. 05/15/23   Eustaquio Boyden, MD  atorvastatin (LIPITOR) 40 MG tablet Take 1 tablet (40 mg total) by mouth every evening. 05/15/23   Eustaquio Boyden, MD  Blood Glucose Monitoring Suppl DEVI 1 each by Does not apply  route in the morning, at noon, and at bedtime. May substitute to any manufacturer covered by patient's insurance. 03/26/23   Verner Chol, MD  cephALEXin (KEFLEX) 500 MG capsule Take 1 capsule (500 mg total) by mouth 2 (two) times daily. 05/15/23   Eustaquio Boyden, MD  clopidogrel (PLAVIX) 75 MG tablet Take 1 tablet (75 mg total) by mouth daily. 05/15/23   Eustaquio Boyden, MD  dorzolamide-timolol (COSOPT) 2-0.5 % ophthalmic solution Place 1 drop into both eyes 2 (two) times daily. 04/24/23   [provider]  DULoxetine (CYMBALTA) 60 MG capsule Take 1 capsule (60 mg total) by mouth daily. 05/15/23   Eustaquio Boyden, MD  feeding supplement, GLUCERNA SHAKE, (GLUCERNA SHAKE) LIQD Take 237 mLs by mouth 3 (three) times daily between meals. 03/26/23   Verner Chol, MD  gabapentin (NEURONTIN) 300 MG capsule Take 1 capsule (300 mg total) by mouth 2 (two) times daily. 05/15/23   Eustaquio Boyden, MD  glipiZIDE (GLUCOTROL) 10 MG tablet Take 1 tablet (10 mg total) by mouth 2 (two) times daily. Always take with food 05/15/23   Eustaquio Boyden, MD  hydrocortisone cream 1 % Apply topically 3 (three) times daily. Patient taking differently: Apply 1 Application topically daily as needed for itching. 03/26/23   Verner Chol, MD  isosorbide mononitrate (IMDUR) 30 MG 24 hr tablet Take 0.5 tablets (15 mg total) by mouth daily. 05/15/23   Eustaquio Boyden, MD  latanoprost (XALATAN) 0.005 % ophthalmic solution Place 1 drop into both eyes  at bedtime. 03/26/23   Verner Chol, MD  linagliptin (TRADJENTA) 5 MG TABS tablet Take 1 tablet (5 mg total) by mouth daily. 05/15/23   Eustaquio Boyden, MD  metFORMIN (GLUCOPHAGE-XR) 500 MG 24 hr tablet Take 1 tablet (500 mg total) by mouth daily with breakfast. 05/15/23   Eustaquio Boyden, MD  metoprolol succinate (TOPROL-XL) 25 MG 24 hr tablet Take 1 tablet (25 mg total) by mouth daily. 05/15/23   Eustaquio Boyden, MD  nitroGLYCERIN (NITROSTAT) 0.4 MG SL tablet Place 0.4 mg  under the tongue every 5 (five) minutes as needed for chest pain. 04/04/23   [provider]  ondansetron (ZOFRAN) 4 MG tablet Take 4 mg by mouth every 8 (eight) hours as needed for nausea or vomiting.    [provider]  pantoprazole (PROTONIX) 40 MG tablet Take 1 tablet (40 mg total) by mouth daily. 05/15/23   Eustaquio Boyden, MD  phenazopyridine (PYRIDIUM) 100 MG tablet Take 100 mg by mouth See admin instructions. Take 100 mg by mouth once a day and 100 mg once daily as needed for burning    [provider]  risperiDONE (RISPERDAL) 1 MG tablet Take 1 tablet (1 mg total) by mouth in the morning. With 2mg  at night 05/15/23   Eustaquio Boyden, MD  risperiDONE (RISPERDAL) 2 MG tablet Take 1 tablet (2 mg total) by mouth at bedtime. With 1mg  in am 05/15/23   Eustaquio Boyden, MD  Saccharomyces boulardii (PROBIOTIC) 250 MG CAPS Take 250 mg by mouth daily.    [provider]      Allergies    Bee venom, Mushroom extract complex (obsolete), Penicillins, Codeine, Sulfa antibiotics, Iohexol, and Erythromycin base    Review of Systems   Review of Systems  Constitutional:  Negative for chills and fever.  HENT:  Negative for sore throat and trouble swallowing.   Eyes:  Negative for visual disturbance.  Respiratory:  Negative for cough and shortness of breath.   Cardiovascular:  Negative for chest pain and leg swelling.  Gastrointestinal:  Negative for abdominal pain, diarrhea and vomiting.  Genitourinary:  Negative for dysuria and flank pain.  Musculoskeletal:  Negative for back pain and neck pain.  Skin:  Negative for rash.  Neurological:  Negative for headaches.  Psychiatric/Behavioral:  Positive for depression, dysphoric mood and sleep disturbance. The patient is nervous/anxious.     Physical Exam Updated Vital Signs BP (!) 150/100   Pulse (!) 112   Temp 97.8 F (36.6 C)   Resp 20   SpO2 100%  Physical Exam Vitals and nursing note reviewed.   Constitutional:      Appearance: Normal appearance. She is well-developed.  HENT:     Head: Atraumatic.     Comments: No sinus or temporal tenderness.     Nose: Nose normal.     Mouth/Throat:     Mouth: Mucous membranes are moist.  Eyes:     General: No scleral icterus.    Conjunctiva/sclera: Conjunctivae normal.     Pupils: Pupils are equal, round, and reactive to light.  Neck:     Trachea: No tracheal deviation.     Comments: No stiffness or rigidity. Trachea midline. Thyroid not grossly enlarged or tender.  Cardiovascular:     Rate and Rhythm: Normal rate and regular rhythm.     Pulses: Normal pulses.     Heart sounds: Normal heart sounds. No murmur heard.    No friction rub. No gallop.  Pulmonary:     Effort: Pulmonary  effort is normal. No respiratory distress.     Breath sounds: Normal breath sounds.  Abdominal:     General: Bowel sounds are normal. There is no distension.     Palpations: Abdomen is soft.     Tenderness: There is no abdominal tenderness.  Genitourinary:    Comments: No cva tenderness.  Musculoskeletal:        General: No swelling or tenderness.     Cervical back: Normal range of motion and neck supple. No rigidity. No muscular tenderness.  Skin:    General: Skin is warm and dry.     Findings: No rash.  Neurological:     Mental Status: She is alert.     Comments: Alert, speech normal. Patient oriented. Motor/sens grossly intact bil.   Psychiatric:     Comments: Tearful, anxious appearing. Pt is quite talkative, not withdrawn. Content of speech is clear and rational, responds appropriately to all questions asked. Denies plan or desire to harm self, but states feels maybe things would be better if she wasn't here. Note made of nursing note referencing pt making statement of hearing continual voice - does not appear c/w current exam, pt does not appear to be responding to internal stimuli - no delusions or hallucinations noted.  Pt endorses decreased  appetite, however, on arrival to ED is saying she is very hungry and thirsty and wants something to eat or drink. Mentions SI, and then in next sentence mentions how she likes coming here, and how she really wants to be a facility where there are constantly others to talk to.      ED Results / Procedures / Treatments   Labs (all labs ordered are listed, but only abnormal results are displayed) Results for orders placed or performed during the hospital encounter of 05/15/23  Comprehensive metabolic panel   Collection Time: 05/15/23  9:35 PM  Result Value Ref Range   Sodium 135 135 - 145 mmol/L   Potassium 4.1 3.5 - 5.1 mmol/L   Chloride 105 98 - 111 mmol/L   CO2 19 (L) 22 - 32 mmol/L   Glucose, Bld 295 (H) 70 - 99 mg/dL   BUN 17 8 - 23 mg/dL   Creatinine, Ser 7.82 (H) 0.44 - 1.00 mg/dL   Calcium 9.4 8.9 - 95.6 mg/dL   Total Protein 7.7 6.5 - 8.1 g/dL   Albumin 4.1 3.5 - 5.0 g/dL   AST 19 15 - 41 U/L   ALT 15 0 - 44 U/L   Alkaline Phosphatase 85 38 - 126 U/L   Total Bilirubin 0.7 0.0 - 1.2 mg/dL   GFR, Estimated 44 (L) >60 mL/min   Anion gap 11 5 - 15  cbc   Collection Time: 05/15/23  9:35 PM  Result Value Ref Range   WBC 8.6 4.0 - 10.5 K/uL   RBC 4.27 3.87 - 5.11 MIL/uL   Hemoglobin 12.3 12.0 - 15.0 g/dL   HCT 21.3 08.6 - 57.8 %   MCV 87.6 80.0 - 100.0 fL   MCH 28.8 26.0 - 34.0 pg   MCHC 32.9 30.0 - 36.0 g/dL   RDW 46.9 62.9 - 52.8 %   Platelets 290 150 - 400 K/uL   nRBC 0.0 0.0 - 0.2 %  Ethanol   Collection Time: 05/15/23  9:36 PM  Result Value Ref Range   Alcohol, Ethyl (B) <10 <10 mg/dL  Salicylate level   Collection Time: 05/15/23  9:36 PM  Result Value Ref Range   Salicylate  Lvl <7.0 (L) 7.0 - 30.0 mg/dL  Acetaminophen level   Collection Time: 05/15/23  9:36 PM  Result Value Ref Range   Acetaminophen (Tylenol), Serum <10 (L) 10 - 30 ug/mL  Rapid urine drug screen (hospital performed)   Collection Time: 05/15/23  9:54 PM  Result Value Ref Range   Opiates NONE  DETECTED NONE DETECTED   Cocaine NONE DETECTED NONE DETECTED   Benzodiazepines NONE DETECTED NONE DETECTED   Amphetamines NONE DETECTED NONE DETECTED   Tetrahydrocannabinol NONE DETECTED NONE DETECTED   Barbiturates NONE DETECTED NONE DETECTED   *Note: Due to a large number of results and/or encounters for the requested time period, some results have not been displayed. A complete set of results can be found in Results Review.   CT Head Wo Contrast Result Date: 05/07/2023 CLINICAL DATA:  Mental status changes EXAM: CT HEAD WITHOUT CONTRAST TECHNIQUE: Contiguous axial images were obtained from the base of the skull through the vertex without intravenous contrast. RADIATION DOSE REDUCTION: This exam was performed according to the departmental dose-optimization program which includes automated exposure control, adjustment of the mA and/or kV according to patient size and/or use of iterative reconstruction technique. COMPARISON:  03/28/2023 FINDINGS: Brain: Old left occipital infarct, stable. There is atrophy and chronic small vessel disease changes. No acute intracranial abnormality. Specifically, no hemorrhage, hydrocephalus, mass lesion, acute infarction, or significant intracranial injury. Vascular: No hyperdense vessel or unexpected calcification. Skull: No acute calvarial abnormality. Sinuses/Orbits: No acute findings Other: None IMPRESSION: Old left occipital infarct, stable. Atrophy, chronic microvascular disease. No acute intracranial abnormality. Electronically Signed   By: Charlett Nose M.D.   On: 05/07/2023 00:32     EKG None  Radiology No results found.  Procedures Procedures    Medications Ordered in ED Medications - No data to display  ED Course/ Medical Decision Making/ A&P                                 Medical Decision Making Problems Addressed: Acute stress reaction causing mixed disturbance of emotion and conduct: acute illness or injury with systemic symptoms that  poses a threat to life or bodily functions Anxiety: acute illness or injury with systemic symptoms Tearfulness: acute illness or injury  Amount and/or Complexity of Data Reviewed External Data Reviewed: labs, radiology and notes. Labs: ordered. Decision-making details documented in ED Course. Radiology: independent interpretation performed. Decision-making details documented in ED Course.  Risk Prescription drug management. Decision regarding hospitalization.  Labs ordered/sent.   Differential diagnosis includes depression, SI, mood disorder, anxiety/stress reaction, etc. Dispo decision including potential need for admission considered - will get labs and reassess.   Reviewed nursing notes and prior charts for additional history. External reports reviewed.  It appears pt saw pcp yesterday - he noted some polypharmacy and indicates feels does need plan for bh follow up.  Recent u cx negative. No current dysuria or fever.   Labs reviewed/interpreted by me - wbc and hgb normal. Chem w elev glucose, ag not elevated.   Recent head CT reviewed/interpreted by me - no hem or acute process then.   BH team consulted. It appears patient may benefit from coordination of services between pcp, bh team/outpatient bh f/u plan, and TOC/?home health services vs possible ECF (although pt does not have medicaid, and not clear if self pain is option).   The patient has been placed in psychiatric observation due to the need to  provide a safe environment for the patient while obtaining psychiatric consultation and evaluation, as well as ongoing medical and medication management to treat the patient's condition.  The patient has not been placed under full IVC at this time.  Pt confirmed on 2 different eye drops at night, ordered along with other home meds.   Signed out to oncoming EDP team.         Final Clinical Impression(s) / ED Diagnoses Final diagnoses:  None    Rx / DC Orders ED Discharge  Orders     None          Cathren Laine, MD 05/15/23 2309

## 2023-05-15 NOTE — Telephone Encounter (Signed)
 Spoke with pt notifying her Dr Reece Agar sent rx to [facility] pharmacy. Pt verbalizes understanding and expresses her thanks.

## 2023-05-15 NOTE — Assessment & Plan Note (Signed)
 Was on seroquel and trazodone. Will continue trazodone, stop seroquel at this time as she's also supposed to be on risperdal 1mg  in am and 2mg  at night.

## 2023-05-15 NOTE — Assessment & Plan Note (Addendum)
 I think mood changes/hallucinations are more psychiatric than related to dementia but a neurologic process may be playing a part.  Needs formal geriatric assessment with [facility]  She was referred last month by ER to neurology.

## 2023-05-15 NOTE — Progress Notes (Signed)
 Care Guide Pharmacy Note  05/15/2023 Name: Maria Fox MRN: [id number] DOB: 03-Dec-1945  Referred By: Eustaquio Boyden, MD Reason for referral: Care Coordination (Outreach to schedule with Pharm d )   Maria Fox is a 78 y.o. year old female who is a primary care patient of Eustaquio Boyden, MD.  [name] was referred to the pharmacist for assistance related to: HLD and DMII  Successful contact was made with the patient to discuss pharmacy services including being ready for the pharmacist to call at least 5 minutes before the scheduled appointment time and to have medication bottles and any blood pressure readings ready for review. The patient agreed to meet with the pharmacist via telephone visit on (date/time).05/16/2023  Penne Lash , RMA     [place]  [facility], [facility] Guide  Direct Dial: [phone]  Website: [email or web address]

## 2023-05-15 NOTE — Assessment & Plan Note (Signed)
 S/p multiple ER visits for this.  Needs psychiatry follow up.

## 2023-05-15 NOTE — Assessment & Plan Note (Addendum)
 Recurrent symptoms of dysuria, in possibly incompletely treated UTI last month. Will restart Keflex 500mg  BID 7d course.  Needs to see urology for recurrent UTI - new referral placed last visit. I will ask my referral team to follow up on this.

## 2023-05-15 NOTE — Assessment & Plan Note (Signed)
Continues eliquis.  

## 2023-05-16 ENCOUNTER — Telehealth: Payer: Self-pay | Admitting: Family Medicine

## 2023-05-16 ENCOUNTER — Other Ambulatory Visit

## 2023-05-16 DIAGNOSIS — R443 Hallucinations, unspecified: Secondary | ICD-10-CM | POA: Diagnosis not present

## 2023-05-16 DIAGNOSIS — Z765 Malingerer [conscious simulation]: Secondary | ICD-10-CM

## 2023-05-16 DIAGNOSIS — Z91148 Patient's other noncompliance with medication regimen for other reason: Secondary | ICD-10-CM | POA: Diagnosis not present

## 2023-05-16 DIAGNOSIS — N390 Urinary tract infection, site not specified: Secondary | ICD-10-CM | POA: Diagnosis not present

## 2023-05-16 DIAGNOSIS — G479 Sleep disorder, unspecified: Secondary | ICD-10-CM | POA: Diagnosis not present

## 2023-05-16 LAB — CBG MONITORING, ED
Glucose-Capillary: 282 mg/dL — ABNORMAL HIGH (ref 70–99)
Glucose-Capillary: 345 mg/dL — ABNORMAL HIGH (ref 70–99)
Glucose-Capillary: 271 mg/dL — ABNORMAL HIGH (ref 70–99)

## 2023-05-16 MED ORDER — GERHARDT'S BUTT CREAM
TOPICAL_CREAM | Freq: Two times a day (BID) | CUTANEOUS | Status: DC
  Filled 2023-05-16: qty 60

## 2023-05-16 NOTE — BH Assessment (Addendum)
 Comprehensive Clinical Assessment (CCA) Note  05/16/2023 Maria Fox [id number]  Disposition:  Per Sindy Guadeloupe, NP, patient will remain in the ED overnight and will be reassessed by the [facility] provider for medication recommendations. Patient could benefit from a Social Work Consult due to her request for placement.  The patient demonstrates the following risk factors for suicide: Chronic risk factors for suicide include: psychiatric disorder of depression and medical illness multiple health issues . Acute risk factors for suicide include: N/A. Protective factors for this patient include:  none reported . Considering these factors, the overall suicide risk at this point appears to be moderate. Patient is appropriate for outpatient follow up.   AUDIT    Flowsheet Row Admission (Discharged) from 03/17/2023 in [facility] BEHAVIORAL MEDICINE  Alcohol Use Disorder Identification Test Final Score (AUDIT) 0      GAD-7    Flowsheet Row ED from 05/15/2023 in [facility] Emergency Department at [facility] Office Visit from 12/27/2022 in [facility] Office Visit from 08/23/2022 in [facility] at [facility] Visit from 08/20/2022 in [facility] at [facility]  Total GAD-7 Score 11 11 0 7      Mini-Mental    Flowsheet Row Clinical Support from 12/26/2016 in [facility] at [facility]  Total Score (max 30 points ) 20      PHQ2-9    Flowsheet Row Office Visit from 12/27/2022 in [facility] Clinical Support from 12/03/2022 in [facility] [place] Office Visit from 08/23/2022 in [facility] at [facility] Office Visit from 08/20/2022 in [facility] Office Visit from 04/02/2022 in [facility] at [facility]  PHQ-2 Total Score 2 0 0 4 0  PHQ-9 Total Score 11 -- 0 10  --      Flowsheet Row ED from 05/15/2023 in [facility] Emergency Department at [facility] ED from 05/09/2023 in [facility] Emergency Department at [facility] ED from 05/06/2023 in [facility] Emergency Department at [facility]  C-SSRS RISK CATEGORY High Risk High Risk High Risk          Chief Complaint:  Chief Complaint  Patient presents with   Stress   Depression   Anxiety   Visit Diagnosis: F33.2 Major Depressive Disorder Recurrent Severe   CCA Screening, Triage and Referral (STR)  Patient Reported Information How did you hear about [place]? Self  What Is the Reason for Your Visit/Call Today? Patient presents to the ED stating that she is depressed and states that she does not know why.  Patient states that shehas been more depressed over the past two weeks and states that a lot of her depression stems from her declining health.  Patient had made statements about wanting to take all of her pills and it was assumed that she was saying that she is suicidal, but patient states that, "I just want some relief."  Patient atates that she was very anxious at admission and states that she has not slept for the past two nights.  Patient states that she hears a voice in her ears, but it really does not say anything, just goes "blub, blub, blub."  Patient denies that she has ever attempted to kill herself in the past and states that she currently does not have a mental health provider, but feels like  she needs to be on medication for her anxiety and depression.  Patient denies HI. Patient denies any history of drug or alcohol use and states, "I have never even smoked."  Patient denies any history of abuse or self mutilating behaviors.  She states that her appetite fluctuates and states that she has lost "a few pounds."  Patient states that she feels safe in her home, but states that she would like to stay in the ED until Saturday "to rest up."  Patient states that she  ultimately would like to go to assisted living, but states that she  does not have resources or medicaid to pay for it.  Informed her that without medicaid or money to pay for those services that she would not be able to be placed in a facility and explained to her that she would have to go to the Department of Social Services to apply.  Patient states that she has never been married and she has no children.  She states that she has been living with her boyfriend of 32 years and states that he assists in taking care of her and she states that he cooks for her. 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  Patient states that she has one brother who is still living.  She denies any history of any legal issues.  She denies having weapons in her home.  Patient is alert to person, place, situation, but thought it was 1945.  She was able to identify the month and who the president is.  Her judgment, insight and impulse control are mildly impaired.  Her thoughts are organized.  She does not currently appear to be responding to any internal stimuli.  Her speech is coherent and normal in tone, rate and volume.  Her eye contact is good.  How Long Has This Been Causing You Problems? 1 wk - 1 month  What Do You Feel Would Help You the Most Today? Treatment for Depression or other mood problem   Have You Recently Had Any Thoughts About Hurting Yourself? No  Are You Planning to Commit Suicide/Harm Yourself At This time? No   Flowsheet Row ED from 05/15/2023 in [facility] Department at [facility] ED from 05/09/2023 in [facility] Emergency Department at [facility] ED from 05/06/2023 in [facility] Emergency Department at [facility]  C-SSRS RISK CATEGORY High Risk High Risk High Risk       Have you Recently Had Thoughts About Hurting Someone Karolee Ohs? No  Are You Planning to Harm Someone at  This Time? No  Explanation: patient states that she just needs some "relief"   Have You Used Any Alcohol or Drugs in the Past 24 Hours? No  How Long Ago Did You Use Drugs or Alcohol? No data recorded What Did You Use and How Much? No data recorded  Do You Currently Have a Therapist/Psychiatrist? No  Name of Therapist/Psychiatrist:    Have You Been Recently Discharged From Any Office Practice or Programs? No  Explanation of Discharge From Practice/Program: No data recorded    CCA Screening Triage Referral Assessment Type of Contact: Tele-Assessment  Telemedicine Service Delivery:   Is this Initial or Reassessment? Is this Initial or Reassessment?: Initial Assessment  Date Telepsych consult ordered in [facility]:  Date Telepsych consult ordered in [facility]: 05/15/23  Time Telepsych consult ordered in [facility]:  Time Telepsych consult ordered  in [facility]: 2310  Location of Assessment: [facility] ED  Provider Location: [facility] Assessment Services   Collateral Involvement: none available   Does Patient Have a Automotive engineer Guardian? No  Legal Guardian Contact Information: none identified  Copy of Legal Guardianship Form: -- (BA)  Legal Guardian Notified of Arrival: -- (NA)  Legal Guardian Notified of Pending Discharge: -- (NA)  If Minor and Not Living with Parent(s), Who has Custody? NA  Is CPS involved or ever been involved? Never  Is APS involved or ever been involved? Never   Patient Determined To Be At Risk for Harm To Self or Others Based on Review of Patient Reported Information or Presenting Complaint? No  Method: Plan without intent  Availability of Means: Has close by  Intent: Vague intent or NA  Notification Required: Identifiable person is aware (self)  Additional Information for Danger to Others Potential: Active psychosis (hears voices)  Additional Comments for Danger to Others Potential: NA  Are There Guns or Other Weapons in Your Home? No  Types of  Guns/Weapons: NA  Are These Weapons Safely Secured?                            No  Who Could Verify You Are Able To Have These Secured: NA  Do You Have any Outstanding Charges, Pending Court Dates, Parole/Probation? NA  Contacted To Inform of Risk of Harm To Self or Others: Other: Comment (none)    Does Patient Present under Involuntary Commitment? No    [place] of Residence: [place]   Patient Currently Receiving the Following Services: Not Receiving Services   Determination of Need: Urgent (48 hours)   Options For Referral: Medication Management     CCA Biopsychosocial Patient Reported Schizophrenia/Schizoaffective Diagnosis in Past: No   Strengths: none reported   Mental Health Symptoms Depression:  Sleep (too much or little); Change in energy/activity; Hopelessness; Increase/decrease in appetite   Duration of Depressive symptoms: Duration of Depressive Symptoms: Less than two weeks   Mania:  N/A   Anxiety:   Restlessness; Tension; Worrying; Sleep   Psychosis:  Hallucinations   Duration of Psychotic symptoms: Duration of Psychotic Symptoms: Less than six months   Trauma:  None   Obsessions:  None   Compulsions:  None   Inattention:  None   Hyperactivity/Impulsivity:  None   Oppositional/Defiant Behaviors:  None   Emotional Irregularity:  None   Other Mood/Personality Symptoms:  depressed mood with anxiety    Mental Status Exam Appearance and self-care  Stature:  Average   Weight:  Average weight   Clothing:  Neat/clean   Grooming:  Normal   Cosmetic use:  None   Posture/gait:  Normal   Motor activity:  Slowed   Sensorium  Attention:  Normal   Concentration:  Normal   Orientation:  Object; Person; Place; Situation; Time   Recall/memory:  Normal   Affect and Mood  Affect:  Depressed; Flat; Anxious   Mood:  Depressed; Anxious   Relating  Eye contact:  Normal   Facial expression:  Depressed   Attitude toward examiner:   Cooperative   Thought and Language  Speech flow: Clear and Coherent   Thought content:  Appropriate to Mood and Circumstances   Preoccupation:  None   Hallucinations:  Auditory   Organization:  Engineer, site of Knowledge:  Average   Intelligence:  Average   Abstraction:  Normal  Judgement:  Impaired   Reality Testing:  Realistic   Insight:  Lacking   Decision Making:  Normal   Social Functioning  Social Maturity:  Impulsive   Social Judgement:  Normal   Stress  Stressors:  Surveyor, quantity; Other (Comment) (health issues)   Coping Ability:  Exhausted; Overwhelmed   Skill Deficits:  Decision making; Self-care   Supports:  Support needed     Religion: Religion/Spirituality Are You A Religious Person?: Yes What is Your Religious Affiliation?: Baptist How Might This Affect Treatment?: NA  Leisure/Recreation: Leisure / Recreation Do You Have Hobbies?: No  Exercise/Diet: Exercise/Diet Do You Exercise?: No Have You Gained or Lost A Significant Amount of Weight in the Past Six Months?: Yes-Lost (a few pounds) Do You Follow a Special Diet?: No Do You Have Any Trouble Sleeping?: Yes Explanation of Sleeping Difficulties: no sleep in two nights   CCA Employment/Education Employment/Work Situation: Employment / Work Situation Employment Situation: Retired Passenger transport manager has Been Impacted by Current Illness: No Has Patient ever Been in Equities trader?: No  Education: Education Is Patient Currently Attending School?: No Last Grade Completed: 12 Did You Product manager?: No Did You Have An Individualized Education Program (IIEP): No Did You Have Any Difficulty At [employer]?: No Patient's Education Has Been Impacted by Current Illness: No   CCA Family/Childhood History Family and Relationship History: Family history Marital status: Single Does patient have children?: No  Childhood History:  Childhood History By whom was/is the  patient raised?: Both parents Did patient suffer any verbal/emotional/physical/sexual abuse as a child?: No Did patient suffer from severe childhood neglect?: No Has patient ever been sexually abused/assaulted/raped as an adolescent or adult?: No Was the patient ever a victim of a crime or a disaster?: No Witnessed domestic violence?: No Has patient been affected by domestic violence as an adult?: No       CCA Substance Use Alcohol/Drug Use: Alcohol / Drug Use Pain Medications: denies Prescriptions: denies Over the Counter: denies Longest period of sobriety (when/how long): denies Negative Consequences of Use:  (no history of drug or alcohol use) Withdrawal Symptoms: None (no history of drug or alcohol use)                         ASAM's:  Six Dimensions of Multidimensional Assessment  Dimension 1:  Acute Intoxication and/or Withdrawal Potential:   Dimension 1:  Description of individual's past and current experiences of substance use and withdrawal: no history of drug or alcohol use  Dimension 2:  Biomedical Conditions and Complications:   Dimension 2:  Description of patient's biomedical conditions and  complications: no history of drug or alcohol use  Dimension 3:  Emotional, Behavioral, or Cognitive Conditions and Complications:  Dimension 3:  Description of emotional, behavioral, or cognitive conditions and complications: no history of drug or alcohol use  Dimension 4:  Readiness to Change:  Dimension 4:  Description of Readiness to Change criteria: no history of drug or alcohol use  Dimension 5:  Relapse, Continued use, or Continued Problem Potential:  Dimension 5:  Relapse, continued use, or continued problem potential critiera description: no history of drug or alcohol use  Dimension 6:  Recovery/Living Environment:  Dimension 6:  Recovery/Iiving environment criteria description: no history of drug or alcohol use  ASAM Severity Score: ASAM's Severity Rating Score:  0  ASAM Recommended Level of Treatment: ASAM Recommended Level of Treatment:  (NA)   Substance use Disorder (SUD) Substance Use Disorder (  SUD)  Checklist Symptoms of Substance Use:  (no history of drug or alcohol use)  Recommendations for Services/Supports/Treatments: Recommendations for Services/Supports/Treatments Recommendations For Services/Supports/Treatments: Medication Management  Disposition Recommendation per psychiatric provider: Plan Post Discharge/Psychiatric Care Follow-up resources Patient will be seen by [facility] Provider for medication recommendations.   DSM5 Diagnoses: Patient Active Problem List   Diagnosis Date Noted   Polypharmacy 05/15/2023   Hallucinations 05/10/2023   Severe recurrent major depressive disorder with psychotic features ([name]) 03/17/2023   Hypomagnesemia 02/24/2023   Reactive airway disease 12/09/2022   GAD (generalized anxiety disorder) 12/09/2022   Peripheral neuropathy 12/09/2022   Pre-op evaluation 08/21/2022   Gross hematuria 07/27/2022   Recurrent UTI 07/21/2022   Chronic anticoagulation 07/21/2022   Chronic anemia 04/05/2022   POAG (primary open-angle glaucoma) 01/04/2022   Memory deficit 11/07/2021   Functional abdominal pain syndrome 07/29/2020   Acute diarrhea    Atypical chest pain 07/04/2020   Low-tension glaucoma, bilateral, severe stage 03/24/2020   Right sided temporal headache 08/06/2019   Paroxysmal atrial flutter ([name])    Acute deep vein thrombosis (DVT) of left tibial vein ([name]) 07/11/2019   Health maintenance examination 12/11/2018   Type 2 diabetes mellitus with mild nonproliferative diabetic retinopathy without macular edema, bilateral ([name]) 07/16/2018   CAD, multiple vessel s/p CABG x 3    Effort angina ([name]) 11/12/2017   Abnormal stress ECG    Dyspnea on exertion 11/04/2017   GERD (gastroesophageal reflux disease) 08/15/2017   Trigger finger 06/09/2017   Nocturnal leg movements 03/25/2017   Osteopenia 02/01/2017    Advanced care planning/counseling discussion 01/02/2017   Chronic kidney disease, stage 3a ([name]) 08/26/2016   Insomnia 08/26/2016   Suicidal ideation    Vitamin B12 deficiency 11/10/2015   Fatty liver disease, nonalcoholic 09/23/2014   Chronic diastolic CHF (congestive heart failure) ([name]) 08/27/2014   Positive hepatitis C antibody test 08/27/2014   Iron deficiency 08/27/2014   NSVT (nonsustained ventricular tachycardia) ([name])    Nausea without vomiting    Autonomic orthostatic hypotension 07/11/2014   S/P CABG x 3 07/08/2014   History of cerebrovascular accident (CVA) in adulthood 07/08/2014   Type 2 diabetes mellitus with neurological complications ([name]) 06/02/2012   Itching 03/16/2012   Contact dermatitis 02/27/2012   Medicare annual wellness visit, initial 10/28/2011   Hyperlipidemia associated with type 2 diabetes mellitus ([name])    Idiopathic angioedema 06/23/2011   Vitiligo 06/23/2011   Dermatitis 06/23/2011   Essential hypertension 05/08/2006     Referrals to Alternative Service(s): Referred to Alternative Service(s):   Place:   Date:   Time:    Referred to Alternative Service(s):   Place:   Date:   Time:    Referred to Alternative Service(s):   Place:   Date:   Time:    Referred to Alternative Service(s):   Place:   Date:   Time:     Burtis Imhoff J Vivan Vanderveer, LCAS

## 2023-05-16 NOTE — ED Notes (Signed)
TTS completed. 

## 2023-05-16 NOTE — Consult Note (Signed)
 WOC Nurse Consult Note: Reason for Consult: perineal and vaginal redness Wound type: irritant dermatitis  Pressure Injury POA: NA Measurement: NA Wound bed:NA Drainage (amount, consistency, odor) NA Periwound: NA Dressing procedure/placement/frequency: Added Gerhardt's butt cream for affected areas Suggested if vaginal redness more inner labia staff ask MD for script for something for yeast if that is suspected.    Re consult if needed, will not follow at this time. Thanks  Adreanne Yono [employer], RN,CWOCN, CNS, CWON-AP [phone])

## 2023-05-16 NOTE — Progress Notes (Deleted)
 05/16/2023 Name: Maria Fox MRN: [id number] DOB: [birth year]  Subjective  No chief complaint on file.   Care Team: Primary Care Provider: Eustaquio Boyden, MD  Reason for visit: ?  Maria Fox is a 78 y.o. female who presents today for a telephone visit with the pharmacist due to medication concerns as below.  Multiple ER visits and hospitalizations over the past 3 months. Medication confusion since [facility] closed, polypharmacy with multiple med changes post-hospitalization  ?   Medication Access: ?  Prescription drug coverage: Payor: Advertising copywriter MEDICARE / Plan: Suan Halter DUAL COMPLETE / Product Type: *No Product type* / .   Reports that all medications are not affordable.  {LZ MAP s/o:31502}  Current Patient Assistance: {LZ MAP program list:31540}  Patient lives in a household of {NUMBERS:20191} {LZ MAP income choice:31503} via {Source of Income:[phone]}.  Medicare LIS Eligible: {YES/NO:21197} {LZLISELIG2024:31094}  Current Facility-Administered Medications on File Prior to Visit  Medication   albuterol (VENTOLIN HFA) 108 (90 Base) MCG/ACT inhaler 2 puff   apixaban (ELIQUIS) tablet 2.5 mg   atorvastatin (LIPITOR) tablet 40 mg   clopidogrel (PLAVIX) tablet 75 mg   dorzolamide-timolol (COSOPT) 2-0.5 % ophthalmic solution 1 drop   DULoxetine (CYMBALTA) DR capsule 60 mg   Gerhardt's butt cream   glipiZIDE (GLUCOTROL) tablet 10 mg   latanoprost (XALATAN) 0.005 % ophthalmic solution 1 drop   linagliptin (TRADJENTA) tablet 5 mg   metFORMIN (GLUCOPHAGE-XR) 24 hr tablet 500 mg   metoprolol succinate (TOPROL-XL) 24 hr tablet 25 mg   risperiDONE (RISPERDAL) tablet 1 mg   Current Outpatient Medications on File Prior to Visit  Medication Sig   Accu-Chek Softclix Lancets lancets Use to check sugars twice daily as needed   albuterol (PROVENTIL) (2.5 MG/3ML) 0.083% nebulizer solution Inhale 3 mLs into the lungs every 6 (six) hours as needed for wheezing or  shortness of breath.   albuterol (VENTOLIN HFA) 108 (90 Base) MCG/ACT inhaler Inhale 1-2 puffs into the lungs every 6 (six) hours as needed for wheezing or shortness of breath.   apixaban (ELIQUIS) 5 MG TABS tablet Take 1 tablet (5 mg total) by mouth 2 (two) times daily.   atorvastatin (LIPITOR) 40 MG tablet Take 1 tablet (40 mg total) by mouth every evening.   Blood Glucose Monitoring Suppl DEVI 1 each by Does not apply route in the morning, at noon, and at bedtime. May substitute to any manufacturer covered by patient's insurance.   cephALEXin (KEFLEX) 500 MG capsule Take 1 capsule (500 mg total) by mouth 2 (two) times daily.   clopidogrel (PLAVIX) 75 MG tablet Take 1 tablet (75 mg total) by mouth daily.   dorzolamide-timolol (COSOPT) 2-0.5 % ophthalmic solution Place 1 drop into both eyes 2 (two) times daily.   DULoxetine (CYMBALTA) 60 MG capsule Take 1 capsule (60 mg total) by mouth daily.   feeding supplement, GLUCERNA SHAKE, (GLUCERNA SHAKE) LIQD Take 237 mLs by mouth 3 (three) times daily between meals.   gabapentin (NEURONTIN) 300 MG capsule Take 1 capsule (300 mg total) by mouth 2 (two) times daily.   glipiZIDE (GLUCOTROL) 10 MG tablet Take 1 tablet (10 mg total) by mouth 2 (two) times daily. Always take with food   hydrocortisone cream 1 % Apply topically 3 (three) times daily. (Patient taking differently: Apply 1 Application topically daily as needed for itching.)   isosorbide mononitrate (IMDUR) 30 MG 24 hr tablet Take 0.5 tablets (15 mg total) by mouth daily.   latanoprost (XALATAN) 0.005 % ophthalmic  solution Place 1 drop into both eyes at bedtime.   linagliptin (TRADJENTA) 5 MG TABS tablet Take 1 tablet (5 mg total) by mouth daily.   metFORMIN (GLUCOPHAGE-XR) 500 MG 24 hr tablet Take 1 tablet (500 mg total) by mouth daily with breakfast.   metoprolol succinate (TOPROL-XL) 25 MG 24 hr tablet Take 1 tablet (25 mg total) by mouth daily.   nitroGLYCERIN (NITROSTAT) 0.4 MG SL tablet Place  0.4 mg under the tongue every 5 (five) minutes as needed for chest pain.   ondansetron (ZOFRAN) 4 MG tablet Take 4 mg by mouth every 8 (eight) hours as needed for nausea or vomiting.   pantoprazole (PROTONIX) 40 MG tablet Take 1 tablet (40 mg total) by mouth daily.   phenazopyridine (PYRIDIUM) 100 MG tablet Take 100 mg by mouth See admin instructions. Take 100 mg by mouth once a day and 100 mg once daily as needed for burning   risperiDONE (RISPERDAL) 1 MG tablet Take 1 tablet (1 mg total) by mouth in the morning. With 2mg  at night   risperiDONE (RISPERDAL) 2 MG tablet Take 1 tablet (2 mg total) by mouth at bedtime. With 1mg  in am   Saccharomyces boulardii (PROBIOTIC) 250 MG CAPS Take 250 mg by mouth daily.     Assessment and Plan:   1. Medication Access {LZ MAP program list:31540} patient portion of application filled out today and uploaded to Media Tab.  Provider page placed in PCP inbox with instruction to fax to MAP program upon signature and to scan into chart.  Patient forms: {LZ MAP income:31500} Income documentation: {LZ MAP income documents:31501} Patient is not eligible for copay cards due to government insurance. Will collaborate with CPhT team to facilitate MAP assistance for *** CPhT Med Access team cc'd for future tracking/correspondences.  Upon completion of signed forms, will fax full application to program and upload in Media Tab. {LZ MAP Fax:31505}.  Future Appointments  Date Time Provider Department Center  05/16/2023 10:30 AM [facility] PHARMACIST [facility]  05/28/2023 12:30 PM Eustaquio Boyden, MD [facility]  06/06/2023  2:00 PM Otho Ket, RN [facility] None  08/25/2023  4:00 PM Levert Feinstein, MD [facility] None  12/04/2023  3:40 PM [facility] ANNUAL WELLNESS VISIT 1 [facility] PEC    Loree Fee, PharmD Clinical Pharmacist [facility] [phone]
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
## 2023-05-16 NOTE — Telephone Encounter (Signed)
Spoke with pt relaying results and Dr. G's message.  Pt verbalizes understanding.  

## 2023-05-16 NOTE — Telephone Encounter (Signed)
 Copied from CRM [id number]. Topic: General - Other >> May 16, 2023  2:07 PM Isabell A wrote: Reason for CRM: Joelene Millin from [facility], psych unit would like to speak to someone in regards to this patient - referral for a Child psychotherapist.   Callback number: [phone].

## 2023-05-16 NOTE — ED Provider Notes (Signed)
 NP attempted to assess face to face- patient is difficult to arouse, will follow-up when patient is able to participate in assessment. - Recruitment consultant at bedside

## 2023-05-16 NOTE — Progress Notes (Addendum)
 CSW noted SNF consult. Staffed case with EDP. Pt has had multiple ED visits within the past 2 months for SI/Hallucinations. CSW discussed presentation would be barrier to placement from ED. Pt's s/o and brother have expressed challenges with placement from the community and was provided Department of Social Services resources and advised to request assistance from a placement Child psychotherapist.   Per chart review, pt's PCP at [facility] (04/25/2023) has placed referral to psychiatry and PCP SW to assist pt and family with placement in an ALF. ALF is considered Long Term Care and would best be pursued from the community due to time frame it takes to secure. No additional TOC needs at this time. TOC will sign off.

## 2023-05-16 NOTE — ED Provider Notes (Addendum)
 Emergency Medicine Observation Re-evaluation Note  Maria Fox is a 78 y.o. female, seen on rounds today.  Pt initially presented to the ED for complaints of Stress, Depression, and Anxiety Currently, the patient is sleeping.  Physical Exam  BP (!) 149/80 (BP Location: Right Arm)   Pulse 95   Temp 97.6 F (36.4 C) (Oral)   Resp 18   SpO2 98%  Physical Exam General: nad  ED Course / MDM  EKG:   I have reviewed the labs performed to date as well as medications administered while in observation.  Recent changes in the last 24 hours include evaluation by [facility].  Plan  Current plan is for re eval by psychiatry.  TOC consult recommended.  Ordered this am.    Linwood Dibbles, MD 05/16/23 [phone]  Reviewed with TOC.  No indication for [facility] consult at this time. Pt has been evaluated before.  They have provided information to the family during previous visits.  Linwood Dibbles, MD 05/16/23 [phone]

## 2023-05-16 NOTE — ED Notes (Signed)
 [facility] called pts Karena Addison, LPN at [facility] at [place] to inquire about pt having been referred to their social worker for assistance with a SNF placement for pt. [facility] left a message for Lanette Hamilton to return the call.   [facility] called the main number at [facility] to inquire about who pts provider is. Per the front desk, pts provider is Dr. Sharen Hones who was unavailable. [facility]'s provided information to have a the social worker return the call.   Jacquelynn Cree, [facility]  05/16/23

## 2023-05-17 DIAGNOSIS — Z765 Malingerer [conscious simulation]: Secondary | ICD-10-CM | POA: Diagnosis not present

## 2023-05-17 LAB — URINALYSIS, ROUTINE W REFLEX MICROSCOPIC
Bilirubin Urine: NEGATIVE
Glucose, UA: NEGATIVE mg/dL
Hgb urine dipstick: NEGATIVE
Ketones, ur: NEGATIVE mg/dL
Nitrite: NEGATIVE
Protein, ur: 30 mg/dL — AB
Specific Gravity, Urine: 1.016 (ref 1.005–1.030)
WBC, UA: >50 WBC/hpf (ref 0–5)
pH: 5 (ref 5.0–8.0)

## 2023-05-17 LAB — CBG MONITORING, ED
Glucose-Capillary: 111 mg/dL — ABNORMAL HIGH (ref 70–99)
Glucose-Capillary: 252 mg/dL — ABNORMAL HIGH (ref 70–99)
Glucose-Capillary: 246 mg/dL — ABNORMAL HIGH (ref 70–99)
Glucose-Capillary: 94 mg/dL (ref 70–99)

## 2023-05-17 MED ORDER — CEPHALEXIN 500 MG PO CAPS
500.0000 mg | ORAL_CAPSULE | Freq: Four times a day (QID) | ORAL | Status: DC
  Administered 2023-05-17 – 2023-05-19 (×8): 500 mg via ORAL
  Filled 2023-05-17 (×7): qty 1
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
## 2023-05-17 NOTE — ED Notes (Signed)
 Last snack of crackers and drink given to pt per request.

## 2023-05-17 NOTE — ED Notes (Signed)
 Pt asking for something to help her sleep, MD notified.

## 2023-05-17 NOTE — ED Provider Notes (Signed)
 Emergency Medicine Observation Re-evaluation Note  Maria Fox is a 78 y.o. female, seen on rounds today.  Pt initially presented to the ED for complaints of Stress, Depression, and Anxiety Currently, the patient is resting.  Physical Exam  BP 123/60 (BP Location: Left Arm)   Pulse 80   Temp (!) 97.5 F (36.4 C) (Oral)   Resp 17   SpO2 99%  Physical Exam General: NAD   ED Course / MDM  EKG:   I have reviewed the labs performed to date as well as medications administered while in observation.  Recent changes in the last 24 hours include no acute events reported.  Plan  Current plan is for psych observation/SW.    Wynetta Fines, MD 05/17/23 [phone]

## 2023-05-18 LAB — CBG MONITORING, ED
Glucose-Capillary: 105 mg/dL — ABNORMAL HIGH (ref 70–99)
Glucose-Capillary: 153 mg/dL — ABNORMAL HIGH (ref 70–99)

## 2023-05-18 MED ORDER — ACETAMINOPHEN 325 MG PO TABS
650.0000 mg | ORAL_TABLET | Freq: Four times a day (QID) | ORAL | Status: DC | PRN
  Administered 2023-05-18 – 2023-05-22 (×9): 650 mg via ORAL
  Filled 2023-05-18 (×11): qty 2

## 2023-05-18 MED ORDER — MELATONIN 5 MG PO TABS
5.0000 mg | ORAL_TABLET | Freq: Every day | ORAL | Status: DC
  Administered 2023-05-18 – 2023-05-22 (×5): 5 mg via ORAL
  Filled 2023-05-18 (×6): qty 1

## 2023-05-18 NOTE — ED Notes (Signed)
 Pt requesting some tylenol and something for sleep.  MD notified.

## 2023-05-18 NOTE — ED Notes (Signed)
 Pt has received their lunch tray. Pt is currently eating.

## 2023-05-18 NOTE — ED Provider Notes (Signed)
 Emergency Medicine Observation Re-evaluation Note  Maria Fox is a 78 y.o. female, seen on rounds today.  Pt initially presented to the ED for complaints of Stress, Depression, and Anxiety Currently, the patient is resting.  Physical Exam  BP 127/74 (BP Location: Left Arm)   Pulse 75   Temp 97.7 F (36.5 C) (Oral)   Resp 18   SpO2 99%  Physical Exam General: NAD   ED Course / MDM  EKG:   I have reviewed the labs performed to date as well as medications administered while in observation.  Recent changes in the last 24 hours include no acute events reported.  Plan  Current plan is for psych observation / possible discharge today.    Wynetta Fines, MD 05/18/23 [phone]

## 2023-05-18 NOTE — ED Notes (Signed)
 Patient walks with walker very well and is able to wash hands and clean self up with assistance.

## 2023-05-18 NOTE — ED Notes (Signed)
Pt is currently eating breakfast.
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
## 2023-05-18 NOTE — ED Notes (Signed)
 Pt received breakfast tray and was informed it is there.

## 2023-05-18 NOTE — ED Notes (Signed)
 Pt has received their dinner tray and is currently eating

## 2023-05-19 ENCOUNTER — Telehealth: Payer: Self-pay | Admitting: Family Medicine

## 2023-05-19 LAB — CBG MONITORING, ED
Glucose-Capillary: 100 mg/dL — ABNORMAL HIGH (ref 70–99)
Glucose-Capillary: 65 mg/dL — ABNORMAL LOW (ref 70–99)
Glucose-Capillary: 145 mg/dL — ABNORMAL HIGH (ref 70–99)
Glucose-Capillary: 214 mg/dL — ABNORMAL HIGH (ref 70–99)
Glucose-Capillary: 158 mg/dL — ABNORMAL HIGH (ref 70–99)

## 2023-05-19 LAB — URINE CULTURE: Culture: >=100000 — AB

## 2023-05-19 MED ORDER — GLIPIZIDE 10 MG PO TABS: 10.0000 mg | ORAL_TABLET | Freq: Two times a day (BID) | ORAL | Status: DC

## 2023-05-19 MED ORDER — CIPROFLOXACIN HCL 500 MG PO TABS
250.0000 mg | ORAL_TABLET | Freq: Two times a day (BID) | ORAL | Status: DC
  Administered 2023-05-19 – 2023-05-23 (×9): 250 mg via ORAL
  Filled 2023-05-19 (×9): qty 1

## 2023-05-19 NOTE — Telephone Encounter (Signed)
 Message routed to the referral team is very unclear as to what is being requested. I do not see an active referral for social worker. The patient was seen in the ED 05/15/23.   I attempted to contact Joelene Millin with [facility] and the number listed in the message goes to a cell phone and is not able to be identified as [facility] or a secure line.   It looks like Joelene Millin reached out on 05/16/23 around 2:07pm while the patient was being seen in the ED but he did not receive a call back from the office. Call routed to Referral Team today, 05/19/23, around 10:18am.   I was able to reach out to Jacquelynn Cree, NT via [employer], I was able to verify that his number is [phone] (cell phone).  Per Joelene Millin, the Patient has cycled through the ED several times and they have been working with the family trying to find long term placement for the patent to help care for her.  He states that the home environment is not a suitable living condition for the patient as they are living in a hoarding environment. Significant Other is not in the condition to be able to care for the patient, per Joelene Millin, the patient SO is a big part and hoarding situation.  There is no working shower in the home which is resulting in the patient's recurrent UTI's  Long term placement discussed with the patient and family during ED Admission. The Patient is in agreement with plan for long term placement.  Joelene Millin did recommend filing an APS report to help expedite process of placement in Long Term Care Facility.  Pt is about to be Psych Cleared and discharged, its unsafe for her to return home but she cannot remain in the hospital. Joelene Millin was hoping to start the process on either an APS Report or linking patient with Social Worker URGENTLY to help avoid re-admission to the ED.  Please advise, thanks.

## 2023-05-19 NOTE — ED Notes (Signed)
 [facility] received a call from pts sister in law Lynden Ang) letting [facility] know that she attempted to apply for Medicaid for pt over the phone but she was informed that pt would need to come into the office. [facility] suggested pts SIL apply online. [facility] forwarded pts SIL an online link to apply for Medicaid and also the phone number for the DSS Adult Placement Program to receive case management assistance for finding a long term placement for pt.   Jacquelynn Cree, [facility]  05/19/23

## 2023-05-19 NOTE — Telephone Encounter (Addendum)
 [facility] care coordination team has been involved with this patient - I know SW had had trouble reaching patient, I gave patient [name]'s (LCSW) # at last appt. I will bring both into discussion.  Do we need to file APS report or is [facility] able to help expedite placement for patient? I don't think a stat referral to [facility] would be helpful as [facility] already involved?   Patient currently in ER awaiting discharge.

## 2023-05-19 NOTE — ED Provider Notes (Signed)
 Emergency Medicine Observation Re-evaluation Note  Maria Fox is a 78 y.o. female, seen on rounds today.  Pt initially presented to the ED for complaints of Stress, Depression, and Anxiety Currently, the patient is resting.  Physical Exam  BP 128/77   Pulse 86   Temp 97.6 F (36.4 C) (Oral)   Resp 16   SpO2 99%  Physical Exam General: NAD   ED Course / MDM  EKG:   I have reviewed the labs performed to date as well as medications administered while in observation.  Recent changes in the last 24 hours include no acute events reported.  Plan  Current plan is for psych observation.  Pharmacy rec change of abx coverage for suspected UTI. Will start PO cipro.   Psych rec: ## Psychiatric Medication Recommendations:  --Risperdal 1mg  PO Q AM --Risperdal 2mg  PO Q HS --Cymbalta 60mg  PO Q day --Trazodone 50mg  PO Q HS   ## Medical Decision Making Capacity: Not specifically addressed in this encounter   ## Further Work-up:  -- most recent EKG on 05/12/23 had QtC of 482 -- Pertinent labwork reviewed earlier this admission includes: CBC, CMP, UDS, UA, alcohol and acetaminophen     ## Disposition:-- Recommend overnight observation with possible discharge in the morning, [facility] called patient PCP office and left a to inquire about pt having been referred to their social worker for assistance with a SNF placement for pt.    Wynetta Fines, MD 05/19/23 1018

## 2023-05-19 NOTE — ED Notes (Signed)
 [facility] received a call back from pts brother who reported that his wife Chemeka Filice [phone]) has more information about a possible long term placement for pt. [facility] called pts sister in law Zane Samson for an update. Chrisy Hillebrand said that she had spoken with someone named Shanda Bumps who was working on some placements for pt. Ms. Haddox was unclear as to where and that it was suggested to her by Shanda Bumps that pt may qualify for hospice care while she remains at home waiting for a placement, though pt is not terminally ill. Ms. Quinonez provided the  phone number for Shanda Bumps to obtain more information. Per pts sister in law, pt is living in a hoarder situation with her significant other and without a working shower. Pts sister in law feel that pts SO is not able to properly care for pt. [facility] suggested possibly filing an APS report due to pts home environment and her partner's inability to properly care for pt.   [facility] called Calton Golds from [employer], who spoke with pts sister in law Olegario Messier) last week regarding assistance with finding a long term placement. Ms. Denzil Hughes reports that based on pts situation she may have difficulty finding a placement. Per Ms. Cashatt, she advised pts family to apply for Medicaid in order to qualify for SNF's. Shanda Bumps mentioned pt receiving hospice services at home while she waits for a placement. [facility] explained that pt is not terminally ill and would not qualify for hospice.  [facility] received a call back from Nadara Eaton, CMA from pts PCP office regarding having a social worker from their office assist pts and her family with finding a long term placement for pt. Ashtyn said that there were no notes indicating that a Child psychotherapist form their office was assigned to assisting with pts placement in a long term facility. Ashtyn said that she would notify pts PCP, Dr. Oren Section to start the process of looking for a placement.   [facility] consulted with Fuller Mandril, Chief Nursing Officer  for [facility] regarding pts home condition and pts family not being able to properly care for pt. [facility] and Ms. Orson Gear agreed that filing and APS report would be an appropriate step to ensure the safety and well being of the pt.   [facility] called pts sister in law Olegario Messier) to reconfirm with her and pts brother that filing an APS report is an agreeable path forward. Ms. Goodspeed agreed and said that she would reach out to pts SO [name]. [facility] said that she would also reach out to Mr. Sparks to inform him of the plan. [facility] provided pts SIL with information to apply for Medicaid for pt over the phone. [facility] and pts SIL agreed to stay in contact as the process of the APS report goes forward.   [facility] called pts significant other, [name] to inform him of the intention to file an APS report. [facility] left a message for pts SO to return the call.   Jacquelynn Cree, [facility]  05/19/23

## 2023-05-19 NOTE — ED Notes (Signed)
 [facility] called pts PCP's office (Dr. Oren Section) to speak with the social worker regarding a possible long term placement that was supposedly in process. Pts PCP office provided [facility] with a phone number for Doree Barthel, LCSW [phone]). [facility] called Doree Barthel and left a HIPAA compliant message to return the call.   [facility] called pts brother to inquire about any progress that has been made toward finding pt a long term placement in a SNF. [facility] left a message to return the call.   Jacquelynn Cree, [facility]  05/19/23

## 2023-05-19 NOTE — Telephone Encounter (Signed)
 Duplicate message (see 05/16/23 phn note).

## 2023-05-19 NOTE — ED Notes (Signed)
 [facility] called the DSS Adult Protective Services intake line to file a report regarding pts living conditions and inability to care for herself. Pts brother and sister in law are in agreement in the filing of this report. The report was filed and [facility] will receive an email from DSS regarding the next steps in the process.   Jacquelynn Cree, [facility]  05/19/23

## 2023-05-19 NOTE — Telephone Encounter (Signed)
 Copied from CRM [id number]. Topic: General - Other >> May 16, 2023  2:07 PM Isabell A wrote: Reason for CRM: Joelene Millin from [facility], psych unit would like to speak to someone in regards to this patient - referral for a Child psychotherapist.   Callback number: [phone] >> May 19, 2023  9:49 AM Fonda Kinder J wrote: [name] called in again to see where social worker was at with this

## 2023-05-19 NOTE — Telephone Encounter (Signed)
 I received a Secure Chat message from [place] with [facility] regarding the patients care.   He stated,  Hey Vaughan Garfinkle: Just informing you that after consulting with administration, we decided to file the APS report. I did discuss the decision with pts brother and his wife. They are in agreement. I left a message for pts significant other but have not heard from him. Thanks.   Sending to PCP as Lorain Childes and copying Nurse and LCSW as well.

## 2023-05-20 DIAGNOSIS — Z765 Malingerer [conscious simulation]: Secondary | ICD-10-CM | POA: Diagnosis not present

## 2023-05-20 DIAGNOSIS — F333 Major depressive disorder, recurrent, severe with psychotic symptoms: Secondary | ICD-10-CM | POA: Diagnosis not present

## 2023-05-20 DIAGNOSIS — R443 Hallucinations, unspecified: Secondary | ICD-10-CM | POA: Diagnosis not present

## 2023-05-20 LAB — CBG MONITORING, ED
Glucose-Capillary: 185 mg/dL — ABNORMAL HIGH (ref 70–99)
Glucose-Capillary: 144 mg/dL — ABNORMAL HIGH (ref 70–99)

## 2023-05-20 NOTE — Progress Notes (Addendum)
 CSW received call from Maria Fox, with APS. Maria Fox inquired about CSW following pt and was informed that pt is currently under psychiatry. CSW informed Maria Fox of barriers to placement from ED and noted referral to PCP SW to assist with placement to ALF. Maria Fox reports he will speak with his supervisor to see if the pt can be assigned a placement social worker to assist family from the community. Maria Fox is also aware that pt will need a long term care payor source - which DSS placement social worker can assist with to see if pt is eligible for Medicaid.   Per chart review, RNCM on 3/1 arranged Home Health for pt through [facility]. CSW spoke with Maria Fox who reported pt is not active due to pt's insurance [facility]) declining to pay for services due to ED visit being psych related.
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
## 2023-05-20 NOTE — ED Provider Notes (Signed)
 Emergency Medicine Observation Re-evaluation Note  Maria Fox is a 78 y.o. female, seen on rounds today.  Pt initially presented to the ED for complaints of Stress, Depression, and Anxiety Currently, the patient is sleeping.  Physical Exam  BP 123/61 (BP Location: Left Arm)   Pulse 69   Temp 98 F (36.7 C) (Oral)   Resp 16   SpO2 98%  Physical Exam General: Sleeping Cardiac: Extremities well-perfused Lungs: Breathing is unlabored Psych: Deferred  ED Course / MDM  EKG:   I have reviewed the labs performed to date as well as medications administered while in observation.  Recent changes in the last 24 hours include UTI antibiotic changed to Cipro.  [facility] called to file APS report regarding patient's living conditions and inability to take care of herself.  TTS to reevaluate.  If psychiatrically cleared, patient will need SNF placement.  Plan  Current plan is for continued ED observation.    Gloris Manchester, MD 05/20/23 [phone]

## 2023-05-21 ENCOUNTER — Telehealth: Payer: Self-pay | Admitting: Family Medicine

## 2023-05-21 ENCOUNTER — Telehealth: Payer: Self-pay

## 2023-05-21 DIAGNOSIS — F333 Major depressive disorder, recurrent, severe with psychotic symptoms: Secondary | ICD-10-CM

## 2023-05-21 LAB — CBG MONITORING, ED: Glucose-Capillary: 228 mg/dL — ABNORMAL HIGH (ref 70–99)

## 2023-05-21 NOTE — Telephone Encounter (Signed)
 Copied from CRM [id number]. Topic: General - Other >> May 21, 2023  3:42 PM Deaijah H wrote: Reason for CRM: Patients sister Minnie Legros called in to follow up on nursing home for patient. Not on DPR. But would like to reached at [phone] due to brother not being able to handle it.  They are attempting to get her in a Nursing home and need assistance in the process.

## 2023-05-21 NOTE — Progress Notes (Signed)
 Per Provider, patient has been PSYCH cleared.   [place] Jamar Casagrande LCSW-A   05/21/2023 2:08 PM

## 2023-05-21 NOTE — Progress Notes (Addendum)
 CSW spoke with Maple Hudson, APS caseworker, who informed that the pt reported her income is $2500 - which would make her over the income limit for Medicaid. Pt will not qualify for Medicaid which is the long term payor source. Dorene Sorrow reported he has been unable to assess the home environment but states he will go out tomorrow. This Clinical research associate will update care team via secure chat.   Dorene Sorrow also reported that the pt states she does not contribute to the rent/mortgage. He states she reports she pays the light bill and that her s/o pays everything else. CSW requested that Dorene Sorrow assist the family with fixing the shower and coordinating cleaning services.

## 2023-05-21 NOTE — Telephone Encounter (Signed)
 Lvm on Doug's # (pt's brother; on dpr) returning call.
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
## 2023-05-21 NOTE — Telephone Encounter (Signed)
 Copied from CRM [id number]. Topic: General - Other >> May 20, 2023  5:18 PM Maria Fox wrote: Reason for CRM: Wife Michiah Masse wants the nurse to call Anthoney Harada. She states that patient is back in the hospital for the same problem.    *Alia Parsley is not on DPR.

## 2023-05-21 NOTE — ED Provider Notes (Signed)
 Emergency Medicine Observation Re-evaluation Note  Maria Fox is a 78 y.o. female, seen on rounds today.  Pt initially presented to the ED for complaints of Stress, Depression, and Anxiety Currently, the patient is asleep, no new concerns by nursing staff.  Physical Exam  BP 125/64 (BP Location: Left Arm)   Pulse 63   Temp 98.2 F (36.8 C) (Oral)   Resp 16   SpO2 97%  Physical Exam General: Asleep, no acute distress Cardiac: Regular rate Lungs: No increased WOB Psych: Calm, asleep  ED Course / MDM  EKG:   I have reviewed the labs performed to date as well as medications administered while in observation.  Recent changes in the last 24 hours include patient remains medically cleared. She is currently being treated for UTI with cipro. Pending reassessment by psych for disposition recommendations.  Plan  Current plan is for psych reassessment for disposition recommendations.    Laikyn, Gewirtz, DO 05/21/23 [phone]

## 2023-05-22 ENCOUNTER — Telehealth: Payer: Self-pay | Admitting: Family Medicine

## 2023-05-22 ENCOUNTER — Other Ambulatory Visit

## 2023-05-22 LAB — CBG MONITORING, ED
Glucose-Capillary: 166 mg/dL — ABNORMAL HIGH (ref 70–99)
Glucose-Capillary: 166 mg/dL — ABNORMAL HIGH (ref 70–99)
Glucose-Capillary: 186 mg/dL — ABNORMAL HIGH (ref 70–99)

## 2023-05-22 NOTE — Telephone Encounter (Signed)
 Called number left with message. Call would not go through. Will call back later time.

## 2023-05-22 NOTE — ED Provider Notes (Signed)
 Emergency Medicine Observation Re-evaluation Note  Maria Fox is a 78 y.o. female, seen on rounds today.  Pt initially presented to the ED for complaints of Stress, Depression, and Anxiety Currently, the patient is resting comfortably.  Physical Exam  BP 120/61 (BP Location: Left Arm)   Pulse (!) 56   Temp 97.6 F (36.4 C) (Oral)   Resp 16   SpO2 99%  Physical Exam Vitals and nursing note reviewed.  Constitutional:      General: She is not in acute distress.    Appearance: She is well-developed.  HENT:     Head: Normocephalic and atraumatic.  Eyes:     Conjunctiva/sclera: Conjunctivae normal.  Pulmonary:     Effort: Pulmonary effort is normal. No respiratory distress.  Musculoskeletal:        General: No swelling.     Cervical back: Neck supple.  Skin:    General: Skin is warm and dry.     Capillary Refill: Capillary refill takes less than 2 seconds.  Neurological:     Mental Status: She is alert.  Psychiatric:        Mood and Affect: Mood normal.      ED Course / MDM  EKG:   I have reviewed the labs performed to date as well as medications administered while in observation.  Recent changes in the last 24 hours include continued TOCevaluation.  Plan  Current plan is for placement.    Glendora Score, MD 05/22/23 [phone]

## 2023-05-22 NOTE — Telephone Encounter (Addendum)
 See 05/20/23 phn note.  Rtn pt's s-i-l, Cathy's call. Notified her I could not discuss pt's health info but that I was returning her call. She verbalizes understanding and stated they [the family] is trying to get pt into a nursing home. I asked if pt's brother, [name]- on dpr, had been contacted by anyone concerning this. She states he has but she was just trying to see what the status was. I explained that those involved should be contacting them as things progress. She verbalizes understanding and understood I could not offer more info to her passed that, then expressed her thanks for the call back. Fyi to Dr Reece Agar.
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
## 2023-05-22 NOTE — Progress Notes (Deleted)
 05/22/2023 Name: Maria Fox MRN: [id number] DOB: [birth year]  Subjective  No chief complaint on file.   Care Team: Primary Care Provider: Eustaquio Boyden, MD  Reason for visit: ?  Maria Fox is a 78 y.o. female who presents today for a telephone visit with the pharmacist due to medication concerns as below.  Multiple ER visits and hospitalizations over the past 3 months. Medication confusion since [facility] closed, polypharmacy with multiple med changes post-hospitalization  ?   Medication Access: ?  Prescription drug coverage: Payor: [employer] MEDICARE / Plan: Suan Halter DUAL COMPLETE / Product Type: *No Product type* / .   Reports that all medications are not affordable.  {LZ MAP s/o:31502}  Current Patient Assistance: {LZ MAP program list:31540}  Patient lives in a household of {NUMBERS:20191} {LZ MAP income choice:31503} via {Source of Income:[phone]}.  Medicare LIS Eligible: {YES/NO:21197} {LZLISELIG2024:31094}  Current Facility-Administered Medications on File Prior to Visit  Medication   albuterol (VENTOLIN HFA) 108 (90 Base) MCG/ACT inhaler 2 puff   apixaban (ELIQUIS) tablet 2.5 mg   atorvastatin (LIPITOR) tablet 40 mg   clopidogrel (PLAVIX) tablet 75 mg   dorzolamide-timolol (COSOPT) 2-0.5 % ophthalmic solution 1 drop   DULoxetine (CYMBALTA) DR capsule 60 mg   Gerhardt's butt cream   glipiZIDE (GLUCOTROL) tablet 10 mg   latanoprost (XALATAN) 0.005 % ophthalmic solution 1 drop   linagliptin (TRADJENTA) tablet 5 mg   metFORMIN (GLUCOPHAGE-XR) 24 hr tablet 500 mg   metoprolol succinate (TOPROL-XL) 24 hr tablet 25 mg   risperiDONE (RISPERDAL) tablet 1 mg   Current Outpatient Medications on File Prior to Visit  Medication Sig   Accu-Chek Softclix Lancets lancets Use to check sugars twice daily as needed   albuterol (PROVENTIL) (2.5 MG/3ML) 0.083% nebulizer solution Inhale 3 mLs into the lungs every 6 (six) hours as needed for wheezing or  shortness of breath.   albuterol (VENTOLIN HFA) 108 (90 Base) MCG/ACT inhaler Inhale 1-2 puffs into the lungs every 6 (six) hours as needed for wheezing or shortness of breath.   apixaban (ELIQUIS) 5 MG TABS tablet Take 1 tablet (5 mg total) by mouth 2 (two) times daily.   atorvastatin (LIPITOR) 40 MG tablet Take 1 tablet (40 mg total) by mouth every evening.   Blood Glucose Monitoring Suppl DEVI 1 each by Does not apply route in the morning, at noon, and at bedtime. May substitute to any manufacturer covered by patient's insurance.   cephALEXin (KEFLEX) 500 MG capsule Take 1 capsule (500 mg total) by mouth 2 (two) times daily.   clopidogrel (PLAVIX) 75 MG tablet Take 1 tablet (75 mg total) by mouth daily.   dorzolamide-timolol (COSOPT) 2-0.5 % ophthalmic solution Place 1 drop into both eyes 2 (two) times daily.   DULoxetine (CYMBALTA) 60 MG capsule Take 1 capsule (60 mg total) by mouth daily.   feeding supplement, GLUCERNA SHAKE, (GLUCERNA SHAKE) LIQD Take 237 mLs by mouth 3 (three) times daily between meals.   gabapentin (NEURONTIN) 300 MG capsule Take 1 capsule (300 mg total) by mouth 2 (two) times daily.   glipiZIDE (GLUCOTROL) 10 MG tablet Take 1 tablet (10 mg total) by mouth 2 (two) times daily. Always take with food   hydrocortisone cream 1 % Apply topically 3 (three) times daily. (Patient taking differently: Apply 1 Application topically daily as needed for itching.)   isosorbide mononitrate (IMDUR) 30 MG 24 hr tablet Take 0.5 tablets (15 mg total) by mouth daily.   latanoprost (XALATAN) 0.005 % ophthalmic  solution Place 1 drop into both eyes at bedtime.   linagliptin (TRADJENTA) 5 MG TABS tablet Take 1 tablet (5 mg total) by mouth daily.   metFORMIN (GLUCOPHAGE-XR) 500 MG 24 hr tablet Take 1 tablet (500 mg total) by mouth daily with breakfast.   metoprolol succinate (TOPROL-XL) 25 MG 24 hr tablet Take 1 tablet (25 mg total) by mouth daily.   nitroGLYCERIN (NITROSTAT) 0.4 MG SL tablet Place  0.4 mg under the tongue every 5 (five) minutes as needed for chest pain.   ondansetron (ZOFRAN) 4 MG tablet Take 4 mg by mouth every 8 (eight) hours as needed for nausea or vomiting.   pantoprazole (PROTONIX) 40 MG tablet Take 1 tablet (40 mg total) by mouth daily.   phenazopyridine (PYRIDIUM) 100 MG tablet Take 100 mg by mouth See admin instructions. Take 100 mg by mouth once a day and 100 mg once daily as needed for burning   risperiDONE (RISPERDAL) 1 MG tablet Take 1 tablet (1 mg total) by mouth in the morning. With 2mg  at night   risperiDONE (RISPERDAL) 2 MG tablet Take 1 tablet (2 mg total) by mouth at bedtime. With 1mg  in am   Saccharomyces boulardii (PROBIOTIC) 250 MG CAPS Take 250 mg by mouth daily.     Assessment and Plan:   1. Medication Access {LZ MAP program list:31540} patient portion of application filled out today and uploaded to Media Tab.  Provider page placed in PCP inbox with instruction to fax to MAP program upon signature and to scan into chart.  Patient forms: {LZ MAP income:31500} Income documentation: {LZ MAP income documents:31501} Patient is not eligible for copay cards due to government insurance. Will collaborate with CPhT team to facilitate MAP assistance for *** CPhT Med Access team cc'd for future tracking/correspondences.  Upon completion of signed forms, will fax full application to program and upload in Media Tab. {LZ MAP Fax:31505}.  Future Appointments  Date Time Provider Department Center  05/22/2023 10:30 AM [facility] PHARMACIST [facility]  05/28/2023 12:30 PM Eustaquio Boyden, MD [facility]  06/06/2023  2:00 PM Otho Ket, RN [facility] None  08/25/2023  4:00 PM Levert Feinstein, MD [facility] None  12/04/2023  3:40 PM [facility] ANNUAL WELLNESS VISIT 1 [facility]    Loree Fee, PharmD Clinical Pharmacist [facility] [phone]

## 2023-05-22 NOTE — Telephone Encounter (Signed)
 See note from today

## 2023-05-22 NOTE — Telephone Encounter (Signed)
 See 05/21/23 phn note.

## 2023-05-22 NOTE — Telephone Encounter (Signed)
 Copied from CRM [id number]. Topic: Clinical - Medical Advice >> May 22, 2023  9:39 AM Lorin Glass B wrote: Reason for CRM: Patients representative [name] called in regarding Nance's care. States that she is currently admitted in the ER for mental health. States that [employer] called in but wasn't on DPR. States there is issue on whether or not Fatiha can be discharged and go home. [place] [phone]

## 2023-05-22 NOTE — Telephone Encounter (Signed)
 This encounter was created in error - please disregard.

## 2023-05-23 DIAGNOSIS — Z91148 Patient's other noncompliance with medication regimen for other reason: Secondary | ICD-10-CM | POA: Diagnosis not present

## 2023-05-23 DIAGNOSIS — G479 Sleep disorder, unspecified: Secondary | ICD-10-CM | POA: Diagnosis not present

## 2023-05-23 DIAGNOSIS — N390 Urinary tract infection, site not specified: Secondary | ICD-10-CM | POA: Diagnosis not present

## 2023-05-23 NOTE — ED Provider Notes (Addendum)
 Emergency Medicine Observation Re-evaluation Note  Maria Fox is a 78 y.o. female, seen on rounds today.  Pt initially presented to the ED for complaints of Stress, Depression, and Anxiety Currently, the patient is in room without complaint.  Physical Exam  BP 121/67 (BP Location: Left Arm)   Pulse 67   Temp 97.7 F (36.5 C) (Oral)   Resp 18   SpO2 96%  Physical Exam General: Alert Cardiac: Regular rate Lungs: Clear lungs Psych: Appropriate mood  ED Course / MDM  EKG:   I have reviewed the labs performed to date as well as medications administered while in observation.  Recent changes in the last 24 hours include no changes.  Plan  Current plan is for discharge home.  Our social work team has discussed this patient's home situation with APS, home has been deemed safe.  She has caregivers at home.  Will discharge home.   Anders Simmonds T, DO 05/23/23 0805    Anders Simmonds T, DO 05/23/23 [phone]

## 2023-05-23 NOTE — Telephone Encounter (Signed)
 Lvm asking pt's significant other, [name] (on dpr), to call back.

## 2023-05-23 NOTE — Discharge Instructions (Signed)
 You may continue taking all medications as prescribed.  Please follow-up with your primary care doctor within 1 week.

## 2023-05-23 NOTE — Progress Notes (Signed)
 CSW spoke with s/o, [name], who reports he is at home and will be there to receive pt. [name] is unable to pick pt up but is in agreement with hospital providing alternative transportation. Per [facility], pt was previously returned home by [employer].Care Team in agreement that pt will return home via [employer] this morning. Ride arranged. No further TOC needs at this time.

## 2023-05-23 NOTE — Progress Notes (Signed)
 CSW spoke with Maple Hudson who reported he contacted pt's s/o by phone and inquired about completing an in home assessment. [name] declined for Dorene Sorrow to come to the home stating the pt is currently in the ED. Dorene Sorrow inquired about there being running water and [name] reports the water is on as well as other utilities. [name] stated the house is currently being renovated [place]. [name] reported this is something they'd been planning to do for a while and notes that pt would be moving to [place] with him.   Maple Hudson, APS Caseworker, stated that if the house is in the process of being renovated, there may be temporary disorganization in the home that he does not feel is an area of concern at this time. CSW notified care team via secure chat: [facility] supervisor, [facility], ED Director and Psych NP.

## 2023-05-26 NOTE — Telephone Encounter (Signed)
 Lvm asking pt's significant other, [name] (on dpr), to call back.

## 2023-05-27 ENCOUNTER — Other Ambulatory Visit (HOSPITAL_COMMUNITY): Payer: Self-pay

## 2023-05-28 ENCOUNTER — Encounter (HOSPITAL_COMMUNITY): Payer: Self-pay | Admitting: Emergency Medicine

## 2023-05-28 ENCOUNTER — Other Ambulatory Visit: Payer: Self-pay

## 2023-05-28 ENCOUNTER — Emergency Department (HOSPITAL_COMMUNITY)
Admission: EM | Admit: 2023-05-28 | Discharge: 2023-06-06 | Disposition: A | Discharge status: Home / Self Care | Attending: Emergency Medicine | Admitting: Emergency Medicine

## 2023-05-28 ENCOUNTER — Telehealth: Payer: Self-pay

## 2023-05-28 ENCOUNTER — Telehealth: Payer: Self-pay | Admitting: *Deleted

## 2023-05-28 ENCOUNTER — Ambulatory Visit: Admitting: Family Medicine

## 2023-05-28 DIAGNOSIS — I5032 Chronic diastolic (congestive) heart failure: Secondary | ICD-10-CM | POA: Insufficient documentation

## 2023-05-28 DIAGNOSIS — Z951 Presence of aortocoronary bypass graft: Secondary | ICD-10-CM | POA: Diagnosis not present

## 2023-05-28 DIAGNOSIS — Z8616 Personal history of COVID-19: Secondary | ICD-10-CM | POA: Insufficient documentation

## 2023-05-28 DIAGNOSIS — Z743 Need for continuous supervision: Secondary | ICD-10-CM | POA: Diagnosis not present

## 2023-05-28 DIAGNOSIS — Z765 Malingerer [conscious simulation]: Secondary | ICD-10-CM | POA: Insufficient documentation

## 2023-05-28 DIAGNOSIS — N289 Disorder of kidney and ureter, unspecified: Secondary | ICD-10-CM | POA: Diagnosis not present

## 2023-05-28 DIAGNOSIS — Z7901 Long term (current) use of anticoagulants: Secondary | ICD-10-CM | POA: Diagnosis not present

## 2023-05-28 DIAGNOSIS — I130 Hypertensive heart and chronic kidney disease with heart failure and stage 1 through stage 4 chronic kidney disease, or unspecified chronic kidney disease: Secondary | ICD-10-CM | POA: Diagnosis not present

## 2023-05-28 DIAGNOSIS — F32A Depression, unspecified: Secondary | ICD-10-CM

## 2023-05-28 DIAGNOSIS — I2510 Atherosclerotic heart disease of native coronary artery without angina pectoris: Secondary | ICD-10-CM | POA: Diagnosis not present

## 2023-05-28 DIAGNOSIS — E1122 Type 2 diabetes mellitus with diabetic chronic kidney disease: Secondary | ICD-10-CM | POA: Insufficient documentation

## 2023-05-28 DIAGNOSIS — F332 Major depressive disorder, recurrent severe without psychotic features: Secondary | ICD-10-CM

## 2023-05-28 DIAGNOSIS — Z7902 Long term (current) use of antithrombotics/antiplatelets: Secondary | ICD-10-CM | POA: Diagnosis not present

## 2023-05-28 DIAGNOSIS — Z79899 Other long term (current) drug therapy: Secondary | ICD-10-CM | POA: Insufficient documentation

## 2023-05-28 DIAGNOSIS — J45909 Unspecified asthma, uncomplicated: Secondary | ICD-10-CM | POA: Insufficient documentation

## 2023-05-28 DIAGNOSIS — R45851 Suicidal ideations: Secondary | ICD-10-CM

## 2023-05-28 DIAGNOSIS — R21 Rash and other nonspecific skin eruption: Secondary | ICD-10-CM | POA: Diagnosis not present

## 2023-05-28 DIAGNOSIS — N1832 Chronic kidney disease, stage 3b: Secondary | ICD-10-CM | POA: Insufficient documentation

## 2023-05-28 DIAGNOSIS — F411 Generalized anxiety disorder: Secondary | ICD-10-CM | POA: Diagnosis present

## 2023-05-28 DIAGNOSIS — I10 Essential (primary) hypertension: Secondary | ICD-10-CM | POA: Diagnosis not present

## 2023-05-28 DIAGNOSIS — E1165 Type 2 diabetes mellitus with hyperglycemia: Secondary | ICD-10-CM | POA: Insufficient documentation

## 2023-05-28 DIAGNOSIS — R197 Diarrhea, unspecified: Secondary | ICD-10-CM | POA: Diagnosis not present

## 2023-05-28 DIAGNOSIS — R6889 Other general symptoms and signs: Secondary | ICD-10-CM | POA: Diagnosis not present

## 2023-05-28 DIAGNOSIS — Z7984 Long term (current) use of oral hypoglycemic drugs: Secondary | ICD-10-CM | POA: Insufficient documentation

## 2023-05-28 DIAGNOSIS — R739 Hyperglycemia, unspecified: Secondary | ICD-10-CM | POA: Diagnosis not present

## 2023-05-28 DIAGNOSIS — F329 Major depressive disorder, single episode, unspecified: Secondary | ICD-10-CM

## 2023-05-28 DIAGNOSIS — F322 Major depressive disorder, single episode, severe without psychotic features: Secondary | ICD-10-CM

## 2023-05-28 LAB — COMPREHENSIVE METABOLIC PANEL
ALT: 12 U/L (ref 0–44)
AST: 15 U/L (ref 15–41)
Albumin: 3.7 g/dL (ref 3.5–5.0)
Alkaline Phosphatase: 71 U/L (ref 38–126)
Anion gap: 9 (ref 5–15)
BUN: 21 mg/dL (ref 8–23)
CO2: 18 mmol/L — ABNORMAL LOW (ref 22–32)
Calcium: 8.6 mg/dL — ABNORMAL LOW (ref 8.9–10.3)
Chloride: 107 mmol/L (ref 98–111)
Creatinine, Ser: 1.5 mg/dL — ABNORMAL HIGH (ref 0.44–1.00)
GFR, Estimated: 35 mL/min — ABNORMAL LOW (ref >60)
Glucose, Bld: 294 mg/dL — ABNORMAL HIGH (ref 70–99)
Potassium: 3.6 mmol/L (ref 3.5–5.1)
Sodium: 134 mmol/L — ABNORMAL LOW (ref 135–145)
Total Bilirubin: 0.9 mg/dL (ref 0.0–1.2)
Total Protein: 7.1 g/dL (ref 6.5–8.1)

## 2023-05-28 LAB — RAPID URINE DRUG SCREEN, HOSP PERFORMED
Amphetamines: NOT DETECTED
Barbiturates: NOT DETECTED
Benzodiazepines: NOT DETECTED
Cocaine: NOT DETECTED
Opiates: NOT DETECTED
Tetrahydrocannabinol: NOT DETECTED

## 2023-05-28 LAB — URINALYSIS, COMPLETE (UACMP) WITH MICROSCOPIC
Bilirubin Urine: NEGATIVE
Glucose, UA: NEGATIVE mg/dL
Hgb urine dipstick: NEGATIVE
Ketones, ur: NEGATIVE mg/dL
Nitrite: NEGATIVE
Protein, ur: NEGATIVE mg/dL
Specific Gravity, Urine: 1.015 (ref 1.005–1.030)
pH: 5 (ref 5.0–8.0)

## 2023-05-28 LAB — CBC
HCT: 36.3 % (ref 36.0–46.0)
Hemoglobin: 11.6 g/dL — ABNORMAL LOW (ref 12.0–15.0)
MCH: 28.6 pg (ref 26.0–34.0)
MCHC: 32 g/dL (ref 30.0–36.0)
MCV: 89.4 fL (ref 80.0–100.0)
Platelets: 233 10*3/uL (ref 150–400)
RBC: 4.06 MIL/uL (ref 3.87–5.11)
RDW: 14.1 % (ref 11.5–15.5)
WBC: 8.8 10*3/uL (ref 4.0–10.5)
nRBC: 0 % (ref 0.0–0.2)

## 2023-05-28 LAB — ETHANOL: Alcohol, Ethyl (B): <10 mg/dL (ref <10)

## 2023-05-28 LAB — SALICYLATE LEVEL: Salicylate Lvl: <7 mg/dL — ABNORMAL LOW (ref 7.0–30.0)

## 2023-05-28 LAB — CBG MONITORING, ED: Glucose-Capillary: 183 mg/dL — ABNORMAL HIGH (ref 70–99)

## 2023-05-28 LAB — ACETAMINOPHEN LEVEL: Acetaminophen (Tylenol), Serum: <10 ug/mL — ABNORMAL LOW (ref 10–30)

## 2023-05-28 MED ORDER — ZINC OXIDE 40 % EX OINT: 1.0000 | TOPICAL_OINTMENT | Freq: Two times a day (BID) | CUTANEOUS | Status: DC | PRN

## 2023-05-28 MED ORDER — QUETIAPINE FUMARATE 50 MG PO TABS
50.0000 mg | ORAL_TABLET | Freq: Every day | ORAL | Status: DC
  Administered 2023-05-28 – 2023-06-05 (×9): 50 mg via ORAL
  Filled 2023-05-28 (×9): qty 1

## 2023-05-28 MED ORDER — ACETAMINOPHEN 325 MG PO TABS
650.0000 mg | ORAL_TABLET | Freq: Four times a day (QID) | ORAL | Status: DC | PRN
  Administered 2023-05-28 – 2023-06-06 (×10): 650 mg via ORAL
  Filled 2023-05-28 (×10): qty 2

## 2023-05-28 MED ORDER — METFORMIN HCL ER 500 MG PO TB24
500.0000 mg | ORAL_TABLET | Freq: Every day | ORAL | Status: DC
  Administered 2023-05-28 – 2023-06-06 (×10): 500 mg via ORAL
  Filled 2023-05-28 (×10): qty 1

## 2023-05-28 MED ORDER — APIXABAN 5 MG PO TABS
5.0000 mg | ORAL_TABLET | Freq: Two times a day (BID) | ORAL | Status: DC
  Administered 2023-05-28 – 2023-06-06 (×19): 5 mg via ORAL
  Filled 2023-05-28 (×21): qty 1

## 2023-05-28 MED ORDER — ALBUTEROL SULFATE HFA 108 (90 BASE) MCG/ACT IN AERS: 1.0000 | INHALATION_SPRAY | Freq: Four times a day (QID) | RESPIRATORY_TRACT | Status: DC | PRN

## 2023-05-28 MED ORDER — GABAPENTIN 300 MG PO CAPS
300.0000 mg | ORAL_CAPSULE | Freq: Two times a day (BID) | ORAL | Status: DC
  Administered 2023-05-28 – 2023-06-06 (×19): 300 mg via ORAL
  Filled 2023-05-28 (×19): qty 1

## 2023-05-28 MED ORDER — TRAZODONE HCL 50 MG PO TABS
50.0000 mg | ORAL_TABLET | Freq: Every day | ORAL | Status: DC
  Administered 2023-05-28 – 2023-06-05 (×9): 50 mg via ORAL
  Filled 2023-05-28 (×9): qty 1

## 2023-05-28 MED ORDER — CLOPIDOGREL BISULFATE 75 MG PO TABS
75.0000 mg | ORAL_TABLET | Freq: Every day | ORAL | Status: DC
Start: 2023-05-28 — End: 2023-06-06
  Administered 2023-05-28 – 2023-06-06 (×10): 75 mg via ORAL
  Filled 2023-05-28 (×10): qty 1

## 2023-05-28 MED ORDER — DORZOLAMIDE HCL-TIMOLOL MAL 2-0.5 % OP SOLN
1.0000 [drp] | Freq: Two times a day (BID) | OPHTHALMIC | Status: DC
  Administered 2023-05-29 – 2023-06-06 (×18): 1 [drp] via OPHTHALMIC
  Filled 2023-05-28: qty 10

## 2023-05-28 MED ORDER — DULOXETINE HCL 30 MG PO CPEP
60.0000 mg | ORAL_CAPSULE | Freq: Every day | ORAL | Status: DC
  Administered 2023-05-28 – 2023-06-06 (×10): 60 mg via ORAL
  Filled 2023-05-28 (×10): qty 2

## 2023-05-28 MED ORDER — DIPHENHYDRAMINE HCL 25 MG PO CAPS
25.0000 mg | ORAL_CAPSULE | Freq: Once | ORAL | Status: AC
  Administered 2023-05-28: 25 mg via ORAL
  Filled 2023-05-28: qty 1

## 2023-05-28 MED ORDER — LATANOPROST 0.005 % OP SOLN
1.0000 [drp] | Freq: Every day | OPHTHALMIC | Status: DC
  Administered 2023-05-29 – 2023-06-05 (×9): 1 [drp] via OPHTHALMIC
  Filled 2023-05-28: qty 2.5

## 2023-05-28 MED ORDER — MELATONIN 5 MG PO TABS: 5.0000 mg | ORAL_TABLET | Freq: Every evening | ORAL | Status: DC | PRN

## 2023-05-28 MED ORDER — ATORVASTATIN CALCIUM 40 MG PO TABS
40.0000 mg | ORAL_TABLET | Freq: Every evening | ORAL | Status: DC
Start: 2023-05-28 — End: 2023-06-06
  Administered 2023-05-28 – 2023-06-05 (×9): 40 mg via ORAL
  Filled 2023-05-28 (×9): qty 1

## 2023-05-28 NOTE — Telephone Encounter (Addendum)
 Pt is currently at [facility] ED. Fyi to Dr Reece Agar.

## 2023-05-28 NOTE — Patient Outreach (Unsigned)
 Care Coordination   Follow Up Visit Note   05/29/2023 Name: Maria Fox MRN: [id number] DOB: November 30, 1945  [name] is a 78 y.o. year old female who sees Eustaquio Boyden, MD for primary care. I  spoke with significant other / designated party release, [employer].   What matters to the patients health and wellness today?  Per chart review patient currently in the ED due to suicidal ideation. Conference call to patients significant other with social worker to discuss discharge plans post ED. Patient is currently residing with significant other.  Significant other reports patient has a lack of motivation and " just wants someone to take care of her."  He states patient shows no level of activity and states when she stands up at times she just falls back down.  He states patient rely's on her depends diaper instead of going to the bathroom at times.  He states patient had a heart attack in 2016 and never seemed to bounce back to her previous active, independent self.   He reports patient frequently expresses not wanting to live. Significant other [place]  He states patient is welcome to go if she is able to manage herself independently however he will not continue to take care of her.    Goals Addressed             This Visit's Progress    Management of chronic health conditions.       Interventions Today    Flowsheet Row Most Recent Value  Chronic Disease   Chronic disease during today's visit Other  [Anxiety, depression, suicidal ideations]  General Interventions   General Interventions Discussed/Reviewed General Interventions Reviewed, Communication with  [Collaboration with social worker regarding resource/mental health needs post ED visit. Conference call with social worker to patients significant other. Ongoing collaboration with social worker/ significant other. Assist with patients health care needs.]  Mental Health Interventions    Mental Health Discussed/Reviewed Mental Health Discussed  [active listening and support]              SDOH assessments and interventions completed:  No     Care Coordination Interventions:  Yes, provided   Follow up plan: Follow up call scheduled for 06/06/23    Encounter Outcome:  Patient Visit Completed   [name] RN, BSN, CCM [place]  [facility], Population Health Case Manager Phone: [phone]

## 2023-05-28 NOTE — ED Notes (Signed)
 Patient c/o redness/itch on her back/ buttocks. Nurse to assess and back is red from the buttocks to the upper back. Buttocks warm to touch with what appears to be hives present and back is red with a more dry scaly textured flaky area. Provider called to bedside at this time. Barrier cream placed on patient and provider to order benadryl.

## 2023-05-28 NOTE — ED Provider Notes (Signed)
 [facility] AT [facility] Provider Note  CSN: [id number] Arrival date & time: 05/28/23 0149  Chief Complaint(s) Suicidal and Diarrhea  HPI Maria Fox is a 78 y.o. female With a past medical history listed below including depression and suicidal ideations here for the same.  Patient reports that she does not want to live. She has not active plans. States that she does not want to be a burden to her good friend [name], since he has been so good to her. Patient found to be soiled when EMS arrived. She reports that she soiled herself while walking towards the ambulance. Denies prior diarrhea. No recent infections, abx use. No N/V, fevers or chills. No abd pain. She does endorse gluteal and lower back itching for several days.    The history is provided by the patient.    Past Medical History Past Medical History:  Diagnosis Date   Acute cholecystitis 07/07/2019   Acute deep vein thrombosis (DVT) of left tibial vein ([name]) 07/11/2019   Acute left PCA stroke ([name]) 07/13/2015   Angioedema    Atrial flutter ([name])    Autoimmune deficiency syndrome ([name])    CAD (coronary artery disease), native coronary artery 06/2014   Chronic diastolic CHF (congestive heart failure) ([name]) 08/27/2014   Chronic kidney disease, stage 3b ([name])    Closed fracture of maxilla ([name])    COVID-19 virus infection 03/2020   CVA (cerebral infarction) 07/2014   bilateral corona radiata - periCABG   Dermatitis    eval [name] 2011: eczema, eval [name] 2011: bx negative for lichen simplex or derm herpetiformis   DM (diabetes mellitus), type 2, uncontrolled w/neurologic complication 06/02/2012   ?autonomic neuropathy, gastroparesis (06/2014)    Epidermal cyst of neck 03/25/2017   Excised by derm Diona Browner)   HCAP (healthcare-associated pneumonia) 06/2014   History of chicken pox    HLD (hyperlipidemia)    Hx of migraines    remote   Hypertension    Lobar pneumonia ([name]) 03/02/2022    Maxillary fracture ([name])    Mitral regurgitation    Multiple allergies    mold, wool, dust, feathers   NSVT (nonsustained ventricular tachycardia) ([name])    Orthostatic hypotension 07/2015   Pleural effusion, left    RBBB    S/P lens implant    left side ([name])   Tricuspid regurgitation    UTI (urinary tract infection) 06/2014   Vitiligo    Patient Active Problem List   Diagnosis Date Noted   Polypharmacy 05/15/2023   Hallucinations 05/10/2023   Severe recurrent major depressive disorder with psychotic features ([name]) 03/17/2023   Hypomagnesemia 02/24/2023   Reactive airway disease 12/09/2022   GAD (generalized anxiety disorder) 12/09/2022   Peripheral neuropathy 12/09/2022   Pre-op evaluation 08/21/2022   Gross hematuria 07/27/2022   Recurrent UTI 07/21/2022   Chronic anticoagulation 07/21/2022   Chronic anemia 04/05/2022   POAG (primary open-angle glaucoma) 01/04/2022   Memory deficit 11/07/2021   Functional abdominal pain syndrome 07/29/2020   Acute diarrhea    Atypical chest pain 07/04/2020   Low-tension glaucoma, bilateral, severe stage 03/24/2020   Right sided temporal headache 08/06/2019   Paroxysmal atrial flutter ([name])    Acute deep vein thrombosis (DVT) of left tibial vein ([name]) 07/11/2019   Health maintenance examination 12/11/2018   Type 2 diabetes mellitus with mild nonproliferative diabetic retinopathy without macular edema, bilateral ([name]) 07/16/2018   CAD, multiple vessel s/p CABG x 3    Effort angina ([name]) 11/12/2017  Abnormal stress ECG    Dyspnea on exertion 11/04/2017   GERD (gastroesophageal reflux disease) 08/15/2017   Trigger finger 06/09/2017   Nocturnal leg movements 03/25/2017   Osteopenia 02/01/2017   Advanced care planning/counseling discussion 01/02/2017   Chronic kidney disease, stage 3a ([name]) 08/26/2016   Insomnia 08/26/2016   Suicidal ideation    Vitamin B12 deficiency 11/10/2015   Fatty liver disease, nonalcoholic 09/23/2014    Chronic diastolic CHF (congestive heart failure) ([name]) 08/27/2014   Positive hepatitis C antibody test 08/27/2014   Iron deficiency 08/27/2014   NSVT (nonsustained ventricular tachycardia) ([name])    Nausea without vomiting    Autonomic orthostatic hypotension 07/11/2014   S/P CABG x 3 07/08/2014   History of cerebrovascular accident (CVA) in adulthood 07/08/2014   Type 2 diabetes mellitus with neurological complications ([name]) 06/02/2012   Itching 03/16/2012   Contact dermatitis 02/27/2012   Medicare annual wellness visit, initial 10/28/2011   Hyperlipidemia associated with type 2 diabetes mellitus ([name])    Idiopathic angioedema 06/23/2011   Vitiligo 06/23/2011   Dermatitis 06/23/2011   Essential hypertension 05/08/2006   Home Medication(s) Prior to Admission medications   Medication Sig Start Date End Date Taking? Authorizing Provider  Accu-Chek Softclix Lancets lancets Use to check sugars twice daily as needed 05/15/23   Eustaquio Boyden, MD  acetaminophen (TYLENOL) 650 MG CR tablet Take 1,300 mg by mouth every 6 (six) hours as needed for pain.    [provider]  albuterol (PROVENTIL) (2.5 MG/3ML) 0.083% nebulizer solution Inhale 3 mLs into the lungs every 6 (six) hours as needed for wheezing or shortness of breath. Patient not taking: Reported on 05/16/2023 03/26/23   Verner Chol, MD  albuterol (VENTOLIN HFA) 108 (90 Base) MCG/ACT inhaler Inhale 1-2 puffs into the lungs every 6 (six) hours as needed for wheezing or shortness of breath. 05/15/23   Eustaquio Boyden, MD  apixaban (ELIQUIS) 5 MG TABS tablet Take 1 tablet (5 mg total) by mouth 2 (two) times daily. 05/15/23   Eustaquio Boyden, MD  atorvastatin (LIPITOR) 40 MG tablet Take 1 tablet (40 mg total) by mouth every evening. 05/15/23   Eustaquio Boyden, MD  cephALEXin (KEFLEX) 500 MG capsule Take 1 capsule (500 mg total) by mouth 2 (two) times daily. Patient not taking: Reported on 05/16/2023 05/15/23   Eustaquio Boyden, MD   clopidogrel (PLAVIX) 75 MG tablet Take 1 tablet (75 mg total) by mouth daily. 05/15/23   Eustaquio Boyden, MD  dorzolamide-timolol (COSOPT) 2-0.5 % ophthalmic solution Place 1 drop into both eyes 2 (two) times daily. 04/24/23   [provider]  DULoxetine (CYMBALTA) 60 MG capsule Take 1 capsule (60 mg total) by mouth daily. 05/15/23   Eustaquio Boyden, MD  feeding supplement, GLUCERNA SHAKE, (GLUCERNA SHAKE) LIQD Take 237 mLs by mouth 3 (three) times daily between meals. Patient taking differently: Take 237 mLs by mouth in the morning and at bedtime. 03/26/23   Verner Chol, MD  gabapentin (NEURONTIN) 300 MG capsule Take 1 capsule (300 mg total) by mouth 2 (two) times daily. 05/15/23   Eustaquio Boyden, MD  glipiZIDE (GLUCOTROL) 10 MG tablet Take 1 tablet (10 mg total) by mouth 2 (two) times daily. Always take with food 05/15/23   Eustaquio Boyden, MD  hydrocortisone cream 1 % Apply topically 3 (three) times daily. Patient taking differently: Apply 1 Application topically 2 (two) times daily as needed for itching. 03/26/23   Verner Chol, MD  isosorbide mononitrate (IMDUR) 30 MG 24 hr tablet Take 0.5  tablets (15 mg total) by mouth daily. 05/15/23   Eustaquio Boyden, MD  latanoprost (XALATAN) 0.005 % ophthalmic solution Place 1 drop into both eyes at bedtime. 03/26/23   Verner Chol, MD  linagliptin (TRADJENTA) 5 MG TABS tablet Take 1 tablet (5 mg total) by mouth daily. Patient not taking: Reported on 05/16/2023 05/15/23   Eustaquio Boyden, MD  melatonin 5 MG TABS Take 5 mg by mouth at bedtime as needed (sleep).    [provider]  metFORMIN (GLUCOPHAGE-XR) 500 MG 24 hr tablet Take 1 tablet (500 mg total) by mouth daily with breakfast. 05/15/23   Eustaquio Boyden, MD  metoprolol succinate (TOPROL-XL) 25 MG 24 hr tablet Take 1 tablet (25 mg total) by mouth daily. 05/15/23   Eustaquio Boyden, MD  nitroGLYCERIN (NITROSTAT) 0.4 MG SL tablet Place 0.4 mg under the tongue every 5 (five)  minutes as needed for chest pain. 04/04/23   [provider]  ondansetron (ZOFRAN) 4 MG tablet Take 4 mg by mouth every 8 (eight) hours as needed for nausea or vomiting.    [provider]  pantoprazole (PROTONIX) 40 MG tablet Take 1 tablet (40 mg total) by mouth daily. Patient taking differently: Take 40 mg by mouth 2 (two) times daily. 05/15/23   Eustaquio Boyden, MD  phenazopyridine (PYRIDIUM) 100 MG tablet Take 100 mg by mouth daily as needed for pain.    [provider]  QUEtiapine (SEROQUEL) 50 MG tablet Take 50 mg by mouth at bedtime. Patient not taking: Reported on 05/16/2023    [provider]  risperiDONE (RISPERDAL) 1 MG tablet Take 1 tablet (1 mg total) by mouth in the morning. With 2mg  at night Patient not taking: Reported on 05/16/2023 05/15/23   Eustaquio Boyden, MD  risperiDONE (RISPERDAL) 2 MG tablet Take 1 tablet (2 mg total) by mouth at bedtime. With 1mg  in am Patient not taking: Reported on 05/16/2023 05/15/23   Eustaquio Boyden, MD  Saccharomyces boulardii (PROBIOTIC) 250 MG CAPS Take 250 mg by mouth daily.    [provider]  traZODone (DESYREL) 50 MG tablet Take 50 mg by mouth at bedtime. Patient not taking: Reported on 05/16/2023    [provider]                                                                                                                                    Allergies Bee venom, Mushroom extract complex (obsolete), Penicillins, Codeine, Sulfa antibiotics, Iohexol, and Erythromycin base  Review of Systems Review of Systems As noted in HPI  Physical Exam Vital Signs  I have reviewed the triage vital signs BP (!) 141/81   Pulse 96   Temp 98.2 F (36.8 C)   Resp 16   SpO2 100%   Physical Exam Vitals reviewed.  Constitutional:      General: She is not in acute distress.    Appearance: She is well-developed. She is not diaphoretic.  HENT:     Head: Normocephalic and atraumatic.     Right Ear:  External ear normal.     Left Ear: External ear normal.     Nose: Nose normal.  Eyes:     General: No scleral icterus.    Conjunctiva/sclera: Conjunctivae normal.  Neck:     Trachea: Phonation normal.  Cardiovascular:     Rate and Rhythm: Normal rate and regular rhythm.  Pulmonary:     Effort: Pulmonary effort is normal. No respiratory distress.     Breath sounds: No stridor.  Abdominal:     General: There is no distension.     Tenderness: There is no abdominal tenderness.  Musculoskeletal:        General: Normal range of motion.     Cervical back: Normal range of motion.  Skin:    Findings: Rash present. Rash is macular.       Neurological:     Mental Status: She is alert and oriented to person, place, and time.  Psychiatric:        Behavior: Behavior normal.     ED Results and Treatments Labs (all labs ordered are listed, but only abnormal results are displayed) Labs Reviewed  COMPREHENSIVE METABOLIC PANEL - Abnormal; Notable for the following components:      Result Value   Sodium 134 (*)    CO2 18 (*)    Glucose, Bld 294 (*)    Creatinine, Ser 1.50 (*)    Calcium 8.6 (*)    GFR, Estimated 35 (*)    All other components within normal limits  SALICYLATE LEVEL - Abnormal; Notable for the following components:   Salicylate Lvl <7.0 (*)    All other components within normal limits  ACETAMINOPHEN LEVEL - Abnormal; Notable for the following components:   Acetaminophen (Tylenol), Serum <10 (*)    All other components within normal limits  CBC - Abnormal; Notable for the following components:   Hemoglobin 11.6 (*)    All other components within normal limits  ETHANOL  RAPID URINE DRUG SCREEN, HOSP PERFORMED                                                                                                                         EKG  EKG Interpretation Date/Time:    Ventricular Rate:    PR Interval:    QRS Duration:    QT Interval:    QTC Calculation:   R  Axis:      Text Interpretation:         Radiology No results found.  Medications Ordered in ED Medications  acetaminophen (TYLENOL) tablet 650 mg (650 mg Oral Given 05/28/23 0508)  albuterol (VENTOLIN HFA) 108 (90 Base) MCG/ACT inhaler 1-2 puff (has no administration in time range)  atorvastatin (LIPITOR) tablet 40 mg (has no administration in time range)  clopidogrel (PLAVIX) tablet 75 mg (has no administration in time range)  gabapentin (NEURONTIN) capsule 300 mg (has no administration in  time range)  melatonin tablet 5 mg (has no administration in time range)  metFORMIN (GLUCOPHAGE-XR) 24 hr tablet 500 mg (has no administration in time range)  apixaban (ELIQUIS) tablet 5 mg (has no administration in time range)  diphenhydrAMINE (BENADRYL) capsule 25 mg (25 mg Oral Given 05/28/23 5956)   Procedures Procedures  (including critical care time) Medical Decision Making / ED Course   Medical Decision Making Amount and/or Complexity of Data Reviewed Labs: ordered. Decision-making details documented in ED Course.  Risk OTC drugs. Prescription drug management.    Patient presents for suicidal ideations without active plan.  History of depression.  . Medical screening labs grossly reassuring.  No leukocytosis or significant anemia.  CMP without significant electrolyte derangements.   hyperglycemia without DKA.  Mild renal sufficiency without overt AKI. Rash appears to be contact irritation.  She does have new-year-old excoriations. No superimposed infection appreciated. Barrier cream applied. Benadryl for itching.  Home meds ordered. [facility] consulted.       Final Clinical Impression(s) / ED Diagnoses Final diagnoses:  Suicidal ideation  Depression, unspecified depression type    This chart was dictated using voice recognition software.  Despite best efforts to proofread,  errors can occur which can change the documentation meaning.    Nira Conn, MD 05/28/23  [phone]

## 2023-05-28 NOTE — Telephone Encounter (Signed)
 Attempted to contact pt. No answer, vm box full. Need to get answer to Dr Timoteo Expose question and relay his message.

## 2023-05-28 NOTE — ED Notes (Signed)
 Cleaned pt up with soap/water, had diarrhea, clean brief, bottom was red put barrier cream, redness to pt back pt stated she had been scratching RN made aware of the redness

## 2023-05-28 NOTE — Progress Notes (Signed)
 PT Cancellation Note  Patient Details Name: Maria Fox MRN: [id number] DOB: 1946-02-06   Cancelled Treatment:    Reason Eval/Treat Not Completed:  Attempted PT eval-pt refused to participate despite encouragement and explanation for visit. Will check back as schedule allow-likley another day.    Faye Ramsay, PT Acute Rehabilitation  Office: [phone]

## 2023-05-28 NOTE — ED Notes (Signed)
 Provided pt with a [place] sandwich and apple juice
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
## 2023-05-28 NOTE — Patient Instructions (Signed)
 Visit Information  Thank you for taking time to visit with me today. Please don't hesitate to contact me if I can be of assistance to you.   Following are the goals we discussed today:   Goals Addressed             This Visit's Progress    care coordination activities       Activities and task to complete in order to accomplish goals.   EMOTIONAL / MENTAL HEALTH SUPPORT CSW to continue to follow up with patient regarding mental health and community resource needs post discharge from the hospital CSW will confirm insurance coverage for possible referral to the ACT-Assertive Community Treatment team CSW to continue to collaborate with [facility] and inpatient staff regarding discharge plan         Our next appointment is by telephone on 06/04/23 at 2:30pm  Please call the care guide team at [phone] if you need to cancel or reschedule your appointment.   If you are experiencing a Mental Health or Behavioral Health Crisis or need someone to talk to, please call the Suicide and Crisis Lifeline: 988   Patient verbalizes understanding of instructions and care plan provided today and agrees to view in MyChart. Active MyChart status and patient understanding of how to access instructions and care plan via MyChart confirmed with patient.     Telephone follow up appointment with care management team member scheduled for: 06/04/23  Verna Czech, LCSW [place]  [facility], [facility] Licensed Clinical Social Worker Care Coordinator  Direct Dial: [phone]

## 2023-05-28 NOTE — ED Notes (Signed)
 Pt belongings in cabinet 16-18

## 2023-05-28 NOTE — Patient Outreach (Addendum)
 Care Coordination   Follow Up Visit Note   05/28/2023 Name: Maria Fox MRN: [id number] DOB: [birth year]  Maria Fox is a 78 y.o. year old female who sees Maria Boyden, MD for primary care. I spoke with  Maria Fox's significant other Maria Fox by phone today.  What matters to the patients health and wellness today?  Potential follow up plan upon discharge from the hospital. Patient currently in the ED due to suicide ideation. Patient currently resides with significant other who is planning on selling his home however is willing for patient to accompany him if she chooses. Significant other reports concern regarding patient's lack of motivation and frequent reports of self harm. Encouraged the coordination of a discharge plan before discharge home from hospital.   Goals Addressed             This Visit's Progress    care coordination activities       Activities and task to complete in order to accomplish goals.   EMOTIONAL / MENTAL HEALTH SUPPORT CSW to continue to follow up with patient regarding mental health and community resource needs post discharge from the hospital CSW will confirm insurance coverage for possible referral to the ACT-Assertive Community Treatment team CSW to continue to collaborate with [facility] and inpatient staff regarding discharge plan         SDOH assessments and interventions completed:  Yes  SDOH Interventions Today    Flowsheet Row Most Recent Value  SDOH Interventions   Food Insecurity Interventions Intervention Not Indicated  Housing Interventions Intervention Not Indicated  Transportation Interventions Intervention Not Indicated, Patient Resources (Friends/Family)  Utilities Interventions Intervention Not Indicated        Care Coordination Interventions:  Yes, provided  Interventions Today    Flowsheet Row Most Recent Value  Chronic Disease   Chronic disease during today's visit Other  [MDD]  General Interventions   General  Interventions Discussed/Reviewed General Interventions Discussed, [employer], Communication with  [facility] follow up needs assessed-Pt currently in ED due to reports of SI-per sigfig since heart attack in 2016 pt lacks motivation for self care , incontient , poor appetite, sig/fig confirms plan to  sell home- pt able to move with him if she chooses]  Doctor Visits Discussed/Reviewed Doctor Visits Discussed, PCP  [Follow up with PCP scheduled for today-will need to be re-scheduled]  Communication with RN  [facility] phone call with signig other re: plan for pt post discharge]  Education Interventions   Education Provided Provided Education  Provided Verbal Education On Mental Health/Coping with Illness  [importance of plan for discharge from hospital discussed ie ongooing mental health follow up(outpt, ACT Team) long term care options]  Mental Health Interventions   Mental Health Discussed/Reviewed Mental Health Discussed, Depression  [Per significant other, refuses follow up mental health therapy]  Safety Interventions   Safety Discussed/Reviewed Safety Discussed       Follow up plan: Follow up call scheduled for 3/36/25    Encounter Outcome:  Patient Visit Completed

## 2023-05-28 NOTE — ED Triage Notes (Signed)
 Patient BIB EMS from home c/o diarrhea and Suicidal Ideations. Per report patient was found tearful outside her house and feces all over her body. Hx of depression, hallucinations.

## 2023-05-28 NOTE — ED Notes (Signed)
 Report called to [facility], Charity fundraiser. Pt to go to room 31

## 2023-05-28 NOTE — ED Notes (Signed)
 Patient has been sleeping since being brought back to the TCU area, aside from when she ate her lunch independently. Patient was asked if she would like to walk, she refused. She said that she would like to but she's tired and does not feel good. In previous encounters with patient, she was able to perform ADL's independently, but currently it is difficult to determine the level of assistance that she needs. Patient has been clean and dry since she has been brought back to the TCU area.

## 2023-05-29 LAB — CBG MONITORING, ED: Glucose-Capillary: 159 mg/dL — ABNORMAL HIGH (ref 70–99)

## 2023-05-29 NOTE — Progress Notes (Signed)
 CSW contacted Dorene Sorrow with APS to inquire about whether pt has been assigned a placement Child psychotherapist with DSS.

## 2023-05-29 NOTE — Progress Notes (Signed)
 PT Cancellation Note  Patient Details Name: Maria Fox MRN: [id number] DOB: 12-08-1945   Cancelled Treatment:     PT order received and eval attempted this am.  On entering room, pt acknowledged PT with "hello" and then closed eyes and refused to respond any further.  Will follow.   Wylma Tatem 05/29/2023, 11:35 AM

## 2023-05-29 NOTE — Progress Notes (Signed)
 LCSW Progress Note  [id number]   Maria Fox  05/29/2023  11:13 AM  Description:   Inpatient Psychiatric Referral  Patient was recommended inpatient per [facility], PMHNP  There are no available beds at [facility], per [facility] Rona Ravens RN. Patient was referred to the following out of network facilities:   Destination  Service Provider Address Phone Fax  [facility] [address] [place], [place] [address] [phone]  [facility] [address], [place] [address] [phone]  [facility] [address], [place] [address] [phone]  [facility] EFAX [address] Karolee Ohs [place] [phone]  [facility] [address] [place] [address] [phone]      Situation ongoing, CSW to continue following and update chart as more information becomes available.    [place] [name], MSW, LCSW  05/29/2023 11:13 AM

## 2023-05-29 NOTE — ED Provider Notes (Signed)
 Emergency Medicine Observation Re-evaluation Note  Maria Fox is a 78 y.o. female, seen on rounds today.  Pt initially presented to the ED for complaints of Suicidal and Diarrhea Currently, the patient is resting; no complaints   Physical Exam  BP 120/69 (BP Location: Right Arm)   Pulse 83   Temp 97.6 F (36.4 C) (Axillary)   Resp 16   SpO2 98%  Physical Exam General: NAD Cardiac: RR Lungs: Non-labored Psych: Calm  ED Course / MDM  EKG:   I have reviewed the labs performed to date as well as medications administered while in observation.  Recent changes in the last 24 hours include none.  Plan  Current plan is for inpatient psych.    Coral Spikes, DO 05/29/23 [phone]

## 2023-05-29 NOTE — Evaluation (Signed)
 Physical Therapy Evaluation Patient Details Name: Maria Fox MRN: [id number] DOB: 03/26/1945 Today's Date: 05/29/2023  History of Present Illness  Pt admitted from home 2* SI and diarrhea  Clinical Impression  Pt admitted as above and currently demonstrating MOD with bed mobility and CGA with safety cues for all OOB activity including up to bathroom for toileting with hand hygiene performed standing at sink.  Pt very pleasant and cooperative this pm.  Plan at present is for in-patient psychiatric.       If plan is discharge home, recommend the following: A little help with walking and/or transfers;A little help with bathing/dressing/bathroom;Assistance with cooking/housework;Assist for transportation;Help with stairs or ramp for entrance   Can travel by private vehicle        Equipment Recommendations None recommended by PT  Recommendations for Other Services       Functional Status Assessment Patient has had a recent decline in their functional status and demonstrates the ability to make significant improvements in function in a reasonable and predictable amount of time.     Precautions / Restrictions Precautions Precautions: Fall Restrictions Weight Bearing Restrictions Per Provider Order: No      Mobility  Bed Mobility Overal bed mobility: Modified Independent             General bed mobility comments: No physical assist    Transfers Overall transfer level: Needs assistance Equipment used: Rolling walker (2 wheels) Transfers: Sit to/from Stand Sit to Stand: Contact guard assist, Supervision           General transfer comment: Steady assist only    Ambulation/Gait Ambulation/Gait assistance: Contact guard assist Gait Distance (Feet): 160 Feet Assistive device: Rolling walker (2 wheels) Gait Pattern/deviations: Step-through pattern, Decreased step length - right, Decreased step length - left, Shuffle, Trunk flexed Gait velocity: mod pace     General  Gait Details: cues for posture, position from RW and safety awareness  Stairs            Wheelchair Mobility     Tilt Bed    Modified Rankin (Stroke Patients Only)       Balance Overall balance assessment: Needs assistance Sitting-balance support: No upper extremity supported, Feet supported Sitting balance-Leahy Scale: Good     Standing balance support: No upper extremity supported Standing balance-Leahy Scale: Fair                               Pertinent Vitals/Pain Pain Assessment Pain Assessment: No/denies pain    Home Living Family/patient expects to be discharged to:: Private residence Living Arrangements: Spouse/significant other Available Help at Discharge: Family;Available 24 hours/day;Available PRN/intermittently Type of Home: House Home Access: Stairs to enter Entrance Stairs-Rails: Right;Left Entrance Stairs-Number of Steps: 2   Home Layout: One level Home Equipment: Agricultural consultant (2 wheels);Shower seat      Prior Function Prior Level of Function : Needs assist             Mobility Comments: Pt states uses RW sometimes       Extremity/Trunk Assessment   Upper Extremity Assessment Upper Extremity Assessment: Overall WFL for tasks assessed    Lower Extremity Assessment Lower Extremity Assessment: Overall WFL for tasks assessed    Cervical / Trunk Assessment Cervical / Trunk Assessment: Kyphotic  Communication   Communication Communication: No apparent difficulties    Cognition Arousal: Alert Behavior During Therapy: WFL for tasks assessed/performed, Flat affect, Impulsive   PT -  Cognitive impairments: Safety/Judgement                       PT - Cognition Comments: Repeated cues for basic safety awarenss Following commands: Intact       Cueing Cueing Techniques: Verbal cues     General Comments      Exercises     Assessment/Plan    PT Assessment Patient needs continued PT services  PT  Problem List Decreased activity tolerance;Decreased balance;Decreased knowledge of use of DME       PT Treatment Interventions DME instruction;Gait training;Functional mobility training;Therapeutic activities;Therapeutic exercise;Balance training;Patient/family education    PT Goals (Current goals can be found in the Care Plan section)  Acute Rehab PT Goals Patient Stated Goal: walk PT Goal Formulation: With patient Time For Goal Achievement: 06/11/23 Potential to Achieve Goals: Good    Frequency Min 3X/week     Co-evaluation               AM-PAC PT "6 Clicks" Mobility  Outcome Measure Help needed turning from your back to your side while in a flat bed without using bedrails?: None Help needed moving from lying on your back to sitting on the side of a flat bed without using bedrails?: None Help needed moving to and from a bed to a chair (including a wheelchair)?: None Help needed standing up from a chair using your arms (e.g., wheelchair or bedside chair)?: A Little Help needed to walk in hospital room?: A Little Help needed climbing 3-5 steps with a railing? : A Little 6 Click Score: 21    End of Session Equipment Utilized During Treatment: Gait belt Activity Tolerance: Patient tolerated treatment well Patient left: in bed;with call bell/phone within reach Nurse Communication: Mobility status PT Visit Diagnosis: Difficulty in walking, not elsewhere classified (R26.2)    Time: 6578-4696 PT Time Calculation (min) (ACUTE ONLY): 17 min   Charges:   PT Evaluation $PT Eval Low Complexity: 1 Low   PT General Charges $$ ACUTE PT VISIT: 1 Visit         Mauro Kaufmann PT Acute Rehabilitation Services Pager [phone] Office [phone]   Daiel Strohecker 05/29/2023, 2:54 PM

## 2023-05-29 NOTE — Progress Notes (Signed)
 Patient has been denied by [facility] due to no appropriate beds available. Patient meets BH inpatient criteria per [facility], PHMNP. Patient has been faxed out to the following facilities:   [facility] [address] [place], [place] [address] [phone]  [facility] [address] [place] [address] [phone]  [facility] [address] [place] [address] [phone]  [facility] EFAX [address] [place], [place] [phone]  [facility] [address] [place] [address] [phone]   [name], MSW, LCSW-A  7:52 PM 05/29/2023

## 2023-05-30 DIAGNOSIS — R45851 Suicidal ideations: Secondary | ICD-10-CM | POA: Diagnosis not present

## 2023-05-30 DIAGNOSIS — F329 Major depressive disorder, single episode, unspecified: Secondary | ICD-10-CM

## 2023-05-30 NOTE — ED Provider Notes (Signed)
 Emergency Medicine Observation Re-evaluation Note  Maria Fox is a 78 y.o. female, seen on rounds today.  Pt initially presented to the ED for complaints of Suicidal and Diarrhea Currently, the patient is sleeping.  Physical Exam  BP 136/80 (BP Location: Right Arm)   Pulse 93   Temp 98 F (36.7 C) (Oral)   Resp 17   SpO2 98%  Physical Exam General: nad Cardiac: regular Lungs: clear Psych: sleeping but has been calm and cooperative  ED Course / MDM  EKG:   I have reviewed the labs performed to date as well as medications administered while in observation.  Recent changes in the last 24 hours include [facility] has declined admission there.  Plan  Current plan is for inpt psych.    Gwyneth Sprout, MD 05/30/23 [phone]

## 2023-05-30 NOTE — Evaluation (Signed)
 Occupational Therapy Evaluation Patient Details Name: Maria Fox MRN: [id number] DOB: [birth year] Today's Date: 05/30/2023   History of Present Illness   Pt admitted from home 2* SI and diarrhea. Past medical history of  hyperlipidemia, atrial flutter, primary open angle glaucoma, chronic kidney disease stage 3, osteopenia, peripheral neuropathy, DM2, GERD, CAD, CHF, and HTN.     Clinical Impressions Patient evaluated by Occupational Therapy with no further acute OT needs identified. All education has been completed and the patient has no further questions. OT spent a little time with pt after ADL evaluation encouraging re-starting any previously enjoyed hobbies even if Jae Dire is a factor for now. Pt agreed that once she has acecss to craft supplies at behavioral health, she will give it a try.  See below for any follow-up Occupational Therapy or equipment needs. OT is signing off. Thank you for this referral.      If plan is discharge home, recommend the following:   A little help with walking and/or transfers;Assistance with cooking/housework     Functional Status Assessment   Patient has had a recent decline in their functional status and demonstrates the ability to make significant improvements in function in a reasonable and predictable amount of time.     Equipment Recommendations   None recommended by OT     Recommendations for Other Services         Precautions/Restrictions   Precautions Precautions: Fall Precaution/Restrictions Comments: 1:1 suicide precautions. Restrictions Weight Bearing Restrictions Per Provider Order: No     Mobility Bed Mobility Overal bed mobility: Modified Independent             General bed mobility comments: No physical assist    Transfers                          Balance Overall balance assessment: Needs assistance Sitting-balance support: No upper extremity supported, Feet supported Sitting  balance-Leahy Scale: Good     Standing balance support: No upper extremity supported Standing balance-Leahy Scale: Fair                             ADL either performed or assessed with clinical judgement   ADL Overall ADL's : Needs assistance/impaired Eating/Feeding: Independent   Grooming: Standing;Wash/dry face;Oral care;Wash/dry hands;Brushing hair Grooming Details (indicate cue type and reason): Pt requested grooming supplies and 1:1 sitter assisted. Pt ambulated without her RW to thesink and performed above tasks with supervision only. Once ambulating back to her bed pt had one stagger but self corrected without assistance. Pt agreed that likely would not have occured if she was on the walker. Upper Body Bathing: Modified independent   Lower Body Bathing: Modified independent;Sitting/lateral leans   Upper Body Dressing : Modified independent   Lower Body Dressing: Supervision/safety;Sitting/lateral leans;Sit to/from stand;Set up   Toilet Transfer: Supervision/safety;Contact guard assist;Ambulation   Toileting- Clothing Manipulation and Hygiene: Modified independent       Functional mobility during ADLs: Supervision/safety;Contact guard assist       Vision   Vision Assessment?: No apparent visual deficits     Perception         Praxis         Pertinent Vitals/Pain Pain Assessment Pain Assessment: No/denies pain     Extremity/Trunk Assessment Upper Extremity Assessment Upper Extremity Assessment: Overall WFL for tasks assessed   Lower Extremity Assessment Lower Extremity Assessment: Overall WFL for tasks assessed  Cervical / Trunk Assessment Cervical / Trunk Assessment: Normal   Communication Communication Communication: No apparent difficulties Factors Affecting Communication: Hearing impaired (mildly)   Cognition Arousal: Alert Behavior During Therapy: WFL for tasks assessed/performed, Flat affect               OT - Cognition  Comments: Depressed mood. Pt spoke about her depression. She reports that she never has a psychiatrist, "as far as I know".  OT spent time encouraing and empowering pt to advocate for herself even when its difficult and to think of she and [name] as a team who will be able to help one another. Pt smiling and expressed understanding.                 Following commands: Intact       Cueing  General Comments          Exercises     Shoulder Instructions      Home Living Family/patient expects to be discharged to:: Private residence Living Arrangements: Non-relatives/Friends;Other (Comment) (Pt reports living with "my very good friend, [name]". Pt reports over 30 year friendship.) Available Help at Discharge: Family;Available 24 hours/day;Available PRN/intermittently Type of Home: House Home Access: Stairs to enter [employer] of Steps: 2 Entrance Stairs-Rails: Right;Left Home Layout: One level     Bathroom Shower/Tub: Chief Strategy Officer: Standard Bathroom Accessibility: Yes   Home Equipment: Agricultural consultant (2 wheels);Shower seat          Prior Functioning/Environment Prior Level of Function : Needs assist             Mobility Comments: Pt states uses RW sometimes ADLs Comments: States she is (I) with BADLs; [name] (significant other) does most IADLs (driving, cooking, housework, medication management); pills come in pill packets and [name] ensures that pt takes them correctly. Pt states that she enjoys watching TV Roselyn Reef, Justice Rocher, game shows), enjoys music (Reba Osborne Casco; and rock & roll). States she used to enjoy tap dancing and decopage crafts, crochett and flower arrangements, but have not done either of these recently. 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 Her significant other is also retired and home all the time.  Pt reports that [name] will be going for  "shoulder and back surgery" soon and she is worried about him and unsure of how IADLs will be completed.  Pt reports that she is aware of services like grocery delivery.    OT Problem List: Decreased activity tolerance;Impaired balance (sitting and/or standing)   OT Treatment/Interventions:        OT Goals(Current goals can be found in the care plan section)   Acute Rehab OT Goals OT Goal Formulation: All assessment and education complete, DC therapy   OT Frequency:       Co-evaluation              AM-PAC OT "6 Clicks" Daily Activity     Outcome Measure Help from another person eating meals?: None Help from another person taking care of personal grooming?: None Help from another person toileting, which includes using toliet, bedpan, or urinal?: None Help from another person bathing (including washing, rinsing, drying)?: A Little Help from another person to put on and taking off regular upper body clothing?: None Help from another person to put on and taking off regular lower body clothing?: A Little 6 Click Score: 22   End of Session Equipment Utilized During Treatment:  Gait belt  Activity Tolerance: Patient tolerated treatment well Patient left: in bed;Other (comment) (1:1 sitter just outside door)  OT Visit Diagnosis: Unsteadiness on feet (R26.81)                Time: 1353-1410 OT Time Calculation (min): 17 min Charges:  OT General Charges $OT Visit: 1 Visit OT Evaluation $OT Eval Low Complexity: 1 Low  Shante Maysonet, OT Acute Rehab Services Office: [phone] 05/30/2023   Theodoro Clock 05/30/2023, 2:42 PM
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
## 2023-05-30 NOTE — Progress Notes (Addendum)
 LCSW Progress Note:  MRN: [id number]  Maria Fox  05/30/2023 3:58 PM   Patient has been denied by [facility] due to no appropriate beds available. Patient meets BH inpatient criteria per [facility], PHMNP. Patient has been re-faxed out to the following facilities:   [facility] [address] [place], [place] [address] [phone]  [facility] [address] [place] [address] [phone]  [facility] [address], [place] [address] [phone]  [facility] EFAX [address] [place], [place] [phone]  [facility] [address] [place], [place] [address] [phone]

## 2023-05-30 NOTE — ED Notes (Signed)
 Patient ambulatory back and forth to bathroom with walker

## 2023-05-31 LAB — CBG MONITORING, ED
Glucose-Capillary: 190 mg/dL — ABNORMAL HIGH (ref 70–99)
Glucose-Capillary: 255 mg/dL — ABNORMAL HIGH (ref 70–99)
Glucose-Capillary: 187 mg/dL — ABNORMAL HIGH (ref 70–99)

## 2023-05-31 MED ORDER — CARMEX CLASSIC LIP BALM EX OINT
TOPICAL_OINTMENT | Freq: Once | CUTANEOUS | Status: AC
  Filled 2023-05-31: qty 10

## 2023-05-31 NOTE — Progress Notes (Signed)
 CSW contacted [name] to check bed availability but there was no answer. CSW LVM asking the facility to return the call. CSW is awaiting to hear back from the facility.    [name], MSW, LCSW-A  11:49 AM 05/31/2023

## 2023-05-31 NOTE — Progress Notes (Signed)
 Mobility Specialist - Progress Note   05/31/23 1500  Mobility  Activity Ambulated with assistance in hallway  Level of Assistance Standby assist, set-up cues, supervision of patient - no hands on  Assistive Device Front wheel walker  Distance Ambulated (ft) 150 ft  Activity Response Tolerated well  Mobility Referral Yes  Mobility visit 1 Mobility  Mobility Specialist Start Time (ACUTE ONLY) 1437  Mobility Specialist Stop Time (ACUTE ONLY) 1444  Mobility Specialist Time Calculation (min) (ACUTE ONLY) 7 min   Pt received in bed and agreeable to mobility. No complaints during session. Pt to bed after session with all needs met.    [facility]

## 2023-05-31 NOTE — ED Notes (Signed)
 This Nurse received pt resting in bed, natural raise and fall of chest noted as well as no signs of distress. Pt bed set to lowest setting, call bell at reach pt door is closed and sitter seated adjacent to pt door.

## 2023-05-31 NOTE — Progress Notes (Signed)
 Patient has been denied by [facility] due to no appropriate beds available. Patient meets BH inpatient criteria per [facility], PHMNP. Patient has been faxed out to the following facilities:   [facility] [address] [place], [place] [address] [phone] [facility] [address], [place] [address] [phone] [facility] [address], [place] [address] [phone] [facility] EFAX [address] [place], [place] [phone] [facility] [address] [place] [address] [phone]   [name], MSW, LCSW-A  6:19 PM 05/31/2023
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
## 2023-05-31 NOTE — ED Notes (Signed)
 MD made aware of [id number]

## 2023-05-31 NOTE — ED Provider Notes (Signed)
 Emergency Medicine Observation Re-evaluation Note  Maria Fox is a 78 y.o. female, seen on rounds today.  Pt initially presented to the ED for complaints of feeling depressed. No new c/o this AM.   Physical Exam  BP (!) 111/59 (BP Location: Right Arm)   Pulse 74   Temp (!) 97.3 F (36.3 C) (Oral)   Resp 18   SpO2 97%  Physical Exam General: calm Cardiac: regular rate Lungs: breathing comfortably Psych: calm  ED Course / MDM   I have reviewed the labs performed to date as well as medications administered while in observation.  Recent changes in the last 24 hours include ED obs, reassessment.   Plan  TOC and BH teams following patient.  It appears patient needs to transition out of ED, either to [facility] or extended care facility - dispo per toc/bh teams.       Cathren Laine, MD 05/31/23 [phone]

## 2023-06-01 DIAGNOSIS — F332 Major depressive disorder, recurrent severe without psychotic features: Secondary | ICD-10-CM | POA: Diagnosis not present

## 2023-06-01 DIAGNOSIS — F322 Major depressive disorder, single episode, severe without psychotic features: Secondary | ICD-10-CM

## 2023-06-01 MED ORDER — DULOXETINE HCL 30 MG PO CPEP
30.0000 mg | ORAL_CAPSULE | Freq: Every day | ORAL | Status: DC
  Administered 2023-06-01 – 2023-06-05 (×5): 30 mg via ORAL
  Filled 2023-06-01 (×5): qty 1

## 2023-06-01 NOTE — Progress Notes (Signed)
 CSW contacted [facility] Intake [facility] regarding the [facility] referral. Per Britta Mccreedy, there was no Intake RN on site yesterday and she currently processing all received [facility] referrals. Britta Mccreedy has agreed to be on the look out for the [facility] referral and to process it as soon as possible. CSW will continue to monitor the [facility] referral to secure recommended disposition.    [name], MSW, LCSW-A  10:15 AM 06/01/2023
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
## 2023-06-01 NOTE — ED Provider Notes (Signed)
 Emergency Medicine Observation Re-evaluation Note  Maria Fox is a 78 y.o. female, seen on rounds today.  Pt initially presented to the ED for complaints of Suicidal and Diarrhea Currently, the patient is awaiting TOC placement.  Patient does not meet criteria for behavioral health admission as per behavioral health..  Physical Exam  BP 124/73 (BP Location: Right Arm)   Pulse 78   Temp 98 F (36.7 C) (Oral)   Resp 18   SpO2 98%  Physical Exam General: Nontoxic no acute distress resting comfortably Cardiac:  Lungs: No respiratory distress Psych: Resting comfortably baseline behavior.  ED Course / MDM  EKG:EKG Interpretation Date/Time:  Saturday May 31 2023 06:37:02 EDT Ventricular Rate:  68 PR Interval:  164 QRS Duration:  132 QT Interval:  410 QTC Calculation: 435 R Axis:   -59  Text Interpretation: Normal sinus rhythm Right bundle branch block unchanged Confirmed by Palumbo, April ([id number]) on 05/31/2023 6:40:09 AM  I have reviewed the labs performed to date as well as medications administered while in observation.  Recent changes in the last 24 hours include significant changes in the past 24 hours other than behavioral health stating she does not meet criteria for behavioral health admission..  Plan  Current plan is for TOC placement.    Vanetta Mulders, MD 06/01/23 [phone]

## 2023-06-01 NOTE — Progress Notes (Signed)
 CSW followed up with [name] regarding bed availability and the status of the [facility] referral. Per Britta Mccreedy, she still has not reviewed the referral but has agreed to do so.    [name], MSW, LCSW-A  4:29 PM 06/01/2023

## 2023-06-01 NOTE — Progress Notes (Signed)
 Mobility Specialist - Progress Note   06/01/23 1223  Mobility  Activity Ambulated with assistance in hallway  Level of Assistance Standby assist, set-up cues, supervision of patient - no hands on  Assistive Device Front wheel walker  Distance Ambulated (ft) 120 ft  Activity Response Tolerated well  Mobility Referral Yes  Mobility visit 1 Mobility  Mobility Specialist Start Time (ACUTE ONLY) 1206  Mobility Specialist Stop Time (ACUTE ONLY) 1216  Mobility Specialist Time Calculation (min) (ACUTE ONLY) 10 min   Pt received in bed and agreeable to mobility. Pt was minA from supine>sitting & minA from STS. No complaints during session. Pt to bed after session with all needs met.    [facility]

## 2023-06-02 NOTE — Progress Notes (Signed)
 LCSW Progress Note  [id number]   Maria Fox  06/02/2023  12:00 PM  Description:   Inpatient Psychiatric Referral  Patient was recommended inpatient per Dahlia Byes NP. There are no available beds at [facility], per [facility] Rona Ravens RN. Patient was referred to the following out of network facilities:   Destination  Service Provider Address Phone Fax  [facility] [address] [place], [place] [address] [phone]  [facility] [address], [place] [address] [phone]  [facility] [address], [place] [address] [phone]  [facility] EFAX [address] [place] [phone]  [facility] [address] [place] [address] [phone]      Situation ongoing, CSW to continue following and update chart as more information becomes available.      [place] Wladyslawa Disbro MSW, LCSW  06/02/2023 12:00 PM

## 2023-06-02 NOTE — Progress Notes (Addendum)
 LCSW Progress Note:   [id number]  LANIYA FRIEDL 06/02/2023 8:13 PM   Patient was recommended inpatient per Dahlia Byes NP. There are no available beds at [facility], per [facility] Rona Ravens RN. Patient was referred to the following out of network facilities during this pm shift:   Destination  Service Provider   Address Phone Fax Patient Preferred  [facility]-Ger[facility]   [address] [place], [place] [address] [phone] --  [facility]   [address] [place] [address] [phone] --  [facility]   [address], [place] [address] [phone] --  [facility] EFAX   [address] [place], [place] [phone] --  [facility]   [address] [place], [place] [address] [phone] --  [facility] Patient Placement   [facility], [place] [phone] --  [facility]   [address] [place] [address] [phone] --  [facility] [address]   [address], [place] [address] [phone] --  [facility]   [address] [place] [address] [phone] --  [facility]   [address] [place], [place] [address] [phone] --  [facility] (0)   [address], [place] [address] [phone] --  [facility] Center-Adult   [address] [place], [place] [address] [phone] --  [facility]   [address], [place] [address] [phone] --  [facility]   [address] [place] [address] [phone] --  [facility]   [address], [place] [address] [phone] Lenice Pressman  [facility]   [place] [phone] --  [facility]   [address], [place] [address] [phone] --  [facility]   [address] [place] [address] [phone] --  [facility]   [address] [place] [address] [phone] --  [name]   [address], [place] [address] [phone] --  [facility]   [address] [place] [address] [phone] --  [facility]   [address] [place], [place] [address] [phone] --  [facility]   [address] [place] [address] [phone] --  [facility]   [address] [place] [address] [phone] --  [facility]   [place] [phone] -- --

## 2023-06-02 NOTE — Progress Notes (Signed)
 Physical Therapy Treatment Patient Details Name: Maria Fox MRN: [id number] DOB: [birth year] Today's Date: 06/02/2023   History of Present Illness Pt admitted from home 2* SI and diarrhea. Past medical history of  hyperlipidemia, atrial flutter, primary open angle glaucoma, chronic kidney disease stage 3, osteopenia, peripheral neuropathy, DM2, GERD, CAD, CHF, and HTN.    PT Comments  AxO x 2 pleasant and willing but flat effect and minimal conversation. Nursing staff, Pt received a shower earlier.  Pt feeling "tired" but willing to get OOB to amb.  Pt self able to rise and lower.  General Gait Details: cues for posture, position from RW and safety awareness then trial amb w/o AD as pt does not use within her home. Balance is fair.  Assisted back to bed per pt request. Pt appears close to her prior level of mobility.  TOC working on D/C.    If plan is discharge home, recommend the following: A little help with walking and/or transfers;A little help with bathing/dressing/bathroom;Assistance with cooking/housework;Assist for transportation;Help with stairs or ramp for entrance   Can travel by private vehicle        Equipment Recommendations  None recommended by PT    Recommendations for Other Services       Precautions / Restrictions Precautions Precautions: Fall Precaution/Restrictions Comments: 1:1 suicide precautions. Restrictions Weight Bearing Restrictions Per Provider Order: No     Mobility  Bed Mobility Overal bed mobility: Modified Independent             General bed mobility comments: No physical assist    Transfers Overall transfer level: Needs assistance Equipment used: Rolling walker (2 wheels) Transfers: Sit to/from Stand Sit to Stand: Contact guard assist, Supervision           General transfer comment: Steady assist only    Ambulation/Gait Ambulation/Gait assistance: Contact guard assist Gait Distance (Feet): 155 Feet Assistive device: Rolling  walker (2 wheels), None Gait Pattern/deviations: Step-through pattern, Decreased step length - right, Decreased step length - left, Shuffle, Trunk flexed Gait velocity: (P) mod pace     General Gait Details: cues for posture, position from RW and safety awareness then trial amb w/o AD as pt does not use within her home.   Stairs             Wheelchair Mobility     Tilt Bed    Modified Rankin (Stroke Patients Only)       Balance                                            Communication Communication Communication: No apparent difficulties Factors Affecting Communication: Hearing impaired  Cognition Arousal: Alert Behavior During Therapy: WFL for tasks assessed/performed, Flat affect   PT - Cognitive impairments: Safety/Judgement                       PT - Cognition Comments: AxO x 2 pleasant and willing but flat effect and minimal conversation. Following commands: Intact      Cueing Cueing Techniques: Verbal cues  Exercises      General Comments        Pertinent Vitals/Pain Pain Assessment Pain Assessment: No/denies pain    Home Living  Prior Function            PT Goals (current goals can now be found in the care plan section) Progress towards PT goals: Progressing toward goals    Frequency    Min 3X/week      PT Plan      Co-evaluation              AM-PAC PT "6 Clicks" Mobility   Outcome Measure  Help needed turning from your back to your side while in a flat bed without using bedrails?: None Help needed moving from lying on your back to sitting on the side of a flat bed without using bedrails?: None Help needed moving to and from a bed to a chair (including a wheelchair)?: None Help needed standing up from a chair using your arms (e.g., wheelchair or bedside chair)?: None Help needed to walk in hospital room?: A Little Help needed climbing 3-5 steps with a railing?  : A Little 6 Click Score: 22    End of Session Equipment Utilized During Treatment: Gait belt Activity Tolerance: Patient tolerated treatment well Patient left: in bed;with call bell/phone within reach Nurse Communication: Mobility status PT Visit Diagnosis: Difficulty in walking, not elsewhere classified (R26.2)     Time: 1454-1510 PT Time Calculation (min) (ACUTE ONLY): 16 min  Charges:    $Gait Training: 8-22 mins PT General Charges $$ ACUTE PT VISIT: 1 Visit                     Felecia Shelling  PTA Acute  Rehabilitation Services Office M-F          [phone]

## 2023-06-03 ENCOUNTER — Other Ambulatory Visit: Payer: Self-pay | Admitting: Family Medicine

## 2023-06-03 DIAGNOSIS — E1169 Type 2 diabetes mellitus with other specified complication: Secondary | ICD-10-CM

## 2023-06-03 DIAGNOSIS — F332 Major depressive disorder, recurrent severe without psychotic features: Secondary | ICD-10-CM | POA: Diagnosis not present

## 2023-06-03 NOTE — ED Provider Notes (Signed)
 Emergency Medicine Observation Re-evaluation Note  Maria Fox is a 78 y.o. female, seen on rounds today.  Pt initially presented to the ED for complaints of Suicidal and Diarrhea Currently, the patient is calm and cooperative.  Physical Exam  BP 122/68 (BP Location: Right Arm)   Pulse 79   Temp 97.6 F (36.4 C) (Oral)   Resp 16   SpO2 100%  Physical Exam General: Awake. Alert. No acute distress Cardiac: Regular rate rhythm Lungs: Clear to auscultation bilaterally Psych: Calm and cooperative  ED Course / MDM  EKG:EKG Interpretation Date/Time:  Saturday May 31 2023 06:37:02 EDT Ventricular Rate:  68 PR Interval:  164 QRS Duration:  132 QT Interval:  410 QTC Calculation: 435 R Axis:   -59  Text Interpretation: Normal sinus rhythm Right bundle branch block unchanged Confirmed by Palumbo, April ([id number]) on 05/31/2023 6:40:09 AM  I have reviewed the labs performed to date as well as medications administered while in observation.  Recent changes in the last 24 hours include no acute issues.  Plan  Current plan is for continued boarding in the ED awaiting TOC placement.    Royanne Foots, DO 06/03/23 [phone]

## 2023-06-03 NOTE — BH Assessment (Addendum)
 LCSW Progress Note:    [id number]  ALMINA SCHUL 06/03/2023 9:07 PM   Patient was recommended inpatient per Dahlia Byes NP. [facility], per [facility] Rona Ravens RN. Patient was referred to the following out of network facilities during this pm shift:   Destination   Service Provider   Address Phone Fax Patient Preferred  [facility]   [address] [place], [place] [address] [phone] --  [facility]   [address] [place] [address] [phone] --  [facility]   [address] [place] [address] [phone] --  [facility] EFAX   [address] [place], [place] [phone] --  [facility]   [address] [place], [place] [address] [phone] --  [facility] Patient Placement   [facility], [place] [phone] --  [facility]   [address] [place] [address] [phone] --  [facility] [address]   [address] [place] [address] [phone] --  [facility]   [address] [place] [address] [phone] --  [facility]   [address] [place], [place] [address] [phone] --  [facility] (0)   [address] [place], [place] [address] [phone] --  [facility]   [address] [place], [place] [address] [phone] --  [facility]   [address] [place] [address] [phone] --  [facility]   [address], [place] [address] [phone] --  [facility]   [address], [place] [address] [phone] Lenice Pressman [facility]   [place] [phone] --  [facility]   [address], [place] [address] [phone] --  [facility]   [address], [place] [address] [phone] --  [facility]   [address], [place] [address] [phone] --  [facility]   [address] [place] [address] [phone] --  [facility]   [address] [place] [address] [phone] --  [facility]   [address] [place], [place] [address] [phone] --  [facility]   [address] [place] [address] [phone] --  [facility]   [address] [place] [address] [phone] --  [facility]   [place] [phone] --

## 2023-06-03 NOTE — ED Notes (Signed)
 Patient sleeping at this time, Sitter at bedside
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
## 2023-06-04 ENCOUNTER — Ambulatory Visit: Payer: Self-pay | Admitting: *Deleted

## 2023-06-04 DIAGNOSIS — F332 Major depressive disorder, recurrent severe without psychotic features: Secondary | ICD-10-CM

## 2023-06-04 NOTE — ED Notes (Signed)
 [facility] called pts significant other, [name] to inform him that pt has been psych cleared and is being transported home. Pts SO did not answer the phone, [facility] left a message regarding pt being psych cleared and returning home.   Jacquelynn Cree, [facility]  06/04/23

## 2023-06-04 NOTE — Progress Notes (Signed)
 CSW spoke with Maple Hudson, APS caseworker, who reports he spoke with DSS Placement social worker and they recommend pt being connected to [facility]. Dorene Sorrow stated it will be difficult to place patient into LTC noting that the barrier will be her recent and consistent presentation of Suicidal Ideation.

## 2023-06-04 NOTE — ED Notes (Signed)
 [facility] called [employer], social work Gaffer at [employer] to check in with her about pt, see if she can re-contact pt and assist with a LTC medicaid application. [facility] has spoken with pts significant other regarding getting in contact with pt but has missed pt due to her being in the [facility] ED. [name] will be able to assist pt with completing a LTC medicaid application but does need pt to assist with providing needed information and documentation to complete the application. [name] and [facility] agreed that pt having an ACT team will possibly improve her follow through and care coordination and will speak with pt. [facility] agreed to stay in contact with [name] to keep her updated as to when pt will be discharged in order to follow up with pt.  Jacquelynn Cree, [facility]  06/04/23
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
## 2023-06-04 NOTE — Progress Notes (Signed)
 Patient has been PSYCH cleared at this time per provider.  [place] Sabryn Preslar LCSW-A   06/04/2023 3:33 PM

## 2023-06-04 NOTE — ED Provider Notes (Signed)
 Emergency Medicine Observation Re-evaluation Note  Maria Fox is a 78 y.o. female, seen on rounds today.  Pt initially presented to the ED for complaints of Suicidal and Diarrhea Currently, the patient is awaiting inpatient psychiatric placement.  Physical Exam  BP 126/77 (BP Location: Left Arm)   Pulse 73   Temp 97.6 F (36.4 C) (Oral)   Resp 18   SpO2 98%  Physical Exam General: NAD Cardiac: RR Lungs: even unlabored Psych: na  ED Course / MDM  EKG:EKG Interpretation Date/Time:  Saturday May 31 2023 06:37:02 EDT Ventricular Rate:  68 PR Interval:  164 QRS Duration:  132 QT Interval:  410 QTC Calculation: 435 R Axis:   -59  Text Interpretation: Normal sinus rhythm Right bundle branch block unchanged Confirmed by Palumbo, April ([id number]) on 05/31/2023 6:40:09 AM  I have reviewed the labs performed to date as well as medications administered while in observation.  Recent changes in the last 24 hours include none.  Plan  Current plan is for inpatient psychiatric placement.    Alvira Monday, MD 06/04/23 [phone]
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
## 2023-06-04 NOTE — ED Notes (Signed)
 [facility] chatted Maria Fox, Maria Fox, [facility] clinical social worker care coordinator to inform her that pt is now psych cleared and will be discharged today.   Maria Fox, [facility]  06/04/22

## 2023-06-05 NOTE — Patient Outreach (Signed)
 Care Coordination   Collaboration phone call  Visit Note   06/05/2023 Name: Maria Fox MRN: [id number] DOB: 1945/05/01  Maria Fox is a 78 y.o. year old female who sees Eustaquio Boyden, MD for primary care. I  spoke with [facility] ED coordinator on 06/04/23  What matters to the patients health and wellness today?  Patient currently inpatient at [facility] ED with plan to discharge home with significant other. Options for care discussed including referral to an ACT team and coordination of long term care in a facility. Message received on 06/05/23 that patient will remain inpatient at [facility] as significant other who is patient's main caregiver has recently had spine surgery and will not be able to provide care for patient.    Goals Addressed             This Visit's Progress    care coordination       Interventions Today    Flowsheet Row Most Recent Value  Chronic Disease   Chronic disease during today's visit Other  [anxiety, depression, suicide ideation]  General Interventions   General Interventions Discussed/Reviewed Communication with  Communication with --  [collaboration phone call from [facility] coordinator [place] Coordinator-discussed plan to discharge patient home-requesting case management follow up post discharge home-considering referral to the ACT team if eligible or coordination of LTC]              SDOH assessments and interventions completed:  No     Care Coordination Interventions:  Yes, provided   Follow up plan:  pending discharge home CSW will continue to follow for needs post discharge from the hospital    Encounter Outcome:  Patient Visit Completed

## 2023-06-05 NOTE — Progress Notes (Signed)
 CSW spoke with Maria Fox, significant other. He will be picking Maria Fox up at 2:00PM - reporting that the car is fixed. CSW met with patient at bedside who expressed she is interested in a [employer] since she only has a quad cane at home. RNCM assisted with arranging DME.   The patient did mention that her significant tother, Maria Fox, has an upcoming surgery. She reports wishing she could accompany him but states she cannot. Maria Fox confirmed that the surgery is 4/24, outpatient at [facility]. He states his doctor has noted how active he is and does not anticipate [name]'s normal processes being impacted. Maria Fox reports the surgery is on his neck and at most he will have a collar for a month but states "that don't stop me!"  CSW spoke with patient at length. CSW allowed pt to to share what she is able to do at home. Patient reports she is able to get to and from the bathroom without difficulty, up and off of the toilet, she reports she bathes every other day (her preference is a pan, not the shower. She states she doesn't know what it is but she does not like the shower), she stated she has no difficulty dressing herself. She did note that Maria Fox does usually cook and prepare meals. She states her medications are packaged by the pharmacy and shipped- she takes those as she is supposed to. CSW encouraged pt to at least wash her private parts daily to avoid UTIs.   Patient reports she does not check her blood sugar at home but follows up with her PCP, Dr. Patrice Fox and says he is aware of that. She states she typically sees him 6 weeks post ED visit.  CSW inquired about what's different for patient here while in the ED and she shared the difference here is that she sees people. CSW explained to pt that socialization is not a need for placement but that if she wishes to transition to placement, she should work with the [employer] SW and the Child psychotherapist at her PCP. CSW explained that patient can apply for LTC Medicaid herself or  assign an authorized representative. She verbalized understanding.   [facility], Psych NP, EDP, ED Director, Medical Director made aware of the above information via secure chat. CSW notified TOC supervisor as well. No additional TOC needs.

## 2023-06-05 NOTE — Care Management (Addendum)
 Received message from [facility] ED SW who reports patient is requesting a rolling walker. Patient does not have a preference of DME vendor. Prior to admission patient has DME: cane.  This RNCM notified MD of need for DME: rolling walker, order has been placed. Notified Jermaine with [employer] who reports he will follow and deliver to bedside. Awaiting RW to be delivered to bedside.   TOC following for needs.  - 4:41pm This RNCM spoke with Jermaine with [employer] who reports rolling walker has been delivered.   No additional TOC needs

## 2023-06-05 NOTE — Discharge Instructions (Addendum)
 You were seen in the emergency department for depression and suicidal ideation You were evaluated by our psychiatric team and cleared for discharge home Please use a walker when getting around to prevent falls Return to the emergency department if you have thoughts of harming yourself or others Follow-up with mental health specialist at [facility] Also follow-up with your primary care doctor 1 week for reevaluation

## 2023-06-05 NOTE — ED Notes (Signed)
 [facility] attempted to contact the pts significant other several times to confirm his recent surgery and his inability to provide care to pt if she returns home. Pts SO did not answer. [facility] will try again.   Jacquelynn Cree, [facility]  06/05/23

## 2023-06-05 NOTE — ED Provider Notes (Addendum)
  Physical Exam  BP 115/64 (BP Location: Right Arm)   Pulse 74   Temp (!) 97.4 F (36.3 C) (Oral)   Resp 18   SpO2 97%   Physical Exam  Procedures  Procedures  ED Course / MDM    Medical Decision Making Amount and/or Complexity of Data Reviewed Labs: ordered.  Risk OTC drugs. Prescription drug management.   Patient been seen by psychiatry and cleared for discharge.  Reportedly cleared yesterday but sounds that they were not able to contact significant other.       Benjiman Core, MD 06/05/23 [phone]  Patient now states there is no one at home to take care of her.  States she needs to go to assisted living.  Reviewed previous PT notes that she may need some assistance at home.  Will have TOC evaluate.  TOC is seeing patient.  Not a candidate for home health or skilled nursing.  She had reviewed PT note.  Patient will be discharged.  Rolling walker written for.  Still hopeful for discharge today.  Family ember coming to pick up.     Benjiman Core, MD 06/05/23 1100    Benjiman Core, MD 06/05/23 1451

## 2023-06-05 NOTE — ED Notes (Signed)
 [facility] contacted [name] Land, pts social Administrator, Civil Service to inform her that did not return home yesterday. Per pt her significant other recently had spine surgery and is not able to care for her at home. This information was previously unreported. Pts SO does the meal preparation, cleaning of the house and assists the pt.  Jacquelynn Cree, [facility]  06/05/23

## 2023-06-05 NOTE — Progress Notes (Signed)
 Physical Therapy Treatment Patient Details Name: Maria Fox MRN: [id number] DOB: 05/24/1945 Today's Date: 06/05/2023   History of Present Illness Pt admitted from home 2* SI and diarrhea. Past medical history of  hyperlipidemia, atrial flutter, primary open angle glaucoma, chronic kidney disease stage 3, osteopenia, peripheral neuropathy, DM2, GERD, CAD, CHF, and HTN.    PT Comments  Pt A x O x 2 pleasant and willing.  Assisted OOB to amb to and from bathroom only this session as Pt c/o feeling "bad".  Pt reported mild dizziness with activity as well as a "constant headache".  Reported to RN.   Assisted Pt back to bed. TOC working on D/C.     If plan is discharge home, recommend the following: A little help with walking and/or transfers;A little help with bathing/dressing/bathroom;Assistance with cooking/housework;Assist for transportation;Help with stairs or ramp for entrance   Can travel by private vehicle        Equipment Recommendations  None recommended by PT    Recommendations for Other Services       Precautions / Restrictions Precautions Precautions: Fall Precaution/Restrictions Comments: 1:1 suicide precautions. Restrictions Weight Bearing Restrictions Per Provider Order: No     Mobility  Bed Mobility Overal bed mobility: Modified Independent             General bed mobility comments: No physical assist, self able    Transfers Overall transfer level: Needs assistance Equipment used: Rolling walker (2 wheels) Transfers: Sit to/from Stand Sit to Stand: Contact guard assist, Supervision           General transfer comment: Steady assist only.  Also asissted with a toilet transfer.    Ambulation/Gait Ambulation/Gait assistance: Contact guard assist Gait Distance (Feet): 18 Feet (9 feet x 2) Assistive device: Rolling walker (2 wheels), None Gait Pattern/deviations: Step-through pattern, Decreased step length - right, Decreased step length - left,  Shuffle, Trunk flexed       General Gait Details: limited distance to and from bathroom bathroom only as Pt c/o "not feeling well" and a headache.  Reported to RN.   Stairs             Wheelchair Mobility     Tilt Bed    Modified Rankin (Stroke Patients Only)       Balance                                            Communication Communication Communication: No apparent difficulties  Cognition Arousal: Alert Behavior During Therapy: WFL for tasks assessed/performed   PT - Cognitive impairments: No apparent impairments                       PT - Cognition Comments: AxO x 2 pleasant and willing but c/o not feeling well today.  HA "all over" and dizzy with activity. Following commands: Intact      Cueing Cueing Techniques: Verbal cues  Exercises      General Comments        Pertinent Vitals/Pain Pain Assessment Pain Assessment: Faces Faces Pain Scale: Hurts little more Pain Location: HA Pain Descriptors / Indicators: Aching, Constant Pain Intervention(s): Monitored during session, Patient requesting pain meds-RN notified    Home Living  Prior Function            PT Goals (current goals can now be found in the care plan section) Progress towards PT goals: Progressing toward goals    Frequency    Min 3X/week      PT Plan      Co-evaluation              AM-PAC PT "6 Clicks" Mobility   Outcome Measure  Help needed turning from your back to your side while in a flat bed without using bedrails?: None Help needed moving from lying on your back to sitting on the side of a flat bed without using bedrails?: None Help needed moving to and from a bed to a chair (including a wheelchair)?: None Help needed standing up from a chair using your arms (e.g., wheelchair or bedside chair)?: None Help needed to walk in hospital room?: A Little Help needed climbing 3-5 steps with a railing?  : A Little 6 Click Score: 22    End of Session Equipment Utilized During Treatment: Gait belt Activity Tolerance: Patient tolerated treatment well Patient left: in bed;with call bell/phone within reach Nurse Communication: Mobility status PT Visit Diagnosis: Difficulty in walking, not elsewhere classified (R26.2)     Time: 1206-1220 PT Time Calculation (min) (ACUTE ONLY): 14 min  Charges:    $Gait Training: 8-22 mins PT General Charges $$ ACUTE PT VISIT: 1 Visit                     Felecia Shelling  PTA Acute  Rehabilitation Services Office M-F          [phone]

## 2023-06-06 ENCOUNTER — Encounter: Payer: Self-pay | Admitting: *Deleted

## 2023-06-06 ENCOUNTER — Encounter

## 2023-06-06 NOTE — Progress Notes (Signed)
 CSW spoke with [name] who reports he picked the car up yesterday and says the battery is bad. He reports he is going to [facility] now and will pick the pt up around 10:30AM this morning. He says he just spoke with the pt and provided her this update. CSW notified RN via secure chat.

## 2023-06-06 NOTE — ED Notes (Signed)
 Attempted to call pt significant other teddy in regards to him picking pt up for discharge, voicemail left with call back number

## 2023-06-06 NOTE — ED Notes (Signed)
 [facility] completed a referral to [facility] for pt to receive ACTT services. Pts social worker/care coordinator [employer] will be following up with pt to ensure pt engages with services.  Jacquelynn Cree, [facility]  06/06/23

## 2023-06-06 NOTE — Patient Outreach (Addendum)
 Care Coordination   Collaboration   Visit Note   06/06/2023 Name: Maria Fox MRN: [id number] DOB: [birth year]  Maria Fox is a 78 y.o. year old female who sees Eustaquio Boyden, MD for primary care. I  spoke with Jacquelynn Cree -coordinator at [facility]  What matters to the patients health and wellness today?  Ongoing care coordination for mental health follow up. Pt referred to [facility] ACT - Abby Wilber Bihari [email or web address] [phone]    Goals Addressed             This Visit's Progress    care coordination       Interventions Today    Flowsheet Row Most Recent Value  General Interventions   General Interventions Discussed/Reviewed Communication with  Communication with --  [collaboration phone call from coordinator at [facility] confirmed that patient will discharge home to significant other, confirmed referral to the ACT Team with [facility]-CSW will follow up to reinforce need for close follow up]              SDOH assessments and interventions completed:  No     Care Coordination Interventions:  Yes, provided   Follow up plan: Follow up call scheduled for 06/12/23    Encounter Outcome:  Patient Visit Completed

## 2023-06-06 NOTE — ED Provider Notes (Addendum)
 Emergency Medicine Observation Re-evaluation Note  Maria Fox is a 78 y.o. female, seen on rounds today.  Pt initially presented to the ED for complaints of Suicidal and Diarrhea Currently, the patient is calm and cooperative.  Physical Exam  BP 117/64 (BP Location: Left Arm)   Pulse 84   Temp 97.6 F (36.4 C) (Oral)   Resp 18   SpO2 96%  Physical Exam General: Awake. Alert. No acute distress Cardiac: Regular rate rhythm Lungs: Clear to auscultation bilaterally Psych: Calm and cooperative  ED Course / MDM  EKG:EKG Interpretation Date/Time:  Saturday May 31 2023 06:37:02 EDT Ventricular Rate:  68 PR Interval:  164 QRS Duration:  132 QT Interval:  410 QTC Calculation: 435 R Axis:   -59  Text Interpretation: Normal sinus rhythm Right bundle branch block unchanged Confirmed by Palumbo, April ([id number]) on 05/31/2023 6:40:09 AM  I have reviewed the labs performed to date as well as medications administered while in observation.  Recent changes in the last 24 hours include patient's boyfriend is reportedly coming to get her this morning with plan for discharge.  Plan  Current plan is for continued boarding in the ED awaiting for safe discharge plan.  1518.Patient has been cleared by our TTS team.  She is appropriate for outpatient behavioral health follow-up.  She will be discharged under the care of her boyfriend who is here in the ED to pick her up.  She does have a walker to go home with and understands the importance of using this to prevent falls    Royanne Foots, DO 06/06/23 1107    Royanne Foots, DO 06/06/23 1519

## 2023-06-06 NOTE — Progress Notes (Signed)
 CSW spoke with [name] who reports he is putting the battery in the car and will be here "3 or before." CSW offered for him to return home and we send pt via safe transport and he reported he will come pick her up due to needing to go to [facility]. RN and Consulting civil engineer notified via secure chat.

## 2023-06-06 NOTE — ED Notes (Signed)
 Call from Ms. Stavros significant other who states he will not be able to pick her up tonight d/t car trouble but will come tomorrow to get her.

## 2023-06-09 ENCOUNTER — Telehealth: Payer: Self-pay | Admitting: *Deleted

## 2023-06-09 NOTE — Patient Outreach (Signed)
 Phone call to patient to follow up on recent hospitalization. Patient's significant other requested a call at a later time as they were on their way out. Appointment scheduled for 06/10/23 at 3:30pm.  Verna Czech, LCSW [place]  [facility], [facility] Licensed Clinical Social Worker Care Coordinator  Direct Dial: [phone]

## 2023-06-10 ENCOUNTER — Ambulatory Visit: Payer: Self-pay | Admitting: *Deleted

## 2023-06-10 ENCOUNTER — Telehealth: Payer: Self-pay

## 2023-06-10 NOTE — Patient Outreach (Signed)
 Care Coordination   06/10/2023 Name: Maria Fox MRN: [id number] DOB: 1946/02/19   Care Coordination Outreach Attempts:  An unsuccessful telephone outreach was attempted today to offer the patient information about available complex care management services.  Follow Up Plan:  Additional outreach attempts will be made to offer the patient complex care management information and services.   Encounter Outcome:  No Answer   Care Coordination Interventions:  No, not indicated    Eduard Penkala, LCSW [place]  [facility], [facility] Licensed Clinical Social Worker Care Coordinator  Direct Dial: [phone]

## 2023-06-11 ENCOUNTER — Encounter (HOSPITAL_COMMUNITY): Payer: Self-pay | Admitting: Emergency Medicine

## 2023-06-11 ENCOUNTER — Other Ambulatory Visit: Payer: Self-pay

## 2023-06-11 ENCOUNTER — Observation Stay (HOSPITAL_COMMUNITY)
Admission: EM | Admit: 2023-06-11 | Discharge: 2023-06-24 | Disposition: A | Discharge status: Home / Self Care | Attending: Internal Medicine | Admitting: Internal Medicine

## 2023-06-11 ENCOUNTER — Emergency Department (HOSPITAL_COMMUNITY)

## 2023-06-11 DIAGNOSIS — N179 Acute kidney failure, unspecified: Secondary | ICD-10-CM | POA: Diagnosis not present

## 2023-06-11 DIAGNOSIS — Z1152 Encounter for screening for COVID-19: Secondary | ICD-10-CM | POA: Insufficient documentation

## 2023-06-11 DIAGNOSIS — E782 Mixed hyperlipidemia: Secondary | ICD-10-CM | POA: Insufficient documentation

## 2023-06-11 DIAGNOSIS — K219 Gastro-esophageal reflux disease without esophagitis: Secondary | ICD-10-CM | POA: Diagnosis not present

## 2023-06-11 DIAGNOSIS — N3000 Acute cystitis without hematuria: Secondary | ICD-10-CM | POA: Diagnosis present

## 2023-06-11 DIAGNOSIS — I5030 Unspecified diastolic (congestive) heart failure: Secondary | ICD-10-CM | POA: Diagnosis not present

## 2023-06-11 DIAGNOSIS — R109 Unspecified abdominal pain: Secondary | ICD-10-CM | POA: Diagnosis not present

## 2023-06-11 DIAGNOSIS — I480 Paroxysmal atrial fibrillation: Secondary | ICD-10-CM | POA: Diagnosis not present

## 2023-06-11 DIAGNOSIS — R0689 Other abnormalities of breathing: Secondary | ICD-10-CM | POA: Diagnosis not present

## 2023-06-11 DIAGNOSIS — Z79899 Other long term (current) drug therapy: Secondary | ICD-10-CM | POA: Diagnosis not present

## 2023-06-11 DIAGNOSIS — E1165 Type 2 diabetes mellitus with hyperglycemia: Secondary | ICD-10-CM | POA: Diagnosis not present

## 2023-06-11 DIAGNOSIS — N1831 Chronic kidney disease, stage 3a: Secondary | ICD-10-CM | POA: Diagnosis present

## 2023-06-11 DIAGNOSIS — E1122 Type 2 diabetes mellitus with diabetic chronic kidney disease: Secondary | ICD-10-CM | POA: Diagnosis not present

## 2023-06-11 DIAGNOSIS — Z794 Long term (current) use of insulin: Secondary | ICD-10-CM | POA: Insufficient documentation

## 2023-06-11 DIAGNOSIS — N1832 Chronic kidney disease, stage 3b: Secondary | ICD-10-CM | POA: Insufficient documentation

## 2023-06-11 DIAGNOSIS — R45851 Suicidal ideations: Principal | ICD-10-CM

## 2023-06-11 DIAGNOSIS — K5730 Diverticulosis of large intestine without perforation or abscess without bleeding: Secondary | ICD-10-CM | POA: Diagnosis not present

## 2023-06-11 DIAGNOSIS — I69911 Memory deficit following unspecified cerebrovascular disease: Secondary | ICD-10-CM | POA: Insufficient documentation

## 2023-06-11 DIAGNOSIS — K5900 Constipation, unspecified: Secondary | ICD-10-CM | POA: Diagnosis not present

## 2023-06-11 DIAGNOSIS — I10 Essential (primary) hypertension: Secondary | ICD-10-CM | POA: Diagnosis present

## 2023-06-11 DIAGNOSIS — I130 Hypertensive heart and chronic kidney disease with heart failure and stage 1 through stage 4 chronic kidney disease, or unspecified chronic kidney disease: Secondary | ICD-10-CM | POA: Insufficient documentation

## 2023-06-11 DIAGNOSIS — R1030 Lower abdominal pain, unspecified: Secondary | ICD-10-CM | POA: Diagnosis present

## 2023-06-11 DIAGNOSIS — E876 Hypokalemia: Secondary | ICD-10-CM | POA: Diagnosis present

## 2023-06-11 DIAGNOSIS — Z951 Presence of aortocoronary bypass graft: Secondary | ICD-10-CM | POA: Insufficient documentation

## 2023-06-11 DIAGNOSIS — F329 Major depressive disorder, single episode, unspecified: Secondary | ICD-10-CM | POA: Insufficient documentation

## 2023-06-11 DIAGNOSIS — I499 Cardiac arrhythmia, unspecified: Secondary | ICD-10-CM | POA: Diagnosis not present

## 2023-06-11 DIAGNOSIS — E1169 Type 2 diabetes mellitus with other specified complication: Secondary | ICD-10-CM | POA: Diagnosis present

## 2023-06-11 DIAGNOSIS — I2510 Atherosclerotic heart disease of native coronary artery without angina pectoris: Secondary | ICD-10-CM | POA: Diagnosis not present

## 2023-06-11 DIAGNOSIS — R413 Other amnesia: Secondary | ICD-10-CM | POA: Diagnosis present

## 2023-06-11 DIAGNOSIS — E113293 Type 2 diabetes mellitus with mild nonproliferative diabetic retinopathy without macular edema, bilateral: Secondary | ICD-10-CM | POA: Diagnosis present

## 2023-06-11 DIAGNOSIS — R739 Hyperglycemia, unspecified: Secondary | ICD-10-CM | POA: Diagnosis not present

## 2023-06-11 DIAGNOSIS — R197 Diarrhea, unspecified: Secondary | ICD-10-CM | POA: Diagnosis not present

## 2023-06-11 LAB — COMPREHENSIVE METABOLIC PANEL WITH GFR
ALT: 13 U/L (ref 0–44)
AST: 20 U/L (ref 15–41)
Albumin: 4.5 g/dL (ref 3.5–5.0)
Alkaline Phosphatase: 91 U/L (ref 38–126)
Anion gap: 16 — ABNORMAL HIGH (ref 5–15)
BUN: 28 mg/dL — ABNORMAL HIGH (ref 8–23)
CO2: 16 mmol/L — ABNORMAL LOW (ref 22–32)
Calcium: 9.9 mg/dL (ref 8.9–10.3)
Chloride: 103 mmol/L (ref 98–111)
Creatinine, Ser: 1.82 mg/dL — ABNORMAL HIGH (ref 0.44–1.00)
GFR, Estimated: 28 mL/min — ABNORMAL LOW (ref >60)
Glucose, Bld: 339 mg/dL — ABNORMAL HIGH (ref 70–99)
Potassium: 4.4 mmol/L (ref 3.5–5.1)
Sodium: 135 mmol/L (ref 135–145)
Total Bilirubin: 1.4 mg/dL — ABNORMAL HIGH (ref 0.0–1.2)
Total Protein: 9.1 g/dL — ABNORMAL HIGH (ref 6.5–8.1)

## 2023-06-11 LAB — CBC WITH DIFFERENTIAL/PLATELET
Abs Immature Granulocytes: 0.04 10*3/uL (ref 0.00–0.07)
Basophils Absolute: 0.1 10*3/uL (ref 0.0–0.1)
Basophils Relative: 1 %
Eosinophils Absolute: 0.2 10*3/uL (ref 0.0–0.5)
Eosinophils Relative: 2 %
HCT: 41.2 % (ref 36.0–46.0)
Hemoglobin: 13.7 g/dL (ref 12.0–15.0)
Immature Granulocytes: 0 %
Lymphocytes Relative: 14 %
Lymphs Abs: 1.3 10*3/uL (ref 0.7–4.0)
MCH: 29 pg (ref 26.0–34.0)
MCHC: 33.3 g/dL (ref 30.0–36.0)
MCV: 87.1 fL (ref 80.0–100.0)
Monocytes Absolute: 0.4 10*3/uL (ref 0.1–1.0)
Monocytes Relative: 5 %
Neutro Abs: 7.5 10*3/uL (ref 1.7–7.7)
Neutrophils Relative %: 78 %
Platelets: 265 10*3/uL (ref 150–400)
RBC: 4.73 MIL/uL (ref 3.87–5.11)
RDW: 14.1 % (ref 11.5–15.5)
WBC: 9.6 10*3/uL (ref 4.0–10.5)
nRBC: 0 % (ref 0.0–0.2)

## 2023-06-11 LAB — LIPASE, BLOOD: Lipase: 46 U/L (ref 11–51)

## 2023-06-11 LAB — RESP PANEL BY RT-PCR (RSV, FLU A&B, COVID)  RVPGX2
Influenza A by PCR: NEGATIVE
Influenza B by PCR: NEGATIVE
Resp Syncytial Virus by PCR: NEGATIVE
SARS Coronavirus 2 by RT PCR: NEGATIVE

## 2023-06-11 LAB — I-STAT CG4 LACTIC ACID, ED
Lactic Acid, Venous: 1.6 mmol/L (ref 0.5–1.9)
Lactic Acid, Venous: 1.6 mmol/L (ref 0.5–1.9)

## 2023-06-11 MED ORDER — SODIUM CHLORIDE 0.9 % IV BOLUS
1000.0000 mL | Freq: Once | INTRAVENOUS | Status: AC
  Administered 2023-06-11: 1000 mL via INTRAVENOUS

## 2023-06-11 MED ORDER — ONDANSETRON HCL 4 MG/2ML IJ SOLN
4.0000 mg | Freq: Once | INTRAMUSCULAR | Status: AC
  Administered 2023-06-11: 4 mg via INTRAVENOUS
  Filled 2023-06-11: qty 2

## 2023-06-11 MED ORDER — SODIUM CHLORIDE 0.9 % IV SOLN: Freq: Once | INTRAVENOUS | Status: AC

## 2023-06-11 NOTE — ED Triage Notes (Signed)
 Pt arriving via [facility] from home for LLQ abd pain & diarrhea x4 weeks. Noncompliant with her medications. On blood thinners. Increased work of breathing.

## 2023-06-11 NOTE — ED Provider Notes (Signed)
 [place] EMERGENCY DEPARTMENT AT [facility] Provider Note   CSN: [id number] Arrival date & time: 06/11/23  8295     History  Chief Complaint  Patient presents with   Abdominal Pain    Maria Fox is a 78 y.o. female.  78 year old female with prior medical history detailed below presents for evaluation.  Patient reports gradually worsening loose diarrheal stool x 1 week.  She reports multiple episodes of diarrheal stool daily.  She reports no fever.  She denies vomiting.  She reports some mild nausea.  She is incontinent of stool on exam  The history is provided by the patient.       Home Medications Prior to Admission medications   Medication Sig Start Date End Date Taking? Authorizing Provider  acetaminophen (TYLENOL) 650 MG CR tablet Take 1,300 mg by mouth every 6 (six) hours as needed for pain.    [provider]  albuterol (PROVENTIL) (2.5 MG/3ML) 0.083% nebulizer solution Inhale 3 mLs into the lungs every 6 (six) hours as needed for wheezing or shortness of breath. 03/26/23   Verner Chol, MD  albuterol (VENTOLIN HFA) 108 (90 Base) MCG/ACT inhaler Inhale 1-2 puffs into the lungs every 6 (six) hours as needed for wheezing or shortness of breath. 05/15/23   Eustaquio Boyden, MD  apixaban (ELIQUIS) 5 MG TABS tablet Take 1 tablet (5 mg total) by mouth 2 (two) times daily. 05/15/23   Eustaquio Boyden, MD  atorvastatin (LIPITOR) 40 MG tablet TAKE 1 TABLET BY MOUTH EVERY EVENING 06/04/23   Eustaquio Boyden, MD  cephALEXin (KEFLEX) 500 MG capsule Take 1 capsule (500 mg total) by mouth 2 (two) times daily. Patient not taking: Reported on 05/16/2023 05/15/23   Eustaquio Boyden, MD  clopidogrel (PLAVIX) 75 MG tablet Take 1 tablet (75 mg total) by mouth daily. 05/15/23   Eustaquio Boyden, MD  dorzolamide-timolol (COSOPT) 2-0.5 % ophthalmic solution Place 1 drop into both eyes 2 (two) times daily. 04/24/23   [provider]  DULoxetine (CYMBALTA) 60 MG capsule  Take 1 capsule (60 mg total) by mouth daily. 05/15/23   Eustaquio Boyden, MD  feeding supplement, GLUCERNA SHAKE, (GLUCERNA SHAKE) LIQD Take 237 mLs by mouth 3 (three) times daily between meals. Patient taking differently: Take 237 mLs by mouth in the morning and at bedtime. 03/26/23   Verner Chol, MD  gabapentin (NEURONTIN) 300 MG capsule Take 1 capsule (300 mg total) by mouth 2 (two) times daily. Patient taking differently: Take 300 mg by mouth 2 (two) times daily as needed (Pain, muscle spasms). 05/15/23   Eustaquio Boyden, MD  glipiZIDE (GLUCOTROL) 10 MG tablet Take 1 tablet (10 mg total) by mouth 2 (two) times daily. Always take with food 05/15/23   Eustaquio Boyden, MD  hydrocortisone cream 1 % Apply topically 3 (three) times daily. Patient taking differently: Apply 1 Application topically 2 (two) times daily as needed for itching. 03/26/23   Verner Chol, MD  isosorbide mononitrate (IMDUR) 30 MG 24 hr tablet Take 0.5 tablets (15 mg total) by mouth daily. 05/15/23   Eustaquio Boyden, MD  latanoprost (XALATAN) 0.005 % ophthalmic solution Place 1 drop into both eyes at bedtime. 03/26/23   Verner Chol, MD  linagliptin (TRADJENTA) 5 MG TABS tablet Take 1 tablet (5 mg total) by mouth daily. Patient not taking: Reported on 05/16/2023 05/15/23   Eustaquio Boyden, MD  melatonin 5 MG TABS Take 5 mg by mouth at bedtime as needed (sleep).    [provider]  metFORMIN (GLUCOPHAGE-XR) 500 MG 24 hr tablet Take 1 tablet (500 mg total) by mouth daily with breakfast. 05/15/23   Eustaquio Boyden, MD  metoprolol succinate (TOPROL-XL) 25 MG 24 hr tablet Take 1 tablet (25 mg total) by mouth daily. 05/15/23   Eustaquio Boyden, MD  nitroGLYCERIN (NITROSTAT) 0.4 MG SL tablet Place 0.4 mg under the tongue every 5 (five) minutes as needed for chest pain. 04/04/23   [provider]  pantoprazole (PROTONIX) 40 MG tablet Take 1 tablet (40 mg total) by mouth daily. Patient taking differently: Take 40 mg by  mouth 2 (two) times daily. 05/15/23   Eustaquio Boyden, MD  phenazopyridine (PYRIDIUM) 100 MG tablet Take 100 mg by mouth daily as needed for pain.    [provider]  QUEtiapine (SEROQUEL) 50 MG tablet Take 50 mg by mouth at bedtime.    [provider]  risperiDONE (RISPERDAL) 1 MG tablet Take 1 tablet (1 mg total) by mouth in the morning. With 2mg  at night Patient not taking: Reported on 05/16/2023 05/15/23   Eustaquio Boyden, MD  risperiDONE (RISPERDAL) 2 MG tablet Take 1 tablet (2 mg total) by mouth at bedtime. With 1mg  in am Patient not taking: Reported on 05/16/2023 05/15/23   Eustaquio Boyden, MD  Saccharomyces boulardii (PROBIOTIC) 250 MG CAPS Take 250 mg by mouth daily.    [provider]  traZODone (DESYREL) 50 MG tablet Take 50 mg by mouth at bedtime.    [provider]      Allergies    Bee venom, Mushroom extract complex (obsolete), Penicillins, Codeine, Sulfa antibiotics, and Iohexol    Review of Systems   Review of Systems  All other systems reviewed and are negative.   Physical Exam Updated Vital Signs BP (!) 150/85   Pulse (!) 109   Resp (!) 35   SpO2 100%  Physical Exam Vitals and nursing note reviewed.  Constitutional:      General: She is not in acute distress.    Appearance: Normal appearance. She is well-developed.     Comments: Alert, incontinent of stool  HENT:     Head: Normocephalic and atraumatic.  Eyes:     Conjunctiva/sclera: Conjunctivae normal.     Pupils: Pupils are equal, round, and reactive to light.  Cardiovascular:     Rate and Rhythm: Regular rhythm. Tachycardia present.     Heart sounds: Normal heart sounds.  Pulmonary:     Effort: Pulmonary effort is normal. No respiratory distress.     Breath sounds: Normal breath sounds.  Abdominal:     General: There is no distension.     Palpations: Abdomen is soft.     Tenderness: There is no abdominal tenderness.  Musculoskeletal:        General: No deformity.  Normal range of motion.     Cervical back: Normal range of motion and neck supple.  Skin:    General: Skin is warm and dry.  Neurological:     General: No focal deficit present.     Mental Status: She is alert and oriented to person, place, and time.     ED Results / Procedures / Treatments   Labs (all labs ordered are listed, but only abnormal results are displayed) Labs Reviewed  C DIFFICILE QUICK SCREEN W PCR REFLEX    CULTURE, BLOOD (ROUTINE X 2)  CULTURE, BLOOD (ROUTINE X 2)  RESP PANEL BY RT-PCR (RSV, FLU A&B, COVID)  RVPGX2  CBC WITH DIFFERENTIAL/PLATELET  COMPREHENSIVE METABOLIC PANEL WITH  GFR  LIPASE, BLOOD  URINALYSIS, W/ REFLEX TO CULTURE (INFECTION SUSPECTED)  I-STAT CG4 LACTIC ACID, ED    EKG None  Radiology No results found.  Procedures Procedures    Medications Ordered in ED Medications  sodium chloride 0.9 % bolus 1,000 mL (has no administration in time range)    ED Course/ Medical Decision Making/ A&P                                 Medical Decision Making Amount and/or Complexity of Data Reviewed Labs: ordered. Radiology: ordered.  Risk Prescription drug management. Decision regarding hospitalization.    Medical Screen Complete  This patient presented to the ED with complaint of diarrhea .  This complaint involves an extensive number of treatment options. The initial differential diagnosis includes, but is not limited to, metabolic abnormality, etc  This presentation is: Acute, Chronic, Self-Limited, Previously Undiagnosed, Uncertain Prognosis, Complicated, Systemic Symptoms, and Threat to Life/Bodily Function  Patient presents with complaint of profound continuous loose watery diarrheal stool.  Patient appears to be dehydrated on exam.    Labs are concerning given elevated creatinine at 1.8 and BUN of 28.  Patient may have compliance issues as well based on history.  Patient would benefit from admission for further workup and  treatment.  Hospitalist service aware of case will evaluate for admission.    Co morbidities that complicated the patient's evaluation  See HPI   Additional history obtained: External records from outside sources obtained and reviewed including prior ED visits and prior Inpatient records.    Problem List / ED Course:  Diarrhea, AKI  Disposition:  After consideration of the diagnostic results and the patients response to treatment, I feel that the patent would benefit from admission.          Final Clinical Impression(s) / ED Diagnoses Final diagnoses:  Diarrhea, unspecified type  AKI (acute kidney injury) [facility])    Rx / DC Orders ED Discharge Orders     None         Wynetta Fines, MD 06/11/23 2159

## 2023-06-12 ENCOUNTER — Encounter: Admitting: *Deleted

## 2023-06-12 ENCOUNTER — Encounter (HOSPITAL_COMMUNITY): Payer: Self-pay | Admitting: Internal Medicine

## 2023-06-12 DIAGNOSIS — K5900 Constipation, unspecified: Secondary | ICD-10-CM | POA: Diagnosis not present

## 2023-06-12 DIAGNOSIS — I5030 Unspecified diastolic (congestive) heart failure: Secondary | ICD-10-CM | POA: Diagnosis not present

## 2023-06-12 DIAGNOSIS — Z951 Presence of aortocoronary bypass graft: Secondary | ICD-10-CM | POA: Diagnosis not present

## 2023-06-12 DIAGNOSIS — R197 Diarrhea, unspecified: Secondary | ICD-10-CM | POA: Diagnosis not present

## 2023-06-12 DIAGNOSIS — E782 Mixed hyperlipidemia: Secondary | ICD-10-CM

## 2023-06-12 DIAGNOSIS — N179 Acute kidney failure, unspecified: Secondary | ICD-10-CM | POA: Diagnosis not present

## 2023-06-12 DIAGNOSIS — E1122 Type 2 diabetes mellitus with diabetic chronic kidney disease: Secondary | ICD-10-CM | POA: Insufficient documentation

## 2023-06-12 DIAGNOSIS — E113293 Type 2 diabetes mellitus with mild nonproliferative diabetic retinopathy without macular edema, bilateral: Secondary | ICD-10-CM

## 2023-06-12 DIAGNOSIS — I480 Paroxysmal atrial fibrillation: Secondary | ICD-10-CM | POA: Diagnosis not present

## 2023-06-12 DIAGNOSIS — I10 Essential (primary) hypertension: Secondary | ICD-10-CM

## 2023-06-12 DIAGNOSIS — E1169 Type 2 diabetes mellitus with other specified complication: Secondary | ICD-10-CM

## 2023-06-12 DIAGNOSIS — I2510 Atherosclerotic heart disease of native coronary artery without angina pectoris: Secondary | ICD-10-CM | POA: Diagnosis not present

## 2023-06-12 DIAGNOSIS — Z79899 Other long term (current) drug therapy: Secondary | ICD-10-CM | POA: Diagnosis not present

## 2023-06-12 DIAGNOSIS — E876 Hypokalemia: Secondary | ICD-10-CM | POA: Diagnosis not present

## 2023-06-12 DIAGNOSIS — N189 Chronic kidney disease, unspecified: Secondary | ICD-10-CM | POA: Diagnosis not present

## 2023-06-12 DIAGNOSIS — K219 Gastro-esophageal reflux disease without esophagitis: Secondary | ICD-10-CM | POA: Diagnosis not present

## 2023-06-12 DIAGNOSIS — N1831 Chronic kidney disease, stage 3a: Secondary | ICD-10-CM

## 2023-06-12 DIAGNOSIS — I130 Hypertensive heart and chronic kidney disease with heart failure and stage 1 through stage 4 chronic kidney disease, or unspecified chronic kidney disease: Secondary | ICD-10-CM | POA: Diagnosis not present

## 2023-06-12 DIAGNOSIS — Z1152 Encounter for screening for COVID-19: Secondary | ICD-10-CM | POA: Diagnosis not present

## 2023-06-12 DIAGNOSIS — N3000 Acute cystitis without hematuria: Secondary | ICD-10-CM | POA: Diagnosis not present

## 2023-06-12 DIAGNOSIS — E1165 Type 2 diabetes mellitus with hyperglycemia: Secondary | ICD-10-CM | POA: Diagnosis not present

## 2023-06-12 DIAGNOSIS — N1832 Chronic kidney disease, stage 3b: Secondary | ICD-10-CM | POA: Diagnosis not present

## 2023-06-12 DIAGNOSIS — Z794 Long term (current) use of insulin: Secondary | ICD-10-CM | POA: Diagnosis not present

## 2023-06-12 DIAGNOSIS — I69911 Memory deficit following unspecified cerebrovascular disease: Secondary | ICD-10-CM | POA: Diagnosis not present

## 2023-06-12 LAB — COMPREHENSIVE METABOLIC PANEL WITH GFR
ALT: 13 U/L (ref 0–44)
AST: 23 U/L (ref 15–41)
Albumin: 3.3 g/dL — ABNORMAL LOW (ref 3.5–5.0)
Alkaline Phosphatase: 63 U/L (ref 38–126)
Anion gap: 6 (ref 5–15)
BUN: 23 mg/dL (ref 8–23)
CO2: 19 mmol/L — ABNORMAL LOW (ref 22–32)
Calcium: 8 mg/dL — ABNORMAL LOW (ref 8.9–10.3)
Chloride: 108 mmol/L (ref 98–111)
Creatinine, Ser: 1.43 mg/dL — ABNORMAL HIGH (ref 0.44–1.00)
GFR, Estimated: 37 mL/min — ABNORMAL LOW (ref >60)
Glucose, Bld: 193 mg/dL — ABNORMAL HIGH (ref 70–99)
Potassium: 3.2 mmol/L — ABNORMAL LOW (ref 3.5–5.1)
Sodium: 133 mmol/L — ABNORMAL LOW (ref 135–145)
Total Bilirubin: 0.6 mg/dL (ref 0.0–1.2)
Total Protein: 6.3 g/dL — ABNORMAL LOW (ref 6.5–8.1)

## 2023-06-12 LAB — CBC WITH DIFFERENTIAL/PLATELET
Abs Immature Granulocytes: 0.03 10*3/uL (ref 0.00–0.07)
Basophils Absolute: 0.1 10*3/uL (ref 0.0–0.1)
Basophils Relative: 1 %
Eosinophils Absolute: 0.2 10*3/uL (ref 0.0–0.5)
Eosinophils Relative: 3 %
HCT: 31.9 % — ABNORMAL LOW (ref 36.0–46.0)
Hemoglobin: 10.6 g/dL — ABNORMAL LOW (ref 12.0–15.0)
Immature Granulocytes: 1 %
Lymphocytes Relative: 27 %
Lymphs Abs: 1.8 10*3/uL (ref 0.7–4.0)
MCH: 29.3 pg (ref 26.0–34.0)
MCHC: 33.2 g/dL (ref 30.0–36.0)
MCV: 88.1 fL (ref 80.0–100.0)
Monocytes Absolute: 0.5 10*3/uL (ref 0.1–1.0)
Monocytes Relative: 7 %
Neutro Abs: 3.9 10*3/uL (ref 1.7–7.7)
Neutrophils Relative %: 61 %
Platelets: 234 10*3/uL (ref 150–400)
RBC: 3.62 MIL/uL — ABNORMAL LOW (ref 3.87–5.11)
RDW: 13.9 % (ref 11.5–15.5)
WBC: 6.5 10*3/uL (ref 4.0–10.5)
nRBC: 0 % (ref 0.0–0.2)

## 2023-06-12 LAB — URINALYSIS, W/ REFLEX TO CULTURE (INFECTION SUSPECTED)
Bilirubin Urine: NEGATIVE
Glucose, UA: NEGATIVE mg/dL
Hgb urine dipstick: NEGATIVE
Ketones, ur: NEGATIVE mg/dL
Nitrite: NEGATIVE
Protein, ur: NEGATIVE mg/dL
Specific Gravity, Urine: 1.011 (ref 1.005–1.030)
pH: 5 (ref 5.0–8.0)

## 2023-06-12 LAB — C DIFFICILE QUICK SCREEN W PCR REFLEX
C Diff antigen: NEGATIVE
C Diff interpretation: NOT DETECTED
C Diff toxin: NEGATIVE

## 2023-06-12 LAB — GLUCOSE, CAPILLARY
Glucose-Capillary: 172 mg/dL — ABNORMAL HIGH (ref 70–99)
Glucose-Capillary: 227 mg/dL — ABNORMAL HIGH (ref 70–99)
Glucose-Capillary: 117 mg/dL — ABNORMAL HIGH (ref 70–99)
Glucose-Capillary: 177 mg/dL — ABNORMAL HIGH (ref 70–99)

## 2023-06-12 MED ORDER — ALUM & MAG HYDROXIDE-SIMETH 200-200-20 MG/5ML PO SUSP
30.0000 mL | ORAL | Status: DC | PRN
  Administered 2023-06-12 – 2023-06-19 (×3): 30 mL via ORAL
  Filled 2023-06-12 (×4): qty 30

## 2023-06-12 MED ORDER — ACETAMINOPHEN 325 MG PO TABS
650.0000 mg | ORAL_TABLET | ORAL | Status: DC | PRN
  Administered 2023-06-12 – 2023-06-24 (×22): 650 mg via ORAL
  Filled 2023-06-12 (×23): qty 2

## 2023-06-12 MED ORDER — ATORVASTATIN CALCIUM 40 MG PO TABS
40.0000 mg | ORAL_TABLET | Freq: Every evening | ORAL | Status: DC
  Administered 2023-06-12 – 2023-06-24 (×14): 40 mg via ORAL
  Filled 2023-06-12 (×12): qty 1

## 2023-06-12 MED ORDER — ACETAMINOPHEN 325 MG PO TABS
650.0000 mg | ORAL_TABLET | Freq: Once | ORAL | Status: AC
  Administered 2023-06-12: 650 mg via ORAL
  Filled 2023-06-12: qty 2

## 2023-06-12 MED ORDER — NITROGLYCERIN 0.4 MG SL SUBL
0.4000 mg | SUBLINGUAL_TABLET | SUBLINGUAL | Status: DC | PRN
Start: 2023-06-12 — End: 2023-06-24

## 2023-06-12 MED ORDER — APIXABAN 5 MG PO TABS
5.0000 mg | ORAL_TABLET | Freq: Two times a day (BID) | ORAL | Status: DC
  Administered 2023-06-12 – 2023-06-24 (×26): 5 mg via ORAL
  Filled 2023-06-12 (×26): qty 1

## 2023-06-12 MED ORDER — INSULIN ASPART 100 UNIT/ML IJ SOLN
0.0000 [IU] | Freq: Three times a day (TID) | INTRAMUSCULAR | Status: DC
  Administered 2023-06-12: 2 [IU] via SUBCUTANEOUS
  Administered 2023-06-12: 3 [IU] via SUBCUTANEOUS
  Administered 2023-06-13: 1 [IU] via SUBCUTANEOUS
  Administered 2023-06-13: 3 [IU] via SUBCUTANEOUS
  Administered 2023-06-13 – 2023-06-14 (×2): 1 [IU] via SUBCUTANEOUS
  Administered 2023-06-14 (×2): 3 [IU] via SUBCUTANEOUS

## 2023-06-12 MED ORDER — MELATONIN 5 MG PO TABS
5.0000 mg | ORAL_TABLET | Freq: Every evening | ORAL | Status: DC | PRN
  Administered 2023-06-12 – 2023-06-23 (×6): 5 mg via ORAL
  Filled 2023-06-12 (×6): qty 1

## 2023-06-12 MED ORDER — DORZOLAMIDE HCL-TIMOLOL MAL 2-0.5 % OP SOLN
1.0000 [drp] | Freq: Two times a day (BID) | OPHTHALMIC | Status: DC
  Administered 2023-06-12 – 2023-06-24 (×26): 1 [drp] via OPHTHALMIC
  Filled 2023-06-12: qty 10

## 2023-06-12 MED ORDER — PANTOPRAZOLE SODIUM 40 MG PO TBEC
40.0000 mg | DELAYED_RELEASE_TABLET | Freq: Two times a day (BID) | ORAL | Status: DC
  Administered 2023-06-12 – 2023-06-24 (×26): 40 mg via ORAL
  Filled 2023-06-12 (×26): qty 1

## 2023-06-12 MED ORDER — LATANOPROST 0.005 % OP SOLN
1.0000 [drp] | Freq: Every day | OPHTHALMIC | Status: DC
  Administered 2023-06-12 – 2023-06-23 (×13): 1 [drp] via OPHTHALMIC
  Filled 2023-06-12: qty 2.5

## 2023-06-12 MED ORDER — CARMEX CLASSIC LIP BALM EX OINT
1.0000 | TOPICAL_OINTMENT | CUTANEOUS | Status: DC | PRN
  Administered 2023-06-12: 1 via TOPICAL
  Filled 2023-06-12: qty 10

## 2023-06-12 MED ORDER — MUPIROCIN 2 % EX OINT
TOPICAL_OINTMENT | Freq: Two times a day (BID) | CUTANEOUS | Status: DC
  Administered 2023-06-13 – 2023-06-18 (×2): 1 via NASAL
  Filled 2023-06-12 (×2): qty 22

## 2023-06-12 MED ORDER — DIPHENHYDRAMINE HCL 25 MG PO CAPS
25.0000 mg | ORAL_CAPSULE | Freq: Four times a day (QID) | ORAL | Status: DC | PRN
  Administered 2023-06-12 – 2023-06-18 (×5): 25 mg via ORAL
  Filled 2023-06-12 (×5): qty 1

## 2023-06-12 MED ORDER — HYDROCORTISONE 1 % EX CREA
1.0000 | TOPICAL_CREAM | Freq: Three times a day (TID) | CUTANEOUS | Status: DC | PRN
  Administered 2023-06-12 – 2023-06-18 (×4): 1 via TOPICAL
  Filled 2023-06-12: qty 28

## 2023-06-12 MED ORDER — POTASSIUM CHLORIDE CRYS ER 20 MEQ PO TBCR
40.0000 meq | EXTENDED_RELEASE_TABLET | Freq: Once | ORAL | Status: AC
  Administered 2023-06-12: 40 meq via ORAL
  Filled 2023-06-12: qty 2

## 2023-06-12 MED ORDER — DULOXETINE HCL 30 MG PO CPEP
60.0000 mg | ORAL_CAPSULE | Freq: Every day | ORAL | Status: DC
  Administered 2023-06-12 – 2023-06-16 (×5): 60 mg via ORAL
  Filled 2023-06-12 (×5): qty 2

## 2023-06-12 MED ORDER — METOPROLOL SUCCINATE ER 25 MG PO TB24
25.0000 mg | ORAL_TABLET | Freq: Every day | ORAL | Status: DC
  Administered 2023-06-12 – 2023-06-24 (×13): 25 mg via ORAL
  Filled 2023-06-12 (×13): qty 1

## 2023-06-12 MED ORDER — CLOPIDOGREL BISULFATE 75 MG PO TABS
75.0000 mg | ORAL_TABLET | Freq: Every day | ORAL | Status: DC
  Administered 2023-06-12 – 2023-06-24 (×13): 75 mg via ORAL
  Filled 2023-06-12 (×13): qty 1

## 2023-06-12 MED ORDER — LACTATED RINGERS IV SOLN
INTRAVENOUS | Status: AC
Start: 2023-06-12 — End: 2023-06-13

## 2023-06-12 MED ORDER — ISOSORBIDE MONONITRATE ER 30 MG PO TB24
15.0000 mg | ORAL_TABLET | Freq: Every day | ORAL | Status: DC
  Administered 2023-06-12 – 2023-06-24 (×13): 15 mg via ORAL
  Filled 2023-06-12 (×13): qty 1

## 2023-06-12 NOTE — Assessment & Plan Note (Signed)
 Symptoms seemingly improving Suspect acute gastroenteritis, likely viral Continuing supportive care, advance diet as tolerated Hydrating with intravenous isotonic fluids Replacing electrolytes as necessary

## 2023-06-12 NOTE — Assessment & Plan Note (Signed)
•   Patient is currently chest pain free  Monitoring patient on telemetry  Continue home regimen of antiplatelet therapy, lipid lowering therapy and AV nodal blocking therapy

## 2023-06-12 NOTE — Plan of Care (Signed)
  Problem: Education: Goal: Knowledge of General Education information will improve Description: Including pain rating scale, medication(s)/side effects and non-pharmacologic comfort measures Outcome: Progressing   Problem: Health Behavior/Discharge Planning: Goal: Ability to manage health-related needs will improve Outcome: Progressing   Problem: Clinical Measurements: Goal: Cardiovascular complication will be avoided Outcome: Progressing   Problem: Coping: Goal: Level of anxiety will decrease Outcome: Progressing   Problem: Pain Managment: Goal: General experience of comfort will improve and/or be controlled Outcome: Progressing   Problem: Education: Goal: Ability to describe self-care measures that may prevent or decrease complications (Diabetes Survival Skills Education) will improve Outcome: Progressing   Problem: Coping: Goal: Ability to adjust to condition or change in health will improve Outcome: Progressing   Problem: Health Behavior/Discharge Planning: Goal: Ability to identify and utilize available resources and services will improve Outcome: Progressing   Problem: Nutritional: Goal: Maintenance of adequate nutrition will improve Outcome: Progressing Goal: Progress toward achieving an optimal weight will improve Outcome: Progressing   Problem: Tissue Perfusion: Goal: Adequacy of tissue perfusion will improve Outcome: Progressing
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
## 2023-06-12 NOTE — Assessment & Plan Note (Signed)
.   Continuing home regimen of lipid lowering therapy.  

## 2023-06-12 NOTE — Plan of Care (Signed)
   Problem: Education: Goal: Knowledge of General Education information will improve Description Including pain rating scale, medication(s)/side effects and non-pharmacologic comfort measures Outcome: Progressing   Problem: Health Behavior/Discharge Planning: Goal: Ability to manage health-related needs will improve Outcome: Progressing

## 2023-06-12 NOTE — Care Management Obs Status (Signed)
 MEDICARE OBSERVATION STATUS NOTIFICATION   Patient Details  Name: ANALISIA KINGSFORD MRN: [id number] Date of Birth: March 09, 1946   Medicare Observation Status Notification Given:  Yes    Otelia Santee, LCSW 06/12/2023, 1:47 PM

## 2023-06-12 NOTE — Assessment & Plan Note (Signed)
·   Replacing with potassium chloride °· Evaluating for concurrent hypomagnesemia  °· Monitoring potassium levels with serial chemistries. ° °

## 2023-06-12 NOTE — Assessment & Plan Note (Signed)
 Rate controlled and currently normal sinus rhythm Continue home regimen of Eliquis Continue home regimen of metoprolol

## 2023-06-12 NOTE — Assessment & Plan Note (Signed)
.   Patient been placed on Accu-Cheks before every meal and nightly with sliding scale insulin . Holding home regimen of hypoglycemics . Hemoglobin A1C ordered . Diabetic Diet  

## 2023-06-12 NOTE — Assessment & Plan Note (Signed)
 Patient exhibiting evidence of acute kidney injury secondary to prerenal injury due to volume depletion Creatinine is now seemingly downtrending with intravenous isotonic fluids Strict input and output monitoring Monitoring renal function and electrolytes with serial chemistries Avoiding nephrotoxic agents if at all possible

## 2023-06-12 NOTE — Assessment & Plan Note (Signed)
.   Resume patients home regimen of oral antihypertensives . Titrate antihypertensive regimen as necessary to achieve adequate BP control . PRN intravenous antihypertensives for excessively elevated blood pressure   

## 2023-06-12 NOTE — Plan of Care (Signed)
  Problem: Education: Goal: Knowledge of General Education information will improve Description: Including pain rating scale, medication(s)/side effects and non-pharmacologic comfort measures Outcome: Progressing   Problem: Clinical Measurements: Goal: Ability to maintain clinical measurements within normal limits will improve Outcome: Progressing Goal: Will remain free from infection Outcome: Progressing Goal: Diagnostic test results will improve Outcome: Progressing Goal: Respiratory complications will improve Outcome: Progressing Goal: Cardiovascular complication will be avoided Outcome: Progressing   Problem: Elimination: Goal: Will not experience complications related to urinary retention Outcome: Progressing   Problem: Pain Managment: Goal: General experience of comfort will improve and/or be controlled Outcome: Progressing   Problem: Safety: Goal: Ability to remain free from injury will improve Outcome: Progressing   Problem: Skin Integrity: Goal: Risk for impaired skin integrity will decrease Outcome: Progressing   Problem: Education: Goal: Ability to describe self-care measures that may prevent or decrease complications (Diabetes Survival Skills Education) will improve Outcome: Progressing Goal: Individualized Educational Video(s) Outcome: Progressing   Problem: Fluid Volume: Goal: Ability to maintain a balanced intake and output will improve Outcome: Progressing   Problem: Health Behavior/Discharge Planning: Goal: Ability to identify and utilize available resources and services will improve Outcome: Progressing   Problem: Nutritional: Goal: Maintenance of adequate nutrition will improve Outcome: Progressing Goal: Progress toward achieving an optimal weight will improve Outcome: Progressing   Problem: Tissue Perfusion: Goal: Adequacy of tissue perfusion will improve Outcome: Progressing   Problem: Health Behavior/Discharge Planning: Goal: Ability to  manage health-related needs will improve Outcome: Not Progressing   Problem: Activity: Goal: Risk for activity intolerance will decrease Outcome: Not Progressing   Problem: Nutrition: Goal: Adequate nutrition will be maintained Outcome: Not Progressing   Problem: Coping: Goal: Level of anxiety will decrease Outcome: Not Progressing   Problem: Elimination: Goal: Will not experience complications related to bowel motility Outcome: Not Progressing   Problem: Coping: Goal: Ability to adjust to condition or change in health will improve Outcome: Not Progressing   Problem: Health Behavior/Discharge Planning: Goal: Ability to manage health-related needs will improve Outcome: Not Progressing   Problem: Metabolic: Goal: Ability to maintain appropriate glucose levels will improve Outcome: Not Progressing   Problem: Skin Integrity: Goal: Risk for impaired skin integrity will decrease Outcome: Not Progressing

## 2023-06-12 NOTE — Hospital Course (Addendum)
 78yo with h/o CAD s/p CABG, chronic HFpEF, stage 3 CKD, DM, depression, and afib who presented on 4/2 with diarrhea and abdominal pain.  She was previously held in the ER from 3/19-28 for SI with daily psychiatry input.  CT negative but noted to have worsening renal function, resolved with IVF.  +UTI with Enterobacter, Proteus, treated with Ceftriaxone -> Cefepime.  Remains with SI, has Recruitment consultant, psych consulted.  Will dc to inpatient psych.

## 2023-06-12 NOTE — Progress Notes (Signed)
 PROGRESS NOTE   Maria Fox  WUJ:[id number] DOB: 1946/02/14 DOA: 06/11/2023 PCP: Eustaquio Boyden, MD   Date of Service: the patient was seen and examined on 06/12/2023  Brief Narrative:  78 y.o. female with history of CAD status post CABG, diastolic CHF, chronic kidney disease stage III, diabetes mellitus type 2, depression, history of DVT, history of atrial fibrillation presents to the [facility] ER after patient has been having persistent watery diarrhea and abdominal pain for the last 1 week.    Upon evaluation in the emergency department CT imaging of the abdomen pelvis revealed no obvious cause of her symptoms.  Patient did exhibit  worsening renal function with clinical evidence of dehydration and therefore the hospitalist group was called to assess the patient for admission to the hospital.  Patient was admitted to hospital service and placed on intravenous fluids.   Assessment & Plan Acute diarrhea Symptoms seemingly improving Suspect acute gastroenteritis, likely viral Continuing supportive care, advance diet as tolerated Hydrating with intravenous isotonic fluids Replacing electrolytes as necessary Acute renal failure superimposed on stage 3a chronic kidney disease ([name]) Patient exhibiting evidence of acute kidney injury secondary to prerenal injury due to volume depletion Creatinine is now seemingly downtrending with intravenous isotonic fluids Strict input and output monitoring Monitoring renal function and electrolytes with serial chemistries Avoiding nephrotoxic agents if at all possible  Hypokalemia Replacing with potassium chloride Evaluating for concurrent hypomagnesemia  Monitoring potassium levels with serial chemistries.  Paroxysmal atrial fibrillation ([name]) Rate controlled and currently normal sinus rhythm Continue home regimen of Eliquis Continue home regimen of metoprolol CAD, multiple vessel s/p CABG x 3 Patient is currently chest pain  free Monitoring patient on telemetry Continue home regimen of antiplatelet therapy, lipid lowering therapy and AV nodal blocking therapy  Essential hypertension Resume patients home regimen of oral antihypertensives Titrate antihypertensive regimen as necessary to achieve adequate BP control PRN intravenous antihypertensives for excessively elevated blood pressure  Type 2 diabetes mellitus with mild 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      Subjective:  Patient does report that her diarrhea is improving.  Patient does continue to complain of generalized weakness and generalized abdominal discomfort however.  Physical Exam:  Vitals:   06/11/23 2230 06/11/23 2326 06/12/23 0333 06/12/23 0735  BP: (!) 142/71 137/87 (!) 141/72 (!) 157/88  Pulse: 92 100 75 69  Resp: 17 18 18 17   Temp:  97.6 F (36.4 C) 97.6 F (36.4 C) (!) 97.5 F (36.4 C)  TempSrc:  Oral    SpO2: 95% 100% 100% 100%  Height:  5\' 3"  (1.6 m)       Constitutional: Awake alert and oriented x3, no associated distress.   Skin: no rashes, no lesions, poor skin turgor noted. Eyes: Pupils are equally reactive to light.  No evidence of scleral icterus or conjunctival pallor.  ENMT: Dry mucous membranes noted.  Posterior pharynx clear of any exudate or lesions.   Respiratory: clear to auscultation bilaterally, no wheezing, no crackles. Normal respiratory effort. No accessory muscle use.  Cardiovascular: Regular rate and rhythm, no murmurs / rubs / gallops. No extremity edema. 2+ pedal pulses. No carotid bruits.  Abdomen: Abdomen is soft.  Generalized tenderness.  No evidence of  intra-abdominal masses.  Positive bowel sounds noted in all quadrants.   Musculoskeletal: No joint deformity upper and lower extremities. Good ROM, no contractures. Normal muscle tone.    Data Reviewed:  I have personally reviewed and interpreted labs, imaging.  Significant findings are   CBC: Recent Labs  Lab 06/11/23 2006 06/12/23 0535  WBC 9.6 6.5  NEUTROABS 7.5 3.9  HGB 13.7 10.6*  HCT 41.2 31.9*  MCV 87.1 88.1  PLT 265 234   Basic Metabolic Panel: Recent Labs  Lab 06/11/23 2006 06/12/23 0535  NA 135 133*  K 4.4 3.2*  CL 103 108  CO2 16* 19*  GLUCOSE 339* 193*  BUN 28* 23  CREATININE 1.82* 1.43*  CALCIUM 9.9 8.0*   GFR: CrCl cannot be calculated (Unknown ideal weight.). Liver Function Tests: Recent Labs  Lab 06/11/23 2006 06/12/23 0535  AST 20 23  ALT 13 13  ALKPHOS 91 63  BILITOT 1.4* 0.6  PROT 9.1* 6.3*  ALBUMIN 4.5 3.3*    Code Status:  Full code.  Code status decision has been confirmed with: patient   Severity of Illness:  The appropriate patient status for this patient is OBSERVATION. Observation status is judged to be reasonable and necessary in order to provide the required intensity of service to ensure the patient's safety. The patient's presenting symptoms, physical exam findings, and initial radiographic and laboratory data in the context of their medical condition is felt to place them at decreased risk for further clinical deterioration. Furthermore, it is anticipated that the patient will be medically stable for discharge from the hospital within 2 midnights of admission.   Time spent:  51 minutes  Author:  Marinda Elk MD  06/12/2023 9:14 AM

## 2023-06-13 ENCOUNTER — Other Ambulatory Visit: Payer: Self-pay | Admitting: Family Medicine

## 2023-06-13 DIAGNOSIS — I10 Essential (primary) hypertension: Secondary | ICD-10-CM | POA: Diagnosis not present

## 2023-06-13 DIAGNOSIS — I2510 Atherosclerotic heart disease of native coronary artery without angina pectoris: Secondary | ICD-10-CM

## 2023-06-13 DIAGNOSIS — R45851 Suicidal ideations: Secondary | ICD-10-CM | POA: Diagnosis not present

## 2023-06-13 DIAGNOSIS — I130 Hypertensive heart and chronic kidney disease with heart failure and stage 1 through stage 4 chronic kidney disease, or unspecified chronic kidney disease: Secondary | ICD-10-CM | POA: Diagnosis not present

## 2023-06-13 DIAGNOSIS — E113293 Type 2 diabetes mellitus with mild nonproliferative diabetic retinopathy without macular edema, bilateral: Secondary | ICD-10-CM | POA: Diagnosis not present

## 2023-06-13 DIAGNOSIS — K5900 Constipation, unspecified: Secondary | ICD-10-CM | POA: Diagnosis not present

## 2023-06-13 DIAGNOSIS — E782 Mixed hyperlipidemia: Secondary | ICD-10-CM | POA: Diagnosis not present

## 2023-06-13 DIAGNOSIS — Z794 Long term (current) use of insulin: Secondary | ICD-10-CM | POA: Diagnosis not present

## 2023-06-13 DIAGNOSIS — R197 Diarrhea, unspecified: Secondary | ICD-10-CM | POA: Diagnosis not present

## 2023-06-13 DIAGNOSIS — N3000 Acute cystitis without hematuria: Secondary | ICD-10-CM | POA: Diagnosis not present

## 2023-06-13 DIAGNOSIS — R1030 Lower abdominal pain, unspecified: Secondary | ICD-10-CM | POA: Diagnosis not present

## 2023-06-13 DIAGNOSIS — E1122 Type 2 diabetes mellitus with diabetic chronic kidney disease: Secondary | ICD-10-CM | POA: Diagnosis not present

## 2023-06-13 DIAGNOSIS — Z79899 Other long term (current) drug therapy: Secondary | ICD-10-CM | POA: Diagnosis not present

## 2023-06-13 DIAGNOSIS — I480 Paroxysmal atrial fibrillation: Secondary | ICD-10-CM | POA: Diagnosis not present

## 2023-06-13 DIAGNOSIS — Z1152 Encounter for screening for COVID-19: Secondary | ICD-10-CM | POA: Diagnosis not present

## 2023-06-13 DIAGNOSIS — E1165 Type 2 diabetes mellitus with hyperglycemia: Secondary | ICD-10-CM | POA: Diagnosis not present

## 2023-06-13 DIAGNOSIS — N1832 Chronic kidney disease, stage 3b: Secondary | ICD-10-CM | POA: Diagnosis not present

## 2023-06-13 DIAGNOSIS — I5030 Unspecified diastolic (congestive) heart failure: Secondary | ICD-10-CM | POA: Diagnosis not present

## 2023-06-13 DIAGNOSIS — Z951 Presence of aortocoronary bypass graft: Secondary | ICD-10-CM | POA: Diagnosis not present

## 2023-06-13 DIAGNOSIS — K219 Gastro-esophageal reflux disease without esophagitis: Secondary | ICD-10-CM | POA: Diagnosis not present

## 2023-06-13 DIAGNOSIS — E1169 Type 2 diabetes mellitus with other specified complication: Secondary | ICD-10-CM | POA: Diagnosis not present

## 2023-06-13 DIAGNOSIS — I69911 Memory deficit following unspecified cerebrovascular disease: Secondary | ICD-10-CM | POA: Diagnosis not present

## 2023-06-13 DIAGNOSIS — N179 Acute kidney failure, unspecified: Secondary | ICD-10-CM | POA: Diagnosis not present

## 2023-06-13 DIAGNOSIS — N1831 Chronic kidney disease, stage 3a: Secondary | ICD-10-CM | POA: Diagnosis not present

## 2023-06-13 DIAGNOSIS — E876 Hypokalemia: Secondary | ICD-10-CM | POA: Diagnosis not present

## 2023-06-13 LAB — COMPREHENSIVE METABOLIC PANEL WITH GFR
ALT: 11 U/L (ref 0–44)
AST: 13 U/L — ABNORMAL LOW (ref 15–41)
Albumin: 3 g/dL — ABNORMAL LOW (ref 3.5–5.0)
Alkaline Phosphatase: 61 U/L (ref 38–126)
Anion gap: 6 (ref 5–15)
BUN: 21 mg/dL (ref 8–23)
CO2: 20 mmol/L — ABNORMAL LOW (ref 22–32)
Calcium: 8.7 mg/dL — ABNORMAL LOW (ref 8.9–10.3)
Chloride: 110 mmol/L (ref 98–111)
Creatinine, Ser: 1.44 mg/dL — ABNORMAL HIGH (ref 0.44–1.00)
GFR, Estimated: 37 mL/min — ABNORMAL LOW (ref >60)
Glucose, Bld: 136 mg/dL — ABNORMAL HIGH (ref 70–99)
Potassium: 4.4 mmol/L (ref 3.5–5.1)
Sodium: 136 mmol/L (ref 135–145)
Total Bilirubin: 0.4 mg/dL (ref 0.0–1.2)
Total Protein: 5.7 g/dL — ABNORMAL LOW (ref 6.5–8.1)

## 2023-06-13 LAB — CBC WITH DIFFERENTIAL/PLATELET
Abs Immature Granulocytes: 0.02 10*3/uL (ref 0.00–0.07)
Basophils Absolute: 0.1 10*3/uL (ref 0.0–0.1)
Basophils Relative: 1 %
Eosinophils Absolute: 0.4 10*3/uL (ref 0.0–0.5)
Eosinophils Relative: 5 %
HCT: 29.7 % — ABNORMAL LOW (ref 36.0–46.0)
Hemoglobin: 9.7 g/dL — ABNORMAL LOW (ref 12.0–15.0)
Immature Granulocytes: 0 %
Lymphocytes Relative: 26 %
Lymphs Abs: 1.9 10*3/uL (ref 0.7–4.0)
MCH: 29.3 pg (ref 26.0–34.0)
MCHC: 32.7 g/dL (ref 30.0–36.0)
MCV: 89.7 fL (ref 80.0–100.0)
Monocytes Absolute: 0.5 10*3/uL (ref 0.1–1.0)
Monocytes Relative: 8 %
Neutro Abs: 4.2 10*3/uL (ref 1.7–7.7)
Neutrophils Relative %: 60 %
Platelets: 210 10*3/uL (ref 150–400)
RBC: 3.31 MIL/uL — ABNORMAL LOW (ref 3.87–5.11)
RDW: 14.1 % (ref 11.5–15.5)
WBC: 7.1 10*3/uL (ref 4.0–10.5)
nRBC: 0 % (ref 0.0–0.2)

## 2023-06-13 LAB — MAGNESIUM: Magnesium: 1.5 mg/dL — ABNORMAL LOW (ref 1.7–2.4)

## 2023-06-13 LAB — GLUCOSE, CAPILLARY
Glucose-Capillary: 130 mg/dL — ABNORMAL HIGH (ref 70–99)
Glucose-Capillary: 210 mg/dL — ABNORMAL HIGH (ref 70–99)
Glucose-Capillary: 224 mg/dL — ABNORMAL HIGH (ref 70–99)
Glucose-Capillary: 222 mg/dL — ABNORMAL HIGH (ref 70–99)

## 2023-06-13 MED ORDER — CIPROFLOXACIN HCL 500 MG PO TABS: 250.0000 mg | ORAL_TABLET | Freq: Two times a day (BID) | ORAL | Status: DC

## 2023-06-13 MED ORDER — HYDRALAZINE HCL 20 MG/ML IJ SOLN: 10.0000 mg | Freq: Four times a day (QID) | INTRAMUSCULAR | Status: DC | PRN

## 2023-06-13 MED ORDER — SODIUM CHLORIDE 0.9 % IV SOLN
1.0000 g | Freq: Every day | INTRAVENOUS | Status: DC
  Administered 2023-06-13 – 2023-06-15 (×3): 1 g via INTRAVENOUS
  Filled 2023-06-13 (×2): qty 10

## 2023-06-13 MED ORDER — OXYCODONE-ACETAMINOPHEN 5-325 MG PO TABS
1.0000 | ORAL_TABLET | Freq: Four times a day (QID) | ORAL | Status: DC | PRN
  Administered 2023-06-13 – 2023-06-24 (×11): 1 via ORAL
  Filled 2023-06-13 (×12): qty 1

## 2023-06-13 MED ORDER — MAGNESIUM SULFATE 2 GM/50ML IV SOLN
2.0000 g | Freq: Once | INTRAVENOUS | Status: AC
  Administered 2023-06-13: 2 g via INTRAVENOUS
  Filled 2023-06-13: qty 50

## 2023-06-13 MED ORDER — ONDANSETRON HCL 4 MG/2ML IJ SOLN
4.0000 mg | Freq: Once | INTRAMUSCULAR | Status: AC
  Administered 2023-06-13: 4 mg via INTRAVENOUS
  Filled 2023-06-13: qty 2

## 2023-06-13 NOTE — Assessment & Plan Note (Signed)
.   Continuing home regimen of lipid lowering therapy.  

## 2023-06-13 NOTE — Assessment & Plan Note (Signed)
 Rate controlled and currently normal sinus rhythm Continue home regimen of Eliquis Continue home regimen of metoprolol

## 2023-06-13 NOTE — Assessment & Plan Note (Signed)
 Additionally complaining of severe lower abdominal pain and dysuria CT imaging of abdomen and pelvis performed on admission unremarkable Urinalysis somewhat suggestive of urinary tract infection which could be the culprit Placing patient on

## 2023-06-13 NOTE — Assessment & Plan Note (Signed)
•   Patient is currently chest pain free  Monitoring patient on telemetry  Continue home regimen of antiplatelet therapy, lipid lowering therapy and AV nodal blocking therapy

## 2023-06-13 NOTE — Assessment & Plan Note (Addendum)
 Diarrhea seems to have nearly resolved. Suspect acute gastroenteritis, likely viral Continuing supportive care, diet advanced. Replacing electrolytes as necessary

## 2023-06-13 NOTE — Assessment & Plan Note (Signed)
.   Patient been placed on Accu-Cheks before every meal and nightly with sliding scale insulin . Holding home regimen of hypoglycemics . Hemoglobin A1C ordered . Diabetic Diet  

## 2023-06-13 NOTE — Assessment & Plan Note (Addendum)
 Resolved Monitoring potassium levels with serial chemistries.

## 2023-06-13 NOTE — Assessment & Plan Note (Addendum)
 Continue metoprolol PRN intravenous antihypertensives for excessively elevated blood pressure

## 2023-06-13 NOTE — Assessment & Plan Note (Addendum)
 Creatinine now improved. Strict input and output monitoring Monitoring renal function and electrolytes with serial chemistries Avoiding nephrotoxic agents if at all possible

## 2023-06-13 NOTE — Progress Notes (Signed)
   06/13/23 1218  TOC Brief Assessment  Insurance and Status Reviewed  Patient has primary care physician Yes  Home environment has been reviewed Home w/ spouse  Prior level of function: Modified independent  Prior/Current Home Services No current home services  Social Drivers of Health Review SDOH reviewed no interventions necessary  Readmission risk has been reviewed Yes  Transition of care needs no transition of care needs at this time

## 2023-06-13 NOTE — Evaluation (Signed)
 Physical Therapy Evaluation Only Patient Details Name: DEVANEE POMPLUN MRN: [id number] DOB: 05-Apr-1945 Today's Date: 06/13/2023  History of Present Illness  TALICIA SUI is a 78 y.o. female presents with diarrhea. CT imaging of the abdomen pelvis revealed no obvious cause of her symptoms. PMH: CAD s/p CABG, diastolic CHF, chronic kidney disease stage III, diabetes, depression, DVT, afib  Clinical Impression  Pt reports mod ind with RW at baseline, completes self care, significant other [name] completes household chores and driving. On eval, pt supv-mod ind with all mobility using RW, able to complete toileting ind, repositions socks in sitting ind, changes hospital gown in standing ind, good steadiness and activity tolerance with amb. No acute PT recommended as pt appears to be at baseline and supv-mod ind with all mobility; anticipate no f/u PT at discharge. Will sign off at this time.      If plan is discharge home, recommend the following: Assistance with cooking/housework;Assist for transportation   Can travel by private vehicle        Equipment Recommendations None recommended by PT  Recommendations for Other Services       Functional Status Assessment Patient has not had a recent decline in their functional status     Precautions / Restrictions Precautions Precautions: Fall Restrictions Weight Bearing Restrictions Per Provider Order: No      Mobility  Bed Mobility Overal bed mobility: Modified Independent             General bed mobility comments: increased time    Transfers Overall transfer level: Needs assistance Equipment used: Rolling walker (2 wheels) Transfers: Sit to/from Stand Sit to Stand: Supervision, Modified independent (Device/Increase time)           General transfer comment: BUE assisting to rise from EOB, toilet and recliner, therapist managing IV line for safety    Ambulation/Gait Ambulation/Gait assistance: Supervision, Modified independent  (Device/Increase time) Gait Distance (Feet): 400 Feet Assistive device: Rolling walker (2 wheels) Gait Pattern/deviations: Step-through pattern, Decreased stride length Gait velocity: WFL     General Gait Details: step through gait pattern, good bil foot clearance, able to clear past obstacles in hallway, cadence WFL, no overt LOB or LE Buckling noted  Stairs            Wheelchair Mobility     Tilt Bed    Modified Rankin (Stroke Patients Only)       Balance   Sitting-balance support: Feet supported Sitting balance-Leahy Scale: Good     Standing balance support: During functional activity, No upper extremity supported Standing balance-Leahy Scale: Fair                               Pertinent Vitals/Pain Pain Assessment Pain Assessment: Faces Faces Pain Scale: Hurts a little bit Pain Location: abdomen Pain Descriptors / Indicators: Cramping Pain Intervention(s): Limited activity within patient's tolerance, Monitored during session    Home Living Family/patient expects to be discharged to:: Private residence Living Arrangements: Non-relatives/Friends [name] for over 30 years) Available Help at Discharge: Available 24 hours/day;Available PRN/intermittently;Friend(s) Type of Home: House Home Access: Stairs to enter Entrance Stairs-Rails: Doctor, general practice of Steps: 2   Home Layout: One level Home Equipment: Agricultural consultant (2 wheels);Shower seat      Prior Function               Mobility Comments: pt reports using RW in teh home and out in community, enjoys walking  daily for exercise ADLs Comments: pt reports ind with self care, [name] completes household chores     Extremity/Trunk Assessment   Upper Extremity Assessment Upper Extremity Assessment: Overall WFL for tasks assessed    Lower Extremity Assessment Lower Extremity Assessment: Overall WFL for tasks assessed    Cervical / Trunk Assessment Cervical / Trunk  Assessment: Normal  Communication   Communication Communication: No apparent difficulties    Cognition Arousal: Alert Behavior During Therapy: WFL for tasks assessed/performed   PT - Cognitive impairments: No apparent impairments                       PT - Cognition Comments: pt able to state name, DOB, year correctly, states date is "july 5th", states current president correctly. Pt follows commands, pleasant Following commands: Intact       Cueing       General Comments      Exercises     Assessment/Plan    PT Assessment Patient does not need any further PT services  PT Problem List Decreased activity tolerance;Decreased balance;Decreased knowledge of use of DME       PT Treatment Interventions DME instruction;Gait training;Functional mobility training;Therapeutic activities;Therapeutic exercise;Balance training;Patient/family education    PT Goals (Current goals can be found in the Care Plan section)  Acute Rehab PT Goals Patient Stated Goal: agreeable to therapy PT Goal Formulation: All assessment and education complete, DC therapy    Frequency Min 3X/week     Co-evaluation               AM-PAC PT "6 Clicks" Mobility  Outcome Measure Help needed turning from your back to your side while in a flat bed without using bedrails?: None Help needed moving from lying on your back to sitting on the side of a flat bed without using bedrails?: None Help needed moving to and from a bed to a chair (including a wheelchair)?: A Little Help needed standing up from a chair using your arms (e.g., wheelchair or bedside chair)?: A Little Help needed to walk in hospital room?: A Little Help needed climbing 3-5 steps with a railing? : A Little 6 Click Score: 20    End of Session Equipment Utilized During Treatment: Gait belt Activity Tolerance: Patient tolerated treatment well Patient left: in chair;with call bell/phone within reach Nurse Communication:  Mobility status PT Visit Diagnosis: Other abnormalities of gait and mobility (R26.89)    Time: 1119-1140 PT Time Calculation (min) (ACUTE ONLY): 21 min   Charges:   PT Evaluation $PT Eval Low Complexity: 1 Low   PT General Charges $$ ACUTE PT VISIT: 1 Visit          Tori Lesta Limbert PT, DPT 06/13/23, 11:46 AM

## 2023-06-13 NOTE — Progress Notes (Signed)
 PROGRESS NOTE   Maria Fox  WUJ:[id number] DOB: Sep 20, 1945 DOA: 06/11/2023 PCP: Eustaquio Boyden, MD   Date of Service: the patient was seen and examined on 06/13/2023  Brief Narrative:  78 y.o. female with history of CAD status post CABG, diastolic CHF, chronic kidney disease stage III, diabetes mellitus type 2, depression, history of DVT, history of atrial fibrillation presents to the [facility] ER after patient has been having persistent watery diarrhea and abdominal pain for the last 1 week.    Upon evaluation in the emergency department CT imaging of the abdomen pelvis revealed no obvious cause of her symptoms.  Patient did exhibit  worsening renal function with clinical evidence of dehydration and therefore the hospitalist group was called to assess the patient for admission to the hospital.  Patient was admitted to hospital service and placed on intravenous fluids.   Assessment & Plan Acute diarrhea Diarrhea seems to have nearly resolved. Suspect acute gastroenteritis, likely viral Continuing supportive care, diet advanced. Replacing electrolytes as necessary Lower abdominal pain Additionally complaining of severe lower abdominal pain and dysuria CT imaging of abdomen and pelvis performed on admission unremarkable Urinalysis somewhat suggestive of urinary tract infection which could be the culprit Placing patient on Acute renal failure superimposed on stage 3a chronic kidney disease ([name]) Creatinine now improved. Strict input and output monitoring Monitoring renal function and electrolytes with serial chemistries Avoiding nephrotoxic agents if at all possible  Hypokalemia Resolved Monitoring potassium levels with serial chemistries.  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      Subjective:  Patient is planning of abdominal pain.  Abdominal pain is lower in location, severe in intensity, sharp in quality and associated with dysuria.   Physical Exam:  Vitals:   06/12/23 0735 06/12/23 1159 06/12/23 2039 06/13/23 0539  BP: (!) 157/88 125/69 123/72 133/61  Pulse: 69 64 74 60  Resp: 17  16 16   Temp: (!) 97.5 F (36.4 C) 97.7 F (36.5 C) 98.6 F (37 C) 98.4 F (36.9 C)  TempSrc:  Oral    SpO2: 100% 98% 100% 99%  Weight:      Height:        Constitutional: Awake alert and oriented x3, patient is in distress due to lower abdominal pain. Skin: no rashes, no lesions, poor skin turgor noted. Eyes: Pupils are equally reactive to light.  No evidence of scleral icterus or conjunctival pallor.  ENMT: Dry mucous membranes noted.  Posterior pharynx clear of any exudate or lesions.   Respiratory: clear to auscultation bilaterally, no wheezing, no crackles. Normal respiratory effort. No accessory muscle use.  Cardiovascular: Regular rate and rhythm, no murmurs / rubs / gallops. No extremity edema. 2+ pedal pulses. No carotid bruits.  Abdomen: Significant lower abdominal tenderness.  Abdomen is soft.  No evidence of intra-abdominal  masses.  Positive bowel sounds noted in all quadrants.   Musculoskeletal: No  joint deformity upper and lower extremities. Good ROM, no contractures. Normal muscle tone.    Data Reviewed:  I have personally reviewed and interpreted labs, imaging.  Significant findings are   CBC: Recent Labs  Lab 06/11/23 2006 06/12/23 0535 06/13/23 0531  WBC 9.6 6.5 7.1  NEUTROABS 7.5 3.9 4.2  HGB 13.7 10.6* 9.7*  HCT 41.2 31.9* 29.7*  MCV 87.1 88.1 89.7  PLT 265 234 210   Basic Metabolic Panel: Recent Labs  Lab 06/11/23 2006 06/12/23 0535 06/13/23 0531  NA 135 133* 136  K 4.4 3.2* 4.4  CL 103 108 110  CO2 16* 19* 20*  GLUCOSE 339* 193* 136*  BUN 28* 23 21  CREATININE 1.82* 1.43* 1.44*  CALCIUM 9.9 8.0* 8.7*  MG  --   --  1.5*   GFR: Estimated Creatinine Clearance: 25.8 mL/min (A) (by C-G formula based on SCr of 1.44 mg/dL (H)). Liver Function Tests: Recent Labs  Lab 06/11/23 2006 06/12/23 0535 06/13/23 0531  AST 20 23 13*  ALT 13 13 11   ALKPHOS 91 63 61  BILITOT 1.4* 0.6 0.4  PROT 9.1* 6.3* 5.7*  ALBUMIN 4.5 3.3* 3.0*    Code Status:  Full code.  Code status decision has been confirmed with: patient   Severity of Illness:  The appropriate patient status for this patient is OBSERVATION. Observation status is judged to be reasonable and necessary in order to provide the required intensity of service to ensure the patient's safety. The patient's presenting symptoms, physical exam findings, and initial radiographic and laboratory data in the context of their medical condition is felt to place them at decreased risk for further clinical deterioration. Furthermore, it is anticipated that the patient will be medically stable for discharge from the hospital within 2 midnights of admission.   Time spent:  45 minutes  Author:  Marinda Elk MD  06/13/2023 9:17 AM

## 2023-06-14 DIAGNOSIS — E1122 Type 2 diabetes mellitus with diabetic chronic kidney disease: Secondary | ICD-10-CM | POA: Diagnosis not present

## 2023-06-14 DIAGNOSIS — K5900 Constipation, unspecified: Secondary | ICD-10-CM | POA: Diagnosis not present

## 2023-06-14 DIAGNOSIS — N179 Acute kidney failure, unspecified: Secondary | ICD-10-CM | POA: Diagnosis not present

## 2023-06-14 DIAGNOSIS — N1832 Chronic kidney disease, stage 3b: Secondary | ICD-10-CM | POA: Diagnosis not present

## 2023-06-14 DIAGNOSIS — I480 Paroxysmal atrial fibrillation: Secondary | ICD-10-CM | POA: Diagnosis not present

## 2023-06-14 DIAGNOSIS — E782 Mixed hyperlipidemia: Secondary | ICD-10-CM | POA: Diagnosis not present

## 2023-06-14 DIAGNOSIS — E1165 Type 2 diabetes mellitus with hyperglycemia: Secondary | ICD-10-CM | POA: Diagnosis not present

## 2023-06-14 DIAGNOSIS — I130 Hypertensive heart and chronic kidney disease with heart failure and stage 1 through stage 4 chronic kidney disease, or unspecified chronic kidney disease: Secondary | ICD-10-CM | POA: Diagnosis not present

## 2023-06-14 DIAGNOSIS — E113293 Type 2 diabetes mellitus with mild nonproliferative diabetic retinopathy without macular edema, bilateral: Secondary | ICD-10-CM | POA: Diagnosis not present

## 2023-06-14 DIAGNOSIS — I5030 Unspecified diastolic (congestive) heart failure: Secondary | ICD-10-CM | POA: Diagnosis not present

## 2023-06-14 DIAGNOSIS — Z951 Presence of aortocoronary bypass graft: Secondary | ICD-10-CM | POA: Diagnosis not present

## 2023-06-14 DIAGNOSIS — E876 Hypokalemia: Secondary | ICD-10-CM | POA: Diagnosis not present

## 2023-06-14 DIAGNOSIS — N1831 Chronic kidney disease, stage 3a: Secondary | ICD-10-CM | POA: Diagnosis not present

## 2023-06-14 DIAGNOSIS — K219 Gastro-esophageal reflux disease without esophagitis: Secondary | ICD-10-CM | POA: Diagnosis not present

## 2023-06-14 DIAGNOSIS — Z794 Long term (current) use of insulin: Secondary | ICD-10-CM | POA: Diagnosis not present

## 2023-06-14 DIAGNOSIS — Z79899 Other long term (current) drug therapy: Secondary | ICD-10-CM | POA: Diagnosis not present

## 2023-06-14 DIAGNOSIS — Z1152 Encounter for screening for COVID-19: Secondary | ICD-10-CM | POA: Diagnosis not present

## 2023-06-14 DIAGNOSIS — N3000 Acute cystitis without hematuria: Secondary | ICD-10-CM | POA: Diagnosis not present

## 2023-06-14 DIAGNOSIS — E1169 Type 2 diabetes mellitus with other specified complication: Secondary | ICD-10-CM | POA: Diagnosis not present

## 2023-06-14 DIAGNOSIS — R197 Diarrhea, unspecified: Secondary | ICD-10-CM | POA: Diagnosis not present

## 2023-06-14 DIAGNOSIS — I2510 Atherosclerotic heart disease of native coronary artery without angina pectoris: Secondary | ICD-10-CM | POA: Diagnosis not present

## 2023-06-14 DIAGNOSIS — I69911 Memory deficit following unspecified cerebrovascular disease: Secondary | ICD-10-CM | POA: Diagnosis not present

## 2023-06-14 LAB — CBC WITH DIFFERENTIAL/PLATELET
Abs Immature Granulocytes: 0.02 10*3/uL (ref 0.00–0.07)
Basophils Absolute: 0.1 10*3/uL (ref 0.0–0.1)
Basophils Relative: 1 %
Eosinophils Absolute: 0.4 10*3/uL (ref 0.0–0.5)
Eosinophils Relative: 6 %
HCT: 32.5 % — ABNORMAL LOW (ref 36.0–46.0)
Hemoglobin: 10.5 g/dL — ABNORMAL LOW (ref 12.0–15.0)
Immature Granulocytes: 0 %
Lymphocytes Relative: 24 %
Lymphs Abs: 1.4 10*3/uL (ref 0.7–4.0)
MCH: 29.2 pg (ref 26.0–34.0)
MCHC: 32.3 g/dL (ref 30.0–36.0)
MCV: 90.3 fL (ref 80.0–100.0)
Monocytes Absolute: 0.5 10*3/uL (ref 0.1–1.0)
Monocytes Relative: 9 %
Neutro Abs: 3.5 10*3/uL (ref 1.7–7.7)
Neutrophils Relative %: 60 %
Platelets: 194 10*3/uL (ref 150–400)
RBC: 3.6 MIL/uL — ABNORMAL LOW (ref 3.87–5.11)
RDW: 14.2 % (ref 11.5–15.5)
WBC: 5.9 10*3/uL (ref 4.0–10.5)
nRBC: 0 % (ref 0.0–0.2)

## 2023-06-14 LAB — COMPREHENSIVE METABOLIC PANEL WITH GFR
ALT: 8 U/L (ref 0–44)
AST: 11 U/L — ABNORMAL LOW (ref 15–41)
Albumin: 3 g/dL — ABNORMAL LOW (ref 3.5–5.0)
Alkaline Phosphatase: 55 U/L (ref 38–126)
Anion gap: 9 (ref 5–15)
BUN: 18 mg/dL (ref 8–23)
CO2: 19 mmol/L — ABNORMAL LOW (ref 22–32)
Calcium: 8.7 mg/dL — ABNORMAL LOW (ref 8.9–10.3)
Chloride: 107 mmol/L (ref 98–111)
Creatinine, Ser: 1.26 mg/dL — ABNORMAL HIGH (ref 0.44–1.00)
GFR, Estimated: 43 mL/min — ABNORMAL LOW (ref >60)
Glucose, Bld: 170 mg/dL — ABNORMAL HIGH (ref 70–99)
Potassium: 4.6 mmol/L (ref 3.5–5.1)
Sodium: 135 mmol/L (ref 135–145)
Total Bilirubin: 0.6 mg/dL (ref 0.0–1.2)
Total Protein: 6.1 g/dL — ABNORMAL LOW (ref 6.5–8.1)

## 2023-06-14 LAB — GLUCOSE, CAPILLARY
Glucose-Capillary: 238 mg/dL — ABNORMAL HIGH (ref 70–99)
Glucose-Capillary: 244 mg/dL — ABNORMAL HIGH (ref 70–99)
Glucose-Capillary: 143 mg/dL — ABNORMAL HIGH (ref 70–99)
Glucose-Capillary: 158 mg/dL — ABNORMAL HIGH (ref 70–99)

## 2023-06-14 LAB — MAGNESIUM: Magnesium: 1.7 mg/dL (ref 1.7–2.4)

## 2023-06-14 LAB — HEMOGLOBIN A1C
Hgb A1c MFr Bld: 8.7 % — ABNORMAL HIGH (ref 4.8–5.6)
Mean Plasma Glucose: 203 mg/dL

## 2023-06-14 MED ORDER — INSULIN ASPART 100 UNIT/ML IJ SOLN
0.0000 [IU] | Freq: Three times a day (TID) | INTRAMUSCULAR | Status: DC
  Administered 2023-06-15: 5 [IU] via SUBCUTANEOUS
  Administered 2023-06-15: 2 [IU] via SUBCUTANEOUS
  Administered 2023-06-15 (×2): 3 [IU] via SUBCUTANEOUS
  Administered 2023-06-16: 5 [IU] via SUBCUTANEOUS
  Administered 2023-06-16: 3 [IU] via SUBCUTANEOUS
  Administered 2023-06-16: 2 [IU] via SUBCUTANEOUS
  Administered 2023-06-16: 5 [IU] via SUBCUTANEOUS
  Administered 2023-06-17: 3 [IU] via SUBCUTANEOUS
  Administered 2023-06-17: 15 [IU] via SUBCUTANEOUS
  Administered 2023-06-17 – 2023-06-18 (×2): 5 [IU] via SUBCUTANEOUS
  Administered 2023-06-18: 15 [IU] via SUBCUTANEOUS
  Administered 2023-06-18 – 2023-06-19 (×2): 5 [IU] via SUBCUTANEOUS
  Administered 2023-06-19: 2 [IU] via SUBCUTANEOUS
  Administered 2023-06-19 – 2023-06-20 (×2): 11 [IU] via SUBCUTANEOUS
  Administered 2023-06-20: 3 [IU] via SUBCUTANEOUS
  Administered 2023-06-20: 5 [IU] via SUBCUTANEOUS
  Administered 2023-06-21: 8 [IU] via SUBCUTANEOUS
  Administered 2023-06-21: 3 [IU] via SUBCUTANEOUS
  Administered 2023-06-21: 11 [IU] via SUBCUTANEOUS
  Administered 2023-06-21: 2 [IU] via SUBCUTANEOUS
  Administered 2023-06-22: 3 [IU] via SUBCUTANEOUS
  Administered 2023-06-22: 15 [IU] via SUBCUTANEOUS
  Administered 2023-06-22: 5 [IU] via SUBCUTANEOUS
  Administered 2023-06-23: 3 [IU] via SUBCUTANEOUS
  Administered 2023-06-23: 11 [IU] via SUBCUTANEOUS
  Administered 2023-06-23: 5 [IU] via SUBCUTANEOUS
  Administered 2023-06-24 (×3): 3 [IU] via SUBCUTANEOUS

## 2023-06-14 NOTE — Assessment & Plan Note (Signed)
 Rate controlled and currently normal sinus rhythm Continue home regimen of Eliquis Continue home regimen of metoprolol

## 2023-06-14 NOTE — Assessment & Plan Note (Addendum)
 Additionally complaining of severe lower abdominal pain and dysuria CT imaging of abdomen and pelvis performed on admission unremarkable Urinalysis somewhat suggestive of urinary tract infection which could be the culprit Culture still pending Patient is on intravenous ceftriaxone, day 2

## 2023-06-14 NOTE — Progress Notes (Signed)
 PROGRESS NOTE   Maria Fox  ZOX:[id number] DOB: September 27, 1945 DOA: 06/11/2023 PCP: Eustaquio Boyden, MD   Date of Service: the patient was seen and examined on 06/14/2023  Brief Narrative:  78 y.o. female with history of CAD status post CABG, diastolic CHF, chronic kidney disease stage III, diabetes mellitus type 2, depression, history of DVT, history of atrial fibrillation presents to the [facility] ER after patient has been having persistent watery diarrhea and abdominal pain for the last 1 week.    Upon evaluation in the emergency department CT imaging of the abdomen pelvis revealed no obvious cause of her symptoms.  Patient did exhibit  worsening renal function with clinical evidence of dehydration and therefore the hospitalist group was called to assess the patient for admission to the hospital.  Patient was admitted to hospital service and placed on intravenous fluids.   Assessment & Plan Acute diarrhea Diarrhea resolved Suspect this was acute gastroenteritis, likely viral Continuing supportive care, diet advanced. Replacing electrolytes as necessary Acute cystitis without hematuria Additionally complaining of severe lower abdominal pain and dysuria CT imaging of abdomen and pelvis performed on admission unremarkable Urinalysis somewhat suggestive of urinary tract infection which could be the culprit Culture still pending Patient is on intravenous ceftriaxone, day 2 Acute renal failure superimposed on stage 3a chronic kidney disease ([name]) Creatinine now improved. Strict input and output monitoring Monitoring renal function and electrolytes with serial chemistries Avoiding nephrotoxic agents if at all possible  Hypokalemia Resolved Monitoring potassium levels with serial chemistries.  Paroxysmal atrial fibrillation ([name]) Rate controlled and currently normal sinus rhythm Continue home regimen of Eliquis Continue home regimen of metoprolol CAD, multiple vessel s/p  CABG x 3 Patient is currently chest pain free Monitoring patient on telemetry Continue home regimen of antiplatelet therapy, lipid lowering therapy and AV nodal blocking therapy  Essential hypertension Continue metoprolol PRN intravenous antihypertensives for excessively elevated blood pressure  Type 2 diabetes mellitus with mild nonproliferative diabetic retinopathy without macular edema, bilateral ([name]) Patient been placed on Accu-Cheks before every meal and nightly with sliding scale insulin Holding home regimen of hypoglycemics Hemoglobin A1C 8.7% suggesting poor outpatient management. Diabetic Diet  Mixed diabetic hyperlipidemia associated with type 2 diabetes mellitus ([name]) Continuing home regimen of lipid lowering therapy.      Subjective:  Patient is to complain of abdominal pain, severe in intensity, lower abdominal in location, nonradiating and sharp in quality associated with dysuria.     Physical Exam:  Vitals:   06/13/23 0539 06/13/23 1143 06/13/23 2030 06/14/23 0414  BP: 133/61 115/72 123/65 128/63  Pulse: 60 64 63 61  Resp: 16 16 16 16   Temp: 98.4 F (36.9 C) 97.6 F (36.4 C) (!) 97.5 F (36.4 C) 97.8 F (36.6 C)  TempSrc:   Oral Oral  SpO2: 99% 100% 99% 98%  Weight:      Height:        Constitutional: Awake alert and oriented x3, patient is in distress due to lower abdominal pain. Skin: no rashes, no lesions, poor skin turgor noted. Eyes: Pupils are equally reactive to light.  No evidence of scleral icterus or conjunctival pallor.  ENMT: Dry mucous membranes noted.  Posterior pharynx clear of any exudate or lesions.   Respiratory: clear to auscultation bilaterally, no wheezing, no crackles. Normal respiratory effort. No accessory muscle use.  Cardiovascular: Regular rate and rhythm, no murmurs / rubs / gallops. No extremity edema. 2+ pedal pulses. No carotid bruits.  Abdomen: Significant lower abdominal  tenderness.  Abdomen is soft.  No evidence of  intra-abdominal masses.  Positive bowel sounds noted in all quadrants.   Musculoskeletal: No joint deformity upper and lower extremities. Good ROM, no contractures. Normal muscle tone.    Data Reviewed:  I have personally reviewed and interpreted labs, imaging.  Significant findings are   CBC: Recent Labs  Lab 06/11/23 2006 06/12/23 0535 06/13/23 0531 06/14/23 0543  WBC 9.6 6.5 7.1 5.9  NEUTROABS 7.5 3.9 4.2 3.5  HGB 13.7 10.6* 9.7* 10.5*  HCT 41.2 31.9* 29.7* 32.5*  MCV 87.1 88.1 89.7 90.3  PLT 265 234 210 194   Basic Metabolic Panel: Recent Labs  Lab 06/11/23 2006 06/12/23 0535 06/13/23 0531 06/14/23 0543  NA 135 133* 136 135  K 4.4 3.2* 4.4 4.6  CL 103 108 110 107  CO2 16* 19* 20* 19*  GLUCOSE 339* 193* 136* 170*  BUN 28* 23 21 18   CREATININE 1.82* 1.43* 1.44* 1.26*  CALCIUM 9.9 8.0* 8.7* 8.7*  MG  --   --  1.5* 1.7   GFR: Estimated Creatinine Clearance: 29.5 mL/min (A) (by C-G formula based on SCr of 1.26 mg/dL (H)). Liver Function Tests: Recent Labs  Lab 06/11/23 2006 06/12/23 0535 06/13/23 0531 06/14/23 0543  AST 20 23 13* 11*  ALT 13 13 11 8   ALKPHOS 91 63 61 55  BILITOT 1.4* 0.6 0.4 0.6  PROT 9.1* 6.3* 5.7* 6.1*  ALBUMIN 4.5 3.3* 3.0* 3.0*    Code Status:  Full code.  Code status decision has been confirmed with: patient   Severity of Illness:  The appropriate patient status for this patient is OBSERVATION. Observation status is judged to be reasonable and necessary in order to provide the required intensity of service to ensure the patient's safety. The patient's presenting symptoms, physical exam findings, and initial radiographic and laboratory data in the context of their medical condition is felt to place them at decreased risk for further clinical deterioration. Furthermore, it is anticipated that the patient will be medically stable for discharge from the hospital within 2 midnights of admission.   Time spent:  40  minutes  Author:  Marinda Elk MD  06/14/2023 8:50 AM

## 2023-06-14 NOTE — Assessment & Plan Note (Signed)
 Resolved Monitoring potassium levels with serial chemistries.

## 2023-06-14 NOTE — Assessment & Plan Note (Signed)
 Creatinine now improved. Strict input and output monitoring Monitoring renal function and electrolytes with serial chemistries Avoiding nephrotoxic agents if at all possible

## 2023-06-14 NOTE — Assessment & Plan Note (Signed)
•   Patient is currently chest pain free  Monitoring patient on telemetry  Continue home regimen of antiplatelet therapy, lipid lowering therapy and AV nodal blocking therapy

## 2023-06-14 NOTE — Progress Notes (Signed)
 Mobility Specialist - Progress Note   06/14/23 0952  Mobility  Activity Ambulated with assistance in hallway  Level of Assistance Standby assist, set-up cues, supervision of patient - no hands on  Assistive Device Front wheel walker  Distance Ambulated (ft) 300 ft  Range of Motion/Exercises Active  Activity Response Tolerated fair  Mobility Referral Yes  Mobility visit 1 Mobility  Mobility Specialist Start Time (ACUTE ONLY) 0940  Mobility Specialist Stop Time (ACUTE ONLY) [id number]  Mobility Specialist Time Calculation (min) (ACUTE ONLY) 12 min   Pt was found in bed and agreeable to mobilize. Stated being drunk during session and have BLE weakness. At EOS returned to bed with all needs met. Call bell in reach.  Billey Chang Mobility Specialist

## 2023-06-14 NOTE — Assessment & Plan Note (Addendum)
 Patient been placed on Accu-Cheks before every meal and nightly with sliding scale insulin Holding home regimen of hypoglycemics Hemoglobin A1C 8.7% suggesting poor outpatient management. Diabetic Diet

## 2023-06-14 NOTE — Assessment & Plan Note (Signed)
 Continue metoprolol PRN intravenous antihypertensives for excessively elevated blood pressure

## 2023-06-14 NOTE — Assessment & Plan Note (Addendum)
 Diarrhea resolved Suspect this was acute gastroenteritis, likely viral Continuing supportive care, diet advanced. Replacing electrolytes as necessary

## 2023-06-14 NOTE — Assessment & Plan Note (Signed)
.   Continuing home regimen of lipid lowering therapy.  

## 2023-06-14 NOTE — Plan of Care (Signed)

## 2023-06-14 NOTE — Plan of Care (Signed)

## 2023-06-15 DIAGNOSIS — I480 Paroxysmal atrial fibrillation: Secondary | ICD-10-CM | POA: Diagnosis not present

## 2023-06-15 DIAGNOSIS — K219 Gastro-esophageal reflux disease without esophagitis: Secondary | ICD-10-CM | POA: Diagnosis not present

## 2023-06-15 DIAGNOSIS — E1169 Type 2 diabetes mellitus with other specified complication: Secondary | ICD-10-CM | POA: Diagnosis not present

## 2023-06-15 DIAGNOSIS — E1165 Type 2 diabetes mellitus with hyperglycemia: Secondary | ICD-10-CM | POA: Diagnosis not present

## 2023-06-15 DIAGNOSIS — Z951 Presence of aortocoronary bypass graft: Secondary | ICD-10-CM | POA: Diagnosis not present

## 2023-06-15 DIAGNOSIS — K5900 Constipation, unspecified: Secondary | ICD-10-CM | POA: Diagnosis not present

## 2023-06-15 DIAGNOSIS — Z79899 Other long term (current) drug therapy: Secondary | ICD-10-CM | POA: Diagnosis not present

## 2023-06-15 DIAGNOSIS — E876 Hypokalemia: Secondary | ICD-10-CM | POA: Diagnosis not present

## 2023-06-15 DIAGNOSIS — I130 Hypertensive heart and chronic kidney disease with heart failure and stage 1 through stage 4 chronic kidney disease, or unspecified chronic kidney disease: Secondary | ICD-10-CM | POA: Diagnosis not present

## 2023-06-15 DIAGNOSIS — E113293 Type 2 diabetes mellitus with mild nonproliferative diabetic retinopathy without macular edema, bilateral: Secondary | ICD-10-CM | POA: Diagnosis not present

## 2023-06-15 DIAGNOSIS — E782 Mixed hyperlipidemia: Secondary | ICD-10-CM | POA: Diagnosis not present

## 2023-06-15 DIAGNOSIS — E1122 Type 2 diabetes mellitus with diabetic chronic kidney disease: Secondary | ICD-10-CM | POA: Diagnosis not present

## 2023-06-15 DIAGNOSIS — N179 Acute kidney failure, unspecified: Secondary | ICD-10-CM | POA: Diagnosis not present

## 2023-06-15 DIAGNOSIS — R1030 Lower abdominal pain, unspecified: Secondary | ICD-10-CM | POA: Diagnosis not present

## 2023-06-15 DIAGNOSIS — N3000 Acute cystitis without hematuria: Secondary | ICD-10-CM | POA: Diagnosis not present

## 2023-06-15 DIAGNOSIS — N1832 Chronic kidney disease, stage 3b: Secondary | ICD-10-CM | POA: Diagnosis not present

## 2023-06-15 DIAGNOSIS — I69911 Memory deficit following unspecified cerebrovascular disease: Secondary | ICD-10-CM | POA: Diagnosis not present

## 2023-06-15 DIAGNOSIS — Z1152 Encounter for screening for COVID-19: Secondary | ICD-10-CM | POA: Diagnosis not present

## 2023-06-15 DIAGNOSIS — N1831 Chronic kidney disease, stage 3a: Secondary | ICD-10-CM | POA: Diagnosis not present

## 2023-06-15 DIAGNOSIS — I5030 Unspecified diastolic (congestive) heart failure: Secondary | ICD-10-CM | POA: Diagnosis not present

## 2023-06-15 DIAGNOSIS — R197 Diarrhea, unspecified: Secondary | ICD-10-CM | POA: Diagnosis not present

## 2023-06-15 DIAGNOSIS — Z794 Long term (current) use of insulin: Secondary | ICD-10-CM | POA: Diagnosis not present

## 2023-06-15 DIAGNOSIS — I10 Essential (primary) hypertension: Secondary | ICD-10-CM | POA: Diagnosis not present

## 2023-06-15 DIAGNOSIS — I2510 Atherosclerotic heart disease of native coronary artery without angina pectoris: Secondary | ICD-10-CM | POA: Diagnosis not present

## 2023-06-15 LAB — RENAL FUNCTION PANEL
Albumin: 3.2 g/dL — ABNORMAL LOW (ref 3.5–5.0)
Anion gap: 10 (ref 5–15)
BUN: 18 mg/dL (ref 8–23)
CO2: 18 mmol/L — ABNORMAL LOW (ref 22–32)
Calcium: 9 mg/dL (ref 8.9–10.3)
Chloride: 104 mmol/L (ref 98–111)
Creatinine, Ser: 1.07 mg/dL — ABNORMAL HIGH (ref 0.44–1.00)
GFR, Estimated: 53 mL/min — ABNORMAL LOW (ref >60)
Glucose, Bld: 206 mg/dL — ABNORMAL HIGH (ref 70–99)
Phosphorus: 2.8 mg/dL (ref 2.5–4.6)
Potassium: 4.5 mmol/L (ref 3.5–5.1)
Sodium: 132 mmol/L — ABNORMAL LOW (ref 135–145)

## 2023-06-15 LAB — CBC WITH DIFFERENTIAL/PLATELET
Abs Immature Granulocytes: 0.03 10*3/uL (ref 0.00–0.07)
Basophils Absolute: 0.1 10*3/uL (ref 0.0–0.1)
Basophils Relative: 1 %
Eosinophils Absolute: 0.4 10*3/uL (ref 0.0–0.5)
Eosinophils Relative: 6 %
HCT: 34.5 % — ABNORMAL LOW (ref 36.0–46.0)
Hemoglobin: 11.2 g/dL — ABNORMAL LOW (ref 12.0–15.0)
Immature Granulocytes: 0 %
Lymphocytes Relative: 25 %
Lymphs Abs: 1.8 10*3/uL (ref 0.7–4.0)
MCH: 28.6 pg (ref 26.0–34.0)
MCHC: 32.5 g/dL (ref 30.0–36.0)
MCV: 88 fL (ref 80.0–100.0)
Monocytes Absolute: 0.5 10*3/uL (ref 0.1–1.0)
Monocytes Relative: 7 %
Neutro Abs: 4.2 10*3/uL (ref 1.7–7.7)
Neutrophils Relative %: 61 %
Platelets: 222 10*3/uL (ref 150–400)
RBC: 3.92 MIL/uL (ref 3.87–5.11)
RDW: 14.1 % (ref 11.5–15.5)
WBC: 6.9 10*3/uL (ref 4.0–10.5)
nRBC: 0 % (ref 0.0–0.2)

## 2023-06-15 LAB — URINE CULTURE: Culture: >=100000 — AB

## 2023-06-15 LAB — GLUCOSE, CAPILLARY
Glucose-Capillary: 196 mg/dL — ABNORMAL HIGH (ref 70–99)
Glucose-Capillary: 209 mg/dL — ABNORMAL HIGH (ref 70–99)
Glucose-Capillary: 139 mg/dL — ABNORMAL HIGH (ref 70–99)
Glucose-Capillary: 191 mg/dL — ABNORMAL HIGH (ref 70–99)

## 2023-06-15 MED ORDER — SODIUM CHLORIDE 0.9 % IV SOLN
2.0000 g | Freq: Two times a day (BID) | INTRAVENOUS | Status: DC
  Administered 2023-06-15 (×2): 2 g via INTRAVENOUS
  Filled 2023-06-15 (×3): qty 12.5

## 2023-06-15 MED ORDER — ONDANSETRON HCL 4 MG/2ML IJ SOLN
4.0000 mg | Freq: Four times a day (QID) | INTRAMUSCULAR | Status: DC | PRN
  Administered 2023-06-15 – 2023-06-24 (×5): 4 mg via INTRAVENOUS
  Filled 2023-06-15 (×5): qty 2

## 2023-06-15 NOTE — Assessment & Plan Note (Addendum)
.   Continuing home regimen of lipid lowering therapy.  

## 2023-06-15 NOTE — Assessment & Plan Note (Addendum)
 Patient complaining of depressed mood with suicidal ideation. Patient voices plan to kill herself by overdosing on medications as soon as she is discharged. Patient will not contract for safety however patient is willing to remain in the hospital therefore IVC has not been placed Order for psychiatry consultation placed Suicide precautions, will order sitter. Of note, patient was [facility] from 3/19 until 3/28 for suicidal ideations.  Patient was rounded on daily by psychiatry until discharge.

## 2023-06-15 NOTE — Assessment & Plan Note (Signed)
 Diarrhea resolved.   ?

## 2023-06-15 NOTE — Assessment & Plan Note (Addendum)
 Creatinine now improved. Strict input and output monitoring Monitoring renal function and electrolytes with serial chemistries Avoiding nephrotoxic agents if at all possible

## 2023-06-15 NOTE — Assessment & Plan Note (Addendum)
 Rate controlled and currently normal sinus rhythm Continue home regimen of Eliquis Continue home regimen of metoprolol

## 2023-06-15 NOTE — Plan of Care (Signed)

## 2023-06-15 NOTE — Assessment & Plan Note (Signed)
 Urine cultures growing out Proteus mirabilis and Enterobacter cloacae  Antibiotics switched from intravenous ceftriaxone initiated on 4/4 to intravenous cefepime.   Patient continuing to complain of severe lower abdominal pain, hopefully pain will improve with appropriate intravenous antibiotic therapy.  CT imaging of abdomen and pelvis performed on admission unremarkable

## 2023-06-15 NOTE — Assessment & Plan Note (Addendum)
 Patient been placed on Accu-Cheks before every meal and nightly with sliding scale insulin Holding home regimen of hypoglycemics Hemoglobin A1C 8.7% suggesting poor outpatient management. Diabetic Diet

## 2023-06-15 NOTE — Assessment & Plan Note (Addendum)
•   Patient is currently chest pain free  Monitoring patient on telemetry  Continue home regimen of antiplatelet therapy, lipid lowering therapy and AV nodal blocking therapy

## 2023-06-15 NOTE — Assessment & Plan Note (Addendum)
 Continue metoprolol PRN intravenous antihypertensives for excessively elevated blood pressure

## 2023-06-15 NOTE — Assessment & Plan Note (Addendum)
 Resolved Monitoring potassium levels with serial chemistries.

## 2023-06-15 NOTE — Progress Notes (Addendum)
 PROGRESS NOTE   Maria Fox  WUJ:[id number] DOB: 19-Mar-1945 DOA: 06/11/2023 PCP: Eustaquio Boyden, MD   Date of Service: the patient was seen and examined on 06/15/2023  Brief Narrative:  78 y.o. female with history of CAD status post CABG, diastolic CHF, chronic kidney disease stage III, diabetes mellitus type 2, depression, history of DVT, history of atrial fibrillation presents to the [facility] ER after patient has been having persistent watery diarrhea and abdominal pain for the last 1 week.    Of note, patient was recently held in the [facility] emergency department from 3/19 until 3/28 for suicidal ideation.  Patient was rounded on by psychiatry daily and eventually discharged on 3/28.  Upon evaluation in the emergency department CT imaging of the abdomen pelvis revealed no obvious cause of her symptoms.  Patient did exhibit  worsening renal function with clinical evidence of dehydration and therefore the hospitalist group was called to assess the patient for admission to the hospital.  Patient was admitted to hospital service and placed on intravenous fluids.  Within 24 hours of hospitalization patient's diarrhea had completely resolved however patient continued to complain of severe abdominal pain.  Urinalysis was found to be abnormal and therefore patient was placed on empiric intravenous antibiotics with intravenous ceftriaxone.  Urine culture eventually ended up growing out Enterobacter cloacae and Proteus mirabilis and antibiotic regimen was switched to intravenous cefepime.  Over the course the hospitalization patient continued to exhibit extremely depressed mood.  Patient was repeatedly tearful and began to voice suicidal ideation with plan of overdosing on her medications as soon as she was discharged.  Patient would not contract for safety but was willing to remain in the hospital and therefore IVC was not initiated.  Psychiatry consultation was  requested.   Assessment & Plan Suicidal ideation Patient complaining of depressed mood with suicidal ideation. Patient voices plan to kill herself by overdosing on medications as soon as she is discharged. Patient will not contract for safety however patient is willing to remain in the hospital therefore IVC has not been placed Order for psychiatry consultation placed Suicide precautions, will order sitter. Of note, patient was [facility] from [facility]/19 until 3/28 for suicidal ideations.  Patient was rounded on daily by psychiatry until discharge. Acute cystitis without hematuria Urine cultures growing out Proteus mirabilis and Enterobacter cloacae  Antibiotics switched from intravenous ceftriaxone initiated on 4/4 to intravenous cefepime.   Patient continuing to complain of severe lower abdominal pain, hopefully pain will improve with appropriate intravenous antibiotic therapy.  CT imaging of abdomen and pelvis performed on admission unremarkable Acute renal failure superimposed on stage 3a chronic kidney disease ([name]) Creatinine now improved. Strict input and output monitoring Monitoring renal function and electrolytes with serial chemistries Avoiding nephrotoxic agents if at all possible  Hypokalemia Resolved Monitoring potassium levels with serial chemistries.  Acute diarrhea Diarrhea resolved Paroxysmal atrial fibrillation ([name]) Rate controlled and currently normal sinus rhythm Continue home regimen of Eliquis Continue home regimen of metoprolol CAD, multiple vessel s/p CABG x 3 Patient is currently chest pain free Monitoring patient on telemetry Continue home regimen of antiplatelet therapy, lipid lowering therapy and AV nodal blocking therapy  Essential hypertension Continue metoprolol PRN intravenous antihypertensives for excessively elevated blood pressure  Type 2 diabetes mellitus with mild nonproliferative diabetic retinopathy  without macular edema, bilateral ([name]) Patient been placed on Accu-Cheks before every meal and nightly with sliding scale insulin Holding home regimen  of hypoglycemics Hemoglobin A1C 8.7% suggesting poor outpatient management. Diabetic Diet  Mixed diabetic hyperlipidemia associated with type 2 diabetes mellitus ([name]) Continuing home regimen of lipid lowering therapy.   Subjective:  Patient is continuing to complain of abdominal pain, severe in intensity, lower abdominal in location, nonradiating and sharp in quality associated with dysuria.     Physical Exam:  Vitals:   06/14/23 1955 06/15/23 0442 06/15/23 1135 06/15/23 2007  BP: 139/68 (!) 155/79 139/78 122/70  Pulse: 62 65 65 69  Resp: 16 16  18   Temp: (!) 97.5 F (36.4 C) 97.9 F (36.6 C) 99.2 F (37.3 C) 98.3 F (36.8 C)  TempSrc: Oral Oral Oral   SpO2: 99% 99% 100% 99%  Weight:      Height:        Constitutional: Awake alert and oriented x3, patient is in distress due to lower abdominal pain. Skin: no rashes, no lesions, poor skin turgor noted. Eyes: Pupils are equally reactive to light.  No evidence of scleral icterus or conjunctival pallor.  ENMT: Dry mucous membranes noted.  Posterior pharynx clear of any exudate or lesions.   Respiratory: clear to auscultation bilaterally, no wheezing, no crackles. Normal respiratory effort. No accessory muscle use.  Cardiovascular: Regular rate and rhythm, no murmurs / rubs / gallops. No extremity edema. 2+ pedal pulses. No carotid bruits.  Abdomen: Significant lower abdominal tenderness.  Abdomen is soft.  No evidence of intra-abdominal masses.  Positive bowel sounds noted in all quadrants.   Musculoskeletal: No joint deformity upper and lower extremities. Good ROM, no contractures. Normal muscle tone.  Psych: Patient complaining of depressed mood "saying everything is "terrible."  Patient reports suicidal ideation and states she will overdose on medications if discharged from the  hospital.   Data Reviewed:  I have personally reviewed and interpreted labs, imaging.  Significant findings are   CBC: Recent Labs  Lab 06/11/23 2006 06/12/23 0535 06/13/23 0531 06/14/23 0543 06/15/23 0535  WBC 9.6 6.5 7.1 5.9 6.9  NEUTROABS 7.5 3.9 4.2 3.5 4.2  HGB 13.7 10.6* 9.7* 10.5* 11.2*  HCT 41.2 31.9* 29.7* 32.5* 34.5*  MCV 87.1 88.1 89.7 90.3 88.0  PLT 265 234 210 194 222   Basic Metabolic Panel: Recent Labs  Lab 06/11/23 2006 06/12/23 0535 06/13/23 0531 06/14/23 0543 06/15/23 0535  NA 135 133* 136 135 132*  K 4.4 3.2* 4.4 4.6 4.5  CL 103 108 110 107 104  CO2 16* 19* 20* 19* 18*  GLUCOSE 339* 193* 136* 170* 206*  BUN 28* 23 21 18 18   CREATININE 1.82* 1.43* 1.44* 1.26* 1.07*  CALCIUM 9.9 8.0* 8.7* 8.7* 9.0  MG  --   --  1.5* 1.7  --   PHOS  --   --   --   --  2.8   GFR: Estimated Creatinine Clearance: 34.7 mL/min (A) (by C-G formula based on SCr of 1.07 mg/dL (H)). Liver Function Tests: Recent Labs  Lab 06/11/23 2006 06/12/23 0535 06/13/23 0531 06/14/23 0543 06/15/23 0535  AST 20 23 13* 11*  --   ALT 13 13 11 8   --   ALKPHOS 91 63 61 55  --   BILITOT 1.4* 0.6 0.4 0.6  --   PROT 9.1* 6.3* 5.7* 6.1*  --   ALBUMIN 4.5 3.3* 3.0* 3.0* 3.2*    Code Status:  Full code.  Code status decision has been confirmed with: patient   Severity of Illness:  The appropriate patient status for this patient  is OBSERVATION. Observation status is judged to be reasonable and necessary in order to provide the required intensity of service to ensure the patient's safety. The patient's presenting symptoms, physical exam findings, and initial radiographic and laboratory data in the context of their medical condition is felt to place them at decreased risk for further clinical deterioration. Furthermore, it is anticipated that the patient will be medically stable for discharge from the hospital within 2 midnights of admission.   Time spent:  50  minutes  Author:  Marinda Elk MD  06/15/2023 11:52 PM

## 2023-06-16 DIAGNOSIS — I480 Paroxysmal atrial fibrillation: Secondary | ICD-10-CM | POA: Diagnosis not present

## 2023-06-16 DIAGNOSIS — I69911 Memory deficit following unspecified cerebrovascular disease: Secondary | ICD-10-CM | POA: Diagnosis not present

## 2023-06-16 DIAGNOSIS — Z79899 Other long term (current) drug therapy: Secondary | ICD-10-CM | POA: Diagnosis not present

## 2023-06-16 DIAGNOSIS — E782 Mixed hyperlipidemia: Secondary | ICD-10-CM | POA: Diagnosis not present

## 2023-06-16 DIAGNOSIS — E1169 Type 2 diabetes mellitus with other specified complication: Secondary | ICD-10-CM | POA: Diagnosis not present

## 2023-06-16 DIAGNOSIS — Z794 Long term (current) use of insulin: Secondary | ICD-10-CM | POA: Diagnosis not present

## 2023-06-16 DIAGNOSIS — N179 Acute kidney failure, unspecified: Secondary | ICD-10-CM | POA: Diagnosis not present

## 2023-06-16 DIAGNOSIS — E113293 Type 2 diabetes mellitus with mild nonproliferative diabetic retinopathy without macular edema, bilateral: Secondary | ICD-10-CM | POA: Diagnosis not present

## 2023-06-16 DIAGNOSIS — F329 Major depressive disorder, single episode, unspecified: Secondary | ICD-10-CM

## 2023-06-16 DIAGNOSIS — Z951 Presence of aortocoronary bypass graft: Secondary | ICD-10-CM | POA: Diagnosis not present

## 2023-06-16 DIAGNOSIS — K219 Gastro-esophageal reflux disease without esophagitis: Secondary | ICD-10-CM | POA: Diagnosis not present

## 2023-06-16 DIAGNOSIS — N1832 Chronic kidney disease, stage 3b: Secondary | ICD-10-CM | POA: Diagnosis not present

## 2023-06-16 DIAGNOSIS — E1165 Type 2 diabetes mellitus with hyperglycemia: Secondary | ICD-10-CM | POA: Diagnosis not present

## 2023-06-16 DIAGNOSIS — N3000 Acute cystitis without hematuria: Secondary | ICD-10-CM | POA: Diagnosis not present

## 2023-06-16 DIAGNOSIS — I130 Hypertensive heart and chronic kidney disease with heart failure and stage 1 through stage 4 chronic kidney disease, or unspecified chronic kidney disease: Secondary | ICD-10-CM | POA: Diagnosis not present

## 2023-06-16 DIAGNOSIS — Z1152 Encounter for screening for COVID-19: Secondary | ICD-10-CM | POA: Diagnosis not present

## 2023-06-16 DIAGNOSIS — E876 Hypokalemia: Secondary | ICD-10-CM | POA: Diagnosis not present

## 2023-06-16 DIAGNOSIS — K5900 Constipation, unspecified: Secondary | ICD-10-CM | POA: Diagnosis not present

## 2023-06-16 DIAGNOSIS — I5030 Unspecified diastolic (congestive) heart failure: Secondary | ICD-10-CM | POA: Diagnosis not present

## 2023-06-16 DIAGNOSIS — I2510 Atherosclerotic heart disease of native coronary artery without angina pectoris: Secondary | ICD-10-CM | POA: Diagnosis not present

## 2023-06-16 DIAGNOSIS — E1122 Type 2 diabetes mellitus with diabetic chronic kidney disease: Secondary | ICD-10-CM | POA: Diagnosis not present

## 2023-06-16 DIAGNOSIS — R45851 Suicidal ideations: Secondary | ICD-10-CM | POA: Diagnosis not present

## 2023-06-16 LAB — CBC WITH DIFFERENTIAL/PLATELET
Abs Immature Granulocytes: 0.06 10*3/uL (ref 0.00–0.07)
Basophils Absolute: 0.1 10*3/uL (ref 0.0–0.1)
Basophils Relative: 1 %
Eosinophils Absolute: 0.4 10*3/uL (ref 0.0–0.5)
Eosinophils Relative: 6 %
HCT: 35.7 % — ABNORMAL LOW (ref 36.0–46.0)
Hemoglobin: 11.4 g/dL — ABNORMAL LOW (ref 12.0–15.0)
Immature Granulocytes: 1 %
Lymphocytes Relative: 27 %
Lymphs Abs: 1.9 10*3/uL (ref 0.7–4.0)
MCH: 28.2 pg (ref 26.0–34.0)
MCHC: 31.9 g/dL (ref 30.0–36.0)
MCV: 88.4 fL (ref 80.0–100.0)
Monocytes Absolute: 0.6 10*3/uL (ref 0.1–1.0)
Monocytes Relative: 9 %
Neutro Abs: 4 10*3/uL (ref 1.7–7.7)
Neutrophils Relative %: 56 %
Platelets: 254 10*3/uL (ref 150–400)
RBC: 4.04 MIL/uL (ref 3.87–5.11)
RDW: 14.2 % (ref 11.5–15.5)
WBC: 7.1 10*3/uL (ref 4.0–10.5)
nRBC: 0 % (ref 0.0–0.2)

## 2023-06-16 LAB — COMPREHENSIVE METABOLIC PANEL WITH GFR
ALT: 10 U/L (ref 0–44)
AST: 13 U/L — ABNORMAL LOW (ref 15–41)
Albumin: 3.3 g/dL — ABNORMAL LOW (ref 3.5–5.0)
Alkaline Phosphatase: 61 U/L (ref 38–126)
Anion gap: 8 (ref 5–15)
BUN: 23 mg/dL (ref 8–23)
CO2: 22 mmol/L (ref 22–32)
Calcium: 9.1 mg/dL (ref 8.9–10.3)
Chloride: 104 mmol/L (ref 98–111)
Creatinine, Ser: 1.46 mg/dL — ABNORMAL HIGH (ref 0.44–1.00)
GFR, Estimated: 36 mL/min — ABNORMAL LOW (ref >60)
Glucose, Bld: 225 mg/dL — ABNORMAL HIGH (ref 70–99)
Potassium: 4.8 mmol/L (ref 3.5–5.1)
Sodium: 134 mmol/L — ABNORMAL LOW (ref 135–145)
Total Bilirubin: 0.5 mg/dL (ref 0.0–1.2)
Total Protein: 7 g/dL (ref 6.5–8.1)

## 2023-06-16 LAB — GLUCOSE, CAPILLARY
Glucose-Capillary: 186 mg/dL — ABNORMAL HIGH (ref 70–99)
Glucose-Capillary: 245 mg/dL — ABNORMAL HIGH (ref 70–99)
Glucose-Capillary: 230 mg/dL — ABNORMAL HIGH (ref 70–99)
Glucose-Capillary: 130 mg/dL — ABNORMAL HIGH (ref 70–99)
Glucose-Capillary: 172 mg/dL — ABNORMAL HIGH (ref 70–99)

## 2023-06-16 LAB — CULTURE, BLOOD (ROUTINE X 2)
Culture: NO GROWTH
Culture: NO GROWTH

## 2023-06-16 MED ORDER — SULFAMETHOXAZOLE-TRIMETHOPRIM 400-80 MG PO TABS
1.0000 | ORAL_TABLET | Freq: Two times a day (BID) | ORAL | Status: AC
  Administered 2023-06-16 – 2023-06-19 (×8): 1 via ORAL
  Filled 2023-06-16 (×8): qty 1

## 2023-06-16 MED ORDER — DULOXETINE HCL 30 MG PO CPEP
60.0000 mg | ORAL_CAPSULE | Freq: Every day | ORAL | Status: DC
  Administered 2023-06-17 – 2023-06-23 (×7): 60 mg via ORAL
  Filled 2023-06-16 (×7): qty 2

## 2023-06-16 MED ORDER — DULOXETINE HCL 30 MG PO CPEP
30.0000 mg | ORAL_CAPSULE | Freq: Every day | ORAL | Status: DC
  Administered 2023-06-17 – 2023-06-24 (×8): 30 mg via ORAL
  Filled 2023-06-16 (×8): qty 1

## 2023-06-16 NOTE — Progress Notes (Signed)
 Progress Note   Patient: Maria Fox ZHY:[id number] DOB: Jan 18, 1946 DOA: 06/11/2023     0 DOS: the patient was seen and examined on 06/16/2023   Brief hospital course: 78yo with h/o CAD s/p CABG, chronic HFpEF, stage 3 CKD, DM, depression, and afib who presented on 4/2 with diarrhea and abdominal pain.  She was previously held in the ER from 3/19-28 for SI with daily psychiatry input.  CT negative but noted to have worsening renal function, resolved with IVF.  +UTI with Enterobacter, Proteus, treated with Ceftriaxone -> Cefepime.  Remains with SI, has Recruitment consultant, psych consulted.  Will dc to inpatient psych.  Assessment and Plan:  Suicidal ideation Patient complaining of depressed mood with suicidal ideation Patient voices plan to kill herself by overdosing on medications as soon as she is discharged Patient will not contract for safety however patient is willing to remain in the hospital therefore IVC has not been placed Order for psychiatry consultation placed Suicide precautions, will order sitter Of note, patient was [facility] the [facility] from 3/19 until 3/28 for suicidal ideations.  Patient was rounded on daily by psychiatry until discharge. Awaiting inpatient [facility] placement - [facility] vs. Other She is medically stable for dc as of 4/7   Acute cystitis without hematuria Urine cultures growing out Proteus mirabilis and Enterobacter cloacae  Ceftriaxone -> Cefepime -> PO Bactrim Abdominal pain is still present, hopefully pain will improve with appropriate antibiotic therapy CT imaging of abdomen and pelvis performed on admission unremarkable   AKI superimposed on stage 3b chronic kidney disease  Baseline creatinine 1.3-1.5, was 1.8 on presentation Improved to 1.46 today Avoid nephrotoxic agents if possible   Acute diarrhea Diarrhea resolved   Paroxysmal atrial fibrillation Rate controlled and currently normal sinus rhythm Continue home regimen of  Eliquis Continue home regimen of metoprolol for rate control   CAD, multiple vessel s/p CABG x 3 Patient is currently chest pain free Continue Plavix   Essential hypertension Continue metoprolol PRN intravenous antihypertensives for excessively elevated blood pressure   Type 2 diabetes mellitus with mild nonproliferative diabetic retinopathy without macular edema, bilateral Patient been placed on Accu-Cheks before every meal and nightly with sliding scale insulin Holding home regimen of hypoglycemics (glipizide, metformin) Hemoglobin A1C 8.7% suggesting poor outpatient control Diabetic Diet   Mixed diabetic hyperlipidemia associated with type 2 diabetes mellitus Continue atorvastatin  Glaucoma Continue Cosopt, latanoprost       Consultants: Psychiatry PT   Procedures: None   Antibiotics: Ceftriaxone 4/4-6 Cefepime 4/6-       Subjective: Reports ongoing depression with SI. Also complains of headache and abdominal pain.  Eating lunch without difficulty at the time of my evaluation.   Objective: Vitals:   06/16/23 0513 06/16/23 1355  BP: 124/74 110/70  Pulse: 72 71  Resp: 18 18  Temp: 97.6 F (36.4 C) 97.6 F (36.4 C)  SpO2: 98% 99%    Intake/Output Summary (Last 24 hours) at 06/16/2023 1357 Last data filed at 06/16/2023 1215 Gross per 24 hour  Intake 1000 ml  Output --  Net 1000 ml   Filed Weights   06/12/23 0333  Weight: 60 kg    Exam:  General:  Appears calm and comfortable and is in NAD, eating Eyes:  EOMI, normal lids, iris ENT:  grossly normal hearing, lips & tongue, mmm Cardiovascular:  RRR, no m/r/g. No LE edema.  Respiratory:   CTA bilaterally with no wheezes/rales/rhonchi.  Normal respiratory effort. Abdomen:  soft, NT, ND Skin:  no rash or induration seen on limited exam Musculoskeletal:  grossly normal tone BUE/BLE, good ROM, no bony abnormality Psychiatric:  blunted mood and affect, speech fluent and appropriate, AOx3 Neurologic:  CN  2-12 grossly intact, moves all extremities in coordinated fashion  Data Reviewed: I have reviewed the patient's lab results since admission.  Pertinent labs for today include:   Na++ 134, not clinically relevant Glucose 225 BUN 23/Creatinine 1.46/GFR 36 WBC 7.1 Hgb 11.4     Family Communication: None present  Disposition: Status is: Observation The patient remains OBS appropriate and will d/c before 2 midnights.     Time spent: 50 minutes  Unresulted Labs (From admission, onward)    None        Author: Jonah Blue, MD 06/16/2023 1:57 PM  For on call review [email or web address].
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
## 2023-06-16 NOTE — Plan of Care (Signed)
  Problem: Clinical Measurements: Goal: Will remain free from infection Outcome: Progressing   Problem: Activity: Goal: Risk for activity intolerance will decrease Outcome: Progressing   Problem: Nutrition: Goal: Adequate nutrition will be maintained Outcome: Progressing   Problem: Coping: Goal: Level of anxiety will decrease Outcome: Progressing   Problem: Elimination: Goal: Will not experience complications related to bowel motility Outcome: Progressing   Problem: Pain Managment: Goal: General experience of comfort will improve and/or be controlled Outcome: Progressing   Problem: Safety: Goal: Ability to remain free from injury will improve Outcome: Progressing   Problem: Education: Goal: Ability to describe self-care measures that may prevent or decrease complications (Diabetes Survival Skills Education) will improve Outcome: Progressing   Problem: Coping: Goal: Ability to adjust to condition or change in health will improve Outcome: Progressing   Problem: Metabolic: Goal: Ability to maintain appropriate glucose levels will improve Outcome: Progressing   Problem: Tissue Perfusion: Goal: Adequacy of tissue perfusion will improve Outcome: Progressing

## 2023-06-16 NOTE — Progress Notes (Addendum)
 Pt is voicing out this afternoon that she wants to kill herself when she goes home. Suicide risk assessment done with charge nurse, Elijah Birk and pt contracting safety now. Greggory Stallion, MD aware thru secure chat. Will continue to monitor.

## 2023-06-16 NOTE — Plan of Care (Signed)

## 2023-06-16 NOTE — Inpatient Diabetes Management (Signed)
 Inpatient Diabetes Program Recommendations  AACE/ADA: New Consensus Statement on Inpatient Glycemic Control (2015)  Target Ranges:  Prepandial:   less than 140 mg/dL      Peak postprandial:   less than 180 mg/dL (1-2 hours)      Critically ill patients:  140 - 180 mg/dL   Lab Results  Component Value Date   GLUCAP 245 (H) 06/16/2023   HGBA1C 8.7 (H) 06/13/2023    Review of Glycemic Control  Latest Reference Range & Units 06/15/23 07:30 06/15/23 11:33 06/15/23 16:35 06/15/23 20:06 06/16/23 03:31 06/16/23 07:37   Diabetes history: DM 2 Outpatient Diabetes medications: glipizide 10 mg bid, metformin 500 mg Daily Current orders for Inpatient glycemic control:  Novolog 0-15 units tid and hs  A1c 8.7% on 4/4  Inpatient Diabetes Program Recommendations:    -   consider adding Novolog 2 units tid meal coverage if eating >50% of meals  Thanks,  Christena Deem RN, MSN, BC-ADM Inpatient Diabetes Coordinator Team Pager [phone] (8a-5p)

## 2023-06-16 NOTE — TOC Progression Note (Signed)
 Transition of Care [facility]) - Progression Note    Patient Details  Name: Maria Fox MRN: [id number] Date of Birth: 06-16-1945  Transition [facility]) CM/SW Contact  Otelia Santee, LCSW Phone Number: 06/16/2023, 2:45 PM  Clinical Narrative:    Pt recommended for inpatient psychiatric placement due to active suicidality. [facility] and [facility] unable to extend bed offers. Referrals have been faxed out for placement. TOC will continue to follow.         Expected Discharge Plan and Services                                               Social Determinants of Health (SDOH) Interventions SDOH Screenings   Food Insecurity: No Food Insecurity (06/11/2023)  Housing: Low Risk  (06/11/2023)  Transportation Needs: No Transportation Needs (06/11/2023)  Utilities: Not At Risk (06/11/2023)  Alcohol Screen: Low Risk  (03/17/2023)  Depression (PHQ2-9): High Risk (12/27/2022)  Financial Resource Strain: Low Risk  (12/03/2022)  Physical Activity: Inactive (12/03/2022)  Social Connections: Moderately Integrated (03/17/2023)  Stress: No Stress Concern Present (12/03/2022)  Tobacco Use: Low Risk  (06/12/2023)  Health Literacy: Adequate Health Literacy (12/03/2022)    Readmission Risk Interventions    07/25/2022   10:12 AM 02/26/2022   12:55 PM  Readmission Risk Prevention Plan  Transportation Screening Complete Complete  PCP or Specialist Appt within 5-7 Days Complete   Home Care Screening Complete   Medication Review (RN CM) Complete   HRI or Home Care Consult  Complete  Social Work Consult for Recovery Care Planning/Counseling  Complete  Palliative Care Screening  Not Applicable

## 2023-06-16 NOTE — Telephone Encounter (Signed)
 ERx

## 2023-06-17 DIAGNOSIS — E876 Hypokalemia: Secondary | ICD-10-CM | POA: Diagnosis not present

## 2023-06-17 DIAGNOSIS — I480 Paroxysmal atrial fibrillation: Secondary | ICD-10-CM | POA: Diagnosis not present

## 2023-06-17 DIAGNOSIS — K219 Gastro-esophageal reflux disease without esophagitis: Secondary | ICD-10-CM | POA: Diagnosis not present

## 2023-06-17 DIAGNOSIS — K5900 Constipation, unspecified: Secondary | ICD-10-CM | POA: Diagnosis not present

## 2023-06-17 DIAGNOSIS — E782 Mixed hyperlipidemia: Secondary | ICD-10-CM | POA: Diagnosis not present

## 2023-06-17 DIAGNOSIS — I130 Hypertensive heart and chronic kidney disease with heart failure and stage 1 through stage 4 chronic kidney disease, or unspecified chronic kidney disease: Secondary | ICD-10-CM | POA: Diagnosis not present

## 2023-06-17 DIAGNOSIS — Z1152 Encounter for screening for COVID-19: Secondary | ICD-10-CM | POA: Diagnosis not present

## 2023-06-17 DIAGNOSIS — E1169 Type 2 diabetes mellitus with other specified complication: Secondary | ICD-10-CM | POA: Diagnosis not present

## 2023-06-17 DIAGNOSIS — E1165 Type 2 diabetes mellitus with hyperglycemia: Secondary | ICD-10-CM | POA: Diagnosis not present

## 2023-06-17 DIAGNOSIS — Z79899 Other long term (current) drug therapy: Secondary | ICD-10-CM | POA: Diagnosis not present

## 2023-06-17 DIAGNOSIS — I69911 Memory deficit following unspecified cerebrovascular disease: Secondary | ICD-10-CM | POA: Diagnosis not present

## 2023-06-17 DIAGNOSIS — I5030 Unspecified diastolic (congestive) heart failure: Secondary | ICD-10-CM | POA: Diagnosis not present

## 2023-06-17 DIAGNOSIS — Z951 Presence of aortocoronary bypass graft: Secondary | ICD-10-CM | POA: Diagnosis not present

## 2023-06-17 DIAGNOSIS — F329 Major depressive disorder, single episode, unspecified: Secondary | ICD-10-CM | POA: Diagnosis not present

## 2023-06-17 DIAGNOSIS — N179 Acute kidney failure, unspecified: Secondary | ICD-10-CM | POA: Diagnosis not present

## 2023-06-17 DIAGNOSIS — E1122 Type 2 diabetes mellitus with diabetic chronic kidney disease: Secondary | ICD-10-CM | POA: Diagnosis not present

## 2023-06-17 DIAGNOSIS — E113293 Type 2 diabetes mellitus with mild nonproliferative diabetic retinopathy without macular edema, bilateral: Secondary | ICD-10-CM | POA: Diagnosis not present

## 2023-06-17 DIAGNOSIS — I2510 Atherosclerotic heart disease of native coronary artery without angina pectoris: Secondary | ICD-10-CM | POA: Diagnosis not present

## 2023-06-17 DIAGNOSIS — R45851 Suicidal ideations: Secondary | ICD-10-CM | POA: Diagnosis not present

## 2023-06-17 DIAGNOSIS — N3000 Acute cystitis without hematuria: Secondary | ICD-10-CM | POA: Diagnosis not present

## 2023-06-17 DIAGNOSIS — Z794 Long term (current) use of insulin: Secondary | ICD-10-CM | POA: Diagnosis not present

## 2023-06-17 DIAGNOSIS — N1832 Chronic kidney disease, stage 3b: Secondary | ICD-10-CM | POA: Diagnosis not present

## 2023-06-17 LAB — GLUCOSE, CAPILLARY
Glucose-Capillary: 233 mg/dL — ABNORMAL HIGH (ref 70–99)
Glucose-Capillary: 365 mg/dL — ABNORMAL HIGH (ref 70–99)
Glucose-Capillary: 61 mg/dL — ABNORMAL LOW (ref 70–99)
Glucose-Capillary: 87 mg/dL (ref 70–99)
Glucose-Capillary: 164 mg/dL — ABNORMAL HIGH (ref 70–99)

## 2023-06-17 NOTE — Plan of Care (Signed)
   Problem: Activity: Goal: Risk for activity intolerance will decrease Outcome: Progressing   Problem: Nutrition: Goal: Adequate nutrition will be maintained Outcome: Progressing   Problem: Coping: Goal: Level of anxiety will decrease Outcome: Progressing   Problem: Safety: Goal: Ability to remain free from injury will improve Outcome: Progressing   Problem: Skin Integrity: Goal: Risk for impaired skin integrity will decrease Outcome: Progressing

## 2023-06-17 NOTE — Plan of Care (Signed)

## 2023-06-17 NOTE — Inpatient Diabetes Management (Signed)
 Inpatient Diabetes Program Recommendations  AACE/ADA: New Consensus Statement on Inpatient Glycemic Control (2015)  Target Ranges:  Prepandial:   less than 140 mg/dL      Peak postprandial:   less than 180 mg/dL (1-2 hours)      Critically ill patients:  140 - 180 mg/dL   Lab Results  Component Value Date   GLUCAP 365 (H) 06/17/2023   HGBA1C 8.7 (H) 06/13/2023    Review of Glycemic Control  Latest Reference Range & Units 06/16/23 07:37 06/16/23 12:03 06/16/23 17:01 06/16/23 20:40 06/17/23 07:28 06/17/23 11:51  Glucose-Capillary 70 - 99 mg/dL 350 (H) 093 (H) 818 (H) 172 (H) 233 (H) 365 (H)   Diabetes history: DM 2 Outpatient Diabetes medications: glipizide 10 mg bid, metformin 500 mg Daily Current orders for Inpatient glycemic control:  Novolog 0-15 units tid and hs  A1c 8.7% on 4/4  Inpatient Diabetes Program Recommendations:    -   consider adding Novolog 4 units tid meal coverage if eating >50% of meals  Thanks,  Christena Deem RN, MSN, BC-ADM Inpatient Diabetes Coordinator Team Pager [phone] (8a-5p)

## 2023-06-17 NOTE — TOC Progression Note (Signed)
 Transition of Care [facility]) - Progression Note    Patient Details  Name: Maria Fox MRN: [id number] Date of Birth: 03-01-1946  [facility]) CM/SW Contact  Otelia Santee, LCSW Phone Number: 06/17/2023, 3:05 PM  Clinical Narrative:    Pt has no current offers for psych placement.   CSW has contacted the following facilities: -[facility]- declined due to medical complexity  -[facility]- No answer -[facility]- no bed availability  -[address]- Declined due to being medically unstable  -[facility]- Left message -[facility]- Voicemail left -[facility] bed placement- Voicemail left -[facility]- Denied due to medical acuity -[name]- denied  TOC will continue to seek appropriate placement.          Expected Discharge Plan and Services                                               Social Determinants of Health (SDOH) Interventions SDOH Screenings   Food Insecurity: No Food Insecurity (06/11/2023)  Housing: Low Risk  (06/11/2023)  Transportation Needs: No Transportation Needs (06/11/2023)  Utilities: Not At Risk (06/11/2023)  Alcohol Screen: Low Risk  (03/17/2023)  Depression (PHQ2-9): High Risk (12/27/2022)  Financial Resource Strain: Low Risk  (12/03/2022)  Physical Activity: Inactive (12/03/2022)  Social Connections: Moderately Integrated (03/17/2023)  Stress: No Stress Concern Present (12/03/2022)  Tobacco Use: Low Risk  (06/12/2023)  Health Literacy: Adequate Health Literacy (12/03/2022)    Readmission Risk Interventions    07/25/2022   10:12 AM 02/26/2022   12:55 PM  Readmission Risk Prevention Plan  Transportation Screening Complete Complete  PCP or Specialist Appt within 5-7 Days Complete   Home Care Screening Complete   Medication Review (RN CM) Complete   HRI or Home Care Consult  Complete  Social Work Consult for Recovery Care Planning/Counseling  Complete  Palliative Care Screening  Not Applicable

## 2023-06-17 NOTE — Progress Notes (Signed)
 Progress Note   Patient: Maria Fox ZOX:[id number] DOB: 11-Jun-1945 DOA: 06/11/2023     0 DOS: the patient was seen and examined on 06/17/2023   Brief hospital course: 78yo with h/o CAD s/p CABG, chronic HFpEF, stage 3 CKD, DM, depression, and afib who presented on 4/2 with diarrhea and abdominal pain.  She was previously held in the ER from 3/19-28 for SI with daily psychiatry input.  CT negative but noted to have worsening renal function, resolved with IVF.  +UTI with Enterobacter, Proteus, treated with Ceftriaxone -> Cefepime.  Remains with SI, has Recruitment consultant, psych consulted.  Will dc to inpatient psych.  Assessment and Plan:  Suicidal ideation Patient complaining of depressed mood with suicidal ideation Patient voices plan to kill herself by overdosing on medications as soon as she is discharged Patient will not contract for safety however patient is willing to remain in the hospital therefore IVC has not been placed Order for psychiatry consultation placed Suicide precautions, will order sitter Of note, patient was recently held in the less along emergency department from 3/19 until 3/28 for suicidal ideations.  Patient was rounded on daily by psychiatry until discharge. Awaiting inpatient [facility] placement - [facility] vs. Other She is medically stable for dc as of 4/7   Acute cystitis without hematuria Urine cultures growing out Proteus mirabilis and Enterobacter cloacae  Ceftriaxone -> Cefepime -> PO Bactrim to complete 7 days Abdominal pain is still present, hopefully pain will improve with appropriate antibiotic therapy CT imaging of abdomen and pelvis performed on admission unremarkable   AKI superimposed on stage 3b chronic kidney disease  Baseline creatinine 1.3-1.5, was 1.8 on presentation Improved to 1.46 today Avoid nephrotoxic agents if possible   Acute diarrhea Diarrhea resolved   Paroxysmal atrial fibrillation Rate controlled and currently normal sinus rhythm Continue  home regimen of Eliquis Continue home regimen of metoprolol for rate control   CAD, multiple vessel s/p CABG x 3 Patient is currently chest pain free Continue Plavix   Essential hypertension Continue metoprolol PRN intravenous antihypertensives for excessively elevated blood pressure   Type 2 diabetes mellitus with mild nonproliferative diabetic retinopathy without macular edema, bilateral Patient been placed on Accu-Cheks before every meal and nightly with sliding scale insulin Holding home regimen of hypoglycemics (glipizide, metformin) Hemoglobin A1C 8.7% suggesting poor outpatient control Diabetic Diet   Mixed diabetic hyperlipidemia associated with type 2 diabetes mellitus Continue atorvastatin   Glaucoma Continue Cosopt, latanoprost       Consultants: Psychiatry PT   Procedures: None   Antibiotics: Ceftriaxone 4/4-6 Cefepime 4/6-7 Bactrim 4/7-10      Subjective: Still with abdominal pain and headache.  Still sad.   Objective: Vitals:   06/16/23 1942 06/17/23 0601  BP: (!) 114/52 125/79  Pulse: (!) 45 74  Resp: 16 16  Temp: 97.7 F (36.5 C) (!) 97.5 F (36.4 C)  SpO2: 96% 99%    Intake/Output Summary (Last 24 hours) at 06/17/2023 0838 Last data filed at 06/16/2023 1807 Gross per 24 hour  Intake 720 ml  Output --  Net 720 ml   Filed Weights   06/12/23 0333  Weight: 60 kg    Exam:  General:  Appears calm and comfortable and is in NAD Eyes:  EOMI, normal lids, iris ENT:  grossly normal hearing, lips & tongue, mmm Cardiovascular:  RRR. No LE edema.  Respiratory:   CTA bilaterally with no wheezes/rales/rhonchi.  Normal respiratory effort. Abdomen:  soft, NT, ND Skin:  no rash or  induration seen on limited exam Musculoskeletal:  grossly normal tone BUE/BLE, good ROM, no bony abnormality Psychiatric:  blunted mood and affect, speech fluent and appropriate, AOx3 Neurologic:  CN 2-12 grossly intact, moves all extremities in coordinated  fashion  Data Reviewed: I have reviewed the patient's lab results since admission.  Pertinent labs for today include:   None     Family Communication: None present  Disposition: Status is: Observation The patient remains OBS appropriate and will d/c before 2 midnights.     Time spent: 35 minutes  Unresulted Labs (From admission, onward)    None        Author: Jonah Blue, MD 06/17/2023 8:38 AM  For on call review [email or web address].
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
## 2023-06-18 DIAGNOSIS — E113293 Type 2 diabetes mellitus with mild nonproliferative diabetic retinopathy without macular edema, bilateral: Secondary | ICD-10-CM | POA: Diagnosis not present

## 2023-06-18 DIAGNOSIS — Z951 Presence of aortocoronary bypass graft: Secondary | ICD-10-CM | POA: Diagnosis not present

## 2023-06-18 DIAGNOSIS — N1832 Chronic kidney disease, stage 3b: Secondary | ICD-10-CM | POA: Diagnosis not present

## 2023-06-18 DIAGNOSIS — I2510 Atherosclerotic heart disease of native coronary artery without angina pectoris: Secondary | ICD-10-CM | POA: Diagnosis not present

## 2023-06-18 DIAGNOSIS — Z794 Long term (current) use of insulin: Secondary | ICD-10-CM | POA: Diagnosis not present

## 2023-06-18 DIAGNOSIS — E876 Hypokalemia: Secondary | ICD-10-CM | POA: Diagnosis not present

## 2023-06-18 DIAGNOSIS — I480 Paroxysmal atrial fibrillation: Secondary | ICD-10-CM | POA: Diagnosis not present

## 2023-06-18 DIAGNOSIS — K219 Gastro-esophageal reflux disease without esophagitis: Secondary | ICD-10-CM | POA: Diagnosis not present

## 2023-06-18 DIAGNOSIS — E1122 Type 2 diabetes mellitus with diabetic chronic kidney disease: Secondary | ICD-10-CM | POA: Diagnosis not present

## 2023-06-18 DIAGNOSIS — R45851 Suicidal ideations: Secondary | ICD-10-CM | POA: Diagnosis not present

## 2023-06-18 DIAGNOSIS — E1169 Type 2 diabetes mellitus with other specified complication: Secondary | ICD-10-CM | POA: Diagnosis not present

## 2023-06-18 DIAGNOSIS — Z1152 Encounter for screening for COVID-19: Secondary | ICD-10-CM | POA: Diagnosis not present

## 2023-06-18 DIAGNOSIS — E782 Mixed hyperlipidemia: Secondary | ICD-10-CM | POA: Diagnosis not present

## 2023-06-18 DIAGNOSIS — I69911 Memory deficit following unspecified cerebrovascular disease: Secondary | ICD-10-CM | POA: Diagnosis not present

## 2023-06-18 DIAGNOSIS — I5030 Unspecified diastolic (congestive) heart failure: Secondary | ICD-10-CM | POA: Diagnosis not present

## 2023-06-18 DIAGNOSIS — N3000 Acute cystitis without hematuria: Secondary | ICD-10-CM | POA: Diagnosis not present

## 2023-06-18 DIAGNOSIS — I130 Hypertensive heart and chronic kidney disease with heart failure and stage 1 through stage 4 chronic kidney disease, or unspecified chronic kidney disease: Secondary | ICD-10-CM | POA: Diagnosis not present

## 2023-06-18 DIAGNOSIS — N179 Acute kidney failure, unspecified: Secondary | ICD-10-CM | POA: Diagnosis not present

## 2023-06-18 DIAGNOSIS — K5900 Constipation, unspecified: Secondary | ICD-10-CM | POA: Diagnosis not present

## 2023-06-18 DIAGNOSIS — E1165 Type 2 diabetes mellitus with hyperglycemia: Secondary | ICD-10-CM | POA: Diagnosis not present

## 2023-06-18 DIAGNOSIS — Z79899 Other long term (current) drug therapy: Secondary | ICD-10-CM | POA: Diagnosis not present

## 2023-06-18 LAB — GLUCOSE, CAPILLARY
Glucose-Capillary: 250 mg/dL — ABNORMAL HIGH (ref 70–99)
Glucose-Capillary: 388 mg/dL — ABNORMAL HIGH (ref 70–99)
Glucose-Capillary: 99 mg/dL (ref 70–99)
Glucose-Capillary: 214 mg/dL — ABNORMAL HIGH (ref 70–99)

## 2023-06-18 MED ORDER — POLYETHYLENE GLYCOL 3350 17 G PO PACK
17.0000 g | PACK | Freq: Every day | ORAL | Status: DC | PRN
  Administered 2023-06-21: 17 g via ORAL
  Filled 2023-06-18 (×2): qty 1

## 2023-06-18 MED ORDER — DOCUSATE SODIUM 100 MG PO CAPS
100.0000 mg | ORAL_CAPSULE | Freq: Two times a day (BID) | ORAL | Status: DC
Start: 2023-06-18 — End: 2023-06-24
  Administered 2023-06-18 – 2023-06-24 (×13): 100 mg via ORAL
  Filled 2023-06-18 (×13): qty 1

## 2023-06-18 NOTE — TOC Progression Note (Signed)
 Transition of Care [facility]) - Progression Note    Patient Details  Name: Maria Fox MRN: [id number] Date of Birth: November 09, 1945  Transition of Care [facility]) CM/SW Contact  Otelia Santee, LCSW Phone Number: 06/18/2023, 2:12 PM  Clinical Narrative:    Pt continues to have no bed offers for psychiatric placement.  Referrals have been resent out for review.         Expected Discharge Plan and Services                                               Social Determinants of Health (SDOH) Interventions SDOH Screenings   Food Insecurity: No Food Insecurity (06/11/2023)  Housing: Low Risk  (06/11/2023)  Transportation Needs: No Transportation Needs (06/11/2023)  Utilities: Not At Risk (06/11/2023)  Alcohol Screen: Low Risk  (03/17/2023)  Depression (PHQ2-9): High Risk (12/27/2022)  Financial Resource Strain: Low Risk  (12/03/2022)  Physical Activity: Inactive (12/03/2022)  Social Connections: Moderately Integrated (03/17/2023)  Stress: No Stress Concern Present (12/03/2022)  Tobacco Use: Low Risk  (06/12/2023)  Health Literacy: Adequate Health Literacy (12/03/2022)    Readmission Risk Interventions    07/25/2022   10:12 AM 02/26/2022   12:55 PM  Readmission Risk Prevention Plan  Transportation Screening Complete Complete  PCP or Specialist Appt within 5-7 Days Complete   Home Care Screening Complete   Medication Review (RN CM) Complete   HRI or Home Care Consult  Complete  Social Work Consult for Recovery Care Planning/Counseling  Complete  Palliative Care Screening  Not Applicable

## 2023-06-18 NOTE — Progress Notes (Signed)
  Progress Note   Patient: Maria Fox ZOX:[id number] DOB: 1946/01/18 DOA: 06/11/2023     0 DOS: the patient was seen and examined on 06/18/2023   Brief hospital course: 78yo with h/o CAD s/p CABG, chronic HFpEF, stage 3 CKD, DM, depression, and afib who presented on 4/2 with diarrhea and abdominal pain. She was previously held in the ER from 3/19-28 for SI with daily psychiatry input. CT negative but noted to have worsening renal function, resolved with IVF. +UTI with Enterobacter, Proteus, treated with Ceftriaxone -> Cefepime. Remains with SI, has Recruitment consultant, psych consulted. Will dc to inpatient psych.   Assessment and Plan: Suicidal ideation  - Appreciate psychiatry following along  - Cymbalta 30 mg PO daily  - Cymbalta 60 mg PO at bedtime   Acute cystitis  - Bactrim 400-80 mg PO q12   AKI superimposed on 3b CKD  - Stable   PAF - Eliquis 5 mg PO bid    CAD - Plavix 75 mg PO daily   HTN  - Imdur 15 mg PO daily  - Toprol XL 25 mg PO daily   DM2 - Novolog SS tid  HLD  - lipitor 40 mg PO at bedtime   Glaucoma  - Resume home eye drops   Subjective: Pt seen and examined at the bedside. This morning pt still reports feeling suicidal. She stated this morning that if she could she would take all of the pills in her bottle to harm herself. She continues on PO bactrim. Appreciate psych following along.  Physical Exam: Vitals:   06/17/23 1145 06/17/23 1924 06/18/23 0459 06/18/23 0857  BP: 103/65 (!) 109/49 137/72 136/66  Pulse: 65 60 69 67  Resp: 18 15 20    Temp: 97.6 F (36.4 C) 97.7 F (36.5 C) 97.9 F (36.6 C)   TempSrc: Oral     SpO2: 98% 99% 99%   Weight:      Height:       Physical Exam HENT:     Head: Normocephalic.  Cardiovascular:     Rate and Rhythm: Normal rate and regular rhythm.  Pulmonary:     Effort: Pulmonary effort is normal.  Abdominal:     General: Abdomen is flat.  Skin:    General: Skin is warm.  Psychiatric:        Mood and Affect: Mood  normal.     Disposition: Status is: Observation The patient remains OBS appropriate and will d/c before 2 midnights.  Planned Discharge Destination: Barriers to discharge: Suicidal ideations     Time spent: 35 minutes  Author: Baron Hamper , MD 06/18/2023 9:09 AM  For on call review [email or web address].

## 2023-06-18 NOTE — Plan of Care (Signed)
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
## 2023-06-19 DIAGNOSIS — I69911 Memory deficit following unspecified cerebrovascular disease: Secondary | ICD-10-CM | POA: Diagnosis not present

## 2023-06-19 DIAGNOSIS — E876 Hypokalemia: Secondary | ICD-10-CM | POA: Diagnosis not present

## 2023-06-19 DIAGNOSIS — R45851 Suicidal ideations: Secondary | ICD-10-CM | POA: Diagnosis not present

## 2023-06-19 DIAGNOSIS — F332 Major depressive disorder, recurrent severe without psychotic features: Secondary | ICD-10-CM | POA: Diagnosis not present

## 2023-06-19 DIAGNOSIS — Z951 Presence of aortocoronary bypass graft: Secondary | ICD-10-CM | POA: Diagnosis not present

## 2023-06-19 DIAGNOSIS — I5030 Unspecified diastolic (congestive) heart failure: Secondary | ICD-10-CM | POA: Diagnosis not present

## 2023-06-19 DIAGNOSIS — I2510 Atherosclerotic heart disease of native coronary artery without angina pectoris: Secondary | ICD-10-CM | POA: Diagnosis not present

## 2023-06-19 DIAGNOSIS — K219 Gastro-esophageal reflux disease without esophagitis: Secondary | ICD-10-CM | POA: Diagnosis not present

## 2023-06-19 DIAGNOSIS — E1165 Type 2 diabetes mellitus with hyperglycemia: Secondary | ICD-10-CM | POA: Diagnosis not present

## 2023-06-19 DIAGNOSIS — E113293 Type 2 diabetes mellitus with mild nonproliferative diabetic retinopathy without macular edema, bilateral: Secondary | ICD-10-CM | POA: Diagnosis not present

## 2023-06-19 DIAGNOSIS — E1122 Type 2 diabetes mellitus with diabetic chronic kidney disease: Secondary | ICD-10-CM | POA: Diagnosis not present

## 2023-06-19 DIAGNOSIS — I480 Paroxysmal atrial fibrillation: Secondary | ICD-10-CM | POA: Diagnosis not present

## 2023-06-19 DIAGNOSIS — E782 Mixed hyperlipidemia: Secondary | ICD-10-CM | POA: Diagnosis not present

## 2023-06-19 DIAGNOSIS — K5900 Constipation, unspecified: Secondary | ICD-10-CM | POA: Diagnosis not present

## 2023-06-19 DIAGNOSIS — Z79899 Other long term (current) drug therapy: Secondary | ICD-10-CM | POA: Diagnosis not present

## 2023-06-19 DIAGNOSIS — N179 Acute kidney failure, unspecified: Secondary | ICD-10-CM | POA: Diagnosis not present

## 2023-06-19 DIAGNOSIS — N1832 Chronic kidney disease, stage 3b: Secondary | ICD-10-CM | POA: Diagnosis not present

## 2023-06-19 DIAGNOSIS — Z1152 Encounter for screening for COVID-19: Secondary | ICD-10-CM | POA: Diagnosis not present

## 2023-06-19 DIAGNOSIS — Z794 Long term (current) use of insulin: Secondary | ICD-10-CM | POA: Diagnosis not present

## 2023-06-19 DIAGNOSIS — E1169 Type 2 diabetes mellitus with other specified complication: Secondary | ICD-10-CM | POA: Diagnosis not present

## 2023-06-19 DIAGNOSIS — I130 Hypertensive heart and chronic kidney disease with heart failure and stage 1 through stage 4 chronic kidney disease, or unspecified chronic kidney disease: Secondary | ICD-10-CM | POA: Diagnosis not present

## 2023-06-19 DIAGNOSIS — N3000 Acute cystitis without hematuria: Secondary | ICD-10-CM | POA: Diagnosis not present

## 2023-06-19 LAB — GLUCOSE, CAPILLARY
Glucose-Capillary: 117 mg/dL — ABNORMAL HIGH (ref 70–99)
Glucose-Capillary: 126 mg/dL — ABNORMAL HIGH (ref 70–99)
Glucose-Capillary: 134 mg/dL — ABNORMAL HIGH (ref 70–99)
Glucose-Capillary: 225 mg/dL — ABNORMAL HIGH (ref 70–99)
Glucose-Capillary: 304 mg/dL — ABNORMAL HIGH (ref 70–99)

## 2023-06-19 MED ORDER — ARIPIPRAZOLE 2 MG PO TABS
2.0000 mg | ORAL_TABLET | Freq: Every day | ORAL | Status: AC
  Administered 2023-06-19 – 2023-06-20 (×2): 2 mg via ORAL
  Filled 2023-06-19 (×2): qty 1

## 2023-06-19 NOTE — Progress Notes (Signed)
  Progress Note   Patient: Maria Fox VQQ:[id number] DOB: 1946/01/22 DOA: 06/11/2023     0 DOS: the patient was seen and examined on 06/19/2023   Brief hospital course: 78yo with h/o CAD s/p CABG, chronic HFpEF, stage 3 CKD, DM, depression, and afib who presented on 4/2 with diarrhea and abdominal pain.  She was previously held in the ER from 3/19-28 for SI with daily psychiatry input.  CT negative but noted to have worsening renal function, resolved with IVF.  +UTI with Enterobacter, Proteus, treated with Ceftriaxone -> Cefepime.  Remains with SI, has Recruitment consultant, psych consulted.  Will dc to inpatient psych.  Assessment and Plan: Suicidal ideation  - Appreciate psychiatry following along  - Cymbalta 30 mg PO daily  - Cymbalta 60 mg PO at bedtime    Acute cystitis  - Bactrim 400-80 mg PO q12    AKI superimposed on 3b CKD  - Stable    PAF - Eliquis 5 mg PO bid     CAD - Plavix 75 mg PO daily    HTN  - Imdur 15 mg PO daily  - Toprol XL 25 mg PO daily    DM2 - Novolog SS tid   HLD  - lipitor 40 mg PO at bedtime    Glaucoma  - Resume home eye drops  Subjective: Pt seen and examined at the bedside. Pt still reports feeling suicidal this morning. She also reports that if she were home she may be inclined to take the pills in her bottle (in an attempt to harm herself). Appreciate psychiatry following along.  Physical Exam: Vitals:   06/18/23 0857 06/18/23 1218 06/18/23 2004 06/19/23 0335  BP: 136/66 116/60 (!) 93/48 134/65  Pulse: 67 66 63 68  Resp:   18 17  Temp:  98.3 F (36.8 C) 97.9 F (36.6 C) 97.9 F (36.6 C)  TempSrc:      SpO2:  99% 100% 99%  Weight:      Height:       Physical Exam HENT:     Head: Normocephalic.  Cardiovascular:     Rate and Rhythm: Normal rate and regular rhythm.  Pulmonary:     Effort: Pulmonary effort is normal.  Abdominal:     General: Abdomen is flat.  Skin:    General: Skin is warm.  Neurological:     Mental Status: She is  alert.  Psychiatric:     Comments: Suicidal     Disposition: Status is: Observation The patient remains OBS appropriate and will d/c before 2 midnights.  Planned Discharge Destination: Barriers to discharge: Suicidal ideations    Time spent: 35 minutes  Author: Baron Hamper , MD 06/19/2023 8:10 AM  For on call review [email or web address].
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
## 2023-06-19 NOTE — Plan of Care (Signed)

## 2023-06-19 NOTE — Plan of Care (Signed)

## 2023-06-20 ENCOUNTER — Other Ambulatory Visit (HOSPITAL_COMMUNITY): Payer: Self-pay

## 2023-06-20 DIAGNOSIS — E1165 Type 2 diabetes mellitus with hyperglycemia: Secondary | ICD-10-CM | POA: Diagnosis not present

## 2023-06-20 DIAGNOSIS — N3000 Acute cystitis without hematuria: Secondary | ICD-10-CM | POA: Diagnosis not present

## 2023-06-20 DIAGNOSIS — Z79899 Other long term (current) drug therapy: Secondary | ICD-10-CM | POA: Diagnosis not present

## 2023-06-20 DIAGNOSIS — K219 Gastro-esophageal reflux disease without esophagitis: Secondary | ICD-10-CM | POA: Diagnosis not present

## 2023-06-20 DIAGNOSIS — E113293 Type 2 diabetes mellitus with mild nonproliferative diabetic retinopathy without macular edema, bilateral: Secondary | ICD-10-CM | POA: Diagnosis not present

## 2023-06-20 DIAGNOSIS — Z1152 Encounter for screening for COVID-19: Secondary | ICD-10-CM | POA: Diagnosis not present

## 2023-06-20 DIAGNOSIS — I130 Hypertensive heart and chronic kidney disease with heart failure and stage 1 through stage 4 chronic kidney disease, or unspecified chronic kidney disease: Secondary | ICD-10-CM | POA: Diagnosis not present

## 2023-06-20 DIAGNOSIS — E1169 Type 2 diabetes mellitus with other specified complication: Secondary | ICD-10-CM | POA: Diagnosis not present

## 2023-06-20 DIAGNOSIS — E782 Mixed hyperlipidemia: Secondary | ICD-10-CM | POA: Diagnosis not present

## 2023-06-20 DIAGNOSIS — K5900 Constipation, unspecified: Secondary | ICD-10-CM | POA: Diagnosis not present

## 2023-06-20 DIAGNOSIS — E1122 Type 2 diabetes mellitus with diabetic chronic kidney disease: Secondary | ICD-10-CM | POA: Diagnosis not present

## 2023-06-20 DIAGNOSIS — I69911 Memory deficit following unspecified cerebrovascular disease: Secondary | ICD-10-CM | POA: Diagnosis not present

## 2023-06-20 DIAGNOSIS — Z794 Long term (current) use of insulin: Secondary | ICD-10-CM | POA: Diagnosis not present

## 2023-06-20 DIAGNOSIS — I2510 Atherosclerotic heart disease of native coronary artery without angina pectoris: Secondary | ICD-10-CM | POA: Diagnosis not present

## 2023-06-20 DIAGNOSIS — I5030 Unspecified diastolic (congestive) heart failure: Secondary | ICD-10-CM | POA: Diagnosis not present

## 2023-06-20 DIAGNOSIS — N1832 Chronic kidney disease, stage 3b: Secondary | ICD-10-CM | POA: Diagnosis not present

## 2023-06-20 DIAGNOSIS — I480 Paroxysmal atrial fibrillation: Secondary | ICD-10-CM | POA: Diagnosis not present

## 2023-06-20 DIAGNOSIS — Z951 Presence of aortocoronary bypass graft: Secondary | ICD-10-CM | POA: Diagnosis not present

## 2023-06-20 DIAGNOSIS — N179 Acute kidney failure, unspecified: Secondary | ICD-10-CM | POA: Diagnosis not present

## 2023-06-20 DIAGNOSIS — R45851 Suicidal ideations: Secondary | ICD-10-CM | POA: Diagnosis not present

## 2023-06-20 DIAGNOSIS — E876 Hypokalemia: Secondary | ICD-10-CM | POA: Diagnosis not present

## 2023-06-20 LAB — GLUCOSE, CAPILLARY
Glucose-Capillary: 223 mg/dL — ABNORMAL HIGH (ref 70–99)
Glucose-Capillary: 310 mg/dL — ABNORMAL HIGH (ref 70–99)
Glucose-Capillary: 120 mg/dL — ABNORMAL HIGH (ref 70–99)
Glucose-Capillary: 193 mg/dL — ABNORMAL HIGH (ref 70–99)

## 2023-06-20 MED ORDER — DULOXETINE HCL 30 MG PO CPEP
30.0000 mg | ORAL_CAPSULE | Freq: Every day | ORAL | 0 refills | Status: DC
  Filled 2023-06-20: qty 30, 30d supply, fill #0

## 2023-06-20 MED ORDER — DULOXETINE HCL 60 MG PO CPEP: 60.0000 mg | ORAL_CAPSULE | Freq: Every day | ORAL | Status: DC

## 2023-06-20 MED ORDER — ARIPIPRAZOLE 2 MG PO TABS
2.0000 mg | ORAL_TABLET | Freq: Every day | ORAL | 0 refills | Status: DC
  Filled 2023-06-20: qty 60, 60d supply, fill #0

## 2023-06-20 MED ORDER — ARIPIPRAZOLE 2 MG PO TABS
2.0000 mg | ORAL_TABLET | Freq: Every day | ORAL | Status: DC
  Administered 2023-06-21 – 2023-06-24 (×4): 2 mg via ORAL
  Filled 2023-06-20 (×4): qty 1

## 2023-06-20 NOTE — Discharge Summary (Signed)
 Physician Discharge Summary  Maria Fox ZOX:[id number] DOB: May 10, 1945 DOA: 06/11/2023  PCP: Eustaquio Boyden, MD  Admit date: 06/11/2023 Discharge date: 06/21/23  Admitted From: Home Disposition:  Home  Discharge Condition:Stable CODE STATUS:FULL Diet recommendation: Heart Healthy  Brief/Interim Summary: Patient is a 78 yo F with h/o CAD s/p CABG, chronic HFpEF, stage 3 CKD, DM, depression, and afib who presented on 4/2 with diarrhea and abdominal pain. She was previously held in the ER from 3/19-28 for SI with daily psychiatry input. CT negative but noted to have worsening renal function, resolved with IVF. +UTI with Enterobacter, Proteus, treated with Ceftriaxone -> Cefepime.Abx changed to  oral and she completed the course.  Psychiatry was following here, initially recommending inpatient psychiatry admission but now is cleared for discharge home.  Her family can pick her up from hospital only tomorrow.  Plan for discharge tomorrow a.m.  Following problems were addressed during the hospitalization:  Suicidal ideation  - Psychiatry was following.  Initially recommended inpatient psychiatry, now cleared for home.  Recommended to continue Abilify, Cymbalta.  Also on Seroquel at home.  We recommend to follow-up with psychiatry as an outpatient.   Acute cystitis  - Urine culture showed Enterobacter, Proteus.  Completed a course of Bactrim.   AKI superimposed on 3b CKD  - Currently kidney function at baseline  PAF - Eliquis 5 mg PO bid to be continued   CAD - Plavix 75 mg PO daily to be continued   HTN  - Her home medications include Imdur 15 mg PO daily ,Toprol XL 25 mg PO daily .  These medication will be continued   DM2 - She takes glipizide, metformin at home.  Continue on discharge   HLD  - On lipitor 40 mg PO at bedtime    Glaucoma  - Resume home eye drops   Discharge Diagnoses:  Principal Problem:   Suicidal ideation Active Problems:   Acute diarrhea   CAD,  multiple vessel s/p CABG x 3   Chronic kidney disease, stage 3a ([name])   Essential hypertension   Mixed diabetic hyperlipidemia associated with type 2 diabetes mellitus ([name])   Hypokalemia   Paroxysmal atrial fibrillation ([name])   Type 2 diabetes mellitus with mild nonproliferative diabetic retinopathy without macular edema, bilateral ([name])   Acute cystitis without hematuria   Memory deficit   Acute renal failure superimposed on stage 3a chronic kidney disease ([name])    Discharge Instructions  Discharge Instructions     Diet - low sodium heart healthy   Complete by: As directed    Discharge instructions   Complete by: As directed    1)Please take your medications as instructed 2)Follow up with your psychiatrist, PCP   Increase activity slowly   Complete by: As directed       Allergies as of 06/20/2023       Reactions   Bee Venom Anaphylaxis, Swelling   Mushroom Extract Complex (obsolete) Anaphylaxis   Penicillins Anaphylaxis, Hives, Swelling, Other (See Comments)   Tolerates cephalosporins including cephalexin multiple times.    Codeine Nausea And Vomiting   Sulfa Antibiotics Nausea And Vomiting   Iohexol Itching, Swelling, Other (See Comments)   Iohexol, sold under the trade name Omnipaque among others, is a contrast agent used for X-ray imaging.        Medication List     TAKE these medications    acetaminophen 650 MG CR tablet Commonly known as: TYLENOL Take 1,300 mg by mouth every 6 (six) hours  as needed for pain.   albuterol (2.5 MG/3ML) 0.083% nebulizer solution Commonly known as: PROVENTIL Inhale 3 mLs into the lungs every 6 (six) hours as needed for wheezing or shortness of breath.   albuterol 108 (90 Base) MCG/ACT inhaler Commonly known as: VENTOLIN HFA Inhale 1-2 puffs into the lungs every 6 (six) hours as needed for wheezing or shortness of breath.   apixaban 5 MG Tabs tablet Commonly known as: Eliquis Take 1 tablet (5 mg total) by mouth 2 (two)  times daily.   ARIPiprazole 2 MG tablet Commonly known as: ABILIFY Take 1 tablet (2 mg total) by mouth daily. Start taking on: June 21, 2023   atorvastatin 40 MG tablet Commonly known as: LIPITOR TAKE 1 TABLET BY MOUTH EVERY EVENING   clopidogrel 75 MG tablet Commonly known as: PLAVIX Take 1 tablet (75 mg total) by mouth daily.   dorzolamide-timolol 2-0.5 % ophthalmic solution Commonly known as: COSOPT Place 1 drop into both eyes 2 (two) times daily.   DULoxetine 60 MG capsule Commonly known as: CYMBALTA Take 1 capsule (60 mg total) by mouth at bedtime. What changed: when to take this   DULoxetine 30 MG capsule Commonly known as: CYMBALTA Take 1 capsule (30 mg total) by mouth daily. Start taking on: June 21, 2023 What changed: You were already taking a medication with the same name, and this prescription was added. Make sure you understand how and when to take each.   feeding supplement (GLUCERNA SHAKE) Liqd Take 237 mLs by mouth 3 (three) times daily between meals. What changed: when to take this   glipiZIDE 10 MG tablet Commonly known as: GLUCOTROL Take 1 tablet (10 mg total) by mouth 2 (two) times daily. Always take with food   hydrocortisone cream 1 % Apply topically 3 (three) times daily. What changed:  how much to take when to take this reasons to take this   isosorbide mononitrate 30 MG 24 hr tablet Commonly known as: IMDUR Take 0.5 tablets (15 mg total) by mouth daily.   latanoprost 0.005 % ophthalmic solution Commonly known as: XALATAN Place 1 drop into both eyes at bedtime.   melatonin 5 MG Tabs Take 5 mg by mouth at bedtime as needed (sleep).   metFORMIN 500 MG 24 hr tablet Commonly known as: GLUCOPHAGE-XR Take 1 tablet (500 mg total) by mouth daily with breakfast.   metoprolol succinate 25 MG 24 hr tablet Commonly known as: TOPROL-XL Take 1 tablet (25 mg total) by mouth daily.   nitroGLYCERIN 0.4 MG SL tablet Commonly known as:  NITROSTAT Place 0.4 mg under the tongue every 5 (five) minutes as needed for chest pain.   pantoprazole 40 MG tablet Commonly known as: PROTONIX Take 1 tablet (40 mg total) by mouth daily. What changed: when to take this   QUEtiapine 50 MG tablet Commonly known as: SEROQUEL Take 50 mg by mouth at bedtime.        Allergies  Allergen Reactions   Bee Venom Anaphylaxis and Swelling   Mushroom Extract Complex (Obsolete) Anaphylaxis   Penicillins Anaphylaxis, Hives, Swelling and Other (See Comments)    Tolerates cephalosporins including cephalexin multiple times.    Codeine Nausea And Vomiting   Sulfa Antibiotics Nausea And Vomiting   Iohexol Itching, Swelling and Other (See Comments)    Iohexol, sold under the trade name Omnipaque among others, is a contrast agent used for X-ray imaging.    Consultations: Psychiatry   Procedures/Studies: CT ABDOMEN PELVIS WO CONTRAST Result Date: 06/11/2023  CLINICAL DATA:  Abdominal pain EXAM: CT ABDOMEN AND PELVIS WITHOUT CONTRAST TECHNIQUE: Multidetector CT imaging of the abdomen and pelvis was performed following the standard protocol without IV contrast. RADIATION DOSE REDUCTION: This exam was performed according to the departmental dose-optimization program which includes automated exposure control, adjustment of the mA and/or kV according to patient size and/or use of iterative reconstruction technique. COMPARISON:  02/24/2023 FINDINGS: Lower chest: No acute abnormality Hepatobiliary: No focal liver abnormality is seen. Status post cholecystectomy. No biliary dilatation. Few scattered calcifications. Pancreas: No focal abnormality or ductal dilatation. Spleen: Scattered calcifications.  Normal size. Adrenals/Urinary Tract: No adrenal abnormality. No focal renal abnormality. No stones or hydronephrosis. Urinary bladder is unremarkable. Stomach/Bowel: Left colonic diverticulosis. No active diverticulitis. Normal appendix. Stomach and small bowel  decompressed. No bowel obstruction or inflammatory process. Vascular/Lymphatic: Aortoiliac atherosclerosis. No evidence of aneurysm or adenopathy. Reproductive: Uterus and adnexa unremarkable.  No mass. Other: No free fluid or free air.  No acute Musculoskeletal: Bony abnormality. IMPRESSION: No acute findings in the abdomen or pelvis. Old granulomatous disease in the liver and spleen. Aortic atherosclerosis. Left colonic diverticulosis. Electronically Signed   By: Charlett Nose M.D.   On: 06/11/2023 21:04      Subjective: Patient seen and examined at the bedside today.  He appeared comfortable.  Lying in bed.  Not in any kind of distress.  Complains of some headache.  Any dysuria.  Afebrile, hemodynamically stable.  Looks depressed and apathetic.  Discharge Exam: Vitals:   06/20/23 0448 06/20/23 1307  BP: 138/65 109/63  Pulse: 66 65  Resp: 20 17  Temp: 98 F (36.7 C) 98 F (36.7 C)  SpO2: 98% 96%   Vitals:   06/19/23 1227 06/19/23 1910 06/20/23 0448 06/20/23 1307  BP: 118/63 119/61 138/65 109/63  Pulse: 65 64 66 65  Resp: 18 16 20 17   Temp: (!) 97.5 F (36.4 C) 97.9 F (36.6 C) 98 F (36.7 C) 98 F (36.7 C)  TempSrc: Oral   Oral  SpO2: 100% 97% 98% 96%  Weight:      Height:        General: Pt is alert, awake, not in acute distress, lying on bed Cardiovascular: RRR, S1/S2 +, no rubs, no gallops Respiratory: CTA bilaterally, no wheezing, no rhonchi Abdominal: Soft, NT, ND, bowel sounds + Extremities: no edema, no cyanosis    The results of significant diagnostics from this hospitalization (including imaging, microbiology, ancillary and laboratory) are listed below for reference.     Microbiology: Recent Results (from the past 240 hours)  Culture, blood (routine x 2)     Status: None   Collection Time: 06/11/23  8:06 PM   Specimen: BLOOD  Result Value Ref Range Status   Specimen Description   Final    BLOOD RIGHT ANTECUBITAL Performed at [facility], [address], [place], [place] [address]    Special Requests   Final    BOTTLES DRAWN AEROBIC AND ANAEROBIC Blood Culture results may not be optimal due to an inadequate volume of blood received in culture bottles Performed at [facility], [address] [place], [place] [address]    Culture   Final    NO GROWTH 5 DAYS Performed at [facility], [address] [place], [place] [address]    Report Status 06/16/2023 FINAL  Final  Resp panel by RT-PCR (RSV, Flu A&B, Covid)     Status: None   Collection Time: 06/11/23  8:06  PM   Specimen: Nasal Swab  Result Value Ref Range Status   SARS Coronavirus 2 by RT PCR NEGATIVE NEGATIVE Final    Comment: (NOTE) SARS-CoV-2 target nucleic acids are NOT DETECTED.  The SARS-CoV-2 RNA is generally detectable in upper respiratory specimens during the acute phase of infection. The lowest concentration of SARS-CoV-2 viral copies this assay can detect is 138 copies/mL. A negative result does not preclude SARS-Cov-2 infection and should not be used as the sole basis for treatment or other patient management decisions. A negative result may occur with  improper specimen collection/handling, submission of specimen other than nasopharyngeal swab, presence of viral mutation(s) within the areas targeted by this assay, and inadequate number of viral copies(<138 copies/mL). A negative result must be combined with clinical observations, patient history, and epidemiological information. The expected result is Negative.  Fact Sheet for Patients:  BloggerCourse.com  Fact Sheet for Healthcare Providers:  SeriousBroker.it  This test is no t yet approved or cleared by the [place] FDA and  has been authorized for detection and/or diagnosis of SARS-CoV-2 by FDA under an Emergency Use Authorization (EUA). This EUA will remain  in effect (meaning this test can be used)  for the duration of the COVID-19 declaration under Section 564(b)(1) of the Act, 21 U.S.C.section 360bbb-3(b)(1), unless the authorization is terminated  or revoked sooner.       Influenza A by PCR NEGATIVE NEGATIVE Final   Influenza B by PCR NEGATIVE NEGATIVE Final    Comment: (NOTE) The Xpert Xpress SARS-CoV-2/FLU/RSV plus assay is intended as an aid in the diagnosis of influenza from Nasopharyngeal swab specimens and should not be used as a sole basis for treatment. Nasal washings and aspirates are unacceptable for Xpert Xpress SARS-CoV-2/FLU/RSV testing.  Fact Sheet for Patients: BloggerCourse.com  Fact Sheet for Healthcare Providers: SeriousBroker.it  This test is not yet approved or cleared by the [place] FDA and has been authorized for detection and/or diagnosis of SARS-CoV-2 by FDA under an Emergency Use Authorization (EUA). This EUA will remain in effect (meaning this test can be used) for the duration of the COVID-19 declaration under Section 564(b)(1) of the Act, 21 U.S.C. section 360bbb-3(b)(1), unless the authorization is terminated or revoked.     Resp Syncytial Virus by PCR NEGATIVE NEGATIVE Final    Comment: (NOTE) Fact Sheet for Patients: BloggerCourse.com  Fact Sheet for Healthcare Providers: SeriousBroker.it  This test is not yet approved or cleared by the [place] FDA and has been authorized for detection and/or diagnosis of SARS-CoV-2 by FDA under an Emergency Use Authorization (EUA). This EUA will remain in effect (meaning this test can be used) for the duration of the COVID-19 declaration under Section 564(b)(1) of the Act, 21 U.S.C. section 360bbb-3(b)(1), unless the authorization is terminated or revoked.  Performed at [facility], [address], [place], [place] [address]   Culture, blood (routine x 2)     Status:  None   Collection Time: 06/11/23  9:00 PM   Specimen: BLOOD  Result Value Ref Range Status   Specimen Description   Final    BLOOD LEFT ANTECUBITAL Performed at [facility], [address], [place], [place] [address]    Special Requests   Final    BOTTLES DRAWN AEROBIC AND ANAEROBIC Blood Culture results may not be optimal due to an inadequate volume of blood received in culture bottles Performed at [facility], [address] [place], [place] [address]  Culture   Final    NO GROWTH 5 DAYS Performed at [facility], [address] [place], [place] [address]    Report Status 06/16/2023 FINAL  Final  C Difficile Quick Screen w PCR reflex     Status: None   Collection Time: 06/12/23 10:34 AM   Specimen: STOOL  Result Value Ref Range Status   C Diff antigen NEGATIVE NEGATIVE Final   C Diff toxin NEGATIVE NEGATIVE Final   C Diff interpretation No C. difficile detected.  Final    Comment: Performed at [facility], [address] [place], [place] [address]  Urine Culture     Status: Abnormal   Collection Time: 06/12/23  2:10 PM   Specimen: Urine, Clean Catch  Result Value Ref Range Status   Specimen Description   Final    URINE, CLEAN CATCH Performed at [facility], [address], [place], [place] [address]    Special Requests   Final    NONE Reflexed from [id number] Performed at [facility], [address] [place], [place] [address]    Culture (A)  Final    >=100,000 COLONIES/mL ENTEROBACTER CLOACAE >=100,000 COLONIES/mL PROTEUS MIRABILIS    Report Status 06/15/2023 FINAL  Final   Organism ID, Bacteria ENTEROBACTER CLOACAE (A)  Final   Organism ID, Bacteria PROTEUS MIRABILIS (A)  Final      Susceptibility   Enterobacter cloacae - MIC*    CEFEPIME <=0.12 SENSITIVE Sensitive     CIPROFLOXACIN <=0.25 SENSITIVE Sensitive     GENTAMICIN <=1 SENSITIVE Sensitive     IMIPENEM 0.5  SENSITIVE Sensitive     NITROFURANTOIN 64 INTERMEDIATE Intermediate     TRIMETH/SULFA <=20 SENSITIVE Sensitive     PIP/TAZO <=4 SENSITIVE Sensitive ug/mL    * >=100,000 COLONIES/mL ENTEROBACTER CLOACAE   Proteus mirabilis - MIC*    AMPICILLIN <=2 SENSITIVE Sensitive     CEFAZOLIN <=4 SENSITIVE Sensitive     CEFEPIME 0.5 SENSITIVE Sensitive     CEFTRIAXONE <=0.25 SENSITIVE Sensitive     CIPROFLOXACIN >=4 RESISTANT Resistant     GENTAMICIN <=1 SENSITIVE Sensitive     IMIPENEM 2 SENSITIVE Sensitive     NITROFURANTOIN RESISTANT Resistant     TRIMETH/SULFA 40 SENSITIVE Sensitive     AMPICILLIN/SULBACTAM <=2 SENSITIVE Sensitive     PIP/TAZO <=4 SENSITIVE Sensitive ug/mL    * >=100,000 COLONIES/mL PROTEUS MIRABILIS     Labs: BNP (last 3 results) Recent Labs    08/23/22 1633  BNP 104*   Basic Metabolic Panel: Recent Labs  Lab 06/14/23 0543 06/15/23 0535 06/16/23 0510  NA 135 132* 134*  K 4.6 4.5 4.8  CL 107 104 104  CO2 19* 18* 22  GLUCOSE 170* 206* 225*  BUN 18 18 23   CREATININE 1.26* 1.07* 1.46*  CALCIUM 8.7* 9.0 9.1  MG 1.7  --   --   PHOS  --  2.8  --    Liver Function Tests: Recent Labs  Lab 06/14/23 0543 06/15/23 0535 06/16/23 0510  AST 11*  --  13*  ALT 8  --  10  ALKPHOS 55  --  61  BILITOT 0.6  --  0.5  PROT 6.1*  --  7.0  ALBUMIN 3.0* 3.2* 3.3*   No results for input(s): "LIPASE", "AMYLASE" in the last 168 hours. No results for input(s): "AMMONIA" in the last 168 hours. CBC: Recent Labs  Lab 06/14/23 0543 06/15/23 0535 06/16/23 0510  WBC 5.9  6.9 7.1  NEUTROABS 3.5 4.2 4.0  HGB 10.5* 11.2* 11.4*  HCT 32.5* 34.5* 35.7*  MCV 90.3 88.0 88.4  PLT 194 222 254   Cardiac Enzymes: No results for input(s): "CKTOTAL", "CKMB", "CKMBINDEX", "TROPONINI" in the last 168 hours. BNP: Invalid input(s): "POCBNP" CBG: Recent Labs  Lab 06/19/23 1613 06/19/23 1943 06/19/23 2340 06/20/23 0749 06/20/23 1142  GLUCAP 117* 134* 126* 223* 310*   D-Dimer No  results for input(s): "DDIMER" in the last 72 hours. Hgb A1c No results for input(s): "HGBA1C" in the last 72 hours. Lipid Profile No results for input(s): "CHOL", "HDL", "LDLCALC", "TRIG", "CHOLHDL", "LDLDIRECT" in the last 72 hours. Thyroid function studies No results for input(s): "TSH", "T4TOTAL", "T3FREE", "THYROIDAB" in the last 72 hours.  Invalid input(s): "FREET3" Anemia work up No results for input(s): "VITAMINB12", "FOLATE", "FERRITIN", "TIBC", "IRON", "RETICCTPCT" in the last 72 hours. Urinalysis    Component Value Date/Time   COLORURINE STRAW (A) 06/12/2023 1410   APPEARANCEUR HAZY (A) 06/12/2023 1410   LABSPEC 1.011 06/12/2023 1410   PHURINE 5.0 06/12/2023 1410   GLUCOSEU NEGATIVE 06/12/2023 1410   HGBUR NEGATIVE 06/12/2023 1410   BILIRUBINUR NEGATIVE 06/12/2023 1410   BILIRUBINUR negative 05/14/2023 1625   KETONESUR NEGATIVE 06/12/2023 1410   PROTEINUR NEGATIVE 06/12/2023 1410   UROBILINOGEN 0.2 05/14/2023 1625   UROBILINOGEN 0.2 09/16/2014 0009   NITRITE NEGATIVE 06/12/2023 1410   LEUKOCYTESUR MODERATE (A) 06/12/2023 1410   Sepsis Labs Recent Labs  Lab 06/14/23 0543 06/15/23 0535 06/16/23 0510  WBC 5.9 6.9 7.1   Microbiology Recent Results (from the past 240 hours)  Culture, blood (routine x 2)     Status: None   Collection Time: 06/11/23  8:06 PM   Specimen: BLOOD  Result Value Ref Range Status   Specimen Description   Final    BLOOD RIGHT ANTECUBITAL Performed at [facility], [address], [place], [place] [address]    Special Requests   Final    BOTTLES DRAWN AEROBIC AND ANAEROBIC Blood Culture results may not be optimal due to an inadequate volume of blood received in culture bottles Performed at [facility], [address] [place], [place] [address]    Culture   Final    NO GROWTH 5 DAYS Performed at [facility], [address], [place], [place] [address]    Report Status 06/16/2023 FINAL   Final  Resp panel by RT-PCR (RSV, Flu A&B, Covid)     Status: None   Collection Time: 06/11/23  8:06 PM   Specimen: Nasal Swab  Result Value Ref Range Status   SARS Coronavirus 2 by RT PCR NEGATIVE NEGATIVE Final    Comment: (NOTE) SARS-CoV-2 target nucleic acids are NOT DETECTED.  The SARS-CoV-2 RNA is generally detectable in upper respiratory specimens during the acute phase of infection. The lowest concentration of SARS-CoV-2 viral copies this assay can detect is 138 copies/mL. A negative result does not preclude SARS-Cov-2 infection and should not be used as the sole basis for treatment or other patient management decisions. A negative result may occur with  improper specimen collection/handling, submission of specimen other than nasopharyngeal swab, presence of viral mutation(s) within the areas targeted by this assay, and inadequate number of viral copies(<138 copies/mL). A negative result must be combined with clinical observations, patient history, and epidemiological information. The expected result is Negative.  Fact Sheet for Patients:  BloggerCourse.com  Fact Sheet for Healthcare Providers:  SeriousBroker.it  This test is no  t yet approved or cleared by the [place] and  has been authorized for detection and/or diagnosis of SARS-CoV-2 by FDA under an Emergency Use Authorization (EUA). This EUA will remain  in effect (meaning this test can be used) for the duration of the COVID-19 declaration under Section 564(b)(1) of the Act, 21 U.S.C.section 360bbb-3(b)(1), unless the authorization is terminated  or revoked sooner.       Influenza A by PCR NEGATIVE NEGATIVE Final   Influenza B by PCR NEGATIVE NEGATIVE Final    Comment: (NOTE) The Xpert Xpress SARS-CoV-2/FLU/RSV plus assay is intended as an aid in the diagnosis of influenza from Nasopharyngeal swab specimens and should not be used as a sole basis for  treatment. Nasal washings and aspirates are unacceptable for Xpert Xpress SARS-CoV-2/FLU/RSV testing.  Fact Sheet for Patients: BloggerCourse.com  Fact Sheet for Healthcare Providers: SeriousBroker.it  This test is not yet approved or cleared by the [place] FDA and has been authorized for detection and/or diagnosis of SARS-CoV-2 by FDA under an Emergency Use Authorization (EUA). This EUA will remain in effect (meaning this test can be used) for the duration of the COVID-19 declaration under Section 564(b)(1) of the Act, 21 U.S.C. section 360bbb-3(b)(1), unless the authorization is terminated or revoked.     Resp Syncytial Virus by PCR NEGATIVE NEGATIVE Final    Comment: (NOTE) Fact Sheet for Patients: BloggerCourse.com  Fact Sheet for Healthcare Providers: SeriousBroker.it  This test is not yet approved or cleared by the [place] FDA and has been authorized for detection and/or diagnosis of SARS-CoV-2 by FDA under an Emergency Use Authorization (EUA). This EUA will remain in effect (meaning this test can be used) for the duration of the COVID-19 declaration under Section 564(b)(1) of the Act, 21 U.S.C. section 360bbb-3(b)(1), unless the authorization is terminated or revoked.  Performed at [facility], [address], [place], [place] [address]   Culture, blood (routine x 2)     Status: None   Collection Time: 06/11/23  9:00 PM   Specimen: BLOOD  Result Value Ref Range Status   Specimen Description   Final    BLOOD LEFT ANTECUBITAL Performed at [facility], [address], [place], [place] [address]    Special Requests   Final    BOTTLES DRAWN AEROBIC AND ANAEROBIC Blood Culture results may not be optimal due to an inadequate volume of blood received in culture bottles Performed at [facility], [address], [place], [place] [address]    Culture   Final    NO GROWTH 5 DAYS Performed at [facility], [address] [place], [place] [address]    Report Status 06/16/2023 FINAL  Final  C Difficile Quick Screen w PCR reflex     Status: None   Collection Time: 06/12/23 10:34 AM   Specimen: STOOL  Result Value Ref Range Status   C Diff antigen NEGATIVE NEGATIVE Final   C Diff toxin NEGATIVE NEGATIVE Final   C Diff interpretation No C. difficile detected.  Final    Comment: Performed at [facility], [address] [place], [place] [address]  Urine Culture     Status: Abnormal   Collection Time: 06/12/23  2:10 PM   Specimen: Urine, Clean Catch  Result Value Ref Range Status   Specimen Description   Final    URINE, CLEAN CATCH Performed at [facility], [address] [place], [place] [address]  Special Requests   Final    NONE Reflexed from [id number] Performed at [facility], [address], [place], [place] [address]    Culture (A)  Final    >=100,000 COLONIES/mL ENTEROBACTER CLOACAE >=100,000 COLONIES/mL PROTEUS MIRABILIS    Report Status 06/15/2023 FINAL  Final   Organism ID, Bacteria ENTEROBACTER CLOACAE (A)  Final   Organism ID, Bacteria PROTEUS MIRABILIS (A)  Final      Susceptibility   Enterobacter cloacae - MIC*    CEFEPIME <=0.12 SENSITIVE Sensitive     CIPROFLOXACIN <=0.25 SENSITIVE Sensitive     GENTAMICIN <=1 SENSITIVE Sensitive     IMIPENEM 0.5 SENSITIVE Sensitive     NITROFURANTOIN 64 INTERMEDIATE Intermediate     TRIMETH/SULFA <=20 SENSITIVE Sensitive     PIP/TAZO <=4 SENSITIVE Sensitive ug/mL    * >=100,000 COLONIES/mL ENTEROBACTER CLOACAE   Proteus mirabilis - MIC*    AMPICILLIN <=2 SENSITIVE Sensitive     CEFAZOLIN <=4 SENSITIVE Sensitive     CEFEPIME 0.5 SENSITIVE Sensitive     CEFTRIAXONE <=0.25 SENSITIVE Sensitive     CIPROFLOXACIN >=4 RESISTANT Resistant     GENTAMICIN <=1 SENSITIVE Sensitive      IMIPENEM 2 SENSITIVE Sensitive     NITROFURANTOIN RESISTANT Resistant     TRIMETH/SULFA 40 SENSITIVE Sensitive     AMPICILLIN/SULBACTAM <=2 SENSITIVE Sensitive     PIP/TAZO <=4 SENSITIVE Sensitive ug/mL    * >=100,000 COLONIES/mL PROTEUS MIRABILIS    Please note: You were cared for by a hospitalist during your hospital stay. Once you are discharged, your primary care physician will handle any further medical issues. Please note that NO REFILLS for any discharge medications will be authorized once you are discharged, as it is imperative that you return to your primary care physician (or establish a relationship with a primary care physician if you do not have one) for your post hospital discharge needs so that they can reassess your need for medications and monitor your lab values.    Time coordinating discharge: 40 minutes  SIGNED:   Burnadette Pop, MD  [facility] 06/20/2023, 1:40 PM Pager [phone]  If 7PM-7AM, please contact night-coverage [email or web address] Password [email or web address]
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
## 2023-06-20 NOTE — Progress Notes (Signed)
 Mobility Specialist - Progress Note   06/20/23 1029  Mobility  Activity Ambulated with assistance in hallway  Level of Assistance Modified independent, requires aide device or extra time  Assistive Device Front wheel walker  Distance Ambulated (ft) 200 ft  Range of Motion/Exercises Active  Activity Response Tolerated well  Mobility Referral Yes  Mobility visit 1 Mobility  Mobility Specialist Start Time (ACUTE ONLY) 1015  Mobility Specialist Stop Time (ACUTE ONLY) 1029  Mobility Specialist Time Calculation (min) (ACUTE ONLY) 14 min   Pt was found in bed and agreeable to ambulate. Stated having BLE weakness. At EOS returned to bed with all needs met. Call bell in reach and bed alarm on.  Billey Chang Mobility Specialist

## 2023-06-20 NOTE — Progress Notes (Signed)
 TOC meds in a secure bag for discharge delivered to inpatient pharmacy by this RN- primary nurse updated by this RN

## 2023-06-20 NOTE — Plan of Care (Signed)

## 2023-06-20 NOTE — TOC Transition Note (Signed)
 Transition of Care [facility]) - Discharge Note   Patient Details  Name: Maria Fox MRN: [id number] Date of Birth: October 02, 1945  Transition of Care [facility]) CM/SW Contact:  Otelia Santee, LCSW Phone Number: 06/20/2023, 10:46 AM   Clinical Narrative:    Pt has been psychiatrically cleared. Plan for pt to return home w/ s/o tomorrow. [name] to provide transportation at discharge. No further TOC needs identified. TOC signing off.   Final next level of care: Home/Self Care Barriers to Discharge: No Barriers Identified   Patient Goals and CMS Choice Patient states their goals for this hospitalization and ongoing recovery are:: To return home          Discharge Placement                       Discharge Plan and Services Additional resources added to the After Visit Summary for                                       Social Drivers of Health (SDOH) Interventions SDOH Screenings   Food Insecurity: No Food Insecurity (06/11/2023)  Housing: Low Risk  (06/11/2023)  Transportation Needs: No Transportation Needs (06/11/2023)  Utilities: Not At Risk (06/11/2023)  Alcohol Screen: Low Risk  (03/17/2023)  Depression (PHQ2-9): High Risk (12/27/2022)  Financial Resource Strain: Low Risk  (12/03/2022)  Physical Activity: Inactive (12/03/2022)  Social Connections: Moderately Integrated (03/17/2023)  Stress: No Stress Concern Present (12/03/2022)  Tobacco Use: Low Risk  (06/12/2023)  Health Literacy: Adequate Health Literacy (12/03/2022)     Readmission Risk Interventions    07/25/2022   10:12 AM 02/26/2022   12:55 PM  Readmission Risk Prevention Plan  Transportation Screening Complete Complete  PCP or Specialist Appt within 5-7 Days Complete   Home Care Screening Complete   Medication Review (RN CM) Complete   HRI or Home Care Consult  Complete  Social Work Consult for Recovery Care Planning/Counseling  Complete  Palliative Care Screening  Not Applicable

## 2023-06-21 DIAGNOSIS — Z794 Long term (current) use of insulin: Secondary | ICD-10-CM | POA: Diagnosis not present

## 2023-06-21 DIAGNOSIS — N3000 Acute cystitis without hematuria: Secondary | ICD-10-CM | POA: Diagnosis not present

## 2023-06-21 DIAGNOSIS — Z1152 Encounter for screening for COVID-19: Secondary | ICD-10-CM | POA: Diagnosis not present

## 2023-06-21 DIAGNOSIS — I480 Paroxysmal atrial fibrillation: Secondary | ICD-10-CM | POA: Diagnosis not present

## 2023-06-21 DIAGNOSIS — E113293 Type 2 diabetes mellitus with mild nonproliferative diabetic retinopathy without macular edema, bilateral: Secondary | ICD-10-CM | POA: Diagnosis not present

## 2023-06-21 DIAGNOSIS — I130 Hypertensive heart and chronic kidney disease with heart failure and stage 1 through stage 4 chronic kidney disease, or unspecified chronic kidney disease: Secondary | ICD-10-CM | POA: Diagnosis not present

## 2023-06-21 DIAGNOSIS — E1169 Type 2 diabetes mellitus with other specified complication: Secondary | ICD-10-CM | POA: Diagnosis not present

## 2023-06-21 DIAGNOSIS — Z79899 Other long term (current) drug therapy: Secondary | ICD-10-CM | POA: Diagnosis not present

## 2023-06-21 DIAGNOSIS — N179 Acute kidney failure, unspecified: Secondary | ICD-10-CM | POA: Diagnosis not present

## 2023-06-21 DIAGNOSIS — E876 Hypokalemia: Secondary | ICD-10-CM | POA: Diagnosis not present

## 2023-06-21 DIAGNOSIS — E782 Mixed hyperlipidemia: Secondary | ICD-10-CM | POA: Diagnosis not present

## 2023-06-21 DIAGNOSIS — E1165 Type 2 diabetes mellitus with hyperglycemia: Secondary | ICD-10-CM | POA: Diagnosis not present

## 2023-06-21 DIAGNOSIS — R45851 Suicidal ideations: Secondary | ICD-10-CM | POA: Diagnosis not present

## 2023-06-21 DIAGNOSIS — K219 Gastro-esophageal reflux disease without esophagitis: Secondary | ICD-10-CM | POA: Diagnosis not present

## 2023-06-21 DIAGNOSIS — I2510 Atherosclerotic heart disease of native coronary artery without angina pectoris: Secondary | ICD-10-CM | POA: Diagnosis not present

## 2023-06-21 DIAGNOSIS — E1122 Type 2 diabetes mellitus with diabetic chronic kidney disease: Secondary | ICD-10-CM | POA: Diagnosis not present

## 2023-06-21 DIAGNOSIS — N1832 Chronic kidney disease, stage 3b: Secondary | ICD-10-CM | POA: Diagnosis not present

## 2023-06-21 DIAGNOSIS — I5030 Unspecified diastolic (congestive) heart failure: Secondary | ICD-10-CM | POA: Diagnosis not present

## 2023-06-21 DIAGNOSIS — K5900 Constipation, unspecified: Secondary | ICD-10-CM | POA: Diagnosis not present

## 2023-06-21 DIAGNOSIS — I69911 Memory deficit following unspecified cerebrovascular disease: Secondary | ICD-10-CM | POA: Diagnosis not present

## 2023-06-21 DIAGNOSIS — Z951 Presence of aortocoronary bypass graft: Secondary | ICD-10-CM | POA: Diagnosis not present

## 2023-06-21 LAB — GLUCOSE, CAPILLARY
Glucose-Capillary: 253 mg/dL — ABNORMAL HIGH (ref 70–99)
Glucose-Capillary: 310 mg/dL — ABNORMAL HIGH (ref 70–99)
Glucose-Capillary: 130 mg/dL — ABNORMAL HIGH (ref 70–99)
Glucose-Capillary: 153 mg/dL — ABNORMAL HIGH (ref 70–99)

## 2023-06-21 MED ORDER — LACTULOSE 10 GM/15ML PO SOLN
20.0000 g | Freq: Two times a day (BID) | ORAL | Status: DC
  Administered 2023-06-21 – 2023-06-23 (×5): 20 g via ORAL
  Filled 2023-06-21 (×5): qty 30

## 2023-06-21 MED ORDER — BISACODYL 10 MG RE SUPP
10.0000 mg | Freq: Once | RECTAL | Status: AC
  Administered 2023-06-21: 10 mg via RECTAL
  Filled 2023-06-21: qty 1

## 2023-06-21 NOTE — Plan of Care (Signed)

## 2023-06-21 NOTE — Progress Notes (Signed)
 Progress Note   Patient: Maria Fox ZOX:[id number] DOB: 1945-08-29 DOA: 06/11/2023     0 DOS: the patient was seen and examined on 06/21/2023   Brief hospital course: 78yo with h/o CAD s/p CABG, chronic HFpEF, stage 3 CKD, DM, depression, and afib who presented on 4/2 with diarrhea and abdominal pain.  She was previously held in the ER from 3/19-28 for SI with daily psychiatry input.  CT negative but noted to have worsening renal function, resolved with IVF.  +UTI with Enterobacter, Proteus, treated with Ceftriaxone -> Cefepime.  Remains with SI, has Recruitment consultant, psych consulted.  Will dc to inpatient psych.  Assessment and Plan: * Suicidal ideation Patient complaining of depressed mood with suicidal ideation. Patient voices plan to kill herself by overdosing on medications as soon as she is discharged. Patient will not contract for safety however patient is willing to remain in the hospital therefore IVC has not been placed Order for psychiatry consultation placed Suicide precautions, will order sitter. Of note, [facility] until 3/28 for suicidal ideations.  Patient was rounded on daily by psychiatry until discharge.  Acute diarrhea Diarrhea resolved  CAD, multiple vessel s/p CABG x 3 Patient is currently chest pain free Monitoring patient on telemetry Continue home regimen of antiplatelet therapy, lipid lowering therapy and AV nodal blocking therapy   Acute renal failure superimposed on stage 3a chronic kidney disease ([name]) Creatinine now improved. Strict input and output monitoring Monitoring renal function and electrolytes with serial chemistries Avoiding nephrotoxic agents if at all possible   Acute cystitis without hematuria Urine cultures growing out Proteus mirabilis and Enterobacter cloacae  Antibiotics switched from intravenous ceftriaxone initiated on 4/4 to intravenous cefepime.   Patient continuing to  complain of severe lower abdominal pain, hopefully pain will improve with appropriate intravenous antibiotic therapy.  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 Diabetic Diet   Paroxysmal atrial fibrillation ([name]) Rate controlled and currently normal sinus rhythm Continue home regimen of Eliquis Continue home regimen of metoprolol  Hypokalemia Resolved Monitoring potassium levels with serial chemistries.   Mixed diabetic hyperlipidemia associated with type 2 diabetes mellitus ([name]) Continuing home regimen of lipid lowering therapy.   Essential hypertension Continue metoprolol PRN intravenous antihypertensives for excessively elevated blood pressure       {Tip this will not be part of the note when signed Body mass index is 23.43 kg/m. , ,  (Optional):26781}  Subjective: ***  Physical Exam: Vitals:   06/20/23 1307 06/20/23 1913 06/21/23 0428 06/21/23 1207  BP: 109/63 129/70 (!) 140/75 97/61  Pulse: 65 64 67 63  Resp: 17 20 15 18   Temp: 98 F (36.7 C) (!) 97.5 F (36.4 C) 98.1 F (36.7 C) (!) 97.5 F (36.4 C)  TempSrc: Oral Oral  Oral  SpO2: 96% 99% 97% 97%  Weight:      Height:       *** Data Reviewed: {Tip this will not be part of the note when signed- Document your independent interpretation of telemetry tracing, EKG, lab, Radiology test or any other diagnostic tests. Add any new diagnostic test ordered today. (Optional):26781} {Results:26384}  Family Communication: ***  Disposition: Status is: Observation {Observation:23811}  Planned Discharge Destination: {DISCHARGE DESTINATION_TRH:27031} {Tip this will not be part of the note  when signed  DVT Prophylaxis  .Apixaban (eliquis) tablet 5 mg ,  (Optional):26781}   Time spent: *** minutes  Author: Breklyn Fabrizio, DO 06/21/2023 4:50 PM  For on call review [email or web address].

## 2023-06-21 NOTE — Progress Notes (Signed)
 Mobility Specialist - Progress Note   06/21/23 1031  Mobility  Activity Ambulated with assistance to bathroom;Ambulated with assistance in hallway  Level of Assistance Standby assist, set-up cues, supervision of patient - no hands on  Assistive Device Front wheel walker  Distance Ambulated (ft) 350 ft  Range of Motion/Exercises Active  Activity Response Tolerated well  Mobility Referral Yes  Mobility visit 1 Mobility  Mobility Specialist Start Time (ACUTE ONLY) 1015  Mobility Specialist Stop Time (ACUTE ONLY) 1031  Mobility Specialist Time Calculation (min) (ACUTE ONLY) 16 min   Pt was found on recliner chair and agreeable to ambulate after encouragement. No complaints with session. At EOS returned to recliner chair with all needs met. Call bell in reach.  Lorna Rose Mobility Specialist

## 2023-06-21 NOTE — Plan of Care (Signed)
  Problem: Education: Goal: Knowledge of General Education information will improve Description: Including pain rating scale, medication(s)/side effects and non-pharmacologic comfort measures Outcome: Progressing   Problem: Clinical Measurements: Goal: Ability to maintain clinical measurements within normal limits will improve Outcome: Progressing Goal: Will remain free from infection Outcome: Progressing Goal: Diagnostic test results will improve Outcome: Progressing Goal: Respiratory complications will improve Outcome: Progressing Goal: Cardiovascular complication will be avoided Outcome: Progressing   Problem: Activity: Goal: Risk for activity intolerance will decrease Outcome: Progressing   Problem: Nutrition: Goal: Adequate nutrition will be maintained Outcome: Progressing   Problem: Elimination: Goal: Will not experience complications related to bowel motility Outcome: Progressing Goal: Will not experience complications related to urinary retention Outcome: Progressing   Problem: Pain Managment: Goal: General experience of comfort will improve and/or be controlled Outcome: Progressing   Problem: Safety: Goal: Ability to remain free from injury will improve Outcome: Progressing   Problem: Skin Integrity: Goal: Risk for impaired skin integrity will decrease Outcome: Progressing   Problem: Education: Goal: Ability to describe self-care measures that may prevent or decrease complications (Diabetes Survival Skills Education) will improve Outcome: Progressing Goal: Individualized Educational Video(s) Outcome: Progressing   Problem: Fluid Volume: Goal: Ability to maintain a balanced intake and output will improve Outcome: Progressing   Problem: Health Behavior/Discharge Planning: Goal: Ability to identify and utilize available resources and services will improve Outcome: Progressing Goal: Ability to manage health-related needs will improve Outcome:  Progressing   Problem: Metabolic: Goal: Ability to maintain appropriate glucose levels will improve Outcome: Progressing   Problem: Nutritional: Goal: Maintenance of adequate nutrition will improve Outcome: Progressing Goal: Progress toward achieving an optimal weight will improve Outcome: Progressing   Problem: Skin Integrity: Goal: Risk for impaired skin integrity will decrease Outcome: Progressing   Problem: Tissue Perfusion: Goal: Adequacy of tissue perfusion will improve Outcome: Progressing   Problem: Health Behavior/Discharge Planning: Goal: Ability to manage health-related needs will improve Outcome: Not Progressing   Problem: Coping: Goal: Level of anxiety will decrease Outcome: Not Progressing   Problem: Coping: Goal: Ability to adjust to condition or change in health will improve Outcome: Not Progressing

## 2023-06-22 DIAGNOSIS — Z79899 Other long term (current) drug therapy: Secondary | ICD-10-CM | POA: Diagnosis not present

## 2023-06-22 DIAGNOSIS — Z951 Presence of aortocoronary bypass graft: Secondary | ICD-10-CM | POA: Diagnosis not present

## 2023-06-22 DIAGNOSIS — R45851 Suicidal ideations: Secondary | ICD-10-CM | POA: Diagnosis not present

## 2023-06-22 DIAGNOSIS — N1832 Chronic kidney disease, stage 3b: Secondary | ICD-10-CM | POA: Diagnosis not present

## 2023-06-22 DIAGNOSIS — I130 Hypertensive heart and chronic kidney disease with heart failure and stage 1 through stage 4 chronic kidney disease, or unspecified chronic kidney disease: Secondary | ICD-10-CM | POA: Diagnosis not present

## 2023-06-22 DIAGNOSIS — K5900 Constipation, unspecified: Secondary | ICD-10-CM | POA: Diagnosis not present

## 2023-06-22 DIAGNOSIS — I2510 Atherosclerotic heart disease of native coronary artery without angina pectoris: Secondary | ICD-10-CM | POA: Diagnosis not present

## 2023-06-22 DIAGNOSIS — E782 Mixed hyperlipidemia: Secondary | ICD-10-CM | POA: Diagnosis not present

## 2023-06-22 DIAGNOSIS — Z1152 Encounter for screening for COVID-19: Secondary | ICD-10-CM | POA: Diagnosis not present

## 2023-06-22 DIAGNOSIS — Z794 Long term (current) use of insulin: Secondary | ICD-10-CM | POA: Diagnosis not present

## 2023-06-22 DIAGNOSIS — N3000 Acute cystitis without hematuria: Secondary | ICD-10-CM | POA: Diagnosis not present

## 2023-06-22 DIAGNOSIS — N179 Acute kidney failure, unspecified: Secondary | ICD-10-CM | POA: Diagnosis not present

## 2023-06-22 DIAGNOSIS — E876 Hypokalemia: Secondary | ICD-10-CM | POA: Diagnosis not present

## 2023-06-22 DIAGNOSIS — E1169 Type 2 diabetes mellitus with other specified complication: Secondary | ICD-10-CM | POA: Diagnosis not present

## 2023-06-22 DIAGNOSIS — I480 Paroxysmal atrial fibrillation: Secondary | ICD-10-CM | POA: Diagnosis not present

## 2023-06-22 DIAGNOSIS — E1165 Type 2 diabetes mellitus with hyperglycemia: Secondary | ICD-10-CM | POA: Diagnosis not present

## 2023-06-22 DIAGNOSIS — K219 Gastro-esophageal reflux disease without esophagitis: Secondary | ICD-10-CM | POA: Diagnosis not present

## 2023-06-22 DIAGNOSIS — E1122 Type 2 diabetes mellitus with diabetic chronic kidney disease: Secondary | ICD-10-CM | POA: Diagnosis not present

## 2023-06-22 DIAGNOSIS — I5030 Unspecified diastolic (congestive) heart failure: Secondary | ICD-10-CM | POA: Diagnosis not present

## 2023-06-22 DIAGNOSIS — I69911 Memory deficit following unspecified cerebrovascular disease: Secondary | ICD-10-CM | POA: Diagnosis not present

## 2023-06-22 DIAGNOSIS — E113293 Type 2 diabetes mellitus with mild nonproliferative diabetic retinopathy without macular edema, bilateral: Secondary | ICD-10-CM | POA: Diagnosis not present

## 2023-06-22 LAB — CBC WITH DIFFERENTIAL/PLATELET
Abs Immature Granulocytes: 0.03 10*3/uL (ref 0.00–0.07)
Basophils Absolute: 0.1 10*3/uL (ref 0.0–0.1)
Basophils Relative: 2 %
Eosinophils Absolute: 0.5 10*3/uL (ref 0.0–0.5)
Eosinophils Relative: 7 %
HCT: 37.6 % (ref 36.0–46.0)
Hemoglobin: 11.7 g/dL — ABNORMAL LOW (ref 12.0–15.0)
Immature Granulocytes: 1 %
Lymphocytes Relative: 31 %
Lymphs Abs: 1.9 10*3/uL (ref 0.7–4.0)
MCH: 28.9 pg (ref 26.0–34.0)
MCHC: 31.1 g/dL (ref 30.0–36.0)
MCV: 92.8 fL (ref 80.0–100.0)
Monocytes Absolute: 0.5 10*3/uL (ref 0.1–1.0)
Monocytes Relative: 8 %
Neutro Abs: 3.2 10*3/uL (ref 1.7–7.7)
Neutrophils Relative %: 51 %
Platelets: 274 10*3/uL (ref 150–400)
RBC: 4.05 MIL/uL (ref 3.87–5.11)
RDW: 14.2 % (ref 11.5–15.5)
WBC: 6.2 10*3/uL (ref 4.0–10.5)
nRBC: 0 % (ref 0.0–0.2)

## 2023-06-22 LAB — BASIC METABOLIC PANEL WITH GFR
Anion gap: 10 (ref 5–15)
BUN: 31 mg/dL — ABNORMAL HIGH (ref 8–23)
CO2: 19 mmol/L — ABNORMAL LOW (ref 22–32)
Calcium: 9.1 mg/dL (ref 8.9–10.3)
Chloride: 100 mmol/L (ref 98–111)
Creatinine, Ser: 1.37 mg/dL — ABNORMAL HIGH (ref 0.44–1.00)
GFR, Estimated: 39 mL/min — ABNORMAL LOW (ref >60)
Glucose, Bld: 218 mg/dL — ABNORMAL HIGH (ref 70–99)
Potassium: 5.5 mmol/L — ABNORMAL HIGH (ref 3.5–5.1)
Sodium: 129 mmol/L — ABNORMAL LOW (ref 135–145)

## 2023-06-22 LAB — GLUCOSE, CAPILLARY
Glucose-Capillary: 226 mg/dL — ABNORMAL HIGH (ref 70–99)
Glucose-Capillary: 416 mg/dL — ABNORMAL HIGH (ref 70–99)
Glucose-Capillary: 111 mg/dL — ABNORMAL HIGH (ref 70–99)
Glucose-Capillary: 164 mg/dL — ABNORMAL HIGH (ref 70–99)

## 2023-06-22 MED ORDER — INSULIN GLARGINE-YFGN 100 UNIT/ML ~~LOC~~ SOLN
10.0000 [IU] | Freq: Every day | SUBCUTANEOUS | Status: DC
  Administered 2023-06-22 – 2023-06-24 (×3): 10 [IU] via SUBCUTANEOUS
  Filled 2023-06-22 (×3): qty 0.1

## 2023-06-22 NOTE — Plan of Care (Signed)
  Problem: Education: Goal: Knowledge of General Education information will improve Description: Including pain rating scale, medication(s)/side effects and non-pharmacologic comfort measures Outcome: Progressing   Problem: Health Behavior/Discharge Planning: Goal: Ability to manage health-related needs will improve Outcome: Progressing   Problem: Clinical Measurements: Goal: Ability to maintain clinical measurements within normal limits will improve Outcome: Progressing Goal: Will remain free from infection Outcome: Progressing Goal: Diagnostic test results will improve Outcome: Progressing Goal: Respiratory complications will improve Outcome: Progressing Goal: Cardiovascular complication will be avoided Outcome: Progressing   Problem: Nutrition: Goal: Adequate nutrition will be maintained Outcome: Progressing   Problem: Elimination: Goal: Will not experience complications related to bowel motility Outcome: Progressing Goal: Will not experience complications related to urinary retention Outcome: Progressing   Problem: Pain Managment: Goal: General experience of comfort will improve and/or be controlled Outcome: Progressing   Problem: Safety: Goal: Ability to remain free from injury will improve Outcome: Progressing   Problem: Skin Integrity: Goal: Risk for impaired skin integrity will decrease Outcome: Progressing   Problem: Education: Goal: Ability to describe self-care measures that may prevent or decrease complications (Diabetes Survival Skills Education) will improve Outcome: Progressing Goal: Individualized Educational Video(s) Outcome: Progressing   Problem: Fluid Volume: Goal: Ability to maintain a balanced intake and output will improve Outcome: Progressing   Problem: Health Behavior/Discharge Planning: Goal: Ability to identify and utilize available resources and services will improve Outcome: Progressing Goal: Ability to manage health-related needs  will improve Outcome: Progressing   Problem: Metabolic: Goal: Ability to maintain appropriate glucose levels will improve Outcome: Progressing   Problem: Nutritional: Goal: Maintenance of adequate nutrition will improve Outcome: Progressing Goal: Progress toward achieving an optimal weight will improve Outcome: Progressing   Problem: Skin Integrity: Goal: Risk for impaired skin integrity will decrease Outcome: Progressing   Problem: Tissue Perfusion: Goal: Adequacy of tissue perfusion will improve Outcome: Progressing   Problem: Activity: Goal: Risk for activity intolerance will decrease Outcome: Not Progressing   Problem: Coping: Goal: Level of anxiety will decrease Outcome: Not Progressing   Problem: Coping: Goal: Ability to adjust to condition or change in health will improve Outcome: Not Progressing

## 2023-06-22 NOTE — Progress Notes (Signed)
 Progress Note   Patient: Maria Fox:[id number] DOB: 07-16-1945 DOA: 06/11/2023     0 DOS: the patient was seen and examined on 06/22/2023   Brief hospital course: 78yo with h/o CAD s/p CABG, chronic HFpEF, stage 3 CKD, DM, depression, and afib who presented on 4/2 with diarrhea and abdominal pain.  She was previously held in the ER from 3/19-28 for SI with daily psychiatry input.  CT negative but noted to have worsening renal function, resolved with IVF.  +UTI with Enterobacter, Proteus, treated with Ceftriaxone -> Cefepime.  Remains with SI, has Recruitment consultant, psych consulted.    Psychiatry had recommended inpatient psych, but on 06/21/2023 they stated that the patient could be discharged to family as she was no longer a risk to herself or others. However, it seems that the patient has not had a BM in 4 days. She has been given Lactulose, Dulcolax suppository, and colace.   The patient will be ready to discharge once she has a BM.  Assessment and Plan: * Suicidal ideation Patient complaining of depressed mood with suicidal ideation. Patient voices plan to kill herself by overdosing on medications as soon as she is discharged. Patient will not contract for safety however patient is willing to remain in the hospital therefore IVC has not been placed Order for psychiatry consultation placed Suicide precautions, will order sitter. Of note, [facility] until 3/28 for suicidal ideations.  Patient was rounded on daily by psychiatry until discharge. On 06/21/2023 psychiatry stated that the patient was no longer a threat to herself or others and that from a psychiatric perspective she was appropriate for discharge to home with family.  Acute diarrhea/Constipation Resolved.  Now the patient has not had a BM x 4 days.  CAD, multiple vessel s/p CABG x 3 Patient is currently chest pain free Monitoring patient on telemetry Continue home  regimen of antiplatelet therapy, lipid lowering therapy and AV nodal blocking therapy   Acute renal failure superimposed on stage 3a chronic kidney disease ([name]) Creatinine now improved. Strict input and output monitoring Monitoring renal function and electrolytes with serial chemistries Avoiding nephrotoxic agents if at all possible   Acute cystitis without hematuria Urine cultures growing out Proteus mirabilis and Enterobacter cloacae  Antibiotics switched from intravenous ceftriaxone initiated on 4/4 to intravenous cefepime.   Patient continuing to complain of severe lower abdominal pain, hopefully pain will improve with appropriate intravenous antibiotic therapy.  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 Diabetic Diet   Paroxysmal atrial fibrillation ([name]) Rate controlled and currently normal sinus rhythm Continue home regimen of Eliquis Continue home regimen of metoprolol  Hypokalemia Resolved Monitoring potassium levels with serial chemistries.   Mixed diabetic hyperlipidemia associated with type 2 diabetes mellitus ([name]) Continuing home regimen of lipid lowering therapy.   Essential hypertension Continue metoprolol PRN intravenous antihypertensives for excessively elevated blood pressure      {Tip this will not be part of the note when signed Body mass index is 23.43 kg/m. , ,  (Optional):26781}  Subjective: The patient is lying in bed, complaining of abdominal pain. She states that she has not had a BM now for 4 days and that she  feels very sick. She states that she is too sick to discharge today.  Physical Exam: Vitals:   06/21/23  1900 06/22/23 0446 06/22/23 0844 06/22/23 1430  BP: 118/76 138/63 117/61 109/63  Pulse: 68 68 67 69  Resp: 20 18  18   Temp: 97.7 F (36.5 C) 97.9 F (36.6 C)  98.3 F (36.8 C)  TempSrc: Oral Oral    SpO2: 100% 98% 100% 98%  Weight:      Height:       Exam:  Constitutional:  The patient is awake, alert, and oriented x 3. No acute distress. Respiratory:  No increased work of breathing. No wheezes, rales, or rhonchi No tactile fremitus Cardiovascular:  Regular rate and rhythm No murmurs, ectopy, or gallups. No lateral PMI. No thrills. Abdomen:  Abdomen is distended and diffusely tender No hernias, masses, or organomegaly Normoactive bowel sounds.  Musculoskeletal:  No cyanosis, clubbing, or edema Skin:  No rashes, lesions, ulcers palpation of skin: no induration or nodules Neurologic:  CN 2-12 intact Sensation all 4 extremities intact Psychiatric:  Mental status Mood, affect appropriate Orientation to person, place, time  judgment and insight appear intact  Data Reviewed:  CBC BMP  Family Communication: None available  Disposition: Status is: Observation The patient remains OBS appropriate and will d/c before 2 midnights.  Planned Discharge Destination: Home {Tip this will not be part of the note when signed  DVT Prophylaxis  .Apixaban (eliquis) tablet 5 mg ,  (Optional):26781}   Time spent: 34 minutes  Author: Junita Oliva, DO 06/22/2023 6:48 PM  For on call review [email or web address].

## 2023-06-22 NOTE — Plan of Care (Signed)

## 2023-06-22 NOTE — Progress Notes (Signed)
 Mobility Specialist - Progress Note   06/22/23 0921  Mobility  Activity Ambulated with assistance in hallway;Ambulated with assistance to bathroom  Level of Assistance Modified independent, requires aide device or extra time  Assistive Device Front wheel walker  Distance Ambulated (ft) 450 ft  Range of Motion/Exercises Active  Activity Response Tolerated well  Mobility Referral Yes  Mobility visit 1 Mobility  Mobility Specialist Start Time (ACUTE ONLY) 0910  Mobility Specialist Stop Time (ACUTE ONLY) [id number]  Mobility Specialist Time Calculation (min) (ACUTE ONLY) 11 min   Pt was found on recliner chair and agreeable to ambulate. No complaints with session and at EOS returned to recliner chair with all needs met. Call bell in reach.  Lorna Rose Mobility Specialist

## 2023-06-23 ENCOUNTER — Observation Stay (HOSPITAL_COMMUNITY)

## 2023-06-23 DIAGNOSIS — K5900 Constipation, unspecified: Secondary | ICD-10-CM | POA: Diagnosis not present

## 2023-06-23 DIAGNOSIS — E876 Hypokalemia: Secondary | ICD-10-CM | POA: Diagnosis not present

## 2023-06-23 DIAGNOSIS — Z794 Long term (current) use of insulin: Secondary | ICD-10-CM | POA: Diagnosis not present

## 2023-06-23 DIAGNOSIS — I2510 Atherosclerotic heart disease of native coronary artery without angina pectoris: Secondary | ICD-10-CM | POA: Diagnosis not present

## 2023-06-23 DIAGNOSIS — R45851 Suicidal ideations: Secondary | ICD-10-CM | POA: Diagnosis not present

## 2023-06-23 DIAGNOSIS — I5030 Unspecified diastolic (congestive) heart failure: Secondary | ICD-10-CM | POA: Diagnosis not present

## 2023-06-23 DIAGNOSIS — N179 Acute kidney failure, unspecified: Secondary | ICD-10-CM | POA: Diagnosis not present

## 2023-06-23 DIAGNOSIS — I130 Hypertensive heart and chronic kidney disease with heart failure and stage 1 through stage 4 chronic kidney disease, or unspecified chronic kidney disease: Secondary | ICD-10-CM | POA: Diagnosis not present

## 2023-06-23 DIAGNOSIS — N3000 Acute cystitis without hematuria: Secondary | ICD-10-CM | POA: Diagnosis not present

## 2023-06-23 DIAGNOSIS — E1169 Type 2 diabetes mellitus with other specified complication: Secondary | ICD-10-CM | POA: Diagnosis not present

## 2023-06-23 DIAGNOSIS — E782 Mixed hyperlipidemia: Secondary | ICD-10-CM | POA: Diagnosis not present

## 2023-06-23 DIAGNOSIS — E1165 Type 2 diabetes mellitus with hyperglycemia: Secondary | ICD-10-CM | POA: Diagnosis not present

## 2023-06-23 DIAGNOSIS — Z79899 Other long term (current) drug therapy: Secondary | ICD-10-CM | POA: Diagnosis not present

## 2023-06-23 DIAGNOSIS — R197 Diarrhea, unspecified: Secondary | ICD-10-CM | POA: Diagnosis not present

## 2023-06-23 DIAGNOSIS — Z951 Presence of aortocoronary bypass graft: Secondary | ICD-10-CM | POA: Diagnosis not present

## 2023-06-23 DIAGNOSIS — Z1152 Encounter for screening for COVID-19: Secondary | ICD-10-CM | POA: Diagnosis not present

## 2023-06-23 DIAGNOSIS — N1832 Chronic kidney disease, stage 3b: Secondary | ICD-10-CM | POA: Diagnosis not present

## 2023-06-23 DIAGNOSIS — E113293 Type 2 diabetes mellitus with mild nonproliferative diabetic retinopathy without macular edema, bilateral: Secondary | ICD-10-CM | POA: Diagnosis not present

## 2023-06-23 DIAGNOSIS — R1030 Lower abdominal pain, unspecified: Secondary | ICD-10-CM | POA: Diagnosis not present

## 2023-06-23 DIAGNOSIS — E1122 Type 2 diabetes mellitus with diabetic chronic kidney disease: Secondary | ICD-10-CM | POA: Diagnosis not present

## 2023-06-23 DIAGNOSIS — K219 Gastro-esophageal reflux disease without esophagitis: Secondary | ICD-10-CM | POA: Diagnosis not present

## 2023-06-23 DIAGNOSIS — I69911 Memory deficit following unspecified cerebrovascular disease: Secondary | ICD-10-CM | POA: Diagnosis not present

## 2023-06-23 DIAGNOSIS — I480 Paroxysmal atrial fibrillation: Secondary | ICD-10-CM | POA: Diagnosis not present

## 2023-06-23 LAB — GLUCOSE, CAPILLARY
Glucose-Capillary: 217 mg/dL — ABNORMAL HIGH (ref 70–99)
Glucose-Capillary: 327 mg/dL — ABNORMAL HIGH (ref 70–99)
Glucose-Capillary: 73 mg/dL (ref 70–99)
Glucose-Capillary: 172 mg/dL — ABNORMAL HIGH (ref 70–99)

## 2023-06-23 MED ORDER — LACTULOSE 10 GM/15ML PO SOLN
30.0000 g | Freq: Three times a day (TID) | ORAL | Status: DC
  Administered 2023-06-23 – 2023-06-24 (×4): 30 g via ORAL
  Filled 2023-06-23 (×4): qty 45

## 2023-06-23 MED ORDER — BISACODYL 10 MG RE SUPP
10.0000 mg | Freq: Once | RECTAL | Status: AC
  Administered 2023-06-23: 10 mg via RECTAL
  Filled 2023-06-23: qty 1

## 2023-06-23 NOTE — Plan of Care (Signed)
  Problem: Education: Goal: Knowledge of General Education information will improve Description: Including pain rating scale, medication(s)/side effects and non-pharmacologic comfort measures Outcome: Progressing   Problem: Health Behavior/Discharge Planning: Goal: Ability to manage health-related needs will improve Outcome: Progressing   Problem: Clinical Measurements: Goal: Ability to maintain clinical measurements within normal limits will improve Outcome: Progressing Goal: Will remain free from infection Outcome: Progressing Goal: Diagnostic test results will improve Outcome: Progressing Goal: Respiratory complications will improve Outcome: Progressing Goal: Cardiovascular complication will be avoided Outcome: Progressing   Problem: Activity: Goal: Risk for activity intolerance will decrease Outcome: Progressing   Problem: Nutrition: Goal: Adequate nutrition will be maintained Outcome: Progressing   Problem: Elimination: Goal: Will not experience complications related to urinary retention Outcome: Progressing   Problem: Safety: Goal: Ability to remain free from injury will improve Outcome: Progressing   Problem: Skin Integrity: Goal: Risk for impaired skin integrity will decrease Outcome: Progressing   Problem: Education: Goal: Ability to describe self-care measures that may prevent or decrease complications (Diabetes Survival Skills Education) will improve Outcome: Progressing Goal: Individualized Educational Video(s) Outcome: Progressing   Problem: Fluid Volume: Goal: Ability to maintain a balanced intake and output will improve Outcome: Progressing   Problem: Health Behavior/Discharge Planning: Goal: Ability to identify and utilize available resources and services will improve Outcome: Progressing Goal: Ability to manage health-related needs will improve Outcome: Progressing   Problem: Metabolic: Goal: Ability to maintain appropriate glucose levels  will improve Outcome: Progressing   Problem: Nutritional: Goal: Maintenance of adequate nutrition will improve Outcome: Progressing Goal: Progress toward achieving an optimal weight will improve Outcome: Progressing   Problem: Skin Integrity: Goal: Risk for impaired skin integrity will decrease Outcome: Progressing   Problem: Tissue Perfusion: Goal: Adequacy of tissue perfusion will improve Outcome: Progressing   Problem: Coping: Goal: Level of anxiety will decrease Outcome: Not Progressing   Problem: Elimination: Goal: Will not experience complications related to bowel motility Outcome: Not Progressing   Problem: Pain Managment: Goal: General experience of comfort will improve and/or be controlled Outcome: Not Progressing   Problem: Coping: Goal: Ability to adjust to condition or change in health will improve Outcome: Not Progressing

## 2023-06-23 NOTE — Progress Notes (Signed)
 Pt given Ducolax supp.   Pt successful with incontinent large bowel movement.

## 2023-06-23 NOTE — Plan of Care (Signed)

## 2023-06-23 NOTE — Progress Notes (Signed)
 Mobility Specialist - Progress Note   06/23/23 0929  Mobility  Activity Ambulated with assistance in hallway  Level of Assistance Standby assist, set-up cues, supervision of patient - no hands on  Assistive Device Front wheel walker  Distance Ambulated (ft) 200 ft  Range of Motion/Exercises Active  Activity Response Tolerated fair  Mobility Referral Yes  Mobility visit 1 Mobility  Mobility Specialist Start Time (ACUTE ONLY) 0919  Mobility Specialist Stop Time (ACUTE ONLY) 0929  Mobility Specialist Time Calculation (min) (ACUTE ONLY) 10 min   Pt was found on recliner chair and agreeable to ambulate. C/o headache and abdominal pain. At EOS returned to recliner chair with all needs met. Call bell in reach and chair alarm on.  Lorna Rose Mobility Specialist

## 2023-06-23 NOTE — Progress Notes (Signed)
 Progress Note   Patient: KILLIAN RESS ZOX:[id number] DOB: 04-09-1945 DOA: 06/11/2023     0 DOS: the patient was seen and examined on 06/23/2023   Brief hospital course: 78yo with h/o CAD s/p CABG, chronic HFpEF, stage 3 CKD, DM, depression, and afib who presented on 4/2 with diarrhea and abdominal pain.  She was previously held in the ER from 3/19-28 for SI with daily psychiatry input.  CT negative but noted to have worsening renal function, resolved with IVF.  +UTI with Enterobacter, Proteus, treated with Ceftriaxone -> Cefepime.  Remains with SI, has Recruitment consultant, psych consulted.    Psychiatry had recommended inpatient psych, but on 06/21/2023 they stated that the patient could be discharged to family as she was no longer a risk to herself or others. However, it seems that the patient has not had a BM in 4 days. She has been given Lactulose, Dulcolax suppository, and colace.   The patient will be ready to discharge once she has a BM.   Assessment and Plan: * Suicidal ideation Patient complaining of depressed mood with suicidal ideation. Patient voices plan to kill herself by overdosing on medications as soon as she is discharged. Patient will not contract for safety however patient is willing to remain in the hospital therefore IVC has not been placed Order for psychiatry consultation placed Suicide precautions, will order sitter. Of note, patient was recently held in the less along emergency department from 3/19 until 3/28 for suicidal ideations.  Patient was rounded on daily by psychiatry until discharge. On 06/21/2023 psychiatry stated that the patient was no longer a threat to herself or others and that from a psychiatric perspective she was appropriate for discharge to home with family.  Acute diarrhea/Constipation Diarrhea on presentation. None since.  Now the patient has not had a BM x 4 days. She is receiving lactulose and bisacodyl suppository for this.  Enema tomorrow if no  results.  CAD, multiple vessel s/p CABG x 3 Patient is currently chest pain free Monitoring patient on telemetry Continue home regimen of antiplatelet therapy, lipid lowering therapy and AV nodal blocking therapy  Acute renal failure superimposed on stage 3a chronic kidney disease ([name]) Creatinine now improved. Strict input and output monitoring Monitoring renal function and electrolytes with serial chemistries Avoiding nephrotoxic agents if at all possible  Acute cystitis without hematuria Urine cultures growing out Proteus mirabilis and Enterobacter cloacae  Antibiotics switched from intravenous ceftriaxone initiated on 4/4 to intravenous cefepime.   Patient continuing to complain of severe lower abdominal pain, hopefully pain will improve with appropriate intravenous antibiotic therapy.  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 Diabetic Diet  Paroxysmal atrial fibrillation ([name]) Rate controlled and currently normal sinus rhythm Continue home regimen of Eliquis Continue home regimen of metoprolol  Hypokalemia Resolved Monitoring potassium levels with serial chemistries.  Mixed diabetic hyperlipidemia associated with type 2 diabetes mellitus ([name]) Continuing home regimen of lipid lowering therapy.  Essential hypertension Continue metoprolol PRN intravenous antihypertensives for excessively elevated blood pressure   {Tip this will not be part of the note when signed Body mass index is 23.43 kg/m. , ,  (Optional):26781}  Subjective: The patient continues to complain of abdominal pain. She states that she still has  not had BM.   Physical  Exam: Vitals:   06/22/23 1938 06/23/23 0452 06/23/23 1147 06/23/23 1927  BP: 108/64 126/71 119/66 109/61  Pulse: 64 65 (!) 59 65  Resp: 15 16 15 16   Temp: 97.7 F (36.5 C) 98.3 F (36.8 C) 97.8 F (36.6 C) (!) 97.5 F (36.4 C)  TempSrc: Oral  Oral   SpO2: 99% 96% 98% 96%  Weight:      Height:       Exam:  Constitutional:  The patient is awake, alert, and oriented x 3. No acute distress. Respiratory:  No increased work of breathing. No wheezes, rales, or rhonchi No tactile fremitus Cardiovascular:  Regular rate and rhythm No murmurs, ectopy, or gallups. No lateral PMI. No thrills. Abdomen:  Abdomen is distended and diffusely tender No hernias, masses, or organomegaly Normoactive bowel sounds.  Musculoskeletal:  No cyanosis, clubbing, or edema Skin:  No rashes, lesions, ulcers palpation of skin: no induration or nodules Neurologic:  CN 2-12 intact Sensation all 4 extremities intact Psychiatric:  Mental status Mood, affect appropriate Orientation to person, place, time  judgment and insight appear intact  Data Reviewed:  CBC BMP  Family Communication: None available  Disposition: Status is: Observation The patient remains OBS appropriate and will d/c before 2 midnights.  Planned Discharge Destination: Home {Tip this will not be part of the note when signed  DVT Prophylaxis  .Apixaban (eliquis) tablet 5 mg ,  (Optional):26781}   Time spent: 34 minutes  Author: Samanvitha Germany, DO 06/23/2023 8:23 PM  For on call review [email or web address].

## 2023-06-24 ENCOUNTER — Other Ambulatory Visit (HOSPITAL_COMMUNITY): Payer: Self-pay

## 2023-06-24 DIAGNOSIS — K5900 Constipation, unspecified: Secondary | ICD-10-CM | POA: Diagnosis not present

## 2023-06-24 DIAGNOSIS — I5030 Unspecified diastolic (congestive) heart failure: Secondary | ICD-10-CM | POA: Diagnosis not present

## 2023-06-24 DIAGNOSIS — E782 Mixed hyperlipidemia: Secondary | ICD-10-CM | POA: Diagnosis not present

## 2023-06-24 DIAGNOSIS — Z951 Presence of aortocoronary bypass graft: Secondary | ICD-10-CM | POA: Diagnosis not present

## 2023-06-24 DIAGNOSIS — Z794 Long term (current) use of insulin: Secondary | ICD-10-CM | POA: Diagnosis not present

## 2023-06-24 DIAGNOSIS — N1832 Chronic kidney disease, stage 3b: Secondary | ICD-10-CM | POA: Diagnosis not present

## 2023-06-24 DIAGNOSIS — N179 Acute kidney failure, unspecified: Secondary | ICD-10-CM | POA: Diagnosis not present

## 2023-06-24 DIAGNOSIS — Z1152 Encounter for screening for COVID-19: Secondary | ICD-10-CM | POA: Diagnosis not present

## 2023-06-24 DIAGNOSIS — E1122 Type 2 diabetes mellitus with diabetic chronic kidney disease: Secondary | ICD-10-CM | POA: Diagnosis not present

## 2023-06-24 DIAGNOSIS — I2510 Atherosclerotic heart disease of native coronary artery without angina pectoris: Secondary | ICD-10-CM | POA: Diagnosis not present

## 2023-06-24 DIAGNOSIS — I480 Paroxysmal atrial fibrillation: Secondary | ICD-10-CM | POA: Diagnosis not present

## 2023-06-24 DIAGNOSIS — Z79899 Other long term (current) drug therapy: Secondary | ICD-10-CM | POA: Diagnosis not present

## 2023-06-24 DIAGNOSIS — I130 Hypertensive heart and chronic kidney disease with heart failure and stage 1 through stage 4 chronic kidney disease, or unspecified chronic kidney disease: Secondary | ICD-10-CM | POA: Diagnosis not present

## 2023-06-24 DIAGNOSIS — E876 Hypokalemia: Secondary | ICD-10-CM | POA: Diagnosis not present

## 2023-06-24 DIAGNOSIS — N3000 Acute cystitis without hematuria: Secondary | ICD-10-CM | POA: Diagnosis not present

## 2023-06-24 DIAGNOSIS — K219 Gastro-esophageal reflux disease without esophagitis: Secondary | ICD-10-CM | POA: Diagnosis not present

## 2023-06-24 DIAGNOSIS — E1169 Type 2 diabetes mellitus with other specified complication: Secondary | ICD-10-CM | POA: Diagnosis not present

## 2023-06-24 DIAGNOSIS — E1165 Type 2 diabetes mellitus with hyperglycemia: Secondary | ICD-10-CM | POA: Diagnosis not present

## 2023-06-24 DIAGNOSIS — I69911 Memory deficit following unspecified cerebrovascular disease: Secondary | ICD-10-CM | POA: Diagnosis not present

## 2023-06-24 DIAGNOSIS — R45851 Suicidal ideations: Secondary | ICD-10-CM | POA: Diagnosis not present

## 2023-06-24 DIAGNOSIS — E113293 Type 2 diabetes mellitus with mild nonproliferative diabetic retinopathy without macular edema, bilateral: Secondary | ICD-10-CM | POA: Diagnosis not present

## 2023-06-24 LAB — BASIC METABOLIC PANEL WITH GFR
Anion gap: 10 (ref 5–15)
BUN: 37 mg/dL — ABNORMAL HIGH (ref 8–23)
CO2: 21 mmol/L — ABNORMAL LOW (ref 22–32)
Calcium: 9.1 mg/dL (ref 8.9–10.3)
Chloride: 101 mmol/L (ref 98–111)
Creatinine, Ser: 1.65 mg/dL — ABNORMAL HIGH (ref 0.44–1.00)
GFR, Estimated: 31 mL/min — ABNORMAL LOW (ref >60)
Glucose, Bld: 147 mg/dL — ABNORMAL HIGH (ref 70–99)
Potassium: 4.3 mmol/L (ref 3.5–5.1)
Sodium: 132 mmol/L — ABNORMAL LOW (ref 135–145)

## 2023-06-24 LAB — GLUCOSE, CAPILLARY
Glucose-Capillary: 187 mg/dL — ABNORMAL HIGH (ref 70–99)
Glucose-Capillary: 173 mg/dL — ABNORMAL HIGH (ref 70–99)
Glucose-Capillary: 199 mg/dL — ABNORMAL HIGH (ref 70–99)

## 2023-06-24 LAB — CBC WITH DIFFERENTIAL/PLATELET
Abs Immature Granulocytes: 0.04 10*3/uL (ref 0.00–0.07)
Basophils Absolute: 0.1 10*3/uL (ref 0.0–0.1)
Basophils Relative: 2 %
Eosinophils Absolute: 0.5 10*3/uL (ref 0.0–0.5)
Eosinophils Relative: 7 %
HCT: 35.7 % — ABNORMAL LOW (ref 36.0–46.0)
Hemoglobin: 11.6 g/dL — ABNORMAL LOW (ref 12.0–15.0)
Immature Granulocytes: 1 %
Lymphocytes Relative: 34 %
Lymphs Abs: 2.5 10*3/uL (ref 0.7–4.0)
MCH: 29.1 pg (ref 26.0–34.0)
MCHC: 32.5 g/dL (ref 30.0–36.0)
MCV: 89.7 fL (ref 80.0–100.0)
Monocytes Absolute: 0.5 10*3/uL (ref 0.1–1.0)
Monocytes Relative: 7 %
Neutro Abs: 3.8 10*3/uL (ref 1.7–7.7)
Neutrophils Relative %: 49 %
Platelets: 254 10*3/uL (ref 150–400)
RBC: 3.98 MIL/uL (ref 3.87–5.11)
RDW: 14.2 % (ref 11.5–15.5)
WBC: 7.6 10*3/uL (ref 4.0–10.5)
nRBC: 0 % (ref 0.0–0.2)

## 2023-06-24 NOTE — Progress Notes (Signed)
 Mobility Specialist - Progress Note   06/24/23 1419  Mobility  Activity Ambulated with assistance in hallway  Level of Assistance Modified independent, requires aide device or extra time  Assistive Device Front wheel walker  Distance Ambulated (ft) 500 ft  Activity Response Tolerated well  Mobility Referral Yes  Mobility visit 1 Mobility  Mobility Specialist Start Time (ACUTE ONLY) 1409  Mobility Specialist Stop Time (ACUTE ONLY) 1419  Mobility Specialist Time Calculation (min) (ACUTE ONLY) 10 min   Pt received in recliner and agreeable to mobility. No complaints during session. Pt to bed after session with all needs met.    [facility]

## 2023-06-24 NOTE — Discharge Summary (Signed)
 Physician Discharge Summary  Maria Fox:[id number] DOB: March 28, 1945 DOA: 06/11/2023  PCP: Eustaquio Boyden, MD  Admit date: 06/11/2023 Discharge date: 06/21/23  Admitted From: Home Disposition:  Home  Discharge Condition:Stable CODE STATUS:FULL Diet recommendation: Heart Healthy  Brief/Interim Summary: Patient is a 78 yo F with h/o CAD s/p CABG, chronic HFpEF, stage 3 CKD, DM, depression, and afib who presented on 4/2 with diarrhea and abdominal pain. She was previously held in the ER from 3/19-28 for SI with daily psychiatry input. CT negative but noted to have worsening renal function, resolved with IVF. +UTI with Enterobacter, Proteus, treated with Ceftriaxone -> Cefepime.Abx changed to  oral and she completed the course.  Psychiatry was following here, initially recommending inpatient psychiatry admission but now is cleared for discharge home.   The patient had not had a BM in 4 days as of 06/21/2023. After 2 days of lactulose, and enema and bisacodyl suppositories the patient has had several BM's over the last 24 hours.  She is appropriate for discharge to home.  Following problems were addressed during the hospitalization:  Suicidal ideation  - Psychiatry was following.  Initially recommended inpatient psychiatry, now cleared for home.  Recommended to continue Abilify, Cymbalta.  Also on Seroquel at home.  We recommend to follow-up with psychiatry as an outpatient.   Acute cystitis  - Urine culture showed Enterobacter, Proteus.  Completed a course of Bactrim.   AKI superimposed on 3b CKD  - Currently kidney function at baseline  PAF - Eliquis 5 mg PO bid to be continued  5. Constipation - The patient has had several BM's over the last 24 hours. She will di   CAD - Plavix 75 mg PO daily to be continued   HTN  - Her home medications include Imdur 15 mg PO daily ,Toprol XL 25 mg PO daily .  These medication will be continued   DM2 - She takes glipizide, metformin at  home.  Continue on discharge   HLD  - On lipitor 40 mg PO at bedtime    Glaucoma  - Resume home eye drops   Discharge Diagnoses:  Principal Problem:   Suicidal ideation Active Problems:   Acute diarrhea   CAD, multiple vessel s/p CABG x 3   Chronic kidney disease, stage 3a ([name])   Essential hypertension   Mixed diabetic hyperlipidemia associated with type 2 diabetes mellitus ([name])   Hypokalemia   Paroxysmal atrial fibrillation ([name])   Type 2 diabetes mellitus with mild nonproliferative diabetic retinopathy without macular edema, bilateral ([name])   Acute cystitis without hematuria   Memory deficit   Acute renal failure superimposed on stage 3a chronic kidney disease ([name]) Consitipation   Discharge Instructions  Discharge Instructions     Activity as tolerated - No restrictions   Complete by: As directed    Call MD for:  persistant nausea and vomiting   Complete by: As directed    Call MD for:  severe uncontrolled pain   Complete by: As directed    Diet - low sodium heart healthy   Complete by: As directed    Discharge instructions   Complete by: As directed    1)Please take your medications as instructed 2)Follow up with your psychiatrist, PCP   Discharge instructions   Complete by: As directed    Discharge to home Follow up with PCP in 7-10 days.   Increase activity slowly   Complete by: As directed    Increase activity slowly   Complete  by: As directed       Allergies as of 06/24/2023       Reactions   Bee Venom Anaphylaxis, Swelling   Mushroom Extract Complex (obsolete) Anaphylaxis   Penicillins Anaphylaxis, Hives, Swelling, Other (See Comments)   Tolerates cephalosporins including cephalexin multiple times.    Codeine Nausea And Vomiting   Sulfa Antibiotics Nausea And Vomiting   Iohexol Itching, Swelling, Other (See Comments)   Iohexol, sold under the trade name Omnipaque among others, is a contrast agent used for X-ray imaging.        Medication  List     TAKE these medications    acetaminophen 650 MG CR tablet Commonly known as: TYLENOL Take 1,300 mg by mouth every 6 (six) hours as needed for pain.   albuterol (2.5 MG/3ML) 0.083% nebulizer solution Commonly known as: PROVENTIL Inhale 3 mLs into the lungs every 6 (six) hours as needed for wheezing or shortness of breath.   albuterol 108 (90 Base) MCG/ACT inhaler Commonly known as: VENTOLIN HFA Inhale 1-2 puffs into the lungs every 6 (six) hours as needed for wheezing or shortness of breath.   apixaban 5 MG Tabs tablet Commonly known as: Eliquis Take 1 tablet (5 mg total) by mouth 2 (two) times daily.   ARIPiprazole 2 MG tablet Commonly known as: ABILIFY Take 1 tablet (2 mg total) by mouth daily.   atorvastatin 40 MG tablet Commonly known as: LIPITOR TAKE 1 TABLET BY MOUTH EVERY EVENING   clopidogrel 75 MG tablet Commonly known as: PLAVIX Take 1 tablet (75 mg total) by mouth daily.   dorzolamide-timolol 2-0.5 % ophthalmic solution Commonly known as: COSOPT Place 1 drop into both eyes 2 (two) times daily.   DULoxetine 60 MG capsule Commonly known as: CYMBALTA Take 1 capsule (60 mg total) by mouth at bedtime. What changed: when to take this   DULoxetine 30 MG capsule Commonly known as: CYMBALTA Take 1 capsule (30 mg total) by mouth daily. What changed: You were already taking a medication with the same name, and this prescription was added. Make sure you understand how and when to take each.   feeding supplement (GLUCERNA SHAKE) Liqd Take 237 mLs by mouth 3 (three) times daily between meals. What changed: when to take this   glipiZIDE 10 MG tablet Commonly known as: GLUCOTROL Take 1 tablet (10 mg total) by mouth 2 (two) times daily. Always take with food   hydrocortisone cream 1 % Apply topically 3 (three) times daily. What changed:  how much to take when to take this reasons to take this   isosorbide mononitrate 30 MG 24 hr tablet Commonly known  as: IMDUR Take 0.5 tablets (15 mg total) by mouth daily.   latanoprost 0.005 % ophthalmic solution Commonly known as: XALATAN Place 1 drop into both eyes at bedtime.   melatonin 5 MG Tabs Take 5 mg by mouth at bedtime as needed (sleep).   metFORMIN 500 MG 24 hr tablet Commonly known as: GLUCOPHAGE-XR Take 1 tablet (500 mg total) by mouth daily with breakfast.   metoprolol succinate 25 MG 24 hr tablet Commonly known as: TOPROL-XL Take 1 tablet (25 mg total) by mouth daily.   nitroGLYCERIN 0.4 MG SL tablet Commonly known as: NITROSTAT Place 0.4 mg under the tongue every 5 (five) minutes as needed for chest pain.   pantoprazole 40 MG tablet Commonly known as: PROTONIX Take 1 tablet (40 mg total) by mouth daily. What changed: when to take this   QUEtiapine 50  MG tablet Commonly known as: SEROQUEL Take 50 mg by mouth at bedtime.        Allergies  Allergen Reactions   Bee Venom Anaphylaxis and Swelling   Mushroom Extract Complex (Obsolete) Anaphylaxis   Penicillins Anaphylaxis, Hives, Swelling and Other (See Comments)    Tolerates cephalosporins including cephalexin multiple times.    Codeine Nausea And Vomiting   Sulfa Antibiotics Nausea And Vomiting   Iohexol Itching, Swelling and Other (See Comments)    Iohexol, sold under the trade name Omnipaque among others, is a contrast agent used for X-ray imaging.    Consultations: Psychiatry   Procedures/Studies: DG Abd 1 View Result Date: 06/23/2023 CLINICAL DATA:  Diarrhea with low abdominal pain. EXAM: ABDOMEN - 1 VIEW COMPARISON:  Abdominopelvic CT 06/11/2023.  Radiographs 07/18/2020. FINDINGS: 1121 hours. Two portable supine views of the abdomen are submitted. There is a normal nonobstructive bowel gas pattern. No supine evidence of pneumoperitoneum or bowel wall thickening. Splenic granulomas, cholecystectomy clips and scattered vascular calcifications are noted. There are degenerative changes in the spine. IMPRESSION:  No evidence of bowel obstruction or other acute process. Electronically Signed   By: Carey Bullocks M.D.   On: 06/23/2023 15:21   CT ABDOMEN PELVIS WO CONTRAST Result Date: 06/11/2023 CLINICAL DATA:  Abdominal pain EXAM: CT ABDOMEN AND PELVIS WITHOUT CONTRAST TECHNIQUE: Multidetector CT imaging of the abdomen and pelvis was performed following the standard protocol without IV contrast. RADIATION DOSE REDUCTION: This exam was performed according to the departmental dose-optimization program which includes automated exposure control, adjustment of the mA and/or kV according to patient size and/or use of iterative reconstruction technique. COMPARISON:  02/24/2023 FINDINGS: Lower chest: No acute abnormality Hepatobiliary: No focal liver abnormality is seen. Status post cholecystectomy. No biliary dilatation. Few scattered calcifications. Pancreas: No focal abnormality or ductal dilatation. Spleen: Scattered calcifications.  Normal size. Adrenals/Urinary Tract: No adrenal abnormality. No focal renal abnormality. No stones or hydronephrosis. Urinary bladder is unremarkable. Stomach/Bowel: Left colonic diverticulosis. No active diverticulitis. Normal appendix. Stomach and small bowel decompressed. No bowel obstruction or inflammatory process. Vascular/Lymphatic: Aortoiliac atherosclerosis. No evidence of aneurysm or adenopathy. Reproductive: Uterus and adnexa unremarkable.  No mass. Other: No free fluid or free air.  No acute Musculoskeletal: Bony abnormality. IMPRESSION: No acute findings in the abdomen or pelvis. Old granulomatous disease in the liver and spleen. Aortic atherosclerosis. Left colonic diverticulosis. Electronically Signed   By: Charlett Nose M.D.   On: 06/11/2023 21:04      Subjective: Patient seen and examined at the bedside today.  He appeared comfortable.  Lying in bed.  Not in any kind of distress.  Complains of some headache.  Any dysuria.  Afebrile, hemodynamically stable.  Looks depressed  and apathetic.  Discharge Exam: Vitals:   06/24/23 0855 06/24/23 1158  BP: 131/61 106/69  Pulse: 62 74  Resp:  17  Temp:  98.5 F (36.9 C)  SpO2: 99% 98%   Vitals:   06/23/23 1927 06/24/23 0502 06/24/23 0855 06/24/23 1158  BP: 109/61 130/63 131/61 106/69  Pulse: 65 61 62 74  Resp: 16 16  17   Temp: (!) 97.5 F (36.4 C) (!) 97.4 F (36.3 C)  98.5 F (36.9 C)  TempSrc: Oral Oral    SpO2: 96% 97% 99% 98%  Weight:      Height:        General: Pt is alert, awake, not in acute distress, lying on bed Cardiovascular: RRR, S1/S2 +, no rubs, no gallops Respiratory: CTA bilaterally,  no wheezing, no rhonchi Abdominal: Soft, NT, ND, bowel sounds + Extremities: no edema, no cyanosis    The results of significant diagnostics from this hospitalization (including imaging, microbiology, ancillary and laboratory) are listed below for reference.     Microbiology: No results found for this or any previous visit (from the past 240 hours).    Labs: BNP (last 3 results) Recent Labs    08/23/22 1633  BNP 104*   Basic Metabolic Panel: Recent Labs  Lab 06/22/23 0742 06/24/23 0511  NA 129* 132*  K 5.5* 4.3  CL 100 101  CO2 19* 21*  GLUCOSE 218* 147*  BUN 31* 37*  CREATININE 1.37* 1.65*  CALCIUM 9.1 9.1   Liver Function Tests: No results for input(s): "AST", "ALT", "ALKPHOS", "BILITOT", "PROT", "ALBUMIN" in the last 168 hours.  No results for input(s): "LIPASE", "AMYLASE" in the last 168 hours. No results for input(s): "AMMONIA" in the last 168 hours. CBC: Recent Labs  Lab 06/22/23 0742 06/24/23 0511  WBC 6.2 7.6  NEUTROABS 3.2 3.8  HGB 11.7* 11.6*  HCT 37.6 35.7*  MCV 92.8 89.7  PLT 274 254   Cardiac Enzymes: No results for input(s): "CKTOTAL", "CKMB", "CKMBINDEX", "TROPONINI" in the last 168 hours. BNP: Invalid input(s): "POCBNP" CBG: Recent Labs  Lab 06/23/23 1148 06/23/23 1643 06/23/23 2048 06/24/23 0824 06/24/23 1159  GLUCAP 327* 73 172* 187* 173*    D-Dimer No results for input(s): "DDIMER" in the last 72 hours. Hgb A1c No results for input(s): "HGBA1C" in the last 72 hours. Lipid Profile No results for input(s): "CHOL", "HDL", "LDLCALC", "TRIG", "CHOLHDL", "LDLDIRECT" in the last 72 hours. Thyroid function studies No results for input(s): "TSH", "T4TOTAL", "T3FREE", "THYROIDAB" in the last 72 hours.  Invalid input(s): "FREET3" Anemia work up No results for input(s): "VITAMINB12", "FOLATE", "FERRITIN", "TIBC", "IRON", "RETICCTPCT" in the last 72 hours. Urinalysis    Component Value Date/Time   COLORURINE STRAW (A) 06/12/2023 1410   APPEARANCEUR HAZY (A) 06/12/2023 1410   LABSPEC 1.011 06/12/2023 1410   PHURINE 5.0 06/12/2023 1410   GLUCOSEU NEGATIVE 06/12/2023 1410   HGBUR NEGATIVE 06/12/2023 1410   BILIRUBINUR NEGATIVE 06/12/2023 1410   BILIRUBINUR negative 05/14/2023 1625   KETONESUR NEGATIVE 06/12/2023 1410   PROTEINUR NEGATIVE 06/12/2023 1410   UROBILINOGEN 0.2 05/14/2023 1625   UROBILINOGEN 0.2 09/16/2014 0009   NITRITE NEGATIVE 06/12/2023 1410   LEUKOCYTESUR MODERATE (A) 06/12/2023 1410   Sepsis Labs Recent Labs  Lab 06/22/23 0742 06/24/23 0511  WBC 6.2 7.6   Microbiology No results found for this or any previous visit (from the past 240 hours).   Please note: You were cared for by a hospitalist during your hospital stay. Once you are discharged, your primary care physician will handle any further medical issues. Please note that NO REFILLS for any discharge medications will be authorized once you are discharged, as it is imperative that you return to your primary care physician (or establish a relationship with a primary care physician if you do not have one) for your post hospital discharge needs so that they can reassess your need for medications and monitor your lab values.    Time coordinating discharge: 40 minutes  SIGNED:   Fran Lowes, MD  [facility] 06/24/2023, 2:12 PM Pager  [phone]  If 7PM-7AM, please contact night-coverage [email or web address] Password [email or web address]

## 2023-06-24 NOTE — Plan of Care (Signed)
  Problem: Education: Goal: Knowledge of General Education information will improve Description: Including pain rating scale, medication(s)/side effects and non-pharmacologic comfort measures Outcome: Progressing   Problem: Clinical Measurements: Goal: Diagnostic test results will improve Outcome: Progressing   Problem: Activity: Goal: Risk for activity intolerance will decrease Outcome: Progressing   Problem: Coping: Goal: Level of anxiety will decrease Outcome: Progressing   Problem: Pain Managment: Goal: General experience of comfort will improve and/or be controlled Outcome: Progressing   Problem: Clinical Measurements: Goal: Ability to maintain clinical measurements within normal limits will improve Outcome: Progressing Goal: Diagnostic test results will improve Outcome: Progressing Goal: Respiratory complications will improve Outcome: Progressing   Problem: Nutrition: Goal: Adequate nutrition will be maintained Outcome: Progressing

## 2023-06-25 ENCOUNTER — Telehealth: Payer: Self-pay

## 2023-06-25 NOTE — Transitions of Care (Post Inpatient/ED Visit) (Unsigned)
   06/25/2023  Name: KARIYA LAVERGNE MRN: [id number] DOB: 1945/06/30  Today's TOC FU Call Status: Today's TOC FU Call Status:: Unsuccessful Call (1st Attempt) Unsuccessful Call (1st Attempt) Date: 06/25/23  Attempted to reach the patient regarding the most recent Inpatient/ED visit.  Follow Up Plan: Additional outreach attempts will be made to reach the patient to complete the Transitions of Care (Post Inpatient/ED visit) call.   Signature Darrall Ellison, LPN [facility] Nurse Health Advisor Direct Dial [phone]

## 2023-07-02 ENCOUNTER — Telehealth: Payer: Self-pay | Admitting: *Deleted

## 2023-07-02 ENCOUNTER — Other Ambulatory Visit: Payer: Self-pay | Admitting: Cardiology

## 2023-07-02 DIAGNOSIS — I4892 Unspecified atrial flutter: Secondary | ICD-10-CM

## 2023-07-02 NOTE — Progress Notes (Signed)
 Complex Care Management Care Guide Note  07/02/2023 Name: RHEMA BOYETT MRN: [id number] DOB: [birth year]  MCKAYLA MULCAHEY is a 78 y.o. year old female who is a primary care patient of Claire Crick, MD and is actively engaged with the care management team. I reached out to Kyle Pho by phone today to assist with re-scheduling  with the Licensed Clinical Child psychotherapist.  Follow up plan: Unsuccessful telephone outreach attempt made. A HIPAA compliant phone message was left for the patient providing contact information and requesting a return call.  Kandis Ormond, CMA [place]  [facility], [facility] Guide Direct Dial: [phone]  Fax: [phone] Website: [email or web address]

## 2023-07-07 NOTE — Progress Notes (Signed)
 Complex Care Management Care Guide Note  07/07/2023 Name: Maria Fox MRN: [id number] DOB: [birth year]  Maria Fox is a 78 y.o. year old female who is a primary care patient of Claire Crick, MD and is actively engaged with the care management team. I reached out to Kyle Pho by phone today to assist with re-scheduling  with the Licensed Clinical Child psychotherapist.  Follow up plan: Unsuccessful telephone outreach attempt made. A HIPAA compliant phone message was left for the patient providing contact information and requesting a return call. No further outreach attempts will be made due to inability to maintain patient contact.   Kandis Ormond, CMA [place]  [facility], [facility] Guide Direct Dial: [phone]  Fax: [phone] Website: [email or web address]

## 2023-08-20 ENCOUNTER — Telehealth: Payer: Self-pay | Admitting: Neurology

## 2023-08-20 NOTE — Telephone Encounter (Signed)
 MYC conf

## 2023-08-25 ENCOUNTER — Ambulatory Visit: Admitting: Neurology

## 2023-08-25 ENCOUNTER — Encounter: Payer: Self-pay | Admitting: Neurology

## 2023-08-25 ENCOUNTER — Ambulatory Visit: Payer: 59 | Admitting: Neurology

## 2023-09-01 ENCOUNTER — Encounter (HOSPITAL_COMMUNITY): Payer: Self-pay | Admitting: *Deleted

## 2023-09-01 ENCOUNTER — Emergency Department (HOSPITAL_COMMUNITY)
Admission: EM | Admit: 2023-09-01 | Discharge: 2023-09-02 | Disposition: A | Discharge status: Home / Self Care | Attending: Emergency Medicine | Admitting: Emergency Medicine

## 2023-09-01 ENCOUNTER — Emergency Department (HOSPITAL_COMMUNITY)

## 2023-09-01 ENCOUNTER — Other Ambulatory Visit: Payer: Self-pay | Admitting: Family Medicine

## 2023-09-01 ENCOUNTER — Other Ambulatory Visit: Payer: Self-pay

## 2023-09-01 DIAGNOSIS — F332 Major depressive disorder, recurrent severe without psychotic features: Secondary | ICD-10-CM | POA: Diagnosis not present

## 2023-09-01 DIAGNOSIS — T675XXA Heat exhaustion, unspecified, initial encounter: Secondary | ICD-10-CM | POA: Diagnosis not present

## 2023-09-01 DIAGNOSIS — R296 Repeated falls: Secondary | ICD-10-CM | POA: Diagnosis not present

## 2023-09-01 DIAGNOSIS — Z5902 Unsheltered homelessness: Secondary | ICD-10-CM | POA: Diagnosis not present

## 2023-09-01 DIAGNOSIS — R0602 Shortness of breath: Secondary | ICD-10-CM | POA: Diagnosis not present

## 2023-09-01 DIAGNOSIS — Z7901 Long term (current) use of anticoagulants: Secondary | ICD-10-CM | POA: Diagnosis not present

## 2023-09-01 DIAGNOSIS — I129 Hypertensive chronic kidney disease with stage 1 through stage 4 chronic kidney disease, or unspecified chronic kidney disease: Secondary | ICD-10-CM | POA: Diagnosis not present

## 2023-09-01 DIAGNOSIS — N3000 Acute cystitis without hematuria: Secondary | ICD-10-CM | POA: Diagnosis not present

## 2023-09-01 DIAGNOSIS — E1165 Type 2 diabetes mellitus with hyperglycemia: Secondary | ICD-10-CM | POA: Diagnosis not present

## 2023-09-01 DIAGNOSIS — R000 Tachycardia, unspecified: Secondary | ICD-10-CM | POA: Diagnosis not present

## 2023-09-01 DIAGNOSIS — Z5900 Homelessness unspecified: Secondary | ICD-10-CM | POA: Insufficient documentation

## 2023-09-01 DIAGNOSIS — E1122 Type 2 diabetes mellitus with diabetic chronic kidney disease: Secondary | ICD-10-CM | POA: Diagnosis not present

## 2023-09-01 DIAGNOSIS — I110 Hypertensive heart disease with heart failure: Secondary | ICD-10-CM | POA: Diagnosis not present

## 2023-09-01 DIAGNOSIS — E1149 Type 2 diabetes mellitus with other diabetic neurological complication: Secondary | ICD-10-CM

## 2023-09-01 DIAGNOSIS — Z7984 Long term (current) use of oral hypoglycemic drugs: Secondary | ICD-10-CM | POA: Diagnosis not present

## 2023-09-01 DIAGNOSIS — Z955 Presence of coronary angioplasty implant and graft: Secondary | ICD-10-CM | POA: Diagnosis not present

## 2023-09-01 DIAGNOSIS — E860 Dehydration: Secondary | ICD-10-CM | POA: Diagnosis not present

## 2023-09-01 DIAGNOSIS — K219 Gastro-esophageal reflux disease without esophagitis: Secondary | ICD-10-CM

## 2023-09-01 DIAGNOSIS — I5032 Chronic diastolic (congestive) heart failure: Secondary | ICD-10-CM | POA: Diagnosis not present

## 2023-09-01 DIAGNOSIS — R410 Disorientation, unspecified: Secondary | ICD-10-CM | POA: Diagnosis not present

## 2023-09-01 DIAGNOSIS — I2510 Atherosclerotic heart disease of native coronary artery without angina pectoris: Secondary | ICD-10-CM | POA: Diagnosis not present

## 2023-09-01 DIAGNOSIS — N390 Urinary tract infection, site not specified: Secondary | ICD-10-CM | POA: Diagnosis not present

## 2023-09-01 DIAGNOSIS — Z7902 Long term (current) use of antithrombotics/antiplatelets: Secondary | ICD-10-CM | POA: Diagnosis not present

## 2023-09-01 DIAGNOSIS — N1832 Chronic kidney disease, stage 3b: Secondary | ICD-10-CM | POA: Diagnosis not present

## 2023-09-01 DIAGNOSIS — I700 Atherosclerosis of aorta: Secondary | ICD-10-CM | POA: Diagnosis not present

## 2023-09-01 DIAGNOSIS — R739 Hyperglycemia, unspecified: Secondary | ICD-10-CM | POA: Diagnosis present

## 2023-09-01 DIAGNOSIS — Z8616 Personal history of COVID-19: Secondary | ICD-10-CM | POA: Diagnosis not present

## 2023-09-01 DIAGNOSIS — N183 Chronic kidney disease, stage 3 unspecified: Secondary | ICD-10-CM | POA: Diagnosis not present

## 2023-09-01 DIAGNOSIS — R531 Weakness: Secondary | ICD-10-CM | POA: Diagnosis not present

## 2023-09-01 DIAGNOSIS — R45851 Suicidal ideations: Secondary | ICD-10-CM | POA: Diagnosis present

## 2023-09-01 LAB — URINALYSIS, ROUTINE W REFLEX MICROSCOPIC
Bilirubin Urine: NEGATIVE
Glucose, UA: 150 mg/dL — AB
Hgb urine dipstick: NEGATIVE
Ketones, ur: NEGATIVE mg/dL
Nitrite: NEGATIVE
Protein, ur: NEGATIVE mg/dL
Specific Gravity, Urine: 1.005 (ref 1.005–1.030)
WBC, UA: >50 WBC/hpf (ref 0–5)
pH: 5 (ref 5.0–8.0)

## 2023-09-01 LAB — CBC WITH DIFFERENTIAL/PLATELET
Abs Immature Granulocytes: 0.03 10*3/uL (ref 0.00–0.07)
Basophils Absolute: 0.1 10*3/uL (ref 0.0–0.1)
Basophils Relative: 1 %
Eosinophils Absolute: 0.2 10*3/uL (ref 0.0–0.5)
Eosinophils Relative: 2 %
HCT: 38.9 % (ref 36.0–46.0)
Hemoglobin: 12.6 g/dL (ref 12.0–15.0)
Immature Granulocytes: 0 %
Lymphocytes Relative: 27 %
Lymphs Abs: 2.1 10*3/uL (ref 0.7–4.0)
MCH: 29 pg (ref 26.0–34.0)
MCHC: 32.4 g/dL (ref 30.0–36.0)
MCV: 89.6 fL (ref 80.0–100.0)
Monocytes Absolute: 0.6 10*3/uL (ref 0.1–1.0)
Monocytes Relative: 8 %
Neutro Abs: 4.9 10*3/uL (ref 1.7–7.7)
Neutrophils Relative %: 62 %
Platelets: 268 10*3/uL (ref 150–400)
RBC: 4.34 MIL/uL (ref 3.87–5.11)
RDW: 14.1 % (ref 11.5–15.5)
WBC: 7.9 10*3/uL (ref 4.0–10.5)
nRBC: 0 % (ref 0.0–0.2)

## 2023-09-01 LAB — COMPREHENSIVE METABOLIC PANEL WITH GFR
ALT: 12 U/L (ref 0–44)
AST: 20 U/L (ref 15–41)
Albumin: 4.1 g/dL (ref 3.5–5.0)
Alkaline Phosphatase: 82 U/L (ref 38–126)
Anion gap: 17 — ABNORMAL HIGH (ref 5–15)
BUN: 20 mg/dL (ref 8–23)
CO2: 16 mmol/L — ABNORMAL LOW (ref 22–32)
Calcium: 9.3 mg/dL (ref 8.9–10.3)
Chloride: 95 mmol/L — ABNORMAL LOW (ref 98–111)
Creatinine, Ser: 1.78 mg/dL — ABNORMAL HIGH (ref 0.44–1.00)
GFR, Estimated: 29 mL/min — ABNORMAL LOW (ref >60)
Glucose, Bld: 351 mg/dL — ABNORMAL HIGH (ref 70–99)
Potassium: 4 mmol/L (ref 3.5–5.1)
Sodium: 128 mmol/L — ABNORMAL LOW (ref 135–145)
Total Bilirubin: 0.9 mg/dL (ref 0.0–1.2)
Total Protein: 7.3 g/dL (ref 6.5–8.1)

## 2023-09-01 LAB — CBG MONITORING, ED: Glucose-Capillary: 385 mg/dL — ABNORMAL HIGH (ref 70–99)

## 2023-09-01 LAB — CK: Total CK: 63 U/L (ref 38–234)

## 2023-09-01 LAB — TROPONIN I (HIGH SENSITIVITY)
Troponin I (High Sensitivity): 16 ng/L (ref <18)
Troponin I (High Sensitivity): 17 ng/L (ref <18)

## 2023-09-01 MED ORDER — SODIUM CHLORIDE 0.9 % IV BOLUS
1000.0000 mL | Freq: Once | INTRAVENOUS | Status: AC
  Administered 2023-09-01: 1000 mL via INTRAVENOUS

## 2023-09-01 NOTE — ED Provider Triage Note (Signed)
 Emergency Medicine Provider Triage Evaluation Note  Maria Fox , a 78 y.o. female  was evaluated in triage.  Pt complains of not feeling well.  Think she has a urinary tract infection.  She notes dysuria and suprapubic abdominal pain.  States she also has some pain under her left breast.  She has a prior history of CABG according to patient.  No shortness of breath.  Does not radiate to her arms or her back.  No pain or swelling to lower legs.  She also think she has heat exhaustion.  She states out of her house by her significant other and is been staying in her car down the street however car does not work therefore cannot turn on for the [facility].  She has had some nausea and diarrhea without vomiting  Review of Systems  Positive: Lower abdominal pain, chest pain Negative:   Physical Exam  BP (!) 144/81 (BP Location: Right Arm)   Pulse (!) 103   Temp 98.3 F (36.8 C) (Oral)   Resp 17   Ht 5' 3 (1.6 m)   Wt 59.9 kg   SpO2 98% Comment: Simultaneous filing. User may not have seen previous data.  BMI 23.38 kg/m  Gen:   Awake, no distress   Resp:  Normal effort  MSK:   Moves extremities without difficulty  Other:    Medical Decision Making  Medically screening exam initiated at 4:54 PM.  Appropriate orders placed.  Maria Fox was informed that the remainder of the evaluation will be completed by another provider, this initial triage assessment does not replace that evaluation, and the importance of remaining in the ED until their evaluation is complete.  Dysuria, chest pain, concern for heat injury   Torria Fromer A, PA-C 09/01/23 1656

## 2023-09-01 NOTE — ED Provider Notes (Signed)
 [place] EMERGENCY DEPARTMENT AT [facility] Provider Note   CSN: [id number] Arrival date & time: 09/01/23  1614     Patient presents with: Heat Exposure   Maria Fox is a 78 y.o. female.  {Add pertinent medical, surgical, social history, OB history to HPI:32947} Patient is an 78 year old female presenting for multiple complaints including generalized feelings of unwellness, dehydration, decreased urination and concerns for urinary tract infection.  Patient denies any abdominal pain, back pain, dysuria.  But she does admit to having to push in order to urinate and decreased urine production.  She states currently she is living out of her car and does not have air conditioning.  The temperature today was 39 F with a real feel of 100 F.  The history is provided by the patient. No language interpreter was used.       Prior to Admission medications   Medication Sig Start Date End Date Taking? Authorizing Provider  acetaminophen  (TYLENOL ) 650 MG CR tablet Take 1,300 mg by mouth every 6 (six) hours as needed for pain.    [provider]  albuterol  (PROVENTIL ) (2.5 MG/3ML) 0.083% nebulizer solution Inhale 3 mLs into the lungs every 6 (six) hours as needed for wheezing or shortness of breath. 03/26/23   Jadapalle, Sree, MD  albuterol  (VENTOLIN  HFA) 108 (90 Base) MCG/ACT inhaler Inhale 1-2 puffs into the lungs every 6 (six) hours as needed for wheezing or shortness of breath. 05/15/23   Rilla Baller, MD  apixaban  (ELIQUIS ) 5 MG TABS tablet Take 1 tablet (5 mg total) by mouth 2 (two) times daily. 05/15/23   Rilla Baller, MD  ARIPiprazole  (ABILIFY ) 2 MG tablet Take 1 tablet (2 mg total) by mouth daily. 06/21/23   Jillian Buttery, MD  atorvastatin  (LIPITOR) 40 MG tablet TAKE 1 TABLET BY MOUTH EVERY EVENING 06/04/23   Rilla Baller, MD  clopidogrel  (PLAVIX ) 75 MG tablet Take 1 tablet (75 mg total) by mouth daily. 05/15/23   Rilla Baller, MD  dorzolamide -timolol   (COSOPT ) 2-0.5 % ophthalmic solution Place 1 drop into both eyes 2 (two) times daily. 04/24/23   [provider]  DULoxetine  (CYMBALTA ) 30 MG capsule Take 1 capsule (30 mg total) by mouth daily. 06/21/23   Jillian Buttery, MD  DULoxetine  (CYMBALTA ) 60 MG capsule Take 1 capsule (60 mg total) by mouth at bedtime. 06/20/23   Jillian Buttery, MD  feeding supplement, GLUCERNA SHAKE, (GLUCERNA SHAKE) LIQD Take 237 mLs by mouth 3 (three) times daily between meals. Patient taking differently: Take 237 mLs by mouth in the morning and at bedtime. 03/26/23   Jadapalle, Sree, MD  glipiZIDE  (GLUCOTROL ) 10 MG tablet Take 1 tablet (10 mg total) by mouth 2 (two) times daily. Always take with food 05/15/23   Rilla Baller, MD  hydrocortisone  cream 1 % Apply topically 3 (three) times daily. Patient taking differently: Apply 1 Application topically 2 (two) times daily as needed for itching. 03/26/23   Jadapalle, Sree, MD  isosorbide  mononitrate (IMDUR ) 30 MG 24 hr tablet Take 0.5 tablets (15 mg total) by mouth daily. 05/15/23   Rilla Baller, MD  latanoprost  (XALATAN ) 0.005 % ophthalmic solution Place 1 drop into both eyes at bedtime. 03/26/23   Jadapalle, Sree, MD  melatonin 5 MG TABS Take 5 mg by mouth at bedtime as needed (sleep).    [provider]  metFORMIN  (GLUCOPHAGE -XR) 500 MG 24 hr tablet Take 1 tablet (500 mg total) by mouth daily with breakfast. 05/15/23   Rilla Baller, MD  metoprolol  succinate (TOPROL -XL) 25 MG 24 hr tablet Take 1 tablet (25 mg total) by mouth daily. 05/15/23   Rilla Baller, MD  nitroGLYCERIN  (NITROSTAT ) 0.4 MG SL tablet Place 0.4 mg under the tongue every 5 (five) minutes as needed for chest pain. 04/04/23   [provider]  pantoprazole  (PROTONIX ) 40 MG tablet Take 1 tablet (40 mg total) by mouth daily. Patient taking differently: Take 40 mg by mouth 2 (two) times daily. 05/15/23   Rilla Baller, MD  QUEtiapine  (SEROQUEL ) 50 MG tablet Take 50 mg by mouth  at bedtime.    [provider]    Allergies: Bee venom, Mushroom extract complex (obsolete), Penicillins, Codeine, Sulfa  antibiotics, and Iohexol     Review of Systems  Constitutional:  Negative for chills and fever.  HENT:  Negative for ear pain and sore throat.   Eyes:  Negative for pain and visual disturbance.  Respiratory:  Negative for cough and shortness of breath.   Cardiovascular:  Negative for chest pain and palpitations.  Gastrointestinal:  Negative for abdominal pain and vomiting.  Genitourinary:  Negative for dysuria and hematuria.  Musculoskeletal:  Negative for arthralgias and back pain.  Skin:  Negative for color change and rash.  Neurological:  Negative for seizures and syncope.  All other systems reviewed and are negative.   Updated Vital Signs BP 123/81 (BP Location: Right Arm)   Pulse 100   Temp 97.7 F (36.5 C) (Oral)   Resp (!) 27   Ht 5' 3 (1.6 m)   Wt 59.9 kg   SpO2 100%   BMI 23.38 kg/m   Physical Exam  (all labs ordered are listed, but only abnormal results are displayed) Labs Reviewed  COMPREHENSIVE METABOLIC PANEL WITH GFR - Abnormal; Notable for the following components:      Result Value   Sodium 128 (*)    Chloride 95 (*)    CO2 16 (*)    Glucose, Bld 351 (*)    Creatinine, Ser 1.78 (*)    GFR, Estimated 29 (*)    Anion gap 17 (*)    All other components within normal limits  CBG MONITORING, ED - Abnormal; Notable for the following components:   Glucose-Capillary 385 (*)    All other components within normal limits  CBC WITH DIFFERENTIAL/PLATELET  CK  URINALYSIS, ROUTINE W REFLEX MICROSCOPIC  TROPONIN I (HIGH SENSITIVITY)  TROPONIN I (HIGH SENSITIVITY)    EKG: None  Radiology: DG Chest 2 View Result Date: 09/01/2023 CLINICAL DATA:  Shortness of breath, heat exposure, weakness EXAM: CHEST - 2 VIEW COMPARISON:  02/24/2023 FINDINGS: Sternotomy and CABG. Loop recorder. Stable cardiomediastinal silhouette. Aortic  atherosclerotic calcification. Coronary stenting. Similar scarring or atelectasis in the left lower lung. Otherwise no focal consolidation. No pleural effusion or pneumothorax. IMPRESSION: No active cardiopulmonary disease. Electronically Signed   By: Norman Gatlin M.D.   On: 09/01/2023 17:22    {Document cardiac monitor, telemetry assessment procedure when appropriate:32947} Procedures   Medications Ordered in the ED  sodium chloride  0.9 % bolus 1,000 mL (has no administration in time range)      {Click here for ABCD2, HEART and other calculators REFRESH Note before signing:1}                              Medical Decision Making Amount and/or Complexity of Data Reviewed Labs: ordered.    78 year old female presenting for multiple complaints including generalized feelings of unwellness,  dehydration, decreased urination and concerns for urinary tract infection.  On arrival patient is alert and oriented x 3, no acute distress, afebrile, stable vital signs.  No hyperthermia.  Stable blood pressure.  IV fluids initiated for likely dehydration.  Laboratory studies are stable without leukocytosis or sepsis.  UA is pending.  {Document critical care time when appropriate  Document review of labs and clinical decision tools ie CHADS2VASC2, etc  Document your independent review of radiology images and any outside records  Document your discussion with family members, caretakers and with consultants  Document social determinants of health affecting pt's care  Document your decision making why or why not admission, treatments were needed:32947:::1}   Final diagnoses:  None    ED Discharge Orders     None

## 2023-09-01 NOTE — ED Triage Notes (Addendum)
 BIB [facility] from her car. Car does not work. She was kicked out of house by s.o. and has been staying in her car down the street. Here for heat exposure. EMS reports no complaints. No HA or nausea. Skin is clammy. HR 118. No IV, BS, or temp. States, haven't eaten in [place]. Ice pack applied to bilateral axilla. Alert, NAD, calm, interactive. A&O.  Pt reports HA, nausea, feel weak, and pain under L breast.

## 2023-09-01 NOTE — ED Notes (Signed)
 Lab at [facility] in triage 2

## 2023-09-02 ENCOUNTER — Encounter (HOSPITAL_COMMUNITY): Payer: Self-pay

## 2023-09-02 ENCOUNTER — Emergency Department (HOSPITAL_COMMUNITY)
Admission: EM | Admit: 2023-09-02 | Discharge: 2023-09-02 | Disposition: A | Discharge status: Other Acute Inpatient Hospital | Source: Home / Self Care | Attending: Emergency Medicine | Admitting: Emergency Medicine

## 2023-09-02 ENCOUNTER — Ambulatory Visit (HOSPITAL_COMMUNITY)
Admission: EM | Admit: 2023-09-02 | Discharge: 2023-09-06 | Disposition: A | Discharge status: Other Acute Inpatient Hospital | Attending: Psychiatry | Admitting: Psychiatry

## 2023-09-02 ENCOUNTER — Ambulatory Visit (INDEPENDENT_AMBULATORY_CARE_PROVIDER_SITE_OTHER)
Admission: EM | Admit: 2023-09-02 | Discharge: 2023-09-02 | Disposition: A | Discharge status: Other Acute Inpatient Hospital | Source: Home / Self Care

## 2023-09-02 ENCOUNTER — Other Ambulatory Visit: Payer: Self-pay

## 2023-09-02 ENCOUNTER — Other Ambulatory Visit: Payer: Self-pay | Admitting: Family Medicine

## 2023-09-02 ENCOUNTER — Other Ambulatory Visit (HOSPITAL_COMMUNITY): Payer: Self-pay

## 2023-09-02 DIAGNOSIS — R32 Unspecified urinary incontinence: Secondary | ICD-10-CM | POA: Diagnosis not present

## 2023-09-02 DIAGNOSIS — Z7901 Long term (current) use of anticoagulants: Secondary | ICD-10-CM | POA: Insufficient documentation

## 2023-09-02 DIAGNOSIS — Z7984 Long term (current) use of oral hypoglycemic drugs: Secondary | ICD-10-CM | POA: Insufficient documentation

## 2023-09-02 DIAGNOSIS — I129 Hypertensive chronic kidney disease with stage 1 through stage 4 chronic kidney disease, or unspecified chronic kidney disease: Secondary | ICD-10-CM | POA: Diagnosis not present

## 2023-09-02 DIAGNOSIS — I2510 Atherosclerotic heart disease of native coronary artery without angina pectoris: Secondary | ICD-10-CM | POA: Insufficient documentation

## 2023-09-02 DIAGNOSIS — Z951 Presence of aortocoronary bypass graft: Secondary | ICD-10-CM | POA: Diagnosis not present

## 2023-09-02 DIAGNOSIS — N183 Chronic kidney disease, stage 3 unspecified: Secondary | ICD-10-CM | POA: Insufficient documentation

## 2023-09-02 DIAGNOSIS — R296 Repeated falls: Secondary | ICD-10-CM | POA: Insufficient documentation

## 2023-09-02 DIAGNOSIS — N390 Urinary tract infection, site not specified: Secondary | ICD-10-CM | POA: Insufficient documentation

## 2023-09-02 DIAGNOSIS — Z8616 Personal history of COVID-19: Secondary | ICD-10-CM | POA: Insufficient documentation

## 2023-09-02 DIAGNOSIS — L80 Vitiligo: Secondary | ICD-10-CM | POA: Insufficient documentation

## 2023-09-02 DIAGNOSIS — I4891 Unspecified atrial fibrillation: Secondary | ICD-10-CM | POA: Insufficient documentation

## 2023-09-02 DIAGNOSIS — N3000 Acute cystitis without hematuria: Secondary | ICD-10-CM | POA: Insufficient documentation

## 2023-09-02 DIAGNOSIS — R45851 Suicidal ideations: Secondary | ICD-10-CM | POA: Diagnosis not present

## 2023-09-02 DIAGNOSIS — Z794 Long term (current) use of insulin: Secondary | ICD-10-CM | POA: Insufficient documentation

## 2023-09-02 DIAGNOSIS — F329 Major depressive disorder, single episode, unspecified: Secondary | ICD-10-CM | POA: Insufficient documentation

## 2023-09-02 DIAGNOSIS — Z7902 Long term (current) use of antithrombotics/antiplatelets: Secondary | ICD-10-CM | POA: Insufficient documentation

## 2023-09-02 DIAGNOSIS — I5032 Chronic diastolic (congestive) heart failure: Secondary | ICD-10-CM | POA: Insufficient documentation

## 2023-09-02 DIAGNOSIS — E1122 Type 2 diabetes mellitus with diabetic chronic kidney disease: Secondary | ICD-10-CM | POA: Insufficient documentation

## 2023-09-02 DIAGNOSIS — F339 Major depressive disorder, recurrent, unspecified: Secondary | ICD-10-CM | POA: Diagnosis not present

## 2023-09-02 DIAGNOSIS — E1165 Type 2 diabetes mellitus with hyperglycemia: Secondary | ICD-10-CM | POA: Insufficient documentation

## 2023-09-02 DIAGNOSIS — Z5902 Unsheltered homelessness: Secondary | ICD-10-CM | POA: Insufficient documentation

## 2023-09-02 DIAGNOSIS — I110 Hypertensive heart disease with heart failure: Secondary | ICD-10-CM | POA: Insufficient documentation

## 2023-09-02 DIAGNOSIS — R45850 Homicidal ideations: Secondary | ICD-10-CM | POA: Insufficient documentation

## 2023-09-02 DIAGNOSIS — L309 Dermatitis, unspecified: Secondary | ICD-10-CM

## 2023-09-02 DIAGNOSIS — R000 Tachycardia, unspecified: Secondary | ICD-10-CM | POA: Diagnosis not present

## 2023-09-02 DIAGNOSIS — E1169 Type 2 diabetes mellitus with other specified complication: Secondary | ICD-10-CM

## 2023-09-02 DIAGNOSIS — F332 Major depressive disorder, recurrent severe without psychotic features: Secondary | ICD-10-CM | POA: Diagnosis not present

## 2023-09-02 DIAGNOSIS — Z5900 Homelessness unspecified: Secondary | ICD-10-CM | POA: Insufficient documentation

## 2023-09-02 DIAGNOSIS — R4589 Other symptoms and signs involving emotional state: Secondary | ICD-10-CM

## 2023-09-02 DIAGNOSIS — Z91148 Patient's other noncompliance with medication regimen for other reason: Secondary | ICD-10-CM | POA: Diagnosis not present

## 2023-09-02 DIAGNOSIS — F419 Anxiety disorder, unspecified: Secondary | ICD-10-CM | POA: Diagnosis not present

## 2023-09-02 DIAGNOSIS — N1832 Chronic kidney disease, stage 3b: Secondary | ICD-10-CM | POA: Insufficient documentation

## 2023-09-02 DIAGNOSIS — R739 Hyperglycemia, unspecified: Secondary | ICD-10-CM

## 2023-09-02 DIAGNOSIS — R410 Disorientation, unspecified: Secondary | ICD-10-CM | POA: Diagnosis not present

## 2023-09-02 LAB — URINALYSIS, ROUTINE W REFLEX MICROSCOPIC
Bilirubin Urine: NEGATIVE
Glucose, UA: >=500 mg/dL — AB
Hgb urine dipstick: NEGATIVE
Ketones, ur: NEGATIVE mg/dL
Nitrite: NEGATIVE
Protein, ur: NEGATIVE mg/dL
Specific Gravity, Urine: 1.021 (ref 1.005–1.030)
WBC, UA: >50 WBC/hpf (ref 0–5)
pH: 5 (ref 5.0–8.0)

## 2023-09-02 LAB — CBC WITH DIFFERENTIAL/PLATELET
Abs Immature Granulocytes: 0.04 10*3/uL (ref 0.00–0.07)
Basophils Absolute: 0.1 10*3/uL (ref 0.0–0.1)
Basophils Relative: 1 %
Eosinophils Absolute: 0.2 10*3/uL (ref 0.0–0.5)
Eosinophils Relative: 2 %
HCT: 36.6 % (ref 36.0–46.0)
Hemoglobin: 12 g/dL (ref 12.0–15.0)
Immature Granulocytes: 0 %
Lymphocytes Relative: 22 %
Lymphs Abs: 2.1 10*3/uL (ref 0.7–4.0)
MCH: 29.1 pg (ref 26.0–34.0)
MCHC: 32.8 g/dL (ref 30.0–36.0)
MCV: 88.6 fL (ref 80.0–100.0)
Monocytes Absolute: 0.5 10*3/uL (ref 0.1–1.0)
Monocytes Relative: 6 %
Neutro Abs: 6.7 10*3/uL (ref 1.7–7.7)
Neutrophils Relative %: 69 %
Platelets: 269 10*3/uL (ref 150–400)
RBC: 4.13 MIL/uL (ref 3.87–5.11)
RDW: 14.2 % (ref 11.5–15.5)
WBC: 9.6 10*3/uL (ref 4.0–10.5)
nRBC: 0 % (ref 0.0–0.2)

## 2023-09-02 LAB — RAPID URINE DRUG SCREEN, HOSP PERFORMED
Amphetamines: NOT DETECTED
Barbiturates: NOT DETECTED
Benzodiazepines: NOT DETECTED
Cocaine: NOT DETECTED
Opiates: NOT DETECTED
Tetrahydrocannabinol: NOT DETECTED

## 2023-09-02 LAB — COMPREHENSIVE METABOLIC PANEL WITH GFR
ALT: 12 U/L (ref 0–44)
AST: 20 U/L (ref 15–41)
Albumin: 3.7 g/dL (ref 3.5–5.0)
Alkaline Phosphatase: 70 U/L (ref 38–126)
Anion gap: 12 (ref 5–15)
BUN: 20 mg/dL (ref 8–23)
CO2: 17 mmol/L — ABNORMAL LOW (ref 22–32)
Calcium: 9.2 mg/dL (ref 8.9–10.3)
Chloride: 105 mmol/L (ref 98–111)
Creatinine, Ser: 1.75 mg/dL — ABNORMAL HIGH (ref 0.44–1.00)
GFR, Estimated: 29 mL/min — ABNORMAL LOW (ref >60)
Glucose, Bld: 352 mg/dL — ABNORMAL HIGH (ref 70–99)
Potassium: 3.9 mmol/L (ref 3.5–5.1)
Sodium: 134 mmol/L — ABNORMAL LOW (ref 135–145)
Total Bilirubin: 0.7 mg/dL (ref 0.0–1.2)
Total Protein: 7.1 g/dL (ref 6.5–8.1)

## 2023-09-02 LAB — ETHANOL: Alcohol, Ethyl (B): <15 mg/dL (ref <15)

## 2023-09-02 LAB — CBG MONITORING, ED
Glucose-Capillary: 343 mg/dL — ABNORMAL HIGH (ref 70–99)
Glucose-Capillary: 240 mg/dL — ABNORMAL HIGH (ref 70–99)
Glucose-Capillary: 207 mg/dL — ABNORMAL HIGH (ref 70–99)

## 2023-09-02 LAB — ACETAMINOPHEN LEVEL: Acetaminophen (Tylenol), Serum: <10 ug/mL — ABNORMAL LOW (ref 10–30)

## 2023-09-02 LAB — SALICYLATE LEVEL: Salicylate Lvl: <7 mg/dL — ABNORMAL LOW (ref 7.0–30.0)

## 2023-09-02 LAB — GLUCOSE, CAPILLARY: Glucose-Capillary: 391 mg/dL — ABNORMAL HIGH (ref 70–99)

## 2023-09-02 MED ORDER — ISOSORBIDE MONONITRATE ER 30 MG PO TB24
15.0000 mg | ORAL_TABLET | Freq: Every day | ORAL | Status: DC
  Administered 2023-09-03 – 2023-09-06 (×4): 15 mg via ORAL
  Filled 2023-09-02 (×4): qty 1

## 2023-09-02 MED ORDER — OLANZAPINE 5 MG PO TBDP: 5.0000 mg | ORAL_TABLET | Freq: Three times a day (TID) | ORAL | Status: DC | PRN

## 2023-09-02 MED ORDER — CEFPODOXIME PROXETIL 100 MG/5ML PO SUSR
100.0000 mg | Freq: Two times a day (BID) | ORAL | Status: DC
  Administered 2023-09-03 – 2023-09-05 (×5): 100 mg via ORAL
  Filled 2023-09-02 (×14): qty 5

## 2023-09-02 MED ORDER — APIXABAN 5 MG PO TABS
5.0000 mg | ORAL_TABLET | Freq: Two times a day (BID) | ORAL | Status: DC
  Administered 2023-09-02 – 2023-09-06 (×8): 5 mg via ORAL
  Filled 2023-09-02 (×8): qty 1

## 2023-09-02 MED ORDER — DULOXETINE HCL 60 MG PO CPEP
60.0000 mg | ORAL_CAPSULE | Freq: Every day | ORAL | Status: DC
  Administered 2023-09-02 – 2023-09-05 (×4): 60 mg via ORAL
  Filled 2023-09-02 (×4): qty 1

## 2023-09-02 MED ORDER — ALUM & MAG HYDROXIDE-SIMETH 200-200-20 MG/5ML PO SUSP: 30.0000 mL | ORAL | Status: DC | PRN

## 2023-09-02 MED ORDER — CEFPODOXIME PROXETIL 100 MG PO TABS: 100.0000 mg | ORAL_TABLET | Freq: Two times a day (BID) | ORAL | 0 refills | Status: DC

## 2023-09-02 MED ORDER — ATORVASTATIN CALCIUM 40 MG PO TABS
40.0000 mg | ORAL_TABLET | Freq: Every evening | ORAL | Status: DC
  Administered 2023-09-03 – 2023-09-05 (×3): 40 mg via ORAL
  Filled 2023-09-02 (×3): qty 1

## 2023-09-02 MED ORDER — OLANZAPINE 10 MG IM SOLR: 5.0000 mg | Freq: Three times a day (TID) | INTRAMUSCULAR | Status: DC | PRN

## 2023-09-02 MED ORDER — LATANOPROST 0.005 % OP SOLN
1.0000 [drp] | Freq: Every day | OPHTHALMIC | Status: DC
  Administered 2023-09-03 – 2023-09-05 (×4): 1 [drp] via OPHTHALMIC
  Filled 2023-09-02 (×2): qty 2.5

## 2023-09-02 MED ORDER — SODIUM CHLORIDE 0.9 % IV SOLN
1.0000 g | Freq: Once | INTRAVENOUS | Status: AC
  Administered 2023-09-02: 1 g via INTRAVENOUS
  Filled 2023-09-02: qty 10

## 2023-09-02 MED ORDER — NITROFURANTOIN MONOHYD MACRO 100 MG PO CAPS
100.0000 mg | ORAL_CAPSULE | Freq: Two times a day (BID) | ORAL | 0 refills | Status: DC
  Filled 2023-09-02: qty 10, 5d supply, fill #0

## 2023-09-02 MED ORDER — LACTATED RINGERS IV BOLUS
1000.0000 mL | Freq: Once | INTRAVENOUS | Status: AC
  Administered 2023-09-02: 1000 mL via INTRAVENOUS

## 2023-09-02 MED ORDER — MAGNESIUM HYDROXIDE 400 MG/5ML PO SUSP: 30.0000 mL | Freq: Every day | ORAL | Status: DC | PRN

## 2023-09-02 MED ORDER — DORZOLAMIDE HCL-TIMOLOL MAL 2-0.5 % OP SOLN
1.0000 [drp] | Freq: Two times a day (BID) | OPHTHALMIC | Status: DC
  Administered 2023-09-03 – 2023-09-06 (×8): 1 [drp] via OPHTHALMIC
  Filled 2023-09-02 (×2): qty 10

## 2023-09-02 MED ORDER — ACETAMINOPHEN 325 MG PO TABS
650.0000 mg | ORAL_TABLET | Freq: Four times a day (QID) | ORAL | Status: DC | PRN
  Administered 2023-09-03 – 2023-09-05 (×5): 650 mg via ORAL
  Filled 2023-09-02 (×5): qty 2

## 2023-09-02 MED ORDER — GLIPIZIDE 5 MG PO TABS
10.0000 mg | ORAL_TABLET | Freq: Two times a day (BID) | ORAL | Status: DC
  Administered 2023-09-02 – 2023-09-06 (×8): 10 mg via ORAL
  Filled 2023-09-02 (×8): qty 2

## 2023-09-02 MED ORDER — ARIPIPRAZOLE 2 MG PO TABS
2.0000 mg | ORAL_TABLET | Freq: Every day | ORAL | Status: DC
  Administered 2023-09-03 – 2023-09-06 (×4): 2 mg via ORAL
  Filled 2023-09-02 (×4): qty 1

## 2023-09-02 MED ORDER — NITROFURANTOIN MONOHYD MACRO 100 MG PO CAPS: 100.0000 mg | ORAL_CAPSULE | Freq: Once | ORAL | Status: DC

## 2023-09-02 MED ORDER — FOSFOMYCIN TROMETHAMINE 3 G PO PACK
3.0000 g | PACK | Freq: Once | ORAL | Status: AC
  Administered 2023-09-02: 3 g via ORAL
  Filled 2023-09-02: qty 3

## 2023-09-02 MED ORDER — GLUCERNA SHAKE PO LIQD
237.0000 mL | Freq: Three times a day (TID) | ORAL | Status: DC
Start: 2023-09-03 — End: 2023-09-06
  Administered 2023-09-03 – 2023-09-06 (×11): 237 mL via ORAL
  Filled 2023-09-02 (×3): qty 237

## 2023-09-02 NOTE — BH Assessment (Addendum)
 Comprehensive Clinical Assessment (CCA) Note  09/02/2023 [facility] [id number]  Disposition: Per Maria Lesches, NP inpatient treatment is recommended.  Disposition SW to pursue appropriate inpatient options.  The patient demonstrates the following risk factors for suicide: Chronic risk factors for suicide include: psychiatric disorder of MDD and demographic factors (female, >80 y/o). Acute risk factors for suicide include: None noted in EHR. Protective factors for this patient include: hope for the future. Considering these factors, the overall suicide risk at this point appears to be moderate. Patient is appropriate for outpatient follow up, once stabilized.   Patient is an 78 year old female with a history of Major Depressive Disorder, recurrent, severe w/o psychotic fx who presents voluntarily to [facility] for assessment.  Patient presents unaccompanied.  Patient shares that she was just discharged from [facility].  She was told that she has a UTI. Patient then states that she doesn't have anywhere to go and states, They told me to come here.  Patient then stated that she asked the staff at [facility] if they would admit her to the psych ward because she wants to hurt herself and somebody else. Patient reports she had SI 2 days ago with the plan to overdose. She also endorses HI two days ago, towards her friend [facility]. It is clear, patient's primary concern/stressor is homelessness.  She has minimal support and expresses frustration towards her partner of 40 years.  Per collateral from patient's brother, patient has been homeless for almost a year, living in her car parked in front of there ex-partner's house.  Given her current living situation, patient has not been able to take medications consistently and she struggles to [facility] for her basic needs.  Patient continues to endorse SI and HI, however mostly focuses on needing a place to stay.    Per provider Maria Lesches, NP's note:   Patient also has a significant history of multiple falls and incontinence which often requires adult diapers.  Patient has been hospitalized multiple times this year and unfortunately has not had any assistance in being appropriately placed at any inpatient facility or assisted living facility.  Per chart review patient has been assigned a Child psychotherapist within the health system however it is unclear as to what the status is of placement in permanent housing.  Provider requested that SW, [facility] contact family for collateral.  Per SW note: Writer spoke to the patient brother [facility] [phone]).  Per the patient brother the patient and her live in boyfriend has been separated for over a year .  The house belongs to the boyfriend and she has been out of the house and living in her Nissan for over 1 year.  Her brother reports that the boyfriend asked her to leave when she began to have significant medical issues.  Per chart review, the patient is linked with Arboriculturist [facility] social worker) [facility] [phone].  Writer left  a HIPAA compliant voice mail message. Writer left  a HIPAA compliant voice mail message for her ex-boyfriend who is lusted as her emergency contact [facility] [phone]).   Chief Complaint:  Chief Complaint  Patient presents with   Suicidal   Homicidal   Visit Diagnosis: Major Depressive Disorder, recurrent, severe w/o psychotic fx    CCA Screening, Triage and Referral (STR)  Patient Reported Information How did you hear about us ? [facility] Discharge  What Is the Reason for Your Visit/Call Today? [facility] presents to [facility] voluntarily unaccompanied. Pt  states that she was at [facility] and was told that she has a UTI. Pt states that she doesn't have anywhere to go and they told her to come here. Pt shares that she asked the staff at [facility] if they would admit her to the psych ward because she wants to hurt herself and somebody else. Pt states that  she had SI thoughts 2 days ago with the plan to overdose. Pt states that she also had HI thoughts 2 days ago against her friend [facility]. Pt states that she still feels SI and HI at this time.  How Long Has This Been Causing You Problems? <Week  What Do You Feel Would Help You the Most Today? Social Support   Have You Recently Had Any Thoughts About Hurting Yourself? Yes  Are You Planning to Commit Suicide/Harm Yourself At This time? Yes   Flowsheet Row ED from 09/02/2023 in [facility] ED from 09/01/2023 in [facility] ED to Hosp-Admission (Discharged) from 06/11/2023 in [facility]  C-SSRS RISK CATEGORY High Risk No Risk No Risk    Have you Recently Had Thoughts About Hurting Someone [place]? Yes  Are You Planning to Harm Someone at This Time? Yes  Explanation: No plan or intent   Have You Used Any Alcohol or Drugs in the Past 24 Hours? No  How Long Ago Did You Use Drugs or Alcohol? N/A What Did You Use and How Much? N/A  Do You Currently Have a Therapist/Psychiatrist? No  Name of Therapist/Psychiatrist:    Have You Been Recently Discharged From Any Office Practice or Programs? No  Explanation of Discharge From Practice/Program: N/A    CCA Screening Triage Referral Assessment Type of Contact: Face-to-Face  Telemedicine Service Delivery:   Is this Initial or Reassessment?   Date Telepsych consult ordered in [facility]:    Time Telepsych consult ordered in [facility]:    Location of Assessment: [facility]  Provider Location: [facility]   Collateral Involvement: SW contacted patient's brother for collateral.   Does Patient Have a Automotive engineer Guardian? No  Legal Guardian Contact Information: N/A  Copy of Legal Guardianship Form: -- (N/A)  Legal Guardian Notified of Arrival: -- (N/A)  Legal Guardian Notified of Pending Discharge:  -- (N/A)  If Minor and Not Living with Parent(s), Who has Custody? N/A  Is CPS involved or ever been involved? Never  Is APS involved or ever been involved? Never   Patient Determined To Be At Risk for Harm To Self or Others Based on Review of Patient Reported Information or Presenting Complaint? No  Method: No Plan (N/A, no plan or intent - states she is just frustrated with friend)  Availability of Means: No access or NA  Intent: Vague intent or NA  Notification Required: No need or identified person (N/A, no plan or intent - states she is just frustrated with friend)  Additional Information for Danger to Others Potential: -- (N/A, no plan or intent - states she is just frustrated with friend)  Additional Comments for Danger to Others Potential: N/A, no plan or intent - states she is just frustrated with friend  Are There Guns or Other Weapons in Your Home? No  Types of Guns/Weapons: N/A  Are These Weapons Safely Secured?                            -- (  N/A)  Who Could Verify You Are Able To Have These Secured: N/A  Do You Have any Outstanding Charges, Pending Court Dates, Parole/Probation? N/A  Contacted To Inform of Risk of Harm To Self or Others: Family/Significant Other:    Does Patient Present under Involuntary Commitment? No    [place] of Residence: [place]   Patient Currently Receiving the Following Fox: Not Receiving Fox   Determination of Need: Urgent (48 hours)   Options For Referral: Herbalist; Outpatient Therapy; Medication Management; Inpatient Hospitalization     CCA Biopsychosocial Patient Reported Schizophrenia/Schizoaffective Diagnosis in Past: No   Strengths: Patient's brother provides some support. Patient attempts to advocate for herself.   Mental Health Symptoms Depression:  Sleep (too much or little); Hopelessness; Increase/decrease in appetite   Duration of Depressive symptoms: Duration of  Depressive Symptoms: Greater than two weeks   Mania:  N/A   Anxiety:   Restlessness; Tension; Worrying; Sleep   Psychosis:  None   Duration of Psychotic symptoms: Duration of Psychotic Symptoms: N/A   Trauma:  None   Obsessions:  None   Compulsions:  None   Inattention:  N/A   Hyperactivity/Impulsivity:  N/A   Oppositional/Defiant Behaviors:  N/A   Emotional Irregularity:  None   Other Mood/Personality Symptoms:  NA    Mental Status Exam Appearance and self-Fox  Stature:  Average   Weight:  Average weight   Clothing:  Neat/clean   Grooming:  Normal   Cosmetic use:  None   Posture/gait:  Normal   Motor activity:  Slowed   Sensorium  Attention:  Normal   Concentration:  Normal   Orientation:  Object; Person; Place; Time   Recall/memory:  Defective in Short-term   Affect and Mood  Affect:  Depressed   Mood:  Depressed; Anxious   Relating  Eye contact:  Normal   Facial expression:  Depressed; Responsive   Attitude toward examiner:  Cooperative   Thought and Language  Speech flow: Clear and Coherent   Thought content:  Appropriate to Mood and Circumstances   Preoccupation:  None   Hallucinations:  None   Organization:  Goal-directed   Company secretary of Knowledge:  Average   Intelligence:  Average   Abstraction:  Normal   Judgement:  Impaired   Reality Testing:  Realistic   Insight:  Lacking; Gaps   Decision Making:  Normal; Vacilates   Social Functioning  Social Maturity:  Impulsive   Social Judgement:  Normal   Stress  Stressors:  Surveyor, quantity; Other (Comment) (health issues)   Coping Ability:  Exhausted; Overwhelmed   Skill Deficits:  Decision making; Self-Fox   Supports:  Support needed     Religion: Religion/Spirituality Are You A Religious Person?: Yes What is Your Religious Affiliation?: [facility] How Might This Affect Treatment?: NA  Leisure/Recreation: Leisure / Recreation Do You Have  Hobbies?: No  Exercise/Diet: Exercise/Diet Do You Exercise?: No Have You Gained or Lost A Significant Amount of Weight in the Past Six Months?: No (a few pounds) Do You Follow a Special Diet?: No Do You Have Any Trouble Sleeping?: Yes Explanation of Sleeping Difficulties: poor sleep   CCA Employment/Education Employment/Work Situation: Employment / Work Psychologist, occupational Employment Situation: Retired Passenger transport manager has Been Impacted by Current Illness: No Has Patient ever Been in Equities trader?: No  Education: Education Is Patient Currently Attending School?: No Last Grade Completed: 12 Did You Product manager?: No Did You Have An Individualized Education Program (IIEP): No Did You Have Any Difficulty  At [facility]?: No Patient's Education Has Been Impacted by Current Illness: No   CCA Family/Childhood History Family and Relationship History: Family history Marital status: Single Does patient have children?: No  Childhood History:  Childhood History By whom was/is the patient raised?: Both parents Did patient suffer any verbal/emotional/physical/sexual abuse as a child?: No Did patient suffer from severe childhood neglect?: No Has patient ever been sexually abused/assaulted/raped as an adolescent or adult?: No Was the patient ever a victim of a crime or a disaster?: No Witnessed domestic violence?: No Has patient been affected by domestic violence as an adult?: No       CCA Substance Use Alcohol/Drug Use: Alcohol / Drug Use Pain Medications: See MAR Prescriptions: See MAR Over the Counter: See MAR History of alcohol / drug use?: No history of alcohol / drug abuse                         ASAM's:  Six Dimensions of Multidimensional Assessment  Dimension 1:  Acute Intoxication and/or Withdrawal Potential:      Dimension 2:  Biomedical Conditions and Complications:      Dimension 3:  Emotional, Behavioral, or Cognitive Conditions and Complications:      Dimension 4:  Readiness to Change:     Dimension 5:  Relapse, Continued use, or Continued Problem Potential:     Dimension 6:  Recovery/Living Environment:     ASAM Severity Score:    ASAM Recommended Level of Treatment:     Substance use Disorder (SUD)    Recommendations for Fox/Supports/Treatments:    Disposition Recommendation per psychiatric provider: We recommend inpatient psychiatric hospitalization when medically cleared. Patient is under voluntary admission status at this time; please IVC if attempts to leave Fox.   DSM5 Diagnoses: Patient Active Problem List   Diagnosis Date Noted   Acute renal failure superimposed on stage 3a chronic kidney disease ([name]) 06/12/2023   MDD (major depressive disorder) 05/30/2023   Polypharmacy 05/15/2023   Hallucinations 05/10/2023   Severe recurrent major depression without psychotic features ([name]) 04/14/2023   Severe recurrent major depressive disorder with psychotic features ([name]) 03/17/2023   Hypomagnesemia 02/24/2023   Reactive airway disease 12/09/2022   GAD (generalized anxiety disorder) 12/09/2022   Peripheral neuropathy 12/09/2022   Pre-op evaluation 08/21/2022   Gross hematuria 07/27/2022   Recurrent UTI 07/21/2022   Chronic anticoagulation 07/21/2022   Chronic anemia 04/05/2022   POAG (primary open-angle glaucoma) 01/04/2022   Memory deficit 11/07/2021   Functional abdominal pain syndrome 07/29/2020   Acute diarrhea    Acute cystitis without hematuria    Atypical chest pain 07/04/2020   Low-tension glaucoma, bilateral, severe stage 03/24/2020   Right sided temporal headache 08/06/2019   Paroxysmal atrial flutter ([name])    Acute deep vein thrombosis (DVT) of left tibial vein ([name]) 07/11/2019   Health maintenance examination 12/11/2018   Type 2 diabetes mellitus with mild nonproliferative diabetic retinopathy without macular edema, bilateral ([name]) 07/16/2018   CAD, multiple vessel s/p CABG x 3    Effort angina  ([name]) 11/12/2017   Abnormal stress ECG    Dyspnea on exertion 11/04/2017   GERD (gastroesophageal reflux disease) 08/15/2017   Trigger finger 06/09/2017   Nocturnal leg movements 03/25/2017   Osteopenia 02/01/2017   Advanced Fox planning/counseling discussion 01/02/2017   Chronic kidney disease, stage 3a ([name]) 08/26/2016   Insomnia 08/26/2016   Suicidal ideation    Vitamin B12 deficiency 11/10/2015   Fatty liver disease, nonalcoholic  09/23/2014   Chronic diastolic CHF (congestive heart failure) ([name]) 08/27/2014   Positive hepatitis C antibody test 08/27/2014   Iron  deficiency 08/27/2014   NSVT (nonsustained ventricular tachycardia) ([name])    Nausea without vomiting    Autonomic orthostatic hypotension 07/11/2014   Paroxysmal atrial fibrillation ([name]) 07/08/2014   History of cerebrovascular accident (CVA) in adulthood 07/08/2014   Hypokalemia 03/23/2014   Type 2 diabetes mellitus with neurological complications ([name]) 06/02/2012   Itching 03/16/2012   Contact dermatitis 02/27/2012   Medicare annual wellness visit, initial 10/28/2011   Mixed diabetic hyperlipidemia associated with type 2 diabetes mellitus ([name])    Idiopathic angioedema 06/23/2011   Vitiligo 06/23/2011   Dermatitis 06/23/2011   Essential hypertension 05/08/2006     Referrals to Alternative Service(s): Referred to Alternative Service(s):   Place:   Date:   Time:    Referred to Alternative Service(s):   Place:   Date:   Time:    Referred to Alternative Service(s):   Place:   Date:   Time:    Referred to Alternative Service(s):   Place:   Date:   Time:     Deland LITTIE Louder, [place] Fox

## 2023-09-02 NOTE — ED Provider Notes (Signed)
 [place] EMERGENCY DEPARTMENT AT [facility] Provider Note   CSN: [id number] Arrival date & time: 09/02/23  1030     History  Chief Complaint  Patient presents with   Psychiatric Evaluation   Hyperglycemia    Maria Fox is a 78 y.o. female with PMH as listed below who presents sent from [facility] for medical evalutation for UTI and hyperglycemia. Patient went to [facility] with SI and depression symptoms. Brother reports patient has been homeless for over a year and living in her car in front of her previous significant other's home. She has been seen several times here for similar symptoms and yesterday for heat exposure. [facility] recommends inpatient placement.   Patient complains of feeling achy.  She endorses right sided lower back and buttock pain. Patient also recently received treatment for a UTI at [facility]  with 3 g of fosfomycin last night <24 hours ago. Denies f/c, nausea/vomiting, abdominal pain. States she is stil having some dysuria. Also noted at [facility] to have some hyperglycemia.  Past Medical History:  Diagnosis Date   Acute cholecystitis 07/07/2019   Acute deep vein thrombosis (DVT) of left tibial vein ([name]) 07/11/2019   Acute left PCA stroke ([name]) 07/13/2015   Angioedema    Atrial flutter ([name])    Autoimmune deficiency syndrome    CAD (coronary artery disease), native coronary artery 06/2014   Chronic diastolic CHF (congestive heart failure) ([name]) 08/27/2014   Chronic kidney disease, stage 3b ([name])    Closed fracture of maxilla ([name])    COVID-19 virus infection 03/2020   CVA (cerebral infarction) 07/2014   bilateral corona radiata - periCABG   Dermatitis    eval [name] 2011: eczema, eval [name] 2011: bx negative for lichen simplex or derm herpetiformis   DM (diabetes mellitus), type 2, uncontrolled w/neurologic complication 06/02/2012   ?autonomic neuropathy, gastroparesis (06/2014)    Epidermal cyst of neck 03/25/2017   Excised by derm Berna)   HCAP  (healthcare-associated pneumonia) 06/2014   History of chicken pox    HLD (hyperlipidemia)    Hx of migraines    remote   Hypertension    Lobar pneumonia ([name]) 03/02/2022   Maxillary fracture ([name])    Mitral regurgitation    Multiple allergies    mold, wool, dust, feathers   NSVT (nonsustained ventricular tachycardia) ([name])    Orthostatic hypotension 07/2015   Pleural effusion, left    RBBB    S/P lens implant    left side ([name])   Tricuspid regurgitation    UTI (urinary tract infection) 06/2014   Vitiligo        Home Medications Prior to Admission medications   Medication Sig Start Date End Date Taking? Authorizing Provider  acetaminophen  (TYLENOL ) 650 MG CR tablet Take 1,300 mg by mouth every 6 (six) hours as needed for pain.    [provider]  albuterol  (PROVENTIL ) (2.5 MG/3ML) 0.083% nebulizer solution Inhale 3 mLs into the lungs every 6 (six) hours as needed for wheezing or shortness of breath. 03/26/23   Jadapalle, Sree, MD  albuterol  (VENTOLIN  HFA) 108 (90 Base) MCG/ACT inhaler Inhale 1-2 puffs into the lungs every 6 (six) hours as needed for wheezing or shortness of breath. 05/15/23   Rilla Baller, MD  apixaban  (ELIQUIS ) 5 MG TABS tablet Take 1 tablet (5 mg total) by mouth 2 (two) times daily. 05/15/23   Rilla Baller, MD  ARIPiprazole  (ABILIFY ) 2 MG tablet Take 1 tablet (2 mg total) by mouth daily. 06/21/23  Jillian Buttery, MD  atorvastatin  (LIPITOR) 40 MG tablet TAKE 1 TABLET BY MOUTH EVERY EVENING 06/04/23   Rilla Baller, MD  clopidogrel  (PLAVIX ) 75 MG tablet Take 1 tablet (75 mg total) by mouth daily. 05/15/23   Rilla Baller, MD  dorzolamide -timolol  (COSOPT ) 2-0.5 % ophthalmic solution Place 1 drop into both eyes 2 (two) times daily. 04/24/23   [provider]  DULoxetine  (CYMBALTA ) 30 MG capsule Take 1 capsule (30 mg total) by mouth daily. 06/21/23   Adhikari, Amrit, MD  DULoxetine  (CYMBALTA ) 60 MG capsule Take 1 capsule (60 mg total) by  mouth at bedtime. 06/20/23   Jillian Buttery, MD  feeding supplement, GLUCERNA SHAKE, (GLUCERNA SHAKE) LIQD Take 237 mLs by mouth 3 (three) times daily between meals. Patient taking differently: Take 237 mLs by mouth in the morning and at bedtime. 03/26/23   Jadapalle, Sree, MD  glipiZIDE  (GLUCOTROL ) 10 MG tablet Take 1 tablet (10 mg total) by mouth 2 (two) times daily. Always take with food 05/15/23   Rilla Baller, MD  hydrocortisone  cream 1 % Apply topically 3 (three) times daily. Patient taking differently: Apply 1 Application topically 2 (two) times daily as needed for itching. 03/26/23   Jadapalle, Sree, MD  isosorbide  mononitrate (IMDUR ) 30 MG 24 hr tablet Take 0.5 tablets (15 mg total) by mouth daily. 05/15/23   Rilla Baller, MD  latanoprost  (XALATAN ) 0.005 % ophthalmic solution Place 1 drop into both eyes at bedtime. 03/26/23   Jadapalle, Sree, MD  melatonin 5 MG TABS Take 5 mg by mouth at bedtime as needed (sleep).    [provider]  metFORMIN  (GLUCOPHAGE -XR) 500 MG 24 hr tablet Take 1 tablet (500 mg total) by mouth daily with breakfast. 05/15/23   Rilla Baller, MD  metoprolol  succinate (TOPROL -XL) 25 MG 24 hr tablet Take 1 tablet (25 mg total) by mouth daily. 05/15/23   Rilla Baller, MD  nitroGLYCERIN  (NITROSTAT ) 0.4 MG SL tablet Place 0.4 mg under the tongue every 5 (five) minutes as needed for chest pain. 04/04/23   [provider]  pantoprazole  (PROTONIX ) 40 MG tablet Take 1 tablet (40 mg total) by mouth daily. Patient taking differently: Take 40 mg by mouth 2 (two) times daily. 05/15/23   Rilla Baller, MD  QUEtiapine  (SEROQUEL ) 50 MG tablet Take 50 mg by mouth at bedtime.    [provider]      Allergies    Bee venom, Mushroom extract complex (obsolete), Penicillins, Codeine, Sulfa  antibiotics, and Iohexol     Review of Systems   Review of Systems A 10 point review of systems was performed and is negative unless otherwise reported in  HPI.  Physical Exam Updated Vital Signs BP (!) 147/91 (BP Location: Right Arm)   Pulse (!) 110   Temp 98.3 F (36.8 C) (Oral)   Resp 16   Ht 5' 3 (1.6 m)   Wt 59.9 kg   SpO2 97%   BMI 23.38 kg/m  Physical Exam General: Normal appearing female, lying in bed.  HEENT: PERRLA, Sclera anicteric, MMM, trachea midline.  Cardiology: RRR, no murmurs/rubs/gallops.  Resp: Normal respiratory rate and effort. CTAB, no wheezes, rhonchi, crackles.  Abd: Soft, non-tender, non-distended. No rebound tenderness or guarding.  GU: Deferred. MSK: No peripheral edema or signs of trauma. Extremities without deformity or TTP. No cyanosis or clubbing. Skin: warm, dry. No rashes or lesions. Back:TTP to right buttock and right lower lumbar paraspinal muscles. No CVA TTP. No midline lumbar TTP.  Neuro: A&Ox4, CNs II-XII grossly intact.  MAEs. Sensation grossly intact.  Psych: Normal mood and affect.   ED Results / Procedures / Treatments   Labs (all labs ordered are listed, but only abnormal results are displayed) Labs Reviewed  URINALYSIS, ROUTINE W REFLEX MICROSCOPIC - Abnormal; Notable for the following components:      Result Value   APPearance CLOUDY (*)    Glucose, UA >=500 (*)    Leukocytes,Ua MODERATE (*)    Bacteria, UA RARE (*)    All other components within normal limits  COMPREHENSIVE METABOLIC PANEL WITH GFR - Abnormal; Notable for the following components:   Sodium 134 (*)    CO2 17 (*)    Glucose, Bld 352 (*)    Creatinine, Ser 1.75 (*)    GFR, Estimated 29 (*)    All other components within normal limits  ACETAMINOPHEN  LEVEL - Abnormal; Notable for the following components:   Acetaminophen  (Tylenol ), Serum <10 (*)    All other components within normal limits  SALICYLATE LEVEL - Abnormal; Notable for the following components:   Salicylate Lvl <7.0 (*)    All other components within normal limits  CBG MONITORING, ED - Abnormal; Notable for the following components:    Glucose-Capillary 343 (*)    All other components within normal limits  CBC WITH DIFFERENTIAL/PLATELET  RAPID URINE DRUG SCREEN, HOSP PERFORMED  ETHANOL  CBG MONITORING, ED    EKG EKG Interpretation Date/Time:  Tuesday September 02 2023 11:11:09 EDT Ventricular Rate:  107 PR Interval:  162 QRS Duration:  122 QT Interval:  366 QTC Calculation: 488 R Axis:   -56  Text Interpretation: Sinus tachycardia Left axis deviation Right bundle branch block Minimal voltage criteria for LVH, may be normal variant ( R in aVL ) Abnormal ECG When compared with ECG of 01-Sep-2023 16:40, No significant change since last tracing Confirmed by Towana Sharper [phone]) on 09/02/2023 11:14:00 AM  Radiology CXR: 1. Evidence of prior median sternotomy/CABG. 2. Mild left basilar atelectasis.  CT A/P: No acute abnormality in the abdomen or pelvis.   Procedures Procedures    Medications Ordered in ED Medications  lactated ringers  bolus 1,000 mL (0 mLs Intravenous Stopped 09/02/23 1752)  cefTRIAXone  (ROCEPHIN ) 1 g in sodium chloride  0.9 % 100 mL IVPB (0 g Intravenous Stopped 09/02/23 1902)    ED Course/ Medical Decision Making/ A&P                          Medical Decision Making Amount and/or Complexity of Data Reviewed Labs:  Decision-making details documented in ED Course.    This patient presents to the ED for concern of back pain, hyperglycemia; this involves an extensive number of treatment options, and is a complaint that carries with it a high risk of complications and morbidity.  I considered the following differential and admission for this acute, potentially life threatening condition.  Patient is reassuringly very well-appearing.  MDM:    She has no CVA tenderness or abdominal pain to indicate pyelonephritis or ureterolithiasis.  Believe her lower back pain is musculoskeletal and radicular, likely sciatica, especially given the tenderness to the right buttock and paraspinal muscles.  Will  obtain a repeat urine the patient was just treated within the last 24 hours.  Her hyperglycemia also improved after some fluids.  No signs of DKA, no significant electrolyte/metabolic derangements.  Patient is overall very well-appearing and hemodynamically stable, very low concern for any serious life-threatening bacterial infection or other life-threatening etiology of  her symptoms.  Patient has a Comptroller for her SI and was transferred from behalf, likely can go back to be hook after evaluation here.  Clinical Course as of 09/09/23 1540  Tue Sep 02, 2023  1628 Urinalysis, Routine w reflex microscopic -Urine, Clean Catch(!) +UTI. Positive urine yesterday as well and received a one-time dose of fosfomycin in the ED. [HN]  1628 Previous urine culture positive for Enterobacter and Proteus on 06/14/2023. Sensitivity to ceftriaxone  for enterobacter species not reported. [HN]  1631 WBC: 9.6 No leukocytosis [HN]  1632 Glucose(!): 352 Will give fluids [HN]  1655 D/w Dorn with pharmacy who states that fosfomycin should cover patient's possible UTI, and that she received it at 0100 AM, cannot expect UA to clear in <24 hours. She has urine culture from yesterday that is in process. She has allergy listed to bactrim  that is nausea/vomiting but received it in April 2025 and did okay. Will prescribe another course of bactrim   [HN]  1722 Glucose-Capillary(!): 240 [HN]  1749 Discussed extensively with pharmacy. Appears that she may have had some mild HyperK with bactrim  last time. Will treat w/ cefpodoxime  - seems that [facility] is wanting to do inpatient treatment so patient can get her abx that way. Will give ceftriaxone  IV here and then DC w/ cefpodoxime . [HN]  1807 Glucose-Capillary(!): 207 Improved hyperglycemia. Takes glipizide  and metformin  at home.  [HN]  2035 Attempted several times to reach provider at behavioral health urgent care without success [HN]  2050 Attempted x 3 to reach a provider at [facility]. RN will  call me back. She is going to find someone.  [HN]  2056 Received call from Gaither Pouch, NP with [facility], who accepts patient for transfer back to [facility]. [HN]    Clinical Course User Index [HN] Franklyn Sid SAILOR, MD    Labs: I Ordered, and personally interpreted labs.  The pertinent results include:  those listed above  Imaging Studies ordered: I ordered imaging studies including CXR, CT A/P I independently visualized and interpreted imaging. I agree with the radiologist interpretation  Additional history obtained from chart review.    Reevaluation: After the interventions noted above, I reevaluated the patient and found that they have :improved  Social Determinants of Health: Currently living in car  Disposition:  Transfer back to [facility]  Co morbidities that complicate the patient evaluation  Past Medical History:  Diagnosis Date   Acute cholecystitis 07/07/2019   Acute deep vein thrombosis (DVT) of left tibial vein ([name]) 07/11/2019   Acute left PCA stroke ([name]) 07/13/2015   Angioedema    Atrial flutter ([name])    Autoimmune deficiency syndrome    CAD (coronary artery disease), native coronary artery 06/2014   Chronic diastolic CHF (congestive heart failure) ([name]) 08/27/2014   Chronic kidney disease, stage 3b ([name])    Closed fracture of maxilla ([name])    COVID-19 virus infection 03/2020   CVA (cerebral infarction) 07/2014   bilateral corona radiata - periCABG   Dermatitis    eval [name] 2011: eczema, eval [name] 2011: bx negative for lichen simplex or derm herpetiformis   DM (diabetes mellitus), type 2, uncontrolled w/neurologic complication 06/02/2012   ?autonomic neuropathy, gastroparesis (06/2014)    Epidermal cyst of neck 03/25/2017   Excised by derm Berna)   HCAP (healthcare-associated pneumonia) 06/2014   History of chicken pox    HLD (hyperlipidemia)    Hx of migraines    remote   Hypertension    Lobar pneumonia ([name]) 03/02/2022   Maxillary  fracture [facility])     Mitral regurgitation    Multiple allergies    mold, wool, dust, feathers   NSVT (nonsustained ventricular tachycardia) ([name])    Orthostatic hypotension 07/2015   Pleural effusion, left    RBBB    S/P lens implant    left side ([name])   Tricuspid regurgitation    UTI (urinary tract infection) 06/2014   Vitiligo      Medicines No orders of the defined types were placed in this encounter.   I have reviewed the patients home medicines and have made adjustments as needed  Problem List / ED Course: Problem List Items Addressed This Visit       Other   Suicidal ideation - Primary   Other Visit Diagnoses       Lower urinary tract infectious disease       Relevant Medications   cefTRIAXone  (ROCEPHIN ) 1 g in sodium chloride  0.9 % 100 mL IVPB (Completed)     Hyperglycemia                       This note was created using dictation software, which may contain spelling or grammatical errors.    Franklyn Sid SAILOR, MD 09/12/23 [phone]

## 2023-09-02 NOTE — ED Notes (Signed)
 Family updated as to patient's status.

## 2023-09-02 NOTE — ED Notes (Signed)
 Report to [facility] done by Charge Nurse [facility]

## 2023-09-02 NOTE — ED Notes (Signed)
 Pt discharged to [facility].

## 2023-09-02 NOTE — Discharge Instructions (Addendum)
 Today you were given a one time antibiotic treatment for a urinary tract infection. Homeless shelters and call numbers attached to the back of this packet.

## 2023-09-02 NOTE — Discharge Instructions (Addendum)
 Accepted to [facility]

## 2023-09-02 NOTE — Care Management (Signed)
 OBS Care Management   Writer spoke to the patient brother Takeela Peil [phone]).  Per the patient brother the patient and her live in boyfriend has been separated for over a year .  The house belongs to the boyfriend and she has been out of the house and living in her [place] for over 1 year.  Her brother reports that the boyfriend asked her to leave when she began to have significant medical issues.  Per chart review, the patient is linked with Arboriculturist [facility] social worker) Ermalinda [phone].  Writer left  a HIPAA compliant voice mail message.    Writer left  a HIPAA compliant voice mail message for her ex-boyfriend who is lusted as her emergency contact Reda Hatter [phone])

## 2023-09-02 NOTE — ED Notes (Addendum)
 Patient presented to [facility] voluntarily unaccompanied. Pt reported being at  [facility] and was told that she had a UTI. Pt states that she doesn't have anywhere to go and they told her to come . Pt reported having thoughts to  hurt herself and somebody else. Pt states that she had SI thoughts 2 days ago with the plan to overdose. Pt states that she also had HI thoughts 2 days ago against her friend [name]. Pt states that she still feels SI and HI at this time.  Skin assessment completed and patient was found to have vitiligo. Patient has unsteady gait and was given walker for use d/t generalized weakness. Patient in bed appears asleep will continue to monitor

## 2023-09-02 NOTE — Progress Notes (Signed)
   09/02/23 0727  [facility] Triage Screening (Walk-ins at [facility] only)  How Did You Hear About Us ? Hospital Discharge  What Is the Reason for Your Visit/Call Today? Maria Fox presents to [facility] voluntarily unaccompanied. Pt states that she was at [facility] and was told that she has a UTI. Pt states that she doesn't have anywhere to go and they told her to come here. Pt shares that she asked the staff at [facility] if they would admit her to the psych ward because she wants to hurt herself and somebody else. Pt states that she had SI thoughts 2 days ago with the plan to overdose. Pt states that she also had HI thoughts 2 days ago against her friend [name]. Pt states that she still feels SI and HI at this time.  How Long Has This Been Causing You Problems? <Week  Have You Recently Had Any Thoughts About Hurting Yourself? Yes  How long ago did you have thoughts about hurting yourself? 2 days ago - plan to overdose  Are You Planning to Commit Suicide/Harm Yourself At This time? Yes  Have you Recently Had Thoughts About Hurting Someone [place]? Yes  How long ago did you have thoughts of harming others? 2 days ago - her friend [name] (friends since 1991)  Are You Planning To Harm Someone At This Time? Yes  Physical Abuse Denies  Verbal Abuse Yes, present (Comment)  Sexual Abuse Denies  Exploitation of patient/patient's resources Denies  Self-Neglect Denies  Are you currently experiencing any auditory, visual or other hallucinations? No  Have You Used Any Alcohol or Drugs in the Past 24 Hours? No  Do you have any current medical co-morbidities that require immediate attention? Yes  Please describe current medical co-morbidities that require immediate attention: UTI  Clinician description of patient physical appearance/behavior: calm, cooperative  What Do You Feel Would Help You the Most Today? Social Support  If access to [facility] was not available, would you have sought care in the Emergency Department? Yes   Determination of Need Urgent (48 hours)  Options For Referral Geropsychiatric Facility;Outpatient Therapy;Medication Management  Determination of Need filed? Yes

## 2023-09-02 NOTE — ED Provider Notes (Signed)
 [facility] Continuous Assessment Admission H&P  Date: 09/02/23 Patient Name: Maria Fox MRN: [id number] Chief Complaint: suicidal ideation and depression   Diagnoses:  Final diagnoses:  Suicidal ideation  Recurrent major depressive disorder, remission status unspecified ([name])  Ineffective coping  Urinary tract infection without hematuria, site unspecified    HPI: Maria Fox, 78 y/o female with a history of MDD, GAD presented to [facility] after she was sent to the ED for medical clearance.  See  prior admission notes: On evaluation ORVILLE WIDMANN reports ongoing history of suicidal ideations and depression.  She reports she has been in out of the hospital this year due to worsening depression due to ongoing conflict with her partner of 40 years.  She reports that her partner 40 years kicked her out of the house yesterday and she was feeling unwell and went to the emergency room and was subsequently diagnosed with a UTI.  On further clarification after social work spoke with the patient's brother, patient has been homeless for over a year and living in her car in front of her former significant other's home.  Patient has no family and requires a lot of care there for her brother is unable to take care of her and she has no other family.  Has not been taking her medications consistently due to her limitations related to being homeless.  On review of ED visit it was a long patient's blood sugar was 385.  On recheck here at [facility] blood sugars 391. Patient also has a significant history of multiple falls and incontinence which often requires adult diapers.  Patient has been hospitalized multiple times this year and unfortunately has not had any assistance in being appropriately placed at any inpatient facility or assisted living facility.  Per chart review patient has been assigned a Child psychotherapist within the health system however it is unclear as to what the status is of placement in permanent housing.   Patient reports that she took a bus here from [employer] and reports that she was told to come here for mental health evaluation.   During evaluation Maria LITTIE Fox is sitting, shivering draped blanket, in no acute distress.  She is alert/oriented, however, some of her statements are confusing as to her most recent living situation as she reports that she was living at her significant's other's home up until yesterday.  However after social work speaking with her brother patient has actually been homeless for over a year.  Patient is cooperative.  Patient appears chronically ill, thin appearing and appears unhealthy. She is speaking in a clear tone at moderate volume, and normal pace; with good eye contact.  Her thought process is coherent and relevant; There is no indication that she is currently responding to internal/external stimuli or experiencing delusional thought content.  Patient endorses a suicidal thoughts over the last 2 days with a plan to overdose on all of her medications.  Patient endorses that she would like to end the life of her significant other but denies any specific plan and denies having means to do so  Face-to-face evaluation of patient, patient is alert and oriented x 4, speech is clear, maintain eye contact.  Patient appearance is fairly groomed for the environment.  The patient she does not know where she is clinical, patient reports that she was living with her boyfriend with the so she is hoping that she can get from social work to get into an assisted living.  According to the patient she is currently show.  Patient does appear to be related more alert than when she first came in.  Patient does not seem to be influenced by internal or external stimuli.  Does not have any immediate plans to hurt herself.  Pt came back from the ED with new order for antibiotic for UTI Will continue ED abx order of cefpodoxime (VANTIN) 100 mg BID x 7 days. Will restart home  medications  Will admit patient to observation unit until a bed become available inpatient.   Total Time spent with patient: 20 minutes  Musculoskeletal  Strength & Muscle Tone: within normal limits Gait & Station: normal Patient leans: N/A  Psychiatric Specialty Exam  Presentation General Appearance:  Appropriate for Environment  Eye Contact: Fair  Speech: Normal Rate  Speech Volume: Normal  Handedness: Right   Mood and Affect  Mood: Anxious; Depressed  Affect: Flat   Thought Process  Thought Processes: Coherent  Descriptions of Associations:Intact  Orientation:Full (Time, Place and Person)  Thought Content:Scattered  Diagnosis of Schizophrenia or Schizoaffective disorder in past: No   Hallucinations:Hallucinations: None  Ideas of Reference:None  Suicidal Thoughts:Suicidal Thoughts: Yes, Passive SI Active Intent and/or Plan: With Plan SI Passive Intent and/or Plan: With Plan  Homicidal Thoughts:Homicidal Thoughts: Yes, Passive HI Passive Intent and/or Plan: Without Plan   Sensorium  Memory: Immediate Fair  Judgment: Poor  Insight: Fair   Chartered certified accountant: Poor  Attention Span: Poor  Recall: [employer] of Knowledge: Fair  Language: Fair   Psychomotor Activity  Psychomotor Activity: Psychomotor Activity: Normal   Assets  Assets: Desire for Improvement; Housing; Social Support   Sleep  Sleep: Sleep: Fair Number of Hours of Sleep: 6   Nutritional Assessment (For OBS and FBC admissions only) Has the patient had a weight loss or gain of 10 pounds or more in the last 3 months?: Yes Has the patient had a decrease in food intake/or appetite?: Yes Does the patient have dental problems?: No Does the patient have eating habits or behaviors that may be indicators of an eating disorder including binging or inducing vomiting?: No Has the patient recently lost weight without trying?: 0 Has the patient been  eating poorly because of a decreased appetite?: 0 Malnutrition Screening Tool Score: 0    Physical Exam HENT:     Head: Normocephalic.     Nose: Nose normal.   Eyes:     Pupils: Pupils are equal, round, and reactive to light.    Cardiovascular:     Rate and Rhythm: Tachycardia present.  Pulmonary:     Effort: Pulmonary effort is normal.   Musculoskeletal:        General: Normal range of motion.     Cervical back: Normal range of motion.   Neurological:     General: No focal deficit present.     Mental Status: She is alert.   Psychiatric:        Mood and Affect: Mood normal.        Behavior: Behavior normal.        Thought Content: Thought content normal.        Judgment: Judgment normal.    Review of Systems  Constitutional: Negative.   HENT: Negative.    Eyes: Negative.   Respiratory: Negative.    Cardiovascular: Negative.   Gastrointestinal: Negative.   Genitourinary: Negative.   Musculoskeletal: Negative.   Skin: Negative.   Neurological: Negative.   Psychiatric/Behavioral:  Positive for  depression and suicidal ideas. The patient is nervous/anxious.     Blood pressure (!) 155/81, pulse (!) 103, temperature 97.7 F (36.5 C), temperature source Oral, resp. rate 18, SpO2 98%. There is no height or weight on file to calculate BMI.  Past Psychiatric History: MDD, GAD   Is the patient at risk to self? Yes  Has the patient been a risk to self in the past 6 months? Yes .    Has the patient been a risk to self within the distant past? Yes   Is the patient a risk to others? Yes   Has the patient been a risk to others in the past 6 months? No   Has the patient been a risk to others within the distant past? No   Past Medical History: see chart  Family History: unknown   Social History: denies   Last Labs:  Admission on 09/02/2023, Discharged on 09/02/2023  Component Date Value Ref Range Status   Color, Urine 09/02/2023 YELLOW  YELLOW Final   APPearance  09/02/2023 CLOUDY (A)  CLEAR Final   Specific Gravity, Urine 09/02/2023 1.021  1.005 - 1.030 Final   pH 09/02/2023 5.0  5.0 - 8.0 Final   Glucose, UA 09/02/2023 >=500 (A)  NEGATIVE mg/dL Final   Hgb urine dipstick 09/02/2023 NEGATIVE  NEGATIVE Final   Bilirubin Urine 09/02/2023 NEGATIVE  NEGATIVE Final   Ketones, ur 09/02/2023 NEGATIVE  NEGATIVE mg/dL Final   Protein, ur 93/75/7974 NEGATIVE  NEGATIVE mg/dL Final   Nitrite 93/75/7974 NEGATIVE  NEGATIVE Final   Leukocytes,Ua 09/02/2023 MODERATE (A)  NEGATIVE Final   RBC / HPF 09/02/2023 0-5  0 - 5 RBC/hpf Final   WBC, UA 09/02/2023 >50  0 - 5 WBC/hpf Final   Bacteria, UA 09/02/2023 RARE (A)  NONE SEEN Final   Squamous Epithelial / HPF 09/02/2023 0-5  0 - 5 /HPF Final   WBC Clumps 09/02/2023 PRESENT   Final   Mucus 09/02/2023 PRESENT   Final   Performed at [facility], [address] [place], [place] [address]   Glucose-Capillary 09/02/2023 343 (H)  70 - 99 mg/dL Final   Glucose reference range applies only to samples taken after fasting for at least 8 hours.   WBC 09/02/2023 9.6  4.0 - 10.5 K/uL Final   RBC 09/02/2023 4.13  3.87 - 5.11 MIL/uL Final   Hemoglobin 09/02/2023 12.0  12.0 - 15.0 g/dL Final   HCT 93/75/7974 36.6  36.0 - 46.0 % Final   MCV 09/02/2023 88.6  80.0 - 100.0 fL Final   MCH 09/02/2023 29.1  26.0 - 34.0 pg Final   MCHC 09/02/2023 32.8  30.0 - 36.0 g/dL Final   RDW 93/75/7974 14.2  11.5 - 15.5 % Final   Platelets 09/02/2023 269  150 - 400 K/uL Final   nRBC 09/02/2023 0.0  0.0 - 0.2 % Final   Neutrophils Relative % 09/02/2023 69  % Final   Neutro Abs 09/02/2023 6.7  1.7 - 7.7 K/uL Final   Lymphocytes Relative 09/02/2023 22  % Final   Lymphs Abs 09/02/2023 2.1  0.7 - 4.0 K/uL Final   Monocytes Relative 09/02/2023 6  % Final   Monocytes Absolute 09/02/2023 0.5  0.1 - 1.0 K/uL Final   Eosinophils Relative 09/02/2023 2  % Final   Eosinophils Absolute 09/02/2023 0.2  0.0 - 0.5 K/uL Final   Basophils Relative  09/02/2023 1  % Final   Basophils Absolute 09/02/2023 0.1  0.0 - 0.1  K/uL Final   Immature Granulocytes 09/02/2023 0  % Final   Abs Immature Granulocytes 09/02/2023 0.04  0.00 - 0.07 K/uL Final   Performed at [facility], [address], [place], [place] [address]   Opiates 09/02/2023 NONE DETECTED  NONE DETECTED Final   Cocaine 09/02/2023 NONE DETECTED  NONE DETECTED Final   Benzodiazepines 09/02/2023 NONE DETECTED  NONE DETECTED Final   Amphetamines 09/02/2023 NONE DETECTED  NONE DETECTED Final   Tetrahydrocannabinol 09/02/2023 NONE DETECTED  NONE DETECTED Final   Barbiturates 09/02/2023 NONE DETECTED  NONE DETECTED Final   Comment: (NOTE) DRUG SCREEN FOR MEDICAL PURPOSES ONLY.  IF CONFIRMATION IS NEEDED FOR ANY PURPOSE, NOTIFY LAB WITHIN 5 DAYS.  LOWEST DETECTABLE LIMITS FOR URINE DRUG SCREEN Drug Class                     Cutoff (ng/mL) Amphetamine and metabolites    1000 Barbiturate and metabolites    200 Benzodiazepine                 200 Opiates and metabolites        300 Cocaine and metabolites        300 THC                            50 Performed at [facility], [address], [place], [place] [address]    Sodium 09/02/2023 134 (L)  135 - 145 mmol/L Final   Potassium 09/02/2023 3.9  3.5 - 5.1 mmol/L Final   Chloride 09/02/2023 105  98 - 111 mmol/L Final   CO2 09/02/2023 17 (L)  22 - 32 mmol/L Final   Glucose, Bld 09/02/2023 352 (H)  70 - 99 mg/dL Final   Glucose reference range applies only to samples taken after fasting for at least 8 hours.   BUN 09/02/2023 20  8 - 23 mg/dL Final   Creatinine, Ser 09/02/2023 1.75 (H)  0.44 - 1.00 mg/dL Final   Calcium  09/02/2023 9.2  8.9 - 10.3 mg/dL Final   Total Protein 93/75/7974 7.1  6.5 - 8.1 g/dL Final   Albumin 93/75/7974 3.7  3.5 - 5.0 g/dL Final   AST 93/75/7974 20  15 - 41 U/L Final   ALT 09/02/2023 12  0 - 44 U/L Final   Alkaline Phosphatase 09/02/2023 70  38 - 126 U/L Final   Total Bilirubin 09/02/2023  0.7  0.0 - 1.2 mg/dL Final   GFR, Estimated 09/02/2023 29 (L)  >60 mL/min Final   Comment: (NOTE) Calculated using the CKD-EPI Creatinine Equation (2021)    Anion gap 09/02/2023 12  5 - 15 Final   Performed at [facility], [address], [place], [place] [address]   Acetaminophen  (Tylenol ), Serum 09/02/2023 <10 (L)  10 - 30 ug/mL Final   Comment: (NOTE) Therapeutic concentrations vary significantly. A range of 10-30 ug/mL  may be an effective concentration for many patients. However, some  are best treated at concentrations outside of this range. Acetaminophen  concentrations >150 ug/mL at 4 hours after ingestion  and >50 ug/mL at 12 hours after ingestion are often associated with  toxic reactions.  Performed at [facility], [address], [place], [place] [address]    Salicylate Lvl 09/02/2023 <7.0 (L)  7.0 - 30.0 mg/dL Final   Performed at [facility], [address], [place], [place] [address]   Alcohol,  Ethyl (B) 09/02/2023 <15  <15 mg/dL Final   Comment: (NOTE) For medical purposes only. Performed at [facility], [address], [place], [place] [address]    Glucose-Capillary 09/02/2023 240 (H)  70 - 99 mg/dL Final   Glucose reference range applies only to samples taken after fasting for at least 8 hours.   Glucose-Capillary 09/02/2023 207 (H)  70 - 99 mg/dL Final   Glucose reference range applies only to samples taken after fasting for at least 8 hours.  Admission on 09/02/2023, Discharged on 09/02/2023  Component Date Value Ref Range Status   Glucose-Capillary 09/02/2023 391 (H)  70 - 99 mg/dL Final   Glucose reference range applies only to samples taken after fasting for at least 8 hours.  Admission on 09/01/2023, Discharged on 09/02/2023  Component Date Value Ref Range Status   Glucose-Capillary 09/01/2023 385 (H)  70 - 99 mg/dL Final   Glucose reference range applies only to samples taken after fasting for at least 8 hours.   Sodium  09/01/2023 128 (L)  135 - 145 mmol/L Final   Potassium 09/01/2023 4.0  3.5 - 5.1 mmol/L Final   Chloride 09/01/2023 95 (L)  98 - 111 mmol/L Final   CO2 09/01/2023 16 (L)  22 - 32 mmol/L Final   Glucose, Bld 09/01/2023 351 (H)  70 - 99 mg/dL Final   Glucose reference range applies only to samples taken after fasting for at least 8 hours.   BUN 09/01/2023 20  8 - 23 mg/dL Final   Creatinine, Ser 09/01/2023 1.78 (H)  0.44 - 1.00 mg/dL Final   Calcium  09/01/2023 9.3  8.9 - 10.3 mg/dL Final   Total Protein 93/76/7974 7.3  6.5 - 8.1 g/dL Final   Albumin 93/76/7974 4.1  3.5 - 5.0 g/dL Final   AST 93/76/7974 20  15 - 41 U/L Final   ALT 09/01/2023 12  0 - 44 U/L Final   Alkaline Phosphatase 09/01/2023 82  38 - 126 U/L Final   Total Bilirubin 09/01/2023 0.9  0.0 - 1.2 mg/dL Final   GFR, Estimated 09/01/2023 29 (L)  >60 mL/min Final   Comment: (NOTE) Calculated using the CKD-EPI Creatinine Equation (2021)    Anion gap 09/01/2023 17 (H)  5 - 15 Final   Performed at [facility], [address], [place], [place] [address]   Color, Urine 09/01/2023 YELLOW  YELLOW Final   APPearance 09/01/2023 CLOUDY (A)  CLEAR Final   Specific Gravity, Urine 09/01/2023 1.005  1.005 - 1.030 Final   pH 09/01/2023 5.0  5.0 - 8.0 Final   Glucose, UA 09/01/2023 150 (A)  NEGATIVE mg/dL Final   Hgb urine dipstick 09/01/2023 NEGATIVE  NEGATIVE Final   Bilirubin Urine 09/01/2023 NEGATIVE  NEGATIVE Final   Ketones, ur 09/01/2023 NEGATIVE  NEGATIVE mg/dL Final   Protein, ur 93/76/7974 NEGATIVE  NEGATIVE mg/dL Final   Nitrite 93/76/7974 NEGATIVE  NEGATIVE Final   Leukocytes,Ua 09/01/2023 LARGE (A)  NEGATIVE Final   RBC / HPF 09/01/2023 6-10  0 - 5 RBC/hpf Final   WBC, UA 09/01/2023 >50  0 - 5 WBC/hpf Final   Bacteria, UA 09/01/2023 MANY (A)  NONE SEEN Final   Squamous Epithelial / HPF 09/01/2023 0-5  0 - 5 /HPF Final   WBC Clumps 09/01/2023 PRESENT   Final   Mucus 09/01/2023 PRESENT   Final   Performed  at [facility], [address], [place], [place] [address]   WBC 09/01/2023  7.9  4.0 - 10.5 K/uL Final   RBC 09/01/2023 4.34  3.87 - 5.11 MIL/uL Final   Hemoglobin 09/01/2023 12.6  12.0 - 15.0 g/dL Final   HCT 93/76/7974 38.9  36.0 - 46.0 % Final   MCV 09/01/2023 89.6  80.0 - 100.0 fL Final   MCH 09/01/2023 29.0  26.0 - 34.0 pg Final   MCHC 09/01/2023 32.4  30.0 - 36.0 g/dL Final   RDW 93/76/7974 14.1  11.5 - 15.5 % Final   Platelets 09/01/2023 268  150 - 400 K/uL Final   nRBC 09/01/2023 0.0  0.0 - 0.2 % Final   Neutrophils Relative % 09/01/2023 62  % Final   Neutro Abs 09/01/2023 4.9  1.7 - 7.7 K/uL Final   Lymphocytes Relative 09/01/2023 27  % Final   Lymphs Abs 09/01/2023 2.1  0.7 - 4.0 K/uL Final   Monocytes Relative 09/01/2023 8  % Final   Monocytes Absolute 09/01/2023 0.6  0.1 - 1.0 K/uL Final   Eosinophils Relative 09/01/2023 2  % Final   Eosinophils Absolute 09/01/2023 0.2  0.0 - 0.5 K/uL Final   Basophils Relative 09/01/2023 1  % Final   Basophils Absolute 09/01/2023 0.1  0.0 - 0.1 K/uL Final   Immature Granulocytes 09/01/2023 0  % Final   Abs Immature Granulocytes 09/01/2023 0.03  0.00 - 0.07 K/uL Final   Performed at [facility], [address] [place], [place] [address]   Troponin I (High Sensitivity) 09/01/2023 16  <18 ng/L Final   Comment: (NOTE) Elevated high sensitivity troponin I (hsTnI) values and significant  changes across serial measurements may suggest ACS but many other  chronic and acute conditions are known to elevate hsTnI results.  Refer to the Links section for chest pain algorithms and additional  guidance. Performed at [facility], [address] [place], [place] [address]    Total CK 09/01/2023 63  38 - 234 U/L Final   Performed at [facility], [address] [place], [place] [address]   Troponin I (High Sensitivity) 09/01/2023 17  <18 ng/L Final   Comment:  (NOTE) Elevated high sensitivity troponin I (hsTnI) values and significant  changes across serial measurements may suggest ACS but many other  chronic and acute conditions are known to elevate hsTnI results.  Refer to the Links section for chest pain algorithms and additional  guidance. Performed at [facility], [address] [place], [place] [address]   No results displayed because visit has over 200 results.    Admission on 05/28/2023, Discharged on 06/06/2023  Component Date Value Ref Range Status   Sodium 05/28/2023 134 (L)  135 - 145 mmol/L Final   Potassium 05/28/2023 3.6  3.5 - 5.1 mmol/L Final   Chloride 05/28/2023 107  98 - 111 mmol/L Final   CO2 05/28/2023 18 (L)  22 - 32 mmol/L Final   Glucose, Bld 05/28/2023 294 (H)  70 - 99 mg/dL Final   Glucose reference range applies only to samples taken after fasting for at least 8 hours.   BUN 05/28/2023 21  8 - 23 mg/dL Final   Creatinine, Ser 05/28/2023 1.50 (H)  0.44 - 1.00 mg/dL Final   Calcium  05/28/2023 8.6 (L)  8.9 - 10.3 mg/dL Final   Total Protein 96/80/7974 7.1  6.5 - 8.1 g/dL Final   Albumin 96/80/7974 3.7  3.5 - 5.0 g/dL Final   AST 96/80/7974 15  15 - 41 U/L Final  ALT 05/28/2023 12  0 - 44 U/L Final   Alkaline Phosphatase 05/28/2023 71  38 - 126 U/L Final   Total Bilirubin 05/28/2023 0.9  0.0 - 1.2 mg/dL Final   GFR, Estimated 05/28/2023 35 (L)  >60 mL/min Final   Comment: (NOTE) Calculated using the CKD-EPI Creatinine Equation (2021)    Anion gap 05/28/2023 9  5 - 15 Final   Performed at [facility], [address], [place], [place] [address]   Alcohol, Ethyl (B) 05/28/2023 <10  <10 mg/dL Final   Comment: (NOTE) Lowest detectable limit for serum alcohol is 10 mg/dL.  For medical purposes only. Performed at [facility], [address], [place], [place] [address]    Salicylate Lvl 05/28/2023 <7.0 (L)  7.0 - 30.0 mg/dL Final   Performed at [facility], [address], [place], [place] [address]   Acetaminophen  (Tylenol ), Serum 05/28/2023 <10 (L)  10 - 30 ug/mL Final   Comment: (NOTE) Therapeutic concentrations vary significantly. A range of 10-30 ug/mL  may be an effective concentration for many patients. However, some  are best treated at concentrations outside of this range. Acetaminophen  concentrations >150 ug/mL at 4 hours after ingestion  and >50 ug/mL at 12 hours after ingestion are often associated with  toxic reactions.  Performed at [facility], [address], [place], [place] [address]    WBC 05/28/2023 8.8  4.0 - 10.5 K/uL Final   RBC 05/28/2023 4.06  3.87 - 5.11 MIL/uL Final   Hemoglobin 05/28/2023 11.6 (L)  12.0 - 15.0 g/dL Final   HCT 96/80/7974 36.3  36.0 - 46.0 % Final   MCV 05/28/2023 89.4  80.0 - 100.0 fL Final   MCH 05/28/2023 28.6  26.0 - 34.0 pg Final   MCHC 05/28/2023 32.0  30.0 - 36.0 g/dL Final   RDW 96/80/7974 14.1  11.5 - 15.5 % Final   Platelets 05/28/2023 233  150 - 400 K/uL Final   nRBC 05/28/2023 0.0  0.0 - 0.2 % Final   Performed at [facility], [address], [place], [place] [address]   Opiates 05/28/2023 NONE DETECTED  NONE DETECTED Final   Cocaine 05/28/2023 NONE DETECTED  NONE DETECTED Final   Benzodiazepines 05/28/2023 NONE DETECTED  NONE DETECTED Final   Amphetamines 05/28/2023 NONE DETECTED  NONE DETECTED Final   Tetrahydrocannabinol 05/28/2023 NONE DETECTED  NONE DETECTED Final   Barbiturates 05/28/2023 NONE DETECTED  NONE DETECTED Final   Comment: (NOTE) DRUG SCREEN FOR MEDICAL PURPOSES ONLY.  IF CONFIRMATION IS NEEDED FOR ANY PURPOSE, NOTIFY LAB WITHIN 5 DAYS.  LOWEST DETECTABLE LIMITS FOR URINE DRUG SCREEN Drug Class                     Cutoff (ng/mL) Amphetamine and metabolites    1000 Barbiturate and metabolites    200 Benzodiazepine                 200 Opiates and metabolites        300 Cocaine and metabolites         300 THC                            50 Performed at [facility], [address] [place], [place] [address]    Color, Urine 05/28/2023 YELLOW  YELLOW Final   APPearance 05/28/2023 CLEAR  CLEAR Final  Specific Gravity, Urine 05/28/2023 1.015  1.005 - 1.030 Final   pH 05/28/2023 5.0  5.0 - 8.0 Final   Glucose, UA 05/28/2023 NEGATIVE  NEGATIVE mg/dL Final   Hgb urine dipstick 05/28/2023 NEGATIVE  NEGATIVE Final   Bilirubin Urine 05/28/2023 NEGATIVE  NEGATIVE Final   Ketones, ur 05/28/2023 NEGATIVE  NEGATIVE mg/dL Final   Protein, ur 96/80/7974 NEGATIVE  NEGATIVE mg/dL Final   Nitrite 96/80/7974 NEGATIVE  NEGATIVE Final   Leukocytes,Ua 05/28/2023 MODERATE (A)  NEGATIVE Final   RBC / HPF 05/28/2023 0-5  0 - 5 RBC/hpf Final   WBC, UA 05/28/2023 11-20  0 - 5 WBC/hpf Final   Bacteria, UA 05/28/2023 RARE (A)  NONE SEEN Final   Squamous Epithelial / HPF 05/28/2023 0-5  0 - 5 /HPF Final   Mucus 05/28/2023 PRESENT   Final   Hyaline Casts, UA 05/28/2023 PRESENT   Final   Performed at [facility], [address], [place], [place] [address]   Glucose-Capillary 05/28/2023 183 (H)  70 - 99 mg/dL Final   Glucose reference range applies only to samples taken after fasting for at least 8 hours.   Glucose-Capillary 05/29/2023 159 (H)  70 - 99 mg/dL Final   Glucose reference range applies only to samples taken after fasting for at least 8 hours.   Glucose-Capillary 05/31/2023 190 (H)  70 - 99 mg/dL Final   Glucose reference range applies only to samples taken after fasting for at least 8 hours.   Comment 1 05/31/2023 Notify RN   Final   Glucose-Capillary 05/31/2023 255 (H)  70 - 99 mg/dL Final   Glucose reference range applies only to samples taken after fasting for at least 8 hours.   Comment 1 05/31/2023 Notify RN   Final   Glucose-Capillary 05/31/2023 187 (H)  70 - 99 mg/dL Final   Glucose reference range applies only to samples taken after fasting for at  least 8 hours.   Comment 1 05/31/2023 Notify RN   Final  Admission on 05/15/2023, Discharged on 05/23/2023  Component Date Value Ref Range Status   Sodium 05/15/2023 135  135 - 145 mmol/L Final   Potassium 05/15/2023 4.1  3.5 - 5.1 mmol/L Final   Chloride 05/15/2023 105  98 - 111 mmol/L Final   CO2 05/15/2023 19 (L)  22 - 32 mmol/L Final   Glucose, Bld 05/15/2023 295 (H)  70 - 99 mg/dL Final   Glucose reference range applies only to samples taken after fasting for at least 8 hours.   BUN 05/15/2023 17  8 - 23 mg/dL Final   Creatinine, Ser 05/15/2023 1.25 (H)  0.44 - 1.00 mg/dL Final   Calcium  05/15/2023 9.4  8.9 - 10.3 mg/dL Final   Total Protein 96/93/7974 7.7  6.5 - 8.1 g/dL Final   Albumin 96/93/7974 4.1  3.5 - 5.0 g/dL Final   AST 96/93/7974 19  15 - 41 U/L Final   ALT 05/15/2023 15  0 - 44 U/L Final   Alkaline Phosphatase 05/15/2023 85  38 - 126 U/L Final   Total Bilirubin 05/15/2023 0.7  0.0 - 1.2 mg/dL Final   GFR, Estimated 05/15/2023 44 (L)  >60 mL/min Final   Comment: (NOTE) Calculated using the CKD-EPI Creatinine Equation (2021)    Anion gap 05/15/2023 11  5 - 15 Final   Performed at [facility], [address] [place], [place] [address]   Alcohol, Ethyl (B) 05/15/2023 <10  <10 mg/dL Final  Comment: (NOTE) Lowest detectable limit for serum alcohol is 10 mg/dL.  For medical purposes only. Performed at [facility], [address] [place], [place] [address]    Salicylate Lvl 05/15/2023 <7.0 (L)  7.0 - 30.0 mg/dL Final   Performed at [facility], [address] [place], [place] [address]   Acetaminophen  (Tylenol ), Serum 05/15/2023 <10 (L)  10 - 30 ug/mL Final   Comment: (NOTE) Therapeutic concentrations vary significantly. A range of 10-30 ug/mL  may be an effective concentration for many patients. However, some  are best treated at concentrations outside of this range. Acetaminophen  concentrations >150  ug/mL at 4 hours after ingestion  and >50 ug/mL at 12 hours after ingestion are often associated with  toxic reactions.  Performed at [facility], [address] [place], [place] [address]    WBC 05/15/2023 8.6  4.0 - 10.5 K/uL Final   RBC 05/15/2023 4.27  3.87 - 5.11 MIL/uL Final   Hemoglobin 05/15/2023 12.3  12.0 - 15.0 g/dL Final   HCT 96/93/7974 37.4  36.0 - 46.0 % Final   MCV 05/15/2023 87.6  80.0 - 100.0 fL Final   MCH 05/15/2023 28.8  26.0 - 34.0 pg Final   MCHC 05/15/2023 32.9  30.0 - 36.0 g/dL Final   RDW 96/93/7974 13.6  11.5 - 15.5 % Final   Platelets 05/15/2023 290  150 - 400 K/uL Final   nRBC 05/15/2023 0.0  0.0 - 0.2 % Final   Performed at [facility], [address] [place], [place] [address]   Opiates 05/15/2023 NONE DETECTED  NONE DETECTED Final   Cocaine 05/15/2023 NONE DETECTED  NONE DETECTED Final   Benzodiazepines 05/15/2023 NONE DETECTED  NONE DETECTED Final   Amphetamines 05/15/2023 NONE DETECTED  NONE DETECTED Final   Tetrahydrocannabinol 05/15/2023 NONE DETECTED  NONE DETECTED Final   Barbiturates 05/15/2023 NONE DETECTED  NONE DETECTED Final   Comment: (NOTE) DRUG SCREEN FOR MEDICAL PURPOSES ONLY.  IF CONFIRMATION IS NEEDED FOR ANY PURPOSE, NOTIFY LAB WITHIN 5 DAYS.  LOWEST DETECTABLE LIMITS FOR URINE DRUG SCREEN Drug Class                     Cutoff (ng/mL) Amphetamine and metabolites    1000 Barbiturate and metabolites    200 Benzodiazepine                 200 Opiates and metabolites        300 Cocaine and metabolites        300 THC                            50 Performed at [facility], [address], [place], [place] [address]    Glucose-Capillary 05/15/2023 309 (H)  70 - 99 mg/dL Final   Glucose reference range applies only to samples taken after fasting for at least 8 hours.   Glucose-Capillary 05/16/2023 282 (H)  70 - 99 mg/dL Final   Glucose reference range applies only to  samples taken after fasting for at least 8 hours.   Comment 1 05/16/2023 Notify RN   Final   Glucose-Capillary 05/16/2023 345 (H)  70 - 99 mg/dL Final   Glucose reference range applies only to samples taken after fasting for at least 8 hours.   Comment 1 05/16/2023 Notify RN   Final   Glucose-Capillary 05/16/2023 271 (H)  70 -  99 mg/dL Final   Glucose reference range applies only to samples taken after fasting for at least 8 hours.   Comment 1 05/16/2023 Notify RN   Final   Glucose-Capillary 05/17/2023 111 (H)  70 - 99 mg/dL Final   Glucose reference range applies only to samples taken after fasting for at least 8 hours.   Glucose-Capillary 05/17/2023 252 (H)  70 - 99 mg/dL Final   Glucose reference range applies only to samples taken after fasting for at least 8 hours.   Glucose-Capillary 05/17/2023 246 (H)  70 - 99 mg/dL Final   Glucose reference range applies only to samples taken after fasting for at least 8 hours.   Color, Urine 05/17/2023 YELLOW  YELLOW Final   APPearance 05/17/2023 CLOUDY (A)  CLEAR Final   Specific Gravity, Urine 05/17/2023 1.016  1.005 - 1.030 Final   pH 05/17/2023 5.0  5.0 - 8.0 Final   Glucose, UA 05/17/2023 NEGATIVE  NEGATIVE mg/dL Final   Hgb urine dipstick 05/17/2023 NEGATIVE  NEGATIVE Final   Bilirubin Urine 05/17/2023 NEGATIVE  NEGATIVE Final   Ketones, ur 05/17/2023 NEGATIVE  NEGATIVE mg/dL Final   Protein, ur 96/91/7974 30 (A)  NEGATIVE mg/dL Final   Nitrite 96/91/7974 NEGATIVE  NEGATIVE Final   Leukocytes,Ua 05/17/2023 LARGE (A)  NEGATIVE Final   RBC / HPF 05/17/2023 21-50  0 - 5 RBC/hpf Final   WBC, UA 05/17/2023 >50  0 - 5 WBC/hpf Final   Bacteria, UA 05/17/2023 MANY (A)  NONE SEEN Final   Squamous Epithelial / HPF 05/17/2023 0-5  0 - 5 /HPF Final   WBC Clumps 05/17/2023 PRESENT   Final   Performed at [facility], [address], [place], [place] [address]   Specimen Description 05/17/2023    Final                    Value:URINE, CLEAN CATCH Performed at [facility], [address] [place], [place] [address]    Special Requests 05/17/2023    Final                   Value:NONE Performed at [facility], [address], [place], [place] [address]    Culture 05/17/2023 >=100,000 COLONIES/mL CITROBACTER FREUNDII (A)   Final   Report Status 05/17/2023 05/19/2023 FINAL   Final   Organism ID, Bacteria 05/17/2023 CITROBACTER FREUNDII (A)   Final   Glucose-Capillary 05/17/2023 94  70 - 99 mg/dL Final   Glucose reference range applies only to samples taken after fasting for at least 8 hours.   Glucose-Capillary 05/18/2023 105 (H)  70 - 99 mg/dL Final   Glucose reference range applies only to samples taken after fasting for at least 8 hours.   Glucose-Capillary 05/18/2023 153 (H)  70 - 99 mg/dL Final   Glucose reference range applies only to samples taken after fasting for at least 8 hours.   Glucose-Capillary 05/19/2023 65 (L)  70 - 99 mg/dL Final   Glucose reference range applies only to samples taken after fasting for at least 8 hours.   Glucose-Capillary 05/19/2023 100 (H)  70 - 99 mg/dL Final   Glucose reference range applies only to samples taken after fasting for at least 8 hours.   Glucose-Capillary 05/19/2023 145 (H)  70 - 99 mg/dL Final   Glucose reference range applies only to samples taken after fasting for at least 8 hours.   Comment 1 05/19/2023 Notify RN  Final   Glucose-Capillary 05/19/2023 214 (H)  70 - 99 mg/dL Final   Glucose reference range applies only to samples taken after fasting for at least 8 hours.   Comment 1 05/19/2023 Notify RN   Final   Glucose-Capillary 05/19/2023 158 (H)  70 - 99 mg/dL Final   Glucose reference range applies only to samples taken after fasting for at least 8 hours.   Glucose-Capillary 05/20/2023 185 (H)  70 - 99 mg/dL Final   Glucose reference range applies only to samples taken after fasting for at least 8 hours.    Glucose-Capillary 05/20/2023 144 (H)  70 - 99 mg/dL Final   Glucose reference range applies only to samples taken after fasting for at least 8 hours.   Glucose-Capillary 05/21/2023 228 (H)  70 - 99 mg/dL Final   Glucose reference range applies only to samples taken after fasting for at least 8 hours.   Comment 1 05/21/2023 Notify RN   Final   Glucose-Capillary 05/22/2023 166 (H)  70 - 99 mg/dL Final   Glucose reference range applies only to samples taken after fasting for at least 8 hours.   Comment 1 05/22/2023 Notify RN   Final   Glucose-Capillary 05/22/2023 166 (H)  70 - 99 mg/dL Final   Glucose reference range applies only to samples taken after fasting for at least 8 hours.   Comment 1 05/22/2023 Notify RN   Final   Glucose-Capillary 05/22/2023 186 (H)  70 - 99 mg/dL Final   Glucose reference range applies only to samples taken after fasting for at least 8 hours.  Office Visit on 05/14/2023  Component Date Value Ref Range Status   Color, UA 05/14/2023 yellow   Final   Clarity, UA 05/14/2023 cloudy   Final   Glucose, UA 05/14/2023 Negative  Negative Final   Bilirubin, UA 05/14/2023 negative   Final   Ketones, UA 05/14/2023 negative   Final   Spec Grav, UA 05/14/2023 1.020  1.010 - 1.025 Final   Blood, UA 05/14/2023 negative   Final   pH, UA 05/14/2023 5.5  5.0 - 8.0 Final   Protein, UA 05/14/2023 Negative  Negative Final   Urobilinogen, UA 05/14/2023 0.2  0.2 or 1.0 E.U./dL Final   Nitrite, UA 96/94/7974 negative   Final   Leukocytes, UA 05/14/2023 Small (1+) (A)  Negative Final   70 Leu/uL   MICRO NUMBER: 05/14/2023 [id number]   Final   SPECIMEN QUALITY: 05/14/2023 Adequate   Final   Sample Source 05/14/2023 URINE, CLEAN CATCH   Final   STATUS: 05/14/2023 FINAL   Final   Result: 05/14/2023    Final                   Value:Mixed genital flora isolated. These superficial bacteria are not indicative of a urinary tract infection. No further organism identification is warranted on this  specimen. If clinically indicated, recollect clean-catch, mid-stream urine and transfer  immediately to Urine Culture Transport Tube.   Admission on 05/09/2023, Discharged on 05/12/2023  Component Date Value Ref Range Status   Glucose-Capillary 05/09/2023 174 (H)  70 - 99 mg/dL Final   Glucose reference range applies only to samples taken after fasting for at least 8 hours.   Opiates 05/10/2023 NONE DETECTED  NONE DETECTED Final   Cocaine 05/10/2023 NONE DETECTED  NONE DETECTED Final   Benzodiazepines 05/10/2023 NONE DETECTED  NONE DETECTED Final   Amphetamines 05/10/2023 NONE DETECTED  NONE DETECTED Final   Tetrahydrocannabinol 05/10/2023  NONE DETECTED  NONE DETECTED Final   Barbiturates 05/10/2023 NONE DETECTED  NONE DETECTED Final   Comment: (NOTE) DRUG SCREEN FOR MEDICAL PURPOSES ONLY.  IF CONFIRMATION IS NEEDED FOR ANY PURPOSE, NOTIFY LAB WITHIN 5 DAYS.  LOWEST DETECTABLE LIMITS FOR URINE DRUG SCREEN Drug Class                     Cutoff (ng/mL) Amphetamine and metabolites    1000 Barbiturate and metabolites    200 Benzodiazepine                 200 Opiates and metabolites        300 Cocaine and metabolites        300 THC                            50 Performed at [facility], [address] [place], [place] [address]    Creatinine, Ser 05/10/2023 1.68 (H)  0.44 - 1.00 mg/dL Final   GFR, Estimated 05/10/2023 31 (L)  >60 mL/min Final   Comment: (NOTE) Calculated using the CKD-EPI Creatinine Equation (2021) Performed at [facility], [address] [place], [place] [address]   Admission on 05/06/2023, Discharged on 05/07/2023  Component Date Value Ref Range Status   WBC 05/06/2023 9.9  4.0 - 10.5 K/uL Final   RBC 05/06/2023 4.16  3.87 - 5.11 MIL/uL Final   Hemoglobin 05/06/2023 11.9 (L)  12.0 - 15.0 g/dL Final   HCT 97/74/7974 38.1  36.0 - 46.0 % Final   MCV 05/06/2023 91.6  80.0 - 100.0 fL Final   MCH 05/06/2023 28.6  26.0 -  34.0 pg Final   MCHC 05/06/2023 31.2  30.0 - 36.0 g/dL Final   RDW 97/74/7974 13.4  11.5 - 15.5 % Final   Platelets 05/06/2023 275  150 - 400 K/uL Final   nRBC 05/06/2023 0.0  0.0 - 0.2 % Final   Neutrophils Relative % 05/06/2023 59  % Final   Neutro Abs 05/06/2023 5.9  1.7 - 7.7 K/uL Final   Lymphocytes Relative 05/06/2023 28  % Final   Lymphs Abs 05/06/2023 2.7  0.7 - 4.0 K/uL Final   Monocytes Relative 05/06/2023 8  % Final   Monocytes Absolute 05/06/2023 0.8  0.1 - 1.0 K/uL Final   Eosinophils Relative 05/06/2023 4  % Final   Eosinophils Absolute 05/06/2023 0.4  0.0 - 0.5 K/uL Final   Basophils Relative 05/06/2023 1  % Final   Basophils Absolute 05/06/2023 0.1  0.0 - 0.1 K/uL Final   Immature Granulocytes 05/06/2023 0  % Final   Abs Immature Granulocytes 05/06/2023 0.04  0.00 - 0.07 K/uL Final   Performed at [facility], [address] [place], [place] [address]   Sodium 05/06/2023 135  135 - 145 mmol/L Final   Potassium 05/06/2023 3.9  3.5 - 5.1 mmol/L Final   Chloride 05/06/2023 105  98 - 111 mmol/L Final   CO2 05/06/2023 19 (L)  22 - 32 mmol/L Final   Glucose, Bld 05/06/2023 299 (H)  70 - 99 mg/dL Final   Glucose reference range applies only to samples taken after fasting for at least 8 hours.   BUN 05/06/2023 26 (H)  8 - 23 mg/dL Final   Creatinine, Ser 05/06/2023 1.56 (H)  0.44 - 1.00 mg/dL Final   Calcium  05/06/2023 9.1  8.9 - 10.3 mg/dL Final   Total  Protein 05/06/2023 7.4  6.5 - 8.1 g/dL Final   Albumin 97/74/7974 4.0  3.5 - 5.0 g/dL Final   AST 97/74/7974 13 (L)  15 - 41 U/L Final   ALT 05/06/2023 13  0 - 44 U/L Final   Alkaline Phosphatase 05/06/2023 92  38 - 126 U/L Final   Total Bilirubin 05/06/2023 0.7  0.0 - 1.2 mg/dL Final   GFR, Estimated 05/06/2023 33 (L)  >60 mL/min Final   Comment: (NOTE) Calculated using the CKD-EPI Creatinine Equation (2021)    Anion gap 05/06/2023 11  5 - 15 Final   Performed at [facility], [address] [place], [place] [address]   Color, Urine 05/06/2023 YELLOW  YELLOW Final   APPearance 05/06/2023 CLOUDY (A)  CLEAR Final   Specific Gravity, Urine 05/06/2023 1.018  1.005 - 1.030 Final   pH 05/06/2023 5.0  5.0 - 8.0 Final   Glucose, UA 05/06/2023 50 (A)  NEGATIVE mg/dL Final   Hgb urine dipstick 05/06/2023 NEGATIVE  NEGATIVE Final   Bilirubin Urine 05/06/2023 NEGATIVE  NEGATIVE Final   Ketones, ur 05/06/2023 NEGATIVE  NEGATIVE mg/dL Final   Protein, ur 97/74/7974 NEGATIVE  NEGATIVE mg/dL Final   Nitrite 97/74/7974 POSITIVE (A)  NEGATIVE Final   Leukocytes,Ua 05/06/2023 LARGE (A)  NEGATIVE Final   RBC / HPF 05/06/2023 6-10  0 - 5 RBC/hpf Final   WBC, UA 05/06/2023 >50  0 - 5 WBC/hpf Final   Bacteria, UA 05/06/2023 MANY (A)  NONE SEEN Final   Squamous Epithelial / HPF 05/06/2023 0-5  0 - 5 /HPF Final   WBC Clumps 05/06/2023 PRESENT   Final   Mucus 05/06/2023 PRESENT   Final   Hyaline Casts, UA 05/06/2023 PRESENT   Final   Performed at [facility], [address], [place], [place] [address]   Alcohol, Ethyl (B) 05/06/2023 <10  <10 mg/dL Final   Comment: (NOTE) Lowest detectable limit for serum alcohol is 10 mg/dL.  For medical purposes only. Performed at [facility], [address], [place], [place] [address]    Acetaminophen  (Tylenol ), Serum 05/06/2023 <10 (L)  10 - 30 ug/mL Final   Comment: (NOTE) Therapeutic concentrations vary significantly. A range of 10-30 ug/mL  may be an effective concentration for many patients. However, some  are best treated at concentrations outside of this range. Acetaminophen  concentrations >150 ug/mL at 4 hours after ingestion  and >50 ug/mL at 12 hours after ingestion are often associated with  toxic reactions.  Performed at [facility], [address], [place], [place] [address]    Opiates 05/06/2023 NONE DETECTED  NONE DETECTED Final   Cocaine 05/06/2023 NONE DETECTED   NONE DETECTED Final   Benzodiazepines 05/06/2023 NONE DETECTED  NONE DETECTED Final   Amphetamines 05/06/2023 NONE DETECTED  NONE DETECTED Final   Tetrahydrocannabinol 05/06/2023 NONE DETECTED  NONE DETECTED Final   Barbiturates 05/06/2023 NONE DETECTED  NONE DETECTED Final   Comment: (NOTE) DRUG SCREEN FOR MEDICAL PURPOSES ONLY.  IF CONFIRMATION IS NEEDED FOR ANY PURPOSE, NOTIFY LAB WITHIN 5 DAYS.  LOWEST DETECTABLE LIMITS FOR URINE DRUG SCREEN Drug Class                     Cutoff (ng/mL) Amphetamine and metabolites    1000 Barbiturate and metabolites    200 Benzodiazepine                 200 Opiates and metabolites  300 Cocaine and metabolites        300 THC                            50 Performed at [facility], [address], [place], [place] [address]   Admission on 04/28/2023, Discharged on 04/30/2023  Component Date Value Ref Range Status   Sodium 04/28/2023 133 (L)  135 - 145 mmol/L Final   Potassium 04/28/2023 4.2  3.5 - 5.1 mmol/L Final   Chloride 04/28/2023 105  98 - 111 mmol/L Final   CO2 04/28/2023 20 (L)  22 - 32 mmol/L Final   Glucose, Bld 04/28/2023 346 (H)  70 - 99 mg/dL Final   Glucose reference range applies only to samples taken after fasting for at least 8 hours.   BUN 04/28/2023 13  8 - 23 mg/dL Final   Creatinine, Ser 04/28/2023 1.33 (H)  0.44 - 1.00 mg/dL Final   Calcium  04/28/2023 8.8 (L)  8.9 - 10.3 mg/dL Final   Total Protein 97/82/7974 7.5  6.5 - 8.1 g/dL Final   Albumin 97/82/7974 3.8  3.5 - 5.0 g/dL Final   AST 97/82/7974 16  15 - 41 U/L Final   ALT 04/28/2023 13  0 - 44 U/L Final   Alkaline Phosphatase 04/28/2023 79  38 - 126 U/L Final   Total Bilirubin 04/28/2023 0.4  0.0 - 1.2 mg/dL Final   GFR, Estimated 04/28/2023 40 (L)  >60 mL/min Final   Comment: (NOTE) Calculated using the CKD-EPI Creatinine Equation (2021)    Anion gap 04/28/2023 8  5 - 15 Final   Performed at [facility], [address], [place], [place] [address]   Alcohol, Ethyl (B) 04/28/2023 <10  <10 mg/dL Final   Comment: (NOTE) Lowest detectable limit for serum alcohol is 10 mg/dL.  For medical purposes only. Performed at [facility], [address] [place], [place] [address]    Salicylate Lvl 04/28/2023 <7.0 (L)  7.0 - 30.0 mg/dL Final   Performed at [facility], [address] [place], [place] [address]   Acetaminophen  (Tylenol ), Serum 04/28/2023 <10 (L)  10 - 30 ug/mL Final   Comment: (NOTE) Therapeutic concentrations vary significantly. A range of 10-30 ug/mL  may be an effective concentration for many patients. However, some  are best treated at concentrations outside of this range. Acetaminophen  concentrations >150 ug/mL at 4 hours after ingestion  and >50 ug/mL at 12 hours after ingestion are often associated with  toxic reactions.  Performed at [facility], [address], [place], [place] [address]    Opiates 04/29/2023 NONE DETECTED  NONE DETECTED Final   Cocaine 04/29/2023 NONE DETECTED  NONE DETECTED Final   Benzodiazepines 04/29/2023 NONE DETECTED  NONE DETECTED Final   Amphetamines 04/29/2023 NONE DETECTED  NONE DETECTED Final   Tetrahydrocannabinol 04/29/2023 NONE DETECTED  NONE DETECTED Final   Barbiturates 04/29/2023 NONE DETECTED  NONE DETECTED Final   Comment: (NOTE) DRUG SCREEN FOR MEDICAL PURPOSES ONLY.  IF CONFIRMATION IS NEEDED FOR ANY PURPOSE, NOTIFY LAB WITHIN 5 DAYS.  LOWEST DETECTABLE LIMITS FOR URINE DRUG SCREEN Drug Class                     Cutoff (ng/mL) Amphetamine and metabolites    1000 Barbiturate and metabolites    200 Benzodiazepine  200 Opiates and metabolites        300 Cocaine and metabolites        300 THC                            50 Performed at [facility], [address], [place], [place] [address]    WBC 04/28/2023 7.4  4.0 - 10.5 K/uL Final    RBC 04/28/2023 4.06  3.87 - 5.11 MIL/uL Final   Hemoglobin 04/28/2023 11.6 (L)  12.0 - 15.0 g/dL Final   HCT 97/82/7974 36.0  36.0 - 46.0 % Final   MCV 04/28/2023 88.7  80.0 - 100.0 fL Final   MCH 04/28/2023 28.6  26.0 - 34.0 pg Final   MCHC 04/28/2023 32.2  30.0 - 36.0 g/dL Final   RDW 97/82/7974 13.4  11.5 - 15.5 % Final   Platelets 04/28/2023 236  150 - 400 K/uL Final   nRBC 04/28/2023 0.0  0.0 - 0.2 % Final   Neutrophils Relative % 04/28/2023 71  % Final   Neutro Abs 04/28/2023 5.3  1.7 - 7.7 K/uL Final   Lymphocytes Relative 04/28/2023 20  % Final   Lymphs Abs 04/28/2023 1.5  0.7 - 4.0 K/uL Final   Monocytes Relative 04/28/2023 5  % Final   Monocytes Absolute 04/28/2023 0.4  0.1 - 1.0 K/uL Final   Eosinophils Relative 04/28/2023 3  % Final   Eosinophils Absolute 04/28/2023 0.2  0.0 - 0.5 K/uL Final   Basophils Relative 04/28/2023 1  % Final   Basophils Absolute 04/28/2023 0.1  0.0 - 0.1 K/uL Final   Immature Granulocytes 04/28/2023 0  % Final   Abs Immature Granulocytes 04/28/2023 0.02  0.00 - 0.07 K/uL Final   Performed at [facility], [address], [place], [place] [address]   Specimen Source 04/29/2023 URINE, UNSPE   Final   Color, Urine 04/29/2023 YELLOW  YELLOW Final   APPearance 04/29/2023 CLEAR  CLEAR Final   Specific Gravity, Urine 04/29/2023 1.010  1.005 - 1.030 Final   pH 04/29/2023 5.0  5.0 - 8.0 Final   Glucose, UA 04/29/2023 >=500 (A)  NEGATIVE mg/dL Final   Hgb urine dipstick 04/29/2023 NEGATIVE  NEGATIVE Final   Bilirubin Urine 04/29/2023 NEGATIVE  NEGATIVE Final   Ketones, ur 04/29/2023 NEGATIVE  NEGATIVE mg/dL Final   Protein, ur 97/81/7974 NEGATIVE  NEGATIVE mg/dL Final   Nitrite 97/81/7974 NEGATIVE  NEGATIVE Final   Leukocytes,Ua 04/29/2023 MODERATE (A)  NEGATIVE Final   RBC / HPF 04/29/2023 0-5  0 - 5 RBC/hpf Final   WBC, UA 04/29/2023 21-50  0 - 5 WBC/hpf Final   Comment:        Reflex urine culture not performed if WBC <=10, OR if  Squamous epithelial cells >5. If Squamous epithelial cells >5 suggest recollection.    Bacteria, UA 04/29/2023 RARE (A)  NONE SEEN Final   Squamous Epithelial / HPF 04/29/2023 0-5  0 - 5 /HPF Final   Mucus 04/29/2023 PRESENT   Final   Performed at [facility], [address], [place], [place] [address]   SARS Coronavirus 2 by RT PCR 04/29/2023 NEGATIVE  NEGATIVE Final   Comment: (NOTE) SARS-CoV-2 target nucleic acids are NOT DETECTED.  The SARS-CoV-2 RNA is generally detectable in upper and lower respiratory specimens during the acute phase of infection. The lowest concentration of SARS-CoV-2 viral copies this assay can detect is 250 copies /  mL. A negative result does not preclude SARS-CoV-2 infection and should not be used as the sole basis for treatment or other patient management decisions.  A negative result may occur with improper specimen collection / handling, submission of specimen other than nasopharyngeal swab, presence of viral mutation(s) within the areas targeted by this assay, and inadequate number of viral copies (<250 copies / mL). A negative result must be combined with clinical observations, patient history, and epidemiological information.  Fact Sheet for Patients:   RoadLapTop.co.za  Fact Sheet for Healthcare Providers: http://kim-miller.com/  This test is not yet approved or                           cleared by the [place]  FDA and has been authorized for detection and/or diagnosis of SARS-CoV-2 by FDA under an Emergency Use Authorization (EUA).  This EUA will remain in effect (meaning this test can be used) for the duration of the COVID-19 declaration under Section 564(b)(1) of the Act, 21 U.S.C. section 360bbb-3(b)(1), unless the authorization is terminated or revoked sooner.  Performed at [facility], [address], [place], [place] [address]     Glucose-Capillary 04/28/2023 261 (H)  70 - 99 mg/dL Final   Glucose reference range applies only to samples taken after fasting for at least 8 hours.   Specimen Description 04/29/2023    Final                   Value:URINE, RANDOM Performed at [facility], [address] [place], [place] [address]    Special Requests 04/29/2023    Final                   Value:NONE Reflexed from [id number] Performed at [facility], [address] [place], [place] [address]    Culture 04/29/2023 30,000 COLONIES/mL KLEBSIELLA PNEUMONIAE (A)   Final   Report Status 04/29/2023 05/01/2023 FINAL   Final   Organism ID, Bacteria 04/29/2023 KLEBSIELLA PNEUMONIAE (A)   Final   Glucose-Capillary 04/29/2023 171 (H)  70 - 99 mg/dL Final   Glucose reference range applies only to samples taken after fasting for at least 8 hours.   Glucose-Capillary 04/29/2023 159 (H)  70 - 99 mg/dL Final   Glucose reference range applies only to samples taken after fasting for at least 8 hours.  There may be more visits with results that are not included.    Allergies: Bee venom, Mushroom extract complex (obsolete), Penicillins, Codeine, Sulfa  antibiotics, and Iohexol   Medications:  Facility Ordered Medications  Medication   [COMPLETED] sodium chloride  0.9 % bolus 1,000 mL   [COMPLETED] fosfomycin (MONUROL ) packet 3 g   [COMPLETED] lactated ringers  bolus 1,000 mL   [COMPLETED] cefTRIAXone  (ROCEPHIN ) 1 g in sodium chloride  0.9 % 100 mL IVPB   acetaminophen  (TYLENOL ) tablet 650 mg   alum & mag hydroxide-simeth (MAALOX/MYLANTA) 200-200-20 MG/5ML suspension 30 mL   magnesium  hydroxide (MILK OF MAGNESIA) suspension 30 mL   OLANZapine  zydis (ZYPREXA ) disintegrating tablet 5 mg   OLANZapine  (ZYPREXA ) injection 5 mg   cefpodoxime (VANTIN) 100 MG/5ML suspension 100 mg   PTA Medications  Medication Sig   feeding supplement, GLUCERNA SHAKE, (GLUCERNA SHAKE) LIQD Take 237 mLs by mouth 3 (three) times  daily between meals. (Patient taking differently: Take 237 mLs by mouth in the morning and at bedtime.)   albuterol  (PROVENTIL ) (2.5 MG/3ML) 0.083% nebulizer solution Inhale 3 mLs  into the lungs every 6 (six) hours as needed for wheezing or shortness of breath.   hydrocortisone  cream 1 % Apply topically 3 (three) times daily. (Patient taking differently: Apply 1 Application topically 2 (two) times daily as needed for itching.)   latanoprost  (XALATAN ) 0.005 % ophthalmic solution Place 1 drop into both eyes at bedtime.   nitroGLYCERIN  (NITROSTAT ) 0.4 MG SL tablet Place 0.4 mg under the tongue every 5 (five) minutes as needed for chest pain.   dorzolamide -timolol  (COSOPT ) 2-0.5 % ophthalmic solution Place 1 drop into both eyes 2 (two) times daily.   albuterol  (VENTOLIN  HFA) 108 (90 Base) MCG/ACT inhaler Inhale 1-2 puffs into the lungs every 6 (six) hours as needed for wheezing or shortness of breath.   apixaban  (ELIQUIS ) 5 MG TABS tablet Take 1 tablet (5 mg total) by mouth 2 (two) times daily.   clopidogrel  (PLAVIX ) 75 MG tablet Take 1 tablet (75 mg total) by mouth daily.   glipiZIDE  (GLUCOTROL ) 10 MG tablet Take 1 tablet (10 mg total) by mouth 2 (two) times daily. Always take with food   isosorbide  mononitrate (IMDUR ) 30 MG 24 hr tablet Take 0.5 tablets (15 mg total) by mouth daily.   metFORMIN  (GLUCOPHAGE -XR) 500 MG 24 hr tablet Take 1 tablet (500 mg total) by mouth daily with breakfast.   metoprolol  succinate (TOPROL -XL) 25 MG 24 hr tablet Take 1 tablet (25 mg total) by mouth daily.   pantoprazole  (PROTONIX ) 40 MG tablet Take 1 tablet (40 mg total) by mouth daily. (Patient taking differently: Take 40 mg by mouth 2 (two) times daily.)   melatonin 5 MG TABS Take 5 mg by mouth at bedtime as needed (sleep).   acetaminophen  (TYLENOL ) 650 MG CR tablet Take 1,300 mg by mouth every 6 (six) hours as needed for pain.   QUEtiapine  (SEROQUEL ) 50 MG tablet Take 50 mg by mouth at bedtime.   atorvastatin  (LIPITOR)  40 MG tablet TAKE 1 TABLET BY MOUTH EVERY EVENING   DULoxetine  (CYMBALTA ) 60 MG capsule Take 1 capsule (60 mg total) by mouth at bedtime.   DULoxetine  (CYMBALTA ) 30 MG capsule Take 1 capsule (30 mg total) by mouth daily.   ARIPiprazole  (ABILIFY ) 2 MG tablet Take 1 tablet (2 mg total) by mouth daily.      Medical Decision Making  Observation unit    Recommendations  Based on my evaluation the patient does not appear to have an emergency medical condition.  Gaither Pouch, NP 09/02/23  10:29 PM

## 2023-09-02 NOTE — ED Provider Notes (Signed)
 Behavioral Health Urgent Care Medical Screening Exam  Patient Name: Maria Fox MRN: [id number] Date of Evaluation: 09/02/23 Chief Complaint:   Diagnosis:  Final diagnoses:  Severe episode of recurrent major depressive disorder, without psychotic features ([name])  Acute cystitis without hematuria  Confusion  Hyperglycemia  Type 2 diabetes mellitus with stage 3 chronic kidney disease, without long-term current use of insulin , unspecified whether stage 3a or 3b CKD ([name])   History of Present illness: Maria Fox is a 78 y.o. female.   Maria Fox presented to [facility] as a walk in, unaccompanied,  with complaints of suicidal and homicidal ideations.  Maria Fox, 78 y.o., female medical history of CAD, CKD 3, type II uncontrolled diabetes with hyperglycemia, hypertension, major depressive disorder, anxiety, patient seen face to face by this provider, consulted with Dr. Leigh; and chart reviewed on 09/02/23.  On evaluation Maria Fox reports ongoing history of suicidal ideations and depression.  She reports she has been in out of the hospital this year due to worsening depression due to ongoing conflict with her partner of 40 years.  She reports that her partner 40 years kicked her out of the house yesterday and she was feeling unwell and went to the emergency room and was subsequently diagnosed with a UTI.  On further clarification after social work spoke with the patient's brother, patient has been homeless for over a year and living in her car in front of her former significant other's home.  Patient has no family and requires a lot of care there for her brother is unable to take care of her and she has no other family.  Has not been taking her medications consistently due to her limitations related to being homeless.  On review of ED visit it was a long patient's blood sugar was 385.  On recheck here at [facility] blood sugars 391. Patient also has a significant history of multiple falls and  incontinence which often requires adult diapers.  Patient has been hospitalized multiple times this year and unfortunately has not had any assistance in being appropriately placed at any inpatient facility or assisted living facility.  Per chart review patient has been assigned a Child psychotherapist within the health system however it is unclear as to what the status is of placement in permanent housing.  Patient reports that she took a bus here from [employer] and reports that she was told to come here for mental health evaluation.  During evaluation Maria Fox is sitting, shivering draped blanket, in no acute distress.  She is alert/oriented, however, some of her statements are confusing as to her most recent living situation as she reports that she was living at her significant's other's home up until yesterday.  However after social work speaking with her brother patient has actually been homeless for over a year.  Patient is cooperative.  Patient appears chronically ill, thin appearing and appears unhealthy. She is speaking in a clear tone at moderate volume, and normal pace; with good eye contact.  Her thought process is coherent and relevant; There is no indication that she is currently responding to internal/external stimuli or experiencing delusional thought content.  Patient endorses a suicidal thoughts over the last 2 days with a plan to overdose on all of her medications.  Patient endorses that she would like to end the life of her significant other but denies any specific plan and denies having means to do so.  Patient  meets inpatient psychiatric treatment criteria.  Patient has been transported to [facility] emergency department for medical management while awaiting inpatient placement.  Accepting ED provider Dr. Ellouise Dearth Row ED from 09/02/2023 in [facility] ED from 09/01/2023 in [facility] Emergency Department at [facility] ED  to Hosp-Admission (Discharged) from 06/11/2023 in [facility] 5 EAST MEDICAL UNIT  C-SSRS RISK CATEGORY High Risk No Risk No Risk    Psychiatric Specialty Exam  Presentation  General Appearance:Disheveled  Eye Contact:Fair  Speech:Normal Rate  Speech Volume:Normal  Handedness:Right   Mood and Affect  Mood: Anxious; Depressed; Dysphoric  Affect: Flat   Thought Process  Thought Processes: Irrevelant  Descriptions of Associations:Circumstantial  Orientation:Full (Time, Place and Person)  Thought Content:Rumination; Scattered  Diagnosis of Schizophrenia or Schizoaffective disorder in past: No  Duration of Psychotic Symptoms: Less than six months  Hallucinations:None  Ideas of Reference:None  Suicidal Thoughts:Yes, Active With Plan  Homicidal Thoughts:Yes, Passive Without Intent   Sensorium  Memory: Immediate Fair; Recent Poor; Remote Fair  Judgment: Poor  Insight: Present   Executive Functions  Concentration: Poor  Attention Span: Poor  Recall: [employer] of Knowledge: Fair  Language: Fair   Psychomotor Activity  Psychomotor Activity: Normal   Assets  Assets: Manufacturing systems engineer; Desire for Improvement   Sleep  Sleep: Poor  Number of hours:  6   Physical Exam: Physical Exam Constitutional:      General: She is not in acute distress.    Appearance: Normal appearance. She is not ill-appearing.  HENT:     Head: Normocephalic and atraumatic.     Nose: Nose normal.   Eyes:     Extraocular Movements: Extraocular movements intact.     Conjunctiva/sclera: Conjunctivae normal.     Pupils: Pupils are equal, round, and reactive to light.    Cardiovascular:     Rate and Rhythm: Normal rate and regular rhythm.  Pulmonary:     Effort: Pulmonary effort is normal.     Breath sounds: Normal breath sounds.   Musculoskeletal:     Cervical back: Normal range of motion and neck supple.   Skin:    General:  Skin is warm and dry.   Neurological:     General: No focal deficit present.     Mental Status: She is alert and oriented to person, place, and time.     Review of Systems  Constitutional:  Positive for malaise/fatigue.  Respiratory:  Positive for shortness of breath.   Genitourinary:  Positive for dysuria and urgency.  Psychiatric/Behavioral:  Positive for depression, memory loss and suicidal ideas.     Blood pressure (!) 140/80, pulse 98, temperature 98 F (36.7 C), resp. rate 16, SpO2 99%. There is no height or weight on file to calculate BMI.  Musculoskeletal: Strength & Muscle Tone: decreased Gait & Station: unsteady Patient leans: N/A   [facility] MSE Discharge Disposition for Follow up and Recommendations: Based on my evaluation the patient appears to have an emergency medical condition for which I recommend the patient be transferred to the emergency department for further evaluation.   -Patient has been recommended for inpatient psychiatric treatment due to active suicidal ideation and severe depression without psychosis.   Patient also recently receive treatment for a UTI at [facility]  with 3 g of fosfomycin.  She is being transferred to [facility] emergency department for medical evaluation and management while awaiting an inpatient psychiatric bed.  If patient  has not been placed in the inpatient psychiatric facility and subsequently stabilizes in the ED, ED provider police placed a [facility] consult as patient needs assistance with safe placement given that she currently has nowhere to live.   - Patient is hyperglycemic current blood sugars 391   Suzen Lesches, NP 09/02/2023, 9:28 AM

## 2023-09-02 NOTE — ED Provider Triage Note (Signed)
 Emergency Medicine Provider Triage Evaluation Note  Maria Fox , a 78 y.o. female  was evaluated in triage.  Pt complains of thoughts of suicide.  She said she has not taken any of her medications today  Review of Systems  Positive: Urinary symptoms Negative: Fever  Physical Exam  BP (!) 149/81 (BP Location: Right Arm)   Pulse (!) 114   Temp (!) 97.5 F (36.4 C)   Resp 16   Ht 5' 3 (1.6 m)   Wt 59.9 kg   SpO2 100%   BMI 23.38 kg/m  Gen:   Awake, no distress   Resp:  Normal effort  MSK:   Moves extremities without difficulty  Other:    Medical Decision Making  Medically screening exam initiated at 10:58 AM.  Appropriate orders placed.  Maria Fox was informed that the remainder of the evaluation will be completed by another provider, this initial triage assessment does not replace that evaluation, and the importance of remaining in the ED until their evaluation is complete.     Towana Ozell BROCKS, MD 09/02/23 1059

## 2023-09-02 NOTE — Progress Notes (Signed)
 LCSW Progress Note  [id number]   JANYIAH SILVERI  09/02/2023  9:21 AM  Description:   Inpatient Psychiatric Referral  Patient was recommended inpatient per Suzen Lesches NP.  There are no available beds at [facility], per [facility] [name] RN. Patient was referred to the following out of network facilities:   Destination  Service Provider Address Phone Fax  [facility]  [address], [place] [address] [phone]  [facility]  [address] [place], [place] [address] [phone]  [facility]  [address] [place] [address] [phone]  [facility]  [address] [place] [address] [phone]  [facility]  [address] [place] [address] [phone]  [facility] EFAX  [address] [place], [place] [phone]  [facility]  [address] [place] [address] [phone]  [facility]  [address] [place] [address] [phone]       Situation ongoing, CSW to continue following and update chart as more information becomes available.      [place] Kenzlee Fishburn MSW, LCSW  09/02/2023 9:21 AM

## 2023-09-02 NOTE — ED Triage Notes (Signed)
 Patient sent by [facility] for medical evalutation of UTI and hyperglycemia.  Patient went to [facility] with SI and depression symptoms.  Brother reports patient has been homeless for over  ayear and living in her car in front of her previous significant others home.  She has been seen several times here for similar symptoms and yesterday for heat exposure.  [facility] recommends inpatient placement.

## 2023-09-02 NOTE — Discharge Instructions (Signed)
 Thank you for coming to [facility] Emergency Department. You were seen for continued urinary tract infection and high blood sugar. You were given fluids and antibiotics. Please take cefpodoxime 100 mg twice per day for 7 days. Please continue to take your blood sugar medications (glipizide  and metformin ) as usual. Please follow up with your primary care physician within 7 days.  You are being transferred back to behavioral health urgent care for inpatient treatment.  Do not hesitate to return to the ED or call 911 if you experience: -Worsening symptoms -Flank pain, abdominal pain -Nausea/vomiting so severe you cannot eat/drink or take your medications -Lightheadedness, passing out -Fevers/chills -Anything else that concerns you

## 2023-09-03 DIAGNOSIS — Z951 Presence of aortocoronary bypass graft: Secondary | ICD-10-CM | POA: Diagnosis not present

## 2023-09-03 DIAGNOSIS — E1122 Type 2 diabetes mellitus with diabetic chronic kidney disease: Secondary | ICD-10-CM | POA: Diagnosis not present

## 2023-09-03 DIAGNOSIS — Z7984 Long term (current) use of oral hypoglycemic drugs: Secondary | ICD-10-CM | POA: Diagnosis not present

## 2023-09-03 DIAGNOSIS — Z5902 Unsheltered homelessness: Secondary | ICD-10-CM | POA: Diagnosis not present

## 2023-09-03 DIAGNOSIS — R45851 Suicidal ideations: Secondary | ICD-10-CM | POA: Diagnosis not present

## 2023-09-03 DIAGNOSIS — L80 Vitiligo: Secondary | ICD-10-CM | POA: Diagnosis not present

## 2023-09-03 DIAGNOSIS — Z794 Long term (current) use of insulin: Secondary | ICD-10-CM | POA: Diagnosis not present

## 2023-09-03 DIAGNOSIS — N390 Urinary tract infection, site not specified: Secondary | ICD-10-CM | POA: Diagnosis not present

## 2023-09-03 DIAGNOSIS — Z91148 Patient's other noncompliance with medication regimen for other reason: Secondary | ICD-10-CM | POA: Diagnosis not present

## 2023-09-03 DIAGNOSIS — I129 Hypertensive chronic kidney disease with stage 1 through stage 4 chronic kidney disease, or unspecified chronic kidney disease: Secondary | ICD-10-CM | POA: Diagnosis not present

## 2023-09-03 DIAGNOSIS — Z7901 Long term (current) use of anticoagulants: Secondary | ICD-10-CM | POA: Diagnosis not present

## 2023-09-03 DIAGNOSIS — Z7902 Long term (current) use of antithrombotics/antiplatelets: Secondary | ICD-10-CM | POA: Diagnosis not present

## 2023-09-03 DIAGNOSIS — N183 Chronic kidney disease, stage 3 unspecified: Secondary | ICD-10-CM | POA: Diagnosis not present

## 2023-09-03 DIAGNOSIS — R739 Hyperglycemia, unspecified: Secondary | ICD-10-CM

## 2023-09-03 DIAGNOSIS — I2510 Atherosclerotic heart disease of native coronary artery without angina pectoris: Secondary | ICD-10-CM | POA: Diagnosis not present

## 2023-09-03 DIAGNOSIS — F329 Major depressive disorder, single episode, unspecified: Secondary | ICD-10-CM | POA: Diagnosis not present

## 2023-09-03 DIAGNOSIS — I4891 Unspecified atrial fibrillation: Secondary | ICD-10-CM | POA: Diagnosis not present

## 2023-09-03 LAB — GLUCOSE, CAPILLARY
Glucose-Capillary: 336 mg/dL — ABNORMAL HIGH (ref 70–99)
Glucose-Capillary: 321 mg/dL — ABNORMAL HIGH (ref 70–99)
Glucose-Capillary: 113 mg/dL — ABNORMAL HIGH (ref 70–99)
Glucose-Capillary: 145 mg/dL — ABNORMAL HIGH (ref 70–99)

## 2023-09-03 MED ORDER — INSULIN ASPART 100 UNIT/ML IJ SOLN
0.0000 [IU] | Freq: Three times a day (TID) | INTRAMUSCULAR | Status: DC
  Administered 2023-09-03: 7 [IU] via SUBCUTANEOUS
  Administered 2023-09-04 (×2): 5 [IU] via SUBCUTANEOUS
  Administered 2023-09-04 – 2023-09-05 (×2): 1 [IU] via SUBCUTANEOUS
  Administered 2023-09-05: 7 [IU] via SUBCUTANEOUS
  Administered 2023-09-05 – 2023-09-06 (×3): 5 [IU] via SUBCUTANEOUS

## 2023-09-03 MED ORDER — INSULIN ASPART 100 UNIT/ML IJ SOLN: 0.0000 [IU] | Freq: Every day | INTRAMUSCULAR | Status: DC

## 2023-09-03 MED ORDER — TRIAMCINOLONE ACETONIDE 0.025 % EX CREA
TOPICAL_CREAM | Freq: Three times a day (TID) | CUTANEOUS | Status: DC | PRN
  Administered 2023-09-06: 1 via TOPICAL
  Filled 2023-09-03: qty 15

## 2023-09-03 MED ORDER — LOPERAMIDE HCL 2 MG PO CAPS
2.0000 mg | ORAL_CAPSULE | Freq: Once | ORAL | Status: AC
  Administered 2023-09-03: 2 mg via ORAL
  Filled 2023-09-03: qty 1

## 2023-09-03 MED ORDER — METOPROLOL TARTRATE 25 MG PO TABS
12.5000 mg | ORAL_TABLET | Freq: Two times a day (BID) | ORAL | Status: DC
  Administered 2023-09-03 – 2023-09-06 (×6): 12.5 mg via ORAL
  Filled 2023-09-03 (×7): qty 1

## 2023-09-03 MED ORDER — HYDROXYZINE HCL 10 MG PO TABS
10.0000 mg | ORAL_TABLET | Freq: Three times a day (TID) | ORAL | Status: DC | PRN
  Administered 2023-09-03: 10 mg via ORAL
  Filled 2023-09-03: qty 1

## 2023-09-03 NOTE — ED Notes (Signed)
Patient observed resting quietly, eyes closed. Respirations equal and unlabored. Will continue to monitor for safety.  

## 2023-09-03 NOTE — ED Notes (Signed)
 Patient with incontinent episodes of both bowel and bladder this morning.

## 2023-09-03 NOTE — Care Management (Addendum)
 OBS Care Management   Writer made a APS report.  Writer informed the APS worker Maria Fox [phone]) that her significant other Maria Fox has access to her money and is not providing her with housing.    Writer informed the APS worker that the patient reports that she has been living in her car.  Patient reports that she does not know how long she has been living in her car.   Patient reports that her significant other Maria Fox takes care of all of the bills and her money.  Patient reports that she has not been taking her medication as prescribed.    11:41am  APS worker Maria Fox [phone] reports that APS has accepted the case to investigate.  The APS worker reports that she will be coming over to the [facility] to meet with the patient today.

## 2023-09-03 NOTE — ED Notes (Signed)
 Patient observed/assessed in bed/chair resting quietly appearing in no distress and verbalizing no complaints at this time. Will continue to monitor.

## 2023-09-03 NOTE — ED Notes (Signed)
 Pt is calm and cooperative. She denies pain, SI/HI/AVH. She is seen drinking fluids well and eating snack. She is medication compliant and safe on the unit at this time with continuous observation in place.

## 2023-09-03 NOTE — ED Notes (Signed)
 Patient is alert & oriented X3. She denies SI or HI. States she last had these thoughts yesterday. Pt endorses auditory hallucinations of a little man mumbling in her ear. Denies visual hallucinations. Pt states she is anxious and depressed. States I can't stop thinking about somebody. Patient is incontinent of urine (wears a brief) and uses a walker to ambulate. She is calm and cooperative. She denies physical pain or discomfort. Patient amenable to going inpatient if recommended and signed the voluntary consent form. She denies any additional needs at this time. We will continue to monitor for safety.

## 2023-09-03 NOTE — ED Provider Notes (Signed)
 Behavioral Health Progress Note  Date and Time: 09/03/2023 9:15 AM Name: Maria Fox MRN:  [id number]  Subjective: I'm itching  Diagnosis:  Final diagnoses:  Suicidal ideation  Recurrent major depressive disorder, remission status unspecified ([name])  Ineffective coping  Urinary tract infection without hematuria, site unspecified    Total Time spent with patient: 1 hour  09/03/23  Maria Fox 78 year old female with significant medical complex history including type II uncontrolled diabetes, atrial fibrillation, hypertension, CAD, major depressive disorder with psychotic features, cognitive decline, seen today during morning rounds after patient Maria transferred from Maria Fox ED after being recommended for inpatient psychiatric treatment and request were made for patient to be placed under a psychiatric call while awaiting inpatient psychiatric placement at Maria Fox, ED.  It is unclear as to the circumstances related to the patient being transferred back to [facility] around 10:00 pm. On evaluation this morning patient is currently stating that she feels safe and does not have any active suicidal ideations although continues to endorse that she feels depressed.  She is also complaining of excessive itching on bilateral upper extremities.  Reports a history of vitiligo and dermatitis which flares up intermittently.  Patient is incontinent of urine and stool and has had several episodes since being on the unit overnight.  Patient also has uncontrolled diabetes and has not had any recent glucose checks.  Patient endorses that she Maria able to eat breakfast and tolerate her meal this morning.  On a brief analysis of patient's mental status patient reports that this year is 2045, president is Trump and is aware that she is Maria Fox, however later clarified that she is in Maria Fox.  Given patient is homeless and Maria attempting to get some additional information regarding financial resources and patient  told this Clinical research associate that she receives a disability check however patient is unaware of how much the check is for.  She also reports that it goes into a bank account and is unable to recall what bank the check is routed to and endorses that her significant other who has put her out of the house has access to getting the funds out of the account. Spoke with social work Maria Fox and also Child psychotherapist from Maria Fox, ED who is very familiar with patient's current situation given that patient has presented to Maria Fox several times this year with psychiatric and medical complaints and has had prolonged admissions in the ED related to psychiatric conditions and complicated UTIs.    Obs Case Management authored by Maria Fox, social work Clinical research associate made a APS report.  Writer informed the APS worker Maria Fox [phone]) that her significant other Maria Fox has access to her money and is not providing her with housing.     Writer informed the APS worker that the patient reports that she has been living in her car.  Patient reports that she does not know how long she has been living in her car.    Patient reports that her significant other Maria Fox takes care of all of the bills and her money.  Patient reports that she has not been taking her medication as prescribed.      11:41am  APS worker Maria Fox [phone] reports that APS has accepted the case to investigate.  The APS worker reports that she will be coming over to the Maria Fox to meet with the patient today.     Admission/transfer documentation 09/02/2023, authored by this Clinical research associate: History of Present illness:  Maria Fox is a 78 y.o. female.   Maria Fox presented to Maria Fox as a walk in, unaccompanied,  with complaints of suicidal and homicidal ideations.  Maria Fox, 78 y.o., female medical history of CAD, CKD 3, type II uncontrolled diabetes with hyperglycemia, hypertension, major depressive disorder,  anxiety, patient seen face to face by this provider, consulted with Maria Fox; and chart reviewed on 09/02/23.  On evaluation Maria Fox reports ongoing history of suicidal ideations and depression.  She reports she has been in out of the Fox this year due to worsening depression due to ongoing conflict with her partner of 40 years.  She reports that her partner 40 years kicked her out of the house yesterday and she Maria feeling unwell and went to the emergency room and Maria subsequently diagnosed with a UTI.  On further clarification after social work spoke with the patient's brother, patient has been homeless for over a year and living in her car in front of her former significant other's home.  Patient has no family and requires a lot of care there for her brother is unable to take care of her and she has no other family.  Has not been taking her medications consistently due to her limitations related to being homeless.  On review of ED visit it Maria a long patient's blood sugar Maria 385.  On recheck here at Maria Fox blood sugars 391. Patient also has a significant history of multiple falls and incontinence which often requires adult diapers.  Patient has been hospitalized multiple times this year and unfortunately has not had any assistance in being appropriately placed at any inpatient facility or assisted living facility.  Per chart review patient has been assigned a Child psychotherapist within the health system however it is unclear as to what the status is of placement in permanent housing.  Patient reports that she took a bus here from Maria Fox and reports that she Maria told to come here for mental health evaluation.  During evaluation Maria Fox is sitting, shivering draped blanket, in no acute distress.  She is alert/oriented, however, some of her statements are confusing as to her most recent living situation as she reports that she Maria living at her significant's other's home up  until yesterday.  However after social work speaking with her brother patient has actually been homeless for over a year.  Patient is cooperative.  Patient appears chronically ill, thin appearing and appears unhealthy. She is speaking in a clear tone at moderate volume, and normal pace; with good eye contact.  Her thought process is coherent and relevant; There is no indication that she is currently responding to internal/external stimuli or experiencing delusional thought content.  Patient endorses a suicidal thoughts over the last 2 days with a plan to overdose on all of her medications.  Patient endorses that she would like to end the life of her significant other but denies any specific plan and denies having means to do so.  Patient meets inpatient psychiatric treatment criteria.  Patient [facility] been [facility] to [facility] for medical management while awaiting inpatient placement.  Accepting ED provider Dr. Ellouise     Past Psychiatric History:  see HPI Past Medical History: See HPI Family History: None reported this admission  Family Psychiatric  History: none reported this admission  Social History: Homeless,living in car, requires significant assistance with completing ADL  Additional Social History:  Sleep: Fair  Appetite:  Poor  Current Medications:  Current Facility-Administered Medications  Medication Dose Route Frequency Provider Last Rate Last Admin   acetaminophen  (TYLENOL ) tablet 650 mg  650 mg Oral Q6H PRN Trudy Carwin, NP       alum & mag hydroxide-simeth (MAALOX/MYLANTA) 200-200-20 MG/5ML suspension 30 mL  30 mL Oral Q4H PRN Trudy Carwin, NP       apixaban  (ELIQUIS ) tablet 5 mg  5 mg Oral BID Trudy Carwin, NP   5 mg at 09/02/23 2317   ARIPiprazole  (ABILIFY ) tablet 2 mg  2 mg Oral Daily Trudy Carwin, NP       atorvastatin  (LIPITOR) tablet 40 mg  40 mg Oral QPM Trudy Carwin, NP       cefpodoxime (VANTIN) 100  MG/5ML suspension 100 mg  100 mg Oral Q12H Trudy Carwin, NP       dorzolamide -timolol  (COSOPT ) 2-0.5 % ophthalmic solution 1 drop  1 drop Both Eyes BID Trudy Carwin, NP   1 drop at 09/03/23 0028   DULoxetine  (CYMBALTA ) DR capsule 60 mg  60 mg Oral QHS Trudy Carwin, NP   60 mg at 09/02/23 2317   feeding supplement (GLUCERNA SHAKE) (GLUCERNA SHAKE) liquid 237 mL  237 mL Oral TID BM Trudy Carwin, NP       glipiZIDE  (GLUCOTROL ) tablet 10 mg  10 mg Oral BID Trudy Carwin, NP   10 mg at 09/02/23 2317   hydrOXYzine  (ATARAX ) tablet 10 mg  10 mg Oral TID PRN Arloa Suzen RAMAN, NP       isosorbide  mononitrate (IMDUR ) 24 hr tablet 15 mg  15 mg Oral Daily Trudy Carwin, NP       latanoprost  (XALATAN ) 0.005 % ophthalmic solution 1 drop  1 drop Both Eyes QHS Trudy Carwin, NP   1 drop at 09/03/23 0028   magnesium  hydroxide (MILK OF MAGNESIA) suspension 30 mL  30 mL Oral Daily PRN Trudy Carwin, NP       OLANZapine  (ZYPREXA ) injection 5 mg  5 mg Intramuscular TID PRN Trudy Carwin, NP       OLANZapine  zydis (ZYPREXA ) disintegrating tablet 5 mg  5 mg Oral TID PRN Trudy Carwin, NP       triamcinolone  (KENALOG ) 0.025 % cream   Topical TID PRN Arloa Suzen RAMAN, NP       Current Outpatient Medications  Medication Sig Dispense Refill   acetaminophen  (TYLENOL ) 650 MG CR tablet Take 1,300 mg by mouth every 6 (six) hours as needed for pain.     albuterol  (PROVENTIL ) (2.5 MG/3ML) 0.083% nebulizer solution Inhale 3 mLs into the lungs every 6 (six) hours as needed for wheezing or shortness of breath. 75 mL 12   albuterol  (VENTOLIN  HFA) 108 (90 Base) MCG/ACT inhaler Inhale 1-2 puffs into the lungs every 6 (six) hours as needed for wheezing or shortness of breath. 6.7 g 3   apixaban  (ELIQUIS ) 5 MG TABS tablet Take 1 tablet (5 mg total) by mouth 2 (two) times daily. 60 tablet 6   ARIPiprazole  (ABILIFY ) 2 MG tablet Take 1 tablet (2 mg total) by mouth daily. 60 tablet 0   atorvastatin  (LIPITOR) 40 MG tablet TAKE 1  TABLET BY MOUTH EVERY EVENING 90 tablet 0   cefpodoxime (VANTIN) 100 MG tablet Take 1 tablet (100 mg total) by mouth 2 (two) times daily for 7 days. 14 tablet 0   clopidogrel  (PLAVIX ) 75 MG tablet Take 1 tablet (75 mg total) by mouth daily. 90 tablet 4   dorzolamide -timolol  (  COSOPT ) 2-0.5 % ophthalmic solution Place 1 drop into both eyes 2 (two) times daily.     DULoxetine  (CYMBALTA ) 30 MG capsule Take 1 capsule (30 mg total) by mouth daily. 30 capsule 0   DULoxetine  (CYMBALTA ) 60 MG capsule Take 1 capsule (60 mg total) by mouth at bedtime.     feeding supplement, GLUCERNA SHAKE, (GLUCERNA SHAKE) LIQD Take 237 mLs by mouth 3 (three) times daily between meals. (Patient taking differently: Take 237 mLs by mouth in the morning and at bedtime.) 30 mL 0   glipiZIDE  (GLUCOTROL ) 10 MG tablet Take 1 tablet (10 mg total) by mouth 2 (two) times daily. Always take with food 180 tablet 4   hydrocortisone  cream 1 % Apply topically 3 (three) times daily. (Patient taking differently: Apply 1 Application topically 2 (two) times daily as needed for itching.) 30 g 0   isosorbide  mononitrate (IMDUR ) 30 MG 24 hr tablet Take 0.5 tablets (15 mg total) by mouth daily. 45 tablet 4   latanoprost  (XALATAN ) 0.005 % ophthalmic solution Place 1 drop into both eyes at bedtime. 2.5 mL 12   melatonin 5 MG TABS Take 5 mg by mouth at bedtime as needed (sleep).     metFORMIN  (GLUCOPHAGE -XR) 500 MG 24 hr tablet Take 1 tablet (500 mg total) by mouth daily with breakfast. 90 tablet 4   metoprolol  succinate (TOPROL -XL) 25 MG 24 hr tablet Take 1 tablet (25 mg total) by mouth daily. 90 tablet 4   nitroGLYCERIN  (NITROSTAT ) 0.4 MG SL tablet Place 0.4 mg under the tongue every 5 (five) minutes as needed for chest pain.     pantoprazole  (PROTONIX ) 40 MG tablet Take 1 tablet (40 mg total) by mouth daily. (Patient taking differently: Take 40 mg by mouth 2 (two) times daily.) 90 tablet 4   QUEtiapine  (SEROQUEL ) 50 MG tablet Take 50 mg by mouth at  bedtime.      Labs  Lab Results:  Admission on 09/02/2023, Discharged on 09/02/2023  Component Date Value Ref Range Status   Color, Urine 09/02/2023 YELLOW  YELLOW Final   APPearance 09/02/2023 CLOUDY (A)  CLEAR Final   Specific Gravity, Urine 09/02/2023 1.021  1.005 - 1.030 Final   pH 09/02/2023 5.0  5.0 - 8.0 Final   Glucose, UA 09/02/2023 >=500 (A)  NEGATIVE mg/dL Final   Hgb urine dipstick 09/02/2023 NEGATIVE  NEGATIVE Final   Bilirubin Urine 09/02/2023 NEGATIVE  NEGATIVE Final   Ketones, ur 09/02/2023 NEGATIVE  NEGATIVE mg/dL Final   Protein, ur 93/75/7974 NEGATIVE  NEGATIVE mg/dL Final   Nitrite 93/75/7974 NEGATIVE  NEGATIVE Final   Leukocytes,Ua 09/02/2023 MODERATE (A)  NEGATIVE Final   RBC / HPF 09/02/2023 0-5  0 - 5 RBC/hpf Final   WBC, UA 09/02/2023 >50  0 - 5 WBC/hpf Final   Bacteria, UA 09/02/2023 RARE (A)  NONE SEEN Final   Squamous Epithelial / HPF 09/02/2023 0-5  0 - 5 /HPF Final   WBC Clumps 09/02/2023 PRESENT   Final   Mucus 09/02/2023 PRESENT   Final   Performed at [facility], [address] [place], [place] [address]   Glucose-Capillary 09/02/2023 343 (H)  70 - 99 mg/dL Final   Glucose reference range applies only to samples taken after fasting for at least 8 hours.   WBC 09/02/2023 9.6  4.0 - 10.5 K/uL Final   RBC 09/02/2023 4.13  3.87 - 5.11 MIL/uL Final   Hemoglobin 09/02/2023 12.0  12.0 - 15.0 g/dL Final   HCT  09/02/2023 36.6  36.0 - 46.0 % Final   MCV 09/02/2023 88.6  80.0 - 100.0 fL Final   MCH 09/02/2023 29.1  26.0 - 34.0 pg Final   MCHC 09/02/2023 32.8  30.0 - 36.0 g/dL Final   RDW 93/75/7974 14.2  11.5 - 15.5 % Final   Platelets 09/02/2023 269  150 - 400 K/uL Final   nRBC 09/02/2023 0.0  0.0 - 0.2 % Final   Neutrophils Relative % 09/02/2023 69  % Final   Neutro Abs 09/02/2023 6.7  1.7 - 7.7 K/uL Final   Lymphocytes Relative 09/02/2023 22  % Final   Lymphs Abs 09/02/2023 2.1  0.7 - 4.0 K/uL Final   Monocytes Relative 09/02/2023 6  % Final    Monocytes Absolute 09/02/2023 0.5  0.1 - 1.0 K/uL Final   Eosinophils Relative 09/02/2023 2  % Final   Eosinophils Absolute 09/02/2023 0.2  0.0 - 0.5 K/uL Final   Basophils Relative 09/02/2023 1  % Final   Basophils Absolute 09/02/2023 0.1  0.0 - 0.1 K/uL Final   Immature Granulocytes 09/02/2023 0  % Final   Abs Immature Granulocytes 09/02/2023 0.04  0.00 - 0.07 K/uL Final   Performed at [facility], [address] [place], [place] [address]   Opiates 09/02/2023 NONE DETECTED  NONE DETECTED Final   Cocaine 09/02/2023 NONE DETECTED  NONE DETECTED Final   Benzodiazepines 09/02/2023 NONE DETECTED  NONE DETECTED Final   Amphetamines 09/02/2023 NONE DETECTED  NONE DETECTED Final   Tetrahydrocannabinol 09/02/2023 NONE DETECTED  NONE DETECTED Final   Barbiturates 09/02/2023 NONE DETECTED  NONE DETECTED Final   Comment: (NOTE) DRUG SCREEN FOR MEDICAL PURPOSES ONLY.  IF CONFIRMATION IS NEEDED FOR ANY PURPOSE, NOTIFY LAB WITHIN 5 DAYS.  LOWEST DETECTABLE LIMITS FOR URINE DRUG SCREEN Drug Class                     Cutoff (ng/mL) Amphetamine and metabolites    1000 Barbiturate and metabolites    200 Benzodiazepine                 200 Opiates and metabolites        300 Cocaine and metabolites        300 THC                            50 Performed at [facility], [address] [place], [place] [address]    Sodium 09/02/2023 134 (L)  135 - 145 mmol/L Final   Potassium 09/02/2023 3.9  3.5 - 5.1 mmol/L Final   Chloride 09/02/2023 105  98 - 111 mmol/L Final   CO2 09/02/2023 17 (L)  22 - 32 mmol/L Final   Glucose, Bld 09/02/2023 352 (H)  70 - 99 mg/dL Final   Glucose reference range applies only to samples taken after fasting for at least 8 hours.   BUN 09/02/2023 20  8 - 23 mg/dL Final   Creatinine, Ser 09/02/2023 1.75 (H)  0.44 - 1.00 mg/dL Final   Calcium  09/02/2023 9.2  8.9 - 10.3 mg/dL Final   Total Protein 93/75/7974 7.1  6.5 - 8.1 g/dL Final   Albumin 93/75/7974 3.7   3.5 - 5.0 g/dL Final   AST 93/75/7974 20  15 - 41 U/L Final   ALT 09/02/2023 12  0 - 44 U/L Final   Alkaline Phosphatase 09/02/2023 70  38 - 126 U/L Final  Total Bilirubin 09/02/2023 0.7  0.0 - 1.2 mg/dL Final   GFR, Estimated 09/02/2023 29 (L)  >60 mL/min Final   Comment: (NOTE) Calculated using the CKD-EPI Creatinine Equation (2021)    Anion gap 09/02/2023 12  5 - 15 Final   Performed at [facility], [address], [place], [place] [address]   Acetaminophen  (Tylenol ), Serum 09/02/2023 <10 (L)  10 - 30 ug/mL Final   Comment: (NOTE) Therapeutic concentrations vary significantly. A range of 10-30 ug/mL  may be an effective concentration for many patients. However, some  are best treated at concentrations outside of this range. Acetaminophen  concentrations >150 ug/mL at 4 hours after ingestion  and >50 ug/mL at 12 hours after ingestion are often associated with  toxic reactions.  Performed at [facility], [address] [place], [place] [address]    Salicylate Lvl 09/02/2023 <7.0 (L)  7.0 - 30.0 mg/dL Final   Performed at [facility], [address], [place], [place] [address]   Alcohol, Ethyl (B) 09/02/2023 <15  <15 mg/dL Final   Comment: (NOTE) For medical purposes only. Performed at [facility], [address], [place], [place] [address]    Glucose-Capillary 09/02/2023 240 (H)  70 - 99 mg/dL Final   Glucose reference range applies only to samples taken after fasting for at least 8 hours.   Glucose-Capillary 09/02/2023 207 (H)  70 - 99 mg/dL Final   Glucose reference range applies only to samples taken after fasting for at least 8 hours.  Admission on 09/02/2023, Discharged on 09/02/2023  Component Date Value Ref Range Status   Glucose-Capillary 09/02/2023 391 (H)  70 - 99 mg/dL Final   Glucose reference range applies only to samples taken after fasting for at least 8 hours.  Admission on 09/01/2023, Discharged on 09/02/2023  Component Date Value  Ref Range Status   Glucose-Capillary 09/01/2023 385 (H)  70 - 99 mg/dL Final   Glucose reference range applies only to samples taken after fasting for at least 8 hours.   Sodium 09/01/2023 128 (L)  135 - 145 mmol/L Final   Potassium 09/01/2023 4.0  3.5 - 5.1 mmol/L Final   Chloride 09/01/2023 95 (L)  98 - 111 mmol/L Final   CO2 09/01/2023 16 (L)  22 - 32 mmol/L Final   Glucose, Bld 09/01/2023 351 (H)  70 - 99 mg/dL Final   Glucose reference range applies only to samples taken after fasting for at least 8 hours.   BUN 09/01/2023 20  8 - 23 mg/dL Final   Creatinine, Ser 09/01/2023 1.78 (H)  0.44 - 1.00 mg/dL Final   Calcium  09/01/2023 9.3  8.9 - 10.3 mg/dL Final   Total Protein 93/76/7974 7.3  6.5 - 8.1 g/dL Final   Albumin 93/76/7974 4.1  3.5 - 5.0 g/dL Final   AST 93/76/7974 20  15 - 41 U/L Final   ALT 09/01/2023 12  0 - 44 U/L Final   Alkaline Phosphatase 09/01/2023 82  38 - 126 U/L Final   Total Bilirubin 09/01/2023 0.9  0.0 - 1.2 mg/dL Final   GFR, Estimated 09/01/2023 29 (L)  >60 mL/min Final   Comment: (NOTE) Calculated using the CKD-EPI Creatinine Equation (2021)    Anion gap 09/01/2023 17 (H)  5 - 15 Final   Performed at [facility], [address], [place], [place] [address]   Color, Urine 09/01/2023 YELLOW  YELLOW Final   APPearance 09/01/2023 CLOUDY (A)  CLEAR Final   Specific Gravity, Urine 09/01/2023 1.005  1.005 - 1.030 Final   pH 09/01/2023 5.0  5.0 - 8.0 Final   Glucose, UA 09/01/2023 150 (A)  NEGATIVE mg/dL Final   Hgb urine dipstick 09/01/2023 NEGATIVE  NEGATIVE Final   Bilirubin Urine 09/01/2023 NEGATIVE  NEGATIVE Final   Ketones, ur 09/01/2023 NEGATIVE  NEGATIVE mg/dL Final   Protein, ur 93/76/7974 NEGATIVE  NEGATIVE mg/dL Final   Nitrite 93/76/7974 NEGATIVE  NEGATIVE Final   Leukocytes,Ua 09/01/2023 LARGE (A)  NEGATIVE Final   RBC / HPF 09/01/2023 6-10  0 - 5 RBC/hpf Final   WBC, UA 09/01/2023 >50  0 - 5 WBC/hpf Final   Bacteria, UA  09/01/2023 MANY (A)  NONE SEEN Final   Squamous Epithelial / HPF 09/01/2023 0-5  0 - 5 /HPF Final   WBC Clumps 09/01/2023 PRESENT   Final   Mucus 09/01/2023 PRESENT   Final   Performed at [facility], [address], [place], [place] [address]   WBC 09/01/2023 7.9  4.0 - 10.5 K/uL Final   RBC 09/01/2023 4.34  3.87 - 5.11 MIL/uL Final   Hemoglobin 09/01/2023 12.6  12.0 - 15.0 g/dL Final   HCT 93/76/7974 38.9  36.0 - 46.0 % Final   MCV 09/01/2023 89.6  80.0 - 100.0 fL Final   MCH 09/01/2023 29.0  26.0 - 34.0 pg Final   MCHC 09/01/2023 32.4  30.0 - 36.0 g/dL Final   RDW 93/76/7974 14.1  11.5 - 15.5 % Final   Platelets 09/01/2023 268  150 - 400 K/uL Final   nRBC 09/01/2023 0.0  0.0 - 0.2 % Final   Neutrophils Relative % 09/01/2023 62  % Final   Neutro Abs 09/01/2023 4.9  1.7 - 7.7 K/uL Final   Lymphocytes Relative 09/01/2023 27  % Final   Lymphs Abs 09/01/2023 2.1  0.7 - 4.0 K/uL Final   Monocytes Relative 09/01/2023 8  % Final   Monocytes Absolute 09/01/2023 0.6  0.1 - 1.0 K/uL Final   Eosinophils Relative 09/01/2023 2  % Final   Eosinophils Absolute 09/01/2023 0.2  0.0 - 0.5 K/uL Final   Basophils Relative 09/01/2023 1  % Final   Basophils Absolute 09/01/2023 0.1  0.0 - 0.1 K/uL Final   Immature Granulocytes 09/01/2023 0  % Final   Abs Immature Granulocytes 09/01/2023 0.03  0.00 - 0.07 K/uL Final   Performed at [facility], [address], [place], [place] [address]   Troponin I (High Sensitivity) 09/01/2023 16  <18 ng/L Final   Comment: (NOTE) Elevated high sensitivity troponin I (hsTnI) values and significant  changes across serial measurements may suggest ACS but many other  chronic and acute conditions are known to elevate hsTnI results.  Refer to the Links section for chest pain algorithms and additional  guidance. Performed at [facility], [address], [place], [place] [address]    Total CK 09/01/2023 63  38 -  234 U/L Final   Performed at [facility], [address] [place], [place] [address]   Troponin I (High Sensitivity) 09/01/2023 17  <18 ng/L Final   Comment: (NOTE) Elevated high sensitivity troponin I (hsTnI) values and significant  changes across serial measurements may suggest ACS but many other  chronic and acute conditions are known to elevate hsTnI results.  Refer to the Links section for chest pain algorithms and additional  guidance. Performed at [facility], [address]  [place], [place] [address]    Specimen Description 09/01/2023    Final                   Value:URINE, CLEAN CATCH Performed at [facility], [address], [place], [place] [address]    Special Requests 09/01/2023    Final                   Value:NONE Performed at [facility], [address] [place], [place] [address]    Culture 09/01/2023 >=100,000 COLONIES/mL GRAM NEGATIVE RODS (A)   Final   Report Status 09/01/2023 PENDING   Incomplete  No results displayed because visit has over 200 results.    Admission on 05/28/2023, Discharged on 06/06/2023  Component Date Value Ref Range Status   Sodium 05/28/2023 134 (L)  135 - 145 mmol/L Final   Potassium 05/28/2023 3.6  3.5 - 5.1 mmol/L Final   Chloride 05/28/2023 107  98 - 111 mmol/L Final   CO2 05/28/2023 18 (L)  22 - 32 mmol/L Final   Glucose, Bld 05/28/2023 294 (H)  70 - 99 mg/dL Final   Glucose reference range applies only to samples taken after fasting for at least 8 hours.   BUN 05/28/2023 21  8 - 23 mg/dL Final   Creatinine, Ser 05/28/2023 1.50 (H)  0.44 - 1.00 mg/dL Final   Calcium  05/28/2023 8.6 (L)  8.9 - 10.3 mg/dL Final   Total Protein 96/80/7974 7.1  6.5 - 8.1 g/dL Final   Albumin 96/80/7974 3.7  3.5 - 5.0 g/dL Final   AST 96/80/7974 15  15 - 41 U/L Final   ALT 05/28/2023 12  0 - 44 U/L Final   Alkaline Phosphatase 05/28/2023 71  38 - 126 U/L Final   Total  Bilirubin 05/28/2023 0.9  0.0 - 1.2 mg/dL Final   GFR, Estimated 05/28/2023 35 (L)  >60 mL/min Final   Comment: (NOTE) Calculated using the CKD-EPI Creatinine Equation (2021)    Anion gap 05/28/2023 9  5 - 15 Final   Performed at [facility], [address], [place], [place] [address]   Alcohol, Ethyl (B) 05/28/2023 <10  <10 mg/dL Final   Comment: (NOTE) Lowest detectable limit for serum alcohol is 10 mg/dL.  For medical purposes only. Performed at [facility], [address] [address], [place], [place] [address]    Salicylate Lvl 05/28/2023 <7.0 (L)  7.0 - 30.0 mg/dL Final   Performed at [facility], [address], [place], [place] [address]   Acetaminophen  (Tylenol ), Serum 05/28/2023 <10 (L)  10 - 30 ug/mL Final   Comment: (NOTE) Therapeutic concentrations vary significantly. A range of 10-30 ug/mL  may be an effective concentration for many patients. However, some  are best treated at concentrations outside of this range. Acetaminophen  concentrations >150 ug/mL at 4 hours after ingestion  and >50 ug/mL at 12 hours after ingestion are often associated with  toxic reactions.  Performed at [facility], [address] [place], [place] [address]    WBC 05/28/2023 8.8  4.0 - 10.5 K/uL Final   RBC 05/28/2023 4.06  3.87 - 5.11 MIL/uL Final   Hemoglobin 05/28/2023 11.6 (L)  12.0 - 15.0 g/dL Final   HCT 96/80/7974 36.3  36.0 - 46.0 % Final   MCV 05/28/2023 89.4  80.0 - 100.0 fL Final   MCH 05/28/2023 28.6  26.0 - 34.0 pg Final   MCHC 05/28/2023  32.0  30.0 - 36.0 g/dL Final   RDW 96/80/7974 14.1  11.5 - 15.5 % Final   Platelets 05/28/2023 233  150 - 400 K/uL Final   nRBC 05/28/2023 0.0  0.0 - 0.2 % Final   Performed at [facility], [address], [place], [place] [address]   Opiates 05/28/2023 NONE DETECTED  NONE DETECTED Final   Cocaine 05/28/2023 NONE DETECTED  NONE DETECTED Final    Benzodiazepines 05/28/2023 NONE DETECTED  NONE DETECTED Final   Amphetamines 05/28/2023 NONE DETECTED  NONE DETECTED Final   Tetrahydrocannabinol 05/28/2023 NONE DETECTED  NONE DETECTED Final   Barbiturates 05/28/2023 NONE DETECTED  NONE DETECTED Final   Comment: (NOTE) DRUG SCREEN FOR MEDICAL PURPOSES ONLY.  IF CONFIRMATION IS NEEDED FOR ANY PURPOSE, NOTIFY LAB WITHIN 5 DAYS.  LOWEST DETECTABLE LIMITS FOR URINE DRUG SCREEN Drug Class                     Cutoff (ng/mL) Amphetamine and metabolites    1000 Barbiturate and metabolites    200 Benzodiazepine                 200 Opiates and metabolites        300 Cocaine and metabolites        300 THC                            50 Performed at [facility], [address] [place], [place] [address]    Color, Urine 05/28/2023 YELLOW  YELLOW Final   APPearance 05/28/2023 CLEAR  CLEAR Final   Specific Gravity, Urine 05/28/2023 1.015  1.005 - 1.030 Final   pH 05/28/2023 5.0  5.0 - 8.0 Final   Glucose, UA 05/28/2023 NEGATIVE  NEGATIVE mg/dL Final   Hgb urine dipstick 05/28/2023 NEGATIVE  NEGATIVE Final   Bilirubin Urine 05/28/2023 NEGATIVE  NEGATIVE Final   Ketones, ur 05/28/2023 NEGATIVE  NEGATIVE mg/dL Final   Protein, ur 96/80/7974 NEGATIVE  NEGATIVE mg/dL Final   Nitrite 96/80/7974 NEGATIVE  NEGATIVE Final   Leukocytes,Ua 05/28/2023 MODERATE (A)  NEGATIVE Final   RBC / HPF 05/28/2023 0-5  0 - 5 RBC/hpf Final   WBC, UA 05/28/2023 11-20  0 - 5 WBC/hpf Final   Bacteria, UA 05/28/2023 RARE (A)  NONE SEEN Final   Squamous Epithelial / HPF 05/28/2023 0-5  0 - 5 /HPF Final   Mucus 05/28/2023 PRESENT   Final   Hyaline Casts, UA 05/28/2023 PRESENT   Final   Performed at [facility], [address], [place], [place] [address]   Glucose-Capillary 05/28/2023 183 (H)  70 - 99 mg/dL Final   Glucose reference range applies only to samples taken after fasting for at least 8 hours.   Glucose-Capillary  05/29/2023 159 (H)  70 - 99 mg/dL Final   Glucose reference range applies only to samples taken after fasting for at least 8 hours.   Glucose-Capillary 05/31/2023 190 (H)  70 - 99 mg/dL Final   Glucose reference range applies only to samples taken after fasting for at least 8 hours.   Comment 1 05/31/2023 Notify RN   Final   Glucose-Capillary 05/31/2023 255 (H)  70 - 99 mg/dL Final   Glucose reference range applies only to samples taken after fasting for at least 8 hours.   Comment 1 05/31/2023 Notify RN   Final   Glucose-Capillary 05/31/2023 187 (H)  70 - 99 mg/dL Final   Glucose reference range applies only to samples taken after fasting for at least 8 hours.   Comment 1 05/31/2023 Notify RN   Final  Admission on 05/15/2023, Discharged on 05/23/2023  Component Date Value Ref Range Status   Sodium 05/15/2023 135  135 - 145 mmol/L Final   Potassium 05/15/2023 4.1  3.5 - 5.1 mmol/L Final   Chloride 05/15/2023 105  98 - 111 mmol/L Final   CO2 05/15/2023 19 (L)  22 - 32 mmol/L Final   Glucose, Bld 05/15/2023 295 (H)  70 - 99 mg/dL Final   Glucose reference range applies only to samples taken after fasting for at least 8 hours.   BUN 05/15/2023 17  8 - 23 mg/dL Final   Creatinine, Ser 05/15/2023 1.25 (H)  0.44 - 1.00 mg/dL Final   Calcium  05/15/2023 9.4  8.9 - 10.3 mg/dL Final   Total Protein 96/93/7974 7.7  6.5 - 8.1 g/dL Final   Albumin 96/93/7974 4.1  3.5 - 5.0 g/dL Final   AST 96/93/7974 19  15 - 41 U/L Final   ALT 05/15/2023 15  0 - 44 U/L Final   Alkaline Phosphatase 05/15/2023 85  38 - 126 U/L Final   Total Bilirubin 05/15/2023 0.7  0.0 - 1.2 mg/dL Final   GFR, Estimated 05/15/2023 44 (L)  >60 mL/min Final   Comment: (NOTE) Calculated using the CKD-EPI Creatinine Equation (2021)    Anion gap 05/15/2023 11  5 - 15 Final   Performed at [facility], [address] [place], [place] [address]   Alcohol, Ethyl (B) 05/15/2023 <10  <10 mg/dL Final   Comment:  (NOTE) Lowest detectable limit for serum alcohol is 10 mg/dL.  For medical purposes only. Performed at [facility], [address] [place], [place] [address]    Salicylate Lvl 05/15/2023 <7.0 (L)  7.0 - 30.0 mg/dL Final   Performed at [facility], [address] [place], [place] [address]   Acetaminophen  (Tylenol ), Serum 05/15/2023 <10 (L)  10 - 30 ug/mL Final   Comment: (NOTE) Therapeutic concentrations vary significantly. A range of 10-30 ug/mL  may be an effective concentration for many patients. However, some  are best treated at concentrations outside of this range. Acetaminophen  concentrations >150 ug/mL at 4 hours after ingestion  and >50 ug/mL at 12 hours after ingestion are often associated with  toxic reactions.  Performed at [facility], [address], [place], [place] [address]    WBC 05/15/2023 8.6  4.0 - 10.5 K/uL Final   RBC 05/15/2023 4.27  3.87 - 5.11 MIL/uL Final   Hemoglobin 05/15/2023 12.3  12.0 - 15.0 g/dL Final   HCT 96/93/7974 37.4  36.0 - 46.0 % Final   MCV 05/15/2023 87.6  80.0 - 100.0 fL Final   MCH 05/15/2023 28.8  26.0 - 34.0 pg Final   MCHC 05/15/2023 32.9  30.0 - 36.0 g/dL Final   RDW 96/93/7974 13.6  11.5 - 15.5 % Final   Platelets 05/15/2023 290  150 - 400 K/uL Final   nRBC 05/15/2023 0.0  0.0 - 0.2 % Final   Performed at [facility], [address] [place], [place] [address]   Opiates 05/15/2023 NONE DETECTED  NONE DETECTED Final   Cocaine 05/15/2023 NONE DETECTED  NONE DETECTED Final   Benzodiazepines 05/15/2023 NONE DETECTED  NONE DETECTED Final   Amphetamines 05/15/2023 NONE DETECTED  NONE DETECTED Final  Tetrahydrocannabinol 05/15/2023 NONE DETECTED  NONE DETECTED Final   Barbiturates 05/15/2023 NONE DETECTED  NONE DETECTED Final   Comment: (NOTE) DRUG SCREEN FOR MEDICAL PURPOSES ONLY.  IF CONFIRMATION IS NEEDED FOR ANY PURPOSE, NOTIFY LAB WITHIN 5  DAYS.  LOWEST DETECTABLE LIMITS FOR URINE DRUG SCREEN Drug Class                     Cutoff (ng/mL) Amphetamine and metabolites    1000 Barbiturate and metabolites    200 Benzodiazepine                 200 Opiates and metabolites        300 Cocaine and metabolites        300 THC                            50 Performed at [facility], [address], [place], [place] [address]    Glucose-Capillary 05/15/2023 309 (H)  70 - 99 mg/dL Final   Glucose reference range applies only to samples taken after fasting for at least 8 hours.   Glucose-Capillary 05/16/2023 282 (H)  70 - 99 mg/dL Final   Glucose reference range applies only to samples taken after fasting for at least 8 hours.   Comment 1 05/16/2023 Notify RN   Final   Glucose-Capillary 05/16/2023 345 (H)  70 - 99 mg/dL Final   Glucose reference range applies only to samples taken after fasting for at least 8 hours.   Comment 1 05/16/2023 Notify RN   Final   Glucose-Capillary 05/16/2023 271 (H)  70 - 99 mg/dL Final   Glucose reference range applies only to samples taken after fasting for at least 8 hours.   Comment 1 05/16/2023 Notify RN   Final   Glucose-Capillary 05/17/2023 111 (H)  70 - 99 mg/dL Final   Glucose reference range applies only to samples taken after fasting for at least 8 hours.   Glucose-Capillary 05/17/2023 252 (H)  70 - 99 mg/dL Final   Glucose reference range applies only to samples taken after fasting for at least 8 hours.   Glucose-Capillary 05/17/2023 246 (H)  70 - 99 mg/dL Final   Glucose reference range applies only to samples taken after fasting for at least 8 hours.   Color, Urine 05/17/2023 YELLOW  YELLOW Final   APPearance 05/17/2023 CLOUDY (A)  CLEAR Final   Specific Gravity, Urine 05/17/2023 1.016  1.005 - 1.030 Final   pH 05/17/2023 5.0  5.0 - 8.0 Final   Glucose, UA 05/17/2023 NEGATIVE  NEGATIVE mg/dL Final   Hgb urine dipstick 05/17/2023 NEGATIVE  NEGATIVE Final   Bilirubin  Urine 05/17/2023 NEGATIVE  NEGATIVE Final   Ketones, ur 05/17/2023 NEGATIVE  NEGATIVE mg/dL Final   Protein, ur 96/91/7974 30 (A)  NEGATIVE mg/dL Final   Nitrite 96/91/7974 NEGATIVE  NEGATIVE Final   Leukocytes,Ua 05/17/2023 LARGE (A)  NEGATIVE Final   RBC / HPF 05/17/2023 21-50  0 - 5 RBC/hpf Final   WBC, UA 05/17/2023 >50  0 - 5 WBC/hpf Final   Bacteria, UA 05/17/2023 MANY (A)  NONE SEEN Final   Squamous Epithelial / HPF 05/17/2023 0-5  0 - 5 /HPF Final   WBC Clumps 05/17/2023 PRESENT   Final   Performed at [facility], [address], [place], [place] [address]   Specimen Description 05/17/2023    Final  Value:URINE, CLEAN CATCH Performed at [facility], [address] [place], [place] [address]    Special Requests 05/17/2023    Final                   Value:NONE Performed at [facility], [address], [place], [place] [address]    Culture 05/17/2023 >=100,000 COLONIES/mL CITROBACTER FREUNDII (A)   Final   Report Status 05/17/2023 05/19/2023 FINAL   Final   Organism ID, Bacteria 05/17/2023 CITROBACTER FREUNDII (A)   Final   Glucose-Capillary 05/17/2023 94  70 - 99 mg/dL Final   Glucose reference range applies only to samples taken after fasting for at least 8 hours.   Glucose-Capillary 05/18/2023 105 (H)  70 - 99 mg/dL Final   Glucose reference range applies only to samples taken after fasting for at least 8 hours.   Glucose-Capillary 05/18/2023 153 (H)  70 - 99 mg/dL Final   Glucose reference range applies only to samples taken after fasting for at least 8 hours.   Glucose-Capillary 05/19/2023 65 (L)  70 - 99 mg/dL Final   Glucose reference range applies only to samples taken after fasting for at least 8 hours.   Glucose-Capillary 05/19/2023 100 (H)  70 - 99 mg/dL Final   Glucose reference range applies only to samples taken after fasting for at least 8 hours.   Glucose-Capillary 05/19/2023 145 (H)  70  - 99 mg/dL Final   Glucose reference range applies only to samples taken after fasting for at least 8 hours.   Comment 1 05/19/2023 Notify RN   Final   Glucose-Capillary 05/19/2023 214 (H)  70 - 99 mg/dL Final   Glucose reference range applies only to samples taken after fasting for at least 8 hours.   Comment 1 05/19/2023 Notify RN   Final   Glucose-Capillary 05/19/2023 158 (H)  70 - 99 mg/dL Final   Glucose reference range applies only to samples taken after fasting for at least 8 hours.   Glucose-Capillary 05/20/2023 185 (H)  70 - 99 mg/dL Final   Glucose reference range applies only to samples taken after fasting for at least 8 hours.   Glucose-Capillary 05/20/2023 144 (H)  70 - 99 mg/dL Final   Glucose reference range applies only to samples taken after fasting for at least 8 hours.   Glucose-Capillary 05/21/2023 228 (H)  70 - 99 mg/dL Final   Glucose reference range applies only to samples taken after fasting for at least 8 hours.   Comment 1 05/21/2023 Notify RN   Final   Glucose-Capillary 05/22/2023 166 (H)  70 - 99 mg/dL Final   Glucose reference range applies only to samples taken after fasting for at least 8 hours.   Comment 1 05/22/2023 Notify RN   Final   Glucose-Capillary 05/22/2023 166 (H)  70 - 99 mg/dL Final   Glucose reference range applies only to samples taken after fasting for at least 8 hours.   Comment 1 05/22/2023 Notify RN   Final   Glucose-Capillary 05/22/2023 186 (H)  70 - 99 mg/dL Final   Glucose reference range applies only to samples taken after fasting for at least 8 hours.  Office Visit on 05/14/2023  Component Date Value Ref Range Status   Color, UA 05/14/2023 yellow   Final   Clarity, UA 05/14/2023 cloudy   Final   Glucose, UA 05/14/2023 Negative  Negative Final   Bilirubin, UA 05/14/2023 negative   Final   Ketones,  UA 05/14/2023 negative   Final   Spec Grav, UA 05/14/2023 1.020  1.010 - 1.025 Final   Blood, UA 05/14/2023 negative   Final   pH, UA  05/14/2023 5.5  5.0 - 8.0 Final   Protein, UA 05/14/2023 Negative  Negative Final   Urobilinogen, UA 05/14/2023 0.2  0.2 or 1.0 E.U./dL Final   Nitrite, UA 96/94/7974 negative   Final   Leukocytes, UA 05/14/2023 Small (1+) (A)  Negative Final   70 Leu/uL   MICRO NUMBER: 05/14/2023 [id number]   Final   SPECIMEN QUALITY: 05/14/2023 Adequate   Final   Sample Source 05/14/2023 URINE, CLEAN CATCH   Final   STATUS: 05/14/2023 FINAL   Final   Result: 05/14/2023    Final                   Value:Mixed genital flora isolated. These superficial bacteria are not indicative of a urinary tract infection. No further organism identification is warranted on this specimen. If clinically indicated, recollect clean-catch, mid-stream urine and transfer  immediately to Urine Culture Transport Tube.   Admission on 05/09/2023, Discharged on 05/12/2023  Component Date Value Ref Range Status   Glucose-Capillary 05/09/2023 174 (H)  70 - 99 mg/dL Final   Glucose reference range applies only to samples taken after fasting for at least 8 hours.   Opiates 05/10/2023 NONE DETECTED  NONE DETECTED Final   Cocaine 05/10/2023 NONE DETECTED  NONE DETECTED Final   Benzodiazepines 05/10/2023 NONE DETECTED  NONE DETECTED Final   Amphetamines 05/10/2023 NONE DETECTED  NONE DETECTED Final   Tetrahydrocannabinol 05/10/2023 NONE DETECTED  NONE DETECTED Final   Barbiturates 05/10/2023 NONE DETECTED  NONE DETECTED Final   Comment: (NOTE) DRUG SCREEN FOR MEDICAL PURPOSES ONLY.  IF CONFIRMATION IS NEEDED FOR ANY PURPOSE, NOTIFY LAB WITHIN 5 DAYS.  LOWEST DETECTABLE LIMITS FOR URINE DRUG SCREEN Drug Class                     Cutoff (ng/mL) Amphetamine and metabolites    1000 Barbiturate and metabolites    200 Benzodiazepine                 200 Opiates and metabolites        300 Cocaine and metabolites        300 THC                            50 Performed at [facility], [address] [place], [place]  [address]    Creatinine, Ser 05/10/2023 1.68 (H)  0.44 - 1.00 mg/dL Final   GFR, Estimated 05/10/2023 31 (L)  >60 mL/min Final   Comment: (NOTE) Calculated using the CKD-EPI Creatinine Equation (2021) Performed at [facility] [address], [place], [place] [address]   Admission on 05/06/2023, Discharged on 05/07/2023  Component Date Value Ref Range Status   WBC 05/06/2023 9.9  4.0 - 10.5 K/uL Final   RBC 05/06/2023 4.16  3.87 - 5.11 MIL/uL Final   Hemoglobin 05/06/2023 11.9 (L)  12.0 - 15.0 g/dL Final   HCT 97/74/7974 38.1  36.0 - 46.0 % Final   MCV 05/06/2023 91.6  80.0 - 100.0 fL Final   MCH 05/06/2023 28.6  26.0 - 34.0 pg Final   MCHC 05/06/2023 31.2  30.0 - 36.0 g/dL Final   RDW 97/74/7974 13.4  11.5 - 15.5 % Final   Platelets 05/06/2023  275  150 - 400 K/uL Final   nRBC 05/06/2023 0.0  0.0 - 0.2 % Final   Neutrophils Relative % 05/06/2023 59  % Final   Neutro Abs 05/06/2023 5.9  1.7 - 7.7 K/uL Final   Lymphocytes Relative 05/06/2023 28  % Final   Lymphs Abs 05/06/2023 2.7  0.7 - 4.0 K/uL Final   Monocytes Relative 05/06/2023 8  % Final   Monocytes Absolute 05/06/2023 0.8  0.1 - 1.0 K/uL Final   Eosinophils Relative 05/06/2023 4  % Final   Eosinophils Absolute 05/06/2023 0.4  0.0 - 0.5 K/uL Final   Basophils Relative 05/06/2023 1  % Final   Basophils Absolute 05/06/2023 0.1  0.0 - 0.1 K/uL Final   Immature Granulocytes 05/06/2023 0  % Final   Abs Immature Granulocytes 05/06/2023 0.04  0.00 - 0.07 K/uL Final   Performed at [facility], [address], [place], [place] [address]   Sodium 05/06/2023 135  135 - 145 mmol/L Final   Potassium 05/06/2023 3.9  3.5 - 5.1 mmol/L Final   Chloride 05/06/2023 105  98 - 111 mmol/L Final   CO2 05/06/2023 19 (L)  22 - 32 mmol/L Final   Glucose, Bld 05/06/2023 299 (H)  70 - 99 mg/dL Final   Glucose reference range applies only to samples taken after fasting for at least 8 hours.   BUN 05/06/2023 26 (H)   8 - 23 mg/dL Final   Creatinine, Ser 05/06/2023 1.56 (H)  0.44 - 1.00 mg/dL Final   Calcium  05/06/2023 9.1  8.9 - 10.3 mg/dL Final   Total Protein 97/74/7974 7.4  6.5 - 8.1 g/dL Final   Albumin 97/74/7974 4.0  3.5 - 5.0 g/dL Final   AST 97/74/7974 13 (L)  15 - 41 U/L Final   ALT 05/06/2023 13  0 - 44 U/L Final   Alkaline Phosphatase 05/06/2023 92  38 - 126 U/L Final   Total Bilirubin 05/06/2023 0.7  0.0 - 1.2 mg/dL Final   GFR, Estimated 05/06/2023 33 (L)  >60 mL/min Final   Comment: (NOTE) Calculated using the CKD-EPI Creatinine Equation (2021)    Anion gap 05/06/2023 11  5 - 15 Final   Performed at [facility], [address], [place], [place] [address]   Color, Urine 05/06/2023 YELLOW  YELLOW Final   APPearance 05/06/2023 CLOUDY (A)  CLEAR Final   Specific Gravity, Urine 05/06/2023 1.018  1.005 - 1.030 Final   pH 05/06/2023 5.0  5.0 - 8.0 Final   Glucose, UA 05/06/2023 50 (A)  NEGATIVE mg/dL Final   Hgb urine dipstick 05/06/2023 NEGATIVE  NEGATIVE Final   Bilirubin Urine 05/06/2023 NEGATIVE  NEGATIVE Final   Ketones, ur 05/06/2023 NEGATIVE  NEGATIVE mg/dL Final   Protein, ur 97/74/7974 NEGATIVE  NEGATIVE mg/dL Final   Nitrite 97/74/7974 POSITIVE (A)  NEGATIVE Final   Leukocytes,Ua 05/06/2023 LARGE (A)  NEGATIVE Final   RBC / HPF 05/06/2023 6-10  0 - 5 RBC/hpf Final   WBC, UA 05/06/2023 >50  0 - 5 WBC/hpf Final   Bacteria, UA 05/06/2023 MANY (A)  NONE SEEN Final   Squamous Epithelial / HPF 05/06/2023 0-5  0 - 5 /HPF Final   WBC Clumps 05/06/2023 PRESENT   Final   Mucus 05/06/2023 PRESENT   Final   Hyaline Casts, UA 05/06/2023 PRESENT   Final   Performed at [facility], [address], [place], [place] [address]   Alcohol, Ethyl (B) 05/06/2023 <10  <  10 mg/dL Final   Comment: (NOTE) Lowest detectable limit for serum alcohol is 10 mg/dL.  For medical purposes only. Performed at [facility], [address], [place], [place] [address]    Acetaminophen  (Tylenol ), Serum 05/06/2023 <10 (L)  10 - 30 ug/mL Final   Comment: (NOTE) Therapeutic concentrations vary significantly. A range of 10-30 ug/mL  may be an effective concentration for many patients. However, some  are best treated at concentrations outside of this range. Acetaminophen  concentrations >150 ug/mL at 4 hours after ingestion  and >50 ug/mL at 12 hours after ingestion are often associated with  toxic reactions.  Performed at [facility], [address], [place], [place] [address]    Opiates 05/06/2023 NONE DETECTED  NONE DETECTED Final   Cocaine 05/06/2023 NONE DETECTED  NONE DETECTED Final   Benzodiazepines 05/06/2023 NONE DETECTED  NONE DETECTED Final   Amphetamines 05/06/2023 NONE DETECTED  NONE DETECTED Final   Tetrahydrocannabinol 05/06/2023 NONE DETECTED  NONE DETECTED Final   Barbiturates 05/06/2023 NONE DETECTED  NONE DETECTED Final   Comment: (NOTE) DRUG SCREEN FOR MEDICAL PURPOSES ONLY.  IF CONFIRMATION IS NEEDED FOR ANY PURPOSE, NOTIFY LAB WITHIN 5 DAYS.  LOWEST DETECTABLE LIMITS FOR URINE DRUG SCREEN Drug Class                     Cutoff (ng/mL) Amphetamine and metabolites    1000 Barbiturate and metabolites    200 Benzodiazepine                 200 Opiates and metabolites        300 Cocaine and metabolites        300 THC                            50 Performed at [facility], [address], [place], [place] [address]   Admission on 04/28/2023, Discharged on 04/30/2023  Component Date Value Ref Range Status   Sodium 04/28/2023 133 (L)  135 - 145 mmol/L Final   Potassium 04/28/2023 4.2  3.5 - 5.1 mmol/L Final   Chloride 04/28/2023 105  98 - 111 mmol/L Final   CO2 04/28/2023 20 (L)  22 - 32 mmol/L Final   Glucose, Bld 04/28/2023 346 (H)  70 - 99 mg/dL Final   Glucose reference range applies only to samples taken after fasting for at least 8 hours.   BUN 04/28/2023  13  8 - 23 mg/dL Final   Creatinine, Ser 04/28/2023 1.33 (H)  0.44 - 1.00 mg/dL Final   Calcium  04/28/2023 8.8 (L)  8.9 - 10.3 mg/dL Final   Total Protein 97/82/7974 7.5  6.5 - 8.1 g/dL Final   Albumin 97/82/7974 3.8  3.5 - 5.0 g/dL Final   AST 97/82/7974 16  15 - 41 U/L Final   ALT 04/28/2023 13  0 - 44 U/L Final   Alkaline Phosphatase 04/28/2023 79  38 - 126 U/L Final   Total Bilirubin 04/28/2023 0.4  0.0 - 1.2 mg/dL Final   GFR, Estimated 04/28/2023 40 (L)  >60 mL/min Final   Comment: (NOTE) Calculated using the CKD-EPI Creatinine Equation (2021)    Anion gap 04/28/2023 8  5 - 15 Final   Performed at [facility], [address] [place], [place] [address]   Alcohol, Ethyl (B) 04/28/2023 <10  <10 mg/dL Final   Comment: (NOTE) Lowest detectable limit for  serum alcohol is 10 mg/dL.  For medical purposes only. Performed at [facility], [address] [place], [place] [address]    Salicylate Lvl 04/28/2023 <7.0 (L)  7.0 - 30.0 mg/dL Final   Performed at [facility], [address], [place], [place] [address]   Acetaminophen  (Tylenol ), Serum 04/28/2023 <10 (L)  10 - 30 ug/mL Final   Comment: (NOTE) Therapeutic concentrations vary significantly. A range of 10-30 ug/mL  may be an effective concentration for many patients. However, some  are best treated at concentrations outside of this range. Acetaminophen  concentrations >150 ug/mL at 4 hours after ingestion  and >50 ug/mL at 12 hours after ingestion are often associated with  toxic reactions.  Performed at [facility], [address] [place], [place] [address]    Opiates 04/29/2023 NONE DETECTED  NONE DETECTED Final   Cocaine 04/29/2023 NONE DETECTED  NONE DETECTED Final   Benzodiazepines 04/29/2023 NONE DETECTED  NONE DETECTED Final   Amphetamines 04/29/2023 NONE DETECTED  NONE DETECTED Final   Tetrahydrocannabinol 04/29/2023 NONE DETECTED  NONE  DETECTED Final   Barbiturates 04/29/2023 NONE DETECTED  NONE DETECTED Final   Comment: (NOTE) DRUG SCREEN FOR MEDICAL PURPOSES ONLY.  IF CONFIRMATION IS NEEDED FOR ANY PURPOSE, NOTIFY LAB WITHIN 5 DAYS.  LOWEST DETECTABLE LIMITS FOR URINE DRUG SCREEN Drug Class                     Cutoff (ng/mL) Amphetamine and metabolites    1000 Barbiturate and metabolites    200 Benzodiazepine                 200 Opiates and metabolites        300 Cocaine and metabolites        300 THC                            50 Performed at [facility], [address] [place], [place] [address]    WBC 04/28/2023 7.4  4.0 - 10.5 K/uL Final   RBC 04/28/2023 4.06  3.87 - 5.11 MIL/uL Final   Hemoglobin 04/28/2023 11.6 (L)  12.0 - 15.0 g/dL Final   HCT 97/82/7974 36.0  36.0 - 46.0 % Final   MCV 04/28/2023 88.7  80.0 - 100.0 fL Final   MCH 04/28/2023 28.6  26.0 - 34.0 pg Final   MCHC 04/28/2023 32.2  30.0 - 36.0 g/dL Final   RDW 97/82/7974 13.4  11.5 - 15.5 % Final   Platelets 04/28/2023 236  150 - 400 K/uL Final   nRBC 04/28/2023 0.0  0.0 - 0.2 % Final   Neutrophils Relative % 04/28/2023 71  % Final   Neutro Abs 04/28/2023 5.3  1.7 - 7.7 K/uL Final   Lymphocytes Relative 04/28/2023 20  % Final   Lymphs Abs 04/28/2023 1.5  0.7 - 4.0 K/uL Final   Monocytes Relative 04/28/2023 5  % Final   Monocytes Absolute 04/28/2023 0.4  0.1 - 1.0 K/uL Final   Eosinophils Relative 04/28/2023 3  % Final   Eosinophils Absolute 04/28/2023 0.2  0.0 - 0.5 K/uL Final   Basophils Relative 04/28/2023 1  % Final   Basophils Absolute 04/28/2023 0.1  0.0 - 0.1 K/uL Final   Immature Granulocytes 04/28/2023 0  % Final   Abs Immature Granulocytes 04/28/2023 0.02  0.00 - 0.07 K/uL Final   Performed at [facility],  [address] [place], [place] [address]   Specimen Source 04/29/2023 URINE, UNSPE   Final   Color, Urine 04/29/2023 YELLOW  YELLOW Final   APPearance 04/29/2023 CLEAR  CLEAR Final    Specific Gravity, Urine 04/29/2023 1.010  1.005 - 1.030 Final   pH 04/29/2023 5.0  5.0 - 8.0 Final   Glucose, UA 04/29/2023 >=500 (A)  NEGATIVE mg/dL Final   Hgb urine dipstick 04/29/2023 NEGATIVE  NEGATIVE Final   Bilirubin Urine 04/29/2023 NEGATIVE  NEGATIVE Final   Ketones, ur 04/29/2023 NEGATIVE  NEGATIVE mg/dL Final   Protein, ur 97/81/7974 NEGATIVE  NEGATIVE mg/dL Final   Nitrite 97/81/7974 NEGATIVE  NEGATIVE Final   Leukocytes,Ua 04/29/2023 MODERATE (A)  NEGATIVE Final   RBC / HPF 04/29/2023 0-5  0 - 5 RBC/hpf Final   WBC, UA 04/29/2023 21-50  0 - 5 WBC/hpf Final   Comment:        Reflex urine culture not performed if WBC <=10, OR if Squamous epithelial cells >5. If Squamous epithelial cells >5 suggest recollection.    Bacteria, UA 04/29/2023 RARE (A)  NONE SEEN Final   Squamous Epithelial / HPF 04/29/2023 0-5  0 - 5 /HPF Final   Mucus 04/29/2023 PRESENT   Final   Performed at [facility], [address] [place], [place] [address]   SARS Coronavirus 2 by RT PCR 04/29/2023 NEGATIVE  NEGATIVE Final   Comment: (NOTE) SARS-CoV-2 target nucleic acids are NOT DETECTED.  The SARS-CoV-2 RNA is generally detectable in upper and lower respiratory specimens during the acute phase of infection. The lowest concentration of SARS-CoV-2 viral copies this assay can detect is 250 copies / mL. A negative result does not preclude SARS-CoV-2 infection and should not be used as the sole basis for treatment or other patient management decisions.  A negative result may occur with improper specimen collection / handling, submission of specimen other than nasopharyngeal swab, presence of viral mutation(s) within the areas targeted by this assay, and inadequate number of viral copies (<250 copies / mL). A negative result must be combined with clinical observations, patient history, and epidemiological information.  Fact Sheet for Patients:    RoadLapTop.co.za  Fact Sheet for Healthcare Providers: http://kim-miller.com/  This test is not yet approved or                           cleared by the [place]  FDA and has been authorized for detection and/or diagnosis of SARS-CoV-2 by FDA under an Emergency Use Authorization (EUA).  This EUA will remain in effect (meaning this test can be used) for the duration of the COVID-19 declaration under Section 564(b)(1) of the Act, 21 U.S.C. section 360bbb-3(b)(1), unless the authorization is terminated or revoked sooner.  Performed at [facility], [address], [place], [place] [address]    Glucose-Capillary 04/28/2023 261 (H)  70 - 99 mg/dL Final   Glucose reference range applies only to samples taken after fasting for at least 8 hours.   Specimen Description 04/29/2023    Final                   Value:URINE, RANDOM Performed at [facility], [address], [place], [place] [address]    Special Requests 04/29/2023    Final                   Value:NONE Reflexed from [id number] Performed at [facility], [address] [place], [place] [address]    Culture 04/29/2023 30,000 COLONIES/mL KLEBSIELLA PNEUMONIAE (A)   Final   Report Status 04/29/2023 05/01/2023 FINAL   Final   Organism ID, Bacteria 04/29/2023 KLEBSIELLA PNEUMONIAE (A)   Final   Glucose-Capillary 04/29/2023 171 (H)  70 - 99 mg/dL Final   Glucose reference range applies only to samples taken after fasting for at least 8 hours.   Glucose-Capillary 04/29/2023 159 (H)  70 - 99 mg/dL Final   Glucose reference range applies only to samples taken after fasting for at least 8 hours.  There may be more visits with results that are not included.    Blood Alcohol level:  Lab Results  Component Value Date   [place] [facility] <15 09/02/2023   ETH <10 05/28/2023    Metabolic Disorder Labs: Lab Results  Component Value Date   HGBA1C  8.7 (H) 06/13/2023   MPG 203 06/13/2023   MPG 228.82 02/27/2023   No results found for: PROLACTIN Lab Results  Component Value Date   CHOL 115 03/13/2023   TRIG 200.0 (H) 03/13/2023   HDL 32.90 (L) 03/13/2023   CHOLHDL 3 03/13/2023   VLDL 40.0 03/13/2023   LDLCALC 42 03/13/2023   LDLCALC 40 05/02/2020    Physical Findings   AUDIT    Flowsheet Row Admission (Discharged) from [facility]/17/2023 [facility]  Alcohol Use Disorder Identification Test Final Score (AUDIT) 0   GAD-7    Flowsheet Row ED from 05/15/2023 in [facility] Office Visit from 12[facility]27/2022 in [facility] [place] Office Visit from 08/23/2022 in [facility] Office Visit from 08/20/2022 in [facility]  Total GAD-7 Score 11 11 0 7   Mini-Mental    Flowsheet Row Clinical Support from 12/26/2016 [facility]  Total Score (max 30 points ) 20   PHQ2-9    Flowsheet Row Office Visit from 12/27/2022 in [name] Clinical Support from 12/03/2022 in [facility] [place] Office Visit from 08[facility]23/2022 in [facility] Office Visit from 08/20/2022 in [facility] Office Visit from 04/02/2022 in [facility]  PHQ-2 Total Score 2 0 0 4 0  PHQ-9 Total Score 11 -- 0 10 --   Flowsheet Row ED from 09/02/2023 in [facility] Most recent reading at 09/02/2023 11:05 PM ED from 09/02/2023 in [facility] Most recent reading at 09/02/2023 10:40 AM ED from 09/02/2023 in [facility] Most recent reading at 09/02/2023  7:51 AM  C-SSRS RISK CATEGORY High Risk High Risk High Risk     Musculoskeletal   Strength & Muscle Tone: within normal limits Gait & Station: unsteady Patient leans: N/A  Psychiatric Specialty Exam  Presentation  General Appearance:  Appropriate for Environment  Eye Contact: Fair  Speech: Normal Rate  Speech Volume: Normal  Handedness: Right   Mood and Affect  Mood: Anxious; Depressed  Affect: Flat   Thought Process  Thought Processes: Coherent  Descriptions of Associations:Intact  Orientation:Full (Time, Place and Person)  Thought Content:Scattered  Diagnosis of Schizophrenia or Schizoaffective disorder in past: No    Hallucinations:Hallucinations: None  Ideas of Reference:None  Suicidal Thoughts:Suicidal Thoughts: Yes, Passive SI  Active Intent and/or Plan: With Plan SI Passive Intent and/or Plan: With Plan  Homicidal Thoughts:Homicidal Thoughts: Yes, Passive HI Passive Intent and/or Plan: Without Plan   Sensorium  Memory: Immediate Fair  Judgment: Poor  Insight: Fair   Chartered certified accountant: Poor  Attention Span: Poor  Recall: [employer] of Knowledge: Fair  Language: Fair   Psychomotor Activity  Psychomotor Activity: Psychomotor Activity: Normal   Assets  Assets: Desire for Improvement; Housing; Social Support   Sleep  Sleep: Sleep: Fair  No Safety Checks orders active in given range  Nutritional Assessment (For OBS and FBC admissions only) Has the patient had a weight loss or gain of 10 pounds or more in the last 3 months?: Yes Has the patient had a decrease in food intake/or appetite?: Yes Does the patient have dental problems?: No Does the patient have eating habits or behaviors that may be indicators of an eating disorder including binging or inducing vomiting?: No Has the patient recently lost weight without trying?: 0 Has the patient been eating poorly because of a decreased appetite?: 0 Malnutrition Screening Tool Score: 0    Physical Exam  Physical  Exam Constitutional:      General: She is not in acute distress.    Appearance: Normal appearance. She is not ill-appearing.  HENT:     Head: Normocephalic and atraumatic.     Nose: Nose normal.   Eyes:     Extraocular Movements: Extraocular movements intact.     Conjunctiva/sclera: Conjunctivae normal.     Pupils: Pupils are equal, round, and reactive to light.    Cardiovascular:     Rate and Rhythm: Normal rate and regular rhythm.  Pulmonary:     Effort: Pulmonary effort is normal.     Breath sounds: Normal breath sounds.   Musculoskeletal:     Cervical back: Normal range of motion and neck supple.   Skin:    General: Skin is warm and dry.   Neurological:     General: No focal deficit present.     Mental Status: She is alert and oriented to person, place, and time.    Review of Systems  Genitourinary:  Positive for frequency and urgency.  Psychiatric/Behavioral:  Positive for hallucinations (hearing a males voice).     Blood pressure (!) 140/91, pulse (!) 108, temperature 98.5 F (36.9 C), temperature source Oral, resp. rate 16, SpO2 98%. There is no height or weight on file to calculate BMI.  Treatment Plan Summary: Daily contact with patient to assess and evaluate symptoms and progress in treatment, Medication management, and Plan: - Continue to work with social work to find appropriate placement - APS report filed and accepted -Hyperglycemia, diabetes before meals and at bedtime with sliding scale coverage ordered - Urine culture resulted with a preliminary result showing greater than 100 colonies of Klebsiella Oxytoca: Culture and sensitivity results were still pending.  We will continue treatment with Vantin 100 mg twice daily x 7 days. - Continue home meds, restarted patient's metoprolol  at a lower dose previously prescribed at 25 mg twice daily however patient has not consistently been taking the metoprolol  therefore restarted at 12.5 mg twice daily will titrate  as needed. - Contact dermatitis treated with triamcinolone  cream 3 times daily as needed applied to bilateral upper extremities hydroxyzine  10 mg 3 times daily as needed for itching and/or anxiety   At present still recommending inpatient psychiatric treatment placement however if unable to be placed will continue to work  with social work on a safe and appropriate discharge plan.  Suzen Lesches, NP 09/03/2023 9:15 AM

## 2023-09-04 DIAGNOSIS — Z7902 Long term (current) use of antithrombotics/antiplatelets: Secondary | ICD-10-CM | POA: Diagnosis not present

## 2023-09-04 DIAGNOSIS — Z5902 Unsheltered homelessness: Secondary | ICD-10-CM | POA: Diagnosis not present

## 2023-09-04 DIAGNOSIS — F329 Major depressive disorder, single episode, unspecified: Secondary | ICD-10-CM | POA: Diagnosis not present

## 2023-09-04 DIAGNOSIS — E1122 Type 2 diabetes mellitus with diabetic chronic kidney disease: Secondary | ICD-10-CM | POA: Diagnosis not present

## 2023-09-04 DIAGNOSIS — I4891 Unspecified atrial fibrillation: Secondary | ICD-10-CM | POA: Diagnosis not present

## 2023-09-04 DIAGNOSIS — N183 Chronic kidney disease, stage 3 unspecified: Secondary | ICD-10-CM | POA: Diagnosis not present

## 2023-09-04 DIAGNOSIS — Z951 Presence of aortocoronary bypass graft: Secondary | ICD-10-CM | POA: Diagnosis not present

## 2023-09-04 DIAGNOSIS — I129 Hypertensive chronic kidney disease with stage 1 through stage 4 chronic kidney disease, or unspecified chronic kidney disease: Secondary | ICD-10-CM | POA: Diagnosis not present

## 2023-09-04 DIAGNOSIS — L80 Vitiligo: Secondary | ICD-10-CM | POA: Diagnosis not present

## 2023-09-04 DIAGNOSIS — R45851 Suicidal ideations: Secondary | ICD-10-CM

## 2023-09-04 DIAGNOSIS — I2510 Atherosclerotic heart disease of native coronary artery without angina pectoris: Secondary | ICD-10-CM | POA: Diagnosis not present

## 2023-09-04 DIAGNOSIS — N390 Urinary tract infection, site not specified: Secondary | ICD-10-CM | POA: Diagnosis not present

## 2023-09-04 DIAGNOSIS — Z7901 Long term (current) use of anticoagulants: Secondary | ICD-10-CM | POA: Diagnosis not present

## 2023-09-04 DIAGNOSIS — Z794 Long term (current) use of insulin: Secondary | ICD-10-CM | POA: Diagnosis not present

## 2023-09-04 DIAGNOSIS — Z91148 Patient's other noncompliance with medication regimen for other reason: Secondary | ICD-10-CM | POA: Diagnosis not present

## 2023-09-04 DIAGNOSIS — Z7984 Long term (current) use of oral hypoglycemic drugs: Secondary | ICD-10-CM | POA: Diagnosis not present

## 2023-09-04 LAB — GLUCOSE, CAPILLARY
Glucose-Capillary: 264 mg/dL — ABNORMAL HIGH (ref 70–99)
Glucose-Capillary: 261 mg/dL — ABNORMAL HIGH (ref 70–99)
Glucose-Capillary: 123 mg/dL — ABNORMAL HIGH (ref 70–99)
Glucose-Capillary: 176 mg/dL — ABNORMAL HIGH (ref 70–99)

## 2023-09-04 LAB — URINE CULTURE: Culture: >=100000 — AB

## 2023-09-04 NOTE — ED Notes (Signed)
 Patient observed/assessed at bedside lying in bed asleep. Patient alert and oriented to self and location. Patient denies pain, but endorsed anxiety and depression. She denies A/V/H. She denies having any thoughts/plan of self harm and harm towards others. Fluid and snack offered. Insulin  administered, pt tolerated it well. Writer informed pt to inform any of the staff whenever she is ready to change her brief. Verbalizes no further complaints at this time.

## 2023-09-04 NOTE — Care Management (Addendum)
 OBS Care Management   Patient met with the APS worker (Daphanie).  Per Daphanie, the open APS case is still being investigated.  10:55am  Writer coordinated with [facility] on the 2nd floor *Marien Louder [phone]).   Marien is able to assist with obtaining Medicaid insurance.  Marien will come to the unit and meet with the patient in order to submit her information for her on line in order to obtain Medicaid insurance.   11:02am  Writer spoke to Catering manager of the [employer]  Writer has arranged for the patient to meet with the intake worker to assess the patient today at 4pm for possible placement in their program.   11:26am  Marien is meeting with the patient in order to process the paperwork for her Medicaid application.

## 2023-09-04 NOTE — ED Notes (Signed)
 Patient observed/assessed in bed/chair resting quietly appearing in no distress and verbalizing no complaints at this time. Will continue to monitor.

## 2023-09-04 NOTE — ED Notes (Signed)
 Pt was changed and cleaned by Clinical research associate. She is safe on the unit at this time with continuous observation in place.

## 2023-09-04 NOTE — Progress Notes (Signed)
 Pt given salad and juice.  No distress noted.

## 2023-09-04 NOTE — ED Notes (Signed)
 SW in at this time to speak with pt.

## 2023-09-04 NOTE — ED Provider Notes (Signed)
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 0.7  0.0 - 1.2 mg/dL Final   GFR, Estimated 05/15/2023 44 (L)  >60 mL/min Final   Comment: (NOTE) Calculated using the CKD-EPI Creatinine Equation (2021)    Anion gap 05/15/2023 11  5 - 15 Final   Performed at [facility], [address] [place], [place] [address]   Alcohol, Ethyl (B) 05/15/2023 <10  <10 mg/dL Final   Comment: (NOTE) Lowest detectable limit for serum alcohol is 10 mg/dL.  For medical purposes only. Performed at [facility], [address], [place], [place] [address]    Salicylate Lvl 05/15/2023 <7.0 (L)  7.0 - 30.0 mg/dL Final   Performed at [facility], [address] [place], [place] [address]   Acetaminophen  (Tylenol ), Serum 05/15/2023 <10 (L)  10 - 30 ug/mL Final   Comment: (NOTE) Therapeutic concentrations vary significantly. A range of 10-30 ug/mL  may be an effective concentration for many patients. However, some  are best treated at concentrations outside of this range. Acetaminophen  concentrations >150 ug/mL at 4 hours after ingestion  and >50 ug/mL at 12 hours after ingestion are often associated with  toxic reactions.  Performed at [facility], [address], [place], [place] [address]    WBC 05/15/2023 8.6  4.0 - 10.5 K/uL Final   RBC 05/15/2023 4.27  3.87 - 5.11 MIL/uL Final   Hemoglobin 05/15/2023 12.3  12.0 - 15.0 g/dL Final   HCT 96/93/7974 37.4  36.0 - 46.0 % Final   MCV 05/15/2023 87.6  80.0 - 100.0 fL Final   MCH 05/15/2023 28.8  26.0 - 34.0 pg Final   MCHC 05/15/2023 32.9  30.0 - 36.0 g/dL Final   RDW 96/93/7974 13.6  11.5 - 15.5 % Final   Platelets 05/15/2023 290  150 - 400 K/uL Final   nRBC 05/15/2023 0.0  0.0 - 0.2 % Final   Performed at [facility], [address] [place], [place] [address]   Opiates 05/15/2023 NONE DETECTED  NONE DETECTED Final   Cocaine 05/15/2023 NONE DETECTED  NONE DETECTED Final   Benzodiazepines 05/15/2023 NONE DETECTED  NONE DETECTED Final   Amphetamines 05/15/2023 NONE DETECTED  NONE DETECTED Final   Tetrahydrocannabinol 05/15/2023 NONE DETECTED  NONE DETECTED Final   Barbiturates 05/15/2023 NONE DETECTED  NONE DETECTED Final   Comment: (NOTE) DRUG SCREEN FOR MEDICAL PURPOSES ONLY.  IF CONFIRMATION IS NEEDED FOR ANY PURPOSE, NOTIFY LAB WITHIN 5 DAYS.  LOWEST DETECTABLE LIMITS FOR URINE DRUG SCREEN Drug Class  Cutoff (ng/mL) Amphetamine and  metabolites    1000 Barbiturate and metabolites    200 Benzodiazepine                 200 Opiates and metabolites        300 Cocaine and metabolites        300 THC                            50 Performed at [facility], [address] [place], [place] [address]    Glucose-Capillary 05/15/2023 309 (H)  70 - 99 mg/dL Final   Glucose reference range applies only to samples taken after fasting for at least 8 hours.   Glucose-Capillary 05/16/2023 282 (H)  70 - 99 mg/dL Final   Glucose reference range applies only to samples taken after fasting for at least 8 hours.   Comment 1 05/16/2023 Notify RN   Final   Glucose-Capillary 05/16/2023 345 (H)  70 - 99 mg/dL Final   Glucose reference range applies only to samples taken after fasting for at least 8 hours.   Comment 1 05/16/2023 Notify RN   Final   Glucose-Capillary 05/16/2023 271 (H)  70 - 99 mg/dL Final   Glucose reference range applies only to samples taken after fasting for at least 8 hours.   Comment 1 05/16/2023 Notify RN   Final   Glucose-Capillary 05/17/2023 111 (H)  70 - 99 mg/dL Final   Glucose reference range applies only to samples taken after fasting for at least 8 hours.   Glucose-Capillary 05/17/2023 252 (H)  70 - 99 mg/dL Final   Glucose reference range applies only to samples taken after fasting for at least 8 hours.   Glucose-Capillary 05/17/2023 246 (H)  70 - 99 mg/dL Final   Glucose reference range applies only to samples taken after fasting for at least 8 hours.   Color, Urine 05/17/2023 YELLOW  YELLOW Final   APPearance 05/17/2023 CLOUDY (A)  CLEAR Final   Specific Gravity, Urine 05/17/2023 1.016  1.005 - 1.030 Final   pH 05/17/2023 5.0  5.0 - 8.0 Final   Glucose, UA 05/17/2023 NEGATIVE  NEGATIVE mg/dL Final   Hgb urine dipstick 05/17/2023 NEGATIVE  NEGATIVE Final   Bilirubin Urine 05/17/2023 NEGATIVE  NEGATIVE Final   Ketones, ur 05/17/2023 NEGATIVE  NEGATIVE mg/dL Final   Protein, ur 96/91/7974  30 (A)  NEGATIVE mg/dL Final   Nitrite 96/91/7974 NEGATIVE  NEGATIVE Final   Leukocytes,Ua 05/17/2023 LARGE (A)  NEGATIVE Final   RBC / HPF 05/17/2023 21-50  0 - 5 RBC/hpf Final   WBC, UA 05/17/2023 >50  0 - 5 WBC/hpf Final   Bacteria, UA 05/17/2023 MANY (A)  NONE SEEN Final   Squamous Epithelial / HPF 05/17/2023 0-5  0 - 5 /HPF Final   WBC Clumps 05/17/2023 PRESENT   Final   Performed at [facility], [address], [place], [place] [address]   Specimen Description 05/17/2023    Final                   Value:URINE, CLEAN CATCH Performed at [facility], [address], [place], [place] [address]    Special Requests 05/17/2023    Final                   Value:NONE Performed at [facility], [address], [place], [place] [address]  Culture 05/17/2023 >=100,000 COLONIES/mL CITROBACTER FREUNDII (A)   Final   Report Status 05/17/2023 05/19/2023 FINAL   Final   Organism ID, Bacteria 05/17/2023 CITROBACTER FREUNDII (A)   Final   Glucose-Capillary 05/17/2023 94  70 - 99 mg/dL Final   Glucose reference range applies only to samples taken after fasting for at least 8 hours.   Glucose-Capillary 05/18/2023 105 (H)  70 - 99 mg/dL Final   Glucose reference range applies only to samples taken after fasting for at least 8 hours.   Glucose-Capillary 05/18/2023 153 (H)  70 - 99 mg/dL Final   Glucose reference range applies only to samples taken after fasting for at least 8 hours.   Glucose-Capillary 05/19/2023 65 (L)  70 - 99 mg/dL Final   Glucose reference range applies only to samples taken after fasting for at least 8 hours.   Glucose-Capillary 05/19/2023 100 (H)  70 - 99 mg/dL Final   Glucose reference range applies only to samples taken after fasting for at least 8 hours.   Glucose-Capillary 05/19/2023 145 (H)  70 - 99 mg/dL Final   Glucose reference range applies only to samples taken after fasting for at least 8 hours.   Comment 1  05/19/2023 Notify RN   Final   Glucose-Capillary 05/19/2023 214 (H)  70 - 99 mg/dL Final   Glucose reference range applies only to samples taken after fasting for at least 8 hours.   Comment 1 05/19/2023 Notify RN   Final   Glucose-Capillary 05/19/2023 158 (H)  70 - 99 mg/dL Final   Glucose reference range applies only to samples taken after fasting for at least 8 hours.   Glucose-Capillary 05/20/2023 185 (H)  70 - 99 mg/dL Final   Glucose reference range applies only to samples taken after fasting for at least 8 hours.   Glucose-Capillary 05/20/2023 144 (H)  70 - 99 mg/dL Final   Glucose reference range applies only to samples taken after fasting for at least 8 hours.   Glucose-Capillary 05/21/2023 228 (H)  70 - 99 mg/dL Final   Glucose reference range applies only to samples taken after fasting for at least 8 hours.   Comment 1 05/21/2023 Notify RN   Final   Glucose-Capillary 05/22/2023 166 (H)  70 - 99 mg/dL Final   Glucose reference range applies only to samples taken after fasting for at least 8 hours.   Comment 1 05/22/2023 Notify RN   Final   Glucose-Capillary 05/22/2023 166 (H)  70 - 99 mg/dL Final   Glucose reference range applies only to samples taken after fasting for at least 8 hours.   Comment 1 05/22/2023 Notify RN   Final   Glucose-Capillary 05/22/2023 186 (H)  70 - 99 mg/dL Final   Glucose reference range applies only to samples taken after fasting for at least 8 hours.  Office Visit on 05/14/2023  Component Date Value Ref Range Status   Color, UA 05/14/2023 yellow   Final   Clarity, UA 05/14/2023 cloudy   Final   Glucose, UA 05/14/2023 Negative  Negative Final   Bilirubin, UA 05/14/2023 negative   Final   Ketones, UA 05/14/2023 negative   Final   Spec Grav, UA 05/14/2023 1.020  1.010 - 1.025 Final   Blood, UA 05/14/2023 negative   Final   pH, UA 05/14/2023 5.5  5.0 - 8.0 Final   Protein, UA 05/14/2023 Negative  Negative Final   Urobilinogen, UA 05/14/2023 0.2  0.2 or  1.0 E.U./dL Final  Nitrite, UA 05/14/2023 negative   Final   Leukocytes, UA 05/14/2023 Small (1+) (A)  Negative Final   70 Leu/uL   MICRO NUMBER: 05/14/2023 [id number]   Final   SPECIMEN QUALITY: 05/14/2023 Adequate   Final   Sample Source 05/14/2023 URINE, CLEAN CATCH   Final   STATUS: 05/14/2023 FINAL   Final   Result: 05/14/2023    Final                   Value:Mixed genital flora isolated. These superficial bacteria are not indicative of a urinary tract infection. No further organism identification is warranted on this specimen. If clinically indicated, recollect clean-catch, mid-stream urine and transfer  immediately to Urine Culture Transport Tube.   Admission on 05/09/2023, Discharged on 05/12/2023  Component Date Value Ref Range Status   Glucose-Capillary 05/09/2023 174 (H)  70 - 99 mg/dL Final   Glucose reference range applies only to samples taken after fasting for at least 8 hours.   Opiates 05/10/2023 NONE DETECTED  NONE DETECTED Final   Cocaine 05/10/2023 NONE DETECTED  NONE DETECTED Final   Benzodiazepines 05/10/2023 NONE DETECTED  NONE DETECTED Final   Amphetamines 05/10/2023 NONE DETECTED  NONE DETECTED Final   Tetrahydrocannabinol 05/10/2023 NONE DETECTED  NONE DETECTED Final   Barbiturates 05/10/2023 NONE DETECTED  NONE DETECTED Final   Comment: (NOTE) DRUG SCREEN FOR MEDICAL PURPOSES ONLY.  IF CONFIRMATION IS NEEDED FOR ANY PURPOSE, NOTIFY LAB WITHIN 5 DAYS.  LOWEST DETECTABLE LIMITS FOR URINE DRUG SCREEN Drug Class                     Cutoff (ng/mL) Amphetamine and metabolites    1000 Barbiturate and metabolites    200 Benzodiazepine                 200 Opiates and metabolites        300 Cocaine and metabolites        300 THC                            50 Performed at [facility], [address] [place], [place] [address]    Creatinine, Ser 05/10/2023 1.68 (H)  0.44 - 1.00 mg/dL Final   GFR, Estimated 05/10/2023 31 (L)  >60 mL/min Final    Comment: (NOTE) Calculated using the CKD-EPI Creatinine Equation (2021) Performed at [facility], [address], [place], [place] [address]   Admission on 05/06/2023, Discharged on 05/07/2023  Component Date Value Ref Range Status   WBC 05/06/2023 9.9  4.0 - 10.5 K/uL Final   RBC 05/06/2023 4.16  3.87 - 5.11 MIL/uL Final   Hemoglobin 05/06/2023 11.9 (L)  12.0 - 15.0 g/dL Final   HCT 97/74/7974 38.1  36.0 - 46.0 % Final   MCV 05/06/2023 91.6  80.0 - 100.0 fL Final   MCH 05/06/2023 28.6  26.0 - 34.0 pg Final   MCHC 05/06/2023 31.2  30.0 - 36.0 g/dL Final   RDW 97/74/7974 13.4  11.5 - 15.5 % Final   Platelets 05/06/2023 275  150 - 400 K/uL Final   nRBC 05/06/2023 0.0  0.0 - 0.2 % Final   Neutrophils Relative % 05/06/2023 59  % Final   Neutro Abs 05/06/2023 5.9  1.7 - 7.7 K/uL Final   Lymphocytes Relative 05/06/2023 28  % Final   Lymphs Abs 05/06/2023 2.7  0.7 - 4.0 K/uL Final  Monocytes Relative 05/06/2023 8  % Final   Monocytes Absolute 05/06/2023 0.8  0.1 - 1.0 K/uL Final   Eosinophils Relative 05/06/2023 4  % Final   Eosinophils Absolute 05/06/2023 0.4  0.0 - 0.5 K/uL Final   Basophils Relative 05/06/2023 1  % Final   Basophils Absolute 05/06/2023 0.1  0.0 - 0.1 K/uL Final   Immature Granulocytes 05/06/2023 0  % Final   Abs Immature Granulocytes 05/06/2023 0.04  0.00 - 0.07 K/uL Final   Performed at [facility], [address], [place], [place] [address]   Sodium 05/06/2023 135  135 - 145 mmol/L Final   Potassium 05/06/2023 3.9  3.5 - 5.1 mmol/L Final   Chloride 05/06/2023 105  98 - 111 mmol/L Final   CO2 05/06/2023 19 (L)  22 - 32 mmol/L Final   Glucose, Bld 05/06/2023 299 (H)  70 - 99 mg/dL Final   Glucose reference range applies only to samples taken after fasting for at least 8 hours.   BUN 05/06/2023 26 (H)  8 - 23 mg/dL Final   Creatinine, Ser 05/06/2023 1.56 (H)  0.44 - 1.00 mg/dL Final   Calcium  05/06/2023 9.1  8.9 - 10.3  mg/dL Final   Total Protein 97/74/7974 7.4  6.5 - 8.1 g/dL Final   Albumin 97/74/7974 4.0  3.5 - 5.0 g/dL Final   AST 97/74/7974 13 (L)  15 - 41 U/L Final   ALT 05/06/2023 13  0 - 44 U/L Final   Alkaline Phosphatase 05/06/2023 92  38 - 126 U/L Final   Total Bilirubin 05/06/2023 0.7  0.0 - 1.2 mg/dL Final   GFR, Estimated 05/06/2023 33 (L)  >60 mL/min Final   Comment: (NOTE) Calculated using the CKD-EPI Creatinine Equation (2021)    Anion gap 05/06/2023 11  5 - 15 Final   Performed at [facility], [address], [place], [place] [address]   Color, Urine 05/06/2023 YELLOW  YELLOW Final   APPearance 05/06/2023 CLOUDY (A)  CLEAR Final   Specific Gravity, Urine 05/06/2023 1.018  1.005 - 1.030 Final   pH 05/06/2023 5.0  5.0 - 8.0 Final   Glucose, UA 05/06/2023 50 (A)  NEGATIVE mg/dL Final   Hgb urine dipstick 05/06/2023 NEGATIVE  NEGATIVE Final   Bilirubin Urine 05/06/2023 NEGATIVE  NEGATIVE Final   Ketones, ur 05/06/2023 NEGATIVE  NEGATIVE mg/dL Final   Protein, ur 97/74/7974 NEGATIVE  NEGATIVE mg/dL Final   Nitrite 97/74/7974 POSITIVE (A)  NEGATIVE Final   Leukocytes,Ua 05/06/2023 LARGE (A)  NEGATIVE Final   RBC / HPF 05/06/2023 6-10  0 - 5 RBC/hpf Final   WBC, UA 05/06/2023 >50  0 - 5 WBC/hpf Final   Bacteria, UA 05/06/2023 MANY (A)  NONE SEEN Final   Squamous Epithelial / HPF 05/06/2023 0-5  0 - 5 /HPF Final   WBC Clumps 05/06/2023 PRESENT   Final   Mucus 05/06/2023 PRESENT   Final   Hyaline Casts, UA 05/06/2023 PRESENT   Final   Performed at [facility], [address] [place], [place] [address]   Alcohol, Ethyl (B) 05/06/2023 <10  <10 mg/dL Final   Comment: (NOTE) Lowest detectable limit for serum alcohol is 10 mg/dL.  For medical purposes only. Performed at [facility], [address] [place], [place] [address]    Acetaminophen  (Tylenol ), Serum 05/06/2023 <10 (L)  10 - 30 ug/mL Final   Comment:  (NOTE) Therapeutic concentrations vary significantly. A range of 10-30 ug/mL  may be an effective concentration for many patients. However, some  are best treated at concentrations outside of this range. Acetaminophen  concentrations >150 ug/mL at 4 hours after ingestion  and >50 ug/mL at 12 hours after ingestion are often associated with  toxic reactions.  Performed at [facility], [address] [place], [place] [address]    Opiates 05/06/2023 NONE DETECTED  NONE DETECTED Final   Cocaine 05/06/2023 NONE DETECTED  NONE DETECTED Final   Benzodiazepines 05/06/2023 NONE DETECTED  NONE DETECTED Final   Amphetamines 05/06/2023 NONE DETECTED  NONE DETECTED Final   Tetrahydrocannabinol 05/06/2023 NONE DETECTED  NONE DETECTED Final   Barbiturates 05/06/2023 NONE DETECTED  NONE DETECTED Final   Comment: (NOTE) DRUG SCREEN FOR MEDICAL PURPOSES ONLY.  IF CONFIRMATION IS NEEDED FOR ANY PURPOSE, NOTIFY LAB WITHIN 5 DAYS.  LOWEST DETECTABLE LIMITS FOR URINE DRUG SCREEN Drug Class                     Cutoff (ng/mL) Amphetamine and metabolites    1000 Barbiturate and metabolites    200 Benzodiazepine                 200 Opiates and metabolites        300 Cocaine and metabolites        300 THC                            50 Performed at [facility], [address], [place], [place] [address]   There may be more visits with results that are not included.    Blood Alcohol level:  Lab Results  Component Value Date   [facility] <15 09/02/2023   ETH <10 05/28/2023    Metabolic Disorder Labs: Lab Results  Component Value Date   HGBA1C 8.7 (H) 06/13/2023   MPG 203 06/13/2023   MPG 228.82 02/27/2023   No results found for: PROLACTIN Lab Results  Component Value Date   CHOL 115 03/13/2023   TRIG 200.0 (H) 03/13/2023   HDL 32.90 (L) 03/13/2023   CHOLHDL 3 03/13/2023   VLDL 40.0 03/13/2023   LDLCALC 42 03/13/2023   LDLCALC 40 05/02/2020     Therapeutic Lab Levels: No results found for: LITHIUM No results found for: VALPROATE No results found for: CBMZ  Physical Findings   AUDIT    Flowsheet Row Admission (Discharged) from 03/17/2023 in [facility]IORAL [facility]  Alcohol Use Disorder Identification Test Final Score (AUDIT) 0   GAD-7    Flowsheet Row ED from 05/15/2023 in [facility] Emergency Department at [facility] Office Visit from 12/27/2022 in [facility] Office Visit from 08/23/2022 in [facility] Office Visit from 08/20/2022 in [facility]  Total GAD-7 Score 11 11 0 7   Mini-Mental    Flowsheet Row Clinical Support from 12/26/2016 in [facility]  Total Score (max 30 points ) 20   PHQ2-9    Flowsheet Row Office Visit from 12/27/2022 in [facility] Clinical Support from 12/03/2022 in [facility] Office Visit from 08/23/2022 in [facility] Office Visit from 08/20/2022 in [facility] Office Visit from 04/02/2022 in [facility] Total  Score 2 0 0 4 0  PHQ-9 Total Score 11 -- 0 10 --   Flowsheet Row ED from 09/02/2023 in [facility] Most recent reading at 09/02/2023 11:05 PM ED from 09/02/2023 in [facility] Emergency Department at [facility] Most recent reading at 09/02/2023 10:40 AM ED from 09/02/2023 in [facility] Most recent reading at 09/02/2023  7:51 AM  C-SSRS RISK CATEGORY High Risk High Risk High Risk     Musculoskeletal  Strength & Muscle Tone: within normal limits Gait & Station: normal Patient leans: N/A  Psychiatric Specialty Exam  Presentation  General Appearance:  Appropriate for Environment  Eye  Contact: Fair  Speech: Normal Rate  Speech Volume: Normal  Handedness: Right   Mood and Affect  Mood: Anxious; Depressed  Affect: Flat   Thought Process  Thought Processes: Coherent  Descriptions of Associations:Intact  Orientation:Full (Time, Place and Person)  Thought Content:Scattered  Diagnosis of Schizophrenia or Schizoaffective disorder in past: No    Hallucinations:No data recorded Ideas of Reference:None  Suicidal Thoughts:No data recorded Homicidal Thoughts:No data recorded  Sensorium  Memory: Immediate Fair  Judgment: Poor  Insight: Fair   Art therapist  Concentration: Poor  Attention Span: Poor  Recall: [employer] of Knowledge: Fair  Language: Fair   Psychomotor Activity  Psychomotor Activity:No data recorded  Assets  Assets: Desire for Improvement; Housing; Social Support   Sleep  Sleep:No data recorded No Safety Checks orders active in given range  No data recorded  Physical Exam  Physical Exam Vitals and nursing note reviewed.  Constitutional:      General: She is not in acute distress. HENT:     Head: Normocephalic and atraumatic.  Pulmonary:     Effort: Pulmonary effort is normal.   Neurological:     General: No focal deficit present.     Mental Status: She is alert.    Review of Systems  Constitutional:  Negative for fever.  Cardiovascular:  Negative for chest pain and palpitations.  Gastrointestinal:  Negative for constipation, diarrhea, nausea and vomiting.  Neurological:  Negative for dizziness, weakness and headaches.   Blood pressure 117/65, pulse 74, temperature 98.1 F (36.7 C), temperature source Oral, resp. rate 16, SpO2 97%. There is no height or weight on file to calculate BMI.  Treatment Plan Summary: Daily contact with patient to assess and evaluate symptoms and progress in treatment, Medication management, and Plan to establish patient with Medicaid, follow-up on APS  investigation of social care defines, and help patient with temporary and long-term helping.  Considering transfer to another unit to better assess patient's dependency with ADLs, incontinence, complex medical needs, and to provide more comfortable environment.   Patient is psychiatrically stable.   Justino [place], MD PGY-2 Psychiatry Resident 09/04/2023, 2:28 PM

## 2023-09-04 NOTE — ED Notes (Signed)
 Pt sitting up in bed quietly. Was given sandwich and drink.  No distress noted at this time.

## 2023-09-04 NOTE — ED Notes (Signed)
 Patient alert and oriented.  Denies SI, HI, and AVH. Tylenol  will be provided for reported headache 7/10. Scheduled medications administered to patient, per MD orders. Support and encouragement provided.  Routine safety checks conducted every hour.  Patient informed to notify staff with problems or concerns. Pt is placed in bed close to restroom and informed to notify staff if she feels dizzy or weak. No adverse drug reactions noted. Patient contracts for safety at this time. Patient compliant with medications and treatment plan. Patient receptive, calm, and cooperative. Patient interacts well with others on the unit.  Patient remains safe at this time.

## 2023-09-05 ENCOUNTER — Telehealth (HOSPITAL_BASED_OUTPATIENT_CLINIC_OR_DEPARTMENT_OTHER): Payer: Self-pay | Admitting: *Deleted

## 2023-09-05 DIAGNOSIS — Z794 Long term (current) use of insulin: Secondary | ICD-10-CM | POA: Diagnosis not present

## 2023-09-05 DIAGNOSIS — F329 Major depressive disorder, single episode, unspecified: Secondary | ICD-10-CM | POA: Diagnosis not present

## 2023-09-05 DIAGNOSIS — N183 Chronic kidney disease, stage 3 unspecified: Secondary | ICD-10-CM | POA: Diagnosis not present

## 2023-09-05 DIAGNOSIS — I2510 Atherosclerotic heart disease of native coronary artery without angina pectoris: Secondary | ICD-10-CM | POA: Diagnosis not present

## 2023-09-05 DIAGNOSIS — Z5902 Unsheltered homelessness: Secondary | ICD-10-CM | POA: Diagnosis not present

## 2023-09-05 DIAGNOSIS — I4891 Unspecified atrial fibrillation: Secondary | ICD-10-CM | POA: Diagnosis not present

## 2023-09-05 DIAGNOSIS — I129 Hypertensive chronic kidney disease with stage 1 through stage 4 chronic kidney disease, or unspecified chronic kidney disease: Secondary | ICD-10-CM | POA: Diagnosis not present

## 2023-09-05 DIAGNOSIS — N390 Urinary tract infection, site not specified: Secondary | ICD-10-CM | POA: Diagnosis not present

## 2023-09-05 DIAGNOSIS — Z7901 Long term (current) use of anticoagulants: Secondary | ICD-10-CM | POA: Diagnosis not present

## 2023-09-05 DIAGNOSIS — Z7984 Long term (current) use of oral hypoglycemic drugs: Secondary | ICD-10-CM | POA: Diagnosis not present

## 2023-09-05 DIAGNOSIS — Z951 Presence of aortocoronary bypass graft: Secondary | ICD-10-CM | POA: Diagnosis not present

## 2023-09-05 DIAGNOSIS — L80 Vitiligo: Secondary | ICD-10-CM | POA: Diagnosis not present

## 2023-09-05 DIAGNOSIS — Z7902 Long term (current) use of antithrombotics/antiplatelets: Secondary | ICD-10-CM | POA: Diagnosis not present

## 2023-09-05 DIAGNOSIS — Z91148 Patient's other noncompliance with medication regimen for other reason: Secondary | ICD-10-CM | POA: Diagnosis not present

## 2023-09-05 DIAGNOSIS — E1122 Type 2 diabetes mellitus with diabetic chronic kidney disease: Secondary | ICD-10-CM | POA: Diagnosis not present

## 2023-09-05 LAB — GLUCOSE, CAPILLARY
Glucose-Capillary: 285 mg/dL — ABNORMAL HIGH (ref 70–99)
Glucose-Capillary: 324 mg/dL — ABNORMAL HIGH (ref 70–99)
Glucose-Capillary: 158 mg/dL — ABNORMAL HIGH (ref 70–99)
Glucose-Capillary: 171 mg/dL — ABNORMAL HIGH (ref 70–99)

## 2023-09-05 MED ORDER — CEPHALEXIN 250 MG PO CAPS
250.0000 mg | ORAL_CAPSULE | Freq: Two times a day (BID) | ORAL | Status: DC
  Administered 2023-09-05 – 2023-09-06 (×3): 250 mg via ORAL
  Filled 2023-09-05 (×3): qty 1

## 2023-09-05 MED ORDER — LOPERAMIDE HCL 2 MG PO CAPS
2.0000 mg | ORAL_CAPSULE | ORAL | Status: DC | PRN
  Administered 2023-09-05: 2 mg via ORAL
  Filled 2023-09-05: qty 1

## 2023-09-05 MED ORDER — CEPHALEXIN 250 MG PO CAPS: 250.0000 mg | ORAL_CAPSULE | Freq: Two times a day (BID) | ORAL | Status: DC

## 2023-09-05 NOTE — ED Notes (Signed)
 Patient was toileted and was not incontinent.

## 2023-09-05 NOTE — ED Notes (Signed)
Pt resting quietly with eyes closed.  No pain or discomfort noted/voiced.  Breathing is even and unlabored.  Will continue to monitor for safety.  

## 2023-09-05 NOTE — ED Notes (Signed)
 Pt sleeping at this time. Rise and fall of chest noted. Pt in NAD at this time. Will continue to monitor.

## 2023-09-05 NOTE — ED Notes (Signed)
 Pt w/ incontinent episode requiring linen change. Pt being assisted w/ shower.

## 2023-09-05 NOTE — ED Notes (Signed)
 Patient currently sleeping and resting in bed. Pt observed/assessed in recliner sleeping. RR even and unlabored, appearing in no noted distress. Environmental check complete

## 2023-09-05 NOTE — ED Provider Notes (Addendum)
 Behavioral Health Progress Note  Date and Time: 09/05/2023 2:08 PM Name: Maria Fox MRN:  [id number]  HPI: Maria Fox is a 78 y.o. female w/ hx of MDD and T2DM who presents with suicidal thoughts in setting of experiencing homelessness and lacking access to her social security funds for which APS case is open. Patient is psychiatrically stable, pending safe discharge.   Subjective:   Patient reports that she is doing okay mentally. Denies significant depressive symptoms and denies having SI, HI, AVH. Reports that she has been sleeping okay despite environment. Reports that her apetite has been her usually and is eating okay.   Medically, patient reports that the pain in her abdomen is continued. Reports continued dysuria. Says that she thinks that she needs to have pelvic exam. Discussed that if we can get her accepted by ED today we will get her over there. She reports that she is not having any significant changes in her pain.   Addendum 6/27 1402 Spoke with [facility] pharmacist regarding urine culture and recommended d/c cefpodoxime  and starting cephalexin  250 every 12 hours for 14 doses. Patient is having some vague abdominal pain and diarrhea today, which I suggest is secondary to the antibiotic. Lower suspicion for ascending infection of urinary tract causing pain. No suspicion for sepsis. Discussed with [name] regarding talking to [facility] leadership about transfer to there facility for more comfortable environment for patient and given pt's medical issues, issues with ADLs, etc until she can get medicaid and secure transitional living or SNF. This would also allow for PT/OT evaluation and treatment.   Diagnosis:  Final diagnoses:  Suicidal ideation  Recurrent major depressive disorder, remission status unspecified ([name])  Ineffective coping  Urinary tract infection without hematuria, site unspecified  Dermatitis    Total Time spent with patient: 30 minutes  Past Psychiatric History:  MDD recurrent Past Medical History: Dependent in ADLs.  Urinary continence, T2DM, CAD status post CABG, chronic hatha, stage III CKD, A-fib; recent hospitalizations Family History: No pertinent Family Psychiatric  History: No pertinent Social History: Patient is currently experiencing homelessness and living in her car outside of her ex-partner's house.  Patient reports having still security checks which her ex-partner is utilizing and not providing the patient.  Patient does not endorse any significant substance use.   Sleep: Fair  Appetite:  Fair  Current Medications:  Current Facility-Administered Medications  Medication Dose Route Frequency Provider Last Rate Last Admin   acetaminophen  (TYLENOL ) tablet 650 mg  650 mg Oral Q6H PRN Trudy Carwin, NP   650 mg at 09/05/23 1350   alum & mag hydroxide-simeth (MAALOX/MYLANTA) 200-200-20 MG/5ML suspension 30 mL  30 mL Oral Q4H PRN Trudy Carwin, NP       apixaban  (ELIQUIS ) tablet 5 mg  5 mg Oral BID Trudy Carwin, NP   5 mg at 09/05/23 0901   ARIPiprazole  (ABILIFY ) tablet 2 mg  2 mg Oral Daily Trudy Carwin, NP   2 mg at 09/05/23 0901   atorvastatin  (LIPITOR) tablet 40 mg  40 mg Oral QPM Trudy Carwin, NP   40 mg at 09/04/23 1741   cephALEXin  (KEFLEX ) capsule 250 mg  250 mg Oral Q12H Karyn Brull, MD   250 mg at 09/05/23 1350   dorzolamide -timolol  (COSOPT ) 2-0.5 % ophthalmic solution 1 drop  1 drop Both Eyes BID Trudy Carwin, NP   1 drop at 09/05/23 0900   DULoxetine  (CYMBALTA ) DR capsule 60 mg  60 mg Oral QHS Trudy Carwin, NP  60 mg at 09/04/23 2128   feeding supplement (GLUCERNA SHAKE) (GLUCERNA SHAKE) liquid 237 mL  237 mL Oral TID BM Trudy Carwin, NP   237 mL at 09/05/23 1315   glipiZIDE  (GLUCOTROL ) tablet 10 mg  10 mg Oral BID Trudy Carwin, NP   10 mg at 09/05/23 0901   hydrOXYzine  (ATARAX ) tablet 10 mg  10 mg Oral TID PRN Arloa Suzen RAMAN, NP   10 mg at 09/03/23 9083   insulin  aspart (novoLOG ) injection 0-5 Units  0-5 Units  Subcutaneous QHS Arloa Suzen RAMAN, NP       insulin  aspart (novoLOG ) injection 0-9 Units  0-9 Units Subcutaneous TID WC Arloa Suzen RAMAN, NP   7 Units at 09/05/23 1119   isosorbide  mononitrate (IMDUR ) 24 hr tablet 15 mg  15 mg Oral Daily Trudy Carwin, NP   15 mg at 09/05/23 0902   latanoprost  (XALATAN ) 0.005 % ophthalmic solution 1 drop  1 drop Both Eyes QHS Trudy Carwin, NP   1 drop at 09/04/23 2127   loperamide  (IMODIUM ) capsule 2-4 mg  2-4 mg Oral PRN Adalberto Metzgar, MD   2 mg at 09/05/23 1356   magnesium  hydroxide (MILK OF MAGNESIA) suspension 30 mL  30 mL Oral Daily PRN Trudy Carwin, NP       metoprolol  tartrate (LOPRESSOR ) tablet 12.5 mg  12.5 mg Oral BID Arloa Suzen RAMAN, NP   12.5 mg at 09/05/23 0901   OLANZapine  (ZYPREXA ) injection 5 mg  5 mg Intramuscular TID PRN Trudy Carwin, NP       OLANZapine  zydis (ZYPREXA ) disintegrating tablet 5 mg  5 mg Oral TID PRN Trudy Carwin, NP       triamcinolone  (KENALOG ) 0.025 % cream   Topical TID PRN Arloa Suzen RAMAN, NP   Given at 09/03/23 0915   Current Outpatient Medications  Medication Sig Dispense Refill   acetaminophen  (TYLENOL ) 650 MG CR tablet Take 1,300 mg by mouth every 6 (six) hours as needed for pain.     albuterol  (PROVENTIL ) (2.5 MG/3ML) 0.083% nebulizer solution Inhale 3 mLs into the lungs every 6 (six) hours as needed for wheezing or shortness of breath. 75 mL 12   albuterol  (VENTOLIN  HFA) 108 (90 Base) MCG/ACT inhaler Inhale 1-2 puffs into the lungs every 6 (six) hours as needed for wheezing or shortness of breath. 6.7 g 3   cefpodoxime  (VANTIN ) 100 MG tablet Take 1 tablet (100 mg total) by mouth 2 (two) times daily for 7 days. 14 tablet 0   nitroGLYCERIN  (NITROSTAT ) 0.4 MG SL tablet Place 0.4 mg under the tongue every 5 (five) minutes as needed for chest pain.     apixaban  (ELIQUIS ) 5 MG TABS tablet Take 1 tablet (5 mg total) by mouth 2 (two) times daily. 60 tablet 6   ARIPiprazole  (ABILIFY ) 2 MG tablet Take 1 tablet (2 mg  total) by mouth daily. 60 tablet 0   atorvastatin  (LIPITOR) 40 MG tablet TAKE 1 TABLET BY MOUTH DAILY EACH EVENING 90 tablet 0   clopidogrel  (PLAVIX ) 75 MG tablet Take 1 tablet (75 mg total) by mouth daily. 90 tablet 4   dorzolamide -timolol  (COSOPT ) 2-0.5 % ophthalmic solution Place 1 drop into both eyes 2 (two) times daily.     DULoxetine  (CYMBALTA ) 60 MG capsule Take 1 capsule (60 mg total) by mouth at bedtime.     feeding supplement, GLUCERNA SHAKE, (GLUCERNA SHAKE) LIQD Take 237 mLs by mouth 3 (three) times daily between meals. (Patient taking differently: Take 237 mLs by mouth  in the morning and at bedtime.) 30 mL 0   glipiZIDE  (GLUCOTROL ) 10 MG tablet TAKE 1 TABLET BY MOUTH TWICE DAILY 60 tablet 3   hydrocortisone  cream 1 % Apply topically 3 (three) times daily. (Patient taking differently: Apply 1 Application topically 2 (two) times daily as needed for itching.) 30 g 0   isosorbide  mononitrate (IMDUR ) 30 MG 24 hr tablet Take 0.5 tablets (15 mg total) by mouth daily. 45 tablet 4   latanoprost  (XALATAN ) 0.005 % ophthalmic solution Place 1 drop into both eyes at bedtime. 2.5 mL 12   melatonin 5 MG TABS Take 5 mg by mouth at bedtime as needed (sleep).     metFORMIN  (GLUCOPHAGE -XR) 500 MG 24 hr tablet Take 1 tablet (500 mg total) by mouth daily with breakfast. 90 tablet 4   metoprolol  succinate (TOPROL -XL) 25 MG 24 hr tablet Take 1 tablet (25 mg total) by mouth daily. 90 tablet 4   pantoprazole  (PROTONIX ) 40 MG tablet TAKE ONE (1) TABLET BY MOUTH TWICE DAILY 60 tablet 3   QUEtiapine  (SEROQUEL ) 50 MG tablet Take 50 mg by mouth at bedtime.      Labs  Lab Results:  Admission on 09/02/2023  Component Date Value Ref Range Status   Glucose-Capillary 09/03/2023 336 (H)  70 - 99 mg/dL Final   Glucose reference range applies only to samples taken after fasting for at least 8 hours.   Glucose-Capillary 09/03/2023 321 (H)  70 - 99 mg/dL Final   Glucose reference range applies only to samples taken  after fasting for at least 8 hours.   Glucose-Capillary 09/03/2023 113 (H)  70 - 99 mg/dL Final   Glucose reference range applies only to samples taken after fasting for at least 8 hours.   Glucose-Capillary 09/03/2023 145 (H)  70 - 99 mg/dL Final   Glucose reference range applies only to samples taken after fasting for at least 8 hours.   Glucose-Capillary 09/04/2023 264 (H)  70 - 99 mg/dL Final   Glucose reference range applies only to samples taken after fasting for at least 8 hours.   Glucose-Capillary 09/04/2023 261 (H)  70 - 99 mg/dL Final   Glucose reference range applies only to samples taken after fasting for at least 8 hours.   Comment 1 09/04/2023 QC Due   Final   Glucose-Capillary 09/04/2023 123 (H)  70 - 99 mg/dL Final   Glucose reference range applies only to samples taken after fasting for at least 8 hours.   Glucose-Capillary 09/04/2023 176 (H)  70 - 99 mg/dL Final   Glucose reference range applies only to samples taken after fasting for at least 8 hours.   Glucose-Capillary 09/05/2023 285 (H)  70 - 99 mg/dL Final   Glucose reference range applies only to samples taken after fasting for at least 8 hours.   Glucose-Capillary 09/05/2023 324 (H)  70 - 99 mg/dL Final   Glucose reference range applies only to samples taken after fasting for at least 8 hours.  Admission on 09/02/2023, Discharged on 09/02/2023  Component Date Value Ref Range Status   Color, Urine 09/02/2023 YELLOW  YELLOW Final   APPearance 09/02/2023 CLOUDY (A)  CLEAR Final   Specific Gravity, Urine 09/02/2023 1.021  1.005 - 1.030 Final   pH 09/02/2023 5.0  5.0 - 8.0 Final   Glucose, UA 09/02/2023 >=500 (A)  NEGATIVE mg/dL Final   Hgb urine dipstick 09/02/2023 NEGATIVE  NEGATIVE Final   Bilirubin Urine 09/02/2023 NEGATIVE  NEGATIVE Final   Ketones,  ur 09/02/2023 NEGATIVE  NEGATIVE mg/dL Final   Protein, ur 93/75/7974 NEGATIVE  NEGATIVE mg/dL Final   Nitrite 93/75/7974 NEGATIVE  NEGATIVE Final   Leukocytes,Ua  09/02/2023 MODERATE (A)  NEGATIVE Final   RBC / HPF 09/02/2023 0-5  0 - 5 RBC/hpf Final   WBC, UA 09/02/2023 >50  0 - 5 WBC/hpf Final   Bacteria, UA 09/02/2023 RARE (A)  NONE SEEN Final   Squamous Epithelial / HPF 09/02/2023 0-5  0 - 5 /HPF Final   WBC Clumps 09/02/2023 PRESENT   Final   Mucus 09/02/2023 PRESENT   Final   Performed at [facility], [address], [name], [place] [address]   Glucose-Capillary 09/02/2023 343 (H)  70 - 99 mg/dL Final   Glucose reference range applies only to samples taken after fasting for at least 8 hours.   WBC 09/02/2023 9.6  4.0 - 10.5 K/uL Final   RBC 09/02/2023 4.13  3.87 - 5.11 MIL/uL Final   Hemoglobin 09/02/2023 12.0  12.0 - 15.0 g/dL Final   HCT 93/75/7974 36.6  36.0 - 46.0 % Final   MCV 09/02/2023 88.6  80.0 - 100.0 fL Final   MCH 09/02/2023 29.1  26.0 - 34.0 pg Final   MCHC 09/02/2023 32.8  30.0 - 36.0 g/dL Final   RDW 93/75/7974 14.2  11.5 - 15.5 % Final   Platelets 09/02/2023 269  150 - 400 K/uL Final   nRBC 09/02/2023 0.0  0.0 - 0.2 % Final   Neutrophils Relative % 09/02/2023 69  % Final   Neutro Abs 09/02/2023 6.7  1.7 - 7.7 K/uL Final   Lymphocytes Relative 09/02/2023 22  % Final   Lymphs Abs 09/02/2023 2.1  0.7 - 4.0 K/uL Final   Monocytes Relative 09/02/2023 6  % Final   Monocytes Absolute 09/02/2023 0.5  0.1 - 1.0 K/uL Final   Eosinophils Relative 09/02/2023 2  % Final   Eosinophils Absolute 09/02/2023 0.2  0.0 - 0.5 K/uL Final   Basophils Relative 09/02/2023 1  % Final   Basophils Absolute 09/02/2023 0.1  0.0 - 0.1 K/uL Final   Immature Granulocytes 09/02/2023 0  % Final   Abs Immature Granulocytes 09/02/2023 0.04  0.00 - 0.07 K/uL Final   Performed at [facility], [address], [place], [place] [address]   Opiates 09/02/2023 NONE DETECTED  NONE DETECTED Final   Cocaine 09/02/2023 NONE DETECTED  NONE DETECTED Final   Benzodiazepines 09/02/2023 NONE DETECTED  NONE DETECTED Final   Amphetamines 09/02/2023 NONE  DETECTED  NONE DETECTED Final   Tetrahydrocannabinol 09/02/2023 NONE DETECTED  NONE DETECTED Final   Barbiturates 09/02/2023 NONE DETECTED  NONE DETECTED Final   Comment: (NOTE) DRUG SCREEN FOR MEDICAL PURPOSES ONLY.  IF CONFIRMATION IS NEEDED FOR ANY PURPOSE, NOTIFY LAB WITHIN 5 DAYS.  LOWEST DETECTABLE LIMITS FOR URINE DRUG SCREEN Drug Class                     Cutoff (ng/mL) Amphetamine and metabolites    1000 Barbiturate and metabolites    200 Benzodiazepine                 200 Opiates and metabolites        300 Cocaine and metabolites        300 THC                            50 Performed at  [facility], [address], [place], [place] [address]    Sodium 09/02/2023 134 (L)  135 - 145 mmol/L Final   Potassium 09/02/2023 3.9  3.5 - 5.1 mmol/L Final   Chloride 09/02/2023 105  98 - 111 mmol/L Final   CO2 09/02/2023 17 (L)  22 - 32 mmol/L Final   Glucose, Bld 09/02/2023 352 (H)  70 - 99 mg/dL Final   Glucose reference range applies only to samples taken after fasting for at least 8 hours.   BUN 09/02/2023 20  8 - 23 mg/dL Final   Creatinine, Ser 09/02/2023 1.75 (H)  0.44 - 1.00 mg/dL Final   Calcium  09/02/2023 9.2  8.9 - 10.3 mg/dL Final   Total Protein 93/75/7974 7.1  6.5 - 8.1 g/dL Final   Albumin 93/75/7974 3.7  3.5 - 5.0 g/dL Final   AST 93/75/7974 20  15 - 41 U/L Final   ALT 09/02/2023 12  0 - 44 U/L Final   Alkaline Phosphatase 09/02/2023 70  38 - 126 U/L Final   Total Bilirubin 09/02/2023 0.7  0.0 - 1.2 mg/dL Final   GFR, Estimated 09/02/2023 29 (L)  >60 mL/min Final   Comment: (NOTE) Calculated using the CKD-EPI Creatinine Equation (2021)    Anion gap 09/02/2023 12  5 - 15 Final   Performed at [facility], [address] [place], [place] [address]   Acetaminophen  (Tylenol ), Serum 09/02/2023 <10 (L)  10 - 30 ug/mL Final   Comment: (NOTE) Therapeutic concentrations vary significantly. A range of 10-30 ug/mL  may be an effective concentration for  many patients. However, some  are best treated at concentrations outside of this range. Acetaminophen  concentrations >150 ug/mL at 4 hours after ingestion  and >50 ug/mL at 12 hours after ingestion are often associated with  toxic reactions.  Performed at [facility], [address] [place], [place] [address]    Salicylate Lvl 09/02/2023 <7.0 (L)  7.0 - 30.0 mg/dL Final   Performed at [facility], [address], [place], [place] [address]   Alcohol, Ethyl (B) 09/02/2023 <15  <15 mg/dL Final   Comment: (NOTE) For medical purposes only. Performed at [facility], [address] [place], [place] [address]    Glucose-Capillary 09/02/2023 240 (H)  70 - 99 mg/dL Final   Glucose reference range applies only to samples taken after fasting for at least 8 hours.   Glucose-Capillary 09/02/2023 207 (H)  70 - 99 mg/dL Final   Glucose reference range applies only to samples taken after fasting for at least 8 hours.  Admission on 09/02/2023, Discharged on 09/02/2023  Component Date Value Ref Range Status   Glucose-Capillary 09/02/2023 391 (H)  70 - 99 mg/dL Final   Glucose reference range applies only to samples taken after fasting for at least 8 hours.  Admission on 09/01/2023, Discharged on 09/02/2023  Component Date Value Ref Range Status   Glucose-Capillary 09/01/2023 385 (H)  70 - 99 mg/dL Final   Glucose reference range applies only to samples taken after fasting for at least 8 hours.   Sodium 09/01/2023 128 (L)  135 - 145 mmol/L Final   Potassium 09/01/2023 4.0  3.5 - 5.1 mmol/L Final   Chloride 09/01/2023 95 (L)  98 - 111 mmol/L Final   CO2 09/01/2023 16 (L)  22 - 32 mmol/L Final   Glucose, Bld 09/01/2023 351 (H)  70 - 99 mg/dL Final   Glucose reference range  applies only to samples taken after fasting for at least 8 hours.   BUN 09/01/2023 20  8 - 23 mg/dL Final   Creatinine, Ser 09/01/2023 1.78 (H)  0.44 - 1.00 mg/dL Final   Calcium  09/01/2023 9.3  8.9 - 10.3  mg/dL Final   Total Protein 93/76/7974 7.3  6.5 - 8.1 g/dL Final   Albumin 93/76/7974 4.1  3.5 - 5.0 g/dL Final   AST 93/76/7974 20  15 - 41 U/L Final   ALT 09/01/2023 12  0 - 44 U/L Final   Alkaline Phosphatase 09/01/2023 82  38 - 126 U/L Final   Total Bilirubin 09/01/2023 0.9  0.0 - 1.2 mg/dL Final   GFR, Estimated 09/01/2023 29 (L)  >60 mL/min Final   Comment: (NOTE) Calculated using the CKD-EPI Creatinine Equation (2021)    Anion gap 09/01/2023 17 (H)  5 - 15 Final   Performed at [facility], [address], [place], [place] [address]   Color, Urine 09/01/2023 YELLOW  YELLOW Final   APPearance 09/01/2023 CLOUDY (A)  CLEAR Final   Specific Gravity, Urine 09/01/2023 1.005  1.005 - 1.030 Final   pH 09/01/2023 5.0  5.0 - 8.0 Final   Glucose, UA 09/01/2023 150 (A)  NEGATIVE mg/dL Final   Hgb urine dipstick 09/01/2023 NEGATIVE  NEGATIVE Final   Bilirubin Urine 09/01/2023 NEGATIVE  NEGATIVE Final   Ketones, ur 09/01/2023 NEGATIVE  NEGATIVE mg/dL Final   Protein, ur 93/76/7974 NEGATIVE  NEGATIVE mg/dL Final   Nitrite 93/76/7974 NEGATIVE  NEGATIVE Final   Leukocytes,Ua 09/01/2023 LARGE (A)  NEGATIVE Final   RBC / HPF 09/01/2023 6-10  0 - 5 RBC/hpf Final   WBC, UA 09/01/2023 >50  0 - 5 WBC/hpf Final   Bacteria, UA 09/01/2023 MANY (A)  NONE SEEN Final   Squamous Epithelial / HPF 09/01/2023 0-5  0 - 5 /HPF Final   WBC Clumps 09/01/2023 PRESENT   Final   Mucus 09/01/2023 PRESENT   Final   Performed at [facility], [address] [place], [place] [address]   WBC 09/01/2023 7.9  4.0 - 10.5 K/uL Final   RBC 09/01/2023 4.34  3.87 - 5.11 MIL/uL Final   Hemoglobin 09/01/2023 12.6  12.0 - 15.0 g/dL Final   HCT 93/76/7974 38.9  36.0 - 46.0 % Final   MCV 09/01/2023 89.6  80.0 - 100.0 fL Final   MCH 09/01/2023 29.0  26.0 - 34.0 pg Final   MCHC 09/01/2023 32.4  30.0 - 36.0 g/dL Final   RDW 93/76/7974 14.1  11.5 - 15.5 % Final   Platelets 09/01/2023 268  150  - 400 K/uL Final   nRBC 09/01/2023 0.0  0.0 - 0.2 % Final   Neutrophils Relative % 09/01/2023 62  % Final   Neutro Abs 09/01/2023 4.9  1.7 - 7.7 K/uL Final   Lymphocytes Relative 09/01/2023 27  % Final   Lymphs Abs 09/01/2023 2.1  0.7 - 4.0 K/uL Final   Monocytes Relative 09/01/2023 8  % Final   Monocytes Absolute 09/01/2023 0.6  0.1 - 1.0 K/uL Final   Eosinophils Relative 09/01/2023 2  % Final   Eosinophils Absolute 09/01/2023 0.2  0.0 - 0.5 K/uL Final   Basophils Relative 09/01/2023 1  % Final   Basophils Absolute 09/01/2023 0.1  0.0 - 0.1 K/uL Final   Immature Granulocytes 09/01/2023 0  % Final   Abs Immature Granulocytes 09/01/2023 0.03  0.00 - 0.07 K/uL Final   Performed at [facility], [address], [place], [place] [address]   Troponin I (High Sensitivity) 09/01/2023 16  <18 ng/L Final   Comment: (NOTE) Elevated high sensitivity troponin I (hsTnI) values and significant  changes across serial measurements may suggest ACS but many other  chronic and acute conditions are known to elevate hsTnI results.  Refer to the Links section for chest pain algorithms and additional  guidance. Performed at [facility], [address], [place], [place] [address]    Total CK 09/01/2023 63  38 - 234 U/L Final   Performed at [facility], [address], [place], [place] [address]   Troponin I (High Sensitivity) 09/01/2023 17  <18 ng/L Final   Comment: (NOTE) Elevated high sensitivity troponin I (hsTnI) values and significant  changes across serial measurements may suggest ACS but many other  chronic and acute conditions are known to elevate hsTnI results.  Refer to the Links section for chest pain algorithms and additional  guidance. Performed at [facility], [address], [place], [place] [address]    Specimen Description 09/01/2023    Final                   Value:URINE, CLEAN CATCH Performed at  [facility], [address] [place] [address] [place], [place] [address]    Special Requests 09/01/2023    Final                   Value:NONE Performed at [facility], [address] [place], [place] [address]    Culture 09/01/2023 >=100,000 COLONIES/mL KLEBSIELLA OXYTOCA (A)   Final   Report Status 09/01/2023 09/04/2023 FINAL   Final   Organism ID, Bacteria 09/01/2023 KLEBSIELLA OXYTOCA (A)   Final  No results displayed because visit has over 200 results.    Admission on 05/28/2023, Discharged on 06/06/2023  Component Date Value Ref Range Status   Sodium 05/28/2023 134 (L)  135 - 145 mmol/L Final   Potassium 05/28/2023 3.6  3.5 - 5.1 mmol/L Final   Chloride 05/28/2023 107  98 - 111 mmol/L Final   CO2 05/28/2023 18 (L)  22 - 32 mmol/L Final   Glucose, Bld 05/28/2023 294 (H)  70 - 99 mg/dL Final   Glucose reference range applies only to samples taken after fasting for at least 8 hours.   BUN 05/28/2023 21  8 - 23 mg/dL Final   Creatinine, Ser 05/28/2023 1.50 (H)  0.44 - 1.00 mg/dL Final   Calcium  05/28/2023 8.6 (L)  8.9 - 10.3 mg/dL Final   Total Protein 96/80/7974 7.1  6.5 - 8.1 g/dL Final   Albumin 96/80/7974 3.7  3.5 - 5.0 g/dL Final   AST 96/80/7974 15  15 - 41 U/L Final   ALT 05/28/2023 12  0 - 44 U/L Final   Alkaline Phosphatase 05/28/2023 71  38 - 126 U/L Final   Total Bilirubin 05/28/2023 0.9  0.0 - 1.2 mg/dL Final   GFR, Estimated 05/28/2023 35 (L)  >60 mL/min Final   Comment: (NOTE) Calculated using the CKD-EPI Creatinine Equation (2021)    Anion gap 05/28/2023 9  5 - 15 Final   Performed at [facility], [address] [place], [place] [address]   Alcohol, Ethyl (B) 05/28/2023 <10  <10 mg/dL Final   Comment: (NOTE) Lowest detectable limit for serum alcohol is 10 mg/dL.  For medical purposes only. Performed at [facility], [address] [place], [place] [address]    Salicylate Lvl 05/28/2023 <7.0 (L)   7.0 - 30.0 mg/dL Final   Performed at [facility], [address] [place], [place] [address]   Acetaminophen  (Tylenol ), Serum 05/28/2023 <10 (L)  10 - 30 ug/mL Final   Comment: (NOTE) Therapeutic concentrations vary significantly. A range of 10-30 ug/mL  may be an effective concentration for many patients. However, some  are best treated at concentrations outside of this range. Acetaminophen  concentrations >150 ug/mL at 4 hours after ingestion  and >50 ug/mL at 12 hours after ingestion are often associated with  toxic reactions.  Performed at [facility], [address] [place], [place] [address]    WBC 05/28/2023 8.8  4.0 - 10.5 K/uL Final   RBC 05/28/2023 4.06  3.87 - 5.11 MIL/uL Final   Hemoglobin 05/28/2023 11.6 (L)  12.0 - 15.0 g/dL Final   HCT 96/80/7974 36.3  36.0 - 46.0 % Final   MCV 05/28/2023 89.4  80.0 - 100.0 fL Final   MCH 05/28/2023 28.6  26.0 - 34.0 pg Final   MCHC 05/28/2023 32.0  30.0 - 36.0 g/dL Final   RDW 96/80/7974 14.1  11.5 - 15.5 % Final   Platelets 05/28/2023 233  150 - 400 K/uL Final   nRBC 05/28/2023 0.0  0.0 - 0.2 % Final   Performed at [facility], [address], [place], [place] [address]   Opiates 05/28/2023 NONE DETECTED  NONE DETECTED Final   Cocaine 05/28/2023 NONE DETECTED  NONE DETECTED Final   Benzodiazepines 05/28/2023 NONE DETECTED  NONE DETECTED Final   Amphetamines 05/28/2023 NONE DETECTED  NONE DETECTED Final   Tetrahydrocannabinol 05/28/2023 NONE DETECTED  NONE DETECTED Final   Barbiturates 05/28/2023 NONE DETECTED  NONE DETECTED Final   Comment: (NOTE) DRUG SCREEN FOR MEDICAL PURPOSES ONLY.  IF CONFIRMATION IS NEEDED FOR ANY PURPOSE, NOTIFY LAB WITHIN 5 DAYS.  LOWEST DETECTABLE LIMITS FOR URINE DRUG SCREEN Drug Class                     Cutoff (ng/mL) Amphetamine and metabolites    1000 Barbiturate and metabolites    200 Benzodiazepine                 200 Opiates and  metabolites        300 Cocaine and metabolites        300 THC                            50 Performed at [facility], [address], [place], [place] [address]    Color, Urine 05/28/2023 YELLOW  YELLOW Final   APPearance 05/28/2023 CLEAR  CLEAR Final   Specific Gravity, Urine 05/28/2023 1.015  1.005 - 1.030 Final   pH 05/28/2023 5.0  5.0 - 8.0 Final   Glucose, UA 05/28/2023 NEGATIVE  NEGATIVE mg/dL Final   Hgb urine dipstick 05/28/2023 NEGATIVE  NEGATIVE Final   Bilirubin Urine 05/28/2023 NEGATIVE  NEGATIVE Final   Ketones, ur 05/28/2023 NEGATIVE  NEGATIVE mg/dL Final   Protein, ur 96/80/7974 NEGATIVE  NEGATIVE mg/dL Final   Nitrite 96/80/7974 NEGATIVE  NEGATIVE Final   Leukocytes,Ua 05/28/2023 MODERATE (A)  NEGATIVE Final   RBC / HPF 05/28/2023 0-5  0 - 5 RBC/hpf Final   WBC, UA 05/28/2023 11-20  0 - 5 WBC/hpf Final   Bacteria, UA 05/28/2023 RARE (A)  NONE SEEN Final   Squamous Epithelial / HPF 05/28/2023 0-5  0 - 5 /HPF Final   Mucus 05/28/2023 PRESENT   Final   Hyaline Casts, UA 05/28/2023 PRESENT   Final   Performed at [facility], [address] [place], [place] [address]   Glucose-Capillary 05/28/2023 183 (H)  70 - 99 mg/dL Final   Glucose reference range applies only to samples taken after fasting for at least 8 hours.   Glucose-Capillary 05/29/2023 159 (H)  70 - 99 mg/dL Final   Glucose reference range applies only to samples taken after fasting for at least 8 hours.   Glucose-Capillary 05/31/2023 190 (H)  70 - 99 mg/dL Final   Glucose reference range applies only to samples taken after fasting for at least 8 hours.   Comment 1 05/31/2023 Notify RN   Final   Glucose-Capillary 05/31/2023 255 (H)  70 - 99 mg/dL Final   Glucose reference range applies only to samples taken after fasting for at least 8 hours.   Comment 1 05/31/2023 Notify RN   Final   Glucose-Capillary 05/31/2023 187 (H)  70 - 99 mg/dL Final   Glucose reference range  applies only to samples taken after fasting for at least 8 hours.   Comment 1 05/31/2023 Notify RN   Final  Admission on 05/15/2023, Discharged on 05/23/2023  Component Date Value Ref Range Status   Sodium 05/15/2023 135  135 - 145 mmol/L Final   Potassium 05/15/2023 4.1  3.5 - 5.1 mmol/L Final   Chloride 05/15/2023 105  98 - 111 mmol/L Final   CO2 05/15/2023 19 (L)  22 - 32 mmol/L Final   Glucose, Bld 05/15/2023 295 (H)  70 - 99 mg/dL Final   Glucose reference range applies only to samples taken after fasting for at least 8 hours.   BUN 05/15/2023 17  8 - 23 mg/dL Final   Creatinine, Ser 05/15/2023 1.25 (H)  0.44 - 1.00 mg/dL Final   Calcium  05/15/2023 9.4  8.9 - 10.3 mg/dL Final   Total Protein 96/93/7974 7.7  6.5 - 8.1 g/dL Final   Albumin 96/93/7974 4.1  3.5 - 5.0 g/dL Final   AST 96/93/7974 19  15 - 41 U/L Final   ALT 05/15/2023 15  0 - 44 U/L Final   Alkaline Phosphatase 05/15/2023 85  38 - 126 U/L Final   Total Bilirubin 05/15/2023 0.7  0.0 - 1.2 mg/dL Final   GFR, Estimated 05/15/2023 44 (L)  >60 mL/min Final   Comment: (NOTE) Calculated using the CKD-EPI Creatinine Equation (2021)    Anion gap 05/15/2023 11  5 - 15 Final   Performed at [facility], [address] [place], [place] [address]   Alcohol, Ethyl (B) 05/15/2023 <10  <10 mg/dL Final   Comment: (NOTE) Lowest detectable limit for serum alcohol is 10 mg/dL.  For medical purposes only. Performed at [facility], [address], [place], [place] [address]    Salicylate Lvl 05/15/2023 <7.0 (L)  7.0 - 30.0 mg/dL Final   Performed at [facility], [address], [place], [place] [address]   Acetaminophen  (Tylenol ), Serum 05/15/2023 <10 (L)  10 - 30 ug/mL Final   Comment: (NOTE) Therapeutic concentrations vary significantly. A range of 10-30 ug/mL  may be an effective concentration for many patients. However, some  are best treated at concentrations outside of  this range. Acetaminophen  concentrations >150 ug/mL at 4 hours after ingestion  and >50  ug/mL at 12 hours after ingestion are often associated with  toxic reactions.  Performed at [facility], [address] [place], [place] [address]    WBC 05/15/2023 8.6  4.0 - 10.5 K/uL Final   RBC 05/15/2023 4.27  3.87 - 5.11 MIL/uL Final   Hemoglobin 05/15/2023 12.3  12.0 - 15.0 g/dL Final   HCT 96/93/7974 37.4  36.0 - 46.0 % Final   MCV 05/15/2023 87.6  80.0 - 100.0 fL Final   MCH 05/15/2023 28.8  26.0 - 34.0 pg Final   MCHC 05/15/2023 32.9  30.0 - 36.0 g/dL Final   RDW 96/93/7974 13.6  11.5 - 15.5 % Final   Platelets 05/15/2023 290  150 - 400 K/uL Final   nRBC 05/15/2023 0.0  0.0 - 0.2 % Final   Performed at [facility], [address] [place], [place] [address]   Opiates 05/15/2023 NONE DETECTED  NONE DETECTED Final   Cocaine 05/15/2023 NONE DETECTED  NONE DETECTED Final   Benzodiazepines 05/15/2023 NONE DETECTED  NONE DETECTED Final   Amphetamines 05/15/2023 NONE DETECTED  NONE DETECTED Final   Tetrahydrocannabinol 05/15/2023 NONE DETECTED  NONE DETECTED Final   Barbiturates 05/15/2023 NONE DETECTED  NONE DETECTED Final   Comment: (NOTE) DRUG SCREEN FOR MEDICAL PURPOSES ONLY.  IF CONFIRMATION IS NEEDED FOR ANY PURPOSE, NOTIFY LAB WITHIN 5 DAYS.  LOWEST DETECTABLE LIMITS FOR URINE DRUG SCREEN Drug Class                     Cutoff (ng/mL) Amphetamine and metabolites    1000 Barbiturate and metabolites    200 Benzodiazepine                 200 Opiates and metabolites        300 Cocaine and metabolites        300 THC                            50 Performed at [facility], [address] [place], [place] [address]    Glucose-Capillary 05/15/2023 309 (H)  70 - 99 mg/dL Final   Glucose reference range applies only to samples taken after fasting for at least 8 hours.   Glucose-Capillary 05/16/2023 282 (H)  70 - 99 mg/dL Final    Glucose reference range applies only to samples taken after fasting for at least 8 hours.   Comment 1 05/16/2023 Notify RN   Final   Glucose-Capillary 05/16/2023 345 (H)  70 - 99 mg/dL Final   Glucose reference range applies only to samples taken after fasting for at least 8 hours.   Comment 1 05/16/2023 Notify RN   Final   Glucose-Capillary 05/16/2023 271 (H)  70 - 99 mg/dL Final   Glucose reference range applies only to samples taken after fasting for at least 8 hours.   Comment 1 05/16/2023 Notify RN   Final   Glucose-Capillary 05/17/2023 111 (H)  70 - 99 mg/dL Final   Glucose reference range applies only to samples taken after fasting for at least 8 hours.   Glucose-Capillary 05/17/2023 252 (H)  70 - 99 mg/dL Final   Glucose reference range applies only to samples taken after fasting for at least 8 hours.   Glucose-Capillary 05/17/2023 246 (H)  70 - 99 mg/dL Final   Glucose reference range applies only to samples taken after fasting for at least 8 hours.  Color, Urine 05/17/2023 YELLOW  YELLOW Final   APPearance 05/17/2023 CLOUDY (A)  CLEAR Final   Specific Gravity, Urine 05/17/2023 1.016  1.005 - 1.030 Final   pH 05/17/2023 5.0  5.0 - 8.0 Final   Glucose, UA 05/17/2023 NEGATIVE  NEGATIVE mg/dL Final   Hgb urine dipstick 05/17/2023 NEGATIVE  NEGATIVE Final   Bilirubin Urine 05/17/2023 NEGATIVE  NEGATIVE Final   Ketones, ur 05/17/2023 NEGATIVE  NEGATIVE mg/dL Final   Protein, ur 96/91/7974 30 (A)  NEGATIVE mg/dL Final   Nitrite 96/91/7974 NEGATIVE  NEGATIVE Final   Leukocytes,Ua 05/17/2023 LARGE (A)  NEGATIVE Final   RBC / HPF 05/17/2023 21-50  0 - 5 RBC/hpf Final   WBC, UA 05/17/2023 >50  0 - 5 WBC/hpf Final   Bacteria, UA 05/17/2023 MANY (A)  NONE SEEN Final   Squamous Epithelial / HPF 05/17/2023 0-5  0 - 5 /HPF Final   WBC Clumps 05/17/2023 PRESENT   Final   Performed at [facility], [address], [place], [place] [address]   Specimen Description  05/17/2023    Final                   Value:URINE, CLEAN CATCH Performed at [facility], [address], [place], [place] [address]    Special Requests 05/17/2023    Final                   Value:NONE Performed at [facility], [address], [place], [place] [address]    Culture 05/17/2023 >=100,000 COLONIES/mL CITROBACTER FREUNDII (A)   Final   Report Status 05/17/2023 05/19/2023 FINAL   Final   Organism ID, Bacteria 05/17/2023 CITROBACTER FREUNDII (A)   Final   Glucose-Capillary 05/17/2023 94  70 - 99 mg/dL Final   Glucose reference range applies only to samples taken after fasting for at least 8 hours.   Glucose-Capillary 05/18/2023 105 (H)  70 - 99 mg/dL Final   Glucose reference range applies only to samples taken after fasting for at least 8 hours.   Glucose-Capillary 05/18/2023 153 (H)  70 - 99 mg/dL Final   Glucose reference range applies only to samples taken after fasting for at least 8 hours.   Glucose-Capillary 05/19/2023 65 (L)  70 - 99 mg/dL Final   Glucose reference range applies only to samples taken after fasting for at least 8 hours.   Glucose-Capillary 05/19/2023 100 (H)  70 - 99 mg/dL Final   Glucose reference range applies only to samples taken after fasting for at least 8 hours.   Glucose-Capillary 05/19/2023 145 (H)  70 - 99 mg/dL Final   Glucose reference range applies only to samples taken after fasting for at least 8 hours.   Comment 1 05/19/2023 Notify RN   Final   Glucose-Capillary 05/19/2023 214 (H)  70 - 99 mg/dL Final   Glucose reference range applies only to samples taken after fasting for at least 8 hours.   Comment 1 05/19/2023 Notify RN   Final   Glucose-Capillary 05/19/2023 158 (H)  70 - 99 mg/dL Final   Glucose reference range applies only to samples taken after fasting for at least 8 hours.   Glucose-Capillary 05/20/2023 185 (H)  70 - 99 mg/dL Final   Glucose reference range applies only to samples taken  after fasting for at least 8 hours.   Glucose-Capillary 05/20/2023 144 (H)  70 - 99 mg/dL Final   Glucose reference range applies only  to samples taken after fasting for at least 8 hours.   Glucose-Capillary 05/21/2023 228 (H)  70 - 99 mg/dL Final   Glucose reference range applies only to samples taken after fasting for at least 8 hours.   Comment 1 05/21/2023 Notify RN   Final   Glucose-Capillary 05/22/2023 166 (H)  70 - 99 mg/dL Final   Glucose reference range applies only to samples taken after fasting for at least 8 hours.   Comment 1 05/22/2023 Notify RN   Final   Glucose-Capillary 05/22/2023 166 (H)  70 - 99 mg/dL Final   Glucose reference range applies only to samples taken after fasting for at least 8 hours.   Comment 1 05/22/2023 Notify RN   Final   Glucose-Capillary 05/22/2023 186 (H)  70 - 99 mg/dL Final   Glucose reference range applies only to samples taken after fasting for at least 8 hours.  Office Visit on 05/14/2023  Component Date Value Ref Range Status   Color, UA 05/14/2023 yellow   Final   Clarity, UA 05/14/2023 cloudy   Final   Glucose, UA 05/14/2023 Negative  Negative Final   Bilirubin, UA 05/14/2023 negative   Final   Ketones, UA 05/14/2023 negative   Final   Spec Grav, UA 05/14/2023 1.020  1.010 - 1.025 Final   Blood, UA 05/14/2023 negative   Final   pH, UA 05/14/2023 5.5  5.0 - 8.0 Final   Protein, UA 05/14/2023 Negative  Negative Final   Urobilinogen, UA 05/14/2023 0.2  0.2 or 1.0 E.U./dL Final   Nitrite, UA 96/94/7974 negative   Final   Leukocytes, UA 05/14/2023 Small (1+) (A)  Negative Final   70 Leu/uL   MICRO NUMBER: 05/14/2023 [id number]   Final   SPECIMEN QUALITY: 05/14/2023 Adequate   Final   Sample Source 05/14/2023 URINE, CLEAN CATCH   Final   STATUS: 05/14/2023 FINAL   Final   Result: 05/14/2023    Final                   Value:Mixed genital flora isolated. These superficial bacteria are not indicative of a urinary tract infection. No further  organism identification is warranted on this specimen. If clinically indicated, recollect clean-catch, mid-stream urine and transfer  immediately to Urine Culture Transport Tube.   Admission on 05/09/2023, Discharged on 05/12/2023  Component Date Value Ref Range Status   Glucose-Capillary 05/09/2023 174 (H)  70 - 99 mg/dL Final   Glucose reference range applies only to samples taken after fasting for at least 8 hours.   Opiates 05/10/2023 NONE DETECTED  NONE DETECTED Final   Cocaine 05/10/2023 NONE DETECTED  NONE DETECTED Final   Benzodiazepines 05/10/2023 NONE DETECTED  NONE DETECTED Final   Amphetamines 05/10/2023 NONE DETECTED  NONE DETECTED Final   Tetrahydrocannabinol 05/10/2023 NONE DETECTED  NONE DETECTED Final   Barbiturates 05/10/2023 NONE DETECTED  NONE DETECTED Final   Comment: (NOTE) DRUG SCREEN FOR MEDICAL PURPOSES ONLY.  IF CONFIRMATION IS NEEDED FOR ANY PURPOSE, NOTIFY LAB WITHIN 5 DAYS.  LOWEST DETECTABLE LIMITS FOR URINE DRUG SCREEN Drug Class                     Cutoff (ng/mL) Amphetamine and metabolites    1000 Barbiturate and metabolites    200 Benzodiazepine                 200 Opiates and metabolites        300 Cocaine and metabolites  300 THC                            50 Performed at [facility], [address] [place], [place] [address]    Creatinine, Ser 05/10/2023 1.68 (H)  0.44 - 1.00 mg/dL Final   GFR, Estimated 05/10/2023 31 (L)  >60 mL/min Final   Comment: (NOTE) Calculated using the CKD-EPI Creatinine Equation (2021) Performed at [facility], [address] [place], [place] [address]   Admission on 05/06/2023, Discharged on 05/07/2023  Component Date Value Ref Range Status   WBC 05/06/2023 9.9  4.0 - 10.5 K/uL Final   RBC 05/06/2023 4.16  3.87 - 5.11 MIL/uL Final   Hemoglobin 05/06/2023 11.9 (L)  12.0 - 15.0 g/dL Final   HCT 97/74/7974 38.1  36.0 - 46.0 % Final   MCV 05/06/2023 91.6  80.0 -  100.0 fL Final   MCH 05/06/2023 28.6  26.0 - 34.0 pg Final   MCHC 05/06/2023 31.2  30.0 - 36.0 g/dL Final   RDW 97/74/7974 13.4  11.5 - 15.5 % Final   Platelets 05/06/2023 275  150 - 400 K/uL Final   nRBC 05/06/2023 0.0  0.0 - 0.2 % Final   Neutrophils Relative % 05/06/2023 59  % Final   Neutro Abs 05/06/2023 5.9  1.7 - 7.7 K/uL Final   Lymphocytes Relative 05/06/2023 28  % Final   Lymphs Abs 05/06/2023 2.7  0.7 - 4.0 K/uL Final   Monocytes Relative 05/06/2023 8  % Final   Monocytes Absolute 05/06/2023 0.8  0.1 - 1.0 K/uL Final   Eosinophils Relative 05/06/2023 4  % Final   Eosinophils Absolute 05/06/2023 0.4  0.0 - 0.5 K/uL Final   Basophils Relative 05/06/2023 1  % Final   Basophils Absolute 05/06/2023 0.1  0.0 - 0.1 K/uL Final   Immature Granulocytes 05/06/2023 0  % Final   Abs Immature Granulocytes 05/06/2023 0.04  0.00 - 0.07 K/uL Final   Performed at [facility], [address] [place], [place] [address]   Sodium 05/06/2023 135  135 - 145 mmol/L Final   Potassium 05/06/2023 3.9  3.5 - 5.1 mmol/L Final   Chloride 05/06/2023 105  98 - 111 mmol/L Final   CO2 05/06/2023 19 (L)  22 - 32 mmol/L Final   Glucose, Bld 05/06/2023 299 (H)  70 - 99 mg/dL Final   Glucose reference range applies only to samples taken after fasting for at least 8 hours.   BUN 05/06/2023 26 (H)  8 - 23 mg/dL Final   Creatinine, Ser 05/06/2023 1.56 (H)  0.44 - 1.00 mg/dL Final   Calcium  05/06/2023 9.1  8.9 - 10.3 mg/dL Final   Total Protein 97/74/7974 7.4  6.5 - 8.1 g/dL Final   Albumin 97/74/7974 4.0  3.5 - 5.0 g/dL Final   AST 97/74/7974 13 (L)  15 - 41 U/L Final   ALT 05/06/2023 13  0 - 44 U/L Final   Alkaline Phosphatase 05/06/2023 92  38 - 126 U/L Final   Total Bilirubin 05/06/2023 0.7  0.0 - 1.2 mg/dL Final   GFR, Estimated 05/06/2023 33 (L)  >60 mL/min Final   Comment: (NOTE) Calculated using the CKD-EPI Creatinine Equation (2021)    Anion gap 05/06/2023 11  5 - 15 Final    Performed at [facility], [address] [place], [place] [address]   Color, Urine 05/06/2023 YELLOW  YELLOW Final   APPearance 05/06/2023 CLOUDY (A)  CLEAR Final   Specific Gravity, Urine 05/06/2023 1.018  1.005 - 1.030 Final   pH 05/06/2023 5.0  5.0 - 8.0 Final   Glucose, UA 05/06/2023 50 (A)  NEGATIVE mg/dL Final   Hgb urine dipstick 05/06/2023 NEGATIVE  NEGATIVE Final   Bilirubin Urine 05/06/2023 NEGATIVE  NEGATIVE Final   Ketones, ur 05/06/2023 NEGATIVE  NEGATIVE mg/dL Final   Protein, ur 97/74/7974 NEGATIVE  NEGATIVE mg/dL Final   Nitrite 97/74/7974 POSITIVE (A)  NEGATIVE Final   Leukocytes,Ua 05/06/2023 LARGE (A)  NEGATIVE Final   RBC / HPF 05/06/2023 6-10  0 - 5 RBC/hpf Final   WBC, UA 05/06/2023 >50  0 - 5 WBC/hpf Final   Bacteria, UA 05/06/2023 MANY (A)  NONE SEEN Final   Squamous Epithelial / HPF 05/06/2023 0-5  0 - 5 /HPF Final   WBC Clumps 05/06/2023 PRESENT   Final   Mucus 05/06/2023 PRESENT   Final   Hyaline Casts, UA 05/06/2023 PRESENT   Final   Performed at [facility], [address], [place], [place] [address]   Alcohol, Ethyl (B) 05/06/2023 <10  <10 mg/dL Final   Comment: (NOTE) Lowest detectable limit for serum alcohol is 10 mg/dL.  For medical purposes only. Performed at [facility], [address] [place], [place] [address]    Acetaminophen  (Tylenol ), Serum 05/06/2023 <10 (L)  10 - 30 ug/mL Final   Comment: (NOTE) Therapeutic concentrations vary significantly. A range of 10-30 ug/mL  may be an effective concentration for many patients. However, some  are best treated at concentrations outside of this range. Acetaminophen  concentrations >150 ug/mL at 4 hours after ingestion  and >50 ug/mL at 12 hours after ingestion are often associated with  toxic reactions.  Performed at [facility], [address] [place], [place] [address]    Opiates 05/06/2023 NONE DETECTED  NONE  DETECTED Final   Cocaine 05/06/2023 NONE DETECTED  NONE DETECTED Final   Benzodiazepines 05/06/2023 NONE DETECTED  NONE DETECTED Final   Amphetamines 05/06/2023 NONE DETECTED  NONE DETECTED Final   Tetrahydrocannabinol 05/06/2023 NONE DETECTED  NONE DETECTED Final   Barbiturates 05/06/2023 NONE DETECTED  NONE DETECTED Final   Comment: (NOTE) DRUG SCREEN FOR MEDICAL PURPOSES ONLY.  IF CONFIRMATION IS NEEDED FOR ANY PURPOSE, NOTIFY LAB WITHIN 5 DAYS.  LOWEST DETECTABLE LIMITS FOR URINE DRUG SCREEN Drug Class                     Cutoff (ng/mL) Amphetamine and metabolites    1000 Barbiturate and metabolites    200 Benzodiazepine                 200 Opiates and metabolites        300 Cocaine and metabolites        300 THC                            50 Performed at [facility], [address] [place], [place] [address]   There may be more visits with results that are not included.    Blood Alcohol level:  Lab Results  Component Value Date   [facility] <15 09/02/2023   ETH <10 05/28/2023    Metabolic Disorder Labs: Lab Results  Component Value Date   HGBA1C 8.7 (H) 06/13/2023   MPG 203 06/13/2023   MPG 228.82 02/27/2023  No results found for: PROLACTIN Lab Results  Component Value Date   CHOL 115 03/13/2023   TRIG 200.0 (H) 03/13/2023   HDL 32.90 (L) 03/13/2023   CHOLHDL 3 03/13/2023   VLDL 40.0 03/13/2023   LDLCALC 42 03/13/2023   LDLCALC 40 05/02/2020    Therapeutic Lab Levels: No results found for: LITHIUM No results found for: VALPROATE No results found for: CBMZ  Physical Findings   AUDIT    Flowsheet Row Admission (Discharged) from 03/17/2023 in [facility]  Alcohol Use Disorder Identification Test Final Score (AUDIT) 0   GAD-7    Flowsheet Row ED from 05/15/2023 in [facility] Emergency Department at [facility] Office Visit from 12/27/2022 in [facility] at [facility] Office  Visit from 08/23/2022 in [name] Visit from 08/20/2022 in [facility] at [facility]  Total GAD-7 Score 11 11 0 7   Mini-Mental    Flowsheet Row Clinical Support from 12/26/2016 in [facility] at [facility]  Total Score (max 30 points ) 20   PHQ2-9    Flowsheet Row Office Visit from 12/27/2022 in [facility] Clinical Support from 12/03/2022 in [facility] at [facility] Office Visit from 08/23/2022 in [facility] at [facility] Office Visit from 08/20/2022 in [facility]Care at [facility] Office Visit from 04/02/2022 in [facility] HealthCare at [facility]  PHQ-2 Total Score 2 0 0 4 0  PHQ-9 Total Score 11 -- 0 10 --   Flowsheet Row ED from 09/02/2023 in [facility] Most recent reading at 09/02/2023 11:05 PM ED from 09/02/2023 in [facility] Emergency Department at [facility] Most recent reading at 09/02/2023 10:40 AM ED from 09/02/2023 in [facility] Most recent reading at 09/02/2023  7:51 AM  C-SSRS RISK CATEGORY High Risk High Risk High Risk     Musculoskeletal  Strength & Muscle Tone: within normal limits Gait & Station: normal Patient leans: N/A  Psychiatric Specialty Exam  Presentation  General Appearance:  Appropriate for Environment  Eye Contact: Fair  Speech: Normal Rate  Speech Volume: Normal  Handedness: Right   Mood and Affect  Mood: Anxious; Depressed  Affect: Flat   Thought Process  Thought Processes: Coherent  Descriptions of Associations:Intact  Orientation:Full (Time, Place and Person)  Thought Content:Scattered  Diagnosis of Schizophrenia or Schizoaffective disorder in past: No    Hallucinations:No data recorded Ideas of Reference:None  Suicidal Thoughts:No data recorded Homicidal Thoughts:No  data recorded  Sensorium  Memory: Immediate Fair  Judgment: Poor  Insight: Fair   Art therapist  Concentration: Poor  Attention Span: Poor  Recall: [employer] of Knowledge: Fair  Language: Fair   Psychomotor Activity  Psychomotor Activity:No data recorded  Assets  Assets: Desire for Improvement; Housing; Social Support   Sleep  Sleep:No data recorded No Safety Checks orders active in given range  No data recorded  Physical Exam  Physical Exam Vitals and nursing note reviewed.  Constitutional:      General: She is not in acute distress. HENT:     Head: Normocephalic and atraumatic.  Pulmonary:     Effort: Pulmonary effort is normal.  Abdominal:     Tenderness: There is no abdominal tenderness. There is no right CVA tenderness or left CVA tenderness.   Musculoskeletal:  General: Normal range of motion.   Skin:    General: Skin is warm and dry.     Capillary Refill: Capillary refill takes less than 2 seconds.   Neurological:     General: No focal deficit present.     Mental Status: She is alert.     Comments: Ambulating with walker    Review of Systems  Constitutional:  Negative for fever.  Cardiovascular:  Negative for chest pain and palpitations.  Gastrointestinal:  Negative for constipation, diarrhea, nausea and vomiting.  Genitourinary:  Positive for dysuria, flank pain, frequency and urgency.  Neurological:  Negative for dizziness, weakness and headaches.   Blood pressure (!) 141/76, pulse 74, temperature 98 F (36.7 C), temperature source Oral, resp. rate 15, SpO2 100%. There is no height or weight on file to calculate BMI.  Treatment Plan Summary: Daily contact with patient to assess and evaluate symptoms and progress in treatment, Medication management, and Plan to transfer to [facility] if patient can be accepted there to provide a more comfortable place given age unitl safe discharge can be determined.   Plan for transition  to medicaid and after this follow up with Ava who is in contact with TOC and transitional housing program. Also per Ava, plan for APS follow up on [name] taking her social security checks.   Psychiatrically stable for discharge pending safe placement.   Medically stable at this time. Low DBP yesterday but have low clinical suspicion for sepsis or progression of UTI. Culture results returned and will discuss if cefpodoxime  is appropriate to treat this infection.   Justino Cornish, MD PGY-1 Psychiatry Resident 09/05/2023, 2:08 PM

## 2023-09-05 NOTE — Care Management (Signed)
 OBS Care Management  The Transitional Living Director reports that she is not able to meet with the patient until after the patient's finances are secured through APS.   The APS worker reports that the APS case is still being investigated because they have not meet with the ex-boyfriend.

## 2023-09-05 NOTE — ED Notes (Addendum)
 Patient took a shower assisted by MHTs due to incontinent of urine. Patient was given breakfast and bed sheets changed.

## 2023-09-05 NOTE — Telephone Encounter (Signed)
 Post ED Visit - Positive Culture Follow-up  Culture report reviewed by antimicrobial stewardship pharmacist: [name] []  Rankin Dee, Pharm.D. []  Venetia Gully, Pharm.D., BCPS AQ-ID []  Garrel Crews, Pharm.D., BCPS []  Almarie Lunger, Pharm.D., BCPS []  [place], [place].D., BCPS, AAHIVP []  Rosaline Bihari, Pharm.D., BCPS, AAHIVP []  Vernell Meier, PharmD, BCPS []  Latanya Hint, PharmD, BCPS []  Donald Medley, PharmD, BCPS []  Rocky Bold, PharmD []  Dorothyann Alert, PharmD, BCPS []  Morene Babe, PharmD  [facility] [x]  Damien Quiet, PharmD []  Romona Bliss, PharmD []  Dolphus Roller, PharmD []  Veva Seip, Rph []  Vernell Daunt) Leonce, PharmD []  Eva Allis, PharmD []  Rosaline Millet, PharmD []  Iantha Batch, PharmD []  Arvin Gauss, PharmD []  Wanda Hasting, PharmD []  Ronal Rav, PharmD []  Rocky Slade, PharmD []  Bard Jeans, PharmD   Positive urine culture Treated with Nitrofurantoin  Monohyd Macro, organism sensitive to the same and no further patient follow-up is required at this time.  Jama Lisle Blondie 09/05/2023, 9:05 AM

## 2023-09-05 NOTE — ED Notes (Signed)
 Patient calm and cooperative during initial nurse shift assessment. Pt observed/assessed in recliner eating dinner. Patient denies any visual hallucination, suicidal ideations, and homicidal ideations. Patient reports auditory hallucination intermittently . Patient reports pain when urinating. Patient educated on UTI and antibiotics to help treat UTI . RR even and unlabored, appearing in no noted distress. Environmental check complete.

## 2023-09-05 NOTE — ED Notes (Signed)
 Pt A&Ox4. NAD, denies SI/HI/AVH. Will continue to monitor.

## 2023-09-06 ENCOUNTER — Encounter (HOSPITAL_COMMUNITY): Payer: Self-pay

## 2023-09-06 ENCOUNTER — Emergency Department (HOSPITAL_COMMUNITY)
Admission: EM | Admit: 2023-09-06 | Discharge: 2023-09-09 | Disposition: A | Discharge status: Home / Self Care | Attending: Student | Admitting: Student

## 2023-09-06 DIAGNOSIS — M6281 Muscle weakness (generalized): Secondary | ICD-10-CM | POA: Diagnosis not present

## 2023-09-06 DIAGNOSIS — I10 Essential (primary) hypertension: Secondary | ICD-10-CM | POA: Diagnosis not present

## 2023-09-06 DIAGNOSIS — I129 Hypertensive chronic kidney disease with stage 1 through stage 4 chronic kidney disease, or unspecified chronic kidney disease: Secondary | ICD-10-CM | POA: Diagnosis not present

## 2023-09-06 DIAGNOSIS — Z951 Presence of aortocoronary bypass graft: Secondary | ICD-10-CM | POA: Diagnosis not present

## 2023-09-06 DIAGNOSIS — N183 Chronic kidney disease, stage 3 unspecified: Secondary | ICD-10-CM | POA: Diagnosis not present

## 2023-09-06 DIAGNOSIS — N390 Urinary tract infection, site not specified: Secondary | ICD-10-CM | POA: Diagnosis not present

## 2023-09-06 DIAGNOSIS — Z91148 Patient's other noncompliance with medication regimen for other reason: Secondary | ICD-10-CM | POA: Diagnosis not present

## 2023-09-06 DIAGNOSIS — Z59819 Housing instability, housed unspecified: Secondary | ICD-10-CM | POA: Diagnosis not present

## 2023-09-06 DIAGNOSIS — Z79899 Other long term (current) drug therapy: Secondary | ICD-10-CM | POA: Diagnosis not present

## 2023-09-06 DIAGNOSIS — F339 Major depressive disorder, recurrent, unspecified: Secondary | ICD-10-CM | POA: Diagnosis not present

## 2023-09-06 DIAGNOSIS — E1122 Type 2 diabetes mellitus with diabetic chronic kidney disease: Secondary | ICD-10-CM | POA: Insufficient documentation

## 2023-09-06 DIAGNOSIS — I4891 Unspecified atrial fibrillation: Secondary | ICD-10-CM | POA: Diagnosis not present

## 2023-09-06 DIAGNOSIS — Z5902 Unsheltered homelessness: Secondary | ICD-10-CM | POA: Diagnosis not present

## 2023-09-06 DIAGNOSIS — I2510 Atherosclerotic heart disease of native coronary artery without angina pectoris: Secondary | ICD-10-CM | POA: Diagnosis not present

## 2023-09-06 DIAGNOSIS — Z7901 Long term (current) use of anticoagulants: Secondary | ICD-10-CM | POA: Diagnosis not present

## 2023-09-06 DIAGNOSIS — Z7902 Long term (current) use of antithrombotics/antiplatelets: Secondary | ICD-10-CM | POA: Diagnosis not present

## 2023-09-06 DIAGNOSIS — L80 Vitiligo: Secondary | ICD-10-CM | POA: Diagnosis not present

## 2023-09-06 DIAGNOSIS — N3000 Acute cystitis without hematuria: Secondary | ICD-10-CM | POA: Diagnosis not present

## 2023-09-06 DIAGNOSIS — R300 Dysuria: Secondary | ICD-10-CM | POA: Diagnosis present

## 2023-09-06 DIAGNOSIS — R4589 Other symptoms and signs involving emotional state: Secondary | ICD-10-CM | POA: Diagnosis not present

## 2023-09-06 DIAGNOSIS — Z7984 Long term (current) use of oral hypoglycemic drugs: Secondary | ICD-10-CM | POA: Insufficient documentation

## 2023-09-06 DIAGNOSIS — R45851 Suicidal ideations: Secondary | ICD-10-CM | POA: Diagnosis not present

## 2023-09-06 DIAGNOSIS — F329 Major depressive disorder, single episode, unspecified: Secondary | ICD-10-CM | POA: Diagnosis not present

## 2023-09-06 DIAGNOSIS — Z794 Long term (current) use of insulin: Secondary | ICD-10-CM | POA: Diagnosis not present

## 2023-09-06 LAB — CBC WITH DIFFERENTIAL/PLATELET
Abs Immature Granulocytes: 0.05 10*3/uL (ref 0.00–0.07)
Basophils Absolute: 0.1 10*3/uL (ref 0.0–0.1)
Basophils Relative: 1 %
Eosinophils Absolute: 0.3 10*3/uL (ref 0.0–0.5)
Eosinophils Relative: 4 %
HCT: 36.8 % (ref 36.0–46.0)
Hemoglobin: 11.8 g/dL — ABNORMAL LOW (ref 12.0–15.0)
Immature Granulocytes: 1 %
Lymphocytes Relative: 29 %
Lymphs Abs: 2 10*3/uL (ref 0.7–4.0)
MCH: 28.9 pg (ref 26.0–34.0)
MCHC: 32.1 g/dL (ref 30.0–36.0)
MCV: 90 fL (ref 80.0–100.0)
Monocytes Absolute: 0.5 10*3/uL (ref 0.1–1.0)
Monocytes Relative: 7 %
Neutro Abs: 3.9 10*3/uL (ref 1.7–7.7)
Neutrophils Relative %: 58 %
Platelets: 238 10*3/uL (ref 150–400)
RBC: 4.09 MIL/uL (ref 3.87–5.11)
RDW: 14.1 % (ref 11.5–15.5)
WBC: 6.9 10*3/uL (ref 4.0–10.5)
nRBC: 0 % (ref 0.0–0.2)

## 2023-09-06 LAB — URINALYSIS, ROUTINE W REFLEX MICROSCOPIC
Bacteria, UA: NONE SEEN
Bilirubin Urine: NEGATIVE
Glucose, UA: NEGATIVE mg/dL
Hgb urine dipstick: NEGATIVE
Ketones, ur: NEGATIVE mg/dL
Nitrite: NEGATIVE
Protein, ur: NEGATIVE mg/dL
Specific Gravity, Urine: 1.005 (ref 1.005–1.030)
pH: 7 (ref 5.0–8.0)

## 2023-09-06 LAB — COMPREHENSIVE METABOLIC PANEL WITH GFR
ALT: 12 U/L (ref 0–44)
AST: 16 U/L (ref 15–41)
Albumin: 3.5 g/dL (ref 3.5–5.0)
Alkaline Phosphatase: 95 U/L (ref 38–126)
Anion gap: 7 (ref 5–15)
BUN: 22 mg/dL (ref 8–23)
CO2: 25 mmol/L (ref 22–32)
Calcium: 9.6 mg/dL (ref 8.9–10.3)
Chloride: 102 mmol/L (ref 98–111)
Creatinine, Ser: 1.08 mg/dL — ABNORMAL HIGH (ref 0.44–1.00)
GFR, Estimated: 52 mL/min — ABNORMAL LOW (ref >60)
Glucose, Bld: 194 mg/dL — ABNORMAL HIGH (ref 70–99)
Potassium: 5 mmol/L (ref 3.5–5.1)
Sodium: 134 mmol/L — ABNORMAL LOW (ref 135–145)
Total Bilirubin: 0.4 mg/dL (ref 0.0–1.2)
Total Protein: 6.9 g/dL (ref 6.5–8.1)

## 2023-09-06 LAB — GLUCOSE, CAPILLARY
Glucose-Capillary: 260 mg/dL — ABNORMAL HIGH (ref 70–99)
Glucose-Capillary: 285 mg/dL — ABNORMAL HIGH (ref 70–99)

## 2023-09-06 LAB — RAPID URINE DRUG SCREEN, HOSP PERFORMED
Amphetamines: NOT DETECTED
Barbiturates: NOT DETECTED
Benzodiazepines: NOT DETECTED
Cocaine: NOT DETECTED
Opiates: NOT DETECTED
Tetrahydrocannabinol: NOT DETECTED

## 2023-09-06 MED ORDER — GLIPIZIDE 5 MG PO TABS
10.0000 mg | ORAL_TABLET | Freq: Two times a day (BID) | ORAL | Status: DC
  Administered 2023-09-06 – 2023-09-09 (×6): 10 mg via ORAL
  Filled 2023-09-06 (×6): qty 2

## 2023-09-06 MED ORDER — APIXABAN 5 MG PO TABS
5.0000 mg | ORAL_TABLET | Freq: Two times a day (BID) | ORAL | Status: DC
  Administered 2023-09-06 – 2023-09-09 (×6): 5 mg via ORAL
  Filled 2023-09-06 (×3): qty 1
  Filled 2023-09-06 (×2): qty 2
  Filled 2023-09-06: qty 1

## 2023-09-06 MED ORDER — DORZOLAMIDE HCL-TIMOLOL MAL 2-0.5 % OP SOLN
1.0000 [drp] | Freq: Two times a day (BID) | OPHTHALMIC | Status: DC
  Administered 2023-09-06 – 2023-09-09 (×6): 1 [drp] via OPHTHALMIC
  Filled 2023-09-06: qty 10

## 2023-09-06 MED ORDER — DULOXETINE HCL 30 MG PO CPEP
60.0000 mg | ORAL_CAPSULE | Freq: Every day | ORAL | Status: DC
  Administered 2023-09-06 – 2023-09-08 (×3): 60 mg via ORAL
  Filled 2023-09-06 (×3): qty 2

## 2023-09-06 MED ORDER — CEPHALEXIN 500 MG PO CAPS
500.0000 mg | ORAL_CAPSULE | Freq: Four times a day (QID) | ORAL | Status: DC
  Administered 2023-09-06 – 2023-09-09 (×10): 500 mg via ORAL
  Filled 2023-09-06 (×10): qty 1

## 2023-09-06 MED ORDER — HYDROCORTISONE 0.5 % EX CREA
TOPICAL_CREAM | Freq: Two times a day (BID) | CUTANEOUS | Status: DC
  Filled 2023-09-06 (×2): qty 28.35

## 2023-09-06 MED ORDER — ACETAMINOPHEN 500 MG PO TABS
500.0000 mg | ORAL_TABLET | Freq: Four times a day (QID) | ORAL | Status: DC | PRN
  Administered 2023-09-06 – 2023-09-08 (×5): 500 mg via ORAL
  Filled 2023-09-06 (×5): qty 1

## 2023-09-06 MED ORDER — ATORVASTATIN CALCIUM 40 MG PO TABS
40.0000 mg | ORAL_TABLET | Freq: Every day | ORAL | Status: DC
  Administered 2023-09-06 – 2023-09-09 (×4): 40 mg via ORAL
  Filled 2023-09-06 (×4): qty 1

## 2023-09-06 MED ORDER — METFORMIN HCL ER 500 MG PO TB24
500.0000 mg | ORAL_TABLET | Freq: Every day | ORAL | Status: DC
  Administered 2023-09-07 – 2023-09-09 (×3): 500 mg via ORAL
  Filled 2023-09-06 (×3): qty 1

## 2023-09-06 MED ORDER — LATANOPROST 0.005 % OP SOLN
1.0000 [drp] | Freq: Every day | OPHTHALMIC | Status: DC
  Administered 2023-09-06 – 2023-09-08 (×3): 1 [drp] via OPHTHALMIC
  Filled 2023-09-06: qty 2.5

## 2023-09-06 MED ORDER — ALBUTEROL SULFATE HFA 108 (90 BASE) MCG/ACT IN AERS: 1.0000 | INHALATION_SPRAY | Freq: Four times a day (QID) | RESPIRATORY_TRACT | Status: DC | PRN

## 2023-09-06 MED ORDER — METOPROLOL SUCCINATE ER 50 MG PO TB24
25.0000 mg | ORAL_TABLET | Freq: Every day | ORAL | Status: DC
  Administered 2023-09-06 – 2023-09-09 (×4): 25 mg via ORAL
  Filled 2023-09-06 (×4): qty 1

## 2023-09-06 NOTE — ED Notes (Signed)
 Patient provided lunch

## 2023-09-06 NOTE — ED Provider Notes (Signed)
 [place] EMERGENCY DEPARTMENT AT [facility] Provider Note   CSN: [id number] Arrival date & time: 09/06/23  1434     Patient presents with: SNF Placement and Dysuria   Maria Fox is a 78 y.o. female.  Patient with past history significant for atrial fibrillation, type 2 diabetes, hypertension, anxiety, depression, suicidal ideation presents to the emergency department with concern for dysuria and SNF placement.  Patient reported was at behavioral urgent care earlier today and advised to come into the emergency department for continued treatment of her UTI and for SNF placement.  Based on chart review with behavioral health urgent care note, patient is currently pending SNF placement on Monday or Tuesday per [name], [facility] for [employer]. On 09/02/2023, patient seen the ED and found to have UTI. Discharged to [facility] on Cefpodoxime  BID. Regarding UTI, she reports continued feelings of urinary discomfort and pressure in the lower abdomen. Concerned for uterine prolapse but no history of such. States that she had been receiving antibiotics while at [facility].   Dysuria      Prior to Admission medications   Medication Sig Start Date End Date Taking? Authorizing Provider  albuterol  (PROVENTIL ) (2.5 MG/3ML) 0.083% nebulizer solution Inhale 3 mLs into the lungs every 6 (six) hours as needed for wheezing or shortness of breath. 03/26/23   Jadapalle, Sree, MD  albuterol  (VENTOLIN  HFA) 108 [phone] Base) MCG/ACT inhaler Inhale 1-2 puffs into the lungs every 6 (six) hours as needed for wheezing or shortness of breath. 05/15/23   Rilla Baller, MD  apixaban  (ELIQUIS ) 5 MG TABS tablet Take 1 tablet (5 mg total) by mouth 2 (two) times daily. 05/15/23   Rilla Baller, MD  ARIPiprazole  (ABILIFY ) 2 MG tablet Take 1 tablet (2 mg total) by mouth daily. 06/21/23   Jillian Buttery, MD  atorvastatin  (LIPITOR) 40 MG tablet TAKE 1 TABLET BY MOUTH DAILY EACH EVENING 09/03/23   Rilla Baller, MD   clopidogrel  (PLAVIX ) 75 MG tablet Take 1 tablet (75 mg total) by mouth daily. 05/15/23   Rilla Baller, MD  dorzolamide -timolol  (COSOPT ) 2-0.5 % ophthalmic solution Place 1 drop into both eyes 2 (two) times daily. 04/24/23   [provider]  DULoxetine  (CYMBALTA ) 60 MG capsule Take 1 capsule (60 mg total) by mouth at bedtime. 06/20/23   Jillian Buttery, MD  feeding supplement, GLUCERNA SHAKE, (GLUCERNA SHAKE) LIQD Take 237 mLs by mouth 3 (three) times daily between meals. Patient taking differently: Take 237 mLs by mouth in the morning and at bedtime. 03/26/23   Donnelly Mellow, MD  glipiZIDE  (GLUCOTROL ) 10 MG tablet TAKE 1 TABLET BY MOUTH TWICE DAILY 09/03/23   Rilla Baller, MD  hydrocortisone  cream 1 % Apply topically 3 (three) times daily. Patient taking differently: Apply 1 Application topically 2 (two) times daily as needed for itching. 03/26/23   Jadapalle, Sree, MD  isosorbide  mononitrate (IMDUR ) 30 MG 24 hr tablet Take 0.5 tablets (15 mg total) by mouth daily. 05/15/23   Rilla Baller, MD  latanoprost  (XALATAN ) 0.005 % ophthalmic solution Place 1 drop into both eyes at bedtime. 03/26/23   Jadapalle, Sree, MD  metFORMIN  (GLUCOPHAGE -XR) 500 MG 24 hr tablet Take 1 tablet (500 mg total) by mouth daily with breakfast. 05/15/23   Rilla Baller, MD  metoprolol  succinate (TOPROL -XL) 25 MG 24 hr tablet Take 1 tablet (25 mg total) by mouth daily. 05/15/23   Rilla Baller, MD  nitroGLYCERIN  (NITROSTAT ) 0.4 MG SL tablet Place 0.4 mg under the tongue every 5 (five) minutes  as needed for chest pain. 04/04/23   [provider]  pantoprazole  (PROTONIX ) 40 MG tablet TAKE ONE (1) TABLET BY MOUTH TWICE DAILY 09/03/23   Rilla Baller, MD    Allergies: Bee venom, Mushroom extract complex (obsolete), Penicillins, Codeine, Sulfa  antibiotics, and Iohexol     Review of Systems  Genitourinary:  Positive for dysuria.  All other systems reviewed and are negative.   Updated Vital  Signs BP (!) 142/72 (BP Location: Right Arm)   Pulse 74   Temp (!) 97.4 F (36.3 C) (Axillary)   Resp 16   Ht 5' 3 (1.6 m)   Wt 61.2 kg   SpO2 98%   BMI 23.91 kg/m   Physical Exam Vitals and nursing note reviewed.  Constitutional:      General: She is not in acute distress.    Appearance: She is well-developed.  HENT:     Head: Normocephalic and atraumatic.   Eyes:     Conjunctiva/sclera: Conjunctivae normal.    Cardiovascular:     Rate and Rhythm: Normal rate and regular rhythm.     Heart sounds: No murmur heard. Pulmonary:     Effort: Pulmonary effort is normal. No respiratory distress.     Breath sounds: Normal breath sounds.  Abdominal:     Palpations: Abdomen is soft.     Tenderness: There is no abdominal tenderness.   Musculoskeletal:        General: No swelling.     Cervical back: Neck supple.   Skin:    General: Skin is warm and dry.     Capillary Refill: Capillary refill takes less than 2 seconds.   Neurological:     Mental Status: She is alert.   Psychiatric:        Mood and Affect: Mood normal.     (all labs ordered are listed, but only abnormal results are displayed) Labs Reviewed  CBC WITH DIFFERENTIAL/PLATELET - Abnormal; Notable for the following components:      Result Value   Hemoglobin 11.8 (*)    All other components within normal limits  COMPREHENSIVE METABOLIC PANEL WITH GFR - Abnormal; Notable for the following components:   Sodium 134 (*)    Glucose, Bld 194 (*)    Creatinine, Ser 1.08 (*)    GFR, Estimated 52 (*)    All other components within normal limits  URINALYSIS, ROUTINE W REFLEX MICROSCOPIC - Abnormal; Notable for the following components:   Color, Urine STRAW (*)    Leukocytes,Ua SMALL (*)    All other components within normal limits  RAPID URINE DRUG SCREEN, HOSP PERFORMED    EKG: None  Radiology: No results found.   Procedures   Medications Ordered in the ED  hydrocortisone  cream 0.5 % ( Topical Given  09/06/23 1731)  apixaban  (ELIQUIS ) tablet 5 mg (5 mg Oral Given by Other 09/06/23 2155)  metFORMIN  (GLUCOPHAGE -XR) 24 hr tablet 500 mg (has no administration in time range)  metoprolol  succinate (TOPROL -XL) 24 hr tablet 25 mg (25 mg Oral Given 09/06/23 2009)  latanoprost  (XALATAN ) 0.005 % ophthalmic solution 1 drop (1 drop Both Eyes Given 09/06/23 2156)  glipiZIDE  (GLUCOTROL ) tablet 10 mg (10 mg Oral Given by Other 09/06/23 2155)  DULoxetine  (CYMBALTA ) DR capsule 60 mg (60 mg Oral Given by Other 09/06/23 2155)  dorzolamide -timolol  (COSOPT ) 2-0.5 % ophthalmic solution 1 drop (1 drop Both Eyes Given 09/06/23 2156)  albuterol  (VENTOLIN  HFA) 108 (90 Base) MCG/ACT inhaler 1-2 puff (has no administration in time range)  atorvastatin  (  LIPITOR) tablet 40 mg (40 mg Oral Given by Other 09/06/23 2009)  acetaminophen  (TYLENOL ) tablet 500 mg (500 mg Oral Given 09/06/23 2009)  cephALEXin  (KEFLEX ) capsule 500 mg (500 mg Oral Given 09/06/23 2037)                                    Medical Decision Making Amount and/or Complexity of Data Reviewed Labs: ordered.  Risk OTC drugs. Prescription drug management.   This patient presents to the ED for concern of dysuria, SNF placement, this involves an extensive number of treatment options, and is a complaint that carries with it a high risk of complications and morbidity.  The differential diagnosis includes UTI, pyelonephritis, urosepsis, homelessness   Co morbidities that complicate the patient evaluation  Housing instability, poorly controlled depression   Additional history obtained:  Additional history obtained from the [facility] from recent psychiatric stay over the last several days from 08/29/2023 through today.   Lab Tests:  I Ordered, and personally interpreted labs.  The pertinent results include: CBC with hemoglobin 11.8, CMP shows mild hyponatremia 134 no evident AKI, UA no signs of infection but likely secondary to patient currently being on  antibiotic therapy, UDS negative   Cardiac Monitoring: / EKG:  The patient was maintained on a cardiac monitor.  I personally viewed and interpreted the cardiac monitored which showed an underlying rhythm of: Sinus rhythm   Consultations Obtained:  I requested consultation with TOC,  and discussed lab and imaging findings as well as pertinent plan - they recommend: SNF placement   Problem List / ED Course / Critical interventions / Medication management  Patient past history significant for anxiety, depression, atrial fibrillation, type 2 diabetes, CKD stage III presents ED with concerns of dysuria.  Was seen on 09/02/2023 and started on cefpodoxime .  Reports has been taking as prescribed.  Has been inpatient with behavioral health.  On chart review, patient was transported here for SNF placement that is set to occur on Monday or Tuesday of this upcoming week.  Contact point is [name], CNO for [employer].  Patient has no concerns endorses dysuria and lower pelvic pressure and fullness. Physical exam is reassuring.  Patient has no acute psychiatric concerns at this time.  Continues to report lower pelvic pain.  Requesting pelvic examination.  As patient is currently in a hall bed, informed nurse of patient's request for pelvic examination.  Suspect likely UTI. Lab work unremarkable.  UA without other signs infection with some leukocytes was seen.  Given the patient is on antibiotic therapy, unclear patient's UA is altered from antibiotic use.  Will continue with antibiotic therapy.  Cefpodoxime  not available here and will tolerate Keflex  and will switch to Keflex . I ordered medication including Keflex , hydrocortisone  cream, home medication for UTI, itching, boarder Reevaluation of the patient after these medicines showed that the patient improved I have reviewed the patients home medicines and have made adjustments as needed   Social Determinants of Health:  Housing instability,  poorly controlled depression   Test / Admission - Considered:  Patient not currently meeting inpatient criteria.   Final diagnoses:  Acute cystitis without hematuria  Housing instability    ED Discharge Orders     None          Jairen Goldfarb A, PA-C 09/06/23 2214    Albertina Dixon, MD 09/07/23 [phone]

## 2023-09-06 NOTE — ED Notes (Signed)
 Patient assisted ot restroom. 1 occurrence of incontinence, pericare provided. Skin on buttocks pink in color, patient reports using purple top (hospital supplied barrier cream) at home but has been unable to do so here as we do not supply it. Writer informed patient to request this at [facility] as she is being sent out there for medical clearance d/t abdominal pain. Patient voiced understanding. Environment secured, safety checks in place per facility policy.

## 2023-09-06 NOTE — ED Provider Notes (Signed)
 FBC/OBS ASAP Discharge Summary  Date and Time: 09/06/2023 1:58 PM  Name: Maria Fox  MRN:  [id number]   Discharge Diagnoses:  Final diagnoses:  Suicidal ideation  Recurrent major depressive disorder, remission status unspecified ([name])  Ineffective coping  Urinary tract infection without hematuria, site unspecified  Dermatitis    Subjective: I'm alright   Stay Summary: Per triage on 09/02/2023, Maria Fox presents to [facility] voluntarily unaccompanied. Pt states that she was at [facility] and was told that she has a UTI. Pt states that she doesn't have anywhere to go and they told her to come here. Pt shares that she asked the staff at [facility] if they would admit her to the psych ward because she wants to hurt herself and somebody else. Pt states that she had SI thoughts 2 days ago with the plan to overdose. Pt states that she also had HI thoughts 2 days ago against her friend Maria Fox. Pt states that she still feels SI and HI at this time.    Chart reviewed and discussed with attending psychiatrist, Dr Corean Potters.   Maria Fox is seen face-to-face on the [facility] observation unit. She is alert and oriented x 4 and engages in today's evaluation.  States that she slept pretty good overnight.  Endorses incontinence of urine overnight.  States her appetite is fair.  She is complaining of itching to her right lateral bicep.  On assessment there is a small area of erythema without drainage.  She currently has triamcinolone  cream ordered and nursing staff will be advised to apply to area of erythema.  She denies suicidal or homicidal ideation, intent, or plan she denies AVH.  Patient is psychiatrically stable and is currently pending safe discharge with plan to discharge to skilled nursing facility on Monday or Tuesday.  Patient will remain on [facility] observation unit until SNF placement is secured.   1300 This practitioner was notified that patient was c/o her uterus or bladder - something just ain't right    1325 On  re-assessment patient c/o lower abdominal and supra pubic pain that began suddenly and worsens with urination. Patient denies N/V/D. She describes vaginal pressure and I have to hold myself when using the bathroom. Will send to the ED for evaluation.   Total Time spent with patient: 20 minutes  Past Psychiatric History: MDD recurrent Past Medical History: Dependent in ADLs.  Urinary continence, T2DM, CAD status post CABG, chronic hatha, stage III CKD, A-fib; recent hospitalizations Family History: No pertinent Family Psychiatric  History: No pertinent Social History: Patient is currently experiencing homelessness and living in her car outside of her ex-partner's house.  Patient reports having still security checks which her ex-partner is utilizing and not providing the patient.  Patient does not endorse any significant substance use. Tobacco Cessation:  Prescription not provided because patient transferred to the ED  Current Medications:  Current Facility-Administered Medications  Medication Dose Route Frequency Provider Last Rate Last Admin   acetaminophen  (TYLENOL ) tablet 650 mg  650 mg Oral Q6H PRN Trudy Carwin, NP   650 mg at 09/05/23 2137   alum & mag hydroxide-simeth (MAALOX/MYLANTA) 200-200-20 MG/5ML suspension 30 mL  30 mL Oral Q4H PRN Trudy Carwin, NP       apixaban  (ELIQUIS ) tablet 5 mg  5 mg Oral BID Trudy Carwin, NP   5 mg at 09/06/23 1018   ARIPiprazole  (ABILIFY ) tablet 2 mg  2 mg Oral Daily Trudy Carwin, NP   2 mg at 09/06/23 1018   atorvastatin  (  LIPITOR) tablet 40 mg  40 mg Oral QPM Trudy Carwin, NP   40 mg at 09/05/23 1741   cephALEXin  (KEFLEX ) capsule 250 mg  250 mg Oral Q12H McCarty, Artie, MD   250 mg at 09/06/23 1018   dorzolamide -timolol  (COSOPT ) 2-0.5 % ophthalmic solution 1 drop  1 drop Both Eyes BID Trudy Carwin, NP   1 drop at 09/06/23 1020   DULoxetine  (CYMBALTA ) DR capsule 60 mg  60 mg Oral QHS Trudy Carwin, NP   60 mg at 09/05/23 2125   feeding supplement  (GLUCERNA SHAKE) (GLUCERNA SHAKE) liquid 237 mL  237 mL Oral TID BM Trudy Carwin, NP   237 mL at 09/06/23 1325   glipiZIDE  (GLUCOTROL ) tablet 10 mg  10 mg Oral BID Trudy Carwin, NP   10 mg at 09/06/23 1018   hydrOXYzine  (ATARAX ) tablet 10 mg  10 mg Oral TID PRN Arloa Suzen RAMAN, NP   10 mg at 09/03/23 9083   insulin  aspart (novoLOG ) injection 0-5 Units  0-5 Units Subcutaneous QHS Arloa Suzen RAMAN, NP       insulin  aspart (novoLOG ) injection 0-9 Units  0-9 Units Subcutaneous TID WC Arloa Suzen RAMAN, NP   5 Units at 09/06/23 1207   isosorbide  mononitrate (IMDUR ) 24 hr tablet 15 mg  15 mg Oral Daily Trudy Carwin, NP   15 mg at 09/06/23 1018   latanoprost  (XALATAN ) 0.005 % ophthalmic solution 1 drop  1 drop Both Eyes QHS Trudy Carwin, NP   1 drop at 09/05/23 2126   loperamide  (IMODIUM ) capsule 2-4 mg  2-4 mg Oral PRN McCarty, Artie, MD   2 mg at 09/05/23 1356   magnesium  hydroxide (MILK OF MAGNESIA) suspension 30 mL  30 mL Oral Daily PRN Trudy Carwin, NP       metoprolol  tartrate (LOPRESSOR ) tablet 12.5 mg  12.5 mg Oral BID Arloa Suzen RAMAN, NP   12.5 mg at 09/06/23 1018   OLANZapine  (ZYPREXA ) injection 5 mg  5 mg Intramuscular TID PRN Trudy Carwin, NP       OLANZapine  zydis (ZYPREXA ) disintegrating tablet 5 mg  5 mg Oral TID PRN Trudy Carwin, NP       triamcinolone  (KENALOG ) 0.025 % cream   Topical TID PRN Arloa Suzen RAMAN, NP   1 Application at 09/06/23 1022   Current Outpatient Medications  Medication Sig Dispense Refill   albuterol  (PROVENTIL ) (2.5 MG/3ML) 0.083% nebulizer solution Inhale 3 mLs into the lungs every 6 (six) hours as needed for wheezing or shortness of breath. 75 mL 12   albuterol  (VENTOLIN  HFA) 108 (90 Base) MCG/ACT inhaler Inhale 1-2 puffs into the lungs every 6 (six) hours as needed for wheezing or shortness of breath. 6.7 g 3   nitroGLYCERIN  (NITROSTAT ) 0.4 MG SL tablet Place 0.4 mg under the tongue every 5 (five) minutes as needed for chest pain.     apixaban   (ELIQUIS ) 5 MG TABS tablet Take 1 tablet (5 mg total) by mouth 2 (two) times daily. 60 tablet 6   ARIPiprazole  (ABILIFY ) 2 MG tablet Take 1 tablet (2 mg total) by mouth daily. 60 tablet 0   atorvastatin  (LIPITOR) 40 MG tablet TAKE 1 TABLET BY MOUTH DAILY EACH EVENING 90 tablet 0   clopidogrel  (PLAVIX ) 75 MG tablet Take 1 tablet (75 mg total) by mouth daily. 90 tablet 4   dorzolamide -timolol  (COSOPT ) 2-0.5 % ophthalmic solution Place 1 drop into both eyes 2 (two) times daily.     DULoxetine  (CYMBALTA ) 60 MG capsule Take 1  capsule (60 mg total) by mouth at bedtime.     feeding supplement, GLUCERNA SHAKE, (GLUCERNA SHAKE) LIQD Take 237 mLs by mouth 3 (three) times daily between meals. (Patient taking differently: Take 237 mLs by mouth in the morning and at bedtime.) 30 mL 0   glipiZIDE  (GLUCOTROL ) 10 MG tablet TAKE 1 TABLET BY MOUTH TWICE DAILY 60 tablet 3   hydrocortisone  cream 1 % Apply topically 3 (three) times daily. (Patient taking differently: Apply 1 Application topically 2 (two) times daily as needed for itching.) 30 g 0   isosorbide  mononitrate (IMDUR ) 30 MG 24 hr tablet Take 0.5 tablets (15 mg total) by mouth daily. 45 tablet 4   latanoprost  (XALATAN ) 0.005 % ophthalmic solution Place 1 drop into both eyes at bedtime. 2.5 mL 12   metFORMIN  (GLUCOPHAGE -XR) 500 MG 24 hr tablet Take 1 tablet (500 mg total) by mouth daily with breakfast. 90 tablet 4   metoprolol  succinate (TOPROL -XL) 25 MG 24 hr tablet Take 1 tablet (25 mg total) by mouth daily. 90 tablet 4   pantoprazole  (PROTONIX ) 40 MG tablet TAKE ONE (1) TABLET BY MOUTH TWICE DAILY 60 tablet 3    PTA Medications:  Facility Ordered Medications  Medication   [COMPLETED] sodium chloride  0.9 % bolus 1,000 mL   [COMPLETED] fosfomycin (MONUROL ) packet 3 g   [COMPLETED] lactated ringers  bolus 1,000 mL   [COMPLETED] cefTRIAXone  (ROCEPHIN ) 1 g in sodium chloride  0.9 % 100 mL IVPB   acetaminophen  (TYLENOL ) tablet 650 mg   alum & mag  hydroxide-simeth (MAALOX/MYLANTA) 200-200-20 MG/5ML suspension 30 mL   magnesium  hydroxide (MILK OF MAGNESIA) suspension 30 mL   OLANZapine  zydis (ZYPREXA ) disintegrating tablet 5 mg   OLANZapine  (ZYPREXA ) injection 5 mg   apixaban  (ELIQUIS ) tablet 5 mg   ARIPiprazole  (ABILIFY ) tablet 2 mg   atorvastatin  (LIPITOR) tablet 40 mg   dorzolamide -timolol  (COSOPT ) 2-0.5 % ophthalmic solution 1 drop   DULoxetine  (CYMBALTA ) DR capsule 60 mg   feeding supplement (GLUCERNA SHAKE) (GLUCERNA SHAKE) liquid 237 mL   glipiZIDE  (GLUCOTROL ) tablet 10 mg   isosorbide  mononitrate (IMDUR ) 24 hr tablet 15 mg   latanoprost  (XALATAN ) 0.005 % ophthalmic solution 1 drop   triamcinolone  (KENALOG ) 0.025 % cream   hydrOXYzine  (ATARAX ) tablet 10 mg   metoprolol  tartrate (LOPRESSOR ) tablet 12.5 mg   insulin  aspart (novoLOG ) injection 0-9 Units   insulin  aspart (novoLOG ) injection 0-5 Units   [COMPLETED] loperamide  (IMODIUM ) capsule 2 mg   cephALEXin  (KEFLEX ) capsule 250 mg   loperamide  (IMODIUM ) capsule 2-4 mg   PTA Medications  Medication Sig   albuterol  (PROVENTIL ) (2.5 MG/3ML) 0.083% nebulizer solution Inhale 3 mLs into the lungs every 6 (six) hours as needed for wheezing or shortness of breath.   nitroGLYCERIN  (NITROSTAT ) 0.4 MG SL tablet Place 0.4 mg under the tongue every 5 (five) minutes as needed for chest pain.   albuterol  (VENTOLIN  HFA) 108 (90 Base) MCG/ACT inhaler Inhale 1-2 puffs into the lungs every 6 (six) hours as needed for wheezing or shortness of breath.   feeding supplement, GLUCERNA SHAKE, (GLUCERNA SHAKE) LIQD Take 237 mLs by mouth 3 (three) times daily between meals. (Patient taking differently: Take 237 mLs by mouth in the morning and at bedtime.)   hydrocortisone  cream 1 % Apply topically 3 (three) times daily. (Patient taking differently: Apply 1 Application topically 2 (two) times daily as needed for itching.)   latanoprost  (XALATAN ) 0.005 % ophthalmic solution Place 1 drop into both eyes at  bedtime.   dorzolamide -timolol  (COSOPT )  2-0.5 % ophthalmic solution Place 1 drop into both eyes 2 (two) times daily.   apixaban  (ELIQUIS ) 5 MG TABS tablet Take 1 tablet (5 mg total) by mouth 2 (two) times daily.   clopidogrel  (PLAVIX ) 75 MG tablet Take 1 tablet (75 mg total) by mouth daily.   isosorbide  mononitrate (IMDUR ) 30 MG 24 hr tablet Take 0.5 tablets (15 mg total) by mouth daily.   metFORMIN  (GLUCOPHAGE -XR) 500 MG 24 hr tablet Take 1 tablet (500 mg total) by mouth daily with breakfast.   metoprolol  succinate (TOPROL -XL) 25 MG 24 hr tablet Take 1 tablet (25 mg total) by mouth daily.   DULoxetine  (CYMBALTA ) 60 MG capsule Take 1 capsule (60 mg total) by mouth at bedtime.   ARIPiprazole  (ABILIFY ) 2 MG tablet Take 1 tablet (2 mg total) by mouth daily.   glipiZIDE  (GLUCOTROL ) 10 MG tablet TAKE 1 TABLET BY MOUTH TWICE DAILY   pantoprazole  (PROTONIX ) 40 MG tablet TAKE ONE (1) TABLET BY MOUTH TWICE DAILY   atorvastatin  (LIPITOR) 40 MG tablet TAKE 1 TABLET BY MOUTH DAILY EACH EVENING       12/27/2022    3:38 PM 12/03/2022    2:44 PM 08/23/2022    4:11 PM  Depression screen PHQ 2/9  Decreased Interest 0 0 0  Down, Depressed, Hopeless 2 0 0  PHQ - 2 Score 2 0 0  Altered sleeping 1  0  Tired, decreased energy 2  0  Change in appetite 0  0  Feeling bad or failure about yourself  2  0  Trouble concentrating 1  0  Moving slowly or fidgety/restless 2  0  Suicidal thoughts 1    PHQ-9 Score 11  0  Difficult doing work/chores Somewhat difficult      Flowsheet Row ED from 09/02/2023 in [facility] Most recent reading at 09/02/2023 11:05 PM ED from 09/02/2023 in [facility] Emergency Department at [facility] Most recent reading at 09/02/2023 10:40 AM ED from 09/02/2023 in [facility] Most recent reading at 09/02/2023  7:51 AM  C-SSRS RISK CATEGORY High Risk High Risk High Risk    Musculoskeletal  Strength & Muscle Tone: not  assessed - patient laying on lounger. Able to use walker to assist with ambulation  Gait & Station: per nursing staff, patient has steady gait when ambulating with walker Patient leans: N/A  Psychiatric Specialty Exam  Presentation  General Appearance:  Appropriate for Environment  Eye Contact: Good  Speech: Normal Rate; Clear and Coherent  Speech Volume: Normal  Handedness: Right   Mood and Affect  Mood: Euthymic  Affect: Appropriate   Thought Process  Thought Processes: Coherent  Descriptions of Associations:Intact  Orientation:Full (Time, Place and Person)  Thought Content:Logical  Diagnosis of Schizophrenia or Schizoaffective disorder in past: No    Hallucinations:Hallucinations: None  Ideas of Reference:None  Suicidal Thoughts:Suicidal Thoughts: No  Homicidal Thoughts:Homicidal Thoughts: No   Sensorium  Memory: Recent Fair; Remote Fair  Judgment: Fair  Insight: Fair   Art therapist  Concentration: Fair  Attention Span: Fair  Recall: Fair  Fund of Knowledge: Good  Language: Good   Psychomotor Activity  Psychomotor Activity: Psychomotor Activity: Normal   Assets  Assets: Communication Skills; Desire for Improvement   Sleep  Sleep: Sleep: Good  No Safety Checks orders active in given range  No data recorded  Physical Exam  Physical Exam ROS Blood pressure 124/70, pulse 72, temperature 97.7 F (36.5 C), resp. rate 18, SpO2  99%. There is no height or weight on file to calculate BMI.  Demographic Factors:  Caucasian and Low socioeconomic status  Loss Factors: Financial problems/change in socioeconomic status  Historical Factors: NA  Risk Reduction Factors:   Positive therapeutic relationship  Treatment Plan Summary: Daily contact with patient to assess and evaluate symptoms and progress in treatment   Medication management Patient has been continued on home meds Eliquis , Abilify , Lipitor, Cosopt ,  Cymbalta , Glucerna Shake, Glipizide , Insulin  with sliding scale, Imdur , Xalatan , Lopressor  Was started on Keflex  on 6/27 for UTI Agitation protocol    Plan Patient pending placement at skilled nursing facility (SNF) on Monday or Tuesday per [name], [facility]   [phone] Patient reported called to Dr Lenor at [facility] ED. Patient to be transported to the ED via [facility].   Disposition: Transfer to [facility] ED   Sherrell Culver, PMHNP-BC, FNP-BC  09/06/2023, 1:58 PM

## 2023-09-06 NOTE — ED Notes (Signed)
 Patient had 1 urine incontinence episode in lounger. Patient assisted to restroom, assisted with hygiene. Peri-care completed. Patient denied any burning or irritation with current urination in restroom. Patient changed into clean and dry clothes with minimal assistance. Patient bed cleaned and clean linens applied. Environment secured, safety checks in place per facility policy.

## 2023-09-06 NOTE — ED Triage Notes (Addendum)
 Pt presents for SNF placement and to follow-up on a recently diagnosed UTI.  Pt c/o continued lower abdominal w/ urination.  Pain score 8/10.    Pt was seen, discharged, and sent here by [facility] d/t the Pt being homeless and having nowhere to go.  Denies SI/HI.    Pt reports she was recently kicked out by her boyfriend.

## 2023-09-06 NOTE — ED Notes (Signed)
 Patient transferred to [facility] for medical clearance d/t abdominal pain. After Visit Summary (AVS) printed and placed with patient belongings. Patient discharged in no acute distress, A& Ox3 (disoriented to date) and ambulatory. Patient denied SI/HI, A/VH upon transfer. Patient mood fair. Patient belongings from locker #11 given to transport complete and intact. Patient escorted to back sallyport via staff for transport to destination. Safety maintained.

## 2023-09-06 NOTE — ED Notes (Signed)
 Patient resting in lounger with eyes closed, respirations even and unlabored. Patient in no apparent acute distress. Environment secured. Safety checks in place per facility protocol.

## 2023-09-06 NOTE — ED Notes (Signed)
 Patient reported that she needed to go to the ED. Patient reports reasoning is her uterus or bladder - something just ain't right. Provider Sherrell Culver, NP made aware.

## 2023-09-06 NOTE — ED Notes (Signed)
 PRN Kenalog  given due to patient reports of skin irritation on R arm. Medication administered with no complications. Environment secured, safety checks in place per facility policy.

## 2023-09-06 NOTE — Discharge Instructions (Signed)
 Transfer to [facility]ED

## 2023-09-06 NOTE — ED Notes (Signed)
 Patient reported self ambulation to restroom, denied need of assistance to restroom at this time.

## 2023-09-06 NOTE — ED Notes (Signed)
 Patient alert & oriented x4. Denies intent to harm self or others when asked. Denies A/VH. Patient reported irritation on R arm, writer assessed area. Skin reddened with small visible scratches. Provider Sherrell Culver, NP made aware. No acute distress noted. Scheduled and PRN medications administered with no complications. Support and encouragement provided. Patient is incontinent of urine and bowel, patient on a q2 hr toileting schedule, able to maintain continence on this schedule. Patient requiring minimal assist at most for toileting at this time. Patient uses a standard walker for ambulation. Patient was soiled with urine upon shift change but has had no episodes of incontinence since this time. Patient has a UTI, denies any current burning or irritation with urination at this time. Patient observed in milieu, no inappropriate behaviors noted, patient pleasant and respectful with Clinical research associate during interactions. Routine safety checks conducted per facility protocol. Encouraged patient to notify staff if any thoughts of harm towards self or others arise. Patient verbalizes understanding and agreement.

## 2023-09-06 NOTE — ED Notes (Signed)
 Patient tearful stating she's just depressed. Patient voices that she doesn't want to be at our facility any longer and that it has nothing to do with the staff but rather that it just sucks to be here. Support and encouragement provided. Environment secured, safety checks in place per facility policy.

## 2023-09-06 NOTE — ED Notes (Signed)
 Patient sleeping at this time.

## 2023-09-06 NOTE — ED Notes (Signed)
 Patient currently sleeping and resting in bed. Pt observed/assessed in recliner sleeping. RR even and unlabored, appearing in no noted distress. Environmental check complete

## 2023-09-07 NOTE — Progress Notes (Signed)
 TOC CSW received a consult for SNF placement. The pt does not qualify for short-term rehab and is currently homeless, with no discharge plan from short-term rehab. Per chart review, the pt still does not have Medicaid in place for long-term care placement. The pt has an open APS case, with APS worker Lewayne Louder [phone]). The CSW attempted to call Ms. Johnson to follow up on the plan for the pt, but received no response. DSS is closed today. The pt will need Medicaid in place to secure LTC placement. TOC will follow.    Tawni HERO.Luisdaniel Kenton, MSW, LCSW [facility]  Transitions of Care Clinical Social Worker II Direct Dial: [phone]  Fax: [phone] [email or web address]

## 2023-09-07 NOTE — ED Notes (Signed)
 Patient given apple juice and has no complaints at this time. Patient is in the bed resting.

## 2023-09-07 NOTE — ED Provider Notes (Signed)
 Emergency Medicine Observation Re-evaluation Note  MILICA GULLY is a 78 y.o. female, seen on rounds today.  Pt initially presented to the ED for complaints of SNF Placement and Dysuria Currently, the patient is in a hallway bed, has been eating and drinking.  Physical Exam  BP (!) 142/72 (BP Location: Right Arm)   Pulse 74   Temp (!) 97.4 F (36.3 C) (Axillary)   Resp 16   Ht 5' 3 (1.6 m)   Wt 61.2 kg   SpO2 98%   BMI 23.91 kg/m  Physical Exam General: Awake, alert Cardiac: Normal rate Lungs: Normal effort Psych: Patient continues to endorse suicidal ideation  ED Course / MDM  EKG:   I have reviewed the labs performed to date as well as medications administered while in observation.  Recent changes in the last 24 hours include medically cleared, currently awaiting for SNF placement.  Patient pleasant, somewhat of a sad situation.  She has a IT trainer for APS.  Patient reports she is currently homeless..  Plan  Current plan is for placement.SABRA Mannie Pac T, DO 09/07/23 1118

## 2023-09-07 NOTE — ED Notes (Signed)
 Pt ambulated to restroom via walker and one person assist.

## 2023-09-08 LAB — CBG MONITORING, ED: Glucose-Capillary: 345 mg/dL — ABNORMAL HIGH (ref 70–99)

## 2023-09-08 NOTE — Telephone Encounter (Signed)
 error

## 2023-09-08 NOTE — Progress Notes (Addendum)
 CSW attempted to contact pt's s/o, [name] Costa, and APS worker, Garrison Louder without success. Left HIPAA Compliant voicemail for both requesting a call back.  Addend @ 10:36AM Medicaid caseworker is [employer]. CSW attempted to contact without success- left HIPAA compliant voicemail requesting call back.   CSW spoke to Daphanie Johnson who informed she is unable to inform this Clinical research associate of any information other than there is an open investigation. Hospital leadership and [facility] leadership notified of case via secure email.    Addend @ 10:44AM CSW spoke with Mylinda Essex who informed pt currently has MQB Medicaid - which means DSS only pays her Medicare Part B premium. Per Mylinda, patient must be under reserve (less than $2,000 in assets). Patient must be placed into a facility before they can pursue LTC Medicaid. CSW requested EDP order PT eval.

## 2023-09-08 NOTE — Progress Notes (Signed)
 CSW outreached to PT to inquire about pt being evaluated today. Awaiting response.

## 2023-09-08 NOTE — Evaluation (Signed)
 Physical Therapy Evaluation Patient Details Name: STARKISHA TULLIS MRN: [id number] DOB: 1945/12/30 Today's Date: 09/08/2023  History of Present Illness  EPSIE WALTHALL is a 78 y.o. female presents 09/06/23 with dysuria, hyperglycemia, heat exposure. Presented  09/01/23 with dehydration, living in her car. Patient in ED on 6/24 due to UTI and d/c to  [facility] with SI and depression symptomPMH: CAD s/p CABG, diastolic CHF, chronic kidney disease stage III, diabetes, depression, DVT, afib  Clinical Impression  The patient is ambulating independently using the Rw. Patient reports that she has a Rw. Patient requesting return to her home with SO. RN in room. Patient does not require further  skilled PT at this time. PT will S/O         If plan is discharge home, recommend the following: Assistance with cooking/housework;Assist for transportation;Help with stairs or ramp for entrance   Can travel by private vehicle        Equipment Recommendations None recommended by PT  Recommendations for Other Services       Functional Status Assessment Patient has not had a recent decline in their functional status     Precautions / Restrictions Precautions Precautions: Fall Restrictions Weight Bearing Restrictions Per Provider Order: No      Mobility  Bed Mobility Overal bed mobility: Independent                  Transfers Overall transfer level: Modified independent                      Ambulation/Gait Ambulation/Gait assistance: Modified independent (Device/Increase time) Gait Distance (Feet): 100 Feet Assistive device: Rolling walker (2 wheels) Gait Pattern/deviations: Step-through pattern Gait velocity: decr     General Gait Details: gait slow and steady  Stairs            Wheelchair Mobility     Tilt Bed    Modified Rankin (Stroke Patients Only)       Balance Overall balance assessment: Mild deficits observed, not formally tested                                            Pertinent Vitals/Pain Pain Assessment Pain Assessment: 0-10 Pain Score: 8  Pain Location: back Pain Descriptors / Indicators: Aching Pain Intervention(s): RN gave pain meds during session    Home Living Family/patient expects to be discharged to:: Shelter/Homeless                   Additional Comments: states she can go back tpo the house with SO, LON in room    Prior Function Prior Level of Function : Needs assist             Mobility Comments: pt reports using RW ADLs Comments: pt reports ind with self care, [name] completes household chores     Extremity/Trunk Assessment   Upper Extremity Assessment Upper Extremity Assessment: Overall WFL for tasks assessed    Lower Extremity Assessment Lower Extremity Assessment: Overall WFL for tasks assessed    Cervical / Trunk Assessment Cervical / Trunk Assessment: Normal  Communication   Communication Communication: No apparent difficulties Factors Affecting Communication: Hearing impaired    Cognition Arousal: Alert Behavior During Therapy: WFL for tasks assessed/performed   PT - Cognitive impairments: No apparent impairments  PT - Cognition Comments: was off by a day Following commands: Intact       Cueing       General Comments General comments (skin integrity, edema, etc.): reports ambulating  to BR  independnetly with RW    Exercises     Assessment/Plan    PT Assessment Patient does not need any further PT services  PT Problem List         PT Treatment Interventions      PT Goals (Current goals can be found in the Care Plan section)  Acute Rehab PT Goals Patient Stated Goal: go to the house PT Goal Formulation: All assessment and education complete, DC therapy    Frequency       Co-evaluation               AM-PAC PT 6 Clicks Mobility  Outcome Measure Help needed turning from your back to your side while in  a flat bed without using bedrails?: None Help needed moving from lying on your back to sitting on the side of a flat bed without using bedrails?: None Help needed moving to and from a bed to a chair (including a wheelchair)?: None Help needed standing up from a chair using your arms (e.g., wheelchair or bedside chair)?: None Help needed to walk in hospital room?: None Help needed climbing 3-5 steps with a railing? : A Little 6 Click Score: 23    End of Session   Activity Tolerance: Patient tolerated treatment well Patient left: in bed;with call bell/phone within reach;with nursing/sitter in room Nurse Communication: Mobility status PT Visit Diagnosis: Muscle weakness (generalized) (M62.81)    Time: 8479-8464 PT Time Calculation (min) (ACUTE ONLY): 15 min   Charges:   PT Evaluation $PT Eval Low Complexity: 1 Low   PT General Charges $$ ACUTE PT VISIT: 1 Visit         Darice Potters PT Acute Rehabilitation Services Office [phone]   Potters Darice Norris 09/08/2023, 3:43 PM

## 2023-09-08 NOTE — Progress Notes (Signed)
 Pt walked 100 feet and per PT, no PT follow up needed. Will notify [facility] director who is pursuing SNF placement.

## 2023-09-08 NOTE — ED Notes (Signed)
Pt sleeping comfortably, Sitter at bedside

## 2023-09-08 NOTE — ED Provider Notes (Signed)
 Emergency Medicine Observation Re-evaluation Note  Maria Fox is a 78 y.o. female, seen on rounds today.  Pt initially presented to the ED for complaints of SNF Placement and Dysuria Currently, the patient is laying in bed.  Physical Exam  BP (!) 145/84 (BP Location: Right Arm)   Pulse 71   Temp 97.6 F (36.4 C) (Oral)   Resp 18   Ht 5' 3 (1.6 m)   Wt 61.2 kg   SpO2 98%   BMI 23.91 kg/m  Physical Exam General: No acute distress Cardiac: Normal rate Lungs: No increased work of breathing Psych: Calm  ED Course / MDM  EKG:   I have reviewed the labs performed to date as well as medications administered while in observation.  Recent changes in the last 24 hours include none.  Plan  Current plan is for awaiting TOC placement.    Lenor Hollering, MD 09/08/23 [phone]

## 2023-09-09 LAB — CBG MONITORING, ED: Glucose-Capillary: 305 mg/dL — ABNORMAL HIGH (ref 70–99)

## 2023-09-09 MED ORDER — CEPHALEXIN 500 MG PO CAPS: 500.0000 mg | ORAL_CAPSULE | Freq: Four times a day (QID) | ORAL | 0 refills | Status: AC

## 2023-09-09 NOTE — Discharge Instructions (Addendum)
 Please take the rest the antibiotics to finish out treating your urinary tract infection.  Please follow-up with behavioral health team as recommended by psychiatry.

## 2023-09-09 NOTE — Progress Notes (Signed)
 CSW spoke to [place], pt's s/o. [name] reported pt was not put out of her home but states she got up out of her [employer] and walked out the front door. CSW inquired about whether patient has been sleeping in a car and [name] responded I guess so cause she ain't been back in here.   Per [name], patient speaks with her brother often but states neither he nor the the patient can go on their property. [name] stated he spoke with pt's sister in law who stated she knows what she's doing. She knows how to play you.   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 [name] states he does manage her money because [place] can't even write a check. He states she pays $157 for a storage unit where she has her belongings, her mothers belongings, and a deceased aunt's belongings. He stated the patient also pays $83 for car insurance for a car she don't drive.  CSW will update leadership.

## 2023-09-09 NOTE — ED Provider Notes (Addendum)
 Emergency Medicine Observation Re-evaluation Note  KIMYA MCCAHILL is a 78 y.o. female, seen on rounds today.  Pt initially presented to the ED for complaints of SNF Placement and Dysuria Currently, the patient is asleep and resting without agitation.  Physical Exam  BP (!) 145/69 (BP Location: Left Arm)   Pulse (!) 120   Temp 98.4 F (36.9 C) (Oral)   Resp 16   Ht 5' 3 (1.6 m)   Wt 61.2 kg   SpO2 100%   BMI 23.91 kg/m  Physical Exam General: Resting without problems Lungs: Symmetric rise and fall of chest without respiratory distress Psych: No agitation at this time  ED Course / MDM  EKG:   I have reviewed the labs performed to date as well as medications administered while in observation.  Recent changes in the last 24 hours include none reported by overnight nursing.  Plan  Current plan is for awaiting placement.    Kouper Spinella, Lonni PARAS, MD 09/09/23 0740  12:03 PM Was informed that patient is being discharged.  Will switch over to discharge for outpatient follow-up and management.  Will print prescription for Keflex  to finish treating the UTI as an outpatient.    Dalia Jollie, Lonni PARAS, MD 09/09/23 [phone]

## 2023-09-09 NOTE — Progress Notes (Signed)
 CSW spoke with pt at bedside along with [place], RN. CSW informed PT determined no PT follow up after her evaluation. Pt stated she has spoken to [place] and he stated she can come back home and get her room back. Pt would like to return home at this time. Per PT eval, it is also noted that patient wishes to return home. After RN exited, CSW inquired about meals and patient stated [name] cooks all the time and states his food is good. She reported if he does not cook, he will buy them something.   CSW received call back from Tobias Dadds, [place] Caseworker who informed pt receives [phone] and is over the income limit. She reported the income limit is $1305.   CSW notified TOC Director, Christyne Sprang of the above information. [facility] will transport pt home as pt states she feels more comfortable with them.

## 2023-09-09 NOTE — ED Notes (Addendum)
 RN was asked to chaperone conversation with SW and patient; patient informed staff that Maria Fox stated she could return home and have her room back. She informed SW she wishes to return home rather than SNF.

## 2023-09-09 NOTE — Progress Notes (Signed)
 Per TOC Director, [facility] will close loop with [facility] and notify of discharge home.   Pt will discharge with prescription for Keflex . CSW notified [name] of the script and that it needed to be filled. [name] verbalized understanding. CSW also notified [name] that pt will arrive via [facility] within the hour.

## 2023-10-09 ENCOUNTER — Telehealth: Payer: Self-pay | Admitting: Family Medicine

## 2023-10-09 NOTE — Telephone Encounter (Signed)
 Copied from CRM [id number]. Topic: Medical Record Request - Records Request >> Oct 08, 2023  1:39 PM Carlyon D wrote: Reason for CRM: [place] calling in regards to some release of information paper work that was faxed over multiple times. She is resending the fax today.  DSS visited office yesterday, requesting to pick up copies of pt's recent AVS from visits & any chart notes of pt's diagnoses? Amy spoke with DSS worker & let her know the release of info form needed to be sent to Medical records, in order to receive the ppw they're requesting. FYI

## 2023-10-15 ENCOUNTER — Ambulatory Visit: Admitting: Family Medicine

## 2023-10-15 ENCOUNTER — Other Ambulatory Visit: Payer: Self-pay | Admitting: Family Medicine

## 2023-10-21 ENCOUNTER — Ambulatory Visit: Admitting: Family Medicine

## 2023-10-28 ENCOUNTER — Ambulatory Visit: Admitting: Family Medicine

## 2023-11-07 ENCOUNTER — Ambulatory Visit: Admitting: Family Medicine

## 2023-12-01 ENCOUNTER — Other Ambulatory Visit: Payer: Self-pay | Admitting: Family Medicine

## 2023-12-01 DIAGNOSIS — E1169 Type 2 diabetes mellitus with other specified complication: Secondary | ICD-10-CM

## 2023-12-04 NOTE — Telephone Encounter (Signed)
 ERx one 3 mo supply

## 2023-12-08 ENCOUNTER — Ambulatory Visit: Admitting: Family Medicine

## 2023-12-08 DIAGNOSIS — E1149 Type 2 diabetes mellitus with other diabetic neurological complication: Secondary | ICD-10-CM

## 2023-12-19 ENCOUNTER — Ambulatory Visit: Admitting: Family Medicine

## 2023-12-30 ENCOUNTER — Other Ambulatory Visit: Payer: Self-pay | Admitting: Family Medicine

## 2023-12-30 DIAGNOSIS — E1149 Type 2 diabetes mellitus with other diabetic neurological complication: Secondary | ICD-10-CM

## 2023-12-30 DIAGNOSIS — K219 Gastro-esophageal reflux disease without esophagitis: Secondary | ICD-10-CM

## 2023-12-31 NOTE — Telephone Encounter (Signed)
Please schedule OV as overdue.

## 2024-01-02 NOTE — Telephone Encounter (Signed)
 Scheduled for Monday 11.17.25 at 3pm with Dr. KANDICE

## 2024-01-26 ENCOUNTER — Ambulatory Visit (INDEPENDENT_AMBULATORY_CARE_PROVIDER_SITE_OTHER): Admitting: Family Medicine

## 2024-01-26 ENCOUNTER — Other Ambulatory Visit (HOSPITAL_COMMUNITY): Payer: Self-pay

## 2024-01-26 ENCOUNTER — Encounter: Payer: Self-pay | Admitting: Family Medicine

## 2024-01-26 VITALS — BP 110/82 | HR 94 | Temp 97.6°F | Ht 63.0 in | Wt 129.4 lb

## 2024-01-26 DIAGNOSIS — R300 Dysuria: Secondary | ICD-10-CM | POA: Diagnosis not present

## 2024-01-26 DIAGNOSIS — Z23 Encounter for immunization: Secondary | ICD-10-CM | POA: Diagnosis not present

## 2024-01-26 DIAGNOSIS — E1149 Type 2 diabetes mellitus with other diabetic neurological complication: Secondary | ICD-10-CM | POA: Diagnosis not present

## 2024-01-26 DIAGNOSIS — N390 Urinary tract infection, site not specified: Secondary | ICD-10-CM

## 2024-01-26 DIAGNOSIS — I480 Paroxysmal atrial fibrillation: Secondary | ICD-10-CM | POA: Diagnosis not present

## 2024-01-26 DIAGNOSIS — H401130 Primary open-angle glaucoma, bilateral, stage unspecified: Secondary | ICD-10-CM

## 2024-01-26 DIAGNOSIS — Z7984 Long term (current) use of oral hypoglycemic drugs: Secondary | ICD-10-CM

## 2024-01-26 DIAGNOSIS — N1831 Chronic kidney disease, stage 3a: Secondary | ICD-10-CM

## 2024-01-26 DIAGNOSIS — E785 Hyperlipidemia, unspecified: Secondary | ICD-10-CM

## 2024-01-26 DIAGNOSIS — E1122 Type 2 diabetes mellitus with diabetic chronic kidney disease: Secondary | ICD-10-CM

## 2024-01-26 DIAGNOSIS — Z79899 Other long term (current) drug therapy: Secondary | ICD-10-CM

## 2024-01-26 DIAGNOSIS — F333 Major depressive disorder, recurrent, severe with psychotic symptoms: Secondary | ICD-10-CM

## 2024-01-26 DIAGNOSIS — J45909 Unspecified asthma, uncomplicated: Secondary | ICD-10-CM

## 2024-01-26 DIAGNOSIS — E1169 Type 2 diabetes mellitus with other specified complication: Secondary | ICD-10-CM

## 2024-01-26 DIAGNOSIS — I4892 Unspecified atrial flutter: Secondary | ICD-10-CM

## 2024-01-26 LAB — POC URINALSYSI DIPSTICK (AUTOMATED)
Bilirubin, UA: NEGATIVE
Glucose, UA: POSITIVE — AB
Ketones, UA: NEGATIVE
Nitrite, UA: NEGATIVE
Spec Grav, UA: 1.02 (ref 1.010–1.025)
Urobilinogen, UA: 0.2 U/dL
pH, UA: 6 (ref 5.0–8.0)

## 2024-01-26 MED ORDER — ALBUTEROL SULFATE HFA 108 (90 BASE) MCG/ACT IN AERS
1.0000 | INHALATION_SPRAY | Freq: Four times a day (QID) | RESPIRATORY_TRACT | 3 refills | Status: AC | PRN
  Filled 2024-01-26: qty 6.7, 25d supply, fill #0

## 2024-01-26 MED ORDER — CEPHALEXIN 500 MG PO CAPS: 500.0000 mg | ORAL_CAPSULE | Freq: Two times a day (BID) | ORAL | 0 refills | Status: DC

## 2024-01-26 NOTE — Progress Notes (Unsigned)
 Ph: [phone] Fax: [phone]   Patient ID: Zelda LITTIE Novak, female    DOB: 1945-10-15, 78 y.o.   MRN: [id number]  This visit was conducted in person.  BP 110/82   Pulse 94   Temp 97.6 F (36.4 C) (Oral)   Ht 5' 3 (1.6 m)   Wt 129 lb 6 oz (58.7 kg)   SpO2 98%   BMI 22.92 kg/m   BP Readings from Last 3 Encounters:  01/26/24 110/82  09/09/23 (!) 145/69  09/02/23 (!) 148/75   CC: dysuria  Subjective:   HPI: MADI BONFIGLIO is a 78 y.o. female presenting on 01/26/2024 for Medical Management of Chronic Issues (Pt here for f/u /Pt also states she dysuria.)   Last seen 05/2023. Last hospitalization 06/2023, did not return for hosp f/u visit. Multiple ER visits in interim.   H/o recurrent UTI s/p hospitalizations.  Never followed up with [facility] - states she has been unable to make an appointment.  Again today with dysuria as well as suprapubic pain, no fevers/chills, nausea/vomiting, or flank pain.   She continues living with long time partner [name].  Still interested in moving to ALF - this was in process but no longer seems to be the case.   Only medications she is currently taking include: Atorvastatin  40mg  at bedtime Duloxetine  60mg  daily Glipizide  10mg  bid Pantoprazole  40mg  bid  She ran out of her glaucoma eye drops - was previously on dorzolamide -timolol  and latanoprost   - encouraged she reschedule appt with Dr Octavia  Denies recent hallucinations.  Mood up and down.  Only on duloxetine  60mg  daily.   DM - does not regularly check sugars. Compliant with antihyperglycemic regimen which includes: glipizide  10mg  bid with meals. Denies low sugars or hypoglycemic symptoms. Denies paresthesias, blurry vision. Last diabetic eye exam DUE. Glucometer brand: doesn't have. Last foot exam: today. DSME: completed remotely. Lab Results  Component Value Date   HGBA1C 8.7 (H) 06/13/2023   Diabetic Foot Exam - Simple   No data filed    No results found for:  MACKEY CURRENT      Relevant past medical, surgical, family and social history reviewed and updated as indicated. Interim medical history since our last visit reviewed. Allergies and medications reviewed and updated. Outpatient Medications Prior to Visit  Medication Sig Dispense Refill   atorvastatin  (LIPITOR) 40 MG tablet TAKE 1 TABLET BY MOUTH EVERY EVENING 90 tablet 0   DULoxetine  (CYMBALTA ) 60 MG capsule TAKE 1 CAPSULE BY MOUTH ONCE DAILY 90 capsule 0   glipiZIDE  (GLUCOTROL ) 10 MG tablet TAKE 1 TABLET BY MOUTH TWICE DAILY 60 tablet 3   pantoprazole  (PROTONIX ) 40 MG tablet TAKE 1 TABLET BY MOUTH TWICE DAILY 60 tablet 3   albuterol  (PROVENTIL ) (2.5 MG/3ML) 0.083% nebulizer solution Inhale 3 mLs into the lungs every 6 (six) hours as needed for wheezing or shortness of breath. (Patient not taking: Reported on 01/26/2024) 75 mL 12   clopidogrel  (PLAVIX ) 75 MG tablet Take 1 tablet (75 mg total) by mouth daily. (Patient not taking: Reported on 01/26/2024) 90 tablet 4   dorzolamide -timolol  (COSOPT ) 2-0.5 % ophthalmic solution Place 1 drop into both eyes 2 (two) times daily. (Patient not taking: Reported on 01/26/2024)     latanoprost  (XALATAN ) 0.005 % ophthalmic solution Place 1 drop into both eyes at bedtime. (Patient not taking: Reported on 01/26/2024) 2.5 mL 12   nitroGLYCERIN  (NITROSTAT ) 0.4 MG SL tablet Place 0.4 mg under the tongue every 5 (five) minutes as needed  for chest pain. (Patient not taking: Reported on 01/26/2024)     albuterol  (VENTOLIN  HFA) 108 (90 Base) MCG/ACT inhaler Inhale 1-2 puffs into the lungs every 6 (six) hours as needed for wheezing or shortness of breath. (Patient not taking: Reported on 01/26/2024) 6.7 g 3   apixaban  (ELIQUIS ) 5 MG TABS tablet Take 1 tablet (5 mg total) by mouth 2 (two) times daily. (Patient not taking: Reported on 01/26/2024) 60 tablet 6   ARIPiprazole  (ABILIFY ) 2 MG tablet Take 1 tablet (2 mg total) by mouth daily. (Patient not taking:  Reported on 01/26/2024) 60 tablet 0   feeding supplement, GLUCERNA SHAKE, (GLUCERNA SHAKE) LIQD Take 237 mLs by mouth 3 (three) times daily between meals. (Patient not taking: Reported on 01/26/2024) 30 mL 0   hydrocortisone  cream 1 % Apply topically 3 (three) times daily. (Patient not taking: Reported on 01/26/2024) 30 g 0   isosorbide  mononitrate (IMDUR ) 30 MG 24 hr tablet Take 0.5 tablets (15 mg total) by mouth daily. (Patient not taking: Reported on 01/26/2024) 45 tablet 4   metFORMIN  (GLUCOPHAGE -XR) 500 MG 24 hr tablet Take 1 tablet (500 mg total) by mouth daily with breakfast. (Patient not taking: Reported on 01/26/2024) 90 tablet 4   metoprolol  succinate (TOPROL -XL) 25 MG 24 hr tablet Take 1 tablet (25 mg total) by mouth daily. (Patient not taking: Reported on 01/26/2024) 90 tablet 4   No facility-administered medications prior to visit.     Per HPI unless specifically indicated in ROS section below Review of Systems  Objective:  BP 110/82   Pulse 94   Temp 97.6 F (36.4 C) (Oral)   Ht 5' 3 (1.6 m)   Wt 129 lb 6 oz (58.7 kg)   SpO2 98%   BMI 22.92 kg/m   Wt Readings from Last 3 Encounters:  01/26/24 129 lb 6 oz (58.7 kg)  09/06/23 135 lb (61.2 kg)  09/02/23 132 lb (59.9 kg)      Physical Exam Vitals and nursing note reviewed.  Constitutional:      Appearance: Normal appearance. She is not ill-appearing.  HENT:     Mouth/Throat:     Mouth: Mucous membranes are moist.     Pharynx: Oropharynx is clear. No oropharyngeal exudate or posterior oropharyngeal erythema.  Eyes:     Extraocular Movements: Extraocular movements intact.     Pupils: Pupils are equal, round, and reactive to light.  Neck:     Thyroid : No thyroid  mass or thyromegaly.  Cardiovascular:     Rate and Rhythm: Normal rate. Rhythm irregularly irregular.     Pulses: Normal pulses.     Heart sounds: No murmur heard. Pulmonary:     Effort: Pulmonary effort is normal. No respiratory distress.     Breath  sounds: No wheezing, rhonchi or rales.     Comments: RLL crackles - clears with cough  Abdominal:     General: Bowel sounds are normal. There is no distension.     Palpations: Abdomen is soft. There is no mass.     Tenderness: There is abdominal tenderness (mild) in the suprapubic area. There is no right CVA tenderness, left CVA tenderness, guarding or rebound. Negative signs include Murphy's sign.     Hernia: No hernia is present.  Musculoskeletal:     Cervical back: Normal range of motion and neck supple.     Right lower leg: No edema.     Left lower leg: No edema.  Skin:    General: Skin is warm  and dry.     Findings: No rash.     Comments: Vitiligo present  Neurological:     Mental Status: She is alert.       Results for orders placed or performed in visit on 01/26/24  POCT Urinalysis Dipstick (Automated)   Collection Time: 01/26/24  4:05 PM  Result Value Ref Range   Color, UA yellow    Clarity, UA cloudy    Glucose, UA Positive (A) Negative   Bilirubin, UA negative    Ketones, UA negative    Spec Grav, UA 1.020 1.010 - 1.025   Blood, UA trace    pH, UA 6.0 5.0 - 8.0   Protein, UA     Urobilinogen, UA 0.2 0.2 or 1.0 E.U./dL   Nitrite, UA negative    Leukocytes, UA Small (1+) (A) Negative   *Note: Due to a large number of results and/or encounters for the requested time period, some results have not been displayed. A complete set of results can be found in Results Review.  Micro: WBC TNTC RBC rare Bact 3+ diplococci Epi rare Casts none UCx sent  Lab Results  Component Value Date   NA 134 (L) 09/06/2023   CL 102 09/06/2023   K 5.0 09/06/2023   CO2 25 09/06/2023   BUN 22 09/06/2023   CREATININE 1.08 (H) 09/06/2023   GFRNONAA 52 (L) 09/06/2023   CALCIUM  9.6 09/06/2023   PHOS 2.8 06/15/2023   ALBUMIN 3.5 09/06/2023   GLUCOSE 194 (H) 09/06/2023        01/26/2024    3:22 PM 12/27/2022    3:38 PM 12/03/2022    2:44 PM 08/23/2022    4:11 PM 08/20/2022     2:20 PM  Depression screen PHQ 2/9  Decreased Interest 0 0 0 0 2  Down, Depressed, Hopeless 0 2 0 0 2  PHQ - 2 Score 0 2 0 0 4  Altered sleeping 0 1  0 1  Tired, decreased energy 0 2  0 1  Change in appetite 0 0  0 0  Feeling bad or failure about yourself  0 2  0 2  Trouble concentrating 0 1  0 1  Moving slowly or fidgety/restless 0 2  0 1  Suicidal thoughts 0 1   0  PHQ-9 Score 0 11   0  10   Difficult doing work/chores Not difficult at all Somewhat difficult   Somewhat difficult     Data saved with a previous flowsheet row definition       01/26/2024    3:24 PM 05/16/2023    1:31 AM 12/27/2022    3:39 PM 08/23/2022    4:11 PM  GAD 7 : Generalized Anxiety Score  Nervous, Anxious, on Edge 0 1 1 0  Control/stop worrying 0 2 2 0  Worry too much - different things 0 2 2 0  Trouble relaxing 0 1 1 0  Restless 0 1 1 0  Easily annoyed or irritable 0 2 2 0  Afraid - awful might happen 0 2 2 0  Total GAD 7 Score 0 11 11 0  Anxiety Difficulty Not difficult at all Somewhat difficult Somewhat difficult    EKG - sinus rhythm with PAC at rate 90s, RBBB with LAFB, largely unchanged   Assessment & Plan:   Problem List Items Addressed This Visit     Chronic kidney disease, stage 3a ([name]) (Chronic)   Update labs today.       Relevant  Orders   AMB Referral VBCI Care Management   Dyslipidemia associated with type 2 diabetes mellitus ([name])   Continue atorvastatin .      Relevant Orders   AMB Referral VBCI Care Management   Type 2 diabetes mellitus with neurological complications ([name])   Chronic, only on glipizide  10mg  bid with meals.  She is no longer taking metformin  She has refused insulin  or other injectable medications in the past.  Would avoid SGLT2i in recurrent UTIs Would avoid GLP1RA in ?h/o gastroparesis.       Relevant Orders   Comprehensive metabolic panel with GFR   Hemoglobin A1c   CBC with Differential/Platelet   AMB Referral VBCI Care Management   Paroxysmal  atrial fibrillation ([name])   H/o this. EKG today without afib but rather PAC.  She is currently off eliquis . Will refill to [facility] pharmacy and ask for pharmacy assistance.  CHADSVASC2 score = 8      Relevant Medications   apixaban  (ELIQUIS ) 5 MG TABS tablet   Other Relevant Orders   EKG 12-Lead   AMB Referral VBCI Care Management   Paroxysmal atrial flutter ([name])   Relevant Medications   apixaban  (ELIQUIS ) 5 MG TABS tablet   Other Relevant Orders   AMB Referral VBCI Care Management   POAG (primary open-angle glaucoma)   Ran out of eye drops, was on cosopt  and latanoprost .  Rec call to schedule ophtho f/u - sees Dr Octavia      Recurrent UTI   Refer back to [facility] - never seen in the past year       Relevant Medications   cephALEXin  (KEFLEX ) 500 MG capsule   Other Relevant Orders   Ambulatory referral to Urology   Reactive airway disease   Refill albuterol  inhaler.       Severe recurrent major depressive disorder with psychotic features ([name])   Continue duloxetine  60mg  daily, no longer on abilify  2mg .  No recent hallucinations. Overall stable period - continue duloxetine  alone.       Relevant Orders   AMB Referral VBCI Care Management   Polypharmacy   Only 1 box of prepackaged meds with 4 meds listed above which she receives from [facility] order pharmacy in [place] . She states she would be interested in establishing with prepackaged med pharmacy locally ie [facility].  Will refer to pharmacy team.       Relevant Orders   AMB Referral VBCI Care Management   Stage 3a chronic kidney disease due to type 2 diabetes mellitus ([name])   Update labs.      Relevant Orders   AMB Referral VBCI Care Management   Dysuria - Primary   UA /micro suspicious - start keflex , sent UCx.       Relevant Orders   POCT Urinalysis Dipstick (Automated) (Completed)   Urine Culture   Ambulatory referral to Urology   Other Visit Diagnoses       Encounter for immunization        Relevant Orders   Flu vaccine HIGH DOSE PF(Fluzone Trivalent) (Completed)        Meds ordered this encounter  Medications   albuterol  (VENTOLIN  HFA) 108 (90 Base) MCG/ACT inhaler    Sig: Inhale 1-2 puffs into the lungs every 6 (six) hours as needed for wheezing or shortness of breath.    Dispense:  6.7 g    Refill:  3   cephALEXin  (KEFLEX ) 500 MG capsule    Sig: Take 1 capsule (500 mg total) by mouth 2 (  two) times daily.    Dispense:  14 capsule    Refill:  0   apixaban  (ELIQUIS ) 5 MG TABS tablet    Sig: Take 1 tablet (5 mg total) by mouth 2 (two) times daily.    Dispense:  60 tablet    Refill:  6    Orders Placed This Encounter  Procedures   Urine Culture    Standing Status:   Future    Number of Occurrences:   1    Expiration Date:   02/25/2024   Flu vaccine HIGH DOSE PF(Fluzone Trivalent)   Comprehensive metabolic panel with GFR   Hemoglobin A1c   CBC with Differential/Platelet   Ambulatory referral to Urology    Referral Priority:   Routine    Referral Type:   Consultation    Referral Reason:   Specialty Services Required    Requested Specialty:   Urology    Number of Visits Requested:   1   AMB Referral VBCI Care Management    Referral Priority:   Routine    Referral Type:   Consultation    Referral Reason:   Care Coordination    Number of Visits Requested:   1   POCT Urinalysis Dipstick (Automated)   EKG 12-Lead    Patient Instructions  Flu shot today  I will ask pharmacy team to reach out for clarification on your medicines and to transfer prescriptions locally Schedule eye appointment with Dr Octavia and call him prior for eye drop refills.  Try plain vaseline to the lips.  Urine test today  Labs today EKG today  Return in 3 months for physical/wellness visit   Follow up plan: Return in about 3 months (around 04/27/2024), or if symptoms worsen or fail to improve, for annual exam, prior fasting for blood work, medicare wellness visit.  Anton Blas,  MD

## 2024-01-26 NOTE — Patient Instructions (Addendum)
 Flu shot today  I will ask pharmacy team to reach out for clarification on your medicines and to transfer prescriptions locally Schedule eye appointment with Dr Octavia and call him prior for eye drop refills.  Try plain vaseline to the lips.  Urine test today  Labs today EKG today  Return in 3 months for physical/wellness visit

## 2024-01-27 ENCOUNTER — Other Ambulatory Visit (HOSPITAL_COMMUNITY): Payer: Self-pay

## 2024-01-27 ENCOUNTER — Encounter: Payer: Self-pay | Admitting: Family Medicine

## 2024-01-27 ENCOUNTER — Other Ambulatory Visit: Payer: Self-pay

## 2024-01-27 DIAGNOSIS — R300 Dysuria: Secondary | ICD-10-CM | POA: Insufficient documentation

## 2024-01-27 LAB — COMPREHENSIVE METABOLIC PANEL WITH GFR
ALT: 7 U/L (ref 0–35)
AST: 11 U/L (ref 0–37)
Albumin: 4.1 g/dL (ref 3.5–5.2)
Alkaline Phosphatase: 77 U/L (ref 39–117)
BUN: 27 mg/dL — ABNORMAL HIGH (ref 6–23)
CO2: 24 meq/L (ref 19–32)
Calcium: 9 mg/dL (ref 8.4–10.5)
Chloride: 100 meq/L (ref 96–112)
Creatinine, Ser: 1.67 mg/dL — ABNORMAL HIGH (ref 0.40–1.20)
GFR: 28.69 mL/min — ABNORMAL LOW (ref >60.00)
Glucose, Bld: 212 mg/dL — ABNORMAL HIGH (ref 70–99)
Potassium: 4 meq/L (ref 3.5–5.1)
Sodium: 134 meq/L — ABNORMAL LOW (ref 135–145)
Total Bilirubin: 0.4 mg/dL (ref 0.2–1.2)
Total Protein: 7 g/dL (ref 6.0–8.3)

## 2024-01-27 LAB — CBC WITH DIFFERENTIAL/PLATELET
Basophils Absolute: 0 K/uL (ref 0.0–0.1)
Basophils Relative: 0.6 % (ref 0.0–3.0)
Eosinophils Absolute: 0.3 K/uL (ref 0.0–0.7)
Eosinophils Relative: 4.1 % (ref 0.0–5.0)
HCT: 35.8 % — ABNORMAL LOW (ref 36.0–46.0)
Hemoglobin: 11.8 g/dL — ABNORMAL LOW (ref 12.0–15.0)
Lymphocytes Relative: 31.7 % (ref 12.0–46.0)
Lymphs Abs: 2.4 K/uL (ref 0.7–4.0)
MCHC: 32.9 g/dL (ref 30.0–36.0)
MCV: 86.7 fl (ref 78.0–100.0)
Monocytes Absolute: 0.5 K/uL (ref 0.1–1.0)
Monocytes Relative: 7.3 % (ref 3.0–12.0)
Neutro Abs: 4.2 K/uL (ref 1.4–7.7)
Neutrophils Relative %: 56.3 % (ref 43.0–77.0)
Platelets: 243 K/uL (ref 150.0–400.0)
RBC: 4.13 Mil/uL (ref 3.87–5.11)
RDW: 14 % (ref 11.5–15.5)
WBC: 7.5 K/uL (ref 4.0–10.5)

## 2024-01-27 LAB — HEMOGLOBIN A1C: Hgb A1c MFr Bld: 10.3 % — ABNORMAL HIGH (ref 4.6–6.5)

## 2024-01-27 MED ORDER — APIXABAN 5 MG PO TABS
5.0000 mg | ORAL_TABLET | Freq: Two times a day (BID) | ORAL | 6 refills | Status: AC
  Filled 2024-01-27: qty 60, 30d supply, fill #0

## 2024-01-27 NOTE — Assessment & Plan Note (Signed)
 Ran out of eye drops, was on cosopt  and latanoprost .  Rec call to schedule ophtho f/u - sees Dr Octavia

## 2024-01-27 NOTE — Assessment & Plan Note (Signed)
 Continue atorvastatin 

## 2024-01-27 NOTE — Assessment & Plan Note (Signed)
 Update labs today

## 2024-01-27 NOTE — Assessment & Plan Note (Signed)
 UA /micro suspicious - start keflex , sent UCx.

## 2024-01-27 NOTE — Assessment & Plan Note (Signed)
 Refill albuterol inhaler ?

## 2024-01-27 NOTE — Assessment & Plan Note (Signed)
 Chronic, only on glipizide  10mg  bid with meals.  She is no longer taking metformin  She has refused insulin  or other injectable medications in the past.  Would avoid SGLT2i in recurrent UTIs Would avoid GLP1RA in ?h/o gastroparesis.

## 2024-01-27 NOTE — Assessment & Plan Note (Addendum)
 Continue duloxetine  60mg  daily, no longer on abilify  2mg .  No recent hallucinations. Overall stable period - continue duloxetine  alone.

## 2024-01-27 NOTE — Assessment & Plan Note (Addendum)
 Refer back to [facility] - never seen in the past year

## 2024-01-27 NOTE — Assessment & Plan Note (Signed)
 Update labs.

## 2024-01-27 NOTE — Assessment & Plan Note (Addendum)
 H/o this. EKG today without afib but rather PAC.  She is currently off eliquis . Will refill to [facility] pharmacy and ask for pharmacy assistance.  CHADSVASC2 score = 8

## 2024-01-27 NOTE — Assessment & Plan Note (Signed)
 Only 1 box of prepackaged meds with 4 meds listed above which she receives from [facility] order pharmacy in [place] . She states she would be interested in establishing with prepackaged med pharmacy locally ie [facility].  Will refer to pharmacy team.

## 2024-01-28 ENCOUNTER — Ambulatory Visit: Payer: Self-pay | Admitting: Family Medicine

## 2024-01-28 LAB — URINE CULTURE
MICRO NUMBER:: 17244169
SPECIMEN QUALITY:: ADEQUATE

## 2024-01-28 MED ORDER — NITROFURANTOIN MONOHYD MACRO 100 MG PO CAPS: 100.0000 mg | ORAL_CAPSULE | Freq: Two times a day (BID) | ORAL | 0 refills | Status: DC

## 2024-01-29 ENCOUNTER — Other Ambulatory Visit (HOSPITAL_COMMUNITY): Payer: Self-pay

## 2024-02-02 ENCOUNTER — Telehealth: Payer: Self-pay

## 2024-02-02 NOTE — Telephone Encounter (Signed)
 Copied from CRM #[id number]. Topic: Clinical - Medication Question >> Feb 02, 2024  1:29 PM Alfonso HERO wrote: Reason for CRM: patient calling saying she is out of some of her medications and she doesn't know the names. She is asking for someone to call her back to help her out in order for them to get refilled hoping the nurse would know the names.

## 2024-02-02 NOTE — Progress Notes (Signed)
 Complex Care Management Note  Care Guide Note 02/02/2024 Name: ALAISHA EVERSLEY MRN: [id number] DOB: Jul 01, 1945  SHERESE HEYWARD is a 78 y.o. year old female who sees Rilla Baller, MD for primary care. I reached out to Zelda LITTIE Novak by phone today to offer complex care management services.  Ms. Voisin was given information about Complex Care Management services today including:   The Complex Care Management services include support from the care team which includes your Nurse Care Manager, Clinical Social Worker, or Pharmacist.  The Complex Care Management team is here to help remove barriers to the health concerns and goals most important to you. Complex Care Management services are voluntary, and the patient may decline or stop services at any time by request to their care team member.   Complex Care Management Consent Status: Patient agreed to services and verbal consent obtained.   Follow up plan:  Telephone appointment with complex care management team member scheduled for:  02/04/24 at 3:00 p.m.   Encounter Outcome:  Patient Scheduled  Dreama [facility]  [facility] VBCI Assistant Direct Dial: [phone]  Fax: [phone]

## 2024-02-03 ENCOUNTER — Ambulatory Visit: Payer: Self-pay

## 2024-02-03 ENCOUNTER — Other Ambulatory Visit: Payer: Self-pay | Admitting: Family Medicine

## 2024-02-03 MED ORDER — ONDANSETRON HCL 4 MG PO TABS: 4.0000 mg | ORAL_TABLET | Freq: Three times a day (TID) | ORAL | 0 refills | Status: AC | PRN

## 2024-02-03 NOTE — Telephone Encounter (Signed)
 FYI Only or Action Required?: Action required by provider: clinical question for provider and update on patient condition.  Patient was last seen in primary care on 01/26/2024 by Rilla Baller, MD.  Called Nurse Triage reporting Nausea.  Symptoms began 2 days ago.  Symptoms are: unchanged.  Triage Disposition: Home Care  Patient/caregiver understands and will follow disposition?: Yes       Summary: wakes up nauseated every morning   Reason for Triage: Patient wakes up nauseated every morning, started on Sunday and would like to have prescription sent to    [id number] -  [place], [place] - [address] AT [facility] [address] [place] [address] Phone: [phone] Fax: [phone] Hours: Open 24 hours            Reason for Disposition  Unexplained nausea  Answer Assessment - Initial Assessment Questions Patient states she did not want to make an appointment and would like to see if a prescription for nausea medication could be prescribed. Please advise.      1. NAUSEA SEVERITY: How bad is the nausea? (e.g., mild, moderate, severe; dehydration, weight loss)     Mild  2. ONSET: When did the nausea begin?     2 days ago  3. VOMITING: Any vomiting? If Yes, ask: How many times today?     Small amount in the morning yesterday and the day before.  4. RECURRENT SYMPTOM: Have you had nausea before? If Yes, ask: When was the last time? What happened that time?     Yes 5. CAUSE: What do you think is causing the nausea?     Unsure  Protocols used: Nausea-A-AH

## 2024-02-03 NOTE — Progress Notes (Signed)
 ERx zofran  for nausea

## 2024-02-03 NOTE — Telephone Encounter (Addendum)
 ERx zofran  for nausea.  She was recently prescribed macrobid  for UTI - did nausea start with macrobid  antibiotic? If abd pain, fever, or nausea/vomiting persists, rec OV for evaluation.

## 2024-02-04 ENCOUNTER — Other Ambulatory Visit: Admitting: Pharmacist

## 2024-02-04 DIAGNOSIS — I10 Essential (primary) hypertension: Secondary | ICD-10-CM

## 2024-02-04 NOTE — Telephone Encounter (Signed)
 Left message to return call to office.

## 2024-02-04 NOTE — Patient Instructions (Signed)
 [name],   It was a pleasure to speak with you today! As we discussed:?   Continue all medications as you have been taking them.  The blood thinner (clopidogrel /Plavix ) is usually taken long-term after the type of heart surgery that you had. It does appear Dr. KANDICE just sent this refill when you saw him last. If you are not taking it, you may resume. Avoid aspirin  or pain medications such as Advil , Aleve while taking the blood thinner.    Please reach out prior to your next scheduled appointment should you have any questions or concerns.   If you have any questions, you may leave me a voicemail at [phone] and I will get back to you shortly.   Thank you!   Future Appointments  Date Time Provider Department Center  04/09/2024  3:00 PM [facility] [place] VISIT 1 [facility] [address]  04/23/2024  3:45 PM [facility] [address]  04/30/2024  3:30 PM Rilla Baller, MD [facility] [address]    Manuelita FABIENE Kobs, PharmD Clinical Pharmacist [facility] [phone]

## 2024-02-04 NOTE — Progress Notes (Signed)
 02/04/2024 Name: Maria Fox MRN: [id number] DOB: 19-Apr-1945  Subjective  Chief Complaint  Patient presents with   Medication Access   Care Team: Primary Care Provider: Rilla Baller, MD  Reason for visit: Maria Fox is a 78 y.o. female who presents today for a phone visit with the pharmacist. They were referred by their PCP for assistance in setting up local mail order pharmacy for drugs  Medication Access/Adherence: ?  Prescription drug coverage: Payor: [employer] MEDICARE / Plan: DREMA DUAL COMPLETE / Product Type: *No Product type* / .   - Reports that all medications are affordable: Yes  - Medication packaging: Pill bottles (does not use adherence packaging) - Patient manages their own medications/refills: Yes . Also has support from her husband   Outpatient Encounter Medications as of 02/04/2024  Medication Sig Note   albuterol  (VENTOLIN  HFA) 108 (90 Base) MCG/ACT inhaler Inhale 1-2 puffs into the lungs every 6 (six) hours as needed for wheezing or shortness of breath. Taking   apixaban  (ELIQUIS ) 5 MG TABS tablet Take 1 tablet (5 mg total) by mouth 2 (two) times daily. Taking   atorvastatin  (LIPITOR) 40 MG tablet TAKE 1 TABLET BY MOUTH EVERY EVENING Taking   clopidogrel  (PLAVIX ) 75 MG tablet Take 1 tablet (75 mg total) by mouth daily. (Patient not taking: Reported on 01/26/2024) Not taking. Was unsure if she should be.   dorzolamide -timolol  (COSOPT ) 2-0.5 % ophthalmic solution Place 1 drop into both eyes 2 (two) times daily. (Patient not taking: Reported on 01/26/2024) Plans to schedule follow up w Eye dr.    [name]  (CYMBALTA ) 60 MG capsule TAKE 1 CAPSULE BY MOUTH ONCE DAILY Taking   glipiZIDE  (GLUCOTROL ) 10 MG tablet TAKE 1 TABLET BY MOUTH TWICE DAILY Taking   latanoprost  (XALATAN ) 0.005 % ophthalmic solution Place 1 drop into both eyes at bedtime. (Patient not taking: Reported on 01/26/2024) Plans to schedule follow up w Eye dr.    nitroGLYCERIN   (NITROSTAT ) 0.4 MG SL tablet Place 0.4 mg under the tongue every 5 (five) minutes as needed for chest pain. (Patient not taking: Reported on 01/26/2024) Has on hand if needed.    ondansetron  (ZOFRAN ) 4 MG tablet Take 1 tablet (4 mg total) by mouth every 8 (eight) hours as needed for nausea or vomiting. Has PRN   pantoprazole  (PROTONIX ) 40 MG tablet TAKE 1 TABLET BY MOUTH TWICE DAILY Taking   [DISCONTINUED] albuterol  (PROVENTIL ) (2.5 MG/3ML) 0.083% nebulizer solution Inhale 3 mLs into the lungs every 6 (six) hours as needed for wheezing or shortness of breath. (Patient not taking: Reported on 01/26/2024) Handheld inhaler only   [DISCONTINUED] nitrofurantoin , macrocrystal-monohydrate, (MACROBID ) 100 MG capsule Take 1 capsule (100 mg total) by mouth 2 (two) times daily for 7 days. Course complete today.    No facility-administered encounter medications on file as of 02/04/2024.    Assessment and Plan:   Medication Access All medications were reviewed with patient today and medication list was updated as appropriate.  Not taking antiplatelet. No f/u with cardiology. Patient does plan to call and schedule follow up. Discussed with her that antiplatelet therapy is usually life-Fox after CABG (e.g. clopidogrel  OR aspirin ). clopidogrel  refill recently sent by PCP.  Home Delivery through [facility]  Discussed process of home delivery through [facility] with patient today.  Also mailed patient contact information and home delivery information (does not use MyChart).  Maria Fox messaged regarding patient preferences for future home delivery    Future Appointments  Date Time Provider Department  Center  04/09/2024  3:00 PM [facility] ANNUAL WELLNESS VISIT 1 [facility] [address]  04/23/2024  3:45 PM [facility] LAB [facility] [address]  04/30/2024  3:30 PM Maria Baller, MD [facility] [address]    Maria Fox Maria Fox, PharmD Clinical Pharmacist [facility] [phone]

## 2024-02-04 NOTE — Telephone Encounter (Signed)
 Left message to return call to office. Ok to capital one below.   There are multiple open encounters on this patient. Please make sure she receives all messages.

## 2024-02-09 ENCOUNTER — Ambulatory Visit: Payer: Self-pay

## 2024-02-09 MED ORDER — NITROFURANTOIN MONOHYD MACRO 100 MG PO CAPS: 100.0000 mg | ORAL_CAPSULE | Freq: Two times a day (BID) | ORAL | 0 refills | Status: AC

## 2024-02-09 MED ORDER — INSULIN GLARGINE 100 UNIT/ML SOLOSTAR PEN: 10.0000 [IU] | PEN_INJECTOR | Freq: Every day | SUBCUTANEOUS | 0 refills | Status: AC

## 2024-02-09 NOTE — Telephone Encounter (Unsigned)
 Copied from CRM #[id number]. Topic: Clinical - Red Word Triage >> Feb 09, 2024 11:26 AM Gustabo D wrote: Pt been taking medication for a week and is still having Uti symptoms says it still hurts when she pee. She says the medicine helped but still having problems and she is out. >> Feb 09, 2024  1:24 PM Robinson H wrote: Patient calling back to see if provider called any medication in for her for the UTI. Patient wants to know if provider will [facility] [address]  [place] [phone]   Spoke to patient reviewed all information. She feels like she will be fine doing injections with no further teaching or education. If she changes her mind she will give us  a call.

## 2024-02-09 NOTE — Telephone Encounter (Signed)
 FYI Only or Action Required?: Action required by provider: clinical question for provider.  Patient was last seen in primary care on 01/26/2024 by Rilla Baller, MD.  Called Nurse Triage reporting Dysuria.  Symptoms began several weeks ago.  Interventions attempted: Prescription medications: Macrobid .  Symptoms are: gradually improving.  Triage Disposition: Home Care  Patient/caregiver understands and will follow disposition?: Yes Reason for Disposition  [1] Reasonable improvement on antibiotics AND [2] no fever  Answer Assessment - Initial Assessment Questions Patient finished abx and is still experiencing some pain with urination, mostly when she is finishing up voiding. Please advise.  1. MAIN SYMPTOM: What is the main symptom you are concerned about? (e.g., painful urination, urine frequency)     Painful urination  2. BETTER-SAME-WORSE: Are you getting better, staying the same, or getting worse compared to how you felt at your last visit to the doctor (most recent medical visit)?     Somewhat better  3. PAIN: How bad is the pain?  (e.g., Scale 1-10; mild, moderate, or severe)     Hurts the most when getting to the end of voiding  4. FEVER: Do you have a fever? If Yes, ask: What is it, how was it measured, and when did it start?     Denies  5. OTHER SYMPTOMS: Do you have any other symptoms? (e.g., blood in the urine, flank pain, vaginal discharge)     Denies  6. DIAGNOSIS: When was the UTI diagnosed? By whom? Was it a kidney infection, bladder infection or both?     PCP  7. ANTIBIOTIC: What antibiotic(s) are you taking? How many times per day?      macrobid  twice daily for 1 week.   8. ANTIBIOTIC - START DATE: When did you start taking the antibiotic?     01/28/24  Protocols used: Urinary Tract Infection on Antibiotic Follow-up Call - [facility]  Copied from CRM #[id number]. Topic: Clinical - Red Word Triage >> Feb 09, 2024 11:26 AM Gustabo  D wrote: Pt been taking medication for a week and is still having Uti symptoms says it still hurts when she pee. She says the medicine helped but still having problems and she is out.

## 2024-02-09 NOTE — Telephone Encounter (Signed)
 Plz notify I have extended macrobid  antibiotics to 1 more week.  I do recommend urology f/u for recurrent UTIs - referral placed to [facility] on 01/27/2024.   Also plz notify I've sent in once daily insulin  lantus  injections to start at 10u once daily. She may schedule nurse visit or pharmacy visit for insulin  teaching as this is new insulin  start.  Update us  if any trouble with insulin  shots.   Lab Results  Component Value Date   HGBA1C 10.3 (H) 01/26/2024

## 2024-02-09 NOTE — Telephone Encounter (Signed)
 ERx insulin 

## 2024-02-10 NOTE — Telephone Encounter (Signed)
 Patient would like to get meds from [facility] is this something  you can help with or do we need to reach out to someone there?   Patient has not heard back from referral to urology. Please advised.

## 2024-02-10 NOTE — Telephone Encounter (Signed)
 Spoke to patient she started symptoms with antibiotics. They have resolved with Zofran . If any  abd pain, fever, or nausea/vomiting that  persists, she will call for appointment.

## 2024-02-11 ENCOUNTER — Telehealth: Payer: Self-pay | Admitting: Family Medicine

## 2024-02-11 NOTE — Telephone Encounter (Signed)
 Copied from CRM #[id number]. Topic: General - Other >> Feb 11, 2024 10:05 AM Aleatha C wrote: Reason for CRM: Patient wanted inform office to disregard message about [facility], and to send all medications [facility]   [id number] [place] [place] - [address] AT [facility] [address]  [place] [address] Phone: [phone] Fax: [phone] Hours: Open 24 hours

## 2024-02-11 NOTE — Telephone Encounter (Signed)
 See subsequent phone note from today - [facility] is [facility]?

## 2024-02-13 ENCOUNTER — Telehealth: Payer: Self-pay | Admitting: Family Medicine

## 2024-02-13 NOTE — Telephone Encounter (Signed)
 Copied from CRM #[id number]. Topic: Clinical - Medication Refill >> Feb 13, 2024 11:26 AM Alfonso ORN wrote: Medication: pt wants all medication needed , pt is unsure of what's needed  Has the patient contacted their pharmacy? No   This is the patient's preferred pharmacy:  [id number] [place], [place] - [address] AT [facility] [address] [place] [address] Phone: [phone] Fax: [phone]   Is this the correct pharmacy for this prescription? Yes If no, delete pharmacy and type the correct one.   Has the prescription been filled recently? No  Is the patient out of the medication? Yes  Has the patient been seen for an appointment in the last year OR does the patient have an upcoming appointment?Yes  Can we respond through MyChart? No  Agent: Please be advised that Rx refills may take up to 3 business days. We ask that you follow-up with your pharmacy.

## 2024-02-18 ENCOUNTER — Other Ambulatory Visit: Payer: Self-pay | Admitting: Family Medicine

## 2024-02-19 ENCOUNTER — Telehealth: Payer: Self-pay | Admitting: Pharmacist

## 2024-02-19 NOTE — Progress Notes (Signed)
 Brief Telephone Documentation Reason for Call: Patient left message regarding question for pharmacist   Summary of Call: Called patient. No answer, left voicemail with direct callback number.   Previously discussed [facility] delivery of [facility] through [facility], though chart notes shortly after indicate patient changed her mind and [facility]   Follow Up: Patient given direct line for further questions/concerns.  Maria Fox, PharmD Clinical Pharmacist [facility] [phone]

## 2024-02-24 ENCOUNTER — Telehealth: Payer: Self-pay

## 2024-02-24 NOTE — Telephone Encounter (Signed)
 Copied from CRM #[id number]. Topic: Clinical - Prescription Issue >> Feb 24, 2024  2:26 PM Alfonso ORN wrote: Reason for CRM: pt called to f/u on rx refill requests. Advised [facility] called to clarify pharmacy as : Previously discussed home delivery of medications through [facility], though chart notes shortly after indicate patient changed her mind and [facility]   Pt : [facility] , UTI medication needed as well. Please f/u with pt for update [phone]

## 2024-02-25 ENCOUNTER — Telehealth: Payer: Self-pay | Admitting: Pharmacist

## 2024-02-25 NOTE — Progress Notes (Addendum)
 Brief Telephone Documentation Reason for Call: Patient question regarding refills  Summary of Call: Called patient, no answer. Unable to leave voicemail.  Will attempt again later today

## 2024-02-25 NOTE — Progress Notes (Signed)
 Call attempt #2 2:30pm. Neither number in Epic has option for voicemail. Both ring and result in error.

## 2024-02-27 ENCOUNTER — Other Ambulatory Visit: Payer: Self-pay | Admitting: Family Medicine

## 2024-02-27 DIAGNOSIS — E785 Hyperlipidemia, unspecified: Secondary | ICD-10-CM

## 2024-02-27 DIAGNOSIS — E1169 Type 2 diabetes mellitus with other specified complication: Secondary | ICD-10-CM

## 2024-02-27 DIAGNOSIS — F333 Major depressive disorder, recurrent, severe with psychotic symptoms: Secondary | ICD-10-CM

## 2024-03-10 ENCOUNTER — Other Ambulatory Visit: Payer: Self-pay | Admitting: Family Medicine

## 2024-03-10 DIAGNOSIS — F333 Major depressive disorder, recurrent, severe with psychotic symptoms: Secondary | ICD-10-CM

## 2024-03-10 DIAGNOSIS — E1169 Type 2 diabetes mellitus with other specified complication: Secondary | ICD-10-CM

## 2024-03-12 ENCOUNTER — Other Ambulatory Visit: Payer: Self-pay | Admitting: Family Medicine

## 2024-03-12 DIAGNOSIS — F333 Major depressive disorder, recurrent, severe with psychotic symptoms: Secondary | ICD-10-CM

## 2024-03-15 NOTE — Telephone Encounter (Signed)
 Please verify with patient where she wants refills sent - to , local [facility], or [facility] pharmacy?

## 2024-03-16 MED ORDER — DULOXETINE HCL 60 MG PO CPEP: 60.0000 mg | ORAL_CAPSULE | Freq: Every day | ORAL | 1 refills | Status: AC

## 2024-03-16 NOTE — Telephone Encounter (Signed)
 Called [facility] [address]

## 2024-03-17 NOTE — Telephone Encounter (Unsigned)
 Copied from CRM [id number]. Topic: Clinical - Prescription Issue >> Mar 17, 2024  1:27 PM Rea ORN wrote: Reason for CRM: [facility] [address] [facility]. I advise the Cymbalta  was called there and she said they told her they do not have it.   Please call back to advise,  [phone]

## 2024-03-19 ENCOUNTER — Other Ambulatory Visit: Payer: Self-pay | Admitting: Family Medicine

## 2024-03-19 DIAGNOSIS — E1169 Type 2 diabetes mellitus with other specified complication: Secondary | ICD-10-CM

## 2024-03-19 MED ORDER — ATORVASTATIN CALCIUM 40 MG PO TABS: 40.0000 mg | ORAL_TABLET | Freq: Every day | ORAL | 0 refills | Status: AC

## 2024-03-19 NOTE — Telephone Encounter (Signed)
 Copied from CRM #[id number]. Topic: Clinical - Medication Refill >> Mar 19, 2024  2:48 PM Delon T wrote: Medication: atorvastatin  (LIPITOR) 40 MG tablet- need for February box   Has the patient contacted their pharmacy? Yes (Agent: If no, request that the patient contact the pharmacy for the refill. If patient does not wish to contact the pharmacy document the reason why and proceed with request.) (Agent: If yes, when and what did the pharmacy advise?)  This is the patient's preferred pharmacy:    [facility] [place]  [place], [place] - [address] [place] [address] Phone: [phone] Fax: [phone]  Is this the correct pharmacy for this prescription? Yes If no, delete pharmacy and type the correct one.   Has the prescription been filled recently? Yes  Is the patient out of the medication? No  Has the patient been seen for an appointment in the last year OR does the patient have an upcoming appointment? Yes  Can we respond through MyChart? Yes  Agent: Please be advised that Rx refills may take up to 3 business days. We ask that you follow-up with your pharmacy.

## 2024-03-23 ENCOUNTER — Ambulatory Visit: Payer: Self-pay

## 2024-03-23 NOTE — Telephone Encounter (Signed)
" °  FYI Only or Action Required?: FYI only for provider: UC advised.  Patient was last seen in primary care on 01/26/2024 by Rilla Baller, MD.  Called Nurse Triage reporting Dysuria.  Symptoms began several weeks ago.  Interventions attempted: Nothing.  Symptoms are: gradually worsening.  Triage Disposition: See HCP Within 4 Hours (Or PCP Triage)  Patient/caregiver understands and will follow disposition?: Yes   Copied from CRM [id number]. Topic: Clinical - Red Word Triage >> Maria Fox, Maria Fox  1:24 PM Maria Fox wrote: Red Word that prompted transfer to Nurse Triage: Pt called in stating that she is having burning with urination. Denied any other symptoms. Warm transferred to nurse triage. Reason for Disposition  Diabetes mellitus or weak immune system (e.g., HIV positive, cancer chemo, splenectomy, organ transplant, chronic steroids)  Answer Assessment - Initial Assessment Questions Pain with urination x 2 weeks. Pain is 7/8 out of 10 states she would like Dr. KANDICE to just call her in a script. RN advised last time they had to switch her medication due to meds not covering the infection she had, she agreed. RN advised UC as no appts available today, length of time with symptoms and hx of mutliple utis. Pt is agreeable to go.    1. SEVERITY: How bad is the pain?  (e.g., Scale 1-10; mild, moderate, or severe)     7/8 2. FREQUENCY: How many times have you had painful urination today?      Every time 3. PATTERN: Is pain present every time you urinate or just sometimes?      yes 4. ONSET: When did the painful urination start?      About 2 weeks ago 5. FEVER: Do you have a fever? If Yes, ask: What is your temperature, how was it measured, and when did it start?     denies 6. PAST UTI: Have you had a urine infection before? If Yes, ask: When was the last time? and What happened that time?      Yes, macrobid , in November 7. CAUSE: What do you think is causing the painful  urination?  (e.g., UTI, scratch, Herpes sore)      8. OTHER SYMPTOMS: Do you have any other symptoms? (e.g., blood in urine, flank pain, genital sores, urgency, vaginal discharge)     Frequency, denies all other symptoms  Protocols used: Urination Pain - Female-A-AH  "

## 2024-04-09 ENCOUNTER — Ambulatory Visit

## 2024-04-23 ENCOUNTER — Other Ambulatory Visit

## 2024-04-30 ENCOUNTER — Encounter: Admitting: Family Medicine
